# Patient Record
Sex: Female | Born: 1970
Health system: Southern US, Community
[De-identification: ages and names within clinical notes are randomized; demographics above are authoritative.]

## PROBLEM LIST (undated history)

## (undated) DIAGNOSIS — K219 Gastro-esophageal reflux disease without esophagitis: Secondary | ICD-10-CM

## (undated) DIAGNOSIS — H269 Unspecified cataract: Secondary | ICD-10-CM

## (undated) DIAGNOSIS — I10 Essential (primary) hypertension: Secondary | ICD-10-CM

## (undated) DIAGNOSIS — N186 End stage renal disease: Secondary | ICD-10-CM

## (undated) DIAGNOSIS — I639 Cerebral infarction, unspecified: Secondary | ICD-10-CM

## (undated) DIAGNOSIS — M1990 Unspecified osteoarthritis, unspecified site: Secondary | ICD-10-CM

## (undated) DIAGNOSIS — Z992 Dependence on renal dialysis: Secondary | ICD-10-CM

## (undated) DIAGNOSIS — S93402A Sprain of unspecified ligament of left ankle, initial encounter: Secondary | ICD-10-CM

## (undated) DIAGNOSIS — N3000 Acute cystitis without hematuria: Secondary | ICD-10-CM

## (undated) DIAGNOSIS — I2510 Atherosclerotic heart disease of native coronary artery without angina pectoris: Secondary | ICD-10-CM

## (undated) DIAGNOSIS — I255 Ischemic cardiomyopathy: Secondary | ICD-10-CM

## (undated) DIAGNOSIS — E785 Hyperlipidemia, unspecified: Secondary | ICD-10-CM

## (undated) DIAGNOSIS — R29898 Other symptoms and signs involving the musculoskeletal system: Secondary | ICD-10-CM

## (undated) DIAGNOSIS — D649 Anemia, unspecified: Secondary | ICD-10-CM

## (undated) HISTORY — PX: KNEE SURGERY: SHX244

## (undated) HISTORY — PX: EYE SURGERY: SHX253

## (undated) HISTORY — DX: Hyperlipidemia, unspecified: E78.5

## (undated) HISTORY — DX: Sprain of unspecified ligament of left ankle, initial encounter: S93.402A

## (undated) HISTORY — DX: Atherosclerotic heart disease of native coronary artery without angina pectoris: I25.10

## (undated) HISTORY — DX: Other symptoms and signs involving the musculoskeletal system: R29.898

## (undated) HISTORY — DX: Ischemic cardiomyopathy: I25.5

## (undated) HISTORY — PX: TUBAL LIGATION: SHX77

## (undated) HISTORY — DX: Acute cystitis without hematuria: N30.00

## (undated) NOTE — *Deleted (*Deleted)
Called.

---
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
## 1998-02-01 ENCOUNTER — Emergency Department (HOSPITAL_COMMUNITY): Admission: EM | Admit: 1998-02-01 | Discharge: 1998-02-01 | Payer: Self-pay | Admitting: Emergency Medicine

## 2002-11-18 ENCOUNTER — Emergency Department (HOSPITAL_COMMUNITY): Admission: EM | Admit: 2002-11-18 | Discharge: 2002-11-18 | Payer: Self-pay | Admitting: Internal Medicine

## 2002-11-18 ENCOUNTER — Encounter: Payer: Self-pay | Admitting: Internal Medicine

## 2004-11-30 ENCOUNTER — Emergency Department (HOSPITAL_COMMUNITY): Admission: EM | Admit: 2004-11-30 | Discharge: 2004-11-30 | Payer: Self-pay | Admitting: *Deleted

## 2005-01-02 ENCOUNTER — Emergency Department (HOSPITAL_COMMUNITY): Admission: EM | Admit: 2005-01-02 | Discharge: 2005-01-02 | Payer: Self-pay | Admitting: *Deleted

## 2005-01-02 IMAGING — CR DG CHEST 2V
2 series · 2 of 2 positions shown · non-contrast
Comparison: None.

CLINICAL DATA: Chest pain and dyspnea.
 CHEST - 2 VIEW:

[view not recorded (1 of 2)]
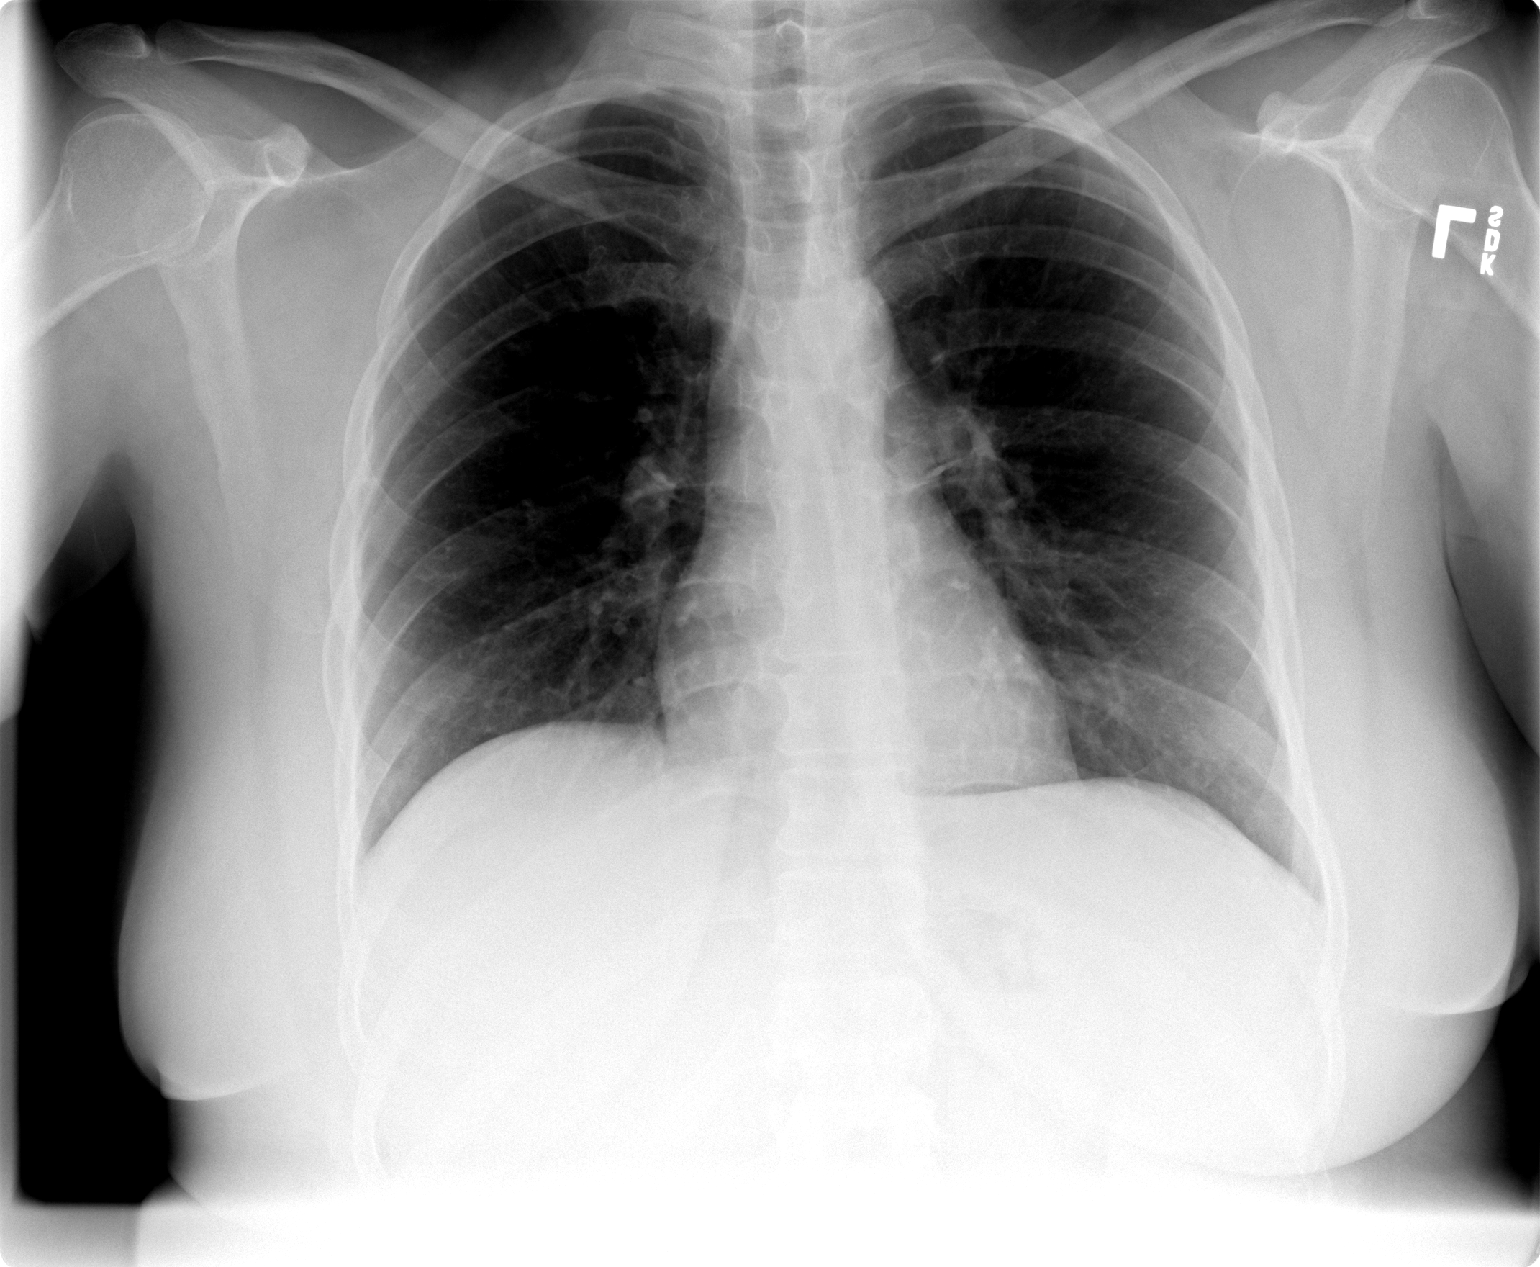

[view not recorded (2 of 2)]
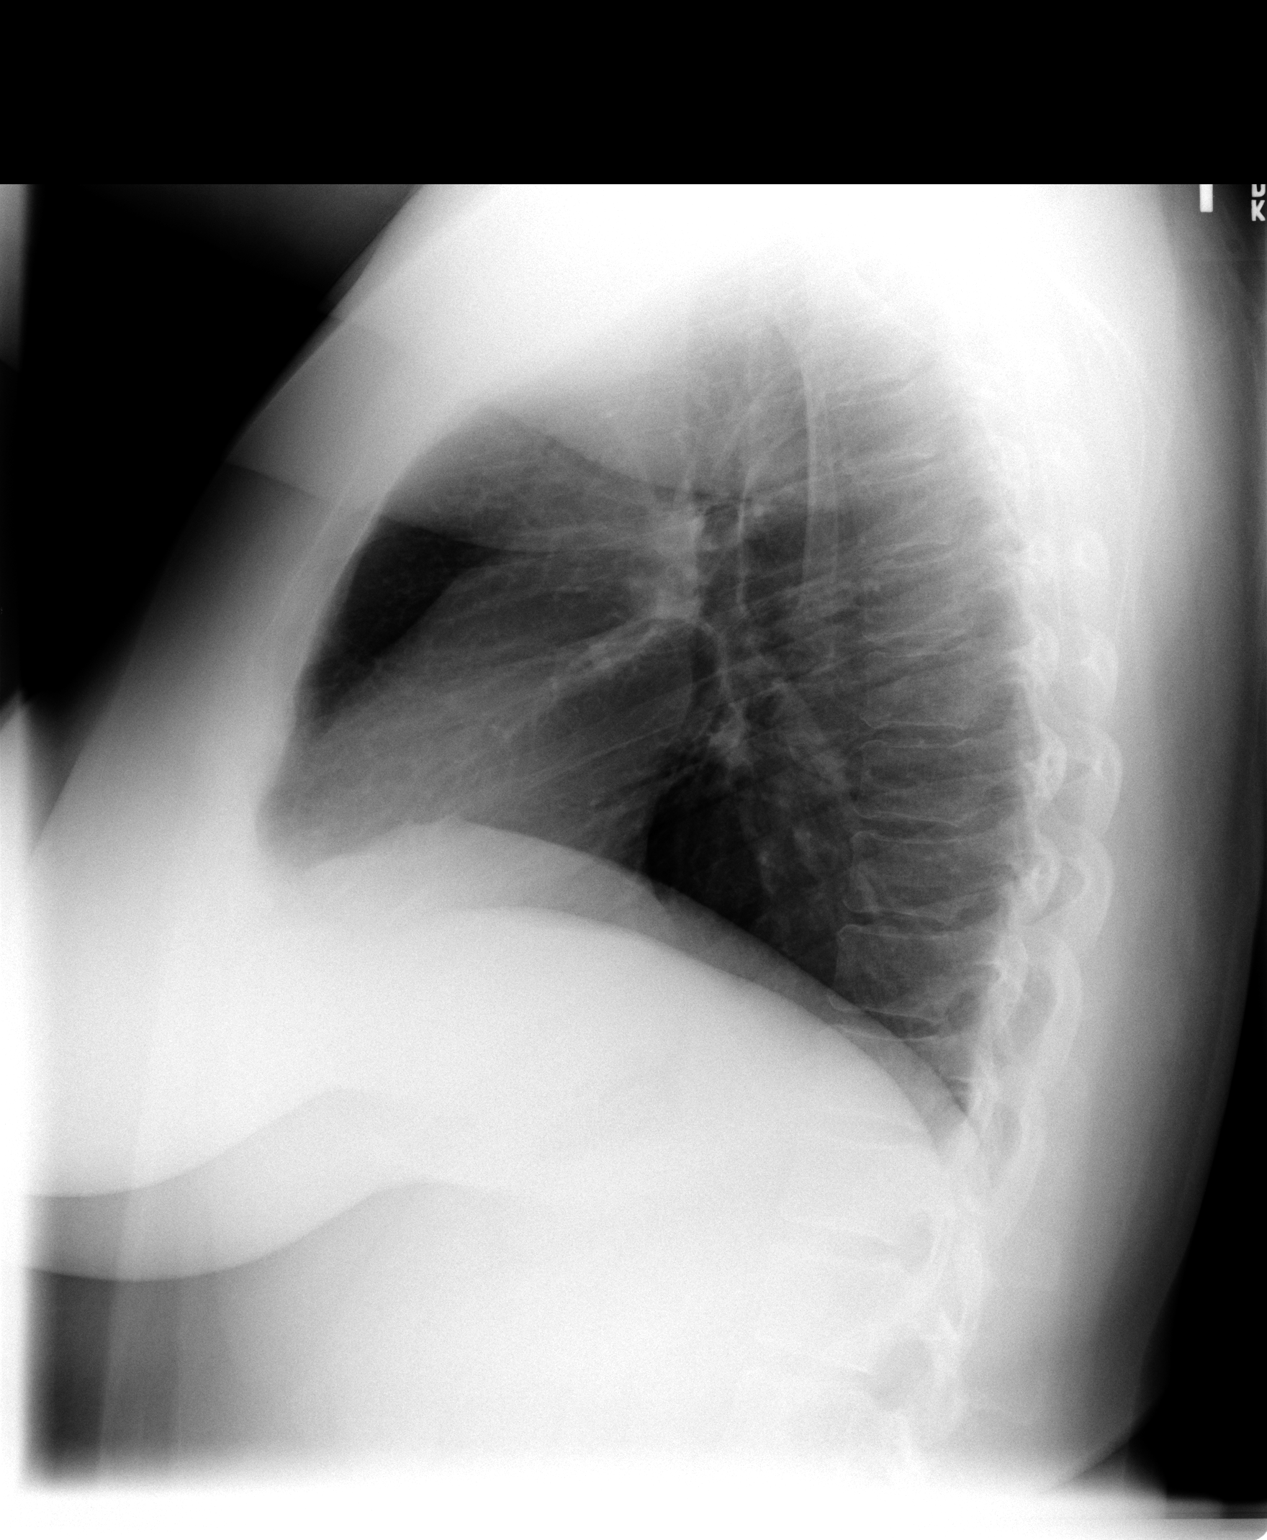

[2 of 2 positions shown; findings below may reference images not displayed]

The cardiomediastinal contours are normal.  The lungs are clear. There is no pleural effusion or pneumothorax.  Osseous structures appear unremarkable.
IMPRESSION: No active cardiopulmonary process demonstrated.

## 2005-03-07 ENCOUNTER — Observation Stay (HOSPITAL_COMMUNITY): Admission: EM | Admit: 2005-03-07 | Discharge: 2005-03-07 | Payer: Self-pay | Admitting: Emergency Medicine

## 2005-04-02 ENCOUNTER — Ambulatory Visit (HOSPITAL_COMMUNITY): Admission: RE | Admit: 2005-04-02 | Discharge: 2005-04-02 | Payer: Self-pay | Admitting: Family Medicine

## 2005-04-02 IMAGING — US US ABDOMEN COMPLETE
1 series · 14 of 25 positions shown · non-contrast
Comparison: None.

CLINICAL DATA: Abnormal liver enzymes.
 ABDOMEN ULTRASOUND:
TECHNIQUE: Complete abdominal ultrasound examination was performed including evaluation of the liver, gallbladder, bile ducts, pancreas, kidneys, spleen, IVC, and abdominal aorta.

[Series 1: unknown · 0.33mm/px · 14 of 61 slices shown]
[im 1/61]
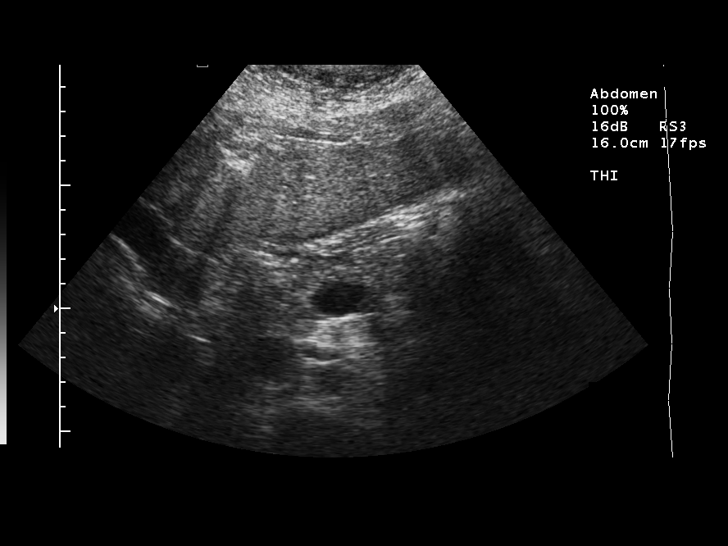
[im 6/61]
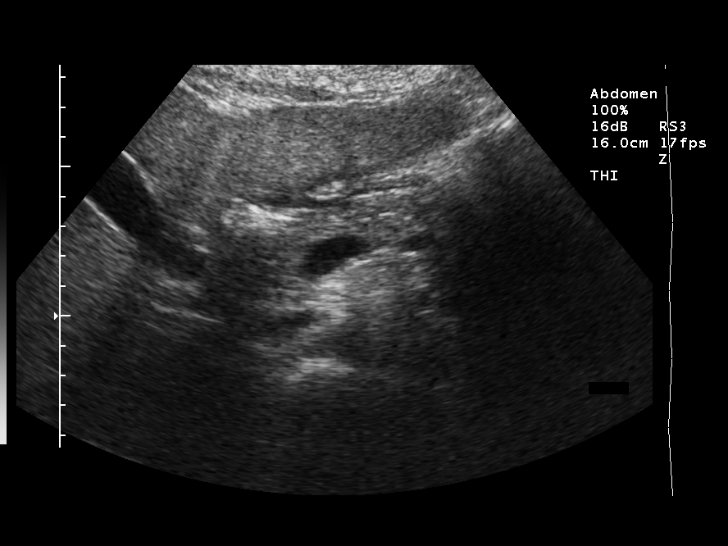
[im 11/61]
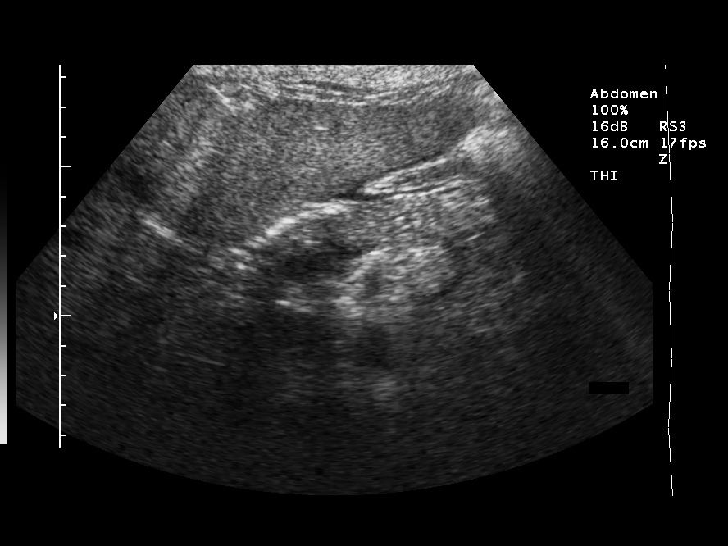
[im 16/61]
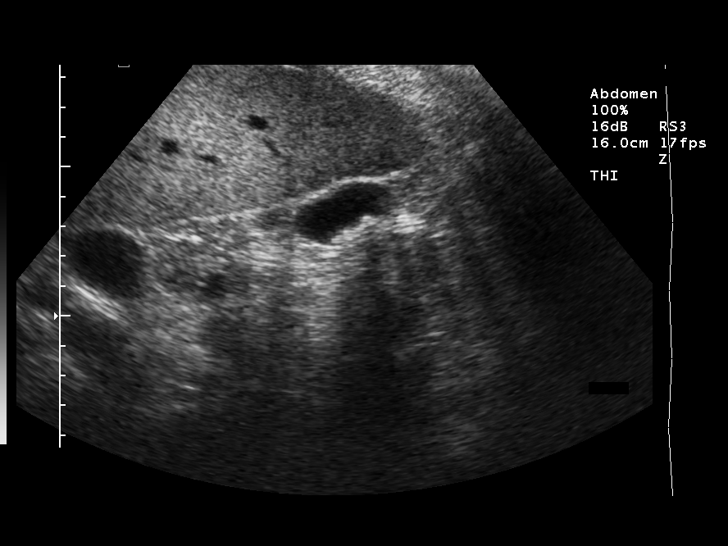
[im 21/61]
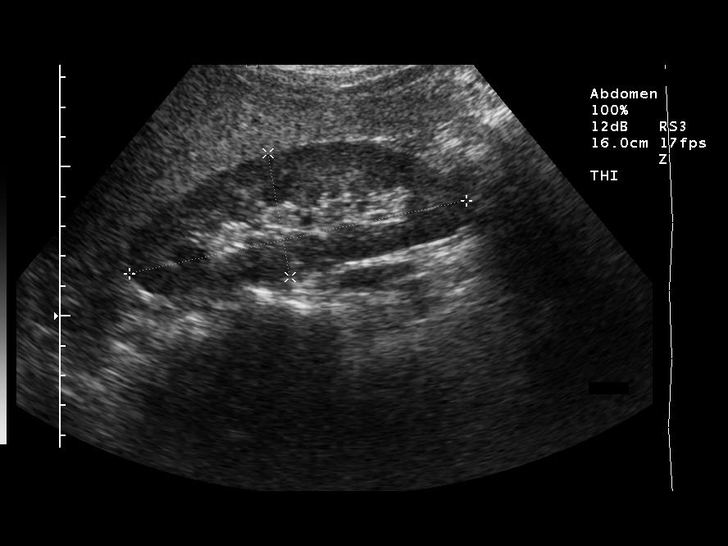
[im 23/61]
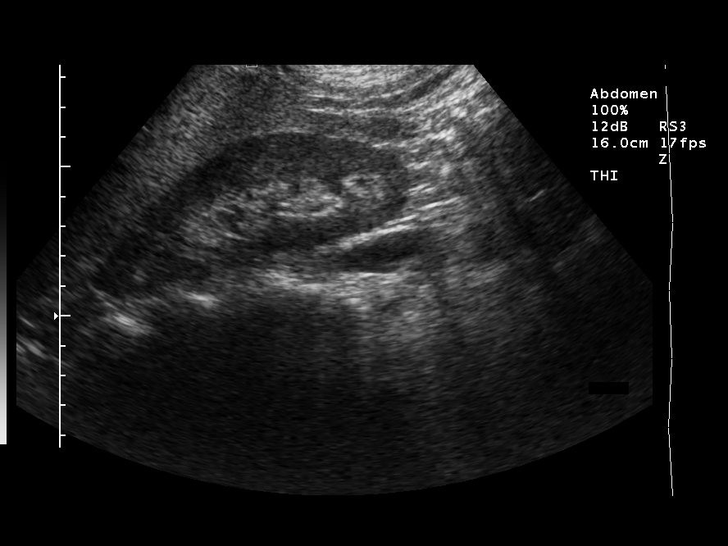
[im 28/61]
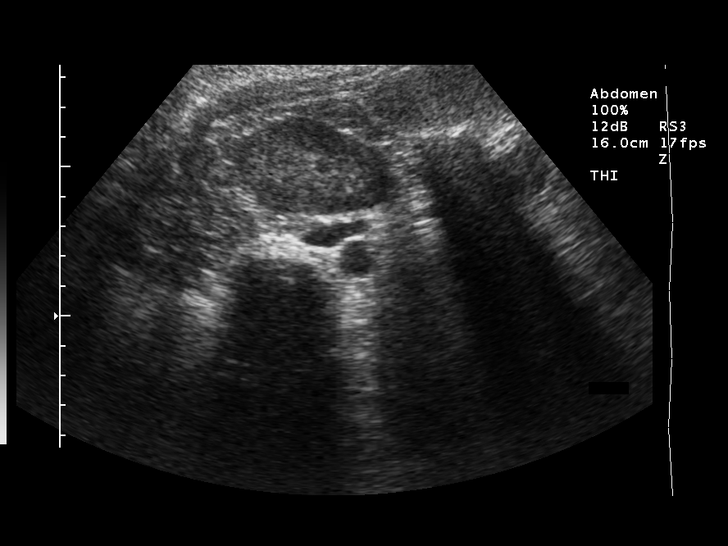
[im 33/61]
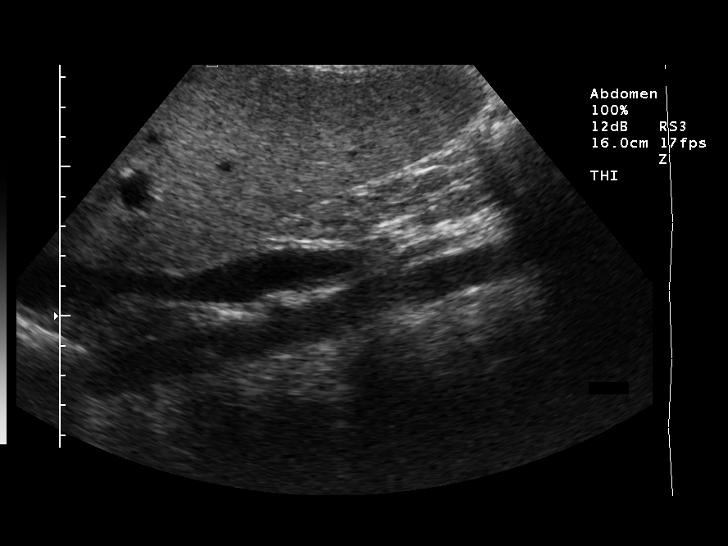
[im 38/61]
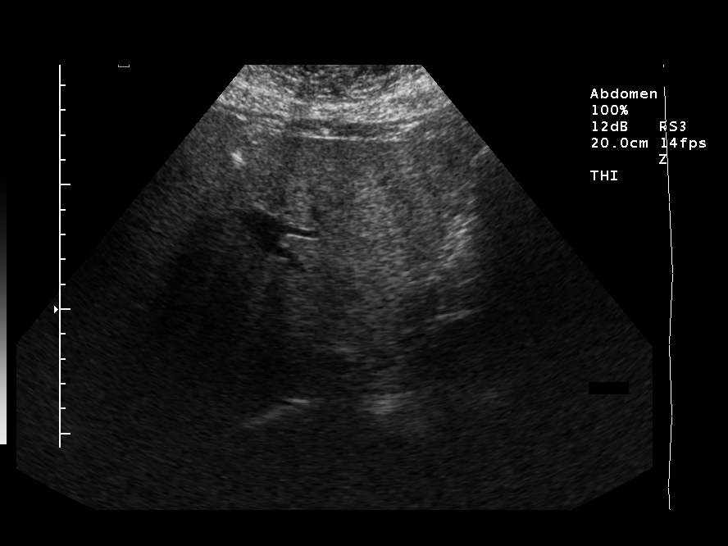
[im 41/61]
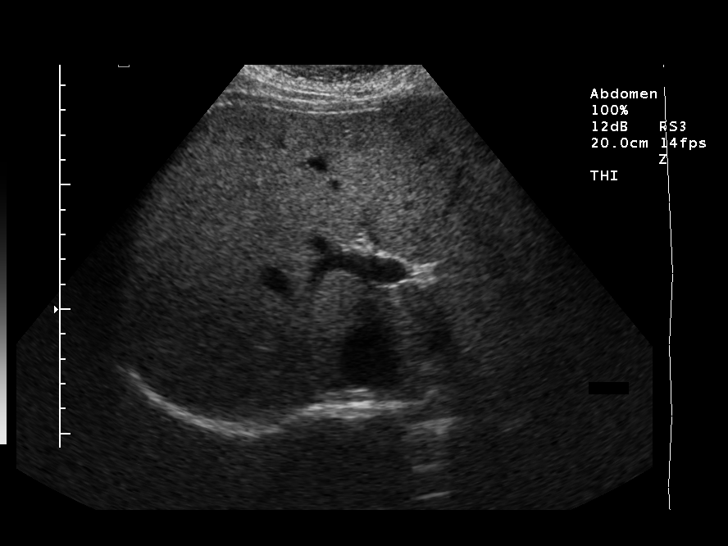
[im 46/61]
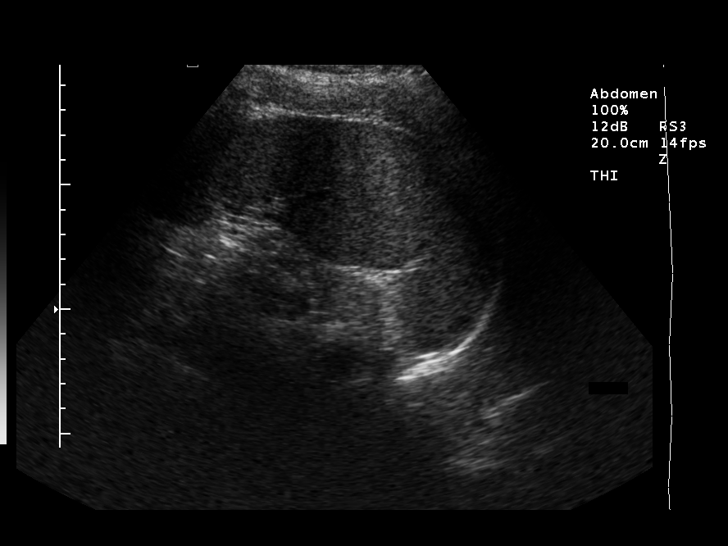
[im 51/61]
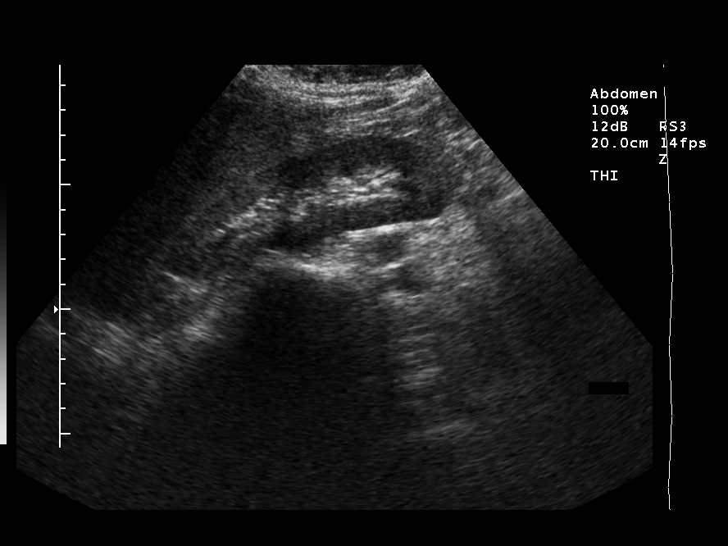
[im 56/61]
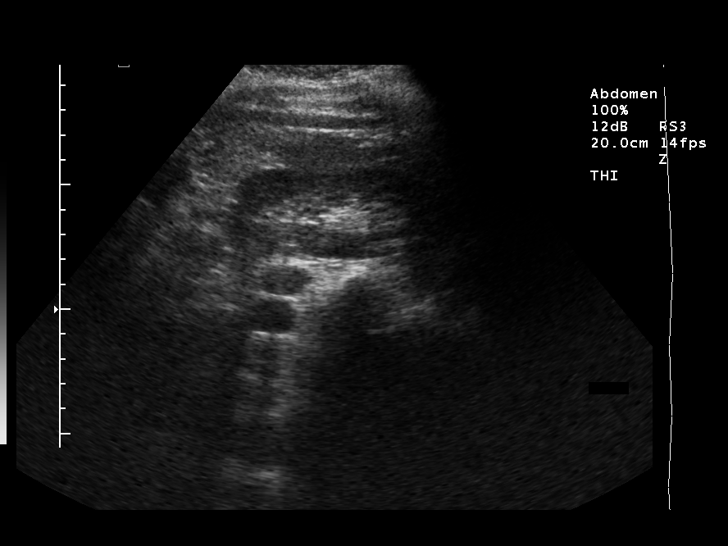
[im 61/61]
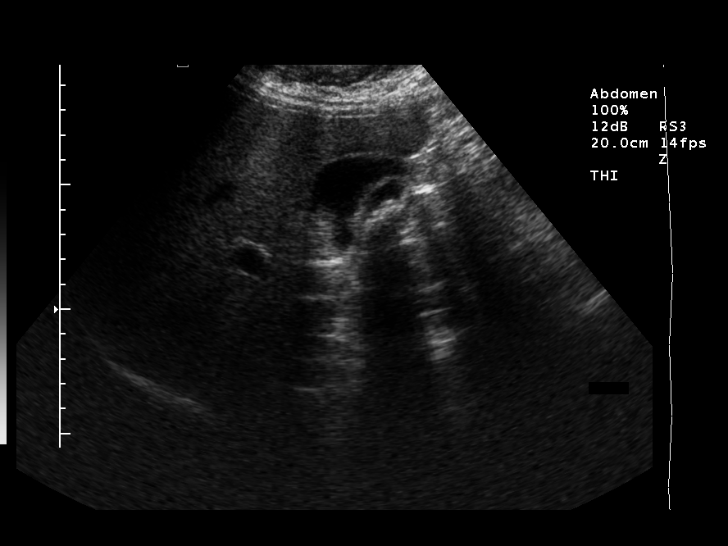

[14 of 25 positions shown; findings below may reference images not displayed]

FINDINGS: Pancreas is uniform in echogenicity.  Mobile shadowing and echogenic foci are seen within the gallbladder without evidence of gallbladder wall thickening, pericholecystic fluid or sonographic Murphy?s sign.  Extrahepatic bile duct measures 4 mm.
 The liver is slightly increased in echogenicity diffusely without intra or extrahepatic biliary duct dilatation.  No focal lesions.  Right kidney measures 11.5 cm and left kidney measures 11.9 cm. Parenchymal echogenicity is uniform and there is no evidence of mass, stone or hydronephrosis.
 Spleen is slightly enlarged at 13.7 cm.  Aorta tapers normally.  IVC is unremarkable.
IMPRESSION: 1.  Fatty liver.
 2.  Cholelithiasis without evidence of acute cholecystitis.
 3.  Splenomegaly.

## 2005-08-22 ENCOUNTER — Emergency Department (HOSPITAL_COMMUNITY): Admission: EM | Admit: 2005-08-22 | Discharge: 2005-08-22 | Payer: Self-pay | Admitting: Emergency Medicine
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
## 2007-07-02 ENCOUNTER — Ambulatory Visit: Payer: Self-pay | Admitting: Nurse Practitioner

## 2007-07-02 DIAGNOSIS — E1121 Type 2 diabetes mellitus with diabetic nephropathy: Secondary | ICD-10-CM | POA: Insufficient documentation

## 2007-07-02 DIAGNOSIS — H269 Unspecified cataract: Secondary | ICD-10-CM

## 2007-07-02 DIAGNOSIS — E1165 Type 2 diabetes mellitus with hyperglycemia: Secondary | ICD-10-CM

## 2007-07-02 HISTORY — DX: Unspecified cataract: H26.9

## 2007-07-02 LAB — CONVERTED CEMR LAB
Albumin: 4.3 g/dL (ref 3.5–5.2)
Bilirubin Urine: NEGATIVE
Blood in Urine, dipstick: NEGATIVE
CO2: 23 meq/L (ref 19–32)
Calcium: 9.4 mg/dL (ref 8.4–10.5)
Eosinophils Relative: 3 % (ref 0–5)
Glucose, Bld: 322 mg/dL — ABNORMAL HIGH (ref 70–99)
Glucose, Urine, Semiquant: >=1000
HCT: 38.3 % (ref 36.0–46.0)
Ketones, urine, test strip: NEGATIVE
Lymphocytes Relative: 34 % (ref 12–46)
Lymphs Abs: 2.8 10*3/uL (ref 0.7–4.0)
Platelets: 242 10*3/uL (ref 150–400)
Potassium: 4 meq/L (ref 3.5–5.3)
Protein, U semiquant: NEGATIVE
Sodium: 136 meq/L (ref 135–145)
TSH: 4.142 microintl units/mL (ref 0.350–5.50)
Total Protein: 7.2 g/dL (ref 6.0–8.3)
WBC: 8.2 10*3/uL (ref 4.0–10.5)
pH: 5.5

## 2007-07-07 ENCOUNTER — Encounter (INDEPENDENT_AMBULATORY_CARE_PROVIDER_SITE_OTHER): Payer: Self-pay | Admitting: Nurse Practitioner

## 2007-07-08 ENCOUNTER — Encounter (INDEPENDENT_AMBULATORY_CARE_PROVIDER_SITE_OTHER): Payer: Self-pay | Admitting: Nurse Practitioner

## 2007-08-04 ENCOUNTER — Ambulatory Visit: Payer: Self-pay | Admitting: Nurse Practitioner

## 2007-08-04 LAB — CONVERTED CEMR LAB: Blood Glucose, Fingerstick: 239

## 2007-08-19 ENCOUNTER — Ambulatory Visit: Payer: Self-pay | Admitting: Nurse Practitioner

## 2007-08-20 ENCOUNTER — Ambulatory Visit: Payer: Self-pay | Admitting: *Deleted

## 2007-08-27 ENCOUNTER — Encounter (INDEPENDENT_AMBULATORY_CARE_PROVIDER_SITE_OTHER): Payer: Self-pay | Admitting: Nurse Practitioner

## 2008-03-23 ENCOUNTER — Encounter (INDEPENDENT_AMBULATORY_CARE_PROVIDER_SITE_OTHER): Payer: Self-pay | Admitting: *Deleted

## 2008-03-30 ENCOUNTER — Emergency Department (HOSPITAL_COMMUNITY): Admission: EM | Admit: 2008-03-30 | Discharge: 2008-03-30 | Payer: Self-pay | Admitting: Emergency Medicine

## 2008-03-30 ENCOUNTER — Telehealth (INDEPENDENT_AMBULATORY_CARE_PROVIDER_SITE_OTHER): Payer: Self-pay | Admitting: Nurse Practitioner

## 2008-03-31 ENCOUNTER — Ambulatory Visit: Payer: Self-pay | Admitting: Nurse Practitioner

## 2008-03-31 DIAGNOSIS — K047 Periapical abscess without sinus: Secondary | ICD-10-CM

## 2008-04-02 ENCOUNTER — Telehealth (INDEPENDENT_AMBULATORY_CARE_PROVIDER_SITE_OTHER): Payer: Self-pay | Admitting: Nurse Practitioner

## 2008-04-05 ENCOUNTER — Ambulatory Visit: Payer: Self-pay | Admitting: Nurse Practitioner

## 2008-04-15 ENCOUNTER — Telehealth (INDEPENDENT_AMBULATORY_CARE_PROVIDER_SITE_OTHER): Payer: Self-pay | Admitting: Nurse Practitioner

## 2008-06-20 ENCOUNTER — Inpatient Hospital Stay (HOSPITAL_COMMUNITY): Admission: EM | Admit: 2008-06-20 | Discharge: 2008-07-02 | Payer: Self-pay | Admitting: Internal Medicine

## 2008-06-20 IMAGING — CT CT ANGIO CHEST
2 of 5 series · 19 of 36 positions shown · IV contrast (APPLIED)
Comparison: Chest radiographs done today.

CLINICAL DATA: Midsternal chest pain and vomiting.  Question
pulmonary embolism.

CT ANGIOGRAPHY CHEST
TECHNIQUE: Multidetector CT imaging of the chest using the
standard protocol during bolus administration of intravenous
contrast. Multiplanar reconstructed images including MIPs were
obtained and reviewed to evaluate the vascular anatomy.
Contrast: 100 ml [5X] intravenously.

[Series 8: pulm embolism 1.0 b25f thins · axial · 0.57mm/px · z∈[+3,+223]mm · 16 of 246 slices shown]
[im 13/246  lung]
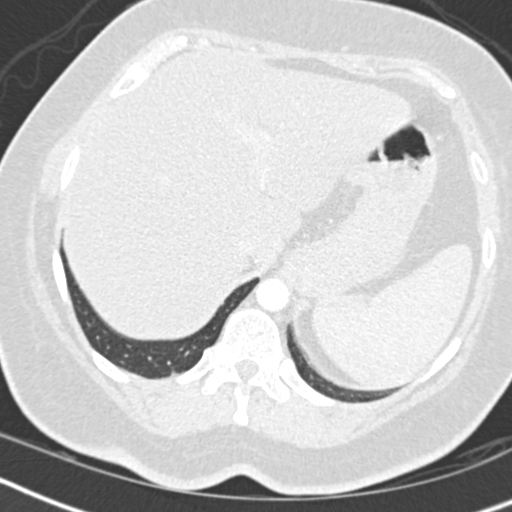
[im 25/246  mediastinal]
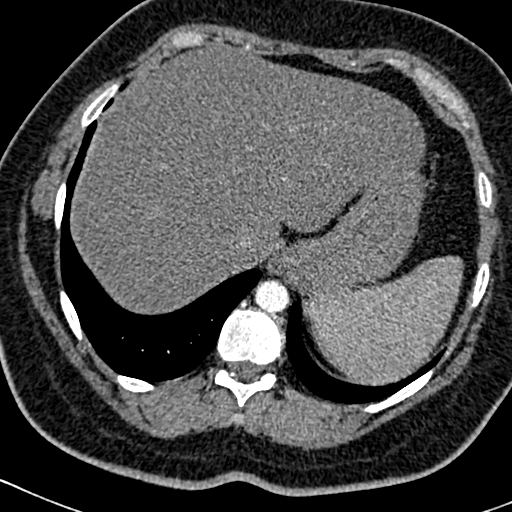
[im 37/246  lung]
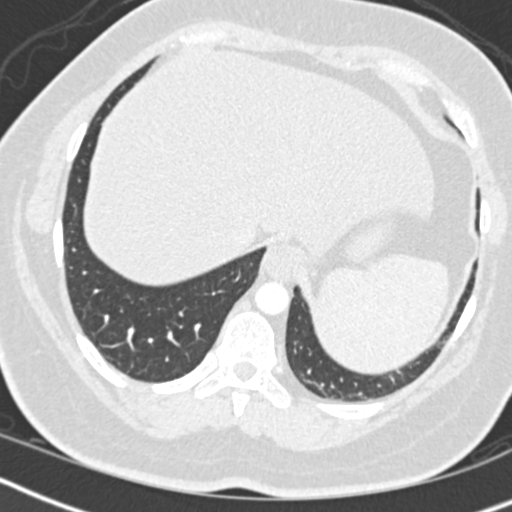
[im 62/246  mediastinal]
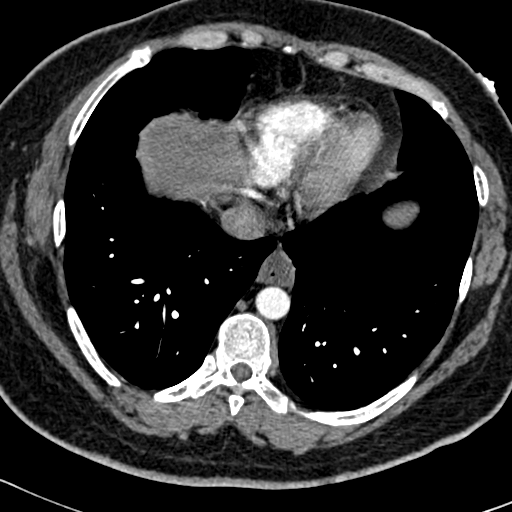
[im 74/246  lung]
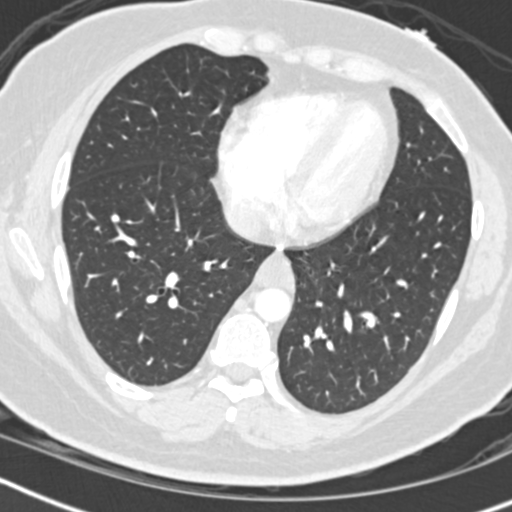
[im 86/246  mediastinal]
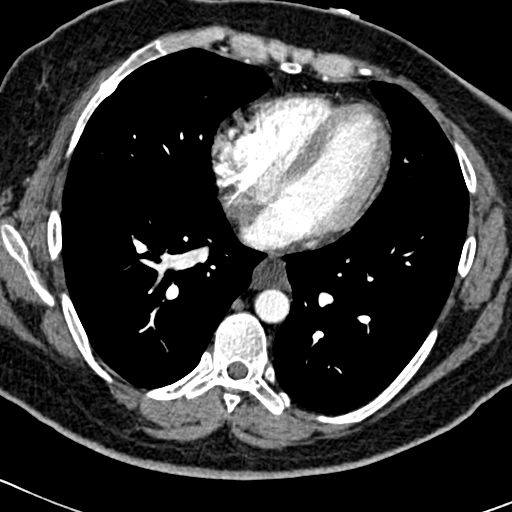
[im 99/246  lung]
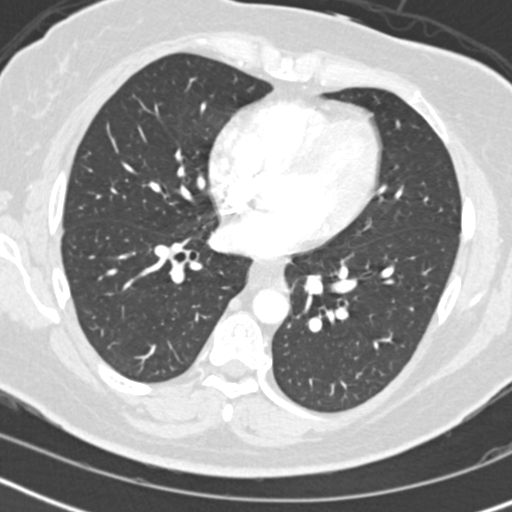
[im 111/246  mediastinal]
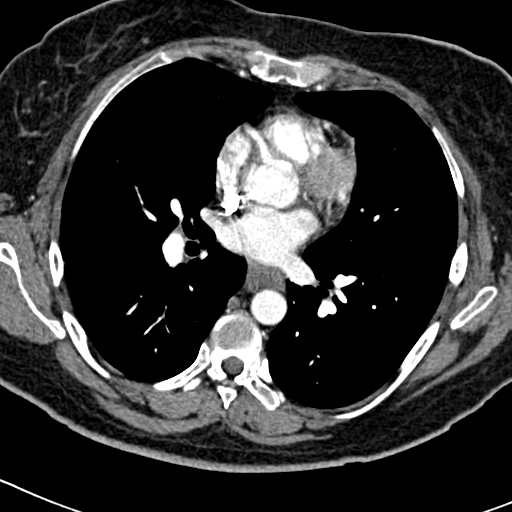
[im 135/246  lung]
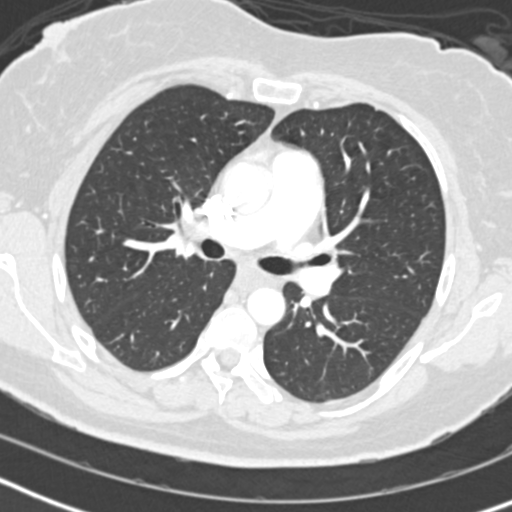
[im 148/246  mediastinal]
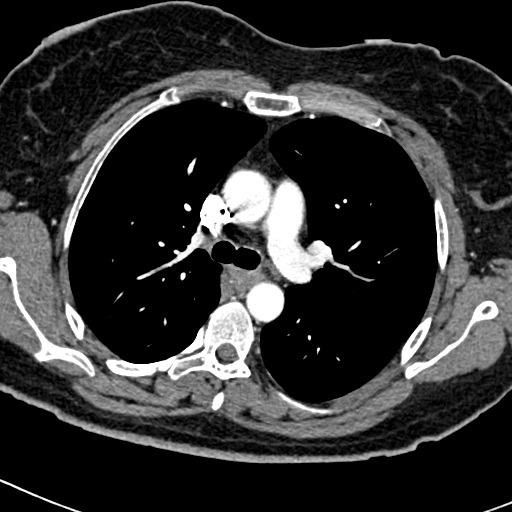
[im 160/246  lung]
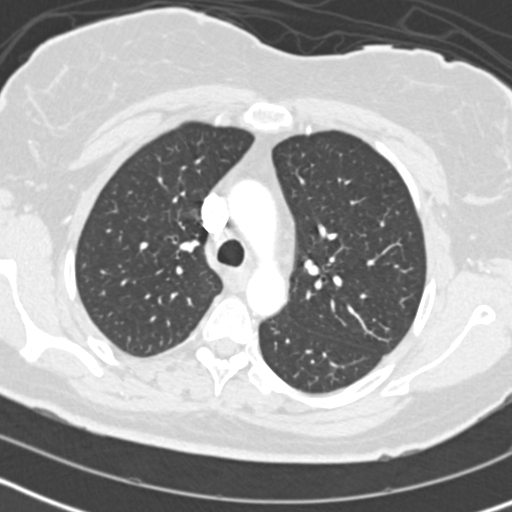
[im 172/246  mediastinal]
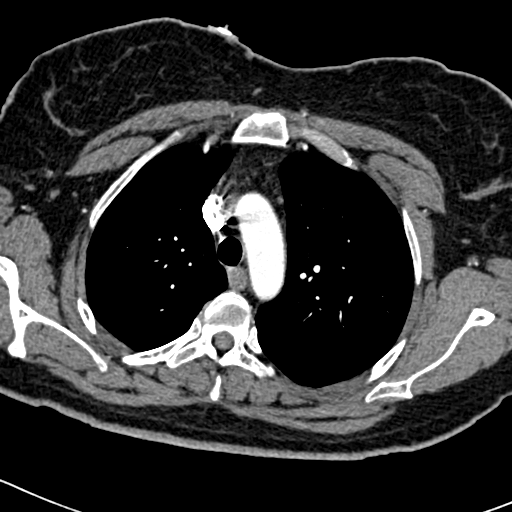
[im 184/246  lung]
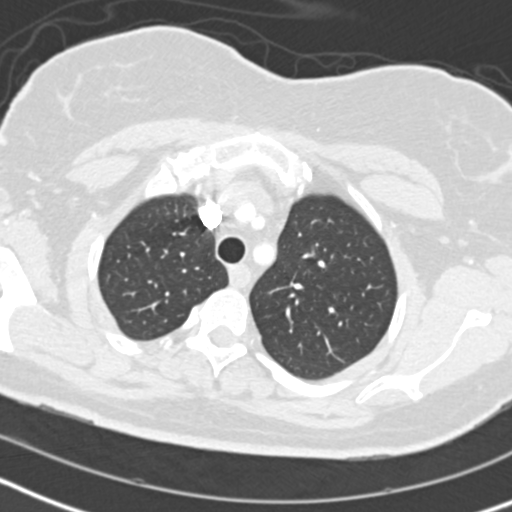
[im 209/246  mediastinal]
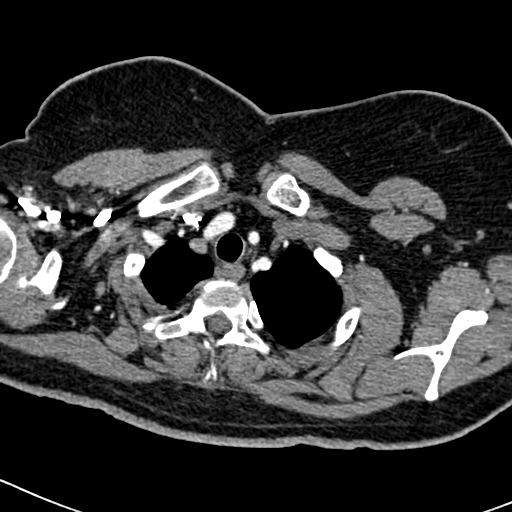
[im 221/246  lung]
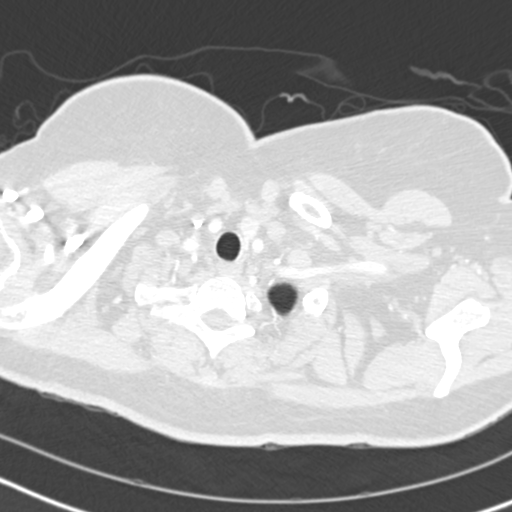
[im 233/246  mediastinal]
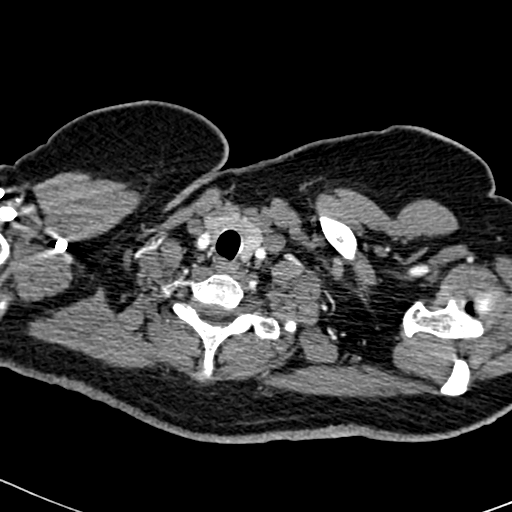

[Series 602: cor · coronal · 0.57mm/px · 3 of 109 slices shown]
[im 22/109  mediastinal]
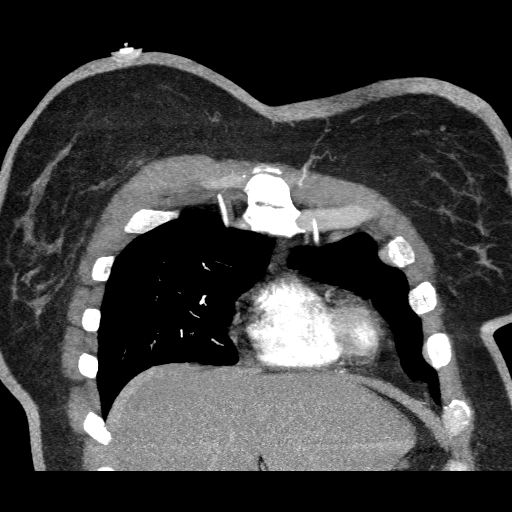
[im 44/109  mediastinal]
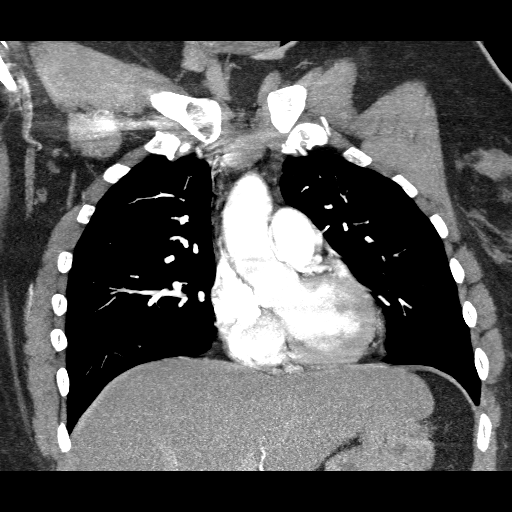
[im 65/109  mediastinal]
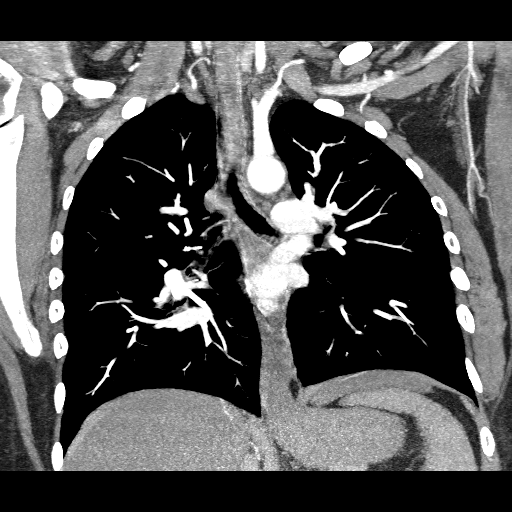

[19 of 36 positions shown; findings below may reference images not displayed]

FINDINGS: The pulmonary arteries are well opacified with contrast.
There is no evidence of acute pulmonary embolism.  The thoracic
aorta appears normal.  There are no enlarged mediastinal or hilar
lymph nodes.  There is a small hiatal hernia.  There appears to be
thickening of the walls of the esophagus suggesting possible
esophagitis - correlate clinically.

There is no pleural or pericardial effusion.  The lungs are clear.
Old fractures of the right fifth and sixth ribs are noted
anteriorly.

Images through the upper abdomen demonstrate fatty infiltration of
the liver.
IMPRESSION: 1.  No evidence of acute pulmonary embolism.
2.  Question esophagitis.  Correlate clinically.
3.  Hepatic steatosis.
4.  Old rib fractures on the right.

## 2008-06-20 IMAGING — CR DG CHEST 1V PORT
1 series · 1 of 1 positions shown · non-contrast
Comparison: [DATE].

CLINICAL DATA: Chest pain and vomiting.

PORTABLE CHEST - 1 VIEW

[view not recorded]
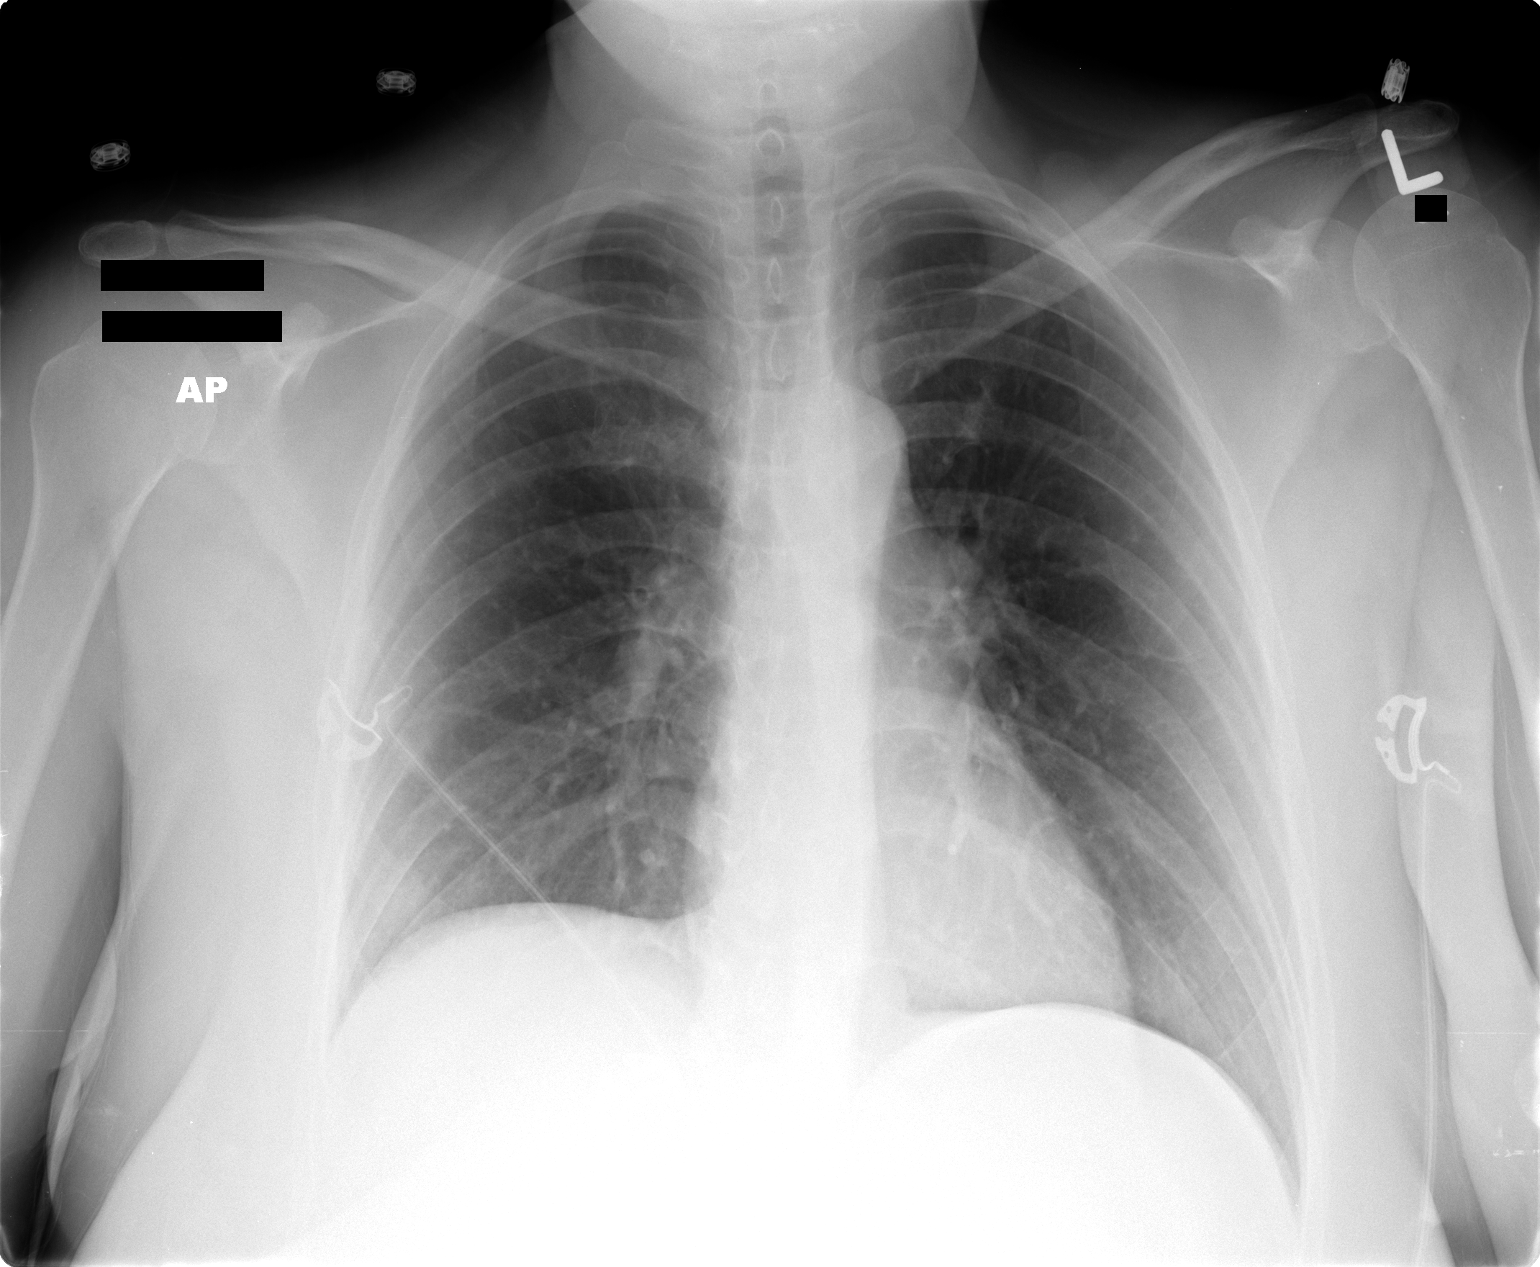

[1 of 1 positions shown; findings below may reference images not displayed]

FINDINGS: [YG] hours.  The heart size and mediastinal contours are
stable.  The lungs are clear.  There is no pleural effusion or
pneumothorax.  The osseous structures appear normal.
IMPRESSION: No active cardiopulmonary process.

## 2008-06-26 IMAGING — US US RENAL
1 series · 14 of 25 positions shown · non-contrast
Comparison: None

CLINICAL DATA: Diabetic ketoacidosis.  Renal insufficiency.
Nephritis.

RENAL/URINARY TRACT ULTRASOUND
TECHNIQUE: Complete ultrasound examination of the urinary tract
was performed including evaluation of the kidneys, renal collecting
systems, and urinary bladder.

[Series 1: unknown · 0.33mm/px · 14 of 29 slices shown]
[im 1/29]
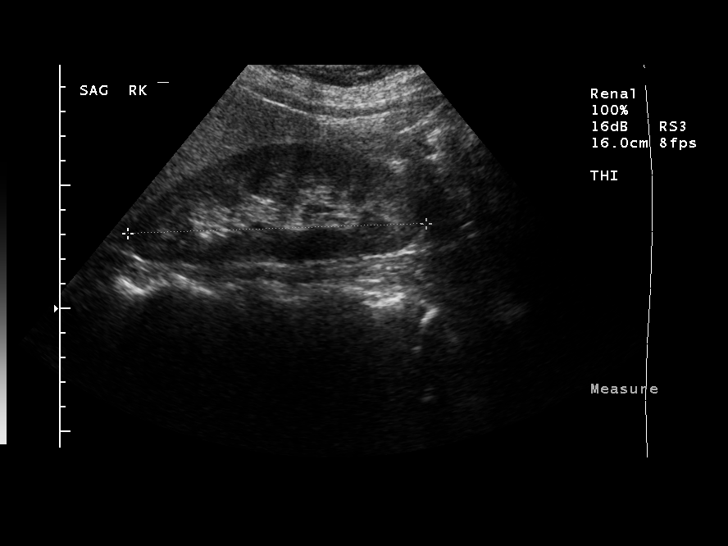
[im 3/29]
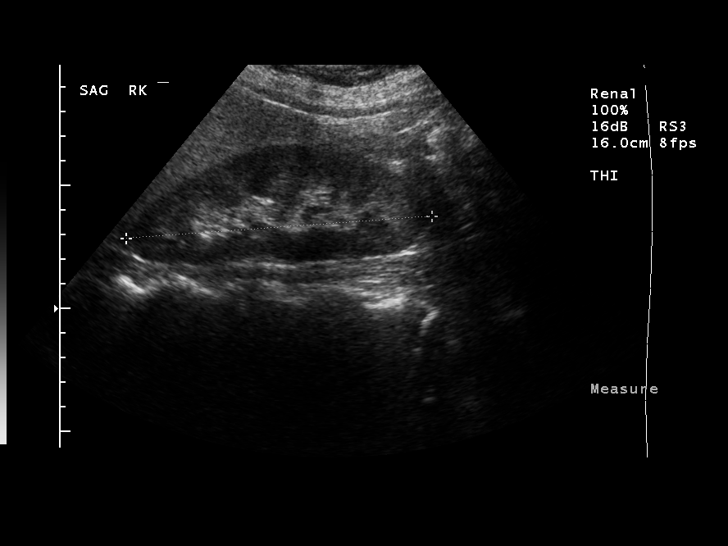
[im 5/29]
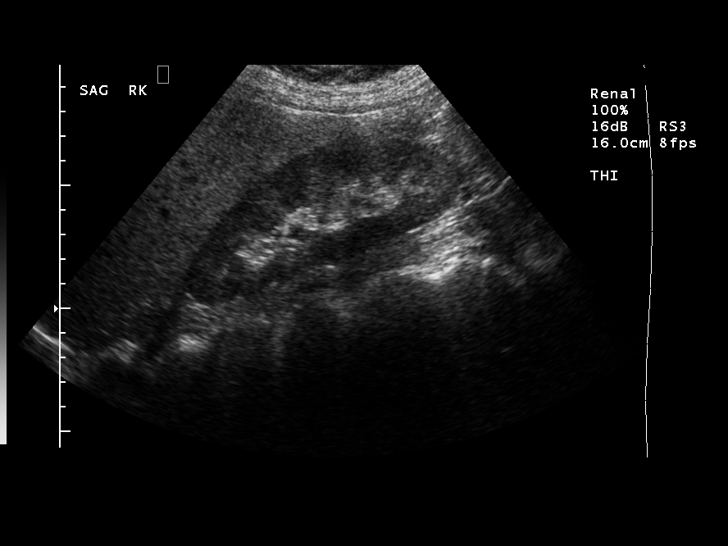
[im 8/29]
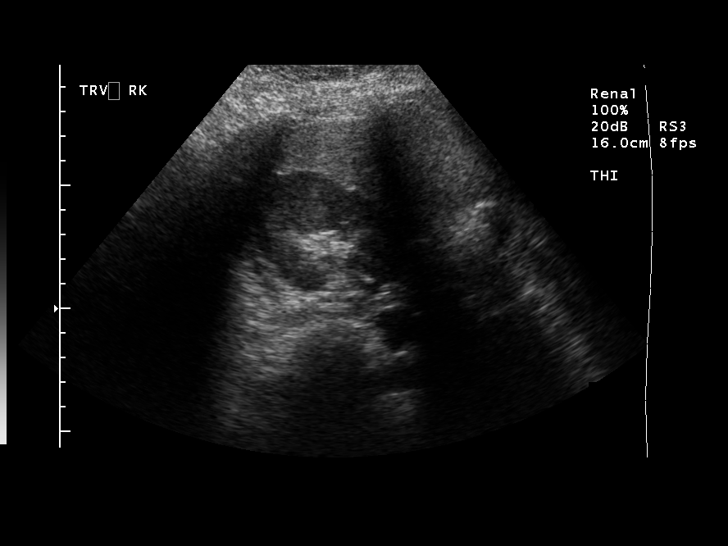
[im 10/29]
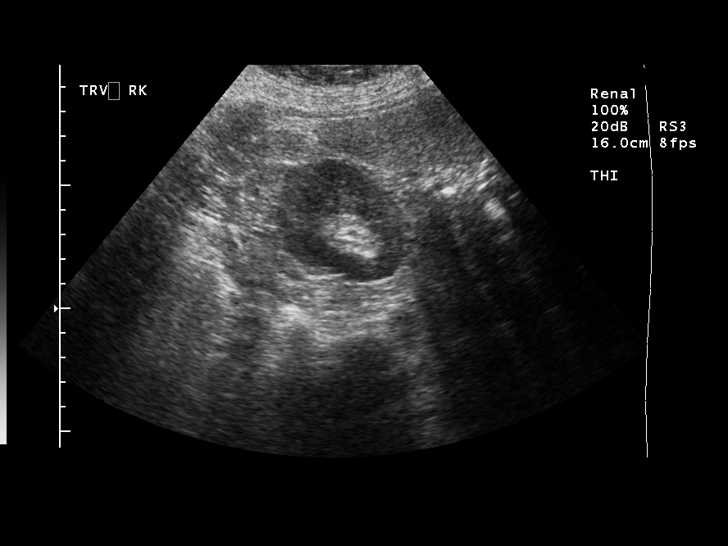
[im 11/29]
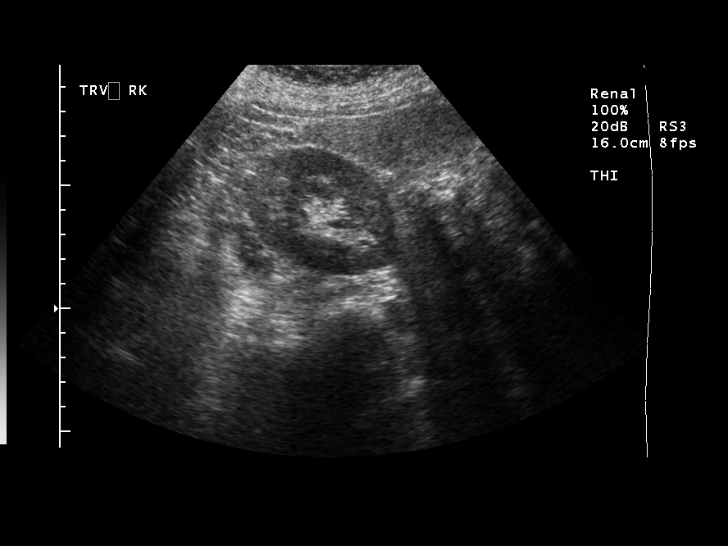
[im 13/29]
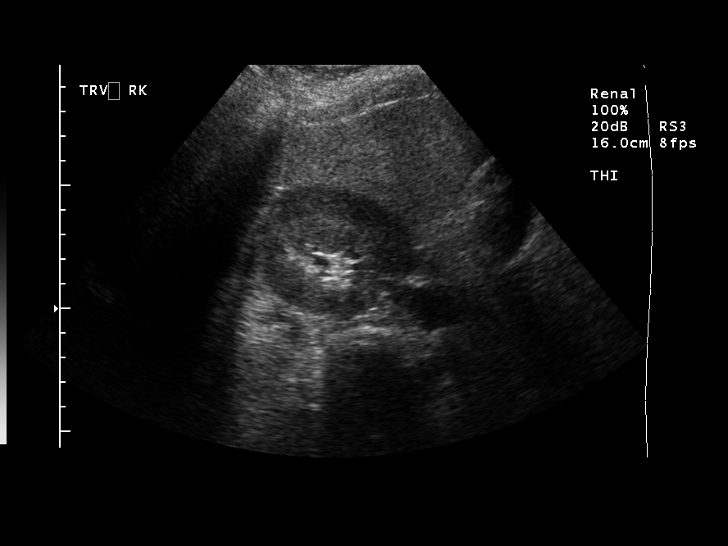
[im 16/29]
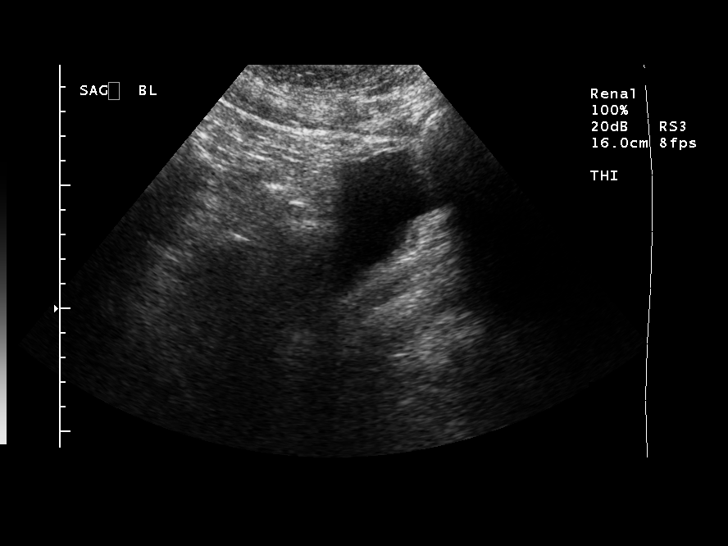
[im 18/29]
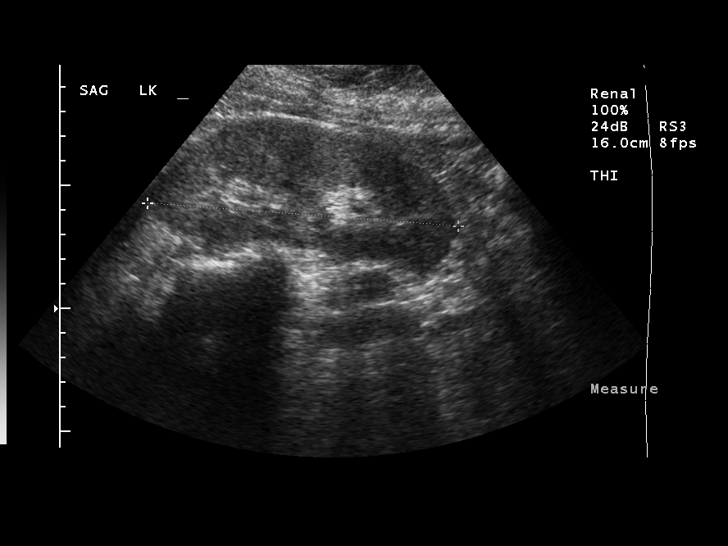
[im 19/29]
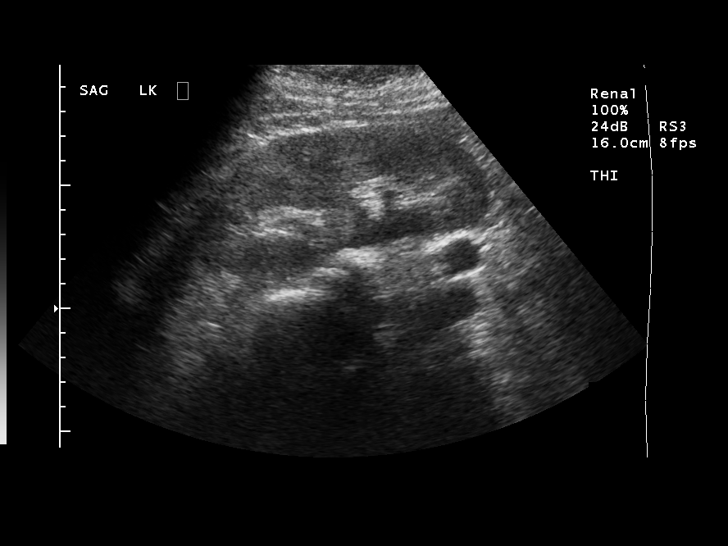
[im 22/29]
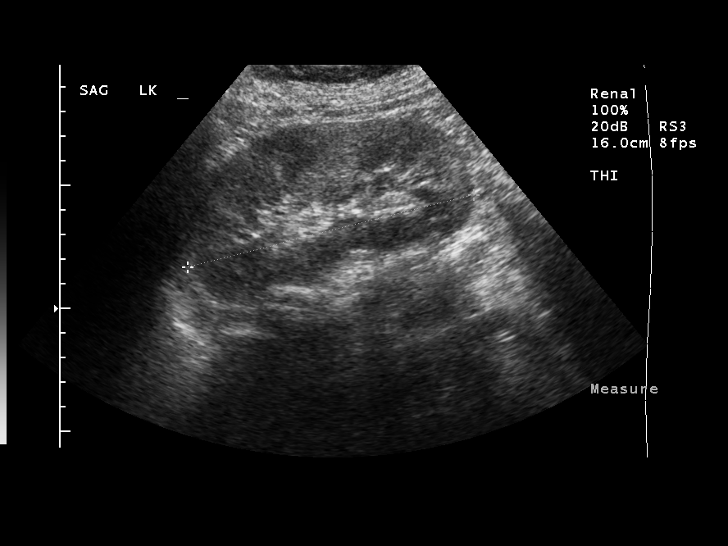
[im 24/29]
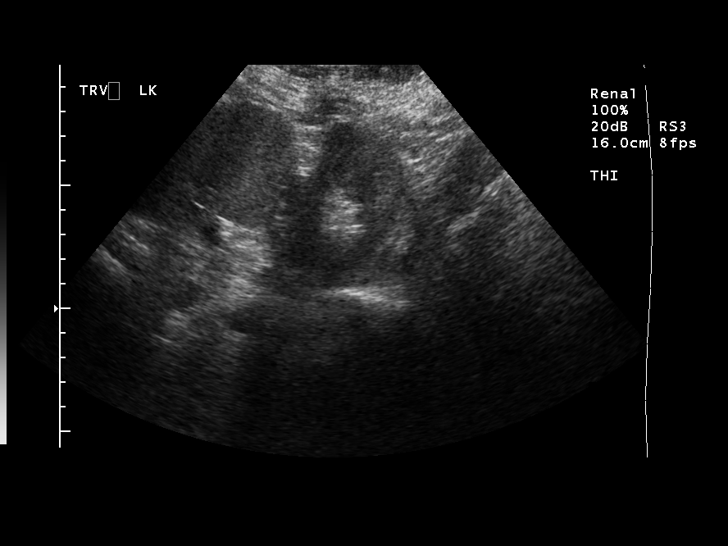
[im 26/29]
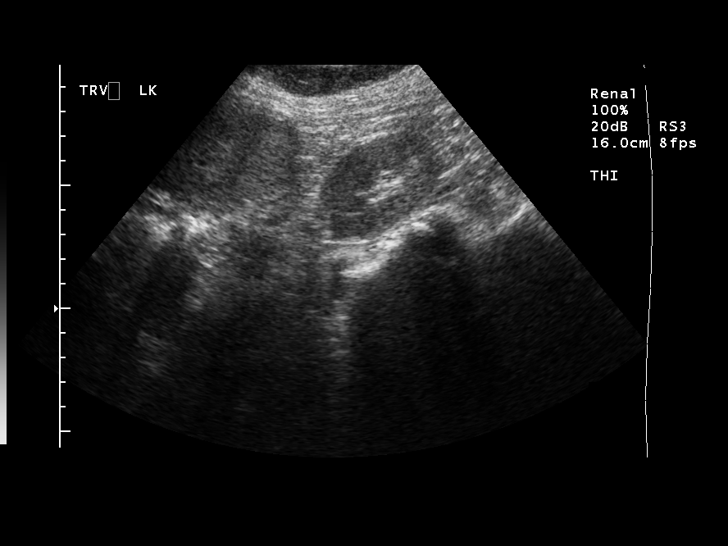
[im 29/29]
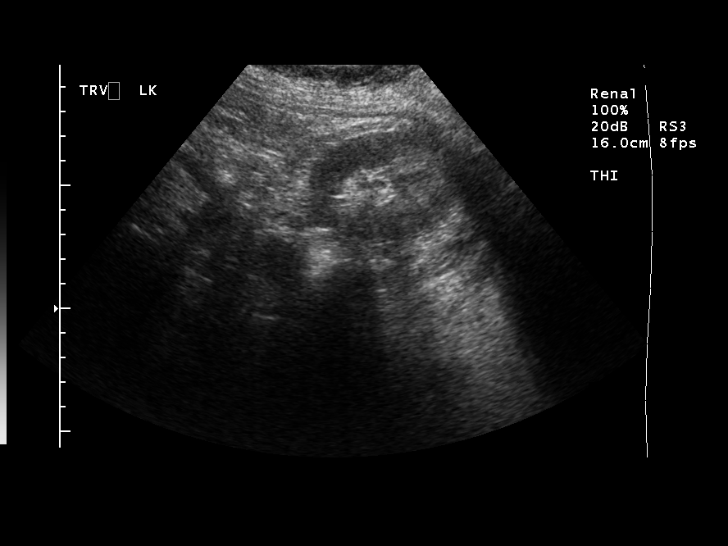

[14 of 25 positions shown; findings below may reference images not displayed]

FINDINGS: Both kidneys are within normal limits in size, with right
kidney measuring 12.4 cm and the left kidney measuring 12.6 cm.
Both kidneys show normal parenchymal echogenicity.  No renal masses
are identified.

The left kidney shows heterogeneous increased echotexture in the
mid and lower poles, which is nonspecific but suspicious for
pyelonephritis, or less likely infarct.  There is no evidence of
hydronephrosis involving either kidney.

The images of the urinary bladder are unremarkable in appearance
for degree of bladder filling.
IMPRESSION: 1.  Focal areas of heterogeneous increased echotexture in the mid
and lower poles of the left kidney.  This finding is nonspecific,
but may be seen with pyelonephritis, or less likely renal infarct.
Clinical correlation is recommended.
2.  No evidence of renal mass or hydronephrosis.

## 2008-06-27 IMAGING — CT CT NECK W/ CM
3 of 5 series · 16 of 33 positions shown, 19 images · IV contrast (100 ML OMNI 300)
Comparison: [DATE]

CLINICAL DATA: Left dental extraction as on [DATE].  Left neck
and mandibular pain with swelling.  Leukocytosis.

CT NECK WITH CONTRAST
TECHNIQUE: Multidetector CT imaging of the neck was performed with
intravenous contrast.
Contrast: 100 ml [P4]

[Series 3: recon 2: 2cc/(id)/1cc/(id) · axial · 0.47mm/px · z∈[+47,+212]mm · 8 of 325 slices shown, 10 images]
[im 30/325  soft-tissue]
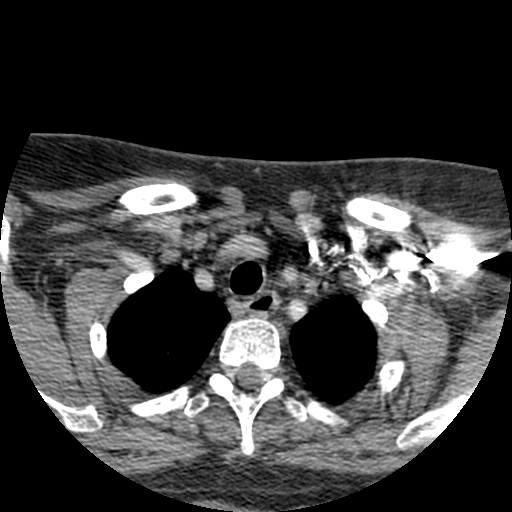
[im 30/325  bone]
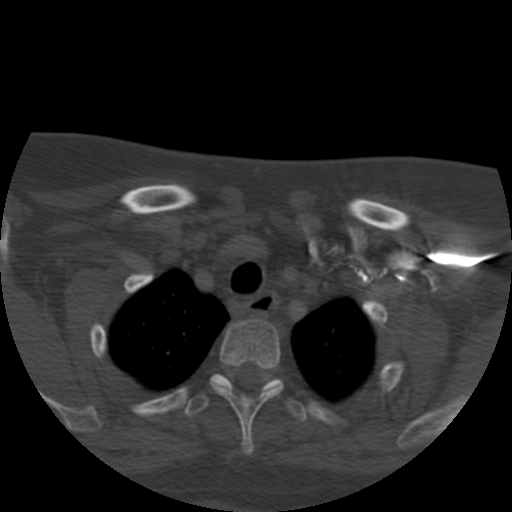
[im 59/325  bone]
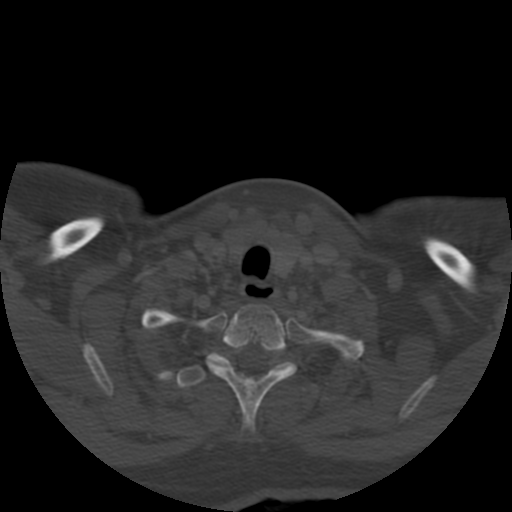
[im 118/325  bone]
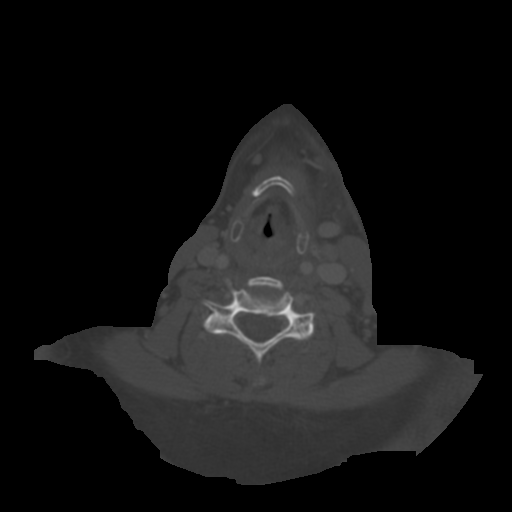
[im 148/325  bone]
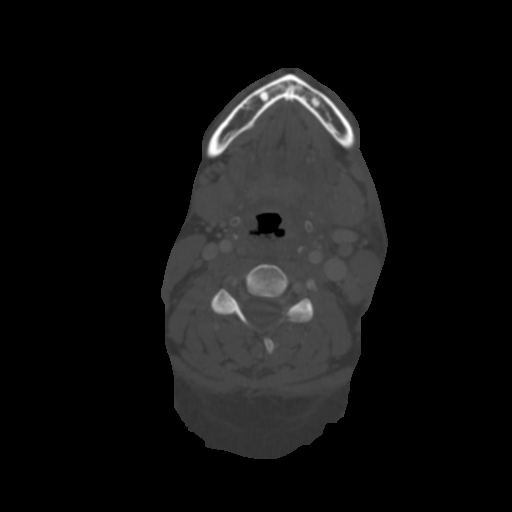
[im 177/325  soft-tissue]
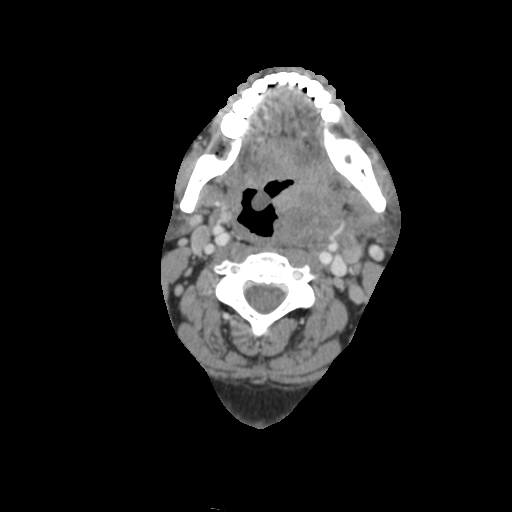
[im 177/325  bone]
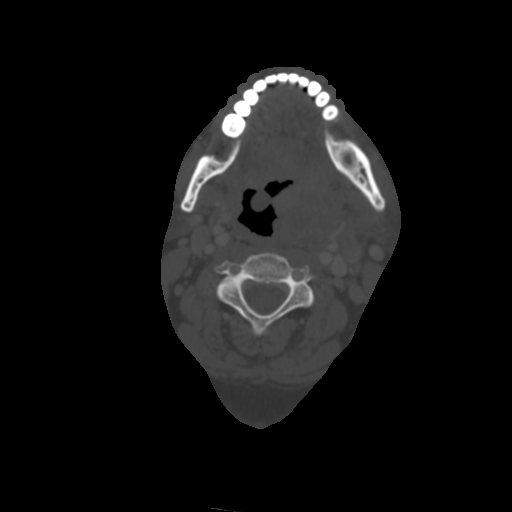
[im 207/325  bone]
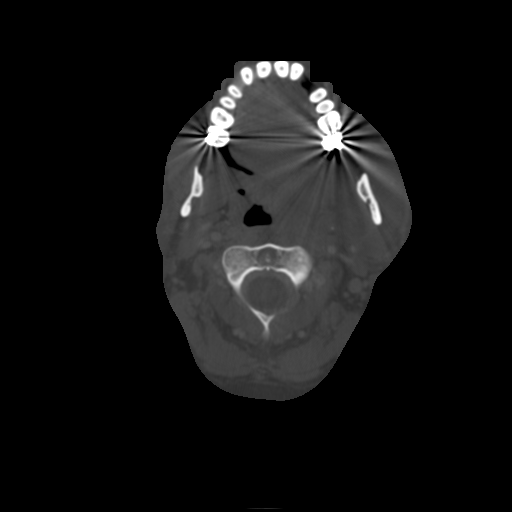
[im 266/325  bone]
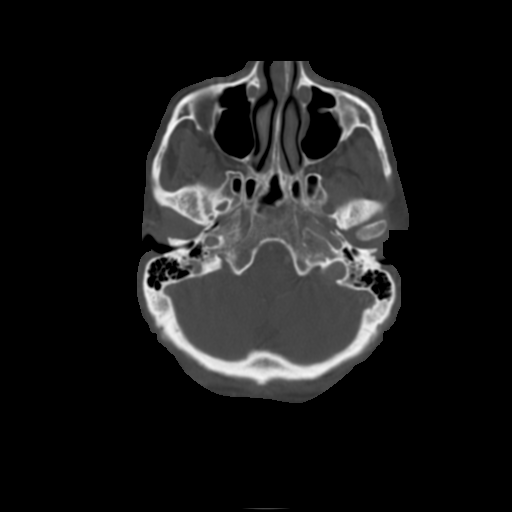
[im 295/325  bone]
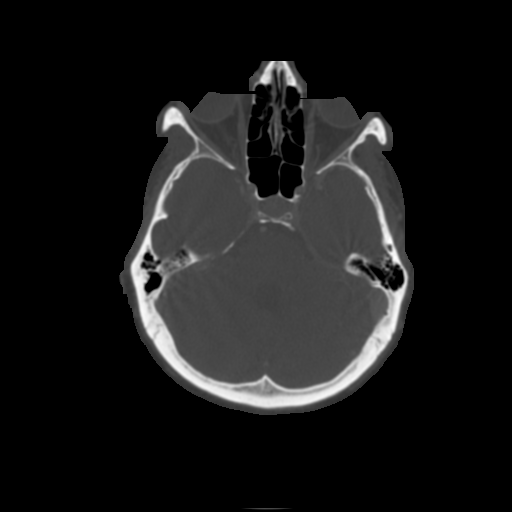

[Series 300: reformatted · coronal · 0.47mm/px · 3 of 64 slices shown (1 of 2)]
[im 13/64  bone]
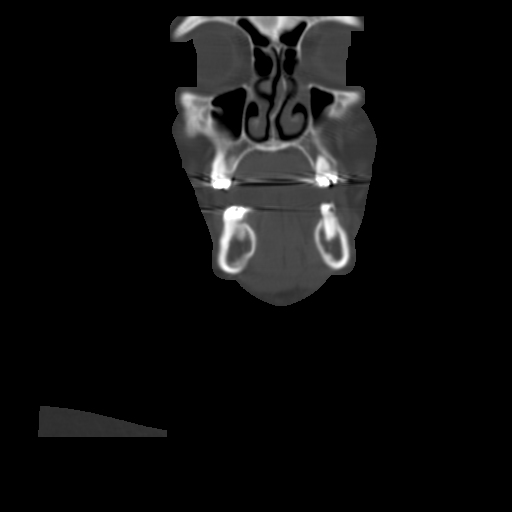
[im 26/64  bone]
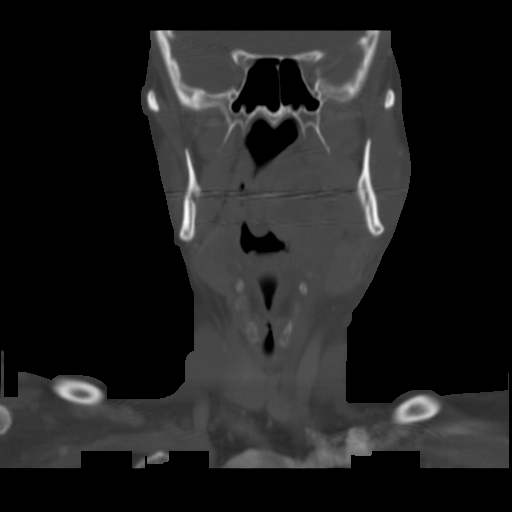
[im 38/64  bone]
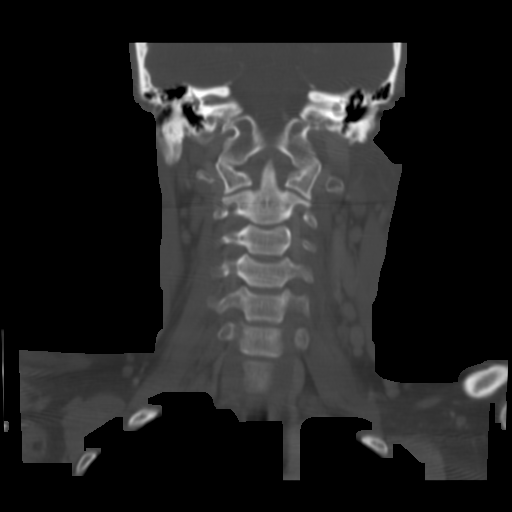

[Series 301: reformatted · sagittal · 0.47mm/px · 5 of 54 slices shown, 6 images (2 of 2)]
[im 18/54  bone]
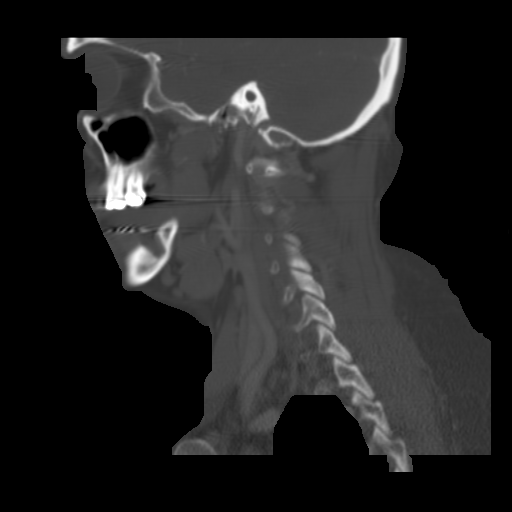
[im 23/54  bone]
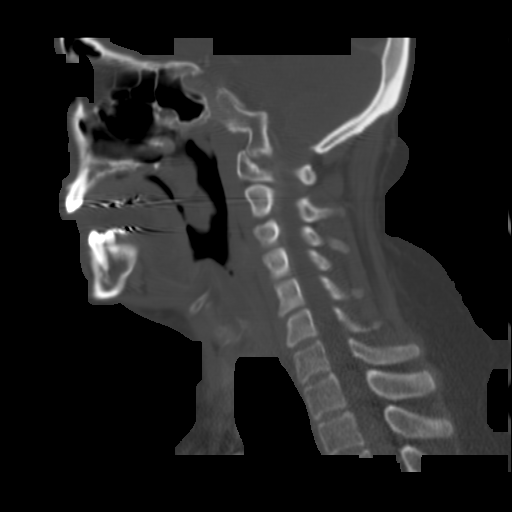
[im 27/54  soft-tissue]
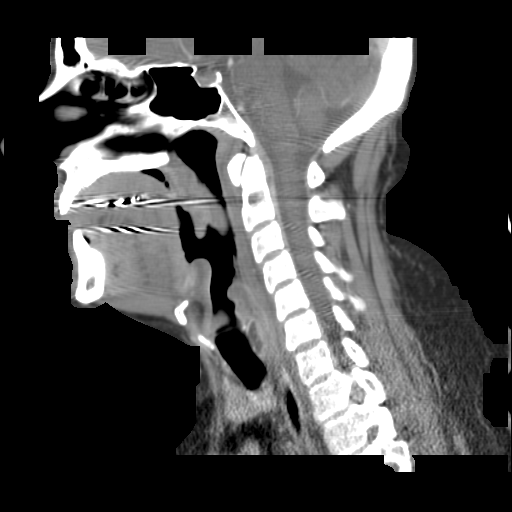
[im 27/54  bone]
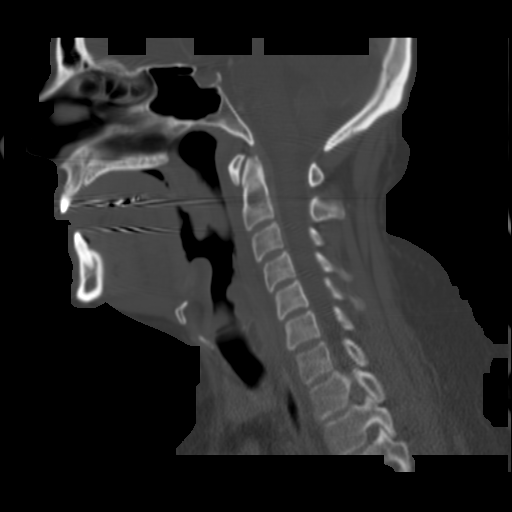
[im 31/54  bone]
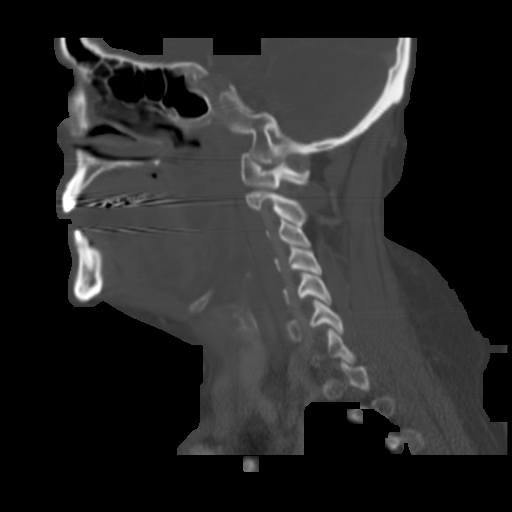
[im 36/54  bone]
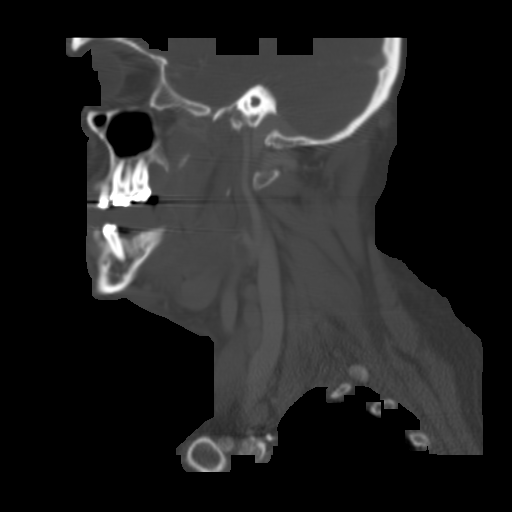

[16 of 33 positions shown; findings below may reference images not displayed]

FINDINGS: Lucencies compatible with recent tooth extraction of
bilateral mandibular molars and left maxillary molar noted.

There is considerable abnormal inflammatory phlegmon tracking along
the left pterygoid musculature and obliterating the left
parapharyngeal space.  Low density portions with enhancing margins
suggest incipient abscess formation.

There is deviation of the pharyngeal structures to the right side
due to mass effect from the left-sided phlegmon/early abscess.
This is shown on consecutive images 19 through 26 of series 2 and
also on image 30 of series 300.

As expected, there are some reactive lymph nodes in the vicinity,
including a 1.4 x 0.9 cm station II lymph node on image 27 of
series 2 and a 1.2 x 0.8 cm station III lymph node on image 43 of
series 2.

The phlegmonous process effaces the left vallecula and piriform
sinus, but does not overtly threaten the airway at this time.  The
palatine tonsils appear enlarged.  The epiglottis also appears
somewhat thickened.  The glottic structures appear symmetric.
IMPRESSION: 1.  Phlegmonous process likely with incipient abscess formation
tracking along the left pterygoid muscles and in the left
parapharyngeal supraglottic region.  An obvious drainable abscess
is not well seen.  This process displaces the pharyngeal structures
to the right side.
2.  Recently extracted molars noted.
3.  There is reactive adenopathy in the neck particularly on the
left side.

## 2008-07-30 ENCOUNTER — Ambulatory Visit: Payer: Self-pay | Admitting: Nurse Practitioner

## 2008-07-30 DIAGNOSIS — L03039 Cellulitis of unspecified toe: Secondary | ICD-10-CM

## 2008-07-30 HISTORY — DX: Cellulitis of unspecified toe: L03.039

## 2008-07-30 LAB — CONVERTED CEMR LAB
AST: 12 units/L (ref 0–37)
Alkaline Phosphatase: 69 units/L (ref 39–117)
BUN: 10 mg/dL (ref 6–23)
Basophils Relative: 1 % (ref 0–1)
Calcium: 9.5 mg/dL (ref 8.4–10.5)
Chloride: 103 meq/L (ref 96–112)
Creatinine, Ser: 0.61 mg/dL (ref 0.40–1.20)
Eosinophils Absolute: 0.2 10*3/uL (ref 0.0–0.7)
Glucose, Bld: 161 mg/dL — ABNORMAL HIGH (ref 70–99)
Hemoglobin: 13.4 g/dL (ref 12.0–15.0)
MCHC: 33.8 g/dL (ref 30.0–36.0)
MCV: 92.3 fL (ref 78.0–100.0)
Monocytes Absolute: 0.6 10*3/uL (ref 0.1–1.0)
Monocytes Relative: 9 % (ref 3–12)
RBC: 4.29 M/uL (ref 3.87–5.11)

## 2008-08-02 ENCOUNTER — Encounter (INDEPENDENT_AMBULATORY_CARE_PROVIDER_SITE_OTHER): Payer: Self-pay | Admitting: Nurse Practitioner

## 2008-09-17 ENCOUNTER — Ambulatory Visit: Payer: Self-pay | Admitting: Nurse Practitioner

## 2008-09-17 LAB — CONVERTED CEMR LAB
Blood Glucose, Fingerstick: 224
Cholesterol: 180 mg/dL (ref 0–200)
Hgb A1c MFr Bld: 9.9 %
Total CHOL/HDL Ratio: 4

## 2008-09-22 ENCOUNTER — Telehealth (INDEPENDENT_AMBULATORY_CARE_PROVIDER_SITE_OTHER): Payer: Self-pay | Admitting: Nurse Practitioner

## 2008-11-19 ENCOUNTER — Ambulatory Visit: Payer: Self-pay | Admitting: Nurse Practitioner

## 2008-11-19 DIAGNOSIS — E7800 Pure hypercholesterolemia, unspecified: Secondary | ICD-10-CM | POA: Insufficient documentation

## 2008-11-19 LAB — CONVERTED CEMR LAB
Bilirubin Urine: NEGATIVE
Blood Glucose, Fingerstick: 152
Cholesterol, target level: 200 mg/dL
Glucose, Urine, Semiquant: >=1000
LDL Goal: 100 mg/dL
Specific Gravity, Urine: 1.01
Urobilinogen, UA: 0.2
pH: 5.5

## 2008-11-22 ENCOUNTER — Telehealth (INDEPENDENT_AMBULATORY_CARE_PROVIDER_SITE_OTHER): Payer: Self-pay | Admitting: *Deleted

## 2008-11-22 ENCOUNTER — Encounter (INDEPENDENT_AMBULATORY_CARE_PROVIDER_SITE_OTHER): Payer: Self-pay | Admitting: Nurse Practitioner

## 2008-11-22 LAB — CONVERTED CEMR LAB
Alkaline Phosphatase: 81 units/L (ref 39–117)
Bilirubin, Direct: 0.2 mg/dL (ref 0.0–0.3)
Indirect Bilirubin: 0.7 mg/dL (ref 0.0–0.9)
LDL Cholesterol: 51 mg/dL (ref 0–99)
Total Bilirubin: 0.9 mg/dL (ref 0.3–1.2)
Triglycerides: 184 mg/dL — ABNORMAL HIGH (ref <150)
VLDL: 37 mg/dL (ref 0–40)

## 2008-12-21 ENCOUNTER — Telehealth (INDEPENDENT_AMBULATORY_CARE_PROVIDER_SITE_OTHER): Payer: Self-pay | Admitting: Nurse Practitioner

## 2009-02-03 ENCOUNTER — Telehealth (INDEPENDENT_AMBULATORY_CARE_PROVIDER_SITE_OTHER): Payer: Self-pay | Admitting: Nurse Practitioner

## 2009-02-15 ENCOUNTER — Telehealth (INDEPENDENT_AMBULATORY_CARE_PROVIDER_SITE_OTHER): Payer: Self-pay | Admitting: *Deleted

## 2009-02-15 ENCOUNTER — Ambulatory Visit: Payer: Self-pay | Admitting: Nurse Practitioner

## 2009-02-15 LAB — CONVERTED CEMR LAB
Bilirubin Urine: NEGATIVE
Blood Glucose, Fingerstick: 216
Blood in Urine, dipstick: NEGATIVE
Glucose, Urine, Semiquant: >=1000
Hgb A1c MFr Bld: 9.4 %
Protein, U semiquant: NEGATIVE
WBC Urine, dipstick: NEGATIVE
pH: 5

## 2009-02-22 ENCOUNTER — Telehealth (INDEPENDENT_AMBULATORY_CARE_PROVIDER_SITE_OTHER): Payer: Self-pay | Admitting: Nurse Practitioner

## 2009-05-05 ENCOUNTER — Ambulatory Visit: Payer: Self-pay | Admitting: Nurse Practitioner

## 2009-05-05 LAB — CONVERTED CEMR LAB: Blood Glucose, Fingerstick: 73

## 2009-09-14 ENCOUNTER — Ambulatory Visit: Payer: Self-pay | Admitting: Nurse Practitioner

## 2009-09-14 DIAGNOSIS — N926 Irregular menstruation, unspecified: Secondary | ICD-10-CM | POA: Insufficient documentation

## 2009-09-14 DIAGNOSIS — T2210XA Burn of first degree of shoulder and upper limb, except wrist and hand, unspecified site, initial encounter: Secondary | ICD-10-CM | POA: Insufficient documentation

## 2009-09-14 HISTORY — DX: Irregular menstruation, unspecified: N92.6

## 2009-09-14 LAB — CONVERTED CEMR LAB
Beta hcg, urine, semiquantitative: NEGATIVE
Bilirubin Urine: NEGATIVE
Glucose, Urine, Semiquant: NEGATIVE
Hgb A1c MFr Bld: 12 %
Protein, U semiquant: NEGATIVE
Specific Gravity, Urine: <1.005
Urobilinogen, UA: 0.2
WBC Urine, dipstick: NEGATIVE

## 2010-02-25 ENCOUNTER — Emergency Department (HOSPITAL_COMMUNITY): Admission: EM | Admit: 2010-02-25 | Discharge: 2010-02-25 | Payer: Self-pay | Admitting: Emergency Medicine

## 2010-02-25 IMAGING — CR DG KNEE COMPLETE 4+V*R*
4 series · 4 of 4 positions shown · non-contrast
Comparison: [DATE]

CLINICAL DATA: Fall.  Knee injury and pain.

RIGHT KNEE - COMPLETE 4+ VIEW

[t knee ap right]
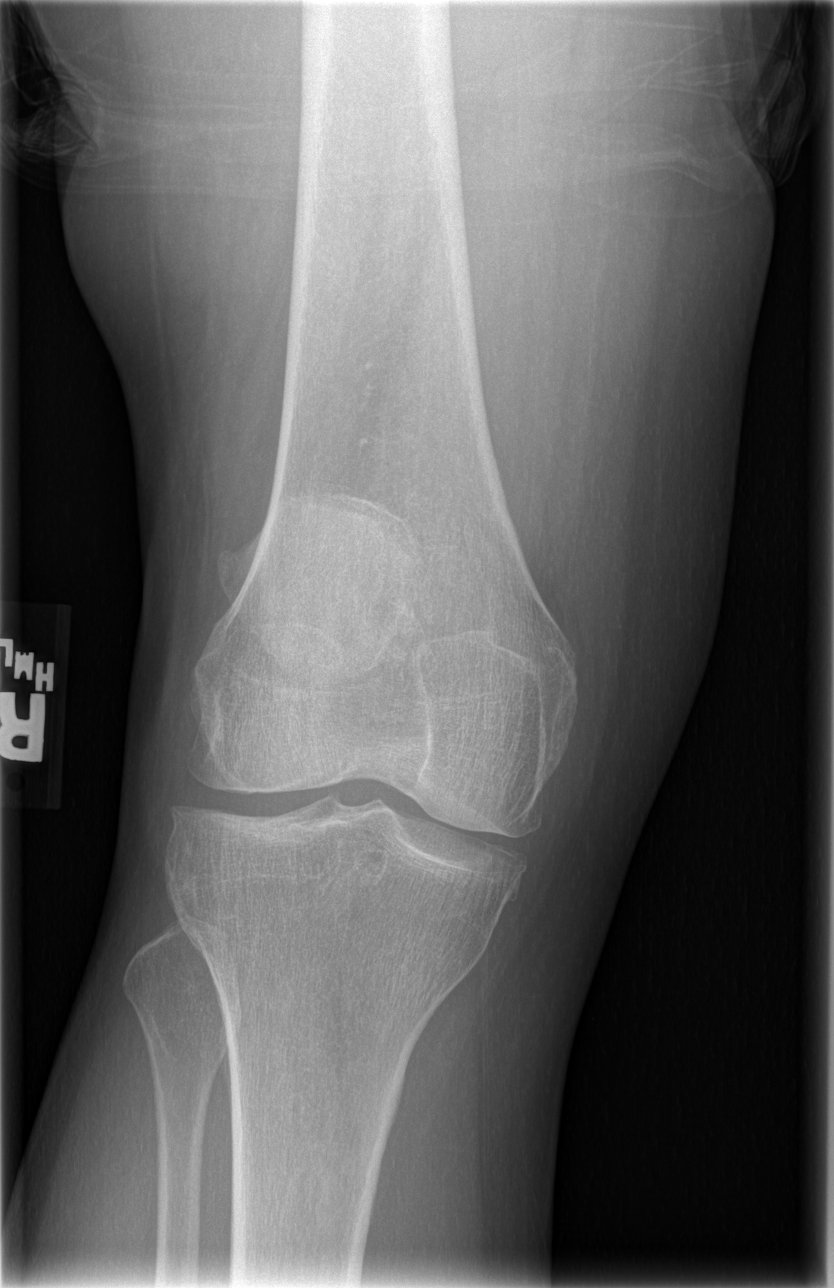

[t knee oblique right (1 of 2)]
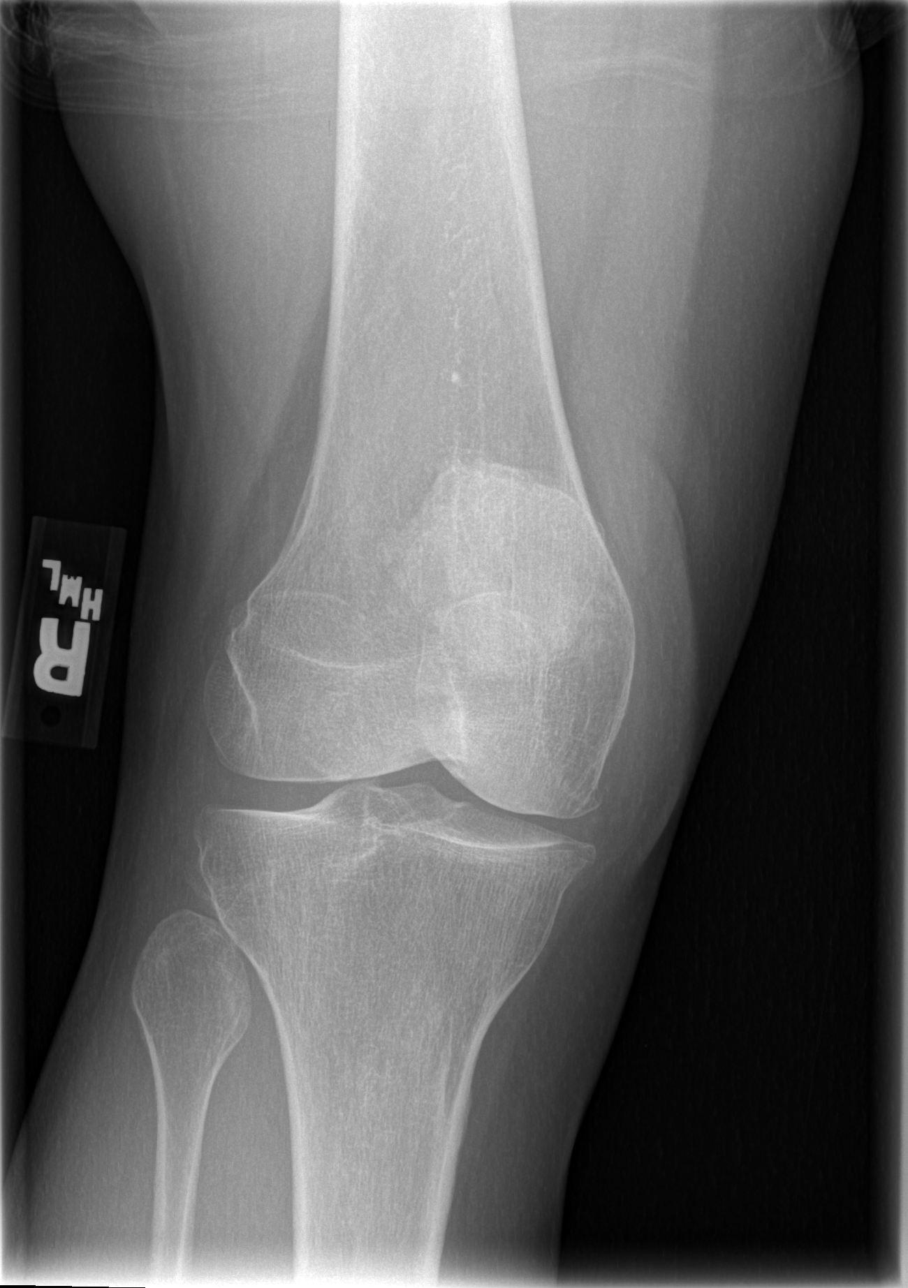

[t knee oblique right (2 of 2)]
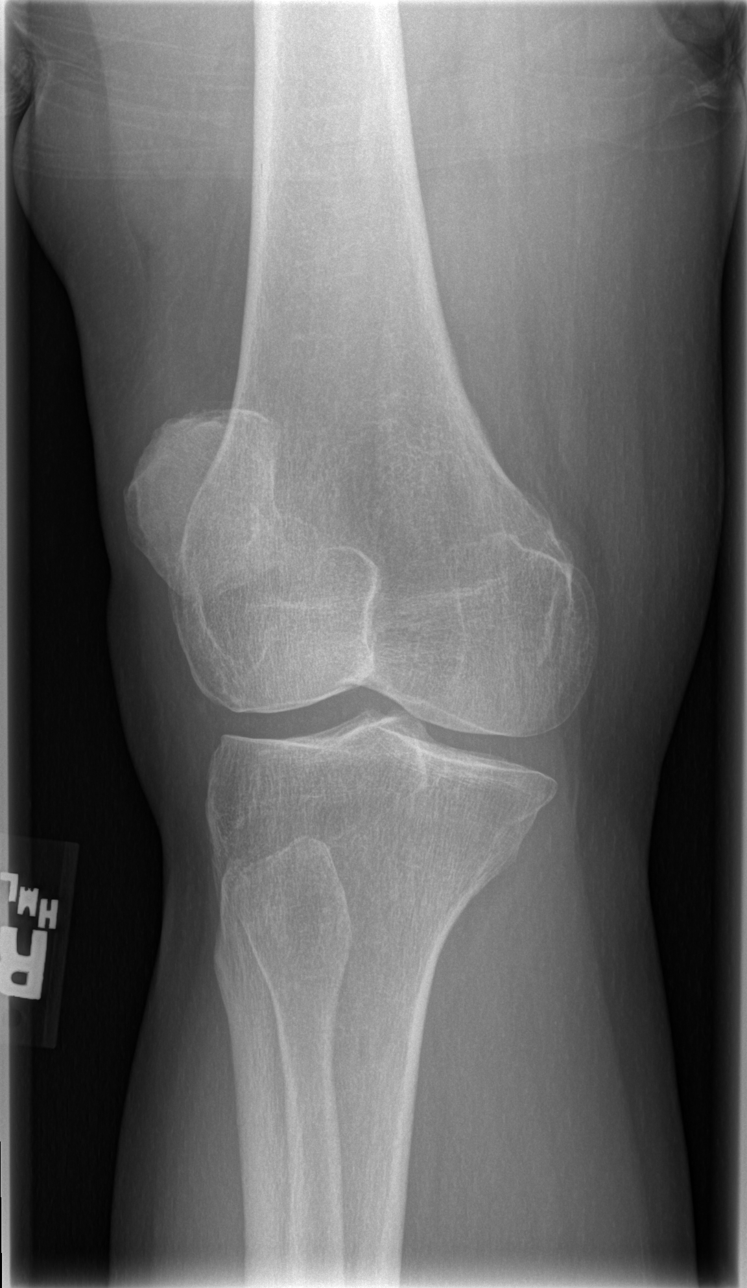

[t knee lat right]
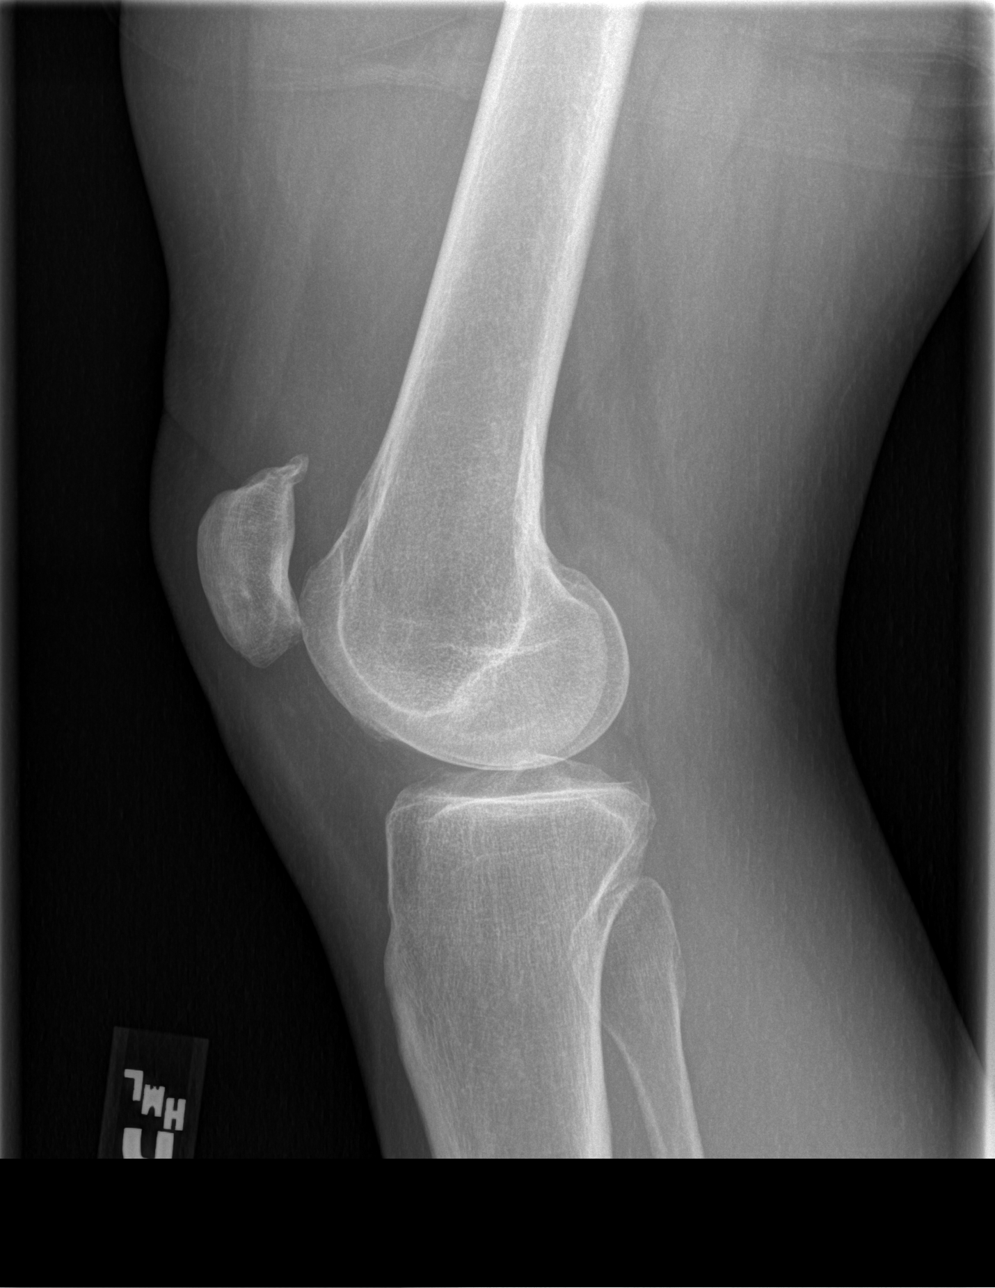

[4 of 4 positions shown; findings below may reference images not displayed]

FINDINGS: No evidence of fracture or dislocation.  No evidence of
knee joint effusion.  Mild medial compartment and the patellar
degenerative spurring seen without significant joint space
narrowing.  No significant change compared to prior study.
IMPRESSION: 1.  No acute findings.
2.  Mild medial and patellar compartment degenerative spurring.

## 2010-02-25 IMAGING — CR DG PELVIS 1-2V
1 series · 1 of 1 positions shown · non-contrast
Comparison: None available.

CLINICAL DATA: Fall.  Pelvic pain.

PELVIS - 1-2 VIEW

[t pelvis a.p.]
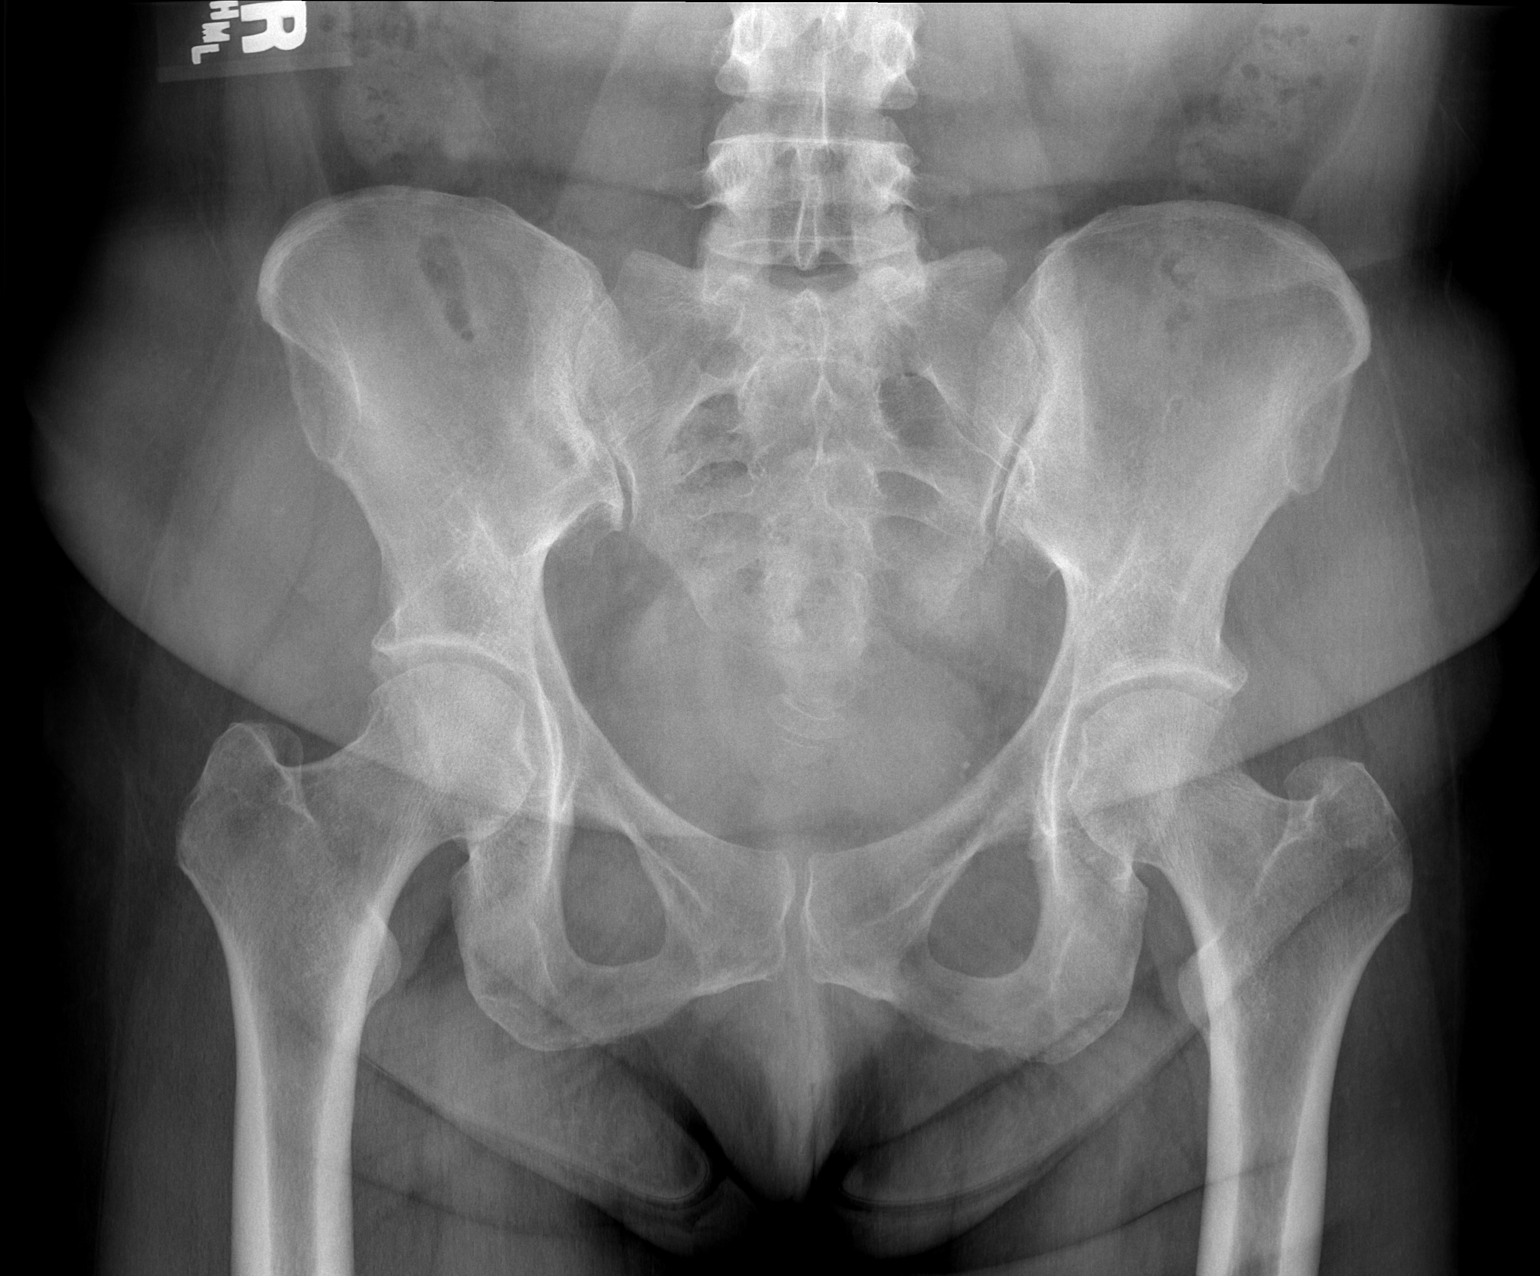

[1 of 1 positions shown; findings below may reference images not displayed]

FINDINGS: There is no evidence of pelvic fracture or diastasis.
No other pelvic bone lesions are seen.
IMPRESSION: Negative.

## 2010-02-28 ENCOUNTER — Emergency Department (HOSPITAL_COMMUNITY): Admission: EM | Admit: 2010-02-28 | Discharge: 2010-03-01 | Payer: Self-pay | Admitting: Emergency Medicine

## 2010-05-31 ENCOUNTER — Encounter (INDEPENDENT_AMBULATORY_CARE_PROVIDER_SITE_OTHER): Payer: Self-pay | Admitting: Nurse Practitioner
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
## 2010-08-10 NOTE — Miscellaneous (Signed)
Summary: [facility]DRESS OF [facility]  ADDRESS OF [facility]   Imported By: Roland Earl 06/26/2010 12:28:25  _____________________________________________________________________  External Attachment:    Type:   Image     Comment:   External Document

## 2010-08-10 NOTE — Miscellaneous (Signed)
Summary: CONSENT TO RELEASE INFO  TO AGENCY  CONSENT TO RELEASE INFO  TO AGENCY   Imported By: Roland Earl 06/26/2010 12:21:56  _____________________________________________________________________  External Attachment:    Type:   Image     Comment:   External Document

## 2010-11-21 NOTE — Consult Note (Signed)
NAMESAHNNON, DEMSKE                 ACCOUNT NO.:  [id number]   MEDICAL RECORD NO.:  [id number]          PATIENT TYPE:  INP   LOCATION:  3019                         FACILITY:  [facility]   PHYSICIAN:  Silvestre Moment, MD         DATE OF BIRTH:  1970-09-21   DATE OF CONSULTATION:  06/29/2008  DATE OF DISCHARGE:                                 CONSULTATION   REASON FOR CONSULTATION:  Left jaw swelling.   HISTORY:  This patient is a 40 year old female who is in the hospital  now for 9 days for treatment of left mandibular cellulitis following  dental extraction on June 15, 2008.  The patient also reports that  she underwent dental extraction of the left upper maxilla and mandibular  region in early October.  After the patient was admitted to the  hospital, she was treated with Unasyn IV until it was changed to Zosyn  from broader coverage on June 27, 2008.  At that time, she was also  further covered with vancomycin due to this development of increasing  swelling, tenderness, and trismus over the left mandible.  The concern  was the resistant organisms at that time.  Further, today clindamycin  was started.  The patient is having difficulty taking in any oral intake  opening her mouth more than about 1 cm.   PAST MEDICAL HISTORY:  Diabetes mellitus type 2 diagnosed in 2006,  severe and uncontrolled prior to this admission with blood sugars high  as 600.   PAST SURGICAL HISTORY:  Unknown.   ALLERGIES:  No known drug allergies.   MEDICATIONS:  1. Lovenox.  2. Aspirin 81 mg daily.  3. Vicodin.  4. NovoLog.  5. Protonix.  6. Guaifenesin.  7. Ibuprofen.  8. Carafate.  9. Amaryl.  10.Vasotec.  11.Dilaudid.  12.Vancomycin started June 27, 2008.  13.Lantus insulin.  14.Zosyn started June 27, 2008.  15.Zofran.  16.Roxicodone plus other p.r.n.   SOCIAL HISTORY:  Has 2 children.   FAMILY HISTORY:  Not known to me.   PHYSICAL EXAMINATION:  VITAL SIGNS:  Temperature 98,  pulse 84-97,  respirations 18, blood pressure 132/87 with oxygen saturation of 99% on  room air.  HEENT:  There is mild swelling over the left parotid, but increased  tenderness to palpation over the pterygoid region, which is palpated  intraorally.  NECK:  Without any definite adenopathy.   CT scan was performed on June 27, 2008, at 5:20 a.m.  This revealed  a very large forming abscess in the left medial pterygoid region.  Although, there is some phlegmon, there is definite evidence of circular  areas of organization of pus.  Reactive lymph nodes were also noted.   ASSESSMENT/PLAN:  This is a 40 year old lady now with severe odontogenic  left-sided mandibular abscess secondary to dental extraction.  Further,  this lady's course has been complicated by diabetic ketoacidosis with  glucose levels in the 400-600s range.  She is now fortunately much  improved.  White blood cell count definitely trending downward from an  amid level of 28,000, to her  last current check on June 24, 2008, of  10,100.  Vital signs are now stable, and the patient is afebrile at 98.  Blood glucose is under much better control on an insulin drip.  Despite  all of the numbers on the chart that indicate that the patient is  getting better, she is still growing a very serious potentially airway  comprising mandibular related abscess in the pterygoid space on the left  side that needs extensive drainage.  She would be at risk for a  necrotizing fasciitis, should this not be drained.  Given her medical  history, she is also at risk for osteonecrosis of the mandible following  these extractions.  For this reason, she should be attended to very  quickly to get this infection treated.  I will empirically go ahead and  make the patient NPO and hold her Lovenox.  I will try to post her for  her incision and drainage in the early afternoon after getting approval  from the administration.      Silvestre Moment, MD   Electronically Signed     SJ/MEDQ  D:  06/29/2008  T:  06/29/2008  Job:  [id number]   cc:   [facility]  Dr. Sunnie Nielsen

## 2010-11-21 NOTE — Discharge Summary (Signed)
Melanie Hall, Melanie Hall                 ACCOUNT NO.:  [id number]   MEDICAL RECORD NO.:  [id number]          PATIENT TYPE:  INP   LOCATION:  3019                         FACILITY:  [facility]   PHYSICIAN:  Domingo Mend, M.D. DATE OF BIRTH:  February 23, 1971   DATE OF ADMISSION:  06/20/2008  DATE OF DISCHARGE:  07/02/2008                               DISCHARGE SUMMARY   ADDENDUM   For full details of hospital course prior to June 30, 2008, please  refer to previously dictated discharge summary by Dr. Valeda Malm June 28, 2008.  Since then, the following events have transpired.   DISCHARGE DIAGNOSES:  1. Left pterygoid abscess.  2. Hypomagnesemia.  Rest of discharge diagnoses remained the same as per prior discharge  summary.   Her discharge medications include:  1. Clindamycin 300 mg p.o. 3 times a day.  2. Lantus 14 units injected subcutaneously at that time.  3. Magnesium oxide 400 mg 3 times a day x1 week.  4. KCl 40 mEq daily x3 days.  5. Vicodin 5/500 mg one tablet every 4 hours as needed for pain.  6. She has also been given a prescription for a Glucometer, test      strips, lancets, and syringes.  7. She will also be discharged on an NovoLog sliding scale insulin to      do 3 times a day before meals following the following scale:  If      her CBG is 101-150 to inject 3 units, between 151 and 200 to inject      4 units, between 201 and 250 to inject 7 units, between 251 and 300      to inject 9 units, between 301 and 352 to inject 12 units, between      351 to 400 to inject 15 units.   HOSPITAL COURSE:  Since June 30, 2008, have included a CT scan of  her neck done on July 05, 2008, that showed a phlegmonous process  likely with incipient abscess formation tracking along the left  pterygoid muscles and in the left parapharyngeal supraglottic region  with an obvious drainable abscess is not well seen.  This process  displaces the pharyngeal structures to the right  side.  A recently  extracted molar is noted.  Reactive adenopathy in the neck particularly  on the left side.  With the results of this x-ray, ENT, Dr. Charna Busman,  and Dr. Redmond Baseman have decided that her airway is most certainly  potentially compromised, so they have decided to go ahead and they did  an I and D of that abscess on June 30, 2008.  She was kept on IV  clindamycin and Zosyn.  We are still waiting for final ENT evaluation as  per their note on July 01, 2008, which would be the day prior to  discharge.  They said that she could potentially be discharged today on  clindamycin and they will probably remove the drain today on July 02, 2008.  If that is the case, we will discharge her home today.  She  has been  instructed to follow up with [facility] and call on Monday for  an appointment.  Of note, she is a newly insulin-dependent diabetic.  We  have started her on Lantus and a sliding scale insulin.  We have  provided her with diabetic instruction while in the hospital, and she  will need close followup as an outpatient for this.  Of note, her blood  pressure has been low normal throughout her entire hospitalization, so I  have decided to discontinue her enalapril at this time.  She also has  hypokalemia and hypomagnesemia.  For this reason, we will discharge her  on magnesium oxide and potassium chloride that she will continue for 1  more week.  I believe the potassium and magnesium level should also be  checked when she visits [facility].   VITAL SIGNS:  On the day of discharge; blood pressure 89/65 rising to  101/74, heart rate 59, respirations 16, O2 sats 99% on room air with a  temperature of 97.9.   LABORATORY DATA:  On the day of discharge; sodium 140, potassium 3.2,  chloride 105, bicarb 28, BUN 4, creatinine 0.74 with a glucose of 77.  WBCs of 10.1, hemoglobin 10.1, platelets of 260, and a magnesium level  of 1.6.      Domingo Mend, M.D.   Electronically Signed     EH/MEDQ  D:  07/02/2008  T:  07/02/2008  Job:  [id number]

## 2010-11-21 NOTE — Discharge Summary (Signed)
Melanie Hall, Melanie Hall                 ACCOUNT NO.:  [id number]   MEDICAL RECORD NO.:  [id number]          PATIENT TYPE:  INP   LOCATION:  3019                         FACILITY:  [facility]   PHYSICIAN:  Durwin Nora, MDDATE OF BIRTH:  08-16-1970   DATE OF ADMISSION:  06/20/2008  DATE OF DISCHARGE:                               DISCHARGE SUMMARY   DISCHARGE DIAGNOSES:  1. Left jaw phlegmon with incipient abscess.  2. Dental care with status post molar extraction for medical      compliance.  3. Hypertension, stable.  4. Diabetes, stable.  5. Hypokalemia, repleted.  6. Mild dyslipidemia.  7. Hepatic steatosis.  8. Hypoalbuminemia.  9. Malnutrition.  10.History of esophagitis.  11.Anemia of chronic disease.  12.Diabetic ketoacidosis, resolved.   CONSULTATION:  ENT.   RADIOLOGY:  The CT neck of June 27, 2008, shows a phlegmonous  process likely with incipient abscess tracked along left pterygoid  muscles and left parapharyngeal supraglottic area.  No obvious drainable  abscess, clearly thin recent extracted molars, reactive adenopathy in  the neck particularly on the left side.  Renal ultrasound of June 26, 2008, shows a focal area of heterogeneous increased echotexture in  the mid and lower poles of the left kidney, nonspecific finding but may  be seen with renal infract or pyonephritis.  No evidence of renal mass  or hydronephrosis.  The oto Pantogram of June 21, 2008, shows no  significant abnormalities, status post removal of molars from the  mandible.  A CT angiogram of June 20, 2008, shows no evidence of  acute PE.  There is question of esophagitis.  There is hepatic steatosis  and old rib fractures on the right.  Chest x-rays on admission shows no  active cardiopulmonary process.   HOSPITAL COURSE:  This is a 40 year old female who presented to the  emergency room on June 20, 2008, complaining of chest discomfort,  inability to eat, left jaw pain  and swelling, also upper abdominal pain.  She was noticed to be having diabetic ketoacidosis, leucocytosis with  hyponatremia.  A blood glucose was noticed to be elevated at 763.  Her  leucocyte was 28,000.  The patient was started on the IV insulin  infusion and IV normal saline for hydration.  Recurrent hypokalemia was  repleted IV  and p.o.  She was started on proton pump inhibitor for the  esophagitis.  Initially started an IV Unasyn; however, by June 26, 2008, though the left jaw swelling and pain was a little bit improved,  the patient was still having very poor oral intake.  A CT of the neck  shows what looked like phlegmonous reaction going on of the left jaw  with incipient abscess.  The patient's antibiotic was changed to IV  vancomycin and Zosyn.  Attempts were being made to contact ENT, still  awaiting the response for evaluation.  The patient did develop a reddish  rash in the posterior trunk by June 28, 2008, vancomycin is being  held for possible adverse drug reaction and this has been switched to IV  clindamycin.  She is to be on Benadryl 25 q.8 h. p.r.n. for itching and  possibly on IV steroids for the worsening of the acute adverse drug  reaction.  A urinalysis on June 28, 2008, showed many yeast.  She  will be started on p.o. Diflucan and Calamine lotion b.i.d. topically to  soothe the skin.   PLAN:  Follow ENT recommendations, clinical social worker input for  possible RN follow-up.  She may need IV antibiotics at home.  Further  clinical course to be dictated as the hospital stay progresses.      Durwin Nora, MD  Electronically Signed     MIO/MEDQ  D:  06/28/2008  T:  06/29/2008  Job:  [id number]

## 2010-11-21 NOTE — H&P (Signed)
[name], [name]                 ACCOUNT NO.:  [id number]   MEDICAL RECORD NO.:  [id number]          PATIENT TYPE:  EMS   LOCATION:  MAJO                         FACILITY:  [facility]   PHYSICIAN:  Cherene Altes, M.D.DATE OF BIRTH:  02-10-1971   DATE OF ADMISSION:  06/20/2008  DATE OF DISCHARGE:                              HISTORY & PHYSICAL   PRIMARY CARE PHYSICIAN:  Unassigned.   CHIEF COMPLAINT:  Chest/epigastric pain which is sharp and stabbing.   HISTORY OF PRESENT ILLNESS:  Melanie Hall is a 40 year old female who  lives in the [place] area.  She has no regular physician.  Approximately 4 days ago she underwent extraction of 3 carious teeth of  the left mandible.  This left her with a severely sore and swollen  mouth.  She states that she, therefore, has not been able to eat solid  food.  As a result, she has been subsisting primarily on sweet tea and  other regular sodas, this despite her diabetes.  She has not been able  to take her Glucophage because of the size of the pill and difficulty  swallowing it.  Today she began to experience severe, sharp, stabbing  epigastric and sometimes substernal chest pain.  This is described as  constant and unrelenting.  It is definitely worse with a deep breath or  movement per her report.  She has had no nausea or vomiting.  There has  been no abdominal pain or cramps.  There has been no diarrhea.  There  has been no shortness of breath, fevers or chills.  The patient  presented to the emergency room for evaluation of her chest pain.  At  that time she was found to have a bicarb of 8 and a serum glucose of  673.   REVIEW OF SYSTEMS:  As noted above, the patient underwent extraction of  multiple carious teeth approximately 4 days ago by a Dr. 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  She states that she has undergone  previous dental extractions and suffered with severe pain and edema for  weeks after her previous  extractions.  She states that her current  extraction postop course is as per her usual.  She has no other  complaints whatsoever such that a comprehensive review of systems is  unremarkable with the exception of the noted positive elements above.   PAST MEDICAL HISTORY:  1. Type 2 diabetes diagnosed in 2006.  2. Recent dental extraction as noted above.   OUTPATIENT MEDICATIONS:  Glucophage 1000 mg b.i.d.   ALLERGIES:  No known drug allergies.   FAMILY HISTORY:  The patient's father died at age 18 secondary to  suicide.  The remainder of the patient's family history is  noncontributory this admission.   SOCIAL HISTORY:  The patient lives in [place].  She has 4 total  children but only 2 who live with her.  She denies smoking or alcohol  abuse.   DATA REVIEW:  D-dimer is elevated at 0.49. A CT angio of the chest  revealed no evidence of pulmonary  embolism but does suggest hepatic  steatosis and raises the question of possible esophagitis.  BMET  revealed sodium 128, potassium normal at 4.6, chloride normal, bicarb 8,  creatinine and BUN normal. Serum glucose was 763. Her pH was 7.17, PCO2  of 21, PO2 of 101.  White count was elevated at 28,000, hemoglobin 17.1,  with platelet count of 355 and a normal MCV.  Urine pregnancy test is  negative.  Urinalysis is not consistent with a urinary tract infection.  A CT angio of the chest is as reported above.   PHYSICAL EXAMINATION:  VITAL SIGNS:  Temperature 97.1, blood pressure  141/93, heart rate 123, respiratory rate 18, O2 sat 98% on room air.  GENERAL: Obese female in no acute respiratory distress.  HEENT:  Normocephalic and atraumatic.  Pupils are equal, round, and  reactive to light and accommodation. Extraocular muscles are intact  bilaterally.  OC/OP clear.  NECK: No JVD. No lymphadenopathy. No thyromegaly.  LUNGS:  Clear to auscultation bilaterally without wheezes or rhonchi.  CARDIOVASCULAR:  Tachycardiac but regular without  gallop or rub with  normal S1 and S2 and no murmur.  ABDOMEN: Obese, soft.  Bowel sounds are present.  No organomegaly. No  rebound. No ascites.  EXTREMITIES: No significant clubbing, cyanosis, or edema of bilateral  lower extremities.  NEUROLOGICAL:  Cranial nerves II through XII are intact bilaterally.  With 5/5 in the upper and lower extremities.  Intact sensation to touch  throughout.   IMPRESSION AND PLAN:  1. Diabetic ketoacidosis.  The patient is suffering with a clear      diabetic ketoacidosis.  I have advised the patient of the very      serious nature of this condition and the fact that it can in fact      be life-threatening.  I have advised her of the inadvisability of      high dose of nondiabetic liquids as she has been consuming of late.      The patient will be admitted to the step-down bed.  Insulin drip      will be initiated and manipulated via nurses per the glucose      stabilizer protocol.  The patient will be hydrated aggressively.      We will follow frequent BMETs to reassess the patient's bicarb as      well as her potassium.  We will empirically give her 2 runs of      potassium now, given her potassium level of 4.6, realizing that it      will definitely drop and quite precipitously as we correct her      severe hyperglycemia.  We will continue to educate the patient on      proper care of diabetes while she is here and resume sliding scale      insulin once the patient's gap has closed and her diabetic      ketoacidosis has resolved.  2. Multiple recent dental extractions.  The patient does in fact have      some edema of the left lower jaw.  She refuses to open her mouth      wide enough for me to properly evaluate the tooth      sockets/extraction sites.  For now we will simply monitor the      patient.  In the morning if her DKA has resolved, it will be      appropriate to obtain an orthopantogram to assure that there is no  evidence of an abscess  in this region.  3. Leukocytosis. The patient has a marked leukocytosis, but this is      commonly seen with diabetic ketoacidosis alone.  We will treat the      patient's DKA and hydrate her aggressively.  We will follow up her      white blood cell count in the morning.  If it has not significantly      decreased, this could be interpreted as a possible sign of a      mandibular abscess, and as described above an orthopantogram would      be indicated.  4. Hyponatremia.  This is clearly a pseudohyponatremia due to the      patient's severe hyperglycemia. We will correct her hyperglycemia      and DKA as noted above, and a sodium level will be repeated in the      morning.      Cherene Altes, M.D.  Electronically Signed     JTM/MEDQ  D:  06/20/2008  T:  06/20/2008  Job:  [id number]

## 2010-11-21 NOTE — Op Note (Signed)
Melanie Hall, Melanie Hall                 ACCOUNT NO.:  [id number]   MEDICAL RECORD NO.:  [id number]          PATIENT TYPE:  INP   LOCATION:  3019                         FACILITY:  [facility]   PHYSICIAN:  Onnie Graham, MD     DATE OF BIRTH:  28-Nov-1970   DATE OF PROCEDURE:  06/30/2008  DATE OF DISCHARGE:                               OPERATIVE REPORT   PREOPERATIVE DIAGNOSIS:  Left odontogenic parapharyngeal abscess.   POSTOPERATIVE DIAGNOSIS:  Left odontogenic parapharyngeal abscess.   PROCEDURE:  Intraoral incision and drainage of left parapharyngeal  abscess.   SURGEON:  Onnie Graham, MD   ANESTHESIA:  General endotracheal anesthesia.   COMPLICATIONS:  None.   INDICATIONS:  The patient is a 40 year old white female who developed a  severe left odontogenic cellulitis/abscess related to dental extraction.  She is diabetic and her course has been complicated by DKA.  She has  been treated with intravenous antibiotics with some improvement in white  blood count as well as being afebrile, but a CT scan demonstrates a  large fluid collection in the left parapharyngeal space and pterygoid  region.  She presents to the operating room for surgical management.   FINDINGS:  Upon placement of the retractor, there was yellow pus seen  draining spontaneously posterior to the left tonsil.  Cultures were  taken from this pus.  The cavity was entered through an incision  anterior to the tonsil and was copiously irrigated.  The cavity was  palpated to run in a lateral direction around the back of the pterygoid  bones.   DESCRIPTION OF PROCEDURE:  The patient was identified in the holding  room and informed consent having been obtained including discussion of  risks, benefits, and alternatives, the patient was brought to the  operative suite and put on the operative table in supine position.  Anesthesia was induced and the patient was intubated by the anesthesia  team with mild difficulty due to  trismus.  The eyes were taped closed  and there was turned 90 degrees from anesthesia.  A head wrap was placed  around the patient's head.  The patient was given intravenous  antibiotics during the case.  The eyes were taped closed prior to  placing the head wrap.  A Crowe-Davis retractor was inserted in the  mouth and opened reveal the oropharynx, which was placed in suspension  on the Mayo stand.  The oropharynx was examined and the tonsil palpated.  Pus was expressed from posterior to the tonsil and culture swabs were  used to culture from this region.  A curvilinear incision was then made  anterior to the tonsil on the left side using Bovie electrocautery and  then deepened with blunt clamp.  Big abscess cavity was entered easily  in this fashion.  The cavity was then copiously irrigated with saline  using a red rubber catheter and some of the yellow tissue was removed  with forceps.  A tonsil clamp was then used to palpate within the cavity  in a lateral direction in order to connect the abscess cavity into  one  space.  After irrigating second time, a 0.25-inch Penrose drain was  placed into the cavity and secured at this mucosal level using 2-0 silk  suture.  The throat was copiously irrigated  with saline and a flexible catheter was passed down the esophagus to  suck out the stomach and esophagus.  After suctioning the esophagus, the  retractor was taken out of suspension and removed through the patient's  mouth.  She was returned to anesthesia for wake up, was extubated, and  moved to the recovery room in stable condition.      Onnie Graham, MD  Electronically Signed     DDB/MEDQ  D:  06/30/2008  T:  06/30/2008  Job:  [id number]

## 2010-11-24 NOTE — H&P (Signed)
NAMEISISS, HORLICK                 ACCOUNT NO.:  [id number]   MEDICAL RECORD NO.:  [id number]          PATIENT TYPE:  OBV   LOCATION:  A222                          FACILITY:  [facility]   PHYSICIAN:  Bonnielee Haff, MD     DATE OF BIRTH:  [birth year]   DATE OF ADMISSION:  03/07/2005  DATE OF DISCHARGE:  08/30/2006LH                                HISTORY & PHYSICAL   ADMISSION DIAGNOSES:  1.  Involuntary commitment for suicidal ideation.  2.  Hyperglycemia secondary to poorly controlled diabetes, type 2.   CHIEF COMPLAINT:  Involuntary commitment.   HISTORY OF PRESENT ILLNESS:  The patient is a 40 year old Caucasian female  with past medical history of diabetes diagnosed about two months ago.  As  per patient, she does not carry any other diagnosis, such as depression in  the past.  The patient was brought into the emergency room after she left  her children home alone yesterday.  As per the commitment papers and as per  ER notes, the patient was suicidal yesterday.  Currently, she is not  mentioning any suicidal ideation.   The patient denies any somatic symptoms at this time.  There is no chest  pain, shortness of breath, abdominal pain, nausea, vomiting, dysuria or any  problems with her moving her bowels.   The patient does give me this history of thinking that she may be pregnant.  She mentions that during her previous pregnancies she has had periods  continuously throughout her pregnancies and even though she has had tubal  ligation five years ago, she wonders if she is pregnant.  She is demanding  to get a blood pregnancy test.  Otherwise, she is denying any abdominal pain  at this time.  According to her, her last menstrual period was 1-1/2 weeks  ago.   MEDICATIONS:  The patient is supposed to be on glyburide 2.5 mg daily.  However, because of financial problems, she has been taking it only once  every other day and sometimes not even that.  She denies taking any other  medications.  No over-the-counter pills.   ALLERGIES:  SHE IS NOT ALLERGIC TO ANY MEDICATIONS.   PAST MEDICAL HISTORY:  Significant for diabetes, type 2, diagnosed a couple  of months ago.  No history of lung disease, heart disease, hypertension in  the past.  She has history of tubal ligation five years ago.   SOCIAL HISTORY:  The patient lives in [place] with her boyfriend, and she  states she has four children.  As per some of the ER notes, it mentions that  she has two children.  She does not work, does not smoke, quit six years  ago.  Denies any illicit drug use or alcohol use.   FAMILY HISTORY:  Father committed suicide at the age of 80.  Mother has  hypertension.  Grandparents have diabetes, hypertension and maybe coronary  artery disease.  One of her sisters has asthma.   REVIEW OF SYSTEMS:  A 10-point review of systems was done which was  unremarkable except as mentioned in the  HPI.   PHYSICAL EXAMINATION:  VITAL SIGNS:  The patient is afebrile, blood pressure  105/57, heart rate 82, respiratory rate 18, pulse oximetry 98% on room air.  GENERAL:  This is an obese white female in no apparent distress.  HEENT:  There is no pallor nor icterus.  Oral mucous membranes are moist.  No oral lesions seen.  NECK:  Soft and supple.  LUNGS:  Clear to auscultation bilaterally.  CARDIOVASCULAR:  S1 and S2 normal, regular, no murmurs appreciated.  ABDOMEN:  Soft, obese, slight tenderness appreciated in the suprapubic  region but no rebound or rigidity.  Bowel sounds are present.  No mass or  organomegaly appreciated.  EXTREMITIES:  Without any edema.  Peripheral pulses are palpable.  No  lesions are seen on the feet bilaterally.   LABORATORY DATA:  Urine pregnancy test was negative.  CBC showed white count  11.4, hemoglobin 15.6, platelet count 315.   Initially, sodium was 130, latest lab value shows sodium 138.  The patient  on presentation had a serum glucose of 296.  Last  measured value was 101.  BUN 4, creatinine 0.6.  LFTs are within normal limits except for a bilirubin of 1.7.  Calcium is  8.7.   Urine tox screen was negative for cocaine, opiates or THC, alcohol level  less than 5.   Initial UA showed specific gravity more than 1.03, negative blood, trace  protein, negative nitrite, negative leukocyte, 15 ketones, 500 uro.  This is  from urine sample at 4:20 a.m.   ABG was done which showed pH 7.46, pCO2 31.  No imaging studies have been  done.   IMPRESSION:  This is a 40 year old Caucasian female with past medical  history of type 2 diabetes who is involuntarily committed because of  suicidal ideation and leaving her children alone at home.  Her blood sugars  were initially uncontrolled in the emergency room for which she has received  IV fluids and insulin.  [facility] physicians have denied admission based  on a UA which continues to show ketones and glucose.  However, the patient  does not have an anion gap.  Her blood sugars are much better controlled  now.  She should be stable to be transferred to [facility].  The patient  gives this elaborate history of her unusual pregnancies and she is asking  for a serum beta hCG.  However, her urine pregnancy test is negative.  Her  last menstrual period was 1.5 weeks ago, and she has had history of tubal  ligation in the past.   PLAN:  Continue glyburide at 5 mg per day.  The patient might benefit from  metformin at a later date considering her overweight/obesity status.   I will have the ACT team come and re-evaluate this patient and evaluate for  transfer to either [facility] or another facility.  The patient is  medically cleared.      Bonnielee Haff, MD  Electronically Signed    GK/MEDQ  D:  03/07/2005  T:  03/07/2005  Job:  [id number]

## 2011-04-13 LAB — BASIC METABOLIC PANEL
BUN: 12 mg/dL (ref 6–23)
BUN: 4 mg/dL — ABNORMAL LOW (ref 6–23)
BUN: 5 mg/dL — ABNORMAL LOW (ref 6–23)
BUN: 6 mg/dL (ref 6–23)
BUN: 6 mg/dL (ref 6–23)
BUN: 7 mg/dL (ref 6–23)
BUN: 7 mg/dL (ref 6–23)
BUN: <1 mg/dL — ABNORMAL LOW (ref 6–23)
BUN: <1 mg/dL — ABNORMAL LOW (ref 6–23)
CO2: 11 mEq/L — ABNORMAL LOW (ref 19–32)
CO2: 15 mEq/L — ABNORMAL LOW (ref 19–32)
CO2: 15 mEq/L — ABNORMAL LOW (ref 19–32)
CO2: 16 mEq/L — ABNORMAL LOW (ref 19–32)
CO2: 16 mEq/L — ABNORMAL LOW (ref 19–32)
CO2: 18 mEq/L — ABNORMAL LOW (ref 19–32)
CO2: 18 mEq/L — ABNORMAL LOW (ref 19–32)
CO2: 18 mEq/L — ABNORMAL LOW (ref 19–32)
CO2: 19 mEq/L (ref 19–32)
CO2: 20 mEq/L (ref 19–32)
CO2: 22 mEq/L (ref 19–32)
CO2: 24 mEq/L (ref 19–32)
CO2: 28 mEq/L (ref 19–32)
Calcium: 7.9 mg/dL — ABNORMAL LOW (ref 8.4–10.5)
Calcium: 7.9 mg/dL — ABNORMAL LOW (ref 8.4–10.5)
Calcium: 8 mg/dL — ABNORMAL LOW (ref 8.4–10.5)
Calcium: 8.2 mg/dL — ABNORMAL LOW (ref 8.4–10.5)
Calcium: 8.4 mg/dL (ref 8.4–10.5)
Calcium: 8.4 mg/dL (ref 8.4–10.5)
Calcium: 8.4 mg/dL (ref 8.4–10.5)
Calcium: 8.4 mg/dL (ref 8.4–10.5)
Calcium: 8.5 mg/dL (ref 8.4–10.5)
Calcium: 8.5 mg/dL (ref 8.4–10.5)
Calcium: 8.6 mg/dL (ref 8.4–10.5)
Calcium: 8.7 mg/dL (ref 8.4–10.5)
Calcium: 8.9 mg/dL (ref 8.4–10.5)
Calcium: 8.9 mg/dL (ref 8.4–10.5)
Calcium: 9.5 mg/dL (ref 8.4–10.5)
Chloride: 102 mEq/L (ref 96–112)
Chloride: 103 mEq/L (ref 96–112)
Chloride: 105 mEq/L (ref 96–112)
Chloride: 107 mEq/L (ref 96–112)
Chloride: 108 mEq/L (ref 96–112)
Chloride: 109 mEq/L (ref 96–112)
Chloride: 109 mEq/L (ref 96–112)
Chloride: 111 mEq/L (ref 96–112)
Chloride: 98 mEq/L (ref 96–112)
Creatinine, Ser: 0.49 mg/dL (ref 0.4–1.2)
Creatinine, Ser: 0.52 mg/dL (ref 0.4–1.2)
Creatinine, Ser: 0.57 mg/dL (ref 0.4–1.2)
Creatinine, Ser: 0.61 mg/dL (ref 0.4–1.2)
Creatinine, Ser: 0.62 mg/dL (ref 0.4–1.2)
Creatinine, Ser: 0.65 mg/dL (ref 0.4–1.2)
Creatinine, Ser: 0.69 mg/dL (ref 0.4–1.2)
Creatinine, Ser: 0.74 mg/dL (ref 0.4–1.2)
Creatinine, Ser: 0.75 mg/dL (ref 0.4–1.2)
Creatinine, Ser: 1.04 mg/dL (ref 0.4–1.2)
Creatinine, Ser: 1.26 mg/dL — ABNORMAL HIGH (ref 0.4–1.2)
GFR calc Af Amer: >60 mL/min (ref >60)
GFR calc Af Amer: >60 mL/min (ref >60)
GFR calc Af Amer: >60 mL/min (ref >60)
GFR calc Af Amer: >60 mL/min (ref >60)
GFR calc Af Amer: >60 mL/min (ref >60)
GFR calc Af Amer: >60 mL/min (ref >60)
GFR calc Af Amer: >60 mL/min (ref >60)
GFR calc Af Amer: >60 mL/min (ref >60)
GFR calc Af Amer: >60 mL/min (ref >60)
GFR calc Af Amer: >60 mL/min (ref >60)
GFR calc Af Amer: >60 mL/min (ref >60)
GFR calc Af Amer: >60 mL/min (ref >60)
GFR calc Af Amer: >60 mL/min (ref >60)
GFR calc Af Amer: >60 mL/min (ref >60)
GFR calc non Af Amer: >60 mL/min (ref >60)
GFR calc non Af Amer: >60 mL/min (ref >60)
GFR calc non Af Amer: >60 mL/min (ref >60)
GFR calc non Af Amer: >60 mL/min (ref >60)
GFR calc non Af Amer: >60 mL/min (ref >60)
GFR calc non Af Amer: >60 mL/min (ref >60)
GFR calc non Af Amer: >60 mL/min (ref >60)
GFR calc non Af Amer: >60 mL/min (ref >60)
GFR calc non Af Amer: >60 mL/min (ref >60)
GFR calc non Af Amer: >60 mL/min (ref >60)
GFR calc non Af Amer: >60 mL/min (ref >60)
GFR calc non Af Amer: >60 mL/min (ref >60)
GFR calc non Af Amer: >60 mL/min (ref >60)
GFR calc non Af Amer: >60 mL/min (ref >60)
GFR calc non Af Amer: >60 mL/min (ref >60)
GFR calc non Af Amer: >60 mL/min (ref >60)
Glucose, Bld: 144 mg/dL — ABNORMAL HIGH (ref 70–99)
Glucose, Bld: 149 mg/dL — ABNORMAL HIGH (ref 70–99)
Glucose, Bld: 154 mg/dL — ABNORMAL HIGH (ref 70–99)
Glucose, Bld: 168 mg/dL — ABNORMAL HIGH (ref 70–99)
Glucose, Bld: 209 mg/dL — ABNORMAL HIGH (ref 70–99)
Glucose, Bld: 211 mg/dL — ABNORMAL HIGH (ref 70–99)
Glucose, Bld: 227 mg/dL — ABNORMAL HIGH (ref 70–99)
Glucose, Bld: 260 mg/dL — ABNORMAL HIGH (ref 70–99)
Glucose, Bld: 269 mg/dL — ABNORMAL HIGH (ref 70–99)
Glucose, Bld: 298 mg/dL — ABNORMAL HIGH (ref 70–99)
Glucose, Bld: 310 mg/dL — ABNORMAL HIGH (ref 70–99)
Glucose, Bld: 67 mg/dL — ABNORMAL LOW (ref 70–99)
Glucose, Bld: 672 mg/dL (ref 70–99)
Glucose, Bld: 77 mg/dL (ref 70–99)
Potassium: 2.9 mEq/L — ABNORMAL LOW (ref 3.5–5.1)
Potassium: 3 mEq/L — ABNORMAL LOW (ref 3.5–5.1)
Potassium: 3.1 mEq/L — ABNORMAL LOW (ref 3.5–5.1)
Potassium: 3.2 mEq/L — ABNORMAL LOW (ref 3.5–5.1)
Potassium: 3.2 mEq/L — ABNORMAL LOW (ref 3.5–5.1)
Potassium: 3.4 mEq/L — ABNORMAL LOW (ref 3.5–5.1)
Potassium: 3.4 mEq/L — ABNORMAL LOW (ref 3.5–5.1)
Potassium: 3.4 mEq/L — ABNORMAL LOW (ref 3.5–5.1)
Potassium: 3.5 mEq/L (ref 3.5–5.1)
Potassium: 3.6 mEq/L (ref 3.5–5.1)
Potassium: 3.7 mEq/L (ref 3.5–5.1)
Potassium: 3.7 mEq/L (ref 3.5–5.1)
Potassium: 3.7 mEq/L (ref 3.5–5.1)
Potassium: 3.7 mEq/L (ref 3.5–5.1)
Potassium: 3.9 mEq/L (ref 3.5–5.1)
Potassium: 4 mEq/L (ref 3.5–5.1)
Sodium: 131 mEq/L — ABNORMAL LOW (ref 135–145)
Sodium: 133 mEq/L — ABNORMAL LOW (ref 135–145)
Sodium: 133 mEq/L — ABNORMAL LOW (ref 135–145)
Sodium: 133 mEq/L — ABNORMAL LOW (ref 135–145)
Sodium: 134 mEq/L — ABNORMAL LOW (ref 135–145)
Sodium: 134 mEq/L — ABNORMAL LOW (ref 135–145)
Sodium: 134 mEq/L — ABNORMAL LOW (ref 135–145)
Sodium: 134 mEq/L — ABNORMAL LOW (ref 135–145)
Sodium: 135 mEq/L (ref 135–145)
Sodium: 135 mEq/L (ref 135–145)
Sodium: 135 mEq/L (ref 135–145)
Sodium: 135 mEq/L (ref 135–145)
Sodium: 135 mEq/L (ref 135–145)
Sodium: 136 mEq/L (ref 135–145)
Sodium: 136 mEq/L (ref 135–145)
Sodium: 136 mEq/L (ref 135–145)
Sodium: 137 mEq/L (ref 135–145)
Sodium: 139 mEq/L (ref 135–145)

## 2011-04-13 LAB — UIFE/LIGHT CHAINS/TP QN, 24-HR UR
Albumin, U: DETECTED
Alpha 1, Urine: DETECTED — AB
Free Kappa Lt Chains,Ur: 30.21 mg/dL — ABNORMAL HIGH (ref 0.04–1.51)
Free Lambda Lt Chains,Ur: 6.21 mg/dL — ABNORMAL HIGH (ref 0.08–1.01)

## 2011-04-13 LAB — POTASSIUM
Potassium: 2.9 mEq/L — ABNORMAL LOW (ref 3.5–5.1)
Potassium: 3.3 mEq/L — ABNORMAL LOW (ref 3.5–5.1)
Potassium: 3.4 mEq/L — ABNORMAL LOW (ref 3.5–5.1)

## 2011-04-13 LAB — POCT I-STAT, CHEM 8
BUN: 12 mg/dL (ref 6–23)
Calcium, Ion: 1.14 mmol/L (ref 1.12–1.32)
Chloride: 104 mEq/L (ref 96–112)
Creatinine, Ser: 0.9 mg/dL (ref 0.4–1.2)
Glucose, Bld: 673 mg/dL (ref 70–99)
HCT: 53 % — ABNORMAL HIGH (ref 36.0–46.0)
Hemoglobin: 18 g/dL — ABNORMAL HIGH (ref 12.0–15.0)
Sodium: 128 mEq/L — ABNORMAL LOW (ref 135–145)
TCO2: 8 mmol/L (ref 0–100)
TCO2: 8 mmol/L (ref 0–100)

## 2011-04-13 LAB — GLUCOSE, CAPILLARY
Glucose-Capillary: 135 mg/dL — ABNORMAL HIGH (ref 70–99)
Glucose-Capillary: 136 mg/dL — ABNORMAL HIGH (ref 70–99)
Glucose-Capillary: 139 mg/dL — ABNORMAL HIGH (ref 70–99)
Glucose-Capillary: 143 mg/dL — ABNORMAL HIGH (ref 70–99)
Glucose-Capillary: 144 mg/dL — ABNORMAL HIGH (ref 70–99)
Glucose-Capillary: 144 mg/dL — ABNORMAL HIGH (ref 70–99)
Glucose-Capillary: 144 mg/dL — ABNORMAL HIGH (ref 70–99)
Glucose-Capillary: 145 mg/dL — ABNORMAL HIGH (ref 70–99)
Glucose-Capillary: 147 mg/dL — ABNORMAL HIGH (ref 70–99)
Glucose-Capillary: 149 mg/dL — ABNORMAL HIGH (ref 70–99)
Glucose-Capillary: 152 mg/dL — ABNORMAL HIGH (ref 70–99)
Glucose-Capillary: 154 mg/dL — ABNORMAL HIGH (ref 70–99)
Glucose-Capillary: 157 mg/dL — ABNORMAL HIGH (ref 70–99)
Glucose-Capillary: 162 mg/dL — ABNORMAL HIGH (ref 70–99)
Glucose-Capillary: 164 mg/dL — ABNORMAL HIGH (ref 70–99)
Glucose-Capillary: 165 mg/dL — ABNORMAL HIGH (ref 70–99)
Glucose-Capillary: 168 mg/dL — ABNORMAL HIGH (ref 70–99)
Glucose-Capillary: 169 mg/dL — ABNORMAL HIGH (ref 70–99)
Glucose-Capillary: 170 mg/dL — ABNORMAL HIGH (ref 70–99)
Glucose-Capillary: 173 mg/dL — ABNORMAL HIGH (ref 70–99)
Glucose-Capillary: 175 mg/dL — ABNORMAL HIGH (ref 70–99)
Glucose-Capillary: 182 mg/dL — ABNORMAL HIGH (ref 70–99)
Glucose-Capillary: 187 mg/dL — ABNORMAL HIGH (ref 70–99)
Glucose-Capillary: 192 mg/dL — ABNORMAL HIGH (ref 70–99)
Glucose-Capillary: 195 mg/dL — ABNORMAL HIGH (ref 70–99)
Glucose-Capillary: 200 mg/dL — ABNORMAL HIGH (ref 70–99)
Glucose-Capillary: 207 mg/dL — ABNORMAL HIGH (ref 70–99)
Glucose-Capillary: 209 mg/dL — ABNORMAL HIGH (ref 70–99)
Glucose-Capillary: 212 mg/dL — ABNORMAL HIGH (ref 70–99)
Glucose-Capillary: 220 mg/dL — ABNORMAL HIGH (ref 70–99)
Glucose-Capillary: 226 mg/dL — ABNORMAL HIGH (ref 70–99)
Glucose-Capillary: 238 mg/dL — ABNORMAL HIGH (ref 70–99)
Glucose-Capillary: 244 mg/dL — ABNORMAL HIGH (ref 70–99)
Glucose-Capillary: 257 mg/dL — ABNORMAL HIGH (ref 70–99)
Glucose-Capillary: 267 mg/dL — ABNORMAL HIGH (ref 70–99)
Glucose-Capillary: 281 mg/dL — ABNORMAL HIGH (ref 70–99)
Glucose-Capillary: 303 mg/dL — ABNORMAL HIGH (ref 70–99)
Glucose-Capillary: 304 mg/dL — ABNORMAL HIGH (ref 70–99)
Glucose-Capillary: 325 mg/dL — ABNORMAL HIGH (ref 70–99)
Glucose-Capillary: 373 mg/dL — ABNORMAL HIGH (ref 70–99)
Glucose-Capillary: 427 mg/dL — ABNORMAL HIGH (ref 70–99)
Glucose-Capillary: 436 mg/dL — ABNORMAL HIGH (ref 70–99)
Glucose-Capillary: 67 mg/dL — ABNORMAL LOW (ref 70–99)
Glucose-Capillary: 80 mg/dL (ref 70–99)
Glucose-Capillary: 84 mg/dL (ref 70–99)
Glucose-Capillary: >600 mg/dL (ref 70–99)
Glucose-Capillary: >600 mg/dL (ref 70–99)

## 2011-04-13 LAB — CBC
HCT: 31.7 % — ABNORMAL LOW (ref 36.0–46.0)
HCT: 34.2 % — ABNORMAL LOW (ref 36.0–46.0)
HCT: 41 % (ref 36.0–46.0)
Hemoglobin: 10.9 g/dL — ABNORMAL LOW (ref 12.0–15.0)
Hemoglobin: 11.7 g/dL — ABNORMAL LOW (ref 12.0–15.0)
Hemoglobin: 14.1 g/dL (ref 12.0–15.0)
Hemoglobin: 17.1 g/dL — ABNORMAL HIGH (ref 12.0–15.0)
MCHC: 33.5 g/dL (ref 30.0–36.0)
MCHC: 34.4 g/dL (ref 30.0–36.0)
MCHC: 34.4 g/dL (ref 30.0–36.0)
MCHC: 34.4 g/dL (ref 30.0–36.0)
MCV: 90.2 fL (ref 78.0–100.0)
MCV: 91.4 fL (ref 78.0–100.0)
MCV: 91.6 fL (ref 78.0–100.0)
Platelets: 284 10*3/uL (ref 150–400)
RBC: 3.49 MIL/uL — ABNORMAL LOW (ref 3.87–5.11)
RBC: 3.97 MIL/uL (ref 3.87–5.11)
RBC: 5.44 MIL/uL — ABNORMAL HIGH (ref 3.87–5.11)
RDW: 12.9 % (ref 11.5–15.5)
RDW: 13 % (ref 11.5–15.5)
RDW: 13.3 % (ref 11.5–15.5)
RDW: 13.4 % (ref 11.5–15.5)
RDW: 13.4 % (ref 11.5–15.5)
WBC: 10.1 10*3/uL (ref 4.0–10.5)
WBC: 15.9 10*3/uL — ABNORMAL HIGH (ref 4.0–10.5)

## 2011-04-13 LAB — DIFFERENTIAL
Basophils Absolute: 0 10*3/uL (ref 0.0–0.1)
Basophils Absolute: 0 10*3/uL (ref 0.0–0.1)
Basophils Relative: 0 % (ref 0–1)
Basophils Relative: 0 % (ref 0–1)
Eosinophils Absolute: 0 10*3/uL (ref 0.0–0.7)
Eosinophils Absolute: 0 10*3/uL (ref 0.0–0.7)
Eosinophils Relative: 0 % (ref 0–5)
Monocytes Absolute: 1.7 10*3/uL — ABNORMAL HIGH (ref 0.1–1.0)
Monocytes Absolute: 2 10*3/uL — ABNORMAL HIGH (ref 0.1–1.0)
Neutro Abs: 24.7 10*3/uL — ABNORMAL HIGH (ref 1.7–7.7)

## 2011-04-13 LAB — COMPREHENSIVE METABOLIC PANEL
Alkaline Phosphatase: 101 U/L (ref 39–117)
BUN: 3 mg/dL — ABNORMAL LOW (ref 6–23)
CO2: 17 mEq/L — ABNORMAL LOW (ref 19–32)
Chloride: 110 mEq/L (ref 96–112)
Glucose, Bld: 175 mg/dL — ABNORMAL HIGH (ref 70–99)
Potassium: 3.2 mEq/L — ABNORMAL LOW (ref 3.5–5.1)
Total Bilirubin: 1.3 mg/dL — ABNORMAL HIGH (ref 0.3–1.2)

## 2011-04-13 LAB — HEPATIC FUNCTION PANEL
ALT: 20 U/L (ref 0–35)
AST: 13 U/L (ref 0–37)
Albumin: 2.6 g/dL — ABNORMAL LOW (ref 3.5–5.2)
Alkaline Phosphatase: 114 U/L (ref 39–117)
Bilirubin, Direct: 0.2 mg/dL (ref 0.0–0.3)
Total Bilirubin: 1.2 mg/dL (ref 0.3–1.2)

## 2011-04-13 LAB — B-NATRIURETIC PEPTIDE (CONVERTED LAB): Pro B Natriuretic peptide (BNP): 88 pg/mL (ref 0.0–100.0)

## 2011-04-13 LAB — NA AND K (SODIUM & POTASSIUM), RAND UR
Potassium Urine: 17 mEq/L
Sodium, Ur: 143 mEq/L

## 2011-04-13 LAB — MAGNESIUM
Magnesium: 1.6 mg/dL (ref 1.5–2.5)
Magnesium: 1.6 mg/dL (ref 1.5–2.5)

## 2011-04-13 LAB — URINE MICROSCOPIC-ADD ON

## 2011-04-13 LAB — POCT I-STAT 3, ART BLOOD GAS (G3+)
Acid-base deficit: 19 mmol/L — ABNORMAL HIGH (ref 0.0–2.0)
Bicarbonate: 7.8 mEq/L — ABNORMAL LOW (ref 20.0–24.0)
Patient temperature: 97.6
TCO2: 8 mmol/L (ref 0–100)

## 2011-04-13 LAB — ANAEROBIC CULTURE

## 2011-04-13 LAB — POCT CARDIAC MARKERS
CKMB, poc: 3.5 ng/mL (ref 1.0–8.0)
Myoglobin, poc: 100 ng/mL (ref 12–200)

## 2011-04-13 LAB — URINALYSIS, ROUTINE W REFLEX MICROSCOPIC
Ketones, ur: >80 mg/dL — AB
Ketones, ur: >80 mg/dL — AB
Leukocytes, UA: NEGATIVE
Leukocytes, UA: NEGATIVE
Nitrite: NEGATIVE
Nitrite: NEGATIVE
Protein, ur: 100 mg/dL — AB
Protein, ur: 30 mg/dL — AB
Urobilinogen, UA: 1 mg/dL (ref 0.0–1.0)
pH: 5.5 (ref 5.0–8.0)
pH: 6 (ref 5.0–8.0)

## 2011-04-13 LAB — URINE CULTURE

## 2011-04-13 LAB — KETONES, QUALITATIVE

## 2011-04-13 LAB — CULTURE, ROUTINE-ABSCESS

## 2011-04-13 LAB — LIPID PANEL
Cholesterol: 130 mg/dL (ref 0–200)
LDL Cholesterol: 90 mg/dL (ref 0–99)

## 2012-03-08 ENCOUNTER — Encounter (HOSPITAL_COMMUNITY): Payer: Self-pay | Admitting: *Deleted

## 2012-03-08 DIAGNOSIS — M545 Low back pain, unspecified: Secondary | ICD-10-CM | POA: Insufficient documentation

## 2012-03-08 LAB — URINALYSIS, ROUTINE W REFLEX MICROSCOPIC
Glucose, UA: >1000 mg/dL — AB
Hgb urine dipstick: NEGATIVE
Specific Gravity, Urine: 1.041 — ABNORMAL HIGH (ref 1.005–1.030)

## 2012-03-08 LAB — URINE MICROSCOPIC-ADD ON

## 2012-03-08 NOTE — ED Notes (Addendum)
Pt states she woke up this morning at 02:00 with lower back pain. Pt ambulatory. Pt denies bending, twisiting or lifting anything heavy. Pt states pain right in the middle of her lower back.

## 2012-03-09 ENCOUNTER — Emergency Department (HOSPITAL_COMMUNITY)
Admission: EM | Admit: 2012-03-09 | Discharge: 2012-03-09 | Disposition: A | Discharge status: Home / Self Care | Payer: Self-pay | Attending: Emergency Medicine | Admitting: Emergency Medicine

## 2012-03-09 DIAGNOSIS — E119 Type 2 diabetes mellitus without complications: Secondary | ICD-10-CM

## 2012-03-09 DIAGNOSIS — R81 Glycosuria: Secondary | ICD-10-CM

## 2012-03-09 DIAGNOSIS — M545 Low back pain: Secondary | ICD-10-CM

## 2012-03-09 DIAGNOSIS — E669 Obesity, unspecified: Secondary | ICD-10-CM

## 2012-03-09 DIAGNOSIS — R739 Hyperglycemia, unspecified: Secondary | ICD-10-CM

## 2012-03-09 LAB — GLUCOSE, CAPILLARY: Glucose-Capillary: 301 mg/dL — ABNORMAL HIGH (ref 70–99)

## 2012-03-09 MED ORDER — NAPROXEN 375 MG PO TABS: 375.0000 mg | ORAL_TABLET | Freq: Two times a day (BID) | ORAL | Status: DC

## 2012-03-09 MED ORDER — KETOROLAC TROMETHAMINE 30 MG/ML IJ SOLN
30.0000 mg | Freq: Once | INTRAMUSCULAR | Status: AC
  Administered 2012-03-09: 30 mg via INTRAVENOUS
  Filled 2012-03-09: qty 1

## 2012-03-09 MED ORDER — TRAMADOL HCL 50 MG PO TABS: 50.0000 mg | ORAL_TABLET | Freq: Four times a day (QID) | ORAL | Status: AC | PRN

## 2012-03-09 NOTE — ED Notes (Addendum)
Pt reports pain above her coccyx radiating up into her mid back starting last night. Pt denies any injury. Pt denies burning w/urination, abnormal vaginal bleeding, odor, or d/c, or hx of kidney stones. Pt denies any numbness or tingling, mae

## 2012-03-09 NOTE — ED Notes (Signed)
Family at bedside. 

## 2012-04-26 NOTE — ED Provider Notes (Signed)
History     CSN: [id number]  Arrival date & time 03/08/12  2037   First MD Initiated Contact with Patient 03/09/12 [phone]      Chief Complaint  Patient presents with  . Back Pain    (Consider location/radiation/quality/duration/timing/severity/associated sxs/prior treatment) HPI  Please note that this is a late entry. This patient was seen by me on 03/09/12. Patient is a 41 year old insulin-dependent diabetic woman who presents with complaints of low back pain. Her pain began yesterday after a sensation of straining while turning/rotating the torso. The patient has bilateral low back pain right greater than left. She denies radiation of pain. She denies midline pain. She denies any focal neurologic deficits and genitourinary symptoms.  The patient describes her pain as moderate to severe, aching. Worse with certain movements of the torso.No h/o blunt trauma.   Patient reports compliance with insulin regimen.   Past Medical History  Diagnosis Date  . Diabetes mellitus     History reviewed. No pertinent past surgical history.  History reviewed. No pertinent family history.  History  Substance Use Topics  . Smoking status: Never Smoker   . Smokeless tobacco: Not on file  . Alcohol Use: No    OB History    Grav Para Term Preterm Abortions TAB SAB Ect Mult Living                  Review of Systems  Gen: no weight loss, fevers, chills, night sweats Eyes: no discharge or drainage, no occular pain or visual changes Nose: no epistaxis or rhinorrhea Mouth: no dental pain, no sore throat Neck: no neck pain Lungs: no SOB, cough, wheezing CV: no chest pain, palpitations, dependent edema or orthopnea Abd: no abdominal pain, nausea, vomiting GU: no dysuria or gross hematuria MSK: As per history of present illness, otherwise negative Neuro: no headache, no focal neurologic deficits Skin: no rash Endo: no recent wt changes, no polyuria or polydypsia, h/o IDDM  Allergies    Review of patient's allergies indicates no known allergies.  Home Medications   Current Outpatient Rx  Name Route Sig Dispense Refill  . INSULIN ASPART 100 UNIT/ML [place] SOLN Subcutaneous Inject 30 Units into the skin 4 (four) times daily. Per sliding scale    . INSULIN GLARGINE 100 UNIT/ML [place] SOLN Subcutaneous Inject 20 Units into the skin at bedtime as needed. For high glucose    . NAPROXEN 375 MG PO TABS Oral Take 1 tablet (375 mg total) by mouth 2 (two) times daily. 20 tablet 0    BP 115/81  Pulse 63  Temp 97 F (36.1 C) (Oral)  Resp 18  SpO2 96%  Physical Exam Gen: well developed and well nourished appearing Head: NCAT Eyes: PERL, EOMI Nose: no epistaixis or rhinorrhea Mouth/throat: mucosa is moist and pink Neck: supple, no stridor Lungs: CTA B, no wheezing, rhonchi or rales Abd: soft, obese, notender, nondistended Back: no midline ttp, no cva ttp, ttp bilaterally over the paraspinal musculature in the lumbar region. Skin: no rashese, wnl Neuro: CN ii-xii grossly intact, no focal deficits Psyche; normal affect,  calm and cooperative.   ED Course  Procedures (including critical care time)  Labs:  U/A notable for glucosuria. Accucheck of 300. No signs of UTI  Patient with LBP which appears to be myofascial in nature. We will manage symptomatically. Patient notified of incidental finding of hyperglycemia with BG of 300 and glucosuria. She is counseled re: importance of medical compliance and f/u in the next 48  hrs.   1. Back pain, lumbosacral   2. Diabetes mellitus   3. Glucosuria   4. Obesity   5. Hyperglycemia       MDM  Please see above.         Elyn Peers, MD 04/26/12 [phone]

## 2012-12-22 ENCOUNTER — Emergency Department (HOSPITAL_COMMUNITY)
Admission: EM | Admit: 2012-12-22 | Discharge: 2012-12-23 | Disposition: A | Discharge status: Home / Self Care | Payer: Self-pay | Attending: Emergency Medicine | Admitting: Emergency Medicine

## 2012-12-22 ENCOUNTER — Encounter (HOSPITAL_COMMUNITY): Payer: Self-pay | Admitting: Emergency Medicine

## 2012-12-22 DIAGNOSIS — H6120 Impacted cerumen, unspecified ear: Secondary | ICD-10-CM | POA: Insufficient documentation

## 2012-12-22 DIAGNOSIS — H6121 Impacted cerumen, right ear: Secondary | ICD-10-CM

## 2012-12-22 DIAGNOSIS — E119 Type 2 diabetes mellitus without complications: Secondary | ICD-10-CM | POA: Insufficient documentation

## 2012-12-22 MED ORDER — SODIUM CHLORIDE 0.9 % IJ SOLN: 10.0000 mL | Freq: Once | INTRAMUSCULAR | Status: DC

## 2012-12-22 MED ORDER — DOCUSATE SODIUM 50 MG/5ML PO LIQD
100.0000 mg | Freq: Once | ORAL | Status: AC
  Administered 2012-12-22: 100 mg via ORAL
  Filled 2012-12-22: qty 10

## 2012-12-22 NOTE — ED Notes (Signed)
Ear rinsed with Sodium Chloride injection. Able to remove small amount of ear wax. Able to visualize ear drum afterwards.

## 2012-12-22 NOTE — ED Notes (Signed)
PT. REPORTS RIGHT EAR DISCOMFORT " WAX BUILD UP"  ONSET TODAY DENIES DRAINAGE OR INJURY.

## 2012-12-22 NOTE — ED Provider Notes (Signed)
History    This chart was scribed for Melanie Hall, a non-physician practitioner working with Julianne Rice, MD by Denice Bors, ED Scribe. This patient was seen in room TR08C/TR08C and the patient's care was started at 2244.      CSN: [id number]  Arrival date & time 12/22/12  2110   First MD Initiated Contact with Patient 12/22/12 2204      Chief Complaint  Patient presents with  . Otalgia    (Consider location/radiation/quality/duration/timing/severity/associated sxs/prior treatment) The history is provided by the patient.   Marland KitchenHPI Comments: [name] is a 42 y.o. female who presents to the Emergency Department complaining of constant moderate right ear pain onset acute PTA after using Q-tip to clean ear. Describes pain 5/10 in severity, sharp, and non-radiating. Reports associated decreased hearing of right ear. Denies associated ear drainage, fever, chills, and congestion. Denies any alleviating or aggravating factors. Denies taking any pain medication PTA to relieve pain.   Past Medical History  Diagnosis Date  . Diabetes mellitus     Past Surgical History  Procedure Laterality Date  . Tubal ligation    . Knee surgery      No family history on file.  History  Substance Use Topics  . Smoking status: Never Smoker   . Smokeless tobacco: Not on file  . Alcohol Use: No    OB History   Grav Para Term Preterm Abortions TAB SAB Ect Mult Living                  Review of Systems  Constitutional: Negative for fever.  HENT: Positive for hearing loss and ear pain. Negative for ear discharge.   Psychiatric/Behavioral: Negative for confusion.    Allergies  Review of patient's allergies indicates no known allergies.  Home Medications   Current Outpatient Rx  Name  Route  Sig  Dispense  Refill  . aspirin-acetaminophen-caffeine (EXCEDRIN MIGRAINE) 250-250-65 MG per tablet   Oral   Take 2 tablets by mouth 2 (two) times daily as needed for pain.  For headache           BP 134/78  Pulse 99  Temp(Src) 98.2 F (36.8 C) (Oral)  Resp 14  SpO2 97%  LMP 12/04/2012  Physical Exam  Nursing note and vitals reviewed. Constitutional: She is oriented to person, place, and time. She appears well-developed and well-nourished. No distress.  HENT:  Head: Normocephalic and atraumatic.  Right Ear: External ear normal. Decreased hearing (midly) is noted.  Left Ear: Hearing, tympanic membrane and external ear normal.  Cerumen impaction of right ear   Eyes: Conjunctivae and EOM are normal.  Neck: Neck supple. No tracheal deviation present.  Cardiovascular: Normal rate.   Pulmonary/Chest: Effort normal. No respiratory distress.  Musculoskeletal: Normal range of motion.  Neurological: She is alert and oriented to person, place, and time.  Skin: Skin is warm and dry.  Psychiatric: She has a normal mood and affect. Her behavior is normal.    ED Course  Procedures (including critical care time)  Medications  sodium chloride 0.9 % injection 10 mL (not administered)  docusate (COLACE) 50 MG/5ML liquid 100 mg (100 mg Oral Given 12/22/12 2310)    Labs Reviewed - No data to display No results found.   1. Cerumen impaction, right       MDM  Patient with cerumen impaction on examination and mildly decreased hearing. Serum impaction was removed using medications with the above with resolution in pain and symptoms.  I may insemination no evidence of TM injury or perforation noted. Patient advised to obtain a primary care doctor to followup with. Return precautions discussed. Patient agreeable to plan. Patient stable at time of discharge.     I personally performed the services described in this documentation, which was scribed in my presence. The recorded information has been reviewed and is accurate.      Harlow Mares, PA-C 12/23/12 0008

## 2012-12-22 NOTE — ED Notes (Signed)
Pt states she was cleaning her ears with a Q-tip and now she is having difficulty hearing from her right ear. Pt states she has some pain 5/10 but is mostly concerned with not being able to hear. Visible wax buildup in rt ear.

## 2012-12-25 NOTE — ED Provider Notes (Signed)
Medical screening examination/treatment/procedure(s) were performed by non-physician practitioner and as supervising physician I was immediately available for consultation/collaboration.   Julianne Rice, MD 12/25/12 [phone]

## 2013-10-27 ENCOUNTER — Emergency Department (HOSPITAL_COMMUNITY): Payer: Self-pay

## 2013-10-27 ENCOUNTER — Emergency Department (HOSPITAL_COMMUNITY)
Admission: EM | Admit: 2013-10-27 | Discharge: 2013-10-28 | Disposition: A | Discharge status: Home / Self Care | Payer: Self-pay | Attending: Emergency Medicine | Admitting: Emergency Medicine

## 2013-10-27 DIAGNOSIS — K8050 Calculus of bile duct without cholangitis or cholecystitis without obstruction: Secondary | ICD-10-CM

## 2013-10-27 DIAGNOSIS — R739 Hyperglycemia, unspecified: Secondary | ICD-10-CM

## 2013-10-27 DIAGNOSIS — Z3202 Encounter for pregnancy test, result negative: Secondary | ICD-10-CM | POA: Insufficient documentation

## 2013-10-27 DIAGNOSIS — K8020 Calculus of gallbladder without cholecystitis without obstruction: Secondary | ICD-10-CM | POA: Insufficient documentation

## 2013-10-27 DIAGNOSIS — E119 Type 2 diabetes mellitus without complications: Secondary | ICD-10-CM | POA: Insufficient documentation

## 2013-10-27 LAB — COMPREHENSIVE METABOLIC PANEL
ALT: 13 U/L (ref 0–35)
Albumin: 4 g/dL (ref 3.5–5.2)
Alkaline Phosphatase: 103 U/L (ref 39–117)
BUN: 18 mg/dL (ref 6–23)
Chloride: 94 mEq/L — ABNORMAL LOW (ref 96–112)
Potassium: 4.4 mEq/L (ref 3.7–5.3)
Sodium: 131 mEq/L — ABNORMAL LOW (ref 137–147)
Total Bilirubin: 0.5 mg/dL (ref 0.3–1.2)

## 2013-10-27 LAB — I-STAT ARTERIAL BLOOD GAS, ED
Acid-Base Excess: 1 mmol/L (ref 0.0–2.0)
Bicarbonate: 25.7 meq/L — ABNORMAL HIGH (ref 20.0–24.0)
O2 Saturation: 92 %
Patient temperature: 98.9
TCO2: 27 mmol/L (ref 0–100)
pCO2 arterial: 41.7 mmHg (ref 35.0–45.0)
pH, Arterial: 7.398 (ref 7.350–7.450)
pO2, Arterial: 63 mmHg — ABNORMAL LOW (ref 80.0–100.0)

## 2013-10-27 LAB — CBG MONITORING, ED
Glucose-Capillary: 445 mg/dL — ABNORMAL HIGH (ref 70–99)
Glucose-Capillary: 390 mg/dL — ABNORMAL HIGH (ref 70–99)
Glucose-Capillary: 364 mg/dL — ABNORMAL HIGH (ref 70–99)

## 2013-10-27 LAB — CBC WITH DIFFERENTIAL/PLATELET
Basophils Absolute: 0 K/uL (ref 0.0–0.1)
Basophils Relative: 1 % (ref 0–1)
Eosinophils Absolute: 0.4 K/uL (ref 0.0–0.7)
Eosinophils Relative: 5 % (ref 0–5)
HCT: 40.7 % (ref 36.0–46.0)
Hemoglobin: 14.9 g/dL (ref 12.0–15.0)
Lymphocytes Relative: 40 % (ref 12–46)
Lymphs Abs: 3.4 10*3/uL (ref 0.7–4.0)
MCH: 32.3 pg (ref 26.0–34.0)
MCHC: 36.6 g/dL — ABNORMAL HIGH (ref 30.0–36.0)
MCV: 88.3 fL (ref 78.0–100.0)
Monocytes Absolute: 0.6 K/uL (ref 0.1–1.0)
Monocytes Relative: 7 % (ref 3–12)
Neutro Abs: 4 10*3/uL (ref 1.7–7.7)
Neutrophils Relative %: 47 % (ref 43–77)
Platelets: 242 10*3/uL (ref 150–400)
RBC: 4.61 MIL/uL (ref 3.87–5.11)
RDW: 12.2 % (ref 11.5–15.5)
WBC: 8.5 K/uL (ref 4.0–10.5)

## 2013-10-27 LAB — COMPREHENSIVE METABOLIC PANEL WITH GFR
AST: 20 U/L (ref 0–37)
CO2: 23 meq/L (ref 19–32)
Calcium: 9.7 mg/dL (ref 8.4–10.5)
Creatinine, Ser: 0.52 mg/dL (ref 0.50–1.10)
GFR calc Af Amer: >90 mL/min (ref >90)
GFR calc non Af Amer: >90 mL/min (ref >90)
Glucose, Bld: 526 mg/dL — ABNORMAL HIGH (ref 70–99)
Total Protein: 8 g/dL (ref 6.0–8.3)

## 2013-10-27 LAB — URINALYSIS, ROUTINE W REFLEX MICROSCOPIC
Bilirubin Urine: NEGATIVE
Glucose, UA: >1000 mg/dL — AB
Hgb urine dipstick: NEGATIVE
Ketones, ur: NEGATIVE mg/dL
Leukocytes, UA: NEGATIVE
Nitrite: POSITIVE — AB
Protein, ur: NEGATIVE mg/dL
Specific Gravity, Urine: 1.036 — ABNORMAL HIGH (ref 1.005–1.030)
Urobilinogen, UA: 1 mg/dL (ref 0.0–1.0)
pH: 5.5 (ref 5.0–8.0)

## 2013-10-27 LAB — URINE MICROSCOPIC-ADD ON

## 2013-10-27 LAB — LIPASE, BLOOD: Lipase: 62 U/L — ABNORMAL HIGH (ref 11–59)

## 2013-10-27 LAB — POC URINE PREG, ED: Preg Test, Ur: NEGATIVE

## 2013-10-27 IMAGING — US US ABDOMEN COMPLETE
1 series · 14 of 25 positions shown · non-contrast
Comparison: DG PELVIS 1-2 VIEWS dated [DATE]; US RENAL dated
[DATE]; US ABDOMEN COMPLETE dated [DATE]

CLINICAL DATA: Right upper quadrant pain

EXAM:
ULTRASOUND ABDOMEN COMPLETE

[Series 1: us abdomen complete · 0.26mm/px · 14 of 44 slices shown]
[im 1/44]
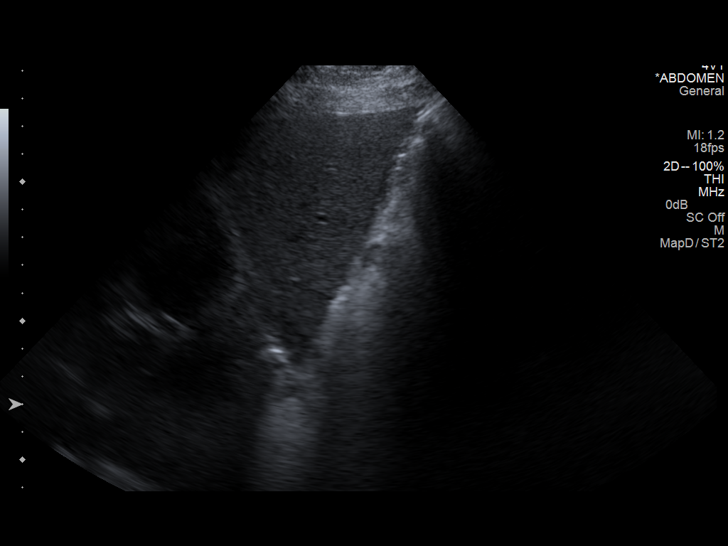
[im 4/44]
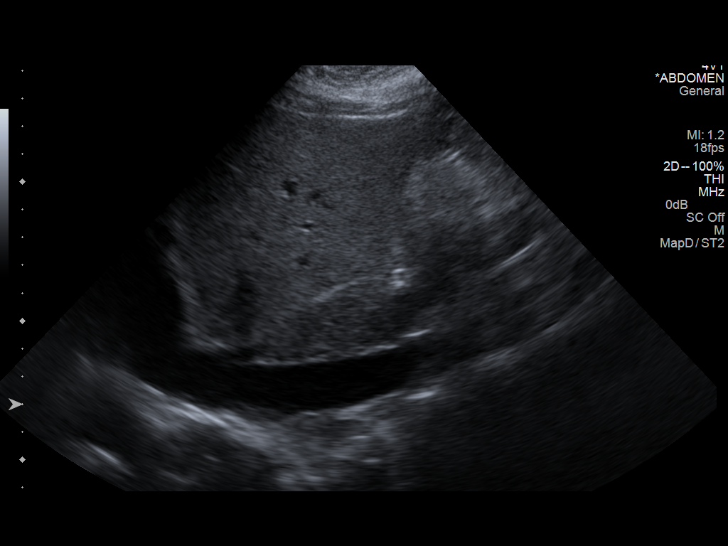
[im 8/44]
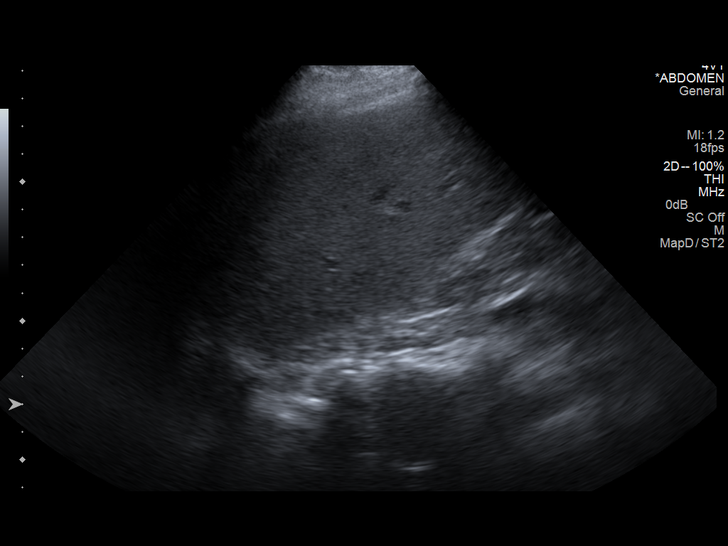
[im 11/44]
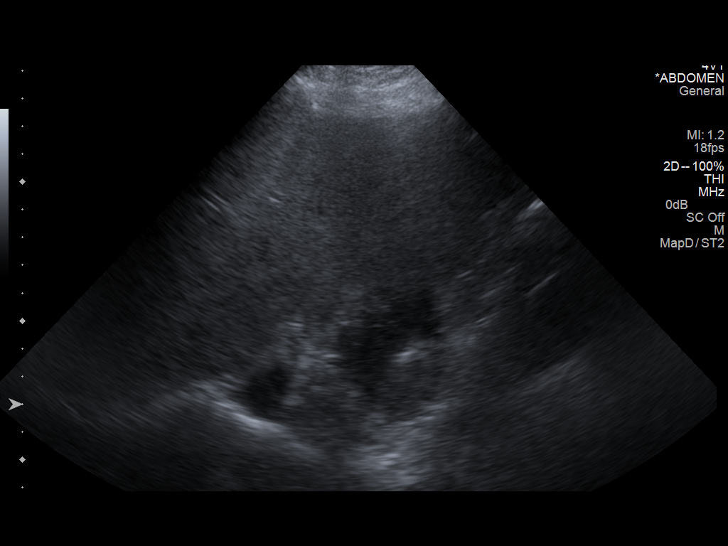
[im 15/44]
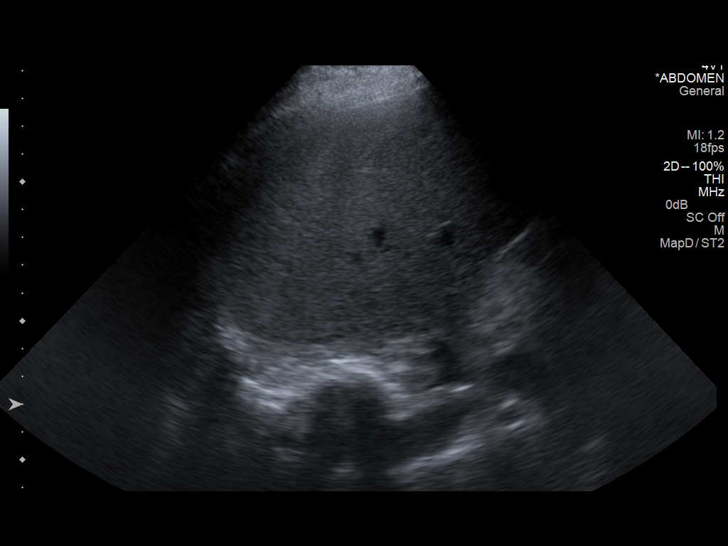
[im 17/44]
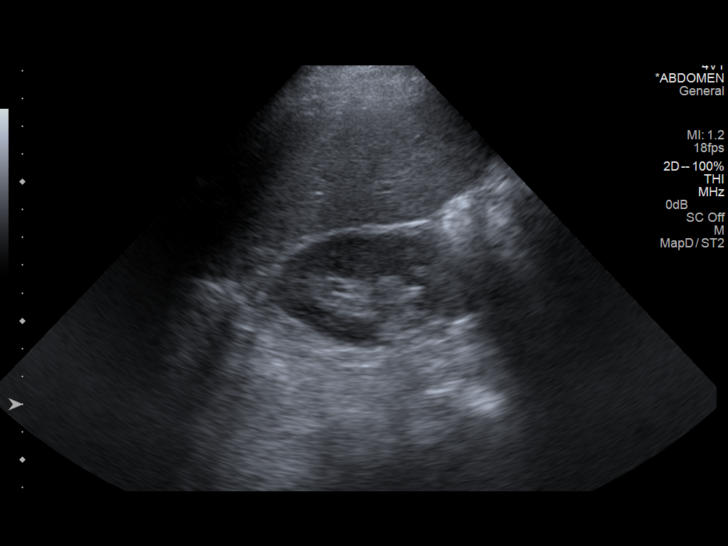
[im 20/44]
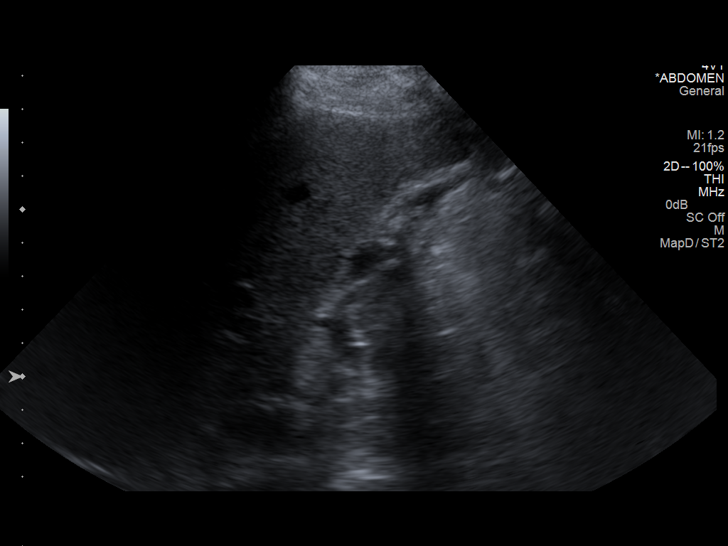
[im 24/44]
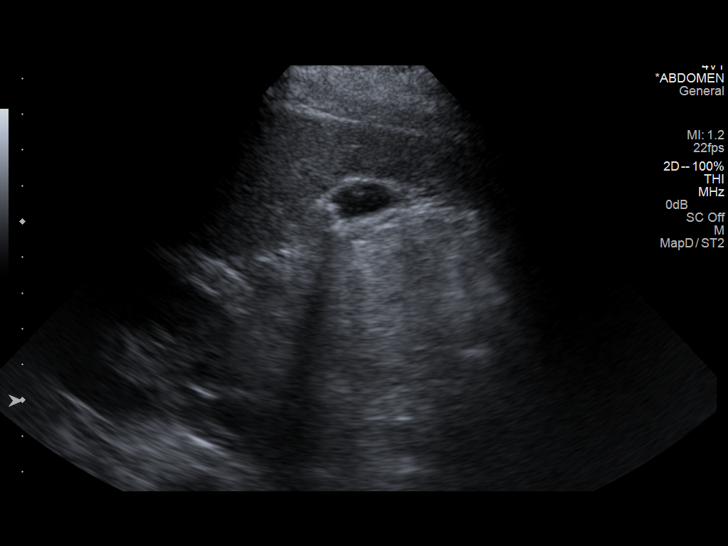
[im 27/44]
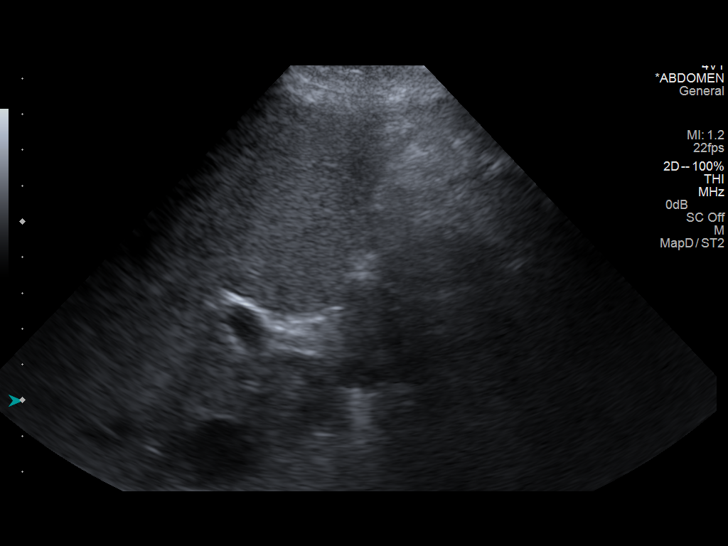
[im 29/44]
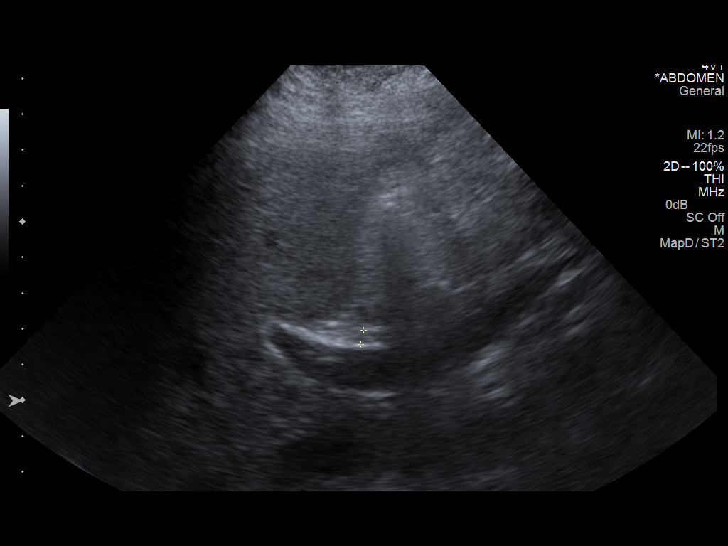
[im 33/44]
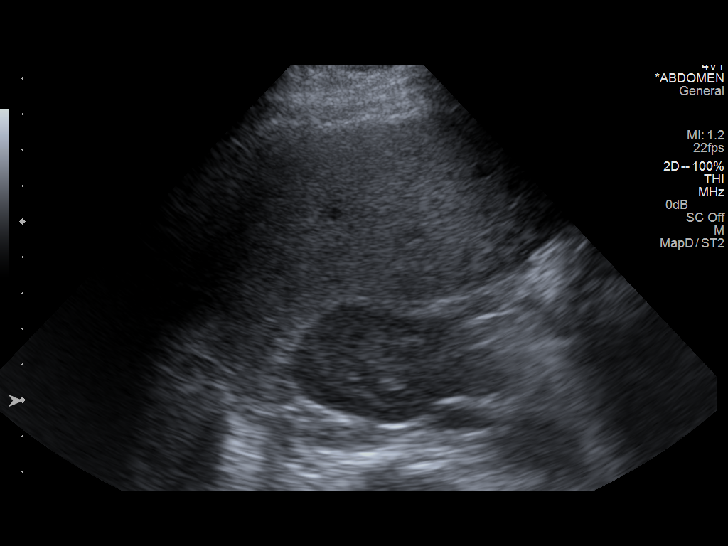
[im 36/44]
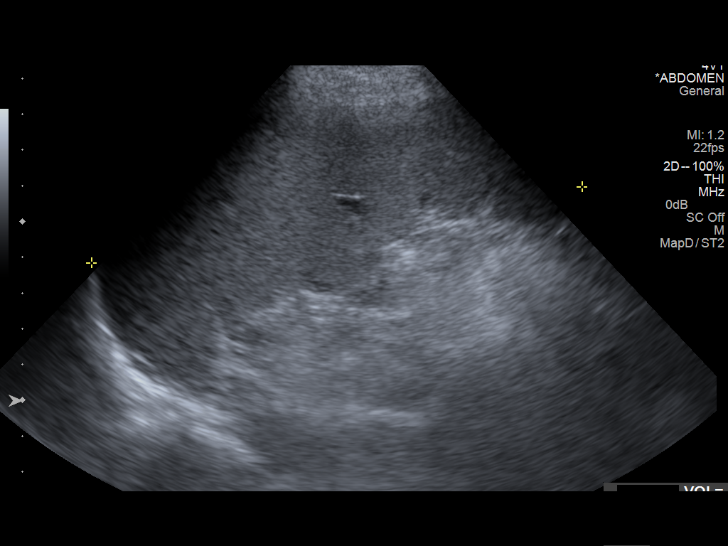
[im 40/44]
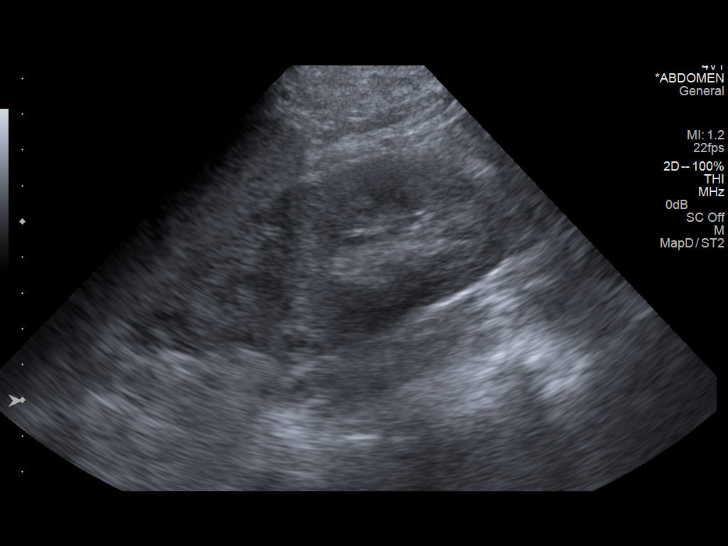
[im 44/44]
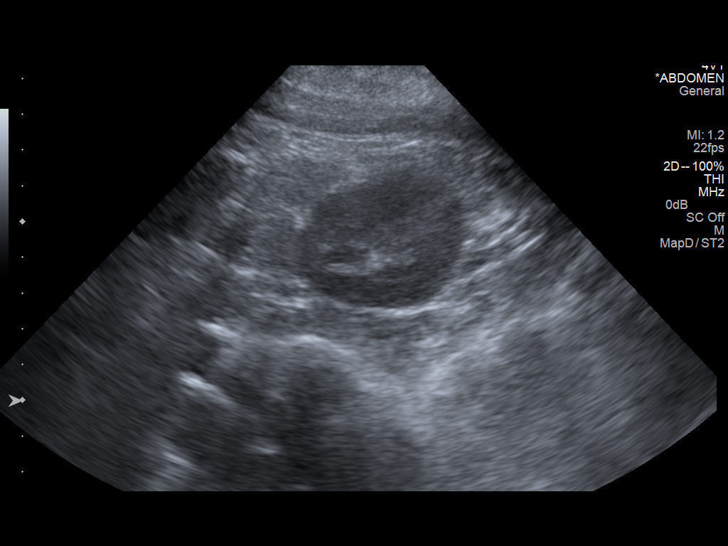

[14 of 25 positions shown; findings below may reference images not displayed]

FINDINGS: Gallbladder:

Multiple stones. The largest is 5 mm. Gallbladder is contracted. No
wall thickening or Murphy's sign.

Common bile duct:

Diameter: 4 mm in caliber.

Liver:

No focal lesion identified. Within normal limits in parenchymal
echogenicity.

IVC:

No abnormality visualized.

Pancreas:

Obscured by overlying bowel gas.

Spleen:

Size and appearance within normal limits.

Right Kidney:

Length: 11.3 cm in length. Echogenicity within normal limits. No
mass or hydronephrosis visualized.

Left Kidney:

Length: 13.0 cm in length. Echogenicity within normal limits. No
mass or hydronephrosis visualized.

Abdominal aorta:

No aneurysm visualized.

Other findings:

None.
IMPRESSION: Cholelithiasis.

## 2013-10-27 MED ORDER — ONDANSETRON HCL 4 MG/2ML IJ SOLN
4.0000 mg | Freq: Once | INTRAMUSCULAR | Status: AC
  Administered 2013-10-27: 4 mg via INTRAVENOUS
  Filled 2013-10-27: qty 2

## 2013-10-27 MED ORDER — ONDANSETRON 4 MG PO TBDP
8.0000 mg | ORAL_TABLET | Freq: Once | ORAL | Status: AC
  Administered 2013-10-27: 8 mg via ORAL
  Filled 2013-10-27: qty 2

## 2013-10-27 MED ORDER — MORPHINE SULFATE 4 MG/ML IJ SOLN
4.0000 mg | Freq: Once | INTRAMUSCULAR | Status: AC
  Administered 2013-10-27: 4 mg via INTRAVENOUS
  Filled 2013-10-27: qty 1

## 2013-10-27 MED ORDER — SODIUM CHLORIDE 0.9 % IV BOLUS (SEPSIS)
1000.0000 mL | Freq: Once | INTRAVENOUS | Status: AC
  Administered 2013-10-27: 1000 mL via INTRAVENOUS

## 2013-10-27 MED ORDER — SODIUM CHLORIDE 0.9 % IV SOLN
INTRAVENOUS | Status: DC
  Administered 2013-10-27: 3.3 [IU]/h via INTRAVENOUS
  Filled 2013-10-27: qty 1

## 2013-10-27 NOTE — ED Provider Notes (Signed)
CSN: [id number]     Arrival date & time 10/27/13  1957 History   First MD Initiated Contact with Patient 10/27/13 2025     Chief Complaint  Patient presents with  . Abdominal Pain  . Back Pain     (Consider location/radiation/quality/duration/timing/severity/associated sxs/prior Treatment) HPI  Patient to the ER with a PMH of diabetes presents to the ER with complaints of abdominal pain and back pain for 1 week that has been progressively worsening over the past week. She denies having a hx of pancreatitis. Denies drinking alcohol. She has been having some nausea but has not vomited. Her LMP was on 3/15, she has not had any diarrhea, constipation, abnormal bowel movements, fevers, chills, weakness, confusion, increased thirst. Her CBG in triage is noted to be > 500. The patient tells me she is unimpressed by this as it usually runs high and than she has seen it higher than 700. Pt awake and alert, resting comfortably in stretcher.   Past Medical History  Diagnosis Date  . Diabetes mellitus    Past Surgical History  Procedure Laterality Date  . Tubal ligation    . Knee surgery     No family history on file. History  Substance Use Topics  . Smoking status: Never Smoker   . Smokeless tobacco: Not on file  . Alcohol Use: No   OB History   Grav Para Term Preterm Abortions TAB SAB Ect Mult Living                 Review of Systems   Review of Systems  Gen: no weight loss, fevers, chills, night sweats  Eyes: no discharge or drainage, no occular pain or visual changes  Nose: no epistaxis or rhinorrhea  Mouth: no dental pain, no sore throat  Neck: no neck pain  Lungs:No wheezing, coughing or hemoptysis CV: no chest pain, palpitations, dependent edema or orthopnea  Abd: + abdominal pain, nausea, No vomiting, diarrhea GU: no dysuria or gross hematuria  MSK:  No muscle weakness or pain Neuro: no headache, no focal neurologic deficits  Skin: no rash or wounds Psyche: no  complaints    Allergies  Review of patient's allergies indicates no known allergies.  Home Medications   Prior to Admission medications   Not on File   BP 104/72  Pulse 93  Temp(Src) 98.9 F (37.2 C) (Oral)  Resp 20  Ht 5\' 3"  (1.6 m)  Wt 141 lb (63.957 kg)  BMI 24.98 kg/m2  SpO2 99%  LMP 09/20/2013 Physical Exam  Nursing note and vitals reviewed. Constitutional: She appears well-developed and well-nourished. No distress.  HENT:  Head: Normocephalic and atraumatic.  Eyes: Pupils are equal, round, and reactive to light.  Neck: Normal range of motion. Neck supple.  Cardiovascular: Normal rate and regular rhythm.   Pulmonary/Chest: Effort normal.  Abdominal: Soft. Bowel sounds are normal. She exhibits no distension. There is tenderness in the right upper quadrant and epigastric area. There is no guarding.    Neurological: She is alert.  Skin: Skin is warm and dry.      ED Course  Procedures (including critical care time) Labs Review Labs Reviewed  CBC WITH DIFFERENTIAL - Abnormal; Notable for the following:    MCHC 36.6 (*)    All other components within normal limits  COMPREHENSIVE METABOLIC PANEL - Abnormal; Notable for the following:    Sodium 131 (*)    Chloride 94 (*)    Glucose, Bld 526 (*)  All other components within normal limits  LIPASE, BLOOD - Abnormal; Notable for the following:    Lipase 62 (*)    All other components within normal limits  URINALYSIS, ROUTINE W REFLEX MICROSCOPIC - Abnormal; Notable for the following:    Specific Gravity, Urine 1.036 (*)    Glucose, UA >1000 (*)    Nitrite POSITIVE (*)    All other components within normal limits  URINE MICROSCOPIC-ADD ON - Abnormal; Notable for the following:    Squamous Epithelial / LPF FEW (*)    Bacteria, UA FEW (*)    All other components within normal limits  CBG MONITORING, ED - Abnormal; Notable for the following:    Glucose-Capillary 445 (*)    All other components within normal  limits  I-STAT ARTERIAL BLOOD GAS, ED - Abnormal; Notable for the following:    pO2, Arterial 63.0 (*)    Bicarbonate 25.7 (*)    All other components within normal limits  CBG MONITORING, ED - Abnormal; Notable for the following:    Glucose-Capillary 390 (*)    All other components within normal limits  CBG MONITORING, ED - Abnormal; Notable for the following:    Glucose-Capillary 364 (*)    All other components within normal limits  CBG MONITORING, ED - Abnormal; Notable for the following:    Glucose-Capillary 274 (*)    All other components within normal limits  URINE CULTURE  BLOOD GAS, ARTERIAL  POC URINE PREG, ED    Imaging Review US Abdomen Complete  10/27/2013   CLINICAL DATA:  Right upper quadrant pain  EXAM: ULTRASOUND ABDOMEN COMPLETE  COMPARISON:  DG PELVIS 1-2 VIEWS dated 02/25/2010; US RENAL dated 06/26/2008; US ABDOMEN COMPLETE dated 04/02/2005  FINDINGS: Gallbladder:  Multiple stones. The largest is 5 mm. Gallbladder is contracted. No wall thickening or Murphy's sign.  Common bile duct:  Diameter: 4 mm in caliber.  Liver:  No focal lesion identified. Within normal limits in parenchymal echogenicity.  IVC:  No abnormality visualized.  Pancreas:  Obscured by overlying bowel gas.  Spleen:  Size and appearance within normal limits.  Right Kidney:  Length: 11.3 cm in length. Echogenicity within normal limits. No mass or hydronephrosis visualized.  Left Kidney:  Length: 13.0 cm in length. Echogenicity within normal limits. No mass or hydronephrosis visualized.  Abdominal aorta:  No aneurysm visualized.  Other findings:  None.  IMPRESSION: Cholelithiasis.   Electronically Signed   By: Maryclare Bean M.D.   On: 10/27/2013 22:08     EKG Interpretation None      MDM   Final diagnoses:  Biliary colic  Hyperglycemia    Patients blood work shows signs of biliary colic. She has cholelithiasis without signs out acute cholecystitis. She has no white count and a low level lipase. Her [place]  returned unremarkable. She was started on the gluco stabilizer and her sugar improved to 274. The patient at this time requests if she can go home. She denies needing refill or any of her diabetes medications. She is no longer having severe pain. Discussed elective surgery and referral to [facility] given.  43 y.o.Melanie Hall's evaluation in the Emergency Department is complete. It has been determined that no acute conditions requiring further emergency intervention are present at this time. The patient/guardian have been advised of the diagnosis and plan. We have discussed signs and symptoms that warrant return to the ED, such as changes or worsening in symptoms.  Vital signs are stable at discharge. Filed  Vitals:   10/28/13 0000  BP: 104/72  Pulse: 93  Temp:   Resp:     Patient/guardian has voiced understanding and agreed to follow-up with the PCP or specialist.     Linus Mako, PA-C 10/28/13 [phone]

## 2013-10-27 NOTE — ED Notes (Signed)
Pt c/o abdominal pain and back pain for a week. Pt denies abnormal BM or dysuria. LMP 3/15. Pt reports n/v. Pt A&Ox4, respirations equal and unlabored, skin warm and dry

## 2013-10-27 NOTE — ED Notes (Signed)
PA at the bedside.

## 2013-10-27 NOTE — ED Notes (Signed)
Patient transported to Ultrasound 

## 2013-10-28 LAB — CBG MONITORING, ED: Glucose-Capillary: 274 mg/dL — ABNORMAL HIGH (ref 70–99)

## 2013-10-28 MED ORDER — PROMETHAZINE HCL 25 MG PO TABS: 25.0000 mg | ORAL_TABLET | Freq: Four times a day (QID) | ORAL | Status: DC | PRN

## 2013-10-28 MED ORDER — MORPHINE SULFATE 4 MG/ML IJ SOLN
4.0000 mg | Freq: Once | INTRAMUSCULAR | Status: AC
  Administered 2013-10-28: 4 mg via INTRAVENOUS
  Filled 2013-10-28: qty 1

## 2013-10-28 MED ORDER — OXYCODONE-ACETAMINOPHEN 5-325 MG PO TABS: 1.0000 | ORAL_TABLET | Freq: Four times a day (QID) | ORAL | Status: DC | PRN

## 2013-10-28 NOTE — Discharge Instructions (Signed)
Biliary Colic  °Biliary colic is a steady or irregular pain in the upper abdomen. It is usually under the right side of the rib cage. It happens when gallstones interfere with the normal flow of bile from the gallbladder. Bile is a liquid that helps to digest fats. Bile is made in the liver and stored in the gallbladder. When you eat a meal, bile passes from the gallbladder through the cystic duct and the common bile duct into the small intestine. There, it mixes with partially digested food. If a gallstone blocks either of these ducts, the normal flow of bile is blocked. The muscle cells in the bile duct contract forcefully to try to move the stone. This causes the pain of biliary colic.  °SYMPTOMS  °· A person with biliary colic usually complains of pain in the upper abdomen. This pain can be: °· In the center of the upper abdomen just below the breastbone. °· In the upper-right part of the abdomen, near the gallbladder and liver. °· Spread back toward the right shoulder blade. °· Nausea and vomiting. °· The pain usually occurs after eating. °· Biliary colic is usually triggered by the digestive system's demand for bile. The demand for bile is high after fatty meals. Symptoms can also occur when a person who has been fasting suddenly eats a very large meal. Most episodes of biliary colic pass after 1 to 5 hours. After the most intense pain passes, your abdomen may continue to ache mildly for about 24 hours. °DIAGNOSIS  °After you describe your symptoms, your caregiver will perform a physical exam. He or she will pay attention to the upper right portion of your belly (abdomen). This is the area of your liver and gallbladder. An ultrasound will help your caregiver look for gallstones. Specialized scans of the gallbladder may also be done. Blood tests may be done, especially if you have fever or if your pain persists. °PREVENTION  °Biliary colic can be prevented by controlling the risk factors for gallstones. Some of  these risk factors, such as heredity, increasing age, and pregnancy are a normal part of life. Obesity and a high-fat diet are risk factors you can change through a healthy lifestyle. Women going through menopause who take hormone replacement therapy (estrogen) are also more likely to develop biliary colic. °TREATMENT  °· Pain medication may be prescribed. °· You may be encouraged to eat a fat-free diet. °· If the first episode of biliary colic is severe, or episodes of colic keep retuning, surgery to remove the gallbladder (cholecystectomy) is usually recommended. This procedure can be done through small incisions using an instrument called a laparoscope. The procedure often requires a brief stay in the hospital. Some people can leave the hospital the same day. It is the most widely used treatment in people troubled by painful gallstones. It is effective and safe, with no complications in more than 90% of cases. °· If surgery cannot be done, medication that dissolves gallstones may be used. This medication is expensive and can take months or years to work. Only small stones will dissolve. °· Rarely, medication to dissolve gallstones is combined with a procedure called shock-wave lithotripsy. This procedure uses carefully aimed shock waves to break up gallstones. In many people treated with this procedure, gallstones form again within a few years. °PROGNOSIS  °If gallstones block your cystic duct or common bile duct, you are at risk for repeated episodes of biliary colic. There is also a 25% chance that you will develop   a gallbladder infection(acute cholecystitis), or some other complication of gallstones within 10 to 20 years. If you have surgery, schedule it at a time that is convenient for you and at a time when you are not sick. HOME CARE INSTRUCTIONS   Drink plenty of clear fluids.  Avoid fatty, greasy or fried foods, or any foods that make your pain worse.  Take medications as directed. SEEK MEDICAL  CARE IF:   You develop a fever over 100.5 F (38.1 C).  Your pain gets worse over time.  You develop nausea that prevents you from eating and drinking.  You develop vomiting. SEEK IMMEDIATE MEDICAL CARE IF:   You have continuous or severe belly (abdominal) pain which is not relieved with medications.  You develop nausea and vomiting which is not relieved with medications.  You have symptoms of biliary colic and you suddenly develop a fever and shaking chills. This may signal cholecystitis. Call your caregiver immediately.  You develop a yellow color to your skin or the white part of your eyes (jaundice). Document Released: 11/26/2005 Document Revised: 09/17/2011 Document Reviewed: 02/05/2008 [facility] Patient Information 2014 [place].   Cholelithiasis Cholelithiasis (also called gallstones) is a form of gallbladder disease in which gallstones form in your gallbladder. The gallbladder is an organ that stores bile made in the liver, which helps digest fats. Gallstones begin as small crystals and slowly grow into stones. Gallstone pain occurs when the gallbladder spasms and a gallstone is blocking the duct. Pain can also occur when a stone passes out of the duct.  RISK FACTORS  Being female.   Having multiple pregnancies. Health care providers sometimes advise removing diseased gallbladders before future pregnancies.   Being obese.  Eating a diet heavy in fried foods and fat.   Being older than 32 years and increasing age.   Prolonged use of medicines containing female hormones.   Having diabetes mellitus.   Rapidly losing weight.   Having a family history of gallstones (heredity).  SYMPTOMS  Nausea.   Vomiting.  Abdominal pain.   Yellowing of the skin (jaundice).   Sudden pain. It may persist from several minutes to several hours.  Fever.   Tenderness to the touch. In some cases, when gallstones do not move into the bile duct, people have  no pain or symptoms. These are called "silent" gallstones.  TREATMENT Silent gallstones do not need treatment. In severe cases, emergency surgery may be required. Options for treatment include:  Surgery to remove the gallbladder. This is the most common treatment.  Medicines. These do not always work and may take 6 12 months or more to work.  Shock wave treatment (extracorporeal biliary lithotripsy). In this treatment an ultrasound machine sends shock waves to the gallbladder to break gallstones into smaller pieces that can pass into the intestines or be dissolved by medicine. HOME CARE INSTRUCTIONS   Only take over-the-counter or prescription medicines for pain, discomfort, or fever as directed by your health care provider.   Follow a low-fat diet until seen again by your health care provider. Fat causes the gallbladder to contract, which can result in pain.   Follow up with your health care provider as directed. Attacks are almost always recurrent and surgery is usually required for permanent treatment.  SEEK IMMEDIATE MEDICAL CARE IF:   Your pain increases and is not controlled by medicines.   You have a fever or persistent symptoms for more than 2 3 days.   You have a fever and your  symptoms suddenly get worse.   You have persistent nausea and vomiting.  MAKE SURE YOU:   Understand these instructions.  Will watch your condition.  Will get help right away if you are not doing well or get worse. Document Released: 06/21/2005 Document Revised: 02/25/2013 Document Reviewed: 12/17/2012 [facility] Patient Information 2014 [place].  Hyperglycemia Hyperglycemia occurs when the glucose (sugar) in your blood is too high. Hyperglycemia can happen for many reasons, but it most often happens to people who do not know they have diabetes or are not managing their diabetes properly.  CAUSES  Whether you have diabetes or not, there are other causes of hyperglycemia.  Hyperglycemia can occur when you have diabetes, but it can also occur in other situations that you might not be as aware of, such as: Diabetes  If you have diabetes and are having problems controlling your blood glucose, hyperglycemia could occur because of some of the following reasons:  Not following your meal plan.  Not taking your diabetes medications or not taking it properly.  Exercising less or doing less activity than you normally do.  Being sick. Pre-diabetes  This cannot be ignored. Before people develop Type 2 diabetes, they almost always have "pre-diabetes." This is when your blood glucose levels are higher than normal, but not yet high enough to be diagnosed as diabetes. Research has shown that some long-term damage to the body, especially the heart and circulatory system, may already be occurring during pre-diabetes. If you take action to manage your blood glucose when you have pre-diabetes, you may delay or prevent Type 2 diabetes from developing. Stress  If you have diabetes, you may be "diet" controlled or on oral medications or insulin to control your diabetes. However, you may find that your blood glucose is higher than usual in the hospital whether you have diabetes or not. This is often referred to as "stress hyperglycemia." Stress can elevate your blood glucose. This happens because of hormones put out by the body during times of stress. If stress has been the cause of your high blood glucose, it can be followed regularly by your caregiver. That way he/she can make sure your hyperglycemia does not continue to get worse or progress to diabetes. Steroids  Steroids are medications that act on the infection fighting system (immune system) to block inflammation or infection. One side effect can be a rise in blood glucose. Most people can produce enough extra insulin to allow for this rise, but for those who cannot, steroids make blood glucose levels go even higher. It is not  unusual for steroid treatments to "uncover" diabetes that is developing. It is not always possible to determine if the hyperglycemia will go away after the steroids are stopped. A special blood test called an A1c is sometimes done to determine if your blood glucose was elevated before the steroids were started. SYMPTOMS  Thirsty.  Frequent urination.  Dry mouth.  Blurred vision.  Tired or fatigue.  Weakness.  Sleepy.  Tingling in feet or leg. DIAGNOSIS  Diagnosis is made by monitoring blood glucose in one or all of the following ways:  A1c test. This is a chemical found in your blood.  Fingerstick blood glucose monitoring.  Laboratory results. TREATMENT  First, knowing the cause of the hyperglycemia is important before the hyperglycemia can be treated. Treatment may include, but is not be limited to:  Education.  Change or adjustment in medications.  Change or adjustment in meal plan.  Treatment for an illness,  infection, etc.  More frequent blood glucose monitoring.  Change in exercise plan.  Decreasing or stopping steroids.  Lifestyle changes. HOME CARE INSTRUCTIONS   Test your blood glucose as directed.  Exercise regularly. Your caregiver will give you instructions about exercise. Pre-diabetes or diabetes which comes on with stress is helped by exercising.  Eat wholesome, balanced meals. Eat often and at regular, fixed times. Your caregiver or nutritionist will give you a meal plan to guide your sugar intake.  Being at an ideal weight is important. If needed, losing as little as 10 to 15 pounds may help improve blood glucose levels. SEEK MEDICAL CARE IF:   You have questions about medicine, activity, or diet.  You continue to have symptoms (problems such as increased thirst, urination, or weight gain). SEEK IMMEDIATE MEDICAL CARE IF:   You are vomiting or have diarrhea.  Your breath smells fruity.  You are breathing faster or slower.  You are very  sleepy or incoherent.  You have numbness, tingling, or pain in your feet or hands.  You have chest pain.  Your symptoms get worse even though you have been following your caregiver's orders.  If you have any other questions or concerns. Document Released: 12/19/2000 Document Revised: 09/17/2011 Document Reviewed: 10/22/2011 [facility] Patient Information 2014 [place], [place].

## 2013-10-28 NOTE — ED Notes (Signed)
Pt seems happy content and stable at this time.

## 2013-10-29 LAB — URINE CULTURE

## 2013-10-29 NOTE — ED Provider Notes (Signed)
Medical screening examination/treatment/procedure(s) were performed by non-physician practitioner and as supervising physician I was immediately available for consultation/collaboration.   EKG Interpretation None        Saddie Benders. Dorna Mai, MD 10/29/13 [phone]

## 2013-10-30 ENCOUNTER — Telehealth (HOSPITAL_BASED_OUTPATIENT_CLINIC_OR_DEPARTMENT_OTHER): Payer: Self-pay | Admitting: Emergency Medicine

## 2013-10-30 NOTE — Telephone Encounter (Signed)
Post ED Visit - Positive Culture Follow-up: Successful Patient Follow-Up  Culture assessed and recommendations reviewed by: []  Wes Anthony, Pharm.D., BCPS []  Heide Guile, Pharm.D., BCPS []  Alycia Rossetti, Pharm.D., BCPS []  [place], Pharm.D., BCPS, AAHIVP []  Legrand Como, Pharm.D., BCPS, AAHIVP [x]  Juliene Pina, Pharm.D.  Positive urine culture  [x]  Patient discharged without antimicrobial prescription and treatment is now indicated []  Organism is resistant to prescribed ED discharge antimicrobial []  Patient with positive blood cultures  Changes discussed with ED provider: Margarita Mail PA-C New antibiotic prescription: Bactrim DS 1 BID x 3 days    [facility] 10/30/2013, 10:39 AM

## 2013-10-30 NOTE — Progress Notes (Signed)
ED Antimicrobial Stewardship Positive Culture Follow Up   Melanie Hall is an 43 y.o. female who presented to [facility] on 10/27/2013 with a chief complaint of  Chief Complaint  Patient presents with  . Abdominal Pain  . Back Pain    Recent Results (from the past 720 hour(s))  URINE CULTURE     Status: None   Collection Time    10/27/13  8:22 PM      Result Value Ref Range Status   Specimen Description URINE, RANDOM   Final   Special Requests ADDED [id number]   Final   Culture  Setup Time     Final   Value: 10/28/2013 05:07     Performed at [place]     Final   Value: >=100,000 COLONIES/ML     Performed at [facility]   Culture     Final   Value: ESCHERICHIA COLI     Performed at [facility]   Report Status 10/29/2013 FINAL   Final   Organism ID, Bacteria ESCHERICHIA COLI   Final    [x]  Patient discharged originally without antimicrobial agent and treatment is now indicated  New antibiotic prescription: Bactrim DS, take 1 tablet twice daily x3d  ED Provider: Margarita Mail, PA-C  Renelda Mom Jacqlyn Larsen, PharmD Clinical Pharmacist - Resident Pager: [phone] Pharmacy: [phone] 10/30/2013 10:41 AM

## 2013-11-04 ENCOUNTER — Telehealth (HOSPITAL_BASED_OUTPATIENT_CLINIC_OR_DEPARTMENT_OTHER): Payer: Self-pay

## 2013-11-04 NOTE — Telephone Encounter (Signed)
Left message for pt to return call.

## 2013-11-09 NOTE — Telephone Encounter (Signed)
Unable to contact patient via phone. Sent letter. °

## 2013-12-31 ENCOUNTER — Telehealth (HOSPITAL_BASED_OUTPATIENT_CLINIC_OR_DEPARTMENT_OTHER): Payer: Self-pay | Admitting: Emergency Medicine

## 2014-09-02 ENCOUNTER — Emergency Department (HOSPITAL_COMMUNITY)
Admission: EM | Admit: 2014-09-02 | Discharge: 2014-09-02 | Disposition: A | Discharge status: Home / Self Care | Payer: Self-pay | Attending: Emergency Medicine | Admitting: Emergency Medicine

## 2014-09-02 ENCOUNTER — Encounter (HOSPITAL_COMMUNITY): Payer: Self-pay | Admitting: *Deleted

## 2014-09-02 DIAGNOSIS — L03311 Cellulitis of abdominal wall: Secondary | ICD-10-CM | POA: Insufficient documentation

## 2014-09-02 DIAGNOSIS — L0390 Cellulitis, unspecified: Secondary | ICD-10-CM

## 2014-09-02 DIAGNOSIS — L0291 Cutaneous abscess, unspecified: Secondary | ICD-10-CM

## 2014-09-02 DIAGNOSIS — L02211 Cutaneous abscess of abdominal wall: Secondary | ICD-10-CM | POA: Insufficient documentation

## 2014-09-02 DIAGNOSIS — E119 Type 2 diabetes mellitus without complications: Secondary | ICD-10-CM | POA: Insufficient documentation

## 2014-09-02 MED ORDER — HYDROCODONE-ACETAMINOPHEN 5-325 MG PO TABS: 1.0000 | ORAL_TABLET | ORAL | Status: DC | PRN

## 2014-09-02 MED ORDER — CLINDAMYCIN HCL 150 MG PO CAPS: 300.0000 mg | ORAL_CAPSULE | Freq: Three times a day (TID) | ORAL | Status: DC

## 2014-09-02 NOTE — ED Notes (Signed)
Pt reports boil started 3 days ago. Large red colored skin noted on RLQ of ABD.

## 2014-09-02 NOTE — Discharge Instructions (Signed)
Take the prescribed medication as directed.  Area will continue to drain which is normal.  May also wish to do warm compresses at home. Follow-up with urgent care in 2 days for wound check. Return to the ED for new or worsening symptoms.

## 2014-09-02 NOTE — ED Provider Notes (Signed)
CSN: [id number]     Arrival date & time 09/02/14  0932 History  This chart was scribed for [name], PA-C working with Nat Christen, MD by Mercy Moore, ED Scribe. This patient was seen in room TR11C/TR11C and the patient's care was started at 9:55 AM.   Chief Complaint  Patient presents with  . Recurrent Skin Infections   The history is provided by the patient. No language interpreter was used.   HPI Comments: Melanie Hall is a 44 y.o. female who presents to the Emergency Department complaining of abscess to RLQ abdominal wall that has worsened over the past three days. Patient reports that the abscess initiated as a "small bump" that has increasingly become more swollen, painful, and discolored. Patient reports treatment with a potato last night, recommended by her roommate to "get it to come to a head." Patient states that the potato treatment was effective and reports pus like drainage this morning.  Patient denies history of MRSA. Patient reports history of Diabetes Mellitus that she manages with her diet. No fever, chills, abdominal pain, nausea, vomiting, diarrhea.  VSS on arrival.  Past Medical History  Diagnosis Date  . Diabetes mellitus    Past Surgical History  Procedure Laterality Date  . Tubal ligation    . Knee surgery     History reviewed. No pertinent family history. History  Substance Use Topics  . Smoking status: Never Smoker   . Smokeless tobacco: Not on file  . Alcohol Use: No   OB History    No data available     Review of Systems  Constitutional: Negative for fever and chills.  Skin: Positive for color change and wound.       Abscess   All other systems reviewed and are negative.   Allergies  Review of patient's allergies indicates no known allergies.  Home Medications   Prior to Admission medications   Medication Sig Start Date End Date Taking? Authorizing Provider  oxyCODONE-acetaminophen (PERCOCET/ROXICET) 5-325 MG per tablet Take 1-2 tablets  by mouth every 6 (six) hours as needed for severe pain. 10/28/13   Tiffany Marilu Favre, PA-C  promethazine (PHENERGAN) 25 MG tablet Take 1 tablet (25 mg total) by mouth every 6 (six) hours as needed for nausea or vomiting. 10/28/13   Linus Mako, PA-C   Triage Vitals: BP 115/78 mmHg  Pulse 78  Temp(Src) 97.8 F (36.6 C) (Oral)  Resp 20  SpO2 98%  LMP 08/26/2014 Physical Exam  Constitutional: She is oriented to person, place, and time. She appears well-developed and well-nourished. No distress.  HENT:  Head: Normocephalic and atraumatic.  Eyes: EOM are normal.  Neck: Neck supple. No tracheal deviation present.  Cardiovascular: Normal rate.   Pulmonary/Chest: Effort normal. No respiratory distress.  Abdominal:  Small abscess of RLQ abdominal wall with approx 2cm surrounding cellulitis; abscess is open and freely draining purulent material; locally TTP without deformity of abdominal wall noted  Musculoskeletal: Normal range of motion.  Neurological: She is alert and oriented to person, place, and time.  Skin: Skin is warm and dry.  Psychiatric: She has a normal mood and affect. Her behavior is normal.  Nursing note and vitals reviewed.   ED Course  Procedures (including critical care time)  COORDINATION OF CARE: 9:59 AM- Will discharge with antibiotics. Discussed treatment plan with patient at bedside and patient agreed to plan.   Labs Review Labs Reviewed - No data to display  Imaging Review No results found.  EKG Interpretation None      MDM   Final diagnoses:  Abscess and cellulitis   44 year old female with small abscess of right lower quadrant abdominal wall. On exam, patient afebrile and nontoxic in appearance. Abscess is freely draining area looked material with a proximally 2 cm surrounding cellulitis. There is locally tender to palpation without noted deformity of abdominal wall. Given the abscess is openly draining, do not feel that further I&D needed at this  time. Patient will be started on clindamycin and Norco. She is to follow-up with urgent care for wound check in 2 days.  Discussed plan with patient, he/she acknowledged understanding and agreed with plan of care.  Return precautions given for new or worsening symptoms.  I personally performed the services described in this documentation, which was scribed in my presence. The recorded information has been reviewed and is accurate.  Larene Pickett, PA-C 09/02/14 [place], MD 09/02/14 1328

## 2016-02-10 ENCOUNTER — Encounter (HOSPITAL_COMMUNITY): Payer: Self-pay

## 2016-02-10 DIAGNOSIS — M79644 Pain in right finger(s): Secondary | ICD-10-CM | POA: Insufficient documentation

## 2016-02-10 DIAGNOSIS — E119 Type 2 diabetes mellitus without complications: Secondary | ICD-10-CM | POA: Insufficient documentation

## 2016-02-10 DIAGNOSIS — Z7984 Long term (current) use of oral hypoglycemic drugs: Secondary | ICD-10-CM | POA: Insufficient documentation

## 2016-02-10 NOTE — ED Triage Notes (Signed)
Right middle finger pain and swelling X2 days. No injury per patient

## 2016-02-11 ENCOUNTER — Emergency Department (HOSPITAL_COMMUNITY)
Admission: EM | Admit: 2016-02-11 | Discharge: 2016-02-11 | Disposition: A | Discharge status: Home / Self Care | Payer: Self-pay | Attending: Emergency Medicine | Admitting: Emergency Medicine

## 2016-02-11 ENCOUNTER — Emergency Department (HOSPITAL_COMMUNITY): Payer: Self-pay

## 2016-02-11 DIAGNOSIS — M79644 Pain in right finger(s): Secondary | ICD-10-CM

## 2016-02-11 IMAGING — DX DG FINGER MIDDLE 2+V*R*
3 series · 3 of 3 positions shown · non-contrast
Comparison: None.

CLINICAL DATA: Middle finger PIP swelling for 2 days, no injury.

EXAM:
RIGHT MIDDLE FINGER 2+V

[finger ap]
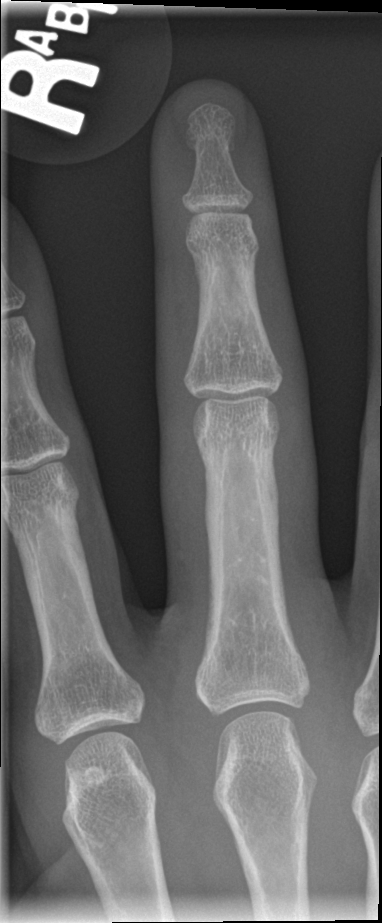

[finger obl]
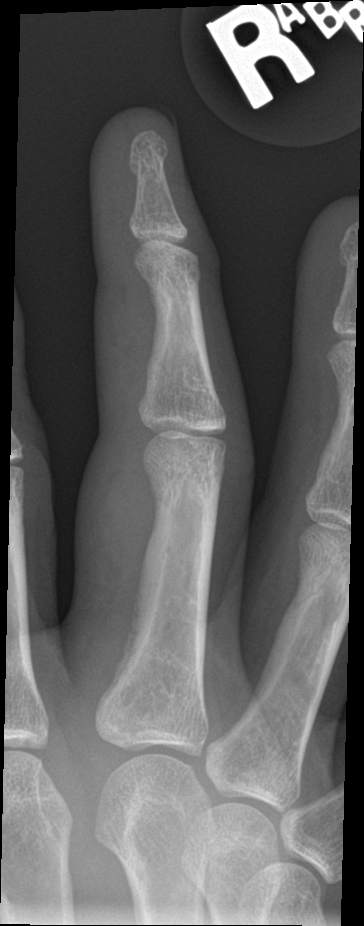

[finger lat]
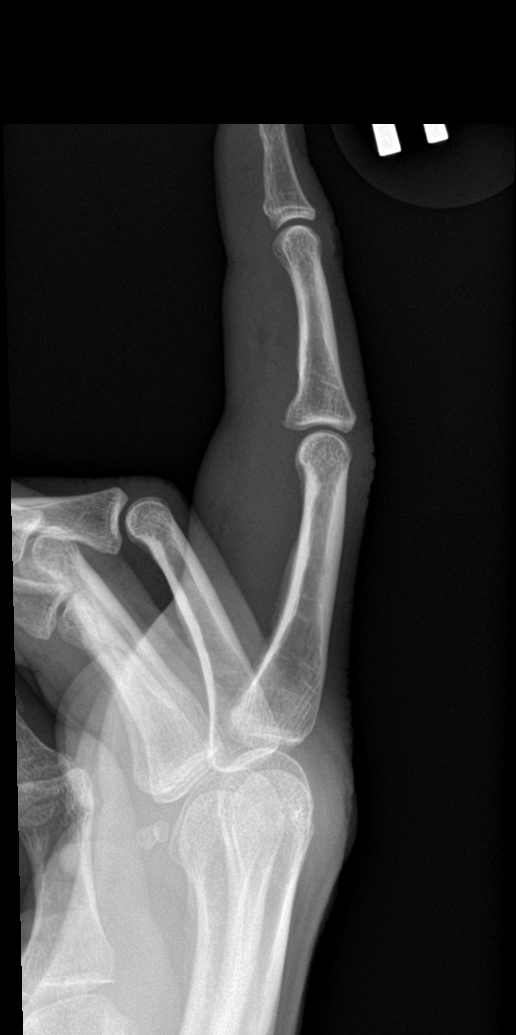

[3 of 3 positions shown; findings below may reference images not displayed]

FINDINGS: There is no evidence of fracture or dislocation. There is no
evidence of arthropathy or other focal bone abnormality. Plantar
third digit soft tissue swelling without subcutaneous gas or
radiopaque foreign bodies.
IMPRESSION: Soft tissue swelling without acute osseous process.

## 2016-02-11 MED ORDER — DICLOFENAC SODIUM 50 MG PO TBEC: 50.0000 mg | DELAYED_RELEASE_TABLET | Freq: Two times a day (BID) | ORAL | 0 refills | Status: DC

## 2016-02-11 NOTE — ED Provider Notes (Signed)
[facility] DEPT Provider Note   CSN: [id number] Arrival date & time: 02/10/16  2220  First Provider Contact:  First MD Initiated Contact with Patient 02/11/16 0004        History   Chief Complaint Chief Complaint  Patient presents with  . Finger Injury    HPI Melanie Hall is a 45 y.o. female who presents to the ED with right middle finger swelling and pain that started 3 days ago. She does not remember any injury to the finger. Patient states that she often has pain in the left ring finger where it was broken in the past and and she did not follow up.   The history is provided by the patient. No language interpreter was used.    Past Medical History:  Diagnosis Date  . Diabetes mellitus     Patient Active Problem List   Diagnosis Date Noted  . IRREGULAR MENSES 09/14/2009  . BURN, FIRST DEGREE, ARM 09/14/2009  . HYPERCHOLESTEROLEMIA 11/19/2008  . PARONYCHIA, RIGHT GREAT TOE 07/30/2008  . ABSCESS, TOOTH 03/31/2008  . DIABETES MELLITUS, TYPE II 07/02/2007  . CATARACTS, BILATERAL 07/02/2007    Past Surgical History:  Procedure Laterality Date  . KNEE SURGERY    . TUBAL LIGATION      OB History    No data available       Home Medications    Prior to Admission medications   Medication Sig Start Date End Date Taking? Authorizing Provider  clindamycin (CLEOCIN) 150 MG capsule Take 2 capsules (300 mg total) by mouth 3 (three) times daily. May dispense as 150mg  capsules 09/02/14   Larene Pickett, PA-C  diclofenac (VOLTAREN) 50 MG EC tablet Take 1 tablet (50 mg total) by mouth 2 (two) times daily. 02/11/16   Hope Bunnie Pion, NP  HYDROcodone-acetaminophen (NORCO/VICODIN) 5-325 MG per tablet Take 1 tablet by mouth every 4 (four) hours as needed. 09/02/14   Larene Pickett, PA-C  oxyCODONE-acetaminophen (PERCOCET/ROXICET) 5-325 MG per tablet Take 1-2 tablets by mouth every 6 (six) hours as needed for severe pain. 10/28/13   Tiffany Carlota Raspberry, PA-C  promethazine (PHENERGAN) 25 MG  tablet Take 1 tablet (25 mg total) by mouth every 6 (six) hours as needed for nausea or vomiting. 10/28/13   Delos Haring, PA-C    Family History No family history on file.  Social History Social History  Substance Use Topics  . Smoking status: Never Smoker  . Smokeless tobacco: Never Used  . Alcohol use No     Allergies   Review of patient's allergies indicates no known allergies.   Review of Systems Review of Systems  Musculoskeletal: Positive for arthralgias.       Right middle finger pain  all other systems negative   Physical Exam Updated Vital Signs BP 137/97 (BP Location: Left Arm)   Pulse 86   Temp 98.2 F (36.8 C) (Oral)   Resp 18   Ht 5' 2.5" (1.588 m)   Wt 68 kg   LMP 01/27/2016   SpO2 100%   BMI 27.00 kg/m   Physical Exam  Constitutional: She is oriented to person, place, and time. She appears well-developed and well-nourished. No distress.  Eyes: EOM are normal.  Neck: Neck supple.  Cardiovascular: Normal rate.   Pulmonary/Chest: Effort normal.  Musculoskeletal:       Hands: Right middle finger has swelling and tenderness at the PIP. Radial pulse 2+, adequate circulation. No red streaking, no increased warmth.   Neurological: She is alert  and oriented to person, place, and time. No cranial nerve deficit.  Skin: Skin is warm and dry.  Psychiatric: She has a normal mood and affect. Her behavior is normal.  Nursing note and vitals reviewed.    ED Treatments / Results  Labs (all labs ordered are listed, but only abnormal results are displayed) Labs Reviewed - No data to display  EKG  Radiology Dg Finger Middle Right  Result Date: 02/11/2016 CLINICAL DATA:  Middle finger PIP swelling for 2 days, no injury. EXAM: RIGHT MIDDLE FINGER 2+V COMPARISON:  None. FINDINGS: There is no evidence of fracture or dislocation. There is no evidence of arthropathy or other focal bone abnormality. Plantar third digit soft tissue swelling without subcutaneous  gas or radiopaque foreign bodies. IMPRESSION: Soft tissue swelling without acute osseous process. Electronically Signed   By: Elon Alas M.D.   On: 02/11/2016 00:32    Procedures Procedures (including critical care time) Splint  Medications Ordered in ED Medications - No data to display   Initial Impression / Assessment and Plan / ED Course  I have reviewed the triage vital signs and the nursing notes.  Pertinent imaging results that were available during my care of the patient were reviewed by me and considered in my medical decision making (see chart for details).  Clinical Course  Discussed with the patient clinical and x-ray findings and plan of care. All questioned fully answered. She f/u with ortho or return here if any problems arise.    Final Clinical Impressions(s) / ED Diagnoses  45 y.o. female with swelling and tenderness of the PIP of the right middle finger. Stable for d/c without focal neuro deficits, no fever, red streaking or signs of infection. Splint applied, NSAIDS and f/u with ortho.  Diff dx include gout, arthritis, tendonitis.  Final diagnoses:  Finger pain, right    New Prescriptions New Prescriptions   DICLOFENAC (VOLTAREN) 50 MG EC TABLET    Take 1 tablet (50 mg total) by mouth 2 (two) times daily.     [facility] Bunnie Pion, NP 02/11/16 [place], DO 02/11/16 [phone]

## 2016-02-11 NOTE — ED Provider Notes (Deleted)
Called for patient from the waiting room and no answer.     [facility] Bunnie Pion, [place] 02/11/16 [phone]

## 2016-11-20 ENCOUNTER — Emergency Department (HOSPITAL_COMMUNITY)
Admission: EM | Admit: 2016-11-20 | Discharge: 2016-11-20 | Disposition: A | Discharge status: Home / Self Care | Payer: Self-pay | Attending: Emergency Medicine | Admitting: Emergency Medicine

## 2016-11-20 ENCOUNTER — Encounter (HOSPITAL_COMMUNITY): Payer: Self-pay | Admitting: *Deleted

## 2016-11-20 ENCOUNTER — Emergency Department (HOSPITAL_COMMUNITY): Payer: Self-pay

## 2016-11-20 DIAGNOSIS — E119 Type 2 diabetes mellitus without complications: Secondary | ICD-10-CM | POA: Insufficient documentation

## 2016-11-20 DIAGNOSIS — Z79899 Other long term (current) drug therapy: Secondary | ICD-10-CM | POA: Insufficient documentation

## 2016-11-20 DIAGNOSIS — Y999 Unspecified external cause status: Secondary | ICD-10-CM | POA: Insufficient documentation

## 2016-11-20 DIAGNOSIS — W010XXA Fall on same level from slipping, tripping and stumbling without subsequent striking against object, initial encounter: Secondary | ICD-10-CM | POA: Insufficient documentation

## 2016-11-20 DIAGNOSIS — S93412A Sprain of calcaneofibular ligament of left ankle, initial encounter: Secondary | ICD-10-CM | POA: Insufficient documentation

## 2016-11-20 DIAGNOSIS — S8392XA Sprain of unspecified site of left knee, initial encounter: Secondary | ICD-10-CM | POA: Insufficient documentation

## 2016-11-20 DIAGNOSIS — Y939 Activity, unspecified: Secondary | ICD-10-CM | POA: Insufficient documentation

## 2016-11-20 DIAGNOSIS — Y929 Unspecified place or not applicable: Secondary | ICD-10-CM | POA: Insufficient documentation

## 2016-11-20 IMAGING — DX DG KNEE COMPLETE 4+V*L*
4 series · 4 of 4 positions shown · non-contrast
Comparison: None.

CLINICAL DATA: Pain following fall 1 week prior

EXAM:
LEFT KNEE - COMPLETE 4+ VIEW

[knee ap (1 of 3)]
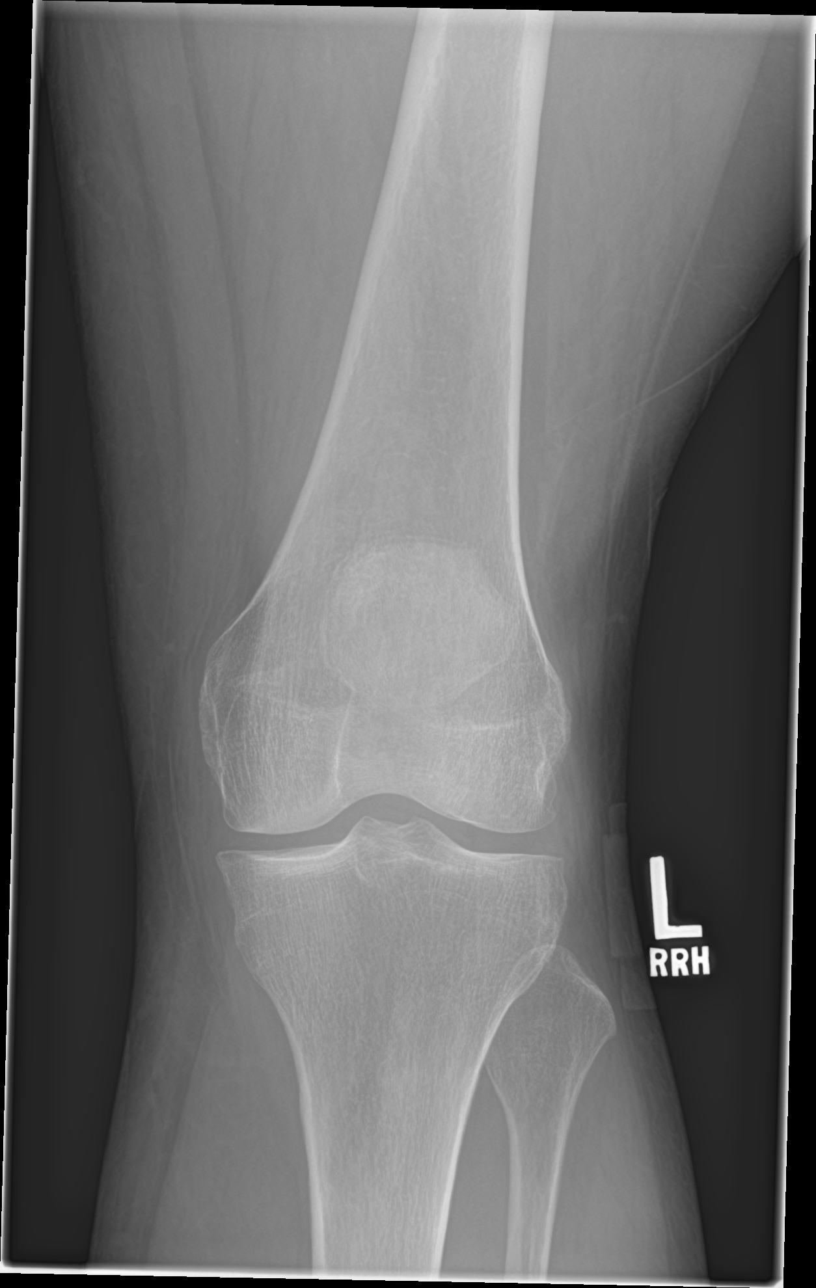

[knee ap (2 of 3)]
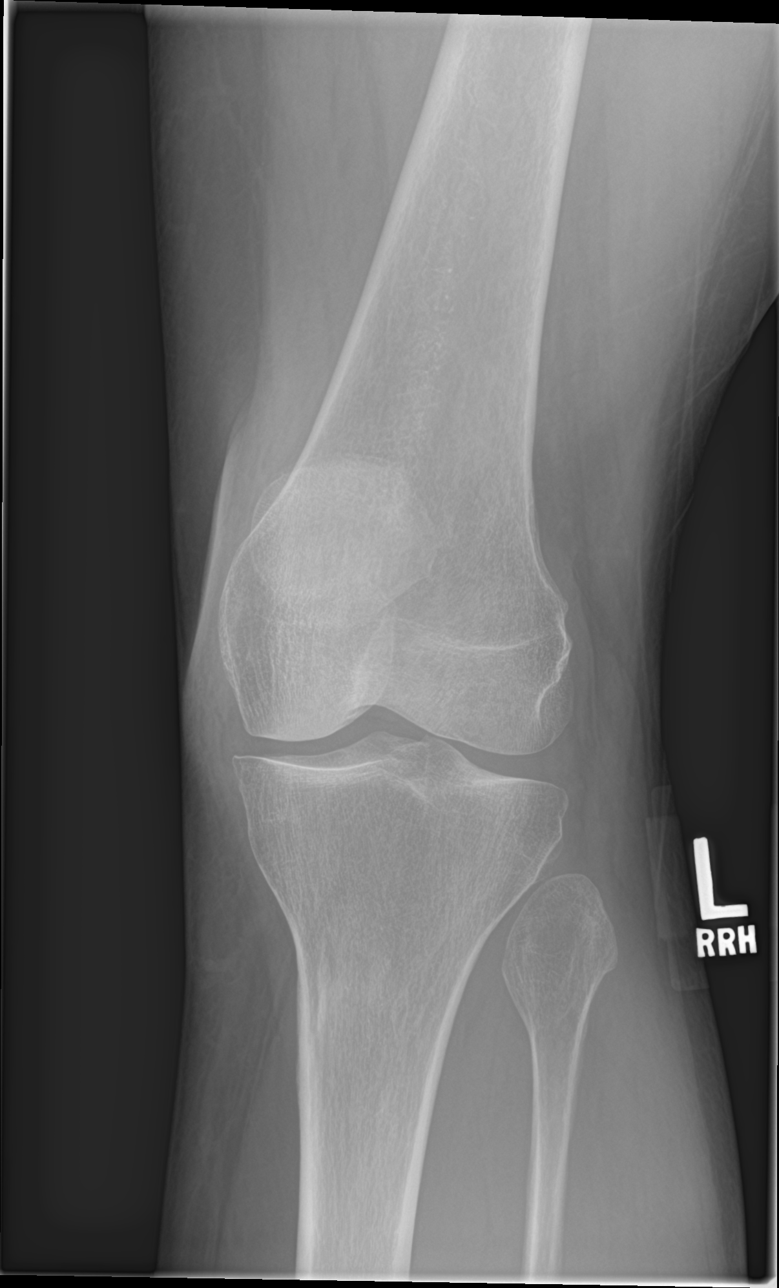

[knee ap (3 of 3)]
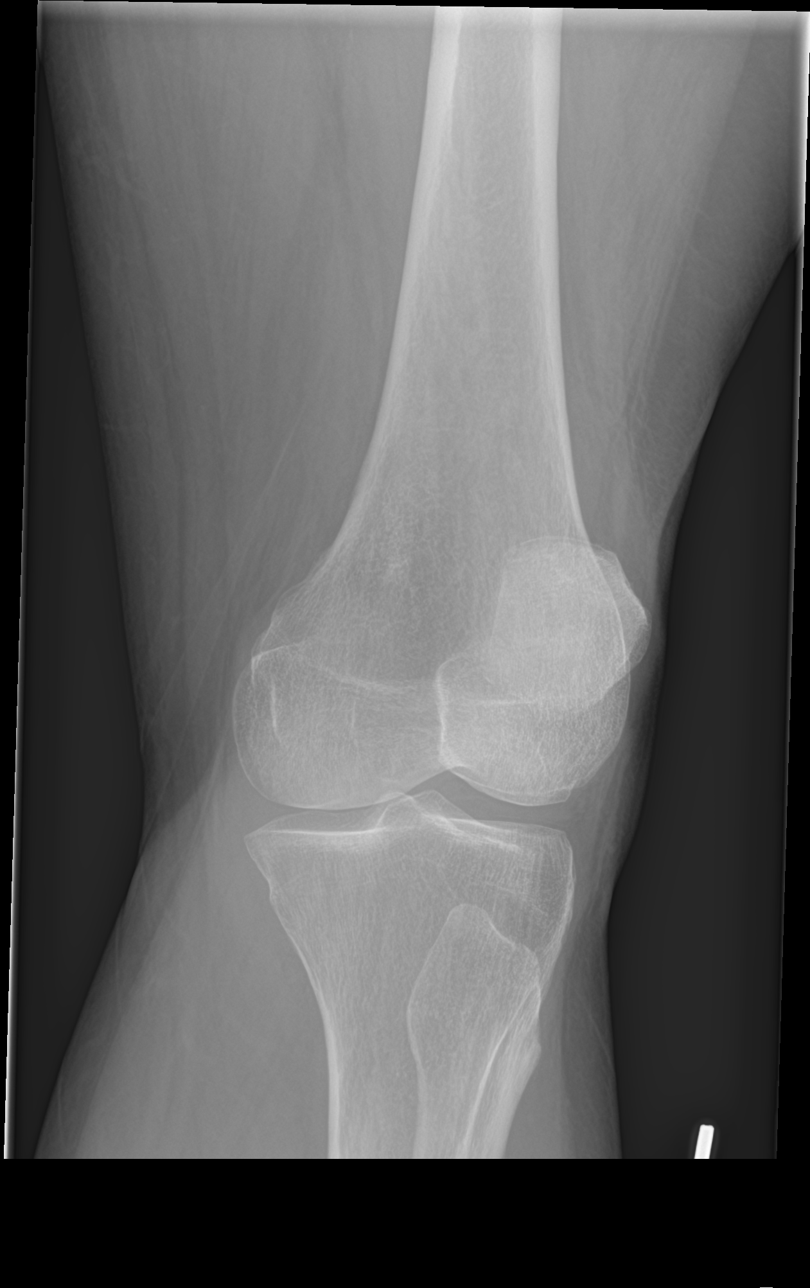

[knee lat]
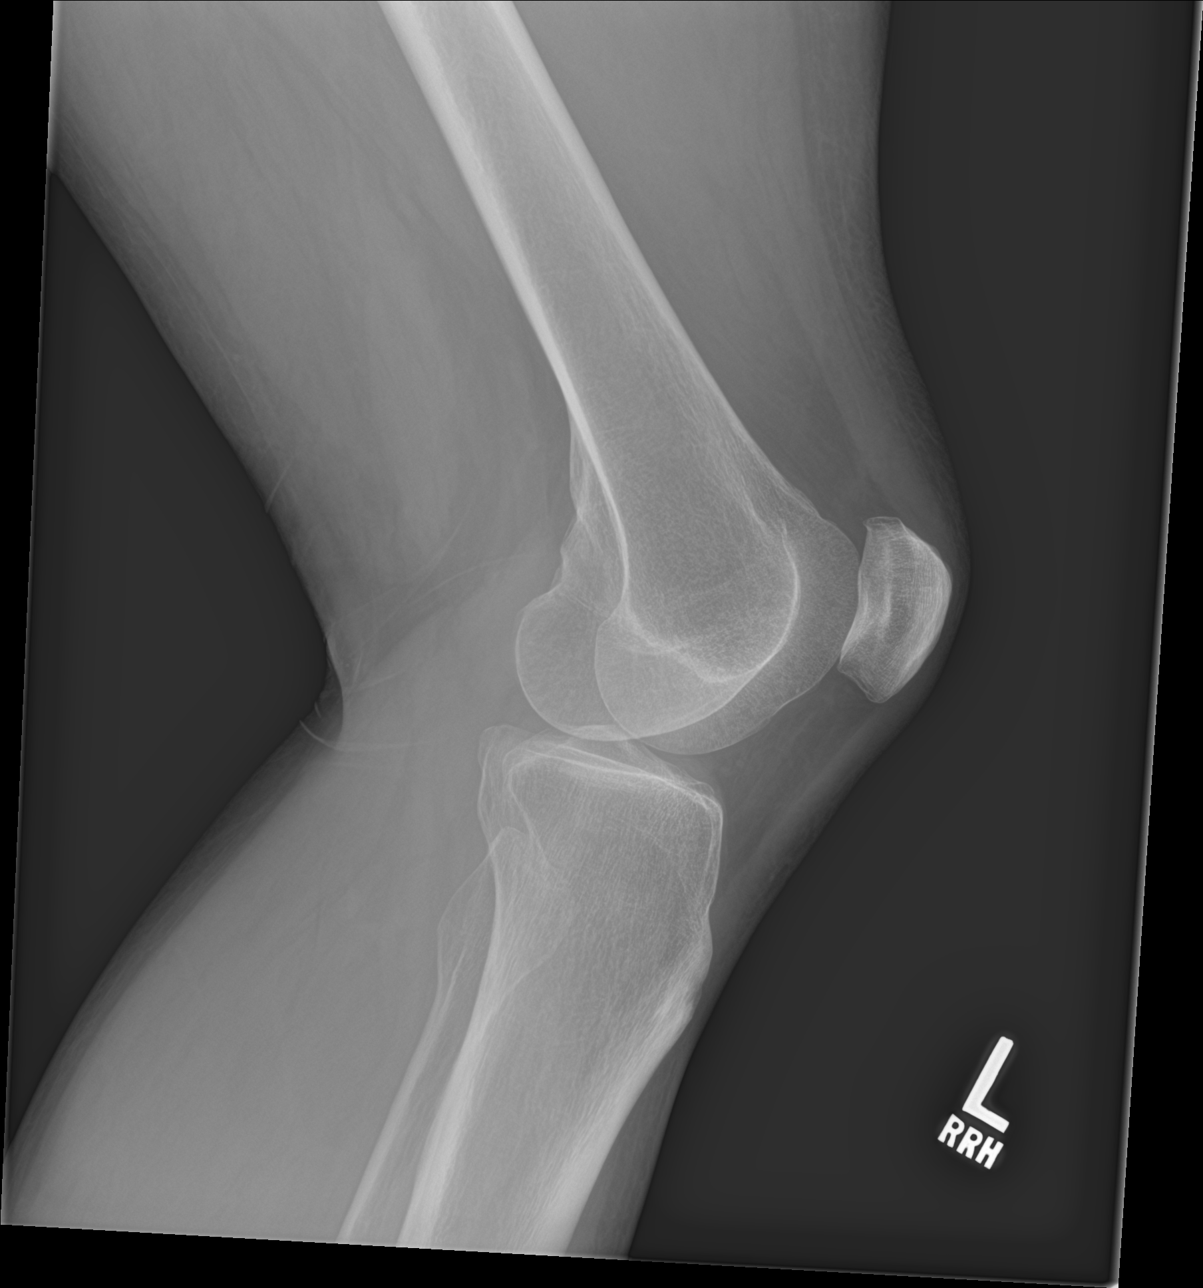

[4 of 4 positions shown; findings below may reference images not displayed]

FINDINGS: Frontal, lateral, and bilateral oblique views were obtained. There
is mild soft tissue swelling. No fracture or dislocation. No joint
effusion. Joint spaces appear unremarkable. No erosive change.
IMPRESSION: Soft tissue swelling. No fracture or joint effusion. No appreciable
arthropathic change.

## 2016-11-20 IMAGING — DX DG ANKLE COMPLETE 3+V*L*
3 series · 3 of 3 positions shown · non-contrast
Comparison: None.

CLINICAL DATA: Fall 1 week ago with left ankle pain. Initial
encounter.

EXAM:
LEFT ANKLE COMPLETE - 3+ VIEW

[ankle ap]
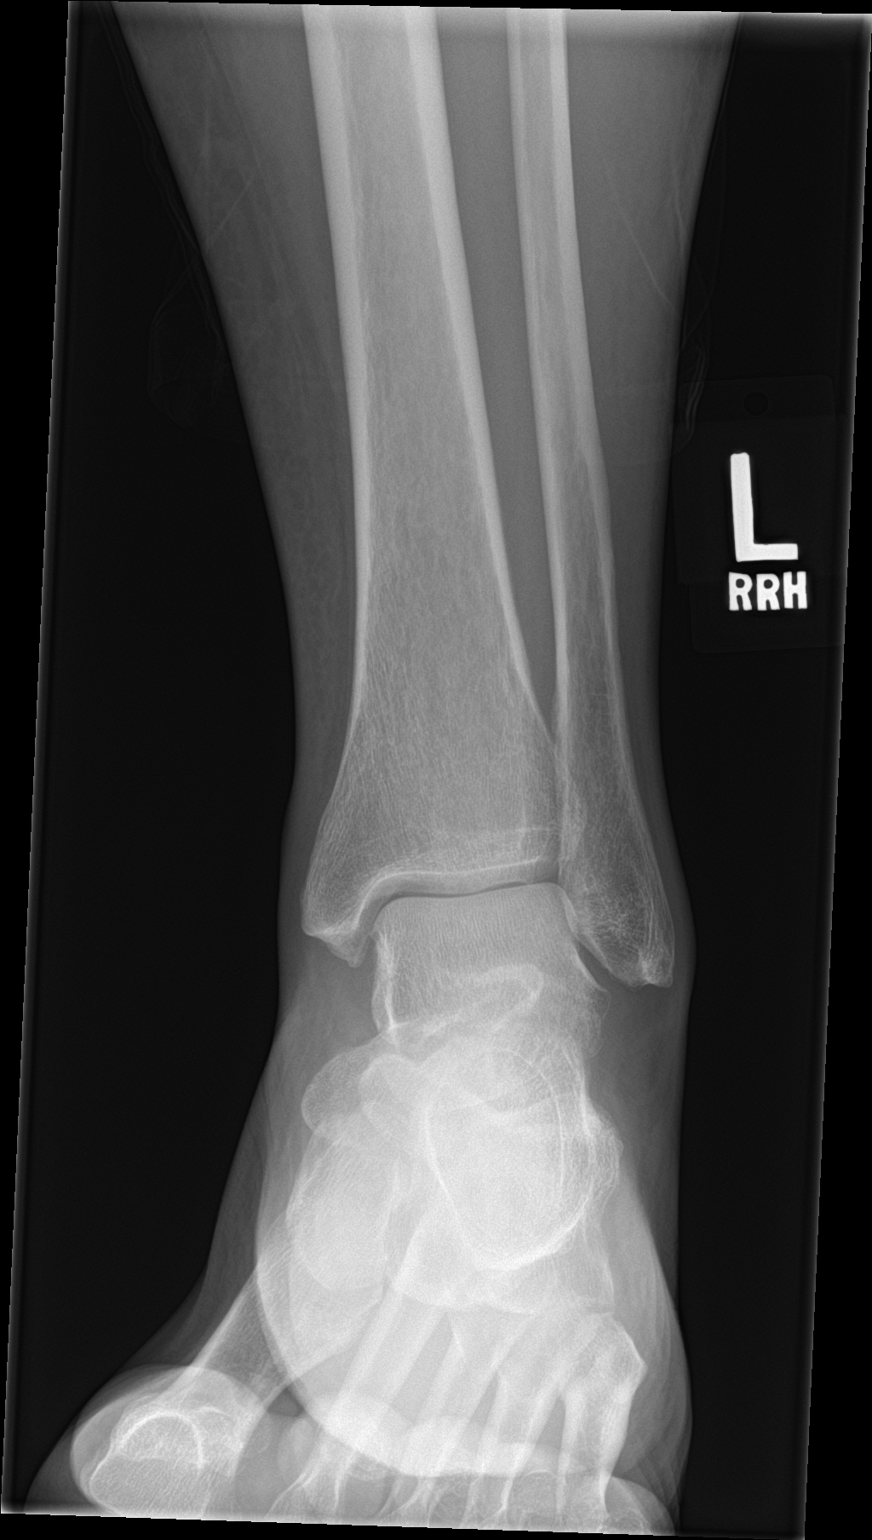

[ankle obl]
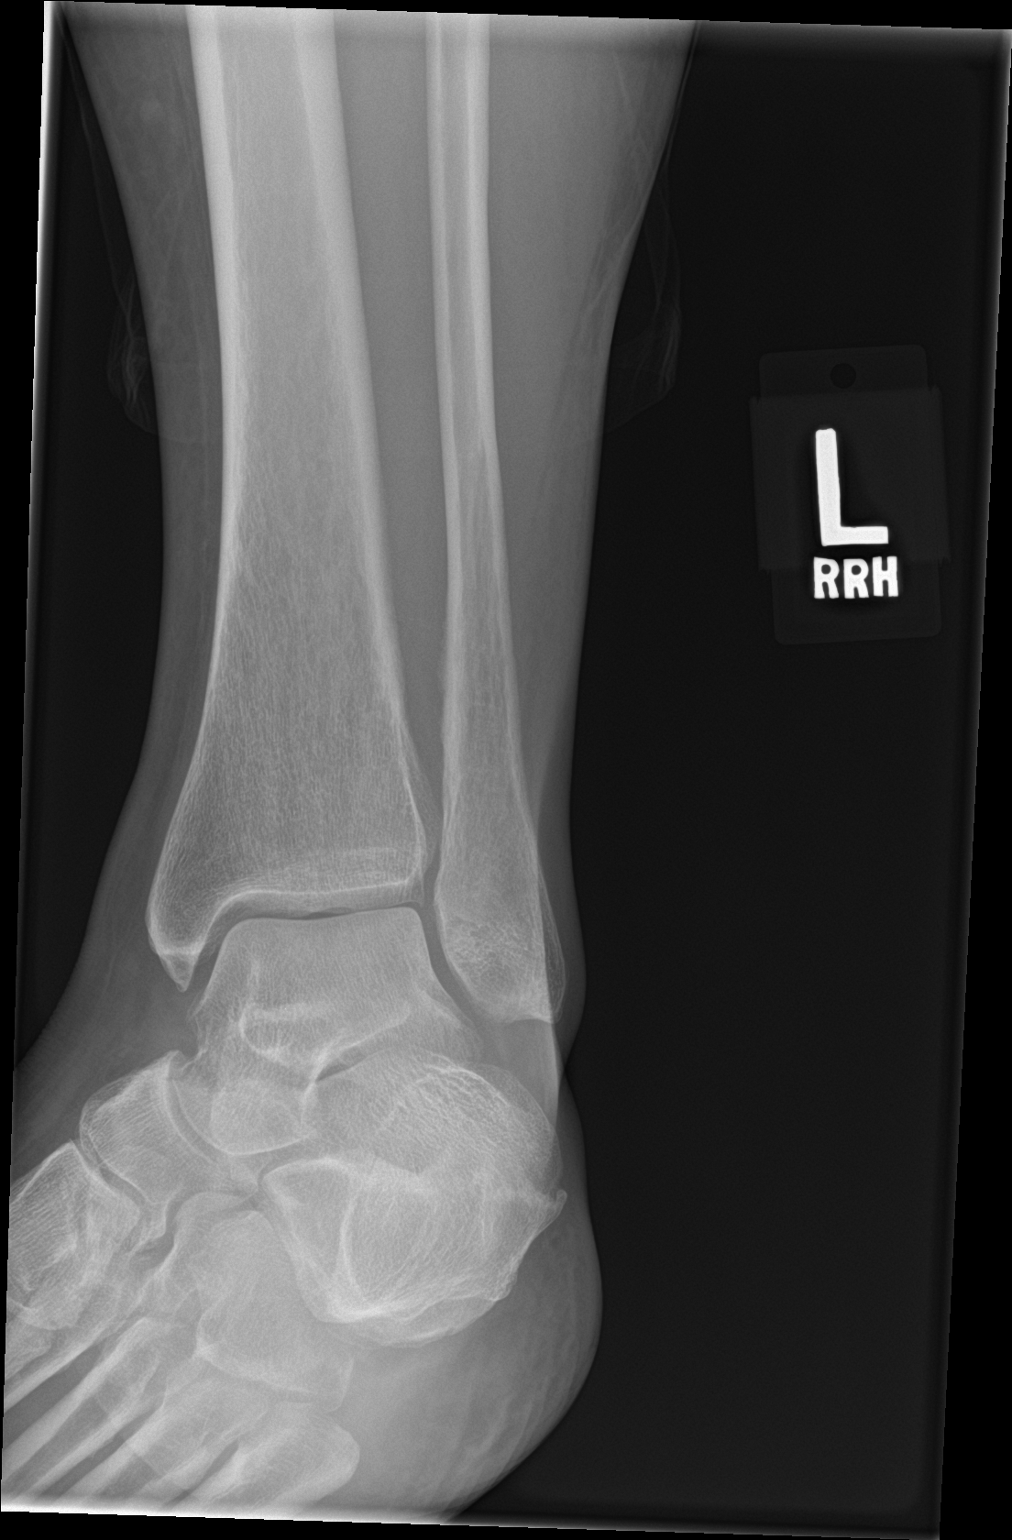

[ankle lat]
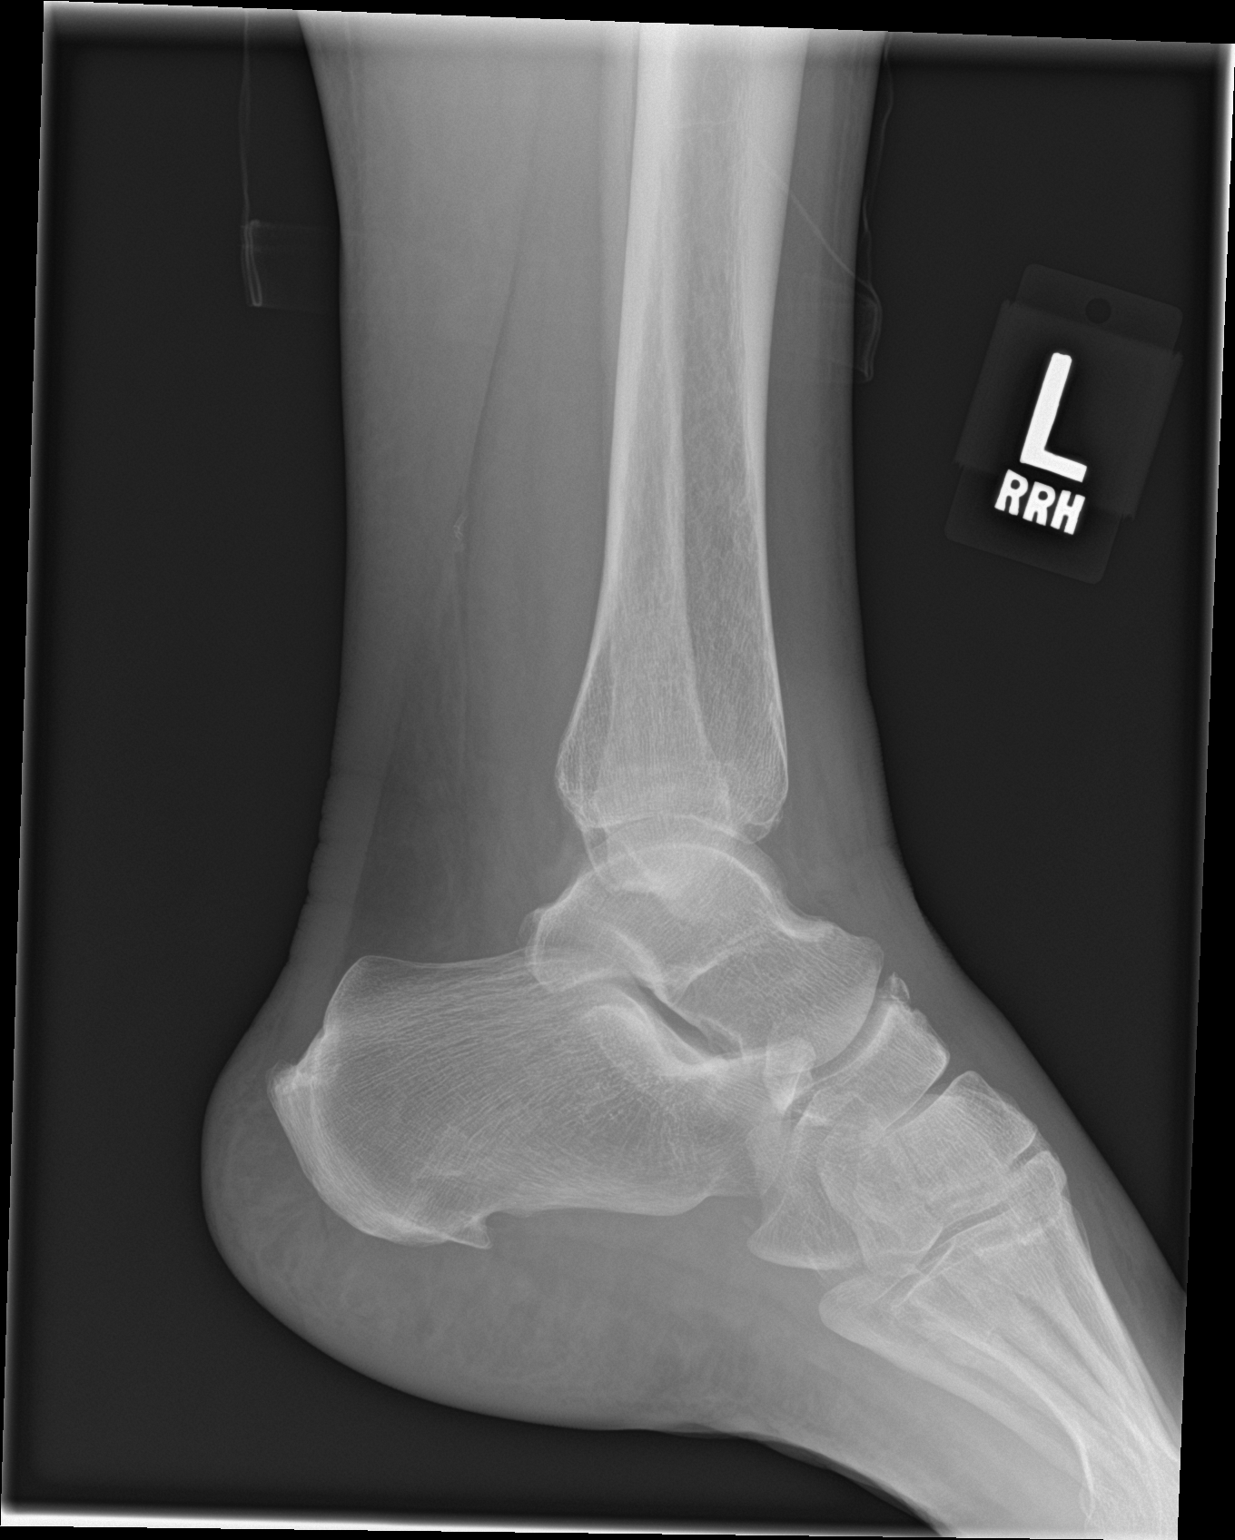

[3 of 3 positions shown; findings below may reference images not displayed]

FINDINGS: There is no evidence of fracture, dislocation, or joint effusion.
There is no evidence of arthropathy or other focal bone abnormality.
Soft tissues are unremarkable.
IMPRESSION: Negative.

## 2016-11-20 MED ORDER — NAPROXEN 500 MG PO TABS: 500.0000 mg | ORAL_TABLET | Freq: Two times a day (BID) | ORAL | 0 refills | Status: DC

## 2016-11-20 NOTE — Discharge Instructions (Signed)
Wear the ASO and knee sleeve.  Use ice and elevation as you are doing for the next several days to help reduce the swelling.  Also add heat therapy for 20 minutes several times daily.    Use the naproxen also for inflammation.  Call the orthopedic doctor listed for a recheck of your injury in 1 week if not improving.  You may benefit from physical therapy of your ankle and knee if it is not getting better over the next week.

## 2016-11-20 NOTE — ED Notes (Signed)
Patient transported to X-ray 

## 2016-11-20 NOTE — ED Notes (Signed)
Pt returned from xray

## 2016-11-22 NOTE — ED Provider Notes (Signed)
[facility] DEPT Provider Note   CSN: [id number] Arrival date & time: 11/20/16  1020     History   Chief Complaint Chief Complaint  Patient presents with  . Fall    knee and ankle    HPI Melanie Hall is a 46 y.o. female presenting for evaluation of persistent pain in her left knee and ankle after tripping and falling one week ago, describing inversion left ankle injury but also with left lateral knee pain since the event.  She was been using rest and ice with the hope that her injuries would improve, but have not so presents for evaluation.  She denies weakness or numbness in her foot or toes.  Her pain is aching and constant, better at rest, worsened with weight bearing.  There is no radiation of pain.  The history is provided by the patient.    Past Medical History:  Diagnosis Date  . Diabetes mellitus     Patient Active Problem List   Diagnosis Date Noted  . IRREGULAR MENSES 09/14/2009  . BURN, FIRST DEGREE, ARM 09/14/2009  . HYPERCHOLESTEROLEMIA 11/19/2008  . PARONYCHIA, RIGHT GREAT TOE 07/30/2008  . ABSCESS, TOOTH 03/31/2008  . DIABETES MELLITUS, TYPE II 07/02/2007  . CATARACTS, BILATERAL 07/02/2007    Past Surgical History:  Procedure Laterality Date  . KNEE SURGERY    . TUBAL LIGATION      OB History    No data available       Home Medications    Prior to Admission medications   Medication Sig Start Date End Date Taking? Authorizing Provider  clindamycin (CLEOCIN) 150 MG capsule Take 2 capsules (300 mg total) by mouth 3 (three) times daily. May dispense as 150mg  capsules 09/02/14   Larene Pickett, PA-C  diclofenac (VOLTAREN) 50 MG EC tablet Take 1 tablet (50 mg total) by mouth 2 (two) times daily. 02/11/16   Ashley Murrain, NP  HYDROcodone-acetaminophen (NORCO/VICODIN) 5-325 MG per tablet Take 1 tablet by mouth every 4 (four) hours as needed. 09/02/14   Larene Pickett, PA-C  naproxen (NAPROSYN) 500 MG tablet Take 1 tablet (500 mg total) by mouth 2  (two) times daily. 11/20/16   Evalee Jefferson, PA-C  oxyCODONE-acetaminophen (PERCOCET/ROXICET) 5-325 MG per tablet Take 1-2 tablets by mouth every 6 (six) hours as needed for severe pain. 10/28/13   Delos Haring, PA-C  promethazine (PHENERGAN) 25 MG tablet Take 1 tablet (25 mg total) by mouth every 6 (six) hours as needed for nausea or vomiting. 10/28/13   Delos Haring, PA-C    Family History No family history on file.  Social History Social History  Substance Use Topics  . Smoking status: Never Smoker  . Smokeless tobacco: Never Used  . Alcohol use No     Allergies   Patient has no known allergies.   Review of Systems Review of Systems  Musculoskeletal: Positive for arthralgias and joint swelling.  Skin: Negative for wound.  Neurological: Negative for weakness and numbness.     Physical Exam Updated Vital Signs BP 135/83   Pulse 94   Temp 98.2 F (36.8 C) (Oral)   Resp 16   Wt 66.7 kg   LMP 11/06/2016   SpO2 97%   BMI 26.46 kg/m   Physical Exam  Constitutional: She appears well-developed and well-nourished.  HENT:  Head: Normocephalic.  Cardiovascular: Normal rate and intact distal pulses.  Exam reveals no decreased pulses.   Pulses:      Dorsalis pedis pulses are 2+  on the right side, and 2+ on the left side.       Posterior tibial pulses are 2+ on the right side, and 2+ on the left side.  Musculoskeletal: She exhibits edema and tenderness.       Left knee: She exhibits no swelling, no effusion, no ecchymosis, no deformity, no LCL laxity, normal patellar mobility and no MCL laxity. Tenderness found. Lateral joint line tenderness noted.       Right ankle: Normal. She exhibits normal pulse.       Left ankle: She exhibits decreased range of motion and swelling. She exhibits no ecchymosis, no deformity, no laceration and normal pulse. Tenderness. Lateral malleolus tenderness found. No head of 5th metatarsal and no proximal fibula tenderness found. Achilles tendon  normal.  Calf is soft and nontender.  Neurological: She is alert. No sensory deficit.  Skin: Skin is warm, dry and intact.  Nursing note and vitals reviewed.    ED Treatments / Results  Labs (all labs ordered are listed, but only abnormal results are displayed) Labs Reviewed - No data to display  EKG  EKG Interpretation None       Radiology No results found.    Dg Ankle Complete Left  Result Date: 11/20/2016 CLINICAL DATA:  Fall 1 week ago with left ankle pain. Initial encounter. EXAM: LEFT ANKLE COMPLETE - 3+ VIEW COMPARISON:  None. FINDINGS: There is no evidence of fracture, dislocation, or joint effusion. There is no evidence of arthropathy or other focal bone abnormality. Soft tissues are unremarkable. IMPRESSION: Negative. Electronically Signed   By: Aletta Edouard M.D.   On: 11/20/2016 11:08   Dg Knee Complete 4 Views Left  Result Date: 11/20/2016 CLINICAL DATA:  Pain following fall 1 week prior EXAM: LEFT KNEE - COMPLETE 4+ VIEW COMPARISON:  None. FINDINGS: Frontal, lateral, and bilateral oblique views were obtained. There is mild soft tissue swelling. No fracture or dislocation. No joint effusion. Joint spaces appear unremarkable. No erosive change. IMPRESSION: Soft tissue swelling. No fracture or joint effusion. No appreciable arthropathic change. Electronically Signed   By: Lowella Grip III M.D.   On: 11/20/2016 11:08     Procedures Procedures (including critical care time)  Medications Ordered in ED Medications - No data to display   Initial Impression / Assessment and Plan / ED Course  I have reviewed the triage vital signs and the nursing notes.  Pertinent labs & imaging results that were available during my care of the patient were reviewed by me and considered in my medical decision making (see chart for details).     RICE, aso, crutches, knee sleeve. Referral to ortho prn if sx persist or worsen.  Final Clinical Impressions(s) / ED Diagnoses    Final diagnoses:  Sprain of left knee, unspecified ligament, initial encounter  Sprain of calcaneofibular ligament of left ankle, initial encounter    New Prescriptions Discharge Medication List as of 11/20/2016 12:51 PM    START taking these medications   Details  naproxen (NAPROSYN) 500 MG tablet Take 1 tablet (500 mg total) by mouth 2 (two) times daily., Starting Tue 11/20/2016, Print         Evalee Jefferson, PA-C 11/22/16 1506    Fredia Sorrow, MD 11/23/16 [phone]

## 2017-01-11 ENCOUNTER — Emergency Department (HOSPITAL_COMMUNITY)
Admission: EM | Admit: 2017-01-11 | Discharge: 2017-01-11 | Disposition: A | Discharge status: Home / Self Care | Payer: Self-pay | Attending: Emergency Medicine | Admitting: Emergency Medicine

## 2017-01-11 ENCOUNTER — Encounter (HOSPITAL_COMMUNITY): Payer: Self-pay | Admitting: Emergency Medicine

## 2017-01-11 DIAGNOSIS — Z791 Long term (current) use of non-steroidal anti-inflammatories (NSAID): Secondary | ICD-10-CM | POA: Insufficient documentation

## 2017-01-11 DIAGNOSIS — H60501 Unspecified acute noninfective otitis externa, right ear: Secondary | ICD-10-CM

## 2017-01-11 DIAGNOSIS — E119 Type 2 diabetes mellitus without complications: Secondary | ICD-10-CM | POA: Insufficient documentation

## 2017-01-11 DIAGNOSIS — H6091 Unspecified otitis externa, right ear: Secondary | ICD-10-CM | POA: Insufficient documentation

## 2017-01-11 MED ORDER — HYDROCORTISONE-ACETIC ACID 1-2 % OT SOLN: 3.0000 [drp] | Freq: Three times a day (TID) | OTIC | 0 refills | Status: AC

## 2017-01-11 NOTE — ED Provider Notes (Signed)
[facility] DEPT Provider Note   CSN: [id number] Arrival date & time: 01/11/17  2044     History   Chief Complaint Chief Complaint  Patient presents with  . Otalgia    HPI Melanie Hall is a 46 y.o. female who presents today with chief complaint acute onset, worsening right ear fullness, decreased hearing, and mild pain for 2 days. She notes pain is throbbing in nature, is "inside the ear" and does not radiate. She denies fevers, chills, vision changes, headaches, lightheadedness, dizziness, or syncope. She denies sore throat, nasal congestion, sinus pain or pressure. She has not tried anything for her symptoms. No aggravating or alleviating factors noted.  The history is provided by the patient.    Past Medical History:  Diagnosis Date  . Diabetes mellitus     Patient Active Problem List   Diagnosis Date Noted  . IRREGULAR MENSES 09/14/2009  . BURN, FIRST DEGREE, ARM 09/14/2009  . HYPERCHOLESTEROLEMIA 11/19/2008  . PARONYCHIA, RIGHT GREAT TOE 07/30/2008  . ABSCESS, TOOTH 03/31/2008  . DIABETES MELLITUS, TYPE II 07/02/2007  . CATARACTS, BILATERAL 07/02/2007    Past Surgical History:  Procedure Laterality Date  . KNEE SURGERY    . TUBAL LIGATION      OB History    No data available       Home Medications    Prior to Admission medications   Medication Sig Start Date End Date Taking? Authorizing Provider  acetic acid-hydrocortisone (VOSOL-HC) OTIC solution Place 3 drops into the right ear 3 (three) times daily. 01/11/17 01/18/17  Rodell Perna A, PA-C  clindamycin (CLEOCIN) 150 MG capsule Take 2 capsules (300 mg total) by mouth 3 (three) times daily. May dispense as 150mg  capsules 09/02/14   Larene Pickett, PA-C  diclofenac (VOLTAREN) 50 MG EC tablet Take 1 tablet (50 mg total) by mouth 2 (two) times daily. 02/11/16   Ashley Murrain, NP  HYDROcodone-acetaminophen (NORCO/VICODIN) 5-325 MG per tablet Take 1 tablet by mouth every 4 (four) hours as needed. 09/02/14   Larene Pickett, PA-C  naproxen (NAPROSYN) 500 MG tablet Take 1 tablet (500 mg total) by mouth 2 (two) times daily. 11/20/16   Evalee Jefferson, PA-C  oxyCODONE-acetaminophen (PERCOCET/ROXICET) 5-325 MG per tablet Take 1-2 tablets by mouth every 6 (six) hours as needed for severe pain. 10/28/13   Delos Haring, PA-C  promethazine (PHENERGAN) 25 MG tablet Take 1 tablet (25 mg total) by mouth every 6 (six) hours as needed for nausea or vomiting. 10/28/13   Delos Haring, PA-C    Family History History reviewed. No pertinent family history.  Social History Social History  Substance Use Topics  . Smoking status: Never Smoker  . Smokeless tobacco: Never Used  . Alcohol use No     Allergies   Patient has no known allergies.   Review of Systems Review of Systems  Constitutional: Negative for chills and fever.  HENT: Positive for ear pain. Negative for sinus pain, sinus pressure, sore throat and tinnitus.        Decreased hearing  Eyes: Negative for visual disturbance.  Respiratory: Negative for cough and shortness of breath.      Physical Exam Updated Vital Signs BP 133/84 (BP Location: Left Arm)   Pulse (!) 109   Temp 98.2 F (36.8 C) (Oral)   Resp 18   Ht 5\' 2"  (1.575 m)   Wt 66.7 kg (147 lb)   LMP 12/17/2016   SpO2 99%   BMI 26.89 kg/m  Physical Exam  Constitutional: She appears well-developed and well-nourished. No distress.  HENT:  Head: Normocephalic and atraumatic.  Right Ear: External ear normal.  Left Ear: External ear normal.  Nose: Nose normal.  Mouth/Throat: Oropharynx is clear and moist.  No TTP of maxillary or frontal sinuses . Left ear unremarkable, no pain on palpation of the external ear, TM without erythema or bulging. Right ear without TTP of the pinna, no erythema, swelling, or tenderness to palpation of the mastoid. There is erythema of and moderate amount brown-red discharge in the right ear canal, pain on insertion of the speculum. TM visualized with normal  bony contours, no erythema or bulging or perforation  Eyes: Conjunctivae are normal. Pupils are equal, round, and reactive to light. Right eye exhibits no discharge. Left eye exhibits no discharge.  Neck: No JVD present. No tracheal deviation present.  Cardiovascular: Normal rate.   Pulmonary/Chest: Effort normal.  Abdominal: She exhibits no distension.  Musculoskeletal: She exhibits no edema.  Neurological: She is alert.  Skin: No erythema.  Psychiatric: She has a normal mood and affect. Her behavior is normal.  Nursing note and vitals reviewed.    ED Treatments / Results  Labs (all labs ordered are listed, but only abnormal results are displayed) Labs Reviewed - No data to display  EKG  EKG Interpretation None       Radiology No results found.  Procedures Procedures (including critical care time)  Medications Ordered in ED Medications - No data to display   Initial Impression / Assessment and Plan / ED Course  I have reviewed the triage vital signs and the nursing notes.  Pertinent labs & imaging results that were available during my care of the patient were reviewed by me and considered in my medical decision making (see chart for details).     Patient with decreased hearing, feeling of fullness, and mild pain to the right ear for 2 days. No canal occlusion, Pt afebrile in NAD. Exam non concerning for mastoiditis, cellulitis or malignant OE. History and physical consistent with mild otitis externa. Discharge with acetic acid and hydrocortisone otic solution. Antibiotics not indicated at this time per Up to Date recommendations. Advised PCP follow up in 2-3 days if no improvement with treatment or no complete resolution by 7 days. Discussed indications for return to the ED sooner. Pt verbalized understanding of and agreement with plan and is safe for discharge home at this time.   Final Clinical Impressions(s) / ED Diagnoses   Final diagnoses:  Acute otitis externa  of right ear, unspecified type    New Prescriptions Discharge Medication List as of 01/11/2017  9:09 PM    START taking these medications   Details  acetic acid-hydrocortisone (VOSOL-HC) OTIC solution Place 3 drops into the right ear 3 (three) times daily., Starting Fri 01/11/2017, Until Fri 01/18/2017, Print         Nils Flack, Keachi A, PA-C 01/11/17 2125    Noemi Chapel, MD 01/12/17 1850

## 2017-01-11 NOTE — Discharge Instructions (Signed)
You have a mild infection of the external ear canal. Apply eardrops 3 times daily for the next 7 days. Follow up with a primary care physician for reevaluation in the next 3-4 days if symptoms do not improve. Return to the ED immediately. Any concerning signs or symptoms such as fever, chills, worsening severe pain, or redness and tenderness to the surrounding area.

## 2017-01-11 NOTE — ED Triage Notes (Signed)
Pt c/o bilateral ear pain x 2 days °

## 2017-09-28 ENCOUNTER — Other Ambulatory Visit: Payer: Self-pay

## 2017-09-28 ENCOUNTER — Encounter (HOSPITAL_COMMUNITY): Payer: Self-pay | Admitting: Emergency Medicine

## 2017-09-28 ENCOUNTER — Emergency Department (HOSPITAL_COMMUNITY)
Admission: EM | Admit: 2017-09-28 | Discharge: 2017-09-28 | Disposition: A | Discharge status: Home / Self Care | Payer: Self-pay | Attending: Emergency Medicine | Admitting: Emergency Medicine

## 2017-09-28 ENCOUNTER — Emergency Department (HOSPITAL_COMMUNITY): Payer: Self-pay

## 2017-09-28 DIAGNOSIS — W010XXA Fall on same level from slipping, tripping and stumbling without subsequent striking against object, initial encounter: Secondary | ICD-10-CM | POA: Insufficient documentation

## 2017-09-28 DIAGNOSIS — Y999 Unspecified external cause status: Secondary | ICD-10-CM | POA: Insufficient documentation

## 2017-09-28 DIAGNOSIS — Y92090 Kitchen in other non-institutional residence as the place of occurrence of the external cause: Secondary | ICD-10-CM | POA: Insufficient documentation

## 2017-09-28 DIAGNOSIS — Z79899 Other long term (current) drug therapy: Secondary | ICD-10-CM | POA: Insufficient documentation

## 2017-09-28 DIAGNOSIS — S82831A Other fracture of upper and lower end of right fibula, initial encounter for closed fracture: Secondary | ICD-10-CM | POA: Insufficient documentation

## 2017-09-28 DIAGNOSIS — Y939 Activity, unspecified: Secondary | ICD-10-CM | POA: Insufficient documentation

## 2017-09-28 DIAGNOSIS — E119 Type 2 diabetes mellitus without complications: Secondary | ICD-10-CM | POA: Insufficient documentation

## 2017-09-28 IMAGING — DX DG ANKLE COMPLETE 3+V*R*
3 series · 3 of 3 positions shown · non-contrast
Comparison: None.

CLINICAL DATA: Slip and fall with right ankle and knee pain.

EXAM:
RIGHT ANKLE - COMPLETE 3+ VIEW

[ankle ap]
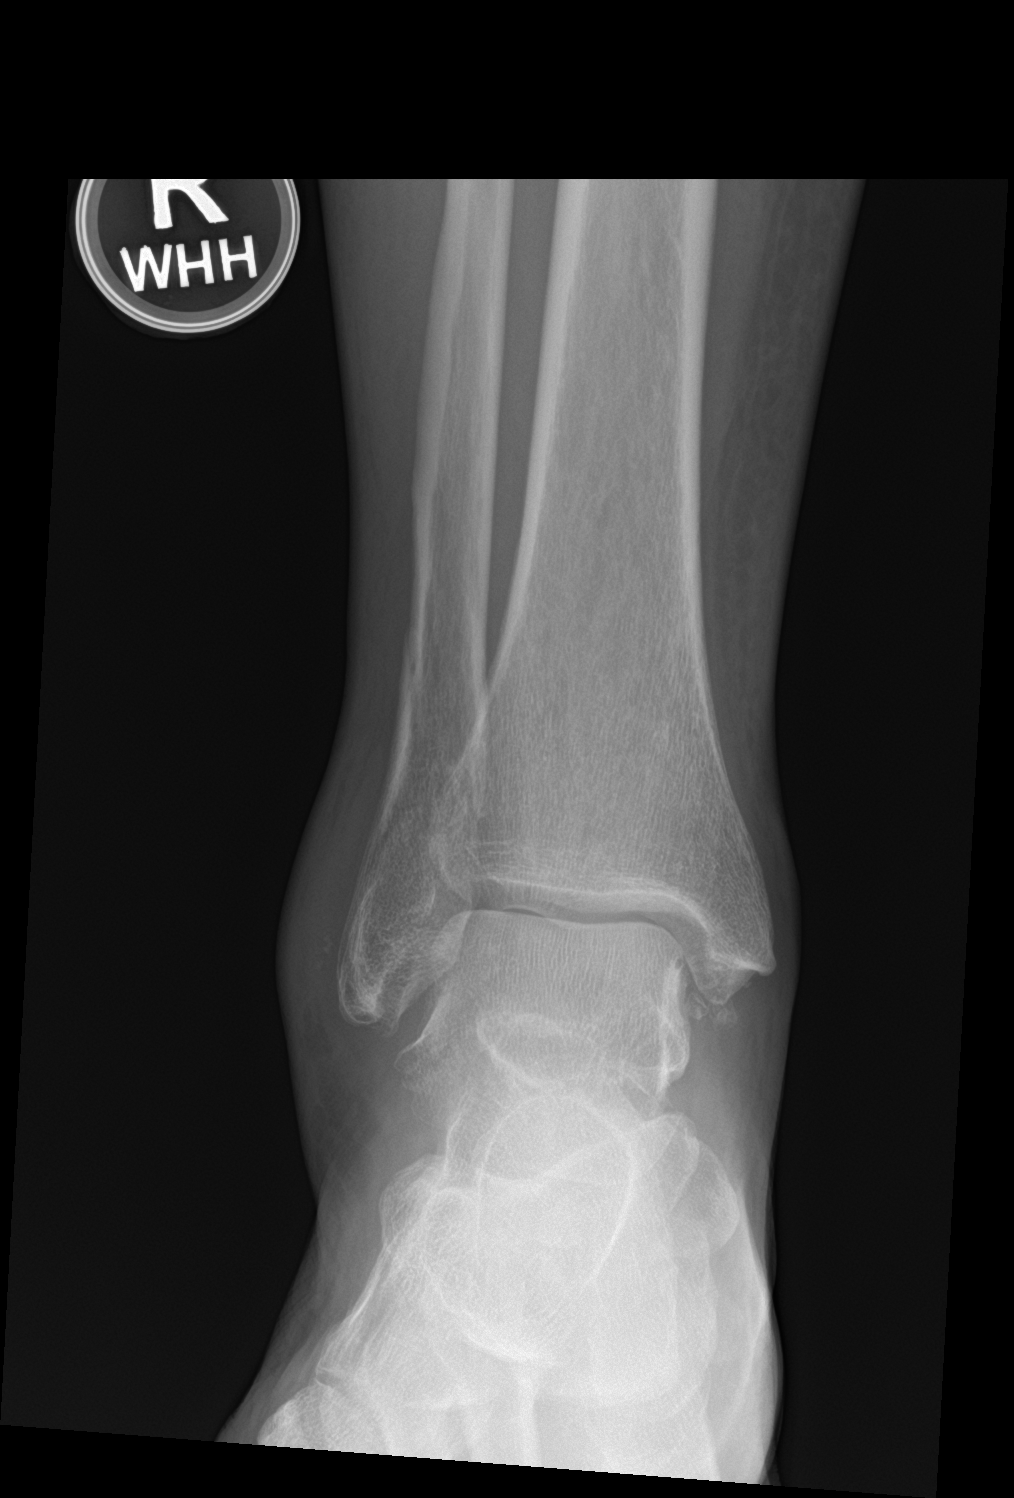

[ankle obl]
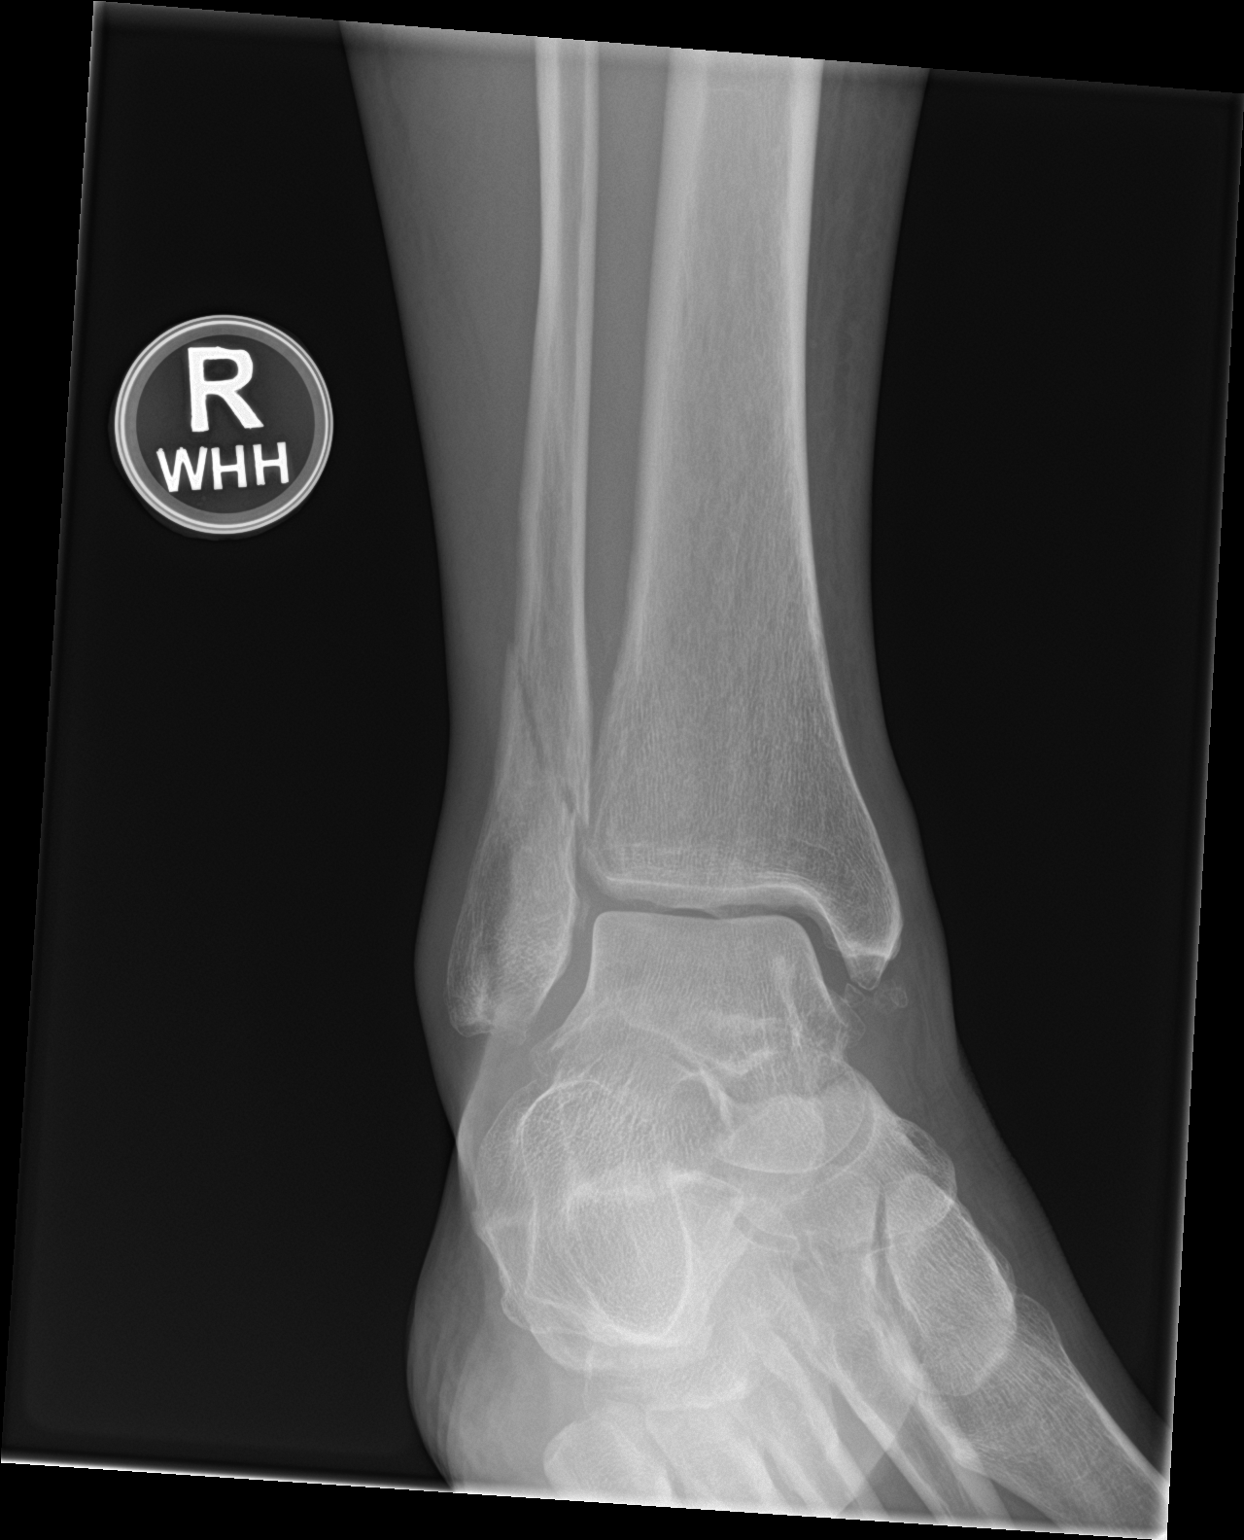

[ankle lat]
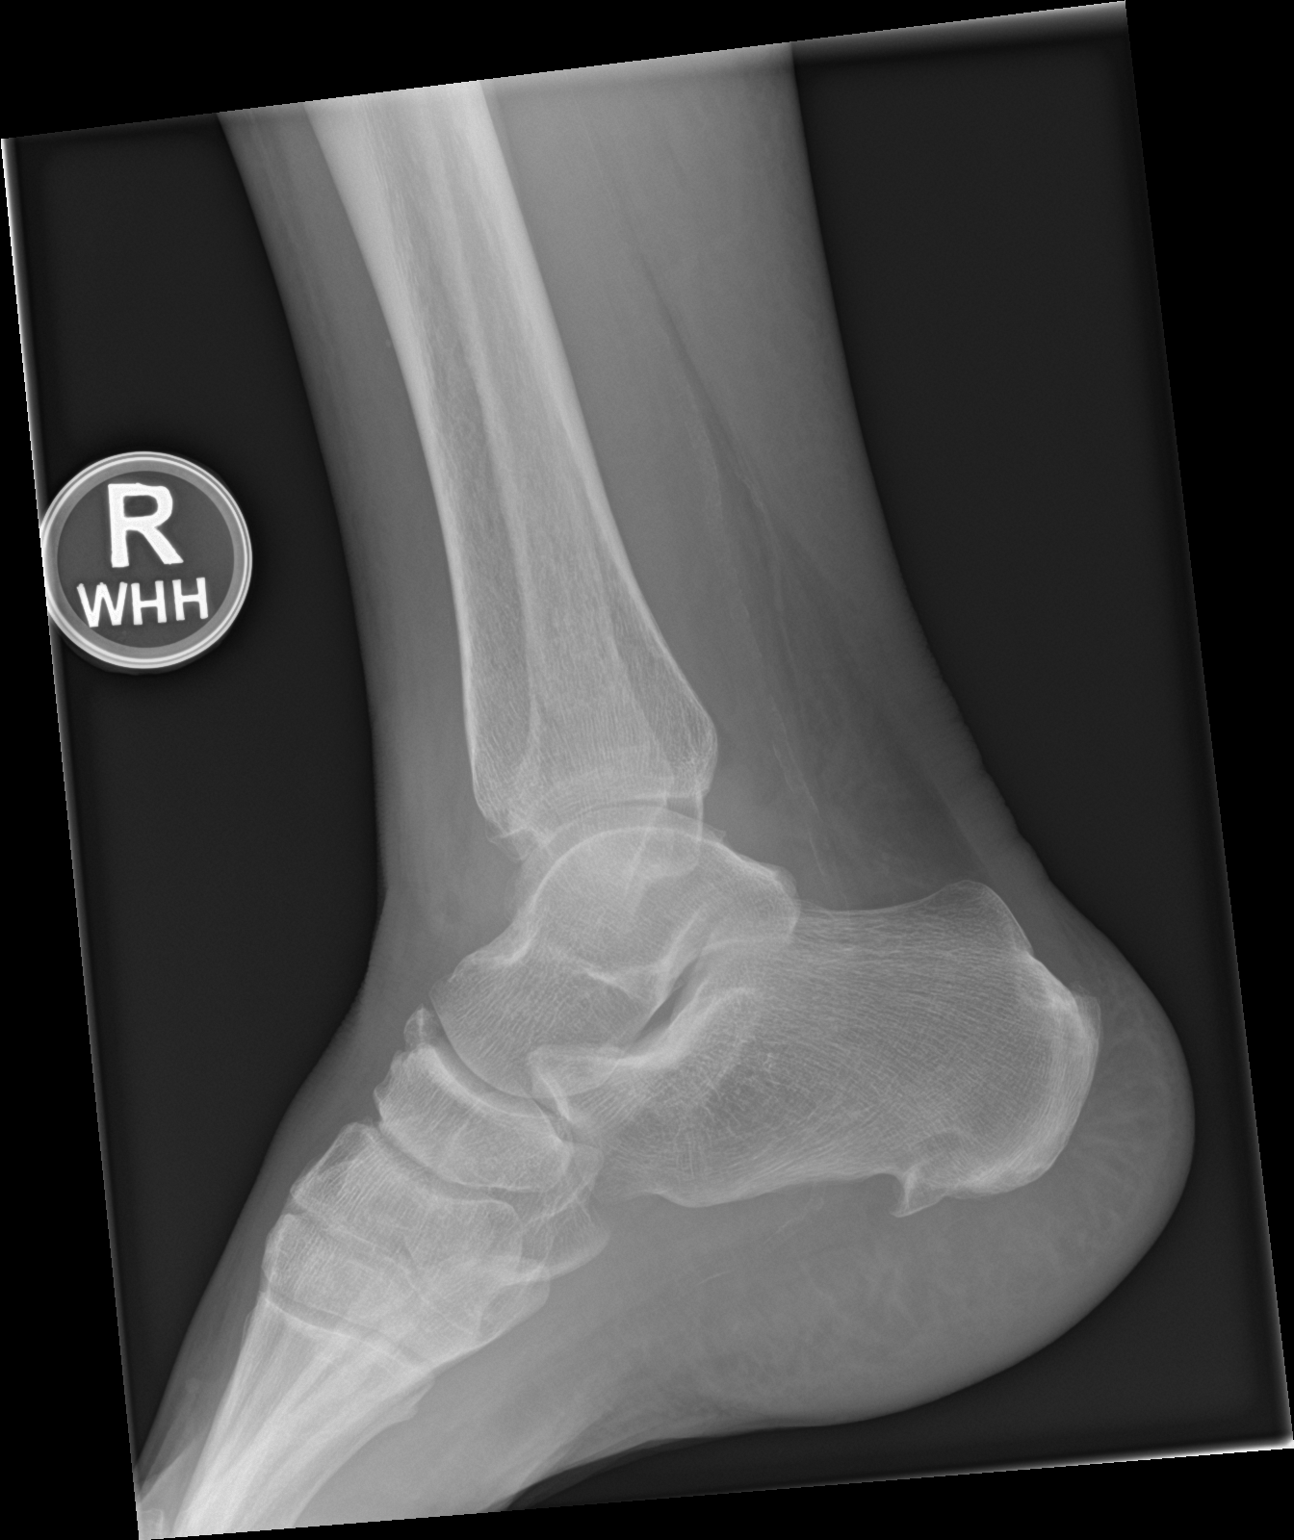

[3 of 3 positions shown; findings below may reference images not displayed]

FINDINGS: Minimally displaced fracture of the distal fibula just proximal to
the ankle mortise. No additional fracture. Small tibiotalar joint
effusion. The ankle mortise is preserved. Small corticated densities
distal to the medial malleolus suggest remote prior injury.
Degenerative spurring versus remote prior injury of the dorsal
navicular at the talonavicular joint. Plantar calcaneal spur. There
are vascular calcifications, advanced for age.
IMPRESSION: Acute minimally displaced distal fibular fractures proximal to the
ankle mortise.

## 2017-09-28 IMAGING — DX DG KNEE COMPLETE 4+V*R*
4 series · 4 of 4 positions shown · non-contrast
Comparison: Radiographs [DATE]

CLINICAL DATA: Slip and fall with right ankle and knee pain.

EXAM:
RIGHT KNEE - COMPLETE 4+ VIEW

[knee ap (1 of 3)]
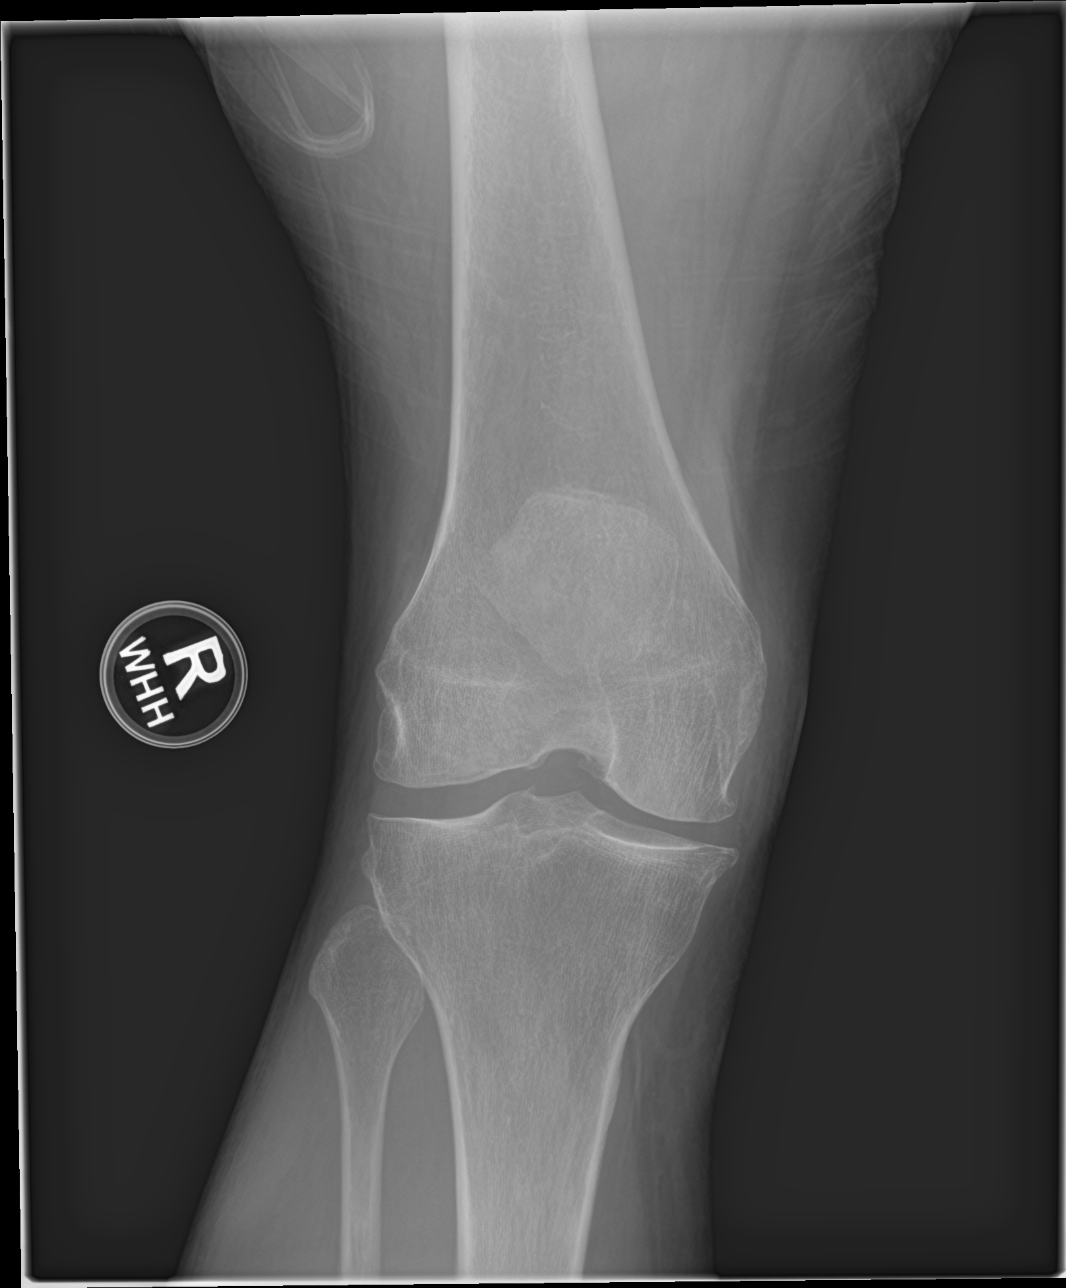

[knee ap (2 of 3)]
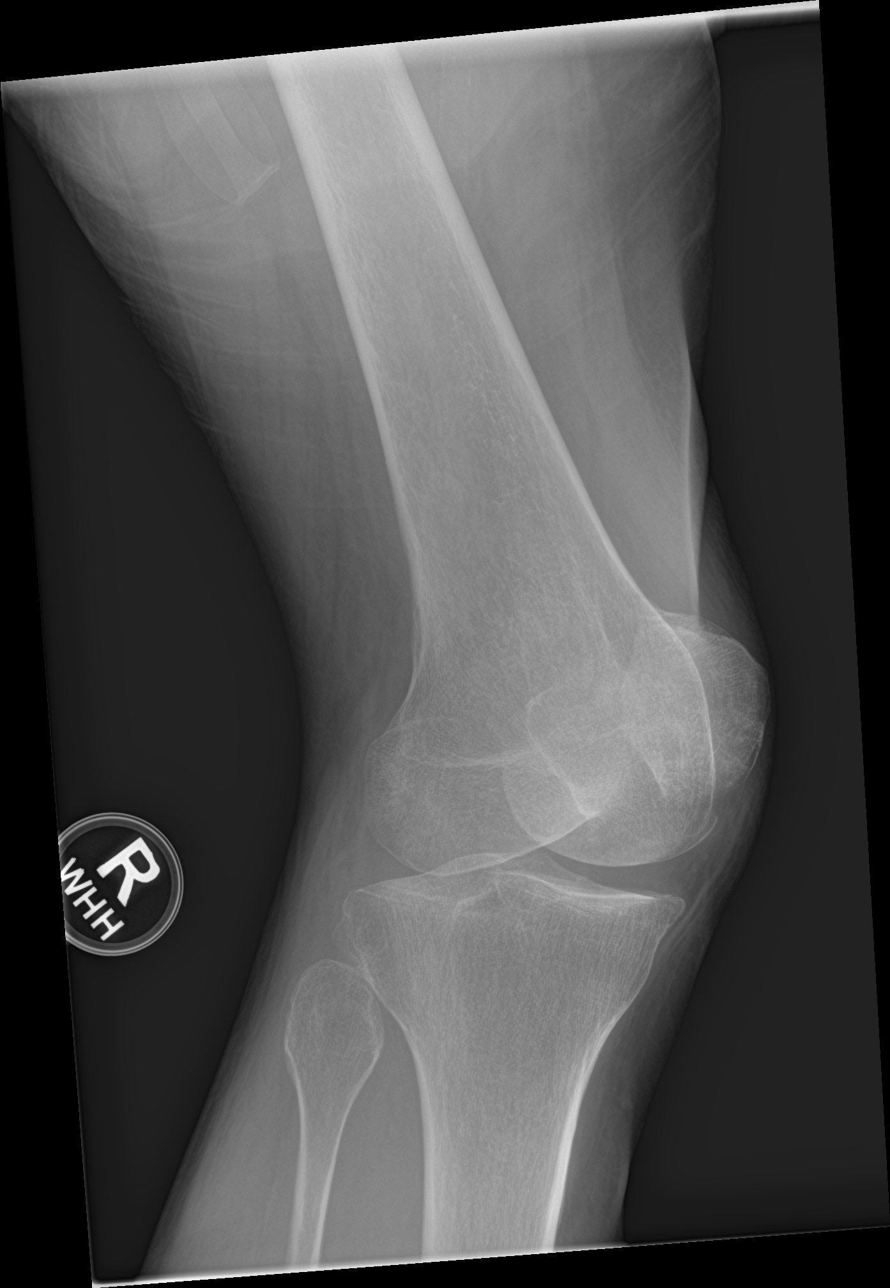

[knee ap (3 of 3)]
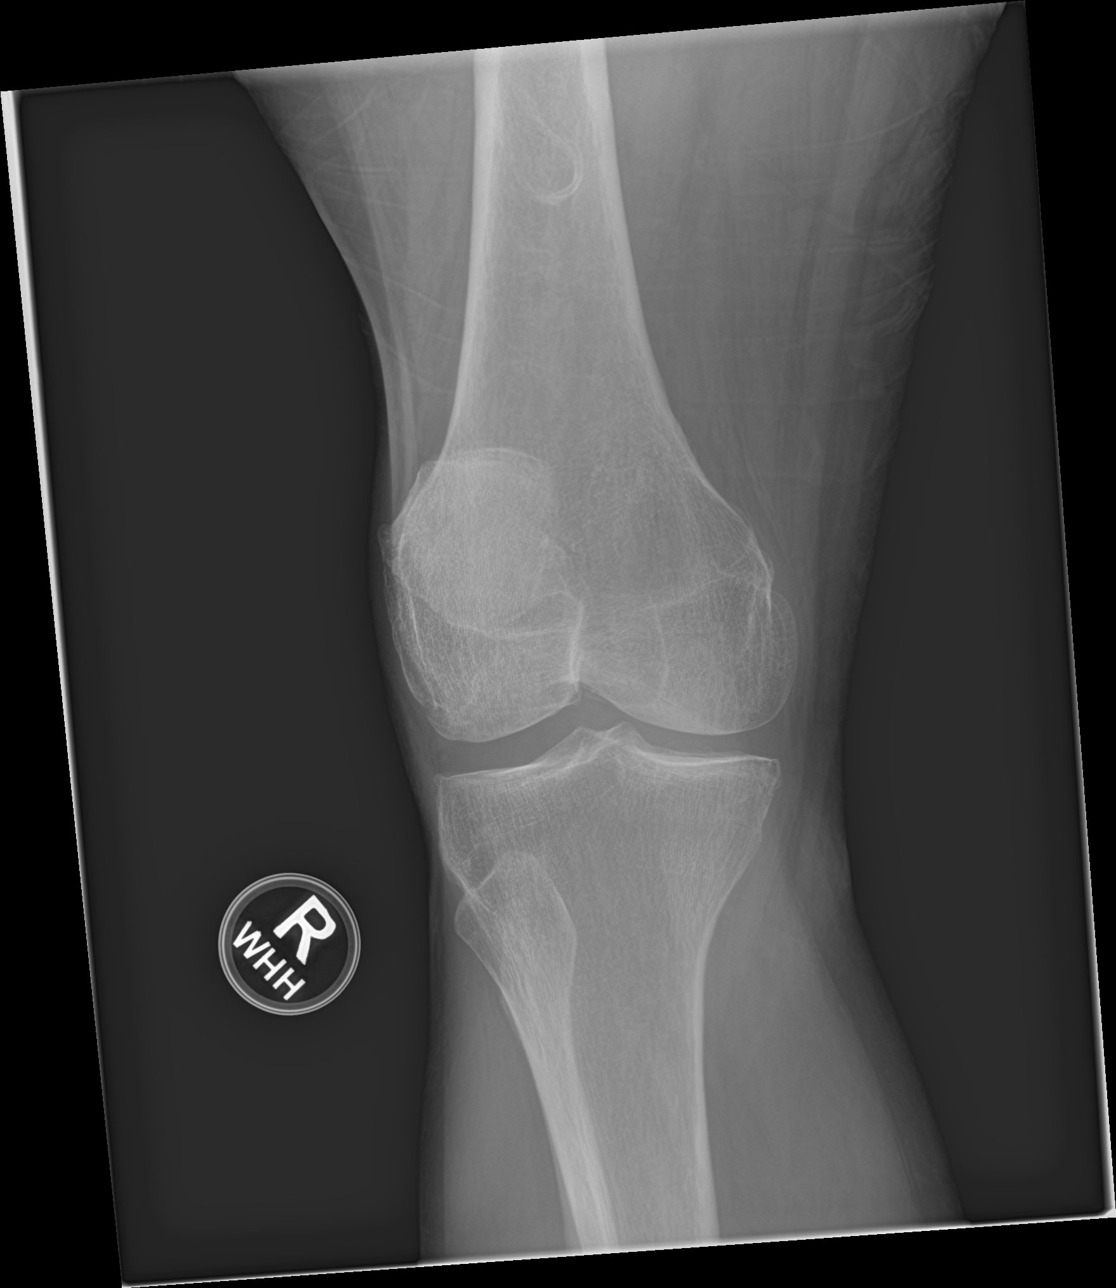

[knee lat]
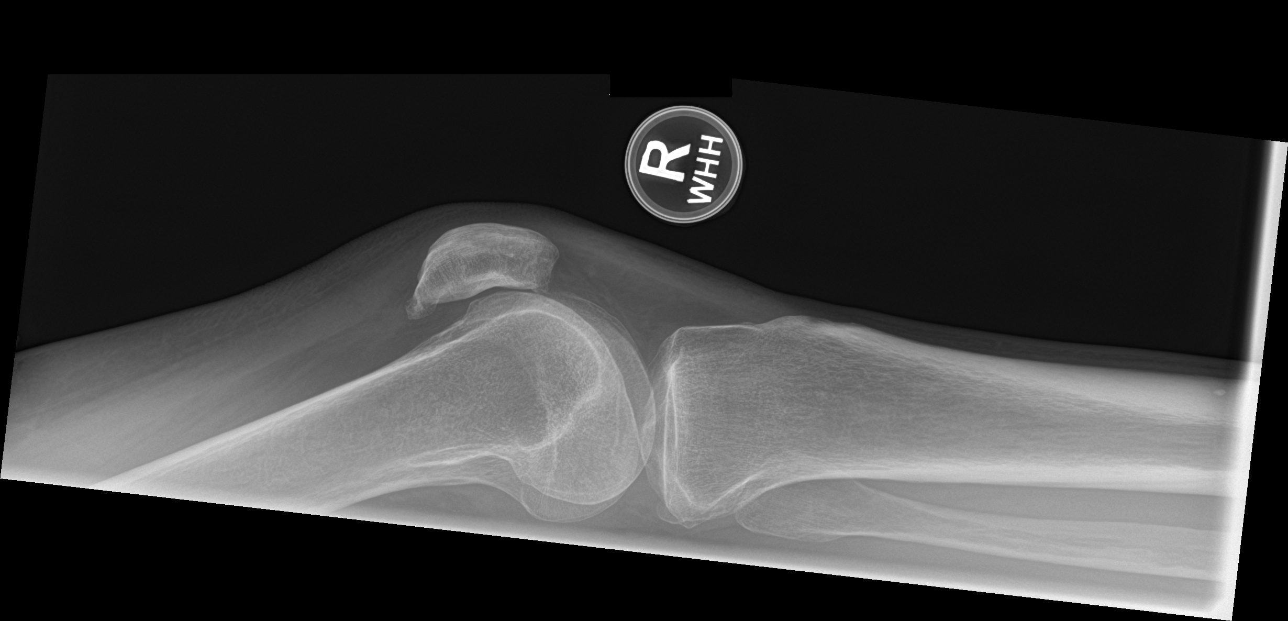

[4 of 4 positions shown; findings below may reference images not displayed]

FINDINGS: No evidence of fracture, dislocation, or joint effusion. Mild
tricompartmental osteoarthritis with peripheral spurring. Medial
soft tissue edema.
IMPRESSION: Mild osteoarthritis without acute osseous abnormality.

## 2017-09-28 MED ORDER — HYDROCODONE-ACETAMINOPHEN 5-325 MG PO TABS: 1.0000 | ORAL_TABLET | ORAL | 0 refills | Status: DC | PRN

## 2017-09-28 MED ORDER — HYDROCODONE-ACETAMINOPHEN 5-325 MG PO TABS
1.0000 | ORAL_TABLET | Freq: Once | ORAL | Status: AC
  Administered 2017-09-28: 1 via ORAL
  Filled 2017-09-28: qty 1

## 2017-09-28 NOTE — ED Provider Notes (Signed)
[facility] EMERGENCY DEPARTMENT Provider Note   CSN: [id number] Arrival date & time: 09/28/17  9735     History   Chief Complaint Chief Complaint  Patient presents with  . Fall    HPI Melanie Hall is a 47 y.o. female.   Patient presents with right ankle and knee pain after fall.  States she slipped in her kitchen about 1 hour ago landing on her right knee and her right ankle turned inward.  Did not hit head or lose consciousness.  No neck or back pain.  No blood thinner use.  No numbness or tingling.  She was able to bear weight and drove herself to the hospital.  The history is provided by the patient.  Fall  Pertinent negatives include no chest pain, no abdominal pain, no headaches and no shortness of breath.    Past Medical History:  Diagnosis Date  . Diabetes mellitus     Patient Active Problem List   Diagnosis Date Noted  . IRREGULAR MENSES 09/14/2009  . BURN, FIRST DEGREE, ARM 09/14/2009  . HYPERCHOLESTEROLEMIA 11/19/2008  . PARONYCHIA, RIGHT GREAT TOE 07/30/2008  . ABSCESS, TOOTH 03/31/2008  . DIABETES MELLITUS, TYPE II 07/02/2007  . CATARACTS, BILATERAL 07/02/2007    Past Surgical History:  Procedure Laterality Date  . KNEE SURGERY    . TUBAL LIGATION       OB History   None      Home Medications    Prior to Admission medications   Medication Sig Start Date End Date Taking? Authorizing Provider  clindamycin (CLEOCIN) 150 MG capsule Take 2 capsules (300 mg total) by mouth 3 (three) times daily. May dispense as 150mg  capsules 09/02/14   Larene Pickett, PA-C  diclofenac (VOLTAREN) 50 MG EC tablet Take 1 tablet (50 mg total) by mouth 2 (two) times daily. 02/11/16   Ashley Murrain, NP  HYDROcodone-acetaminophen (NORCO/VICODIN) 5-325 MG per tablet Take 1 tablet by mouth every 4 (four) hours as needed. 09/02/14   Larene Pickett, PA-C  naproxen (NAPROSYN) 500 MG tablet Take 1 tablet (500 mg total) by mouth 2 (two) times daily. 11/20/16   Evalee Jefferson, PA-C    oxyCODONE-acetaminophen (PERCOCET/ROXICET) 5-325 MG per tablet Take 1-2 tablets by mouth every 6 (six) hours as needed for severe pain. 10/28/13   Delos Haring, PA-C  promethazine (PHENERGAN) 25 MG tablet Take 1 tablet (25 mg total) by mouth every 6 (six) hours as needed for nausea or vomiting. 10/28/13   Delos Haring, PA-C    Family History History reviewed. No pertinent family history.  Social History Social History   Tobacco Use  . Smoking status: Never Smoker  . Smokeless tobacco: Never Used  Substance Use Topics  . Alcohol use: No  . Drug use: No     Allergies   Patient has no known allergies.   Review of Systems Review of Systems  Constitutional: Negative for activity change, appetite change and fever.  HENT: Negative for congestion and rhinorrhea.   Eyes: Negative for visual disturbance.  Respiratory: Negative for cough, chest tightness and shortness of breath.   Cardiovascular: Negative for chest pain.  Gastrointestinal: Negative for abdominal pain, nausea and vomiting.  Genitourinary: Negative for dysuria and hematuria.  Musculoskeletal: Positive for arthralgias and myalgias. Negative for back pain.  Skin: Negative for rash.  Neurological: Negative for dizziness, weakness and headaches.   all other systems are negative except as noted in the HPI and PMH.     Physical Exam  Updated Vital Signs BP (!) 178/99   Pulse 95   Temp 97.8 F (36.6 C) (Oral)   Resp 17   Ht 5\' 2"  (1.575 m)   Wt 63.2 kg (139 lb 6.4 oz)   SpO2 99%   BMI 25.50 kg/m   Physical Exam  Constitutional: She is oriented to person, place, and time. She appears well-developed and well-nourished. No distress.  HENT:  Head: Normocephalic and atraumatic.  Mouth/Throat: Oropharynx is clear and moist. No oropharyngeal exudate.  Eyes: Pupils are equal, round, and reactive to light. Conjunctivae and EOM are normal.  Neck: Normal range of motion. Neck supple.  No C spine tenderness   Cardiovascular: Normal rate, regular rhythm, normal heart sounds and intact distal pulses.  No murmur heard. Pulmonary/Chest: Effort normal and breath sounds normal. No respiratory distress.  Abdominal: Soft. There is no tenderness. There is no rebound and no guarding.  Musculoskeletal: She exhibits edema and tenderness.  Swelling to right lateral ankle with tenderness to palpation.  Decreased range of motion of right ankle.  Intact DP and PT pulses.  Intact Achilles.  Full range of motion of right knee.  No ligament laxity.  Able to lift leg and keep knee extended.   Neurological: She is alert and oriented to person, place, and time. No cranial nerve deficit. She exhibits normal muscle tone. Coordination normal.  No ataxia on finger to nose bilaterally. No pronator drift. 5/5 strength throughout. CN 2-12 intact.Equal grip strength. Sensation intact.   Skin: Skin is warm. Capillary refill takes less than 2 seconds.  Psychiatric: She has a normal mood and affect. Her behavior is normal.  Nursing note and vitals reviewed.    ED Treatments / Results  Labs (all labs ordered are listed, but only abnormal results are displayed) Labs Reviewed - No data to display  EKG None  Radiology Dg Ankle Complete Right  Result Date: 09/28/2017 CLINICAL DATA:  Slip and fall with right ankle and knee pain. EXAM: RIGHT ANKLE - COMPLETE 3+ VIEW COMPARISON:  None. FINDINGS: Minimally displaced fracture of the distal fibula just proximal to the ankle mortise. No additional fracture. Small tibiotalar joint effusion. The ankle mortise is preserved. Small corticated densities distal to the medial malleolus suggest remote prior injury. Degenerative spurring versus remote prior injury of the dorsal navicular at the talonavicular joint. Plantar calcaneal spur. There are vascular calcifications, advanced for age. IMPRESSION: Acute minimally displaced distal fibular fractures proximal to the ankle mortise.  Electronically Signed   By: Jeb Levering M.D.   On: 09/28/2017 05:49   Dg Knee Complete 4 Views Right  Result Date: 09/28/2017 CLINICAL DATA:  Slip and fall with right ankle and knee pain. EXAM: RIGHT KNEE - COMPLETE 4+ VIEW COMPARISON:  Radiographs 02/25/2010 FINDINGS: No evidence of fracture, dislocation, or joint effusion. Mild tricompartmental osteoarthritis with peripheral spurring. Medial soft tissue edema. IMPRESSION: Mild osteoarthritis without acute osseous abnormality. Electronically Signed   By: Jeb Levering M.D.   On: 09/28/2017 05:50    Procedures Procedures (including critical care time)  Medications Ordered in ED Medications  HYDROcodone-acetaminophen (NORCO/VICODIN) 5-325 MG per tablet 1 tablet (1 tablet Oral Given 09/28/17 9532)     Initial Impression / Assessment and Plan / ED Course  I have reviewed the triage vital signs and the nursing notes.  Pertinent labs & imaging results that were available during my care of the patient were reviewed by me and considered in my medical decision making (see chart for details).  Slip and fall with right knee and ankle pain.  Neurovascularly intact.  X-ray shows distal fibula fracture. Patient treated with pain control, elevation, ice, immobilization, crutches and orthopedic follow-up. Return precautions discussed. Final Clinical Impressions(s) / ED Diagnoses   Final diagnoses:  Closed fracture of distal end of right fibula, unspecified fracture morphology, initial encounter    ED Discharge Orders    None       Rebeccah Ivins, Annie Main, MD 09/28/17 [phone]

## 2017-09-28 NOTE — ED Notes (Signed)
Ice pack applied to ankle

## 2017-09-28 NOTE — ED Triage Notes (Signed)
Pt states she slipped this am and c/o right ankle and knee pain. Pt is ambulatory.

## 2017-09-28 NOTE — Discharge Instructions (Signed)
Follow-up with the orthopedic doctor.  Take pain medication as prescribed.  Do not bear weight on right ankle until told to do so by orthopedic doctor.  Return to the ED with worsening pain, swelling, numbness, tingling or any other concerns.

## 2017-09-30 ENCOUNTER — Telehealth: Payer: Self-pay | Admitting: Orthopedic Surgery

## 2017-09-30 NOTE — Telephone Encounter (Signed)
Patient called earlier this morning, following [facility] visit for fracture of right fibula.  Called back to patient to offer appointment and reached voice mail; left message requesting call back.  Patient ph# [phone].

## 2017-09-30 NOTE — Telephone Encounter (Signed)
Patient called back; appointment scheduled; aware.

## 2017-10-02 ENCOUNTER — Encounter: Payer: Self-pay | Admitting: Orthopedic Surgery

## 2017-10-02 ENCOUNTER — Ambulatory Visit: Payer: Self-pay | Admitting: Orthopedic Surgery

## 2017-10-02 VITALS — BP 179/110 | HR 103 | Ht 61.5 in | Wt 141.0 lb

## 2017-10-02 DIAGNOSIS — S8264XA Nondisplaced fracture of lateral malleolus of right fibula, initial encounter for closed fracture: Secondary | ICD-10-CM

## 2017-10-02 MED ORDER — HYDROCODONE-ACETAMINOPHEN 5-325 MG PO TABS: 1.0000 | ORAL_TABLET | Freq: Four times a day (QID) | ORAL | 0 refills | Status: DC | PRN

## 2017-10-02 NOTE — Progress Notes (Signed)
  NEW PATIENT OFFICE VISIT   Chief Complaint  Patient presents with  . Ankle Injury    09/28/17 fracture right ankle, fell in kitchen at home    46 presents for evaluation of right ankle injury  Date of injury March 23 pain is located over the lateral malleolus of the right ankle associated with swelling and dull aching sensation pain with weightbearing is also noted   Review of Systems  Respiratory: Negative.   Cardiovascular: Negative.   Skin: Negative.   Neurological: Negative.      Past Medical History:  Diagnosis Date  . Diabetes mellitus     Past Surgical History:  Procedure Laterality Date  . KNEE SURGERY    . TUBAL LIGATION      Family History  Problem Relation Age of Onset  . Diabetes Mother   . Hypertension Mother   . Bipolar disorder Mother   . Brain cancer Maternal Aunt   . Heart disease Maternal Grandmother   . Diabetes Maternal Grandmother   . Breast cancer Maternal Aunt    Social History   Tobacco Use  . Smoking status: Never Smoker  . Smokeless tobacco: Never Used  Substance Use Topics  . Alcohol use: No  . Drug use: No    No Known Allergies   Current Meds  Medication Sig  . HYDROcodone-acetaminophen (NORCO/VICODIN) 5-325 MG tablet Take 1 tablet by mouth every 4 (four) hours as needed.    BP (!) 179/110   Pulse (!) 103   Ht 5' 1.5" (1.562 m)   Wt 141 lb (64 kg)   BMI 26.21 kg/m   Physical Exam  Constitutional: She is oriented to person, place, and time. She appears well-developed and well-nourished.  Neurological: She is alert and oriented to person, place, and time.  Psychiatric: She has a normal mood and affect. Judgment normal.  Vitals reviewed.   Right Ankle Exam   Tenderness  The patient is experiencing tenderness in the lateral malleolus.  Range of Motion  Dorsiflexion: abnormal  Plantar flexion: abnormal   Muscle Strength  The patient has normal right ankle strength.  Tests  Anterior drawer: negative  Other   Erythema: absent Scars: absent Sensation: normal Pulse: present   Comments:  Lateral swelling   Left Ankle Exam  Left ankle exam is normal.  Tenderness  The patient is experiencing no tenderness.  Swelling: none  Range of Motion  The patient has normal left ankle ROM.   Muscle Strength  The patient has normal left ankle strength.  Tests  Anterior drawer: negative  Other  Erythema: absent Scars: absent Sensation: normal Pulse: present      MEDICAL DECISION SECTION  xrays ordered? NO  My independent reading of xrays: Lateral malleolus fracture Weber B nondisplaced she does have a lot of calcifications on the medial side ankle mortise is intact no other fractures are identified  3 views right knee AP lateral 2 obliques no fracture dislocation is seen may be some mild degenerative changes medially  Encounter Diagnosis  Name Primary?  . Closed nondisplaced fracture of lateral malleolus of right fibula, initial encounter Yes     PLAN:   No orders of the defined types were placed in this encounter.  WBAT IN CAM WALKER   7 DAYS OF PAIN MEDS AND THEN TYLENOL IBUPROFEN

## 2017-11-06 ENCOUNTER — Encounter: Payer: Self-pay | Admitting: Orthopedic Surgery

## 2017-11-06 ENCOUNTER — Ambulatory Visit (INDEPENDENT_AMBULATORY_CARE_PROVIDER_SITE_OTHER): Payer: Self-pay

## 2017-11-06 ENCOUNTER — Ambulatory Visit (INDEPENDENT_AMBULATORY_CARE_PROVIDER_SITE_OTHER): Payer: Self-pay | Admitting: Orthopedic Surgery

## 2017-11-06 VITALS — BP 108/76 | HR 108 | Ht 61.5 in | Wt 137.0 lb

## 2017-11-06 DIAGNOSIS — S82891A Other fracture of right lower leg, initial encounter for closed fracture: Secondary | ICD-10-CM

## 2017-11-06 DIAGNOSIS — S82891D Other fracture of right lower leg, subsequent encounter for closed fracture with routine healing: Secondary | ICD-10-CM

## 2017-11-06 DIAGNOSIS — S8264XA Nondisplaced fracture of lateral malleolus of right fibula, initial encounter for closed fracture: Secondary | ICD-10-CM

## 2017-11-06 HISTORY — DX: Other fracture of right lower leg, initial encounter for closed fracture: S82.891A

## 2017-11-06 MED ORDER — HYDROCODONE-ACETAMINOPHEN 5-325 MG PO TABS: 1.0000 | ORAL_TABLET | Freq: Four times a day (QID) | ORAL | 0 refills | Status: DC | PRN

## 2017-11-06 NOTE — Progress Notes (Signed)
Fracture care follow-up  Chief Complaint  Patient presents with  . Fracture Care    Right Ankle DOI 09/28/17    Encounter Diagnosis  Name Primary?  . Closed fracture of right ankle with routine healing, subsequent encounter 09/28/17 Yes     Current Outpatient Medications:  .  HYDROcodone-acetaminophen (NORCO) 5-325 MG tablet, Take 1 tablet by mouth every 6 (six) hours as needed for moderate pain., Disp: 30 tablet, Rfl: 0   Melanie Hall complains of pain along the fibula she is able to walk in the walker  BP 108/76   Pulse (!) 108   Ht 5' 1.5" (1.562 m)   Wt 137 lb (62.1 kg)   BMI 25.47 kg/m   Physical Exam  Fracture site tenderness no ankle deformity painful range of motion in flexion-extension Xrays: Fracture is healing appropriately nondisplaced with intact ankle mortise  Plan   Continue Cam walker x-ray in 6 weeks

## 2017-12-12 ENCOUNTER — Ambulatory Visit: Payer: Self-pay | Admitting: Physician Assistant

## 2017-12-17 ENCOUNTER — Ambulatory Visit: Payer: Self-pay | Admitting: Physician Assistant

## 2017-12-18 ENCOUNTER — Ambulatory Visit: Payer: Self-pay | Admitting: Orthopedic Surgery

## 2017-12-19 ENCOUNTER — Ambulatory Visit: Payer: Self-pay | Admitting: Physician Assistant

## 2017-12-23 ENCOUNTER — Encounter: Payer: Self-pay | Admitting: Physician Assistant

## 2018-05-08 ENCOUNTER — Ambulatory Visit: Payer: Self-pay | Admitting: Physician Assistant

## 2018-05-22 ENCOUNTER — Encounter: Payer: Self-pay | Admitting: Physician Assistant

## 2018-07-19 ENCOUNTER — Emergency Department (HOSPITAL_COMMUNITY)
Admission: EM | Admit: 2018-07-19 | Discharge: 2018-07-19 | Disposition: A | Discharge status: Home / Self Care | Payer: Self-pay | Attending: Emergency Medicine | Admitting: Emergency Medicine

## 2018-07-19 ENCOUNTER — Encounter (HOSPITAL_COMMUNITY): Payer: Self-pay | Admitting: *Deleted

## 2018-07-19 ENCOUNTER — Emergency Department (HOSPITAL_COMMUNITY): Payer: Self-pay

## 2018-07-19 ENCOUNTER — Other Ambulatory Visit: Payer: Self-pay

## 2018-07-19 DIAGNOSIS — E119 Type 2 diabetes mellitus without complications: Secondary | ICD-10-CM | POA: Insufficient documentation

## 2018-07-19 DIAGNOSIS — S298XXA Other specified injuries of thorax, initial encounter: Secondary | ICD-10-CM | POA: Insufficient documentation

## 2018-07-19 DIAGNOSIS — Y999 Unspecified external cause status: Secondary | ICD-10-CM | POA: Insufficient documentation

## 2018-07-19 DIAGNOSIS — Y939 Activity, unspecified: Secondary | ICD-10-CM | POA: Insufficient documentation

## 2018-07-19 DIAGNOSIS — Y929 Unspecified place or not applicable: Secondary | ICD-10-CM | POA: Insufficient documentation

## 2018-07-19 DIAGNOSIS — W19XXXA Unspecified fall, initial encounter: Secondary | ICD-10-CM | POA: Insufficient documentation

## 2018-07-19 IMAGING — DX DG RIBS W/ CHEST 3+V*L*
5 series · 5 of 5 positions shown · non-contrast
Comparison: CT chest [DATE]

CLINICAL DATA: Left anterior and axillary rib pain after a fall a
few days ago. History of diabetes.

EXAM:
LEFT RIBS AND CHEST - 3+ VIEW

[chest pa]
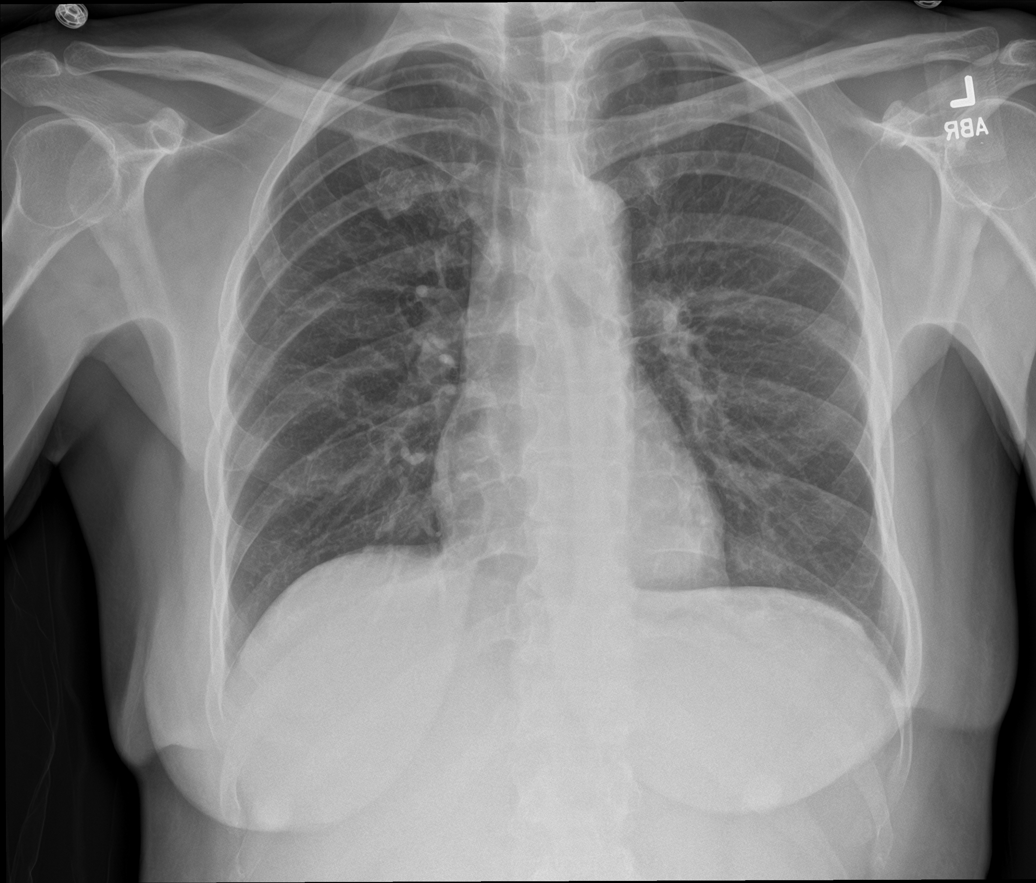

[rib pa obl (1 of 2)]
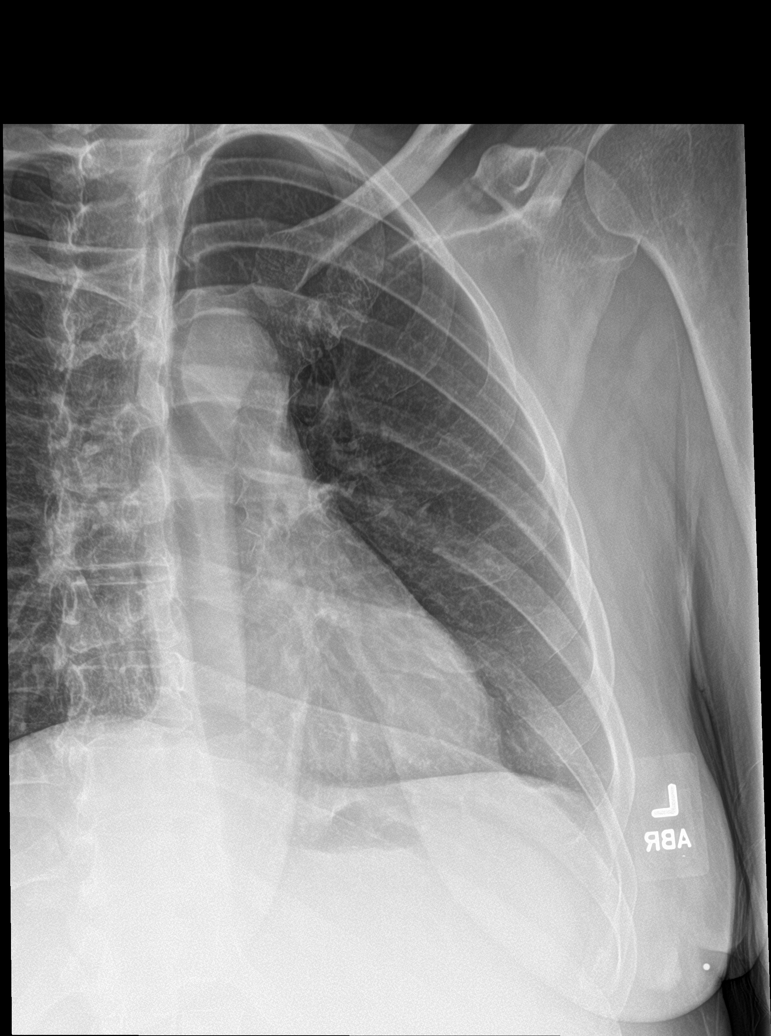

[rib pa obl (2 of 2)]
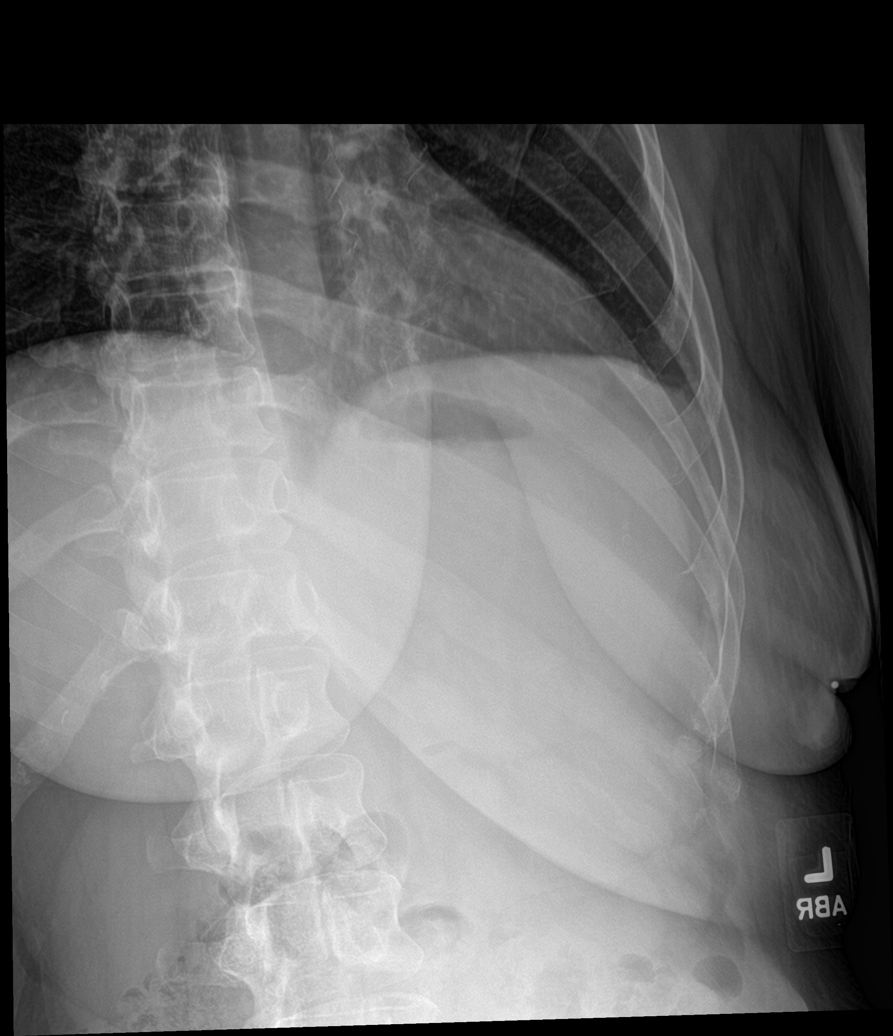

[rib pa (1 of 2)]
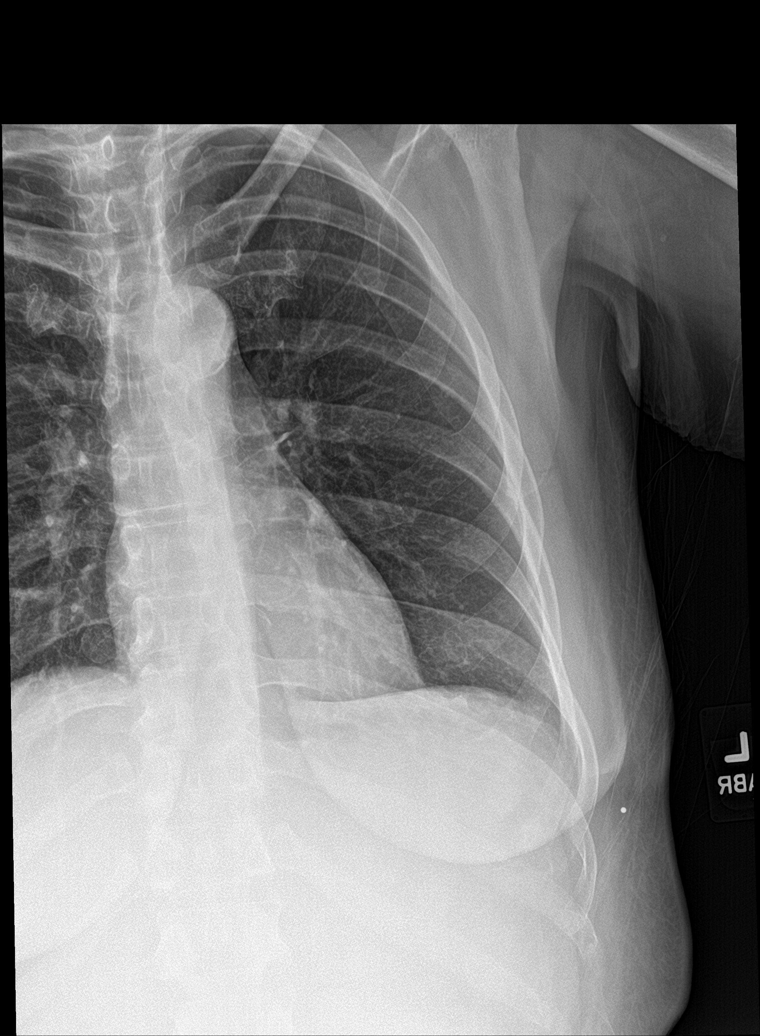

[rib pa (2 of 2)]
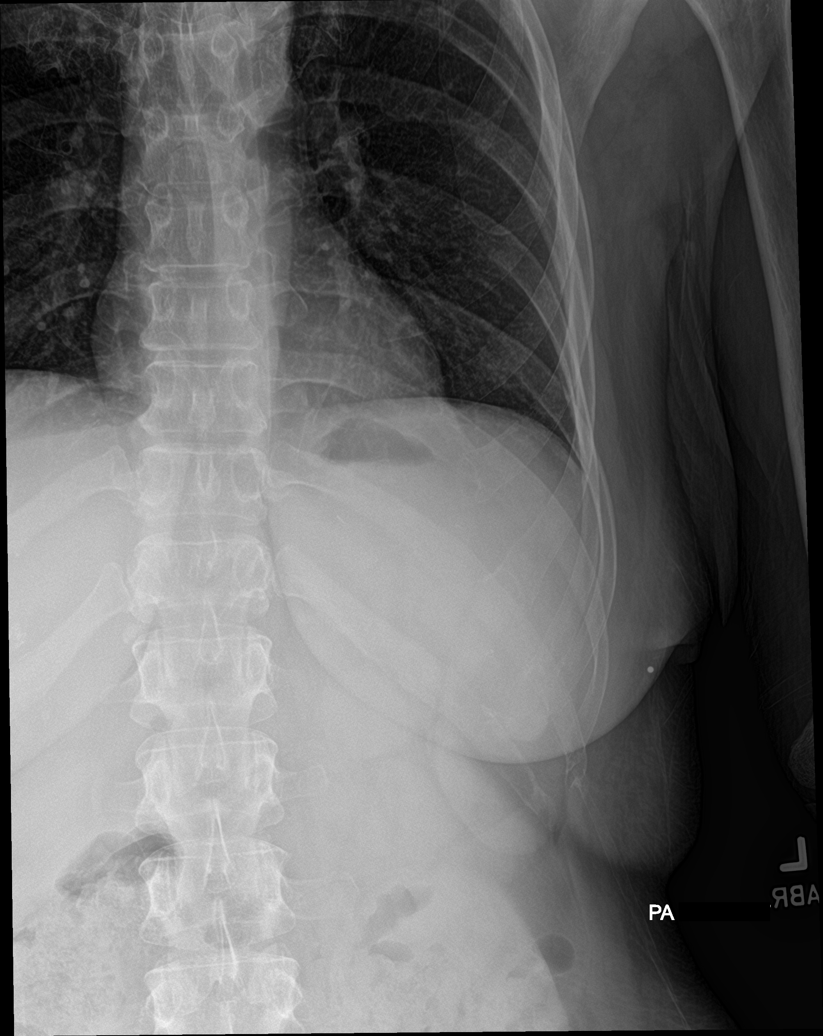

[5 of 5 positions shown; findings below may reference images not displayed]

FINDINGS: Normal heart size and pulmonary vascularity. No focal airspace
disease or consolidation in the lungs. No blunting of costophrenic
angles. No pneumothorax. Mediastinal contours appear intact.

The left ribs appear intact. No acute displaced fractures
identified. No focal bone lesion or bone destruction. Soft tissues
are unremarkable.
IMPRESSION: 1. No evidence of active pulmonary disease. Negative left ribs.
2. No acute rib fracture or bone destruction.

## 2018-07-19 MED ORDER — HYDROCODONE-ACETAMINOPHEN 5-325 MG PO TABS
1.0000 | ORAL_TABLET | Freq: Once | ORAL | Status: AC
  Administered 2018-07-19: 1 via ORAL
  Filled 2018-07-19: qty 1

## 2018-07-19 NOTE — ED Provider Notes (Signed)
[facility] EMERGENCY DEPARTMENT Provider Note   CSN: [id number] Arrival date & time: 07/19/18  0127     History   Chief Complaint Chief Complaint  Patient presents with  . Rib Injury    HPI Melanie Hall is a 48 y.o. female.  The history is provided by the patient.  Chest Pain  Pain location:  L chest Pain quality: aching   Pain radiates to:  Does not radiate Pain severity:  Moderate Onset quality:  Sudden Timing:  Constant Progression:  Worsening Chronicity:  New Relieved by:  Nothing Worsened by:  Certain positions Associated symptoms: shortness of breath   Associated symptoms: no vomiting   Patient presents for left sided rib injury.  She reports 2 days ago her hand slipped while in bed and she heard a crunch in her left chest/ribs.  She did not fall off the bed.  She is also reporting pain in her left shoulder.  She reports mild shortness of breath, no hemoptysis. Patient request the daughter is present for entire history and physical Past Medical History:  Diagnosis Date  . Diabetes mellitus     Patient Active Problem List   Diagnosis Date Noted  . Closed fracture of right ankle 11/06/2017  . IRREGULAR MENSES 09/14/2009  . BURN, FIRST DEGREE, ARM 09/14/2009  . HYPERCHOLESTEROLEMIA 11/19/2008  . PARONYCHIA, RIGHT GREAT TOE 07/30/2008  . ABSCESS, TOOTH 03/31/2008  . DIABETES MELLITUS, TYPE II 07/02/2007  . CATARACTS, BILATERAL 07/02/2007    Past Surgical History:  Procedure Laterality Date  . KNEE SURGERY    . TUBAL LIGATION       OB History   No obstetric history on file.      Home Medications    Prior to Admission medications   Medication Sig Start Date End Date Taking? Authorizing Provider  HYDROcodone-acetaminophen (NORCO) 5-325 MG tablet Take 1 tablet by mouth every 6 (six) hours as needed for moderate pain. 11/06/17   Carole Civil, MD    Family History Family History  Problem Relation Age of Onset  . Diabetes Mother   .  Hypertension Mother   . Bipolar disorder Mother   . Brain cancer Maternal Aunt   . Heart disease Maternal Grandmother   . Diabetes Maternal Grandmother   . Breast cancer Maternal Aunt     Social History Social History   Tobacco Use  . Smoking status: Never Smoker  . Smokeless tobacco: Never Used  Substance Use Topics  . Alcohol use: No  . Drug use: No     Allergies   Patient has no known allergies.   Review of Systems Review of Systems  Respiratory: Positive for shortness of breath.   Cardiovascular: Positive for chest pain.  Gastrointestinal: Negative for vomiting.  All other systems reviewed and are negative.    Physical Exam Updated Vital Signs BP (!) 182/102   Pulse 92   Temp 98.1 F (36.7 C) (Oral)   Resp 20   Ht 1.6 m (5\' 3" )   Wt 64 kg   SpO2 98%   BMI 24.98 kg/m   Physical Exam  CONSTITUTIONAL: Well developed/well nourished HEAD: Normocephalic/atraumatic EYES: EOMI/PERRL ENMT: Mucous membranes moist NECK: supple no meningeal signs SPINE/BACK:entire spine nontender CV: S1/S2 noted, no murmurs/rubs/gallops noted LUNGS: Lungs are clear to auscultation bilaterally, no apparent distress Chest-no bruising, diffuse tenderness to left chest.  No crepitus nurse Rip Harbour present for exam ABDOMEN: soft, nontender, no rebound or guarding, bowel sounds noted throughout abdomen, no bruising, no  LUQ tenderness GU:no cva tenderness NEURO: Pt is awake/alert/appropriate, moves all extremitiesx4.  No facial droop.   EXTREMITIES: pulses normal/equal, full ROM, full range of motion left shoulder, no deformity SKIN: warm, color normal PSYCH: no abnormalities of mood noted, alert and oriented to situation  ED Treatments / Results  Labs (all labs ordered are listed, but only abnormal results are displayed) Labs Reviewed - No data to display  EKG None  Radiology Dg Ribs Unilateral W/chest Left  Result Date: 07/19/2018 CLINICAL DATA:  Left anterior and axillary  rib pain after a fall a few days ago. History of diabetes. EXAM: LEFT RIBS AND CHEST - 3+ VIEW COMPARISON:  CT chest 06/20/2008 FINDINGS: Normal heart size and pulmonary vascularity. No focal airspace disease or consolidation in the lungs. No blunting of costophrenic angles. No pneumothorax. Mediastinal contours appear intact. The left ribs appear intact. No acute displaced fractures identified. No focal bone lesion or bone destruction. Soft tissues are unremarkable. IMPRESSION: 1. No evidence of active pulmonary disease. Negative left ribs. 2. No acute rib fracture or bone destruction. Electronically Signed   By: Lucienne Capers M.D.   On: 07/19/2018 02:56    Procedures Procedures (including critical care time)  Medications Ordered in ED Medications  HYDROcodone-acetaminophen (NORCO/VICODIN) 5-325 MG per tablet 1 tablet (1 tablet Oral Given 07/19/18 0228)     Initial Impression / Assessment and Plan / ED Course  I have reviewed the triage vital signs and the nursing notes.  Pertinent  imaging results that were available during my care of the patient were reviewed by me and considered in my medical decision making (see chart for details).     Imaging negative for acute fracture.  No signs of lung injury.  Patient is in no distress.  Will discharge home  Final Clinical Impressions(s) / ED Diagnoses   Final diagnoses:  Blunt trauma to chest, initial encounter    ED Discharge Orders    None       Ripley Fraise, MD 07/19/18 [phone]

## 2018-07-19 NOTE — ED Triage Notes (Signed)
Pt states that she fell a few days ago hitting her left rib cage area, c/o pain to left rib and left shoulder area. Pain is worse with movement,

## 2018-08-26 ENCOUNTER — Emergency Department (HOSPITAL_COMMUNITY): Payer: Self-pay

## 2018-08-26 ENCOUNTER — Other Ambulatory Visit: Payer: Self-pay

## 2018-08-26 ENCOUNTER — Inpatient Hospital Stay (HOSPITAL_COMMUNITY)
Admission: EM | Admit: 2018-08-26 | Discharge: 2018-08-28 | DRG: 690 | Disposition: A | Discharge status: Home / Self Care | Payer: Self-pay | Attending: Internal Medicine | Admitting: Internal Medicine

## 2018-08-26 ENCOUNTER — Encounter (HOSPITAL_COMMUNITY): Payer: Self-pay | Admitting: Emergency Medicine

## 2018-08-26 DIAGNOSIS — Z9181 History of falling: Secondary | ICD-10-CM

## 2018-08-26 DIAGNOSIS — W010XXA Fall on same level from slipping, tripping and stumbling without subsequent striking against object, initial encounter: Secondary | ICD-10-CM | POA: Diagnosis present

## 2018-08-26 DIAGNOSIS — N760 Acute vaginitis: Secondary | ICD-10-CM

## 2018-08-26 DIAGNOSIS — R739 Hyperglycemia, unspecified: Secondary | ICD-10-CM

## 2018-08-26 DIAGNOSIS — E1165 Type 2 diabetes mellitus with hyperglycemia: Secondary | ICD-10-CM

## 2018-08-26 DIAGNOSIS — E46 Unspecified protein-calorie malnutrition: Secondary | ICD-10-CM | POA: Diagnosis present

## 2018-08-26 DIAGNOSIS — E1121 Type 2 diabetes mellitus with diabetic nephropathy: Secondary | ICD-10-CM | POA: Diagnosis present

## 2018-08-26 DIAGNOSIS — M25462 Effusion, left knee: Secondary | ICD-10-CM | POA: Diagnosis present

## 2018-08-26 DIAGNOSIS — S8002XA Contusion of left knee, initial encounter: Secondary | ICD-10-CM

## 2018-08-26 DIAGNOSIS — Z833 Family history of diabetes mellitus: Secondary | ICD-10-CM

## 2018-08-26 DIAGNOSIS — D649 Anemia, unspecified: Secondary | ICD-10-CM

## 2018-08-26 DIAGNOSIS — S80212A Abrasion, left knee, initial encounter: Secondary | ICD-10-CM | POA: Diagnosis present

## 2018-08-26 DIAGNOSIS — Z6824 Body mass index (BMI) 24.0-24.9, adult: Secondary | ICD-10-CM

## 2018-08-26 DIAGNOSIS — E876 Hypokalemia: Secondary | ICD-10-CM | POA: Diagnosis present

## 2018-08-26 DIAGNOSIS — S93402A Sprain of unspecified ligament of left ankle, initial encounter: Secondary | ICD-10-CM | POA: Diagnosis present

## 2018-08-26 DIAGNOSIS — E1136 Type 2 diabetes mellitus with diabetic cataract: Secondary | ICD-10-CM | POA: Diagnosis present

## 2018-08-26 DIAGNOSIS — D509 Iron deficiency anemia, unspecified: Secondary | ICD-10-CM | POA: Diagnosis present

## 2018-08-26 DIAGNOSIS — N3080 Other cystitis without hematuria: Secondary | ICD-10-CM

## 2018-08-26 DIAGNOSIS — K219 Gastro-esophageal reflux disease without esophagitis: Secondary | ICD-10-CM | POA: Diagnosis present

## 2018-08-26 DIAGNOSIS — R030 Elevated blood-pressure reading, without diagnosis of hypertension: Secondary | ICD-10-CM | POA: Diagnosis present

## 2018-08-26 DIAGNOSIS — Z8249 Family history of ischemic heart disease and other diseases of the circulatory system: Secondary | ICD-10-CM

## 2018-08-26 DIAGNOSIS — G8911 Acute pain due to trauma: Secondary | ICD-10-CM | POA: Diagnosis present

## 2018-08-26 HISTORY — DX: Other cystitis without hematuria: N30.80

## 2018-08-26 HISTORY — DX: Anemia, unspecified: D64.9

## 2018-08-26 HISTORY — DX: Hypokalemia: E87.6

## 2018-08-26 HISTORY — DX: Acute vaginitis: N76.0

## 2018-08-26 LAB — CBG MONITORING, ED: Glucose-Capillary: 367 mg/dL — ABNORMAL HIGH (ref 70–99)

## 2018-08-26 LAB — CBC WITH DIFFERENTIAL/PLATELET
Abs Immature Granulocytes: 0.08 10*3/uL — ABNORMAL HIGH (ref 0.00–0.07)
BASOS ABS: 0.1 10*3/uL (ref 0.0–0.1)
Basophils Relative: 1 %
Eosinophils Absolute: 0.4 10*3/uL (ref 0.0–0.5)
Eosinophils Relative: 4 %
HCT: 30.3 % — ABNORMAL LOW (ref 36.0–46.0)
Hemoglobin: 10.6 g/dL — ABNORMAL LOW (ref 12.0–15.0)
Immature Granulocytes: 1 %
LYMPHS PCT: 22 %
Lymphs Abs: 2.2 10*3/uL (ref 0.7–4.0)
MCH: 30.6 pg (ref 26.0–34.0)
MCHC: 35 g/dL (ref 30.0–36.0)
MCV: 87.6 fL (ref 80.0–100.0)
Monocytes Absolute: 1 10*3/uL (ref 0.1–1.0)
Monocytes Relative: 9 %
NRBC: 0 % (ref 0.0–0.2)
Neutro Abs: 6.7 10*3/uL (ref 1.7–7.7)
Neutrophils Relative %: 63 %
Platelets: 235 10*3/uL (ref 150–400)
RBC: 3.46 MIL/uL — ABNORMAL LOW (ref 3.87–5.11)
RDW: 11.9 % (ref 11.5–15.5)
WBC: 10.3 10*3/uL (ref 4.0–10.5)

## 2018-08-26 LAB — URINALYSIS, ROUTINE W REFLEX MICROSCOPIC
Bilirubin Urine: NEGATIVE
Glucose, UA: >=500 mg/dL — AB
Ketones, ur: NEGATIVE mg/dL
NITRITE: NEGATIVE
PH: 5 (ref 5.0–8.0)
Protein, ur: >=300 mg/dL — AB
Specific Gravity, Urine: 1.028 (ref 1.005–1.030)
WBC, UA: >50 WBC/hpf — ABNORMAL HIGH (ref 0–5)

## 2018-08-26 LAB — COMPREHENSIVE METABOLIC PANEL
ALT: 11 U/L (ref 0–44)
AST: 16 U/L (ref 15–41)
Albumin: 2.1 g/dL — ABNORMAL LOW (ref 3.5–5.0)
Alkaline Phosphatase: 87 U/L (ref 38–126)
Anion gap: 8 (ref 5–15)
BUN: 13 mg/dL (ref 6–20)
CHLORIDE: 102 mmol/L (ref 98–111)
CO2: 22 mmol/L (ref 22–32)
Calcium: 7.8 mg/dL — ABNORMAL LOW (ref 8.9–10.3)
Creatinine, Ser: 0.98 mg/dL (ref 0.44–1.00)
GFR calc Af Amer: >60 mL/min (ref >60)
GFR calc non Af Amer: >60 mL/min (ref >60)
Glucose, Bld: 420 mg/dL — ABNORMAL HIGH (ref 70–99)
Potassium: 2.9 mmol/L — ABNORMAL LOW (ref 3.5–5.1)
Sodium: 132 mmol/L — ABNORMAL LOW (ref 135–145)
Total Bilirubin: 0.9 mg/dL (ref 0.3–1.2)
Total Protein: 5.6 g/dL — ABNORMAL LOW (ref 6.5–8.1)

## 2018-08-26 LAB — LIPASE, BLOOD: Lipase: 24 U/L (ref 11–51)

## 2018-08-26 IMAGING — CT CT ABD-PELV W/ CM
2 of 6 series · 15 of 46 positions shown, 17 images · IV contrast (Isovue)
Comparison: Abdominal ultrasound on [DATE]

CLINICAL DATA: Fall yesterday. Right lower quadrant and groin pain.

EXAM:
CT ABDOMEN AND PELVIS WITH CONTRAST
TECHNIQUE: Multidetector CT imaging of the abdomen and pelvis was performed
using the standard protocol following bolus administration of
intravenous contrast.
CONTRAST:  100mL OMNIPAQUE IOHEXOL 300 MG/ML  SOLN

[Series 2: axial st · axial · 0.69mm/px · z∈[-484,-79]mm · 12 of 93 slices shown, 14 images]
[im 6/93  soft-tissue]
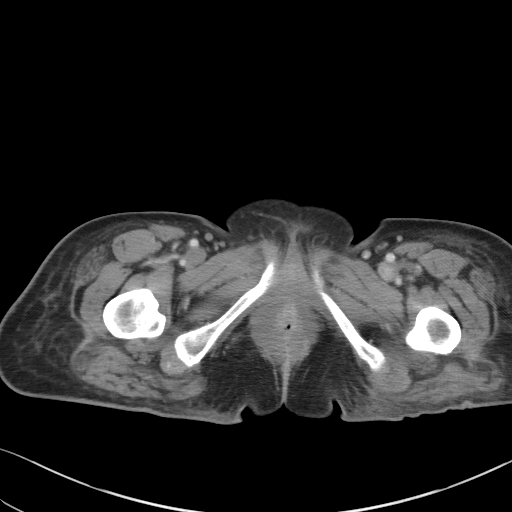
[im 6/93  bone]
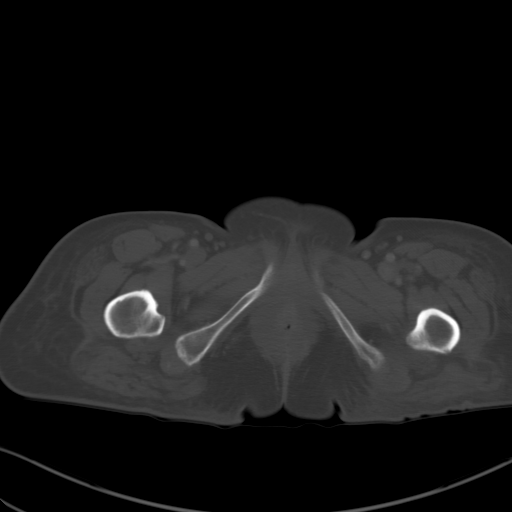
[im 12/93  soft-tissue]
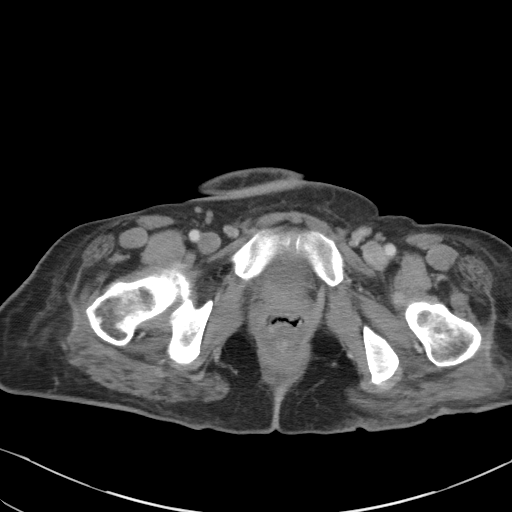
[im 24/93  soft-tissue]
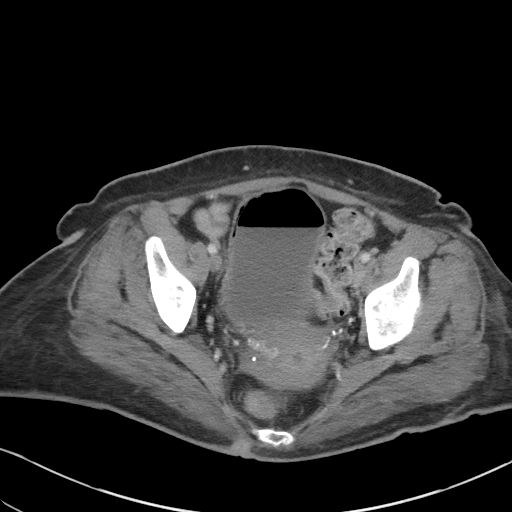
[im 29/93  soft-tissue]
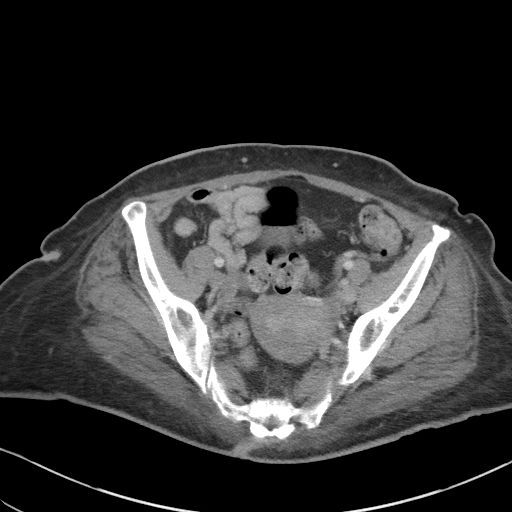
[im 35/93  soft-tissue]
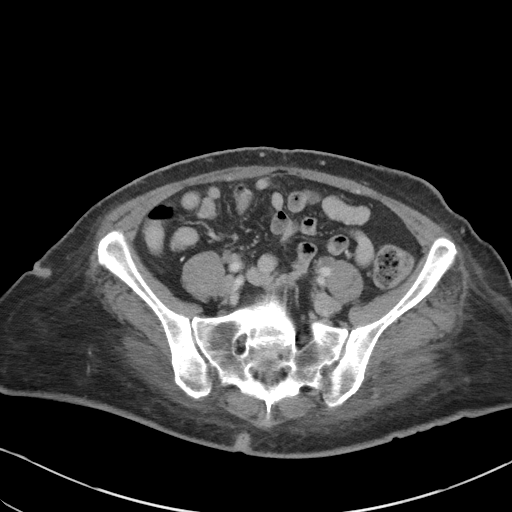
[im 41/93  soft-tissue]
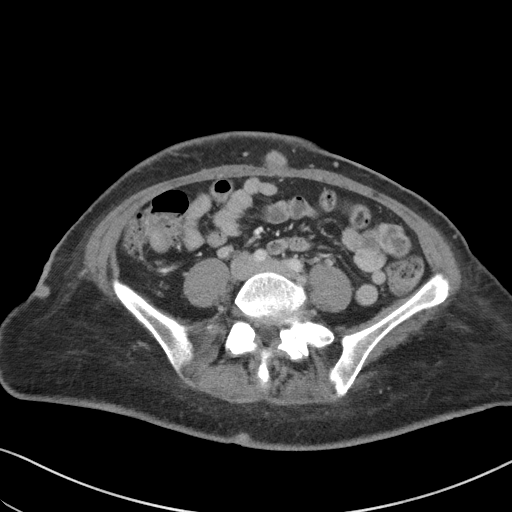
[im 52/93  soft-tissue]
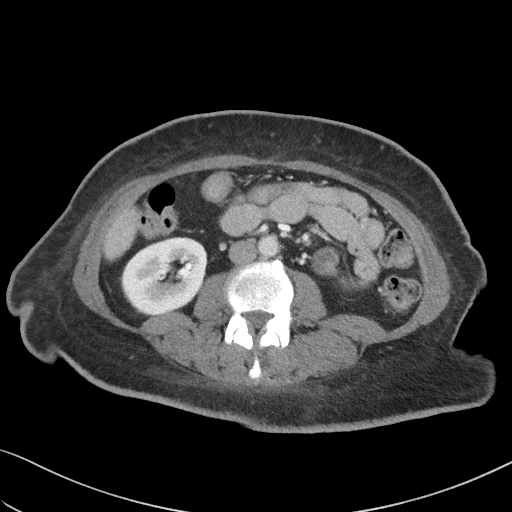
[im 58/93  soft-tissue]
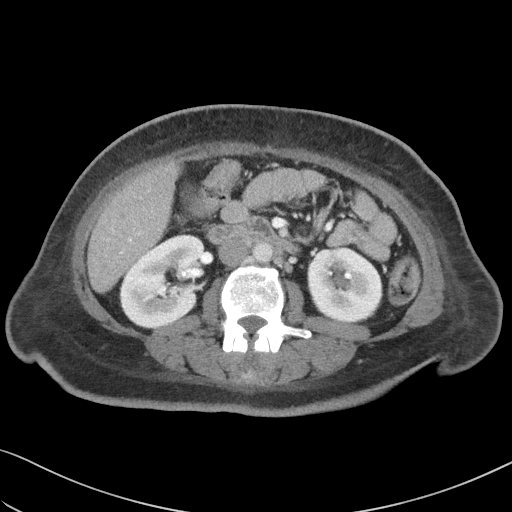
[im 64/93  soft-tissue]
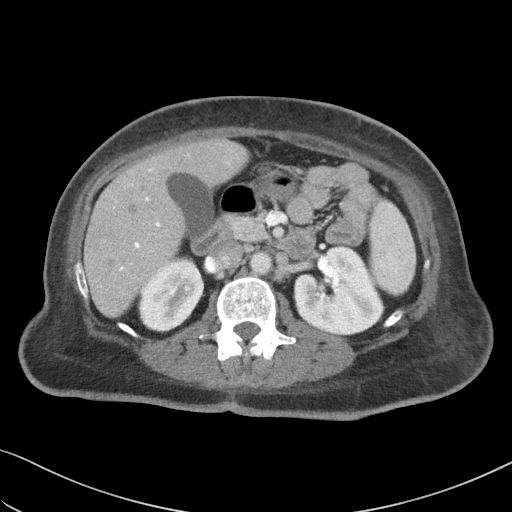
[im 64/93  bone]
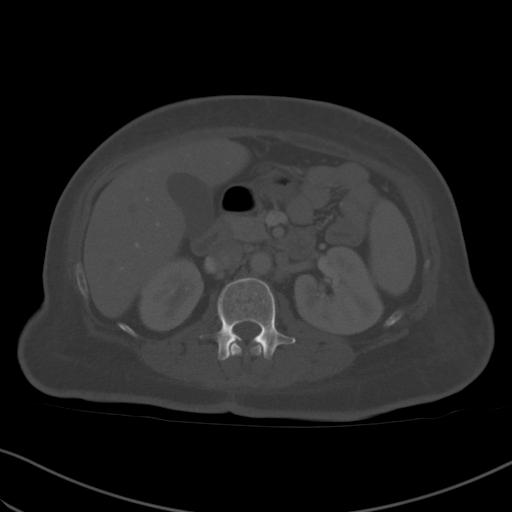
[im 70/93  soft-tissue]
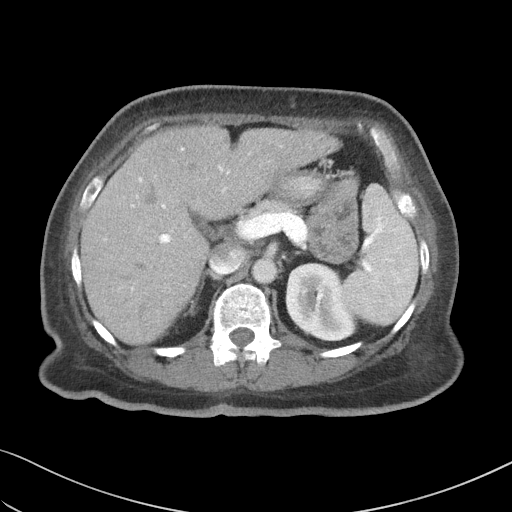
[im 81/93  soft-tissue]
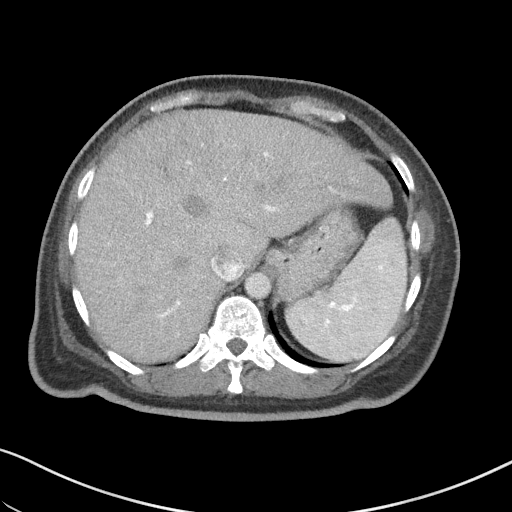
[im 87/93  soft-tissue]
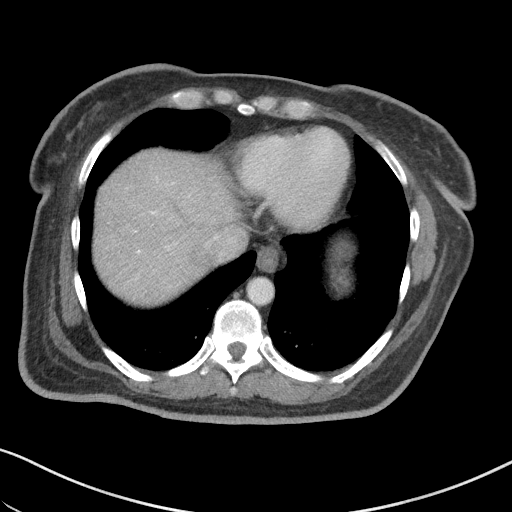

[Series 7: coronal st · coronal · 0.63mm/px · 3 of 79 slices shown]
[im 27/79  soft-tissue]
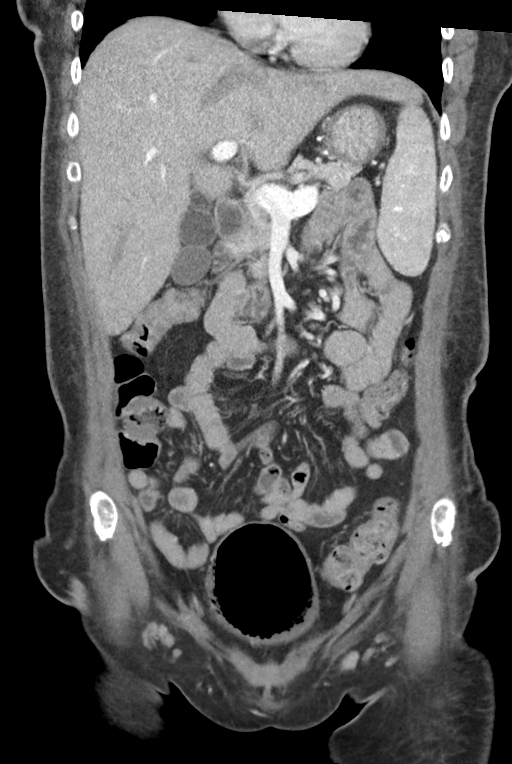
[im 35/79  soft-tissue]
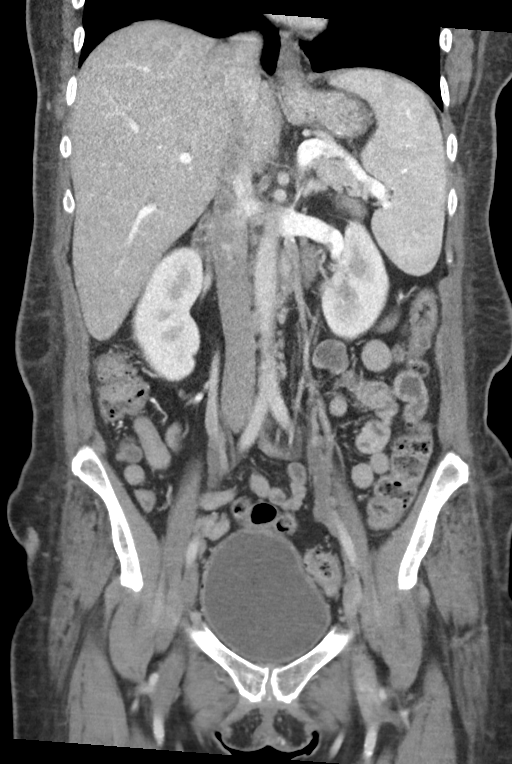
[im 44/79  soft-tissue]
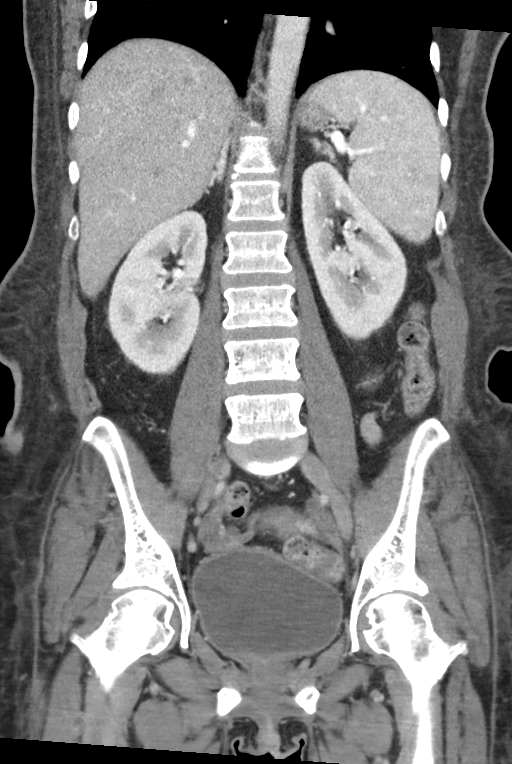

[15 of 46 positions shown; findings below may reference images not displayed]

FINDINGS: Lower chest: No acute abnormality.

Hepatobiliary: The liver is unremarkable. The gallbladder contains
some small dependent calcified calculi. No evidence of gallbladder
distention, wall thickening or biliary ductal dilatation.

Pancreas: Unremarkable. No pancreatic ductal dilatation or
surrounding inflammatory changes.

Spleen: Normal in size without focal abnormality.

Adrenals/Urinary Tract: Adrenal glands are unremarkable. Kidneys are
normal, without renal calculi, focal lesion, or hydronephrosis. The
bladder contains an anterior air-fluid level as well as evidence of
air within the anterior and right anterior wall. Findings are
consistent with emphysematous cystitis. No evidence to suggest focal
abscess or bladder rupture..

Stomach/Bowel: Bowel shows no evidence of obstruction, ileus or
inflammation. No lesions identified. The appendix is normal and
measures 6 mm.

Vascular/Lymphatic: No significant vascular findings are present. No
enlarged abdominal or pelvic lymph nodes.

Reproductive: There is irregular air throughout the vaginal cavity.
This air is in a configuration that is not typical of a tampon and
correlation is suggested clinically and by exam with the possibility
of vaginosis.

Other: No abdominal wall hernia or abnormality. No focal abscess or
ascites.

Musculoskeletal: No acute or significant osseous findings.
IMPRESSION: 1. Air-fluid level in the bladder as well as air in the bladder wall
itself along the anterior and right anterior bladder wall. Findings
are consistent with emphysematous cystitis.
2. Irregular air throughout much of the vaginal cavity suspicious
for vaginosis. Correlation suggested clinically.

## 2018-08-26 IMAGING — CR DG CHEST 1V PORT
1 series · 1 of 1 positions shown · non-contrast
Comparison: [DATE]

CLINICAL DATA: Epigastric pain

EXAM:
PORTABLE CHEST 1 VIEW

[portable]
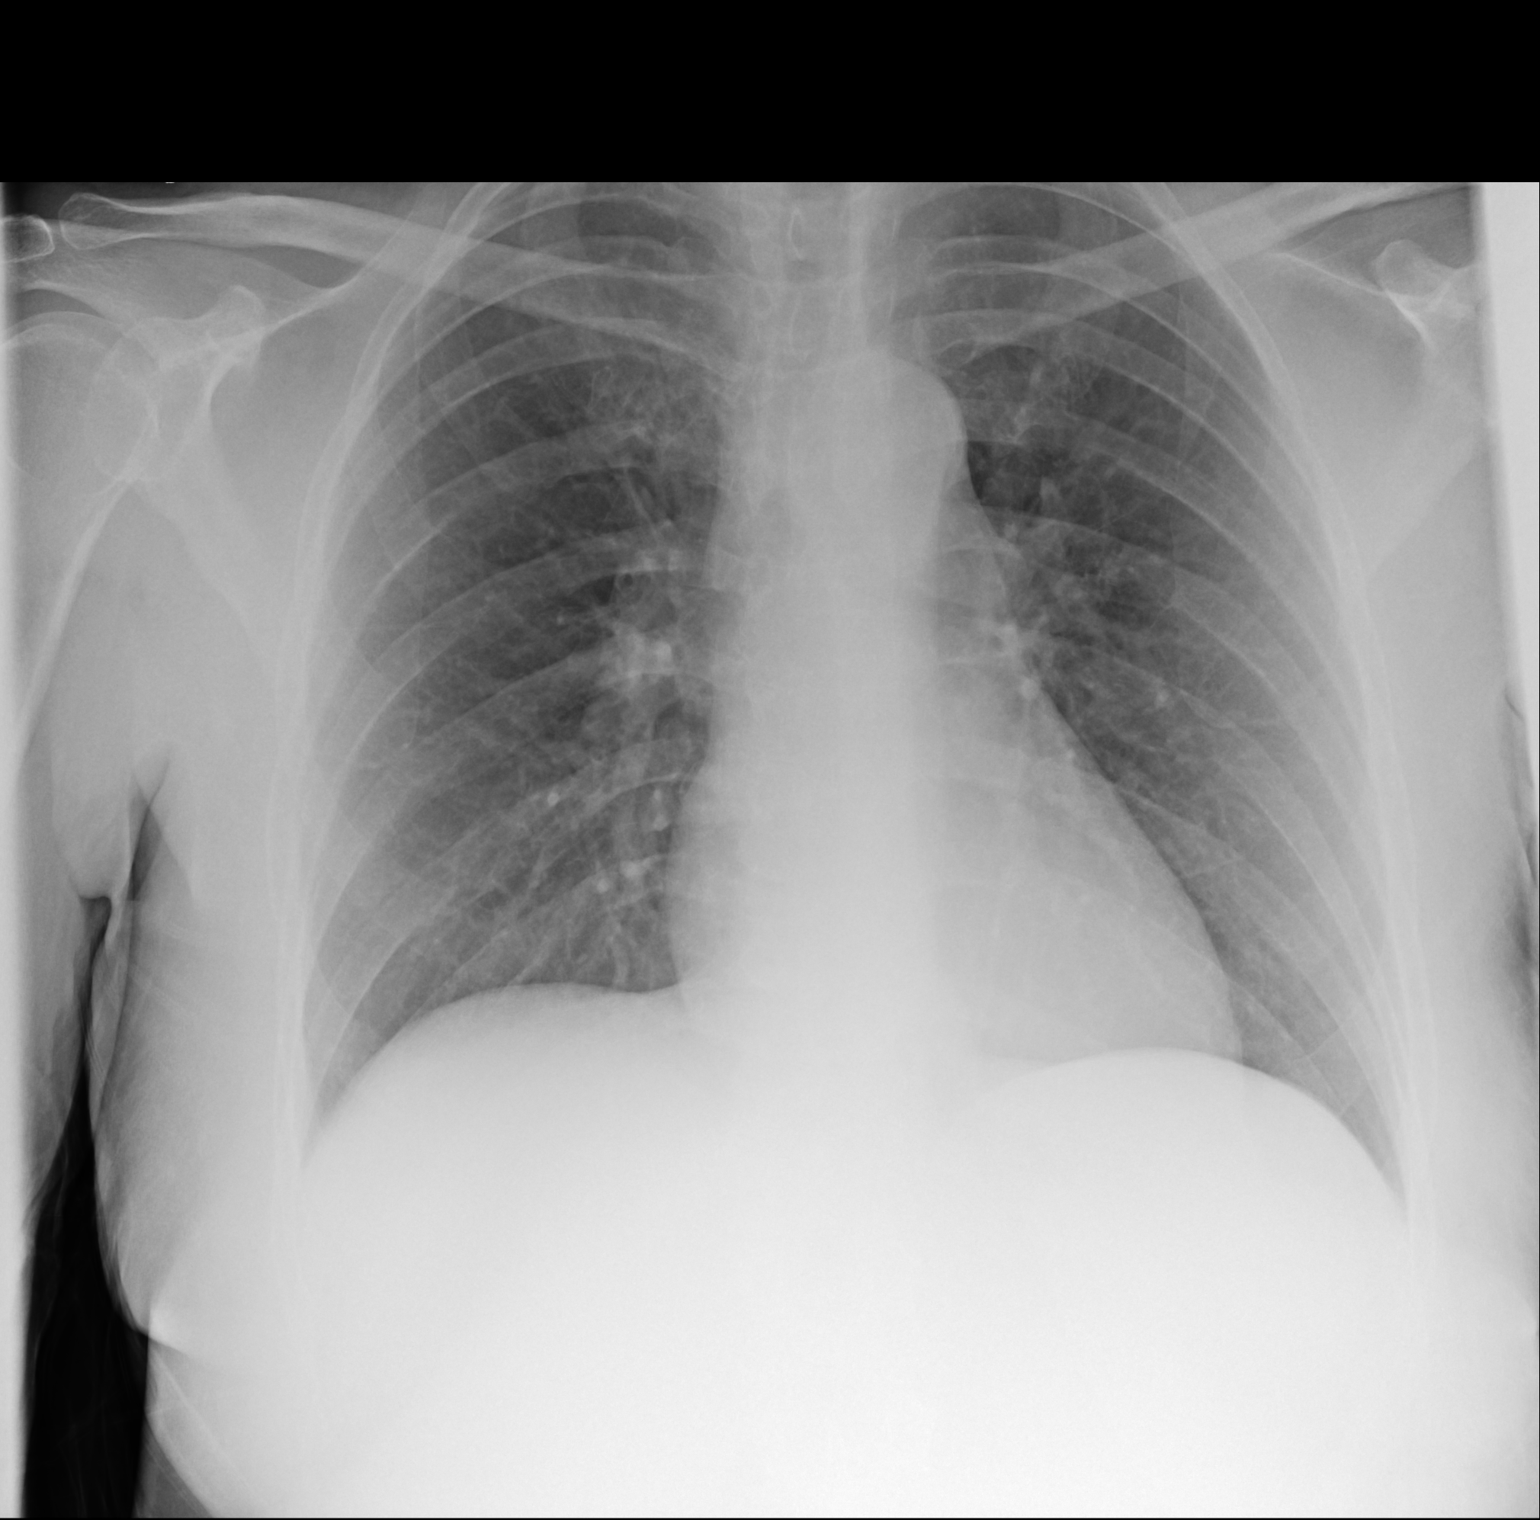

[1 of 1 positions shown; findings below may reference images not displayed]

FINDINGS: The heart size and mediastinal contours are within normal limits.
Both lungs are clear. The visualized skeletal structures are
unremarkable.
IMPRESSION: No active disease.

## 2018-08-26 IMAGING — DX DG KNEE COMPLETE 4+V*L*
4 series · 4 of 4 positions shown · non-contrast
Comparison: [DATE]

CLINICAL DATA: Fell from standing position

EXAM:
LEFT KNEE - COMPLETE 4+ VIEW

[knee ap (1 of 3)]
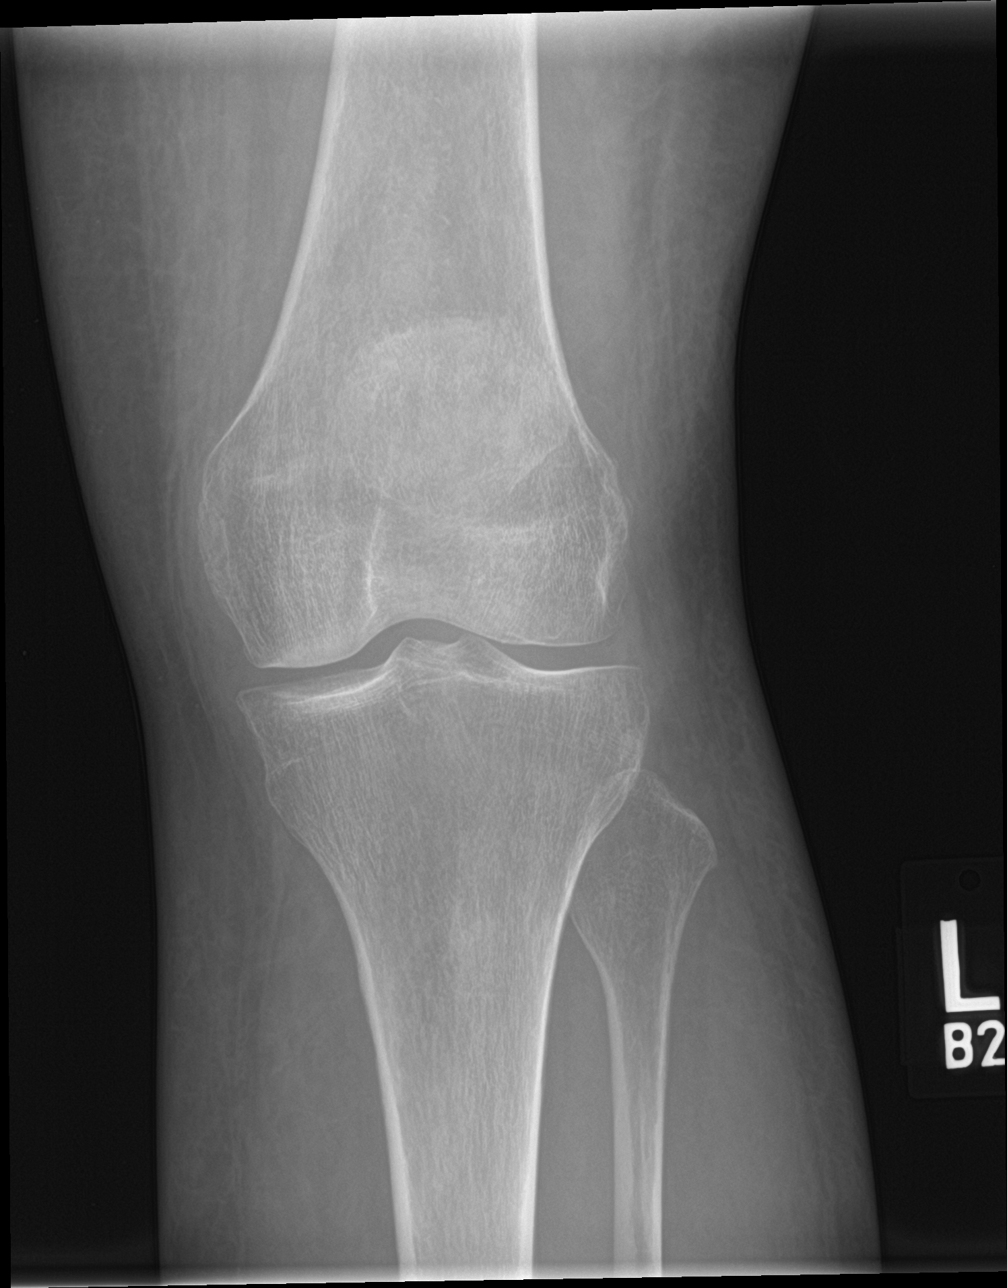

[knee ap (2 of 3)]
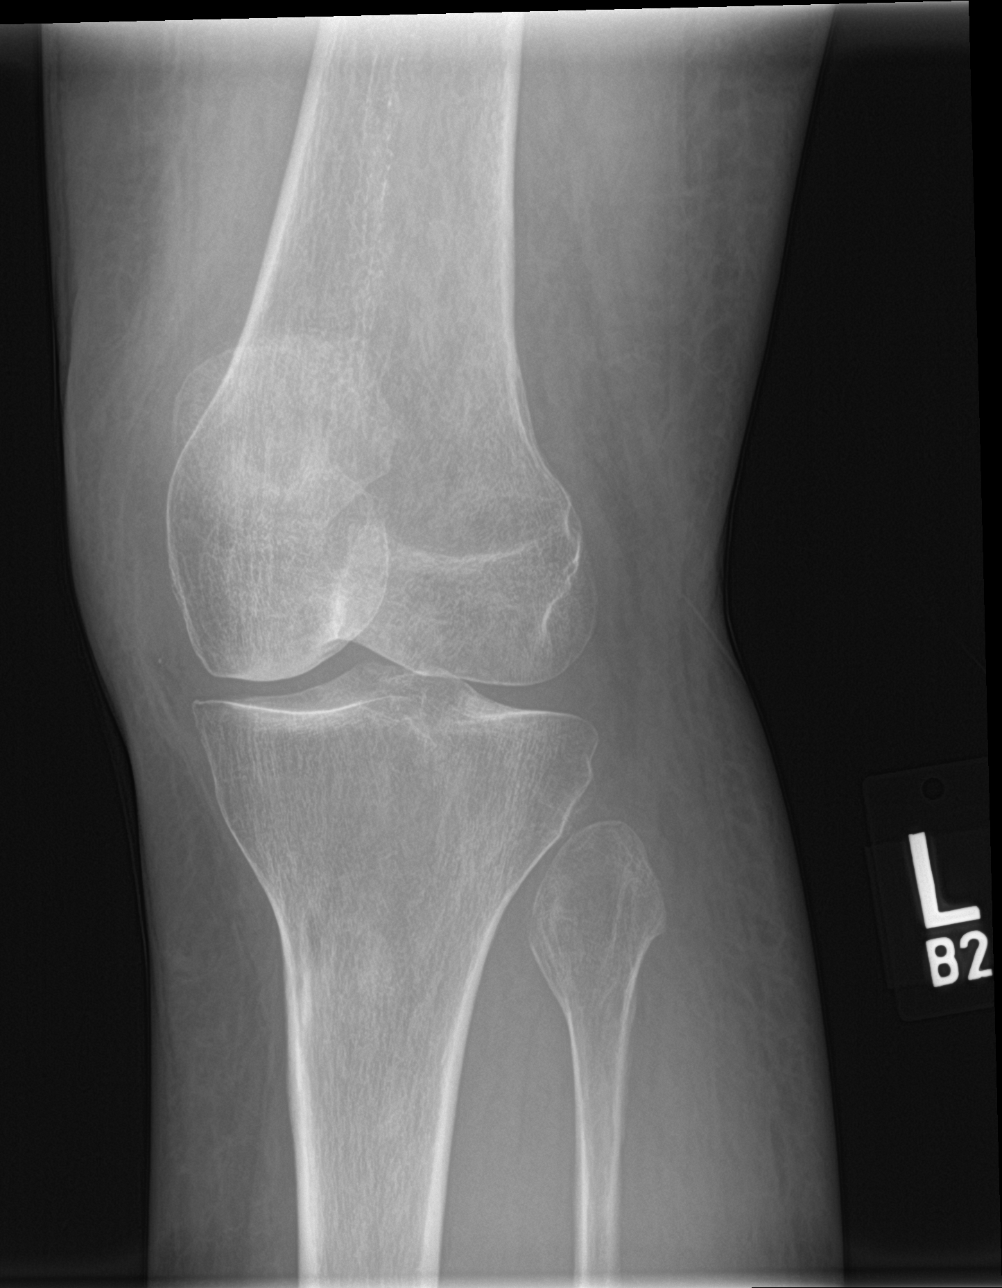

[knee ap (3 of 3)]
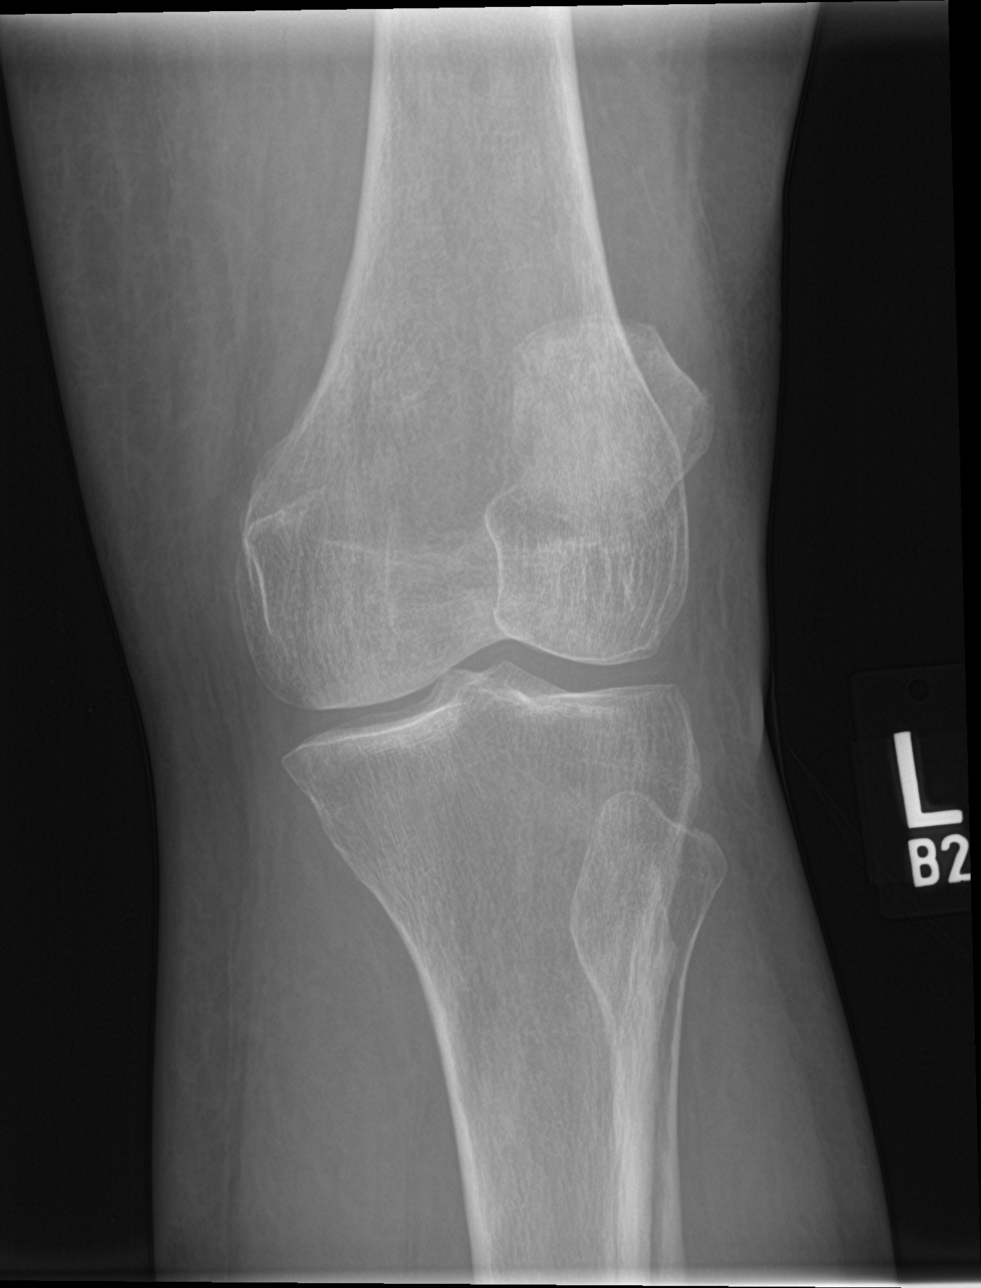

[knee lat]
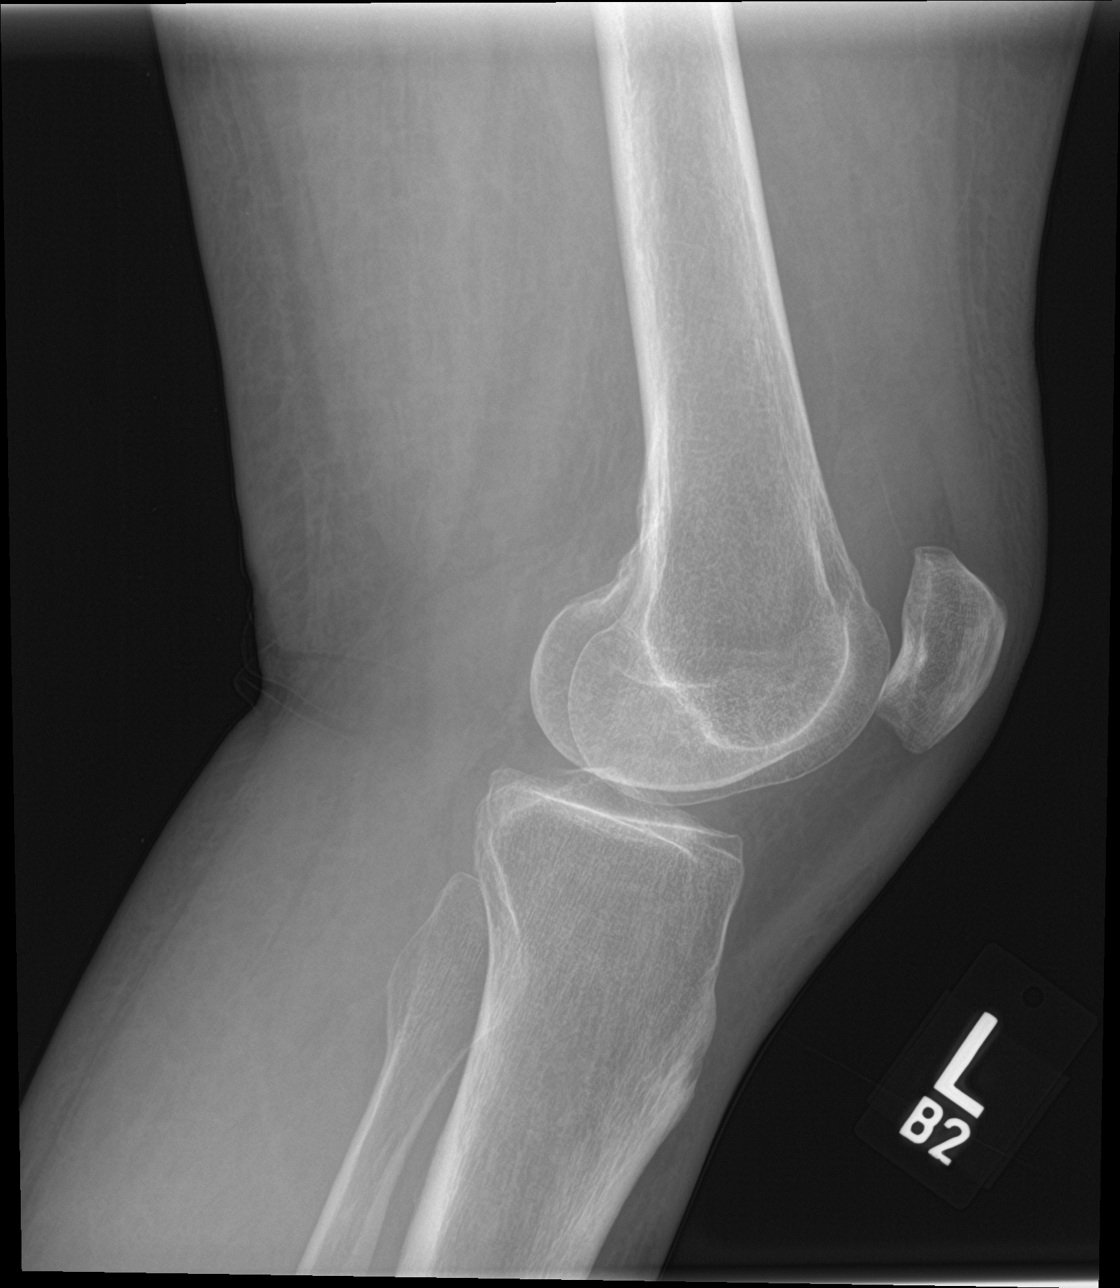

[4 of 4 positions shown; findings below may reference images not displayed]

FINDINGS: No fracture or malalignment. Mild patellofemoral degenerative
change. Small moderate knee effusion. Mild medial joint space
degenerative changes.
IMPRESSION: 1. No acute osseous abnormality.
2. Small moderate knee effusion.

## 2018-08-26 IMAGING — DX DG ANKLE COMPLETE 3+V*L*
3 series · 3 of 3 positions shown · non-contrast
Comparison: Left ankle x-rays dated [DATE].

CLINICAL DATA: Lateral ankle pain after fall yesterday.

EXAM:
LEFT ANKLE COMPLETE - 3+ VIEW

[ankle ap]
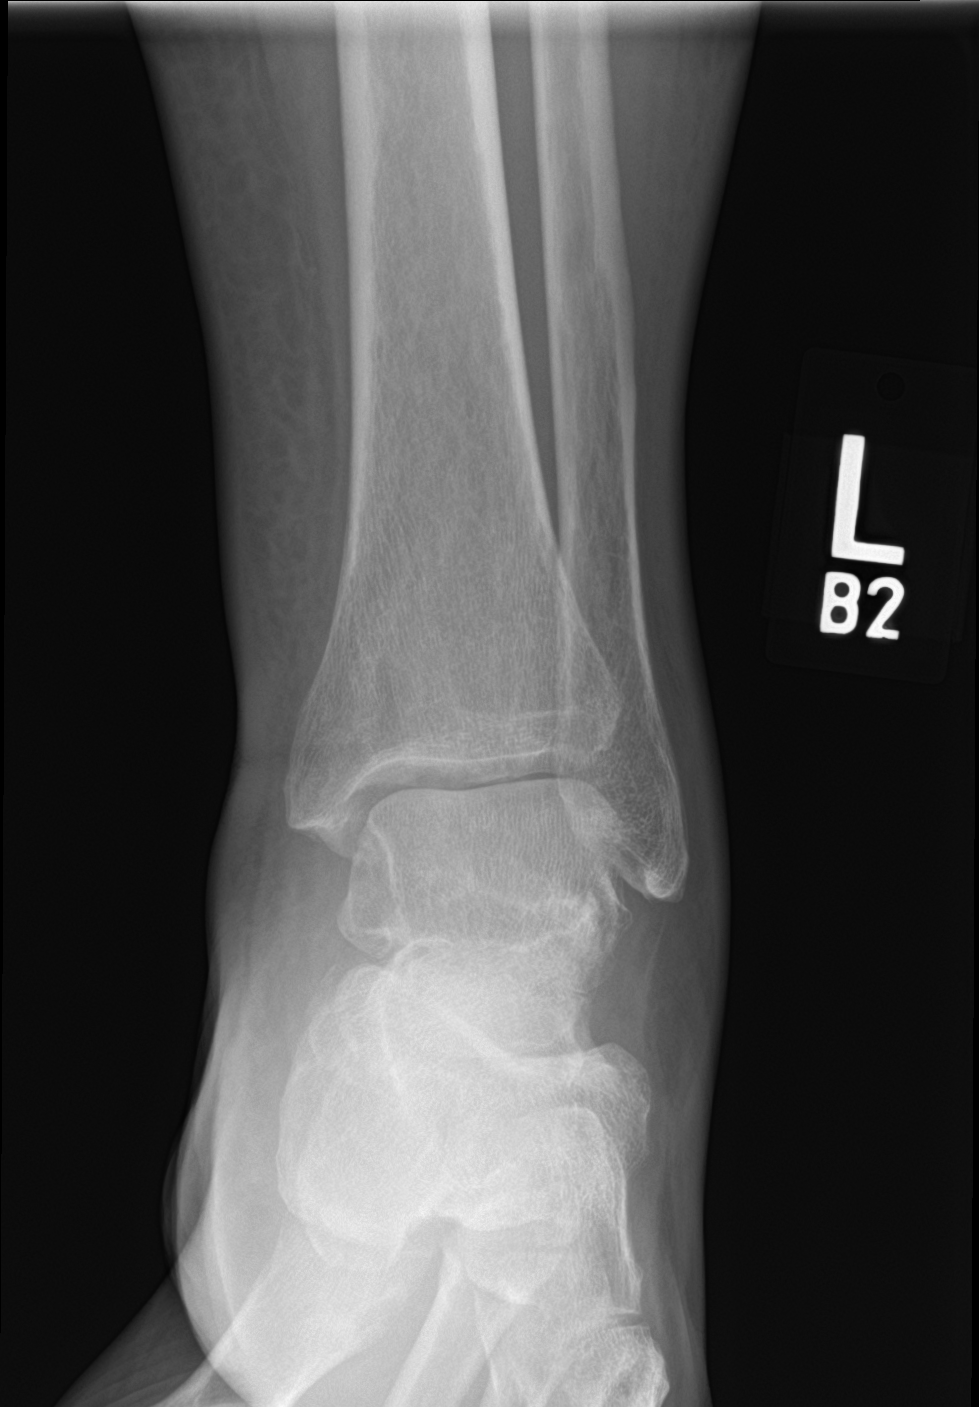

[ankle obl]
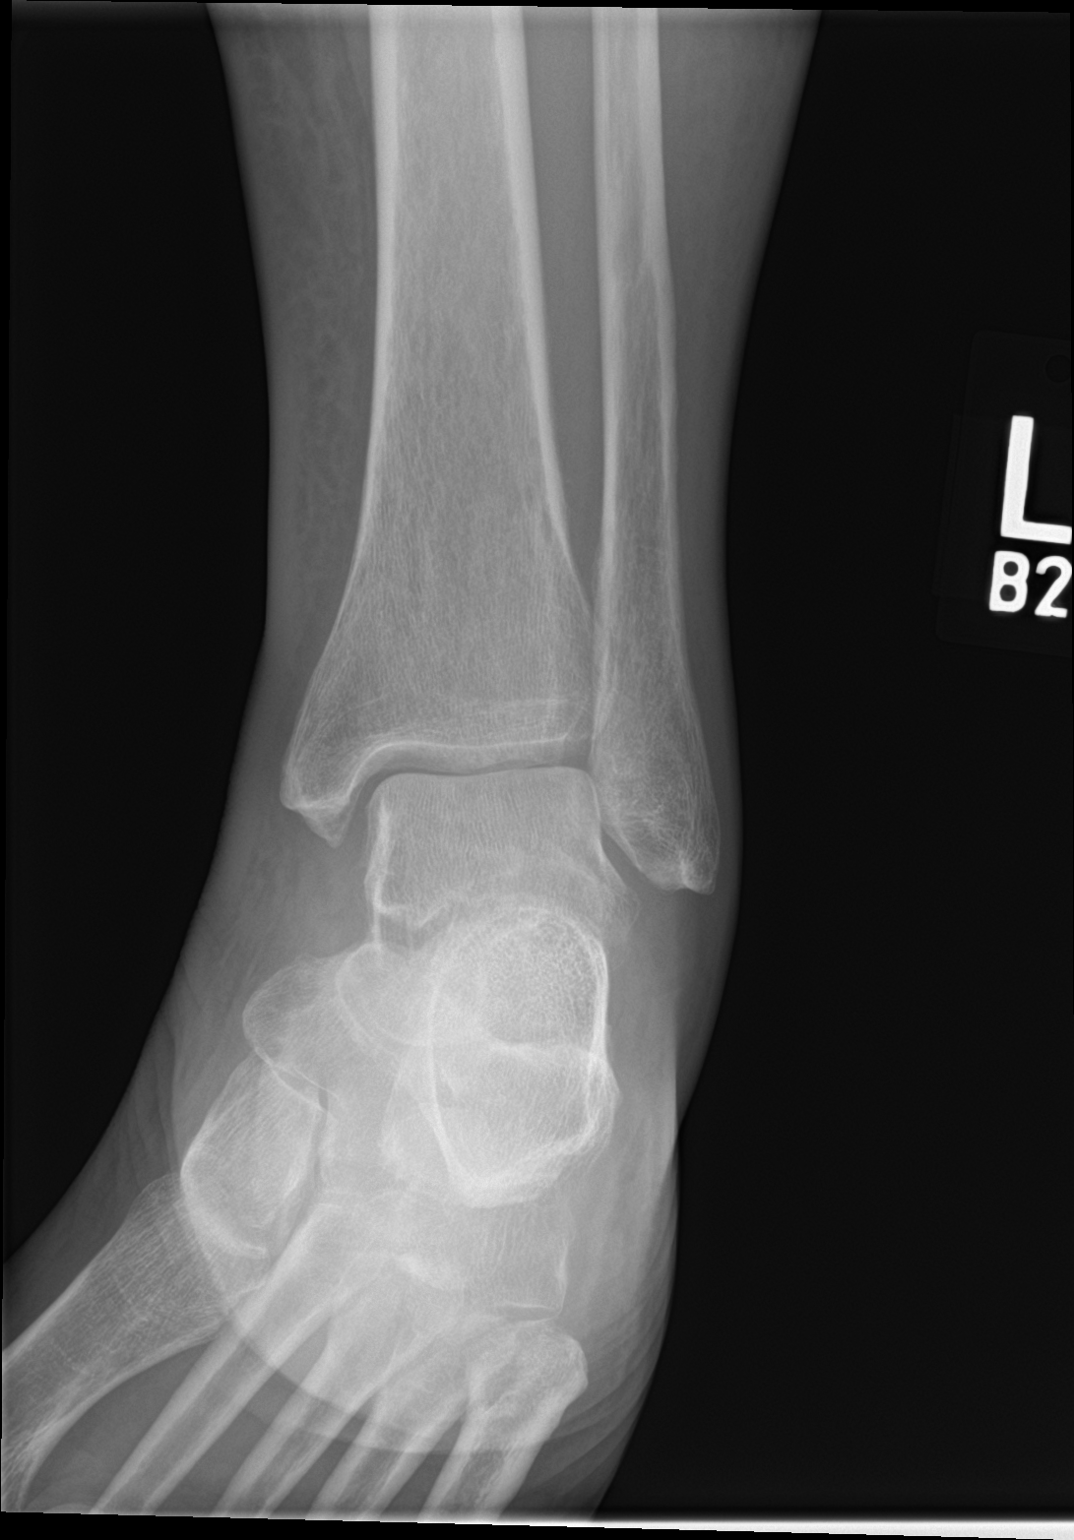

[ankle lat]
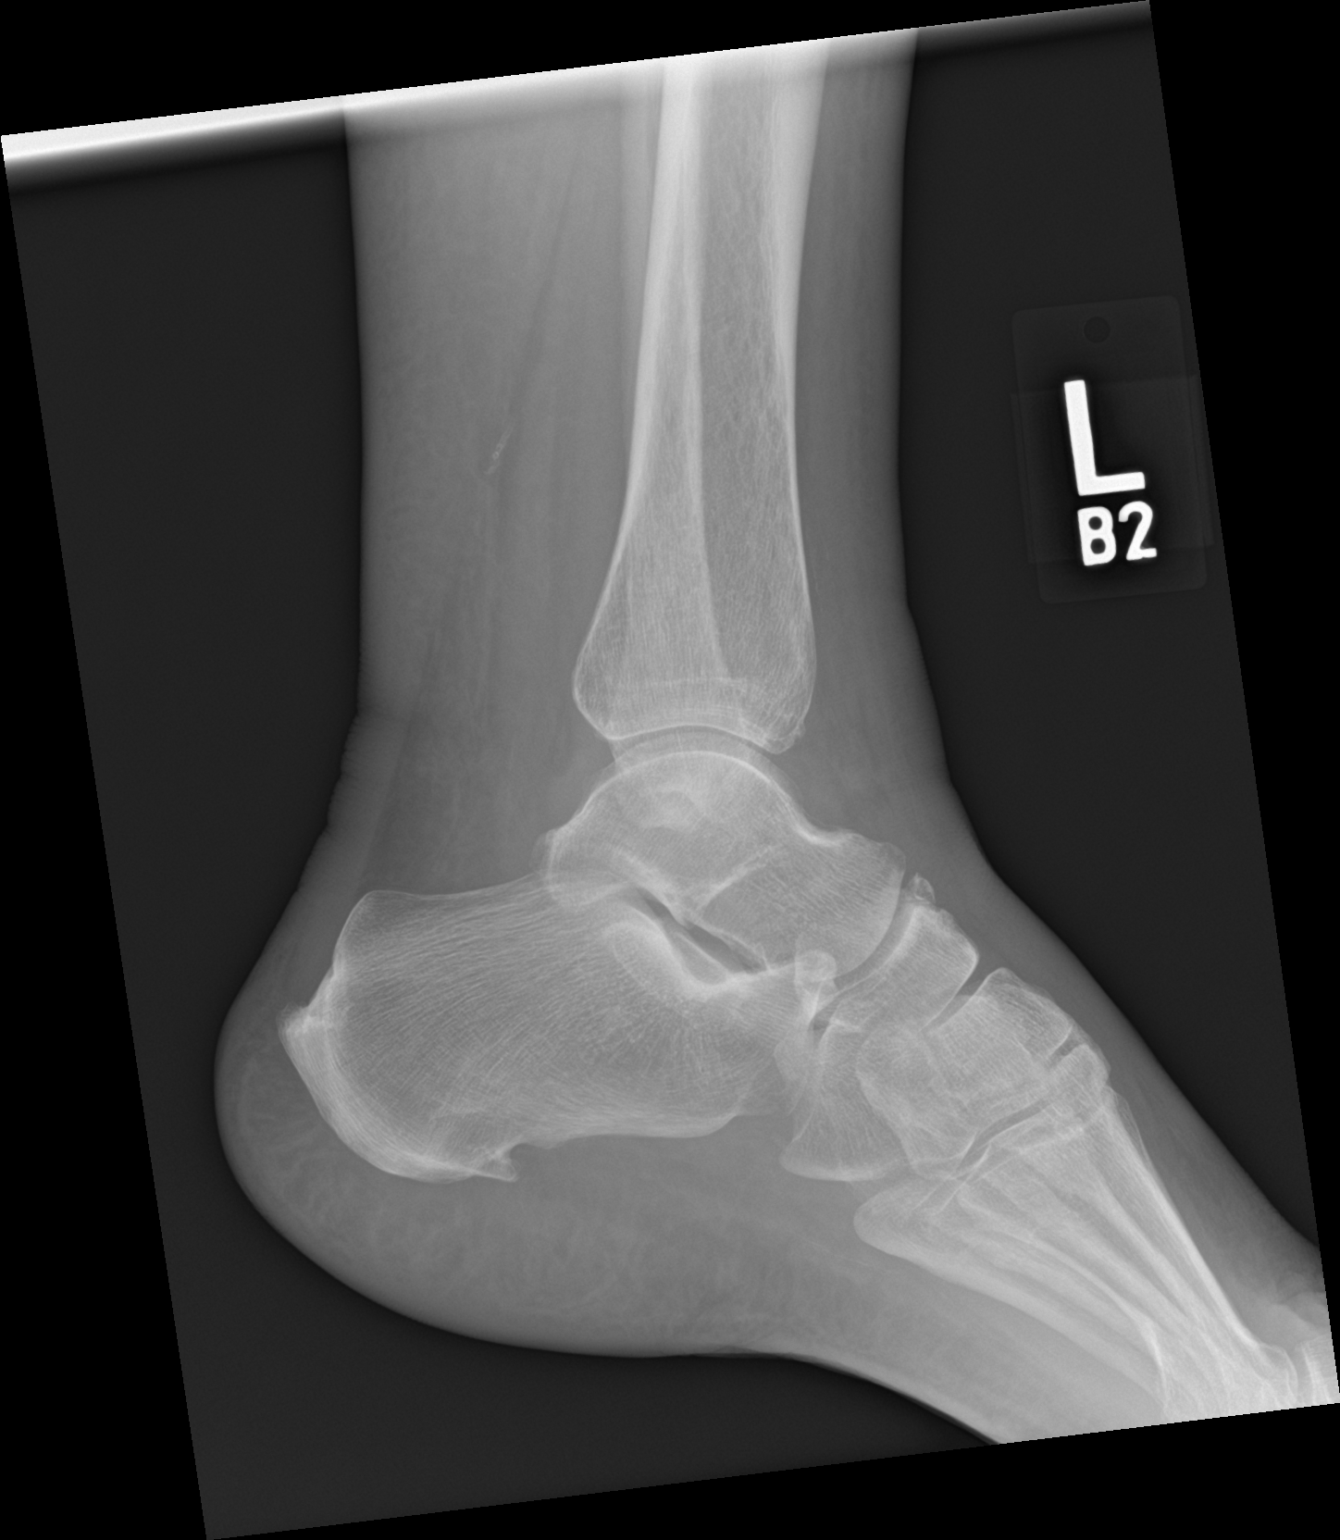

[3 of 3 positions shown; findings below may reference images not displayed]

FINDINGS: No acute fracture or dislocation. The ankle mortise is symmetric.
The talar dome is intact. No tibiotalar joint effusion. Joint spaces
are preserved. Unchanged small plantar and Achilles enthesophytes.
Osteopenia. Mild diffuse soft tissue swelling.
IMPRESSION: 1.  No acute osseous abnormality.

## 2018-08-26 MED ORDER — SODIUM CHLORIDE 0.9% FLUSH
3.0000 mL | Freq: Two times a day (BID) | INTRAVENOUS | Status: DC
  Administered 2018-08-28: 3 mL via INTRAVENOUS

## 2018-08-26 MED ORDER — SODIUM CHLORIDE 0.9 % IV SOLN
1000.0000 mL | INTRAVENOUS | Status: DC
  Administered 2018-08-26: 1000 mL via INTRAVENOUS

## 2018-08-26 MED ORDER — HYDROMORPHONE HCL 1 MG/ML IJ SOLN
0.5000 mg | Freq: Once | INTRAMUSCULAR | Status: AC
  Administered 2018-08-26: 0.5 mg via INTRAVENOUS
  Filled 2018-08-26: qty 1

## 2018-08-26 MED ORDER — SODIUM CHLORIDE 0.9 % IV BOLUS (SEPSIS)
1000.0000 mL | Freq: Once | INTRAVENOUS | Status: AC
  Administered 2018-08-26: 1000 mL via INTRAVENOUS

## 2018-08-26 MED ORDER — ONDANSETRON HCL 4 MG/2ML IJ SOLN
4.0000 mg | Freq: Four times a day (QID) | INTRAMUSCULAR | Status: DC | PRN
  Administered 2018-08-27 – 2018-08-28 (×5): 4 mg via INTRAVENOUS
  Filled 2018-08-26 (×5): qty 2

## 2018-08-26 MED ORDER — ACETAMINOPHEN 650 MG RE SUPP: 650.0000 mg | Freq: Four times a day (QID) | RECTAL | Status: DC | PRN

## 2018-08-26 MED ORDER — ONDANSETRON HCL 4 MG/2ML IJ SOLN
4.0000 mg | Freq: Once | INTRAMUSCULAR | Status: AC
  Administered 2018-08-26: 4 mg via INTRAVENOUS
  Filled 2018-08-26: qty 2

## 2018-08-26 MED ORDER — POTASSIUM CHLORIDE IN NACL 20-0.9 MEQ/L-% IV SOLN
INTRAVENOUS | Status: AC
  Administered 2018-08-27: via INTRAVENOUS
  Filled 2018-08-26 (×2): qty 1000

## 2018-08-26 MED ORDER — SODIUM CHLORIDE 0.9 % IV SOLN
1.0000 g | Freq: Once | INTRAVENOUS | Status: AC
  Administered 2018-08-26: 1 g via INTRAVENOUS
  Filled 2018-08-26: qty 10

## 2018-08-26 MED ORDER — HYDROMORPHONE HCL 2 MG PO TABS
3.0000 mg | ORAL_TABLET | ORAL | Status: DC | PRN
  Administered 2018-08-27: 3 mg via ORAL
  Filled 2018-08-26: qty 2

## 2018-08-26 MED ORDER — SODIUM CHLORIDE 0.9 % IV SOLN
1.0000 g | INTRAVENOUS | Status: DC
  Administered 2018-08-27: 1 g via INTRAVENOUS
  Filled 2018-08-26: qty 1
  Filled 2018-08-26: qty 10

## 2018-08-26 MED ORDER — TETANUS-DIPHTH-ACELL PERTUSSIS 5-2.5-18.5 LF-MCG/0.5 IM SUSP
0.5000 mL | Freq: Once | INTRAMUSCULAR | Status: AC
  Administered 2018-08-26: 0.5 mL via INTRAMUSCULAR
  Filled 2018-08-26: qty 0.5

## 2018-08-26 MED ORDER — METRONIDAZOLE 500 MG PO TABS
500.0000 mg | ORAL_TABLET | Freq: Two times a day (BID) | ORAL | Status: DC
  Administered 2018-08-26 – 2018-08-28 (×4): 500 mg via ORAL
  Filled 2018-08-26 (×4): qty 1

## 2018-08-26 MED ORDER — SENNOSIDES-DOCUSATE SODIUM 8.6-50 MG PO TABS
1.0000 | ORAL_TABLET | Freq: Every evening | ORAL | Status: DC | PRN
  Filled 2018-08-26: qty 1

## 2018-08-26 MED ORDER — HYDROCODONE-ACETAMINOPHEN 5-325 MG PO TABS
1.0000 | ORAL_TABLET | ORAL | Status: DC | PRN
  Administered 2018-08-27 – 2018-08-28 (×4): 2 via ORAL
  Filled 2018-08-26 (×4): qty 2

## 2018-08-26 MED ORDER — MAGNESIUM SULFATE IN D5W 1-5 GM/100ML-% IV SOLN
1.0000 g | Freq: Once | INTRAVENOUS | Status: AC
  Administered 2018-08-27: 1 g via INTRAVENOUS
  Filled 2018-08-26: qty 100

## 2018-08-26 MED ORDER — INSULIN ASPART 100 UNIT/ML ~~LOC~~ SOLN
0.0000 [IU] | Freq: Every day | SUBCUTANEOUS | Status: DC
  Administered 2018-08-27: 4 [IU] via SUBCUTANEOUS
  Filled 2018-08-26: qty 1

## 2018-08-26 MED ORDER — ACETAMINOPHEN 325 MG PO TABS: 650.0000 mg | ORAL_TABLET | Freq: Four times a day (QID) | ORAL | Status: DC | PRN

## 2018-08-26 MED ORDER — ONDANSETRON HCL 4 MG PO TABS: 4.0000 mg | ORAL_TABLET | Freq: Four times a day (QID) | ORAL | Status: DC | PRN

## 2018-08-26 MED ORDER — TRAMADOL HCL 50 MG PO TABS
100.0000 mg | ORAL_TABLET | Freq: Once | ORAL | Status: AC
  Administered 2018-08-26: 100 mg via ORAL
  Filled 2018-08-26: qty 2

## 2018-08-26 MED ORDER — ONDANSETRON HCL 4 MG PO TABS
4.0000 mg | ORAL_TABLET | Freq: Once | ORAL | Status: AC
  Administered 2018-08-26: 4 mg via ORAL
  Filled 2018-08-26: qty 1

## 2018-08-26 MED ORDER — ENOXAPARIN SODIUM 40 MG/0.4ML ~~LOC~~ SOLN
40.0000 mg | SUBCUTANEOUS | Status: DC
  Administered 2018-08-27 (×2): 40 mg via SUBCUTANEOUS
  Filled 2018-08-26 (×2): qty 0.4

## 2018-08-26 MED ORDER — IOHEXOL 300 MG/ML  SOLN
100.0000 mL | Freq: Once | INTRAMUSCULAR | Status: AC | PRN
  Administered 2018-08-26: 100 mL via INTRAVENOUS

## 2018-08-26 MED ORDER — INSULIN ASPART 100 UNIT/ML ~~LOC~~ SOLN
0.0000 [IU] | Freq: Three times a day (TID) | SUBCUTANEOUS | Status: DC
  Administered 2018-08-27: 3 [IU] via SUBCUTANEOUS
  Administered 2018-08-27: 2 [IU] via SUBCUTANEOUS
  Administered 2018-08-27: 5 [IU] via SUBCUTANEOUS
  Filled 2018-08-26 (×2): qty 1

## 2018-08-26 MED ORDER — POTASSIUM CHLORIDE CRYS ER 20 MEQ PO TBCR
40.0000 meq | EXTENDED_RELEASE_TABLET | Freq: Once | ORAL | Status: AC
  Administered 2018-08-26: 40 meq via ORAL
  Filled 2018-08-26: qty 2

## 2018-08-26 NOTE — ED Notes (Signed)
Attempted to assist in pelvic exam, pt crying and states she is going to vomit while laying flat. Pt assisted back up in bed with HOB up.  Dr. Gilford Raid in room to assess pt as well.

## 2018-08-26 NOTE — ED Triage Notes (Signed)
Patient states she fell yesterday hurting her left ankle and knee. Denies LoC or hitting her head. Swelling to left ankle in triage.

## 2018-08-26 NOTE — ED Notes (Signed)
Patient transported to X-ray 

## 2018-08-26 NOTE — ED Provider Notes (Addendum)
[facility] EMERGENCY DEPARTMENT Provider Note   CSN: [id number] Arrival date & time: 08/26/18  1522    History   Chief Complaint Chief Complaint  Patient presents with  . Fall    HPI Melanie Hall is a 48 y.o. female.     Patient is a 48 year old female who presents to the emergency department with a complaint of left ankle and knee pain.  The patient states that she was coming out of a store when she fell and injured the left ankle and left knee on February 17.  The patient denies hitting or injuring any other areas.  She specifically denies hitting her head.  The patient denies being on any anticoagulation medications.  She says she came to the emergency department because she is having swelling of the ankle and increasing pain of the knee.  She has had a problem with fracture of the right ankle in the past and was concerned that she may have another fracture.  The history is provided by the patient.    Past Medical History:  Diagnosis Date  . Diabetes mellitus     Patient Active Problem List   Diagnosis Date Noted  . Closed fracture of right ankle 11/06/2017  . IRREGULAR MENSES 09/14/2009  . BURN, FIRST DEGREE, ARM 09/14/2009  . HYPERCHOLESTEROLEMIA 11/19/2008  . PARONYCHIA, RIGHT GREAT TOE 07/30/2008  . ABSCESS, TOOTH 03/31/2008  . DIABETES MELLITUS, TYPE II 07/02/2007  . CATARACTS, BILATERAL 07/02/2007    Past Surgical History:  Procedure Laterality Date  . KNEE SURGERY    . TUBAL LIGATION       OB History   No obstetric history on file.      Home Medications    Prior to Admission medications   Medication Sig Start Date End Date Taking? Authorizing Provider  HYDROcodone-acetaminophen (NORCO) 5-325 MG tablet Take 1 tablet by mouth every 6 (six) hours as needed for moderate pain. 11/06/17   Carole Civil, MD    Family History Family History  Problem Relation Age of Onset  . Diabetes Mother   . Hypertension Mother   . Bipolar disorder  Mother   . Brain cancer Maternal Aunt   . Heart disease Maternal Grandmother   . Diabetes Maternal Grandmother   . Breast cancer Maternal Aunt     Social History Social History   Tobacco Use  . Smoking status: Never Smoker  . Smokeless tobacco: Never Used  Substance Use Topics  . Alcohol use: No  . Drug use: No     Allergies   Patient has no known allergies.   Review of Systems Review of Systems  Constitutional: Negative for activity change.       All ROS Neg except as noted in HPI  HENT: Negative for nosebleeds.   Eyes: Negative for photophobia and discharge.  Respiratory: Negative for cough, shortness of breath and wheezing.   Cardiovascular: Negative for chest pain and palpitations.  Gastrointestinal: Negative for abdominal pain and blood in stool.  Genitourinary: Negative for dysuria, frequency and hematuria.  Musculoskeletal: Positive for arthralgias. Negative for back pain and neck pain.       Knee and ankle pain  Skin: Negative.   Neurological: Negative for dizziness, seizures and speech difficulty.  Psychiatric/Behavioral: Negative for confusion and hallucinations.     Physical Exam Updated Vital Signs BP 135/75 (BP Location: Right Arm)   Pulse (!) 106   Temp 99 F (37.2 C) (Oral)   Resp 16   Ht 5'  2" (1.575 m)   Wt 61.2 kg   LMP 12/17/2016   SpO2 100%   BMI 24.69 kg/m   Physical Exam Vitals signs and nursing note reviewed.  Constitutional:      Appearance: She is well-developed. She is not toxic-appearing.  HENT:     Head: Normocephalic.     Right Ear: Tympanic membrane and external ear normal.     Left Ear: Tympanic membrane and external ear normal.  Eyes:     General: Lids are normal.     Pupils: Pupils are equal, round, and reactive to light.  Neck:     Musculoskeletal: Normal range of motion and neck supple.     Vascular: No carotid bruit.  Cardiovascular:     Rate and Rhythm: Normal rate and regular rhythm.     Pulses: Normal  pulses.     Heart sounds: Normal heart sounds.  Pulmonary:     Effort: No respiratory distress.     Breath sounds: Normal breath sounds.  Abdominal:     General: Bowel sounds are normal.     Palpations: Abdomen is soft.     Tenderness: There is no abdominal tenderness. There is no guarding.  Musculoskeletal: Normal range of motion.     Comments: There is no pain with movement of the pelvis.  There is full range of motion of the left hip.  There is no deformity of the left knee.  There is an abrasion of the anterior left knee.  There is no posterior mass of the left knee, the patella is in the midline.  There is no deformity of the left lower leg.  There is some swelling of the lateral malleolus.  The Achilles tendon is intact.  There is full range of motion of the toes.  The dorsalis pedis pulse in the posterior tibial pulses are 2+.  Capillary refill is less than 2 seconds.  Lymphadenopathy:     Head:     Right side of head: No submandibular adenopathy.     Left side of head: No submandibular adenopathy.     Cervical: No cervical adenopathy.  Skin:    General: Skin is warm and dry.  Neurological:     Mental Status: She is alert and oriented to person, place, and time.     Cranial Nerves: No cranial nerve deficit.     Sensory: No sensory deficit.  Psychiatric:        Speech: Speech normal.      ED Treatments / Results  Labs (all labs ordered are listed, but only abnormal results are displayed) Labs Reviewed - No data to display  EKG None  Radiology No results found.  Procedures Procedures (including critical care time) CRITICAL CARE Performed by: Lily Kocher Total critical care time: **35* minutes Critical care time was exclusive of separately billable procedures and treating other patients. Critical care was necessary to treat or prevent imminent or life-threatening deterioration. Critical care was time spent personally by me on the following activities:  development of treatment plan with patient and/or surrogate as well as nursing, discussions with consultants, evaluation of patient's response to treatment, examination of patient, obtaining history from patient or surrogate, ordering and performing treatments and interventions, ordering and review of laboratory studies, ordering and review of radiographic studies, pulse oximetry and re-evaluation of patient's condition. Medications Ordered in ED Medications - No data to display   Initial Impression / Assessment and Plan / ED Course  I have reviewed the triage vital  signs and the nursing notes.  Pertinent labs & imaging results that were available during my care of the patient were reviewed by me and considered in my medical decision making (see chart for details).         Final Clinical Impressions(s) / ED Diagnoses MDM  Heart rate is slightly elevated, vital signs reviewed.  Pulse oximetry is 100% on room air.  Within normal limits by my interpretation.  An ice pack is been provided for the patient.  Medication for pain is also provided, and patient will receive x-rays.   X-ray of the left ankle is negative for fracture or dislocation.  X-ray of the left knee is negative for fracture or dislocation.  There is a question of a very small effusion present.  The patient is fitted with an ankle stirrup splint, as well as a knee sleeve.  She will use her ice packs today and tomorrow.  She is asked to use 600 mg of ibuprofen 4 times daily with each meal and at bedtime.  She is asked to use Tylenol extra strength in between the ibuprofen doses if needed for pain and discomfort.  She is referred to Dr. Aline Brochure for orthopedic evaluation if not improving.   During discharge the patient informed the nurse that she is now having epigastric area pain and right lower quadrant abdomen pain.  She states this started after she got here to the emergency department.  She is not been vomiting since being in  the department.  There is been no temperature change.  The patient did not complain of abdominal pain during the examination, neither did she give this complaint to the triage staff.  Color is good.  Skin is warm and dry.  Patient has pain in the epigastric area.  There is also pain to palpation in the right lower quadrant.  The abdomen is soft with good bowel sounds.  There is no mass, or pulsating mass appreciated.  No CVA tenderness appreciated.  Will obtain blood work, and CT abdomen scan.  Lipase is normal at 24.  The sodium is slightly low at 132.  The potassium is low at 2.9.  Oral potassium has been ordered for the patient.  Glucose is elevated at 420.  The patient will receive IV bolus.  We will recheck the glucose shortly.  The complete blood count is nonacute. Urine analysis shows a cloudy yellow specimen with a specific gravity of 1.028.  There is greater than 500 units of glucose.  There is a small leukocyte esterase, and greater than 50 white blood cells per high-powered field.  There are many bacteria present.  CT scan shows an air-fluid level in the bladder as well as air in the bladder wall itself along the anterior and right anterior bladder wall.  It was felt that these findings were consistent with emphysematous cystitis.  There is irregular area throughout much of the vaginal cavity suspicious for vaginosis.  There was no evidence to suggest focal abscess or bladder rupture.  Attempted to obtain vaginal cultures and wet prep.  The patient was too painful to have this done.  Pt now c/o epigastric/chest pain and back pain. Port chest xray ordered. Patient is given IV pain medication.  IV Rocephin has been started.  Patient seen with me by Dr. Gilford Raid.  I have discussed the findings with the patient in terms of which he understands.  Questions were answered.  Patient states that she has not taken any medication for her diabetes  over several years because she lost a great deal of  weight.  The patient states she does not have a primary physician and has not been evaluated in quite some time.  Will consult with the triad hospitalist for admission. Pt to be admitted.   Final diagnoses:  Emphysematous cystitis  Hyperglycemia  Hypokalemia  Sprain of left ankle, unspecified ligament, initial encounter  Contusion of left knee, initial encounter    ED Discharge Orders    None       Lily Kocher, PA-C 08/26/18 1746    Isla Pence, MD 08/26/18 1846    Lily Kocher, PA-C 08/26/18 2231    Isla Pence, MD 08/26/18 2340    Lily Kocher, PA-C 09/04/18 1313    Isla Pence, MD 09/04/18 1323

## 2018-08-26 NOTE — Discharge Instructions (Addendum)
The x-ray of your left knee is negative for fracture or dislocation.  The x-ray of your left ankle is negative for fracture or dislocation.  Please use your knee sleeve and your ankle splint until your pain has improved.  Use 600mg  of ibuprofen  with each meal and at bedtime.  Use 1000 mg of Tylenol in between the ibuprofen doses if needed for pain and soreness.  Use your ice pack today and tomorrow.  Please see Dr. Aline Brochure for orthopedic evaluation if your knee and ankle are not improving.

## 2018-08-26 NOTE — ED Notes (Signed)
Patient transported to CT 

## 2018-08-26 NOTE — H&P (Signed)
History and Physical    Melanie Hall WNU:[id number] DOB: March 13, 1971 DOA: 08/26/2018  PCP: Patient, No Pcp Per   Patient coming from: Home   Chief Complaint: left knee and ankle pain after a fall   HPI: Melanie Hall is a 48 y.o. female with medical history significant for diabetes mellitus not currently on any medications, now presenting to the emergency department with left knee and ankle pain after a fall, and developed severe lower abdominal pain while in the ED.  Patient reports that she had been in her usual state until she tripped and fell earlier today, did not hit her head or lose consciousness, but has been experiencing severe pain in the left ankle and knee since the fall.  She has been able to bear weight, but with significant pain.  She went on to develop severe pain in the lower abdomen, constant, localized, and without any alleviating or exacerbating factors identified.  She has not noted any fevers or chills and denies any significant dysuria, gross hematuria, or vaginal discharge.  ED Course: Upon arrival to the ED, patient is found to be afebrile, saturating well on room air, slightly tachycardic, and with stable blood pressure.  Chemistry panel is notable for glucose of 420, potassium 2.9, and albumin 2.1.  CBC features a normocytic anemia with hemoglobin 10.6.  Urinalysis is compatible with infection.  Radiographs of the left knee and ankle are notable for a small left knee effusion, but otherwise negative.  CT the abdomen and pelvis is concerning for emphysematous cystitis as well as air throughout much of the vaginal cavity suggestive of vaginosis.  Patient was given IV fluids, Rocephin, Dilaudid, oral potassium, and ankle was immobilized in the ED.  She remains hemodynamically stable and will be admitted for further evaluation and management.  Review of Systems:  All other systems reviewed and apart from HPI, are negative.  Past Medical History:  Diagnosis Date  . Diabetes  mellitus     Past Surgical History:  Procedure Laterality Date  . KNEE SURGERY    . TUBAL LIGATION       reports that she has never smoked. She has never used smokeless tobacco. She reports that she does not drink alcohol or use drugs.  No Known Allergies  Family History  Problem Relation Age of Onset  . Diabetes Mother   . Hypertension Mother   . Bipolar disorder Mother   . Brain cancer Maternal Aunt   . Heart disease Maternal Grandmother   . Diabetes Maternal Grandmother   . Breast cancer Maternal Aunt      Prior to Admission medications   Not on File    Physical Exam: Vitals:   08/26/18 1606 08/26/18 2115 08/26/18 2230 08/26/18 2301  BP: 135/75 133/87 (!) 151/83 (!) 153/89  Pulse: (!) 106 84 81 86  Resp: 16 18    Temp: 99 F (37.2 C) 97.6 F (36.4 C)    TempSrc: Oral Oral    SpO2: 100% 100% 96% 100%  Weight:      Height:        Constitutional: NAD, appears uncomfortable  Eyes: PERTLA, lids and conjunctivae normal ENMT: Mucous membranes are moist. Posterior pharynx clear of any exudate or lesions.   Neck: normal, supple, no masses, no thyromegaly Respiratory: clear to auscultation bilaterally, no wheezing, no crackles. Normal respiratory effort.    Cardiovascular: S1 & S2 heard, regular rate and rhythm. No extremity edema.   Abdomen: No distension, soft, tender in lower  abdomen, no rebound pain or guarding. Bowel sounds normal.  Musculoskeletal: no clubbing / cyanosis. No joint deformity upper and lower extremities.   Skin: no significant rashes, lesions, ulcers. Warm, dry, well-perfused. Neurologic: CN 2-12 grossly intact. Sensation intact. Strength 5/5 in all 4 limbs.  Psychiatric:  Alert and oriented x 3. Calm, cooperative.    Labs on Admission: I have personally reviewed following labs and imaging studies  CBC: Recent Labs  Lab 08/26/18 1825  WBC 10.3  NEUTROABS 6.7  HGB 10.6*  HCT 30.3*  MCV 87.6  PLT 076   Basic Metabolic Panel: Recent  Labs  Lab 08/26/18 1825  NA 132*  K 2.9*  CL 102  CO2 22  GLUCOSE 420*  BUN 13  CREATININE 0.98  CALCIUM 7.8*   GFR: Estimated Creatinine Clearance: 60.4 mL/min (by C-G formula based on SCr of 0.98 mg/dL). Liver Function Tests: Recent Labs  Lab 08/26/18 1825  AST 16  ALT 11  ALKPHOS 87  BILITOT 0.9  PROT 5.6*  ALBUMIN 2.1*   Recent Labs  Lab 08/26/18 1825  LIPASE 24   No results for input(s): AMMONIA in the last 168 hours. Coagulation Profile: No results for input(s): INR, PROTIME in the last 168 hours. Cardiac Enzymes: No results for input(s): CKTOTAL, CKMB, CKMBINDEX, TROPONINI in the last 168 hours. BNP (last 3 results) No results for input(s): PROBNP in the last 8760 hours. HbA1C: No results for input(s): HGBA1C in the last 72 hours. CBG: Recent Labs  Lab 08/26/18 2216  GLUCAP 367*   Lipid Profile: No results for input(s): CHOL, HDL, LDLCALC, TRIG, CHOLHDL, LDLDIRECT in the last 72 hours. Thyroid Function Tests: No results for input(s): TSH, T4TOTAL, FREET4, T3FREE, THYROIDAB in the last 72 hours. Anemia Panel: No results for input(s): VITAMINB12, FOLATE, FERRITIN, TIBC, IRON, RETICCTPCT in the last 72 hours. Urine analysis:    Component Value Date/Time   COLORURINE YELLOW 08/26/2018 2025   APPEARANCEUR CLOUDY (A) 08/26/2018 2025   LABSPEC 1.028 08/26/2018 2025   PHURINE 5.0 08/26/2018 2025   GLUCOSEU >=500 (A) 08/26/2018 2025   HGBUR SMALL (A) 08/26/2018 2025   HGBUR negative 09/14/2009 1428   BILIRUBINUR NEGATIVE 08/26/2018 2025   KETONESUR NEGATIVE 08/26/2018 2025   PROTEINUR >=300 (A) 08/26/2018 2025   UROBILINOGEN 1.0 10/27/2013 2022   NITRITE NEGATIVE 08/26/2018 2025   LEUKOCYTESUR SMALL (A) 08/26/2018 2025   Sepsis Labs: @LABRCNTIP (procalcitonin:4,lacticidven:4) )No results found for this or any previous visit (from the past 240 hour(s)).   Radiological Exams on Admission: Dg Ankle Complete Left  Result Date: 08/26/2018 CLINICAL  DATA:  Lateral ankle pain after fall yesterday. EXAM: LEFT ANKLE COMPLETE - 3+ VIEW COMPARISON:  Left ankle x-rays dated Nov 20, 2016. FINDINGS: No acute fracture or dislocation. The ankle mortise is symmetric. The talar dome is intact. No tibiotalar joint effusion. Joint spaces are preserved. Unchanged small plantar and Achilles enthesophytes. Osteopenia. Mild diffuse soft tissue swelling. IMPRESSION: 1.  No acute osseous abnormality. Electronically Signed   By: Titus Dubin M.D.   On: 08/26/2018 17:22   Ct Abdomen Pelvis W Contrast  Result Date: 08/26/2018 CLINICAL DATA:  Fall yesterday. Right lower quadrant and groin pain. EXAM: CT ABDOMEN AND PELVIS WITH CONTRAST TECHNIQUE: Multidetector CT imaging of the abdomen and pelvis was performed using the standard protocol following bolus administration of intravenous contrast. CONTRAST:  129mL OMNIPAQUE IOHEXOL 300 MG/ML  SOLN COMPARISON:  Abdominal ultrasound on 10/27/2013 FINDINGS: Lower chest: No acute abnormality. Hepatobiliary: The liver is unremarkable. The  gallbladder contains some small dependent calcified calculi. No evidence of gallbladder distention, wall thickening or biliary ductal dilatation. Pancreas: Unremarkable. No pancreatic ductal dilatation or surrounding inflammatory changes. Spleen: Normal in size without focal abnormality. Adrenals/Urinary Tract: Adrenal glands are unremarkable. Kidneys are normal, without renal calculi, focal lesion, or hydronephrosis. The bladder contains an anterior air-fluid level as well as evidence of air within the anterior and right anterior wall. Findings are consistent with emphysematous cystitis. No evidence to suggest focal abscess or bladder rupture. Stomach/Bowel: Bowel shows no evidence of obstruction, ileus or inflammation. No lesions identified. The appendix is normal and measures 6 mm. Vascular/Lymphatic: No significant vascular findings are present. No enlarged abdominal or pelvic lymph nodes.  Reproductive: There is irregular air throughout the vaginal cavity. This air is in a configuration that is not typical of a tampon and correlation is suggested clinically and by exam with the possibility of vaginosis. Other: No abdominal wall hernia or abnormality. No focal abscess or ascites. Musculoskeletal: No acute or significant osseous findings. IMPRESSION: 1. Air-fluid level in the bladder as well as air in the bladder wall itself along the anterior and right anterior bladder wall. Findings are consistent with emphysematous cystitis. 2. Irregular air throughout much of the vaginal cavity suspicious for vaginosis. Correlation suggested clinically. Electronically Signed   By: Aletta Edouard M.D.   On: 08/26/2018 20:22   Dg Chest Portable 1 View  Result Date: 08/26/2018 CLINICAL DATA:  Epigastric pain EXAM: PORTABLE CHEST 1 VIEW COMPARISON:  07/19/2018 FINDINGS: The heart size and mediastinal contours are within normal limits. Both lungs are clear. The visualized skeletal structures are unremarkable. IMPRESSION: No active disease. Electronically Signed   By: Ulyses Jarred M.D.   On: 08/26/2018 22:57   Dg Knee Complete 4 Views Left  Result Date: 08/26/2018 CLINICAL DATA:  [place] [address] from standing position EXAM: LEFT KNEE - COMPLETE 4+ VIEW COMPARISON:  11/20/2016 FINDINGS: No fracture or malalignment. Mild patellofemoral degenerative change. Small moderate knee effusion. Mild medial joint space degenerative changes. IMPRESSION: 1. No acute osseous abnormality. 2. Small moderate knee effusion. Electronically Signed   By: Donavan Foil M.D.   On: 08/26/2018 17:22    EKG: Not performed.   Assessment/Plan   1. Emphysematous cystitis   - Severe lower abdominal pain prompted CT that is concerning for emphysematous cystitis  - She is afebrile and hemodynamically stable  - Started on Rocephin in ED  - Send urine for culture, continue empiric Rocephin   2. Bacterial vaginosis  - CT findings concerning for  bacterial vaginosis  - She refused exam in ED  - Flagyl 500 BID    3. Uncontrolled type II DM  - A1c was 12.0% remotely  - She does not have PCP or take any medications  - Serum glucose is 420 in ED without DKA  - Check CBG's, use Novolog sliding-scale for now, consult with case management for help obtaining medications    4. Hypokalemia  - Serum potassium is 2.9 in ED  - Treated with 40 mEq oral potassium in ED  - Add KCl to IVF, give empiric mag, repeat chem panel in am    5. Normocytic anemia  - Hgb is 10.6 with normal MCV, previously normal  - She denies bleeding  - Check anemia panel   6. Protein calorie malnutrition  - Dietary consultation requested    DVT prophylaxis: Lovenox  Code Status: Full  Family Communication: Daughter updated at bedside  Consults called: None Admission status: Inpatient;  she has emphysematous cystitis and will require more than 2 midnights of inpatient treatment for IV antibiotics and monitoring to prevent life-threatening complications.     Vianne Bulls, MD [facility] Pager [phone]  If 7PM-7AM, please contact night-coverage [email or web address] Password [facility]  08/26/2018, 11:14 PM

## 2018-08-27 ENCOUNTER — Encounter (HOSPITAL_COMMUNITY): Payer: Self-pay

## 2018-08-27 DIAGNOSIS — S8002XA Contusion of left knee, initial encounter: Secondary | ICD-10-CM

## 2018-08-27 LAB — CBC WITH DIFFERENTIAL/PLATELET
Abs Immature Granulocytes: 0.06 10*3/uL (ref 0.00–0.07)
Basophils Absolute: 0.1 10*3/uL (ref 0.0–0.1)
Basophils Relative: 1 %
Eosinophils Absolute: 0.3 10*3/uL (ref 0.0–0.5)
Eosinophils Relative: 3 %
HCT: 27.7 % — ABNORMAL LOW (ref 36.0–46.0)
Hemoglobin: 9.4 g/dL — ABNORMAL LOW (ref 12.0–15.0)
Immature Granulocytes: 1 %
Lymphocytes Relative: 24 %
Lymphs Abs: 2.1 10*3/uL (ref 0.7–4.0)
MCH: 31.2 pg (ref 26.0–34.0)
MCHC: 33.9 g/dL (ref 30.0–36.0)
MCV: 92 fL (ref 80.0–100.0)
Monocytes Absolute: 0.8 10*3/uL (ref 0.1–1.0)
Monocytes Relative: 9 %
Neutro Abs: 5.4 10*3/uL (ref 1.7–7.7)
Neutrophils Relative %: 62 %
Platelets: 196 10*3/uL (ref 150–400)
RBC: 3.01 MIL/uL — AB (ref 3.87–5.11)
RDW: 11.9 % (ref 11.5–15.5)
WBC: 8.7 10*3/uL (ref 4.0–10.5)
nRBC: 0 % (ref 0.0–0.2)

## 2018-08-27 LAB — RETICULOCYTES
Immature Retic Fract: 10.3 % (ref 2.3–15.9)
RBC.: 3.01 MIL/uL — ABNORMAL LOW (ref 3.87–5.11)
RETIC CT PCT: 1.8 % (ref 0.4–3.1)
Retic Count, Absolute: 53 10*3/uL (ref 19.0–186.0)

## 2018-08-27 LAB — BASIC METABOLIC PANEL
ANION GAP: 7 (ref 5–15)
BUN: 13 mg/dL (ref 6–20)
CO2: 21 mmol/L — ABNORMAL LOW (ref 22–32)
Calcium: 7.5 mg/dL — ABNORMAL LOW (ref 8.9–10.3)
Chloride: 107 mmol/L (ref 98–111)
Creatinine, Ser: 1.06 mg/dL — ABNORMAL HIGH (ref 0.44–1.00)
GFR calc Af Amer: >60 mL/min (ref >60)
GFR calc non Af Amer: >60 mL/min (ref >60)
Glucose, Bld: 304 mg/dL — ABNORMAL HIGH (ref 70–99)
Potassium: 3.2 mmol/L — ABNORMAL LOW (ref 3.5–5.1)
Sodium: 135 mmol/L (ref 135–145)

## 2018-08-27 LAB — GLUCOSE, CAPILLARY
Glucose-Capillary: 187 mg/dL — ABNORMAL HIGH (ref 70–99)
Glucose-Capillary: 162 mg/dL — ABNORMAL HIGH (ref 70–99)

## 2018-08-27 LAB — FOLATE: Folate: 10.2 ng/mL (ref >5.9)

## 2018-08-27 LAB — CBG MONITORING, ED
Glucose-Capillary: 228 mg/dL — ABNORMAL HIGH (ref 70–99)
Glucose-Capillary: 258 mg/dL — ABNORMAL HIGH (ref 70–99)
Glucose-Capillary: 316 mg/dL — ABNORMAL HIGH (ref 70–99)

## 2018-08-27 LAB — IRON AND TIBC
Iron: 22 ug/dL — ABNORMAL LOW (ref 28–170)
Saturation Ratios: 12 % (ref 10.4–31.8)
TIBC: 182 ug/dL — ABNORMAL LOW (ref 250–450)
UIBC: 160 ug/dL

## 2018-08-27 LAB — FERRITIN: Ferritin: 144 ng/mL (ref 11–307)

## 2018-08-27 LAB — VITAMIN B12: Vitamin B-12: 410 pg/mL (ref 180–914)

## 2018-08-27 LAB — MAGNESIUM: Magnesium: 1.8 mg/dL (ref 1.7–2.4)

## 2018-08-27 MED ORDER — INSULIN DETEMIR 100 UNIT/ML ~~LOC~~ SOLN
10.0000 [IU] | Freq: Every day | SUBCUTANEOUS | Status: DC
  Administered 2018-08-27: 10 [IU] via SUBCUTANEOUS
  Filled 2018-08-27 (×2): qty 0.1

## 2018-08-27 MED ORDER — INSULIN ASPART 100 UNIT/ML ~~LOC~~ SOLN
6.0000 [IU] | Freq: Three times a day (TID) | SUBCUTANEOUS | Status: DC
  Administered 2018-08-28 (×2): 6 [IU] via SUBCUTANEOUS

## 2018-08-27 MED ORDER — POTASSIUM CHLORIDE CRYS ER 20 MEQ PO TBCR
40.0000 meq | EXTENDED_RELEASE_TABLET | Freq: Once | ORAL | Status: AC
  Administered 2018-08-27: 40 meq via ORAL
  Filled 2018-08-27: qty 2

## 2018-08-27 NOTE — Progress Notes (Signed)
Inpatient Diabetes Program Recommendations  AACE/ADA: New Consensus Statement on Inpatient Glycemic Control (2015)  Target Ranges:  Prepandial:   less than 140 mg/dL      Peak postprandial:   less than 180 mg/dL (1-2 hours)      Critically ill patients:  140 - 180 mg/dL   Lab Results  Component Value Date   GLUCAP 228 (H) 08/27/2018   HGBA1C 12.0 09/14/2009    Review of Glycemic Control Results for Melanie Hall, Melanie Hall (MRN [id number]) as of 08/27/2018 13:31  Ref. Range 08/26/2018 22:16 08/27/2018 00:04 08/27/2018 07:22 08/27/2018 11:53  Glucose-Capillary Latest Ref Range: 70 - 99 mg/dL 367 (H) 316 (H) 258 (H) 228 (H)   Diabetes history: DM2 Outpatient Diabetes medications: None Current orders for Inpatient glycemic control: Novolog sensitive correction tid + hs  Inpatient Diabetes Program Recommendations:   Noted A1c is pending. Please consider: -Levemir 9 units qd (0.15 units/kg x 61.2 kg) -Novolog 3 units tid meal coverage if eats 50%  Noted patient does not have insurance listed so if need insulin on discharge Novolin 70/30 insulin from [facility] would be less expensive.  Thank you, Nani Gasser. Ovila Lepage, RN, MSN, CDE  Diabetes Coordinator Inpatient Glycemic Control Team Team Pager [phone] (8am-5pm) 08/27/2018 1:36 PM

## 2018-08-27 NOTE — ED Notes (Signed)
Pt wants to hold off on pain medication until she can get her next dose of zofran.

## 2018-08-27 NOTE — ED Notes (Signed)
Pt requesting something for pain and nausea, orders released and carried out

## 2018-08-27 NOTE — Progress Notes (Signed)
PROGRESS NOTE    Melanie Hall  WER:[id number] DOB: Jun 17, 1971 DOA: 08/26/2018 PCP: Patient, No Pcp Per     Brief Narrative:  48 y.o. female with medical history significant for diabetes mellitus not currently on any medications, now presenting to the emergency department with left knee and ankle pain after a fall, and developed severe lower abdominal pain while in the ED.  Patient reports that she had been in her usual state until she tripped and fell earlier today, did not hit her head or lose consciousness, but has been experiencing severe pain in the left ankle and knee since the fall.  She has been able to bear weight, but with significant pain.  She went on to develop severe pain in the lower abdomen, constant, localized, and without any alleviating or exacerbating factors identified.  She has not noted any fevers or chills and denies any significant dysuria, gross hematuria, or vaginal discharge.  Patient complaining of nausea and radiograph evaluation demonstrating emphysematous cystitis as well as air throughout much of the vaginal cavity suggestive of vaginosis.  Assessment & Plan: 1-Emphysematous cystitis and vaginosis -Continue treatment with Rocephin and Flagyl -Maintain adequate hydration -Continue as needed antiemetics -Follow culture results -Control blood sugar.  2-Uncontrolled diabetes mellitus with hyperglycemia ([name]) -Patient without PCP and was no using any type of hypoglycemic regimen prior to admission. -Follow A1c -Continue sliding scale insulin -start Levemir nightly and NovoLog for meal coverage -Follow CBGs and further adjust regimen as needed.  3-Hypokalemia -Potassium 3.2 -Continue supplementation and follow-up be met in a.m. -Magnesium within normal limits.  4-Normocytic anemia -No signs of acute bleeding appreciated -Most likely associated with iron deficiency anemia (nutritional and from ongoing menstrual.).  5-elevated blood pressure -In the  setting of acute pain from injury to her leg after falling -Continue monitoring but hold on antihypertensive regimen at this time -If blood pressure remains elevated by time of discharge will initiate low-dose of ACE inhibitor.  6-left knee and left ankle injury -No fractures appreciated -Continue brace and as needed pain medication.  DVT prophylaxis: Lovenox Code Status: Full code Family Communication: Daughter at bedside Disposition Plan: Remains inpatient.  Continue IV antibiotics and assess for ability to maintain adequate nutrition and hydration.  Follow A1c and continue adjusting hypoglycemic regimen.  Consultants:   None  Procedures:   See below for x-ray reports  Antimicrobials:  Anti-infectives (From admission, onward)   Start     Dose/Rate Route Frequency Ordered Stop   08/27/18 2200  cefTRIAXone (ROCEPHIN) 1 g in sodium chloride 0.9 % 100 mL IVPB     1 g 200 mL/hr over 30 Minutes Intravenous Every 24 hours 08/26/18 2230     08/26/18 2245  metroNIDAZOLE (FLAGYL) tablet 500 mg     500 mg Oral Every 12 hours 08/26/18 2230 09/02/18 2159   08/26/18 2200  cefTRIAXone (ROCEPHIN) 1 g in sodium chloride 0.9 % 100 mL IVPB     1 g 200 mL/hr over 30 Minutes Intravenous  Once 08/26/18 2150 08/26/18 2234       Subjective: Afebrile, no chest pain, no shortness of breath.  Still complaining of abdominal pain and is present intermittent nausea.  No vomiting.  Objective: Vitals:   08/27/18 1400 08/27/18 1415 08/27/18 1430 08/27/18 1500  BP: (!) 142/84  (!) 148/79 (!) 167/92  Pulse: 81 76 79 89  Resp:   18 18  Temp:   98 F (36.7 C) 98 F (36.7 C)  TempSrc:   Oral Oral  SpO2: 98% 98% 99% 100%  Weight:      Height:        Intake/Output Summary (Last 24 hours) at 08/27/2018 1911 Last data filed at 08/27/2018 1700 Gross per 24 hour  Intake 3240 ml  Output -  Net 3240 ml   Filed Weights   08/26/18 1605  Weight: 61.2 kg    Examination: General exam: Alert, awake,  oriented x 3.  Still complaining of abdominal pain.  No chest pain, no shortness of breath.  Patient is afebrile currently.  No vomiting.  Expressed mild nausea. Respiratory system: Clear to auscultation. Respiratory effort normal. Cardiovascular system:RRR. No murmurs, rubs, gallops. Gastrointestinal system: Abdomen is nondistended, soft and tender to palpation in her suprapubic area, bilateral lower quadrants and left flank. No organomegaly or masses felt. Normal bowel sounds heard. Central nervous system: Alert and oriented. No focal neurological deficits. Extremities: No C/C/E, +pedal pulses Skin: No rashes, lesions or ulcers Psychiatry: Judgement and insight appear normal. Mood & affect appropriate.     Data Reviewed: I have personally reviewed following labs and imaging studies  CBC: Recent Labs  Lab 08/26/18 1825 08/27/18 0547  WBC 10.3 8.7  NEUTROABS 6.7 5.4  HGB 10.6* 9.4*  HCT 30.3* 27.7*  MCV 87.6 92.0  PLT 235 203   Basic Metabolic Panel: Recent Labs  Lab 08/26/18 1825 08/27/18 0547  NA 132* 135  K 2.9* 3.2*  CL 102 107  CO2 22 21*  GLUCOSE 420* 304*  BUN 13 13  CREATININE 0.98 1.06*  CALCIUM 7.8* 7.5*  MG  --  1.8   GFR: Estimated Creatinine Clearance: 55.8 mL/min (A) (by C-G formula based on SCr of 1.06 mg/dL (H)).   Liver Function Tests: Recent Labs  Lab 08/26/18 1825  AST 16  ALT 11  ALKPHOS 87  BILITOT 0.9  PROT 5.6*  ALBUMIN 2.1*   Recent Labs  Lab 08/26/18 1825  LIPASE 24   CBG: Recent Labs  Lab 08/26/18 2216 08/27/18 0004 08/27/18 0722 08/27/18 1153 08/27/18 1607  GLUCAP 367* 316* 258* 228* 187*   Anemia Panel: Recent Labs    08/27/18 0547  VITAMINB12 410  FOLATE 10.2  FERRITIN 144  TIBC 182*  IRON 22*  RETICCTPCT 1.8   Urine analysis:    Component Value Date/Time   COLORURINE YELLOW 08/26/2018 2025   APPEARANCEUR CLOUDY (A) 08/26/2018 2025   LABSPEC 1.028 08/26/2018 2025   PHURINE 5.0 08/26/2018 2025    GLUCOSEU >=500 (A) 08/26/2018 2025   HGBUR SMALL (A) 08/26/2018 2025   HGBUR negative 09/14/2009 1428   BILIRUBINUR NEGATIVE 08/26/2018 2025   KETONESUR NEGATIVE 08/26/2018 2025   PROTEINUR >=300 (A) 08/26/2018 2025   UROBILINOGEN 1.0 10/27/2013 2022   NITRITE NEGATIVE 08/26/2018 2025   LEUKOCYTESUR SMALL (A) 08/26/2018 2025    Radiology Studies: Dg Ankle Complete Left  Result Date: 08/26/2018 CLINICAL DATA:  Lateral ankle pain after fall yesterday. EXAM: LEFT ANKLE COMPLETE - 3+ VIEW COMPARISON:  Left ankle x-rays dated Nov 20, 2016. FINDINGS: No acute fracture or dislocation. The ankle mortise is symmetric. The talar dome is intact. No tibiotalar joint effusion. Joint spaces are preserved. Unchanged small plantar and Achilles enthesophytes. Osteopenia. Mild diffuse soft tissue swelling. IMPRESSION: 1.  No acute osseous abnormality. Electronically Signed   By: Titus Dubin M.D.   On: 08/26/2018 17:22   Ct Abdomen Pelvis W Contrast  Result Date: 08/26/2018 CLINICAL DATA:  Fall yesterday. Right lower quadrant and groin pain. EXAM: CT ABDOMEN AND  PELVIS WITH CONTRAST TECHNIQUE: Multidetector CT imaging of the abdomen and pelvis was performed using the standard protocol following bolus administration of intravenous contrast. CONTRAST:  156m OMNIPAQUE IOHEXOL 300 MG/ML  SOLN COMPARISON:  Abdominal ultrasound on 10/27/2013 FINDINGS: Lower chest: No acute abnormality. Hepatobiliary: The liver is unremarkable. The gallbladder contains some small dependent calcified calculi. No evidence of gallbladder distention, wall thickening or biliary ductal dilatation. Pancreas: Unremarkable. No pancreatic ductal dilatation or surrounding inflammatory changes. Spleen: Normal in size without focal abnormality. Adrenals/Urinary Tract: Adrenal glands are unremarkable. Kidneys are normal, without renal calculi, focal lesion, or hydronephrosis. The bladder contains an anterior air-fluid level as well as evidence of  air within the anterior and right anterior wall. Findings are consistent with emphysematous cystitis. No evidence to suggest focal abscess or bladder rupture. Stomach/Bowel: Bowel shows no evidence of obstruction, ileus or inflammation. No lesions identified. The appendix is normal and measures 6 mm. Vascular/Lymphatic: No significant vascular findings are present. No enlarged abdominal or pelvic lymph nodes. Reproductive: There is irregular air throughout the vaginal cavity. This air is in a configuration that is not typical of a tampon and correlation is suggested clinically and by exam with the possibility of vaginosis. Other: No abdominal wall hernia or abnormality. No focal abscess or ascites. Musculoskeletal: No acute or significant osseous findings. IMPRESSION: 1. Air-fluid level in the bladder as well as air in the bladder wall itself along the anterior and right anterior bladder wall. Findings are consistent with emphysematous cystitis. 2. Irregular air throughout much of the vaginal cavity suspicious for vaginosis. Correlation suggested clinically. Electronically Signed   By: GAletta EdouardM.D.   On: 08/26/2018 20:22   Dg Chest Portable 1 View  Result Date: 08/26/2018 CLINICAL DATA:  Epigastric pain EXAM: PORTABLE CHEST 1 VIEW COMPARISON:  07/19/2018 FINDINGS: The heart size and mediastinal contours are within normal limits. Both lungs are clear. The visualized skeletal structures are unremarkable. IMPRESSION: No active disease. Electronically Signed   By: KUlyses JarredM.D.   On: 08/26/2018 22:57   Dg Knee Complete 4 Views Left  Result Date: 08/26/2018 CLINICAL DATA:  FGolden Circlefrom standing position EXAM: LEFT KNEE - COMPLETE 4+ VIEW COMPARISON:  11/20/2016 FINDINGS: No fracture or malalignment. Mild patellofemoral degenerative change. Small moderate knee effusion. Mild medial joint space degenerative changes. IMPRESSION: 1. No acute osseous abnormality. 2. Small moderate knee effusion.  Electronically Signed   By: KDonavan FoilM.D.   On: 08/26/2018 17:22    Scheduled Meds: . enoxaparin (LOVENOX) injection  40 mg Subcutaneous Q24H  . insulin aspart  0-5 Units Subcutaneous QHS  . insulin aspart  0-9 Units Subcutaneous TID WC  . [START ON 08/28/2018] insulin aspart  6 Units Subcutaneous TID WC  . insulin detemir  10 Units Subcutaneous QHS  . metroNIDAZOLE  500 mg Oral Q12H  . sodium chloride flush  3 mL Intravenous Q12H   Continuous Infusions: . cefTRIAXone (ROCEPHIN)  IV       LOS: 1 day    Time spent: 30 minutes    CBarton Dubois MD [facility] Pager [phone]  08/27/2018, 7:11 PM

## 2018-08-28 DIAGNOSIS — S93402A Sprain of unspecified ligament of left ankle, initial encounter: Secondary | ICD-10-CM

## 2018-08-28 LAB — HEMOGLOBIN A1C
Hgb A1c MFr Bld: 13.3 % — ABNORMAL HIGH (ref 4.8–5.6)
Mean Plasma Glucose: 335 mg/dL

## 2018-08-28 LAB — BASIC METABOLIC PANEL
Anion gap: 7 (ref 5–15)
BUN: 15 mg/dL (ref 6–20)
CHLORIDE: 108 mmol/L (ref 98–111)
CO2: 22 mmol/L (ref 22–32)
Calcium: 8.1 mg/dL — ABNORMAL LOW (ref 8.9–10.3)
Creatinine, Ser: 1.61 mg/dL — ABNORMAL HIGH (ref 0.44–1.00)
GFR calc Af Amer: 43 mL/min — ABNORMAL LOW (ref >60)
GFR calc non Af Amer: 37 mL/min — ABNORMAL LOW (ref >60)
Glucose, Bld: 134 mg/dL — ABNORMAL HIGH (ref 70–99)
Potassium: 3.2 mmol/L — ABNORMAL LOW (ref 3.5–5.1)
Sodium: 137 mmol/L (ref 135–145)

## 2018-08-28 LAB — RPR: RPR Ser Ql: NONREACTIVE

## 2018-08-28 LAB — GLUCOSE, CAPILLARY
Glucose-Capillary: 116 mg/dL — ABNORMAL HIGH (ref 70–99)
Glucose-Capillary: 83 mg/dL (ref 70–99)

## 2018-08-28 LAB — HIV ANTIBODY (ROUTINE TESTING W REFLEX): HIV Screen 4th Generation wRfx: NONREACTIVE

## 2018-08-28 MED ORDER — LISINOPRIL 5 MG PO TABS: 5.0000 mg | ORAL_TABLET | Freq: Every day | ORAL | 3 refills | Status: DC

## 2018-08-28 MED ORDER — ONDANSETRON 8 MG PO TBDP: 8.0000 mg | ORAL_TABLET | Freq: Three times a day (TID) | ORAL | 0 refills | Status: DC | PRN

## 2018-08-28 MED ORDER — GLUCERNA SHAKE PO LIQD: 237.0000 mL | Freq: Two times a day (BID) | ORAL | Status: DC

## 2018-08-28 MED ORDER — CEFUROXIME AXETIL 500 MG PO TABS: 500.0000 mg | ORAL_TABLET | Freq: Two times a day (BID) | ORAL | 0 refills | Status: AC

## 2018-08-28 MED ORDER — BLOOD GLUCOSE MONITOR KIT: PACK | 0 refills | Status: DC

## 2018-08-28 MED ORDER — METRONIDAZOLE 500 MG PO TABS: 500.0000 mg | ORAL_TABLET | Freq: Two times a day (BID) | ORAL | 0 refills | Status: AC

## 2018-08-28 MED ORDER — LIVING WELL WITH DIABETES BOOK
Freq: Once | Status: AC
  Administered 2018-08-28: 14:00:00
  Filled 2018-08-28: qty 1

## 2018-08-28 MED ORDER — METFORMIN HCL 500 MG PO TABS: 500.0000 mg | ORAL_TABLET | Freq: Two times a day (BID) | ORAL | 3 refills | Status: DC

## 2018-08-28 MED ORDER — ADULT MULTIVITAMIN W/MINERALS CH
1.0000 | ORAL_TABLET | Freq: Every day | ORAL | Status: DC
  Administered 2018-08-28: 1 via ORAL
  Filled 2018-08-28: qty 1

## 2018-08-28 MED ORDER — PANTOPRAZOLE SODIUM 40 MG PO TBEC: 40.0000 mg | DELAYED_RELEASE_TABLET | Freq: Every day | ORAL | 1 refills | Status: DC

## 2018-08-28 MED ORDER — INSULIN DETEMIR 100 UNIT/ML FLEXPEN: 15.0000 [IU] | Freq: Every day | SUBCUTANEOUS | 3 refills | Status: DC

## 2018-08-28 MED ORDER — ACETAMINOPHEN 325 MG PO TABS: 650.0000 mg | ORAL_TABLET | Freq: Four times a day (QID) | ORAL | 0 refills | Status: DC | PRN

## 2018-08-28 MED ORDER — PEN NEEDLES 31G X 8 MM MISC: 3 refills | Status: DC

## 2018-08-28 NOTE — Care Management (Signed)
Pt provided with MATCH and appointment with [facility].

## 2018-08-28 NOTE — Progress Notes (Signed)
Living Well With Diabetes book reviewed with patient and insulin administration repeat demonstrated with orange.

## 2018-08-28 NOTE — Progress Notes (Signed)
Initial Nutrition Assessment  DOCUMENTATION CODES:  Not applicable  INTERVENTION:  Diabetes Education w/ handouts  Conference with CM regarding appointments at [facility]  Glucerna Shake po BID, each supplement provides 220 kcal and 10 grams of protein  MVI with minerals  NUTRITION DIAGNOSIS:  Altered nutrition lab value related to decreased appetite, lack of knowledge/education, poor diet quality as evidenced by A1C >9.4 x 10 years.  GOAL:  Patient will meet greater than or equal to 90% of their needs  MONITOR:   PO intake, Supplement acceptance, Diet advancement, Weight trends, I & O's, Labs  REASON FOR ASSESSMENT:  Consult Assessment of nutrition requirement/status  ASSESSMENT:  48 y/o female PMHx DM2. Presented to ED w/ L knee/ankle and abdominal pain after falling at home. In ED, BG was >400, K: 2.9. Radiographs largely negative, but CT of abdomen concerning for cystitis. UA also compatible w/ infection. Pt admitted for management.   Pts A1c was found to be 13.3. RD also noted pt has a long history of elevated A1c - per Epic records, her a1c has not been below 9.4 in over 10 years. As such, RD geared conversation towards DM education.   When RD broached topic of Diabetic control, pt started crying. She is upset because she feels she does all she can to control her sugars, but the recurrent infections are what cause them to stay high. She says she watches what she eats and does not eat much quantity wise. She says she used to be over 250 lbs, but she drastically reduced her intake and now her stomach has shrunk to the point she can only eats little amounts. Of note, chart shows her wt has been 135-141 lbs for the past year and <150 for the past 5 years.   Took diet recall. She skips breakfast each day and says this is because she is not hungry at all. She also has 2 teenage daughters she has to see off to their school/jobs. For lunch, she usually eats fast food; the example  she gives is a "soft taco". Dinner items varies - examples are pizza, salads, steak, chicken, pork. She drinks diet soda, water and green tea (not always sugar free. She doesn't snack.  RD provided diabetic education. Stressed the importance of the not skipping meals. Explained physiological/glycemic response to skipped meals. Reviewed importance of not skipping meals in setting of insulin therapy d/t danger of hypoglycemia. Noted her body has adapted to not eating in morning-she will need to initally force herself to eat until her body readapts to a normal eating schedule. RD reviewed the plate method. She says her daughter has 2 physical  "My Plates" at home that she can use at meal to ensure portion ratios are correct. RD reviewed label reading, highlighting importance of reading portion size. Recommend choosing items high in pro/fiber. Gave her goal of 60 g carb/meals. When eating out, recommended using smart phone to look up nutrition info.   RD recommended OP follow up for DM education. She says she missed too many appts to be seen at [facility] until next year. She is working with CM to get appt with [facility]. RD stressed importance of checking BG.   At this time, pt eating relatively well. She has complaints of constant pain in back and side, that she does not believe is her bladder. Will add glucerna BID in between meals d/t report of only being able to tolerate small amounts at a time.   Labs: A1c:  13.3, K: 2.9->3.2, BUN/creat:15/1.61, Albumin: 2.1 (likely r/t infection),  Meds: Insulin, IV abx,   Recent Labs  Lab 08/26/18 1825 08/27/18 0547 08/28/18 0559  NA 132* 135 137  K 2.9* 3.2* 3.2*  CL 102 107 108  CO2 22 21* 22  BUN 13 13 15   CREATININE 0.98 1.06* 1.61*  CALCIUM 7.8* 7.5* 8.1*  MG  --  1.8  --   GLUCOSE 420* 304* 134*   NUTRITION - FOCUSED PHYSICAL EXAM: Deferred  Diet Order:   Diet Order            Diet Carb Modified Fluid consistency: Thin; Room service  appropriate? Yes  Diet effective now             EDUCATION NEEDS:  Education needs have been addressed  Skin:  Reviewed RN Assessment  Last BM:  2/19  Height:  Ht Readings from Last 1 Encounters:  08/26/18 5\' 2"  (1.575 m)   Weight:  Wt Readings from Last 1 Encounters:  08/26/18 61.2 kg   Wt Readings from Last 10 Encounters:  08/26/18 61.2 kg  07/19/18 64 kg  11/06/17 62.1 kg  10/02/17 64 kg  09/28/17 63.2 kg  01/11/17 66.7 kg  11/20/16 66.7 kg  02/10/16 68 kg  10/27/13 64 kg   Ideal Body Weight:  50 kg  BMI:  Body mass index is 24.69 kg/m.  Estimated Nutritional Needs:  Kcal:  1700-1900 (28-31 kcal/kg bw) Protein:  73-86g Pro (1.2-1.4 g/kg bw) Fluid:  1.7-1.9 Liters (1 ml/kcal)  Burtis Junes RD, LDN, CNSC Clinical Nutrition Available Tues-Sat via Pager: [phone] 08/28/2018 10:33 AM

## 2018-08-28 NOTE — Progress Notes (Signed)
IV removed and discharge instructions reviewed.  Scripts given with discharge instructions as well as paperwork from Case Management with appt scheduled with [facility] on Monday

## 2018-08-28 NOTE — Discharge Summary (Signed)
Physician Discharge Summary  Melanie Hall AOZ:[id number] DOB: 1970-09-20 DOA: 08/26/2018  PCP: Patient, No Pcp Per  Admit date: 08/26/2018 Discharge date: 08/28/2018  Time spent: 35 minutes  Recommendations for Outpatient Follow-up:  1. Repeat CBC to follow hemoglobin trend 2. Repeat basic metabolic panel to follow electrolytes and renal function 3. Reassess blood pressure and further adjust antihypertensive regimen as needed 4. Close follow-up to patient's CBGs with further adjustment to hypoglycemic regimen as needed. 5. Reassess resolution of patient's urinary tract infection symptoms and if needed make appropriate referral to be seen by urology service.   Discharge Diagnoses:  Principal Problem:   Emphysematous cystitis Active Problems:   Uncontrolled diabetes mellitus with hyperglycemia (HCC)   Vaginosis   Hypokalemia   Normocytic anemia   Sprain of left ankle GERD Elevated blood pressure (no prior history of hypertension).  Discharge Condition: Stable and improved.  Patient discharged home with instruction to follow-up on February 24 with Melanie Hall needs to establish primary care services and to have hospital follow-up assessment.  Diet recommendation: Modified carbohydrates and low-sodium diet.  Filed Weights   08/26/18 1605  Weight: 61.2 kg    History of present illness:  As per H&P written by Dr. Myna Hall on 08/26/2018 48 y.o. female with medical history significant for diabetes mellitus not currently on any medications, now presenting to the emergency department with left knee and ankle pain after a fall, and developed severe lower abdominal pain while in the ED.  Patient reports that she had been in her usual state until she tripped and fell earlier today, did not hit her head or lose consciousness, but has been experiencing severe pain in the left ankle and knee since the fall.  She has been able to bear weight, but with significant pain.  She went on to develop severe pain in  the lower abdomen, constant, localized, and without any alleviating or exacerbating factors identified.  She has not noted any fevers or chills and denies any significant dysuria, gross hematuria, or vaginal discharge.  ED Course: Upon arrival to the ED, patient is found to be afebrile, saturating well on room air, slightly tachycardic, and with stable blood pressure.  Chemistry panel is notable for glucose of 420, potassium 2.9, and albumin 2.1.  CBC features a normocytic anemia with hemoglobin 10.6.  Urinalysis is compatible with infection.  Radiographs of the left knee and ankle are notable for a small left knee effusion, but otherwise negative.  CT the abdomen and pelvis is concerning for emphysematous cystitis as well as air throughout much of the vaginal cavity suggestive of vaginosis.  Patient was given IV fluids, Rocephin, Dilaudid, oral potassium, and ankle was immobilized in the ED.  She remains hemodynamically stable and will be admitted for further evaluation and management.   Hospital Course:  1-Emphysematous cystitis and vaginosis -Continue treatment with Ceftin and Flagyl -Advised to maintain adequate hydration -Continue as needed antiemetics (discharged on as needed Zofran ODT). -Control blood sugar.  2-Uncontrolled diabetes mellitus with hyperglycemia ([place]) -Patient without PCP and was no using any type of hypoglycemic regimen prior to admission. -A1c 13.3 -Patient discharged on Levemir and metformin -Advised to check her blood sugar 3 times a day and to follow modified carbohydrate diet. -She will require further adjustment to hypoglycemic management based on CBGs fluctuation. -After discussing with patient treatment options she was against 5 shots a day in order to provide best coverage using insulin therapy.  3-Hypokalemia -Potassium 3.2 -Patient has been discharged on lisinopril  and instructed to take a multivitamins daily. -Magnesium within normal limits. -Repeat  basic metabolic panel follow-up visit to reassess electrolytes.  4-Normocytic anemia -No signs of acute bleeding appreciated -Most likely associated with iron deficiency anemia (nutritional and from ongoing menstrual.). -Repeat CBC to follow hemoglobin trend at follow-up visit.  5-elevated blood pressure -In the setting of acute pain from injury to her leg after falling -Patient will be discharged on low-dose lisinopril to assist achieving blood pressure goal and to provide renal protection in the setting of underlying diabetes. -Reassess blood pressure and further adjust regimen as needed.  6-left knee and left ankle injury -No fractures appreciated -Continue brace and as needed pain medication. -As needed follow-up with orthopedic service as an outpatient.  7-GERD -Patient discharged on PPI.  Procedures:  See below for x-ray reports.  Consultations:  None  Discharge Exam: Vitals:   08/28/18 0547 08/28/18 1337  BP: (!) 149/92 136/75  Pulse: 92 87  Resp: 18 17  Temp: 99.6 F (37.6 C) 98.2 F (36.8 C)  SpO2: 100% 99%   General exam: Alert, awake, oriented x 3.  Still complaining of abdominal pain.  No chest pain, no shortness of breath.  Patient is afebrile and expressing just mild intermittent episode of nausea.   Respiratory system: Clear to auscultation. Respiratory effort normal. Cardiovascular system:RRR. No murmurs, rubs, gallops. Gastrointestinal system: Abdomen is nondistended, soft and tender to palpation in her left flank area. No organomegaly or masses felt. Normal bowel sounds heard. Central nervous system: Alert and oriented. No focal neurological deficits. Extremities: No C/C/E, +pedal pulses Skin: No rashes, lesions or ulcers Psychiatry: Judgement and insight appear normal. Mood & affect appropriate.    Discharge Instructions   Discharge Instructions    Diet - low sodium heart healthy   Complete by:  As directed    Diet Carb Modified   Complete  by:  As directed    Discharge instructions   Complete by:  As directed    Take medications as prescribed Keep yourself well-hydrated Follow modified carbohydrate diet (less than 56 g of carbohydrate daily) Watch sodium intake Follow-up as instructed to establish care with primary care physician and have further adjustment in the treatment of your uncontrolled diabetes.     Allergies as of 08/28/2018   No Known Allergies     Medication List    TAKE these medications   acetaminophen 325 MG tablet Commonly known as:  TYLENOL Take 2 tablets (650 mg total) by mouth every 6 (six) hours as needed for mild pain or headache (or Fever >/= 101).   blood glucose meter kit and supplies Kit Dispense based on patient and insurance preference. Use to check blood sugar three times daily as directed. (FOR ICD-9 250.00, 250.01).   cefUROXime 500 MG tablet Commonly known as:  CEFTIN Take 1 tablet (500 mg total) by mouth 2 (two) times daily for 7 days.   insulin detemir 100 unit/ml Soln Commonly known as:  LEVEMIR Inject 0.15 mLs (15 Units total) into the skin daily.   lisinopril 5 MG tablet Commonly known as:  PRINIVIL,ZESTRIL Take 1 tablet (5 mg total) by mouth daily.   metFORMIN 500 MG tablet Commonly known as:  GLUCOPHAGE Take 1 tablet (500 mg total) by mouth 2 (two) times daily with a meal.   metroNIDAZOLE 500 MG tablet Commonly known as:  FLAGYL Take 1 tablet (500 mg total) by mouth every 12 (twelve) hours for 6 days.   ondansetron 8 MG disintegrating tablet  Commonly known as:  ZOFRAN ODT Take 1 tablet (8 mg total) by mouth every 8 (eight) hours as needed for nausea or vomiting.   pantoprazole 40 MG tablet Commonly known as:  PROTONIX Take 1 tablet (40 mg total) by mouth daily.   Pen Needles 31G X 8 MM Misc Use daily to inject insulin as instructed      No Known Allergies Follow-up Information    Call  Carole Civil, MD.   Specialties:  Orthopedic Surgery,  Radiology Why:  If symptoms worsen Contact information: [address] [place] [address] [place] Follow up on 09/01/2018.   Why:  9:30 AM Contact information: [address] [place], [place] [address] [phone]           The results of significant diagnostics from this hospitalization (including imaging, microbiology, ancillary and laboratory) are listed below for reference.    Significant Diagnostic Studies: Dg Ankle Complete Left  Result Date: 08/26/2018 CLINICAL DATA:  Lateral ankle pain after fall yesterday. EXAM: LEFT ANKLE COMPLETE - 3+ VIEW COMPARISON:  Left ankle x-rays dated Nov 20, 2016. FINDINGS: No acute fracture or dislocation. The ankle mortise is symmetric. The talar dome is intact. No tibiotalar joint effusion. Joint spaces are preserved. Unchanged small plantar and Achilles enthesophytes. Osteopenia. Mild diffuse soft tissue swelling. IMPRESSION: 1.  No acute osseous abnormality. Electronically Signed   By: Titus Dubin M.D.   On: 08/26/2018 17:22   Ct Abdomen Pelvis W Contrast  Result Date: 08/26/2018 CLINICAL DATA:  Fall yesterday. Right lower quadrant and groin pain. EXAM: CT ABDOMEN AND PELVIS WITH CONTRAST TECHNIQUE: Multidetector CT imaging of the abdomen and pelvis was performed using the standard protocol following bolus administration of intravenous contrast. CONTRAST:  114m OMNIPAQUE IOHEXOL 300 MG/ML  SOLN COMPARISON:  Abdominal ultrasound on 10/27/2013 FINDINGS: Lower chest: No acute abnormality. Hepatobiliary: The liver is unremarkable. The gallbladder contains some small dependent calcified calculi. No evidence of gallbladder distention, wall thickening or biliary ductal dilatation. Pancreas: Unremarkable. No pancreatic ductal dilatation or surrounding inflammatory changes. Spleen: Normal in size without focal abnormality. Adrenals/Urinary Tract: Adrenal glands are unremarkable. Kidneys are normal, without renal  calculi, focal lesion, or hydronephrosis. The bladder contains an anterior air-fluid level as well as evidence of air within the anterior and right anterior wall. Findings are consistent with emphysematous cystitis. No evidence to suggest focal abscess or bladder rupture. Stomach/Bowel: Bowel shows no evidence of obstruction, ileus or inflammation. No lesions identified. The appendix is normal and measures 6 mm. Vascular/Lymphatic: No significant vascular findings are present. No enlarged abdominal or pelvic lymph nodes. Reproductive: There is irregular air throughout the vaginal cavity. This air is in a configuration that is not typical of a tampon and correlation is suggested clinically and by exam with the possibility of vaginosis. Other: No abdominal wall hernia or abnormality. No focal abscess or ascites. Musculoskeletal: No acute or significant osseous findings. IMPRESSION: 1. Air-fluid level in the bladder as well as air in the bladder wall itself along the anterior and right anterior bladder wall. Findings are consistent with emphysematous cystitis. 2. Irregular air throughout much of the vaginal cavity suspicious for vaginosis. Correlation suggested clinically. Electronically Signed   By: GAletta EdouardM.D.   On: 08/26/2018 20:22   Dg Chest Portable 1 View  Result Date: 08/26/2018 CLINICAL DATA:  Epigastric pain EXAM: PORTABLE CHEST 1 VIEW COMPARISON:  07/19/2018 FINDINGS: The heart size and mediastinal contours are within  normal limits. Both lungs are clear. The visualized skeletal structures are unremarkable. IMPRESSION: No active disease. Electronically Signed   By: Ulyses Jarred M.D.   On: 08/26/2018 22:57   Dg Knee Complete 4 Views Left  Result Date: 08/26/2018 CLINICAL DATA:  [place] [address] from standing position EXAM: LEFT KNEE - COMPLETE 4+ VIEW COMPARISON:  11/20/2016 FINDINGS: No fracture or malalignment. Mild patellofemoral degenerative change. Small moderate knee effusion. Mild medial joint  space degenerative changes. IMPRESSION: 1. No acute osseous abnormality. 2. Small moderate knee effusion. Electronically Signed   By: Donavan Foil M.D.   On: 08/26/2018 17:22    Microbiology: Recent Results (from the past 240 hour(s))  Urine Culture     Status: Abnormal (Preliminary result)   Collection Time: 08/26/18  8:25 PM  Result Value Ref Range Status   Specimen Description   Final    URINE, CLEAN CATCH Performed at [facility], [address] [place], [place] [address]    Special Requests   Final    Normal Performed at [facility], [address] [place], [place] [address]    Culture >=100,000 COLONIES/mL ESCHERICHIA COLI (A)  Final   Report Status PENDING  Incomplete     Labs: Basic Metabolic Panel: Recent Labs  Lab 08/26/18 1825 08/27/18 0547 08/28/18 0559  NA 132* 135 137  K 2.9* 3.2* 3.2*  CL 102 107 108  CO2 22 21* 22  GLUCOSE 420* 304* 134*  BUN '13 13 15  ' CREATININE 0.98 1.06* 1.61*  CALCIUM 7.8* 7.5* 8.1*  MG  --  1.8  --    Liver Function Tests: Recent Labs  Lab 08/26/18 1825  AST 16  ALT 11  ALKPHOS 87  BILITOT 0.9  PROT 5.6*  ALBUMIN 2.1*   Recent Labs  Lab 08/26/18 1825  LIPASE 24   CBC: Recent Labs  Lab 08/26/18 1825 08/27/18 0547  WBC 10.3 8.7  NEUTROABS 6.7 5.4  HGB 10.6* 9.4*  HCT 30.3* 27.7*  MCV 87.6 92.0  PLT 235 196   CBG: Recent Labs  Lab 08/27/18 1153 08/27/18 1607 08/27/18 2117 08/28/18 0824 08/28/18 1115  GLUCAP 228* 187* 162* 116* 83    Signed:  Barton Dubois MD.  [facility] 08/28/2018, 2:22 PM

## 2018-08-28 NOTE — Progress Notes (Addendum)
Inpatient Diabetes Program Recommendations  AACE/ADA: New Consensus Statement on Inpatient Glycemic Control (2015)  Target Ranges:  Prepandial:   less than 140 mg/dL      Peak postprandial:   less than 180 mg/dL (1-2 hours)      Critically ill patients:  140 - 180 mg/dL   Lab Results  Component Value Date   GLUCAP 83 08/28/2018   HGBA1C 13.3 (H) 08/27/2018    Review of Glycemic Control Results for Melanie Hall, Melanie Hall (MRN [id number]) as of 08/28/2018 13:11  Ref. Range 08/27/2018 11:53 08/27/2018 16:07 08/27/2018 21:17 08/28/2018 08:24 08/28/2018 11:15  Glucose-Capillary Latest Ref Range: 70 - 99 mg/dL 228 (H) 187 (H) 162 (H) 116 (H) 83   Inpatient Diabetes Program Recommendations:   Spoke with pt by phone (DM coordinator not on [facility]) about A1C 13.3 (average blood glucose 335 over the past 2-3 months) results with them and explained what an A1C is, basic pathophysiology of DM Type 2, basic home care, basic diabetes diet nutrition principles, importance of checking CBGs and maintaining good CBG control to prevent long-term and short-term complications. Reviewed signs and symptoms of hyperglycemia and hypoglycemia and how to treat hypoglycemia at home. Also reviewed blood sugar goals at home.  RNs to provide ongoing basic DM education at bedside with this patient. Have ordered educational booklet, insulin starter kit, and DM videos.  Discussed insulin administration with patient and patient willing to start insulin @ home. Reviewed insulin @ [facility] less expensive than prescription unless patient qualifies for a program. Reviewed basic timing of 70/30 insulin if ordered on discharge. If Novolin 70/30 8 units bid is ordered would = approx. 11.2 units basal + 4.8 units meal coverage. Spoke with Lowry Ram RN and nurses plan to begin insulin teaching and allow patient to give own injections.  Thank you, Nani Gasser. Ameka Krigbaum, RN, MSN, CDE  Diabetes Coordinator Inpatient Glycemic Control Team Team Pager  [phone] (8am-5pm) 08/28/2018 1:36 PM

## 2018-08-29 LAB — URINE CULTURE
Culture: >=100000 — AB
Special Requests: NORMAL

## 2018-09-01 DIAGNOSIS — Z139 Encounter for screening, unspecified: Secondary | ICD-10-CM

## 2018-09-01 LAB — GLUCOSE, POCT (MANUAL RESULT ENTRY): POC Glucose: 329 mg/dl — AB (ref 70–99)

## 2018-09-01 NOTE — Congregational Nurse Program (Signed)
Dept: [phone]   Congregational Nurse Program Note  Date of Encounter: 09/01/2018  Past Medical History: Past Medical History:  Diagnosis Date  . Diabetes mellitus     Encounter Details: CNP Questionnaire - 09/01/18 1350      Questionnaire   Patient Status  Not Applicable    Race  White or Caucasian    Location Patient Served At  [facility]  Not Applicable    Uninsured  Uninsured (NEW 1x/quarter)    Food  No food insecurities    Housing/Utilities  Yes, have permanent housing    Transportation  No transportation needs    Interpersonal Safety  Yes, feel physically and emotionally safe where you currently live    Medication  Yes, have medication insecurities    Medical Provider  Yes    Referrals  Medication Assistance;Primary Care Provider/Clinic;[facility]    ED Visit Averted  Not Applicable    Life-Saving Intervention Made  Not Applicable      Pt presents to clinic for screening referral to obtain access to a PCP post-hospitalizatio at [facility].  She states she was recently released on 2.20.20.    She states he has a hx of diabetes and has been a diabetic since 2006, but has not taken any of her meds prior to her hospitalization due to lack of money and being uninsured.   States she has not seen a  PCP to manage her diabetes outside of going to the ER.     States she was given her diabetes and medication prescriptions and have picked them all up with the exception of the insulin (levemi)r due to their it's cost  Vital signs checked; BP(ABN); Random Blood glucos (ABN)- last ate 9:15am    No meds were taken on today, but was planning on taken medicines once getting at home.  Pt was advised to take BP and Diabetes meds as prescribed for today.  No A1C test was conducted due the hospital conducting one during her hospital visit.   Pt was referred to RN Nurse Case Managerment, Elfredia Nevins for follow up and to assist with receipt of obtaining  her insulin  [facility] enrollment was began by D.Boyd during today's visit, however not due to pt needing to submit additional documentation.    Upon pt dropping off documentation she will be setup with an appt by D.Luciana Axe at [facility], bc the [facility] until October 0981, due to pt  missing so many appts previously.                                                                                                                                                                                                                                                                                                                                                                                                                                                                                                                                                                                                                                                                                                                                                                                                                                                                                                                                                                                                                                                                                                                                                                                                                                                                                                                                                                                                                                                                                                                                                                                                                                                                                                                                                                                                                                                                                                                         )(

## 2018-10-28 ENCOUNTER — Encounter (HOSPITAL_COMMUNITY)
Admission: EM | Disposition: A | Discharge status: Home Health | Payer: Self-pay | Source: Home / Self Care | Attending: Orthopedic Surgery

## 2018-10-28 ENCOUNTER — Other Ambulatory Visit: Payer: Self-pay

## 2018-10-28 ENCOUNTER — Emergency Department (HOSPITAL_COMMUNITY): Payer: Self-pay | Admitting: Certified Registered"

## 2018-10-28 ENCOUNTER — Inpatient Hospital Stay (HOSPITAL_COMMUNITY): Payer: Self-pay

## 2018-10-28 ENCOUNTER — Inpatient Hospital Stay (HOSPITAL_COMMUNITY)
Admission: EM | Admit: 2018-10-28 | Discharge: 2018-11-04 | DRG: 481 | Disposition: A | Discharge status: Home Health | Payer: Self-pay | Attending: Orthopedic Surgery | Admitting: Orthopedic Surgery

## 2018-10-28 ENCOUNTER — Encounter (HOSPITAL_COMMUNITY): Payer: Self-pay | Admitting: Emergency Medicine

## 2018-10-28 ENCOUNTER — Emergency Department (HOSPITAL_COMMUNITY): Payer: Self-pay

## 2018-10-28 DIAGNOSIS — E1159 Type 2 diabetes mellitus with other circulatory complications: Secondary | ICD-10-CM

## 2018-10-28 DIAGNOSIS — E1169 Type 2 diabetes mellitus with other specified complication: Secondary | ICD-10-CM | POA: Diagnosis present

## 2018-10-28 DIAGNOSIS — K219 Gastro-esophageal reflux disease without esophagitis: Secondary | ICD-10-CM | POA: Diagnosis present

## 2018-10-28 DIAGNOSIS — I152 Hypertension secondary to endocrine disorders: Secondary | ICD-10-CM | POA: Diagnosis present

## 2018-10-28 DIAGNOSIS — E1165 Type 2 diabetes mellitus with hyperglycemia: Secondary | ICD-10-CM | POA: Diagnosis present

## 2018-10-28 DIAGNOSIS — Z419 Encounter for procedure for purposes other than remedying health state, unspecified: Secondary | ICD-10-CM

## 2018-10-28 DIAGNOSIS — R339 Retention of urine, unspecified: Secondary | ICD-10-CM | POA: Diagnosis not present

## 2018-10-28 DIAGNOSIS — W19XXXA Unspecified fall, initial encounter: Secondary | ICD-10-CM

## 2018-10-28 DIAGNOSIS — Z794 Long term (current) use of insulin: Secondary | ICD-10-CM

## 2018-10-28 DIAGNOSIS — D649 Anemia, unspecified: Secondary | ICD-10-CM | POA: Diagnosis present

## 2018-10-28 DIAGNOSIS — R208 Other disturbances of skin sensation: Secondary | ICD-10-CM | POA: Diagnosis present

## 2018-10-28 DIAGNOSIS — W010XXA Fall on same level from slipping, tripping and stumbling without subsequent striking against object, initial encounter: Secondary | ICD-10-CM | POA: Diagnosis present

## 2018-10-28 DIAGNOSIS — S72402A Unspecified fracture of lower end of left femur, initial encounter for closed fracture: Secondary | ICD-10-CM

## 2018-10-28 DIAGNOSIS — Z87891 Personal history of nicotine dependence: Secondary | ICD-10-CM

## 2018-10-28 DIAGNOSIS — Z8249 Family history of ischemic heart disease and other diseases of the circulatory system: Secondary | ICD-10-CM

## 2018-10-28 DIAGNOSIS — E1121 Type 2 diabetes mellitus with diabetic nephropathy: Secondary | ICD-10-CM | POA: Diagnosis present

## 2018-10-28 DIAGNOSIS — Z79899 Other long term (current) drug therapy: Secondary | ICD-10-CM

## 2018-10-28 DIAGNOSIS — S7292XA Unspecified fracture of left femur, initial encounter for closed fracture: Secondary | ICD-10-CM

## 2018-10-28 DIAGNOSIS — I10 Essential (primary) hypertension: Secondary | ICD-10-CM

## 2018-10-28 DIAGNOSIS — Z833 Family history of diabetes mellitus: Secondary | ICD-10-CM

## 2018-10-28 DIAGNOSIS — S72452A Displaced supracondylar fracture without intracondylar extension of lower end of left femur, initial encounter for closed fracture: Secondary | ICD-10-CM

## 2018-10-28 DIAGNOSIS — Z803 Family history of malignant neoplasm of breast: Secondary | ICD-10-CM

## 2018-10-28 DIAGNOSIS — S7290XA Unspecified fracture of unspecified femur, initial encounter for closed fracture: Secondary | ICD-10-CM

## 2018-10-28 DIAGNOSIS — Z808 Family history of malignant neoplasm of other organs or systems: Secondary | ICD-10-CM

## 2018-10-28 DIAGNOSIS — Z818 Family history of other mental and behavioral disorders: Secondary | ICD-10-CM

## 2018-10-28 DIAGNOSIS — R7989 Other specified abnormal findings of blood chemistry: Secondary | ICD-10-CM | POA: Diagnosis present

## 2018-10-28 DIAGNOSIS — D62 Acute posthemorrhagic anemia: Secondary | ICD-10-CM | POA: Diagnosis not present

## 2018-10-28 DIAGNOSIS — Z791 Long term (current) use of non-steroidal anti-inflammatories (NSAID): Secondary | ICD-10-CM

## 2018-10-28 DIAGNOSIS — S72462A Displaced supracondylar fracture with intracondylar extension of lower end of left femur, initial encounter for closed fracture: Principal | ICD-10-CM | POA: Diagnosis present

## 2018-10-28 DIAGNOSIS — E860 Dehydration: Secondary | ICD-10-CM | POA: Diagnosis present

## 2018-10-28 HISTORY — DX: Essential (primary) hypertension: I10

## 2018-10-28 HISTORY — PX: IM NAILING FEMORAL SHAFT RETROGRADE: SUR732

## 2018-10-28 HISTORY — DX: Unspecified cataract: H26.9

## 2018-10-28 HISTORY — PX: FEMUR IM NAIL: SHX1597

## 2018-10-28 HISTORY — DX: Unspecified fracture of left femur, initial encounter for closed fracture: S72.92XA

## 2018-10-28 HISTORY — DX: Gastro-esophageal reflux disease without esophagitis: K21.9

## 2018-10-28 LAB — BASIC METABOLIC PANEL
Anion gap: 7 (ref 5–15)
BUN: 17 mg/dL (ref 6–20)
CO2: 23 mmol/L (ref 22–32)
Calcium: 8.1 mg/dL — ABNORMAL LOW (ref 8.9–10.3)
Chloride: 104 mmol/L (ref 98–111)
Creatinine, Ser: 1.15 mg/dL — ABNORMAL HIGH (ref 0.44–1.00)
GFR calc Af Amer: >60 mL/min (ref >60)
GFR calc non Af Amer: 56 mL/min — ABNORMAL LOW (ref >60)
Glucose, Bld: 345 mg/dL — ABNORMAL HIGH (ref 70–99)
Potassium: 3.7 mmol/L (ref 3.5–5.1)
Sodium: 134 mmol/L — ABNORMAL LOW (ref 135–145)

## 2018-10-28 LAB — CBC WITH DIFFERENTIAL/PLATELET
Abs Immature Granulocytes: 0.07 10*3/uL (ref 0.00–0.07)
Basophils Absolute: 0.1 10*3/uL (ref 0.0–0.1)
Basophils Relative: 1 %
Eosinophils Absolute: 0.3 10*3/uL (ref 0.0–0.5)
Eosinophils Relative: 3 %
HCT: 30 % — ABNORMAL LOW (ref 36.0–46.0)
Hemoglobin: 10.8 g/dL — ABNORMAL LOW (ref 12.0–15.0)
Immature Granulocytes: 1 %
Lymphocytes Relative: 15 %
Lymphs Abs: 1.5 10*3/uL (ref 0.7–4.0)
MCH: 31.7 pg (ref 26.0–34.0)
MCHC: 36 g/dL (ref 30.0–36.0)
MCV: 88 fL (ref 80.0–100.0)
Monocytes Absolute: 0.7 10*3/uL (ref 0.1–1.0)
Monocytes Relative: 7 %
Neutro Abs: 7.5 10*3/uL (ref 1.7–7.7)
Neutrophils Relative %: 73 %
Platelets: 235 10*3/uL (ref 150–400)
RBC: 3.41 MIL/uL — ABNORMAL LOW (ref 3.87–5.11)
RDW: 11.8 % (ref 11.5–15.5)
WBC: 10.1 10*3/uL (ref 4.0–10.5)
nRBC: 0 % (ref 0.0–0.2)

## 2018-10-28 LAB — CBG MONITORING, ED
Glucose-Capillary: 315 mg/dL — ABNORMAL HIGH (ref 70–99)
Glucose-Capillary: 328 mg/dL — ABNORMAL HIGH (ref 70–99)

## 2018-10-28 LAB — POCT I-STAT 4, (NA,K, GLUC, HGB,HCT)
Glucose, Bld: 319 mg/dL — ABNORMAL HIGH (ref 70–99)
HCT: 23 % — ABNORMAL LOW (ref 36.0–46.0)
Hemoglobin: 7.8 g/dL — ABNORMAL LOW (ref 12.0–15.0)
Potassium: 3.8 mmol/L (ref 3.5–5.1)
Sodium: 137 mmol/L (ref 135–145)

## 2018-10-28 LAB — GLUCOSE, CAPILLARY
Glucose-Capillary: 328 mg/dL — ABNORMAL HIGH (ref 70–99)
Glucose-Capillary: 243 mg/dL — ABNORMAL HIGH (ref 70–99)
Glucose-Capillary: 221 mg/dL — ABNORMAL HIGH (ref 70–99)

## 2018-10-28 LAB — HEMOGLOBIN AND HEMATOCRIT, BLOOD
HCT: 27.4 % — ABNORMAL LOW (ref 36.0–46.0)
Hemoglobin: 9.8 g/dL — ABNORMAL LOW (ref 12.0–15.0)

## 2018-10-28 LAB — PREPARE RBC (CROSSMATCH)

## 2018-10-28 LAB — ABO/RH: ABO/RH(D): O NEG

## 2018-10-28 IMAGING — DX LEFT FEMUR PORTABLE 2 VIEWS
1 series · 4 of 4 positions shown · non-contrast
Comparison: Plain films earlier today.

CLINICAL DATA: Fall. Pain.

EXAM:
DG C-ARM 61-120 MIN; LEFT FEMUR PORTABLE 2 VIEWS; LEFT FEMUR 2 VIEWS

[Series 1: femur · 0.14mm/px · 4 of 4 slices shown]
[im 1/4]
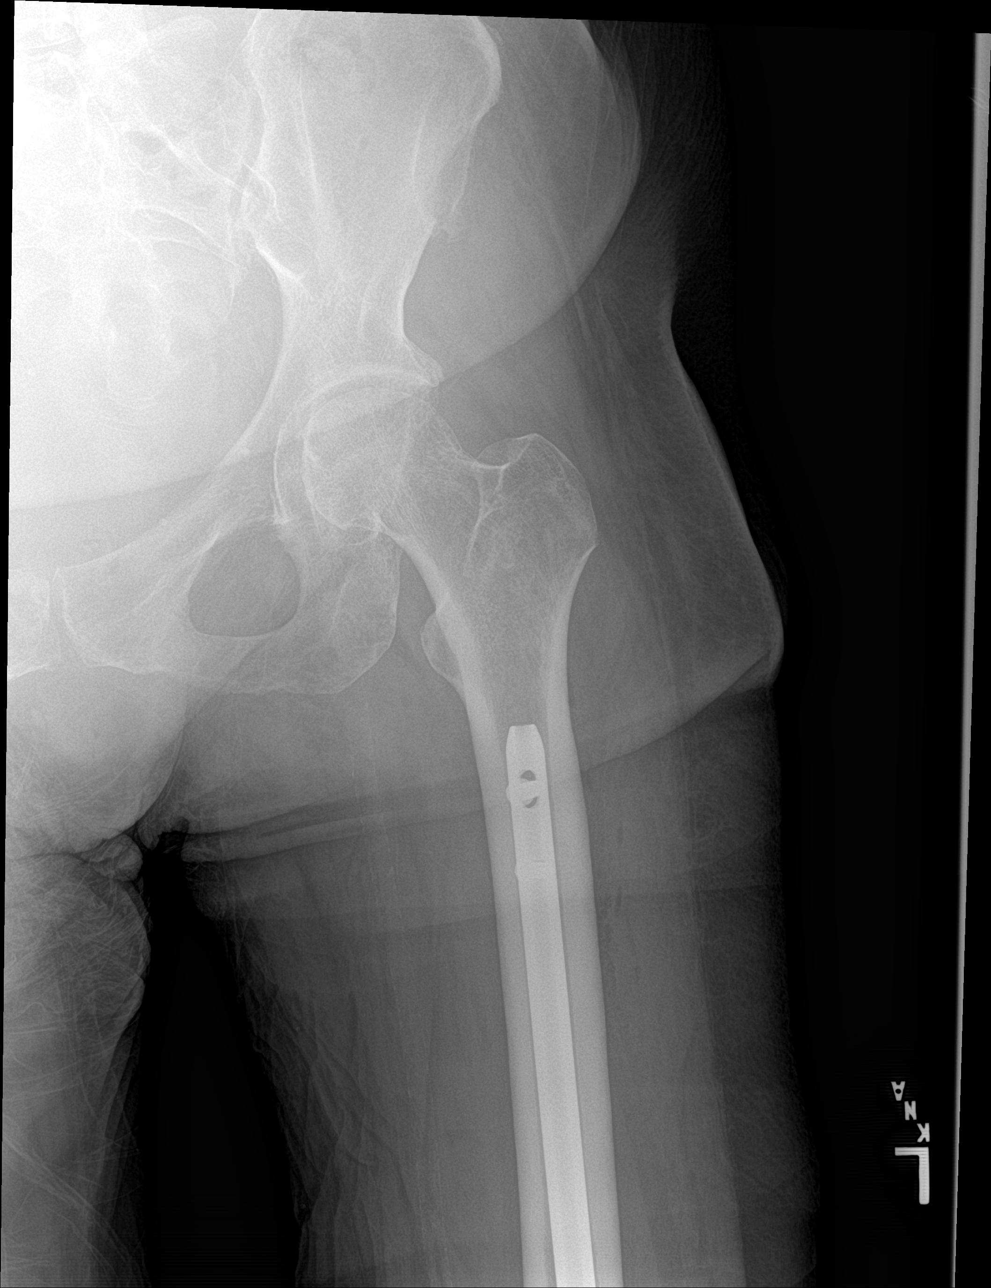
[im 2/4]
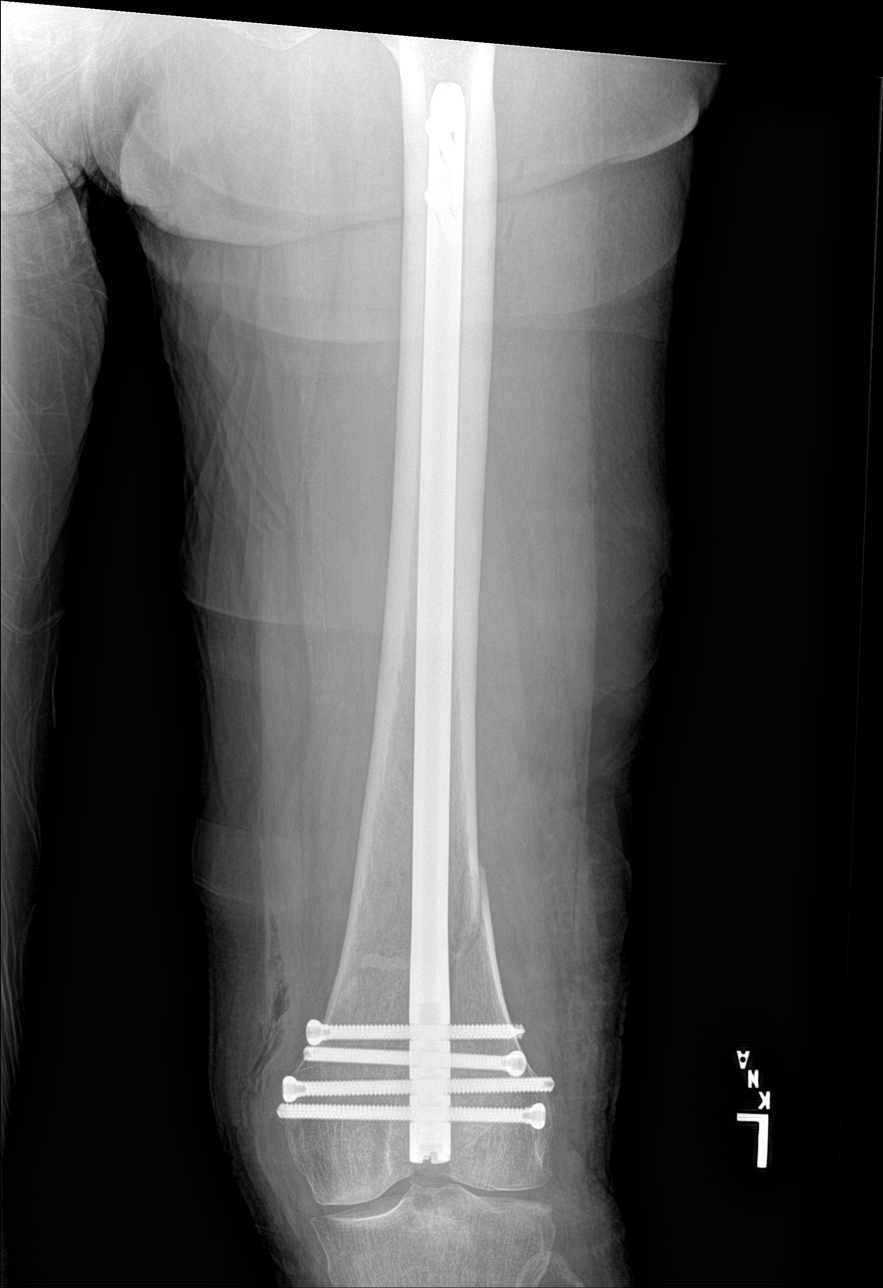
[im 3/4]
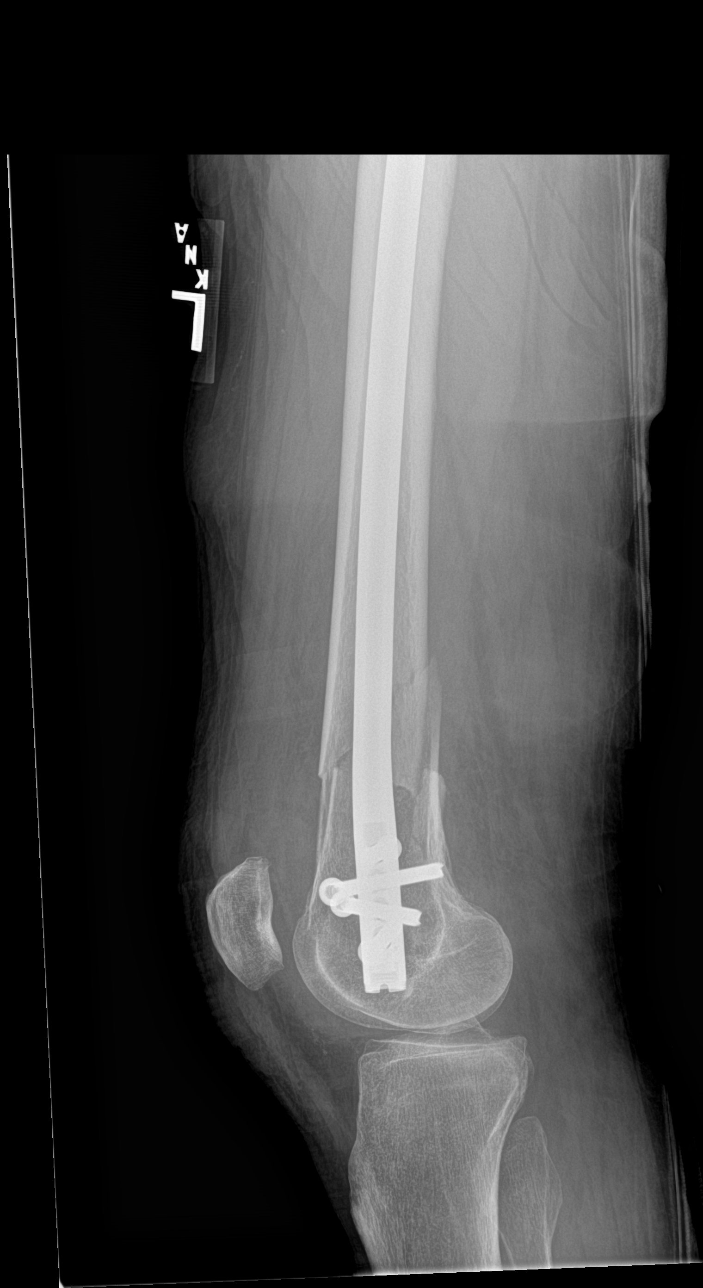
[im 4/4]
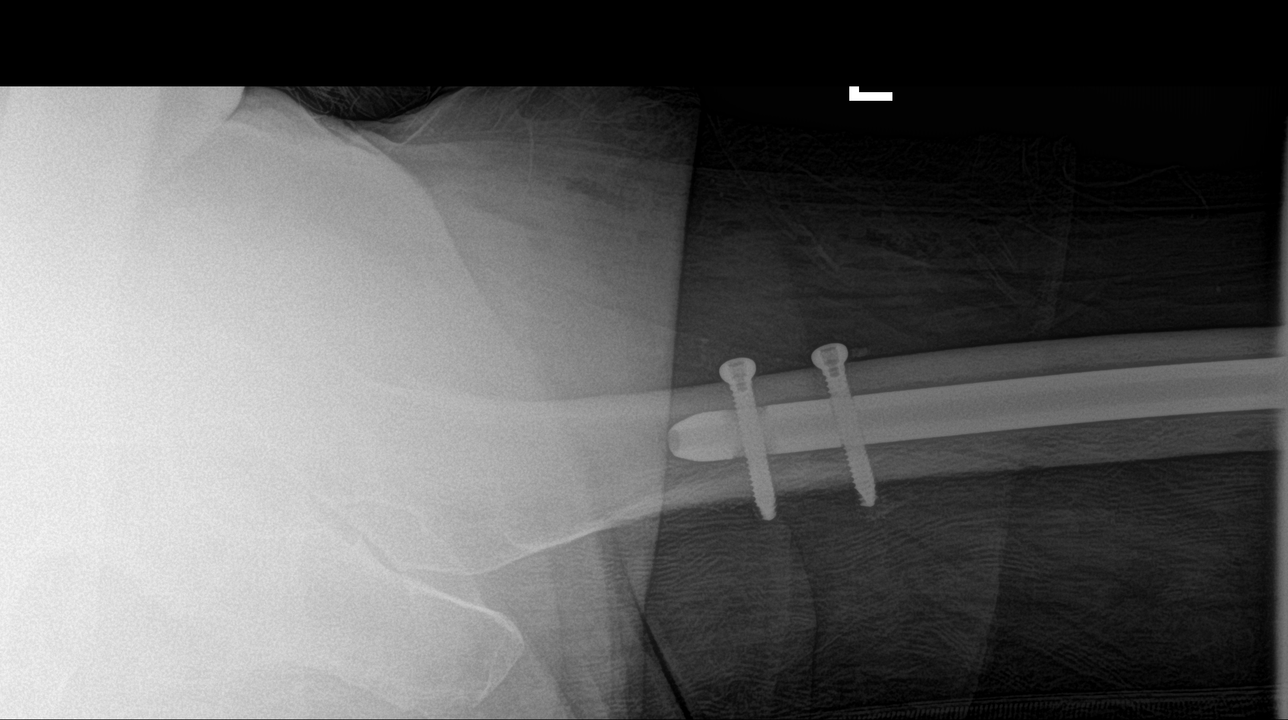

[4 of 4 positions shown; findings below may reference images not displayed]

FINDINGS: Intraoperative spot films demonstrate open reduction internal
fixation of a distal femur fracture. Intramedullary rod is been
anchored with by cortical screws.
IMPRESSION: As above.

Improved position and alignment.

## 2018-10-28 IMAGING — RF LEFT FEMUR 2 VIEWS
1 series · 6 of 6 positions shown · non-contrast
Comparison: Plain films earlier today.

CLINICAL DATA: Fall. Pain.

EXAM:
DG C-ARM 61-120 MIN; LEFT FEMUR PORTABLE 2 VIEWS; LEFT FEMUR 2 VIEWS

[Series 1: run · 6 of 6 slices shown]
[im 1/6]
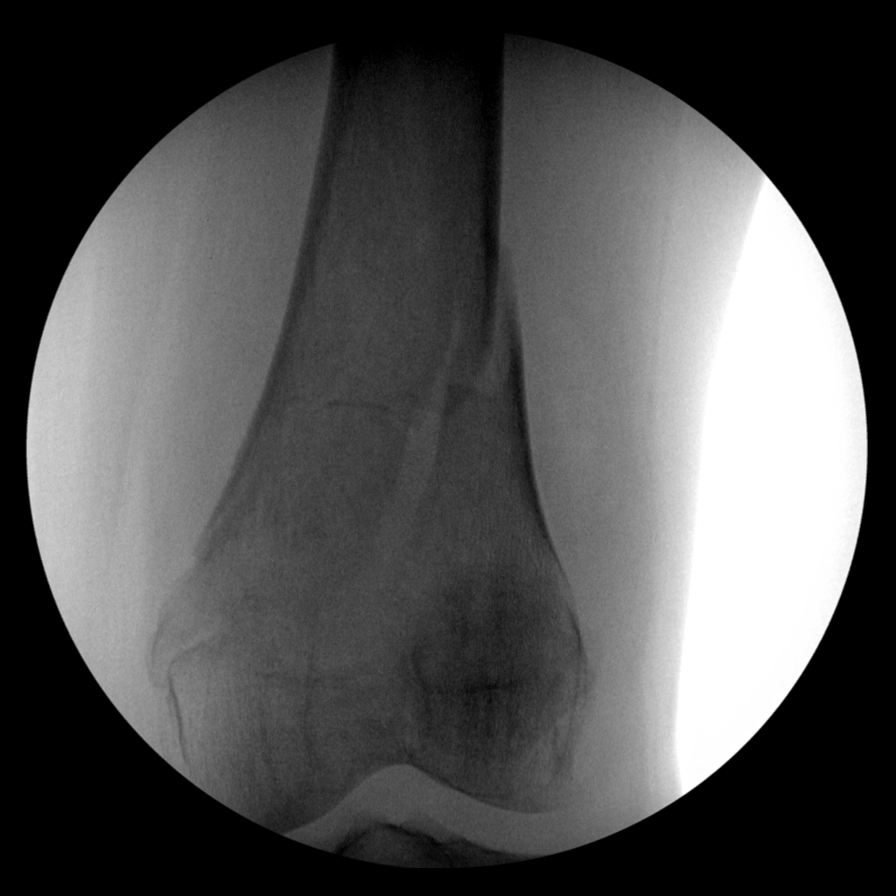
[im 2/6]
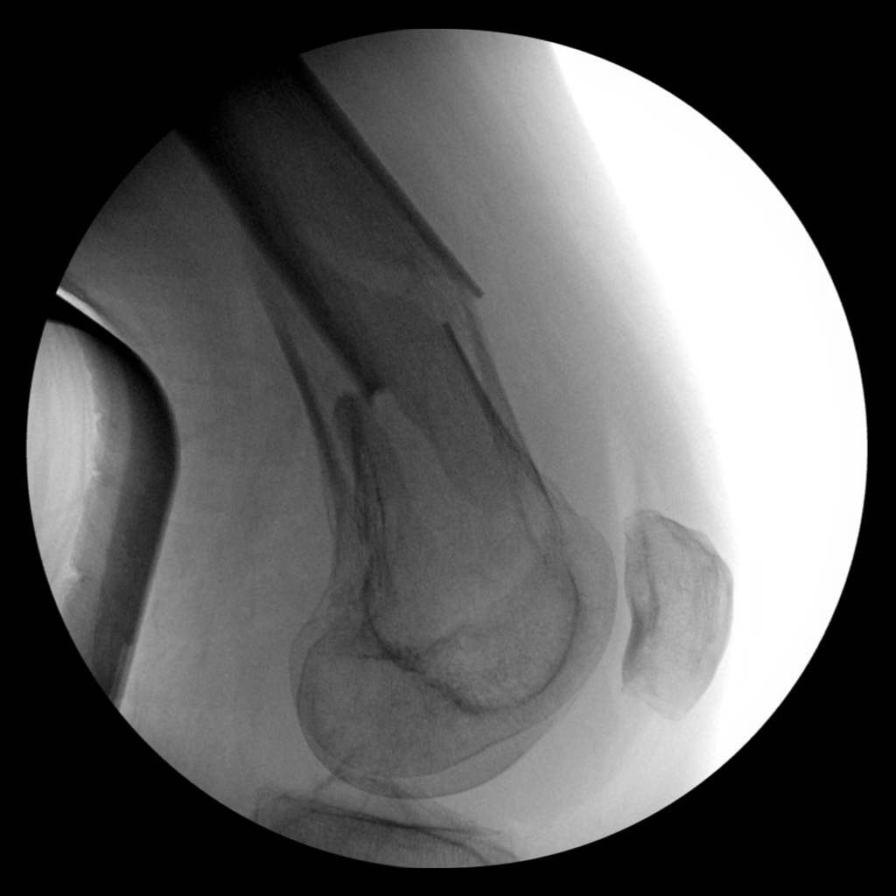
[im 3/6]
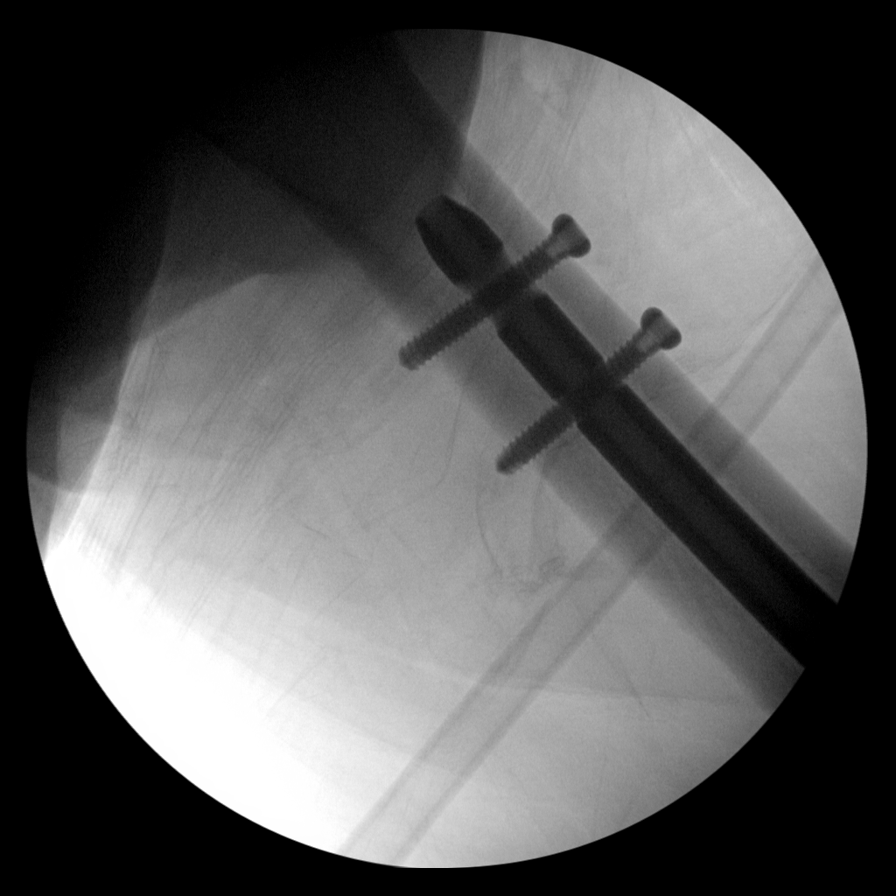
[im 4/6]
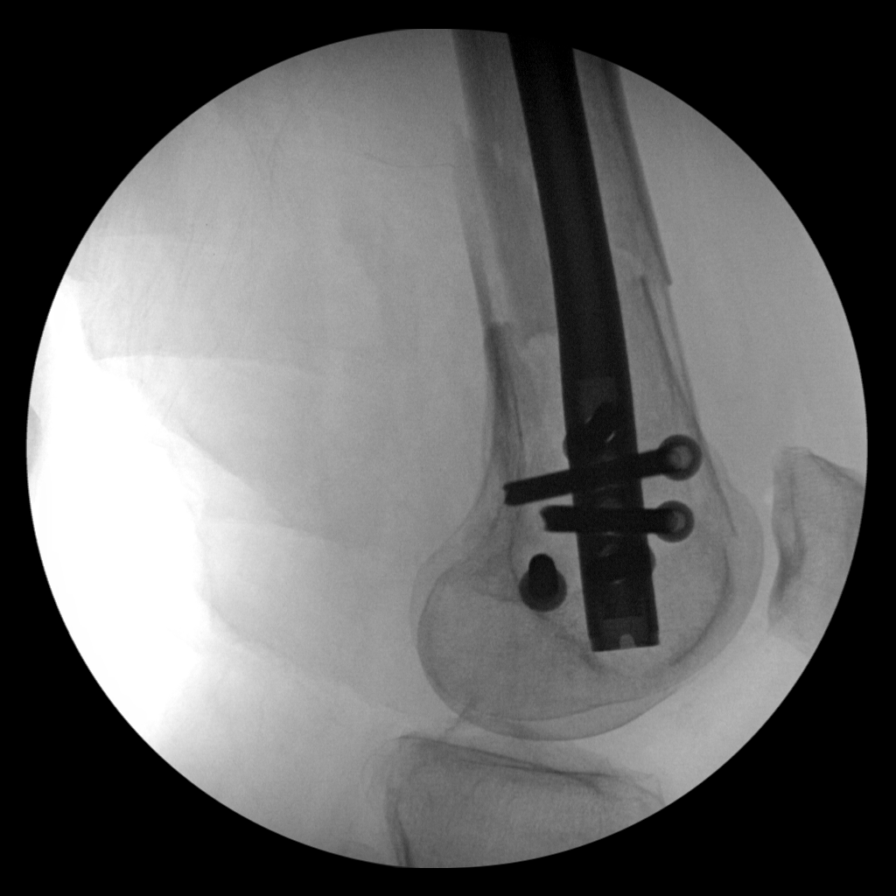
[im 5/6]
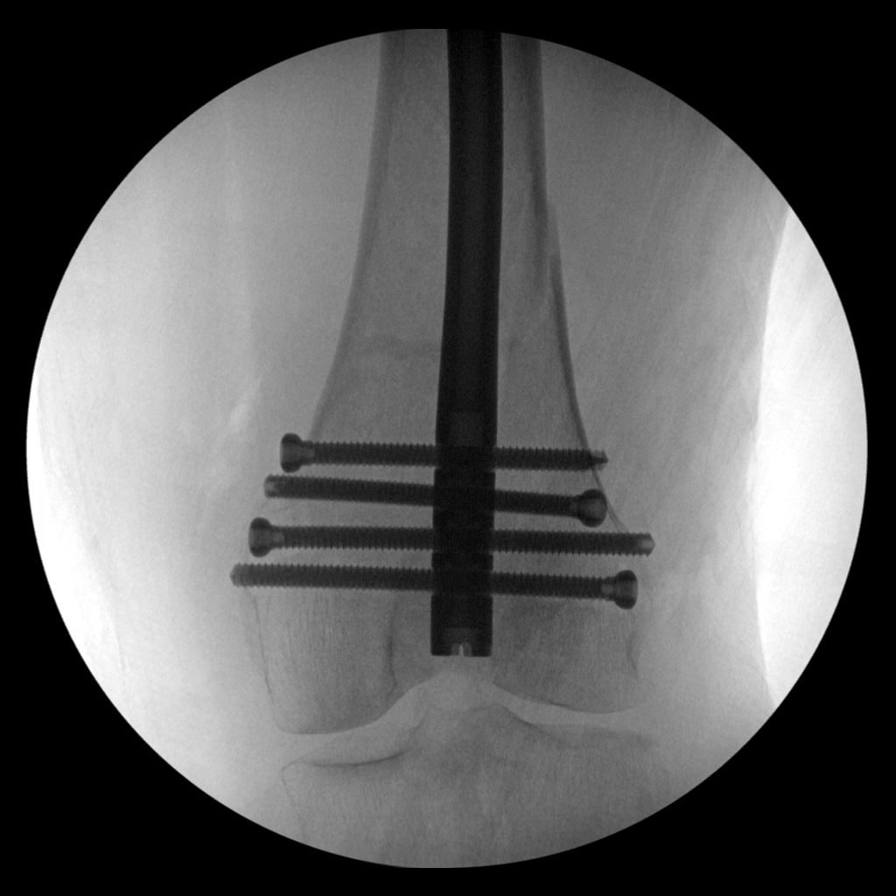
[im 6/6]
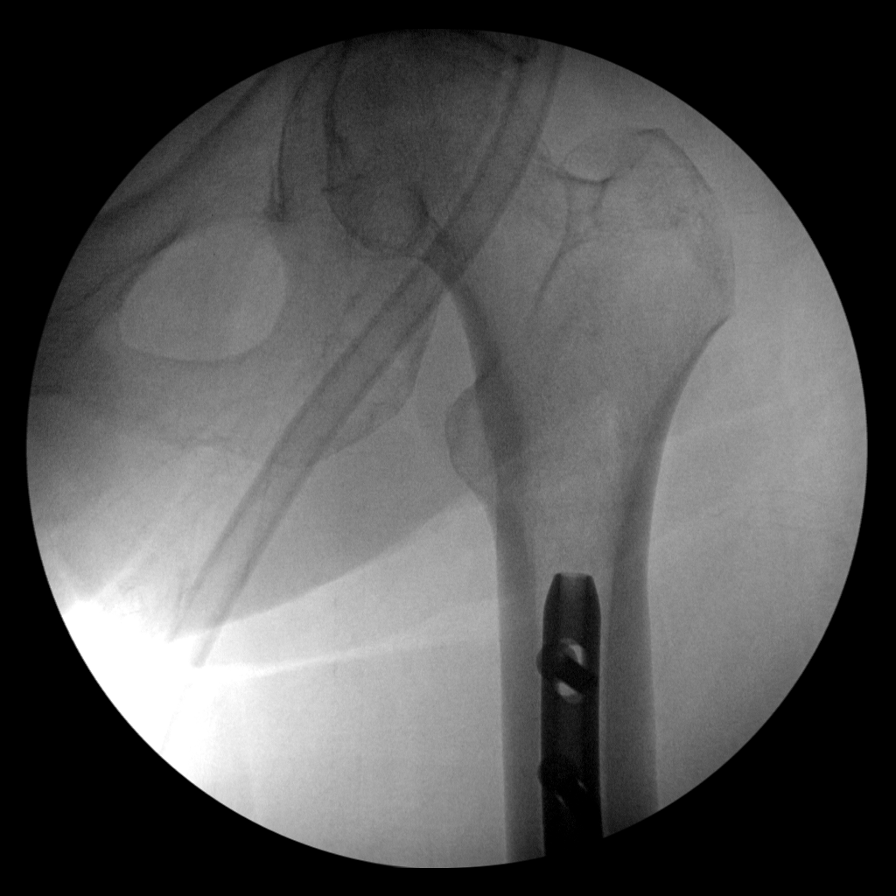

[6 of 6 positions shown; findings below may reference images not displayed]

FINDINGS: Intraoperative spot films demonstrate open reduction internal
fixation of a distal femur fracture. Intramedullary rod is been
anchored with by cortical screws.
IMPRESSION: As above.

Improved position and alignment.

## 2018-10-28 IMAGING — DX LEFT KNEE - 1-2 VIEW
2 series · 2 of 2 positions shown · non-contrast
Comparison: [DATE]

CLINICAL DATA: Fall. Pain.

EXAM:
LEFT KNEE - 1-2 VIEW

[knee ap]
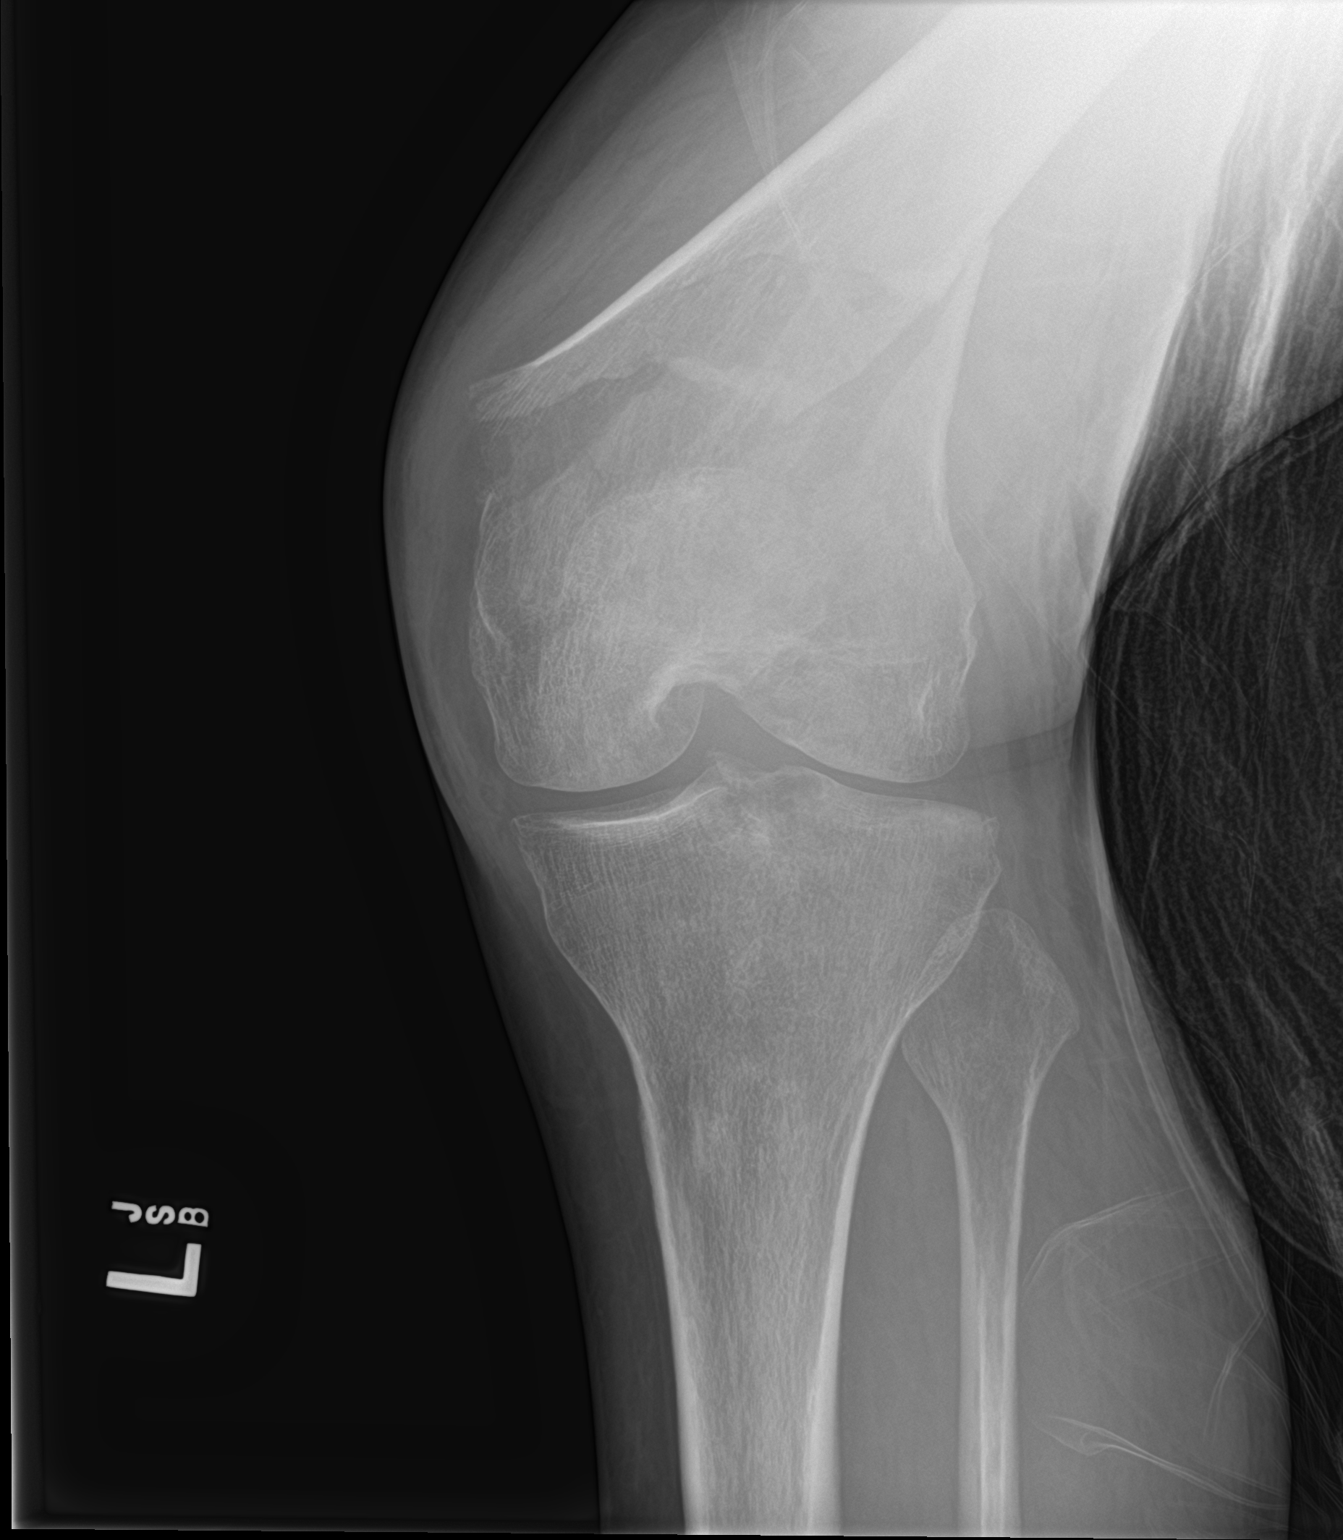

[knee lat]
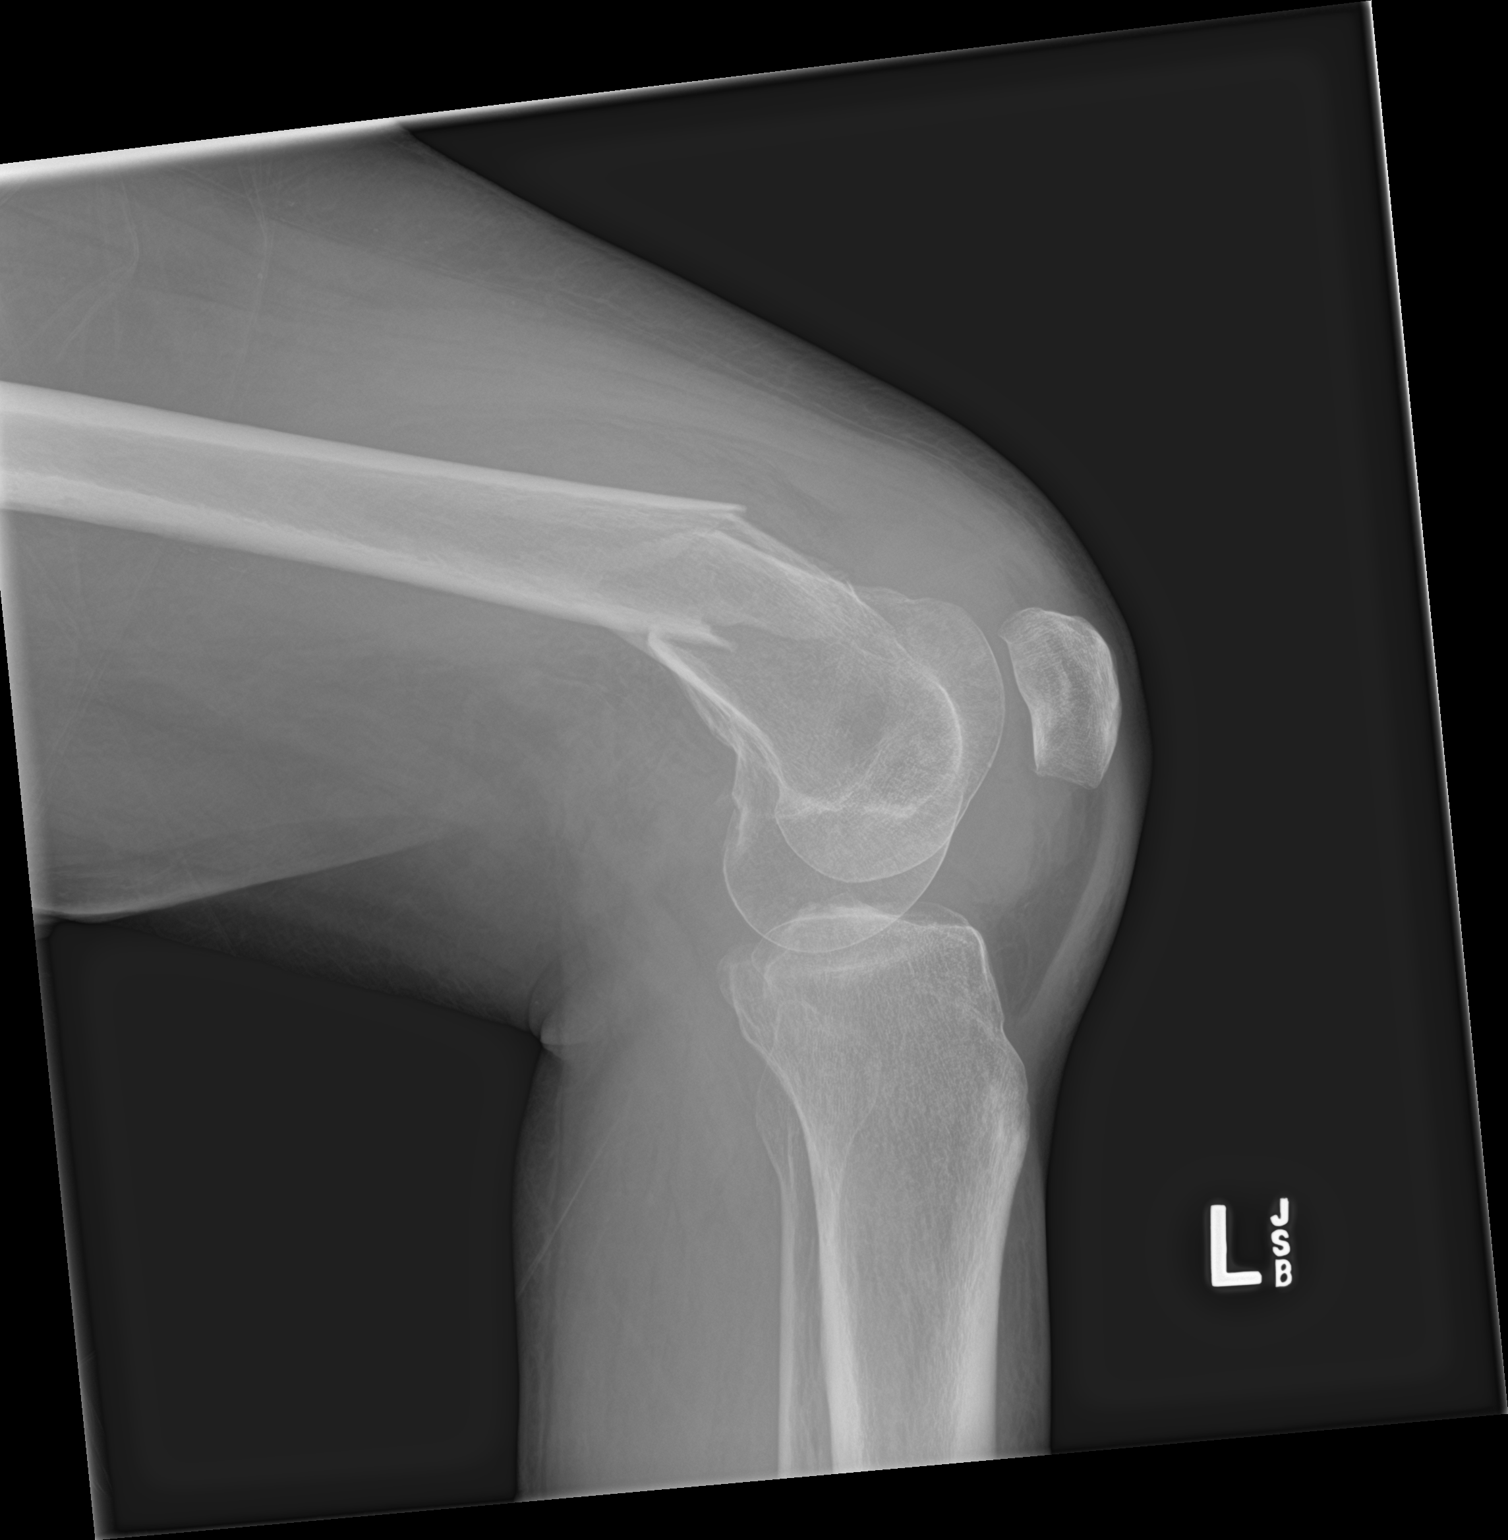

[2 of 2 positions shown; findings below may reference images not displayed]

FINDINGS: There is an oblique angulated fracture of the distal femur,
supracondylar location. Soft tissue swelling. Positive for
suprapatellar bursa effusion. No knee dislocation.
IMPRESSION: Oblique angulated supracondylar fracture of the distal femur.

## 2018-10-28 SURGERY — INSERTION, INTRAMEDULLARY ROD, FEMUR, RETROGRADE
Anesthesia: General | Site: Leg Upper | Laterality: Left

## 2018-10-28 MED ORDER — METHOCARBAMOL 1000 MG/10ML IJ SOLN
500.0000 mg | Freq: Four times a day (QID) | INTRAVENOUS | Status: DC | PRN
  Filled 2018-10-28: qty 5

## 2018-10-28 MED ORDER — METHOCARBAMOL 500 MG PO TABS
500.0000 mg | ORAL_TABLET | Freq: Four times a day (QID) | ORAL | Status: DC | PRN
  Administered 2018-10-28 – 2018-11-03 (×7): 500 mg via ORAL
  Filled 2018-10-28 (×7): qty 1

## 2018-10-28 MED ORDER — INSULIN ASPART 100 UNIT/ML ~~LOC~~ SOLN
0.0000 [IU] | Freq: Three times a day (TID) | SUBCUTANEOUS | Status: DC
  Administered 2018-10-29: 14:00:00 1 [IU] via SUBCUTANEOUS
  Administered 2018-10-29: 2 [IU] via SUBCUTANEOUS
  Administered 2018-10-30: 18:00:00 3 [IU] via SUBCUTANEOUS
  Administered 2018-10-30: 12:00:00 2 [IU] via SUBCUTANEOUS

## 2018-10-28 MED ORDER — METOCLOPRAMIDE HCL 5 MG PO TABS: 5.0000 mg | ORAL_TABLET | Freq: Three times a day (TID) | ORAL | Status: DC | PRN

## 2018-10-28 MED ORDER — SODIUM CHLORIDE 0.9% IV SOLUTION: Freq: Once | INTRAVENOUS | Status: DC

## 2018-10-28 MED ORDER — SODIUM CHLORIDE 0.9 % IV SOLN
INTRAVENOUS | Status: DC | PRN
  Administered 2018-10-28: 17:00:00 2 [IU]/h via INTRAVENOUS

## 2018-10-28 MED ORDER — POVIDONE-IODINE 10 % EX SWAB: 2.0000 "application " | Freq: Once | CUTANEOUS | Status: DC

## 2018-10-28 MED ORDER — ONDANSETRON HCL 4 MG PO TABS: 4.0000 mg | ORAL_TABLET | Freq: Four times a day (QID) | ORAL | Status: DC | PRN

## 2018-10-28 MED ORDER — LACTATED RINGERS IV SOLN
INTRAVENOUS | Status: DC
  Administered 2018-10-28 (×2): via INTRAVENOUS

## 2018-10-28 MED ORDER — INSULIN REGULAR(HUMAN) IN NACL 100-0.9 UT/100ML-% IV SOLN
INTRAVENOUS | Status: DC
  Filled 2018-10-28 (×2): qty 100

## 2018-10-28 MED ORDER — 0.9 % SODIUM CHLORIDE (POUR BTL) OPTIME
TOPICAL | Status: DC | PRN
  Administered 2018-10-28: 1000 mL

## 2018-10-28 MED ORDER — MORPHINE SULFATE (PF) 2 MG/ML IV SOLN
2.0000 mg | INTRAVENOUS | Status: DC | PRN
  Administered 2018-10-29 – 2018-11-02 (×4): 2 mg via INTRAVENOUS
  Filled 2018-10-28 (×4): qty 1

## 2018-10-28 MED ORDER — BUPIVACAINE HCL (PF) 0.25 % IJ SOLN
INTRAMUSCULAR | Status: AC
  Filled 2018-10-28: qty 30

## 2018-10-28 MED ORDER — HYDROMORPHONE HCL 1 MG/ML IJ SOLN
1.0000 mg | Freq: Once | INTRAMUSCULAR | Status: AC
  Administered 2018-10-28: 13:00:00 1 mg via INTRAMUSCULAR
  Filled 2018-10-28: qty 1

## 2018-10-28 MED ORDER — SODIUM CHLORIDE 0.9 % IV BOLUS
500.0000 mL | Freq: Once | INTRAVENOUS | Status: AC
  Administered 2018-10-28: 500 mL via INTRAVENOUS

## 2018-10-28 MED ORDER — ONDANSETRON HCL 4 MG/2ML IJ SOLN
4.0000 mg | Freq: Four times a day (QID) | INTRAMUSCULAR | Status: DC | PRN
  Administered 2018-10-29 – 2018-11-03 (×3): 4 mg via INTRAVENOUS
  Filled 2018-10-28 (×3): qty 2

## 2018-10-28 MED ORDER — METOCLOPRAMIDE HCL 5 MG/ML IJ SOLN
5.0000 mg | Freq: Three times a day (TID) | INTRAMUSCULAR | Status: DC | PRN
  Administered 2018-10-29: 06:00:00 10 mg via INTRAVENOUS
  Filled 2018-10-28 (×2): qty 2

## 2018-10-28 MED ORDER — METFORMIN HCL 500 MG PO TABS: 500.0000 mg | ORAL_TABLET | Freq: Two times a day (BID) | ORAL | Status: DC

## 2018-10-28 MED ORDER — INSULIN DETEMIR 100 UNIT/ML ~~LOC~~ SOLN
15.0000 [IU] | Freq: Every day | SUBCUTANEOUS | Status: DC
  Filled 2018-10-28: qty 0.15

## 2018-10-28 MED ORDER — ONDANSETRON HCL 4 MG/2ML IJ SOLN
INTRAMUSCULAR | Status: DC | PRN
  Administered 2018-10-28: 4 mg via INTRAVENOUS

## 2018-10-28 MED ORDER — OXYCODONE HCL 5 MG PO TABS: 5.0000 mg | ORAL_TABLET | ORAL | Status: DC | PRN

## 2018-10-28 MED ORDER — LACTATED RINGERS IV SOLN
INTRAVENOUS | Status: AC
  Administered 2018-10-28 – 2018-10-30 (×3): via INTRAVENOUS

## 2018-10-28 MED ORDER — OXYCODONE HCL 5 MG PO TABS
5.0000 mg | ORAL_TABLET | ORAL | Status: DC | PRN
  Administered 2018-10-28: 5 mg via ORAL
  Administered 2018-10-29: 03:00:00 10 mg via ORAL
  Filled 2018-10-28 (×3): qty 1

## 2018-10-28 MED ORDER — CLONIDINE HCL (ANALGESIA) 100 MCG/ML EP SOLN
EPIDURAL | Status: DC | PRN
  Administered 2018-10-28: 1 mL

## 2018-10-28 MED ORDER — PROPOFOL 10 MG/ML IV BOLUS
INTRAVENOUS | Status: DC | PRN
  Administered 2018-10-28: 90 mg via INTRAVENOUS

## 2018-10-28 MED ORDER — LISINOPRIL 5 MG PO TABS
5.0000 mg | ORAL_TABLET | Freq: Every day | ORAL | Status: DC
  Administered 2018-10-29 – 2018-10-30 (×2): 5 mg via ORAL
  Filled 2018-10-28 (×2): qty 1

## 2018-10-28 MED ORDER — ACETAMINOPHEN 10 MG/ML IV SOLN
INTRAVENOUS | Status: AC
  Filled 2018-10-28: qty 100

## 2018-10-28 MED ORDER — CLONIDINE HCL (ANALGESIA) 100 MCG/ML EP SOLN
150.0000 ug | EPIDURAL | Status: DC
  Filled 2018-10-28: qty 10

## 2018-10-28 MED ORDER — HYDRALAZINE HCL 20 MG/ML IJ SOLN: 5.0000 mg | INTRAMUSCULAR | Status: DC | PRN

## 2018-10-28 MED ORDER — ASPIRIN EC 81 MG PO TBEC
81.0000 mg | DELAYED_RELEASE_TABLET | Freq: Two times a day (BID) | ORAL | Status: DC
  Administered 2018-10-28 – 2018-11-04 (×13): 81 mg via ORAL
  Filled 2018-10-28 (×14): qty 1

## 2018-10-28 MED ORDER — CHLORHEXIDINE GLUCONATE 4 % EX LIQD: 60.0000 mL | Freq: Once | CUTANEOUS | Status: DC

## 2018-10-28 MED ORDER — FENTANYL CITRATE (PF) 100 MCG/2ML IJ SOLN
25.0000 ug | INTRAMUSCULAR | Status: DC | PRN
  Administered 2018-10-28 (×2): 25 ug via INTRAVENOUS

## 2018-10-28 MED ORDER — BUPIVACAINE HCL (PF) 0.25 % IJ SOLN
INTRAMUSCULAR | Status: DC | PRN
  Administered 2018-10-28: 20 mL

## 2018-10-28 MED ORDER — ACETAMINOPHEN 500 MG PO TABS: 1000.0000 mg | ORAL_TABLET | Freq: Once | ORAL | Status: DC | PRN

## 2018-10-28 MED ORDER — INSULIN ASPART 100 UNIT/ML ~~LOC~~ SOLN
0.0000 [IU] | Freq: Every day | SUBCUTANEOUS | Status: DC
  Administered 2018-10-28: 21:00:00 2 [IU] via SUBCUTANEOUS
  Administered 2018-10-29: 1 [IU] via SUBCUTANEOUS

## 2018-10-28 MED ORDER — HYDROMORPHONE HCL 1 MG/ML IJ SOLN
0.5000 mg | INTRAMUSCULAR | Status: DC | PRN
  Administered 2018-10-29 – 2018-11-01 (×2): 0.5 mg via INTRAVENOUS
  Filled 2018-10-28 (×2): qty 1

## 2018-10-28 MED ORDER — ONDANSETRON HCL 4 MG/2ML IJ SOLN: 4.0000 mg | Freq: Four times a day (QID) | INTRAMUSCULAR | Status: DC | PRN

## 2018-10-28 MED ORDER — INSULIN DETEMIR 100 UNIT/ML ~~LOC~~ SOLN
15.0000 [IU] | Freq: Every day | SUBCUTANEOUS | Status: DC
  Administered 2018-10-28 – 2018-10-31 (×4): 15 [IU] via SUBCUTANEOUS
  Filled 2018-10-28 (×7): qty 0.15

## 2018-10-28 MED ORDER — SUCCINYLCHOLINE CHLORIDE 200 MG/10ML IV SOSY
PREFILLED_SYRINGE | INTRAVENOUS | Status: DC | PRN
  Administered 2018-10-28: 80 mg via INTRAVENOUS

## 2018-10-28 MED ORDER — ACETAMINOPHEN 160 MG/5ML PO SOLN: 1000.0000 mg | Freq: Once | ORAL | Status: DC | PRN

## 2018-10-28 MED ORDER — DOCUSATE SODIUM 100 MG PO CAPS
100.0000 mg | ORAL_CAPSULE | Freq: Two times a day (BID) | ORAL | Status: DC
  Administered 2018-10-28 – 2018-11-02 (×9): 100 mg via ORAL
  Filled 2018-10-28 (×10): qty 1

## 2018-10-28 MED ORDER — ALBUMIN HUMAN 5 % IV SOLN
INTRAVENOUS | Status: DC | PRN
  Administered 2018-10-28: 18:00:00 via INTRAVENOUS

## 2018-10-28 MED ORDER — LIDOCAINE HCL (CARDIAC) PF 100 MG/5ML IV SOSY
PREFILLED_SYRINGE | INTRAVENOUS | Status: DC | PRN
  Administered 2018-10-28: 60 mg via INTRAVENOUS

## 2018-10-28 MED ORDER — MORPHINE SULFATE (PF) 4 MG/ML IV SOLN
INTRAVENOUS | Status: AC
  Filled 2018-10-28: qty 2

## 2018-10-28 MED ORDER — CEFAZOLIN SODIUM-DEXTROSE 2-4 GM/100ML-% IV SOLN
2.0000 g | Freq: Four times a day (QID) | INTRAVENOUS | Status: AC
  Administered 2018-10-28 – 2018-10-29 (×2): 2 g via INTRAVENOUS
  Filled 2018-10-28 (×2): qty 100

## 2018-10-28 MED ORDER — CEFAZOLIN SODIUM-DEXTROSE 2-4 GM/100ML-% IV SOLN
INTRAVENOUS | Status: AC
  Filled 2018-10-28: qty 100

## 2018-10-28 MED ORDER — ACETAMINOPHEN 10 MG/ML IV SOLN
1000.0000 mg | Freq: Once | INTRAVENOUS | Status: DC | PRN
  Administered 2018-10-28: 1000 mg via INTRAVENOUS

## 2018-10-28 MED ORDER — ONDANSETRON HCL 4 MG PO TABS
4.0000 mg | ORAL_TABLET | Freq: Four times a day (QID) | ORAL | Status: DC | PRN
  Administered 2018-11-01: 09:00:00 4 mg via ORAL
  Filled 2018-10-28: qty 1

## 2018-10-28 MED ORDER — HYDROMORPHONE HCL 1 MG/ML IJ SOLN
1.0000 mg | INTRAMUSCULAR | Status: DC | PRN
  Administered 2018-10-28: 13:00:00 1 mg via INTRAVENOUS
  Filled 2018-10-28 (×2): qty 1

## 2018-10-28 MED ORDER — SUGAMMADEX SODIUM 200 MG/2ML IV SOLN
INTRAVENOUS | Status: DC | PRN
  Administered 2018-10-28: 150 mg via INTRAVENOUS

## 2018-10-28 MED ORDER — FENTANYL CITRATE (PF) 100 MCG/2ML IJ SOLN
INTRAMUSCULAR | Status: AC
  Filled 2018-10-28: qty 2

## 2018-10-28 MED ORDER — OXYCODONE HCL 5 MG/5ML PO SOLN: 5.0000 mg | Freq: Once | ORAL | Status: DC | PRN

## 2018-10-28 MED ORDER — FENTANYL CITRATE (PF) 100 MCG/2ML IJ SOLN
INTRAMUSCULAR | Status: DC | PRN
  Administered 2018-10-28: 150 ug via INTRAVENOUS
  Administered 2018-10-28 (×2): 50 ug via INTRAVENOUS

## 2018-10-28 MED ORDER — HYDROMORPHONE HCL 1 MG/ML IJ SOLN
1.0000 mg | Freq: Once | INTRAMUSCULAR | Status: AC
  Administered 2018-10-28: 14:00:00 1 mg via INTRAVENOUS

## 2018-10-28 MED ORDER — ENOXAPARIN SODIUM 40 MG/0.4ML ~~LOC~~ SOLN: 40.0000 mg | SUBCUTANEOUS | Status: DC

## 2018-10-28 MED ORDER — ACETAMINOPHEN 325 MG PO TABS
650.0000 mg | ORAL_TABLET | Freq: Four times a day (QID) | ORAL | Status: DC | PRN
  Administered 2018-10-30 – 2018-11-02 (×2): 650 mg via ORAL
  Filled 2018-10-28 (×2): qty 2

## 2018-10-28 MED ORDER — CEFAZOLIN SODIUM-DEXTROSE 2-4 GM/100ML-% IV SOLN
2.0000 g | INTRAVENOUS | Status: AC
  Administered 2018-10-28: 2 g via INTRAVENOUS

## 2018-10-28 MED ORDER — MORPHINE SULFATE (PF) 4 MG/ML IV SOLN
INTRAVENOUS | Status: DC | PRN
  Administered 2018-10-28: 8 mg

## 2018-10-28 MED ORDER — KCL IN DEXTROSE-NACL 20-5-0.45 MEQ/L-%-% IV SOLN
INTRAVENOUS | Status: DC
  Filled 2018-10-28: qty 1000

## 2018-10-28 MED ORDER — OXYCODONE HCL 5 MG PO TABS: 5.0000 mg | ORAL_TABLET | Freq: Once | ORAL | Status: DC | PRN

## 2018-10-28 MED ORDER — SODIUM CHLORIDE 0.9 % IV SOLN: 10.0000 mL/h | Freq: Once | INTRAVENOUS | Status: DC

## 2018-10-28 SURGICAL SUPPLY — 71 items
BANDAGE ACE 4X5 VEL STRL LF (GAUZE/BANDAGES/DRESSINGS) ×3 IMPLANT
BANDAGE ACE 6X5 VEL STRL LF (GAUZE/BANDAGES/DRESSINGS) ×3 IMPLANT
BANDAGE ESMARK 6X9 LF (GAUZE/BANDAGES/DRESSINGS) IMPLANT
BIT DRILL CALIBRATED 4.3MMX365 (DRILL) ×1 IMPLANT
BIT DRILL CROWE PNT TWST 4.5MM (DRILL) ×2 IMPLANT
BLADE CLIPPER SURG (BLADE) IMPLANT
BLADE SURG 15 STRL LF DISP TIS (BLADE) ×1 IMPLANT
BLADE SURG 15 STRL SS (BLADE) ×3
BNDG CMPR 9X6 STRL LF SNTH (GAUZE/BANDAGES/DRESSINGS)
BNDG CMPR MED 10X6 ELC LF (GAUZE/BANDAGES/DRESSINGS) ×1
BNDG COHESIVE 6X5 TAN STRL LF (GAUZE/BANDAGES/DRESSINGS) ×3 IMPLANT
BNDG ELASTIC 6X10 VLCR STRL LF (GAUZE/BANDAGES/DRESSINGS) ×3 IMPLANT
BNDG ESMARK 6X9 LF (GAUZE/BANDAGES/DRESSINGS)
BNDG GAUZE ELAST 4 BULKY (GAUZE/BANDAGES/DRESSINGS) ×3 IMPLANT
COVER WAND RF STERILE (DRAPES) ×3 IMPLANT
CUFF TOURNIQUET SINGLE 34IN LL (TOURNIQUET CUFF) IMPLANT
CUFF TOURNIQUET SINGLE 44IN (TOURNIQUET CUFF) IMPLANT
DECANTER SPIKE VIAL GLASS SM (MISCELLANEOUS) ×3 IMPLANT
DRAPE C-ARM 42X72 X-RAY (DRAPES) ×3 IMPLANT
DRAPE HALF SHEET 40X57 (DRAPES) ×6 IMPLANT
DRAPE IMP U-DRAPE 54X76 (DRAPES) ×3 IMPLANT
DRAPE INCISE IOBAN 66X45 STRL (DRAPES) ×6 IMPLANT
DRAPE ORTHO SPLIT 77X108 STRL (DRAPES) ×6
DRAPE SURG ORHT 6 SPLT 77X108 (DRAPES) ×2 IMPLANT
DRAPE U-SHAPE 47X51 STRL (DRAPES) ×3 IMPLANT
DRILL CALIBRATED 4.3MMX365 (DRILL) ×3
DRILL CROWE POINT TWIST 4.5MM (DRILL) ×6
DRSG AQUACEL AG ADV 3.5X 6 (GAUZE/BANDAGES/DRESSINGS) ×12 IMPLANT
DURAPREP 26ML APPLICATOR (WOUND CARE) ×3 IMPLANT
ELECT REM PT RETURN 9FT ADLT (ELECTROSURGICAL) ×3
ELECTRODE REM PT RTRN 9FT ADLT (ELECTROSURGICAL) ×1 IMPLANT
GAUZE SPONGE 4X4 12PLY STRL (GAUZE/BANDAGES/DRESSINGS) ×6 IMPLANT
GAUZE XEROFORM 1X8 LF (GAUZE/BANDAGES/DRESSINGS) ×3 IMPLANT
GLOVE BIO SURGEON ST LM GN SZ9 (GLOVE) ×3 IMPLANT
GLOVE BIOGEL PI IND STRL 8 (GLOVE) ×1 IMPLANT
GLOVE BIOGEL PI INDICATOR 8 (GLOVE) ×2
GLOVE SURG ORTHO 8.0 STRL STRW (GLOVE) ×3 IMPLANT
GOWN STRL REUS W/ TWL LRG LVL3 (GOWN DISPOSABLE) ×2 IMPLANT
GOWN STRL REUS W/ TWL XL LVL3 (GOWN DISPOSABLE) ×1 IMPLANT
GOWN STRL REUS W/TWL LRG LVL3 (GOWN DISPOSABLE) ×6
GOWN STRL REUS W/TWL XL LVL3 (GOWN DISPOSABLE) ×3
GUIDEPIN 3.2X17.5 THRD DISP (PIN) ×3 IMPLANT
GUIDEWIRE BEAD TIP (WIRE) ×3 IMPLANT
IMMOBILIZER KNEE 22 UNIV (SOFTGOODS) ×3 IMPLANT
KIT BASIN OR (CUSTOM PROCEDURE TRAY) ×3 IMPLANT
KIT TURNOVER KIT B (KITS) ×3 IMPLANT
MANIFOLD NEPTUNE II (INSTRUMENTS) ×3 IMPLANT
NAIL FEM RETRO 10.5X320 (Nail) ×3 IMPLANT
NEEDLE 22X1 1/2 (OR ONLY) (NEEDLE) ×3 IMPLANT
NS IRRIG 1000ML POUR BTL (IV SOLUTION) ×3 IMPLANT
PACK GENERAL/GYN (CUSTOM PROCEDURE TRAY) ×3 IMPLANT
PACK UNIVERSAL I (CUSTOM PROCEDURE TRAY) ×3 IMPLANT
PAD ARMBOARD 7.5X6 YLW CONV (MISCELLANEOUS) ×6 IMPLANT
PAD CAST 4YDX4 CTTN HI CHSV (CAST SUPPLIES) ×1 IMPLANT
PADDING CAST COTTON 4X4 STRL (CAST SUPPLIES) ×3
SCREW CORT TI DBL LEAD 5X32 (Screw) ×6 IMPLANT
SCREW CORT TI DBL LEAD 5X60 (Screw) ×3 IMPLANT
SCREW CORT TI DBL LEAD 5X70 (Screw) ×3 IMPLANT
SCREW CORT TI DBL LEAD 5X75 (Screw) ×3 IMPLANT
SCREW CORT TI DBL LEAD 5X80 (Screw) ×3 IMPLANT
SCREW CORT TI DBL LEAD 5X85 (Screw) ×3 IMPLANT
STAPLER VISISTAT 35W (STAPLE) ×3 IMPLANT
STOCKINETTE IMPERVIOUS LG (DRAPES) ×3 IMPLANT
SUT ETHILON 2 0 FS 18 (SUTURE) ×6 IMPLANT
SUT ETHILON 3 0 PS 1 (SUTURE) ×18 IMPLANT
SUT VIC AB 0 CTB1 27 (SUTURE) ×6 IMPLANT
SUT VIC AB 2-0 CTB1 (SUTURE) ×6 IMPLANT
SYR CONTROL 10ML LL (SYRINGE) ×3 IMPLANT
TOWEL OR 17X24 6PK STRL BLUE (TOWEL DISPOSABLE) ×3 IMPLANT
TOWEL OR 17X26 10 PK STRL BLUE (TOWEL DISPOSABLE) ×3 IMPLANT
WATER STERILE IRR 1000ML POUR (IV SOLUTION) ×3 IMPLANT

## 2018-10-28 NOTE — Brief Op Note (Signed)
10/28/2018 10/28/2018  6:51 PM  PATIENT:  Melanie Hall  48 y.o. female  PRE-OPERATIVE DIAGNOSIS:  left distal femur fracture  POST-OPERATIVE DIAGNOSIS:  left distal femur fracture  PROCEDURE:  Procedure(s): RETROGRADE FEMORAL NAILING  SURGEON:  Surgeon(s): Marlou Sa, Tonna Corner, MD  ASSISTANT: Angela Nevin RNFA  ANESTHESIA:   general  EBL: 75 ml    Total I/O In: 2065 [I.V.:1000; Blood:315; IV Piggyback:750] Out: -   BLOOD ADMINISTERED: 1 u prbc  DRAINS: none   LOCAL MEDICATIONS USED: Marcaine morphine clonidine  SPECIMEN:  No Specimen  COUNTS:  YES  TOURNIQUET:  * No tourniquets in log *  DICTATION: .Other Dictation: Dictation Number [id number]  PLAN OF CARE: Admit to inpatient   PATIENT DISPOSITION:  PACU - hemodynamically stable

## 2018-10-28 NOTE — Op Note (Signed)
NAME: Melanie Hall, Melanie Hall MEDICAL RECORD BL:[id number] ACCOUNT [id number] DATE OF BIRTH:[birth year] FACILITY: [facility] LOCATION: [facility] PHYSICIAN:Duanna Runk Randel Pigg, MD  OPERATIVE REPORT  DATE OF PROCEDURE:  10/28/2018  PREOPERATIVE DIAGNOSIS:  Left distal femur fracture with intercondylar extension, supracondylar variety.  POSTOPERATIVE DIAGNOSIS:  Left distal femur fracture with intercondylar extension, supracondylar variety.  PROCEDURE:  Intramedullary nailing as well as reduction of the condyles of supracondylar femur fracture.  SURGEON:  Meredith Pel, MD  ASSISTANT:  Laure Kidney, RNFA  INDICATIONS:  The patient is a 48 year old patient with left distal femur fracture who presents for operative management after explanation of risks and benefits.  She sustained a low energy mechanical fall today.  She presents now for operative  management after explanation of risks and benefits.  PROCEDURE IN DETAIL:  The patient was brought to the operating room where general anesthetic was induced.  Preoperative antibiotics were administered.  Timeout was called.  Left leg was prescrubbed with alcohol and Betadine and allowed to air dry.   Prepped with DuraPrep solution and draped in a sterile manner.  Charlie Pitter was used to cover the entire operative field.  The patient's preoperative hemoglobin is around 10, but with hydration she dropped to 6.8.  She did receive 1 unit of packed red blood  cells as a result of that.  Our blood loss was fairly minimal, around 75 mL.  Under fluoroscopic guidance, the course of the femur was visualized.  Reduction was achieved with the use of a triangle.  Incision was made at the inferior pole of the patella.   Guide pin placed across the fracture, which was well reduced.  After palpation of the fracture, it was noted that there was intercondylar extension.  One 5.0 screw was placed to maintain the reduction on the condyles prior to nail placement.  Once this  was done  under fluoroscopic guidance and had the condyle secured, attention was directed towards the correct placement of the guide pin.  The guide pin was placed across the fracture, and then proximal reaming was performed.  This was done under  appropriate distraction and rotation to allow for optimal fracture alignment.  Reaming was performed up to 12 mm, and then a 32 x 10.5 mm nail was placed with locking screws x4 placed distally and 2 locking screws placed proximally.  All these screws  were placed under fluoroscopic guidance using the jig distally and using freehand technique proximally.  Following placement of the interlocking screws, thorough irrigation of the knee joint was performed along with all interlocking screw incisions.   Bone quality was extremely poor.  Distal screws were locked with the device.  The nail utilized was a Biomet retrograde nail.  Following thorough irrigation of the knee joint and fluoroscopic confirmation of excellent fracture reduction and placement of  the screws, the knee joint was thoroughly irrigated.  The patellar tendon was closed using #1 Vicryl suture followed by interrupted inverted 2-0 Vicryl suture and 3-0 nylon.  The interlocking incisions proximally and distally were irrigated and closed  using 2-0 Vicryl and 3-0 nylon.  Aquacel dressings along with Ace wrap and knee immobilizer were placed.  The patient tolerated the procedure well without immediate complication.  She will need to be nonweightbearing for at least 3 weeks to allow for  early fracture healing to occur.  Again, bone quality is poor.  LN/NUANCE  D:10/28/2018 T:10/28/2018 JOB:[id number]

## 2018-10-28 NOTE — Transfer of Care (Signed)
Immediate Anesthesia Transfer of Care Note  Patient: Melanie Hall  Procedure(s) Performed: RETROGRADE FEMORAL NAILING (Left Leg Upper)  Patient Location: PACU  Anesthesia Type:General  Level of Consciousness: awake, alert  and oriented  Airway & Oxygen Therapy: Patient Spontanous Breathing  Post-op Assessment: Report given to RN and Post -op Vital signs reviewed and stable  Post vital signs: Reviewed and stable  Last Vitals:  Vitals Value Taken Time  BP 131/86 10/28/2018  7:06 PM  Temp    Pulse 99 10/28/2018  7:09 PM  Resp 14 10/28/2018  7:09 PM  SpO2 99 % 10/28/2018  7:09 PM  Vitals shown include unvalidated device data.  Last Pain:  Vitals:   10/28/18 1524  TempSrc: Oral  PainSc: 10-Worst pain ever      Patients Stated Pain Goal: 2 (70/01/74 9449)  Complications: No apparent anesthesia complications

## 2018-10-28 NOTE — ED Triage Notes (Signed)
Pt slipped and fell in her living room.  C/o of left knee pain. Denies hitting her head

## 2018-10-28 NOTE — Clinical Social Work Note (Signed)
CSW met with patient about PCP and insurance.  Pt confirms she has neither.  States she was connected with [facility] the last time she was here earlier this year, but has had to reschedule with Dr three different times due to unforseen conflicts having to do with family matters and transportation. Stated she plans to call them again after this current crises is over to set up an appointment.  Confirmed that she has their information in her phone.

## 2018-10-28 NOTE — ED Notes (Signed)
Patient transported to X-ray 

## 2018-10-28 NOTE — Progress Notes (Signed)
Placed receipt from security in labeled personal belongings bag.

## 2018-10-28 NOTE — ED Provider Notes (Signed)
[facility] EMERGENCY DEPARTMENT Provider Note   CSN: [id number] Arrival date & time: 10/28/18  1152    History   Chief Complaint Chief Complaint  Patient presents with  . Knee Pain    HPI Melanie Hall is a 48 y.o. female.     HPI Patient presents after mechanical fall. Patient was in her usual state of health when she slipped, just prior to ED arrival. She notes that she fell with her left leg bending beneath her in an awkward position. Since that time she has been nonambulatory with severe pain, swelling in the left knee. No medication taken for pain relief. She notes that she has had baseline diminished sensation everywhere, acknowledges history of diabetes, states that she takes her medicine regularly though she did not do so today. No other trauma, no other complaints. Past Medical History:  Diagnosis Date  . Diabetes mellitus   . Hypertension     Patient Active Problem List   Diagnosis Date Noted  . Sprain of left ankle   . Emphysematous cystitis 08/26/2018  . Vaginosis 08/26/2018  . Hypokalemia 08/26/2018  . Normocytic anemia 08/26/2018  . Closed fracture of right ankle 11/06/2017  . IRREGULAR MENSES 09/14/2009  . BURN, FIRST DEGREE, ARM 09/14/2009  . HYPERCHOLESTEROLEMIA 11/19/2008  . PARONYCHIA, RIGHT GREAT TOE 07/30/2008  . ABSCESS, TOOTH 03/31/2008  . Uncontrolled diabetes mellitus with hyperglycemia ([place]) 07/02/2007  . CATARACTS, BILATERAL 07/02/2007    Past Surgical History:  Procedure Laterality Date  . KNEE SURGERY    . TUBAL LIGATION       OB History   No obstetric history on file.      Home Medications    Prior to Admission medications   Medication Sig Start Date End Date Taking? Authorizing Provider  acetaminophen (TYLENOL) 325 MG tablet Take 2 tablets (650 mg total) by mouth every 6 (six) hours as needed for mild pain or headache (or Fever >/= 101). 08/28/18   Barton Dubois, MD  blood glucose meter kit and supplies KIT Dispense  based on patient and insurance preference. Use to check blood sugar three times daily as directed. (FOR ICD-9 250.00, 250.01). 08/28/18   Barton Dubois, MD  insulin detemir (LEVEMIR) 100 unit/ml SOLN Inject 0.15 mLs (15 Units total) into the skin daily. Patient not taking: Reported on 09/01/2018 08/28/18 12/26/18  Barton Dubois, MD  Insulin Pen Needle (PEN NEEDLES) 31G X 8 MM MISC Use daily to inject insulin as instructed Patient not taking: Reported on 09/01/2018 08/28/18   Barton Dubois, MD  lisinopril (PRINIVIL,ZESTRIL) 5 MG tablet Take 1 tablet (5 mg total) by mouth daily. 08/28/18 12/26/18  Barton Dubois, MD  metFORMIN (GLUCOPHAGE) 500 MG tablet Take 1 tablet (500 mg total) by mouth 2 (two) times daily with a meal. 08/28/18 12/26/18  Barton Dubois, MD  ondansetron (ZOFRAN ODT) 8 MG disintegrating tablet Take 1 tablet (8 mg total) by mouth every 8 (eight) hours as needed for nausea or vomiting. 08/28/18   Barton Dubois, MD  pantoprazole (PROTONIX) 40 MG tablet Take 1 tablet (40 mg total) by mouth daily. 08/28/18 08/28/19  Barton Dubois, MD    Family History Family History  Problem Relation Age of Onset  . Diabetes Mother   . Hypertension Mother   . Bipolar disorder Mother   . Brain cancer Maternal Aunt   . Heart disease Maternal Grandmother   . Diabetes Maternal Grandmother   . Breast cancer Maternal Aunt     Social History Social History  Tobacco Use  . Smoking status: Never Smoker  . Smokeless tobacco: Never Used  Substance Use Topics  . Alcohol use: No  . Drug use: No     Allergies   Patient has no known allergies.   Review of Systems Review of Systems  Constitutional:       Per HPI, otherwise negative  HENT:       Per HPI, otherwise negative  Respiratory:       Per HPI, otherwise negative  Cardiovascular:       Per HPI, otherwise negative  Gastrointestinal: Negative for vomiting.  Endocrine:       Negative aside from HPI  Genitourinary:       Neg aside from HPI    Musculoskeletal:       Per HPI, otherwise negative  Skin: Negative.   Neurological: Positive for numbness. Negative for syncope.     Physical Exam Updated Vital Signs BP (!) 185/92   Pulse 92   Temp 97.7 F (36.5 C)   Resp 16   Ht '5\' 2"'  (1.575 m)   Wt 61.2 kg   LMP 12/17/2016   SpO2 95%   BMI 24.69 kg/m   Physical Exam Vitals signs and nursing note reviewed.  Constitutional:      General: She is not in acute distress.    Appearance: She is well-developed.     Comments: Uncomfortable appearing adult female awake and alert  HENT:     Head: Normocephalic and atraumatic.  Eyes:     Conjunctiva/sclera: Conjunctivae normal.  Cardiovascular:     Rate and Rhythm: Normal rate and regular rhythm.     Pulses: Normal pulses.  Pulmonary:     Effort: Pulmonary effort is normal. No respiratory distress.     Breath sounds: Normal breath sounds. No stridor.  Abdominal:     General: There is no distension.  Musculoskeletal:       Legs:       Feet:  Skin:    General: Skin is warm and dry.  Neurological:     Mental Status: She is alert and oriented to person, place, and time.     Cranial Nerves: No cranial nerve deficit.     Comments: Patient has diminished sensation in the feet, she notes this is unchanged per She can plantarflex, dorsiflex, without limitation of the ankle.       ED Treatments / Results  Labs (all labs ordered are listed, but only abnormal results are displayed) Labs Reviewed  CBC WITH DIFFERENTIAL/PLATELET - Abnormal; Notable for the following components:      Result Value   RBC 3.41 (*)    Hemoglobin 10.8 (*)    HCT 30.0 (*)    All other components within normal limits  CBG MONITORING, ED - Abnormal; Notable for the following components:   Glucose-Capillary 315 (*)    All other components within normal limits  BASIC METABOLIC PANEL    Radiology Dg Knee 1-2 Views Left  Result Date: 10/28/2018 CLINICAL DATA:  Fall. Pain. EXAM: LEFT KNEE -  1-2 VIEW COMPARISON:  08/26/2018 FINDINGS: There is an oblique angulated fracture of the distal femur, supracondylar location. Soft tissue swelling. Positive for suprapatellar bursa effusion. No knee dislocation. IMPRESSION: Oblique angulated supracondylar fracture of the distal femur. Electronically Signed   By: Staci Righter M.D.   On: 10/28/2018 12:58    Procedures .Ortho Injury Treatment Date/Time: 10/28/2018 2:44 PM Performed by: Carmin Muskrat, MD Authorized by: Carmin Muskrat, MD  Consent:    Consent obtained:  Verbal   Consent given by:  Patient   Risks discussed:  Fracture   Alternatives discussed:  No treatment and alternative treatment Universal protocol:    Procedure explained and questions answered to patient or proxy's satisfaction: yes     Relevant documents present and verified: yes     Test results available and properly labeled: yes     Imaging studies available: yes     Required blood products, implants, devices, and special equipment available: yes     Site/side marked: yes     Immediately prior to procedure a time out was called: yes     Patient identity confirmed:  Verbally with patientInjury location: upper leg Injury type: fracture Fracture type: supracondylar Pre-procedure neurovascular assessment: neurovascularly intact Pre-procedure distal perfusion: normal Pre-procedure neurological function: normal Pre-procedure range of motion: reduced  Anesthesia: Local anesthesia used: no  Patient sedated: NoManipulation performed: no MAST used: no Immobilization: splint Splint type: long leg Supplies used: Ortho-Glass Post-procedure neurovascular assessment: post-procedure neurovascularly intact Post-procedure distal perfusion: normal Post-procedure neurological function: normal Post-procedure range of motion: unchanged Patient tolerance: Patient tolerated the procedure well with no immediate complications    (including critical care time)   Medications Ordered in ED Medications  HYDROmorphone (DILAUDID) injection 1 mg (1 mg Intravenous Given 10/28/18 1310)  HYDROmorphone (DILAUDID) injection 1 mg (1 mg Intramuscular Given 10/28/18 1230)  sodium chloride 0.9 % bolus 500 mL (500 mLs Intravenous New Bag/Given 10/28/18 1302)     Initial Impression / Assessment and Plan / ED Course  I have reviewed the triage vital signs and the nursing notes.  Pertinent labs & imaging results that were available during my care of the patient were reviewed by me and considered in my medical decision making (see chart for details).    I reviewed the x-ray, agree with the interpretation. Subsequently discussed this with our orthopedic surgeon on call.    1:21 PM On repeat exam the patient continues to complain of pain. I have discussed her case with our orthopedic surgeon on-call, Dr. Marlou Sa. With concern for supracondylar fracture, the patient will require operative repair.   Final Clinical Impressions(s) / ED Diagnoses   Final diagnoses:  Fall, initial encounter  Closed displaced supracondylar fracture of distal end of left femur without intracondylar extension, initial encounter ([place])      Carmin Muskrat, MD 10/28/18 1445

## 2018-10-28 NOTE — Progress Notes (Signed)
Dr. Ermalene Postin notified of CBG and BP.

## 2018-10-28 NOTE — Anesthesia Procedure Notes (Signed)
Procedure Name: Intubation Date/Time: 10/28/2018 4:28 PM Performed by: Eligha Bridegroom, CRNA Pre-anesthesia Checklist: Patient identified, Emergency Drugs available, Suction available, Patient being monitored and Timeout performed Patient Re-evaluated:Patient Re-evaluated prior to induction Oxygen Delivery Method: Circle system utilized Preoxygenation: Pre-oxygenation with 100% oxygen Induction Type: IV induction Laryngoscope Size: Mac and 3 Grade View: Grade I Tube size: 7.0 mm Airway Equipment and Method: Stylet Placement Confirmation: ETT inserted through vocal cords under direct vision,  positive ETCO2 and breath sounds checked- equal and bilateral Secured at: 21 cm Tube secured with: Tape Dental Injury: Teeth and Oropharynx as per pre-operative assessment

## 2018-10-28 NOTE — Consult Note (Signed)
Reason for Consult:Left distal femur fx Referring Physician: Elam Dutch Melanie Hall is an 48 y.o. female.  HPI: Melanie Hall was at home this morning putting on her jacket when she lost her balance and fell. Her right foot went out in front and her left leg crumpled beneath her. She had immediate pain and could not get up. She was taken to the ED at [facility] where x-rays showed a distal femur fx. She was transferred to [facility] for further care and orthopedic surgery was consulted.  Past Medical History:  Diagnosis Date  . Cataracts, bilateral    surgery to remove  . Diabetes mellitus    type 2  . GERD (gastroesophageal reflux disease)   . Hypertension   . SVD (spontaneous vaginal delivery)    x 4    Past Surgical History:  Procedure Laterality Date  . EYE SURGERY Bilateral    cataracts removed  . KNEE SURGERY    . TUBAL LIGATION      Family History  Problem Relation Age of Onset  . Diabetes Mother   . Hypertension Mother   . Bipolar disorder Mother   . Brain cancer Maternal Aunt   . Heart disease Maternal Grandmother   . Diabetes Maternal Grandmother   . Breast cancer Maternal Aunt     Social History:  reports that she quit smoking about 23 years ago. Her smoking use included cigarettes. She has never used smokeless tobacco. She reports that she does not drink alcohol or use drugs.  Allergies: No Known Allergies  Medications: I have reviewed the patient's current medications.  Results for orders placed or performed during the hospital encounter of 10/28/18 (from the past 48 hour(s))  CBG monitoring, ED     Status: Abnormal   Collection Time: 10/28/18 11:57 AM  Result Value Ref Range   Glucose-Capillary 315 (H) 70 - 99 mg/dL  Basic metabolic panel     Status: Abnormal   Collection Time: 10/28/18  1:01 PM  Result Value Ref Range   Sodium 134 (L) 135 - 145 mmol/L   Potassium 3.7 3.5 - 5.1 mmol/L   Chloride 104 98 - 111 mmol/L   CO2 23 22 - 32 mmol/L   Glucose, Bld 345 (H) 70 - 99  mg/dL   BUN 17 6 - 20 mg/dL   Creatinine, Ser 1.15 (H) 0.44 - 1.00 mg/dL   Calcium 8.1 (L) 8.9 - 10.3 mg/dL   GFR calc non Af Amer 56 (L) >60 mL/min   GFR calc Af Amer >60 >60 mL/min   Anion gap 7 5 - 15    Comment: Performed at [facility], [address] [place], [place] [address]  CBC with Differential     Status: Abnormal   Collection Time: 10/28/18  1:01 PM  Result Value Ref Range   WBC 10.1 4.0 - 10.5 K/uL   RBC 3.41 (L) 3.87 - 5.11 MIL/uL   Hemoglobin 10.8 (L) 12.0 - 15.0 g/dL   HCT 30.0 (L) 36.0 - 46.0 %   MCV 88.0 80.0 - 100.0 fL   MCH 31.7 26.0 - 34.0 pg   MCHC 36.0 30.0 - 36.0 g/dL   RDW 11.8 11.5 - 15.5 %   Platelets 235 150 - 400 K/uL   nRBC 0.0 0.0 - 0.2 %   Neutrophils Relative % 73 %   Neutro Abs 7.5 1.7 - 7.7 K/uL   Lymphocytes Relative 15 %   Lymphs Abs 1.5 0.7 - 4.0 K/uL   Monocytes  Relative 7 %   Monocytes Absolute 0.7 0.1 - 1.0 K/uL   Eosinophils Relative 3 %   Eosinophils Absolute 0.3 0.0 - 0.5 K/uL   Basophils Relative 1 %   Basophils Absolute 0.1 0.0 - 0.1 K/uL   Immature Granulocytes 1 %   Abs Immature Granulocytes 0.07 0.00 - 0.07 K/uL    Comment: Performed at [facility], [address], [place], [place] [address]  CBG monitoring, ED     Status: Abnormal   Collection Time: 10/28/18  2:04 PM  Result Value Ref Range   Glucose-Capillary 328 (H) 70 - 99 mg/dL  Glucose, capillary     Status: Abnormal   Collection Time: 10/28/18  3:17 PM  Result Value Ref Range   Glucose-Capillary 328 (H) 70 - 99 mg/dL   Comment 1 Notify RN    Comment 2 Document in Chart     Dg Knee 1-2 Views Left  Result Date: 10/28/2018 CLINICAL DATA:  Fall. Pain. EXAM: LEFT KNEE - 1-2 VIEW COMPARISON:  08/26/2018 FINDINGS: There is an oblique angulated fracture of the distal femur, supracondylar location. Soft tissue swelling. Positive for suprapatellar bursa effusion. No knee dislocation. IMPRESSION: Oblique angulated supracondylar fracture of the distal femur. Electronically  Signed   By: Staci Righter M.D.   On: 10/28/2018 12:58    Review of Systems  Constitutional: Negative for weight loss.  HENT: Negative for ear discharge, ear pain, hearing loss and tinnitus.   Eyes: Negative for blurred vision, double vision, photophobia and pain.  Respiratory: Negative for cough, sputum production and shortness of breath.   Cardiovascular: Negative for chest pain.  Gastrointestinal: Negative for abdominal pain, nausea and vomiting.  Genitourinary: Negative for dysuria, flank pain, frequency and urgency.  Musculoskeletal: Positive for falls and joint pain (Left knee). Negative for back pain, myalgias and neck pain.  Neurological: Negative for dizziness, tingling, sensory change, focal weakness, loss of consciousness and headaches.  Endo/Heme/Allergies: Does not bruise/bleed easily.  Psychiatric/Behavioral: Negative for depression, memory loss and substance abuse. The patient is not nervous/anxious.    Blood pressure (!) 205/103, pulse 100, temperature (!) 97.5 F (36.4 C), temperature source Oral, resp. rate 16, height 5\' 2"  (1.575 m), weight 61.2 kg, last menstrual period 12/17/2016, SpO2 100 %. Physical Exam  Constitutional: She appears well-developed and well-nourished. No distress.  HENT:  Head: Normocephalic and atraumatic.  Eyes: Conjunctivae are normal. Right eye exhibits no discharge. Left eye exhibits no discharge. No scleral icterus.  Neck: Normal range of motion.  Cardiovascular: Normal rate and regular rhythm.  Respiratory: Effort normal. No respiratory distress.  Musculoskeletal:     Comments: LLE No traumatic wounds, ecchymosis, or rash  Severe TTP knee  No ankle effusion  Sens DPN, SPN, TN paresthetic  Motor EHL, ext, flex, evers 5/5  DP 2+, PT 1+, No significant edema  Neurological: She is alert.  Skin: Skin is warm and dry. She is not diaphoretic.  Psychiatric: She has a normal mood and affect. Her behavior is normal.    Assessment/Plan: Left  femur fx -- For retrograde IM nail today by Dr. Marlou Sa. Continue NPO. HTN DM    Lisette Abu, PA-C Orthopedic Surgery [phone] 10/28/2018, 3:45 PM

## 2018-10-28 NOTE — H&P (Addendum)
[facility] Medical Consultation  Melanie Hall UPJ:[id number] DOB: 07-17-1970 DOA: 10/28/2018 PCP: Patient, No Pcp Per   Requesting physician: Orthopedics, Dr. Marlou Sa Date of consultation: 10/28/2018 Reason for consultation: Medical management  Impression/Recommendations Principal Problem:   Closed fracture of left femur ([place]) Active Problems:   Uncontrolled diabetes mellitus with hyperglycemia ([place])   Normocytic anemia   Hypertension associated with diabetes ([place])  Melanie Hall is a 48 year old female with medical history significant for type 2 diabetes, hypertension, and normocytic anemia who is admitted with a left distal femur fracture after a mechanical fall. Status post IM nailing 10/28/2018.  Closed fracture of left femur s/p IM nailing 10/28/18: Patient seen in PACU postoperatively.  LLE in ACE wrapping with knee immobilizer. -Postop management and pain control per orthopedics  Acute on chronic normocytic anemia: Hgb 10.8 on admission.  Per op note there was estimated 75 mL blood loss in the OR.  Hemoglobin decreased to 7.8, likely combination of acute blood loss and dilution.  She was transfused 1 unit PRBC with improvement in hemoglobin to 9.8. -Hemoglobin improved appropriately after transfusion of 1 unit PRBC -Will hold off on second unit and recheck in the morning; transfuse if hemoglobin <7.0 or having active bleeding -Continue monitor for further signs/symptoms of bleeding -Will hold Lovenox tonight, may be able to restart tomorrow if hemoglobin stable -Place SCDs  Type 2 diabetes with hyperglycemia: Last A1c 13.3 on 08/27/2018.  She has been taking Levemir 15 units daily and metformin 500 mg twice daily.  She was placed on continuous insulin infusion in the OR for hyperglycemia.  Most recent CBG is 243. -Will discontinue insulin infusion -Resume home Levemir 15 units and start sensitive SSI with Accu-Cheks and hypoglycemia protocol -Hold home metformin for  now  Hypertension: She was hypertensive on arrival to the hospital in the setting of pain.  Blood pressures are improved postop. -Continue home lisinopril 5 mg tomorrow -IV hydralazine 5 mg q4h as needed for SBP >180 and/or DBP >110  Mildly elevated creatinine: Cr 1.15 on admission.  Renal function is normal at baseline.  She received 500 mL normal saline and has been started on maintenance IV fluids overnight. -Continue gentle hydration overnight and recheck labs in a.m.  Thank you for this consultation.  Our [facility] hospitalist team will follow the patient with you.  Chief Complaint: Left lower extremity pain aftera  fall  HPI:  Melanie Hall is a 48 year old female with medical history significant for type 2 diabetes, hypertension, and normocytic anemia who presented to the ED after suffering a mechanical fall when she slipped at home.  Patient seen in PACU postop.    She says that her left leg bent beneath her in an awkward position.  She developed immediate severe pain and was unable to ambulate on her own.  He denies any associated lightheadedness/dizziness, injury to the head, loss of consciousness, chest pain, palpitations, dyspnea, abdominal pain, dysuria, or seizure-like activity at the time of the fall.  She denies any obvious bleeding prior to admission including hemoptysis, hematemesis, hematuria, hematochezia, or melena.  She currently reports having nausea without emesis and continued left lower extremity pain.  In the ED her initial vitals showed BP 173/97, pulse 102, RR 16, temp 97.7 Fahrenheit, SPO2 99% on room air.  Labs are notable for WBC 10.1, hemoglobin 10.8, MCV 88, platelets 235, sodium 134, potassium 3.7, serum glucose 345, BUN 17, creatinine 1.15.  Left knee x-ray showed an oblique angulated supracondylar  fracture of the distal femur.  Orthopedics, Dr. Marlou Sa, was consulted and patient was taken to the OR for IM nailing and reduction of the condyles of supracondylar femur  fracture.  Postop, repeat hemoglobin was reported to decrease to 7.8.  Estimated operative blood loss was approximately 75 mL's per op note.  She was given 1 unit transfusion of PRBC.  She was also noted to be hyperglycemic and started on a continuous insulin infusion.  The hospitalist service was consulted to assist with medical management of her anemia, diabetes, and hypertension.  Review of Systems:  As per HPI otherwise 10 point review of systems negative.   Past Medical History:  Diagnosis Date  . Cataracts, bilateral    surgery to remove  . Diabetes mellitus    type 2  . GERD (gastroesophageal reflux disease)   . Hypertension   . SVD (spontaneous vaginal delivery)    x 4   Past Surgical History:  Procedure Laterality Date  . EYE SURGERY Bilateral    cataracts removed  . KNEE SURGERY    . TUBAL LIGATION     Social History:  reports that she quit smoking about 23 years ago. Her smoking use included cigarettes. She has never used smokeless tobacco. She reports that she does not drink alcohol or use drugs.  No Known Allergies Family History  Problem Relation Age of Onset  . Diabetes Mother   . Hypertension Mother   . Bipolar disorder Mother   . Brain cancer Maternal Aunt   . Heart disease Maternal Grandmother   . Diabetes Maternal Grandmother   . Breast cancer Maternal Aunt     Prior to Admission medications   Medication Sig Start Date End Date Taking? Authorizing Provider  acetaminophen (TYLENOL) 325 MG tablet Take 2 tablets (650 mg total) by mouth every 6 (six) hours as needed for mild pain or headache (or Fever >/= 101). 08/28/18  Yes Barton Dubois, MD  blood glucose meter kit and supplies KIT Dispense based on patient and insurance preference. Use to check blood sugar three times daily as directed. (FOR ICD-9 250.00, 250.01). 08/28/18  Yes Barton Dubois, MD  ibuprofen (ADVIL) 800 MG tablet Take 800 mg by mouth every 8 (eight) hours as needed.   Yes [provider]  insulin detemir (LEVEMIR) 100 unit/ml SOLN Inject 0.15 mLs (15 Units total) into the skin daily. 08/28/18 12/26/18 Yes Barton Dubois, MD  lisinopril (PRINIVIL,ZESTRIL) 5 MG tablet Take 1 tablet (5 mg total) by mouth daily. 08/28/18 12/26/18 Yes Barton Dubois, MD  metFORMIN (GLUCOPHAGE) 500 MG tablet Take 1 tablet (500 mg total) by mouth 2 (two) times daily with a meal. 08/28/18 12/26/18 Yes Barton Dubois, MD  pantoprazole (PROTONIX) 40 MG tablet Take 1 tablet (40 mg total) by mouth daily. 08/28/18 08/28/19 Yes Barton Dubois, MD  Insulin Pen Needle (PEN NEEDLES) 31G X 8 MM MISC Use daily to inject insulin as instructed Patient not taking: Reported on 09/01/2018 08/28/18   Barton Dubois, MD  ondansetron (ZOFRAN ODT) 8 MG disintegrating tablet Take 1 tablet (8 mg total) by mouth every 8 (eight) hours as needed for nausea or vomiting. Patient not taking: Reported on 10/28/2018 08/28/18   Barton Dubois, MD   Physical Exam: Blood pressure 137/87, pulse 96, temperature 97.6 F (36.4 C), temperature source Oral, resp. rate 18, height 5' 2" (1.575 m), weight 61.2 kg, last menstrual period 12/17/2016, SpO2 99 %. Vitals:   10/28/18 2005 10/28/18 2028  BP: 138/73 137/87  Pulse:  97 96  Resp: 11 18  Temp:  97.6 F (36.4 C)  SpO2: 99% 99%  Patient seen in PACU postop. Constitutional: Resting supine in bed, calm, somewhat uncomfortable due to LLE pain Eyes: PERRL, lids and conjunctivae normal ENMT: Mucous membranes are moist. Posterior pharynx clear of any exudate or lesions.Normal dentition.  Neck: normal, supple, no masses. Respiratory: Clear to auscultation anteriorly.  Normal respiratory effort. No accessory muscle use.  Cardiovascular: Regular rate and rhythm, no murmurs / rubs / gallops. Abdomen: no tenderness, no masses palpated. No hepatosplenomegaly. Bowel sounds positive.  Musculoskeletal: Ace wrap and medial mobilizer in place LLE.  ROM not assessed postop. Skin: no rashes,  lesions, ulcers. No induration Neurologic: CN 2-12 grossly intact. Sensation intact, strength of upper extremities intact.  Strength of lower extremities not assessed immediately postop. Psychiatric: Normal judgment and insight. Alert and oriented x 3. Normal mood.   Labs on Admission:  Basic Metabolic Panel: Recent Labs  Lab 10/28/18 1301 10/28/18 1733  NA 134* 137  K 3.7 3.8  CL 104  --   CO2 23  --   GLUCOSE 345* 319*  BUN 17  --   CREATININE 1.15*  --   CALCIUM 8.1*  --    Liver Function Tests: No results for input(s): AST, ALT, ALKPHOS, BILITOT, PROT, ALBUMIN in the last 168 hours. No results for input(s): LIPASE, AMYLASE in the last 168 hours. No results for input(s): AMMONIA in the last 168 hours. CBC: Recent Labs  Lab 10/28/18 1301 10/28/18 1733  WBC 10.1  --   NEUTROABS 7.5  --   HGB 10.8* 7.8*  HCT 30.0* 23.0*  MCV 88.0  --   PLT 235  --    Cardiac Enzymes: No results for input(s): CKTOTAL, CKMB, CKMBINDEX, TROPONINI in the last 168 hours. BNP: Invalid input(s): POCBNP CBG: Recent Labs  Lab 10/28/18 1157 10/28/18 1404 10/28/18 1517 10/28/18 1907  GLUCAP 315* 328* 328* 243*    Radiological Exams on Admission: Dg Knee 1-2 Views Left  Result Date: 10/28/2018 CLINICAL DATA:  Fall. Pain. EXAM: LEFT KNEE - 1-2 VIEW COMPARISON:  08/26/2018 FINDINGS: There is an oblique angulated fracture of the distal femur, supracondylar location. Soft tissue swelling. Positive for suprapatellar bursa effusion. No knee dislocation. IMPRESSION: Oblique angulated supracondylar fracture of the distal femur. Electronically Signed   By: Staci Righter M.D.   On: 10/28/2018 12:58   Dg C-arm 1-60 Min  Result Date: 10/28/2018 CLINICAL DATA:  Fall. Pain. EXAM: DG C-ARM 61-120 MIN; LEFT FEMUR PORTABLE 2 VIEWS; LEFT FEMUR 2 VIEWS COMPARISON:  Plain films earlier today. FINDINGS: Intraoperative spot films demonstrate open reduction internal fixation of a distal femur fracture.  Intramedullary rod is been anchored with by cortical screws. IMPRESSION: As above. Improved position and alignment. Electronically Signed   By: Staci Righter M.D.   On: 10/28/2018 20:08   Dg Femur Min 2 Views Left  Result Date: 10/28/2018 CLINICAL DATA:  Fall. Pain. EXAM: DG C-ARM 61-120 MIN; LEFT FEMUR PORTABLE 2 VIEWS; LEFT FEMUR 2 VIEWS COMPARISON:  Plain films earlier today. FINDINGS: Intraoperative spot films demonstrate open reduction internal fixation of a distal femur fracture. Intramedullary rod is been anchored with by cortical screws. IMPRESSION: As above. Improved position and alignment. Electronically Signed   By: Staci Righter M.D.   On: 10/28/2018 20:08   Dg Femur Port Min 2 Views Left  Result Date: 10/28/2018 CLINICAL DATA:  Fall. Pain. EXAM: DG C-ARM 61-120 MIN; LEFT FEMUR PORTABLE 2 VIEWS;  LEFT FEMUR 2 VIEWS COMPARISON:  Plain films earlier today. FINDINGS: Intraoperative spot films demonstrate open reduction internal fixation of a distal femur fracture. Intramedullary rod is been anchored with by cortical screws. IMPRESSION: As above. Improved position and alignment. Electronically Signed   By: Staci Righter M.D.   On: 10/28/2018 20:08    EKG: Independently reviewed.  Sinus rhythm, rate 92, no acute ischemic changes, motion artifact present.  No prior for comparison.    Zada Finders, MD [facility] Pager [phone]  If 7PM-7AM, please contact night-coverage [email or web address] 10/28/2018, 8:44 PM

## 2018-10-28 NOTE — Anesthesia Postprocedure Evaluation (Signed)
Anesthesia Post Note  Patient: Melanie Hall  Procedure(s) Performed: RETROGRADE FEMORAL NAILING (Left Leg Upper)     Patient location during evaluation: PACU Anesthesia Type: General Level of consciousness: awake and alert Pain management: pain level controlled Vital Signs Assessment: post-procedure vital signs reviewed and stable Respiratory status: spontaneous breathing, nonlabored ventilation, respiratory function stable and patient connected to nasal cannula oxygen Cardiovascular status: blood pressure returned to baseline and stable Postop Assessment: no apparent nausea or vomiting Anesthetic complications: no    Last Vitals:  Vitals:   10/28/18 1524 10/28/18 1535  BP: (!) 205/103 (!) 191/105  Pulse: 100   Resp:    Temp: (!) 36.4 C   SpO2: 100%     Last Pain:  Vitals:   10/28/18 1524  TempSrc: Oral  PainSc: 10-Worst pain ever                 Shaylynne Lunt S

## 2018-10-28 NOTE — Anesthesia Preprocedure Evaluation (Addendum)
Anesthesia Evaluation  Patient identified by MRN, date of birth, ID band Patient awake    Reviewed: Allergy & Precautions, NPO status , Patient's Chart, lab work & pertinent test results  History of Anesthesia Complications Negative for: history of anesthetic complications  Airway Mallampati: II  TM Distance: >3 FB Neck ROM: Full    Dental  (+) Dental Advisory Given   Pulmonary neg shortness of breath, neg sleep apnea, neg recent URI, former smoker,    breath sounds clear to auscultation       Cardiovascular hypertension, Pt. on medications  Rhythm:Regular     Neuro/Psych negative neurological ROS  negative psych ROS   GI/Hepatic GERD  Medicated and Controlled,  Endo/Other  diabetes, Type 2, Insulin Dependent  Renal/GU      Musculoskeletal left distal femur fracture   Abdominal   Peds  Hematology  (+) anemia ,   Anesthesia Other Findings   Reproductive/Obstetrics                            Anesthesia Physical Anesthesia Plan  ASA: III  Anesthesia Plan: General   Post-op Pain Management:    Induction: Intravenous and Rapid sequence  PONV Risk Score and Plan: 3 and Ondansetron and Dexamethasone  Airway Management Planned: Oral ETT  Additional Equipment: None  Intra-op Plan:   Post-operative Plan: Extubation in OR  Informed Consent: I have reviewed the patients History and Physical, chart, labs and discussed the procedure including the risks, benefits and alternatives for the proposed anesthesia with the patient or authorized representative who has indicated his/her understanding and acceptance.     Dental advisory given  Plan Discussed with: CRNA and Surgeon  Anesthesia Plan Comments:         Anesthesia Quick Evaluation

## 2018-10-29 ENCOUNTER — Encounter (HOSPITAL_COMMUNITY): Payer: Self-pay | Admitting: General Practice

## 2018-10-29 DIAGNOSIS — E1159 Type 2 diabetes mellitus with other circulatory complications: Secondary | ICD-10-CM

## 2018-10-29 DIAGNOSIS — D649 Anemia, unspecified: Secondary | ICD-10-CM

## 2018-10-29 DIAGNOSIS — I10 Essential (primary) hypertension: Secondary | ICD-10-CM

## 2018-10-29 DIAGNOSIS — E1165 Type 2 diabetes mellitus with hyperglycemia: Secondary | ICD-10-CM

## 2018-10-29 LAB — CBC
HCT: 24.9 % — ABNORMAL LOW (ref 36.0–46.0)
Hemoglobin: 9 g/dL — ABNORMAL LOW (ref 12.0–15.0)
MCH: 32.1 pg (ref 26.0–34.0)
MCHC: 36.1 g/dL — ABNORMAL HIGH (ref 30.0–36.0)
MCV: 88.9 fL (ref 80.0–100.0)
Platelets: 171 10*3/uL (ref 150–400)
RBC: 2.8 MIL/uL — ABNORMAL LOW (ref 3.87–5.11)
RDW: 12.2 % (ref 11.5–15.5)
WBC: 9.9 10*3/uL (ref 4.0–10.5)
nRBC: 0 % (ref 0.0–0.2)

## 2018-10-29 LAB — BASIC METABOLIC PANEL
Anion gap: 9 (ref 5–15)
BUN: 19 mg/dL (ref 6–20)
CO2: 22 mmol/L (ref 22–32)
Calcium: 8 mg/dL — ABNORMAL LOW (ref 8.9–10.3)
Chloride: 105 mmol/L (ref 98–111)
Creatinine, Ser: 1.29 mg/dL — ABNORMAL HIGH (ref 0.44–1.00)
GFR calc Af Amer: 57 mL/min — ABNORMAL LOW (ref >60)
GFR calc non Af Amer: 49 mL/min — ABNORMAL LOW (ref >60)
Glucose, Bld: 188 mg/dL — ABNORMAL HIGH (ref 70–99)
Potassium: 3.7 mmol/L (ref 3.5–5.1)
Sodium: 136 mmol/L (ref 135–145)

## 2018-10-29 LAB — GLUCOSE, CAPILLARY
Glucose-Capillary: 167 mg/dL — ABNORMAL HIGH (ref 70–99)
Glucose-Capillary: 131 mg/dL — ABNORMAL HIGH (ref 70–99)
Glucose-Capillary: 83 mg/dL (ref 70–99)
Glucose-Capillary: 85 mg/dL (ref 70–99)

## 2018-10-29 MED ORDER — OXYCODONE HCL 5 MG PO TABS
5.0000 mg | ORAL_TABLET | ORAL | Status: DC | PRN
  Administered 2018-10-29: 5 mg via ORAL
  Administered 2018-10-30 (×2): 15 mg via ORAL
  Administered 2018-10-30: 09:00:00 10 mg via ORAL
  Administered 2018-10-31 – 2018-11-03 (×9): 15 mg via ORAL
  Filled 2018-10-29 (×7): qty 3
  Filled 2018-10-29: qty 2
  Filled 2018-10-29 (×3): qty 3
  Filled 2018-10-29: qty 1
  Filled 2018-10-29: qty 3

## 2018-10-29 MED ORDER — ENOXAPARIN SODIUM 40 MG/0.4ML ~~LOC~~ SOLN
40.0000 mg | SUBCUTANEOUS | Status: DC
  Administered 2018-10-29 – 2018-11-03 (×6): 40 mg via SUBCUTANEOUS
  Filled 2018-10-29 (×6): qty 0.4

## 2018-10-29 NOTE — Progress Notes (Signed)
OT Cancellation Note  Patient Details Name: Melanie [name] MRN: [id number] DOB: 05-25-1971   Cancelled Treatment:    Reason Eval/Treat Not Completed: Other (comment). Pt in chair with eyes closed. Pt does not respond to therapist despite therapist's multiple attempts. Therapist informed nursing staff who reports pt has been demonstrating this behavior with multiple providers today. Will re-attempt evaluation tomorrow.    Darrol Jump OTR/L [place] [phone] 10/29/2018, 4:23 PM

## 2018-10-29 NOTE — Plan of Care (Signed)
  Problem: Nutrition: Goal: Adequate nutrition will be maintained Outcome: Progressing   Problem: Skin Integrity: Goal: Risk for impaired skin integrity will decrease Outcome: Progressing   

## 2018-10-29 NOTE — Progress Notes (Signed)
Subjective: Patient stable.  She states that she is having some numbness all over.  She was sleeping extremely soundly when I came in.  She reports having pain in the leg.  All dressings removed with Aquasol dressings remaining in place.   Objective: Vital signs in last 24 hours: Temp:  [97.2 F (36.2 C)-98.1 F (36.7 C)] 98.1 F (36.7 C) (04/22 0841) Pulse Rate:  [80-103] 89 (04/22 0841) Resp:  [11-18] 16 (04/22 0841) BP: (107-205)/(70-105) 107/89 (04/22 0841) SpO2:  [95 %-100 %] 100 % (04/22 0841) Weight:  [61.2 kg] 61.2 kg (04/21 1524)  Intake/Output from previous day: 04/21 0701 - 04/22 0700 In: 3596.8 [P.O.:120; I.V.:2211.8; Blood:315; IV Piggyback:950] Out: 250 [Blood:250] Intake/Output this shift: No intake/output data recorded.  Exam:  Intact pulses distally No cellulitis present Compartment soft Patient has weak ankle dorsiflexion but that may be a matter of effort. Labs: Recent Labs    10/28/18 1301 10/28/18 1733 10/28/18 2137 10/29/18 0830  HGB 10.8* 7.8* 9.8* 9.0*   Recent Labs    10/28/18 1301  10/28/18 2137 10/29/18 0830  WBC 10.1  --   --  9.9  RBC 3.41*  --   --  2.80*  HCT 30.0*   < > 27.4* 24.9*  PLT 235  --   --  171   < > = values in this interval not displayed.   Recent Labs    10/28/18 1301 10/28/18 1733 10/29/18 0830  NA 134* 137 136  K 3.7 3.8 3.7  CL 104  --  105  CO2 23  --  22  BUN 17  --  19  CREATININE 1.15*  --  1.29*  GLUCOSE 345* 319* 188*  CALCIUM 8.1*  --  8.0*   No results for input(s): LABPT, INR in the last 72 hours.  Assessment/Plan: Plan at this time is nonweightbearing left lower extremity.  Her bone quality was very poor.  I will hold off even on CPM for now.  I am not sure she is going to be able to be discharged to home based on her weightbearing restrictions.   Landry Dyke Dean 10/29/2018, 12:57 PM

## 2018-10-29 NOTE — Progress Notes (Signed)
ANTICOAGULATION CONSULT NOTE - Initial Consult  Pharmacy Consult for Lovenox Indication: VTE Prophylaxis  No Known Allergies Patient Measurements: Height: 5\' 2"  (157.5 cm) Weight: 135 lb (61.2 kg) IBW/kg (Calculated) : 50.1 Vital Signs: Temp: 98 F (36.7 C) (04/22 1423) Temp Source: Oral (04/22 1423) BP: 116/73 (04/22 1423) Pulse Rate: 95 (04/22 1423) Labs: Recent Labs    10/28/18 1301 10/28/18 1733 10/28/18 2137 10/29/18 0830  HGB 10.8* 7.8* 9.8* 9.0*  HCT 30.0* 23.0* 27.4* 24.9*  PLT 235  --   --  171  CREATININE 1.15*  --   --  1.29*   Estimated Creatinine Clearance: 45.9 mL/min (A) (by C-G formula based on SCr of 1.29 mg/dL (H)).  Assessment: 48 year old female admitted 4/21 with L-femur fracture s/p repair in OR.  Pharmacy has been consulted to provide Lovenox VTE prophylaxis dosing.   AET 10/28/18 at 19:10PM.  SCr up at 1.29 with estimated CrCl~ 45-50 mL/min.  Hgb low but stable at 9. Platelets down at 171 post-operatively.    Goal of Therapy:  Monitor platelets by anticoagulation protocol: Yes   Plan:  Lovenox 40mg  SQ daily - start at 1800PM.  Monitor renal function and CBC.   Brain Hilts 10/29/2018,3:00 PM

## 2018-10-29 NOTE — Progress Notes (Signed)
PROGRESS NOTE    Melanie [name]  DJT:[id number] DOB: [birth year] DOA: 10/28/2018 PCP: Patient, No Pcp Per    Brief Narrative:   Melanie [name] is a 48 year old female with medical history significant for type 2 diabetes, hypertension, and normocytic anemia who presented to the ED after suffering a mechanical fall when she slipped at home.   In the ED her initial vitals showed BP 173/97, pulse 102, RR 16, temp 97.7 Fahrenheit, SPO2 99% on room air. WBC 10.1, hemoglobin 10.8, MCV 88, platelets 235, sodium 134, potassium 3.7, serum glucose 345, BUN 17, creatinine 1.15. Left knee x-ray showed an oblique angulated supracondylar fracture of the distal femur.  Orthopedics, Dr. Marlou Sa, was consulted and patient was taken to the OR for IM nailing and reduction of the condyles of supracondylar femur fracture.    Patient was seen in the PACU postoperatively, Postop, repeat hemoglobin was reported to decrease to 7.8.  Estimated operative blood loss was approximately 75 mL's per op note. She was given 1 unit transfusion of PRBC.  She was also noted to be hyperglycemic and started on a continuous insulin infusion.  The hospitalist service was consulted to assist with medical management of her anemia, diabetes, and hypertension   Assessment & Plan:   Principal Problem:   Closed fracture of left femur ([place]) Active Problems:   Uncontrolled diabetes mellitus with hyperglycemia (HCC)   Normocytic anemia   Hypertension associated with diabetes ([place])  Closed fracture left femur Patient presenting following mechanical fall with x-ray findings showing angulated supracondylar fracture of the distal femur.  Patient underwent IM nail on 10/28/2018 by orthopedics, Dr. Marlou Sa. --Continues nonweightbearing status to left lower extremity due to quality per orthopedics --Continue PT --Robaxin, oxycodone prn for pain control --Restart Lovenox today for DVT prophylaxis --Will likely need SNF versus home health, SW for  coordination --Further per primary service, orthopedics  Acute on chronic normocytic anemia Hemoglobin 10.8 on admission, postoperatively hemoglobin decreased to 7.8.  Operative note notes estimated 75 mL blood loss.  Etiology likely combination of acute blood loss anemia and dilution.  Patient was transfused 1 unit PRBCs on 10/28/2018 with expected rise in hemoglobin to 9.8. --Hemoglobin 9.0 at this morning; stable --Restart Lovenox for DVT prophylaxis today --Continue daily CBC  Postoperative urinary retention Overnight, bladder scan notable for 741mL retained urine, status post in and out cath. --Continue to monitor urinary output --Bladder scan as needed --Encourage mobilization as much as possible  Type 2 diabetes mellitus Hemoglobin A1c admission 13.3, poorly controlled.  But at 335 on admission.  Concerned about compliance with home regimen of Levemir 15 units daily.  Patient uninsured, may need alternative regimen such as NPH 70/30. --Diabetic educator following; appreciate assistance --Continue Levemir 15 units [place] qhs --ISS for coverage --On ACE inhibitor  Essential hypertension Pressure elevated on arrival to the hospital, this in the setting of acute pain. --BP now well controlled 115/73 this morning --Continue lisinopril 5 mg p.o. daily --IV hydralazine as needed  Elevated creatinine Creatinine on admission 1.15, up to 1.29 this morning.  Etiology likely dehydration with prolonged n.p.o. status and surgical intervention yesterday. --continue IVF hydration with LR until oral intake improves --Repeat BMP in the a.m.  DVT prophylaxis: Restart Lovenox for DVT prophylaxis today Code Status: Full code Family Communication: None Disposition Plan: Likely will need SNF vs HH, social work for coordination   Consultants:   Orthopedics - primary service  Procedures:   IM nail left distal femur fracture 10/28/2018;  Dr. Marlou Sa  Antimicrobials:  Perioperative cefazolin    Subjective: Patient seen and examined at bedside, resting comfortably in bedside chair.  Complains of some mild leg pain.  Early this morning, patient unable to void and bladder scan notable for 700 mL's of urine, status post in and out cath.  No other complaints at this time.  Denies chest pain, no palpitations, no abdominal pain, no shortness of breath.  No other acute events overnight per nursing staff.  Objective: Vitals:   10/28/18 2353 10/29/18 0453 10/29/18 0841 10/29/18 1423  BP: 121/76 115/73 107/89 116/73  Pulse: 80 82 89 95  Resp: 16 16 16 20   Temp: 97.6 F (36.4 C) 97.9 F (36.6 C) 98.1 F (36.7 C) 98 F (36.7 C)  TempSrc: Oral Oral Oral Oral  SpO2: 100% 98% 100% 98%  Weight:      Height:        Intake/Output Summary (Last 24 hours) at 10/29/2018 1440 Last data filed at 10/29/2018 1000 Gross per 24 hour  Intake 3096.78 ml  Output 250 ml  Net 2846.78 ml   Filed Weights   10/28/18 1153 10/28/18 1524  Weight: 61.2 kg 61.2 kg    Examination:  General exam: Appears calm and comfortable  Respiratory system: Clear to auscultation. Respiratory effort normal. Cardiovascular system: S1 & S2 heard, RRR. No JVD, murmurs, rubs, gallops or clicks. No pedal edema. Gastrointestinal system: Abdomen is nondistended, soft and nontender. No organomegaly or masses felt. Normal bowel sounds heard. Central nervous system: Alert and oriented. No focal neurological deficits. Extremities: Symmetric 5 x 5 power. Skin: Dressing in place left lower extremity, mild pain Psychiatry: Judgement and insight appear normal. Mood & affect appropriate.     Data Reviewed: I have personally reviewed following labs and imaging studies  CBC: Recent Labs  Lab 10/28/18 1301 10/28/18 1733 10/28/18 2137 10/29/18 0830  WBC 10.1  --   --  9.9  NEUTROABS 7.5  --   --   --   HGB 10.8* 7.8* 9.8* 9.0*  HCT 30.0* 23.0* 27.4* 24.9*  MCV 88.0  --   --  88.9  PLT 235  --   --  893   Basic Metabolic  Panel: Recent Labs  Lab 10/28/18 1301 10/28/18 1733 10/29/18 0830  NA 134* 137 136  K 3.7 3.8 3.7  CL 104  --  105  CO2 23  --  22  GLUCOSE 345* 319* 188*  BUN 17  --  19  CREATININE 1.15*  --  1.29*  CALCIUM 8.1*  --  8.0*   GFR: Estimated Creatinine Clearance: 45.9 mL/min (A) (by C-G formula based on SCr of 1.29 mg/dL (H)). Liver Function Tests: No results for input(s): AST, ALT, ALKPHOS, BILITOT, PROT, ALBUMIN in the last 168 hours. No results for input(s): LIPASE, AMYLASE in the last 168 hours. No results for input(s): AMMONIA in the last 168 hours. Coagulation Profile: No results for input(s): INR, PROTIME in the last 168 hours. Cardiac Enzymes: No results for input(s): CKTOTAL, CKMB, CKMBINDEX, TROPONINI in the last 168 hours. BNP (last 3 results) No results for input(s): PROBNP in the last 8760 hours. HbA1C: No results for input(s): HGBA1C in the last 72 hours. CBG: Recent Labs  Lab 10/28/18 1517 10/28/18 1907 10/28/18 2101 10/29/18 0839 10/29/18 1235  GLUCAP 328* 243* 221* 167* 131*   Lipid Profile: No results for input(s): CHOL, HDL, LDLCALC, TRIG, CHOLHDL, LDLDIRECT in the last 72 hours. Thyroid Function Tests: No results for input(s):  TSH, T4TOTAL, FREET4, T3FREE, THYROIDAB in the last 72 hours. Anemia Panel: No results for input(s): VITAMINB12, FOLATE, FERRITIN, TIBC, IRON, RETICCTPCT in the last 72 hours. Sepsis Labs: No results for input(s): PROCALCITON, LATICACIDVEN in the last 168 hours.  No results found for this or any previous visit (from the past 240 hour(s)).       Radiology Studies: Dg Knee 1-2 Views Left  Result Date: 10/28/2018 CLINICAL DATA:  Fall. Pain. EXAM: LEFT KNEE - 1-2 VIEW COMPARISON:  08/26/2018 FINDINGS: There is an oblique angulated fracture of the distal femur, supracondylar location. Soft tissue swelling. Positive for suprapatellar bursa effusion. No knee dislocation. IMPRESSION: Oblique angulated supracondylar fracture of  the distal femur. Electronically Signed   By: Staci Righter M.D.   On: 10/28/2018 12:58   Dg C-arm 1-60 Min  Result Date: 10/28/2018 CLINICAL DATA:  Fall. Pain. EXAM: DG C-ARM 61-120 MIN; LEFT FEMUR PORTABLE 2 VIEWS; LEFT FEMUR 2 VIEWS COMPARISON:  Plain films earlier today. FINDINGS: Intraoperative spot films demonstrate open reduction internal fixation of a distal femur fracture. Intramedullary rod is been anchored with by cortical screws. IMPRESSION: As above. Improved position and alignment. Electronically Signed   By: Staci Righter M.D.   On: 10/28/2018 20:08   Dg Femur Min 2 Views Left  Result Date: 10/28/2018 CLINICAL DATA:  Fall. Pain. EXAM: DG C-ARM 61-120 MIN; LEFT FEMUR PORTABLE 2 VIEWS; LEFT FEMUR 2 VIEWS COMPARISON:  Plain films earlier today. FINDINGS: Intraoperative spot films demonstrate open reduction internal fixation of a distal femur fracture. Intramedullary rod is been anchored with by cortical screws. IMPRESSION: As above. Improved position and alignment. Electronically Signed   By: Staci Righter M.D.   On: 10/28/2018 20:08   Dg Femur Port Min 2 Views Left  Result Date: 10/28/2018 CLINICAL DATA:  Fall. Pain. EXAM: DG C-ARM 61-120 MIN; LEFT FEMUR PORTABLE 2 VIEWS; LEFT FEMUR 2 VIEWS COMPARISON:  Plain films earlier today. FINDINGS: Intraoperative spot films demonstrate open reduction internal fixation of a distal femur fracture. Intramedullary rod is been anchored with by cortical screws. IMPRESSION: As above. Improved position and alignment. Electronically Signed   By: Staci Righter M.D.   On: 10/28/2018 20:08        Scheduled Meds: . aspirin EC  81 mg Oral BID  . docusate sodium  100 mg Oral BID  . insulin aspart  0-5 Units Subcutaneous QHS  . insulin aspart  0-9 Units Subcutaneous TID WC  . insulin detemir  15 Units Subcutaneous QHS  . lisinopril  5 mg Oral Daily   Continuous Infusions: . lactated ringers 75 mL/hr at 10/29/18 0646  . methocarbamol (ROBAXIN) IV        LOS: 1 day    Time spent: 28 minutes    Eric J [place] DO [facility] Pager [phone]  If 7PM-7AM, please contact night-coverage [email or web address] Password [email or web address] 10/29/2018, 2:40 PM

## 2018-10-29 NOTE — Progress Notes (Signed)
Pt unable to void after surgery. Bladder scan noted with 765ml urine. In and out cath done with 763ml urine output.

## 2018-10-29 NOTE — Evaluation (Signed)
Physical Therapy Evaluation Patient Details Name: Melanie [name] MRN: [id number] DOB: Dec 09, 1970 Today's Date: 10/29/2018   History of Present Illness  Pt is a 48 y.o. female admitted 10/28/18 after fall sustaining L distal femur fx; now s/p L femoral IM nail 4/21. PMH includes HTN, DM, anemia, cataracts.     Clinical Impression  Pt presents with an overall decrease in functional mobility secondary to above. PTA, pt indep and lives with daughters. Educ on precautions, positioning, therex, importance of mobility. Today, pt able to initiate transfer training with RW and minA; difficulty progressing gait as pt unable to maintain LLE NWB precautions due to pain. Pt would benefit from continued acute PT services to maximize functional mobility and independence prior to d/c with HHPT services.     Follow Up Recommendations Home health PT;Supervision for mobility/OOB    Equipment Recommendations  Rolling walker with 5" wheels;3in1 (PT)(TBD on crutches)    Recommendations for Other Services       Precautions / Restrictions Precautions Precautions: Fall Required Braces or Orthoses: Knee Immobilizer - Left Knee Immobilizer - Left: Other (comment)(No order for KI, but left on LLE during session 4/22) Restrictions Weight Bearing Restrictions: Yes LLE Weight Bearing: Non weight bearing      Mobility  Bed Mobility Overal bed mobility: Needs Assistance Bed Mobility: Supine to Sit     Supine to sit: Supervision     General bed mobility comments: Pt able to come to long sitting independently; able to assist LLE out of bed with BUE support, no physical assist required; increased time and effort due to pain  Transfers Overall transfer level: Needs assistance Equipment used: Rolling walker (2 wheeled) Transfers: Sit to/from Stand Sit to Stand: Min assist         General transfer comment: MinA to assist trunk elevation, heavy reliance on UE support to push into standing. Difficulty  maintaining LLE NWB precautions upon standing despite max cues  Ambulation/Gait Ambulation/Gait assistance: Min assist Gait Distance (Feet): 2 Feet Assistive device: Rolling walker (2 wheeled) Gait Pattern/deviations: Step-to pattern;Decreased weight shift to left;Antalgic Gait velocity: Decreased   General Gait Details: MinA to maintain balance taking pivotal hops from bed to recliner with RW; despite max cues, pt not maintaining L NWB and taking some steps on LLE. Will require increased assist to maintain NWB  Stairs            Wheelchair Mobility    Modified Rankin (Stroke Patients Only)       Balance Overall balance assessment: Needs assistance   Sitting balance-Leahy Scale: Good       Standing balance-Leahy Scale: Poor Standing balance comment: Reliant on UE support                             Pertinent Vitals/Pain Pain Assessment: Faces Faces Pain Scale: Hurts even more Pain Location: LLE Pain Descriptors / Indicators: Grimacing;Guarding Pain Intervention(s): Limited activity within patient's tolerance;Repositioned    Home Living Family/patient expects to be discharged to:: Private residence Living Arrangements: Children Available Help at Discharge: Family;Available PRN/intermittently Type of Home: House Home Access: Stairs to enter Entrance Stairs-Rails: None Entrance Stairs-Number of Steps: 1 threshold Home Layout: One level Home Equipment: Hand held shower head Additional Comments: Lives with 2 daughters (1 y.o. and 73 y.o. I believe) who can assist as needed    Prior Function Level of Independence: Independent  Hand Dominance        Extremity/Trunk Assessment   Upper Extremity Assessment Upper Extremity Assessment: Overall WFL for tasks assessed    Lower Extremity Assessment Lower Extremity Assessment: LLE deficits/detail LLE Deficits / Details: s/p L femur IM nail; hip flex <3/5, limited by pain LLE:  Unable to fully assess due to pain;Unable to fully assess due to immobilization LLE Coordination: decreased gross motor       Communication   Communication: No difficulties  Cognition Arousal/Alertness: Awake/alert Behavior During Therapy: WFL for tasks assessed/performed Overall Cognitive Status: Within Functional Limits for tasks assessed                                 General Comments: WFL for simple tasks      General Comments      Exercises     Assessment/Plan    PT Assessment Patient needs continued PT services  PT Problem List Decreased strength;Decreased range of motion;Decreased activity tolerance;Decreased balance;Decreased mobility;Decreased knowledge of use of DME;Decreased knowledge of precautions;Pain       PT Treatment Interventions DME instruction;Gait training;Stair training;Functional mobility training;Therapeutic activities;Therapeutic exercise;Balance training;Patient/family education    PT Goals (Current goals can be found in the Care Plan section)  Acute Rehab PT Goals Patient Stated Goal: Return home with help from daughters PT Goal Formulation: With patient Time For Goal Achievement: 11/12/18 Potential to Achieve Goals: Good    Frequency Min 5X/week   Barriers to discharge        Co-evaluation               AM-PAC PT "6 Clicks" Mobility  Outcome Measure Help needed turning from your back to your side while in a flat bed without using bedrails?: None Help needed moving from lying on your back to sitting on the side of a flat bed without using bedrails?: None Help needed moving to and from a bed to a chair (including a wheelchair)?: A Little Help needed standing up from a chair using your arms (e.g., wheelchair or bedside chair)?: A Little Help needed to walk in hospital room?: A Lot Help needed climbing 3-5 steps with a railing? : A Lot 6 Click Score: 18    End of Session Equipment Utilized During Treatment: Gait  belt Activity Tolerance: Patient limited by pain Patient left: in chair;with call bell/phone within reach;with chair alarm set Nurse Communication: Mobility status PT Visit Diagnosis: Other abnormalities of gait and mobility (R26.89);Pain Pain - Right/Left: Left Pain - part of body: Leg    Time: 3536-1443 PT Time Calculation (min) (ACUTE ONLY): 28 min   Charges:   PT Evaluation $PT Eval Moderate Complexity: 1 Mod PT Treatments $Therapeutic Activity: 8-22 mins      Mabeline Caras, PT, DPT Acute Rehabilitation Services  Pager [phone] Office [phone]  Derry Lory 10/29/2018, 11:17 AM

## 2018-10-29 NOTE — Progress Notes (Addendum)
Inpatient Diabetes Program Recommendations  AACE/ADA: New Consensus Statement on Inpatient Glycemic Control (2015)  Target Ranges:  Prepandial:   less than 140 mg/dL      Peak postprandial:   less than 180 mg/dL (1-2 hours)      Critically ill patients:  140 - 180 mg/dL   Lab Results  Component Value Date   GLUCAP 131 (H) 10/29/2018   HGBA1C 13.3 (H) 08/27/2018    Results for [name], Melanie (MRN [id number]) as of 10/29/2018 13:23  Ref. Range 10/28/2018 11:57 10/28/2018 14:04 10/28/2018 15:17 10/28/2018 19:07 10/28/2018 21:01 10/29/2018 08:39 10/29/2018 12:35  Glucose-Capillary Latest Ref Range: 70 - 99 mg/dL 315 (H) 328 (H) 328 (H) 243 (H) 221 (H) 167 (H) 131 (H)    Review of Glycemic Control  Diabetes history: DM2  Outpatient Diabetes medications: Levemir 15 units QHS                                                        Glucophage 500mg  BID  Current orders for Inpatient glycemic control: Levemir 15 units QHS                                                                          Novolog sensitive (0-9 units) tid/(0-5 units)hs    Per chart review, diabetes coordinator spoke to patient on 08/28/2018 regarding Hgb A1c of 13.3% (average of 335 over the past 2-3 months). At that time patient was not taking any diabetic medications. (Per Epic, Hgb A1c was 11-12% range as far back as 2008-2009.) Patient was instructed on insulin injections during Feb 2020 admission and d/c home on Levemir 15 units daily and Metformin 500mg  BID.  Patient does not have insurance nor PCP.  Per case manager's notes today she will give patient [place].   Have placed call into patient and bedside RN to verify if she has been able to obtain and take insulin ordered at d/c in February. (Diabetic Coordinator working remotely). When she was admitted her CBG was 315mg /dl. Without insurance, the ordered Levemir patient was d/c home with would most likely would be expensive for patient to purchase. There  would be more affordable options for her such as Novolin 70/30 insulin from [facility].   Awaiting return call to gather more information regarding what patient is taking at home for her diabetes.  -- Will follow during hospitalization.--  Jonna Clark RN, MSN Diabetes Coordinator Inpatient Glycemic Control Team Team Pager: [phone] (8am-5pm)     Inpatient Diabetes Program Recommendations:

## 2018-10-29 NOTE — Progress Notes (Signed)
CSW attempted to call into patient room, patient not answering. RNCM noted in person attempt as well in which patient did not respond to [facility], patient not engaged with [facility] team at this time for discharge planning.   [place], [place]

## 2018-10-29 NOTE — TOC Initial Note (Signed)
Transition of Care [facility]) - Initial/Assessment Note    Patient Details  Name: Melanie [name] MRN: [id number] Date of Birth: 05/09/1971  Transition of Care [facility]) CM/SW Contact:    Marilu Favre, RN Phone Number: 10/29/2018, 1:02 PM  Clinical Narrative:                  Attempted to speak with patient twice this morning. Patient "asleep".   Per PT note patient from home with daughters. PT recommendations HHPT and walker and 3 in 1 .   Per chart patient uninsured and no PCP. Patient lives in [place]. Will provide [facility] information.    Will attempt to speak with patient again later today.   Expected Discharge Plan: [place] Barriers to Discharge: Continued Medical Work up   Patient Goals and CMS Choice Patient states their goals for this hospitalization and ongoing recovery are:: [place] asleep       Expected Discharge Plan and Services Expected Discharge Plan: [place]   Discharge Planning Services: CM Consult   Living arrangements for the past 2 months: Single Family Home                          Prior Living Arrangements/Services Living arrangements for the past 2 months: Single Family Home Lives with:: Adult Children                   Activities of Daily Living Home Assistive Devices/Equipment: None ADL Screening (condition at time of admission) Patient's cognitive ability adequate to safely complete daily activities?: No Is the patient deaf or have difficulty hearing?: No Does the patient have difficulty seeing, even when wearing glasses/contacts?: No Does the patient have difficulty concentrating, remembering, or making decisions?: No Patient able to express need for assistance with ADLs?: Yes Does the patient have difficulty dressing or bathing?: No Independently performs ADLs?: Yes (appropriate for developmental age) Does the patient have difficulty walking or climbing stairs?:  Yes Weakness of Legs: Left Weakness of Arms/Hands: None  Permission Sought/Granted                  Emotional Assessment              Admission diagnosis:  Fall [W19.XXXA] Fall, initial encounter [id number]] Closed displaced supracondylar fracture of distal end of left femur without intracondylar extension, initial encounter ([place]) [S72.452A] Femur fracture ([place]) [S72.90XA] Patient Active Problem List   Diagnosis Date Noted  . Closed fracture of left femur ([place]) 10/28/2018  . Hypertension associated with diabetes ([place]) 10/28/2018  . Sprain of left ankle   . Emphysematous cystitis 08/26/2018  . Vaginosis 08/26/2018  . Hypokalemia 08/26/2018  . Normocytic anemia 08/26/2018  . Closed fracture of right ankle 11/06/2017  . IRREGULAR MENSES 09/14/2009  . BURN, FIRST DEGREE, ARM 09/14/2009  . HYPERCHOLESTEROLEMIA 11/19/2008  . PARONYCHIA, RIGHT GREAT TOE 07/30/2008  . ABSCESS, TOOTH 03/31/2008  . Uncontrolled diabetes mellitus with hyperglycemia ([place]) 07/02/2007  . CATARACTS, BILATERAL 07/02/2007   PCP:  Patient, No Pcp Per Pharmacy:   [facility] [place], [place] - [address] AT [address] [place] [address] Phone: [phone] Fax: [phone]     Social Determinants of Health (SDOH) Interventions    Readmission Risk Interventions No flowsheet data found.

## 2018-10-30 DIAGNOSIS — S72402D Unspecified fracture of lower end of left femur, subsequent encounter for closed fracture with routine healing: Secondary | ICD-10-CM

## 2018-10-30 LAB — CBC
HCT: 24.8 % — ABNORMAL LOW (ref 36.0–46.0)
Hemoglobin: 8.8 g/dL — ABNORMAL LOW (ref 12.0–15.0)
MCH: 32 pg (ref 26.0–34.0)
MCHC: 35.5 g/dL (ref 30.0–36.0)
MCV: 90.2 fL (ref 80.0–100.0)
Platelets: 160 10*3/uL (ref 150–400)
RBC: 2.75 MIL/uL — ABNORMAL LOW (ref 3.87–5.11)
RDW: 12.4 % (ref 11.5–15.5)
WBC: 8.5 10*3/uL (ref 4.0–10.5)
nRBC: 0 % (ref 0.0–0.2)

## 2018-10-30 LAB — BASIC METABOLIC PANEL
Anion gap: 9 (ref 5–15)
BUN: 18 mg/dL (ref 6–20)
CO2: 20 mmol/L — ABNORMAL LOW (ref 22–32)
Calcium: 8.2 mg/dL — ABNORMAL LOW (ref 8.9–10.3)
Chloride: 108 mmol/L (ref 98–111)
Creatinine, Ser: 1.35 mg/dL — ABNORMAL HIGH (ref 0.44–1.00)
GFR calc Af Amer: 54 mL/min — ABNORMAL LOW (ref >60)
GFR calc non Af Amer: 46 mL/min — ABNORMAL LOW (ref >60)
Glucose, Bld: 110 mg/dL — ABNORMAL HIGH (ref 70–99)
Potassium: 3.4 mmol/L — ABNORMAL LOW (ref 3.5–5.1)
Sodium: 137 mmol/L (ref 135–145)

## 2018-10-30 LAB — GLUCOSE, CAPILLARY
Glucose-Capillary: 112 mg/dL — ABNORMAL HIGH (ref 70–99)
Glucose-Capillary: 189 mg/dL — ABNORMAL HIGH (ref 70–99)
Glucose-Capillary: 214 mg/dL — ABNORMAL HIGH (ref 70–99)
Glucose-Capillary: 160 mg/dL — ABNORMAL HIGH (ref 70–99)

## 2018-10-30 MED ORDER — POTASSIUM CHLORIDE CRYS ER 20 MEQ PO TBCR
40.0000 meq | EXTENDED_RELEASE_TABLET | Freq: Once | ORAL | Status: AC
  Administered 2018-10-30: 40 meq via ORAL
  Filled 2018-10-30: qty 2

## 2018-10-30 NOTE — Plan of Care (Signed)

## 2018-10-30 NOTE — Progress Notes (Signed)
CSW consulted with patient's daughter Melanie Hall to gain more information regarding patient's unusual avoidance behaviors. Melanie Hall reports she has suspected patient has mental health issues for a long time, reports patient had a traumatic childhood and grew up in an orphanage. Melanie Hall reports patient has continued to refuse any medical help she has needed in the past, therefore she is not surprised by patient's current behaviors towards staff in avoiding or acting like she's asleep. Melanie Hall is in agreement for a pscyh consult to occur for patient to address her mental health issues, and reports patient's mother was bipolar.   Regarding discharge planning, Melanie Hall reports SNF would be best option for patient. CSW informed Melanie Hall that in order for CSW to gain an LOG for patient to go to SNF (as patient has no insurance) patient would need to be fully participating in services offered. Melanie Hall reports she will attempt to call patient today to discuss, and states that yesterday patient had ignored all of her calls. Today she has spoken to patient for a short minute this morning in which patient reports she was refusing to eat.   CSW will continue to follow for dc planning and update family on psych consult findings.   [place], [place]

## 2018-10-30 NOTE — Progress Notes (Signed)
Occupational Therapy Evaluation Patient Details Name: Melanie Hall MRN: [id number] DOB: 1971-01-20 Today's Date: 10/30/2018    History of Present Illness Pt is a 48 y.o. female admitted 10/28/18 after fall sustaining L distal femur fx; now s/p L femoral IM nail 4/21. PMH includes HTN, DM, anemia, cataracts.    Clinical Impression   Pt admitted with above diagnosis. PTA, pt is independent and lives at home with daughters.  Pt presents with functional limitations due left LE pain.  Session was limited due to patient reporting 10/10 pain and has been pre-medicated. She requires max assist for LB ADLs. Pt was able to sit<>stand with min guard but declined further functional mobility.  Feel that pt would benefit from short stay at SNF to further progress rehab but pt is declining this option and states that her daughters will assist her.  Since pt is declining SNF, recommending HHOT with 24/7 support/assist for discharge planning. Will continue to follow acutely in order to maximize safety and independence with ADLs.    Follow Up Recommendations  Home health OT;Supervision/Assistance - 24 hour    Equipment Recommendations  3 in 1 bedside commode    Recommendations for Other Services       Precautions / Restrictions Precautions Precautions: Fall Restrictions Weight Bearing Restrictions: Yes LLE Weight Bearing: Non weight bearing      Mobility Bed Mobility Overal bed mobility: (pt up in chair)                Transfers Overall transfer level: Needs assistance Equipment used: Rolling walker (2 wheeled) Transfers: Sit to/from Stand Sit to Stand: Min guard         General transfer comment: Pt telling therapist not to touch her.  Pt able to power up from recliner with verbal cues for hand placement and maintaining NWB status. Pt stood ~30 seconds but then requested to sit down.     Balance Overall balance assessment: Needs assistance Sitting-balance support: No upper  extremity supported;Feet supported Sitting balance-Leahy Scale: Good       Standing balance-Leahy Scale: Poor Standing balance comment: Reliant on UE support                           ADL either performed or assessed with clinical judgement   ADL Overall ADL's : Needs assistance/impaired Eating/Feeding: Independent;Sitting   Grooming: Wash/dry face;Wash/dry hands;Set up;Sitting   Upper Body Bathing: Set up;Sitting   Lower Body Bathing: Maximal assistance;Sit to/from stand   Upper Body Dressing : Set up;Sitting   Lower Body Dressing: Maximal assistance;Sit to/from stand                 General ADL Comments: Pt limited by pain. Therapist educated pt on use of 3n1 as shower seat and to elevate toilet height.     Vision Patient Visual Report: No change from baseline       Perception     Praxis      Pertinent Vitals/Pain Pain Assessment: 0-10 Pain Score: 10-Worst pain ever Pain Location: LLE Pain Descriptors / Indicators: Grimacing;Guarding;Operative site guarding Pain Intervention(s): Limited activity within patient's tolerance;Premedicated before session;Monitored during session;Repositioned     Hand Dominance     Extremity/Trunk Assessment Upper Extremity Assessment Upper Extremity Assessment: Overall WFL for tasks assessed   Lower Extremity Assessment Lower Extremity Assessment: Defer to PT evaluation       Communication Communication Communication: No difficulties   Cognition Arousal/Alertness: Awake/alert Behavior During Therapy:  Flat affect Overall Cognitive Status: Within Functional Limits for tasks assessed                                 General Comments: WFL for simple tasks   General Comments       Exercises     Shoulder Instructions      Home Living Family/patient expects to be discharged to:: Private residence Living Arrangements: Children Available Help at Discharge: Family;Available  PRN/intermittently Type of Home: House Home Access: Stairs to enter [employer] of Steps: 1 threshold Entrance Stairs-Rails: None Home Layout: One level     Bathroom Shower/Tub: Teacher, early years/pre: Standard     Home Equipment: Hand held shower head   Additional Comments: Lives with 2 daughters (82 y.o. and 37 y.o. I believe) who can assist as needed      Prior Functioning/Environment Level of Independence: Independent                 OT Problem List:        OT Treatment/Interventions:      OT Goals(Current goals can be found in the care plan section) Acute Rehab OT Goals Patient Stated Goal: Return home with help from daughters OT Goal Formulation: With patient Time For Goal Achievement: 11/13/18 Potential to Achieve Goals: Fair  OT Frequency: Min 3X/week   Barriers to D/C:            Co-evaluation              AM-PAC OT "6 Clicks" Daily Activity     Outcome Measure Help from another person eating meals?: None Help from another person taking care of personal grooming?: A Little Help from another person toileting, which includes using toliet, bedpan, or urinal?: A Lot Help from another person bathing (including washing, rinsing, drying)?: A Lot Help from another person to put on and taking off regular upper body clothing?: A Little Help from another person to put on and taking off regular lower body clothing?: A Lot 6 Click Score: 16   End of Session Equipment Utilized During Treatment: Rolling walker Nurse Communication: Mobility status  Activity Tolerance: Patient limited by pain Patient left: in chair;with call bell/phone within reach  OT Visit Diagnosis: Unsteadiness on feet (R26.81);History of falling (Z91.81);Pain Pain - Right/Left: Left Pain - part of body: Leg;Hip                Time: 1425-1447 OT Time Calculation (min): 22 min Charges:  OT General Charges $OT Visit: 1 Visit OT Evaluation $OT Eval Moderate  Complexity: 1 Mod    Darrol Jump OTR/L [place] [phone] 10/30/2018, 3:25 PM

## 2018-10-30 NOTE — TOC Initial Note (Addendum)
Transition of Care [facility]) - Initial/Assessment Note    Patient Details  Name: Melanie Hall MRN: [id number] Date of Birth: 1971-06-24  Transition of Care [facility]) CM/SW Contact:    Marilu Favre, RN Phone Number: 10/30/2018, 2:12 PM  Clinical Narrative:                 Spoke to patient at bedside. Alert and interactive today.   Confirmed face sheet information . Provided [facility] information for PCP.   Discussed SNF and safety at home. Patient does NOT want SNF .  Patient states she has two daughters at home a 65 year old and a 23 year old and they are able to assist her. She will need walker and 3 in 1 . Her Aunt will transport her home.   Called daughter Melanie Hall and left voicemail awaiting call back. Melanie Hall returned call and is aware her mother is refusing SNF. Patient has a boyfriend who visits her on weekends , Melanie Hall has called he to see if he can assist her mother at home.   Will need orders and face to face for home health PT and walker and 3 in 1.   Called Dr Marlou Sa , he is currently in surgery.   Expected Discharge Plan: [place] Barriers to Discharge: Continued Medical Work up   Patient Goals and CMS Choice Patient states their goals for this hospitalization and ongoing recovery are:: to go home  CMS Medicare.gov Compare Post Acute Care list provided to:: Patient Choice offered to / list presented to : Patient  Expected Discharge Plan and Services Expected Discharge Plan: [place]   Discharge Planning Services: CM Consult   Living arrangements for the past 2 months: Single Family Home                                      Prior Living Arrangements/Services Living arrangements for the past 2 months: Single Family Home Lives with:: Adult Children Patient language and need for interpreter reviewed:: Yes Do you feel safe going back to the place where you live?: Yes            Criminal  Activity/Legal Involvement Pertinent to Current Situation/Hospitalization: No - Comment as needed  Activities of Daily Living Home Assistive Devices/Equipment: None ADL Screening (condition at time of admission) Patient's cognitive ability adequate to safely complete daily activities?: No Is the patient deaf or have difficulty hearing?: No Does the patient have difficulty seeing, even when wearing glasses/contacts?: No Does the patient have difficulty concentrating, remembering, or making decisions?: No Patient able to express need for assistance with ADLs?: Yes Does the patient have difficulty dressing or bathing?: No Independently performs ADLs?: Yes (appropriate for developmental age) Does the patient have difficulty walking or climbing stairs?: Yes Weakness of Legs: Left Weakness of Arms/Hands: None  Permission Sought/Granted                  Emotional Assessment              Admission diagnosis:  Fall [W19.XXXA] Fall, initial encounter [id number]] Closed displaced supracondylar fracture of distal end of left femur without intracondylar extension, initial encounter ([place]) [S72.452A] Femur fracture ([place]) [S72.90XA] Patient Active Problem List   Diagnosis Date Noted  . Closed fracture of left femur ([place]) 10/28/2018  . Hypertension associated with diabetes ([place]) 10/28/2018  .  Sprain of left ankle   . Emphysematous cystitis 08/26/2018  . Vaginosis 08/26/2018  . Hypokalemia 08/26/2018  . Normocytic anemia 08/26/2018  . Closed fracture of right ankle 11/06/2017  . IRREGULAR MENSES 09/14/2009  . BURN, FIRST DEGREE, ARM 09/14/2009  . HYPERCHOLESTEROLEMIA 11/19/2008  . PARONYCHIA, RIGHT GREAT TOE 07/30/2008  . ABSCESS, TOOTH 03/31/2008  . Uncontrolled diabetes mellitus with hyperglycemia ([place]) 07/02/2007  . CATARACTS, BILATERAL 07/02/2007   PCP:  Patient, No Pcp Per Pharmacy:   [facility] [place], [place] [address] [place] [address] Phone: [phone] Fax: [phone]     Social Determinants of Health (SDOH) Interventions    Readmission Risk Interventions No flowsheet data found.

## 2018-10-30 NOTE — Progress Notes (Signed)
PROGRESS NOTE    CHARNA NEEB  ZOX:[id number] DOB: 1970-11-19 DOA: 10/28/2018 PCP: Patient, No Pcp Per     Brief Narrative: Melanie Hall is a 48 year old female withmedical history significant for type 2 diabetes, hypertension, and normocytic anemia who presented to the ED after suffering a mechanical fall when she slipped at home.   In the ED her initial vitals showed BP 173/97, pulse 102, RR 16, temp 97.7 Fahrenheit, SPO2 99% on room air. WBC 10.1, hemoglobin 10.8, MCV 88, platelets 235, sodium 134, potassium 3.7, serum glucose 345, BUN 17, creatinine 1.15. Left knee x-ray showed an oblique angulated supracondylar fracture of the distal femur. Orthopedics, Dr. Marlou Sa, was consulted and patient was taken to the OR for IM nailing and reduction of the condyles of supracondylar femur fracture.    Patient was seen in the PACU postoperatively, Postop, repeat hemoglobin was reported to decrease to7.8. Estimated operative blood loss was approximately 75 mL's per op note. Shewas given 1 unit transfusion of PRBC. She was also noted to be hyperglycemic and started on a continuous insulin infusion. The hospitalist service was consulted to assist with medical management of her anemia, diabetes, and hypertension   Assessment & Plan:   Principal Problem:   Closed fracture of left femur ([place]) Active Problems:   Uncontrolled diabetes mellitus with hyperglycemia (H[facility])   Normocytic anemia   Hypertension associated with diabetes ([place])    Left distal femur fracture secondary to mechanical fall Orthopedics is the primary and patient underwent intramedullary nail placement. Nonweightbearing status to the left lower extremity as per orthopedist recommendations.   Type 2 diabetes mellitus   CBG (last 3)  Recent Labs    10/29/18 2100 10/30/18 0751 10/30/18 1205  GLUCAP 85 112* 189*   A1c is 13 on admission poorly controlled probably secondary to noncompliance to medications currently on  Levemir 15 units daily. Case manager consult for medication assistance will be requested.   Essential  hypertension Better controlled , lisinopril on hold for worsening renal para meters.  PRN IV hydralazine on board   Acute anemia of blood loss from the surgery. S/p 1 unit of PRBC transfusion Hemoglobin hovering between 8-9. Continue to monitor.     AKI probably from dehydration and prolonged n.p.o. Gently hydrate and repeat BMP tomorrow.   DVT prophylaxis: Lovenox Code Status: (Full code Family Communication: None at bedside  disposition Plan: Pending clinical improvement   Procedures: Surgical repair of the distal femur fracture Antimicrobials: None  Subjective: No new complaints  Objective: Vitals:   10/30/18 0438 10/30/18 0827 10/30/18 1014 10/30/18 1308  BP: (!) 162/92 (!) 157/89 (!) 145/80 (!) 141/80  Pulse: (!) 115 (!) 113 (!) 109 (!) 108  Resp: 20 16    Temp: 99.3 F (37.4 C) 98.5 F (36.9 C) 98.1 F (36.7 C) 98 F (36.7 C)  TempSrc: Oral Oral Oral Oral  SpO2: 94% 100% 97% 99%  Weight:      Height:        Intake/Output Summary (Last 24 hours) at 10/30/2018 1559 Last data filed at 10/30/2018 1519 Gross per 24 hour  Intake 2358.35 ml  Output 980 ml  Net 1378.35 ml   Filed Weights   10/28/18 1153 10/28/18 1524  Weight: 61.2 kg 61.2 kg    Examination:  General exam: Appears calm and comfortable  Respiratory system: Clear to auscultation. Respiratory effort normal. Cardiovascular system: S1 & S2 heard, RRR No pedal edema. Gastrointestinal system: Abdomen is nondistended, soft and nontender.  No organomegaly or masses felt. Normal bowel sounds heard. Central nervous system: Alert and oriented. No focal neurological deficits. Extremities: Symmetric 5 x 5 power. Skin: No rashes, lesions or ulcers Psychiatry:  Mood & affect appropriate.     Data Reviewed: I have personally reviewed following labs and imaging studies  CBC: Recent Labs  Lab  10/28/18 1301 10/28/18 1733 10/28/18 2137 10/29/18 0830 10/30/18 0419  WBC 10.1  --   --  9.9 8.5  NEUTROABS 7.5  --   --   --   --   HGB 10.8* 7.8* 9.8* 9.0* 8.8*  HCT 30.0* 23.0* 27.4* 24.9* 24.8*  MCV 88.0  --   --  88.9 90.2  PLT 235  --   --  171 759   Basic Metabolic Panel: Recent Labs  Lab 10/28/18 1301 10/28/18 1733 10/29/18 0830 10/30/18 0419  NA 134* 137 136 137  K 3.7 3.8 3.7 3.4*  CL 104  --  105 108  CO2 23  --  22 20*  GLUCOSE 345* 319* 188* 110*  BUN 17  --  19 18  CREATININE 1.15*  --  1.29* 1.35*  CALCIUM 8.1*  --  8.0* 8.2*   GFR: Estimated Creatinine Clearance: 43.8 mL/min (A) (by C-G formula based on SCr of 1.35 mg/dL (H)). Liver Function Tests: No results for input(s): AST, ALT, ALKPHOS, BILITOT, PROT, ALBUMIN in the last 168 hours. No results for input(s): LIPASE, AMYLASE in the last 168 hours. No results for input(s): AMMONIA in the last 168 hours. Coagulation Profile: No results for input(s): INR, PROTIME in the last 168 hours. Cardiac Enzymes: No results for input(s): CKTOTAL, CKMB, CKMBINDEX, TROPONINI in the last 168 hours. BNP (last 3 results) No results for input(s): PROBNP in the last 8760 hours. HbA1C: No results for input(s): HGBA1C in the last 72 hours. CBG: Recent Labs  Lab 10/29/18 1235 10/29/18 1605 10/29/18 2100 10/30/18 0751 10/30/18 1205  GLUCAP 131* 83 85 112* 189*   Lipid Profile: No results for input(s): CHOL, HDL, LDLCALC, TRIG, CHOLHDL, LDLDIRECT in the last 72 hours. Thyroid Function Tests: No results for input(s): TSH, T4TOTAL, FREET4, T3FREE, THYROIDAB in the last 72 hours. Anemia Panel: No results for input(s): VITAMINB12, FOLATE, FERRITIN, TIBC, IRON, RETICCTPCT in the last 72 hours. Sepsis Labs: No results for input(s): PROCALCITON, LATICACIDVEN in the last 168 hours.  No results found for this or any previous visit (from the past 240 hour(s)).       Radiology Studies: Dg C-arm 1-60 Min  Result  Date: 10/28/2018 CLINICAL DATA:  Fall. Pain. EXAM: DG C-ARM 61-120 MIN; LEFT FEMUR PORTABLE 2 VIEWS; LEFT FEMUR 2 VIEWS COMPARISON:  Plain films earlier today. FINDINGS: Intraoperative spot films demonstrate open reduction internal fixation of a distal femur fracture. Intramedullary rod is been anchored with by cortical screws. IMPRESSION: As above. Improved position and alignment. Electronically Signed   By: Staci Righter M.D.   On: 10/28/2018 20:08   Dg Femur Min 2 Views Left  Result Date: 10/28/2018 CLINICAL DATA:  Fall. Pain. EXAM: DG C-ARM 61-120 MIN; LEFT FEMUR PORTABLE 2 VIEWS; LEFT FEMUR 2 VIEWS COMPARISON:  Plain films earlier today. FINDINGS: Intraoperative spot films demonstrate open reduction internal fixation of a distal femur fracture. Intramedullary rod is been anchored with by cortical screws. IMPRESSION: As above. Improved position and alignment. Electronically Signed   By: Staci Righter M.D.   On: 10/28/2018 20:08   Dg Femur Port Min 2 Views Left  Result Date: 10/28/2018  CLINICAL DATA:  Fall. Pain. EXAM: DG C-ARM 61-120 MIN; LEFT FEMUR PORTABLE 2 VIEWS; LEFT FEMUR 2 VIEWS COMPARISON:  Plain films earlier today. FINDINGS: Intraoperative spot films demonstrate open reduction internal fixation of a distal femur fracture. Intramedullary rod is been anchored with by cortical screws. IMPRESSION: As above. Improved position and alignment. Electronically Signed   By: Staci Righter M.D.   On: 10/28/2018 20:08        Scheduled Meds: . aspirin EC  81 mg Oral BID  . docusate sodium  100 mg Oral BID  . enoxaparin (LOVENOX) injection  40 mg Subcutaneous Q24H  . insulin aspart  0-5 Units Subcutaneous QHS  . insulin aspart  0-9 Units Subcutaneous TID WC  . insulin detemir  15 Units Subcutaneous QHS   Continuous Infusions: . lactated ringers Stopped (10/30/18 1420)  . methocarbamol (ROBAXIN) IV       LOS: 2 days    Time spent: 32 minutes    Hosie Poisson, MD [facility]  Pager [phone]  If 7PM-7AM, please contact night-coverage [email or web address] Password [facility] 10/30/2018, 3:59 PM

## 2018-10-30 NOTE — Progress Notes (Signed)
   10/30/18 1700  Clinical Encounter Type  Visited With Health care provider;Patient not available  Visit Type Initial  Referral From Nurse   Attempted visit, pt appeared to be napping, did not disturb pt.  Spoke w/ care RN.  Myra Gianotti resident, [phone]

## 2018-10-30 NOTE — Progress Notes (Signed)
CSW consulted with patient regarding recommendation of SNF per MD. Patient reports she plans on going home and her children taking care of her. CSW informed patient that CSW had spoken to patient's oldest daughter [name] who had reported that patient's daughters that live with her are in their early 20's and would not be home or willing to take care of patient. Patient reported that this information was not true. CSW reiterated to patient that she was refusing SNF recommendation at this time , which care team believes is safest discharge plan. Patient reports "yes" and that "it makes no sense" for her to go to a facility when she has kids taking care of her at home.   RNCM notified of patient's refusal of SNF, patient in need of Home Health PT services.   [place], [place]

## 2018-10-30 NOTE — Progress Notes (Signed)
Subjective: Patient stable.  She seems more interactive today.  Having difficulty nonweightbearing left leg.  She is moving her foot better today.   Objective: Vital signs in last 24 hours: Temp:  [98 F (36.7 C)-99.3 F (37.4 C)] 98.1 F (36.7 C) (04/23 1014) Pulse Rate:  [95-115] 109 (04/23 1014) Resp:  [16-20] 16 (04/23 0827) BP: (116-162)/(73-92) 145/80 (04/23 1014) SpO2:  [94 %-100 %] 97 % (04/23 1014)  Intake/Output from previous day: 04/22 0701 - 04/23 0700 In: 970 [P.O.:120; I.V.:850] Out: 380 [Urine:380] Intake/Output this shift: Total I/O In: -  Out: 600 [Urine:600]  Exam:  Intact pulses distally Dorsiflexion/Plantar flexion intact  Labs: Recent Labs    10/28/18 1301 10/28/18 1733 10/28/18 2137 10/29/18 0830 10/30/18 0419  HGB 10.8* 7.8* 9.8* 9.0* 8.8*   Recent Labs    10/29/18 0830 10/30/18 0419  WBC 9.9 8.5  RBC 2.80* 2.75*  HCT 24.9* 24.8*  PLT 171 160   Recent Labs    10/29/18 0830 10/30/18 0419  NA 136 137  K 3.7 3.4*  CL 105 108  CO2 22 20*  BUN 19 18  CREATININE 1.29* 1.35*  GLUCOSE 188* 110*  CALCIUM 8.0* 8.2*   No results for input(s): LABPT, INR in the last 72 hours.  Assessment/Plan: Plan at this time is psych consult although she does seem better to me today.  We also need to get skilled nursing home placement in order.  She is medically ready.  Need to discontinue the [place] tomorrow morning.   Landry Dyke Dean 10/30/2018, 1:07 PM

## 2018-10-30 NOTE — Progress Notes (Signed)
Physical Therapy Treatment Patient Details Name: Melanie Hall MRN: [id number] DOB: [birth year] Today's Date: 10/30/2018    History of Present Illness Pt is a 48 y.o. female admitted 10/28/18 after fall sustaining L distal femur fx; now s/p L femoral IM nail 4/21. PMH includes HTN, DM, anemia, cataracts.     PT Comments    Pt performed gait training, functional mobility and LE strengthening on L side.  Pt remains guarded due to pain and eager to return home.  Pt very self limiting.  She continues to benefit from HHPT to improve strength and function while recovering at home.   Follow Up Recommendations  Home health PT;Supervision for mobility/OOB(reports children can assist her at home)     Equipment Recommendations  Rolling walker with 5" wheels;3in1 (PT)    Recommendations for Other Services       Precautions / Restrictions Precautions Precautions: Fall Required Braces or Orthoses: Knee Immobilizer - Left Restrictions Weight Bearing Restrictions: Yes LLE Weight Bearing: Non weight bearing    Mobility  Bed Mobility Overal bed mobility: (Pt up in chair on arrival assisted patient with return to bed.  ) Bed Mobility: Sit to Supine       Sit to supine: Min assist   General bed mobility comments: Pt required increased time and effort.  Assistance to lift LLE against gravity back to bed.    Transfers Overall transfer level: Needs assistance Equipment used: Rolling walker (2 wheeled) Transfers: Sit to/from Stand Sit to Stand: Min guard         General transfer comment: Pt continues to tell therapist, "Let me do it on my own so I can go home."  Pt unsuccessful with first attempt and returned to seated position.  min guard for second trial with cues for technique and hand placement. Poor eccentric loading returning to bed.    Ambulation/Gait Ambulation/Gait assistance: Min assist Gait Distance (Feet): 10 Feet Assistive device: Rolling walker (2 wheeled) Gait  Pattern/deviations: Step-to pattern;Decreased weight shift to left;Antalgic Gait velocity: Decreased   General Gait Details: min assistance for RW advancement and safety.  Pt not following commands and moves more in a rushed pattern dragging LLE TDWB despite cues for NWB.  Appears to not be weight bearing nor recepetive to cues.     Stairs             Wheelchair Mobility    Modified Rankin (Stroke Patients Only)       Balance Overall balance assessment: Needs assistance Sitting-balance support: No upper extremity supported;Feet supported Sitting balance-Leahy Scale: Good       Standing balance-Leahy Scale: Poor Standing balance comment: Reliant on UE support                            Cognition Arousal/Alertness: Awake/alert Behavior During Therapy: Flat affect Overall Cognitive Status: Within Functional Limits for tasks assessed                                 General Comments: WFL for simple tasks      Exercises General Exercises - Lower Extremity Ankle Circles/Pumps: AROM;Both;10 reps;Supine Quad Sets: AROM;10 reps;Supine;Left Short Arc Quad: Left;10 reps;Supine;AAROM Heel Slides: AAROM;10 reps;Supine;Left Hip ABduction/ADduction: AAROM;Left;10 reps;Supine    General Comments        Pertinent Vitals/Pain Pain Assessment: 0-10 Pain Score: (15/10) Pain Location: L knee Pain Descriptors / Indicators: Grimacing;Guarding;Operative  site guarding Pain Intervention(s): Monitored during session;Repositioned    Home Living Family/patient expects to be discharged to:: Private residence Living Arrangements: Children Available Help at Discharge: Family;Available PRN/intermittently Type of Home: House Home Access: Stairs to enter Entrance Stairs-Rails: None Home Layout: One level Home Equipment: Hand held shower head Additional Comments: Lives with 2 daughters (36 y.o. and 64 y.o. I believe) who can assist as needed    Prior  Function Level of Independence: Independent          PT Goals (current goals can now be found in the care plan section) Acute Rehab PT Goals Patient Stated Goal: Return home with help from daughters Potential to Achieve Goals: Good Progress towards PT goals: Progressing toward goals    Frequency    Min 5X/week      PT Plan Current plan remains appropriate    Co-evaluation              AM-PAC PT "6 Clicks" Mobility   Outcome Measure  Help needed turning from your back to your side while in a flat bed without using bedrails?: None Help needed moving from lying on your back to sitting on the side of a flat bed without using bedrails?: None Help needed moving to and from a bed to a chair (including a wheelchair)?: A Little Help needed standing up from a chair using your arms (e.g., wheelchair or bedside chair)?: A Little Help needed to walk in hospital room?: A Little Help needed climbing 3-5 steps with a railing? : A Lot 6 Click Score: 19    End of Session Equipment Utilized During Treatment: Gait belt Activity Tolerance: Patient limited by pain Patient left: with call bell/phone within reach;in bed;with bed alarm set Nurse Communication: Mobility status PT Visit Diagnosis: Other abnormalities of gait and mobility (R26.89);Pain Pain - Right/Left: Left Pain - part of body: Leg     Time: 6384-6659 PT Time Calculation (min) (ACUTE ONLY): 14 min  Charges:  $Therapeutic Activity: 8-22 mins                     Governor Rooks, PTA Acute Rehabilitation Services Pager [phone] Office [phone]     Melanie Hall Eli Hose 10/30/2018, 4:51 PM

## 2018-10-30 NOTE — Progress Notes (Signed)
Regarding the psychiatry issue.  I called and talked to the psychiatrist.  The patient is currently not suicidal.  The patient has to want to talk to the mental health professional.  I do not really think is the case right now.  She can follow-up as an outpatient.  If the situation changes and the patient does desire to talk to mental health professional such as a psychiatrist while she is an inpatient then let me know and I will reconsult.

## 2018-10-31 LAB — BASIC METABOLIC PANEL
Anion gap: 8 (ref 5–15)
BUN: 21 mg/dL — ABNORMAL HIGH (ref 6–20)
CO2: 21 mmol/L — ABNORMAL LOW (ref 22–32)
Calcium: 8 mg/dL — ABNORMAL LOW (ref 8.9–10.3)
Chloride: 106 mmol/L (ref 98–111)
Creatinine, Ser: 1.39 mg/dL — ABNORMAL HIGH (ref 0.44–1.00)
GFR calc Af Amer: 52 mL/min — ABNORMAL LOW (ref >60)
GFR calc non Af Amer: 45 mL/min — ABNORMAL LOW (ref >60)
Glucose, Bld: 188 mg/dL — ABNORMAL HIGH (ref 70–99)
Potassium: 3.8 mmol/L (ref 3.5–5.1)
Sodium: 135 mmol/L (ref 135–145)

## 2018-10-31 LAB — CBC
HCT: 22.4 % — ABNORMAL LOW (ref 36.0–46.0)
Hemoglobin: 7.9 g/dL — ABNORMAL LOW (ref 12.0–15.0)
MCH: 32.2 pg (ref 26.0–34.0)
MCHC: 35.3 g/dL (ref 30.0–36.0)
MCV: 91.4 fL (ref 80.0–100.0)
Platelets: 139 10*3/uL — ABNORMAL LOW (ref 150–400)
RBC: 2.45 MIL/uL — ABNORMAL LOW (ref 3.87–5.11)
RDW: 12.4 % (ref 11.5–15.5)
WBC: 8.9 10*3/uL (ref 4.0–10.5)
nRBC: 0 % (ref 0.0–0.2)

## 2018-10-31 LAB — GLUCOSE, CAPILLARY
Glucose-Capillary: 94 mg/dL (ref 70–99)
Glucose-Capillary: 75 mg/dL (ref 70–99)
Glucose-Capillary: 92 mg/dL (ref 70–99)
Glucose-Capillary: 132 mg/dL — ABNORMAL HIGH (ref 70–99)

## 2018-10-31 NOTE — Progress Notes (Signed)
Occupational Therapy Treatment Patient Details Name: Melanie Hall MRN: [id number] DOB: 09-Aug-1970 Today's Date: 10/31/2018    History of present illness Pt is a 48 y.o. female admitted 10/28/18 after fall sustaining L distal femur fx; now s/p L femoral IM nail 4/21. PMH includes HTN, DM, anemia, cataracts.    OT comments  Pt progressing with therapy however continues to be self limiting (refusing to attempt toileting hygiene and requesting therapist to perform it for her).  Therapist educated pt on use of reacher and long handled sponge for use during LB ADLs at home. Pt demonstrated limited participation in use of reacher. Would benefit from continued AE education next session. Continue to recommend [place] for discharge home. Will continue to follow acutely.   Follow Up Recommendations  Home health OT;Supervision/Assistance - 24 hour    Equipment Recommendations  3 in 1 bedside commode    Recommendations for Other Services      Precautions / Restrictions Precautions Precautions: Fall Restrictions Weight Bearing Restrictions: Yes LLE Weight Bearing: Non weight bearing       Mobility Bed Mobility Overal bed mobility: (pt up with PT upon OT arrival)  Transfers Overall transfer level: Needs assistance Equipment used: Rolling walker (2 wheeled) Transfers: Sit to/from [facility] Sit to Stand: Min assist Stand pivot transfers: Min guard       General transfer comment: Min assist to power up from [facility]. Pt able to maintain NWB status throughout.    Balance                             ADL either performed or assessed with clinical judgement   ADL Overall ADL's : Needs assistance/impaired     Grooming: Wash/dry hands;Sitting;Independent;Brushing hair                   Toilet Transfer: Minimal assistance;BSC;RW   Toileting- Clothing Manipulation and Hygiene: Total assistance;Sit to/from stand Toileting - Clothing Manipulation Details  (indicate cue type and reason): pt refusing to perform toileting hygiene and told therapist to help her, stating "I can do it when I'm home. I just need you to do it right now."       General ADL Comments: Pt sitting on BSC at end of PT session.  Stands with min assist with RW to transfer to recliner.  Completes grooming tasks sitting in recliner.  Therapist educated on use of reacher and long handled sponge and provided both for patient.  Pt declining sock aid stating "I don't wear socks at home."     Vision       Perception     Praxis      Cognition Arousal/Alertness: Awake/alert Behavior During Therapy: Flat affect Overall Cognitive Status: Within Functional Limits for tasks assessed                                 General Comments: WFL for simple tasks        Exercises   Shoulder Instructions       General Comments      Pertinent Vitals/ Pain       Pain Assessment: 0-10 Pain Score: (15/10 pain level) Faces Pain Scale: Hurts even more Pain Location: L knee Pain Descriptors / Indicators: Grimacing;Guarding;Operative site guarding Pain Intervention(s): Monitored during session;Repositioned  Home Living  Prior Functioning/Environment              Frequency  Min 3X/week        Progress Toward Goals  OT Goals(current goals can now be found in the care plan section)  Progress towards OT goals: Progressing toward goals  Acute Rehab OT Goals Patient Stated Goal: Return home with help from daughters OT Goal Formulation: With patient Time For Goal Achievement: 11/13/18 Potential to Achieve Goals: Fair ADL Goals Pt Will Perform Lower Body Bathing: with min guard assist;with adaptive equipment;sit to/from stand Pt Will Perform Lower Body Dressing: with min guard assist;sit to/from stand;with adaptive equipment Pt Will Transfer to Toilet: with min guard assist;ambulating;bedside  commode Pt Will Perform Toileting - Clothing Manipulation and hygiene: with min guard assist;sit to/from stand Pt Will Perform Tub/Shower Transfer: Tub transfer;with min assist;ambulating;3 in 1;rolling walker  Plan Discharge plan remains appropriate    Co-evaluation                 AM-PAC OT "6 Clicks" Daily Activity     Outcome Measure   Help from another person eating meals?: None Help from another person taking care of personal grooming?: None Help from another person toileting, which includes using toliet, bedpan, or urinal?: A Lot Help from another person bathing (including washing, rinsing, drying)?: A Lot Help from another person to put on and taking off regular upper body clothing?: None Help from another person to put on and taking off regular lower body clothing?: A Lot 6 Click Score: 18    End of Session Equipment Utilized During Treatment: Rolling walker  OT Visit Diagnosis: Unsteadiness on feet (R26.81);History of falling (Z91.81);Pain Pain - Right/Left: Left Pain - part of body: Leg;Hip   Activity Tolerance Patient tolerated treatment well   Patient Left in chair;with call bell/phone within reach;with chair alarm set   Nurse Communication Mobility status        Time: 6160-7371 OT Time Calculation (min): 16 min  Charges: OT General Charges $OT Visit: 1 Visit OT Treatments $Self Care/Home Management : 8-22 mins     Darrol Jump OTR/L [place] [phone] 10/31/2018, 3:57 PM

## 2018-10-31 NOTE — Progress Notes (Signed)
Subjective: Patient stable.  Having nausea.   Objective: Vital signs in last 24 hours: Temp:  [98 F (36.7 C)-98.3 F (36.8 C)] 98.2 F (36.8 C) (04/24 0456) Pulse Rate:  [93-108] 93 (04/24 0456) Resp:  [14] 14 (04/23 2153) BP: (131-141)/(80-89) 131/81 (04/24 0456) SpO2:  [98 %-99 %] 99 % (04/24 0456)  Intake/Output from previous day: 04/23 0701 - 04/24 0700 In: 1508.4 [I.V.:1508.4] Out: 1150 [Urine:1150] Intake/Output this shift: No intake/output data recorded.  Exam:  Sensation intact distally Intact pulses distally Dorsiflexion/Plantar flexion intact  Labs: Recent Labs    10/28/18 1733 10/28/18 2137 10/29/18 0830 10/30/18 0419 10/31/18 0149  HGB 7.8* 9.8* 9.0* 8.8* 7.9*   Recent Labs    10/30/18 0419 10/31/18 0149  WBC 8.5 8.9  RBC 2.75* 2.45*  HCT 24.8* 22.4*  PLT 160 139*   Recent Labs    10/30/18 0419 10/31/18 0149  NA 137 135  K 3.4* 3.8  CL 108 106  CO2 20* 21*  BUN 18 21*  CREATININE 1.35* 1.39*  GLUCOSE 110* 188*  CALCIUM 8.2* 8.0*   No results for input(s): LABPT, INR in the last 72 hours.  Assessment/Plan: Plan at this time is discharge to home possibly on Sunday if she is okay with physical therapy.  Still could probably use continued medical tune up until then   [employer] 10/31/2018, 12:17 PM

## 2018-10-31 NOTE — Progress Notes (Signed)
PROGRESS NOTE    Melanie Hall  OZD:[id number] DOB: [birth year] DOA: 10/28/2018 PCP: Patient, No Pcp Per     Brief Narrative: Melanie Hall is a 48 year old female withmedical history significant for type 2 diabetes, hypertension, and normocytic anemia who presented to the ED after suffering a mechanical fall when she slipped at home.   In the ED her initial vitals showed BP 173/97, pulse 102, RR 16, temp 97.7 Fahrenheit, SPO2 99% on room air. WBC 10.1, hemoglobin 10.8, MCV 88, platelets 235, sodium 134, potassium 3.7, serum glucose 345, BUN 17, creatinine 1.15. Left knee x-ray showed an oblique angulated supracondylar fracture of the distal femur. Orthopedics, Dr. Marlou Sa, was consulted and patient was taken to the OR for IM nailing and reduction of the condyles of supracondylar femur fracture.    Patient was seen in the PACU postoperatively, Postop, repeat hemoglobin was reported to decrease to7.8. Estimated operative blood loss was approximately 75 mL's per op note. Shewas given 1 unit transfusion of PRBC. She was also noted to be hyperglycemic and started on a continuous insulin infusion. The hospitalist service was consulted to assist with medical management of her anemia, diabetes, and hypertension   Assessment & Plan:   Principal Problem:   Closed fracture of left femur ([place]) Active Problems:   Uncontrolled diabetes mellitus with hyperglycemia (HCC)   Normocytic anemia   Hypertension associated with diabetes ([place])    Left distal femur fracture secondary to mechanical fall Orthopedics is the primary and patient underwent intramedullary nail placement. Nonweightbearing status to the left lower extremity as per orthopedist recommendations.   Type 2 diabetes mellitus   CBG (last 3)  Recent Labs    10/30/18 2151 10/31/18 0751 10/31/18 1127  GLUCAP 160* 94 75   A1c is 13 on admission poorly controlled probably secondary to noncompliance to medications currently on  Levemir 15 units daily. Case manager consult for medication assistance will be requested. Encourage oral intake .    Essential  hypertension Better controlled , lisinopril on hold for worsening renal para meters.  PRN IV hydralazine on board   Acute anemia of blood loss from the surgery. S/p 1 unit of PRBC transfusion Hemoglobin hovering between 8-9. Continue to monitor. Transfuse to keep hemoglobin greater than 7.    AKI probably from dehydration and prolonged n.p.o.Marland Kitchen poor oral intake.  Hydrated, recommend to encourage to oral intake.    DVT prophylaxis: Lovenox Code Status: Full code Family Communication: None at bedside  disposition Plan: Pending clinical improvement   Procedures: Surgical repair of the distal femur fracture Antimicrobials: None  Subjective: HEADACHE today. Answering simple questions.    Objective: Vitals:   10/30/18 1014 10/30/18 1308 10/30/18 2153 10/31/18 0456  BP: (!) 145/80 (!) 141/80 132/89 131/81  Pulse: (!) 109 (!) 108 (!) 101 93  Resp:   14   Temp: 98.1 F (36.7 C) 98 F (36.7 C) 98.3 F (36.8 C) 98.2 F (36.8 C)  TempSrc: Oral Oral Oral Oral  SpO2: 97% 99% 98% 99%  Weight:      Height:        Intake/Output Summary (Last 24 hours) at 10/31/2018 1209 Last data filed at 10/31/2018 0500 Gross per 24 hour  Intake 1508.35 ml  Output 1150 ml  Net 358.35 ml   Filed Weights   10/28/18 1153 10/28/18 1524  Weight: 61.2 kg 61.2 kg    Examination:  General exam: Appears calm and comfortable  Respiratory system: Clear to auscultation. Respiratory effort normal.  Cardiovascular system: S1 & S2 heard, RRR No pedal edema. Gastrointestinal system: Abdomen is nondistended, soft and nontender. Normal bowel sounds heard. Central nervous system: Alert and answering simple questions.  Extremities: no pedal edema.  Skin: No rashes, lesions or ulcers Psychiatry:  Mood & affect appropriate.     Data Reviewed: I have personally reviewed  following labs and imaging studies  CBC: Recent Labs  Lab 10/28/18 1301 10/28/18 1733 10/28/18 2137 10/29/18 0830 10/30/18 0419 10/31/18 0149  WBC 10.1  --   --  9.9 8.5 8.9  NEUTROABS 7.5  --   --   --   --   --   HGB 10.8* 7.8* 9.8* 9.0* 8.8* 7.9*  HCT 30.0* 23.0* 27.4* 24.9* 24.8* 22.4*  MCV 88.0  --   --  88.9 90.2 91.4  PLT 235  --   --  171 160 622*   Basic Metabolic Panel: Recent Labs  Lab 10/28/18 1301 10/28/18 1733 10/29/18 0830 10/30/18 0419 10/31/18 0149  NA 134* 137 136 137 135  K 3.7 3.8 3.7 3.4* 3.8  CL 104  --  105 108 106  CO2 23  --  22 20* 21*  GLUCOSE 345* 319* 188* 110* 188*  BUN 17  --  19 18 21*  CREATININE 1.15*  --  1.29* 1.35* 1.39*  CALCIUM 8.1*  --  8.0* 8.2* 8.0*   GFR: Estimated Creatinine Clearance: 42.6 mL/min (A) (by C-G formula based on SCr of 1.39 mg/dL (H)). Liver Function Tests: No results for input(s): AST, ALT, ALKPHOS, BILITOT, PROT, ALBUMIN in the last 168 hours. No results for input(s): LIPASE, AMYLASE in the last 168 hours. No results for input(s): AMMONIA in the last 168 hours. Coagulation Profile: No results for input(s): INR, PROTIME in the last 168 hours. Cardiac Enzymes: No results for input(s): CKTOTAL, CKMB, CKMBINDEX, TROPONINI in the last 168 hours. BNP (last 3 results) No results for input(s): PROBNP in the last 8760 hours. HbA1C: No results for input(s): HGBA1C in the last 72 hours. CBG: Recent Labs  Lab 10/30/18 1205 10/30/18 1730 10/30/18 2151 10/31/18 0751 10/31/18 1127  GLUCAP 189* 214* 160* 94 75   Lipid Profile: No results for input(s): CHOL, HDL, LDLCALC, TRIG, CHOLHDL, LDLDIRECT in the last 72 hours. Thyroid Function Tests: No results for input(s): TSH, T4TOTAL, FREET4, T3FREE, THYROIDAB in the last 72 hours. Anemia Panel: No results for input(s): VITAMINB12, FOLATE, FERRITIN, TIBC, IRON, RETICCTPCT in the last 72 hours. Sepsis Labs: No results for input(s): PROCALCITON, LATICACIDVEN in the  last 168 hours.  No results found for this or any previous visit (from the past 240 hour(s)).       Radiology Studies: No results found.      Scheduled Meds: . aspirin EC  81 mg Oral BID  . docusate sodium  100 mg Oral BID  . enoxaparin (LOVENOX) injection  40 mg Subcutaneous Q24H  . insulin aspart  0-5 Units Subcutaneous QHS  . insulin aspart  0-9 Units Subcutaneous TID WC  . insulin detemir  15 Units Subcutaneous QHS   Continuous Infusions: . methocarbamol (ROBAXIN) IV       LOS: 3 days    Time spent: 28 minutes    Hosie Poisson, MD [facility] Pager [phone]  If 7PM-7AM, please contact night-coverage [email or web address] Password [facility] 10/31/2018, 12:09 PM

## 2018-10-31 NOTE — Progress Notes (Signed)
Physical Therapy Treatment Patient Details Name: Melanie Hall MRN: [id number] DOB: 1971-03-20 Today's Date: 10/31/2018    History of Present Illness Pt is a 48 y.o. female admitted 10/28/18 after fall sustaining L distal femur fx; now s/p L femoral IM nail 4/21. PMH includes HTN, DM, anemia, cataracts.     PT Comments    Pt performed gait training and functional mobility during session.  She required max VCs and encouragement for progressing gait distance.  Pt with improved ability to maintain weight bearing.  Plan next session for progression to stair training.      Follow Up Recommendations  Home health PT;Supervision for mobility/OOB(reports children can assist her at home.  )     Equipment Recommendations  Rolling walker with 5" wheels;3in1 (PT)    Recommendations for Other Services       Precautions / Restrictions Precautions Precautions: Fall Restrictions Weight Bearing Restrictions: Yes    Mobility  Bed Mobility Overal bed mobility: Needs Assistance Bed Mobility: Supine to Sit     Supine to sit: Supervision     General bed mobility comments: Pt self assisted LLE to edge of bed.    Transfers Overall transfer level: Needs assistance Equipment used: Rolling walker (2 wheeled) Transfers: Sit to/from Stand Sit to Stand: Min guard         General transfer comment: Pt remains to be independent. min guard for safety.  Pt with good maintenance of L NWB    Ambulation/Gait Ambulation/Gait assistance: Min assist Gait Distance (Feet): 18 Feet Assistive device: Rolling walker (2 wheeled) Gait Pattern/deviations: Step-to pattern;Decreased weight shift to left;Antalgic Gait velocity: Decreased   General Gait Details: Pt continues to require min assistance to advance RW.  Pt with improved ability to maintain NWB.     Stairs             Wheelchair Mobility    Modified Rankin (Stroke Patients Only)       Balance Overall balance assessment: Needs  assistance   Sitting balance-Leahy Scale: Good       Standing balance-Leahy Scale: Poor                              Cognition Arousal/Alertness: Awake/alert Behavior During Therapy: Flat affect Overall Cognitive Status: Within Functional Limits for tasks assessed                                 General Comments: WFL for simple tasks      Exercises General Exercises - Lower Extremity Ankle Circles/Pumps: AROM;Both;10 reps;Supine Quad Sets: AROM;10 reps;Supine;Left Heel Slides: AAROM;10 reps;Supine;Left Hip ABduction/ADduction: AAROM;Left;10 reps;Supine    General Comments        Pertinent Vitals/Pain Pain Assessment: Faces Faces Pain Scale: Hurts even more Pain Location: L knee Pain Descriptors / Indicators: Grimacing;Guarding;Operative site guarding Pain Intervention(s): Monitored during session;Repositioned    Home Living                      Prior Function            PT Goals (current goals can now be found in the care plan section) Acute Rehab PT Goals Patient Stated Goal: Return home with help from daughters Potential to Achieve Goals: Good Progress towards PT goals: Progressing toward goals    Frequency    Min 5X/week      PT  Plan Current plan remains appropriate    Co-evaluation              AM-PAC PT "6 Clicks" Mobility   Outcome Measure  Help needed turning from your back to your side while in a flat bed without using bedrails?: None Help needed moving from lying on your back to sitting on the side of a flat bed without using bedrails?: None Help needed moving to and from a bed to a chair (including a wheelchair)?: A Little Help needed standing up from a chair using your arms (e.g., wheelchair or bedside chair)?: A Little Help needed to walk in hospital room?: A Little Help needed climbing 3-5 steps with a railing? : A Lot 6 Click Score: 19    End of Session Equipment Utilized During Treatment:  Gait belt Activity Tolerance: Patient limited by pain Patient left: with call bell/phone within reach;in bed;with bed alarm set Nurse Communication: Mobility status PT Visit Diagnosis: Other abnormalities of gait and mobility (R26.89);Pain Pain - Right/Left: Left Pain - part of body: Leg     Time: 1446-1500 PT Time Calculation (min) (ACUTE ONLY): 14 min  Charges:  $Gait Training: 8-22 mins                     Governor Rooks, PTA Acute Rehabilitation Services Pager [phone] Office [phone]     Cierrah Dace Eli Hose 10/31/2018, 3:09 PM

## 2018-10-31 NOTE — TOC Transition Note (Signed)
Transition of Care [facility]) - CM/SW Discharge Note   Patient Details  Name: Melanie Hall MRN: [id number] Date of Birth: 10/16/1970  Transition of Care [facility]) CM/SW Contact:  Marilu Favre, RN Phone Number: 10/31/2018, 10:36 AM   Clinical Narrative:      Lyanne Co with [place] for walker and [place] with [facility] has HHPT orders. Final next level of care: [place] Barriers to Discharge: Continued Medical Work up   Patient Goals and CMS Choice Patient states their goals for this hospitalization and ongoing recovery are:: to go home  CMS Medicare.gov Compare Post Acute Care list provided to:: Patient Choice offered to / list presented to : Patient  Discharge Placement                       Discharge Plan and Services   Discharge Planning Services: CM Consult            DME Arranged: 3-N-1, Walker rolling DME Agency: [employer] Date DME Agency Contacted: 10/31/18 Time DME Agency Contacted: 1034 Representative spoke with at DME Agency: [place]: PT [place]: [facility] (formerly [employer]) Date [place]: 10/31/18 Time [place]: [place] ([place]) Interventions     Readmission Risk Interventions No flowsheet data found.

## 2018-11-01 LAB — BPAM RBC
Blood Product Expiration Date: 202005082359
Blood Product Expiration Date: 202005092359
ISSUE DATE / TIME: 202004211804
ISSUE DATE / TIME: 202004211804
Unit Type and Rh: 9500
Unit Type and Rh: 9500

## 2018-11-01 LAB — TYPE AND SCREEN
ABO/RH(D): O NEG
Antibody Screen: NEGATIVE
Unit division: 0
Unit division: 0

## 2018-11-01 LAB — CBC
HCT: 24 % — ABNORMAL LOW (ref 36.0–46.0)
Hemoglobin: 8.3 g/dL — ABNORMAL LOW (ref 12.0–15.0)
MCH: 31.9 pg (ref 26.0–34.0)
MCHC: 34.6 g/dL (ref 30.0–36.0)
MCV: 92.3 fL (ref 80.0–100.0)
Platelets: 173 10*3/uL (ref 150–400)
RBC: 2.6 MIL/uL — ABNORMAL LOW (ref 3.87–5.11)
RDW: 12.1 % (ref 11.5–15.5)
WBC: 7.3 10*3/uL (ref 4.0–10.5)
nRBC: 0 % (ref 0.0–0.2)

## 2018-11-01 LAB — BASIC METABOLIC PANEL
Anion gap: 10 (ref 5–15)
BUN: 18 mg/dL (ref 6–20)
CO2: 21 mmol/L — ABNORMAL LOW (ref 22–32)
Calcium: 8 mg/dL — ABNORMAL LOW (ref 8.9–10.3)
Chloride: 105 mmol/L (ref 98–111)
Creatinine, Ser: 1.29 mg/dL — ABNORMAL HIGH (ref 0.44–1.00)
GFR calc Af Amer: 57 mL/min — ABNORMAL LOW (ref >60)
GFR calc non Af Amer: 49 mL/min — ABNORMAL LOW (ref >60)
Glucose, Bld: 96 mg/dL (ref 70–99)
Potassium: 3.9 mmol/L (ref 3.5–5.1)
Sodium: 136 mmol/L (ref 135–145)

## 2018-11-01 LAB — GLUCOSE, CAPILLARY
Glucose-Capillary: 69 mg/dL — ABNORMAL LOW (ref 70–99)
Glucose-Capillary: 91 mg/dL (ref 70–99)
Glucose-Capillary: 79 mg/dL (ref 70–99)
Glucose-Capillary: 85 mg/dL (ref 70–99)

## 2018-11-01 MED ORDER — INSULIN DETEMIR 100 UNIT/ML ~~LOC~~ SOLN
10.0000 [IU] | Freq: Every day | SUBCUTANEOUS | Status: DC
  Administered 2018-11-01: 23:00:00 10 [IU] via SUBCUTANEOUS
  Filled 2018-11-01 (×2): qty 0.1

## 2018-11-01 NOTE — Progress Notes (Signed)
Physical Therapy Treatment Patient Details Name: Melanie Hall MRN: [id number] DOB: [birth year] Today's Date: 11/01/2018    History of Present Illness Pt is a 48 y.o. female admitted 10/28/18 after fall sustaining L distal femur fx; now s/p L femoral IM nail 4/21. PMH includes HTN, DM, anemia, cataracts.     PT Comments    Pt reporting that she fell earlier this morning when attempting to get back to bed from [facility]; however, with more investigation pt reporting that she almost fell and losing control with descent back to bed. Pt declining any OOB activity or further therex this session secondary to increased pain. Pt's RN was notified. Pt would continue to benefit from skilled physical therapy services at this time while admitted and after d/c to address the below listed limitations in order to improve overall safety and independence with functional mobility.    Follow Up Recommendations  Home health PT;Supervision for mobility/OOB     Equipment Recommendations  Rolling walker with 5" wheels;3in1 (PT)    Recommendations for Other Services       Precautions / Restrictions Precautions Precautions: Fall Restrictions Weight Bearing Restrictions: Yes LLE Weight Bearing: Non weight bearing    Mobility  Bed Mobility Overal bed mobility: Needs Assistance Bed Mobility: Supine to Sit;Sit to Supine     Supine to sit: Supervision Sit to supine: Supervision   General bed mobility comments: pt able to achieve long sitting in bed with supervision (pt only agreeable to sit upright in bed)  Transfers                 General transfer comment: pt refusing  Ambulation/Gait                 Stairs             Wheelchair Mobility    Modified Rankin (Stroke Patients Only)       Balance Overall balance assessment: Needs assistance Sitting-balance support: No upper extremity supported;Feet supported Sitting balance-Leahy Scale: Fair                                      Cognition Arousal/Alertness: Awake/alert Behavior During Therapy: Flat affect Overall Cognitive Status: Within Functional Limits for tasks assessed                                 General Comments: WFL for simple tasks      Exercises General Exercises - Lower Extremity Ankle Circles/Pumps: AROM;Both;10 reps;Supine    General Comments        Pertinent Vitals/Pain Pain Assessment: 0-10 Pain Score: 10-Worst pain ever(pt reporting "20/10" as current pain) Pain Location: L knee Pain Descriptors / Indicators: Grimacing;Guarding;Operative site guarding Pain Intervention(s): Monitored during session    Home Living                      Prior Function            PT Goals (current goals can now be found in the care plan section) Acute Rehab PT Goals PT Goal Formulation: With patient Time For Goal Achievement: 11/12/18 Potential to Achieve Goals: Good Progress towards PT goals: Progressing toward goals    Frequency    Min 5X/week      PT Plan Current plan remains appropriate    Co-evaluation  AM-PAC PT "6 Clicks" Mobility   Outcome Measure  Help needed turning from your back to your side while in a flat bed without using bedrails?: A Little Help needed moving from lying on your back to sitting on the side of a flat bed without using bedrails?: None Help needed moving to and from a bed to a chair (including a wheelchair)?: A Little Help needed standing up from a chair using your arms (e.g., wheelchair or bedside chair)?: A Little Help needed to walk in hospital room?: A Little Help needed climbing 3-5 steps with a railing? : A Lot 6 Click Score: 18    End of Session   Activity Tolerance: Patient limited by pain Patient left: in bed;with call bell/phone within reach;with bed alarm set Nurse Communication: Mobility status PT Visit Diagnosis: Other abnormalities of gait and mobility (R26.89);Pain Pain -  Right/Left: Left Pain - part of body: Leg     Time: 1051-1106 PT Time Calculation (min) (ACUTE ONLY): 15 min  Charges:  $Therapeutic Activity: 8-22 mins                     Sherie Don, PT, DPT  Acute Rehabilitation Services Pager [phone] Office [place] 11/01/2018, 1:46 PM

## 2018-11-01 NOTE — Progress Notes (Signed)
PROGRESS NOTE    Melanie Hall  QIO:[id number] DOB: 1971/04/12 DOA: 10/28/2018 PCP: Patient, No Pcp Per     Brief Narrative: Melanie Hall is a 48 year old female withmedical history significant for type 2 diabetes, hypertension, and normocytic anemia who presented to the ED after suffering a mechanical fall when she slipped at home.   In the ED her initial vitals showed BP 173/97, pulse 102, RR 16, temp 97.7 Fahrenheit, SPO2 99% on room air. WBC 10.1, hemoglobin 10.8, MCV 88, platelets 235, sodium 134, potassium 3.7, serum glucose 345, BUN 17, creatinine 1.15. Left knee x-ray showed an oblique angulated supracondylar fracture of the distal femur. Orthopedics, Dr. Marlou Sa, was consulted and patient was taken to the OR for IM nailing and reduction of the condyles of supracondylar femur fracture.    Patient was seen in the PACU postoperatively, Postop, repeat hemoglobin was reported to decrease to7.8. Estimated operative blood loss was approximately 75 mL's per op note. Shewas given 1 unit transfusion of PRBC. She was also noted to be hyperglycemic and started on a continuous insulin infusion. The hospitalist service was consulted to assist with medical management of her anemia, diabetes, and hypertension   Assessment & Plan:   Principal Problem:   Closed fracture of left femur ([place]) Active Problems:   Uncontrolled diabetes mellitus with hyperglycemia (HCC)   Normocytic anemia   Hypertension associated with diabetes ([place])    Left distal femur fracture secondary to mechanical fall Orthopedics is the primary and patient underwent intramedullary nail placement. Nonweightbearing status to the left lower extremity as per orthopedist recommendations.   Type 2 diabetes mellitus   CBG (last 3)  Recent Labs    10/31/18 2111 11/01/18 0823 11/01/18 1224  GLUCAP 132* 69* 91   A1c is 13 on admission poorly controlled probably secondary to noncompliance to medications currently on  Levemir 15 units daily, due to hypoglycemic event earlier this am, will decrease the dose of levemir to 10 units.  Case manager consult for medication assistance will be requested. Encourage oral intake .    Essential  hypertension Better controlled , lisinopril on hold for worsening renal para meters.  PRN IV hydralazine on board   Acute anemia of blood loss from the surgery. S/p 1 unit of PRBC transfusion Hemoglobin hovering between 8-9. Continue to monitor. Transfuse to keep hemoglobin greater than 7.    AKI probably from dehydration and prolonged n.p.o.Marland Kitchen poor oral intake.  Hydrated, recommend to encourage to oral intake.  Repeat bmp shows stable and improved creatinine.    DVT prophylaxis: Lovenox Code Status: Full code Family Communication: None at bedside  disposition Plan: Pending clinical improvement   Procedures: Surgical repair of the distal femur fracture Antimicrobials: None  Subjective: Headache improved, no chest pain or sob. Slightly nauseated. No vomiting or abdominal pain. No BM today.    Objective: Vitals:   10/31/18 0456 10/31/18 1545 10/31/18 1952 11/01/18 0509  BP: 131/81 (!) 142/74 (!) 160/91 (!) 148/89  Pulse: 93 89 (!) 103 95  Resp:  18 18   Temp: 98.2 F (36.8 C) 98.3 F (36.8 C) 98.9 F (37.2 C) 98.4 F (36.9 C)  TempSrc: Oral Oral Oral Oral  SpO2: 99% 98% 99% 98%  Weight:      Height:        Intake/Output Summary (Last 24 hours) at 11/01/2018 1336 Last data filed at 11/01/2018 0929 Gross per 24 hour  Intake -  Output 1500 ml  Net -1500 ml  Filed Weights   10/28/18 1153 10/28/18 1524  Weight: 61.2 kg 61.2 kg    Examination:  General exam: Appears calm and comfortable  Respiratory system: Clear to auscultation. Respiratory effort normal. Cardiovascular system: S1 & S2 heard, RRR No pedal edema. Gastrointestinal system: Abdomen is nondistended, soft and nontender. Normal bowel sounds heard. Central nervous system: Alert and  answering simple questions.  Extremities: no pedal edema.  Skin: No rashes, lesions or ulcers Psychiatry:  Mood & affect appropriate.     Data Reviewed: I have personally reviewed following labs and imaging studies  CBC: Recent Labs  Lab 10/28/18 1301  10/28/18 2137 10/29/18 0830 10/30/18 0419 10/31/18 0149 11/01/18 1254  WBC 10.1  --   --  9.9 8.5 8.9 7.3  NEUTROABS 7.5  --   --   --   --   --   --   HGB 10.8*   < > 9.8* 9.0* 8.8* 7.9* 8.3*  HCT 30.0*   < > 27.4* 24.9* 24.8* 22.4* 24.0*  MCV 88.0  --   --  88.9 90.2 91.4 92.3  PLT 235  --   --  171 160 139* 173   < > = values in this interval not displayed.   Basic Metabolic Panel: Recent Labs  Lab 10/28/18 1301 10/28/18 1733 10/29/18 0830 10/30/18 0419 10/31/18 0149  NA 134* 137 136 137 135  K 3.7 3.8 3.7 3.4* 3.8  CL 104  --  105 108 106  CO2 23  --  22 20* 21*  GLUCOSE 345* 319* 188* 110* 188*  BUN 17  --  19 18 21*  CREATININE 1.15*  --  1.29* 1.35* 1.39*  CALCIUM 8.1*  --  8.0* 8.2* 8.0*   GFR: Estimated Creatinine Clearance: 42.6 mL/min (A) (by C-G formula based on SCr of 1.39 mg/dL (H)). Liver Function Tests: No results for input(s): AST, ALT, ALKPHOS, BILITOT, PROT, ALBUMIN in the last 168 hours. No results for input(s): LIPASE, AMYLASE in the last 168 hours. No results for input(s): AMMONIA in the last 168 hours. Coagulation Profile: No results for input(s): INR, PROTIME in the last 168 hours. Cardiac Enzymes: No results for input(s): CKTOTAL, CKMB, CKMBINDEX, TROPONINI in the last 168 hours. BNP (last 3 results) No results for input(s): PROBNP in the last 8760 hours. HbA1C: No results for input(s): HGBA1C in the last 72 hours. CBG: Recent Labs  Lab 10/31/18 1127 10/31/18 1646 10/31/18 2111 11/01/18 0823 11/01/18 1224  GLUCAP 75 92 132* 69* 91   Lipid Profile: No results for input(s): CHOL, HDL, LDLCALC, TRIG, CHOLHDL, LDLDIRECT in the last 72 hours. Thyroid Function Tests: No results  for input(s): TSH, T4TOTAL, FREET4, T3FREE, THYROIDAB in the last 72 hours. Anemia Panel: No results for input(s): VITAMINB12, FOLATE, FERRITIN, TIBC, IRON, RETICCTPCT in the last 72 hours. Sepsis Labs: No results for input(s): PROCALCITON, LATICACIDVEN in the last 168 hours.  No results found for this or any previous visit (from the past 240 hour(s)).       Radiology Studies: No results found.      Scheduled Meds: . aspirin EC  81 mg Oral BID  . docusate sodium  100 mg Oral BID  . enoxaparin (LOVENOX) injection  40 mg Subcutaneous Q24H  . insulin aspart  0-5 Units Subcutaneous QHS  . insulin aspart  0-9 Units Subcutaneous TID WC  . insulin detemir  10 Units Subcutaneous QHS   Continuous Infusions: . methocarbamol (ROBAXIN) IV       LOS:  4 days    Time spent:26 minutes    Hosie Poisson, MD [facility] Pager [phone]  If 7PM-7AM, please contact night-coverage [email or web address] Password [facility] 11/01/2018, 1:36 PM

## 2018-11-01 NOTE — Plan of Care (Signed)

## 2018-11-01 NOTE — Progress Notes (Signed)
Stable this morning.  No events overnight.  Complaining of hearing the ocean in her right ear.  Denies any drainage or any balance issues. Surgical dressings c/d/i. NWB LLE. Mobility is improving and should be stable for dc home this weekend.

## 2018-11-02 ENCOUNTER — Inpatient Hospital Stay (HOSPITAL_COMMUNITY): Payer: Self-pay

## 2018-11-02 LAB — COMPREHENSIVE METABOLIC PANEL
ALT: 8 U/L (ref 0–44)
AST: 12 U/L — ABNORMAL LOW (ref 15–41)
Albumin: 1.5 g/dL — ABNORMAL LOW (ref 3.5–5.0)
Alkaline Phosphatase: 68 U/L (ref 38–126)
Anion gap: 7 (ref 5–15)
BUN: 19 mg/dL (ref 6–20)
CO2: 23 mmol/L (ref 22–32)
Calcium: 8.1 mg/dL — ABNORMAL LOW (ref 8.9–10.3)
Chloride: 106 mmol/L (ref 98–111)
Creatinine, Ser: 1.26 mg/dL — ABNORMAL HIGH (ref 0.44–1.00)
GFR calc Af Amer: 58 mL/min — ABNORMAL LOW (ref >60)
GFR calc non Af Amer: 50 mL/min — ABNORMAL LOW (ref >60)
Glucose, Bld: 75 mg/dL (ref 70–99)
Potassium: 3.7 mmol/L (ref 3.5–5.1)
Sodium: 136 mmol/L (ref 135–145)
Total Bilirubin: 0.9 mg/dL (ref 0.3–1.2)
Total Protein: 4.6 g/dL — ABNORMAL LOW (ref 6.5–8.1)

## 2018-11-02 LAB — CBC
HCT: 21.6 % — ABNORMAL LOW (ref 36.0–46.0)
Hemoglobin: 7.4 g/dL — ABNORMAL LOW (ref 12.0–15.0)
MCH: 31.8 pg (ref 26.0–34.0)
MCHC: 34.3 g/dL (ref 30.0–36.0)
MCV: 92.7 fL (ref 80.0–100.0)
Platelets: 182 10*3/uL (ref 150–400)
RBC: 2.33 MIL/uL — ABNORMAL LOW (ref 3.87–5.11)
RDW: 12 % (ref 11.5–15.5)
WBC: 6.5 10*3/uL (ref 4.0–10.5)
nRBC: 0 % (ref 0.0–0.2)

## 2018-11-02 LAB — GLUCOSE, CAPILLARY
Glucose-Capillary: 67 mg/dL — ABNORMAL LOW (ref 70–99)
Glucose-Capillary: 72 mg/dL (ref 70–99)
Glucose-Capillary: 96 mg/dL (ref 70–99)
Glucose-Capillary: 72 mg/dL (ref 70–99)
Glucose-Capillary: 82 mg/dL (ref 70–99)

## 2018-11-02 IMAGING — CR LEFT FEMUR 2 VIEWS
4 series · 4 of 4 positions shown · non-contrast
Comparison: [DATE]

CLINICAL DATA: Pain.  Fall.

EXAM:
LEFT FEMUR 2 VIEWS

[femur ap (1 of 2)]
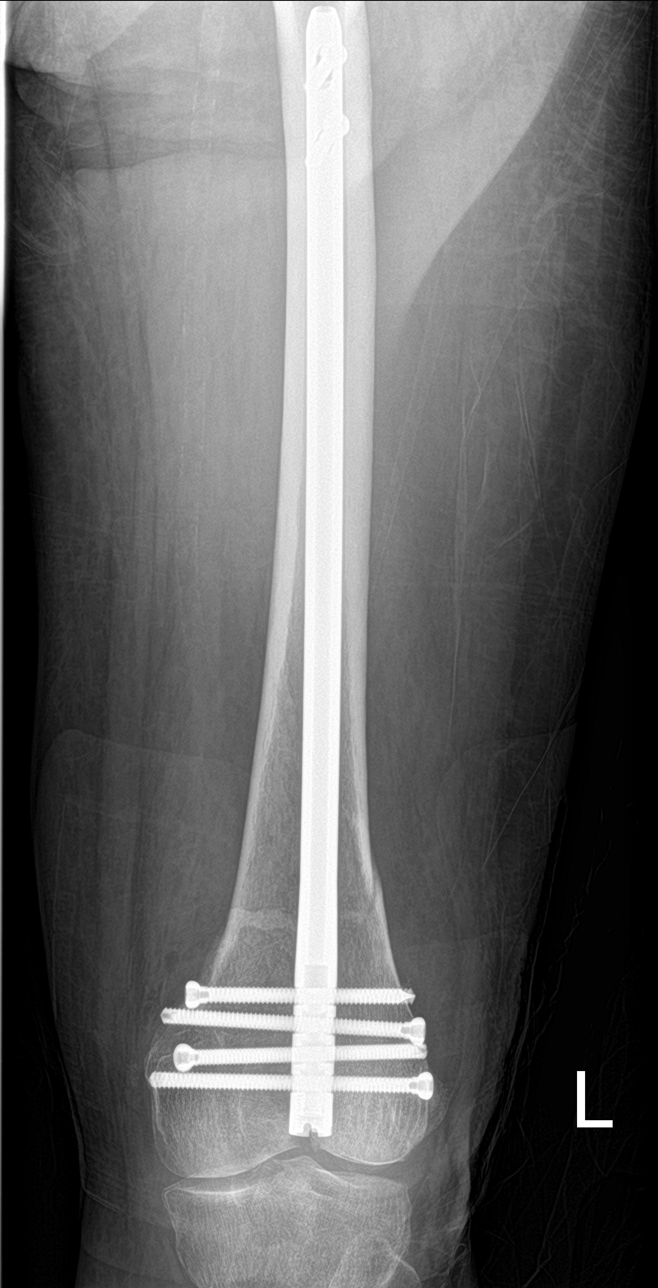

[femur ap (2 of 2)]
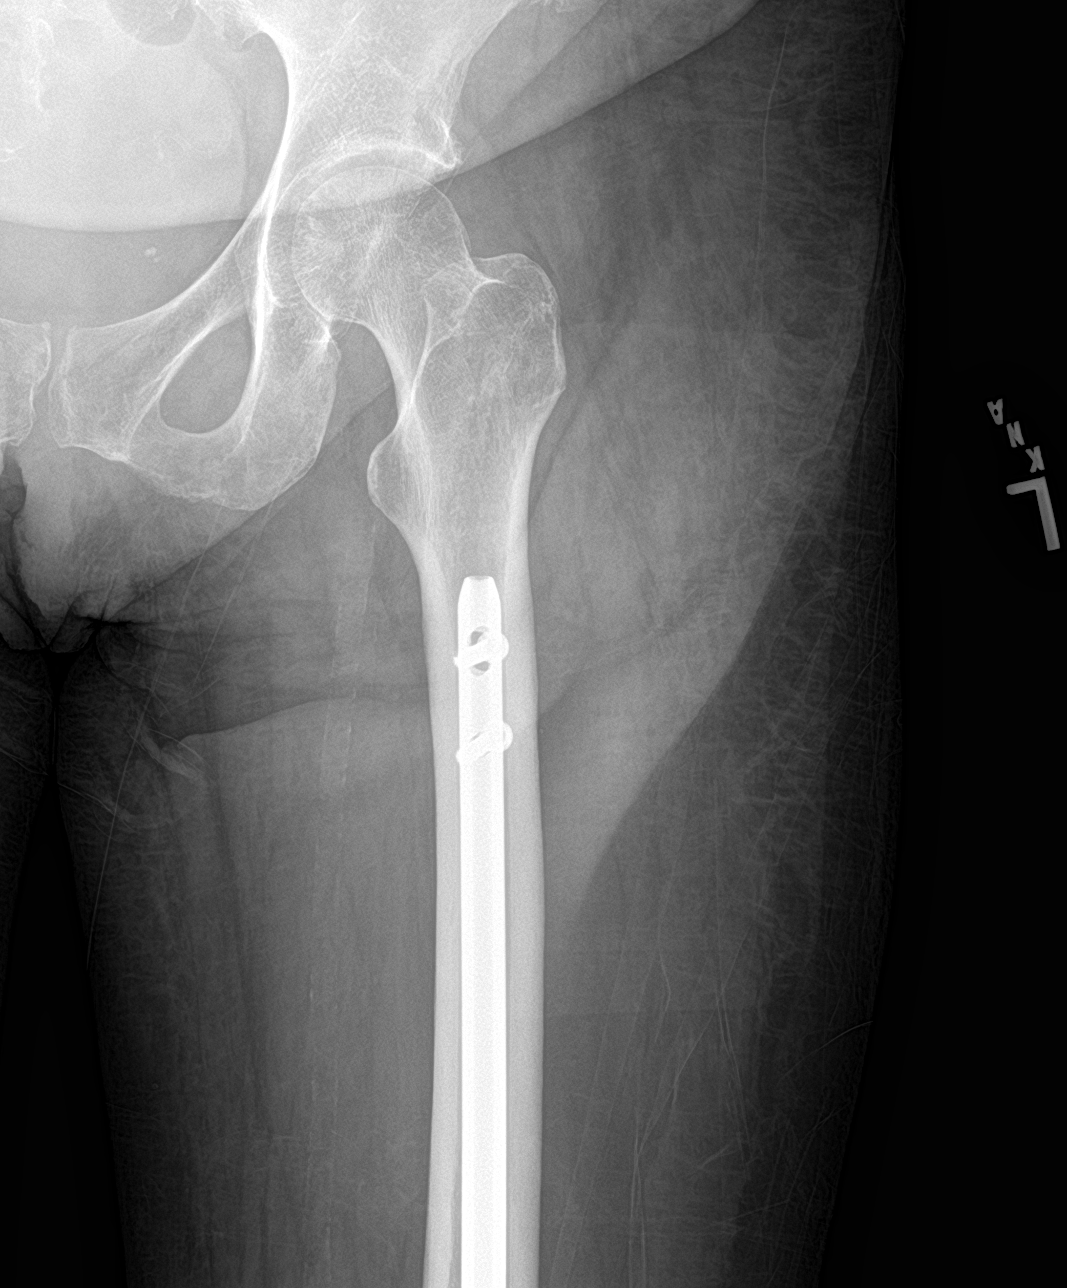

[femur lat (1 of 2)]
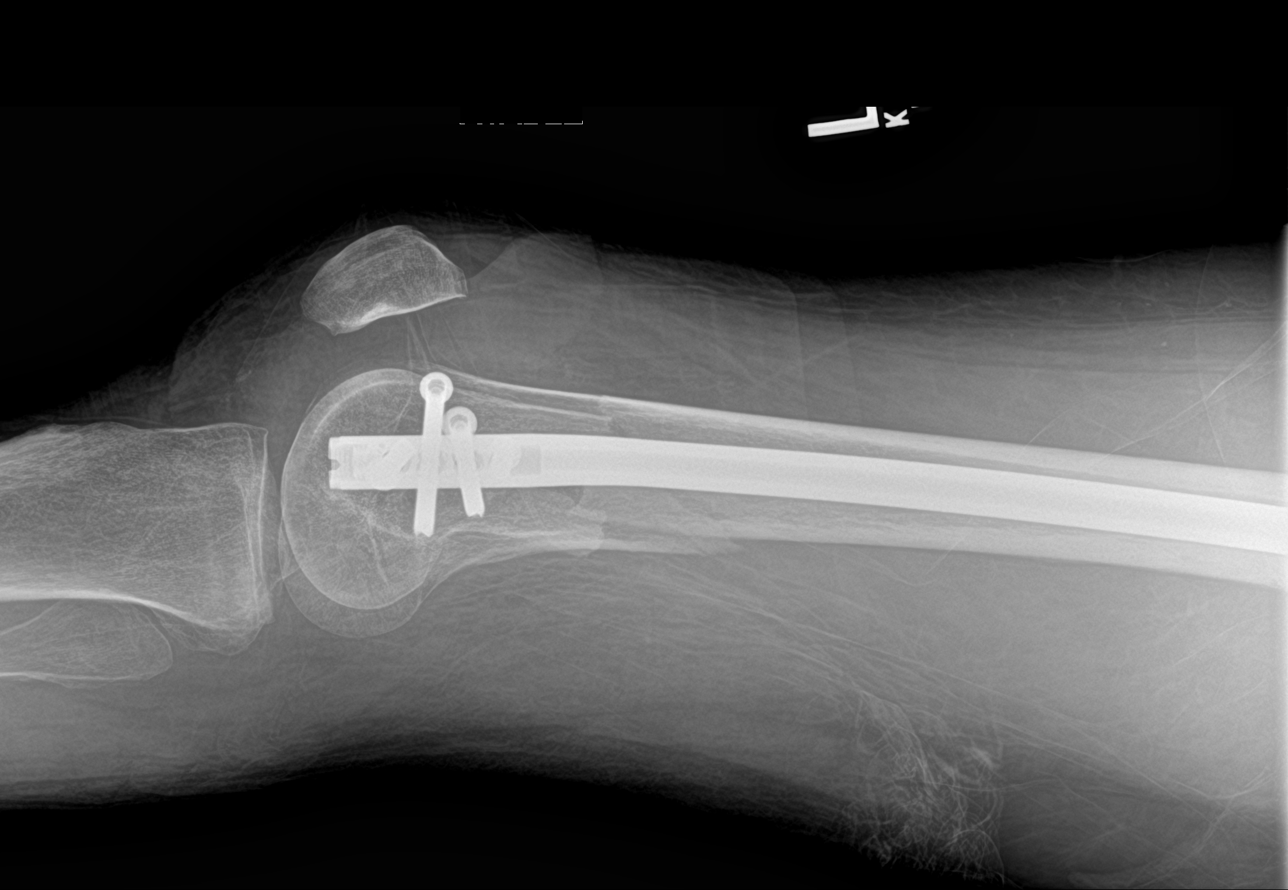

[femur lat (2 of 2)]
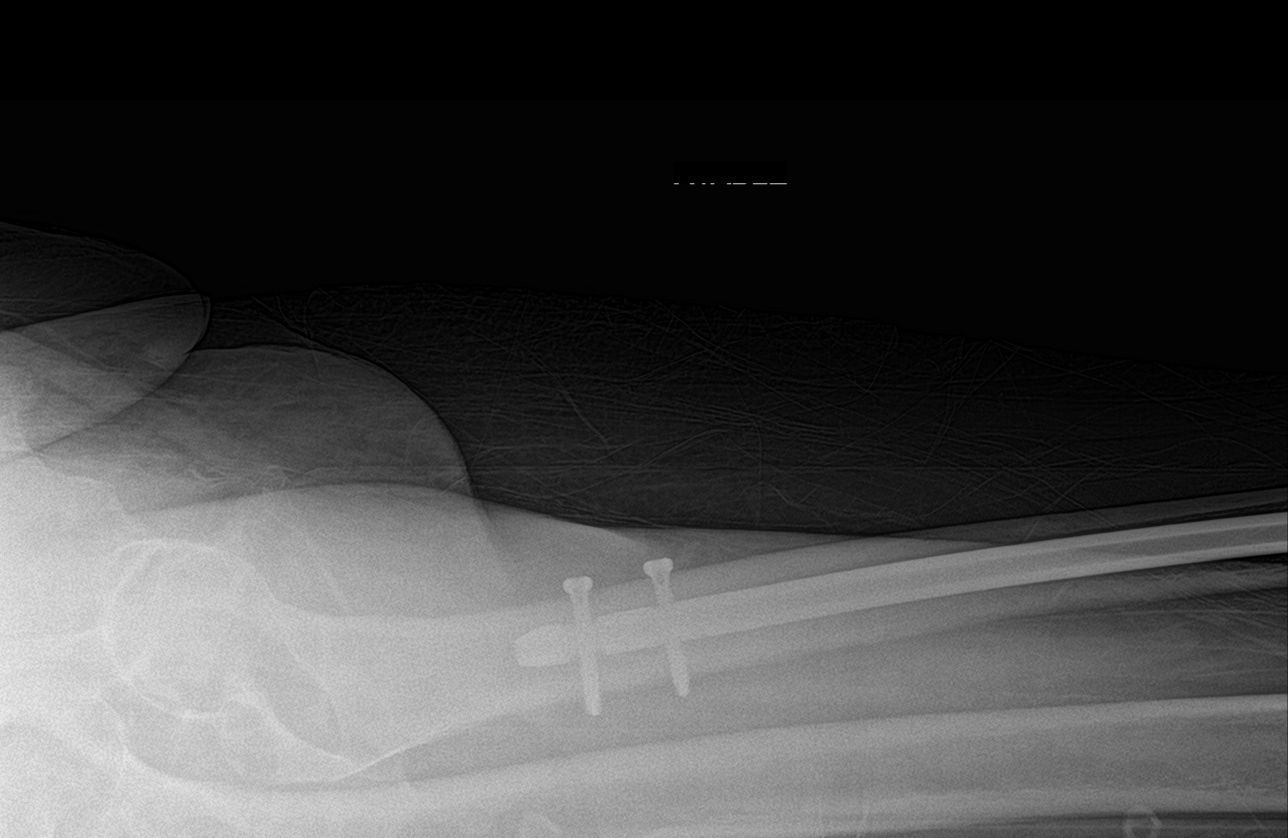

[4 of 4 positions shown; findings below may reference images not displayed]

FINDINGS: An intramedullary rod is seen in the left femur with interlocking
distal and proximal screws. The distal femoral fracture is again
seen. No acute fractures noted.
IMPRESSION: No acute interval fracture. The left intramedullary rod is in good
position. The distal femoral fracture is stable.

## 2018-11-02 MED ORDER — FERROUS SULFATE 325 (65 FE) MG PO TABS
325.0000 mg | ORAL_TABLET | Freq: Two times a day (BID) | ORAL | Status: DC
  Administered 2018-11-02 – 2018-11-04 (×5): 325 mg via ORAL
  Filled 2018-11-02 (×5): qty 1

## 2018-11-02 MED ORDER — SENNOSIDES-DOCUSATE SODIUM 8.6-50 MG PO TABS
2.0000 | ORAL_TABLET | Freq: Two times a day (BID) | ORAL | Status: DC
  Administered 2018-11-02 – 2018-11-04 (×5): 2 via ORAL
  Filled 2018-11-02 (×5): qty 2

## 2018-11-02 MED ORDER — INSULIN DETEMIR 100 UNIT/ML ~~LOC~~ SOLN
8.0000 [IU] | Freq: Every day | SUBCUTANEOUS | Status: DC
  Administered 2018-11-03: 21:00:00 8 [IU] via SUBCUTANEOUS
  Filled 2018-11-02 (×3): qty 0.08

## 2018-11-02 MED ORDER — POLYETHYLENE GLYCOL 3350 17 G PO PACK
17.0000 g | PACK | Freq: Two times a day (BID) | ORAL | Status: AC
  Administered 2018-11-02: 14:00:00 17 g via ORAL
  Filled 2018-11-02 (×2): qty 1

## 2018-11-02 MED ORDER — SODIUM CHLORIDE 0.9 % IV BOLUS
1000.0000 mL | Freq: Once | INTRAVENOUS | Status: AC
  Administered 2018-11-02: 18:00:00 1000 mL via INTRAVENOUS

## 2018-11-02 MED ORDER — SODIUM CHLORIDE 0.9 % IV SOLN
INTRAVENOUS | Status: DC
  Administered 2018-11-02 – 2018-11-03 (×2): via INTRAVENOUS

## 2018-11-02 NOTE — Progress Notes (Signed)
PROGRESS NOTE    Melanie Hall  JQZ:[id number] DOB: 09-13-1970 DOA: 10/28/2018 PCP: Patient, No Pcp Per     Brief Narrative: Melanie Hall is a 48 year old female withmedical history significant for type 2 diabetes, hypertension, and normocytic anemia who presented to the ED after suffering a mechanical fall when she slipped at home.   In the ED her initial vitals showed BP 173/97, pulse 102, RR 16, temp 97.7 Fahrenheit, SPO2 99% on room air. WBC 10.1, hemoglobin 10.8, MCV 88, platelets 235, sodium 134, potassium 3.7, serum glucose 345, BUN 17, creatinine 1.15. Left knee x-ray showed an oblique angulated supracondylar fracture of the distal femur. Orthopedics, Dr. Marlou Sa, was consulted and patient was taken to the OR for IM nailing and reduction of the condyles of supracondylar femur fracture.    Patient was seen in the PACU postoperatively, Postop, repeat hemoglobin was reported to decrease to7.8. Estimated operative blood loss was approximately 75 mL's per op note. Shewas given 1 unit transfusion of PRBC. She was also noted to be hyperglycemic and started on a continuous insulin infusion. The hospitalist service was consulted to assist with medical management of her anemia, diabetes, and hypertension   Assessment & Plan:   Principal Problem:   Closed fracture of left femur ([place]) Active Problems:   Uncontrolled diabetes mellitus with hyperglycemia (HCC)   Normocytic anemia   Hypertension associated with diabetes ([place])    Left distal femur fracture secondary to mechanical fall Orthopedics is the primary and patient underwent intramedullary nail placement. Nonweightbearing status to the left lower extremity as per orthopedist recommendations.   Type 2 diabetes mellitus   CBG (last 3)  Recent Labs    11/02/18 0838 11/02/18 0907 11/02/18 1207  GLUCAP 67* 72 96   A1c is 13 on admission poorly controlled probably secondary to noncompliance to medications currently on  Levemir 10 units daily, due to hypoglycemic event earlier this am, will decrease the dose of levemir to 8 units.  Case manager consult for medication assistance will be requested. Encourage oral intake .    Essential  hypertension Better controlled , lisinopril on hold for worsening renal para meters.  PRN IV hydralazine on board   Acute anemia of blood loss from the surgery. S/p 1 unit of PRBC transfusion Hemoglobin hovering between 8-9. Continue to monitor. Transfuse to keep hemoglobin greater than 7.    AKI probably from dehydration and prolonged n.p.o.Marland Kitchen poor oral intake.  Hydrated, recommend to encourage to oral intake.  Repeat bmp shows stable and improved creatinine.  Repeat creatinine pending today.    Lightheadedness and dizziness with some nausea on working with PT; Checked orthostatics, which were positive.  1 lit of NS bolus ordered and maintenance fluids for another 24 hours. Repeat orthostatics in am.  Get liver enzymes today to check for nausea,  She denies any abdominal pain.    DVT prophylaxis: Lovenox Code Status: Full code Family Communication: None at bedside  disposition Plan: Pending clinical improvement   Procedures: Surgical repair of the distal femur fracture Antimicrobials: None  Subjective: Reports some headache, lightheaded on working with PT, some dizziness  No chest pain, sob, .  Reports nausea with pain meds.  No vomiting or abdominal pain.  No tingling or numbness of the extremities.    Objective: Vitals:   11/01/18 0509 11/01/18 1943 11/02/18 0423 11/02/18 1313  BP: (!) 148/89 (!) 161/89 136/87 133/76  Pulse: 95 94 96 90  Resp:  18 18 15   Temp:  98.4 F (36.9 C) 98.1 F (36.7 C) 99 F (37.2 C) 99.1 F (37.3 C)  TempSrc: Oral Oral Oral Oral  SpO2: 98% 96% 96% 98%  Weight:      Height:        Intake/Output Summary (Last 24 hours) at 11/02/2018 1327 Last data filed at 11/01/2018 2208 Gross per 24 hour  Intake -  Output 400  ml  Net -400 ml   Filed Weights   10/28/18 1153 10/28/18 1524  Weight: 61.2 kg 61.2 kg    Examination:  General exam: Appears calm and comfortable  Respiratory system: Clear to auscultation. Respiratory effort normal. Cardiovascular system: S1 & S2 heard, RRR No pedal edema. Gastrointestinal system: Abdomen is nondistended, soft and nontender. Normal bowel sounds heard. Central nervous system: Alert and answering simple questions.  Extremities: no pedal edema.  Skin: No rashes, lesions or ulcers Psychiatry:  Mood & affect appropriate.     Data Reviewed: I have personally reviewed following labs and imaging studies  CBC: Recent Labs  Lab 10/28/18 1301  10/29/18 0830 10/30/18 0419 10/31/18 0149 11/01/18 1254 11/02/18 0308  WBC 10.1  --  9.9 8.5 8.9 7.3 6.5  NEUTROABS 7.5  --   --   --   --   --   --   HGB 10.8*   < > 9.0* 8.8* 7.9* 8.3* 7.4*  HCT 30.0*   < > 24.9* 24.8* 22.4* 24.0* 21.6*  MCV 88.0  --  88.9 90.2 91.4 92.3 92.7  PLT 235  --  171 160 139* 173 182   < > = values in this interval not displayed.   Basic Metabolic Panel: Recent Labs  Lab 10/28/18 1301 10/28/18 1733 10/29/18 0830 10/30/18 0419 10/31/18 0149 11/01/18 1254  NA 134* 137 136 137 135 136  K 3.7 3.8 3.7 3.4* 3.8 3.9  CL 104  --  105 108 106 105  CO2 23  --  22 20* 21* 21*  GLUCOSE 345* 319* 188* 110* 188* 96  BUN 17  --  19 18 21* 18  CREATININE 1.15*  --  1.29* 1.35* 1.39* 1.29*  CALCIUM 8.1*  --  8.0* 8.2* 8.0* 8.0*   GFR: Estimated Creatinine Clearance: 45.9 mL/min (A) (by C-G formula based on SCr of 1.29 mg/dL (H)). Liver Function Tests: No results for input(s): AST, ALT, ALKPHOS, BILITOT, PROT, ALBUMIN in the last 168 hours. No results for input(s): LIPASE, AMYLASE in the last 168 hours. No results for input(s): AMMONIA in the last 168 hours. Coagulation Profile: No results for input(s): INR, PROTIME in the last 168 hours. Cardiac Enzymes: No results for input(s): CKTOTAL,  CKMB, CKMBINDEX, TROPONINI in the last 168 hours. BNP (last 3 results) No results for input(s): PROBNP in the last 8760 hours. HbA1C: No results for input(s): HGBA1C in the last 72 hours. CBG: Recent Labs  Lab 11/01/18 1715 11/01/18 2033 11/02/18 0838 11/02/18 0907 11/02/18 1207  GLUCAP 79 85 67* 72 96   Lipid Profile: No results for input(s): CHOL, HDL, LDLCALC, TRIG, CHOLHDL, LDLDIRECT in the last 72 hours. Thyroid Function Tests: No results for input(s): TSH, T4TOTAL, FREET4, T3FREE, THYROIDAB in the last 72 hours. Anemia Panel: No results for input(s): VITAMINB12, FOLATE, FERRITIN, TIBC, IRON, RETICCTPCT in the last 72 hours. Sepsis Labs: No results for input(s): PROCALCITON, LATICACIDVEN in the last 168 hours.  No results found for this or any previous visit (from the past 240 hour(s)).       Radiology Studies: Dg Femur  Min 2 Views Left  Result Date: 11/02/2018 CLINICAL DATA:  Pain.  Fall. EXAM: LEFT FEMUR 2 VIEWS COMPARISON:  October 28, 2018 FINDINGS: An intramedullary rod is seen in the left femur with interlocking distal and proximal screws. The distal femoral fracture is again seen. No acute fractures noted. IMPRESSION: No acute interval fracture. The left intramedullary rod is in good position. The distal femoral fracture is stable. Electronically Signed   By: Dorise Bullion III M.D   On: 11/02/2018 08:31        Scheduled Meds: . aspirin EC  81 mg Oral BID  . enoxaparin (LOVENOX) injection  40 mg Subcutaneous Q24H  . ferrous sulfate  325 mg Oral BID WC  . insulin aspart  0-5 Units Subcutaneous QHS  . insulin aspart  0-9 Units Subcutaneous TID WC  . insulin detemir  10 Units Subcutaneous QHS  . polyethylene glycol  17 g Oral BID  . senna-docusate  2 tablet Oral BID   Continuous Infusions: . methocarbamol (ROBAXIN) IV       LOS: 5 days    Time spent: 25 minutes    Hosie Poisson, MD [facility] Pager [phone]  If 7PM-7AM, please contact  night-coverage [email or web address] Password [email or web address] 11/02/2018, 1:27 PM

## 2018-11-02 NOTE — Plan of Care (Signed)

## 2018-11-02 NOTE — Progress Notes (Signed)
Subjective: 5 Days Post-Op Procedure(s) (LRB): RETROGRADE FEMORAL NAILING (Left) Patient reports pain as moderate.  Patient notes that she has had increased pain since losing her balance and falling onto the left leg yesterday am.  She is also complaining of lightheadedness and nausea and does not feel like she can go home yet.   Objective: Vital signs in last 24 hours: Temp:  [98.1 F (36.7 C)-99 F (37.2 C)] 99 F (37.2 C) (04/26 0423) Pulse Rate:  [94-96] 96 (04/26 0423) Resp:  [18] 18 (04/26 0423) BP: (136-161)/(87-89) 136/87 (04/26 0423) SpO2:  [96 %] 96 % (04/26 0423)  Intake/Output from previous day: 04/25 0701 - 04/26 0700 In: -  Out: 1100 [Urine:1100] Intake/Output this shift: Total I/O In: -  Out: 400 [Urine:400]  Recent Labs    10/31/18 0149 11/01/18 1254 11/02/18 0308  HGB 7.9* 8.3* 7.4*   Recent Labs    11/01/18 1254 11/02/18 0308  WBC 7.3 6.5  RBC 2.60* 2.33*  HCT 24.0* 21.6*  PLT 173 182   Recent Labs    10/31/18 0149 11/01/18 1254  NA 135 136  K 3.8 3.9  CL 106 105  CO2 21* 21*  BUN 21* 18  CREATININE 1.39* 1.29*  GLUCOSE 188* 96  CALCIUM 8.0* 8.0*   No results for input(s): LABPT, INR in the last 72 hours.  Neurologically intact Neurovascular intact Sensation intact distally Intact pulses distally Dorsiflexion/Plantar flexion intact Incision: dressing C/D/I No cellulitis present Compartment soft   Assessment/Plan: 5 Days Post-Op Procedure(s) (LRB): RETROGRADE FEMORAL NAILING (Left) Advance diet Up with therapy Plan for discharge tomorrow  NWB LLE Will order xrays LLE Hemoglobin appears to be trending back down (7.4 this am).  Was previously transfused with 1 unit prbc.  Will continue to watch for now.  May need another transfusion if worsens or continues to be symptomatic.   Will continue medicine plan for medical management       Aundra Dubin 11/02/2018, 6:50 AM

## 2018-11-02 NOTE — Progress Notes (Signed)
Patient stated she is too dizzy to stand for orthostatic vitals. Will obtain lying and sitting VS.

## 2018-11-03 ENCOUNTER — Inpatient Hospital Stay (HOSPITAL_COMMUNITY): Payer: Self-pay

## 2018-11-03 LAB — CBC
HCT: 21.2 % — ABNORMAL LOW (ref 36.0–46.0)
Hemoglobin: 7.2 g/dL — ABNORMAL LOW (ref 12.0–15.0)
MCH: 31.6 pg (ref 26.0–34.0)
MCHC: 34 g/dL (ref 30.0–36.0)
MCV: 93 fL (ref 80.0–100.0)
Platelets: 188 10*3/uL (ref 150–400)
RBC: 2.28 MIL/uL — ABNORMAL LOW (ref 3.87–5.11)
RDW: 11.9 % (ref 11.5–15.5)
WBC: 6.1 10*3/uL (ref 4.0–10.5)
nRBC: 0 % (ref 0.0–0.2)

## 2018-11-03 LAB — IRON AND TIBC
Iron: 12 ug/dL — ABNORMAL LOW (ref 28–170)
Saturation Ratios: 7 % — ABNORMAL LOW (ref 10.4–31.8)
TIBC: 165 ug/dL — ABNORMAL LOW (ref 250–450)
UIBC: 153 ug/dL

## 2018-11-03 LAB — BASIC METABOLIC PANEL
Anion gap: 8 (ref 5–15)
BUN: 18 mg/dL (ref 6–20)
CO2: 22 mmol/L (ref 22–32)
Calcium: 8 mg/dL — ABNORMAL LOW (ref 8.9–10.3)
Chloride: 109 mmol/L (ref 98–111)
Creatinine, Ser: 1.56 mg/dL — ABNORMAL HIGH (ref 0.44–1.00)
GFR calc Af Amer: 45 mL/min — ABNORMAL LOW (ref >60)
GFR calc non Af Amer: 39 mL/min — ABNORMAL LOW (ref >60)
Glucose, Bld: 94 mg/dL (ref 70–99)
Potassium: 3.8 mmol/L (ref 3.5–5.1)
Sodium: 139 mmol/L (ref 135–145)

## 2018-11-03 LAB — LIPASE, BLOOD: Lipase: 19 U/L (ref 11–51)

## 2018-11-03 LAB — GLUCOSE, CAPILLARY
Glucose-Capillary: 93 mg/dL (ref 70–99)
Glucose-Capillary: 115 mg/dL — ABNORMAL HIGH (ref 70–99)
Glucose-Capillary: 96 mg/dL (ref 70–99)
Glucose-Capillary: 118 mg/dL — ABNORMAL HIGH (ref 70–99)

## 2018-11-03 LAB — PREPARE RBC (CROSSMATCH)

## 2018-11-03 IMAGING — CT CT ABDOMEN AND PELVIS WITHOUT CONTRAST
2 of 4 series · 16 of 46 positions shown, 18 images · non-contrast
Comparison: CT abdomen pelvis, [DATE]

CLINICAL DATA: Nausea, vomiting, recent nail fixation of the femur

EXAM:
CT ABDOMEN AND PELVIS WITHOUT CONTRAST
TECHNIQUE: Multidetector CT imaging of the abdomen and pelvis was performed
following the standard protocol without IV contrast.

[Series 3: a/p w/o 5mm · axial · non-contrast · 0.63mm/px · z∈[-552,-132]mm · 13 of 96 slices shown, 15 images]
[im 8/96  soft-tissue]
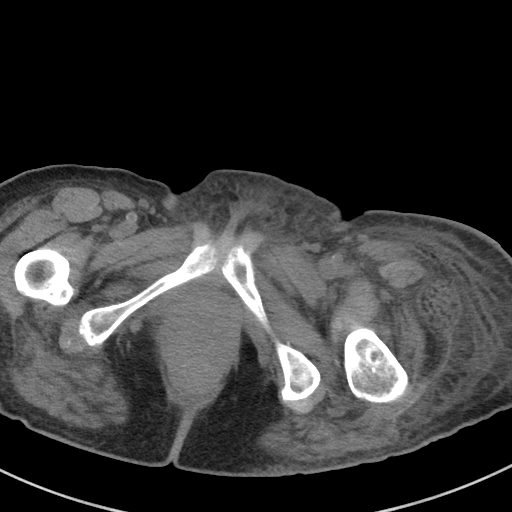
[im 8/96  bone]
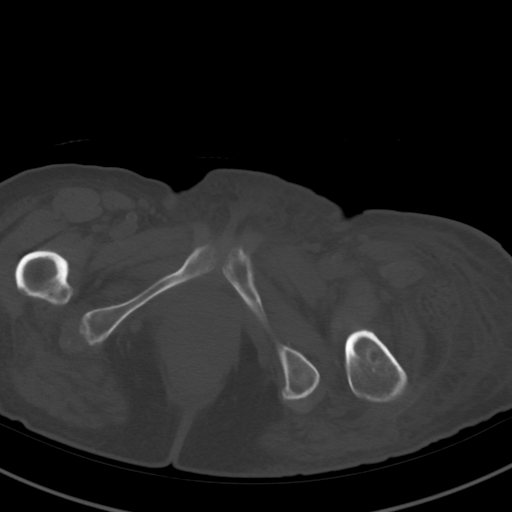
[im 15/96  soft-tissue]
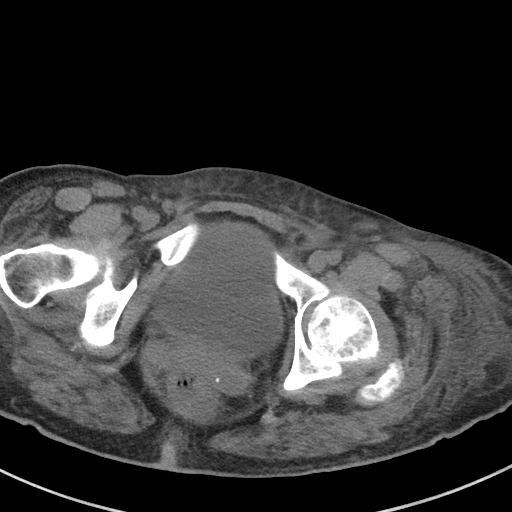
[im 22/96  soft-tissue]
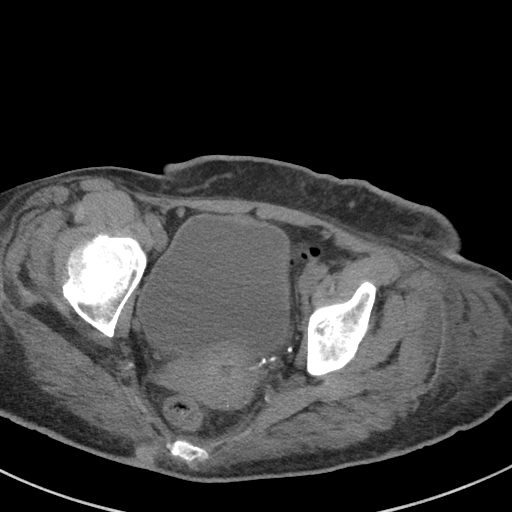
[im 29/96  soft-tissue]
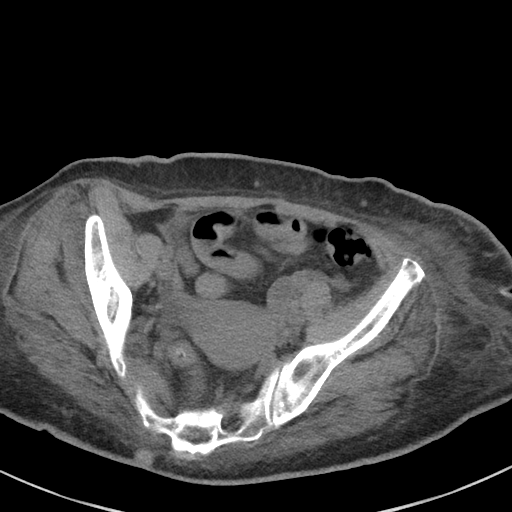
[im 36/96  soft-tissue]
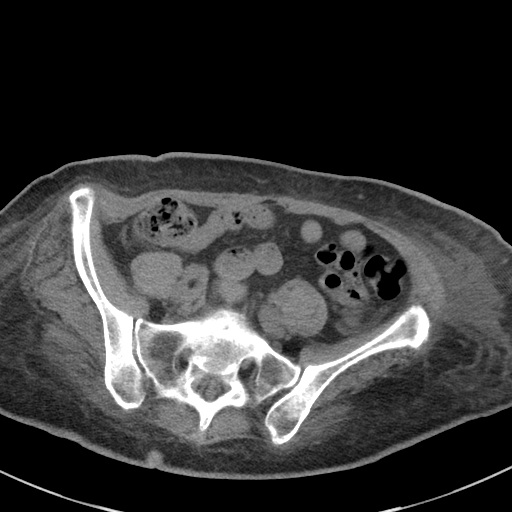
[im 43/96  soft-tissue]
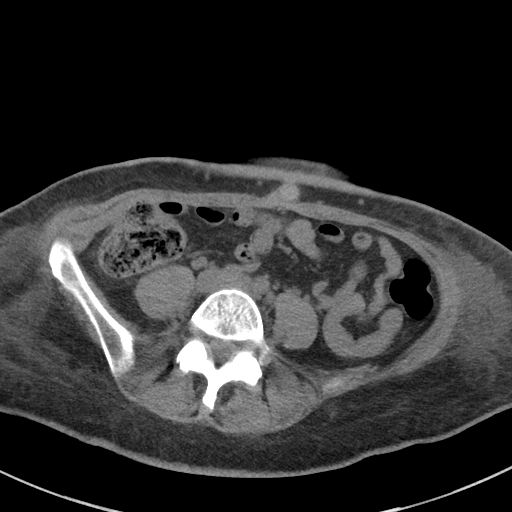
[im 50/96  soft-tissue]
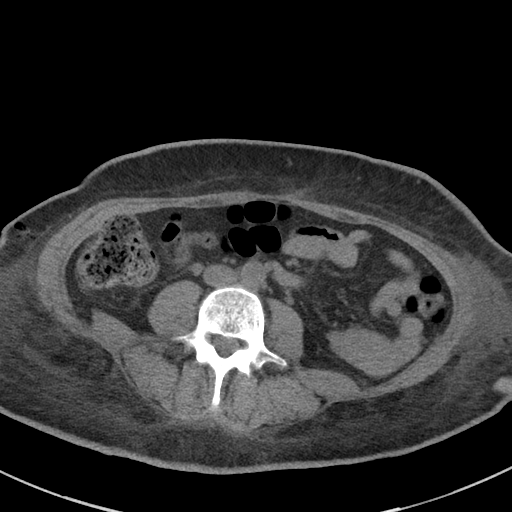
[im 57/96  soft-tissue]
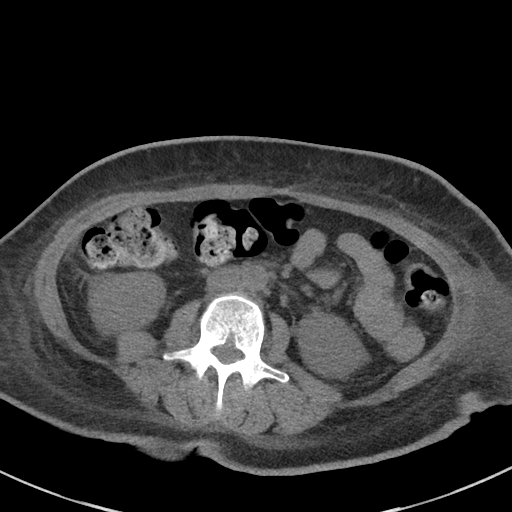
[im 64/96  soft-tissue]
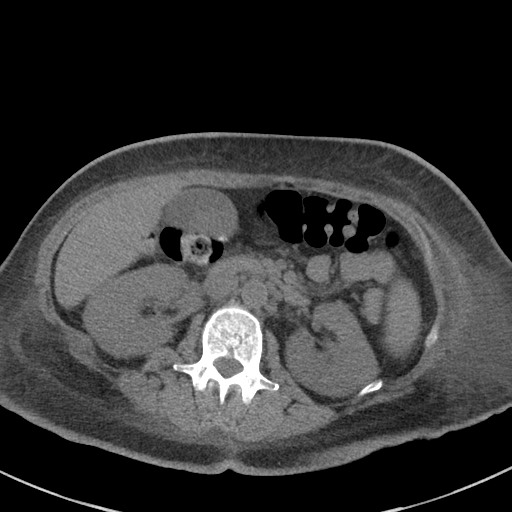
[im 64/96  bone]
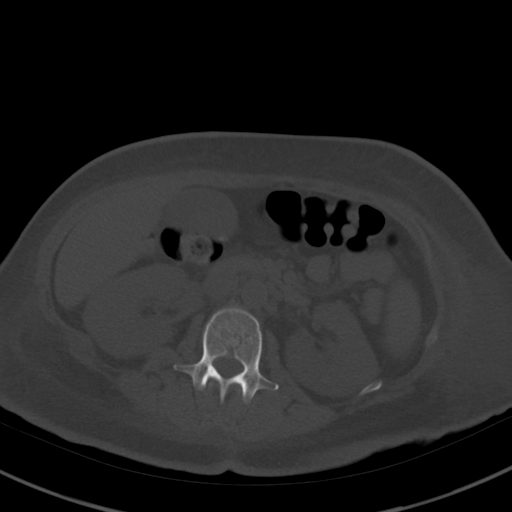
[im 71/96  soft-tissue]
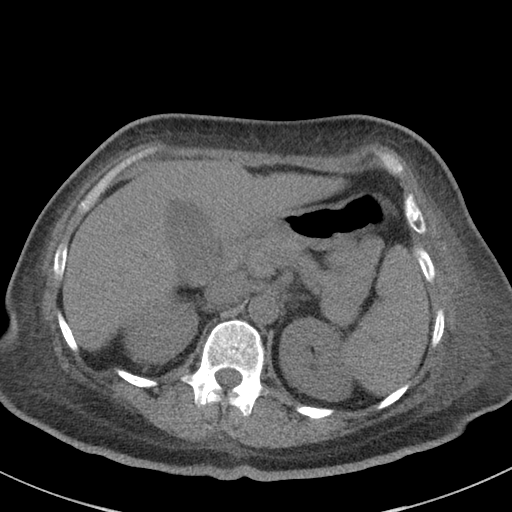
[im 78/96  soft-tissue]
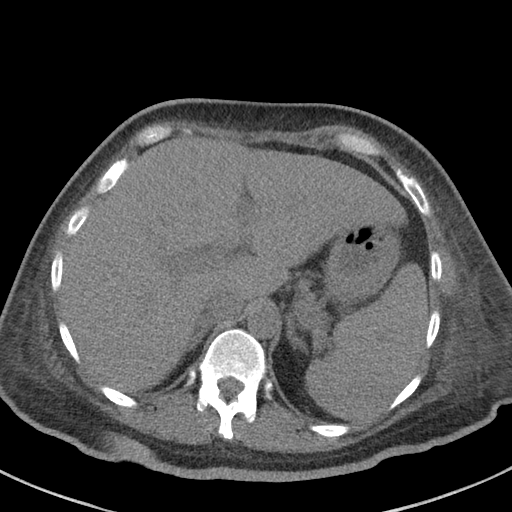
[im 85/96  soft-tissue]
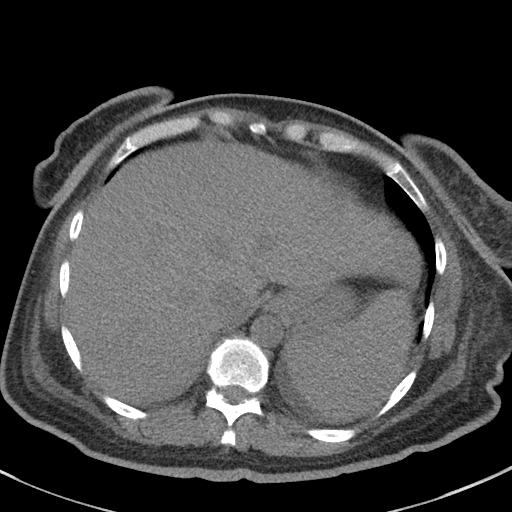
[im 92/96  soft-tissue]
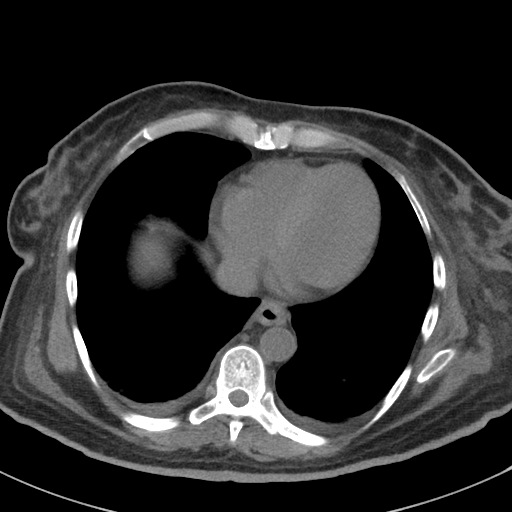

[Series 6: a/p w/o cor · coronal · non-contrast · 0.66mm/px · 3 of 150 slices shown]
[im 50/150  soft-tissue]
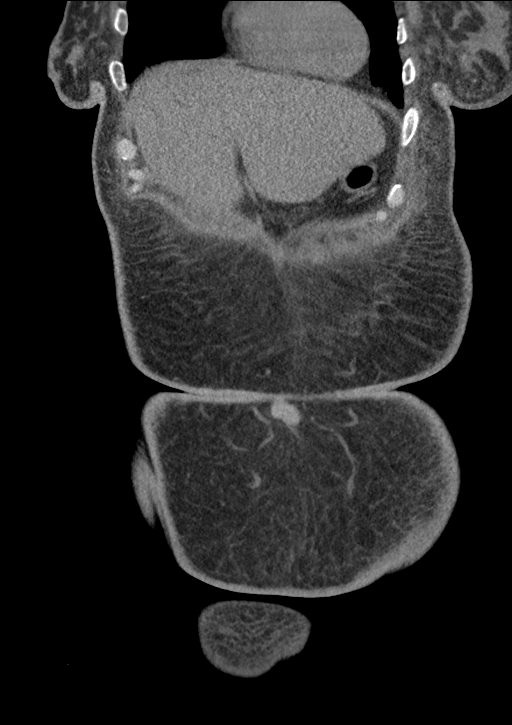
[im 67/150  soft-tissue]
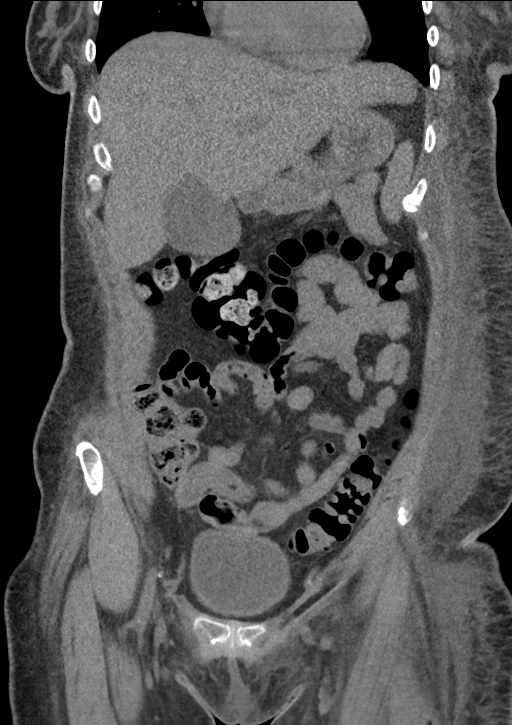
[im 83/150  soft-tissue]
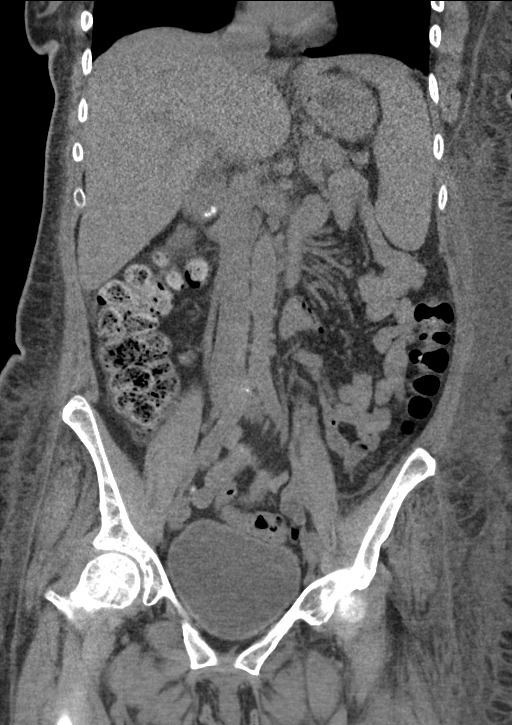

[16 of 46 positions shown; findings below may reference images not displayed]

FINDINGS: Lower chest: Small bilateral pleural effusions and associated
atelectasis or consolidation.

Hepatobiliary: No focal liver abnormality is seen. Small calcified
gallstones and/or sludge. No gallbladder wall thickening, or biliary
dilatation.

Pancreas: Unremarkable. No pancreatic ductal dilatation or
surrounding inflammatory changes.

Spleen: Normal in size without focal abnormality.

Adrenals/Urinary Tract: Adrenal glands are unremarkable. Kidneys are
normal, without renal calculi, focal lesion, or hydronephrosis.
Bladder is unremarkable.

Stomach/Bowel: Stomach is within normal limits. Appendix appears
normal. No evidence of bowel wall thickening, distention, or
inflammatory changes.

Vascular/Lymphatic: No significant vascular findings are present. No
enlarged abdominal or pelvic lymph nodes.

Reproductive: No mass or other abnormality.

Other: No abdominal wall hernia or abnormality. Anasarca. Trace
abdominal and pelvic ascites.

Musculoskeletal: No acute or significant osseous findings.
IMPRESSION: 1. No acute noncontrast CT findings of the abdomen or pelvis to
explain nausea or vomiting. No evidence of bowel obstruction.

2.  Gallstones.  No CT evidence of acute cholecystitis.

3.  Pleural effusions, trace ascites, and anasarca.

## 2018-11-03 MED ORDER — GLUCERNA SHAKE PO LIQD: 237.0000 mL | Freq: Three times a day (TID) | ORAL | Status: DC

## 2018-11-03 MED ORDER — SODIUM CHLORIDE 0.9 % IV BOLUS
1000.0000 mL | Freq: Once | INTRAVENOUS | Status: AC
  Administered 2018-11-03: 13:00:00 1000 mL via INTRAVENOUS

## 2018-11-03 MED ORDER — BISACODYL 10 MG RE SUPP
10.0000 mg | Freq: Every day | RECTAL | Status: DC | PRN
  Administered 2018-11-03: 10:00:00 10 mg via RECTAL
  Filled 2018-11-03: qty 1

## 2018-11-03 MED ORDER — INSULIN DETEMIR 100 UNIT/ML ~~LOC~~ SOLN: 8.0000 [IU] | Freq: Every day | SUBCUTANEOUS | 11 refills | Status: DC

## 2018-11-03 MED ORDER — METHOCARBAMOL 500 MG PO TABS: 500.0000 mg | ORAL_TABLET | Freq: Four times a day (QID) | ORAL | 0 refills | Status: DC | PRN

## 2018-11-03 MED ORDER — ADULT MULTIVITAMIN W/MINERALS CH
1.0000 | ORAL_TABLET | Freq: Every day | ORAL | Status: DC
  Administered 2018-11-03 – 2018-11-04 (×2): 1 via ORAL
  Filled 2018-11-03 (×2): qty 1

## 2018-11-03 MED ORDER — OXYCODONE HCL 5 MG PO TABS: 5.0000 mg | ORAL_TABLET | ORAL | 0 refills | Status: DC | PRN

## 2018-11-03 MED ORDER — SODIUM CHLORIDE 0.9% IV SOLUTION: Freq: Once | INTRAVENOUS | Status: DC

## 2018-11-03 MED ORDER — OXYCODONE HCL 5 MG PO TABS
5.0000 mg | ORAL_TABLET | ORAL | Status: DC | PRN
  Administered 2018-11-03: 5 mg via ORAL
  Filled 2018-11-03: qty 1

## 2018-11-03 MED ORDER — ASPIRIN 81 MG PO TBEC: 81.0000 mg | DELAYED_RELEASE_TABLET | Freq: Two times a day (BID) | ORAL | 0 refills | Status: DC

## 2018-11-03 NOTE — Progress Notes (Signed)
Initial Nutrition Assessment  RD working remotely.  DOCUMENTATION CODES:   Not applicable  INTERVENTION:   -Glucerna Shake po TID, each supplement provides 220 kcal and 10 grams of protein -MVI with minerals daily -RD provided information regarding free community diabetes classes in [place] due to uncontroledl DM; pt has been connected with [facility] on the past  NUTRITION DIAGNOSIS:   Inadequate oral intake related to nausea, decreased appetite as evidenced by meal completion < 50%.  GOAL:   Patient will meet greater than or equal to 90% of their needs  MONITOR:   PO intake, Supplement acceptance, Weight trends, Skin, I & O's  REASON FOR ASSESSMENT:   Consult Assessment of nutrition requirement/status  ASSESSMENT:   Melanie Hall was at home this morning putting on her jacket when she lost her balance and fell. Her right foot went out in front and her left leg crumpled beneath her. She had immediate pain and could not get up. She was taken to the ED at [facility] where x-rays showed a distal femur fx. She was transferred to [facility] for further care and orthopedic surgery was consulted  Pt admitted with lt femur fx.   4/21- s/p retrograde femoral nailing  Attempted to speak with pt, however, pt did not answer phone when RD called. Unable to obtain further nutrition-related history at this time.   Per MD notes, pt has been experiencing nausea and having poor oral intake. Noted meal completion 15-50%.  Per wt hx, wt has been stable over the past year.   Plan to discharge tomorrow (11/04/18) with home health services and family assistance.   Lab Results  Component Value Date   HGBA1C 13.3 (H) 08/27/2018   PTA DM medications are 15 units insulin detemir q HS and 550 mg metformin BID. Pt has been connected to [facility] in the past. Noted pt with history of missed appointments secondary to family commitments  and transportation challenges.   Labs reviewed: CBGS: 115 (inpatient orders for glycemic control are 0-5 units insulin aspart q HS, 0-9 units insulin aspart TID with meals, and 8 units insulin detemir q HS).   NUTRITION - FOCUSED PHYSICAL EXAM:    Most Recent Value  Orbital Region  Unable to assess  Upper Arm Region  Unable to assess  Thoracic and Lumbar Region  Unable to assess  Buccal Region  Unable to assess  Temple Region  Unable to assess  Clavicle Bone Region  Unable to assess  Clavicle and Acromion Bone Region  Unable to assess  Scapular Bone Region  Unable to assess  Dorsal Hand  Unable to assess  Patellar Region  Unable to assess  Anterior Thigh Region  Unable to assess  Posterior Calf Region  Unable to assess  Edema (RD Assessment)  Unable to assess  Hair  Unable to assess  Eyes  Unable to assess  Mouth  Unable to assess  Skin  Unable to assess  Nails  Unable to assess       Diet Order:   Diet Order            Diet - low sodium heart healthy        Diet Carb Modified Fluid consistency: Thin; Room service appropriate? Yes  Diet effective now              EDUCATION NEEDS:   No education needs have been identified at this time  Skin:  Skin Assessment: Skin Integrity Issues: Skin Integrity Issues:: Incisions Incisions: closed lt leg  Last BM:  10/29/18  Height:   Ht Readings from Last 1 Encounters:  10/28/18 5\' 2"  (1.575 m)    Weight:   Wt Readings from Last 1 Encounters:  10/28/18 61.2 kg    Ideal Body Weight:  50 kg  BMI:  Body mass index is 24.69 kg/m.  Estimated Nutritional Needs:   Kcal:  1650-1850  Protein:  80-95 grams  Fluid:  1.6-1.8 L    Lovel Suazo A. Jimmye Norman, RD, LDN, [place] Registered Dietitian II Certified Diabetes Care and Education Specialist Pager: [phone] After hours Pager: [phone]

## 2018-11-03 NOTE — Progress Notes (Signed)
Blood bank called and stated that at the present time they are not releasing blood transfusions unless Hgb is <7.  Pt's Hgb this am was 7.2.  MD notified.  Melanie Hall

## 2018-11-03 NOTE — Progress Notes (Signed)
Physical Therapy Treatment Patient Details Name: Melanie Hall MRN: [id number] DOB: 03/01/1971 Today's Date: 11/03/2018    History of Present Illness Pt is a 48 y.o. female admitted 10/28/18 after fall sustaining L distal femur fx; now s/p L femoral IM nail 4/21. PMH includes HTN, DM, anemia, cataracts.     PT Comments    Pt tolerated ambulation in room well, but is still struggling to be completely NWB on LLE. When PT provides verbal cuing about this, pt does not appear to apply feedback. Pt limited by LLE pain and potentially irritability with PT. Pt refused LE exercises this session, requesting to rest after ambulation in room. PT to continue to follow acutely.    Follow Up Recommendations  Home health PT;Supervision for mobility/OOB     Equipment Recommendations  Rolling walker with 5" wheels;3in1 (PT)    Recommendations for Other Services       Precautions / Restrictions Precautions Precautions: Fall Restrictions Weight Bearing Restrictions: Yes LLE Weight Bearing: Non weight bearing    Mobility  Bed Mobility Overal bed mobility: Needs Assistance Bed Mobility: Supine to Sit;Sit to Supine     Supine to sit: Supervision;HOB elevated Sit to supine: Min assist   General bed mobility comments: Supervision for supine to sit, increased time and use of bed rails. min assist for sit to supine for LLE lifting and placement in bed, increased time and effort.  Transfers Overall transfer level: Needs assistance Equipment used: Rolling walker (2 wheeled) Transfers: Sit to/from Stand Sit to Stand: Min assist         General transfer comment: Min assist for power up, steadying. PT reinforcing NWB status of LLE, pt obviously resting toes on the ground when standing. Sit to stand x2, once from bed and once from [facility].  Ambulation/Gait Ambulation/Gait assistance: Min guard Gait Distance (Feet): 15 Feet Assistive device: Rolling walker (2 wheeled) Gait Pattern/deviations:  Step-to pattern;Decreased weight shift to left;Antalgic Gait velocity: decr    General Gait Details: Min guard for safety, frequent verbal cuing for maintaining NWB LLE as pt with tendency to perform TDWB, placement in RW.    Stairs             Wheelchair Mobility    Modified Rankin (Stroke Patients Only)       Balance Overall balance assessment: Needs assistance Sitting-balance support: No upper extremity supported;Feet supported Sitting balance-Leahy Scale: Fair Sitting balance - Comments: able to sit EOB without PT assist     Standing balance-Leahy Scale: Poor Standing balance comment: Reliant on UE support                            Cognition Arousal/Alertness: Awake/alert Behavior During Therapy: Flat affect Overall Cognitive Status: Within Functional Limits for tasks assessed                                 General Comments: WFL for simple tasks. Pt seemed irritated with PT, raising her voice with PT intermittently for no apparent reason and pt became tearful at end of session stating "Would you get me an ice water? They said they would but they didn't"      Exercises      General Comments        Pertinent Vitals/Pain Pain Assessment: 0-10 Pain Score: 10-Worst pain ever Pain Location: LLE Pain Descriptors / Indicators: Grimacing;Guarding;Operative site guarding Pain Intervention(s):  Limited activity within patient's tolerance;Monitored during session;Repositioned    Home Living                      Prior Function            PT Goals (current goals can now be found in the care plan section) Acute Rehab PT Goals Patient Stated Goal: Return home with help from daughters PT Goal Formulation: With patient Time For Goal Achievement: 11/12/18 Potential to Achieve Goals: Good    Frequency    Min 5X/week      PT Plan Current plan remains appropriate    Co-evaluation              AM-PAC PT "6 Clicks"  Mobility   Outcome Measure  Help needed turning from your back to your side while in a flat bed without using bedrails?: A Little Help needed moving from lying on your back to sitting on the side of a flat bed without using bedrails?: None Help needed moving to and from a bed to a chair (including a wheelchair)?: A Little Help needed standing up from a chair using your arms (e.g., wheelchair or bedside chair)?: A Little Help needed to walk in hospital room?: A Little Help needed climbing 3-5 steps with a railing? : A Lot 6 Click Score: 18    End of Session Equipment Utilized During Treatment: Gait belt Activity Tolerance: Patient limited by pain Patient left: in bed;with call bell/phone within reach;with bed alarm set Nurse Communication: Mobility status PT Visit Diagnosis: Other abnormalities of gait and mobility (R26.89);Pain Pain - Right/Left: Left Pain - part of body: Leg     Time: 3112-1624 PT Time Calculation (min) (ACUTE ONLY): 20 min  Charges:  $Gait Training: 8-22 mins                     Julien Girt, PT Acute Rehabilitation Services Pager [phone]  Office [phone]    Reeve Turnley D Ossie Beltran 11/03/2018, 1:07 PM

## 2018-11-03 NOTE — Progress Notes (Signed)
OT Cancellation Note  Patient Details Name: ALANEE TING MRN: [id number] DOB: 21-Jan-1971   Cancelled Treatment:    Reason Eval/Treat Not Completed: Other (comment)(pt reporting nausea). Pt declining even sitting EOB to perform ADLs, reporting she feels too nauseous.  Per chart review, hemoglobin has been trending down.  Will re-attempt later this afternoon as time allows.    Darrol Jump OTR/L [place] [phone] 11/03/2018, 1:07 PM

## 2018-11-03 NOTE — Progress Notes (Signed)
Subjective: Pt stable - xray ok from yesterday   Objective: Vital signs in last 24 hours: Temp:  [98.1 F (36.7 C)-99.1 F (37.3 C)] 98.2 F (36.8 C) (04/27 0907) Pulse Rate:  [87-107] 89 (04/27 0911) Resp:  [15-16] 16 (04/27 0907) BP: (113-152)/(74-99) 113/99 (04/27 0911) SpO2:  [95 %-98 %] 95 % (04/27 0907)  Intake/Output from previous day: 04/26 0701 - 04/27 0700 In: 927.2 [P.O.:120; I.V.:807.2] Out: 1050 [Urine:1050] Intake/Output this shift: Total I/O In: -  Out: 300 [Urine:300]  Exam:  Dorsiflexion/Plantar flexion intact  Labs: Recent Labs    11/01/18 1254 11/02/18 0308 11/03/18 0246  HGB 8.3* 7.4* 7.2*   Recent Labs    11/02/18 0308 11/03/18 0246  WBC 6.5 6.1  RBC 2.33* 2.28*  HCT 21.6* 21.2*  PLT 182 188   Recent Labs    11/02/18 0308 11/03/18 0246  NA 136 139  K 3.7 3.8  CL 106 109  CO2 23 22  BUN 19 18  CREATININE 1.26* 1.56*  GLUCOSE 75 94  CALCIUM 8.1* 8.0*   No results for input(s): LABPT, INR in the last 72 hours.  Assessment/Plan: Plan dc to home tomorrow with hhpt   Anderson Malta 11/03/2018, 11:32 AM

## 2018-11-03 NOTE — Plan of Care (Signed)
  Problem: Education: Goal: Knowledge of General Education information will improve Description Including pain rating scale, medication(s)/side effects and non-pharmacologic comfort measures Outcome: Progressing   Problem: Health Behavior/Discharge Planning: Goal: Ability to manage health-related needs will improve Outcome: Progressing   Problem: Clinical Measurements: Goal: Ability to maintain clinical measurements within normal limits will improve Outcome: Progressing Goal: Will remain free from infection Outcome: Progressing   Problem: Activity: Goal: Risk for activity intolerance will decrease Outcome: Progressing   Problem: Nutrition: Goal: Adequate nutrition will be maintained Outcome: Progressing   Problem: Coping: Goal: Level of anxiety will decrease Outcome: Progressing   Problem: Elimination: Goal: Will not experience complications related to urinary retention Outcome: Progressing   Problem: Pain Managment: Goal: General experience of comfort will improve Outcome: Progressing   Problem: Safety: Goal: Ability to remain free from injury will improve Outcome: Progressing   Problem: Skin Integrity: Goal: Risk for impaired skin integrity will decrease Outcome: Progressing   

## 2018-11-03 NOTE — Plan of Care (Signed)
  Problem: Education: Goal: Knowledge of General Education information will improve Description Including pain rating scale, medication(s)/side effects and non-pharmacologic comfort measures Outcome: Progressing   Problem: Health Behavior/Discharge Planning: Goal: Ability to manage health-related needs will improve Outcome: Progressing   Problem: Clinical Measurements: Goal: Ability to maintain clinical measurements within normal limits will improve Outcome: Progressing Goal: Will remain free from infection Outcome: Progressing Goal: Diagnostic test results will improve Outcome: Progressing   Problem: Activity: Goal: Risk for activity intolerance will decrease Outcome: Progressing   Problem: Coping: Goal: Level of anxiety will decrease Outcome: Progressing   Problem: Elimination: Goal: Will not experience complications related to urinary retention Outcome: Progressing   Problem: Pain Managment: Goal: General experience of comfort will improve Outcome: Progressing   Problem: Safety: Goal: Ability to remain free from injury will improve Outcome: Progressing   Problem: Skin Integrity: Goal: Risk for impaired skin integrity will decrease Outcome: Progressing

## 2018-11-03 NOTE — Progress Notes (Signed)
PROGRESS NOTE    Melanie Hall  XKP:[id number] DOB: December 31, 1970 DOA: 10/28/2018 PCP: Patient, No Pcp Per     Brief Narrative: Melanie Hall is a 48 year old female withmedical history significant for type 2 diabetes, hypertension, and normocytic anemia who presented to the ED after suffering a mechanical fall when she slipped at home.   In the ED her initial vitals showed BP 173/97, pulse 102, RR 16, temp 97.7 Fahrenheit, SPO2 99% on room air. WBC 10.1, hemoglobin 10.8, MCV 88, platelets 235, sodium 134, potassium 3.7, serum glucose 345, BUN 17, creatinine 1.15. Left knee x-ray showed an oblique angulated supracondylar fracture of the distal femur. Orthopedics, Dr. Marlou Sa, was consulted and patient was taken to the OR for IM nailing and reduction of the condyles of supracondylar femur fracture.    Patient was seen in the PACU postoperatively, Postop, repeat hemoglobin was reported to decrease to7.8. Estimated operative blood loss was approximately 75 mL's per op note. Shewas given 1 unit transfusion of PRBC. She was also noted to be hyperglycemic and started on a continuous insulin infusion. The hospitalist service was consulted to assist with medical management of her anemia, diabetes, and hypertension   Assessment & Plan:   Principal Problem:   Closed fracture of left femur ([place]) Active Problems:   Uncontrolled diabetes mellitus with hyperglycemia ([facility])   Normocytic anemia   Hypertension associated with diabetes ([place])    Left distal femur fracture secondary to mechanical fall Orthopedics is the primary and patient underwent intramedullary nail placement. Nonweightbearing status to the left lower extremity as per orthopedist recommendations.   Type 2 diabetes mellitus   CBG (last 3)  Recent Labs    11/02/18 2123 11/03/18 0729 11/03/18 1156  GLUCAP 82 93 115*   A1c is 13 on admission poorly controlled probably secondary to noncompliance to medications . currently on  Levemir 10 units daily, due to hypoglycemic events on 4/26,  decreased the dose of levemir to 8 units.  Case manager consult for medication assistance will be requested. Encourage oral intake .    Essential  hypertension Better controlled , lisinopril on hold for worsening renal para meters.  PRN IV hydralazine on board   Acute anemia of blood loss from the surgery. S/p 1 unit of PRBC transfusion. Hemoglobin around 7.2. Low iron levels, iron supplementation added.  Stool for occult blood ordered.    AKI probably from dehydration and prolonged n.p.o.Marland Kitchen poor oral intake.  Hydrated, recommend to encourage to oral intake.  UA, and urine electrolytes ordered.  Urine output adequate  Lightheadedness and dizziness with some nausea on working with PT; Secondary to orthostatic hypotension and tachycardia.  Repeat orthostatics in am.    Anasarca due to protein cal malnutrition:  Dietary consult   Nausea, one episode of vomiting last night, no abdominal pain No BM since admission. Stool softeners, miralax and suppository on board.  CT abd and pelvis without contrast negative for obstruction or acute pathology.     DVT prophylaxis: Lovenox Code Status: Full code Family Communication: None at bedside  disposition Plan: Pending clinical improvement   Procedures: Surgical repair of the distal femur fracture Antimicrobials: None  Subjective: No headache, nauseated, and one episode of vomiting yesterday.  No chest pain or sob. Tired.    Objective: Vitals:   11/03/18 0533 11/03/18 0907 11/03/18 0910 11/03/18 0911  BP: (!) 149/80 (!) 150/83 (!) 142/78 (!) 113/99  Pulse: 92 98 (!) 102 89  Resp:  16  Temp: 98.6 F (37 C) 98.2 F (36.8 C)    TempSrc: Oral Oral    SpO2: 96% 95%    Weight:      Height:        Intake/Output Summary (Last 24 hours) at 11/03/2018 1247 Last data filed at 11/03/2018 1100 Gross per 24 hour  Intake 807.19 ml  Output 1650 ml  Net -842.81 ml    Filed Weights   10/28/18 1153 10/28/18 1524  Weight: 61.2 kg 61.2 kg    Examination:  General exam: Appears calm and comfortable , no distress.  Respiratory system: Clear to auscultation. Respiratory effort normal. Cardiovascular system: S1 & S2 heard, RRR No pedal edema. Gastrointestinal system: Abdomen is nondistended, soft and nontender. Normal bowel sounds heard. Central nervous system: Alert and answering simple questions.  Extremities: no pedal edema.  Skin: No rashes, lesions or ulcers Psychiatry:  Mood & affect flat.     Data Reviewed: I have personally reviewed following labs and imaging studies  CBC: Recent Labs  Lab 10/28/18 1301  10/30/18 0419 10/31/18 0149 11/01/18 1254 11/02/18 0308 11/03/18 0246  WBC 10.1   < > 8.5 8.9 7.3 6.5 6.1  NEUTROABS 7.5  --   --   --   --   --   --   HGB 10.8*   < > 8.8* 7.9* 8.3* 7.4* 7.2*  HCT 30.0*   < > 24.8* 22.4* 24.0* 21.6* 21.2*  MCV 88.0   < > 90.2 91.4 92.3 92.7 93.0  PLT 235   < > 160 139* 173 182 188   < > = values in this interval not displayed.   Basic Metabolic Panel: Recent Labs  Lab 10/30/18 0419 10/31/18 0149 11/01/18 1254 11/02/18 0308 11/03/18 0246  NA 137 135 136 136 139  K 3.4* 3.8 3.9 3.7 3.8  CL 108 106 105 106 109  CO2 20* 21* 21* 23 22  GLUCOSE 110* 188* 96 75 94  BUN 18 21* 18 19 18   CREATININE 1.35* 1.39* 1.29* 1.26* 1.56*  CALCIUM 8.2* 8.0* 8.0* 8.1* 8.0*   GFR: Estimated Creatinine Clearance: 37.9 mL/min (A) (by C-G formula based on SCr of 1.56 mg/dL (H)). Liver Function Tests: Recent Labs  Lab 11/02/18 0308  AST 12*  ALT 8  ALKPHOS 68  BILITOT 0.9  PROT 4.6*  ALBUMIN 1.5*   No results for input(s): LIPASE, AMYLASE in the last 168 hours. No results for input(s): AMMONIA in the last 168 hours. Coagulation Profile: No results for input(s): INR, PROTIME in the last 168 hours. Cardiac Enzymes: No results for input(s): CKTOTAL, CKMB, CKMBINDEX, TROPONINI in the last 168  hours. BNP (last 3 results) No results for input(s): PROBNP in the last 8760 hours. HbA1C: No results for input(s): HGBA1C in the last 72 hours. CBG: Recent Labs  Lab 11/02/18 1207 11/02/18 1709 11/02/18 2123 11/03/18 0729 11/03/18 1156  GLUCAP 96 72 82 93 115*   Lipid Profile: No results for input(s): CHOL, HDL, LDLCALC, TRIG, CHOLHDL, LDLDIRECT in the last 72 hours. Thyroid Function Tests: No results for input(s): TSH, T4TOTAL, FREET4, T3FREE, THYROIDAB in the last 72 hours. Anemia Panel: Recent Labs    11/03/18 0246  TIBC 165*  IRON 12*   Sepsis Labs: No results for input(s): PROCALCITON, LATICACIDVEN in the last 168 hours.  No results found for this or any previous visit (from the past 240 hour(s)).       Radiology Studies: Dg Femur Min 2 Views Left  Result Date:  11/02/2018 CLINICAL DATA:  Pain.  Fall. EXAM: LEFT FEMUR 2 VIEWS COMPARISON:  October 28, 2018 FINDINGS: An intramedullary rod is seen in the left femur with interlocking distal and proximal screws. The distal femoral fracture is again seen. No acute fractures noted. IMPRESSION: No acute interval fracture. The left intramedullary rod is in good position. The distal femoral fracture is stable. Electronically Signed   By: Dorise Bullion III M.D   On: 11/02/2018 08:31        Scheduled Meds:  sodium chloride   Intravenous Once   aspirin EC  81 mg Oral BID   enoxaparin (LOVENOX) injection  40 mg Subcutaneous Q24H   ferrous sulfate  325 mg Oral BID WC   insulin aspart  0-5 Units Subcutaneous QHS   insulin aspart  0-9 Units Subcutaneous TID WC   insulin detemir  8 Units Subcutaneous QHS   senna-docusate  2 tablet Oral BID   Continuous Infusions:  methocarbamol (ROBAXIN) IV     sodium chloride       LOS: 6 days    Time spent: 25 minutes    Hosie Poisson, MD [facility] Pager [phone]  If 7PM-7AM, please contact night-coverage [email or web address] Password [facility] 11/03/2018, 12:47  PM

## 2018-11-03 NOTE — Discharge Instructions (Signed)
Free Group Diabetes Education Class  IMPORTANT NOTICE: In keeping with CDC recommendations, and out of an abundance of caution, wherever possible  will be postponing or cancelling group gatherings until Nov 17, 2018. In light of this policy, the Group Diabetes Classes on April 6, April 20 and May 4 are cancelled. [facility] offers free diabetes classes to the public. These classes are targeted to community members who are at risk for diabetes, newly diagnosed with diabetes, or want to learn more about diabetes. Each class will cover diabetes management basics (survival skills) and meal planning.  Classes meet twice each month: First Monday of each month, 9-11 a.m.  Third Monday of each month, 5:30-7:30 p.m.  Location Details [facility] [address] [place], [place] [address]  The same material is presented at each class. You only need to attend one session. No referral is necessary; however, registration is required. Call [phone] to register.

## 2018-11-03 NOTE — TOC Transition Note (Signed)
Transition of Care [facility]) - CM/SW Discharge Note   Patient Details  Name: Melanie Hall MRN: [id number] Date of Birth: [birth year]  Transition of Care [facility]) CM/SW Contact:  Marilu Favre, RN Phone Number: 11/03/2018, 4:20 PM   Clinical Narrative:     Patient not eligible for MATCH ( medication assistance)  Just used Feb of this year. Patient has [facility]. Last admission document patient was set up with [facility] [place] information.   Home health arranged with [facility] . Walker and 3 in 1 was delivered to room. Final next level of care: [place] Barriers to Discharge: Continued Medical Work up   Patient Goals and CMS Choice Patient states their goals for this hospitalization and ongoing recovery are:: to go home  CMS Medicare.gov Compare Post Acute Care list provided to:: Patient Choice offered to / list presented to : Patient  Discharge Placement                       Discharge Plan and Services   Discharge Planning Services: CM Consult            DME Arranged: 3-N-1, Walker rolling DME Agency: [employer] Date DME Agency Contacted: 10/31/18 Time DME Agency Contacted: 1034 Representative spoke with at DME Agency: [place]: PT [place]: [facility] (formerly [employer]) Date [place]: 10/31/18 Time [place]: [place] ([place]) Interventions     Readmission Risk Interventions No flowsheet data found.

## 2018-11-04 ENCOUNTER — Telehealth (INDEPENDENT_AMBULATORY_CARE_PROVIDER_SITE_OTHER): Payer: Self-pay

## 2018-11-04 LAB — CBC
HCT: 21.3 % — ABNORMAL LOW (ref 36.0–46.0)
Hemoglobin: 7.4 g/dL — ABNORMAL LOW (ref 12.0–15.0)
MCH: 31.8 pg (ref 26.0–34.0)
MCHC: 34.7 g/dL (ref 30.0–36.0)
MCV: 91.4 fL (ref 80.0–100.0)
Platelets: 242 10*3/uL (ref 150–400)
RBC: 2.33 MIL/uL — ABNORMAL LOW (ref 3.87–5.11)
RDW: 11.9 % (ref 11.5–15.5)
WBC: 6.1 10*3/uL (ref 4.0–10.5)
nRBC: 0 % (ref 0.0–0.2)

## 2018-11-04 LAB — TYPE AND SCREEN
ABO/RH(D): O NEG
Antibody Screen: NEGATIVE

## 2018-11-04 LAB — BASIC METABOLIC PANEL
Anion gap: 8 (ref 5–15)
BUN: 15 mg/dL (ref 6–20)
CO2: 21 mmol/L — ABNORMAL LOW (ref 22–32)
Calcium: 8.3 mg/dL — ABNORMAL LOW (ref 8.9–10.3)
Chloride: 109 mmol/L (ref 98–111)
Creatinine, Ser: 1.26 mg/dL — ABNORMAL HIGH (ref 0.44–1.00)
GFR calc Af Amer: 58 mL/min — ABNORMAL LOW (ref >60)
GFR calc non Af Amer: 50 mL/min — ABNORMAL LOW (ref >60)
Glucose, Bld: 85 mg/dL (ref 70–99)
Potassium: 3.8 mmol/L (ref 3.5–5.1)
Sodium: 138 mmol/L (ref 135–145)

## 2018-11-04 LAB — GLUCOSE, CAPILLARY: Glucose-Capillary: 85 mg/dL (ref 70–99)

## 2018-11-04 NOTE — Telephone Encounter (Signed)
Caren Griffins Nurse from [facility] called and states she has called Dr. Marlou Sa- no answer and could not leave VM due to it being full. Patient is ready to be D/C and needs for Dr. Marlou Sa to [place] in order for her to print Discharge Summary.

## 2018-11-04 NOTE — Telephone Encounter (Signed)
Done pls call thx

## 2018-11-04 NOTE — Progress Notes (Signed)
The patient received her monetary belongings that were locked in security box #7.  The patient counted the money in front of the nursing staff and the amount was correct.  The patient also had a debit card that was returned.  The patient was given a discharge packet and verbal instructions.  The patient once again was told non weight bearing on the surgical leg, emphasis was placed on keeping her blood glucose controlled for improved healing.  The patient was provided with [place] office number and was instructed to arrange follow up for next Wednesday.  The patient is wearing the knee support on discharge.  The patient will be discharged to home with her husband via wheel chair.

## 2018-11-04 NOTE — Progress Notes (Signed)
Physical Therapy Treatment Patient Details Name: Melanie Hall MRN: [id number] DOB: 19-Aug-1970 Today's Date: 11/04/2018    History of Present Illness Pt is a 48 y.o. female admitted 10/28/18 after fall sustaining L distal femur fx; now s/p L femoral IM nail 4/21. PMH includes HTN, DM, anemia, cataracts.     PT Comments    Pt self-limiting ambulation distance to room only again this session. Pt continues to use TDWB on LLE, even with frequent verbal cues from PT to maintain NWB. Pt does not acknowledge PT feedback to remain NWB and does not apply feedback either. Pt deferred any LE exercises this session, stating "I want to go home". Pt to d/c today with HHPT, and with the assist of her daughters.    Follow Up Recommendations  Home health PT;Supervision for mobility/OOB     Equipment Recommendations  Rolling walker with 5" wheels;3in1 (PT)    Recommendations for Other Services       Precautions / Restrictions Precautions Precautions: Fall Required Braces or Orthoses: Knee Immobilizer - Left Knee Immobilizer - Left: (KI in room at this time) Restrictions Weight Bearing Restrictions: Yes LLE Weight Bearing: Non weight bearing    Mobility  Bed Mobility Overal bed mobility: Needs Assistance Bed Mobility: Supine to Sit;Sit to Supine     Supine to sit: Supervision Sit to supine: Supervision   General bed mobility comments: Supervision for supine<>sit for safety, increased time and pt with use of UEs to move LLE into and out of bed.   Transfers Overall transfer level: Needs assistance Equipment used: Rolling walker (2 wheeled) Transfers: Sit to/from Stand Sit to Stand: Min guard         General transfer comment: Min guard for safety, verbal cuing for NWB on LLE, pt continued with TDWB and did not apply PT suggestion.   Ambulation/Gait Ambulation/Gait assistance: Min guard Gait Distance (Feet): 20 Feet(to and from bathroom) Assistive device: Rolling walker (2  wheeled) Gait Pattern/deviations: Step-to pattern;Decreased weight shift to left;Antalgic Gait velocity: decr    General Gait Details: Min guard for safety, again frequent VC to maintain NWB on LLE as pt continues to use TDWB intermittently. Pt does not apply PT feedback for NWB and does not acknowledge PT statement.   Stairs             Wheelchair Mobility    Modified Rankin (Stroke Patients Only)       Balance Overall balance assessment: Needs assistance Sitting-balance support: No upper extremity supported;Feet supported Sitting balance-Leahy Scale: Fair Sitting balance - Comments: able to sit EOB without PT assist     Standing balance-Leahy Scale: Poor Standing balance comment: Reliant on UE support                            Cognition Arousal/Alertness: Awake/alert Behavior During Therapy: Flat affect;WFL for tasks assessed/performed Overall Cognitive Status: No family/caregiver present to determine baseline cognitive functioning                                 General Comments: Pt states she is ready to go home, begrudgingly works with PT this session.       Exercises      General Comments General comments (skin integrity, edema, etc.): wounds dry       Pertinent Vitals/Pain Pain Assessment: Faces Faces Pain Scale: Hurts even more Pain Location: LLE Pain Descriptors /  Indicators: Grimacing;Guarding Pain Intervention(s): Limited activity within patient's tolerance;Monitored during session;Repositioned    Home Living                      Prior Function            PT Goals (current goals can now be found in the care plan section) Acute Rehab PT Goals Patient Stated Goal: Return home with help from daughters PT Goal Formulation: With patient Time For Goal Achievement: 11/12/18 Potential to Achieve Goals: Good Progress towards PT goals: Progressing toward goals    Frequency    Min 5X/week      PT Plan  Current plan remains appropriate    Co-evaluation              AM-PAC PT "6 Clicks" Mobility   Outcome Measure  Help needed turning from your back to your side while in a flat bed without using bedrails?: None Help needed moving from lying on your back to sitting on the side of a flat bed without using bedrails?: A Little Help needed moving to and from a bed to a chair (including a wheelchair)?: A Little Help needed standing up from a chair using your arms (e.g., wheelchair or bedside chair)?: A Little Help needed to walk in hospital room?: A Little Help needed climbing 3-5 steps with a railing? : A Lot 6 Click Score: 18    End of Session Equipment Utilized During Treatment: Gait belt Activity Tolerance: Patient limited by pain Patient left: in bed;with call bell/phone within reach;with bed alarm set Nurse Communication: Mobility status PT Visit Diagnosis: Other abnormalities of gait and mobility (R26.89);Pain Pain - Right/Left: Left Pain - part of body: Leg     Time: 9924-2683 PT Time Calculation (min) (ACUTE ONLY): 12 min  Charges:  $Gait Training: 8-22 mins                     Melanie Hall, PT Acute Rehabilitation Services Pager [phone]  Office [phone]    Toan Mort D Siearra Amberg 11/04/2018, 11:29 AM

## 2018-11-04 NOTE — Progress Notes (Signed)
Occupational Therapy Treatment Patient Details Name: Melanie Hall MRN: [id number] DOB: October 25, 1970 Today's Date: 11/04/2018    History of present illness Pt is a 48 y.o. female admitted 10/28/18 after fall sustaining L distal femur fx; now s/p L femoral IM nail 4/21. PMH includes HTN, DM, anemia, cataracts.    OT comments  Pt self limiting this session to bed level only education. Pt educated on sponge bath only at this time due to limited education on tub transfer with therapist. Pt provided handout for education for daughters at this time. Concern for skin break down due to prolonged supine positioning upon d/c.    Follow Up Recommendations  Home health OT;Supervision/Assistance - 24 hour    Equipment Recommendations  (in room on arrival)    Recommendations for Other Services      Precautions / Restrictions Precautions Precautions: Fall Required Braces or Orthoses: Knee Immobilizer - Left Knee Immobilizer - Left: (KI in room at this time) Restrictions Weight Bearing Restrictions: Yes LLE Weight Bearing: Non weight bearing       Mobility Bed Mobility                  Transfers                 General transfer comment: declined    Balance                                           ADL either performed or assessed with clinical judgement   ADL Overall ADL's : Needs assistance/impaired Eating/Feeding: Independent;Bed level                     Lower Body Dressing Details (indicate cue type and reason): educated on LB bathing and dressing. AE sponge and reacher in room at this time. pt simulates reacher from supine only. pt educated on washing around the wounds with clean wash cloth               General ADL Comments: Pt self limiting at this time and decline oob this session. pt educated 3n1 setup for tub and handout provided so daughters can (A). pt declined multiple request for OOB     Vision       Perception      Praxis      Cognition Arousal/Alertness: Awake/alert Behavior During Therapy: Flat affect Overall Cognitive Status: No family/caregiver present to determine baseline cognitive functioning                                          Exercises     Shoulder Instructions       General Comments wounds dry     Pertinent Vitals/ Pain       Pain Assessment: Faces Faces Pain Scale: Hurts little more Pain Location: abdomen Pain Descriptors / Indicators: Grimacing Pain Intervention(s): Monitored during session;Limited activity within patient's tolerance  Home Living                                          Prior Functioning/Environment              Frequency  Min 3X/week  Progress Toward Goals  OT Goals(current goals can now be found in the care plan section)  Progress towards OT goals: Not progressing toward goals - comment(refusal for OOB)  Acute Rehab OT Goals Patient Stated Goal: Return home with help from daughters OT Goal Formulation: With patient Time For Goal Achievement: 11/13/18 Potential to Achieve Goals: Fair ADL Goals Pt Will Perform Lower Body Bathing: with min guard assist;with adaptive equipment;sit to/from stand Pt Will Perform Lower Body Dressing: with min guard assist;sit to/from stand;with adaptive equipment Pt Will Transfer to Toilet: with min guard assist;ambulating;bedside commode Pt Will Perform Toileting - Clothing Manipulation and hygiene: with min guard assist;sit to/from stand Pt Will Perform Tub/Shower Transfer: Tub transfer;with min assist;ambulating;3 in 1;rolling walker  Plan Discharge plan remains appropriate    Co-evaluation                 AM-PAC OT "6 Clicks" Daily Activity     Outcome Measure   Help from another person eating meals?: None Help from another person taking care of personal grooming?: None Help from another person toileting, which includes using toliet, bedpan, or  urinal?: A Lot Help from another person bathing (including washing, rinsing, drying)?: A Lot Help from another person to put on and taking off regular upper body clothing?: None Help from another person to put on and taking off regular lower body clothing?: A Lot 6 Click Score: 18    End of Session    OT Visit Diagnosis: Unsteadiness on feet (R26.81);History of falling (Z91.81);Pain Pain - Right/Left: Left Pain - part of body: Leg;Hip   Activity Tolerance Patient limited by pain   Patient Left in bed;with call bell/phone within reach   Nurse Communication Mobility status;Precautions        Time: 4097-3532 OT Time Calculation (min): 18 min  Charges: OT General Charges $OT Visit: 1 Visit OT Treatments $Self Care/Home Management : 8-22 mins   Jeri Modena, OTR/L  Acute Rehabilitation Services Pager: [phone] Office: [phone] .    Jeri Modena 11/04/2018, 9:07 AM

## 2018-11-04 NOTE — Telephone Encounter (Signed)
Please advise 

## 2018-11-04 NOTE — Progress Notes (Signed)
[place] office was called and made aware for Dr Marlou Sa that the discharge reconciliation has not been completed .

## 2018-11-05 NOTE — Discharge Summary (Signed)
Physician Discharge Summary  Patient ID: Melanie Hall MRN: [id number] DOB/AGE: 1971/06/19 48 y.o.  Admit date: 10/28/2018 Discharge date: 11/04/2018  Admission Diagnoses:  Principal Problem:   Closed fracture of left femur ([place]) Active Problems:   Uncontrolled diabetes mellitus with hyperglycemia (HCC)   Normocytic anemia   Hypertension associated with diabetes [facility])   Discharge Diagnoses:  Same  Surgeries: Procedure(s): RETROGRADE FEMORAL NAILING on 10/28/2018   Consultants: Treatment Team:  Meredith Pel, MD  Discharged Condition: Stable  Hospital Course: Melanie Hall is an 48 y.o. female who was admitted 10/28/2018 with a chief complaint of  Chief Complaint  Patient presents with  . Knee Pain  , and found to have a diagnosis of Closed fracture of left femur ([place]).  They were brought to the operating room on 10/28/2018 and underwent the above named procedures.  Patient did well with surgery.  Pain was reasonably well controlled by the time of discharge.  She was seen by physical therapy and occupational therapy for assistance with mobilization range of motion.  She was started on CPM machine also for range of motion.  Her bone quality was poor.  She will need to be maintained on nonweightbearing left lower extremity.  Discharged home in good condition with improved mobility.  She does have people at home to help her.  Discharged home on 1 baby aspirin twice a day along with pain medicine and muscle relaxers.  I will see her back in a week.  Antibiotics given:  Anti-infectives (From admission, onward)   Start     Dose/Rate Route Frequency Ordered Stop   10/28/18 2200  ceFAZolin (ANCEF) IVPB 2g/100 mL premix     2 g 200 mL/hr over 30 Minutes Intravenous Every 6 hours 10/28/18 2033 10/29/18 0336   10/28/18 1600  ceFAZolin (ANCEF) IVPB 2g/100 mL premix     2 g 200 mL/hr over 30 Minutes Intravenous On call to O.R. 10/28/18 1535 10/28/18 1635   10/28/18 1545  ceFAZolin  (ANCEF) 2-4 GM/100ML-% IVPB    Note to Pharmacy:  Henrine Screws   : cabinet override      10/28/18 1545 10/28/18 1635    .  Recent vital signs:  Vitals:   11/03/18 2116 11/04/18 0402  BP: (!) 167/81 (!) 170/89  Pulse: (!) 104 97  Resp:  20  Temp:  99.3 F (37.4 C)  SpO2:  97%    Recent laboratory studies:  Results for orders placed or performed during the hospital encounter of 22/63/33  Basic metabolic panel  Result Value Ref Range   Sodium 134 (L) 135 - 145 mmol/L   Potassium 3.7 3.5 - 5.1 mmol/L   Chloride 104 98 - 111 mmol/L   CO2 23 22 - 32 mmol/L   Glucose, Bld 345 (H) 70 - 99 mg/dL   BUN 17 6 - 20 mg/dL   Creatinine, Ser 1.15 (H) 0.44 - 1.00 mg/dL   Calcium 8.1 (L) 8.9 - 10.3 mg/dL   GFR calc non Af Amer 56 (L) >60 mL/min   GFR calc Af Amer >60 >60 mL/min   Anion gap 7 5 - 15  CBC with Differential  Result Value Ref Range   WBC 10.1 4.0 - 10.5 K/uL   RBC 3.41 (L) 3.87 - 5.11 MIL/uL   Hemoglobin 10.8 (L) 12.0 - 15.0 g/dL   HCT 30.0 (L) 36.0 - 46.0 %   MCV 88.0 80.0 - 100.0 fL   MCH 31.7 26.0 - 34.0 pg  MCHC 36.0 30.0 - 36.0 g/dL   RDW 11.8 11.5 - 15.5 %   Platelets 235 150 - 400 K/uL   nRBC 0.0 0.0 - 0.2 %   Neutrophils Relative % 73 %   Neutro Abs 7.5 1.7 - 7.7 K/uL   Lymphocytes Relative 15 %   Lymphs Abs 1.5 0.7 - 4.0 K/uL   Monocytes Relative 7 %   Monocytes Absolute 0.7 0.1 - 1.0 K/uL   Eosinophils Relative 3 %   Eosinophils Absolute 0.3 0.0 - 0.5 K/uL   Basophils Relative 1 %   Basophils Absolute 0.1 0.0 - 0.1 K/uL   Immature Granulocytes 1 %   Abs Immature Granulocytes 0.07 0.00 - 0.07 K/uL  Glucose, capillary  Result Value Ref Range   Glucose-Capillary 328 (H) 70 - 99 mg/dL   Comment 1 Notify RN    Comment 2 Document in Chart   Glucose, capillary  Result Value Ref Range   Glucose-Capillary 243 (H) 70 - 99 mg/dL  Glucose, capillary  Result Value Ref Range   Glucose-Capillary 221 (H) 70 - 99 mg/dL  Hemoglobin and hematocrit, blood  Result  Value Ref Range   Hemoglobin 9.8 (L) 12.0 - 15.0 g/dL   HCT 27.4 (L) 36.0 - 46.0 %  CBC  Result Value Ref Range   WBC 9.9 4.0 - 10.5 K/uL   RBC 2.80 (L) 3.87 - 5.11 MIL/uL   Hemoglobin 9.0 (L) 12.0 - 15.0 g/dL   HCT 24.9 (L) 36.0 - 46.0 %   MCV 88.9 80.0 - 100.0 fL   MCH 32.1 26.0 - 34.0 pg   MCHC 36.1 (H) 30.0 - 36.0 g/dL   RDW 12.2 11.5 - 15.5 %   Platelets 171 150 - 400 K/uL   nRBC 0.0 0.0 - 0.2 %  Basic metabolic panel  Result Value Ref Range   Sodium 136 135 - 145 mmol/L   Potassium 3.7 3.5 - 5.1 mmol/L   Chloride 105 98 - 111 mmol/L   CO2 22 22 - 32 mmol/L   Glucose, Bld 188 (H) 70 - 99 mg/dL   BUN 19 6 - 20 mg/dL   Creatinine, Ser 1.29 (H) 0.44 - 1.00 mg/dL   Calcium 8.0 (L) 8.9 - 10.3 mg/dL   GFR calc non Af Amer 49 (L) >60 mL/min   GFR calc Af Amer 57 (L) >60 mL/min   Anion gap 9 5 - 15  Glucose, capillary  Result Value Ref Range   Glucose-Capillary 167 (H) 70 - 99 mg/dL  Glucose, capillary  Result Value Ref Range   Glucose-Capillary 131 (H) 70 - 99 mg/dL  Glucose, capillary  Result Value Ref Range   Glucose-Capillary 83 70 - 99 mg/dL  CBC  Result Value Ref Range   WBC 8.5 4.0 - 10.5 K/uL   RBC 2.75 (L) 3.87 - 5.11 MIL/uL   Hemoglobin 8.8 (L) 12.0 - 15.0 g/dL   HCT 24.8 (L) 36.0 - 46.0 %   MCV 90.2 80.0 - 100.0 fL   MCH 32.0 26.0 - 34.0 pg   MCHC 35.5 30.0 - 36.0 g/dL   RDW 12.4 11.5 - 15.5 %   Platelets 160 150 - 400 K/uL   nRBC 0.0 0.0 - 0.2 %  Basic metabolic panel  Result Value Ref Range   Sodium 137 135 - 145 mmol/L   Potassium 3.4 (L) 3.5 - 5.1 mmol/L   Chloride 108 98 - 111 mmol/L   CO2 20 (L) 22 - 32 mmol/L  Glucose, Bld 110 (H) 70 - 99 mg/dL   BUN 18 6 - 20 mg/dL   Creatinine, Ser 1.35 (H) 0.44 - 1.00 mg/dL   Calcium 8.2 (L) 8.9 - 10.3 mg/dL   GFR calc non Af Amer 46 (L) >60 mL/min   GFR calc Af Amer 54 (L) >60 mL/min   Anion gap 9 5 - 15  Glucose, capillary  Result Value Ref Range   Glucose-Capillary 85 70 - 99 mg/dL  Glucose,  capillary  Result Value Ref Range   Glucose-Capillary 112 (H) 70 - 99 mg/dL  Glucose, capillary  Result Value Ref Range   Glucose-Capillary 189 (H) 70 - 99 mg/dL  CBC  Result Value Ref Range   WBC 8.9 4.0 - 10.5 K/uL   RBC 2.45 (L) 3.87 - 5.11 MIL/uL   Hemoglobin 7.9 (L) 12.0 - 15.0 g/dL   HCT 22.4 (L) 36.0 - 46.0 %   MCV 91.4 80.0 - 100.0 fL   MCH 32.2 26.0 - 34.0 pg   MCHC 35.3 30.0 - 36.0 g/dL   RDW 12.4 11.5 - 15.5 %   Platelets 139 (L) 150 - 400 K/uL   nRBC 0.0 0.0 - 0.2 %  Basic metabolic panel  Result Value Ref Range   Sodium 135 135 - 145 mmol/L   Potassium 3.8 3.5 - 5.1 mmol/L   Chloride 106 98 - 111 mmol/L   CO2 21 (L) 22 - 32 mmol/L   Glucose, Bld 188 (H) 70 - 99 mg/dL   BUN 21 (H) 6 - 20 mg/dL   Creatinine, Ser 1.39 (H) 0.44 - 1.00 mg/dL   Calcium 8.0 (L) 8.9 - 10.3 mg/dL   GFR calc non Af Amer 45 (L) >60 mL/min   GFR calc Af Amer 52 (L) >60 mL/min   Anion gap 8 5 - 15  Glucose, capillary  Result Value Ref Range   Glucose-Capillary 214 (H) 70 - 99 mg/dL  Glucose, capillary  Result Value Ref Range   Glucose-Capillary 160 (H) 70 - 99 mg/dL  Glucose, capillary  Result Value Ref Range   Glucose-Capillary 94 70 - 99 mg/dL  Glucose, capillary  Result Value Ref Range   Glucose-Capillary 75 70 - 99 mg/dL  Glucose, capillary  Result Value Ref Range   Glucose-Capillary 92 70 - 99 mg/dL  Glucose, capillary  Result Value Ref Range   Glucose-Capillary 132 (H) 70 - 99 mg/dL   Comment 1 Document in Chart   Glucose, capillary  Result Value Ref Range   Glucose-Capillary 69 (L) 70 - 99 mg/dL  CBC  Result Value Ref Range   WBC 7.3 4.0 - 10.5 K/uL   RBC 2.60 (L) 3.87 - 5.11 MIL/uL   Hemoglobin 8.3 (L) 12.0 - 15.0 g/dL   HCT 24.0 (L) 36.0 - 46.0 %   MCV 92.3 80.0 - 100.0 fL   MCH 31.9 26.0 - 34.0 pg   MCHC 34.6 30.0 - 36.0 g/dL   RDW 12.1 11.5 - 15.5 %   Platelets 173 150 - 400 K/uL   nRBC 0.0 0.0 - 0.2 %  Basic metabolic panel  Result Value Ref Range    Sodium 136 135 - 145 mmol/L   Potassium 3.9 3.5 - 5.1 mmol/L   Chloride 105 98 - 111 mmol/L   CO2 21 (L) 22 - 32 mmol/L   Glucose, Bld 96 70 - 99 mg/dL   BUN 18 6 - 20 mg/dL   Creatinine, Ser 1.29 (H) 0.44 - 1.00 mg/dL  Calcium 8.0 (L) 8.9 - 10.3 mg/dL   GFR calc non Af Amer 49 (L) >60 mL/min   GFR calc Af Amer 57 (L) >60 mL/min   Anion gap 10 5 - 15  Glucose, capillary  Result Value Ref Range   Glucose-Capillary 91 70 - 99 mg/dL  CBC  Result Value Ref Range   WBC 6.5 4.0 - 10.5 K/uL   RBC 2.33 (L) 3.87 - 5.11 MIL/uL   Hemoglobin 7.4 (L) 12.0 - 15.0 g/dL   HCT 21.6 (L) 36.0 - 46.0 %   MCV 92.7 80.0 - 100.0 fL   MCH 31.8 26.0 - 34.0 pg   MCHC 34.3 30.0 - 36.0 g/dL   RDW 12.0 11.5 - 15.5 %   Platelets 182 150 - 400 K/uL   nRBC 0.0 0.0 - 0.2 %  Glucose, capillary  Result Value Ref Range   Glucose-Capillary 79 70 - 99 mg/dL  Glucose, capillary  Result Value Ref Range   Glucose-Capillary 85 70 - 99 mg/dL  Glucose, capillary  Result Value Ref Range   Glucose-Capillary 67 (L) 70 - 99 mg/dL  Glucose, capillary  Result Value Ref Range   Glucose-Capillary 72 70 - 99 mg/dL  Glucose, capillary  Result Value Ref Range   Glucose-Capillary 96 70 - 99 mg/dL  Comprehensive metabolic panel  Result Value Ref Range   Sodium 136 135 - 145 mmol/L   Potassium 3.7 3.5 - 5.1 mmol/L   Chloride 106 98 - 111 mmol/L   CO2 23 22 - 32 mmol/L   Glucose, Bld 75 70 - 99 mg/dL   BUN 19 6 - 20 mg/dL   Creatinine, Ser 1.26 (H) 0.44 - 1.00 mg/dL   Calcium 8.1 (L) 8.9 - 10.3 mg/dL   Total Protein 4.6 (L) 6.5 - 8.1 g/dL   Albumin 1.5 (L) 3.5 - 5.0 g/dL   AST 12 (L) 15 - 41 U/L   ALT 8 0 - 44 U/L   Alkaline Phosphatase 68 38 - 126 U/L   Total Bilirubin 0.9 0.3 - 1.2 mg/dL   GFR calc non Af Amer 50 (L) >60 mL/min   GFR calc Af Amer 58 (L) >60 mL/min   Anion gap 7 5 - 15  Iron and TIBC  Result Value Ref Range   Iron 12 (L) 28 - 170 ug/dL   TIBC 165 (L) 250 - 450 ug/dL   Saturation Ratios 7 (L)  10.4 - 31.8 %   UIBC 153 ug/dL  CBC  Result Value Ref Range   WBC 6.1 4.0 - 10.5 K/uL   RBC 2.28 (L) 3.87 - 5.11 MIL/uL   Hemoglobin 7.2 (L) 12.0 - 15.0 g/dL   HCT 21.2 (L) 36.0 - 46.0 %   MCV 93.0 80.0 - 100.0 fL   MCH 31.6 26.0 - 34.0 pg   MCHC 34.0 30.0 - 36.0 g/dL   RDW 11.9 11.5 - 15.5 %   Platelets 188 150 - 400 K/uL   nRBC 0.0 0.0 - 0.2 %  Basic metabolic panel  Result Value Ref Range   Sodium 139 135 - 145 mmol/L   Potassium 3.8 3.5 - 5.1 mmol/L   Chloride 109 98 - 111 mmol/L   CO2 22 22 - 32 mmol/L   Glucose, Bld 94 70 - 99 mg/dL   BUN 18 6 - 20 mg/dL   Creatinine, Ser 1.56 (H) 0.44 - 1.00 mg/dL   Calcium 8.0 (L) 8.9 - 10.3 mg/dL   GFR calc non Af Wyvonnia Lora  39 (L) >60 mL/min   GFR calc Af Amer 45 (L) >60 mL/min   Anion gap 8 5 - 15  Glucose, capillary  Result Value Ref Range   Glucose-Capillary 72 70 - 99 mg/dL  Glucose, capillary  Result Value Ref Range   Glucose-Capillary 82 70 - 99 mg/dL   Comment 1 Document in Chart   Glucose, capillary  Result Value Ref Range   Glucose-Capillary 93 70 - 99 mg/dL  Glucose, capillary  Result Value Ref Range   Glucose-Capillary 115 (H) 70 - 99 mg/dL  Lipase, blood  Result Value Ref Range   Lipase 19 11 - 51 U/L  Glucose, capillary  Result Value Ref Range   Glucose-Capillary 96 70 - 99 mg/dL  Glucose, capillary  Result Value Ref Range   Glucose-Capillary 118 (H) 70 - 99 mg/dL  CBC  Result Value Ref Range   WBC 6.1 4.0 - 10.5 K/uL   RBC 2.33 (L) 3.87 - 5.11 MIL/uL   Hemoglobin 7.4 (L) 12.0 - 15.0 g/dL   HCT 21.3 (L) 36.0 - 46.0 %   MCV 91.4 80.0 - 100.0 fL   MCH 31.8 26.0 - 34.0 pg   MCHC 34.7 30.0 - 36.0 g/dL   RDW 11.9 11.5 - 15.5 %   Platelets 242 150 - 400 K/uL   nRBC 0.0 0.0 - 0.2 %  Basic metabolic panel  Result Value Ref Range   Sodium 138 135 - 145 mmol/L   Potassium 3.8 3.5 - 5.1 mmol/L   Chloride 109 98 - 111 mmol/L   CO2 21 (L) 22 - 32 mmol/L   Glucose, Bld 85 70 - 99 mg/dL   BUN 15 6 - 20 mg/dL    Creatinine, Ser 1.26 (H) 0.44 - 1.00 mg/dL   Calcium 8.3 (L) 8.9 - 10.3 mg/dL   GFR calc non Af Amer 50 (L) >60 mL/min   GFR calc Af Amer 58 (L) >60 mL/min   Anion gap 8 5 - 15  Glucose, capillary  Result Value Ref Range   Glucose-Capillary 85 70 - 99 mg/dL  CBG monitoring, ED  Result Value Ref Range   Glucose-Capillary 315 (H) 70 - 99 mg/dL  CBG monitoring, ED  Result Value Ref Range   Glucose-Capillary 328 (H) 70 - 99 mg/dL  I-STAT 4, (NA,K, GLUC, HGB,HCT)  Result Value Ref Range   Sodium 137 135 - 145 mmol/L   Potassium 3.8 3.5 - 5.1 mmol/L   Glucose, Bld 319 (H) 70 - 99 mg/dL   HCT 23.0 (L) 36.0 - 46.0 %   Hemoglobin 7.8 (L) 12.0 - 15.0 g/dL   Comment NOTIFIED PHYSICIAN   Type and screen  Result Value Ref Range   ABO/RH(D) O NEG    Antibody Screen NEG    Sample Expiration 10/31/2018    Unit Number [id number]    Blood Component Type RED CELLS,LR    Unit division 00    Status of Unit ISSUED,FINAL    Transfusion Status OK TO TRANSFUSE    Crossmatch Result Compatible    Unit Number [id number]    Blood Component Type RED CELLS,LR    Unit division 00    Status of Unit REL FROM [facility]    Transfusion Status OK TO TRANSFUSE    Crossmatch Result      Compatible Performed at [facility], [address] [place], [place] [address]   Prepare RBC  Result Value Ref Range   Order Confirmation  ORDER PROCESSED BY BLOOD BANK Performed at [facility], [place] [address], [place], [place] [address]   ABO/Rh  Result Value Ref Range   ABO/RH(D)      O NEG Performed at [place] [address], [place], [place] [address]   Prepare RBC  Result Value Ref Range   Order Confirmation      ORDER PROCESSED BY BLOOD BANK Performed at [facility], [place] [address] [place], [place] [address]   Type and screen [place]  Result Value Ref Range   ABO/RH(D) O NEG    Antibody Screen NEG    Sample Expiration       11/06/2018 Performed at [facility], [place] [address] [place], [place] [address]   BPAM [facility]  Result Value Ref Range   ISSUE DATE / TIME [id number]    Blood Product Unit Number [id number]    PRODUCT CODE [id number]    Unit Type and Rh 9500    Blood Product Expiration Date 585277824235    ISSUE DATE / TIME 361443154008    Blood Product Unit Number [id number]    PRODUCT CODE [id number]    Unit Type and Rh 9500    Blood Product Expiration Date [id number]     Discharge Medications:   Allergies as of 11/04/2018   No Known Allergies     Medication List    STOP taking these medications   ibuprofen 800 MG tablet Commonly known as:  ADVIL   ondansetron 8 MG disintegrating tablet Commonly known as:  Zofran ODT   Pen Needles 31G X 8 MM Misc     TAKE these medications   acetaminophen 325 MG tablet Commonly known as:  TYLENOL Take 2 tablets (650 mg total) by mouth every 6 (six) hours as needed for mild pain or headache (or Fever >/= 101).   aspirin 81 MG EC tablet Take 1 tablet (81 mg total) by mouth 2 (two) times daily.   blood glucose meter kit and supplies Kit Dispense based on patient and insurance preference. Use to check blood sugar three times daily as directed. (FOR ICD-9 250.00, 250.01).   insulin detemir 100 UNIT/ML injection Commonly known as:  LEVEMIR Inject 0.08 mLs (8 Units total) into the skin at bedtime. What changed:    medication strength  how much to take  when to take this   lisinopril 5 MG tablet Commonly known as:  ZESTRIL Take 1 tablet (5 mg total) by mouth daily.   metFORMIN 500 MG tablet Commonly known as:  Glucophage Take 1 tablet (500 mg total) by mouth 2 (two) times daily with a meal.   methocarbamol 500 MG tablet Commonly known as:  ROBAXIN Take 1 tablet (500 mg total) by mouth every 6 (six) hours as needed for muscle spasms.   oxyCODONE 5 MG immediate release tablet Commonly known as:  Oxy IR/ROXICODONE Take 1 tablet (5  mg total) by mouth every 4 (four) hours as needed (40m for mild pain, 11mfor moderate pain, 1574mor severe pain).   pantoprazole 40 MG tablet Commonly known as:  Protonix Take 1 tablet (40 mg total) by mouth daily.       Diagnostic Studies: Ct Abdomen Pelvis Wo Contrast  Result Date: 11/03/2018 CLINICAL DATA:  Nausea, vomiting, recent nail fixation of the femur EXAM: CT ABDOMEN AND PELVIS WITHOUT CONTRAST TECHNIQUE: Multidetector CT imaging of the abdomen and pelvis was performed following the standard protocol without IV contrast.  COMPARISON:  CT abdomen pelvis, 08/26/2018 FINDINGS: Lower chest: Small bilateral pleural effusions and associated atelectasis or consolidation. Hepatobiliary: No focal liver abnormality is seen. Small calcified gallstones and/or sludge. No gallbladder wall thickening, or biliary dilatation. Pancreas: Unremarkable. No pancreatic ductal dilatation or surrounding inflammatory changes. Spleen: Normal in size without focal abnormality. Adrenals/Urinary Tract: Adrenal glands are unremarkable. Kidneys are normal, without renal calculi, focal lesion, or hydronephrosis. Bladder is unremarkable. Stomach/Bowel: Stomach is within normal limits. Appendix appears normal. No evidence of bowel wall thickening, distention, or inflammatory changes. Vascular/Lymphatic: No significant vascular findings are present. No enlarged abdominal or pelvic lymph nodes. Reproductive: No mass or other abnormality. Other: No abdominal wall hernia or abnormality. Anasarca. Trace abdominal and pelvic ascites. Musculoskeletal: No acute or significant osseous findings. IMPRESSION: 1. No acute noncontrast CT findings of the abdomen or pelvis to explain nausea or vomiting. No evidence of bowel obstruction. 2.  Gallstones.  No CT evidence of acute cholecystitis. 3.  Pleural effusions, trace ascites, and anasarca. Electronically Signed   By: Eddie Candle M.D.   On: 11/03/2018 15:56   Dg Knee 1-2 Views  Left  Result Date: 10/28/2018 CLINICAL DATA:  Fall. Pain. EXAM: LEFT KNEE - 1-2 VIEW COMPARISON:  08/26/2018 FINDINGS: There is an oblique angulated fracture of the distal femur, supracondylar location. Soft tissue swelling. Positive for suprapatellar bursa effusion. No knee dislocation. IMPRESSION: Oblique angulated supracondylar fracture of the distal femur. Electronically Signed   By: Staci Righter M.D.   On: 10/28/2018 12:58   Dg C-arm 1-60 Min  Result Date: 10/28/2018 CLINICAL DATA:  Fall. Pain. EXAM: DG C-ARM 61-120 MIN; LEFT FEMUR PORTABLE 2 VIEWS; LEFT FEMUR 2 VIEWS COMPARISON:  Plain films earlier today. FINDINGS: Intraoperative spot films demonstrate open reduction internal fixation of a distal femur fracture. Intramedullary rod is been anchored with by cortical screws. IMPRESSION: As above. Improved position and alignment. Electronically Signed   By: Staci Righter M.D.   On: 10/28/2018 20:08   Dg Femur Min 2 Views Left  Result Date: 11/02/2018 CLINICAL DATA:  Pain.  Fall. EXAM: LEFT FEMUR 2 VIEWS COMPARISON:  October 28, 2018 FINDINGS: An intramedullary rod is seen in the left femur with interlocking distal and proximal screws. The distal femoral fracture is again seen. No acute fractures noted. IMPRESSION: No acute interval fracture. The left intramedullary rod is in good position. The distal femoral fracture is stable. Electronically Signed   By: Dorise Bullion III M.D   On: 11/02/2018 08:31   Dg Femur Min 2 Views Left  Result Date: 10/28/2018 CLINICAL DATA:  Fall. Pain. EXAM: DG C-ARM 61-120 MIN; LEFT FEMUR PORTABLE 2 VIEWS; LEFT FEMUR 2 VIEWS COMPARISON:  Plain films earlier today. FINDINGS: Intraoperative spot films demonstrate open reduction internal fixation of a distal femur fracture. Intramedullary rod is been anchored with by cortical screws. IMPRESSION: As above. Improved position and alignment. Electronically Signed   By: Staci Righter M.D.   On: 10/28/2018 20:08   Dg Femur Port  Min 2 Views Left  Result Date: 10/28/2018 CLINICAL DATA:  Fall. Pain. EXAM: DG C-ARM 61-120 MIN; LEFT FEMUR PORTABLE 2 VIEWS; LEFT FEMUR 2 VIEWS COMPARISON:  Plain films earlier today. FINDINGS: Intraoperative spot films demonstrate open reduction internal fixation of a distal femur fracture. Intramedullary rod is been anchored with by cortical screws. IMPRESSION: As above. Improved position and alignment. Electronically Signed   By: Staci Righter M.D.   On: 10/28/2018 20:08    Disposition:   Discharge Instructions  Call MD / Call 911   Complete by:  As directed    If you experience chest pain or shortness of breath, CALL 911 and be transported to the hospital emergency room.  If you develope a fever above 101 F, pus (white drainage) or increased drainage or redness at the wound, or calf pain, call your surgeon's office.   Call MD / Call 911   Complete by:  As directed    If you experience chest pain or shortness of breath, CALL 911 and be transported to the hospital emergency room.  If you develope a fever above 101 F, pus (white drainage) or increased drainage or redness at the wound, or calf pain, call your surgeon's office.   Constipation Prevention   Complete by:  As directed    Drink plenty of fluids.  Prune juice may be helpful.  You may use a stool softener, such as Colace (over the counter) 100 mg twice a day.  Use MiraLax (over the counter) for constipation as needed.   Constipation Prevention   Complete by:  As directed    Drink plenty of fluids.  Prune juice may be helpful.  You may use a stool softener, such as Colace (over the counter) 100 mg twice a day.  Use MiraLax (over the counter) for constipation as needed.   Diet - low sodium heart healthy   Complete by:  As directed    Diet - low sodium heart healthy   Complete by:  As directed    Discharge instructions   Complete by:  As directed    Non weight bearing left leg   Increase activity slowly as tolerated   Complete by:   As directed    Increase activity slowly as tolerated   Complete by:  As directed       Follow-up Information    [facility]. Schedule an appointment as soon as possible for a visit.   Contact information: [place] up.   Specialty:  [facility] Contact information: [place] [address] [phone]            Signed: Anderson Malta 11/05/2018, 10:23 AM

## 2018-11-10 ENCOUNTER — Ambulatory Visit (INDEPENDENT_AMBULATORY_CARE_PROVIDER_SITE_OTHER): Payer: Self-pay

## 2018-11-10 ENCOUNTER — Encounter: Payer: Self-pay | Admitting: Orthopedic Surgery

## 2018-11-10 ENCOUNTER — Telehealth: Payer: Self-pay

## 2018-11-10 ENCOUNTER — Other Ambulatory Visit: Payer: Self-pay

## 2018-11-10 ENCOUNTER — Ambulatory Visit (INDEPENDENT_AMBULATORY_CARE_PROVIDER_SITE_OTHER): Payer: Self-pay | Admitting: Orthopedic Surgery

## 2018-11-10 DIAGNOSIS — S72402D Unspecified fracture of lower end of left femur, subsequent encounter for closed fracture with routine healing: Secondary | ICD-10-CM

## 2018-11-10 MED ORDER — OXYCODONE HCL 5 MG PO TABS: 5.0000 mg | ORAL_TABLET | Freq: Four times a day (QID) | ORAL | 0 refills | Status: DC | PRN

## 2018-11-10 MED ORDER — METHOCARBAMOL 500 MG PO TABS: 500.0000 mg | ORAL_TABLET | Freq: Three times a day (TID) | ORAL | 0 refills | Status: DC | PRN

## 2018-11-10 NOTE — Progress Notes (Signed)
Post-Op Visit Note   Patient: Melanie Hall           Date of Birth: [birth year]           MRN: [id number] Visit Date: 11/10/2018 PCP: Patient, No Pcp Per   Assessment & Plan:  Chief Complaint:  Chief Complaint  Patient presents with  . Left Leg - Routine Post Op   Visit Diagnoses:  1. Closed fracture of distal end of left femur with routine healing, unspecified fracture morphology, subsequent encounter     Plan: Seen is patient is now about 2 weeks out left femur fracture retrograde IM nail.  She has been doing well.  She is been nonweightbearing.  No calf tenderness on exam.  Incisions look intact and sutures are removed.  One lateral incision looks slightly moist but otherwise is intact.  We did Steri-Strip with benzoin today and then I will see her back in about 3 weeks for clinical recheck and likely initiation of weightbearing if we see callus formation on plain radiographs.  Follow-Up Instructions: Return in about 3 weeks (around 12/01/2018).   Orders:  Orders Placed This Encounter  Procedures  . XR FEMUR MIN 2 VIEWS LEFT   No orders of the defined types were placed in this encounter.   Imaging: Xr Femur Min 2 Views Left  Result Date: 11/10/2018 AP lateral left femur reviewed.  Intramedullary nail stabilizes distal femur fracture.  No evidence of hardware complication or loosening.  No evidence of callus formation.   PMFS History: Patient Active Problem List   Diagnosis Date Noted  . Closed fracture of left femur ([place]) 10/28/2018  . Hypertension associated with diabetes ([place]) 10/28/2018  . Sprain of left ankle   . Emphysematous cystitis 08/26/2018  . Vaginosis 08/26/2018  . Hypokalemia 08/26/2018  . Normocytic anemia 08/26/2018  . Closed fracture of right ankle 11/06/2017  . IRREGULAR MENSES 09/14/2009  . BURN, FIRST DEGREE, ARM 09/14/2009  . HYPERCHOLESTEROLEMIA 11/19/2008  . PARONYCHIA, RIGHT GREAT TOE 07/30/2008  . ABSCESS, TOOTH 03/31/2008  .  Uncontrolled diabetes mellitus with hyperglycemia ([place]) 07/02/2007  . CATARACTS, BILATERAL 07/02/2007   Past Medical History:  Diagnosis Date  . Cataracts, bilateral    surgery to remove  . Diabetes mellitus    type 2  . GERD (gastroesophageal reflux disease)   . Hypertension   . SVD (spontaneous vaginal delivery)    x 4    Family History  Problem Relation Age of Onset  . Diabetes Mother   . Hypertension Mother   . Bipolar disorder Mother   . Brain cancer Maternal Aunt   . Heart disease Maternal Grandmother   . Diabetes Maternal Grandmother   . Breast cancer Maternal Aunt     Past Surgical History:  Procedure Laterality Date  . EYE SURGERY Bilateral    cataracts removed  . FEMUR IM NAIL Left 10/28/2018   Procedure: RETROGRADE FEMORAL NAILING;  Surgeon: Meredith Pel, MD;  Location: [facility];  Service: Orthopedics;  Laterality: Left;  . IM NAILING FEMORAL SHAFT RETROGRADE Left 10/28/2018  . KNEE SURGERY    . TUBAL LIGATION     Social History   Occupational History  . Not on file  Tobacco Use  . Smoking status: Former Smoker    Types: Cigarettes    Last attempt to quit: 1997    Years since quitting: 23.3  . Smokeless tobacco: Never Used  Substance and Sexual Activity  . Alcohol use: No  . Drug use:  No  . Sexual activity: Yes    Birth control/protection: Surgical

## 2018-11-10 NOTE — Telephone Encounter (Signed)
Attempted call to patient of [facility], message from C. Broadnax of [facility] that client had called and wanted someone to call her back, She had been discharged from [facility] on 11/04/18 and had not heard from [facility] yet. Home health had been arranged by hospital staff prior to discharge.  Called client, no answer, left message requesting that client  return call as soon as possible.    Debria Garret RN

## 2018-12-03 ENCOUNTER — Ambulatory Visit (INDEPENDENT_AMBULATORY_CARE_PROVIDER_SITE_OTHER): Payer: Self-pay | Admitting: Orthopedic Surgery

## 2018-12-03 ENCOUNTER — Ambulatory Visit (INDEPENDENT_AMBULATORY_CARE_PROVIDER_SITE_OTHER): Payer: Self-pay

## 2018-12-03 ENCOUNTER — Encounter: Payer: Self-pay | Admitting: Orthopedic Surgery

## 2018-12-03 ENCOUNTER — Other Ambulatory Visit: Payer: Self-pay

## 2018-12-03 VITALS — Ht 62.0 in | Wt 135.0 lb

## 2018-12-03 DIAGNOSIS — S72402D Unspecified fracture of lower end of left femur, subsequent encounter for closed fracture with routine healing: Secondary | ICD-10-CM

## 2018-12-03 MED ORDER — OXYCODONE HCL 5 MG PO TABS: 5.0000 mg | ORAL_TABLET | Freq: Two times a day (BID) | ORAL | 0 refills | Status: DC | PRN

## 2018-12-03 NOTE — Progress Notes (Signed)
Post-Op Visit Note   Patient: Melanie Hall           Date of Birth: [birth year]           MRN: [id number] Visit Date: 12/03/2018 PCP: Patient, No Pcp Per   Assessment & Plan:  Chief Complaint:  Chief Complaint  Patient presents with  . Left Leg - Follow-up    10/28/2018 Left femoral nailing   Visit Diagnoses:  1. Closed fracture of distal end of left femur with routine healing, unspecified fracture morphology, subsequent encounter     Plan: Melanie Hall is a patient who is now about 6 weeks out from left distal femur fracture.  She has been doing well walking a short distance with a walker.  On exam she has full extension full flexion with no effusion in the knee.  Some callus formation is noted around the fracture site.  Continue weightbearing as tolerated one-time prescription for oxycodone written which will not be refilled.  Follow-up in 6 weeks for final x-rays and final check.  Follow-Up Instructions: No follow-ups on file.   Orders:  Orders Placed This Encounter  Procedures  . XR FEMUR MIN 2 VIEWS LEFT   No orders of the defined types were placed in this encounter.   Imaging: Xr Femur Min 2 Views Left  Result Date: 12/03/2018 AP lateral left femur reviewed.  Nail transfixes distal femur fracture.  Some callus formation is noted anteriorly at the fracture site.  No hardware complication.   PMFS History: Patient Active Problem List   Diagnosis Date Noted  . Closed fracture of left femur ([place]) 10/28/2018  . Hypertension associated with diabetes ([place]) 10/28/2018  . Sprain of left ankle   . Emphysematous cystitis 08/26/2018  . Vaginosis 08/26/2018  . Hypokalemia 08/26/2018  . Normocytic anemia 08/26/2018  . Closed fracture of right ankle 11/06/2017  . IRREGULAR MENSES 09/14/2009  . BURN, FIRST DEGREE, ARM 09/14/2009  . HYPERCHOLESTEROLEMIA 11/19/2008  . PARONYCHIA, RIGHT GREAT TOE 07/30/2008  . ABSCESS, TOOTH 03/31/2008  . Uncontrolled diabetes mellitus with  hyperglycemia ([place]) 07/02/2007  . CATARACTS, BILATERAL 07/02/2007   Past Medical History:  Diagnosis Date  . Cataracts, bilateral    surgery to remove  . Diabetes mellitus    type 2  . GERD (gastroesophageal reflux disease)   . Hypertension   . SVD (spontaneous vaginal delivery)    x 4    Family History  Problem Relation Age of Onset  . Diabetes Mother   . Hypertension Mother   . Bipolar disorder Mother   . Brain cancer Maternal Aunt   . Heart disease Maternal Grandmother   . Diabetes Maternal Grandmother   . Breast cancer Maternal Aunt     Past Surgical History:  Procedure Laterality Date  . EYE SURGERY Bilateral    cataracts removed  . FEMUR IM NAIL Left 10/28/2018   Procedure: RETROGRADE FEMORAL NAILING;  Surgeon: Meredith Pel, MD;  [facility];  Service: Orthopedics;  Laterality: Left;  . IM NAILING FEMORAL SHAFT RETROGRADE Left 10/28/2018  . KNEE SURGERY    . TUBAL LIGATION     Social History   Occupational History  . Not on file  Tobacco Use  . Smoking status: Former Smoker    Types: Cigarettes    Last attempt to quit: 1997    Years since quitting: 23.4  . Smokeless tobacco: Never Used  Substance and Sexual Activity  . Alcohol use: No  . Drug use: No  .  Sexual activity: Yes    Birth control/protection: Surgical

## 2018-12-07 ENCOUNTER — Telehealth: Payer: Self-pay

## 2018-12-07 NOTE — Telephone Encounter (Signed)
Pt notified re: potential covid exposure during office visit at [facility]. Pt reports being asymptomatic. Pt declines covid testing and declined call back number if s/sx develop.

## 2018-12-30 ENCOUNTER — Telehealth: Payer: Self-pay | Admitting: Physician Assistant

## 2019-01-19 ENCOUNTER — Ambulatory Visit (INDEPENDENT_AMBULATORY_CARE_PROVIDER_SITE_OTHER): Payer: Self-pay | Admitting: Orthopedic Surgery

## 2019-01-19 ENCOUNTER — Other Ambulatory Visit: Payer: Self-pay

## 2019-01-19 ENCOUNTER — Ambulatory Visit (INDEPENDENT_AMBULATORY_CARE_PROVIDER_SITE_OTHER): Payer: Self-pay

## 2019-01-19 ENCOUNTER — Encounter: Payer: Self-pay | Admitting: Orthopedic Surgery

## 2019-01-19 DIAGNOSIS — S72402D Unspecified fracture of lower end of left femur, subsequent encounter for closed fracture with routine healing: Secondary | ICD-10-CM

## 2019-01-19 NOTE — Progress Notes (Signed)
Office Visit Note   Patient: Melanie Hall           Date of Birth: 20-Feb-1971           MRN: [id number] Visit Date: 01/19/2019 Requested by: No referring provider defined for this encounter. PCP: Patient, No Pcp Per  Subjective: Chief Complaint  Patient presents with  . Post-op Follow-up    HPI: Pt is a 48 y.o. Female who presents to the clinic s/p L femoral nailing on 10/28/18.  Pt reports that her pain is improved since last visit.  She is ambulating with a walker and feels that she is able to ambulate without a walker when she has another person close-by.  She has full extension of her L knee and flexes her L knee to about 90 degrees.  She has not had a home health appointment for weeks.               ROS: All systems reviewed are negative as they relate to the chief complaint within the history of present illness.  Patient denies  fevers or chills.   Assessment & Plan: Visit Diagnoses:  1. Closed fracture of distal end of left femur with routine healing, unspecified fracture morphology, subsequent encounter     Plan: Pt is ~12 weeks s/p L femoral nailing.  Incisions have healed well and she continues to make improvements regarding her pain with ambulation and strength.  She needs to work on getting some more flexion ROM.  She is out of home health therapy visits that are covered by her insurance but I am confident that she will be able to continue to gain motion on her own.  Recommended patient work on straight leg raises to gain quad strength, which will improve her ambulation.  Pt agrees with plan and will followup prn.    Follow-Up Instructions: Return if symptoms worsen or fail to improve.   Orders:  Orders Placed This Encounter  Procedures  . XR FEMUR MIN 2 VIEWS LEFT   No orders of the defined types were placed in this encounter.     Procedures: No procedures performed   Clinical Data: No additional findings.  Objective: Vital Signs: LMP 12/17/2016    Physical Exam:   Constitutional: Patient appears well-developed HEENT:  Head: Normocephalic Eyes:EOM are normal Neck: Normal range of motion Cardiovascular: Normal rate Pulmonary/chest: Effort normal Neurologic: Patient is alert Skin: Skin is warm Psychiatric: Patient has normal mood and affect    Ortho Exam:  L knee exam No effusion Extensor mechanism intact No TTP over the bursas overlying the screws,the patellar tendon, pes anserinus, patella, tibial tubercle, LCL/MCL insertions Mild TTP over medial/lateral jointlines and quad tendon Stable to varus/valgus stresses Extension to 0 degrees Flexion to 95-100 degrees    Specialty Comments:  No specialty comments available.  Imaging: No results found.   PMFS History: Patient Active Problem List   Diagnosis Date Noted  . Closed fracture of left femur ([place]) 10/28/2018  . Hypertension associated with diabetes ([place]) 10/28/2018  . Sprain of left ankle   . Emphysematous cystitis 08/26/2018  . Vaginosis 08/26/2018  . Hypokalemia 08/26/2018  . Normocytic anemia 08/26/2018  . Closed fracture of right ankle 11/06/2017  . IRREGULAR MENSES 09/14/2009  . BURN, FIRST DEGREE, ARM 09/14/2009  . HYPERCHOLESTEROLEMIA 11/19/2008  . PARONYCHIA, RIGHT GREAT TOE 07/30/2008  . ABSCESS, TOOTH 03/31/2008  . Uncontrolled diabetes mellitus with hyperglycemia ([place]) 07/02/2007  . CATARACTS, BILATERAL 07/02/2007   Past Medical History:  Diagnosis Date  . Cataracts, bilateral    surgery to remove  . Diabetes mellitus    type 2  . GERD (gastroesophageal reflux disease)   . Hypertension   . SVD (spontaneous vaginal delivery)    x 4    Family History  Problem Relation Age of Onset  . Diabetes Mother   . Hypertension Mother   . Bipolar disorder Mother   . Brain cancer Maternal Aunt   . Heart disease Maternal Grandmother   . Diabetes Maternal Grandmother   . Breast cancer Maternal Aunt     Past Surgical History:  Procedure  Laterality Date  . EYE SURGERY Bilateral    cataracts removed  . FEMUR IM NAIL Left 10/28/2018   Procedure: RETROGRADE FEMORAL NAILING;  Surgeon: Meredith Pel, MD;  Location: [facility];  Service: Orthopedics;  Laterality: Left;  . IM NAILING FEMORAL SHAFT RETROGRADE Left 10/28/2018  . KNEE SURGERY    . TUBAL LIGATION     Social History   Occupational History  . Not on file  Tobacco Use  . Smoking status: Former Smoker    Types: Cigarettes    Quit date: 1997    Years since quitting: 23.5  . Smokeless tobacco: Never Used  Substance and Sexual Activity  . Alcohol use: No  . Drug use: No  . Sexual activity: Yes    Birth control/protection: Surgical

## 2019-01-29 ENCOUNTER — Encounter: Payer: Self-pay | Admitting: Orthopedic Surgery

## 2019-02-27 ENCOUNTER — Observation Stay (HOSPITAL_COMMUNITY)
Admission: EM | Admit: 2019-02-27 | Discharge: 2019-03-01 | Disposition: A | Discharge status: Home / Self Care | Payer: Self-pay | Attending: Emergency Medicine | Admitting: Emergency Medicine

## 2019-02-27 ENCOUNTER — Other Ambulatory Visit: Payer: Self-pay

## 2019-02-27 ENCOUNTER — Encounter (HOSPITAL_COMMUNITY): Payer: Self-pay | Admitting: Emergency Medicine

## 2019-02-27 DIAGNOSIS — Z20828 Contact with and (suspected) exposure to other viral communicable diseases: Secondary | ICD-10-CM | POA: Insufficient documentation

## 2019-02-27 DIAGNOSIS — E872 Acidosis, unspecified: Secondary | ICD-10-CM

## 2019-02-27 DIAGNOSIS — I10 Essential (primary) hypertension: Secondary | ICD-10-CM | POA: Insufficient documentation

## 2019-02-27 DIAGNOSIS — Z7982 Long term (current) use of aspirin: Secondary | ICD-10-CM | POA: Insufficient documentation

## 2019-02-27 DIAGNOSIS — E1140 Type 2 diabetes mellitus with diabetic neuropathy, unspecified: Secondary | ICD-10-CM | POA: Insufficient documentation

## 2019-02-27 DIAGNOSIS — Z794 Long term (current) use of insulin: Secondary | ICD-10-CM | POA: Insufficient documentation

## 2019-02-27 DIAGNOSIS — N179 Acute kidney failure, unspecified: Principal | ICD-10-CM | POA: Insufficient documentation

## 2019-02-27 DIAGNOSIS — E1121 Type 2 diabetes mellitus with diabetic nephropathy: Secondary | ICD-10-CM | POA: Diagnosis present

## 2019-02-27 DIAGNOSIS — E1159 Type 2 diabetes mellitus with other circulatory complications: Secondary | ICD-10-CM | POA: Diagnosis present

## 2019-02-27 DIAGNOSIS — D649 Anemia, unspecified: Secondary | ICD-10-CM

## 2019-02-27 DIAGNOSIS — K219 Gastro-esophageal reflux disease without esophagitis: Secondary | ICD-10-CM | POA: Insufficient documentation

## 2019-02-27 DIAGNOSIS — E7800 Pure hypercholesterolemia, unspecified: Secondary | ICD-10-CM | POA: Insufficient documentation

## 2019-02-27 DIAGNOSIS — R197 Diarrhea, unspecified: Secondary | ICD-10-CM | POA: Insufficient documentation

## 2019-02-27 LAB — CBC WITH DIFFERENTIAL/PLATELET
Abs Immature Granulocytes: 0.04 10*3/uL (ref 0.00–0.07)
Basophils Absolute: 0 10*3/uL (ref 0.0–0.1)
Basophils Relative: 1 %
Eosinophils Absolute: 0.2 10*3/uL (ref 0.0–0.5)
Eosinophils Relative: 3 %
HCT: 27.4 % — ABNORMAL LOW (ref 36.0–46.0)
Hemoglobin: 8.9 g/dL — ABNORMAL LOW (ref 12.0–15.0)
Immature Granulocytes: 1 %
Lymphocytes Relative: 20 %
Lymphs Abs: 1.2 10*3/uL (ref 0.7–4.0)
MCH: 29.8 pg (ref 26.0–34.0)
MCHC: 32.5 g/dL (ref 30.0–36.0)
MCV: 91.6 fL (ref 80.0–100.0)
Monocytes Absolute: 0.5 10*3/uL (ref 0.1–1.0)
Monocytes Relative: 9 %
Neutro Abs: 4 10*3/uL (ref 1.7–7.7)
Neutrophils Relative %: 66 %
Platelets: 204 10*3/uL (ref 150–400)
RBC: 2.99 MIL/uL — ABNORMAL LOW (ref 3.87–5.11)
RDW: 13.1 % (ref 11.5–15.5)
WBC: 6 10*3/uL (ref 4.0–10.5)
nRBC: 0 % (ref 0.0–0.2)

## 2019-02-27 LAB — COMPREHENSIVE METABOLIC PANEL
ALT: 16 U/L (ref 0–44)
AST: 15 U/L (ref 15–41)
Albumin: 2.2 g/dL — ABNORMAL LOW (ref 3.5–5.0)
Alkaline Phosphatase: 123 U/L (ref 38–126)
Anion gap: 7 (ref 5–15)
BUN: 35 mg/dL — ABNORMAL HIGH (ref 6–20)
CO2: 18 mmol/L — ABNORMAL LOW (ref 22–32)
Calcium: 8.1 mg/dL — ABNORMAL LOW (ref 8.9–10.3)
Chloride: 109 mmol/L (ref 98–111)
Creatinine, Ser: 2.13 mg/dL — ABNORMAL HIGH (ref 0.44–1.00)
GFR calc Af Amer: 31 mL/min — ABNORMAL LOW (ref >60)
GFR calc non Af Amer: 26 mL/min — ABNORMAL LOW (ref >60)
Glucose, Bld: 123 mg/dL — ABNORMAL HIGH (ref 70–99)
Potassium: 3.5 mmol/L (ref 3.5–5.1)
Sodium: 134 mmol/L — ABNORMAL LOW (ref 135–145)
Total Bilirubin: 0.6 mg/dL (ref 0.3–1.2)
Total Protein: 5.7 g/dL — ABNORMAL LOW (ref 6.5–8.1)

## 2019-02-27 LAB — CBG MONITORING, ED
Glucose-Capillary: 122 mg/dL — ABNORMAL HIGH (ref 70–99)
Glucose-Capillary: 130 mg/dL — ABNORMAL HIGH (ref 70–99)

## 2019-02-27 MED ORDER — SODIUM CHLORIDE 0.9 % IV BOLUS
1000.0000 mL | Freq: Once | INTRAVENOUS | Status: AC
  Administered 2019-02-28: 1000 mL via INTRAVENOUS

## 2019-02-27 NOTE — ED Notes (Signed)
Rechecked patient's CBG 130 at this time. Patient given sandwich and chips to eat. Patient refusing to eat. Patient very sleepy and lethargic at this time. Patient is arousalable with sternal rub, but drifts off back to sleep. Reminded patient a urine specimen is needed as well.

## 2019-02-27 NOTE — ED Triage Notes (Signed)
Pt arrives via [facility] for low blood sugar. Family told EMS that family hasn't been eating good but still taking her insulin.

## 2019-02-27 NOTE — ED Provider Notes (Addendum)
[facility] EMERGENCY DEPARTMENT Provider Note   CSN: [id number] Arrival date & time: 02/27/19  2138     History   Chief Complaint Chief Complaint  Patient presents with  . Hypoglycemia    HPI Melanie Hall is a 48 y.o. female.     The history is provided by the patient. No language interpreter was used.  Hypoglycemia Initial blood sugar:  42 Blood sugar after intervention:  122 Severity:  Moderate Onset quality:  Gradual Timing:  Constant Progression:  Worsening Context: decreased oral intake   Relieved by:  Nothing Associated symptoms: weakness   Associated symptoms: no altered mental status   Risk factors: no alcohol abuse   Pt reports she recenlty has had diarrhea.  Pt reports she has only ate an apple today.  Pt is on insulin.  EMS reported pt's glucose dropped.  EMS gave D10 150 mls. Pt has high blood pressure.  Pt reports she did take her medication today.  Past Medical History:  Diagnosis Date  . Cataracts, bilateral    surgery to remove  . Diabetes mellitus    type 2  . GERD (gastroesophageal reflux disease)   . Hypertension   . SVD (spontaneous vaginal delivery)    x 4    Patient Active Problem List   Diagnosis Date Noted  . Closed fracture of left femur ([place]) 10/28/2018  . Hypertension associated with diabetes ([place]) 10/28/2018  . Sprain of left ankle   . Emphysematous cystitis 08/26/2018  . Vaginosis 08/26/2018  . Hypokalemia 08/26/2018  . Normocytic anemia 08/26/2018  . Closed fracture of right ankle 11/06/2017  . IRREGULAR MENSES 09/14/2009  . BURN, FIRST DEGREE, ARM 09/14/2009  . HYPERCHOLESTEROLEMIA 11/19/2008  . PARONYCHIA, RIGHT GREAT TOE 07/30/2008  . ABSCESS, TOOTH 03/31/2008  . Uncontrolled diabetes mellitus with hyperglycemia ([place]) 07/02/2007  . CATARACTS, BILATERAL 07/02/2007    Past Surgical History:  Procedure Laterality Date  . EYE SURGERY Bilateral    cataracts removed  . FEMUR IM NAIL Left 10/28/2018   Procedure:  RETROGRADE FEMORAL NAILING;  Surgeon: Meredith Pel, MD;  Location: [facility];  Service: Orthopedics;  Laterality: Left;  . IM NAILING FEMORAL SHAFT RETROGRADE Left 10/28/2018  . KNEE SURGERY    . TUBAL LIGATION       OB History   No obstetric history on file.      Home Medications    Prior to Admission medications   Medication Sig Start Date End Date Taking? Authorizing Provider  acetaminophen (TYLENOL) 325 MG tablet Take 2 tablets (650 mg total) by mouth every 6 (six) hours as needed for mild pain or headache (or Fever >/= 101). 08/28/18   Barton Dubois, MD  aspirin EC 81 MG EC tablet Take 1 tablet (81 mg total) by mouth 2 (two) times daily. 11/03/18   Meredith Pel, MD  blood glucose meter kit and supplies KIT Dispense based on patient and insurance preference. Use to check blood sugar three times daily as directed. (FOR ICD-9 250.00, 250.01). 08/28/18   Barton Dubois, MD  insulin detemir (LEVEMIR) 100 UNIT/ML injection Inject 0.08 mLs (8 Units total) into the skin at bedtime. 11/03/18   Meredith Pel, MD  lisinopril (PRINIVIL,ZESTRIL) 5 MG tablet Take 1 tablet (5 mg total) by mouth daily. 08/28/18 12/26/18  Barton Dubois, MD  metFORMIN (GLUCOPHAGE) 500 MG tablet Take 1 tablet (500 mg total) by mouth 2 (two) times daily with a meal. 08/28/18 12/26/18  Barton Dubois, MD  methocarbamol (ROBAXIN)  500 MG tablet Take 1 tablet (500 mg total) by mouth every 6 (six) hours as needed for muscle spasms. 11/03/18   Meredith Pel, MD  methocarbamol (ROBAXIN) 500 MG tablet Take 1 tablet (500 mg total) by mouth every 8 (eight) hours as needed for muscle spasms. 11/10/18   Meredith Pel, MD  oxyCODONE (OXY IR/ROXICODONE) 5 MG immediate release tablet Take 1 tablet (5 mg total) by mouth every 4 (four) hours as needed (17m for mild pain, 165mfor moderate pain, 1525mor severe pain). Patient not taking: Reported on 12/03/2018 11/03/18   DeaMeredith PelD  oxyCODONE (ROXICODONE) 5 MG  immediate release tablet Take 1 tablet (5 mg total) by mouth every 12 (twelve) hours as needed for severe pain. 12/03/18   DeaMeredith PelD  pantoprazole (PROTONIX) 40 MG tablet Take 1 tablet (40 mg total) by mouth daily. 08/28/18 08/28/19  MadBarton DuboisD    Family History Family History  Problem Relation Age of Onset  . Diabetes Mother   . Hypertension Mother   . Bipolar disorder Mother   . Brain cancer Maternal Aunt   . Heart disease Maternal Grandmother   . Diabetes Maternal Grandmother   . Breast cancer Maternal Aunt     Social History Social History   Tobacco Use  . Smoking status: Former Smoker    Types: Cigarettes    Quit date: 1997    Years since quitting: 23.6  . Smokeless tobacco: Never Used  Substance Use Topics  . Alcohol use: No  . Drug use: No     Allergies   Patient has no known allergies.   Review of Systems Review of Systems  Neurological: Positive for weakness.  All other systems reviewed and are negative.    Physical Exam Updated Vital Signs BP (!) 167/97 (BP Location: Right Arm)   Pulse 85   Temp 98.9 F (37.2 C) (Oral)   Resp 17   Ht '5\' 2"'  (1.575 m)   Wt 68.9 kg   LMP 12/17/2016   SpO2 100%   BMI 27.80 kg/m   Physical Exam Vitals signs and nursing note reviewed.  Constitutional:      Appearance: She is well-developed.  HENT:     Head: Normocephalic.     Nose: Nose normal.     Mouth/Throat:     Mouth: Mucous membranes are moist.  Neck:     Musculoskeletal: Normal range of motion.  Cardiovascular:     Rate and Rhythm: Normal rate.  Pulmonary:     Effort: Pulmonary effort is normal.  Abdominal:     General: Abdomen is flat. There is no distension.  Musculoskeletal: Normal range of motion.  Skin:    General: Skin is warm.  Neurological:     General: No focal deficit present.     Mental Status: She is alert and oriented to person, place, and time.  Psychiatric:        Mood and Affect: Mood normal.      ED  Treatments / Results  Labs (all labs ordered are listed, but only abnormal results are displayed) Labs Reviewed  CBG MONITORING, ED - Abnormal; Notable for the following components:      Result Value   Glucose-Capillary 122 (*)    All other components within normal limits  CBC WITH DIFFERENTIAL/PLATELET  COMPREHENSIVE METABOLIC PANEL  URINALYSIS, ROUTINE W REFLEX MICROSCOPIC    EKG None  Radiology No results found.  Procedures Procedures (including critical care time)  Medications Ordered in ED Medications - No data to display   Initial Impression / Assessment and Plan / ED Course  I have reviewed the triage vital signs and the nursing notes.  Pertinent labs & imaging results that were available during my care of the patient were reviewed by me and considered in my medical decision making (see chart for details).        Pt given Iv fluids,  Pt encouraged to drink and eat.  Pt's care turned over to Dr. Roxanne Mins,  Ua and chest xray pending  Final Clinical Impressions(s) / ED Diagnoses   Final diagnoses:  Acute kidney injury (nontraumatic) (HCC)  Normochromic normocytic anemia  Metabolic acidosis  Diarrhea, unspecified type    ED Discharge Orders    None       Sidney Ace 21/11/73 [id number]    Delora Fuel, MD 14/10/30 Pecola Lawless    Fransico Meadow, PA-C 03/08/19 2102    Francine Graven, DO 03/09/19 [phone]

## 2019-02-28 ENCOUNTER — Other Ambulatory Visit: Payer: Self-pay

## 2019-02-28 ENCOUNTER — Emergency Department (HOSPITAL_COMMUNITY): Payer: Self-pay

## 2019-02-28 ENCOUNTER — Encounter (HOSPITAL_COMMUNITY): Payer: Self-pay | Admitting: Internal Medicine

## 2019-02-28 DIAGNOSIS — R110 Nausea: Secondary | ICD-10-CM

## 2019-02-28 DIAGNOSIS — N179 Acute kidney failure, unspecified: Secondary | ICD-10-CM

## 2019-02-28 DIAGNOSIS — E7800 Pure hypercholesterolemia, unspecified: Secondary | ICD-10-CM

## 2019-02-28 DIAGNOSIS — K219 Gastro-esophageal reflux disease without esophagitis: Secondary | ICD-10-CM | POA: Diagnosis present

## 2019-02-28 DIAGNOSIS — E1121 Type 2 diabetes mellitus with diabetic nephropathy: Secondary | ICD-10-CM

## 2019-02-28 DIAGNOSIS — N183 Chronic kidney disease, stage 3 (moderate): Secondary | ICD-10-CM

## 2019-02-28 DIAGNOSIS — G629 Polyneuropathy, unspecified: Secondary | ICD-10-CM

## 2019-02-28 HISTORY — DX: Acute kidney failure, unspecified: N17.9

## 2019-02-28 LAB — VITAMIN B12: Vitamin B-12: 437 pg/mL (ref 180–914)

## 2019-02-28 LAB — CBC
HCT: 28.2 % — ABNORMAL LOW (ref 36.0–46.0)
Hemoglobin: 9.2 g/dL — ABNORMAL LOW (ref 12.0–15.0)
MCH: 30.4 pg (ref 26.0–34.0)
MCHC: 32.6 g/dL (ref 30.0–36.0)
MCV: 93.1 fL (ref 80.0–100.0)
Platelets: 214 10*3/uL (ref 150–400)
RBC: 3.03 MIL/uL — ABNORMAL LOW (ref 3.87–5.11)
RDW: 13 % (ref 11.5–15.5)
WBC: 6.8 10*3/uL (ref 4.0–10.5)
nRBC: 0 % (ref 0.0–0.2)

## 2019-02-28 LAB — SARS CORONAVIRUS 2 BY RT PCR (HOSPITAL ORDER, PERFORMED IN ~~LOC~~ HOSPITAL LAB): SARS Coronavirus 2: NEGATIVE

## 2019-02-28 LAB — URINALYSIS, ROUTINE W REFLEX MICROSCOPIC
Bilirubin Urine: NEGATIVE
Glucose, UA: NEGATIVE mg/dL
Hgb urine dipstick: NEGATIVE
Ketones, ur: NEGATIVE mg/dL
Leukocytes,Ua: NEGATIVE
Nitrite: NEGATIVE
Protein, ur: >=300 mg/dL — AB
Specific Gravity, Urine: 1.009 (ref 1.005–1.030)
pH: 8 (ref 5.0–8.0)

## 2019-02-28 LAB — PHOSPHORUS: Phosphorus: 4.5 mg/dL (ref 2.5–4.6)

## 2019-02-28 LAB — GLUCOSE, CAPILLARY
Glucose-Capillary: 118 mg/dL — ABNORMAL HIGH (ref 70–99)
Glucose-Capillary: 156 mg/dL — ABNORMAL HIGH (ref 70–99)
Glucose-Capillary: 165 mg/dL — ABNORMAL HIGH (ref 70–99)

## 2019-02-28 LAB — MAGNESIUM: Magnesium: 1.8 mg/dL (ref 1.7–2.4)

## 2019-02-28 LAB — TSH: TSH: 4.891 u[IU]/mL — ABNORMAL HIGH (ref 0.350–4.500)

## 2019-02-28 LAB — CBG MONITORING, ED: Glucose-Capillary: 137 mg/dL — ABNORMAL HIGH (ref 70–99)

## 2019-02-28 LAB — CREATININE, SERUM
Creatinine, Ser: 2.1 mg/dL — ABNORMAL HIGH (ref 0.44–1.00)
GFR calc Af Amer: 31 mL/min — ABNORMAL LOW (ref >60)
GFR calc non Af Amer: 27 mL/min — ABNORMAL LOW (ref >60)

## 2019-02-28 LAB — HEMOGLOBIN A1C
Hgb A1c MFr Bld: 8.5 % — ABNORMAL HIGH (ref 4.8–5.6)
Mean Plasma Glucose: 197.25 mg/dL

## 2019-02-28 IMAGING — CR PORTABLE CHEST - 1 VIEW
1 series · 1 of 1 positions shown · non-contrast
Comparison: [DATE]

CLINICAL DATA: Weakness

EXAM:
PORTABLE CHEST 1 VIEW

[portable]
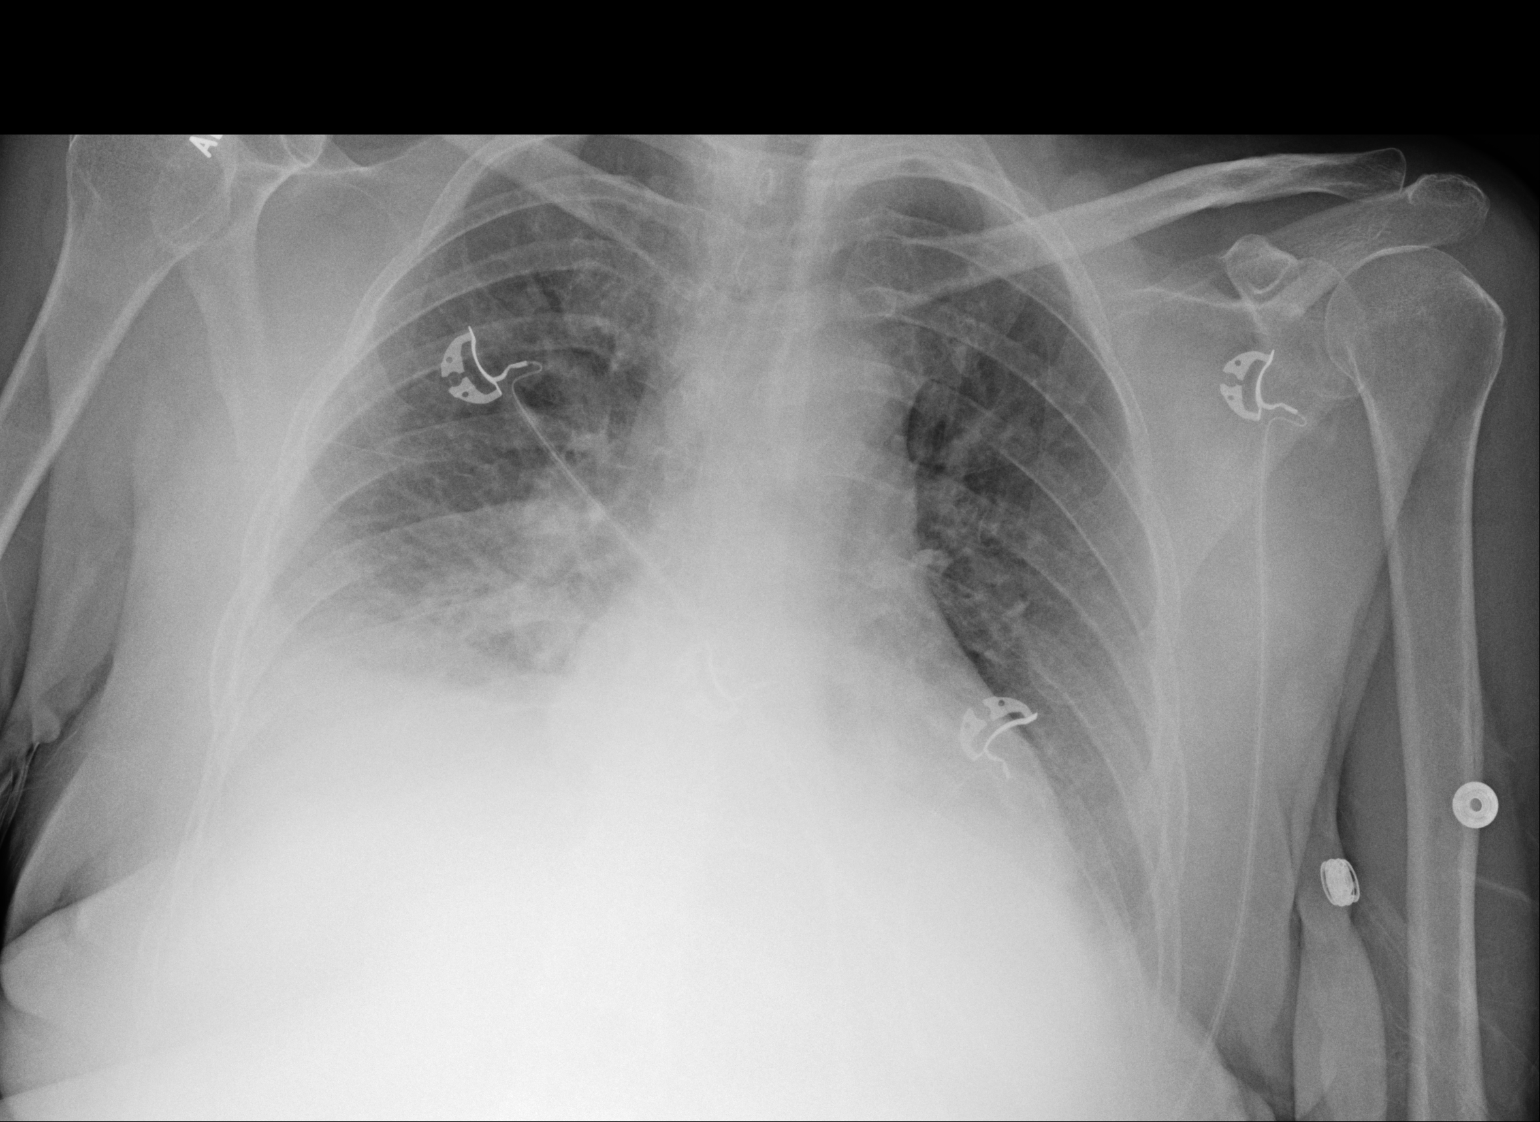

[1 of 1 positions shown; findings below may reference images not displayed]

FINDINGS: The heart size is enlarged. There are bilateral pleural effusions,
right greater than left. There is a right lower lobe airspace
opacity. No pneumothorax. No acute osseous abnormality. There is
generalized volume overload.
IMPRESSION: 1. Small to moderate-sized bilateral pleural effusions, right
greater than left.
2. Right lower lobe airspace opacity of unknown clinical
significance. This could represent atelectasis or infiltrate.

## 2019-02-28 MED ORDER — HYDRALAZINE HCL 20 MG/ML IJ SOLN: 10.0000 mg | Freq: Three times a day (TID) | INTRAMUSCULAR | Status: DC | PRN

## 2019-02-28 MED ORDER — HEPARIN SODIUM (PORCINE) 5000 UNIT/ML IJ SOLN
5000.0000 [IU] | Freq: Three times a day (TID) | INTRAMUSCULAR | Status: DC
  Administered 2019-02-28 – 2019-03-01 (×3): 5000 [IU] via SUBCUTANEOUS
  Filled 2019-02-28 (×3): qty 1

## 2019-02-28 MED ORDER — PANTOPRAZOLE SODIUM 40 MG PO TBEC
40.0000 mg | DELAYED_RELEASE_TABLET | Freq: Every day | ORAL | Status: DC
  Administered 2019-02-28 – 2019-03-01 (×2): 40 mg via ORAL
  Filled 2019-02-28 (×2): qty 1

## 2019-02-28 MED ORDER — ENSURE ENLIVE PO LIQD: 237.0000 mL | Freq: Two times a day (BID) | ORAL | Status: DC

## 2019-02-28 MED ORDER — ONDANSETRON HCL 4 MG PO TABS: 4.0000 mg | ORAL_TABLET | Freq: Four times a day (QID) | ORAL | Status: DC | PRN

## 2019-02-28 MED ORDER — ASPIRIN EC 81 MG PO TBEC
81.0000 mg | DELAYED_RELEASE_TABLET | Freq: Two times a day (BID) | ORAL | Status: DC
  Administered 2019-02-28 – 2019-03-01 (×3): 81 mg via ORAL
  Filled 2019-02-28 (×3): qty 1

## 2019-02-28 MED ORDER — PRAVASTATIN SODIUM 10 MG PO TABS: 20.0000 mg | ORAL_TABLET | Freq: Every day | ORAL | Status: DC

## 2019-02-28 MED ORDER — ACETAMINOPHEN 650 MG RE SUPP: 650.0000 mg | Freq: Four times a day (QID) | RECTAL | Status: DC | PRN

## 2019-02-28 MED ORDER — GABAPENTIN 100 MG PO CAPS
100.0000 mg | ORAL_CAPSULE | Freq: Two times a day (BID) | ORAL | Status: DC
  Administered 2019-02-28 – 2019-03-01 (×2): 100 mg via ORAL
  Filled 2019-02-28 (×2): qty 1

## 2019-02-28 MED ORDER — METHOCARBAMOL 500 MG PO TABS: 500.0000 mg | ORAL_TABLET | Freq: Three times a day (TID) | ORAL | Status: DC | PRN

## 2019-02-28 MED ORDER — INSULIN DETEMIR 100 UNIT/ML ~~LOC~~ SOLN
5.0000 [IU] | Freq: Every day | SUBCUTANEOUS | Status: DC
  Administered 2019-02-28: 5 [IU] via SUBCUTANEOUS
  Filled 2019-02-28 (×2): qty 0.05

## 2019-02-28 MED ORDER — INSULIN ASPART 100 UNIT/ML ~~LOC~~ SOLN
0.0000 [IU] | Freq: Three times a day (TID) | SUBCUTANEOUS | Status: DC
  Administered 2019-02-28: 2 [IU] via SUBCUTANEOUS
  Administered 2019-03-01: 3 [IU] via SUBCUTANEOUS
  Administered 2019-03-01: 1 [IU] via SUBCUTANEOUS

## 2019-02-28 MED ORDER — ACETAMINOPHEN 325 MG PO TABS: 650.0000 mg | ORAL_TABLET | Freq: Four times a day (QID) | ORAL | Status: DC | PRN

## 2019-02-28 MED ORDER — SODIUM CHLORIDE 0.9 % IV SOLN
INTRAVENOUS | Status: DC
  Administered 2019-02-28: 11:00:00 via INTRAVENOUS

## 2019-02-28 MED ORDER — ONDANSETRON HCL 4 MG/2ML IJ SOLN: 4.0000 mg | Freq: Four times a day (QID) | INTRAMUSCULAR | Status: DC | PRN

## 2019-02-28 NOTE — ED Notes (Signed)
ED TO INPATIENT HANDOFF REPORT  ED Nurse Name and Phone #: [phone]  S Name/Age/Gender Melanie Hall 48 y.o. female Room/Bed: APA10/APA10  Code Status   Code Status: Full Code  Home/SNF/Other Home Patient oriented to: self Is this baseline? Yes   Triage Complete: Triage complete  Chief Complaint Hypotension  Triage Note Pt arrives via [facility] for low blood sugar. Family told EMS that family hasn't been eating good but still taking her insulin.    Allergies No Known Allergies  Level of Care/Admitting Diagnosis ED Disposition    ED Disposition Condition [facility] [[id number]]  Level of Care: Med-Surg [16]  Covid Evaluation: Asymptomatic Screening Protocol (No Symptoms)  Diagnosis: AKI (acute kidney injury) [facility]) [865784]  Admitting Physician: Oswald Hillock [[id number]]  Attending Physician: Oswald Hillock [[id number]]  PT Class (Do Not Modify): Observation [104]  PT Acc Code (Do Not Modify): Observation [10022]       B Medical/Surgery History Past Medical History:  Diagnosis Date  . Cataracts, bilateral    surgery to remove  . Diabetes mellitus    type 2  . GERD (gastroesophageal reflux disease)   . Hypertension   . SVD (spontaneous vaginal delivery)    x 4   Past Surgical History:  Procedure Laterality Date  . EYE SURGERY Bilateral    cataracts removed  . FEMUR IM NAIL Left 10/28/2018   Procedure: RETROGRADE FEMORAL NAILING;  Surgeon: Meredith Pel, MD;  Location: [place];  Service: Orthopedics;  Laterality: Left;  . IM NAILING FEMORAL SHAFT RETROGRADE Left 10/28/2018  . KNEE SURGERY    . TUBAL LIGATION       A IV Location/Drains/Wounds Patient Lines/Drains/Airways Status   Active Line/Drains/Airways    Name:   Placement date:   Placement time:   Site:   Days:   Peripheral IV 02/27/19 Left Antecubital   02/27/19    2139    Antecubital   1   Incision (Closed) 10/28/18 Leg Left   10/28/18    1822     123           Intake/Output Last 24 hours No intake or output data in the 24 hours ending 02/28/19 1111  Labs/Imaging Results for orders placed or performed during the hospital encounter of 02/27/19 (from the past 48 hour(s))  Urinalysis, Routine w reflex microscopic     Status: Abnormal   Collection Time: 02/27/19  4:25 AM  Result Value Ref Range   Color, Urine YELLOW YELLOW   APPearance CLOUDY (A) CLEAR   Specific Gravity, Urine 1.009 1.005 - 1.030   pH 8.0 5.0 - 8.0   Glucose, UA NEGATIVE NEGATIVE mg/dL   Hgb urine dipstick NEGATIVE NEGATIVE   Bilirubin Urine NEGATIVE NEGATIVE   Ketones, ur NEGATIVE NEGATIVE mg/dL   Protein, ur >=300 (A) NEGATIVE mg/dL   Nitrite NEGATIVE NEGATIVE   Leukocytes,Ua NEGATIVE NEGATIVE   RBC / HPF 0-5 0 - 5 RBC/hpf   WBC, UA 0-5 0 - 5 WBC/hpf   Bacteria, UA FEW (A) NONE SEEN    Comment: Performed at [facility], [address] [place], [place] [address]  CBG monitoring, ED     Status: Abnormal   Collection Time: 02/27/19  9:59 PM  Result Value Ref Range   Glucose-Capillary 122 (H) 70 - 99 mg/dL   Comment 1 Notify RN    Comment 2 Document in Chart   CBC with Differential/Platelet  Status: Abnormal   Collection Time: 02/27/19 10:21 PM  Result Value Ref Range   WBC 6.0 4.0 - 10.5 K/uL   RBC 2.99 (L) 3.87 - 5.11 MIL/uL   Hemoglobin 8.9 (L) 12.0 - 15.0 g/dL   HCT 27.4 (L) 36.0 - 46.0 %   MCV 91.6 80.0 - 100.0 fL   MCH 29.8 26.0 - 34.0 pg   MCHC 32.5 30.0 - 36.0 g/dL   RDW 13.1 11.5 - 15.5 %   Platelets 204 150 - 400 K/uL   nRBC 0.0 0.0 - 0.2 %   Neutrophils Relative % 66 %   Neutro Abs 4.0 1.7 - 7.7 K/uL   Lymphocytes Relative 20 %   Lymphs Abs 1.2 0.7 - 4.0 K/uL   Monocytes Relative 9 %   Monocytes Absolute 0.5 0.1 - 1.0 K/uL   Eosinophils Relative 3 %   Eosinophils Absolute 0.2 0.0 - 0.5 K/uL   Basophils Relative 1 %   Basophils Absolute 0.0 0.0 - 0.1 K/uL   Immature Granulocytes 1 %   Abs Immature Granulocytes 0.04 0.00 - 0.07 K/uL     Comment: Performed at [facility], [address], [place], [place] [address]  Comprehensive metabolic panel     Status: Abnormal   Collection Time: 02/27/19 10:21 PM  Result Value Ref Range   Sodium 134 (L) 135 - 145 mmol/L   Potassium 3.5 3.5 - 5.1 mmol/L   Chloride 109 98 - 111 mmol/L   CO2 18 (L) 22 - 32 mmol/L   Glucose, Bld 123 (H) 70 - 99 mg/dL   BUN 35 (H) 6 - 20 mg/dL   Creatinine, Ser 2.13 (H) 0.44 - 1.00 mg/dL   Calcium 8.1 (L) 8.9 - 10.3 mg/dL   Total Protein 5.7 (L) 6.5 - 8.1 g/dL   Albumin 2.2 (L) 3.5 - 5.0 g/dL   AST 15 15 - 41 U/L   ALT 16 0 - 44 U/L   Alkaline Phosphatase 123 38 - 126 U/L   Total Bilirubin 0.6 0.3 - 1.2 mg/dL   GFR calc non Af Amer 26 (L) >60 mL/min   GFR calc Af Amer 31 (L) >60 mL/min   Anion gap 7 5 - 15    Comment: Performed at [facility], [address] [place], [place] [address]  CBG monitoring, ED     Status: Abnormal   Collection Time: 02/27/19 11:24 PM  Result Value Ref Range   Glucose-Capillary 130 (H) 70 - 99 mg/dL  POC CBG, ED     Status: Abnormal   Collection Time: 02/28/19  5:21 AM  Result Value Ref Range   Glucose-Capillary 137 (H) 70 - 99 mg/dL  SARS Coronavirus 2 [facility] order, Performed in [facility] hospital lab) Nasopharyngeal Nasopharyngeal Swab     Status: None   Collection Time: 02/28/19  6:40 AM   Specimen: Nasopharyngeal Swab  Result Value Ref Range   SARS Coronavirus 2 NEGATIVE NEGATIVE    Comment: (NOTE) If result is NEGATIVE SARS-CoV-2 target nucleic acids are NOT DETECTED. The SARS-CoV-2 RNA is generally detectable in upper and lower  respiratory specimens during the acute phase of infection. The lowest  concentration of SARS-CoV-2 viral copies this assay can detect is 250  copies / mL. A negative result does not preclude SARS-CoV-2 infection  and should not be used as the sole basis for treatment or other  patient management decisions.  A negative result may occur with  improper specimen collection /  handling, submission of  specimen other  than nasopharyngeal swab, presence of viral mutation(s) within the  areas targeted by this assay, and inadequate number of viral copies  (<250 copies / mL). A negative result must be combined with clinical  observations, patient history, and epidemiological information. If result is POSITIVE SARS-CoV-2 target nucleic acids are DETECTED. The SARS-CoV-2 RNA is generally detectable in upper and lower  respiratory specimens dur ing the acute phase of infection.  Positive  results are indicative of active infection with SARS-CoV-2.  Clinical  correlation with patient history and other diagnostic information is  necessary to determine patient infection status.  Positive results do  not rule out bacterial infection or co-infection with other viruses. If result is PRESUMPTIVE POSTIVE SARS-CoV-2 nucleic acids MAY BE PRESENT.   A presumptive positive result was obtained on the submitted specimen  and confirmed on repeat testing.  While 2019 novel coronavirus  (SARS-CoV-2) nucleic acids may be present in the submitted sample  additional confirmatory testing may be necessary for epidemiological  and / or clinical management purposes  to differentiate between  SARS-CoV-2 and other Sarbecovirus currently known to infect humans.  If clinically indicated additional testing with an alternate test  methodology [phone]) is advised. The SARS-CoV-2 RNA is generally  detectable in upper and lower respiratory sp ecimens during the acute  phase of infection. The expected result is Negative. Fact Sheet for Patients:  StrictlyIdeas.no Fact Sheet for Healthcare Providers: BankingDealers.co.za This test is not yet approved or cleared by the [place] FDA and has been authorized for detection and/or diagnosis of SARS-CoV-2 by FDA under an Emergency Use Authorization (EUA).  This EUA will remain in effect (meaning this  test can be used) for the duration of the COVID-19 declaration under Section 564(b)(1) of the Act, 21 U.S.C. section 360bbb-3(b)(1), unless the authorization is terminated or revoked sooner. Performed at [facility] [address] [place], [place] [address]   CBC     Status: Abnormal   Collection Time: 02/28/19  8:35 AM  Result Value Ref Range   WBC 6.8 4.0 - 10.5 K/uL   RBC 3.03 (L) 3.87 - 5.11 MIL/uL   Hemoglobin 9.2 (L) 12.0 - 15.0 g/dL   HCT 28.2 (L) 36.0 - 46.0 %   MCV 93.1 80.0 - 100.0 fL   MCH 30.4 26.0 - 34.0 pg   MCHC 32.6 30.0 - 36.0 g/dL   RDW 13.0 11.5 - 15.5 %   Platelets 214 150 - 400 K/uL   nRBC 0.0 0.0 - 0.2 %    Comment: Performed at [facility], [address], [place], [place] [address]  Creatinine, serum     Status: Abnormal   Collection Time: 02/28/19  8:35 AM  Result Value Ref Range   Creatinine, Ser 2.10 (H) 0.44 - 1.00 mg/dL   GFR calc non Af Amer 27 (L) >60 mL/min   GFR calc Af Amer 31 (L) >60 mL/min    Comment: Performed at [facility], [address], [place], [place] [address]  Magnesium     Status: None   Collection Time: 02/28/19  8:35 AM  Result Value Ref Range   Magnesium 1.8 1.7 - 2.4 mg/dL    Comment: Performed at [facility], [address], [place], [place] [address]  Phosphorus     Status: None   Collection Time: 02/28/19  8:35 AM  Result Value Ref Range   Phosphorus 4.5 2.5 - 4.6 mg/dL    Comment: Performed at [facility], [place]  [address] [place], [place] [address]  TSH     Status: Abnormal   Collection Time: 02/28/19  8:35 AM  Result Value Ref Range   TSH 4.891 (H) 0.350 - 4.500 uIU/mL    Comment: Performed by a 3rd Generation assay with a functional sensitivity of <=0.01 uIU/mL. Performed at [facility], [address], [place], [place] [address]    Dg Chest Port 1 View  Result Date: 02/28/2019 CLINICAL DATA:  Weakness EXAM: PORTABLE CHEST 1 VIEW COMPARISON:  August 26, 2018 FINDINGS: The heart size is enlarged. There are  bilateral pleural effusions, right greater than left. There is a right lower lobe airspace opacity. No pneumothorax. No acute osseous abnormality. There is generalized volume overload. IMPRESSION: 1. Small to moderate-sized bilateral pleural effusions, right greater than left. 2. Right lower lobe airspace opacity of unknown clinical significance. This could represent atelectasis or infiltrate. Electronically Signed   By: Constance Holster M.D.   On: 02/28/2019 01:17    Pending Labs Unresulted Labs (From admission, onward)    Start     Ordered   03/01/19 0500  Lipid panel  Tomorrow morning,   R     02/28/19 0822   03/01/19 7824  Basic metabolic panel  Tomorrow morning,   R     02/28/19 0822   02/28/19 0927  Vitamin B12  Once,   STAT     02/28/19 0926   02/28/19 0818  Hemoglobin A1c  Once,   STAT     02/28/19 0822          Vitals/Pain Today's Vitals   02/28/19 0530 02/28/19 0600 02/28/19 0630 02/28/19 0700  BP: (!) 193/106 (!) 186/106 (!) 191/100 (!) 186/98  Pulse:      Resp:      Temp:      TempSrc:      SpO2:      Weight:      Height:      PainSc:        Isolation Precautions No active isolations  Medications Medications  aspirin EC tablet 81 mg (81 mg Oral Given 02/28/19 1104)  pantoprazole (PROTONIX) EC tablet 40 mg (40 mg Oral Given 02/28/19 1104)  methocarbamol (ROBAXIN) tablet 500 mg (has no administration in time range)  heparin injection 5,000 Units (has no administration in time range)  0.9 %  sodium chloride infusion ( Intravenous New Bag/Given 02/28/19 1103)  acetaminophen (TYLENOL) tablet 650 mg (has no administration in time range)    Or  acetaminophen (TYLENOL) suppository 650 mg (has no administration in time range)  ondansetron (ZOFRAN) tablet 4 mg (has no administration in time range)    Or  ondansetron (ZOFRAN) injection 4 mg (has no administration in time range)  insulin aspart (novoLOG) injection 0-9 Units (has no administration in time range)   insulin detemir (LEVEMIR) injection 5 Units (has no administration in time range)  sodium chloride 0.9 % bolus 1,000 mL (0 mLs Intravenous Stopped 02/28/19 0156)    Mobility walks Low fall risk   Focused Assessments   R Recommendations: See Admitting Provider Note  Report given to:   Additional Notes:

## 2019-02-28 NOTE — H&P (Signed)
History and Physical    SUI KASPAREK FWY:[id number] DOB: 10-31-1970 DOA: 02/27/2019  Referring MD/NP/PA: Dr. Roxanne Mins PCP: Patient, No Pcp Per  Patient coming from: Home  Chief Complaint: Nausea, hypoglycemia and dehydration  HPI: Melanie Hall is a 48 y.o. female with past medical history significant for hypertension, gastroesophageal reflux disease, type 2 diabetes mellitus with nephropathy and chronic kidney disease stage III; who presented to the emergency department after experiencing severe episode of hypoglycemia.  Patient reports having decreased oral intake during the day secondary to nausea, also diarrhea.  She has present no vomiting, no fever, no chills, no hematemesis, no melena, no hematochezia, no dysuria, no headaches, no focal weakness.  COVID test negative.   In the ED work-up demonstrated dehydration with acute on chronic renal failure, hypoglycemia that responded well to D50, IV fluids and advance of oral intake.  TRH was contacted to place patient in observation for further evaluate CBGs and improvement in her renal function.  Past Medical/Surgical History: Past Medical History:  Diagnosis Date  . Cataracts, bilateral    surgery to remove  . Diabetes mellitus    type 2  . GERD (gastroesophageal reflux disease)   . Hypertension   . SVD (spontaneous vaginal delivery)    x 4    Past Surgical History:  Procedure Laterality Date  . EYE SURGERY Bilateral    cataracts removed  . FEMUR IM NAIL Left 10/28/2018   Procedure: RETROGRADE FEMORAL NAILING;  Surgeon: Meredith Pel, MD;  Location: [facility];  Service: Orthopedics;  Laterality: Left;  . IM NAILING FEMORAL SHAFT RETROGRADE Left 10/28/2018  . KNEE SURGERY    . TUBAL LIGATION      Social History:  reports that she quit smoking about 23 years ago. Her smoking use included cigarettes. She has never used smokeless tobacco. She reports that she does not drink alcohol or use drugs.  Allergies: No Known Allergies  Family History:  Family History  Problem Relation Age of Onset  . Diabetes Mother   . Hypertension Mother   . Bipolar disorder Mother   . Brain cancer Maternal Aunt   . Heart disease Maternal Grandmother   . Diabetes Maternal Grandmother   . Breast cancer Maternal Aunt     Prior to Admission medications   Medication Sig Start Date End Date Taking? Authorizing Provider  acetaminophen (TYLENOL) 325 MG tablet Take 2 tablets (650 mg total) by mouth every 6 (six) hours as needed for mild pain or headache (or Fever >/= 101). 08/28/18   Barton Dubois, MD  aspirin EC 81 MG EC tablet Take 1 tablet (81 mg total) by mouth 2 (two) times daily. 11/03/18   Meredith Pel, MD  blood glucose meter kit and supplies KIT Dispense based on patient and insurance preference. Use to check blood sugar three times daily as directed. (FOR ICD-9 250.00, 250.01). 08/28/18   Barton Dubois, MD  insulin detemir (LEVEMIR) 100 UNIT/ML injection Inject 0.08 mLs (8 Units total) into the skin at bedtime. 11/03/18   Meredith Pel, MD  lisinopril (PRINIVIL,ZESTRIL) 5 MG tablet Take 1 tablet (5 mg total) by mouth daily. 08/28/18 12/26/18  Barton Dubois, MD  metFORMIN (GLUCOPHAGE) 500 MG tablet Take 1 tablet (500 mg total) by mouth 2 (two) times daily with a meal. 08/28/18 12/26/18  Barton Dubois, MD  methocarbamol (ROBAXIN) 500 MG tablet Take 1 tablet (500 mg total) by mouth every 6 (six) hours as needed for muscle spasms. 11/03/18   Marlou Sa,  Tonna Corner, MD  methocarbamol (ROBAXIN) 500 MG tablet Take 1 tablet (500 mg total) by mouth every 8 (eight) hours as needed for muscle spasms. 11/10/18   Meredith Pel, MD  oxyCODONE (OXY IR/ROXICODONE) 5 MG immediate release tablet Take 1 tablet (5 mg total) by mouth every 4 (four) hours as needed (21m for mild pain, 134mfor moderate pain, 1577mor severe pain). Patient not taking: Reported on 12/03/2018 11/03/18   DeaMeredith PelD  oxyCODONE (ROXICODONE) 5 MG immediate  release tablet Take 1 tablet (5 mg total) by mouth every 12 (twelve) hours as needed for severe pain. 12/03/18   DeaMeredith PelD  pantoprazole (PROTONIX) 40 MG tablet Take 1 tablet (40 mg total) by mouth daily. 08/28/18 08/28/19  MadBarton DuboisD    Review of Systems:  Negative except as otherwise mentioned in HPI.    Physical Exam: Vitals:   02/28/19 0530 02/28/19 0600 02/28/19 0630 02/28/19 0700  BP: (!) 193/106 (!) 186/106 (!) 191/100 (!) 186/98  Pulse:      Resp:      Temp:      TempSrc:      SpO2:      Weight:      Height:        Constitutional: NAD, calm, comfortable; reports no chest pain, no shortness of breath, currently afebrile; denies no further diarrhea and is not nauseated.  Eyes: PERRL, lids and conjunctivae normal, no icterus, no nystagmus. ENMT: Mucous membranes are dry on exam. Posterior pharynx clear of any exudate or lesions. Neck: normal, supple, no masses, no thyromegaly, no JVD Respiratory: Clear to auscultation bilaterally, no wheezing, no crackles. Normal respiratory effort. No accessory muscle use.  Cardiovascular: Regular rate and rhythm, no murmurs / rubs / gallops. No extremity edema. 2+ pedal pulses. No carotid bruits.  Abdomen: no tenderness, no masses palpated. No hepatosplenomegaly. Bowel sounds positive.  Musculoskeletal: no clubbing / cyanosis. No joint deformity upper and lower extremities. Good ROM, no contractures. Normal muscle tone.  Skin: no rashes, lesions, ulcers. No induration Neurologic: CN 2-12 grossly intact. Sensation intact, DTR normal. Strength 5/5 in all 4.  Psychiatric: Normal judgment and insight. Alert and oriented x 3. Normal mood.    Labs on Admission: I have personally reviewed the following labs and imaging studies  CBC: Recent Labs  Lab 02/27/19 2221  WBC 6.0  NEUTROABS 4.0  HGB 8.9*  HCT 27.4*  MCV 91.6  PLT [id number] Metabolic Panel: Recent Labs  Lab 02/27/19 2221  NA 134*  K 3.5  CL 109  CO2  18*  GLUCOSE 123*  BUN 35*  CREATININE 2.13*  CALCIUM 8.1*   GFR: Estimated Creatinine Clearance: 29.1 mL/min (A) (by C-G formula based on SCr of 2.13 mg/dL (H)).   Liver Function Tests: Recent Labs  Lab 02/27/19 2221  AST 15  ALT 16  ALKPHOS 123  BILITOT 0.6  PROT 5.7*  ALBUMIN 2.2*   CBG: Recent Labs  Lab 02/27/19 2159 02/27/19 2324 02/28/19 0521  GLUCAP 122* 130* 137*   Urine analysis:    Component Value Date/Time   COLORURINE YELLOW 02/27/2019 0425   APPEARANCEUR CLOUDY (A) 02/27/2019 0425   LABSPEC 1.009 02/27/2019 0425   PHURINE 8.0 02/27/2019 0425   GLUCOSEU NEGATIVE 02/27/2019 0425   HGBUR NEGATIVE 02/27/2019 0425   HGBUR negative 09/14/2009 1428   BILIRUBINUR NEGATIVE 02/27/2019 0425   KETONESUR NEGATIVE 02/27/2019 0425   PROTEINUR >=300 (A) 02/27/2019 0425   UROBILINOGEN  1.0 10/27/2013 2022   NITRITE NEGATIVE 02/27/2019 0425   LEUKOCYTESUR NEGATIVE 02/27/2019 0425    Recent Results (from the past 240 hour(s))  SARS Coronavirus 2 [facility] order, Performed in [facility] hospital lab) Nasopharyngeal Nasopharyngeal Swab     Status: None   Collection Time: 02/28/19  6:40 AM   Specimen: Nasopharyngeal Swab  Result Value Ref Range Status   SARS Coronavirus 2 NEGATIVE NEGATIVE Final    Comment: (NOTE) If result is NEGATIVE SARS-CoV-2 target nucleic acids are NOT DETECTED. The SARS-CoV-2 RNA is generally detectable in upper and lower  respiratory specimens during the acute phase of infection. The lowest  concentration of SARS-CoV-2 viral copies this assay can detect is 250  copies / mL. A negative result does not preclude SARS-CoV-2 infection  and should not be used as the sole basis for treatment or other  patient management decisions.  A negative result may occur with  improper specimen collection / handling, submission of specimen other  than nasopharyngeal swab, presence of viral mutation(s) within the  areas targeted by this assay, and inadequate  number of viral copies  (<250 copies / mL). A negative result must be combined with clinical  observations, patient history, and epidemiological information. If result is POSITIVE SARS-CoV-2 target nucleic acids are DETECTED. The SARS-CoV-2 RNA is generally detectable in upper and lower  respiratory specimens dur ing the acute phase of infection.  Positive  results are indicative of active infection with SARS-CoV-2.  Clinical  correlation with patient history and other diagnostic information is  necessary to determine patient infection status.  Positive results do  not rule out bacterial infection or co-infection with other viruses. If result is PRESUMPTIVE POSTIVE SARS-CoV-2 nucleic acids MAY BE PRESENT.   A presumptive positive result was obtained on the submitted specimen  and confirmed on repeat testing.  While 2019 novel coronavirus  (SARS-CoV-2) nucleic acids may be present in the submitted sample  additional confirmatory testing may be necessary for epidemiological  and / or clinical management purposes  to differentiate between  SARS-CoV-2 and other Sarbecovirus currently known to infect humans.  If clinically indicated additional testing with an alternate test  methodology [phone]) is advised. The SARS-CoV-2 RNA is generally  detectable in upper and lower respiratory sp ecimens during the acute  phase of infection. The expected result is Negative. Fact Sheet for Patients:  StrictlyIdeas.no Fact Sheet for Healthcare Providers: BankingDealers.co.za This test is not yet approved or cleared by the [place] FDA and has been authorized for detection and/or diagnosis of SARS-CoV-2 by FDA under an Emergency Use Authorization (EUA).  This EUA will remain in effect (meaning this test can be used) for the duration of the COVID-19 declaration under Section 564(b)(1) of the Act, 21 U.S.C. section 360bbb-3(b)(1), unless the  authorization is terminated or revoked sooner. Performed at [facility], [address] [place], [place] [address]      Radiological Exams on Admission: Dg Chest Port 1 View  Result Date: 02/28/2019 CLINICAL DATA:  Weakness EXAM: PORTABLE CHEST 1 VIEW COMPARISON:  August 26, 2018 FINDINGS: The heart size is enlarged. There are bilateral pleural effusions, right greater than left. There is a right lower lobe airspace opacity. No pneumothorax. No acute osseous abnormality. There is generalized volume overload. IMPRESSION: 1. Small to moderate-sized bilateral pleural effusions, right greater than left. 2. Right lower lobe airspace opacity of unknown clinical significance. This could represent atelectasis or infiltrate. Electronically Signed   By: Jamie Kato.D.  On: 02/28/2019 01:17    EKG: Independently reviewed.  Normal axis, sinus rhythm, nonspecific T wave abnormalities appreciated.  Assessment/Plan 1-acute on chronic renal failure (HCC) -In the setting of dehydration and continue use of nephrotoxic agents -Avoid nephrotoxic agents -Provide IV fluid resuscitation -Follow renal function trend in a.m. -Urinalysis without signs of acute infection. -Patient denies dysuria or symptoms of urinary retention. -Patient renal function is 1.3 at baseline, with a GFR categorizing her for stage III.  2-Type 2 diabetes with nephropathy ([place]) -Patient presented with hypoglycemia -Educated about skipping meals and maintaining adequate nutrition. -Will use a sliding scale insulin, low-dose Levemir nightly. -Hold oral hypoglycemic agents -Follow CBGs and further adjust insulin therapy as needed.  3-HYPERCHOLESTEROLEMIA -Continue following heart healthy diet -Continue the use of statins -Recommend repeat lipid panel as an outpatient.  4-Hypertension associated with diabetes ([place]) -Will hold lisinopril and HCTZ in the setting of acute on chronic renal failure -Will use PRN hydralazine  -Continue heart healthy diet.  5-GERD (gastroesophageal reflux disease)  -Continue PPI  6-neuropathy -Patient is present with tingling and numbness sensation affecting hands and feet -Check B12 and TSH -Low-dose Neurontin started   DVT prophylaxis: heparin  Code Status: Full Code Family Communication: No family at bedside. Disposition Plan: Anticipate discharge back home once renal function and volume status is stabilized. Consults called: None Admission status: Observation, length of stay less than 2 midnights, med-surg bed.   Time Spent: 60 minutes.  Barton Dubois MD [facility] Pager [phone]   02/28/2019, 8:23 AM

## 2019-02-28 NOTE — ED Notes (Signed)
Patient called to go to restroom, patient was unable to get a urine specimen. Patient had diarrhea after eating sandwich. Reminded patient when she needs restroom a urine specimen is to be obtained.

## 2019-02-28 NOTE — ED Notes (Signed)
Pt refused in and out cath. MD notified.

## 2019-03-01 DIAGNOSIS — E1159 Type 2 diabetes mellitus with other circulatory complications: Secondary | ICD-10-CM

## 2019-03-01 DIAGNOSIS — R197 Diarrhea, unspecified: Secondary | ICD-10-CM

## 2019-03-01 DIAGNOSIS — I10 Essential (primary) hypertension: Secondary | ICD-10-CM

## 2019-03-01 LAB — GLUCOSE, CAPILLARY
Glucose-Capillary: 130 mg/dL — ABNORMAL HIGH (ref 70–99)
Glucose-Capillary: 201 mg/dL — ABNORMAL HIGH (ref 70–99)

## 2019-03-01 LAB — BASIC METABOLIC PANEL
Anion gap: 6 (ref 5–15)
BUN: 30 mg/dL — ABNORMAL HIGH (ref 6–20)
CO2: 19 mmol/L — ABNORMAL LOW (ref 22–32)
Calcium: 7.7 mg/dL — ABNORMAL LOW (ref 8.9–10.3)
Chloride: 112 mmol/L — ABNORMAL HIGH (ref 98–111)
Creatinine, Ser: 2.21 mg/dL — ABNORMAL HIGH (ref 0.44–1.00)
GFR calc Af Amer: 29 mL/min — ABNORMAL LOW (ref >60)
GFR calc non Af Amer: 25 mL/min — ABNORMAL LOW (ref >60)
Glucose, Bld: 135 mg/dL — ABNORMAL HIGH (ref 70–99)
Potassium: 3.5 mmol/L (ref 3.5–5.1)
Sodium: 137 mmol/L (ref 135–145)

## 2019-03-01 LAB — LIPID PANEL
Cholesterol: 164 mg/dL (ref 0–200)
HDL: 32 mg/dL — ABNORMAL LOW (ref >40)
LDL Cholesterol: 90 mg/dL (ref 0–99)
Total CHOL/HDL Ratio: 5.1 RATIO
Triglycerides: 210 mg/dL — ABNORMAL HIGH (ref <150)
VLDL: 42 mg/dL — ABNORMAL HIGH (ref 0–40)

## 2019-03-01 MED ORDER — GABAPENTIN 100 MG PO CAPS: 100.0000 mg | ORAL_CAPSULE | Freq: Two times a day (BID) | ORAL | 1 refills | Status: DC

## 2019-03-01 MED ORDER — SODIUM CHLORIDE 0.9 % IV SOLN: INTRAVENOUS | Status: DC

## 2019-03-01 MED ORDER — AMLODIPINE BESYLATE 5 MG PO TABS: 5.0000 mg | ORAL_TABLET | Freq: Every day | ORAL | 2 refills | Status: DC

## 2019-03-01 NOTE — Discharge Summary (Signed)
Physician Discharge Summary  Melanie Hall PYP:[id number] DOB: [birth year] DOA: 02/27/2019  PCP: Patient, No Pcp Per  Admit date: 02/27/2019 Discharge date: 03/01/2019  Time spent: 30 minutes  Recommendations for Outpatient Follow-up:  1. Reassess blood pressure and adjust antihypertensive regimen as needed 2. Close follow-up the patient CBGs with further adjustment to hypoglycemic regimen as required 3. Repeat basic metabolic panel to follow electrolytes and renal function.   Discharge Diagnoses:  Principal Problem:   Acute on chronic renal failure (HCC) Active Problems:   Type 2 diabetes with nephropathy ([place])   HYPERCHOLESTEROLEMIA   Hypertension associated with diabetes ([place])   AKI (acute kidney injury) ([place])   GERD (gastroesophageal reflux disease)   Diarrhea   Discharge Condition: Stable and in no distress.  Patient has demanded to be discharged home saying that she will follow-up with PCP in 1 week and will follow recommendations to maintain adequate hydration and avoid nephrotoxic agents.  Patient overall asymptomatic at time of discharge tolerating by mouth and with a stable blood sugar levels.  Renal function creatinine 2.2; stable GFR.  Diet recommendation: Heart healthy and modified carbohydrate diet.  Filed Weights   02/27/19 2144 02/28/19 1224  Weight: 68.9 kg 73 kg    History of present illness:  48 y.o. female with past medical history significant for hypertension, gastroesophageal reflux disease, type 2 diabetes mellitus with nephropathy and chronic kidney disease stage III; who presented to the emergency department after experiencing severe episode of hypoglycemia.  Patient reports having decreased oral intake during the day secondary to nausea, also diarrhea.  She has present no vomiting, no fever, no chills, no hematemesis, no melena, no hematochezia, no dysuria, no headaches, no focal weakness.  COVID test negative.   In the ED work-up demonstrated dehydration  with acute on chronic renal failure, hypoglycemia that responded well to D50, IV fluids and advance of oral intake.  TRH was contacted to place patient in observation for further evaluate CBGs and improvement in her renal function.  Hospital Course:  1-acute on chronic renal failure -Appears to be in the setting of mild dehydration and continue use of nephrotoxic agents. -Patient has a baseline creatinine of 1.3-1.5 -GFR categorizing her as a stage III. -After fluid resuscitation was given creatinine remains around 2.2 with a stable GFR. -Patient has demanded to be discharged home, is not having any nausea or vomiting tolerating diet and without any further diarrhea. -Nephrotoxic agents has been discontinued and she has been advised to maintain adequate oral hydration with emphasize importance to be followed by PCP in 1 week. -Repeat basic meta panel at that time to reassess stability of renal function.  2-type 2 diabetes with nephropathy -A1c 8.5 -Given renal failure Metformin has been discontinued -Continue the use of insulin -Advised to maintain adequate hydration and to follow modified carbohydrate diet. -Please reassess CBGs at follow-up visit and further adjust hypoglycemic regimen as needed. -Be aware the patient has an episode of hypoglycemia prior to presented to the emergency department during this hospitalization. -Educated about importance of not skipping meals and maintaining adequate nutrition.  3-gastroesophageal reflux disease -Continue PPI  4-hypertension associated with diabetes -Lisinopril and HCTZ has been discontinued in the setting of renal failure -Patient discharge home on Norvasc -Advised to follow heart healthy diet. -Reassess blood pressure and further adjust antihypertensive regimen as required.  5-hypercholesterolemia -Continue statins.  6-neuropathy -Most likely associated with diabetes -Started on Neurontin 800 mg twice a day with good  response -Normal TSH and normal  B12 level.  Procedures: None  Consultations:  None  Discharge Exam: Vitals:   02/28/19 2109 03/01/19 0535  BP: (!) 173/96 (!) 170/94  Pulse: 94 98  Resp: 18 17  Temp: 98 F (36.7 C) 98.4 F (36.9 C)  SpO2: 96% 96%    General: Afebrile, no chest pain, no shortness of breath, no nausea, no vomiting, no abdominal pain, no dysuria.  Patient tolerating diet without problem and not demonstrating capability to maintain adequate hydration. Cardiovascular: S1 and S2, no rubs, no gallops, no murmurs. Respiratory: Clear to auscultation bilaterally, normal respiratory effort. Abdomen: Soft, nontender, nondistended, positive bowel sounds Extremities: No edema, no cyanosis, no clubbing.  Discharge Instructions   Discharge Instructions    Discharge instructions   Complete by: As directed    Take medications as prescribed Maintain adequate hydration Follow up with PCP in 1 week Follow heart healthy and modified carb diet     Allergies as of 03/01/2019   No Known Allergies     Medication List    STOP taking these medications   hydrochlorothiazide 25 MG tablet Commonly known as: HYDRODIURIL   insulin detemir 100 UNIT/ML injection Commonly known as: LEVEMIR   lisinopril 5 MG tablet Commonly known as: ZESTRIL   metFORMIN 500 MG tablet Commonly known as: Glucophage   methocarbamol 500 MG tablet Commonly known as: ROBAXIN   oxyCODONE 5 MG immediate release tablet Commonly known as: Roxicodone     TAKE these medications   acetaminophen 325 MG tablet Commonly known as: TYLENOL Take 2 tablets (650 mg total) by mouth every 6 (six) hours as needed for mild pain or headache (or Fever >/= 101).   amLODipine 5 MG tablet Commonly known as: NORVASC Take 1 tablet (5 mg total) by mouth daily.   aspirin 81 MG EC tablet Take 1 tablet (81 mg total) by mouth 2 (two) times daily.   blood glucose meter kit and supplies Kit Dispense based on  patient and insurance preference. Use to check blood sugar three times daily as directed. (FOR ICD-9 250.00, 250.01).   gabapentin 100 MG capsule Commonly known as: NEURONTIN Take 1 capsule (100 mg total) by mouth 2 (two) times daily.   insulin glargine 100 UNIT/ML injection Commonly known as: LANTUS Inject 15 Units into the skin at bedtime.   lovastatin 20 MG tablet Commonly known as: MEVACOR Take 40 mg by mouth at bedtime.   pantoprazole 40 MG tablet Commonly known as: Protonix Take 1 tablet (40 mg total) by mouth daily.      No Known Allergies   The results of significant diagnostics from this hospitalization (including imaging, microbiology, ancillary and laboratory) are listed below for reference.    Significant Diagnostic Studies: Dg Chest Port 1 View  Result Date: 02/28/2019 CLINICAL DATA:  Weakness EXAM: PORTABLE CHEST 1 VIEW COMPARISON:  August 26, 2018 FINDINGS: The heart size is enlarged. There are bilateral pleural effusions, right greater than left. There is a right lower lobe airspace opacity. No pneumothorax. No acute osseous abnormality. There is generalized volume overload. IMPRESSION: 1. Small to moderate-sized bilateral pleural effusions, right greater than left. 2. Right lower lobe airspace opacity of unknown clinical significance. This could represent atelectasis or infiltrate. Electronically Signed   By: Constance Holster M.D.   On: 02/28/2019 01:17    Microbiology: Recent Results (from the past 240 hour(s))  SARS Coronavirus 2 [facility] order, Performed in [facility] hospital lab) Nasopharyngeal Nasopharyngeal Swab     Status: None   Collection  Time: 02/28/19  6:40 AM   Specimen: Nasopharyngeal Swab  Result Value Ref Range Status   SARS Coronavirus 2 NEGATIVE NEGATIVE Final    Comment: (NOTE) If result is NEGATIVE SARS-CoV-2 target nucleic acids are NOT DETECTED. The SARS-CoV-2 RNA is generally detectable in upper and lower  respiratory specimens  during the acute phase of infection. The lowest  concentration of SARS-CoV-2 viral copies this assay can detect is 250  copies / mL. A negative result does not preclude SARS-CoV-2 infection  and should not be used as the sole basis for treatment or other  patient management decisions.  A negative result may occur with  improper specimen collection / handling, submission of specimen other  than nasopharyngeal swab, presence of viral mutation(s) within the  areas targeted by this assay, and inadequate number of viral copies  (<250 copies / mL). A negative result must be combined with clinical  observations, patient history, and epidemiological information. If result is POSITIVE SARS-CoV-2 target nucleic acids are DETECTED. The SARS-CoV-2 RNA is generally detectable in upper and lower  respiratory specimens dur ing the acute phase of infection.  Positive  results are indicative of active infection with SARS-CoV-2.  Clinical  correlation with patient history and other diagnostic information is  necessary to determine patient infection status.  Positive results do  not rule out bacterial infection or co-infection with other viruses. If result is PRESUMPTIVE POSTIVE SARS-CoV-2 nucleic acids MAY BE PRESENT.   A presumptive positive result was obtained on the submitted specimen  and confirmed on repeat testing.  While 2019 novel coronavirus  (SARS-CoV-2) nucleic acids may be present in the submitted sample  additional confirmatory testing may be necessary for epidemiological  and / or clinical management purposes  to differentiate between  SARS-CoV-2 and other Sarbecovirus currently known to infect humans.  If clinically indicated additional testing with an alternate test  methodology [phone]) is advised. The SARS-CoV-2 RNA is generally  detectable in upper and lower respiratory sp ecimens during the acute  phase of infection. The expected result is Negative. Fact Sheet for Patients:   StrictlyIdeas.no Fact Sheet for Healthcare Providers: BankingDealers.co.za This test is not yet approved or cleared by the [place] FDA and has been authorized for detection and/or diagnosis of SARS-CoV-2 by FDA under an Emergency Use Authorization (EUA).  This EUA will remain in effect (meaning this test can be used) for the duration of the COVID-19 declaration under Section 564(b)(1) of the Act, 21 U.S.C. section 360bbb-3(b)(1), unless the authorization is terminated or revoked sooner. Performed at [facility], [address], [place], [place] [address]      Labs: Basic Metabolic Panel: Recent Labs  Lab 02/27/19 2221 02/28/19 0835 03/01/19 0710  NA 134*  --  137  K 3.5  --  3.5  CL 109  --  112*  CO2 18*  --  19*  GLUCOSE 123*  --  135*  BUN 35*  --  30*  CREATININE 2.13* 2.10* 2.21*  CALCIUM 8.1*  --  7.7*  MG  --  1.8  --   PHOS  --  4.5  --    Liver Function Tests: Recent Labs  Lab 02/27/19 2221  AST 15  ALT 16  ALKPHOS 123  BILITOT 0.6  PROT 5.7*  ALBUMIN 2.2*   CBC: Recent Labs  Lab 02/27/19 2221 02/28/19 0835  WBC 6.0 6.8  NEUTROABS 4.0  --   HGB 8.9* 9.2*  HCT 27.4* 28.2*  MCV 91.6 93.1  PLT 204 214   CBG: Recent Labs  Lab 02/28/19 1325 02/28/19 1621 02/28/19 2050 03/01/19 0716 03/01/19 1126  GLUCAP 118* 156* 165* 130* 201*    Signed:  Barton Dubois MD.  [facility] 03/01/2019, 12:52 PM

## 2019-03-01 NOTE — Progress Notes (Signed)
PT DISCHARGED HOME, IV REMOVED BY PT., STATES SHE KNEW SHE WAS GOING HOME SO SHE DID ME A FAVOR, ANGIO INTACT, VS STABLE, DENIES C/O PAIN. PT VERBALIZED UNDERSTANDING OF AL INSTRUCTIONS PROVIDED, LEFT FLOOR VIA WHEELCHAIR WITH BELONGINGS ACCOMPANIED BY NURSING STAFF. TRANSPORTATION HOME PROVIDED BY DAUGHTER.

## 2019-05-27 ENCOUNTER — Other Ambulatory Visit (HOSPITAL_COMMUNITY)
Admission: RE | Admit: 2019-05-27 | Discharge: 2019-05-27 | Disposition: A | Discharge status: Home / Self Care | Payer: Self-pay | Source: Ambulatory Visit | Attending: Nephrology | Admitting: Nephrology

## 2019-05-27 DIAGNOSIS — E1122 Type 2 diabetes mellitus with diabetic chronic kidney disease: Secondary | ICD-10-CM | POA: Insufficient documentation

## 2019-05-27 DIAGNOSIS — N189 Chronic kidney disease, unspecified: Secondary | ICD-10-CM | POA: Insufficient documentation

## 2019-05-27 LAB — PROTEIN / CREATININE RATIO, URINE
Creatinine, Urine: 64 mg/dL
Protein Creatinine Ratio: 11.88 mg/mg{Cre} — ABNORMAL HIGH (ref 0.00–0.15)
Total Protein, Urine: 760 mg/dL

## 2019-05-27 LAB — URINALYSIS, ROUTINE W REFLEX MICROSCOPIC
Bilirubin Urine: NEGATIVE
Glucose, UA: 150 mg/dL — AB
Ketones, ur: NEGATIVE mg/dL
Nitrite: NEGATIVE
Protein, ur: >=300 mg/dL — AB
Specific Gravity, Urine: 1.015 (ref 1.005–1.030)
WBC, UA: >50 WBC/hpf — ABNORMAL HIGH (ref 0–5)
pH: 5 (ref 5.0–8.0)

## 2019-05-27 LAB — CREATININE, URINE, 24 HOUR
Collection Interval-UCRE24: 24 hours
Creatinine, 24H Ur: 643 mg/d (ref 600–1800)
Creatinine, Urine: 42.84 mg/dL
Urine Total Volume-UCRE24: 1500 mL

## 2019-05-27 LAB — CBC WITH DIFFERENTIAL/PLATELET
Abs Immature Granulocytes: 0.07 10*3/uL (ref 0.00–0.07)
Basophils Absolute: 0 10*3/uL (ref 0.0–0.1)
Basophils Relative: 0 %
Eosinophils Absolute: 0.4 10*3/uL (ref 0.0–0.5)
Eosinophils Relative: 4 %
HCT: 26.4 % — ABNORMAL LOW (ref 36.0–46.0)
Hemoglobin: 8.7 g/dL — ABNORMAL LOW (ref 12.0–15.0)
Immature Granulocytes: 1 %
Lymphocytes Relative: 18 %
Lymphs Abs: 1.6 10*3/uL (ref 0.7–4.0)
MCH: 30.5 pg (ref 26.0–34.0)
MCHC: 33 g/dL (ref 30.0–36.0)
MCV: 92.6 fL (ref 80.0–100.0)
Monocytes Absolute: 0.6 10*3/uL (ref 0.1–1.0)
Monocytes Relative: 7 %
Neutro Abs: 6.1 10*3/uL (ref 1.7–7.7)
Neutrophils Relative %: 70 %
Platelets: 244 10*3/uL (ref 150–400)
RBC: 2.85 MIL/uL — ABNORMAL LOW (ref 3.87–5.11)
RDW: 13.6 % (ref 11.5–15.5)
WBC: 8.9 10*3/uL (ref 4.0–10.5)
nRBC: 0 % (ref 0.0–0.2)

## 2019-05-27 LAB — COMPREHENSIVE METABOLIC PANEL
ALT: 21 U/L (ref 0–44)
AST: 17 U/L (ref 15–41)
Albumin: 2.8 g/dL — ABNORMAL LOW (ref 3.5–5.0)
Alkaline Phosphatase: 123 U/L (ref 38–126)
Anion gap: 11 (ref 5–15)
BUN: 50 mg/dL — ABNORMAL HIGH (ref 6–20)
CO2: 18 mmol/L — ABNORMAL LOW (ref 22–32)
Calcium: 8.2 mg/dL — ABNORMAL LOW (ref 8.9–10.3)
Chloride: 109 mmol/L (ref 98–111)
Creatinine, Ser: 3.53 mg/dL — ABNORMAL HIGH (ref 0.44–1.00)
GFR calc Af Amer: 17 mL/min — ABNORMAL LOW (ref >60)
GFR calc non Af Amer: 14 mL/min — ABNORMAL LOW (ref >60)
Glucose, Bld: 170 mg/dL — ABNORMAL HIGH (ref 70–99)
Potassium: 5 mmol/L (ref 3.5–5.1)
Sodium: 138 mmol/L (ref 135–145)
Total Bilirubin: 0.7 mg/dL (ref 0.3–1.2)
Total Protein: 6.6 g/dL (ref 6.5–8.1)

## 2019-05-27 LAB — HEMOGLOBIN A1C
Hgb A1c MFr Bld: 8.1 % — ABNORMAL HIGH (ref 4.8–5.6)
Mean Plasma Glucose: 185.77 mg/dL

## 2019-05-27 LAB — FERRITIN: Ferritin: 112 ng/mL (ref 11–307)

## 2019-05-27 LAB — IRON AND TIBC
Iron: 56 ug/dL (ref 28–170)
Saturation Ratios: 18 % (ref 10.4–31.8)
TIBC: 307 ug/dL (ref 250–450)
UIBC: 251 ug/dL

## 2019-05-27 LAB — PHOSPHORUS: Phosphorus: 6.3 mg/dL — ABNORMAL HIGH (ref 2.5–4.6)

## 2019-05-27 LAB — VITAMIN D 25 HYDROXY (VIT D DEFICIENCY, FRACTURES): Vit D, 25-Hydroxy: 8.82 ng/mL — ABNORMAL LOW (ref 30–100)

## 2019-05-27 LAB — URIC ACID: Uric Acid, Serum: 5.7 mg/dL (ref 2.5–7.1)

## 2019-05-27 LAB — PROTEIN, URINE, 24 HOUR
Collection Interval-UPROT: 24 hours
Protein, 24H Urine: 11400 mg/d — ABNORMAL HIGH (ref 50–100)
Urine Total Volume-UPROT: 1500 mL

## 2019-05-27 LAB — VITAMIN B12: Vitamin B-12: 385 pg/mL (ref 180–914)

## 2019-05-27 LAB — HEPATITIS C ANTIBODY: HCV Ab: NONREACTIVE

## 2019-05-27 LAB — MAGNESIUM: Magnesium: 1.8 mg/dL (ref 1.7–2.4)

## 2019-05-27 LAB — HEPATITIS B SURFACE ANTIGEN: Hepatitis B Surface Ag: NONREACTIVE

## 2019-05-28 LAB — PROTEIN ELECTROPHORESIS, SERUM
A/G Ratio: 0.9 (ref 0.7–1.7)
Albumin ELP: 2.8 g/dL — ABNORMAL LOW (ref 2.9–4.4)
Alpha-1-Globulin: 0.3 g/dL (ref 0.0–0.4)
Alpha-2-Globulin: 0.8 g/dL (ref 0.4–1.0)
Beta Globulin: 1 g/dL (ref 0.7–1.3)
Gamma Globulin: 1.1 g/dL (ref 0.4–1.8)
Globulin, Total: 3.1 g/dL (ref 2.2–3.9)
M-Spike, %: 0.2 g/dL — ABNORMAL HIGH
Total Protein ELP: 5.9 g/dL — ABNORMAL LOW (ref 6.0–8.5)

## 2019-05-28 LAB — MISC LABCORP TEST (SEND OUT): Labcorp test code: 141330

## 2019-05-28 LAB — KAPPA/LAMBDA LIGHT CHAINS
Kappa free light chain: 130.4 mg/L — ABNORMAL HIGH (ref 3.3–19.4)
Kappa, lambda light chain ratio: 2.36 — ABNORMAL HIGH (ref 0.26–1.65)
Lambda free light chains: 55.2 mg/L — ABNORMAL HIGH (ref 5.7–26.3)

## 2019-05-28 LAB — ANA: Anti Nuclear Antibody (ANA): POSITIVE — AB

## 2019-05-28 LAB — ANCA TITERS
Atypical P-ANCA titer: <1:20 {titer}
C-ANCA: <1:20 {titer}
P-ANCA: <1:20 {titer}

## 2019-05-28 LAB — PARATHYROID HORMONE, INTACT (NO CA): PTH: 80 pg/mL — ABNORMAL HIGH (ref 15–65)

## 2019-05-28 LAB — GLOMERULAR BASEMENT MEMBRANE ANTIBODIES: GBM Ab: 4 units (ref 0–20)

## 2019-05-28 LAB — C3 COMPLEMENT: C3 Complement: 142 mg/dL (ref 82–167)

## 2019-05-28 LAB — C4 COMPLEMENT: Complement C4, Body Fluid: 25 mg/dL (ref 12–38)

## 2019-05-28 LAB — HEPATITIS B SURFACE ANTIBODY, QUANTITATIVE: Hep B S AB Quant (Post): <3.1 m[IU]/mL — ABNORMAL LOW (ref >9.9)

## 2019-05-29 LAB — HIV ANTIBODY (ROUTINE TESTING W REFLEX): HIV Screen 4th Generation wRfx: NONREACTIVE — AB

## 2019-05-29 LAB — IMMUNOFIXATION, URINE

## 2019-06-03 LAB — HEPATITIS C GENOTYPE

## 2019-06-12 ENCOUNTER — Other Ambulatory Visit: Payer: Self-pay

## 2019-06-12 ENCOUNTER — Encounter (HOSPITAL_COMMUNITY): Payer: Self-pay | Admitting: Emergency Medicine

## 2019-06-12 ENCOUNTER — Emergency Department (HOSPITAL_COMMUNITY)
Admission: EM | Admit: 2019-06-12 | Discharge: 2019-06-12 | Disposition: A | Discharge status: Home / Self Care | Payer: Self-pay | Attending: Emergency Medicine | Admitting: Emergency Medicine

## 2019-06-12 ENCOUNTER — Emergency Department (HOSPITAL_COMMUNITY): Payer: Self-pay

## 2019-06-12 DIAGNOSIS — N185 Chronic kidney disease, stage 5: Secondary | ICD-10-CM | POA: Diagnosis not present

## 2019-06-12 DIAGNOSIS — Z87891 Personal history of nicotine dependence: Secondary | ICD-10-CM | POA: Insufficient documentation

## 2019-06-12 DIAGNOSIS — D631 Anemia in chronic kidney disease: Secondary | ICD-10-CM | POA: Diagnosis not present

## 2019-06-12 DIAGNOSIS — Y9289 Other specified places as the place of occurrence of the external cause: Secondary | ICD-10-CM | POA: Insufficient documentation

## 2019-06-12 DIAGNOSIS — N178 Other acute kidney failure: Secondary | ICD-10-CM | POA: Diagnosis not present

## 2019-06-12 DIAGNOSIS — Z794 Long term (current) use of insulin: Secondary | ICD-10-CM | POA: Insufficient documentation

## 2019-06-12 DIAGNOSIS — N189 Chronic kidney disease, unspecified: Secondary | ICD-10-CM | POA: Diagnosis not present

## 2019-06-12 DIAGNOSIS — W010XXA Fall on same level from slipping, tripping and stumbling without subsequent striking against object, initial encounter: Secondary | ICD-10-CM | POA: Insufficient documentation

## 2019-06-12 DIAGNOSIS — Y998 Other external cause status: Secondary | ICD-10-CM | POA: Insufficient documentation

## 2019-06-12 DIAGNOSIS — M25551 Pain in right hip: Secondary | ICD-10-CM | POA: Insufficient documentation

## 2019-06-12 DIAGNOSIS — Y9301 Activity, walking, marching and hiking: Secondary | ICD-10-CM | POA: Insufficient documentation

## 2019-06-12 DIAGNOSIS — E119 Type 2 diabetes mellitus without complications: Secondary | ICD-10-CM | POA: Insufficient documentation

## 2019-06-12 DIAGNOSIS — I10 Essential (primary) hypertension: Secondary | ICD-10-CM | POA: Insufficient documentation

## 2019-06-12 DIAGNOSIS — I120 Hypertensive chronic kidney disease with stage 5 chronic kidney disease or end stage renal disease: Secondary | ICD-10-CM | POA: Diagnosis not present

## 2019-06-12 DIAGNOSIS — E211 Secondary hyperparathyroidism, not elsewhere classified: Secondary | ICD-10-CM | POA: Diagnosis not present

## 2019-06-12 DIAGNOSIS — E559 Vitamin D deficiency, unspecified: Secondary | ICD-10-CM | POA: Diagnosis not present

## 2019-06-12 DIAGNOSIS — Z79899 Other long term (current) drug therapy: Secondary | ICD-10-CM | POA: Insufficient documentation

## 2019-06-12 DIAGNOSIS — E872 Acidosis: Secondary | ICD-10-CM | POA: Diagnosis not present

## 2019-06-12 LAB — CBG MONITORING, ED: Glucose-Capillary: 127 mg/dL — ABNORMAL HIGH (ref 70–99)

## 2019-06-12 IMAGING — DX DG HIP (WITH OR WITHOUT PELVIS) 2-3V*R*
3 series · 3 of 3 positions shown · non-contrast
Comparison: [DATE].

CLINICAL DATA: Fall, right hip pain, initial encounter.

EXAM:
DG HIP (WITH OR WITHOUT PELVIS) 2-3V RIGHT

[pelvis ap]
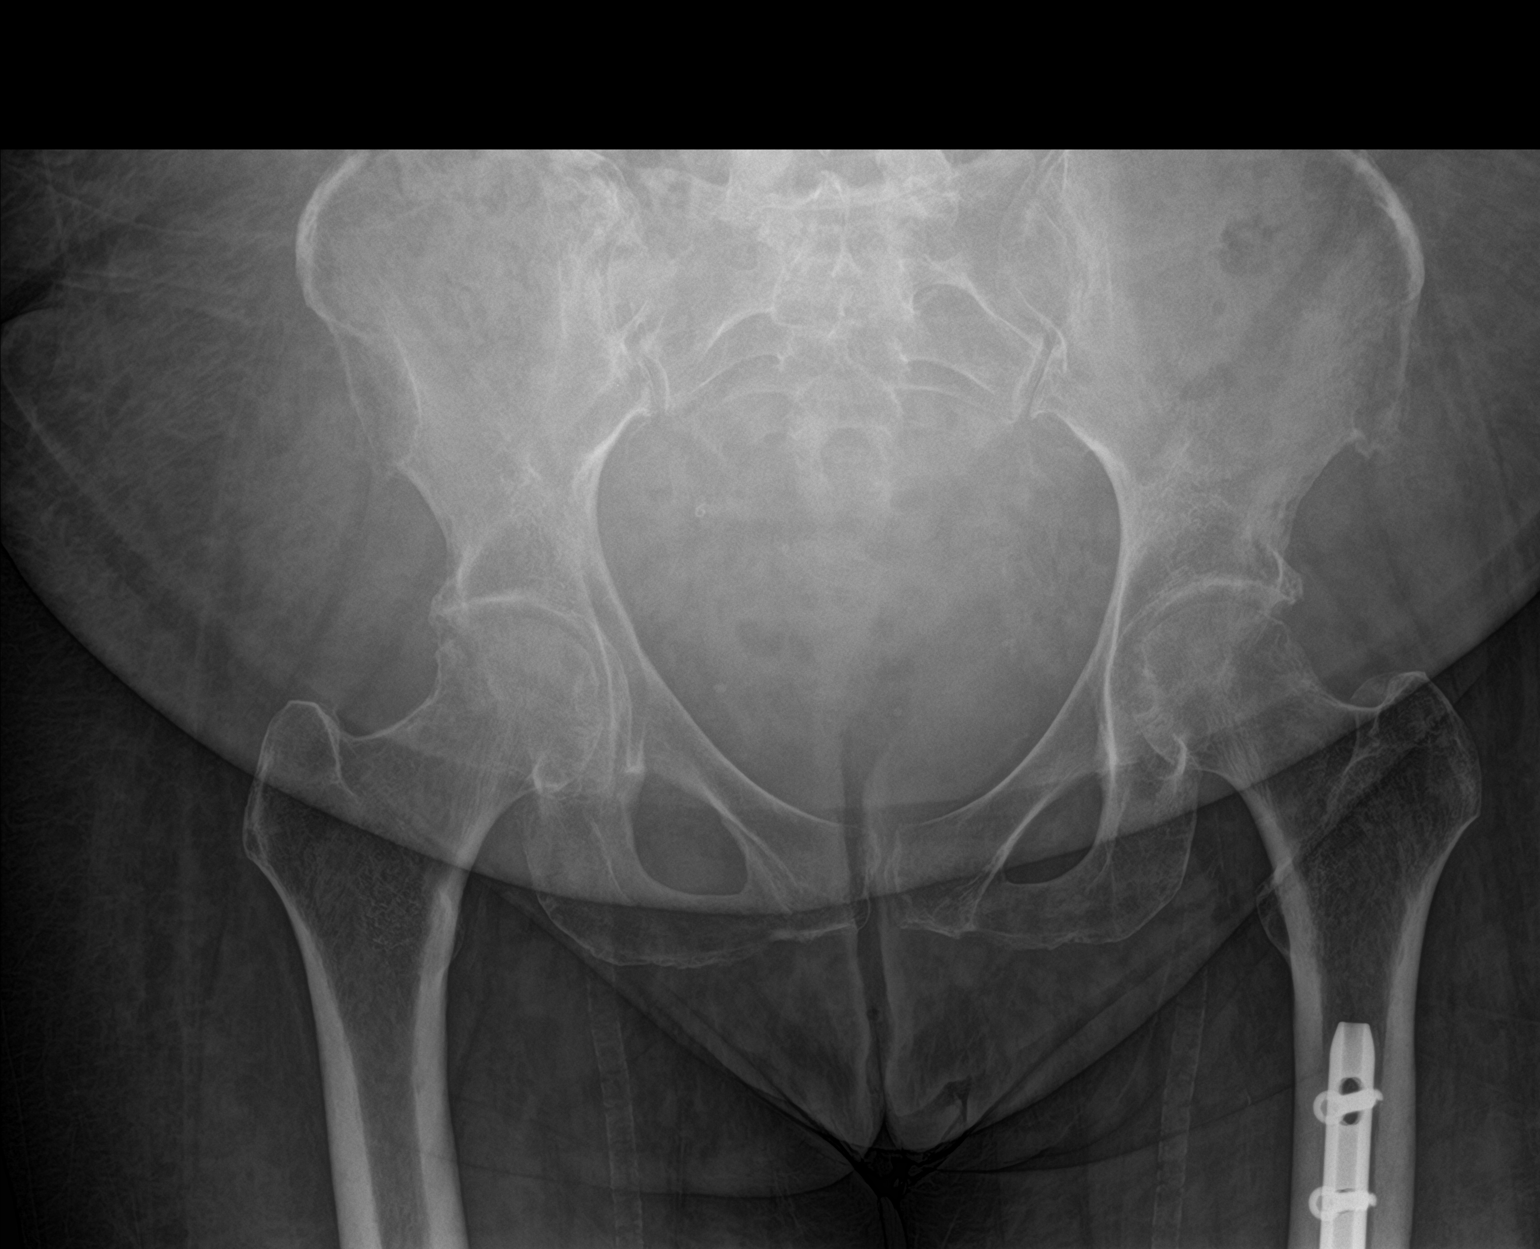

[hip ap]
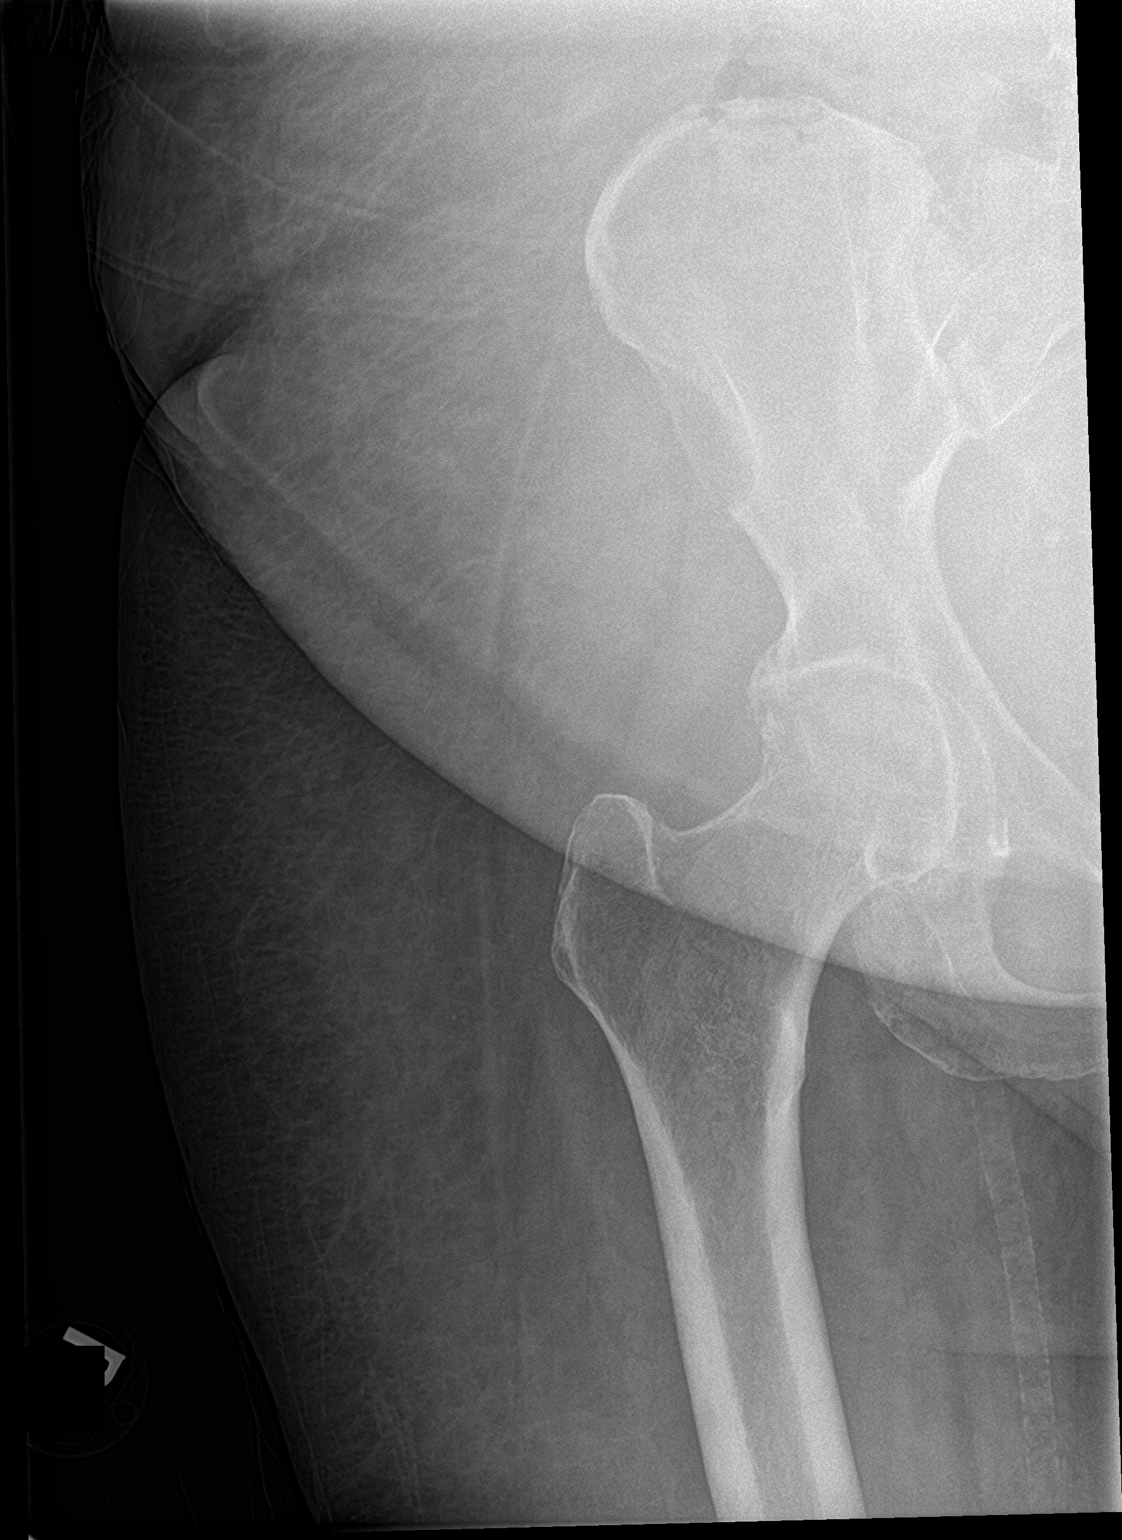

[hip frog leg]
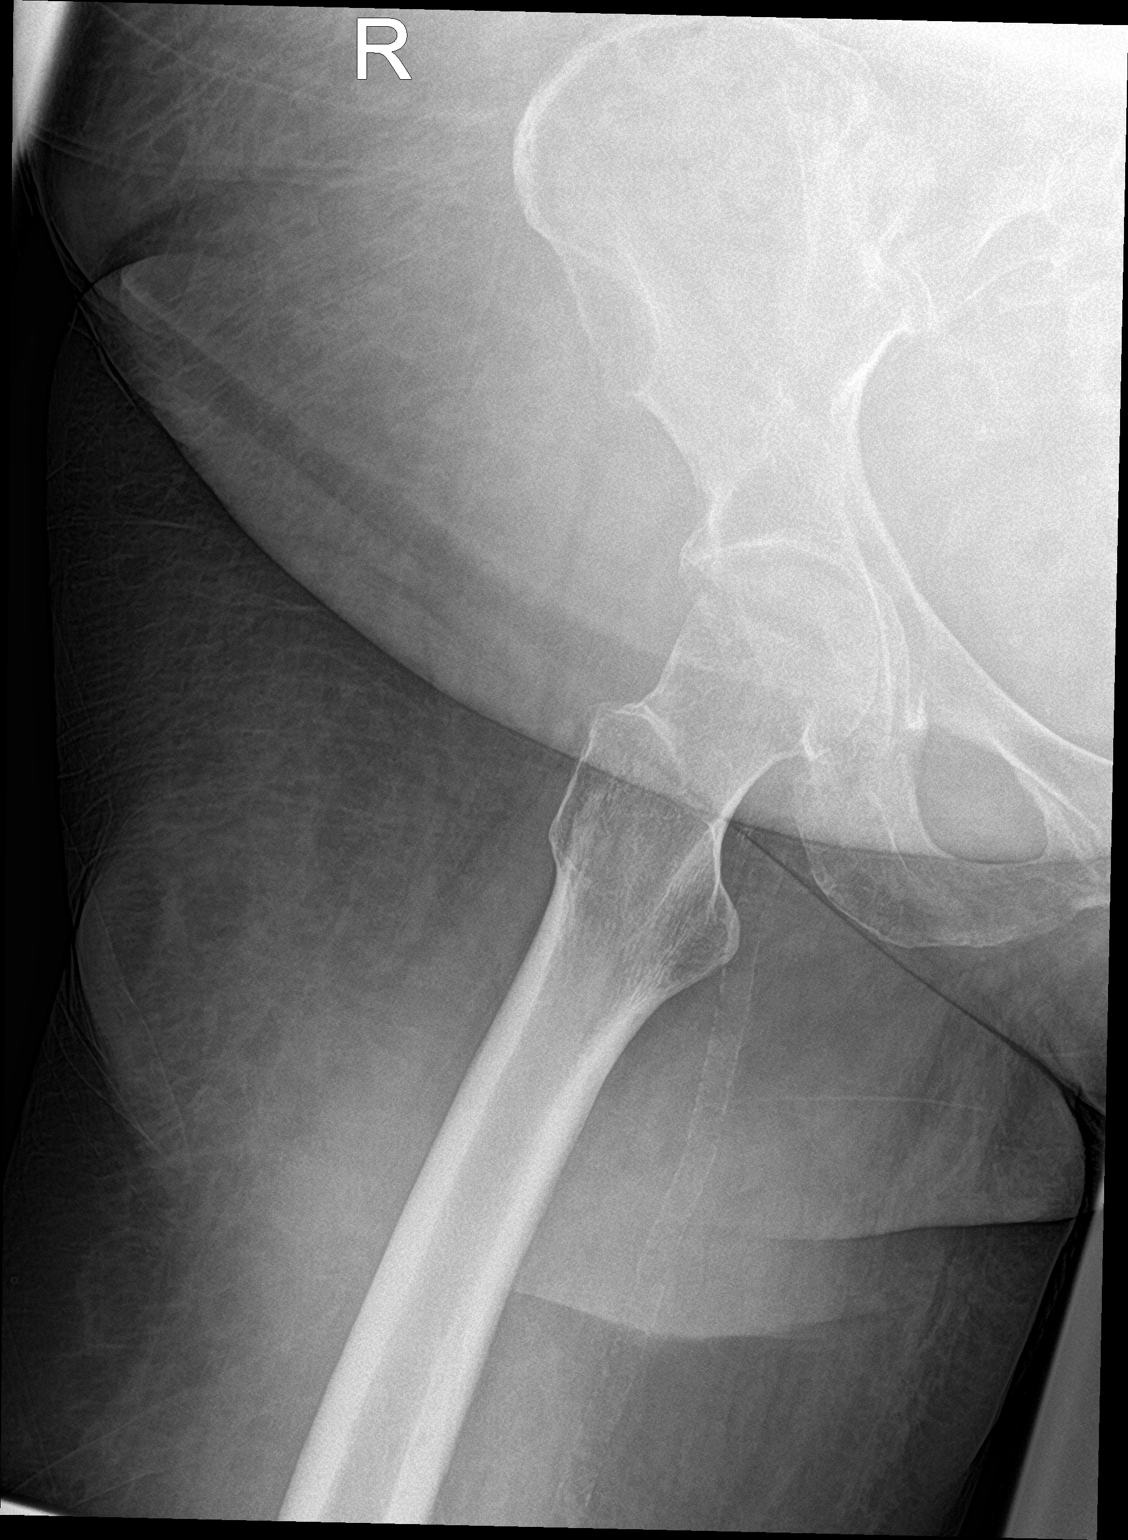

[3 of 3 positions shown; findings below may reference images not displayed]

FINDINGS: No fracture or dislocation. Mild subchondral sclerosis in the hips.
Hip joint space appears maintained bilaterally. An intramedullary
rod is partially imaged in the proximal left femur.
IMPRESSION: 1. No acute findings.
2. Mild acetabular subchondral sclerosis bilaterally.

## 2019-06-12 MED ORDER — METHOCARBAMOL 500 MG PO TABS: 500.0000 mg | ORAL_TABLET | Freq: Three times a day (TID) | ORAL | 0 refills | Status: DC

## 2019-06-12 MED ORDER — FENTANYL CITRATE (PF) 100 MCG/2ML IJ SOLN
50.0000 ug | Freq: Once | INTRAMUSCULAR | Status: AC
  Administered 2019-06-12: 50 ug via INTRAMUSCULAR
  Filled 2019-06-12: qty 2

## 2019-06-12 MED ORDER — OXYCODONE-ACETAMINOPHEN 5-325 MG PO TABS: 1.0000 | ORAL_TABLET | ORAL | 0 refills | Status: DC | PRN

## 2019-06-12 NOTE — ED Triage Notes (Signed)
Patient reports she tripped and fell on Tuesday. C/O right hip pain.

## 2019-06-12 NOTE — ED Provider Notes (Signed)
[facility] EMERGENCY DEPARTMENT Provider Note   CSN: [id number] Arrival date & time: 06/12/19  1537     History   Chief Complaint Chief Complaint  Patient presents with  . Fall    HPI Melanie Hall is a 48 y.o. female.     HPI  Melanie Hall is a 48 y.o. female who presents to the Emergency Department complaining of right hip pain for three days.  Pain is secondary to a fall.  She states that she slipped and fell backwards landing on concrete.  She reports having persistent pain to her right upper buttock and lateral right hip and upper right thigh.  Pain is constant, but worsens with weight bearing.  She has been taking a friend's gabapentin with no relief.  She denies pain radiating into her spine or down her right leg.  She denies head injury, LOC, neck pain, abdominal pain, numbness or weakness of the lower extremities.     Past Medical History:  Diagnosis Date  . Cataracts, bilateral    surgery to remove  . Diabetes mellitus    type 2  . GERD (gastroesophageal reflux disease)   . Hypertension   . SVD (spontaneous vaginal delivery)    x 4    Patient Active Problem List   Diagnosis Date Noted  . Diarrhea   . AKI (acute kidney injury) ([place]) 02/28/2019  . Acute on chronic renal failure ([place]) 02/28/2019  . GERD (gastroesophageal reflux disease) 02/28/2019  . Closed fracture of left femur ([place]) 10/28/2018  . Hypertension associated with diabetes ([place]) 10/28/2018  . Sprain of left ankle   . Emphysematous cystitis 08/26/2018  . Vaginosis 08/26/2018  . Hypokalemia 08/26/2018  . Normocytic anemia 08/26/2018  . Closed fracture of right ankle 11/06/2017  . IRREGULAR MENSES 09/14/2009  . BURN, FIRST DEGREE, ARM 09/14/2009  . HYPERCHOLESTEROLEMIA 11/19/2008  . PARONYCHIA, RIGHT GREAT TOE 07/30/2008  . ABSCESS, TOOTH 03/31/2008  . Type 2 diabetes with nephropathy ([place]) 07/02/2007  . CATARACTS, BILATERAL 07/02/2007    Past Surgical History:  Procedure Laterality  Date  . EYE SURGERY Bilateral    cataracts removed  . FEMUR IM NAIL Left 10/28/2018   Procedure: RETROGRADE FEMORAL NAILING;  Surgeon: Meredith Pel, MD;  Location: [place];  Service: Orthopedics;  Laterality: Left;  . IM NAILING FEMORAL SHAFT RETROGRADE Left 10/28/2018  . KNEE SURGERY    . TUBAL LIGATION       OB History    Gravida  4   Para  4   Term  4   Preterm      AB      Living        SAB      TAB      Ectopic      Multiple      Live Births               Home Medications    Prior to Admission medications   Medication Sig Start Date End Date Taking? Authorizing Provider  acetaminophen (TYLENOL) 325 MG tablet Take 2 tablets (650 mg total) by mouth every 6 (six) hours as needed for mild pain or headache (or Fever >/= 101). 08/28/18   Barton Dubois, MD  amLODipine (NORVASC) 5 MG tablet Take 1 tablet (5 mg total) by mouth daily. 03/01/19 02/29/20  Barton Dubois, MD  aspirin EC 81 MG EC tablet Take 1 tablet (81 mg total) by mouth 2 (two) times daily. Patient not taking: Reported  on 02/28/2019 11/03/18   Meredith Pel, MD  blood glucose meter kit and supplies KIT Dispense based on patient and insurance preference. Use to check blood sugar three times daily as directed. (FOR ICD-9 250.00, 250.01). Patient not taking: Reported on 02/28/2019 08/28/18   Barton Dubois, MD  gabapentin (NEURONTIN) 100 MG capsule Take 1 capsule (100 mg total) by mouth 2 (two) times daily. 03/01/19   Barton Dubois, MD  insulin glargine (LANTUS) 100 UNIT/ML injection Inject 15 Units into the skin at bedtime.    [provider]  lovastatin (MEVACOR) 20 MG tablet Take 40 mg by mouth at bedtime.    [provider]  pantoprazole (PROTONIX) 40 MG tablet Take 1 tablet (40 mg total) by mouth daily. 08/28/18 08/28/19  Barton Dubois, MD    Family History Family History  Problem Relation Age of Onset  . Diabetes Mother   . Hypertension Mother   . Bipolar disorder Mother    . Brain cancer Maternal Aunt   . Heart disease Maternal Grandmother   . Diabetes Maternal Grandmother   . Breast cancer Maternal Aunt     Social History Social History   Tobacco Use  . Smoking status: Former Smoker    Types: Cigarettes    Quit date: 1997    Years since quitting: 23.9  . Smokeless tobacco: Never Used  Substance Use Topics  . Alcohol use: No  . Drug use: No     Allergies   Patient has no known allergies.   Review of Systems Review of Systems  Constitutional: Negative for fever.  Respiratory: Negative for choking and shortness of breath.   Cardiovascular: Negative for chest pain.  Gastrointestinal: Negative for abdominal pain, constipation and vomiting.  Genitourinary: Negative for decreased urine volume, difficulty urinating, dysuria, flank pain and hematuria.  Musculoskeletal: Positive for arthralgias (right hip pain). Negative for back pain, joint swelling and neck pain.  Skin: Negative for rash.  Neurological: Negative for weakness and numbness.     Physical Exam Updated Vital Signs BP (!) 181/108 (BP Location: Right Arm)   Pulse 80   Temp 97.8 F (36.6 C) (Oral)   Resp 20   Ht 5' 2.5" (1.588 m)   Wt 78 kg   LMP 12/17/2016   SpO2 93%   BMI 30.96 kg/m   Physical Exam Vitals signs and nursing note reviewed.  Constitutional:      General: She is not in acute distress.    Appearance: Normal appearance. She is well-developed. She is not ill-appearing.  HENT:     Head: Atraumatic.  Neck:     Musculoskeletal: Normal range of motion and neck supple. No muscular tenderness.  Cardiovascular:     Rate and Rhythm: Normal rate and regular rhythm.     Pulses: Normal pulses.     Comments: DP pulses are strong and palpable bilaterally Pulmonary:     Effort: Pulmonary effort is normal. No respiratory distress.     Breath sounds: Normal breath sounds.  Chest:     Chest wall: No tenderness.  Abdominal:     General: There is no distension.      Palpations: Abdomen is soft.     Tenderness: There is no abdominal tenderness.  Musculoskeletal:        General: Tenderness present.     Lumbar back: She exhibits tenderness and pain. She exhibits normal range of motion, no swelling, no deformity, no laceration and normal pulse.     Comments: Lower lumbar spinal  tenderness with ttp of the right SI joint space.  No abrasions, ecchymosis or bony step offs. Pt has 5/5 strength against resistance of bilateral lower extremities.     Skin:    General: Skin is warm.     Capillary Refill: Capillary refill takes less than 2 seconds.     Findings: No rash.  Neurological:     General: No focal deficit present.     Mental Status: She is alert and oriented to person, place, and time.     Sensory: No sensory deficit.     Motor: Motor function is intact. No weakness or abnormal muscle tone.     Coordination: Coordination is intact.     Gait: Gait normal.     Deep Tendon Reflexes:     Reflex Scores:      Patellar reflexes are 2+ on the right side and 2+ on the left side.      Achilles reflexes are 2+ on the right side and 2+ on the left side.    Comments: CN II-XII grossly intact.        ED Treatments / Results  Labs (all labs ordered are listed, but only abnormal results are displayed) Labs Reviewed  CBG MONITORING, ED - Abnormal; Notable for the following components:      Result Value   Glucose-Capillary 127 (*)    All other components within normal limits    EKG None  Radiology Dg Hip Unilat W Or Wo Pelvis 2-3 Views Right  Result Date: 06/12/2019 CLINICAL DATA:  Fall, right hip pain, initial encounter. EXAM: DG HIP (WITH OR WITHOUT PELVIS) 2-3V RIGHT COMPARISON:  02/25/2010. FINDINGS: No fracture or dislocation. Mild subchondral sclerosis in the hips. Hip joint space appears maintained bilaterally. An intramedullary rod is partially imaged in the proximal left femur. IMPRESSION: 1. No acute findings. 2. Mild acetabular subchondral  sclerosis bilaterally. Electronically Signed   By: Lorin Picket M.D.   On: 06/12/2019 16:50    Procedures Procedures (including critical care time)  Medications Ordered in ED Medications  fentaNYL (SUBLIMAZE) injection 50 mcg (50 mcg Intramuscular Given 06/12/19 1651)     Initial Impression / Assessment and Plan / ED Course  I have reviewed the triage vital signs and the nursing notes.  Pertinent labs & imaging results that were available during my care of the patient were reviewed by me and considered in my medical decision making (see chart for details).        BP improved.  NV intact.  XR neg for acute bony injury.  Discussed XR findings with pt and need for close orthopedic f/u if sx's not improving as occult fx cannot be excluded.  I feel that her sx's are most likely musculoskeletal.  She verbalized understanding.  No concerning sx's for cauda equina, no foot drop.  Has seen Dr. Aline Brochure in past and prefers f/u with him.  Uses a walker at baseline.    Final Clinical Impressions(s) / ED Diagnoses   Final diagnoses:  Right hip pain    ED Discharge Orders    None       Bufford Lope 06/13/19 2127    Maudie Flakes, MD 06/14/19 2014

## 2019-06-12 NOTE — Discharge Instructions (Addendum)
Apply ice packs on/off to your hip.  Call Dr. Ruthe Mannan office to arrange a follow-up appt for next week

## 2019-07-23 ENCOUNTER — Ambulatory Visit (HOSPITAL_COMMUNITY)
Admission: RE | Admit: 2019-07-23 | Discharge: 2019-07-23 | Disposition: A | Discharge status: Home / Self Care | Payer: Medicaid Other | Source: Ambulatory Visit | Attending: Hematology | Admitting: Hematology

## 2019-07-23 ENCOUNTER — Inpatient Hospital Stay (HOSPITAL_COMMUNITY): Payer: Medicaid Other | Attending: Hematology | Admitting: Hematology

## 2019-07-23 ENCOUNTER — Encounter (HOSPITAL_COMMUNITY)
Admission: RE | Admit: 2019-07-23 | Discharge: 2019-07-23 | Disposition: A | Discharge status: Home / Self Care | Payer: Self-pay | Source: Ambulatory Visit | Attending: Nephrology | Admitting: Nephrology

## 2019-07-23 ENCOUNTER — Encounter (HOSPITAL_COMMUNITY): Payer: Self-pay

## 2019-07-23 ENCOUNTER — Inpatient Hospital Stay (HOSPITAL_COMMUNITY): Payer: Medicaid Other

## 2019-07-23 ENCOUNTER — Other Ambulatory Visit: Payer: Self-pay

## 2019-07-23 ENCOUNTER — Encounter (HOSPITAL_COMMUNITY): Payer: Self-pay | Admitting: Hematology

## 2019-07-23 VITALS — BP 183/101 | HR 89 | Temp 97.8°F | Resp 18 | Ht 62.0 in | Wt 148.3 lb

## 2019-07-23 DIAGNOSIS — N179 Acute kidney failure, unspecified: Secondary | ICD-10-CM | POA: Insufficient documentation

## 2019-07-23 DIAGNOSIS — I129 Hypertensive chronic kidney disease with stage 1 through stage 4 chronic kidney disease, or unspecified chronic kidney disease: Secondary | ICD-10-CM | POA: Diagnosis not present

## 2019-07-23 DIAGNOSIS — Z7982 Long term (current) use of aspirin: Secondary | ICD-10-CM | POA: Diagnosis not present

## 2019-07-23 DIAGNOSIS — Z87891 Personal history of nicotine dependence: Secondary | ICD-10-CM | POA: Diagnosis not present

## 2019-07-23 DIAGNOSIS — D472 Monoclonal gammopathy: Secondary | ICD-10-CM

## 2019-07-23 DIAGNOSIS — E1122 Type 2 diabetes mellitus with diabetic chronic kidney disease: Secondary | ICD-10-CM | POA: Insufficient documentation

## 2019-07-23 DIAGNOSIS — C9000 Multiple myeloma not having achieved remission: Secondary | ICD-10-CM | POA: Diagnosis not present

## 2019-07-23 DIAGNOSIS — Z79899 Other long term (current) drug therapy: Secondary | ICD-10-CM | POA: Diagnosis not present

## 2019-07-23 DIAGNOSIS — D649 Anemia, unspecified: Secondary | ICD-10-CM | POA: Diagnosis not present

## 2019-07-23 DIAGNOSIS — K219 Gastro-esophageal reflux disease without esophagitis: Secondary | ICD-10-CM | POA: Insufficient documentation

## 2019-07-23 HISTORY — DX: Anemia, unspecified: D64.9

## 2019-07-23 LAB — CBC WITH DIFFERENTIAL/PLATELET
Abs Immature Granulocytes: 0.04 10*3/uL (ref 0.00–0.07)
Basophils Absolute: 0.1 10*3/uL (ref 0.0–0.1)
Basophils Relative: 1 %
Eosinophils Absolute: 0.3 10*3/uL (ref 0.0–0.5)
Eosinophils Relative: 4 %
HCT: 29.6 % — ABNORMAL LOW (ref 36.0–46.0)
Hemoglobin: 9.9 g/dL — ABNORMAL LOW (ref 12.0–15.0)
Immature Granulocytes: 1 %
Lymphocytes Relative: 23 %
Lymphs Abs: 1.9 10*3/uL (ref 0.7–4.0)
MCH: 30.7 pg (ref 26.0–34.0)
MCHC: 33.4 g/dL (ref 30.0–36.0)
MCV: 91.6 fL (ref 80.0–100.0)
Monocytes Absolute: 0.7 10*3/uL (ref 0.1–1.0)
Monocytes Relative: 9 %
Neutro Abs: 5.2 10*3/uL (ref 1.7–7.7)
Neutrophils Relative %: 62 %
Platelets: 208 10*3/uL (ref 150–400)
RBC: 3.23 MIL/uL — ABNORMAL LOW (ref 3.87–5.11)
RDW: 13 % (ref 11.5–15.5)
WBC: 8.3 10*3/uL (ref 4.0–10.5)
nRBC: 0 % (ref 0.0–0.2)

## 2019-07-23 LAB — COMPREHENSIVE METABOLIC PANEL
ALT: 11 U/L (ref 0–44)
AST: 12 U/L — ABNORMAL LOW (ref 15–41)
Albumin: 3 g/dL — ABNORMAL LOW (ref 3.5–5.0)
Alkaline Phosphatase: 100 U/L (ref 38–126)
Anion gap: 9 (ref 5–15)
BUN: 40 mg/dL — ABNORMAL HIGH (ref 6–20)
CO2: 19 mmol/L — ABNORMAL LOW (ref 22–32)
Calcium: 8.4 mg/dL — ABNORMAL LOW (ref 8.9–10.3)
Chloride: 109 mmol/L (ref 98–111)
Creatinine, Ser: 5.19 mg/dL — ABNORMAL HIGH (ref 0.44–1.00)
GFR calc Af Amer: 10 mL/min — ABNORMAL LOW (ref >60)
GFR calc non Af Amer: 9 mL/min — ABNORMAL LOW (ref >60)
Glucose, Bld: 146 mg/dL — ABNORMAL HIGH (ref 70–99)
Potassium: 4.1 mmol/L (ref 3.5–5.1)
Sodium: 137 mmol/L (ref 135–145)
Total Bilirubin: 0.9 mg/dL (ref 0.3–1.2)
Total Protein: 7.3 g/dL (ref 6.5–8.1)

## 2019-07-23 LAB — LACTATE DEHYDROGENASE: LDH: 226 U/L — ABNORMAL HIGH (ref 98–192)

## 2019-07-23 IMAGING — DX DG BONE SURVEY MET
9 of 10 series · 9 of 10 positions shown · non-contrast
Comparison: Relevant previous radiographs and CT examinations.

CLINICAL DATA: Monoclonal gammopathy of unknown significance.

EXAM:
METASTATIC BONE SURVEY

[skull lat]
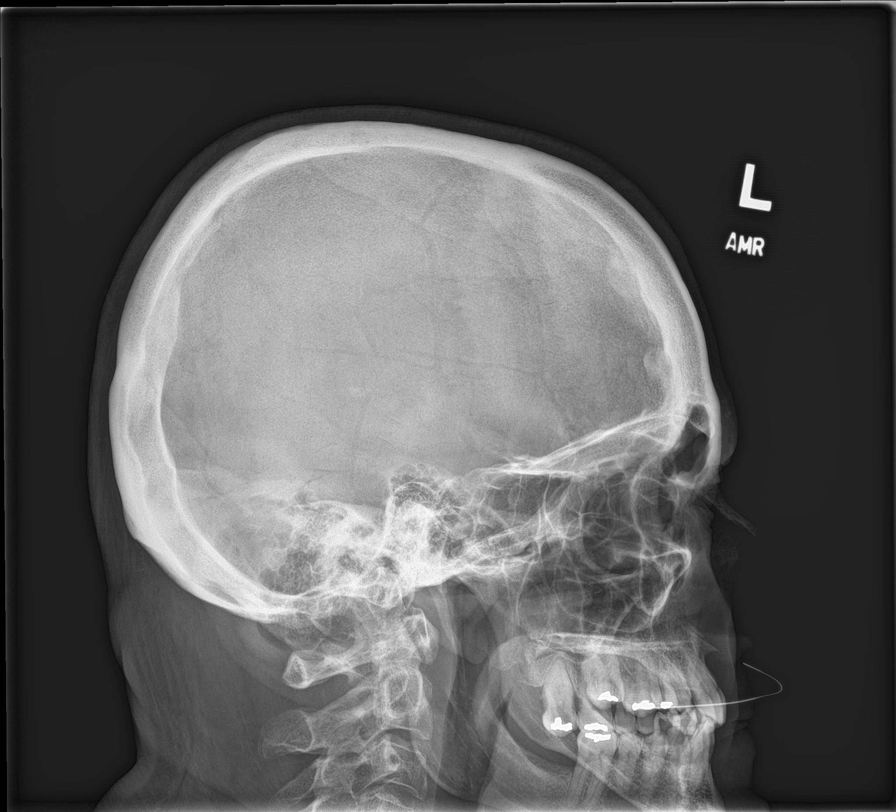

[shoulder ap (1 of 2)]
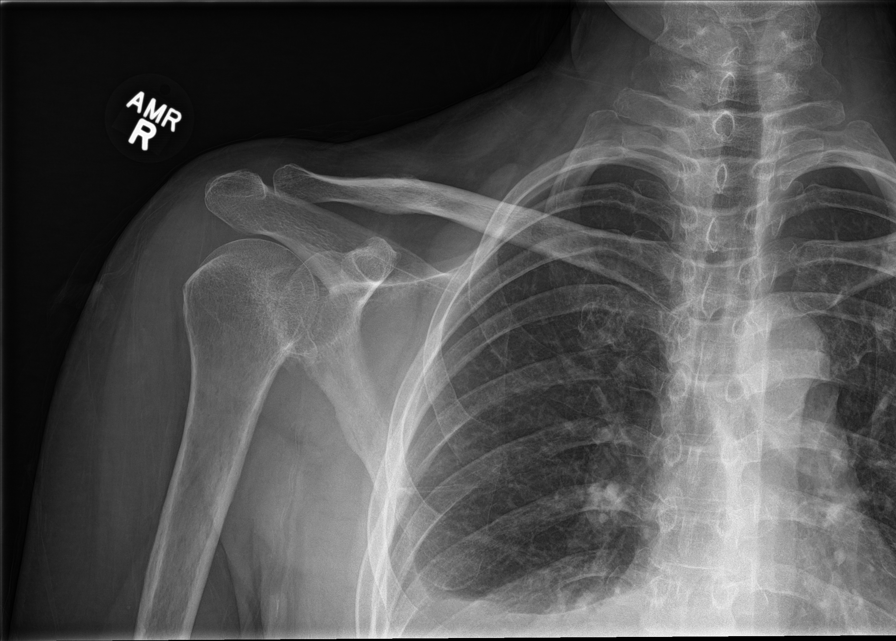

[shoulder ap (2 of 2)]
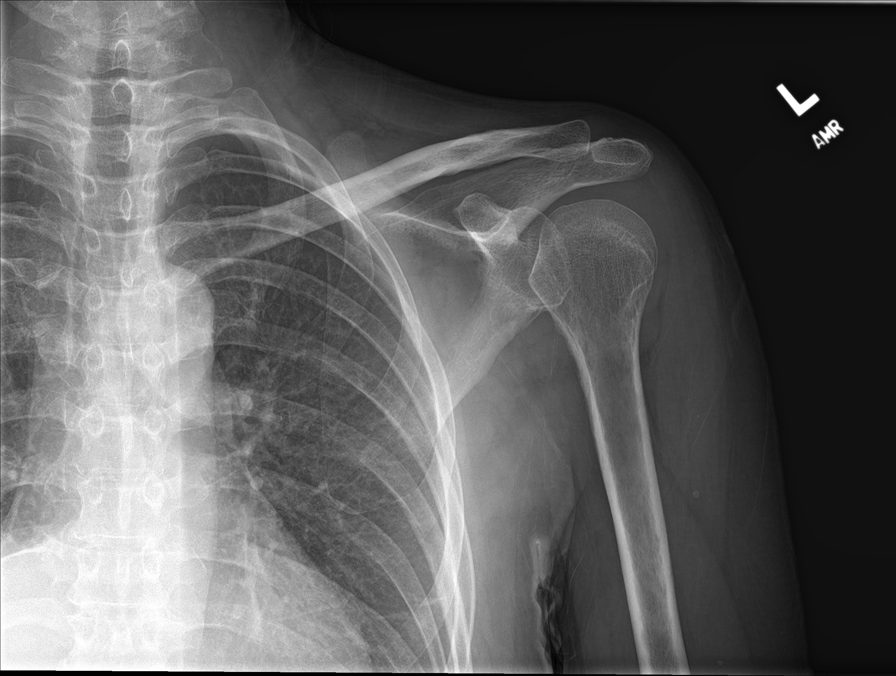

[humerus ap]
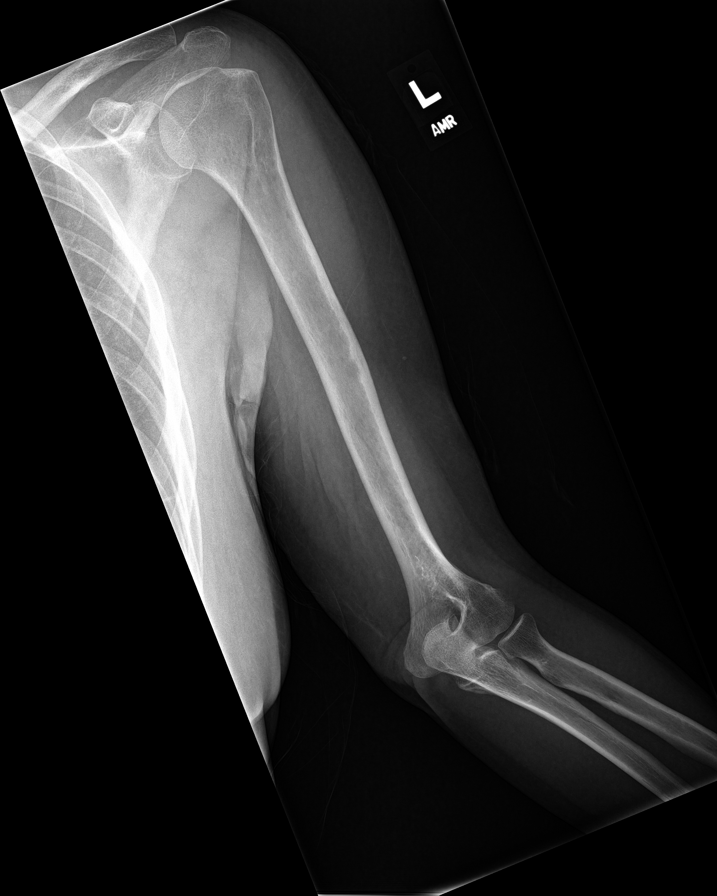

[forearm ap (1 of 2)]
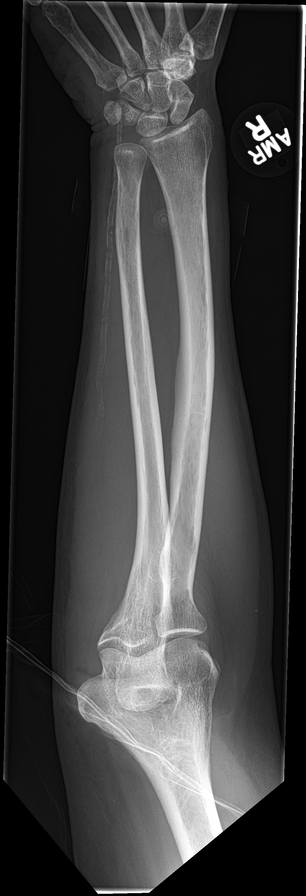

[forearm ap (2 of 2)]
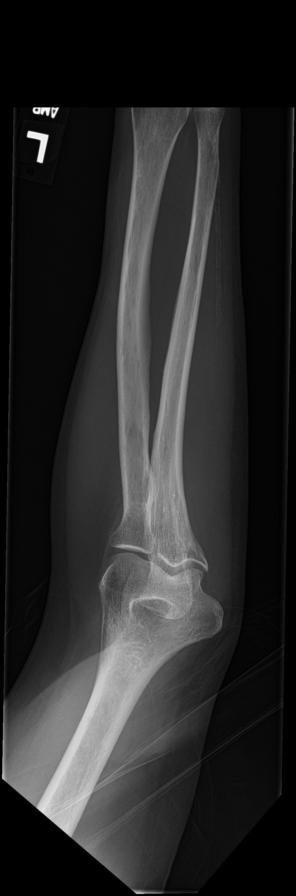

[c-spine ap]
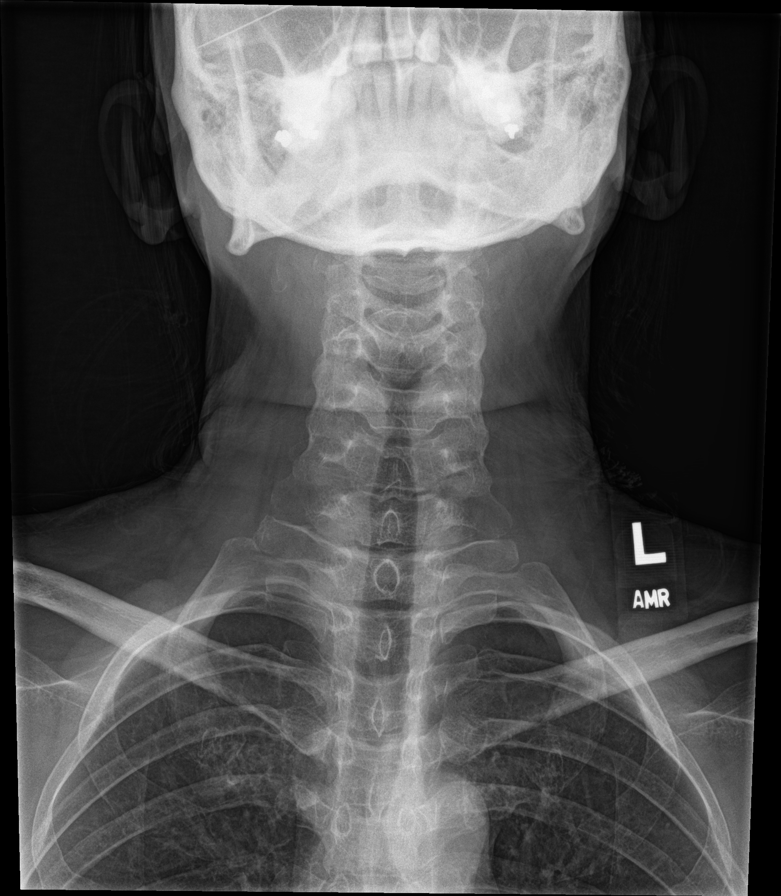

[c-spine lat]
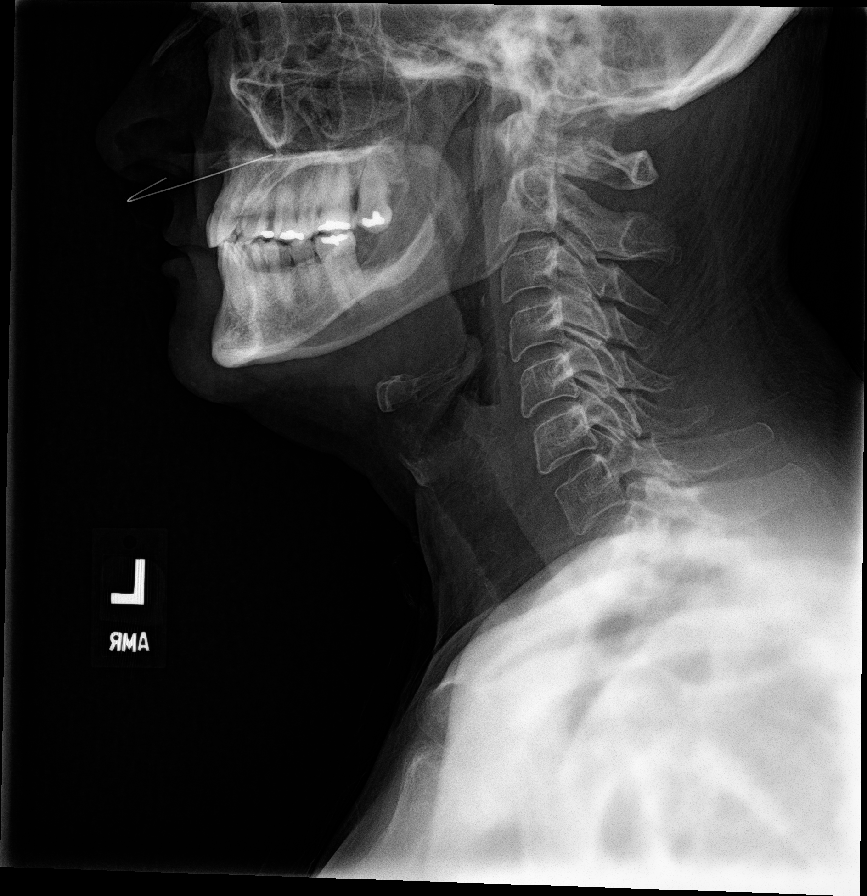

[t-spine ap]
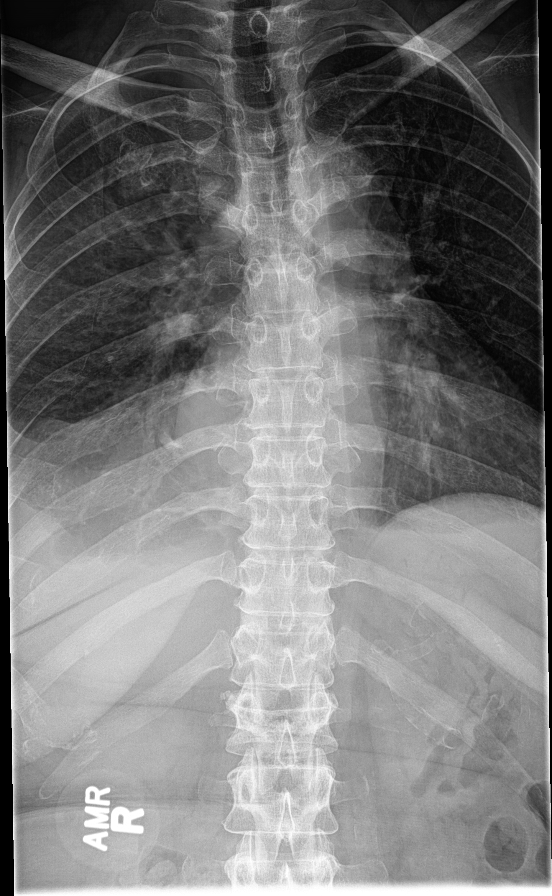

[9 of 10 positions shown; findings below may reference images not displayed]

FINDINGS: Skull:  Normal. No destructive lesions.

Cervical spine:  Normal. No destructive lesions.

Thoracic spine: Minimal levoconvex scoliosis. Mild anterior spur
formation in the lower thoracic spine. No destructive lesions,
fractures or subluxations.

Lumbar spine: Approximately 40% overall and 60% anterior compression
deformity of the L1 vertebral body with patchy lucent areas within
the anterior [DATE] of the vertebral body. None of these findings were
present on the abdomen and pelvis CT dated [DATE]. No additional
lytic areas. Mild anterior spur formation at multiple levels without
significant change.

Chest: Moderate-sized right pleural effusion and small left pleural
effusion. Both are better defined than seen on [DATE] and mildly
increased. Small amount of linear scarring at the left lung base.
Normal sized heart. Tortuous aorta. No lytic lesions.

Bilateral shoulders:  Unremarkable. No lytic lesions.

Bilateral humeri:  Unremarkable. No lytic lesions.

Bilateral forearms: Atheromatous arterial calcifications
bilaterally. No lytic lesions.

Pelvis:  Bilateral arterial calcifications. No lytic lesions.

Bilateral hips:  No lytic lesions.

Bilateral femurs: Previously demonstrated screw and rod fixation of
the left femur with a healed distal femoral fracture. No discrete
lytic lesions.

Bilateral lower legs:  No lytic lesions.
IMPRESSION: 1. Interval 40% overall and 60% anterior compression deformity of
the L1 vertebral body with interval vertebral body lucencies. This
is suspicious for a pathological fracture, possibly due to myeloma
or a bone metastasis.
2. No additional lytic lesions.
3. Moderate-sized right pleural effusion and small left pleural
effusion, both mildly increased.

## 2019-07-23 MED ORDER — EPOETIN ALFA 3000 UNIT/ML IJ SOLN: 3000.0000 [IU] | Freq: Once | INTRAMUSCULAR | Status: DC

## 2019-07-23 NOTE — Progress Notes (Signed)
Patient was seen in cancer center this morning and had BP of 183/101.  When rechecked in clinic it was 185/104.  Dr Theador Hawthorne notified, injection was held and will see her in two weeks.  Educated on taking BP medications 1-1 1/2 hours prior to next visit, as Epogen cannot be given with pressure that high.  Verbalized understanding and daughter educated as well.  Hgb 9.9 today

## 2019-07-23 NOTE — Assessment & Plan Note (Addendum)
1.  Monoclonal gammopathy of undetermined significance - Mechanical fall in April 2020 that resulted in a fractured left femur.  Status post IM nailing.  She required 1 unit of packed red blood cells postop for hemoglobin of 7.8.   -Worsening anemia and kidney function noted over the past year.  Of note, patient serum creatinine in February 2020 was 0.98 with normal GFR, most recently serum creatinine noted to be 3.53 with a GFR of 14.   -November 2020 : protein electrophoresis revealed an M spike of 0.2 g/dL, kappa free light chains elevated at 130.4 mg/L, lambda light chains elevated at 55.2, and with a ratio of 2.36.  Ferritin and iron studies were within normal.  ANA panel was positive.  Viral work-up was negative.  -Discussed findings with patient in details.  There is concern for plasma cell disorder, such as Multiple Myeloma as she was found to have serum monoclonal protein, elevated free light chains, and recent fracture of left femur.  Patient also has anemia and elevated serum creatinine.  Nonetheless, we need to complete additional work-up to determine diagnosis.  We will repeat myeloma labs.  Recommend skeletal survey.  Furthermore, recommend bone density scan.  Agree with nephrology kidney biopsy is warranted due to acute kidney injury.  We will see the patient back in 2 weeks to discuss diagnosis and possible treatment plans.  Addendum: -I have independently elicited history and examined patient.  I agree with the assessment and plan written by my nurse practitioner Carver Fila, FNP.  She had a fall at home resulted in a fracture requiring ORIF.  Recently noted to have nephrotic range proteinuria.  Also found to have M spike of 0.2 g.  Light chain ratio is mildly elevated at 2.36.  Would recommend a skeletal survey and a repeat of myeloma labs including immunofixation.  24-hour urine immunofixation was negative.  Would recommend kidney biopsy with amyloid stains.  I would also recommend a bone  density test.

## 2019-07-23 NOTE — Progress Notes (Signed)
[place] Initial Visit:  Patient Care Team: Health, [facility] as PCP - General  CHIEF COMPLAINTS/PURPOSE OF CONSULTATION: -Monoclonal gammopathy of undetermined significance    HISTORY OF PRESENTING ILLNESS: [place] 49 y.o. female presents for consult regarding recent finding of monoclonal gammopathy of undetermined significance.  She has a past medical history significant for diabetes, hypertension, and normocytic anemia.  Patient had a recent fall in April 2020 that resulted in a fractured left femur.  She is status post IM nailing.  She required 1 unit of packed red blood cells postop for hemoglobin of 7.8.  In review of the patient's medical record it can be noted that patient has had worsening anemia over the past year.  Also of note patient serum creatinine in February 2020 was 0.98 with normal GFR, most recently serum creatinine noted to be 3.53 with a GFR of 14.  She has been seen by nephrology.  Nephrology work-up also included a protein electrophoresis which revealed an M spike of 0.2 g/dL, kappa free light chains elevated at 130.4 mg/L, lambda light chains elevated at 55.2, and with a ratio of 2.36.  Ferritin and iron studies were within normal.  ANA panel was positive.  Viral work-up was negative.  There is concern for acute kidney injury, therefore nephrology has recommended renal biopsy.    Overall, Melanie Hall reports she is in poor health. She has not followed with [facility] for several years due to financial restraints. She has since applied for Medicaid. She is unable to afford groceries. Her meals are provided by [employer]. She has four daughters that are helpful. She is currently ambulating with a walker and has not had a lot of activity since femur fracture. She has had a 20lb weight loss over the past mouth. She states she does not have an appetite. She is unsure of her family history.     Review of Systems  Constitutional:  Positive for appetite change and fatigue.  HENT:  Negative.   Eyes: Negative.   Respiratory: Negative.   Cardiovascular: Negative.   Gastrointestinal: Positive for diarrhea.  Endocrine: Negative.   Genitourinary: Negative.    Musculoskeletal: Positive for arthralgias, back pain, gait problem and myalgias.  Skin: Negative.   Neurological: Positive for extremity weakness and gait problem.  Hematological: Negative.   Psychiatric/Behavioral: Positive for depression.    MEDICAL HISTORY: Past Medical History:  Diagnosis Date  . Anemia   . Cataracts, bilateral    surgery to remove  . CKD (chronic kidney disease)   . Diabetes mellitus    type 2  . GERD (gastroesophageal reflux disease)   . Hypertension   . SVD (spontaneous vaginal delivery)    x 4    SURGICAL HISTORY: Past Surgical History:  Procedure Laterality Date  . EYE SURGERY Bilateral    cataracts removed  . FEMUR IM NAIL Left 10/28/2018   Procedure: RETROGRADE FEMORAL NAILING;  Surgeon: Meredith Pel, MD;  Location: [facility];  Service: Orthopedics;  Laterality: Left;  . IM NAILING FEMORAL SHAFT RETROGRADE Left 10/28/2018  . KNEE SURGERY    . TUBAL LIGATION      SOCIAL HISTORY: Social History   Socioeconomic History  . Marital status: Widowed    Spouse name: Not on file  . Number of children: 4  . Years of education: Not on file  . Highest education level: Not on file  Occupational History  . Not on file  Tobacco Use  .  Smoking status: Former Smoker    Types: Cigarettes    Quit date: 1997    Years since quitting: 24.0  . Smokeless tobacco: Never Used  Substance and Sexual Activity  . Alcohol use: No  . Drug use: No  . Sexual activity: Yes    Birth control/protection: Surgical  Other Topics Concern  . Not on file  Social History Narrative  . Not on file   Social Determinants of Health   Financial Resource Strain:   . Difficulty of Paying Living Expenses: Not on file  Food Insecurity:   .  Worried About Charity fundraiser in the Last Year: Not on file  . Ran Out of Food in the Last Year: Not on file  Transportation Needs:   . Lack of Transportation (Medical): Not on file  . Lack of Transportation (Non-Medical): Not on file  Physical Activity:   . Days of Exercise per Week: Not on file  . Minutes of Exercise per Session: Not on file  Stress:   . Feeling of Stress : Not on file  Social Connections:   . Frequency of Communication with Friends and Family: Not on file  . Frequency of Social Gatherings with Friends and Family: Not on file  . Attends Religious Services: Not on file  . Active Member of Clubs or Organizations: Not on file  . Attends Archivist Meetings: Not on file  . Marital Status: Not on file  Intimate Partner Violence:   . Fear of Current or Ex-Partner: Not on file  . Emotionally Abused: Not on file  . Physically Abused: Not on file  . Sexually Abused: Not on file    FAMILY HISTORY Family History  Problem Relation Age of Onset  . Diabetes Mother   . Hypertension Mother   . Bipolar disorder Mother   . Brain cancer Maternal Aunt   . Heart disease Maternal Grandmother   . Diabetes Maternal Grandmother   . Breast cancer Maternal Aunt   . Diabetes Sister     ALLERGIES:  has No Known Allergies.  MEDICATIONS:  Current Outpatient Medications  Medication Sig Dispense Refill  . amLODipine (NORVASC) 5 MG tablet Take 1 tablet (5 mg total) by mouth daily. 30 tablet 2  . carvedilol (COREG) 3.125 MG tablet Take by mouth.    Marland Kitchen epoetin alfa (EPOGEN) 3000 UNIT/ML injection Inject into the skin.    Marland Kitchen gabapentin (NEURONTIN) 100 MG capsule Take 1 capsule (100 mg total) by mouth 2 (two) times daily. 60 capsule 1  . insulin aspart protamine- aspart (NOVOLOG MIX 70/30) (70-30) 100 UNIT/ML injection Inject 15 Units into the skin 3 (three) times daily before meals.    . lovastatin (MEVACOR) 20 MG tablet Take 40 mg by mouth at bedtime.    . pantoprazole  (PROTONIX) 40 MG tablet Take 1 tablet (40 mg total) by mouth daily. 30 tablet 1  . sodium bicarbonate 650 MG tablet Take 650 mg by mouth 3 (three) times daily.    Marland Kitchen acetaminophen (TYLENOL) 325 MG tablet Take 2 tablets (650 mg total) by mouth every 6 (six) hours as needed for mild pain or headache (or Fever >/= 101). (Patient not taking: Reported on 07/23/2019) 40 tablet 0  . amLODipine (NORVASC) 5 MG tablet Take by mouth.    Marland Kitchen aspirin EC 81 MG EC tablet Take 1 tablet (81 mg total) by mouth 2 (two) times daily. (Patient not taking: Reported on 02/28/2019) 60 tablet 0  . blood glucose meter  kit and supplies KIT Dispense based on patient and insurance preference. Use to check blood sugar three times daily as directed. (FOR ICD-9 250.00, 250.01). (Patient not taking: Reported on 02/28/2019) 1 each 0  . levothyroxine (SYNTHROID) 25 MCG tablet Take 25 mcg by mouth daily.     No current facility-administered medications for this visit.   Facility-Administered Medications Ordered in Other Visits  Medication Dose Route Frequency Provider Last Rate Last Admin  . epoetin alfa (EPOGEN) injection 3,000 Units  3,000 Units Subcutaneous Once Bhutani, Manpreet S, MD        PHYSICAL EXAMINATION:  ECOG PERFORMANCE STATUS: 2 - Symptomatic, <50% confined to bed   Vitals:   07/23/19 0922  BP: (!) 183/101  Pulse: 89  Resp: 18  Temp: 97.8 F (36.6 C)  SpO2: 100%    Filed Weights   07/23/19 0922  Weight: 148 lb 4.8 oz (67.3 kg)     Physical Exam Constitutional:      Appearance: Normal appearance.  HENT:     Head: Normocephalic.     Right Ear: External ear normal.     Left Ear: External ear normal.     Nose: Nose normal.     Mouth/Throat:     Pharynx: Oropharynx is clear.  Eyes:     Conjunctiva/sclera: Conjunctivae normal.  Cardiovascular:     Rate and Rhythm: Normal rate and regular rhythm.     Pulses: Normal pulses.     Heart sounds: Normal heart sounds.  Pulmonary:     Effort: Pulmonary  effort is normal.     Breath sounds: Normal breath sounds.  Abdominal:     General: Bowel sounds are normal.  Musculoskeletal:        General: Normal range of motion.     Cervical back: Normal range of motion.     Comments: Decreased ROM, recent left femur fracture  Skin:    General: Skin is warm.  Neurological:     General: No focal deficit present.     Mental Status: She is alert and oriented to person, place, and time.  Psychiatric:        Mood and Affect: Mood normal.        Behavior: Behavior normal.      LABORATORY DATA: I have personally reviewed the data as listed:  Appointment on 07/23/2019  Component Date Value Ref Range Status  . WBC 07/23/2019 8.3  4.0 - 10.5 K/uL Final  . RBC 07/23/2019 3.23* 3.87 - 5.11 MIL/uL Final  . Hemoglobin 07/23/2019 9.9* 12.0 - 15.0 g/dL Final  . HCT 07/23/2019 29.6* 36.0 - 46.0 % Final  . MCV 07/23/2019 91.6  80.0 - 100.0 fL Final  . MCH 07/23/2019 30.7  26.0 - 34.0 pg Final  . MCHC 07/23/2019 33.4  30.0 - 36.0 g/dL Final  . RDW 07/23/2019 13.0  11.5 - 15.5 % Final  . Platelets 07/23/2019 208  150 - 400 K/uL Final  . nRBC 07/23/2019 0.0  0.0 - 0.2 % Final  . Neutrophils Relative % 07/23/2019 62  % Final  . Neutro Abs 07/23/2019 5.2  1.7 - 7.7 K/uL Final  . Lymphocytes Relative 07/23/2019 23  % Final  . Lymphs Abs 07/23/2019 1.9  0.7 - 4.0 K/uL Final  . Monocytes Relative 07/23/2019 9  % Final  . Monocytes Absolute 07/23/2019 0.7  0.1 - 1.0 K/uL Final  . Eosinophils Relative 07/23/2019 4  % Final  . Eosinophils Absolute 07/23/2019 0.3  0.0 - 0.5 K/uL  Final  . Basophils Relative 07/23/2019 1  % Final  . Basophils Absolute 07/23/2019 0.1  0.0 - 0.1 K/uL Final  . Immature Granulocytes 07/23/2019 1  % Final  . Abs Immature Granulocytes 07/23/2019 0.04  0.00 - 0.07 K/uL Final   Performed at [facility], [address] [place], [place] [address]  . Sodium 07/23/2019 137  135 - 145 mmol/L Final  . Potassium 07/23/2019 4.1  3.5 - 5.1  mmol/L Final  . Chloride 07/23/2019 109  98 - 111 mmol/L Final  . CO2 07/23/2019 19* 22 - 32 mmol/L Final  . Glucose, Bld 07/23/2019 146* 70 - 99 mg/dL Final  . BUN 07/23/2019 40* 6 - 20 mg/dL Final  . Creatinine, Ser 07/23/2019 5.19* 0.44 - 1.00 mg/dL Final  . Calcium 07/23/2019 8.4* 8.9 - 10.3 mg/dL Final  . Total Protein 07/23/2019 7.3  6.5 - 8.1 g/dL Final  . Albumin 07/23/2019 3.0* 3.5 - 5.0 g/dL Final  . AST 07/23/2019 12* 15 - 41 U/L Final  . ALT 07/23/2019 11  0 - 44 U/L Final  . Alkaline Phosphatase 07/23/2019 100  38 - 126 U/L Final  . Total Bilirubin 07/23/2019 0.9  0.3 - 1.2 mg/dL Final  . GFR calc non Af Amer 07/23/2019 9* >60 mL/min Final  . GFR calc Af Amer 07/23/2019 10* >60 mL/min Final  . Anion gap 07/23/2019 9  5 - 15 Final   Performed at [facility], [address], [place], [place] [address]  . LDH 07/23/2019 226* 98 - 192 U/L Final   Performed at [facility], [address], [place], [place] [address]     ASSESSMENT/PLAN   MGUS (monoclonal gammopathy of unknown significance) 1.  Monoclonal gammopathy of undetermined significance - Mechanical fall in April 2020 that resulted in a fractured left femur.  Status post IM nailing.  She required 1 unit of packed red blood cells postop for hemoglobin of 7.8.   -Worsening anemia and kidney function noted over the past year.  Of note, patient serum creatinine in February 2020 was 0.98 with normal GFR, most recently serum creatinine noted to be 3.53 with a GFR of 14.   -November 2020 : protein electrophoresis revealed an M spike of 0.2 g/dL, kappa free light chains elevated at 130.4 mg/L, lambda light chains elevated at 55.2, and with a ratio of 2.36.  Ferritin and iron studies were within normal.  ANA panel was positive.  Viral work-up was negative.  -Discussed findings with patient in details.  There is concern for plasma cell disorder, such as Multiple Myeloma as she was found to have serum monoclonal protein, elevated  free light chains, and recent fracture of left femur.  Patient also has anemia and elevated serum creatinine.  Nonetheless, we need to complete additional work-up to determine diagnosis.  We will repeat myeloma labs.  Recommend skeletal survey.  Furthermore, recommend bone density scan.  Agree with nephrology kidney biopsy is warranted due to acute kidney injury.  We will see the patient back in 2 weeks to discuss diagnosis and possible treatment plans.  Addendum: -I have independently elicited history and examined patient.  I agree with the assessment and plan written by my nurse practitioner Carver Fila, FNP.  She had a fall at home resulted in a fracture requiring ORIF.  Recently noted to have nephrotic range proteinuria.  Also found to have M spike of 0.2 g.  Light chain ratio is mildly elevated at 2.36.  Would recommend a skeletal  survey and a repeat of myeloma labs including immunofixation.  24-hour urine immunofixation was negative.  Would recommend kidney biopsy with amyloid stains.  I would also recommend a bone density test.    Orders Placed This Encounter  Procedures  . DG Bone Survey Met    Standing Status:   Future    Number of Occurrences:   1    Standing Expiration Date:   09/19/2020    Order Specific Question:   Reason for Exam (SYMPTOM  OR DIAGNOSIS REQUIRED)    Answer:   MGUS    Order Specific Question:   Is patient pregnant?    Answer:   No    Order Specific Question:   Preferred imaging location?    Answer:   [facility]    Order Specific Question:   Radiology Contrast Protocol - do NOT remove file path    Answer:   [email or web address]  . DG Bone Density    Standing Status:   Future    Standing Expiration Date:   07/22/2020    Order Specific Question:   Reason for Exam (SYMPTOM  OR DIAGNOSIS REQUIRED)    Answer:   MGUS    Order Specific Question:   Is the patient pregnant?    Answer:   No    Order Specific Question:    Preferred imaging location?    Answer:   [facility]  . CBC with Differential    Standing Status:   Future    Number of Occurrences:   1    Standing Expiration Date:   07/22/2020  . Comprehensive metabolic panel    Standing Status:   Future    Number of Occurrences:   1    Standing Expiration Date:   07/22/2020  . Lactate dehydrogenase    Standing Status:   Future    Number of Occurrences:   1    Standing Expiration Date:   07/22/2020  . Multiple Myeloma Panel (SPEP&IFE w/QIG)    Standing Status:   Future    Number of Occurrences:   1    Standing Expiration Date:   07/22/2020  . Kappa/lambda light chains    Standing Status:   Future    Number of Occurrences:   1    Standing Expiration Date:   07/22/2020  . Beta 2 microglobuline, serum    Standing Status:   Future    Number of Occurrences:   1    Standing Expiration Date:   07/22/2020  . IgG, IgA, IgM    Standing Status:   Future    Number of Occurrences:   1    Standing Expiration Date:   07/22/2020  . Immunofixation electrophoresis    Standing Status:   Future    Number of Occurrences:   1    Standing Expiration Date:   07/22/2020    All questions were answered. The patient knows to call the clinic with any problems, questions or concerns.  This note was electronically signed.    Melanie Jack, MD  07/23/2019 7:05 PM

## 2019-07-24 ENCOUNTER — Other Ambulatory Visit (HOSPITAL_COMMUNITY): Payer: Self-pay | Admitting: Nephrology

## 2019-07-24 DIAGNOSIS — I10 Essential (primary) hypertension: Secondary | ICD-10-CM

## 2019-07-24 DIAGNOSIS — N178 Other acute kidney failure: Secondary | ICD-10-CM

## 2019-07-24 DIAGNOSIS — N182 Chronic kidney disease, stage 2 (mild): Secondary | ICD-10-CM

## 2019-07-24 DIAGNOSIS — E872 Acidosis, unspecified: Secondary | ICD-10-CM

## 2019-07-24 DIAGNOSIS — E213 Hyperparathyroidism, unspecified: Secondary | ICD-10-CM

## 2019-07-24 DIAGNOSIS — E559 Vitamin D deficiency, unspecified: Secondary | ICD-10-CM

## 2019-07-24 DIAGNOSIS — N185 Chronic kidney disease, stage 5: Secondary | ICD-10-CM

## 2019-07-24 LAB — KAPPA/LAMBDA LIGHT CHAINS
Kappa free light chain: 180.3 mg/L — ABNORMAL HIGH (ref 3.3–19.4)
Kappa, lambda light chain ratio: 2.47 — ABNORMAL HIGH (ref 0.26–1.65)
Lambda free light chains: 73 mg/L — ABNORMAL HIGH (ref 5.7–26.3)

## 2019-07-24 LAB — IMMUNOFIXATION ELECTROPHORESIS
IgA: 311 mg/dL (ref 87–352)
IgG (Immunoglobin G), Serum: 1147 mg/dL (ref 586–1602)
IgM (Immunoglobulin M), Srm: 160 mg/dL (ref 26–217)
Total Protein ELP: 6.4 g/dL (ref 6.0–8.5)

## 2019-07-24 LAB — IGG, IGA, IGM
IgA: 305 mg/dL (ref 87–352)
IgG (Immunoglobin G), Serum: 1156 mg/dL (ref 586–1602)
IgM (Immunoglobulin M), Srm: 162 mg/dL (ref 26–217)

## 2019-07-24 LAB — BETA 2 MICROGLOBULIN, SERUM: Beta-2 Microglobulin: 11.2 mg/L — ABNORMAL HIGH (ref 0.6–2.4)

## 2019-07-28 LAB — MULTIPLE MYELOMA PANEL, SERUM
Albumin SerPl Elph-Mcnc: 2.9 g/dL (ref 2.9–4.4)
Albumin/Glob SerPl: 0.9 (ref 0.7–1.7)
Alpha 1: 0.3 g/dL (ref 0.0–0.4)
Alpha2 Glob SerPl Elph-Mcnc: 0.9 g/dL (ref 0.4–1.0)
B-Globulin SerPl Elph-Mcnc: 1.1 g/dL (ref 0.7–1.3)
Gamma Glob SerPl Elph-Mcnc: 1.2 g/dL (ref 0.4–1.8)
Globulin, Total: 3.4 g/dL (ref 2.2–3.9)
IgA: 312 mg/dL (ref 87–352)
IgG (Immunoglobin G), Serum: 1085 mg/dL (ref 586–1602)
IgM (Immunoglobulin M), Srm: 162 mg/dL (ref 26–217)
Total Protein ELP: 6.3 g/dL (ref 6.0–8.5)

## 2019-07-30 ENCOUNTER — Other Ambulatory Visit: Payer: Self-pay

## 2019-07-30 ENCOUNTER — Ambulatory Visit (HOSPITAL_COMMUNITY)
Admission: RE | Admit: 2019-07-30 | Discharge: 2019-07-30 | Disposition: A | Discharge status: Home / Self Care | Payer: Medicaid Other | Source: Ambulatory Visit | Attending: Hematology | Admitting: Hematology

## 2019-07-30 ENCOUNTER — Other Ambulatory Visit (HOSPITAL_COMMUNITY): Payer: Self-pay

## 2019-07-30 DIAGNOSIS — D472 Monoclonal gammopathy: Secondary | ICD-10-CM | POA: Insufficient documentation

## 2019-08-06 ENCOUNTER — Encounter (HOSPITAL_COMMUNITY)
Admission: RE | Admit: 2019-08-06 | Discharge: 2019-08-06 | Disposition: A | Discharge status: Home / Self Care | Payer: Medicaid Other | Source: Ambulatory Visit | Attending: Nephrology | Admitting: Nephrology

## 2019-08-06 ENCOUNTER — Other Ambulatory Visit: Payer: Self-pay

## 2019-08-06 ENCOUNTER — Encounter (HOSPITAL_COMMUNITY): Payer: Self-pay

## 2019-08-06 DIAGNOSIS — N185 Chronic kidney disease, stage 5: Secondary | ICD-10-CM | POA: Insufficient documentation

## 2019-08-06 DIAGNOSIS — D631 Anemia in chronic kidney disease: Secondary | ICD-10-CM | POA: Insufficient documentation

## 2019-08-06 MED ORDER — EPOETIN ALFA 3000 UNIT/ML IJ SOLN: 3000.0000 [IU] | Freq: Once | INTRAMUSCULAR | Status: DC

## 2019-08-06 NOTE — Progress Notes (Signed)
Patient presents today stating 10/10 mid sternal chest pain and upper mid back pain.  BP taken and repeated for verification and is 205/114. Epogen not given due to complaint and associated blood pressure.  Stated that she took her BP medication this am, however after speaking with her daughter, she has been out of all of her BP medications and they were to pick them up at the health department today.  Given her symptoms and vital signs, she and her daughter were advised that she needed immediate medical attention to get BP down and assess other symptoms by ER physician, which patient refused to do and said "I am fine and am going to the health department, not the ER".  I emphasized the importance of the above and the fact that her injection two weeks ago was not given for elevated BP, as well and must be addressed ASAP.  Melanie Hall notified at Dr. Toya Smothers office and agreed with advice given.

## 2019-08-07 ENCOUNTER — Encounter (HOSPITAL_COMMUNITY): Payer: Self-pay

## 2019-08-07 ENCOUNTER — Encounter (HOSPITAL_COMMUNITY): Admission: RE | Admit: 2019-08-07 | Payer: Self-pay | Source: Ambulatory Visit

## 2019-08-07 LAB — POCT HEMOGLOBIN-HEMACUE: Hemoglobin: 9.5 g/dL — ABNORMAL LOW (ref 12.0–15.0)

## 2019-08-08 ENCOUNTER — Other Ambulatory Visit: Payer: Self-pay

## 2019-08-08 ENCOUNTER — Inpatient Hospital Stay (HOSPITAL_COMMUNITY)
Admission: EM | Admit: 2019-08-08 | Discharge: 2019-08-25 | DRG: 674 | Disposition: A | Discharge status: Home / Self Care | Payer: Medicaid Other | Attending: Internal Medicine | Admitting: Internal Medicine

## 2019-08-08 ENCOUNTER — Encounter (HOSPITAL_COMMUNITY): Payer: Self-pay | Admitting: Emergency Medicine

## 2019-08-08 ENCOUNTER — Emergency Department (HOSPITAL_COMMUNITY): Payer: Medicaid Other

## 2019-08-08 DIAGNOSIS — Y848 Other medical procedures as the cause of abnormal reaction of the patient, or of later complication, without mention of misadventure at the time of the procedure: Secondary | ICD-10-CM | POA: Diagnosis not present

## 2019-08-08 DIAGNOSIS — N179 Acute kidney failure, unspecified: Secondary | ICD-10-CM | POA: Diagnosis present

## 2019-08-08 DIAGNOSIS — Z9842 Cataract extraction status, left eye: Secondary | ICD-10-CM

## 2019-08-08 DIAGNOSIS — N189 Chronic kidney disease, unspecified: Secondary | ICD-10-CM | POA: Clinically undetermined

## 2019-08-08 DIAGNOSIS — B1910 Unspecified viral hepatitis B without hepatic coma: Secondary | ICD-10-CM | POA: Diagnosis not present

## 2019-08-08 DIAGNOSIS — N19 Unspecified kidney failure: Secondary | ICD-10-CM

## 2019-08-08 DIAGNOSIS — Z9841 Cataract extraction status, right eye: Secondary | ICD-10-CM | POA: Diagnosis not present

## 2019-08-08 DIAGNOSIS — R159 Full incontinence of feces: Secondary | ICD-10-CM | POA: Diagnosis not present

## 2019-08-08 DIAGNOSIS — J90 Pleural effusion, not elsewhere classified: Secondary | ICD-10-CM | POA: Diagnosis present

## 2019-08-08 DIAGNOSIS — M25551 Pain in right hip: Secondary | ICD-10-CM | POA: Diagnosis present

## 2019-08-08 DIAGNOSIS — E7800 Pure hypercholesterolemia, unspecified: Secondary | ICD-10-CM | POA: Diagnosis not present

## 2019-08-08 DIAGNOSIS — R109 Unspecified abdominal pain: Secondary | ICD-10-CM

## 2019-08-08 DIAGNOSIS — Z808 Family history of malignant neoplasm of other organs or systems: Secondary | ICD-10-CM

## 2019-08-08 DIAGNOSIS — G459 Transient cerebral ischemic attack, unspecified: Secondary | ICD-10-CM | POA: Diagnosis not present

## 2019-08-08 DIAGNOSIS — M879 Osteonecrosis, unspecified: Secondary | ICD-10-CM | POA: Diagnosis not present

## 2019-08-08 DIAGNOSIS — D509 Iron deficiency anemia, unspecified: Secondary | ICD-10-CM | POA: Diagnosis present

## 2019-08-08 DIAGNOSIS — R110 Nausea: Secondary | ICD-10-CM | POA: Diagnosis not present

## 2019-08-08 DIAGNOSIS — Z794 Long term (current) use of insulin: Secondary | ICD-10-CM

## 2019-08-08 DIAGNOSIS — D62 Acute posthemorrhagic anemia: Secondary | ICD-10-CM | POA: Diagnosis not present

## 2019-08-08 DIAGNOSIS — R339 Retention of urine, unspecified: Secondary | ICD-10-CM | POA: Diagnosis not present

## 2019-08-08 DIAGNOSIS — Z818 Family history of other mental and behavioral disorders: Secondary | ICD-10-CM

## 2019-08-08 DIAGNOSIS — M4856XA Collapsed vertebra, not elsewhere classified, lumbar region, initial encounter for fracture: Secondary | ICD-10-CM | POA: Diagnosis present

## 2019-08-08 DIAGNOSIS — N99840 Postprocedural hematoma of a genitourinary system organ or structure following a genitourinary system procedure: Secondary | ICD-10-CM | POA: Diagnosis not present

## 2019-08-08 DIAGNOSIS — E8770 Fluid overload, unspecified: Secondary | ICD-10-CM | POA: Diagnosis present

## 2019-08-08 DIAGNOSIS — I120 Hypertensive chronic kidney disease with stage 5 chronic kidney disease or end stage renal disease: Secondary | ICD-10-CM | POA: Diagnosis not present

## 2019-08-08 DIAGNOSIS — E1140 Type 2 diabetes mellitus with diabetic neuropathy, unspecified: Secondary | ICD-10-CM | POA: Diagnosis present

## 2019-08-08 DIAGNOSIS — R531 Weakness: Secondary | ICD-10-CM | POA: Diagnosis not present

## 2019-08-08 DIAGNOSIS — G43909 Migraine, unspecified, not intractable, without status migrainosus: Secondary | ICD-10-CM | POA: Diagnosis not present

## 2019-08-08 DIAGNOSIS — Z8249 Family history of ischemic heart disease and other diseases of the circulatory system: Secondary | ICD-10-CM

## 2019-08-08 DIAGNOSIS — I313 Pericardial effusion (noninflammatory): Secondary | ICD-10-CM | POA: Diagnosis not present

## 2019-08-08 DIAGNOSIS — E1122 Type 2 diabetes mellitus with diabetic chronic kidney disease: Secondary | ICD-10-CM | POA: Diagnosis not present

## 2019-08-08 DIAGNOSIS — K219 Gastro-esophageal reflux disease without esophagitis: Secondary | ICD-10-CM | POA: Diagnosis present

## 2019-08-08 DIAGNOSIS — E039 Hypothyroidism, unspecified: Secondary | ICD-10-CM | POA: Diagnosis not present

## 2019-08-08 DIAGNOSIS — Z20822 Contact with and (suspected) exposure to covid-19: Secondary | ICD-10-CM | POA: Diagnosis not present

## 2019-08-08 DIAGNOSIS — T508X5A Adverse effect of diagnostic agents, initial encounter: Secondary | ICD-10-CM | POA: Diagnosis not present

## 2019-08-08 DIAGNOSIS — E872 Acidosis: Secondary | ICD-10-CM | POA: Diagnosis not present

## 2019-08-08 DIAGNOSIS — N170 Acute kidney failure with tubular necrosis: Principal | ICD-10-CM | POA: Diagnosis present

## 2019-08-08 DIAGNOSIS — D472 Monoclonal gammopathy: Secondary | ICD-10-CM | POA: Diagnosis not present

## 2019-08-08 DIAGNOSIS — N049 Nephrotic syndrome with unspecified morphologic changes: Secondary | ICD-10-CM | POA: Diagnosis not present

## 2019-08-08 DIAGNOSIS — Z87891 Personal history of nicotine dependence: Secondary | ICD-10-CM

## 2019-08-08 DIAGNOSIS — G4700 Insomnia, unspecified: Secondary | ICD-10-CM | POA: Diagnosis not present

## 2019-08-08 DIAGNOSIS — D631 Anemia in chronic kidney disease: Secondary | ICD-10-CM | POA: Diagnosis not present

## 2019-08-08 DIAGNOSIS — Z803 Family history of malignant neoplasm of breast: Secondary | ICD-10-CM

## 2019-08-08 DIAGNOSIS — R5381 Other malaise: Secondary | ICD-10-CM | POA: Diagnosis not present

## 2019-08-08 DIAGNOSIS — I071 Rheumatic tricuspid insufficiency: Secondary | ICD-10-CM | POA: Diagnosis present

## 2019-08-08 DIAGNOSIS — M25552 Pain in left hip: Secondary | ICD-10-CM | POA: Diagnosis present

## 2019-08-08 DIAGNOSIS — Z833 Family history of diabetes mellitus: Secondary | ICD-10-CM

## 2019-08-08 DIAGNOSIS — E785 Hyperlipidemia, unspecified: Secondary | ICD-10-CM | POA: Diagnosis present

## 2019-08-08 DIAGNOSIS — E1143 Type 2 diabetes mellitus with diabetic autonomic (poly)neuropathy: Secondary | ICD-10-CM | POA: Diagnosis present

## 2019-08-08 DIAGNOSIS — R0789 Other chest pain: Secondary | ICD-10-CM

## 2019-08-08 DIAGNOSIS — R748 Abnormal levels of other serum enzymes: Secondary | ICD-10-CM | POA: Diagnosis not present

## 2019-08-08 DIAGNOSIS — N186 End stage renal disease: Secondary | ICD-10-CM | POA: Diagnosis not present

## 2019-08-08 DIAGNOSIS — N141 Nephropathy induced by other drugs, medicaments and biological substances: Secondary | ICD-10-CM | POA: Diagnosis not present

## 2019-08-08 DIAGNOSIS — Z79899 Other long term (current) drug therapy: Secondary | ICD-10-CM

## 2019-08-08 DIAGNOSIS — Z7401 Bed confinement status: Secondary | ICD-10-CM | POA: Diagnosis not present

## 2019-08-08 DIAGNOSIS — M2550 Pain in unspecified joint: Secondary | ICD-10-CM | POA: Diagnosis not present

## 2019-08-08 DIAGNOSIS — G8929 Other chronic pain: Secondary | ICD-10-CM | POA: Diagnosis present

## 2019-08-08 DIAGNOSIS — K5900 Constipation, unspecified: Secondary | ICD-10-CM | POA: Diagnosis present

## 2019-08-08 DIAGNOSIS — I10 Essential (primary) hypertension: Secondary | ICD-10-CM | POA: Diagnosis present

## 2019-08-08 DIAGNOSIS — Z992 Dependence on renal dialysis: Secondary | ICD-10-CM | POA: Diagnosis not present

## 2019-08-08 DIAGNOSIS — R079 Chest pain, unspecified: Secondary | ICD-10-CM

## 2019-08-08 DIAGNOSIS — E1121 Type 2 diabetes mellitus with diabetic nephropathy: Secondary | ICD-10-CM | POA: Diagnosis present

## 2019-08-08 DIAGNOSIS — R29898 Other symptoms and signs involving the musculoskeletal system: Secondary | ICD-10-CM

## 2019-08-08 DIAGNOSIS — Z79890 Hormone replacement therapy (postmenopausal): Secondary | ICD-10-CM

## 2019-08-08 DIAGNOSIS — E11649 Type 2 diabetes mellitus with hypoglycemia without coma: Secondary | ICD-10-CM | POA: Diagnosis not present

## 2019-08-08 DIAGNOSIS — E875 Hyperkalemia: Secondary | ICD-10-CM | POA: Diagnosis not present

## 2019-08-08 HISTORY — DX: Other symptoms and signs involving the musculoskeletal system: R29.898

## 2019-08-08 LAB — CBC WITH DIFFERENTIAL/PLATELET
Abs Immature Granulocytes: 0.13 10*3/uL — ABNORMAL HIGH (ref 0.00–0.07)
Basophils Absolute: 0.1 10*3/uL (ref 0.0–0.1)
Basophils Relative: 1 %
Eosinophils Absolute: 0.1 10*3/uL (ref 0.0–0.5)
Eosinophils Relative: 1 %
HCT: 22.9 % — ABNORMAL LOW (ref 36.0–46.0)
Hemoglobin: 7.5 g/dL — ABNORMAL LOW (ref 12.0–15.0)
Immature Granulocytes: 1 %
Lymphocytes Relative: 11 %
Lymphs Abs: 1.3 10*3/uL (ref 0.7–4.0)
MCH: 31 pg (ref 26.0–34.0)
MCHC: 32.8 g/dL (ref 30.0–36.0)
MCV: 94.6 fL (ref 80.0–100.0)
Monocytes Absolute: 1.3 10*3/uL — ABNORMAL HIGH (ref 0.1–1.0)
Monocytes Relative: 11 %
Neutro Abs: 8.8 10*3/uL — ABNORMAL HIGH (ref 1.7–7.7)
Neutrophils Relative %: 75 %
Platelets: 193 10*3/uL (ref 150–400)
RBC: 2.42 MIL/uL — ABNORMAL LOW (ref 3.87–5.11)
RDW: 14 % (ref 11.5–15.5)
WBC: 11.7 10*3/uL — ABNORMAL HIGH (ref 4.0–10.5)
nRBC: 0 % (ref 0.0–0.2)

## 2019-08-08 LAB — COMPREHENSIVE METABOLIC PANEL
ALT: 47 U/L — ABNORMAL HIGH (ref 0–44)
AST: 52 U/L — ABNORMAL HIGH (ref 15–41)
Albumin: 2.5 g/dL — ABNORMAL LOW (ref 3.5–5.0)
Alkaline Phosphatase: 82 U/L (ref 38–126)
Anion gap: 11 (ref 5–15)
BUN: 73 mg/dL — ABNORMAL HIGH (ref 6–20)
CO2: 16 mmol/L — ABNORMAL LOW (ref 22–32)
Calcium: 8.1 mg/dL — ABNORMAL LOW (ref 8.9–10.3)
Chloride: 106 mmol/L (ref 98–111)
Creatinine, Ser: 6.4 mg/dL — ABNORMAL HIGH (ref 0.44–1.00)
GFR calc Af Amer: 8 mL/min — ABNORMAL LOW (ref >60)
GFR calc non Af Amer: 7 mL/min — ABNORMAL LOW (ref >60)
Glucose, Bld: 263 mg/dL — ABNORMAL HIGH (ref 70–99)
Potassium: 4.2 mmol/L (ref 3.5–5.1)
Sodium: 133 mmol/L — ABNORMAL LOW (ref 135–145)
Total Bilirubin: 1 mg/dL (ref 0.3–1.2)
Total Protein: 6.4 g/dL — ABNORMAL LOW (ref 6.5–8.1)

## 2019-08-08 LAB — TROPONIN I (HIGH SENSITIVITY)
Troponin I (High Sensitivity): 9 ng/L (ref <18)
Troponin I (High Sensitivity): 9 ng/L (ref <18)
Troponin I (High Sensitivity): 9 ng/L (ref <18)

## 2019-08-08 LAB — LIPASE, BLOOD: Lipase: 17 U/L (ref 11–51)

## 2019-08-08 LAB — RESPIRATORY PANEL BY RT PCR (FLU A&B, COVID)
Influenza A by PCR: NEGATIVE
Influenza B by PCR: NEGATIVE
SARS Coronavirus 2 by RT PCR: NEGATIVE

## 2019-08-08 LAB — BRAIN NATRIURETIC PEPTIDE: B Natriuretic Peptide: 279 pg/mL — ABNORMAL HIGH (ref 0.0–100.0)

## 2019-08-08 IMAGING — CT CT HEAD W/O CM
4 series · 17 of 47 positions shown, 19 images · non-contrast
Comparison: None.

CLINICAL DATA: Headache 2 days.  Hypertension.  Possible stroke.

EXAM:
CT HEAD WITHOUT CONTRAST
TECHNIQUE: Contiguous axial images were obtained from the base of the skull
through the vertex without intravenous contrast.

[Series 2: head w o · axial · 0.41mm/px · z∈[-0,+105]mm · 7 of 29 slices shown, 9 images]
[im 4/29  brain]
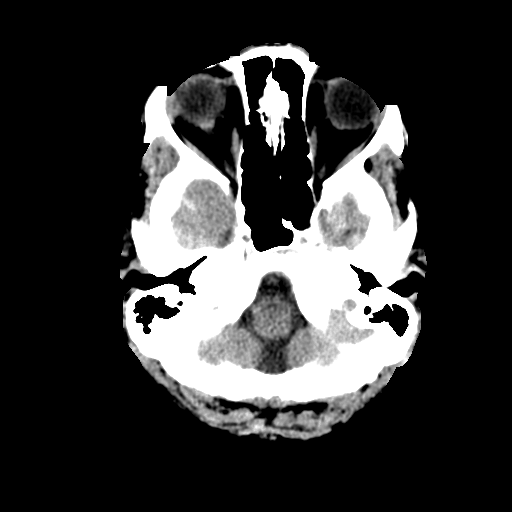
[im 4/29  bone]
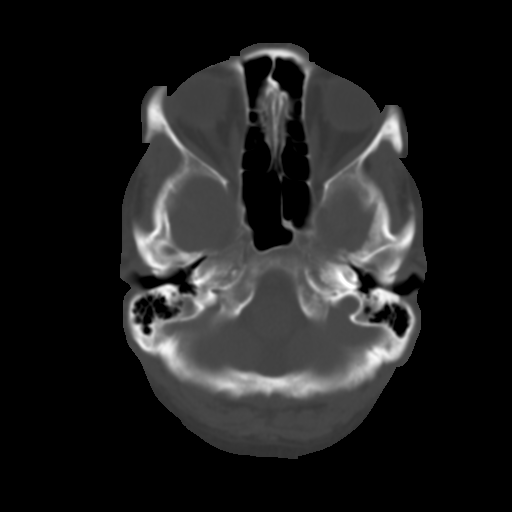
[im 8/29  brain]
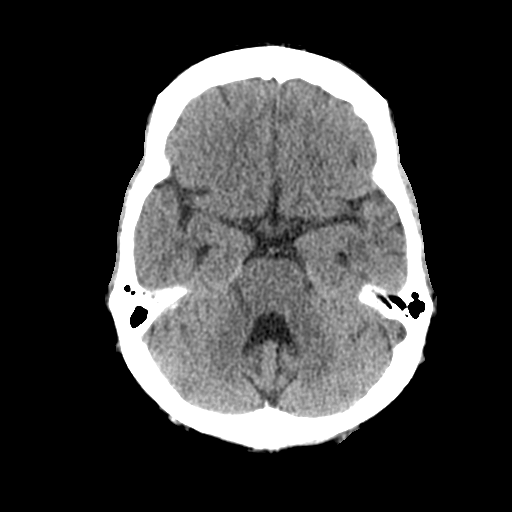
[im 11/29  brain]
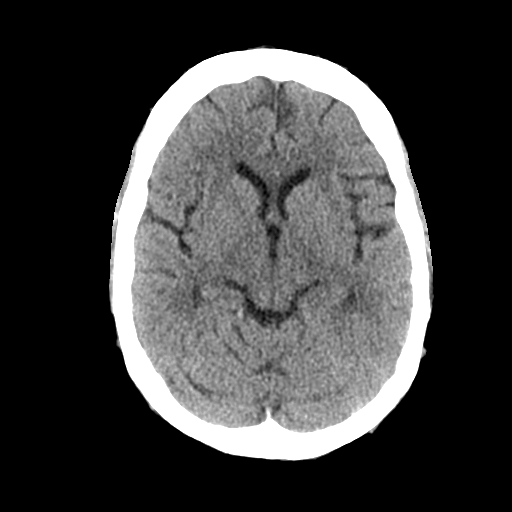
[im 15/29  brain]
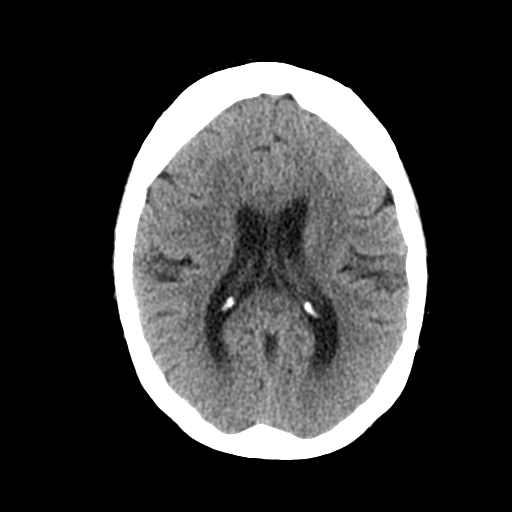
[im 18/29  brain]
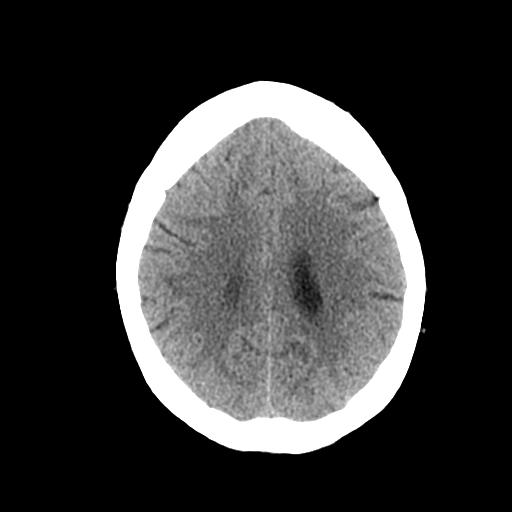
[im 18/29  bone]
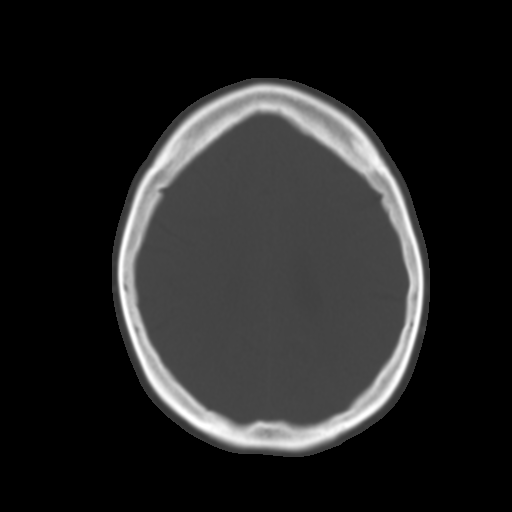
[im 22/29  brain]
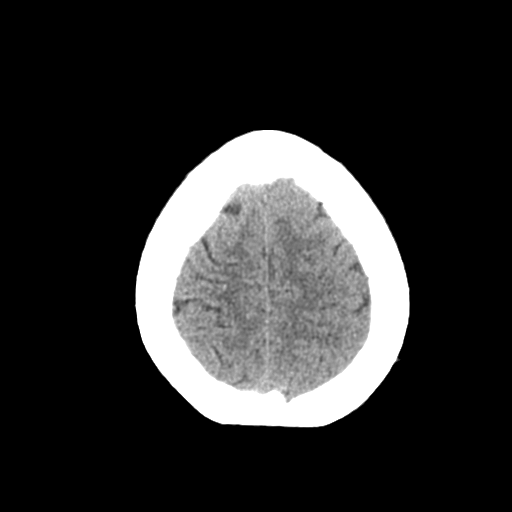
[im 25/29  brain]
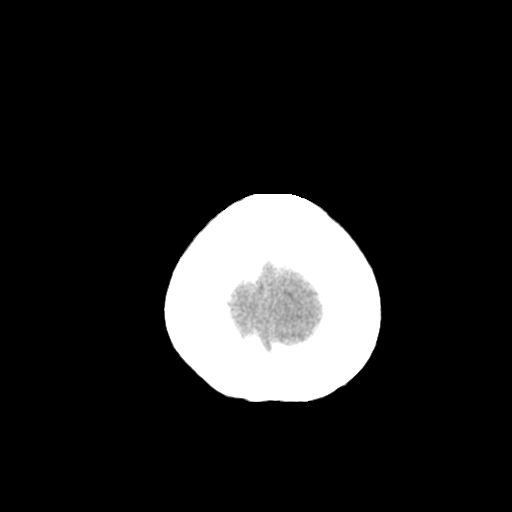

[Series 3: head bone · axial · 0.41mm/px · z∈[-1,+47]mm · 4 of 72 slices shown]
[im 8/72  bone]
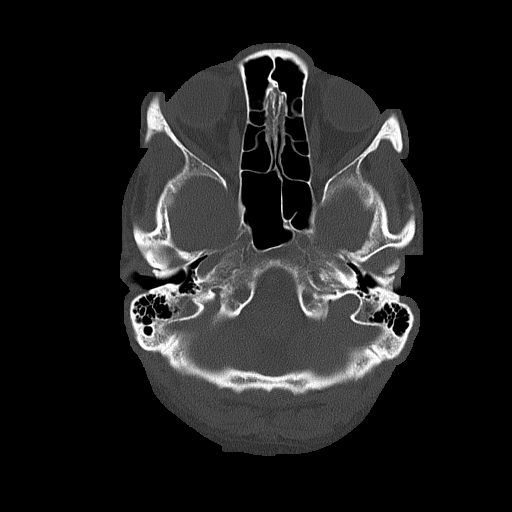
[im 15/72  bone]
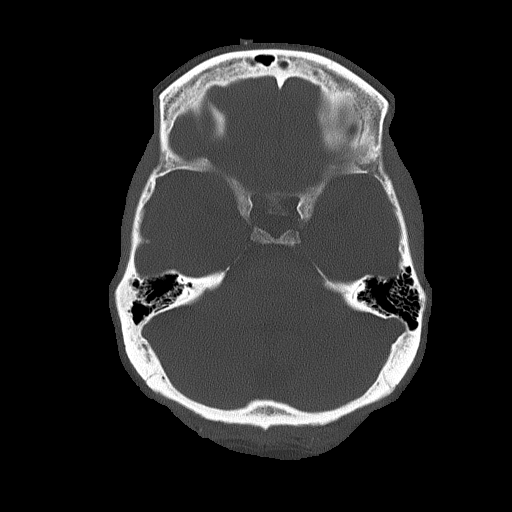
[im 22/72  bone]
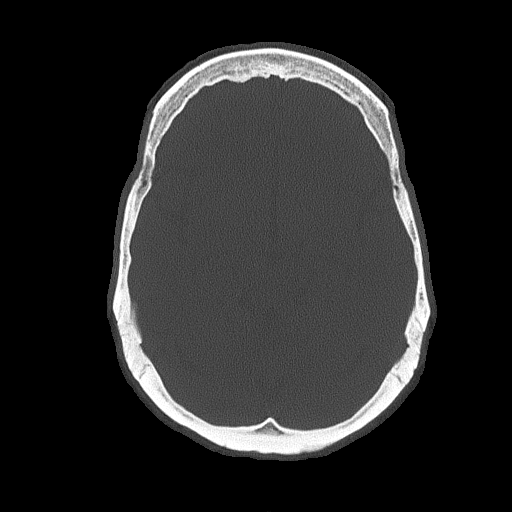
[im 32/72  bone]
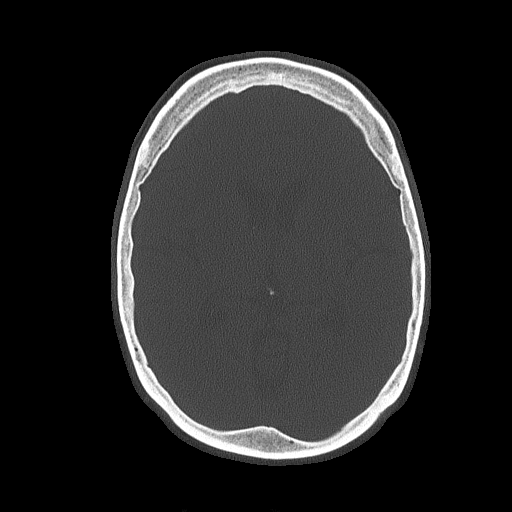

[Series 4: coronal soft · coronal · 0.30mm/px · 3 of 61 slices shown]
[im 21/61  brain]
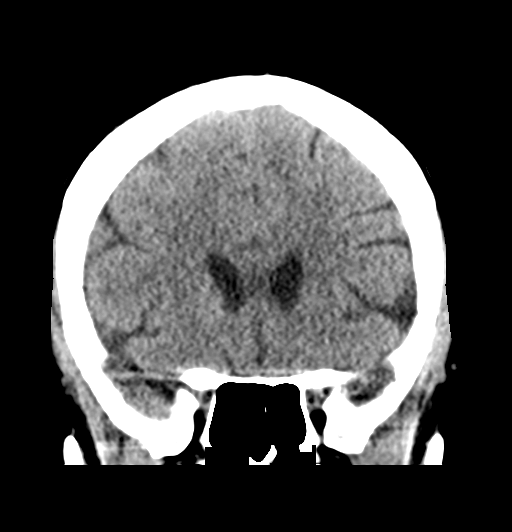
[im 27/61  brain]
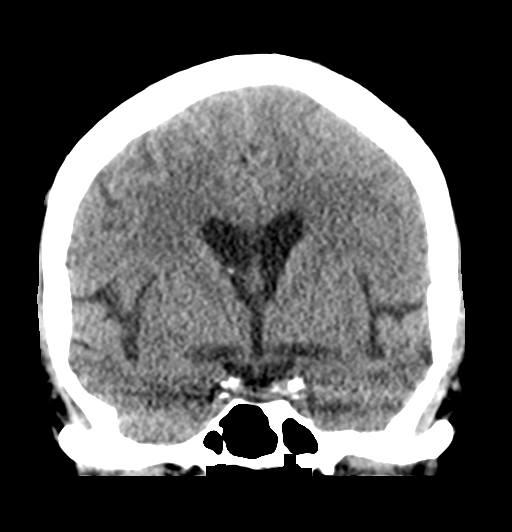
[im 34/61  brain]
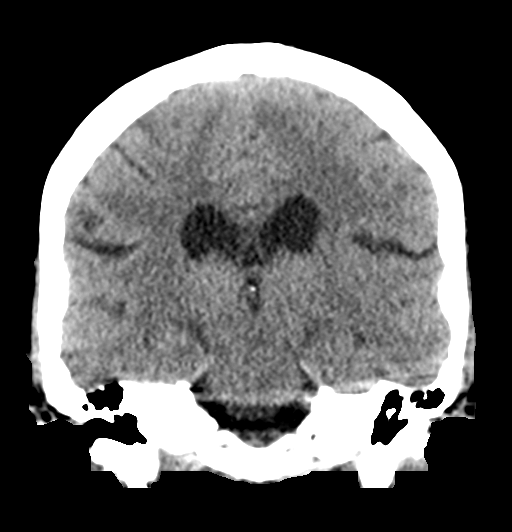

[Series 5: sagittal soft · sagittal · 0.31mm/px · 3 of 54 slices shown]
[im 18/54  brain]
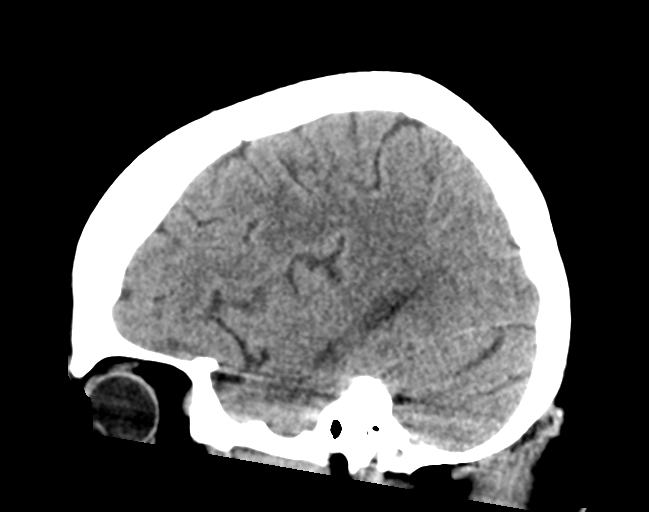
[im 27/54  brain]
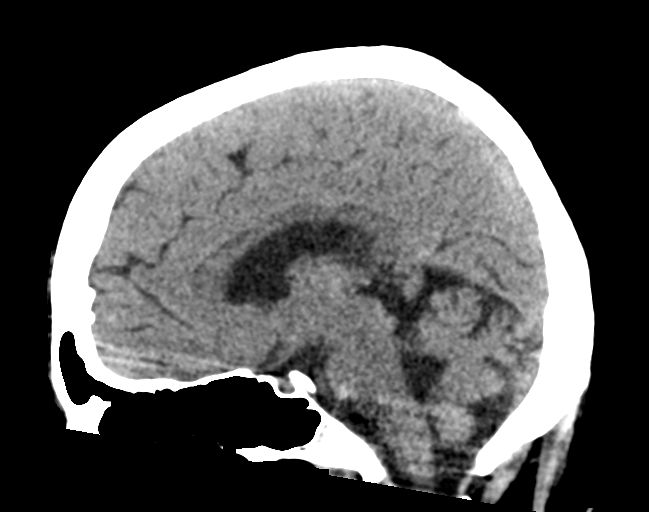
[im 36/54  brain]
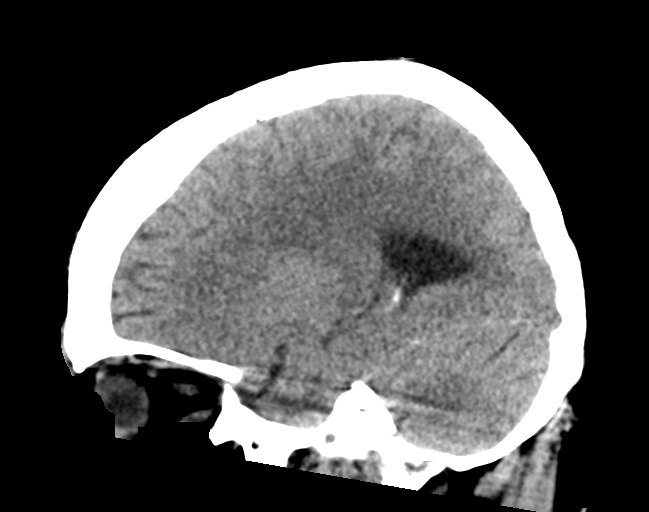

[17 of 47 positions shown; findings below may reference images not displayed]

FINDINGS: Brain: Ventricles, cisterns and other CSF spaces are normal. There
is no mass, mass effect, shift of midline structures or acute
hemorrhage. No evidence of acute infarction.

Vascular: No hyperdense vessel or unexpected calcification.

Skull: Normal. Negative for fracture or focal lesion.

Sinuses/Orbits: No acute finding.

Other: None.
IMPRESSION: No acute findings.

## 2019-08-08 IMAGING — DX DG CHEST 1V PORT
1 series · 1 of 1 positions shown · non-contrast
Comparison: [DATE]

CLINICAL DATA: Weakness

EXAM:
PORTABLE CHEST 1 VIEW

[chest ap]
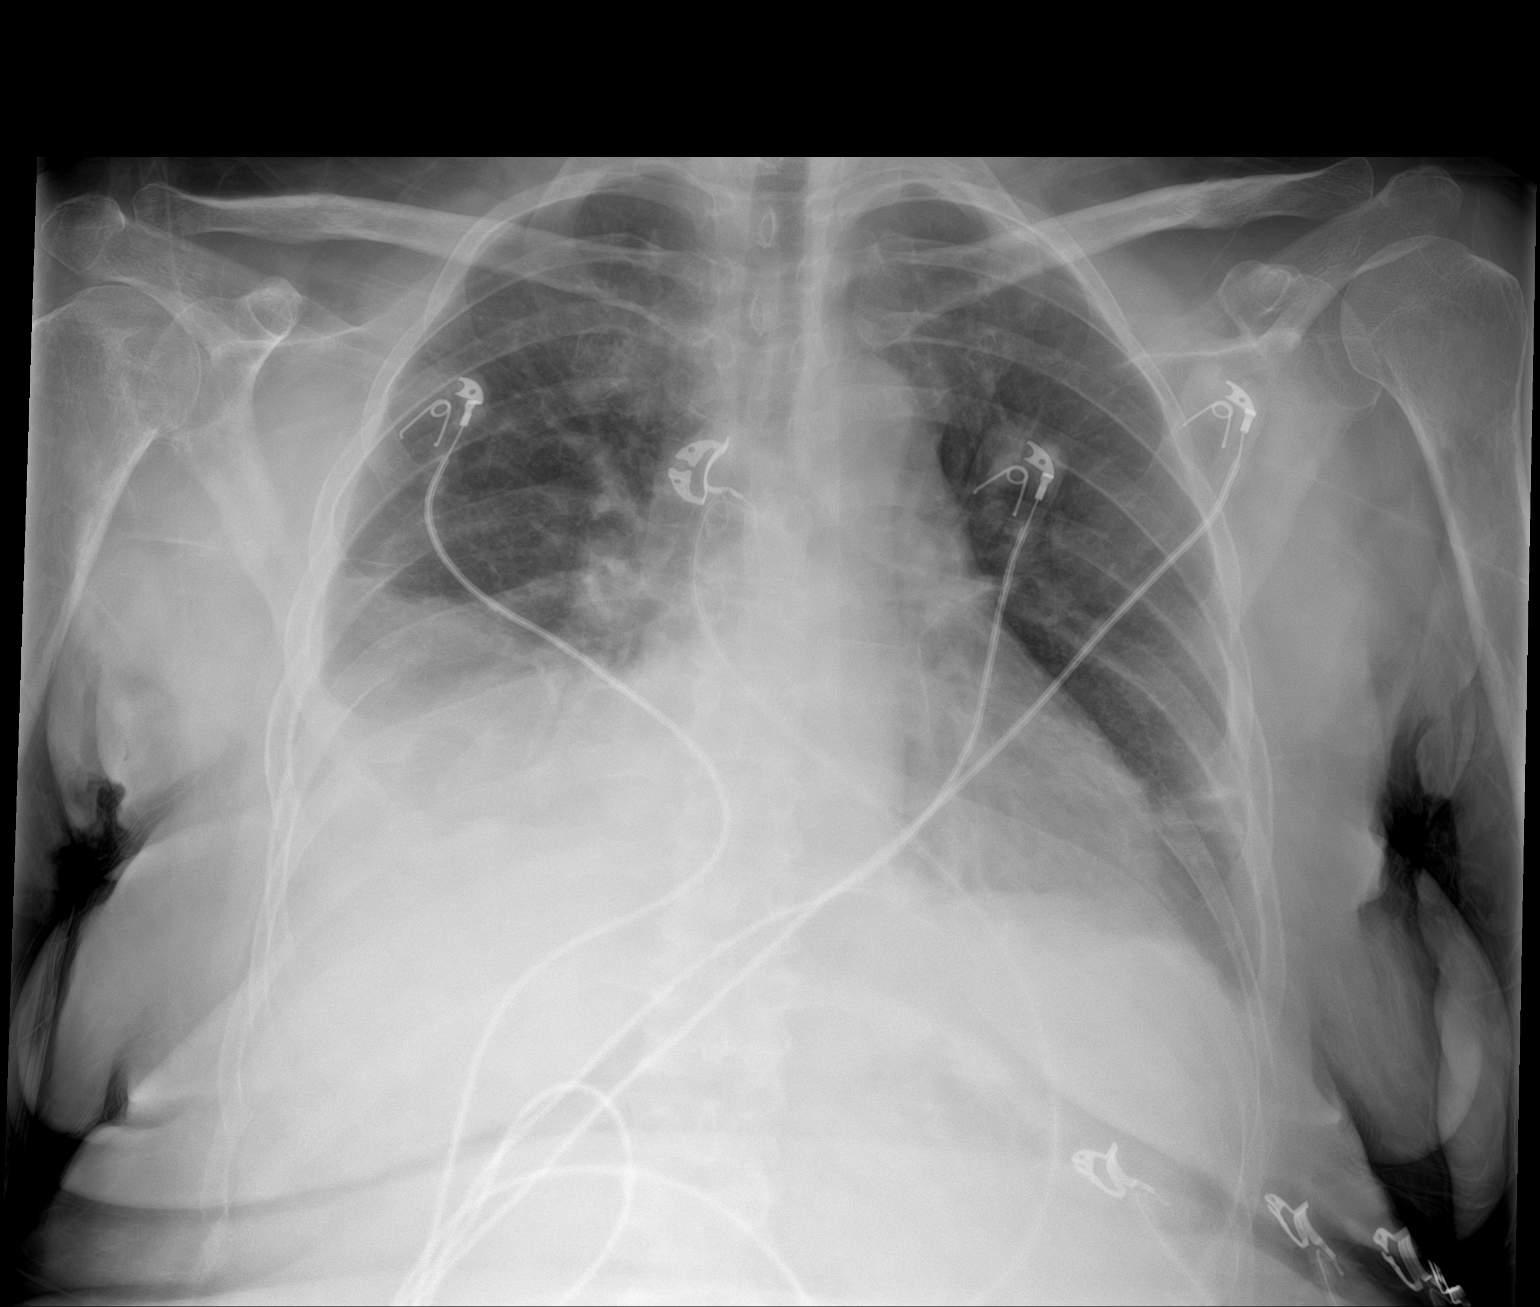

[1 of 1 positions shown; findings below may reference images not displayed]

FINDINGS: There is a moderate right and small left pleural effusion. Bibasilar
airspace disease is noted and favored to represent atelectasis.
There is no pneumothorax. The heart size is stable from prior study.
There are few linear opacities in the left lower lung zone favored
to represent atelectasis or scarring. There is vascular congestion.
IMPRESSION: 1. Persistent bilateral pleural effusions, right greater than left.
2. Bibasilar airspace disease favored to represent atelectasis with
an infiltrate not entirely excluded.

## 2019-08-08 MED ORDER — INSULIN ASPART PROT & ASPART (70-30 MIX) 100 UNIT/ML ~~LOC~~ SUSP
15.0000 [IU] | Freq: Three times a day (TID) | SUBCUTANEOUS | Status: DC
  Administered 2019-08-09 – 2019-08-20 (×15): 15 [IU] via SUBCUTANEOUS
  Filled 2019-08-08 (×4): qty 10

## 2019-08-08 MED ORDER — LABETALOL HCL 5 MG/ML IV SOLN
20.0000 mg | Freq: Once | INTRAVENOUS | Status: AC
  Administered 2019-08-08: 18:00:00 20 mg via INTRAVENOUS
  Filled 2019-08-08: qty 4

## 2019-08-08 MED ORDER — CARVEDILOL 6.25 MG PO TABS
6.2500 mg | ORAL_TABLET | Freq: Two times a day (BID) | ORAL | Status: DC
  Administered 2019-08-08 – 2019-08-21 (×16): 6.25 mg via ORAL
  Filled 2019-08-08 (×25): qty 1

## 2019-08-08 MED ORDER — AMLODIPINE BESYLATE 5 MG PO TABS
5.0000 mg | ORAL_TABLET | Freq: Every day | ORAL | Status: DC
  Administered 2019-08-08 – 2019-08-14 (×5): 5 mg via ORAL
  Filled 2019-08-08 (×9): qty 1

## 2019-08-08 MED ORDER — GABAPENTIN 100 MG PO CAPS
100.0000 mg | ORAL_CAPSULE | Freq: Two times a day (BID) | ORAL | Status: DC
  Administered 2019-08-08 – 2019-08-25 (×32): 100 mg via ORAL
  Filled 2019-08-08 (×34): qty 1

## 2019-08-08 MED ORDER — LABETALOL HCL 5 MG/ML IV SOLN: 10.0000 mg | Freq: Four times a day (QID) | INTRAVENOUS | Status: DC | PRN

## 2019-08-08 MED ORDER — ASPIRIN 325 MG PO TABS
325.0000 mg | ORAL_TABLET | Freq: Every day | ORAL | Status: DC
  Administered 2019-08-08 – 2019-08-10 (×3): 325 mg via ORAL
  Filled 2019-08-08 (×3): qty 1

## 2019-08-08 MED ORDER — INSULIN ASPART 100 UNIT/ML ~~LOC~~ SOLN
0.0000 [IU] | Freq: Three times a day (TID) | SUBCUTANEOUS | Status: DC
  Administered 2019-08-09 (×2): 2 [IU] via SUBCUTANEOUS
  Administered 2019-08-09: 18:00:00 1 [IU] via SUBCUTANEOUS
  Administered 2019-08-10 – 2019-08-15 (×4): 2 [IU] via SUBCUTANEOUS
  Administered 2019-08-15: 17:00:00 3 [IU] via SUBCUTANEOUS
  Administered 2019-08-16: 07:00:00 2 [IU] via SUBCUTANEOUS
  Administered 2019-08-17: 11:00:00 3 [IU] via SUBCUTANEOUS
  Administered 2019-08-17: 1 [IU] via SUBCUTANEOUS
  Administered 2019-08-18: 2 [IU] via SUBCUTANEOUS
  Administered 2019-08-19 – 2019-08-20 (×2): 1 [IU] via SUBCUTANEOUS
  Administered 2019-08-22 – 2019-08-23 (×3): 2 [IU] via SUBCUTANEOUS
  Administered 2019-08-24 (×2): 1 [IU] via SUBCUTANEOUS
  Administered 2019-08-24: 2 [IU] via SUBCUTANEOUS

## 2019-08-08 MED ORDER — PANTOPRAZOLE SODIUM 40 MG IV SOLR
40.0000 mg | INTRAVENOUS | Status: DC
  Administered 2019-08-08: 40 mg via INTRAVENOUS
  Filled 2019-08-08: qty 40

## 2019-08-08 MED ORDER — HEPARIN SODIUM (PORCINE) 5000 UNIT/ML IJ SOLN
5000.0000 [IU] | Freq: Three times a day (TID) | INTRAMUSCULAR | Status: DC
  Administered 2019-08-08 – 2019-08-10 (×5): 5000 [IU] via SUBCUTANEOUS
  Filled 2019-08-08 (×5): qty 1

## 2019-08-08 MED ORDER — SODIUM CHLORIDE 0.9 % IV BOLUS
500.0000 mL | Freq: Once | INTRAVENOUS | Status: AC
  Administered 2019-08-08: 18:00:00 500 mL via INTRAVENOUS

## 2019-08-08 MED ORDER — SENNOSIDES-DOCUSATE SODIUM 8.6-50 MG PO TABS
1.0000 | ORAL_TABLET | Freq: Every evening | ORAL | Status: DC | PRN
  Administered 2019-08-11: 22:00:00 1 via ORAL
  Filled 2019-08-08 (×2): qty 1

## 2019-08-08 MED ORDER — ACETAMINOPHEN 325 MG PO TABS
650.0000 mg | ORAL_TABLET | ORAL | Status: DC | PRN
  Administered 2019-08-09 – 2019-08-18 (×10): 650 mg via ORAL
  Filled 2019-08-08 (×11): qty 2

## 2019-08-08 MED ORDER — LEVOTHYROXINE SODIUM 25 MCG PO TABS
25.0000 ug | ORAL_TABLET | Freq: Every day | ORAL | Status: DC
  Administered 2019-08-09 – 2019-08-25 (×15): 25 ug via ORAL
  Filled 2019-08-08 (×15): qty 1

## 2019-08-08 MED ORDER — SODIUM BICARBONATE 650 MG PO TABS
650.0000 mg | ORAL_TABLET | Freq: Two times a day (BID) | ORAL | Status: DC
  Administered 2019-08-09 – 2019-08-10 (×5): 650 mg via ORAL
  Filled 2019-08-08 (×8): qty 1

## 2019-08-08 MED ORDER — STROKE: EARLY STAGES OF RECOVERY BOOK
Freq: Once | Status: AC
  Filled 2019-08-08 (×2): qty 1

## 2019-08-08 MED ORDER — PRAVASTATIN SODIUM 40 MG PO TABS
40.0000 mg | ORAL_TABLET | Freq: Every day | ORAL | Status: DC
  Administered 2019-08-09 – 2019-08-22 (×13): 40 mg via ORAL
  Filled 2019-08-08 (×15): qty 1

## 2019-08-08 MED ORDER — ACETAMINOPHEN 160 MG/5ML PO SOLN: 650.0000 mg | ORAL | Status: DC | PRN

## 2019-08-08 MED ORDER — ACETAMINOPHEN 650 MG RE SUPP: 650.0000 mg | RECTAL | Status: DC | PRN

## 2019-08-08 MED ORDER — ASPIRIN 300 MG RE SUPP: 300.0000 mg | Freq: Every day | RECTAL | Status: DC

## 2019-08-08 MED ORDER — ONDANSETRON HCL 4 MG/2ML IJ SOLN
4.0000 mg | Freq: Four times a day (QID) | INTRAMUSCULAR | Status: DC | PRN
  Administered 2019-08-10 – 2019-08-24 (×12): 4 mg via INTRAVENOUS
  Filled 2019-08-08 (×11): qty 2

## 2019-08-08 NOTE — H&P (Addendum)
History and Physical    Patient Demographics:    Melanie Hall QMG:[id number] DOB: [birth year] DOA: 08/08/2019  PCP: [facility] [place]  Patient coming from: Home  I have personally briefly reviewed patient's old medical records in [place]  Chief Complaint: Elevated blood pressure, chest pain   Assessment & Plan:     Assessment/Plan Principal Problem:   Acute on chronic renal failure (HCC) Active Problems:   Type 2 diabetes with nephropathy ([place])   HYPERCHOLESTEROLEMIA   GERD (gastroesophageal reflux disease)   MGUS (monoclonal gammopathy of unknown significance)   Accelerated hypertension   Chest pain   Right arm weakness     Principal Problem: Acute on chronic renal failure with baseline CKD stage V Patient presented with a creatinine of 6.4.  Noted to have significantly declining renal function over the last year.  Most recent creatinine was 5.1 on 07/23/2019. Was found to have nephrotic range proteinuria in April 2020. Etiology is not clearly apparent but may be combination of uncontrolled hypertension, NSAID use with concerns of multiple myeloma.  Follows with Dr. Theador Hawthorne from nephrology service as outpatient.  She is planned to have renal biopsy in February 2021. -We will monitor input output, renal function closely -Nephrology consult after transfer to [name] -She will probably need IR consult for dialysis access catheter placement   Other Active Problems: Chest pain, likely atypical Patient has been having right-sided chest pain intermittently over the last 2 weeks worse with deep breathing, reproducible to palpation.  Appears to be atypical and likely musculoskeletal. -Telemetry monitoring -Serial troponins -Pain meds as needed  Right arm weakness Patient presented with subjective feeling of weakness in the right arm, more in the proximal and distal muscles.  No lower extremity weakness.  No sensory abnormalities.  Has had associated  headache but no other neurological deficits.  No visual abnormalities.  Neurological exam at the time of my evaluation is unremarkable.  CT of the head showed no acute abnormalities.  Concern for possible TIA versus hypertensive encephalopathy.  Patient is planned for transfer to [facility] for further evaluation including MRI given nonavailability of MRI as well as neurology on weekends. -Telemetry monitoring -Neurochecks every 4 hours -MRI brain, echocardiogram in a.m. -We will need neurology consult after transfer to [facility].  Hypertension, uncontrolled: Unable to take oral meds over the last week due to nausea and vomiting. Blood pressure is noted to be uncontrolled. -We will continue home dose of amlodipine, carvedilol  Diabetes mellitus type 2: Last A1c was 8.1 05/27/2019. -Continue home dose of NovoLog -Sliding scale insulin coverage with fingerstick monitoring  Hyperlipidemia: -Continue lovastatin  Bilateral pleural effusions Appear to be chronic, possibly due to hypoalbuminemia. No evidence of hypoxemia  Normal anion gap metabolic acidosis Likely secondary to chronic kidney disease -Continue p.o. sodium bicarbonate  Monoclonal gammopathy of undetermined significance: Patient is being followed by Dr. Delton Coombes from oncology service as outpatient. Recently noted to have nephrotic range proteinuria.  Also found to have M spike of 0.2 g.  Light chain ratio is mildly elevated at 2.36.  Plan was to have a skeletal survey and a repeat of myeloma labs including immunofixation. -May need hematology consult during inpatient stay or follow-up as outpatient.  Acute on chronic anemia Hemoglobin is 7.5 on presentation.  Has been noted to have chronic anemia which has been worsening.  She had 1 unit of packed cells red blood transfusion in April 2020 for hemoglobin of 7.8.  Ferritin  and iron studies are within normal limits. -We will plan for transfusion of 1 packed  RBCs -Monitor CBC closely  Hypothyroidism: We will monitor TSH -Continue levothyroxine  Gastroesophageal reflux disease: -Continue pantoprazole  DVT prophylaxis: Heparin subcu Code Status:  Full code Family Communication: N/A  Disposition Plan: Patient is being transferred to [name] for MRI to rule out CVA, nephrology evaluation Consults called: N/A Admission status: Inpatient status    HPI:     HPI: Melanie Hall is a 49 y.o. female with medical history significant of chronic kidney disease stage V, hypertension, hyperlipidemia, diabetes mellitus type 2 who presented to the ER with intermittent chest pain, uncontrolled hypertension. Patient follows with nephrology as outpatient and was supposed to get an Epogen shot but was told she cannot get it as her blood pressure is over 885 systolic. She complains of a gradual onset diffuse headache as well as right arm weakness which is been there for the past 2 weeks.  She also has intermittent chest pain on the right side of her chest that comes and goes lasting for several minutes to hours at a time.  The pain is mostly on the right side is worse with palpation and movement.  There are some associated shortness of breath reported as dyspnea on exertion, no orthopnea reported.  No PND.  No fever, chills, cough, palpitations, diaphoresis, syncope, seizures.  No blurring of vision no visual changes reported. ED Course:  Vital Signs reviewed on presentation, significant for temperature 97.8, heart rate 86, blood pressure 153/83, saturation 99% on room air. Labs reviewed, significant for sodium 133, potassium 4.1, bicarb 16, BUN 73, creatinine 6.4, AST 52, ALT 47, troponin negative, WBC count 11.7, hemoglobin 7.5, hematocrit 22, platelets 193. Imaging personally Reviewed, CT of the head shows no acute intracranial abnormalities.  Chest x-ray shows bilateral pleural effusions, moderate on the right and small on the left.  Bibasilar airspace  disease, possibly atelectasis. EKG personally reviewed, shows sinus tachycardia, no acute ST-T changes.    Review of systems:    Review of Systems: As per HPI otherwise 10 point review of systems negative.  All other review of systems is negative except the ones noted above in the HPI.    Past Medical and Surgical History:  Reviewed by me  Past Medical History:  Diagnosis Date  . Anemia   . Cataracts, bilateral    surgery to remove  . CKD (chronic kidney disease)   . Diabetes mellitus    type 2  . GERD (gastroesophageal reflux disease)   . Hypertension   . SVD (spontaneous vaginal delivery)    x 4    Past Surgical History:  Procedure Laterality Date  . EYE SURGERY Bilateral    cataracts removed  . FEMUR IM NAIL Left 10/28/2018   Procedure: RETROGRADE FEMORAL NAILING;  Surgeon: Meredith Pel, MD;  Location: [facility];  Service: Orthopedics;  Laterality: Left;  . IM NAILING FEMORAL SHAFT RETROGRADE Left 10/28/2018  . KNEE SURGERY    . TUBAL LIGATION       Social History:  Reviewed by me   reports that she quit smoking about 24 years ago. Her smoking use included cigarettes. She has never used smokeless tobacco. She reports that she does not drink alcohol or use drugs.  Allergies:    No Known Allergies  Family History :   Family History  Problem Relation Age of Onset  . Diabetes Mother   . Hypertension Mother   .  Bipolar disorder Mother   . Brain cancer Maternal Aunt   . Heart disease Maternal Grandmother   . Diabetes Maternal Grandmother   . Breast cancer Maternal Aunt   . Diabetes Sister    Family history reviewed, noted as above, not pertinent to current presentation.   Home Medications:    Prior to Admission medications   Medication Sig Start Date End Date Taking? Authorizing Provider  amLODipine (NORVASC) 5 MG tablet Take 1 tablet (5 mg total) by mouth daily. 03/01/19 02/29/20 Yes Barton Dubois, MD  carvedilol (COREG) 3.125 MG tablet Take 3.125 mg  by mouth.  05/13/19 05/12/20 Yes [provider]  gabapentin (NEURONTIN) 100 MG capsule Take 1 capsule (100 mg total) by mouth 2 (two) times daily. 03/01/19  Yes Barton Dubois, MD  hydrochlorothiazide (HYDRODIURIL) 25 MG tablet Take 25 mg by mouth daily. 06/08/19  Yes [provider]  insulin aspart protamine- aspart (NOVOLOG MIX 70/30) (70-30) 100 UNIT/ML injection Inject 15 Units into the skin 3 (three) times daily before meals.   Yes [provider]  lovastatin (MEVACOR) 40 MG tablet Take 40 mg by mouth at bedtime.    Yes [provider]  pantoprazole (PROTONIX) 40 MG tablet Take 1 tablet (40 mg total) by mouth daily. 08/28/18 08/28/19 Yes Barton Dubois, MD  sodium bicarbonate 650 MG tablet Take 650 mg by mouth 2 (two) times daily.  05/15/19  Yes [provider]  acetaminophen (TYLENOL) 325 MG tablet Take 2 tablets (650 mg total) by mouth every 6 (six) hours as needed for mild pain or headache (or Fever >/= 101). Patient not taking: Reported on 07/23/2019 08/28/18   Barton Dubois, MD  epoetin alfa (EPOGEN) 3000 UNIT/ML injection Inject 3,000 Units into the skin every 14 (fourteen) days.  06/12/19   [provider]  levothyroxine (SYNTHROID) 25 MCG tablet Take 25 mcg by mouth daily. 03/10/19   [provider]    Physical Exam:    Physical Exam: Vitals:   08/08/19 1815 08/08/19 1830 08/08/19 1845 08/08/19 1900  BP: 137/63 (!) 151/78 131/73 (!) 153/83  Pulse:   82 85  Resp: (!) '29 20 18 20  ' Temp:      TempSrc:      SpO2:   98% 99%  Weight:      Height:        Constitutional: NAD, calm, comfortable Vitals:   08/08/19 1815 08/08/19 1830 08/08/19 1845 08/08/19 1900  BP: 137/63 (!) 151/78 131/73 (!) 153/83  Pulse:   82 85  Resp: (!) '29 20 18 20  ' Temp:      TempSrc:      SpO2:   98% 99%  Weight:      Height:       Eyes: PERRL, lids and conjunctivae normal ENMT: Mucous membranes are moist. Posterior pharynx clear of any exudate  or lesions.Normal dentition.  Neck: normal, supple, no masses, no thyromegaly Respiratory: Decreased air entry in both bases, right greater than left, scattered crepitations. Normal respiratory effort. No accessory muscle use.  Cardiovascular: Regular rate and rhythm, no murmurs / rubs / gallops. No extremity edema. 2+ pedal pulses. No carotid bruits.  Abdomen: no tenderness, no masses palpated. No hepatosplenomegaly. Bowel sounds positive.  Musculoskeletal: no clubbing / cyanosis. No joint deformity upper and lower extremities. Good ROM, no contractures. Normal muscle tone.  Skin: no rashes, lesions, ulcers. No induration Neurologic: CN 2-12 grossly intact. Sensation intact, DTR normal. Strength 5/5 in all 4.  Psychiatric: Normal judgment  and insight. Alert and oriented x 3. Normal mood.    Decubitus Ulcers: Not present on admission Catheters and tubes: None  Data Review:    Labs on Admission: I have personally reviewed following labs and imaging studies  CBC: Recent Labs  Lab 08/06/19 1146 08/08/19 1607  WBC  --  11.7*  NEUTROABS  --  8.8*  HGB 9.5* 7.5*  HCT  --  22.9*  MCV  --  94.6  PLT  --  559   Basic Metabolic Panel: Recent Labs  Lab 08/08/19 1607  NA 133*  K 4.2  CL 106  CO2 16*  GLUCOSE 263*  BUN 73*  CREATININE 6.40*  CALCIUM 8.1*   GFR: Estimated Creatinine Clearance: 9.7 mL/min (A) (by C-G formula based on SCr of 6.4 mg/dL (H)). Liver Function Tests: Recent Labs  Lab 08/08/19 1607  AST 52*  ALT 47*  ALKPHOS 82  BILITOT 1.0  PROT 6.4*  ALBUMIN 2.5*   Recent Labs  Lab 08/08/19 1607  LIPASE 17   No results for input(s): AMMONIA in the last 168 hours. Coagulation Profile: No results for input(s): INR, PROTIME in the last 168 hours. Cardiac Enzymes: No results for input(s): CKTOTAL, CKMB, CKMBINDEX, TROPONINI in the last 168 hours. BNP (last 3 results) No results for input(s): PROBNP in the last 8760 hours. HbA1C: No results for input(s):  HGBA1C in the last 72 hours. CBG: No results for input(s): GLUCAP in the last 168 hours. Lipid Profile: No results for input(s): CHOL, HDL, LDLCALC, TRIG, CHOLHDL, LDLDIRECT in the last 72 hours. Thyroid Function Tests: No results for input(s): TSH, T4TOTAL, FREET4, T3FREE, THYROIDAB in the last 72 hours. Anemia Panel: No results for input(s): VITAMINB12, FOLATE, FERRITIN, TIBC, IRON, RETICCTPCT in the last 72 hours. Urine analysis:    Component Value Date/Time   COLORURINE YELLOW 05/27/2019 1314   APPEARANCEUR CLOUDY (A) 05/27/2019 1314   LABSPEC 1.015 05/27/2019 1314   PHURINE 5.0 05/27/2019 1314   GLUCOSEU 150 (A) 05/27/2019 1314   HGBUR SMALL (A) 05/27/2019 1314   HGBUR negative 09/14/2009 1428   BILIRUBINUR NEGATIVE 05/27/2019 1314   KETONESUR NEGATIVE 05/27/2019 1314   PROTEINUR >=300 (A) 05/27/2019 1314   UROBILINOGEN 1.0 10/27/2013 2022   NITRITE NEGATIVE 05/27/2019 1314   LEUKOCYTESUR MODERATE (A) 05/27/2019 1314     Imaging Results:      Radiological Exams on Admission: CT Head Wo Contrast  Result Date: 08/08/2019 CLINICAL DATA:  Headache 2 days.  Hypertension.  Possible stroke. EXAM: CT HEAD WITHOUT CONTRAST TECHNIQUE: Contiguous axial images were obtained from the base of the skull through the vertex without intravenous contrast. COMPARISON:  None. FINDINGS: Brain: Ventricles, cisterns and other CSF spaces are normal. There is no mass, mass effect, shift of midline structures or acute hemorrhage. No evidence of acute infarction. Vascular: No hyperdense vessel or unexpected calcification. Skull: Normal. Negative for fracture or focal lesion. Sinuses/Orbits: No acute finding. Other: None. IMPRESSION: No acute findings. Electronically Signed   By: Marin Olp M.D.   On: 08/08/2019 17:31   DG Chest Portable 1 View  Result Date: 08/08/2019 CLINICAL DATA:  Weakness EXAM: PORTABLE CHEST 1 VIEW COMPARISON:  02/28/2019 FINDINGS: There is a moderate right and small left  pleural effusion. Bibasilar airspace disease is noted and favored to represent atelectasis. There is no pneumothorax. The heart size is stable from prior study. There are few linear opacities in the left lower lung zone favored to represent atelectasis or scarring. There is vascular congestion.  IMPRESSION: 1. Persistent bilateral pleural effusions, right greater than left. 2. Bibasilar airspace disease favored to represent atelectasis with an infiltrate not entirely excluded. Electronically Signed   By: Constance Holster M.D.   On: 08/08/2019 16:42      Josuha Fontanez Ginette Otto MD [facility]  If 7PM-7AM, please contact night-coverage   08/08/2019, 7:08 PM

## 2019-08-08 NOTE — ED Notes (Signed)
Pt had large BM in bed. Pt cleaned and linens changed.

## 2019-08-08 NOTE — ED Triage Notes (Signed)
Pt reports being out of her bp meds for several weeks. Got them back on Thursday and resumed taking them. Is concerned bp is still too high.

## 2019-08-08 NOTE — ED Provider Notes (Signed)
[facility] Provider Note   CSN: [id number] Arrival date & time: 08/08/19  1516     History Chief Complaint  Patient presents with  . Hypertension    Melanie Hall is a 49 y.o. female.  Patient with history of CKD, hypertension, diabetes presenting with multiple complaints.  States she went to receive an Epogen shot at the kidney doctor on January 28 and was told that she could not get it because her blood pressure was high.  She was referred to the ED at that time but did not go.  States her blood pressure was over 750 systolic.  She has been out of her blood pressure medication which include amlodipine and Coreg for a week but has them now.  States she took these medications on the 28th but immediately had vomiting and has not been able to keep anything down since.  States she vomited 5 times yesterday and none today. Has not had any medication since January 28. She complains of a gradual onset diffuse headache as well as right arm weakness which is been there for the past 2 weeks.  She also has intermittent chest pain on the right side of her chest that comes and goes lasting for several minutes to hours at a time.  The pain is mostly on the right side is worse with palpation and movement.  There are some associated shortness of breath.  No cough or fever.  No abdominal pain or diarrhea.  No pain with urination or blood in the stool. She does not notice any difficulty speaking or difficulty swallowing. She denies any history of CAD.  Does not take any blood thinners.  She has not been on dialysis  The history is provided by the patient.  Hypertension Associated symptoms include chest pain and headaches. Pertinent negatives include no abdominal pain and no shortness of breath.       Past Medical History:  Diagnosis Date  . Anemia   . Cataracts, bilateral    surgery to remove  . CKD (chronic kidney disease)   . Diabetes mellitus    type 2  . GERD  (gastroesophageal reflux disease)   . Hypertension   . SVD (spontaneous vaginal delivery)    x 4    Patient Active Problem List   Diagnosis Date Noted  . MGUS (monoclonal gammopathy of unknown significance) 07/23/2019  . Diarrhea   . AKI (acute kidney injury) ([place]) 02/28/2019  . Acute on chronic renal failure ([place]) 02/28/2019  . GERD (gastroesophageal reflux disease) 02/28/2019  . Closed fracture of left femur ([place]) 10/28/2018  . Hypertension associated with diabetes ([place]) 10/28/2018  . Sprain of left ankle   . Emphysematous cystitis 08/26/2018  . Vaginosis 08/26/2018  . Hypokalemia 08/26/2018  . Normocytic anemia 08/26/2018  . Closed fracture of right ankle 11/06/2017  . IRREGULAR MENSES 09/14/2009  . BURN, FIRST DEGREE, ARM 09/14/2009  . HYPERCHOLESTEROLEMIA 11/19/2008  . PARONYCHIA, RIGHT GREAT TOE 07/30/2008  . ABSCESS, TOOTH 03/31/2008  . Type 2 diabetes with nephropathy ([place]) 07/02/2007  . CATARACTS, BILATERAL 07/02/2007    Past Surgical History:  Procedure Laterality Date  . EYE SURGERY Bilateral    cataracts removed  . FEMUR IM NAIL Left 10/28/2018   Procedure: RETROGRADE FEMORAL NAILING;  Surgeon: Meredith Pel, MD;  Location: [facility];  Service: Orthopedics;  Laterality: Left;  . IM NAILING FEMORAL SHAFT RETROGRADE Left 10/28/2018  . KNEE SURGERY    . TUBAL LIGATION  OB History    Gravida  4   Para  4   Term  4   Preterm      AB      Living        SAB      TAB      Ectopic      Multiple      Live Births              Family History  Problem Relation Age of Onset  . Diabetes Mother   . Hypertension Mother   . Bipolar disorder Mother   . Brain cancer Maternal Aunt   . Heart disease Maternal Grandmother   . Diabetes Maternal Grandmother   . Breast cancer Maternal Aunt   . Diabetes Sister     Social History   Tobacco Use  . Smoking status: Former Smoker    Types: Cigarettes    Quit date: 1997    Years since quitting:  24.0  . Smokeless tobacco: Never Used  Substance Use Topics  . Alcohol use: No  . Drug use: No    Home Medications Prior to Admission medications   Medication Sig Start Date End Date Taking? Authorizing Provider  acetaminophen (TYLENOL) 325 MG tablet Take 2 tablets (650 mg total) by mouth every 6 (six) hours as needed for mild pain or headache (or Fever >/= 101). Patient not taking: Reported on 07/23/2019 08/28/18   Barton Dubois, MD  amLODipine (NORVASC) 5 MG tablet Take 1 tablet (5 mg total) by mouth daily. 03/01/19 02/29/20  Barton Dubois, MD  amLODipine (NORVASC) 5 MG tablet Take by mouth.    [provider]  aspirin EC 81 MG EC tablet Take 1 tablet (81 mg total) by mouth 2 (two) times daily. Patient not taking: Reported on 02/28/2019 11/03/18   Meredith Pel, MD  blood glucose meter kit and supplies KIT Dispense based on patient and insurance preference. Use to check blood sugar three times daily as directed. (FOR ICD-9 250.00, 250.01). Patient not taking: Reported on 02/28/2019 08/28/18   Barton Dubois, MD  carvedilol (COREG) 3.125 MG tablet Take by mouth. 05/13/19 05/12/20  [provider]  epoetin alfa (EPOGEN) 3000 UNIT/ML injection Inject into the skin. 06/12/19   [provider]  gabapentin (NEURONTIN) 100 MG capsule Take 1 capsule (100 mg total) by mouth 2 (two) times daily. 03/01/19   Barton Dubois, MD  insulin aspart protamine- aspart (NOVOLOG MIX 70/30) (70-30) 100 UNIT/ML injection Inject 15 Units into the skin 3 (three) times daily before meals.    [provider]  levothyroxine (SYNTHROID) 25 MCG tablet Take 25 mcg by mouth daily. 03/10/19   [provider]  lovastatin (MEVACOR) 20 MG tablet Take 40 mg by mouth at bedtime.    [provider]  pantoprazole (PROTONIX) 40 MG tablet Take 1 tablet (40 mg total) by mouth daily. 08/28/18 08/28/19  Barton Dubois, MD  sodium bicarbonate 650 MG tablet Take 650 mg by mouth 3 (three)  times daily. 05/15/19   [provider]    Allergies    Patient has no known allergies.  Review of Systems   Review of Systems  Constitutional: Positive for activity change and fatigue. Negative for appetite change and fever.  HENT: Negative for congestion and rhinorrhea.   Eyes: Negative for visual disturbance.  Respiratory: Positive for chest tightness. Negative for cough and shortness of breath.   Cardiovascular: Positive for chest pain. Negative for leg swelling.  Gastrointestinal:  Positive for nausea and vomiting. Negative for abdominal pain.  Genitourinary: Negative for dysuria and hematuria.  Musculoskeletal: Positive for arthralgias and myalgias.  Skin: Negative for wound.  Neurological: Positive for weakness and headaches. Negative for dizziness and numbness.    all other systems are negative except as noted in the HPI and PMH.   Physical Exam Updated Vital Signs BP (!) 157/86 (BP Location: Right Arm)   Pulse 100   Temp 97.8 F (36.6 C) (Oral)   Resp 16   Ht _0  (1.6 m)   Wt 65.8 kg   LMP 12/17/2016   SpO2 99%   BMI 25.69 kg/m   Physical Exam Vitals and nursing note reviewed.  Constitutional:      General: She is not in acute distress.    Appearance: She is well-developed.     Comments: Pale appearing,\ chronically ill-appearing  HENT:     Head: Normocephalic and atraumatic.     Mouth/Throat:     Pharynx: No oropharyngeal exudate.  Eyes:     Conjunctiva/sclera: Conjunctivae normal.     Pupils: Pupils are equal, round, and reactive to light.  Neck:     Comments: No meningismus. Cardiovascular:     Rate and Rhythm: Normal rate and regular rhythm.     Heart sounds: Normal heart sounds. No murmur.  Pulmonary:     Effort: Pulmonary effort is normal. No respiratory distress.     Breath sounds: Normal breath sounds.  Chest:     Chest wall: Tenderness present.  Abdominal:     Palpations: Abdomen is soft.     Tenderness: There is no abdominal  tenderness. There is no guarding or rebound.  Musculoskeletal:        General: No tenderness. Normal range of motion.     Cervical back: Normal range of motion and neck supple.  Skin:    General: Skin is warm.  Neurological:     Mental Status: She is alert and oriented to person, place, and time.     Cranial Nerves: No cranial nerve deficit.     Motor: No abnormal muscle tone.     Coordination: Coordination normal.     Comments: Cranial nerves II to XII intact, no facial asymmetry.  Tongue is midline.  There is some weakness in the right arm compared to the left with pronator drift. 4/5 strength of right arm compared to 5/5 strength on the left. Equal strength in lower extremities.  Psychiatric:        Behavior: Behavior normal.     ED Results / Procedures / Treatments   Labs (all labs ordered are listed, but only abnormal results are displayed) Labs Reviewed  CBC WITH DIFFERENTIAL/PLATELET - Abnormal; Notable for the following components:      Result Value   WBC 11.7 (*)    RBC 2.42 (*)    Hemoglobin 7.5 (*)    HCT 22.9 (*)    Neutro Abs 8.8 (*)    Monocytes Absolute 1.3 (*)    Abs Immature Granulocytes 0.13 (*)    All other components within normal limits  COMPREHENSIVE METABOLIC PANEL - Abnormal; Notable for the following components:   Sodium 133 (*)    CO2 16 (*)    Glucose, Bld 263 (*)    BUN 73 (*)    Creatinine, Ser 6.40 (*)    Calcium 8.1 (*)    Total Protein 6.4 (*)    Albumin 2.5 (*)    AST 52 (*)  ALT 47 (*)    GFR calc non Af Amer 7 (*)    GFR calc Af Amer 8 (*)    All other components within normal limits  RESPIRATORY PANEL BY RT PCR (FLU A&B, COVID)  LIPASE, BLOOD  URINALYSIS, ROUTINE W REFLEX MICROSCOPIC  TROPONIN I (HIGH SENSITIVITY)  TROPONIN I (HIGH SENSITIVITY)    EKG EKG Interpretation  Date/Time:  Saturday August 08 2019 15:57:16 EST Ventricular Rate:  100 PR Interval:    QRS Duration: 74 QT Interval:  332 QTC Calculation: 429 R  Axis:   46 Text Interpretation: Sinus tachycardia Anteroseptal infarct, old Rate faster Nonspecific T wave abnormality Confirmed by Ezequiel Essex [phone]) on 08/08/2019 4:01:19 PM   Radiology CT Head Wo Contrast  Result Date: 08/08/2019 CLINICAL DATA:  Headache 2 days.  Hypertension.  Possible stroke. EXAM: CT HEAD WITHOUT CONTRAST TECHNIQUE: Contiguous axial images were obtained from the base of the skull through the vertex without intravenous contrast. COMPARISON:  None. FINDINGS: Brain: Ventricles, cisterns and other CSF spaces are normal. There is no mass, mass effect, shift of midline structures or acute hemorrhage. No evidence of acute infarction. Vascular: No hyperdense vessel or unexpected calcification. Skull: Normal. Negative for fracture or focal lesion. Sinuses/Orbits: No acute finding. Other: None. IMPRESSION: No acute findings. Electronically Signed   By: Marin Olp M.D.   On: 08/08/2019 17:31   DG Chest Portable 1 View  Result Date: 08/08/2019 CLINICAL DATA:  Weakness EXAM: PORTABLE CHEST 1 VIEW COMPARISON:  02/28/2019 FINDINGS: There is a moderate right and small left pleural effusion. Bibasilar airspace disease is noted and favored to represent atelectasis. There is no pneumothorax. The heart size is stable from prior study. There are few linear opacities in the left lower lung zone favored to represent atelectasis or scarring. There is vascular congestion. IMPRESSION: 1. Persistent bilateral pleural effusions, right greater than left. 2. Bibasilar airspace disease favored to represent atelectasis with an infiltrate not entirely excluded. Electronically Signed   By: Constance Holster M.D.   On: 08/08/2019 16:42    Procedures Procedures (including critical care time)  Medications Ordered in ED Medications - No data to display  ED Course  I have reviewed the triage vital signs and the nursing notes.  Pertinent labs & imaging results that were available during my care of the  patient were reviewed by me and considered in my medical decision making (see chart for details).    MDM Rules/Calculators/A&P                      Patient with multiple complaints including nausea and vomiting, chest pain, right arm weakness, elevated blood pressure and headache.  She reports weakness in her right arm for the past 2 weeks.  She has intermittent chest pain that lasts for several minutes to hours at a time on the right side that is constant.  EKG shows sinus rhythm without acute ST changes. Chest x-ray shows persistent effusions bilaterally.  Creatinine is worse to 6.4.  It appears her baseline is in the 2-3 range.  She is reportedly going to have a kidney biopsy later this week.  Blood pressure has worsened.  Patient states she is not had her home meds in several days due to nausea and vomiting.  Upper extremity blood pressures are equal.  Her chest pain is reproducible.  Her pain has been constant for several days.  Troponin is negative.  Blood pressure improving with IV labetalol.  Is equal  in her upper extremities.  Low suspicion for ACS, PE, aortic dissection.  Patient has ongoing right arm weakness that has been there for the past 2 weeks.  She will need MRI to rule out CVA as well as further evaluation for her worsening kidney function.  Observation admission discussed with Dr. Scherrie November.  Final Clinical Impression(s) / ED Diagnoses Final diagnoses:  Acute renal failure superimposed on chronic kidney disease, unspecified CKD stage, unspecified acute renal failure type ([place])  Right arm weakness  Atypical chest pain    Rx / DC Orders ED Discharge Orders    None       Yassen Kinnett, Annie Main, MD 08/08/19 [phone]

## 2019-08-09 ENCOUNTER — Inpatient Hospital Stay (HOSPITAL_COMMUNITY): Payer: Medicaid Other

## 2019-08-09 ENCOUNTER — Encounter (HOSPITAL_COMMUNITY): Payer: Self-pay | Admitting: Internal Medicine

## 2019-08-09 DIAGNOSIS — G459 Transient cerebral ischemic attack, unspecified: Secondary | ICD-10-CM

## 2019-08-09 DIAGNOSIS — N185 Chronic kidney disease, stage 5: Secondary | ICD-10-CM

## 2019-08-09 DIAGNOSIS — N179 Acute kidney failure, unspecified: Secondary | ICD-10-CM

## 2019-08-09 LAB — BASIC METABOLIC PANEL
Anion gap: 14 (ref 5–15)
BUN: 73 mg/dL — ABNORMAL HIGH (ref 6–20)
CO2: 15 mmol/L — ABNORMAL LOW (ref 22–32)
Calcium: 7.7 mg/dL — ABNORMAL LOW (ref 8.9–10.3)
Chloride: 107 mmol/L (ref 98–111)
Creatinine, Ser: 6.76 mg/dL — ABNORMAL HIGH (ref 0.44–1.00)
GFR calc Af Amer: 8 mL/min — ABNORMAL LOW (ref >60)
GFR calc non Af Amer: 7 mL/min — ABNORMAL LOW (ref >60)
Glucose, Bld: 170 mg/dL — ABNORMAL HIGH (ref 70–99)
Potassium: 4 mmol/L (ref 3.5–5.1)
Sodium: 136 mmol/L (ref 135–145)

## 2019-08-09 LAB — ECHOCARDIOGRAM COMPLETE
Height: 63 in
Weight: 2320 oz

## 2019-08-09 LAB — CBC
HCT: 18.4 % — ABNORMAL LOW (ref 36.0–46.0)
Hemoglobin: 6.2 g/dL — CL (ref 12.0–15.0)
MCH: 31 pg (ref 26.0–34.0)
MCHC: 33.7 g/dL (ref 30.0–36.0)
MCV: 92 fL (ref 80.0–100.0)
Platelets: 138 10*3/uL — ABNORMAL LOW (ref 150–400)
RBC: 2 MIL/uL — ABNORMAL LOW (ref 3.87–5.11)
RDW: 13.8 % (ref 11.5–15.5)
WBC: 8 10*3/uL (ref 4.0–10.5)
nRBC: 0 % (ref 0.0–0.2)

## 2019-08-09 LAB — HEMOGLOBIN AND HEMATOCRIT, BLOOD
HCT: 23.3 % — ABNORMAL LOW (ref 36.0–46.0)
Hemoglobin: 8 g/dL — ABNORMAL LOW (ref 12.0–15.0)

## 2019-08-09 LAB — VITAMIN B12: Vitamin B-12: 830 pg/mL (ref 180–914)

## 2019-08-09 LAB — TRANSFERRIN: Transferrin: 145 mg/dL — ABNORMAL LOW (ref 192–382)

## 2019-08-09 LAB — IRON AND TIBC
Iron: 22 ug/dL — ABNORMAL LOW (ref 28–170)
Saturation Ratios: 11 % (ref 10.4–31.8)
TIBC: 203 ug/dL — ABNORMAL LOW (ref 250–450)
UIBC: 181 ug/dL

## 2019-08-09 LAB — FERRITIN: Ferritin: 303 ng/mL (ref 11–307)

## 2019-08-09 LAB — MAGNESIUM: Magnesium: 1.7 mg/dL (ref 1.7–2.4)

## 2019-08-09 LAB — PREPARE RBC (CROSSMATCH)

## 2019-08-09 IMAGING — CT CT L SPINE W/O CM
3 series · 12 of 33 positions shown, 14 images · non-contrast
Comparison: Radiographic skeletal survey [DATE]. CT Abdomen and
Pelvis [DATE].

CLINICAL DATA: 48-year-old female with increasing low back pain for
several days. Monoclonal gammopathy of unknown significance.
Osteoporosis.

EXAM:
CT LUMBAR SPINE WITHOUT CONTRAST
TECHNIQUE: Multidetector CT imaging of the lumbar spine was performed without
intravenous contrast administration. Multiplanar CT image
reconstructions were also generated.

[Series 4: l-spine 2.0 st · axial · 0.38mm/px · z∈[+1348,+1546]mm · 4 of 145 slices shown, 5 images]
[im 23/145  soft-tissue]
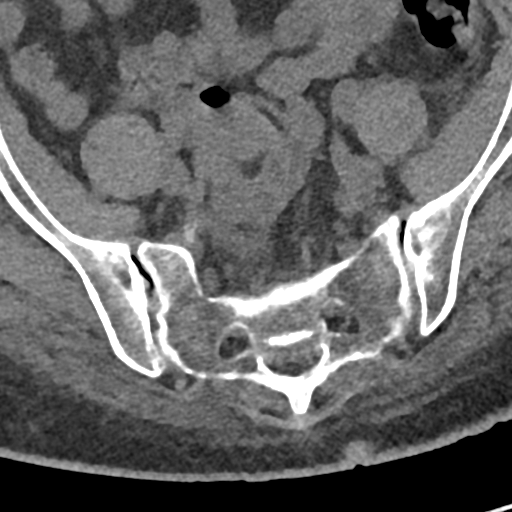
[im 23/145  bone]
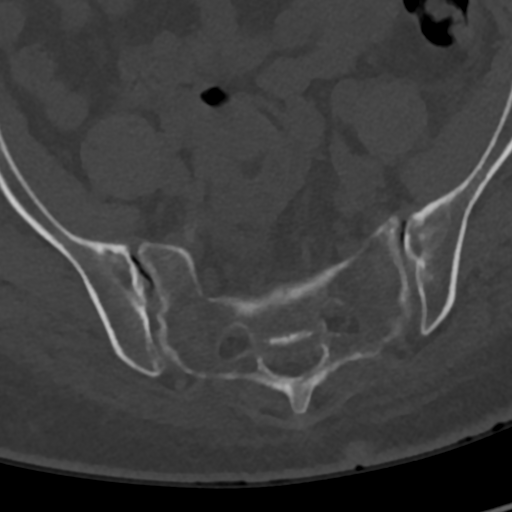
[im 56/145  bone]
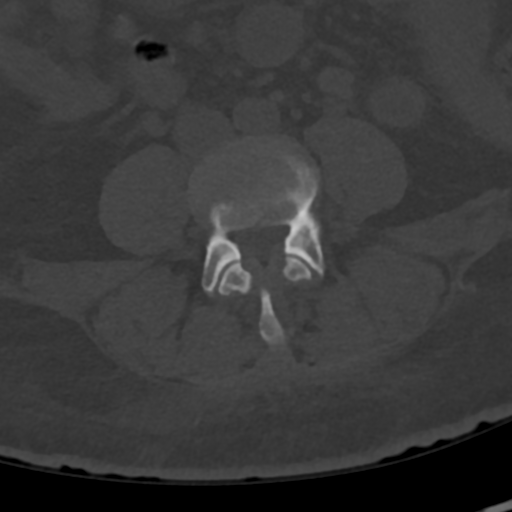
[im 89/145  bone]
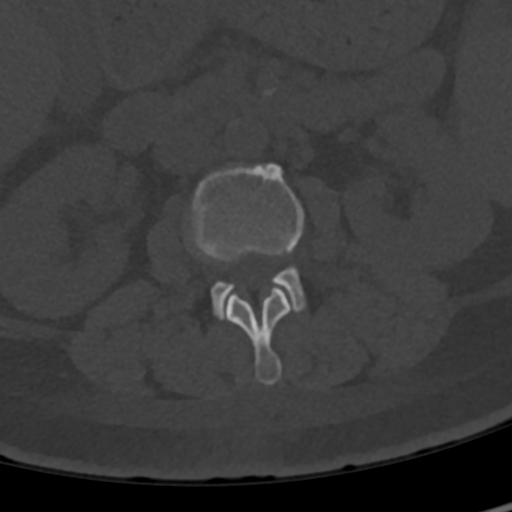
[im 122/145  bone]
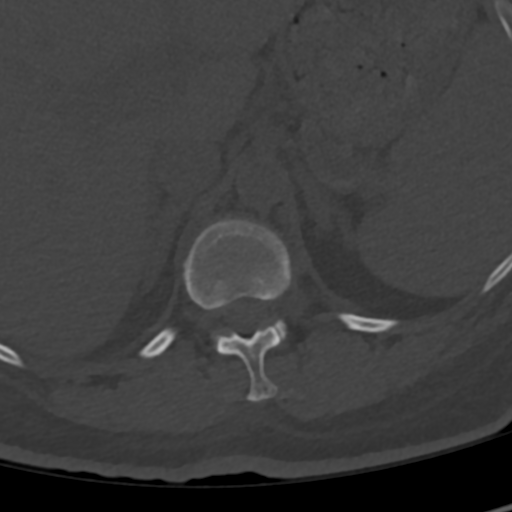

[Series 10: l-spine 2.0 cor · coronal · 0.49mm/px · 3 of 62 slices shown]
[im 13/62  bone]
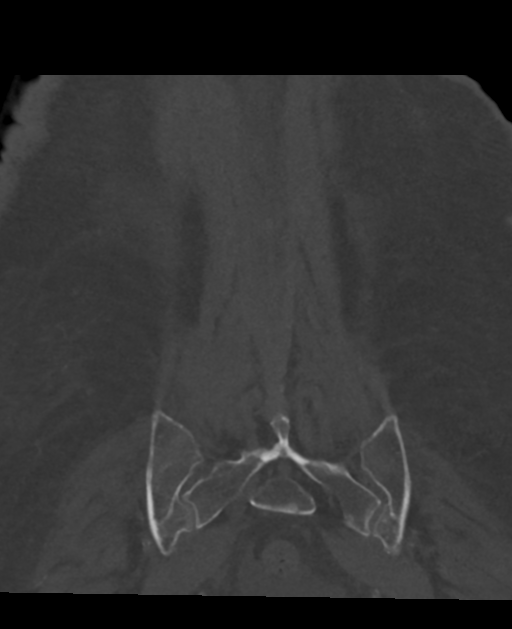
[im 25/62  bone]
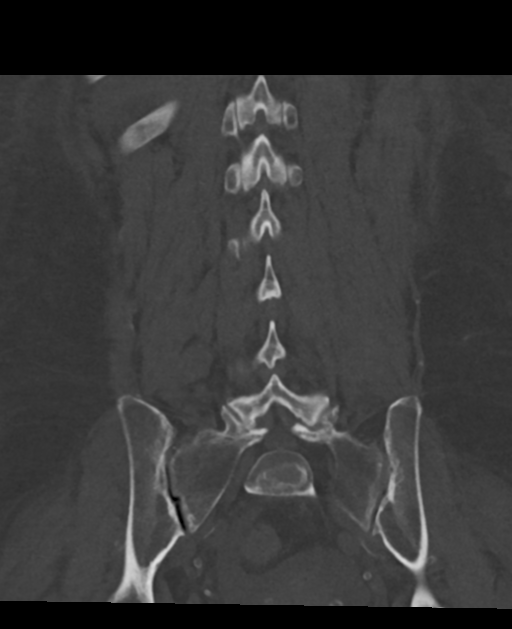
[im 37/62  bone]
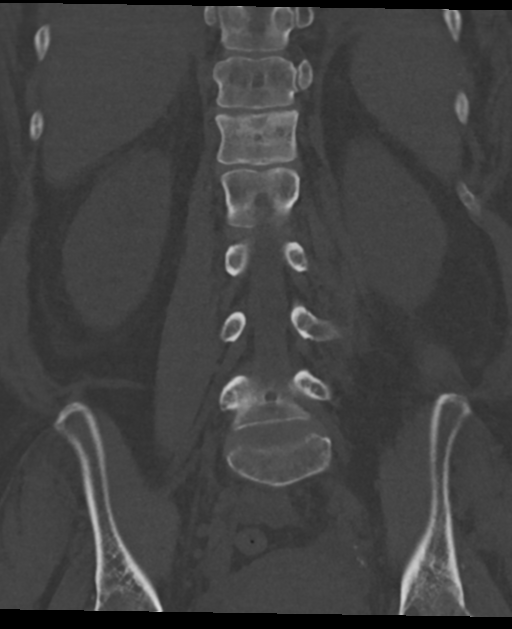

[Series 11: l-spine 2.0 sag · sagittal · 0.36mm/px · 5 of 71 slices shown, 6 images]
[im 24/71  bone]
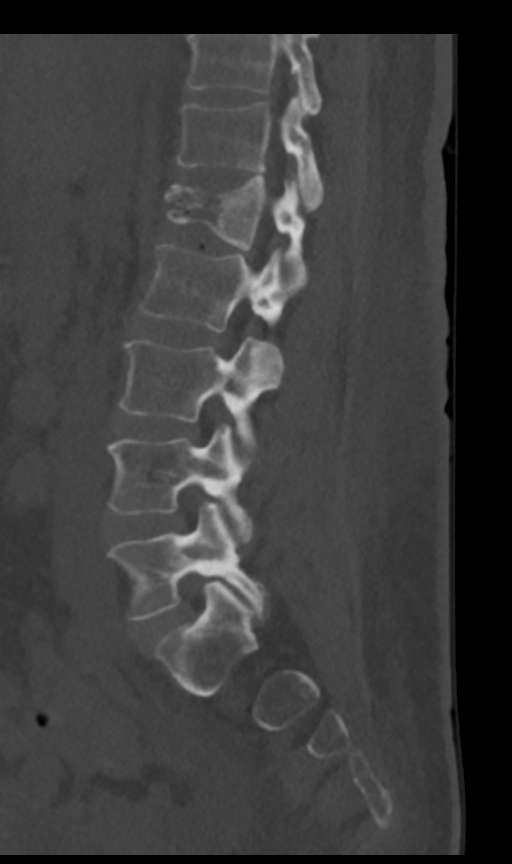
[im 30/71  bone]
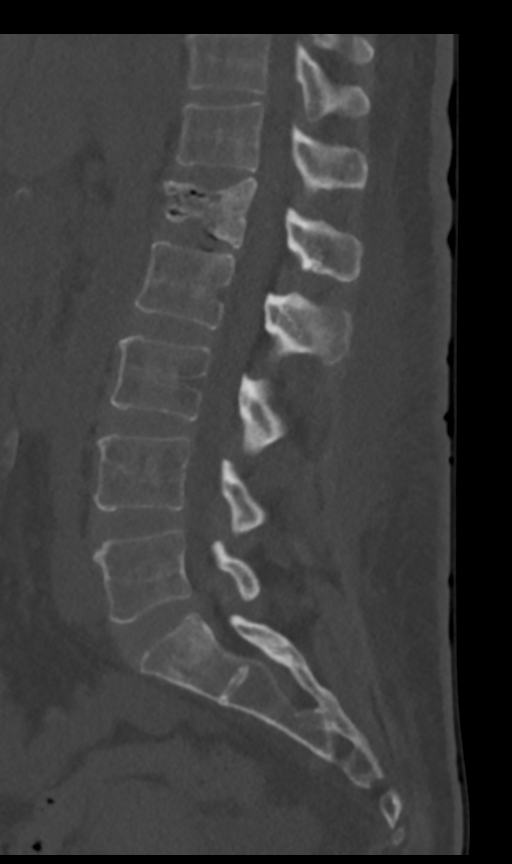
[im 36/71  soft-tissue]
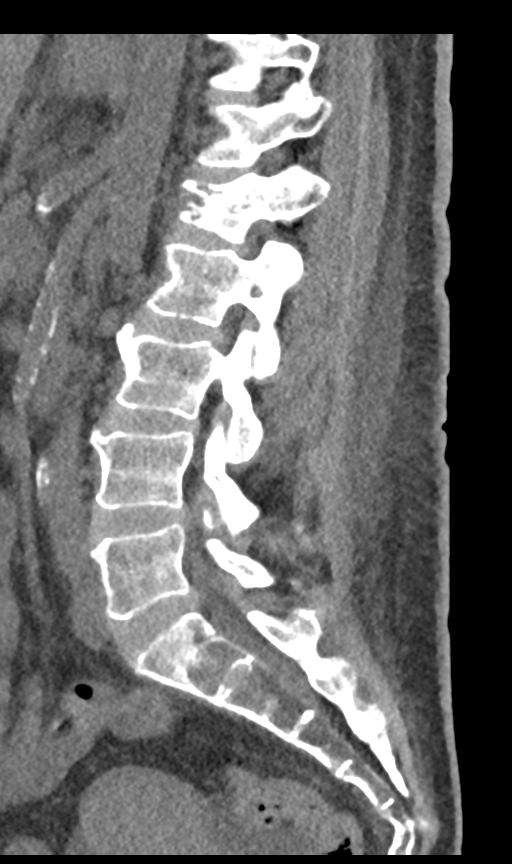
[im 36/71  bone]
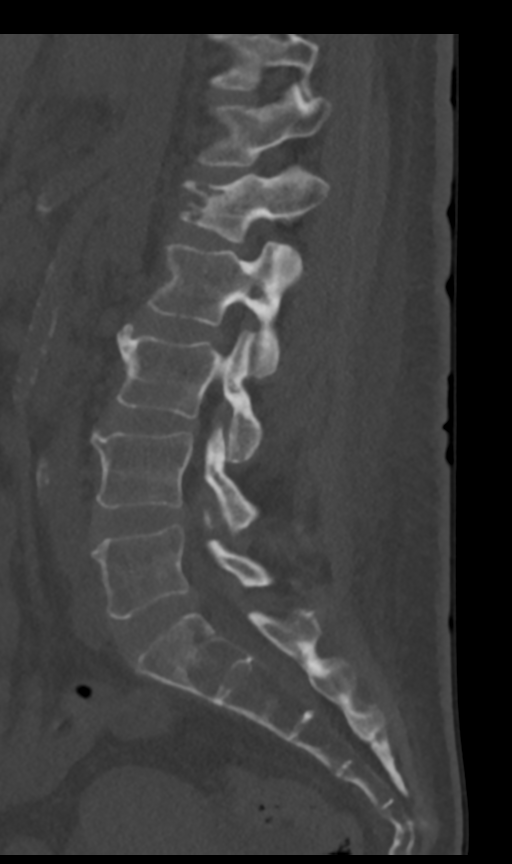
[im 41/71  bone]
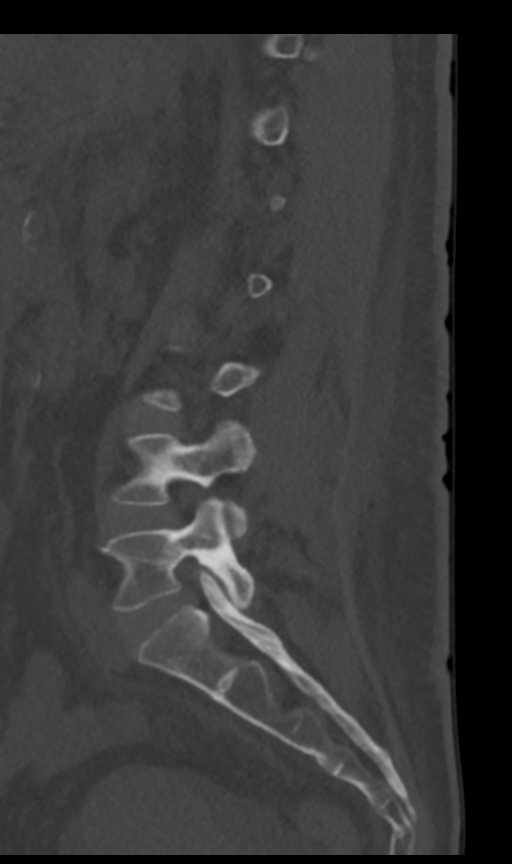
[im 47/71  bone]
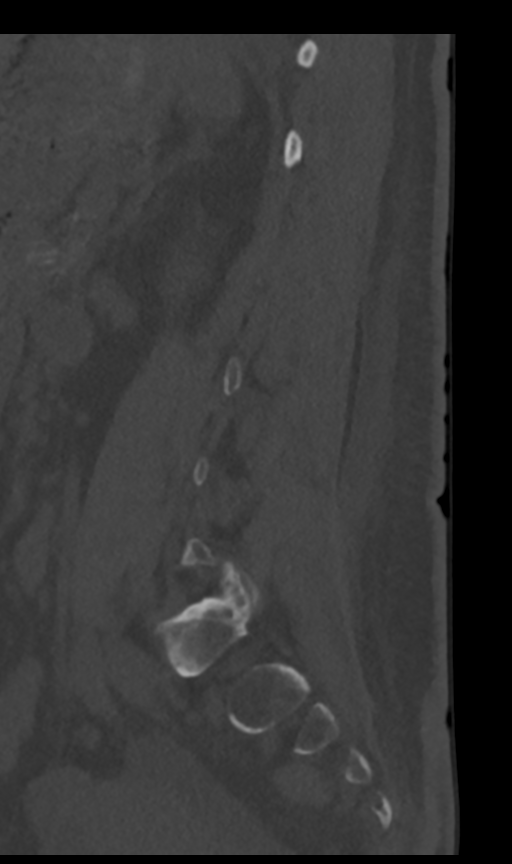

[12 of 33 positions shown; findings below may reference images not displayed]

FINDINGS: Segmentation: Normal.

Alignment: Stable from the skeletal survey earlier this month.
Relatively maintained lumbar lordosis. No spondylolisthesis.

Vertebrae: Background diffuse osteopenia. Visible lower thoracic
levels appear intact.

Moderate to severe L1 compression fracture with anterior wedging
stable from the radiographs earlier this month. Prominent gas within
the fractured vertebral body and the adjacent L1-L2 disc. No
significant retropulsion of bone. L1 posterior elements appear
intact.

Remaining lumbar levels appear intact. Visible sacrum and SI joints
appear intact.

Paraspinal and other soft tissues: Chronic cholelithiasis with a
hazy appearance of gallbladder appears similar to the [63] CT
Abdomen and Pelvis. Pleural fluid at the costophrenic angles is also
redemonstrated.

There is a small to moderate volume of free fluid in the visible
pelvis, cul-de-sac with simple fluid density (series 4, image 144).

Superimposed extensive abdominal and pelvic visceral artery
calcified atherosclerosis.

Disc levels: Mild for age lumbar spine degeneration.
IMPRESSION: 1. L1 compression fracture is stable from the skeletal survey
earlier this month. Gas within the fractured vertebral body
indicates osteonecrosis (Kmmell disease). And there is posttraumatic
or degenerative gas in the adjacent L1-L2 disc. No significant
retropulsion of bone or complicating features.

2. Osteopenia. No other acute osseous abnormality in the lumbar
spine.

3. Nonspecific small volume of ascites in the visible pelvis.
Pleural fluid visible in the costophrenic angles.

4. Chronic cholelithiasis.

5. Extensive calcified atherosclerosis.

## 2019-08-09 IMAGING — MR MR HEAD W/O CM
10 of 11 series · 42 of 48 positions shown · non-contrast
Comparison: Prior CT from [DATE].

CLINICAL DATA: Initial evaluation for acute encephalopathy.  TIA.

EXAM:
MRI HEAD WITHOUT CONTRAST
TECHNIQUE: Multiplanar, multiecho pulse sequences of the brain and surrounding
structures were obtained without intravenous contrast.

[Series 5: DWI · axial · 3.0mm · 0.88mm/px · z∈[-76,+54]mm · 9 of 90 slices shown (1 of 4)]
[im 1/90]
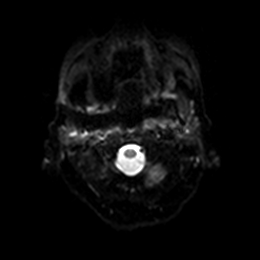
[im 12/90]
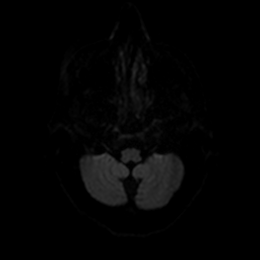
[im 23/90]
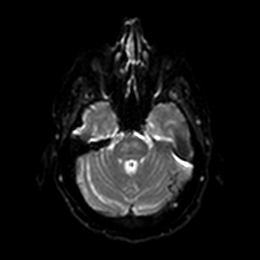
[im 34/90]
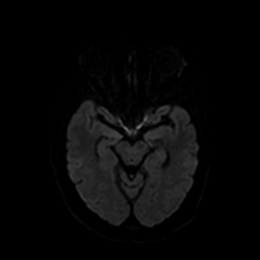
[im 45/90]
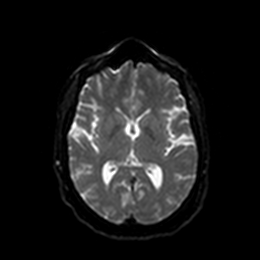
[im 56/90]
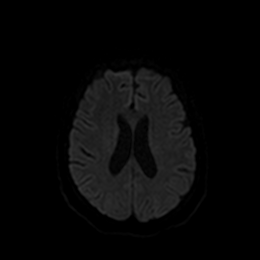
[im 67/90]
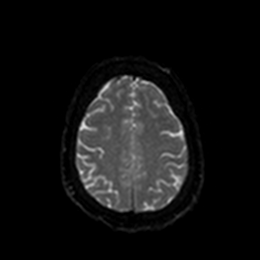
[im 78/90]
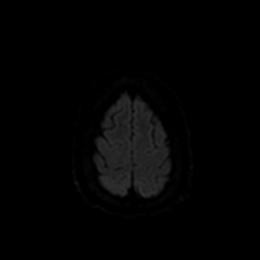
[im 90/90]
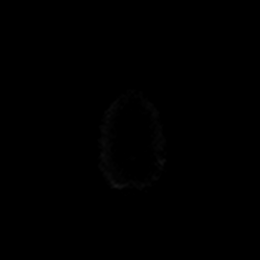

[Series 6: DWI · axial · 3.0mm · 0.88mm/px · z∈[-76,+54]mm · 4 of 45 slices shown (2 of 4)]
[im 1/45]
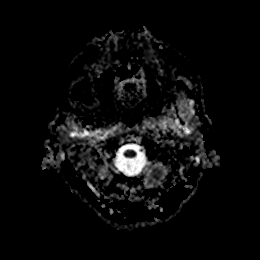
[im 15/45]
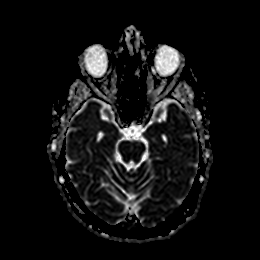
[im 30/45]
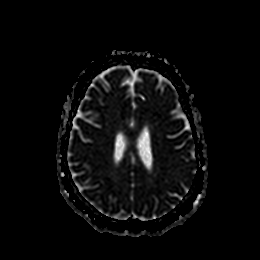
[im 45/45]
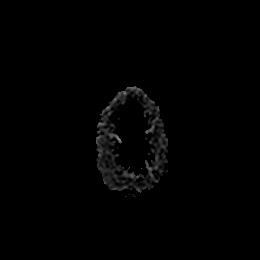

[Series 7: DWI · coronal · 4.0mm · 0.88mm/px · 6 of 64 slices shown (3 of 4)]
[im 1/64]
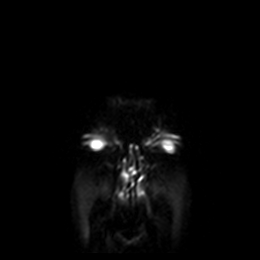
[im 13/64]
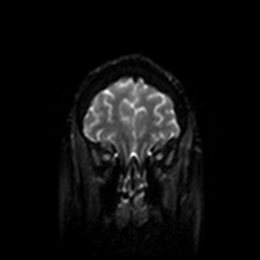
[im 26/64]
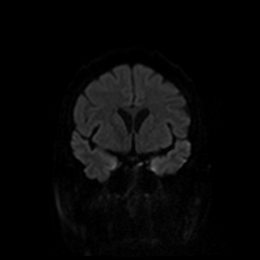
[im 38/64]
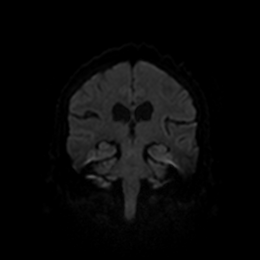
[im 51/64]
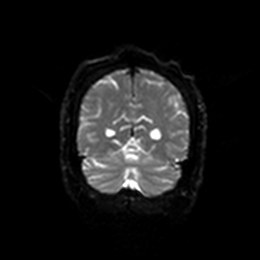
[im 64/64]
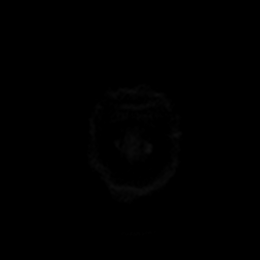

[Series 8: DWI · coronal · 4.0mm · 0.88mm/px · 3 of 32 slices shown (4 of 4)]
[im 1/32]
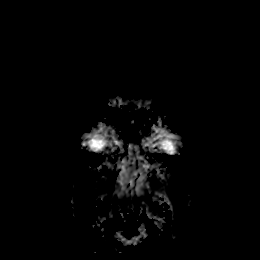
[im 16/32]
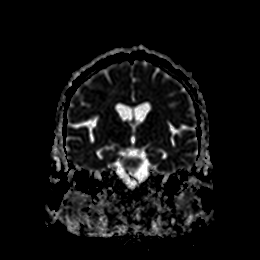
[im 32/32]
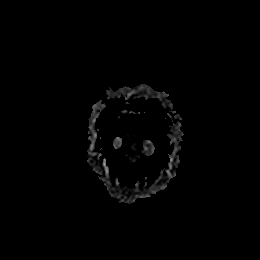

[Series 9: T1 · sagittal · 5.0mm · 0.75mm/px · 2 of 23 slices shown]
[im 1/23]
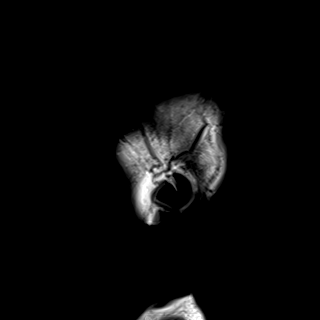
[im 23/23]
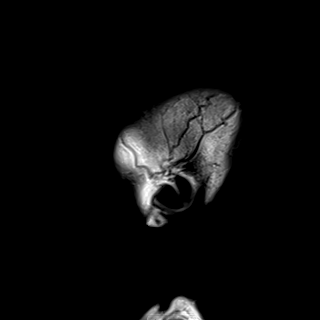

[Series 10: T2 · axial · 5.0mm · 0.72mm/px · z∈[-83,+60]mm · 2 of 25 slices shown (1 of 2)]
[im 1/25]
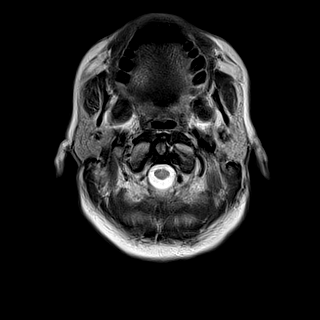
[im 25/25]
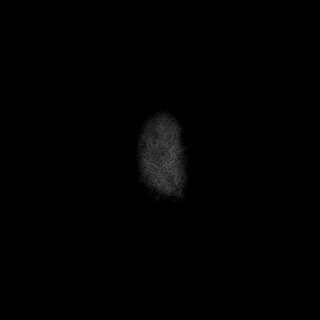

[Series 11: FLAIR · axial · 5.0mm · 0.45mm/px · z∈[-82,+60]mm · 2 of 25 slices shown]
[im 1/25]
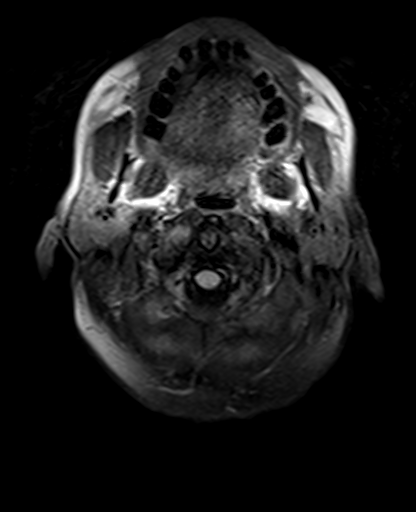
[im 25/25]
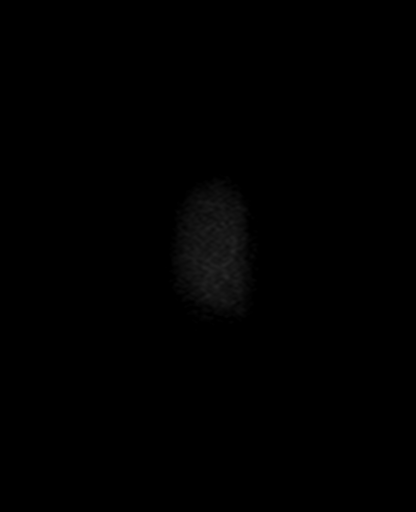

[Series 13: pha_images · axial · 3.0mm · 0.90mm/px · z∈[-95,+74]mm · 5 of 50 slices shown]
[im 1/50]
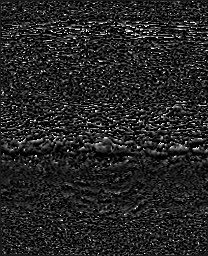
[im 13/50]
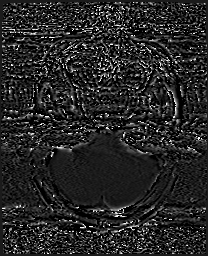
[im 25/50]
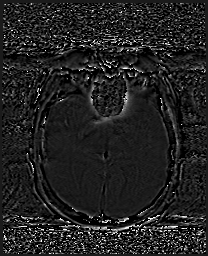
[im 37/50]
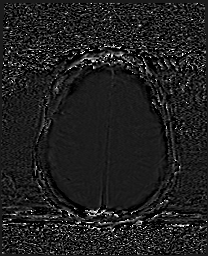
[im 50/50]
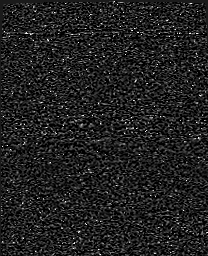

[Series 14: swi_images · axial · 3.0mm · 0.90mm/px · z∈[-101,+74]mm · 6 of 60 slices shown]
[im 1/60]
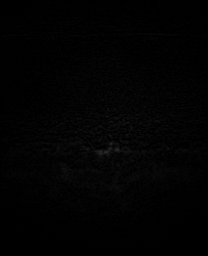
[im 12/60]
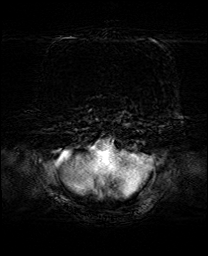
[im 24/60]
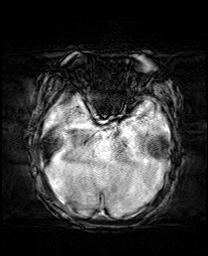
[im 36/60]
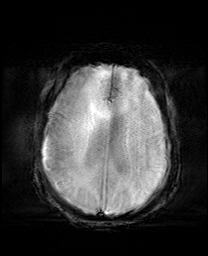
[im 48/60]
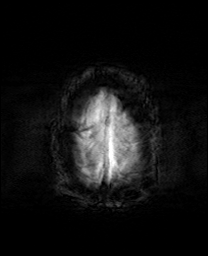
[im 60/60]
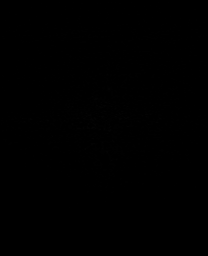

[Series 17: T2 · coronal · 5.0mm · 0.34mm/px · 3 of 29 slices shown (2 of 2)]
[im 1/29]
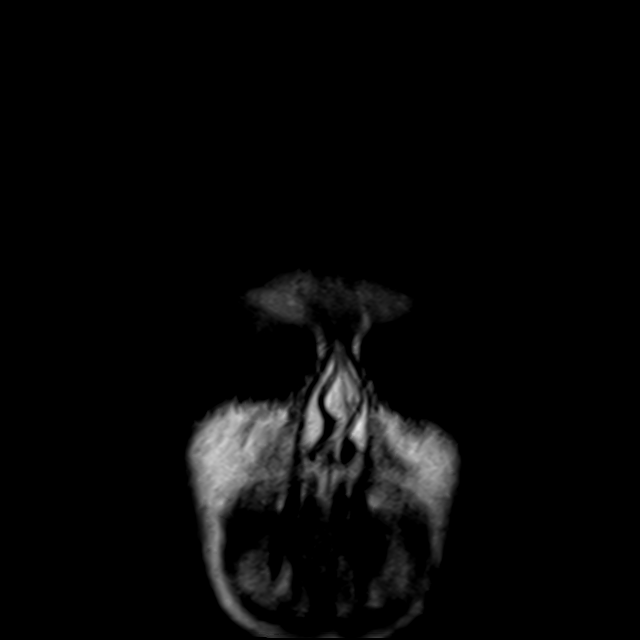
[im 15/29]
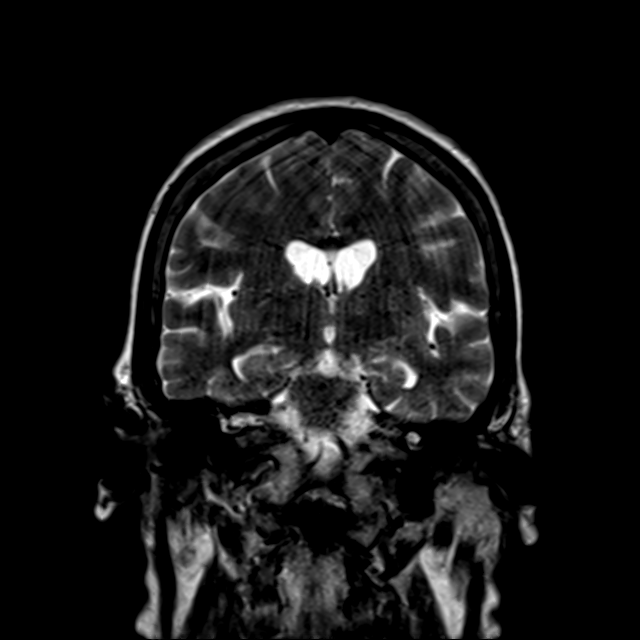
[im 29/29]
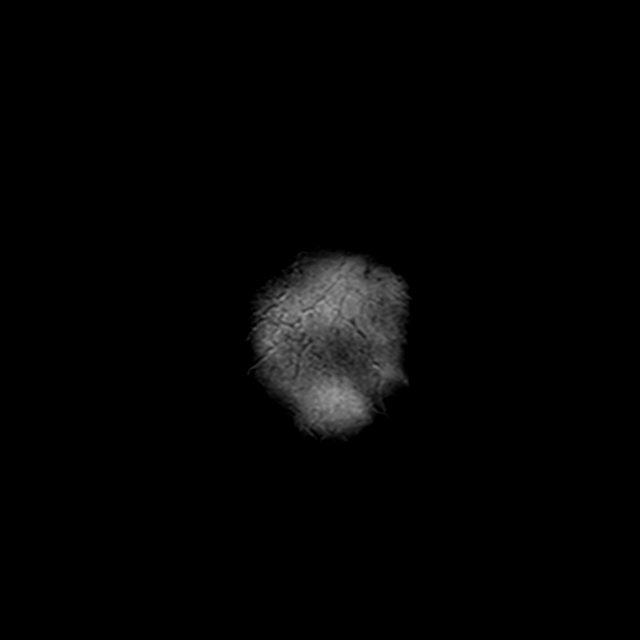

[42 of 48 positions shown; findings below may reference images not displayed]

FINDINGS: Brain: Examination moderately degraded by motion artifact.

Cerebral volume within normal limits for age. Patchy T2/FLAIR
hyperintensity within the pons, most like related chronic
microvascular ischemic disease, mild in nature.

No abnormal foci of restricted diffusion to suggest acute or
subacute ischemia. Gray-white matter differentiation maintained. No
encephalomalacia to suggest chronic cortical infarction. No foci of
susceptibility artifact to suggest acute or chronic intracranial
hemorrhage.

No mass lesion, midline shift or mass effect. No hydrocephalus. No
extra-axial fluid collection. Pituitary gland suprasellar region
normal. Midline structures intact.

Vascular: Major intracranial vascular flow voids are maintained.

Skull and upper cervical spine: Craniocervical junction within
normal limits. Upper cervical spine unremarkable. Bone marrow signal
intensity within normal limits. No scalp soft tissue abnormality.

Sinuses/Orbits: Patient status post bilateral ocular lens
replacement. Globes and orbital soft tissues demonstrate no acute
finding. Paranasal sinuses are largely clear. No mastoid effusion.
Inner ear structures grossly normal.

Other: None.
IMPRESSION: 1. No acute intracranial abnormality.
2. Patchy T2/FLAIR hyperintensity involving the pons, most likely
related to chronic small vessel ischemic disease, mild for age.

## 2019-08-09 IMAGING — US US RENAL
1 series · 14 of 25 positions shown · non-contrast
Comparison: [DATE]

CLINICAL DATA: Acute kidney injury

EXAM:
RENAL / URINARY TRACT ULTRASOUND COMPLETE

[Series 1: us renal · 14 of 26 slices shown]
[im 1/26]
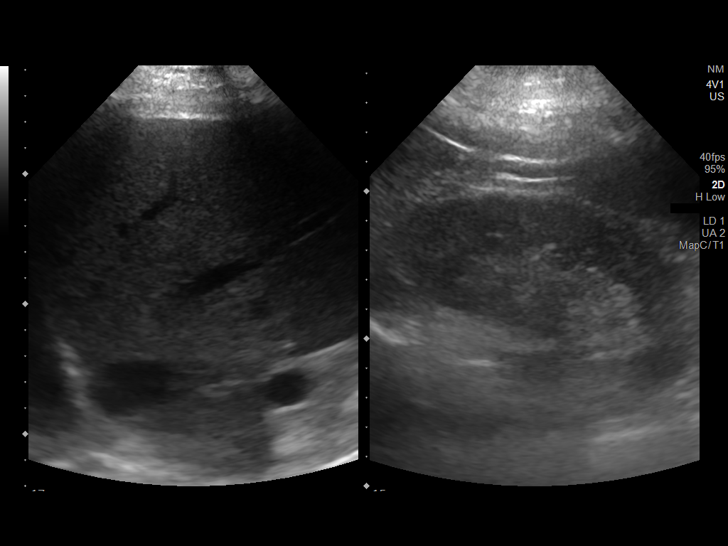
[im 3/26]
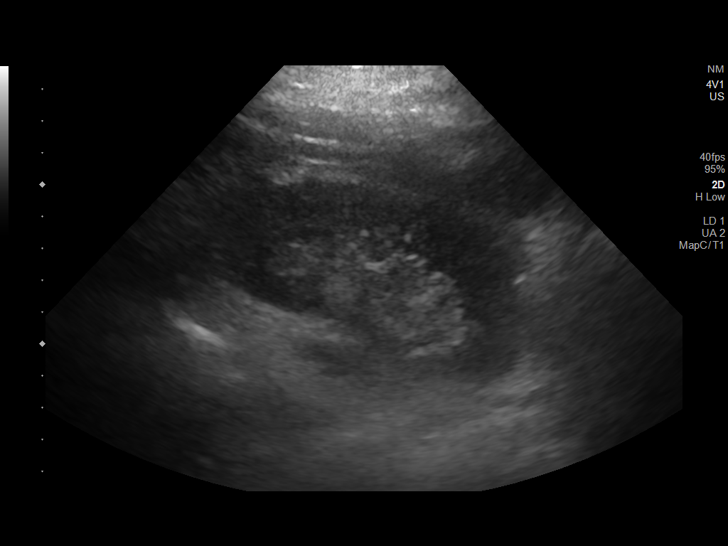
[im 5/26]
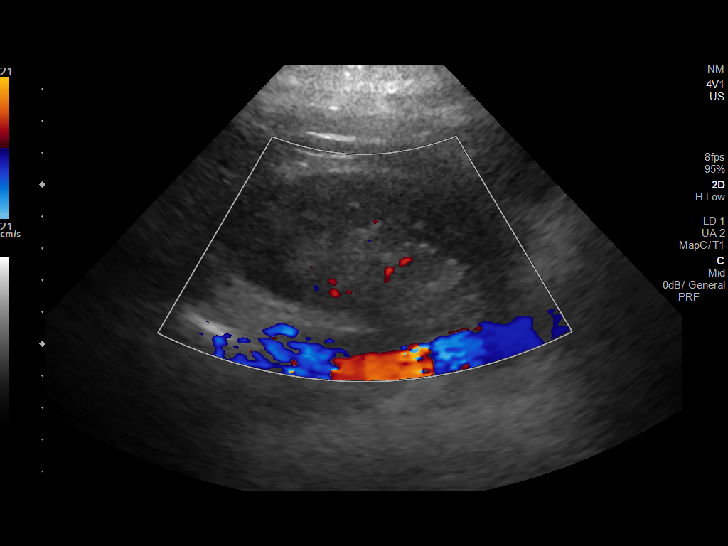
[im 7/26]
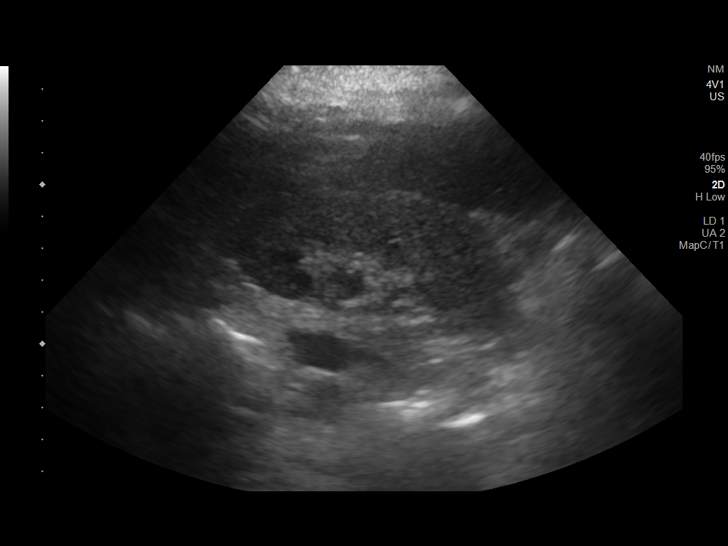
[im 9/26]
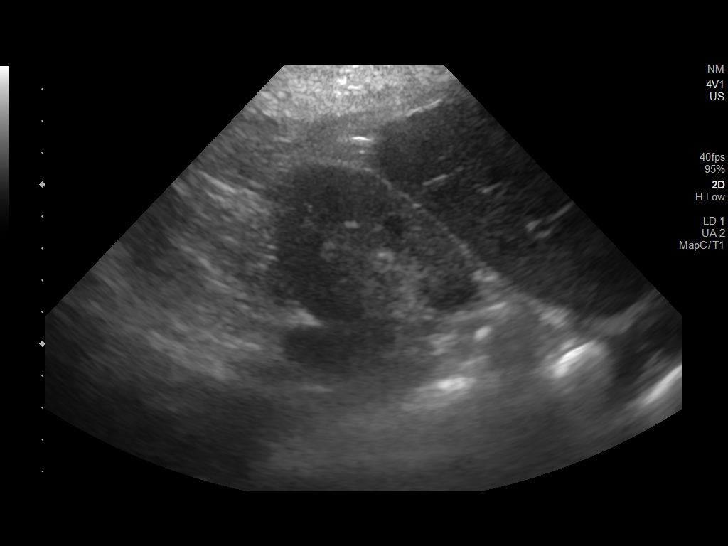
[im 10/26]
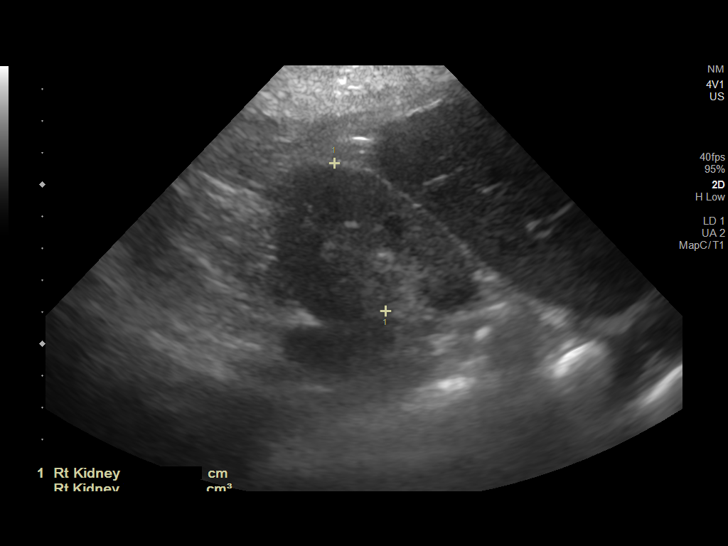
[im 12/26]
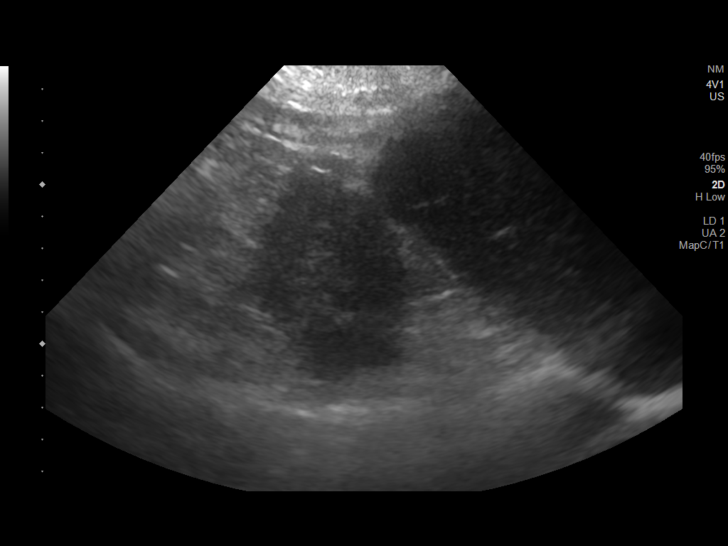
[im 14/26]
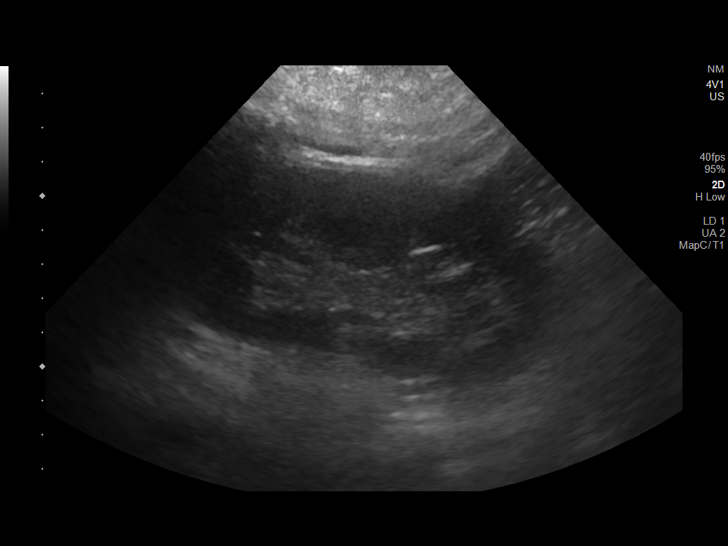
[im 16/26]
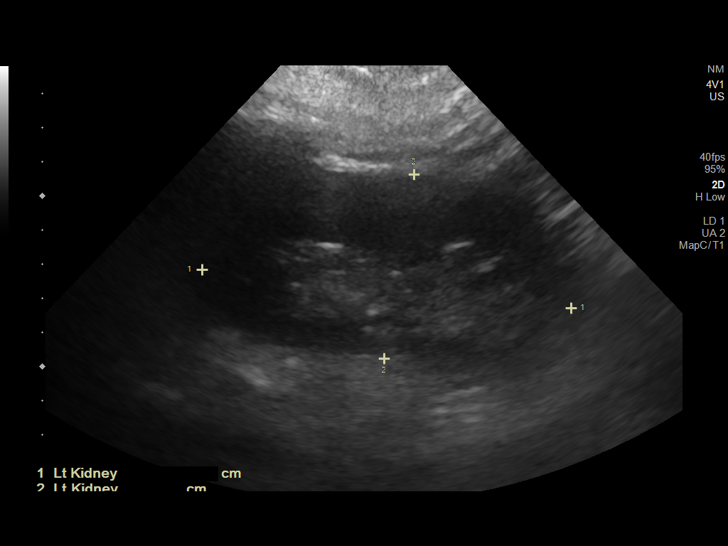
[im 17/26]
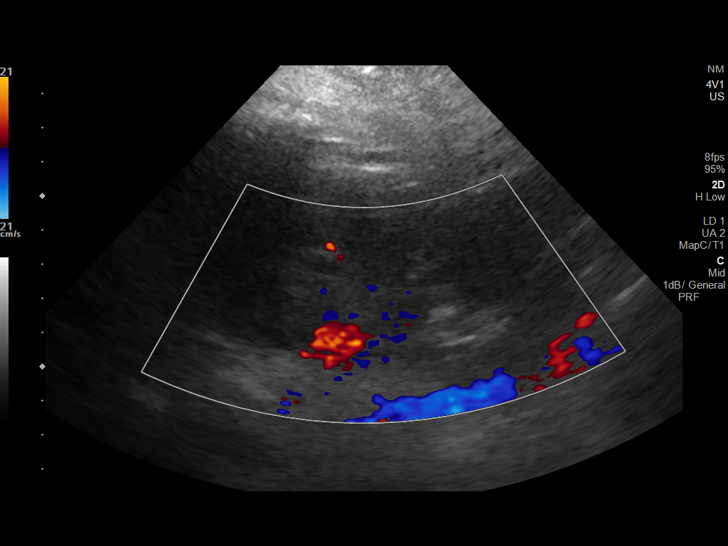
[im 19/26]
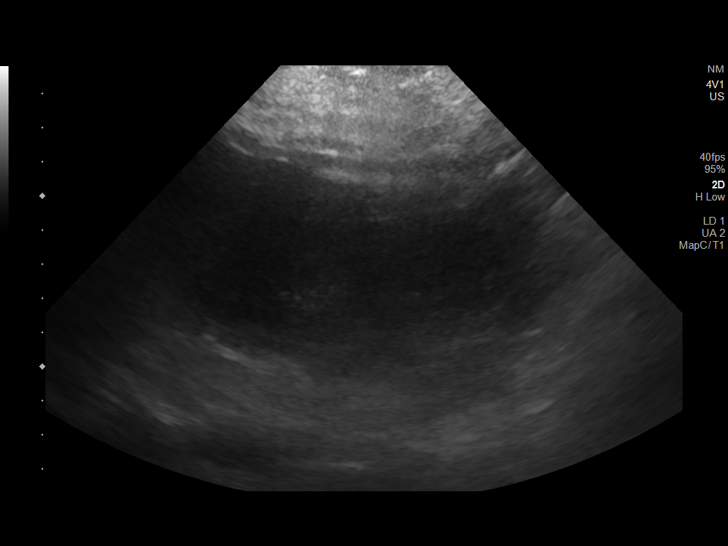
[im 21/26]
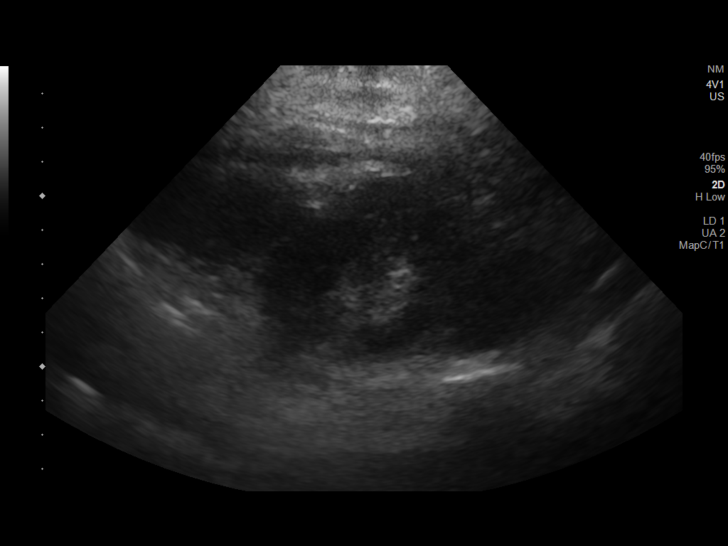
[im 23/26]
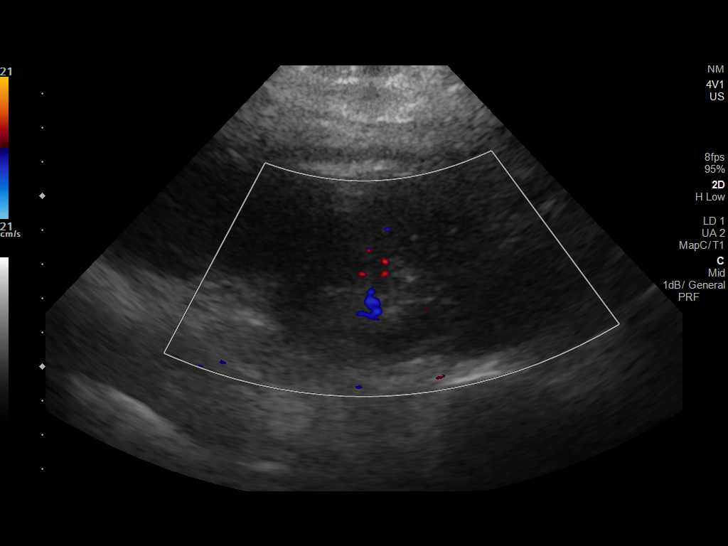
[im 26/26]
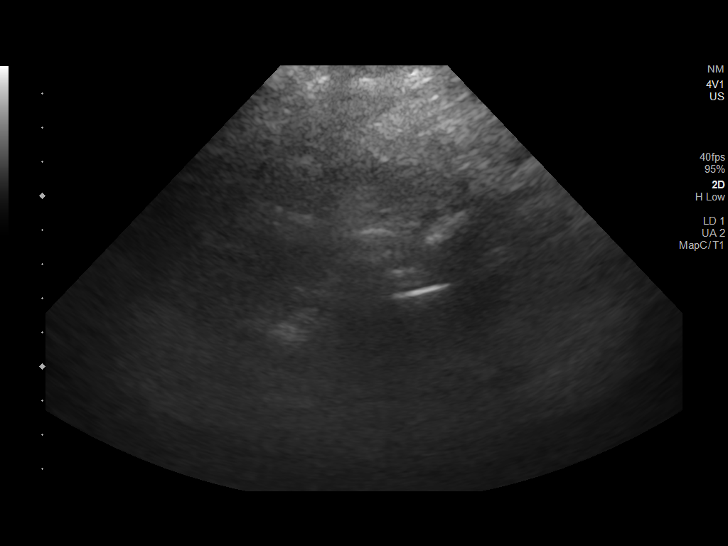

[14 of 25 positions shown; findings below may reference images not displayed]

FINDINGS: Right Kidney:

Renal measurements: 10.6 x 5.1 x 5 cm = volume: 136 mL .
Echogenicity within normal limits. No mass or hydronephrosis
visualized.

Left Kidney:

Renal measurements: 10.9 x 5.5 x 5.6 = volume: 175 mL. Echogenicity
within normal limits. No mass or hydronephrosis visualized.

Bladder:

Not visualized

Other:

None.
IMPRESSION: 1. No acute abnormality.  No hydronephrosis.
2. The bladder was not visualized on this exam.

## 2019-08-09 MED ORDER — SODIUM CHLORIDE 0.9% IV SOLUTION: Freq: Once | INTRAVENOUS | Status: AC

## 2019-08-09 MED ORDER — PANTOPRAZOLE SODIUM 40 MG PO TBEC
40.0000 mg | DELAYED_RELEASE_TABLET | Freq: Every day | ORAL | Status: DC
  Administered 2019-08-09 – 2019-08-24 (×15): 40 mg via ORAL
  Filled 2019-08-09 (×16): qty 1

## 2019-08-09 MED ORDER — SODIUM CHLORIDE 0.9 % IV SOLN: INTRAVENOUS | Status: DC

## 2019-08-09 NOTE — Evaluation (Signed)
Physical Therapy Evaluation Patient Details Name: Melanie Hall MRN: [id number] DOB: 22-Aug-1970 Today's Date: 08/09/2019   History of Present Illness  49 y.o. female with medical history significant of chronic kidney disease stage V, hypertension, hyperlipidemia, diabetes mellitus type 2 who presented to the ER with intermittent chest pain, uncontrolled hypertension. She complains of a gradual onset diffuse headache and R arm weakness. Pt also with hx of monoclonal gammopathy.  Clinical Impression  Pt presents to PT with deficits in gait, balance, strength, power, endurance, balance. Pt limited participant in evaluation due to fatigue and reports of no sleep. Pt ambulates to and from bathroom without the need of physical assistance, however is slightly unsteady in doing so. Pt will benefit from continued acute PT POC to improve balance, activity tolerance, and gait quality.    Follow Up Recommendations Home health PT;Supervision - Intermittent    Equipment Recommendations  None recommended by PT(pt owns necessary DME)    Recommendations for Other Services       Precautions / Restrictions Precautions Precautions: Fall Restrictions Weight Bearing Restrictions: No      Mobility  Bed Mobility Overal bed mobility: Modified Independent             General bed mobility comments: increased time, use of bed rail  Transfers Overall transfer level: Needs assistance Equipment used: Rolling walker (2 wheeled) Transfers: Sit to/from Stand Sit to Stand: Supervision            Ambulation/Gait Ambulation/Gait assistance: Min guard Gait Distance (Feet): 10 Feet(10' x 2 to anf from bathroom, refuses further ambulation) Assistive device: Rolling walker (2 wheeled) Gait Pattern/deviations: Step-to pattern;Trunk flexed Gait velocity: reduced Gait velocity interpretation: 1.31 - 2.62 ft/sec, indicative of limited community ambulator General Gait Details: pt with short step to gait  with increased trunk flexion over RW  Stairs            Wheelchair Mobility    Modified Rankin (Stroke Patients Only)       Balance Overall balance assessment: Needs assistance Sitting-balance support: No upper extremity supported;Feet supported Sitting balance-Leahy Scale: Normal     Standing balance support: Single extremity supported;During functional activity Standing balance-Leahy Scale: Good Standing balance comment: supervision                             Pertinent Vitals/Pain Pain Assessment: Faces Faces Pain Scale: Hurts even more Pain Location: low back and hips Pain Descriptors / Indicators: Grimacing Pain Intervention(s): Monitored during session;Limited activity within patient's tolerance    Home Living Family/patient expects to be discharged to:: Private residence Living Arrangements: Non-relatives/Friends;Children(roommate) Available Help at Discharge: Family;Available PRN/intermittently Type of Home: House Home Access: Stairs to enter Entrance Stairs-Rails: None Entrance Stairs-Number of Steps: 1 threshold Home Layout: One level Home Equipment: Hand held shower head;Walker - 2 wheels      Prior Function Level of Independence: Independent with assistive device(s)         Comments: pt reports returning to community ambulation with use of RW     Hand Dominance        Extremity/Trunk Assessment   Upper Extremity Assessment Upper Extremity Assessment: Overall WFL for tasks assessed    Lower Extremity Assessment Lower Extremity Assessment: Generalized weakness(R grossly 4/5, L grossly 4-/5. Peripheral neuropathy bilater)    Cervical / Trunk Assessment Cervical / Trunk Assessment: Normal  Communication   Communication: No difficulties  Cognition Arousal/Alertness: Awake/alert Behavior During Therapy: WFL for  tasks assessed/performed Overall Cognitive Status: Within Functional Limits for tasks assessed                                         General Comments General comments (skin integrity, edema, etc.): VSS on RA    Exercises     Assessment/Plan    PT Assessment Patient needs continued PT services  PT Problem List Decreased strength;Decreased activity tolerance;Decreased balance;Decreased mobility;Decreased knowledge of use of DME;Decreased safety awareness;Pain       PT Treatment Interventions DME instruction;Gait training;Stair training;Functional mobility training;Therapeutic activities;Therapeutic exercise;Balance training;Neuromuscular re-education;Patient/family education    PT Goals (Current goals can be found in the Care Plan section)  Acute Rehab PT Goals Patient Stated Goal: To go home and get some rest PT Goal Formulation: With patient Time For Goal Achievement: 08/23/19 Potential to Achieve Goals: Good Additional Goals Additional Goal #1: Pt will maintain dynamic standing balance within 10 inches of her base of support with unilateral UE support of the least restrictive device, modI.    Frequency Min 3X/week   Barriers to discharge        Co-evaluation               AM-PAC PT "6 Clicks" Mobility  Outcome Measure Help needed turning from your back to your side while in a flat bed without using bedrails?: None Help needed moving from lying on your back to sitting on the side of a flat bed without using bedrails?: None Help needed moving to and from a bed to a chair (including a wheelchair)?: A Little Help needed standing up from a chair using your arms (e.g., wheelchair or bedside chair)?: A Little Help needed to walk in hospital room?: A Little Help needed climbing 3-5 steps with a railing? : A Lot 6 Click Score: 19    End of Session Equipment Utilized During Treatment: (none) Activity Tolerance: Patient limited by fatigue Patient left: in bed;with call bell/phone within reach;with bed alarm set Nurse Communication: Mobility status PT Visit  Diagnosis: Unsteadiness on feet (R26.81);Muscle weakness (generalized) (M62.81)    Time: 2863-8177 PT Time Calculation (min) (ACUTE ONLY): 27 min   Charges:   PT Evaluation $PT Eval Moderate Complexity: 1 Mod          Zenaida Niece, PT, DPT Acute Rehabilitation Pager: [phone]   Zenaida Niece 08/09/2019, 11:36 AM

## 2019-08-09 NOTE — Progress Notes (Signed)
RN spoke to blood bank r/t readiness of product again t. Lab type and screen was drawn at 9am, result still processing, blood bank verbalized that she will call lab/phebotomy to see what is going on.

## 2019-08-09 NOTE — Plan of Care (Signed)
  Problem: Education: Goal: Knowledge of disease or condition will improve Outcome: Progressing   Problem: Nutrition: Goal: Dietary intake will improve Outcome: Progressing

## 2019-08-09 NOTE — Progress Notes (Signed)
PROGRESS NOTE    Melanie Hall  QQI:[id number] DOB: 11/17/1970 DOA: 08/08/2019 PCP: [facility] [place]   Brief Narrative:  Melanie Hall is a 49 y.o. female with medical history significant of chronic kidney disease stage V, hypertension, hyperlipidemia, diabetes mellitus type 2 who presented to the [facility] with intermittent chest pain, uncontrolled hypertension. Patient follows with nephrology as outpatient and was supposed to get an Epogen shot but was told she cannot get it as her blood pressure is over 941 systolic.  She complained of a gradual onset diffuse headache as well as right arm weakness which is been there for the past 2 weeks. She also has intermittent chest pain on the right side of her chest that comes and goes lasting for several minutes to hours at a time.here are some associated shortness of breath.  No PND.  No fever, chills, cough.  Upon arrival to ED, she was hemodynamically stable.  All the rest of the labs were fairly stable except her creatinine was 6.4, slightly worse from 5.19 on 07/23/2019.  Due to strokelike symptoms, she ended up having CT of the head which was unremarkable and subsequently she was transferred to [facility] in order to get MRI.  Assessment & Plan:   Principal Problem:   Acute on chronic renal failure (HCC) Active Problems:   Type 2 diabetes with nephropathy (HCC)   HYPERCHOLESTEROLEMIA   GERD (gastroesophageal reflux disease)   MGUS (monoclonal gammopathy of unknown significance)   Accelerated hypertension   Chest pain   Right arm weakness   Right arm weakness/headache: She does not complain of right arm weakness anymore and she has no focal neurological deficit.  CT head as well as MRI brain unremarkable for any stroke.  Acute kidney injury on chronic kidney disease stage IV/progressive chronic kidney disease: Her creatinine is worse than yesterday.  She came in with 6.40 and currently it is 6.76.  She has signs of rapidly  progressive chronic kidney disease.  Part of this could be due to dehydration.  Will start on IV fluids normal saline at 100 cc/h.  I have consulted nephrology as she also seems to have low urine output.  Diarrhea: Patient complains of having intermittent diarrhea for the past 3 months.  No mention of diarrhea in H&P.  Not sure if she ever mention to anybody else.  Will check C. difficile and GI pathogen as well as ova and parasite.  Acute on chronic anemia of chronic disease: Hemoglobin dropped from 7.5-6.2.  Will transfuse 1 unit PRBC, CMV negative and irradiated.  Will recheck H&H after that.  She tells me that she has seen black stools now and then.  Will check fecal occult blood test, iron studies, B12 and folate.   Acute on chronic low back pain: Patient complains of low back pain which is actually chronic but has been worse now.  Also complains of bilateral hip pain.  She also tells me that she sometimes cannot feel when she has to pass stool and she has incontinence.  Denies any saddle anesthesia or any lower extremity weakness.  This is concerning for possible spinal stenosis.  We will proceed with CT of the lumbar spine first.  Uncontrolled essential hypertension: Reportedly, she was unable to take her oral medications due to nausea and vomiting.  Blood pressure was uncontrolled upon presentation.  Now but better and within normal range.  Continue home dose of amlodipine and carvedilol.  Type 2 diabetes mellitus: Takes NovoLog 70/30  15 units 3 times daily at home.  Blood sugar fairly controlled with some elevation here and there.  Continue current regimen along with SSI.  Hyperlipidemia: Continue lovastatin.  Chronic bilateral pleural effusion: No hypoxia.  Monitor.  Chronic nonanion gap metabolic acidosis: Worsening, likely secondary to worsening renal function.  Consulting nephrology to see if they would start her on bicarb drip.  Monoclonal gammopathy of undetermined  significance: Patient is being followed by Dr. Delton Coombes from oncology service as outpatient. Recently noted to have nephrotic range proteinuria. Also found to have M spike of 0.2 g. Light chain ratio is mildly elevated at 2.36.  Plan was to have a skeletal survey and a repeat of myeloma labs including immunofixation.  Hypothyroidism: Continue Synthroid.  GERD: Continue PPI  DVT prophylaxis: Heparin Code Status: Full code Family Communication:  None present at bedside.  Plan of care discussed with patient in length and he verbalized understanding and agreed with it. Disposition Plan: To be determined based on her overall progression but likely will be discharged home with or without home health once improved and cleared by nephrology.  Estimated body mass index is 25.69 kg/m as calculated from the following:   Height as of this encounter: 5\' 3"  (1.6 m).   Weight as of this encounter: 65.8 kg.      Nutritional status:               Consultants:   Nephrology  Procedures:   None  Antimicrobials:   None   Subjective: Seen and examined.  She complains of worsening acute on chronic low back pain as well as bilateral hip pain.  Also tells me that she sometimes does not feel when she loses control on her bowels and that she also has intermittent diarrhea for past 3 months for which she has not sought any medical attention.  No other complaint.  Objective: Vitals:   08/09/19 0332 08/09/19 0530 08/09/19 0814 08/09/19 1100  BP: 104/63 137/83 123/73 115/70  Pulse: 74 84 95 70  Resp: 17 18 19 17   Temp: 98 F (36.7 C) 98 F (36.7 C) 97.8 F (36.6 C) 98.1 F (36.7 C)  TempSrc: Oral Oral Oral Oral  SpO2: 99% 97% 98% 96%  Weight:      Height:        Intake/Output Summary (Last 24 hours) at 08/09/2019 1300 Last data filed at 08/09/2019 1226 Gross per 24 hour  Intake 740 ml  Output --  Net 740 ml   Filed Weights   08/08/19 1532  Weight: 65.8 kg     Examination:  General exam: Appears calm and comfortable  Respiratory system: Clear to auscultation. Respiratory effort normal. Cardiovascular system: S1 & S2 heard, RRR. No JVD, murmurs, rubs, gallops or clicks. No pedal edema. Gastrointestinal system: Abdomen is nondistended, soft and nontender. No organomegaly or masses felt. Normal bowel sounds heard. Central nervous system: Alert and oriented. No focal neurological deficits. Extremities: Symmetric 5 x 5 power. Skin: No rashes, lesions or ulcers Psychiatry: Judgement and insight appear poor. Mood & affect flat and depressed   Data Reviewed: I have personally reviewed following labs and imaging studies  CBC: Recent Labs  Lab 08/06/19 1146 08/08/19 1607 08/09/19 0829  WBC  --  11.7* 8.0  NEUTROABS  --  8.8*  --   HGB 9.5* 7.5* 6.2*  HCT  --  22.9* 18.4*  MCV  --  94.6 92.0  PLT  --  193 403*   Basic Metabolic Panel:  Recent Labs  Lab 08/08/19 1607 08/09/19 0829  NA 133* 136  K 4.2 4.0  CL 106 107  CO2 16* 15*  GLUCOSE 263* 170*  BUN 73* 73*  CREATININE 6.40* 6.76*  CALCIUM 8.1* 7.7*  MG  --  1.7   GFR: Estimated Creatinine Clearance: 9.2 mL/min (A) (by C-G formula based on SCr of 6.76 mg/dL (H)). Liver Function Tests: Recent Labs  Lab 08/08/19 1607  AST 52*  ALT 47*  ALKPHOS 82  BILITOT 1.0  PROT 6.4*  ALBUMIN 2.5*   Recent Labs  Lab 08/08/19 1607  LIPASE 17   No results for input(s): AMMONIA in the last 168 hours. Coagulation Profile: No results for input(s): INR, PROTIME in the last 168 hours. Cardiac Enzymes: No results for input(s): CKTOTAL, CKMB, CKMBINDEX, TROPONINI in the last 168 hours. BNP (last 3 results) No results for input(s): PROBNP in the last 8760 hours. HbA1C: No results for input(s): HGBA1C in the last 72 hours. CBG: No results for input(s): GLUCAP in the last 168 hours. Lipid Profile: No results for input(s): CHOL, HDL, LDLCALC, TRIG, CHOLHDL, LDLDIRECT in the last 72  hours. Thyroid Function Tests: No results for input(s): TSH, T4TOTAL, FREET4, T3FREE, THYROIDAB in the last 72 hours. Anemia Panel: No results for input(s): VITAMINB12, FOLATE, FERRITIN, TIBC, IRON, RETICCTPCT in the last 72 hours. Sepsis Labs: No results for input(s): PROCALCITON, LATICACIDVEN in the last 168 hours.  Recent Results (from the past 240 hour(s))  Respiratory Panel by RT PCR (Flu A&B, Covid) - Nasopharyngeal Swab     Status: None   Collection Time: 08/08/19  5:50 PM   Specimen: Nasopharyngeal Swab  Result Value Ref Range Status   SARS Coronavirus 2 by RT PCR NEGATIVE NEGATIVE Final    Comment: (NOTE) SARS-CoV-2 target nucleic acids are NOT DETECTED. The SARS-CoV-2 RNA is generally detectable in upper respiratoy specimens during the acute phase of infection. The lowest concentration of SARS-CoV-2 viral copies this assay can detect is 131 copies/mL. A negative result does not preclude SARS-Cov-2 infection and should not be used as the sole basis for treatment or other patient management decisions. A negative result may occur with  improper specimen collection/handling, submission of specimen other than nasopharyngeal swab, presence of viral mutation(s) within the areas targeted by this assay, and inadequate number of viral copies (<131 copies/mL). A negative result must be combined with clinical observations, patient history, and epidemiological information. The expected result is Negative. Fact Sheet for Patients:  PinkCheek.be Fact Sheet for Healthcare Providers:  GravelBags.it This test is not yet ap proved or cleared by the [place] FDA and  has been authorized for detection and/or diagnosis of SARS-CoV-2 by FDA under an Emergency Use Authorization (EUA). This EUA will remain  in effect (meaning this test can be used) for the duration of the COVID-19 declaration under Section 564(b)(1) of the Act, 21  U.S.C. section 360bbb-3(b)(1), unless the authorization is terminated or revoked sooner.    Influenza A by PCR NEGATIVE NEGATIVE Final   Influenza B by PCR NEGATIVE NEGATIVE Final    Comment: (NOTE) The Xpert Xpress SARS-CoV-2/FLU/RSV assay is intended as an aid in  the diagnosis of influenza from Nasopharyngeal swab specimens and  should not be used as a sole basis for treatment. Nasal washings and  aspirates are unacceptable for Xpert Xpress SARS-CoV-2/FLU/RSV  testing. Fact Sheet for Patients: PinkCheek.be Fact Sheet for Healthcare Providers: GravelBags.it This test is not yet approved or cleared by the [place] and  has been authorized for detection and/or diagnosis of SARS-CoV-2 by  FDA under an Emergency Use Authorization (EUA). This EUA will remain  in effect (meaning this test can be used) for the duration of the  Covid-19 declaration under Section 564(b)(1) of the Act, 21  U.S.C. section 360bbb-3(b)(1), unless the authorization is  terminated or revoked. Performed at [facility], [address] [place], [place] [address]       Radiology Studies: CT Head Wo Contrast  Result Date: 08/08/2019 CLINICAL DATA:  Headache 2 days.  Hypertension.  Possible stroke. EXAM: CT HEAD WITHOUT CONTRAST TECHNIQUE: Contiguous axial images were obtained from the base of the skull through the vertex without intravenous contrast. COMPARISON:  None. FINDINGS: Brain: Ventricles, cisterns and other CSF spaces are normal. There is no mass, mass effect, shift of midline structures or acute hemorrhage. No evidence of acute infarction. Vascular: No hyperdense vessel or unexpected calcification. Skull: Normal. Negative for fracture or focal lesion. Sinuses/Orbits: No acute finding. Other: None. IMPRESSION: No acute findings. Electronically Signed   By: Marin Olp M.D.   On: 08/08/2019 17:31   MR BRAIN WO CONTRAST  Result Date:  08/09/2019 CLINICAL DATA:  Initial evaluation for acute encephalopathy.  TIA. EXAM: MRI HEAD WITHOUT CONTRAST TECHNIQUE: Multiplanar, multiecho pulse sequences of the brain and surrounding structures were obtained without intravenous contrast. COMPARISON:  Prior CT from 08/08/2019. FINDINGS: Brain: Examination moderately degraded by motion artifact. Cerebral volume within normal limits for age. Patchy T2/FLAIR hyperintensity within the pons, most like related chronic microvascular ischemic disease, mild in nature. No abnormal foci of restricted diffusion to suggest acute or subacute ischemia. Gray-white matter differentiation maintained. No encephalomalacia to suggest chronic cortical infarction. No foci of susceptibility artifact to suggest acute or chronic intracranial hemorrhage. No mass lesion, midline shift or mass effect. No hydrocephalus. No extra-axial fluid collection. Pituitary gland suprasellar region normal. Midline structures intact. Vascular: Major intracranial vascular flow voids are maintained. Skull and upper cervical spine: Craniocervical junction within normal limits. Upper cervical spine unremarkable. Bone marrow signal intensity within normal limits. No scalp soft tissue abnormality. Sinuses/Orbits: Patient status post bilateral ocular lens replacement. Globes and orbital soft tissues demonstrate no acute finding. Paranasal sinuses are largely clear. No mastoid effusion. Inner ear structures grossly normal. Other: None. IMPRESSION: 1. No acute intracranial abnormality. 2. Patchy T2/FLAIR hyperintensity involving the pons, most likely related to chronic small vessel ischemic disease, mild for age. Electronically Signed   By: Jeannine Boga M.D.   On: 08/09/2019 06:35   DG Chest Portable 1 View  Result Date: 08/08/2019 CLINICAL DATA:  Weakness EXAM: PORTABLE CHEST 1 VIEW COMPARISON:  02/28/2019 FINDINGS: There is a moderate right and small left pleural effusion. Bibasilar airspace  disease is noted and favored to represent atelectasis. There is no pneumothorax. The heart size is stable from prior study. There are few linear opacities in the left lower lung zone favored to represent atelectasis or scarring. There is vascular congestion. IMPRESSION: 1. Persistent bilateral pleural effusions, right greater than left. 2. Bibasilar airspace disease favored to represent atelectasis with an infiltrate not entirely excluded. Electronically Signed   By: Constance Holster M.D.   On: 08/08/2019 16:42    Scheduled Meds: . sodium chloride   Intravenous Once  . amLODipine  5 mg Oral Daily  . aspirin  300 mg Rectal Daily   Or  . aspirin  325 mg Oral Daily  . carvedilol  6.25 mg Oral BID WC  . gabapentin  100 mg  Oral BID  . heparin  5,000 Units Subcutaneous Q8H  . insulin aspart  0-9 Units Subcutaneous TID WC  . insulin aspart protamine- aspart  15 Units Subcutaneous TID AC  . levothyroxine  25 mcg Oral Daily  . pantoprazole  40 mg Oral QHS  . pravastatin  40 mg Oral q1800  . sodium bicarbonate  650 mg Oral BID   Continuous Infusions: . sodium chloride       LOS: 1 day   Time spent: 35 minutes   Darliss Cheney, MD [facility]  08/09/2019, 1:00 PM   To contact the attending provider between 7A-7P or the covering provider during after hours 7P-7A, please log into the web site [email or web address].

## 2019-08-09 NOTE — Progress Notes (Addendum)
RN called blood bank to confirmed readiness of blood product, it is not ready. Blood bank will notify RN when it is ready.    No bowel movement noted since shift, therefore, no stool sample to collect at this time.

## 2019-08-09 NOTE — Progress Notes (Signed)
  Echocardiogram 2D Echocardiogram has been performed.  Melanie Hall 08/09/2019, 1:25 PM

## 2019-08-09 NOTE — Progress Notes (Signed)
CRITICAL VALUE ALERT  Critical Value:  Hemoglobin 6.2  Per Chanetta Marshall  Date & Time Notied:  08/09/19 8307  Provider Notified: Pahwani  Orders Received/Actions taken: Waiting for orders.

## 2019-08-09 NOTE — Progress Notes (Addendum)
Blood consent signed. Pt denied previous blood transfusion. Transfusion reactions reviewed with patient. She verbalizes understanding. Consent in chart. Awaiting for blood bank to call for blood product.

## 2019-08-09 NOTE — Consult Note (Addendum)
Renal Service Consult Note [place] 08/09/2019 Sol Blazing Requesting Physician:  Dr Doristine Bosworth  Reason for Consult:  Renal failrue HPI: The patient is a 49 y.o. year-old w/ hx of DM2, HTN presented to ED on 1/30 reporting she ran out of BP meds for several weeks. Creat in ED was 6.4.   Followed by [facility] (Dr Theador Hawthorne). Renal bx scheduled for Feb 2021.  Also anemia w/ Hb 7.5. Had prbc's in April 2020 for Hb 7.8.  Had M-spike of 0.2gm in prior w/u.  Asked to see for renal failure.   Pt states she see Dr Theador Hawthorne in [place] for kidney failure and is sched for biopsy this Wed here at [facility].  She denies any sig SOB. No fatigue or jerking or confusion or sleepiness.  +nausea and vomiting for a few days.      Feb 2020 - admit for emphysematous cystitis, uncont DM, anemia, new HTN   April 2020 - admit for Rx of closed R femur fracture, underwent femoral nailing on 10/28/18    Aug 2020 - admit for acute on CRF, HL, HT, diarrhea. CKD III w/ nausea and diarrhea, creat 2.2 (BL 1.4), ipmroved w/ IVF"s.  DM2. ACEi dc'd w/ AKI.     ROS  denies CP  no joint pain   no HA  no blurry vision  no rash  no diarrhea  no dysuria  no difficulty voiding  no change in urine color    Past Medical History  Past Medical History:  Diagnosis Date  . Anemia   . Cataracts, bilateral    surgery to remove  . CKD (chronic kidney disease)   . Diabetes mellitus    type 2  . GERD (gastroesophageal reflux disease)   . Hypertension   . SVD (spontaneous vaginal delivery)    x 4   Past Surgical History  Past Surgical History:  Procedure Laterality Date  . EYE SURGERY Bilateral    cataracts removed  . FEMUR IM NAIL Left 10/28/2018   Procedure: RETROGRADE FEMORAL NAILING;  Surgeon: Meredith Pel, MD;  Location: [place];  Service: Orthopedics;  Laterality: Left;  . IM NAILING FEMORAL SHAFT RETROGRADE Left 10/28/2018  . KNEE SURGERY    . TUBAL LIGATION      Family History  Family History  Problem Relation Age of Onset  . Diabetes Mother   . Hypertension Mother   . Bipolar disorder Mother   . Brain cancer Maternal Aunt   . Heart disease Maternal Grandmother   . Diabetes Maternal Grandmother   . Breast cancer Maternal Aunt   . Diabetes Sister    Social History  reports that she quit smoking about 24 years ago. Her smoking use included cigarettes. She has never used smokeless tobacco. She reports that she does not drink alcohol or use drugs. Allergies No Known Allergies Home medications Prior to Admission medications   Medication Sig Start Date End Date Taking? Authorizing Provider  amLODipine (NORVASC) 5 MG tablet Take 1 tablet (5 mg total) by mouth daily. 03/01/19 02/29/20 Yes Barton Dubois, MD  carvedilol (COREG) 3.125 MG tablet Take 3.125 mg by mouth.  05/13/19 05/12/20 Yes [provider]  gabapentin (NEURONTIN) 100 MG capsule Take 1 capsule (100 mg total) by mouth 2 (two) times daily. 03/01/19  Yes Barton Dubois, MD  hydrochlorothiazide (HYDRODIURIL) 25 MG tablet Take 25 mg by mouth daily. 06/08/19  Yes [provider]  insulin aspart protamine- aspart (NOVOLOG  MIX 70/30) (70-30) 100 UNIT/ML injection Inject 15 Units into the skin 3 (three) times daily before meals.   Yes [provider]  lovastatin (MEVACOR) 40 MG tablet Take 40 mg by mouth at bedtime.    Yes [provider]  pantoprazole (PROTONIX) 40 MG tablet Take 1 tablet (40 mg total) by mouth daily. 08/28/18 08/28/19 Yes Barton Dubois, MD  sodium bicarbonate 650 MG tablet Take 650 mg by mouth 2 (two) times daily.  05/15/19  Yes [provider]  acetaminophen (TYLENOL) 325 MG tablet Take 2 tablets (650 mg total) by mouth every 6 (six) hours as needed for mild pain or headache (or Fever >/= 101). Patient not taking: Reported on 07/23/2019 08/28/18   Barton Dubois, MD  epoetin alfa (EPOGEN) 3000 UNIT/ML injection Inject 3,000 Units into the  skin every 14 (fourteen) days.  06/12/19   [provider]  levothyroxine (SYNTHROID) 25 MCG tablet Take 25 mcg by mouth daily. 03/10/19   [provider]   Recent Labs  Lab 08/06/19 1146 08/08/19 1607 08/09/19 0829  WBC  --  11.7* 8.0  NEUTROABS  --  8.8*  --   HGB 9.5* 7.5* 6.2*  HCT  --  22.9* 18.4*  MCV  --  94.6 92.0  PLT  --  193 138*   Recent Labs  Lab 08/08/19 1607 08/09/19 0829  NA 133* 136  K 4.2 4.0  CL 106 107  CO2 16* 15*  GLUCOSE 263* 170*  BUN 73* 73*  CREATININE 6.40* 6.76*  CALCIUM 8.1* 7.7*   Iron/TIBC/Ferritin/ %Sat    Component Value Date/Time   IRON 22 (L) 08/09/2019 1317   TIBC 203 (L) 08/09/2019 1317   FERRITIN 303 08/09/2019 1317   IRONPCTSAT 11 08/09/2019 1317    Vitals:   08/09/19 0530 08/09/19 0814 08/09/19 1100 08/09/19 1709  BP: 137/83 123/73 115/70 123/75  Pulse: 84 95 70 64  Resp: '18 19 17 18  ' Temp: 98 F (36.7 C) 97.8 F (36.6 C) 98.1 F (36.7 C) 97.8 F (36.6 C)  TempSrc: Oral Oral Oral Oral  SpO2: 97% 98% 96% 98%  Weight:      Height:        Exam Gen alert, WF no distress, tearful No rash, cyanosis or gangrene Sclera anicteric, throat clear  No jvd or bruits Chest dec'd in bases, occ rales RRR no MRG Abd soft ntnd no mass or ascites +bs GU defer MS no joint effusions or deformity Ext 1+ pretib edema, no wounds or ulcers Neuro is alert, Ox 3 , nf    Home meds:  - amlodipine 5 / carvedilol 6.25 bid/ hydrochlorothiazide 25  - insulin 70/30 15u tid sq  - gabapentin 100 bid  - sod bicarb 650 bid/ pantoprazole 40/ lovastatin 40 hs/ synthroid 25 ug  - prn's/ vitamins/ supplements     UA's from feb/ aug/ nov 2020 all showed prot > 300, few rbc's    Urine pcr from 11 /2020 =  11.8     05/27/2019 > 24 hr urine protein = 11.4 gm/ 24hr   CXR 08/08/19 > R greater than L effusions, prob bibasilar atx , no edema        Date   Creat  eGFR   2008- 2015  0.53- 0.98   Feb-Apr 2020 1.06- 1.39 39- >60   Aug  2020  2.10- 2.21 25-27   May 27, 2019  3.53  14   Jul 23, 2019  5.19  9  Jan 30  6.40   Aug 09, 2019  6.76  7      May 27, 2019 labs     C3/C4 normal    GBM - neg    ANA - positive 05/27/19    Hep B/ C neg     LC ratio 2.36 (K > L)     Spep = + small M-spike 0.2 gm     HIV neg     ANCA's - neg P/ C anca's       Jan 14,2021 labs      repeat SPEP w/ IFE neg for M-spike   Assessment/ Plan: 1. Renal failure - w/ nephrotic range proteinuria (11gm nov 2020), hx of IDDM, but progressing rapidly over last 12 mos.  Had +ANA , other serologies negative. Reorder ANA w/ titers. SPEP slightly + in Nov, then repeat 2 wks ago was negative. Admitted for rising creat 6.7 w/ N/V. Alb 2.5.  Would not recommend HD just yet as she is alert and w/o sig uremic symptoms, however she could progress. Needs renal bx, will consult IR, NPO after MN.  Has been vomiting x 2 days, okay for IVF"s for a day or so, lowered to 75 cc/hr.  2. Anemia - Hb 6.2, sig issue, per hem-onc. Getting prbc x 1 3. HTN/ vol - mild LE edema and small pleural effusions, no need for lasix at this time. No resp issues.       Kelly Splinter  MD 08/09/2019, 5:53 PM

## 2019-08-09 NOTE — Progress Notes (Signed)
New pt admitted from [facility] to 5C09 for TIA  Workup. On arrival, pt A/OX4 but with slightly  mental retarded. Vss taken wnl, see flowsheet. Oriented to unit and room. Cardiac monitor placed and CCMD notified.

## 2019-08-09 NOTE — Progress Notes (Signed)
OT Cancellation Note  Patient Details Name: Melanie Hall MRN: [id number] DOB: 09-25-1970   Cancelled Treatment:    Reason Eval/Treat Not Completed: Patient declined, no reason specified(Pt with low Hgb 6.2 and headache. OT to follow for eval)    Jefferey Pica OTR/L Acute Rehabilitation Services Pager: [phone] Office: [phone]   [name] C 08/09/2019, 9:55 AM

## 2019-08-09 NOTE — Progress Notes (Signed)
Blood transfusion started. Transfusion rx reviewed again with patient. She verbalizes understanding. Pre transfusion VSS. Will cont to monitor.

## 2019-08-10 ENCOUNTER — Inpatient Hospital Stay (HOSPITAL_COMMUNITY): Payer: Self-pay | Admitting: Hematology

## 2019-08-10 ENCOUNTER — Encounter (HOSPITAL_COMMUNITY): Payer: Self-pay | Admitting: Internal Medicine

## 2019-08-10 LAB — URINALYSIS, ROUTINE W REFLEX MICROSCOPIC
Bilirubin Urine: NEGATIVE
Glucose, UA: NEGATIVE mg/dL
Hgb urine dipstick: NEGATIVE
Ketones, ur: NEGATIVE mg/dL
Nitrite: NEGATIVE
Protein, ur: >=300 mg/dL — AB
Specific Gravity, Urine: 1.013 (ref 1.005–1.030)
pH: 7 (ref 5.0–8.0)

## 2019-08-10 LAB — TYPE AND SCREEN
ABO/RH(D): O NEG
Antibody Screen: NEGATIVE
Unit division: 0

## 2019-08-10 LAB — BPAM RBC
Blood Product Expiration Date: 202102222359
ISSUE DATE / TIME: 202101311825
Unit Type and Rh: 9500

## 2019-08-10 LAB — BASIC METABOLIC PANEL
Anion gap: 16 — ABNORMAL HIGH (ref 5–15)
BUN: 83 mg/dL — ABNORMAL HIGH (ref 6–20)
CO2: 14 mmol/L — ABNORMAL LOW (ref 22–32)
Calcium: 7.7 mg/dL — ABNORMAL LOW (ref 8.9–10.3)
Chloride: 106 mmol/L (ref 98–111)
Creatinine, Ser: 7 mg/dL — ABNORMAL HIGH (ref 0.44–1.00)
GFR calc Af Amer: 7 mL/min — ABNORMAL LOW (ref >60)
GFR calc non Af Amer: 6 mL/min — ABNORMAL LOW (ref >60)
Glucose, Bld: 103 mg/dL — ABNORMAL HIGH (ref 70–99)
Potassium: 4.5 mmol/L (ref 3.5–5.1)
Sodium: 136 mmol/L (ref 135–145)

## 2019-08-10 LAB — CBC
HCT: 22.3 % — ABNORMAL LOW (ref 36.0–46.0)
Hemoglobin: 7.3 g/dL — ABNORMAL LOW (ref 12.0–15.0)
MCH: 30 pg (ref 26.0–34.0)
MCHC: 32.7 g/dL (ref 30.0–36.0)
MCV: 91.8 fL (ref 80.0–100.0)
Platelets: 182 10*3/uL (ref 150–400)
RBC: 2.43 MIL/uL — ABNORMAL LOW (ref 3.87–5.11)
RDW: 15.4 % (ref 11.5–15.5)
WBC: 8.1 10*3/uL (ref 4.0–10.5)
nRBC: 0 % (ref 0.0–0.2)

## 2019-08-10 LAB — GLUCOSE, CAPILLARY
Glucose-Capillary: 170 mg/dL — ABNORMAL HIGH (ref 70–99)
Glucose-Capillary: 152 mg/dL — ABNORMAL HIGH (ref 70–99)
Glucose-Capillary: 126 mg/dL — ABNORMAL HIGH (ref 70–99)
Glucose-Capillary: 147 mg/dL — ABNORMAL HIGH (ref 70–99)
Glucose-Capillary: 105 mg/dL — ABNORMAL HIGH (ref 70–99)
Glucose-Capillary: 79 mg/dL (ref 70–99)
Glucose-Capillary: 154 mg/dL — ABNORMAL HIGH (ref 70–99)
Glucose-Capillary: 145 mg/dL — ABNORMAL HIGH (ref 70–99)

## 2019-08-10 MED ORDER — ASPIRIN-ACETAMINOPHEN-CAFFEINE 250-250-65 MG PO TABS
1.0000 | ORAL_TABLET | Freq: Once | ORAL | Status: AC
  Administered 2019-08-10: 11:00:00 1 via ORAL
  Filled 2019-08-10: qty 1

## 2019-08-10 MED ORDER — HEPARIN SODIUM (PORCINE) 5000 UNIT/ML IJ SOLN: 5000.0000 [IU] | Freq: Three times a day (TID) | INTRAMUSCULAR | Status: DC

## 2019-08-10 NOTE — Progress Notes (Signed)
PROGRESS NOTE    Melanie Hall  OVZ:[id number] DOB: [birth year] DOA: 08/08/2019 PCP: [facility] [place]   Brief Narrative:  Melanie Hall is a 49 y.o. female with medical history significant of chronic kidney disease stage V, hypertension, hyperlipidemia, diabetes mellitus type 2 who presented to the [facility] with intermittent chest pain, uncontrolled hypertension. Patient follows with nephrology as outpatient and was supposed to get an Epogen shot but was told she cannot get it as her blood pressure is over 412 systolic.  She complained of a gradual onset diffuse headache as well as right arm weakness which is been there for the past 2 weeks. She also has intermittent chest pain on the right side of her chest that comes and goes lasting for several minutes to hours at a time.here are some associated shortness of breath.  No PND.  No fever, chills, cough.  Upon arrival to ED, she was hemodynamically stable.  All the rest of the labs were fairly stable except her creatinine was 6.4, slightly worse from 5.19 on 07/23/2019.  Due to strokelike symptoms, she ended up having CT of the head which was unremarkable and subsequently she was transferred to [facility] in order to get MRI.  Assessment & Plan:   Principal Problem:   Acute on chronic renal failure (HCC) Active Problems:   Type 2 diabetes with nephropathy (HCC)   HYPERCHOLESTEROLEMIA   GERD (gastroesophageal reflux disease)   MGUS (monoclonal gammopathy of unknown significance)   Accelerated hypertension   Chest pain   Right arm weakness   Right arm weakness/headache: She does not complain of right arm weakness anymore and she has no focal neurological deficit.  CT head as well as MRI brain unremarkable for any stroke.  Acute kidney injury on chronic kidney disease stage IV/progressive chronic kidney disease: Her creatinine is worse than yesterday.  She came in with 6.40 >6.76>7.0.  She has signs of rapidly progressive chronic  kidney disease.  Part of this could be due to dehydration. Continue IV fluids. Seen by nephrology. Plan for renal biopsy by IR tomorrow.   Diarrhea: Patient complains of having intermittent diarrhea for the past 3 months.  No mention of diarrhea in H&P.  Not sure if she ever mention to anybody else. She has not had any bowel movement in last 48 hours. C. difficile, GI pathogen and ova and parasite were ordered.  Acute on chronic anemia of chronic disease: Hemoglobin dropped from 7.5-6.2. She received 1 unit of PRBC transfusion on 2019-08-09. Posttransfusion hemoglobin 8.0 and then dropped back to 7.3 today. Iron studies indicate anemia of chronic disease. FOBT pending.  Acute on chronic low back pain: Patient complains of low back pain which is actually chronic but has been worse now.  Also complains of bilateral hip pain.  She also told me that she sometimes cannot feel when she has to pass stool and she has incontinence.  Denied any saddle anesthesia or any lower extremity weakness. Due to concern of spinal stenosis, CT lumbar spine was obtained which showed L1 compression fracture is stable from the skeletal survey earlier this month. 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 And there is posttraumatic or degenerative gas in the adjacent L1-L2 disc. No significant retropulsion of bone or complicating features.  Uncontrolled essential hypertension: Blood pressure mostly controlled but fluctuates. Continue home dose of amlodipine and carvedilol.  Type 2 diabetes mellitus: Takes NovoLog 70/30 15 units 3 times daily at home.  Blood sugar  fairly controlled with some elevation here and there.  Continue current regimen along with SSI.  Hyperlipidemia: Continue lovastatin.  Chronic bilateral pleural effusion: No hypoxia.  Monitor.  Chronic nonanion gap metabolic acidosis: Worsening, likely secondary to worsening renal function.  Consulting nephrology to see if they  would start her on bicarb drip.  Monoclonal gammopathy of undetermined significance: Patient is being followed by Dr. Delton Coombes from oncology service as outpatient. Recently noted to have nephrotic range proteinuria. Also found to have M spike of 0.2 g. Light chain ratio is mildly elevated at 2.36.  Plan was to have a skeletal survey and a repeat of myeloma labs including immunofixation.  Hypothyroidism: Continue Synthroid.  GERD: Continue PPI  Migraine headache: No relief with Tylenol. Tells me that she takes Excedrin at home. Requesting a dose here. Will prescribe one dose.  DVT prophylaxis: Heparin Code Status: Full code Family Communication:  None present at bedside.  Plan of care discussed with patient in length and he verbalized understanding and agreed with it. Disposition Plan: Will be discharged home with home health once improved and cleared by nephrology.  Estimated body mass index is 25.69 kg/m as calculated from the following:   Height as of this encounter: 5\' 3"  (1.6 m).   Weight as of this encounter: 65.8 kg.      Nutritional status:               Consultants:   Nephrology  Procedures:   None  Antimicrobials:   None   Subjective: Seen and examined. Continues to complain of headache and low back pain. No other complaint. No bowel movement in last 2 days.  Objective: Vitals:   08/10/19 0025 08/10/19 0340 08/10/19 0735 08/10/19 0921  BP: (!) 152/87 104/67    Pulse: 71 67 68   Resp: 20 18  16   Temp: 97.7 F (36.5 C) 97.8 F (36.6 C)    TempSrc: Oral Oral    SpO2: 92% 100%  97%  Weight:      Height:        Intake/Output Summary (Last 24 hours) at 08/10/2019 1043 Last data filed at 08/10/2019 0341 Gross per 24 hour  Intake 960.9 ml  Output 400 ml  Net 560.9 ml   Filed Weights   08/08/19 1532  Weight: 65.8 kg    Examination:  General exam: Appears calm and comfortable  Respiratory system: Clear to auscultation. Respiratory  effort normal. Cardiovascular system: S1 & S2 heard, RRR. No JVD, murmurs, rubs, gallops or clicks. No pedal edema. Gastrointestinal system: Abdomen is nondistended, soft and nontender. No organomegaly or masses felt. Normal bowel sounds heard. Central nervous system: Alert and oriented. No focal neurological deficits. Extremities: Symmetric 5 x 5 power. Skin: No rashes, lesions or ulcers.  Psychiatry: Judgement and insight appear poor. Mood & affect flat.   Data Reviewed: I have personally reviewed following labs and imaging studies  CBC: Recent Labs  Lab 08/06/19 1146 08/08/19 1607 08/09/19 0829 08/09/19 2321 08/10/19 0635  WBC  --  11.7* 8.0  --  8.1  NEUTROABS  --  8.8*  --   --   --   HGB 9.5* 7.5* 6.2* 8.0* 7.3*  HCT  --  22.9* 18.4* 23.3* 22.3*  MCV  --  94.6 92.0  --  91.8  PLT  --  193 138*  --  950   Basic Metabolic Panel: Recent Labs  Lab 08/08/19 1607 08/09/19 0829 08/10/19 0635  NA 133* 136 136  K  4.2 4.0 4.5  CL 106 107 106  CO2 16* 15* 14*  GLUCOSE 263* 170* 103*  BUN 73* 73* 83*  CREATININE 6.40* 6.76* 7.00*  CALCIUM 8.1* 7.7* 7.7*  MG  --  1.7  --    GFR: Estimated Creatinine Clearance: 8.9 mL/min (A) (by C-G formula based on SCr of 7 mg/dL (H)). Liver Function Tests: Recent Labs  Lab 08/08/19 1607  AST 52*  ALT 47*  ALKPHOS 82  BILITOT 1.0  PROT 6.4*  ALBUMIN 2.5*   Recent Labs  Lab 08/08/19 1607  LIPASE 17   No results for input(s): AMMONIA in the last 168 hours. Coagulation Profile: No results for input(s): INR, PROTIME in the last 168 hours. Cardiac Enzymes: No results for input(s): CKTOTAL, CKMB, CKMBINDEX, TROPONINI in the last 168 hours. BNP (last 3 results) No results for input(s): PROBNP in the last 8760 hours. HbA1C: No results for input(s): HGBA1C in the last 72 hours. CBG: Recent Labs  Lab 08/09/19 0543 08/09/19 1134 08/09/19 1612 08/09/19 2156 08/10/19 0617  GLUCAP 170* 152* 126* 147* 105*   Lipid  Profile: No results for input(s): CHOL, HDL, LDLCALC, TRIG, CHOLHDL, LDLDIRECT in the last 72 hours. Thyroid Function Tests: No results for input(s): TSH, T4TOTAL, FREET4, T3FREE, THYROIDAB in the last 72 hours. Anemia Panel: Recent Labs    08/09/19 1317  VITAMINB12 830  FERRITIN 303  TIBC 203*  IRON 22*   Sepsis Labs: No results for input(s): PROCALCITON, LATICACIDVEN in the last 168 hours.  Recent Results (from the past 240 hour(s))  Respiratory Panel by RT PCR (Flu A&B, Covid) - Nasopharyngeal Swab     Status: None   Collection Time: 08/08/19  5:50 PM   Specimen: Nasopharyngeal Swab  Result Value Ref Range Status   SARS Coronavirus 2 by RT PCR NEGATIVE NEGATIVE Final    Comment: (NOTE) SARS-CoV-2 target nucleic acids are NOT DETECTED. The SARS-CoV-2 RNA is generally detectable in upper respiratoy specimens during the acute phase of infection. The lowest concentration of SARS-CoV-2 viral copies this assay can detect is 131 copies/mL. A negative result does not preclude SARS-Cov-2 infection and should not be used as the sole basis for treatment or other patient management decisions. A negative result may occur with  improper specimen collection/handling, submission of specimen other than nasopharyngeal swab, presence of viral mutation(s) within the areas targeted by this assay, and inadequate number of viral copies (<131 copies/mL). A negative result must be combined with clinical observations, patient history, and epidemiological information. The expected result is Negative. Fact Sheet for Patients:  PinkCheek.be Fact Sheet for Healthcare Providers:  GravelBags.it This test is not yet ap proved or cleared by the [place] FDA and  has been authorized for detection and/or diagnosis of SARS-CoV-2 by FDA under an Emergency Use Authorization (EUA). This EUA will remain  in effect (meaning this test can be used) for  the duration of the COVID-19 declaration under Section 564(b)(1) of the Act, 21 U.S.C. section 360bbb-3(b)(1), unless the authorization is terminated or revoked sooner.    Influenza A by PCR NEGATIVE NEGATIVE Final   Influenza B by PCR NEGATIVE NEGATIVE Final    Comment: (NOTE) The Xpert Xpress SARS-CoV-2/FLU/RSV assay is intended as an aid in  the diagnosis of influenza from Nasopharyngeal swab specimens and  should not be used as a sole basis for treatment. Nasal washings and  aspirates are unacceptable for Xpert Xpress SARS-CoV-2/FLU/RSV  testing. Fact Sheet for Patients: PinkCheek.be Fact Sheet for Healthcare Providers:  GravelBags.it This test is not yet approved or cleared by the [place] and  has been authorized for detection and/or diagnosis of SARS-CoV-2 by  FDA under an Emergency Use Authorization (EUA). This EUA will remain  in effect (meaning this test can be used) for the duration of the  Covid-19 declaration under Section 564(b)(1) of the Act, 21  U.S.C. section 360bbb-3(b)(1), unless the authorization is  terminated or revoked. Performed at [facility], [address], [place], [place] [address]       Radiology Studies: CT Head Wo Contrast  Result Date: 08/08/2019 CLINICAL DATA:  Headache 2 days.  Hypertension.  Possible stroke. EXAM: CT HEAD WITHOUT CONTRAST TECHNIQUE: Contiguous axial images were obtained from the base of the skull through the vertex without intravenous contrast. COMPARISON:  None. FINDINGS: Brain: Ventricles, cisterns and other CSF spaces are normal. There is no mass, mass effect, shift of midline structures or acute hemorrhage. No evidence of acute infarction. Vascular: No hyperdense vessel or unexpected calcification. Skull: Normal. Negative for fracture or focal lesion. Sinuses/Orbits: No acute finding. Other: None. IMPRESSION: No acute findings. Electronically Signed   By: Marin Olp M.D.   On: 08/08/2019 17:31   CT LUMBAR SPINE WO CONTRAST  Result Date: 08/09/2019 CLINICAL DATA:  49 year old female with increasing low back pain for several days. Monoclonal gammopathy of unknown significance. Osteoporosis. EXAM: CT LUMBAR SPINE WITHOUT CONTRAST TECHNIQUE: Multidetector CT imaging of the lumbar spine was performed without intravenous contrast administration. Multiplanar CT image reconstructions were also generated. COMPARISON:  Radiographic skeletal survey 07/23/2019. CT Abdomen and Pelvis 11/03/2018. FINDINGS: Segmentation: Normal. Alignment: Stable from the skeletal survey earlier this month. Relatively maintained lumbar lordosis. No spondylolisthesis. Vertebrae: Background diffuse osteopenia. Visible lower thoracic levels appear intact. Moderate to severe L1 compression fracture with anterior wedging stable from the radiographs earlier this month. Prominent gas within the fractured vertebral body and the adjacent L1-L2 disc. No significant retropulsion of bone. L1 posterior elements appear intact. Remaining lumbar levels appear intact. Visible sacrum and SI joints appear intact. Paraspinal and other soft tissues: Chronic cholelithiasis with a hazy appearance of gallbladder appears similar to the 2020 CT Abdomen and Pelvis. Pleural fluid at the costophrenic angles is also redemonstrated. There is a small to moderate volume of free fluid in the visible pelvis, cul-de-sac with simple fluid density (series 4, image 144). Superimposed extensive abdominal and pelvic visceral artery calcified atherosclerosis. Disc levels: Mild for age lumbar spine degeneration. IMPRESSION: 1. L1 compression fracture is stable from the skeletal survey earlier this month. 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 And there is posttraumatic or degenerative gas in the adjacent L1-L2 disc. No significant retropulsion of bone or complicating features. 2. Osteopenia. No  other acute osseous abnormality in the lumbar spine. 3. Nonspecific small volume of ascites in the visible pelvis. Pleural fluid visible in the costophrenic angles. 4. Chronic cholelithiasis. 5. Extensive calcified atherosclerosis. Electronically Signed   By: Genevie Ann M.D.   On: 08/09/2019 18:31   MR BRAIN WO CONTRAST  Result Date: 08/09/2019 CLINICAL DATA:  Initial evaluation for acute encephalopathy.  TIA. EXAM: MRI HEAD WITHOUT CONTRAST TECHNIQUE: Multiplanar, multiecho pulse sequences of the brain and surrounding structures were obtained without intravenous contrast. COMPARISON:  Prior CT from 08/08/2019. FINDINGS: Brain: Examination moderately degraded by motion artifact. Cerebral volume within normal limits for age. Patchy T2/FLAIR hyperintensity within the pons, most like related chronic microvascular ischemic disease, mild in nature. No abnormal foci of restricted  diffusion to suggest acute or subacute ischemia. Gray-white matter differentiation maintained. No encephalomalacia to suggest chronic cortical infarction. No foci of susceptibility artifact to suggest acute or chronic intracranial hemorrhage. No mass lesion, midline shift or mass effect. No hydrocephalus. No extra-axial fluid collection. Pituitary gland suprasellar region normal. Midline structures intact. Vascular: Major intracranial vascular flow voids are maintained. Skull and upper cervical spine: Craniocervical junction within normal limits. Upper cervical spine unremarkable. Bone marrow signal intensity within normal limits. No scalp soft tissue abnormality. Sinuses/Orbits: Patient status post bilateral ocular lens replacement. Globes and orbital soft tissues demonstrate no acute finding. Paranasal sinuses are largely clear. No mastoid effusion. Inner ear structures grossly normal. Other: None. IMPRESSION: 1. No acute intracranial abnormality. 2. Patchy T2/FLAIR hyperintensity involving the pons, most likely related to chronic small vessel  ischemic disease, mild for age. Electronically Signed   By: Jeannine Boga M.D.   On: 08/09/2019 06:35   US RENAL  Result Date: 08/09/2019 CLINICAL DATA:  Acute kidney injury EXAM: RENAL / URINARY TRACT ULTRASOUND COMPLETE COMPARISON:  03/05/2019 FINDINGS: Right Kidney: Renal measurements: 10.6 x 5.1 x 5 cm = volume: 136 mL . Echogenicity within normal limits. No mass or hydronephrosis visualized. Left Kidney: Renal measurements: 10.9 x 5.5 x 5.6 = volume: 175 mL. Echogenicity within normal limits. No mass or hydronephrosis visualized. Bladder: Not visualized Other: None. IMPRESSION: 1. No acute abnormality.  No hydronephrosis. 2. The bladder was not visualized on this exam. Electronically Signed   By: Constance Holster M.D.   On: 08/09/2019 22:49   DG Chest Portable 1 View  Result Date: 08/08/2019 CLINICAL DATA:  Weakness EXAM: PORTABLE CHEST 1 VIEW COMPARISON:  02/28/2019 FINDINGS: There is a moderate right and small left pleural effusion. Bibasilar airspace disease is noted and favored to represent atelectasis. There is no pneumothorax. The heart size is stable from prior study. There are few linear opacities in the left lower lung zone favored to represent atelectasis or scarring. There is vascular congestion. IMPRESSION: 1. Persistent bilateral pleural effusions, right greater than left. 2. Bibasilar airspace disease favored to represent atelectasis with an infiltrate not entirely excluded. Electronically Signed   By: Constance Holster M.D.   On: 08/08/2019 16:42   ECHOCARDIOGRAM COMPLETE  Result Date: 08/09/2019   ECHOCARDIOGRAM REPORT   Patient Name:   SURIYA KOVARIK Date of Exam: 08/09/2019 Medical Rec #:  [id number]     Height:       63.0 in Accession #:    [id number]    Weight:       145.0 lb Date of Birth:  Aug 27, 1970     BSA:          1.69 m Patient Age:    56 years      BP:           115/70 mmHg Patient Gender: F             HR:           70 bpm. Exam Location:  Inpatient Procedure: 2D  Echo Indications:    TIA 435.9  History:        Patient has no prior history of Echocardiogram examinations.                 Risk Factors:Hypertension and Diabetes.  Sonographer:    Jannett Celestine RDCS (AE) Referring Phys: [place]  1. Left ventricular ejection fraction, by visual estimation, is 65 to 70%. The left ventricle has hyperdynamic  function. There is no left ventricular hypertrophy.  2. Left ventricular diastolic parameters are indeterminate.  3. The left ventricle has no regional wall motion abnormalities.  4. Global right ventricle has normal systolic function.The right ventricular size is normal. No increase in right ventricular wall thickness.  5. Left atrial size was mildly dilated.  6. Right atrial size was normal.  7. The mitral valve is normal in structure. Trivial mitral valve regurgitation. No evidence of mitral stenosis.  8. The tricuspid valve is normal in structure.  9. The tricuspid valve is normal in structure. Tricuspid valve regurgitation is not demonstrated. 10. The aortic valve has an indeterminant number of cusps. Aortic valve regurgitation is not visualized. No evidence of aortic valve sclerosis or stenosis. 11. The pulmonic valve was not well visualized. Pulmonic valve regurgitation is not visualized. 12. The inferior vena cava is dilated in size with <50% respiratory variability, suggesting right atrial pressure of 15 mmHg. FINDINGS  Left Ventricle: Left ventricular ejection fraction, by visual estimation, is 65 to 70%. The left ventricle has hyperdynamic function. The left ventricle has no regional wall motion abnormalities. There is no left ventricular hypertrophy. Left ventricular diastolic parameters are indeterminate. Right Ventricle: The right ventricular size is normal. No increase in right ventricular wall thickness. Global RV systolic function is has normal systolic function. Left Atrium: Left atrial size was mildly dilated. Right Atrium: Right  atrial size was normal in size Pericardium: There is no evidence of pericardial effusion. Mitral Valve: The mitral valve is normal in structure. Trivial mitral valve regurgitation. No evidence of mitral valve stenosis by observation. Tricuspid Valve: The tricuspid valve is normal in structure. Tricuspid valve regurgitation is not demonstrated. Aortic Valve: The aortic valve has an indeterminant number of cusps. Aortic valve regurgitation is not visualized. The aortic valve is structurally normal, with no evidence of sclerosis or stenosis. Aortic valve mean gradient measures 3.2 mmHg. Aortic valve peak gradient measures 7.1 mmHg. Aortic valve area, by VTI measures 3.15 cm. Pulmonic Valve: The pulmonic valve was not well visualized. Pulmonic valve regurgitation is not visualized. Pulmonic regurgitation is not visualized. No evidence of pulmonic stenosis. Aorta: The aortic root is normal in size and structure. Venous: The inferior vena cava is dilated in size with less than 50% respiratory variability, suggesting right atrial pressure of 15 mmHg. IAS/Shunts: No atrial level shunt detected by color flow Doppler.  LEFT VENTRICLE PLAX 2D LVIDd:         4.40 cm  Diastology LVIDs:         2.60 cm  LV e' lateral:   6.42 cm/s LV PW:         1.00 cm  LV E/e' lateral: 13.1 LV IVS:        0.70 cm  LV e' medial:    6.74 cm/s LVOT diam:     2.10 cm  LV E/e' medial:  12.5 LV SV:         63 ml LV SV Index:   36.60 LVOT Area:     3.46 cm  RIGHT VENTRICLE RV S prime:     10.90 cm/s TAPSE (M-mode): 2.1 cm LEFT ATRIUM             Index       RIGHT ATRIUM           Index LA diam:        3.50 cm 2.08 cm/m  RA Area:     15.40 cm LA Vol (A2C):  52.3 ml 31.01 ml/m RA Volume:   38.50 ml  22.83 ml/m LA Vol (A4C):   48.6 ml 28.81 ml/m LA Biplane Vol: 50.4 ml 29.88 ml/m  AORTIC VALVE AV Area (Vmax):    2.73 cm AV Area (Vmean):   3.16 cm AV Area (VTI):     3.15 cm AV Vmax:           133.02 cm/s AV Vmean:          82.486 cm/s AV VTI:             0.227 m AV Peak Grad:      7.1 mmHg AV Mean Grad:      3.2 mmHg LVOT Vmax:         105.00 cm/s LVOT Vmean:        75.300 cm/s LVOT VTI:          0.206 m LVOT/AV VTI ratio: 0.91  AORTA Ao Root diam: 2.70 cm MITRAL VALVE MV Area (PHT): 2.80 cm             SHUNTS MV PHT:        78.59 msec           Systemic VTI:  0.21 m MV Decel Time: 271 msec             Systemic Diam: 2.10 cm MV E velocity: 84.00 cm/s 103 cm/s MV A velocity: 58.70 cm/s 70.3 cm/s MV E/A ratio:  1.43       1.5  Carlyle Dolly MD Electronically signed by Carlyle Dolly MD Signature Date/Time: 08/09/2019/1:42:30 PM    Final     Scheduled Meds: . amLODipine  5 mg Oral Daily  . aspirin-acetaminophen-caffeine  1 tablet Oral Once  . carvedilol  6.25 mg Oral BID WC  . gabapentin  100 mg Oral BID  . [START ON 08/11/2019] heparin  5,000 Units Subcutaneous Q8H  . insulin aspart  0-9 Units Subcutaneous TID WC  . insulin aspart protamine- aspart  15 Units Subcutaneous TID AC  . levothyroxine  25 mcg Oral Daily  . pantoprazole  40 mg Oral QHS  . pravastatin  40 mg Oral q1800  . sodium bicarbonate  650 mg Oral BID   Continuous Infusions: . sodium chloride 75 mL/hr at 08/10/19 0739     LOS: 2 days   Time spent: 30 minutes   Darliss Cheney, MD [facility]  08/10/2019, 10:43 AM   To contact the attending provider between 7A-7P or the covering provider during after hours 7P-7A, please log into the web site [email or web address].

## 2019-08-10 NOTE — Consult Note (Signed)
Chief Complaint: Patient was seen in consultation today for image guided random renal biopsy Chief Complaint  Patient presents with   Hypertension    Referring Physician(s): Schertz,R  Supervising Physician: Markus Daft  Patient Status: [facility] - In-pt  History of Present Illness: Melanie Hall is a 49 y.o. female with PMH anemia, HTN, CKD,DM,GERD who was admitted to [facility] 1/30 with worsening creatinine, N/V, dyspnea, diarrhea. She was on IR OP schedule for renal bx on 2/3. Request now received for random renal bx as IP.   Past Medical History:  Diagnosis Date   Anemia    Cataracts, bilateral    surgery to remove   CKD (chronic kidney disease)    Diabetes mellitus    type 2   GERD (gastroesophageal reflux disease)    Hypertension    SVD (spontaneous vaginal delivery)    x 4    Past Surgical History:  Procedure Laterality Date   EYE SURGERY Bilateral    cataracts removed   FEMUR IM NAIL Left 10/28/2018   Procedure: RETROGRADE FEMORAL NAILING;  Surgeon: Meredith Pel, MD;  Location: [place];  Service: Orthopedics;  Laterality: Left;   IM NAILING FEMORAL SHAFT RETROGRADE Left 10/28/2018   KNEE SURGERY     TUBAL LIGATION      Allergies: Patient has no known allergies.  Medications: Prior to Admission medications   Medication Sig Start Date End Date Taking? Authorizing Provider  amLODipine (NORVASC) 5 MG tablet Take 1 tablet (5 mg total) by mouth daily. 03/01/19 02/29/20 Yes Barton Dubois, MD  carvedilol (COREG) 3.125 MG tablet Take 3.125 mg by mouth.  05/13/19 05/12/20 Yes [provider]  gabapentin (NEURONTIN) 100 MG capsule Take 1 capsule (100 mg total) by mouth 2 (two) times daily. 03/01/19  Yes Barton Dubois, MD  hydrochlorothiazide (HYDRODIURIL) 25 MG tablet Take 25 mg by mouth daily. 06/08/19  Yes [provider]  insulin aspart protamine- aspart (NOVOLOG MIX 70/30) (70-30) 100 UNIT/ML injection Inject 15 Units into the skin 3  (three) times daily before meals.   Yes [provider]  lovastatin (MEVACOR) 40 MG tablet Take 40 mg by mouth at bedtime.    Yes [provider]  pantoprazole (PROTONIX) 40 MG tablet Take 1 tablet (40 mg total) by mouth daily. 08/28/18 08/28/19 Yes Barton Dubois, MD  sodium bicarbonate 650 MG tablet Take 650 mg by mouth 2 (two) times daily.  05/15/19  Yes [provider]  acetaminophen (TYLENOL) 325 MG tablet Take 2 tablets (650 mg total) by mouth every 6 (six) hours as needed for mild pain or headache (or Fever >/= 101). Patient not taking: Reported on 07/23/2019 08/28/18   Barton Dubois, MD  epoetin alfa (EPOGEN) 3000 UNIT/ML injection Inject 3,000 Units into the skin every 14 (fourteen) days.  06/12/19   [provider]  levothyroxine (SYNTHROID) 25 MCG tablet Take 25 mcg by mouth daily. 03/10/19   [provider]     Family History  Problem Relation Age of Onset   Diabetes Mother    Hypertension Mother    Bipolar disorder Mother    Brain cancer Maternal Aunt    Heart disease Maternal Grandmother    Diabetes Maternal Grandmother    Breast cancer Maternal Aunt    Diabetes Sister     Social History   Socioeconomic History   Marital status: Widowed    Spouse name: Not on file   Number of children: 4   Years of education: Not on  file   Highest education level: Not on file  Occupational History   Not on file  Tobacco Use   Smoking status: Former Smoker    Types: Cigarettes    Quit date: 1997    Years since quitting: 24.1   Smokeless tobacco: Never Used  Substance and Sexual Activity   Alcohol use: No   Drug use: No   Sexual activity: Yes    Birth control/protection: Surgical  Other Topics Concern   Not on file  Social History Narrative   Not on file   Social Determinants of Health   Financial Resource Strain:    Difficulty of Paying Living Expenses: Not on file  Food Insecurity:    Worried About Paediatric nurse in the Last Year: Not on file   [employer] of Food in the Last Year: Not on file  Transportation Needs:    Lack of Transportation (Medical): Not on file   Lack of Transportation (Non-Medical): Not on file  Physical Activity:    Days of Exercise per Week: Not on file   Minutes of Exercise per Session: Not on file  Stress:    Feeling of Stress : Not on file  Social Connections:    Frequency of Communication with Friends and Family: Not on file   Frequency of Social Gatherings with Friends and Family: Not on file   Attends Religious Services: Not on file   Active Member of Clubs or Organizations: Not on file   Attends Archivist Meetings: Not on file   Marital Status: Not on file      Review of Systems:  See above; denies fever, abd pain, bleeding  Vital Signs: BP 104/67 (BP Location: Right Arm)    Pulse 68    Temp 97.8 F (36.6 C) (Oral)    Resp 16    Ht '5\' 3"'  (1.6 m)    Wt 145 lb (65.8 kg)    LMP 12/17/2016    SpO2 97%    BMI 25.69 kg/m   Physical Exam awake /alert; appears pale; chest- dim BS bases; heart- RRR; abd- soft,+BS,NT; some rt pretibial edema, none on left  Imaging: CT Head Wo Contrast  Result Date: 08/08/2019 CLINICAL DATA:  Headache 2 days.  Hypertension.  Possible stroke. EXAM: CT HEAD WITHOUT CONTRAST TECHNIQUE: Contiguous axial images were obtained from the base of the skull through the vertex without intravenous contrast. COMPARISON:  None. FINDINGS: Brain: Ventricles, cisterns and other CSF spaces are normal. There is no mass, mass effect, shift of midline structures or acute hemorrhage. No evidence of acute infarction. Vascular: No hyperdense vessel or unexpected calcification. Skull: Normal. Negative for fracture or focal lesion. Sinuses/Orbits: No acute finding. Other: None. IMPRESSION: No acute findings. Electronically Signed   By: Marin Olp M.D.   On: 08/08/2019 17:31   CT LUMBAR SPINE WO CONTRAST  Result Date:  08/09/2019 CLINICAL DATA:  49 year old female with increasing low back pain for several days. Monoclonal gammopathy of unknown significance. Osteoporosis. EXAM: CT LUMBAR SPINE WITHOUT CONTRAST TECHNIQUE: Multidetector CT imaging of the lumbar spine was performed without intravenous contrast administration. Multiplanar CT image reconstructions were also generated. COMPARISON:  Radiographic skeletal survey 07/23/2019. CT Abdomen and Pelvis 11/03/2018. FINDINGS: Segmentation: Normal. Alignment: Stable from the skeletal survey earlier this month. Relatively maintained lumbar lordosis. No spondylolisthesis. Vertebrae: Background diffuse osteopenia. Visible lower thoracic levels appear intact. Moderate to severe L1 compression fracture with anterior wedging stable from the radiographs earlier this month. Prominent gas  within the fractured vertebral body and the adjacent L1-L2 disc. No significant retropulsion of bone. L1 posterior elements appear intact. Remaining lumbar levels appear intact. Visible sacrum and SI joints appear intact. Paraspinal and other soft tissues: Chronic cholelithiasis with a hazy appearance of gallbladder appears similar to the 2020 CT Abdomen and Pelvis. Pleural fluid at the costophrenic angles is also redemonstrated. There is a small to moderate volume of free fluid in the visible pelvis, cul-de-sac with simple fluid density (series 4, image 144). Superimposed extensive abdominal and pelvic visceral artery calcified atherosclerosis. Disc levels: Mild for age lumbar spine degeneration. IMPRESSION: 1. L1 compression fracture is stable from the skeletal survey earlier this month. 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 And there is posttraumatic or degenerative gas in the adjacent L1-L2 disc. No significant retropulsion of bone or complicating features. 2. Osteopenia. No other acute osseous abnormality in the lumbar spine. 3. Nonspecific small volume of  ascites in the visible pelvis. Pleural fluid visible in the costophrenic angles. 4. Chronic cholelithiasis. 5. Extensive calcified atherosclerosis. Electronically Signed   By: Genevie Ann M.D.   On: 08/09/2019 18:31   MR BRAIN WO CONTRAST  Result Date: 08/09/2019 CLINICAL DATA:  Initial evaluation for acute encephalopathy.  TIA. EXAM: MRI HEAD WITHOUT CONTRAST TECHNIQUE: Multiplanar, multiecho pulse sequences of the brain and surrounding structures were obtained without intravenous contrast. COMPARISON:  Prior CT from 08/08/2019. FINDINGS: Brain: Examination moderately degraded by motion artifact. Cerebral volume within normal limits for age. Patchy T2/FLAIR hyperintensity within the pons, most like related chronic microvascular ischemic disease, mild in nature. No abnormal foci of restricted diffusion to suggest acute or subacute ischemia. Gray-white matter differentiation maintained. No encephalomalacia to suggest chronic cortical infarction. No foci of susceptibility artifact to suggest acute or chronic intracranial hemorrhage. No mass lesion, midline shift or mass effect. No hydrocephalus. No extra-axial fluid collection. Pituitary gland suprasellar region normal. Midline structures intact. Vascular: Major intracranial vascular flow voids are maintained. Skull and upper cervical spine: Craniocervical junction within normal limits. Upper cervical spine unremarkable. Bone marrow signal intensity within normal limits. No scalp soft tissue abnormality. Sinuses/Orbits: Patient status post bilateral ocular lens replacement. Globes and orbital soft tissues demonstrate no acute finding. Paranasal sinuses are largely clear. No mastoid effusion. Inner ear structures grossly normal. Other: None. IMPRESSION: 1. No acute intracranial abnormality. 2. Patchy T2/FLAIR hyperintensity involving the pons, most likely related to chronic small vessel ischemic disease, mild for age. Electronically Signed   By: Jeannine Boga  M.D.   On: 08/09/2019 06:35   US RENAL  Result Date: 08/09/2019 CLINICAL DATA:  Acute kidney injury EXAM: RENAL / URINARY TRACT ULTRASOUND COMPLETE COMPARISON:  03/05/2019 FINDINGS: Right Kidney: Renal measurements: 10.6 x 5.1 x 5 cm = volume: 136 mL . Echogenicity within normal limits. No mass or hydronephrosis visualized. Left Kidney: Renal measurements: 10.9 x 5.5 x 5.6 = volume: 175 mL. Echogenicity within normal limits. No mass or hydronephrosis visualized. Bladder: Not visualized Other: None. IMPRESSION: 1. No acute abnormality.  No hydronephrosis. 2. The bladder was not visualized on this exam. Electronically Signed   By: Constance Holster M.D.   On: 08/09/2019 22:49   DG Bone Density  Result Date: 07/30/2019 EXAM: DUAL X-RAY ABSORPTIOMETRY (DXA) FOR BONE MINERAL DENSITY IMPRESSION: Your patient Melanie Hall completed a BMD test on 07/30/2019 using the [employer] (software version: 14.10) manufactured by [employer]. The following summarizes the results of our evaluation. Technologist: [facility] PATIENT  BIOGRAPHICAL: Name: Emonnie, Cannady Patient ID: [id number] Birth Date: Nov 16, 1970 Height: 62.0 in. Gender: Female Exam Date: 07/30/2019 Weight: 148.0 lbs. Indications: Caucasian, Diabetic-insulin dependent, Early Menopause, History of Fracture (Adult), Low Calcium Intake, Post Menopausal, Renal Fractures: Ankle, Knee Treatments: DENSITOMETRY RESULTS: Site      Region    Measured Date Measured Age WHO Classification Young Adult T-score BMD         %Change vs. Previous Significant Change (*) AP Spine L2-L3 07/30/2019 49.5 Osteopenia -1.4 1.034 g/cm2 - - DualFemur Neck Left 07/30/2019 49.5 Osteoporosis -3.4 0.564 g/cm2 - - ASSESSMENT: The BMD measured at Femur Neck Left is 0.564 g/cm2 with a T-score of -3.4. This patient is considered OSTEOPOROTIC according to [facility]) criteria. The scan quality is good. L1 and L4 were excluded due to advanced degenerative  changes. World Pharmacologist [facility]) criteria for post-menopausal, Caucasian Women: Normal:       T-score at or above -1 SD Osteopenia:   T-score between -1 and -2.5 SD Osteoporosis: T-score at or below -2.5 SD RECOMMENDATIONS: 1. All patients should optimize calcium and vitamin D intake. 2. Consider FDA-approved medical therapies in postmenopausal women and med aged 35 years and older, based on the following: a. A hip or vertebral (clinical or morphometric) fracture b. T-score < -2.5 at the femoral neck or spine after appropriate evaluation to exclude secondary causes c. Low bone mass (T-score between -1.0 and -2.5 at the femoral neck or spine) and a 10-year probability of a hip fracture > 3% or a 10-year probability of a major osteoporosis-related fracture > 20% based on the [place]-adapted WHO algorithm d. Clinician judgment and/or patient preferences may indicate treatment for people with 10-year fracture probabilities above or below these levels FOLLOW-UP: People with diagnosed cases of osteoporosis or at high risk for fracture should have regular bone mineral density tests. For patients eligible for Medicare, routine testing is allowed once every 2 years. The testing frequency can be increased to one year for patients who have rapidly progressing disease, those who are receiving or discontinuing medical therapy to restore bone mass, or have additional risk factors. I have reviewed this report, and agree with the above findings. Mark A. Thornton Papas, M.D. [facility] Electronically Signed   By: Lavonia Dana M.D.   On: 07/30/2019 13:28   DG Chest Portable 1 View  Result Date: 08/08/2019 CLINICAL DATA:  Weakness EXAM: PORTABLE CHEST 1 VIEW COMPARISON:  02/28/2019 FINDINGS: There is a moderate right and small left pleural effusion. Bibasilar airspace disease is noted and favored to represent atelectasis. There is no pneumothorax. The heart size is stable from prior study. There are few linear opacities  in the left lower lung zone favored to represent atelectasis or scarring. There is vascular congestion. IMPRESSION: 1. Persistent bilateral pleural effusions, right greater than left. 2. Bibasilar airspace disease favored to represent atelectasis with an infiltrate not entirely excluded. Electronically Signed   By: Constance Holster M.D.   On: 08/08/2019 16:42   DG Bone Survey Met  Result Date: 07/23/2019 CLINICAL DATA:  Monoclonal gammopathy of unknown significance. EXAM: METASTATIC BONE SURVEY COMPARISON:  Relevant previous radiographs and CT examinations. FINDINGS: Skull:  Normal. No destructive lesions. Cervical spine:  Normal. No destructive lesions. Thoracic spine: Minimal levoconvex scoliosis. Mild anterior spur formation in the lower thoracic spine. No destructive lesions, fractures or subluxations. Lumbar spine: Approximately 40% overall and 60% anterior compression deformity of the L1 vertebral body with patchy lucent areas within the anterior  2/3 of the vertebral body. None of these findings were present on the abdomen and pelvis CT dated 11/03/2018. No additional lytic areas. Mild anterior spur formation at multiple levels without significant change. Chest: Moderate-sized right pleural effusion and small left pleural effusion. Both are better defined than seen on 02/28/2019 and mildly increased. Small amount of linear scarring at the left lung base. Normal sized heart. Tortuous aorta. No lytic lesions. Bilateral shoulders:  Unremarkable. No lytic lesions. Bilateral humeri:  Unremarkable. No lytic lesions. Bilateral forearms: Atheromatous arterial calcifications bilaterally. No lytic lesions. Pelvis:  Bilateral arterial calcifications. No lytic lesions. Bilateral hips:  No lytic lesions. Bilateral femurs: Previously demonstrated screw and rod fixation of the left femur with a healed distal femoral fracture. No discrete lytic lesions. Bilateral lower legs:  No lytic lesions. IMPRESSION: 1. Interval  40% overall and 60% anterior compression deformity of the L1 vertebral body with interval vertebral body lucencies. This is suspicious for a pathological fracture, possibly due to myeloma or a bone metastasis. 2. No additional lytic lesions. 3. Moderate-sized right pleural effusion and small left pleural effusion, both mildly increased. Electronically Signed   By: Claudie Revering M.D.   On: 07/23/2019 14:20   ECHOCARDIOGRAM COMPLETE  Result Date: 08/09/2019   ECHOCARDIOGRAM REPORT   Patient Name:   Melanie Hall Date of Exam: 08/09/2019 Medical Rec #:  [id number]     Height:       63.0 in Accession #:    [id number]    Weight:       145.0 lb Date of Birth:  1971/01/08     BSA:          1.69 m Patient Age:    49 years      BP:           115/70 mmHg Patient Gender: F             HR:           70 bpm. Exam Location:  Inpatient Procedure: 2D Echo Indications:    TIA 435.9  History:        Patient has no prior history of Echocardiogram examinations.                 Risk Factors:Hypertension and Diabetes.  Sonographer:    Jannett Celestine RDCS (AE) Referring Phys: [place]  1. Left ventricular ejection fraction, by visual estimation, is 65 to 70%. The left ventricle has hyperdynamic function. There is no left ventricular hypertrophy.  2. Left ventricular diastolic parameters are indeterminate.  3. The left ventricle has no regional wall motion abnormalities.  4. Global right ventricle has normal systolic function.The right ventricular size is normal. No increase in right ventricular wall thickness.  5. Left atrial size was mildly dilated.  6. Right atrial size was normal.  7. The mitral valve is normal in structure. Trivial mitral valve regurgitation. No evidence of mitral stenosis.  8. The tricuspid valve is normal in structure.  9. The tricuspid valve is normal in structure. Tricuspid valve regurgitation is not demonstrated. 10. The aortic valve has an indeterminant number of cusps. Aortic valve  regurgitation is not visualized. No evidence of aortic valve sclerosis or stenosis. 11. The pulmonic valve was not well visualized. Pulmonic valve regurgitation is not visualized. 12. The inferior vena cava is dilated in size with <50% respiratory variability, suggesting right atrial pressure of 15 mmHg. FINDINGS  Left Ventricle: Left ventricular ejection fraction, by visual estimation, is  65 to 70%. The left ventricle has hyperdynamic function. The left ventricle has no regional wall motion abnormalities. There is no left ventricular hypertrophy. Left ventricular diastolic parameters are indeterminate. Right Ventricle: The right ventricular size is normal. No increase in right ventricular wall thickness. Global RV systolic function is has normal systolic function. Left Atrium: Left atrial size was mildly dilated. Right Atrium: Right atrial size was normal in size Pericardium: There is no evidence of pericardial effusion. Mitral Valve: The mitral valve is normal in structure. Trivial mitral valve regurgitation. No evidence of mitral valve stenosis by observation. Tricuspid Valve: The tricuspid valve is normal in structure. Tricuspid valve regurgitation is not demonstrated. Aortic Valve: The aortic valve has an indeterminant number of cusps. Aortic valve regurgitation is not visualized. The aortic valve is structurally normal, with no evidence of sclerosis or stenosis. Aortic valve mean gradient measures 3.2 mmHg. Aortic valve peak gradient measures 7.1 mmHg. Aortic valve area, by VTI measures 3.15 cm. Pulmonic Valve: The pulmonic valve was not well visualized. Pulmonic valve regurgitation is not visualized. Pulmonic regurgitation is not visualized. No evidence of pulmonic stenosis. Aorta: The aortic root is normal in size and structure. Venous: The inferior vena cava is dilated in size with less than 50% respiratory variability, suggesting right atrial pressure of 15 mmHg. IAS/Shunts: No atrial level shunt detected  by color flow Doppler.  LEFT VENTRICLE PLAX 2D LVIDd:         4.40 cm  Diastology LVIDs:         2.60 cm  LV e' lateral:   6.42 cm/s LV PW:         1.00 cm  LV E/e' lateral: 13.1 LV IVS:        0.70 cm  LV e' medial:    6.74 cm/s LVOT diam:     2.10 cm  LV E/e' medial:  12.5 LV SV:         63 ml LV SV Index:   36.60 LVOT Area:     3.46 cm  RIGHT VENTRICLE RV S prime:     10.90 cm/s TAPSE (M-mode): 2.1 cm LEFT ATRIUM             Index       RIGHT ATRIUM           Index LA diam:        3.50 cm 2.08 cm/m  RA Area:     15.40 cm LA Vol (A2C):   52.3 ml 31.01 ml/m RA Volume:   38.50 ml  22.83 ml/m LA Vol (A4C):   48.6 ml 28.81 ml/m LA Biplane Vol: 50.4 ml 29.88 ml/m  AORTIC VALVE AV Area (Vmax):    2.73 cm AV Area (Vmean):   3.16 cm AV Area (VTI):     3.15 cm AV Vmax:           133.02 cm/s AV Vmean:          82.486 cm/s AV VTI:            0.227 m AV Peak Grad:      7.1 mmHg AV Mean Grad:      3.2 mmHg LVOT Vmax:         105.00 cm/s LVOT Vmean:        75.300 cm/s LVOT VTI:          0.206 m LVOT/AV VTI ratio: 0.91  AORTA Ao Root diam: 2.70 cm MITRAL VALVE MV Area (PHT): 2.80 cm  SHUNTS MV PHT:        78.59 msec           Systemic VTI:  0.21 m MV Decel Time: 271 msec             Systemic Diam: 2.10 cm MV E velocity: 84.00 cm/s 103 cm/s MV A velocity: 58.70 cm/s 70.3 cm/s MV E/A ratio:  1.43       1.5  Carlyle Dolly MD Electronically signed by Carlyle Dolly MD Signature Date/Time: 08/09/2019/1:42:30 PM    Final     Labs:  CBC: Recent Labs    07/23/19 1130 08/06/19 1146 08/08/19 1607 08/09/19 0829 08/09/19 2321 08/10/19 0635  WBC 8.3  --  11.7* 8.0  --  8.1  HGB 9.9*   < > 7.5* 6.2* 8.0* 7.3*  HCT 29.6*  --  22.9* 18.4* 23.3* 22.3*  PLT 208  --  193 138*  --  182   < > = values in this interval not displayed.    COAGS: No results for input(s): INR, APTT in the last 8760 hours.  BMP: Recent Labs    07/23/19 1130 08/08/19 1607 08/09/19 0829 08/10/19 0635  NA 137 133* 136  136  K 4.1 4.2 4.0 4.5  CL 109 106 107 106  CO2 19* 16* 15* 14*  GLUCOSE 146* 263* 170* 103*  BUN 40* 73* 73* 83*  CALCIUM 8.4* 8.1* 7.7* 7.7*  CREATININE 5.19* 6.40* 6.76* 7.00*  GFRNONAA 9* 7* 7* 6*  GFRAA 10* 8* 8* 7*    LIVER FUNCTION TESTS: Recent Labs    02/27/19 2221 05/27/19 1314 07/23/19 1130 08/08/19 1607  BILITOT 0.6 0.7 0.9 1.0  AST 15 17 12* 52*  ALT '16 21 11 ' 47*  ALKPHOS 123 123 100 82  PROT 5.7* 6.6 7.3 6.4*  ALBUMIN 2.2* 2.8* 3.0* 2.5*    TUMOR MARKERS: No results for input(s): AFPTM, CEA, CA199, CHROMGRNA in the last 8760 hours.  Assessment and Plan: 49 y.o. female with PMH anemia, HTN, CKD,DM,GERD who was admitted to [facility] 1/30 with worsening creatinine, N/V, dyspnea, diarrhea. She was on IR OP schedule for renal bx on 2/3. Request now received for random renal bx as IP.Risks and benefits of procedure was discussed with the patient including, but not limited to bleeding, infection, damage to adjacent structures or low yield requiring additional tests.  All of the questions were answered and there is agreement to proceed.  Consent signed and in chart.  Will need to hold heparin injections until after renal bx; procedure tent planned for 2/2   Thank you for this interesting consult.  I greatly enjoyed meeting Melanie Hall and look forward to participating in their care.  A copy of this report was sent to the requesting provider on this date.  Electronically Signed: D. Rowe Robert, PA-C 08/10/2019, 9:32 AM   I spent a total of 25 minutes    in face to face in clinical consultation, greater than 50% of which was counseling/coordinating care for image guided random renal biopsy

## 2019-08-10 NOTE — Progress Notes (Cosign Needed)
PA STUDENT NOTE FOR EDUCATIONAL PURPOSES       Melanie Hall Progress Note    Assessment/ Plan:   1. Acute Kidney Injury  -trending Cr: 6.4 > 6.76 > 7 today  -GFR: 7 > 6 today  -trending Ca: 8.1 > 7.7 > 7.7 today   -patient has unexplained rapidly progressing renal failure, baseline Cr has increased over past year from 1.4 to 2.2  -renal biopsy tomorrow by IR  -Nov 2020 labs:    24 hr Urine protein 11,400mg    M spike of 0.2gm (MGUS) 2. Anemia  -PRBCs on 1/31 for Hb 6.2, without adverse reaction  -trending Hb: 6.2 > 8 > 7.3  -Iron panel: Iron 22, TIBC 203, Ferritin 303, Transferrin 145   Subjective:   Patient is a 49 yo female with PMH of HTN, DM2 with neuropathy, anemia, acute on chronic renal failure with Stage V CKD at baseline, hypercholesteremia, GERD, and MGUS who presented with diarrhea, chest pain and increased Cr to 6.4 after reporting running out of her anti-hypertensive medications. Patient is followed by nephrology in [place], with Dr. Theador Hawthorne.   Patient endorses current migraine with nausea, chest pain, back pain, constipation. Patient endorses tylenol is not alleviating her migraine. Patient reports decreased urine output from baseline and urinates 5-6x at home. Patient currently denies changes in vision or skin, pruritis, dyspnea, abdominal pain, or dysuria.    Objective:   BP 104/67 (BP Location: Right Arm)   Pulse 68   Temp 97.8 F (36.6 C) (Oral)   Resp 16   Ht 5\' 3"  (1.6 m)   Wt 65.8 kg   LMP 12/17/2016   SpO2 97%   BMI 25.69 kg/m   Intake/Output Summary (Last 24 hours) at 08/10/2019 1012 Last data filed at 08/10/2019 0341 Gross per 24 hour  Intake 960.9 ml  Output 400 ml  Net 560.9 ml    Filed Weights   08/08/19 1532  Weight: 65.8 kg    Physical Exam: Gen: AOx4, resting in bed in no acute distress CVS: RRR, no rubs or murmurs appreciated Resp: CTAB, no rales, rhonchi, or crackles noted, normal work of breathing  Abd: No  abdominal tenderness Ext: No peripheral edema, skin dry, no rashes noted  Imaging: CT Head Wo Contrast  Result Date: 08/08/2019 CLINICAL DATA:  Headache 2 days.  Hypertension.  Possible stroke. EXAM: CT HEAD WITHOUT CONTRAST TECHNIQUE: Contiguous axial images were obtained from the base of the skull through the vertex without intravenous contrast. COMPARISON:  None. FINDINGS: Brain: Ventricles, cisterns and other CSF spaces are normal. There is no mass, mass effect, shift of midline structures or acute hemorrhage. No evidence of acute infarction. Vascular: No hyperdense vessel or unexpected calcification. Skull: Normal. Negative for fracture or focal lesion. Sinuses/Orbits: No acute finding. Other: None. IMPRESSION: No acute findings. Electronically Signed   By: Marin Olp M.D.   On: 08/08/2019 17:31   CT LUMBAR SPINE WO CONTRAST  Result Date: 08/09/2019 CLINICAL DATA:  49 year old female with increasing low back pain for several days. Monoclonal gammopathy of unknown significance. Osteoporosis. EXAM: CT LUMBAR SPINE WITHOUT CONTRAST TECHNIQUE: Multidetector CT imaging of the lumbar spine was performed without intravenous contrast administration. Multiplanar CT image reconstructions were also generated. COMPARISON:  Radiographic skeletal survey 07/23/2019. CT Abdomen and Pelvis 11/03/2018. FINDINGS: Segmentation: Normal. Alignment: Stable from the skeletal survey earlier this month. Relatively maintained lumbar lordosis. No spondylolisthesis. Vertebrae: Background diffuse osteopenia. Visible lower thoracic levels appear intact. Moderate to severe L1 compression  fracture with anterior wedging stable from the radiographs earlier this month. Prominent gas within the fractured vertebral body and the adjacent L1-L2 disc. No significant retropulsion of bone. L1 posterior elements appear intact. Remaining lumbar levels appear intact. Visible sacrum and SI joints appear intact. Paraspinal and other soft  tissues: Chronic cholelithiasis with a hazy appearance of gallbladder appears similar to the 2020 CT Abdomen and Pelvis. Pleural fluid at the costophrenic angles is also redemonstrated. There is a small to moderate volume of free fluid in the visible pelvis, cul-de-sac with simple fluid density (series 4, image 144). Superimposed extensive abdominal and pelvic visceral artery calcified atherosclerosis. Disc levels: Mild for age lumbar spine degeneration. IMPRESSION: 1. L1 compression fracture is stable from the skeletal survey earlier this month. 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 And there is posttraumatic or degenerative gas in the adjacent L1-L2 disc. No significant retropulsion of bone or complicating features. 2. Osteopenia. No other acute osseous abnormality in the lumbar spine. 3. Nonspecific small volume of ascites in the visible pelvis. Pleural fluid visible in the costophrenic angles. 4. Chronic cholelithiasis. 5. Extensive calcified atherosclerosis. Electronically Signed   By: Genevie Ann M.D.   On: 08/09/2019 18:31   MR BRAIN WO CONTRAST  Result Date: 08/09/2019 CLINICAL DATA:  Initial evaluation for acute encephalopathy.  TIA. EXAM: MRI HEAD WITHOUT CONTRAST TECHNIQUE: Multiplanar, multiecho pulse sequences of the brain and surrounding structures were obtained without intravenous contrast. COMPARISON:  Prior CT from 08/08/2019. FINDINGS: Brain: Examination moderately degraded by motion artifact. Cerebral volume within normal limits for age. Patchy T2/FLAIR hyperintensity within the pons, most like related chronic microvascular ischemic disease, mild in nature. No abnormal foci of restricted diffusion to suggest acute or subacute ischemia. Gray-white matter differentiation maintained. No encephalomalacia to suggest chronic cortical infarction. No foci of susceptibility artifact to suggest acute or chronic intracranial hemorrhage. No mass lesion, midline  shift or mass effect. No hydrocephalus. No extra-axial fluid collection. Pituitary gland suprasellar region normal. Midline structures intact. Vascular: Major intracranial vascular flow voids are maintained. Skull and upper cervical spine: Craniocervical junction within normal limits. Upper cervical spine unremarkable. Bone marrow signal intensity within normal limits. No scalp soft tissue abnormality. Sinuses/Orbits: Patient status post bilateral ocular lens replacement. Globes and orbital soft tissues demonstrate no acute finding. Paranasal sinuses are largely clear. No mastoid effusion. Inner ear structures grossly normal. Other: None. IMPRESSION: 1. No acute intracranial abnormality. 2. Patchy T2/FLAIR hyperintensity involving the pons, most likely related to chronic small vessel ischemic disease, mild for age. Electronically Signed   By: Jeannine Boga M.D.   On: 08/09/2019 06:35   US RENAL  Result Date: 08/09/2019 CLINICAL DATA:  Acute kidney injury EXAM: RENAL / URINARY TRACT ULTRASOUND COMPLETE COMPARISON:  03/05/2019 FINDINGS: Right Kidney: Renal measurements: 10.6 x 5.1 x 5 cm = volume: 136 mL . Echogenicity within normal limits. No mass or hydronephrosis visualized. Left Kidney: Renal measurements: 10.9 x 5.5 x 5.6 = volume: 175 mL. Echogenicity within normal limits. No mass or hydronephrosis visualized. Bladder: Not visualized Other: None. IMPRESSION: 1. No acute abnormality.  No hydronephrosis. 2. The bladder was not visualized on this exam. Electronically Signed   By: Constance Holster M.D.   On: 08/09/2019 22:49   DG Chest Portable 1 View  Result Date: 08/08/2019 CLINICAL DATA:  Weakness EXAM: PORTABLE CHEST 1 VIEW COMPARISON:  02/28/2019 FINDINGS: There is a moderate right and small left pleural effusion. Bibasilar airspace disease is noted and favored to  represent atelectasis. There is no pneumothorax. The heart size is stable from prior study. There are few linear opacities in the  left lower lung zone favored to represent atelectasis or scarring. There is vascular congestion. IMPRESSION: 1. Persistent bilateral pleural effusions, right greater than left. 2. Bibasilar airspace disease favored to represent atelectasis with an infiltrate not entirely excluded. Electronically Signed   By: Constance Holster M.D.   On: 08/08/2019 16:42   ECHOCARDIOGRAM COMPLETE  Result Date: 08/09/2019   ECHOCARDIOGRAM REPORT   Patient Name:   Melanie Hall Date of Exam: 08/09/2019 Medical Rec #:  [id number]     Height:       63.0 in Accession #:    [id number]    Weight:       145.0 lb Date of Birth:  [birth year]     BSA:          1.69 m Patient Age:    49 years      BP:           115/70 mmHg Patient Gender: F             HR:           70 bpm. Exam Location:  Inpatient Procedure: 2D Echo Indications:    TIA 435.9  History:        Patient has no prior history of Echocardiogram examinations.                 Risk Factors:Hypertension and Diabetes.  Sonographer:    Jannett Celestine RDCS (AE) Referring Phys: [place]  1. Left ventricular ejection fraction, by visual estimation, is 65 to 70%. The left ventricle has hyperdynamic function. There is no left ventricular hypertrophy.  2. Left ventricular diastolic parameters are indeterminate.  3. The left ventricle has no regional wall motion abnormalities.  4. Global right ventricle has normal systolic function.The right ventricular size is normal. No increase in right ventricular wall thickness.  5. Left atrial size was mildly dilated.  6. Right atrial size was normal.  7. The mitral valve is normal in structure. Trivial mitral valve regurgitation. No evidence of mitral stenosis.  8. The tricuspid valve is normal in structure.  9. The tricuspid valve is normal in structure. Tricuspid valve regurgitation is not demonstrated. 10. The aortic valve has an indeterminant number of cusps. Aortic valve regurgitation is not visualized. No evidence of  aortic valve sclerosis or stenosis. 11. The pulmonic valve was not well visualized. Pulmonic valve regurgitation is not visualized. 12. The inferior vena cava is dilated in size with <50% respiratory variability, suggesting right atrial pressure of 15 mmHg. FINDINGS  Left Ventricle: Left ventricular ejection fraction, by visual estimation, is 65 to 70%. The left ventricle has hyperdynamic function. The left ventricle has no regional wall motion abnormalities. There is no left ventricular hypertrophy. Left ventricular diastolic parameters are indeterminate. Right Ventricle: The right ventricular size is normal. No increase in right ventricular wall thickness. Global RV systolic function is has normal systolic function. Left Atrium: Left atrial size was mildly dilated. Right Atrium: Right atrial size was normal in size Pericardium: There is no evidence of pericardial effusion. Mitral Valve: The mitral valve is normal in structure. Trivial mitral valve regurgitation. No evidence of mitral valve stenosis by observation. Tricuspid Valve: The tricuspid valve is normal in structure. Tricuspid valve regurgitation is not demonstrated. Aortic Valve: The aortic valve has an indeterminant number of cusps. Aortic valve regurgitation is not visualized.  The aortic valve is structurally normal, with no evidence of sclerosis or stenosis. Aortic valve mean gradient measures 3.2 mmHg. Aortic valve peak gradient measures 7.1 mmHg. Aortic valve area, by VTI measures 3.15 cm. Pulmonic Valve: The pulmonic valve was not well visualized. Pulmonic valve regurgitation is not visualized. Pulmonic regurgitation is not visualized. No evidence of pulmonic stenosis. Aorta: The aortic root is normal in size and structure. Venous: The inferior vena cava is dilated in size with less than 50% respiratory variability, suggesting right atrial pressure of 15 mmHg. IAS/Shunts: No atrial level shunt detected by color flow Doppler.  LEFT VENTRICLE PLAX 2D  LVIDd:         4.40 cm  Diastology LVIDs:         2.60 cm  LV e' lateral:   6.42 cm/s LV PW:         1.00 cm  LV E/e' lateral: 13.1 LV IVS:        0.70 cm  LV e' medial:    6.74 cm/s LVOT diam:     2.10 cm  LV E/e' medial:  12.5 LV SV:         63 ml LV SV Index:   36.60 LVOT Area:     3.46 cm  RIGHT VENTRICLE RV S prime:     10.90 cm/s TAPSE (M-mode): 2.1 cm LEFT ATRIUM             Index       RIGHT ATRIUM           Index LA diam:        3.50 cm 2.08 cm/m  RA Area:     15.40 cm LA Vol (A2C):   52.3 ml 31.01 ml/m RA Volume:   38.50 ml  22.83 ml/m LA Vol (A4C):   48.6 ml 28.81 ml/m LA Biplane Vol: 50.4 ml 29.88 ml/m  AORTIC VALVE AV Area (Vmax):    2.73 cm AV Area (Vmean):   3.16 cm AV Area (VTI):     3.15 cm AV Vmax:           133.02 cm/s AV Vmean:          82.486 cm/s AV VTI:            0.227 m AV Peak Grad:      7.1 mmHg AV Mean Grad:      3.2 mmHg LVOT Vmax:         105.00 cm/s LVOT Vmean:        75.300 cm/s LVOT VTI:          0.206 m LVOT/AV VTI ratio: 0.91  AORTA Ao Root diam: 2.70 cm MITRAL VALVE MV Area (PHT): 2.80 cm             SHUNTS MV PHT:        78.59 msec           Systemic VTI:  0.21 m MV Decel Time: 271 msec             Systemic Diam: 2.10 cm MV E velocity: 84.00 cm/s 103 cm/s MV A velocity: 58.70 cm/s 70.3 cm/s MV E/A ratio:  1.43       1.5  Carlyle Dolly MD Electronically signed by Carlyle Dolly MD Signature Date/Time: 08/09/2019/1:42:30 PM    Final     Labs: BMET Recent Labs  Lab 08/08/19 1607 08/09/19 0829 08/10/19 0635  NA 133* 136 136  K 4.2 4.0 4.5  CL 106 107  106  CO2 16* 15* 14*  GLUCOSE 263* 170* 103*  BUN 73* 73* 83*  CREATININE 6.40* 6.76* 7.00*  CALCIUM 8.1* 7.7* 7.7*   CBC Recent Labs  Lab 08/08/19 1607 08/09/19 0829 08/09/19 2321 08/10/19 0635  WBC 11.7* 8.0  --  8.1  NEUTROABS 8.8*  --   --   --   HGB 7.5* 6.2* 8.0* 7.3*  HCT 22.9* 18.4* 23.3* 22.3*  MCV 94.6 92.0  --  91.8  PLT 193 138*  --  182    Medications:     amLODipine  5 mg  Oral Daily   aspirin  300 mg Rectal Daily   Or   aspirin  325 mg Oral Daily   carvedilol  6.25 mg Oral BID WC   gabapentin  100 mg Oral BID   [START ON 08/11/2019] heparin  5,000 Units Subcutaneous Q8H   insulin aspart  0-9 Units Subcutaneous TID WC   insulin aspart protamine- aspart  15 Units Subcutaneous TID AC   levothyroxine  25 mcg Oral Daily   pantoprazole  40 mg Oral QHS   pravastatin  40 mg Oral q1800   sodium bicarbonate  650 mg Oral BID      Harrell Gave, PA-Student   08/10/2019, 10:12 AM

## 2019-08-10 NOTE — Progress Notes (Signed)
PT Cancellation Note  Patient Details Name: Melanie Hall MRN: [id number] DOB: 06-Sep-1970   Cancelled Treatment:    Reason Eval/Treat Not Completed: Other (comment). Pt was up earlier with OT and has since had a headache and deferred working with PT. RN reports pt to have had a kidney function decline and isn't medically doing all that great. Acute PT to return as able to progress mobility.   Kittie Plater, PT, DPT Acute Rehabilitation Services Pager #: [phone] Office #: [phone]   Berline Lopes 08/10/2019, 12:41 PM

## 2019-08-10 NOTE — Progress Notes (Signed)
[facility] Progress Note    Assessment/ Plan:   1. Renal failure - w/ nephrotic range proteinuria (11gm nov 2020), hx of IDDM, but progressing rapidly over last 12 mos.  Had +ANA , other serologies negative. Reorder ANA w/ titers. SPEP slightly + in Nov, then repeat 2 wks ago was negative. Admitted for rising creat 6.7 w/ N/V. Alb 2.5.  No indication for HD just yet, will closely follow. Will be getting renal biopsy tomorrow per IR, appreciate assistance.     2. Anemia - Hb 6.2, sig issue, per hem-onc. Getting prbc x 1  3. HTN/ vol - mild LE edema and small pleural effusions, no need for lasix at this time. No resp issues.   Subjective:    For kidney biopsy tomorrow.  Sleeping, regards me, no complaints.     Objective:   BP 119/71 (BP Location: Right Arm)   Pulse 63   Temp (!) 97.5 F (36.4 C) (Oral)   Resp 16   Ht 5\' 3"  (1.6 m)   Wt 65.8 kg   LMP 12/17/2016   SpO2 98%   BMI 25.69 kg/m   Intake/Output Summary (Last 24 hours) at 08/10/2019 1538 Last data filed at 08/10/2019 0341 Gross per 24 hour  Intake 720.9 ml  Output 400 ml  Net 320.9 ml   Weight change:   Physical Exam: Gen:NAD, lying in bed CVS: RRR Resp: clear bilaterally Abd: nontender Ext: no LE edema  Imaging: CT Head Wo Contrast  Result Date: 08/08/2019 CLINICAL DATA:  Headache 2 days.  Hypertension.  Possible stroke. EXAM: CT HEAD WITHOUT CONTRAST TECHNIQUE: Contiguous axial images were obtained from the base of the skull through the vertex without intravenous contrast. COMPARISON:  None. FINDINGS: Brain: Ventricles, cisterns and other CSF spaces are normal. There is no mass, mass effect, shift of midline structures or acute hemorrhage. No evidence of acute infarction. Vascular: No hyperdense vessel or unexpected calcification. Skull: Normal. Negative for fracture or focal lesion. Sinuses/Orbits: No acute finding. Other: None. IMPRESSION: No acute findings. Electronically Signed   By: Marin Olp M.D.   On: 08/08/2019 17:31   CT LUMBAR SPINE WO CONTRAST  Result Date: 08/09/2019 CLINICAL DATA:  49 year old female with increasing low back pain for several days. Monoclonal gammopathy of unknown significance. Osteoporosis. EXAM: CT LUMBAR SPINE WITHOUT CONTRAST TECHNIQUE: Multidetector CT imaging of the lumbar spine was performed without intravenous contrast administration. Multiplanar CT image reconstructions were also generated. COMPARISON:  Radiographic skeletal survey 07/23/2019. CT Abdomen and Pelvis 11/03/2018. FINDINGS: Segmentation: Normal. Alignment: Stable from the skeletal survey earlier this month. Relatively maintained lumbar lordosis. No spondylolisthesis. Vertebrae: Background diffuse osteopenia. Visible lower thoracic levels appear intact. Moderate to severe L1 compression fracture with anterior wedging stable from the radiographs earlier this month. Prominent gas within the fractured vertebral body and the adjacent L1-L2 disc. No significant retropulsion of bone. L1 posterior elements appear intact. Remaining lumbar levels appear intact. Visible sacrum and SI joints appear intact. Paraspinal and other soft tissues: Chronic cholelithiasis with a hazy appearance of gallbladder appears similar to the 2020 CT Abdomen and Pelvis. Pleural fluid at the costophrenic angles is also redemonstrated. There is a small to moderate volume of free fluid in the visible pelvis, cul-de-sac with simple fluid density (series 4, image 144). Superimposed extensive abdominal and pelvic visceral artery calcified atherosclerosis. Disc levels: Mild for age lumbar spine degeneration. IMPRESSION: 1. L1 compression fracture is stable from the skeletal survey earlier this month. Gas within the fractured  vertebral body indicates osteonecrosis (Kmmell disease). And there is posttraumatic or degenerative gas in the adjacent L1-L2 disc. No significant retropulsion of bone or complicating features. 2. Osteopenia. No  other acute osseous abnormality in the lumbar spine. 3. Nonspecific small volume of ascites in the visible pelvis. Pleural fluid visible in the costophrenic angles. 4. Chronic cholelithiasis. 5. Extensive calcified atherosclerosis. Electronically Signed   By: Genevie Ann M.D.   On: 08/09/2019 18:31   MR BRAIN WO CONTRAST  Result Date: 08/09/2019 CLINICAL DATA:  Initial evaluation for acute encephalopathy.  TIA. EXAM: MRI HEAD WITHOUT CONTRAST TECHNIQUE: Multiplanar, multiecho pulse sequences of the brain and surrounding structures were obtained without intravenous contrast. COMPARISON:  Prior CT from 08/08/2019. FINDINGS: Brain: Examination moderately degraded by motion artifact. Cerebral volume within normal limits for age. Patchy T2/FLAIR hyperintensity within the pons, most like related chronic microvascular ischemic disease, mild in nature. No abnormal foci of restricted diffusion to suggest acute or subacute ischemia. Gray-white matter differentiation maintained. No encephalomalacia to suggest chronic cortical infarction. No foci of susceptibility artifact to suggest acute or chronic intracranial hemorrhage. No mass lesion, midline shift or mass effect. No hydrocephalus. No extra-axial fluid collection. Pituitary gland suprasellar region normal. Midline structures intact. Vascular: Major intracranial vascular flow voids are maintained. Skull and upper cervical spine: Craniocervical junction within normal limits. Upper cervical spine unremarkable. Bone marrow signal intensity within normal limits. No scalp soft tissue abnormality. Sinuses/Orbits: Patient status post bilateral ocular lens replacement. Globes and orbital soft tissues demonstrate no acute finding. Paranasal sinuses are largely clear. No mastoid effusion. Inner ear structures grossly normal. Other: None. IMPRESSION: 1. No acute intracranial abnormality. 2. Patchy T2/FLAIR hyperintensity involving the pons, most likely related to chronic small vessel  ischemic disease, mild for age. Electronically Signed   By: Jeannine Boga M.D.   On: 08/09/2019 06:35   US RENAL  Result Date: 08/09/2019 CLINICAL DATA:  Acute kidney injury EXAM: RENAL / URINARY TRACT ULTRASOUND COMPLETE COMPARISON:  03/05/2019 FINDINGS: Right Kidney: Renal measurements: 10.6 x 5.1 x 5 cm = volume: 136 mL . Echogenicity within normal limits. No mass or hydronephrosis visualized. Left Kidney: Renal measurements: 10.9 x 5.5 x 5.6 = volume: 175 mL. Echogenicity within normal limits. No mass or hydronephrosis visualized. Bladder: Not visualized Other: None. IMPRESSION: 1. No acute abnormality.  No hydronephrosis. 2. The bladder was not visualized on this exam. Electronically Signed   By: Constance Holster M.D.   On: 08/09/2019 22:49   DG Chest Portable 1 View  Result Date: 08/08/2019 CLINICAL DATA:  Weakness EXAM: PORTABLE CHEST 1 VIEW COMPARISON:  02/28/2019 FINDINGS: There is a moderate right and small left pleural effusion. Bibasilar airspace disease is noted and favored to represent atelectasis. There is no pneumothorax. The heart size is stable from prior study. There are few linear opacities in the left lower lung zone favored to represent atelectasis or scarring. There is vascular congestion. IMPRESSION: 1. Persistent bilateral pleural effusions, right greater than left. 2. Bibasilar airspace disease favored to represent atelectasis with an infiltrate not entirely excluded. Electronically Signed   By: Constance Holster M.D.   On: 08/08/2019 16:42   ECHOCARDIOGRAM COMPLETE  Result Date: 08/09/2019   ECHOCARDIOGRAM REPORT   Patient Name:   LACHAE HOHLER Date of Exam: 08/09/2019 Medical Rec #:  [id number]     Height:       63.0 in Accession #:    [id number]    Weight:       145.0 lb  Date of Birth:  1970-12-05     BSA:          1.69 m Patient Age:    49 years      BP:           115/70 mmHg Patient Gender: F             HR:           70 bpm. Exam Location:  Inpatient Procedure: 2D  Echo Indications:    TIA 435.9  History:        Patient has no prior history of Echocardiogram examinations.                 Risk Factors:Hypertension and Diabetes.  Sonographer:    Jannett Celestine RDCS (AE) Referring Phys: [place]  1. Left ventricular ejection fraction, by visual estimation, is 65 to 70%. The left ventricle has hyperdynamic function. There is no left ventricular hypertrophy.  2. Left ventricular diastolic parameters are indeterminate.  3. The left ventricle has no regional wall motion abnormalities.  4. Global right ventricle has normal systolic function.The right ventricular size is normal. No increase in right ventricular wall thickness.  5. Left atrial size was mildly dilated.  6. Right atrial size was normal.  7. The mitral valve is normal in structure. Trivial mitral valve regurgitation. No evidence of mitral stenosis.  8. The tricuspid valve is normal in structure.  9. The tricuspid valve is normal in structure. Tricuspid valve regurgitation is not demonstrated. 10. The aortic valve has an indeterminant number of cusps. Aortic valve regurgitation is not visualized. No evidence of aortic valve sclerosis or stenosis. 11. The pulmonic valve was not well visualized. Pulmonic valve regurgitation is not visualized. 12. The inferior vena cava is dilated in size with <50% respiratory variability, suggesting right atrial pressure of 15 mmHg. FINDINGS  Left Ventricle: Left ventricular ejection fraction, by visual estimation, is 65 to 70%. The left ventricle has hyperdynamic function. The left ventricle has no regional wall motion abnormalities. There is no left ventricular hypertrophy. Left ventricular diastolic parameters are indeterminate. Right Ventricle: The right ventricular size is normal. No increase in right ventricular wall thickness. Global RV systolic function is has normal systolic function. Left Atrium: Left atrial size was mildly dilated. Right Atrium: Right  atrial size was normal in size Pericardium: There is no evidence of pericardial effusion. Mitral Valve: The mitral valve is normal in structure. Trivial mitral valve regurgitation. No evidence of mitral valve stenosis by observation. Tricuspid Valve: The tricuspid valve is normal in structure. Tricuspid valve regurgitation is not demonstrated. Aortic Valve: The aortic valve has an indeterminant number of cusps. Aortic valve regurgitation is not visualized. The aortic valve is structurally normal, with no evidence of sclerosis or stenosis. Aortic valve mean gradient measures 3.2 mmHg. Aortic valve peak gradient measures 7.1 mmHg. Aortic valve area, by VTI measures 3.15 cm. Pulmonic Valve: The pulmonic valve was not well visualized. Pulmonic valve regurgitation is not visualized. Pulmonic regurgitation is not visualized. No evidence of pulmonic stenosis. Aorta: The aortic root is normal in size and structure. Venous: The inferior vena cava is dilated in size with less than 50% respiratory variability, suggesting right atrial pressure of 15 mmHg. IAS/Shunts: No atrial level shunt detected by color flow Doppler.  LEFT VENTRICLE PLAX 2D LVIDd:         4.40 cm  Diastology LVIDs:         2.60 cm  LV e'  lateral:   6.42 cm/s LV PW:         1.00 cm  LV E/e' lateral: 13.1 LV IVS:        0.70 cm  LV e' medial:    6.74 cm/s LVOT diam:     2.10 cm  LV E/e' medial:  12.5 LV SV:         63 ml LV SV Index:   36.60 LVOT Area:     3.46 cm  RIGHT VENTRICLE RV S prime:     10.90 cm/s TAPSE (M-mode): 2.1 cm LEFT ATRIUM             Index       RIGHT ATRIUM           Index LA diam:        3.50 cm 2.08 cm/m  RA Area:     15.40 cm LA Vol (A2C):   52.3 ml 31.01 ml/m RA Volume:   38.50 ml  22.83 ml/m LA Vol (A4C):   48.6 ml 28.81 ml/m LA Biplane Vol: 50.4 ml 29.88 ml/m  AORTIC VALVE AV Area (Vmax):    2.73 cm AV Area (Vmean):   3.16 cm AV Area (VTI):     3.15 cm AV Vmax:           133.02 cm/s AV Vmean:          82.486 cm/s AV VTI:             0.227 m AV Peak Grad:      7.1 mmHg AV Mean Grad:      3.2 mmHg LVOT Vmax:         105.00 cm/s LVOT Vmean:        75.300 cm/s LVOT VTI:          0.206 m LVOT/AV VTI ratio: 0.91  AORTA Ao Root diam: 2.70 cm MITRAL VALVE MV Area (PHT): 2.80 cm             SHUNTS MV PHT:        78.59 msec           Systemic VTI:  0.21 m MV Decel Time: 271 msec             Systemic Diam: 2.10 cm MV E velocity: 84.00 cm/s 103 cm/s MV A velocity: 58.70 cm/s 70.3 cm/s MV E/A ratio:  1.43       1.5  Carlyle Dolly MD Electronically signed by Carlyle Dolly MD Signature Date/Time: 08/09/2019/1:42:30 PM    Final     Labs: BMET Recent Labs  Lab 08/08/19 1607 08/09/19 0829 08/10/19 0635  NA 133* 136 136  K 4.2 4.0 4.5  CL 106 107 106  CO2 16* 15* 14*  GLUCOSE 263* 170* 103*  BUN 73* 73* 83*  CREATININE 6.40* 6.76* 7.00*  CALCIUM 8.1* 7.7* 7.7*   CBC Recent Labs  Lab 08/08/19 1607 08/09/19 0829 08/09/19 2321 08/10/19 0635  WBC 11.7* 8.0  --  8.1  NEUTROABS 8.8*  --   --   --   HGB 7.5* 6.2* 8.0* 7.3*  HCT 22.9* 18.4* 23.3* 22.3*  MCV 94.6 92.0  --  91.8  PLT 193 138*  --  182    Medications:    . amLODipine  5 mg Oral Daily  . carvedilol  6.25 mg Oral BID WC  . gabapentin  100 mg Oral BID  . [START ON 08/11/2019] heparin  5,000 Units Subcutaneous Q8H  . insulin  aspart  0-9 Units Subcutaneous TID WC  . insulin aspart protamine- aspart  15 Units Subcutaneous TID AC  . levothyroxine  25 mcg Oral Daily  . pantoprazole  40 mg Oral QHS  . pravastatin  40 mg Oral q1800  . sodium bicarbonate  650 mg Oral BID      Madelon Lips MD 08/10/2019, 3:38 PM

## 2019-08-10 NOTE — Evaluation (Signed)
Occupational Therapy Evaluation Patient Details Name: Melanie Hall MRN: [id number] DOB: 11/08/1970 Today's Date: 08/10/2019    History of Present Illness 49 y.o. female with medical history significant of chronic kidney disease stage V, hypertension, hyperlipidemia, diabetes mellitus type 2 who presented to the ER with intermittent chest pain, uncontrolled hypertension. She complains of a gradual onset diffuse headache and R arm weakness. Pt also with hx of monoclonal gammopathy.   Clinical Impression   This 49 yo female admitted with above presents to acute OT with c/o increased SOB with movement (not audible at EOB from supine, sats 95-97 on RA and HR 65-68), headache making her feel nauseaous, and generalized weakness all affecting her safety and independence with basic ADLs as she reports pta it depended on how she felt if she needed A for any of her basic ADLs. She will benefit from acute OT with follow up [place] and A prn.    Follow Up Recommendations  Home health OT;Supervision/Assistance - 24 hour    Equipment Recommendations  None recommended by OT       Precautions / Restrictions Precautions Precautions: Fall Restrictions Weight Bearing Restrictions: No      Mobility Bed Mobility Overal bed mobility: Modified Independent             General bed mobility comments: increased time, use of bed rail     Balance Overall balance assessment: Needs assistance Sitting-balance support: No upper extremity supported;Feet supported Sitting balance-Leahy Scale: Good Sitting balance - Comments: sitting EOB to don sock                                   ADL either performed or assessed with clinical judgement   ADL Overall ADL's : Needs assistance/impaired Eating/Feeding: Independent;Sitting Eating/Feeding Details (indicate cue type and reason): EOB Grooming: Set up;Sitting Grooming Details (indicate cue type and reason): EOB Upper Body Bathing: Set  up;Sitting Upper Body Bathing Details (indicate cue type and reason): EOB Lower Body Bathing: Minimal assistance;Sit to/from stand   Upper Body Dressing : Set up;Sitting Upper Body Dressing Details (indicate cue type and reason): EOB Lower Body Dressing: Minimal assistance;Sit to/from stand   Toilet Transfer: Minimal assistance;RW   Toileting- Clothing Manipulation and Hygiene: Minimal assistance;Sit to/from stand               Vision Baseline Vision/History: No visual deficits Patient Visual Report: No change from baseline              Pertinent Vitals/Pain Pain Assessment: 0-10 Faces Pain Scale: Hurts whole lot Pain Location: headache (Migraine) Pain Descriptors / Indicators: Headache Pain Intervention(s): Limited activity within patient's tolerance;Monitored during session;Repositioned     Hand Dominance Right   Extremity/Trunk Assessment Upper Extremity Assessment Upper Extremity Assessment: Overall WFL for tasks assessed(pt reports her RUE still feels weak, but with testing compared to LUE the same other than she did state pain in shouder and elbow with manual muscle testing)           Communication Communication Communication: No difficulties   Cognition Arousal/Alertness: Awake/alert Behavior During Therapy: WFL for tasks assessed/performed Overall Cognitive Status: Within Functional Limits for tasks assessed  Home Living Family/patient expects to be discharged to:: Private residence Living Arrangements: Non-relatives/Friends;Children(roommate) Available Help at Discharge: Family;Available PRN/intermittently(she reports most of the time someone is with her) Type of Home: House Home Access: Stairs to enter [employer] of Steps: 1 threshold Entrance Stairs-Rails: None Home Layout: One level     Bathroom Shower/Tub: Teacher, early years/pre: Standard     Home  Equipment: Museum/gallery conservator - 2 wheels;Tub bench          Prior Functioning/Environment Level of Independence: Independent with assistive device(s)        Comments: pt reports returning to community ambulation with use of RW        OT Problem List: Decreased strength;Impaired balance (sitting and/or standing);Decreased activity tolerance;Cardiopulmonary status limiting activity      OT Treatment/Interventions: Self-care/ADL training;Energy conservation;DME and/or AE instruction;Patient/family education;Balance training    OT Goals(Current goals can be found in the care plan section) Acute Rehab OT Goals Patient Stated Goal: to go home OT Goal Formulation: With patient Time For Goal Achievement: 08/24/19 Potential to Achieve Goals: Good  OT Frequency: Min 2X/week              AM-PAC OT "6 Clicks" Daily Activity     Outcome Measure Help from another person eating meals?: None Help from another person taking care of personal grooming?: A Little Help from another person toileting, which includes using toliet, bedpan, or urinal?: A Little Help from another person bathing (including washing, rinsing, drying)?: A Little Help from another person to put on and taking off regular upper body clothing?: A Little Help from another person to put on and taking off regular lower body clothing?: A Little 6 Click Score: 19   End of Session Nurse Communication: (pt c/o headache, nausea, SOB with movement)  Activity Tolerance: Other (comment)(limited by headache and feeling SOB with movement) Patient left: in bed;with call bell/phone within reach;with bed alarm set;with nursing/sitter in room  OT Visit Diagnosis: Unsteadiness on feet (R26.81);Other abnormalities of gait and mobility (R26.89);Muscle weakness (generalized) (M62.81)                Time: 7619-5093 OT Time Calculation (min): 19 min Charges:  OT General Charges $OT Visit: 1 Visit OT Evaluation $OT Eval Moderate  Complexity: 1 Mod  Cathy , OTR/L Acute [employer] Pager [phone] Office [phone]     08/10/2019, 10:08 AM

## 2019-08-11 ENCOUNTER — Inpatient Hospital Stay (HOSPITAL_COMMUNITY): Payer: Medicaid Other

## 2019-08-11 ENCOUNTER — Encounter: Payer: Self-pay | Admitting: Vascular Surgery

## 2019-08-11 ENCOUNTER — Inpatient Hospital Stay (HOSPITAL_COMMUNITY): Admission: RE | Admit: 2019-08-11 | Payer: Self-pay | Source: Ambulatory Visit

## 2019-08-11 ENCOUNTER — Other Ambulatory Visit: Payer: Self-pay | Admitting: Radiology

## 2019-08-11 ENCOUNTER — Other Ambulatory Visit (HOSPITAL_COMMUNITY): Payer: Self-pay

## 2019-08-11 HISTORY — PX: IR US GUIDE VASC ACCESS RIGHT: IMG2390

## 2019-08-11 HISTORY — PX: IR FLUORO GUIDE CV LINE RIGHT: IMG2283

## 2019-08-11 LAB — ANTINUCLEAR ANTIBODIES, IFA: ANA Ab, IFA: NEGATIVE

## 2019-08-11 LAB — BASIC METABOLIC PANEL
Anion gap: 15 (ref 5–15)
BUN: 96 mg/dL — ABNORMAL HIGH (ref 6–20)
CO2: 12 mmol/L — ABNORMAL LOW (ref 22–32)
Calcium: 7.3 mg/dL — ABNORMAL LOW (ref 8.9–10.3)
Chloride: 106 mmol/L (ref 98–111)
Creatinine, Ser: 7.54 mg/dL — ABNORMAL HIGH (ref 0.44–1.00)
GFR calc Af Amer: 7 mL/min — ABNORMAL LOW (ref >60)
GFR calc non Af Amer: 6 mL/min — ABNORMAL LOW (ref >60)
Glucose, Bld: 158 mg/dL — ABNORMAL HIGH (ref 70–99)
Potassium: 4.2 mmol/L (ref 3.5–5.1)
Sodium: 133 mmol/L — ABNORMAL LOW (ref 135–145)

## 2019-08-11 LAB — CBC
HCT: 23.3 % — ABNORMAL LOW (ref 36.0–46.0)
Hemoglobin: 7.7 g/dL — ABNORMAL LOW (ref 12.0–15.0)
MCH: 30.1 pg (ref 26.0–34.0)
MCHC: 33 g/dL (ref 30.0–36.0)
MCV: 91 fL (ref 80.0–100.0)
Platelets: 174 10*3/uL (ref 150–400)
RBC: 2.56 MIL/uL — ABNORMAL LOW (ref 3.87–5.11)
RDW: 15.7 % — ABNORMAL HIGH (ref 11.5–15.5)
WBC: 7.1 10*3/uL (ref 4.0–10.5)
nRBC: 0 % (ref 0.0–0.2)

## 2019-08-11 LAB — GLUCOSE, CAPILLARY
Glucose-Capillary: 134 mg/dL — ABNORMAL HIGH (ref 70–99)
Glucose-Capillary: 132 mg/dL — ABNORMAL HIGH (ref 70–99)
Glucose-Capillary: 171 mg/dL — ABNORMAL HIGH (ref 70–99)
Glucose-Capillary: 177 mg/dL — ABNORMAL HIGH (ref 70–99)

## 2019-08-11 LAB — PROTIME-INR
INR: 1.2 (ref 0.8–1.2)
Prothrombin Time: 15.2 seconds (ref 11.4–15.2)

## 2019-08-11 LAB — FOLATE RBC
Folate, Hemolysate: 265 ng/mL
Folate, RBC: 1440 ng/mL (ref >498)
Hematocrit: 18.4 % — ABNORMAL LOW (ref 34.0–46.6)

## 2019-08-11 IMAGING — XA IR FLUORO GUIDE CV LINE*R*
2 series · 4 of 4 positions shown · non-contrast
Comparison: none

INDICATION: 48-year-old with acute on chronic renal failure. Patient needs
hemodialysis.

[Series 1: ir (id) (id)/(id) · 3 of 3 slices shown]
[im 1/3]
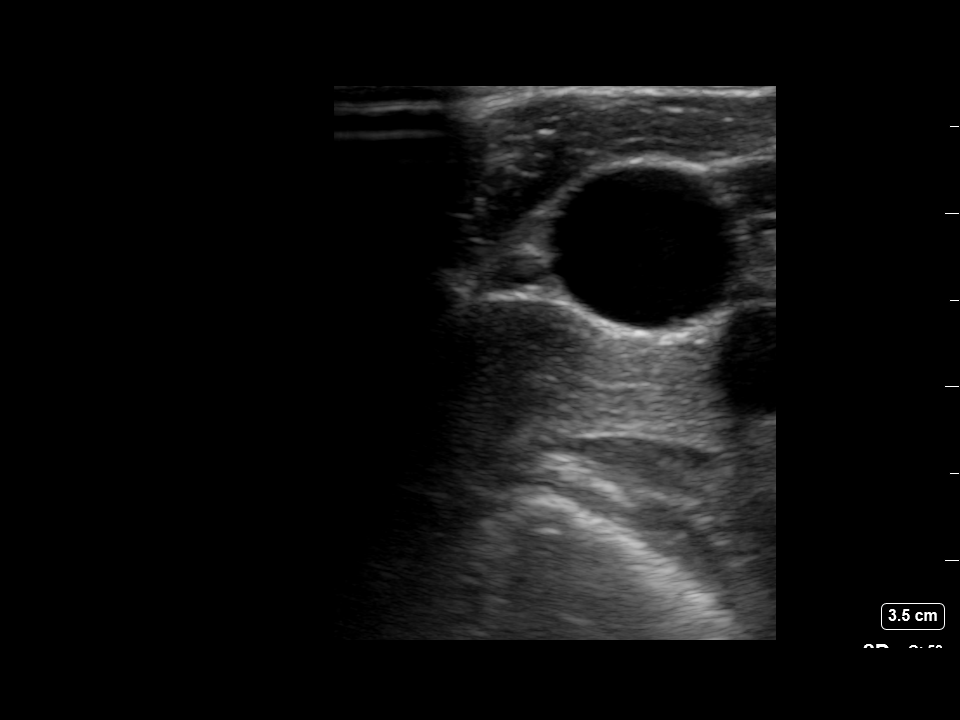
[im 2/3]
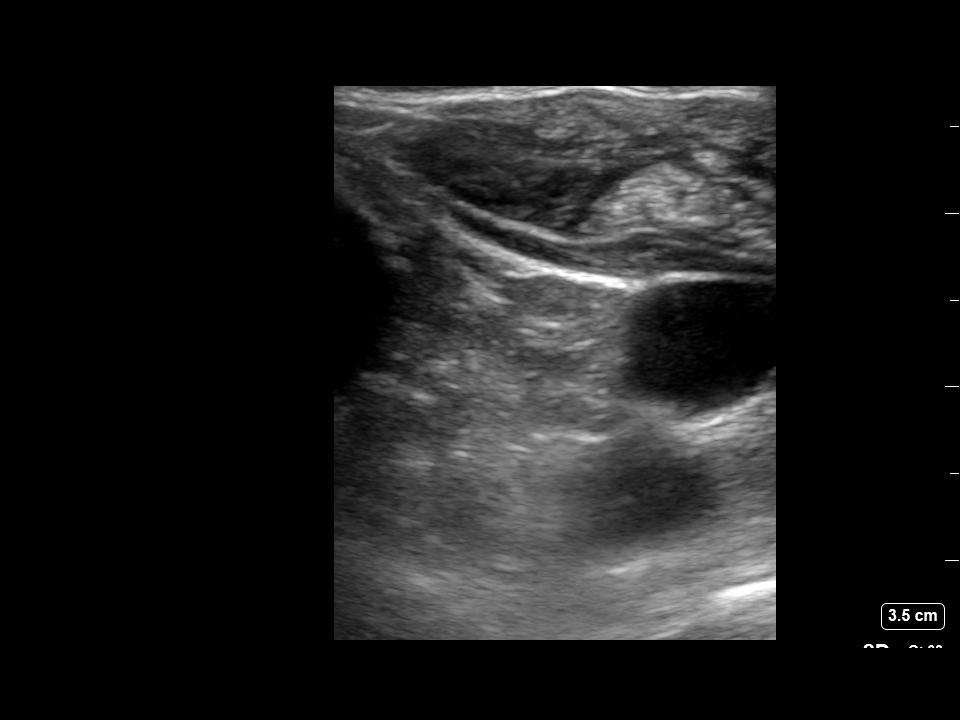
[im 3/3]
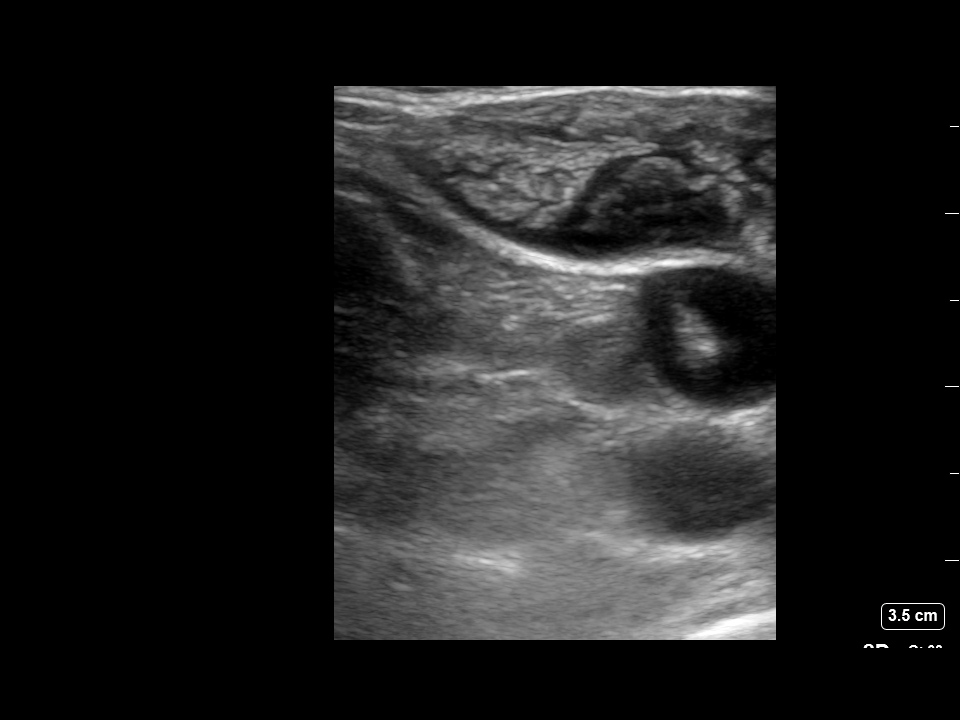

[Series 300: dsa body · 1 of 1 slices shown]
[im 1/1]
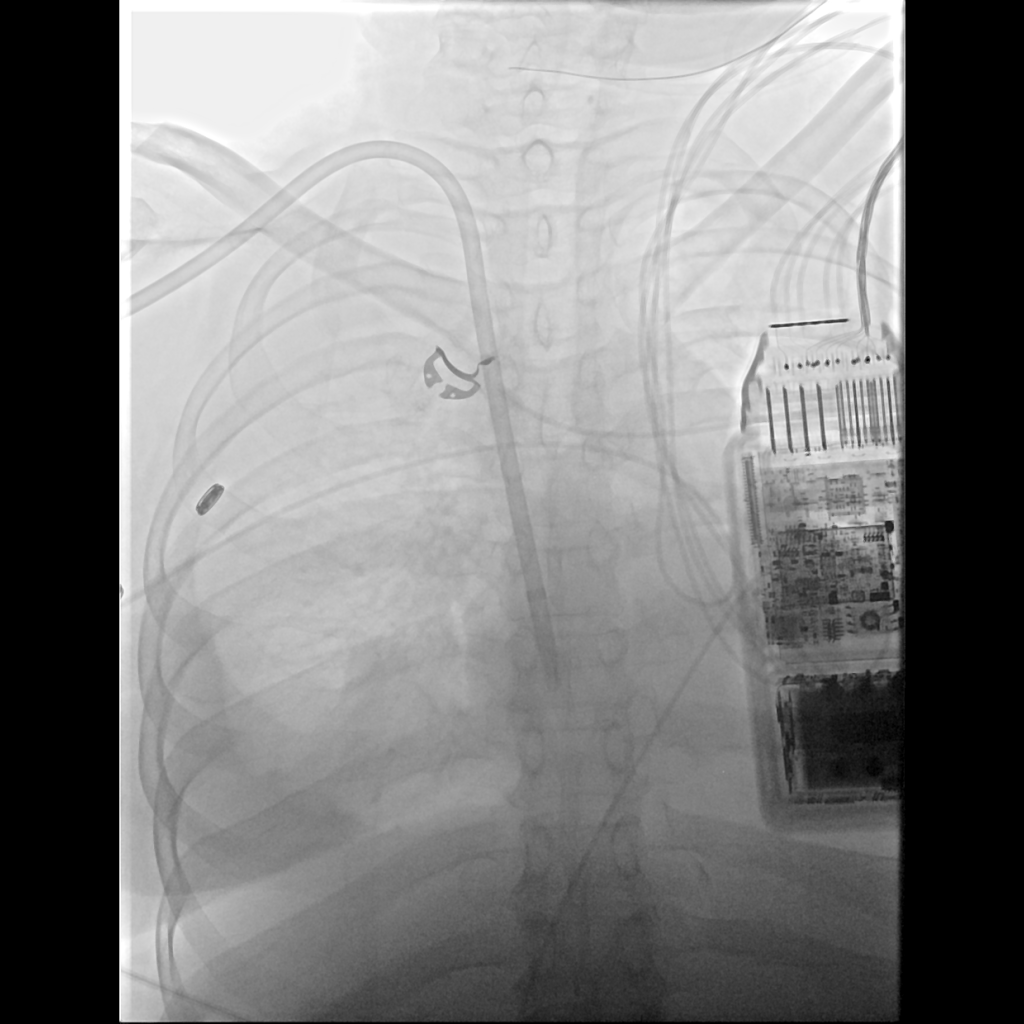

[4 of 4 positions shown; findings below may reference images not displayed]

EXAM:
FLUOROSCOPIC AND ULTRASOUND GUIDED PLACEMENT OF A TUNNELED DIALYSIS
CATHETER

MEDICATIONS:
Ancef 2 g; The antibiotic was administered within an appropriate
time interval prior to skin puncture.

ANESTHESIA/SEDATION:
Versed 1.0 mg IV; Fentanyl 50 mcg IV;

Moderate Sedation Time:  27 minutes

The patient was continuously monitored during the procedure by the
interventional radiology nurse under my direct supervision.

FLUOROSCOPY TIME:  Fluoroscopy Time: 24 seconds, 1 mGy

COMPLICATIONS:
None immediate.

PROCEDURE:
The procedure was explained to the patient. The risks and benefits
of the procedure were discussed and the patient's questions were
addressed. Informed consent was obtained from the patient. The
patient was placed supine on the interventional table. Ultrasound
confirmed a patent right internal jugular vein. Ultrasound images
were obtained for documentation. The right neck and chest was
prepped and draped in a sterile fashion. The right neck was
anesthetized with 1% lidocaine. Maximal barrier sterile technique
was utilized including caps, mask, sterile gowns, sterile gloves,
sterile drape, hand hygiene and skin antiseptic. A small incision
was made with #11 blade scalpel. A 21 gauge needle directed into the
right internal jugular vein with ultrasound guidance. A
micropuncture dilator set was placed. A 19 cm tip to cuff Palindrome
catheter was selected. The skin below the right clavicle was
anesthetized and a small incision was made with an #11 blade
scalpel. A subcutaneous tunnel was formed to the vein dermatotomy
site. The catheter was brought through the tunnel. The vein
dermatotomy site was dilated to accommodate a peel-away sheath. The
catheter was placed through the peel-away sheath and directed into
the central venous structures. The tip of the catheter was placed at
the SVC and right atrium junction with fluoroscopy. Fluoroscopic
images were obtained for documentation. Both lumens were found to
aspirate and flush well. The proper amount of heparin was flushed in
both lumens. The vein dermatotomy site was closed using a single
layer of absorbable suture and Dermabond. Gel-Foam placed in the
subcutaneous tract. The catheter was secured to the skin using
Prolene suture.
IMPRESSION: Successful placement of a right jugular tunneled dialysis catheter
using ultrasound and fluoroscopic guidance.

## 2019-08-11 IMAGING — DX DG ABDOMEN 1V
1 series · 1 of 1 positions shown · non-contrast
Comparison: Abdominal CT [DATE]

CLINICAL DATA: Acute abdominal pain

EXAM:
ABDOMEN - 1 VIEW

[abdomen kub]
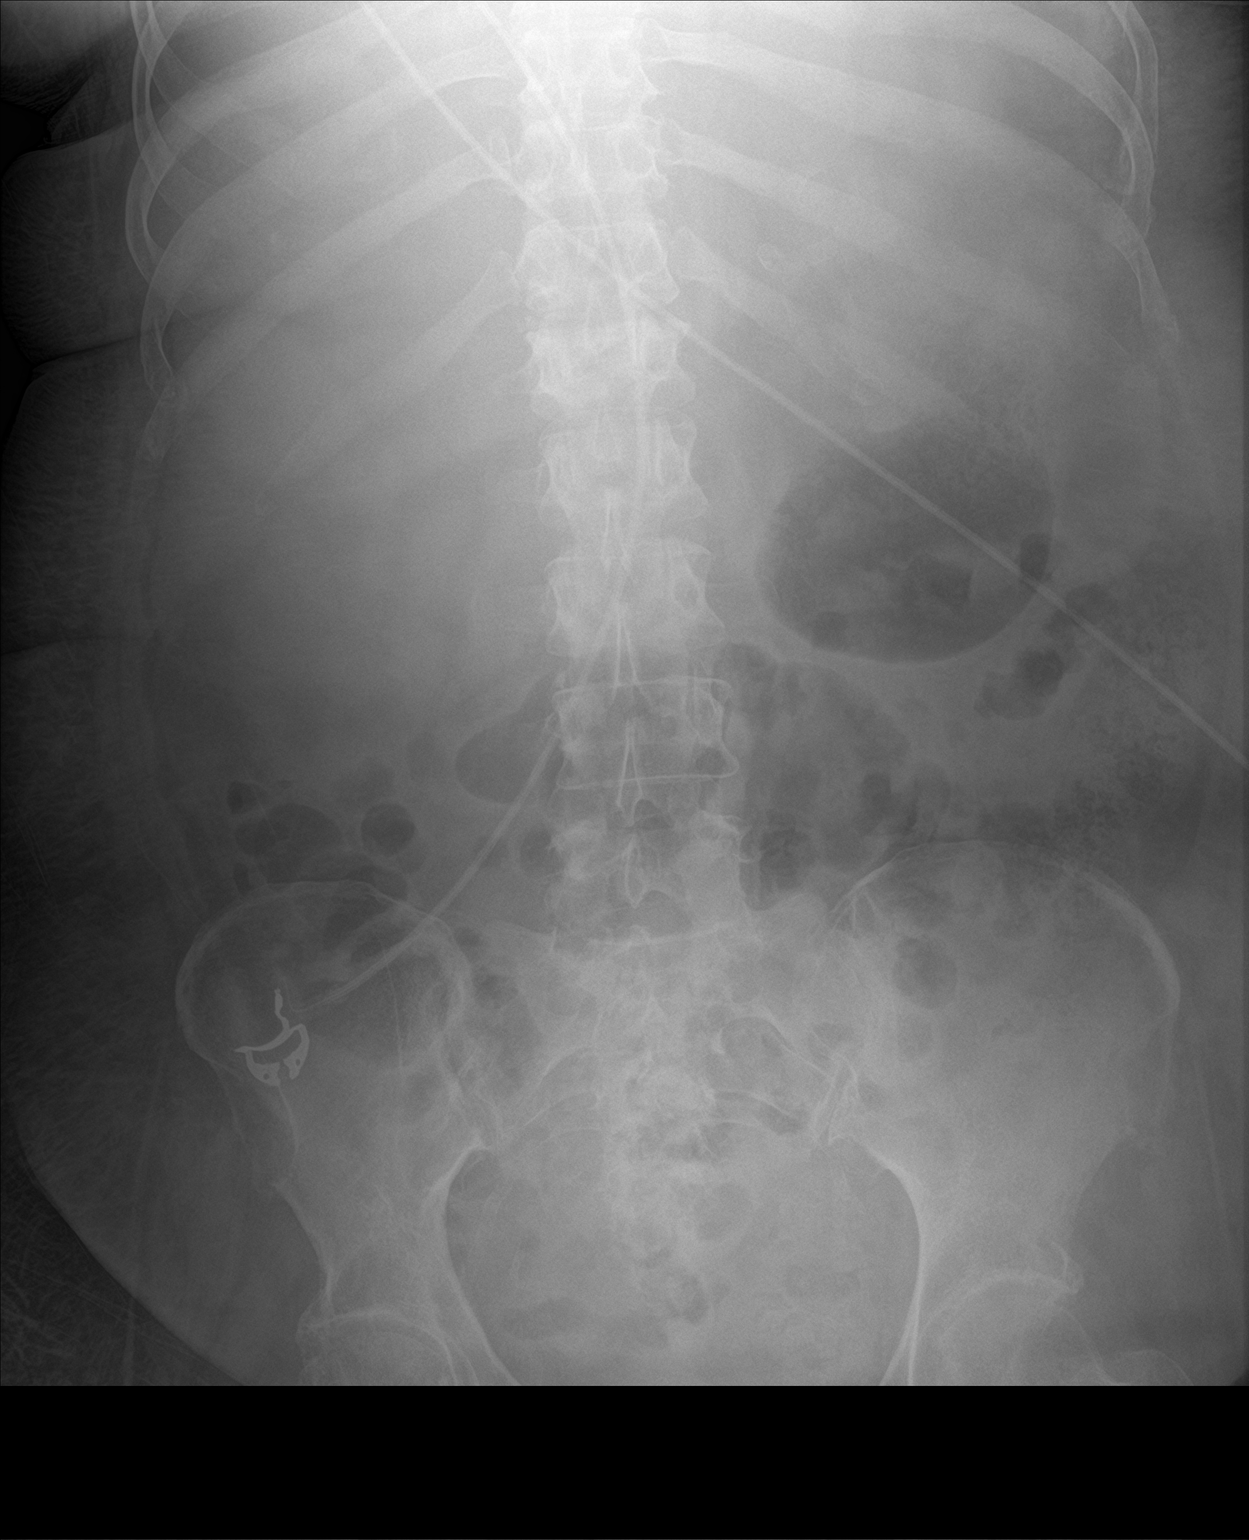

[1 of 1 positions shown; findings below may reference images not displayed]

FINDINGS: Normal bowel gas pattern. No concerning mass effect or gas
collection. Splenic atherosclerotic calcification.
IMPRESSION: Normal bowel gas pattern.

## 2019-08-11 MED ORDER — SODIUM BICARBONATE 650 MG PO TABS
1300.0000 mg | ORAL_TABLET | Freq: Three times a day (TID) | ORAL | Status: DC
  Administered 2019-08-11 – 2019-08-14 (×10): 1300 mg via ORAL
  Filled 2019-08-11 (×10): qty 2

## 2019-08-11 MED ORDER — CEFAZOLIN (ANCEF) 1 G IV SOLR
INTRAVENOUS | Status: AC | PRN
  Administered 2019-08-11: 2 g

## 2019-08-11 MED ORDER — LIDOCAINE HCL (PF) 1 % IJ SOLN: 5.0000 mL | INTRAMUSCULAR | Status: DC | PRN

## 2019-08-11 MED ORDER — HYDROCODONE-ACETAMINOPHEN 5-325 MG PO TABS
1.0000 | ORAL_TABLET | Freq: Four times a day (QID) | ORAL | Status: AC | PRN
  Administered 2019-08-11: 1 via ORAL
  Filled 2019-08-11: qty 1

## 2019-08-11 MED ORDER — PENTAFLUOROPROP-TETRAFLUOROETH EX AERO: 1.0000 "application " | INHALATION_SPRAY | CUTANEOUS | Status: DC | PRN

## 2019-08-11 MED ORDER — SODIUM CHLORIDE 0.9 % IV SOLN: 100.0000 mL | INTRAVENOUS | Status: DC | PRN

## 2019-08-11 MED ORDER — HEPARIN SODIUM (PORCINE) 1000 UNIT/ML IJ SOLN
INTRAMUSCULAR | Status: AC
  Administered 2019-08-11: 12:00:00 3.2 mL
  Filled 2019-08-11: qty 1

## 2019-08-11 MED ORDER — FENTANYL CITRATE (PF) 100 MCG/2ML IJ SOLN
INTRAMUSCULAR | Status: AC
  Filled 2019-08-11: qty 2

## 2019-08-11 MED ORDER — LIDOCAINE HCL 1 % IJ SOLN
INTRAMUSCULAR | Status: AC | PRN
  Administered 2019-08-11: 10 mL

## 2019-08-11 MED ORDER — HEPARIN SODIUM (PORCINE) 5000 UNIT/ML IJ SOLN: 5000.0000 [IU] | Freq: Three times a day (TID) | INTRAMUSCULAR | Status: DC

## 2019-08-11 MED ORDER — GELATIN ABSORBABLE 12-7 MM EX MISC
CUTANEOUS | Status: AC
  Filled 2019-08-11: qty 1

## 2019-08-11 MED ORDER — MIDAZOLAM HCL 2 MG/2ML IJ SOLN
INTRAMUSCULAR | Status: AC | PRN
  Administered 2019-08-11: 1 mg via INTRAVENOUS

## 2019-08-11 MED ORDER — LIDOCAINE-PRILOCAINE 2.5-2.5 % EX CREA: 1.0000 "application " | TOPICAL_CREAM | CUTANEOUS | Status: DC | PRN

## 2019-08-11 MED ORDER — CEFAZOLIN SODIUM-DEXTROSE 2-4 GM/100ML-% IV SOLN
INTRAVENOUS | Status: AC
  Filled 2019-08-11: qty 100

## 2019-08-11 MED ORDER — LIDOCAINE HCL 1 % IJ SOLN
INTRAMUSCULAR | Status: AC
  Filled 2019-08-11: qty 20

## 2019-08-11 MED ORDER — MIDAZOLAM HCL 2 MG/2ML IJ SOLN
INTRAMUSCULAR | Status: AC
  Filled 2019-08-11: qty 2

## 2019-08-11 MED ORDER — FENTANYL CITRATE (PF) 100 MCG/2ML IJ SOLN
INTRAMUSCULAR | Status: AC | PRN
  Administered 2019-08-11: 50 ug via INTRAVENOUS

## 2019-08-11 MED ORDER — CHLORHEXIDINE GLUCONATE CLOTH 2 % EX PADS
6.0000 | MEDICATED_PAD | Freq: Every day | CUTANEOUS | Status: DC
  Administered 2019-08-11 – 2019-08-14 (×4): 6 via TOPICAL

## 2019-08-11 NOTE — Procedures (Signed)
   Interventional Radiology Procedure:   Indications: Acute on chronic renal failure, needs HD catheter for dialysis  Procedure: Tunneled dialysis catheter placement  Findings: Right jugular Palindrome (19 cm), tip at SVC/RA junction  Complications: None     EBL: less than 10 ml  Plan: Catheter is ready to use   Matsuko Kretz R. Anselm Pancoast, MD  Pager: [phone]

## 2019-08-11 NOTE — Progress Notes (Cosign Needed)
APP Student Note for Education Purposes    [facility] Progress Note    Assessment/ Plan:   1. Rapidly progressive renal failure - w/ nephrotic range proteinuria (11gm nov 2020), hx of IDDM, but progressing rapidly over last 12 mos.  Had +ANA , other serologies negative. Reordered ANA w/ titers--> pending. SPEP slightly + in Nov, then repeat Jan 2021 was negative. Admitted for rising creat 6.7 w/ N/V. Alb 2.5.  She is increasingly uremic and will need dialysis as well as the kidney biopsy.  Discussed with pt this AM.  Willing to accept dialysis catheter and dialysis while we assess reversible causes of renal failure with biopsy.  Have discussed with IR, greatly appreciate all their assistance.  Biopsy requisition form filled out and is in the front of pt's paper chart.  Biopsy tomorrow 2/3, hopeful for IR catheter today, will order dialysis as well     2. Anemia - Hb 6.2 repleted with prbc x 1 on 1/31. Today Hb 7.7  Iron Panel on 1/31 with Fe of 22. Will start ESA and iron with dialysis today  3. HTN/ vol: stop IVFs  4. Metabolic acidosis: increase bicarb to 1300 TID  5. DM: on insulin, per primary  6. Dispo: pending  Subjective:    BUN/Cr continue to rise and now in need of dialysis.  Pt is very ill-feeling this AM and endorses headache, nausea, poor appetite, dry heaving, bad taste in mouth, passing gas, "I can't do anything I feel so bad". Patient has no produced any urine today. Denies pruritis. Renal biopsy pushed to tomorrow for [facility] and dialysis today instead.   Objective:   BP (!) 146/75 (BP Location: Right Arm)   Pulse 65   Temp 97.7 F (36.5 C) (Oral)   Resp 17   Ht 5\' 3"  (1.6 m)   Wt 78 kg   LMP 12/17/2016   SpO2 100%   BMI 30.46 kg/m  No intake or output data in the 24 hours ending 08/11/19 0917 Weight change:   Physical Exam: Gen: asleep in bed upon entering room. Patient answered questions without opening eyes. CVS: RRR, no rubs or murmurs  Resp:  normal WOB, some muffled bibasilar breath sounds Abd: nontender to palpation Ext: trace LE edema, no asterixis appreciated   Imaging: DG Abd 1 View  Result Date: 08/11/2019 CLINICAL DATA:  Acute abdominal pain EXAM: ABDOMEN - 1 VIEW COMPARISON:  Abdominal CT 11/03/2018 FINDINGS: Normal bowel gas pattern. No concerning mass effect or gas collection. Splenic atherosclerotic calcification. IMPRESSION: Normal bowel gas pattern. Electronically Signed   By: Monte Fantasia M.D.   On: 08/11/2019 04:35   CT LUMBAR SPINE WO CONTRAST  Result Date: 08/09/2019 CLINICAL DATA:  49 year old female with increasing low back pain for several days. Monoclonal gammopathy of unknown significance. Osteoporosis. EXAM: CT LUMBAR SPINE WITHOUT CONTRAST TECHNIQUE: Multidetector CT imaging of the lumbar spine was performed without intravenous contrast administration. Multiplanar CT image reconstructions were also generated. COMPARISON:  Radiographic skeletal survey 07/23/2019. CT Abdomen and Pelvis 11/03/2018. FINDINGS: Segmentation: Normal. Alignment: Stable from the skeletal survey earlier this month. Relatively maintained lumbar lordosis. No spondylolisthesis. Vertebrae: Background diffuse osteopenia. Visible lower thoracic levels appear intact. Moderate to severe L1 compression fracture with anterior wedging stable from the radiographs earlier this month. Prominent gas within the fractured vertebral body and the adjacent L1-L2 disc. No significant retropulsion of bone. L1 posterior elements appear intact. Remaining lumbar levels appear intact. Visible sacrum and SI joints appear intact. Paraspinal  and other soft tissues: Chronic cholelithiasis with a hazy appearance of gallbladder appears similar to the 2020 CT Abdomen and Pelvis. Pleural fluid at the costophrenic angles is also redemonstrated. There is a small to moderate volume of free fluid in the visible pelvis, cul-de-sac with simple fluid density (series 4, image 144).  Superimposed extensive abdominal and pelvic visceral artery calcified atherosclerosis. Disc levels: Mild for age lumbar spine degeneration. IMPRESSION: 1. L1 compression fracture is stable from the skeletal survey earlier this month. 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 And there is posttraumatic or degenerative gas in the adjacent L1-L2 disc. No significant retropulsion of bone or complicating features. 2. Osteopenia. No other acute osseous abnormality in the lumbar spine. 3. Nonspecific small volume of ascites in the visible pelvis. Pleural fluid visible in the costophrenic angles. 4. Chronic cholelithiasis. 5. Extensive calcified atherosclerosis. Electronically Signed   By: Genevie Ann M.D.   On: 08/09/2019 18:31   US RENAL  Result Date: 08/09/2019 CLINICAL DATA:  Acute kidney injury EXAM: RENAL / URINARY TRACT ULTRASOUND COMPLETE COMPARISON:  03/05/2019 FINDINGS: Right Kidney: Renal measurements: 10.6 x 5.1 x 5 cm = volume: 136 mL . Echogenicity within normal limits. No mass or hydronephrosis visualized. Left Kidney: Renal measurements: 10.9 x 5.5 x 5.6 = volume: 175 mL. Echogenicity within normal limits. No mass or hydronephrosis visualized. Bladder: Not visualized Other: None. IMPRESSION: 1. No acute abnormality.  No hydronephrosis. 2. The bladder was not visualized on this exam. Electronically Signed   By: Constance Holster M.D.   On: 08/09/2019 22:49   ECHOCARDIOGRAM COMPLETE  Result Date: 08/09/2019   ECHOCARDIOGRAM REPORT   Patient Name:   Melanie Hall Date of Exam: 08/09/2019 Medical Rec #:  [id number]     Height:       63.0 in Accession #:    [id number]    Weight:       145.0 lb Date of Birth:  12-25-1970     BSA:          1.69 m Patient Age:    20 years      BP:           115/70 mmHg Patient Gender: F             HR:           70 bpm. Exam Location:  Inpatient Procedure: 2D Echo Indications:    TIA 435.9  History:        Patient has no prior history of  Echocardiogram examinations.                 Risk Factors:Hypertension and Diabetes.  Sonographer:    Jannett Celestine RDCS (AE) Referring Phys: [place]  1. Left ventricular ejection fraction, by visual estimation, is 65 to 70%. The left ventricle has hyperdynamic function. There is no left ventricular hypertrophy.  2. Left ventricular diastolic parameters are indeterminate.  3. The left ventricle has no regional wall motion abnormalities.  4. Global right ventricle has normal systolic function.The right ventricular size is normal. No increase in right ventricular wall thickness.  5. Left atrial size was mildly dilated.  6. Right atrial size was normal.  7. The mitral valve is normal in structure. Trivial mitral valve regurgitation. No evidence of mitral stenosis.  8. The tricuspid valve is normal in structure.  9. The tricuspid valve is normal in structure. Tricuspid valve regurgitation is not demonstrated. 10. The aortic valve has  an indeterminant number of cusps. Aortic valve regurgitation is not visualized. No evidence of aortic valve sclerosis or stenosis. 11. The pulmonic valve was not well visualized. Pulmonic valve regurgitation is not visualized. 12. The inferior vena cava is dilated in size with <50% respiratory variability, suggesting right atrial pressure of 15 mmHg. FINDINGS  Left Ventricle: Left ventricular ejection fraction, by visual estimation, is 65 to 70%. The left ventricle has hyperdynamic function. The left ventricle has no regional wall motion abnormalities. There is no left ventricular hypertrophy. Left ventricular diastolic parameters are indeterminate. Right Ventricle: The right ventricular size is normal. No increase in right ventricular wall thickness. Global RV systolic function is has normal systolic function. Left Atrium: Left atrial size was mildly dilated. Right Atrium: Right atrial size was normal in size Pericardium: There is no evidence of pericardial  effusion. Mitral Valve: The mitral valve is normal in structure. Trivial mitral valve regurgitation. No evidence of mitral valve stenosis by observation. Tricuspid Valve: The tricuspid valve is normal in structure. Tricuspid valve regurgitation is not demonstrated. Aortic Valve: The aortic valve has an indeterminant number of cusps. Aortic valve regurgitation is not visualized. The aortic valve is structurally normal, with no evidence of sclerosis or stenosis. Aortic valve mean gradient measures 3.2 mmHg. Aortic valve peak gradient measures 7.1 mmHg. Aortic valve area, by VTI measures 3.15 cm. Pulmonic Valve: The pulmonic valve was not well visualized. Pulmonic valve regurgitation is not visualized. Pulmonic regurgitation is not visualized. No evidence of pulmonic stenosis. Aorta: The aortic root is normal in size and structure. Venous: The inferior vena cava is dilated in size with less than 50% respiratory variability, suggesting right atrial pressure of 15 mmHg. IAS/Shunts: No atrial level shunt detected by color flow Doppler.  LEFT VENTRICLE PLAX 2D LVIDd:         4.40 cm  Diastology LVIDs:         2.60 cm  LV e' lateral:   6.42 cm/s LV PW:         1.00 cm  LV E/e' lateral: 13.1 LV IVS:        0.70 cm  LV e' medial:    6.74 cm/s LVOT diam:     2.10 cm  LV E/e' medial:  12.5 LV SV:         63 ml LV SV Index:   36.60 LVOT Area:     3.46 cm  RIGHT VENTRICLE RV S prime:     10.90 cm/s TAPSE (M-mode): 2.1 cm LEFT ATRIUM             Index       RIGHT ATRIUM           Index LA diam:        3.50 cm 2.08 cm/m  RA Area:     15.40 cm LA Vol (A2C):   52.3 ml 31.01 ml/m RA Volume:   38.50 ml  22.83 ml/m LA Vol (A4C):   48.6 ml 28.81 ml/m LA Biplane Vol: 50.4 ml 29.88 ml/m  AORTIC VALVE AV Area (Vmax):    2.73 cm AV Area (Vmean):   3.16 cm AV Area (VTI):     3.15 cm AV Vmax:           133.02 cm/s AV Vmean:          82.486 cm/s AV VTI:            0.227 m AV Peak Grad:      7.1 mmHg AV  Mean Grad:      3.2 mmHg LVOT  Vmax:         105.00 cm/s LVOT Vmean:        75.300 cm/s LVOT VTI:          0.206 m LVOT/AV VTI ratio: 0.91  AORTA Ao Root diam: 2.70 cm MITRAL VALVE MV Area (PHT): 2.80 cm             SHUNTS MV PHT:        78.59 msec           Systemic VTI:  0.21 m MV Decel Time: 271 msec             Systemic Diam: 2.10 cm MV E velocity: 84.00 cm/s 103 cm/s MV A velocity: 58.70 cm/s 70.3 cm/s MV E/A ratio:  1.43       1.5  Carlyle Dolly MD Electronically signed by Carlyle Dolly MD Signature Date/Time: 08/09/2019/1:42:30 PM    Final     Labs: BMET Recent Labs  Lab 08/08/19 1607 08/09/19 0829 08/10/19 0635 08/11/19 0522  NA 133* 136 136 133*  K 4.2 4.0 4.5 4.2  CL 106 107 106 106  CO2 16* 15* 14* 12*  GLUCOSE 263* 170* 103* 158*  BUN 73* 73* 83* 96*  CREATININE 6.40* 6.76* 7.00* 7.54*  CALCIUM 8.1* 7.7* 7.7* 7.3*   CBC Recent Labs  Lab 08/08/19 1607 08/08/19 1607 08/09/19 0829 08/09/19 2321 08/10/19 0635 08/11/19 0522  WBC 11.7*  --  8.0  --  8.1 7.1  NEUTROABS 8.8*  --   --   --   --   --   HGB 7.5*   < > 6.2* 8.0* 7.3* 7.7*  HCT 22.9*   < > 18.4* 23.3* 22.3* 23.3*  MCV 94.6  --  92.0  --  91.8 91.0  PLT 193  --  138*  --  182 174   < > = values in this interval not displayed.    Medications:    . amLODipine  5 mg Oral Daily  . carvedilol  6.25 mg Oral BID WC  . Chlorhexidine Gluconate Cloth  6 each Topical Q0600  . gabapentin  100 mg Oral BID  . heparin  5,000 Units Subcutaneous Q8H  . insulin aspart  0-9 Units Subcutaneous TID WC  . insulin aspart protamine- aspart  15 Units Subcutaneous TID AC  . levothyroxine  25 mcg Oral Daily  . pantoprazole  40 mg Oral QHS  . pravastatin  40 mg Oral q1800  . sodium bicarbonate  1,300 mg Oral TID      Harrell Gave, PA Student 08/11/2019, 9:17 AM

## 2019-08-11 NOTE — Progress Notes (Signed)
RN paged NP for new onset severe abdominal pain.  Pt had been having abdominal cramping and diarrhea prior to admission and was to be tested for C-Diff but no stool for past 24 hrs per RN.  KUB ordered, one time Norco.

## 2019-08-11 NOTE — Plan of Care (Signed)
  Problem: Pain Managment: Goal: General experience of comfort will improve Outcome: Progressing   

## 2019-08-11 NOTE — Progress Notes (Signed)
[facility] Progress Note    Assessment/ Plan:   1. Rapidly progressive renal failure - w/ nephrotic range proteinuria (11gm nov 2020), hx of IDDM, but progressing rapidly over last 12 mos.  Had +ANA , other serologies negative. Reordered ANA w/ titers--> pending. SPEP slightly + in Nov, then repeat Jan 2021 was negative. Admitted for rising creat 6.7 w/ N/V. Alb 2.5.  She is increasingly uremic and will need dialysis as well as the kidney biopsy.  Discussed with pt this AM.  Willing to accept dialysis catheter and dialysis while we assess reversible causes of renal failure with biopsy.  Have discussed with IR, greatly appreciate all their assistance.  Biopsy requisition form filled out and is in the front of pt's paper chart.  Biopsy today 2/2, hopeful for IR catheter today, will order dialysis as well     2. Anemia - Hb 6.2, sig issue. Getting prbc x 1.  Will start ESA and iron with dialysis  3. HTN/ vol: stop IVFs  4. Metabolic acidosis: increase bicarb to 1300 TID  5. DM: on insulin, per primary  6. Dispo: pending  Subjective:    BUN/Cr continue to rise.  Pt is tearful this AM--> reports nausea, poor appetite, bad taste in mouth, "I can't do anything I feel so bad".  For renal biopsy today.     Objective:   BP (!) 146/75 (BP Location: Right Arm)   Pulse 65   Temp 97.7 F (36.5 C) (Oral)   Resp 17   Ht 5\' 3"  (1.6 m)   Wt 78 kg   LMP 12/17/2016   SpO2 100%   BMI 30.46 kg/m  No intake or output data in the 24 hours ending 08/11/19 0800 Weight change:   Physical Exam: Gen: sitting up in bed, tearful CVS: RRR Resp: normal WOB, some muffled bibasilar breath sounds Abd: nontender Ext: trace LE edema  Imaging: DG Abd 1 View  Result Date: 08/11/2019 CLINICAL DATA:  Acute abdominal pain EXAM: ABDOMEN - 1 VIEW COMPARISON:  Abdominal CT 11/03/2018 FINDINGS: Normal bowel gas pattern. No concerning mass effect or gas collection. Splenic atherosclerotic  calcification. IMPRESSION: Normal bowel gas pattern. Electronically Signed   By: Monte Fantasia M.D.   On: 08/11/2019 04:35   CT LUMBAR SPINE WO CONTRAST  Result Date: 08/09/2019 CLINICAL DATA:  49 year old female with increasing low back pain for several days. Monoclonal gammopathy of unknown significance. Osteoporosis. EXAM: CT LUMBAR SPINE WITHOUT CONTRAST TECHNIQUE: Multidetector CT imaging of the lumbar spine was performed without intravenous contrast administration. Multiplanar CT image reconstructions were also generated. COMPARISON:  Radiographic skeletal survey 07/23/2019. CT Abdomen and Pelvis 11/03/2018. FINDINGS: Segmentation: Normal. Alignment: Stable from the skeletal survey earlier this month. Relatively maintained lumbar lordosis. No spondylolisthesis. Vertebrae: Background diffuse osteopenia. Visible lower thoracic levels appear intact. Moderate to severe L1 compression fracture with anterior wedging stable from the radiographs earlier this month. Prominent gas within the fractured vertebral body and the adjacent L1-L2 disc. No significant retropulsion of bone. L1 posterior elements appear intact. Remaining lumbar levels appear intact. Visible sacrum and SI joints appear intact. Paraspinal and other soft tissues: Chronic cholelithiasis with a hazy appearance of gallbladder appears similar to the 2020 CT Abdomen and Pelvis. Pleural fluid at the costophrenic angles is also redemonstrated. There is a small to moderate volume of free fluid in the visible pelvis, cul-de-sac with simple fluid density (series 4, image 144). Superimposed extensive abdominal and pelvic visceral artery calcified atherosclerosis. Disc levels: Mild for  age lumbar spine degeneration. IMPRESSION: 1. L1 compression fracture is stable from the skeletal survey earlier this month. 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 And there is posttraumatic or degenerative gas in the adjacent L1-L2  disc. No significant retropulsion of bone or complicating features. 2. Osteopenia. No other acute osseous abnormality in the lumbar spine. 3. Nonspecific small volume of ascites in the visible pelvis. Pleural fluid visible in the costophrenic angles. 4. Chronic cholelithiasis. 5. Extensive calcified atherosclerosis. Electronically Signed   By: Genevie Ann M.D.   On: 08/09/2019 18:31   US RENAL  Result Date: 08/09/2019 CLINICAL DATA:  Acute kidney injury EXAM: RENAL / URINARY TRACT ULTRASOUND COMPLETE COMPARISON:  03/05/2019 FINDINGS: Right Kidney: Renal measurements: 10.6 x 5.1 x 5 cm = volume: 136 mL . Echogenicity within normal limits. No mass or hydronephrosis visualized. Left Kidney: Renal measurements: 10.9 x 5.5 x 5.6 = volume: 175 mL. Echogenicity within normal limits. No mass or hydronephrosis visualized. Bladder: Not visualized Other: None. IMPRESSION: 1. No acute abnormality.  No hydronephrosis. 2. The bladder was not visualized on this exam. Electronically Signed   By: Constance Holster M.D.   On: 08/09/2019 22:49   ECHOCARDIOGRAM COMPLETE  Result Date: 08/09/2019   ECHOCARDIOGRAM REPORT   Patient Name:   Melanie Hall Date of Exam: 08/09/2019 Medical Rec #:  [id number]     Height:       63.0 in Accession #:    [id number]    Weight:       145.0 lb Date of Birth:  20-Jun-1971     BSA:          1.69 m Patient Age:    72 years      BP:           115/70 mmHg Patient Gender: F             HR:           70 bpm. Exam Location:  Inpatient Procedure: 2D Echo Indications:    TIA 435.9  History:        Patient has no prior history of Echocardiogram examinations.                 Risk Factors:Hypertension and Diabetes.  Sonographer:    Jannett Celestine RDCS (AE) Referring Phys: [place]  1. Left ventricular ejection fraction, by visual estimation, is 65 to 70%. The left ventricle has hyperdynamic function. There is no left ventricular hypertrophy.  2. Left ventricular diastolic parameters  are indeterminate.  3. The left ventricle has no regional wall motion abnormalities.  4. Global right ventricle has normal systolic function.The right ventricular size is normal. No increase in right ventricular wall thickness.  5. Left atrial size was mildly dilated.  6. Right atrial size was normal.  7. The mitral valve is normal in structure. Trivial mitral valve regurgitation. No evidence of mitral stenosis.  8. The tricuspid valve is normal in structure.  9. The tricuspid valve is normal in structure. Tricuspid valve regurgitation is not demonstrated. 10. The aortic valve has an indeterminant number of cusps. Aortic valve regurgitation is not visualized. No evidence of aortic valve sclerosis or stenosis. 11. The pulmonic valve was not well visualized. Pulmonic valve regurgitation is not visualized. 12. The inferior vena cava is dilated in size with <50% respiratory variability, suggesting right atrial pressure of 15 mmHg. FINDINGS  Left Ventricle: Left ventricular ejection fraction, by visual estimation, is 65  to 70%. The left ventricle has hyperdynamic function. The left ventricle has no regional wall motion abnormalities. There is no left ventricular hypertrophy. Left ventricular diastolic parameters are indeterminate. Right Ventricle: The right ventricular size is normal. No increase in right ventricular wall thickness. Global RV systolic function is has normal systolic function. Left Atrium: Left atrial size was mildly dilated. Right Atrium: Right atrial size was normal in size Pericardium: There is no evidence of pericardial effusion. Mitral Valve: The mitral valve is normal in structure. Trivial mitral valve regurgitation. No evidence of mitral valve stenosis by observation. Tricuspid Valve: The tricuspid valve is normal in structure. Tricuspid valve regurgitation is not demonstrated. Aortic Valve: The aortic valve has an indeterminant number of cusps. Aortic valve regurgitation is not visualized. The  aortic valve is structurally normal, with no evidence of sclerosis or stenosis. Aortic valve mean gradient measures 3.2 mmHg. Aortic valve peak gradient measures 7.1 mmHg. Aortic valve area, by VTI measures 3.15 cm. Pulmonic Valve: The pulmonic valve was not well visualized. Pulmonic valve regurgitation is not visualized. Pulmonic regurgitation is not visualized. No evidence of pulmonic stenosis. Aorta: The aortic root is normal in size and structure. Venous: The inferior vena cava is dilated in size with less than 50% respiratory variability, suggesting right atrial pressure of 15 mmHg. IAS/Shunts: No atrial level shunt detected by color flow Doppler.  LEFT VENTRICLE PLAX 2D LVIDd:         4.40 cm  Diastology LVIDs:         2.60 cm  LV e' lateral:   6.42 cm/s LV PW:         1.00 cm  LV E/e' lateral: 13.1 LV IVS:        0.70 cm  LV e' medial:    6.74 cm/s LVOT diam:     2.10 cm  LV E/e' medial:  12.5 LV SV:         63 ml LV SV Index:   36.60 LVOT Area:     3.46 cm  RIGHT VENTRICLE RV S prime:     10.90 cm/s TAPSE (M-mode): 2.1 cm LEFT ATRIUM             Index       RIGHT ATRIUM           Index LA diam:        3.50 cm 2.08 cm/m  RA Area:     15.40 cm LA Vol (A2C):   52.3 ml 31.01 ml/m RA Volume:   38.50 ml  22.83 ml/m LA Vol (A4C):   48.6 ml 28.81 ml/m LA Biplane Vol: 50.4 ml 29.88 ml/m  AORTIC VALVE AV Area (Vmax):    2.73 cm AV Area (Vmean):   3.16 cm AV Area (VTI):     3.15 cm AV Vmax:           133.02 cm/s AV Vmean:          82.486 cm/s AV VTI:            0.227 m AV Peak Grad:      7.1 mmHg AV Mean Grad:      3.2 mmHg LVOT Vmax:         105.00 cm/s LVOT Vmean:        75.300 cm/s LVOT VTI:          0.206 m LVOT/AV VTI ratio: 0.91  AORTA Ao Root diam: 2.70 cm MITRAL VALVE MV Area (PHT): 2.80 cm  SHUNTS MV PHT:        78.59 msec           Systemic VTI:  0.21 m MV Decel Time: 271 msec             Systemic Diam: 2.10 cm MV E velocity: 84.00 cm/s 103 cm/s MV A velocity: 58.70 cm/s 70.3 cm/s MV  E/A ratio:  1.43       1.5  Carlyle Dolly MD Electronically signed by Carlyle Dolly MD Signature Date/Time: 08/09/2019/1:42:30 PM    Final     Labs: BMET Recent Labs  Lab 08/08/19 1607 08/09/19 0829 08/10/19 0635 08/11/19 0522  NA 133* 136 136 133*  K 4.2 4.0 4.5 4.2  CL 106 107 106 106  CO2 16* 15* 14* 12*  GLUCOSE 263* 170* 103* 158*  BUN 73* 73* 83* 96*  CREATININE 6.40* 6.76* 7.00* 7.54*  CALCIUM 8.1* 7.7* 7.7* 7.3*   CBC Recent Labs  Lab 08/08/19 1607 08/08/19 1607 08/09/19 0829 08/09/19 2321 08/10/19 0635 08/11/19 0522  WBC 11.7*  --  8.0  --  8.1 7.1  NEUTROABS 8.8*  --   --   --   --   --   HGB 7.5*   < > 6.2* 8.0* 7.3* 7.7*  HCT 22.9*   < > 18.4* 23.3* 22.3* 23.3*  MCV 94.6  --  92.0  --  91.8 91.0  PLT 193  --  138*  --  182 174   < > = values in this interval not displayed.    Medications:    . amLODipine  5 mg Oral Daily  . carvedilol  6.25 mg Oral BID WC  . gabapentin  100 mg Oral BID  . heparin  5,000 Units Subcutaneous Q8H  . insulin aspart  0-9 Units Subcutaneous TID WC  . insulin aspart protamine- aspart  15 Units Subcutaneous TID AC  . levothyroxine  25 mcg Oral Daily  . pantoprazole  40 mg Oral QHS  . pravastatin  40 mg Oral q1800  . sodium bicarbonate  1,300 mg Oral TID      Madelon Lips MD 08/11/2019, 8:00 AM

## 2019-08-11 NOTE — Progress Notes (Signed)
PROGRESS NOTE    Melanie Hall  ZOX:[id number] DOB: 03-20-1971 DOA: 08/08/2019 PCP: [facility]   Brief Narrative:  Melanie Hall is a 49 y.o. female with medical history significant of chronic kidney disease stage V, hypertension, hyperlipidemia, diabetes mellitus type 2 who presented to the [facility] with intermittent chest pain, uncontrolled hypertension. Patient follows with nephrology as outpatient and was supposed to get an Epogen shot but was told she cannot get it as her blood pressure is over 811 systolic.  She complained of a gradual onset diffuse headache as well as right arm weakness which is been there for the past 2 weeks. She also has intermittent chest pain on the right side of her chest that comes and goes lasting for several minutes to hours at a time.here are some associated shortness of breath.  No PND.  No fever, chills, cough.  Upon arrival to ED, she was hemodynamically stable.  All the rest of the labs were fairly stable except her creatinine was 6.4, slightly worse from 5.19 on 07/23/2019.  Due to strokelike symptoms, she ended up having CT of the head which was unremarkable and subsequently she was transferred to [facility] in order to get MRI.  MRI was unremarkable.  Patient then started having worsening renal function.  Nephrology was consulted.  She started having uremic symptoms.  Nephrology initially called IR for renal biopsy but now there is urgent need of dialysis so she is going to have tunneled catheter today and renal biopsy tomorrow.  Assessment & Plan:   Principal Problem:   Acute on chronic renal failure (HCC) Active Problems:   Type 2 diabetes with nephropathy (HCC)   HYPERCHOLESTEROLEMIA   GERD (gastroesophageal reflux disease)   MGUS (monoclonal gammopathy of unknown significance)   Accelerated hypertension   Chest pain   Right arm weakness   Right arm weakness/headache: She does not complain of right arm weakness anymore and she  has no focal neurological deficit.  CT head as well as MRI brain unremarkable for any stroke.  Acute kidney injury on chronic kidney disease stage IV/progressive chronic kidney disease/uremia: Her creatinine is worse than yesterday.  She came in with 6.40 >6.76>7.0> 7.54.  She has signs of rapidly progressive chronic kidney disease.  She has some nausea and abdominal pain, signs of uremia as well.  Nephrology saw patient.  Renal biopsy deferred for tomorrow.  She is going to get tunnel catheter and then dialysis today.  Appreciate nephrology help.  Diarrhea: Patient complains of having intermittent diarrhea for the past 3 months.  No mention of diarrhea in H&P.  Not sure if she ever mention to anybody else. She has not had any bowel movement in last 2 to 3 days. C. difficile, GI pathogen and ova and parasite were ordered but were never collected due to her not having any bowel movement.  Precautions will be discontinued today.  Acute on chronic anemia of chronic disease: Hemoglobin dropped from 7.5-6.2. She received 1 unit of PRBC transfusion on 2019-08-09. Posttransfusion hemoglobin 8.0 and then dropped back to 7.3> 7.7 . Iron studies indicate anemia of chronic disease. FOBT pending.  Acute on chronic low back pain: Patient complains of low back pain which is actually chronic but has been worse now.  Also complains of bilateral hip pain.  She also told me that she sometimes cannot feel when she has to pass stool and she has incontinence.  Denied any saddle anesthesia or any lower extremity weakness. Due  to concern of spinal stenosis, CT lumbar spine was obtained which showed L1 compression fracture is stable from the skeletal survey earlier this month. 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 And there is posttraumatic or degenerative gas in the adjacent L1-L2 disc. No significant retropulsion of bone or complicating features.  Uncontrolled essential hypertension:  Blood pressure mostly controlled but fluctuates. Continue home dose of amlodipine and carvedilol.  Type 2 diabetes mellitus: Takes NovoLog 70/30 15 units 3 times daily at home.  Blood sugar fairly controlled with some elevation here and there.  Continue current regimen along with SSI.  Hyperlipidemia: Continue lovastatin.  Chronic bilateral pleural effusion: No hypoxia.  Monitor.  Chronic nonanion gap metabolic acidosis: Worsening, likely secondary to worsening renal function.  Consulting nephrology to see if they would start her on bicarb drip.  Monoclonal gammopathy of undetermined significance: Patient is being followed by Dr. Delton Coombes from oncology service as outpatient. Recently noted to have nephrotic range proteinuria. Also found to have M spike of 0.2 g. Light chain ratio is mildly elevated at 2.36.  Plan was to have a skeletal survey and a repeat of myeloma labs including immunofixation.  Hypothyroidism: Continue Synthroid.  GERD: Continue PPI  Migraine headache: No relief with Tylenol. Tells me that she takes Excedrin at home. Requesting a dose here. Will prescribe one dose.  DVT prophylaxis: Heparin Code Status: Full code Family Communication:  None present at bedside.  Plan of care discussed with patient in length and he verbalized understanding and agreed with it. Patient came from: Home Disposition Plan: Will be discharged home with home health once improved and cleared by nephrology. Barriers to discharge: Medical improvement in need of procedures.  Estimated body mass index is 30.46 kg/m as calculated from the following:   Height as of this encounter: 5\' 3"  (1.6 m).   Weight as of this encounter: 78 kg.      Nutritional status:               Consultants:   Nephrology/IR  Procedures:   None  Antimicrobials:   None   Subjective: Seen and examined.  She complains of nausea.  Also had some abdominal pain this morning.  Chest x-ray  unremarkable.  No other complaint.  Objective: Vitals:   08/11/19 0015 08/11/19 0343 08/11/19 0626 08/11/19 1108  BP: 133/76 (!) 146/75  117/73  Pulse: 64 65  62  Resp: 18 17  (!) 31  Temp: (!) 97.5 F (36.4 C) 97.7 F (36.5 C)    TempSrc: Oral Oral    SpO2: 100% 100%  98%  Weight:   78 kg   Height:       No intake or output data in the 24 hours ending 08/11/19 1116 Filed Weights   08/08/19 1532 08/11/19 0626  Weight: 65.8 kg 78 kg    Examination:  General exam: Appears calm and comfortable  Respiratory system: Clear to auscultation. Respiratory effort normal. Cardiovascular system: S1 & S2 heard, RRR. No JVD, murmurs, rubs, gallops or clicks. No pedal edema. Gastrointestinal system: Abdomen is nondistended, soft and nontender. No organomegaly or masses felt. Normal bowel sounds heard. Central nervous system: Alert and oriented. No focal neurological deficits. Extremities: Symmetric 5 x 5 power. Skin: No rashes, lesions or ulcers.  Psychiatry: Judgement and insight appear poor. Mood & affect flat.    Data Reviewed: I have personally reviewed following labs and imaging studies  CBC: Recent Labs  Lab 08/08/19 1607 08/09/19 0829 08/09/19  2321 08/10/19 0635 08/11/19 0522  WBC 11.7* 8.0  --  8.1 7.1  NEUTROABS 8.8*  --   --   --   --   HGB 7.5* 6.2* 8.0* 7.3* 7.7*  HCT 22.9* 18.4* 23.3* 22.3* 23.3*  MCV 94.6 92.0  --  91.8 91.0  PLT 193 138*  --  182 846   Basic Metabolic Panel: Recent Labs  Lab 08/08/19 1607 08/09/19 0829 08/10/19 0635 08/11/19 0522  NA 133* 136 136 133*  K 4.2 4.0 4.5 4.2  CL 106 107 106 106  CO2 16* 15* 14* 12*  GLUCOSE 263* 170* 103* 158*  BUN 73* 73* 83* 96*  CREATININE 6.40* 6.76* 7.00* 7.54*  CALCIUM 8.1* 7.7* 7.7* 7.3*  MG  --  1.7  --   --    GFR: Estimated Creatinine Clearance: 8.9 mL/min (A) (by C-G formula based on SCr of 7.54 mg/dL (H)). Liver Function Tests: Recent Labs  Lab 08/08/19 1607  AST 52*  ALT 47*  ALKPHOS  82  BILITOT 1.0  PROT 6.4*  ALBUMIN 2.5*   Recent Labs  Lab 08/08/19 1607  LIPASE 17   No results for input(s): AMMONIA in the last 168 hours. Coagulation Profile: Recent Labs  Lab 08/11/19 0522  INR 1.2   Cardiac Enzymes: No results for input(s): CKTOTAL, CKMB, CKMBINDEX, TROPONINI in the last 168 hours. BNP (last 3 results) No results for input(s): PROBNP in the last 8760 hours. HbA1C: No results for input(s): HGBA1C in the last 72 hours. CBG: Recent Labs  Lab 08/10/19 0617 08/10/19 1105 08/10/19 1627 08/10/19 2219 08/11/19 0622  GLUCAP 105* 79 154* 145* 134*   Lipid Profile: No results for input(s): CHOL, HDL, LDLCALC, TRIG, CHOLHDL, LDLDIRECT in the last 72 hours. Thyroid Function Tests: No results for input(s): TSH, T4TOTAL, FREET4, T3FREE, THYROIDAB in the last 72 hours. Anemia Panel: Recent Labs    08/09/19 1317  VITAMINB12 830  FERRITIN 303  TIBC 203*  IRON 22*   Sepsis Labs: No results for input(s): PROCALCITON, LATICACIDVEN in the last 168 hours.  Recent Results (from the past 240 hour(s))  Respiratory Panel by RT PCR (Flu A&B, Covid) - Nasopharyngeal Swab     Status: None   Collection Time: 08/08/19  5:50 PM   Specimen: Nasopharyngeal Swab  Result Value Ref Range Status   SARS Coronavirus 2 by RT PCR NEGATIVE NEGATIVE Final    Comment: (NOTE) SARS-CoV-2 target nucleic acids are NOT DETECTED. The SARS-CoV-2 RNA is generally detectable in upper respiratoy specimens during the acute phase of infection. The lowest concentration of SARS-CoV-2 viral copies this assay can detect is 131 copies/mL. A negative result does not preclude SARS-Cov-2 infection and should not be used as the sole basis for treatment or other patient management decisions. A negative result may occur with  improper specimen collection/handling, submission of specimen other than nasopharyngeal swab, presence of viral mutation(s) within the areas targeted by this assay, and  inadequate number of viral copies (<131 copies/mL). A negative result must be combined with clinical observations, patient history, and epidemiological information. The expected result is Negative. Fact Sheet for Patients:  PinkCheek.be Fact Sheet for Healthcare Providers:  GravelBags.it This test is not yet ap proved or cleared by the [place] FDA and  has been authorized for detection and/or diagnosis of SARS-CoV-2 by FDA under an Emergency Use Authorization (EUA). This EUA will remain  in effect (meaning this test can be used) for the duration of the COVID-19 declaration under  Section 564(b)(1) of the Act, 21 U.S.C. section 360bbb-3(b)(1), unless the authorization is terminated or revoked sooner.    Influenza A by PCR NEGATIVE NEGATIVE Final   Influenza B by PCR NEGATIVE NEGATIVE Final    Comment: (NOTE) The Xpert Xpress SARS-CoV-2/FLU/RSV assay is intended as an aid in  the diagnosis of influenza from Nasopharyngeal swab specimens and  should not be used as a sole basis for treatment. Nasal washings and  aspirates are unacceptable for Xpert Xpress SARS-CoV-2/FLU/RSV  testing. Fact Sheet for Patients: PinkCheek.be Fact Sheet for Healthcare Providers: GravelBags.it This test is not yet approved or cleared by the [place] FDA and  has been authorized for detection and/or diagnosis of SARS-CoV-2 by  FDA under an Emergency Use Authorization (EUA). This EUA will remain  in effect (meaning this test can be used) for the duration of the  Covid-19 declaration under Section 564(b)(1) of the Act, 21  U.S.C. section 360bbb-3(b)(1), unless the authorization is  terminated or revoked. Performed at [facility], [address], [place], [place] [address]       Radiology Studies: DG Abd 1 View  Result Date: 08/11/2019 CLINICAL DATA:  Acute abdominal pain EXAM:  ABDOMEN - 1 VIEW COMPARISON:  Abdominal CT 11/03/2018 FINDINGS: Normal bowel gas pattern. No concerning mass effect or gas collection. Splenic atherosclerotic calcification. IMPRESSION: Normal bowel gas pattern. Electronically Signed   By: Monte Fantasia M.D.   On: 08/11/2019 04:35   CT LUMBAR SPINE WO CONTRAST  Result Date: 08/09/2019 CLINICAL DATA:  49 year old female with increasing low back pain for several days. Monoclonal gammopathy of unknown significance. Osteoporosis. EXAM: CT LUMBAR SPINE WITHOUT CONTRAST TECHNIQUE: Multidetector CT imaging of the lumbar spine was performed without intravenous contrast administration. Multiplanar CT image reconstructions were also generated. COMPARISON:  Radiographic skeletal survey 07/23/2019. CT Abdomen and Pelvis 11/03/2018. FINDINGS: Segmentation: Normal. Alignment: Stable from the skeletal survey earlier this month. Relatively maintained lumbar lordosis. No spondylolisthesis. Vertebrae: Background diffuse osteopenia. Visible lower thoracic levels appear intact. Moderate to severe L1 compression fracture with anterior wedging stable from the radiographs earlier this month. Prominent gas within the fractured vertebral body and the adjacent L1-L2 disc. No significant retropulsion of bone. L1 posterior elements appear intact. Remaining lumbar levels appear intact. Visible sacrum and SI joints appear intact. Paraspinal and other soft tissues: Chronic cholelithiasis with a hazy appearance of gallbladder appears similar to the 2020 CT Abdomen and Pelvis. Pleural fluid at the costophrenic angles is also redemonstrated. There is a small to moderate volume of free fluid in the visible pelvis, cul-de-sac with simple fluid density (series 4, image 144). Superimposed extensive abdominal and pelvic visceral artery calcified atherosclerosis. Disc levels: Mild for age lumbar spine degeneration. IMPRESSION: 1. L1 compression fracture is stable from the skeletal survey earlier  this month. 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 And there is posttraumatic or degenerative gas in the adjacent L1-L2 disc. No significant retropulsion of bone or complicating features. 2. Osteopenia. No other acute osseous abnormality in the lumbar spine. 3. Nonspecific small volume of ascites in the visible pelvis. Pleural fluid visible in the costophrenic angles. 4. Chronic cholelithiasis. 5. Extensive calcified atherosclerosis. Electronically Signed   By: Genevie Ann M.D.   On: 08/09/2019 18:31   US RENAL  Result Date: 08/09/2019 CLINICAL DATA:  Acute kidney injury EXAM: RENAL / URINARY TRACT ULTRASOUND COMPLETE COMPARISON:  03/05/2019 FINDINGS: Right Kidney: Renal measurements: 10.6 x 5.1 x 5 cm = volume: 136 mL . Echogenicity  within normal limits. No mass or hydronephrosis visualized. Left Kidney: Renal measurements: 10.9 x 5.5 x 5.6 = volume: 175 mL. Echogenicity within normal limits. No mass or hydronephrosis visualized. Bladder: Not visualized Other: None. IMPRESSION: 1. No acute abnormality.  No hydronephrosis. 2. The bladder was not visualized on this exam. Electronically Signed   By: Constance Holster M.D.   On: 08/09/2019 22:49   ECHOCARDIOGRAM COMPLETE  Result Date: 08/09/2019   ECHOCARDIOGRAM REPORT   Patient Name:   Melanie Hall Date of Exam: 08/09/2019 Medical Rec #:  [id number]     Height:       63.0 in Accession #:    [id number]    Weight:       145.0 lb Date of Birth:  1970-10-19     BSA:          1.69 m Patient Age:    42 years      BP:           115/70 mmHg Patient Gender: F             HR:           70 bpm. Exam Location:  Inpatient Procedure: 2D Echo Indications:    TIA 435.9  History:        Patient has no prior history of Echocardiogram examinations.                 Risk Factors:Hypertension and Diabetes.  Sonographer:    Jannett Celestine RDCS (AE) Referring Phys: [place]  1. Left ventricular ejection fraction, by  visual estimation, is 65 to 70%. The left ventricle has hyperdynamic function. There is no left ventricular hypertrophy.  2. Left ventricular diastolic parameters are indeterminate.  3. The left ventricle has no regional wall motion abnormalities.  4. Global right ventricle has normal systolic function.The right ventricular size is normal. No increase in right ventricular wall thickness.  5. Left atrial size was mildly dilated.  6. Right atrial size was normal.  7. The mitral valve is normal in structure. Trivial mitral valve regurgitation. No evidence of mitral stenosis.  8. The tricuspid valve is normal in structure.  9. The tricuspid valve is normal in structure. Tricuspid valve regurgitation is not demonstrated. 10. The aortic valve has an indeterminant number of cusps. Aortic valve regurgitation is not visualized. No evidence of aortic valve sclerosis or stenosis. 11. The pulmonic valve was not well visualized. Pulmonic valve regurgitation is not visualized. 12. The inferior vena cava is dilated in size with <50% respiratory variability, suggesting right atrial pressure of 15 mmHg. FINDINGS  Left Ventricle: Left ventricular ejection fraction, by visual estimation, is 65 to 70%. The left ventricle has hyperdynamic function. The left ventricle has no regional wall motion abnormalities. There is no left ventricular hypertrophy. Left ventricular diastolic parameters are indeterminate. Right Ventricle: The right ventricular size is normal. No increase in right ventricular wall thickness. Global RV systolic function is has normal systolic function. Left Atrium: Left atrial size was mildly dilated. Right Atrium: Right atrial size was normal in size Pericardium: There is no evidence of pericardial effusion. Mitral Valve: The mitral valve is normal in structure. Trivial mitral valve regurgitation. No evidence of mitral valve stenosis by observation. Tricuspid Valve: The tricuspid valve is normal in structure. Tricuspid  valve regurgitation is not demonstrated. Aortic Valve: The aortic valve has an indeterminant number of cusps. Aortic valve regurgitation is not visualized. The aortic valve is structurally normal, with  no evidence of sclerosis or stenosis. Aortic valve mean gradient measures 3.2 mmHg. Aortic valve peak gradient measures 7.1 mmHg. Aortic valve area, by VTI measures 3.15 cm. Pulmonic Valve: The pulmonic valve was not well visualized. Pulmonic valve regurgitation is not visualized. Pulmonic regurgitation is not visualized. No evidence of pulmonic stenosis. Aorta: The aortic root is normal in size and structure. Venous: The inferior vena cava is dilated in size with less than 50% respiratory variability, suggesting right atrial pressure of 15 mmHg. IAS/Shunts: No atrial level shunt detected by color flow Doppler.  LEFT VENTRICLE PLAX 2D LVIDd:         4.40 cm  Diastology LVIDs:         2.60 cm  LV e' lateral:   6.42 cm/s LV PW:         1.00 cm  LV E/e' lateral: 13.1 LV IVS:        0.70 cm  LV e' medial:    6.74 cm/s LVOT diam:     2.10 cm  LV E/e' medial:  12.5 LV SV:         63 ml LV SV Index:   36.60 LVOT Area:     3.46 cm  RIGHT VENTRICLE RV S prime:     10.90 cm/s TAPSE (M-mode): 2.1 cm LEFT ATRIUM             Index       RIGHT ATRIUM           Index LA diam:        3.50 cm 2.08 cm/m  RA Area:     15.40 cm LA Vol (A2C):   52.3 ml 31.01 ml/m RA Volume:   38.50 ml  22.83 ml/m LA Vol (A4C):   48.6 ml 28.81 ml/m LA Biplane Vol: 50.4 ml 29.88 ml/m  AORTIC VALVE AV Area (Vmax):    2.73 cm AV Area (Vmean):   3.16 cm AV Area (VTI):     3.15 cm AV Vmax:           133.02 cm/s AV Vmean:          82.486 cm/s AV VTI:            0.227 m AV Peak Grad:      7.1 mmHg AV Mean Grad:      3.2 mmHg LVOT Vmax:         105.00 cm/s LVOT Vmean:        75.300 cm/s LVOT VTI:          0.206 m LVOT/AV VTI ratio: 0.91  AORTA Ao Root diam: 2.70 cm MITRAL VALVE MV Area (PHT): 2.80 cm             SHUNTS MV PHT:        78.59 msec            Systemic VTI:  0.21 m MV Decel Time: 271 msec             Systemic Diam: 2.10 cm MV E velocity: 84.00 cm/s 103 cm/s MV A velocity: 58.70 cm/s 70.3 cm/s MV E/A ratio:  1.43       1.5  Carlyle Dolly MD Electronically signed by Carlyle Dolly MD Signature Date/Time: 08/09/2019/1:42:30 PM    Final     Scheduled Meds: . fentaNYL      . gelatin adsorbable      . heparin      . lidocaine      . midazolam      .  amLODipine  5 mg Oral Daily  . carvedilol  6.25 mg Oral BID WC  . Chlorhexidine Gluconate Cloth  6 each Topical Q0600  . gabapentin  100 mg Oral BID  . heparin  5,000 Units Subcutaneous Q8H  . insulin aspart  0-9 Units Subcutaneous TID WC  . insulin aspart protamine- aspart  15 Units Subcutaneous TID AC  . levothyroxine  25 mcg Oral Daily  . pantoprazole  40 mg Oral QHS  . pravastatin  40 mg Oral q1800  . sodium bicarbonate  1,300 mg Oral TID   Continuous Infusions: . ceFAZolin    . sodium chloride    . sodium chloride       LOS: 3 days   Time spent: 29 minutes   Darliss Cheney, MD [facility]  08/11/2019, 11:16 AM   To contact the attending provider between 7A-7P or the covering provider during after hours 7P-7A, please log into the web site [email or web address].

## 2019-08-11 NOTE — Care Management (Signed)
Patient just returned from procedure and exhausted. Will see in morning.   Magdalen Spatz RN

## 2019-08-11 NOTE — Progress Notes (Signed)
IR initially asked to perform kidney biopsy, but is now is need of HD. IR is asked to place tunneled HD cath today and then biopsy tomorrow. Pt already NPO. BP (!) 146/75 (BP Location: Right Arm)   Pulse 65   Temp 97.7 F (36.5 C) (Oral)   Resp 17   Ht 5\' 3"  (1.6 m)   Wt 78 kg   LMP 12/17/2016   SpO2 100%   BMI 30.46 kg/m  Risks and benefits of image guided HD catheter placement was discussed with the patient including, but not limited to bleeding, infection, pneumothorax, or fibrin sheath development and need for additional procedures.  All of the patient's questions were answered, patient is agreeable to proceed. Consent signed and in chart.  Ascencion Dike PA-C Interventional Radiology 08/11/2019 10:39 AM

## 2019-08-11 NOTE — Progress Notes (Signed)
PT Cancellation Note  Patient Details Name: Melanie Hall MRN: [id number] DOB: 1970-08-07   Cancelled Treatment:    Reason Eval/Treat Not Completed: Patient at procedure or test/unavailable. Pt off the floor at IR to place tunneled HD cath for HD. Pt also needing kidney biopsy, per chart, should be done tomorrow. Acute PT to return as able to progress mobility.  Kittie Plater, PT, DPT Acute Rehabilitation Services Pager #: [phone] Office #: [phone]    Berline Lopes 08/11/2019, 10:45 AM

## 2019-08-12 ENCOUNTER — Encounter (HOSPITAL_COMMUNITY): Payer: Self-pay

## 2019-08-12 ENCOUNTER — Ambulatory Visit (HOSPITAL_COMMUNITY): Admission: RE | Admit: 2019-08-12 | Payer: Self-pay | Source: Ambulatory Visit

## 2019-08-12 ENCOUNTER — Inpatient Hospital Stay (HOSPITAL_COMMUNITY): Payer: Medicaid Other

## 2019-08-12 LAB — BASIC METABOLIC PANEL
Anion gap: 21 — ABNORMAL HIGH (ref 5–15)
BUN: 101 mg/dL — ABNORMAL HIGH (ref 6–20)
CO2: 13 mmol/L — ABNORMAL LOW (ref 22–32)
Calcium: 7.8 mg/dL — ABNORMAL LOW (ref 8.9–10.3)
Chloride: 102 mmol/L (ref 98–111)
Creatinine, Ser: 8.08 mg/dL — ABNORMAL HIGH (ref 0.44–1.00)
GFR calc Af Amer: 6 mL/min — ABNORMAL LOW (ref >60)
GFR calc non Af Amer: 5 mL/min — ABNORMAL LOW (ref >60)
Glucose, Bld: 149 mg/dL — ABNORMAL HIGH (ref 70–99)
Potassium: 4.7 mmol/L (ref 3.5–5.1)
Sodium: 136 mmol/L (ref 135–145)

## 2019-08-12 LAB — CBC
HCT: 24.2 % — ABNORMAL LOW (ref 36.0–46.0)
Hemoglobin: 7.8 g/dL — ABNORMAL LOW (ref 12.0–15.0)
MCH: 30.4 pg (ref 26.0–34.0)
MCHC: 32.2 g/dL (ref 30.0–36.0)
MCV: 94.2 fL (ref 80.0–100.0)
Platelets: 173 10*3/uL (ref 150–400)
RBC: 2.57 MIL/uL — ABNORMAL LOW (ref 3.87–5.11)
RDW: 15.8 % — ABNORMAL HIGH (ref 11.5–15.5)
WBC: 7 10*3/uL (ref 4.0–10.5)
nRBC: 0 % (ref 0.0–0.2)

## 2019-08-12 LAB — GLUCOSE, CAPILLARY
Glucose-Capillary: 144 mg/dL — ABNORMAL HIGH (ref 70–99)
Glucose-Capillary: 70 mg/dL (ref 70–99)
Glucose-Capillary: 131 mg/dL — ABNORMAL HIGH (ref 70–99)
Glucose-Capillary: 194 mg/dL — ABNORMAL HIGH (ref 70–99)

## 2019-08-12 IMAGING — US US BIOPSY
1 series · 13 of 13 positions shown · non-contrast
Comparison: None.

INDICATION: Acute on chronic renal insufficiency. Please perform renal biopsy
for tissue diagnostic purposes.

EXAM:
ULTRASOUND GUIDED RENAL BIOPSY

[Series 1: us biopsy · 13 of 13 slices shown]
[im 1/13]
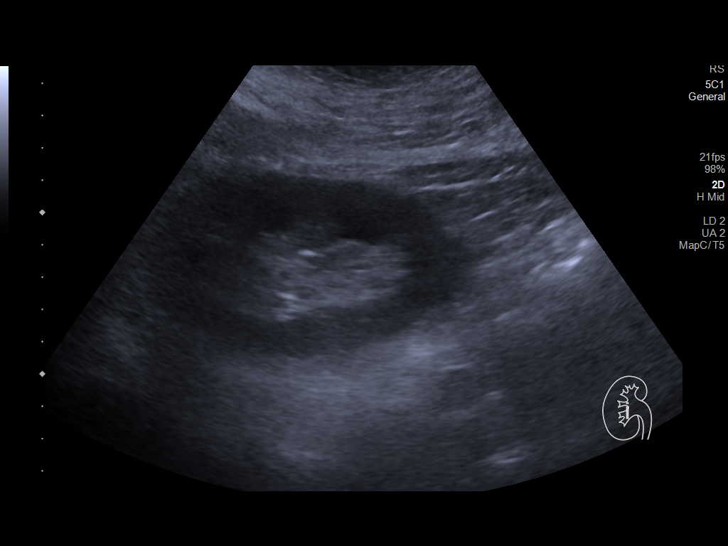
[im 2/13]
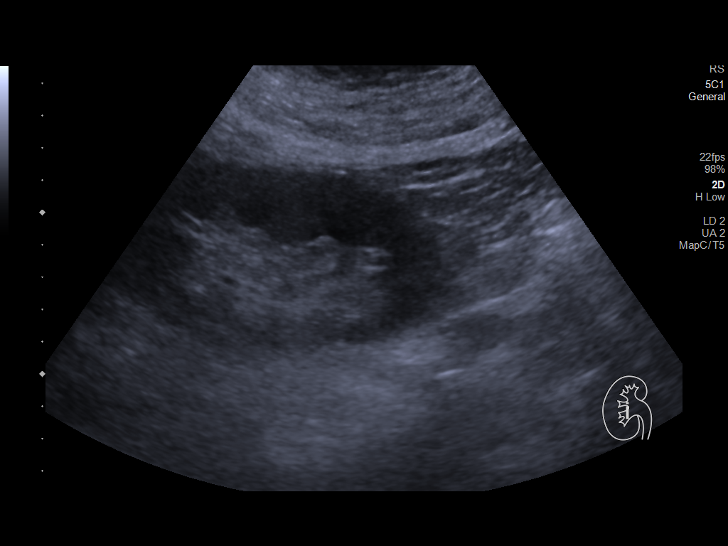
[im 3/13]
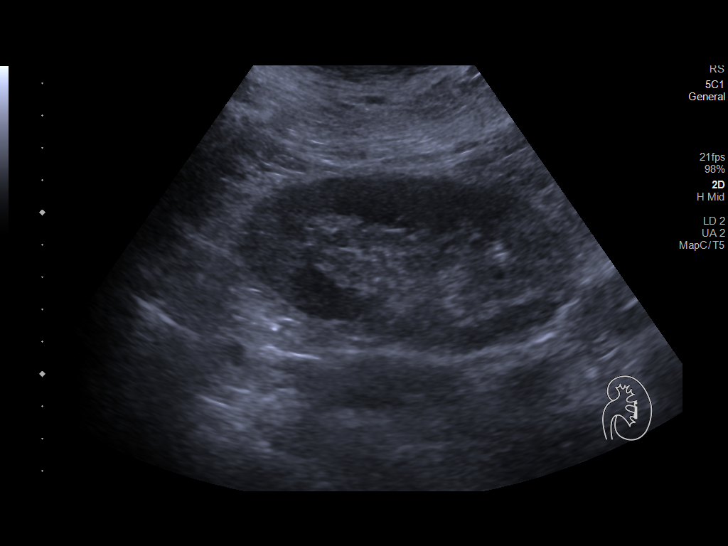
[im 4/13]
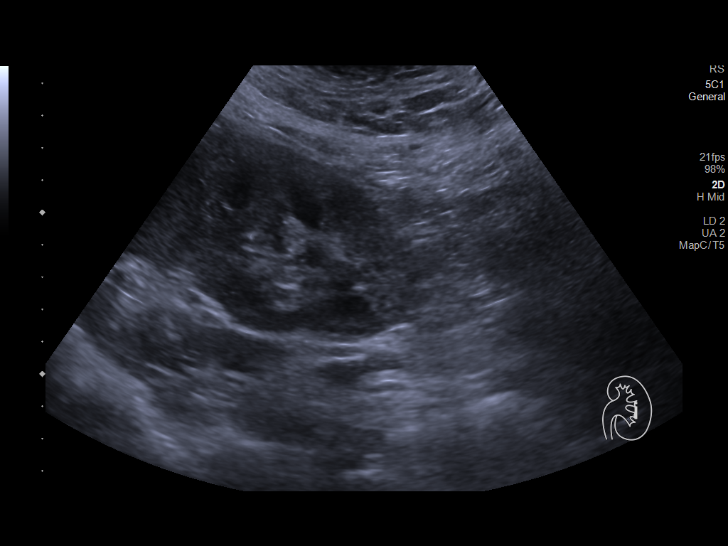
[im 5/13]
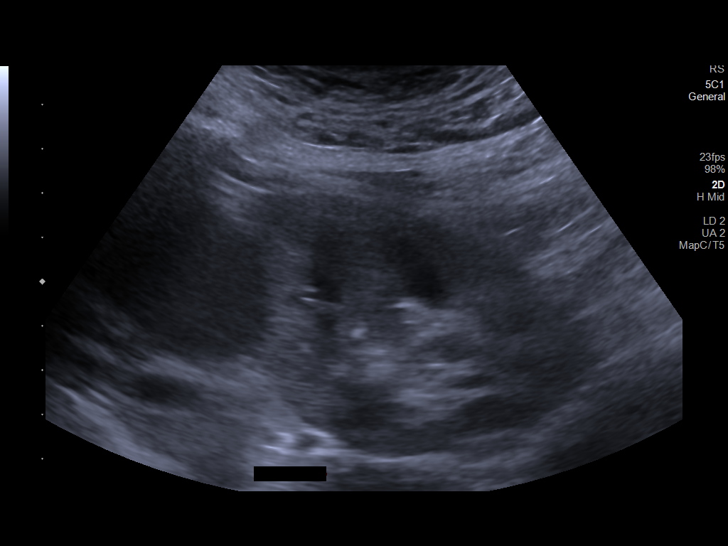
[im 6/13]
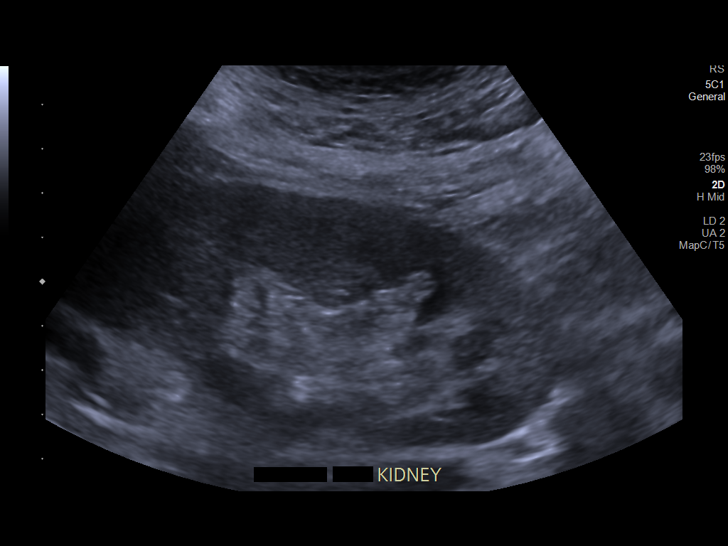
[im 7/13]
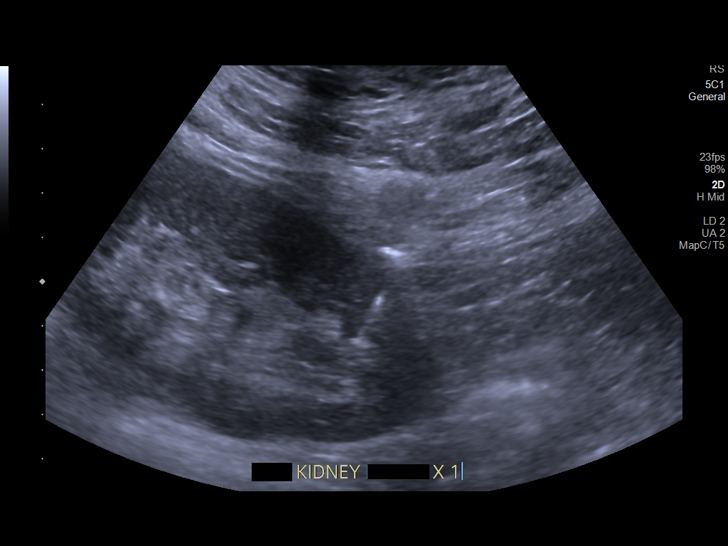
[im 8/13]
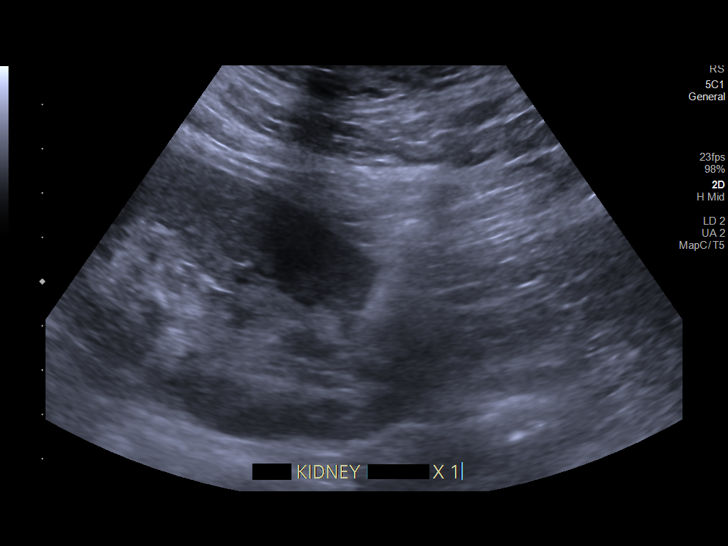
[im 9/13]
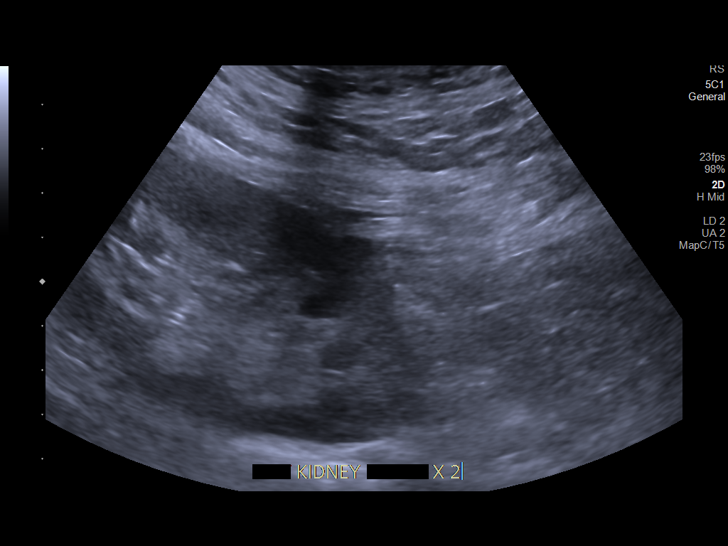
[im 10/13]
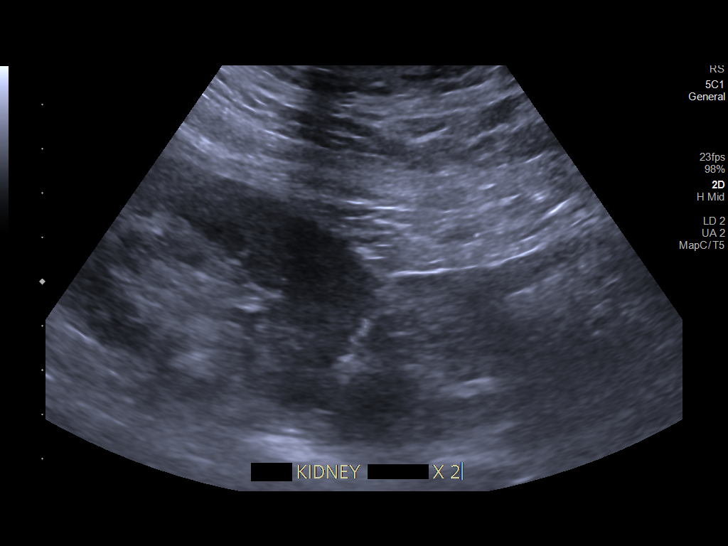
[im 11/13]
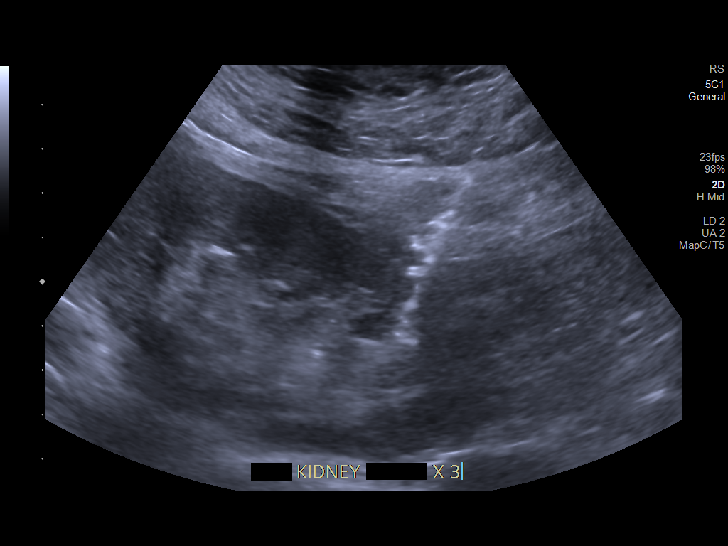
[im 12/13]
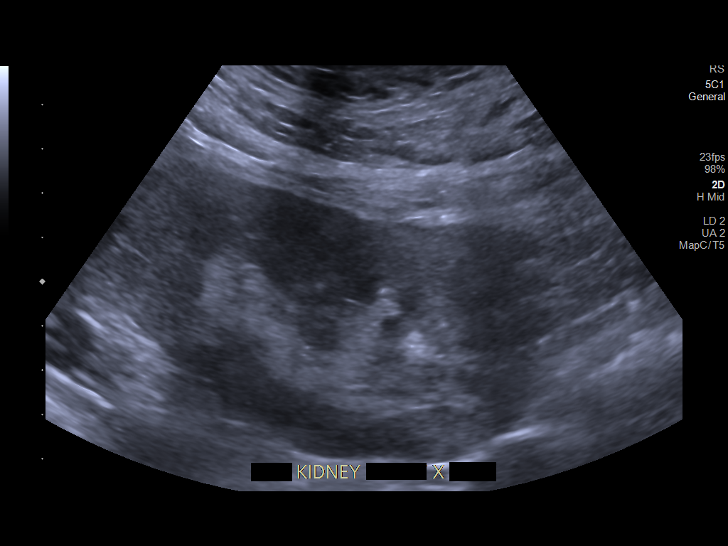
[im 13/13]
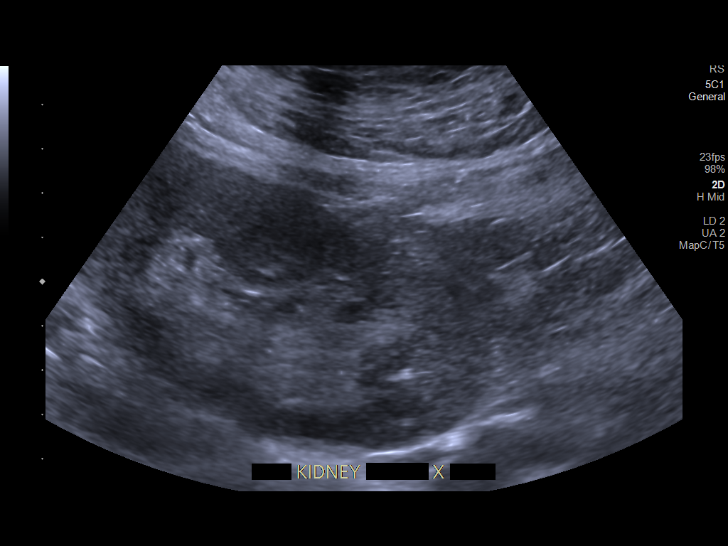

[13 of 13 positions shown; findings below may reference images not displayed]

MEDICATIONS:
None.

ANESTHESIA/SEDATION:
Fentanyl 12.5 mcg IV; Versed 0.5 mg IV

Total Moderate Sedation time: 10 minutes; The patient was
continuously monitored during the procedure by the interventional
radiology nurse under my direct supervision.

COMPLICATIONS:
SIR Level A - No therapy, no consequence.

Procedure complicated by development of a small, non enlarging,
asymptomatic left-sided perinephric hematoma.

Patient complained of left upper extremity numbness prior to the
initiation of the procedure however this was felt to be attributable
to positioning with her arms held above her head. The symptoms were
noted to persist following the procedure and as such, this was
relayed to providing hospitalist, Dr. YOEL, for further
evaluation.

PROCEDURE:
Informed written consent was obtained from the patient after a
discussion of the risks, benefits and alternatives to treatment. The
patient understands and consents the procedure. A timeout was
performed prior to the initiation of the procedure.

Ultrasound scanning was performed of the bilateral flanks. The
inferior pole of the left kidney was selected for biopsy due to
location and sonographic window. The procedure was planned. The
operative site was prepped and draped in the usual sterile fashion.
The overlying soft tissues were anesthetized with 1% lidocaine with
epinephrine. A 17 gauge core needle biopsy device was advanced into
the inferior cortex of the left kidney and 3 core biopsies were
obtained under direct ultrasound guidance. Images were saved for
documentation purposes. The biopsy device was removed and hemostasis
was obtained with manual compression. Post procedural scanning
demonstrated a small non enlarging asymptomatic left-sided
perinephric hematoma. A dressing was placed. The patient tolerated
the procedure well without immediate post procedural complication.
IMPRESSION: Technically successful ultrasound guided left renal biopsy.

## 2019-08-12 MED ORDER — DARBEPOETIN ALFA 100 MCG/0.5ML IJ SOSY: 100.0000 ug | PREFILLED_SYRINGE | INTRAMUSCULAR | Status: DC

## 2019-08-12 MED ORDER — HEPARIN SODIUM (PORCINE) 1000 UNIT/ML IJ SOLN
INTRAMUSCULAR | Status: AC
  Filled 2019-08-12: qty 4

## 2019-08-12 MED ORDER — LIDOCAINE HCL (PF) 1 % IJ SOLN
INTRAMUSCULAR | Status: AC
  Filled 2019-08-12: qty 30

## 2019-08-12 MED ORDER — SODIUM CHLORIDE 0.9 % IV SOLN
125.0000 mg | INTRAVENOUS | Status: DC
  Administered 2019-08-14 – 2019-08-18 (×2): 125 mg via INTRAVENOUS
  Filled 2019-08-12 (×6): qty 10

## 2019-08-12 MED ORDER — FENTANYL CITRATE (PF) 100 MCG/2ML IJ SOLN
INTRAMUSCULAR | Status: AC
  Filled 2019-08-12: qty 2

## 2019-08-12 MED ORDER — FENTANYL CITRATE (PF) 100 MCG/2ML IJ SOLN
INTRAMUSCULAR | Status: AC | PRN
  Administered 2019-08-12: 12.5 ug via INTRAVENOUS

## 2019-08-12 MED ORDER — LIDOCAINE-EPINEPHRINE 1 %-1:100000 IJ SOLN
INTRAMUSCULAR | Status: AC
  Filled 2019-08-12: qty 1

## 2019-08-12 MED ORDER — HEPARIN SODIUM (PORCINE) 5000 UNIT/ML IJ SOLN: 5000.0000 [IU] | Freq: Three times a day (TID) | INTRAMUSCULAR | Status: DC

## 2019-08-12 MED ORDER — SODIUM CHLORIDE 0.9 % IV SOLN
INTRAVENOUS | Status: AC | PRN
  Administered 2019-08-12: 10 mL/h via INTRAVENOUS

## 2019-08-12 MED ORDER — MIDAZOLAM HCL 2 MG/2ML IJ SOLN
INTRAMUSCULAR | Status: AC
  Filled 2019-08-12: qty 2

## 2019-08-12 MED ORDER — MIDAZOLAM HCL 2 MG/2ML IJ SOLN
INTRAMUSCULAR | Status: AC | PRN
  Administered 2019-08-12: 0.5 mg via INTRAVENOUS

## 2019-08-12 MED ORDER — ONDANSETRON HCL 4 MG/2ML IJ SOLN
INTRAMUSCULAR | Status: AC
  Filled 2019-08-12: qty 2

## 2019-08-12 NOTE — Progress Notes (Cosign Needed)
APP Student Note for Education Purposes    [facility] Progress Note    Assessment/ Plan:   1. Rapidly progressive renal failure - w/ nephrotic range proteinuria (11gm nov 2020), hx of IDDM, but progressing rapidly over last 12 mos.  Had +ANA , other serologies negative. Reordered ANA w/ titers--> pending. SPEP slightly + in Nov, then repeat Jan 2021 was negative. Admitted for rising creat 6.7 w/ N/V. Alb 2.5.  She is increasingly uremic and will need dialysis as well as the kidney biopsy.  Discussed with pt this AM.  Willing to accept dialysis catheter and dialysis while we assess reversible causes of renal failure with biopsy.  Have discussed with IR, greatly appreciate all their assistance.  Biopsy requisition form filled out and is in the front of pt's paper chart.  L renal US guided biopsy performed today 2/3, IR TD catheter placed 2/2 for HD, dialysis today after biopsy.    -Cr worse today before HD treatment started, 7.8 today   2. Anemia - Hb 6.2 repleted with prbc x 1 on 1/31. Today Hb 7.7  Iron Panel on 1/31 with Fe of 22. Will start ESA and iron with dialysis today  3. HTN/ vol: stop IVFs  4. Metabolic acidosis, HAGMA -increase Na bicarbonate to 1300 TID -bicarb: 14 --> 12 --> 13 today  5. DM: on insulin, per primary  6. Dispo: pending  Subjective:    BUN/Cr continue to rise and now in need of dialysis.  Pt continues feeling very ill-feeling today and endorses headache, nausea, poor appetite, dry heaving, bad taste in mouth. Denies itching. Patient just returned from renal biopsy procedure upon evaluation and finally had a BM. Renal biopsy today with HD to follow.    Objective:   BP 120/73 (BP Location: Right Arm)   Pulse 65   Temp (!) 97.5 F (36.4 C) (Oral)   Resp 18   Ht 5\' 3"  (1.6 m)   Wt 79.6 kg   LMP 12/17/2016   SpO2 99%   BMI 31.09 kg/m   Intake/Output Summary (Last 24 hours) at 08/12/2019 0800 Last data filed at 08/11/2019 2000 Gross per 24  hour  Intake 75 ml  Output --  Net 75 ml   Weight change: 1.6 kg  Physical Exam: Gen: asleep in bed upon entering room. Patient answered questions without opening eyes. CVS: RRR, no rubs or murmurs  Resp: normal WOB, some muffled bibasilar breath sounds Abd: nontender to palpation Ext: trace LE edema, no asterixis appreciated   Imaging: DG Abd 1 View  Result Date: 08/11/2019 CLINICAL DATA:  Acute abdominal pain EXAM: ABDOMEN - 1 VIEW COMPARISON:  Abdominal CT 11/03/2018 FINDINGS: Normal bowel gas pattern. No concerning mass effect or gas collection. Splenic atherosclerotic calcification. IMPRESSION: Normal bowel gas pattern. Electronically Signed   By: Monte Fantasia M.D.   On: 08/11/2019 04:35   IR Fluoro Guide CV Line Right  Result Date: 08/11/2019 INDICATION: 49 year old with acute on chronic renal failure. Patient needs hemodialysis. EXAM: FLUOROSCOPIC AND ULTRASOUND GUIDED PLACEMENT OF A TUNNELED DIALYSIS CATHETER Physician: Stephan Minister. Anselm Pancoast, MD MEDICATIONS: Ancef 2 g; The antibiotic was administered within an appropriate time interval prior to skin puncture. ANESTHESIA/SEDATION: Versed 1.0 mg IV; Fentanyl 50 mcg IV; Moderate Sedation Time:  27 minutes The patient was continuously monitored during the procedure by the interventional radiology nurse under my direct supervision. FLUOROSCOPY TIME:  Fluoroscopy Time: 24 seconds, 1 mGy COMPLICATIONS: None immediate. PROCEDURE: The procedure was explained to the  patient. The risks and benefits of the procedure were discussed and the patient's questions were addressed. Informed consent was obtained from the patient. The patient was placed supine on the interventional table. Ultrasound confirmed a patent right internal jugular vein. Ultrasound images were obtained for documentation. The right neck and chest was prepped and draped in a sterile fashion. The right neck was anesthetized with 1% lidocaine. Maximal barrier sterile technique was utilized  including caps, mask, sterile gowns, sterile gloves, sterile drape, hand hygiene and skin antiseptic. A small incision was made with #11 blade scalpel. A 21 gauge needle directed into the right internal jugular vein with ultrasound guidance. A micropuncture dilator set was placed. A 19 cm tip to cuff Palindrome catheter was selected. The skin below the right clavicle was anesthetized and a small incision was made with an #11 blade scalpel. A subcutaneous tunnel was formed to the vein dermatotomy site. The catheter was brought through the tunnel. The vein dermatotomy site was dilated to accommodate a peel-away sheath. The catheter was placed through the peel-away sheath and directed into the central venous structures. The tip of the catheter was placed at the SVC and right atrium junction with fluoroscopy. Fluoroscopic images were obtained for documentation. Both lumens were found to aspirate and flush well. The proper amount of heparin was flushed in both lumens. The vein dermatotomy site was closed using a single layer of absorbable suture and Dermabond. Gel-Foam placed in the subcutaneous tract. The catheter was secured to the skin using Prolene suture. IMPRESSION: Successful placement of a right jugular tunneled dialysis catheter using ultrasound and fluoroscopic guidance. Electronically Signed   By: Markus Daft M.D.   On: 08/11/2019 13:10   IR US Guide Vasc Access Right  Result Date: 08/11/2019 INDICATION: 49 year old with acute on chronic renal failure. Patient needs hemodialysis. EXAM: FLUOROSCOPIC AND ULTRASOUND GUIDED PLACEMENT OF A TUNNELED DIALYSIS CATHETER Physician: Stephan Minister. Anselm Pancoast, MD MEDICATIONS: Ancef 2 g; The antibiotic was administered within an appropriate time interval prior to skin puncture. ANESTHESIA/SEDATION: Versed 1.0 mg IV; Fentanyl 50 mcg IV; Moderate Sedation Time:  27 minutes The patient was continuously monitored during the procedure by the interventional radiology nurse under my direct  supervision. FLUOROSCOPY TIME:  Fluoroscopy Time: 24 seconds, 1 mGy COMPLICATIONS: None immediate. PROCEDURE: The procedure was explained to the patient. The risks and benefits of the procedure were discussed and the patient's questions were addressed. Informed consent was obtained from the patient. The patient was placed supine on the interventional table. Ultrasound confirmed a patent right internal jugular vein. Ultrasound images were obtained for documentation. The right neck and chest was prepped and draped in a sterile fashion. The right neck was anesthetized with 1% lidocaine. Maximal barrier sterile technique was utilized including caps, mask, sterile gowns, sterile gloves, sterile drape, hand hygiene and skin antiseptic. A small incision was made with #11 blade scalpel. A 21 gauge needle directed into the right internal jugular vein with ultrasound guidance. A micropuncture dilator set was placed. A 19 cm tip to cuff Palindrome catheter was selected. The skin below the right clavicle was anesthetized and a small incision was made with an #11 blade scalpel. A subcutaneous tunnel was formed to the vein dermatotomy site. The catheter was brought through the tunnel. The vein dermatotomy site was dilated to accommodate a peel-away sheath. The catheter was placed through the peel-away sheath and directed into the central venous structures. The tip of the catheter was placed at the SVC and right atrium junction  with fluoroscopy. Fluoroscopic images were obtained for documentation. Both lumens were found to aspirate and flush well. The proper amount of heparin was flushed in both lumens. The vein dermatotomy site was closed using a single layer of absorbable suture and Dermabond. Gel-Foam placed in the subcutaneous tract. The catheter was secured to the skin using Prolene suture. IMPRESSION: Successful placement of a right jugular tunneled dialysis catheter using ultrasound and fluoroscopic guidance. Electronically  Signed   By: Markus Daft M.D.   On: 08/11/2019 13:10    Labs: BMET Recent Labs  Lab 08/08/19 1607 08/09/19 0829 08/10/19 0635 08/11/19 0522 08/12/19 0705  NA 133* 136 136 133* 136  K 4.2 4.0 4.5 4.2 4.7  CL 106 107 106 106 102  CO2 16* 15* 14* 12* 13*  GLUCOSE 263* 170* 103* 158* 149*  BUN 73* 73* 83* 96* 101*  CREATININE 6.40* 6.76* 7.00* 7.54* 8.08*  CALCIUM 8.1* 7.7* 7.7* 7.3* 7.8*   CBC Recent Labs  Lab 08/08/19 1607 08/08/19 1607 08/09/19 0829 08/09/19 1317 08/09/19 2321 08/10/19 0635 08/11/19 0522 08/12/19 0705  WBC 11.7*   < > 8.0  --   --  8.1 7.1 7.0  NEUTROABS 8.8*  --   --   --   --   --   --   --   HGB 7.5*   < > 6.2*   < > 8.0* 7.3* 7.7* 7.8*  HCT 22.9*   < > 18.4*   < > 23.3* 22.3* 23.3* 24.2*  MCV 94.6   < > 92.0  --   --  91.8 91.0 94.2  PLT 193   < > 138*  --   --  182 174 173   < > = values in this interval not displayed.    Medications:    . amLODipine  5 mg Oral Daily  . carvedilol  6.25 mg Oral BID WC  . Chlorhexidine Gluconate Cloth  6 each Topical Q0600  . fentaNYL      . gabapentin  100 mg Oral BID  . insulin aspart  0-9 Units Subcutaneous TID WC  . insulin aspart protamine- aspart  15 Units Subcutaneous TID AC  . levothyroxine  25 mcg Oral Daily  . lidocaine-EPINEPHrine      . midazolam      . pantoprazole  40 mg Oral QHS  . pravastatin  40 mg Oral q1800  . sodium bicarbonate  1,300 mg Oral TID      Harrell Gave, PA Student 08/12/2019, 8:00 AM

## 2019-08-12 NOTE — Progress Notes (Addendum)
OT Cancellation Note  Patient Details Name: Melanie Hall MRN: [id number] DOB: Nov 20, 1970   Cancelled Treatment:    Reason Eval/Treat Not Completed: Fatigue/lethargy limiting ability to participate;Other (comment). Pt getting ready to try to eat lunch; RN reports pt is fatigued and had n/v earlier, will be going to HD sometime today and requests OT to return later. Will check back later as time allows  Britt Bottom 08/12/2019, 11:40 AM

## 2019-08-12 NOTE — Progress Notes (Signed)
[facility] Progress Note    Assessment/ Plan:   1. Rapidly progressive renal failure - w/ nephrotic range proteinuria (11gm nov 2020), hx of IDDM, but progressing rapidly over last 12 mos.  Had +ANA , other serologies negative. Reordered ANA w/ titers--> pending. SPEP slightly + in Nov, then repeat Jan 2021 was negative. Admitted for rising creat 6.7 w/ N/V. Alb 2.5.  She is increasingly uremic and needs dialysis as well as the kidney biopsy.  S/p TDC 2/2, s/p renal biopsy 2/3 and HD#1 2/3.       2. Anemia - Hb 6.2, sig issue. Getting prbc x 1.  Will start ESA and iron with dialysis--> darbepoetin 100 mcg q Wed and Ferrlicet to start Friday  3. HTN/ vol: stop IVFs  4. Metabolic acidosis: increase bicarb to 1300 TID, expect to improve on HD  5. DM: on insulin, per primary  6. Dispo: pending  Subjective:    Got TDC yesterday, s/p renal biopsy today.  Having n/v, getting HD now.   Objective:   BP 110/60 (BP Location: Right Arm)   Pulse 61   Temp (!) 97.3 F (36.3 C) (Oral)   Resp 18   Ht 5\' 3"  (1.6 m)   Wt 80.6 kg   LMP 12/17/2016   SpO2 95%   BMI 31.48 kg/m   Intake/Output Summary (Last 24 hours) at 08/12/2019 1350 Last data filed at 08/11/2019 2000 Gross per 24 hour  Intake 75 ml  Output --  Net 75 ml   Weight change: 1.6 kg  Physical Exam: Gen: lying in bed, sleeping, NAD CVS: RRR Resp: normal WOB, some muffled bibasilar breath sounds Abd: nontender Ext: trace LE edema ACCESS: R IJ TDC  Imaging: DG Abd 1 View  Result Date: 08/11/2019 CLINICAL DATA:  Acute abdominal pain EXAM: ABDOMEN - 1 VIEW COMPARISON:  Abdominal CT 11/03/2018 FINDINGS: Normal bowel gas pattern. No concerning mass effect or gas collection. Splenic atherosclerotic calcification. IMPRESSION: Normal bowel gas pattern. Electronically Signed   By: Monte Fantasia M.D.   On: 08/11/2019 04:35   IR Fluoro Guide CV Line Right  Result Date: 08/11/2019 INDICATION: 49 year old with acute on  chronic renal failure. Patient needs hemodialysis. EXAM: FLUOROSCOPIC AND ULTRASOUND GUIDED PLACEMENT OF A TUNNELED DIALYSIS CATHETER Physician: Stephan Minister. Anselm Pancoast, MD MEDICATIONS: Ancef 2 g; The antibiotic was administered within an appropriate time interval prior to skin puncture. ANESTHESIA/SEDATION: Versed 1.0 mg IV; Fentanyl 50 mcg IV; Moderate Sedation Time:  27 minutes The patient was continuously monitored during the procedure by the interventional radiology nurse under my direct supervision. FLUOROSCOPY TIME:  Fluoroscopy Time: 24 seconds, 1 mGy COMPLICATIONS: None immediate. PROCEDURE: The procedure was explained to the patient. The risks and benefits of the procedure were discussed and the patient's questions were addressed. Informed consent was obtained from the patient. The patient was placed supine on the interventional table. Ultrasound confirmed a patent right internal jugular vein. Ultrasound images were obtained for documentation. The right neck and chest was prepped and draped in a sterile fashion. The right neck was anesthetized with 1% lidocaine. Maximal barrier sterile technique was utilized including caps, mask, sterile gowns, sterile gloves, sterile drape, hand hygiene and skin antiseptic. A small incision was made with #11 blade scalpel. A 21 gauge needle directed into the right internal jugular vein with ultrasound guidance. A micropuncture dilator set was placed. A 19 cm tip to cuff Palindrome catheter was selected. The skin below the right clavicle was anesthetized and a small  incision was made with an #11 blade scalpel. A subcutaneous tunnel was formed to the vein dermatotomy site. The catheter was brought through the tunnel. The vein dermatotomy site was dilated to accommodate a peel-away sheath. The catheter was placed through the peel-away sheath and directed into the central venous structures. The tip of the catheter was placed at the SVC and right atrium junction with fluoroscopy.  Fluoroscopic images were obtained for documentation. Both lumens were found to aspirate and flush well. The proper amount of heparin was flushed in both lumens. The vein dermatotomy site was closed using a single layer of absorbable suture and Dermabond. Gel-Foam placed in the subcutaneous tract. The catheter was secured to the skin using Prolene suture. IMPRESSION: Successful placement of a right jugular tunneled dialysis catheter using ultrasound and fluoroscopic guidance. Electronically Signed   By: Markus Daft M.D.   On: 08/11/2019 13:10   IR US Guide Vasc Access Right  Result Date: 08/11/2019 INDICATION: 49 year old with acute on chronic renal failure. Patient needs hemodialysis. EXAM: FLUOROSCOPIC AND ULTRASOUND GUIDED PLACEMENT OF A TUNNELED DIALYSIS CATHETER Physician: Stephan Minister. Anselm Pancoast, MD MEDICATIONS: Ancef 2 g; The antibiotic was administered within an appropriate time interval prior to skin puncture. ANESTHESIA/SEDATION: Versed 1.0 mg IV; Fentanyl 50 mcg IV; Moderate Sedation Time:  27 minutes The patient was continuously monitored during the procedure by the interventional radiology nurse under my direct supervision. FLUOROSCOPY TIME:  Fluoroscopy Time: 24 seconds, 1 mGy COMPLICATIONS: None immediate. PROCEDURE: The procedure was explained to the patient. The risks and benefits of the procedure were discussed and the patient's questions were addressed. Informed consent was obtained from the patient. The patient was placed supine on the interventional table. Ultrasound confirmed a patent right internal jugular vein. Ultrasound images were obtained for documentation. The right neck and chest was prepped and draped in a sterile fashion. The right neck was anesthetized with 1% lidocaine. Maximal barrier sterile technique was utilized including caps, mask, sterile gowns, sterile gloves, sterile drape, hand hygiene and skin antiseptic. A small incision was made with #11 blade scalpel. A 21 gauge needle directed  into the right internal jugular vein with ultrasound guidance. A micropuncture dilator set was placed. A 19 cm tip to cuff Palindrome catheter was selected. The skin below the right clavicle was anesthetized and a small incision was made with an #11 blade scalpel. A subcutaneous tunnel was formed to the vein dermatotomy site. The catheter was brought through the tunnel. The vein dermatotomy site was dilated to accommodate a peel-away sheath. The catheter was placed through the peel-away sheath and directed into the central venous structures. The tip of the catheter was placed at the SVC and right atrium junction with fluoroscopy. Fluoroscopic images were obtained for documentation. Both lumens were found to aspirate and flush well. The proper amount of heparin was flushed in both lumens. The vein dermatotomy site was closed using a single layer of absorbable suture and Dermabond. Gel-Foam placed in the subcutaneous tract. The catheter was secured to the skin using Prolene suture. IMPRESSION: Successful placement of a right jugular tunneled dialysis catheter using ultrasound and fluoroscopic guidance. Electronically Signed   By: Markus Daft M.D.   On: 08/11/2019 13:10   US BIOPSY (KIDNEY)  Result Date: 08/12/2019 INDICATION: Acute on chronic renal insufficiency. Please perform renal biopsy for tissue diagnostic purposes. EXAM: ULTRASOUND GUIDED RENAL BIOPSY COMPARISON:  None. MEDICATIONS: None. ANESTHESIA/SEDATION: Fentanyl 12.5 mcg IV; Versed 0.5 mg IV Total Moderate Sedation time: 10 minutes; The patient  was continuously monitored during the procedure by the interventional radiology nurse under my direct supervision. COMPLICATIONS: SIR Level A - No therapy, no consequence. Procedure complicated by development of a small, non enlarging, asymptomatic left-sided perinephric hematoma. Patient complained of left upper extremity numbness prior to the initiation of the procedure however this was felt to be attributable  to positioning with her arms held above her head. The symptoms were noted to persist following the procedure and as such, this was relayed to providing hospitalist, Dr. Doristine Bosworth, for further evaluation. PROCEDURE: Informed written consent was obtained from the patient after a discussion of the risks, benefits and alternatives to treatment. The patient understands and consents the procedure. A timeout was performed prior to the initiation of the procedure. Ultrasound scanning was performed of the bilateral flanks. The inferior pole of the left kidney was selected for biopsy due to location and sonographic window. The procedure was planned. The operative site was prepped and draped in the usual sterile fashion. The overlying soft tissues were anesthetized with 1% lidocaine with epinephrine. A 17 gauge core needle biopsy device was advanced into the inferior cortex of the left kidney and 3 core biopsies were obtained under direct ultrasound guidance. Images were saved for documentation purposes. The biopsy device was removed and hemostasis was obtained with manual compression. Post procedural scanning demonstrated a small non enlarging asymptomatic left-sided perinephric hematoma. A dressing was placed. The patient tolerated the procedure well without immediate post procedural complication. IMPRESSION: Technically successful ultrasound guided left renal biopsy. Electronically Signed   By: Sandi Mariscal M.D.   On: 08/12/2019 09:13    Labs: BMET Recent Labs  Lab 08/08/19 1607 08/09/19 0829 08/10/19 0635 08/11/19 0522 08/12/19 0705  NA 133* 136 136 133* 136  K 4.2 4.0 4.5 4.2 4.7  CL 106 107 106 106 102  CO2 16* 15* 14* 12* 13*  GLUCOSE 263* 170* 103* 158* 149*  BUN 73* 73* 83* 96* 101*  CREATININE 6.40* 6.76* 7.00* 7.54* 8.08*  CALCIUM 8.1* 7.7* 7.7* 7.3* 7.8*   CBC Recent Labs  Lab 08/08/19 1607 08/08/19 1607 08/09/19 0829 08/09/19 1317 08/09/19 2321 08/10/19 0635 08/11/19 0522 08/12/19 0705   WBC 11.7*   < > 8.0  --   --  8.1 7.1 7.0  NEUTROABS 8.8*  --   --   --   --   --   --   --   HGB 7.5*   < > 6.2*   < > 8.0* 7.3* 7.7* 7.8*  HCT 22.9*   < > 18.4*   < > 23.3* 22.3* 23.3* 24.2*  MCV 94.6   < > 92.0  --   --  91.8 91.0 94.2  PLT 193   < > 138*  --   --  182 174 173   < > = values in this interval not displayed.    Medications:    . amLODipine  5 mg Oral Daily  . carvedilol  6.25 mg Oral BID WC  . Chlorhexidine Gluconate Cloth  6 each Topical Q0600  . fentaNYL      . gabapentin  100 mg Oral BID  . insulin aspart  0-9 Units Subcutaneous TID WC  . insulin aspart protamine- aspart  15 Units Subcutaneous TID AC  . levothyroxine  25 mcg Oral Daily  . lidocaine-EPINEPHrine      . midazolam      . pantoprazole  40 mg Oral QHS  . pravastatin  40 mg Oral q1800  .  sodium bicarbonate  1,300 mg Oral TID      Madelon Lips MD 08/12/2019, 1:50 PM

## 2019-08-12 NOTE — Progress Notes (Signed)
Physical Therapy Treatment Patient Details Name: Melanie Hall MRN: [id number] DOB: 1971/03/18 Today's Date: 08/12/2019    History of Present Illness 49 y.o. female with medical history significant of chronic kidney disease stage V, hypertension, hyperlipidemia, diabetes mellitus type 2 who presented to the ER with intermittent chest pain, uncontrolled hypertension. She complains of a gradual onset diffuse headache and R arm weakness. Pt also with hx of monoclonal gammopathy.    PT Comments    Pt very limited this session by stool incontinence, nausea/vomiting, and fatigue. Pt agreeable to bed-level activity only, so PT assisted pt in pericare and rolling, repositioning in bed, and LE exercises. Pt limited in amount of LE exercises due to weakness and fatigue, but PT educated pt on the importance of performing them (SLRs, heel slides, hip abd/add) in bed to maintain strength and for circulation. Pt agrees. PT to continue to follow acutely, and will progress mobility as tolerated. Pt still awaiting dialysis.    Follow Up Recommendations  Home health PT;Supervision - Intermittent     Equipment Recommendations  None recommended by PT(pt owns necessary DME)    Recommendations for Other Services       Precautions / Restrictions Precautions Precautions: Fall Restrictions Weight Bearing Restrictions: No    Mobility  Bed Mobility Overal bed mobility: Needs Assistance Bed Mobility: Rolling Rolling: Min assist         General bed mobility comments: min assist for rolling bilaterally for completion of roll onto side for pericare (pt with BM in bed). Very taxing for pt this session, with complaints of nausea and recent emesis.  Transfers Overall transfer level: Needs assistance Equipment used: Rolling walker (2 wheeled)             General transfer comment: pt declines, due to fatigue and just being up to bathroom  Ambulation/Gait             General Gait Details: pt  unable   Stairs             Wheelchair Mobility    Modified Rankin (Stroke Patients Only)       Balance Overall balance assessment: Needs assistance     Sitting balance - Comments: unable to assess this session       Standing balance comment: unable to assess this session                            Cognition Arousal/Alertness: Awake/alert Behavior During Therapy: WFL for tasks assessed/performed Overall Cognitive Status: Within Functional Limits for tasks assessed                                        Exercises General Exercises - Lower Extremity Hip ABduction/ADduction: AROM;Both;5 reps;Supine Straight Leg Raises: AAROM;Both;5 reps;Supine    General Comments        Pertinent Vitals/Pain Pain Assessment: Faces Faces Pain Scale: Hurts even more Pain Location: headache, abdomen Pain Descriptors / Indicators: Discomfort;Grimacing Pain Intervention(s): Limited activity within patient's tolerance;Monitored during session;Repositioned    Home Living                      Prior Function            PT Goals (current goals can now be found in the care plan section) Acute Rehab PT Goals Patient Stated Goal: to go  home PT Goal Formulation: With patient Time For Goal Achievement: 08/23/19 Potential to Achieve Goals: Good Progress towards PT goals: Not progressing toward goals - comment(limited by N/V, fatigue)    Frequency    Min 3X/week      PT Plan Current plan remains appropriate    Co-evaluation              AM-PAC PT "6 Clicks" Mobility   Outcome Measure  Help needed turning from your back to your side while in a flat bed without using bedrails?: A Little Help needed moving from lying on your back to sitting on the side of a flat bed without using bedrails?: A Little Help needed moving to and from a bed to a chair (including a wheelchair)?: A Lot Help needed standing up from a chair using your arms  (Hall.g., wheelchair or bedside chair)?: A Lot Help needed to walk in hospital room?: A Lot Help needed climbing 3-5 steps with a railing? : Total 6 Click Score: 13    End of Session Equipment Utilized During Treatment: (none) Activity Tolerance: Patient limited by fatigue;Treatment limited secondary to medical complications (Comment)(N/V) Patient left: in bed;with call bell/phone within reach;with bed alarm set Nurse Communication: Mobility status PT Visit Diagnosis: Unsteadiness on feet (R26.81);Muscle weakness (generalized) (M62.81)     Time: 1010-1026 PT Time Calculation (min) (ACUTE ONLY): 16 min  Charges:  $Therapeutic Activity: 8-22 mins                     Melanie Hall, PT Acute Rehabilitation Services Pager [phone]  Office [phone]   Melanie Hall 08/12/2019, 11:44 AM

## 2019-08-12 NOTE — Procedures (Signed)
Pre Procedure Dx: Renal insufficiency Post Procedural Dx: Same  Technically successful US guided biopsy of inferior pole of the left kidney.  EBL: Minimal. Complications: Small post biopsy perinephric hematoma.  Ronny Bacon, MD Pager #: [phone]

## 2019-08-12 NOTE — Progress Notes (Signed)
Floor Rn Kathyrn Drown informed me that patient is still waiting on renal biopsy to be completed at this time before she is to start HD Tx.

## 2019-08-12 NOTE — Progress Notes (Signed)
OT Cancellation Note  Patient Details Name: Melanie Hall MRN: [id number] DOB: 04-Dec-1970   Cancelled Treatment:    Reason Eval/Treat Not Completed: Patient at procedure or test/ unavailable. Pt at HD, will try back next available date/time  Britt Bottom 08/12/2019, 1:59 PM

## 2019-08-12 NOTE — Sedation Documentation (Signed)
Went with transporter to take patient to [facility]. RN Tammy in room with patient prior to leaving.

## 2019-08-12 NOTE — Progress Notes (Signed)
PROGRESS NOTE    Melanie Hall  IDP:[id number] DOB: Nov 14, 1970 DOA: 08/08/2019 PCP: [facility] [place]   Brief Narrative:  Melanie Hall is a 49 y.o. female with medical history significant of chronic kidney disease stage V, hypertension, hyperlipidemia, diabetes mellitus type 2 who presented to the [facility] with intermittent chest pain, uncontrolled hypertension. Patient follows with nephrology as outpatient and was supposed to get an Epogen shot but was told she cannot get it as her blood pressure is over 443 systolic.  She complained of a gradual onset diffuse headache as well as right arm weakness which is been there for the past 2 weeks. She also has intermittent chest pain on the right side of her chest that comes and goes lasting for several minutes to hours at a time.here are some associated shortness of breath.  No PND.  No fever, chills, cough.  Upon arrival to ED, she was hemodynamically stable.  All the rest of the labs were fairly stable except her creatinine was 6.4, slightly worse from 5.19 on 07/23/2019.  Due to strokelike symptoms, she ended up having CT of the head which was unremarkable and subsequently she was transferred to [facility] in order to get MRI.  MRI was unremarkable.  Patient then started having worsening renal function.  Nephrology was consulted.  She started having uremic symptoms.  Nephrology consulted IR.  Patient underwent tunneled HD catheter on 08/11/2019 and then had renal biopsy today.  Assessment & Plan:   Principal Problem:   Acute on chronic renal failure (HCC) Active Problems:   Type 2 diabetes with nephropathy (HCC)   HYPERCHOLESTEROLEMIA   GERD (gastroesophageal reflux disease)   MGUS (monoclonal gammopathy of unknown significance)   Accelerated hypertension   Chest pain   Right arm weakness   Right arm weakness/headache: Patient had negative CT head as well as MRI of the brain.  Patient today started having some right forearm "  numbness" after renal biopsy.  I was informed about this by radiologist.  Radiologist also told me that patients are asked to keep their arms up while the biopsy is done and patients often have numbness because of this.  Patient in one side complaints of numbness but on the other side she tells me that she can feel the touch so there is no sensory loss.  She also complains of some tingling.  This raises suspicion for possible cervical stenosis/nerve impingement.  She will need more work-up and perhaps CT or MRI of the cervical spine at some point in time.  For now, we will watch for symptoms that she has no other focal symptoms or any positive sign on the neuro exam.  Acute kidney injury on chronic kidney disease stage IV/progressive chronic kidney disease/uremia: Creatinine continues to get worse.  She came in with 6.40 >6.76>7.0> 7.54> 8.08.  She has signs of rapidly progressive chronic kidney disease.  Status post HD tunneled catheter placement on 08/11/2019 and now renal biopsy today.  She is going to get dialysis today.  Appreciate nephrology help and defer further management to them.  Diarrhea: Patient complains of having intermittent diarrhea for the past 3 months.  No mention of diarrhea in H&P.  Not sure if she ever mention to anybody else. She has not had any bowel movement in last 2 to 3 days. C. difficile, GI pathogen and ova and parasite were ordered but were never collected due to her not having any bowel movement.  Precautions will be discontinued today.  Acute on chronic anemia of chronic disease: Hemoglobin dropped from 7.5-6.2. She received 1 unit of PRBC transfusion on 2019-08-09. Posttransfusion hemoglobin 8.0 and then dropped back to 7.3> 7.7 . Iron studies indicate anemia of chronic disease. FOBT pending.  Acute on chronic low back pain: Patient complains of low back pain which is actually chronic but has been worse now.  Also complains of bilateral hip pain.  She also told me that she  sometimes cannot feel when she has to pass stool and she has incontinence.  Denied any saddle anesthesia or any lower extremity weakness. Due to concern of spinal stenosis, CT lumbar spine was obtained which showed L1 compression fracture is stable from the skeletal survey earlier this month. 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 And there is posttraumatic or degenerative gas in the adjacent L1-L2 disc. No significant retropulsion of bone or complicating features.  Uncontrolled essential hypertension: Blood pressure mostly controlled but fluctuates. Continue home dose of amlodipine and carvedilol.  Type 2 diabetes mellitus: Takes NovoLog 70/30 15 units 3 times daily at home.  Blood sugar fairly controlled with some elevation here and there.  Continue current regimen along with SSI.  Hyperlipidemia: Continue lovastatin.  Chronic bilateral pleural effusion: No hypoxia.  Monitor.  Chronic nonanion gap metabolic acidosis: Worsening, likely secondary to worsening renal function.  Nephrology on board.  She is currently a dialysis.  Hopefully that will improve acidosis.  Monoclonal gammopathy of undetermined significance: Patient is being followed by Dr. Delton Coombes from oncology service as outpatient. Recently noted to have nephrotic range proteinuria. Also found to have M spike of 0.2 g. Light chain ratio is mildly elevated at 2.36.  Plan was to have a skeletal survey and a repeat of myeloma labs including immunofixation.  Hypothyroidism: Continue Synthroid.  GERD: Continue PPI  Migraine headache: Resolved.  DVT prophylaxis: We will hold heparin for 24 hours now that she has had renal biopsy.  Placed on SCD. Code Status: Full code Family Communication:  None present at bedside.  Plan of care discussed with patient in length and he verbalized understanding and agreed with it. Patient came from: Home Disposition Plan: Will be discharged home with home  health once improved and cleared by nephrology. Barriers to discharge: Medical improvement and need for dialysis.  Estimated body mass index is 31.09 kg/m as calculated from the following:   Height as of this encounter: 5\' 3"  (1.6 m).   Weight as of this encounter: 79.6 kg.      Nutritional status:               Consultants:   Nephrology/IR  Procedures:   Tunneled HD catheter placement on 08/11/2019  Renal biopsy done on 08/12/2019  Antimicrobials:   None   Subjective: Seen and examined after she had renal biopsy.  Complains of right forearm numbness but she also tells me that she is able to feel the touch.  No other complaint.  Objective: Vitals:   08/12/19 0835 08/12/19 0840 08/12/19 0850 08/12/19 0911  BP: 125/75 104/70 121/70 105/79  Pulse: 67 65 76 63  Resp: (!) 22 (!) 29 (!) 24 18  Temp:    (!) 97.3 F (36.3 C)  TempSrc:    Oral  SpO2: 100% 100% 99% 99%  Weight:      Height:        Intake/Output Summary (Last 24 hours) at 08/12/2019 2993 Last data filed at 08/11/2019 2000 Gross per 24 hour  Intake 75  ml  Output --  Net 75 ml   Filed Weights   08/08/19 1532 08/11/19 0626 08/12/19 0604  Weight: 65.8 kg 78 kg 79.6 kg    Examination:  General exam: Appears calm and comfortable  Respiratory system: Clear to auscultation. Respiratory effort normal. Cardiovascular system: S1 & S2 heard, RRR. No JVD, murmurs, rubs, gallops or clicks. No pedal edema. Gastrointestinal system: Abdomen is nondistended, soft and nontender. No organomegaly or masses felt. Normal bowel sounds heard. Central nervous system: Alert and oriented. No focal neurological deficits. Extremities: Symmetric 5 x 5 power. Skin: No rashes, lesions or ulcers.  Psychiatry: Judgement and insight appear poor, mood & affect flat.    Data Reviewed: I have personally reviewed following labs and imaging studies  CBC: Recent Labs  Lab 08/08/19 1607 08/08/19 1607 08/09/19 0829  08/09/19 1317 08/09/19 2321 08/10/19 0635 08/11/19 0522 08/12/19 0705  WBC 11.7*  --  8.0  --   --  8.1 7.1 7.0  NEUTROABS 8.8*  --   --   --   --   --   --   --   HGB 7.5*   < > 6.2*  --  8.0* 7.3* 7.7* 7.8*  HCT 22.9*   < > 18.4* 18.4* 23.3* 22.3* 23.3* 24.2*  MCV 94.6  --  92.0  --   --  91.8 91.0 94.2  PLT 193  --  138*  --   --  182 174 173   < > = values in this interval not displayed.   Basic Metabolic Panel: Recent Labs  Lab 08/08/19 1607 08/09/19 0829 08/10/19 0635 08/11/19 0522 08/12/19 0705  NA 133* 136 136 133* 136  K 4.2 4.0 4.5 4.2 4.7  CL 106 107 106 106 102  CO2 16* 15* 14* 12* 13*  GLUCOSE 263* 170* 103* 158* 149*  BUN 73* 73* 83* 96* 101*  CREATININE 6.40* 6.76* 7.00* 7.54* 8.08*  CALCIUM 8.1* 7.7* 7.7* 7.3* 7.8*  MG  --  1.7  --   --   --    GFR: Estimated Creatinine Clearance: 8.4 mL/min (A) (by C-G formula based on SCr of 8.08 mg/dL (H)). Liver Function Tests: Recent Labs  Lab 08/08/19 1607  AST 52*  ALT 47*  ALKPHOS 82  BILITOT 1.0  PROT 6.4*  ALBUMIN 2.5*   Recent Labs  Lab 08/08/19 1607  LIPASE 17   No results for input(s): AMMONIA in the last 168 hours. Coagulation Profile: Recent Labs  Lab 08/11/19 0522  INR 1.2   Cardiac Enzymes: No results for input(s): CKTOTAL, CKMB, CKMBINDEX, TROPONINI in the last 168 hours. BNP (last 3 results) No results for input(s): PROBNP in the last 8760 hours. HbA1C: No results for input(s): HGBA1C in the last 72 hours. CBG: Recent Labs  Lab 08/11/19 0622 08/11/19 1307 08/11/19 1615 08/11/19 2127 08/12/19 0612  GLUCAP 134* 132* 171* 177* 144*   Lipid Profile: No results for input(s): CHOL, HDL, LDLCALC, TRIG, CHOLHDL, LDLDIRECT in the last 72 hours. Thyroid Function Tests: No results for input(s): TSH, T4TOTAL, FREET4, T3FREE, THYROIDAB in the last 72 hours. Anemia Panel: Recent Labs    08/09/19 1317  VITAMINB12 830  FERRITIN 303  TIBC 203*  IRON 22*   Sepsis Labs: No results  for input(s): PROCALCITON, LATICACIDVEN in the last 168 hours.  Recent Results (from the past 240 hour(s))  Respiratory Panel by RT PCR (Flu A&B, Covid) - Nasopharyngeal Swab     Status: None   Collection Time:  08/08/19  5:50 PM   Specimen: Nasopharyngeal Swab  Result Value Ref Range Status   SARS Coronavirus 2 by RT PCR NEGATIVE NEGATIVE Final    Comment: (NOTE) SARS-CoV-2 target nucleic acids are NOT DETECTED. The SARS-CoV-2 RNA is generally detectable in upper respiratoy specimens during the acute phase of infection. The lowest concentration of SARS-CoV-2 viral copies this assay can detect is 131 copies/mL. A negative result does not preclude SARS-Cov-2 infection and should not be used as the sole basis for treatment or other patient management decisions. A negative result may occur with  improper specimen collection/handling, submission of specimen other than nasopharyngeal swab, presence of viral mutation(s) within the areas targeted by this assay, and inadequate number of viral copies (<131 copies/mL). A negative result must be combined with clinical observations, patient history, and epidemiological information. The expected result is Negative. Fact Sheet for Patients:  PinkCheek.be Fact Sheet for Healthcare Providers:  GravelBags.it This test is not yet ap proved or cleared by the [place] FDA and  has been authorized for detection and/or diagnosis of SARS-CoV-2 by FDA under an Emergency Use Authorization (EUA). This EUA will remain  in effect (meaning this test can be used) for the duration of the COVID-19 declaration under Section 564(b)(1) of the Act, 21 U.S.C. section 360bbb-3(b)(1), unless the authorization is terminated or revoked sooner.    Influenza A by PCR NEGATIVE NEGATIVE Final   Influenza B by PCR NEGATIVE NEGATIVE Final    Comment: (NOTE) The Xpert Xpress SARS-CoV-2/FLU/RSV assay is intended as an  aid in  the diagnosis of influenza from Nasopharyngeal swab specimens and  should not be used as a sole basis for treatment. Nasal washings and  aspirates are unacceptable for Xpert Xpress SARS-CoV-2/FLU/RSV  testing. Fact Sheet for Patients: PinkCheek.be Fact Sheet for Healthcare Providers: GravelBags.it This test is not yet approved or cleared by the [place] FDA and  has been authorized for detection and/or diagnosis of SARS-CoV-2 by  FDA under an Emergency Use Authorization (EUA). This EUA will remain  in effect (meaning this test can be used) for the duration of the  Covid-19 declaration under Section 564(b)(1) of the Act, 21  U.S.C. section 360bbb-3(b)(1), unless the authorization is  terminated or revoked. Performed at [facility], [address], [place], [place] [address]       Radiology Studies: DG Abd 1 View  Result Date: 08/11/2019 CLINICAL DATA:  Acute abdominal pain EXAM: ABDOMEN - 1 VIEW COMPARISON:  Abdominal CT 11/03/2018 FINDINGS: Normal bowel gas pattern. No concerning mass effect or gas collection. Splenic atherosclerotic calcification. IMPRESSION: Normal bowel gas pattern. Electronically Signed   By: Monte Fantasia M.D.   On: 08/11/2019 04:35   IR Fluoro Guide CV Line Right  Result Date: 08/11/2019 INDICATION: 49 year old with acute on chronic renal failure. Patient needs hemodialysis. EXAM: FLUOROSCOPIC AND ULTRASOUND GUIDED PLACEMENT OF A TUNNELED DIALYSIS CATHETER Physician: Stephan Minister. Anselm Pancoast, MD MEDICATIONS: Ancef 2 g; The antibiotic was administered within an appropriate time interval prior to skin puncture. ANESTHESIA/SEDATION: Versed 1.0 mg IV; Fentanyl 50 mcg IV; Moderate Sedation Time:  27 minutes The patient was continuously monitored during the procedure by the interventional radiology nurse under my direct supervision. FLUOROSCOPY TIME:  Fluoroscopy Time: 24 seconds, 1 mGy COMPLICATIONS: None  immediate. PROCEDURE: The procedure was explained to the patient. The risks and benefits of the procedure were discussed and the patient's questions were addressed. Informed consent was obtained from the patient. The patient was placed supine on the  interventional table. Ultrasound confirmed a patent right internal jugular vein. Ultrasound images were obtained for documentation. The right neck and chest was prepped and draped in a sterile fashion. The right neck was anesthetized with 1% lidocaine. Maximal barrier sterile technique was utilized including caps, mask, sterile gowns, sterile gloves, sterile drape, hand hygiene and skin antiseptic. A small incision was made with #11 blade scalpel. A 21 gauge needle directed into the right internal jugular vein with ultrasound guidance. A micropuncture dilator set was placed. A 19 cm tip to cuff Palindrome catheter was selected. The skin below the right clavicle was anesthetized and a small incision was made with an #11 blade scalpel. A subcutaneous tunnel was formed to the vein dermatotomy site. The catheter was brought through the tunnel. The vein dermatotomy site was dilated to accommodate a peel-away sheath. The catheter was placed through the peel-away sheath and directed into the central venous structures. The tip of the catheter was placed at the SVC and right atrium junction with fluoroscopy. Fluoroscopic images were obtained for documentation. Both lumens were found to aspirate and flush well. The proper amount of heparin was flushed in both lumens. The vein dermatotomy site was closed using a single layer of absorbable suture and Dermabond. Gel-Foam placed in the subcutaneous tract. The catheter was secured to the skin using Prolene suture. IMPRESSION: Successful placement of a right jugular tunneled dialysis catheter using ultrasound and fluoroscopic guidance. Electronically Signed   By: Markus Daft M.D.   On: 08/11/2019 13:10   IR US Guide Vasc Access  Right  Result Date: 08/11/2019 INDICATION: 49 year old with acute on chronic renal failure. Patient needs hemodialysis. EXAM: FLUOROSCOPIC AND ULTRASOUND GUIDED PLACEMENT OF A TUNNELED DIALYSIS CATHETER Physician: Stephan Minister. Anselm Pancoast, MD MEDICATIONS: Ancef 2 g; The antibiotic was administered within an appropriate time interval prior to skin puncture. ANESTHESIA/SEDATION: Versed 1.0 mg IV; Fentanyl 50 mcg IV; Moderate Sedation Time:  27 minutes The patient was continuously monitored during the procedure by the interventional radiology nurse under my direct supervision. FLUOROSCOPY TIME:  Fluoroscopy Time: 24 seconds, 1 mGy COMPLICATIONS: None immediate. PROCEDURE: The procedure was explained to the patient. The risks and benefits of the procedure were discussed and the patient's questions were addressed. Informed consent was obtained from the patient. The patient was placed supine on the interventional table. Ultrasound confirmed a patent right internal jugular vein. Ultrasound images were obtained for documentation. The right neck and chest was prepped and draped in a sterile fashion. The right neck was anesthetized with 1% lidocaine. Maximal barrier sterile technique was utilized including caps, mask, sterile gowns, sterile gloves, sterile drape, hand hygiene and skin antiseptic. A small incision was made with #11 blade scalpel. A 21 gauge needle directed into the right internal jugular vein with ultrasound guidance. A micropuncture dilator set was placed. A 19 cm tip to cuff Palindrome catheter was selected. The skin below the right clavicle was anesthetized and a small incision was made with an #11 blade scalpel. A subcutaneous tunnel was formed to the vein dermatotomy site. The catheter was brought through the tunnel. The vein dermatotomy site was dilated to accommodate a peel-away sheath. The catheter was placed through the peel-away sheath and directed into the central venous structures. The tip of the catheter was  placed at the SVC and right atrium junction with fluoroscopy. Fluoroscopic images were obtained for documentation. Both lumens were found to aspirate and flush well. The proper amount of heparin was flushed in both lumens. The vein dermatotomy  site was closed using a single layer of absorbable suture and Dermabond. Gel-Foam placed in the subcutaneous tract. The catheter was secured to the skin using Prolene suture. IMPRESSION: Successful placement of a right jugular tunneled dialysis catheter using ultrasound and fluoroscopic guidance. Electronically Signed   By: Markus Daft M.D.   On: 08/11/2019 13:10   US BIOPSY (KIDNEY)  Result Date: 08/12/2019 INDICATION: Acute on chronic renal insufficiency. Please perform renal biopsy for tissue diagnostic purposes. EXAM: ULTRASOUND GUIDED RENAL BIOPSY COMPARISON:  None. MEDICATIONS: None. ANESTHESIA/SEDATION: Fentanyl 12.5 mcg IV; Versed 0.5 mg IV Total Moderate Sedation time: 10 minutes; The patient was continuously monitored during the procedure by the interventional radiology nurse under my direct supervision. COMPLICATIONS: SIR Level A - No therapy, no consequence. Procedure complicated by development of a small, non enlarging, asymptomatic left-sided perinephric hematoma. Patient complained of left upper extremity numbness prior to the initiation of the procedure however this was felt to be attributable to positioning with her arms held above her head. The symptoms were noted to persist following the procedure and as such, this was relayed to providing hospitalist, Dr. Doristine Bosworth, for further evaluation. PROCEDURE: Informed written consent was obtained from the patient after a discussion of the risks, benefits and alternatives to treatment. The patient understands and consents the procedure. A timeout was performed prior to the initiation of the procedure. Ultrasound scanning was performed of the bilateral flanks. The inferior pole of the left kidney was selected for  biopsy due to location and sonographic window. The procedure was planned. The operative site was prepped and draped in the usual sterile fashion. The overlying soft tissues were anesthetized with 1% lidocaine with epinephrine. A 17 gauge core needle biopsy device was advanced into the inferior cortex of the left kidney and 3 core biopsies were obtained under direct ultrasound guidance. Images were saved for documentation purposes. The biopsy device was removed and hemostasis was obtained with manual compression. Post procedural scanning demonstrated a small non enlarging asymptomatic left-sided perinephric hematoma. A dressing was placed. The patient tolerated the procedure well without immediate post procedural complication. IMPRESSION: Technically successful ultrasound guided left renal biopsy. Electronically Signed   By: Sandi Mariscal M.D.   On: 08/12/2019 09:13    Scheduled Meds: . amLODipine  5 mg Oral Daily  . carvedilol  6.25 mg Oral BID WC  . Chlorhexidine Gluconate Cloth  6 each Topical Q0600  . fentaNYL      . gabapentin  100 mg Oral BID  . heparin  5,000 Units Subcutaneous Q8H  . insulin aspart  0-9 Units Subcutaneous TID WC  . insulin aspart protamine- aspart  15 Units Subcutaneous TID AC  . levothyroxine  25 mcg Oral Daily  . lidocaine-EPINEPHrine      . midazolam      . ondansetron      . pantoprazole  40 mg Oral QHS  . pravastatin  40 mg Oral q1800  . sodium bicarbonate  1,300 mg Oral TID   Continuous Infusions: . sodium chloride    . sodium chloride       LOS: 4 days   Time spent: 31 minutes   Darliss Cheney, MD [facility]  08/12/2019, 9:38 AM   To contact the attending provider between 7A-7P or the covering provider during after hours 7P-7A, please log into the web site [email or web address].

## 2019-08-12 NOTE — Sedation Documentation (Signed)
Patient started complaining of numb right arm at 0836, able to move arm. Felt due to having arm over head during procedure. Still numb, states cannot feel after being turned supine post procedure. Smile is same bilateral, able to move both arms well and hand grips equal. No arm drift when asked to hold arms out. Able to move legs well with equal strength bilateral. Pupils equal and reactive to light. States cannot feel arm at all but grimaced and did feel light pinch of arm. Stated has not had numbness before but was told a few days ago may have had mini stroke as right arm was painful. Dr Pascal Lux notified at [phone]. Her VS are stable. His recommendation was to let her nurse on [facility] know and have her have the hospitalist see her. He checked her head CT scan from 2 days ago and it was negative.

## 2019-08-12 NOTE — Sedation Documentation (Signed)
Gave report to Tammy RN on [facility]. Aware re right arm numbness and that Dr Pascal Lux was aware re this. Let her know to have the hospitalist come and see her.

## 2019-08-13 ENCOUNTER — Inpatient Hospital Stay (HOSPITAL_COMMUNITY): Payer: Medicaid Other

## 2019-08-13 ENCOUNTER — Encounter (HOSPITAL_COMMUNITY): Payer: Self-pay

## 2019-08-13 DIAGNOSIS — N186 End stage renal disease: Secondary | ICD-10-CM

## 2019-08-13 LAB — GLUCOSE, CAPILLARY
Glucose-Capillary: 108 mg/dL — ABNORMAL HIGH (ref 70–99)
Glucose-Capillary: 86 mg/dL (ref 70–99)
Glucose-Capillary: 184 mg/dL — ABNORMAL HIGH (ref 70–99)
Glucose-Capillary: 122 mg/dL — ABNORMAL HIGH (ref 70–99)

## 2019-08-13 LAB — RENAL FUNCTION PANEL
Albumin: 1.9 g/dL — ABNORMAL LOW (ref 3.5–5.0)
Anion gap: 14 (ref 5–15)
BUN: 77 mg/dL — ABNORMAL HIGH (ref 6–20)
CO2: 18 mmol/L — ABNORMAL LOW (ref 22–32)
Calcium: 7.6 mg/dL — ABNORMAL LOW (ref 8.9–10.3)
Chloride: 104 mmol/L (ref 98–111)
Creatinine, Ser: 6.9 mg/dL — ABNORMAL HIGH (ref 0.44–1.00)
GFR calc Af Amer: 7 mL/min — ABNORMAL LOW (ref >60)
GFR calc non Af Amer: 6 mL/min — ABNORMAL LOW (ref >60)
Glucose, Bld: 125 mg/dL — ABNORMAL HIGH (ref 70–99)
Phosphorus: 7 mg/dL — ABNORMAL HIGH (ref 2.5–4.6)
Potassium: 4 mmol/L (ref 3.5–5.1)
Sodium: 136 mmol/L (ref 135–145)

## 2019-08-13 LAB — CBC
HCT: 20.6 % — ABNORMAL LOW (ref 36.0–46.0)
Hemoglobin: 6.6 g/dL — CL (ref 12.0–15.0)
MCH: 30.4 pg (ref 26.0–34.0)
MCHC: 32 g/dL (ref 30.0–36.0)
MCV: 94.9 fL (ref 80.0–100.0)
Platelets: 171 10*3/uL (ref 150–400)
RBC: 2.17 MIL/uL — ABNORMAL LOW (ref 3.87–5.11)
RDW: 15.6 % — ABNORMAL HIGH (ref 11.5–15.5)
WBC: 7 10*3/uL (ref 4.0–10.5)
nRBC: 0.3 % — ABNORMAL HIGH (ref 0.0–0.2)

## 2019-08-13 LAB — HEPATITIS B CORE ANTIBODY, TOTAL: Hep B Core Total Ab: NONREACTIVE

## 2019-08-13 LAB — PREPARE RBC (CROSSMATCH)

## 2019-08-13 IMAGING — CT CT CERVICAL SPINE W/O CM
3 of 4 series · 13 of 35 positions shown, 16 images · non-contrast
Comparison: None.

CLINICAL DATA: Spinal stenosis.

EXAM:
CT CERVICAL SPINE WITHOUT CONTRAST
TECHNIQUE: Multidetector CT imaging of the cervical spine was performed without
intravenous contrast. Multiplanar CT image reconstructions were also
generated.

[Series 4: c_spine 2.0 bone · axial · 0.25mm/px · z∈[-256,-136]mm · 5 of 92 slices shown, 7 images]
[im 16/92  soft-tissue]
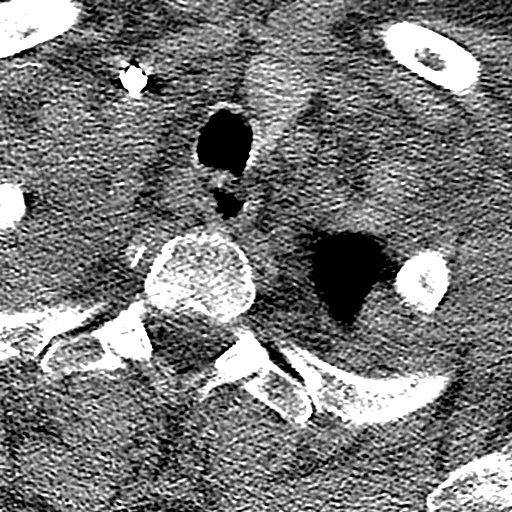
[im 16/92  bone]
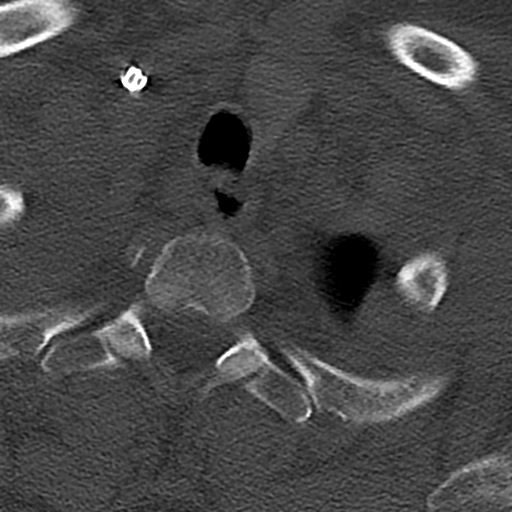
[im 31/92  bone]
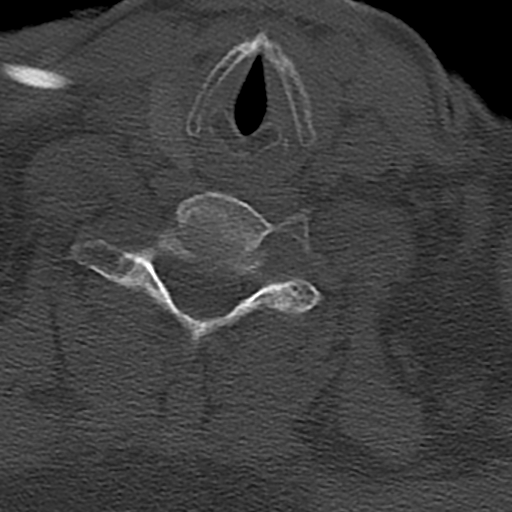
[im 46/92  bone]
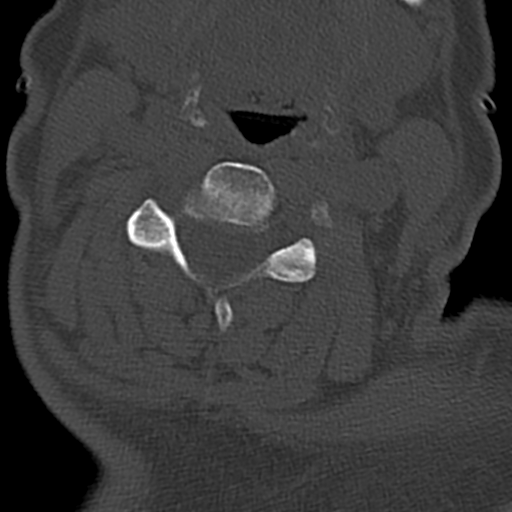
[im 61/92  bone]
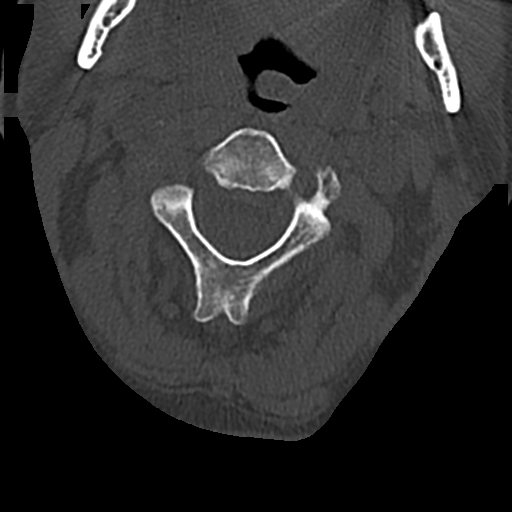
[im 76/92  soft-tissue]
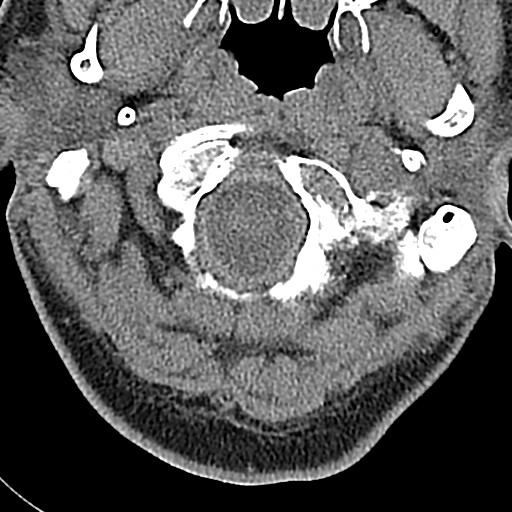
[im 76/92  bone]
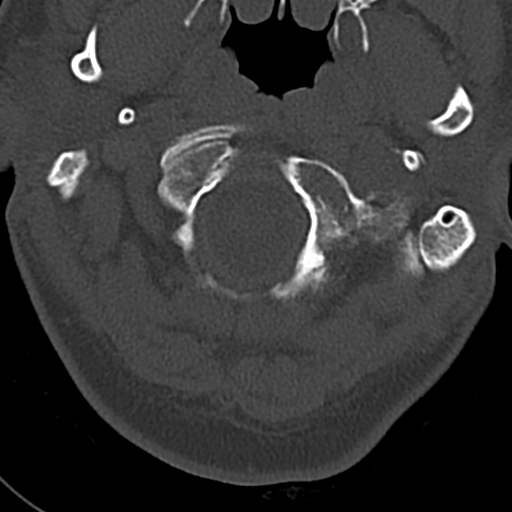

[Series 6: coronal bone · coronal · 0.27mm/px · 3 of 61 slices shown]
[im 13/61  bone]
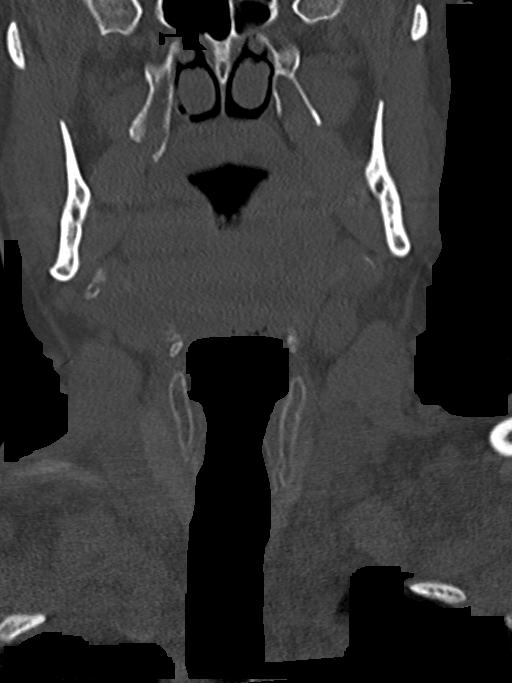
[im 25/61  bone]
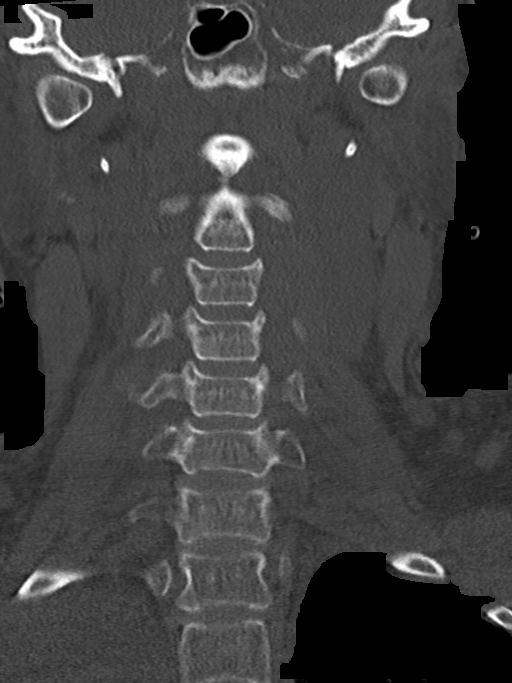
[im 37/61  bone]
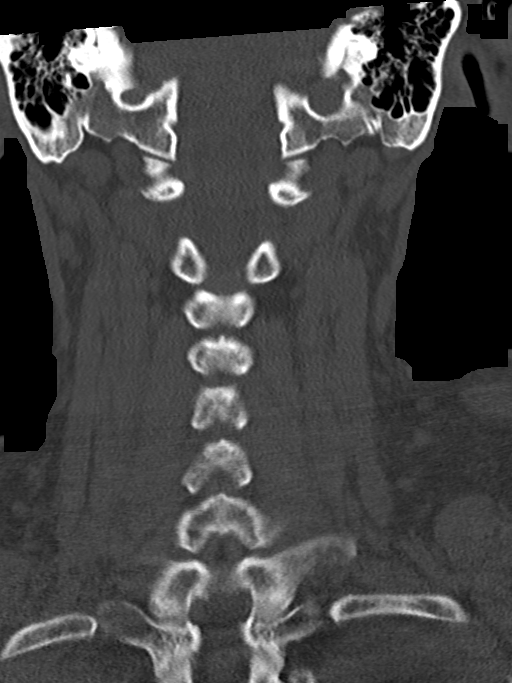

[Series 7: sagittal bone · sagittal · 0.27mm/px · 5 of 61 slices shown, 6 images]
[im 21/61  bone]
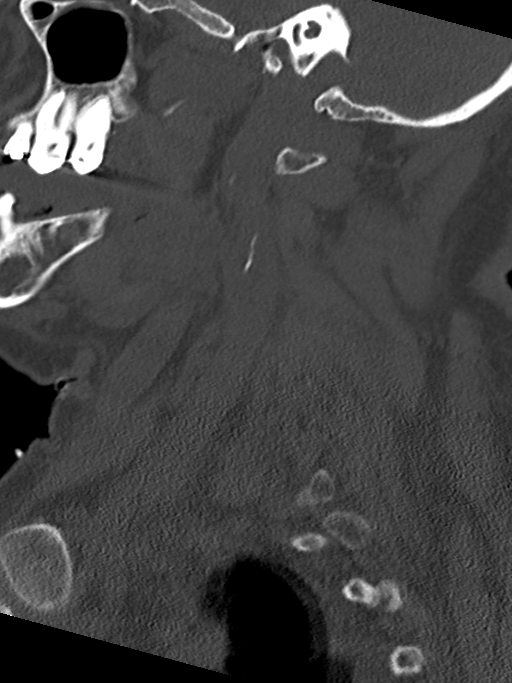
[im 26/61  bone]
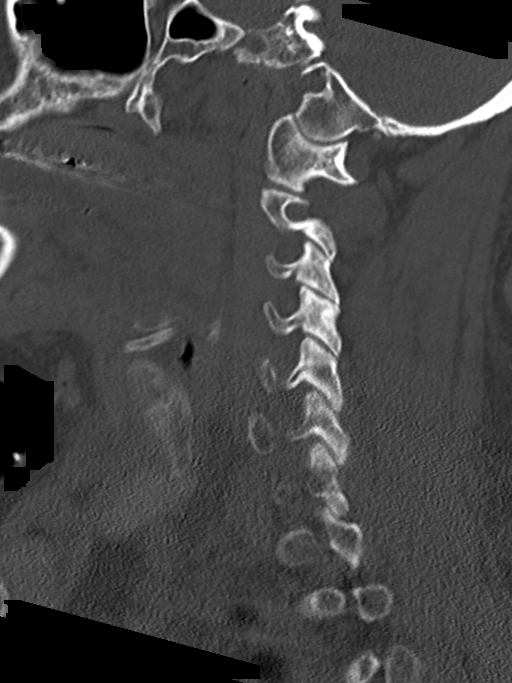
[im 31/61  soft-tissue]
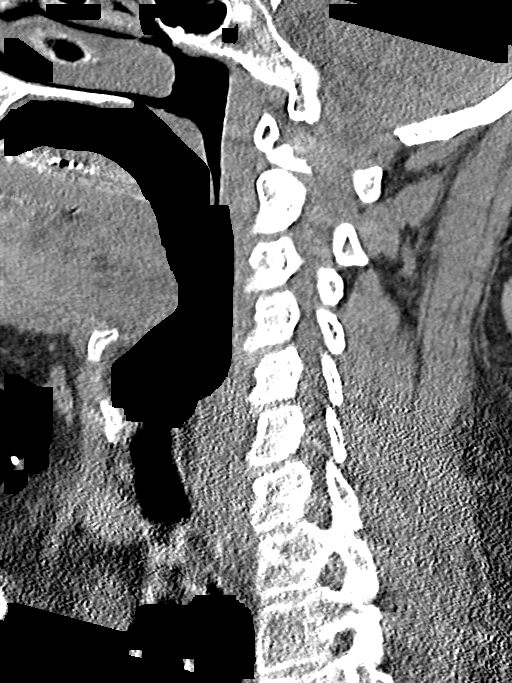
[im 31/61  bone]
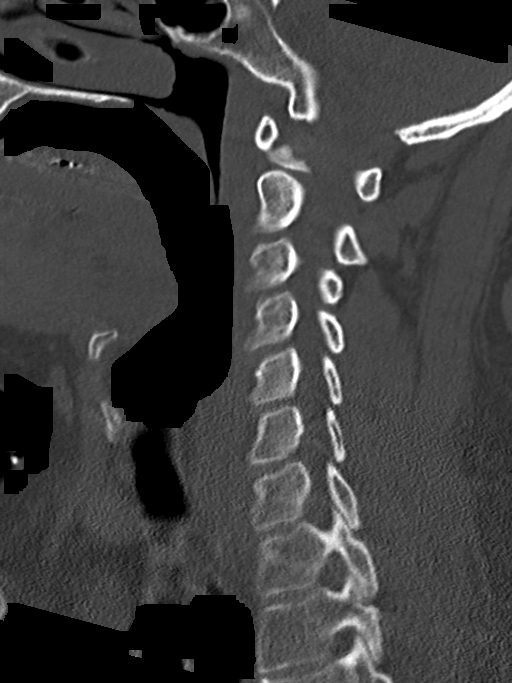
[im 36/61  bone]
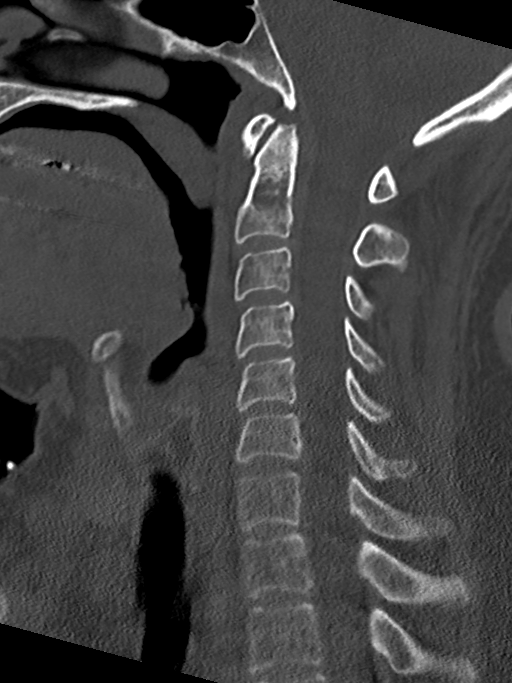
[im 41/61  bone]
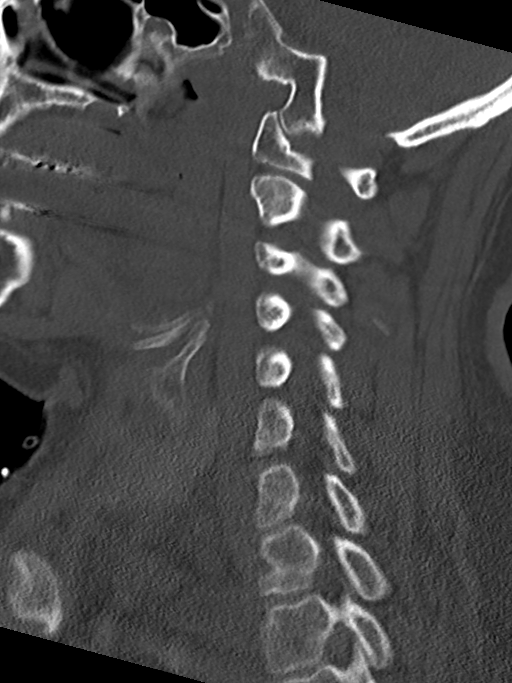

[13 of 35 positions shown; findings below may reference images not displayed]

FINDINGS: Alignment: Normal

Skull base and vertebrae: Negative for fracture or mass.

Soft tissues and spinal canal: Spinal canal normal in size. No
significant spinal stenosis.

Disc levels: Disc spaces well maintained without significant disc
space narrowing. Small central disc protrusions at C3-4 and C4-5
without significant stenosis of the canal. No significant foraminal
encroachment.

Upper chest: Bilateral pleural effusions right greater than left.
Visualized lung in the apices is clear bilaterally.

Other: None
IMPRESSION: Negative for spinal stenosis. No acute abnormality in the cervical
spine. Mild degenerative changes.

Bilateral pleural effusions, right greater than left.

## 2019-08-13 IMAGING — CT CT ABD-PELV W/O CM
2 of 3 series · 15 of 46 positions shown, 17 images · non-contrast
Comparison: Ultrasound-guided left-sided random renal
biopsy-[DATE]; CT abdomen and pelvis-[DATE]; [DATE];
lumbar spine CT -[DATE]

CLINICAL DATA: Evaluate for retroperitoneal hematoma. Recent
left-sided ultrasound-guided random renal biopsy.

EXAM:
CT ABDOMEN AND PELVIS WITHOUT CONTRAST
TECHNIQUE: Multidetector CT imaging of the abdomen and pelvis was performed
following the standard protocol without IV contrast.

[Series 12: abd/ pelvis 5.0 i30f 2 · axial · 0.75mm/px · z∈[-808,-403]mm · 12 of 95 slices shown, 14 images]
[im 7/95  soft-tissue]
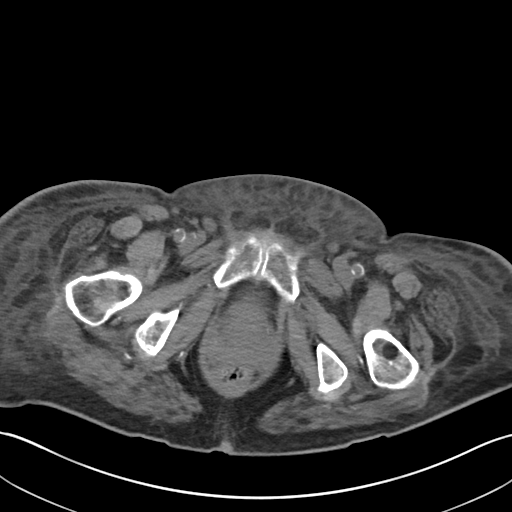
[im 7/95  bone]
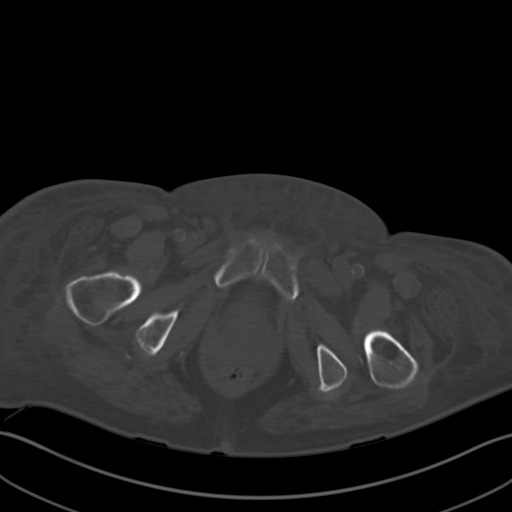
[im 13/95  soft-tissue]
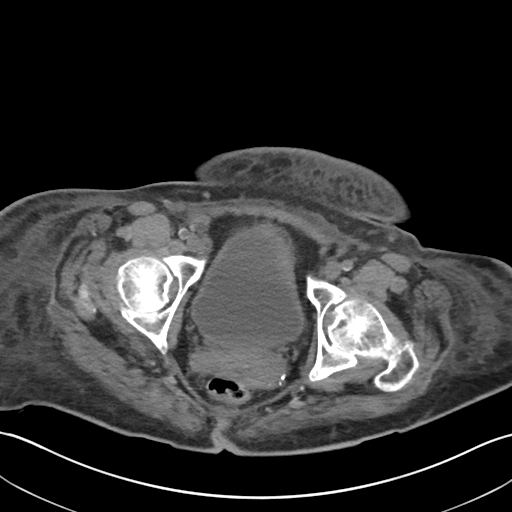
[im 22/95  soft-tissue]
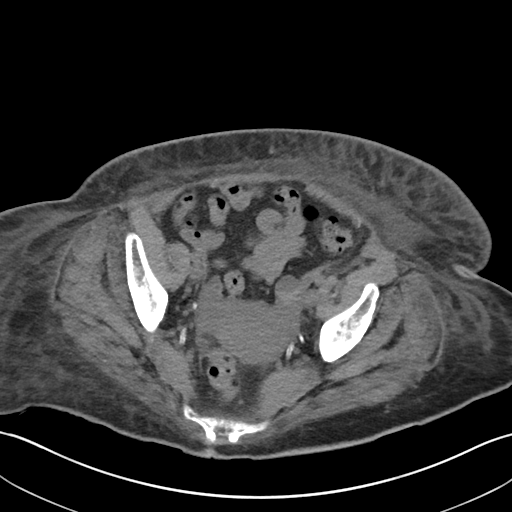
[im 28/95  soft-tissue]
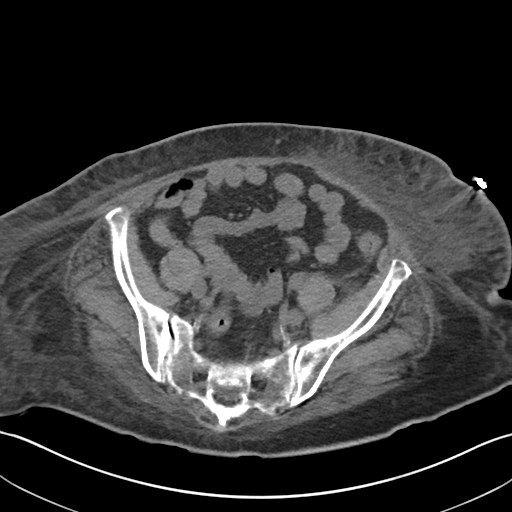
[im 37/95  soft-tissue]
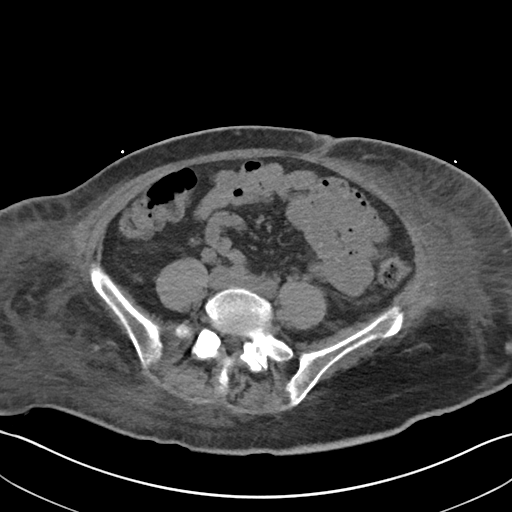
[im 43/95  soft-tissue]
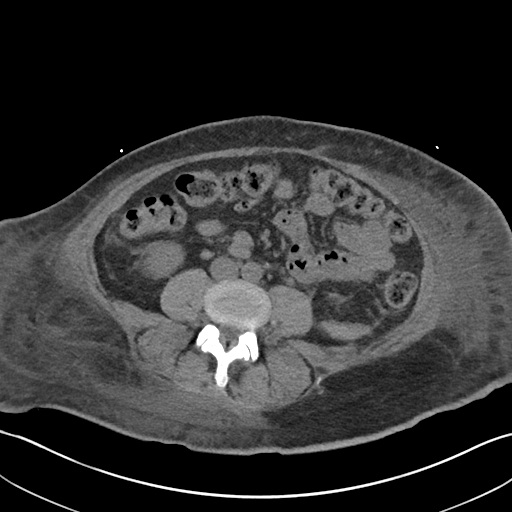
[im 52/95  soft-tissue]
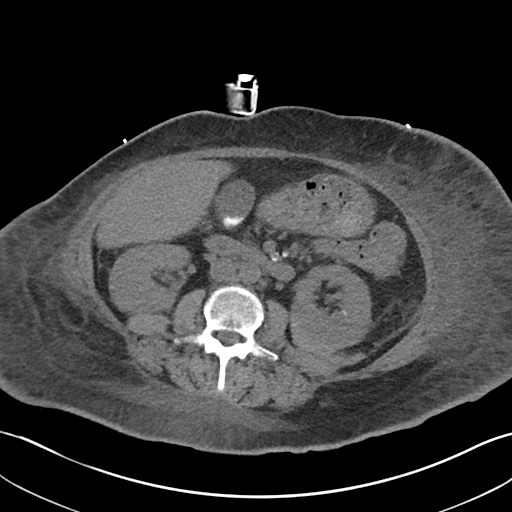
[im 58/95  soft-tissue]
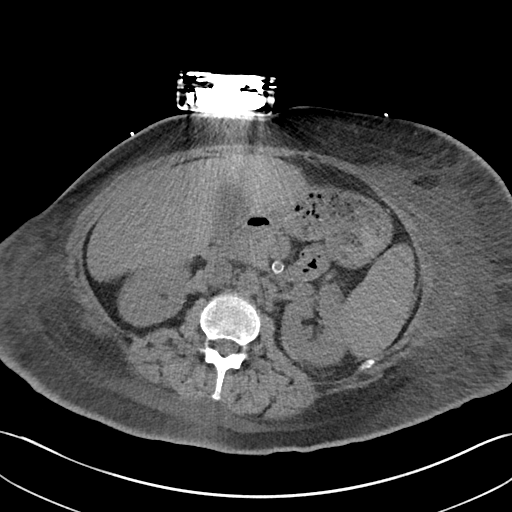
[im 67/95  soft-tissue]
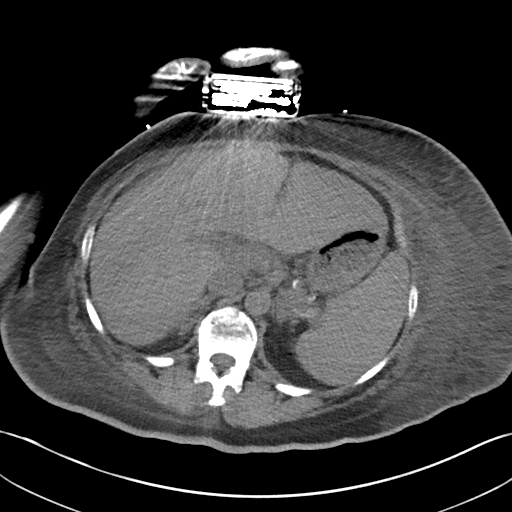
[im 67/95  bone]
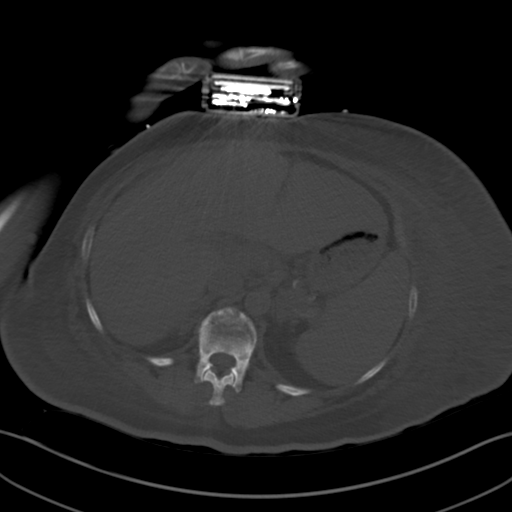
[im 73/95  soft-tissue]
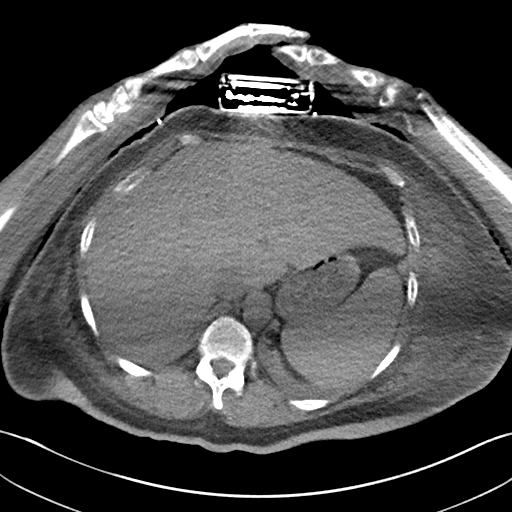
[im 82/95  soft-tissue]
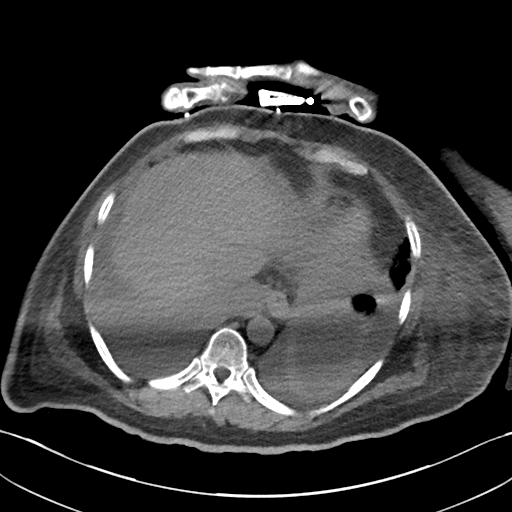
[im 88/95  soft-tissue]
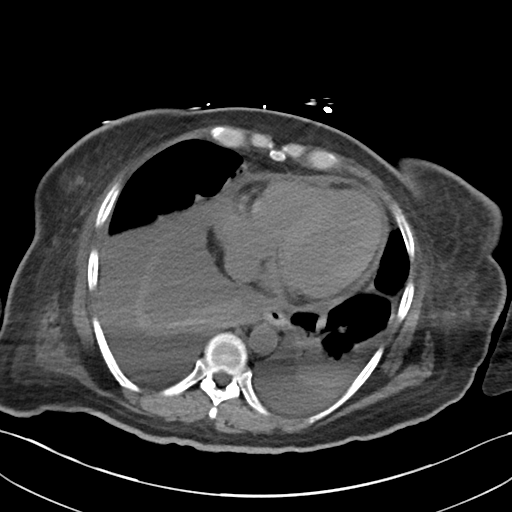

[Series 15: cor st · coronal · 0.95mm/px · 3 of 100 slices shown]
[im 34/100  soft-tissue]
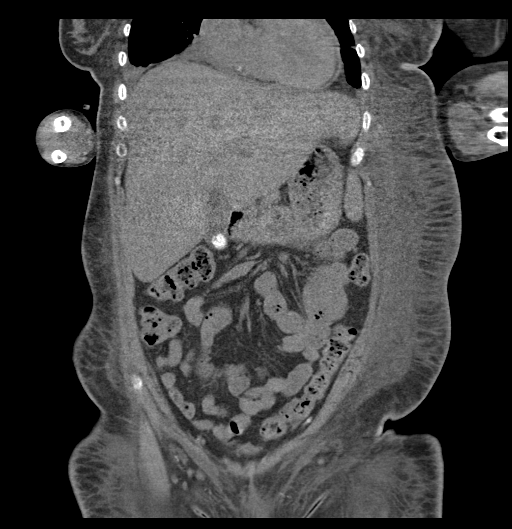
[im 45/100  soft-tissue]
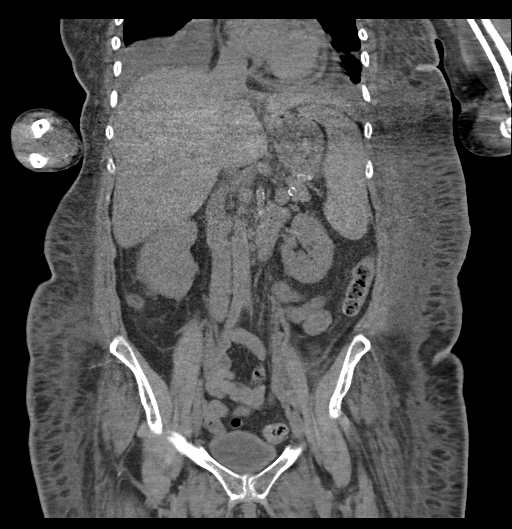
[im 56/100  soft-tissue]
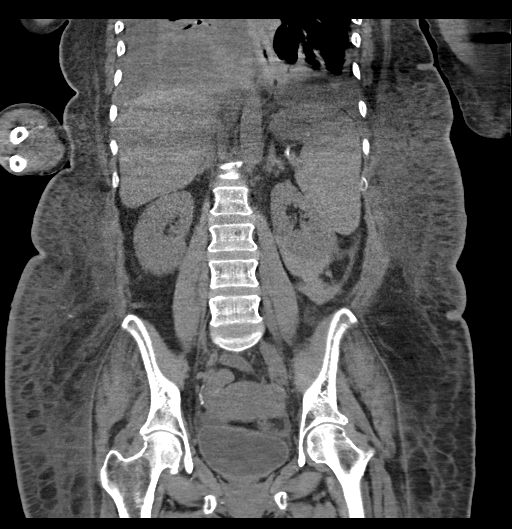

[15 of 46 positions shown; findings below may reference images not displayed]

FINDINGS: The lack of intravenous contrast limits the ability to evaluate
solid abdominal organs. Examination is further degraded secondary to
patient's overlying upper extremities and support apparatus

Lower chest: Limited visualization of the lower thorax demonstrates
small to moderate-sized bilateral pleural effusions with associated
bilateral lower lobe atelectasis/collapse with associated air
bronchograms, right greater than left.

Borderline cardiomegaly. There is diffuse decreased attenuation of
the intra cardiac blood pool suggestive of anemia. Interval
development of a small moderate-sized pericardial effusion.

Dialysis catheter tips terminate within the superior cavoatrial
junction.

Hepatobiliary: Hepatomegaly with mild nodularity hepatic contour,
unchanged. Layering hyperattenuating gallstones are seen within
otherwise normal-appearing gallbladder. No discrete gallbladder wall
thickening or pericholecystic stranding on this noncontrast
examination.

Pancreas: Normal noncontrast appearance of the pancreas.

Spleen: Normal noncontrast appearance of the spleen.

Adrenals/Urinary Tract: Note is made of a small left-sided
perinephric hematoma, the sequela of recent ultrasound-guided random
renal biopsy. While exact measurements are difficult secondary to
the crescentic nature of the hematoma, and is estimated to measure
approximately 3.6 x 3.6 x 3.0 cm (coronal image 59, series 15; axial
image 50, series 12).

Otherwise, normal noncontrast appearance of the bilateral kidneys.
No renal stones. No renal stones are seen along expected course of
either ureter or the urinary bladder. Normal appearance of the
urinary bladder given degree distention. No urinary obstruction or
perinephric stranding.

Normal noncontrast appearance the bilateral adrenal glands.

Stomach/Bowel: Moderate colonic stool burden without evidence of
enteric obstruction. Normal noncontrast appearance of the terminal
ileum and the retrocecal appendix. No hiatal hernia. No
pneumoperitoneum, pneumatosis or portal venous gas.

Vascular/Lymphatic: Crescentic atherosclerotic plaque most
conspicuous within the splenic artery, SMA and IMA as well as the
bilateral superficial femoral arteries. Normal caliber the abdominal
aorta

No bulky retroperitoneal, mesenteric, pelvic or inguinal
lymphadenopathy on this noncontrast examination.

Reproductive: Normal noncontrast appearance of the pelvic organs.
There is a small amount of free fluid in the pelvic cul-de-sac.

Other: Worsening diffuse body wall anasarca.

Musculoskeletal: Moderate (approximately 40%) compression deformity
involving the L1 vertebral body without associated retropulsion,
grossly unchanged compared to lumbar spine MRI performed [DATE].
Ex vacuo disc phenomenon involving the T12-L1 and L1-L2
intervertebral disc spaces.
IMPRESSION: 1. Small (approximately 3.6 cm) crescentic perinephric hematoma
following recent ultrasound-guided left renal biopsy.
2. Findings most suggestive of worsening congestive heart failure,
now with small to moderate-sized bilateral effusions, moderate-sized
pericardial effusion and diffuse body wall anasarca. Further
evaluation with cardiac echo could be performed as clinically
indicated.
3. Worsening bilateral lobe atelectasis/collapse, right greater than
left, likely atelectasis.
4. Moderate (approximately 40%) compression deformity involving the
L1 vertebral body, unchanged compared to lumbar spine CT performed
[DATE].
5. Cholelithiasis without evidence of cholecystitis.

## 2019-08-13 MED ORDER — HEPARIN SODIUM (PORCINE) 1000 UNIT/ML IJ SOLN
INTRAMUSCULAR | Status: AC
  Administered 2019-08-13: 3200 [IU]
  Filled 2019-08-13: qty 4

## 2019-08-13 MED ORDER — SODIUM CHLORIDE 0.9% IV SOLUTION: Freq: Once | INTRAVENOUS | Status: AC

## 2019-08-13 MED ORDER — OXYCODONE HCL 5 MG PO TABS
5.0000 mg | ORAL_TABLET | Freq: Two times a day (BID) | ORAL | Status: DC | PRN
  Administered 2019-08-13 – 2019-08-22 (×3): 5 mg via ORAL
  Filled 2019-08-13 (×3): qty 1

## 2019-08-13 NOTE — Progress Notes (Addendum)
[facility] Progress Note    Assessment/ Plan:   1. Rapidly progressive renal failure - w/ nephrotic range proteinuria (11gm nov 2020), hx of IDDM, but progressing rapidly over last 12 mos.  Had +ANA , other serologies negative. Reordered ANA w/ titers--> pending. SPEP slightly + in Nov, then repeat Jan 2021 was negative. Admitted for rising creat 6.7 w/ N/V. Alb 2.5.  She is increasingly uremic and needs dialysis as well as the kidney biopsy.  S/p TDC 2/2, s/p renal biopsy 2/3 and HD#1 2/3.  HD today 2/4.  Plan for next HD 2/6.         2. Anemia - Hb 6.2, sig issue. Getting prbc x 1 on admission.  Will start ESA and iron with dialysis--> darbepoetin 100 mcg q Wed and Ferrlicet to start Friday.  Hgb down to 6.6 this AM.  Will send for CT abd/ pelvis to make sure no RP bleed after biopsy (there was a small stable nonenlarging hematoma after biopsy yesterday) and get another 1 u pRBCS 2/4.  Also send FOBT.  Does not get heparin with HD.  3. HTN/ vol: stop IVFs  4. Metabolic acidosis: increase bicarb to 1300 TID, expect to improve on HD  5. DM: on insulin, per primary  6. Dispo: pending  Subjective:    S/p renal biopsy.  HD #2 today 2/4.  Hgb down to 6.6, I u pRBCs ordered.     Objective:   BP 130/78   Pulse 67   Temp 97.6 F (36.4 C) (Oral)   Resp 17   Ht 5\' 3"  (1.6 m)   Wt 79 kg   LMP 12/17/2016   SpO2 100%   BMI 30.85 kg/m   Intake/Output Summary (Last 24 hours) at 08/13/2019 0901 Last data filed at 08/13/2019 0330 Gross per 24 hour  Intake 240 ml  Output 503 ml  Net -263 ml   Weight change: 1 kg  Physical Exam: Gen: lying in bed, sleeping, NAD, on HD CVS: RRR Resp: normal WOB, some muffled bibasilar breath sounds Abd: nontender Ext: trace LE edema ACCESS: R IJ TDC  Imaging: IR Fluoro Guide CV Line Right  Result Date: 08/11/2019 INDICATION: 49 year old with acute on chronic renal failure. Patient needs hemodialysis. EXAM: FLUOROSCOPIC AND ULTRASOUND  GUIDED PLACEMENT OF A TUNNELED DIALYSIS CATHETER Physician: Stephan Minister. Anselm Pancoast, MD MEDICATIONS: Ancef 2 g; The antibiotic was administered within an appropriate time interval prior to skin puncture. ANESTHESIA/SEDATION: Versed 1.0 mg IV; Fentanyl 50 mcg IV; Moderate Sedation Time:  27 minutes The patient was continuously monitored during the procedure by the interventional radiology nurse under my direct supervision. FLUOROSCOPY TIME:  Fluoroscopy Time: 24 seconds, 1 mGy COMPLICATIONS: None immediate. PROCEDURE: The procedure was explained to the patient. The risks and benefits of the procedure were discussed and the patient's questions were addressed. Informed consent was obtained from the patient. The patient was placed supine on the interventional table. Ultrasound confirmed a patent right internal jugular vein. Ultrasound images were obtained for documentation. The right neck and chest was prepped and draped in a sterile fashion. The right neck was anesthetized with 1% lidocaine. Maximal barrier sterile technique was utilized including caps, mask, sterile gowns, sterile gloves, sterile drape, hand hygiene and skin antiseptic. A small incision was made with #11 blade scalpel. A 21 gauge needle directed into the right internal jugular vein with ultrasound guidance. A micropuncture dilator set was placed. A 19 cm tip to cuff Palindrome catheter was selected. The skin below  the right clavicle was anesthetized and a small incision was made with an #11 blade scalpel. A subcutaneous tunnel was formed to the vein dermatotomy site. The catheter was brought through the tunnel. The vein dermatotomy site was dilated to accommodate a peel-away sheath. The catheter was placed through the peel-away sheath and directed into the central venous structures. The tip of the catheter was placed at the SVC and right atrium junction with fluoroscopy. Fluoroscopic images were obtained for documentation. Both lumens were found to aspirate and  flush well. The proper amount of heparin was flushed in both lumens. The vein dermatotomy site was closed using a single layer of absorbable suture and Dermabond. Gel-Foam placed in the subcutaneous tract. The catheter was secured to the skin using Prolene suture. IMPRESSION: Successful placement of a right jugular tunneled dialysis catheter using ultrasound and fluoroscopic guidance. Electronically Signed   By: Markus Daft M.D.   On: 08/11/2019 13:10   IR US Guide Vasc Access Right  Result Date: 08/11/2019 INDICATION: 49 year old with acute on chronic renal failure. Patient needs hemodialysis. EXAM: FLUOROSCOPIC AND ULTRASOUND GUIDED PLACEMENT OF A TUNNELED DIALYSIS CATHETER Physician: Stephan Minister. Anselm Pancoast, MD MEDICATIONS: Ancef 2 g; The antibiotic was administered within an appropriate time interval prior to skin puncture. ANESTHESIA/SEDATION: Versed 1.0 mg IV; Fentanyl 50 mcg IV; Moderate Sedation Time:  27 minutes The patient was continuously monitored during the procedure by the interventional radiology nurse under my direct supervision. FLUOROSCOPY TIME:  Fluoroscopy Time: 24 seconds, 1 mGy COMPLICATIONS: None immediate. PROCEDURE: The procedure was explained to the patient. The risks and benefits of the procedure were discussed and the patient's questions were addressed. Informed consent was obtained from the patient. The patient was placed supine on the interventional table. Ultrasound confirmed a patent right internal jugular vein. Ultrasound images were obtained for documentation. The right neck and chest was prepped and draped in a sterile fashion. The right neck was anesthetized with 1% lidocaine. Maximal barrier sterile technique was utilized including caps, mask, sterile gowns, sterile gloves, sterile drape, hand hygiene and skin antiseptic. A small incision was made with #11 blade scalpel. A 21 gauge needle directed into the right internal jugular vein with ultrasound guidance. A micropuncture dilator set  was placed. A 19 cm tip to cuff Palindrome catheter was selected. The skin below the right clavicle was anesthetized and a small incision was made with an #11 blade scalpel. A subcutaneous tunnel was formed to the vein dermatotomy site. The catheter was brought through the tunnel. The vein dermatotomy site was dilated to accommodate a peel-away sheath. The catheter was placed through the peel-away sheath and directed into the central venous structures. The tip of the catheter was placed at the SVC and right atrium junction with fluoroscopy. Fluoroscopic images were obtained for documentation. Both lumens were found to aspirate and flush well. The proper amount of heparin was flushed in both lumens. The vein dermatotomy site was closed using a single layer of absorbable suture and Dermabond. Gel-Foam placed in the subcutaneous tract. The catheter was secured to the skin using Prolene suture. IMPRESSION: Successful placement of a right jugular tunneled dialysis catheter using ultrasound and fluoroscopic guidance. Electronically Signed   By: Markus Daft M.D.   On: 08/11/2019 13:10   US BIOPSY (KIDNEY)  Result Date: 08/12/2019 INDICATION: Acute on chronic renal insufficiency. Please perform renal biopsy for tissue diagnostic purposes. EXAM: ULTRASOUND GUIDED RENAL BIOPSY COMPARISON:  None. MEDICATIONS: None. ANESTHESIA/SEDATION: Fentanyl 12.5 mcg IV; Versed 0.5 mg IV  Total Moderate Sedation time: 10 minutes; The patient was continuously monitored during the procedure by the interventional radiology nurse under my direct supervision. COMPLICATIONS: SIR Level A - No therapy, no consequence. Procedure complicated by development of a small, non enlarging, asymptomatic left-sided perinephric hematoma. Patient complained of left upper extremity numbness prior to the initiation of the procedure however this was felt to be attributable to positioning with her arms held above her head. The symptoms were noted to persist  following the procedure and as such, this was relayed to providing hospitalist, Dr. Doristine Bosworth, for further evaluation. PROCEDURE: Informed written consent was obtained from the patient after a discussion of the risks, benefits and alternatives to treatment. The patient understands and consents the procedure. A timeout was performed prior to the initiation of the procedure. Ultrasound scanning was performed of the bilateral flanks. The inferior pole of the left kidney was selected for biopsy due to location and sonographic window. The procedure was planned. The operative site was prepped and draped in the usual sterile fashion. The overlying soft tissues were anesthetized with 1% lidocaine with epinephrine. A 17 gauge core needle biopsy device was advanced into the inferior cortex of the left kidney and 3 core biopsies were obtained under direct ultrasound guidance. Images were saved for documentation purposes. The biopsy device was removed and hemostasis was obtained with manual compression. Post procedural scanning demonstrated a small non enlarging asymptomatic left-sided perinephric hematoma. A dressing was placed. The patient tolerated the procedure well without immediate post procedural complication. IMPRESSION: Technically successful ultrasound guided left renal biopsy. Electronically Signed   By: Sandi Mariscal M.D.   On: 08/12/2019 09:13    Labs: BMET Recent Labs  Lab 08/08/19 1607 08/09/19 0829 08/10/19 8413 08/11/19 0522 08/12/19 0705 08/13/19 0721  NA 133* 136 136 133* 136 136  K 4.2 4.0 4.5 4.2 4.7 4.0  CL 106 107 106 106 102 104  CO2 16* 15* 14* 12* 13* 18*  GLUCOSE 263* 170* 103* 158* 149* 125*  BUN 73* 73* 83* 96* 101* 77*  CREATININE 6.40* 6.76* 7.00* 7.54* 8.08* 6.90*  CALCIUM 8.1* 7.7* 7.7* 7.3* 7.8* 7.6*  PHOS  --   --   --   --   --  7.0*   CBC Recent Labs  Lab 08/08/19 1607 08/09/19 0829 08/10/19 0635 08/11/19 0522 08/12/19 0705 08/13/19 0721  WBC 11.7*   < > 8.1 7.1  7.0 7.0  NEUTROABS 8.8*  --   --   --   --   --   HGB 7.5*   < > 7.3* 7.7* 7.8* 6.6*  HCT 22.9*   < > 22.3* 23.3* 24.2* 20.6*  MCV 94.6   < > 91.8 91.0 94.2 94.9  PLT 193   < > 182 174 173 171   < > = values in this interval not displayed.    Medications:    . sodium chloride   Intravenous Once  . amLODipine  5 mg Oral Daily  . carvedilol  6.25 mg Oral BID WC  . Chlorhexidine Gluconate Cloth  6 each Topical Q0600  . darbepoetin (ARANESP) injection - DIALYSIS  100 mcg Intravenous Q Wed-HD  . gabapentin  100 mg Oral BID  . insulin aspart  0-9 Units Subcutaneous TID WC  . insulin aspart protamine- aspart  15 Units Subcutaneous TID AC  . levothyroxine  25 mcg Oral Daily  . pantoprazole  40 mg Oral QHS  . pravastatin  40 mg Oral q1800  .  sodium bicarbonate  1,300 mg Oral TID      Madelon Lips MD 08/13/2019, 9:01 AM

## 2019-08-13 NOTE — Progress Notes (Signed)
Upper extremity vein mapping has been completed.   Preliminary results in CV Proc.   Abram Sander 08/13/2019 12:06 PM

## 2019-08-13 NOTE — Progress Notes (Signed)
OT Cancellation Note  Patient Details Name: Melanie Hall MRN: [id number] DOB: Nov 30, 1970   Cancelled Treatment:    Reason Eval/Treat Not Completed: Patient at procedure or test/ unavailable;Pain limiting ability to participate. Attempted to see to x 2 today. Pt at HD this morning and now reports severe HA this afternoon and requests OT return tomorrow.   Emmit Alexanders [facility] 08/13/2019, 2:46 PM

## 2019-08-13 NOTE — Progress Notes (Signed)
[name]  JOA:[id number] DOB: 1971/05/11 DOA: 08/08/2019 PCP: [facility] [place]   Brief Narrative:  Melanie Hall is a 49 y.o. female with medical history significant of chronic kidney disease stage V, hypertension, hyperlipidemia, diabetes mellitus type 2 who presented to the [facility] with intermittent chest pain, uncontrolled hypertension. Patient follows with nephrology as outpatient and was supposed to get an Epogen shot but was told she cannot get it as her blood pressure is over 601 systolic.  She complained of a gradual onset diffuse headache as well as right arm weakness which is been there for the past 2 weeks. She also has intermittent chest pain on the right side of her chest that comes and goes lasting for several minutes to hours at a time.here are some associated shortness of breath.  No PND.  No fever, chills, cough.  Upon arrival to ED, she was hemodynamically stable.  All the rest of the labs were fairly stable except her creatinine was 6.4, slightly worse from 5.19 on 07/23/2019.  Due to strokelike symptoms, she ended up having CT of the head which was unremarkable and subsequently she was transferred to [facility] in order to get MRI.  MRI was unremarkable.  Patient then started having worsening renal function.  Nephrology was consulted.  She started having uremic symptoms.  Nephrology consulted IR.  Patient underwent tunneled HD catheter on 08/11/2019 and then had renal biopsy today.  Assessment & Plan:   Principal Problem:   Acute on chronic renal failure (HCC) Active Problems:   Type 2 diabetes with nephropathy (HCC)   HYPERCHOLESTEROLEMIA   GERD (gastroesophageal reflux disease)   MGUS (monoclonal gammopathy of unknown significance)   Accelerated hypertension   Chest pain   Right arm weakness   Right arm weakness/headache: Patient had negative CT head as well as MRI of the brain.  Patient today started having some right forearm "  numbness" after renal biopsy.  I was informed about this by radiologist.  Radiologist also told me that patients are asked to keep their arms up while the biopsy is done and patients often have numbness because of this.  Patient in one side complaints of numbness but on the other side she tells me that she can feel the touch so there is no sensory loss.  She also complains of some tingling.  This raises suspicion for possible cervical stenosis/nerve impingement.  Will order cervical spine CT scan today.   Acute kidney injury on chronic kidney disease stage IV/progressive chronic kidney disease/uremia: Creatinine continues to get worse.  She came in with 6.40 >6.76>7.0> 7.54> 8.08.  She has signs of rapidly progressive chronic kidney disease.  Status post HD tunneled catheter placement on 08/11/2019 and renal biopsy on 08/12/2019.  Started dialysis on 08/12/2019.  Received another session today.  Diarrhea: Patient complains of having intermittent diarrhea for the past 3 months.  No mention of diarrhea in H&P.  Not sure if she ever mention to anybody else. She has not had any bowel movement in last 2 to 3 days. C. difficile, GI pathogen and ova and parasite were ordered but were never collected due to her not having any bowel movement.  Precautions will be discontinued today.  Acute on chronic anemia of chronic disease: Hemoglobin dropped from 7.5-6.2. She received 1 unit of PRBC transfusion on 2019-08-09. Posttransfusion hemoglobin 8.0 and then dropped back to 7.3> 7.7 . Iron studies indicate anemia of chronic disease. FOBT pending.  Dropped hemoglobin again to 6.6 today.  Received 1 unit of PRBC today with hemodialysis today.  Nephrology ordering CT abdomen pelvis to rule out hemorrhage at the site of renal biopsy.  Acute on chronic low back pain: Patient complains of low back pain which is actually chronic but has been worse now.  Also complains of bilateral hip pain.  She also told me that she sometimes cannot  feel when she has to pass stool and she has incontinence.  Denied any saddle anesthesia or any lower extremity weakness. Due to concern of spinal stenosis, CT lumbar spine was obtained which showed L1 compression fracture is stable from the skeletal survey earlier this month. 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 And there is posttraumatic or degenerative gas in the adjacent L1-L2 disc. No significant retropulsion of bone or complicating features.  Uncontrolled essential hypertension: Blood pressure mostly controlled but fluctuates. Continue home dose of amlodipine and carvedilol.  Type 2 diabetes mellitus: Takes NovoLog 70/30 15 units 3 times daily at home.  Blood sugar fairly controlled with some hypoglycemia intermittently.  Continue current regimen along with SSI.  Hyperlipidemia: Continue lovastatin.  Chronic bilateral pleural effusion: No hypoxia.  Monitor.  Chronic nonanion gap metabolic acidosis: Worsening, likely secondary to worsening renal function.  Nephrology on board.  Treat with dialysis.  Monoclonal gammopathy of undetermined significance: Patient is being followed by Dr. Delton Coombes from oncology service as outpatient. Recently noted to have nephrotic range proteinuria. Also found to have M spike of 0.2 g. Light chain ratio is mildly elevated at 2.36.  Plan was to have a skeletal survey and a repeat of myeloma labs including immunofixation.  Hypothyroidism: Continue Synthroid.  GERD: Continue PPI  Migraine headache: Resolved.  DVT prophylaxis: We will hold heparin for 24 hours now that she has had renal biopsy.  Placed on SCD. Code Status: Full code Family Communication:  None present at bedside.  Plan of care discussed with patient in length and he verbalized understanding and agreed with it. Patient came from: Home Disposition Plan: Will be discharged home with home health once improved and cleared by nephrology. Barriers to  discharge: Medical improvement and need for dialysis.  Estimated body mass index is 30.62 kg/m as calculated from the following:   Height as of this encounter: 5\' 3"  (1.6 m).   Weight as of this encounter: 78.4 kg.      Nutritional status:               Consultants:   Nephrology/IR  Procedures:   Tunneled HD catheter placement on 08/11/2019  Renal biopsy done on 08/12/2019  Antimicrobials:   None   Subjective: Seen and examined in the dialysis today.  Complains of right hand numbness but once again she states that she can feel the touch.  No other complaint.  Objective: Vitals:   08/13/19 1000 08/13/19 1015 08/13/19 1215 08/13/19 1235  BP: (!) 144/56 (!) 164/87 128/71 130/70  Pulse: 72 69 73 73  Resp:  19 16 14   Temp:  98 F (36.7 C) (!) 97.5 F (36.4 C) 98.3 F (36.8 C)  TempSrc:  Oral Oral Oral  SpO2:  100% 96% 97%  Weight:  78.4 kg    Height:        Intake/Output Summary (Last 24 hours) at 08/13/2019 1426 Last data filed at 08/13/2019 1100 Gross per 24 hour  Intake 490 ml  Output 1505 ml  Net -1015 ml   [employer]  08/13/19 0657 08/13/19 0950 08/13/19 1015  Weight: 79 kg 78.4 kg 78.4 kg    Examination:  General exam: Appears calm and comfortable  Respiratory system: Clear to auscultation. Respiratory effort normal. Cardiovascular system: S1 & S2 heard, RRR. No JVD, murmurs, rubs, gallops or clicks. No pedal edema. Gastrointestinal system: Abdomen is nondistended, soft and nontender. No organomegaly or masses felt. Normal bowel sounds heard. Central nervous system: Alert and oriented. No focal neurological deficits. Extremities: Symmetric 5 x 5 power. Skin: No rashes, lesions or ulcers.  Psychiatry: Judgement and insight appear poor. Mood & affect flat.  Data Reviewed: I have personally reviewed following labs and imaging studies  CBC: Recent Labs  Lab 08/08/19 1607 08/08/19 1607 08/09/19 0829 08/09/19 1317 08/09/19 2321  08/10/19 0635 08/11/19 0522 08/12/19 0705 08/13/19 0721  WBC 11.7*   < > 8.0  --   --  8.1 7.1 7.0 7.0  NEUTROABS 8.8*  --   --   --   --   --   --   --   --   HGB 7.5*   < > 6.2*   < > 8.0* 7.3* 7.7* 7.8* 6.6*  HCT 22.9*   < > 18.4*   < > 23.3* 22.3* 23.3* 24.2* 20.6*  MCV 94.6   < > 92.0  --   --  91.8 91.0 94.2 94.9  PLT 193   < > 138*  --   --  182 174 173 171   < > = values in this interval not displayed.   Basic Metabolic Panel: Recent Labs  Lab 08/09/19 0829 08/10/19 0635 08/11/19 0522 08/12/19 0705 08/13/19 0721  NA 136 136 133* 136 136  K 4.0 4.5 4.2 4.7 4.0  CL 107 106 106 102 104  CO2 15* 14* 12* 13* 18*  GLUCOSE 170* 103* 158* 149* 125*  BUN 73* 83* 96* 101* 77*  CREATININE 6.76* 7.00* 7.54* 8.08* 6.90*  CALCIUM 7.7* 7.7* 7.3* 7.8* 7.6*  MG 1.7  --   --   --   --   PHOS  --   --   --   --  7.0*   GFR: Estimated Creatinine Clearance: 9.8 mL/min (A) (by C-G formula based on SCr of 6.9 mg/dL (H)). Liver Function Tests: Recent Labs  Lab 08/08/19 1607 08/13/19 0721  AST 52*  --   ALT 47*  --   ALKPHOS 82  --   BILITOT 1.0  --   PROT 6.4*  --   ALBUMIN 2.5* 1.9*   Recent Labs  Lab 08/08/19 1607  LIPASE 17   No results for input(s): AMMONIA in the last 168 hours. Coagulation Profile: Recent Labs  Lab 08/11/19 0522  INR 1.2   Cardiac Enzymes: No results for input(s): CKTOTAL, CKMB, CKMBINDEX, TROPONINI in the last 168 hours. BNP (last 3 results) No results for input(s): PROBNP in the last 8760 hours. HbA1C: No results for input(s): HGBA1C in the last 72 hours. CBG: Recent Labs  Lab 08/12/19 1108 08/12/19 1614 08/12/19 2138 08/13/19 0618 08/13/19 1042  GLUCAP 70 131* 194* 108* 86   Lipid Profile: No results for input(s): CHOL, HDL, LDLCALC, TRIG, CHOLHDL, LDLDIRECT in the last 72 hours. Thyroid Function Tests: No results for input(s): TSH, T4TOTAL, FREET4, T3FREE, THYROIDAB in the last 72 hours. Anemia Panel: No results for input(s):  VITAMINB12, FOLATE, FERRITIN, TIBC, IRON, RETICCTPCT in the last 72 hours. Sepsis Labs: No results for input(s): PROCALCITON, LATICACIDVEN in the last 168 hours.  Recent  Results (from the past 240 hour(s))  Respiratory Panel by RT PCR (Flu A&B, Covid) - Nasopharyngeal Swab     Status: None   Collection Time: 08/08/19  5:50 PM   Specimen: Nasopharyngeal Swab  Result Value Ref Range Status   SARS Coronavirus 2 by RT PCR NEGATIVE NEGATIVE Final    Comment: (NOTE) SARS-CoV-2 target nucleic acids are NOT DETECTED. The SARS-CoV-2 RNA is generally detectable in upper respiratoy specimens during the acute phase of infection. The lowest concentration of SARS-CoV-2 viral copies this assay can detect is 131 copies/mL. A negative result does not preclude SARS-Cov-2 infection and should not be used as the sole basis for treatment or other patient management decisions. A negative result may occur with  improper specimen collection/handling, submission of specimen other than nasopharyngeal swab, presence of viral mutation(s) within the areas targeted by this assay, and inadequate number of viral copies (<131 copies/mL). A negative result must be combined with clinical observations, patient history, and epidemiological information. The expected result is Negative. Fact Sheet for Patients:  PinkCheek.be Fact Sheet for Healthcare Providers:  GravelBags.it This test is not yet ap proved or cleared by the [place] FDA and  has been authorized for detection and/or diagnosis of SARS-CoV-2 by FDA under an Emergency Use Authorization (EUA). This EUA will remain  in effect (meaning this test can be used) for the duration of the COVID-19 declaration under Section 564(b)(1) of the Act, 21 U.S.C. section 360bbb-3(b)(1), unless the authorization is terminated or revoked sooner.    Influenza A by PCR NEGATIVE NEGATIVE Final   Influenza B by PCR  NEGATIVE NEGATIVE Final    Comment: (NOTE) The Xpert Xpress SARS-CoV-2/FLU/RSV assay is intended as an aid in  the diagnosis of influenza from Nasopharyngeal swab specimens and  should not be used as a sole basis for treatment. Nasal washings and  aspirates are unacceptable for Xpert Xpress SARS-CoV-2/FLU/RSV  testing. Fact Sheet for Patients: PinkCheek.be Fact Sheet for Healthcare Providers: GravelBags.it This test is not yet approved or cleared by the [place] FDA and  has been authorized for detection and/or diagnosis of SARS-CoV-2 by  FDA under an Emergency Use Authorization (EUA). This EUA will remain  in effect (meaning this test can be used) for the duration of the  Covid-19 declaration under Section 564(b)(1) of the Act, 21  U.S.C. section 360bbb-3(b)(1), unless the authorization is  terminated or revoked. Performed at [facility], [address], [place], [place] [address]       Radiology Studies: US BIOPSY (KIDNEY)  Result Date: 08/12/2019 INDICATION: Acute on chronic renal insufficiency. Please perform renal biopsy for tissue diagnostic purposes. EXAM: ULTRASOUND GUIDED RENAL BIOPSY COMPARISON:  None. MEDICATIONS: None. ANESTHESIA/SEDATION: Fentanyl 12.5 mcg IV; Versed 0.5 mg IV Total Moderate Sedation time: 10 minutes; The patient was continuously monitored during the procedure by the interventional radiology nurse under my direct supervision. COMPLICATIONS: SIR Level A - No therapy, no consequence. Procedure complicated by development of a small, non enlarging, asymptomatic left-sided perinephric hematoma. Patient complained of left upper extremity numbness prior to the initiation of the procedure however this was felt to be attributable to positioning with her arms held above her head. The symptoms were noted to persist following the procedure and as such, this was relayed to providing hospitalist, Dr. Doristine Bosworth, for  further evaluation. PROCEDURE: Informed written consent was obtained from the patient after a discussion of the risks, benefits and alternatives to treatment. The patient understands and consents the procedure. A timeout  was performed prior to the initiation of the procedure. Ultrasound scanning was performed of the bilateral flanks. The inferior pole of the left kidney was selected for biopsy due to location and sonographic window. The procedure was planned. The operative site was prepped and draped in the usual sterile fashion. The overlying soft tissues were anesthetized with 1% lidocaine with epinephrine. A 17 gauge core needle biopsy device was advanced into the inferior cortex of the left kidney and 3 core biopsies were obtained under direct ultrasound guidance. Images were saved for documentation purposes. The biopsy device was removed and hemostasis was obtained with manual compression. Post procedural scanning demonstrated a small non enlarging asymptomatic left-sided perinephric hematoma. A dressing was placed. The patient tolerated the procedure well without immediate post procedural complication. IMPRESSION: Technically successful ultrasound guided left renal biopsy. Electronically Signed   By: Sandi Mariscal M.D.   On: 08/12/2019 09:13   VAS [place] UPPER EXT VEIN MAPPING (PRE-OP AVF)  Result Date: 08/13/2019 UPPER EXTREMITY VEIN MAPPING  Indications: History of PAD; patient is pre-operative for bypass. Comparison Study: no prior Performing Technologist: Abram Sander RVS  Examination Guidelines: A complete evaluation includes B-mode imaging, spectral Doppler, color Doppler, and power Doppler as needed of all accessible portions of each vessel. Bilateral testing is considered an integral part of a complete examination. Limited examinations for reoccurring indications may be performed as noted. +-----------------+-------------+----------+--------+  Right Cephalic    Diameter (cm) Depth (cm) Findings   +-----------------+-------------+----------+--------+  Shoulder              0.25         0.58              +-----------------+-------------+----------+--------+  Prox upper arm        0.26         0.78              +-----------------+-------------+----------+--------+  Mid upper arm         0.28         0.53              +-----------------+-------------+----------+--------+  Dist upper arm        0.29         0.32              +-----------------+-------------+----------+--------+  Antecubital fossa     0.50         0.35              +-----------------+-------------+----------+--------+  Prox forearm          0.23         0.44              +-----------------+-------------+----------+--------+  Mid forearm           0.23         0.28              +-----------------+-------------+----------+--------+  Dist forearm          0.22         0.29              +-----------------+-------------+----------+--------+ +-----------------+-------------+----------+--------+  Right Basilic     Diameter (cm) Depth (cm) Findings  +-----------------+-------------+----------+--------+  Prox upper arm        0.38         0.72              +-----------------+-------------+----------+--------+  Mid upper arm  0.33         0.68              +-----------------+-------------+----------+--------+  Dist upper arm        0.39         0.75              +-----------------+-------------+----------+--------+  Antecubital fossa     0.41         0.64              +-----------------+-------------+----------+--------+  Prox forearm          0.20         0.22              +-----------------+-------------+----------+--------+  Mid forearm           0.16         0.22              +-----------------+-------------+----------+--------+  Distal forearm        0.28         0.19              +-----------------+-------------+----------+--------+ +-----------------+-------------+----------+--------+  Left Cephalic     Diameter (cm) Depth (cm) Findings   +-----------------+-------------+----------+--------+  Shoulder              0.34         0.57              +-----------------+-------------+----------+--------+  Prox upper arm        0.34         0.55              +-----------------+-------------+----------+--------+  Mid upper arm         0.30         0.31              +-----------------+-------------+----------+--------+  Dist upper arm        0.32         0.34              +-----------------+-------------+----------+--------+  Antecubital fossa     0.53         0.23              +-----------------+-------------+----------+--------+  Prox forearm          0.29         0.45              +-----------------+-------------+----------+--------+  Mid forearm           0.22         0.23              +-----------------+-------------+----------+--------+  Dist forearm          0.20         0.28              +-----------------+-------------+----------+--------+ +-----------------+-------------+----------+--------+  Left Basilic      Diameter (cm) Depth (cm) Findings  +-----------------+-------------+----------+--------+  Prox upper arm        0.35         0.69              +-----------------+-------------+----------+--------+  Mid upper arm         0.34         0.66              +-----------------+-------------+----------+--------+  Dist upper arm        0.32  0.77              +-----------------+-------------+----------+--------+  Antecubital fossa     0.25         0.47              +-----------------+-------------+----------+--------+  Prox forearm          0.18         0.25              +-----------------+-------------+----------+--------+  Mid forearm           0.21         0.24              +-----------------+-------------+----------+--------+  Distal forearm        0.20         0.16              +-----------------+-------------+----------+--------+ *See table(s) above for measurements and observations.   Diagnosing physician:    Preliminary     Scheduled  Meds:  amLODipine  5 mg Oral Daily   carvedilol  6.25 mg Oral BID WC   Chlorhexidine Gluconate Cloth  6 each Topical Q0600   darbepoetin (ARANESP) injection - DIALYSIS  100 mcg Intravenous Q Wed-HD   gabapentin  100 mg Oral BID   insulin aspart  0-9 Units Subcutaneous TID WC   insulin aspart protamine- aspart  15 Units Subcutaneous TID AC   levothyroxine  25 mcg Oral Daily   pantoprazole  40 mg Oral QHS   pravastatin  40 mg Oral q1800   sodium bicarbonate  1,300 mg Oral TID   Continuous Infusions:  sodium chloride     sodium chloride     [START ON 08/14/2019] ferric gluconate (FERRLECIT/NULECIT) IV       LOS: 5 days   Time spent: 29 minutes   Darliss Cheney, MD [facility]  08/13/2019, 2:26 PM   To contact the attending provider between 7A-7P or the covering provider during after hours 7P-7A, please log into the web site [email or web address].

## 2019-08-13 NOTE — Progress Notes (Signed)
OT Cancellation Note  Patient Details Name: Melanie Hall MRN: [id number] DOB: 1971/04/05   Cancelled Treatment:    Reason Eval/Treat Not Completed: Fatigue/lethargy limiting ability to participate. Pt recently returned form HD, will try back laer as time allows  Britt Bottom 08/13/2019, 11:20 AM

## 2019-08-14 ENCOUNTER — Inpatient Hospital Stay (HOSPITAL_COMMUNITY): Payer: Medicaid Other

## 2019-08-14 DIAGNOSIS — I313 Pericardial effusion (noninflammatory): Secondary | ICD-10-CM

## 2019-08-14 DIAGNOSIS — N185 Chronic kidney disease, stage 5: Secondary | ICD-10-CM

## 2019-08-14 LAB — CBC
HCT: 24.6 % — ABNORMAL LOW (ref 36.0–46.0)
HCT: 23.6 % — ABNORMAL LOW (ref 36.0–46.0)
Hemoglobin: 8.3 g/dL — ABNORMAL LOW (ref 12.0–15.0)
Hemoglobin: 7.7 g/dL — ABNORMAL LOW (ref 12.0–15.0)
MCH: 29.9 pg (ref 26.0–34.0)
MCH: 30.1 pg (ref 26.0–34.0)
MCHC: 33.7 g/dL (ref 30.0–36.0)
MCHC: 32.6 g/dL (ref 30.0–36.0)
MCV: 88.5 fL (ref 80.0–100.0)
MCV: 92.2 fL (ref 80.0–100.0)
Platelets: 170 10*3/uL (ref 150–400)
Platelets: 165 10*3/uL (ref 150–400)
RBC: 2.78 MIL/uL — ABNORMAL LOW (ref 3.87–5.11)
RBC: 2.56 MIL/uL — ABNORMAL LOW (ref 3.87–5.11)
RDW: 16.6 % — ABNORMAL HIGH (ref 11.5–15.5)
RDW: 17.4 % — ABNORMAL HIGH (ref 11.5–15.5)
WBC: 7.5 10*3/uL (ref 4.0–10.5)
WBC: 7.7 10*3/uL (ref 4.0–10.5)
nRBC: 0 % (ref 0.0–0.2)
nRBC: 0 % (ref 0.0–0.2)

## 2019-08-14 LAB — BPAM RBC
Blood Product Expiration Date: 202102232359
ISSUE DATE / TIME: 202102041205
Unit Type and Rh: 9500

## 2019-08-14 LAB — BASIC METABOLIC PANEL
Anion gap: 10 (ref 5–15)
BUN: 47 mg/dL — ABNORMAL HIGH (ref 6–20)
CO2: 26 mmol/L (ref 22–32)
Calcium: 7.6 mg/dL — ABNORMAL LOW (ref 8.9–10.3)
Chloride: 100 mmol/L (ref 98–111)
Creatinine, Ser: 4.89 mg/dL — ABNORMAL HIGH (ref 0.44–1.00)
GFR calc Af Amer: 11 mL/min — ABNORMAL LOW (ref >60)
GFR calc non Af Amer: 10 mL/min — ABNORMAL LOW (ref >60)
Glucose, Bld: 134 mg/dL — ABNORMAL HIGH (ref 70–99)
Potassium: 3.5 mmol/L (ref 3.5–5.1)
Sodium: 136 mmol/L (ref 135–145)

## 2019-08-14 LAB — TYPE AND SCREEN
ABO/RH(D): O NEG
Antibody Screen: NEGATIVE
Unit division: 0

## 2019-08-14 LAB — GLUCOSE, CAPILLARY
Glucose-Capillary: 152 mg/dL — ABNORMAL HIGH (ref 70–99)
Glucose-Capillary: 110 mg/dL — ABNORMAL HIGH (ref 70–99)
Glucose-Capillary: 77 mg/dL (ref 70–99)
Glucose-Capillary: 58 mg/dL — ABNORMAL LOW (ref 70–99)
Glucose-Capillary: 65 mg/dL — ABNORMAL LOW (ref 70–99)
Glucose-Capillary: 189 mg/dL — ABNORMAL HIGH (ref 70–99)

## 2019-08-14 LAB — HEPATITIS B E ANTIBODY: Hep B E Ab: NEGATIVE

## 2019-08-14 LAB — HEPATITIS B E ANTIGEN: Hep B E Ag: POSITIVE — AB

## 2019-08-14 MED ORDER — ASPIRIN-ACETAMINOPHEN-CAFFEINE 250-250-65 MG PO TABS
1.0000 | ORAL_TABLET | Freq: Three times a day (TID) | ORAL | Status: DC | PRN
  Administered 2019-08-14 – 2019-08-25 (×11): 1 via ORAL
  Filled 2019-08-14 (×12): qty 1

## 2019-08-14 MED ORDER — GLUCOSE 40 % PO GEL
ORAL | Status: AC
  Administered 2019-08-14: 37.5 g
  Filled 2019-08-14: qty 1

## 2019-08-14 MED ORDER — GLUCOSE 40 % PO GEL
1.0000 | Freq: Once | ORAL | Status: AC
  Administered 2019-08-14: 37.5 g via ORAL

## 2019-08-14 MED ORDER — PROMETHAZINE HCL 25 MG/ML IJ SOLN
12.5000 mg | Freq: Four times a day (QID) | INTRAMUSCULAR | Status: DC | PRN
  Administered 2019-08-14 – 2019-08-17 (×2): 12.5 mg via INTRAVENOUS
  Filled 2019-08-14 (×2): qty 1

## 2019-08-14 MED ORDER — FERRIC CITRATE 1 GM 210 MG(FE) PO TABS
420.0000 mg | ORAL_TABLET | Freq: Three times a day (TID) | ORAL | Status: DC
  Administered 2019-08-14 – 2019-08-25 (×23): 420 mg via ORAL
  Filled 2019-08-14 (×35): qty 2

## 2019-08-14 MED ORDER — CHLORHEXIDINE GLUCONATE CLOTH 2 % EX PADS
6.0000 | MEDICATED_PAD | Freq: Every day | CUTANEOUS | Status: DC
  Administered 2019-08-15 – 2019-08-24 (×8): 6 via TOPICAL

## 2019-08-14 NOTE — Progress Notes (Signed)
Physical Therapy Treatment Patient Details Name: Melanie Hall MRN: [id number] DOB: 07/27/1970 Today's Date: 08/14/2019    History of Present Illness 49 y.o. female with medical history significant of chronic kidney disease stage V, hypertension, hyperlipidemia, diabetes mellitus type 2 who presented to the ER with intermittent chest pain, uncontrolled hypertension. She complains of a gradual onset diffuse headache and R arm weakness. Pt also with hx of monoclonal gammopathy.    PT Comments    Pt was seen for mobility on RW after initially taking a moment on side of bed due to nausea from HA.  Her plan is to progress with gait and time standing, as well as time OOB to increase her endurance for home.  HHPT is still appropriate to manage her ongoing strengthening needs, and to assess her for safety in that environment.  See acutely to work on LE strength as well.   Follow Up Recommendations  Home health PT;Supervision - Intermittent     Equipment Recommendations  None recommended by PT    Recommendations for Other Services       Precautions / Restrictions Precautions Precautions: Fall Precaution Comments: pt will not agree to let therapists assist her directly Restrictions Weight Bearing Restrictions: No    Mobility  Bed Mobility Overal bed mobility: Needs Assistance Bed Mobility: Sidelying to Sit   Sidelying to sit: Min assist          Transfers Overall transfer level: Needs assistance Equipment used: Rolling walker (2 wheeled);1 person hand held assist Transfers: Sit to/from Stand Sit to Stand: Min guard;Min assist            Ambulation/Gait Ambulation/Gait assistance: Min guard Gait Distance (Feet): 7 Feet Assistive device: Rolling walker (2 wheeled) Gait Pattern/deviations: Step-to pattern;Trunk flexed Gait velocity: reduced Gait velocity interpretation: <1.31 ft/sec, indicative of household ambulator General Gait Details: after zofran was up to  chair   Stairs             Wheelchair Mobility    Modified Rankin (Stroke Patients Only)       Balance Overall balance assessment: Needs assistance Sitting-balance support: Feet supported Sitting balance-Leahy Scale: Good     Standing balance support: Bilateral upper extremity supported;During functional activity Standing balance-Leahy Scale: Poor                              Cognition Arousal/Alertness: Awake/alert Behavior During Therapy: Flat affect Overall Cognitive Status: Within Functional Limits for tasks assessed                                 General Comments: pt was upset about having to get OOB and finally agreed to sit up in chair with legs reclined'      Exercises      General Comments        Pertinent Vitals/Pain Pain Assessment: Faces Faces Pain Scale: Hurts whole lot Pain Location: HA Pain Descriptors / Indicators: Guarding;Headache Pain Intervention(s): Limited activity within patient's tolerance;Monitored during session;Premedicated before session;Repositioned(Zofran given when pt sat up and vomited)    Home Living                      Prior Function            PT Goals (current goals can now be found in the care plan section) Acute Rehab PT Goals Patient Stated  Goal: to go home Progress towards PT goals: Progressing toward goals    Frequency    Min 3X/week      PT Plan Current plan remains appropriate    Co-evaluation PT/OT/SLP Co-Evaluation/Treatment: Yes Reason for Co-Treatment: For patient/therapist safety PT goals addressed during session: Mobility/safety with mobility;Other (comment)(pt reluctance to move)        AM-PAC PT "6 Clicks" Mobility   Outcome Measure  Help needed turning from your back to your side while in a flat bed without using bedrails?: None Help needed moving from lying on your back to sitting on the side of a flat bed without using bedrails?: A Little Help  needed moving to and from a bed to a chair (including a wheelchair)?: A Little Help needed standing up from a chair using your arms (e.g., wheelchair or bedside chair)?: A Little Help needed to walk in hospital room?: A Little Help needed climbing 3-5 steps with a railing? : Total 6 Click Score: 17    End of Session Equipment Utilized During Treatment: Gait belt Activity Tolerance: Patient limited by fatigue;Treatment limited secondary to medical complications (Comment);Patient limited by pain Patient left: in chair;with call bell/phone within reach;with chair alarm set Nurse Communication: Mobility status PT Visit Diagnosis: Unsteadiness on feet (R26.81);Muscle weakness (generalized) (M62.81)     Time: 3762-8315 PT Time Calculation (min) (ACUTE ONLY): 33 min  Charges:  $Therapeutic Activity: 8-22 mins                    Ramond Dial 08/14/2019, 1:14 PM   Mee Hives, PT MS Acute Rehab Dept. Number: [place] [facility] [place]

## 2019-08-14 NOTE — Consult Note (Addendum)
[name] NOTE   MRN : [id number]  Reason for Consult: ESRD Referring Physician: Dr. Doristine Bosworth  History of Present Illness: 49 y/o female with history of AKI on CKD.  Dixon placement yesterday 08/13/19 by IR now on HD.  We have been asked to place permanent access.  She has no history of chest implants or previous access.  She is right hand dominant. She is followed by Dr Theador Hawthorne in [place] for kidney failure   Past medical history includes: new HTN, DM II, and hypothyroidism.           Current Facility-Administered Medications  Medication Dose Route Frequency Provider Last Rate Last Admin  . 0.9 %  sodium chloride infusion  100 mL Intravenous PRN Madelon Lips, MD      . 0.9 %  sodium chloride infusion  100 mL Intravenous PRN Madelon Lips, MD      . acetaminophen (TYLENOL) tablet 650 mg  650 mg Oral Q4H PRN Lynetta Mare, MD   650 mg at 08/13/19 0115   Or  . acetaminophen (TYLENOL) 160 MG/5ML solution 650 mg  650 mg Per Tube Q4H PRN Lynetta Mare, MD       Or  . acetaminophen (TYLENOL) suppository 650 mg  650 mg Rectal Q4H PRN Lynetta Mare, MD      . amLODipine (NORVASC) tablet 5 mg  5 mg Oral Daily Lynetta Mare, MD   5 mg at 08/14/19 5027  . carvedilol (COREG) tablet 6.25 mg  6.25 mg Oral BID WC Lynetta Mare, MD   6.25 mg at 08/14/19 0842  . Chlorhexidine Gluconate Cloth 2 % PADS 6 each  6 each Topical Q0600 Madelon Lips, MD   6 each at 08/14/19 [phone]  . [START ON 08/15/2019] Chlorhexidine Gluconate Cloth 2 % PADS 6 each  6 each Topical Q0600 Rosita Fire, MD      . Darbepoetin Alfa (ARANESP) injection 100 mcg  100 mcg Intravenous Q Wed-HD Madelon Lips, MD      . ferric citrate (AURYXIA) tablet 420 mg  420 mg Oral TID WC Rosita Fire, MD      . ferric gluconate (NULECIT) 125 mg in sodium chloride 0.9 % 100 mL IVPB  125 mg Intravenous Q M,W,F-HD Madelon Lips, MD 110 mL/hr at 08/14/19 1317 125  mg at 08/14/19 1317  . gabapentin (NEURONTIN) capsule 100 mg  100 mg Oral BID Lynetta Mare, MD   100 mg at 08/14/19 8786  . insulin aspart (novoLOG) injection 0-9 Units  0-9 Units Subcutaneous TID WC Lynetta Mare, MD   2 Units at 08/14/19 0600  . insulin aspart protamine- aspart (NOVOLOG MIX 70/30) injection 15 Units  15 Units Subcutaneous TID AC Lynetta Mare, MD   15 Units at 08/14/19 1143  . labetalol (NORMODYNE) injection 10 mg  10 mg Intravenous Q6H PRN Lynetta Mare, MD      . levothyroxine (SYNTHROID) tablet 25 mcg  25 mcg Oral Daily Lynetta Mare, MD   25 mcg at 08/14/19 0555  . lidocaine (PF) (XYLOCAINE) 1 % injection 5 mL  5 mL Intradermal PRN Madelon Lips, MD      . lidocaine-prilocaine (EMLA) cream 1 application  1 application Topical PRN Madelon Lips, MD      . ondansetron [facility]) injection 4 mg  4 mg Intravenous Q6H PRN Lynetta Mare, MD   4 mg at 08/14/19 1215  . oxyCODONE (Oxy IR/ROXICODONE)  immediate release tablet 5 mg  5 mg Oral Q12H PRN Darliss Cheney, MD   5 mg at 08/14/19 0842  . pantoprazole (PROTONIX) EC tablet 40 mg  40 mg Oral QHS Karren Cobble, RPH   40 mg at 08/13/19 2133  . pentafluoroprop-tetrafluoroeth (GEBAUERS) aerosol 1 application  1 application Topical PRN Madelon Lips, MD      . pravastatin (PRAVACHOL) tablet 40 mg  40 mg Oral q1800 Lynetta Mare, MD   40 mg at 08/13/19 1722  . senna-docusate (Senokot-S) tablet 1 tablet  1 tablet Oral QHS PRN Lynetta Mare, MD   1 tablet at 08/11/19 2146    Pt meds include: Statin :Yes Betablocker: Yes ASA: No Other anticoagulants/antiplatelets: none  Past Medical History:  Diagnosis Date  . Anemia   . Cataracts, bilateral    surgery to remove  . CKD (chronic kidney disease)   . Diabetes mellitus    type 2  . GERD (gastroesophageal reflux disease)   . Hypertension   . SVD (spontaneous vaginal delivery)    x 4    Past Surgical History:  Procedure Laterality  Date  . EYE SURGERY Bilateral    cataracts removed  . FEMUR IM NAIL Left 10/28/2018   Procedure: RETROGRADE FEMORAL NAILING;  Surgeon: Meredith Pel, MD;  Location: [place];  Service: Orthopedics;  Laterality: Left;  . IM NAILING FEMORAL SHAFT RETROGRADE Left 10/28/2018  . IR FLUORO GUIDE CV LINE RIGHT  08/11/2019  . IR US GUIDE VASC ACCESS RIGHT  08/11/2019  . KNEE SURGERY    . TUBAL LIGATION      Social History Social History   Tobacco Use  . Smoking status: Former Smoker    Types: Cigarettes    Quit date: 1997    Years since quitting: 24.1  . Smokeless tobacco: Never Used  Substance Use Topics  . Alcohol use: No  . Drug use: No    Family History Family History  Problem Relation Age of Onset  . Diabetes Mother   . Hypertension Mother   . Bipolar disorder Mother   . Brain cancer Maternal Aunt   . Heart disease Maternal Grandmother   . Diabetes Maternal Grandmother   . Breast cancer Maternal Aunt   . Diabetes Sister     No Known Allergies   REVIEW OF SYSTEMS  General: [ ]  Weight loss, [ ]  Fever, [ ]  chills Neurologic: [ ]  Dizziness, [ ]  Blackouts, [ ]  Seizure [ ]  Stroke, [ ]  "Mini stroke", [ ]  Slurred speech, [ ]  Temporary blindness; [ ]  weakness in arms or legs, [ ]  Hoarseness [ ]  Dysphagia Cardiac: [ ]  Chest pain/pressure, [ ]  Shortness of breath at rest [x]  Shortness of breath with exertion, [ ]  Atrial fibrillation or irregular heartbeat  Vascular: [ ]  Pain in legs with walking, [ ]  Pain in legs at rest, [ ]  Pain in legs at night,  [ ]  Non-healing ulcer, [ ]  Blood clot in vein/DVT,   Pulmonary: [ ]  Home oxygen, [ ]  Productive cough, [ ]  Coughing up blood, [ ]  Asthma,  [ ]  Wheezing [ ]  COPD Musculoskeletal:  [x ] Arthritis, [ ]  Low back pain, [ ]  Joint pain Hematologic: [ ]  Easy Bruising, [ ]  Anemia; [ ]  Hepatitis Gastrointestinal: [ ]  Blood in stool, [ ]  Gastroesophageal Reflux/heartburn, Urinary: [ ]  chronic Kidney disease, [x ] on HD - [ ]  MWF or [ ]  TTHS, [  ] Burning with urination, [ ]  Difficulty  urinating Skin: [ ]  Rashes, [ ]  Wounds Psychological: [ ]  Anxiety, [ ]  Depression  Physical Examination Vitals:   08/13/19 1536 08/13/19 1800 08/14/19 0050 08/14/19 0546  BP: 133/78 134/70 (!) 146/79 (!) 156/82  Pulse: 68 69 68 68  Resp: 16 18 16 16   Temp: 97.8 F (36.6 C) 98.2 F (36.8 C) 97.8 F (36.6 C) 98 F (36.7 C)  TempSrc: Oral Oral Oral Oral  SpO2: 100% 96% 99% 99%  Weight:      Height:       Body mass index is 30.62 kg/m.  General:  WDWN in NAD HENT: WNL Eyes: Pupils equal Pulmonary: normal non-labored breathing , without Rales, rhonchi,  wheezing Cardiac: RRR, without  Murmurs, rubs or gallops; No carotid bruits Abdomen: soft, NT, no masses Skin: no rashes, ulcers noted;  no Gangrene , no cellulitis; no open wounds;   Vascular Exam/Pulses:palpable radial and brachial pulses B UE   Musculoskeletal: no muscle wasting or atrophy; no edema  Neurologic: A&O X 3; Appropriate Affect ;  SENSATION: normal; MOTOR FUNCTION: 5/5 Symmetric Speech is fluent/normal   Significant Diagnostic Studies: CBC Lab Results  Component Value Date   WBC 7.7 08/14/2019   HGB 7.7 (L) 08/14/2019   HCT 23.6 (L) 08/14/2019   MCV 92.2 08/14/2019   PLT 165 08/14/2019    BMET    Component Value Date/Time   NA 136 08/14/2019 0755   K 3.5 08/14/2019 0755   CL 100 08/14/2019 0755   CO2 26 08/14/2019 0755   GLUCOSE 134 (H) 08/14/2019 0755   BUN 47 (H) 08/14/2019 0755   CREATININE 4.89 (H) 08/14/2019 0755   CALCIUM 7.6 (L) 08/14/2019 0755   GFRNONAA 10 (L) 08/14/2019 0755   GFRAA 11 (L) 08/14/2019 0755   Estimated Creatinine Clearance: 13.8 mL/min (A) (by C-G formula based on SCr of 4.89 mg/dL (H)).  COAG Lab Results  Component Value Date   INR 1.2 08/11/2019      +-----------------+-------------+----------+--------+  Right Cephalic  Diameter (cm)Depth (cm)Findings  +-----------------+-------------+----------+--------+   Shoulder       0.25     0.58        +-----------------+-------------+----------+--------+  Prox upper arm    0.26     0.78        +-----------------+-------------+----------+--------+  Mid upper arm    0.28     0.53        +-----------------+-------------+----------+--------+  Dist upper arm    0.29     0.32        +-----------------+-------------+----------+--------+  Antecubital fossa  0.50     0.35        +-----------------+-------------+----------+--------+  Prox forearm     0.23     0.44        +-----------------+-------------+----------+--------+  Mid forearm     0.23     0.28        +-----------------+-------------+----------+--------+  Dist forearm     0.22     0.29        +-----------------+-------------+----------+--------+   +-----------------+-------------+----------+--------+  Right Basilic  Diameter (cm)Depth (cm)Findings  +-----------------+-------------+----------+--------+  Prox upper arm    0.38     0.72        +-----------------+-------------+----------+--------+  Mid upper arm    0.33     0.68        +-----------------+-------------+----------+--------+  Dist upper arm    0.39     0.75        +-----------------+-------------+----------+--------+  Antecubital fossa  0.41  0.64        +-----------------+-------------+----------+--------+  Prox forearm     0.20     0.22        +-----------------+-------------+----------+--------+  Mid forearm     0.16     0.22        +-----------------+-------------+----------+--------+  Distal forearm    0.28     0.19        +-----------------+-------------+----------+--------+   +-----------------+-------------+----------+--------+  Left Cephalic  Diameter (cm)Depth  (cm)Findings  +-----------------+-------------+----------+--------+  Shoulder       0.34     0.57        +-----------------+-------------+----------+--------+  Prox upper arm    0.34     0.55        +-----------------+-------------+----------+--------+  Mid upper arm    0.30     0.31        +-----------------+-------------+----------+--------+  Dist upper arm    0.32     0.34        +-----------------+-------------+----------+--------+  Antecubital fossa  0.53     0.23        +-----------------+-------------+----------+--------+  Prox forearm     0.29     0.45        +-----------------+-------------+----------+--------+  Mid forearm     0.22     0.23        +-----------------+-------------+----------+--------+  Dist forearm     0.20     0.28        +-----------------+-------------+----------+--------+   +-----------------+-------------+----------+--------+  Left Basilic   Diameter (cm)Depth (cm)Findings  +-----------------+-------------+----------+--------+  Prox upper arm    0.35     0.69        +-----------------+-------------+----------+--------+  Mid upper arm    0.34     0.66        +-----------------+-------------+----------+--------+  Dist upper arm    0.32     0.77        +-----------------+-------------+----------+--------+  Antecubital fossa  0.25     0.47        +-----------------+-------------+----------+--------+  Prox forearm     0.18     0.25        +-----------------+-------------+----------+--------+  Mid forearm     0.21     0.24        +-----------------+-------------+----------+--------+  Distal forearm    0.20     0.16        +-----------------+-------------+----------+--------+     ASSESSMENT/PLAN:   ESRD on HD via TDC placed 2 days ago by IR. She is right handed and has acceptable left cephalic and basilic veins on vein mapping.  We will schedule her in the near future for left UE AV fistula verses graft.     Melanie Hall 08/14/2019 4:16 PM  Agree with above. 2+ brachial 1+ radial pulse bilaterally. Pt with long history of diabetes now with renal failure.  No prior access.  She has adequate vein for left upper arm brachial cephalic AVF.  Risks benefits possible complications including but not limited to bleeding infection steal non maturation discussed with pt.  She has some baseline hand numbness from neuropathy.    Left AVF on Monday 2/7 by Dr Scot Dock  NPO p midnight Sunday Consent Cefuroxime on call  Melanie Hinds, MD [facility] Office: [phone]

## 2019-08-14 NOTE — Care Management (Signed)
Patient having test done. Spoke with [place] with HD. Called financial counselor Philis Kendall [phone] and left message.   Magdalen Spatz RN

## 2019-08-14 NOTE — Progress Notes (Signed)
Pt refused lunch time vital signs. NT when to get vital signs while PT and OT where working with patient. NT went back to get vital signs and patient refused.

## 2019-08-14 NOTE — Progress Notes (Signed)
Reviewed CT abdomen and pelvis which shows mild pericardial effusion.  Ordered stat transthoracic echo.  Patient hemodynamically stable.  Nephrology to do hemodialysis which will help as well.

## 2019-08-14 NOTE — Progress Notes (Signed)
[facility] Progress Note    Assessment/ Plan:   Melanie Gasparro Cruzis a 49 y.o.femalewith medical history significant ofCKD stage V, hypertension, hyperlipidemia, diabetes mellitus type 2, MGUS,who presented to the [facility] with intermittent chest pain, uncontrolled hypertension and worsening kidney function.   1. Rapidly progressive renal failure - w/ nephrotic range proteinuria (11gm nov 2020), hx of IDDM, but progressing rapidly over last 12 mos. Possible MGUS with KLR 2.47 on 1/14. Had +ANA , other serologies negative. Reordered ANA w/ titers--> ANA negative 2/1. SPEP slightly positive in Nov, repeat in Jan 2021 was negative. Admitted for rising creat 6.7 w/ nausea/vomiting. Alb 2.5.  She became increasingly uremic and required dialysis. HD tunneled catheter placement on 2/2. HD#1 2/3, HD#2 2/4, HD#3 2/6. Kidney biopsy 2/3 with prelim results of DM selerosis. Labs greatly improved today: Cr down to 4.89 and BUN 47   2. Anemia of Chronic Disease - Hb 6.2 repleted with prbc x 1 on 1/31. Hb back down to 6.6 on 1/4 and repleted to 8.3 with prbc during HD. Iron Panel on 1/31 with Fe of 22. Started ESA (darbepoetin 181mcg Q Wedn) and Ferric gluconate to start today (and every MWF) with HD treatment. Non-enlarging hematoma formation s/p renal biopsy. CT abd/pelvis without evidence of retroperitoneal bleed s/p biopsy. FOBT pending. Does not get heparin with HD.   3. HTN: holding IVFs. On carvedilol and amlodipine   4. Metabolic acidosis: Started Sodium bicarbonate 1300 TID on 2/2. Has improved with HD. Bicarb from 13 --> 18 --> 26 today  5. DM: insulin dependent  6. Hypothyroidism: on levothyroxine   7. HLD: on pravastatin   8. Hepatitis B: HB e ag positive, core ag neg  9. Dispo: pending   Subjective:    S/p renal biopsy and 2 rounds HD. No active retroperitoneal bleed. Patient continues to endorse nausea, dysgeusia, and headache. Today she denies chest pain, dyspnea, hiccups,  vomiting, abdominal pain, pruritis, or producing urine or BMs. Discussed preliminary renal biopsy results related to permanent DM kidney damage.    Objective:   BP (!) 156/82 (BP Location: Right Arm)   Pulse 68   Temp 98 F (36.7 C) (Oral)   Resp 16   Ht 5\' 3"  (1.6 m)   Wt 78.4 kg   LMP 12/17/2016   SpO2 99%   BMI 30.62 kg/m   Intake/Output Summary (Last 24 hours) at 08/14/2019 0858 Last data filed at 08/14/2019 0300 Gross per 24 hour  Intake 925 ml  Output 1002 ml  Net -77 ml   Weight change: -2.2 kg  Physical Exam: Gen: Resting in bed on RA. Patient cooperative with answering questions CVS: RRR, no rubs or murmurs, no JVD appreciated. Right sided TDC without signs of infection and dressing CDI.  Resp: normal WOB, muffled bibasilar breath sounds, clear to apices  Abd: nontender to palpation, BS present Ext: No rash or asterixis appreciated   Imaging: CT CERVICAL SPINE WO CONTRAST  Result Date: 08/13/2019 CLINICAL DATA:  Spinal stenosis. EXAM: CT CERVICAL SPINE WITHOUT CONTRAST TECHNIQUE: Multidetector CT imaging of the cervical spine was performed without intravenous contrast. Multiplanar CT image reconstructions were also generated. COMPARISON:  None. FINDINGS: Alignment: Normal Skull base and vertebrae: Negative for fracture or mass. Soft tissues and spinal canal: Spinal canal normal in size. No significant spinal stenosis. Disc levels: Disc spaces well maintained without significant disc space narrowing. Small central disc protrusions at C3-4 and C4-5 without significant stenosis of the canal. No significant foraminal encroachment.  Upper chest: Bilateral pleural effusions right greater than left. Visualized lung in the apices is clear bilaterally. Other: None IMPRESSION: Negative for spinal stenosis. No acute abnormality in the cervical spine. Mild degenerative changes. Bilateral pleural effusions, right greater than left. Electronically Signed   By: Franchot Gallo M.D.   On:  08/13/2019 17:20   US BIOPSY (KIDNEY)  Result Date: 08/12/2019 INDICATION: Acute on chronic renal insufficiency. Please perform renal biopsy for tissue diagnostic purposes. EXAM: ULTRASOUND GUIDED RENAL BIOPSY COMPARISON:  None. MEDICATIONS: None. ANESTHESIA/SEDATION: Fentanyl 12.5 mcg IV; Versed 0.5 mg IV Total Moderate Sedation time: 10 minutes; The patient was continuously monitored during the procedure by the interventional radiology nurse under my direct supervision. COMPLICATIONS: SIR Level A - No therapy, no consequence. Procedure complicated by development of a small, non enlarging, asymptomatic left-sided perinephric hematoma. Patient complained of left upper extremity numbness prior to the initiation of the procedure however this was felt to be attributable to positioning with her arms held above her head. The symptoms were noted to persist following the procedure and as such, this was relayed to providing hospitalist, Dr. Doristine Bosworth, for further evaluation. PROCEDURE: Informed written consent was obtained from the patient after a discussion of the risks, benefits and alternatives to treatment. The patient understands and consents the procedure. A timeout was performed prior to the initiation of the procedure. Ultrasound scanning was performed of the bilateral flanks. The inferior pole of the left kidney was selected for biopsy due to location and sonographic window. The procedure was planned. The operative site was prepped and draped in the usual sterile fashion. The overlying soft tissues were anesthetized with 1% lidocaine with epinephrine. A 17 gauge core needle biopsy device was advanced into the inferior cortex of the left kidney and 3 core biopsies were obtained under direct ultrasound guidance. Images were saved for documentation purposes. The biopsy device was removed and hemostasis was obtained with manual compression. Post procedural scanning demonstrated a small non enlarging asymptomatic  left-sided perinephric hematoma. A dressing was placed. The patient tolerated the procedure well without immediate post procedural complication. IMPRESSION: Technically successful ultrasound guided left renal biopsy. Electronically Signed   By: Sandi Mariscal M.D.   On: 08/12/2019 09:13   VAS [place] UPPER EXT VEIN MAPPING (PRE-OP AVF)  Result Date: 08/13/2019 UPPER EXTREMITY VEIN MAPPING  Indications: History of PAD; patient is pre-operative for bypass. Comparison Study: no prior Performing Technologist: Abram Sander RVS  Examination Guidelines: A complete evaluation includes B-mode imaging, spectral Doppler, color Doppler, and power Doppler as needed of all accessible portions of each vessel. Bilateral testing is considered an integral part of a complete examination. Limited examinations for reoccurring indications may be performed as noted. +-----------------+-------------+----------+--------+ Right Cephalic   Diameter (cm)Depth (cm)Findings +-----------------+-------------+----------+--------+ Shoulder             0.25        0.58            +-----------------+-------------+----------+--------+ Prox upper arm       0.26        0.78            +-----------------+-------------+----------+--------+ Mid upper arm        0.28        0.53            +-----------------+-------------+----------+--------+ Dist upper arm       0.29        0.32            +-----------------+-------------+----------+--------+ Antecubital fossa  0.50        0.35            +-----------------+-------------+----------+--------+ Prox forearm         0.23        0.44            +-----------------+-------------+----------+--------+ Mid forearm          0.23        0.28            +-----------------+-------------+----------+--------+ Dist forearm         0.22        0.29            +-----------------+-------------+----------+--------+ +-----------------+-------------+----------+--------+ Right Basilic     Diameter (cm)Depth (cm)Findings +-----------------+-------------+----------+--------+ Prox upper arm       0.38        0.72            +-----------------+-------------+----------+--------+ Mid upper arm        0.33        0.68            +-----------------+-------------+----------+--------+ Dist upper arm       0.39        0.75            +-----------------+-------------+----------+--------+ Antecubital fossa    0.41        0.64            +-----------------+-------------+----------+--------+ Prox forearm         0.20        0.22            +-----------------+-------------+----------+--------+ Mid forearm          0.16        0.22            +-----------------+-------------+----------+--------+ Distal forearm       0.28        0.19            +-----------------+-------------+----------+--------+ +-----------------+-------------+----------+--------+ Left Cephalic    Diameter (cm)Depth (cm)Findings +-----------------+-------------+----------+--------+ Shoulder             0.34        0.57            +-----------------+-------------+----------+--------+ Prox upper arm       0.34        0.55            +-----------------+-------------+----------+--------+ Mid upper arm        0.30        0.31            +-----------------+-------------+----------+--------+ Dist upper arm       0.32        0.34            +-----------------+-------------+----------+--------+ Antecubital fossa    0.53        0.23            +-----------------+-------------+----------+--------+ Prox forearm         0.29        0.45            +-----------------+-------------+----------+--------+ Mid forearm          0.22        0.23            +-----------------+-------------+----------+--------+ Dist forearm         0.20        0.28            +-----------------+-------------+----------+--------+ +-----------------+-------------+----------+--------+ Left Basilic      Diameter (cm)Depth (  cm)Findings +-----------------+-------------+----------+--------+ Prox upper arm       0.35        0.69            +-----------------+-------------+----------+--------+ Mid upper arm        0.34        0.66            +-----------------+-------------+----------+--------+ Dist upper arm       0.32        0.77            +-----------------+-------------+----------+--------+ Antecubital fossa    0.25        0.47            +-----------------+-------------+----------+--------+ Prox forearm         0.18        0.25            +-----------------+-------------+----------+--------+ Mid forearm          0.21        0.24            +-----------------+-------------+----------+--------+ Distal forearm       0.20        0.16            +-----------------+-------------+----------+--------+ *See table(s) above for measurements and observations.   Diagnosing physician: Servando Snare MD Electronically signed by Servando Snare MD on 08/13/2019 at 2:48:03 PM.    Final     Labs: BMET Recent Labs  Lab 08/08/19 1607 08/09/19 9983 08/10/19 3825 08/11/19 0522 08/12/19 0705 08/13/19 0721 08/14/19 0755  NA 133* 136 136 133* 136 136 136  K 4.2 4.0 4.5 4.2 4.7 4.0 3.5  CL 106 107 106 106 102 104 100  CO2 16* 15* 14* 12* 13* 18* 26  GLUCOSE 263* 170* 103* 158* 149* 125* 134*  BUN 73* 73* 83* 96* 101* 77* 47*  CREATININE 6.40* 6.76* 7.00* 7.54* 8.08* 6.90* 4.89*  CALCIUM 8.1* 7.7* 7.7* 7.3* 7.8* 7.6* 7.6*  PHOS  --   --   --   --   --  7.0*  --    CBC Recent Labs  Lab 08/08/19 1607 08/09/19 0829 08/12/19 0705 08/13/19 0721 08/13/19 1706 08/14/19 0755  WBC 11.7*   < > 7.0 7.0 7.5 7.7  NEUTROABS 8.8*  --   --   --   --   --   HGB 7.5*   < > 7.8* 6.6* 8.3* 7.7*  HCT 22.9*   < > 24.2* 20.6* 24.6* 23.6*  MCV 94.6   < > 94.2 94.9 88.5 92.2  PLT 193   < > 173 171 170 165   < > = values in this interval not displayed.    Medications:    . amLODipine  5 mg  Oral Daily  . carvedilol  6.25 mg Oral BID WC  . Chlorhexidine Gluconate Cloth  6 each Topical Q0600  . darbepoetin (ARANESP) injection - DIALYSIS  100 mcg Intravenous Q Wed-HD  . gabapentin  100 mg Oral BID  . insulin aspart  0-9 Units Subcutaneous TID WC  . insulin aspart protamine- aspart  15 Units Subcutaneous TID AC  . levothyroxine  25 mcg Oral Daily  . pantoprazole  40 mg Oral QHS  . pravastatin  40 mg Oral q1800  . sodium bicarbonate  1,300 mg Oral TID      Harrell Gave, PA Student 08/14/2019, 8:15 AM

## 2019-08-14 NOTE — Progress Notes (Signed)
Occupational Therapy Treatment Patient Details Name: Melanie Hall MRN: [id number] DOB: 1971/03/15 Today's Date: 08/14/2019    History of present illness 49 y.o. female with medical history significant of chronic kidney disease stage V, hypertension, hyperlipidemia, diabetes mellitus type 2 who presented to the ER with intermittent chest pain, uncontrolled hypertension. She complains of a gradual onset diffuse headache and R arm weakness. Pt also with hx of monoclonal gammopathy.   OT comments  Pt in bed upon arrival and required max encouragement to get OOB. Pt became agiated and upset about having to get out of bed, raising her voice and cursing at therapists. Pt sat EOB min A with increased time. Pt declined ADLs stating that she had already been washed up by nursing staff, however RN states that she had not. Pt also wearing dirty/soiled gown, donned clean gown with set up sitting EOB. Pt became nauseous with vomiting, RN notified and in to in to give Zofran. After several minutes,pt stood from EOB to RW min A - min guard A and transferred to recliner min guard A. OY to continue to follow acutely  Follow Up Recommendations  Home health OT;Supervision/Assistance - 24 hour    Equipment Recommendations  None recommended by OT    Recommendations for Other Services      Precautions / Restrictions Precautions Precautions: Fall Precaution Comments: pt will not agree to let therapists assist her directly Restrictions Weight Bearing Restrictions: No       Mobility Bed Mobility Overal bed mobility: Needs Assistance Bed Mobility: Sidelying to Sit   Sidelying to sit: Min assist          Transfers Overall transfer level: Needs assistance Equipment used: Rolling walker (2 wheeled);1 person hand held assist Transfers: Sit to/from Stand Sit to Stand: Min guard;Min assist              Balance Overall balance assessment: Needs assistance Sitting-balance support: Feet  supported Sitting balance-Leahy Scale: Good     Standing balance support: Bilateral upper extremity supported;During functional activity Standing balance-Leahy Scale: Poor                             ADL either performed or assessed with clinical judgement   ADL Overall ADL's : Needs assistance/impaired     Grooming: Wash/dry hands;Wash/dry face;Sitting           Upper Body Dressing : Set up;Sitting       Toilet Transfer: Minimal assistance;Min guard;Ambulation;RW Toilet Transfer Details (indicate cue type and reason): simulated to recliner, declined ambulation to bathroom and use of BSC           General ADL Comments: pt declined ADLs stating that she had already been washed up by nursing staff, however RN states that she had not. Pt also wearing dirty/soiled gown, donned clean gown with set up sitting EOB     Vision Patient Visual Report: No change from baseline     Perception     Praxis      Cognition Arousal/Alertness: Awake/alert Behavior During Therapy: Flat affect Overall Cognitive Status: Within Functional Limits for tasks assessed                                 General Comments: pt was agitated/upset about having to get OOB and finally agreed to sit up in chair with legs reclined  Exercises     Shoulder Instructions       General Comments      Pertinent Vitals/ Pain       Pain Assessment: Faces Faces Pain Scale: Hurts whole lot Pain Location: HA Pain Descriptors / Indicators: Guarding;Headache Pain Intervention(s): Limited activity within patient's tolerance;Monitored during session;Premedicated before session;Repositioned  Home Living                                          Prior Functioning/Environment              Frequency  Min 2X/week        Progress Toward Goals  OT Goals(current goals can now be found in the care plan section)  Progress towards OT goals: OT to  reassess next treatment  Acute Rehab OT Goals Patient Stated Goal: to go home  Plan Discharge plan remains appropriate    Co-evaluation    PT/OT/SLP Co-Evaluation/Treatment: Yes Reason for Co-Treatment: For patient/therapist safety PT goals addressed during session: Mobility/safety with mobility;Other (comment)(pt reluctance to move) OT goals addressed during session: ADL's and self-care;Proper use of Adaptive equipment and DME      AM-PAC OT "6 Clicks" Daily Activity     Outcome Measure   Help from another person eating meals?: None Help from another person taking care of personal grooming?: A Little Help from another person toileting, which includes using toliet, bedpan, or urinal?: A Little Help from another person bathing (including washing, rinsing, drying)?: A Little Help from another person to put on and taking off regular upper body clothing?: A Little Help from another person to put on and taking off regular lower body clothing?: A Little 6 Click Score: 19    End of Session Equipment Utilized During Treatment: Rolling walker  OT Visit Diagnosis: Unsteadiness on feet (R26.81);Other abnormalities of gait and mobility (R26.89);Muscle weakness (generalized) (M62.81)   Activity Tolerance Other (comment)(limited by HA, N/V)   Patient Left in chair;with call bell/phone within reach   Nurse Communication          Time: 1157-2620 OT Time Calculation (min): 27 min  Charges: OT General Charges $OT Visit: 1 Visit OT Treatments $Self Care/Home Management : 8-22 mins    Britt Bottom 08/14/2019, 1:42 PM

## 2019-08-14 NOTE — Progress Notes (Signed)
PROGRESS NOTE    Melanie Hall  ZCH:[id number] DOB: February 23, 1971 DOA: 08/08/2019 PCP: [facility] [place]   Brief Narrative:  Melanie Hall is a 49 y.o. female with medical history significant of chronic kidney disease stage V, hypertension, hyperlipidemia, diabetes mellitus type 2 who presented to the [facility] with intermittent chest pain, uncontrolled hypertension. Patient follows with nephrology as outpatient and was supposed to get an Epogen shot but was told she cannot get it as her blood pressure is over 287 systolic.  She complained of a gradual onset diffuse headache as well as right arm weakness which is been there for the past 2 weeks. She also has intermittent chest pain on the right side of her chest that comes and goes lasting for several minutes to hours at a time.here are some associated shortness of breath.  No PND.  No fever, chills, cough.  Upon arrival to ED, she was hemodynamically stable.  All the rest of the labs were fairly stable except her creatinine was 6.4, slightly worse from 5.19 on 07/23/2019.  Due to strokelike symptoms, she ended up having CT of the head which was unremarkable and subsequently she was transferred to [facility] in order to get MRI.  MRI was unremarkable.  Patient then started having worsening renal function.  Nephrology was consulted.  She started having uremic symptoms.  Nephrology consulted IR.  Patient underwent tunneled HD catheter on 08/11/2019 and then had renal biopsy today.  Assessment & Plan:   Principal Problem:   Acute on chronic renal failure (HCC) Active Problems:   Type 2 diabetes with nephropathy (HCC)   HYPERCHOLESTEROLEMIA   GERD (gastroesophageal reflux disease)   MGUS (monoclonal gammopathy of unknown significance)   Accelerated hypertension   Chest pain   Right arm weakness   Right arm weakness/headache: Patient had negative CT head as well as MRI of the brain.  Patient today started having some right forearm "  numbness" after renal biopsy.  I was informed about this by radiologist.  Radiologist also told me that patients are asked to keep their arms up while the biopsy is done and patients often have numbness because of this.  Patient continued to complain of right hand numbness for 2 days on February 3 and 4 however while she was also complaining of numbness, she also stated that she can feel the touch so there was no sensory loss.  Out of concern of possible cervical spinal stenosis, CT cervical spine was obtained which was unremarkable.  Today patient did not bring up any complaint of numbness in the right hand.  OT has been consulted but they have not been able to see her for last 3 days.   Acute kidney injury on chronic kidney disease stage IV/progressive chronic kidney disease/uremia: Creatinine continues to get worse.  She came in with 6.40 >6.76>7.0> 7.54> 8.08.  She has signs of rapidly progressive chronic kidney disease.  Status post HD tunneled catheter placement on 08/11/2019 and renal biopsy on 08/12/2019.  Started dialysis on 08/12/2019.  Received another session yesterday and next dialysis tomorrow.  Appreciate nephrology help.  Diarrhea: Patient complains of having intermittent diarrhea for the past 3 months.  No mention of diarrhea in H&P.  Not sure if she ever mention to anybody else. She has not had any bowel movement in last 2 to 3 days. C. difficile, GI pathogen and ova and parasite were ordered but were never collected due to her not having any bowel movement.  Precautions  will be discontinued today.  Acute on chronic anemia of chronic disease: Hemoglobin dropped from 7.5-6.2. She received 1 unit of PRBC transfusion on 2019-08-09. Posttransfusion hemoglobin 8.0 and then dropped back to 7.3> 7.7 . Iron studies indicate anemia of chronic disease. FOBT pending.  Dropped hemoglobin again to 6.6 on 08/13/2019 and received 1 unit of PRBC with hemodialysis nephrology ordering CT abdomen pelvis which is done  but results are still pending.  Lower extremity weakness bilateral: Today patient complains of bilateral lower extremity weakness however on exam she has 5 out of 5 power and some decreased sensation however she carries a history of peripheral neuropathy and she is on gabapentin for a long time.  This is one of her chronic subjective complaint as well.  Acute on chronic low back pain: Patient complains of low back pain which is actually chronic but has been worse now.  Also complains of bilateral hip pain.  She also told me that she sometimes cannot feel when she has to pass stool and she has incontinence.  Denied any saddle anesthesia or any lower extremity weakness. Due to concern of spinal stenosis, CT lumbar spine was obtained which showed L1 compression fracture is stable from the skeletal survey earlier this month. 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 And there is posttraumatic or degenerative gas in the adjacent L1-L2 disc. No significant retropulsion of bone or complicating features.  Uncontrolled essential hypertension: Blood pressure mostly controlled but fluctuates. Continue home dose of amlodipine and carvedilol.  Type 2 diabetes mellitus: Takes NovoLog 70/30 15 units 3 times daily at home.  Blood sugar fairly controlled with some hypoglycemia intermittently.  Continue current regimen along with SSI.  Hyperlipidemia: Continue lovastatin.  Chronic bilateral pleural effusion: No hypoxia.  Monitor.  Chronic nonanion gap metabolic acidosis: Worsening, likely secondary to worsening renal function.  Nephrology on board.  Treat with dialysis.  Monoclonal gammopathy of undetermined significance: Patient is being followed by Dr. Delton Coombes from oncology service as outpatient. Recently noted to have nephrotic range proteinuria. Also found to have M spike of 0.2 g. Light chain ratio is mildly elevated at 2.36.  Plan was to have a skeletal survey and a  repeat of myeloma labs including immunofixation.  Hypothyroidism: Continue Synthroid.  GERD: Continue PPI  Migraine headache: Resolved.  DVT prophylaxis: We will hold heparin for 24 hours now that she has had renal biopsy.  Placed on SCD. Code Status: Full code Family Communication:  None present at bedside.  Plan of care discussed with patient in length and he verbalized understanding and agreed with it. Patient came from: Home Disposition Plan: Will be discharged home with home health once improved and cleared by nephrology. Barriers to discharge: Medical improvement and need for dialysis.  Estimated body mass index is 30.62 kg/m as calculated from the following:   Height as of this encounter: 5\' 3"  (1.6 m).   Weight as of this encounter: 78.4 kg.      Nutritional status:               Consultants:   Nephrology/IR  Procedures:   Tunneled HD catheter placement on 08/11/2019  Renal biopsy done on 08/12/2019  Antimicrobials:   None   Subjective: Seen and examined.  Complains of bilateral lower extremity weakness and numbness however she has normal/full power in both lower extremities.  Objective: Vitals:   08/13/19 1536 08/13/19 1800 08/14/19 0050 08/14/19 0546  BP: 133/78 134/70 (!) 146/79 (!) 156/82  Pulse:  68 69 68 68  Resp: 16 18 16 16   Temp: 97.8 F (36.6 C) 98.2 F (36.8 C) 97.8 F (36.6 C) 98 F (36.7 C)  TempSrc: Oral Oral Oral Oral  SpO2: 100% 96% 99% 99%  Weight:      Height:        Intake/Output Summary (Last 24 hours) at 08/14/2019 0956 Last data filed at 08/14/2019 0300 Gross per 24 hour  Intake 925 ml  Output 501 ml  Net 424 ml   Filed Weights   08/13/19 0657 08/13/19 0950 08/13/19 1015  Weight: 79 kg 78.4 kg 78.4 kg    Examination:  General exam: Appears calm and comfortable  Respiratory system: Clear to auscultation. Respiratory effort normal. Cardiovascular system: S1 & S2 heard, RRR. No JVD, murmurs, rubs, gallops or  clicks. No pedal edema. Gastrointestinal system: Abdomen is nondistended, soft and nontender. No organomegaly or masses felt. Normal bowel sounds heard. Central nervous system: Alert and oriented. No focal neurological deficits except some degree of decreased sensation in lower extremities, this is chronic. Extremities: Symmetric 5 x 5 power. Skin: No rashes, lesions or ulcers.  Psychiatry: Judgement and insight appear poor, mood & affect flat,   Data Reviewed: I have personally reviewed following labs and imaging studies  CBC: Recent Labs  Lab 08/08/19 1607 08/09/19 0829 08/11/19 0522 08/12/19 0705 08/13/19 0721 08/13/19 1706 08/14/19 0755  WBC 11.7*   < > 7.1 7.0 7.0 7.5 7.7  NEUTROABS 8.8*  --   --   --   --   --   --   HGB 7.5*   < > 7.7* 7.8* 6.6* 8.3* 7.7*  HCT 22.9*   < > 23.3* 24.2* 20.6* 24.6* 23.6*  MCV 94.6   < > 91.0 94.2 94.9 88.5 92.2  PLT 193   < > 174 173 171 170 165   < > = values in this interval not displayed.   Basic Metabolic Panel: Recent Labs  Lab 08/09/19 0829 08/09/19 0829 08/10/19 3536 08/11/19 0522 08/12/19 0705 08/13/19 0721 08/14/19 0755  NA 136   < > 136 133* 136 136 136  K 4.0   < > 4.5 4.2 4.7 4.0 3.5  CL 107   < > 106 106 102 104 100  CO2 15*   < > 14* 12* 13* 18* 26  GLUCOSE 170*   < > 103* 158* 149* 125* 134*  BUN 73*   < > 83* 96* 101* 77* 47*  CREATININE 6.76*   < > 7.00* 7.54* 8.08* 6.90* 4.89*  CALCIUM 7.7*   < > 7.7* 7.3* 7.8* 7.6* 7.6*  MG 1.7  --   --   --   --   --   --   PHOS  --   --   --   --   --  7.0*  --    < > = values in this interval not displayed.   GFR: Estimated Creatinine Clearance: 13.8 mL/min (A) (by C-G formula based on SCr of 4.89 mg/dL (H)). Liver Function Tests: Recent Labs  Lab 08/08/19 1607 08/13/19 0721  AST 52*  --   ALT 47*  --   ALKPHOS 82  --   BILITOT 1.0  --   PROT 6.4*  --   ALBUMIN 2.5* 1.9*   Recent Labs  Lab 08/08/19 1607  LIPASE 17   No results for input(s): AMMONIA in the  last 168 hours. Coagulation Profile: Recent Labs  Lab 08/11/19 0522  INR  1.2   Cardiac Enzymes: No results for input(s): CKTOTAL, CKMB, CKMBINDEX, TROPONINI in the last 168 hours. BNP (last 3 results) No results for input(s): PROBNP in the last 8760 hours. HbA1C: No results for input(s): HGBA1C in the last 72 hours. CBG: Recent Labs  Lab 08/13/19 0618 08/13/19 1042 08/13/19 1642 08/13/19 2138 08/14/19 0548  GLUCAP 108* 86 184* 122* 152*   Lipid Profile: No results for input(s): CHOL, HDL, LDLCALC, TRIG, CHOLHDL, LDLDIRECT in the last 72 hours. Thyroid Function Tests: No results for input(s): TSH, T4TOTAL, FREET4, T3FREE, THYROIDAB in the last 72 hours. Anemia Panel: No results for input(s): VITAMINB12, FOLATE, FERRITIN, TIBC, IRON, RETICCTPCT in the last 72 hours. Sepsis Labs: No results for input(s): PROCALCITON, LATICACIDVEN in the last 168 hours.  Recent Results (from the past 240 hour(s))  Respiratory Panel by RT PCR (Flu A&B, Covid) - Nasopharyngeal Swab     Status: None   Collection Time: 08/08/19  5:50 PM   Specimen: Nasopharyngeal Swab  Result Value Ref Range Status   SARS Coronavirus 2 by RT PCR NEGATIVE NEGATIVE Final    Comment: (NOTE) SARS-CoV-2 target nucleic acids are NOT DETECTED. The SARS-CoV-2 RNA is generally detectable in upper respiratoy specimens during the acute phase of infection. The lowest concentration of SARS-CoV-2 viral copies this assay can detect is 131 copies/mL. A negative result does not preclude SARS-Cov-2 infection and should not be used as the sole basis for treatment or other patient management decisions. A negative result may occur with  improper specimen collection/handling, submission of specimen other than nasopharyngeal swab, presence of viral mutation(s) within the areas targeted by this assay, and inadequate number of viral copies (<131 copies/mL). A negative result must be combined with clinical observations, patient  history, and epidemiological information. The expected result is Negative. Fact Sheet for Patients:  PinkCheek.be Fact Sheet for Healthcare Providers:  GravelBags.it This test is not yet ap proved or cleared by the [place] FDA and  has been authorized for detection and/or diagnosis of SARS-CoV-2 by FDA under an Emergency Use Authorization (EUA). This EUA will remain  in effect (meaning this test can be used) for the duration of the COVID-19 declaration under Section 564(b)(1) of the Act, 21 U.S.C. section 360bbb-3(b)(1), unless the authorization is terminated or revoked sooner.    Influenza A by PCR NEGATIVE NEGATIVE Final   Influenza B by PCR NEGATIVE NEGATIVE Final    Comment: (NOTE) The Xpert Xpress SARS-CoV-2/FLU/RSV assay is intended as an aid in  the diagnosis of influenza from Nasopharyngeal swab specimens and  should not be used as a sole basis for treatment. Nasal washings and  aspirates are unacceptable for Xpert Xpress SARS-CoV-2/FLU/RSV  testing. Fact Sheet for Patients: PinkCheek.be Fact Sheet for Healthcare Providers: GravelBags.it This test is not yet approved or cleared by the [place] FDA and  has been authorized for detection and/or diagnosis of SARS-CoV-2 by  FDA under an Emergency Use Authorization (EUA). This EUA will remain  in effect (meaning this test can be used) for the duration of the  Covid-19 declaration under Section 564(b)(1) of the Act, 21  U.S.C. section 360bbb-3(b)(1), unless the authorization is  terminated or revoked. Performed at [facility], [address], [place], [place] [address]       Radiology Studies: CT ABDOMEN PELVIS WO CONTRAST  Result Date: 08/14/2019 CLINICAL DATA:  Evaluate for retroperitoneal hematoma. Recent left-sided ultrasound-guided random renal biopsy. EXAM: CT ABDOMEN AND PELVIS WITHOUT CONTRAST  TECHNIQUE: Multidetector CT imaging of  the abdomen and pelvis was performed following the standard protocol without IV contrast. COMPARISON:  Ultrasound-guided left-sided random renal biopsy-08/12/2019; CT abdomen and pelvis-11/03/2018; 08/26/2018; lumbar spine CT -08/09/2019 FINDINGS: The lack of intravenous contrast limits the ability to evaluate solid abdominal organs. Examination is further degraded secondary to patient's overlying upper extremities and support apparatus Lower chest: Limited visualization of the lower thorax demonstrates small to moderate-sized bilateral pleural effusions with associated bilateral lower lobe atelectasis/collapse with associated air bronchograms, right greater than left. Borderline cardiomegaly. There is diffuse decreased attenuation of the intra cardiac blood pool suggestive of anemia. Interval development of a small moderate-sized pericardial effusion. Dialysis catheter tips terminate within the superior cavoatrial junction. Hepatobiliary: Hepatomegaly with mild nodularity hepatic contour, unchanged. Layering hyperattenuating gallstones are seen within otherwise normal-appearing gallbladder. No discrete gallbladder wall thickening or pericholecystic stranding on this noncontrast examination. Pancreas: Normal noncontrast appearance of the pancreas. Spleen: Normal noncontrast appearance of the spleen. Adrenals/Urinary Tract: Note is made of a small left-sided perinephric hematoma, the sequela of recent ultrasound-guided random renal biopsy. While exact measurements are difficult secondary to the crescentic nature of the hematoma, and is estimated to measure approximately 3.6 x 3.6 x 3.0 cm (coronal image 59, series 15; axial image 50, series 12). Otherwise, normal noncontrast appearance of the bilateral kidneys. No renal stones. No renal stones are seen along expected course of either ureter or the urinary bladder. Normal appearance of the urinary bladder given degree distention.  No urinary obstruction or perinephric stranding. Normal noncontrast appearance the bilateral adrenal glands. Stomach/Bowel: Moderate colonic stool burden without evidence of enteric obstruction. Normal noncontrast appearance of the terminal ileum and the retrocecal appendix. No hiatal hernia. No pneumoperitoneum, pneumatosis or portal venous gas. Vascular/Lymphatic: Crescentic atherosclerotic plaque most conspicuous within the splenic artery, SMA and IMA as well as the bilateral superficial femoral arteries. Normal caliber the abdominal aorta No bulky retroperitoneal, mesenteric, pelvic or inguinal lymphadenopathy on this noncontrast examination. Reproductive: Normal noncontrast appearance of the pelvic organs. There is a small amount of free fluid in the pelvic cul-de-sac. Other: Worsening diffuse body wall anasarca. Musculoskeletal: Moderate (approximately 40%) compression deformity involving the L1 vertebral body without associated retropulsion, grossly unchanged compared to lumbar spine MRI performed 08/09/2019. Ex vacuo disc phenomenon involving the T12-L1 and L1-L2 intervertebral disc spaces. IMPRESSION: 1. Small (approximately 3.6 cm) crescentic perinephric hematoma following recent ultrasound-guided left renal biopsy. 2. Findings most suggestive of worsening congestive heart failure, now with small to moderate-sized bilateral effusions, moderate-sized pericardial effusion and diffuse body wall anasarca. Further evaluation with cardiac echo could be performed as clinically indicated. 3. Worsening bilateral lobe atelectasis/collapse, right greater than left, likely atelectasis. 4. Moderate (approximately 40%) compression deformity involving the L1 vertebral body, unchanged compared to lumbar spine CT performed 08/09/2019. 5. Cholelithiasis without evidence of cholecystitis. Electronically Signed   By: Sandi Mariscal M.D.   On: 08/14/2019 08:43   CT CERVICAL SPINE WO CONTRAST  Result Date: 08/13/2019 CLINICAL  DATA:  Spinal stenosis. EXAM: CT CERVICAL SPINE WITHOUT CONTRAST TECHNIQUE: Multidetector CT imaging of the cervical spine was performed without intravenous contrast. Multiplanar CT image reconstructions were also generated. COMPARISON:  None. FINDINGS: Alignment: Normal Skull base and vertebrae: Negative for fracture or mass. Soft tissues and spinal canal: Spinal canal normal in size. No significant spinal stenosis. Disc levels: Disc spaces well maintained without significant disc space narrowing. Small central disc protrusions at C3-4 and C4-5 without significant stenosis of the canal. No significant foraminal encroachment. Upper chest: Bilateral pleural effusions  right greater than left. Visualized lung in the apices is clear bilaterally. Other: None IMPRESSION: Negative for spinal stenosis. No acute abnormality in the cervical spine. Mild degenerative changes. Bilateral pleural effusions, right greater than left. Electronically Signed   By: Franchot Gallo M.D.   On: 08/13/2019 17:20   VAS [place] UPPER EXT VEIN MAPPING (PRE-OP AVF)  Result Date: 08/13/2019 UPPER EXTREMITY VEIN MAPPING  Indications: History of PAD; patient is pre-operative for bypass. Comparison Study: no prior Performing Technologist: Abram Sander RVS  Examination Guidelines: A complete evaluation includes B-mode imaging, spectral Doppler, color Doppler, and power Doppler as needed of all accessible portions of each vessel. Bilateral testing is considered an integral part of a complete examination. Limited examinations for reoccurring indications may be performed as noted. +-----------------+-------------+----------+--------+ Right Cephalic   Diameter (cm)Depth (cm)Findings +-----------------+-------------+----------+--------+ Shoulder             0.25        0.58            +-----------------+-------------+----------+--------+ Prox upper arm       0.26        0.78            +-----------------+-------------+----------+--------+  Mid upper arm        0.28        0.53            +-----------------+-------------+----------+--------+ Dist upper arm       0.29        0.32            +-----------------+-------------+----------+--------+ Antecubital fossa    0.50        0.35            +-----------------+-------------+----------+--------+ Prox forearm         0.23        0.44            +-----------------+-------------+----------+--------+ Mid forearm          0.23        0.28            +-----------------+-------------+----------+--------+ Dist forearm         0.22        0.29            +-----------------+-------------+----------+--------+ +-----------------+-------------+----------+--------+ Right Basilic    Diameter (cm)Depth (cm)Findings +-----------------+-------------+----------+--------+ Prox upper arm       0.38        0.72            +-----------------+-------------+----------+--------+ Mid upper arm        0.33        0.68            +-----------------+-------------+----------+--------+ Dist upper arm       0.39        0.75            +-----------------+-------------+----------+--------+ Antecubital fossa    0.41        0.64            +-----------------+-------------+----------+--------+ Prox forearm         0.20        0.22            +-----------------+-------------+----------+--------+ Mid forearm          0.16        0.22            +-----------------+-------------+----------+--------+ Distal forearm       0.28        0.19            +-----------------+-------------+----------+--------+ +-----------------+-------------+----------+--------+  Left Cephalic    Diameter (cm)Depth (cm)Findings +-----------------+-------------+----------+--------+ Shoulder             0.34        0.57            +-----------------+-------------+----------+--------+ Prox upper arm       0.34        0.55            +-----------------+-------------+----------+--------+ Mid  upper arm        0.30        0.31            +-----------------+-------------+----------+--------+ Dist upper arm       0.32        0.34            +-----------------+-------------+----------+--------+ Antecubital fossa    0.53        0.23            +-----------------+-------------+----------+--------+ Prox forearm         0.29        0.45            +-----------------+-------------+----------+--------+ Mid forearm          0.22        0.23            +-----------------+-------------+----------+--------+ Dist forearm         0.20        0.28            +-----------------+-------------+----------+--------+ +-----------------+-------------+----------+--------+ Left Basilic     Diameter (cm)Depth (cm)Findings +-----------------+-------------+----------+--------+ Prox upper arm       0.35        0.69            +-----------------+-------------+----------+--------+ Mid upper arm        0.34        0.66            +-----------------+-------------+----------+--------+ Dist upper arm       0.32        0.77            +-----------------+-------------+----------+--------+ Antecubital fossa    0.25        0.47            +-----------------+-------------+----------+--------+ Prox forearm         0.18        0.25            +-----------------+-------------+----------+--------+ Mid forearm          0.21        0.24            +-----------------+-------------+----------+--------+ Distal forearm       0.20        0.16            +-----------------+-------------+----------+--------+ *See table(s) above for measurements and observations.   Diagnosing physician: Servando Snare MD Electronically signed by Servando Snare MD on 08/13/2019 at 2:48:03 PM.    Final     Scheduled Meds: . amLODipine  5 mg Oral Daily  . carvedilol  6.25 mg Oral BID WC  . Chlorhexidine Gluconate Cloth  6 each Topical Q0600  . darbepoetin (ARANESP) injection - DIALYSIS  100 mcg Intravenous Q  Wed-HD  . gabapentin  100 mg Oral BID  . insulin aspart  0-9 Units Subcutaneous TID WC  . insulin aspart protamine- aspart  15 Units Subcutaneous TID AC  . levothyroxine  25 mcg Oral Daily  . pantoprazole  40 mg Oral QHS  . pravastatin  40 mg Oral q1800  . sodium bicarbonate  1,300 mg Oral TID   Continuous Infusions: . sodium chloride    . sodium chloride    . ferric gluconate (FERRLECIT/NULECIT) IV       LOS: 6 days   Time spent: 30 minutes   Darliss Cheney, MD [facility]  08/14/2019, 9:56 AM   To contact the attending provider between 7A-7P or the covering provider during after hours 7P-7A, please log into the web site [email or web address].

## 2019-08-15 LAB — RENAL FUNCTION PANEL
Albumin: 1.9 g/dL — ABNORMAL LOW (ref 3.5–5.0)
Anion gap: 13 (ref 5–15)
BUN: 67 mg/dL — ABNORMAL HIGH (ref 6–20)
CO2: 22 mmol/L (ref 22–32)
Calcium: 7.6 mg/dL — ABNORMAL LOW (ref 8.9–10.3)
Chloride: 95 mmol/L — ABNORMAL LOW (ref 98–111)
Creatinine, Ser: 5.97 mg/dL — ABNORMAL HIGH (ref 0.44–1.00)
GFR calc Af Amer: 9 mL/min — ABNORMAL LOW (ref >60)
GFR calc non Af Amer: 8 mL/min — ABNORMAL LOW (ref >60)
Glucose, Bld: 240 mg/dL — ABNORMAL HIGH (ref 70–99)
Phosphorus: 6.6 mg/dL — ABNORMAL HIGH (ref 2.5–4.6)
Potassium: 3.8 mmol/L (ref 3.5–5.1)
Sodium: 130 mmol/L — ABNORMAL LOW (ref 135–145)

## 2019-08-15 LAB — CBC
HCT: 22.9 % — ABNORMAL LOW (ref 36.0–46.0)
Hemoglobin: 7.4 g/dL — ABNORMAL LOW (ref 12.0–15.0)
MCH: 30.2 pg (ref 26.0–34.0)
MCHC: 32.3 g/dL (ref 30.0–36.0)
MCV: 93.5 fL (ref 80.0–100.0)
Platelets: 158 10*3/uL (ref 150–400)
RBC: 2.45 MIL/uL — ABNORMAL LOW (ref 3.87–5.11)
RDW: 16.9 % — ABNORMAL HIGH (ref 11.5–15.5)
WBC: 7.8 10*3/uL (ref 4.0–10.5)
nRBC: 0 % (ref 0.0–0.2)

## 2019-08-15 LAB — GLUCOSE, CAPILLARY
Glucose-Capillary: 127 mg/dL — ABNORMAL HIGH (ref 70–99)
Glucose-Capillary: 209 mg/dL — ABNORMAL HIGH (ref 70–99)
Glucose-Capillary: 217 mg/dL — ABNORMAL HIGH (ref 70–99)
Glucose-Capillary: 227 mg/dL — ABNORMAL HIGH (ref 70–99)

## 2019-08-15 LAB — HEPATITIS B SURFACE ANTIGEN: Hepatitis B Surface Ag: NONREACTIVE

## 2019-08-15 MED ORDER — HEPARIN SODIUM (PORCINE) 1000 UNIT/ML DIALYSIS: 1000.0000 [IU] | INTRAMUSCULAR | Status: DC | PRN

## 2019-08-15 MED ORDER — LIDOCAINE HCL (PF) 1 % IJ SOLN: 5.0000 mL | INTRAMUSCULAR | Status: DC | PRN

## 2019-08-15 MED ORDER — ALBUMIN HUMAN 25 % IV SOLN
INTRAVENOUS | Status: AC
  Administered 2019-08-15: 50 g via INTRAVENOUS
  Filled 2019-08-15: qty 200

## 2019-08-15 MED ORDER — SODIUM CHLORIDE 0.9 % IV SOLN: 100.0000 mL | INTRAVENOUS | Status: DC | PRN

## 2019-08-15 MED ORDER — PENTAFLUOROPROP-TETRAFLUOROETH EX AERO: 1.0000 "application " | INHALATION_SPRAY | CUTANEOUS | Status: DC | PRN

## 2019-08-15 MED ORDER — HEPARIN SODIUM (PORCINE) 1000 UNIT/ML IJ SOLN
INTRAMUSCULAR | Status: AC
  Administered 2019-08-15: 3200 [IU] via INTRAVENOUS_CENTRAL
  Filled 2019-08-15: qty 4

## 2019-08-15 MED ORDER — DARBEPOETIN ALFA 100 MCG/0.5ML IJ SOSY: 100.0000 ug | PREFILLED_SYRINGE | INTRAMUSCULAR | Status: DC

## 2019-08-15 MED ORDER — MIDODRINE HCL 5 MG PO TABS: 10.0000 mg | ORAL_TABLET | Freq: Once | ORAL | Status: DC

## 2019-08-15 MED ORDER — MIDODRINE HCL 5 MG PO TABS
ORAL_TABLET | ORAL | Status: AC
  Filled 2019-08-15: qty 2

## 2019-08-15 MED ORDER — ALBUMIN HUMAN 25 % IV SOLN: 50.0000 g | Freq: Once | INTRAVENOUS | Status: AC

## 2019-08-15 MED ORDER — ALTEPLASE 2 MG IJ SOLR: 2.0000 mg | Freq: Once | INTRAMUSCULAR | Status: DC | PRN

## 2019-08-15 MED ORDER — ONDANSETRON HCL 4 MG/2ML IJ SOLN
INTRAMUSCULAR | Status: AC
  Filled 2019-08-15: qty 2

## 2019-08-15 MED ORDER — LIDOCAINE-PRILOCAINE 2.5-2.5 % EX CREA: 1.0000 "application " | TOPICAL_CREAM | CUTANEOUS | Status: DC | PRN

## 2019-08-15 NOTE — Progress Notes (Signed)
PROGRESS NOTE    Melanie Hall  XHB:[id number] DOB: [birth year] DOA: 08/08/2019 PCP: [facility] [place]   Brief Narrative:  Melanie Hall is a 49 y.o. female with medical history significant of chronic kidney disease stage V, hypertension, hyperlipidemia, diabetes mellitus type 2 who presented to the [facility] with intermittent chest pain, uncontrolled hypertension. Patient follows with nephrology as outpatient and was supposed to get an Epogen shot but was told she cannot get it as her blood pressure is over 810 systolic.  She complained of a gradual onset diffuse headache as well as right arm weakness which is been there for the past 2 weeks. She also has intermittent chest pain on the right side of her chest that comes and goes lasting for several minutes to hours at a time.here are some associated shortness of breath.  No PND.  No fever, chills, cough.  Upon arrival to ED, she was hemodynamically stable.  All the rest of the labs were fairly stable except her creatinine was 6.4, slightly worse from 5.19 on 07/23/2019.  Due to strokelike symptoms, she ended up having CT of the head which was unremarkable and subsequently she was transferred to [facility] in order to get MRI.  MRI was unremarkable.  Patient then started having worsening renal function.  Nephrology was consulted.  She started having uremic symptoms.  Nephrology consulted IR.  Patient underwent tunneled HD catheter on 08/11/2019 and then had renal biopsy today.  Patient had CT abdomen and pelvis on 08/14/2019 which showed possibility of pericardial effusion.  Stat transthoracic echo was obtained which showed mild pericardial effusion and no tamponade.  Vascular surgery was consulted for AV fistula.  Assessment & Plan:   Principal Problem:   Acute on chronic renal failure (HCC) Active Problems:   Type 2 diabetes with nephropathy (HCC)   HYPERCHOLESTEROLEMIA   GERD (gastroesophageal reflux disease)   MGUS (monoclonal gammopathy  of unknown significance)   Accelerated hypertension   Chest pain   Right arm weakness   Right arm weakness/headache: Patient had negative CT head as well as MRI of the brain.  Patient today started having some right forearm " numbness" after renal biopsy.  I was informed about this by radiologist.  Radiologist also told me that patients are asked to keep their arms up while the biopsy is done and patients often have numbness because of this.  Patient continued to complain of right hand numbness for 2 days on February 3 and 4 however while she was also complaining of numbness, she also stated that she can feel the touch so there was no sensory loss.  Out of concern of possible cervical spinal stenosis, CT cervical spine was obtained which was unremarkable.  She did not complain of any numbness or weakness today.   Acute kidney injury on chronic kidney disease stage IV/progressive chronic kidney disease/uremia: Creatinine continues to get worse.  She came in with 6.40 >6.76>7.0> 7.54> 8.08.  She has signs of rapidly progressive chronic kidney disease.  Status post HD tunneled catheter placement on 08/11/2019 and renal biopsy on 08/12/2019.  Started dialysis on 08/12/2019.  Vascular surgery consulted by nephrology.  Plan for AV fistula on Monday.  Diarrhea: Patient complains of having intermittent diarrhea for the past 3 months.  No mention of diarrhea in H&P.  Not sure if she ever mention to anybody else. She has not had any bowel movement in last 2 to 3 days. C. difficile, GI pathogen and ova and parasite were  ordered but were never collected due to her not having any bowel movement.  Precautions will be discontinued today.  Acute on chronic anemia of chronic disease: Hemoglobin dropped from 7.5-6.2. She received 1 unit of PRBC transfusion on 2019-08-09. Posttransfusion hemoglobin 8.0 and then dropped back to 7.3> 7.7 . Iron studies indicate anemia of chronic disease.  Dropped hemoglobin again to 6.6 on  08/13/2019 and received 1 unit of PRBC with hemodialysis.  Hemoglobin posttransfusion 7.7> 7.4 today.  Nephrology ordered CT abdomen pelvis which showed small 3.6 cm crescentic perinephric hematoma following renal biopsy.  Small pericardial effusion: Incidental finding.  Ruled out tamponade.  Monitor.  Should improve with continuous hemodialysis.  Lower extremity weakness bilateral/peripheral neuropathy: patient complains of bilateral lower extremity weakness however on exam she has 5 out of 5 power and some decreased sensation however she carries a history of peripheral neuropathy and she is on gabapentin for a long time.  This is one of her chronic subjective complaint as well.  Acute on chronic low back pain: Patient complains of low back pain which is actually chronic but has been worse now.  Also complains of bilateral hip pain.  She also told me that she sometimes cannot feel when she has to pass stool and she has incontinence.  Denied any saddle anesthesia or any lower extremity weakness. Due to concern of spinal stenosis, CT lumbar spine was obtained which showed L1 compression fracture is stable from the skeletal survey earlier this month. 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 And there is posttraumatic or degenerative gas in the adjacent L1-L2 disc. No significant retropulsion of bone or complicating features.  Uncontrolled essential hypertension: Blood pressure mostly controlled but fluctuates. Continue home dose of amlodipine and carvedilol.  Type 2 diabetes mellitus: Takes NovoLog 70/30 15 units 3 times daily at home.  Her sugar fluctuates with few readings of mild hypoglycemia.  Continue current management and monitor as she also has been having some high blood sugar with this regimen.  Hyperlipidemia: Continue lovastatin.  Chronic bilateral pleural effusion: No hypoxia.  Monitor.  Chronic nonanion gap metabolic acidosis: Worsening, likely  secondary to worsening renal function.  Nephrology on board.  Treat with dialysis.  Monoclonal gammopathy of undetermined significance: Patient is being followed by Dr. Delton Coombes from oncology service as outpatient. Recently noted to have nephrotic range proteinuria. Also found to have M spike of 0.2 g. Light chain ratio is mildly elevated at 2.36.  Plan was to have a skeletal survey and a repeat of myeloma labs including immunofixation.  Hypothyroidism: Continue Synthroid.  GERD: Continue PPI  Migraine headache: Resolved.  DVT prophylaxis: SCD.  No heparin due to acute blood loss anemia. Code Status: Full code Family Communication:  None present at bedside.  Plan of care discussed with patient in length and he verbalized understanding and agreed with it. Patient came from: Home Disposition Plan: Will be discharged home with home health once improved and cleared by nephrology. Barriers to discharge: Medical improvement and need for dialysis and AV fistula planned on 08/17/2019.  Estimated body mass index is 32.06 kg/m as calculated from the following:   Height as of this encounter: 5\' 3"  (1.6 m).   Weight as of this encounter: 82.1 kg.      Nutritional status:               Consultants:   Nephrology/IR/vascular surgery  Procedures:   Tunneled HD catheter placement on 08/11/2019  Renal biopsy done on  08/12/2019  Antimicrobials:   None   Subjective: Seen and examined in dialysis unit today.  Complains of nausea and vomiting this morning.  No other complaint.  Objective: Vitals:   08/15/19 1000 08/15/19 1030 08/15/19 1049 08/15/19 1145  BP: 98/73 100/60 120/77 140/77  Pulse: 66 68 71 71  Resp:   19 16  Temp:   (!) 97.5 F (36.4 C) 97.7 F (36.5 C)  TempSrc:   Oral Oral  SpO2:   98% 98%  Weight:      Height:        Intake/Output Summary (Last 24 hours) at 08/15/2019 1207 Last data filed at 08/15/2019 1049 Gross per 24 hour  Intake 110 ml  Output 1906  ml  Net -1796 ml   Filed Weights   08/13/19 1015 08/15/19 0500 08/15/19 0715  Weight: 78.4 kg 78.6 kg 82.1 kg    Examination:  General exam: Appears calm and comfortable  Respiratory system: Clear to auscultation. Respiratory effort normal. Cardiovascular system: S1 & S2 heard, RRR. No JVD, murmurs, rubs, gallops or clicks. No pedal edema. Gastrointestinal system: Abdomen is nondistended, soft and nontender. No organomegaly or masses felt. Normal bowel sounds heard. Central nervous system: Alert and oriented. No focal neurological deficits. Extremities: Symmetric 5 x 5 power. Skin: No rashes, lesions or ulcers.  Psychiatry: Judgement and insight appear poor. Mood & affect flat   Data Reviewed: I have personally reviewed following labs and imaging studies  CBC: Recent Labs  Lab 08/08/19 1607 08/09/19 0829 08/12/19 0705 08/13/19 0721 08/13/19 1706 08/14/19 0755 08/15/19 0737  WBC 11.7*   < > 7.0 7.0 7.5 7.7 7.8  NEUTROABS 8.8*  --   --   --   --   --   --   HGB 7.5*   < > 7.8* 6.6* 8.3* 7.7* 7.4*  HCT 22.9*   < > 24.2* 20.6* 24.6* 23.6* 22.9*  MCV 94.6   < > 94.2 94.9 88.5 92.2 93.5  PLT 193   < > 173 171 170 165 158   < > = values in this interval not displayed.   Basic Metabolic Panel: Recent Labs  Lab 08/09/19 0829 08/10/19 8144 08/11/19 0522 08/12/19 0705 08/13/19 0721 08/14/19 0755 08/15/19 0736  NA 136   < > 133* 136 136 136 130*  K 4.0   < > 4.2 4.7 4.0 3.5 3.8  CL 107   < > 106 102 104 100 95*  CO2 15*   < > 12* 13* 18* 26 22  GLUCOSE 170*   < > 158* 149* 125* 134* 240*  BUN 73*   < > 96* 101* 77* 47* 67*  CREATININE 6.76*   < > 7.54* 8.08* 6.90* 4.89* 5.97*  CALCIUM 7.7*   < > 7.3* 7.8* 7.6* 7.6* 7.6*  MG 1.7  --   --   --   --   --   --   PHOS  --   --   --   --  7.0*  --  6.6*   < > = values in this interval not displayed.   GFR: Estimated Creatinine Clearance: 11.6 mL/min (A) (by C-G formula based on SCr of 5.97 mg/dL (H)). Liver Function  Tests: Recent Labs  Lab 08/08/19 1607 08/13/19 0721 08/15/19 0736  AST 52*  --   --   ALT 47*  --   --   ALKPHOS 82  --   --   BILITOT 1.0  --   --  PROT 6.4*  --   --   ALBUMIN 2.5* 1.9* 1.9*   Recent Labs  Lab 08/08/19 1607  LIPASE 17   No results for input(s): AMMONIA in the last 168 hours. Coagulation Profile: Recent Labs  Lab 08/11/19 0522  INR 1.2   Cardiac Enzymes: No results for input(s): CKTOTAL, CKMB, CKMBINDEX, TROPONINI in the last 168 hours. BNP (last 3 results) No results for input(s): PROBNP in the last 8760 hours. HbA1C: No results for input(s): HGBA1C in the last 72 hours. CBG: Recent Labs  Lab 08/14/19 1856 08/14/19 1916 08/14/19 2019 08/15/19 0036 08/15/19 0609  GLUCAP 58* 65* 189* 127* 217*   Lipid Profile: No results for input(s): CHOL, HDL, LDLCALC, TRIG, CHOLHDL, LDLDIRECT in the last 72 hours. Thyroid Function Tests: No results for input(s): TSH, T4TOTAL, FREET4, T3FREE, THYROIDAB in the last 72 hours. Anemia Panel: No results for input(s): VITAMINB12, FOLATE, FERRITIN, TIBC, IRON, RETICCTPCT in the last 72 hours. Sepsis Labs: No results for input(s): PROCALCITON, LATICACIDVEN in the last 168 hours.  Recent Results (from the past 240 hour(s))  Respiratory Panel by RT PCR (Flu A&B, Covid) - Nasopharyngeal Swab     Status: None   Collection Time: 08/08/19  5:50 PM   Specimen: Nasopharyngeal Swab  Result Value Ref Range Status   SARS Coronavirus 2 by RT PCR NEGATIVE NEGATIVE Final    Comment: (NOTE) SARS-CoV-2 target nucleic acids are NOT DETECTED. The SARS-CoV-2 RNA is generally detectable in upper respiratoy specimens during the acute phase of infection. The lowest concentration of SARS-CoV-2 viral copies this assay can detect is 131 copies/mL. A negative result does not preclude SARS-Cov-2 infection and should not be used as the sole basis for treatment or other patient management decisions. A negative result may occur with    improper specimen collection/handling, submission of specimen other than nasopharyngeal swab, presence of viral mutation(s) within the areas targeted by this assay, and inadequate number of viral copies (<131 copies/mL). A negative result must be combined with clinical observations, patient history, and epidemiological information. The expected result is Negative. Fact Sheet for Patients:  PinkCheek.be Fact Sheet for Healthcare Providers:  GravelBags.it This test is not yet ap proved or cleared by the [place] FDA and  has been authorized for detection and/or diagnosis of SARS-CoV-2 by FDA under an Emergency Use Authorization (EUA). This EUA will remain  in effect (meaning this test can be used) for the duration of the COVID-19 declaration under Section 564(b)(1) of the Act, 21 U.S.C. section 360bbb-3(b)(1), unless the authorization is terminated or revoked sooner.    Influenza A by PCR NEGATIVE NEGATIVE Final   Influenza B by PCR NEGATIVE NEGATIVE Final    Comment: (NOTE) The Xpert Xpress SARS-CoV-2/FLU/RSV assay is intended as an aid in  the diagnosis of influenza from Nasopharyngeal swab specimens and  should not be used as a sole basis for treatment. Nasal washings and  aspirates are unacceptable for Xpert Xpress SARS-CoV-2/FLU/RSV  testing. Fact Sheet for Patients: PinkCheek.be Fact Sheet for Healthcare Providers: GravelBags.it This test is not yet approved or cleared by the [place] FDA and  has been authorized for detection and/or diagnosis of SARS-CoV-2 by  FDA under an Emergency Use Authorization (EUA). This EUA will remain  in effect (meaning this test can be used) for the duration of the  Covid-19 declaration under Section 564(b)(1) of the Act, 21  U.S.C. section 360bbb-3(b)(1), unless the authorization is  terminated or revoked. Performed at  [facility], [phone]  [address] [place], [place] [address]       Radiology Studies: CT ABDOMEN PELVIS WO CONTRAST  Result Date: 08/14/2019 CLINICAL DATA:  Evaluate for retroperitoneal hematoma. Recent left-sided ultrasound-guided random renal biopsy. EXAM: CT ABDOMEN AND PELVIS WITHOUT CONTRAST TECHNIQUE: Multidetector CT imaging of the abdomen and pelvis was performed following the standard protocol without IV contrast. COMPARISON:  Ultrasound-guided left-sided random renal biopsy-08/12/2019; CT abdomen and pelvis-11/03/2018; 08/26/2018; lumbar spine CT -08/09/2019 FINDINGS: The lack of intravenous contrast limits the ability to evaluate solid abdominal organs. Examination is further degraded secondary to patient's overlying upper extremities and support apparatus Lower chest: Limited visualization of the lower thorax demonstrates small to moderate-sized bilateral pleural effusions with associated bilateral lower lobe atelectasis/collapse with associated air bronchograms, right greater than left. Borderline cardiomegaly. There is diffuse decreased attenuation of the intra cardiac blood pool suggestive of anemia. Interval development of a small moderate-sized pericardial effusion. Dialysis catheter tips terminate within the superior cavoatrial junction. Hepatobiliary: Hepatomegaly with mild nodularity hepatic contour, unchanged. Layering hyperattenuating gallstones are seen within otherwise normal-appearing gallbladder. No discrete gallbladder wall thickening or pericholecystic stranding on this noncontrast examination. Pancreas: Normal noncontrast appearance of the pancreas. Spleen: Normal noncontrast appearance of the spleen. Adrenals/Urinary Tract: Note is made of a small left-sided perinephric hematoma, the sequela of recent ultrasound-guided random renal biopsy. While exact measurements are difficult secondary to the crescentic nature of the hematoma, and is estimated to measure approximately 3.6 x 3.6 x  3.0 cm (coronal image 59, series 15; axial image 50, series 12). Otherwise, normal noncontrast appearance of the bilateral kidneys. No renal stones. No renal stones are seen along expected course of either ureter or the urinary bladder. Normal appearance of the urinary bladder given degree distention. No urinary obstruction or perinephric stranding. Normal noncontrast appearance the bilateral adrenal glands. Stomach/Bowel: Moderate colonic stool burden without evidence of enteric obstruction. Normal noncontrast appearance of the terminal ileum and the retrocecal appendix. No hiatal hernia. No pneumoperitoneum, pneumatosis or portal venous gas. Vascular/Lymphatic: Crescentic atherosclerotic plaque most conspicuous within the splenic artery, SMA and IMA as well as the bilateral superficial femoral arteries. Normal caliber the abdominal aorta No bulky retroperitoneal, mesenteric, pelvic or inguinal lymphadenopathy on this noncontrast examination. Reproductive: Normal noncontrast appearance of the pelvic organs. There is a small amount of free fluid in the pelvic cul-de-sac. Other: Worsening diffuse body wall anasarca. Musculoskeletal: Moderate (approximately 40%) compression deformity involving the L1 vertebral body without associated retropulsion, grossly unchanged compared to lumbar spine MRI performed 08/09/2019. Ex vacuo disc phenomenon involving the T12-L1 and L1-L2 intervertebral disc spaces. IMPRESSION: 1. Small (approximately 3.6 cm) crescentic perinephric hematoma following recent ultrasound-guided left renal biopsy. 2. Findings most suggestive of worsening congestive heart failure, now with small to moderate-sized bilateral effusions, moderate-sized pericardial effusion and diffuse body wall anasarca. Further evaluation with cardiac echo could be performed as clinically indicated. 3. Worsening bilateral lobe atelectasis/collapse, right greater than left, likely atelectasis. 4. Moderate (approximately 40%)  compression deformity involving the L1 vertebral body, unchanged compared to lumbar spine CT performed 08/09/2019. 5. Cholelithiasis without evidence of cholecystitis. Electronically Signed   By: Sandi Mariscal M.D.   On: 08/14/2019 08:43   CT CERVICAL SPINE WO CONTRAST  Result Date: 08/13/2019 CLINICAL DATA:  Spinal stenosis. EXAM: CT CERVICAL SPINE WITHOUT CONTRAST TECHNIQUE: Multidetector CT imaging of the cervical spine was performed without intravenous contrast. Multiplanar CT image reconstructions were also generated. COMPARISON:  None. FINDINGS: Alignment: Normal Skull base and vertebrae: Negative for fracture or mass. Soft  tissues and spinal canal: Spinal canal normal in size. No significant spinal stenosis. Disc levels: Disc spaces well maintained without significant disc space narrowing. Small central disc protrusions at C3-4 and C4-5 without significant stenosis of the canal. No significant foraminal encroachment. Upper chest: Bilateral pleural effusions right greater than left. Visualized lung in the apices is clear bilaterally. Other: None IMPRESSION: Negative for spinal stenosis. No acute abnormality in the cervical spine. Mild degenerative changes. Bilateral pleural effusions, right greater than left. Electronically Signed   By: Franchot Gallo M.D.   On: 08/13/2019 17:20   ECHOCARDIOGRAM COMPLETE  Result Date: 08/14/2019   ECHOCARDIOGRAM REPORT   Patient Name:   Melanie Hall Date of Exam: 08/14/2019 Medical Rec #:  [id number]     Height:       63.0 in Accession #:    [id number]    Weight:       172.8 lb Date of Birth:  July 20, 1970     BSA:          1.82 m Patient Age:    32 years      BP:           156/82 mmHg Patient Gender: F             HR:           58 bpm. Exam Location:  Inpatient Procedure: 2D Echo STAT ECHO Indications:    Pericardial effusion 423.9 / I31.3  History:        Patient has prior history of Echocardiogram examinations, most                 recent 08/09/2019. Risk  Factors:Hypertension and Diabetes.                 Chronic kidney disease.  Sonographer:    Darlina Sicilian RDCS Referring Phys: [id number] [place]  1. Small pericardial effusion, measures up to 30mm adjacent to RV. IVC is fixed/dilated, and there is significant mitral respiratory inflow variation, however no RA/RV collapse is seen to suggest tamponade.  2. Left ventricular ejection fraction, by visual estimation, is 55 to 60%. The left ventricle has normal function. There is moderately increased left ventricular hypertrophy.  3. Global right ventricle has normal systolic function.The right ventricular size is normal. .  4. Left atrial size was normal.  5. Right atrial size was normal.  6. The mitral valve is normal in structure. No evidence of mitral valve regurgitation.  7. The tricuspid valve is normal in structure. Tricuspid valve regurgitation is mild.  8. The aortic valve is tricuspid. Aortic valve regurgitation is not visualized. No evidence of aortic valve stenosis.  9. The pulmonic valve was not well visualized. Pulmonic valve regurgitation is trivial. 10. The inferior vena cava is dilated in size with <50% respiratory variability, suggesting right atrial pressure of 15 mmHg. 11. The tricuspid regurgitant velocity is 2.12 m/s, and with an assumed right atrial pressure of 15 mmHg, the estimated right ventricular systolic pressure is mildly elevated at 32.9 mmHg. FINDINGS  Left Ventricle: Left ventricular ejection fraction, by visual estimation, is 55 to 60%. The left ventricle has normal function. The left ventricle has no regional wall motion abnormalities. There is moderately increased left ventricular hypertrophy. Right Ventricle: The right ventricular size is normal. No increase in right ventricular wall thickness. Global RV systolic function is has normal systolic function. The tricuspid regurgitant velocity is 2.12 m/s, and with an assumed right atrial pressure  of 15  mmHg, the estimated  right ventricular systolic pressure is mildly elevated at 32.9 mmHg. Left Atrium: Left atrial size was normal in size. Right Atrium: Right atrial size was normal in size Pericardium: A small pericardial effusion is present. Small pericardial effusion, measures up to 15mm adjacent to RV. IVC is fixed/dilated, and there is significant mitral respiratory inflow variation, however no RA/RV collapse is seen to suggest tamponade. Mitral Valve: The mitral valve is normal in structure. No evidence of mitral valve regurgitation. Tricuspid Valve: The tricuspid valve is normal in structure. Tricuspid valve regurgitation is mild. Aortic Valve: The aortic valve is tricuspid. Aortic valve regurgitation is not visualized. The aortic valve is structurally normal, with no evidence of sclerosis or stenosis. Pulmonic Valve: The pulmonic valve was not well visualized. Pulmonic valve regurgitation is trivial. Pulmonic regurgitation is trivial. Aorta: The aortic root and ascending aorta are structurally normal, with no evidence of dilitation. Venous: The inferior vena cava is dilated in size with less than 50% respiratory variability, suggesting right atrial pressure of 15 mmHg. IAS/Shunts: The interatrial septum was not well visualized.  LEFT VENTRICLE PLAX 2D LVIDd:         3.70 cm  Diastology LVIDs:         2.70 cm  LV e' lateral:   5.63 cm/s LV PW:         1.00 cm  LV E/e' lateral: 14.1 LV IVS:        1.30 cm  LV e' medial:    5.03 cm/s LVOT diam:     1.70 cm  LV E/e' medial:  15.8 LV SV:         31 ml LV SV Index:   16.42 LVOT Area:     2.27 cm  RIGHT VENTRICLE RV S prime:     9.98 cm/s TAPSE (M-mode): 1.6 cm LEFT ATRIUM             Index       RIGHT ATRIUM           Index LA diam:        3.00 cm 1.65 cm/m  RA Area:     14.30 cm LA Vol (A2C):   39.9 ml 21.96 ml/m RA Volume:   37.40 ml  20.58 ml/m LA Vol (A4C):   39.5 ml 21.73 ml/m LA Biplane Vol: 41.0 ml 22.56 ml/m  AORTIC VALVE LVOT Vmax:   101.00 cm/s LVOT Vmean:  66.900  cm/s LVOT VTI:    0.150 m  AORTA Ao Root diam: 2.20 cm Ao Asc diam:  2.70 cm MITRAL VALVE                        TRICUSPID VALVE MV Area (PHT): 3.65 cm             TR Peak grad:   17.9 mmHg MV PHT:        60.32 msec           TR Vmax:        212.00 cm/s MV Decel Time: 208 msec MV E velocity: 79.40 cm/s 103 cm/s  SHUNTS MV A velocity: 63.70 cm/s 70.3 cm/s Systemic VTI:  0.15 m MV E/A ratio:  1.25       1.5       Systemic Diam: 1.70 cm  Oswaldo Milian MD Electronically signed by Oswaldo Milian MD Signature Date/Time: 08/14/2019/2:58:41 PM    Final     Scheduled Meds: . amLODipine  5 mg Oral Daily  . carvedilol  6.25 mg Oral BID WC  . Chlorhexidine Gluconate Cloth  6 each Topical Q0600  . Chlorhexidine Gluconate Cloth  6 each Topical Q0600  . darbepoetin (ARANESP) injection - DIALYSIS  100 mcg Intravenous Q Sat-HD  . ferric citrate  420 mg Oral TID WC  . gabapentin  100 mg Oral BID  . insulin aspart  0-9 Units Subcutaneous TID WC  . insulin aspart protamine- aspart  15 Units Subcutaneous TID AC  . levothyroxine  25 mcg Oral Daily  . midodrine      . midodrine  10 mg Oral Once  . pantoprazole  40 mg Oral QHS  . pravastatin  40 mg Oral q1800   Continuous Infusions: . sodium chloride    . sodium chloride    . ferric gluconate (FERRLECIT/NULECIT) IV 125 mg (08/14/19 1317)     LOS: 7 days   Time spent: 28 minutes   Darliss Cheney, MD [facility]  08/15/2019, 12:07 PM   To contact the attending provider between 7A-7P or the covering provider during after hours 7P-7A, please log into the web site [email or web address].

## 2019-08-15 NOTE — Progress Notes (Signed)
Called HD RN regarding 1200 aranesp injection  HD RN stated she will reach out to MD  Will continue to monitor

## 2019-08-15 NOTE — Procedures (Signed)
Patient was seen on dialysis and the procedure was supervised.  BFR 400  Via TDC BP is  90/68.  Patient is nauseated.  Order albumin, midodrine and lower the temperature of dialysate for hypotension.  Surgery was consulted for permanent access and plan for left AV fistula on Monday by Dr. Scot Dock.   Patient appears to be tolerating treatment well  Melanie Hall 08/15/2019

## 2019-08-16 LAB — BASIC METABOLIC PANEL
Anion gap: 13 (ref 5–15)
BUN: 35 mg/dL — ABNORMAL HIGH (ref 6–20)
CO2: 24 mmol/L (ref 22–32)
Calcium: 8.2 mg/dL — ABNORMAL LOW (ref 8.9–10.3)
Chloride: 97 mmol/L — ABNORMAL LOW (ref 98–111)
Creatinine, Ser: 4.17 mg/dL — ABNORMAL HIGH (ref 0.44–1.00)
GFR calc Af Amer: 14 mL/min — ABNORMAL LOW (ref >60)
GFR calc non Af Amer: 12 mL/min — ABNORMAL LOW (ref >60)
Glucose, Bld: 179 mg/dL — ABNORMAL HIGH (ref 70–99)
Potassium: 4.5 mmol/L (ref 3.5–5.1)
Sodium: 134 mmol/L — ABNORMAL LOW (ref 135–145)

## 2019-08-16 LAB — CBC
HCT: 25 % — ABNORMAL LOW (ref 36.0–46.0)
Hemoglobin: 7.9 g/dL — ABNORMAL LOW (ref 12.0–15.0)
MCH: 29.5 pg (ref 26.0–34.0)
MCHC: 31.6 g/dL (ref 30.0–36.0)
MCV: 93.3 fL (ref 80.0–100.0)
Platelets: 168 10*3/uL (ref 150–400)
RBC: 2.68 MIL/uL — ABNORMAL LOW (ref 3.87–5.11)
RDW: 16.6 % — ABNORMAL HIGH (ref 11.5–15.5)
WBC: 8 10*3/uL (ref 4.0–10.5)
nRBC: 0 % (ref 0.0–0.2)

## 2019-08-16 LAB — GLUCOSE, CAPILLARY
Glucose-Capillary: 160 mg/dL — ABNORMAL HIGH (ref 70–99)
Glucose-Capillary: 99 mg/dL (ref 70–99)
Glucose-Capillary: 98 mg/dL (ref 70–99)
Glucose-Capillary: 160 mg/dL — ABNORMAL HIGH (ref 70–99)

## 2019-08-16 LAB — SURGICAL PCR SCREEN
MRSA, PCR: NEGATIVE
Staphylococcus aureus: NEGATIVE

## 2019-08-16 MED ORDER — SODIUM CHLORIDE 0.9 % IV SOLN
1.5000 g | INTRAVENOUS | Status: AC
  Administered 2019-08-17: 06:00:00 1.5 g via INTRAVENOUS
  Filled 2019-08-16: qty 1.5

## 2019-08-16 NOTE — Progress Notes (Signed)
Informed MD of pt CBG and decreased appetite  MD aware no oral medications given   MD stated to hold 70/30 insulin  Will continue to monitor

## 2019-08-16 NOTE — Anesthesia Preprocedure Evaluation (Addendum)
Anesthesia Evaluation  Patient identified by MRN, date of birth, ID band Patient awake    Reviewed: Allergy & Precautions, NPO status , Patient's Chart, lab work & pertinent test results  History of Anesthesia Complications Negative for: history of anesthetic complications  Airway Mallampati: II  TM Distance: >3 FB Neck ROM: Full    Dental  (+) Dental Advisory Given   Pulmonary neg shortness of breath, neg sleep apnea, neg recent URI, former smoker,    breath sounds clear to auscultation       Cardiovascular hypertension, Pt. on medications  Rhythm:Regular     Neuro/Psych negative neurological ROS  negative psych ROS   GI/Hepatic GERD  Medicated and Controlled,  Endo/Other  diabetes, Type 2, Insulin Dependent  Renal/GU Renal disease     Musculoskeletal left distal femur fracture   Abdominal   Peds  Hematology  (+) anemia ,   Anesthesia Other Findings   Reproductive/Obstetrics                             Anesthesia Physical  Anesthesia Plan  ASA: III  Anesthesia Plan: MAC   Post-op Pain Management:    Induction: Intravenous  PONV Risk Score and Plan: 3 and Ondansetron and Dexamethasone  Airway Management Planned:   Additional Equipment: None  Intra-op Plan:   Post-operative Plan:   Informed Consent: I have reviewed the patients History and Physical, chart, labs and discussed the procedure including the risks, benefits and alternatives for the proposed anesthesia with the patient or authorized representative who has indicated his/her understanding and acceptance.     Dental advisory given  Plan Discussed with: CRNA  Anesthesia Plan Comments:        Anesthesia Quick Evaluation

## 2019-08-16 NOTE — H&P (View-Only) (Signed)
[facility]  Subjective  - nausea   Objective 102/79 72 98 F (36.7 C) (Oral) 17 100%  Intake/Output Summary (Last 24 hours) at 08/16/2019 1111 Last data filed at 08/16/2019 0749 Gross per 24 hour  Intake 960 ml  Output --  Net 960 ml    Assessment/Planning: Pt for left arm Fistula tomorrow by Dr Scot Dock Questions answered she is ready to proceed NPO p midnight, consent  Ruta Hinds 08/16/2019 11:11 AM --  Laboratory Lab Results: Recent Labs    08/15/19 0737 08/16/19 0224  WBC 7.8 8.0  HGB 7.4* 7.9*  HCT 22.9* 25.0*  PLT 158 168   BMET Recent Labs    08/15/19 0736 08/16/19 0224  NA 130* 134*  K 3.8 4.5  CL 95* 97*  CO2 22 24  GLUCOSE 240* 179*  BUN 67* 35*  CREATININE 5.97* 4.17*  CALCIUM 7.6* 8.2*    COAG Lab Results  Component Value Date   INR 1.2 08/11/2019   No results found for: PTT

## 2019-08-16 NOTE — Progress Notes (Signed)
Pt states she feels nauseous No emesis at this time  PRN medications given  Pt states "I don't want to refuse my medications but I cannot take them" Alternative therapies offered to pt, pt denied Call light and all belongings placed in reach, pt has emesis collection bag. Educated pt on importance of calling staff if she does produce emesis  Will continue to monitor

## 2019-08-16 NOTE — Progress Notes (Signed)
[facility] [place]  Subjective  - nausea   Objective 102/79 72 98 F (36.7 C) (Oral) 17 100%  Intake/Output Summary (Last 24 hours) at 08/16/2019 1111 Last data filed at 08/16/2019 0749 Gross per 24 hour  Intake 960 ml  Output -  Net 960 ml    Assessment/Planning: Pt for left arm Fistula tomorrow by Dr Scot Dock Questions answered she is ready to proceed NPO p midnight, consent  Ruta Hinds 08/16/2019 11:11 AM --  Laboratory Lab Results: Recent Labs    08/15/19 0737 08/16/19 0224  WBC 7.8 8.0  HGB 7.4* 7.9*  HCT 22.9* 25.0*  PLT 158 168   BMET Recent Labs    08/15/19 0736 08/16/19 0224  NA 130* 134*  K 3.8 4.5  CL 95* 97*  CO2 22 24  GLUCOSE 240* 179*  BUN 67* 35*  CREATININE 5.97* 4.17*  CALCIUM 7.6* 8.2*    COAG Lab Results  Component Value Date   INR 1.2 08/11/2019   No results found for: PTT

## 2019-08-16 NOTE — Progress Notes (Signed)
[facility] NEPHROLOGY PROGRESS NOTE  Assessment/ Plan: Pt is a 49 y.o. yo female  with history of hypertension, HLD, type II DM, MGUS, CKD with rapidly progressive renal failure.  TDC was placed on 2/2 and had first dialysis on 2/3.  The last dialysis was yesterday and  tolerated well.  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  Admitted with uremia, fluid overload and significant renal failure. With nephrotic syndrome, positive ANA patient underwent kidney biopsy on 2/3.  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 Diffuse severe IFTA superimposed on ATN.  No evidence of immune complex or active GN noted.  No evidence of monoclonal immunoglobin deposition.  EM study pending. -TDC was placed on 2/2 and had first dialysis on 2/3.  Vascular surgery was consulted and plan for AV fistula on Monday by Dr. Scot Dock. -Last HD yesterday with around 2000 cc UF.  Tolerated well.  Plan for next HD on 2/9.  #Anemia due to CKD and blood loss: Received blood transfusion.  CT scan of abdomen pelvis with mild perinephric myoma.  Avoid heparin.  Iron saturation 11%.  Started IV iron and ESA.  #Pericardial effusion/anasarca: The echocardiogram showed small effusion with no features of tamponade. We will try to UF during HD.  #CKD-MBD: Nona Dell.    #Hypertension: Continue current medication.  Monitor blood pressure.  Subjective: Seen and examined at bedside.  Complaining of nausea.  No chest pain, shortness of breath. Objective Vital signs in last 24 hours: Vitals:   08/16/19 0008 08/16/19 0500 08/16/19 0557 08/16/19 0843  BP: 118/74  113/73 102/79  Pulse: 74  77 72  Resp: 16  17   Temp: 99 F (37.2 C)  98 F (36.7 C)   TempSrc: Oral  Oral   SpO2: 99%  100%   Weight:  82 kg    Height:       Weight change: 3.5 kg  Intake/Output Summary  (Last 24 hours) at 08/16/2019 0944 Last data filed at 08/16/2019 0749 Gross per 24 hour  Intake 960 ml  Output 1906 ml  Net -946 ml       Labs: Basic Metabolic Panel: Recent Labs  Lab 08/13/19 0721 08/13/19 0721 08/14/19 0755 08/15/19 0736 08/16/19 0224  NA 136   < > 136 130* 134*  K 4.0   < > 3.5 3.8 4.5  CL 104   < > 100 95* 97*  CO2 18*   < > 26 22 24   GLUCOSE 125*   < > 134* 240* 179*  BUN 77*   < > 47* 67* 35*  CREATININE 6.90*   < > 4.89* 5.97* 4.17*  CALCIUM 7.6*   < > 7.6* 7.6* 8.2*  PHOS 7.0*  --   --  6.6*  --    < > = values in this interval not displayed.   Liver Function Tests: Recent Labs  Lab 08/13/19 0721 08/15/19 0736  ALBUMIN 1.9* 1.9*   No results for input(s): LIPASE, AMYLASE in the last 168 hours. No results for input(s): AMMONIA in the last 168 hours. CBC: Recent Labs  Lab 08/13/19 0721 08/13/19 0721 08/13/19 1706 08/13/19 1706 08/14/19 0755 08/15/19 0737 08/16/19 0224  WBC 7.0   < > 7.5   < > 7.7 7.8 8.0  HGB 6.6*   < > 8.3*   < > 7.7* 7.4* 7.9*  HCT 20.6*   < >  24.6*   < > 23.6* 22.9* 25.0*  MCV 94.9  --  88.5  --  92.2 93.5 93.3  PLT 171   < > 170   < > 165 158 168   < > = values in this interval not displayed.   Cardiac Enzymes: No results for input(s): CKTOTAL, CKMB, CKMBINDEX, TROPONINI in the last 168 hours. CBG: Recent Labs  Lab 08/15/19 0036 08/15/19 0609 08/15/19 1607 08/15/19 2135 08/16/19 0557  GLUCAP 127* 217* 227* 209* 160*    Iron Studies: No results for input(s): IRON, TIBC, TRANSFERRIN, FERRITIN in the last 72 hours. Studies/Results: ECHOCARDIOGRAM COMPLETE  Result Date: 08/14/2019   ECHOCARDIOGRAM REPORT   Patient Name:   Melanie Hall Date of Exam: 08/14/2019 Medical Rec #:  [id number]     Height:       63.0 in Accession #:    [id number]    Weight:       172.8 lb Date of Birth:  06/11/1971     BSA:          1.82 m Patient Age:    56 years      BP:           156/82 mmHg Patient Gender: F             HR:            58 bpm. Exam Location:  Inpatient Procedure: 2D Echo STAT ECHO Indications:    Pericardial effusion 423.9 / I31.3  History:        Patient has prior history of Echocardiogram examinations, most                 recent 08/09/2019. Risk Factors:Hypertension and Diabetes.                 Chronic kidney disease.  Sonographer:    Darlina Sicilian RDCS Referring Phys: [id number] [place]  1. Small pericardial effusion, measures up to 31mm adjacent to RV. IVC is fixed/dilated, and there is significant mitral respiratory inflow variation, however no RA/RV collapse is seen to suggest tamponade.  2. Left ventricular ejection fraction, by visual estimation, is 55 to 60%. The left ventricle has normal function. There is moderately increased left ventricular hypertrophy.  3. Global right ventricle has normal systolic function.The right ventricular size is normal. .  4. Left atrial size was normal.  5. Right atrial size was normal.  6. The mitral valve is normal in structure. No evidence of mitral valve regurgitation.  7. The tricuspid valve is normal in structure. Tricuspid valve regurgitation is mild.  8. The aortic valve is tricuspid. Aortic valve regurgitation is not visualized. No evidence of aortic valve stenosis.  9. The pulmonic valve was not well visualized. Pulmonic valve regurgitation is trivial. 10. The inferior vena cava is dilated in size with <50% respiratory variability, suggesting right atrial pressure of 15 mmHg. 11. The tricuspid regurgitant velocity is 2.12 m/s, and with an assumed right atrial pressure of 15 mmHg, the estimated right ventricular systolic pressure is mildly elevated at 32.9 mmHg. FINDINGS  Left Ventricle: Left ventricular ejection fraction, by visual estimation, is 55 to 60%. The left ventricle has normal function. The left ventricle has no regional wall motion abnormalities. There is moderately increased left ventricular hypertrophy. Right Ventricle: The right ventricular size is  normal. No increase in right ventricular wall thickness. Global RV systolic function is has normal systolic function. The tricuspid regurgitant velocity is 2.12 m/s, and with an  assumed right atrial pressure  of 15 mmHg, the estimated right ventricular systolic pressure is mildly elevated at 32.9 mmHg. Left Atrium: Left atrial size was normal in size. Right Atrium: Right atrial size was normal in size Pericardium: A small pericardial effusion is present. Small pericardial effusion, measures up to 32mm adjacent to RV. IVC is fixed/dilated, and there is significant mitral respiratory inflow variation, however no RA/RV collapse is seen to suggest tamponade. Mitral Valve: The mitral valve is normal in structure. No evidence of mitral valve regurgitation. Tricuspid Valve: The tricuspid valve is normal in structure. Tricuspid valve regurgitation is mild. Aortic Valve: The aortic valve is tricuspid. Aortic valve regurgitation is not visualized. The aortic valve is structurally normal, with no evidence of sclerosis or stenosis. Pulmonic Valve: The pulmonic valve was not well visualized. Pulmonic valve regurgitation is trivial. Pulmonic regurgitation is trivial. Aorta: The aortic root and ascending aorta are structurally normal, with no evidence of dilitation. Venous: The inferior vena cava is dilated in size with less than 50% respiratory variability, suggesting right atrial pressure of 15 mmHg. IAS/Shunts: The interatrial septum was not well visualized.  LEFT VENTRICLE PLAX 2D LVIDd:         3.70 cm  Diastology LVIDs:         2.70 cm  LV e' lateral:   5.63 cm/s LV PW:         1.00 cm  LV E/e' lateral: 14.1 LV IVS:        1.30 cm  LV e' medial:    5.03 cm/s LVOT diam:     1.70 cm  LV E/e' medial:  15.8 LV SV:         31 ml LV SV Index:   16.42 LVOT Area:     2.27 cm  RIGHT VENTRICLE RV S prime:     9.98 cm/s TAPSE (M-mode): 1.6 cm LEFT ATRIUM             Index       RIGHT ATRIUM           Index LA diam:        3.00 cm 1.65  cm/m  RA Area:     14.30 cm LA Vol (A2C):   39.9 ml 21.96 ml/m RA Volume:   37.40 ml  20.58 ml/m LA Vol (A4C):   39.5 ml 21.73 ml/m LA Biplane Vol: 41.0 ml 22.56 ml/m  AORTIC VALVE LVOT Vmax:   101.00 cm/s LVOT Vmean:  66.900 cm/s LVOT VTI:    0.150 m  AORTA Ao Root diam: 2.20 cm Ao Asc diam:  2.70 cm MITRAL VALVE                        TRICUSPID VALVE MV Area (PHT): 3.65 cm             TR Peak grad:   17.9 mmHg MV PHT:        60.32 msec           TR Vmax:        212.00 cm/s MV Decel Time: 208 msec MV E velocity: 79.40 cm/s 103 cm/s  SHUNTS MV A velocity: 63.70 cm/s 70.3 cm/s Systemic VTI:  0.15 m MV E/A ratio:  1.25       1.5       Systemic Diam: 1.70 cm  Oswaldo Milian MD Electronically signed by Oswaldo Milian MD Signature Date/Time: 08/14/2019/2:58:41 PM    Final  Medications: Infusions: . sodium chloride    . sodium chloride    . ferric gluconate (FERRLECIT/NULECIT) IV 125 mg (08/14/19 1317)    Scheduled Medications: . amLODipine  5 mg Oral Daily  . carvedilol  6.25 mg Oral BID WC  . Chlorhexidine Gluconate Cloth  6 each Topical Q0600  . Chlorhexidine Gluconate Cloth  6 each Topical Q0600  . [START ON 08/18/2019] darbepoetin (ARANESP) injection - DIALYSIS  100 mcg Intravenous Q Tue-HD  . ferric citrate  420 mg Oral TID WC  . gabapentin  100 mg Oral BID  . insulin aspart  0-9 Units Subcutaneous TID WC  . insulin aspart protamine- aspart  15 Units Subcutaneous TID AC  . levothyroxine  25 mcg Oral Daily  . midodrine  10 mg Oral Once  . pantoprazole  40 mg Oral QHS  . pravastatin  40 mg Oral q1800    have reviewed scheduled and prn medications.  Physical Exam: General:NAD, comfortable Heart:RRR, s1s2 nl Lungs:clear b/l, no crackle Abdomen:soft, Non-tender, non-distended Extremities: Lower extremity edema present Dialysis Access: Right IJ TDC.  Rubel Heckard Prasad [place]oll 08/16/2019,9:44 AM  LOS: 8 days  Pager: [phone]

## 2019-08-16 NOTE — Progress Notes (Signed)
PROGRESS NOTE    Melanie Hall  XMI:[id number] DOB: 08/18/1970 DOA: 08/08/2019 PCP: [facility], [place]   Brief Narrative:  Melanie Hall is a 49 y.o. female with medical history significant of chronic kidney disease stage V, hypertension, hyperlipidemia, diabetes mellitus type 2 who presented to the [facility] with intermittent chest pain, uncontrolled hypertension. Patient follows with nephrology as outpatient and was supposed to get an Epogen shot but was told she cannot get it as her blood pressure is over 825 systolic.  She complained of a gradual onset diffuse headache as well as right arm weakness which is been there for the past 2 weeks. She also has intermittent chest pain on the right side of her chest that comes and goes lasting for several minutes to hours at a time.here are some associated shortness of breath.  No PND.  No fever, chills, cough.  Upon arrival to ED, she was hemodynamically stable.  All the rest of the labs were fairly stable except her creatinine was 6.4, slightly worse from 5.19 on 07/23/2019.  Due to strokelike symptoms, she ended up having CT of the head which was unremarkable and subsequently she was transferred to [facility] in order to get MRI.  MRI was unremarkable.  Patient then started having worsening renal function.  Nephrology was consulted.  She started having uremic symptoms.  Nephrology consulted IR.  Patient underwent tunneled HD catheter on 08/11/2019 and then had renal biopsy today.  Patient had CT abdomen and pelvis on 08/14/2019 which showed possibility of pericardial effusion.  Stat transthoracic echo was obtained which showed mild pericardial effusion and no tamponade.  Vascular surgery was consulted for AV fistula.  Assessment & Plan:   Principal Problem:   Acute on chronic renal failure (HCC) Active Problems:   Type 2 diabetes with nephropathy (HCC)   HYPERCHOLESTEROLEMIA   GERD (gastroesophageal reflux disease)   MGUS (monoclonal gammopathy  of unknown significance)   Accelerated hypertension   Chest pain   Right arm weakness   Right arm weakness/headache: Patient had negative CT head as well as MRI of the brain.  Patient today started having some right forearm " numbness" after renal biopsy.  I was informed about this by radiologist.  Radiologist also told me that patients are asked to keep their arms up while the biopsy is done and patients often have numbness because of this.  Patient continued to complain of right hand numbness for 2 days on February 3 and 4 however while she was also complaining of numbness, she also stated that she can feel the touch so there was no sensory loss.  Out of concern of possible cervical spinal stenosis, CT cervical spine was obtained which was unremarkable.  She did not complain of any numbness or weakness today.   Acute kidney injury on chronic kidney disease stage IV/progressive chronic kidney disease/uremia: Creatinine continues to get worse.  She came in with 6.40 >6.76>7.0> 7.54> 8.08.  She has signs of rapidly progressive chronic kidney disease.  Status post HD tunneled catheter placement on 08/11/2019 and renal biopsy on 08/12/2019.  Started dialysis on 08/12/2019.  Vascular surgery consulted by nephrology.  Plan for AV fistula on Monday.  Diarrhea: Patient complains of having intermittent diarrhea for the past 3 months.  No mention of diarrhea in H&P.  Not sure if she ever mention to anybody else. She has not had any bowel movement in last 2 to 3 days. C. difficile, GI pathogen and ova and parasite were  ordered but were never collected due to her not having any bowel movement.   Acute on chronic anemia of chronic disease: Hemoglobin dropped from 7.5-6.2. She received 1 unit of PRBC transfusion on 2019-08-09. Posttransfusion hemoglobin 8.0 and then dropped back to 7.3> 7.7 . Iron studies indicate anemia of chronic disease.  Dropped hemoglobin again to 6.6 on 08/13/2019 and received 1 unit of PRBC with  hemodialysis.  Hemoglobin posttransfusion 7.7> 7.4 today.  Nephrology ordered CT abdomen pelvis which showed small 3.6 cm crescentic perinephric hematoma following renal biopsy.  Small pericardial effusion: Incidental finding on CT abdomen and pelvis.  Stat transthoracic echo obtained.  Ruled out tamponade.  Monitor.  Should improve with continuous hemodialysis.  Lower extremity weakness bilateral/peripheral neuropathy: patient complains of bilateral lower extremity weakness however on exam she has 5 out of 5 power and some decreased sensation however she carries a history of peripheral neuropathy and she is on gabapentin for a long time.  This is one of her chronic subjective complaint as well.  Acute on chronic low back pain: Patient complains of low back pain which is actually chronic but has been worse now.  Also complains of bilateral hip pain.  She also told me that she sometimes cannot feel when she has to pass stool and she has incontinence.  Denied any saddle anesthesia or any lower extremity weakness. Due to concern of spinal stenosis, CT lumbar spine was obtained which showed L1 compression fracture is stable from the skeletal survey earlier this month. 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 And there is posttraumatic or degenerative gas in the adjacent L1-L2 disc. No significant retropulsion of bone or complicating features.  Uncontrolled essential hypertension: Blood pressure mostly controlled but fluctuates. Continue home dose of amlodipine and carvedilol.  Type 2 diabetes mellitus: Takes NovoLog 70/30 15 units 3 times daily at home.  Her sugar fluctuates with few readings of mild hypoglycemia likely due to her refusal of food.  Does not want to eat lunch today.  Holding current dose of 7030.  Continue rest of the current management.  Hyperlipidemia: Continue lovastatin.  Chronic bilateral pleural effusion: No hypoxia.  Monitor.  Chronic  nonanion gap metabolic acidosis: Worsening, likely secondary to worsening renal function.  Nephrology on board.  Treat with dialysis.  Monoclonal gammopathy of undetermined significance: Patient is being followed by Dr. Delton Coombes from oncology service as outpatient. Recently noted to have nephrotic range proteinuria. Also found to have M spike of 0.2 g. Light chain ratio is mildly elevated at 2.36.  Plan was to have a skeletal survey and a repeat of myeloma labs including immunofixation.  Hypothyroidism: Continue Synthroid.  GERD: Continue PPI  Migraine headache: Continue Excedrin as needed  Nausea: May be due to uremia.  Continue as needed antiemetics.  DVT prophylaxis: SCD.  No heparin due to acute blood loss anemia. Code Status: Full code Family Communication:  None present at bedside.  Plan of care discussed with patient in length and he verbalized understanding and agreed with it. Patient came from: Home Disposition Plan: Will be discharged home with home health once improved and cleared by nephrology. Barriers to discharge: Medical improvement and need for dialysis and AV fistula planned on 08/17/2019.  Estimated body mass index is 32.02 kg/m as calculated from the following:   Height as of this encounter: 5\' 3"  (1.6 m).   Weight as of this encounter: 82 kg.      Nutritional status:  Consultants:   Nephrology/IR/vascular surgery  Procedures:   Tunneled HD catheter placement on 08/11/2019  Renal biopsy done on 08/12/2019  Antimicrobials:   None   Subjective: Seen and examined.  Complains of nausea but no vomiting.  Also complained of headache.  Has no other complaint today.  Objective: Vitals:   08/16/19 0008 08/16/19 0500 08/16/19 0557 08/16/19 0843  BP: 118/74  113/73 102/79  Pulse: 74  77 72  Resp: 16  17   Temp: 99 F (37.2 C)  98 F (36.7 C)   TempSrc: Oral  Oral   SpO2: 99%  100%   Weight:  82 kg    Height:         Intake/Output Summary (Last 24 hours) at 08/16/2019 1114 Last data filed at 08/16/2019 0749 Gross per 24 hour  Intake 960 ml  Output --  Net 960 ml   Filed Weights   08/15/19 0500 08/15/19 0715 08/16/19 0500  Weight: 78.6 kg 82.1 kg 82 kg    Examination:  General exam: Appears tired. Respiratory system: Clear to auscultation. Respiratory effort normal. Cardiovascular system: S1 & S2 heard, RRR. No JVD, murmurs, rubs, gallops or clicks. No pedal edema. Gastrointestinal system: Abdomen is nondistended, soft and nontender. No organomegaly or masses felt. Normal bowel sounds heard. Central nervous system: Alert and oriented. No focal neurological deficits. Extremities: Symmetric 5 x 5 power. Skin: No rashes, lesions or ulcers.  Psychiatry: Judgement and insight appear poor. Mood & affect flat.  Data Reviewed: I have personally reviewed following labs and imaging studies  CBC: Recent Labs  Lab 08/13/19 0721 08/13/19 1706 08/14/19 0755 08/15/19 0737 08/16/19 0224  WBC 7.0 7.5 7.7 7.8 8.0  HGB 6.6* 8.3* 7.7* 7.4* 7.9*  HCT 20.6* 24.6* 23.6* 22.9* 25.0*  MCV 94.9 88.5 92.2 93.5 93.3  PLT 171 170 165 158 128   Basic Metabolic Panel: Recent Labs  Lab 08/12/19 0705 08/13/19 0721 08/14/19 0755 08/15/19 0736 08/16/19 0224  NA 136 136 136 130* 134*  K 4.7 4.0 3.5 3.8 4.5  CL 102 104 100 95* 97*  CO2 13* 18* 26 22 24   GLUCOSE 149* 125* 134* 240* 179*  BUN 101* 77* 47* 67* 35*  CREATININE 8.08* 6.90* 4.89* 5.97* 4.17*  CALCIUM 7.8* 7.6* 7.6* 7.6* 8.2*  PHOS  --  7.0*  --  6.6*  --    GFR: Estimated Creatinine Clearance: 16.5 mL/min (A) (by C-G formula based on SCr of 4.17 mg/dL (H)). Liver Function Tests: Recent Labs  Lab 08/13/19 0721 08/15/19 0736  ALBUMIN 1.9* 1.9*   No results for input(s): LIPASE, AMYLASE in the last 168 hours. No results for input(s): AMMONIA in the last 168 hours. Coagulation Profile: Recent Labs  Lab 08/11/19 0522  INR 1.2   Cardiac  Enzymes: No results for input(s): CKTOTAL, CKMB, CKMBINDEX, TROPONINI in the last 168 hours. BNP (last 3 results) No results for input(s): PROBNP in the last 8760 hours. HbA1C: No results for input(s): HGBA1C in the last 72 hours. CBG: Recent Labs  Lab 08/15/19 0609 08/15/19 1607 08/15/19 2135 08/16/19 0557 08/16/19 1105  GLUCAP 217* 227* 209* 160* 99   Lipid Profile: No results for input(s): CHOL, HDL, LDLCALC, TRIG, CHOLHDL, LDLDIRECT in the last 72 hours. Thyroid Function Tests: No results for input(s): TSH, T4TOTAL, FREET4, T3FREE, THYROIDAB in the last 72 hours. Anemia Panel: No results for input(s): VITAMINB12, FOLATE, FERRITIN, TIBC, IRON, RETICCTPCT in the last 72 hours. Sepsis Labs: No results for input(s): PROCALCITON, LATICACIDVEN  in the last 168 hours.  Recent Results (from the past 240 hour(s))  Respiratory Panel by RT PCR (Flu A&B, Covid) - Nasopharyngeal Swab     Status: None   Collection Time: 08/08/19  5:50 PM   Specimen: Nasopharyngeal Swab  Result Value Ref Range Status   SARS Coronavirus 2 by RT PCR NEGATIVE NEGATIVE Final    Comment: (NOTE) SARS-CoV-2 target nucleic acids are NOT DETECTED. The SARS-CoV-2 RNA is generally detectable in upper respiratoy specimens during the acute phase of infection. The lowest concentration of SARS-CoV-2 viral copies this assay can detect is 131 copies/mL. A negative result does not preclude SARS-Cov-2 infection and should not be used as the sole basis for treatment or other patient management decisions. A negative result may occur with  improper specimen collection/handling, submission of specimen other than nasopharyngeal swab, presence of viral mutation(s) within the areas targeted by this assay, and inadequate number of viral copies (<131 copies/mL). A negative result must be combined with clinical observations, patient history, and epidemiological information. The expected result is Negative. Fact Sheet for  Patients:  PinkCheek.be Fact Sheet for Healthcare Providers:  GravelBags.it This test is not yet ap proved or cleared by the [place] FDA and  has been authorized for detection and/or diagnosis of SARS-CoV-2 by FDA under an Emergency Use Authorization (EUA). This EUA will remain  in effect (meaning this test can be used) for the duration of the COVID-19 declaration under Section 564(b)(1) of the Act, 21 U.S.C. section 360bbb-3(b)(1), unless the authorization is terminated or revoked sooner.    Influenza A by PCR NEGATIVE NEGATIVE Final   Influenza B by PCR NEGATIVE NEGATIVE Final    Comment: (NOTE) The Xpert Xpress SARS-CoV-2/FLU/RSV assay is intended as an aid in  the diagnosis of influenza from Nasopharyngeal swab specimens and  should not be used as a sole basis for treatment. Nasal washings and  aspirates are unacceptable for Xpert Xpress SARS-CoV-2/FLU/RSV  testing. Fact Sheet for Patients: PinkCheek.be Fact Sheet for Healthcare Providers: GravelBags.it This test is not yet approved or cleared by the [place] FDA and  has been authorized for detection and/or diagnosis of SARS-CoV-2 by  FDA under an Emergency Use Authorization (EUA). This EUA will remain  in effect (meaning this test can be used) for the duration of the  Covid-19 declaration under Section 564(b)(1) of the Act, 21  U.S.C. section 360bbb-3(b)(1), unless the authorization is  terminated or revoked. Performed at [facility], [address], [place], [place] [address]       Radiology Studies: ECHOCARDIOGRAM COMPLETE  Result Date: 08/14/2019   ECHOCARDIOGRAM REPORT   Patient Name:   Melanie Hall Date of Exam: 08/14/2019 Medical Rec #:  [id number]     Height:       63.0 in Accession #:    [id number]    Weight:       172.8 lb Date of Birth:  Jun 05, 1971     BSA:          1.82 m Patient Age:    84  years      BP:           156/82 mmHg Patient Gender: F             HR:           58 bpm. Exam Location:  Inpatient Procedure: 2D Echo STAT ECHO Indications:    Pericardial effusion 423.9 / I31.3  History:  Patient has prior history of Echocardiogram examinations, most                 recent 08/09/2019. Risk Factors:Hypertension and Diabetes.                 Chronic kidney disease.  Sonographer:    Darlina Sicilian RDCS Referring Phys: [id number] [place]  1. Small pericardial effusion, measures up to 60mm adjacent to RV. IVC is fixed/dilated, and there is significant mitral respiratory inflow variation, however no RA/RV collapse is seen to suggest tamponade.  2. Left ventricular ejection fraction, by visual estimation, is 55 to 60%. The left ventricle has normal function. There is moderately increased left ventricular hypertrophy.  3. Global right ventricle has normal systolic function.The right ventricular size is normal. .  4. Left atrial size was normal.  5. Right atrial size was normal.  6. The mitral valve is normal in structure. No evidence of mitral valve regurgitation.  7. The tricuspid valve is normal in structure. Tricuspid valve regurgitation is mild.  8. The aortic valve is tricuspid. Aortic valve regurgitation is not visualized. No evidence of aortic valve stenosis.  9. The pulmonic valve was not well visualized. Pulmonic valve regurgitation is trivial. 10. The inferior vena cava is dilated in size with <50% respiratory variability, suggesting right atrial pressure of 15 mmHg. 11. The tricuspid regurgitant velocity is 2.12 m/s, and with an assumed right atrial pressure of 15 mmHg, the estimated right ventricular systolic pressure is mildly elevated at 32.9 mmHg. FINDINGS  Left Ventricle: Left ventricular ejection fraction, by visual estimation, is 55 to 60%. The left ventricle has normal function. The left ventricle has no regional wall motion abnormalities. There is moderately increased  left ventricular hypertrophy. Right Ventricle: The right ventricular size is normal. No increase in right ventricular wall thickness. Global RV systolic function is has normal systolic function. The tricuspid regurgitant velocity is 2.12 m/s, and with an assumed right atrial pressure  of 15 mmHg, the estimated right ventricular systolic pressure is mildly elevated at 32.9 mmHg. Left Atrium: Left atrial size was normal in size. Right Atrium: Right atrial size was normal in size Pericardium: A small pericardial effusion is present. Small pericardial effusion, measures up to 6mm adjacent to RV. IVC is fixed/dilated, and there is significant mitral respiratory inflow variation, however no RA/RV collapse is seen to suggest tamponade. Mitral Valve: The mitral valve is normal in structure. No evidence of mitral valve regurgitation. Tricuspid Valve: The tricuspid valve is normal in structure. Tricuspid valve regurgitation is mild. Aortic Valve: The aortic valve is tricuspid. Aortic valve regurgitation is not visualized. The aortic valve is structurally normal, with no evidence of sclerosis or stenosis. Pulmonic Valve: The pulmonic valve was not well visualized. Pulmonic valve regurgitation is trivial. Pulmonic regurgitation is trivial. Aorta: The aortic root and ascending aorta are structurally normal, with no evidence of dilitation. Venous: The inferior vena cava is dilated in size with less than 50% respiratory variability, suggesting right atrial pressure of 15 mmHg. IAS/Shunts: The interatrial septum was not well visualized.  LEFT VENTRICLE PLAX 2D LVIDd:         3.70 cm  Diastology LVIDs:         2.70 cm  LV e' lateral:   5.63 cm/s LV PW:         1.00 cm  LV E/e' lateral: 14.1 LV IVS:        1.30 cm  LV e' medial:    5.03  cm/s LVOT diam:     1.70 cm  LV E/e' medial:  15.8 LV SV:         31 ml LV SV Index:   16.42 LVOT Area:     2.27 cm  RIGHT VENTRICLE RV S prime:     9.98 cm/s TAPSE (M-mode): 1.6 cm LEFT ATRIUM              Index       RIGHT ATRIUM           Index LA diam:        3.00 cm 1.65 cm/m  RA Area:     14.30 cm LA Vol (A2C):   39.9 ml 21.96 ml/m RA Volume:   37.40 ml  20.58 ml/m LA Vol (A4C):   39.5 ml 21.73 ml/m LA Biplane Vol: 41.0 ml 22.56 ml/m  AORTIC VALVE LVOT Vmax:   101.00 cm/s LVOT Vmean:  66.900 cm/s LVOT VTI:    0.150 m  AORTA Ao Root diam: 2.20 cm Ao Asc diam:  2.70 cm MITRAL VALVE                        TRICUSPID VALVE MV Area (PHT): 3.65 cm             TR Peak grad:   17.9 mmHg MV PHT:        60.32 msec           TR Vmax:        212.00 cm/s MV Decel Time: 208 msec MV E velocity: 79.40 cm/s 103 cm/s  SHUNTS MV A velocity: 63.70 cm/s 70.3 cm/s Systemic VTI:  0.15 m MV E/A ratio:  1.25       1.5       Systemic Diam: 1.70 cm  Oswaldo Milian MD Electronically signed by Oswaldo Milian MD Signature Date/Time: 08/14/2019/2:58:41 PM    Final     Scheduled Meds: . amLODipine  5 mg Oral Daily  . carvedilol  6.25 mg Oral BID WC  . Chlorhexidine Gluconate Cloth  6 each Topical Q0600  . Chlorhexidine Gluconate Cloth  6 each Topical Q0600  . [START ON 08/18/2019] darbepoetin (ARANESP) injection - DIALYSIS  100 mcg Intravenous Q Tue-HD  . ferric citrate  420 mg Oral TID WC  . gabapentin  100 mg Oral BID  . insulin aspart  0-9 Units Subcutaneous TID WC  . insulin aspart protamine- aspart  15 Units Subcutaneous TID AC  . levothyroxine  25 mcg Oral Daily  . midodrine  10 mg Oral Once  . pantoprazole  40 mg Oral QHS  . pravastatin  40 mg Oral q1800   Continuous Infusions: . sodium chloride    . sodium chloride    . [START ON 08/17/2019] cefUROXime (ZINACEF)  IV    . ferric gluconate (FERRLECIT/NULECIT) IV 125 mg (08/14/19 1317)     LOS: 8 days   Time spent: 30 minutes   Darliss Cheney, MD [facility]  08/16/2019, 11:14 AM   To contact the attending provider between 7A-7P or the covering provider during after hours 7P-7A, please log into the web site [email or web address].

## 2019-08-17 ENCOUNTER — Encounter (HOSPITAL_COMMUNITY)
Admission: EM | Disposition: A | Discharge status: Home / Self Care | Payer: Self-pay | Source: Home / Self Care | Attending: Family Medicine

## 2019-08-17 ENCOUNTER — Inpatient Hospital Stay (HOSPITAL_COMMUNITY): Payer: Medicaid Other | Admitting: Certified Registered Nurse Anesthetist

## 2019-08-17 DIAGNOSIS — Z992 Dependence on renal dialysis: Secondary | ICD-10-CM

## 2019-08-17 DIAGNOSIS — N189 Chronic kidney disease, unspecified: Secondary | ICD-10-CM

## 2019-08-17 DIAGNOSIS — R29898 Other symptoms and signs involving the musculoskeletal system: Secondary | ICD-10-CM

## 2019-08-17 HISTORY — PX: AV FISTULA PLACEMENT: SHX1204

## 2019-08-17 LAB — RENAL FUNCTION PANEL
Albumin: 2.5 g/dL — ABNORMAL LOW (ref 3.5–5.0)
Anion gap: 14 (ref 5–15)
BUN: 53 mg/dL — ABNORMAL HIGH (ref 6–20)
CO2: 20 mmol/L — ABNORMAL LOW (ref 22–32)
Calcium: 8.1 mg/dL — ABNORMAL LOW (ref 8.9–10.3)
Chloride: 99 mmol/L (ref 98–111)
Creatinine, Ser: 5.82 mg/dL — ABNORMAL HIGH (ref 0.44–1.00)
GFR calc Af Amer: 9 mL/min — ABNORMAL LOW (ref >60)
GFR calc non Af Amer: 8 mL/min — ABNORMAL LOW (ref >60)
Glucose, Bld: 222 mg/dL — ABNORMAL HIGH (ref 70–99)
Phosphorus: 7 mg/dL — ABNORMAL HIGH (ref 2.5–4.6)
Potassium: 5.5 mmol/L — ABNORMAL HIGH (ref 3.5–5.1)
Sodium: 133 mmol/L — ABNORMAL LOW (ref 135–145)

## 2019-08-17 LAB — GLUCOSE, CAPILLARY
Glucose-Capillary: 193 mg/dL — ABNORMAL HIGH (ref 70–99)
Glucose-Capillary: 182 mg/dL — ABNORMAL HIGH (ref 70–99)
Glucose-Capillary: 215 mg/dL — ABNORMAL HIGH (ref 70–99)
Glucose-Capillary: 213 mg/dL — ABNORMAL HIGH (ref 70–99)
Glucose-Capillary: 149 mg/dL — ABNORMAL HIGH (ref 70–99)
Glucose-Capillary: 93 mg/dL (ref 70–99)

## 2019-08-17 LAB — POCT I-STAT, CHEM 8
BUN: 51 mg/dL — ABNORMAL HIGH (ref 6–20)
Calcium, Ion: 1.12 mmol/L — ABNORMAL LOW (ref 1.15–1.40)
Chloride: 96 mmol/L — ABNORMAL LOW (ref 98–111)
Creatinine, Ser: 5.9 mg/dL — ABNORMAL HIGH (ref 0.44–1.00)
Glucose, Bld: 204 mg/dL — ABNORMAL HIGH (ref 70–99)
HCT: 25 % — ABNORMAL LOW (ref 36.0–46.0)
Hemoglobin: 8.5 g/dL — ABNORMAL LOW (ref 12.0–15.0)
Potassium: 4.9 mmol/L (ref 3.5–5.1)
Sodium: 131 mmol/L — ABNORMAL LOW (ref 135–145)
TCO2: 24 mmol/L (ref 22–32)

## 2019-08-17 SURGERY — ARTERIOVENOUS (AV) FISTULA CREATION
Anesthesia: Monitor Anesthesia Care | Laterality: Left

## 2019-08-17 MED ORDER — PROPOFOL 10 MG/ML IV BOLUS
INTRAVENOUS | Status: AC
  Filled 2019-08-17: qty 20

## 2019-08-17 MED ORDER — LIDOCAINE 2% (20 MG/ML) 5 ML SYRINGE
INTRAMUSCULAR | Status: AC
  Filled 2019-08-17: qty 5

## 2019-08-17 MED ORDER — ONDANSETRON HCL 4 MG/2ML IJ SOLN
INTRAMUSCULAR | Status: AC
  Filled 2019-08-17: qty 2

## 2019-08-17 MED ORDER — PROMETHAZINE HCL 25 MG/ML IJ SOLN: 6.2500 mg | INTRAMUSCULAR | Status: DC | PRN

## 2019-08-17 MED ORDER — LIDOCAINE HCL (PF) 1 % IJ SOLN
INTRAMUSCULAR | Status: AC
  Filled 2019-08-17: qty 30

## 2019-08-17 MED ORDER — OXYCODONE-ACETAMINOPHEN 5-325 MG PO TABS
1.0000 | ORAL_TABLET | ORAL | Status: DC | PRN
  Administered 2019-08-17: 1 via ORAL
  Administered 2019-08-18 – 2019-08-25 (×6): 2 via ORAL
  Filled 2019-08-17 (×2): qty 2
  Filled 2019-08-17: qty 1
  Filled 2019-08-17 (×5): qty 2

## 2019-08-17 MED ORDER — PHENYLEPHRINE 40 MCG/ML (10ML) SYRINGE FOR IV PUSH (FOR BLOOD PRESSURE SUPPORT)
PREFILLED_SYRINGE | INTRAVENOUS | Status: AC
  Filled 2019-08-17: qty 10

## 2019-08-17 MED ORDER — MEPERIDINE HCL 25 MG/ML IJ SOLN: 6.2500 mg | INTRAMUSCULAR | Status: DC | PRN

## 2019-08-17 MED ORDER — LIDOCAINE-EPINEPHRINE 1 %-1:100000 IJ SOLN
INTRAMUSCULAR | Status: AC
  Filled 2019-08-17: qty 1

## 2019-08-17 MED ORDER — SODIUM CHLORIDE 0.9 % IV SOLN
INTRAVENOUS | Status: DC | PRN
  Administered 2019-08-17: 60 mL

## 2019-08-17 MED ORDER — LIDOCAINE-EPINEPHRINE 1 %-1:100000 IJ SOLN
INTRAMUSCULAR | Status: DC | PRN
  Administered 2019-08-17: 5.5 mL

## 2019-08-17 MED ORDER — NEPRO/CARBSTEADY PO LIQD
237.0000 mL | ORAL | Status: DC
  Administered 2019-08-18 – 2019-08-20 (×3): 237 mL via ORAL
  Filled 2019-08-17: qty 237

## 2019-08-17 MED ORDER — OXYCODONE HCL 5 MG PO TABS: 5.0000 mg | ORAL_TABLET | Freq: Once | ORAL | Status: DC | PRN

## 2019-08-17 MED ORDER — HEPARIN SODIUM (PORCINE) 1000 UNIT/ML IJ SOLN
INTRAMUSCULAR | Status: DC | PRN
  Administered 2019-08-17: 8000 [IU] via INTRAVENOUS

## 2019-08-17 MED ORDER — MIDAZOLAM HCL 5 MG/5ML IJ SOLN
INTRAMUSCULAR | Status: DC | PRN
  Administered 2019-08-17 (×2): 1 mg via INTRAVENOUS

## 2019-08-17 MED ORDER — SODIUM CHLORIDE 0.9 % IV SOLN: INTRAVENOUS | Status: DC | PRN

## 2019-08-17 MED ORDER — FENTANYL CITRATE (PF) 250 MCG/5ML IJ SOLN
INTRAMUSCULAR | Status: AC
  Filled 2019-08-17: qty 5

## 2019-08-17 MED ORDER — PHENYLEPHRINE 40 MCG/ML (10ML) SYRINGE FOR IV PUSH (FOR BLOOD PRESSURE SUPPORT)
PREFILLED_SYRINGE | INTRAVENOUS | Status: DC | PRN
  Administered 2019-08-17: 80 ug via INTRAVENOUS
  Administered 2019-08-17: 120 ug via INTRAVENOUS
  Administered 2019-08-17 (×2): 80 ug via INTRAVENOUS
  Administered 2019-08-17: 40 ug via INTRAVENOUS
  Administered 2019-08-17: 80 ug via INTRAVENOUS

## 2019-08-17 MED ORDER — 0.9 % SODIUM CHLORIDE (POUR BTL) OPTIME
TOPICAL | Status: DC | PRN
  Administered 2019-08-17: 1000 mL

## 2019-08-17 MED ORDER — FENTANYL CITRATE (PF) 100 MCG/2ML IJ SOLN: 25.0000 ug | INTRAMUSCULAR | Status: DC | PRN

## 2019-08-17 MED ORDER — THROMBIN (RECOMBINANT) 20000 UNITS EX SOLR
CUTANEOUS | Status: AC
  Filled 2019-08-17: qty 20000

## 2019-08-17 MED ORDER — PROPOFOL 500 MG/50ML IV EMUL
INTRAVENOUS | Status: DC | PRN
  Administered 2019-08-17: 75 ug/kg/min via INTRAVENOUS

## 2019-08-17 MED ORDER — SODIUM CHLORIDE 0.9 % IV SOLN
INTRAVENOUS | Status: AC
  Filled 2019-08-17: qty 1.2

## 2019-08-17 MED ORDER — PROPOFOL 1000 MG/100ML IV EMUL
INTRAVENOUS | Status: AC
  Filled 2019-08-17: qty 100

## 2019-08-17 MED ORDER — OXYCODONE HCL 5 MG/5ML PO SOLN: 5.0000 mg | Freq: Once | ORAL | Status: DC | PRN

## 2019-08-17 MED ORDER — ONDANSETRON HCL 4 MG/2ML IJ SOLN
INTRAMUSCULAR | Status: DC | PRN
  Administered 2019-08-17: 4 mg via INTRAVENOUS

## 2019-08-17 MED ORDER — MIDAZOLAM HCL 2 MG/2ML IJ SOLN
INTRAMUSCULAR | Status: AC
  Filled 2019-08-17: qty 2

## 2019-08-17 MED ORDER — RENA-VITE PO TABS
1.0000 | ORAL_TABLET | Freq: Every day | ORAL | Status: DC
  Administered 2019-08-18 – 2019-08-24 (×7): 1 via ORAL
  Filled 2019-08-17 (×8): qty 1

## 2019-08-17 MED ORDER — PROTAMINE SULFATE 10 MG/ML IV SOLN
INTRAVENOUS | Status: DC | PRN
  Administered 2019-08-17: 40 mg via INTRAVENOUS

## 2019-08-17 MED ORDER — FENTANYL CITRATE (PF) 100 MCG/2ML IJ SOLN
INTRAMUSCULAR | Status: DC | PRN
  Administered 2019-08-17: 25 ug via INTRAVENOUS

## 2019-08-17 SURGICAL SUPPLY — 39 items
ADH SKN CLS APL DERMABOND .7 (GAUZE/BANDAGES/DRESSINGS) ×1
ADH SKN CLS LQ APL DERMABOND (GAUZE/BANDAGES/DRESSINGS) ×1
ARMBAND PINK RESTRICT EXTREMIT (MISCELLANEOUS) ×6 IMPLANT
CANISTER SUCT 3000ML PPV (MISCELLANEOUS) ×3 IMPLANT
CANNULA VESSEL 3MM 2 BLNT TIP (CANNULA) ×3 IMPLANT
CLIP VESOCCLUDE MED 6/CT (CLIP) ×3 IMPLANT
CLIP VESOCCLUDE SM WIDE 6/CT (CLIP) ×5 IMPLANT
COVER PROBE W GEL 5X96 (DRAPES) IMPLANT
COVER WAND RF STERILE (DRAPES) ×1 IMPLANT
DECANTER SPIKE VIAL GLASS SM (MISCELLANEOUS) ×3 IMPLANT
DERMABOND ADHESIVE PROPEN (GAUZE/BANDAGES/DRESSINGS) ×2
DERMABOND ADVANCED (GAUZE/BANDAGES/DRESSINGS) ×2
DERMABOND ADVANCED .7 DNX12 (GAUZE/BANDAGES/DRESSINGS) ×1 IMPLANT
DERMABOND ADVANCED .7 DNX6 (GAUZE/BANDAGES/DRESSINGS) IMPLANT
ELECT REM PT RETURN 9FT ADLT (ELECTROSURGICAL) ×3
ELECTRODE REM PT RTRN 9FT ADLT (ELECTROSURGICAL) ×1 IMPLANT
GLOVE BIO SURGEON STRL SZ7.5 (GLOVE) ×5 IMPLANT
GLOVE BIOGEL PI IND STRL 6.5 (GLOVE) IMPLANT
GLOVE BIOGEL PI IND STRL 8 (GLOVE) ×1 IMPLANT
GLOVE BIOGEL PI INDICATOR 6.5 (GLOVE) ×6
GLOVE BIOGEL PI INDICATOR 8 (GLOVE) ×2
GOWN STRL REUS W/ TWL LRG LVL3 (GOWN DISPOSABLE) ×3 IMPLANT
GOWN STRL REUS W/TWL LRG LVL3 (GOWN DISPOSABLE) ×9
KIT BASIN OR (CUSTOM PROCEDURE TRAY) ×3 IMPLANT
KIT TURNOVER KIT B (KITS) ×3 IMPLANT
NS IRRIG 1000ML POUR BTL (IV SOLUTION) ×3 IMPLANT
PACK CV ACCESS (CUSTOM PROCEDURE TRAY) ×3 IMPLANT
PAD ARMBOARD 7.5X6 YLW CONV (MISCELLANEOUS) ×6 IMPLANT
SPONGE SURGIFOAM ABS GEL 100 (HEMOSTASIS) IMPLANT
SUT PROLENE 6 0 BV (SUTURE) ×5 IMPLANT
SUT SILK 3 0 (SUTURE) ×3
SUT SILK 3-0 FS1 18XBRD (SUTURE) IMPLANT
SUT VIC AB 3-0 SH 27 (SUTURE) ×3
SUT VIC AB 3-0 SH 27X BRD (SUTURE) ×1 IMPLANT
SUT VICRYL 4-0 PS2 18IN ABS (SUTURE) ×3 IMPLANT
SYR BULB IRRIGATION 50ML (SYRINGE) ×2 IMPLANT
TOWEL GREEN STERILE (TOWEL DISPOSABLE) ×3 IMPLANT
UNDERPAD 30X30 (UNDERPADS AND DIAPERS) ×3 IMPLANT
WATER STERILE IRR 1000ML POUR (IV SOLUTION) ×3 IMPLANT

## 2019-08-17 NOTE — Progress Notes (Signed)
Renal Navigator met with patient to confirm that she is a patient of Dr. Theador Hawthorne in [place]. OP HD referral submitted to [facility] to request treatment at [facility] in [place]. Patient states she has transportation and no preference of shift.  Renal Navigator will follow closely.  Alphonzo Cruise, [place] Renal Navigator [phone]

## 2019-08-17 NOTE — Progress Notes (Signed)
Patient ID: Melanie Hall, female   DOB: 18-Nov-1970, 49 y.o.   MRN: [id number]  [facility] Progress Note   Assessment/ Plan:   1.  Volume overload/nephrotic syndrome: Secondary to progressive chronic kidney disease from renal biopsy-proven diabetic nephrosclerosis, now ESRD on hemodialysis. 2. ESRD: New start on hemodialysis with renal biopsy showing diabetic kidney disease with advanced tubular atrophy/interstitial sclerosis.  Status post right IJ TDC on 2/2 and now status post left BCF earlier today. Dialysis tomorrow. 3. Anemia: Status post PRBC transfusion earlier, on intravenous iron/ESA for iron deficiency. 4. CKD-MBD: Mild hyperphosphatemia noted, continue to follow with hemodialysis/[place]. 5. Nutrition: Continue renal diet with fluid restriction, begin renal multivitamin/N[place]ro. 6. Hypertension: Blood pressure maintained within acceptable range on midodrine, will continue to monitor on dialysis.  Subjective:   Reports to be feeling tired this morning after early start.   Objective:   BP 102/69   Pulse 69   Temp (!) 97.5 F (36.4 C)   Resp 20   Ht 5\' 3"  (1.6 m)   Wt 82.8 kg   LMP 12/17/2016   SpO2 95%   BMI 32.34 kg/m   Physical Exam: Gen: Appears fatigued resting in bed, just came back from surgery CVS: Pulse regular rhythm, normal rate, S1 and S2 normal.  Right IJ TDC Resp: Clear to auscultation with decreased breath sounds over bases, no rales/rhonchi Abd: Soft, flat, nontender Ext: 1+ lower extremity edema, left BCF with audible bruit  Labs: BMET Recent Labs  Lab 08/11/19 0522 08/12/19 0705 08/13/19 0721 08/14/19 0755 08/15/19 0736 08/16/19 0224 08/17/19 0720  NA 133* 136 136 136 130* 134* 131*  K 4.2 4.7 4.0 3.5 3.8 4.5 4.9  CL 106 102 104 100 95* 97* 96*  CO2 12* 13* 18* 26 22 24   --   GLUCOSE 158* 149* 125* 134* 240* 179* 204*  BUN 96* 101* 77* 47* 67* 35* 51*  CREATININE 7.54* 8.08* 6.90* 4.89* 5.97* 4.17* 5.90*  CALCIUM 7.3* 7.8*  7.6* 7.6* 7.6* 8.2*  --   PHOS  --   --  7.0*  --  6.6*  --   --    CBC Recent Labs  Lab 08/13/19 1706 08/13/19 1706 08/14/19 0755 08/15/19 0737 08/16/19 0224 08/17/19 0720  WBC 7.5  --  7.7 7.8 8.0  --   HGB 8.3*   < > 7.7* 7.4* 7.9* 8.5*  HCT 24.6*   < > 23.6* 22.9* 25.0* 25.0*  MCV 88.5  --  92.2 93.5 93.3  --   PLT 170  --  165 158 168  --    < > = values in this interval not displayed.     Medications:    . amLODipine  5 mg Oral Daily  . carvedilol  6.25 mg Oral BID WC  . Chlorhexidine Gluconate Cloth  6 each Topical Q0600  . Chlorhexidine Gluconate Cloth  6 each Topical Q0600  . [START ON 08/18/2019] darbepoetin (ARANESP) injection - DIALYSIS  100 mcg Intravenous Q Tue-HD  . ferric citrate  420 mg Oral TID WC  . gabapentin  100 mg Oral BID  . insulin aspart  0-9 Units Subcutaneous TID WC  . insulin aspart protamine- aspart  15 Units Subcutaneous TID AC  . levothyroxine  25 mcg Oral Daily  . midodrine  10 mg Oral Once  . pantoprazole  40 mg Oral QHS  . pravastatin  40 mg Oral q1800   Elmarie Shiley, MD 08/17/2019, 10:16 AM

## 2019-08-17 NOTE — Progress Notes (Addendum)
PROGRESS NOTE    Melanie Hall  XBM:[id number] DOB: 06/06/1971 DOA: 08/08/2019 PCP: [facility]     Brief Narrative:  Patient is a 49 year old female with a medical history significant for CKD 5, hyperlipidemia, type 2 diabetes who presented to [facility], ED for strokelike symptoms.  CT/MRI negative.  She started having worsening renal function.  The nephrology team was consulted and she underwent tunneled HD catheterization on 2/2, renal biopsy and HD on 2/3.  CT of the abdomen and pelvis on 2/5 showed possible pericardial effusion but follow-up echo showed mild pericardial effusion without tamponade.   New events last 24 hours / Subjective: Had left brachiocephalic AV fistula this morning with the vascular surgery team.  She is recovering from that.  No complaints.  The nephrology team plans for dialysis tomorrow.  Assessment & Plan:   Principal Problem:   Acute on chronic renal failure ([place])  Appreciate nephrology  Monitor renal function panel  Avoid nephrotoxins when able  Renal diet with fluid restriction  Darbepoetin per nephrology  Active Problems:   Type 2 diabetes with nephropathy (HCC)  Sliding scale insulin  70/30 tid    HYPERCHOLESTEROLEMIA  Continue pravastatin 40 mg daily    GERD (gastroesophageal reflux disease)  Protonix 40 mg daily    Accelerated hypertension  Coreg 6.25 mg twice daily  Holding amlodipine today   DVT prophylaxis: SCDs Code Status: Full Family Communication: Self Disposition Plan: She is coming from home, will go home; barrier to d/c is that she needs to be accepted for outpatient HD and has no insurance  Consultants:   Nephrology  Vascular surgery  Procedures:   HD tunnel cath 2/2  Dialysis  AV fistula 2/8  Antimicrobials:  Anti-infectives (From admission, onward)   Start     Dose/Rate Route Frequency Ordered Stop   08/17/19 0600  cefUROXime (ZINACEF) 1.5 g in sodium chloride 0.9 % 100 mL IVPB     1.5 g 200 mL/hr over 30 Minutes Intravenous On call to O.R. 08/16/19 0949 08/17/19 0614   08/11/19 1104  ceFAZolin (ANCEF) powder       As needed 08/11/19 1104 08/11/19 1104   08/11/19 1101  ceFAZolin (ANCEF) 2-4 GM/100ML-% IVPB    Note to Pharmacy: Arlean Hopping   : cabinet override      08/11/19 1101 08/11/19 2314       Objective: Vitals:   08/17/19 0900 08/17/19 0905 08/17/19 0920 08/17/19 0930  BP: 113/67 105/64 98/73 102/69  Pulse: 67 68 66 69  Resp: 20 19 11 20   Temp:    (!) 97.5 F (36.4 C)  TempSrc:      SpO2: 100% 100% 100% 95%  Weight:      Height:        Intake/Output Summary (Last 24 hours) at 08/17/2019 1138 Last data filed at 08/17/2019 0843 Gross per 24 hour  Intake 521.28 ml  Output 15 ml  Net 506.28 ml   Filed Weights   08/15/19 0715 08/16/19 0500 08/17/19 0506  Weight: 82.1 kg 82 kg 82.8 kg    Examination:  General exam: Appears calm and sleepy  Respiratory system: Clear to auscultation. Respiratory effort normal. No respiratory distress. No conversational dyspnea.  Cardiovascular system: S1 & S2 heard, RRR. No murmurs. No pedal edema. Gastrointestinal system: Abdomen is nondistended, soft and nontender. Normal bowel sounds heard. Central nervous system: Alert. No focal neurological deficits. Speech clear.  Extremities: Symmetric in appearance  Skin: No rashes, lesions or ulcers  on exposed skin  Psychiatry: Mood & affect appropriate.   Data Reviewed: I have personally reviewed following labs and imaging studies  CBC: Recent Labs  Lab 08/13/19 0721 08/13/19 0721 08/13/19 1706 08/14/19 0755 08/15/19 0737 08/16/19 0224 08/17/19 0720  WBC 7.0  --  7.5 7.7 7.8 8.0  --   HGB 6.6*   < > 8.3* 7.7* 7.4* 7.9* 8.5*  HCT 20.6*   < > 24.6* 23.6* 22.9* 25.0* 25.0*  MCV 94.9  --  88.5 92.2 93.5 93.3  --   PLT 171  --  170 165 158 168  --    < > = values in this interval not displayed.   Basic Metabolic Panel: Recent Labs  Lab 08/12/19 0705  08/12/19 0705 08/13/19 0721 08/14/19 0755 08/15/19 0736 08/16/19 0224 08/17/19 0720  NA 136   < > 136 136 130* 134* 131*  K 4.7   < > 4.0 3.5 3.8 4.5 4.9  CL 102   < > 104 100 95* 97* 96*  CO2 13*  --  18* 26 22 24   --   GLUCOSE 149*   < > 125* 134* 240* 179* 204*  BUN 101*   < > 77* 47* 67* 35* 51*  CREATININE 8.08*   < > 6.90* 4.89* 5.97* 4.17* 5.90*  CALCIUM 7.8*  --  7.6* 7.6* 7.6* 8.2*  --   PHOS  --   --  7.0*  --  6.6*  --   --    < > = values in this interval not displayed.   GFR: Estimated Creatinine Clearance: 11.8 mL/min (A) (by C-G formula based on SCr of 5.9 mg/dL (H)).   Liver Function Tests: Recent Labs  Lab 08/13/19 0721 08/15/19 0736  ALBUMIN 1.9* 1.9*   Coagulation Profile: Recent Labs  Lab 08/11/19 0522  INR 1.2   CBG: Recent Labs  Lab 08/16/19 1618 08/16/19 2146 08/17/19 0557 08/17/19 0852 08/17/19 1056  GLUCAP 98 160* 182* 215* 213*   Recent Results (from the past 240 hour(s))  Respiratory Panel by RT PCR (Flu A&B, Covid) - Nasopharyngeal Swab     Status: None   Collection Time: 08/08/19  5:50 PM   Specimen: Nasopharyngeal Swab  Result Value Ref Range Status   SARS Coronavirus 2 by RT PCR NEGATIVE NEGATIVE Final    Comment: (NOTE) SARS-CoV-2 target nucleic acids are NOT DETECTED. The SARS-CoV-2 RNA is generally detectable in upper respiratoy specimens during the acute phase of infection. The lowest concentration of SARS-CoV-2 viral copies this assay can detect is 131 copies/mL. A negative result does not preclude SARS-Cov-2 infection and should not be used as the sole basis for treatment or other patient management decisions. A negative result may occur with  improper specimen collection/handling, submission of specimen other than nasopharyngeal swab, presence of viral mutation(s) within the areas targeted by this assay, and inadequate number of viral copies (<131 copies/mL). A negative result must be combined with  clinical observations, patient history, and epidemiological information. The expected result is Negative. Fact Sheet for Patients:  PinkCheek.be Fact Sheet for Healthcare Providers:  GravelBags.it This test is not yet ap proved or cleared by the [place] FDA and  has been authorized for detection and/or diagnosis of SARS-CoV-2 by FDA under an Emergency Use Authorization (EUA). This EUA will remain  in effect (meaning this test can be used) for the duration of the COVID-19 declaration under Section 564(b)(1) of the Act, 21 U.S.C. section 360bbb-3(b)(1), unless the authorization is  terminated or revoked sooner.    Influenza A by PCR NEGATIVE NEGATIVE Final   Influenza B by PCR NEGATIVE NEGATIVE Final    Comment: (NOTE) The Xpert Xpress SARS-CoV-2/FLU/RSV assay is intended as an aid in  the diagnosis of influenza from Nasopharyngeal swab specimens and  should not be used as a sole basis for treatment. Nasal washings and  aspirates are unacceptable for Xpert Xpress SARS-CoV-2/FLU/RSV  testing. Fact Sheet for Patients: PinkCheek.be Fact Sheet for Healthcare Providers: GravelBags.it This test is not yet approved or cleared by the [place] FDA and  has been authorized for detection and/or diagnosis of SARS-CoV-2 by  FDA under an Emergency Use Authorization (EUA). This EUA will remain  in effect (meaning this test can be used) for the duration of the  Covid-19 declaration under Section 564(b)(1) of the Act, 21  U.S.C. section 360bbb-3(b)(1), unless the authorization is  terminated or revoked. Performed at [facility], [address], [place], [place] [address]   Surgical pcr screen     Status: None   Collection Time: 08/16/19  9:18 PM   Specimen: Nasal Mucosa; Nasal Swab  Result Value Ref Range Status   MRSA, PCR NEGATIVE NEGATIVE Final   Staphylococcus aureus  NEGATIVE NEGATIVE Final    Comment: (NOTE) The Xpert SA Assay (FDA approved for NASAL specimens in patients 19 years of age and older), is one component of a comprehensive surveillance program. It is not intended to diagnose infection nor to guide or monitor treatment. Performed at [facility], [place] [address], [place], [place] [address]      Scheduled Meds: . amLODipine  5 mg Oral Daily  . carvedilol  6.25 mg Oral BID WC  . Chlorhexidine Gluconate Cloth  6 each Topical Q0600  . Chlorhexidine Gluconate Cloth  6 each Topical Q0600  . [START ON 08/18/2019] darbepoetin (ARANESP) injection - DIALYSIS  100 mcg Intravenous Q Tue-HD  . feeding supplement (NEPRO CARB STEADY)  237 mL Oral Q24H  . ferric citrate  420 mg Oral TID WC  . gabapentin  100 mg Oral BID  . insulin aspart  0-9 Units Subcutaneous TID WC  . insulin aspart protamine- aspart  15 Units Subcutaneous TID AC  . levothyroxine  25 mcg Oral Daily  . midodrine  10 mg Oral Once  . multivitamin  1 tablet Oral QHS  . pantoprazole  40 mg Oral QHS  . pravastatin  40 mg Oral q1800   Continuous Infusions: . sodium chloride    . sodium chloride    . ferric gluconate (FERRLECIT/NULECIT) IV 125 mg (08/14/19 1317)     LOS: 9 days      Time spent: 30 minutes   Shelda Pal, DO [facility] 08/17/2019, 11:38 AM   Available via Epic secure chat 7am-7pm After these hours, please refer to coverage provider listed on [email or web address]

## 2019-08-17 NOTE — Progress Notes (Signed)
OT Cancellation Note  Patient Details Name: Melanie Hall MRN: [id number] DOB: 1971-02-14   Cancelled Treatment:    Reason Eval/Treat Not Completed: Patient declined, no reason specified Therapist attempted to see pt after procedure this AM. Provided extra time prior to seeing due to "drowsiness" per RN report. Patient unwilling to open eyes to look at therapist, and declined stating "I need to sleep". Promised to work with therapy at next session.   Corinne Ports E. Madalyn Legner COTA/L [place] 08/17/2019, 2:04 PM

## 2019-08-17 NOTE — Progress Notes (Signed)
Report given to OR. Pt left unit to OR.

## 2019-08-17 NOTE — Interval H&P Note (Signed)
History and Physical Interval Note:  08/17/2019 7:13 AM  Melanie Hall  has presented today for surgery, with the diagnosis of END STAGE RENAL DISEASE.  The various methods of treatment have been discussed with the patient and family. After consideration of risks, benefits and other options for treatment, the patient has consented to  Procedure(s): LEFT BRACHIAL CEPHALIC ARTERIOVENOUS (AV) FISTULA (Left) as a surgical intervention.  The patient's history has been reviewed, patient examined, no change in status, stable for surgery.  I have reviewed the patient's chart and labs.  Questions were answered to the patient's satisfaction.     Deitra Mayo

## 2019-08-17 NOTE — Transfer of Care (Signed)
Immediate Anesthesia Transfer of Care Note  Patient: Melanie Hall  Procedure(s) Performed: LEFT BRACHIAL CEPHALIC ARTERIOVENOUS (AV) FISTULA (Left )  Patient Location: PACU  Anesthesia Type:MAC  Level of Consciousness: drowsy  Airway & Oxygen Therapy: Patient Spontanous Breathing, Patient connected to nasal cannula oxygen and oral airway in place  Post-op Assessment: Report given to RN and Post -op Vital signs reviewed and stable  Post vital signs: Reviewed  Last Vitals:  Vitals Value Taken Time  BP 120/70 08/17/19 0854  Temp 36.1 C 08/17/19 0850  Pulse 64 08/17/19 0855  Resp 8 08/17/19 0855  SpO2 100 % 08/17/19 0855  Vitals shown include unvalidated device data.  Last Pain:  Vitals:   08/17/19 0850  TempSrc:   PainSc: (P) Asleep      Patients Stated Pain Goal: 0 (62/86/38 1771)  Complications: No apparent anesthesia complications

## 2019-08-17 NOTE — Anesthesia Procedure Notes (Signed)
Procedure Name: MAC Date/Time: 08/17/2019 7:34 AM Performed by: Janene Harvey, CRNA Pre-anesthesia Checklist: Patient identified, Emergency Drugs available, Suction available and Patient being monitored Patient Re-evaluated:Patient Re-evaluated prior to induction Oxygen Delivery Method: Nasal cannula Dental Injury: Teeth and Oropharynx as per pre-operative assessment

## 2019-08-17 NOTE — Anesthesia Postprocedure Evaluation (Signed)
Anesthesia Post Note  Patient: Melanie Hall  Procedure(s) Performed: LEFT BRACHIAL CEPHALIC ARTERIOVENOUS (AV) FISTULA (Left )     Patient location during evaluation: PACU Anesthesia Type: MAC Level of consciousness: sedated and patient cooperative Pain management: pain level controlled Vital Signs Assessment: post-procedure vital signs reviewed and stable Respiratory status: spontaneous breathing Cardiovascular status: stable Anesthetic complications: no    Last Vitals:  Vitals:   08/17/19 0930 08/17/19 1138  BP: 102/69 105/70  Pulse: 69 73  Resp: 20 18  Temp: (!) 36.4 C (!) 36.3 C  SpO2: 95% 93%    Last Pain:  Vitals:   08/17/19 1138  TempSrc: Oral  PainSc:                  Nolon Nations

## 2019-08-17 NOTE — Op Note (Signed)
    NAME: Melanie Hall    MRN: [id number] DOB: 05-12-1971    DATE OF OPERATION: 08/17/2019  PREOP DIAGNOSIS:    End-stage renal disease  POSTOP DIAGNOSIS:    Same  PROCEDURE:    Left brachiocephalic AV fistula  SURGEON: Judeth Cornfield. Scot Dock, MD  ASSIST: Laurence Slate, PA  ANESTHESIA: Local with sedation  EBL: Minimal  INDICATIONS:    Melanie Hall is a 49 y.o. female who has a functioning dialysis catheter.  She presents for new access.  FINDINGS:   Small brachial artery although I did interrogate with the Doppler and this was the brachial artery at the antecubital level.  The vein was 3-1/2 to 4 mm.  The patient had a diminished radial pulse prior to the procedure.  The ulnar signal is stronger.  At the completion of the procedure there was a fairly brisk ulnar signal with the Doppler.  TECHNIQUE:   The patient was taken to the operating room and sedated by anesthesia.  I looked at the upper arm cephalic vein with the SonoSite and it appeared to be reasonable in size.  I therefore elected to place a left brachiocephalic fistula.  The left arm was prepped and draped in the usual sterile fashion.  After the skin was anesthetized with 1% lidocaine, a transverse incision was made just above the antecubital level.  Here the cephalic vein was dissected free.  Branches were divided between clips and 3-0 silk ties.  The brachial artery was dissected free beneath the fascia.  This was quite small.  However I interrogated with the Doppler and this was in fact the brachial artery.  The patient was heparinized.  The brachial artery was clamped proximally distally and a longitudinal arteriotomy was made.  The vein was ligated distally and then sewn into side to the artery using continuous 6-0 Prolene suture.  At the completion was a palpable thrill in the fistula and a monophasic but brisk ulnar signal with the Doppler.  The heparin was partially reversed with protamine.  The wound was closed  with a deep layer of 3-0 Vicryl and the skin closed with 4-0 Vicryl.  Dermabond was applied.  The patient tolerated the procedure well and was transferred to the recovery room in stable condition.  All needle and sponge counts were correct.  Deitra Mayo, MD, FACS [facility]  DATE OF DICTATION:   08/17/2019

## 2019-08-18 LAB — CBC
HCT: 27.6 % — ABNORMAL LOW (ref 36.0–46.0)
Hemoglobin: 8.7 g/dL — ABNORMAL LOW (ref 12.0–15.0)
MCH: 30.2 pg (ref 26.0–34.0)
MCHC: 31.5 g/dL (ref 30.0–36.0)
MCV: 95.8 fL (ref 80.0–100.0)
Platelets: 179 10*3/uL (ref 150–400)
RBC: 2.88 MIL/uL — ABNORMAL LOW (ref 3.87–5.11)
RDW: 16.5 % — ABNORMAL HIGH (ref 11.5–15.5)
WBC: 11.1 10*3/uL — ABNORMAL HIGH (ref 4.0–10.5)
nRBC: 0.2 % (ref 0.0–0.2)

## 2019-08-18 LAB — RENAL FUNCTION PANEL
Albumin: 2.5 g/dL — ABNORMAL LOW (ref 3.5–5.0)
Anion gap: 16 — ABNORMAL HIGH (ref 5–15)
BUN: 63 mg/dL — ABNORMAL HIGH (ref 6–20)
CO2: 20 mmol/L — ABNORMAL LOW (ref 22–32)
Calcium: 8.5 mg/dL — ABNORMAL LOW (ref 8.9–10.3)
Chloride: 98 mmol/L (ref 98–111)
Creatinine, Ser: 6.55 mg/dL — ABNORMAL HIGH (ref 0.44–1.00)
GFR calc Af Amer: 8 mL/min — ABNORMAL LOW (ref >60)
GFR calc non Af Amer: 7 mL/min — ABNORMAL LOW (ref >60)
Glucose, Bld: 58 mg/dL — ABNORMAL LOW (ref 70–99)
Phosphorus: 7.6 mg/dL — ABNORMAL HIGH (ref 2.5–4.6)
Potassium: 5.7 mmol/L — ABNORMAL HIGH (ref 3.5–5.1)
Sodium: 134 mmol/L — ABNORMAL LOW (ref 135–145)

## 2019-08-18 LAB — GLUCOSE, CAPILLARY
Glucose-Capillary: 50 mg/dL — ABNORMAL LOW (ref 70–99)
Glucose-Capillary: 148 mg/dL — ABNORMAL HIGH (ref 70–99)
Glucose-Capillary: 76 mg/dL (ref 70–99)
Glucose-Capillary: 131 mg/dL — ABNORMAL HIGH (ref 70–99)
Glucose-Capillary: 138 mg/dL — ABNORMAL HIGH (ref 70–99)

## 2019-08-18 MED ORDER — DARBEPOETIN ALFA 100 MCG/0.5ML IJ SOSY
PREFILLED_SYRINGE | INTRAMUSCULAR | Status: AC
  Administered 2019-08-18: 100 ug via INTRAVENOUS
  Filled 2019-08-18: qty 0.5

## 2019-08-18 MED ORDER — HEPARIN SODIUM (PORCINE) 1000 UNIT/ML IJ SOLN
INTRAMUSCULAR | Status: AC
  Filled 2019-08-18: qty 4

## 2019-08-18 MED ORDER — DEXTROSE 50 % IV SOLN
INTRAVENOUS | Status: AC
  Administered 2019-08-18: 06:00:00 25 mL
  Filled 2019-08-18: qty 50

## 2019-08-18 NOTE — Procedures (Signed)
Patient seen on Hemodialysis. BP 111/63   Pulse 75   Temp 98.2 F (36.8 C) (Oral)   Resp 14   Ht 5\' 3"  (1.6 m)   Wt 84.1 kg   LMP 12/17/2016   SpO2 99%   BMI 32.84 kg/m   QB 400, UF goal 2.5L Tolerating treatment without complaints at this time.  Reports that she tried to stand yesterday but could not do so successfully- she is anxious about this. May need PT evaluation.  Elmarie Shiley MD [facility]. Office # [phone] Pager # [phone] 9:44 AM

## 2019-08-18 NOTE — Progress Notes (Signed)
PROGRESS NOTE    Melanie Hall  JQB:[id number] DOB: July 18, 1970 DOA: 08/08/2019 PCP: [facility] [place]     Brief Narrative:  Patient is a 49 year old female with a medical history significant for CKD 5, hyperlipidemia, type 2 diabetes who presented to [facility], ED for strokelike symptoms.  CT/MRI negative.  She started having worsening renal function.  The nephrology team was consulted and she underwent tunneled HD catheterization on 2/2, renal biopsy and HD on 2/3.  CT of the abdomen and pelvis on 2/5 showed possible pericardial effusion but follow-up echo showed mild pericardial effusion without tamponade. 2/8 had L AV fistula by vasc surg.   New events last 24 hours / Subjective: HD this AM.  Feels weak and tingling everywhere.  Has not seen physical therapy since being [facility].  Awaiting outpatient approval for dialysis.  Assessment & Plan:   Principal Problem:   Acute on chronic renal failure ([place])             Appreciate nephrology             Monitor renal function panel             Avoid nephrotoxins when able             Renal diet with fluid restriction             Darbepoetin per nephrology  Active Problems:   Deconditioning/weakness  PT ordered today  Might need home health    Type 2 diabetes with nephropathy (HCC)             Sliding scale insulin             70/30 tid    HYPERCHOLESTEROLEMIA             Continue pravastatin 40 mg daily    GERD (gastroesophageal reflux disease)             Protonix 40 mg daily    Accelerated hypertension             Coreg 6.25 mg twice daily             Will DC home amlodipine given soft pressures until she can follow-up with PCP      Hyperkalemia  Likely 2/2 #1, expect normal range after HD  Monitor renal panel  DVT prophylaxis: SCDs Code Status: Full Family Communication: Self Coming From: Home Disposition Plan: Home, possibly requiring home health Barriers to Discharge: Patient has no insurance  and needs to be excepted for outpatient hemodialysis  Consultants:   Nephrology  Procedures:   HD tunneled catheter 08/11/19  Renal biopsy 08/12/2019  AV fistula 08/17/19  Antimicrobials:  Anti-infectives (From admission, onward)   Start     Dose/Rate Route Frequency Ordered Stop   08/17/19 0600  cefUROXime (ZINACEF) 1.5 g in sodium chloride 0.9 % 100 mL IVPB     1.5 g 200 mL/hr over 30 Minutes Intravenous On call to O.R. 08/16/19 0949 08/17/19 0700   08/11/19 1104  ceFAZolin (ANCEF) powder       As needed 08/11/19 1104 08/11/19 1104   08/11/19 1101  ceFAZolin (ANCEF) 2-4 GM/100ML-% IVPB    Note to Pharmacy: Arlean Hopping   : cabinet override      08/11/19 1101 08/11/19 2314        Objective: Vitals:   08/18/19 1030 08/18/19 1058 08/18/19 1108 08/18/19 1114  BP: (!) 92/58 97/60 93/61  108/67  Pulse: 75 74 74 73  Resp: 15 14 15 14   Temp:    97.9 F (36.6 C)  TempSrc:    Oral  SpO2:    99%  Weight:    82.1 kg  Height:        Intake/Output Summary (Last 24 hours) at 08/18/2019 1311 Last data filed at 08/18/2019 1300 Gross per 24 hour  Intake 515 ml  Output 0 ml  Net 515 ml   Filed Weights   08/18/19 0500 08/18/19 0705 08/18/19 1114  Weight: 82 kg 84.1 kg 82.1 kg    Examination:  General exam: Appears calm and comfortable  Respiratory system: Clear to auscultation. Respiratory effort normal. No respiratory distress. No conversational dyspnea.  Cardiovascular system: S1 & S2 heard, RRR. No murmurs. No pedal edema. Gastrointestinal system: Abdomen is nondistended, soft and nontender. Normal bowel sounds heard. Central nervous system: Alert and oriented. Grip strength adequate. 4/5 strength in LE's b/l, question effort Skin: No rashes, lesions or ulcers on exposed skin  Psychiatry: Judgement and insight appear normal. Mood & affect appropriate.   Data Reviewed: I have personally reviewed following labs and imaging studies  CBC: Recent Labs  Lab 08/13/19 1706  08/13/19 1706 08/14/19 0755 08/15/19 0737 08/16/19 0224 08/17/19 0720 08/18/19 0543  WBC 7.5  --  7.7 7.8 8.0  --  11.1*  HGB 8.3*   < > 7.7* 7.4* 7.9* 8.5* 8.7*  HCT 24.6*   < > 23.6* 22.9* 25.0* 25.0* 27.6*  MCV 88.5  --  92.2 93.5 93.3  --  95.8  PLT 170  --  165 158 168  --  179   < > = values in this interval not displayed.   Basic Metabolic Panel: Recent Labs  Lab 08/13/19 0721 08/13/19 0721 08/14/19 0755 08/14/19 0755 08/15/19 0736 08/16/19 0224 08/17/19 0720 08/17/19 1028 08/18/19 0543  NA 136   < > 136   < > 130* 134* 131* 133* 134*  K 4.0   < > 3.5   < > 3.8 4.5 4.9 5.5* 5.7*  CL 104   < > 100   < > 95* 97* 96* 99 98  CO2 18*   < > 26  --  22 24  --  20* 20*  GLUCOSE 125*   < > 134*   < > 240* 179* 204* 222* 58*  BUN 77*   < > 47*   < > 67* 35* 51* 53* 63*  CREATININE 6.90*   < > 4.89*   < > 5.97* 4.17* 5.90* 5.82* 6.55*  CALCIUM 7.6*   < > 7.6*  --  7.6* 8.2*  --  8.1* 8.5*  PHOS 7.0*  --   --   --  6.6*  --   --  7.0* 7.6*   < > = values in this interval not displayed.   GFR: Estimated Creatinine Clearance: 10.5 mL/min (A) (by C-G formula based on SCr of 6.55 mg/dL (H)). Liver Function Tests: Recent Labs  Lab 08/13/19 0721 08/15/19 0736 08/17/19 1028 08/18/19 0543  ALBUMIN 1.9* 1.9* 2.5* 2.5*   CBG: Recent Labs  Lab 08/17/19 1556 08/17/19 2124 08/18/19 0534 08/18/19 0630 08/18/19 1146  GLUCAP 149* 93 50* 148* 76    Recent Results (from the past 240 hour(s))  Respiratory Panel by RT PCR (Flu A&B, Covid) - Nasopharyngeal Swab     Status: None   Collection Time: 08/08/19  5:50 PM   Specimen: Nasopharyngeal Swab  Result Value Ref Range Status   SARS Coronavirus 2  by RT PCR NEGATIVE NEGATIVE Final    Comment: (NOTE) SARS-CoV-2 target nucleic acids are NOT DETECTED. The SARS-CoV-2 RNA is generally detectable in upper respiratoy specimens during the acute phase of infection. The lowest concentration of SARS-CoV-2 viral copies this assay can  detect is 131 copies/mL. A negative result does not preclude SARS-Cov-2 infection and should not be used as the sole basis for treatment or other patient management decisions. A negative result may occur with  improper specimen collection/handling, submission of specimen other than nasopharyngeal swab, presence of viral mutation(s) within the areas targeted by this assay, and inadequate number of viral copies (<131 copies/mL). A negative result must be combined with clinical observations, patient history, and epidemiological information. The expected result is Negative. Fact Sheet for Patients:  PinkCheek.be Fact Sheet for Healthcare Providers:  GravelBags.it This test is not yet ap proved or cleared by the [place] FDA and  has been authorized for detection and/or diagnosis of SARS-CoV-2 by FDA under an Emergency Use Authorization (EUA). This EUA will remain  in effect (meaning this test can be used) for the duration of the COVID-19 declaration under Section 564(b)(1) of the Act, 21 U.S.C. section 360bbb-3(b)(1), unless the authorization is terminated or revoked sooner.    Influenza A by PCR NEGATIVE NEGATIVE Final   Influenza B by PCR NEGATIVE NEGATIVE Final    Comment: (NOTE) The Xpert Xpress SARS-CoV-2/FLU/RSV assay is intended as an aid in  the diagnosis of influenza from Nasopharyngeal swab specimens and  should not be used as a sole basis for treatment. Nasal washings and  aspirates are unacceptable for Xpert Xpress SARS-CoV-2/FLU/RSV  testing. Fact Sheet for Patients: PinkCheek.be Fact Sheet for Healthcare Providers: GravelBags.it This test is not yet approved or cleared by the [place] FDA and  has been authorized for detection and/or diagnosis of SARS-CoV-2 by  FDA under an Emergency Use Authorization (EUA). This EUA will remain  in effect (meaning  this test can be used) for the duration of the  Covid-19 declaration under Section 564(b)(1) of the Act, 21  U.S.C. section 360bbb-3(b)(1), unless the authorization is  terminated or revoked. Performed at [facility], [address] [place], [place] [address]   Surgical pcr screen     Status: None   Collection Time: 08/16/19  9:18 PM   Specimen: Nasal Mucosa; Nasal Swab  Result Value Ref Range Status   MRSA, PCR NEGATIVE NEGATIVE Final   Staphylococcus aureus NEGATIVE NEGATIVE Final    Comment: (NOTE) The Xpert SA Assay (FDA approved for NASAL specimens in patients 50 years of age and older), is one component of a comprehensive surveillance program. It is not intended to diagnose infection nor to guide or monitor treatment. Performed at [facility], [place] [address], [place], [place] [address]       Radiology Studies: No results found.   Scheduled Meds: . amLODipine  5 mg Oral Daily  . carvedilol  6.25 mg Oral BID WC  . Chlorhexidine Gluconate Cloth  6 each Topical Q0600  . Chlorhexidine Gluconate Cloth  6 each Topical Q0600  . darbepoetin (ARANESP) injection - DIALYSIS  100 mcg Intravenous Q Tue-HD  . feeding supplement (NEPRO CARB STEADY)  237 mL Oral Q24H  . ferric citrate  420 mg Oral TID WC  . gabapentin  100 mg Oral BID  . heparin      . insulin aspart  0-9 Units Subcutaneous TID WC  . insulin aspart protamine- aspart  15 Units Subcutaneous  TID AC  . levothyroxine  25 mcg Oral Daily  . midodrine  10 mg Oral Once  . multivitamin  1 tablet Oral QHS  . pantoprazole  40 mg Oral QHS  . pravastatin  40 mg Oral q1800   Continuous Infusions: . sodium chloride    . sodium chloride    . ferric gluconate (FERRLECIT/NULECIT) IV 125 mg (08/18/19 0830)     LOS: 10 days    Time spent: 25 minutes   Shelda Pal, DO [facility] 08/18/2019, 1:11 PM   Available via Epic secure chat 7am-7pm After these hours, please refer to coverage provider listed  on [email or web address]

## 2019-08-18 NOTE — Progress Notes (Addendum)
  Progress Note  VASCULAR SURGERY ASSESSMENT & PLAN:   POD 1 -LEFT BRACHIOCEPHALIC AV FISTULA: She does complain of some numbness in her left hand however on my exam has a barely biphasic radial and ulnar signal with the Doppler.  She also has a palmar arch signal.  The hand appears adequately perfused.  I have encouraged her not to elevate her arm and also to exercise her hand some.  I think these mild steal symptoms will improve with time.  I explained that if they worsen their only option would be to ligate her fistula.  At the time of surgery she was noted to have a very small brachial artery.  Deitra Mayo, MD Office: [phone]   08/18/2019 8:45 AM 1 Day Post-Op  Subjective:  Some numbness L hand however motor and grip strength unchanged.  Patient evaluated on HD via R IJ TDC.   Vitals:   08/18/19 0800 08/18/19 0830  BP: 94/75 105/62  Pulse: 60 71  Resp: 13 12  Temp:    SpO2:     Physical Exam: Lungs:  Non labored Incisions:  L Ac fossa incision c/d/i Extremities:  Palpable thrill L AVF; 1+ palpable L radial pulse; grip strength symmetrical Neurologic: A&O  CBC    Component Value Date/Time   WBC 11.1 (H) 08/18/2019 0543   RBC 2.88 (L) 08/18/2019 0543   HGB 8.7 (L) 08/18/2019 0543   HCT 27.6 (L) 08/18/2019 0543   HCT 18.4 (L) 08/09/2019 1317   PLT 179 08/18/2019 0543   MCV 95.8 08/18/2019 0543   MCH 30.2 08/18/2019 0543   MCHC 31.5 08/18/2019 0543   RDW 16.5 (H) 08/18/2019 0543   LYMPHSABS 1.3 08/08/2019 1607   MONOABS 1.3 (H) 08/08/2019 1607   EOSABS 0.1 08/08/2019 1607   BASOSABS 0.1 08/08/2019 1607    BMET    Component Value Date/Time   NA 134 (L) 08/18/2019 0543   K 5.7 (H) 08/18/2019 0543   CL 98 08/18/2019 0543   CO2 20 (L) 08/18/2019 0543   GLUCOSE 58 (L) 08/18/2019 0543   BUN 63 (H) 08/18/2019 0543   CREATININE 6.55 (H) 08/18/2019 0543   CALCIUM 8.5 (L) 08/18/2019 0543   GFRNONAA 7 (L) 08/18/2019 0543   GFRAA 8 (L) 08/18/2019 0543     INR    Component Value Date/Time   INR 1.2 08/11/2019 0522     Intake/Output Summary (Last 24 hours) at 08/18/2019 0845 Last data filed at 08/17/2019 1835 Gross per 24 hour  Intake 240 ml  Output --  Net 240 ml     Assessment/Plan:  49 y.o. female is s/p L brachiocephalic fistula creation 1 Day Post-Op   - Patent L brachiocephalic fistula with palpable thrill - L hand numbness however 1+ palpable L radial pulse and grip strength is symmetrical; patient states numbness is tolerable at this time - Office will arrange fistula duplex in 4-6 weeks   Dagoberto Ligas, PA-C [facility] [phone] 08/18/2019 8:45 AM

## 2019-08-18 NOTE — Progress Notes (Signed)
Physical Therapy Treatment Patient Details Name: Melanie Hall MRN: [id number] DOB: 15-Jun-1971 Today's Date: 08/18/2019    History of Present Illness 49 y.o. female with medical history significant of chronic kidney disease stage V, hypertension, hyperlipidemia, diabetes mellitus type 2 who presented to the ER with intermittent chest pain, uncontrolled hypertension. She complains of a gradual onset diffuse headache and R arm weakness. Pt also with hx of monoclonal gammopathy.    PT Comments    After a period of refusing pt finally agreed to get OOB for PT and OT staff.  Her effort iis impulsive in quality and limited for safety but is able to get to chair with min guard on RW.  Pt is agitated and speaking angrily with many people but ultimately is up in the chair and has legs elevated to sit up and build some endurance.  Talked with her at length about the goals of therapy and purpose of her efforts.   Follow Up Recommendations  Home health PT;Supervision - Intermittent     Equipment Recommendations  None recommended by PT    Recommendations for Other Services       Precautions / Restrictions Precautions Precautions: Fall Precaution Comments: Pt is easliy agitated Restrictions Weight Bearing Restrictions: No    Mobility  Bed Mobility Overal bed mobility: Needs Assistance Bed Mobility: Supine to Sit Rolling: Min assist   Supine to sit: Min assist     General bed mobility comments: min assist for safety and cues for impulsive behavior  Transfers Overall transfer level: Needs assistance Equipment used: Rolling walker (2 wheeled);1 person hand held assist Transfers: Sit to/from Stand Sit to Stand: Min guard         General transfer comment: unsafe in stransition to chair, tends to list backward with walker due to fast transition  Ambulation/Gait                 Stairs             Wheelchair Mobility    Modified Rankin (Stroke Patients Only)        Balance Overall balance assessment: Needs assistance Sitting-balance support: Feet supported Sitting balance-Leahy Scale: Good     Standing balance support: Bilateral upper extremity supported;During functional activity Standing balance-Leahy Scale: Fair Standing balance comment: (poor witout walker)                            Cognition Arousal/Alertness: Awake/alert Behavior During Therapy: Flat affect Overall Cognitive Status: Within Functional Limits for tasks assessed                                        Exercises      General Comments General comments (skin integrity, edema, etc.): good responset to being OOB an dlooking at her meal      Pertinent Vitals/Pain Pain Assessment: Faces Faces Pain Scale: Hurts even more Pain Location: HA Pain Descriptors / Indicators: Guarding;Headache    Home Living                      Prior Function            PT Goals (current goals can now be found in the care plan section) Acute Rehab PT Goals Patient Stated Goal: to go home Progress towards PT goals: Progressing toward goals    Frequency  Min 3X/week      PT Plan Current plan remains appropriate    Co-evaluation              AM-PAC PT "6 Clicks" Mobility   Outcome Measure  Help needed turning from your back to your side while in a flat bed without using bedrails?: None Help needed moving from lying on your back to sitting on the side of a flat bed without using bedrails?: A Little Help needed moving to and from a bed to a chair (including a wheelchair)?: A Little Help needed standing up from a chair using your arms (e.g., wheelchair or bedside chair)?: A Little Help needed to walk in hospital room?: A Little Help needed climbing 3-5 steps with a railing? : A Lot 6 Click Score: 18    End of Session   Activity Tolerance: Patient limited by fatigue;Treatment limited secondary to medical complications  (Comment);Patient limited by pain Patient left: in chair;with call bell/phone within reach;with chair alarm set Nurse Communication: Mobility status PT Visit Diagnosis: Unsteadiness on feet (R26.81);Muscle weakness (generalized) (M62.81)     Time: [phone] to 1231) PT Time Calculation (min) (ACUTE ONLY): 15 min  Charges:  $Therapeutic Activity: 8-22 mins                  Ramond Dial 08/18/2019, 3:59 PM   Mee Hives, PT MS Acute Rehab Dept. Number: [place] and [place]

## 2019-08-18 NOTE — Progress Notes (Signed)
PT Cancellation Note  Patient Details Name: Melanie Hall MRN: [id number] DOB: 09-11-1970   Cancelled Treatment:    Reason Eval/Treat Not Completed: Other (comment).  Pt refused therapy, became a bit agitated and stated she wanted to do therapy but just cannot today.  Daughter in and supportive of trying to get pt up.  Will reattempt tomorrow.   Ramond Dial 08/18/2019, 1:48 PM   Mee Hives, PT MS Acute Rehab Dept. Number: [place] and [place]

## 2019-08-18 NOTE — Progress Notes (Signed)
Occupational Therapy Treatment Patient Details Name: Melanie Hall MRN: [id number] DOB: 1971-05-30 Today's Date: 08/18/2019    History of present illness 49 y.o. female with medical history significant of chronic kidney disease stage V, hypertension, hyperlipidemia, diabetes mellitus type 2 who presented to the ER with intermittent chest pain, uncontrolled hypertension. She complains of a gradual onset diffuse headache and R arm weakness. Pt also with hx of monoclonal gammopathy.   OT comments  Pt making gradual progress towards OT goals this session. Pt required MAX encouragement to participate in therapy session and only agreeable to transfer from EOB>recliner with RW. Overall, pt requires MIN A- min guard for functional mobility as pt slightly impulsive with movement needing MIN A for balance and safety. Pt declined UB ADLs but requires MAX A for LB ADLs. Pt continues to c/o tingling in BLEs, education provided on importance of mobility to increase strength and activity tolerance to return to PLOF. DC plan remains appropriate, will follow acutely per POC.    Follow Up Recommendations  Home health OT;Supervision/Assistance - 24 hour    Equipment Recommendations  None recommended by OT    Recommendations for Other Services      Precautions / Restrictions Precautions Precautions: Fall Precaution Comments: Pt is easliy agitated Restrictions Weight Bearing Restrictions: No       Mobility Bed Mobility Overal bed mobility: Needs Assistance Bed Mobility: Supine to Sit Rolling: Min assist   Supine to sit: Min assist     General bed mobility comments: min assist for safety and cues for impulsive behavior  Transfers Overall transfer level: Needs assistance Equipment used: Rolling walker (2 wheeled) Transfers: Sit to/from Stand Sit to Stand: Min guard         General transfer comment: pt impulsively sit>stand from EOB needing MIN A to power into standing; increased effort and  time noted    Balance Overall balance assessment: Needs assistance Sitting-balance support: Feet supported Sitting balance-Leahy Scale: Good     Standing balance support: Bilateral upper extremity supported;During functional activity Standing balance-Leahy Scale: Poor Standing balance comment: reliant on BUE support                           ADL either performed or assessed with clinical judgement   ADL Overall ADL's : Needs assistance/impaired       Grooming Details (indicate cue type and reason): declined grooming tasks   Upper Body Bathing Details (indicate cue type and reason): declined         Lower Body Dressing: Maximal assistance;Bed level Lower Body Dressing Details (indicate cue type and reason): to don socks long sitting in recliner Toilet Transfer: Minimal assistance;Min guard;Ambulation;RW Toilet Transfer Details (indicate cue type and reason): simulated to recliner, declined ambulation to bathroom and use of BSC         Functional mobility during ADLs: Min guard;Minimal assistance;Rolling walker General ADL Comments: pt agreeable to functional mobility from EOB>recliner with RW and min guard - min a to power into standing. pt easily agitated and declined further ADLs     Vision       Perception     Praxis      Cognition Arousal/Alertness: Awake/alert Behavior During Therapy: Flat affect;Agitated Overall Cognitive Status: Within Functional Limits for tasks assessed  General Comments: pt required MAX encouragement to participate and minimally engaged with therapists        Exercises     Shoulder Instructions       General Comments pt dtr present for start of session encouraging pt to participate, however pt began to curse at dtr with dtr abruptly leaving    Pertinent Vitals/ Pain       Pain Assessment: Faces Faces Pain Scale: Hurts even more Pain Location: HA Pain Descriptors /  Indicators: Guarding;Headache Pain Intervention(s): Limited activity within patient's tolerance;Monitored during session;Repositioned;Premedicated before session  Home Living                                          Prior Functioning/Environment              Frequency  Min 2X/week        Progress Toward Goals  OT Goals(current goals can now be found in the care plan section)  Progress towards OT goals: Progressing toward goals  Acute Rehab OT Goals Patient Stated Goal: to go home OT Goal Formulation: With patient Time For Goal Achievement: 08/24/19 Potential to Achieve Goals: Good  Plan Discharge plan remains appropriate    Co-evaluation                 AM-PAC OT "6 Clicks" Daily Activity     Outcome Measure   Help from another person eating meals?: None Help from another person taking care of personal grooming?: A Little Help from another person toileting, which includes using toliet, bedpan, or urinal?: A Little Help from another person bathing (including washing, rinsing, drying)?: A Little Help from another person to put on and taking off regular upper body clothing?: A Little Help from another person to put on and taking off regular lower body clothing?: A Little 6 Click Score: 19    End of Session Equipment Utilized During Treatment: Rolling walker  OT Visit Diagnosis: Unsteadiness on feet (R26.81);Other abnormalities of gait and mobility (R26.89);Muscle weakness (generalized) (M62.81)   Activity Tolerance Other (comment)(limited by agitation and HA)   Patient Left in chair;with call bell/phone within reach   Nurse Communication Mobility status        Time: 8182-9937 OT Time Calculation (min): 25 min  Charges: OT General Charges $OT Visit: 1 Visit OT Treatments $Therapeutic Activity: 8-22 mins  Lanier Clam., COTA/L Acute Rehabilitation Services [phone]   Ihor Gully 08/18/2019, 4:18 PM

## 2019-08-18 NOTE — Progress Notes (Signed)
Hemodialysis completed without issue. UF goal reduced due to cramping. Currently resolved. Patient continues to be tearful. States legs are tingly and she cannot walk. MD aware. Reported off to primary RN.

## 2019-08-18 NOTE — Progress Notes (Signed)
HD initiated via R Chest HD catheter without issue. Labs predrawn and resulted in am. Patient currently without complaints. Some discomfort from avf placement, arm elevated. Continue to monitor.

## 2019-08-18 NOTE — Progress Notes (Signed)
Renal Navigator met with patient and daughter to complete Patient Insurability Assessment form for [employer] in order to complete OP HD referral, since patient is uninsured. Patient appeared frustrated and did not want to speak with Navigator. She gave permission to speak with her daughter, [name], though her daughter was not able to answer all of the questions regarding patient's work history. Patient made little eye contact, but would nod yes/no to questions at times. Her daughter did the best she could in answering form and Navigator faxed for to [employer]. Renal Navigator will continue to follow.  Alphonzo Cruise, [place] Renal Navigator [phone]

## 2019-08-18 NOTE — Progress Notes (Signed)
Hypoglycemic Event  CBG:50 Treatment: D50 25 mL (12.5 gm)  Symptoms: None  Follow-up CBG: Time:-CBG Result-  Possible Reasons for Event: Inadequate meal intake  Comments/MD notified:on call notified    [employer]

## 2019-08-18 NOTE — Progress Notes (Signed)
Patient seen sleeping a lot and refusing to eat or take her medicine. RN educated her on the importance of eating since she is a diabetic and encouraging her to eat as well. Declines any offer given. Will continue to monitor. CBG 93

## 2019-08-18 NOTE — Progress Notes (Signed)
Patient continue to refuse eating something on basis of feeling nauseas. Will continue to monitor.

## 2019-08-19 DIAGNOSIS — D631 Anemia in chronic kidney disease: Secondary | ICD-10-CM | POA: Clinically undetermined

## 2019-08-19 LAB — GLUCOSE, CAPILLARY
Glucose-Capillary: 103 mg/dL — ABNORMAL HIGH (ref 70–99)
Glucose-Capillary: 105 mg/dL — ABNORMAL HIGH (ref 70–99)
Glucose-Capillary: 133 mg/dL — ABNORMAL HIGH (ref 70–99)
Glucose-Capillary: 156 mg/dL — ABNORMAL HIGH (ref 70–99)

## 2019-08-19 LAB — CBC
HCT: 25.8 % — ABNORMAL LOW (ref 36.0–46.0)
Hemoglobin: 7.9 g/dL — ABNORMAL LOW (ref 12.0–15.0)
MCH: 30 pg (ref 26.0–34.0)
MCHC: 30.6 g/dL (ref 30.0–36.0)
MCV: 98.1 fL (ref 80.0–100.0)
Platelets: 158 10*3/uL (ref 150–400)
RBC: 2.63 MIL/uL — ABNORMAL LOW (ref 3.87–5.11)
RDW: 16.7 % — ABNORMAL HIGH (ref 11.5–15.5)
WBC: 11.6 10*3/uL — ABNORMAL HIGH (ref 4.0–10.5)
nRBC: 0 % (ref 0.0–0.2)

## 2019-08-19 LAB — RENAL FUNCTION PANEL
Albumin: 2.5 g/dL — ABNORMAL LOW (ref 3.5–5.0)
Anion gap: 14 (ref 5–15)
BUN: 32 mg/dL — ABNORMAL HIGH (ref 6–20)
CO2: 23 mmol/L (ref 22–32)
Calcium: 8.2 mg/dL — ABNORMAL LOW (ref 8.9–10.3)
Chloride: 99 mmol/L (ref 98–111)
Creatinine, Ser: 4.29 mg/dL — ABNORMAL HIGH (ref 0.44–1.00)
GFR calc Af Amer: 13 mL/min — ABNORMAL LOW (ref >60)
GFR calc non Af Amer: 11 mL/min — ABNORMAL LOW (ref >60)
Glucose, Bld: 105 mg/dL — ABNORMAL HIGH (ref 70–99)
Phosphorus: 5 mg/dL — ABNORMAL HIGH (ref 2.5–4.6)
Potassium: 4.4 mmol/L (ref 3.5–5.1)
Sodium: 136 mmol/L (ref 135–145)

## 2019-08-19 MED ORDER — MAGNESIUM CITRATE PO SOLN
1.0000 | Freq: Once | ORAL | Status: AC
  Administered 2019-08-19: 13:00:00 1 via ORAL
  Filled 2019-08-19: qty 296

## 2019-08-19 NOTE — Progress Notes (Signed)
Pt has been sitting in the recliner since yesterday, I have asked her 3x times already does she want to switch to the bed she stated no. PT came in to work with her and she also refused and stated she would work with them later, they also asked her did she want to get put back into the bed she refused.

## 2019-08-19 NOTE — Progress Notes (Signed)
PROGRESS NOTE    Melanie Hall  GNF:[id number] DOB: 1970-11-28 DOA: 08/08/2019 PCP: [facility], [place]     Brief Narrative:  Patient is a 49 year old female with a medical history significant for CKD 5, hyperlipidemia, type 2 diabetes who presented to [facility], ED for strokelike symptoms. CT/MRI negative. She started having worsening renal function. The nephrology team was consulted and she underwent tunneled HD catheterization on 2/2,renal biopsy and HD on 2/3.CT of the abdomen and pelvis on 2/5 showedpossible pericardial effusion but follow-up echo showed mild pericardial effusion without tamponade. 2/8 had L AV fistula by vasc surg.   New events last 24 hours / Subjective: Evaluated by PT yesterday.  Recommending home health.  Still awaiting outpatient approval for dialysis.  Still feels weak.  Poor appetite, nausea controlled by medication.  Assessment & Plan: Principal Problem: Acute on chronic renal failure ([place]) Appreciate nephrology Monitor renal function panel Avoid nephrotoxins when able Renal diet with fluid restriction Darbepoetin per nephrology  Active Problems:   Deconditioning/weakness             PT/OT             Home health  Type 2 diabetes with nephropathy (HCC) Sliding scale insulin 70/30 tid  HYPERCHOLESTEROLEMIA Continue pravastatin 40 mg daily  GERD (gastroesophageal reflux disease) Protonix 40 mg daily  Accelerated hypertension Coreg 6.25 mg twice daily Cont to hold Norvasc      Hyperkalemia             Resolved after HD             Monitor renal panel  DVT prophylaxis: SCDs Code Status: Full Family Communication: Self Coming From: Home Disposition Plan: Home, requiring home health Barriers to Discharge: Approval for outpatient dialysis  Consultants:    Nephrology  Procedures:   HD tunneled catheter 08/11/19  Renal biopsy 08/12/2019  AV fistula 08/17/19  Antimicrobials:  Anti-infectives (From admission, onward)   Start     Dose/Rate Route Frequency Ordered Stop   08/17/19 0600  cefUROXime (ZINACEF) 1.5 g in sodium chloride 0.9 % 100 mL IVPB     1.5 g 200 mL/hr over 30 Minutes Intravenous On call to O.R. 08/16/19 0949 08/17/19 0700   08/11/19 1104  ceFAZolin (ANCEF) powder       As needed 08/11/19 1104 08/11/19 1104   08/11/19 1101  ceFAZolin (ANCEF) 2-4 GM/100ML-% IVPB    Note to Pharmacy: Arlean Hopping   : cabinet override      08/11/19 1101 08/11/19 2314        Objective: Vitals:   08/18/19 1817 08/18/19 2335 08/19/19 0536 08/19/19 0832  BP: 105/62 105/66 124/69   Pulse: 70 63 67 73  Resp: 16 16 16    Temp: 97.7 F (36.5 C) 97.7 F (36.5 C) 98.3 F (36.8 C)   TempSrc: Oral Oral Oral   SpO2: 99% 98% 97%   Weight:      Height:        Intake/Output Summary (Last 24 hours) at 08/19/2019 1014 Last data filed at 08/19/2019 0300 Gross per 24 hour  Intake 515 ml  Output 0 ml  Net 515 ml   Filed Weights   08/18/19 0500 08/18/19 0705 08/18/19 1114  Weight: 82 kg 84.1 kg 82.1 kg    Examination:  General exam: Appears calm and comfortable  Respiratory system: Clear to auscultation. Respiratory effort normal. No respiratory distress. No conversational dyspnea.  Cardiovascular system: S1 & S2 heard, RRR. 2+ pitting  b/l LE edema. Gastrointestinal system: Abdomen is nondistended, soft and nontender. Normal bowel sounds heard. Central nervous system: Alert and oriented.  Extremities: Symmetric in appearance  Skin: No rashes, lesions or ulcers on exposed skin  Psychiatry: Mood normal & affect flat   Data Reviewed: I have personally reviewed following labs and imaging studies  CBC: Recent Labs  Lab 08/14/19 0755 08/14/19 0755 08/15/19 0737 08/16/19 0224 08/17/19 0720 08/18/19 0543 08/19/19 0514  WBC 7.7  --   7.8 8.0  --  11.1* 11.6*  HGB 7.7*   < > 7.4* 7.9* 8.5* 8.7* 7.9*  HCT 23.6*   < > 22.9* 25.0* 25.0* 27.6* 25.8*  MCV 92.2  --  93.5 93.3  --  95.8 98.1  PLT 165  --  158 168  --  179 158   < > = values in this interval not displayed.   Basic Metabolic Panel: Recent Labs  Lab 08/13/19 0721 08/14/19 0755 08/15/19 0736 08/15/19 0736 08/16/19 0224 08/17/19 0720 08/17/19 1028 08/18/19 0543 08/19/19 0514  NA 136   < > 130*   < > 134* 131* 133* 134* 136  K 4.0   < > 3.8   < > 4.5 4.9 5.5* 5.7* 4.4  CL 104   < > 95*   < > 97* 96* 99 98 99  CO2 18*   < > 22  --  24  --  20* 20* 23  GLUCOSE 125*   < > 240*   < > 179* 204* 222* 58* 105*  BUN 77*   < > 67*   < > 35* 51* 53* 63* 32*  CREATININE 6.90*   < > 5.97*   < > 4.17* 5.90* 5.82* 6.55* 4.29*  CALCIUM 7.6*   < > 7.6*  --  8.2*  --  8.1* 8.5* 8.2*  PHOS 7.0*  --  6.6*  --   --   --  7.0* 7.6* 5.0*   < > = values in this interval not displayed.   GFR: Estimated Creatinine Clearance: 16.1 mL/min (A) (by C-G formula based on SCr of 4.29 mg/dL (H)). Liver Function Tests: Recent Labs  Lab 08/13/19 0721 08/15/19 0736 08/17/19 1028 08/18/19 0543 08/19/19 0514  ALBUMIN 1.9* 1.9* 2.5* 2.5* 2.5*   CBG: Recent Labs  Lab 08/18/19 0630 08/18/19 1146 08/18/19 1648 08/18/19 2146 08/19/19 0537  GLUCAP 148* 76 131* 138* 103*   Recent Results (from the past 240 hour(s))  Surgical pcr screen     Status: None   Collection Time: 08/16/19  9:18 PM   Specimen: Nasal Mucosa; Nasal Swab  Result Value Ref Range Status   MRSA, PCR NEGATIVE NEGATIVE Final   Staphylococcus aureus NEGATIVE NEGATIVE Final    Comment: (NOTE) The Xpert SA Assay (FDA approved for NASAL specimens in patients 66 years of age and older), is one component of a comprehensive surveillance program. It is not intended to diagnose infection nor to guide or monitor treatment. Performed at [facility], [place] [address] [place], [place] [address]      Scheduled  Meds: . carvedilol  6.25 mg Oral BID WC  . Chlorhexidine Gluconate Cloth  6 each Topical Q0600  . Chlorhexidine Gluconate Cloth  6 each Topical Q0600  . darbepoetin (ARANESP) injection - DIALYSIS  100 mcg Intravenous Q Tue-HD  . feeding supplement (NEPRO CARB STEADY)  237 mL Oral Q24H  . ferric citrate  420 mg Oral TID WC  . gabapentin  100 mg  Oral BID  . insulin aspart  0-9 Units Subcutaneous TID WC  . insulin aspart protamine- aspart  15 Units Subcutaneous TID AC  . levothyroxine  25 mcg Oral Daily  . midodrine  10 mg Oral Once  . multivitamin  1 tablet Oral QHS  . pantoprazole  40 mg Oral QHS  . pravastatin  40 mg Oral q1800   Continuous Infusions: . sodium chloride    . sodium chloride    . ferric gluconate (FERRLECIT/NULECIT) IV 125 mg (08/18/19 0830)     LOS: 11 days    Time spent: 20 minutes   Shelda Pal, DO [facility] 08/19/2019, 10:14 AM   Available via Epic secure chat 7am-7pm After these hours, please refer to coverage provider listed on [email or web address]

## 2019-08-19 NOTE — Progress Notes (Signed)
PT Cancellation Note  Patient Details Name: Melanie Hall MRN: [id number] DOB: Dec 05, 1970   Cancelled Treatment:    Reason Eval/Treat Not Completed: Other (comment).  Pt reports being up in chair since yesterday, and would not let PT assist her to bed.  Would not answer PT about whether she is in pain with her HA, but states she may do therapy later today.  Retry as time and pt allow.   Ramond Dial 08/19/2019, 12:10 PM   Mee Hives, PT MS Acute Rehab Dept. Number: [place] and [place]

## 2019-08-19 NOTE — Progress Notes (Signed)
Physical Therapy Treatment Patient Details Name: Melanie Hall MRN: [id number] DOB: [birth year] Today's Date: 08/19/2019    History of Present Illness 49 y.o. female with medical history significant of chronic kidney disease stage V, hypertension, hyperlipidemia, diabetes mellitus type 2 who presented to the ER with intermittent chest pain, uncontrolled hypertension. She complains of a gradual onset diffuse headache and R arm weakness. Pt also with hx of monoclonal gammopathy.    PT Comments    Pt was seen for transfer to bed but would initially not agree to therapy.  This is still not a walk of any distance but is able to maneuver with no more than min guard for safety.  Pt gets into bed with guarding and noted her ability to position in sidelying with her legs flexed.  Follow up with pt at another time to work on gait since this session was fairly limited by her not having been in bed and ready to go.   Follow Up Recommendations  Home health PT;Supervision - Intermittent     Equipment Recommendations  None recommended by PT    Recommendations for Other Services       Precautions / Restrictions Precautions Precautions: Fall Precaution Comments: gets agitated Restrictions Weight Bearing Restrictions: No    Mobility  Bed Mobility Overal bed mobility: Needs Assistance Bed Mobility: Sit to Sidelying         Sit to sidelying: Min assist General bed mobility comments: min assist and then helped her to get cleaned from bowel movement  Transfers Overall transfer level: Needs assistance Equipment used: Rolling walker (2 wheeled) Transfers: Sit to/from Stand Sit to Stand: Min guard            Ambulation/Gait         Gait velocity: reduced   General Gait Details: steps were related to return to bed   Stairs             Wheelchair Mobility    Modified Rankin (Stroke Patients Only)       Balance     Sitting balance-Leahy Scale: Good       Standing  balance-Leahy Scale: Fair                              Cognition Arousal/Alertness: Lethargic Behavior During Therapy: Flat affect Overall Cognitive Status: Within Functional Limits for tasks assessed                                 General Comments: pt more interactive when PT returned today and could help her return to bed      Exercises      General Comments General comments (skin integrity, edema, etc.): pt has been up in the chair overnight but is ready to be returned to bed and required no extra help than usual.       Pertinent Vitals/Pain Pain Assessment: Faces Faces Pain Scale: Hurts little more Pain Location: HA Pain Descriptors / Indicators: Grimacing Pain Intervention(s): Monitored during session;Premedicated before session;Repositioned    Home Living                      Prior Function            PT Goals (current goals can now be found in the care plan section) Acute Rehab PT Goals Patient Stated Goal: to go home  Frequency    Min 3X/week      PT Plan Current plan remains appropriate    Co-evaluation              AM-PAC PT "6 Clicks" Mobility   Outcome Measure  Help needed turning from your back to your side while in a flat bed without using bedrails?: None Help needed moving from lying on your back to sitting on the side of a flat bed without using bedrails?: A Little Help needed moving to and from a bed to a chair (including a wheelchair)?: A Little Help needed standing up from a chair using your arms (e.g., wheelchair or bedside chair)?: A Little Help needed to walk in hospital room?: A Little Help needed climbing 3-5 steps with a railing? : A Lot 6 Click Score: 18    End of Session   Activity Tolerance: Patient limited by fatigue Patient left: in bed;with call bell/phone within reach;with bed alarm set Nurse Communication: Mobility status PT Visit Diagnosis: Unsteadiness on feet (R26.81);Muscle  weakness (generalized) (M62.81)     Time: 6116-4353 PT Time Calculation (min) (ACUTE ONLY): 14 min  Charges:  $Therapeutic Activity: 8-22 mins                     Ramond Dial 08/19/2019, 2:44 PM  Mee Hives, PT MS Acute Rehab Dept. Number: [place] and [place]

## 2019-08-19 NOTE — Progress Notes (Signed)
Renal Navigator has not received call back from [facility] regarding acceptance for OP HD treatment and called to follow up. Patient is uninsured, which is causing delay. Message left requesting a return call from financial team.  Alphonzo Cruise, [place] Renal Navigator [phone]

## 2019-08-19 NOTE — Progress Notes (Signed)
Patient ID: Melanie Hall, female   DOB: Jun 09, 1971, 49 y.o.   MRN: [id number]  [facility] Progress Note   Assessment/ Plan:   1.  Volume overload/nephrotic syndrome: Secondary to progressive chronic kidney disease from renal biopsy-proven diabetic nephrosclerosis, now ESRD on hemodialysis. 2. ESRD: New start on hemodialysis with renal biopsy showing diabetic kidney disease with advanced tubular atrophy/interstitial sclerosis.  Continue hemodialysis on a TTS schedule at this time as effort is underway for outpatient dialysis unit placement.  She is status post left brachiocephalic fistula on 0/0/1749 and with a functioning right IJ TDC catheter. 3. Anemia: Status post PRBC transfusion earlier, on intravenous iron/ESA for iron deficiency. 4. CKD-MBD: Mild hyperphosphatemia noted, continue to follow with hemodialysis/[place]. 5. Nutrition: Continue renal diet with fluid restriction, begin renal multivitamin/N[place]ro. 6. Hypertension: Blood pressure maintained within acceptable range on midodrine, will continue to monitor on dialysis.  Subjective:   Reports that she feels tired and would like to rest some more.  Challenges with placement to outpatient dialysis unit noted and PT/OT notes reviewed.   Objective:   BP 124/69 (BP Location: Right Arm)   Pulse 73   Temp 98.3 F (36.8 C) (Oral)   Resp 16   Ht 5\' 3"  (1.6 m)   Wt 82.1 kg   LMP 12/17/2016   SpO2 97%   BMI 32.06 kg/m   Physical Exam: Gen: Appears comfortable resting in bed, not in distress CVS: Pulse regular rhythm, normal rate, S1 and S2 normal.  Right IJ TDC Resp: Clear to auscultation with decreased breath sounds over bases, no rales/rhonchi Abd: Soft, flat, nontender Ext: 1+ lower extremity edema, left BCF with audible bruit  Labs: BMET Recent Labs  Lab 08/13/19 0721 08/13/19 0721 08/14/19 0755 08/15/19 0736 08/16/19 0224 08/17/19 0720 08/17/19 1028 08/18/19 0543 08/19/19 0514  NA 136   < > 136 130*  134* 131* 133* 134* 136  K 4.0   < > 3.5 3.8 4.5 4.9 5.5* 5.7* 4.4  CL 104   < > 100 95* 97* 96* 99 98 99  CO2 18*  --  26 22 24   --  20* 20* 23  GLUCOSE 125*   < > 134* 240* 179* 204* 222* 58* 105*  BUN 77*   < > 47* 67* 35* 51* 53* 63* 32*  CREATININE 6.90*   < > 4.89* 5.97* 4.17* 5.90* 5.82* 6.55* 4.29*  CALCIUM 7.6*  --  7.6* 7.6* 8.2*  --  8.1* 8.5* 8.2*  PHOS 7.0*  --   --  6.6*  --   --  7.0* 7.6* 5.0*   < > = values in this interval not displayed.   CBC Recent Labs  Lab 08/15/19 0737 08/15/19 0737 08/16/19 0224 08/17/19 0720 08/18/19 0543 08/19/19 0514  WBC 7.8  --  8.0  --  11.1* 11.6*  HGB 7.4*   < > 7.9* 8.5* 8.7* 7.9*  HCT 22.9*   < > 25.0* 25.0* 27.6* 25.8*  MCV 93.5  --  93.3  --  95.8 98.1  PLT 158  --  168  --  179 158   < > = values in this interval not displayed.     Medications:    . carvedilol  6.25 mg Oral BID WC  . Chlorhexidine Gluconate Cloth  6 each Topical Q0600  . Chlorhexidine Gluconate Cloth  6 each Topical Q0600  . darbepoetin (ARANESP) injection - DIALYSIS  100 mcg Intravenous Q Tue-HD  . feeding supplement (NEPRO CARB  STEADY)  237 mL Oral Q24H  . ferric citrate  420 mg Oral TID WC  . gabapentin  100 mg Oral BID  . insulin aspart  0-9 Units Subcutaneous TID WC  . insulin aspart protamine- aspart  15 Units Subcutaneous TID AC  . levothyroxine  25 mcg Oral Daily  . midodrine  10 mg Oral Once  . multivitamin  1 tablet Oral QHS  . pantoprazole  40 mg Oral QHS  . pravastatin  40 mg Oral q1800   Elmarie Shiley, MD 08/19/2019, 10:09 AM

## 2019-08-19 NOTE — Progress Notes (Signed)
  Progress Note    08/19/2019 9:14 AM 2 Days Post-Op  Subjective:  Numbness L hand   Vitals:   08/19/19 0536 08/19/19 0832  BP: 124/69   Pulse: 67 73  Resp: 16   Temp: 98.3 F (36.8 C)   SpO2: 97%    Physical Exam: Lungs:  Non labored Incisions:  L arm incision c/d/i Extremities:  Palpable L radial pulse; palpable thrill A brachiocephalic fistula Neurologic: A&O  CBC    Component Value Date/Time   WBC 11.6 (H) 08/19/2019 0514   RBC 2.63 (L) 08/19/2019 0514   HGB 7.9 (L) 08/19/2019 0514   HCT 25.8 (L) 08/19/2019 0514   HCT 18.4 (L) 08/09/2019 1317   PLT 158 08/19/2019 0514   MCV 98.1 08/19/2019 0514   MCH 30.0 08/19/2019 0514   MCHC 30.6 08/19/2019 0514   RDW 16.7 (H) 08/19/2019 0514   LYMPHSABS 1.3 08/08/2019 1607   MONOABS 1.3 (H) 08/08/2019 1607   EOSABS 0.1 08/08/2019 1607   BASOSABS 0.1 08/08/2019 1607    BMET    Component Value Date/Time   NA 136 08/19/2019 0514   K 4.4 08/19/2019 0514   CL 99 08/19/2019 0514   CO2 23 08/19/2019 0514   GLUCOSE 105 (H) 08/19/2019 0514   BUN 32 (H) 08/19/2019 0514   CREATININE 4.29 (H) 08/19/2019 0514   CALCIUM 8.2 (L) 08/19/2019 0514   GFRNONAA 11 (L) 08/19/2019 0514   GFRAA 13 (L) 08/19/2019 0514    INR    Component Value Date/Time   INR 1.2 08/11/2019 0522     Intake/Output Summary (Last 24 hours) at 08/19/2019 0914 Last data filed at 08/19/2019 0300 Gross per 24 hour  Intake 515 ml  Output 0 ml  Net 515 ml     Assessment/Plan:  49 y.o. female is s/p L brachiocephalic fistula creation 2 Days Post-Op   Despite numbness; she is perfusing L hand with palpable L radial pulse and palmer arch signal by doppler Continue to exercise L hand Fistula duplex in 4-6 weeks; office will arrange   Dagoberto Ligas, PA-C [facility] [phone] 08/19/2019 9:14 AM

## 2019-08-19 NOTE — TOC Initial Note (Signed)
Transition of Care [facility]) - Initial/Assessment Note    Patient Details  Name: Melanie Hall MRN: [id number] Date of Birth: 01/22/1971  Transition of [facility]) CM/SW Contact:    Marilu Favre, RN Phone Number: 08/19/2019, 1:28 PM  Clinical Narrative:                 Patient from home with friend. Has walker and 3 in1 already. Referral for home health PT given to [employer] with [place].   Patient active with [facility] for PCP.   Expected Discharge Plan: [place] Barriers to Discharge: Waiting for outpatient dialysis   Patient Goals and CMS Choice Patient states their goals for this hospitalization and ongoing recovery are:: to return home CMS Medicare.gov Compare Post Acute Care list provided to:: Patient Choice offered to / list presented to : Patient  Expected Discharge Plan and Services Expected Discharge Plan: [place] Choice: [place] arrangements for the past 2 months: Single Family Home                 DME Arranged: N/A         HH Arranged: NA          Prior Living Arrangements/Services Living arrangements for the past 2 months: Single Family Home Lives with:: Roommate(friend) Patient language and need for interpreter reviewed:: Yes        Need for Family Participation in Patient Care: Yes (Comment) Care giver support system in place?: Yes (comment)   Criminal Activity/Legal Involvement Pertinent to Current Situation/Hospitalization: No - Comment as needed  Activities of Daily Living Home Assistive Devices/Equipment: Walker (specify type) ADL Screening (condition at time of admission) Patient's cognitive ability adequate to safely complete daily activities?: Yes Is the patient deaf or have difficulty hearing?: No Does the patient have difficulty seeing, even when wearing glasses/contacts?: No Does the patient have difficulty concentrating, remembering, or  making decisions?: No Patient able to express need for assistance with ADLs?: Yes Does the patient have difficulty dressing or bathing?: No Independently performs ADLs?: Yes (appropriate for developmental age) Does the patient have difficulty walking or climbing stairs?: Yes Weakness of Legs: Both Weakness of Arms/Hands: None  Permission Sought/Granted   Permission granted to share information with : No              Emotional Assessment Appearance:: Appears stated age Attitude/Demeanor/Rapport: Engaged Affect (typically observed): Accepting Orientation: : Oriented to Self, Oriented to Place, Oriented to  Time, Oriented to Situation Alcohol / Substance Use: Not Applicable Psych Involvement: No (comment)  Admission diagnosis:  Atypical chest pain [R07.89] Right arm weakness [R29.898] Acute on chronic renal failure (HCC) [N17.9, N18.9] Acute renal failure superimposed on chronic kidney disease, unspecified CKD stage, unspecified acute renal failure type ([place]) [N17.9, N18.9] Patient Active Problem List   Diagnosis Date Noted  . Anemia in chronic kidney disease 08/19/2019  . Accelerated hypertension 08/08/2019  . Right arm weakness 08/08/2019  . MGUS (monoclonal gammopathy of unknown significance) 07/23/2019  . Diarrhea   . AKI (acute kidney injury) ([place]) 02/28/2019  . Acute on chronic renal failure ([place]) 02/28/2019  . GERD (gastroesophageal reflux disease) 02/28/2019  . Closed fracture of left femur ([place]) 10/28/2018  . Hypertension associated with diabetes ([place]) 10/28/2018  . Sprain of left ankle   . Emphysematous cystitis 08/26/2018  . Vaginosis 08/26/2018  . Hypokalemia 08/26/2018  . Normocytic anemia 08/26/2018  .  Closed fracture of right ankle 11/06/2017  . IRREGULAR MENSES 09/14/2009  . BURN, FIRST DEGREE, ARM 09/14/2009  . HYPERCHOLESTEROLEMIA 11/19/2008  . PARONYCHIA, RIGHT GREAT TOE 07/30/2008  . ABSCESS, TOOTH 03/31/2008  . Type 2 diabetes with nephropathy  ([place]) 07/02/2007  . CATARACTS, BILATERAL 07/02/2007   PCP:  Health, [place]:   [facility] Dept. Linna Hoff, [place] [address] [place] [address] Phone: [phone] Fax: [place], [place] [address] [place] [address] Phone: [phone] Fax: [phone]     Social Determinants of Health (SDOH) Interventions    Readmission Risk Interventions No flowsheet data found.

## 2019-08-20 ENCOUNTER — Encounter (HOSPITAL_COMMUNITY): Payer: Self-pay

## 2019-08-20 ENCOUNTER — Encounter (HOSPITAL_COMMUNITY)
Admission: RE | Admit: 2019-08-20 | Discharge: 2019-08-20 | Disposition: A | Discharge status: Home / Self Care | Payer: Self-pay | Source: Ambulatory Visit | Attending: Nephrology | Admitting: Nephrology

## 2019-08-20 LAB — RENAL FUNCTION PANEL
Albumin: 2.4 g/dL — ABNORMAL LOW (ref 3.5–5.0)
Anion gap: 15 (ref 5–15)
BUN: 40 mg/dL — ABNORMAL HIGH (ref 6–20)
CO2: 23 mmol/L (ref 22–32)
Calcium: 8.1 mg/dL — ABNORMAL LOW (ref 8.9–10.3)
Chloride: 95 mmol/L — ABNORMAL LOW (ref 98–111)
Creatinine, Ser: 5.48 mg/dL — ABNORMAL HIGH (ref 0.44–1.00)
GFR calc Af Amer: 10 mL/min — ABNORMAL LOW (ref >60)
GFR calc non Af Amer: 8 mL/min — ABNORMAL LOW (ref >60)
Glucose, Bld: 165 mg/dL — ABNORMAL HIGH (ref 70–99)
Phosphorus: 5.3 mg/dL — ABNORMAL HIGH (ref 2.5–4.6)
Potassium: 4.4 mmol/L (ref 3.5–5.1)
Sodium: 133 mmol/L — ABNORMAL LOW (ref 135–145)

## 2019-08-20 LAB — CBC
HCT: 24.9 % — ABNORMAL LOW (ref 36.0–46.0)
Hemoglobin: 7.5 g/dL — ABNORMAL LOW (ref 12.0–15.0)
MCH: 29.6 pg (ref 26.0–34.0)
MCHC: 30.1 g/dL (ref 30.0–36.0)
MCV: 98.4 fL (ref 80.0–100.0)
Platelets: 133 10*3/uL — ABNORMAL LOW (ref 150–400)
RBC: 2.53 MIL/uL — ABNORMAL LOW (ref 3.87–5.11)
RDW: 16.7 % — ABNORMAL HIGH (ref 11.5–15.5)
WBC: 12.8 10*3/uL — ABNORMAL HIGH (ref 4.0–10.5)
nRBC: 0.2 % (ref 0.0–0.2)

## 2019-08-20 LAB — GLUCOSE, CAPILLARY
Glucose-Capillary: 156 mg/dL — ABNORMAL HIGH (ref 70–99)
Glucose-Capillary: 101 mg/dL — ABNORMAL HIGH (ref 70–99)
Glucose-Capillary: 124 mg/dL — ABNORMAL HIGH (ref 70–99)
Glucose-Capillary: 148 mg/dL — ABNORMAL HIGH (ref 70–99)

## 2019-08-20 MED ORDER — HEPARIN SODIUM (PORCINE) 1000 UNIT/ML IJ SOLN
INTRAMUSCULAR | Status: AC
  Administered 2019-08-20: 09:00:00 3200 [IU]
  Filled 2019-08-20: qty 4

## 2019-08-20 MED ORDER — SENNOSIDES-DOCUSATE SODIUM 8.6-50 MG PO TABS: 1.0000 | ORAL_TABLET | Freq: Two times a day (BID) | ORAL | Status: DC | PRN

## 2019-08-20 MED ORDER — FLEET ENEMA 7-19 GM/118ML RE ENEM: 1.0000 | ENEMA | Freq: Every day | RECTAL | Status: DC | PRN

## 2019-08-20 NOTE — Procedures (Signed)
Patient seen on Hemodialysis. BP 108/68   Pulse 69   Temp 99.2 F (37.3 C) (Oral)   Resp 17   Ht 5\' 3"  (1.6 m)   Wt 83.4 kg   LMP 12/17/2016   SpO2 96%   BMI 32.57 kg/m   QB 400, UF goal 2.5L Tolerating treatment without complaints at this time.   Elmarie Shiley MD [facility] Office # [phone] Pager # [phone] 9:56 AM

## 2019-08-20 NOTE — Progress Notes (Signed)
PROGRESS NOTE    Melanie Hall  WYO:[id number] DOB: 10/31/1970 DOA: 08/08/2019 PCP: [facility] [place]     Brief Narrative:  Patient is a 49 year old female with a medical history significant for CKD 5, hyperlipidemia, type 2 diabetes who presented to [facility], ED for strokelike symptoms. CT/MRI negative. She started having worsening renal function. The nephrology team was consulted and she underwent tunneled HD catheterization on 2/2,renal biopsy and HD on 2/3.CT of the abdomen and pelvis on 2/5 showedpossible pericardial effusion but follow-up echo showed mild pericardial effusion without tamponade.2/8 had L AV fistula by vasc surg.  She is uninsured and waiting for discharge once outpatient hemodialysis has been set up.   New events last 24 hours / Subjective: Still waiting for outpatient approval.  Still weak, trying to work with physical therapy but not quite successful.  Diet is poor.  Medicines are helpful.  She has not had a bowel movement, cannot take magnesium citrate due to it being too sweet.  She also has not urinated as frequently as she would like.  Assessment & Plan:  Principal Problem: Acute on chronic renal failure ([place]) Appreciate nephrology Monitor renal function panel Avoid nephrotoxins when able Renal diet with fluid restriction Darbepoetin per nephrology  TTS HD  Active Problems: Deconditioning/weakness PT/OT Home health  Encouraged maximum effort  Type 2 diabetes with nephropathy (HCC) Sliding scale insulin 70/30 tid  HYPERCHOLESTEROLEMIA Continue pravastatin 40 mg daily  GERD (gastroesophageal reflux disease) Protonix 40 mg daily  Accelerated hypertension Coreg 6.25 mg twice daily Cont to hold Norvasc  Hyperkalemia Resolved after  HD Monitor renal panel    Constipation  Add Senokot twice daily as needed  Add fleets enema as needed     Urinary retention  Bladder scan  DVT prophylaxis:SCDs Code Status:Full Family Communication:Self Coming From:Home Disposition Plan:Home, requiring home health Barriers to Discharge:Approval for outpatient dialysis  Consultants:  Nephrology  Procedures:  HD tunneled catheter2/2/21  Renal biopsy 08/12/2019  AV fistula2/8/21  Antimicrobials:  Anti-infectives (From admission, onward)   Start     Dose/Rate Route Frequency Ordered Stop   08/17/19 0600  cefUROXime (ZINACEF) 1.5 g in sodium chloride 0.9 % 100 mL IVPB     1.5 g 200 mL/hr over 30 Minutes Intravenous On call to O.R. 08/16/19 0949 08/17/19 0700   08/11/19 1104  ceFAZolin (ANCEF) powder       As needed 08/11/19 1104 08/11/19 1104   08/11/19 1101  ceFAZolin (ANCEF) 2-4 GM/100ML-% IVPB    Note to Pharmacy: Arlean Hopping   : cabinet override      08/11/19 1101 08/11/19 2314        Objective: Vitals:   08/20/19 0800 08/20/19 0830 08/20/19 0900 08/20/19 0930  BP: 105/61 104/65 (!) 98/56 108/68  Pulse: 65 68 66 69  Resp:      Temp:      TempSrc:      SpO2:      Weight:      Height:        Intake/Output Summary (Last 24 hours) at 08/20/2019 1003 Last data filed at 08/20/2019 0300 Gross per 24 hour  Intake 240 ml  Output --  Net 240 ml   Filed Weights   08/18/19 1114 08/20/19 0554 08/20/19 0713  Weight: 82.1 kg 82.5 kg 83.4 kg    Examination:  General exam: Appears calm Respiratory system: Clear to auscultation. Respiratory effort normal. No respiratory distress. No conversational dyspnea.  Cardiovascular system: S1 & S2 heard, RRR.  No murmurs. + 2+ bilateral pitting edema tapering at the proximal third of the tibia bilaterally Gastrointestinal system: Abdomen is nondistended, soft and diffusely tender to moderate palpation. Central nervous system: Alert and oriented.   Extremities: Symmetric in appearance, no calf tenderness bilaterally Skin: No rashes, lesions or ulcers on exposed skin  Psychiatry: Judgement and insight appear normal.  Flat affect.  Data Reviewed: I have personally reviewed following labs and imaging studies  CBC: Recent Labs  Lab 08/15/19 0737 08/15/19 0737 08/16/19 0224 08/17/19 0720 08/18/19 0543 08/19/19 0514 08/20/19 0131  WBC 7.8  --  8.0  --  11.1* 11.6* 12.8*  HGB 7.4*   < > 7.9* 8.5* 8.7* 7.9* 7.5*  HCT 22.9*   < > 25.0* 25.0* 27.6* 25.8* 24.9*  MCV 93.5  --  93.3  --  95.8 98.1 98.4  PLT 158  --  168  --  179 158 133*   < > = values in this interval not displayed.   Basic Metabolic Panel: Recent Labs  Lab 08/15/19 0736 08/15/19 0736 08/16/19 0224 08/16/19 0224 08/17/19 0720 08/17/19 1028 08/18/19 0543 08/19/19 0514 08/20/19 0131  NA 130*   < > 134*   < > 131* 133* 134* 136 133*  K 3.8   < > 4.5   < > 4.9 5.5* 5.7* 4.4 4.4  CL 95*   < > 97*   < > 96* 99 98 99 95*  CO2 22   < > 24  --   --  20* 20* 23 23  GLUCOSE 240*   < > 179*   < > 204* 222* 58* 105* 165*  BUN 67*   < > 35*   < > 51* 53* 63* 32* 40*  CREATININE 5.97*   < > 4.17*   < > 5.90* 5.82* 6.55* 4.29* 5.48*  CALCIUM 7.6*   < > 8.2*  --   --  8.1* 8.5* 8.2* 8.1*  PHOS 6.6*  --   --   --   --  7.0* 7.6* 5.0* 5.3*   < > = values in this interval not displayed.   GFR: Estimated Creatinine Clearance: 12.7 mL/min (A) (by C-G formula based on SCr of 5.48 mg/dL (H)). Liver Function Tests: Recent Labs  Lab 08/15/19 0736 08/17/19 1028 08/18/19 0543 08/19/19 0514 08/20/19 0131  ALBUMIN 1.9* 2.5* 2.5* 2.5* 2.4*   Recent Labs  Lab 08/19/19 0537 08/19/19 1106 08/19/19 1608 08/19/19 2137 08/20/19 0556  GLUCAP 103* 105* 133* 156* 156*   Recent Results (from the past 240 hour(s))  Surgical pcr screen     Status: None   Collection Time: 08/16/19  9:18 PM   Specimen: Nasal Mucosa; Nasal Swab  Result Value Ref Range Status   MRSA, PCR  NEGATIVE NEGATIVE Final   Staphylococcus aureus NEGATIVE NEGATIVE Final    Comment: (NOTE) The Xpert SA Assay (FDA approved for NASAL specimens in patients 59 years of age and older), is one component of a comprehensive surveillance program. It is not intended to diagnose infection nor to guide or monitor treatment. Performed at [facility], [place] [address] [place], [place] [address]       Radiology Studies: No results found.   Scheduled Meds: . carvedilol  6.25 mg Oral BID WC  . Chlorhexidine Gluconate Cloth  6 each Topical Q0600  . Chlorhexidine Gluconate Cloth  6 each Topical Q0600  . darbepoetin (ARANESP) injection - DIALYSIS  100 mcg Intravenous Q Tue-HD  . feeding  supplement (NEPRO CARB STEADY)  237 mL Oral Q24H  . ferric citrate  420 mg Oral TID WC  . gabapentin  100 mg Oral BID  . insulin aspart  0-9 Units Subcutaneous TID WC  . insulin aspart protamine- aspart  15 Units Subcutaneous TID AC  . levothyroxine  25 mcg Oral Daily  . midodrine  10 mg Oral Once  . multivitamin  1 tablet Oral QHS  . pantoprazole  40 mg Oral QHS  . pravastatin  40 mg Oral q1800   Continuous Infusions: . sodium chloride    . sodium chloride    . ferric gluconate (FERRLECIT/NULECIT) IV 125 mg (08/18/19 0830)     LOS: 12 days    Time spent: 20 minutes   Shelda Pal, DO [facility] 08/20/2019, 10:03 AM   Available via Epic secure chat 7am-7pm After these hours, please refer to coverage provider listed on [email or web address]

## 2019-08-20 NOTE — Discharge Planning (Signed)
Spoke with patient's daughter to get additional information required by [facility] for financial clearance. Patient has been medically cleared by [facility] pending financial clearance.   Pam Mccollum [phone]

## 2019-08-20 NOTE — Progress Notes (Signed)
OT Cancellation Note  Patient Details Name: Melanie Hall MRN: [id number] DOB: 06-15-1971   Cancelled Treatment:    Reason Eval/Treat Not Completed: Patient at procedure or test/ unavailable;Other (comment) pt out of room at HD; will check back as time allows for OT session.   Lanier Clam., COTA/L Acute Rehabilitation Services [phone]   Ihor Gully 08/20/2019, 11:30 AM

## 2019-08-21 LAB — RENAL FUNCTION PANEL
Albumin: 2.2 g/dL — ABNORMAL LOW (ref 3.5–5.0)
Anion gap: 14 (ref 5–15)
BUN: 25 mg/dL — ABNORMAL HIGH (ref 6–20)
CO2: 26 mmol/L (ref 22–32)
Calcium: 8 mg/dL — ABNORMAL LOW (ref 8.9–10.3)
Chloride: 96 mmol/L — ABNORMAL LOW (ref 98–111)
Creatinine, Ser: 3.98 mg/dL — ABNORMAL HIGH (ref 0.44–1.00)
GFR calc Af Amer: 14 mL/min — ABNORMAL LOW (ref >60)
GFR calc non Af Amer: 12 mL/min — ABNORMAL LOW (ref >60)
Glucose, Bld: 120 mg/dL — ABNORMAL HIGH (ref 70–99)
Phosphorus: 3.7 mg/dL (ref 2.5–4.6)
Potassium: 3.9 mmol/L (ref 3.5–5.1)
Sodium: 136 mmol/L (ref 135–145)

## 2019-08-21 LAB — CBC
HCT: 25.2 % — ABNORMAL LOW (ref 36.0–46.0)
Hemoglobin: 7.8 g/dL — ABNORMAL LOW (ref 12.0–15.0)
MCH: 30 pg (ref 26.0–34.0)
MCHC: 31 g/dL (ref 30.0–36.0)
MCV: 96.9 fL (ref 80.0–100.0)
Platelets: 126 10*3/uL — ABNORMAL LOW (ref 150–400)
RBC: 2.6 MIL/uL — ABNORMAL LOW (ref 3.87–5.11)
RDW: 16.8 % — ABNORMAL HIGH (ref 11.5–15.5)
WBC: 11.8 10*3/uL — ABNORMAL HIGH (ref 4.0–10.5)
nRBC: 0.3 % — ABNORMAL HIGH (ref 0.0–0.2)

## 2019-08-21 LAB — GLUCOSE, CAPILLARY
Glucose-Capillary: 107 mg/dL — ABNORMAL HIGH (ref 70–99)
Glucose-Capillary: 117 mg/dL — ABNORMAL HIGH (ref 70–99)
Glucose-Capillary: 96 mg/dL (ref 70–99)
Glucose-Capillary: 114 mg/dL — ABNORMAL HIGH (ref 70–99)

## 2019-08-21 LAB — SURGICAL PATHOLOGY

## 2019-08-21 MED ORDER — METOCLOPRAMIDE HCL 5 MG/ML IJ SOLN
10.0000 mg | Freq: Once | INTRAMUSCULAR | Status: AC
  Administered 2019-08-21: 10 mg via INTRAVENOUS
  Filled 2019-08-21: qty 2

## 2019-08-21 MED ORDER — DOCUSATE SODIUM 100 MG PO CAPS
100.0000 mg | ORAL_CAPSULE | Freq: Two times a day (BID) | ORAL | Status: DC | PRN
  Administered 2019-08-24: 23:00:00 100 mg via ORAL
  Filled 2019-08-21: qty 1

## 2019-08-21 MED ORDER — PRO-STAT SUGAR FREE PO LIQD
30.0000 mL | Freq: Three times a day (TID) | ORAL | Status: DC
  Administered 2019-08-21 (×2): 30 mL via ORAL
  Filled 2019-08-21 (×7): qty 30

## 2019-08-21 NOTE — Discharge Planning (Signed)
Just spoke to [facility]. Patient still has not been financially cleared.

## 2019-08-21 NOTE — Progress Notes (Signed)
Occupational Therapy Treatment Patient Details Name: Melanie Hall MRN: [id number] DOB: [birth year] Today's Date: 08/21/2019    History of present illness 49 y.o. female with medical history significant of chronic kidney disease stage V, hypertension, hyperlipidemia, diabetes mellitus type 2 who presented to the ER with intermittent chest pain, uncontrolled hypertension. She complains of a gradual onset diffuse headache and R arm weakness. Pt also with hx of monoclonal gammopathy.   OT comments  Pt. Seen for skilled OT treatment session. C/o severe nausea but agreeable to bed level therapies.  Focus on bed mobility and utilization of B UE exercises to increase strength and functional use of extremities as we progress with pt. Participation in ADLs and functional mobility.  Pt. Able to return demo of B UE ROM.  Limited participation in bed mobility with max encouragement due to c/o nausea.    Follow Up Recommendations  Home health OT;Supervision/Assistance - 24 hour    Equipment Recommendations  None recommended by OT    Recommendations for Other Services      Precautions / Restrictions Precautions Precautions: Fall Precaution Comments: gets agitated       Mobility Bed Mobility Overal bed mobility: Needs Assistance             General bed mobility comments: bed mobility in prep for increasing functional mobility as we progress with ADL completion.  pt. declines eob/oob. agreeable to reposition in bed.  max encouragement to push with b les to scoot up in bed. pt. states "i cant"  max/ total assist with use of boost bed function and bed pads to pull pt. up in bed.  encouragement for use of b ues to assist and pt. did not engage  Transfers                 General transfer comment: pt. declined eob/oob today    Balance                                           ADL either performed or assessed with clinical judgement   ADL                                               Vision       Perception     Praxis      Cognition Arousal/Alertness: Lethargic Behavior During Therapy: Flat affect Overall Cognitive Status: Within Functional Limits for tasks assessed                                          Exercises General Exercises - Upper Extremity Shoulder Flexion: PROM;Right;5 reps;Supine Shoulder Extension: PROM;Right;5 reps;Supine Elbow Flexion: PROM;Right;5 reps;Supine Elbow Extension: PROM;Right;5 reps;Supine Wrist Flexion: PROM;Right;5 reps;Supine Wrist Extension: PROM;Right;5 reps;Supine Other Exercises Other Exercises: used RUE for with PROM for demo of HEP/bed level b ue exercises pt. can perform daily to maintain strength and funtion of b ues as we attempt with progression of participation and completion of ADLs.  pt. verbalized understanding and did actively engage so it was started as PROM for demo but pt. did have intemittent AROM during demo   Shoulder Instructions  General Comments      Pertinent Vitals/ Pain       Pain Assessment: Faces Faces Pain Scale: Hurts whole lot Pain Location: stomach/nausea Pain Descriptors / Indicators: Grimacing Pain Intervention(s): Limited activity within patient's tolerance;Monitored during session;Repositioned  Home Living                                          Prior Functioning/Environment              Frequency  Min 2X/week        Progress Toward Goals  OT Goals(current goals can now be found in the care plan section)  Progress towards OT goals: Progressing toward goals     Plan Discharge plan remains appropriate    Co-evaluation                 AM-PAC OT "6 Clicks" Daily Activity     Outcome Measure   Help from another person eating meals?: None Help from another person taking care of personal grooming?: A Little Help from another person toileting, which includes using toliet, bedpan, or  urinal?: A Little Help from another person bathing (including washing, rinsing, drying)?: A Little Help from another person to put on and taking off regular upper body clothing?: A Little Help from another person to put on and taking off regular lower body clothing?: A Little 6 Click Score: 19    End of Session    OT Visit Diagnosis: Unsteadiness on feet (R26.81);Other abnormalities of gait and mobility (R26.89);Muscle weakness (generalized) (M62.81)   Activity Tolerance     Patient Left in bed;with call bell/phone within reach   Nurse Communication          Time: 6579-0383 OT Time Calculation (min): 10 min  Charges: OT General Charges $OT Visit: 1 Visit OT Treatments $Therapeutic Exercise: 8-22 mins  Sonia Baller, COTA/L Acute Rehabilitation [phone]   Janice Coffin 08/21/2019, 8:24 AM

## 2019-08-21 NOTE — Progress Notes (Signed)
Physical Therapy Treatment Patient Details Name: Melanie Hall MRN: [id number] DOB: 04/24/1971 Today's Date: 08/21/2019    History of Present Illness 49 y.o. female with medical history significant of chronic kidney disease stage V, hypertension, hyperlipidemia, diabetes mellitus type 2 who presented to the ER with intermittent chest pain, uncontrolled hypertension. She complains of a gradual onset diffuse headache and R arm weakness. Pt also with hx of monoclonal gammopathy.    PT Comments    Pt with very slow progression towards goals. Only agreeable to bed level exercise despite max encouragement for OOB mobility. Current recommendations appropriate, however, if pt continues to not progress, may need to consider ST SNF at d/c. Will continue to follow acutely to maximize functional mobility independence and safety.     Follow Up Recommendations  Home health PT;Supervision - Intermittent     Equipment Recommendations  None recommended by PT    Recommendations for Other Services       Precautions / Restrictions Precautions Precautions: Fall Precaution Comments: gets agitated Restrictions Weight Bearing Restrictions: No    Mobility  Bed Mobility               General bed mobility comments: Refusing OOB mobility   Transfers                    Ambulation/Gait                 Stairs             Wheelchair Mobility    Modified Rankin (Stroke Patients Only)       Balance                                            Cognition Arousal/Alertness: Awake/alert Behavior During Therapy: Flat affect Overall Cognitive Status: No family/caregiver present to determine baseline cognitive functioning                                        Exercises General Exercises - Lower Extremity Ankle Circles/Pumps: AROM;Both;20 reps;Supine Quad Sets: AROM;Both;10 reps;Supine Heel Slides: AROM;Both;10 reps;Supine Hip  ABduction/ADduction: AAROM;Both;10 reps;Supine    General Comments        Pertinent Vitals/Pain Pain Assessment: Faces Faces Pain Scale: No hurt    Home Living                      Prior Function            PT Goals (current goals can now be found in the care plan section) Acute Rehab PT Goals Patient Stated Goal: to go home PT Goal Formulation: With patient Time For Goal Achievement: 08/23/19 Potential to Achieve Goals: Good Progress towards PT goals: Progressing toward goals(slowly)    Frequency    Min 3X/week      PT Plan Current plan remains appropriate    Co-evaluation              AM-PAC PT "6 Clicks" Mobility   Outcome Measure  Help needed turning from your back to your side while in a flat bed without using bedrails?: None Help needed moving from lying on your back to sitting on the side of a flat bed without using bedrails?: A Little Help needed moving to and  from a bed to a chair (including a wheelchair)?: A Little Help needed standing up from a chair using your arms (e.g., wheelchair or bedside chair)?: A Little Help needed to walk in hospital room?: A Little Help needed climbing 3-5 steps with a railing? : A Lot 6 Click Score: 18    End of Session   Activity Tolerance: Patient limited by fatigue Patient left: in bed;with call bell/phone within reach;with bed alarm set Nurse Communication: Mobility status PT Visit Diagnosis: Unsteadiness on feet (R26.81);Muscle weakness (generalized) (M62.81)     Time: 5051-8335 PT Time Calculation (min) (ACUTE ONLY): 10 min  Charges:  $Therapeutic Exercise: 8-22 mins                     Lou Miner, DPT  Acute Rehabilitation Services  Pager: [phone] Office: [phone]    Rudean Hitt 08/21/2019, 2:14 PM

## 2019-08-21 NOTE — Progress Notes (Signed)
PROGRESS NOTE    Melanie Hall  FIE:[id number] DOB: [birth year] DOA: 08/08/2019 PCP: [facility] [place]     Brief Narrative:  Patient is a 49 year old female with a medical history significant for CKD 5, hyperlipidemia, type 2 diabetes who presented to [facility], ED for strokelike symptoms. CT/MRI negative. She started having worsening renal function. The nephrology team was consulted and she underwent tunneled HD catheterization on 2/2,renal biopsy and HD on 2/3.CT of the abdomen and pelvis on 2/5 showedpossible pericardial effusion but follow-up echo showed mild pericardial effusion without tamponade.2/8 had L AV fistula by vasc surg.  She is uninsured and waiting for discharge once outpatient hemodialysis has been set up.   New events last 24 hours / Subjective: Patient feels very weak and nauseous.  Her appetite is very poor.  She has been having frequent migraines.  She used to take ibuprofen to help break them.  She does have a questionable history of CVA/TIA.  Did not like mag citrate due to sweetness, thought Senokot was the same.  Refuses suppositories/enemas.  Did have a small amount of hard stool pass yesterday.  Assessment & Plan:  Principal Problem: Acute on chronic renal failure [facility]) Appreciate nephrology Monitor renal function panel Avoid nephrotoxins when able Renal diet with fluid restriction Darbepoetin per nephrology             TTS HD  Active Problems:   Migraines  Trial Reglan  Want to avoid triptans given recent possible TIA  Avoid NSAIDs  Could see if Nurtec or Roselyn Meier is on formulary here  I think this is part of her persistent nausea  Tylenol  Deconditioning/weakness PT/OT Home health             Encouraged maximum effort  The longer she is here, the weaker she seems to get  Type 2 diabetes with nephropathy (HCC) Sliding scale  insulin Hold 70/30 given hypoglycemia  HYPERCHOLESTEROLEMIA Continue pravastatin 40 mg daily  GERD (gastroesophageal reflux disease) Protonix 40 mg daily  Accelerated hypertension Coreg 6.25 mg twice daily Cont to hold Norvasc  Hyperkalemia Resolved after HD Monitor renal panel    Constipation             Add Senokot twice daily as needed             Add fleets enema as needed     Urinary retention             Bladder scan  DVT prophylaxis:SCDs Code Status:Full Family Communication:Self Coming From:Home Disposition Plan:Home, requiring home health Barriers to Discharge:Approval for outpatient dialysis  Consultants:  Nephrology  Procedures:  HD tunneled catheter2/2/21  Renal biopsy 08/12/2019 AV fistula2/8/21   Antimicrobials:  Anti-infectives (From admission, onward)   Start     Dose/Rate Route Frequency Ordered Stop   08/17/19 0600  cefUROXime (ZINACEF) 1.5 g in sodium chloride 0.9 % 100 mL IVPB     1.5 g 200 mL/hr over 30 Minutes Intravenous On call to O.R. 08/16/19 0949 08/17/19 0700   08/11/19 1104  ceFAZolin (ANCEF) powder       As needed 08/11/19 1104 08/11/19 1104   08/11/19 1101  ceFAZolin (ANCEF) 2-4 GM/100ML-% IVPB    Note to Pharmacy: Arlean Hopping   : cabinet override      08/11/19 1101 08/11/19 2314        Objective: Vitals:   08/20/19 1810 08/21/19 0013 08/21/19 0602 08/21/19 1123  BP: 132/68 104/60 106/65 100/63  Pulse: 78 72 69  67  Resp: 18 18 18 16   Temp: 97.9 F (36.6 C) 98.3 F (36.8 C) 98.7 F (37.1 C) 99.6 F (37.6 C)  TempSrc: Oral Oral Oral Oral  SpO2: 97% 96% 98% 95%  Weight:   80.9 kg   Height:        Intake/Output Summary (Last 24 hours) at 08/21/2019 1229 Last data filed at 08/21/2019 0330 Gross per 24 hour  Intake 240 ml  Output --  Net 240 ml   Filed Weights   08/20/19 0713 08/20/19 1119 08/21/19  0602  Weight: 83.4 kg 80.2 kg 80.9 kg    Examination:  General exam: Appears calm and comfortable  Respiratory system: Clear to auscultation. Respiratory effort normal. No respiratory distress. No conversational dyspnea.  Cardiovascular system: S1 & S2 heard, RRR. No murmurs. No pedal edema. Gastrointestinal system: Abdomen is nondistended, soft and nontender. Normal bowel sounds heard. Central nervous system: Alert and oriented. No focal neurological deficits. Speech clear. More talkative today Extremities: Symmetric in appearance  Skin: No rashes, lesions or ulcers on exposed skin  Psychiatry: Judgement and insight appear normal. Flat affec  Data Reviewed: I have personally reviewed following labs and imaging studies  CBC: Recent Labs  Lab 08/16/19 0224 08/16/19 0224 08/17/19 0720 08/18/19 0543 08/19/19 0514 08/20/19 0131 08/21/19 0555  WBC 8.0  --   --  11.1* 11.6* 12.8* 11.8*  HGB 7.9*   < > 8.5* 8.7* 7.9* 7.5* 7.8*  HCT 25.0*   < > 25.0* 27.6* 25.8* 24.9* 25.2*  MCV 93.3  --   --  95.8 98.1 98.4 96.9  PLT 168  --   --  179 158 133* 126*   < > = values in this interval not displayed.   Basic Metabolic Panel: Recent Labs  Lab 08/17/19 1028 08/18/19 0543 08/19/19 0514 08/20/19 0131 08/21/19 0555  NA 133* 134* 136 133* 136  K 5.5* 5.7* 4.4 4.4 3.9  CL 99 98 99 95* 96*  CO2 20* 20* 23 23 26   GLUCOSE 222* 58* 105* 165* 120*  BUN 53* 63* 32* 40* 25*  CREATININE 5.82* 6.55* 4.29* 5.48* 3.98*  CALCIUM 8.1* 8.5* 8.2* 8.1* 8.0*  PHOS 7.0* 7.6* 5.0* 5.3* 3.7   GFR: Estimated Creatinine Clearance: 17.2 mL/min (A) (by C-G formula based on SCr of 3.98 mg/dL (H)). Liver Function Tests: Recent Labs  Lab 08/17/19 1028 08/18/19 0543 08/19/19 0514 08/20/19 0131 08/21/19 0555  ALBUMIN 2.5* 2.5* 2.5* 2.4* 2.2*   CBG: Recent Labs  Lab 08/20/19 1243 08/20/19 1620 08/20/19 2140 08/21/19 0613 08/21/19 1104  GLUCAP 101* 124* 148* 107* 117*   Recent Results (from the  past 240 hour(s))  Surgical pcr screen     Status: None   Collection Time: 08/16/19  9:18 PM   Specimen: Nasal Mucosa; Nasal Swab  Result Value Ref Range Status   MRSA, PCR NEGATIVE NEGATIVE Final   Staphylococcus aureus NEGATIVE NEGATIVE Final    Comment: (NOTE) The Xpert SA Assay (FDA approved for NASAL specimens in patients 57 years of age and older), is one component of a comprehensive surveillance program. It is not intended to diagnose infection nor to guide or monitor treatment. Performed at [facility], [place] [address], [place], [place] [address]      Scheduled Meds: . carvedilol  6.25 mg Oral BID WC  . Chlorhexidine Gluconate Cloth  6 each Topical Q0600  . Chlorhexidine Gluconate Cloth  6 each Topical Q0600  . darbepoetin (ARANESP) injection - DIALYSIS  100 mcg Intravenous Q Tue-HD  . feeding supplement (NEPRO CARB STEADY)  237 mL Oral Q24H  . ferric citrate  420 mg Oral TID WC  . gabapentin  100 mg Oral BID  . insulin aspart  0-9 Units Subcutaneous TID WC  . levothyroxine  25 mcg Oral Daily  . midodrine  10 mg Oral Once  . multivitamin  1 tablet Oral QHS  . pantoprazole  40 mg Oral QHS  . pravastatin  40 mg Oral q1800   Continuous Infusions: . sodium chloride    . sodium chloride    . ferric gluconate (FERRLECIT/NULECIT) IV Stopped (08/21/19 1105)     LOS: 13 days    Time spent: 20 minutes   Shelda Pal, DO [facility] 08/21/2019, 12:29 PM   Available via Epic secure chat 7am-7pm After these hours, please refer to coverage provider listed on [email or web address]

## 2019-08-21 NOTE — Progress Notes (Signed)
Initial Nutrition Assessment  DOCUMENTATION CODES:   Obesity unspecified  INTERVENTION:   -Continue Nepro Shake po daily, each supplement provides 425 kcal and 19 grams protein -30 ml Prostat TID, each supplement provides 100 kcals and 15 grams protein -Magic cup TID with meals, each supplement provides 290 kcal and 9 grams of protein -Continue renal MVI daily  NUTRITION DIAGNOSIS:   Inadequate oral intake related to nausea, poor appetite as evidenced by per patient/family report, meal completion < 50%.  GOAL:   Patient will meet greater than or equal to 90% of their needs  MONITOR:   PO intake, Supplement acceptance, Labs, Weight trends, Skin, I & O's  REASON FOR ASSESSMENT:   Consult Poor PO  ASSESSMENT:   Patient is a 49 year old female with a medical history significant for CKD 5, hyperlipidemia, type 2 diabetes who presented to [facility], ED for strokelike symptoms.  CT/MRI negative.  She started having worsening renal function.  The nephrology team was consulted and she underwent tunneled HD catheterization on 2/2, renal biopsy and HD on 2/3.  CT of the abdomen and pelvis on 2/5 showed possible pericardial effusion but follow-up echo showed mild pericardial effusion without tamponade. 2/8 had L AV fistula by vasc surg.  She is uninsured and waiting for discharge once outpatient hemodialysis has been set up.  Pt admitted with acute on chronic renal failure with baseline CKD stage V.   2/2- s/p tunneled HD cath placement 2/3- s/p US guided biopsy of inferior pole of the left kidney, HD initiated 2/5- CT of abdomen and pelvis reveals mild pericardial effusion 2/8- s/p Left brachiocephalic AV fistula  Reviewed I/O's: -1.8 L x 24 hours and -299 ml since admission  Spoke with pt at bedside, who was not very conversant with this RD. Pt would fall back asleep multiple times during interview. She would nod her head to answer close ended questions. Spoke with nurse tech, who  reports he has had similar interactions this shift.   Pt confirms poor appetite secondary to nausea. Noted meal completion 0-100% (averaging 0-25%). Pt refused this morning's dose of Nepro. Breakfast tray in front of pt, unattempted.   Reviewed wt hx; not wt gain trend over the past 6 months, likely related to fluid status.   Medications reviewed and include reglan.  Pt awaiting outpatient HD placement.   Lab Results  Component Value Date   HGBA1C 8.1 (H) 05/27/2019   PTA DM medications are 15 units insulin aspart protamine-aspart TID before meals.   Labs reviewed: CBGS: 101-148 (inpatient orders for glycemic control are 0-9 units insulin aspart TID with meals).   NUTRITION - FOCUSED PHYSICAL EXAM:    Most Recent Value  Orbital Region  No depletion  Upper Arm Region  No depletion  Thoracic and Lumbar Region  No depletion  Buccal Region  No depletion  Temple Region  Mild depletion  Clavicle Bone Region  No depletion  Clavicle and Acromion Bone Region  No depletion  Scapular Bone Region  No depletion  Dorsal Hand  No depletion  Patellar Region  No depletion  Anterior Thigh Region  No depletion  Posterior Calf Region  No depletion  Edema (RD Assessment)  Mild  Hair  Reviewed  Eyes  Reviewed  Mouth  Reviewed  Skin  Reviewed  Nails  Reviewed       Diet Order:   Diet Order            Diet renal/carb modified with fluid restriction Fluid restriction:  1200 mL Fluid; Room service appropriate? Yes; Fluid consistency: Thin  Diet effective now              EDUCATION NEEDS:   No education needs have been identified at this time  Skin:  Skin Assessment: Skin Integrity Issues: Skin Integrity Issues:: Incisions Incisions: lt flank, lt arm  Last BM:  08/15/19  Height:   Ht Readings from Last 1 Encounters:  08/08/19 5\' 3"  (1.6 m)    Weight:   Wt Readings from Last 1 Encounters:  08/21/19 80.9 kg    Ideal Body Weight:  52.3 kg  BMI:  Body mass index is 31.59  kg/m.  Estimated Nutritional Needs:   Kcal:  1650-1850  Protein:  80-95 grams  Fluid:  1000 ml + UOP    Loistine Chance, RD, LDN, CDCES Registered Dietitian II Certified Diabetes Care and Education Specialist Please refer to [facility] for RD and/or RD on-call/weekend/after hours pager

## 2019-08-21 NOTE — Progress Notes (Signed)
Patient ID: Melanie Hall, female   DOB: 07-09-1971, 49 y.o.   MRN: [id number]  [facility] Progress Note   Assessment/ Plan:   1.  Volume overload/nephrotic syndrome: Secondary to progressive chronic kidney disease from renal biopsy-proven diabetic nephrosclerosis, improving with ultrafiltration at hemodialysis. 2. ESRD: New start on hemodialysis with renal biopsy showing diabetic kidney disease with advanced tubular atrophy/interstitial sclerosis.  Continue hemodialysis on a TTS schedule at this time as effort is underway for outpatient dialysis unit placement pending financial clearance.  She is status post left brachiocephalic fistula on 09/14/7562 and with a functioning right IJ TDC catheter. 3. Anemia: Status post PRBC transfusion earlier, on intravenous iron/ESA for iron deficiency. 4. CKD-MBD: Mild hyperphosphatemia noted, continue to follow with hemodialysis/Auryxia. 5. Nutrition: Continue renal diet with fluid restriction, begin renal multivitamin/Nepro. 6. Hypertension: Blood pressure maintained within acceptable range on midodrine, will continue to monitor on dialysis.  Subjective:   Reports that she has had a rough morning with nausea and could not eat any of her breakfast.  She denies any abdominal pain.   Objective:   BP 106/65 (BP Location: Right Arm)   Pulse 69   Temp 98.7 F (37.1 C) (Oral)   Resp 18   Ht 5\' 3"  (1.6 m)   Wt 80.9 kg   LMP 12/17/2016   SpO2 98%   BMI 31.59 kg/m   Physical Exam: Gen: Sleeping comfortably in bed, untouched breakfast tray at bedside CVS: Pulse regular rhythm, normal rate, S1 and S2 normal.  Right IJ TDC Resp: Clear to auscultation with decreased breath sounds over bases, no rales/rhonchi Abd: Soft, flat, nontender Ext: Trace-1+ lower extremity edema, left BCF with audible bruit  Labs: BMET Recent Labs  Lab 08/15/19 0736 08/15/19 0736 08/16/19 0224 08/17/19 0720 08/17/19 1028 08/18/19 0543 08/19/19 0514  08/20/19 0131 08/21/19 0555  NA 130*   < > 134* 131* 133* 134* 136 133* 136  K 3.8   < > 4.5 4.9 5.5* 5.7* 4.4 4.4 3.9  CL 95*   < > 97* 96* 99 98 99 95* 96*  CO2 22  --  24  --  20* 20* 23 23 26   GLUCOSE 240*   < > 179* 204* 222* 58* 105* 165* 120*  BUN 67*   < > 35* 51* 53* 63* 32* 40* 25*  CREATININE 5.97*   < > 4.17* 5.90* 5.82* 6.55* 4.29* 5.48* 3.98*  CALCIUM 7.6*  --  8.2*  --  8.1* 8.5* 8.2* 8.1* 8.0*  PHOS 6.6*  --   --   --  7.0* 7.6* 5.0* 5.3* 3.7   < > = values in this interval not displayed.   CBC Recent Labs  Lab 08/18/19 0543 08/19/19 0514 08/20/19 0131 08/21/19 0555  WBC 11.1* 11.6* 12.8* 11.8*  HGB 8.7* 7.9* 7.5* 7.8*  HCT 27.6* 25.8* 24.9* 25.2*  MCV 95.8 98.1 98.4 96.9  PLT 179 158 133* 126*     Medications:    . carvedilol  6.25 mg Oral BID WC  . Chlorhexidine Gluconate Cloth  6 each Topical Q0600  . Chlorhexidine Gluconate Cloth  6 each Topical Q0600  . darbepoetin (ARANESP) injection - DIALYSIS  100 mcg Intravenous Q Tue-HD  . feeding supplement (NEPRO CARB STEADY)  237 mL Oral Q24H  . ferric citrate  420 mg Oral TID WC  . gabapentin  100 mg Oral BID  . insulin aspart  0-9 Units Subcutaneous TID WC  . levothyroxine  25 mcg Oral  Daily  . midodrine  10 mg Oral Once  . multivitamin  1 tablet Oral QHS  . pantoprazole  40 mg Oral QHS  . pravastatin  40 mg Oral q1800   Elmarie Shiley, MD 08/21/2019, 11:18 AM

## 2019-08-22 DIAGNOSIS — I10 Essential (primary) hypertension: Secondary | ICD-10-CM

## 2019-08-22 DIAGNOSIS — E1121 Type 2 diabetes mellitus with diabetic nephropathy: Secondary | ICD-10-CM

## 2019-08-22 LAB — RENAL FUNCTION PANEL
Albumin: 2.2 g/dL — ABNORMAL LOW (ref 3.5–5.0)
Anion gap: 12 (ref 5–15)
BUN: 40 mg/dL — ABNORMAL HIGH (ref 6–20)
CO2: 25 mmol/L (ref 22–32)
Calcium: 8.2 mg/dL — ABNORMAL LOW (ref 8.9–10.3)
Chloride: 96 mmol/L — ABNORMAL LOW (ref 98–111)
Creatinine, Ser: 5.23 mg/dL — ABNORMAL HIGH (ref 0.44–1.00)
GFR calc Af Amer: 10 mL/min — ABNORMAL LOW (ref >60)
GFR calc non Af Amer: 9 mL/min — ABNORMAL LOW (ref >60)
Glucose, Bld: 136 mg/dL — ABNORMAL HIGH (ref 70–99)
Phosphorus: 4.4 mg/dL (ref 2.5–4.6)
Potassium: 4.4 mmol/L (ref 3.5–5.1)
Sodium: 133 mmol/L — ABNORMAL LOW (ref 135–145)

## 2019-08-22 LAB — CBC
HCT: 27.4 % — ABNORMAL LOW (ref 36.0–46.0)
Hemoglobin: 8.3 g/dL — ABNORMAL LOW (ref 12.0–15.0)
MCH: 30.9 pg (ref 26.0–34.0)
MCHC: 30.3 g/dL (ref 30.0–36.0)
MCV: 101.9 fL — ABNORMAL HIGH (ref 80.0–100.0)
Platelets: 131 10*3/uL — ABNORMAL LOW (ref 150–400)
RBC: 2.69 MIL/uL — ABNORMAL LOW (ref 3.87–5.11)
RDW: 17.3 % — ABNORMAL HIGH (ref 11.5–15.5)
WBC: 10.9 10*3/uL — ABNORMAL HIGH (ref 4.0–10.5)
nRBC: 0 % (ref 0.0–0.2)

## 2019-08-22 LAB — GLUCOSE, CAPILLARY
Glucose-Capillary: 132 mg/dL — ABNORMAL HIGH (ref 70–99)
Glucose-Capillary: 174 mg/dL — ABNORMAL HIGH (ref 70–99)
Glucose-Capillary: 170 mg/dL — ABNORMAL HIGH (ref 70–99)
Glucose-Capillary: 189 mg/dL — ABNORMAL HIGH (ref 70–99)

## 2019-08-22 MED ORDER — HEPARIN SODIUM (PORCINE) 1000 UNIT/ML IJ SOLN
3200.0000 [IU] | INTRAMUSCULAR | Status: DC | PRN
  Administered 2019-08-22: 3200 [IU]
  Filled 2019-08-22: qty 3.2

## 2019-08-22 MED ORDER — POLYETHYLENE GLYCOL 3350 17 G PO PACK
17.0000 g | PACK | Freq: Every day | ORAL | Status: DC
  Filled 2019-08-22 (×2): qty 1

## 2019-08-22 NOTE — Procedures (Signed)
Patient seen on Hemodialysis- complains of feeling tired and with some nausea. BP (!) 91/55   Pulse 63   Temp 98.1 F (36.7 C) (Oral)   Resp 18   Ht 5\' 3"  (1.6 m)   Wt 81.1 kg   LMP 12/17/2016   SpO2 98%   BMI 31.67 kg/m   QB 400, UF goal 2.5L Tolerating treatment without problems at this time.  Do not give Fleet's enema (phosphorus load), would try miralax (ordered) or lactulose for constipation.   Elmarie Shiley MD [facility]. Office # [phone] Pager # [phone] 9:30 AM

## 2019-08-22 NOTE — Plan of Care (Signed)
  Problem: Education: Goal: Knowledge of disease or condition will improve Outcome: Progressing   Problem: Nutrition: Goal: Dietary intake will improve Outcome: Progressing

## 2019-08-22 NOTE — Progress Notes (Addendum)
PROGRESS NOTE    Melanie Hall  EXN:[id number] DOB: [birth year] DOA: 08/08/2019  PCP: [facility]    Brief Narrative:  49 year old female with a medical history significant for CKD 5 which has progressed to end-stage renal disease needing hemodialysis, hyperlipidemia, type 2 diabetes who presented to [facility], ED for strokelike symptoms. CT/MRI negative. She started having worsening renal function. The nephrology team was consulted and she underwent tunneled HD catheterization on 2/2,renal biopsy and HD on 2/3.CT of the abdomen and pelvis on 2/5 showedpossible pericardial effusion but follow-up echo showed mild pericardial effusion without tamponade.2/8 had L AV fistula by vasc surg.She is uninsured and waiting for discharge once outpatient hemodialysis is arranged.  Assessment & Plan:   Principal Problem:   Acute on chronic renal failure (HCC) Active Problems:   Type 2 diabetes with nephropathy (HCC)   HYPERCHOLESTEROLEMIA   GERD (gastroesophageal reflux disease)   MGUS (monoclonal gammopathy of unknown significance)   Accelerated hypertension   Right arm weakness   Anemia in chronic kidney disease  Clinical problems list 1.  Transient ischemic attack/strokelike symptoms 2.  End-stage renal disease on hemodialysis 3.  Diabetes mellitus type 2 4.  Hyperlipidemia 5.  Gastroesophageal reflux disease 6.  Constipation 7.  Debility 8.  History of migraine 9.  Essential hypertension  1.  Transient ischemic attack/strokelike symptoms.  Patient was transferred from [facility] with strokelike symptoms.  CT and MRI of the brain was negative for any acute intracranial process.  Patient subsequently developed worsening renal function likely from contrast-induced nephropathy.  She has no neurological deficit. Continue with aspirin and statin  2.  End-stage renal disease on hemodialysis (Tuesdays, Thursdays and Saturdays).  Patient is status post hemodialysis  today. Nephrology is following.  Awaiting outpatient hemodialysis chair prior to discharge.  3.  Diabetes mellitus type 2 Continue with sliding scale insulin Fingersticks before meals and at bedtime Hypoglycemic protocol  4.  Hyperlipidemia Continue with pravastatin 40 mg daily  5.  Gastroesophageal reflux disease Continue with Protonix daily  7.  Debility PT and OT on board.  Patient may need home physical therapy at discharge  8.  History of migraine.  Appears to be without symptoms though has been complaining of nausea. We will consider initiating Topamax for long-term management of migraine  9.  Essential hypertension Continue with Coreg       DVT prophylaxis: Heparin subcute Code Status: Full code  Family Communication: None at bedside  Disposition Plan: Patient is from home and plan is to discharge home Barriers to discharge include outpatient hemodialysis arrangement  Consultants:   Nephrology  Procedures:  08/11/2019: Tunneled catheter for hemodialysis 08/12/2019: Renal biopsy 08/17/2019: Left brachiocephalic AV fistula  Antimicrobials:  None   Subjective: Patient was seen and examined at bedside.  Complain of generalized weakness.  Status post hemodialysis.  Tolerated procedure well. Denied any chest pain, palpitation or diaphoresis. No acute overnight event was reported  Objective: Vitals:   08/22/19 1030 08/22/19 1103 08/22/19 1115 08/22/19 1146  BP: 102/65 100/61 104/66 113/70  Pulse: 64 62 63 64  Resp:   12 15  Temp:   (!) 97.5 F (36.4 C) 97.7 F (36.5 C)  TempSrc:   Oral Oral  SpO2:   100% 98%  Weight:   78.3 kg   Height:        Intake/Output Summary (Last 24 hours) at 08/22/2019 1451 Last data filed at 08/22/2019 1337 Gross per 24 hour  Intake 240 ml  Output 2000 ml  Net -1760 ml   Filed Weights   08/22/19 0609 08/22/19 0715 08/22/19 1115  Weight: 81.1 kg 81.5 kg 78.3 kg    Examination:  General exam: Appears calm and  comfortable.  Not in acute distress Respiratory system: Clear to auscultation. Respiratory effort normal. Cardiovascular system: S1 & S2 heard, RRR. No JVD, murmurs, rubs, gallops or clicks. No pedal edema. Gastrointestinal system: Abdomen is nondistended, soft and nontender. No organomegaly or masses felt. Normal bowel sounds heard. Central nervous system: Alert and oriented.  No focal neurological deficits. Extremities: B/l lower extremities. Skin: Left AV fistula.  No erythema  Psychiatry: Judgement and insight appear normal. Mood & affect appropriate.     Data Reviewed: I have personally reviewed following labs and imaging studies  CBC: Recent Labs  Lab 08/18/19 0543 08/19/19 0514 08/20/19 0131 08/21/19 0555 08/22/19 0639  WBC 11.1* 11.6* 12.8* 11.8* 10.9*  HGB 8.7* 7.9* 7.5* 7.8* 8.3*  HCT 27.6* 25.8* 24.9* 25.2* 27.4*  MCV 95.8 98.1 98.4 96.9 101.9*  PLT 179 158 133* 126* 235*   Basic Metabolic Panel: Recent Labs  Lab 08/18/19 0543 08/19/19 0514 08/20/19 0131 08/21/19 0555 08/22/19 0639  NA 134* 136 133* 136 133*  K 5.7* 4.4 4.4 3.9 4.4  CL 98 99 95* 96* 96*  CO2 20* 23 23 26 25   GLUCOSE 58* 105* 165* 120* 136*  BUN 63* 32* 40* 25* 40*  CREATININE 6.55* 4.29* 5.48* 3.98* 5.23*  CALCIUM 8.5* 8.2* 8.1* 8.0* 8.2*  PHOS 7.6* 5.0* 5.3* 3.7 4.4   GFR: Estimated Creatinine Clearance: 12.9 mL/min (A) (by C-G formula based on SCr of 5.23 mg/dL (H)). Liver Function Tests: Recent Labs  Lab 08/18/19 0543 08/19/19 0514 08/20/19 0131 08/21/19 0555 08/22/19 0639  ALBUMIN 2.5* 2.5* 2.4* 2.2* 2.2*   No results for input(s): LIPASE, AMYLASE in the last 168 hours. No results for input(s): AMMONIA in the last 168 hours. Coagulation Profile: No results for input(s): INR, PROTIME in the last 168 hours. Cardiac Enzymes: No results for input(s): CKTOTAL, CKMB, CKMBINDEX, TROPONINI in the last 168 hours. BNP (last 3 results) No results for input(s): PROBNP in the last  8760 hours. HbA1C: No results for input(s): HGBA1C in the last 72 hours. CBG: Recent Labs  Lab 08/21/19 0613 08/21/19 1104 08/21/19 1602 08/21/19 2109 08/22/19 0604  GLUCAP 107* 117* 96 114* 132*   Lipid Profile: No results for input(s): CHOL, HDL, LDLCALC, TRIG, CHOLHDL, LDLDIRECT in the last 72 hours. Thyroid Function Tests: No results for input(s): TSH, T4TOTAL, FREET4, T3FREE, THYROIDAB in the last 72 hours. Anemia Panel: No results for input(s): VITAMINB12, FOLATE, FERRITIN, TIBC, IRON, RETICCTPCT in the last 72 hours. Sepsis Labs: No results for input(s): PROCALCITON, LATICACIDVEN in the last 168 hours.  Recent Results (from the past 240 hour(s))  Surgical pcr screen     Status: None   Collection Time: 08/16/19  9:18 PM   Specimen: Nasal Mucosa; Nasal Swab  Result Value Ref Range Status   MRSA, PCR NEGATIVE NEGATIVE Final   Staphylococcus aureus NEGATIVE NEGATIVE Final    Comment: (NOTE) The Xpert SA Assay (FDA approved for NASAL specimens in patients 63 years of age and older), is one component of a comprehensive surveillance program. It is not intended to diagnose infection nor to guide or monitor treatment. Performed at [facility], [place] [address], [place],  [address]          Radiology Studies: No results found.  Scheduled Meds: . carvedilol  6.25 mg Oral BID WC  . Chlorhexidine Gluconate Cloth  6 each Topical Q0600  . Chlorhexidine Gluconate Cloth  6 each Topical Q0600  . darbepoetin (ARANESP) injection - DIALYSIS  100 mcg Intravenous Q Tue-HD  . feeding supplement (NEPRO CARB STEADY)  237 mL Oral Q24H  . feeding supplement (PRO-STAT SUGAR FREE 64)  30 mL Oral TID BM  . ferric citrate  420 mg Oral TID WC  . gabapentin  100 mg Oral BID  . insulin aspart  0-9 Units Subcutaneous TID WC  . levothyroxine  25 mcg Oral Daily  . midodrine  10 mg Oral Once  . multivitamin  1 tablet Oral QHS  . pantoprazole  40 mg Oral QHS  .  polyethylene glycol  17 g Oral Daily  . pravastatin  40 mg Oral q1800   Continuous Infusions: . sodium chloride    . sodium chloride    . ferric gluconate (FERRLECIT/NULECIT) IV Stopped (08/21/19 1105)     LOS: 14 days    Time spent: 35 minutes   Elie Confer, MD [facility] Pager [phone]   If 7PM-7AM, please contact night-coverage [email or web address] Password [facility] 08/22/2019, 2:51 PM

## 2019-08-23 ENCOUNTER — Inpatient Hospital Stay (HOSPITAL_COMMUNITY): Payer: Medicaid Other

## 2019-08-23 LAB — PHOSPHORUS: Phosphorus: 3.4 mg/dL (ref 2.5–4.6)

## 2019-08-23 LAB — COMPREHENSIVE METABOLIC PANEL
ALT: 262 U/L — ABNORMAL HIGH (ref 0–44)
AST: 158 U/L — ABNORMAL HIGH (ref 15–41)
Albumin: 2.1 g/dL — ABNORMAL LOW (ref 3.5–5.0)
Alkaline Phosphatase: 245 U/L — ABNORMAL HIGH (ref 38–126)
Anion gap: 12 (ref 5–15)
BUN: 25 mg/dL — ABNORMAL HIGH (ref 6–20)
CO2: 25 mmol/L (ref 22–32)
Calcium: 7.6 mg/dL — ABNORMAL LOW (ref 8.9–10.3)
Chloride: 94 mmol/L — ABNORMAL LOW (ref 98–111)
Creatinine, Ser: 3.6 mg/dL — ABNORMAL HIGH (ref 0.44–1.00)
GFR calc Af Amer: 16 mL/min — ABNORMAL LOW (ref >60)
GFR calc non Af Amer: 14 mL/min — ABNORMAL LOW (ref >60)
Glucose, Bld: 166 mg/dL — ABNORMAL HIGH (ref 70–99)
Potassium: 4 mmol/L (ref 3.5–5.1)
Sodium: 131 mmol/L — ABNORMAL LOW (ref 135–145)
Total Bilirubin: 1.3 mg/dL — ABNORMAL HIGH (ref 0.3–1.2)
Total Protein: 5.8 g/dL — ABNORMAL LOW (ref 6.5–8.1)

## 2019-08-23 LAB — CBC
HCT: 26.5 % — ABNORMAL LOW (ref 36.0–46.0)
Hemoglobin: 7.9 g/dL — ABNORMAL LOW (ref 12.0–15.0)
MCH: 29.9 pg (ref 26.0–34.0)
MCHC: 29.8 g/dL — ABNORMAL LOW (ref 30.0–36.0)
MCV: 100.4 fL — ABNORMAL HIGH (ref 80.0–100.0)
Platelets: 134 10*3/uL — ABNORMAL LOW (ref 150–400)
RBC: 2.64 MIL/uL — ABNORMAL LOW (ref 3.87–5.11)
RDW: 17.2 % — ABNORMAL HIGH (ref 11.5–15.5)
WBC: 10.1 10*3/uL (ref 4.0–10.5)
nRBC: 0 % (ref 0.0–0.2)

## 2019-08-23 LAB — MAGNESIUM: Magnesium: 1.7 mg/dL (ref 1.7–2.4)

## 2019-08-23 LAB — GLUCOSE, CAPILLARY
Glucose-Capillary: 155 mg/dL — ABNORMAL HIGH (ref 70–99)
Glucose-Capillary: 200 mg/dL — ABNORMAL HIGH (ref 70–99)
Glucose-Capillary: 119 mg/dL — ABNORMAL HIGH (ref 70–99)
Glucose-Capillary: 212 mg/dL — ABNORMAL HIGH (ref 70–99)

## 2019-08-23 LAB — HEPATITIS PANEL, ACUTE
HCV Ab: NONREACTIVE
Hep A IgM: NONREACTIVE
Hep B C IgM: NONREACTIVE
Hepatitis B Surface Ag: NONREACTIVE

## 2019-08-23 IMAGING — CT CT L SPINE W/O CM
3 of 5 series · 12 of 33 positions shown, 14 images · non-contrast
Comparison: [DATE] lumbar spine CT. [DATE] CT abdomen and
pelvis.

CLINICAL DATA: Fecal incontinence. Bilateral lower extremity
weakness.

EXAM:
CT LUMBAR SPINE WITHOUT CONTRAST
TECHNIQUE: Multidetector CT imaging of the lumbar spine was performed without
intravenous contrast administration. Multiplanar CT image
reconstructions were also generated.

[Series 5: l spine soft · axial · 0.32mm/px · z∈[-245,-65]mm · 6 of 118 slices shown, 8 images]
[im 19/118  soft-tissue]
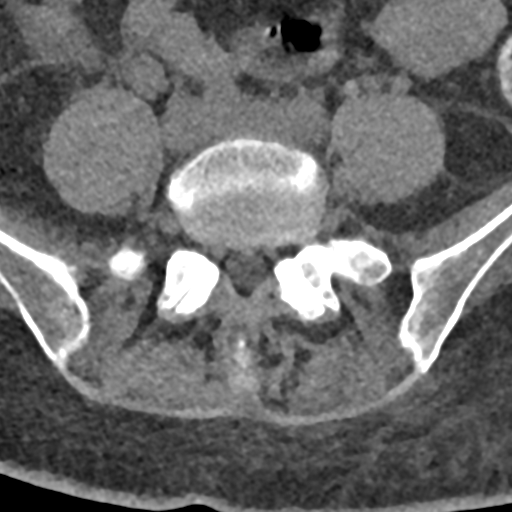
[im 19/118  bone]
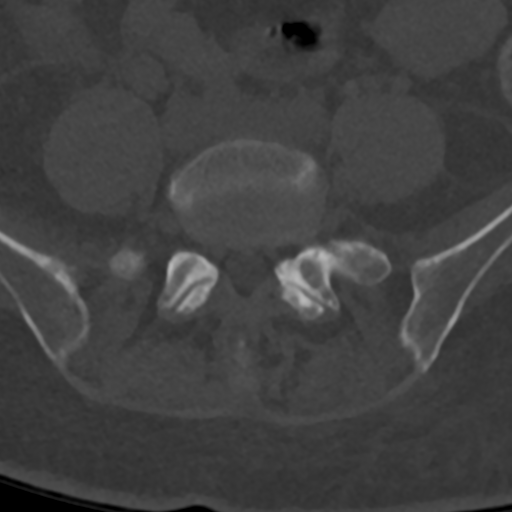
[im 37/118  bone]
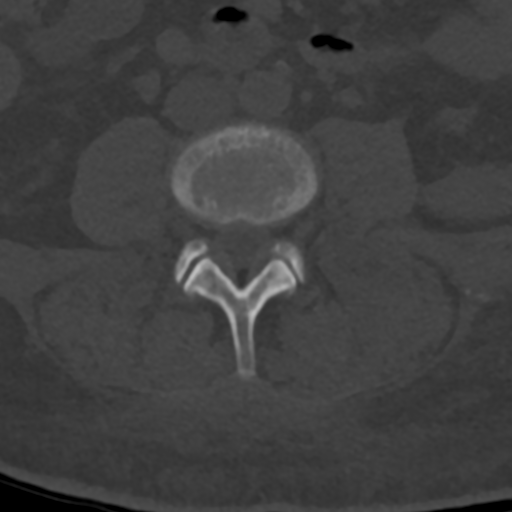
[im 55/118  bone]
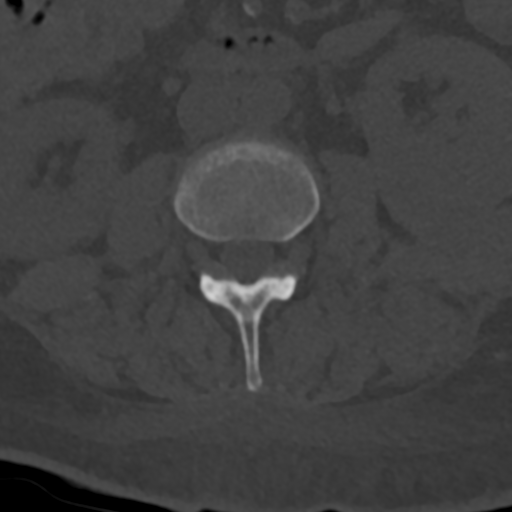
[im 73/118  bone]
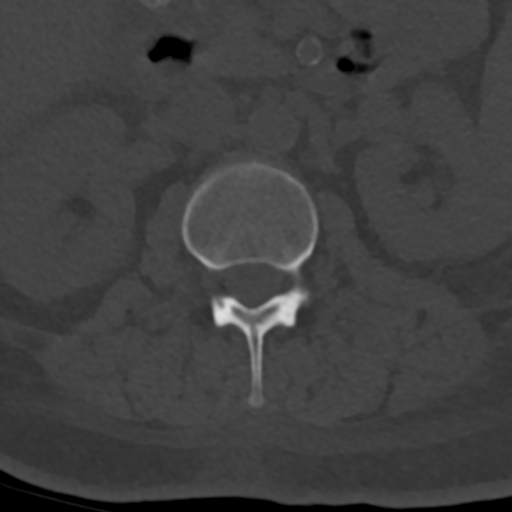
[im 91/118  soft-tissue]
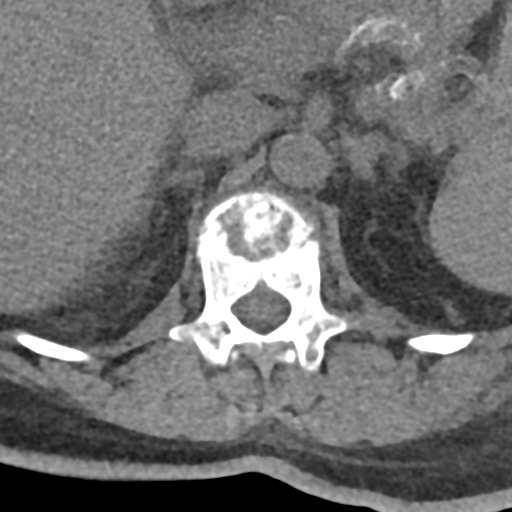
[im 91/118  bone]
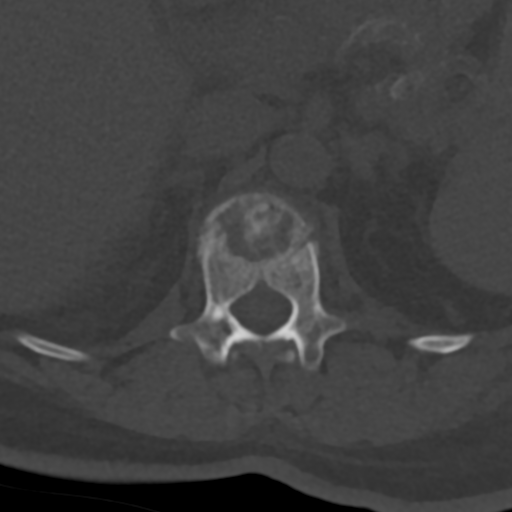
[im 109/118  bone]
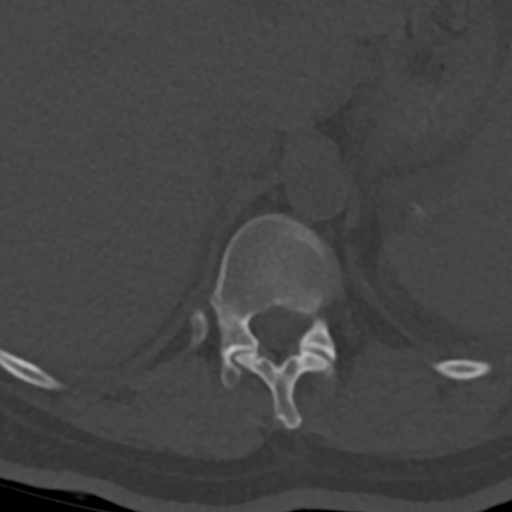

[Series 6: sag bone · sagittal · 0.26mm/px · 5 of 85 slices shown]
[im 15/85  bone]
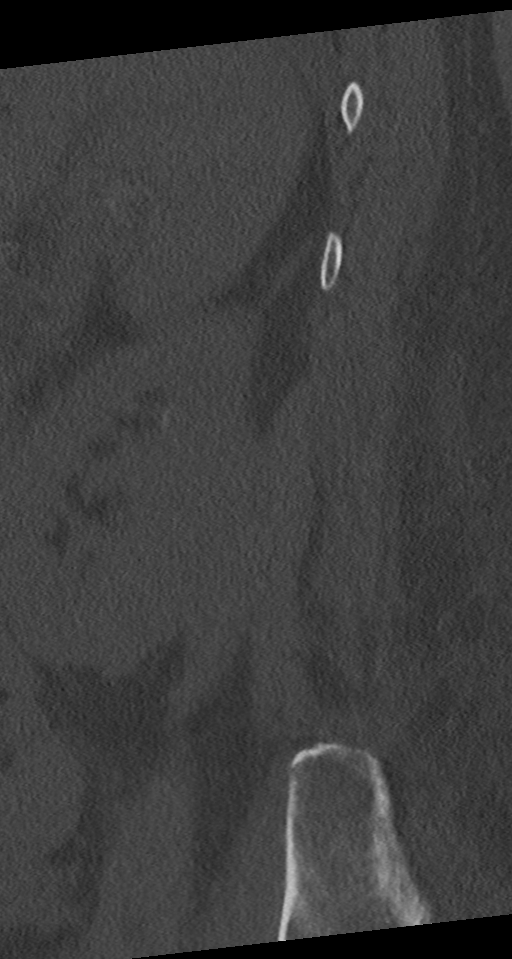
[im 29/85  bone]
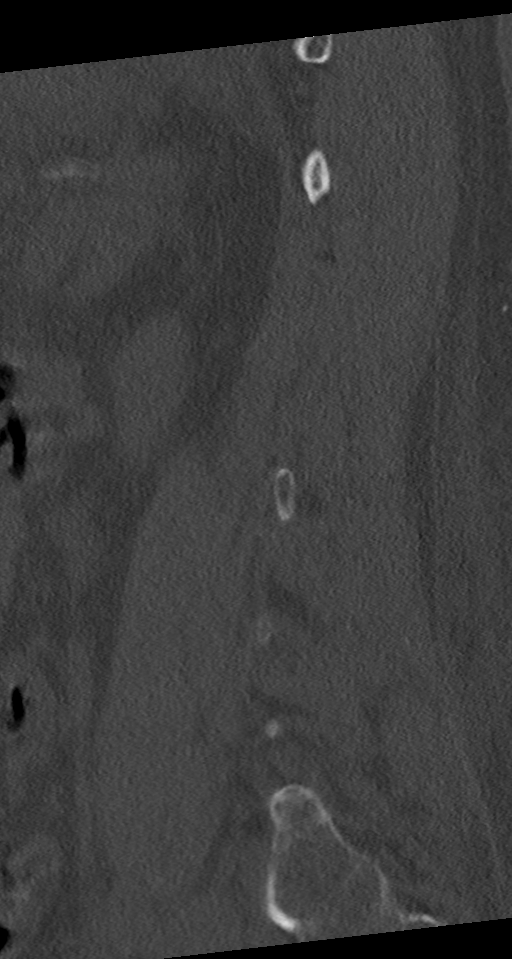
[im 43/85  bone]
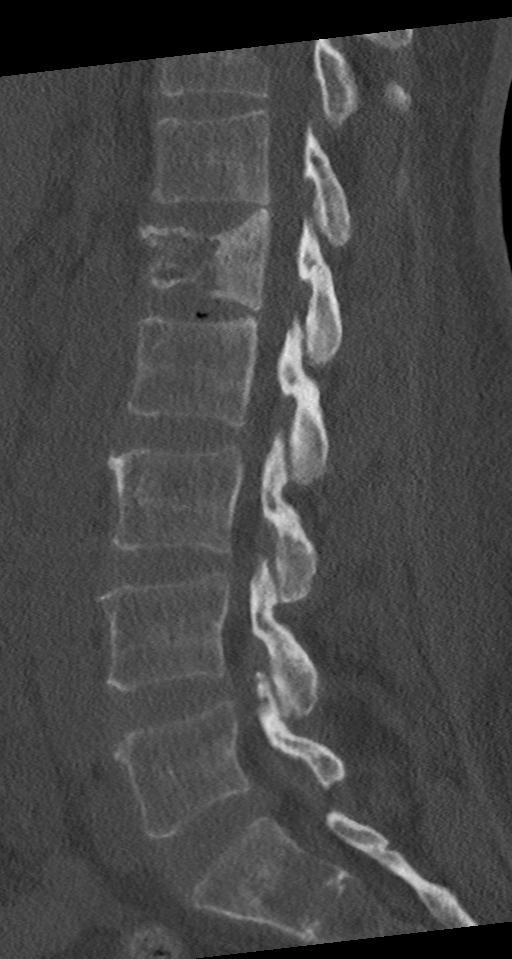
[im 57/85  bone]
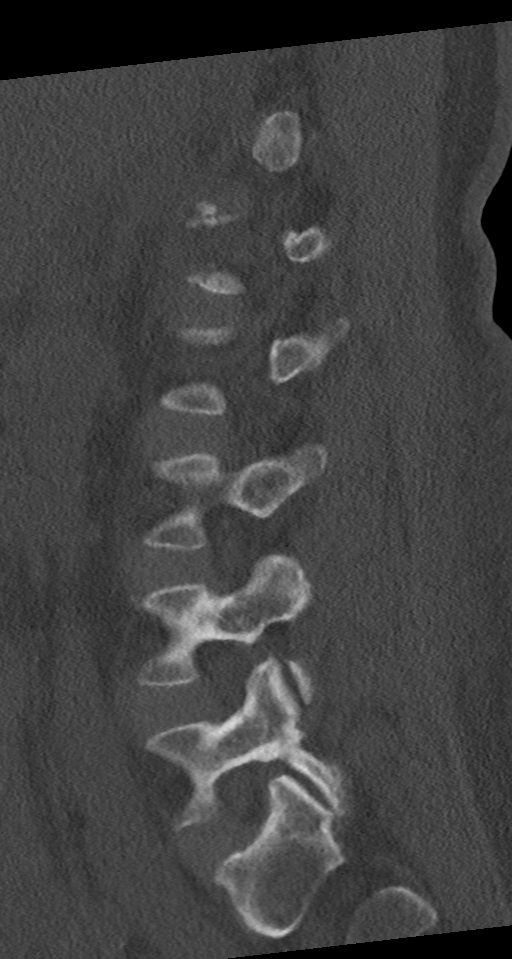
[im 71/85  bone]
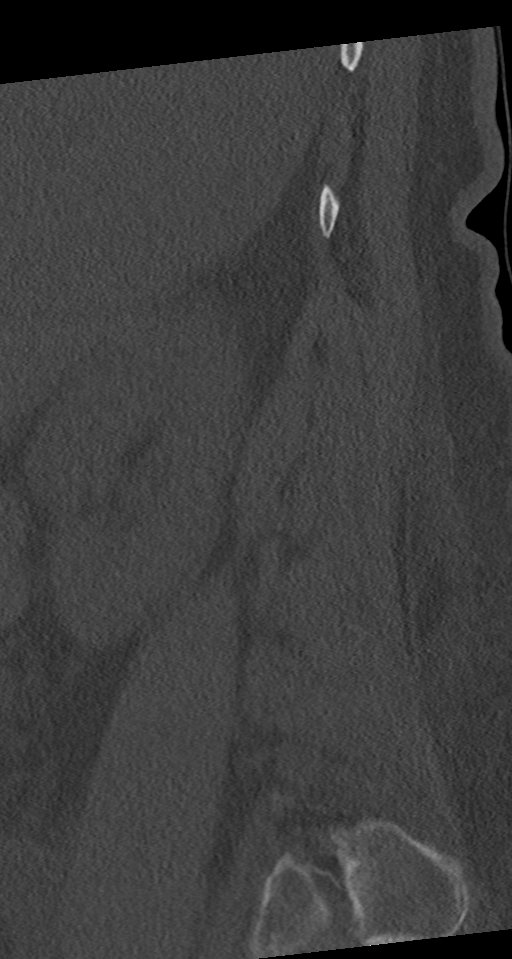

[Series 8: cor st · coronal · 0.31mm/px · 1 of 75 slices shown]
[im 38/75  bone]
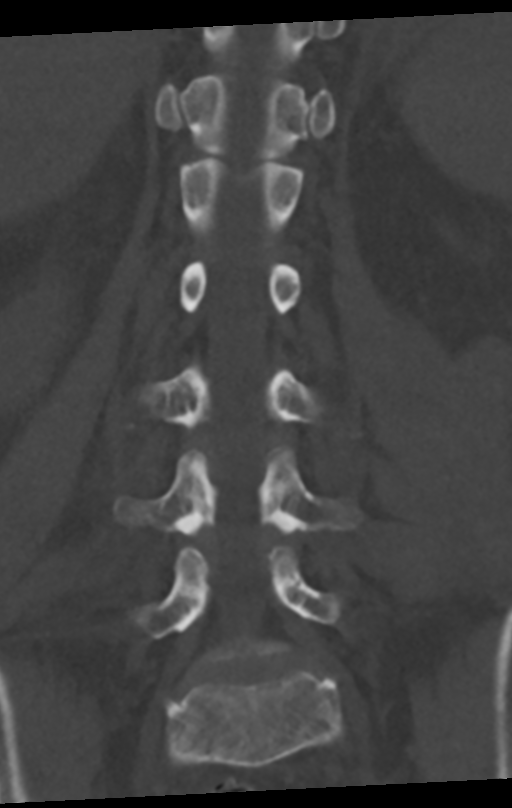

[12 of 33 positions shown; findings below may reference images not displayed]

FINDINGS: Segmentation: 5 lumbar type vertebrae.

Alignment: Normal.

Vertebrae: Unchanged L1 compression fracture with 60% vertebral body
height loss, heterogeneous lucency and gas anteriorly and centrally
in the vertebral body, and surrounding sclerosis most notable
posteriorly. No retropulsion or evidence of extraosseous tumor. No
new fracture. Background osteopenia.

Paraspinal and other soft tissues: Partially visualized small left
perinephric hematoma without enlargement from the previous abdominal
CT. Nonobstructing punctate renal calculi. Partially visualized
small volume ascites. Age advanced atherosclerosis. Partially
visualized gallstones.

Disc levels: Unchanged appearance of mild lumbar spondylosis and
mild lower lumbar facet hypertrophy compared to the prior lumbar CT
without evidence of compressive stenosis.
IMPRESSION: 1. Unchanged L1 compression fracture.
2. No acute osseous abnormality in the lumbar spine.
3. Persistent small left perinephric hematoma.

## 2019-08-23 IMAGING — US US ABDOMEN LIMITED
2 series · 14 of 25 positions shown · non-contrast
Comparison: CT abdomen pelvis [DATE]

CLINICAL DATA: Elevated liver enzymes

EXAM:
ULTRASOUND ABDOMEN LIMITED RIGHT UPPER QUADRANT

[Series 1: us abdomen limited · 13 of 57 slices shown (1 of 2)]
[im 1/57]
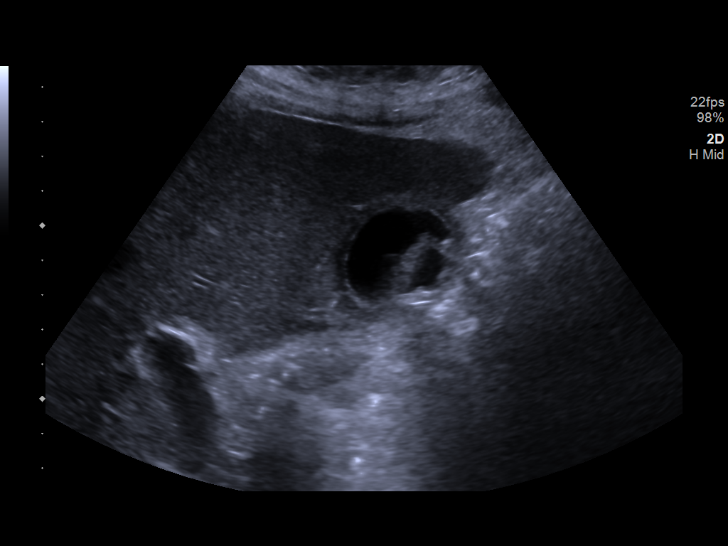
[im 5/57]
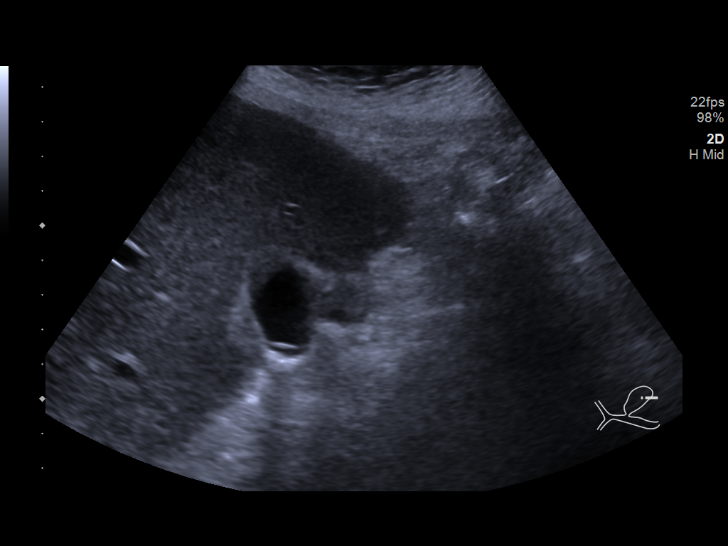
[im 10/57]
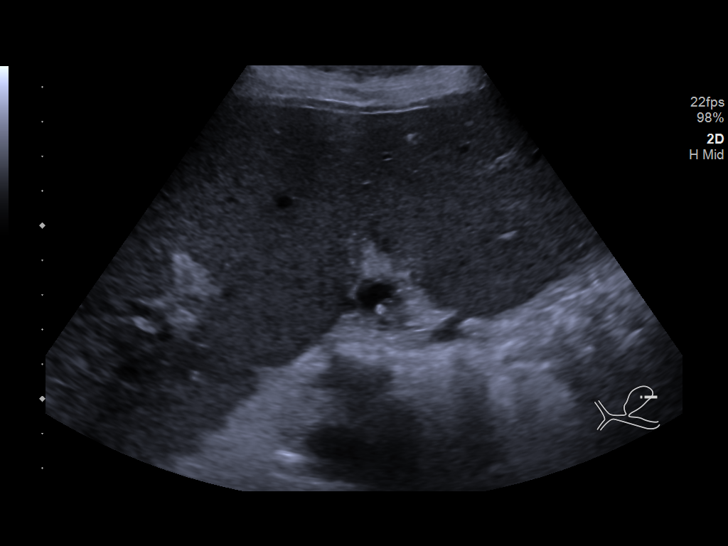
[im 15/57]
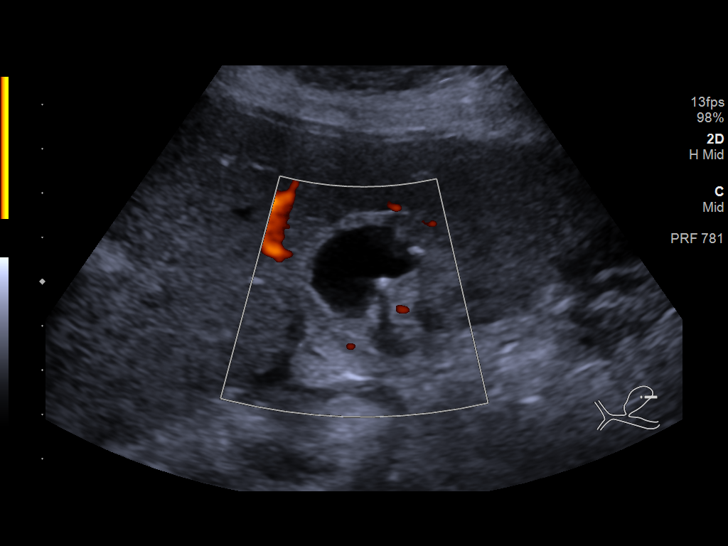
[im 20/57]
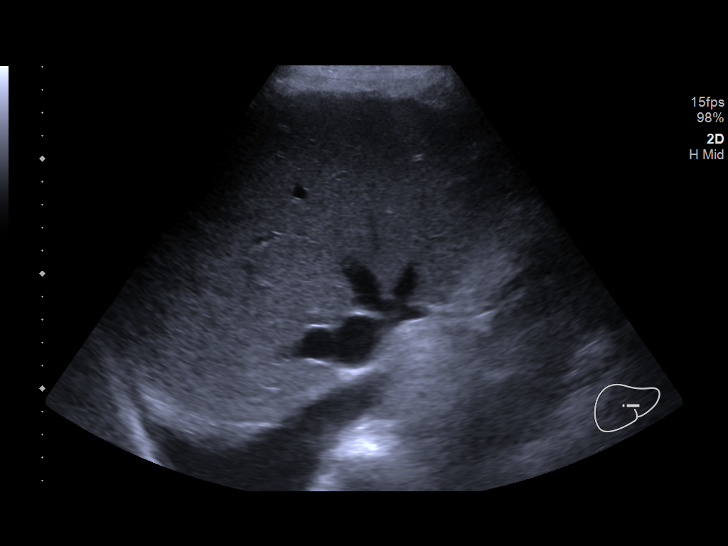
[im 22/57]
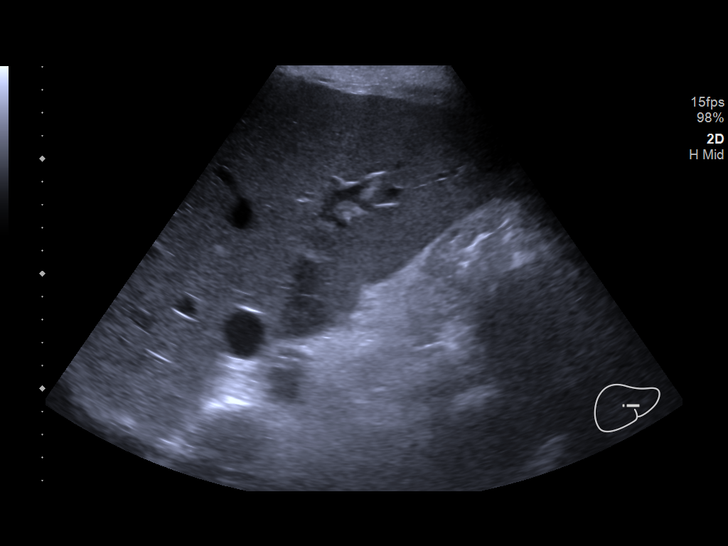
[im 27/57]
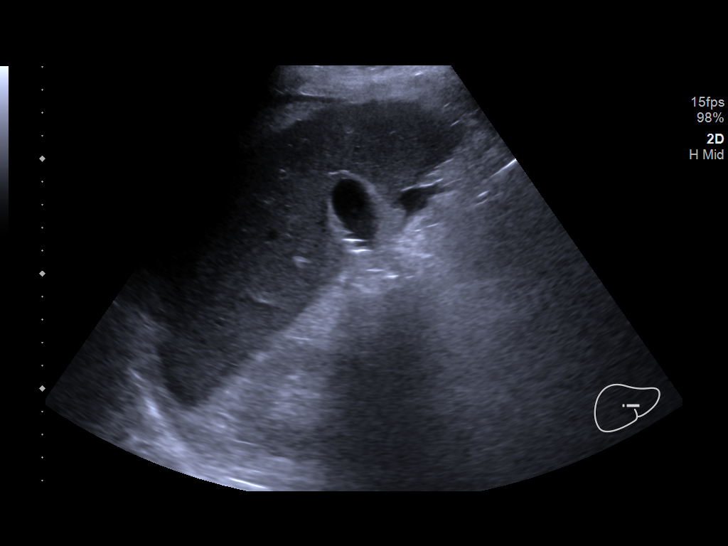
[im 32/57]
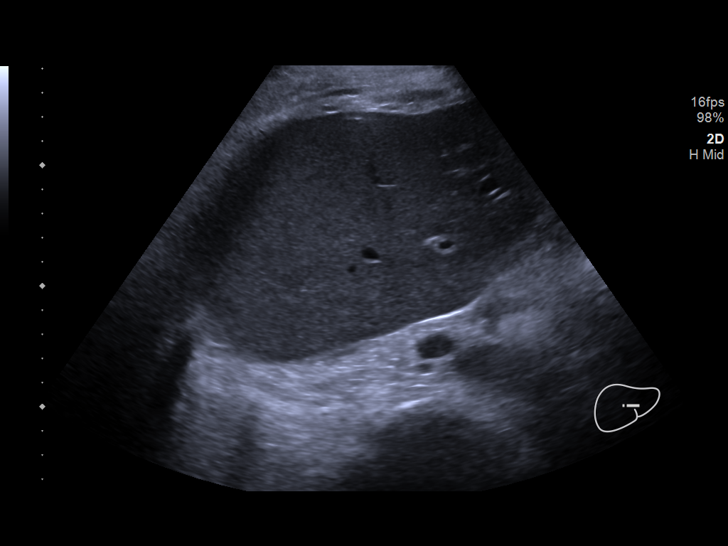
[im 37/57]
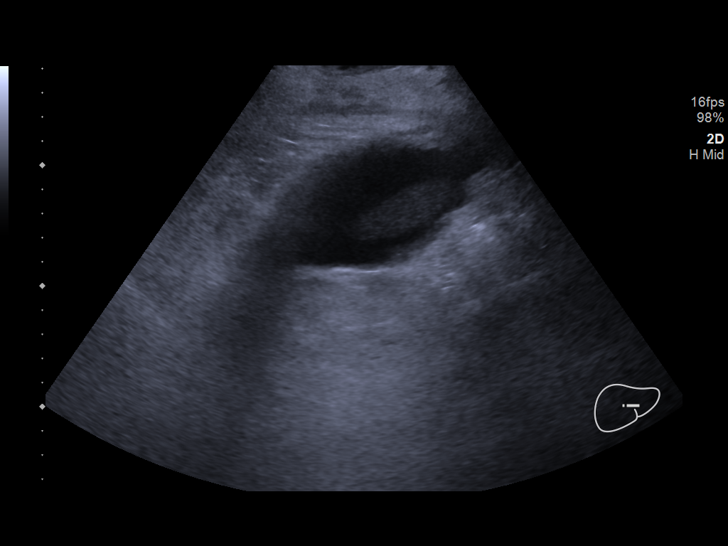
[im 39/57]
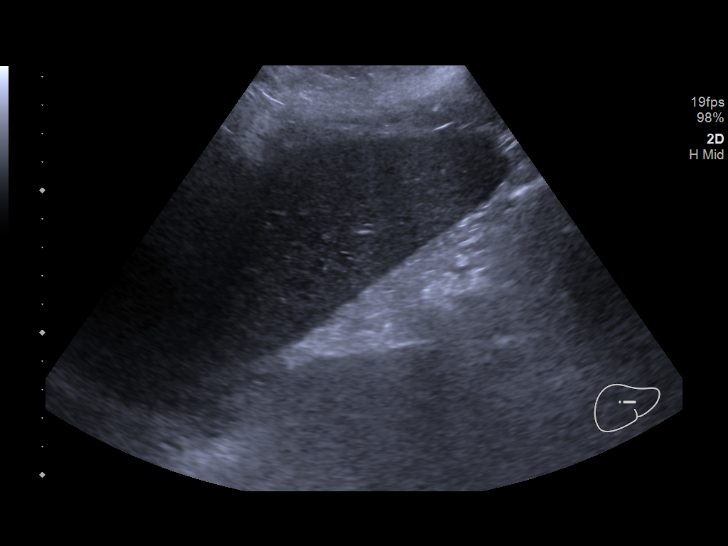
[im 44/57]
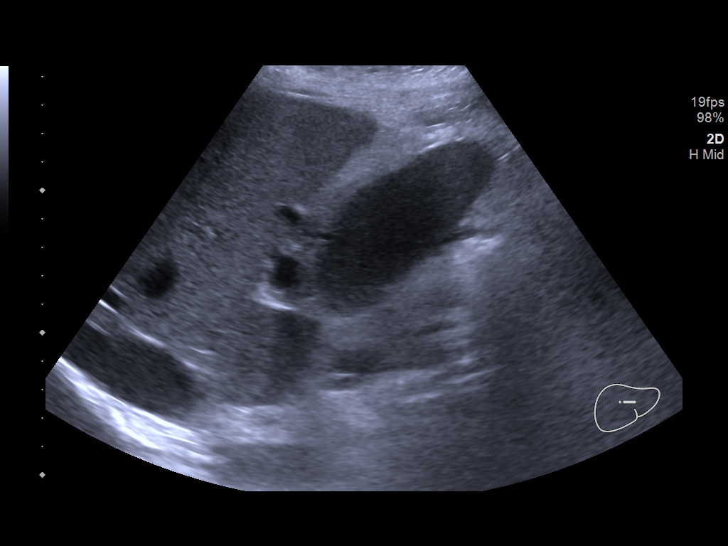
[im 49/57]
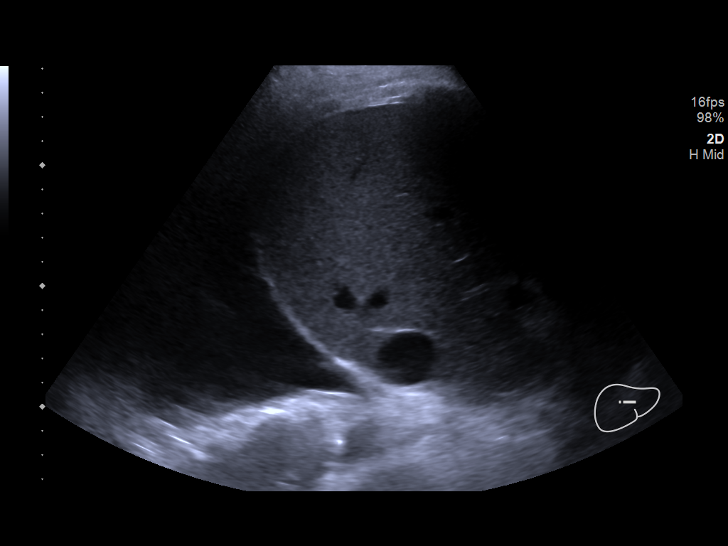
[im 54/57]
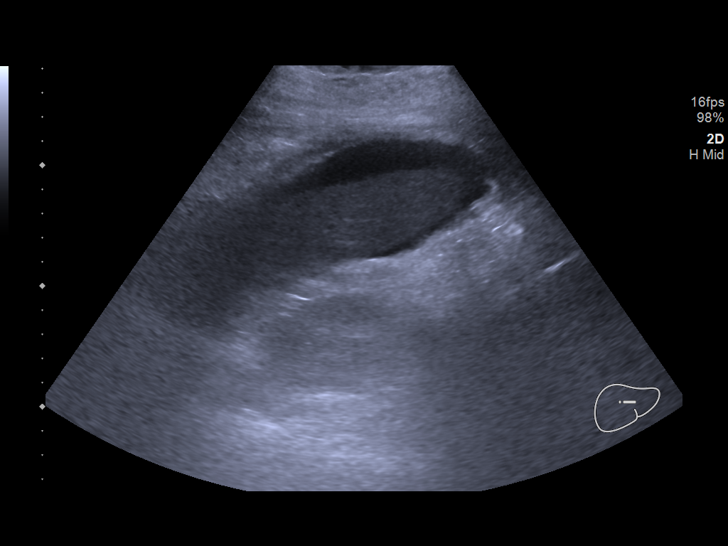

[Series 2: us abdomen limited · 1 of 1 slices shown (2 of 2)]
[im 1/1]
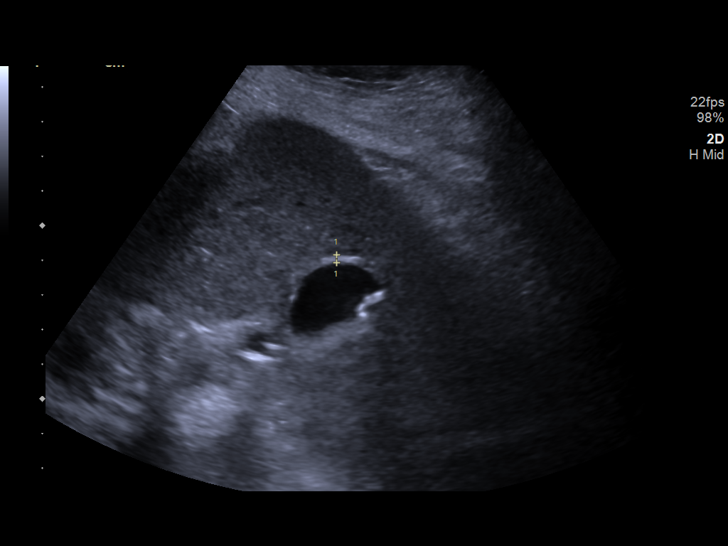

[14 of 25 positions shown; findings below may reference images not displayed]

FINDINGS: Gallbladder:

The gallbladder is decompressed which limits evaluation of wall
thickness. There are a few small gallstones measuring up to 0.4 cm.
No sonographic Murphy sign.

Common bile duct:

Diameter: 0.4 cm, within normal limits

Liver:

The liver appears enlarged. No focal lesion identified. Within
normal limits in parenchymal echogenicity. Portal vein is patent on
color Doppler imaging with normal direction of blood flow towards
the liver.

Other: Moderate size right pleural effusion. Possible increased
echogenicity of the right kidney incidentally noted.
IMPRESSION: 1. The liver appears enlarged. Normal parenchymal echogenicity. No
focal lesion identified.

2. The gallbladder is decompressed which limits evaluation.
Cholelithiasis without other evidence of cholecystitis.

3.  Moderate right pleural effusion.

4.  Possible increased echogenicity of the right kidney.

## 2019-08-23 MED ORDER — CARVEDILOL 3.125 MG PO TABS
3.1250 mg | ORAL_TABLET | Freq: Two times a day (BID) | ORAL | Status: DC
  Administered 2019-08-24 (×2): 3.125 mg via ORAL
  Filled 2019-08-23 (×3): qty 1

## 2019-08-23 MED ORDER — TRAZODONE HCL 50 MG PO TABS
50.0000 mg | ORAL_TABLET | Freq: Every day | ORAL | Status: DC
  Administered 2019-08-23 – 2019-08-24 (×2): 50 mg via ORAL
  Filled 2019-08-23 (×2): qty 1

## 2019-08-23 MED ORDER — ALBUMIN HUMAN 25 % IV SOLN
25.0000 g | INTRAVENOUS | Status: DC
  Administered 2019-08-23: 25 g via INTRAVENOUS
  Filled 2019-08-23 (×2): qty 100

## 2019-08-23 MED ORDER — ALBUMIN HUMAN 25 % IV SOLN
25.0000 g | INTRAVENOUS | Status: AC
  Administered 2019-08-23: 25 g via INTRAVENOUS
  Filled 2019-08-23: qty 100

## 2019-08-23 MED ORDER — MIDODRINE HCL 5 MG PO TABS
5.0000 mg | ORAL_TABLET | Freq: Three times a day (TID) | ORAL | Status: AC
  Administered 2019-08-23 – 2019-08-24 (×4): 5 mg via ORAL
  Filled 2019-08-23 (×4): qty 1

## 2019-08-23 MED ORDER — MIDODRINE HCL 5 MG PO TABS: 10.0000 mg | ORAL_TABLET | ORAL | Status: DC

## 2019-08-23 NOTE — Progress Notes (Signed)
Ultrasound called and stated pt needs to be NPO for six hours for imagining of liver. Technician stated sips with medication is [employer] pt, pt aware  Will continue to monitor

## 2019-08-23 NOTE — Progress Notes (Signed)
Called ultrasounds, spoke to [place] that pt has been NPO for six hours Technican stated she will call back when transport is available  Educated pt  Pt aware  Will continue to monitor

## 2019-08-23 NOTE — Progress Notes (Signed)
Patient ID: Melanie Hall, female   DOB: [birth year], 49 y.o.   MRN: [id number]  [facility] Progress Note   Assessment/ Plan:   1.  Volume overload/nephrotic syndrome: Secondary to progressive chronic kidney disease from renal biopsy-proven diabetic nephrosclerosis, improving on HD. 2. ESRD: New start on hemodialysis with renal biopsy showing diabetic kidney disease with advanced tubular atrophy/interstitial sclerosis.  Currently on TTS maintenance hemodialysis schedule as we await outpatient dialysis unit placement.  She is status post left brachiocephalic fistula on 12/11/3327 and with a functioning right IJ TDC catheter. 3. Anemia: Status post PRBC transfusion earlier, on intravenous iron/ESA for iron deficiency. 4. CKD-MBD: Calcium/phosphorus level currently at goal with hemodialysis/Auryxia. 5. Nutrition: Currently n.p.o. for right upper quadrant ultrasound.  Usually renal diet with fluid restriction, continue Nepro/renal MVI. 6. Hypertension: Blood pressures supported with midodrine, continue to monitor with HD.  Autonomic neuropathy from diabetes. 7.  Nausea/abdominal pain: Awaiting right upper quadrant ultrasound to visualize liver/gallbladder.  Subjective:   Complains of insomnia overnight, nausea with some abdominal discomfort and insists that she is not able to walk with her recliner (contrary to PT note).   Objective:   BP (!) 97/55 (BP Location: Right Arm)   Pulse 69   Temp 98.3 F (36.8 C) (Oral)   Resp 16   Ht 5\' 3"  (1.6 m)   Wt 80 kg   LMP 12/17/2016   SpO2 98%   BMI 31.25 kg/m   Physical Exam: Gen: Appears comfortable resting in bed, intermittently retching CVS: Pulse regular rhythm, normal rate, S1 and S2 normal.  Right IJ TDC Resp: Clear to auscultation with decreased breath sounds over bases, no rales/rhonchi Abd: Soft, flat, nontender Ext: Trace-1+ lower extremity edema, left BCF with audible bruit  Labs: BMET Recent Labs  Lab 08/17/19 1028  08/18/19 0543 08/19/19 0514 08/20/19 0131 08/21/19 0555 08/22/19 0639 08/23/19 0222  NA 133* 134* 136 133* 136 133* 131*  K 5.5* 5.7* 4.4 4.4 3.9 4.4 4.0  CL 99 98 99 95* 96* 96* 94*  CO2 20* 20* 23 23 26 25 25   GLUCOSE 222* 58* 105* 165* 120* 136* 166*  BUN 53* 63* 32* 40* 25* 40* 25*  CREATININE 5.82* 6.55* 4.29* 5.48* 3.98* 5.23* 3.60*  CALCIUM 8.1* 8.5* 8.2* 8.1* 8.0* 8.2* 7.6*  PHOS 7.0* 7.6* 5.0* 5.3* 3.7 4.4 3.4   CBC Recent Labs  Lab 08/20/19 0131 08/21/19 0555 08/22/19 0639 08/23/19 0222  WBC 12.8* 11.8* 10.9* 10.1  HGB 7.5* 7.8* 8.3* 7.9*  HCT 24.9* 25.2* 27.4* 26.5*  MCV 98.4 96.9 101.9* 100.4*  PLT 133* 126* 131* 134*     Medications:    . carvedilol  6.25 mg Oral BID WC  . Chlorhexidine Gluconate Cloth  6 each Topical Q0600  . Chlorhexidine Gluconate Cloth  6 each Topical Q0600  . darbepoetin (ARANESP) injection - DIALYSIS  100 mcg Intravenous Q Tue-HD  . feeding supplement (NEPRO CARB STEADY)  237 mL Oral Q24H  . feeding supplement (PRO-STAT SUGAR FREE 64)  30 mL Oral TID BM  . ferric citrate  420 mg Oral TID WC  . gabapentin  100 mg Oral BID  . insulin aspart  0-9 Units Subcutaneous TID WC  . levothyroxine  25 mcg Oral Daily  . midodrine  10 mg Oral Once  . multivitamin  1 tablet Oral QHS  . pantoprazole  40 mg Oral QHS  . polyethylene glycol  17 g Oral Daily  . pravastatin  40 mg Oral  [id number]   Elmarie Shiley, MD 08/23/2019, 10:01 AM

## 2019-08-23 NOTE — Progress Notes (Addendum)
PROGRESS NOTE    Melanie Hall  WUJ:[id number] DOB: 1970-08-13 DOA: 08/08/2019  PCP: [facility] [place]    Brief Narrative:  49 year old female with a medical history significant for CKD 5 which has progressed to end-stage renal disease needing hemodialysis, hyperlipidemia, type 2 diabetes who presented to [facility], ED for strokelike symptoms. CT/MRI negative. She started having worsening renal function. The nephrology team was consulted and she underwent tunneled HD catheterization on 2/2,renal biopsy and HD on 2/3.CT of the abdomen and pelvis on 2/5 showedpossible pericardial effusion but follow-up echo showed mild pericardial effusion without tamponade.2/8 had L AV fistula by vasc surg.She is uninsured and waiting for discharge once outpatient hemodialysis is arranged.  Assessment & Plan:   Principal Problem:   Acute on chronic renal failure (HCC) Active Problems:   Type 2 diabetes with nephropathy (HCC)   HYPERCHOLESTEROLEMIA   GERD (gastroesophageal reflux disease)   MGUS (monoclonal gammopathy of unknown significance)   Accelerated hypertension   Right arm weakness   Anemia in chronic kidney disease  Clinical problems list 1.  Transient ischemic attack/strokelike symptoms 2.  End-stage renal disease on hemodialysis 3.  Diabetes mellitus type 2 4.  Hyperlipidemia 5.  Gastroesophageal reflux disease 6.  Constipation 7.  Debility 8.  History of migraine 9.  Essential hypertension 10.  Bilateral lower extremity paresis/fecal incontinence 11.  Transaminitis  1.  Transient ischemic attack/strokelike symptoms.  Patient was transferred from [facility] with strokelike symptoms.  CT and MRI of the brain was negative for any acute intracranial process.  Patient subsequently developed worsening renal function likely from contrast-induced nephropathy.  She has no neurological deficit. Continue with aspirin and statin  2.  End-stage renal disease on  hemodialysis (Tuesdays, Thursdays and Saturdays).  Patient is status post hemodialysis today. Nephrology is following.  Awaiting outpatient hemodialysis chair prior to discharge.  3.  Diabetes mellitus type 2 Continue with sliding scale insulin Fingersticks before meals and at bedtime Hypoglycemic protocol  4.  Hyperlipidemia Continue with pravastatin 40 mg daily  5.  Gastroesophageal reflux disease Continue with Protonix daily  7.  Debility PT and OT on board.  Patient may need home physical therapy at discharge  8.  History of migraine.  Appears to be without symptoms though has been complaining of nausea. We will consider initiating Topamax for long-term management of migraine  9.  Essential hypertension.  Blood pressure currently running low Patient on midodrine 10 mg 3 times a week Continue with low-dose Coreg Hold all antihypertensive medications for now  10.  Bilateral lower extremity weakness/fecal incontinence Patient endorsed weakness and inability to ambulate.  Reported fecal incontinence.  She however denies any excruciating back pain. Physical examination was notable for left lower extremity muscle strength of 3/5 at 5/5 on right lower extremity.  SLR was negative for any significant pain.  Sensation appears intact.  Possible autonomic neuropathy/myelopathy.  Patient with a history of monoclonal gammopathy of undetermined significance with 1% risk of transition to multiple myeloma I will obtain CT lumbar spine without contrast  11.  Transaminitis.  Elevated liver enzymes Will obtain hepatitis panel and liver ultrasound Avoid hepatotoxic medication Discontinued pravastatin    DVT prophylaxis: Heparin subcute Code Status: Full code  Family Communication: None at bedside  Disposition Plan: Patient is from home and plan is to discharge home Barriers to discharge include outpatient hemodialysis arrangement  Consultants:   Nephrology  Procedures:  08/11/2019:  Tunneled catheter for hemodialysis 08/12/2019: Renal biopsy 08/17/2019: Left  brachiocephalic AV fistula  Antimicrobials:  None   Subjective: Patient was seen and examined at bedside.  Still complains of lower extremity weakness and inability to ambulate.  Endorsed recent fecal incontinence.  Denies any excruciating back pain.  Left lower extremity paresis with intact sensation.  Negative SLR Denied any chest pain, palpitation or diaphoresis. No acute overnight event was reported  Objective: Vitals:   08/22/19 2040 08/22/19 2342 08/23/19 0608 08/23/19 0817  BP: 121/69 111/67 105/64 (!) 97/55  Pulse: 72 71 68 69  Resp: '18 15 16 16  ' Temp: 98.2 F (36.8 C) 97.9 F (36.6 C) 98.3 F (36.8 C)   TempSrc: Oral Oral Oral   SpO2: 99% 97% 98%   Weight:   80 kg   Height:        Intake/Output Summary (Last 24 hours) at 08/23/2019 1113 Last data filed at 08/23/2019 1106 Gross per 24 hour  Intake 620 ml  Output 2000 ml  Net -1380 ml   Filed Weights   08/22/19 0715 08/22/19 1115 08/23/19 0608  Weight: 81.5 kg 78.3 kg 80 kg    Examination:  General exam: Appears calm and comfortable.  Not in acute distress Respiratory system: Clear to auscultation. Respiratory effort normal. Cardiovascular system: S1 & S2 heard, RRR. No JVD, murmurs, rubs, gallops or clicks. No pedal edema. Gastrointestinal system: Abdomen is nondistended, soft and nontender. No organomegaly or masses felt. Normal bowel sounds heard. Central nervous system: Alert and oriented.  No focal neurological deficits. Extremities: Left lower extremity 3/5 muscle strength 5/5 on right lower extremity.  Intact sensation and negative SLR.   Skin: Left AV fistula.  No erythema  Psychiatry: Judgement and insight appear normal. Mood & affect appropriate.     Data Reviewed: I have personally reviewed following labs and imaging studies  CBC: Recent Labs  Lab 08/19/19 0514 08/20/19 0131 08/21/19 0555 08/22/19 0639 08/23/19 0222   WBC 11.6* 12.8* 11.8* 10.9* 10.1  HGB 7.9* 7.5* 7.8* 8.3* 7.9*  HCT 25.8* 24.9* 25.2* 27.4* 26.5*  MCV 98.1 98.4 96.9 101.9* 100.4*  PLT 158 133* 126* 131* 292*   Basic Metabolic Panel: Recent Labs  Lab 08/19/19 0514 08/20/19 0131 08/21/19 0555 08/22/19 0639 08/23/19 0222  NA 136 133* 136 133* 131*  K 4.4 4.4 3.9 4.4 4.0  CL 99 95* 96* 96* 94*  CO2 '23 23 26 25 25  ' GLUCOSE 105* 165* 120* 136* 166*  BUN 32* 40* 25* 40* 25*  CREATININE 4.29* 5.48* 3.98* 5.23* 3.60*  CALCIUM 8.2* 8.1* 8.0* 8.2* 7.6*  MG  --   --   --   --  1.7  PHOS 5.0* 5.3* 3.7 4.4 3.4   GFR: Estimated Creatinine Clearance: 18.9 mL/min (A) (by C-G formula based on SCr of 3.6 mg/dL (H)). Liver Function Tests: Recent Labs  Lab 08/19/19 0514 08/20/19 0131 08/21/19 0555 08/22/19 0639 08/23/19 0222  AST  --   --   --   --  158*  ALT  --   --   --   --  262*  ALKPHOS  --   --   --   --  245*  BILITOT  --   --   --   --  1.3*  PROT  --   --   --   --  5.8*  ALBUMIN 2.5* 2.4* 2.2* 2.2* 2.1*   No results for input(s): LIPASE, AMYLASE in the last 168 hours. No results for input(s): AMMONIA in the last 168 hours.  Coagulation Profile: No results for input(s): INR, PROTIME in the last 168 hours. Cardiac Enzymes: No results for input(s): CKTOTAL, CKMB, CKMBINDEX, TROPONINI in the last 168 hours. BNP (last 3 results) No results for input(s): PROBNP in the last 8760 hours. HbA1C: No results for input(s): HGBA1C in the last 72 hours. CBG: Recent Labs  Lab 08/22/19 1547 08/22/19 1829 08/22/19 2129 08/23/19 0610 08/23/19 1107  GLUCAP 174* 170* 189* 155* 200*   Lipid Profile: No results for input(s): CHOL, HDL, LDLCALC, TRIG, CHOLHDL, LDLDIRECT in the last 72 hours. Thyroid Function Tests: No results for input(s): TSH, T4TOTAL, FREET4, T3FREE, THYROIDAB in the last 72 hours. Anemia Panel: No results for input(s): VITAMINB12, FOLATE, FERRITIN, TIBC, IRON, RETICCTPCT in the last 72 hours. Sepsis  Labs: No results for input(s): PROCALCITON, LATICACIDVEN in the last 168 hours.  Recent Results (from the past 240 hour(s))  Surgical pcr screen     Status: None   Collection Time: 08/16/19  9:18 PM   Specimen: Nasal Mucosa; Nasal Swab  Result Value Ref Range Status   MRSA, PCR NEGATIVE NEGATIVE Final   Staphylococcus aureus NEGATIVE NEGATIVE Final    Comment: (NOTE) The Xpert SA Assay (FDA approved for NASAL specimens in patients 32 years of age and older), is one component of a comprehensive surveillance program. It is not intended to diagnose infection nor to guide or monitor treatment. Performed at [facility], [place] [address], [place], [place] [address]          Radiology Studies: No results found.      Scheduled Meds: . carvedilol  3.125 mg Oral BID WC  . Chlorhexidine Gluconate Cloth  6 each Topical Q0600  . Chlorhexidine Gluconate Cloth  6 each Topical Q0600  . darbepoetin (ARANESP) injection - DIALYSIS  100 mcg Intravenous Q Tue-HD  . feeding supplement (NEPRO CARB STEADY)  237 mL Oral Q24H  . feeding supplement (PRO-STAT SUGAR FREE 64)  30 mL Oral TID BM  . ferric citrate  420 mg Oral TID WC  . gabapentin  100 mg Oral BID  . insulin aspart  0-9 Units Subcutaneous TID WC  . levothyroxine  25 mcg Oral Daily  . [START ON 08/25/2019] midodrine  10 mg Oral Q T,Th,Sa-HD  . multivitamin  1 tablet Oral QHS  . pantoprazole  40 mg Oral QHS  . polyethylene glycol  17 g Oral Daily  . pravastatin  40 mg Oral q1800  . traZODone  50 mg Oral QHS   Continuous Infusions: . sodium chloride    . sodium chloride    . ferric gluconate (FERRLECIT/NULECIT) IV Stopped (08/21/19 1105)     LOS: 15 days    Time spent: 35 minutes   Elie Confer, MD [facility] Pager [phone]   If 7PM-7AM, please contact night-coverage [email or web address] Password [facility] 08/23/2019, 11:13 AM

## 2019-08-24 DIAGNOSIS — E7800 Pure hypercholesterolemia, unspecified: Secondary | ICD-10-CM

## 2019-08-24 DIAGNOSIS — D631 Anemia in chronic kidney disease: Secondary | ICD-10-CM

## 2019-08-24 DIAGNOSIS — D472 Monoclonal gammopathy: Secondary | ICD-10-CM

## 2019-08-24 DIAGNOSIS — N186 End stage renal disease: Secondary | ICD-10-CM

## 2019-08-24 LAB — CBC
HCT: 25.1 % — ABNORMAL LOW (ref 36.0–46.0)
Hemoglobin: 7.7 g/dL — ABNORMAL LOW (ref 12.0–15.0)
MCH: 30.9 pg (ref 26.0–34.0)
MCHC: 30.7 g/dL (ref 30.0–36.0)
MCV: 100.8 fL — ABNORMAL HIGH (ref 80.0–100.0)
Platelets: 137 10*3/uL — ABNORMAL LOW (ref 150–400)
RBC: 2.49 MIL/uL — ABNORMAL LOW (ref 3.87–5.11)
RDW: 17.7 % — ABNORMAL HIGH (ref 11.5–15.5)
WBC: 8.7 10*3/uL (ref 4.0–10.5)
nRBC: 0 % (ref 0.0–0.2)

## 2019-08-24 LAB — GLUCOSE, CAPILLARY
Glucose-Capillary: 105 mg/dL — ABNORMAL HIGH (ref 70–99)
Glucose-Capillary: 162 mg/dL — ABNORMAL HIGH (ref 70–99)
Glucose-Capillary: 132 mg/dL — ABNORMAL HIGH (ref 70–99)
Glucose-Capillary: 122 mg/dL — ABNORMAL HIGH (ref 70–99)
Glucose-Capillary: 98 mg/dL (ref 70–99)

## 2019-08-24 LAB — COMPREHENSIVE METABOLIC PANEL
ALT: 158 U/L — ABNORMAL HIGH (ref 0–44)
AST: 55 U/L — ABNORMAL HIGH (ref 15–41)
Albumin: 2.8 g/dL — ABNORMAL LOW (ref 3.5–5.0)
Alkaline Phosphatase: 249 U/L — ABNORMAL HIGH (ref 38–126)
Anion gap: 16 — ABNORMAL HIGH (ref 5–15)
BUN: 41 mg/dL — ABNORMAL HIGH (ref 6–20)
CO2: 24 mmol/L (ref 22–32)
Calcium: 8.2 mg/dL — ABNORMAL LOW (ref 8.9–10.3)
Chloride: 92 mmol/L — ABNORMAL LOW (ref 98–111)
Creatinine, Ser: 5.2 mg/dL — ABNORMAL HIGH (ref 0.44–1.00)
GFR calc Af Amer: 10 mL/min — ABNORMAL LOW (ref >60)
GFR calc non Af Amer: 9 mL/min — ABNORMAL LOW (ref >60)
Glucose, Bld: 158 mg/dL — ABNORMAL HIGH (ref 70–99)
Potassium: 4.2 mmol/L (ref 3.5–5.1)
Sodium: 132 mmol/L — ABNORMAL LOW (ref 135–145)
Total Bilirubin: 1.1 mg/dL (ref 0.3–1.2)
Total Protein: 6.1 g/dL — ABNORMAL LOW (ref 6.5–8.1)

## 2019-08-24 LAB — PHOSPHORUS: Phosphorus: 4.5 mg/dL (ref 2.5–4.6)

## 2019-08-24 LAB — MAGNESIUM: Magnesium: 1.8 mg/dL (ref 1.7–2.4)

## 2019-08-24 MED ORDER — HEPARIN SODIUM (PORCINE) 5000 UNIT/ML IJ SOLN
5000.0000 [IU] | Freq: Three times a day (TID) | INTRAMUSCULAR | Status: DC
  Administered 2019-08-24 (×2): 5000 [IU] via SUBCUTANEOUS
  Filled 2019-08-24 (×3): qty 1

## 2019-08-24 MED ORDER — SODIUM CHLORIDE 0.9 % IV SOLN
125.0000 mg | INTRAVENOUS | Status: DC
  Administered 2019-08-25: 12:00:00 125 mg via INTRAVENOUS
  Filled 2019-08-24: qty 10

## 2019-08-24 NOTE — Progress Notes (Signed)
Patient's daughter returned Navigator's call and shared a little about her story with her mother. She states she and her 3 sisters did not have much of a relationship with their mother until she "started getting sick." She reports that she is the oldest and encourages her younger sisters (5, 71, 23) to be involved with their mom so they don't "look back with regret." She states she formed a relationship with her mother again after she received a call about a year ago when a doctor called her after her mother came out of surgery and was told "you are listed as your mother's emergency contact." She states her mother had fallen and she refused to go to a rehab to get stronger. She stated appreciation for Renal Navigator's patience with her mother last week as she knows her mother "can be difficult." She appreciated the update today.  Alphonzo Cruise, [place] Renal Navigator [phone]

## 2019-08-24 NOTE — Progress Notes (Addendum)
Patient ID: Melanie Hall, female   DOB: 06/29/1971, 49 y.o.   MRN: [id number]  [facility] Progress Note   Assessment/ Plan:   1.  Volume overload/nephrotic syndrome: Secondary to progressive chronic kidney disease from renal biopsy-proven diabetic nephrosclerosis, improving on HD.   2. ESRD: New start on hemodialysis (on 2/3) with renal biopsy showing diabetic kidney disease with advanced tubular atrophy/interstitial sclerosis.  Currently on TTS maintenance hemodialysis schedule as we await outpatient dialysis unit placement.  She is status post left brachiocephalic fistula on 5/0/0938 and with a functioning right IJ TDC catheter.  Cr peaked at ~8 during hospital course and is down to 3.6 on 2/14. Metabolic acidosis resolved with sodium bicarbonate and wad discontinued. 3. Anemia: Status post PRBC transfusions during hospital course, on intravenous iron/ESA for iron deficiency. 4. CKD-MBD: Calcium/phosphorus level currently at goal with hemodialysis/ferric citrate. 5. Nutrition: renal diet with fluid restriction, continue Nepro/renal MVI. 6. Hypertension: Blood pressures supported with midodrine, continue to monitor with HD.  Autonomic neuropathy from diabetes. 7.  Nausea/abdominal pain: right upper quadrant ultrasound with stones present and no cholecystitis,  enlarged liver with normal parenchyma. Hepatitis panel negative.   Subjective:   Patient endorses nausea and hiccups, abdominal discomfort, with fair appetite. Denies pruritis, dyspnea, flank pain. Patient endorses HD has been going well, with minimal/mild cramping at end of treatments. Next HD tomorrow.     Objective:   BP 113/72   Pulse 68   Temp 98.5 F (36.9 C) (Oral)   Resp 17   Ht 5\' 3"  (1.6 m)   Wt 80 kg   LMP 12/17/2016   SpO2 98%   BMI 31.25 kg/m   Physical Exam: Gen: Reclined in bed watching TV, emesis bag in lap, not actively retching  CVS: RRR, S1 / S2 distinct. Right IJ TDC without signs of  infection Resp: Clear to auscultation, decreased breath sounds at bases, no crackles Abd: BS present, soft, non-distended  Ext: 2+ lower extremity edema, left BCF with bruit and no signs of infection   Labs: BMET Recent Labs  Lab 08/18/19 0543 08/19/19 0514 08/20/19 0131 08/21/19 0555 08/22/19 0639 08/23/19 0222  NA 134* 136 133* 136 133* 131*  K 5.7* 4.4 4.4 3.9 4.4 4.0  CL 98 99 95* 96* 96* 94*  CO2 20* 23 23 26 25 25   GLUCOSE 58* 105* 165* 120* 136* 166*  BUN 63* 32* 40* 25* 40* 25*  CREATININE 6.55* 4.29* 5.48* 3.98* 5.23* 3.60*  CALCIUM 8.5* 8.2* 8.1* 8.0* 8.2* 7.6*  PHOS 7.6* 5.0* 5.3* 3.7 4.4 3.4   CBC Recent Labs  Lab 08/20/19 0131 08/21/19 0555 08/22/19 0639 08/23/19 0222  WBC 12.8* 11.8* 10.9* 10.1  HGB 7.5* 7.8* 8.3* 7.9*  HCT 24.9* 25.2* 27.4* 26.5*  MCV 98.4 96.9 101.9* 100.4*  PLT 133* 126* 131* 134*     Medications:    . carvedilol  3.125 mg Oral BID WC  . Chlorhexidine Gluconate Cloth  6 each Topical Q0600  . Chlorhexidine Gluconate Cloth  6 each Topical Q0600  . darbepoetin (ARANESP) injection - DIALYSIS  100 mcg Intravenous Q Tue-HD  . feeding supplement (NEPRO CARB STEADY)  237 mL Oral Q24H  . feeding supplement (PRO-STAT SUGAR FREE 64)  30 mL Oral TID BM  . ferric citrate  420 mg Oral TID WC  . gabapentin  100 mg Oral BID  . heparin  5,000 Units Subcutaneous Q8H  . insulin aspart  0-9 Units Subcutaneous TID WC  .  levothyroxine  25 mcg Oral Daily  . [START ON 08/25/2019] midodrine  10 mg Oral Q T,Th,Sa-HD  . midodrine  5 mg Oral TID WC  . multivitamin  1 tablet Oral QHS  . pantoprazole  40 mg Oral QHS  . polyethylene glycol  17 g Oral Daily  . traZODone  50 mg Oral QHS     Harrell Gave, [place] STUDENT 08/24/2019, 11:21 AM   I have seen and examined this patient and agree with plan and assessment in the above note with renal recommendations/intervention highlighted.  Will continue with HD on TTS schedule while she remains an inpatient.   Will need to arrange for [facility] unit as she was followed by [facility]. [place] John A Caliyah Sieh,MD 08/24/2019 3:20 PM

## 2019-08-24 NOTE — Progress Notes (Signed)
PROGRESS NOTE    Melanie Hall  SEG:[id number] DOB: 12/14/1970 DOA: 08/08/2019 PCP: [facility] [place]   Brief Narrative:  49 year old female with a medical history significant for CKD 5 which has progressed to end-stage renal disease needing hemodialysis, hyperlipidemia, type 2 diabetes who presented to [facility], ED for strokelike symptoms. CT/MRI negative. She started having worsening renal function. The nephrology team was consulted and she underwent tunneled HD catheterization on 2/2,renal biopsy and HD on 2/3.CT of the abdomen and pelvis on 2/5 showedpossible pericardial effusion but follow-up echo showed mild pericardial effusion without tamponade.2/8 had L AV fistula by vasc surg.She is uninsured and waiting for discharge once outpatient hemodialysis is arranged.  Assessment & Plan:   Principal Problem:   Acute on chronic renal failure (HCC) Active Problems:   Type 2 diabetes with nephropathy (HCC)   HYPERCHOLESTEROLEMIA   GERD (gastroesophageal reflux disease)   MGUS (monoclonal gammopathy of unknown significance)   Accelerated hypertension   Right arm weakness   Anemia in chronic kidney disease  1.  Transient ischemic attack/strokelike symptoms.  Patient was transferred from [facility] with strokelike symptoms.  CT and MRI of the brain was negative for any acute intracranial process.  Patient subsequently developed worsening renal function likely from contrast-induced nephropathy.  She has no neurological deficit. Continue with aspirin daily, statin held for LFT abnormality  2.  End-stage renal disease on hemodialysis (Tuesdays, Thursdays and Saturdays).  Patient is status post hemodialysis today. Nephrology is following.  Awaiting outpatient hemodialysis chair prior to discharge.  3.  Diabetes mellitus type 2 Continue with sliding scale insulin Fingersticks before meals and at bedtime Hypoglycemic protocol  4.  Hyperlipidemia Held pravastatin  40 mg daily due to LFTs  5.  Gastroesophageal reflux disease Continue with Protonix daily  7.  Debility PT and OT on board.  Patient may need home physical therapy at discharge  8.  History of migraine.  Appears to be without symptoms though has been complaining of nausea.  9.  Essential hypertension.  Blood pressure currently running low Patient on midodrine 10 mg 3 times a week Continue with low-dose Coreg Hold all antihypertensive medications for now  10.  Bilateral lower extremity weakness/fecal incontinence Patient endorsed weakness and inability to ambulate.  Reported fecal incontinence.  She however denies any excruciating back pain. Physical examination was notable for left lower extremity muscle strength of 3/5 at 5/5 on right lower extremity.  SLR was negative for any significant pain.  Sensation appears intact.  Possible autonomic neuropathy/myelopathy.  Patient with a history of monoclonal gammopathy of undetermined significance with 1% risk of transition to multiple myeloma CT lumbar spine without contrast with old compression fracture but no acute spinal compression to explain symptoms  11.  Transaminitis.  Elevated liver enzymes hepatitis panel negative and liver ultrasound normal Morning labs not yet done to trend Avoid hepatotoxic medication Discontinued pravastatin  DVT prophylaxis: heparin [place] ordered today Code Status: full Family Communication: patient only Disposition Plan:  . Patient came from:home            . Anticipated d/c place:home . Barriers to d/c OR conditions which need to be met to effect a safe d/c:needs outpatient HD set up prior to discharge. She is medically able to discharge when this is accomplished.    Consultants:   nephrology   Antimicrobials:   none   Subjective: Feeling well overall, denies new concerns or complaints. No chest pains or SOB or diarrhea. No constipation  or abdominal pain.   Objective: Vitals:   08/23/19  1825 08/23/19 2326 08/24/19 0600 08/24/19 0809  BP: (!) 101/59 126/69 128/66 113/72  Pulse: 68 70 66 68  Resp: '18 18 17   ' Temp:  98.8 F (37.1 C) 98.5 F (36.9 C)   TempSrc:  Oral Oral   SpO2: 96% 97% 98%   Weight:      Height:        Intake/Output Summary (Last 24 hours) at 08/24/2019 1036 Last data filed at 08/24/2019 0330 Gross per 24 hour  Intake 512.55 ml  Output --  Net 512.55 ml   Filed Weights   08/22/19 0715 08/22/19 1115 08/23/19 0608  Weight: 81.5 kg 78.3 kg 80 kg    Examination:  General exam: Appears calm and comfortable  Respiratory system: Clear to auscultation. Respiratory effort normal. Cardiovascular system: S1 & S2 heard, RRR. No JVD, murmurs, rubs, gallops or clicks. No pedal edema. Gastrointestinal system: Abdomen is nondistended, soft and nontender. No organomegaly or masses felt. Normal bowel sounds heard. Central nervous system: Alert and oriented. No focal neurological deficits. Extremities: Symmetric 5 x 5 power. Skin: No rashes, lesions or ulcers Psychiatry: Judgement and insight appear normal. Mood & affect appropriate.   Data Reviewed: I have personally reviewed following labs and imaging studies  CBC: Recent Labs  Lab 08/19/19 0514 08/20/19 0131 08/21/19 0555 08/22/19 0639 08/23/19 0222  WBC 11.6* 12.8* 11.8* 10.9* 10.1  HGB 7.9* 7.5* 7.8* 8.3* 7.9*  HCT 25.8* 24.9* 25.2* 27.4* 26.5*  MCV 98.1 98.4 96.9 101.9* 100.4*  PLT 158 133* 126* 131* 852*   Basic Metabolic Panel: Recent Labs  Lab 08/19/19 0514 08/20/19 0131 08/21/19 0555 08/22/19 0639 08/23/19 0222  NA 136 133* 136 133* 131*  K 4.4 4.4 3.9 4.4 4.0  CL 99 95* 96* 96* 94*  CO2 '23 23 26 25 25  ' GLUCOSE 105* 165* 120* 136* 166*  BUN 32* 40* 25* 40* 25*  CREATININE 4.29* 5.48* 3.98* 5.23* 3.60*  CALCIUM 8.2* 8.1* 8.0* 8.2* 7.6*  MG  --   --   --   --  1.7  PHOS 5.0* 5.3* 3.7 4.4 3.4   GFR: Estimated Creatinine Clearance: 18.9 mL/min (A) (by C-G formula based on SCr  of 3.6 mg/dL (H)). Liver Function Tests: Recent Labs  Lab 08/19/19 0514 08/20/19 0131 08/21/19 0555 08/22/19 0639 08/23/19 0222  AST  --   --   --   --  158*  ALT  --   --   --   --  262*  ALKPHOS  --   --   --   --  245*  BILITOT  --   --   --   --  1.3*  PROT  --   --   --   --  5.8*  ALBUMIN 2.5* 2.4* 2.2* 2.2* 2.1*   No results for input(s): LIPASE, AMYLASE in the last 168 hours. No results for input(s): AMMONIA in the last 168 hours. Coagulation Profile: No results for input(s): INR, PROTIME in the last 168 hours. Cardiac Enzymes: No results for input(s): CKTOTAL, CKMB, CKMBINDEX, TROPONINI in the last 168 hours. BNP (last 3 results) No results for input(s): PROBNP in the last 8760 hours. HbA1C: No results for input(s): HGBA1C in the last 72 hours. CBG: Recent Labs  Lab 08/23/19 0610 08/23/19 1107 08/23/19 1612 08/23/19 2130 08/24/19 0602  GLUCAP 155* 200* 119* 212* 162*   Lipid Profile: No results for input(s): CHOL, HDL, LDLCALC,  TRIG, CHOLHDL, LDLDIRECT in the last 72 hours. Thyroid Function Tests: No results for input(s): TSH, T4TOTAL, FREET4, T3FREE, THYROIDAB in the last 72 hours. Anemia Panel: No results for input(s): VITAMINB12, FOLATE, FERRITIN, TIBC, IRON, RETICCTPCT in the last 72 hours. Sepsis Labs: No results for input(s): PROCALCITON, LATICACIDVEN in the last 168 hours.  Recent Results (from the past 240 hour(s))  Surgical pcr screen     Status: None   Collection Time: 08/16/19  9:18 PM   Specimen: Nasal Mucosa; Nasal Swab  Result Value Ref Range Status   MRSA, PCR NEGATIVE NEGATIVE Final   Staphylococcus aureus NEGATIVE NEGATIVE Final    Comment: (NOTE) The Xpert SA Assay (FDA approved for NASAL specimens in patients 39 years of age and older), is one component of a comprehensive surveillance program. It is not intended to diagnose infection nor to guide or monitor treatment. Performed at [facility], [place] [address],  [place], [place] [address]     Radiology Studies: CT LUMBAR SPINE WO CONTRAST  Result Date: 08/23/2019 CLINICAL DATA:  Fecal incontinence. Bilateral lower extremity weakness. EXAM: CT LUMBAR SPINE WITHOUT CONTRAST TECHNIQUE: Multidetector CT imaging of the lumbar spine was performed without intravenous contrast administration. Multiplanar CT image reconstructions were also generated. COMPARISON:  08/09/2019 lumbar spine CT. 08/13/2019 CT abdomen and pelvis. FINDINGS: Segmentation: 5 lumbar type vertebrae. Alignment: Normal. Vertebrae: Unchanged L1 compression fracture with 60% vertebral body height loss, heterogeneous lucency and gas anteriorly and centrally in the vertebral body, and surrounding sclerosis most notable posteriorly. No retropulsion or evidence of extraosseous tumor. No new fracture. Background osteopenia. Paraspinal and other soft tissues: Partially visualized small left perinephric hematoma without enlargement from the previous abdominal CT. Nonobstructing punctate renal calculi. Partially visualized small volume ascites. Age advanced atherosclerosis. Partially visualized gallstones. Disc levels: Unchanged appearance of mild lumbar spondylosis and mild lower lumbar facet hypertrophy compared to the prior lumbar CT without evidence of compressive stenosis. IMPRESSION: 1. Unchanged L1 compression fracture. 2. No acute osseous abnormality in the lumbar spine. 3. Persistent small left perinephric hematoma. Electronically Signed   By: Logan Bores M.D.   On: 08/23/2019 13:07   US Abdomen Limited RUQ  Result Date: 08/23/2019 CLINICAL DATA:  Elevated liver enzymes EXAM: ULTRASOUND ABDOMEN LIMITED RIGHT UPPER QUADRANT COMPARISON:  CT abdomen pelvis 08/13/2019 FINDINGS: Gallbladder: The gallbladder is decompressed which limits evaluation of wall thickness. There are a few small gallstones measuring up to 0.4 cm. No sonographic Murphy sign. Common bile duct: Diameter: 0.4 cm, within normal limits Liver:  The liver appears enlarged. No focal lesion identified. Within normal limits in parenchymal echogenicity. Portal vein is patent on color Doppler imaging with normal direction of blood flow towards the liver. Other: Moderate size right pleural effusion. Possible increased echogenicity of the right kidney incidentally noted. IMPRESSION: 1. The liver appears enlarged. Normal parenchymal echogenicity. No focal lesion identified. 2. The gallbladder is decompressed which limits evaluation. Cholelithiasis without other evidence of cholecystitis. 3.  Moderate right pleural effusion. 4.  Possible increased echogenicity of the right kidney. Electronically Signed   By: Audie Pinto M.D.   On: 08/23/2019 15:40   Scheduled Meds: . carvedilol  3.125 mg Oral BID WC  . Chlorhexidine Gluconate Cloth  6 each Topical Q0600  . Chlorhexidine Gluconate Cloth  6 each Topical Q0600  . darbepoetin (ARANESP) injection - DIALYSIS  100 mcg Intravenous Q Tue-HD  . feeding supplement (NEPRO CARB STEADY)  237 mL Oral Q24H  . feeding supplement (PRO-STAT SUGAR  FREE 64)  30 mL Oral TID BM  . ferric citrate  420 mg Oral TID WC  . gabapentin  100 mg Oral BID  . insulin aspart  0-9 Units Subcutaneous TID WC  . levothyroxine  25 mcg Oral Daily  . [START ON 08/25/2019] midodrine  10 mg Oral Q T,Th,Sa-HD  . midodrine  5 mg Oral TID WC  . multivitamin  1 tablet Oral QHS  . pantoprazole  40 mg Oral QHS  . polyethylene glycol  17 g Oral Daily  . traZODone  50 mg Oral QHS   Continuous Infusions: . [START ON 08/25/2019] ferric gluconate (FERRLECIT/NULECIT) IV      LOS: 16 days   Time spent: [place], MD [facility]   To contact the attending provider between 7A-7P or the covering provider during after hours 7P-7A, please log into the web site [email or web address] and access using universal [place] password for that web site. If you do not have the password, please call the hospital operator.  08/24/2019,  10:36 AM

## 2019-08-24 NOTE — Progress Notes (Signed)
Renal Navigator spoke with another [employer] teammate to request urgent look into patient's financial clearance situation as she needs OP HD arranged. Navigator will continue to follow closely. Called patient's daughter to provide update, but had to leave voicemail-asked daughter to call back if she would like or I will call her back once I have more information.  Alphonzo Cruise, [place] Renal Navigator [phone]

## 2019-08-24 NOTE — Progress Notes (Signed)
Renal Navigator spoke with [employer], who states their team is still working on trying to financially clear patient, who is uninsured, for outpatient HD.  Renal Navigator will continue to follow, but until she is cleared for OP HD, she cannot safely discharge from the hospital.  Alphonzo Cruise, [place] Renal Navigator [phone]

## 2019-08-24 NOTE — Progress Notes (Signed)
PT Cancellation Note  Patient Details Name: Melanie Hall MRN: [id number] DOB: 1970/10/04   Cancelled Treatment:    Reason Eval/Treat Not Completed: Fatigue/lethargy limiting ability to participate. Pt sleeping upon PT entry. Easily wakes to voice and asks that PT return another time, stating she is "too tired" to participate. Will check back as schedule allows to continue with PT POC.    Thelma Comp 08/24/2019, 9:43 AM   Rolinda Roan, PT, DPT Acute Rehabilitation Services Pager: [phone] Office: [phone]

## 2019-08-24 NOTE — Progress Notes (Signed)
Renal Navigator received call from [facility]. Patient has been financially cleared and accepted on a MWF schedule with a seat time of 3:00pm. She needs to arrive at 2:00pm on her first day of treatment, which will be Wednesday, 08/26/19.  Renal Navigator notified Nephrologist and sent message to Attending. Telephone call to inform patient's daughter, who will provide transportation. Daughter was understanding of plan and appreciative. Navigator attempted to call patient in room, but she did not answer. Since patient has not had HD since Saturday and has been on a TTS schedule in the hospital, plan for short tx tomorrow morning and then discharge.   Alphonzo Cruise, [place] Renal Navigator [phone]

## 2019-08-25 LAB — RENAL FUNCTION PANEL
Albumin: 2.5 g/dL — ABNORMAL LOW (ref 3.5–5.0)
Anion gap: 15 (ref 5–15)
BUN: 48 mg/dL — ABNORMAL HIGH (ref 6–20)
CO2: 24 mmol/L (ref 22–32)
Calcium: 8.4 mg/dL — ABNORMAL LOW (ref 8.9–10.3)
Chloride: 92 mmol/L — ABNORMAL LOW (ref 98–111)
Creatinine, Ser: 5.74 mg/dL — ABNORMAL HIGH (ref 0.44–1.00)
GFR calc Af Amer: 9 mL/min — ABNORMAL LOW (ref >60)
GFR calc non Af Amer: 8 mL/min — ABNORMAL LOW (ref >60)
Glucose, Bld: 81 mg/dL (ref 70–99)
Phosphorus: 5.1 mg/dL — ABNORMAL HIGH (ref 2.5–4.6)
Potassium: 4.7 mmol/L (ref 3.5–5.1)
Sodium: 131 mmol/L — ABNORMAL LOW (ref 135–145)

## 2019-08-25 LAB — CBC
HCT: 25.7 % — ABNORMAL LOW (ref 36.0–46.0)
Hemoglobin: 7.7 g/dL — ABNORMAL LOW (ref 12.0–15.0)
MCH: 30.6 pg (ref 26.0–34.0)
MCHC: 30 g/dL (ref 30.0–36.0)
MCV: 102 fL — ABNORMAL HIGH (ref 80.0–100.0)
Platelets: 145 10*3/uL — ABNORMAL LOW (ref 150–400)
RBC: 2.52 MIL/uL — ABNORMAL LOW (ref 3.87–5.11)
RDW: 17.9 % — ABNORMAL HIGH (ref 11.5–15.5)
WBC: 8.5 10*3/uL (ref 4.0–10.5)
nRBC: 0 % (ref 0.0–0.2)

## 2019-08-25 LAB — COMPREHENSIVE METABOLIC PANEL
ALT: 131 U/L — ABNORMAL HIGH (ref 0–44)
AST: 43 U/L — ABNORMAL HIGH (ref 15–41)
Albumin: 2.6 g/dL — ABNORMAL LOW (ref 3.5–5.0)
Alkaline Phosphatase: 248 U/L — ABNORMAL HIGH (ref 38–126)
Anion gap: 14 (ref 5–15)
BUN: 48 mg/dL — ABNORMAL HIGH (ref 6–20)
CO2: 23 mmol/L (ref 22–32)
Calcium: 8.3 mg/dL — ABNORMAL LOW (ref 8.9–10.3)
Chloride: 95 mmol/L — ABNORMAL LOW (ref 98–111)
Creatinine, Ser: 5.8 mg/dL — ABNORMAL HIGH (ref 0.44–1.00)
GFR calc Af Amer: 9 mL/min — ABNORMAL LOW (ref >60)
GFR calc non Af Amer: 8 mL/min — ABNORMAL LOW (ref >60)
Glucose, Bld: 97 mg/dL (ref 70–99)
Potassium: 4.6 mmol/L (ref 3.5–5.1)
Sodium: 132 mmol/L — ABNORMAL LOW (ref 135–145)
Total Bilirubin: 1.6 mg/dL — ABNORMAL HIGH (ref 0.3–1.2)
Total Protein: 6 g/dL — ABNORMAL LOW (ref 6.5–8.1)

## 2019-08-25 LAB — GLUCOSE, CAPILLARY
Glucose-Capillary: 92 mg/dL (ref 70–99)
Glucose-Capillary: 79 mg/dL (ref 70–99)

## 2019-08-25 LAB — PHOSPHORUS: Phosphorus: 5 mg/dL — ABNORMAL HIGH (ref 2.5–4.6)

## 2019-08-25 MED ORDER — LEVOTHYROXINE SODIUM 25 MCG PO TABS: 25.0000 ug | ORAL_TABLET | Freq: Every day | ORAL | 0 refills | Status: DC

## 2019-08-25 MED ORDER — LOVASTATIN 40 MG PO TABS: 40.0000 mg | ORAL_TABLET | Freq: Every day | ORAL | 0 refills | Status: DC

## 2019-08-25 MED ORDER — HEPARIN SODIUM (PORCINE) 1000 UNIT/ML DIALYSIS: 20.0000 [IU]/kg | INTRAMUSCULAR | Status: DC | PRN

## 2019-08-25 MED ORDER — MIDODRINE HCL 5 MG PO TABS
ORAL_TABLET | ORAL | Status: AC
  Administered 2019-08-25: 5 mg via ORAL
  Filled 2019-08-25: qty 1

## 2019-08-25 MED ORDER — HEPARIN SODIUM (PORCINE) 1000 UNIT/ML IJ SOLN
INTRAMUSCULAR | Status: AC
  Filled 2019-08-25: qty 4

## 2019-08-25 MED ORDER — INSULIN ASPART PROT & ASPART (70-30 MIX) 100 UNIT/ML ~~LOC~~ SUSP: 10.0000 [IU] | Freq: Two times a day (BID) | SUBCUTANEOUS | 0 refills | Status: DC

## 2019-08-25 MED ORDER — MIDODRINE HCL 5 MG PO TABS: 5.0000 mg | ORAL_TABLET | Freq: Three times a day (TID) | ORAL | 0 refills | Status: DC

## 2019-08-25 MED ORDER — RENA-VITE PO TABS: 1.0000 | ORAL_TABLET | Freq: Every day | ORAL | 0 refills | Status: AC

## 2019-08-25 MED ORDER — GABAPENTIN 100 MG PO CAPS: 100.0000 mg | ORAL_CAPSULE | Freq: Two times a day (BID) | ORAL | 0 refills | Status: DC

## 2019-08-25 MED ORDER — MIDODRINE HCL 10 MG PO TABS: 10.0000 mg | ORAL_TABLET | ORAL | 0 refills | Status: DC

## 2019-08-25 MED ORDER — PANTOPRAZOLE SODIUM 40 MG PO TBEC: 40.0000 mg | DELAYED_RELEASE_TABLET | Freq: Every day | ORAL | 0 refills | Status: DC

## 2019-08-25 MED ORDER — CARVEDILOL 3.125 MG PO TABS: 3.1250 mg | ORAL_TABLET | Freq: Two times a day (BID) | ORAL | 0 refills | Status: DC

## 2019-08-25 MED ORDER — DARBEPOETIN ALFA 100 MCG/0.5ML IJ SOSY
PREFILLED_SYRINGE | INTRAMUSCULAR | Status: AC
  Administered 2019-08-25: 100 ug via INTRAVENOUS
  Filled 2019-08-25: qty 0.5

## 2019-08-25 MED FILL — MIDODRINE HCL 5 MG TABLET: 5 | 30 days supply | Qty: 102 | Fill #0

## 2019-08-25 MED FILL — PANTOPRAZOLE SOD DR 40 MG T: 40 | 30 days supply | Qty: 30 | Fill #0

## 2019-08-25 MED FILL — PENTIPS 31G X 8 MM MISC: 31G X 8 MM | 30 days supply | Qty: 100 | Fill #0

## 2019-08-25 MED FILL — CARVEDILOL 3.125 MG TABLET: 3.125 | 30 days supply | Qty: 60 | Fill #0

## 2019-08-25 MED FILL — LEVOTHYROXINE 25 MCG TABLET: 25 | 30 days supply | Qty: 30 | Fill #0

## 2019-08-25 MED FILL — NOVOLOG MIX 70-30 FLEXPEN S: (70-30) 100 | 30 days supply | Qty: 6 | Fill #0

## 2019-08-25 MED FILL — RENA-VITE TABLET: 30 days supply | Qty: 30 | Fill #0

## 2019-08-25 MED FILL — GABAPENTIN 100 MG CAPSULE: 100 | 30 days supply | Qty: 60 | Fill #0

## 2019-08-25 NOTE — Progress Notes (Signed)
PT Cancellation Note  Patient Details Name: MIDGE MOMON MRN: [id number] DOB: 04-20-1971   Cancelled Treatment:    Reason Eval/Treat Not Completed: Patient at procedure or test/unavailable. Pt in HD. PT to re-attempt as time allows.   Lorriane Shire 08/25/2019, 8:29 AM  Lorrin Goodell, PT  Office # [phone] Pager [phone]

## 2019-08-25 NOTE — Procedures (Signed)
I was present at this dialysis session. I have reviewed the session itself and made appropriate changes.   Vital signs in last 24 hours:  Temp:  [98.1 F (36.7 C)-98.4 F (36.9 C)] 98.1 F (36.7 C) (02/16 0557) Pulse Rate:  [65-75] 65 (02/16 0703) Resp:  [16-23] 23 (02/16 0703) BP: (129-149)/(70-86) 133/79 (02/16 0703) SpO2:  [95 %-98 %] 96 % (02/16 0652) Weight:  [80.1 kg] 80.1 kg (02/16 0652) Weight change:  Filed Weights   08/22/19 1115 08/23/19 0608 08/25/19 0652  Weight: 78.3 kg 80 kg 80.1 kg    Recent Labs  Lab 08/25/19 0358  NA 132*  K 4.6  CL 95*  CO2 23  GLUCOSE 97  BUN 48*  CREATININE 5.80*  CALCIUM 8.3*  PHOS 5.0*    Recent Labs  Lab 08/22/19 0639 08/23/19 0222 08/24/19 1227  WBC 10.9* 10.1 8.7  HGB 8.3* 7.9* 7.7*  HCT 27.4* 26.5* 25.1*  MCV 101.9* 100.4* 100.8*  PLT 131* 134* 137*    Scheduled Meds: . carvedilol  3.125 mg Oral BID WC  . Chlorhexidine Gluconate Cloth  6 each Topical Q0600  . darbepoetin (ARANESP) injection - DIALYSIS  100 mcg Intravenous Q Tue-HD  . ferric citrate  420 mg Oral TID WC  . gabapentin  100 mg Oral BID  . heparin  5,000 Units Subcutaneous Q8H  . insulin aspart  0-9 Units Subcutaneous TID WC  . levothyroxine  25 mcg Oral Daily  . midodrine  10 mg Oral Q T,Th,Sa-HD  . midodrine  5 mg Oral TID WC  . multivitamin  1 tablet Oral QHS  . pantoprazole  40 mg Oral QHS  . polyethylene glycol  17 g Oral Daily  . traZODone  50 mg Oral QHS   Continuous Infusions: . ferric gluconate (FERRLECIT/NULECIT) IV     PRN Meds:.acetaminophen **OR** acetaminophen (TYLENOL) oral liquid 160 mg/5 mL **OR** acetaminophen, aspirin-acetaminophen-caffeine, docusate sodium, heparin, heparin, labetalol, ondansetron (ZOFRAN) IV, oxyCODONE, oxyCODONE-acetaminophen, promethazine    Assessment and plan: 1. ESRD- tolerating HD well and is set up to begin outpatient HD at [facility] tomorrow. 2. Anemia- s/p transfusions and on IV  iron/ESA 3. HTN- stable 4. 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 Disposition- for discharge today after HD  Donetta Potts,  MD 08/25/2019, 8:21 AM

## 2019-08-25 NOTE — Progress Notes (Signed)
Records faxed to OP HD clinic/[facility] and clinic notified of patient's discharge and Wednesday start date.  Alphonzo Cruise, [place] Renal Navigator [phone]

## 2019-08-25 NOTE — Progress Notes (Signed)
Nutrition Follow-up  DOCUMENTATION CODES:   Obesity unspecified  INTERVENTION:   -D/c Nepro Shake po daily, each supplement provides 425 kcal and 19 grams protein -D/c 30 ml Prostat TID, each supplement provides 100 kcals and 15 grams protein -Continue Magic cup TID with meals, each supplement provides 290 kcal and 9 grams of protein -Continue renal MVI daily -Provided "Food Pyramid for Healthy Eating with Kidney Disease" handout; attached to AVS/ discharge summary  NUTRITION DIAGNOSIS:   Inadequate oral intake related to nausea, poor appetite as evidenced by per patient/family report, meal completion < 50%.  Ongoing  GOAL:   Patient will meet greater than or equal to 90% of their needs  Progressing   MONITOR:   PO intake, Supplement acceptance, Labs, Weight trends, Skin, I & O's  REASON FOR ASSESSMENT:   Consult Poor PO  ASSESSMENT:   Patient is a 49 year old female with a medical history significant for CKD 5, hyperlipidemia, type 2 diabetes who presented to [facility], ED for strokelike symptoms.  CT/MRI negative.  She started having worsening renal function.  The nephrology team was consulted and she underwent tunneled HD catheterization on 2/2, renal biopsy and HD on 2/3.  CT of the abdomen and pelvis on 2/5 showed possible pericardial effusion but follow-up echo showed mild pericardial effusion without tamponade. 2/8 had L AV fistula by vasc surg.  She is uninsured and waiting for discharge once outpatient hemodialysis has been set up.  2/2- s/p tunneled HD cath placement 2/3- s/p US guided biopsy ofinferior pole of the left kidney, HD initiated 2/5- CT of abdomen and pelvis reveals mild pericardial effusion 2/8- s/p Left brachiocephalic AV fistula  Reviewed I/O's: +100 ml x 24 hours and -2.2 L since 08/11/19  Pr in HD suite at time of visit.   Intake has improved since last visit. Noted meal completion 40-100%. Pt is refusing Nepro and Prostat supplements; RD  will discontinue due to poor acceptance and increased oral intake.  Per renal navigator notes, outpatient HD placement has been obtained. Pt will likely discharge home later today after HD (first outpatient treatment scheduled for 08/26/19).  Labs reviewed: Na: 132, Phos: 5.2, CBGS: 98-122 (inpatient orders for glycemic control are 0-9 units insulin aspart TID with meals).   Diet Order:   Diet Order            Diet renal/carb modified with fluid restriction Fluid restriction: 1200 mL Fluid; Room service appropriate? Yes; Fluid consistency: Thin  Diet effective now              EDUCATION NEEDS:   No education needs have been identified at this time  Skin:  Skin Assessment: Skin Integrity Issues: Skin Integrity Issues:: Incisions Incisions: lt flank, lt arm  Last BM:  08/24/19  Height:   Ht Readings from Last 1 Encounters:  08/08/19 5\' 3"  (1.6 m)    Weight:   Wt Readings from Last 1 Encounters:  08/25/19 80.1 kg    Ideal Body Weight:  52.3 kg  BMI:  Body mass index is 31.28 kg/m.  Estimated Nutritional Needs:   Kcal:  1650-1850  Protein:  80-95 grams  Fluid:  1000 ml + UOP    Loistine Chance, RD, LDN, CDCES Registered Dietitian II Certified Diabetes Care and Education Specialist Please refer to [facility] for RD and/or RD on-call/weekend/after hours pager

## 2019-08-25 NOTE — Progress Notes (Signed)
Patient educated on discharge papers, IV taken out. Patient being picked up by medical transport.

## 2019-08-25 NOTE — Care Management (Signed)
Nurse called concerned about discharge and patient's mobility. Discussed with patient at bedside. Patient refusing SNF. Patient agreeable to [facility] transport to address on face sheet. States her roommate  is there. Tried to call patient's daughter [name] [phone] no answer and mail box full. Patient aware and will call [name].   Patient alert and oriented x 4. Concerns discussed. Patient wants to discharge to home address with roommate via [place].   [facility] called , paperwork left at nurses station with nurse.   Magdalen Spatz RN

## 2019-08-25 NOTE — Discharge Summary (Signed)
Physician Discharge Summary  Melanie Hall EXB:[id number] DOB: 1971-03-12 DOA: 08/08/2019  PCP: Sandria Man[facility] [place] date: 07/11/7251 Discharge date: 08/25/2019  Admitted From: home Disposition: home  Recommendations for Outpatient Follow-up:  1. Follow up with PCP in 1-2 weeks 2. HD at [facility] M/W/F at 3 PM, be present at 2 PM 08/26/19 for first session 3. Please obtain BMP/CBC in one week 4. Please follow up on the following pending results:  Home Health:no Equipment/Devices:none  Discharge Condition:stable CODE STATUS:full Diet recommendation: renal   Brief/Interim Summary: 49 year old female with a medical history significant for CKD 5 which has progressed to end-stage renal disease needing hemodialysis, hyperlipidemia, type 2 diabetes who presented to [facility], ED for strokelike symptoms. CT/MRI negative. She started having worsening renal function. The nephrology team was consulted and she underwent tunneled HD catheterization on 2/2,renal biopsy and HD on 2/3.CT of the abdomen and pelvis on 2/5 showedpossible pericardial effusion but follow-up echo showed mild pericardial effusion without tamponade.2/8 had L AV fistula by vasc surg.She is uninsured and waiting for discharge once outpatient hemodialysis is arranged. She tolerated dialysis well inpatient and outpatient dialysis was able to be arranged at [facility] starting 08/26/19 with M/W/F schedule.   Discharge Diagnoses:  Principal Problem:   Acute on chronic renal failure (HCC) Active Problems:   Type 2 diabetes with nephropathy (HCC)   HYPERCHOLESTEROLEMIA   GERD (gastroesophageal reflux disease)   MGUS (monoclonal gammopathy of unknown significance)   Accelerated hypertension   Right arm weakness   Anemia in chronic kidney disease  Discharge Instructions  Discharge Instructions    Increase activity slowly   Complete by: As directed      Allergies as of 08/25/2019   No  Known Allergies     Medication List    STOP taking these medications   amLODipine 5 MG tablet Commonly known as: NORVASC   hydrochlorothiazide 25 MG tablet Commonly known as: HYDRODIURIL     TAKE these medications   acetaminophen 325 MG tablet Commonly known as: TYLENOL Take 2 tablets (650 mg total) by mouth every 6 (six) hours as needed for mild pain or headache (or Fever >/= 101).   carvedilol 3.125 MG tablet Commonly known as: COREG Take 3.125 mg by mouth.   epoetin alfa 3000 UNIT/ML injection Commonly known as: EPOGEN Inject 3,000 Units into the skin every 14 (fourteen) days.   gabapentin 100 MG capsule Commonly known as: NEURONTIN Take 1 capsule (100 mg total) by mouth 2 (two) times daily.   insulin aspart protamine- aspart (70-30) 100 UNIT/ML injection Commonly known as: NOVOLOG MIX 70/30 Inject 15 Units into the skin 3 (three) times daily before meals.   levothyroxine 25 MCG tablet Commonly known as: SYNTHROID Take 25 mcg by mouth daily.   lovastatin 40 MG tablet Commonly known as: MEVACOR Take 40 mg by mouth at bedtime.   midodrine 5 MG tablet Commonly known as: PROAMATINE Take 1 tablet (5 mg total) by mouth 3 (three) times daily with meals.   midodrine 10 MG tablet Commonly known as: PROAMATINE Take 1 tablet (10 mg total) by mouth Every Tuesday,Thursday,and Saturday with dialysis. Start taking on: August 27, 2019   multivitamin Tabs tablet Take 1 tablet by mouth at bedtime.   pantoprazole 40 MG tablet Commonly known as: Protonix Take 1 tablet (40 mg total) by mouth daily.   sodium bicarbonate 650 MG tablet Take 650 mg by mouth 2 (two) times daily.      Follow-up  Information    Dialysis Follow up.   Why: Monday, Wednesday, Friday at 3 PM, for first appointment on 08/26/19 you need to be there at 2 PM       Health, [facility]. Schedule an appointment as soon as possible for a visit in 1 week(s).   Contact information: [address] [place]  [address] [phone]          No Known Allergies  Consultations:  Nephrology  Procedures/Studies: CT ABDOMEN PELVIS WO CONTRAST  Result Date: 08/14/2019 CLINICAL DATA:  Evaluate for retroperitoneal hematoma. Recent left-sided ultrasound-guided random renal biopsy. EXAM: CT ABDOMEN AND PELVIS WITHOUT CONTRAST TECHNIQUE: Multidetector CT imaging of the abdomen and pelvis was performed following the standard protocol without IV contrast. COMPARISON:  Ultrasound-guided left-sided random renal biopsy-08/12/2019; CT abdomen and pelvis-11/03/2018; 08/26/2018; lumbar spine CT -08/09/2019 FINDINGS: The lack of intravenous contrast limits the ability to evaluate solid abdominal organs. Examination is further degraded secondary to patient's overlying upper extremities and support apparatus Lower chest: Limited visualization of the lower thorax demonstrates small to moderate-sized bilateral pleural effusions with associated bilateral lower lobe atelectasis/collapse with associated air bronchograms, right greater than left. Borderline cardiomegaly. There is diffuse decreased attenuation of the intra cardiac blood pool suggestive of anemia. Interval development of a small moderate-sized pericardial effusion. Dialysis catheter tips terminate within the superior cavoatrial junction. Hepatobiliary: Hepatomegaly with mild nodularity hepatic contour, unchanged. Layering hyperattenuating gallstones are seen within otherwise normal-appearing gallbladder. No discrete gallbladder wall thickening or pericholecystic stranding on this noncontrast examination. Pancreas: Normal noncontrast appearance of the pancreas. Spleen: Normal noncontrast appearance of the spleen. Adrenals/Urinary Tract: Note is made of a small left-sided perinephric hematoma, the sequela of recent ultrasound-guided random renal biopsy. While exact measurements are difficult secondary to the crescentic nature of the hematoma, and is  estimated to measure approximately 3.6 x 3.6 x 3.0 cm (coronal image 59, series 15; axial image 50, series 12). Otherwise, normal noncontrast appearance of the bilateral kidneys. No renal stones. No renal stones are seen along expected course of either ureter or the urinary bladder. Normal appearance of the urinary bladder given degree distention. No urinary obstruction or perinephric stranding. Normal noncontrast appearance the bilateral adrenal glands. Stomach/Bowel: Moderate colonic stool burden without evidence of enteric obstruction. Normal noncontrast appearance of the terminal ileum and the retrocecal appendix. No hiatal hernia. No pneumoperitoneum, pneumatosis or portal venous gas. Vascular/Lymphatic: Crescentic atherosclerotic plaque most conspicuous within the splenic artery, SMA and IMA as well as the bilateral superficial femoral arteries. Normal caliber the abdominal aorta No bulky retroperitoneal, mesenteric, pelvic or inguinal lymphadenopathy on this noncontrast examination. Reproductive: Normal noncontrast appearance of the pelvic organs. There is a small amount of free fluid in the pelvic cul-de-sac. Other: Worsening diffuse body wall anasarca. Musculoskeletal: Moderate (approximately 40%) compression deformity involving the L1 vertebral body without associated retropulsion, grossly unchanged compared to lumbar spine MRI performed 08/09/2019. Ex vacuo disc phenomenon involving the T12-L1 and L1-L2 intervertebral disc spaces. IMPRESSION: 1. Small (approximately 3.6 cm) crescentic perinephric hematoma following recent ultrasound-guided left renal biopsy. 2. Findings most suggestive of worsening congestive heart failure, now with small to moderate-sized bilateral effusions, moderate-sized pericardial effusion and diffuse body wall anasarca. Further evaluation with cardiac echo could be performed as clinically indicated. 3. Worsening bilateral lobe atelectasis/collapse, right greater than left, likely  atelectasis. 4. Moderate (approximately 40%) compression deformity involving the L1 vertebral body, unchanged compared to lumbar spine CT performed 08/09/2019. 5. Cholelithiasis without evidence of cholecystitis. Electronically Signed  By: Sandi Mariscal M.D.   On: 08/14/2019 08:43   DG Abd 1 View  Result Date: 08/11/2019 CLINICAL DATA:  Acute abdominal pain EXAM: ABDOMEN - 1 VIEW COMPARISON:  Abdominal CT 11/03/2018 FINDINGS: Normal bowel gas pattern. No concerning mass effect or gas collection. Splenic atherosclerotic calcification. IMPRESSION: Normal bowel gas pattern. Electronically Signed   By: Monte Fantasia M.D.   On: 08/11/2019 04:35   CT Head Wo Contrast  Result Date: 08/08/2019 CLINICAL DATA:  Headache 2 days.  Hypertension.  Possible stroke. EXAM: CT HEAD WITHOUT CONTRAST TECHNIQUE: Contiguous axial images were obtained from the base of the skull through the vertex without intravenous contrast. COMPARISON:  None. FINDINGS: Brain: Ventricles, cisterns and other CSF spaces are normal. There is no mass, mass effect, shift of midline structures or acute hemorrhage. No evidence of acute infarction. Vascular: No hyperdense vessel or unexpected calcification. Skull: Normal. Negative for fracture or focal lesion. Sinuses/Orbits: No acute finding. Other: None. IMPRESSION: No acute findings. Electronically Signed   By: Marin Olp M.D.   On: 08/08/2019 17:31   CT CERVICAL SPINE WO CONTRAST  Result Date: 08/13/2019 CLINICAL DATA:  Spinal stenosis. EXAM: CT CERVICAL SPINE WITHOUT CONTRAST TECHNIQUE: Multidetector CT imaging of the cervical spine was performed without intravenous contrast. Multiplanar CT image reconstructions were also generated. COMPARISON:  None. FINDINGS: Alignment: Normal Skull base and vertebrae: Negative for fracture or mass. Soft tissues and spinal canal: Spinal canal normal in size. No significant spinal stenosis. Disc levels: Disc spaces well maintained without significant disc  space narrowing. Small central disc protrusions at C3-4 and C4-5 without significant stenosis of the canal. No significant foraminal encroachment. Upper chest: Bilateral pleural effusions right greater than left. Visualized lung in the apices is clear bilaterally. Other: None IMPRESSION: Negative for spinal stenosis. No acute abnormality in the cervical spine. Mild degenerative changes. Bilateral pleural effusions, right greater than left. Electronically Signed   By: Franchot Gallo M.D.   On: 08/13/2019 17:20   CT LUMBAR SPINE WO CONTRAST  Result Date: 08/23/2019 CLINICAL DATA:  Fecal incontinence. Bilateral lower extremity weakness. EXAM: CT LUMBAR SPINE WITHOUT CONTRAST TECHNIQUE: Multidetector CT imaging of the lumbar spine was performed without intravenous contrast administration. Multiplanar CT image reconstructions were also generated. COMPARISON:  08/09/2019 lumbar spine CT. 08/13/2019 CT abdomen and pelvis. FINDINGS: Segmentation: 5 lumbar type vertebrae. Alignment: Normal. Vertebrae: Unchanged L1 compression fracture with 60% vertebral body height loss, heterogeneous lucency and gas anteriorly and centrally in the vertebral body, and surrounding sclerosis most notable posteriorly. No retropulsion or evidence of extraosseous tumor. No new fracture. Background osteopenia. Paraspinal and other soft tissues: Partially visualized small left perinephric hematoma without enlargement from the previous abdominal CT. Nonobstructing punctate renal calculi. Partially visualized small volume ascites. Age advanced atherosclerosis. Partially visualized gallstones. Disc levels: Unchanged appearance of mild lumbar spondylosis and mild lower lumbar facet hypertrophy compared to the prior lumbar CT without evidence of compressive stenosis. IMPRESSION: 1. Unchanged L1 compression fracture. 2. No acute osseous abnormality in the lumbar spine. 3. Persistent small left perinephric hematoma. Electronically Signed   By: Logan Bores M.D.   On: 08/23/2019 13:07   CT LUMBAR SPINE WO CONTRAST  Result Date: 08/09/2019 CLINICAL DATA:  49 year old female with increasing low back pain for several days. Monoclonal gammopathy of unknown significance. Osteoporosis. EXAM: CT LUMBAR SPINE WITHOUT CONTRAST TECHNIQUE: Multidetector CT imaging of the lumbar spine was performed without intravenous contrast administration. Multiplanar CT image reconstructions were also generated. COMPARISON:  Radiographic  skeletal survey 07/23/2019. CT Abdomen and Pelvis 11/03/2018. FINDINGS: Segmentation: Normal. Alignment: Stable from the skeletal survey earlier this month. Relatively maintained lumbar lordosis. No spondylolisthesis. Vertebrae: Background diffuse osteopenia. Visible lower thoracic levels appear intact. Moderate to severe L1 compression fracture with anterior wedging stable from the radiographs earlier this month. Prominent gas within the fractured vertebral body and the adjacent L1-L2 disc. No significant retropulsion of bone. L1 posterior elements appear intact. Remaining lumbar levels appear intact. Visible sacrum and SI joints appear intact. Paraspinal and other soft tissues: Chronic cholelithiasis with a hazy appearance of gallbladder appears similar to the 2020 CT Abdomen and Pelvis. Pleural fluid at the costophrenic angles is also redemonstrated. There is a small to moderate volume of free fluid in the visible pelvis, cul-de-sac with simple fluid density (series 4, image 144). Superimposed extensive abdominal and pelvic visceral artery calcified atherosclerosis. Disc levels: Mild for age lumbar spine degeneration. IMPRESSION: 1. L1 compression fracture is stable from the skeletal survey earlier this month. 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 And there is posttraumatic or degenerative gas in the adjacent L1-L2 disc. No significant retropulsion of bone or complicating features. 2. Osteopenia. No  other acute osseous abnormality in the lumbar spine. 3. Nonspecific small volume of ascites in the visible pelvis. Pleural fluid visible in the costophrenic angles. 4. Chronic cholelithiasis. 5. Extensive calcified atherosclerosis. Electronically Signed   By: Genevie Ann M.D.   On: 08/09/2019 18:31   MR BRAIN WO CONTRAST  Result Date: 08/09/2019 CLINICAL DATA:  Initial evaluation for acute encephalopathy.  TIA. EXAM: MRI HEAD WITHOUT CONTRAST TECHNIQUE: Multiplanar, multiecho pulse sequences of the brain and surrounding structures were obtained without intravenous contrast. COMPARISON:  Prior CT from 08/08/2019. FINDINGS: Brain: Examination moderately degraded by motion artifact. Cerebral volume within normal limits for age. Patchy T2/FLAIR hyperintensity within the pons, most like related chronic microvascular ischemic disease, mild in nature. No abnormal foci of restricted diffusion to suggest acute or subacute ischemia. Gray-white matter differentiation maintained. No encephalomalacia to suggest chronic cortical infarction. No foci of susceptibility artifact to suggest acute or chronic intracranial hemorrhage. No mass lesion, midline shift or mass effect. No hydrocephalus. No extra-axial fluid collection. Pituitary gland suprasellar region normal. Midline structures intact. Vascular: Major intracranial vascular flow voids are maintained. Skull and upper cervical spine: Craniocervical junction within normal limits. Upper cervical spine unremarkable. Bone marrow signal intensity within normal limits. No scalp soft tissue abnormality. Sinuses/Orbits: Patient status post bilateral ocular lens replacement. Globes and orbital soft tissues demonstrate no acute finding. Paranasal sinuses are largely clear. No mastoid effusion. Inner ear structures grossly normal. Other: None. IMPRESSION: 1. No acute intracranial abnormality. 2. Patchy T2/FLAIR hyperintensity involving the pons, most likely related to chronic small vessel  ischemic disease, mild for age. Electronically Signed   By: Jeannine Boga M.D.   On: 08/09/2019 06:35   US RENAL  Result Date: 08/09/2019 CLINICAL DATA:  Acute kidney injury EXAM: RENAL / URINARY TRACT ULTRASOUND COMPLETE COMPARISON:  03/05/2019 FINDINGS: Right Kidney: Renal measurements: 10.6 x 5.1 x 5 cm = volume: 136 mL . Echogenicity within normal limits. No mass or hydronephrosis visualized. Left Kidney: Renal measurements: 10.9 x 5.5 x 5.6 = volume: 175 mL. Echogenicity within normal limits. No mass or hydronephrosis visualized. Bladder: Not visualized Other: None. IMPRESSION: 1. No acute abnormality.  No hydronephrosis. 2. The bladder was not visualized on this exam. Electronically Signed   By: Constance Holster M.D.   On: 08/09/2019 22:49  IR Fluoro Guide CV Line Right  Result Date: 08/11/2019 INDICATION: 49 year old with acute on chronic renal failure. Patient needs hemodialysis. EXAM: FLUOROSCOPIC AND ULTRASOUND GUIDED PLACEMENT OF A TUNNELED DIALYSIS CATHETER Physician: Stephan Minister. Anselm Pancoast, MD MEDICATIONS: Ancef 2 g; The antibiotic was administered within an appropriate time interval prior to skin puncture. ANESTHESIA/SEDATION: Versed 1.0 mg IV; Fentanyl 50 mcg IV; Moderate Sedation Time:  27 minutes The patient was continuously monitored during the procedure by the interventional radiology nurse under my direct supervision. FLUOROSCOPY TIME:  Fluoroscopy Time: 24 seconds, 1 mGy COMPLICATIONS: None immediate. PROCEDURE: The procedure was explained to the patient. The risks and benefits of the procedure were discussed and the patient's questions were addressed. Informed consent was obtained from the patient. The patient was placed supine on the interventional table. Ultrasound confirmed a patent right internal jugular vein. Ultrasound images were obtained for documentation. The right neck and chest was prepped and draped in a sterile fashion. The right neck was anesthetized with 1% lidocaine.  Maximal barrier sterile technique was utilized including caps, mask, sterile gowns, sterile gloves, sterile drape, hand hygiene and skin antiseptic. A small incision was made with #11 blade scalpel. A 21 gauge needle directed into the right internal jugular vein with ultrasound guidance. A micropuncture dilator set was placed. A 19 cm tip to cuff Palindrome catheter was selected. The skin below the right clavicle was anesthetized and a small incision was made with an #11 blade scalpel. A subcutaneous tunnel was formed to the vein dermatotomy site. The catheter was brought through the tunnel. The vein dermatotomy site was dilated to accommodate a peel-away sheath. The catheter was placed through the peel-away sheath and directed into the central venous structures. The tip of the catheter was placed at the SVC and right atrium junction with fluoroscopy. Fluoroscopic images were obtained for documentation. Both lumens were found to aspirate and flush well. The proper amount of heparin was flushed in both lumens. The vein dermatotomy site was closed using a single layer of absorbable suture and Dermabond. Gel-Foam placed in the subcutaneous tract. The catheter was secured to the skin using Prolene suture. IMPRESSION: Successful placement of a right jugular tunneled dialysis catheter using ultrasound and fluoroscopic guidance. Electronically Signed   By: Markus Daft M.D.   On: 08/11/2019 13:10   IR US Guide Vasc Access Right  Result Date: 08/11/2019 INDICATION: 49 year old with acute on chronic renal failure. Patient needs hemodialysis. EXAM: FLUOROSCOPIC AND ULTRASOUND GUIDED PLACEMENT OF A TUNNELED DIALYSIS CATHETER Physician: Stephan Minister. Anselm Pancoast, MD MEDICATIONS: Ancef 2 g; The antibiotic was administered within an appropriate time interval prior to skin puncture. ANESTHESIA/SEDATION: Versed 1.0 mg IV; Fentanyl 50 mcg IV; Moderate Sedation Time:  27 minutes The patient was continuously monitored during the procedure by the  interventional radiology nurse under my direct supervision. FLUOROSCOPY TIME:  Fluoroscopy Time: 24 seconds, 1 mGy COMPLICATIONS: None immediate. PROCEDURE: The procedure was explained to the patient. The risks and benefits of the procedure were discussed and the patient's questions were addressed. Informed consent was obtained from the patient. The patient was placed supine on the interventional table. Ultrasound confirmed a patent right internal jugular vein. Ultrasound images were obtained for documentation. The right neck and chest was prepped and draped in a sterile fashion. The right neck was anesthetized with 1% lidocaine. Maximal barrier sterile technique was utilized including caps, mask, sterile gowns, sterile gloves, sterile drape, hand hygiene and skin antiseptic. A small incision was made with #11 blade scalpel. A  21 gauge needle directed into the right internal jugular vein with ultrasound guidance. A micropuncture dilator set was placed. A 19 cm tip to cuff Palindrome catheter was selected. The skin below the right clavicle was anesthetized and a small incision was made with an #11 blade scalpel. A subcutaneous tunnel was formed to the vein dermatotomy site. The catheter was brought through the tunnel. The vein dermatotomy site was dilated to accommodate a peel-away sheath. The catheter was placed through the peel-away sheath and directed into the central venous structures. The tip of the catheter was placed at the SVC and right atrium junction with fluoroscopy. Fluoroscopic images were obtained for documentation. Both lumens were found to aspirate and flush well. The proper amount of heparin was flushed in both lumens. The vein dermatotomy site was closed using a single layer of absorbable suture and Dermabond. Gel-Foam placed in the subcutaneous tract. The catheter was secured to the skin using Prolene suture. IMPRESSION: Successful placement of a right jugular tunneled dialysis catheter using  ultrasound and fluoroscopic guidance. Electronically Signed   By: Markus Daft M.D.   On: 08/11/2019 13:10   DG Bone Density  Result Date: 07/30/2019 EXAM: DUAL X-RAY ABSORPTIOMETRY (DXA) FOR BONE MINERAL DENSITY IMPRESSION: Your patient Melanie Hall completed a BMD test on 07/30/2019 using the [place] (software version: 14.10) manufactured by [employer]. The following summarizes the results of our evaluation. Technologist: [facility] PATIENT BIOGRAPHICAL: Name: Demecia, [place] Patient ID: [id number] Birth Date: [birth year] Height: 62.0 in. Gender: Female Exam Date: 07/30/2019 Weight: 148.0 lbs. Indications: Caucasian, Diabetic-insulin dependent, Early Menopause, History of Fracture (Adult), Low Calcium Intake, Post Menopausal, Renal Fractures: Ankle, Knee Treatments: DENSITOMETRY RESULTS: Site      Region    Measured Date Measured Age WHO Classification Young Adult T-score BMD         %Change vs. Previous Significant Change (*) AP Spine L2-L3 07/30/2019 49.5 Osteopenia -1.4 1.034 g/cm2 - - DualFemur Neck Left 07/30/2019 49.5 Osteoporosis -3.4 0.564 g/cm2 - - ASSESSMENT: The BMD measured at Femur Neck Left is 0.564 g/cm2 with a T-score of -3.4. This patient is considered OSTEOPOROTIC according to [facility]) criteria. The scan quality is good. L1 and L4 were excluded due to advanced degenerative changes. World Pharmacologist [facility]) criteria for post-menopausal, Caucasian Women: Normal:       T-score at or above -1 SD Osteopenia:   T-score between -1 and -2.5 SD Osteoporosis: T-score at or below -2.5 SD RECOMMENDATIONS: 1. All patients should optimize calcium and vitamin D intake. 2. Consider FDA-approved medical therapies in postmenopausal women and med aged 46 years and older, based on the following: a. A hip or vertebral (clinical or morphometric) fracture b. T-score < -2.5 at the femoral neck or spine after appropriate evaluation to exclude secondary causes c. Low  bone mass (T-score between -1.0 and -2.5 at the femoral neck or spine) and a 10-year probability of a hip fracture > 3% or a 10-year probability of a major osteoporosis-related fracture > 20% based on the [place]-adapted WHO algorithm d. Clinician judgment and/or patient preferences may indicate treatment for people with 10-year fracture probabilities above or below these levels FOLLOW-UP: People with diagnosed cases of osteoporosis or at high risk for fracture should have regular bone mineral density tests. For patients eligible for Medicare, routine testing is allowed once every 2 years. The testing frequency can be increased to one year for patients who have rapidly progressing disease, those who are  receiving or discontinuing medical therapy to restore bone mass, or have additional risk factors. I have reviewed this report, and agree with the above findings. Mark A. Thornton Papas, M.D. [facility] Electronically Signed   By: Lavonia Dana M.D.   On: 07/30/2019 13:28   DG Chest Portable 1 View  Result Date: 08/08/2019 CLINICAL DATA:  Weakness EXAM: PORTABLE CHEST 1 VIEW COMPARISON:  02/28/2019 FINDINGS: There is a moderate right and small left pleural effusion. Bibasilar airspace disease is noted and favored to represent atelectasis. There is no pneumothorax. The heart size is stable from prior study. There are few linear opacities in the left lower lung zone favored to represent atelectasis or scarring. There is vascular congestion. IMPRESSION: 1. Persistent bilateral pleural effusions, right greater than left. 2. Bibasilar airspace disease favored to represent atelectasis with an infiltrate not entirely excluded. Electronically Signed   By: Constance Holster M.D.   On: 08/08/2019 16:42   ECHOCARDIOGRAM COMPLETE  Result Date: 08/14/2019   ECHOCARDIOGRAM REPORT   Patient Name:   Melanie Hall Date of Exam: 08/14/2019 Medical Rec #:  [id number]     Height:       63.0 in Accession #:    [id number]    Weight:        172.8 lb Date of Birth:  January 08, 1971     BSA:          1.82 m Patient Age:    9 years      BP:           156/82 mmHg Patient Gender: F             HR:           58 bpm. Exam Location:  Inpatient Procedure: 2D Echo STAT ECHO Indications:    Pericardial effusion 423.9 / I31.3  History:        Patient has prior history of Echocardiogram examinations, most                 recent 08/09/2019. Risk Factors:Hypertension and Diabetes.                 Chronic kidney disease.  Sonographer:    Darlina Sicilian RDCS Referring Phys: [id number] [place]  1. Small pericardial effusion, measures up to 66mm adjacent to RV. IVC is fixed/dilated, and there is significant mitral respiratory inflow variation, however no RA/RV collapse is seen to suggest tamponade.  2. Left ventricular ejection fraction, by visual estimation, is 55 to 60%. The left ventricle has normal function. There is moderately increased left ventricular hypertrophy.  3. Global right ventricle has normal systolic function.The right ventricular size is normal. .  4. Left atrial size was normal.  5. Right atrial size was normal.  6. The mitral valve is normal in structure. No evidence of mitral valve regurgitation.  7. The tricuspid valve is normal in structure. Tricuspid valve regurgitation is mild.  8. The aortic valve is tricuspid. Aortic valve regurgitation is not visualized. No evidence of aortic valve stenosis.  9. The pulmonic valve was not well visualized. Pulmonic valve regurgitation is trivial. 10. The inferior vena cava is dilated in size with <50% respiratory variability, suggesting right atrial pressure of 15 mmHg. 11. The tricuspid regurgitant velocity is 2.12 m/s, and with an assumed right atrial pressure of 15 mmHg, the estimated right ventricular systolic pressure is mildly elevated at 32.9 mmHg. FINDINGS  Left Ventricle: Left ventricular ejection fraction, by visual estimation, is 55 to  60%. The left ventricle has normal function. The left  ventricle has no regional wall motion abnormalities. There is moderately increased left ventricular hypertrophy. Right Ventricle: The right ventricular size is normal. No increase in right ventricular wall thickness. Global RV systolic function is has normal systolic function. The tricuspid regurgitant velocity is 2.12 m/s, and with an assumed right atrial pressure  of 15 mmHg, the estimated right ventricular systolic pressure is mildly elevated at 32.9 mmHg. Left Atrium: Left atrial size was normal in size. Right Atrium: Right atrial size was normal in size Pericardium: A small pericardial effusion is present. Small pericardial effusion, measures up to 45mm adjacent to RV. IVC is fixed/dilated, and there is significant mitral respiratory inflow variation, however no RA/RV collapse is seen to suggest tamponade. Mitral Valve: The mitral valve is normal in structure. No evidence of mitral valve regurgitation. Tricuspid Valve: The tricuspid valve is normal in structure. Tricuspid valve regurgitation is mild. Aortic Valve: The aortic valve is tricuspid. Aortic valve regurgitation is not visualized. The aortic valve is structurally normal, with no evidence of sclerosis or stenosis. Pulmonic Valve: The pulmonic valve was not well visualized. Pulmonic valve regurgitation is trivial. Pulmonic regurgitation is trivial. Aorta: The aortic root and ascending aorta are structurally normal, with no evidence of dilitation. Venous: The inferior vena cava is dilated in size with less than 50% respiratory variability, suggesting right atrial pressure of 15 mmHg. IAS/Shunts: The interatrial septum was not well visualized.  LEFT VENTRICLE PLAX 2D LVIDd:         3.70 cm  Diastology LVIDs:         2.70 cm  LV e' lateral:   5.63 cm/s LV PW:         1.00 cm  LV E/e' lateral: 14.1 LV IVS:        1.30 cm  LV e' medial:    5.03 cm/s LVOT diam:     1.70 cm  LV E/e' medial:  15.8 LV SV:         31 ml LV SV Index:   16.42 LVOT Area:     2.27 cm   RIGHT VENTRICLE RV S prime:     9.98 cm/s TAPSE (M-mode): 1.6 cm LEFT ATRIUM             Index       RIGHT ATRIUM           Index LA diam:        3.00 cm 1.65 cm/m  RA Area:     14.30 cm LA Vol (A2C):   39.9 ml 21.96 ml/m RA Volume:   37.40 ml  20.58 ml/m LA Vol (A4C):   39.5 ml 21.73 ml/m LA Biplane Vol: 41.0 ml 22.56 ml/m  AORTIC VALVE LVOT Vmax:   101.00 cm/s LVOT Vmean:  66.900 cm/s LVOT VTI:    0.150 m  AORTA Ao Root diam: 2.20 cm Ao Asc diam:  2.70 cm MITRAL VALVE                        TRICUSPID VALVE MV Area (PHT): 3.65 cm             TR Peak grad:   17.9 mmHg MV PHT:        60.32 msec           TR Vmax:        212.00 cm/s MV Decel Time: 208 msec MV E velocity: 79.40 cm/s 103  cm/s  SHUNTS MV A velocity: 63.70 cm/s 70.3 cm/s Systemic VTI:  0.15 m MV E/A ratio:  1.25       1.5       Systemic Diam: 1.70 cm  Oswaldo Milian MD Electronically signed by Oswaldo Milian MD Signature Date/Time: 08/14/2019/2:58:41 PM    Final    ECHOCARDIOGRAM COMPLETE  Result Date: 08/09/2019   ECHOCARDIOGRAM REPORT   Patient Name:   Melanie Hall Date of Exam: 08/09/2019 Medical Rec #:  [id number]     Height:       63.0 in Accession #:    [id number]    Weight:       145.0 lb Date of Birth:  09-29-1970     BSA:          1.69 m Patient Age:    64 years      BP:           115/70 mmHg Patient Gender: F             HR:           70 bpm. Exam Location:  Inpatient Procedure: 2D Echo Indications:    TIA 435.9  History:        Patient has no prior history of Echocardiogram examinations.                 Risk Factors:Hypertension and Diabetes.  Sonographer:    Jannett Celestine RDCS (AE) Referring Phys: [place]  1. Left ventricular ejection fraction, by visual estimation, is 65 to 70%. The left ventricle has hyperdynamic function. There is no left ventricular hypertrophy.  2. Left ventricular diastolic parameters are indeterminate.  3. The left ventricle has no regional wall motion abnormalities.  4.  Global right ventricle has normal systolic function.The right ventricular size is normal. No increase in right ventricular wall thickness.  5. Left atrial size was mildly dilated.  6. Right atrial size was normal.  7. The mitral valve is normal in structure. Trivial mitral valve regurgitation. No evidence of mitral stenosis.  8. The tricuspid valve is normal in structure.  9. The tricuspid valve is normal in structure. Tricuspid valve regurgitation is not demonstrated. 10. The aortic valve has an indeterminant number of cusps. Aortic valve regurgitation is not visualized. No evidence of aortic valve sclerosis or stenosis. 11. The pulmonic valve was not well visualized. Pulmonic valve regurgitation is not visualized. 12. The inferior vena cava is dilated in size with <50% respiratory variability, suggesting right atrial pressure of 15 mmHg. FINDINGS  Left Ventricle: Left ventricular ejection fraction, by visual estimation, is 65 to 70%. The left ventricle has hyperdynamic function. The left ventricle has no regional wall motion abnormalities. There is no left ventricular hypertrophy. Left ventricular diastolic parameters are indeterminate. Right Ventricle: The right ventricular size is normal. No increase in right ventricular wall thickness. Global RV systolic function is has normal systolic function. Left Atrium: Left atrial size was mildly dilated. Right Atrium: Right atrial size was normal in size Pericardium: There is no evidence of pericardial effusion. Mitral Valve: The mitral valve is normal in structure. Trivial mitral valve regurgitation. No evidence of mitral valve stenosis by observation. Tricuspid Valve: The tricuspid valve is normal in structure. Tricuspid valve regurgitation is not demonstrated. Aortic Valve: The aortic valve has an indeterminant number of cusps. Aortic valve regurgitation is not visualized. The aortic valve is structurally normal, with no evidence of sclerosis or stenosis. Aortic valve  mean gradient  measures 3.2 mmHg. Aortic valve peak gradient measures 7.1 mmHg. Aortic valve area, by VTI measures 3.15 cm. Pulmonic Valve: The pulmonic valve was not well visualized. Pulmonic valve regurgitation is not visualized. Pulmonic regurgitation is not visualized. No evidence of pulmonic stenosis. Aorta: The aortic root is normal in size and structure. Venous: The inferior vena cava is dilated in size with less than 50% respiratory variability, suggesting right atrial pressure of 15 mmHg. IAS/Shunts: No atrial level shunt detected by color flow Doppler.  LEFT VENTRICLE PLAX 2D LVIDd:         4.40 cm  Diastology LVIDs:         2.60 cm  LV e' lateral:   6.42 cm/s LV PW:         1.00 cm  LV E/e' lateral: 13.1 LV IVS:        0.70 cm  LV e' medial:    6.74 cm/s LVOT diam:     2.10 cm  LV E/e' medial:  12.5 LV SV:         63 ml LV SV Index:   36.60 LVOT Area:     3.46 cm  RIGHT VENTRICLE RV S prime:     10.90 cm/s TAPSE (M-mode): 2.1 cm LEFT ATRIUM             Index       RIGHT ATRIUM           Index LA diam:        3.50 cm 2.08 cm/m  RA Area:     15.40 cm LA Vol (A2C):   52.3 ml 31.01 ml/m RA Volume:   38.50 ml  22.83 ml/m LA Vol (A4C):   48.6 ml 28.81 ml/m LA Biplane Vol: 50.4 ml 29.88 ml/m  AORTIC VALVE AV Area (Vmax):    2.73 cm AV Area (Vmean):   3.16 cm AV Area (VTI):     3.15 cm AV Vmax:           133.02 cm/s AV Vmean:          82.486 cm/s AV VTI:            0.227 m AV Peak Grad:      7.1 mmHg AV Mean Grad:      3.2 mmHg LVOT Vmax:         105.00 cm/s LVOT Vmean:        75.300 cm/s LVOT VTI:          0.206 m LVOT/AV VTI ratio: 0.91  AORTA Ao Root diam: 2.70 cm MITRAL VALVE MV Area (PHT): 2.80 cm             SHUNTS MV PHT:        78.59 msec           Systemic VTI:  0.21 m MV Decel Time: 271 msec             Systemic Diam: 2.10 cm MV E velocity: 84.00 cm/s 103 cm/s MV A velocity: 58.70 cm/s 70.3 cm/s MV E/A ratio:  1.43       1.5  Carlyle Dolly MD Electronically signed by Carlyle Dolly MD  Signature Date/Time: 08/09/2019/1:42:30 PM    Final    US BIOPSY (KIDNEY)  Result Date: 08/12/2019 INDICATION: Acute on chronic renal insufficiency. Please perform renal biopsy for tissue diagnostic purposes. EXAM: ULTRASOUND GUIDED RENAL BIOPSY COMPARISON:  None. MEDICATIONS: None. ANESTHESIA/SEDATION: Fentanyl 12.5 mcg IV; Versed 0.5 mg IV Total Moderate Sedation time:  10 minutes; The patient was continuously monitored during the procedure by the interventional radiology nurse under my direct supervision. COMPLICATIONS: SIR Level A - No therapy, no consequence. Procedure complicated by development of a small, non enlarging, asymptomatic left-sided perinephric hematoma. Patient complained of left upper extremity numbness prior to the initiation of the procedure however this was felt to be attributable to positioning with her arms held above her head. The symptoms were noted to persist following the procedure and as such, this was relayed to providing hospitalist, Dr. Doristine Bosworth, for further evaluation. PROCEDURE: Informed written consent was obtained from the patient after a discussion of the risks, benefits and alternatives to treatment. The patient understands and consents the procedure. A timeout was performed prior to the initiation of the procedure. Ultrasound scanning was performed of the bilateral flanks. The inferior pole of the left kidney was selected for biopsy due to location and sonographic window. The procedure was planned. The operative site was prepped and draped in the usual sterile fashion. The overlying soft tissues were anesthetized with 1% lidocaine with epinephrine. A 17 gauge core needle biopsy device was advanced into the inferior cortex of the left kidney and 3 core biopsies were obtained under direct ultrasound guidance. Images were saved for documentation purposes. The biopsy device was removed and hemostasis was obtained with manual compression. Post procedural scanning demonstrated a  small non enlarging asymptomatic left-sided perinephric hematoma. A dressing was placed. The patient tolerated the procedure well without immediate post procedural complication. IMPRESSION: Technically successful ultrasound guided left renal biopsy. Electronically Signed   By: Sandi Mariscal M.D.   On: 08/12/2019 09:13   VAS [place] UPPER EXT VEIN MAPPING (PRE-OP AVF)  Result Date: 08/13/2019 UPPER EXTREMITY VEIN MAPPING  Indications: History of PAD; patient is pre-operative for bypass. Comparison Study: no prior Performing Technologist: Abram Sander RVS  Examination Guidelines: A complete evaluation includes B-mode imaging, spectral Doppler, color Doppler, and power Doppler as needed of all accessible portions of each vessel. Bilateral testing is considered an integral part of a complete examination. Limited examinations for reoccurring indications may be performed as noted. +-----------------+-------------+----------+--------+ Right Cephalic   Diameter (cm)Depth (cm)Findings +-----------------+-------------+----------+--------+ Shoulder             0.25        0.58            +-----------------+-------------+----------+--------+ Prox upper arm       0.26        0.78            +-----------------+-------------+----------+--------+ Mid upper arm        0.28        0.53            +-----------------+-------------+----------+--------+ Dist upper arm       0.29        0.32            +-----------------+-------------+----------+--------+ Antecubital fossa    0.50        0.35            +-----------------+-------------+----------+--------+ Prox forearm         0.23        0.44            +-----------------+-------------+----------+--------+ Mid forearm          0.23        0.28            +-----------------+-------------+----------+--------+ Dist forearm         0.22  0.29            +-----------------+-------------+----------+--------+  +-----------------+-------------+----------+--------+ Right Basilic    Diameter (cm)Depth (cm)Findings +-----------------+-------------+----------+--------+ Prox upper arm       0.38        0.72            +-----------------+-------------+----------+--------+ Mid upper arm        0.33        0.68            +-----------------+-------------+----------+--------+ Dist upper arm       0.39        0.75            +-----------------+-------------+----------+--------+ Antecubital fossa    0.41        0.64            +-----------------+-------------+----------+--------+ Prox forearm         0.20        0.22            +-----------------+-------------+----------+--------+ Mid forearm          0.16        0.22            +-----------------+-------------+----------+--------+ Distal forearm       0.28        0.19            +-----------------+-------------+----------+--------+ +-----------------+-------------+----------+--------+ Left Cephalic    Diameter (cm)Depth (cm)Findings +-----------------+-------------+----------+--------+ Shoulder             0.34        0.57            +-----------------+-------------+----------+--------+ Prox upper arm       0.34        0.55            +-----------------+-------------+----------+--------+ Mid upper arm        0.30        0.31            +-----------------+-------------+----------+--------+ Dist upper arm       0.32        0.34            +-----------------+-------------+----------+--------+ Antecubital fossa    0.53        0.23            +-----------------+-------------+----------+--------+ Prox forearm         0.29        0.45            +-----------------+-------------+----------+--------+ Mid forearm          0.22        0.23            +-----------------+-------------+----------+--------+ Dist forearm         0.20        0.28            +-----------------+-------------+----------+--------+  +-----------------+-------------+----------+--------+ Left Basilic     Diameter (cm)Depth (cm)Findings +-----------------+-------------+----------+--------+ Prox upper arm       0.35        0.69            +-----------------+-------------+----------+--------+ Mid upper arm        0.34        0.66            +-----------------+-------------+----------+--------+ Dist upper arm       0.32        0.77            +-----------------+-------------+----------+--------+ Antecubital fossa    0.25  0.47            +-----------------+-------------+----------+--------+ Prox forearm         0.18        0.25            +-----------------+-------------+----------+--------+ Mid forearm          0.21        0.24            +-----------------+-------------+----------+--------+ Distal forearm       0.20        0.16            +-----------------+-------------+----------+--------+ *See table(s) above for measurements and observations.   Diagnosing physician: Servando Snare MD Electronically signed by Servando Snare MD on 08/13/2019 at 2:48:03 PM.    Final    US Abdomen Limited RUQ  Result Date: 08/23/2019 CLINICAL DATA:  Elevated liver enzymes EXAM: ULTRASOUND ABDOMEN LIMITED RIGHT UPPER QUADRANT COMPARISON:  CT abdomen pelvis 08/13/2019 FINDINGS: Gallbladder: The gallbladder is decompressed which limits evaluation of wall thickness. There are a few small gallstones measuring up to 0.4 cm. No sonographic Murphy sign. Common bile duct: Diameter: 0.4 cm, within normal limits Liver: The liver appears enlarged. No focal lesion identified. Within normal limits in parenchymal echogenicity. Portal vein is patent on color Doppler imaging with normal direction of blood flow towards the liver. Other: Moderate size right pleural effusion. Possible increased echogenicity of the right kidney incidentally noted. IMPRESSION: 1. The liver appears enlarged. Normal parenchymal echogenicity. No focal lesion  identified. 2. The gallbladder is decompressed which limits evaluation. Cholelithiasis without other evidence of cholecystitis. 3.  Moderate right pleural effusion. 4.  Possible increased echogenicity of the right kidney. Electronically Signed   By: Audie Pinto M.D.   On: 08/23/2019 15:40     Subjective: Feeling well on day of discharge. Seen at HD. Mild nausea and headache during dialysis. No new complaints.   Discharge Exam: Vitals:   08/25/19 1000 08/25/19 1010  BP: 136/65 (!) 152/82  Pulse:  84  Resp: 18 19  Temp:  98.3 F (36.8 C)  SpO2:  99%   Vitals:   08/25/19 0900 08/25/19 0930 08/25/19 1000 08/25/19 1010  BP: (!) 147/75 135/73 136/65 (!) 152/82  Pulse: 81 81  84  Resp: 18 20 18 19   Temp:    98.3 F (36.8 C)  TempSrc:    Oral  SpO2:    99%  Weight:    77.2 kg  Height:        General: Pt is alert, awake, not in acute distress, on HD Cardiovascular: RRR, S1/S2 +, no rubs, no gallops Respiratory: CTA bilaterally, no wheezing, no rhonchi Abdominal: Soft, NT, ND, bowel sounds + Extremities: no edema, no cyanosis  The results of significant diagnostics from this hospitalization (including imaging, microbiology, ancillary and laboratory) are listed below for reference.     Microbiology: Recent Results (from the past 240 hour(s))  Surgical pcr screen     Status: None   Collection Time: 08/16/19  9:18 PM   Specimen: Nasal Mucosa; Nasal Swab  Result Value Ref Range Status   MRSA, PCR NEGATIVE NEGATIVE Final   Staphylococcus aureus NEGATIVE NEGATIVE Final    Comment: (NOTE) The Xpert SA Assay (FDA approved for NASAL specimens in patients 52 years of age and older), is one component of a comprehensive surveillance program. It is not intended to diagnose infection nor to guide or monitor treatment. Performed at [facility], [place] [address]  [place], [place] [address]      Labs: BNP (last 3 results) Recent Labs    08/08/19 1607  BNP 279.0*    Basic Metabolic Panel: Recent Labs  Lab 08/22/19 0639 08/23/19 0222 08/24/19 1227 08/25/19 0358 08/25/19 0816  NA 133* 131* 132* 132* 131*  K 4.4 4.0 4.2 4.6 4.7  CL 96* 94* 92* 95* 92*  CO2 25 25 24 23 24   GLUCOSE 136* 166* 158* 97 81  BUN 40* 25* 41* 48* 48*  CREATININE 5.23* 3.60* 5.20* 5.80* 5.74*  CALCIUM 8.2* 7.6* 8.2* 8.3* 8.4*  MG  --  1.7 1.8  --   --   PHOS 4.4 3.4 4.5 5.0* 5.1*   Liver Function Tests: Recent Labs  Lab 08/22/19 0639 08/23/19 0222 08/24/19 1227 08/25/19 0358 08/25/19 0816  AST  --  158* 55* 43*  --   ALT  --  262* 158* 131*  --   ALKPHOS  --  245* 249* 248*  --   BILITOT  --  1.3* 1.1 1.6*  --   PROT  --  5.8* 6.1* 6.0*  --   ALBUMIN 2.2* 2.1* 2.8* 2.6* 2.5*   No results for input(s): LIPASE, AMYLASE in the last 168 hours. No results for input(s): AMMONIA in the last 168 hours. CBC: Recent Labs  Lab 08/21/19 0555 08/22/19 0639 08/23/19 0222 08/24/19 1227 08/25/19 0815  WBC 11.8* 10.9* 10.1 8.7 8.5  HGB 7.8* 8.3* 7.9* 7.7* 7.7*  HCT 25.2* 27.4* 26.5* 25.1* 25.7*  MCV 96.9 101.9* 100.4* 100.8* 102.0*  PLT 126* 131* 134* 137* 145*   Cardiac Enzymes: No results for input(s): CKTOTAL, CKMB, CKMBINDEX, TROPONINI in the last 168 hours. BNP: Invalid input(s): POCBNP CBG: Recent Labs  Lab 08/24/19 1107 08/24/19 1618 08/24/19 2107 08/25/19 0555 08/25/19 1057  GLUCAP 132* 122* 98 92 79   D-Dimer No results for input(s): DDIMER in the last 72 hours. Hgb A1c No results for input(s): HGBA1C in the last 72 hours. Lipid Profile No results for input(s): CHOL, HDL, LDLCALC, TRIG, CHOLHDL, LDLDIRECT in the last 72 hours. Thyroid function studies No results for input(s): TSH, T4TOTAL, T3FREE, THYROIDAB in the last 72 hours.  Invalid input(s): FREET3 Anemia work up No results for input(s): VITAMINB12, FOLATE, FERRITIN, TIBC, IRON, RETICCTPCT in the last 72 hours. Urinalysis    Component Value Date/Time   COLORURINE YELLOW (A)  08/10/2019 0325   APPEARANCEUR TURBID (A) 08/10/2019 0325   LABSPEC 1.013 08/10/2019 0325   PHURINE 7.0 08/10/2019 0325   GLUCOSEU NEGATIVE 08/10/2019 0325   HGBUR NEGATIVE 08/10/2019 0325   HGBUR negative 09/14/2009 1428   BILIRUBINUR NEGATIVE 08/10/2019 0325   KETONESUR NEGATIVE 08/10/2019 0325   PROTEINUR >=300 (A) 08/10/2019 0325   UROBILINOGEN 1.0 10/27/2013 2022   NITRITE NEGATIVE 08/10/2019 0325   LEUKOCYTESUR MODERATE (A) 08/10/2019 0325   Sepsis Labs Invalid input(s): PROCALCITONIN,  WBC,  LACTICIDVEN Microbiology Recent Results (from the past 240 hour(s))  Surgical pcr screen     Status: None   Collection Time: 08/16/19  9:18 PM   Specimen: Nasal Mucosa; Nasal Swab  Result Value Ref Range Status   MRSA, PCR NEGATIVE NEGATIVE Final   Staphylococcus aureus NEGATIVE NEGATIVE Final    Comment: (NOTE) The Xpert SA Assay (FDA approved for NASAL specimens in patients 30 years of age and older), is one component of a comprehensive surveillance program. It is not intended to diagnose infection nor to guide or monitor treatment. Performed at [facility], [address] [place], [place] [address]    Time coordinating discharge: Over 30 minutes  SIGNED:  Hoyt Koch, MD  [facility] 08/25/2019, 11:24 AM Pager   If 7PM-7AM, please contact night-coverage [email or web address] Password [email or web address]

## 2019-08-25 NOTE — Care Management (Signed)
Patient entered in [facility] with no co pays. [facility] will bring medications to patient's room prior to discharge. Mateo Flow with [facility] aware discharge is today.   Patient has a ride home.  Magdalen Spatz RN

## 2019-08-25 NOTE — Plan of Care (Signed)
  Problem: Education: Goal: Knowledge of disease or condition will improve Outcome: Adequate for Discharge Goal: Knowledge of secondary prevention will improve Outcome: Adequate for Discharge   Problem: Nutrition: Goal: Dietary intake will improve Outcome: Adequate for Discharge   Problem: Ischemic Stroke/TIA Tissue Perfusion: Goal: Complications of ischemic stroke/TIA will be minimized Outcome: Adequate for Discharge   Problem: Pain Managment: Goal: General experience of comfort will improve Outcome: Adequate for Discharge   Problem: Education: Goal: Knowledge of secondary prevention will improve Outcome: Adequate for Discharge

## 2019-08-25 NOTE — Progress Notes (Cosign Needed)
APP STUDENT NOTE FOR EDUCATION PURPOSES       Patient ID: Melanie Hall, female   DOB: 1970/10/05, 49 y.o.   MRN: [id number]  [facility] Progress Note   Assessment/ Plan:   1.  Volume overload/nephrotic syndrome: Secondary to progressive chronic kidney disease from renal biopsy-proven diabetic nephrosclerosis, improving on HD.   2. ESRD: New start on hemodialysis (on 2/3) with renal biopsy showing diabetic kidney disease with advanced tubular atrophy/interstitial sclerosis.  Currently on TTS maintenance hemodialysis schedule. [facility] accepted patient for MWF outpatient dialysis schedule with 3pm seat. Daughter to transport patient.  Patient is status post left brachiocephalic fistula on 12/16/6787 and with a functioning right IJ TDC catheter.  Cr peaked at ~8 during hospital course and is down to 3.6 on 2/14. Metabolic acidosis resolved with sodium bicarbonate and wad discontinued. 3. Anemia: Status post PRBC transfusions during hospital course, on intravenous iron/ESA for iron deficiency. Hb stable, 7.7 today 4. CKD-MBD: Calcium/phosphorus level currently at goal with hemodialysis/ferric citrate. 5. Nutrition: renal diet with fluid restriction, continue Nepro/renal MVI. 6. Hypertension: Blood pressures supported with midodrine, continue to monitor with HD.  Autonomic neuropathy from diabetes. 7.  Nausea/abdominal pain: Liver enzymes elevated and down trending.  right upper quadrant ultrasound with stones present and no cholecystitis,  enlarged liver with normal parenchyma. Hepatitis panel negative.   Subjective:   Patient ready for discharge after HD this morning. Patient tolerating HD well. She continues to endorse nausea and migraine. Denies fevers, pruritis, dyspnea, flank pain, loss of appetite.    Objective:   BP 133/79   Pulse 65   Temp 98.1 F (36.7 C) (Oral)   Resp (!) 23   Ht 5\' 3"  (1.6 m)   Wt 80.1 kg   LMP 12/17/2016   SpO2 96%   BMI 31.28 kg/m    Physical Exam: Gen: seen on HD unit, tolerating HD  CVS: RRR, S1 / S2 distinct. Right IJ TDC without signs of infection Resp: Clear to auscultation, decreased breath sounds at bases, no crackles Abd: BS normoactive, soft and non-distended  Ext: 2+ lower extremity edema, left BCF with bruit and no signs of infection   Labs: BMET Recent Labs  Lab 08/19/19 0514 08/20/19 0131 08/21/19 0555 08/22/19 0639 08/23/19 0222 08/24/19 1227 08/25/19 0358  NA 136 133* 136 133* 131* 132* 132*  K 4.4 4.4 3.9 4.4 4.0 4.2 4.6  CL 99 95* 96* 96* 94* 92* 95*  CO2 23 23 26 25 25 24 23   GLUCOSE 105* 165* 120* 136* 166* 158* 97  BUN 32* 40* 25* 40* 25* 41* 48*  CREATININE 4.29* 5.48* 3.98* 5.23* 3.60* 5.20* 5.80*  CALCIUM 8.2* 8.1* 8.0* 8.2* 7.6* 8.2* 8.3*  PHOS 5.0* 5.3* 3.7 4.4 3.4 4.5 5.0*   CBC Recent Labs  Lab 08/21/19 0555 08/22/19 0639 08/23/19 0222 08/24/19 1227  WBC 11.8* 10.9* 10.1 8.7  HGB 7.8* 8.3* 7.9* 7.7*  HCT 25.2* 27.4* 26.5* 25.1*  MCV 96.9 101.9* 100.4* 100.8*  PLT 126* 131* 134* 137*     Medications:     carvedilol  3.125 mg Oral BID WC   Chlorhexidine Gluconate Cloth  6 each Topical Q0600   darbepoetin (ARANESP) injection - DIALYSIS  100 mcg Intravenous Q Tue-HD   feeding supplement (NEPRO CARB STEADY)  237 mL Oral Q24H   feeding supplement (PRO-STAT SUGAR FREE 64)  30 mL Oral TID BM   ferric citrate  420 mg Oral TID WC   gabapentin  100 mg Oral BID   heparin  5,000 Units Subcutaneous Q8H   insulin aspart  0-9 Units Subcutaneous TID WC   levothyroxine  25 mcg Oral Daily   midodrine  10 mg Oral Q T,Th,Sa-HD   midodrine  5 mg Oral TID WC   multivitamin  1 tablet Oral QHS   pantoprazole  40 mg Oral QHS   polyethylene glycol  17 g Oral Daily   traZODone  50 mg Oral QHS     Harrell Gave, PA STUDENT 08/25/2019, 8:08 AM

## 2019-08-25 NOTE — Progress Notes (Signed)
Gave report to HD RN. Pt stable. No acute distress noted. Vitals WDL. Tranported to HD by transporter.

## 2019-08-25 NOTE — Progress Notes (Signed)
While preparing to get patient ready for discharge, she was unable to stand with walker. Patient is weak, and asks for a lot of assistance with ADLs.  Called doctor and case management, concerned about a safe discharge to home with daughter. Patient refuses to go to snf, refuses to go home with daughter. Patient alert and oriented x4 and reports wanting to go back to her home, address listed in Epic.  Case management set up for [facility] to transport her back home.

## 2019-08-26 DIAGNOSIS — D631 Anemia in chronic kidney disease: Secondary | ICD-10-CM | POA: Diagnosis not present

## 2019-08-26 DIAGNOSIS — Z794 Long term (current) use of insulin: Secondary | ICD-10-CM | POA: Diagnosis not present

## 2019-08-26 DIAGNOSIS — Z992 Dependence on renal dialysis: Secondary | ICD-10-CM | POA: Diagnosis not present

## 2019-08-26 DIAGNOSIS — D509 Iron deficiency anemia, unspecified: Secondary | ICD-10-CM | POA: Diagnosis not present

## 2019-08-26 DIAGNOSIS — E119 Type 2 diabetes mellitus without complications: Secondary | ICD-10-CM | POA: Diagnosis not present

## 2019-08-26 DIAGNOSIS — N186 End stage renal disease: Secondary | ICD-10-CM | POA: Diagnosis not present

## 2019-08-28 DIAGNOSIS — N186 End stage renal disease: Secondary | ICD-10-CM | POA: Diagnosis not present

## 2019-08-28 DIAGNOSIS — D509 Iron deficiency anemia, unspecified: Secondary | ICD-10-CM | POA: Diagnosis not present

## 2019-08-28 DIAGNOSIS — Z992 Dependence on renal dialysis: Secondary | ICD-10-CM | POA: Diagnosis not present

## 2019-08-28 DIAGNOSIS — D631 Anemia in chronic kidney disease: Secondary | ICD-10-CM | POA: Diagnosis not present

## 2019-08-31 DIAGNOSIS — D631 Anemia in chronic kidney disease: Secondary | ICD-10-CM | POA: Diagnosis not present

## 2019-08-31 DIAGNOSIS — D509 Iron deficiency anemia, unspecified: Secondary | ICD-10-CM | POA: Diagnosis not present

## 2019-08-31 DIAGNOSIS — Z992 Dependence on renal dialysis: Secondary | ICD-10-CM | POA: Diagnosis not present

## 2019-08-31 DIAGNOSIS — N186 End stage renal disease: Secondary | ICD-10-CM | POA: Diagnosis not present

## 2019-09-01 ENCOUNTER — Encounter: Payer: Self-pay | Admitting: Orthopedic Surgery

## 2019-09-02 DIAGNOSIS — D509 Iron deficiency anemia, unspecified: Secondary | ICD-10-CM | POA: Diagnosis not present

## 2019-09-02 DIAGNOSIS — D631 Anemia in chronic kidney disease: Secondary | ICD-10-CM | POA: Diagnosis not present

## 2019-09-02 DIAGNOSIS — Z992 Dependence on renal dialysis: Secondary | ICD-10-CM | POA: Diagnosis not present

## 2019-09-02 DIAGNOSIS — N186 End stage renal disease: Secondary | ICD-10-CM | POA: Diagnosis not present

## 2019-09-04 DIAGNOSIS — D509 Iron deficiency anemia, unspecified: Secondary | ICD-10-CM | POA: Diagnosis not present

## 2019-09-04 DIAGNOSIS — Z992 Dependence on renal dialysis: Secondary | ICD-10-CM | POA: Diagnosis not present

## 2019-09-04 DIAGNOSIS — N186 End stage renal disease: Secondary | ICD-10-CM | POA: Diagnosis not present

## 2019-09-04 DIAGNOSIS — D631 Anemia in chronic kidney disease: Secondary | ICD-10-CM | POA: Diagnosis not present

## 2019-09-04 NOTE — Telephone Encounter (Signed)
I last saw her in July 2020.  She had retrograde femoral nail.  I do think any of her bilateral leg numbness has anything to do with that procedure.  If she needs evaluation on this she should come in for evaluation.  I cannot really offer her any advice on this particular problem.

## 2019-09-06 DIAGNOSIS — Z992 Dependence on renal dialysis: Secondary | ICD-10-CM | POA: Diagnosis not present

## 2019-09-06 DIAGNOSIS — N186 End stage renal disease: Secondary | ICD-10-CM | POA: Diagnosis not present

## 2019-09-07 DIAGNOSIS — N186 End stage renal disease: Secondary | ICD-10-CM | POA: Diagnosis not present

## 2019-09-07 DIAGNOSIS — Z992 Dependence on renal dialysis: Secondary | ICD-10-CM | POA: Diagnosis not present

## 2019-09-07 DIAGNOSIS — D509 Iron deficiency anemia, unspecified: Secondary | ICD-10-CM | POA: Diagnosis not present

## 2019-09-07 DIAGNOSIS — D631 Anemia in chronic kidney disease: Secondary | ICD-10-CM | POA: Diagnosis not present

## 2019-09-09 ENCOUNTER — Inpatient Hospital Stay (HOSPITAL_COMMUNITY)
Admission: EM | Admit: 2019-09-09 | Discharge: 2019-09-19 | DRG: 682 | Disposition: A | Discharge status: Home / Self Care | Payer: Medicaid Other | Attending: Internal Medicine | Admitting: Internal Medicine

## 2019-09-09 ENCOUNTER — Other Ambulatory Visit: Payer: Self-pay

## 2019-09-09 ENCOUNTER — Emergency Department (HOSPITAL_COMMUNITY): Payer: Medicaid Other

## 2019-09-09 DIAGNOSIS — Z20822 Contact with and (suspected) exposure to covid-19: Secondary | ICD-10-CM | POA: Diagnosis present

## 2019-09-09 DIAGNOSIS — L8990 Pressure ulcer of unspecified site, unspecified stage: Secondary | ICD-10-CM | POA: Diagnosis present

## 2019-09-09 DIAGNOSIS — E1142 Type 2 diabetes mellitus with diabetic polyneuropathy: Secondary | ICD-10-CM | POA: Diagnosis present

## 2019-09-09 DIAGNOSIS — Z818 Family history of other mental and behavioral disorders: Secondary | ICD-10-CM

## 2019-09-09 DIAGNOSIS — R195 Other fecal abnormalities: Secondary | ICD-10-CM

## 2019-09-09 DIAGNOSIS — E1169 Type 2 diabetes mellitus with other specified complication: Secondary | ICD-10-CM | POA: Diagnosis present

## 2019-09-09 DIAGNOSIS — Z87891 Personal history of nicotine dependence: Secondary | ICD-10-CM

## 2019-09-09 DIAGNOSIS — E1136 Type 2 diabetes mellitus with diabetic cataract: Secondary | ICD-10-CM | POA: Diagnosis present

## 2019-09-09 DIAGNOSIS — I120 Hypertensive chronic kidney disease with stage 5 chronic kidney disease or end stage renal disease: Principal | ICD-10-CM | POA: Diagnosis present

## 2019-09-09 DIAGNOSIS — F329 Major depressive disorder, single episode, unspecified: Secondary | ICD-10-CM | POA: Diagnosis present

## 2019-09-09 DIAGNOSIS — E7800 Pure hypercholesterolemia, unspecified: Secondary | ICD-10-CM | POA: Diagnosis present

## 2019-09-09 DIAGNOSIS — Z794 Long term (current) use of insulin: Secondary | ICD-10-CM

## 2019-09-09 DIAGNOSIS — Z992 Dependence on renal dialysis: Secondary | ICD-10-CM | POA: Diagnosis not present

## 2019-09-09 DIAGNOSIS — R5381 Other malaise: Secondary | ICD-10-CM | POA: Diagnosis present

## 2019-09-09 DIAGNOSIS — D509 Iron deficiency anemia, unspecified: Secondary | ICD-10-CM | POA: Diagnosis not present

## 2019-09-09 DIAGNOSIS — Z8249 Family history of ischemic heart disease and other diseases of the circulatory system: Secondary | ICD-10-CM

## 2019-09-09 DIAGNOSIS — D631 Anemia in chronic kidney disease: Secondary | ICD-10-CM | POA: Diagnosis present

## 2019-09-09 DIAGNOSIS — Z993 Dependence on wheelchair: Secondary | ICD-10-CM

## 2019-09-09 DIAGNOSIS — N2581 Secondary hyperparathyroidism of renal origin: Secondary | ICD-10-CM | POA: Diagnosis present

## 2019-09-09 DIAGNOSIS — E785 Hyperlipidemia, unspecified: Secondary | ICD-10-CM | POA: Diagnosis present

## 2019-09-09 DIAGNOSIS — Z79899 Other long term (current) drug therapy: Secondary | ICD-10-CM

## 2019-09-09 DIAGNOSIS — N186 End stage renal disease: Secondary | ICD-10-CM

## 2019-09-09 DIAGNOSIS — R29898 Other symptoms and signs involving the musculoskeletal system: Secondary | ICD-10-CM

## 2019-09-09 DIAGNOSIS — Z833 Family history of diabetes mellitus: Secondary | ICD-10-CM

## 2019-09-09 DIAGNOSIS — Z808 Family history of malignant neoplasm of other organs or systems: Secondary | ICD-10-CM

## 2019-09-09 DIAGNOSIS — Z79890 Hormone replacement therapy (postmenopausal): Secondary | ICD-10-CM

## 2019-09-09 DIAGNOSIS — Z803 Family history of malignant neoplasm of breast: Secondary | ICD-10-CM

## 2019-09-09 DIAGNOSIS — B373 Candidiasis of vulva and vagina: Secondary | ICD-10-CM | POA: Diagnosis present

## 2019-09-09 DIAGNOSIS — F32A Depression, unspecified: Secondary | ICD-10-CM | POA: Diagnosis present

## 2019-09-09 DIAGNOSIS — L89151 Pressure ulcer of sacral region, stage 1: Secondary | ICD-10-CM | POA: Diagnosis present

## 2019-09-09 DIAGNOSIS — E1159 Type 2 diabetes mellitus with other circulatory complications: Secondary | ICD-10-CM | POA: Diagnosis present

## 2019-09-09 DIAGNOSIS — I152 Hypertension secondary to endocrine disorders: Secondary | ICD-10-CM | POA: Diagnosis present

## 2019-09-09 DIAGNOSIS — D472 Monoclonal gammopathy: Secondary | ICD-10-CM | POA: Diagnosis present

## 2019-09-09 DIAGNOSIS — I10 Essential (primary) hypertension: Secondary | ICD-10-CM | POA: Diagnosis present

## 2019-09-09 DIAGNOSIS — E1121 Type 2 diabetes mellitus with diabetic nephropathy: Secondary | ICD-10-CM | POA: Diagnosis present

## 2019-09-09 DIAGNOSIS — R159 Full incontinence of feces: Secondary | ICD-10-CM

## 2019-09-09 DIAGNOSIS — R531 Weakness: Secondary | ICD-10-CM

## 2019-09-09 DIAGNOSIS — E039 Hypothyroidism, unspecified: Secondary | ICD-10-CM | POA: Diagnosis present

## 2019-09-09 DIAGNOSIS — R32 Unspecified urinary incontinence: Secondary | ICD-10-CM | POA: Diagnosis present

## 2019-09-09 DIAGNOSIS — E1165 Type 2 diabetes mellitus with hyperglycemia: Secondary | ICD-10-CM | POA: Diagnosis present

## 2019-09-09 DIAGNOSIS — E1122 Type 2 diabetes mellitus with diabetic chronic kidney disease: Secondary | ICD-10-CM | POA: Diagnosis present

## 2019-09-09 DIAGNOSIS — N3000 Acute cystitis without hematuria: Secondary | ICD-10-CM

## 2019-09-09 DIAGNOSIS — G834 Cauda equina syndrome: Secondary | ICD-10-CM | POA: Diagnosis present

## 2019-09-09 HISTORY — DX: Dependence on renal dialysis: N18.6

## 2019-09-09 HISTORY — DX: Dependence on renal dialysis: Z99.2

## 2019-09-09 LAB — POC OCCULT BLOOD, ED: Fecal Occult Bld: NEGATIVE

## 2019-09-09 IMAGING — CT CT HEAD W/O CM
4 series · 15 of 47 positions shown, 17 images · non-contrast
Comparison: [DATE]

CLINICAL DATA: Weakness, locking

EXAM:
CT HEAD WITHOUT CONTRAST
TECHNIQUE: Contiguous axial images were obtained from the base of the skull
through the vertex without intravenous contrast.

[Series 2: head w o · axial · 0.39mm/px · z∈[+142,+242]mm · 7 of 28 slices shown, 9 images]
[im 4/28  brain]
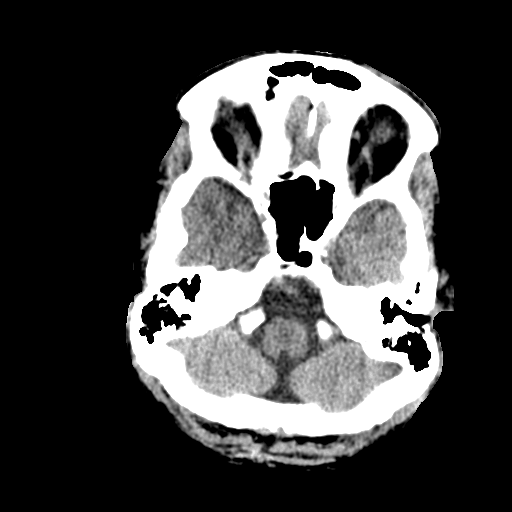
[im 4/28  bone]
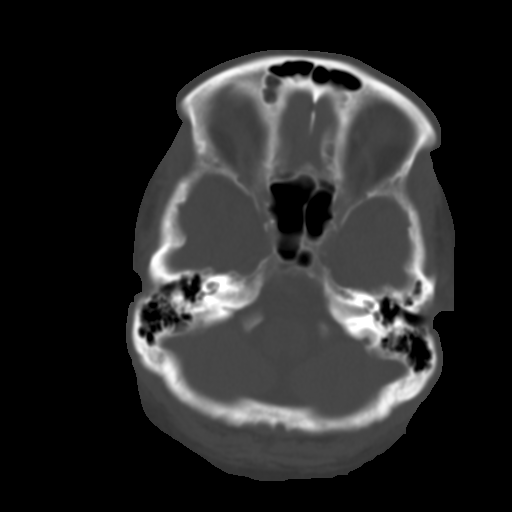
[im 7/28  brain]
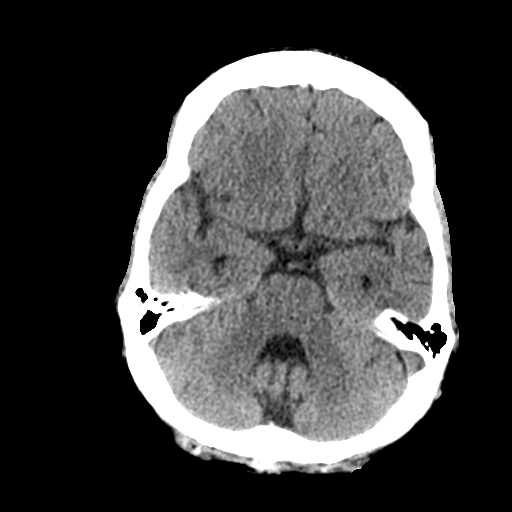
[im 11/28  brain]
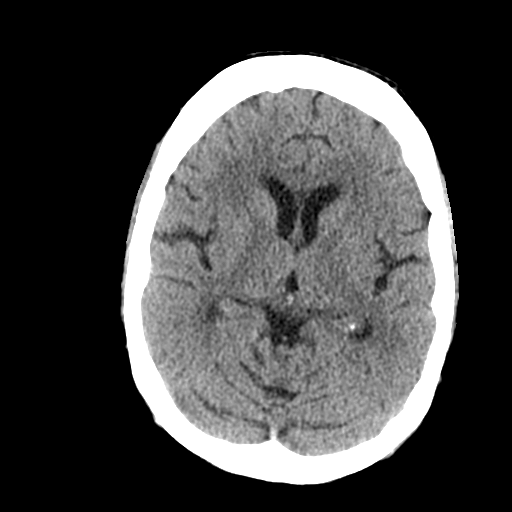
[im 14/28  brain]
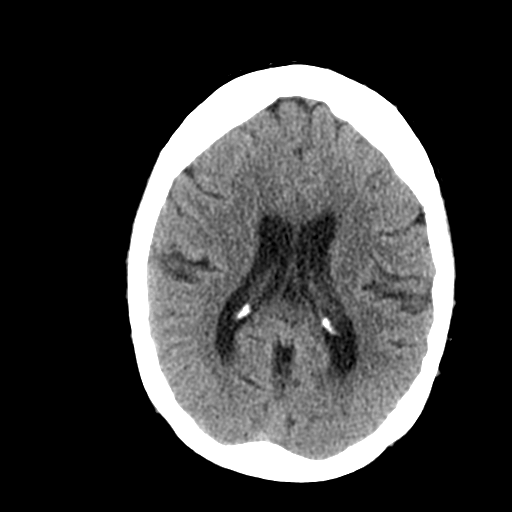
[im 17/28  brain]
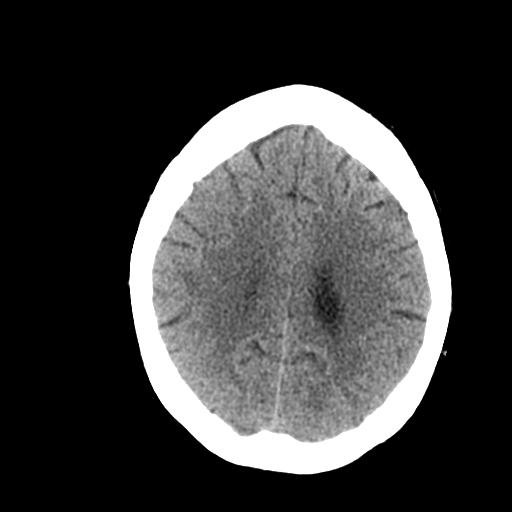
[im 17/28  bone]
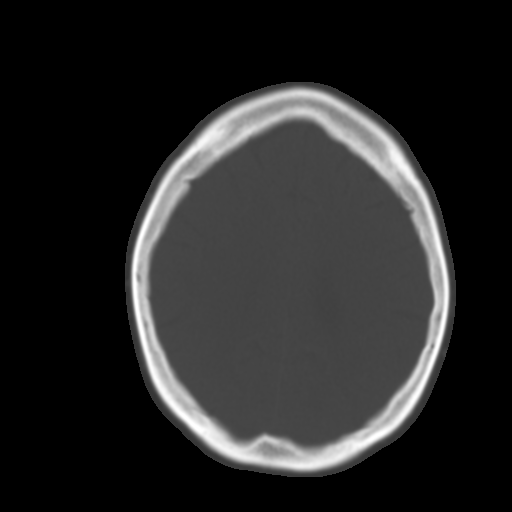
[im 21/28  brain]
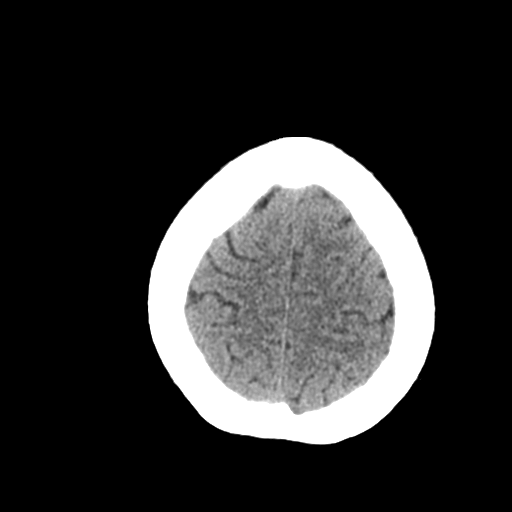
[im 24/28  brain]
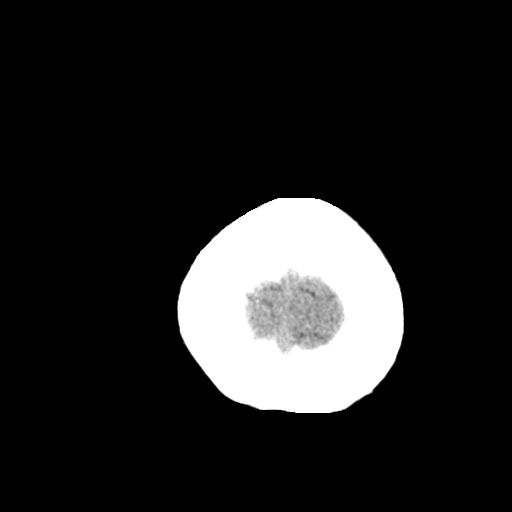

[Series 3: head bone · axial · 0.39mm/px · z∈[+139,+153]mm · 2 of 70 slices shown]
[im 7/70  bone]
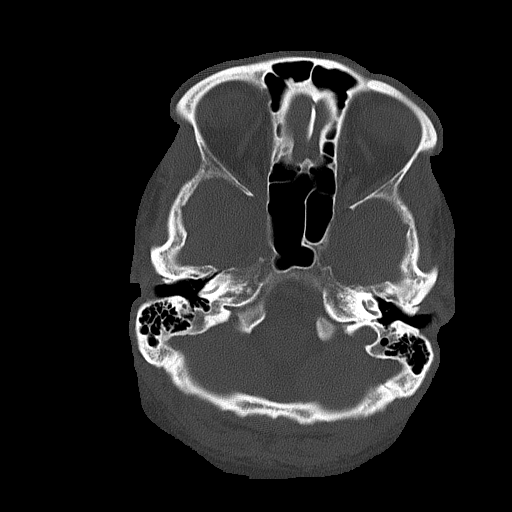
[im 14/70  bone]
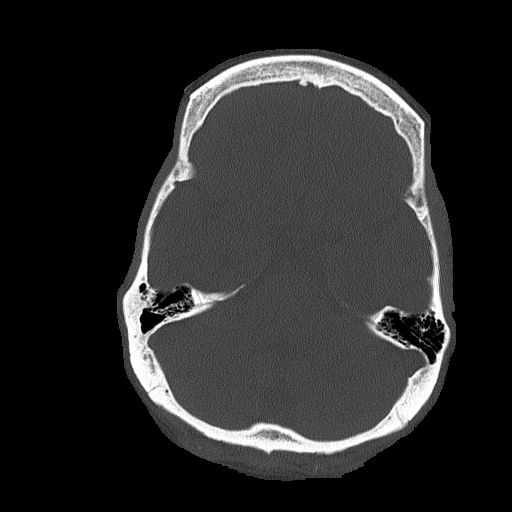

[Series 4: coronal soft · coronal · 0.27mm/px · 3 of 58 slices shown]
[im 20/58  brain]
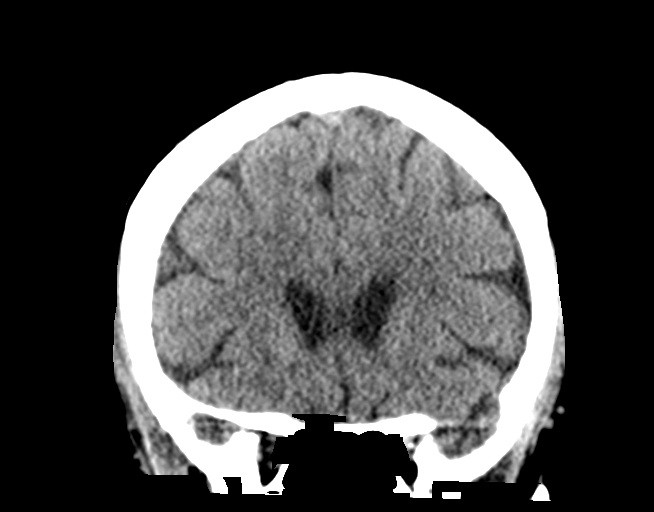
[im 26/58  brain]
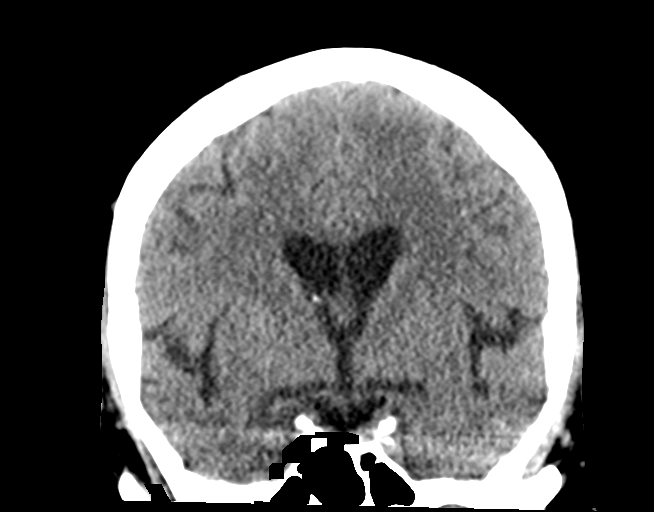
[im 32/58  brain]
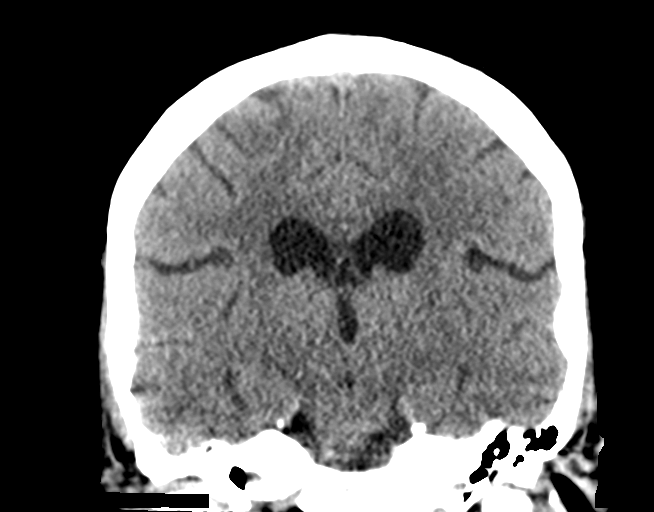

[Series 5: sagittal soft · sagittal · 0.29mm/px · 3 of 49 slices shown]
[im 17/49  brain]
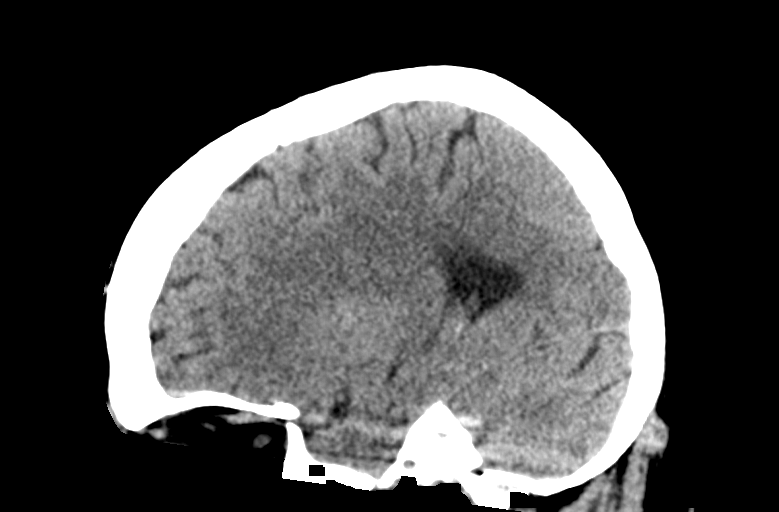
[im 25/49  brain]
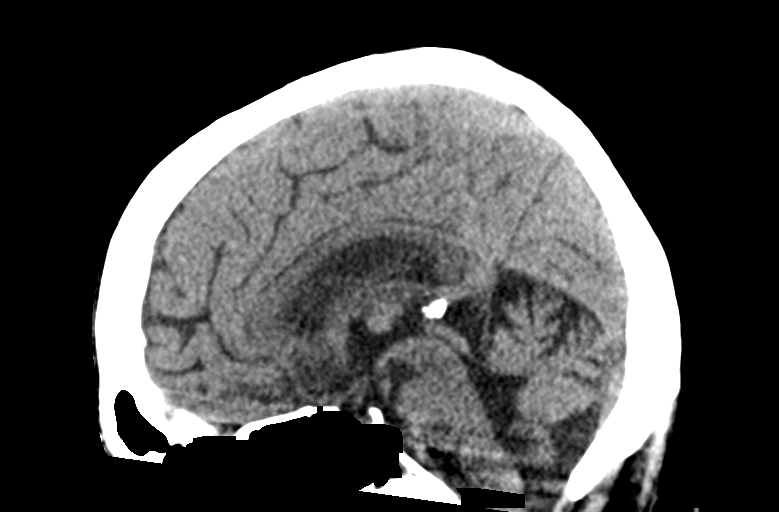
[im 33/49  brain]
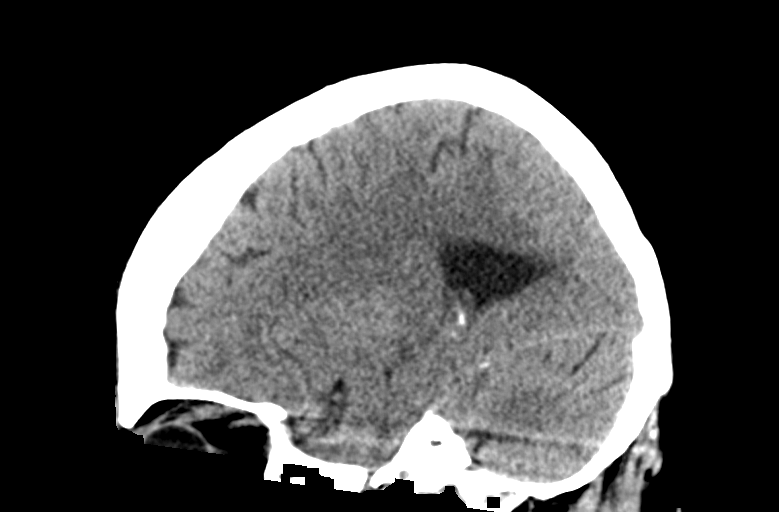

[15 of 47 positions shown; findings below may reference images not displayed]

FINDINGS: Brain: No evidence of acute territorial infarction, hemorrhage,
hydrocephalus,extra-axial collection or mass lesion/mass effect.
Normal gray-white differentiation. Ventricles are normal in size and
contour.

Vascular: No hyperdense vessel or unexpected calcification.

Skull: The skull is intact. No fracture or focal lesion identified.

Sinuses/Orbits: The visualized paranasal sinuses and mastoid air
cells are clear. The orbits and globes intact.

Other: None
IMPRESSION: No acute intracranial abnormality.

## 2019-09-09 IMAGING — DX DG CHEST 1V PORT
1 series · 1 of 1 positions shown · non-contrast
Comparison: [DATE]

CLINICAL DATA: Weakness and shortness of breath

EXAM:
PORTABLE CHEST 1 VIEW

[chest ap]
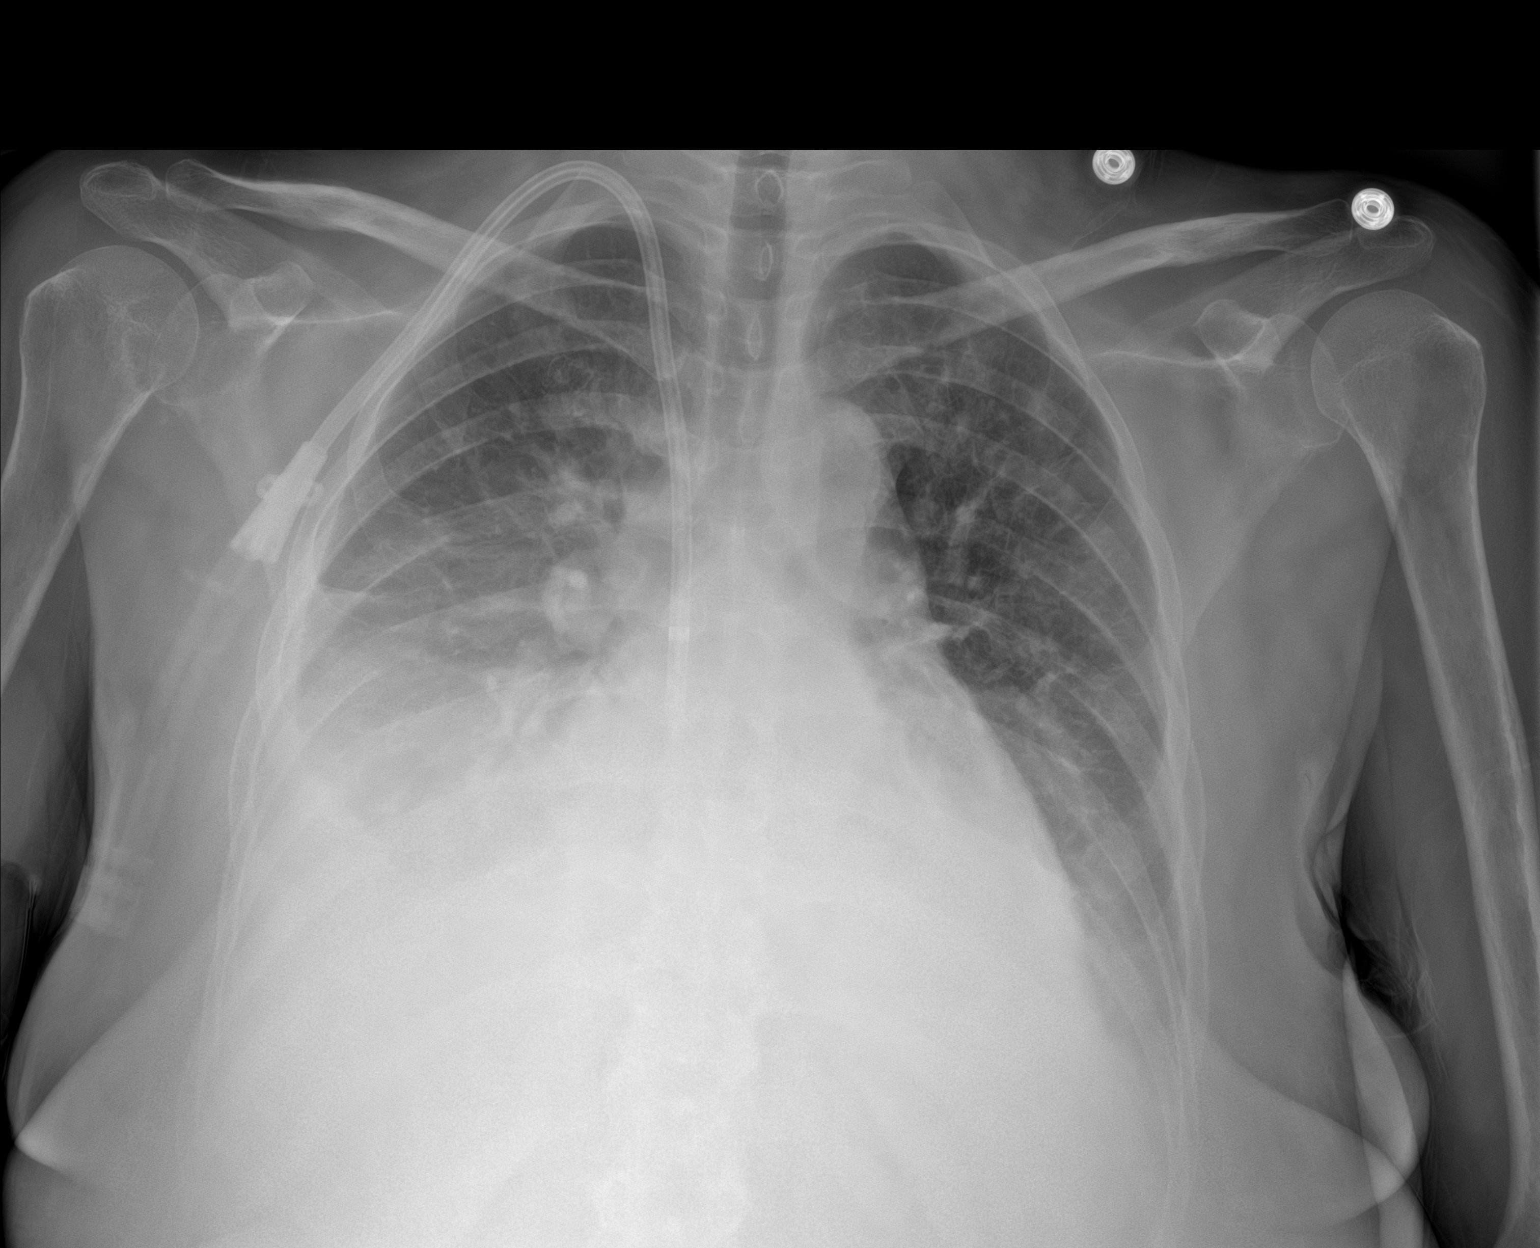

[1 of 1 positions shown; findings below may reference images not displayed]

FINDINGS: Cardiac shadow is stable. Dialysis catheter is now seen in
satisfactory position. Pleural effusions are noted right greater
than left with central vascular congestion. This may be related to a
degree of volume overload. Bibasilar atelectasis is seen. Patchy
opacities are noted which may be related to atypical pneumonia.
Clinical correlation is recommended.
IMPRESSION: Vascular congestion and effusions likely related to volume overload.
This is new from the prior exam.

Patchy opacities are noted in both lungs suspicious for atypical
pneumonia. Correlation with [BT] testing is recommended.

## 2019-09-09 NOTE — ED Provider Notes (Signed)
[facility] EMERGENCY DEPARTMENT Provider Note   CSN: [id number] Arrival date & time: 09/09/19  2025     History Chief Complaint  Patient presents with  . Weakness    Melanie Hall is a 49 y.o. female.  Patient with history of diabetes, CKD, recently started on dialysis beginning of February 2021 presenting from home with "my legs do not work".  States she feels generally weak and cannot stand and cannot walk and has not walked since she got home from the hospital.  She refused to go to rehab placement.  She states her legs are able to be moved but she has weakness in them and she cannot put weight on them or stand.  She also states that she feels like she needs to urinate but cannot.  She is had multiple episodes of bowel and bladder incontinence.  This has been happening on a daily basis since she was discharged from the hospital.  Her daughter has been driving her to and from dialysis with a wheelchair.  She last went to dialysis today has not missed any sessions.  No fevers, chills, nausea or vomiting.  No chest pain or shortness of breath.  She still makes some urine.  States that she feels like she has to urinate but cannot.  Discussed with her daughter Melanie Hall.  States he feels that patient has mental issues that are prohibiting her recovery.  Patient did refuse to go to rehab.  The history is provided by the patient and a relative.  Weakness Associated symptoms: myalgias   Associated symptoms: no abdominal pain, no cough, no dizziness, no dysuria, no fever, no headaches, no nausea, no shortness of breath and no vomiting        Past Medical History:  Diagnosis Date  . Anemia   . Cataracts, bilateral    surgery to remove  . CKD (chronic kidney disease)   . Diabetes mellitus    type 2  . GERD (gastroesophageal reflux disease)   . Hypertension   . SVD (spontaneous vaginal delivery)    x 4    Patient Active Problem List   Diagnosis Date Noted  . Anemia in chronic  kidney disease 08/19/2019  . Accelerated hypertension 08/08/2019  . Right arm weakness 08/08/2019  . MGUS (monoclonal gammopathy of unknown significance) 07/23/2019  . Diarrhea   . AKI (acute kidney injury) ([place]) 02/28/2019  . Acute on chronic renal failure ([place]) 02/28/2019  . GERD (gastroesophageal reflux disease) 02/28/2019  . Closed fracture of left femur ([place]) 10/28/2018  . Hypertension associated with diabetes ([place]) 10/28/2018  . Sprain of left ankle   . Emphysematous cystitis 08/26/2018  . Vaginosis 08/26/2018  . Hypokalemia 08/26/2018  . Normocytic anemia 08/26/2018  . Closed fracture of right ankle 11/06/2017  . IRREGULAR MENSES 09/14/2009  . BURN, FIRST DEGREE, ARM 09/14/2009  . HYPERCHOLESTEROLEMIA 11/19/2008  . PARONYCHIA, RIGHT GREAT TOE 07/30/2008  . ABSCESS, TOOTH 03/31/2008  . Type 2 diabetes with nephropathy ([place]) 07/02/2007  . CATARACTS, BILATERAL 07/02/2007    Past Surgical History:  Procedure Laterality Date  . AV FISTULA PLACEMENT Left 08/17/2019   Procedure: LEFT BRACHIAL CEPHALIC ARTERIOVENOUS (AV) FISTULA;  Surgeon: Angelia Mould, MD;  Location: [facility];  Service: Vascular;  Laterality: Left;  . EYE SURGERY Bilateral    cataracts removed  . FEMUR IM NAIL Left 10/28/2018   Procedure: RETROGRADE FEMORAL NAILING;  Surgeon: Meredith Pel, MD;  Location: [facility];  Service: Orthopedics;  Laterality: Left;  .  IM NAILING FEMORAL SHAFT RETROGRADE Left 10/28/2018  . IR FLUORO GUIDE CV LINE RIGHT  08/11/2019  . IR US GUIDE VASC ACCESS RIGHT  08/11/2019  . KNEE SURGERY    . TUBAL LIGATION       OB History    Gravida  4   Para  4   Term  4   Preterm      AB      Living        SAB      TAB      Ectopic      Multiple      Live Births              Family History  Problem Relation Age of Onset  . Diabetes Mother   . Hypertension Mother   . Bipolar disorder Mother   . Brain cancer Maternal Aunt   . Heart disease Maternal Grandmother    . Diabetes Maternal Grandmother   . Breast cancer Maternal Aunt   . Diabetes Sister     Social History   Tobacco Use  . Smoking status: Former Smoker    Types: Cigarettes    Quit date: 1997    Years since quitting: 24.1  . Smokeless tobacco: Never Used  Substance Use Topics  . Alcohol use: No  . Drug use: No    Home Medications Prior to Admission medications   Medication Sig Start Date End Date Taking? Authorizing Provider  amLODipine (NORVASC) 5 MG tablet Take 5 mg by mouth daily.   Yes [provider]  carvedilol (COREG) 3.125 MG tablet Take 1 tablet (3.125 mg total) by mouth 2 (two) times daily with a meal. 08/25/19 08/24/20 Yes Hoyt Koch, MD  epoetin alfa (EPOGEN) 3000 UNIT/ML injection Inject 3,000 Units into the skin every 14 (fourteen) days.  06/12/19  Yes [provider]  gabapentin (NEURONTIN) 100 MG capsule Take 1 capsule (100 mg total) by mouth 2 (two) times daily. 08/25/19  Yes Hoyt Koch, MD  levothyroxine (SYNTHROID) 25 MCG tablet Take 1 tablet (25 mcg total) by mouth daily. 08/25/19  Yes Hoyt Koch, MD  lovastatin (MEVACOR) 40 MG tablet Take 1 tablet (40 mg total) by mouth at bedtime. 08/25/19  Yes Hoyt Koch, MD  midodrine (PROAMATINE) 5 MG tablet Take 1 tablet (5 mg total) by mouth 3 (three) times daily with meals. Patient taking differently: Take 5 mg by mouth See admin instructions. Take 5mg  three times daily on dialysis days. Take 5mg  at breakfast and bedtime, then take two tablets before treatment on dialysis days 08/25/19  Yes Hoyt Koch, MD  multivitamin (RENA-VIT) TABS tablet Take 1 tablet by mouth at bedtime. 08/25/19  Yes Hoyt Koch, MD  pantoprazole (PROTONIX) 40 MG tablet Take 1 tablet (40 mg total) by mouth daily. 08/25/19 08/24/20 Yes Hoyt Koch, MD  acetaminophen (TYLENOL) 325 MG tablet Take 2 tablets (650 mg total) by mouth every 6 (six) hours as needed for mild pain  or headache (or Fever >/= 101). Patient not taking: Reported on 07/23/2019 08/28/18   Barton Dubois, MD  insulin aspart protamine- aspart (NOVOLOG MIX 70/30) (70-30) 100 UNIT/ML injection Inject 0.1 mLs (10 Units total) into the skin 2 (two) times daily with a meal. Patient not taking: Reported on 09/09/2019 08/25/19   Hoyt Koch, MD  midodrine (PROAMATINE) 10 MG tablet Take 1 tablet (10 mg total) by mouth Every Tuesday,Thursday,and Saturday with dialysis. Patient not taking: Reported  on 09/09/2019 08/27/19   Hoyt Koch, MD    Allergies    Patient has no known allergies.  Review of Systems   Review of Systems  Constitutional: Positive for activity change and appetite change. Negative for fever.  HENT: Negative for congestion and rhinorrhea.   Respiratory: Negative for cough, chest tightness and shortness of breath.   Gastrointestinal: Negative for abdominal pain, nausea and vomiting.  Genitourinary: Positive for decreased urine volume and difficulty urinating. Negative for dysuria and hematuria.  Musculoskeletal: Positive for myalgias.  Neurological: Positive for weakness. Negative for dizziness, syncope and headaches.   all other systems are negative except as noted in the HPI and PMH.    Physical Exam Updated Vital Signs BP (!) 186/87   Pulse 95   Temp 98.9 F (37.2 C) (Oral)   Resp 16   Ht 5\' 3"  (1.6 m)   Wt 81.6 kg   LMP 12/17/2016   SpO2 94%   BMI 31.89 kg/m   Physical Exam Vitals and nursing note reviewed.  Constitutional:      General: She is not in acute distress.    Appearance: She is well-developed.     Comments: Alert and oriented x3  HENT:     Head: Normocephalic and atraumatic.     Mouth/Throat:     Pharynx: No oropharyngeal exudate.  Eyes:     Conjunctiva/sclera: Conjunctivae normal.     Pupils: Pupils are equal, round, and reactive to light.  Neck:     Comments: No meningismus. Cardiovascular:     Rate and Rhythm: Normal rate and  regular rhythm.     Heart sounds: Normal heart sounds. No murmur.  Pulmonary:     Effort: Pulmonary effort is normal. No respiratory distress.     Breath sounds: Normal breath sounds.     Comments: Dialysis catheter right chest Abdominal:     Palpations: Abdomen is soft.     Tenderness: There is no abdominal tenderness. There is no guarding or rebound.  Genitourinary:    Comments: Chaperone present.  Stage I pressure ulcer over the sacrum which is mostly clean. Diminished rectal tone, brown stool Musculoskeletal:        General: No tenderness. Normal range of motion.     Cervical back: Normal range of motion and neck supple.  Skin:    General: Skin is warm.  Neurological:     Mental Status: She is alert and oriented to person, place, and time.     Cranial Nerves: No cranial nerve deficit.     Motor: No abnormal muscle tone.     Coordination: Coordination normal.     Comments: Cranial nerves II to XII intact, 5/5 strength of upper extremities.  No pronator drift.  Ankle flexion and extension intact bilaterally.  Able to raise leg off the bed on right is easier than left. Patient able to sit at the side of the bed and hang her legs over the side. Unable to elicit patellar reflexes bilaterally. Patient able to lift her right leg off the bed and lift her left leg off the bed but the left is somewhat more weak on the right. She has weakness in hip and knee flexion extension bilaterally.  Psychiatric:        Behavior: Behavior normal.     ED Results / Procedures / Treatments   Labs (all labs ordered are listed, but only abnormal results are displayed) Labs Reviewed  CBC WITH DIFFERENTIAL/PLATELET - Abnormal; Notable for the following  components:      Result Value   RBC 3.21 (*)    Hemoglobin 10.0 (*)    HCT 32.3 (*)    MCV 100.6 (*)    RDW 16.6 (*)    All other components within normal limits  COMPREHENSIVE METABOLIC PANEL - Abnormal; Notable for the following components:    Potassium 3.4 (*)    Glucose, Bld 281 (*)    Creatinine, Ser 2.45 (*)    Calcium 8.1 (*)    Albumin 2.9 (*)    Alkaline Phosphatase 154 (*)    GFR calc non Af Amer 22 (*)    GFR calc Af Amer 26 (*)    All other components within normal limits  CULTURE, BLOOD (ROUTINE X 2)  CULTURE, BLOOD (ROUTINE X 2)  URINE CULTURE  RESPIRATORY PANEL BY RT PCR (FLU A&B, COVID)  LACTIC ACID, PLASMA  URINALYSIS, ROUTINE W REFLEX MICROSCOPIC  LACTIC ACID, PLASMA  POC OCCULT BLOOD, ED    EKG None  Radiology CT Head Wo Contrast  Result Date: 09/10/2019 CLINICAL DATA:  Weakness, locking EXAM: CT HEAD WITHOUT CONTRAST TECHNIQUE: Contiguous axial images were obtained from the base of the skull through the vertex without intravenous contrast. COMPARISON:  August 08, 2019 FINDINGS: Brain: No evidence of acute territorial infarction, hemorrhage, hydrocephalus,extra-axial collection or mass lesion/mass effect. Normal gray-white differentiation. Ventricles are normal in size and contour. Vascular: No hyperdense vessel or unexpected calcification. Skull: The skull is intact. No fracture or focal lesion identified. Sinuses/Orbits: The visualized paranasal sinuses and mastoid air cells are clear. The orbits and globes intact. Other: None IMPRESSION: No acute intracranial abnormality. Electronically Signed   By: Prudencio Pair M.D.   On: 09/10/2019 00:18   DG Chest Portable 1 View  Result Date: 09/10/2019 CLINICAL DATA:  Weakness and shortness of breath EXAM: PORTABLE CHEST 1 VIEW COMPARISON:  08/08/2019 FINDINGS: Cardiac shadow is stable. Dialysis catheter is now seen in satisfactory position. Pleural effusions are noted right greater than left with central vascular congestion. This may be related to a degree of volume overload. Bibasilar atelectasis is seen. Patchy opacities are noted which may be related to atypical pneumonia. Clinical correlation is recommended. IMPRESSION: Vascular congestion and effusions likely  related to volume overload. This is new from the prior exam. Patchy opacities are noted in both lungs suspicious for atypical pneumonia. Correlation with COVID-19 testing is recommended. Electronically Signed   By: Inez Catalina M.D.   On: 09/10/2019 00:28    Procedures .Critical Care Performed by: Ezequiel Essex, MD Authorized by: Ezequiel Essex, MD   Critical care provider statement:    Critical care time (minutes):  35   Critical care was necessary to treat or prevent imminent or life-threatening deterioration of the following conditions:  CNS failure or compromise and endocrine crisis   Critical care was time spent personally by me on the following activities:  Discussions with consultants, evaluation of patient's response to treatment, examination of patient, ordering and performing treatments and interventions, ordering and review of laboratory studies, ordering and review of radiographic studies, pulse oximetry, re-evaluation of patient's condition, obtaining history from patient or surrogate and review of old charts   (including critical care time)  Medications Ordered in ED Medications - No data to display  ED Course  I have reviewed the triage vital signs and the nursing notes.  Pertinent labs & imaging results that were available during my care of the patient were reviewed by me and considered in my medical  decision making (see chart for details).    MDM Rules/Calculators/A&P                      Dialysis patient here with leg weakness as well as incontinence.  States her "legs do not work". Patient does have weakness on exam and difficulty standing. She has multiple episodes of incontinence that are concerning potentially for cord lesion. This has been ongoing for several weeks since she was started on dialysis last month.  CT head is negative.  She does have saddle anesthesia as well and decreased rectal tone.  MRI is not available.  Neurology consult complete  with Dr. Roanna Raider.  She agrees patient needs MRI of T and L-spine to evaluate for cauda equina syndrome.  Melanie Hall is considered less likely given the length of time since onset. She recommends neurology consult if MRI does not show any surgical pathology  Given patient has new onset dialysis, will not give gadolinium for MRI. UA is concerning for infection started on Rocephin.  Culture sent.  Chest x-ray shows mild edema.  Patient did have dialysis today denies any missed sessions.  She denies any shortness of breath or chest pain. Covid swab sent.  MRI not available. D/w hospitalist Dr. Darrick Hall who states MRI should be completed before patient can be accepted hospitalist service to rule out surgical pathology.  Discussed with Dr. Dayna Hall who accepts patient for transfer to [facility]  D/w Dr. Christy Hall who is [facility]. Patient will need admission after her MRI for further evaluation of her lower extremity weakness and incontinence.  Melanie Hall was evaluated in Emergency Department on 09/10/2019 for the symptoms described in the history of present illness. She was evaluated in the context of the global COVID-19 pandemic, which necessitated consideration that the patient might be at risk for infection with the SARS-CoV-2 virus that causes COVID-19. Institutional protocols and algorithms that pertain to the evaluation of patients at risk for COVID-19 are in a state of rapid change based on information released by regulatory bodies including the CDC and federal and state organizations. These policies and algorithms were followed during the patient's care in the ED.   Final Clinical Impression(s) / ED Diagnoses Final diagnoses:  Weakness of both lower extremities  Incontinence of feces, unspecified fecal incontinence type    Rx / DC Orders ED Discharge Orders    None       Ryelle Ruvalcaba, Annie Main, MD 09/10/19 [phone]

## 2019-09-09 NOTE — ED Triage Notes (Signed)
Pt recently discharged from the hospital with home health. Pt reports that she has not gotten any better since being home, Home health is concerned because pt is not walking, pt advises that she has not walked in 6 weeks, has two areas on buttocks that home health is concerned about. Pt reports that she was recommended to go to SNF at discharge but she refused.

## 2019-09-09 NOTE — ED Notes (Signed)
Pt's daughter Gaylan Gerold [phone]) spoke with triage nurse after pt was taken back to room.  Per daughter home health came out yesterday and talked with her and pt.  Pt has not been taking her medications correctly.  Has not taken any insulin since being discharged from the hospital. Will not get up even with offer of assistance, will be incontinent of urine and stool to keep from getting up.  Home health mentioned APS to daughter yesterday due to concerns of pt. Daughter is also concerned that pt may be having some mental health issues that are contributing to some of the "not walking, not helping to do anything for herself"

## 2019-09-10 ENCOUNTER — Emergency Department (HOSPITAL_COMMUNITY): Payer: Medicaid Other

## 2019-09-10 ENCOUNTER — Other Ambulatory Visit (HOSPITAL_COMMUNITY): Payer: Self-pay

## 2019-09-10 ENCOUNTER — Other Ambulatory Visit: Payer: Self-pay

## 2019-09-10 ENCOUNTER — Inpatient Hospital Stay (HOSPITAL_COMMUNITY): Payer: Medicaid Other

## 2019-09-10 ENCOUNTER — Encounter (HOSPITAL_COMMUNITY): Payer: Self-pay | Admitting: Emergency Medicine

## 2019-09-10 DIAGNOSIS — Z833 Family history of diabetes mellitus: Secondary | ICD-10-CM | POA: Diagnosis not present

## 2019-09-10 DIAGNOSIS — N2581 Secondary hyperparathyroidism of renal origin: Secondary | ICD-10-CM | POA: Diagnosis not present

## 2019-09-10 DIAGNOSIS — E1136 Type 2 diabetes mellitus with diabetic cataract: Secondary | ICD-10-CM | POA: Diagnosis not present

## 2019-09-10 DIAGNOSIS — G629 Polyneuropathy, unspecified: Secondary | ICD-10-CM

## 2019-09-10 DIAGNOSIS — R531 Weakness: Secondary | ICD-10-CM | POA: Diagnosis present

## 2019-09-10 DIAGNOSIS — Z992 Dependence on renal dialysis: Secondary | ICD-10-CM | POA: Diagnosis not present

## 2019-09-10 DIAGNOSIS — R5381 Other malaise: Secondary | ICD-10-CM | POA: Diagnosis present

## 2019-09-10 DIAGNOSIS — L8990 Pressure ulcer of unspecified site, unspecified stage: Secondary | ICD-10-CM

## 2019-09-10 DIAGNOSIS — N3000 Acute cystitis without hematuria: Secondary | ICD-10-CM | POA: Diagnosis not present

## 2019-09-10 DIAGNOSIS — E1122 Type 2 diabetes mellitus with diabetic chronic kidney disease: Secondary | ICD-10-CM | POA: Diagnosis not present

## 2019-09-10 DIAGNOSIS — Z20822 Contact with and (suspected) exposure to covid-19: Secondary | ICD-10-CM | POA: Diagnosis not present

## 2019-09-10 DIAGNOSIS — G834 Cauda equina syndrome: Secondary | ICD-10-CM | POA: Diagnosis not present

## 2019-09-10 DIAGNOSIS — E1142 Type 2 diabetes mellitus with diabetic polyneuropathy: Secondary | ICD-10-CM | POA: Diagnosis not present

## 2019-09-10 DIAGNOSIS — D509 Iron deficiency anemia, unspecified: Secondary | ICD-10-CM | POA: Diagnosis not present

## 2019-09-10 DIAGNOSIS — E1169 Type 2 diabetes mellitus with other specified complication: Secondary | ICD-10-CM | POA: Diagnosis not present

## 2019-09-10 DIAGNOSIS — I120 Hypertensive chronic kidney disease with stage 5 chronic kidney disease or end stage renal disease: Secondary | ICD-10-CM | POA: Diagnosis not present

## 2019-09-10 DIAGNOSIS — N186 End stage renal disease: Secondary | ICD-10-CM

## 2019-09-10 DIAGNOSIS — E1165 Type 2 diabetes mellitus with hyperglycemia: Secondary | ICD-10-CM | POA: Diagnosis not present

## 2019-09-10 DIAGNOSIS — B373 Candidiasis of vulva and vagina: Secondary | ICD-10-CM | POA: Diagnosis not present

## 2019-09-10 DIAGNOSIS — E785 Hyperlipidemia, unspecified: Secondary | ICD-10-CM | POA: Diagnosis not present

## 2019-09-10 DIAGNOSIS — E039 Hypothyroidism, unspecified: Secondary | ICD-10-CM | POA: Diagnosis not present

## 2019-09-10 DIAGNOSIS — M545 Low back pain: Secondary | ICD-10-CM | POA: Diagnosis not present

## 2019-09-10 DIAGNOSIS — D472 Monoclonal gammopathy: Secondary | ICD-10-CM | POA: Diagnosis not present

## 2019-09-10 DIAGNOSIS — D631 Anemia in chronic kidney disease: Secondary | ICD-10-CM | POA: Diagnosis not present

## 2019-09-10 DIAGNOSIS — R32 Unspecified urinary incontinence: Secondary | ICD-10-CM | POA: Diagnosis present

## 2019-09-10 DIAGNOSIS — F329 Major depressive disorder, single episode, unspecified: Secondary | ICD-10-CM | POA: Diagnosis present

## 2019-09-10 DIAGNOSIS — L89151 Pressure ulcer of sacral region, stage 1: Secondary | ICD-10-CM | POA: Diagnosis not present

## 2019-09-10 DIAGNOSIS — M546 Pain in thoracic spine: Secondary | ICD-10-CM | POA: Diagnosis not present

## 2019-09-10 DIAGNOSIS — E7800 Pure hypercholesterolemia, unspecified: Secondary | ICD-10-CM | POA: Diagnosis not present

## 2019-09-10 DIAGNOSIS — I152 Hypertension secondary to endocrine disorders: Secondary | ICD-10-CM | POA: Diagnosis not present

## 2019-09-10 DIAGNOSIS — F32A Depression, unspecified: Secondary | ICD-10-CM | POA: Diagnosis present

## 2019-09-10 DIAGNOSIS — Z87891 Personal history of nicotine dependence: Secondary | ICD-10-CM | POA: Diagnosis not present

## 2019-09-10 HISTORY — DX: Weakness: R53.1

## 2019-09-10 HISTORY — DX: Pressure ulcer of unspecified site, unspecified stage: L89.90

## 2019-09-10 LAB — COMPREHENSIVE METABOLIC PANEL
ALT: 17 U/L (ref 0–44)
AST: 15 U/L (ref 15–41)
Albumin: 2.9 g/dL — ABNORMAL LOW (ref 3.5–5.0)
Alkaline Phosphatase: 154 U/L — ABNORMAL HIGH (ref 38–126)
Anion gap: 10 (ref 5–15)
BUN: 14 mg/dL (ref 6–20)
CO2: 26 mmol/L (ref 22–32)
Calcium: 8.1 mg/dL — ABNORMAL LOW (ref 8.9–10.3)
Chloride: 99 mmol/L (ref 98–111)
Creatinine, Ser: 2.45 mg/dL — ABNORMAL HIGH (ref 0.44–1.00)
GFR calc Af Amer: 26 mL/min — ABNORMAL LOW (ref >60)
GFR calc non Af Amer: 22 mL/min — ABNORMAL LOW (ref >60)
Glucose, Bld: 281 mg/dL — ABNORMAL HIGH (ref 70–99)
Potassium: 3.4 mmol/L — ABNORMAL LOW (ref 3.5–5.1)
Sodium: 135 mmol/L (ref 135–145)
Total Bilirubin: 1.2 mg/dL (ref 0.3–1.2)
Total Protein: 6.6 g/dL (ref 6.5–8.1)

## 2019-09-10 LAB — GLUCOSE, CAPILLARY
Glucose-Capillary: 178 mg/dL — ABNORMAL HIGH (ref 70–99)
Glucose-Capillary: 148 mg/dL — ABNORMAL HIGH (ref 70–99)

## 2019-09-10 LAB — CBG MONITORING, ED: Glucose-Capillary: 201 mg/dL — ABNORMAL HIGH (ref 70–99)

## 2019-09-10 LAB — LACTIC ACID, PLASMA: Lactic Acid, Venous: 0.8 mmol/L (ref 0.5–1.9)

## 2019-09-10 LAB — C-REACTIVE PROTEIN: CRP: 5 mg/dL — ABNORMAL HIGH (ref <1.0)

## 2019-09-10 LAB — URINALYSIS, ROUTINE W REFLEX MICROSCOPIC
Bilirubin Urine: NEGATIVE
Glucose, UA: 250 mg/dL — AB
Ketones, ur: NEGATIVE mg/dL
Nitrite: NEGATIVE
Protein, ur: >300 mg/dL — AB
Specific Gravity, Urine: >1.03 — ABNORMAL HIGH (ref 1.005–1.030)
pH: 6 (ref 5.0–8.0)

## 2019-09-10 LAB — CBC WITH DIFFERENTIAL/PLATELET
Abs Immature Granulocytes: 0.04 10*3/uL (ref 0.00–0.07)
Basophils Absolute: 0.1 10*3/uL (ref 0.0–0.1)
Basophils Relative: 1 %
Eosinophils Absolute: 0.3 10*3/uL (ref 0.0–0.5)
Eosinophils Relative: 3 %
HCT: 32.3 % — ABNORMAL LOW (ref 36.0–46.0)
Hemoglobin: 10 g/dL — ABNORMAL LOW (ref 12.0–15.0)
Immature Granulocytes: 0 %
Lymphocytes Relative: 16 %
Lymphs Abs: 1.5 10*3/uL (ref 0.7–4.0)
MCH: 31.2 pg (ref 26.0–34.0)
MCHC: 31 g/dL (ref 30.0–36.0)
MCV: 100.6 fL — ABNORMAL HIGH (ref 80.0–100.0)
Monocytes Absolute: 0.8 10*3/uL (ref 0.1–1.0)
Monocytes Relative: 8 %
Neutro Abs: 6.7 10*3/uL (ref 1.7–7.7)
Neutrophils Relative %: 72 %
Platelets: 171 10*3/uL (ref 150–400)
RBC: 3.21 MIL/uL — ABNORMAL LOW (ref 3.87–5.11)
RDW: 16.6 % — ABNORMAL HIGH (ref 11.5–15.5)
WBC: 9.4 10*3/uL (ref 4.0–10.5)
nRBC: 0 % (ref 0.0–0.2)

## 2019-09-10 LAB — HEMOGLOBIN A1C
Hgb A1c MFr Bld: 7.2 % — ABNORMAL HIGH (ref 4.8–5.6)
Mean Plasma Glucose: 159.94 mg/dL

## 2019-09-10 LAB — RESPIRATORY PANEL BY RT PCR (FLU A&B, COVID)
Influenza A by PCR: NEGATIVE
Influenza B by PCR: NEGATIVE
SARS Coronavirus 2 by RT PCR: NEGATIVE

## 2019-09-10 LAB — TSH: TSH: 3.242 u[IU]/mL (ref 0.350–4.500)

## 2019-09-10 LAB — URINALYSIS, MICROSCOPIC (REFLEX): WBC, UA: >50 WBC/hpf (ref 0–5)

## 2019-09-10 LAB — VITAMIN B12: Vitamin B-12: 818 pg/mL (ref 180–914)

## 2019-09-10 LAB — SEDIMENTATION RATE: Sed Rate: 45 mm/hr — ABNORMAL HIGH (ref 0–22)

## 2019-09-10 IMAGING — MR MR CERVICAL SPINE W/O CM
4 of 6 series · 22 of 48 positions shown · non-contrast
Comparison: CT of the cervical spine [DATE]

CLINICAL DATA: Neuro deficit, acute, stroke suspected. Bilateral
lower extremity weakness. Bladder stool contents. Symptoms 4 weeks.
End-stage renal disease. Hospitalization approximately 1 month ago.

EXAM:
MRI CERVICAL SPINE WITHOUT CONTRAST
TECHNIQUE: Multiplanar, multisequence MR imaging of the cervical spine was
performed. No intravenous contrast was administered.

[Series 2: T2 · sagittal · 3.0mm · 0.43mm/px · 4 of 13 slices shown (1 of 2)]
[im 1/13]
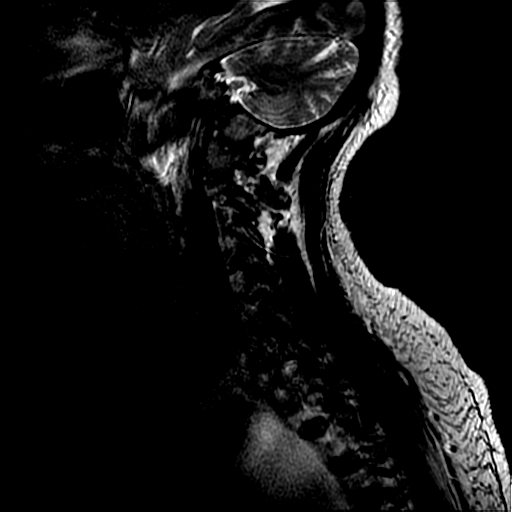
[im 5/13]
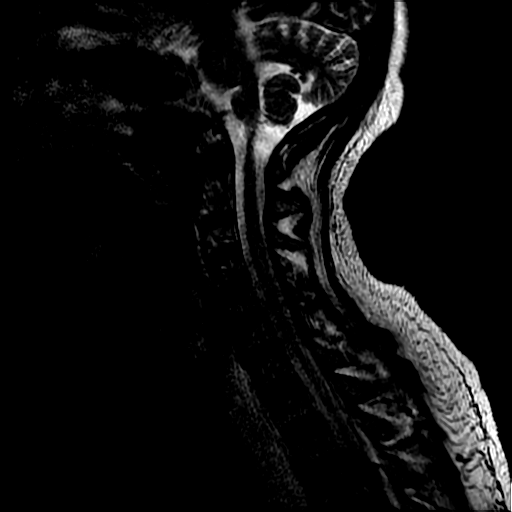
[im 9/13]
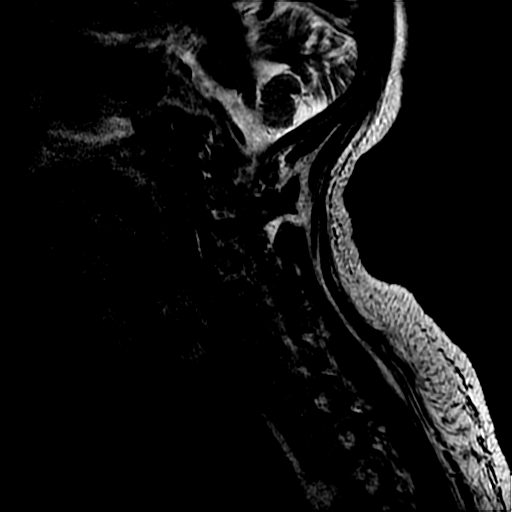
[im 13/13]
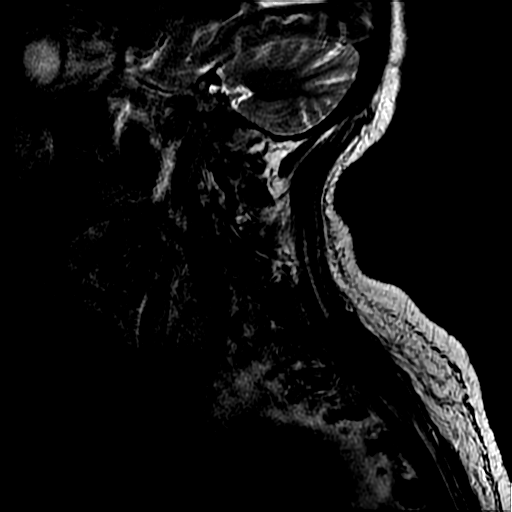

[Series 3: T1 · sagittal · 3.0mm · 0.86mm/px · 5 of 13 slices shown (1 of 2)]
[im 1/13]
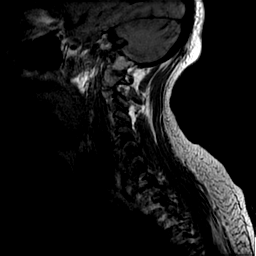
[im 4/13]
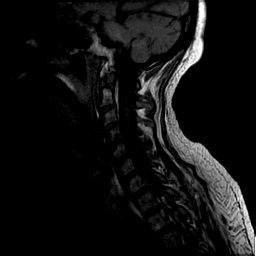
[im 7/13]
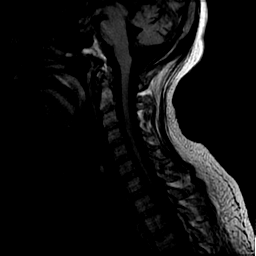
[im 10/13]
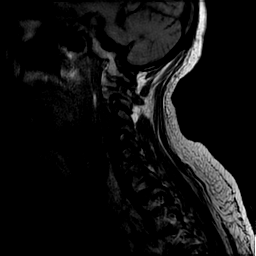
[im 13/13]
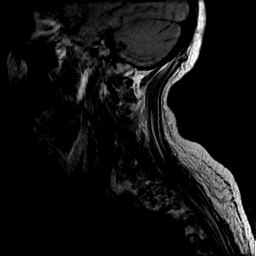

[Series 5: T2 · axial · 3.0mm · 0.39mm/px · z∈[-78,+22]mm · 9 of 30 slices shown (2 of 2)]
[im 1/30]
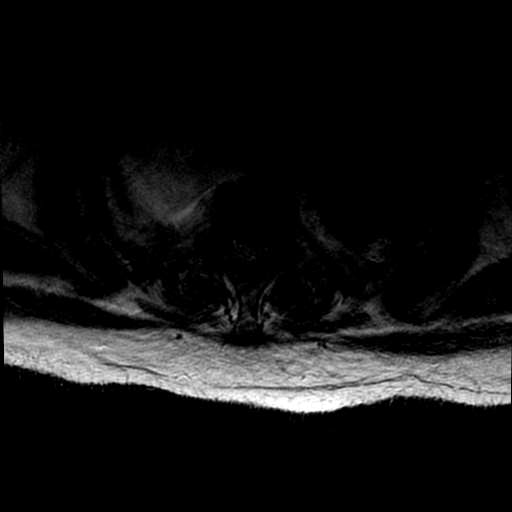
[im 6/30]
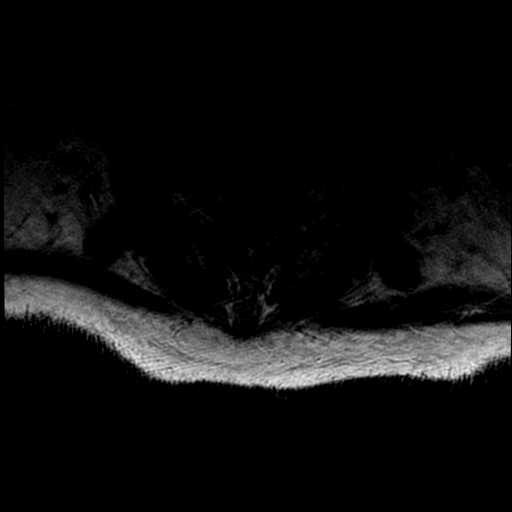
[im 8/30]
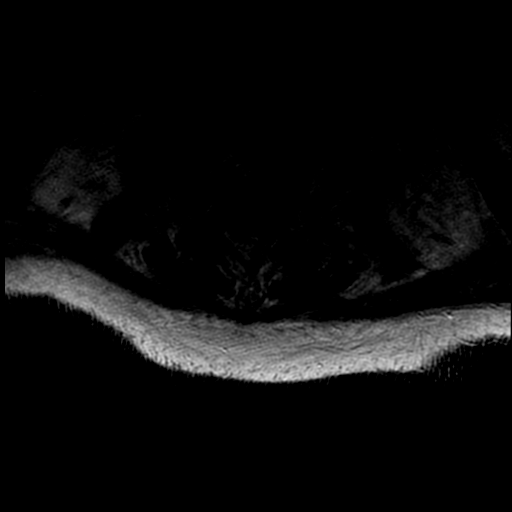
[im 14/30]
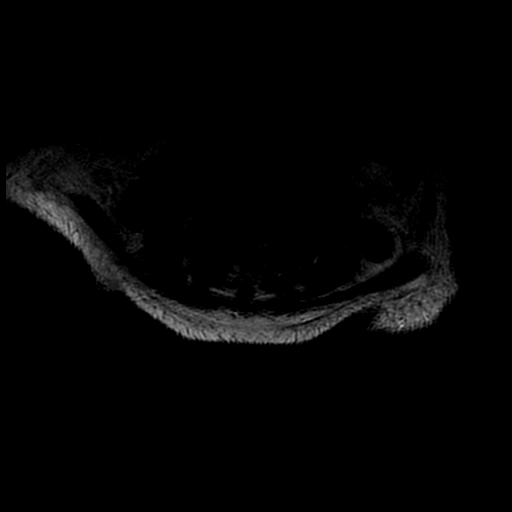
[im 16/30]
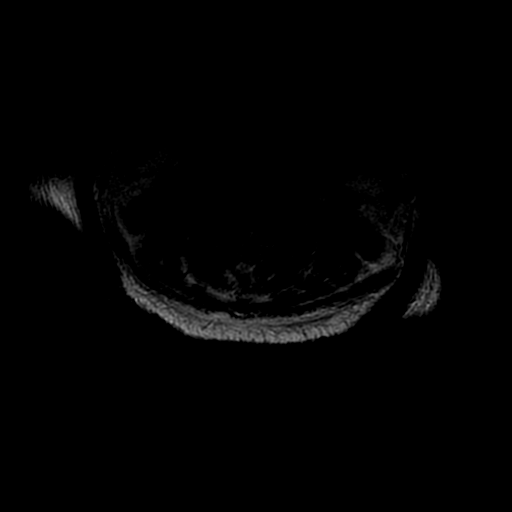
[im 22/30]
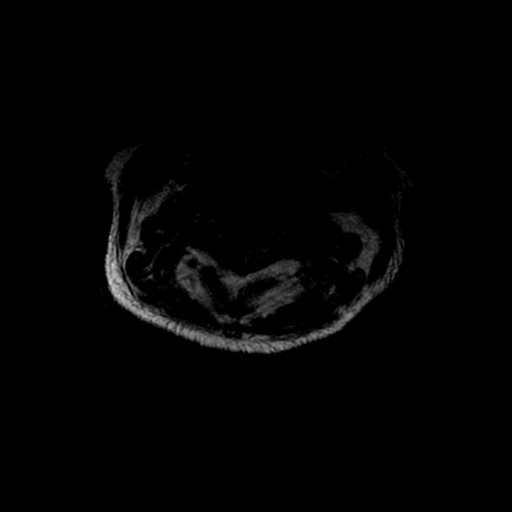
[im 24/30]
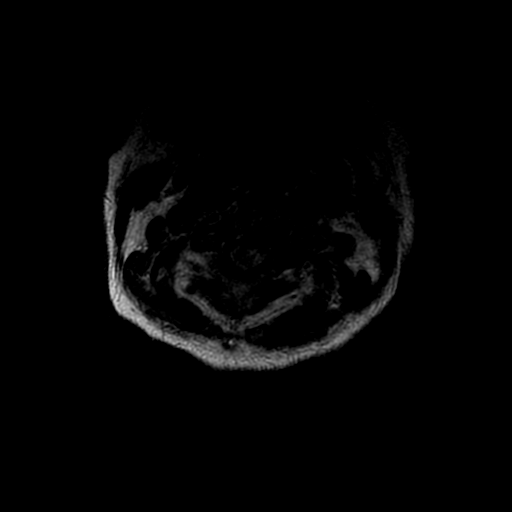
[im 27/30]
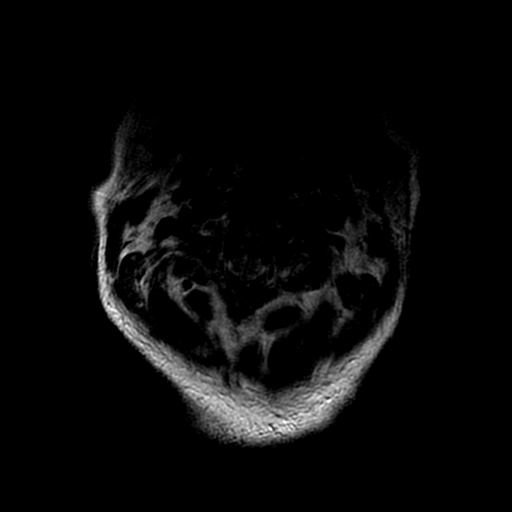
[im 30/30]
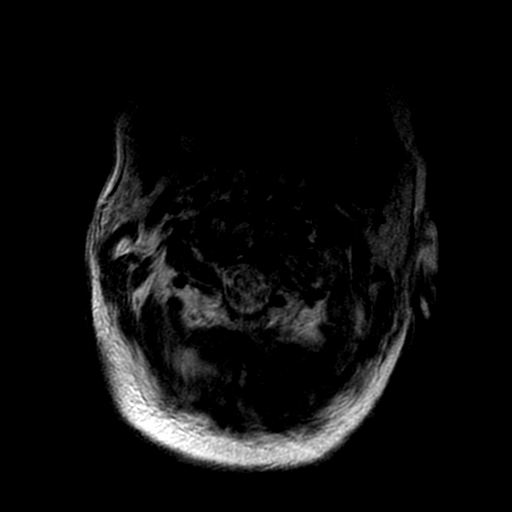

[Series 7: T1 · axial · non-contrast · 3.0mm · 0.39mm/px · z∈[-83,+12]mm · 4 of 30 slices shown (2 of 2)]
[im 1/30]
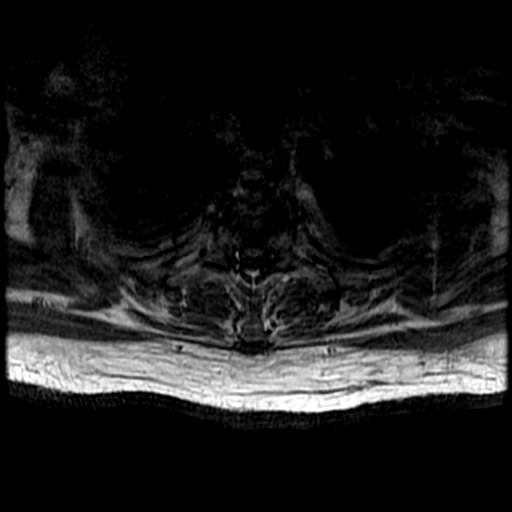
[im 6/30]
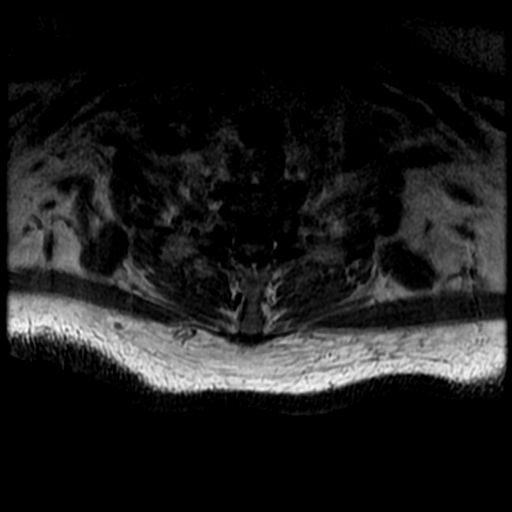
[im 16/30]
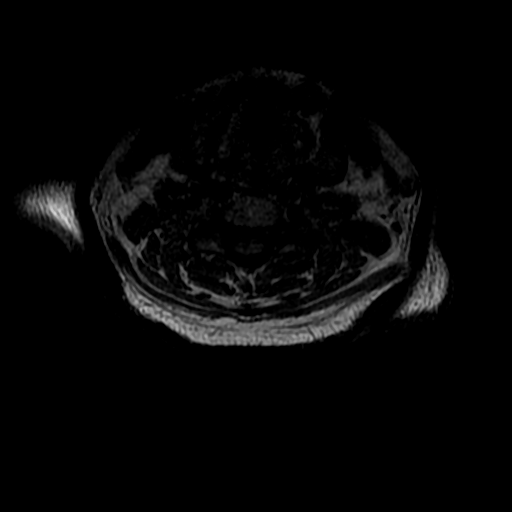
[im 27/30]
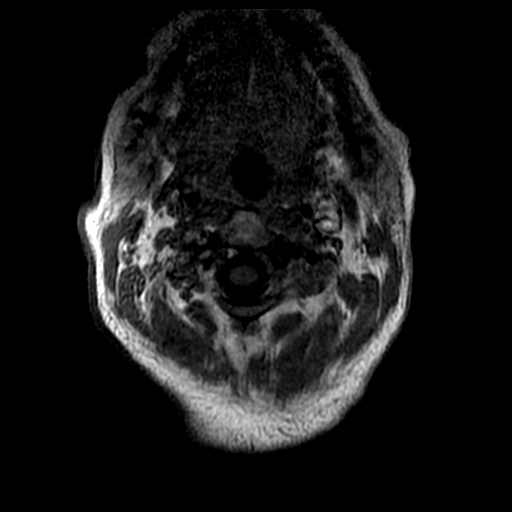

[22 of 48 positions shown; findings below may reference images not displayed]

FINDINGS: Alignment: Significant listhesis is present. There is some
straightening of the normal cervical lordosis.

Vertebrae: Marrow signal and vertebral body heights are normal.

Cord: The study is mildly degraded by patient motion. No abnormal
cord signal or morphology is present. Signal changes on the T2
sagittal sequence are not present on the axial or T2 STIR sequence.

Posterior Fossa, vertebral arteries, paraspinal tissues:
Craniocervical junction is normal. Flow is present in the vertebral
arteries bilaterally. Visualized intracranial contents are normal.

Disc levels:

No significant cervical disc protrusion or stenosis is present. The
central canal and foramina are patent.
IMPRESSION: 1. No acute or focal lesion to explain the patient's symptoms.
2. Straightening of the normal cervical lordosis is nonspecific, but
can be seen in the setting of muscle strain or ongoing pain.

## 2019-09-10 MED ORDER — CHLORHEXIDINE GLUCONATE CLOTH 2 % EX PADS
6.0000 | MEDICATED_PAD | Freq: Every day | CUTANEOUS | Status: DC
  Administered 2019-09-11 – 2019-09-17 (×7): 6 via TOPICAL

## 2019-09-10 MED ORDER — ZOLPIDEM TARTRATE 5 MG PO TABS
5.0000 mg | ORAL_TABLET | Freq: Every evening | ORAL | Status: DC | PRN
  Administered 2019-09-11 – 2019-09-16 (×6): 5 mg via ORAL
  Filled 2019-09-10 (×6): qty 1

## 2019-09-10 MED ORDER — HYDROXYZINE HCL 25 MG PO TABS
25.0000 mg | ORAL_TABLET | Freq: Three times a day (TID) | ORAL | Status: DC | PRN
  Administered 2019-09-16: 25 mg via ORAL
  Filled 2019-09-10: qty 1

## 2019-09-10 MED ORDER — AMLODIPINE BESYLATE 5 MG PO TABS
5.0000 mg | ORAL_TABLET | Freq: Every day | ORAL | Status: DC
  Administered 2019-09-10 – 2019-09-19 (×9): 5 mg via ORAL
  Filled 2019-09-10 (×8): qty 1

## 2019-09-10 MED ORDER — ACETAMINOPHEN 325 MG PO TABS
650.0000 mg | ORAL_TABLET | Freq: Four times a day (QID) | ORAL | Status: DC | PRN
  Administered 2019-09-13: 650 mg via ORAL
  Filled 2019-09-10: qty 2

## 2019-09-10 MED ORDER — RENA-VITE PO TABS
1.0000 | ORAL_TABLET | Freq: Every day | ORAL | Status: DC
  Administered 2019-09-10 – 2019-09-18 (×9): 1 via ORAL
  Filled 2019-09-10 (×10): qty 1

## 2019-09-10 MED ORDER — GABAPENTIN 100 MG PO CAPS
100.0000 mg | ORAL_CAPSULE | Freq: Two times a day (BID) | ORAL | Status: DC
  Administered 2019-09-10: 100 mg via ORAL
  Filled 2019-09-10: qty 1

## 2019-09-10 MED ORDER — ONDANSETRON HCL 4 MG PO TABS: 4.0000 mg | ORAL_TABLET | Freq: Four times a day (QID) | ORAL | Status: DC | PRN

## 2019-09-10 MED ORDER — DOCUSATE SODIUM 283 MG RE ENEM
1.0000 | ENEMA | RECTAL | Status: DC | PRN
  Filled 2019-09-10: qty 1

## 2019-09-10 MED ORDER — ACETAMINOPHEN 650 MG RE SUPP: 650.0000 mg | Freq: Four times a day (QID) | RECTAL | Status: DC | PRN

## 2019-09-10 MED ORDER — SODIUM CHLORIDE 0.9 % IV SOLN
1.0000 g | Freq: Once | INTRAVENOUS | Status: AC
  Administered 2019-09-11: 1 g via INTRAVENOUS
  Filled 2019-09-10: qty 1

## 2019-09-10 MED ORDER — LEVOTHYROXINE SODIUM 25 MCG PO TABS
25.0000 ug | ORAL_TABLET | Freq: Every day | ORAL | Status: DC
  Administered 2019-09-10 – 2019-09-19 (×9): 25 ug via ORAL
  Filled 2019-09-10 (×9): qty 1

## 2019-09-10 MED ORDER — LORAZEPAM 2 MG/ML IJ SOLN
2.0000 mg | Freq: Once | INTRAMUSCULAR | Status: AC
  Administered 2019-09-10: 2 mg via INTRAVENOUS
  Filled 2019-09-10: qty 1

## 2019-09-10 MED ORDER — CAMPHOR-MENTHOL 0.5-0.5 % EX LOTN
1.0000 "application " | TOPICAL_LOTION | Freq: Three times a day (TID) | CUTANEOUS | Status: DC | PRN
  Filled 2019-09-10: qty 222

## 2019-09-10 MED ORDER — PRAVASTATIN SODIUM 40 MG PO TABS
40.0000 mg | ORAL_TABLET | Freq: Every day | ORAL | Status: DC
  Administered 2019-09-10 – 2019-09-13 (×3): 40 mg via ORAL
  Filled 2019-09-10 (×5): qty 1

## 2019-09-10 MED ORDER — SORBITOL 70 % SOLN
30.0000 mL | Status: DC | PRN
  Filled 2019-09-10: qty 30

## 2019-09-10 MED ORDER — HEPARIN SODIUM (PORCINE) 5000 UNIT/ML IJ SOLN
5000.0000 [IU] | Freq: Three times a day (TID) | INTRAMUSCULAR | Status: DC
  Administered 2019-09-10 – 2019-09-19 (×22): 5000 [IU] via SUBCUTANEOUS
  Filled 2019-09-10 (×22): qty 1

## 2019-09-10 MED ORDER — ONDANSETRON HCL 4 MG/2ML IJ SOLN
INTRAMUSCULAR | Status: AC
  Administered 2019-09-10: 4 mg via INTRAVENOUS
  Filled 2019-09-10: qty 2

## 2019-09-10 MED ORDER — FENTANYL CITRATE (PF) 100 MCG/2ML IJ SOLN
100.0000 ug | Freq: Once | INTRAMUSCULAR | Status: AC
  Administered 2019-09-10: 100 ug via INTRAVENOUS
  Filled 2019-09-10: qty 2

## 2019-09-10 MED ORDER — LORAZEPAM 2 MG/ML IJ SOLN
1.0000 mg | Freq: Once | INTRAMUSCULAR | Status: AC
  Administered 2019-09-10: 1 mg via INTRAVENOUS
  Filled 2019-09-10: qty 1

## 2019-09-10 MED ORDER — INSULIN ASPART 100 UNIT/ML ~~LOC~~ SOLN
0.0000 [IU] | Freq: Every day | SUBCUTANEOUS | Status: DC
  Administered 2019-09-14: 1 [IU] via SUBCUTANEOUS
  Administered 2019-09-15: 5 [IU] via SUBCUTANEOUS
  Administered 2019-09-16: 2 [IU] via SUBCUTANEOUS

## 2019-09-10 MED ORDER — PANTOPRAZOLE SODIUM 40 MG PO TBEC
40.0000 mg | DELAYED_RELEASE_TABLET | Freq: Every day | ORAL | Status: DC
  Administered 2019-09-10 – 2019-09-19 (×10): 40 mg via ORAL
  Filled 2019-09-10 (×10): qty 1

## 2019-09-10 MED ORDER — CARVEDILOL 3.125 MG PO TABS
3.1250 mg | ORAL_TABLET | Freq: Two times a day (BID) | ORAL | Status: DC
  Administered 2019-09-10 – 2019-09-12 (×3): 3.125 mg via ORAL
  Filled 2019-09-10 (×4): qty 1

## 2019-09-10 MED ORDER — ONDANSETRON HCL 4 MG/2ML IJ SOLN
4.0000 mg | Freq: Four times a day (QID) | INTRAMUSCULAR | Status: DC | PRN
  Administered 2019-09-12 – 2019-09-18 (×2): 4 mg via INTRAVENOUS
  Filled 2019-09-10 (×2): qty 2

## 2019-09-10 MED ORDER — SERTRALINE HCL 50 MG PO TABS
25.0000 mg | ORAL_TABLET | Freq: Every day | ORAL | Status: DC
  Administered 2019-09-10 – 2019-09-19 (×10): 25 mg via ORAL
  Filled 2019-09-10 (×10): qty 1

## 2019-09-10 MED ORDER — GABAPENTIN 300 MG PO CAPS
300.0000 mg | ORAL_CAPSULE | Freq: Two times a day (BID) | ORAL | Status: DC
  Administered 2019-09-10 – 2019-09-14 (×8): 300 mg via ORAL
  Filled 2019-09-10 (×8): qty 1

## 2019-09-10 MED ORDER — INSULIN ASPART 100 UNIT/ML ~~LOC~~ SOLN
0.0000 [IU] | Freq: Three times a day (TID) | SUBCUTANEOUS | Status: DC
  Administered 2019-09-10: 2 [IU] via SUBCUTANEOUS
  Administered 2019-09-10 – 2019-09-11 (×2): 1 [IU] via SUBCUTANEOUS
  Administered 2019-09-12: 2 [IU] via SUBCUTANEOUS
  Administered 2019-09-13: 1 [IU] via SUBCUTANEOUS
  Administered 2019-09-15 – 2019-09-16 (×2): 2 [IU] via SUBCUTANEOUS
  Administered 2019-09-16: 1 [IU] via SUBCUTANEOUS
  Administered 2019-09-16: 4 [IU] via SUBCUTANEOUS
  Administered 2019-09-17 – 2019-09-18 (×2): 2 [IU] via SUBCUTANEOUS

## 2019-09-10 MED ORDER — CALCIUM CARBONATE ANTACID 1250 MG/5ML PO SUSP
500.0000 mg | Freq: Four times a day (QID) | ORAL | Status: DC | PRN
  Filled 2019-09-10: qty 5

## 2019-09-10 MED ORDER — SODIUM CHLORIDE 0.9 % IV SOLN
1.0000 g | Freq: Once | INTRAVENOUS | Status: AC
  Administered 2019-09-10: 1 g via INTRAVENOUS
  Filled 2019-09-10: qty 10

## 2019-09-10 MED ORDER — NEPRO/CARBSTEADY PO LIQD
237.0000 mL | Freq: Three times a day (TID) | ORAL | Status: DC | PRN
  Filled 2019-09-10: qty 237

## 2019-09-10 MED ORDER — ONDANSETRON HCL 4 MG/2ML IJ SOLN: 4.0000 mg | Freq: Once | INTRAMUSCULAR | Status: AC

## 2019-09-10 NOTE — Consult Note (Signed)
Telepsych Consultation   Reason for Consult:  "depression" Referring Physician:  Dr Lorin Mercy Location of Patient:  [name] [phone] Location of Provider: [facility]  Patient Identification: Melanie Hall MRN:  [id number] Principal Diagnosis: Debility Diagnosis:  Principal Problem:   Debility Active Problems:   Type 2 diabetes with nephropathy ([place])   HYPERCHOLESTEROLEMIA   Hypertension associated with diabetes ([place])   MGUS (monoclonal gammopathy of unknown significance)   ESRD (end stage renal disease) on dialysis ([place])   Depression   Incontinence in female   Pressure ulcer   Total Time spent with patient: 30 minutes  Subjective:   Melanie Hall is a 49 y.o. female patient.  Patient assessed by nurse practitioner.  Patient alert and oriented, answers appropriately.  Patient observed reclined on hospital stretcher wearing hospital gown. Patient states "I am here because my legs are not working and have not been for 4 weeks." Patient denies suicidal ideations.  Patient denies history of suicide attempts, denies history of self-harm.  Patient denies homicidal ideations.  Patient denies auditory visual hallucinations.  Patient denies symptoms of paranoia.  Patient denies history of mental illness.  Patient denies family history of mental illness. Patient reports average 10 hours of sleep per night.  Patient reports "sometimes I cannot sleep and I only sleep for 5 hours."  Patient reports decreased appetite states "I do not have an appetite for months now." Patient reports she lives in [place] with a roommate.  Patient denies alcohol and substance use.  Patient denies weapons in home.  Patient denies outpatient psychiatrist. Patient gives verbal consent to speak with her daughter Melanie Hall phone number [phone] Spoke with patient's daughter Melanie Hall: Patient's daughter denies history of mental illness for her mother.  Patient's daughter denies any knowledge of alcohol or  substance use.  Patient's daughter denies any knowledge of weapons at home.  Patient's daughter states "she has been sick for over a year and I have tried to get her to live with me but she does not want to."  Patient's daughter reports concerns about patient's diet, states "her roommate still brings her [facility] and Janine Limbo and she has been drinking sodas when she is not supposed to be." Patient's daughter reports "she stays on the couch and does not take her medications like she is supposed to and does not eat like she is supposed to." Patient's daughter reports "I believe that mom needs some type of placement but I know that she will go of her own well." Patient's daughter reports advanced home health care comes out to assist patient approximately 4 times per week.  HPI: Patient admitted with primary complaint of weakness.  Past Psychiatric History: Depression  Risk to Self:  Denies Risk to Others:  Denies Prior Inpatient Therapy:  Denies Prior Outpatient Therapy:  Denies  Past Medical History:  Past Medical History:  Diagnosis Date  . Anemia   . Cataracts, bilateral    surgery to remove  . Diabetes mellitus    type 2  . ESRD on hemodialysis ([place])   . GERD (gastroesophageal reflux disease)   . Hypertension   . SVD (spontaneous vaginal delivery)    x 4    Past Surgical History:  Procedure Laterality Date  . AV FISTULA PLACEMENT Left 08/17/2019   Procedure: LEFT BRACHIAL CEPHALIC ARTERIOVENOUS (AV) FISTULA;  Surgeon: Angelia Mould, MD;  Location: [place];  Service: Vascular;  Laterality: Left;  . EYE SURGERY Bilateral    cataracts removed  .  FEMUR IM NAIL Left 10/28/2018   Procedure: RETROGRADE FEMORAL NAILING;  Surgeon: Meredith Pel, MD;  Location: [place];  Service: Orthopedics;  Laterality: Left;  . IM NAILING FEMORAL SHAFT RETROGRADE Left 10/28/2018  . IR FLUORO GUIDE CV LINE RIGHT  08/11/2019  . IR US GUIDE VASC ACCESS RIGHT  08/11/2019  . KNEE SURGERY    . TUBAL  LIGATION     Family History:  Family History  Problem Relation Age of Onset  . Diabetes Mother   . Hypertension Mother   . Bipolar disorder Mother   . Brain cancer Maternal Aunt   . Heart disease Maternal Grandmother   . Diabetes Maternal Grandmother   . Breast cancer Maternal Aunt   . Depression Father        Committed suicide  . Diabetes Sister    Family Psychiatric  History: Denies Social History:  Social History   Substance and Sexual Activity  Alcohol Use No     Social History   Substance and Sexual Activity  Drug Use No    Social History   Socioeconomic History  . Marital status: Widowed    Spouse name: Not on file  . Number of children: 4  . Years of education: Not on file  . Highest education level: Not on file  Occupational History  . Not on file  Tobacco Use  . Smoking status: Former Smoker    Types: Cigarettes    Quit date: 1997    Years since quitting: 24.1  . Smokeless tobacco: Never Used  Substance and Sexual Activity  . Alcohol use: No  . Drug use: No  . Sexual activity: Yes    Birth control/protection: Surgical  Other Topics Concern  . Not on file  Social History Narrative  . Not on file   Social Determinants of Health   Financial Resource Strain:   . Difficulty of Paying Living Expenses: Not on file  Food Insecurity:   . Worried About Charity fundraiser in the Last Year: Not on file  . Ran Out of Food in the Last Year: Not on file  Transportation Needs:   . Lack of Transportation (Medical): Not on file  . Lack of Transportation (Non-Medical): Not on file  Physical Activity:   . Days of Exercise per Week: Not on file  . Minutes of Exercise per Session: Not on file  Stress:   . Feeling of Stress : Not on file  Social Connections:   . Frequency of Communication with Friends and Family: Not on file  . Frequency of Social Gatherings with Friends and Family: Not on file  . Attends Religious Services: Not on file  . Active Member of  Clubs or Organizations: Not on file  . Attends Archivist Meetings: Not on file  . Marital Status: Not on file   Additional Social History:    Allergies:  No Known Allergies  Labs:  Results for orders placed or performed during the hospital encounter of 09/09/19 (from the past 48 hour(s))  POC occult blood, ED     Status: None   Collection Time: 09/09/19 11:57 PM  Result Value Ref Range   Fecal Occult Bld NEGATIVE NEGATIVE  CBC with Differential/Platelet     Status: Abnormal   Collection Time: 09/10/19 12:21 AM  Result Value Ref Range   WBC 9.4 4.0 - 10.5 K/uL   RBC 3.21 (L) 3.87 - 5.11 MIL/uL   Hemoglobin 10.0 (L) 12.0 - 15.0 g/dL  HCT 32.3 (L) 36.0 - 46.0 %   MCV 100.6 (H) 80.0 - 100.0 fL   MCH 31.2 26.0 - 34.0 pg   MCHC 31.0 30.0 - 36.0 g/dL   RDW 16.6 (H) 11.5 - 15.5 %   Platelets 171 150 - 400 K/uL   nRBC 0.0 0.0 - 0.2 %   Neutrophils Relative % 72 %   Neutro Abs 6.7 1.7 - 7.7 K/uL   Lymphocytes Relative 16 %   Lymphs Abs 1.5 0.7 - 4.0 K/uL   Monocytes Relative 8 %   Monocytes Absolute 0.8 0.1 - 1.0 K/uL   Eosinophils Relative 3 %   Eosinophils Absolute 0.3 0.0 - 0.5 K/uL   Basophils Relative 1 %   Basophils Absolute 0.1 0.0 - 0.1 K/uL   Immature Granulocytes 0 %   Abs Immature Granulocytes 0.04 0.00 - 0.07 K/uL    Comment: Performed at [facility], [address], [place], [place] [address]  Comprehensive metabolic panel     Status: Abnormal   Collection Time: 09/10/19 12:21 AM  Result Value Ref Range   Sodium 135 135 - 145 mmol/L   Potassium 3.4 (L) 3.5 - 5.1 mmol/L   Chloride 99 98 - 111 mmol/L   CO2 26 22 - 32 mmol/L   Glucose, Bld 281 (H) 70 - 99 mg/dL    Comment: Glucose reference range applies only to samples taken after fasting for at least 8 hours.   BUN 14 6 - 20 mg/dL   Creatinine, Ser 2.45 (H) 0.44 - 1.00 mg/dL   Calcium 8.1 (L) 8.9 - 10.3 mg/dL   Total Protein 6.6 6.5 - 8.1 g/dL   Albumin 2.9 (L) 3.5 - 5.0 g/dL   AST 15 15 - 41 U/L    ALT 17 0 - 44 U/L   Alkaline Phosphatase 154 (H) 38 - 126 U/L   Total Bilirubin 1.2 0.3 - 1.2 mg/dL   GFR calc non Af Amer 22 (L) >60 mL/min   GFR calc Af Amer 26 (L) >60 mL/min   Anion gap 10 5 - 15    Comment: Performed at [facility], [address] [place], [place] [address]  Lactic acid, plasma     Status: None   Collection Time: 09/10/19 12:21 AM  Result Value Ref Range   Lactic Acid, Venous 0.8 0.5 - 1.9 mmol/L    Comment: Performed at [facility], [address] [place], [place] [address]  Blood culture (routine x 2)     Status: None (Preliminary result)   Collection Time: 09/10/19 12:22 AM   Specimen: BLOOD  Result Value Ref Range   Specimen Description BLOOD BLOOD RIGHT WRIST    Special Requests      BOTTLES DRAWN AEROBIC AND ANAEROBIC Blood Culture adequate volume   Culture      NO GROWTH < 12 HOURS Performed at [facility], [address] [place], [place] [address]    Report Status PENDING   Blood culture (routine x 2)     Status: None (Preliminary result)   Collection Time: 09/10/19 12:28 AM   Specimen: BLOOD  Result Value Ref Range   Specimen Description BLOOD RIGHT ANTECUBITAL    Special Requests      BOTTLES DRAWN AEROBIC AND ANAEROBIC Blood Culture adequate volume   Culture      NO GROWTH < 12 HOURS Performed at [facility], [address], [place],  [address]    Report Status PENDING   Urinalysis, Routine w reflex  microscopic     Status: Abnormal   Collection Time: 09/10/19  1:28 AM  Result Value Ref Range   Color, Urine YELLOW YELLOW   APPearance TURBID (A) CLEAR   Specific Gravity, Urine >1.030 (H) 1.005 - 1.030   pH 6.0 5.0 - 8.0   Glucose, UA 250 (A) NEGATIVE mg/dL   Hgb urine dipstick LARGE (A) NEGATIVE   Bilirubin Urine NEGATIVE NEGATIVE   Ketones, ur NEGATIVE NEGATIVE mg/dL   Protein, ur >300 (A) NEGATIVE mg/dL   Nitrite NEGATIVE NEGATIVE   Leukocytes,Ua LARGE (A) NEGATIVE    Comment: Performed at [facility], [address], [place], [place] [address]  Urinalysis, Microscopic (reflex)     Status: Abnormal   Collection Time: 09/10/19  1:28 AM  Result Value Ref Range   RBC / HPF 6-10 0 - 5 RBC/hpf   WBC, UA >50 0 - 5 WBC/hpf   Bacteria, UA MANY (A) NONE SEEN   Squamous Epithelial / LPF 6-10 0 - 5    Comment: Performed at [facility], [address] [place],  [address]  Respiratory Panel by RT PCR (Flu A&B, Covid) - Nasopharyngeal Swab     Status: None   Collection Time: 09/10/19  1:29 AM   Specimen: Nasopharyngeal Swab  Result Value Ref Range   SARS Coronavirus 2 by RT PCR NEGATIVE NEGATIVE    Comment: (NOTE) SARS-CoV-2 target nucleic acids are NOT DETECTED. The SARS-CoV-2 RNA is generally detectable in upper respiratoy specimens during the acute phase of infection. The lowest concentration of SARS-CoV-2 viral copies this assay can detect is 131 copies/mL. A negative result does not preclude SARS-Cov-2 infection and should not be used as the sole basis for treatment or other patient management decisions. A negative result may occur with  improper specimen collection/handling, submission of specimen other than nasopharyngeal swab, presence of viral mutation(s) within the areas targeted by this assay, and inadequate number of viral copies (<131 copies/mL). A negative result must be combined with clinical observations, patient history, and epidemiological information. The expected result is Negative. Fact Sheet for Patients:  PinkCheek.be Fact Sheet for Healthcare Providers:  GravelBags.it This test is not yet ap proved or cleared by the [place] FDA and  has been authorized for detection and/or diagnosis of SARS-CoV-2 by FDA under an Emergency Use Authorization (EUA). This EUA will remain  in effect (meaning this test can be used) for the duration of the COVID-19 declaration under Section 564(b)(1) of the Act, 21 U.S.C. section  360bbb-3(b)(1), unless the authorization is terminated or revoked sooner.    Influenza A by PCR NEGATIVE NEGATIVE   Influenza B by PCR NEGATIVE NEGATIVE    Comment: (NOTE) The Xpert Xpress SARS-CoV-2/FLU/RSV assay is intended as an aid in  the diagnosis of influenza from Nasopharyngeal swab specimens and  should not be used as a sole basis for treatment. Nasal washings and  aspirates are unacceptable for Xpert Xpress SARS-CoV-2/FLU/RSV  testing. Fact Sheet for Patients: PinkCheek.be Fact Sheet for Healthcare Providers: GravelBags.it This test is not yet approved or cleared by the [place] FDA and  has been authorized for detection and/or diagnosis of SARS-CoV-2 by  FDA under an Emergency Use Authorization (EUA). This EUA will remain  in effect (meaning this test can be used) for the duration of the  Covid-19 declaration under Section 564(b)(1) of the Act, 21  U.S.C. section 360bbb-3(b)(1), unless the authorization is  terminated or revoked. Performed at [facility], [address] [place],  [place] [address]     Medications:  Current Facility-Administered Medications  Medication Dose Route Frequency Provider Last Rate Last Admin  . acetaminophen (TYLENOL) tablet 650 mg  650 mg Oral Q6H PRN Karmen Bongo, MD       Or  . acetaminophen (TYLENOL) suppository 650 mg  650 mg Rectal Q6H PRN Karmen Bongo, MD      . amLODipine (NORVASC) tablet 5 mg  5 mg Oral Daily Karmen Bongo, MD      . calcium carbonate (dosed in mg elemental calcium) suspension 500 mg of elemental calcium  500 mg of elemental calcium Oral Q6H PRN Karmen Bongo, MD      . camphor[facility]) lotion 1 application  1 application Topical N8M PRN Karmen Bongo, MD       And  . hydrOXYzine (ATARAX/VISTARIL) tablet 25 mg  25 mg Oral Q8H PRN Karmen Bongo, MD      . carvedilol (COREG) tablet 3.125 mg  3.125 mg Oral BID WC Karmen Bongo, MD       . Derrill Memo ON 09/11/2019] cefTRIAXone (ROCEPHIN) 1 g in sodium chloride 0.9 % 100 mL IVPB  1 g Intravenous Once Karmen Bongo, MD      . Chlorhexidine Gluconate Cloth 2 % PADS 6 each  6 each Topical Q0600 Karmen Bongo, MD      . docusate sodium [facility]) enema 283 mg  1 enema Rectal PRN Karmen Bongo, MD      . feeding supplement (NEPRO CARB STEADY) liquid 237 mL  237 mL Oral TID PRN Karmen Bongo, MD      . gabapentin (NEURONTIN) capsule 100 mg  100 mg Oral BID Karmen Bongo, MD      . heparin injection 5,000 Units  5,000 Units Subcutaneous Camelia Phenes Karmen Bongo, MD      . insulin aspart (novoLOG) injection 0-5 Units  0-5 Units Subcutaneous QHS Karmen Bongo, MD      . insulin aspart (novoLOG) injection 0-6 Units  0-6 Units Subcutaneous TID WC Karmen Bongo, MD      . levothyroxine (SYNTHROID) tablet 25 mcg  25 mcg Oral QAC breakfast Karmen Bongo, MD      . multivitamin (RENA-VIT) tablet 1 tablet  1 tablet Oral QHS Karmen Bongo, MD      . ondansetron [facility]) tablet 4 mg  4 mg Oral Q6H PRN Karmen Bongo, MD       Or  . ondansetron [facility]) injection 4 mg  4 mg Intravenous Q6H PRN Karmen Bongo, MD      . pantoprazole (PROTONIX) EC tablet 40 mg  40 mg Oral Daily Karmen Bongo, MD      . pravastatin (PRAVACHOL) tablet 40 mg  40 mg Oral q1800 Karmen Bongo, MD      . sorbitol 70 % solution 30 mL  30 mL Oral PRN Karmen Bongo, MD      . zolpidem Lorrin Mais) tablet 5 mg  5 mg Oral QHS PRN Karmen Bongo, MD       Current Outpatient Medications  Medication Sig Dispense Refill  . amLODipine (NORVASC) 5 MG tablet Take 5 mg by mouth daily.    . carvedilol (COREG) 3.125 MG tablet Take 1 tablet (3.125 mg total) by mouth 2 (two) times daily with a meal. 60 tablet 0  . epoetin alfa (EPOGEN) 3000 UNIT/ML injection Inject 3,000 Units into the skin every 14 (fourteen) days.     Marland Kitchen gabapentin (NEURONTIN) 100 MG capsule Take 1 capsule (100 mg total) by mouth 2 (two) times  daily. 60  capsule 0  . levothyroxine (SYNTHROID) 25 MCG tablet Take 1 tablet (25 mcg total) by mouth daily. 30 tablet 0  . lovastatin (MEVACOR) 40 MG tablet Take 1 tablet (40 mg total) by mouth at bedtime. 30 tablet 0  . multivitamin (RENA-VIT) TABS tablet Take 1 tablet by mouth at bedtime. 30 tablet 0  . pantoprazole (PROTONIX) 40 MG tablet Take 1 tablet (40 mg total) by mouth daily. 30 tablet 0    Musculoskeletal: Strength & Muscle Tone: Unable to assess Gait & Station: Unable to assess Patient leans: N/A  Psychiatric Specialty Exam: Physical Exam  Constitutional: She is oriented to person, place, and time. She appears well-developed and well-nourished.  Cardiovascular: Normal rate and regular rhythm.  Respiratory: Effort normal.  Musculoskeletal:        General: Normal range of motion.  Neurological: She is alert and oriented to person, place, and time.  Psychiatric: She has a normal mood and affect. Her speech is normal and behavior is normal. Judgment and thought content normal. Cognition and memory are normal.    Review of Systems  Constitutional: Negative.   HENT: Negative.   Eyes: Negative.   Respiratory: Negative.   Cardiovascular: Negative.   Gastrointestinal: Negative.   Genitourinary: Negative.   Musculoskeletal: Negative.   Skin: Negative.   Neurological: Negative.   Psychiatric/Behavioral: Positive for sleep disturbance.    Blood pressure (!) 182/83, pulse 89, temperature 98.1 F (36.7 C), resp. rate 20, height 5\' 3"  (1.6 m), weight 81.6 kg, last menstrual period 12/17/2016, SpO2 98 %.Body mass index is 31.89 kg/m.  General Appearance: Casual  Eye Contact:  Good  Speech:  Clear and Coherent and Normal Rate  Volume:  Normal  Mood:  Euthymic  Affect:  Appropriate and Congruent  Thought Process:  Coherent, Goal Directed and Descriptions of Associations: Intact  Orientation:  Full (Time, Place, and Person)  Thought Content:  WDL and Logical  Suicidal Thoughts:  No   Homicidal Thoughts:  No  Memory:  Immediate;   Good Recent;   Good Remote;   Good  Judgement:  Good  Insight:  Fair  Psychomotor Activity:  Normal  Concentration:  Concentration: Good and Attention Span: Good  Recall:  Good  Fund of Knowledge:  Good  Language:  Good  Akathisia:  No  Handed:  Right  AIMS (if indicated):     Assets:  Communication Skills Desire for Improvement Financial Resources/Insurance Housing Intimacy Leisure Time Resilience Social Support Talents/Skills Transportation Vocational/Educational  ADL's:  Impaired  Cognition:  WNL  Sleep:        Treatment Plan Summary: Case discussed with Dr. Dwyane Dee. Medication management recommend consider Zoloft 25 mg daily to address depressive symptoms of insomnia and decreased appetite. Recommend consider social work consult for concerns voiced by daughter.  Disposition: No evidence of imminent risk to self or others at present.   Patient does not meet criteria for psychiatric inpatient admission.  This service was provided via telemedicine using a 2-way, interactive audio and video technology.  Names of all persons participating in this telemedicine service and their role in this encounter. Name: Melanie Hall Role: Patient  Name: Ruthann Cancer telephone Role: Patient's daughter  Name: Letitia Libra Role: [place], [place] 09/10/2019 11:52 AM

## 2019-09-10 NOTE — ED Notes (Signed)
Pt returned from MRI °

## 2019-09-10 NOTE — ED Notes (Signed)
Report given to 5M RN. All questions answered 

## 2019-09-10 NOTE — ED Provider Notes (Signed)
Patient presents in transfer from [facility] She was transferred here to obtain MRI imaging and to be admitted.  Patient has complicated medical history including recent initiation of dialysis.  Patient was also recently in the hospital had a stroke work-up that was negative She now presents with difficulty walking and leg weakness as well as incontinence. Per previous provider, patient had diminished rectal tone and appeared to have weakness in her legs left worse than right She had any emergent teleneurology consultation and recommended stat MRI.  She was transferred to [facility] for emergency MRI Patient is awake alert, reporting persistent headache she has had for weeks.  Denies any recent falls. No arm drift, but she does report numbness in her right arm. I will add on cervical MRI as well.  Since she just started dialysis, she would not receive IV contrast  plan to obtain MRI cervical/thoracic/lumbar spine.  Patient will require admission.  She was noted to have UTI      Ripley Fraise, MD 09/10/19 [phone]

## 2019-09-10 NOTE — ED Notes (Addendum)
Pt O2 saturation 87% on room air. RR 16. Pt placed on 2L [place] and O2 saturation improved to 97%.

## 2019-09-10 NOTE — ED Notes (Signed)
Contacted MRI to inquire about when pt would be taken to STAT MRI that was ordered for 0500 this am; MRI aware of order, states they have had many STAT orders this am, unsure when pt will be taken to MRI at this time

## 2019-09-10 NOTE — Consult Note (Signed)
WOC Nurse Consult Note: Reason for Consult: Self neglect  Incontinence associated dermatitis to gluteal fold near sacrum. Maceration at coccyx.   Wound type:MASD from incontinence Patient states she cannot always care for herself and wears a brief at home.  She sleeps on a couch, sleeping more than 10 hours daily.  Pressure Injury POA: Yes pressure and moisture Measurement:coccyx:  1 cm x 0.5 cm x 0.2 cm macerated opening Sacrum:  2 cmx 2 cm x 0.1 cm erythema with partially denuded skin Wound EWY:BRKV and moist Drainage (amount, consistency, odor) minimal minimal serosanguinous  No odor.  Periwound:moist Dressing procedure/placement/frequency: Cleanse sacrococcygeal wounds with NS and pat dry.  Apply calcium alginate to coccyx wound.  Cover with silicone foam.  Barrier cream to gluteal folds after incontinence.  No disposable briefs or underpads.  Will not follow at this time.  Please re-consult if needed.  Domenic Moras MSN, RN, FNP-BC CWON Wound, Ostomy, Continence Nurse Pager [phone]

## 2019-09-10 NOTE — Progress Notes (Signed)
CSW received consult for patient to assist with concerns mentioned by the patient's daughter during TTS assessment.   CSW spoke with patient's daughter [name] at [phone] to discuss her concerns. [name] confirms that the patient is uninsured but she has applied for Medicaid. [name] reports the patient receives home health services through the charity program at [facility]. [name] reports the patient does not take her medications. [name] confirms that the patient is active with [facility] in [place] for HD treatments. [name] reports the patient is non ambulatory and is incontinent of urine and bowel. CSW explained to [facility] that PT would be consulted to complete an evaluation of the patient to determine her needs.   RN CM placed PT orders so that recommendations can be made.   Madilyn Fireman, MSW, LCSW-A Transitions of Care  Clinical Social Worker  [facility] Emergency Departments  Medical ICU [phone]

## 2019-09-10 NOTE — ED Provider Notes (Signed)
Pt endorsed to dr Lorin Mercy with hospitalist for admission    Ripley Fraise, MD 09/10/19 [phone]

## 2019-09-10 NOTE — ED Notes (Signed)
Second set of blood cultures are listed as in process

## 2019-09-10 NOTE — ED Notes (Signed)
TTS in progress 

## 2019-09-10 NOTE — ED Provider Notes (Signed)
I have confirmed with MRI/radiology that patient can receive IV contrast for MRI if she is dialyzed today.  I discussed with Dr. Joelyn Oms with nephrology who will arrange dialysis later today after the MRI studies.  This was discussed with patient including the risk of IV contrast and she is agreeable with plan Per neurology recommendations, it was recommended that she have MRI imaging with contrast. Patient will have emergent MRIs done in the ER   Ripley Fraise, MD 09/10/19 [phone]

## 2019-09-10 NOTE — ED Notes (Signed)
Lunch Tray Ordered @ 1054. 

## 2019-09-10 NOTE — H&P (Signed)
History and Physical    Melanie Hall VZC:[id number] DOB: 1970-08-18 DOA: 09/09/2019  PCP: [facility] [place] Consultants:  Marlou Sa - orthopedics; Scot Dock - vascular; Theador Hawthorne - nephrology  Patient coming from:  Home - lives with daughter; Donald Prose: Daughter, [name], [phone]  Chief Complaint: weakness  HPI: Melanie Hall is a 49 y.o. female with medical history significant of HTN; DM; and ESRD recently started on HD presenting with weakness.  She is not able to walk.  This has been going on for 4 weeks.  She is not certain why this is happened.  She started HD 4 weeks ago and the inability to walk started at the same time.  Her R arm is also weak.  She is having numbness and tingling of her arms and legs.  +urinary and fecal incontinence for the same length of time.  Nothing changed yesterday to make her come to the ER.  She has a mild headache.  No visual disturbance.  No dysphagia.  The patient appeared frustrated and reluctant to provide history.  I spoke with her daughter, who brought her to the ER.  She has been been out of the hospital for 2-3 weeks.  During the time she was hospitalized, she only got up once to walk and has been unable to walk since.  [facility] has been helping at home, and they raised concerns about her inability to walk.  She is not taking medications, no insulin since discharge.  Her daughter is taking her to HD qMWF and otherwise only wants fast food, doesn't want to eat right.  She has a couple of bedsores.  Her daughter this is physical and psychological.  She had prior h/o leg surgery and refused therapy and mostly stopped moving around and her health declined accordingly.  She walked with a walker until a couple of months ago, but never really walked that much after her leg surgery.  Yesterday, while her daughter talked to the triage nurse, her mother stood with the triage nurse but she won't do that at home.  She voids/stools at home and will not attempt to get out of  bed to use the bedside commode.  Her daughter thinks she needs SNF rehab, but the patient has refused that.   They are thinking APS may need to be involved.  Her sister moved in to her house 2 days ago and her mother didn't like it that she was making her move too much.  There is someone bringing her fast food and her mother refuses to eat healthy foods. At this point, "we tried it, we tried to let her be at home..." but it isn't working.  Strong FH of mental health disorders; her 2nd husband was killed; daughter agrees with psychiatry evaluation.   ED Course: Has UTI and needs MRI of spine.  Showed up at [facility], concern for cauda equina, unable to get MRI effectively.  Likely needs anesthesia for MRI.  Acute vs. Chronic weakness, urinary incontinence.  Teleneuro recommended MRI while at [facility].  Review of Systems: As per HPI; otherwise review of systems reviewed and negative.   Ambulatory Status:  Ambulated with a walker prior to onset of HD  Past Medical History:  Diagnosis Date  . Anemia   . Cataracts, bilateral    surgery to remove  . Diabetes mellitus    type 2  . ESRD on hemodialysis ([place])   . GERD (gastroesophageal reflux disease)   . Hypertension   . SVD (spontaneous vaginal  delivery)    x 4    Past Surgical History:  Procedure Laterality Date  . AV FISTULA PLACEMENT Left 08/17/2019   Procedure: LEFT BRACHIAL CEPHALIC ARTERIOVENOUS (AV) FISTULA;  Surgeon: Angelia Mould, MD;  Location: [place];  Service: Vascular;  Laterality: Left;  . EYE SURGERY Bilateral    cataracts removed  . FEMUR IM NAIL Left 10/28/2018   Procedure: RETROGRADE FEMORAL NAILING;  Surgeon: Meredith Pel, MD;  Location: [place];  Service: Orthopedics;  Laterality: Left;  . IM NAILING FEMORAL SHAFT RETROGRADE Left 10/28/2018  . IR FLUORO GUIDE CV LINE RIGHT  08/11/2019  . IR US GUIDE VASC ACCESS RIGHT  08/11/2019  . KNEE SURGERY    . TUBAL LIGATION      Social History   Socioeconomic History  . Marital  status: Widowed    Spouse name: Not on file  . Number of children: 4  . Years of education: Not on file  . Highest education level: Not on file  Occupational History  . Not on file  Tobacco Use  . Smoking status: Former Smoker    Types: Cigarettes    Quit date: 1997    Years since quitting: 24.1  . Smokeless tobacco: Never Used  Substance and Sexual Activity  . Alcohol use: No  . Drug use: No  . Sexual activity: Yes    Birth control/protection: Surgical  Other Topics Concern  . Not on file  Social History Narrative  . Not on file   Social Determinants of Health   Financial Resource Strain:   . Difficulty of Paying Living Expenses: Not on file  Food Insecurity:   . Worried About Charity fundraiser in the Last Year: Not on file  . Ran Out of Food in the Last Year: Not on file  Transportation Needs:   . Lack of Transportation (Medical): Not on file  . Lack of Transportation (Non-Medical): Not on file  Physical Activity:   . Days of Exercise per Week: Not on file  . Minutes of Exercise per Session: Not on file  Stress:   . Feeling of Stress : Not on file  Social Connections:   . Frequency of Communication with Friends and Family: Not on file  . Frequency of Social Gatherings with Friends and Family: Not on file  . Attends Religious Services: Not on file  . Active Member of Clubs or Organizations: Not on file  . Attends Archivist Meetings: Not on file  . Marital Status: Not on file  Intimate Partner Violence:   . Fear of Current or Ex-Partner: Not on file  . Emotionally Abused: Not on file  . Physically Abused: Not on file  . Sexually Abused: Not on file    No Known Allergies  Family History  Problem Relation Age of Onset  . Diabetes Mother   . Hypertension Mother   . Bipolar disorder Mother   . Brain cancer Maternal Aunt   . Heart disease Maternal Grandmother   . Diabetes Maternal Grandmother   . Breast cancer Maternal Aunt   . Depression Father         Committed suicide  . Diabetes Sister     Prior to Admission medications   Medication Sig Start Date End Date Taking? Authorizing Provider  amLODipine (NORVASC) 5 MG tablet Take 5 mg by mouth daily.   Yes [provider]  carvedilol (COREG) 3.125 MG tablet Take 1 tablet (3.125 mg total) by mouth 2 (two)  times daily with a meal. 08/25/19 08/24/20 Yes Hoyt Koch, MD  epoetin alfa (EPOGEN) 3000 UNIT/ML injection Inject 3,000 Units into the skin every 14 (fourteen) days.  06/12/19  Yes [provider]  gabapentin (NEURONTIN) 100 MG capsule Take 1 capsule (100 mg total) by mouth 2 (two) times daily. 08/25/19  Yes Hoyt Koch, MD  levothyroxine (SYNTHROID) 25 MCG tablet Take 1 tablet (25 mcg total) by mouth daily. 08/25/19  Yes Hoyt Koch, MD  lovastatin (MEVACOR) 40 MG tablet Take 1 tablet (40 mg total) by mouth at bedtime. 08/25/19  Yes Hoyt Koch, MD  midodrine (PROAMATINE) 5 MG tablet Take 1 tablet (5 mg total) by mouth 3 (three) times daily with meals. Patient taking differently: Take 5 mg by mouth See admin instructions. Take 5mg  three times daily on dialysis days. Take 5mg  at breakfast and bedtime, then take two tablets before treatment on dialysis days 08/25/19  Yes Hoyt Koch, MD  multivitamin (RENA-VIT) TABS tablet Take 1 tablet by mouth at bedtime. 08/25/19  Yes Hoyt Koch, MD  pantoprazole (PROTONIX) 40 MG tablet Take 1 tablet (40 mg total) by mouth daily. 08/25/19 08/24/20 Yes Hoyt Koch, MD  acetaminophen (TYLENOL) 325 MG tablet Take 2 tablets (650 mg total) by mouth every 6 (six) hours as needed for mild pain or headache (or Fever >/= 101). Patient not taking: Reported on 07/23/2019 08/28/18   Barton Dubois, MD  insulin aspart protamine- aspart (NOVOLOG MIX 70/30) (70-30) 100 UNIT/ML injection Inject 0.1 mLs (10 Units total) into the skin 2 (two) times daily with a meal. Patient not taking: Reported  on 09/09/2019 08/25/19   Hoyt Koch, MD  midodrine (PROAMATINE) 10 MG tablet Take 1 tablet (10 mg total) by mouth Every Tuesday,Thursday,and Saturday with dialysis. Patient not taking: Reported on 09/09/2019 08/27/19   Hoyt Koch, MD    Physical Exam: Vitals:   09/10/19 0845 09/10/19 1000 09/10/19 1015 09/10/19 1218  BP: (!) 185/91 (!) 170/87 (!) 182/83 (!) 178/86  Pulse: 94 90 89 96  Resp:    18  Temp:      TempSrc:      SpO2:  95% 98% 98%  Weight:      Height:         . General:  Appears calm and comfortable and is NAD; mildly disheveled, somewhat hostile and unwilling to answer questions but participated in exam . Eyes:  PERRL, EOMI, normal lids, iris . ENT:  grossly normal hearing, lips & tongue, mmm; suboptimal dentition . Neck:  no LAD, masses or thyromegaly; R-sided TDC in place . Cardiovascular:  RRR, no m/r/g. No LE edema.  Marland Kitchen Respiratory:   CTA bilaterally with no wheezes/rales/rhonchi.  Normal respiratory effort. . Abdomen:  soft, NT, ND, NABS . Skin:  no rash or induration seen on limited exam; daughter reports sacral pressure ulcers . Musculoskeletal:  L > RLE weakness with scant ?RUE weakness, somewhat appears to be effort-dependent . Psychiatric:  Flat and somewhat hostile mood and affect, speech fluent and appropriate, AOx3, reluctant to speak to me or answer questions . Neurologic:  CN 2-12 grossly intact    Radiological Exams on Admission: CT Head Wo Contrast  Result Date: 09/10/2019 CLINICAL DATA:  Weakness, locking EXAM: CT HEAD WITHOUT CONTRAST TECHNIQUE: Contiguous axial images were obtained from the base of the skull through the vertex without intravenous contrast. COMPARISON:  August 08, 2019 FINDINGS: Brain: No evidence of acute territorial infarction, hemorrhage, hydrocephalus,extra-axial  collection or mass lesion/mass effect. Normal gray-white differentiation. Ventricles are normal in size and contour. Vascular: No hyperdense vessel or  unexpected calcification. Skull: The skull is intact. No fracture or focal lesion identified. Sinuses/Orbits: The visualized paranasal sinuses and mastoid air cells are clear. The orbits and globes intact. Other: None IMPRESSION: No acute intracranial abnormality. Electronically Signed   By: Prudencio Pair M.D.   On: 09/10/2019 00:18   MR CERVICAL SPINE WO CONTRAST  Result Date: 09/10/2019 CLINICAL DATA:  Neuro deficit, acute, stroke suspected. Bilateral lower extremity weakness. Bladder stool contents. Symptoms 4 weeks. End-stage renal disease. Hospitalization approximately 1 month ago. EXAM: MRI CERVICAL SPINE WITHOUT CONTRAST TECHNIQUE: Multiplanar, multisequence MR imaging of the cervical spine was performed. No intravenous contrast was administered. COMPARISON:  CT of the cervical spine 08/13/2019 FINDINGS: Alignment: Significant listhesis is present. There is some straightening of the normal cervical lordosis. Vertebrae: Marrow signal and vertebral body heights are normal. Cord: The study is mildly degraded by patient motion. No abnormal cord signal or morphology is present. Signal changes on the T2 sagittal sequence are not present on the axial or T2 STIR sequence. Posterior Fossa, vertebral arteries, paraspinal tissues: Craniocervical junction is normal. Flow is present in the vertebral arteries bilaterally. Visualized intracranial contents are normal. Disc levels: No significant cervical disc protrusion or stenosis is present. The central canal and foramina are patent. IMPRESSION: 1. No acute or focal lesion to explain the patient's symptoms. 2. Straightening of the normal cervical lordosis is nonspecific, but can be seen in the setting of muscle strain or ongoing pain. Electronically Signed   By: San Morelle M.D.   On: 09/10/2019 06:50   DG Chest Portable 1 View  Result Date: 09/10/2019 CLINICAL DATA:  Weakness and shortness of breath EXAM: PORTABLE CHEST 1 VIEW COMPARISON:  08/08/2019  FINDINGS: Cardiac shadow is stable. Dialysis catheter is now seen in satisfactory position. Pleural effusions are noted right greater than left with central vascular congestion. This may be related to a degree of volume overload. Bibasilar atelectasis is seen. Patchy opacities are noted which may be related to atypical pneumonia. Clinical correlation is recommended. IMPRESSION: Vascular congestion and effusions likely related to volume overload. This is new from the prior exam. Patchy opacities are noted in both lungs suspicious for atypical pneumonia. Correlation with COVID-19 testing is recommended. Electronically Signed   By: Inez Catalina M.D.   On: 09/10/2019 00:28    EKG: Independently reviewed.  NSR with rate 90; nonspecific ST changes with no evidence of acute ischemia   Labs on Admission: I have personally reviewed the available labs and imaging studies at the time of the admission.  Pertinent labs:   K+ 3.4 Glucose 281 BUN 14/Creatinine 2.45/GFR 22 AP 154 Albumin 2.9 Lactate 0.8 WBC 9.4 Hgb 10.0 UA: 250 glucose, large Hgb, large LE, negative nitrite, >300 protein; many bacteria, >50 WBC Blood and urine cultures pending Respiratory panel PCR negative   Assessment/Plan Principal Problem:   Debility Active Problems:   Type 2 diabetes with nephropathy (HCC)   HYPERCHOLESTEROLEMIA   Hypertension associated with diabetes ([place])   ESRD (end stage renal disease) on dialysis ([place])   Depression   Incontinence in female   Pressure ulcer   Debility with fecal/urinary incontinence -Patient with remote h/o leg surgery (L femur fracture in April 2020) and has not been very mobile since -She was recently hospitalized (1/30-2/16) and started on HD and since then has rarely gotten OOB -Absent reflexes, likely consistent with diabetic  polyneuropathy -With incontinence, this raises concern for cauda equina - although daughter reports that incontinence is more associated with patient being  unable/unwilling to get out of bed for toileting -Patient was unable to tolerate MRI even with anxiolytic so if further MRI is necessary she will have to have general anesthesia  -Dr. Lorraine Lax will discuss further with the daughter to determine if this is necessary -In the meantime, will admit for PT/OT evals and possible placement (TOC consulted)  ESRD on MWF HD -Patient was recently started on HD and her debility appears to have been exacerbated by this -[facility] renal biopsy results not available at this time -Nephrology is following the patient -She is due to HD again tomorrow and this has been scheduled -Nephrology prn order set was used  Depression -There certainly appears to be a psychogenic component in place, although it is hard to tell how much is physiologic and what proportion is psychologic -Psych consult requested; she does not meet inpatient criteria but is recommended to start Zoloft 25 mg daily and this has been ordered  DM -Will check A1c; last was 8.1 in 11/20 and indicated poor control and she has not been taking medication for this issue recently -Cover with very sensitive-scale SSI  HTN -Continue Norvasc, Coreg  Hypothyroidism -Check TSH -Continue Synthroid at current dose for now  HLD -Continue Mevacor (formulary substitution to Pravastatin)  Pressure ulcer -Wound care consult requested     Note: This patient has been tested and is negative for the novel coronavirus COVID-19.  DVT prophylaxis:  Heparin Code Status:  Full - confirmed with patient Family Communication: None present; I spoke with the patient's daughter by telephone at the time of admission. Disposition Plan: To be determined - she would benefit from SNF placement but is reluctant to agree to this at this time. Consults called: Neurology; Nephrologist, Psychiatry; PT/OT/TOC team/Wound care/APS Admission status: Admit - It is my clinical opinion that admission to INPATIENT is reasonable and  necessary because of the expectation that this patient will require hospital care that crosses at least 2 midnights to treat this condition based on the medical complexity of the problems presented.  Given the aforementioned information, the predictability of an adverse outcome is felt to be significant.    Karmen Bongo MD [facility]   How to contact the [facility] Attending or Consulting provider [place] or covering provider during after hours [place], for this patient?  1. Check the care team in [facility] and look for a) attending/consulting TRH provider listed and b) the [facility] team listed 2. Log into [email or web address] and use [place]'s universal password to access. If you do not have the password, please contact the hospital operator. 3. Locate the [facility] provider you are looking for under [facility] and page to a number that you can be directly reached. 4. If you still have difficulty reaching the provider, please page the [facility] (Director on Call) for the Hospitalists listed on amion for assistance.   09/10/2019, 2:43 PM

## 2019-09-10 NOTE — ED Notes (Signed)
Pt transported to MRI 

## 2019-09-10 NOTE — ED Notes (Signed)
Lunch tray at bedside. ?

## 2019-09-10 NOTE — ED Provider Notes (Signed)
Patient unable to remain still for mri.  They started with cervical MRI and she did not receive IV contrast.   At this point she will require admission and may need sedation to obtain MRI.  She reports symptoms worsening over weeks  Will consult hospitalist     Ripley Fraise, MD 09/10/19 [phone]

## 2019-09-10 NOTE — ED Notes (Signed)
Pt nauseous in MRI, MD aware. New order placed.

## 2019-09-10 NOTE — Consult Note (Addendum)
NEUROHOSPITALIST ADDENDUM Performed a face to face diagnostic evaluation.    ASSESSMENT AND PLAN  Reviewed the note written by Etta Quill, PA-C  49 year old female with longstanding history of diabetes mellitus, end-stage renal disease on hemodialysis, hypertension presented to [facility] emergency department with 4 weeks history of progressive bilateral lower extremity weakness, numbness, bowel and bladder incontinence.  Patient denies any sensory level, does complain of numbness and tingling of both legs from the level of thighs and downwards.Seen by tele-neurology who was concerned about cauda equina syndrome and recommended MRI CT and L-spine to rule out cord compression/ cauda equina syndrome.  Spoke with daughter over the phone, patient always had longstanding history of diarrhea.  However since the last 3 or 4 weeks every time she checks on her mother she is found that her clothes are dirtied.  Patient tells me that she does not know when she has to use the bathroom.  This is something new that happened since starting dialysis.  On examination, she has mild weakness in bilateral lower extremities.  She has at least 4+/5 strength in bilateral hip flexion, 5 x 5 hip extension, 4+/5 to hip adduction and abduction, 4+/5 to knee extension and flexion and 4/5 plantar extension flexion bilaterally.  Upper extremities are 5 out of 5 strength.  Sensory exam was abnormal for light touch/temperature up to both thighs, perianal area.  No sensory level at the abdomen.  Reduced sensation in both hands. Reduced vibration and position sense in both toes.  Absent patellar and ankle reflexes.  1+ brachial radialis, 1+ biceps.    Impression:  Suspect patient likely has polyradiculoneuropathy-likely from longstanding uncontrolled diabetes resulting in weakness/numbness as well uremia (now improved) -possibly more symptomatic now as patient tired after starting dialysis  Other differentials to be  considered: Cauda equina syndrome, neuropathy from vitamin deficiency.  History not consistent with AIDP.  Plan Increase Neurontin to 300 mg 2 times daily Improved control of diabetes Check B12, TSH, ESR, CRP, folic acid MRI T and L-spine, attempt with 2 mg of Ativan first-if not may need general anesthesia PT OT evaluation Outpatient EMG/nerve conduction study  Neurology will follow up results.  If MRI negative, patient will need PT OT and outpatient neurology follow-up with neurologist/as well as outpatient EMG nerve conduction study.   [name] MD [facility] [phone]   If 7pm to 7am, please call on call as listed on AMION.         Neurology Consultation  Reason for Consult: LE weakness and incontaenance Referring Physician: yates  CC: *le weakness and fecal/urinary incontinance  History is obtained from:patient and chart  HPI: Melanie Hall is a 49 y.o. female hypertension, hemodialysis, diabetes.  Patient came to the ED secondary to the fact that she had lower extremity weakness in addition to bowel and bladder incontinence.  Neurology was asked to see the patient to further evaluate for cauda equina versus other etiologies for lower extremity weakness.  Looking back in her chart.  On 08/11/2019 patient was in the hospital for an HD catheter placement.  Patient started hemodialysis on 08/17/2019.  PT had worked with her while she was in the hospital.  On February 10 it was noted that patient had decreased ambulation.  The next day on February 11 it was noted that the patient had paresis of bilateral lower extremities and was noted to be both fecal and urinary incontinent.  Patient was discharged from the hospital on February 16.  From that point  on she was getting home physical therapy.  While she was working with home physical therapy for 6 weeks the physical therapist noted that she is not making progression.  She called the daughter to inform her of  this.  In talking to the patient, she feels as though since she was discharged her symptoms have not improved possibly gotten worse.  Patient states that she is wheelchair-bound at this point.  She has decreased sensation in bilateral legs.  As far as her incontinence, she states that she does not know when she is defecating and/or urinating.  Patient also states that she is unable to feel in the perineal region.  I did call her daughter who mentioned that the patient stated "I do know when I have to go however at times I cannot hold it in but I feel it when I go "in addition after dialysis, she had told her daughter that she knew that she could go urinate.  Daughter is concerned, but also notices some inconsistency with what her mother states from time to time.  ED course  Relevant labs include -Large leukocytes and many bacteria, Cr. 2.45. BUN 14, A1c pending CT head shows -no acute abnormality  Chart review (none0  Past Medical History:  Diagnosis Date  . Anemia   . Cataracts, bilateral    surgery to remove  . Diabetes mellitus    type 2  . ESRD on hemodialysis ([place])   . GERD (gastroesophageal reflux disease)   . Hypertension   . SVD (spontaneous vaginal delivery)    x 4    Family History  Problem Relation Age of Onset  . Diabetes Mother   . Hypertension Mother   . Bipolar disorder Mother   . Brain cancer Maternal Aunt   . Heart disease Maternal Grandmother   . Diabetes Maternal Grandmother   . Breast cancer Maternal Aunt   . Depression Father        Committed suicide  . Diabetes Sister    Social History:   reports that she quit smoking about 24 years ago. Her smoking use included cigarettes. She has never used smokeless tobacco. She reports that she does not drink alcohol or use drugs.  Medications  Current Facility-Administered Medications:  .  acetaminophen (TYLENOL) tablet 650 mg, 650 mg, Oral, Q6H PRN **OR** acetaminophen (TYLENOL) suppository 650 mg, 650 mg,  Rectal, Q6H PRN, Karmen Bongo, MD .  amLODipine (NORVASC) tablet 5 mg, 5 mg, Oral, Daily, Karmen Bongo, MD .  calcium carbonate (dosed in mg elemental calcium) suspension 500 mg of elemental calcium, 500 mg of elemental calcium, Oral, Q6H PRN, Karmen Bongo, MD .  camphor-menthol [facility]) lotion 1 application, 1 application, Topical, C5E PRN **AND** hydrOXYzine (ATARAX/VISTARIL) tablet 25 mg, 25 mg, Oral, Q8H PRN, Karmen Bongo, MD .  carvedilol (COREG) tablet 3.125 mg, 3.125 mg, Oral, BID WC, Karmen Bongo, MD .  Derrill Memo ON 09/11/2019] cefTRIAXone (ROCEPHIN) 1 g in sodium chloride 0.9 % 100 mL IVPB, 1 g, Intravenous, Once, Karmen Bongo, MD .  Chlorhexidine Gluconate Cloth 2 % PADS 6 each, 6 each, Topical, Q0600, Karmen Bongo, MD .  docusate sodium [facility]) enema 283 mg, 1 enema, Rectal, PRN, Karmen Bongo, MD .  feeding supplement (NEPRO CARB STEADY) liquid 237 mL, 237 mL, Oral, TID PRN, Karmen Bongo, MD .  gabapentin (NEURONTIN) capsule 100 mg, 100 mg, Oral, BID, Karmen Bongo, MD .  heparin injection 5,000 Units, 5,000 Units, Subcutaneous, Q8H, Karmen Bongo, MD .  insulin aspart (novoLOG)  injection 0-5 Units, 0-5 Units, Subcutaneous, QHS, Karmen Bongo, MD .  insulin aspart (novoLOG) injection 0-6 Units, 0-6 Units, Subcutaneous, TID WC, Karmen Bongo, MD .  levothyroxine (SYNTHROID) tablet 25 mcg, 25 mcg, Oral, QAC breakfast, Karmen Bongo, MD .  multivitamin (RENA-VIT) tablet 1 tablet, 1 tablet, Oral, QHS, Karmen Bongo, MD .  ondansetron [facility]) tablet 4 mg, 4 mg, Oral, Q6H PRN **OR** ondansetron (ZOFRAN) injection 4 mg, 4 mg, Intravenous, Q6H PRN, Karmen Bongo, MD .  pantoprazole (PROTONIX) EC tablet 40 mg, 40 mg, Oral, Daily, Karmen Bongo, MD .  pravastatin (PRAVACHOL) tablet 40 mg, 40 mg, Oral, q1800, Karmen Bongo, MD .  sorbitol 70 % solution 30 mL, 30 mL, Oral, PRN, Karmen Bongo, MD .  zolpidem (AMBIEN) tablet 5 mg, 5 mg, Oral, QHS PRN,  Karmen Bongo, MD  Current Outpatient Medications:  .  amLODipine (NORVASC) 5 MG tablet, Take 5 mg by mouth daily., Disp: , Rfl:  .  carvedilol (COREG) 3.125 MG tablet, Take 1 tablet (3.125 mg total) by mouth 2 (two) times daily with a meal., Disp: 60 tablet, Rfl: 0 .  epoetin alfa (EPOGEN) 3000 UNIT/ML injection, Inject 3,000 Units into the skin every 14 (fourteen) days. , Disp: , Rfl:  .  gabapentin (NEURONTIN) 100 MG capsule, Take 1 capsule (100 mg total) by mouth 2 (two) times daily., Disp: 60 capsule, Rfl: 0 .  levothyroxine (SYNTHROID) 25 MCG tablet, Take 1 tablet (25 mcg total) by mouth daily., Disp: 30 tablet, Rfl: 0 .  lovastatin (MEVACOR) 40 MG tablet, Take 1 tablet (40 mg total) by mouth at bedtime., Disp: 30 tablet, Rfl: 0 .  multivitamin (RENA-VIT) TABS tablet, Take 1 tablet by mouth at bedtime., Disp: 30 tablet, Rfl: 0 .  pantoprazole (PROTONIX) 40 MG tablet, Take 1 tablet (40 mg total) by mouth daily., Disp: 30 tablet, Rfl: 0  ROS: .   General ROS: negative for - chills, fatigue, fever, night sweats, weight gain or weight loss Psychological ROS: negative for - behavioral disorder, hallucinations, memory difficulties, mood swings or suicidal ideation Ophthalmic ROS: negative for - blurry vision, double vision, eye pain or loss of vision Endocrine ROS: negative for - galactorrhea, hair pattern changes, polydipsia/polyuria or temperature intolerance Respiratory ROS: negative for - cough, hemoptysis, shortness of breath or wheezing Cardiovascular ROS: negative for - chest pain, dyspnea on exertion, edema or irregular heartbeat Gastrointestinal ROS: positivefor -  stool incontinence Genito-Urinary ROS: positivefor - incontinence or urinary frequency/urgency Musculoskeletal ROS: positivefor -muscular weakness Neurological ROS: as noted in HPI Dermatological ROS: negative for rash and skin lesion changes  Exam: Current vital signs: BP (!) 182/83   Pulse 89   Temp 98.1 F (36.7  C)   Resp 20   Ht '5\' 3"'  (1.6 m)   Wt 81.6 kg   LMP 12/17/2016   SpO2 98%   BMI 31.89 kg/m  Vital signs in last 24 hours: Temp:  [98.1 F (36.7 C)-98.9 F (37.2 C)] 98.1 F (36.7 C) (03/04 0321) Pulse Rate:  [74-101] 89 (03/04 1015) Resp:  [16-20] 20 (03/04 0745) BP: (164-188)/(81-96) 182/83 (03/04 1015) SpO2:  [90 %-99 %] 98 % (03/04 1015) Weight:  [77.2 kg-81.6 kg] 81.6 kg (03/03 2045)   Constitutional: Appears well-developed and well-nourished.  Psych: Affect appropriate to situation Eyes: No scleral injection HENT: No OP obstrucion Head: Normocephalic.  Cardiovascular: Normal rate and regular rhythm.  Respiratory: Effort normal, non-labored breathing GI: Soft.  No distension. There is no tenderness.  Skin: WDI Neuro: Mental  Status: Patient is awake, alert, oriented to person, place, month, year, and situation. Speech-intact for naming, repeating, comprehension Cranial Nerves: II: Visual Fields are full.  III,IV, VI: EOMI without ptosis or diploplia. Pupils equal, round and reactive to light V: Facial sensation is symmetric to temperature VII: Facial movement is symmetric.  VIII: hearing is intact to voice X: Palat elevates symmetrically XI: Shoulder shrug is symmetric. XII: tongue is midline without atrophy or fasciculations.  Motor: Tone is normal. Bulk is normal. 5/5 strength was present in all four extremities.  At times strength is inconsistent.  Anal tone was tested and did not show any strength. Sensory: Bilateral lower extremities has decreased sensation from feet up to mid thigh.  Bilateral lower extremities has decreased pinprick from feet up to just above the kneecap.  Patient states that she has no vibratory sensation at the ankle and/or knee.  Also has no proprioception at the toe and/or ankle. Deep Tendon Reflexes: No deep tendon reflexes at the ankle and/or knee.  1+ bilateral upper extremities Plantars: Mute bilaterally Cerebellar: FNF  intact  bilaterally  Labs I have reviewed labs in epic and the results pertinent to this consultation are:   CBC    Component Value Date/Time   WBC 9.4 09/10/2019 0021   RBC 3.21 (L) 09/10/2019 0021   HGB 10.0 (L) 09/10/2019 0021   HCT 32.3 (L) 09/10/2019 0021   HCT 18.4 (L) 08/09/2019 1317   PLT 171 09/10/2019 0021   MCV 100.6 (H) 09/10/2019 0021   MCH 31.2 09/10/2019 0021   MCHC 31.0 09/10/2019 0021   RDW 16.6 (H) 09/10/2019 0021   LYMPHSABS 1.5 09/10/2019 0021   MONOABS 0.8 09/10/2019 0021   EOSABS 0.3 09/10/2019 0021   BASOSABS 0.1 09/10/2019 0021    CMP     Component Value Date/Time   NA 135 09/10/2019 0021   K 3.4 (L) 09/10/2019 0021   CL 99 09/10/2019 0021   CO2 26 09/10/2019 0021   GLUCOSE 281 (H) 09/10/2019 0021   BUN 14 09/10/2019 0021   CREATININE 2.45 (H) 09/10/2019 0021   CALCIUM 8.1 (L) 09/10/2019 0021   PROT 6.6 09/10/2019 0021   ALBUMIN 2.9 (L) 09/10/2019 0021   AST 15 09/10/2019 0021   ALT 17 09/10/2019 0021   ALKPHOS 154 (H) 09/10/2019 0021   BILITOT 1.2 09/10/2019 0021   GFRNONAA 22 (L) 09/10/2019 0021   GFRAA 26 (L) 09/10/2019 0021    Lipid Panel     Component Value Date/Time   CHOL 164 03/01/2019 0707   TRIG 210 (H) 03/01/2019 0707   HDL 32 (L) 03/01/2019 0707   CHOLHDL 5.1 03/01/2019 0707   VLDL 42 (H) 03/01/2019 0707   LDLCALC 90 03/01/2019 0707     Imaging I have reviewed the images obtained:  CT-scan of the brain-no acute cranial abnormalities  MRI examination of the cervical spine-no acute or focal lesion to explain patient's symptoms.  Etta Quill PA-C [facility] [phone]  M-F  (9:00 am- 5:00 PM)  09/10/2019, 11:57 AM

## 2019-09-10 NOTE — Consult Note (Signed)
[facility] TeleNeurology Consult Services  Stat Consult  Date of Service:   09/09/2019 23:58:33  Comments/Sign-Out: 49 year old woman with past medical history of ESRD on HD, DM since 2006 who has been experiencing b/l leg weakness and bladder and stool incontinence for 4 weeks. Not changing or worsening nor ascending. Exam significant for decreased strength IP L>R and 4-/5 DF and PF. Diff includes thoracic cord compression as well as cauda equina/conus medullaris. Less likely Guillain Barre since acute onset 4 weeks ago but also in the differential. May need emg/ncs if mris neg.  CT HEAD: Showed No Acute Hemorrhage or Acute Core Infarct  Metrics: [facility] Notification Time: 09/09/2019 23:58:33 Stamp Time: 09/09/2019 23:58:33 Callback Response Time: 09/09/2019 23:59:09  Our recommendations are outlined below.  Recommendations:     .  check CK, zinc, copper, thiamine     .  MRI thoracic and lumbar spine w/w/o contrast    Therapies:     .  Physical Therapy, Occupational Therapy, Speech Therapy Assessment When Applicable  Other WorkUp:     .  Check B12 level     .  Check TSH  Disposition: Neurology Follow Up Recommended  Sign Out:     .  Discussed with Emergency Department Provider  ----------------------------------------------------------------------------------------------------  Chief Complaint: b/l le weakness  History of Present Illness: Patient is a 49 year old Female.  49 year old woman with past medical history of diabetic polyneuropathy, ESRD on HD, DM since 2006 who has been experiencing b/l leg weakness and bladder and stool incontinence for 4 weeks. Endorses saddle anesthesia. Blurry vision due to cataracts. She had left leg fracture in April 2020. She endorses walking with walker and no improvement or explanation as to why she is having LE weakness. She says she was admitted 4 weeks ago and was told she may have had a mini stroke  and stayed in hospital awaiting catheter placement / HD being set up. Denies any recent accidents. Endorses numbness of both legs circunferential.    Past Medical History:     . Diabetes Mellitus         Examination: BP(pending), Pulse(pending), Blood Glucose(281)  Neuro Exam:  General: Alert,Awake, Oriented to Time, Place, Person  Speech: Fluent:  Language: Intact:  Face: Symmetric:  Facial Sensation: Intact:  Visual Fields: Intact:  Extraocular Movements: Intact:  Motor Exam: No Drift:  Sensation: Reduced: RUE, RLE  Coordination: Intact:  LE IP left 3/5 RE IP 4-/5 Q 4/5 TA 4- PF 4- no apparent patellar reflexes decreased sensation b/l LE circunferentially    Due to the immediate potential for life-threatening deterioration due to underlying acute neurologic illness, I spent 27 minutes providing critical care. This time includes time for face to face visit via telemedicine, review of medical records, imaging studies and discussion of findings with providers, the patient and/or family.   Dr Barron Schmid   [facility] [phone]  Case [id number]

## 2019-09-10 NOTE — Progress Notes (Signed)
Reason for Consult:  ESRD Referring Physician:  Dr. Karmen Bongo  Chief Complaint: Leg weakness and incontinence  Assessment/Plan: 1. ESRD - recent start on dialysis at [facility] MWF last dialysis was a full treatment on Wednesday. No acute indication for HD today but if she goes for an MRI w/ contrast please let us know and we will dialyze afterwards to decrease risk of NSF. Otherwise planning on HD Fri. 2. Renal osteodystrophy - will check for binder management. 3. Anemia - transfuse as needed; will also check iron studies and give ESA.  4. Leg weakness and incontinence r/o cord compression and caudal equina syndrome - w/u per primary, couldn't tolerate MRI earlier and may need general anesthesia for the study. We will dialyze afterwards if contrast for MRI is used; already d/w dialysis staff.  HPI: Melanie Hall is an 49 y.o. female DM with polyneuropathy, ESRD MWF [facility] initiated HD late Feb 2021 here with leg weakness, bladder and bowel incontinence for 4 weeks. She also has saddle anesthesia and uses a walker but did sustain a left leg fracture 10/2018. She also has numbness in both legs. She denies f/c/n/v; also denies CP/ dyspnea/ cough/ myalgias. CT head negative for acute abnormalities; MRI cervical spine without contrast showed no acute or focal lesion to explain the patient's symptoms with nonspecific finding of straightening of the normal cervical lordosis.  ROS Pertinent items are noted in HPI.  Chemistry and CBC: Creatinine, Ser  Date/Time Value Ref Range Status  09/10/2019 12:21 AM 2.45 (H) 0.44 - 1.00 mg/dL Final  08/25/2019 08:16 AM 5.74 (H) 0.44 - 1.00 mg/dL Final  08/25/2019 03:58 AM 5.80 (H) 0.44 - 1.00 mg/dL Final  08/24/2019 12:27 PM 5.20 (H) 0.44 - 1.00 mg/dL Final  08/23/2019 02:22 AM 3.60 (H) 0.44 - 1.00 mg/dL Final  08/22/2019 06:39 AM 5.23 (H) 0.44 - 1.00 mg/dL Final  08/21/2019 05:55 AM 3.98 (H) 0.44 - 1.00 mg/dL Final  08/20/2019 01:31 AM  5.48 (H) 0.44 - 1.00 mg/dL Final  08/19/2019 05:14 AM 4.29 (H) 0.44 - 1.00 mg/dL Final    Comment:    DELTA CHECK NOTED  08/18/2019 05:43 AM 6.55 (H) 0.44 - 1.00 mg/dL Final  08/17/2019 10:28 AM 5.82 (H) 0.44 - 1.00 mg/dL Final  08/17/2019 07:20 AM 5.90 (H) 0.44 - 1.00 mg/dL Final  08/16/2019 02:24 AM 4.17 (H) 0.44 - 1.00 mg/dL Final  08/15/2019 07:36 AM 5.97 (H) 0.44 - 1.00 mg/dL Final  08/14/2019 07:55 AM 4.89 (H) 0.44 - 1.00 mg/dL Final  08/13/2019 07:21 AM 6.90 (H) 0.44 - 1.00 mg/dL Final  08/12/2019 07:05 AM 8.08 (H) 0.44 - 1.00 mg/dL Final  08/11/2019 05:22 AM 7.54 (H) 0.44 - 1.00 mg/dL Final  08/10/2019 06:35 AM 7.00 (H) 0.44 - 1.00 mg/dL Final  08/09/2019 08:29 AM 6.76 (H) 0.44 - 1.00 mg/dL Final  08/08/2019 04:07 PM 6.40 (H) 0.44 - 1.00 mg/dL Final  07/23/2019 11:30 AM 5.19 (H) 0.44 - 1.00 mg/dL Final  05/27/2019 01:14 PM 3.53 (H) 0.44 - 1.00 mg/dL Final  03/01/2019 07:10 AM 2.21 (H) 0.44 - 1.00 mg/dL Final  02/28/2019 08:35 AM 2.10 (H) 0.44 - 1.00 mg/dL Final  02/27/2019 10:21 PM 2.13 (H) 0.44 - 1.00 mg/dL Final  11/04/2018 08:36 AM 1.26 (H) 0.44 - 1.00 mg/dL Final  11/03/2018 02:46 AM 1.56 (H) 0.44 - 1.00 mg/dL Final  11/02/2018 03:08 AM 1.26 (H) 0.44 - 1.00 mg/dL Final  11/01/2018 12:54 PM 1.29 (H) 0.44 - 1.00 mg/dL Final  10/31/2018  01:49 AM 1.39 (H) 0.44 - 1.00 mg/dL Final  10/30/2018 04:19 AM 1.35 (H) 0.44 - 1.00 mg/dL Final  10/29/2018 08:30 AM 1.29 (H) 0.44 - 1.00 mg/dL Final  10/28/2018 01:01 PM 1.15 (H) 0.44 - 1.00 mg/dL Final  08/28/2018 05:59 AM 1.61 (H) 0.44 - 1.00 mg/dL Final  08/27/2018 05:47 AM 1.06 (H) 0.44 - 1.00 mg/dL Final  08/26/2018 06:25 PM 0.98 0.44 - 1.00 mg/dL Final  10/27/2013 08:17 PM 0.52 0.50 - 1.10 mg/dL Final  07/30/2008 10:16 PM 0.61 0.40 - 1.20 mg/dL Final  07/02/2008 04:50 AM 0.74 0.4 - 1.2 mg/dL Final  07/01/2008 05:04 AM 0.84 0.4 - 1.2 mg/dL Final  06/30/2008 05:00 AM 0.88 0.4 - 1.2 mg/dL Final  06/29/2008 05:32 AM 1.04 DELTA CHECK  NOTED 0.4 - 1.2 mg/dL Final  06/28/2008 06:10 AM 0.62 0.4 - 1.2 mg/dL Final  06/27/2008 06:15 AM 0.61 0.4 - 1.2 mg/dL Final  06/26/2008 08:21 PM 0.56 0.4 - 1.2 mg/dL Final  06/26/2008 04:52 AM 0.49 0.4 - 1.2 mg/dL Final  06/25/2008 02:50 PM 0.62 0.4 - 1.2 mg/dL Final  06/25/2008 06:06 AM 0.57 0.4 - 1.2 mg/dL Final  06/24/2008 06:00 AM 0.60 0.4 - 1.2 mg/dL Final  06/23/2008 05:40 AM 0.65 0.4 - 1.2 mg/dL Final  06/22/2008 07:58 PM 0.53 0.4 - 1.2 mg/dL Final   Recent Labs  Lab 09/10/19 0021  NA 135  K 3.4*  CL 99  CO2 26  GLUCOSE 281*  BUN 14  CREATININE 2.45*  CALCIUM 8.1*   Recent Labs  Lab 09/10/19 0021  WBC 9.4  NEUTROABS 6.7  HGB 10.0*  HCT 32.3*  MCV 100.6*  PLT 171   Liver Function Tests: Recent Labs  Lab 09/10/19 0021  AST 15  ALT 17  ALKPHOS 154*  BILITOT 1.2  PROT 6.6  ALBUMIN 2.9*   No results for input(s): LIPASE, AMYLASE in the last 168 hours. No results for input(s): AMMONIA in the last 168 hours. Cardiac Enzymes: No results for input(s): CKTOTAL, CKMB, CKMBINDEX, TROPONINI in the last 168 hours. Iron Studies: No results for input(s): IRON, TIBC, TRANSFERRIN, FERRITIN in the last 72 hours. PT/INR: @LABRCNTIP (inr:5)  Xrays/Other Studies: ) Results for orders placed or performed during the hospital encounter of 09/09/19 (from the past 48 hour(s))  POC occult blood, ED     Status: None   Collection Time: 09/09/19 11:57 PM  Result Value Ref Range   Fecal Occult Bld NEGATIVE NEGATIVE  CBC with Differential/Platelet     Status: Abnormal   Collection Time: 09/10/19 12:21 AM  Result Value Ref Range   WBC 9.4 4.0 - 10.5 K/uL   RBC 3.21 (L) 3.87 - 5.11 MIL/uL   Hemoglobin 10.0 (L) 12.0 - 15.0 g/dL   HCT 32.3 (L) 36.0 - 46.0 %   MCV 100.6 (H) 80.0 - 100.0 fL   MCH 31.2 26.0 - 34.0 pg   MCHC 31.0 30.0 - 36.0 g/dL   RDW 16.6 (H) 11.5 - 15.5 %   Platelets 171 150 - 400 K/uL   nRBC 0.0 0.0 - 0.2 %   Neutrophils Relative % 72 %   Neutro Abs 6.7 1.7  - 7.7 K/uL   Lymphocytes Relative 16 %   Lymphs Abs 1.5 0.7 - 4.0 K/uL   Monocytes Relative 8 %   Monocytes Absolute 0.8 0.1 - 1.0 K/uL   Eosinophils Relative 3 %   Eosinophils Absolute 0.3 0.0 - 0.5 K/uL   Basophils Relative 1 %   Basophils Absolute  0.1 0.0 - 0.1 K/uL   Immature Granulocytes 0 %   Abs Immature Granulocytes 0.04 0.00 - 0.07 K/uL    Comment: Performed at [facility], [address], [place], [place] [address]  Comprehensive metabolic panel     Status: Abnormal   Collection Time: 09/10/19 12:21 AM  Result Value Ref Range   Sodium 135 135 - 145 mmol/L   Potassium 3.4 (L) 3.5 - 5.1 mmol/L   Chloride 99 98 - 111 mmol/L   CO2 26 22 - 32 mmol/L   Glucose, Bld 281 (H) 70 - 99 mg/dL    Comment: Glucose reference range applies only to samples taken after fasting for at least 8 hours.   BUN 14 6 - 20 mg/dL   Creatinine, Ser 2.45 (H) 0.44 - 1.00 mg/dL   Calcium 8.1 (L) 8.9 - 10.3 mg/dL   Total Protein 6.6 6.5 - 8.1 g/dL   Albumin 2.9 (L) 3.5 - 5.0 g/dL   AST 15 15 - 41 U/L   ALT 17 0 - 44 U/L   Alkaline Phosphatase 154 (H) 38 - 126 U/L   Total Bilirubin 1.2 0.3 - 1.2 mg/dL   GFR calc non Af Amer 22 (L) >60 mL/min   GFR calc Af Amer 26 (L) >60 mL/min   Anion gap 10 5 - 15    Comment: Performed at [facility], [address] [place], [place] [address]  Lactic acid, plasma     Status: None   Collection Time: 09/10/19 12:21 AM  Result Value Ref Range   Lactic Acid, Venous 0.8 0.5 - 1.9 mmol/L    Comment: Performed at [facility], [address] [place], [place] [address]  Blood culture (routine x 2)     Status: None (Preliminary result)   Collection Time: 09/10/19 12:22 AM   Specimen: BLOOD  Result Value Ref Range   Specimen Description BLOOD BLOOD RIGHT WRIST    Special Requests      BOTTLES DRAWN AEROBIC AND ANAEROBIC Blood Culture adequate volume   Culture      NO GROWTH < 12 HOURS Performed at [facility], [address], [place], [place] [address]    Report  Status PENDING   Blood culture (routine x 2)     Status: None (Preliminary result)   Collection Time: 09/10/19 12:28 AM   Specimen: BLOOD  Result Value Ref Range   Specimen Description BLOOD RIGHT ANTECUBITAL    Special Requests      BOTTLES DRAWN AEROBIC AND ANAEROBIC Blood Culture adequate volume   Culture      NO GROWTH < 12 HOURS Performed at [facility], [address], [place], [place] [address]    Report Status PENDING   Urinalysis, Routine w reflex microscopic     Status: Abnormal   Collection Time: 09/10/19  1:28 AM  Result Value Ref Range   Color, Urine YELLOW YELLOW   APPearance TURBID (A) CLEAR   Specific Gravity, Urine >1.030 (H) 1.005 - 1.030   pH 6.0 5.0 - 8.0   Glucose, UA 250 (A) NEGATIVE mg/dL   Hgb urine dipstick LARGE (A) NEGATIVE   Bilirubin Urine NEGATIVE NEGATIVE   Ketones, ur NEGATIVE NEGATIVE mg/dL   Protein, ur >300 (A) NEGATIVE mg/dL   Nitrite NEGATIVE NEGATIVE   Leukocytes,Ua LARGE (A) NEGATIVE    Comment: Performed at [facility], [address], [place],  [address]  Urinalysis, Microscopic (reflex)     Status: Abnormal   Collection Time: 09/10/19  1:28 AM  Result Value Ref Range   RBC / HPF 6-10 0 - 5 RBC/hpf   WBC, UA >50 0 - 5 WBC/hpf   Bacteria, UA MANY (A) NONE SEEN   Squamous Epithelial / LPF 6-10 0 - 5    Comment: Performed at [facility], [address], [place], [place] [address]  Respiratory Panel by RT PCR (Flu A&B, Covid) - Nasopharyngeal Swab     Status: None   Collection Time: 09/10/19  1:29 AM   Specimen: Nasopharyngeal Swab  Result Value Ref Range   SARS Coronavirus 2 by RT PCR NEGATIVE NEGATIVE    Comment: (NOTE) SARS-CoV-2 target nucleic acids are NOT DETECTED. The SARS-CoV-2 RNA is generally detectable in upper respiratoy specimens during the acute phase of infection. The lowest concentration of SARS-CoV-2 viral copies this assay can detect is 131 copies/mL. A negative result does not preclude SARS-Cov-2 infection  and should not be used as the sole basis for treatment or other patient management decisions. A negative result may occur with  improper specimen collection/handling, submission of specimen other than nasopharyngeal swab, presence of viral mutation(s) within the areas targeted by this assay, and inadequate number of viral copies (<131 copies/mL). A negative result must be combined with clinical observations, patient history, and epidemiological information. The expected result is Negative. Fact Sheet for Patients:  PinkCheek.be Fact Sheet for Healthcare Providers:  GravelBags.it This test is not yet ap proved or cleared by the [place] FDA and  has been authorized for detection and/or diagnosis of SARS-CoV-2 by FDA under an Emergency Use Authorization (EUA). This EUA will remain  in effect (meaning this test can be used) for the duration of the COVID-19 declaration under Section 564(b)(1) of the Act, 21 U.S.C. section 360bbb-3(b)(1), unless the authorization is terminated or revoked sooner.    Influenza A by PCR NEGATIVE NEGATIVE   Influenza B by PCR NEGATIVE NEGATIVE    Comment: (NOTE) The Xpert Xpress SARS-CoV-2/FLU/RSV assay is intended as an aid in  the diagnosis of influenza from Nasopharyngeal swab specimens and  should not be used as a sole basis for treatment. Nasal washings and  aspirates are unacceptable for Xpert Xpress SARS-CoV-2/FLU/RSV  testing. Fact Sheet for Patients: PinkCheek.be Fact Sheet for Healthcare Providers: GravelBags.it This test is not yet approved or cleared by the [place] FDA and  has been authorized for detection and/or diagnosis of SARS-CoV-2 by  FDA under an Emergency Use Authorization (EUA). This EUA will remain  in effect (meaning this test can be used) for the duration of the  Covid-19 declaration under Section 564(b)(1)  of the Act, 21  U.S.C. section 360bbb-3(b)(1), unless the authorization is  terminated or revoked. Performed at [facility], [address] [place], [place] [address]    CT Head Wo Contrast  Result Date: 09/10/2019 CLINICAL DATA:  Weakness, locking EXAM: CT HEAD WITHOUT CONTRAST TECHNIQUE: Contiguous axial images were obtained from the base of the skull through the vertex without intravenous contrast. COMPARISON:  August 08, 2019 FINDINGS: Brain: No evidence of acute territorial infarction, hemorrhage, hydrocephalus,extra-axial collection or mass lesion/mass effect. Normal gray-white differentiation. Ventricles are normal in size and contour. Vascular: No hyperdense vessel or unexpected calcification. Skull: The skull is intact. No fracture or focal lesion identified. Sinuses/Orbits: The visualized paranasal sinuses and mastoid air cells are clear. The orbits and globes intact. Other: None IMPRESSION: No acute intracranial abnormality. Electronically Signed   By: Prudencio Pair M.D.   On: 09/10/2019 00:18  MR CERVICAL SPINE WO CONTRAST  Result Date: 09/10/2019 CLINICAL DATA:  Neuro deficit, acute, stroke suspected. Bilateral lower extremity weakness. Bladder stool contents. Symptoms 4 weeks. End-stage renal disease. Hospitalization approximately 1 month ago. EXAM: MRI CERVICAL SPINE WITHOUT CONTRAST TECHNIQUE: Multiplanar, multisequence MR imaging of the cervical spine was performed. No intravenous contrast was administered. COMPARISON:  CT of the cervical spine 08/13/2019 FINDINGS: Alignment: Significant listhesis is present. There is some straightening of the normal cervical lordosis. Vertebrae: Marrow signal and vertebral body heights are normal. Cord: The study is mildly degraded by patient motion. No abnormal cord signal or morphology is present. Signal changes on the T2 sagittal sequence are not present on the axial or T2 STIR sequence. Posterior Fossa, vertebral arteries, paraspinal tissues:  Craniocervical junction is normal. Flow is present in the vertebral arteries bilaterally. Visualized intracranial contents are normal. Disc levels: No significant cervical disc protrusion or stenosis is present. The central canal and foramina are patent. IMPRESSION: 1. No acute or focal lesion to explain the patient's symptoms. 2. Straightening of the normal cervical lordosis is nonspecific, but can be seen in the setting of muscle strain or ongoing pain. Electronically Signed   By: San Morelle M.D.   On: 09/10/2019 06:50   DG Chest Portable 1 View  Result Date: 09/10/2019 CLINICAL DATA:  Weakness and shortness of breath EXAM: PORTABLE CHEST 1 VIEW COMPARISON:  08/08/2019 FINDINGS: Cardiac shadow is stable. Dialysis catheter is now seen in satisfactory position. Pleural effusions are noted right greater than left with central vascular congestion. This may be related to a degree of volume overload. Bibasilar atelectasis is seen. Patchy opacities are noted which may be related to atypical pneumonia. Clinical correlation is recommended. IMPRESSION: Vascular congestion and effusions likely related to volume overload. This is new from the prior exam. Patchy opacities are noted in both lungs suspicious for atypical pneumonia. Correlation with COVID-19 testing is recommended. Electronically Signed   By: Inez Catalina M.D.   On: 09/10/2019 00:28    PMH:   Past Medical History:  Diagnosis Date  . Anemia   . Cataracts, bilateral    surgery to remove  . Diabetes mellitus    type 2  . ESRD on hemodialysis ([place])   . GERD (gastroesophageal reflux disease)   . Hypertension   . SVD (spontaneous vaginal delivery)    x 4    PSH:   Past Surgical History:  Procedure Laterality Date  . AV FISTULA PLACEMENT Left 08/17/2019   Procedure: LEFT BRACHIAL CEPHALIC ARTERIOVENOUS (AV) FISTULA;  Surgeon: Angelia Mould, MD;  Location: [place];  Service: Vascular;  Laterality: Left;  . EYE SURGERY Bilateral     cataracts removed  . FEMUR IM NAIL Left 10/28/2018   Procedure: RETROGRADE FEMORAL NAILING;  Surgeon: Meredith Pel, MD;  Location: [place];  Service: Orthopedics;  Laterality: Left;  . IM NAILING FEMORAL SHAFT RETROGRADE Left 10/28/2018  . IR FLUORO GUIDE CV LINE RIGHT  08/11/2019  . IR US GUIDE VASC ACCESS RIGHT  08/11/2019  . KNEE SURGERY    . TUBAL LIGATION      Allergies: No Known Allergies  Medications:   Prior to Admission medications   Medication Sig Start Date End Date Taking? Authorizing Provider  amLODipine (NORVASC) 5 MG tablet Take 5 mg by mouth daily.   Yes [provider]  carvedilol (COREG) 3.125 MG tablet Take 1 tablet (3.125 mg total) by mouth 2 (two) times daily with a meal. 08/25/19 08/24/20 Yes  Hoyt Koch, MD  epoetin alfa (EPOGEN) 3000 UNIT/ML injection Inject 3,000 Units into the skin every 14 (fourteen) days.  06/12/19  Yes [provider]  gabapentin (NEURONTIN) 100 MG capsule Take 1 capsule (100 mg total) by mouth 2 (two) times daily. 08/25/19  Yes Hoyt Koch, MD  levothyroxine (SYNTHROID) 25 MCG tablet Take 1 tablet (25 mcg total) by mouth daily. 08/25/19  Yes Hoyt Koch, MD  lovastatin (MEVACOR) 40 MG tablet Take 1 tablet (40 mg total) by mouth at bedtime. 08/25/19  Yes Hoyt Koch, MD  midodrine (PROAMATINE) 5 MG tablet Take 1 tablet (5 mg total) by mouth 3 (three) times daily with meals. Patient taking differently: Take 5 mg by mouth See admin instructions. Take 5mg  three times daily on dialysis days. Take 5mg  at breakfast and bedtime, then take two tablets before treatment on dialysis days 08/25/19  Yes Hoyt Koch, MD  multivitamin (RENA-VIT) TABS tablet Take 1 tablet by mouth at bedtime. 08/25/19  Yes Hoyt Koch, MD  pantoprazole (PROTONIX) 40 MG tablet Take 1 tablet (40 mg total) by mouth daily. 08/25/19 08/24/20 Yes Hoyt Koch, MD  acetaminophen (TYLENOL) 325 MG tablet Take  2 tablets (650 mg total) by mouth every 6 (six) hours as needed for mild pain or headache (or Fever >/= 101). Patient not taking: Reported on 07/23/2019 08/28/18   Barton Dubois, MD  insulin aspart protamine- aspart (NOVOLOG MIX 70/30) (70-30) 100 UNIT/ML injection Inject 0.1 mLs (10 Units total) into the skin 2 (two) times daily with a meal. Patient not taking: Reported on 09/09/2019 08/25/19   Hoyt Koch, MD  midodrine (PROAMATINE) 10 MG tablet Take 1 tablet (10 mg total) by mouth Every Tuesday,Thursday,and Saturday with dialysis. Patient not taking: Reported on 09/09/2019 08/27/19   Hoyt Koch, MD    Discontinued Meds:  There are no discontinued medications.  Social History:  reports that she quit smoking about 24 years ago. Her smoking use included cigarettes. She has never used smokeless tobacco. She reports that she does not drink alcohol or use drugs.  Family History:   Family History  Problem Relation Age of Onset  . Diabetes Mother   . Hypertension Mother   . Bipolar disorder Mother   . Brain cancer Maternal Aunt   . Heart disease Maternal Grandmother   . Diabetes Maternal Grandmother   . Breast cancer Maternal Aunt   . Diabetes Sister     Blood pressure (!) 185/91, pulse 94, temperature 98.1 F (36.7 C), resp. rate 20, height 5\' 3"  (1.6 m), weight 81.6 kg, last menstrual period 12/17/2016, SpO2 99 %. General appearance: alert, cooperative and appears stated age Head: Normocephalic, without obvious abnormality, atraumatic Eyes: negative Back: symmetric, no curvature. ROM normal. No CVA tenderness. Resp: clear to auscultation bilaterally Cardio: S1, S2 normal GI: soft, non-tender; bowel sounds normal; no masses,  no organomegaly Extremities: edema none Pulses: 2+ and symmetric       Daryan Cagley, [name], MD 09/10/2019, 9:43 AM

## 2019-09-11 DIAGNOSIS — R531 Weakness: Secondary | ICD-10-CM

## 2019-09-11 DIAGNOSIS — N3000 Acute cystitis without hematuria: Secondary | ICD-10-CM

## 2019-09-11 DIAGNOSIS — E1121 Type 2 diabetes mellitus with diabetic nephropathy: Secondary | ICD-10-CM

## 2019-09-11 DIAGNOSIS — N186 End stage renal disease: Secondary | ICD-10-CM

## 2019-09-11 DIAGNOSIS — R29898 Other symptoms and signs involving the musculoskeletal system: Secondary | ICD-10-CM

## 2019-09-11 DIAGNOSIS — F329 Major depressive disorder, single episode, unspecified: Secondary | ICD-10-CM

## 2019-09-11 DIAGNOSIS — I10 Essential (primary) hypertension: Secondary | ICD-10-CM

## 2019-09-11 DIAGNOSIS — E1159 Type 2 diabetes mellitus with other circulatory complications: Secondary | ICD-10-CM

## 2019-09-11 DIAGNOSIS — E7800 Pure hypercholesterolemia, unspecified: Secondary | ICD-10-CM

## 2019-09-11 DIAGNOSIS — Z992 Dependence on renal dialysis: Secondary | ICD-10-CM

## 2019-09-11 LAB — FOLATE RBC
Folate, Hemolysate: 421 ng/mL
Folate, RBC: 1312 ng/mL (ref >498)
Hematocrit: 32.1 % — ABNORMAL LOW (ref 34.0–46.6)

## 2019-09-11 LAB — BASIC METABOLIC PANEL
Anion gap: 15 (ref 5–15)
BUN: 19 mg/dL (ref 6–20)
CO2: 25 mmol/L (ref 22–32)
Calcium: 8.7 mg/dL — ABNORMAL LOW (ref 8.9–10.3)
Chloride: 101 mmol/L (ref 98–111)
Creatinine, Ser: 3.62 mg/dL — ABNORMAL HIGH (ref 0.44–1.00)
GFR calc Af Amer: 16 mL/min — ABNORMAL LOW (ref >60)
GFR calc non Af Amer: 14 mL/min — ABNORMAL LOW (ref >60)
Glucose, Bld: 150 mg/dL — ABNORMAL HIGH (ref 70–99)
Potassium: 4.1 mmol/L (ref 3.5–5.1)
Sodium: 141 mmol/L (ref 135–145)

## 2019-09-11 LAB — PHOSPHORUS: Phosphorus: 4 mg/dL (ref 2.5–4.6)

## 2019-09-11 LAB — CBC
HCT: 32.1 % — ABNORMAL LOW (ref 36.0–46.0)
Hemoglobin: 9.7 g/dL — ABNORMAL LOW (ref 12.0–15.0)
MCH: 30.8 pg (ref 26.0–34.0)
MCHC: 30.2 g/dL (ref 30.0–36.0)
MCV: 101.9 fL — ABNORMAL HIGH (ref 80.0–100.0)
Platelets: 148 10*3/uL — ABNORMAL LOW (ref 150–400)
RBC: 3.15 MIL/uL — ABNORMAL LOW (ref 3.87–5.11)
RDW: 16.7 % — ABNORMAL HIGH (ref 11.5–15.5)
WBC: 6.7 10*3/uL (ref 4.0–10.5)
nRBC: 0 % (ref 0.0–0.2)

## 2019-09-11 LAB — GLUCOSE, CAPILLARY
Glucose-Capillary: 135 mg/dL — ABNORMAL HIGH (ref 70–99)
Glucose-Capillary: 182 mg/dL — ABNORMAL HIGH (ref 70–99)
Glucose-Capillary: 96 mg/dL (ref 70–99)
Glucose-Capillary: 153 mg/dL — ABNORMAL HIGH (ref 70–99)

## 2019-09-11 LAB — URINE CULTURE

## 2019-09-11 MED ORDER — PENTAFLUOROPROP-TETRAFLUOROETH EX AERO: 1.0000 "application " | INHALATION_SPRAY | CUTANEOUS | Status: DC | PRN

## 2019-09-11 MED ORDER — HEPARIN SODIUM (PORCINE) 1000 UNIT/ML IJ SOLN
INTRAMUSCULAR | Status: AC
  Filled 2019-09-11: qty 1

## 2019-09-11 MED ORDER — HEPARIN SODIUM (PORCINE) 1000 UNIT/ML DIALYSIS: 1000.0000 [IU] | INTRAMUSCULAR | Status: DC | PRN

## 2019-09-11 MED ORDER — SODIUM CHLORIDE 0.9 % IV SOLN: 100.0000 mL | INTRAVENOUS | Status: DC | PRN

## 2019-09-11 MED ORDER — LIDOCAINE HCL (PF) 1 % IJ SOLN: 5.0000 mL | INTRAMUSCULAR | Status: DC | PRN

## 2019-09-11 MED ORDER — ALTEPLASE 2 MG IJ SOLR: 2.0000 mg | Freq: Once | INTRAMUSCULAR | Status: DC | PRN

## 2019-09-11 MED ORDER — LIDOCAINE-PRILOCAINE 2.5-2.5 % EX CREA: 1.0000 "application " | TOPICAL_CREAM | CUTANEOUS | Status: DC | PRN

## 2019-09-11 NOTE — Progress Notes (Signed)
[facility] Progress Note   49 y.o. female DM with polyneuropathy, ESRD MWF [facility] initiated HD late Feb 2021 here with leg weakness, bladder and bowel incontinence for 4 weeks. She also has saddle anesthesia and uses a walker but did sustain a left leg fracture 10/2018. She also has numbness in both legs. She denies f/c/n/v; also denies CP/ dyspnea/ cough/ myalgias. CT head negative for acute abnormalities; MRI cervical spine without contrast showed no acute or focal lesion to explain the patient's symptoms with nonspecific finding of straightening of the normal cervical lordosis.  Assessment/ Plan:   1. ESRD - recent start on dialysis at [facility] MWF last dialysis was a full treatment on Wednesday.  - If she goes for an MRI w/ contrast please let us know and we will dialyze afterwards to decrease risk of NSF. Otherwise planning on HD today. 2. Renal osteodystrophy - will check phos in am for binder management. 3. Anemia - transfuse as needed -> @ goal; will also check iron studies and give ESA.  4. Leg weakness and incontinence r/o cord compression and caudal equina syndrome - w/u per primary, couldn't tolerate MRI earlier and may need general anesthesia for the study.  Subjective:   No events overnight. Denies f/c/n/v.   Objective:   BP (!) 163/96 (BP Location: Right Arm)   Pulse 96   Temp 97.9 F (36.6 C) (Oral)   Resp (!) 21   Ht 5\' 3"  (1.6 m)   Wt 76.3 kg   LMP 12/17/2016   SpO2 97%   BMI 29.80 kg/m   Intake/Output Summary (Last 24 hours) at 09/11/2019 1148 Last data filed at 09/11/2019 5284 Gross per 24 hour  Intake 520 ml  Output 200 ml  Net 320 ml   Weight change: -0.9 kg  Physical Exam: General appearance: alert, cooperative and appears stated age Head: Normocephalic, without obvious abnormality, atraumatic Eyes: negative Back: symmetric, no curvature. ROM normal. No CVA tenderness. Resp: clear to auscultation bilaterally Cardio:  S1, S2 normal GI: soft, non-tender; bowel sounds normal; no masses,  no organomegaly Extremities: edema none Pulses: 2+ and symmetric Access: RIJ TC, LUA BCF (bruit but needs work)  Imaging: CT Head Wo Contrast  Result Date: 09/10/2019 CLINICAL DATA:  Weakness, locking EXAM: CT HEAD WITHOUT CONTRAST TECHNIQUE: Contiguous axial images were obtained from the base of the skull through the vertex without intravenous contrast. COMPARISON:  August 08, 2019 FINDINGS: Brain: No evidence of acute territorial infarction, hemorrhage, hydrocephalus,extra-axial collection or mass lesion/mass effect. Normal gray-white differentiation. Ventricles are normal in size and contour. Vascular: No hyperdense vessel or unexpected calcification. Skull: The skull is intact. No fracture or focal lesion identified. Sinuses/Orbits: The visualized paranasal sinuses and mastoid air cells are clear. The orbits and globes intact. Other: None IMPRESSION: No acute intracranial abnormality. Electronically Signed   By: Prudencio Pair M.D.   On: 09/10/2019 00:18   MR CERVICAL SPINE WO CONTRAST  Result Date: 09/10/2019 CLINICAL DATA:  Neuro deficit, acute, stroke suspected. Bilateral lower extremity weakness. Bladder stool contents. Symptoms 4 weeks. End-stage renal disease. Hospitalization approximately 1 month ago. EXAM: MRI CERVICAL SPINE WITHOUT CONTRAST TECHNIQUE: Multiplanar, multisequence MR imaging of the cervical spine was performed. No intravenous contrast was administered. COMPARISON:  CT of the cervical spine 08/13/2019 FINDINGS: Alignment: Significant listhesis is present. There is some straightening of the normal cervical lordosis. Vertebrae: Marrow signal and vertebral body heights are normal. Cord: The study is mildly degraded by patient motion. No abnormal  cord signal or morphology is present. Signal changes on the T2 sagittal sequence are not present on the axial or T2 STIR sequence. Posterior Fossa, vertebral arteries,  paraspinal tissues: Craniocervical junction is normal. Flow is present in the vertebral arteries bilaterally. Visualized intracranial contents are normal. Disc levels: No significant cervical disc protrusion or stenosis is present. The central canal and foramina are patent. IMPRESSION: 1. No acute or focal lesion to explain the patient's symptoms. 2. Straightening of the normal cervical lordosis is nonspecific, but can be seen in the setting of muscle strain or ongoing pain. Electronically Signed   By: San Morelle M.D.   On: 09/10/2019 06:50   DG Chest Portable 1 View  Result Date: 09/10/2019 CLINICAL DATA:  Weakness and shortness of breath EXAM: PORTABLE CHEST 1 VIEW COMPARISON:  08/08/2019 FINDINGS: Cardiac shadow is stable. Dialysis catheter is now seen in satisfactory position. Pleural effusions are noted right greater than left with central vascular congestion. This may be related to a degree of volume overload. Bibasilar atelectasis is seen. Patchy opacities are noted which may be related to atypical pneumonia. Clinical correlation is recommended. IMPRESSION: Vascular congestion and effusions likely related to volume overload. This is new from the prior exam. Patchy opacities are noted in both lungs suspicious for atypical pneumonia. Correlation with COVID-19 testing is recommended. Electronically Signed   By: Inez Catalina M.D.   On: 09/10/2019 00:28    Labs: BMET Recent Labs  Lab 09/10/19 0021 09/11/19 0626  NA 135 141  K 3.4* 4.1  CL 99 101  CO2 26 25  GLUCOSE 281* 150*  BUN 14 19  CREATININE 2.45* 3.62*  CALCIUM 8.1* 8.7*   CBC Recent Labs  Lab 09/10/19 0021 09/11/19 0626  WBC 9.4 6.7  NEUTROABS 6.7  --   HGB 10.0* 9.7*  HCT 32.3* 32.1*  MCV 100.6* 101.9*  PLT 171 148*    Medications:    . amLODipine  5 mg Oral Daily  . carvedilol  3.125 mg Oral BID WC  . Chlorhexidine Gluconate Cloth  6 each Topical Q0600  . gabapentin  300 mg Oral BID  . heparin  5,000  Units Subcutaneous Q8H  . insulin aspart  0-5 Units Subcutaneous QHS  . insulin aspart  0-6 Units Subcutaneous TID WC  . levothyroxine  25 mcg Oral QAC breakfast  . multivitamin  1 tablet Oral QHS  . pantoprazole  40 mg Oral Daily  . pravastatin  40 mg Oral q1800  . sertraline  25 mg Oral Daily      Otelia Santee, MD 09/11/2019, 11:48 AM

## 2019-09-11 NOTE — Progress Notes (Signed)
PROGRESS NOTE    Melanie Hall  OMA:[id number] DOB: 06/10/1971 DOA: 09/09/2019 PCP: [facility] [place]    Brief Narrative:  HPI per Dr. Timmothy Euler Melanie Hall is a 49 y.o. female with medical history significant of HTN; DM; and ESRD recently started on HD presenting with weakness.  She is not able to walk.  This has been going on for 4 weeks.  She is not certain why this is happened.  She started HD 4 weeks ago and the inability to walk started at the same time.  Her R arm is also weak.  She is having numbness and tingling of her arms and legs.  +urinary and fecal incontinence for the same length of time.  Nothing changed yesterday to make her come to the ER.  She has a mild headache.  No visual disturbance.  No dysphagia.  The patient appeared frustrated and reluctant to provide history.  I spoke with her daughter, who brought her to the ER.  She has been been out of the hospital for 2-3 weeks.  During the time she was hospitalized, she only got up once to walk and has been unable to walk since.  [facility] has been helping at home, and they raised concerns about her inability to walk.  She is not taking medications, no insulin since discharge.  Her daughter is taking her to HD qMWF and otherwise only wants fast food, doesn't want to eat right.  She has a couple of bedsores.  Her daughter this is physical and psychological.  She had prior h/o leg surgery and refused therapy and mostly stopped moving around and her health declined accordingly.  She walked with a walker until a couple of months ago, but never really walked that much after her leg surgery.  Yesterday, while her daughter talked to the triage nurse, her mother stood with the triage nurse but she won't do that at home.  She voids/stools at home and will not attempt to get out of bed to use the bedside commode.  Her daughter thinks she needs SNF rehab, but the patient has refused that.   They are thinking APS may need to be involved.  Her  sister moved in to her house 2 days ago and her mother didn't like it that she was making her move too much.  There is someone bringing her fast food and her mother refuses to eat healthy foods. At this point, "we tried it, we tried to let her be at home..." but it isn't working.  Strong FH of mental health disorders; her 2nd husband was killed; daughter agrees with psychiatry evaluation.   ED Course: Has UTI and needs MRI of spine.  Showed up at [facility], concern for cauda equina, unable to get MRI effectively.  Likely needs anesthesia for MRI.  Acute vs. Chronic weakness, urinary incontinence.  Teleneuro recommended MRI while at [facility].   Assessment & Plan:   Principal Problem:   Debility Active Problems:   Type 2 diabetes with nephropathy ([place])   HYPERCHOLESTEROLEMIA   Hypertension associated with diabetes ([place])   ESRD (end stage renal disease) on dialysis ([place])   Depression   Incontinence in female   Pressure ulcer  1 debility with fecal/urinary incontinence Questionable etiology.  Concern for cauda equina syndrome. Patient noted to have remote history of leg surgery(left femur fracture April 2020) and since then has not been very mobile at that time.  Patient noted to have been recently hospitalized from January 30-February  16 and started on hemodialysis and since then and really getting out of bed.  Patient states feels extremely weak after hemodialysis sessions.  Per admitting physician patient noted to have absent reflexes felt secondary to diabetic polyneuropathy.  Patient with bowel and bladder incontinence concerning for cauda equina syndrome however.  Admitting MD it was felt patient's incontinence could also be more associated with patient being unable/unwilling to get out of bed for toileting.  MRI C-spine with no acute abnormalities noted.  It was noted patient unable to tolerate MRI even with anxiolytics and as such further MRI necessary may need to be under general anesthesia.  RBC  folate at 1312, CRP at 5, vitamin B12 at 818, lactic acid level at 0.8, sed rate at 45, TSH at 3.242.  MRI T-spine and L-spine pending.  Neurontin increased to 300 mg twice daily per neurology recommendations.  PT/OT.  Per neurology if MRI negative will need outpatient EMG/nerve conduction study with neurology. Patient seen in consultation by neurology who are following.  2.  End-stage renal disease MWF Patient recently started on hemodialysis at the [facility] Monday Wednesday Friday.  Patient noted to have had a full treatment on Wednesday.  Patient seen in consultation by nephrology and patient scheduled for hemodialysis today.  Neurology.  3.  Anemia of chronic disease Anemia panel from 08/09/2019 with iron of 22, TIBC of 203, transferrin of 145, folate of 1440.  Hemoglobin currently 9.7 this morning.  Per nephrology.  4.  Depression Concerned that patient's symptoms may have a psychogenic component in place.  Psychiatry consultation obtained and was felt patient does not meet inpatient criteria and recommended social work follow-up with daughter due to voice concerns.  Patient started on Zoloft.  5.  Hypothyroidism Continue home dose Synthroid.  6.  Hypertension Continue current regimen of Norvasc and Coreg.  Patient on hemodialysis.  7.  Hyperlipidemia Continue statin.  8.  Diabetes mellitus type 2 Hemoglobin A1c of 7.2.  CBG of 135 this morning.  Continue sliding scale insulin.   DVT prophylaxis: Heparin Code Status: Full Family Communication: Updated patient.  No family at bedside. Disposition Plan:  . Patient came from: Home            . Anticipated d/c place: Likely skilled nursing facility . Barriers to d/c OR conditions which need to be met to effect a safe d/c: Likely skilled nursing facility once cleared by neurology, nephrology and evaluation by PT/OT with MRI resulted.   Consultants:   Telemetry neurology: Dr. Roanna Raider 09/10/2019  Neuro hospitalist: Dr.  Lorraine Lax 09/10/2019  Psychiatry: Dr. Dwyane Dee 09/11/2019  Nephrology: Dr. Augustin Coupe 09/10/2019  Procedures:   MRI T-spine pending  CT head 09/10/2019  Chest x-ray 09/10/2019  MRI C-spine 09/10/2019  Antimicrobials:   IV Rocephin 09/10/2019>>> 09/11/2019   Subjective: Patient laying in bed sleeping however easily arousable.  States extreme fatigue and weakness usually post hemodialysis.  Denies any chest pain or shortness of breath.  Does endorse bowel and bladder incontinence this morning.  Objective: Vitals:   09/10/19 1930 09/10/19 2034 09/11/19 0447 09/11/19 0852  BP:  (!) 160/98 (!) 169/88 (!) 163/96  Pulse:  91 90 96  Resp:  16 16 (!) 21  Temp:  98.4 F (36.9 C) 98.1 F (36.7 C) 97.9 F (36.6 C)  TempSrc:  Oral Oral Oral  SpO2:  99% 99% 97%  Weight: 76.3 kg     Height:        Intake/Output Summary (Last 24 hours)  at 09/11/2019 1149 Last data filed at 09/11/2019 0852 Gross per 24 hour  Intake 520 ml  Output 200 ml  Net 320 ml   Filed Weights   09/09/19 2044 09/09/19 2045 09/10/19 1930  Weight: 77.2 kg 81.6 kg 76.3 kg    Examination:  General exam: NAD Respiratory system: Clear to auscultation. Respiratory effort normal. Cardiovascular system: S1 & S2 heard, RRR. No JVD, murmurs, rubs, gallops or clicks. No pedal edema. Gastrointestinal system: Abdomen is nondistended, soft and nontender. No organomegaly or masses felt. Normal bowel sounds heard. Central nervous system: Alert and oriented. No focal neurological deficits.  Moving extremities spontaneously. Extremities: Symmetric 5 x 5 power. Skin: No rashes, lesions or ulcers Psychiatry: Judgement and insight appear normal. Mood & affect appropriate.     Data Reviewed: I have personally reviewed following labs and imaging studies  CBC: Recent Labs  Lab 09/10/19 0021 09/11/19 0626  WBC 9.4 6.7  NEUTROABS 6.7  --   HGB 10.0* 9.7*  HCT 32.3* 32.1*  MCV 100.6* 101.9*  PLT 171 474*   Basic Metabolic Panel: Recent Labs    Lab 09/10/19 0021 09/11/19 0626  NA 135 141  K 3.4* 4.1  CL 99 101  CO2 26 25  GLUCOSE 281* 150*  BUN 14 19  CREATININE 2.45* 3.62*  CALCIUM 8.1* 8.7*   GFR: Estimated Creatinine Clearance: 18.4 mL/min (A) (by C-G formula based on SCr of 3.62 mg/dL (H)). Liver Function Tests: Recent Labs  Lab 09/10/19 0021  AST 15  ALT 17  ALKPHOS 154*  BILITOT 1.2  PROT 6.6  ALBUMIN 2.9*   No results for input(s): LIPASE, AMYLASE in the last 168 hours. No results for input(s): AMMONIA in the last 168 hours. Coagulation Profile: No results for input(s): INR, PROTIME in the last 168 hours. Cardiac Enzymes: No results for input(s): CKTOTAL, CKMB, CKMBINDEX, TROPONINI in the last 168 hours. BNP (last 3 results) No results for input(s): PROBNP in the last 8760 hours. HbA1C: Recent Labs    09/10/19 1820  HGBA1C 7.2*   CBG: Recent Labs  Lab 09/10/19 1214 09/10/19 1707 09/10/19 2037 09/11/19 0717 09/11/19 1122  GLUCAP 201* 178* 148* 135* 182*   Lipid Profile: No results for input(s): CHOL, HDL, LDLCALC, TRIG, CHOLHDL, LDLDIRECT in the last 72 hours. Thyroid Function Tests: Recent Labs    09/10/19 1544  TSH 3.242   Anemia Panel: Recent Labs    09/10/19 1544  VITAMINB12 818   Sepsis Labs: Recent Labs  Lab 09/10/19 0021  LATICACIDVEN 0.8    Recent Results (from the past 240 hour(s))  Blood culture (routine x 2)     Status: None (Preliminary result)   Collection Time: 09/10/19 12:22 AM   Specimen: BLOOD  Result Value Ref Range Status   Specimen Description BLOOD BLOOD RIGHT WRIST  Final   Special Requests   Final    BOTTLES DRAWN AEROBIC AND ANAEROBIC Blood Culture adequate volume   Culture   Final    NO GROWTH 1 DAY Performed at [facility], [address], [place], [place] [address]    Report Status PENDING  Incomplete  Blood culture (routine x 2)     Status: None (Preliminary result)   Collection Time: 09/10/19 12:28 AM   Specimen: BLOOD  Result Value  Ref Range Status   Specimen Description BLOOD RIGHT ANTECUBITAL  Final   Special Requests   Final    BOTTLES DRAWN AEROBIC AND ANAEROBIC Blood Culture adequate volume   Culture  Final    NO GROWTH 1 DAY Performed at [facility], [address] [place], [place] [address]    Report Status PENDING  Incomplete  Urine Culture     Status: Abnormal   Collection Time: 09/10/19  1:29 AM   Specimen: Urine, Random  Result Value Ref Range Status   Specimen Description   Final    URINE, RANDOM Performed at [facility], [address] [place], [place] [address]    Special Requests   Final    NONE Performed at [facility], [address] [place],  [address]    Culture MULTIPLE SPECIES PRESENT, SUGGEST RECOLLECTION (A)  Final   Report Status 09/11/2019 FINAL  Final  Respiratory Panel by RT PCR (Flu A&B, Covid) - Nasopharyngeal Swab     Status: None   Collection Time: 09/10/19  1:29 AM   Specimen: Nasopharyngeal Swab  Result Value Ref Range Status   SARS Coronavirus 2 by RT PCR NEGATIVE NEGATIVE Final    Comment: (NOTE) SARS-CoV-2 target nucleic acids are NOT DETECTED. The SARS-CoV-2 RNA is generally detectable in upper respiratoy specimens during the acute phase of infection. The lowest concentration of SARS-CoV-2 viral copies this assay can detect is 131 copies/mL. A negative result does not preclude SARS-Cov-2 infection and should not be used as the sole basis for treatment or other patient management decisions. A negative result may occur with  improper specimen collection/handling, submission of specimen other than nasopharyngeal swab, presence of viral mutation(s) within the areas targeted by this assay, and inadequate number of viral copies (<131 copies/mL). A negative result must be combined with clinical observations, patient history, and epidemiological information. The expected result is Negative. Fact Sheet for Patients:   PinkCheek.be Fact Sheet for Healthcare Providers:  GravelBags.it This test is not yet ap proved or cleared by the [place] FDA and  has been authorized for detection and/or diagnosis of SARS-CoV-2 by FDA under an Emergency Use Authorization (EUA). This EUA will remain  in effect (meaning this test can be used) for the duration of the COVID-19 declaration under Section 564(b)(1) of the Act, 21 U.S.C. section 360bbb-3(b)(1), unless the authorization is terminated or revoked sooner.    Influenza A by PCR NEGATIVE NEGATIVE Final   Influenza B by PCR NEGATIVE NEGATIVE Final    Comment: (NOTE) The Xpert Xpress SARS-CoV-2/FLU/RSV assay is intended as an aid in  the diagnosis of influenza from Nasopharyngeal swab specimens and  should not be used as a sole basis for treatment. Nasal washings and  aspirates are unacceptable for Xpert Xpress SARS-CoV-2/FLU/RSV  testing. Fact Sheet for Patients: PinkCheek.be Fact Sheet for Healthcare Providers: GravelBags.it This test is not yet approved or cleared by the [place] FDA and  has been authorized for detection and/or diagnosis of SARS-CoV-2 by  FDA under an Emergency Use Authorization (EUA). This EUA will remain  in effect (meaning this test can be used) for the duration of the  Covid-19 declaration under Section 564(b)(1) of the Act, 21  U.S.C. section 360bbb-3(b)(1), unless the authorization is  terminated or revoked. Performed at [facility], [address] [place],  [address]          Radiology Studies: CT Head Wo Contrast  Result Date: 09/10/2019 CLINICAL DATA:  Weakness, locking EXAM: CT HEAD WITHOUT CONTRAST TECHNIQUE: Contiguous axial images were obtained from the base of the skull through the vertex without intravenous contrast. COMPARISON:  August 08, 2019 FINDINGS: Brain: No evidence of acute  territorial infarction,  hemorrhage, hydrocephalus,extra-axial collection or mass lesion/mass effect. Normal gray-white differentiation. Ventricles are normal in size and contour. Vascular: No hyperdense vessel or unexpected calcification. Skull: The skull is intact. No fracture or focal lesion identified. Sinuses/Orbits: The visualized paranasal sinuses and mastoid air cells are clear. The orbits and globes intact. Other: None IMPRESSION: No acute intracranial abnormality. Electronically Signed   By: Prudencio Pair M.D.   On: 09/10/2019 00:18   MR CERVICAL SPINE WO CONTRAST  Result Date: 09/10/2019 CLINICAL DATA:  Neuro deficit, acute, stroke suspected. Bilateral lower extremity weakness. Bladder stool contents. Symptoms 4 weeks. End-stage renal disease. Hospitalization approximately 1 month ago. EXAM: MRI CERVICAL SPINE WITHOUT CONTRAST TECHNIQUE: Multiplanar, multisequence MR imaging of the cervical spine was performed. No intravenous contrast was administered. COMPARISON:  CT of the cervical spine 08/13/2019 FINDINGS: Alignment: Significant listhesis is present. There is some straightening of the normal cervical lordosis. Vertebrae: Marrow signal and vertebral body heights are normal. Cord: The study is mildly degraded by patient motion. No abnormal cord signal or morphology is present. Signal changes on the T2 sagittal sequence are not present on the axial or T2 STIR sequence. Posterior Fossa, vertebral arteries, paraspinal tissues: Craniocervical junction is normal. Flow is present in the vertebral arteries bilaterally. Visualized intracranial contents are normal. Disc levels: No significant cervical disc protrusion or stenosis is present. The central canal and foramina are patent. IMPRESSION: 1. No acute or focal lesion to explain the patient's symptoms. 2. Straightening of the normal cervical lordosis is nonspecific, but can be seen in the setting of muscle strain or ongoing pain. Electronically Signed   By:  San Morelle M.D.   On: 09/10/2019 06:50   DG Chest Portable 1 View  Result Date: 09/10/2019 CLINICAL DATA:  Weakness and shortness of breath EXAM: PORTABLE CHEST 1 VIEW COMPARISON:  08/08/2019 FINDINGS: Cardiac shadow is stable. Dialysis catheter is now seen in satisfactory position. Pleural effusions are noted right greater than left with central vascular congestion. This may be related to a degree of volume overload. Bibasilar atelectasis is seen. Patchy opacities are noted which may be related to atypical pneumonia. Clinical correlation is recommended. IMPRESSION: Vascular congestion and effusions likely related to volume overload. This is new from the prior exam. Patchy opacities are noted in both lungs suspicious for atypical pneumonia. Correlation with COVID-19 testing is recommended. Electronically Signed   By: Inez Catalina M.D.   On: 09/10/2019 00:28        Scheduled Meds: . amLODipine  5 mg Oral Daily  . carvedilol  3.125 mg Oral BID WC  . Chlorhexidine Gluconate Cloth  6 each Topical Q0600  . gabapentin  300 mg Oral BID  . heparin  5,000 Units Subcutaneous Q8H  . insulin aspart  0-5 Units Subcutaneous QHS  . insulin aspart  0-6 Units Subcutaneous TID WC  . levothyroxine  25 mcg Oral QAC breakfast  . multivitamin  1 tablet Oral QHS  . pantoprazole  40 mg Oral Daily  . pravastatin  40 mg Oral q1800  . sertraline  25 mg Oral Daily   Continuous Infusions:   LOS: 1 day    Time spent: 40 minutes    Irine Seal, MD [facility]   To contact the attending provider between 7A-7P or the covering provider during after hours 7P-7A, please log into the web site [email or web address] and access using universal [place] password for that web site. If you do not have the password, please call the hospital operator.  09/11/2019,  11:49 AM

## 2019-09-11 NOTE — Evaluation (Signed)
Occupational Therapy Evaluation Patient Details Name: Melanie Hall MRN: [id number] DOB: 17-Aug-1970 Today's Date: 09/11/2019    History of Present Illness 49 year old female with longstanding history of diabetes mellitus, end-stage renal disease on hemodialysis, hypertension presented to [facility] emergency department with 4 weeks history of progressive bilateral lower extremity weakness, numbness, bowel and bladder incontinence.   Clinical Impression   Pt admitted with the above diagnoses and presents with below problem list. Pt will benefit from continued acute OT to address the below listed deficits and maximize independence with basic ADLs prior to d/c to venue below. PTA, pt has been operating essentially at bed level, daughter assists at home though it does not appear she currently has 24/7 care. Pt does present with continued self-limiting behaviors. Pt incontinent of bowel in bed upon arrival of OT, total A for pericare tasks at bed level with pt rolling to each side at mod I level. Pt agreeable, with mod encouragement, to stand briefly EOB this session. She needed mod A to stand from EOB, utilized rw. Pt reporting increased back pain once EOB. She is currently mod A +2 for LB ADLs and likely +2 at least for chair follow to attempt any further mobility. Suspect her functional level varies depending in large part on volition.       Follow Up Recommendations  SNF    Equipment Recommendations  Other (comment)(defer to next venue)    Recommendations for Other Services       Precautions / Restrictions Precautions Precautions: Fall Precaution Comments: h/o agitation during previous admissions Restrictions Weight Bearing Restrictions: No      Mobility Bed Mobility Overal bed mobility: Needs Assistance Bed Mobility: Sit to Sidelying;Rolling;Sidelying to Sit Rolling: Modified independent (Device/Increase time) Sidelying to sit: Min assist     Sit to sidelying: Min assist General  bed mobility comments: assist provided with onset of back pain during positional changes  Transfers Overall transfer level: Needs assistance Equipment used: Rolling walker (2 wheeled)   Sit to Stand: Mod assist         General transfer comment: to/from EOB. mod A to powerup. Pt reporting increased back pain once EOB. Mod A to steady back into sitting position    Balance Overall balance assessment: Needs assistance Sitting-balance support: Bilateral upper extremity supported;Feet supported Sitting balance-Leahy Scale: Fair Sitting balance - Comments: increased back pain sitting EOB   Standing balance support: Bilateral upper extremity supported Standing balance-Leahy Scale: Poor Standing balance comment: reliant on BUE support                           ADL either performed or assessed with clinical judgement   ADL Overall ADL's : Needs assistance/impaired Eating/Feeding: Set up;Sitting   Grooming: Wash/dry hands;Sitting;Set up Grooming Details (indicate cue type and reason): supported seating this session due to back pain sitting EOB Upper Body Bathing: Set up;Sitting;Minimal assistance Upper Body Bathing Details (indicate cue type and reason): BUE support in sitting due to back pain Lower Body Bathing: Minimal assistance;+2 for safety/equipment;Sit to/from stand   Upper Body Dressing : Set up;Minimal assistance;Sitting Upper Body Dressing Details (indicate cue type and reason): BUE support in sitting due to back pain Lower Body Dressing: Moderate assistance;+2 for safety/equipment;Sit to/from stand                 General ADL Comments: Bed mobility for pericare as pt incontinent of bowel upon arrival of OT. Pt then sat EOB briefly  before standing EOB with walker for < 30 seconds. Pt reporting increased back pain once sitting EOB     Vision         Perception     Praxis      Pertinent Vitals/Pain Pain Assessment: Faces Faces Pain Scale: Hurts whole  lot Pain Location: back with EOB, appeared to lessen to 6/10  Pain Descriptors / Indicators: Crying;Grimacing Pain Intervention(s): Monitored during session;Repositioned     Hand Dominance Right   Extremity/Trunk Assessment Upper Extremity Assessment Upper Extremity Assessment: Generalized weakness   Lower Extremity Assessment Lower Extremity Assessment: Defer to PT evaluation       Communication Communication Communication: No difficulties   Cognition Arousal/Alertness: Awake/alert Behavior During Therapy: Flat affect;Anxious Overall Cognitive Status: No family/caregiver present to determine baseline cognitive functioning                                 General Comments: internally distracted. self-limiting but agreeable to standing EOB with mod encouragement. Decreased insight into deficits   General Comments       Exercises     Shoulder Instructions      Home Living Family/patient expects to be discharged to:: Private residence Living Arrangements: Non-relatives/Friends Available Help at Discharge: Family;Available PRN/intermittently Type of Home: House Home Access: Stairs to enter [employer] of Steps: 1 threshold Entrance Stairs-Rails: None Home Layout: One level     Bathroom Shower/Tub: Teacher, early years/pre: Standard     Home Equipment: Museum/gallery conservator - 2 wheels;Tub bench   Additional Comments: Lives with 2 daughters (37 y.o. and 24 y.o. I believe) who can assist as needed. Also reports friends live at the residence.      Prior Functioning/Environment Level of Independence: Needs assistance  Gait / Transfers Assistance Needed: minimal OOB activity, rw when OOB ADL's / Homemaking Assistance Needed: daughter assist with ADLs, voids at bed level. self-limiting.            OT Problem List: Decreased strength;Impaired balance (sitting and/or standing);Decreased activity tolerance;Cardiopulmonary  status limiting activity      OT Treatment/Interventions: Self-care/ADL training;Energy conservation;DME and/or AE instruction;Patient/family education;Balance training    OT Goals(Current goals can be found in the care plan section) Acute Rehab OT Goals Patient Stated Goal: rest OT Goal Formulation: With patient Time For Goal Achievement: 09/25/19 Potential to Achieve Goals: Good ADL Goals Pt Will Perform Grooming: with supervision;with set-up;sitting Pt Will Perform Upper Body Bathing: with min guard assist;sitting Pt Will Perform Lower Body Bathing: with min assist;sit to/from stand Pt Will Transfer to Toilet: with min assist;stand pivot transfer;ambulating;bedside commode Pt Will Perform Toileting - Clothing Manipulation and hygiene: with min assist;sit to/from stand Pt/caregiver will Perform Home Exercise Program: Increased strength;Both right and left upper extremity;With Supervision  OT Frequency: Min 2X/week   Barriers to D/C: Decreased caregiver support  needs 24/7 assist       Co-evaluation PT/OT/SLP Co-Evaluation/Treatment: Yes Reason for Co-Treatment: Necessary to address cognition/behavior during functional activity;For patient/therapist safety   OT goals addressed during session: ADL's and self-care      AM-PAC OT "6 Clicks" Daily Activity     Outcome Measure Help from another person eating meals?: None Help from another person taking care of personal grooming?: A Little Help from another person toileting, which includes using toliet, bedpan, or urinal?: A Lot Help from another person bathing (including washing, rinsing, drying)?: A Lot Help from another  person to put on and taking off regular upper body clothing?: A Little Help from another person to put on and taking off regular lower body clothing?: A Lot 6 Click Score: 16   End of Session Equipment Utilized During Treatment: Rolling walker Nurse Communication: Mobility status  Activity Tolerance: Patient  limited by pain Patient left: in bed;with call bell/phone within reach;with bed alarm set  OT Visit Diagnosis: Unsteadiness on feet (R26.81);Other abnormalities of gait and mobility (R26.89);Muscle weakness (generalized) (M62.81)                Time: 4888-9169 OT Time Calculation (min): 24 min Charges:  OT General Charges $OT Visit: 1 Visit OT Evaluation $OT Eval Moderate Complexity: [place], OT Acute Rehabilitation Services Pager: [phone] Office: [phone]   Hortencia Pilar 09/11/2019, 11:06 AM

## 2019-09-11 NOTE — Plan of Care (Signed)
  Problem: Clinical Measurements: Goal: Ability to maintain clinical measurements within normal limits will improve Outcome: Progressing   

## 2019-09-11 NOTE — Evaluation (Signed)
Physical Therapy Evaluation Patient Details Name: Melanie Hall MRN: [id number] DOB: July 27, 1970 Today's Date: 09/11/2019   History of Present Illness  49 year old female with longstanding history of diabetes mellitus, end-stage renal disease on hemodialysis, hypertension presented to [facility] emergency department with 4 weeks history of progressive bilateral lower extremity weakness, numbness, bowel and bladder incontinence.  Clinical Impression  Pt was seen for mobility with transfers and bed mob during session.  Her pain level was significantly different as soon as she sat up on side of bed, with more assistance needed to recover sitting balance control.  Her plan is to get up and walk with PT as soon as possible, to recover with her control of sitting and standing balance and increase standing endurance.  Her family per chart are hoping for her to go to rehab and are asking for this from PT as well.  Pt is not committing to this plan yet, but would be a higher fall risk to go directly home for now.  See acutely 3x per week since pt is likely to refuse this option.      Follow Up Recommendations SNF    Equipment Recommendations  None recommended by PT    Recommendations for Other Services       Precautions / Restrictions Precautions Precautions: Fall Precaution Comments: agitation and compliance issues previously Restrictions Weight Bearing Restrictions: No      Mobility  Bed Mobility Overal bed mobility: Needs Assistance Bed Mobility: Sidelying to Sit;Sit to Sidelying;Rolling Rolling: Modified independent (Device/Increase time) Sidelying to sit: Min guard;Min assist     Sit to sidelying: Min assist General bed mobility comments: assist provided with onset of back pain during positional changes  Transfers Overall transfer level: Needs assistance Equipment used: Rolling walker (2 wheeled) Transfers: Sit to/from Stand Sit to Stand: Mod assist         General transfer  comment: mod assist to stand up and min mod to return to sitting  Ambulation/Gait             General Gait Details: declined but was standing min guard with RW  Stairs            Wheelchair Mobility    Modified Rankin (Stroke Patients Only)       Balance Overall balance assessment: Needs assistance Sitting-balance support: Bilateral upper extremity supported Sitting balance-Leahy Scale: Fair Sitting balance - Comments: increased back pain sitting EOB   Standing balance support: Bilateral upper extremity supported Standing balance-Leahy Scale: Poor Standing balance comment: requires RW but is not noted to have buckling at knees                             Pertinent Vitals/Pain Pain Assessment: Faces Pain Score: 8  Faces Pain Scale: Hurts whole lot Pain Location: EOB sitting pain complaints, states the pain was the same standing Pain Descriptors / Indicators: Grimacing;Guarding Pain Intervention(s): Limited activity within patient's tolerance;Monitored during session;Premedicated before session;Repositioned;Patient requesting pain meds-RN notified    Home Living Family/patient expects to be discharged to:: Private residence Living Arrangements: Non-relatives/Friends Available Help at Discharge: Family;Available PRN/intermittently Type of Home: House Home Access: Stairs to enter Entrance Stairs-Rails: None Entrance Stairs-Number of Steps: 1 threshold Home Layout: One level Home Equipment: Hand held shower head;Walker - 2 wheels;Tub bench Additional Comments: Lives with 2 daughters (75 y.o. and 15 y.o. I believe) who can assist as needed. Also reports friends live at the residence.  Prior Function Level of Independence: Needs assistance   Gait / Transfers Assistance Needed: has been staying in bed recently  ADL's / Homemaking Assistance Needed: daughters assist with her care, had declined to use [facility]  Comments: self limiting     Hand  Dominance   Dominant Hand: Right    Extremity/Trunk Assessment   Upper Extremity Assessment Upper Extremity Assessment: Defer to OT evaluation    Lower Extremity Assessment Lower Extremity Assessment: Generalized weakness    Cervical / Trunk Assessment Cervical / Trunk Assessment: Normal  Communication   Communication: No difficulties  Cognition Arousal/Alertness: Awake/alert Behavior During Therapy: Flat affect;Anxious Overall Cognitive Status: No family/caregiver present to determine baseline cognitive functioning                                 General Comments: pt declines to be in chair but agrees to stand and get back to bed      General Comments General comments (skin integrity, edema, etc.): Pt is demonstrating at least 3/5 mm strength of LE's but with standing also is controlling with RW.  Has pain that is creating issue with assistance but is not unable to move    Exercises     Assessment/Plan    PT Assessment Patient needs continued PT services  PT Problem List Decreased strength;Decreased activity tolerance;Decreased balance;Decreased mobility;Decreased knowledge of use of DME;Decreased safety awareness;Pain;Decreased range of motion       PT Treatment Interventions DME instruction;Gait training;Functional mobility training;Stair training;Therapeutic activities;Therapeutic exercise;Balance training;Neuromuscular re-education;Patient/family education    PT Goals (Current goals can be found in the Care Plan section)  Acute Rehab PT Goals Patient Stated Goal: to manage her pain PT Goal Formulation: With patient Time For Goal Achievement: 09/25/19 Potential to Achieve Goals: Good    Frequency Min 3X/week(in case she refuses SNF)   Barriers to discharge Inaccessible home environment;Decreased caregiver support has one person assist at times and a step to enter    Co-evaluation PT/OT/SLP Co-Evaluation/Treatment: Yes Reason for Co-Treatment:  For patient/therapist safety;To address functional/ADL transfers;Necessary to address cognition/behavior during functional activity PT goals addressed during session: Mobility/safety with mobility OT goals addressed during session: ADL's and self-care       AM-PAC PT "6 Clicks" Mobility  Outcome Measure Help needed turning from your back to your side while in a flat bed without using bedrails?: None Help needed moving from lying on your back to sitting on the side of a flat bed without using bedrails?: A Little Help needed moving to and from a bed to a chair (including a wheelchair)?: A Little Help needed standing up from a chair using your arms (e.g., wheelchair or bedside chair)?: A Lot Help needed to walk in hospital room?: Total Help needed climbing 3-5 steps with a railing? : Total 6 Click Score: 14    End of Session Equipment Utilized During Treatment: Gait belt Activity Tolerance: Patient limited by fatigue;Treatment limited secondary to medical complications (Comment);Patient limited by pain Patient left: in bed;with call bell/phone within reach;with bed alarm set Nurse Communication: Mobility status PT Visit Diagnosis: Unsteadiness on feet (R26.81);Muscle weakness (generalized) (M62.81)    Time: 0935-1000 PT Time Calculation (min) (ACUTE ONLY): 25 min   Charges:   PT Evaluation $PT Eval Moderate Complexity: 1 Mod         Ramond Dial 09/11/2019, 12:46 PM  Mee Hives, PT MS Acute Rehab Dept. Number: [place]

## 2019-09-11 NOTE — Progress Notes (Signed)
MRI C and T spine pending

## 2019-09-12 LAB — IRON AND TIBC
Iron: 31 ug/dL (ref 28–170)
Saturation Ratios: 13 % (ref 10.4–31.8)
TIBC: 235 ug/dL — ABNORMAL LOW (ref 250–450)
UIBC: 204 ug/dL

## 2019-09-12 LAB — CBC WITH DIFFERENTIAL/PLATELET
Abs Immature Granulocytes: 0.02 10*3/uL (ref 0.00–0.07)
Basophils Absolute: 0.1 10*3/uL (ref 0.0–0.1)
Basophils Relative: 1 %
Eosinophils Absolute: 0.2 10*3/uL (ref 0.0–0.5)
Eosinophils Relative: 4 %
HCT: 28.7 % — ABNORMAL LOW (ref 36.0–46.0)
Hemoglobin: 8.8 g/dL — ABNORMAL LOW (ref 12.0–15.0)
Immature Granulocytes: 0 %
Lymphocytes Relative: 24 %
Lymphs Abs: 1.5 10*3/uL (ref 0.7–4.0)
MCH: 30.2 pg (ref 26.0–34.0)
MCHC: 30.7 g/dL (ref 30.0–36.0)
MCV: 98.6 fL (ref 80.0–100.0)
Monocytes Absolute: 0.7 10*3/uL (ref 0.1–1.0)
Monocytes Relative: 11 %
Neutro Abs: 3.8 10*3/uL (ref 1.7–7.7)
Neutrophils Relative %: 60 %
Platelets: 153 10*3/uL (ref 150–400)
RBC: 2.91 MIL/uL — ABNORMAL LOW (ref 3.87–5.11)
RDW: 16.1 % — ABNORMAL HIGH (ref 11.5–15.5)
WBC: 6.4 10*3/uL (ref 4.0–10.5)
nRBC: 0 % (ref 0.0–0.2)

## 2019-09-12 LAB — RENAL FUNCTION PANEL
Albumin: 2.5 g/dL — ABNORMAL LOW (ref 3.5–5.0)
Anion gap: 11 (ref 5–15)
BUN: 10 mg/dL (ref 6–20)
CO2: 27 mmol/L (ref 22–32)
Calcium: 8.2 mg/dL — ABNORMAL LOW (ref 8.9–10.3)
Chloride: 100 mmol/L (ref 98–111)
Creatinine, Ser: 2.58 mg/dL — ABNORMAL HIGH (ref 0.44–1.00)
GFR calc Af Amer: 24 mL/min — ABNORMAL LOW (ref >60)
GFR calc non Af Amer: 21 mL/min — ABNORMAL LOW (ref >60)
Glucose, Bld: 123 mg/dL — ABNORMAL HIGH (ref 70–99)
Phosphorus: 2.9 mg/dL (ref 2.5–4.6)
Potassium: 3.7 mmol/L (ref 3.5–5.1)
Sodium: 138 mmol/L (ref 135–145)

## 2019-09-12 LAB — GLUCOSE, CAPILLARY
Glucose-Capillary: 94 mg/dL (ref 70–99)
Glucose-Capillary: 98 mg/dL (ref 70–99)
Glucose-Capillary: 118 mg/dL — ABNORMAL HIGH (ref 70–99)
Glucose-Capillary: 211 mg/dL — ABNORMAL HIGH (ref 70–99)
Glucose-Capillary: 151 mg/dL — ABNORMAL HIGH (ref 70–99)

## 2019-09-12 LAB — FERRITIN: Ferritin: 289 ng/mL (ref 11–307)

## 2019-09-12 MED ORDER — CARVEDILOL 6.25 MG PO TABS
6.2500 mg | ORAL_TABLET | Freq: Two times a day (BID) | ORAL | Status: DC
  Administered 2019-09-12 – 2019-09-19 (×12): 6.25 mg via ORAL
  Filled 2019-09-12 (×12): qty 1

## 2019-09-12 MED ORDER — CARVEDILOL 3.125 MG PO TABS
3.1250 mg | ORAL_TABLET | Freq: Once | ORAL | Status: AC
  Administered 2019-09-12: 3.125 mg via ORAL
  Filled 2019-09-12: qty 1

## 2019-09-12 NOTE — Progress Notes (Signed)
Late entry:  Patient c/o not being able to urinate. Bladder scan revealed 376 mLs of urine. MD made aware. Verbal orders to do in and out cath. In and out completed, 400 cc's of cloudy amber colored urine.

## 2019-09-12 NOTE — Progress Notes (Signed)
PROGRESS NOTE    Melanie Hall  CMK:[id number] DOB: 12/19/1970 DOA: 09/09/2019 PCP: [facility] [place]    Brief Narrative:  HPI per Dr. Timmothy Euler Melanie Hall is a 49 y.o. female with medical history significant of HTN; DM; and ESRD recently started on HD presenting with weakness.  She is not able to walk.  This has been going on for 4 weeks.  She is not certain why this is happened.  She started HD 4 weeks ago and the inability to walk started at the same time.  Her R arm is also weak.  She is having numbness and tingling of her arms and legs.  +urinary and fecal incontinence for the same length of time.  Nothing changed yesterday to make her come to the ER.  She has a mild headache.  No visual disturbance.  No dysphagia.  The patient appeared frustrated and reluctant to provide history.  I spoke with her daughter, who brought her to the ER.  She has been been out of the hospital for 2-3 weeks.  During the time she was hospitalized, she only got up once to walk and has been unable to walk since.  [facility] has been helping at home, and they raised concerns about her inability to walk.  She is not taking medications, no insulin since discharge.  Her daughter is taking her to HD qMWF and otherwise only wants fast food, doesn't want to eat right.  She has a couple of bedsores.  Her daughter this is physical and psychological.  She had prior h/o leg surgery and refused therapy and mostly stopped moving around and her health declined accordingly.  She walked with a walker until a couple of months ago, but never really walked that much after her leg surgery.  Yesterday, while her daughter talked to the triage nurse, her mother stood with the triage nurse but she won't do that at home.  She voids/stools at home and will not attempt to get out of bed to use the bedside commode.  Her daughter thinks she needs SNF rehab, but the patient has refused that.   They are thinking APS may need to be involved.  Her  sister moved in to her house 2 days ago and her mother didn't like it that she was making her move too much.  There is someone bringing her fast food and her mother refuses to eat healthy foods. At this point, "we tried it, we tried to let her be at home..." but it isn't working.  Strong FH of mental health disorders; her 2nd husband was killed; daughter agrees with psychiatry evaluation.   ED Course: Has UTI and needs MRI of spine.  Showed up at [facility], concern for cauda equina, unable to get MRI effectively.  Likely needs anesthesia for MRI.  Acute vs. Chronic weakness, urinary incontinence.  Teleneuro recommended MRI while at [facility].   Assessment & Plan:   Principal Problem:   Debility Active Problems:   Type 2 diabetes with nephropathy ([place])   HYPERCHOLESTEROLEMIA   Hypertension associated with diabetes ([place])   ESRD (end stage renal disease) on dialysis ([place])   Depression   Incontinence in female   Pressure ulcer   Acute cystitis without hematuria   Weakness   Weakness of both lower extremities  1 debility with fecal/urinary incontinence Questionable etiology.  Concern for cauda equina syndrome. Patient noted to have remote history of leg surgery(left femur fracture April 2020) and since then has not been  very mobile at that time.  Patient noted to have been recently hospitalized from January 30-February 16 and started on hemodialysis and since then and really getting out of bed.  Patient states feels extremely weak after hemodialysis sessions.  Per admitting physician patient noted to have absent reflexes felt secondary to diabetic polyneuropathy.  Patient with bowel and bladder incontinence concerning for cauda equina syndrome however.  Admitting MD it was felt patient's incontinence could also be more associated with patient being unable/unwilling to get out of bed for toileting.  MRI C-spine with no acute abnormalities noted.  It was noted patient unable to tolerate MRI even with  anxiolytics and as such further MRI necessary may need to be under general anesthesia.  RBC folate at 1312, CRP at 5, vitamin B12 at 818, lactic acid level at 0.8, sed rate at 45, TSH at 3.242.  MRI T-spine and L-spine pending.  Neurontin increased to 300 mg twice daily per neurology recommendations.  PT/OT.  Per neurology if MRI negative will need outpatient EMG/nerve conduction study with neurology. Patient seen in consultation by neurology who are following.  2.  End-stage renal disease MWF Patient recently started on hemodialysis at the [facility] Monday Wednesday Friday.  Patient noted to have had a full treatment on Wednesday.  Patient seen in consultation by nephrology and patient underwent hemodialysis on 09/11/2019.  Per nephrology.   3.  Anemia of chronic disease Anemia panel from 08/09/2019 with iron of 22, TIBC of 203, transferrin of 145, folate of 1440.  Hemoglobin currently 8.8 this morning.  Per nephrology.  4.  Depression Concerned that patient's symptoms may have a psychogenic component in place.  Psychiatry consultation obtained and was felt patient does not meet inpatient criteria and recommended social work follow-up with daughter due to voice concerns.  Patient started on Zoloft per psych recommendations.  5.  Hypothyroidism Continue home dose Synthroid.  6.  Hypertension Continue current regimen of Norvasc and Coreg.  Patient on hemodialysis.  7.  Hyperlipidemia Statin.   8.  Diabetes mellitus type 2 Hemoglobin A1c of 7.2.  CBG of 94 this morning.  Continue sliding scale insulin.   DVT prophylaxis: Heparin Code Status: Full Family Communication: Updated patient.  No family at bedside. Disposition Plan:  . Patient came from: Home            . Anticipated d/c place: Likely skilled nursing facility . Barriers to d/c OR conditions which need to be met to effect a safe d/c: Likely skilled nursing facility once cleared by neurology, nephrology and evaluation  by PT/OT with MRI resulted.   Consultants:   Telemetry neurology: Dr. Roanna Raider 09/10/2019  Neuro hospitalist: Dr. Lorraine Lax 09/10/2019  Psychiatry: Dr. Dwyane Dee 09/11/2019  Nephrology: Dr. Augustin Coupe 09/10/2019  Procedures:   MRI T-spine pending  CT head 09/10/2019  Chest x-ray 09/10/2019  MRI C-spine 09/10/2019  Antimicrobials:   IV Rocephin 09/10/2019>>> 09/11/2019   Subjective: Patient alert following commands.  Denies any chest pain or shortness of breath.  Concerned someone states he may have had a pneumonia on chest x-ray.  Not sure what is she has had any bowel or bladder incontinence however on exam the patient noted to have stool in the bed.   Objective: Vitals:   09/11/19 1857 09/11/19 2017 09/11/19 2205 09/12/19 0512  BP: (!) 193/92 (!) 194/87 (!) 192/86 (!) 183/83  Pulse: 91 91 91 87  Resp: (!) '22 18 16 16  '$ Temp: 98.7 F (37.1 C) 98.8 F (37.1  C) 99.2 F (37.3 C) 98.2 F (36.8 C)  TempSrc: Oral Oral Oral Oral  SpO2: 100% 97% 97% 99%  Weight: 74.2 kg  73.4 kg   Height:        Intake/Output Summary (Last 24 hours) at 09/12/2019 1123 Last data filed at 09/12/2019 0500 Gross per 24 hour  Intake 120 ml  Output 2000 ml  Net -1880 ml   Filed Weights   09/11/19 1515 09/11/19 1857 09/11/19 2205  Weight: 76.2 kg 74.2 kg 73.4 kg    Examination:  General exam: NAD Respiratory system: Crackles.  No wheezes.  No rhonchi.  Normal respiratory effort.  Cardiovascular system: Regular rate rhythm no murmurs rubs or gallops.  No JVD.  No lower extremity edema.  Gastrointestinal system: Abdomen is soft, nontender, nondistended, positive bowel sounds.  No rebound.  No guarding.  Central nervous system: Alert and oriented. No focal neurological deficits.  Moving extremities spontaneously. Extremities: Symmetric 5 x 5 power. Skin: No rashes, lesions or ulcers Psychiatry: Judgement and insight appear normal. Mood & affect appropriate.     Data Reviewed: I have personally reviewed following labs  and imaging studies  CBC: Recent Labs  Lab 09/10/19 0021 09/10/19 1820 09/11/19 0626 09/12/19 0349  WBC 9.4  --  6.7 6.4  NEUTROABS 6.7  --   --  3.8  HGB 10.0*  --  9.7* 8.8*  HCT 32.3* 32.1* 32.1* 28.7*  MCV 100.6*  --  101.9* 98.6  PLT 171  --  148* 324   Basic Metabolic Panel: Recent Labs  Lab 09/10/19 0021 09/11/19 0626 09/12/19 0349  NA 135 141 138  K 3.4* 4.1 3.7  CL 99 101 100  CO2 '26 25 27  '$ GLUCOSE 281* 150* 123*  BUN '14 19 10  '$ CREATININE 2.45* 3.62* 2.58*  CALCIUM 8.1* 8.7* 8.2*  PHOS  --  4.0 2.9   GFR: Estimated Creatinine Clearance: 25.3 mL/min (A) (by C-G formula based on SCr of 2.58 mg/dL (H)). Liver Function Tests: Recent Labs  Lab 09/10/19 0021 09/12/19 0349  AST 15  --   ALT 17  --   ALKPHOS 154*  --   BILITOT 1.2  --   PROT 6.6  --   ALBUMIN 2.9* 2.5*   No results for input(s): LIPASE, AMYLASE in the last 168 hours. No results for input(s): AMMONIA in the last 168 hours. Coagulation Profile: No results for input(s): INR, PROTIME in the last 168 hours. Cardiac Enzymes: No results for input(s): CKTOTAL, CKMB, CKMBINDEX, TROPONINI in the last 168 hours. BNP (last 3 results) No results for input(s): PROBNP in the last 8760 hours. HbA1C: Recent Labs    09/10/19 1820  HGBA1C 7.2*   CBG: Recent Labs  Lab 09/11/19 2023 09/11/19 2243 09/12/19 0709 09/12/19 0725 09/12/19 1111  GLUCAP 96 153* 98 94 118*   Lipid Profile: No results for input(s): CHOL, HDL, LDLCALC, TRIG, CHOLHDL, LDLDIRECT in the last 72 hours. Thyroid Function Tests: Recent Labs    09/10/19 1544  TSH 3.242   Anemia Panel: Recent Labs    09/10/19 1544 09/12/19 0349  VITAMINB12 818  --   FERRITIN  --  289  TIBC  --  235*  IRON  --  31   Sepsis Labs: Recent Labs  Lab 09/10/19 0021  LATICACIDVEN 0.8    Recent Results (from the past 240 hour(s))  Blood culture (routine x 2)     Status: None (Preliminary result)   Collection Time: 09/10/19 12:22 AM  Specimen: BLOOD  Result Value Ref Range Status   Specimen Description BLOOD BLOOD RIGHT WRIST  Final   Special Requests   Final    BOTTLES DRAWN AEROBIC AND ANAEROBIC Blood Culture adequate volume   Culture   Final    NO GROWTH 2 DAYS Performed at [facility], [address], [place], [place] [address]    Report Status PENDING  Incomplete  Blood culture (routine x 2)     Status: None (Preliminary result)   Collection Time: 09/10/19 12:28 AM   Specimen: BLOOD  Result Value Ref Range Status   Specimen Description BLOOD RIGHT ANTECUBITAL  Final   Special Requests   Final    BOTTLES DRAWN AEROBIC AND ANAEROBIC Blood Culture adequate volume   Culture   Final    NO GROWTH 2 DAYS Performed at [facility], [address], [place], [place] [address]    Report Status PENDING  Incomplete  Urine Culture     Status: Abnormal   Collection Time: 09/10/19  1:29 AM   Specimen: Urine, Random  Result Value Ref Range Status   Specimen Description   Final    URINE, RANDOM Performed at [facility], [address], [place], [place] [address]    Special Requests   Final    NONE Performed at [facility], [address] [place], [place] [address]    Culture MULTIPLE SPECIES PRESENT, SUGGEST RECOLLECTION (A)  Final   Report Status 09/11/2019 FINAL  Final  Respiratory Panel by RT PCR (Flu A&B, Covid) - Nasopharyngeal Swab     Status: None   Collection Time: 09/10/19  1:29 AM   Specimen: Nasopharyngeal Swab  Result Value Ref Range Status   SARS Coronavirus 2 by RT PCR NEGATIVE NEGATIVE Final    Comment: (NOTE) SARS-CoV-2 target nucleic acids are NOT DETECTED. The SARS-CoV-2 RNA is generally detectable in upper respiratoy specimens during the acute phase of infection. The lowest concentration of SARS-CoV-2 viral copies this assay can detect is 131 copies/mL. A negative result does not preclude SARS-Cov-2 infection and should not be used as the sole basis for treatment or other patient management  decisions. A negative result may occur with  improper specimen collection/handling, submission of specimen other than nasopharyngeal swab, presence of viral mutation(s) within the areas targeted by this assay, and inadequate number of viral copies (<131 copies/mL). A negative result must be combined with clinical observations, patient history, and epidemiological information. The expected result is Negative. Fact Sheet for Patients:  PinkCheek.be Fact Sheet for Healthcare Providers:  GravelBags.it This test is not yet ap proved or cleared by the [place] FDA and  has been authorized for detection and/or diagnosis of SARS-CoV-2 by FDA under an Emergency Use Authorization (EUA). This EUA will remain  in effect (meaning this test can be used) for the duration of the COVID-19 declaration under Section 564(b)(1) of the Act, 21 U.S.C. section 360bbb-3(b)(1), unless the authorization is terminated or revoked sooner.    Influenza A by PCR NEGATIVE NEGATIVE Final   Influenza B by PCR NEGATIVE NEGATIVE Final    Comment: (NOTE) The Xpert Xpress SARS-CoV-2/FLU/RSV assay is intended as an aid in  the diagnosis of influenza from Nasopharyngeal swab specimens and  should not be used as a sole basis for treatment. Nasal washings and  aspirates are unacceptable for Xpert Xpress SARS-CoV-2/FLU/RSV  testing. Fact Sheet for Patients: PinkCheek.be Fact Sheet for Healthcare Providers: GravelBags.it This test is not yet approved or cleared by the [place]  FDA and  has been authorized for detection and/or diagnosis of SARS-CoV-2 by  FDA under an Emergency Use Authorization (EUA). This EUA will remain  in effect (meaning this test can be used) for the duration of the  Covid-19 declaration under Section 564(b)(1) of the Act, 21  U.S.C. section 360bbb-3(b)(1), unless the authorization  is  terminated or revoked. Performed at [facility], [address] [place], [place] [address]          Radiology Studies: No results found.      Scheduled Meds: . amLODipine  5 mg Oral Daily  . carvedilol  3.125 mg Oral Once  . carvedilol  6.25 mg Oral BID WC  . Chlorhexidine Gluconate Cloth  6 each Topical Q0600  . gabapentin  300 mg Oral BID  . heparin  5,000 Units Subcutaneous Q8H  . insulin aspart  0-5 Units Subcutaneous QHS  . insulin aspart  0-6 Units Subcutaneous TID WC  . levothyroxine  25 mcg Oral QAC breakfast  . multivitamin  1 tablet Oral QHS  . pantoprazole  40 mg Oral Daily  . pravastatin  40 mg Oral q1800  . sertraline  25 mg Oral Daily   Continuous Infusions: . sodium chloride    . sodium chloride       LOS: 2 days    Time spent: 40 minutes    Irine Seal, MD [facility]   To contact the attending provider between 7A-7P or the covering provider during after hours 7P-7A, please log into the web site [email or web address] and access using universal [place] password for that web site. If you do not have the password, please call the hospital operator.  09/12/2019, 11:23 AM

## 2019-09-12 NOTE — Progress Notes (Signed)
[facility] Progress Note   49 y.o.femaleDM with polyneuropathy, ESRD MWF [facility]  initiated HD late Feb 2021 here with leg weakness, bladder and bowel incontinence for 4 weeks. She also has saddle anesthesia and uses a walker but did sustain a left leg fracture 10/2018. She also has numbness in both legs. She denies f/c/n/v; also denies CP/ dyspnea/ cough/ myalgias. CT head negative for acute abnormalities; MRI cervical spine without contrast showed no acute or focal lesion to explain the patient's symptomswith nonspecific finding of straightening of the normal cervical lordosis.  Assessment/ Plan:   1. ESRD - recent start on dialysis at [facility] MWF last dialysis was a full treatment on Wednesday.  - Last HD Fri -> resume HD Monday on MWF regimen. 2. Renal osteodystrophy - phos 2.9 -> no need for binder 3. Hypertension - increase carvedilol to 6.25 BID, will try to challenge EDW on Mon. Hydralazine PRN + incr norvasc to 10mg  daily if needed. 4. Anemia - transfuse as needed -> @ goal; will also check iron studies and give ESA.  5. Leg weakness and incontinence r/o cord compression and caudal equina syndrome - w/u per primary.   Subjective:   Weakness in legs but denies f/cn/v.   Objective:   BP (!) 183/83 (BP Location: Right Arm)   Pulse 87   Temp 98.2 F (36.8 C) (Oral)   Resp 16   Ht 5\' 3"  (1.6 m)   Wt 73.4 kg   LMP 12/17/2016   SpO2 99%   BMI 28.66 kg/m   Intake/Output Summary (Last 24 hours) at 09/12/2019 0941 Last data filed at 09/12/2019 0500 Gross per 24 hour  Intake 120 ml  Output 2000 ml  Net -1880 ml   Weight change: -0.1 kg  Physical Exam: General appearance:alert, cooperative and appears stated age Head:Normocephalic, without obvious abnormality, atraumatic Eyes:negative Back:symmetric, no curvature. ROM normal. No CVA tenderness. Resp:clear to auscultation bilaterally Cardio:S1, S2 normal ZO:XWRU, non-tender; bowel  sounds normal; no masses, no organomegaly Extremities:[place] Pulses:2+ and symmetric Access: RIJ TC, LUA BCF (bruit but needs work)  Imaging: No results found.  Labs: BMET Recent Labs  Lab 09/10/19 0021 09/11/19 0626 09/12/19 0349  NA 135 141 138  K 3.4* 4.1 3.7  CL 99 101 100  CO2 26 25 27   GLUCOSE 281* 150* 123*  BUN 14 19 10   CREATININE 2.45* 3.62* 2.58*  CALCIUM 8.1* 8.7* 8.2*  PHOS  --  4.0 2.9   CBC Recent Labs  Lab 09/10/19 0021 09/10/19 1820 09/11/19 0626 09/12/19 0349  WBC 9.4  --  6.7 6.4  NEUTROABS 6.7  --   --  3.8  HGB 10.0*  --  9.7* 8.8*  HCT 32.3* 32.1* 32.1* 28.7*  MCV 100.6*  --  101.9* 98.6  PLT 171  --  148* 153    Medications:    . amLODipine  5 mg Oral Daily  . carvedilol  3.125 mg Oral BID WC  . Chlorhexidine Gluconate Cloth  6 each Topical Q0600  . gabapentin  300 mg Oral BID  . heparin  5,000 Units Subcutaneous Q8H  . insulin aspart  0-5 Units Subcutaneous QHS  . insulin aspart  0-6 Units Subcutaneous TID WC  . levothyroxine  25 mcg Oral QAC breakfast  . multivitamin  1 tablet Oral QHS  . pantoprazole  40 mg Oral Daily  . pravastatin  40 mg Oral q1800  . sertraline  25 mg Oral Daily      Jeneen Rinks  Augustin Coupe, MD 09/12/2019, 9:41 AM

## 2019-09-13 LAB — RENAL FUNCTION PANEL
Albumin: 2.6 g/dL — ABNORMAL LOW (ref 3.5–5.0)
Anion gap: 11 (ref 5–15)
BUN: 19 mg/dL (ref 6–20)
CO2: 27 mmol/L (ref 22–32)
Calcium: 8.3 mg/dL — ABNORMAL LOW (ref 8.9–10.3)
Chloride: 101 mmol/L (ref 98–111)
Creatinine, Ser: 3.46 mg/dL — ABNORMAL HIGH (ref 0.44–1.00)
GFR calc Af Amer: 17 mL/min — ABNORMAL LOW (ref >60)
GFR calc non Af Amer: 15 mL/min — ABNORMAL LOW (ref >60)
Glucose, Bld: 129 mg/dL — ABNORMAL HIGH (ref 70–99)
Phosphorus: 3.8 mg/dL (ref 2.5–4.6)
Potassium: 4.5 mmol/L (ref 3.5–5.1)
Sodium: 139 mmol/L (ref 135–145)

## 2019-09-13 LAB — GLUCOSE, CAPILLARY
Glucose-Capillary: 117 mg/dL — ABNORMAL HIGH (ref 70–99)
Glucose-Capillary: 160 mg/dL — ABNORMAL HIGH (ref 70–99)
Glucose-Capillary: 151 mg/dL — ABNORMAL HIGH (ref 70–99)

## 2019-09-13 LAB — HEMOGLOBIN AND HEMATOCRIT, BLOOD
HCT: 29 % — ABNORMAL LOW (ref 36.0–46.0)
Hemoglobin: 8.9 g/dL — ABNORMAL LOW (ref 12.0–15.0)

## 2019-09-13 MED ORDER — FLUCONAZOLE 150 MG PO TABS
150.0000 mg | ORAL_TABLET | Freq: Once | ORAL | Status: AC
  Administered 2019-09-13: 150 mg via ORAL
  Filled 2019-09-13: qty 1

## 2019-09-13 MED ORDER — CLOTRIMAZOLE 2 % VA CREA
1.0000 | TOPICAL_CREAM | Freq: Every day | VAGINAL | Status: DC
  Administered 2019-09-13 – 2019-09-16 (×4): 1 via VAGINAL
  Filled 2019-09-13 (×2): qty 21

## 2019-09-13 NOTE — Progress Notes (Signed)
Spoke with Dr. Grandville Silos regarding notification of MRI for order change due to contrast and low GFR. Also spoke with Dr. Augustin Coupe of nephroloy to clarify. Per nephro it would be beneficial to coordinate this MRI so that patient can be scheduled for HD appropriately afterwards. Update given to [place] of  MRI. Phone number also given to MRI for correspondence with Dr. Augustin Coupe. Dorthey Sawyer, RN

## 2019-09-13 NOTE — Progress Notes (Addendum)
PROGRESS NOTE    Melanie Hall  CMK:[id number] DOB: 12/19/1970 DOA: 09/09/2019 PCP: [facility] [place]    Brief Narrative:  HPI per Dr. Timmothy Euler Melanie Hall is a 49 y.o. female with medical history significant of HTN; DM; and ESRD recently started on HD presenting with weakness.  She is not able to walk.  This has been going on for 4 weeks.  She is not certain why this is happened.  She started HD 4 weeks ago and the inability to walk started at the same time.  Her R arm is also weak.  She is having numbness and tingling of her arms and legs.  +urinary and fecal incontinence for the same length of time.  Nothing changed yesterday to make her come to the ER.  She has a mild headache.  No visual disturbance.  No dysphagia.  The patient appeared frustrated and reluctant to provide history.  I spoke with her daughter, who brought her to the ER.  She has been been out of the hospital for 2-3 weeks.  During the time she was hospitalized, she only got up once to walk and has been unable to walk since.  [facility] has been helping at home, and they raised concerns about her inability to walk.  She is not taking medications, no insulin since discharge.  Her daughter is taking her to HD qMWF and otherwise only wants fast food, doesn't want to eat right.  She has a couple of bedsores.  Her daughter this is physical and psychological.  She had prior h/o leg surgery and refused therapy and mostly stopped moving around and her health declined accordingly.  She walked with a walker until a couple of months ago, but never really walked that much after her leg surgery.  Yesterday, while her daughter talked to the triage nurse, her mother stood with the triage nurse but she won't do that at home.  She voids/stools at home and will not attempt to get out of bed to use the bedside commode.  Her daughter thinks she needs SNF rehab, but the patient has refused that.   They are thinking APS may need to be involved.  Her  sister moved in to her house 2 days ago and her mother didn't like it that she was making her move too much.  There is someone bringing her fast food and her mother refuses to eat healthy foods. At this point, "we tried it, we tried to let her be at home..." but it isn't working.  Strong FH of mental health disorders; her 2nd husband was killed; daughter agrees with psychiatry evaluation.   ED Course: Has UTI and needs MRI of spine.  Showed up at [facility], concern for cauda equina, unable to get MRI effectively.  Likely needs anesthesia for MRI.  Acute vs. Chronic weakness, urinary incontinence.  Teleneuro recommended MRI while at [facility].   Assessment & Plan:   Principal Problem:   Debility Active Problems:   Type 2 diabetes with nephropathy ([place])   HYPERCHOLESTEROLEMIA   Hypertension associated with diabetes ([place])   ESRD (end stage renal disease) on dialysis ([place])   Depression   Incontinence in female   Pressure ulcer   Acute cystitis without hematuria   Weakness   Weakness of both lower extremities  1 debility with fecal/urinary incontinence Questionable etiology.  Concern for cauda equina syndrome. Patient noted to have remote history of leg surgery(left femur fracture April 2020) and since then has not been  very mobile at that time.  Patient noted to have been recently hospitalized from January 30-February 16 and started on hemodialysis and since then and really getting out of bed.  Patient states feels extremely weak after hemodialysis sessions.  Per admitting physician patient noted to have absent reflexes felt secondary to diabetic polyneuropathy.  Patient with bowel and bladder incontinence concerning for cauda equina syndrome however.  Admitting MD it was felt patient's incontinence could also be more associated with patient being unable/unwilling to get out of bed for toileting.  MRI C-spine with no acute abnormalities noted.  It was noted patient unable to tolerate MRI even with  anxiolytics and as such further MRI necessary may need to be under general anesthesia.  RBC folate at 1312, CRP at 5, vitamin B12 at 818, lactic acid level at 0.8, sed rate at 45, TSH at 3.242.  MRI T-spine and L-spine pending.  Per RN MRI with anesthesia only done on Tuesdays and Thursdays.  Neurontin increased to 300 mg twice daily per neurology recommendations.  PT/OT.  Per neurology if MRI negative will need outpatient EMG/nerve conduction study with neurology. Patient seen in consultation by neurology who are following.  2.  End-stage renal disease MWF Patient recently started on hemodialysis at the [facility] Monday Wednesday Friday.  Patient noted to have had a full treatment on Wednesday.  Patient seen in consultation by nephrology and patient underwent hemodialysis on 09/11/2019.  Per nephrology.   3.  Anemia of chronic disease Anemia panel from 08/09/2019 with iron of 22, TIBC of 203, transferrin of 145, folate of 1440.  Hemoglobin currently 8.9 this morning.  Per nephrology.  4.  Depression Concerned that patient's symptoms may have a psychogenic component in place.  Psychiatry consultation obtained and was felt patient did not meet inpatient criteria and recommended social work follow-up with daughter due to voice concerns.  Patient started on Zoloft per psych recommendations.  5.  Hypothyroidism Synthroid.   6.  Hypertension Norvasc and Coreg.  Patient on hemodialysis.   7.  Hyperlipidemia Statin.   8.  Diabetes mellitus type 2 Hemoglobin A1c of 7.2.  CBG of 117 this morning.  Continue sliding scale insulin.  9. Vaginal candidiasis Diflucan 150 mg p.o. x1.  Place on clotrimazole cream nightly.   DVT prophylaxis: Heparin Code Status: Full Family Communication: Updated patient.  No family at bedside. Disposition Plan:  . Patient came from: Home            . Anticipated d/c place: Likely skilled nursing facility . Barriers to d/c OR conditions which need to be  met to effect a safe d/c: Likely skilled nursing facility once cleared by neurology, nephrology and evaluation by PT/OT when MRI resulted.   Consultants:   Telemetry neurology: Dr. Roanna Raider 09/10/2019  Neuro hospitalist: Dr. Lorraine Lax 09/10/2019  Psychiatry: Dr. Dwyane Dee 09/11/2019  Nephrology: Dr. Augustin Coupe 09/10/2019  Procedures:   MRI T-spine pending  CT head 09/10/2019  Chest x-ray 09/10/2019  MRI C-spine 09/10/2019  Antimicrobials:   IV Rocephin 09/10/2019>>> 09/11/2019   Subjective: Patient laying in bed.  Patient denies chest pain.  Does complain of some shortness of breath.  Complaining of numbness and tingling in feet bilaterally as well as occasional right lower extremity shooting pain.  Per RN MRI will need done with anesthesia on Tuesdays and Thursdays.  Patient with stool in bed.  Objective: Vitals:   09/12/19 1700 09/12/19 2134 09/13/19 0316 09/13/19 0900  BP: (!) 148/80 135/67 (!) 157/77 Marland Kitchen)  148/75  Pulse: 88 79 80 72  Resp: 18   17  Temp: 98.6 F (37 C) 98.2 F (36.8 C) 98.6 F (37 C) 98 F (36.7 C)  TempSrc: Oral Oral Oral Axillary  SpO2: 99% 97% 97% 98%  Weight:      Height:        Intake/Output Summary (Last 24 hours) at 09/13/2019 1152 Last data filed at 09/13/2019 0852 Gross per 24 hour  Intake 840 ml  Output 400 ml  Net 440 ml   Filed Weights   09/11/19 1515 09/11/19 1857 09/11/19 2205  Weight: 76.2 kg 74.2 kg 73.4 kg    Examination:  General exam: NAD Respiratory system: Bibasilar crackles.  No wheezes.  No rhonchi.  Normal respiratory effort.   Cardiovascular system: RRR no murmurs rubs or gallops.  No JVD.  No lower extremity edema.  Gastrointestinal system: Abdomen is nontender, nondistended, soft, positive bowel sounds.  No rebound.  No guarding.  Central nervous system: Alert and oriented. No focal neurological deficits.  Moving extremities spontaneously. Genitourinary: Vaginal candidiasis. Extremities: Symmetric 5 x 5 power. Skin: No rashes, lesions or  ulcers Psychiatry: Judgement and insight appear normal. Mood & affect appropriate.     Data Reviewed: I have personally reviewed following labs and imaging studies  CBC: Recent Labs  Lab 09/10/19 0021 09/10/19 1820 09/11/19 0626 09/12/19 0349 09/13/19 0434  WBC 9.4  --  6.7 6.4  --   NEUTROABS 6.7  --   --  3.8  --   HGB 10.0*  --  9.7* 8.8* 8.9*  HCT 32.3* 32.1* 32.1* 28.7* 29.0*  MCV 100.6*  --  101.9* 98.6  --   PLT 171  --  148* 153  --    Basic Metabolic Panel: Recent Labs  Lab 09/10/19 0021 09/11/19 0626 09/12/19 0349 09/13/19 0434  NA 135 141 138 139  K 3.4* 4.1 3.7 4.5  CL 99 101 100 101  CO2 _0 GLUCOSE 281* 150* 123* 129*  BUN _1 CREATININE 2.45* 3.62* 2.58* 3.46*  CALCIUM 8.1* 8.7* 8.2* 8.3*  PHOS  --  4.0 2.9 3.8   GFR: Estimated Creatinine Clearance: 18.9 mL/min (A) (by C-G formula based on SCr of 3.46 mg/dL (H)). Liver Function Tests: Recent Labs  Lab 09/10/19 0021 09/12/19 0349 09/13/19 0434  AST 15  --   --   ALT 17  --   --   ALKPHOS 154*  --   --   BILITOT 1.2  --   --   PROT 6.6  --   --   ALBUMIN 2.9* 2.5* 2.6*   No results for input(s): LIPASE, AMYLASE in the last 168 hours. No results for input(s): AMMONIA in the last 168 hours. Coagulation Profile: No results for input(s): INR, PROTIME in the last 168 hours. Cardiac Enzymes: No results for input(s): CKTOTAL, CKMB, CKMBINDEX, TROPONINI in the last 168 hours. BNP (last 3 results) No results for input(s): PROBNP in the last 8760 hours. HbA1C: Recent Labs    09/10/19 1820  HGBA1C 7.2*   CBG: Recent Labs  Lab 09/12/19 0725 09/12/19 1111 09/12/19 1606 09/12/19 2135 09/13/19 0717  GLUCAP 94 118* 211* 151* 117*   Lipid Profile: No results for input(s): CHOL, HDL, LDLCALC, TRIG, CHOLHDL, LDLDIRECT in the last 72 hours. Thyroid Function Tests: Recent Labs    09/10/19 1544  TSH 3.242   Anemia Panel: Recent Labs    09/10/19 1544 09/12/19 0349  VITAMINB12 818  --   FERRITIN  --  289  TIBC  --  235*  IRON  --  31   Sepsis Labs: Recent Labs  Lab 09/10/19 0021  LATICACIDVEN 0.8    Recent Results (from the past 240 hour(s))  Blood culture (routine x 2)     Status: None (Preliminary result)   Collection Time: 09/10/19 12:22 AM   Specimen: BLOOD  Result Value Ref Range Status   Specimen Description BLOOD BLOOD RIGHT WRIST  Final   Special Requests   Final    BOTTLES DRAWN AEROBIC AND ANAEROBIC Blood Culture adequate volume   Culture   Final    NO GROWTH 2 DAYS Performed at [facility], [address], [place], [place] [address]    Report Status PENDING  Incomplete  Blood culture (routine x 2)     Status: None (Preliminary result)   Collection Time: 09/10/19 12:28 AM   Specimen: BLOOD  Result Value Ref Range Status   Specimen Description BLOOD RIGHT ANTECUBITAL  Final   Special Requests   Final    BOTTLES DRAWN AEROBIC AND ANAEROBIC Blood Culture adequate volume   Culture   Final    NO GROWTH 2 DAYS Performed at [facility], [address], [place], [place] [address]    Report Status PENDING  Incomplete  Urine Culture     Status: Abnormal   Collection Time: 09/10/19  1:29 AM   Specimen: Urine, Random  Result Value Ref Range Status   Specimen Description   Final    URINE, RANDOM Performed at [facility], [address] [place], [place] [address]    Special Requests   Final    NONE Performed at [facility], [address], [place], [place] [address]    Culture MULTIPLE SPECIES PRESENT, SUGGEST RECOLLECTION (A)  Final   Report Status 09/11/2019 FINAL  Final  Respiratory Panel by RT PCR (Flu A&B, Covid) - Nasopharyngeal Swab     Status: None   Collection Time: 09/10/19  1:29 AM   Specimen: Nasopharyngeal Swab  Result Value Ref Range Status   SARS Coronavirus 2 by RT PCR NEGATIVE NEGATIVE Final    Comment: (NOTE) SARS-CoV-2 target nucleic acids are NOT DETECTED. The SARS-CoV-2 RNA is generally detectable in  upper respiratoy specimens during the acute phase of infection. The lowest concentration of SARS-CoV-2 viral copies this assay can detect is 131 copies/mL. A negative result does not preclude SARS-Cov-2 infection and should not be used as the sole basis for treatment or other patient management decisions. A negative result may occur with  improper specimen collection/handling, submission of specimen other than nasopharyngeal swab, presence of viral mutation(s) within the areas targeted by this assay, and inadequate number of viral copies (<131 copies/mL). A negative result must be combined with clinical observations, patient history, and epidemiological information. The expected result is Negative. Fact Sheet for Patients:  PinkCheek.be Fact Sheet for Healthcare Providers:  GravelBags.it This test is not yet ap proved or cleared by the [place] FDA and  has been authorized for detection and/or diagnosis of SARS-CoV-2 by FDA under an Emergency Use Authorization (EUA). This EUA will remain  in effect (meaning this test can be used) for the duration of the COVID-19 declaration under Section 564(b)(1) of the Act, 21 U.S.C. section 360bbb-3(b)(1), unless the authorization is terminated or revoked sooner.    Influenza A by PCR NEGATIVE NEGATIVE Final   Influenza B by PCR NEGATIVE NEGATIVE Final    Comment: (  NOTE) The Xpert Xpress SARS-CoV-2/FLU/RSV assay is intended as an aid in  the diagnosis of influenza from Nasopharyngeal swab specimens and  should not be used as a sole basis for treatment. Nasal washings and  aspirates are unacceptable for Xpert Xpress SARS-CoV-2/FLU/RSV  testing. Fact Sheet for Patients: PinkCheek.be Fact Sheet for Healthcare Providers: GravelBags.it This test is not yet approved or cleared by the [place] FDA and  has been authorized for  detection and/or diagnosis of SARS-CoV-2 by  FDA under an Emergency Use Authorization (EUA). This EUA will remain  in effect (meaning this test can be used) for the duration of the  Covid-19 declaration under Section 564(b)(1) of the Act, 21  U.S.C. section 360bbb-3(b)(1), unless the authorization is  terminated or revoked. Performed at [facility], [address] [place], [place] [address]          Radiology Studies: No results found.      Scheduled Meds: . amLODipine  5 mg Oral Daily  . carvedilol  6.25 mg Oral BID WC  . Chlorhexidine Gluconate Cloth  6 each Topical Q0600  . gabapentin  300 mg Oral BID  . heparin  5,000 Units Subcutaneous Q8H  . insulin aspart  0-5 Units Subcutaneous QHS  . insulin aspart  0-6 Units Subcutaneous TID WC  . levothyroxine  25 mcg Oral QAC breakfast  . multivitamin  1 tablet Oral QHS  . pantoprazole  40 mg Oral Daily  . pravastatin  40 mg Oral q1800  . sertraline  25 mg Oral Daily   Continuous Infusions: . sodium chloride    . sodium chloride       LOS: 3 days    Time spent: 40 minutes    Irine Seal, MD [facility]   To contact the attending provider between 7A-7P or the covering provider during after hours 7P-7A, please log into the web site [email or web address] and access using universal [place] password for that web site. If you do not have the password, please call the hospital operator.  09/13/2019, 11:52 AM

## 2019-09-13 NOTE — Progress Notes (Signed)
Spoke to Radiologist about pt being a dialysis pt and current GFR of 15. Per radiologist, no contrast to be given to patient. Nephrologist did state he did not agree with decision, but is aware that per our radiologist we do not give contrast to pt with low GFR even if pt is undergoing dialysis. Order should be changed to W/O contrast.

## 2019-09-13 NOTE — Progress Notes (Signed)
[facility] Progress Note   49 y.o.femaleDM with polyneuropathy, ESRD MWF [facility] initiated HD late Feb 2021 here with leg weakness, bladder and bowel incontinence for 4 weeks. She also has saddle anesthesia and uses a walker but did sustain a left leg fracture 10/2018. She also has numbness in both legs. She denies f/c/n/v; also denies CP/ dyspnea/ cough/ myalgias. CT head negative for acute abnormalities; MRI cervical spine without contrast showed no acute or focal lesion to explain the patient's symptomswith nonspecific finding of straightening of the normal cervical lordosis.  Assessment/ Plan:   1. ESRD - recent start on dialysis at [facility] MWF last dialysis was a full treatment on Wednesday. - Next HD on Monday and will challenge weight.   2. Renal osteodystrophy - phos 2.9 -> no need for binder 3. Hypertension - increased carvedilol to 6.25 BID on 3/6 with better control, will try to challenge EDW on Mon. Hydralazine PRN + incr norvasc to 10mg  daily if needed. 4. Anemia - transfuse as needed-> @ goal; will also check iron studies and give ESA.  5. Leg weakness and incontinence r/o cord compression and caudal equina syndrome - w/u per primary.  Subjective:   Weakness in legs but denies f/c. Had retention yest w/ large residual req in and out cath (400 cloudy return) Nausea also and didn't eat dinner   Objective:   BP (!) 157/77 (BP Location: Right Arm)   Pulse 80   Temp 98.6 F (37 C) (Oral)   Resp 18   Ht 5\' 3"  (1.6 m)   Wt 73.4 kg   LMP 12/17/2016   SpO2 97%   BMI 28.66 kg/m   Intake/Output Summary (Last 24 hours) at 09/13/2019 0815 Last data filed at 09/13/2019 0200 Gross per 24 hour  Intake 840 ml  Output 400 ml  Net 440 ml   Weight change:   Physical Exam: General appearance:alert, cooperative and appears stated age Head:Normocephalic, without obvious abnormality, atraumatic Eyes:negative Back:symmetric, no curvature.  ROM normal. No CVA tenderness. Resp:clear to auscultation bilaterally Cardio:S1, S2 normal UM:PNTI, non-tender; bowel sounds normal; no masses, no organomegaly Extremities:[place] Pulses:2+ and symmetric Access: RIJ TC, LUA BCF (bruit but needs work)  Imaging: No results found.  Labs: BMET Recent Labs  Lab 09/10/19 0021 09/11/19 0626 09/12/19 0349 09/13/19 0434  NA 135 141 138 139  K 3.4* 4.1 3.7 4.5  CL 99 101 100 101  CO2 26 25 27 27   GLUCOSE 281* 150* 123* 129*  BUN 14 19 10 19   CREATININE 2.45* 3.62* 2.58* 3.46*  CALCIUM 8.1* 8.7* 8.2* 8.3*  PHOS  --  4.0 2.9 3.8   CBC Recent Labs  Lab 09/10/19 0021 09/10/19 1820 09/11/19 0626 09/12/19 0349 09/13/19 0434  WBC 9.4  --  6.7 6.4  --   NEUTROABS 6.7  --   --  3.8  --   HGB 10.0*  --  9.7* 8.8* 8.9*  HCT 32.3* 32.1* 32.1* 28.7* 29.0*  MCV 100.6*  --  101.9* 98.6  --   PLT 171  --  148* 153  --     Medications:    . amLODipine  5 mg Oral Daily  . carvedilol  6.25 mg Oral BID WC  . Chlorhexidine Gluconate Cloth  6 each Topical Q0600  . gabapentin  300 mg Oral BID  . heparin  5,000 Units Subcutaneous Q8H  . insulin aspart  0-5 Units Subcutaneous QHS  . insulin aspart  0-6 Units Subcutaneous TID  WC  . levothyroxine  25 mcg Oral QAC breakfast  . multivitamin  1 tablet Oral QHS  . pantoprazole  40 mg Oral Daily  . pravastatin  40 mg Oral q1800  . sertraline  25 mg Oral Daily      Otelia Santee, MD 09/13/2019, 8:15 AM

## 2019-09-13 NOTE — Progress Notes (Signed)
Spoke to pt nurse at this time. Pt GFR too low to give contrast and we need the order to be changed. Tried to bring pt down, nurse informed me pt is highly claustro and will most likely need to be done under anesthesia. Nurse aware that anesthesia cases are typically done on Tuesdays and Thursdays. Info will be passed to doctor.

## 2019-09-14 DIAGNOSIS — R195 Other fecal abnormalities: Secondary | ICD-10-CM

## 2019-09-14 LAB — RENAL FUNCTION PANEL
Albumin: 2.6 g/dL — ABNORMAL LOW (ref 3.5–5.0)
Anion gap: 11 (ref 5–15)
BUN: 29 mg/dL — ABNORMAL HIGH (ref 6–20)
CO2: 26 mmol/L (ref 22–32)
Calcium: 8.5 mg/dL — ABNORMAL LOW (ref 8.9–10.3)
Chloride: 101 mmol/L (ref 98–111)
Creatinine, Ser: 4.68 mg/dL — ABNORMAL HIGH (ref 0.44–1.00)
GFR calc Af Amer: 12 mL/min — ABNORMAL LOW (ref >60)
GFR calc non Af Amer: 10 mL/min — ABNORMAL LOW (ref >60)
Glucose, Bld: 161 mg/dL — ABNORMAL HIGH (ref 70–99)
Phosphorus: 4.4 mg/dL (ref 2.5–4.6)
Potassium: 4.3 mmol/L (ref 3.5–5.1)
Sodium: 138 mmol/L (ref 135–145)

## 2019-09-14 LAB — CBC WITH DIFFERENTIAL/PLATELET
Abs Immature Granulocytes: 0.02 10*3/uL (ref 0.00–0.07)
Basophils Absolute: 0.1 10*3/uL (ref 0.0–0.1)
Basophils Relative: 1 %
Eosinophils Absolute: 0.3 10*3/uL (ref 0.0–0.5)
Eosinophils Relative: 5 %
HCT: 28.2 % — ABNORMAL LOW (ref 36.0–46.0)
Hemoglobin: 8.6 g/dL — ABNORMAL LOW (ref 12.0–15.0)
Immature Granulocytes: 0 %
Lymphocytes Relative: 23 %
Lymphs Abs: 1.3 10*3/uL (ref 0.7–4.0)
MCH: 30.2 pg (ref 26.0–34.0)
MCHC: 30.5 g/dL (ref 30.0–36.0)
MCV: 98.9 fL (ref 80.0–100.0)
Monocytes Absolute: 0.6 10*3/uL (ref 0.1–1.0)
Monocytes Relative: 11 %
Neutro Abs: 3.4 10*3/uL (ref 1.7–7.7)
Neutrophils Relative %: 60 %
Platelets: 154 10*3/uL (ref 150–400)
RBC: 2.85 MIL/uL — ABNORMAL LOW (ref 3.87–5.11)
RDW: 15.9 % — ABNORMAL HIGH (ref 11.5–15.5)
WBC: 5.6 10*3/uL (ref 4.0–10.5)
nRBC: 0 % (ref 0.0–0.2)

## 2019-09-14 LAB — GLUCOSE, CAPILLARY
Glucose-Capillary: 179 mg/dL — ABNORMAL HIGH (ref 70–99)
Glucose-Capillary: 143 mg/dL — ABNORMAL HIGH (ref 70–99)
Glucose-Capillary: 190 mg/dL — ABNORMAL HIGH (ref 70–99)
Glucose-Capillary: 109 mg/dL — ABNORMAL HIGH (ref 70–99)
Glucose-Capillary: 155 mg/dL — ABNORMAL HIGH (ref 70–99)

## 2019-09-14 MED ORDER — SODIUM CHLORIDE 0.9 % IV SOLN
125.0000 mg | INTRAVENOUS | Status: DC
  Administered 2019-09-16 – 2019-09-18 (×2): 125 mg via INTRAVENOUS
  Filled 2019-09-14 (×5): qty 10

## 2019-09-14 MED ORDER — HEPARIN SODIUM (PORCINE) 1000 UNIT/ML IJ SOLN
INTRAMUSCULAR | Status: AC
  Filled 2019-09-14: qty 4

## 2019-09-14 MED ORDER — DARBEPOETIN ALFA 40 MCG/0.4ML IJ SOSY
40.0000 ug | PREFILLED_SYRINGE | INTRAMUSCULAR | Status: DC
  Administered 2019-09-14: 40 ug via INTRAVENOUS

## 2019-09-14 MED ORDER — DARBEPOETIN ALFA 40 MCG/0.4ML IJ SOSY
PREFILLED_SYRINGE | INTRAMUSCULAR | Status: AC
  Filled 2019-09-14: qty 0.4

## 2019-09-14 MED ORDER — GABAPENTIN 300 MG PO CAPS
300.0000 mg | ORAL_CAPSULE | Freq: Every day | ORAL | Status: DC
  Administered 2019-09-15 – 2019-09-18 (×4): 300 mg via ORAL
  Filled 2019-09-14 (×4): qty 1

## 2019-09-14 NOTE — Progress Notes (Signed)
Pt was seen for attempted visit but her transport for HD arrived as PT had.  Retry at another time as time and pt allow.   09/14/19 1400  PT Visit Information  Last PT Received On 09/14/19  Reason Eval/Treat Not Completed Patient at procedure or test/unavailable    Mee Hives, PT MS Acute Rehab Dept. Number: [place] [facility] [place]

## 2019-09-14 NOTE — Plan of Care (Signed)
  Problem: Clinical Measurements: Goal: Ability to maintain clinical measurements within normal limits will improve Outcome: Progressing   

## 2019-09-14 NOTE — Progress Notes (Signed)
PROGRESS NOTE    Melanie Hall  CMK:[id number] DOB: 12/19/1970 DOA: 09/09/2019 PCP: [facility] [place]    Brief Narrative:  HPI per Dr. Timmothy Euler Melanie Hall is a 49 y.o. female with medical history significant of HTN; DM; and ESRD recently started on HD presenting with weakness.  She is not able to walk.  This has been going on for 4 weeks.  She is not certain why this is happened.  She started HD 4 weeks ago and the inability to walk started at the same time.  Her R arm is also weak.  She is having numbness and tingling of her arms and legs.  +urinary and fecal incontinence for the same length of time.  Nothing changed yesterday to make her come to the ER.  She has a mild headache.  No visual disturbance.  No dysphagia.  The patient appeared frustrated and reluctant to provide history.  I spoke with her daughter, who brought her to the ER.  She has been been out of the hospital for 2-3 weeks.  During the time she was hospitalized, she only got up once to walk and has been unable to walk since.  [facility] has been helping at home, and they raised concerns about her inability to walk.  She is not taking medications, no insulin since discharge.  Her daughter is taking her to HD qMWF and otherwise only wants fast food, doesn't want to eat right.  She has a couple of bedsores.  Her daughter this is physical and psychological.  She had prior h/o leg surgery and refused therapy and mostly stopped moving around and her health declined accordingly.  She walked with a walker until a couple of months ago, but never really walked that much after her leg surgery.  Yesterday, while her daughter talked to the triage nurse, her mother stood with the triage nurse but she won't do that at home.  She voids/stools at home and will not attempt to get out of bed to use the bedside commode.  Her daughter thinks she needs SNF rehab, but the patient has refused that.   They are thinking APS may need to be involved.  Her  sister moved in to her house 2 days ago and her mother didn't like it that she was making her move too much.  There is someone bringing her fast food and her mother refuses to eat healthy foods. At this point, "we tried it, we tried to let her be at home..." but it isn't working.  Strong FH of mental health disorders; her 2nd husband was killed; daughter agrees with psychiatry evaluation.   ED Course: Has UTI and needs MRI of spine.  Showed up at [facility], concern for cauda equina, unable to get MRI effectively.  Likely needs anesthesia for MRI.  Acute vs. Chronic weakness, urinary incontinence.  Teleneuro recommended MRI while at [facility].   Assessment & Plan:   Principal Problem:   Debility Active Problems:   Type 2 diabetes with nephropathy ([place])   HYPERCHOLESTEROLEMIA   Hypertension associated with diabetes ([place])   ESRD (end stage renal disease) on dialysis ([place])   Depression   Incontinence in female   Pressure ulcer   Acute cystitis without hematuria   Weakness   Weakness of both lower extremities  1 debility with fecal/urinary incontinence Questionable etiology.  Concern for cauda equina syndrome. Patient noted to have remote history of leg surgery(left femur fracture April 2020) and since then has not been  very mobile at that time.  Patient noted to have been recently hospitalized from January 30-February 16 and started on hemodialysis and since then and really getting out of bed.  Patient states feels extremely weak after hemodialysis sessions.  Per admitting physician patient noted to have absent reflexes felt secondary to diabetic polyneuropathy.  Patient with bowel and bladder incontinence concerning for cauda equina syndrome however.  Admitting MD it was felt patient's incontinence could also be more associated with patient being unable/unwilling to get out of bed for toileting.  MRI C-spine with no acute abnormalities noted.  It was noted patient unable to tolerate MRI even with  anxiolytics and as such further MRI necessary may need to be under general anesthesia.  RBC folate at 1312, CRP at 5, vitamin B12 at 818, lactic acid level at 0.8, sed rate at 45, TSH at 3.242.  MRI T-spine and L-spine pending.  Per RN MRI with anesthesia only done on Tuesdays and Thursdays.  Neurontin increased to 300 mg twice daily per neurology recommendations.  PT/OT.  Per neurology if MRI negative will need outpatient EMG/nerve conduction study with neurology.  It was noted per x-ray that due to patient's renal function MRI needed to be changed to without contrast.  Spoke with neurology on 09/13/2019 who felt it was okay to reorder MRI without contrast which was done.  Patient needed anesthesia and as such awaiting MRI of the T and L-spine to be done.  Neurology following.  2.  End-stage renal disease MWF Patient recently started on hemodialysis at the [facility] Monday Wednesday Friday.  Patient noted to have had a full treatment on Wednesday.  Patient seen in consultation by nephrology and patient underwent hemodialysis on 09/11/2019.  Patient for hemodialysis today.  Per nephrology.   3.  Anemia of chronic disease Anemia panel from 08/09/2019 with iron of 22, TIBC of 203, transferrin of 145, folate of 1440.  Hemoglobin currently 8.6 this morning.  Per nephrology.  4.  Depression Concerned that patient's symptoms may have a psychogenic component in place.  Psychiatry consultation obtained and was felt patient did not meet inpatient criteria and recommended social work follow-up with daughter due to voice concerns.  Patient started on Zoloft per psych recommendations.  5.  Hypothyroidism Continue Synthroid.   6.  Hypertension Norvasc and Coreg.  Patient on hemodialysis.   7.  Hyperlipidemia Statin.   8.  Diabetes mellitus type 2 Hemoglobin A1c of 7.2.  CBG of 143 this morning.  Continue sliding scale insulin.  9. Vaginal candidiasis Status post Diflucan 150 mg p.o. x1.   Continue clotrimazole cream nightly.  10.  Loose stools Patient had presented with bladder and bowel incontinence.  MRI under anesthesia of T and L-spine pending per RN patient stools are more loose or watery this morning.  Check a C. difficile PCR.  Check a GI pathogen panel.  Follow.   DVT prophylaxis: Heparin Code Status: Full Family Communication: Updated patient.  No family at bedside. Disposition Plan:  . Patient came from: Home            . Anticipated d/c place: Likely skilled nursing facility . Barriers to d/c OR conditions which need to be met to effect a safe d/c: Likely skilled nursing facility once cleared by neurology, nephrology and evaluation by PT/OT when MRI resulted.   Consultants:   Telemetry neurology: Dr. Roanna Raider 09/10/2019  Neuro hospitalist: Dr. Lorraine Lax 09/10/2019  Psychiatry: Dr. Dwyane Dee 09/11/2019  Nephrology: Dr. Augustin Coupe 09/10/2019  Procedures:   MRI T-spine and L-spine pending  CT head 09/10/2019  Chest x-ray 09/10/2019  MRI C-spine 09/10/2019  Antimicrobials:   IV Rocephin 09/10/2019>>> 09/11/2019   Subjective: Patient laying in bed.  Per RN patient stools are more loose and watery.  Patient noted to have had about six loose stools since after midnight.  Denies any chest pain.  States some shortness of breath.  Awaiting MRI.   Objective: Vitals:   09/13/19 2038 09/14/19 0507 09/14/19 0854 09/14/19 0856  BP: (!) 158/84 (!) 157/73 (!) 153/78   Pulse: 77 75 (!) 7 76  Resp: '18 20 18   '$ Temp: 98.4 F (36.9 C) 97.9 F (36.6 C) 97.8 F (36.6 C)   TempSrc: Oral Oral Oral   SpO2: 98% 96% 98%   Weight: 77.3 kg     Height:        Intake/Output Summary (Last 24 hours) at 09/14/2019 1136 Last data filed at 09/14/2019 0804 Gross per 24 hour  Intake 640 ml  Output 600 ml  Net 40 ml   Filed Weights   09/11/19 1857 09/11/19 2205 09/13/19 2038  Weight: 74.2 kg 73.4 kg 77.3 kg    Examination:  General exam: NAD Respiratory system: Bibasilar crackles.  No wheezing.   No rhonchi.  Speaking in full sentences.  Normal respiratory effort.   Cardiovascular system: Regular rate and rhythm no murmurs rubs or gallops.  No JVD.  No lower extremity edema.  Gastrointestinal system: Abdomen is soft, nontender, nondistended, positive bowel sounds.  No rebound.  No guarding.    Central nervous system: Alert and oriented. No focal neurological deficits.  Moving extremities spontaneously. Genitourinary: Vaginal candidiasis. Extremities: Symmetric 5 x 5 power. Skin: No rashes, lesions or ulcers Psychiatry: Judgement and insight appear normal. Mood & affect appropriate.     Data Reviewed: I have personally reviewed following labs and imaging studies  CBC: Recent Labs  Lab 09/10/19 0021 09/10/19 1820 09/11/19 0626 09/12/19 0349 09/13/19 0434 09/14/19 0434  WBC 9.4  --  6.7 6.4  --  5.6  NEUTROABS 6.7  --   --  3.8  --  3.4  HGB 10.0*  --  9.7* 8.8* 8.9* 8.6*  HCT 32.3* 32.1* 32.1* 28.7* 29.0* 28.2*  MCV 100.6*  --  101.9* 98.6  --  98.9  PLT 171  --  148* 153  --  017   Basic Metabolic Panel: Recent Labs  Lab 09/10/19 0021 09/11/19 0626 09/12/19 0349 09/13/19 0434 09/14/19 0434  NA 135 141 138 139 138  K 3.4* 4.1 3.7 4.5 4.3  CL 99 101 100 101 101  CO2 '26 25 27 27 26  '$ GLUCOSE 281* 150* 123* 129* 161*  BUN '14 19 10 19 '$ 29*  CREATININE 2.45* 3.62* 2.58* 3.46* 4.68*  CALCIUM 8.1* 8.7* 8.2* 8.3* 8.5*  PHOS  --  4.0 2.9 3.8 4.4   GFR: Estimated Creatinine Clearance: 14.3 mL/min (A) (by C-G formula based on SCr of 4.68 mg/dL (H)). Liver Function Tests: Recent Labs  Lab 09/10/19 0021 09/12/19 0349 09/13/19 0434 09/14/19 0434  AST 15  --   --   --   ALT 17  --   --   --   ALKPHOS 154*  --   --   --   BILITOT 1.2  --   --   --   PROT 6.6  --   --   --   ALBUMIN 2.9* 2.5* 2.6* 2.6*   No results for input(s):  LIPASE, AMYLASE in the last 168 hours. No results for input(s): AMMONIA in the last 168 hours. Coagulation Profile: No results for  input(s): INR, PROTIME in the last 168 hours. Cardiac Enzymes: No results for input(s): CKTOTAL, CKMB, CKMBINDEX, TROPONINI in the last 168 hours. BNP (last 3 results) No results for input(s): PROBNP in the last 8760 hours. HbA1C: No results for input(s): HGBA1C in the last 72 hours. CBG: Recent Labs  Lab 09/13/19 1143 09/13/19 1625 09/13/19 2036 09/14/19 0703 09/14/19 1116  GLUCAP 179* 160* 151* 143* 190*   Lipid Profile: No results for input(s): CHOL, HDL, LDLCALC, TRIG, CHOLHDL, LDLDIRECT in the last 72 hours. Thyroid Function Tests: No results for input(s): TSH, T4TOTAL, FREET4, T3FREE, THYROIDAB in the last 72 hours. Anemia Panel: Recent Labs    09/12/19 0349  FERRITIN 289  TIBC 235*  IRON 31   Sepsis Labs: Recent Labs  Lab 09/10/19 0021  LATICACIDVEN 0.8    Recent Results (from the past 240 hour(s))  Blood culture (routine x 2)     Status: None (Preliminary result)   Collection Time: 09/10/19 12:22 AM   Specimen: BLOOD  Result Value Ref Range Status   Specimen Description BLOOD BLOOD RIGHT WRIST  Final   Special Requests   Final    BOTTLES DRAWN AEROBIC AND ANAEROBIC Blood Culture adequate volume   Culture   Final    NO GROWTH 4 DAYS Performed at [facility], [address] [place], [place] [address]    Report Status PENDING  Incomplete  Blood culture (routine x 2)     Status: None (Preliminary result)   Collection Time: 09/10/19 12:28 AM   Specimen: BLOOD  Result Value Ref Range Status   Specimen Description BLOOD RIGHT ANTECUBITAL  Final   Special Requests   Final    BOTTLES DRAWN AEROBIC AND ANAEROBIC Blood Culture adequate volume   Culture   Final    NO GROWTH 4 DAYS Performed at [facility], [address] [place], [place] [address]    Report Status PENDING  Incomplete  Urine Culture     Status: Abnormal   Collection Time: 09/10/19  1:29 AM   Specimen: Urine, Random  Result Value Ref Range Status   Specimen Description   Final    URINE,  RANDOM Performed at [facility], [address] [place], [place] [address]    Special Requests   Final    NONE Performed at [facility], [address], [place], [place] [address]    Culture MULTIPLE SPECIES PRESENT, SUGGEST RECOLLECTION (A)  Final   Report Status 09/11/2019 FINAL  Final  Respiratory Panel by RT PCR (Flu A&B, Covid) - Nasopharyngeal Swab     Status: None   Collection Time: 09/10/19  1:29 AM   Specimen: Nasopharyngeal Swab  Result Value Ref Range Status   SARS Coronavirus 2 by RT PCR NEGATIVE NEGATIVE Final    Comment: (NOTE) SARS-CoV-2 target nucleic acids are NOT DETECTED. The SARS-CoV-2 RNA is generally detectable in upper respiratoy specimens during the acute phase of infection. The lowest concentration of SARS-CoV-2 viral copies this assay can detect is 131 copies/mL. A negative result does not preclude SARS-Cov-2 infection and should not be used as the sole basis for treatment or other patient management decisions. A negative result may occur with  improper specimen collection/handling, submission of specimen other than nasopharyngeal swab, presence of viral mutation(s) within the areas targeted by this assay, and inadequate number of viral copies (<131 copies/mL). A negative  result must be combined with clinical observations, patient history, and epidemiological information. The expected result is Negative. Fact Sheet for Patients:  PinkCheek.be Fact Sheet for Healthcare Providers:  GravelBags.it This test is not yet ap proved or cleared by the [place] FDA and  has been authorized for detection and/or diagnosis of SARS-CoV-2 by FDA under an Emergency Use Authorization (EUA). This EUA will remain  in effect (meaning this test can be used) for the duration of the COVID-19 declaration under Section 564(b)(1) of the Act, 21 U.S.C. section 360bbb-3(b)(1), unless the authorization is terminated  or revoked sooner.    Influenza A by PCR NEGATIVE NEGATIVE Final   Influenza B by PCR NEGATIVE NEGATIVE Final    Comment: (NOTE) The Xpert Xpress SARS-CoV-2/FLU/RSV assay is intended as an aid in  the diagnosis of influenza from Nasopharyngeal swab specimens and  should not be used as a sole basis for treatment. Nasal washings and  aspirates are unacceptable for Xpert Xpress SARS-CoV-2/FLU/RSV  testing. Fact Sheet for Patients: PinkCheek.be Fact Sheet for Healthcare Providers: GravelBags.it This test is not yet approved or cleared by the [place] FDA and  has been authorized for detection and/or diagnosis of SARS-CoV-2 by  FDA under an Emergency Use Authorization (EUA). This EUA will remain  in effect (meaning this test can be used) for the duration of the  Covid-19 declaration under Section 564(b)(1) of the Act, 21  U.S.C. section 360bbb-3(b)(1), unless the authorization is  terminated or revoked. Performed at [facility], [address], [place], [place] [address]          Radiology Studies: No results found.      Scheduled Meds: . amLODipine  5 mg Oral Daily  . carvedilol  6.25 mg Oral BID WC  . Chlorhexidine Gluconate Cloth  6 each Topical Q0600  . clotrimazole  1 Applicatorful Vaginal QHS  . gabapentin  300 mg Oral BID  . heparin  5,000 Units Subcutaneous Q8H  . insulin aspart  0-5 Units Subcutaneous QHS  . insulin aspart  0-6 Units Subcutaneous TID WC  . levothyroxine  25 mcg Oral QAC breakfast  . multivitamin  1 tablet Oral QHS  . pantoprazole  40 mg Oral Daily  . pravastatin  40 mg Oral q1800  . sertraline  25 mg Oral Daily   Continuous Infusions: . sodium chloride    . sodium chloride       LOS: 4 days    Time spent: 35 minutes    Irine Seal, MD [facility]   To contact the attending provider between 7A-7P or the covering provider during after hours 7P-7A, please log  into the web site [email or web address] and access using universal [place] password for that web site. If you do not have the password, please call the hospital operator.  09/14/2019, 11:36 AM

## 2019-09-14 NOTE — Progress Notes (Signed)
Pt c/o urge to void and cannot. NT bladder scanned pt and it shows 409cc. RN messaged NP on call to make aware. Awaiting response.   Eleanora Neighbor, RN

## 2019-09-14 NOTE — Procedures (Signed)
I have seen and examined this patient and agree with the plan of care.  Access to dialysis catheter.  Patient appears to be tolerating dialysis well  [name] 09/14/2019, 2:16 PM

## 2019-09-14 NOTE — Progress Notes (Signed)
[facility] ROUNDING NOTE   Subjective:   49 year old lady hypertension diabetes ESRD started on hemodialysis due to weakness.  She undergoes dialysis Monday Wednesday Friday through a right upper chest dialysis catheter.  The etiology of her debility with incontinence is being evaluated for cauda equina syndrome.  She is to undergo MRI of the lower spine.  She is currently undergoing her hemodialysis treatment center is [place] Monday Wednesday Friday.  Blood pressure 153/78 pulse 98 temperature 97.6.  O2 sats 98% 3 L  Amlodipine 5 mg daily carvedilol 6.25 mg twice daily Neurontin 300 mg twice daily, insulin sliding scale levothyroxine 25 mcg daily on Rena-Vite 1 daily Protonix 40 mg daily pravastatin 40 mg daily sertraline 25 mg daily.  Sodium 138 potassium 4.3 chloride 101 CO2 26 BUN 29 creatinine 4.68 glucose 161 phosphorus 4.4 albumin 2.6 hemoglobin 8.6.  MRI C-spine 09/10/2019 no acute or focal lesion to explain patient's symptoms.  CT scan no acute abnormality.  Hemoglobin A1c 7.2.  Iron saturation was 13%  Objective:  Vital signs in last 24 hours:  Temp:  [97.8 F (36.6 C)-98.4 F (36.9 C)] 97.8 F (36.6 C) (03/08 0854) Pulse Rate:  [7-80] 76 (03/08 0856) Resp:  [18-20] 18 (03/08 0854) BP: (153-159)/(73-98) 153/78 (03/08 0854) SpO2:  [92 %-98 %] 98 % (03/08 0854) Weight:  [77.3 kg] 77.3 kg (03/07 2038)  Weight change:  Filed Weights   09/11/19 1857 09/11/19 2205 09/13/19 2038  Weight: 74.2 kg 73.4 kg 77.3 kg    Intake/Output: I/O last 3 completed shifts: In: 840 [P.O.:840] Out: 600 [Urine:600]   Intake/Output this shift:  Total I/O In: 120 [P.O.:120] Out: -   General appearance:alert, cooperative and appears stated age Head:Normocephalic, without obvious abnormality, atraumatic Eyes:negative Back:symmetric, no curvature. ROM normal. No CVA tenderness. Resp:clear to auscultation bilaterally Cardio:S1, S2 normal JK:DTOI, non-tender;  bowel sounds normal; no masses, no organomegaly Extremities:[place] Pulses:2+ and symmetric Access: RIJ TC, LUA BCF (bruit but needs work)   Basic Metabolic Panel: Recent [employer] 09/10/19 0021 09/10/19 0021 09/11/19 7124 09/11/19 0626 09/12/19 0349 09/13/19 0434 09/14/19 0434  NA 135  --  141  --  138 139 138  K 3.4*  --  4.1  --  3.7 4.5 4.3  CL 99  --  101  --  100 101 101  CO2 26  --  25  --  27 27 26   GLUCOSE 281*  --  150*  --  123* 129* 161*  BUN 14  --  19  --  10 19 29*  CREATININE 2.45*  --  3.62*  --  2.58* 3.46* 4.68*  CALCIUM 8.1*   < > 8.7*   < > 8.2* 8.3* 8.5*  PHOS  --   --  4.0  --  2.9 3.8 4.4   < > = values in this interval not displayed.    Liver Function Tests: Recent Labs  Lab 09/10/19 0021 09/12/19 0349 09/13/19 0434 09/14/19 0434  AST 15  --   --   --   ALT 17  --   --   --   ALKPHOS 154*  --   --   --   BILITOT 1.2  --   --   --   PROT 6.6  --   --   --   ALBUMIN 2.9* 2.5* 2.6* 2.6*   No results for input(s): LIPASE, AMYLASE in the last 168 hours. No results for input(s): AMMONIA in the last 168  hours.  CBC: Recent Labs  Lab 09/10/19 0021 09/10/19 1820 09/11/19 0626 09/12/19 0349 09/13/19 0434 09/14/19 0434  WBC 9.4  --  6.7 6.4  --  5.6  NEUTROABS 6.7  --   --  3.8  --  3.4  HGB 10.0*  --  9.7* 8.8* 8.9* 8.6*  HCT 32.3* 32.1* 32.1* 28.7* 29.0* 28.2*  MCV 100.6*  --  101.9* 98.6  --  98.9  PLT 171  --  148* 153  --  154    Cardiac Enzymes: No results for input(s): CKTOTAL, CKMB, CKMBINDEX, TROPONINI in the last 168 hours.  BNP: Invalid input(s): POCBNP  CBG: Recent Labs  Lab 09/13/19 1143 09/13/19 1625 09/13/19 2036 09/14/19 0703 09/14/19 1116  GLUCAP 179* 160* 151* 143* 190*    Microbiology: Results for orders placed or performed during the hospital encounter of 09/09/19  Blood culture (routine x 2)     Status: None (Preliminary result)   Collection Time: 09/10/19 12:22 AM   Specimen: BLOOD  Result  Value Ref Range Status   Specimen Description BLOOD BLOOD RIGHT WRIST  Final   Special Requests   Final    BOTTLES DRAWN AEROBIC AND ANAEROBIC Blood Culture adequate volume   Culture   Final    NO GROWTH 4 DAYS Performed at [facility], [address] [place], [place] [address]    Report Status PENDING  Incomplete  Blood culture (routine x 2)     Status: None (Preliminary result)   Collection Time: 09/10/19 12:28 AM   Specimen: BLOOD  Result Value Ref Range Status   Specimen Description BLOOD RIGHT ANTECUBITAL  Final   Special Requests   Final    BOTTLES DRAWN AEROBIC AND ANAEROBIC Blood Culture adequate volume   Culture   Final    NO GROWTH 4 DAYS Performed at [facility], [address] [place], [place] [address]    Report Status PENDING  Incomplete  Urine Culture     Status: Abnormal   Collection Time: 09/10/19  1:29 AM   Specimen: Urine, Random  Result Value Ref Range Status   Specimen Description   Final    URINE, RANDOM Performed at [facility], [address], [place], [place] [address]    Special Requests   Final    NONE Performed at [facility], [address] [place], [place] [address]    Culture MULTIPLE SPECIES PRESENT, SUGGEST RECOLLECTION (A)  Final   Report Status 09/11/2019 FINAL  Final  Respiratory Panel by RT PCR (Flu A&B, Covid) - Nasopharyngeal Swab     Status: None   Collection Time: 09/10/19  1:29 AM   Specimen: Nasopharyngeal Swab  Result Value Ref Range Status   SARS Coronavirus 2 by RT PCR NEGATIVE NEGATIVE Final    Comment: (NOTE) SARS-CoV-2 target nucleic acids are NOT DETECTED. The SARS-CoV-2 RNA is generally detectable in upper respiratoy specimens during the acute phase of infection. The lowest concentration of SARS-CoV-2 viral copies this assay can detect is 131 copies/mL. A negative result does not preclude SARS-Cov-2 infection and should not be used as the sole basis for treatment or other patient management decisions. A negative  result may occur with  improper specimen collection/handling, submission of specimen other than nasopharyngeal swab, presence of viral mutation(s) within the areas targeted by this assay, and inadequate number of viral copies (<131 copies/mL). A negative result must be combined with clinical observations, patient history, and epidemiological information. The expected result is Negative. Fact Sheet for  Patients:  PinkCheek.be Fact Sheet for Healthcare Providers:  GravelBags.it This test is not yet ap proved or cleared by the [place] and  has been authorized for detection and/or diagnosis of SARS-CoV-2 by FDA under an Emergency Use Authorization (EUA). This EUA will remain  in effect (meaning this test can be used) for the duration of the COVID-19 declaration under Section 564(b)(1) of the Act, 21 U.S.C. section 360bbb-3(b)(1), unless the authorization is terminated or revoked sooner.    Influenza A by PCR NEGATIVE NEGATIVE Final   Influenza B by PCR NEGATIVE NEGATIVE Final    Comment: (NOTE) The Xpert Xpress SARS-CoV-2/FLU/RSV assay is intended as an aid in  the diagnosis of influenza from Nasopharyngeal swab specimens and  should not be used as a sole basis for treatment. Nasal washings and  aspirates are unacceptable for Xpert Xpress SARS-CoV-2/FLU/RSV  testing. Fact Sheet for Patients: PinkCheek.be Fact Sheet for Healthcare Providers: GravelBags.it This test is not yet approved or cleared by the [place] FDA and  has been authorized for detection and/or diagnosis of SARS-CoV-2 by  FDA under an Emergency Use Authorization (EUA). This EUA will remain  in effect (meaning this test can be used) for the duration of the  Covid-19 declaration under Section 564(b)(1) of the Act, 21  U.S.C. section 360bbb-3(b)(1), unless the authorization is  terminated or  revoked. Performed at [facility], [address] [place], [place] [address]     Coagulation Studies: No results for input(s): LABPROT, INR in the last 72 hours.  Urinalysis: No results for input(s): COLORURINE, LABSPEC, PHURINE, GLUCOSEU, HGBUR, BILIRUBINUR, KETONESUR, PROTEINUR, UROBILINOGEN, NITRITE, LEUKOCYTESUR in the last 72 hours.  Invalid input(s): APPERANCEUR    Imaging: No results found.   Medications:   . sodium chloride    . sodium chloride     . amLODipine  5 mg Oral Daily  . carvedilol  6.25 mg Oral BID WC  . Chlorhexidine Gluconate Cloth  6 each Topical Q0600  . clotrimazole  1 Applicatorful Vaginal QHS  . gabapentin  300 mg Oral BID  . heparin  5,000 Units Subcutaneous Q8H  . insulin aspart  0-5 Units Subcutaneous QHS  . insulin aspart  0-6 Units Subcutaneous TID WC  . levothyroxine  25 mcg Oral QAC breakfast  . multivitamin  1 tablet Oral QHS  . pantoprazole  40 mg Oral Daily  . pravastatin  40 mg Oral q1800  . sertraline  25 mg Oral Daily   sodium chloride, sodium chloride, acetaminophen **OR** acetaminophen, alteplase, calcium carbonate (dosed in mg elemental calcium), camphor-menthol **AND** hydrOXYzine, docusate sodium, feeding supplement (NEPRO CARB STEADY), heparin, lidocaine (PF), lidocaine-prilocaine, ondansetron **OR** ondansetron (ZOFRAN) IV, pentafluoroprop-tetrafluoroeth, sorbitol, zolpidem  Assessment/ Plan:   ESRD-recent start to [facility] Monday Wednesday Friday possible treatment Wednesday.  Currently receiving dialysis Monday, 09/14/2019 next dialysis treatment be 09/16/2019  ANEMIA-iron deficiency will prescribe IV iron with dialysis.  MBD-renal osteodystrophy phosphorus 2.9 no need for binders  HTN/VOL-continues to challenge dry weight blood pressure control appears to be adequate with carvedilol and Norvasc.  We will continue to try 128  ACCESS-continue to work with catheter.  Appreciate assistance from Dr. Augustin Coupe, he assessed  current vascular access  Lower extremity weakness.  Will check thyroid studies as patient is taking thyroxine.  I would also recommend discontinuation of statins at this point.  Currently to undergo evaluation with MRI for cauda equina syndrome.  We will also decrease dose of Neurontin to 300 mg daily patient most  likely has diabetic polyneuropathy and will need nerve conduction study performed as an outpatient.    LOS: [place] @TODAY @2 :05 PM

## 2019-09-14 NOTE — Progress Notes (Signed)
In and out cath performed and 600cc of cloudy whitish/yellow urine emptied. Pt tolerated procedure well.   Eleanora Neighbor, RN

## 2019-09-15 ENCOUNTER — Encounter (HOSPITAL_COMMUNITY): Payer: Self-pay | Admitting: Internal Medicine

## 2019-09-15 ENCOUNTER — Inpatient Hospital Stay (HOSPITAL_COMMUNITY): Payer: Medicaid Other

## 2019-09-15 ENCOUNTER — Encounter (HOSPITAL_COMMUNITY)
Admission: EM | Disposition: A | Discharge status: Home / Self Care | Payer: Self-pay | Source: Home / Self Care | Attending: Internal Medicine

## 2019-09-15 ENCOUNTER — Inpatient Hospital Stay (HOSPITAL_COMMUNITY): Payer: Medicaid Other | Admitting: Certified Registered"

## 2019-09-15 DIAGNOSIS — R195 Other fecal abnormalities: Secondary | ICD-10-CM

## 2019-09-15 HISTORY — PX: RADIOLOGY WITH ANESTHESIA: SHX6223

## 2019-09-15 LAB — CULTURE, BLOOD (ROUTINE X 2)
Culture: NO GROWTH
Culture: NO GROWTH
Special Requests: ADEQUATE
Special Requests: ADEQUATE

## 2019-09-15 LAB — CBC WITH DIFFERENTIAL/PLATELET
Abs Immature Granulocytes: 0.03 10*3/uL (ref 0.00–0.07)
Basophils Absolute: 0 10*3/uL (ref 0.0–0.1)
Basophils Relative: 1 %
Eosinophils Absolute: 0.3 10*3/uL (ref 0.0–0.5)
Eosinophils Relative: 6 %
HCT: 28.6 % — ABNORMAL LOW (ref 36.0–46.0)
Hemoglobin: 9 g/dL — ABNORMAL LOW (ref 12.0–15.0)
Immature Granulocytes: 1 %
Lymphocytes Relative: 28 %
Lymphs Abs: 1.4 10*3/uL (ref 0.7–4.0)
MCH: 30.4 pg (ref 26.0–34.0)
MCHC: 31.5 g/dL (ref 30.0–36.0)
MCV: 96.6 fL (ref 80.0–100.0)
Monocytes Absolute: 0.6 10*3/uL (ref 0.1–1.0)
Monocytes Relative: 11 %
Neutro Abs: 2.7 10*3/uL (ref 1.7–7.7)
Neutrophils Relative %: 53 %
Platelets: 147 10*3/uL — ABNORMAL LOW (ref 150–400)
RBC: 2.96 MIL/uL — ABNORMAL LOW (ref 3.87–5.11)
RDW: 15.6 % — ABNORMAL HIGH (ref 11.5–15.5)
WBC: 5.1 10*3/uL (ref 4.0–10.5)
nRBC: 0 % (ref 0.0–0.2)

## 2019-09-15 LAB — GLUCOSE, CAPILLARY
Glucose-Capillary: 125 mg/dL — ABNORMAL HIGH (ref 70–99)
Glucose-Capillary: 109 mg/dL — ABNORMAL HIGH (ref 70–99)
Glucose-Capillary: 99 mg/dL (ref 70–99)
Glucose-Capillary: 245 mg/dL — ABNORMAL HIGH (ref 70–99)
Glucose-Capillary: 419 mg/dL — ABNORMAL HIGH (ref 70–99)
Glucose-Capillary: 401 mg/dL — ABNORMAL HIGH (ref 70–99)

## 2019-09-15 LAB — BASIC METABOLIC PANEL
Anion gap: 12 (ref 5–15)
BUN: 12 mg/dL (ref 6–20)
CO2: 24 mmol/L (ref 22–32)
Calcium: 8.3 mg/dL — ABNORMAL LOW (ref 8.9–10.3)
Chloride: 99 mmol/L (ref 98–111)
Creatinine, Ser: 2.78 mg/dL — ABNORMAL HIGH (ref 0.44–1.00)
GFR calc Af Amer: 22 mL/min — ABNORMAL LOW (ref >60)
GFR calc non Af Amer: 19 mL/min — ABNORMAL LOW (ref >60)
Glucose, Bld: 140 mg/dL — ABNORMAL HIGH (ref 70–99)
Potassium: 4.3 mmol/L (ref 3.5–5.1)
Sodium: 135 mmol/L (ref 135–145)

## 2019-09-15 LAB — C DIFFICILE QUICK SCREEN W PCR REFLEX
C Diff antigen: NEGATIVE
C Diff interpretation: NOT DETECTED
C Diff toxin: NEGATIVE

## 2019-09-15 LAB — TSH: TSH: 4.565 u[IU]/mL — ABNORMAL HIGH (ref 0.350–4.500)

## 2019-09-15 LAB — GLUCOSE, RANDOM: Glucose, Bld: 434 mg/dL — ABNORMAL HIGH (ref 70–99)

## 2019-09-15 LAB — METHYLMALONIC ACID, SERUM: Methylmalonic Acid, Quantitative: 373 nmol/L (ref 0–378)

## 2019-09-15 IMAGING — MR MR THORACIC SPINE W/O CM
4 of 11 series · 18 of 48 positions shown · non-contrast
Comparison: CT lumbar spine [DATE]

CLINICAL DATA: Low back pain. Rule out cauda equina syndrome. Known
L1 fracture.

EXAM:
MRI THORACIC AND LUMBAR SPINE WITHOUT CONTRAST
TECHNIQUE: Multiplanar and multiecho pulse sequences of the thoracic and lumbar
spine were obtained without intravenous contrast.

[Series 4: T2 · sagittal · 3.0mm · 0.66mm/px · 3 of 15 slices shown (1 of 4)]
[im 1/15]
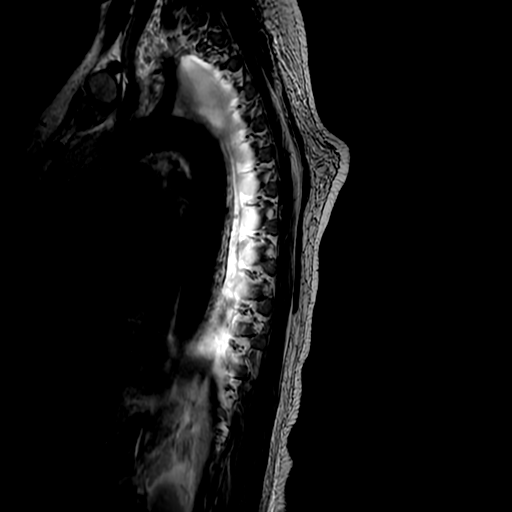
[im 8/15]
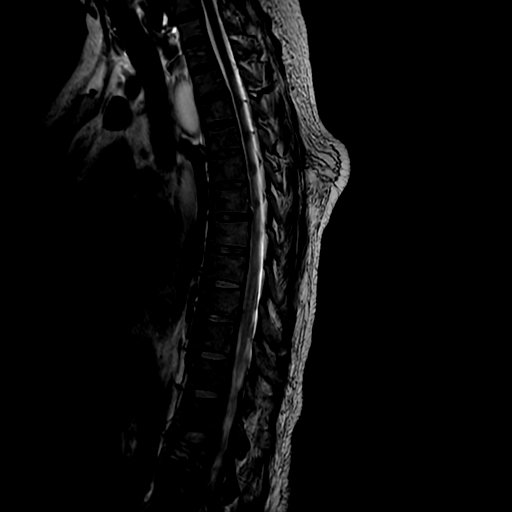
[im 15/15]
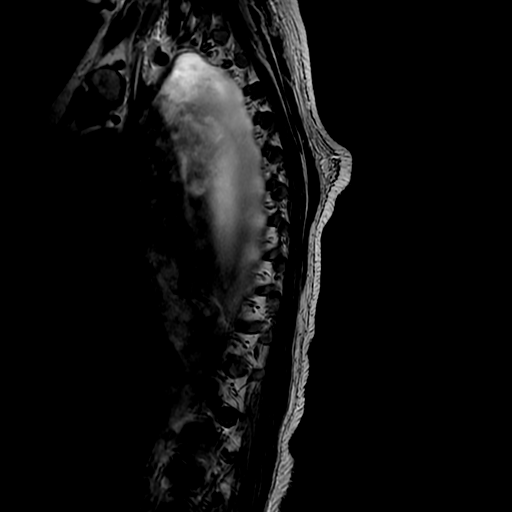

[Series 7: T2 · axial · 4.0mm · 0.39mm/px · z∈[-390,-66]mm · 8 of 60 slices shown (2 of 4)]
[im 1/60]
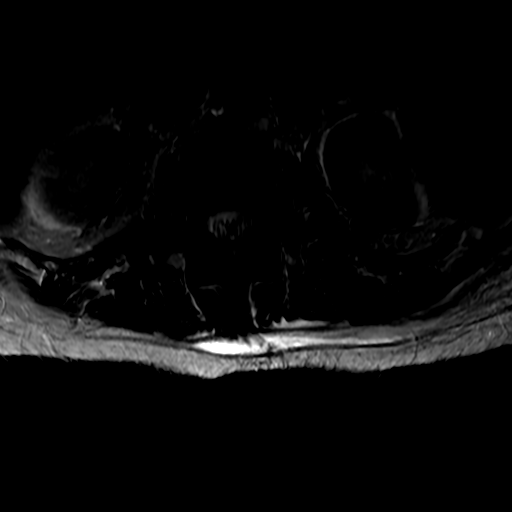
[im 7/60]
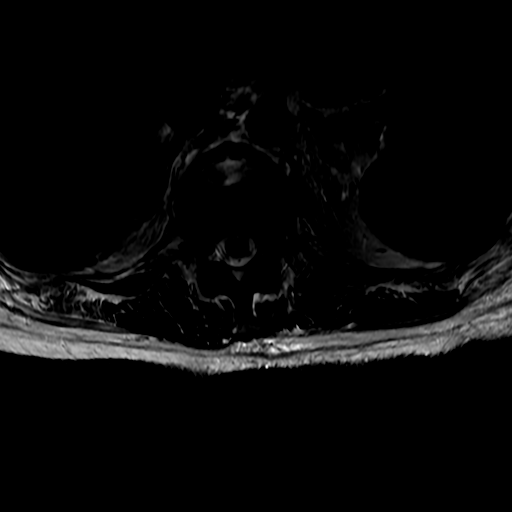
[im 20/60]
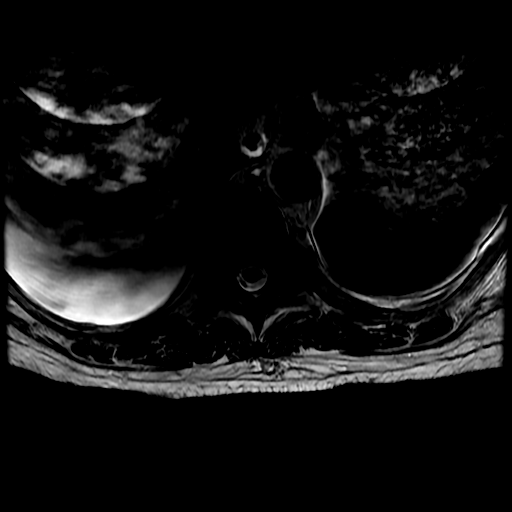
[im 27/60]
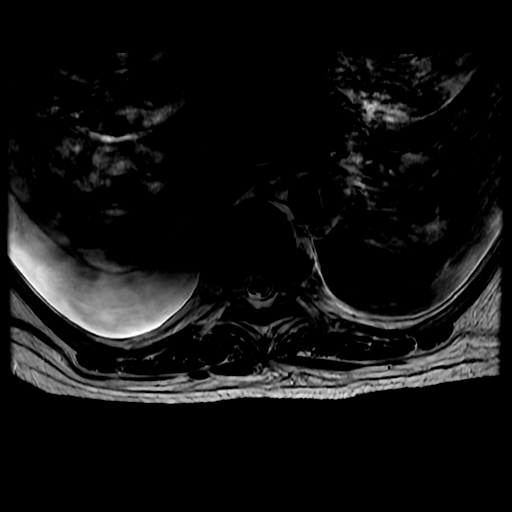
[im 33/60]
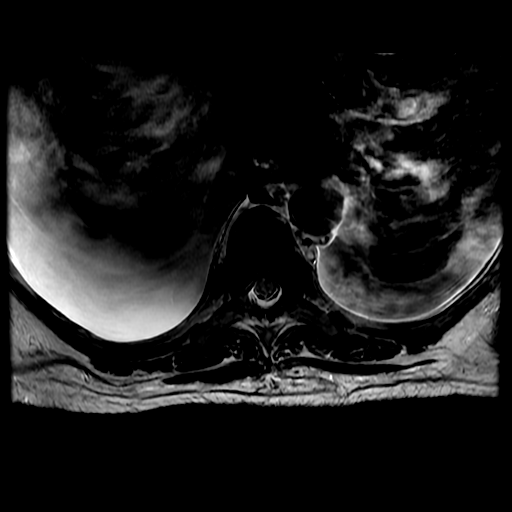
[im 40/60]
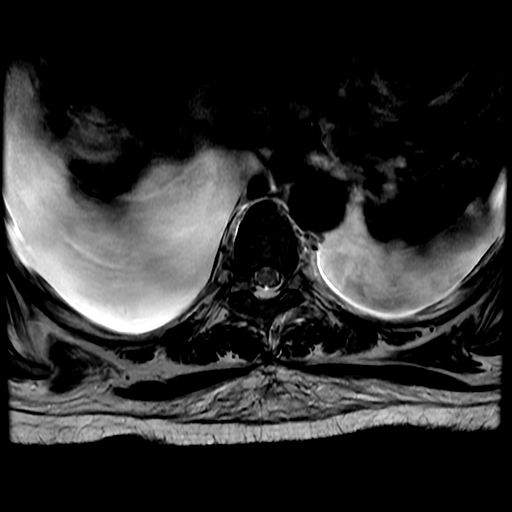
[im 53/60]
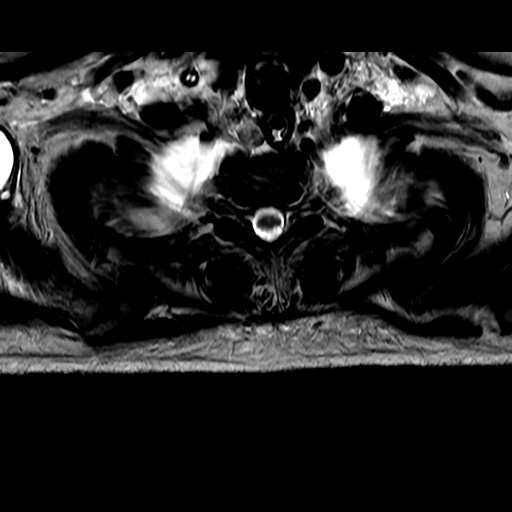
[im 60/60]
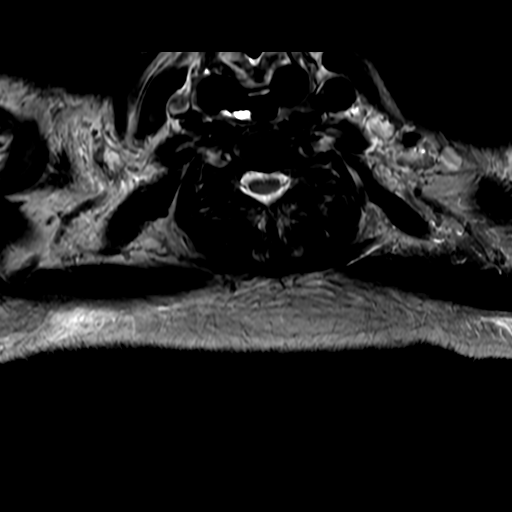

[Series 10: T2 · sagittal · 4.0mm · 0.55mm/px · 2 of 15 slices shown (3 of 4)]
[im 1/15]
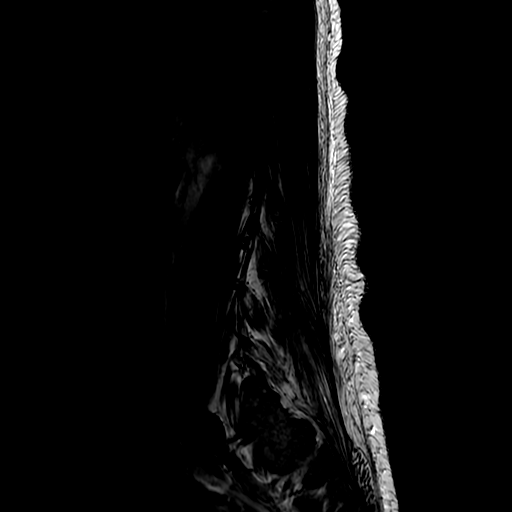
[im 15/15]
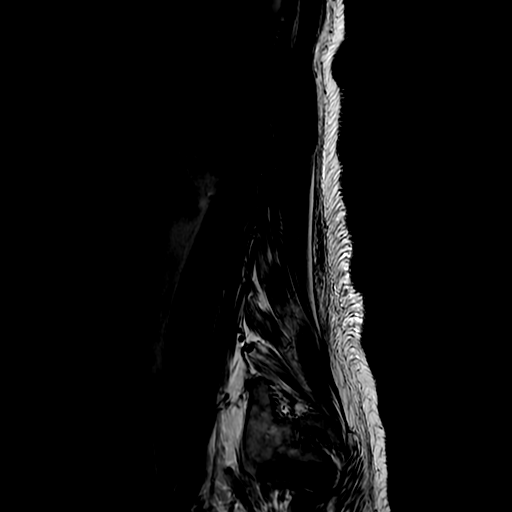

[Series 13: T2 · axial · 4.0mm · 0.39mm/px · z∈[-532,-337]mm · 5 of 40 slices shown (4 of 4)]
[im 1/40]
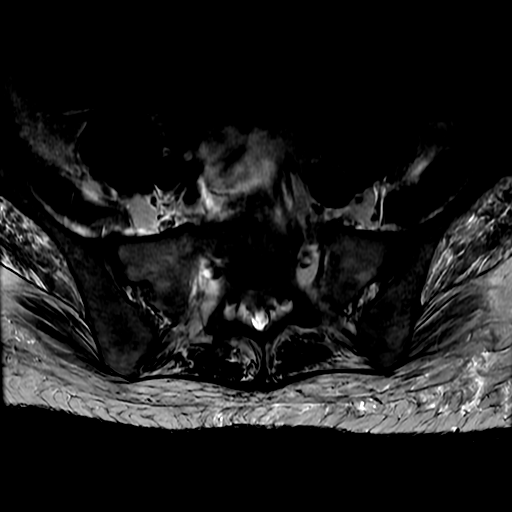
[im 8/40]
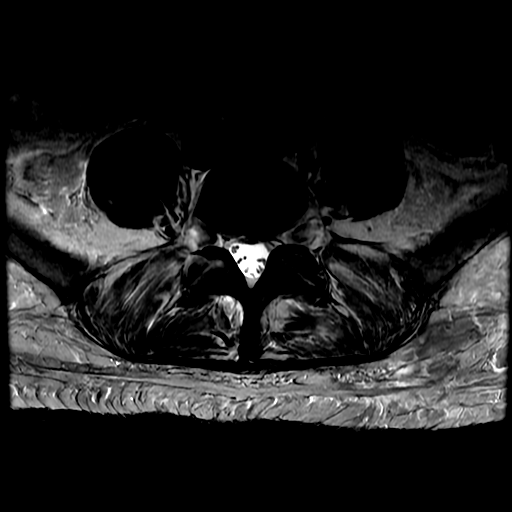
[im 16/40]
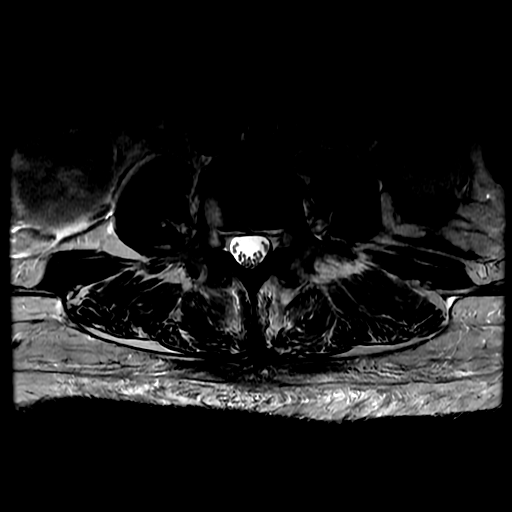
[im 24/40]
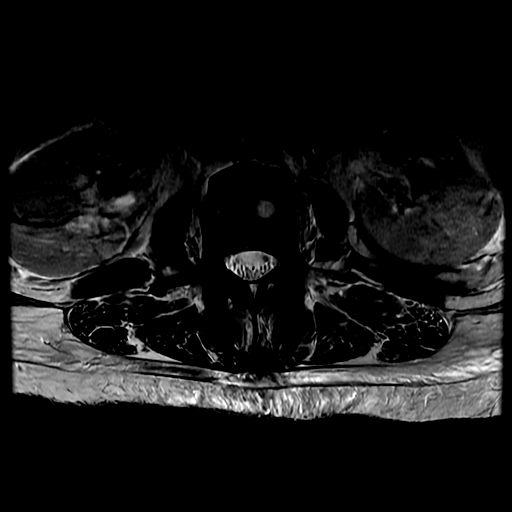
[im 40/40]
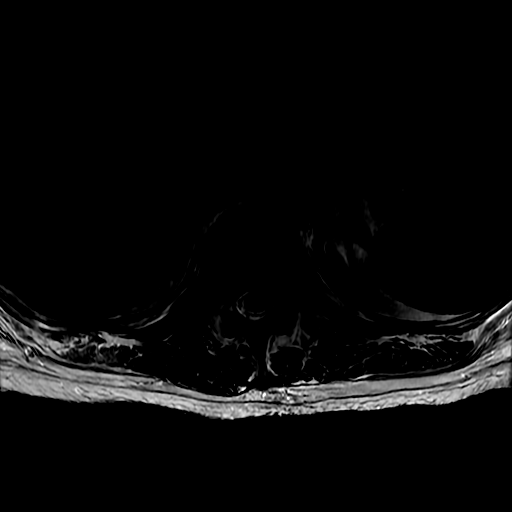

[18 of 48 positions shown; findings below may reference images not displayed]

FINDINGS: MRI THORACIC SPINE FINDINGS

Alignment:  Normal.

Vertebrae: Normal bone marrow. No fracture or mass in the thoracic
spine. L1 fracture described below.

Cord:  Normal cord signal.  No cord lesion or cord compression

Paraspinal and other soft tissues: Large right pleural effusion and
moderate left pleural effusion with compressive atelectasis in both
lungs right greater than left.

Disc levels:

No significant stenosis in the thoracic spine. Small right-sided
disc protrusion T6-7. Mild facet degeneration lower thoracic spine.
No significant foraminal stenosis.

MRI LUMBAR SPINE FINDINGS

Segmentation:  Normal

Alignment:  Normal

Vertebrae: Moderately severe compression fracture of L1 vertebral
body with diffuse bone marrow edema. Fracture is similar to the
prior CT. No retropulsion of bone into the canal and no spinal
stenosis.

Conus medullaris and cauda equina: Conus extends to the L2 level.
Conus and cauda equina appear normal.

Paraspinal and other soft tissues: Mild paraspinous edema around the
L1 compression fracture. No retroperitoneal mass. Small left
perinephric hematoma is improved from the CT of [DATE]. This is
related to an earlier renal biopsy. 8 x 10 mm hemorrhagic cyst or
hematoma in the left lower pole likely the biopsy site.

Disc levels:

T12-L1: Negative

L1-2: Negative

L2-3: Negative

L3-4: Negative

L4-5: Negative

L5-S1: Normal disc space. Mild facet degeneration. Negative for
stenosis.
IMPRESSION: MR THORACIC SPINE IMPRESSION

Negative for thoracic fracture or mass. No significant stenosis in
the thoracic spine.

MR LUMBAR SPINE IMPRESSION

Moderately severe compression fracture L1 is stable from the CT of
[DATE]. No retropulsion of bone into the canal and no spinal
stenosis at this level. No significant spinal stenosis in the
remainder of the lumbar spine

Improving left perinephric hematoma following  renal biopsy.

## 2019-09-15 SURGERY — MRI WITH ANESTHESIA
Anesthesia: General

## 2019-09-15 MED ORDER — LIDOCAINE 2% (20 MG/ML) 5 ML SYRINGE
INTRAMUSCULAR | Status: DC | PRN
  Administered 2019-09-15: 50 mg via INTRAVENOUS

## 2019-09-15 MED ORDER — INSULIN ASPART 100 UNIT/ML ~~LOC~~ SOLN
8.0000 [IU] | Freq: Once | SUBCUTANEOUS | Status: AC
  Administered 2019-09-15: 8 [IU] via SUBCUTANEOUS

## 2019-09-15 MED ORDER — SODIUM CHLORIDE 0.9 % IV SOLN: INTRAVENOUS | Status: DC

## 2019-09-15 MED ORDER — DEXAMETHASONE SODIUM PHOSPHATE 10 MG/ML IJ SOLN
INTRAMUSCULAR | Status: DC | PRN
  Administered 2019-09-15: 10 mg via INTRAVENOUS

## 2019-09-15 MED ORDER — PHENYLEPHRINE 40 MCG/ML (10ML) SYRINGE FOR IV PUSH (FOR BLOOD PRESSURE SUPPORT)
PREFILLED_SYRINGE | INTRAVENOUS | Status: DC | PRN
  Administered 2019-09-15: 120 ug via INTRAVENOUS

## 2019-09-15 MED ORDER — PROPOFOL 10 MG/ML IV BOLUS
INTRAVENOUS | Status: DC | PRN
  Administered 2019-09-15: 150 mg via INTRAVENOUS

## 2019-09-15 MED ORDER — ONDANSETRON HCL 4 MG/2ML IJ SOLN
INTRAMUSCULAR | Status: DC | PRN
  Administered 2019-09-15: 4 mg via INTRAVENOUS

## 2019-09-15 NOTE — Transfer of Care (Signed)
Immediate Anesthesia Transfer of Care Note  Patient: Melanie Hall  Procedure(s) Performed: Marcos Eke AND LUMBER LOWER BACK PAIN (N/A )  Patient Location: PACU  Anesthesia Type:General  Level of Consciousness: drowsy and patient cooperative  Airway & Oxygen Therapy: Patient Spontanous Breathing and Patient connected to nasal cannula oxygen  Post-op Assessment: Report given to RN  Post vital signs: Reviewed and stable  Last Vitals:  Vitals Value Taken Time  BP 144/73 09/15/19 1329  Temp    Pulse 71 09/15/19 1329  Resp 16 09/15/19 1329  SpO2 92 % 09/15/19 1329  Vitals shown include unvalidated device data.  Last Pain:  Vitals:   09/15/19 0950  TempSrc:   PainSc: Asleep      Patients Stated Pain Goal: 0 (43/83/81 8403)  Complications: No apparent anesthesia complications

## 2019-09-15 NOTE — Progress Notes (Signed)
   09/15/19 1100  PT Visit Information  Last PT Received On 09/15/19  Reason Eval/Treat Not Completed Patient at procedure or test/unavailable    Pt in MRI,  Will reattempt as time and pt allow.  Mee Hives, PT MS Acute Rehab Dept. Number: [place]

## 2019-09-15 NOTE — Progress Notes (Signed)
Admit: 09/09/2019 LOS: 5  67F Recent start ESRD on HD presenting with LE weakness  Subjective:  . Seen in PACU after MRI spine with anesthesia .  HD yesterday, 3L UF, tol well, used TDC . No c/o at current time  03/08 0701 - 03/09 0700 In: 360 [P.O.:360] Out: 3300 [Urine:300]  Filed Weights   09/13/19 2038 09/14/19 1335 09/14/19 1710  Weight: 77.3 kg 73.8 kg 70.3 kg    Scheduled Meds: . [MAR Hold] amLODipine  5 mg Oral Daily  . [MAR Hold] carvedilol  6.25 mg Oral BID WC  . [MAR Hold] Chlorhexidine Gluconate Cloth  6 each Topical Q0600  . [MAR Hold] clotrimazole  1 Applicatorful Vaginal QHS  . [MAR Hold] darbepoetin (ARANESP) injection - DIALYSIS  40 mcg Intravenous Q Mon-HD  . [MAR Hold] gabapentin  300 mg Oral QHS  . [MAR Hold] heparin  5,000 Units Subcutaneous Q8H  . [MAR Hold] insulin aspart  0-5 Units Subcutaneous QHS  . [MAR Hold] insulin aspart  0-6 Units Subcutaneous TID WC  . [MAR Hold] levothyroxine  25 mcg Oral QAC breakfast  . [MAR Hold] multivitamin  1 tablet Oral QHS  . [MAR Hold] pantoprazole  40 mg Oral Daily  . [MAR Hold] sertraline  25 mg Oral Daily   Continuous Infusions: . sodium chloride    . [MAR Hold] ferric gluconate (FERRLECIT/NULECIT) IV     PRN Meds:.[MAR Hold] acetaminophen **OR** [MAR Hold] acetaminophen, [MAR Hold] calcium carbonate (dosed in mg elemental calcium), [MAR Hold] camphor-menthol **AND** [MAR Hold] hydrOXYzine, [MAR Hold] docusate sodium, [MAR Hold] feeding supplement (NEPRO CARB STEADY), [MAR Hold] ondansetron **OR** [MAR Hold] ondansetron (ZOFRAN) IV, [MAR Hold] sorbitol, [MAR Hold] zolpidem  Current Labs: reviewed    Physical Exam:  Blood pressure (!) 161/77, pulse 75, temperature 97.8 F (36.6 C), temperature source Oral, resp. rate 18, height 5\' 3"  (1.6 m), weight 70.3 kg, last menstrual period 12/17/2016, SpO2 98 %. Regular, nl s1s2 TDC C/D/I LUE AVF +B/T No sig LEE No rashes/lesions CTAB b/l  A 1. ESRD [facility] [place] MWF 2. Leg Weakness / incontinence, s/p MRI to eval for cauda equina earlier today 3. CKD-BMD, P and Ca at target; no binders 4. ANemia, Hb stable at 9.0; on ESA; on Fe load 5. HTN/Vol: BPs mildl up, CTM for now  P . Cont HD MWF, TDC, next tomorrow: 4, 2-3L UF, 2K, tight heparin, 400/600 . Medication Issues; o Preferred narcotic agents for pain control are hydromorphone, fentanyl, and methadone. Morphine should not be used.  o Baclofen should be avoided o Avoid oral sodium phosphate and magnesium citrate based laxatives / bowel preps    Pearson Grippe MD 09/15/2019, 1:26 PM  Recent Labs  Lab 09/12/19 0349 09/12/19 0349 09/13/19 0434 09/14/19 0434 09/15/19 0344  NA 138   < > 139 138 135  K 3.7   < > 4.5 4.3 4.3  CL 100   < > 101 101 99  CO2 27   < > 27 26 24   GLUCOSE 123*   < > 129* 161* 140*  BUN 10   < > 19 29* 12  CREATININE 2.58*   < > 3.46* 4.68* 2.78*  CALCIUM 8.2*   < > 8.3* 8.5* 8.3*  PHOS 2.9  --  3.8 4.4  --    < > = values in this interval not displayed.   Recent Labs  Lab 09/12/19 0349 09/12/19 0349 09/13/19 0434 09/14/19 0434 09/15/19 0344  WBC 6.4  --   --  5.6 5.1  NEUTROABS 3.8  --   --  3.4 2.7  HGB 8.8*   < > 8.9* 8.6* 9.0*  HCT 28.7*   < > 29.0* 28.2* 28.6*  MCV 98.6  --   --  98.9 96.6  PLT 153  --   --  154 147*   < > = values in this interval not displayed.

## 2019-09-15 NOTE — Anesthesia Procedure Notes (Signed)
Procedure Name: LMA Insertion Date/Time: 09/15/2019 12:10 PM Performed by: Lance Coon, CRNA Pre-anesthesia Checklist: Patient identified, Emergency Drugs available, Suction available, Patient being monitored and Timeout performed Patient Re-evaluated:Patient Re-evaluated prior to induction Oxygen Delivery Method: Circle system utilized Preoxygenation: Pre-oxygenation with 100% oxygen Induction Type: IV induction LMA: LMA with gastric port inserted LMA Size: 4.0 Number of attempts: 1 Placement Confirmation: positive ETCO2 and breath sounds checked- equal and bilateral Tube secured with: Tape Dental Injury: Teeth and Oropharynx as per pre-operative assessment

## 2019-09-15 NOTE — Progress Notes (Signed)
OT Cancellation Note  Patient Details Name: Melanie Hall MRN: [id number] DOB: October 05, 1970   Cancelled Treatment:    Reason Eval/Treat Not Completed: Patient at procedure or test/ unavailable. Pt off unit for MRI, will check back next available/appropriate time  Britt Bottom 09/15/2019, 11:21 AM

## 2019-09-15 NOTE — Progress Notes (Signed)
Physical Therapy Treatment Patient Details Name: Melanie Hall MRN: [id number] DOB: [birth year] Today's Date: 09/15/2019    History of Present Illness 49 year old female with longstanding history of diabetes mellitus, end-stage renal disease on hemodialysis, hypertension presented to [facility] emergency department with 4 weeks history of progressive bilateral lower extremity weakness, numbness, bowel and bladder incontinence.    PT Comments    Pt was seen for mobility but agreed to do there ex after being tired from MRI and not having eaten lunch yet.  Will reattempt gait on next session as her MRI was negative for cauda equina, and has no GI infection.  Follow acutely for these needs.   Follow Up Recommendations  SNF     Equipment Recommendations  None recommended by PT    Recommendations for Other Services       Precautions / Restrictions Precautions Precautions: Fall Precaution Comments: monitor for need to use two therapists Restrictions Weight Bearing Restrictions: No    Mobility  Bed Mobility Overal bed mobility: Needs Assistance             General bed mobility comments: declines OOB  Transfers                    Ambulation/Gait                 Stairs             Wheelchair Mobility    Modified Rankin (Stroke Patients Only)       Balance                                            Cognition Arousal/Alertness: Awake/alert Behavior During Therapy: WFL for tasks assessed/performed Overall Cognitive Status: Within Functional Limits for tasks assessed                                 General Comments: talking about wanting to be active      Exercises General Exercises - Lower Extremity Ankle Circles/Pumps: AROM;5 reps Quad Sets: AROM;10 reps Heel Slides: AROM;10 reps Hip ABduction/ADduction: AROM;10 reps Straight Leg Raises: AAROM;10 reps Hip Flexion/Marching: AROM;10 reps    General  Comments        Pertinent Vitals/Pain Pain Assessment: No/denies pain    Home Living                      Prior Function            PT Goals (current goals can now be found in the care plan section) Acute Rehab PT Goals Patient Stated Goal: to get home and feel better    Frequency    Min 3X/week      PT Plan Current plan remains appropriate    Co-evaluation              AM-PAC PT "6 Clicks" Mobility   Outcome Measure  Help needed turning from your back to your side while in a flat bed without using bedrails?: None Help needed moving from lying on your back to sitting on the side of a flat bed without using bedrails?: A Little Help needed moving to and from a bed to a chair (including a wheelchair)?: A Little Help needed standing up from a chair using your arms (e.g., wheelchair  or bedside chair)?: A Lot Help needed to walk in hospital room?: A Lot Help needed climbing 3-5 steps with a railing? : Total 6 Click Score: 15    End of Session   Activity Tolerance: Patient limited by fatigue Patient left: in bed;with call bell/phone within reach;with bed alarm set Nurse Communication: Mobility status PT Visit Diagnosis: Unsteadiness on feet (R26.81);Muscle weakness (generalized) (M62.81)     Time: 5498-2641 PT Time Calculation (min) (ACUTE ONLY): 21 min  Charges:  $Therapeutic Exercise: 8-22 mins                Ramond Dial 09/15/2019, 3:46 PM  Mee Hives, PT MS Acute Rehab Dept. Number: [place] [facility] [place]

## 2019-09-15 NOTE — Plan of Care (Signed)
  Problem: Nutrition: Goal: Adequate nutrition will be maintained Outcome: Progressing   

## 2019-09-15 NOTE — Anesthesia Preprocedure Evaluation (Addendum)
Anesthesia Evaluation  Patient identified by MRN, date of birth, ID band Patient awake    Reviewed: Allergy & Precautions, NPO status , Patient's Chart, lab work & pertinent test results  Airway Mallampati: II  TM Distance: >3 FB Neck ROM: Full    Dental no notable dental hx. (+) Teeth Intact, Dental Advisory Given   Pulmonary neg pulmonary ROS, former smoker,    Pulmonary exam normal breath sounds clear to auscultation       Cardiovascular hypertension, Pt. on medications Normal cardiovascular exam Rhythm:Regular Rate:Normal  08/14/19 Echo Left ventricular ejection fraction, by visual estimation, is 55 to 60%. The left ventricle has normal function. There is moderately increased left ventricular hypertrophy.   Neuro/Psych negative neurological ROS  negative psych ROS   GI/Hepatic GERD  Medicated,  Endo/Other  diabetes, Type 2Morbid obesity  Renal/GU Dialysis and ESRFRenal disease     Musculoskeletal   Abdominal   Peds  Hematology  (+) anemia , Hgb 9.0   Anesthesia Other Findings   Reproductive/Obstetrics                            Anesthesia Physical Anesthesia Plan  ASA: III  Anesthesia Plan: General   Post-op Pain Management:    Induction: Intravenous  PONV Risk Score and Plan: 4 or greater and Treatment may vary due to age or medical condition, Ondansetron, Dexamethasone and Midazolam  Airway Management Planned: LMA  Additional Equipment:   Intra-op Plan:   Post-operative Plan:   Informed Consent: I have reviewed the patients History and Physical, chart, labs and discussed the procedure including the risks, benefits and alternatives for the proposed anesthesia with the patient or authorized representative who has indicated his/her understanding and acceptance.     Dental advisory given  Plan Discussed with: CRNA  Anesthesia Plan Comments:         Anesthesia  Quick Evaluation

## 2019-09-15 NOTE — Progress Notes (Signed)
PROGRESS NOTE    MIKALA Hall  CMK:[id number] DOB: 12/19/1970 DOA: 09/09/2019 PCP: [facility] [place]    Brief Narrative:  HPI per Dr. Timmothy Euler Melanie Hall is a 49 y.o. female with medical history significant of HTN; DM; and ESRD recently started on HD presenting with weakness.  She is not able to walk.  This has been going on for 4 weeks.  She is not certain why this is happened.  She started HD 4 weeks ago and the inability to walk started at the same time.  Her R arm is also weak.  She is having numbness and tingling of her arms and legs.  +urinary and fecal incontinence for the same length of time.  Nothing changed yesterday to make her come to the ER.  She has a mild headache.  No visual disturbance.  No dysphagia.  The patient appeared frustrated and reluctant to provide history.  I spoke with her daughter, who brought her to the ER.  She has been been out of the hospital for 2-3 weeks.  During the time she was hospitalized, she only got up once to walk and has been unable to walk since.  [facility] has been helping at home, and they raised concerns about her inability to walk.  She is not taking medications, no insulin since discharge.  Her daughter is taking her to HD qMWF and otherwise only wants fast food, doesn't want to eat right.  She has a couple of bedsores.  Her daughter this is physical and psychological.  She had prior h/o leg surgery and refused therapy and mostly stopped moving around and her health declined accordingly.  She walked with a walker until a couple of months ago, but never really walked that much after her leg surgery.  Yesterday, while her daughter talked to the triage nurse, her mother stood with the triage nurse but she won't do that at home.  She voids/stools at home and will not attempt to get out of bed to use the bedside commode.  Her daughter thinks she needs SNF rehab, but the patient has refused that.   They are thinking APS may need to be involved.  Her  sister moved in to her house 2 days ago and her mother didn't like it that she was making her move too much.  There is someone bringing her fast food and her mother refuses to eat healthy foods. At this point, "we tried it, we tried to let her be at home..." but it isn't working.  Strong FH of mental health disorders; her 2nd husband was killed; daughter agrees with psychiatry evaluation.   ED Course: Has UTI and needs MRI of spine.  Showed up at [facility], concern for cauda equina, unable to get MRI effectively.  Likely needs anesthesia for MRI.  Acute vs. Chronic weakness, urinary incontinence.  Teleneuro recommended MRI while at [facility].   Assessment & Plan:   Principal Problem:   Debility Active Problems:   Type 2 diabetes with nephropathy ([place])   HYPERCHOLESTEROLEMIA   Hypertension associated with diabetes ([place])   ESRD (end stage renal disease) on dialysis ([place])   Depression   Incontinence in female   Pressure ulcer   Acute cystitis without hematuria   Weakness   Weakness of both lower extremities  1 debility with fecal/urinary incontinence Questionable etiology.  Concern for cauda equina syndrome. Patient noted to have remote history of leg surgery(left femur fracture April 2020) and since then has not been  very mobile at that time.  Patient noted to have been recently hospitalized from January 30-February 16 and started on hemodialysis and since then and really getting out of bed.  Patient states feels extremely weak after hemodialysis sessions.  Per admitting physician patient noted to have absent reflexes felt secondary to diabetic polyneuropathy.  Patient with bowel and bladder incontinence concerning for cauda equina syndrome however.  Admitting MD it was felt patient's incontinence could also be more associated with patient being unable/unwilling to get out of bed for toileting.  MRI C-spine with no acute abnormalities noted.  It was noted patient unable to tolerate MRI even with  anxiolytics and as such further MRI necessary may need to be under general anesthesia. RBC folate at 1312, CRP at 5, vitamin B12 at 818, lactic acid level at 0.8, sed rate at 45, TSH at 3.242.  MRI T-spine and L-spine negative for thoracic fracture of mass, no significant stenosis in thoracic spine.  MRI L-spine with moderately severe compression fracture L1 is stable from the CT of 08/23/2019.  No retropulsion of bone into the canal and no spinal stenosis at this level.  No significant spinal stenosis in the remainder of the L-spine.  Improving left perinephric hematoma following renal biopsy.   Neurontin increased to 300 mg twice daily per neurology recommendations.  PT/OT.  Per neurology if MRI negative will need outpatient EMG/nerve conduction study with neurology.  It was noted per x-ray that due to patient's renal function MRI needed to be changed to without contrast.  Neurology following.  2.  End-stage renal disease MWF Patient recently started on hemodialysis at the [facility] Monday Wednesday Friday.  Patient noted to have had a full treatment on Wednesday.  Patient seen in consultation by nephrology and patient underwent hemodialysis on 09/11/2019.  Per nephrology.   3.  Anemia of chronic disease Anemia panel from 08/09/2019 with iron of 22, TIBC of 203, transferrin of 145, folate of 1440.  Hemoglobin currently 9 this morning.  Per nephrology.  4.  Depression Concerned that patient's symptoms may have a psychogenic component in place.  Psychiatry consultation obtained and was felt patient did not meet inpatient criteria and recommended social work follow-up with daughter due to voice concerns.  Patient started on Zoloft per psych recommendations.  5.  Hypothyroidism Continue Synthroid.   6.  Hypertension Norvasc and Coreg.  Patient on hemodialysis.  Continue Norvasc, Coreg.  7.  Hyperlipidemia Statin.   8.  Diabetes mellitus type 2 Hemoglobin A1c of 7.2.  CBG of 125 this  morning.  Continue sliding scale insulin.  9. Vaginal candidiasis Status post Diflucan 150 mg p.o. x1.  Continue clotrimazole cream nightly.  10.  Loose stools Patient had presented with bladder and bowel incontinence.  MRI under anesthesia of T and L-spine done negative full cord compression.  C. difficile PCR negative.  GI pathogen panel pending.  Improved.  Follow.    DVT prophylaxis: Heparin Code Status: Full Family Communication: Updated patient.  No family at bedside. Disposition Plan:  . Patient came from: Home            . Anticipated d/c place: Likely skilled nursing facility . Barriers to d/c OR conditions which need to be met to effect a safe d/c: Likely skilled nursing facility once cleared by neurology, nephrology and evaluation by PT/OT when MRI resulted.   Consultants:   Telemetry neurology: Dr. Roanna Raider 09/10/2019  Neuro hospitalist: Dr. Lorraine Lax 09/10/2019  Psychiatry: Dr. Dwyane Dee 09/11/2019  Nephrology: Dr. Augustin Coupe 09/10/2019  Procedures:   MRI T-spine and L-spine 09/15/2019  CT head 09/10/2019  Chest x-ray 09/10/2019  MRI C-spine 09/10/2019  Antimicrobials:   IV Rocephin 09/10/2019>>> 09/11/2019   Subjective: Patient just returning from MRI.  Denies chest pain or shortness of breath.  Denies any further diarrhea.   Objective: Vitals:   09/15/19 1330 09/15/19 1344 09/15/19 1345 09/15/19 1356  BP: (!) 144/73 (!) 158/74 (!) 158/74   Pulse: 71 71 72 70  Resp: '16 20 20 20  '$ Temp:      TempSrc:      SpO2: 92% 95% 95% 96%  Weight:      Height:        Intake/Output Summary (Last 24 hours) at 09/15/2019 1435 Last data filed at 09/15/2019 1312 Gross per 24 hour  Intake 590 ml  Output 3300 ml  Net -2710 ml   Filed Weights   09/13/19 2038 09/14/19 1335 09/14/19 1710  Weight: 77.3 kg 73.8 kg 70.3 kg    Examination:  General exam: NAD Respiratory system: Clear to auscultation bilaterally.  No wheezes, no crackles, no rhonchi.  Normal respiratory effort.  Cardiovascular  system: RRR no murmurs rubs or gallops.  No JVD.  No lower extremity edema.   Gastrointestinal system: Abdomen is nontender, nondistended, soft, positive bowel sounds.  No rebound.  No guarding.    Central nervous system: Alert and oriented. No focal neurological deficits.  Moving extremities spontaneously. Genitourinary: Vaginal candidiasis. Extremities: Symmetric 5 x 5 power. Skin: No rashes, lesions or ulcers Psychiatry: Judgement and insight appear normal. Mood & affect appropriate.     Data Reviewed: I have personally reviewed following labs and imaging studies  CBC: Recent Labs  Lab 09/10/19 0021 09/10/19 1820 09/11/19 0626 09/12/19 0349 09/13/19 0434 09/14/19 0434 09/15/19 0344  WBC 9.4  --  6.7 6.4  --  5.6 5.1  NEUTROABS 6.7  --   --  3.8  --  3.4 2.7  HGB 10.0*  --  9.7* 8.8* 8.9* 8.6* 9.0*  HCT 32.3*   < > 32.1* 28.7* 29.0* 28.2* 28.6*  MCV 100.6*  --  101.9* 98.6  --  98.9 96.6  PLT 171  --  148* 153  --  154 147*   < > = values in this interval not displayed.   Basic Metabolic Panel: Recent Labs  Lab 09/11/19 0626 09/12/19 0349 09/13/19 0434 09/14/19 0434 09/15/19 0344  NA 141 138 139 138 135  K 4.1 3.7 4.5 4.3 4.3  CL 101 100 101 101 99  CO2 '25 27 27 26 24  '$ GLUCOSE 150* 123* 129* 161* 140*  BUN '19 10 19 '$ 29* 12  CREATININE 3.62* 2.58* 3.46* 4.68* 2.78*  CALCIUM 8.7* 8.2* 8.3* 8.5* 8.3*  PHOS 4.0 2.9 3.8 4.4  --    GFR: Estimated Creatinine Clearance: 23 mL/min (A) (by C-G formula based on SCr of 2.78 mg/dL (H)). Liver Function Tests: Recent Labs  Lab 09/10/19 0021 09/12/19 0349 09/13/19 0434 09/14/19 0434  AST 15  --   --   --   ALT 17  --   --   --   ALKPHOS 154*  --   --   --   BILITOT 1.2  --   --   --   PROT 6.6  --   --   --   ALBUMIN 2.9* 2.5* 2.6* 2.6*   No results for input(s): LIPASE, AMYLASE in the last 168 hours. No results for input(s): AMMONIA  in the last 168 hours. Coagulation Profile: No results for input(s): INR, PROTIME  in the last 168 hours. Cardiac Enzymes: No results for input(s): CKTOTAL, CKMB, CKMBINDEX, TROPONINI in the last 168 hours. BNP (last 3 results) No results for input(s): PROBNP in the last 8760 hours. HbA1C: No results for input(s): HGBA1C in the last 72 hours. CBG: Recent Labs  Lab 09/14/19 1800 09/14/19 2132 09/15/19 0717 09/15/19 1135 09/15/19 1327  GLUCAP 109* 155* 125* 109* 99   Lipid Profile: No results for input(s): CHOL, HDL, LDLCALC, TRIG, CHOLHDL, LDLDIRECT in the last 72 hours. Thyroid Function Tests: Recent Labs    09/15/19 0344  TSH 4.565*   Anemia Panel: No results for input(s): VITAMINB12, FOLATE, FERRITIN, TIBC, IRON, RETICCTPCT in the last 72 hours. Sepsis Labs: Recent Labs  Lab 09/10/19 0021  LATICACIDVEN 0.8    Recent Results (from the past 240 hour(s))  Blood culture (routine x 2)     Status: None   Collection Time: 09/10/19 12:22 AM   Specimen: BLOOD  Result Value Ref Range Status   Specimen Description BLOOD BLOOD RIGHT WRIST  Final   Special Requests   Final    BOTTLES DRAWN AEROBIC AND ANAEROBIC Blood Culture adequate volume   Culture   Final    NO GROWTH 5 DAYS Performed at [facility], [address], [place], [place] [address]    Report Status 09/15/2019 FINAL  Final  Blood culture (routine x 2)     Status: None   Collection Time: 09/10/19 12:28 AM   Specimen: BLOOD  Result Value Ref Range Status   Specimen Description BLOOD RIGHT ANTECUBITAL  Final   Special Requests   Final    BOTTLES DRAWN AEROBIC AND ANAEROBIC Blood Culture adequate volume   Culture   Final    NO GROWTH 5 DAYS Performed at [facility], [address], [place], [place] [address]    Report Status 09/15/2019 FINAL  Final  Urine Culture     Status: Abnormal   Collection Time: 09/10/19  1:29 AM   Specimen: Urine, Random  Result Value Ref Range Status   Specimen Description   Final    URINE, RANDOM Performed at [facility], [address] [place], [place]  [address]    Special Requests   Final    NONE Performed at [facility], [address] [place], [place] [address]    Culture MULTIPLE SPECIES PRESENT, SUGGEST RECOLLECTION (A)  Final   Report Status 09/11/2019 FINAL  Final  Respiratory Panel by RT PCR (Flu A&B, Covid) - Nasopharyngeal Swab     Status: None   Collection Time: 09/10/19  1:29 AM   Specimen: Nasopharyngeal Swab  Result Value Ref Range Status   SARS Coronavirus 2 by RT PCR NEGATIVE NEGATIVE Final    Comment: (NOTE) SARS-CoV-2 target nucleic acids are NOT DETECTED. The SARS-CoV-2 RNA is generally detectable in upper respiratoy specimens during the acute phase of infection. The lowest concentration of SARS-CoV-2 viral copies this assay can detect is 131 copies/mL. A negative result does not preclude SARS-Cov-2 infection and should not be used as the sole basis for treatment or other patient management decisions. A negative result may occur with  improper specimen collection/handling, submission of specimen other than nasopharyngeal swab, presence of viral mutation(s) within the areas targeted by this assay, and inadequate number of viral copies (<131 copies/mL). A negative result must be combined with clinical observations, patient history, and epidemiological information. The expected result is Negative. Fact Sheet  for Patients:  PinkCheek.be Fact Sheet for Healthcare Providers:  GravelBags.it This test is not yet ap proved or cleared by the [place] FDA and  has been authorized for detection and/or diagnosis of SARS-CoV-2 by FDA under an Emergency Use Authorization (EUA). This EUA will remain  in effect (meaning this test can be used) for the duration of the COVID-19 declaration under Section 564(b)(1) of the Act, 21 U.S.C. section 360bbb-3(b)(1), unless the authorization is terminated or revoked sooner.    Influenza A by PCR NEGATIVE NEGATIVE Final    Influenza B by PCR NEGATIVE NEGATIVE Final    Comment: (NOTE) The Xpert Xpress SARS-CoV-2/FLU/RSV assay is intended as an aid in  the diagnosis of influenza from Nasopharyngeal swab specimens and  should not be used as a sole basis for treatment. Nasal washings and  aspirates are unacceptable for Xpert Xpress SARS-CoV-2/FLU/RSV  testing. Fact Sheet for Patients: PinkCheek.be Fact Sheet for Healthcare Providers: GravelBags.it This test is not yet approved or cleared by the [place] FDA and  has been authorized for detection and/or diagnosis of SARS-CoV-2 by  FDA under an Emergency Use Authorization (EUA). This EUA will remain  in effect (meaning this test can be used) for the duration of the  Covid-19 declaration under Section 564(b)(1) of the Act, 21  U.S.C. section 360bbb-3(b)(1), unless the authorization is  terminated or revoked. Performed at [facility], [address] [place], [place] [address]   C difficile quick scan w PCR reflex     Status: None   Collection Time: 09/15/19  1:56 AM   Specimen: STOOL  Result Value Ref Range Status   C Diff antigen NEGATIVE NEGATIVE Final   C Diff toxin NEGATIVE NEGATIVE Final   C Diff interpretation No C. difficile detected.  Final    Comment: Performed at [facility], [place] [address], [place], [place] [address]         Radiology Studies: MR THORACIC SPINE WO CONTRAST  Result Date: 09/15/2019 CLINICAL DATA:  Low back pain. Rule out cauda equina syndrome. Known L1 fracture. EXAM: MRI THORACIC AND LUMBAR SPINE WITHOUT CONTRAST TECHNIQUE: Multiplanar and multiecho pulse sequences of the thoracic and lumbar spine were obtained without intravenous contrast. COMPARISON:  CT lumbar spine 08/23/2019 FINDINGS: MRI THORACIC SPINE FINDINGS Alignment:  Normal. Vertebrae: Normal bone marrow. No fracture or mass in the thoracic spine. L1 fracture described below. Cord:  Normal cord  signal.  No cord lesion or cord compression Paraspinal and other soft tissues: Large right pleural effusion and moderate left pleural effusion with compressive atelectasis in both lungs right greater than left. Disc levels: No significant stenosis in the thoracic spine. Small right-sided disc protrusion T6-7. Mild facet degeneration lower thoracic spine. No significant foraminal stenosis. MRI LUMBAR SPINE FINDINGS Segmentation:  Normal Alignment:  Normal Vertebrae: Moderately severe compression fracture of L1 vertebral body with diffuse bone marrow edema. Fracture is similar to the prior CT. No retropulsion of bone into the canal and no spinal stenosis. Conus medullaris and cauda equina: Conus extends to the L2 level. Conus and cauda equina appear normal. Paraspinal and other soft tissues: Mild paraspinous edema around the L1 compression fracture. No retroperitoneal mass. Small left perinephric hematoma is improved from the CT of 08/13/2019. This is related to an earlier renal biopsy. 8 x 10 mm hemorrhagic cyst or hematoma in the left lower pole likely the biopsy site. Disc levels: T12-L1: Negative L1-2: Negative L2-3: Negative L3-4: Negative L4-5: Negative L5-S1: Normal disc space.  Mild facet degeneration. Negative for stenosis. IMPRESSION: MR THORACIC SPINE IMPRESSION Negative for thoracic fracture or mass. No significant stenosis in the thoracic spine. MR LUMBAR SPINE IMPRESSION Moderately severe compression fracture L1 is stable from the CT of 08/23/2019. No retropulsion of bone into the canal and no spinal stenosis at this level. No significant spinal stenosis in the remainder of the lumbar spine Improving left perinephric hematoma following  renal biopsy. Electronically Signed   By: Franchot Gallo M.D.   On: 09/15/2019 14:05   MR LUMBAR SPINE WO CONTRAST  Result Date: 09/15/2019 CLINICAL DATA:  Low back pain. Rule out cauda equina syndrome. Known L1 fracture. EXAM: MRI THORACIC AND LUMBAR SPINE WITHOUT  CONTRAST TECHNIQUE: Multiplanar and multiecho pulse sequences of the thoracic and lumbar spine were obtained without intravenous contrast. COMPARISON:  CT lumbar spine 08/23/2019 FINDINGS: MRI THORACIC SPINE FINDINGS Alignment:  Normal. Vertebrae: Normal bone marrow. No fracture or mass in the thoracic spine. L1 fracture described below. Cord:  Normal cord signal.  No cord lesion or cord compression Paraspinal and other soft tissues: Large right pleural effusion and moderate left pleural effusion with compressive atelectasis in both lungs right greater than left. Disc levels: No significant stenosis in the thoracic spine. Small right-sided disc protrusion T6-7. Mild facet degeneration lower thoracic spine. No significant foraminal stenosis. MRI LUMBAR SPINE FINDINGS Segmentation:  Normal Alignment:  Normal Vertebrae: Moderately severe compression fracture of L1 vertebral body with diffuse bone marrow edema. Fracture is similar to the prior CT. No retropulsion of bone into the canal and no spinal stenosis. Conus medullaris and cauda equina: Conus extends to the L2 level. Conus and cauda equina appear normal. Paraspinal and other soft tissues: Mild paraspinous edema around the L1 compression fracture. No retroperitoneal mass. Small left perinephric hematoma is improved from the CT of 08/13/2019. This is related to an earlier renal biopsy. 8 x 10 mm hemorrhagic cyst or hematoma in the left lower pole likely the biopsy site. Disc levels: T12-L1: Negative L1-2: Negative L2-3: Negative L3-4: Negative L4-5: Negative L5-S1: Normal disc space. Mild facet degeneration. Negative for stenosis. IMPRESSION: MR THORACIC SPINE IMPRESSION Negative for thoracic fracture or mass. No significant stenosis in the thoracic spine. MR LUMBAR SPINE IMPRESSION Moderately severe compression fracture L1 is stable from the CT of 08/23/2019. No retropulsion of bone into the canal and no spinal stenosis at this level. No significant spinal  stenosis in the remainder of the lumbar spine Improving left perinephric hematoma following  renal biopsy. Electronically Signed   By: Franchot Gallo M.D.   On: 09/15/2019 14:05        Scheduled Meds: . amLODipine  5 mg Oral Daily  . carvedilol  6.25 mg Oral BID WC  . Chlorhexidine Gluconate Cloth  6 each Topical Q0600  . clotrimazole  1 Applicatorful Vaginal QHS  . darbepoetin (ARANESP) injection - DIALYSIS  40 mcg Intravenous Q Mon-HD  . gabapentin  300 mg Oral QHS  . heparin  5,000 Units Subcutaneous Q8H  . insulin aspart  0-5 Units Subcutaneous QHS  . insulin aspart  0-6 Units Subcutaneous TID WC  . levothyroxine  25 mcg Oral QAC breakfast  . multivitamin  1 tablet Oral QHS  . pantoprazole  40 mg Oral Daily  . sertraline  25 mg Oral Daily   Continuous Infusions: . sodium chloride    . [START ON 09/16/2019] ferric gluconate (FERRLECIT/NULECIT) IV       LOS: 5 days    Time spent: 35  minutes    Irine Seal, MD [facility]   To contact the attending provider between 7A-7P or the covering provider during after hours 7P-7A, please log into the web site [email or web address] and access using universal [place] password for that web site. If you do not have the password, please call the hospital operator.  09/15/2019, 2:35 PM

## 2019-09-16 ENCOUNTER — Encounter: Payer: Self-pay | Admitting: *Deleted

## 2019-09-16 DIAGNOSIS — R5381 Other malaise: Secondary | ICD-10-CM

## 2019-09-16 LAB — CBC
HCT: 30.9 % — ABNORMAL LOW (ref 36.0–46.0)
Hemoglobin: 9.8 g/dL — ABNORMAL LOW (ref 12.0–15.0)
MCH: 30.8 pg (ref 26.0–34.0)
MCHC: 31.7 g/dL (ref 30.0–36.0)
MCV: 97.2 fL (ref 80.0–100.0)
Platelets: 163 10*3/uL (ref 150–400)
RBC: 3.18 MIL/uL — ABNORMAL LOW (ref 3.87–5.11)
RDW: 15.4 % (ref 11.5–15.5)
WBC: 5.5 10*3/uL (ref 4.0–10.5)
nRBC: 0 % (ref 0.0–0.2)

## 2019-09-16 LAB — GLUCOSE, CAPILLARY
Glucose-Capillary: 158 mg/dL — ABNORMAL HIGH (ref 70–99)
Glucose-Capillary: 220 mg/dL — ABNORMAL HIGH (ref 70–99)
Glucose-Capillary: 246 mg/dL — ABNORMAL HIGH (ref 70–99)
Glucose-Capillary: 327 mg/dL — ABNORMAL HIGH (ref 70–99)
Glucose-Capillary: 435 mg/dL — ABNORMAL HIGH (ref 70–99)

## 2019-09-16 LAB — BASIC METABOLIC PANEL
Anion gap: 14 (ref 5–15)
BUN: 28 mg/dL — ABNORMAL HIGH (ref 6–20)
CO2: 21 mmol/L — ABNORMAL LOW (ref 22–32)
Calcium: 8.6 mg/dL — ABNORMAL LOW (ref 8.9–10.3)
Chloride: 98 mmol/L (ref 98–111)
Creatinine, Ser: 3.93 mg/dL — ABNORMAL HIGH (ref 0.44–1.00)
GFR calc Af Amer: 15 mL/min — ABNORMAL LOW (ref >60)
GFR calc non Af Amer: 13 mL/min — ABNORMAL LOW (ref >60)
Glucose, Bld: 399 mg/dL — ABNORMAL HIGH (ref 70–99)
Potassium: 5.1 mmol/L (ref 3.5–5.1)
Sodium: 133 mmol/L — ABNORMAL LOW (ref 135–145)

## 2019-09-16 LAB — SARS CORONAVIRUS 2 (TAT 6-24 HRS): SARS Coronavirus 2: NEGATIVE

## 2019-09-16 MED ORDER — INSULIN GLARGINE 100 UNIT/ML ~~LOC~~ SOLN
8.0000 [IU] | Freq: Two times a day (BID) | SUBCUTANEOUS | Status: DC
  Administered 2019-09-16 – 2019-09-19 (×7): 8 [IU] via SUBCUTANEOUS
  Filled 2019-09-16 (×8): qty 0.08

## 2019-09-16 MED ORDER — HEPARIN SODIUM (PORCINE) 1000 UNIT/ML IJ SOLN
INTRAMUSCULAR | Status: AC
  Administered 2019-09-16: 3200 [IU]
  Filled 2019-09-16: qty 4

## 2019-09-16 NOTE — Progress Notes (Signed)
OT Cancellation Note  Patient Details Name: Melanie Hall MRN: [id number] DOB: [birth year]   Cancelled Treatment:    Reason Eval/Treat Not Completed: Fatigue/lethargy limiting ability to participate. Pt back from HD, however is eating lunch and declines any therapy today and requests OT return tomorrow stating that she is too tired today after HD  Britt Bottom 09/16/2019, 1:04 PM

## 2019-09-16 NOTE — Progress Notes (Signed)
[name] Progress Note   Subjective:   Patient seen on HD. Reports anxiety surrounding numbness/weakness of lower extremities. Denies SOB, CP, dizziness, N/V.   Objective Vitals:   09/16/19 0830 09/16/19 0900 09/16/19 0930 09/16/19 1000  BP: (!) 165/84 (!) 173/99 (!) 177/85 (!) 173/92  Pulse: 81 81 78 97  Resp:      Temp:      TempSrc:      SpO2:      Weight:      Height:       Physical Exam General: well developed female, alert and in NAD Heart: RRR, no murmurs, rubs or gallops Lungs: CTA bilaterally without wheezing, rhonchi or rales Abdomen: Soft, non-tender, non-distended. +BS Extremities: No edema b/l lower extremities, able to move all extremities on command Dialysis Access:  R IJ TDC without erythema/drainage, maturing LUE AVF + bruit  Additional Objective Labs: Basic Metabolic Panel: Recent Labs  Lab 09/12/19 0349 09/12/19 0349 09/13/19 0434 09/13/19 0434 09/14/19 0434 09/14/19 0434 09/15/19 0344 09/15/19 2124 09/16/19 0409  NA 138   < > 139   < > 138  --  135  --  133*  K 3.7   < > 4.5   < > 4.3  --  4.3  --  5.1  CL 100   < > 101   < > 101  --  99  --  98  CO2 27   < > 27   < > 26  --  24  --  21*  GLUCOSE 123*   < > 129*   < > 161*   < > 140* 434* 399*  BUN 10   < > 19   < > 29*  --  12  --  28*  CREATININE 2.58*   < > 3.46*   < > 4.68*  --  2.78*  --  3.93*  CALCIUM 8.2*   < > 8.3*   < > 8.5*  --  8.3*  --  8.6*  PHOS 2.9  --  3.8  --  4.4  --   --   --   --    < > = values in this interval not displayed.   Liver Function Tests: Recent Labs  Lab 09/10/19 0021 09/10/19 0021 09/12/19 0349 09/13/19 0434 09/14/19 0434  AST 15  --   --   --   --   ALT 17  --   --   --   --   ALKPHOS 154*  --   --   --   --   BILITOT 1.2  --   --   --   --   PROT 6.6  --   --   --   --   ALBUMIN 2.9*   < > 2.5* 2.6* 2.6*   < > = values in this interval not displayed.   No results for input(s): LIPASE, AMYLASE in the last 168  hours. CBC: Recent Labs  Lab 09/11/19 0626 09/11/19 0626 09/12/19 0349 09/13/19 0434 09/14/19 0434 09/15/19 0344 09/16/19 0409  WBC 6.7   < > 6.4  --  5.6 5.1 5.5  NEUTROABS  --   --  3.8  --  3.4 2.7  --   HGB 9.7*   < > 8.8*   < > 8.6* 9.0* 9.8*  HCT 32.1*   < > 28.7*   < > 28.2* 28.6* 30.9*  MCV 101.9*  --  98.6  --  98.9  96.6 97.2  PLT 148*   < > 153  --  154 147* 163   < > = values in this interval not displayed.   Blood Culture    Component Value Date/Time   SDES  09/10/2019 0129    URINE, RANDOM Performed at [facility], [address], [place], [place] [address]    [facility]  09/10/2019 0129    NONE Performed at [facility], [address], [place], [place] [address]    CULT MULTIPLE SPECIES PRESENT, SUGGEST RECOLLECTION (A) 09/10/2019 0129   REPTSTATUS 09/11/2019 FINAL 09/10/2019 0129    CBG: Recent Labs  Lab 09/15/19 1612 09/15/19 2025 09/15/19 2121 09/16/19 0014 09/16/19 0718  GLUCAP 245* 419* 401* 435* 327*    Studies/Results: MR THORACIC SPINE WO CONTRAST  Result Date: 09/15/2019 CLINICAL DATA:  Low back pain. Rule out cauda equina syndrome. Known L1 fracture. EXAM: MRI THORACIC AND LUMBAR SPINE WITHOUT CONTRAST TECHNIQUE: Multiplanar and multiecho pulse sequences of the thoracic and lumbar spine were obtained without intravenous contrast. COMPARISON:  CT lumbar spine 08/23/2019 FINDINGS: MRI THORACIC SPINE FINDINGS Alignment:  Normal. Vertebrae: Normal bone marrow. No fracture or mass in the thoracic spine. L1 fracture described below. Cord:  Normal cord signal.  No cord lesion or cord compression Paraspinal and other soft tissues: Large right pleural effusion and moderate left pleural effusion with compressive atelectasis in both lungs right greater than left. Disc levels: No significant stenosis in the thoracic spine. Small right-sided disc protrusion T6-7. Mild facet degeneration lower thoracic spine. No significant foraminal stenosis. MRI LUMBAR  SPINE FINDINGS Segmentation:  Normal Alignment:  Normal Vertebrae: Moderately severe compression fracture of L1 vertebral body with diffuse bone marrow edema. Fracture is similar to the prior CT. No retropulsion of bone into the canal and no spinal stenosis. Conus medullaris and cauda equina: Conus extends to the L2 level. Conus and cauda equina appear normal. Paraspinal and other soft tissues: Mild paraspinous edema around the L1 compression fracture. No retroperitoneal mass. Small left perinephric hematoma is improved from the CT of 08/13/2019. This is related to an earlier renal biopsy. 8 x 10 mm hemorrhagic cyst or hematoma in the left lower pole likely the biopsy site. Disc levels: T12-L1: Negative L1-2: Negative L2-3: Negative L3-4: Negative L4-5: Negative L5-S1: Normal disc space. Mild facet degeneration. Negative for stenosis. IMPRESSION: MR THORACIC SPINE IMPRESSION Negative for thoracic fracture or mass. No significant stenosis in the thoracic spine. MR LUMBAR SPINE IMPRESSION Moderately severe compression fracture L1 is stable from the CT of 08/23/2019. No retropulsion of bone into the canal and no spinal stenosis at this level. No significant spinal stenosis in the remainder of the lumbar spine Improving left perinephric hematoma following  renal biopsy. Electronically Signed   By: Franchot Gallo M.D.   On: 09/15/2019 14:05   MR LUMBAR SPINE WO CONTRAST  Result Date: 09/15/2019 CLINICAL DATA:  Low back pain. Rule out cauda equina syndrome. Known L1 fracture. EXAM: MRI THORACIC AND LUMBAR SPINE WITHOUT CONTRAST TECHNIQUE: Multiplanar and multiecho pulse sequences of the thoracic and lumbar spine were obtained without intravenous contrast. COMPARISON:  CT lumbar spine 08/23/2019 FINDINGS: MRI THORACIC SPINE FINDINGS Alignment:  Normal. Vertebrae: Normal bone marrow. No fracture or mass in the thoracic spine. L1 fracture described below. Cord:  Normal cord signal.  No cord lesion or cord compression  Paraspinal and other soft tissues: Large right pleural effusion and moderate left pleural effusion with compressive atelectasis in both lungs right greater than left. Disc  levels: No significant stenosis in the thoracic spine. Small right-sided disc protrusion T6-7. Mild facet degeneration lower thoracic spine. No significant foraminal stenosis. MRI LUMBAR SPINE FINDINGS Segmentation:  Normal Alignment:  Normal Vertebrae: Moderately severe compression fracture of L1 vertebral body with diffuse bone marrow edema. Fracture is similar to the prior CT. No retropulsion of bone into the canal and no spinal stenosis. Conus medullaris and cauda equina: Conus extends to the L2 level. Conus and cauda equina appear normal. Paraspinal and other soft tissues: Mild paraspinous edema around the L1 compression fracture. No retroperitoneal mass. Small left perinephric hematoma is improved from the CT of 08/13/2019. This is related to an earlier renal biopsy. 8 x 10 mm hemorrhagic cyst or hematoma in the left lower pole likely the biopsy site. Disc levels: T12-L1: Negative L1-2: Negative L2-3: Negative L3-4: Negative L4-5: Negative L5-S1: Normal disc space. Mild facet degeneration. Negative for stenosis. IMPRESSION: MR THORACIC SPINE IMPRESSION Negative for thoracic fracture or mass. No significant stenosis in the thoracic spine. MR LUMBAR SPINE IMPRESSION Moderately severe compression fracture L1 is stable from the CT of 08/23/2019. No retropulsion of bone into the canal and no spinal stenosis at this level. No significant spinal stenosis in the remainder of the lumbar spine Improving left perinephric hematoma following  renal biopsy. Electronically Signed   By: Franchot Gallo M.D.   On: 09/15/2019 14:05   Medications: . ferric gluconate (FERRLECIT/NULECIT) IV Stopped (09/16/19 1021)   . amLODipine  5 mg Oral Daily  . carvedilol  6.25 mg Oral BID WC  . Chlorhexidine Gluconate Cloth  6 each Topical Q0600  . clotrimazole  1  Applicatorful Vaginal QHS  . darbepoetin (ARANESP) injection - DIALYSIS  40 mcg Intravenous Q Mon-HD  . gabapentin  300 mg Oral QHS  . heparin      . heparin  5,000 Units Subcutaneous Q8H  . insulin aspart  0-5 Units Subcutaneous QHS  . insulin aspart  0-6 Units Subcutaneous TID WC  . insulin glargine  8 Units Subcutaneous BID  . levothyroxine  25 mcg Oral QAC breakfast  . multivitamin  1 tablet Oral QHS  . pantoprazole  40 mg Oral Daily  . sertraline  25 mg Oral Daily    Dialysis Orders: ESRD [facility] MWF  Assessment/Plan: 1. Bilateral lower extremity numbness/incontince: Neuro following, MRI thoracic spine showing no significant spial stenosis and improving L perinephric hematoma. Suspecting polyradiculopathy from longstanding uncontrolled DM, recommending PT and outpatient EMG.  2. ESRD:  [facility] patient on MWF patient. Tolerating HD well today, UFG 3L. K+ controlled. Continue MWF schedule.  3. HTN/volume:  BP elevated at beginning of HD. UFG 3L today. Continue amlodipine and carvedilol, volume management with HD.  4. Anemia: Hgb 9.8- trending up. Last Tsat low. Continue aranesp and IV Fe.  5. Secondary hyperparathyroidism:  Corrected calcium 9.7, phos at goal. Not currently on a binder.   6. Nutrition:  K+ and phos controlled. Recommend fluid restriction.   Anice Paganini, PA-C 09/16/2019, 10:21 AM  [facility] Pager: [phone]

## 2019-09-16 NOTE — Progress Notes (Signed)
OT Cancellation Note  Patient Details Name: Melanie Hall MRN: [id number] DOB: [birth year]   Cancelled Treatment:    Reason Eval/Treat Not Completed: Patient at procedure or test/ unavailable. Pt at HD, will try back next later as able/appropriate  Britt Bottom 09/16/2019, 9:34 AM

## 2019-09-16 NOTE — Progress Notes (Signed)
2031 : CBG result of 419 mg/dL. textpaged On call provider NP Baltazar Najjar and made aware. Also, ordered Glucose lab drawn stat for verification. Got a call back to recheck pt. CBG on another machine. CBG result 401 mg/dL. Order placed and followed. No other issues.

## 2019-09-16 NOTE — Plan of Care (Signed)
  Problem: Education: Goal: Knowledge of General Education information will improve Description Including pain rating scale, medication(s)/side effects and non-pharmacologic comfort measures Outcome: Progressing   

## 2019-09-16 NOTE — Progress Notes (Addendum)
NEUROLOGY PROGRESS NOTE   Subjective:  Patient currently in dialysis.  She states that she still has decreased sensation in bilateral lower legs and also in the right upper extremity.  She also states that her right shoulder hurts when palpated.  Exam: Vitals:   09/16/19 0830 09/16/19 0900  BP: (!) 165/84 (!) 173/99  Pulse: 81 81  Resp:    Temp:    SpO2:      ROS General ROS: negative for - chills, fatigue, fever, night sweats, weight gain or weight loss Psychological ROS: negative for - behavioral disorder, hallucinations, memory difficulties, mood swings or suicidal ideation Ophthalmic ROS: negative for - blurry vision, double vision, eye pain or loss of vision ENT ROS: negative for - epistaxis, nasal discharge, oral lesions, sore throat, tinnitus or vertigo Cardiovascular ROS: negative for - chest pain, dyspnea on exertion, edema or irregular heartbeat Gastrointestinal ROS: negative for - abdominal pain, diarrhea, hematemesis, nausea/vomiting or stool incontinence Genito-Urinary ROS: negative for - dysuria, hematuria, incontinence or urinary frequency/urgency Musculoskeletal ROS: negative for - joint swelling or muscular weakness Neurological ROS: as noted in HPI Dermatological ROS: negative for rash and skin lesion changes    Physical Exam  Constitutional: Appears well-developed and well-nourished.  Psych: Affect appropriate to situation Eyes: No scleral injection HENT: No OP obstrucion Head: Normocephalic.  Cardiovascular: Normal rate and regular rhythm.  Respiratory: Effort normal, non-labored breathing GI: Soft.  No distension. There is no tenderness.  Skin: WDI  Neuro:  Mental Status: Alert, oriented, thought content appropriate.  Speech fluent without evidence of aphasia.  Able to follow 3 step commands without difficulty. Cranial Nerves: II:  Visual fields grossly normal, PERRL III,IV, VI: ptosis not present, extra-ocular motions intact bilaterally  V,VII: smile  symmetric, facial light touch sensation normal bilaterally VIII: hearing normal bilaterally IX,X: Palate rises midline XI: bilateral shoulder shrug XII: midline tongue extension Motor: Right : Upper extremity   5/5    Left:     Upper extremity   5/5  Lower extremity   5/5     Lower extremity   5/5 Tone and bulk:normal tone throughout; no atrophy noted Sensory: Patient continues to have decreased sensation to cold touch, light touch and pinprick from feet up to upper thigh. Deep Tendon Reflexes: 2+ and symmetric throughout UE No KJ Plantars: Right: downgoing   Left: downgoing   Medications:  Scheduled: . amLODipine  5 mg Oral Daily  . carvedilol  6.25 mg Oral BID WC  . Chlorhexidine Gluconate Cloth  6 each Topical Q0600  . clotrimazole  1 Applicatorful Vaginal QHS  . darbepoetin (ARANESP) injection - DIALYSIS  40 mcg Intravenous Q Mon-HD  . gabapentin  300 mg Oral QHS  . heparin  5,000 Units Subcutaneous Q8H  . insulin aspart  0-5 Units Subcutaneous QHS  . insulin aspart  0-6 Units Subcutaneous TID WC  . insulin glargine  8 Units Subcutaneous BID  . levothyroxine  25 mcg Oral QAC breakfast  . multivitamin  1 tablet Oral QHS  . pantoprazole  40 mg Oral Daily  . sertraline  25 mg Oral Daily   Continuous: . ferric gluconate (FERRLECIT/NULECIT) IV     GYF:[id number] **OR** acetaminophen, calcium carbonate (dosed in mg elemental calcium), camphor-menthol **AND** hydrOXYzine, docusate sodium, feeding supplement (NEPRO CARB STEADY), ondansetron **OR** ondansetron (ZOFRAN) IV, sorbitol, zolpidem  Pertinent Labs/Diagnostics: TSH-4.565 Sed rate 45 A1c 7.2 B12 818   MRI of cervical spine --shows no acute or focal lesion to explain the patient's symptoms.  Straightening of the normal cervical lordosis is nonspecific, but can seen in the setting of muscle strain or ongoing pain.  MR THORACIC SPINE WO CONTRAST Result Date: 09/15/2019  IMPRESSION: MR THORACIC SPINE IMPRESSION Negative  for thoracic fracture or mass. No significant stenosis in the thoracic spine. MR LUMBAR SPINE IMPRESSION Moderately severe compression fracture L1 is stable from the CT of 08/23/2019. No retropulsion of bone into the canal and no spinal stenosis at this level. No significant spinal stenosis in the remainder of the lumbar spine Improving left perinephric hematoma following  renal biopsy. Electronically Signed   By: Franchot Gallo M.D.   On: 09/15/2019 14:05    Etta Quill PA-C [facility] [phone]  Assessment: 49 year old female with a 4 week history of progressive bilateral lower extremity weakness, numbness, bowel and bladder incontinence 1. Continue to suspect patient likely has polyradiculopathy from longstanding uncontrolled diabetes resulting in patient's numbness and weakness. 2. There may be a functional component as well, given fluctuations in apparent strength with different activities/practitioners as documented in other notes.    3. MRI of cervical, thoracic and lumbar spine showing no structural explanation for the patient's presentation.  Recommendations: 1.  Recommend patient continue physical therapy inpatient while in hospital and outpatient when not in hospital. 2.  As an outpatient patient will need to be seen by neurology for nerve conduction study and EMG. 3.  At this point in time neurology will sign off. Please call if there are additional questions.    Electronically signed: Dr. Kerney Elbe 09/16/2019, 9:30 AM

## 2019-09-16 NOTE — Progress Notes (Signed)
PROGRESS NOTE    Melanie Hall  NLG:[id number] DOB: Jun 20, 1971 DOA: 09/09/2019 PCP: [facility]   Brief Narrative: 49 year old female with history of hypertension, diabetes, ESRD recently started on dialysis presented with weakness.  Patient is started HD 4 weeks PTA and around the same time inability to walk with generalized weakness.  Numbness and tingling in her arms and legs and also urinary and fecal incontinence.  As per daughter patient has been in and out of hospital for 2 to 3 weeks, during the time she was hospitalized she went and got a wants to walk and has been unable to walk since.  [facility] has been helping at home and there is concerns about her inability to walk.  Patient is not taking medication, not on insulin and daughter is taking her to dialysis Monday Wednesday Friday and otherwise only eats fast food does not want to eat  right.  Strong history of  mental health disorder in the family and daughter agreed for psychiatry evaluation. In the ER work-up showed UTI, MRI was recommended for further evaluation along with neurology and patient was admitted.  Patient was seen by neurology, nephrology.  Patient underwent further work-up TSH 4.5, sed rate 45, A1c 10.2 B12 818.  Patient had further imaging . BACK IN feb she had  CT head, CT lumbar spine, CT cervical spine.  During this admission she underwent MRI of cervical spine lumbar spine thoracic spine as resulted below.  See needed Anastassia for  thoracic and lumbar MRI  Subjective: Seen in the dialysis.  She is able to move her lower extremities well, but complains of numbness.  Sensation intact.  Complains of mild headache denies chest pain nausea vomiting fever chills. No acute events overnight.  Assessment & Plan:  Inability to walk/numbness tingling/fecal and urinary incontinence/debility: Patient is able to move her lower extremities and upper extremities.  Extensive work-up per neurology including ESR B12, CRP  8 5, RBC folate at 1312, TSH 3.2.  MRI cervical lumbar and thoracic spine.  Suspecting polyradiculopathy likely from longstanding uncontrolled diabetes resulting in patient's numbness.  Continue PT OT and will need outpatient neurology evaluation for nerve conduction EMG.  Neurology signed off.  Continue Neurontin  per neurology.  ESRD on HD MWF: Recently started on HD 4 weeks ago, and IV tolerates well.  In HD today continue nephrology follow-up.  Type 2 diabetes with nephropathy/HLD/HTN: A1c 7.2, blood sugar is controlled on sliding solution.  Blood pressure on higher side, will continue amlodipine and Coreg and monitor and uptitrate medication.  Vaginal candidiasis status post Diflucan.  Continue clotrimazole cream nightly.  Depression: Mood is overall stable. Concerned that patient's symptoms may have a psychogenic component psychiatric consultation obtained and fail patient did not meet inpatient criteria and recommended social worker follow-up with daughter due to voiced concern.  Started on Zoloft per psych recommendation.  Hypothyroidism euthyroid, continue Synthroid  Acute cystitis without hematuria: received a dose of ceftriaxone in the ER.  Urine culture multiple species.  And asymptomatic no fever no leukocytosis.   Loose stool with incontinence.  C. difficile negative.  Symptoms improved.  Gen Weakness/debility: Continue PT OT.  Skilled nursing.  Placement.    Body mass index is 29.72 kg/m.   DVT prophylaxis:Heparin Code Status:FUL  Family Communication: plan of care discussed with patient at bedside. Disposition Plan: Patient is from:HOME Anticipated Disposition: to  SNF Barriers to discharge or conditions that needs to be met prior to discharge: Awaiting skilled nursing  facility placement.  Completed work-up.  She is medically stable. COVID 19 Neg from 3/9  Consultants:see note  Procedures:  MRI of cervical spine --shows no acute or focal lesion to explain the  patient's symptoms.  Straightening of the normal cervical lordosis is nonspecific, but can seen in the setting of muscle strain or ongoing pain.  MR Lumbar and THORACIC SPINE WO CONTRAST  Result Date: 09/15/2019  IMPRESSION: MR THORACIC SPINE IMPRESSION Negative for thoracic fracture or mass. No significant stenosis in the thoracic spine. MR LUMBAR SPINE IMPRESSION Moderately severe compression fracture L1 is stable from the CT of 08/23/2019. No retropulsion of bone into the canal and no spinal stenosis at this level. No significant spinal stenosis in the remainder of the lumbar spine Improving left perinephric hematoma following  renal biopsy. Electronically Signed   By: Franchot Gallo M.D.   On: 09/15/2019 14:05   Microbiology:see note  Medications: Scheduled Meds:  amLODipine  5 mg Oral Daily   carvedilol  6.25 mg Oral BID WC   Chlorhexidine Gluconate Cloth  6 each Topical Q0600   clotrimazole  1 Applicatorful Vaginal QHS   darbepoetin (ARANESP) injection - DIALYSIS  40 mcg Intravenous Q Mon-HD   gabapentin  300 mg Oral QHS   heparin       heparin  5,000 Units Subcutaneous Q8H   insulin aspart  0-5 Units Subcutaneous QHS   insulin aspart  0-6 Units Subcutaneous TID WC   insulin glargine  8 Units Subcutaneous BID   levothyroxine  25 mcg Oral QAC breakfast   multivitamin  1 tablet Oral QHS   pantoprazole  40 mg Oral Daily   sertraline  25 mg Oral Daily   Continuous Infusions:  ferric gluconate (FERRLECIT/NULECIT) IV      Antimicrobials: Anti-infectives (From admission, onward)   Start     Dose/Rate Route Frequency Ordered Stop   09/13/19 1445  fluconazole (DIFLUCAN) tablet 150 mg     150 mg Oral  Once 09/13/19 1435 09/13/19 1807   09/11/19 0230  cefTRIAXone (ROCEPHIN) 1 g in sodium chloride 0.9 % 100 mL IVPB     1 g 200 mL/hr over 30 Minutes Intravenous  Once 09/10/19 1021 09/11/19 0330   09/10/19 0245  cefTRIAXone (ROCEPHIN) 1 g in sodium chloride 0.9 % 100 mL  IVPB     1 g 200 mL/hr over 30 Minutes Intravenous  Once 09/10/19 0231 09/10/19 0321       Objective: Vitals: Today's Vitals   09/16/19 0830 09/16/19 0900 09/16/19 0930 09/16/19 1000  BP: (!) 165/84 (!) 173/99 (!) 177/85 (!) 173/92  Pulse: 81 81 78 97  Resp:      Temp:      TempSrc:      SpO2:      Weight:      Height:      PainSc:        Intake/Output Summary (Last 24 hours) at 09/16/2019 1012 Last data filed at 09/16/2019 0900 Gross per 24 hour  Intake 1250 ml  Output 600 ml  Net 650 ml   Filed Weights   09/14/19 1335 09/14/19 1710 09/16/19 0741  Weight: 73.8 kg 70.3 kg 76.1 kg   Weight change:    Intake/Output from previous day: 03/09 0701 - 03/10 0700 In: 1010 [P.O.:960; I.V.:50] Out: 600 [Urine:600] Intake/Output this shift: Total I/O In: 240 [P.O.:240] Out: 0   Examination:  General exam: AAOX3 ,NAD, weak appearing. HEENT:Oral mucosa moist, Ear/Nose WNL grossly,dentition normal. Respiratory system: bilaterally  clear,no wheezing or crackles,no use of accessory muscle, non tender. Cardiovascular system: S1 & S2 +, regular, No JVD. Gastrointestinal system: Abdomen soft, NT,ND, BS+. Nervous System:Alert, awake, moving extremities and grossly nonfocal, diminished sensation in lower extremities but intact. Extremities: No edema, distal peripheral pulses palpable.  Skin: No rashes,no icterus. MSK: Normal muscle bulk,tone, power Right chest with HD catheter.  Data Reviewed: I have personally reviewed following labs and imaging studies CBC: Recent Labs  Lab 09/10/19 0021 09/10/19 1820 09/11/19 0626 09/11/19 0626 09/12/19 0349 09/13/19 0434 09/14/19 0434 09/15/19 0344 09/16/19 0409  WBC 9.4  --  6.7  --  6.4  --  5.6 5.1 5.5  NEUTROABS 6.7  --   --   --  3.8  --  3.4 2.7  --   HGB 10.0*  --  9.7*   < > 8.8* 8.9* 8.6* 9.0* 9.8*  HCT 32.3*   < > 32.1*   < > 28.7* 29.0* 28.2* 28.6* 30.9*  MCV 100.6*  --  101.9*  --  98.6  --  98.9 96.6 97.2  PLT 171   --  148*  --  153  --  154 147* 163   < > = values in this interval not displayed.   Basic Metabolic Panel: Recent Labs  Lab 09/11/19 0626 09/11/19 0626 09/12/19 0349 09/12/19 0349 09/13/19 0434 09/14/19 0434 09/15/19 0344 09/15/19 2124 09/16/19 0409  NA 141   < > 138  --  139 138 135  --  133*  K 4.1   < > 3.7  --  4.5 4.3 4.3  --  5.1  CL 101   < > 100  --  101 101 99  --  98  CO2 25   < > 27  --  '27 26 24  ' --  21*  GLUCOSE 150*   < > 123*   < > 129* 161* 140* 434* 399*  BUN 19   < > 10  --  19 29* 12  --  28*  CREATININE 3.62*   < > 2.58*  --  3.46* 4.68* 2.78*  --  3.93*  CALCIUM 8.7*   < > 8.2*  --  8.3* 8.5* 8.3*  --  8.6*  PHOS 4.0  --  2.9  --  3.8 4.4  --   --   --    < > = values in this interval not displayed.   GFR: Estimated Creatinine Clearance: 16.9 mL/min (A) (by C-G formula based on SCr of 3.93 mg/dL (H)). Liver Function Tests: Recent Labs  Lab 09/10/19 0021 09/12/19 0349 09/13/19 0434 09/14/19 0434  AST 15  --   --   --   ALT 17  --   --   --   ALKPHOS 154*  --   --   --   BILITOT 1.2  --   --   --   PROT 6.6  --   --   --   ALBUMIN 2.9* 2.5* 2.6* 2.6*   No results for input(s): LIPASE, AMYLASE in the last 168 hours. No results for input(s): AMMONIA in the last 168 hours. Coagulation Profile: No results for input(s): INR, PROTIME in the last 168 hours. Cardiac Enzymes: No results for input(s): CKTOTAL, CKMB, CKMBINDEX, TROPONINI in the last 168 hours. BNP (last 3 results) No results for input(s): PROBNP in the last 8760 hours. HbA1C: No results for input(s): HGBA1C in the last 72 hours. CBG: Recent Labs  Lab 09/15/19 1612 09/15/19  2025 09/15/19 2121 09/16/19 0014 09/16/19 0718  GLUCAP 245* 419* 401* 435* 327*   Lipid Profile: No results for input(s): CHOL, HDL, LDLCALC, TRIG, CHOLHDL, LDLDIRECT in the last 72 hours. Thyroid Function Tests: Recent Labs    09/15/19 0344  TSH 4.565*   Anemia Panel: No results for input(s):  VITAMINB12, FOLATE, FERRITIN, TIBC, IRON, RETICCTPCT in the last 72 hours. Sepsis Labs: Recent Labs  Lab 09/10/19 0021  LATICACIDVEN 0.8    Recent Results (from the past 240 hour(s))  Blood culture (routine x 2)     Status: None   Collection Time: 09/10/19 12:22 AM   Specimen: BLOOD  Result Value Ref Range Status   Specimen Description BLOOD BLOOD RIGHT WRIST  Final   Special Requests   Final    BOTTLES DRAWN AEROBIC AND ANAEROBIC Blood Culture adequate volume   Culture   Final    NO GROWTH 5 DAYS Performed at [facility], [address] [place], [place] [address]    Report Status 09/15/2019 FINAL  Final  Blood culture (routine x 2)     Status: None   Collection Time: 09/10/19 12:28 AM   Specimen: BLOOD  Result Value Ref Range Status   Specimen Description BLOOD RIGHT ANTECUBITAL  Final   Special Requests   Final    BOTTLES DRAWN AEROBIC AND ANAEROBIC Blood Culture adequate volume   Culture   Final    NO GROWTH 5 DAYS Performed at [facility], [address] [place], [place] [address]    Report Status 09/15/2019 FINAL  Final  Urine Culture     Status: Abnormal   Collection Time: 09/10/19  1:29 AM   Specimen: Urine, Random  Result Value Ref Range Status   Specimen Description   Final    URINE, RANDOM Performed at [facility], [address] [place], [place] [address]    Special Requests   Final    NONE Performed at [facility], [address], [place],  [address]    Culture MULTIPLE SPECIES PRESENT, SUGGEST RECOLLECTION (A)  Final   Report Status 09/11/2019 FINAL  Final  Respiratory Panel by RT PCR (Flu A&B, Covid) - Nasopharyngeal Swab     Status: None   Collection Time: 09/10/19  1:29 AM   Specimen: Nasopharyngeal Swab  Result Value Ref Range Status   SARS Coronavirus 2 by RT PCR NEGATIVE NEGATIVE Final    Comment: (NOTE) SARS-CoV-2 target nucleic acids are NOT DETECTED. The SARS-CoV-2 RNA is generally detectable in upper respiratoy specimens during  the acute phase of infection. The lowest concentration of SARS-CoV-2 viral copies this assay can detect is 131 copies/mL. A negative result does not preclude SARS-Cov-2 infection and should not be used as the sole basis for treatment or other patient management decisions. A negative result may occur with  improper specimen collection/handling, submission of specimen other than nasopharyngeal swab, presence of viral mutation(s) within the areas targeted by this assay, and inadequate number of viral copies (<131 copies/mL). A negative result must be combined with clinical observations, patient history, and epidemiological information. The expected result is Negative. Fact Sheet for Patients:  PinkCheek.be Fact Sheet for Healthcare Providers:  GravelBags.it This test is not yet ap proved or cleared by the [place] FDA and  has been authorized for detection and/or diagnosis of SARS-CoV-2 by FDA under an Emergency Use Authorization (EUA). This EUA will remain  in effect (meaning this test can be used) for the duration of the COVID-19 declaration under Section  564(b)(1) of the Act, 21 U.S.C. section 360bbb-3(b)(1), unless the authorization is terminated or revoked sooner.    Influenza A by PCR NEGATIVE NEGATIVE Final   Influenza B by PCR NEGATIVE NEGATIVE Final    Comment: (NOTE) The Xpert Xpress SARS-CoV-2/FLU/RSV assay is intended as an aid in  the diagnosis of influenza from Nasopharyngeal swab specimens and  should not be used as a sole basis for treatment. Nasal washings and  aspirates are unacceptable for Xpert Xpress SARS-CoV-2/FLU/RSV  testing. Fact Sheet for Patients: PinkCheek.be Fact Sheet for Healthcare Providers: GravelBags.it This test is not yet approved or cleared by the [place] FDA and  has been authorized for detection and/or diagnosis of  SARS-CoV-2 by  FDA under an Emergency Use Authorization (EUA). This EUA will remain  in effect (meaning this test can be used) for the duration of the  Covid-19 declaration under Section 564(b)(1) of the Act, 21  U.S.C. section 360bbb-3(b)(1), unless the authorization is  terminated or revoked. Performed at [facility], [address], [place], [place] [address]   C difficile quick scan w PCR reflex     Status: None   Collection Time: 09/15/19  1:56 AM   Specimen: STOOL  Result Value Ref Range Status   C Diff antigen NEGATIVE NEGATIVE Final   C Diff toxin NEGATIVE NEGATIVE Final   C Diff interpretation No C. difficile detected.  Final    Comment: Performed at [facility], [place] [address], [place], [place] [address]  SARS CORONAVIRUS 2 (TAT 6-24 HRS) Nasopharyngeal Nasopharyngeal Swab     Status: None   Collection Time: 09/15/19  9:27 PM   Specimen: Nasopharyngeal Swab  Result Value Ref Range Status   SARS Coronavirus 2 NEGATIVE NEGATIVE Final    Comment: (NOTE) SARS-CoV-2 target nucleic acids are NOT DETECTED. The SARS-CoV-2 RNA is generally detectable in upper and lower respiratory specimens during the acute phase of infection. Negative results do not preclude SARS-CoV-2 infection, do not rule out co-infections with other pathogens, and should not be used as the sole basis for treatment or other patient management decisions. Negative results must be combined with clinical observations, patient history, and epidemiological information. The expected result is Negative. Fact Sheet for Patients: SugarRoll.be Fact Sheet for Healthcare Providers: https://www.woods-mathews.com/ This test is not yet approved or cleared by the [place] FDA and  has been authorized for detection and/or diagnosis of SARS-CoV-2 by FDA under an Emergency Use Authorization (EUA). This EUA will remain  in effect (meaning this test can be used) for the  duration of the COVID-19 declaration under Section 56 4(b)(1) of the Act, 21 U.S.C. section 360bbb-3(b)(1), unless the authorization is terminated or revoked sooner. Performed at [facility], [place] [address], [place], [place] [address]       Radiology Studies: MR THORACIC SPINE WO CONTRAST  Result Date: 09/15/2019 CLINICAL DATA:  Low back pain. Rule out cauda equina syndrome. Known L1 fracture. EXAM: MRI THORACIC AND LUMBAR SPINE WITHOUT CONTRAST TECHNIQUE: Multiplanar and multiecho pulse sequences of the thoracic and lumbar spine were obtained without intravenous contrast. COMPARISON:  CT lumbar spine 08/23/2019 FINDINGS: MRI THORACIC SPINE FINDINGS Alignment:  Normal. Vertebrae: Normal bone marrow. No fracture or mass in the thoracic spine. L1 fracture described below. Cord:  Normal cord signal.  No cord lesion or cord compression Paraspinal and other soft tissues: Large right pleural effusion and moderate left pleural effusion with compressive atelectasis in both lungs right greater than left. Disc levels:  No significant stenosis in the thoracic spine. Small right-sided disc protrusion T6-7. Mild facet degeneration lower thoracic spine. No significant foraminal stenosis. MRI LUMBAR SPINE FINDINGS Segmentation:  Normal Alignment:  Normal Vertebrae: Moderately severe compression fracture of L1 vertebral body with diffuse bone marrow edema. Fracture is similar to the prior CT. No retropulsion of bone into the canal and no spinal stenosis. Conus medullaris and cauda equina: Conus extends to the L2 level. Conus and cauda equina appear normal. Paraspinal and other soft tissues: Mild paraspinous edema around the L1 compression fracture. No retroperitoneal mass. Small left perinephric hematoma is improved from the CT of 08/13/2019. This is related to an earlier renal biopsy. 8 x 10 mm hemorrhagic cyst or hematoma in the left lower pole likely the biopsy site. Disc levels: T12-L1: Negative L1-2: Negative  L2-3: Negative L3-4: Negative L4-5: Negative L5-S1: Normal disc space. Mild facet degeneration. Negative for stenosis. IMPRESSION: MR THORACIC SPINE IMPRESSION Negative for thoracic fracture or mass. No significant stenosis in the thoracic spine. MR LUMBAR SPINE IMPRESSION Moderately severe compression fracture L1 is stable from the CT of 08/23/2019. No retropulsion of bone into the canal and no spinal stenosis at this level. No significant spinal stenosis in the remainder of the lumbar spine Improving left perinephric hematoma following  renal biopsy. Electronically Signed   By: Franchot Gallo M.D.   On: 09/15/2019 14:05   MR LUMBAR SPINE WO CONTRAST  Result Date: 09/15/2019 CLINICAL DATA:  Low back pain. Rule out cauda equina syndrome. Known L1 fracture. EXAM: MRI THORACIC AND LUMBAR SPINE WITHOUT CONTRAST TECHNIQUE: Multiplanar and multiecho pulse sequences of the thoracic and lumbar spine were obtained without intravenous contrast. COMPARISON:  CT lumbar spine 08/23/2019 FINDINGS: MRI THORACIC SPINE FINDINGS Alignment:  Normal. Vertebrae: Normal bone marrow. No fracture or mass in the thoracic spine. L1 fracture described below. Cord:  Normal cord signal.  No cord lesion or cord compression Paraspinal and other soft tissues: Large right pleural effusion and moderate left pleural effusion with compressive atelectasis in both lungs right greater than left. Disc levels: No significant stenosis in the thoracic spine. Small right-sided disc protrusion T6-7. Mild facet degeneration lower thoracic spine. No significant foraminal stenosis. MRI LUMBAR SPINE FINDINGS Segmentation:  Normal Alignment:  Normal Vertebrae: Moderately severe compression fracture of L1 vertebral body with diffuse bone marrow edema. Fracture is similar to the prior CT. No retropulsion of bone into the canal and no spinal stenosis. Conus medullaris and cauda equina: Conus extends to the L2 level. Conus and cauda equina appear normal. Paraspinal  and other soft tissues: Mild paraspinous edema around the L1 compression fracture. No retroperitoneal mass. Small left perinephric hematoma is improved from the CT of 08/13/2019. This is related to an earlier renal biopsy. 8 x 10 mm hemorrhagic cyst or hematoma in the left lower pole likely the biopsy site. Disc levels: T12-L1: Negative L1-2: Negative L2-3: Negative L3-4: Negative L4-5: Negative L5-S1: Normal disc space. Mild facet degeneration. Negative for stenosis. IMPRESSION: MR THORACIC SPINE IMPRESSION Negative for thoracic fracture or mass. No significant stenosis in the thoracic spine. MR LUMBAR SPINE IMPRESSION Moderately severe compression fracture L1 is stable from the CT of 08/23/2019. No retropulsion of bone into the canal and no spinal stenosis at this level. No significant spinal stenosis in the remainder of the lumbar spine Improving left perinephric hematoma following  renal biopsy. Electronically Signed   By: Franchot Gallo M.D.   On: 09/15/2019 14:05     LOS: 6 days  Time spent: More than 50% of that time was spent in counseling and/or coordination of care.  Antonieta Pert, MD [facility]  09/16/2019, 10:12 AM

## 2019-09-16 NOTE — Anesthesia Postprocedure Evaluation (Signed)
Anesthesia Post Note  Patient: Melanie Hall  Procedure(s) Performed: THORAIC AND LUMBER LOWER BACK PAIN (N/A )     Patient location during evaluation: PACU Anesthesia Type: General Level of consciousness: awake and alert Pain management: pain level controlled Vital Signs Assessment: post-procedure vital signs reviewed and stable Respiratory status: spontaneous breathing, nonlabored ventilation and respiratory function stable Cardiovascular status: blood pressure returned to baseline and stable Postop Assessment: no apparent nausea or vomiting Anesthetic complications: no    Last Vitals:  Vitals:   09/16/19 1100 09/16/19 1130  BP: (!) 179/89 (!) 180/91  Pulse: 76 77  Resp:    Temp:    SpO2:      Last Pain:  Vitals:   09/16/19 0741  TempSrc: Oral  PainSc: 0-No pain                 Lidia Collum

## 2019-09-16 NOTE — Plan of Care (Signed)
  Problem: Activity: Goal: Risk for activity intolerance will decrease Outcome: Not Progressing   

## 2019-09-17 LAB — GLUCOSE, CAPILLARY
Glucose-Capillary: 87 mg/dL (ref 70–99)
Glucose-Capillary: 143 mg/dL — ABNORMAL HIGH (ref 70–99)
Glucose-Capillary: 202 mg/dL — ABNORMAL HIGH (ref 70–99)
Glucose-Capillary: 186 mg/dL — ABNORMAL HIGH (ref 70–99)

## 2019-09-17 MED ORDER — CHLORHEXIDINE GLUCONATE CLOTH 2 % EX PADS
6.0000 | MEDICATED_PAD | Freq: Every day | CUTANEOUS | Status: DC
  Administered 2019-09-19: 6 via TOPICAL

## 2019-09-17 NOTE — Progress Notes (Addendum)
[facility] Progress Note   Subjective:   Pt seen in room. No new concerns, denies SOB and CP. No acute events overnight. Net UF 3L with HD yesterday.   Objective Vitals:   09/16/19 1333 09/16/19 1640 09/16/19 2049 09/17/19 0537  BP: (!) 169/77 (!) 154/72 (!) 141/77 (!) 157/76  Pulse:  76 75 70  Resp:  16 19 18   Temp:  97.9 F (36.6 C) 97.9 F (36.6 C) 98.2 F (36.8 C)  TempSrc:  Oral Oral Oral  SpO2:  97% 96% 97%  Weight:      Height:       Physical Exam General: well developed female, alert and in NAD Heart: RRR, no murmurs, rubs or gallops Lungs: CTA bilaterally without wheezing, rhonchi or rales Abdomen: Soft, non-distended. +BS Extremities: No edema b/l lower extremities Dialysis Access:  R IJ TDC without erythema/drainage, maturing LUE AVF   Additional Objective Labs: Basic Metabolic Panel: Recent Labs  Lab 09/12/19 0349 09/12/19 0349 09/13/19 0434 09/13/19 0434 09/14/19 0434 09/14/19 0434 09/15/19 0344 09/15/19 2124 09/16/19 0409  NA 138   < > 139   < > 138  --  135  --  133*  K 3.7   < > 4.5   < > 4.3  --  4.3  --  5.1  CL 100   < > 101   < > 101  --  99  --  98  CO2 27   < > 27   < > 26  --  24  --  21*  GLUCOSE 123*   < > 129*   < > 161*   < > 140* 434* 399*  BUN 10   < > 19   < > 29*  --  12  --  28*  CREATININE 2.58*   < > 3.46*   < > 4.68*  --  2.78*  --  3.93*  CALCIUM 8.2*   < > 8.3*   < > 8.5*  --  8.3*  --  8.6*  PHOS 2.9  --  3.8  --  4.4  --   --   --   --    < > = values in this interval not displayed.   Liver Function Tests: Recent Labs  Lab 09/12/19 0349 09/13/19 0434 09/14/19 0434  ALBUMIN 2.5* 2.6* 2.6*   CBC: Recent Labs  Lab 09/11/19 0626 09/11/19 0626 09/12/19 0349 09/13/19 0434 09/14/19 0434 09/15/19 0344 09/16/19 0409  WBC 6.7   < > 6.4  --  5.6 5.1 5.5  NEUTROABS  --   --  3.8  --  3.4 2.7  --   HGB 9.7*   < > 8.8*   < > 8.6* 9.0* 9.8*  HCT 32.1*   < > 28.7*   < > 28.2* 28.6* 30.9*  MCV 101.9*   --  98.6  --  98.9 96.6 97.2  PLT 148*   < > 153  --  154 147* 163   < > = values in this interval not displayed.   Blood Culture    Component Value Date/Time   SDES  09/10/2019 0129    URINE, RANDOM Performed at [facility], [address], [place], [place] [address]    [facility]  09/10/2019 0129    NONE Performed at [facility], [address] [place], [place] [address]    CULT MULTIPLE SPECIES PRESENT, SUGGEST RECOLLECTION (A) 09/10/2019 0129   REPTSTATUS 09/11/2019 FINAL 09/10/2019 0129  CBG: Recent Labs  Lab 09/16/19 1241 09/16/19 1639 09/16/19 2048 09/17/19 0656 09/17/19 1114  GLUCAP 158* 220* 246* 87 143*    Studies/Results: MR THORACIC SPINE WO CONTRAST  Result Date: 09/15/2019 CLINICAL DATA:  Low back pain. Rule out cauda equina syndrome. Known L1 fracture. EXAM: MRI THORACIC AND LUMBAR SPINE WITHOUT CONTRAST TECHNIQUE: Multiplanar and multiecho pulse sequences of the thoracic and lumbar spine were obtained without intravenous contrast. COMPARISON:  CT lumbar spine 08/23/2019 FINDINGS: MRI THORACIC SPINE FINDINGS Alignment:  Normal. Vertebrae: Normal bone marrow. No fracture or mass in the thoracic spine. L1 fracture described below. Cord:  Normal cord signal.  No cord lesion or cord compression Paraspinal and other soft tissues: Large right pleural effusion and moderate left pleural effusion with compressive atelectasis in both lungs right greater than left. Disc levels: No significant stenosis in the thoracic spine. Small right-sided disc protrusion T6-7. Mild facet degeneration lower thoracic spine. No significant foraminal stenosis. MRI LUMBAR SPINE FINDINGS Segmentation:  Normal Alignment:  Normal Vertebrae: Moderately severe compression fracture of L1 vertebral body with diffuse bone marrow edema. Fracture is similar to the prior CT. No retropulsion of bone into the canal and no spinal stenosis. Conus medullaris and cauda equina: Conus extends to the L2 level.  Conus and cauda equina appear normal. Paraspinal and other soft tissues: Mild paraspinous edema around the L1 compression fracture. No retroperitoneal mass. Small left perinephric hematoma is improved from the CT of 08/13/2019. This is related to an earlier renal biopsy. 8 x 10 mm hemorrhagic cyst or hematoma in the left lower pole likely the biopsy site. Disc levels: T12-L1: Negative L1-2: Negative L2-3: Negative L3-4: Negative L4-5: Negative L5-S1: Normal disc space. Mild facet degeneration. Negative for stenosis. IMPRESSION: MR THORACIC SPINE IMPRESSION Negative for thoracic fracture or mass. No significant stenosis in the thoracic spine. MR LUMBAR SPINE IMPRESSION Moderately severe compression fracture L1 is stable from the CT of 08/23/2019. No retropulsion of bone into the canal and no spinal stenosis at this level. No significant spinal stenosis in the remainder of the lumbar spine Improving left perinephric hematoma following  renal biopsy. Electronically Signed   By: Franchot Gallo M.D.   On: 09/15/2019 14:05   MR LUMBAR SPINE WO CONTRAST  Result Date: 09/15/2019 CLINICAL DATA:  Low back pain. Rule out cauda equina syndrome. Known L1 fracture. EXAM: MRI THORACIC AND LUMBAR SPINE WITHOUT CONTRAST TECHNIQUE: Multiplanar and multiecho pulse sequences of the thoracic and lumbar spine were obtained without intravenous contrast. COMPARISON:  CT lumbar spine 08/23/2019 FINDINGS: MRI THORACIC SPINE FINDINGS Alignment:  Normal. Vertebrae: Normal bone marrow. No fracture or mass in the thoracic spine. L1 fracture described below. Cord:  Normal cord signal.  No cord lesion or cord compression Paraspinal and other soft tissues: Large right pleural effusion and moderate left pleural effusion with compressive atelectasis in both lungs right greater than left. Disc levels: No significant stenosis in the thoracic spine. Small right-sided disc protrusion T6-7. Mild facet degeneration lower thoracic spine. No significant  foraminal stenosis. MRI LUMBAR SPINE FINDINGS Segmentation:  Normal Alignment:  Normal Vertebrae: Moderately severe compression fracture of L1 vertebral body with diffuse bone marrow edema. Fracture is similar to the prior CT. No retropulsion of bone into the canal and no spinal stenosis. Conus medullaris and cauda equina: Conus extends to the L2 level. Conus and cauda equina appear normal. Paraspinal and other soft tissues: Mild paraspinous edema around the L1 compression fracture. No retroperitoneal mass. Small left perinephric hematoma is  improved from the CT of 08/13/2019. This is related to an earlier renal biopsy. 8 x 10 mm hemorrhagic cyst or hematoma in the left lower pole likely the biopsy site. Disc levels: T12-L1: Negative L1-2: Negative L2-3: Negative L3-4: Negative L4-5: Negative L5-S1: Normal disc space. Mild facet degeneration. Negative for stenosis. IMPRESSION: MR THORACIC SPINE IMPRESSION Negative for thoracic fracture or mass. No significant stenosis in the thoracic spine. MR LUMBAR SPINE IMPRESSION Moderately severe compression fracture L1 is stable from the CT of 08/23/2019. No retropulsion of bone into the canal and no spinal stenosis at this level. No significant spinal stenosis in the remainder of the lumbar spine Improving left perinephric hematoma following  renal biopsy. Electronically Signed   By: Franchot Gallo M.D.   On: 09/15/2019 14:05   Medications: . ferric gluconate (FERRLECIT/NULECIT) IV Stopped (09/16/19 1021)   . amLODipine  5 mg Oral Daily  . carvedilol  6.25 mg Oral BID WC  . Chlorhexidine Gluconate Cloth  6 each Topical Q0600  . clotrimazole  1 Applicatorful Vaginal QHS  . darbepoetin (ARANESP) injection - DIALYSIS  40 mcg Intravenous Q Mon-HD  . gabapentin  300 mg Oral QHS  . heparin  5,000 Units Subcutaneous Q8H  . insulin aspart  0-5 Units Subcutaneous QHS  . insulin aspart  0-6 Units Subcutaneous TID WC  . insulin glargine  8 Units Subcutaneous BID  .  levothyroxine  25 mcg Oral QAC breakfast  . multivitamin  1 tablet Oral QHS  . pantoprazole  40 mg Oral Daily  . sertraline  25 mg Oral Daily    Dialysis OP: [facility] MWF    Assessment/Plan: 1. Bilateral lower extremity numbness/incontince: Neuro following, MRI thoracic/ lumbar spine neg for lesions/ stenosis. Cervical spine done 3/4 also negative. Neuro suspects polyradiculopathy from longstanding uncontrolled DM, recommending PT and outpatient EMG.  2. ESRD:  [place] patient on MWF patient. Tolerating HD well today, UFG 3L. K+ controlled. Continue MWF schedule.  3. HTN/volume:  BP remains elevated. Continue amlodipine and carvedilol, volume management with HD.  4. Anemia: Hgb 9.8- trending up. Last Ts[place] low. Continue aranesp and IV Fe.  5. Secondary hyperparathyroidism:  Corrected calcium 9.7, phos at goal. Not currently on a binder.   6. Nutrition:  K+ and phos controlled. Recommend fluid restriction.   Anice Paganini, PA-C 09/17/2019, 12:24 PM  [facility] Pager: [phone]  Pt seen, examined and agree w A/P as above.  Kelly Splinter  MD 09/17/2019, 12:47 PM

## 2019-09-17 NOTE — Plan of Care (Signed)
  Problem: Education: Goal: Knowledge of General Education information will improve Description Including pain rating scale, medication(s)/side effects and non-pharmacologic comfort measures Outcome: Progressing   

## 2019-09-17 NOTE — Addendum Note (Signed)
Addendum  created 09/17/19 3594 by Lidia Collum, MD   Attestation recorded in [place], [place] filed

## 2019-09-17 NOTE — TOC Initial Note (Signed)
Transition of Care [facility]) - Initial/Assessment Note    Patient Details  Name: Melanie Hall MRN: [id number] Date of Birth: September 10, 1970  [facility] CM/SW Contact:    Sable Feil, LCSW Phone Number: 09/17/2019, 3:38 PM  Clinical Narrative: Talked with patient at the bedside regarding her discharge disposition. Informed Ms. Wailes that although PT/OT recommended SNF, she would not be able to be discharged to a skilled facility as she has no insurance, and Ms. Carrillo expressed understanding. When asked, patient reported that she lives with her daughter and that there is always someone at home. Ms. Marchiano added that another one of her daughters has been wanting her to come stay with her and she will contact this daughter regarding staying with her for a couple of weeks at discharge. CSW will f/u with patient regarding the call to her daughter.               Expected Discharge Plan: Home/Self Care(Patient without insurance) Barriers to Discharge: Continued Medical Work up   Patient Goals and CMS Choice Patient states their goals for this hospitalization and ongoing recovery are:: To stay with one of her daughters for a couple of weeks, then return home CMS Medicare.gov Compare Post Acute Care list provided to:: Other (Comment Required)(Medicare SNF list not provided as patient has no insurance) Choice offered to / list presented to : NA  Expected Discharge Plan and Services Expected Discharge Plan: Home/Self Care(Patient without insurance) In-house Referral: Clinical Social Work                                            Prior Living Arrangements/Services   Lives with:: Adult Children Patient language and need for interpreter reviewed:: No Do you feel safe going back to the place where you live?: Yes(Patient comfortable with discharge home and reported that she will stay with one of her daughters for a couple of weeks as daughter has been wanting her to stay  with her. Ms. Lynam reported that she lives with another daughter.)      Need for Family Participation in Patient Care: No (Comment) Care giver support system in place?: Yes (comment)   Criminal Activity/Legal Involvement Pertinent to Current Situation/Hospitalization: No - Comment as needed  Activities of Daily Living Home Assistive Devices/Equipment: Gil[place] Rile (specify type) ADL Screening (condition at time of admission) Patient's cognitive ability adequate to safely complete daily activities?: Yes Is the patient deaf or have difficulty hearing?: No Does the patient have difficulty seeing, even when wearing glasses/contacts?: No Does the patient have difficulty concentrating, remembering, or making decisions?: No Patient able to express need for assistance with ADLs?: Yes Does the patient have difficulty dressing or bathing?: Yes Independently performs ADLs?: No Communication: Independent Dressing (OT): Needs assistance Feeding: Independent Bathing: Needs assistance Toileting: Needs assistance In/Out Bed: Needs assistance Walks in Home: Needs assistance Does the patient have difficulty walking or climbing stairs?: Yes Weakness of Legs: Both Weakness of Arms/Hands: None  Permission Sought/Granted                  Emotional Assessment Appearance:: Appears stated age Attitude/Demeanor/Rapport: Engaged Affect (typically observed): Appropriate Orientation: : Oriented to Self, Oriented to Place, Oriented to  Time, Oriented to Situation Alcohol / Substance Use: Tobacco Use, Alcohol Use, Illicit Drugs(Patient reported that she quit smoking and does not drink or use illicit drugs)  Psych Involvement: Yes (comment)  Admission diagnosis:  Weakness [R53.1] ESRD (end stage renal disease) ([place]) [N18.6] Acute cystitis without hematuria [N30.00] Weakness of both lower extremities [R29.898] Incontinence of feces, unspecified fecal incontinence type [R15.9] Patient Active Problem List    Diagnosis Date Noted  . Loose stools   . Acute cystitis without hematuria   . Weakness   . Weakness of both lower extremities   . Debility 09/10/2019  . ESRD (end stage renal disease) on dialysis ([place]) 09/10/2019  . Depression 09/10/2019  . Incontinence in female 09/10/2019  . Pressure ulcer 09/10/2019  . Anemia in chronic kidney disease 08/19/2019  . Accelerated hypertension 08/08/2019  . Right arm weakness 08/08/2019  . MGUS (monoclonal gammopathy of unknown significance) 07/23/2019  . Diarrhea   . AKI (acute kidney injury) ([place]) 02/28/2019  . Acute on chronic renal failure ([place]) 02/28/2019  . GERD (gastroesophageal reflux disease) 02/28/2019  . Closed fracture of left femur ([place]) 10/28/2018  . Hypertension associated with diabetes ([place]) 10/28/2018  . Sprain of left ankle   . Emphysematous cystitis 08/26/2018  . Vaginosis 08/26/2018  . Hypokalemia 08/26/2018  . Normocytic anemia 08/26/2018  . Closed fracture of right ankle 11/06/2017  . IRREGULAR MENSES 09/14/2009  . BURN, FIRST DEGREE, ARM 09/14/2009  . HYPERCHOLESTEROLEMIA 11/19/2008  . PARONYCHIA, RIGHT GREAT TOE 07/30/2008  . ABSCESS, TOOTH 03/31/2008  . Type 2 diabetes with nephropathy ([place]) 07/02/2007  . CATARACTS, BILATERAL 07/02/2007   PCP:  Health, [place]:   [facility] Dept. Linna Hoff, [place] [address] [place]  [address] Phone: [phone] Fax: [place], [place] [address] [place] [address] Phone: [phone] Fax: [phone]     Social Determinants of Health (SDOH) Interventions    Readmission Risk Interventions No flowsheet data found.

## 2019-09-17 NOTE — Progress Notes (Signed)
Physical Therapy Treatment Patient Details Name: Melanie Hall MRN: [id number] DOB: December 29, 1970 Today's Date: 09/17/2019    History of Present Illness 49 year old female with longstanding history of diabetes mellitus, end-stage renal disease on hemodialysis, hypertension presented to [facility] emergency department with 4 weeks history of progressive bilateral lower extremity weakness, numbness, bowel and bladder incontinence.    PT Comments    Pt tolerated short-distance gait training this session, pt presenting with steppage gait requiring PT cuing to increase bilateral foot clearance. Pt tolerated limited LE exercises and stretches in bed to promote strength and appropriate muscle length. PT to continue to follow acutely.    Follow Up Recommendations  SNF     Equipment Recommendations  None recommended by PT    Recommendations for Other Services       Precautions / Restrictions Precautions Precautions: Fall Restrictions Weight Bearing Restrictions: No    Mobility  Bed Mobility Overal bed mobility: Needs Assistance Bed Mobility: Sit to Supine Rolling: Modified independent (Device/Increase time)   Supine to sit: Modified independent (Device/Increase time)   Sit to sidelying: Supervision;HOB elevated General bed mobility comments: supervision for safety, use of bedrails and increased time. pt up in chair upon PT arrival.  Transfers Overall transfer level: Needs assistance Equipment used: Rolling walker (2 wheeled) Transfers: Sit to/from Stand Sit to Stand: Min assist Stand pivot transfers: Min assist       General transfer comment: min assist for completion of rise, steadying. Increased time to power up from recliner, pt with proper hand placement.  Ambulation/Gait Ambulation/Gait assistance: Min guard Gait Distance (Feet): 12 Feet Assistive device: Rolling walker (2 wheeled) Gait Pattern/deviations: Step-through pattern;Decreased stride length;Steppage;Decreased  dorsiflexion - left;Decreased dorsiflexion - right Gait velocity: decr   General Gait Details: Min guard for safety, verbal cuing for increased hip and knee flexion due to bilateral decreased DF. Pt ambulates with steppage-like gait, but pt states her sensation in BLEs "is fine".   Stairs             Wheelchair Mobility    Modified Rankin (Stroke Patients Only)       Balance Overall balance assessment: Needs assistance Sitting-balance support: Feet supported Sitting balance-Leahy Scale: Good     Standing balance support: Bilateral upper extremity supported Standing balance-Leahy Scale: Poor Standing balance comment: reliant on RW                            Cognition Arousal/Alertness: Awake/alert Behavior During Therapy: Flat affect Overall Cognitive Status: Within Functional Limits for tasks assessed                                 General Comments: Flat affect, verging on irritable at times but passes quickly. Follows one-step commands well      Exercises General Exercises - Lower Extremity Ankle Circles/Pumps: AAROM;Both;10 reps(with ~5 second sustained hold in DF for calf stretch) Short Arc Quad: AROM;Both;10 reps;Supine Other Exercises Other Exercises: ankle pumps Other Exercises: seated leg raises Other Exercises: seated marches    General Comments General comments (skin integrity, edema, etc.): HR post-ambulation 71 bpm      Pertinent Vitals/Pain Pain Assessment: 0-10 Faces Pain Scale: Hurts a little bit Pain Location: LEs Pain Descriptors / Indicators: Tightness Pain Intervention(s): Limited activity within patient's tolerance;Monitored during session;Repositioned    Home Living  Prior Function            PT Goals (current goals can now be found in the care plan section) Acute Rehab PT Goals Patient Stated Goal: to get home and feel better PT Goal Formulation: With patient Time For  Goal Achievement: 09/25/19 Potential to Achieve Goals: Good Progress towards PT goals: Progressing toward goals    Frequency    Min 3X/week(in case she refuses SNF)      PT Plan Current plan remains appropriate    Co-evaluation       OT goals addressed during session: ADL's and self-care      AM-PAC PT "6 Clicks" Mobility   Outcome Measure  Help needed turning from your back to your side while in a flat bed without using bedrails?: A Little Help needed moving from lying on your back to sitting on the side of a flat bed without using bedrails?: A Little Help needed moving to and from a bed to a chair (including a wheelchair)?: A Little Help needed standing up from a chair using your arms (e.g., wheelchair or bedside chair)?: A Little Help needed to walk in hospital room?: A Little Help needed climbing 3-5 steps with a railing? : A Lot 6 Click Score: 17    End of Session Equipment Utilized During Treatment: Gait belt Activity Tolerance: Patient limited by fatigue Patient left: in bed;with call bell/phone within reach;with bed alarm set Nurse Communication: Mobility status PT Visit Diagnosis: Unsteadiness on feet (R26.81);Muscle weakness (generalized) (M62.81)     Time: 9242-6834 PT Time Calculation (min) (ACUTE ONLY): 16 min  Charges:  $Gait Training: 8-22 mins                    Bishop Vanderwerf E, PT [place] Pager [phone]  Office [phone]   Roxine Caddy D Elonda Husky 09/17/2019, 2:23 PM

## 2019-09-17 NOTE — Progress Notes (Addendum)
Occupational Therapy Treatment Patient Details Name: Melanie Hall MRN: [id number] DOB: March 14, 1971 Today's Date: 09/17/2019    History of present illness 49 year old female with longstanding history of diabetes mellitus, end-stage renal disease on hemodialysis, hypertension presented to [facility] emergency department with 4 weeks history of progressive bilateral lower extremity weakness, numbness, bowel and bladder incontinence.   OT comments  Pt progressing with OT goals with improved mobility noted this session. Pt received in bed, reporting bowel incontinence. Pt Total A for cleanup after bowel incontinence, but Modified Independent for rolling side to side during task with use of bed rails. Pt Modified Independent to sit EOB with use of bed rails and increased time. Pt Max A to don socks sitting EOB - pt reports unable to complete this task, but did not attempt to don socks. Pt min guard for sit to stand with RW, experiencing diarrhea again, Total A for posterior hygiene in standing with RW. Pt Min A for stand pivot to recliner chair with RW, one LOB with Min A to correct. Pt completed activities on RA without signs of SOB or respiratory distress, HR WFL during session. Continue to recommend SNF for short term rehab prior to DC home. Will continue to follow acutely.    Follow Up Recommendations  SNF    Equipment Recommendations  Other (comment)    Recommendations for Other Services      Precautions / Restrictions Precautions Precautions: Fall Restrictions Weight Bearing Restrictions: No       Mobility Bed Mobility Overal bed mobility: Modified Independent Bed Mobility: Rolling;Sidelying to Sit Rolling: Modified independent (Device/Increase time)   Supine to sit: Modified independent (Device/Increase time)     General bed mobility comments: Pt very mobile in bed, no assistance needed   Transfers Overall transfer level: Needs assistance Equipment used: Rolling walker (2  wheeled) Transfers: Sit to/from [facility] Sit to Stand: Min guard Stand pivot transfers: Min assist       General transfer comment: min guard for sit to stand for 2 trials with RW. Pt Min A for stand pivot to recliner chair with Min A to maintain and correct balance     Balance Overall balance assessment: Needs assistance Sitting-balance support: Bilateral upper extremity supported Sitting balance-Leahy Scale: Good     Standing balance support: Bilateral upper extremity supported Standing balance-Leahy Scale: Poor Standing balance comment: Instance of LOB with RW during transfer, Min A to correct                            ADL either performed or assessed with clinical judgement   ADL Overall ADL's : Needs assistance/impaired                     Lower Body Dressing: Maximal assistance;Sitting/lateral leans Lower Body Dressing Details (indicate cue type and reason): Max A to don socks. Pt reports unable, but did not attempt task      Toileting- Clothing Manipulation and Hygiene: Total assistance;Bed level Toileting - Clothing Manipulation Details (indicate cue type and reason): Pt Total A for peri care due to multiple bouts of bowel incontinence              Vision       Perception     Praxis      Cognition Arousal/Alertness: Awake/alert Behavior During Therapy: WFL for tasks assessed/performed Overall Cognitive Status: Within Functional Limits for tasks assessed  Exercises Exercises: Other exercises(pt inquired about LE exercises to assist w/ tightness ) Other Exercises Other Exercises: ankle pumps Other Exercises: seated leg raises Other Exercises: seated marches   Shoulder Instructions       General Comments HR 70s during session, completed activity on RA with no signs of SOB/distress.     Pertinent Vitals/ Pain       Pain Assessment: No/denies pain  Home  Living                                          Prior Functioning/Environment              Frequency  Min 2X/week        Progress Toward Goals  OT Goals(current goals can now be found in the care plan section)  Progress towards OT goals: Progressing toward goals  Acute Rehab OT Goals Patient Stated Goal: to get home and feel better OT Goal Formulation: With patient Time For Goal Achievement: 09/25/19 Potential to Achieve Goals: Good ADL Goals Pt Will Perform Grooming: with supervision;with set-up;sitting Pt Will Perform Upper Body Bathing: with min guard assist;sitting Pt Will Perform Lower Body Bathing: with min assist;sit to/from stand Pt Will Perform Upper Body Dressing: with set-up;with supervision;sitting Pt Will Perform Lower Body Dressing: with set-up;with supervision;sit to/from stand Pt Will Transfer to Toilet: with min assist;stand pivot transfer;ambulating;bedside commode Pt Will Perform Toileting - Clothing Manipulation and hygiene: with min assist;sit to/from stand Pt/caregiver will Perform Home Exercise Program: Increased strength;Both right and left upper extremity;With Supervision  Plan Discharge plan remains appropriate    Co-evaluation          OT goals addressed during session: ADL's and self-care      AM-PAC OT "6 Clicks" Daily Activity     Outcome Measure   Help from another person eating meals?: None Help from another person taking care of personal grooming?: A Little Help from another person toileting, which includes using toliet, bedpan, or urinal?: Total Help from another person bathing (including washing, rinsing, drying)?: A Lot Help from another person to put on and taking off regular upper body clothing?: A Little Help from another person to put on and taking off regular lower body clothing?: A Lot 6 Click Score: 15    End of Session Equipment Utilized During Treatment: Gait belt;Rolling walker;Oxygen  OT  Visit Diagnosis: Unsteadiness on feet (R26.81);Other abnormalities of gait and mobility (R26.89);Muscle weakness (generalized) (M62.81)   Activity Tolerance Patient tolerated treatment well   Patient Left in chair;with call bell/phone within reach;with chair alarm set   Nurse Communication Mobility status        Time: 0922-0959 OT Time Calculation (min): 37 min  Charges: OT General Charges $OT Visit: 1 Visit OT Treatments $Self Care/Home Management : 8-22 mins $Therapeutic Activity: 8-22 mins  Layla Maw, OTR/L   Layla Maw 09/17/2019, 1:26 PM

## 2019-09-17 NOTE — Progress Notes (Signed)
PROGRESS NOTE    Melanie Hall  MOQ:[id number] DOB: 1970-08-02 DOA: 09/09/2019 PCP: [facility] [place]   Brief Narrative: 49 year old female with history of hypertension, diabetes, ESRD recently started on dialysis presented with weakness.  Patient is started HD 4 weeks PTA and around the same time inability to walk with generalized weakness.  Numbness and tingling in her arms and legs and also urinary and fecal incontinence.  As per daughter patient has been in and out of hospital for 2 to 3 weeks, during the time she was hospitalized she went and got a wants to walk and has been unable to walk since.  [facility] has been helping at home and there is concerns about her inability to walk.  Patient is not taking medication, not on insulin and daughter is taking her to dialysis Monday Wednesday Friday and otherwise only eats fast food does not want to eat  right.  Strong history of  mental health disorder in the family and daughter agreed for psychiatry evaluation. In the ER work-up showed UTI, MRI was recommended for further evaluation along with neurology and patient was admitted.  Patient was seen by neurology, nephrology.  Patient underwent further work-up TSH 4.5, sed rate 45, A1c 10.2 B12 818.  Patient had further imaging . BACK IN feb she had  CT head, CT lumbar spine, CT cervical spine.  During this admission she underwent MRI of cervical spine lumbar spine thoracic spine as resulted below.  See needed Anastassia for  thoracic and lumbar MRI-MRI scan did not show any structural abnormalities.  Neurology feels likely from polyradiculopathy 2/2 standing diabetes mellitus and have advised no further inpatient work-up and follow-up with neurology as outpatient for EMG and continue PT OT.  Subjective: Seen and examined. Working with physical therapy this morning. We will remove her upper extremities lower extremities complaints numbness in lower extremities No acute events overnight.  Blood  sugar at 87 this morning.  Assessment & Plan:  Inability to walk/numbness tingling/fecal and urinary incontinence/debility: Patient is able to move her lower extremities and upper extremities.Had extensive work-up per neurology including ESR B12, CRP 8 5, RBC folate at 1312, TSH 3.2.  MRI cervical lumbar and thoracic spine-did not show any structural abnormalities.Suspecting polyradiculopathy likely from longstanding uncontrolled diabetes resulting in patient's numbness.  Continue PT OT and will need outpatient neurology evaluation and  nerve conduction EMG.  Neurology signed off.  Continue Neurontin  per neurology.  ESRD on HD MWF: Recently started on HD 4 weeks ago. Last HD 3/10 . Appreciate nephrology follow-up.  Type 2 diabetes with nephropathy: HBA1c 7.2.blood sugar fluctuating uncontrolled ranging 87-240s. Previously in 400s.  Lantus was resumed at lower dose/bid improvement in her blood sugar, cont on ssi.  Continue insulin and monitor. Recent Labs  Lab 09/16/19 0718 09/16/19 1241 09/16/19 1639 09/16/19 2048 09/17/19 0656  GLUCAP 327* 158* 220* 246* 87   Anemia of renal disease.  Hemoglobin is stable at 9 g range.  Monitor  HLD/HTN: Acceptable today, was high 3/10 IN 180S around dialysis. Continue amlodipine and Coreg and monitor and uptitrate medication.  Vaginal candidiasis status post Diflucan.  Continue clotrimazole cream nightly.  Depression: Mood is overall stable. Concerned that patient's symptoms may have a psychogenic component psychiatric consultation obtained and fail patient did not meet inpatient criteria and recommended social worker follow-up with daughter due to voiced concern.  Started on Zoloft per psych recommendation.  Hypothyroidism euthyroid, continue Synthroid  Acute cystitis without hematuria: received a dose  of ceftriaxone in the ER.  Urine culture multiple species.  And asymptomatic no fever no leukocytosis.   Loose stool with incontinence.  C. difficile  negative.  Symptoms improved.  Gen Weakness/debility: Continue PT OT.  Skilled nursing.  Placement.   Body mass index is 28.55 kg/m.   DVT prophylaxis:Heparin Code Status:FUL  Family Communication: plan of care discussed with patient at bedside. Disposition Plan: Patient is from:HOME Anticipated Disposition: to  SNF Barriers to discharge or conditions that needs to be met prior to discharge: Admitted with inability to walk bilateral lower extremity numbness/bowel bladder incontinence for 4 wks, after starting HD around same time- completed extensive work-up seen by neurology, at this time suspecting polyradicular neuropathy.  She will need skilled nursing facility placement.  She is medically stable.COVID 19 Neg from 3/9  Consultants: NEPHROLOGY/NEUROLOGY Procedures:  MRI of cervical spine --shows no acute or focal lesion to explain the patient's symptoms.  Straightening of the normal cervical lordosis is nonspecific, but can seen in the setting of muscle strain or ongoing pain.  MR Lumbar and THORACIC SPINE WO CONTRAST  Result Date: 09/15/2019  IMPRESSION: MR THORACIC SPINE IMPRESSION Negative for thoracic fracture or mass. No significant stenosis in the thoracic spine. MR LUMBAR SPINE IMPRESSION Moderately severe compression fracture L1 is stable from the CT of 08/23/2019. No retropulsion of bone into the canal and no spinal stenosis at this level. No significant spinal stenosis in the remainder of the lumbar spine Improving left perinephric hematoma following  renal biopsy. Electronically Signed   By: Franchot Gallo M.D.   On: 09/15/2019 14:05   Microbiology:see note  Medications: Scheduled Meds: . amLODipine  5 mg Oral Daily  . carvedilol  6.25 mg Oral BID WC  . Chlorhexidine Gluconate Cloth  6 each Topical Q0600  . clotrimazole  1 Applicatorful Vaginal QHS  . darbepoetin (ARANESP) injection - DIALYSIS  40 mcg Intravenous Q Mon-HD  . gabapentin  300 mg Oral QHS  . heparin   5,000 Units Subcutaneous Q8H  . insulin aspart  0-5 Units Subcutaneous QHS  . insulin aspart  0-6 Units Subcutaneous TID WC  . insulin glargine  8 Units Subcutaneous BID  . levothyroxine  25 mcg Oral QAC breakfast  . multivitamin  1 tablet Oral QHS  . pantoprazole  40 mg Oral Daily  . sertraline  25 mg Oral Daily   Continuous Infusions: . ferric gluconate (FERRLECIT/NULECIT) IV Stopped (09/16/19 1021)    Antimicrobials: Anti-infectives (From admission, onward)   Start     Dose/Rate Route Frequency Ordered Stop   09/13/19 1445  fluconazole (DIFLUCAN) tablet 150 mg     150 mg Oral  Once 09/13/19 1435 09/13/19 1807   09/11/19 0230  cefTRIAXone (ROCEPHIN) 1 g in sodium chloride 0.9 % 100 mL IVPB     1 g 200 mL/hr over 30 Minutes Intravenous  Once 09/10/19 1021 09/11/19 0330   09/10/19 0245  cefTRIAXone (ROCEPHIN) 1 g in sodium chloride 0.9 % 100 mL IVPB     1 g 200 mL/hr over 30 Minutes Intravenous  Once 09/10/19 0231 09/10/19 0321       Objective: Vitals: Today's Vitals   09/16/19 1640 09/16/19 1737 09/16/19 2049 09/17/19 0537  BP: (!) 154/72  (!) 141/77 (!) 157/76  Pulse: 76  75 70  Resp: '16  19 18  ' Temp: 97.9 F (36.6 C)  97.9 F (36.6 C) 98.2 F (36.8 C)  TempSrc: Oral  Oral Oral  SpO2: 97%  96% 97%  Weight:      Height:      PainSc:  0-No pain 0-No pain     Intake/Output Summary (Last 24 hours) at 09/17/2019 0734 Last data filed at 09/17/2019 0537 Gross per 24 hour  Intake 840 ml  Output 3550 ml  Net -2710 ml   Filed Weights   09/14/19 1710 09/16/19 0741 09/16/19 1152  Weight: 70.3 kg 76.1 kg 73.1 kg   Weight change:    Intake/Output from previous day: 03/10 0701 - 03/11 0700 In: 840 [P.O.:840] Out: 3550 [Urine:550] Intake/Output this shift: No intake/output data recorded.  Examination:  General exam: AAOX3 , thin, pleasant, not in acute distress.   HEENT:Oral mucosa moist, Ear/Nose WNL grossly,dentition normal. Respiratory system: bilaterally  clear,no wheezing or crackles,no use of accessory muscle, non tender. Cardiovascular system: S1 & S2 +, regular, No JVD. Gastrointestinal system: Abdomen soft, NT,ND, BS+. Nervous System:Alert, awake, moving extremities b/l LE and upper- intact sensation Extremities: No edema, distal peripheral pulses palpable.  Skin: No rashes,no icterus. MSK: Normal muscle bulk,tone, power Right chest with HD catheter.  Data Reviewed: I have personally reviewed following labs and imaging studies CBC: Recent Labs  Lab 09/11/19 0626 09/11/19 0626 09/12/19 0349 09/13/19 0434 09/14/19 0434 09/15/19 0344 09/16/19 0409  WBC 6.7  --  6.4  --  5.6 5.1 5.5  NEUTROABS  --   --  3.8  --  3.4 2.7  --   HGB 9.7*   < > 8.8* 8.9* 8.6* 9.0* 9.8*  HCT 32.1*   < > 28.7* 29.0* 28.2* 28.6* 30.9*  MCV 101.9*  --  98.6  --  98.9 96.6 97.2  PLT 148*  --  153  --  154 147* 163   < > = values in this interval not displayed.   Basic Metabolic Panel: Recent Labs  Lab 09/11/19 0626 09/11/19 0626 09/12/19 0349 09/12/19 0349 09/13/19 0434 09/14/19 0434 09/15/19 0344 09/15/19 2124 09/16/19 0409  NA 141   < > 138  --  139 138 135  --  133*  K 4.1   < > 3.7  --  4.5 4.3 4.3  --  5.1  CL 101   < > 100  --  101 101 99  --  98  CO2 25   < > 27  --  '27 26 24  ' --  21*  GLUCOSE 150*   < > 123*   < > 129* 161* 140* 434* 399*  BUN 19   < > 10  --  19 29* 12  --  28*  CREATININE 3.62*   < > 2.58*  --  3.46* 4.68* 2.78*  --  3.93*  CALCIUM 8.7*   < > 8.2*  --  8.3* 8.5* 8.3*  --  8.6*  PHOS 4.0  --  2.9  --  3.8 4.4  --   --   --    < > = values in this interval not displayed.   GFR: Estimated Creatinine Clearance: 16.6 mL/min (A) (by C-G formula based on SCr of 3.93 mg/dL (H)). Liver Function Tests: Recent Labs  Lab 09/12/19 0349 09/13/19 0434 09/14/19 0434  ALBUMIN 2.5* 2.6* 2.6*   No results for input(s): LIPASE, AMYLASE in the last 168 hours. No results for input(s): AMMONIA in the last 168  hours. Coagulation Profile: No results for input(s): INR, PROTIME in the last 168 hours. Cardiac Enzymes: No results for input(s): CKTOTAL, CKMB, CKMBINDEX, TROPONINI in the last 168 hours. BNP (last  3 results) No results for input(s): PROBNP in the last 8760 hours. HbA1C: No results for input(s): HGBA1C in the last 72 hours. CBG: Recent Labs  Lab 09/16/19 0718 09/16/19 1241 09/16/19 1639 09/16/19 2048 09/17/19 0656  GLUCAP 327* 158* 220* 246* 87   Lipid Profile: No results for input(s): CHOL, HDL, LDLCALC, TRIG, CHOLHDL, LDLDIRECT in the last 72 hours. Thyroid Function Tests: Recent Labs    09/15/19 0344  TSH 4.565*   Anemia Panel: No results for input(s): VITAMINB12, FOLATE, FERRITIN, TIBC, IRON, RETICCTPCT in the last 72 hours. Sepsis Labs: No results for input(s): PROCALCITON, LATICACIDVEN in the last 168 hours.  Recent Results (from the past 240 hour(s))  Blood culture (routine x 2)     Status: None   Collection Time: 09/10/19 12:22 AM   Specimen: BLOOD  Result Value Ref Range Status   Specimen Description BLOOD BLOOD RIGHT WRIST  Final   Special Requests   Final    BOTTLES DRAWN AEROBIC AND ANAEROBIC Blood Culture adequate volume   Culture   Final    NO GROWTH 5 DAYS Performed at [facility], [address], [place], [place] [address]    Report Status 09/15/2019 FINAL  Final  Blood culture (routine x 2)     Status: None   Collection Time: 09/10/19 12:28 AM   Specimen: BLOOD  Result Value Ref Range Status   Specimen Description BLOOD RIGHT ANTECUBITAL  Final   Special Requests   Final    BOTTLES DRAWN AEROBIC AND ANAEROBIC Blood Culture adequate volume   Culture   Final    NO GROWTH 5 DAYS Performed at [facility], [address], [place], [place] [address]    Report Status 09/15/2019 FINAL  Final  Urine Culture     Status: Abnormal   Collection Time: 09/10/19  1:29 AM   Specimen: Urine, Random  Result Value Ref Range Status   Specimen Description    Final    URINE, RANDOM Performed at [facility], [address] [place], [place] [address]    Special Requests   Final    NONE Performed at [facility], [address], [place], [place] [address]    Culture MULTIPLE SPECIES PRESENT, SUGGEST RECOLLECTION (A)  Final   Report Status 09/11/2019 FINAL  Final  Respiratory Panel by RT PCR (Flu A&B, Covid) - Nasopharyngeal Swab     Status: None   Collection Time: 09/10/19  1:29 AM   Specimen: Nasopharyngeal Swab  Result Value Ref Range Status   SARS Coronavirus 2 by RT PCR NEGATIVE NEGATIVE Final    Comment: (NOTE) SARS-CoV-2 target nucleic acids are NOT DETECTED. The SARS-CoV-2 RNA is generally detectable in upper respiratoy specimens during the acute phase of infection. The lowest concentration of SARS-CoV-2 viral copies this assay can detect is 131 copies/mL. A negative result does not preclude SARS-Cov-2 infection and should not be used as the sole basis for treatment or other patient management decisions. A negative result may occur with  improper specimen collection/handling, submission of specimen other than nasopharyngeal swab, presence of viral mutation(s) within the areas targeted by this assay, and inadequate number of viral copies (<131 copies/mL). A negative result must be combined with clinical observations, patient history, and epidemiological information. The expected result is Negative. Fact Sheet for Patients:  PinkCheek.be Fact Sheet for Healthcare Providers:  GravelBags.it This test is not yet ap proved or cleared by the [place] FDA and  has been authorized for detection and/or diagnosis of SARS-CoV-2  by FDA under an Emergency Use Authorization (EUA). This EUA will remain  in effect (meaning this test can be used) for the duration of the COVID-19 declaration under Section 564(b)(1) of the Act, 21 U.S.C. section 360bbb-3(b)(1), unless the authorization is  terminated or revoked sooner.    Influenza A by PCR NEGATIVE NEGATIVE Final   Influenza B by PCR NEGATIVE NEGATIVE Final    Comment: (NOTE) The Xpert Xpress SARS-CoV-2/FLU/RSV assay is intended as an aid in  the diagnosis of influenza from Nasopharyngeal swab specimens and  should not be used as a sole basis for treatment. Nasal washings and  aspirates are unacceptable for Xpert Xpress SARS-CoV-2/FLU/RSV  testing. Fact Sheet for Patients: PinkCheek.be Fact Sheet for Healthcare Providers: GravelBags.it This test is not yet approved or cleared by the [place] FDA and  has been authorized for detection and/or diagnosis of SARS-CoV-2 by  FDA under an Emergency Use Authorization (EUA). This EUA will remain  in effect (meaning this test can be used) for the duration of the  Covid-19 declaration under Section 564(b)(1) of the Act, 21  U.S.C. section 360bbb-3(b)(1), unless the authorization is  terminated or revoked. Performed at [facility], [address], [place], [place] [address]   C difficile quick scan w PCR reflex     Status: None   Collection Time: 09/15/19  1:56 AM   Specimen: STOOL  Result Value Ref Range Status   C Diff antigen NEGATIVE NEGATIVE Final   C Diff toxin NEGATIVE NEGATIVE Final   C Diff interpretation No C. difficile detected.  Final    Comment: Performed at [facility], [place] [address], [place], [place] [address]  SARS CORONAVIRUS 2 (TAT 6-24 HRS) Nasopharyngeal Nasopharyngeal Swab     Status: None   Collection Time: 09/15/19  9:27 PM   Specimen: Nasopharyngeal Swab  Result Value Ref Range Status   SARS Coronavirus 2 NEGATIVE NEGATIVE Final    Comment: (NOTE) SARS-CoV-2 target nucleic acids are NOT DETECTED. The SARS-CoV-2 RNA is generally detectable in upper and lower respiratory specimens during the acute phase of infection. Negative results do not preclude SARS-CoV-2 infection, do not rule  out co-infections with other pathogens, and should not be used as the sole basis for treatment or other patient management decisions. Negative results must be combined with clinical observations, patient history, and epidemiological information. The expected result is Negative. Fact Sheet for Patients: SugarRoll.be Fact Sheet for Healthcare Providers: https://www.woods-mathews.com/ This test is not yet approved or cleared by the [place] FDA and  has been authorized for detection and/or diagnosis of SARS-CoV-2 by FDA under an Emergency Use Authorization (EUA). This EUA will remain  in effect (meaning this test can be used) for the duration of the COVID-19 declaration under Section 56 4(b)(1) of the Act, 21 U.S.C. section 360bbb-3(b)(1), unless the authorization is terminated or revoked sooner. Performed at [facility], [place] [address] [place], [place] [address]       Radiology Studies: MR THORACIC SPINE WO CONTRAST  Result Date: 09/15/2019 CLINICAL DATA:  Low back pain. Rule out cauda equina syndrome. Known L1 fracture. EXAM: MRI THORACIC AND LUMBAR SPINE WITHOUT CONTRAST TECHNIQUE: Multiplanar and multiecho pulse sequences of the thoracic and lumbar spine were obtained without intravenous contrast. COMPARISON:  CT lumbar spine 08/23/2019 FINDINGS: MRI THORACIC SPINE FINDINGS Alignment:  Normal. Vertebrae: Normal bone marrow. No fracture or mass in the thoracic spine. L1 fracture described below. Cord:  Normal cord signal.  No cord  lesion or cord compression Paraspinal and other soft tissues: Large right pleural effusion and moderate left pleural effusion with compressive atelectasis in both lungs right greater than left. Disc levels: No significant stenosis in the thoracic spine. Small right-sided disc protrusion T6-7. Mild facet degeneration lower thoracic spine. No significant foraminal stenosis. MRI LUMBAR SPINE FINDINGS Segmentation:   Normal Alignment:  Normal Vertebrae: Moderately severe compression fracture of L1 vertebral body with diffuse bone marrow edema. Fracture is similar to the prior CT. No retropulsion of bone into the canal and no spinal stenosis. Conus medullaris and cauda equina: Conus extends to the L2 level. Conus and cauda equina appear normal. Paraspinal and other soft tissues: Mild paraspinous edema around the L1 compression fracture. No retroperitoneal mass. Small left perinephric hematoma is improved from the CT of 08/13/2019. This is related to an earlier renal biopsy. 8 x 10 mm hemorrhagic cyst or hematoma in the left lower pole likely the biopsy site. Disc levels: T12-L1: Negative L1-2: Negative L2-3: Negative L3-4: Negative L4-5: Negative L5-S1: Normal disc space. Mild facet degeneration. Negative for stenosis. IMPRESSION: MR THORACIC SPINE IMPRESSION Negative for thoracic fracture or mass. No significant stenosis in the thoracic spine. MR LUMBAR SPINE IMPRESSION Moderately severe compression fracture L1 is stable from the CT of 08/23/2019. No retropulsion of bone into the canal and no spinal stenosis at this level. No significant spinal stenosis in the remainder of the lumbar spine Improving left perinephric hematoma following  renal biopsy. Electronically Signed   By: Franchot Gallo M.D.   On: 09/15/2019 14:05   MR LUMBAR SPINE WO CONTRAST  Result Date: 09/15/2019 CLINICAL DATA:  Low back pain. Rule out cauda equina syndrome. Known L1 fracture. EXAM: MRI THORACIC AND LUMBAR SPINE WITHOUT CONTRAST TECHNIQUE: Multiplanar and multiecho pulse sequences of the thoracic and lumbar spine were obtained without intravenous contrast. COMPARISON:  CT lumbar spine 08/23/2019 FINDINGS: MRI THORACIC SPINE FINDINGS Alignment:  Normal. Vertebrae: Normal bone marrow. No fracture or mass in the thoracic spine. L1 fracture described below. Cord:  Normal cord signal.  No cord lesion or cord compression Paraspinal and other soft tissues:  Large right pleural effusion and moderate left pleural effusion with compressive atelectasis in both lungs right greater than left. Disc levels: No significant stenosis in the thoracic spine. Small right-sided disc protrusion T6-7. Mild facet degeneration lower thoracic spine. No significant foraminal stenosis. MRI LUMBAR SPINE FINDINGS Segmentation:  Normal Alignment:  Normal Vertebrae: Moderately severe compression fracture of L1 vertebral body with diffuse bone marrow edema. Fracture is similar to the prior CT. No retropulsion of bone into the canal and no spinal stenosis. Conus medullaris and cauda equina: Conus extends to the L2 level. Conus and cauda equina appear normal. Paraspinal and other soft tissues: Mild paraspinous edema around the L1 compression fracture. No retroperitoneal mass. Small left perinephric hematoma is improved from the CT of 08/13/2019. This is related to an earlier renal biopsy. 8 x 10 mm hemorrhagic cyst or hematoma in the left lower pole likely the biopsy site. Disc levels: T12-L1: Negative L1-2: Negative L2-3: Negative L3-4: Negative L4-5: Negative L5-S1: Normal disc space. Mild facet degeneration. Negative for stenosis. IMPRESSION: MR THORACIC SPINE IMPRESSION Negative for thoracic fracture or mass. No significant stenosis in the thoracic spine. MR LUMBAR SPINE IMPRESSION Moderately severe compression fracture L1 is stable from the CT of 08/23/2019. No retropulsion of bone into the canal and no spinal stenosis at this level. No significant spinal stenosis in the remainder of the lumbar spine  Improving left perinephric hematoma following  renal biopsy. Electronically Signed   By: Franchot Gallo M.D.   On: 09/15/2019 14:05    LOS: 7 days   Time spent: More than 50% of that time was spent in counseling and/or coordination of care.  Antonieta Pert, MD [facility]  09/17/2019, 7:34 AM

## 2019-09-18 LAB — RENAL FUNCTION PANEL
Albumin: 2.6 g/dL — ABNORMAL LOW (ref 3.5–5.0)
Anion gap: 11 (ref 5–15)
BUN: 30 mg/dL — ABNORMAL HIGH (ref 6–20)
CO2: 25 mmol/L (ref 22–32)
Calcium: 8.5 mg/dL — ABNORMAL LOW (ref 8.9–10.3)
Chloride: 100 mmol/L (ref 98–111)
Creatinine, Ser: 3.76 mg/dL — ABNORMAL HIGH (ref 0.44–1.00)
GFR calc Af Amer: 15 mL/min — ABNORMAL LOW (ref >60)
GFR calc non Af Amer: 13 mL/min — ABNORMAL LOW (ref >60)
Glucose, Bld: 90 mg/dL (ref 70–99)
Phosphorus: 2.8 mg/dL (ref 2.5–4.6)
Potassium: 4.7 mmol/L (ref 3.5–5.1)
Sodium: 136 mmol/L (ref 135–145)

## 2019-09-18 LAB — GI PATHOGEN PANEL BY PCR, STOOL
Adenovirus F 40/41: NOT DETECTED
Astrovirus: NOT DETECTED
Campylobacter by PCR: NOT DETECTED
Cryptosporidium by PCR: NOT DETECTED
Cyclospora cayetanensis: NOT DETECTED
E coli (ETEC) LT/ST: NOT DETECTED
E coli (STEC): NOT DETECTED
E coli 0157 by PCR: NOT DETECTED
Entamoeba histolytica: NOT DETECTED
Enteroaggregative E coli: NOT DETECTED
Enteropathogenic E coli: NOT DETECTED
G lamblia by PCR: NOT DETECTED
Norovirus GI/GII: NOT DETECTED
Plesiomonas shigelloides: NOT DETECTED
Rotavirus A by PCR: NOT DETECTED
Salmonella by PCR: NOT DETECTED
Sapovirus: NOT DETECTED
Shigella by PCR: NOT DETECTED
Vibrio cholerae: NOT DETECTED
Vibrio: NOT DETECTED
Yersinia enterocolitica: NOT DETECTED

## 2019-09-18 LAB — CBC
HCT: 29 % — ABNORMAL LOW (ref 36.0–46.0)
Hemoglobin: 8.7 g/dL — ABNORMAL LOW (ref 12.0–15.0)
MCH: 30.1 pg (ref 26.0–34.0)
MCHC: 30 g/dL (ref 30.0–36.0)
MCV: 100.3 fL — ABNORMAL HIGH (ref 80.0–100.0)
Platelets: 162 10*3/uL (ref 150–400)
RBC: 2.89 MIL/uL — ABNORMAL LOW (ref 3.87–5.11)
RDW: 16 % — ABNORMAL HIGH (ref 11.5–15.5)
WBC: 6 10*3/uL (ref 4.0–10.5)
nRBC: 0 % (ref 0.0–0.2)

## 2019-09-18 LAB — GLUCOSE, CAPILLARY
Glucose-Capillary: 113 mg/dL — ABNORMAL HIGH (ref 70–99)
Glucose-Capillary: 135 mg/dL — ABNORMAL HIGH (ref 70–99)
Glucose-Capillary: 232 mg/dL — ABNORMAL HIGH (ref 70–99)

## 2019-09-18 MED ORDER — HEPARIN SODIUM (PORCINE) 1000 UNIT/ML IJ SOLN
INTRAMUSCULAR | Status: AC
  Administered 2019-09-18: 4000 [IU]
  Filled 2019-09-18: qty 4

## 2019-09-18 MED ORDER — TAMSULOSIN HCL 0.4 MG PO CAPS
0.4000 mg | ORAL_CAPSULE | Freq: Every day | ORAL | Status: DC
  Administered 2019-09-18: 0.4 mg via ORAL
  Filled 2019-09-18 (×2): qty 1

## 2019-09-18 NOTE — TOC Progression Note (Addendum)
Transition of Care [facility]) - Progression Note    Patient Details  Name: Melanie Hall MRN: [id number] Date of Birth: 1970/07/12  Transition [facility]) CM/SW Contact  Bartholomew Crews, RN Phone Number: [phone] 09/18/2019, 2:35 PM  Clinical Narrative:    Spoke with patient at the bedside. Discussed financial counselor working on her Insurance underwriter. Patient signed forms allowing financial counselors to act on her behalf for medicaid.   Discussed transition home. Patient stated no one told her that she would be going home today, and her daughter is not prepared stating that she needs a day or two.   Discussed her need for indwelling catheter, and her follow with a urology doctor at [facility]. NCM will refer for [facility] RN, PT charity assist - no charity HH available at this time.   Patient states that she lives at address in [place] with her daughter and a roommate. States that her daughter takes her to/from hemodialysis. Stated that she had been getting around with a walker, but had recently been more reliant on her wheelchair, however, this hospitalization she has been using her walker more.   TOC team following for transition needs.    Expected Discharge Plan: Home/Self Care(Patient without insurance) Barriers to Discharge: Continued Medical Work up  Expected Discharge Plan and Services Expected Discharge Plan: Home/Self Care(Patient without insurance) In-house Referral: Clinical Social Work                                             Social Determinants of Health (SDOH) Interventions    Readmission Risk Interventions No flowsheet data found.

## 2019-09-18 NOTE — Progress Notes (Signed)
Urology was called by Dr. Lupita Leash for outpatient urology follow up.  49 yo woman with hx of ESRD on diaylsis who still makes urine.  Her bladder hasb been managed by intermittent catheterization in the hospital with volumes 300-728mL.  Patient is being discharged and cannot self-catheterize.  Primary team is placing indwelling foley and would like outpatient urology follow up at [facility].  Patient will likely need discussion of bladder management as an outpatient and possible further testing with urodynamics.

## 2019-09-18 NOTE — Progress Notes (Signed)
Physical Therapy Treatment Patient Details Name: Melanie Hall MRN: [id number] DOB: 1971/05/14 Today's Date: 09/18/2019    History of Present Illness 49 year old female with longstanding history of diabetes mellitus, end-stage renal disease on hemodialysis, hypertension presented to [facility] emergency department with 4 weeks history of progressive bilateral lower extremity weakness, numbness, bowel and bladder incontinence.    PT Comments    Despite her challenges, pt is not in agreement with going to rehab before home.  Has three steps to enter her daughter's house and declined to attempt today.  Will expect her to need to attempt this but has a plan to dc over the weekend.  Will see if pt can be placed on WE schedule for this, and will probably refuse to attempt it as she feels she will be fine getting in the house.  While pt cannot verbalize what help she will have to ascend three steps, she is also declining the help to practice.  Follow up as pt will allow.   Follow Up Recommendations  SNF     Equipment Recommendations  None recommended by PT    Recommendations for Other Services       Precautions / Restrictions Precautions Precautions: Fall Precaution Comments: 1-2 assist Restrictions Weight Bearing Restrictions: No    Mobility  Bed Mobility Overal bed mobility: Needs Assistance Bed Mobility: (scooting up )         Sit to sidelying: Supervision General bed mobility comments: pt is able to use LE's to help  Transfers                 General transfer comment: declines  Ambulation/Gait             General Gait Details: declines   Stairs             Wheelchair Mobility    Modified Rankin (Stroke Patients Only)       Balance                                            Cognition Arousal/Alertness: Awake/alert Behavior During Therapy: Flat affect Overall Cognitive Status: Within Functional Limits for tasks assessed                                  General Comments: pt is very calm, determined about going home despite plan  rehab recommends      Exercises General Exercises - Lower Extremity Ankle Circles/Pumps: AROM;10 reps;AAROM Quad Sets: AROM;10 reps Heel Slides: AROM;10 reps Hip ABduction/ADduction: AAROM;10 reps Straight Leg Raises: AAROM;10 reps Hip Flexion/Marching: AROM;10 reps    General Comments        Pertinent Vitals/Pain Pain Assessment: Faces Faces Pain Scale: Hurts a little bit Pain Location: LEs Pain Descriptors / Indicators: Tightness    Home Living                      Prior Function            PT Goals (current goals can now be found in the care plan section) Acute Rehab PT Goals Patient Stated Goal: to get home and feel better    Frequency    Min 3X/week      PT Plan Current plan remains appropriate    Co-evaluation  AM-PAC PT "6 Clicks" Mobility   Outcome Measure  Help needed turning from your back to your side while in a flat bed without using bedrails?: A Little Help needed moving from lying on your back to sitting on the side of a flat bed without using bedrails?: A Little Help needed moving to and from a bed to a chair (including a wheelchair)?: A Little Help needed standing up from a chair using your arms (e.g., wheelchair or bedside chair)?: A Little Help needed to walk in hospital room?: A Little Help needed climbing 3-5 steps with a railing? : A Lot 6 Click Score: 17    End of Session   Activity Tolerance: Patient limited by fatigue Patient left: in bed;with call bell/phone within reach;with bed alarm set Nurse Communication: Mobility status PT Visit Diagnosis: Unsteadiness on feet (R26.81);Muscle weakness (generalized) (M62.81)     Time: 3524-8185 PT Time Calculation (min) (ACUTE ONLY): 23 min  Charges:  $Therapeutic Exercise: 8-22 mins $Therapeutic Activity: 8-22 mins                    Ramond Dial 09/18/2019, 4:40 PM  Mee Hives, PT MS Acute Rehab Dept. Number: [place] and [place]

## 2019-09-18 NOTE — Progress Notes (Signed)
PROGRESS NOTE    Melanie Hall  AUQ:[id number] DOB: 1971-07-09 DOA: 09/09/2019 PCP: [facility] course narratie: 49 year old female with history of hypertension, diabetes, ESRD recently started on dialysis presented with weakness.  Patient is started HD 4 weeks PTA and around the same time inability to walk with generalized weakness.  Numbness and tingling in her arms and legs and also urinary and fecal incontinence.  As per daughter patient has been in and out of hospital for 2 to 3 weeks, during the time she was hospitalized she went and got a wants to walk and has been unable to walk since.  [facility] has been helping at home and there is concerns about her inability to walk.  Patient is not taking medication, not on insulin and daughter is taking her to dialysis Monday Wednesday Friday and otherwise only eats fast food does not want to eat  right.  Strong history of  mental health disorder in the family and daughter agreed for psychiatry evaluation. In the ER work-up showed UTI, MRI was recommended for further evaluation along with neurology and patient was admitted.Patient was seen by neurology, nephrology.  Patient underwent further work-up TSH 4.5, sed rate 45, A1c 10.2 B12 818.  Patient had further imaging . BACK IN feb she had  CT head, CT lumbar spine, CT cervical spine.During this admission she underwent MRI of cervical spine lumbar spine thoracic spine as resulted below.See needed Melanie Hall for  thoracic and lumbar MRI-MRI scan did not show any structural abnormalities.Neurology feels likely from polyradiculopathy 2/2 standing diabetes mellitus and have advised no further inpatient work-up and follow-up with neurology as outpatient for EMG and continue PT OT.  Patient having some incontinence but however she has been retaining urine unable to void and has been needing in and out catheterization at least once a day.Discussed about doing self cath, appears to be reluctant.   I discussed with Dr. Claudia Hall from urology recommends Foley catheter for 2 weeks and outpatient follow-up and she will set up/arrange appointment at [facility] as patient is from [place].   Assessment & Plan:  Inability to walk/numbness tingling/fecal and urinary incontinence/debility: Patient is able to move her lower extremities and upper extremities.Had extensive work-up per neurology including ESR B12, CRP 8 5, RBC folate at 1312, TSH 3.2.  MRI cervical lumbar and thoracic spine-did not show any structural abnormalities.Suspecting polyradiculopathy likely from longstanding uncontrolled diabetes resulting in patient's numbness. Continue PT OT and will need outpatient neurology evaluation and  nerve conduction EMG.Neurology signed off.Continue Neurontin  per neurology.  Patient needing in and out catheterization we will keep her in Foley cath for 2 weeks and follow-up with urology as o/p. Discussed w Dr Melanie Hall who will set up appointment.  ESRD on HD MWF: Recently started on HD 4 weeks ago. Last HD 3/10 . Appreciate nephrology follow-up.  Type 2 diabetes with nephropathy:HBA1c 7.2.blood sugar fluctuating uncontrolled ranging 87-240s. Previously in 400s.Lantus was resumed at lower dose-bid with improvement in sugar ( cbg was in 400s initially) -patient can resume her home insulin upon discharge.   Recent Labs  Lab 09/17/19 1114 09/17/19 1622 09/17/19 2027 09/18/19 0615 09/18/19 1210  GLUCAP 143* 202* 186* 113* 135*   Anemia of renal disease.  Hemoglobin is stable.Monitor  HLD/HTN: Acceptable somewhat on higher side. Continue amlodipine and Coreg.  Large volume ultra filtration dialysis per nephrology today.  Continue with fluid adjustment dialysis.  Vaginal candidiasis status post Diflucan.  Continue clotrimazole cream nightly.  Depression: Mood is overall stable. Concerned that patient's symptoms may have a psychogenic component psychiatric consultation obtained and fail patient did not meet  inpatient criteria and recommended social worker follow-up with daughter due to voiced concern.  Started on Zoloft per psych recommendation.  Hypothyroidism euthyroid, continue Synthroid  Acute cystitis without hematuria: received a dose of ceftriaxone in the ER.  Urine culture multiple species.  And asymptomatic no fever no leukocytosis.   Loose stool with incontinence.  C. difficile negative.  Symptoms improved.  Gen Weakness/debility: Continue PT OT.  Skilled nursing advised currently unable to be placed due to no insurance.   Body mass index is 27.3 kg/m.   DVT prophylaxis:Heparin Code Status:FUL  Family Communication: plan of care discussed with patient at bedside. Disposition Plan: Patient is from:HOME Anticipated Disposition: to  SNF vs HHC Barriers to discharge or conditions that needs to be met prior to discharge: Admitted with inability to walk bilateral lower extremity numbness/bowel bladder incontinence for 4 wks, after starting HD around same time- completed extensive work-up seen by neurology, at this time suspecting polyradicular neuropathy.PT advised SNF- unable to have SNF due to no payer source. Disposition unsafe- daughter Melanie Hall needing more days to arrange for return to her home.  I called Melanie Hall to discuss no answer. Spoke w CM. anticiapted dispo home with daughter, pt says either Saturday or _0 0 mg Oral  Once 09/13/19 1435 09/13/19 1807   09/11/19 0230  cefTRIAXone (  ROCEPHIN) 1 g in sodium chloride 0.9 % 100 mL IVPB     1 g 200 mL/hr over 30 Minutes Intravenous  Once 09/10/19 1021 09/11/19 0330   09/10/19 0245  cefTRIAXone (ROCEPHIN) 1 g in sodium chloride 0.9 % 100 mL IVPB     1 g 200 mL/hr over 30 Minutes Intravenous  Once 09/10/19 0231 09/10/19 0321       Objective: Vitals: Today's Vitals   09/18/19 1100 09/18/19 1130 09/18/19 1135 09/18/19 1212  BP: (!) 156/79 (!) 175/72 (!)  165/79 (!) 179/87  Pulse: 86 86 85 85  Resp: _0 Temp:   98.7 F (37.1 C) 98.1 F (36.7 C)  TempSrc:   Oral Oral  SpO2:   98% 93%  Weight:   69.9 kg   Height:      PainSc:        Intake/Output Summary (Last 24 hours) at 09/18/2019 1327 Last data filed at 09/18/2019 1235 Gross per 24 hour  Intake 820 ml  Output 2715 ml  Net -1895 ml   Filed Weights   09/17/19 1900 09/18/19 0730 09/18/19 1135  Weight: 72.8 kg 72.2 kg 69.9 kg   Weight change: -3.3 kg   Intake/Output from previous day: 03/11 0701 - 03/12 0700 In: 1220 [P.O.:1220] Out: 0  Intake/Output this shift: Total I/O In: 120 [P.O.:120] Out: 2715 [Other:2715]  Examination:  General exam: AAOx3,NAD,Weak appearing. HEENT:Oral mucosa moist, Ear/Nose WNL grossly, dentition normal. Respiratory system: bilaterally clear,no use of accessory muscle Cardiovascular system: S1 & S2 +, No JVD,. Gastrointestinal system: Abdomen soft, NT,ND, BS+ Nervous System:Alert, awake, moving extremities and grossly nonfocal Extremities: No edema, distal peripheral pulses palpable.  Skin: No rashes,no icterus. MSK: Normal muscle bulk,tone, power Right chest with HD catheter.  Data Reviewed: I have personally reviewed following labs and imaging studies CBC: Recent Labs  Lab 09/12/19 0349 09/12/19 0349 09/13/19 0434 09/14/19 0434 09/15/19 0344 09/16/19 0409 09/18/19 0941  WBC 6.4  --   --  5.6 5.1 5.5 6.0  NEUTROABS 3.8  --   --  3.4 2.7  --   --   HGB 8.8*   < > 8.9* 8.6* 9.0* 9.8* 8.7*  HCT 28.7*   < > 29.0* 28.2* 28.6* 30.9* 29.0*  MCV 98.6  --   --  98.9 96.6 97.2 100.3*  PLT 153  --   --  154 147* 163 162   < > = values in this interval not displayed.   Basic Metabolic Panel: Recent Labs  Lab 09/12/19 0349 09/12/19 0349 09/13/19 0434 09/13/19 0434 09/14/19 0434 09/15/19 0344 09/15/19 2124 09/16/19 0409 09/18/19 0942  NA 138   < > 139  --  138 135  --  133* 136  K 3.7   < > 4.5  --  4.3 4.3  --  5.1 4.7   CL 100   < > 101  --  101 99  --  98 100  CO2 27   < > 27  --  26 24  --  21* 25  GLUCOSE 123*   < > 129*   < > 161* 140* 434* 399* 90  BUN 10   < > 19  --  29* 12  --  28* 30*  CREATININE 2.58*   < > 3.46*  --  4.68* 2.78*  --  3.93* 3.76*  CALCIUM 8.2*   < > 8.3*  --  8.5* 8.3*  --  8.6* 8.5*  PHOS 2.9  --  3.8  --  4.4  --   --   --  2.8   < > = values in this interval not displayed.   GFR: Estimated Creatinine Clearance: 17 mL/min (A) (by C-G formula based on SCr of 3.76 mg/dL (H)). Liver Function Tests: Recent Labs  Lab 09/12/19 0349 09/13/19 0434 09/14/19 0434 09/18/19 0942  ALBUMIN 2.5* 2.6* 2.6* 2.6*   No results for input(s): LIPASE, AMYLASE in the last 168 hours. No results for input(s): AMMONIA in the last 168 hours. Coagulation Profile: No results for input(s): INR, PROTIME in the last 168 hours. Cardiac Enzymes: No results for input(s): CKTOTAL, CKMB, CKMBINDEX, TROPONINI in the last 168 hours. BNP (last 3 results) No results for input(s): PROBNP in the last 8760 hours. HbA1C: No results for input(s): HGBA1C in the last 72 hours. CBG: Recent Labs  Lab 09/17/19 1114 09/17/19 1622 09/17/19 2027 09/18/19 0615 09/18/19 1210  GLUCAP 143* 202* 186* 113* 135*   Lipid Profile: No results for input(s): CHOL, HDL, LDLCALC, TRIG, CHOLHDL, LDLDIRECT in the last 72 hours. Thyroid Function Tests: No results for input(s): TSH, T4TOTAL, FREET4, T3FREE, THYROIDAB in the last 72 hours. Anemia Panel: No results for input(s): VITAMINB12, FOLATE, FERRITIN, TIBC, IRON, RETICCTPCT in the last 72 hours. Sepsis Labs: No results for input(s): PROCALCITON, LATICACIDVEN in the last 168 hours.  Recent Results (from the past 240 hour(s))  Blood culture (routine x 2)     Status: None   Collection Time: 09/10/19 12:22 AM   Specimen: BLOOD  Result Value Ref Range Status   Specimen Description BLOOD BLOOD RIGHT WRIST  Final   Special Requests   Final    BOTTLES DRAWN AEROBIC AND  ANAEROBIC Blood Culture adequate volume   Culture   Final    NO GROWTH 5 DAYS Performed at [facility], [address], [place], [place] [address]    Report Status 09/15/2019 FINAL  Final  Blood culture (routine x 2)     Status: None   Collection Time: 09/10/19 12:28 AM   Specimen: BLOOD  Result Value Ref Range Status   Specimen Description BLOOD RIGHT ANTECUBITAL  Final   Special Requests   Final    BOTTLES DRAWN AEROBIC AND ANAEROBIC Blood Culture adequate volume   Culture   Final    NO GROWTH 5 DAYS Performed at [facility], [address], [place], [place] [address]    Report Status 09/15/2019 FINAL  Final  Urine Culture     Status: Abnormal   Collection Time: 09/10/19  1:29 AM   Specimen: Urine, Random  Result Value Ref Range Status   Specimen Description   Final    URINE, RANDOM Performed at [facility], [address], [place],  [address]    Special Requests   Final    NONE Performed at [facility], [address] [place],  [address]    Culture MULTIPLE SPECIES PRESENT, SUGGEST RECOLLECTION (A)  Final   Report Status 09/11/2019 FINAL  Final  Respiratory Panel by RT PCR (Flu A&B, Covid) - Nasopharyngeal Swab     Status: None   Collection Time: 09/10/19  1:29 AM   Specimen: Nasopharyngeal Swab  Result Value Ref Range Status   SARS Coronavirus 2 by RT PCR NEGATIVE NEGATIVE Final    Comment: (NOTE) SARS-CoV-2 target nucleic acids are NOT DETECTED. The SARS-CoV-2 RNA is generally detectable in upper respiratoy specimens during the acute phase of infection. The lowest concentration of SARS-CoV-2 viral copies this assay can  detect is 131 copies/mL. A negative result does not preclude SARS-Cov-2 infection and should not be used as the sole basis for treatment or other patient management decisions. A negative result may occur with  improper specimen collection/handling, submission of specimen other than nasopharyngeal swab, presence of viral mutation(s)  within the areas targeted by this assay, and inadequate number of viral copies (<131 copies/mL). A negative result must be combined with clinical observations, patient history, and epidemiological information. The expected result is Negative. Fact Sheet for Patients:  PinkCheek.be Fact Sheet for Healthcare Providers:  GravelBags.it This test is not yet ap proved or cleared by the [place] FDA and  has been authorized for detection and/or diagnosis of SARS-CoV-2 by FDA under an Emergency Use Authorization (EUA). This EUA will remain  in effect (meaning this test can be used) for the duration of the COVID-19 declaration under Section 564(b)(1) of the Act, 21 U.S.C. section 360bbb-3(b)(1), unless the authorization is terminated or revoked sooner.    Influenza A by PCR NEGATIVE NEGATIVE Final   Influenza B by PCR NEGATIVE NEGATIVE Final    Comment: (NOTE) The Xpert Xpress SARS-CoV-2/FLU/RSV assay is intended as an aid in  the diagnosis of influenza from Nasopharyngeal swab specimens and  should not be used as a sole basis for treatment. Nasal washings and  aspirates are unacceptable for Xpert Xpress SARS-CoV-2/FLU/RSV  testing. Fact Sheet for Patients: PinkCheek.be Fact Sheet for Healthcare Providers: GravelBags.it This test is not yet approved or cleared by the [place] FDA and  has been authorized for detection and/or diagnosis of SARS-CoV-2 by  FDA under an Emergency Use Authorization (EUA). This EUA will remain  in effect (meaning this test can be used) for the duration of the  Covid-19 declaration under Section 564(b)(1) of the Act, 21  U.S.C. section 360bbb-3(b)(1), unless the authorization is  terminated or revoked. Performed at [facility], [address] [place], [place] [address]   GI pathogen panel by PCR, stool     Status: None   Collection Time:  09/15/19  1:56 AM   Specimen: STOOL  Result Value Ref Range Status   Plesiomonas shigelloides NOT DETECTED NOT DETECTED Final   Yersinia enterocolitica NOT DETECTED NOT DETECTED Final   Vibrio NOT DETECTED NOT DETECTED Final   Enteropathogenic E coli NOT DETECTED NOT DETECTED Final   E coli (ETEC) LT/ST NOT DETECTED NOT DETECTED Final   E coli 0157 by PCR NOT DETECTED NOT DETECTED Final   Cryptosporidium by PCR NOT DETECTED NOT DETECTED Final   Entamoeba histolytica NOT DETECTED NOT DETECTED Final   Adenovirus F 40/41 NOT DETECTED NOT DETECTED Final   Norovirus GI/GII NOT DETECTED NOT DETECTED Final   Sapovirus NOT DETECTED NOT DETECTED Final    Comment: (NOTE) Performed At: [facility] [place], [place] [id number] Rush Farmer MD RC:[id number]    Vibrio cholerae NOT DETECTED NOT DETECTED Final   Campylobacter by PCR NOT DETECTED NOT DETECTED Final   Salmonella by PCR NOT DETECTED NOT DETECTED Final   E coli (STEC) NOT DETECTED NOT DETECTED Final   Enteroaggregative E coli NOT DETECTED NOT DETECTED Final   Shigella by PCR NOT DETECTED NOT DETECTED Final   Cyclospora cayetanensis NOT DETECTED NOT DETECTED Final   Astrovirus NOT DETECTED NOT DETECTED Final   G lamblia by PCR NOT DETECTED NOT DETECTED Final   Rotavirus A by PCR NOT DETECTED NOT DETECTED Final  C difficile quick scan w PCR reflex  Status: None   Collection Time: 09/15/19  1:56 AM   Specimen: STOOL  Result Value Ref Range Status   C Diff antigen NEGATIVE NEGATIVE Final   C Diff toxin NEGATIVE NEGATIVE Final   C Diff interpretation No C. difficile detected.  Final    Comment: Performed at [facility], [place] [address] [place], [place] [address]  SARS CORONAVIRUS 2 (TAT 6-24 HRS) Nasopharyngeal Nasopharyngeal Swab     Status: None   Collection Time: 09/15/19  9:27 PM   Specimen: Nasopharyngeal Swab  Result Value Ref Range Status   SARS Coronavirus 2 NEGATIVE NEGATIVE Final     Comment: (NOTE) SARS-CoV-2 target nucleic acids are NOT DETECTED. The SARS-CoV-2 RNA is generally detectable in upper and lower respiratory specimens during the acute phase of infection. Negative results do not preclude SARS-CoV-2 infection, do not rule out co-infections with other pathogens, and should not be used as the sole basis for treatment or other patient management decisions. Negative results must be combined with clinical observations, patient history, and epidemiological information. The expected result is Negative. Fact Sheet for Patients: SugarRoll.be Fact Sheet for Healthcare Providers: https://www.woods-mathews.com/ This test is not yet approved or cleared by the [place] FDA and  has been authorized for detection and/or diagnosis of SARS-CoV-2 by FDA under an Emergency Use Authorization (EUA). This EUA will remain  in effect (meaning this test can be used) for the duration of the COVID-19 declaration under Section 56 4(b)(1) of the Act, 21 U.S.C. section 360bbb-3(b)(1), unless the authorization is terminated or revoked sooner. Performed at [facility], [place] [address] [place], [place] [address]       Radiology Studies: No results found.  LOS: 8 days   Time spent: More than 50% of that time was spent in counseling and/or coordination of care.  Antonieta Pert, MD [facility]  09/18/2019, 1:27 PM

## 2019-09-18 NOTE — Progress Notes (Signed)
[facility] Progress Note  Subjective: no c/o, on HD  Vitals:   09/18/19 1100 09/18/19 1130 09/18/19 1135 09/18/19 1212  BP: (!) 156/79 (!) 175/72 (!) 165/79 (!) 179/87  Pulse: 86 86 85 85  Resp: 19 18 17 18   Temp:   98.7 F (37.1 C) 98.1 F (36.7 C)  TempSrc:   Oral Oral  SpO2:   98% 93%  Weight:   69.9 kg   Height:        Exam: General: well developed female, alert and in NAD Heart: RRR, no murmurs, rubs or gallops Lungs: CTA bilaterally without wheezing, rhonchi or rales Abdomen: Soft, non-distended. +BS Extremities: No edema b/l lower extremities Dialysis Access:  R IJ TDC without erythema/drainage, maturing LUE AVF   Dialysis OP: [facility] MWF, started 08/26/19  4h  400/600  2/2.5 bath  Hep none  RIJ TDC/ maturing AVF L  - EPO 10K each hd  - venofer 50 mg/ wk  Assessment/Plan: 1. Bilateral lower extremity numbness/incontince: Neuro following, MRI thoracic/ lumbar spine neg for lesions/ stenosis. C-spine MRI done 3/4 also negative. Neuro suspects polyradiculopathy from longstanding uncontrolled DM, recommending PT and outpatient EMG. Per primary/ neuro 2. ESRD:  [place] patient on MWF patient. Tolerating HD well today, UFG 3L. K+ controlled. Continue MWF schedule.  3. HTN/volume:  BP remains elevated. Continue amlodipine and carvedilol, volume management with HD. Large UF today on HD.  4. Anemia: Hgb 9.8- trending up. Last Tsat low. Continue aranesp and IV Fe.  5. Secondary hyperparathyroidism:  Corrected calcium 9.7, phos at goal. Not currently on a binder.   6. Nutrition:  K+ and phos controlled. Recommend fluid restriction.      Rob Araya Roel  MD 09/18/2019, 12:44 PM       Rob Jarl Sellitto 09/18/2019, 12:46 PM   Recent Labs  Lab 09/14/19 0434 09/15/19 0344 09/16/19 0409 09/18/19 0941 09/18/19 0942  K 4.3   < > 5.1  --  4.7  BUN 29*   < > 28*  --  30*  CREATININE 4.68*   < > 3.93*  --  3.76*  CALCIUM 8.5*   < > 8.6*  --  8.5*  PHOS  4.4  --   --   --  2.8  HGB 8.6*   < > 9.8* 8.7*  --    < > = values in this interval not displayed.   Inpatient medications: . amLODipine  5 mg Oral Daily  . carvedilol  6.25 mg Oral BID WC  . Chlorhexidine Gluconate Cloth  6 each Topical Q0600  . clotrimazole  1 Applicatorful Vaginal QHS  . darbepoetin (ARANESP) injection - DIALYSIS  40 mcg Intravenous Q Mon-HD  . gabapentin  300 mg Oral QHS  . heparin  5,000 Units Subcutaneous Q8H  . insulin aspart  0-5 Units Subcutaneous QHS  . insulin aspart  0-6 Units Subcutaneous TID WC  . insulin glargine  8 Units Subcutaneous BID  . levothyroxine  25 mcg Oral QAC breakfast  . multivitamin  1 tablet Oral QHS  . pantoprazole  40 mg Oral Daily  . sertraline  25 mg Oral Daily  . tamsulosin  0.4 mg Oral QPC supper   . ferric gluconate (FERRLECIT/NULECIT) IV Stopped (09/18/19 1236)   acetaminophen **OR** acetaminophen, calcium carbonate (dosed in mg elemental calcium), camphor-menthol **AND** hydrOXYzine, docusate sodium, feeding supplement (NEPRO CARB STEADY), ondansetron **OR** ondansetron (ZOFRAN) IV, sorbitol, zolpidem

## 2019-09-19 LAB — GLUCOSE, CAPILLARY
Glucose-Capillary: 118 mg/dL — ABNORMAL HIGH (ref 70–99)
Glucose-Capillary: 121 mg/dL — ABNORMAL HIGH (ref 70–99)

## 2019-09-19 MED ORDER — CARVEDILOL 6.25 MG PO TABS: 6.2500 mg | ORAL_TABLET | Freq: Two times a day (BID) | ORAL | 1 refills | Status: DC

## 2019-09-19 MED ORDER — GABAPENTIN 300 MG PO CAPS: 300.0000 mg | ORAL_CAPSULE | Freq: Every day | ORAL | 0 refills | Status: DC

## 2019-09-19 MED ORDER — SERTRALINE HCL 25 MG PO TABS: 25.0000 mg | ORAL_TABLET | Freq: Every day | ORAL | 0 refills | Status: DC

## 2019-09-19 MED ORDER — TAMSULOSIN HCL 0.4 MG PO CAPS: 0.4000 mg | ORAL_CAPSULE | Freq: Every day | ORAL | 1 refills | Status: DC

## 2019-09-19 NOTE — Progress Notes (Signed)
Discharge gone over with daughter.Educated on foley care. IV taken out. Taken by wheelchair to patient pick up.

## 2019-09-19 NOTE — Plan of Care (Signed)
  Problem: Health Behavior/Discharge Planning: Goal: Ability to manage health-related needs will improve Outcome: Adequate for Discharge   Problem: Activity: Goal: Risk for activity intolerance will decrease Outcome: Adequate for Discharge   Problem: Nutrition: Goal: Adequate nutrition will be maintained Outcome: Adequate for Discharge   Problem: Coping: Goal: Level of anxiety will decrease Outcome: Adequate for Discharge   Problem: Elimination: Goal: Will not experience complications related to bowel motility Outcome: Adequate for Discharge Goal: Will not experience complications related to urinary retention Outcome: Adequate for Discharge   Problem: Skin Integrity: Goal: Risk for impaired skin integrity will decrease Outcome: Adequate for Discharge

## 2019-09-19 NOTE — Care Management (Signed)
[facility] forms left on chart. Patient calling daughter to verify she is home. Discussed w RN that she may have problems getting up stairs to get into house. [facility] is better option than home by car. RN to follow up w patient to make sure she reaches daughter and will call [facility]. [facility] number and forms left on chart.

## 2019-09-19 NOTE — Discharge Summary (Signed)
Physician Discharge Summary  CASSONDRA STACHOWSKI ERX:[id number] DOB: [birth year] DOA: 09/09/2019  PCP: Sandria Manly [place] date: 03/14/931 Discharge date: 09/19/2019  Admitted From: Home Disposition: Home with family  Recommendations for Outpatient Follow-up:  1. Follow up with PCP in 1-2 weeks 2. Follow-up with urology in 2 weeks Dr. Claudia Desanctis at any pain/[place], follow-up with neurology. 3. Please obtain BMP/CBC in one week 4. Please follow up on the following pending results:  Home Health:yes- if able to arrange/if charity avaialble  Equipment/Devices: foley  Discharge Condition: Stable Code Status: full Diet recommendation: Heart Healthy, diabetic   Brief/Interim Summary:  49 year old female with history of hypertension, diabetes, ESRD recently started on dialysis presented with weakness.  Patient is started HD 4 weeks PTA and around the same time inability to walk with generalized weakness.  Numbness and tingling in her arms and legs and also urinary and fecal incontinence.  As per daughter patient has been in and out of hospital for 2 to 3 weeks, during the time she was hospitalized she went and got a wants to walk and has been unable to walk since.  [facility] has been helping at home and there is concerns about her inability to walk.  Patient is not taking medication, not on insulin and daughter is taking her to dialysis Monday Wednesday Friday and otherwise only eats fast food does not want to eat  right.  Strong history of  mental health disorder in the family and daughter agreed for psychiatry evaluation. In the ER work-up showed UTI, MRI was recommended for further evaluation along with neurology and patient was admitted.Patient was seen by neurology, nephrology.  Patient underwent further work-up TSH 4.5, sed rate 45, A1c 10.2 B12 818.  Patient had further imaging . BACK IN feb she had  CT head, CT lumbar spine, CT cervical spine.During this admission she underwent MRI of  cervical spine lumbar spine thoracic spine as resulted below.See needed Anastassia for  thoracic and lumbar MRI-MRI scan did not show any structural abnormalities.Neurology feels likely from polyradiculopathy 2/2 standing diabetes mellitus and have advised no further inpatient work-up and follow-up with neurology as outpatient for EMG and continue PT OT.  Patient having some incontinence but however she has been retaining urine unable to void and has been needing in and out catheterization at least once a day.Discussed about doing self cath, appears to be reluctant.  I discussed with Dr. Claudia Desanctis from urology recommends Foley catheter for 2 weeks and outpatient follow-up and she will set up/arrange appointment at [facility] as patient is from [place]. No further inpatient hospitalization necessary and patient is medically stable for discharge  Discharge Diagnoses:  Inability to walk/numbness tingling/fecal and urinary incontinence/debility: Patient is able to move her lower extremities and upper extremities.Had extensive work-up per neurology including ESR B12, CRP 8 5, RBC folate at 1312, TSH 3.2.  MRI cervical lumbar and thoracic spine-did not show any structural abnormalities.Suspecting polyradiculopathy likely from longstanding uncontrolled diabetes resulting in patient's numbness. Continue PT OT and will need outpatient neurology evaluation and  nerve conduction EMG.Neurology signed off.Continue Neurontin  per neurology.  Patient needing in and out catheterization we will keep her in Foley cath for 2 weeks and follow-up with urology as o/p. Discussed w Dr Claudia Desanctis who will set up appointment.  ESRD on HD MWF: Recently started on HD 4 weeks ago. Last HD 3/12 Appreciate nephrology follow-up.  Daughter has been taking her to dialysis unit 3 times a week.  Type 2  diabetes with nephropathy:HBA1c 7.2.blood sugar fluctuating uncontrolled ranging 87-240s. Previously in 400s.Lantus was resumed at lower dose-bid with  improvement in sugar ( cbg was in 400s initially) -patient can resume her home insulin upon discharge.    Anemia of renal disease.  Hemoglobin is stable.Monitor  HLD/HTN: Acceptable somewhat on higher side. Continue amlodipine and Coreg.  Large volume ultra filtration dialysis per nephrology today.  Continue with fluid adjustment dialysis.  Vaginal candidiasis status post Diflucan.  Continue clotrimazole cream nightly.  Depression: Mood is overall stable. Concerned that patient's symptoms may have a psychogenic component psychiatric consultation obtained and fail patient did not meet inpatient criteria and recommended social worker follow-up with daughter due to voiced concern.  Started on Zoloft per psych recommendation.  Hypothyroidism euthyroid, continue Synthroid  Acute cystitis without hematuria: received a dose of ceftriaxone in the ER.  Urine culture multiple species. And asymptomatic no fever no leukocytosis.   Loose stool with incontinence.  C. difficile negative.  Symptoms improved.  Gen Weakness/debility: Continue PT OT.  Skilled nursing advised currently unable to be placed due to no insurance.Body mass index is 27.3 kg/m.  DVT prophylaxis:Heparin Code Status:FUL  Family Communication: plan of care discussed with patient at bedside. Disposition Plan: Patient is from:HOME Anticipated Disposition: to  SNF vs HHC Barriers to discharge or conditions that needs to be met prior to discharge: Medically stable.  Will discharge with her daughter.  Case manager completed the paperwork to help with her financial situation and also to arrange for charity home health services-RN PT bit per CM not available this week.  Consultants: NEPHROLOGY/NEUROLOGY/UROLOGY Dr Claudia Desanctis. Procedures: MRI of cervical spine--shows no acute or focal lesion to explain the patient's symptoms. Straightening of the normal cervical lordosis is nonspecific, but can seen in the setting of muscle strain or ongoing  pain. MR Lumbar and THORACIC SPINE WO CONTRAST Result Date: 3/9/2021IMPRESSION: MR THORACIC SPINE IMPRESSION Negative for thoracic fracture or mass. No significant stenosis in the thoracic spine. MR LUMBAR SPINE IMPRESSION Moderately severe compression fracture L1 is stable from the CT of 08/23/2019. No retropulsion of bone into the canal and no spinal stenosis at this level. No significant spinal stenosis in the remainder of the lumbar spine Improving left perinephric hematoma following renal biopsy. Electronically Signed By: Franchot Gallo M.D. On: 09/15/2019 14:05  Microbiology:see note  Subjective: No new complaints.  Reports her daughter is coming to pick her up this afternoon. Discharge Exam: Vitals:   09/19/19 0603 09/19/19 0847  BP: (!) 159/76 (!) 145/67  Pulse: 75 76  Resp: 16 18  Temp: 98.6 F (37 C) 98.6 F (37 C)  SpO2: 96% 100%   General: Pt is alert, awake, not in acute distress Cardiovascular: RRR, S1/S2 +, no rubs, no gallops Respiratory: CTA bilaterally, no wheezing, no rhonchi Abdominal: Soft, NT, ND, bowel sounds + Extremities: no edema, no cyanosis  Discharge Instructions  Discharge Instructions    Diet - low sodium heart healthy   Complete by: As directed    Discharge instructions   Complete by: As directed    Please call call MD or return to ER for similar or worsening recurring problem that brought you to hospital or if any fever,nausea/vomiting,abdominal pain, uncontrolled pain, chest pain,  shortness of breath or any other alarming symptoms.  You need to follow-up with neurology as outpatient and need EMG nerve conduction  study  Follow-up with urology in 2 weeks regarding Foley catheter management.  Continue your dialysis as scheduled.  Please follow-up  your doctor as instructed in a week time and call the office for appointment.  Please avoid alcohol, smoking, or any other illicit substance and maintain healthy habits including taking your  regular medications as prescribed.  You were cared for by a hospitalist during your hospital stay. If you have any questions about your discharge medications or the care you received while you were in the hospital after you are discharged, you can call the unit and ask to speak with the hospitalist on call if the hospitalist that took care of you is not available.  Once you are discharged, your primary care physician will handle any further medical issues. Please note that NO REFILLS for any discharge medications will be authorized once you are discharged, as it is imperative that you return to your primary care physician (or establish a relationship with a primary care physician if you do not have one) for your aftercare needs so that they can reassess your need for medications and monitor your lab values   Face-to-face encounter (required for Medicare/Medicaid patients)   Complete by: As directed    I Antonieta Pert certify that this patient is under my care and that I, or a nurse practitioner or physician's assistant working with me, had a face-to-face encounter that meets the physician face-to-face encounter requirements with this patient on 09/19/2019. The encounter with the patient was in whole, or in part for the following medical condition(s) which is the primary reason for home health care (List medical condition): Polyneuropathy incontinence, ESRD on dialysis,   The encounter with the patient was in whole, or in part, for the following medical condition, which is the primary reason for home health care: Deconditioning, neuropathy   I certify that, based on my findings, the following services are medically necessary home health services:  Nursing Physical therapy     Reason for Medically Necessary Home Health Services: Skilled Nursing- Teaching of Disease Process/Symptom Management   My clinical findings support the need for the above services: Unable to leave home safely without assistance and/or  assistive device   Further, I certify that my clinical findings support that this patient is homebound due to: Unable to leave home safely without assistance   Home Health   Complete by: As directed    To provide the following care/treatments:  PT RN     Increase activity slowly   Complete by: As directed      Allergies as of 09/19/2019   No Known Allergies     Medication List    TAKE these medications   amLODipine 5 MG tablet Commonly known as: NORVASC Take 5 mg by mouth daily.   carvedilol 6.25 MG tablet Commonly known as: COREG Take 1 tablet (6.25 mg total) by mouth 2 (two) times daily with a meal. What changed:   medication strength  how much to take   epoetin alfa 3000 UNIT/ML injection Commonly known as: EPOGEN Inject 3,000 Units into the skin every 14 (fourteen) days.   gabapentin 300 MG capsule Commonly known as: NEURONTIN Take 1 capsule (300 mg total) by mouth at bedtime. What changed:   medication strength  how much to take  when to take this   levothyroxine 25 MCG tablet Commonly known as: SYNTHROID Take 1 tablet (25 mcg total) by mouth daily.   lovastatin 40 MG tablet Commonly known as: MEVACOR Take 1 tablet (40 mg total) by mouth at bedtime.   multivitamin Tabs tablet Take 1 tablet by mouth at bedtime.  pantoprazole 40 MG tablet Commonly known as: Protonix Take 1 tablet (40 mg total) by mouth daily.   sertraline 25 MG tablet Commonly known as: ZOLOFT Take 1 tablet (25 mg total) by mouth daily.   tamsulosin 0.4 MG Caps capsule Commonly known as: FLOMAX Take 1 capsule (0.4 mg total) by mouth daily after supper.      Follow-up Information    [facility] Follow up in 1 week(s).   Contact information: [address] [place] [address] [phone]        [place]. Call in 3 day(s).   Specialty: Urology Why: call for appointment if you do not hear from them. Contact  information: [phone]       [facility]. Call.   Specialty: Neurology Why: for EMG/Neuro follow up Contact information: [place] [phone]         No Known Allergies  The results of significant diagnostics from this hospitalization (including imaging, microbiology, ancillary and laboratory) are listed below for reference.    Microbiology: Recent Results (from the past 240 hour(s))  Blood culture (routine x 2)     Status: None   Collection Time: 09/10/19 12:22 AM   Specimen: BLOOD  Result Value Ref Range Status   Specimen Description BLOOD BLOOD RIGHT WRIST  Final   Special Requests   Final    BOTTLES DRAWN AEROBIC AND ANAEROBIC Blood Culture adequate volume   Culture   Final    NO GROWTH 5 DAYS Performed at [facility], [address], [place], [place] [address]    Report Status 09/15/2019 FINAL  Final  Blood culture (routine x 2)     Status: None   Collection Time: 09/10/19 12:28 AM   Specimen: BLOOD  Result Value Ref Range Status   Specimen Description BLOOD RIGHT ANTECUBITAL  Final   Special Requests   Final    BOTTLES DRAWN AEROBIC AND ANAEROBIC Blood Culture adequate volume   Culture   Final    NO GROWTH 5 DAYS Performed at [facility], [address] [place], [place] [address]    Report Status 09/15/2019 FINAL  Final  Urine Culture     Status: Abnormal   Collection Time: 09/10/19  1:29 AM   Specimen: Urine, Random  Result Value Ref Range Status   Specimen Description   Final    URINE, RANDOM Performed at [facility], [address] [place], [place] [address]    Special Requests   Final    NONE Performed at [facility], [address], [place], [place] [address]    Culture MULTIPLE SPECIES PRESENT, SUGGEST RECOLLECTION (A)  Final   Report Status 09/11/2019 FINAL  Final  Respiratory Panel by RT PCR (Flu A&B, Covid) - Nasopharyngeal Swab     Status: None   Collection Time:  09/10/19  1:29 AM   Specimen: Nasopharyngeal Swab  Result Value Ref Range Status   SARS Coronavirus 2 by RT PCR NEGATIVE NEGATIVE Final    Comment: (NOTE) SARS-CoV-2 target nucleic acids are NOT DETECTED. The SARS-CoV-2 RNA is generally detectable in upper respiratoy specimens during the acute phase of infection. The lowest concentration of SARS-CoV-2 viral copies this assay can detect is 131 copies/mL. A negative result does not preclude SARS-Cov-2 infection and should not be used as the sole basis for treatment or other patient management decisions. A negative result may occur with  improper specimen collection/handling, submission of specimen other than  nasopharyngeal swab, presence of viral mutation(s) within the areas targeted by this assay, and inadequate number of viral copies (<131 copies/mL). A negative result must be combined with clinical observations, patient history, and epidemiological information. The expected result is Negative. Fact Sheet for Patients:  PinkCheek.be Fact Sheet for Healthcare Providers:  GravelBags.it This test is not yet ap proved or cleared by the [place] FDA and  has been authorized for detection and/or diagnosis of SARS-CoV-2 by FDA under an Emergency Use Authorization (EUA). This EUA will remain  in effect (meaning this test can be used) for the duration of the COVID-19 declaration under Section 564(b)(1) of the Act, 21 U.S.C. section 360bbb-3(b)(1), unless the authorization is terminated or revoked sooner.    Influenza A by PCR NEGATIVE NEGATIVE Final   Influenza B by PCR NEGATIVE NEGATIVE Final    Comment: (NOTE) The Xpert Xpress SARS-CoV-2/FLU/RSV assay is intended as an aid in  the diagnosis of influenza from Nasopharyngeal swab specimens and  should not be used as a sole basis for treatment. Nasal washings and  aspirates are unacceptable for Xpert Xpress SARS-CoV-2/FLU/RSV   testing. Fact Sheet for Patients: PinkCheek.be Fact Sheet for Healthcare Providers: GravelBags.it This test is not yet approved or cleared by the [place] FDA and  has been authorized for detection and/or diagnosis of SARS-CoV-2 by  FDA under an Emergency Use Authorization (EUA). This EUA will remain  in effect (meaning this test can be used) for the duration of the  Covid-19 declaration under Section 564(b)(1) of the Act, 21  U.S.C. section 360bbb-3(b)(1), unless the authorization is  terminated or revoked. Performed at [facility], [address], [place], [place] [address]   GI pathogen panel by PCR, stool     Status: None   Collection Time: 09/15/19  1:56 AM   Specimen: STOOL  Result Value Ref Range Status   Plesiomonas shigelloides NOT DETECTED NOT DETECTED Final   Yersinia enterocolitica NOT DETECTED NOT DETECTED Final   Vibrio NOT DETECTED NOT DETECTED Final   Enteropathogenic E coli NOT DETECTED NOT DETECTED Final   E coli (ETEC) LT/ST NOT DETECTED NOT DETECTED Final   E coli 0157 by PCR NOT DETECTED NOT DETECTED Final   Cryptosporidium by PCR NOT DETECTED NOT DETECTED Final   Entamoeba histolytica NOT DETECTED NOT DETECTED Final   Adenovirus F 40/41 NOT DETECTED NOT DETECTED Final   Norovirus GI/GII NOT DETECTED NOT DETECTED Final   Sapovirus NOT DETECTED NOT DETECTED Final    Comment: (NOTE) Performed At: [facility] [place], [place] [id number] Rush Farmer MD ZD:[id number]    Vibrio cholerae NOT DETECTED NOT DETECTED Final   Campylobacter by PCR NOT DETECTED NOT DETECTED Final   Salmonella by PCR NOT DETECTED NOT DETECTED Final   E coli (STEC) NOT DETECTED NOT DETECTED Final   Enteroaggregative E coli NOT DETECTED NOT DETECTED Final   Shigella by PCR NOT DETECTED NOT DETECTED Final   Cyclospora cayetanensis NOT DETECTED NOT DETECTED Final   Astrovirus NOT DETECTED NOT DETECTED  Final   G lamblia by PCR NOT DETECTED NOT DETECTED Final   Rotavirus A by PCR NOT DETECTED NOT DETECTED Final  C difficile quick scan w PCR reflex     Status: None   Collection Time: 09/15/19  1:56 AM   Specimen: STOOL  Result Value Ref Range Status   C Diff antigen NEGATIVE NEGATIVE Final   C Diff toxin NEGATIVE NEGATIVE Final   C Diff interpretation No  C. difficile detected.  Final    Comment: Performed at [facility], [place] [address], [place], [place] [address]  SARS CORONAVIRUS 2 (TAT 6-24 HRS) Nasopharyngeal Nasopharyngeal Swab     Status: None   Collection Time: 09/15/19  9:27 PM   Specimen: Nasopharyngeal Swab  Result Value Ref Range Status   SARS Coronavirus 2 NEGATIVE NEGATIVE Final    Comment: (NOTE) SARS-CoV-2 target nucleic acids are NOT DETECTED. The SARS-CoV-2 RNA is generally detectable in upper and lower respiratory specimens during the acute phase of infection. Negative results do not preclude SARS-CoV-2 infection, do not rule out co-infections with other pathogens, and should not be used as the sole basis for treatment or other patient management decisions. Negative results must be combined with clinical observations, patient history, and epidemiological information. The expected result is Negative. Fact Sheet for Patients: SugarRoll.be Fact Sheet for Healthcare Providers: https://www.woods-mathews.com/ This test is not yet approved or cleared by the [place] FDA and  has been authorized for detection and/or diagnosis of SARS-CoV-2 by FDA under an Emergency Use Authorization (EUA). This EUA will remain  in effect (meaning this test can be used) for the duration of the COVID-19 declaration under Section 56 4(b)(1) of the Act, 21 U.S.C. section 360bbb-3(b)(1), unless the authorization is terminated or revoked sooner. Performed at [facility], [place] [address] [place], [place] [address]      Procedures/Studies: CT Head Wo Contrast  Result Date: 09/10/2019 CLINICAL DATA:  Weakness, locking EXAM: CT HEAD WITHOUT CONTRAST TECHNIQUE: Contiguous axial images were obtained from the base of the skull through the vertex without intravenous contrast. COMPARISON:  August 08, 2019 FINDINGS: Brain: No evidence of acute territorial infarction, hemorrhage, hydrocephalus,extra-axial collection or mass lesion/mass effect. Normal gray-white differentiation. Ventricles are normal in size and contour. Vascular: No hyperdense vessel or unexpected calcification. Skull: The skull is intact. No fracture or focal lesion identified. Sinuses/Orbits: The visualized paranasal sinuses and mastoid air cells are clear. The orbits and globes intact. Other: None IMPRESSION: No acute intracranial abnormality. Electronically Signed   By: Prudencio Pair M.D.   On: 09/10/2019 00:18   CT LUMBAR SPINE WO CONTRAST  Result Date: 08/23/2019 CLINICAL DATA:  Fecal incontinence. Bilateral lower extremity weakness. EXAM: CT LUMBAR SPINE WITHOUT CONTRAST TECHNIQUE: Multidetector CT imaging of the lumbar spine was performed without intravenous contrast administration. Multiplanar CT image reconstructions were also generated. COMPARISON:  08/09/2019 lumbar spine CT. 08/13/2019 CT abdomen and pelvis. FINDINGS: Segmentation: 5 lumbar type vertebrae. Alignment: Normal. Vertebrae: Unchanged L1 compression fracture with 60% vertebral body height loss, heterogeneous lucency and gas anteriorly and centrally in the vertebral body, and surrounding sclerosis most notable posteriorly. No retropulsion or evidence of extraosseous tumor. No new fracture. Background osteopenia. Paraspinal and other soft tissues: Partially visualized small left perinephric hematoma without enlargement from the previous abdominal CT. Nonobstructing punctate renal calculi. Partially visualized small volume ascites. Age advanced atherosclerosis. Partially visualized gallstones.  Disc levels: Unchanged appearance of mild lumbar spondylosis and mild lower lumbar facet hypertrophy compared to the prior lumbar CT without evidence of compressive stenosis. IMPRESSION: 1. Unchanged L1 compression fracture. 2. No acute osseous abnormality in the lumbar spine. 3. Persistent small left perinephric hematoma. Electronically Signed   By: Logan Bores M.D.   On: 08/23/2019 13:07   MR CERVICAL SPINE WO CONTRAST  Result Date: 09/10/2019 CLINICAL DATA:  Neuro deficit, acute, stroke suspected. Bilateral lower extremity weakness. Bladder stool contents. Symptoms 4 weeks. End-stage renal disease. Hospitalization approximately  1 month ago. EXAM: MRI CERVICAL SPINE WITHOUT CONTRAST TECHNIQUE: Multiplanar, multisequence MR imaging of the cervical spine was performed. No intravenous contrast was administered. COMPARISON:  CT of the cervical spine 08/13/2019 FINDINGS: Alignment: Significant listhesis is present. There is some straightening of the normal cervical lordosis. Vertebrae: Marrow signal and vertebral body heights are normal. Cord: The study is mildly degraded by patient motion. No abnormal cord signal or morphology is present. Signal changes on the T2 sagittal sequence are not present on the axial or T2 STIR sequence. Posterior Fossa, vertebral arteries, paraspinal tissues: Craniocervical junction is normal. Flow is present in the vertebral arteries bilaterally. Visualized intracranial contents are normal. Disc levels: No significant cervical disc protrusion or stenosis is present. The central canal and foramina are patent. IMPRESSION: 1. No acute or focal lesion to explain the patient's symptoms. 2. Straightening of the normal cervical lordosis is nonspecific, but can be seen in the setting of muscle strain or ongoing pain. Electronically Signed   By: San Morelle M.D.   On: 09/10/2019 06:50   MR THORACIC SPINE WO CONTRAST  Result Date: 09/15/2019 CLINICAL DATA:  Low back pain. Rule out  cauda equina syndrome. Known L1 fracture. EXAM: MRI THORACIC AND LUMBAR SPINE WITHOUT CONTRAST TECHNIQUE: Multiplanar and multiecho pulse sequences of the thoracic and lumbar spine were obtained without intravenous contrast. COMPARISON:  CT lumbar spine 08/23/2019 FINDINGS: MRI THORACIC SPINE FINDINGS Alignment:  Normal. Vertebrae: Normal bone marrow. No fracture or mass in the thoracic spine. L1 fracture described below. Cord:  Normal cord signal.  No cord lesion or cord compression Paraspinal and other soft tissues: Large right pleural effusion and moderate left pleural effusion with compressive atelectasis in both lungs right greater than left. Disc levels: No significant stenosis in the thoracic spine. Small right-sided disc protrusion T6-7. Mild facet degeneration lower thoracic spine. No significant foraminal stenosis. MRI LUMBAR SPINE FINDINGS Segmentation:  Normal Alignment:  Normal Vertebrae: Moderately severe compression fracture of L1 vertebral body with diffuse bone marrow edema. Fracture is similar to the prior CT. No retropulsion of bone into the canal and no spinal stenosis. Conus medullaris and cauda equina: Conus extends to the L2 level. Conus and cauda equina appear normal. Paraspinal and other soft tissues: Mild paraspinous edema around the L1 compression fracture. No retroperitoneal mass. Small left perinephric hematoma is improved from the CT of 08/13/2019. This is related to an earlier renal biopsy. 8 x 10 mm hemorrhagic cyst or hematoma in the left lower pole likely the biopsy site. Disc levels: T12-L1: Negative L1-2: Negative L2-3: Negative L3-4: Negative L4-5: Negative L5-S1: Normal disc space. Mild facet degeneration. Negative for stenosis. IMPRESSION: MR THORACIC SPINE IMPRESSION Negative for thoracic fracture or mass. No significant stenosis in the thoracic spine. MR LUMBAR SPINE IMPRESSION Moderately severe compression fracture L1 is stable from the CT of 08/23/2019. No retropulsion of  bone into the canal and no spinal stenosis at this level. No significant spinal stenosis in the remainder of the lumbar spine Improving left perinephric hematoma following  renal biopsy. Electronically Signed   By: Franchot Gallo M.D.   On: 09/15/2019 14:05   MR LUMBAR SPINE WO CONTRAST  Result Date: 09/15/2019 CLINICAL DATA:  Low back pain. Rule out cauda equina syndrome. Known L1 fracture. EXAM: MRI THORACIC AND LUMBAR SPINE WITHOUT CONTRAST TECHNIQUE: Multiplanar and multiecho pulse sequences of the thoracic and lumbar spine were obtained without intravenous contrast. COMPARISON:  CT lumbar spine 08/23/2019 FINDINGS: MRI THORACIC SPINE FINDINGS Alignment:  Normal.  Vertebrae: Normal bone marrow. No fracture or mass in the thoracic spine. L1 fracture described below. Cord:  Normal cord signal.  No cord lesion or cord compression Paraspinal and other soft tissues: Large right pleural effusion and moderate left pleural effusion with compressive atelectasis in both lungs right greater than left. Disc levels: No significant stenosis in the thoracic spine. Small right-sided disc protrusion T6-7. Mild facet degeneration lower thoracic spine. No significant foraminal stenosis. MRI LUMBAR SPINE FINDINGS Segmentation:  Normal Alignment:  Normal Vertebrae: Moderately severe compression fracture of L1 vertebral body with diffuse bone marrow edema. Fracture is similar to the prior CT. No retropulsion of bone into the canal and no spinal stenosis. Conus medullaris and cauda equina: Conus extends to the L2 level. Conus and cauda equina appear normal. Paraspinal and other soft tissues: Mild paraspinous edema around the L1 compression fracture. No retroperitoneal mass. Small left perinephric hematoma is improved from the CT of 08/13/2019. This is related to an earlier renal biopsy. 8 x 10 mm hemorrhagic cyst or hematoma in the left lower pole likely the biopsy site. Disc levels: T12-L1: Negative L1-2: Negative L2-3: Negative  L3-4: Negative L4-5: Negative L5-S1: Normal disc space. Mild facet degeneration. Negative for stenosis. IMPRESSION: MR THORACIC SPINE IMPRESSION Negative for thoracic fracture or mass. No significant stenosis in the thoracic spine. MR LUMBAR SPINE IMPRESSION Moderately severe compression fracture L1 is stable from the CT of 08/23/2019. No retropulsion of bone into the canal and no spinal stenosis at this level. No significant spinal stenosis in the remainder of the lumbar spine Improving left perinephric hematoma following  renal biopsy. Electronically Signed   By: Franchot Gallo M.D.   On: 09/15/2019 14:05   DG Chest Portable 1 View  Result Date: 09/10/2019 CLINICAL DATA:  Weakness and shortness of breath EXAM: PORTABLE CHEST 1 VIEW COMPARISON:  08/08/2019 FINDINGS: Cardiac shadow is stable. Dialysis catheter is now seen in satisfactory position. Pleural effusions are noted right greater than left with central vascular congestion. This may be related to a degree of volume overload. Bibasilar atelectasis is seen. Patchy opacities are noted which may be related to atypical pneumonia. Clinical correlation is recommended. IMPRESSION: Vascular congestion and effusions likely related to volume overload. This is new from the prior exam. Patchy opacities are noted in both lungs suspicious for atypical pneumonia. Correlation with COVID-19 testing is recommended. Electronically Signed   By: Inez Catalina M.D.   On: 09/10/2019 00:28   US Abdomen Limited RUQ  Result Date: 08/23/2019 CLINICAL DATA:  Elevated liver enzymes EXAM: ULTRASOUND ABDOMEN LIMITED RIGHT UPPER QUADRANT COMPARISON:  CT abdomen pelvis 08/13/2019 FINDINGS: Gallbladder: The gallbladder is decompressed which limits evaluation of wall thickness. There are a few small gallstones measuring up to 0.4 cm. No sonographic Murphy sign. Common bile duct: Diameter: 0.4 cm, within normal limits Liver: The liver appears enlarged. No focal lesion identified. Within  normal limits in parenchymal echogenicity. Portal vein is patent on color Doppler imaging with normal direction of blood flow towards the liver. Other: Moderate size right pleural effusion. Possible increased echogenicity of the right kidney incidentally noted. IMPRESSION: 1. The liver appears enlarged. Normal parenchymal echogenicity. No focal lesion identified. 2. The gallbladder is decompressed which limits evaluation. Cholelithiasis without other evidence of cholecystitis. 3.  Moderate right pleural effusion. 4.  Possible increased echogenicity of the right kidney. Electronically Signed   By: Audie Pinto M.D.   On: 08/23/2019 15:40    Labs: BNP (last 3 results) Recent Labs  08/08/19 1607  BNP 097.3*   Basic Metabolic Panel: Recent Labs  Lab 09/13/19 0434 09/13/19 0434 09/14/19 0434 09/15/19 0344 09/15/19 2124 09/16/19 0409 09/18/19 0942  NA 139  --  138 135  --  133* 136  K 4.5  --  4.3 4.3  --  5.1 4.7  CL 101  --  101 99  --  98 100  CO2 27  --  26 24  --  21* 25  GLUCOSE 129*   < > 161* 140* 434* 399* 90  BUN 19  --  29* 12  --  28* 30*  CREATININE 3.46*  --  4.68* 2.78*  --  3.93* 3.76*  CALCIUM 8.3*  --  8.5* 8.3*  --  8.6* 8.5*  PHOS 3.8  --  4.4  --   --   --  2.8   < > = values in this interval not displayed.   Liver Function Tests: Recent Labs  Lab 09/13/19 0434 09/14/19 0434 09/18/19 0942  ALBUMIN 2.6* 2.6* 2.6*   No results for input(s): LIPASE, AMYLASE in the last 168 hours. No results for input(s): AMMONIA in the last 168 hours. CBC: Recent Labs  Lab 09/13/19 0434 09/14/19 0434 09/15/19 0344 09/16/19 0409 09/18/19 0941  WBC  --  5.6 5.1 5.5 6.0  NEUTROABS  --  3.4 2.7  --   --   HGB 8.9* 8.6* 9.0* 9.8* 8.7*  HCT 29.0* 28.2* 28.6* 30.9* 29.0*  MCV  --  98.9 96.6 97.2 100.3*  PLT  --  154 147* 163 162   Cardiac Enzymes: No results for input(s): CKTOTAL, CKMB, CKMBINDEX, TROPONINI in the last 168 hours. BNP: Invalid input(s):  POCBNP CBG: Recent Labs  Lab 09/18/19 0615 09/18/19 1210 09/18/19 1701 09/19/19 0658 09/19/19 1117  GLUCAP 113* 135* 232* 118* 121*   D-Dimer No results for input(s): DDIMER in the last 72 hours. Hgb A1c No results for input(s): HGBA1C in the last 72 hours. Lipid Profile No results for input(s): CHOL, HDL, LDLCALC, TRIG, CHOLHDL, LDLDIRECT in the last 72 hours. Thyroid function studies No results for input(s): TSH, T4TOTAL, T3FREE, THYROIDAB in the last 72 hours.  Invalid input(s): FREET3 Anemia work up No results for input(s): VITAMINB12, FOLATE, FERRITIN, TIBC, IRON, RETICCTPCT in the last 72 hours. Urinalysis    Component Value Date/Time   COLORURINE YELLOW 09/10/2019 0128   APPEARANCEUR TURBID (A) 09/10/2019 0128   LABSPEC >1.030 (H) 09/10/2019 0128   PHURINE 6.0 09/10/2019 0128   GLUCOSEU 250 (A) 09/10/2019 0128   HGBUR LARGE (A) 09/10/2019 0128   HGBUR negative 09/14/2009 1428   BILIRUBINUR NEGATIVE 09/10/2019 0128   KETONESUR NEGATIVE 09/10/2019 0128   PROTEINUR >300 (A) 09/10/2019 0128   UROBILINOGEN 1.0 10/27/2013 2022   NITRITE NEGATIVE 09/10/2019 0128   LEUKOCYTESUR LARGE (A) 09/10/2019 0128   Sepsis Labs Invalid input(s): PROCALCITONIN,  WBC,  LACTICIDVEN Microbiology Recent Results (from the past 240 hour(s))  Blood culture (routine x 2)     Status: None   Collection Time: 09/10/19 12:22 AM   Specimen: BLOOD  Result Value Ref Range Status   Specimen Description BLOOD BLOOD RIGHT WRIST  Final   Special Requests   Final    BOTTLES DRAWN AEROBIC AND ANAEROBIC Blood Culture adequate volume   Culture   Final    NO GROWTH 5 DAYS Performed at [facility], [address], [place], [place] [address]    Report Status 09/15/2019 FINAL  Final  Blood culture (routine  x 2)     Status: None   Collection Time: 09/10/19 12:28 AM   Specimen: BLOOD  Result Value Ref Range Status   Specimen Description BLOOD RIGHT ANTECUBITAL  Final   Special Requests   Final     BOTTLES DRAWN AEROBIC AND ANAEROBIC Blood Culture adequate volume   Culture   Final    NO GROWTH 5 DAYS Performed at [facility], [address], [place], [place] [address]    Report Status 09/15/2019 FINAL  Final  Urine Culture     Status: Abnormal   Collection Time: 09/10/19  1:29 AM   Specimen: Urine, Random  Result Value Ref Range Status   Specimen Description   Final    URINE, RANDOM Performed at [facility], [address] [place], [place] [address]    Special Requests   Final    NONE Performed at [facility], [address] [place], [place] [address]    Culture MULTIPLE SPECIES PRESENT, SUGGEST RECOLLECTION (A)  Final   Report Status 09/11/2019 FINAL  Final  Respiratory Panel by RT PCR (Flu A&B, Covid) - Nasopharyngeal Swab     Status: None   Collection Time: 09/10/19  1:29 AM   Specimen: Nasopharyngeal Swab  Result Value Ref Range Status   SARS Coronavirus 2 by RT PCR NEGATIVE NEGATIVE Final    Comment: (NOTE) SARS-CoV-2 target nucleic acids are NOT DETECTED. The SARS-CoV-2 RNA is generally detectable in upper respiratoy specimens during the acute phase of infection. The lowest concentration of SARS-CoV-2 viral copies this assay can detect is 131 copies/mL. A negative result does not preclude SARS-Cov-2 infection and should not be used as the sole basis for treatment or other patient management decisions. A negative result may occur with  improper specimen collection/handling, submission of specimen other than nasopharyngeal swab, presence of viral mutation(s) within the areas targeted by this assay, and inadequate number of viral copies (<131 copies/mL). A negative result must be combined with clinical observations, patient history, and epidemiological information. The expected result is Negative. Fact Sheet for Patients:  PinkCheek.be Fact Sheet for Healthcare Providers:  GravelBags.it This test is not yet  ap proved or cleared by the [place] FDA and  has been authorized for detection and/or diagnosis of SARS-CoV-2 by FDA under an Emergency Use Authorization (EUA). This EUA will remain  in effect (meaning this test can be used) for the duration of the COVID-19 declaration under Section 564(b)(1) of the Act, 21 U.S.C. section 360bbb-3(b)(1), unless the authorization is terminated or revoked sooner.    Influenza A by PCR NEGATIVE NEGATIVE Final   Influenza B by PCR NEGATIVE NEGATIVE Final    Comment: (NOTE) The Xpert Xpress SARS-CoV-2/FLU/RSV assay is intended as an aid in  the diagnosis of influenza from Nasopharyngeal swab specimens and  should not be used as a sole basis for treatment. Nasal washings and  aspirates are unacceptable for Xpert Xpress SARS-CoV-2/FLU/RSV  testing. Fact Sheet for Patients: PinkCheek.be Fact Sheet for Healthcare Providers: GravelBags.it This test is not yet approved or cleared by the [place] FDA and  has been authorized for detection and/or diagnosis of SARS-CoV-2 by  FDA under an Emergency Use Authorization (EUA). This EUA will remain  in effect (meaning this test can be used) for the duration of the  Covid-19 declaration under Section 564(b)(1) of the Act, 21  U.S.C. section 360bbb-3(b)(1), unless the authorization is  terminated or revoked. Performed at [facility], [address], [place], [place] [address]  GI pathogen panel by PCR, stool     Status: None   Collection Time: 09/15/19  1:56 AM   Specimen: STOOL  Result Value Ref Range Status   Plesiomonas shigelloides NOT DETECTED NOT DETECTED Final   Yersinia enterocolitica NOT DETECTED NOT DETECTED Final   Vibrio NOT DETECTED NOT DETECTED Final   Enteropathogenic E coli NOT DETECTED NOT DETECTED Final   E coli (ETEC) LT/ST NOT DETECTED NOT DETECTED Final   E coli 0157 by PCR NOT DETECTED NOT DETECTED Final   Cryptosporidium by  PCR NOT DETECTED NOT DETECTED Final   Entamoeba histolytica NOT DETECTED NOT DETECTED Final   Adenovirus F 40/41 NOT DETECTED NOT DETECTED Final   Norovirus GI/GII NOT DETECTED NOT DETECTED Final   Sapovirus NOT DETECTED NOT DETECTED Final    Comment: (NOTE) Performed At: [facility] [place], [place] [id number] Rush Farmer MD LS:[id number]    Vibrio cholerae NOT DETECTED NOT DETECTED Final   Campylobacter by PCR NOT DETECTED NOT DETECTED Final   Salmonella by PCR NOT DETECTED NOT DETECTED Final   E coli (STEC) NOT DETECTED NOT DETECTED Final   Enteroaggregative E coli NOT DETECTED NOT DETECTED Final   Shigella by PCR NOT DETECTED NOT DETECTED Final   Cyclospora cayetanensis NOT DETECTED NOT DETECTED Final   Astrovirus NOT DETECTED NOT DETECTED Final   G lamblia by PCR NOT DETECTED NOT DETECTED Final   Rotavirus A by PCR NOT DETECTED NOT DETECTED Final  C difficile quick scan w PCR reflex     Status: None   Collection Time: 09/15/19  1:56 AM   Specimen: STOOL  Result Value Ref Range Status   C Diff antigen NEGATIVE NEGATIVE Final   C Diff toxin NEGATIVE NEGATIVE Final   C Diff interpretation No C. difficile detected.  Final    Comment: Performed at [facility], [place] [address], [place], [place] [address]  SARS CORONAVIRUS 2 (TAT 6-24 HRS) Nasopharyngeal Nasopharyngeal Swab     Status: None   Collection Time: 09/15/19  9:27 PM   Specimen: Nasopharyngeal Swab  Result Value Ref Range Status   SARS Coronavirus 2 NEGATIVE NEGATIVE Final    Comment: (NOTE) SARS-CoV-2 target nucleic acids are NOT DETECTED. The SARS-CoV-2 RNA is generally detectable in upper and lower respiratory specimens during the acute phase of infection. Negative results do not preclude SARS-CoV-2 infection, do not rule out co-infections with other pathogens, and should not be used as the sole basis for treatment or other patient management decisions. Negative results must be  combined with clinical observations, patient history, and epidemiological information. The expected result is Negative. Fact Sheet for Patients: SugarRoll.be Fact Sheet for Healthcare Providers: https://www.woods-mathews.com/ This test is not yet approved or cleared by the [place] FDA and  has been authorized for detection and/or diagnosis of SARS-CoV-2 by FDA under an Emergency Use Authorization (EUA). This EUA will remain  in effect (meaning this test can be used) for the duration of the COVID-19 declaration under Section 56 4(b)(1) of the Act, 21 U.S.C. section 360bbb-3(b)(1), unless the authorization is terminated or revoked sooner. Performed at [facility], [place] [address], [place], [place] [address]      Time coordinating discharge: 25  minutes  SIGNED: Antonieta Pert, MD  [facility] 09/19/2019, 11:39 AM  If 7PM-7AM, please contact night-coverage [email or web address]

## 2019-09-19 NOTE — Progress Notes (Signed)
Educated patient on discharge instructions and follow up appointments. Questions answered. Told her I would go over discharge papers with daughter as well. Called daughter Laurie Panda x2 with no answer, left HIPPA appropriate message and call back number. I cannot call [facility] until I am able to talk with daughter. Patient cannot be transported unless someone is at the home. Continue to monitor and try to contact daughter.

## 2019-09-21 DIAGNOSIS — Z992 Dependence on renal dialysis: Secondary | ICD-10-CM | POA: Diagnosis not present

## 2019-09-21 DIAGNOSIS — D631 Anemia in chronic kidney disease: Secondary | ICD-10-CM | POA: Diagnosis not present

## 2019-09-21 DIAGNOSIS — N186 End stage renal disease: Secondary | ICD-10-CM | POA: Diagnosis not present

## 2019-09-21 DIAGNOSIS — D509 Iron deficiency anemia, unspecified: Secondary | ICD-10-CM | POA: Diagnosis not present

## 2019-09-21 LAB — VITAMIN B1: Vitamin B1 (Thiamine): 99 nmol/L (ref 66.5–200.0)

## 2019-09-22 ENCOUNTER — Ambulatory Visit: Payer: Self-pay | Admitting: Physician Assistant

## 2019-09-23 DIAGNOSIS — D509 Iron deficiency anemia, unspecified: Secondary | ICD-10-CM | POA: Diagnosis not present

## 2019-09-23 DIAGNOSIS — D631 Anemia in chronic kidney disease: Secondary | ICD-10-CM | POA: Diagnosis not present

## 2019-09-23 DIAGNOSIS — Z992 Dependence on renal dialysis: Secondary | ICD-10-CM | POA: Diagnosis not present

## 2019-09-23 DIAGNOSIS — N186 End stage renal disease: Secondary | ICD-10-CM | POA: Diagnosis not present

## 2019-09-25 DIAGNOSIS — D631 Anemia in chronic kidney disease: Secondary | ICD-10-CM | POA: Diagnosis not present

## 2019-09-25 DIAGNOSIS — D509 Iron deficiency anemia, unspecified: Secondary | ICD-10-CM | POA: Diagnosis not present

## 2019-09-25 DIAGNOSIS — Z992 Dependence on renal dialysis: Secondary | ICD-10-CM | POA: Diagnosis not present

## 2019-09-25 DIAGNOSIS — N186 End stage renal disease: Secondary | ICD-10-CM | POA: Diagnosis not present

## 2019-09-28 DIAGNOSIS — Z992 Dependence on renal dialysis: Secondary | ICD-10-CM | POA: Diagnosis not present

## 2019-09-28 DIAGNOSIS — D631 Anemia in chronic kidney disease: Secondary | ICD-10-CM | POA: Diagnosis not present

## 2019-09-28 DIAGNOSIS — D509 Iron deficiency anemia, unspecified: Secondary | ICD-10-CM | POA: Diagnosis not present

## 2019-09-28 DIAGNOSIS — N186 End stage renal disease: Secondary | ICD-10-CM | POA: Diagnosis not present

## 2019-09-29 ENCOUNTER — Other Ambulatory Visit: Payer: Self-pay | Admitting: *Deleted

## 2019-09-29 DIAGNOSIS — N186 End stage renal disease: Secondary | ICD-10-CM

## 2019-09-30 ENCOUNTER — Encounter: Payer: Self-pay | Admitting: Physician Assistant

## 2019-09-30 ENCOUNTER — Ambulatory Visit: Payer: Self-pay | Admitting: Physician Assistant

## 2019-09-30 ENCOUNTER — Other Ambulatory Visit: Payer: Self-pay

## 2019-09-30 ENCOUNTER — Ambulatory Visit (HOSPITAL_COMMUNITY): Payer: Medicaid Other

## 2019-09-30 VITALS — BP 130/84 | HR 89 | Temp 98.2°F

## 2019-09-30 DIAGNOSIS — Z7689 Persons encountering health services in other specified circumstances: Secondary | ICD-10-CM

## 2019-09-30 DIAGNOSIS — N186 End stage renal disease: Secondary | ICD-10-CM | POA: Diagnosis not present

## 2019-09-30 DIAGNOSIS — I3139 Other pericardial effusion (noninflammatory): Secondary | ICD-10-CM

## 2019-09-30 DIAGNOSIS — I313 Pericardial effusion (noninflammatory): Secondary | ICD-10-CM

## 2019-09-30 DIAGNOSIS — E1121 Type 2 diabetes mellitus with diabetic nephropathy: Secondary | ICD-10-CM

## 2019-09-30 DIAGNOSIS — Z992 Dependence on renal dialysis: Secondary | ICD-10-CM | POA: Diagnosis not present

## 2019-09-30 DIAGNOSIS — D631 Anemia in chronic kidney disease: Secondary | ICD-10-CM | POA: Diagnosis not present

## 2019-09-30 DIAGNOSIS — G4700 Insomnia, unspecified: Secondary | ICD-10-CM

## 2019-09-30 DIAGNOSIS — R531 Weakness: Secondary | ICD-10-CM

## 2019-09-30 DIAGNOSIS — D509 Iron deficiency anemia, unspecified: Secondary | ICD-10-CM | POA: Diagnosis not present

## 2019-09-30 MED ORDER — ZOLPIDEM TARTRATE 5 MG PO TABS: 5.0000 mg | ORAL_TABLET | Freq: Every evening | ORAL | 0 refills | Status: DC | PRN

## 2019-09-30 NOTE — Progress Notes (Signed)
I got her scheduled for 11/04/19.

## 2019-09-30 NOTE — Progress Notes (Signed)
Am I scheduling for next available?

## 2019-09-30 NOTE — Progress Notes (Signed)
BP 130/84   Pulse 89   Temp 98.2 F (36.8 C)   LMP 12/17/2016   SpO2 92%    Subjective:    Patient ID: Melanie Hall, female    DOB: [birth year], 49 y.o.   MRN: [id number]  HPI: Melanie Hall is a 49 y.o. female presenting on 09/30/2019 for New Patient (Initial Visit) (pt is here today with daughter Melanie Hall. previous pt at [facility] pt was last seen there 02/2019. pt states she does not plan to go back as she is not able to afford to go there)   HPI  Pt had negative covid 19 screening questionnaire.    Pt is 85yoF with uncontrolled DM, ESRD,  Chronic back pain,  HTN,  Urinary and fecal incontinence,  Weakness with impaired ability to ambulate who presents to office today to establish care.   Pt's daughter is with her today.    Pt had appointment to establish care here in 2019 but she no-showed to 3 scheduled appointments and has never been seen in this office prior to today.    She says she was pt at [facility] but hasn't been seen there since august.     Pt getting around with rolling walker.  She says she Is tired.  She isn't sleeping.  She just is awake.  She doesn't know why.   She was tried on trazodone but says it didn't work.  She was on [place] in hospital and says it worked well.    She is just started on zoloft while in hospital to help with mood.  It is doing good to stabilize mood.     Pt states sits up to sleep for 3 years due to chest pains.  The pain has not changed in that time frame    Pt had echo in feburary.  She had mild pericardial effusion.  Pt doesn't think she has ever seen a cardiologist.  Pt is using Some unknown kind of insulin 10 units bid - daughter doesn't know what kind but says it is cloudy.    They don't check her blood sugar ever.    a1c was 7.2 on 09/10/19.  Pt gets dialysis 3 times/week.  She is living with her daughter in [place] and her 2 grandchildren.    Pt has applied for medicaid but doesn't know if she got approved yet because they haven't  been to her home to check her mail for some time.  They are planning to go do that today after her appointment here today.    Relevant past medical, surgical, family and social history reviewed and updated as indicated. Interim medical history since our last visit reviewed. Allergies and medications reviewed and updated.   Current Outpatient Medications:  .  amLODipine (NORVASC) 5 MG tablet, Take 5 mg by mouth daily., Disp: , Rfl:  .  carvedilol (COREG) 6.25 MG tablet, Take 1 tablet (6.25 mg total) by mouth 2 (two) times daily with a meal., Disp: 60 tablet, Rfl: 1 .  epoetin alfa (EPOGEN) 3000 UNIT/ML injection, Inject 3,000 Units into the skin every 14 (fourteen) days. , Disp: , Rfl:  .  gabapentin (NEURONTIN) 300 MG capsule, Take 1 capsule (300 mg total) by mouth at bedtime., Disp: 30 capsule, Rfl: 0 .  levothyroxine (SYNTHROID) 25 MCG tablet, Take 1 tablet (25 mcg total) by mouth daily., Disp: 30 tablet, Rfl: 0 .  lovastatin (MEVACOR) 20 MG tablet, Take 40 mg by mouth at bedtime., Disp: , Rfl:  .  multivitamin (RENA-VIT) TABS tablet, Take 1 tablet by mouth at bedtime., Disp: 30 tablet, Rfl: 0 .  pantoprazole (PROTONIX) 40 MG tablet, Take 1 tablet (40 mg total) by mouth daily., Disp: 30 tablet, Rfl: 0 .  sertraline (ZOLOFT) 25 MG tablet, Take 1 tablet (25 mg total) by mouth daily., Disp: 30 tablet, Rfl: 0 .  tamsulosin (FLOMAX) 0.4 MG CAPS capsule, Take 1 capsule (0.4 mg total) by mouth daily after supper., Disp: 30 capsule, Rfl: 1 .  lovastatin (MEVACOR) 40 MG tablet, Take 1 tablet (40 mg total) by mouth at bedtime. (Patient not taking: Reported on 09/30/2019), Disp: 30 tablet, Rfl: 0    Review of Systems  Per HPI unless specifically indicated above     Objective:    BP 130/84   Pulse 89   Temp 98.2 F (36.8 C)   LMP 12/17/2016   SpO2 92%   Wt Readings from Last 3 Encounters:  09/18/19 154 lb 1.6 oz (69.9 kg)  08/25/19 170 lb 3.1 oz (77.2 kg)  07/23/19 148 lb (67.1 kg)     Physical Exam Vitals reviewed.  Constitutional:      General: She is not in acute distress.    Appearance: She is ill-appearing.     Comments: Pt appears older than stated age and chronically ill.   HENT:     Head: Normocephalic and atraumatic.  Eyes:     Pupils: Pupils are equal, round, and reactive to light.  Neck:     Thyroid: No thyromegaly.  Cardiovascular:     Rate and Rhythm: Normal rate and regular rhythm.  Pulmonary:     Effort: Pulmonary effort is normal.     Breath sounds: Normal breath sounds.  Abdominal:     General: Bowel sounds are normal.     Palpations: Abdomen is soft. There is no mass.     Tenderness: There is no abdominal tenderness.  Genitourinary:    Comments: Pt with catheter in place and bag for urine collection. Musculoskeletal:     Cervical back: Neck supple.     Right lower leg: No edema.     Left lower leg: No edema.  Lymphadenopathy:     Cervical: No cervical adenopathy.  Skin:    General: Skin is warm and dry.  Neurological:     Mental Status: She is alert and oriented to person, place, and time.     Gait: Gait abnormal.     Comments: Pt with slow, shuffling gait using rolling walker.  Psychiatric:        Behavior: Behavior normal. Behavior is cooperative.            Assessment & Plan:   Encounter Diagnoses  Name Primary?  . Encounter to establish care Yes  . ESRD (end stage renal disease) on dialysis ([place])   . Type 2 diabetes with nephropathy ([place])   . Weakness   . Insomnia, unspecified type   . Pericardial effusion      -will try to get pt on sleep cycle with sleeping at night and awake during the day.  Will Try Lorrin Mais that she did well with while in hospital.  Pt says she didn't have any effect from trazodone but did well with ambien 5mg  which she had while she was in the hospital.  Counseled daughter to watch for oversedation or other problems.   -pt counseled to get shoes/slippers that are less likely to make her trip  and fall  -Echo repeat-  effusion  -Pt  given handicapped [place]qu[place] application for DMV  -pt to continue with dialysis and nephrologist -she has appointment to follow up with urologist -pt says dialysis center is getting her a covid vaccination  -pt to check on her medicaid  -will schedule virtual follow up for 1 month.  She is to contact office sooner prn

## 2019-09-30 NOTE — Progress Notes (Signed)
See his note about scheduling

## 2019-10-02 ENCOUNTER — Encounter (HOSPITAL_COMMUNITY): Payer: Self-pay

## 2019-10-02 DIAGNOSIS — Z992 Dependence on renal dialysis: Secondary | ICD-10-CM | POA: Diagnosis not present

## 2019-10-02 DIAGNOSIS — N186 End stage renal disease: Secondary | ICD-10-CM | POA: Diagnosis not present

## 2019-10-02 DIAGNOSIS — D509 Iron deficiency anemia, unspecified: Secondary | ICD-10-CM | POA: Diagnosis not present

## 2019-10-02 DIAGNOSIS — D631 Anemia in chronic kidney disease: Secondary | ICD-10-CM | POA: Diagnosis not present

## 2019-10-05 DIAGNOSIS — D509 Iron deficiency anemia, unspecified: Secondary | ICD-10-CM | POA: Diagnosis not present

## 2019-10-05 DIAGNOSIS — Z992 Dependence on renal dialysis: Secondary | ICD-10-CM | POA: Diagnosis not present

## 2019-10-05 DIAGNOSIS — D631 Anemia in chronic kidney disease: Secondary | ICD-10-CM | POA: Diagnosis not present

## 2019-10-05 DIAGNOSIS — N186 End stage renal disease: Secondary | ICD-10-CM | POA: Diagnosis not present

## 2019-10-07 DIAGNOSIS — N186 End stage renal disease: Secondary | ICD-10-CM | POA: Diagnosis not present

## 2019-10-07 DIAGNOSIS — D631 Anemia in chronic kidney disease: Secondary | ICD-10-CM | POA: Diagnosis not present

## 2019-10-07 DIAGNOSIS — D509 Iron deficiency anemia, unspecified: Secondary | ICD-10-CM | POA: Diagnosis not present

## 2019-10-07 DIAGNOSIS — Z992 Dependence on renal dialysis: Secondary | ICD-10-CM | POA: Diagnosis not present

## 2019-10-08 DIAGNOSIS — I639 Cerebral infarction, unspecified: Secondary | ICD-10-CM

## 2019-10-08 HISTORY — DX: Cerebral infarction, unspecified: I63.9

## 2019-10-09 ENCOUNTER — Other Ambulatory Visit (HOSPITAL_COMMUNITY): Payer: Self-pay

## 2019-10-09 DIAGNOSIS — T82898A Other specified complication of vascular prosthetic devices, implants and grafts, initial encounter: Secondary | ICD-10-CM | POA: Diagnosis not present

## 2019-10-09 DIAGNOSIS — N186 End stage renal disease: Secondary | ICD-10-CM | POA: Diagnosis not present

## 2019-10-09 DIAGNOSIS — D509 Iron deficiency anemia, unspecified: Secondary | ICD-10-CM | POA: Diagnosis not present

## 2019-10-09 DIAGNOSIS — D631 Anemia in chronic kidney disease: Secondary | ICD-10-CM | POA: Diagnosis not present

## 2019-10-09 DIAGNOSIS — Z992 Dependence on renal dialysis: Secondary | ICD-10-CM | POA: Diagnosis not present

## 2019-10-09 DIAGNOSIS — T82848A Pain from vascular prosthetic devices, implants and grafts, initial encounter: Secondary | ICD-10-CM | POA: Diagnosis not present

## 2019-10-12 DIAGNOSIS — Z794 Long term (current) use of insulin: Secondary | ICD-10-CM | POA: Diagnosis not present

## 2019-10-12 DIAGNOSIS — Z992 Dependence on renal dialysis: Secondary | ICD-10-CM | POA: Diagnosis not present

## 2019-10-12 DIAGNOSIS — T82848A Pain from vascular prosthetic devices, implants and grafts, initial encounter: Secondary | ICD-10-CM | POA: Diagnosis not present

## 2019-10-12 DIAGNOSIS — D509 Iron deficiency anemia, unspecified: Secondary | ICD-10-CM | POA: Diagnosis not present

## 2019-10-12 DIAGNOSIS — E119 Type 2 diabetes mellitus without complications: Secondary | ICD-10-CM | POA: Diagnosis not present

## 2019-10-12 DIAGNOSIS — D631 Anemia in chronic kidney disease: Secondary | ICD-10-CM | POA: Diagnosis not present

## 2019-10-12 DIAGNOSIS — N186 End stage renal disease: Secondary | ICD-10-CM | POA: Diagnosis not present

## 2019-10-12 DIAGNOSIS — T82898A Other specified complication of vascular prosthetic devices, implants and grafts, initial encounter: Secondary | ICD-10-CM | POA: Diagnosis not present

## 2019-10-13 ENCOUNTER — Other Ambulatory Visit: Payer: Self-pay

## 2019-10-13 ENCOUNTER — Ambulatory Visit (HOSPITAL_COMMUNITY)
Admission: RE | Admit: 2019-10-13 | Discharge: 2019-10-13 | Disposition: A | Discharge status: Home / Self Care | Payer: Medicaid Other | Source: Ambulatory Visit | Attending: Physician Assistant | Admitting: Physician Assistant

## 2019-10-13 DIAGNOSIS — I3139 Other pericardial effusion (noninflammatory): Secondary | ICD-10-CM

## 2019-10-13 DIAGNOSIS — I313 Pericardial effusion (noninflammatory): Secondary | ICD-10-CM | POA: Diagnosis not present

## 2019-10-13 NOTE — Progress Notes (Signed)
*  PRELIMINARY RESULTS* Echocardiogram 2D Echocardiogram has been performed.  Melanie Hall 10/13/2019, 12:46 PM

## 2019-10-14 DIAGNOSIS — D631 Anemia in chronic kidney disease: Secondary | ICD-10-CM | POA: Diagnosis not present

## 2019-10-14 DIAGNOSIS — T82898A Other specified complication of vascular prosthetic devices, implants and grafts, initial encounter: Secondary | ICD-10-CM | POA: Diagnosis not present

## 2019-10-14 DIAGNOSIS — N186 End stage renal disease: Secondary | ICD-10-CM | POA: Diagnosis not present

## 2019-10-14 DIAGNOSIS — T82848A Pain from vascular prosthetic devices, implants and grafts, initial encounter: Secondary | ICD-10-CM | POA: Diagnosis not present

## 2019-10-14 DIAGNOSIS — D509 Iron deficiency anemia, unspecified: Secondary | ICD-10-CM | POA: Diagnosis not present

## 2019-10-14 DIAGNOSIS — Z992 Dependence on renal dialysis: Secondary | ICD-10-CM | POA: Diagnosis not present

## 2019-10-16 DIAGNOSIS — D631 Anemia in chronic kidney disease: Secondary | ICD-10-CM | POA: Diagnosis not present

## 2019-10-16 DIAGNOSIS — T82848A Pain from vascular prosthetic devices, implants and grafts, initial encounter: Secondary | ICD-10-CM | POA: Diagnosis not present

## 2019-10-16 DIAGNOSIS — T82898A Other specified complication of vascular prosthetic devices, implants and grafts, initial encounter: Secondary | ICD-10-CM | POA: Diagnosis not present

## 2019-10-16 DIAGNOSIS — Z992 Dependence on renal dialysis: Secondary | ICD-10-CM | POA: Diagnosis not present

## 2019-10-16 DIAGNOSIS — D509 Iron deficiency anemia, unspecified: Secondary | ICD-10-CM | POA: Diagnosis not present

## 2019-10-16 DIAGNOSIS — N186 End stage renal disease: Secondary | ICD-10-CM | POA: Diagnosis not present

## 2019-10-18 ENCOUNTER — Encounter (HOSPITAL_COMMUNITY)
Admission: EM | Disposition: A | Discharge status: Home / Self Care | Payer: Self-pay | Source: Home / Self Care | Attending: Cardiovascular Disease

## 2019-10-18 ENCOUNTER — Encounter (HOSPITAL_COMMUNITY): Payer: Self-pay | Admitting: Emergency Medicine

## 2019-10-18 ENCOUNTER — Inpatient Hospital Stay (HOSPITAL_COMMUNITY)
Admission: EM | Admit: 2019-10-18 | Discharge: 2019-10-22 | DRG: 246 | Disposition: A | Discharge status: Home / Self Care | Payer: Medicaid Other | Attending: Cardiovascular Disease | Admitting: Cardiovascular Disease

## 2019-10-18 ENCOUNTER — Emergency Department (HOSPITAL_COMMUNITY): Payer: Medicaid Other

## 2019-10-18 ENCOUNTER — Other Ambulatory Visit: Payer: Self-pay

## 2019-10-18 DIAGNOSIS — D631 Anemia in chronic kidney disease: Secondary | ICD-10-CM | POA: Diagnosis present

## 2019-10-18 DIAGNOSIS — I340 Nonrheumatic mitral (valve) insufficiency: Secondary | ICD-10-CM | POA: Diagnosis present

## 2019-10-18 DIAGNOSIS — E7800 Pure hypercholesterolemia, unspecified: Secondary | ICD-10-CM | POA: Diagnosis present

## 2019-10-18 DIAGNOSIS — I2510 Atherosclerotic heart disease of native coronary artery without angina pectoris: Secondary | ICD-10-CM | POA: Diagnosis present

## 2019-10-18 DIAGNOSIS — Z79890 Hormone replacement therapy (postmenopausal): Secondary | ICD-10-CM | POA: Diagnosis not present

## 2019-10-18 DIAGNOSIS — Z794 Long term (current) use of insulin: Secondary | ICD-10-CM

## 2019-10-18 DIAGNOSIS — I152 Hypertension secondary to endocrine disorders: Secondary | ICD-10-CM | POA: Diagnosis not present

## 2019-10-18 DIAGNOSIS — E1122 Type 2 diabetes mellitus with diabetic chronic kidney disease: Secondary | ICD-10-CM | POA: Diagnosis present

## 2019-10-18 DIAGNOSIS — E1121 Type 2 diabetes mellitus with diabetic nephropathy: Secondary | ICD-10-CM | POA: Diagnosis present

## 2019-10-18 DIAGNOSIS — E782 Mixed hyperlipidemia: Secondary | ICD-10-CM | POA: Diagnosis not present

## 2019-10-18 DIAGNOSIS — Z803 Family history of malignant neoplasm of breast: Secondary | ICD-10-CM

## 2019-10-18 DIAGNOSIS — Z20822 Contact with and (suspected) exposure to covid-19: Secondary | ICD-10-CM | POA: Diagnosis not present

## 2019-10-18 DIAGNOSIS — Z833 Family history of diabetes mellitus: Secondary | ICD-10-CM

## 2019-10-18 DIAGNOSIS — Z8249 Family history of ischemic heart disease and other diseases of the circulatory system: Secondary | ICD-10-CM | POA: Diagnosis not present

## 2019-10-18 DIAGNOSIS — E1140 Type 2 diabetes mellitus with diabetic neuropathy, unspecified: Secondary | ICD-10-CM | POA: Diagnosis present

## 2019-10-18 DIAGNOSIS — E875 Hyperkalemia: Secondary | ICD-10-CM | POA: Diagnosis present

## 2019-10-18 DIAGNOSIS — I2111 ST elevation (STEMI) myocardial infarction involving right coronary artery: Principal | ICD-10-CM | POA: Diagnosis present

## 2019-10-18 DIAGNOSIS — E1169 Type 2 diabetes mellitus with other specified complication: Secondary | ICD-10-CM | POA: Diagnosis present

## 2019-10-18 DIAGNOSIS — I213 ST elevation (STEMI) myocardial infarction of unspecified site: Secondary | ICD-10-CM | POA: Diagnosis present

## 2019-10-18 DIAGNOSIS — Z79899 Other long term (current) drug therapy: Secondary | ICD-10-CM

## 2019-10-18 DIAGNOSIS — N186 End stage renal disease: Secondary | ICD-10-CM

## 2019-10-18 DIAGNOSIS — E1159 Type 2 diabetes mellitus with other circulatory complications: Secondary | ICD-10-CM | POA: Diagnosis present

## 2019-10-18 DIAGNOSIS — I5021 Acute systolic (congestive) heart failure: Secondary | ICD-10-CM

## 2019-10-18 DIAGNOSIS — I2119 ST elevation (STEMI) myocardial infarction involving other coronary artery of inferior wall: Secondary | ICD-10-CM

## 2019-10-18 DIAGNOSIS — Z955 Presence of coronary angioplasty implant and graft: Secondary | ICD-10-CM

## 2019-10-18 DIAGNOSIS — R079 Chest pain, unspecified: Secondary | ICD-10-CM | POA: Diagnosis not present

## 2019-10-18 DIAGNOSIS — Z808 Family history of malignant neoplasm of other organs or systems: Secondary | ICD-10-CM

## 2019-10-18 DIAGNOSIS — I255 Ischemic cardiomyopathy: Secondary | ICD-10-CM | POA: Diagnosis present

## 2019-10-18 DIAGNOSIS — Z87891 Personal history of nicotine dependence: Secondary | ICD-10-CM

## 2019-10-18 DIAGNOSIS — Z8673 Personal history of transient ischemic attack (TIA), and cerebral infarction without residual deficits: Secondary | ICD-10-CM

## 2019-10-18 DIAGNOSIS — E039 Hypothyroidism, unspecified: Secondary | ICD-10-CM | POA: Diagnosis present

## 2019-10-18 DIAGNOSIS — Z992 Dependence on renal dialysis: Secondary | ICD-10-CM

## 2019-10-18 DIAGNOSIS — I10 Essential (primary) hypertension: Secondary | ICD-10-CM | POA: Diagnosis present

## 2019-10-18 DIAGNOSIS — K219 Gastro-esophageal reflux disease without esophagitis: Secondary | ICD-10-CM | POA: Diagnosis present

## 2019-10-18 HISTORY — DX: ST elevation (STEMI) myocardial infarction involving right coronary artery: I21.11

## 2019-10-18 HISTORY — PX: LEFT HEART CATH AND CORONARY ANGIOGRAPHY: CATH118249

## 2019-10-18 HISTORY — DX: ST elevation (STEMI) myocardial infarction of unspecified site: I21.3

## 2019-10-18 HISTORY — PX: CORONARY STENT INTERVENTION: CATH118234

## 2019-10-18 HISTORY — PX: CORONARY/GRAFT ACUTE MI REVASCULARIZATION: CATH118305

## 2019-10-18 HISTORY — PX: INTRAVASCULAR ULTRASOUND/IVUS: CATH118244

## 2019-10-18 LAB — CBC
HCT: 38.4 % (ref 36.0–46.0)
HCT: 33.6 % — ABNORMAL LOW (ref 36.0–46.0)
Hemoglobin: 11.8 g/dL — ABNORMAL LOW (ref 12.0–15.0)
Hemoglobin: 10.3 g/dL — ABNORMAL LOW (ref 12.0–15.0)
MCH: 30.7 pg (ref 26.0–34.0)
MCH: 30.7 pg (ref 26.0–34.0)
MCHC: 30.7 g/dL (ref 30.0–36.0)
MCHC: 30.7 g/dL (ref 30.0–36.0)
MCV: 100 fL (ref 80.0–100.0)
MCV: 100.3 fL — ABNORMAL HIGH (ref 80.0–100.0)
Platelets: 185 10*3/uL (ref 150–400)
Platelets: 140 10*3/uL — ABNORMAL LOW (ref 150–400)
RBC: 3.84 MIL/uL — ABNORMAL LOW (ref 3.87–5.11)
RBC: 3.35 MIL/uL — ABNORMAL LOW (ref 3.87–5.11)
RDW: 14.7 % (ref 11.5–15.5)
RDW: 14.8 % (ref 11.5–15.5)
WBC: 6.5 10*3/uL (ref 4.0–10.5)
WBC: 5.7 10*3/uL (ref 4.0–10.5)
nRBC: 0 % (ref 0.0–0.2)
nRBC: 0 % (ref 0.0–0.2)

## 2019-10-18 LAB — MRSA PCR SCREENING: MRSA by PCR: NEGATIVE

## 2019-10-18 LAB — BASIC METABOLIC PANEL
Anion gap: 14 (ref 5–15)
BUN: 28 mg/dL — ABNORMAL HIGH (ref 6–20)
CO2: 23 mmol/L (ref 22–32)
Calcium: 9.3 mg/dL (ref 8.9–10.3)
Chloride: 98 mmol/L (ref 98–111)
Creatinine, Ser: 3.95 mg/dL — ABNORMAL HIGH (ref 0.44–1.00)
GFR calc Af Amer: 15 mL/min — ABNORMAL LOW (ref >60)
GFR calc non Af Amer: 13 mL/min — ABNORMAL LOW (ref >60)
Glucose, Bld: 257 mg/dL — ABNORMAL HIGH (ref 70–99)
Potassium: 5.3 mmol/L — ABNORMAL HIGH (ref 3.5–5.1)
Sodium: 135 mmol/L (ref 135–145)

## 2019-10-18 LAB — RENAL FUNCTION PANEL
Albumin: 2.7 g/dL — ABNORMAL LOW (ref 3.5–5.0)
Anion gap: 14 (ref 5–15)
BUN: 30 mg/dL — ABNORMAL HIGH (ref 6–20)
CO2: 20 mmol/L — ABNORMAL LOW (ref 22–32)
Calcium: 8.7 mg/dL — ABNORMAL LOW (ref 8.9–10.3)
Chloride: 98 mmol/L (ref 98–111)
Creatinine, Ser: 4.03 mg/dL — ABNORMAL HIGH (ref 0.44–1.00)
GFR calc Af Amer: 14 mL/min — ABNORMAL LOW (ref >60)
GFR calc non Af Amer: 12 mL/min — ABNORMAL LOW (ref >60)
Glucose, Bld: 197 mg/dL — ABNORMAL HIGH (ref 70–99)
Phosphorus: 5 mg/dL — ABNORMAL HIGH (ref 2.5–4.6)
Potassium: 5.2 mmol/L — ABNORMAL HIGH (ref 3.5–5.1)
Sodium: 132 mmol/L — ABNORMAL LOW (ref 135–145)

## 2019-10-18 LAB — LIPID PANEL
Cholesterol: 116 mg/dL (ref 0–200)
HDL: 38 mg/dL — ABNORMAL LOW (ref >40)
LDL Cholesterol: 63 mg/dL (ref 0–99)
Total CHOL/HDL Ratio: 3.1 RATIO
Triglycerides: 76 mg/dL (ref <150)
VLDL: 15 mg/dL (ref 0–40)

## 2019-10-18 LAB — GLUCOSE, CAPILLARY
Glucose-Capillary: 169 mg/dL — ABNORMAL HIGH (ref 70–99)
Glucose-Capillary: 188 mg/dL — ABNORMAL HIGH (ref 70–99)
Glucose-Capillary: 194 mg/dL — ABNORMAL HIGH (ref 70–99)
Glucose-Capillary: 213 mg/dL — ABNORMAL HIGH (ref 70–99)

## 2019-10-18 LAB — CBG MONITORING, ED: Glucose-Capillary: 213 mg/dL — ABNORMAL HIGH (ref 70–99)

## 2019-10-18 LAB — TROPONIN I (HIGH SENSITIVITY)
Troponin I (High Sensitivity): 948 ng/L (ref <18)
Troponin I (High Sensitivity): 7031 ng/L (ref <18)

## 2019-10-18 LAB — RESPIRATORY PANEL BY RT PCR (FLU A&B, COVID)
Influenza A by PCR: NEGATIVE
Influenza B by PCR: NEGATIVE
SARS Coronavirus 2 by RT PCR: NEGATIVE

## 2019-10-18 LAB — I-STAT BETA HCG BLOOD, ED (MC, WL, AP ONLY): I-stat hCG, quantitative: 9 m[IU]/mL — ABNORMAL HIGH (ref <5)

## 2019-10-18 LAB — PROTIME-INR
INR: 1.1 (ref 0.8–1.2)
Prothrombin Time: 14.4 seconds (ref 11.4–15.2)

## 2019-10-18 LAB — CREATININE, SERUM
Creatinine, Ser: 3.92 mg/dL — ABNORMAL HIGH (ref 0.44–1.00)
GFR calc Af Amer: 15 mL/min — ABNORMAL LOW (ref >60)
GFR calc non Af Amer: 13 mL/min — ABNORMAL LOW (ref >60)

## 2019-10-18 LAB — APTT: aPTT: 33 seconds (ref 24–36)

## 2019-10-18 IMAGING — DX DG CHEST 1V PORT
1 series · 1 of 1 positions shown · non-contrast
Comparison: Chest radiograph dated [DATE].

CLINICAL DATA: 48-year-old female with left-sided chest pain.

EXAM:
PORTABLE CHEST 1 VIEW

[chest]
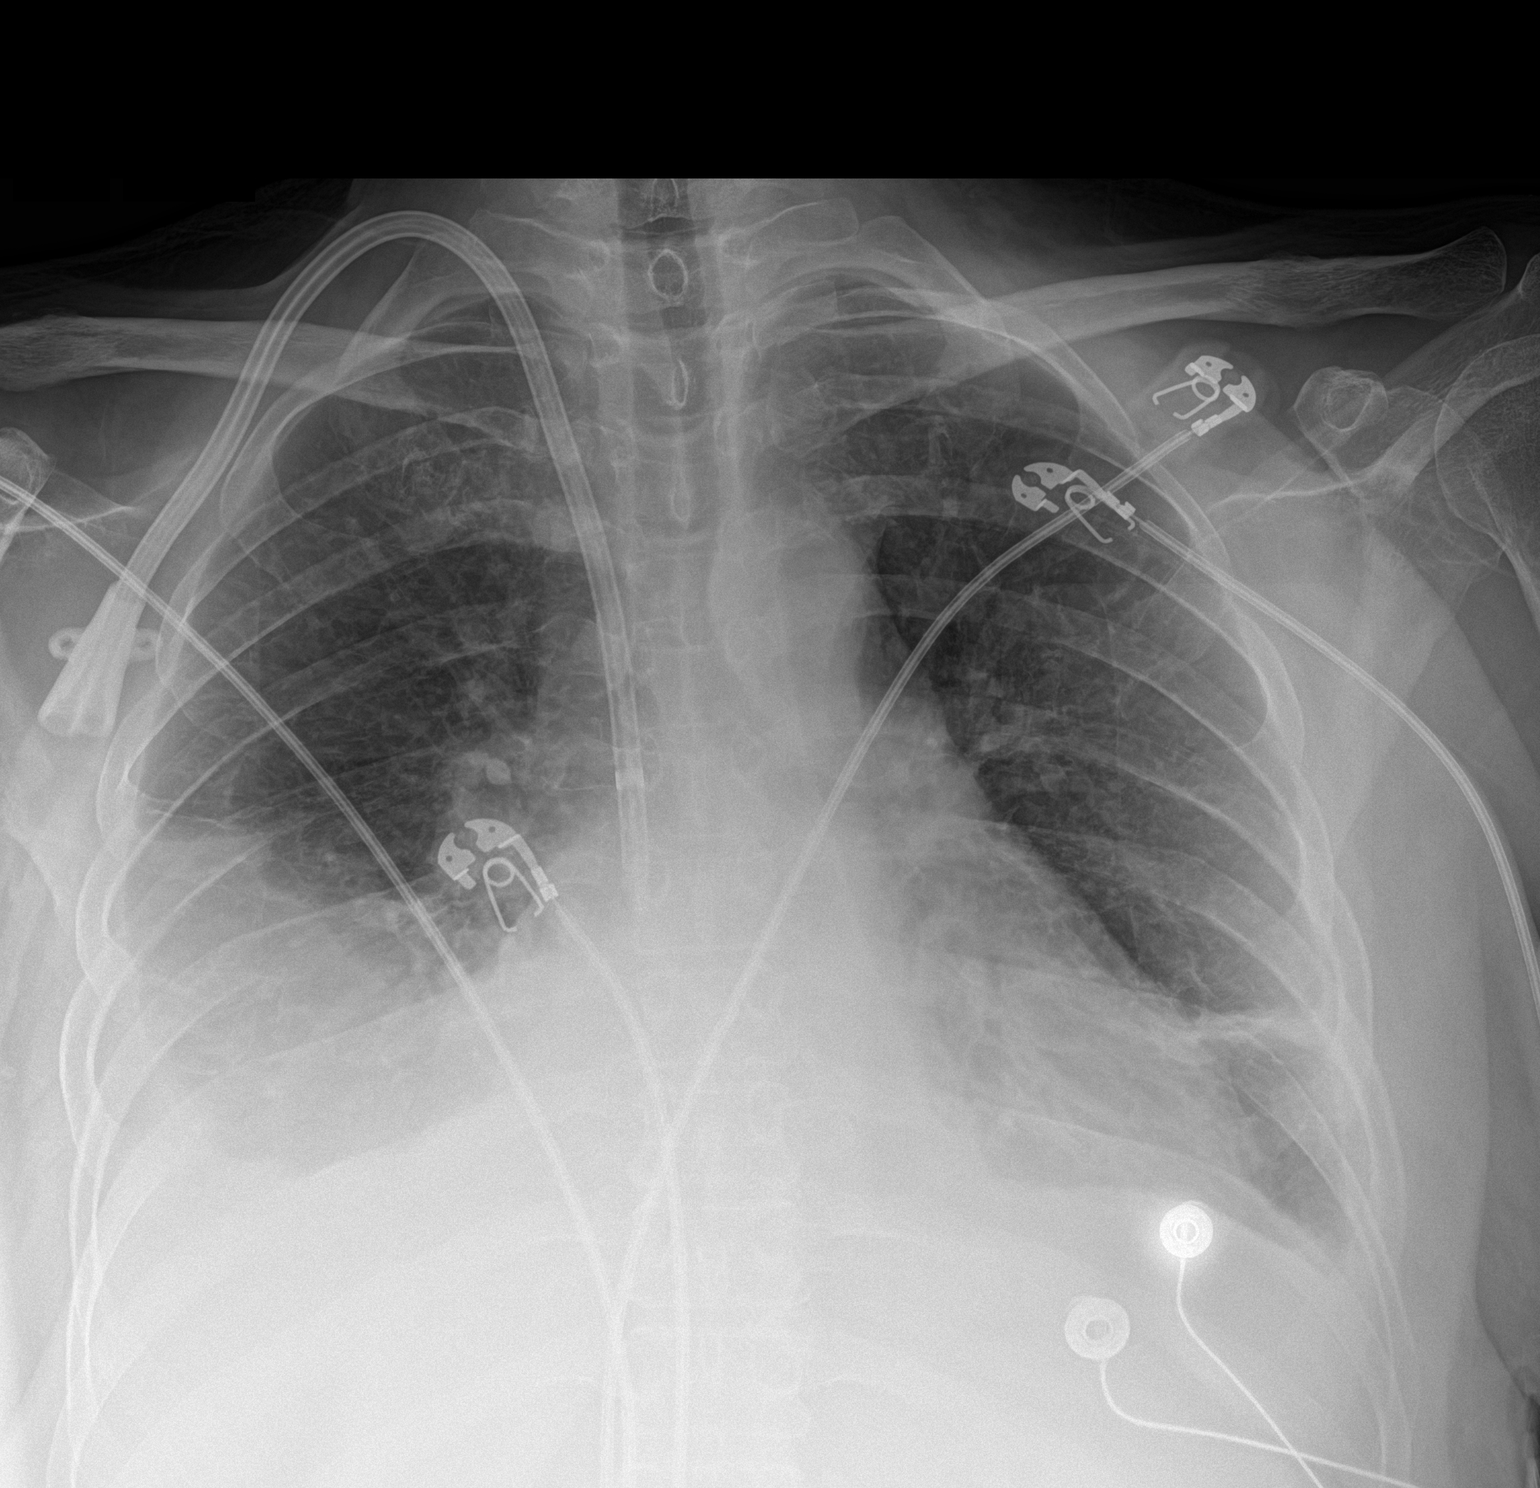

[1 of 1 positions shown; findings below may reference images not displayed]

FINDINGS: Dialysis catheter in similar position. Moderate right and small left
pleural effusion with associated compressive atelectasis of the lung
bases. Superimposed pneumonia is not excluded. Overall significant
interval improvement in the aeration of the lungs since the prior
radiograph with resolution of the previously seen vascular
congestion and edema. No pneumothorax. Stable cardiomegaly. No acute
osseous pathology.
IMPRESSION: 1. Resolution of the previously seen vascular congestion and edema.
2. Bilateral, right greater than left, pleural effusions with
associated compressive atelectasis of the lung bases.

## 2019-10-18 SURGERY — CORONARY/GRAFT ACUTE MI REVASCULARIZATION
Anesthesia: LOCAL

## 2019-10-18 MED ORDER — SODIUM CHLORIDE 0.9% FLUSH: 3.0000 mL | INTRAVENOUS | Status: DC | PRN

## 2019-10-18 MED ORDER — INSULIN ASPART 100 UNIT/ML ~~LOC~~ SOLN: 0.0000 [IU] | Freq: Every day | SUBCUTANEOUS | Status: DC

## 2019-10-18 MED ORDER — VERAPAMIL HCL 2.5 MG/ML IV SOLN
INTRAVENOUS | Status: DC | PRN
  Administered 2019-10-18 (×2): 200 ug via INTRACORONARY

## 2019-10-18 MED ORDER — ASPIRIN 81 MG PO CHEW
324.0000 mg | CHEWABLE_TABLET | Freq: Once | ORAL | Status: AC
  Administered 2019-10-18: 324 mg via ORAL

## 2019-10-18 MED ORDER — ATORVASTATIN CALCIUM 80 MG PO TABS
80.0000 mg | ORAL_TABLET | Freq: Every day | ORAL | Status: DC
  Administered 2019-10-18 – 2019-10-21 (×4): 80 mg via ORAL
  Filled 2019-10-18 (×4): qty 1

## 2019-10-18 MED ORDER — CARVEDILOL 6.25 MG PO TABS
6.2500 mg | ORAL_TABLET | Freq: Two times a day (BID) | ORAL | Status: DC
  Administered 2019-10-19 – 2019-10-22 (×7): 6.25 mg via ORAL
  Filled 2019-10-18 (×7): qty 1

## 2019-10-18 MED ORDER — CHLORHEXIDINE GLUCONATE CLOTH 2 % EX PADS
6.0000 | MEDICATED_PAD | Freq: Every day | CUTANEOUS | Status: DC
  Administered 2019-10-19 – 2019-10-22 (×3): 6 via TOPICAL

## 2019-10-18 MED ORDER — HEPARIN SODIUM (PORCINE) 5000 UNIT/ML IJ SOLN
60.0000 [IU]/kg | Freq: Once | INTRAMUSCULAR | Status: AC
  Administered 2019-10-18: 15:00:00 3600 [IU] via INTRAVENOUS

## 2019-10-18 MED ORDER — ALTEPLASE 2 MG IJ SOLR
2.0000 mg | Freq: Once | INTRAMUSCULAR | Status: DC | PRN
Start: 2019-10-18 — End: 2019-10-21

## 2019-10-18 MED ORDER — NITROGLYCERIN 0.4 MG SL SUBL
0.4000 mg | SUBLINGUAL_TABLET | SUBLINGUAL | Status: DC | PRN
  Administered 2019-10-20: 01:00:00 0.4 mg via SUBLINGUAL
  Filled 2019-10-18: qty 1

## 2019-10-18 MED ORDER — HEPARIN SODIUM (PORCINE) 1000 UNIT/ML DIALYSIS
1000.0000 [IU] | INTRAMUSCULAR | Status: DC | PRN
  Filled 2019-10-18: qty 1

## 2019-10-18 MED ORDER — SODIUM CHLORIDE 0.9% FLUSH
3.0000 mL | Freq: Two times a day (BID) | INTRAVENOUS | Status: DC
  Administered 2019-10-20 – 2019-10-22 (×4): 3 mL via INTRAVENOUS

## 2019-10-18 MED ORDER — INSULIN ASPART 100 UNIT/ML ~~LOC~~ SOLN
0.0000 [IU] | Freq: Three times a day (TID) | SUBCUTANEOUS | Status: DC
  Administered 2019-10-19: 2 [IU] via SUBCUTANEOUS
  Administered 2019-10-19 (×2): 1 [IU] via SUBCUTANEOUS
  Administered 2019-10-20: 2 [IU] via SUBCUTANEOUS
  Administered 2019-10-21 – 2019-10-22 (×2): 1 [IU] via SUBCUTANEOUS

## 2019-10-18 MED ORDER — PANTOPRAZOLE SODIUM 40 MG PO TBEC
40.0000 mg | DELAYED_RELEASE_TABLET | Freq: Every day | ORAL | Status: DC
  Administered 2019-10-19 – 2019-10-22 (×4): 40 mg via ORAL
  Filled 2019-10-18 (×4): qty 1

## 2019-10-18 MED ORDER — SODIUM CHLORIDE 0.9 % IV SOLN
INTRAVENOUS | Status: AC | PRN
  Administered 2019-10-18: 10 mL/h via INTRAVENOUS

## 2019-10-18 MED ORDER — EPOETIN ALFA 3000 UNIT/ML IJ SOLN
3000.0000 [IU] | Freq: Once | INTRAMUSCULAR | Status: AC
  Administered 2019-10-19: 17:00:00 3000 [IU] via SUBCUTANEOUS
  Filled 2019-10-18 (×2): qty 1

## 2019-10-18 MED ORDER — TICAGRELOR 90 MG PO TABS
ORAL_TABLET | ORAL | Status: DC | PRN
  Administered 2019-10-18: 180 mg via ORAL

## 2019-10-18 MED ORDER — SODIUM CHLORIDE 0.9% FLUSH: 3.0000 mL | Freq: Once | INTRAVENOUS | Status: DC

## 2019-10-18 MED ORDER — RENA-VITE PO TABS
1.0000 | ORAL_TABLET | Freq: Every day | ORAL | Status: DC
  Administered 2019-10-18 – 2019-10-21 (×4): 1 via ORAL
  Filled 2019-10-18 (×4): qty 1

## 2019-10-18 MED ORDER — ZOLPIDEM TARTRATE 5 MG PO TABS: 5.0000 mg | ORAL_TABLET | Freq: Every evening | ORAL | Status: DC | PRN

## 2019-10-18 MED ORDER — NITROGLYCERIN 1 MG/10 ML FOR IR/CATH LAB
INTRA_ARTERIAL | Status: AC
  Filled 2019-10-18: qty 10

## 2019-10-18 MED ORDER — FENTANYL CITRATE (PF) 100 MCG/2ML IJ SOLN
INTRAMUSCULAR | Status: DC | PRN
  Administered 2019-10-18: 25 ug via INTRAVENOUS

## 2019-10-18 MED ORDER — ACETAMINOPHEN 325 MG PO TABS
650.0000 mg | ORAL_TABLET | ORAL | Status: DC | PRN
  Administered 2019-10-18 – 2019-10-21 (×4): 650 mg via ORAL
  Filled 2019-10-18 (×4): qty 2

## 2019-10-18 MED ORDER — LIDOCAINE HCL (PF) 1 % IJ SOLN
INTRAMUSCULAR | Status: AC
  Filled 2019-10-18: qty 30

## 2019-10-18 MED ORDER — IOHEXOL 350 MG/ML SOLN
INTRAVENOUS | Status: DC | PRN
  Administered 2019-10-18: 125 mL

## 2019-10-18 MED ORDER — HYDRALAZINE HCL 20 MG/ML IJ SOLN: 10.0000 mg | INTRAMUSCULAR | Status: AC | PRN

## 2019-10-18 MED ORDER — MIDAZOLAM HCL 2 MG/2ML IJ SOLN
INTRAMUSCULAR | Status: AC
  Filled 2019-10-18: qty 2

## 2019-10-18 MED ORDER — SERTRALINE HCL 50 MG PO TABS
25.0000 mg | ORAL_TABLET | Freq: Every day | ORAL | Status: DC
  Administered 2019-10-19 – 2019-10-22 (×4): 25 mg via ORAL
  Filled 2019-10-18 (×4): qty 1

## 2019-10-18 MED ORDER — HEPARIN SODIUM (PORCINE) 1000 UNIT/ML DIALYSIS: 40.0000 [IU]/kg | INTRAMUSCULAR | Status: DC | PRN

## 2019-10-18 MED ORDER — LIDOCAINE HCL (PF) 1 % IJ SOLN
INTRAMUSCULAR | Status: DC | PRN
  Administered 2019-10-18: 10 mL

## 2019-10-18 MED ORDER — HEPARIN SODIUM (PORCINE) 1000 UNIT/ML DIALYSIS
20.0000 [IU]/kg | INTRAMUSCULAR | Status: DC | PRN
  Filled 2019-10-18: qty 2

## 2019-10-18 MED ORDER — ASPIRIN EC 81 MG PO TBEC
81.0000 mg | DELAYED_RELEASE_TABLET | Freq: Every day | ORAL | Status: DC
  Administered 2019-10-19 – 2019-10-22 (×4): 81 mg via ORAL
  Filled 2019-10-18 (×4): qty 1

## 2019-10-18 MED ORDER — HEPARIN (PORCINE) IN NACL 1000-0.9 UT/500ML-% IV SOLN
INTRAVENOUS | Status: DC | PRN
  Administered 2019-10-18 (×3): 500 mL

## 2019-10-18 MED ORDER — FENTANYL CITRATE (PF) 100 MCG/2ML IJ SOLN
INTRAMUSCULAR | Status: AC
  Filled 2019-10-18: qty 2

## 2019-10-18 MED ORDER — INSULIN ASPART 100 UNIT/ML ~~LOC~~ SOLN: 0.0000 [IU] | Freq: Three times a day (TID) | SUBCUTANEOUS | Status: DC

## 2019-10-18 MED ORDER — TICAGRELOR 90 MG PO TABS
90.0000 mg | ORAL_TABLET | Freq: Once | ORAL | Status: AC
  Administered 2019-10-19: 90 mg via ORAL
  Filled 2019-10-18: qty 1

## 2019-10-18 MED ORDER — HEPARIN (PORCINE) IN NACL 1000-0.9 UT/500ML-% IV SOLN
INTRAVENOUS | Status: AC
  Filled 2019-10-18: qty 1500

## 2019-10-18 MED ORDER — ONDANSETRON HCL 4 MG/2ML IJ SOLN: 4.0000 mg | Freq: Four times a day (QID) | INTRAMUSCULAR | Status: DC | PRN

## 2019-10-18 MED ORDER — SODIUM CHLORIDE 0.9 % IV SOLN
250.0000 mL | INTRAVENOUS | Status: DC | PRN
  Administered 2019-10-18: 19:00:00 250 mL via INTRAVENOUS

## 2019-10-18 MED ORDER — HEPARIN SODIUM (PORCINE) 1000 UNIT/ML IJ SOLN
INTRAMUSCULAR | Status: DC | PRN
  Administered 2019-10-18: 3000 [IU] via INTRAVENOUS
  Administered 2019-10-18: 2000 [IU] via INTRAVENOUS

## 2019-10-18 MED ORDER — TICAGRELOR 90 MG PO TABS
90.0000 mg | ORAL_TABLET | Freq: Two times a day (BID) | ORAL | Status: DC
  Administered 2019-10-19 – 2019-10-22 (×6): 90 mg via ORAL
  Filled 2019-10-18 (×7): qty 1

## 2019-10-18 MED ORDER — NITROGLYCERIN 1 MG/10 ML FOR IR/CATH LAB
INTRA_ARTERIAL | Status: DC | PRN
  Administered 2019-10-18 (×2): 100 ug via INTRACORONARY

## 2019-10-18 MED ORDER — LABETALOL HCL 5 MG/ML IV SOLN: 10.0000 mg | INTRAVENOUS | Status: AC | PRN

## 2019-10-18 MED ORDER — MIDAZOLAM HCL 2 MG/2ML IJ SOLN
INTRAMUSCULAR | Status: DC | PRN
  Administered 2019-10-18: 1 mg via INTRAVENOUS

## 2019-10-18 MED ORDER — VERAPAMIL HCL 2.5 MG/ML IV SOLN
INTRAVENOUS | Status: AC
  Filled 2019-10-18: qty 2

## 2019-10-18 MED ORDER — TICAGRELOR 90 MG PO TABS
ORAL_TABLET | ORAL | Status: AC
  Filled 2019-10-18: qty 2

## 2019-10-18 MED ORDER — HEPARIN SODIUM (PORCINE) 1000 UNIT/ML IJ SOLN
INTRAMUSCULAR | Status: AC
  Filled 2019-10-18: qty 1

## 2019-10-18 MED ORDER — IOHEXOL 350 MG/ML SOLN
INTRAVENOUS | Status: AC
  Filled 2019-10-18: qty 1

## 2019-10-18 MED ORDER — LEVOTHYROXINE SODIUM 25 MCG PO TABS
25.0000 ug | ORAL_TABLET | Freq: Every day | ORAL | Status: DC
  Administered 2019-10-19 – 2019-10-22 (×4): 25 ug via ORAL
  Filled 2019-10-18 (×4): qty 1

## 2019-10-18 MED ORDER — GABAPENTIN 300 MG PO CAPS
300.0000 mg | ORAL_CAPSULE | Freq: Every day | ORAL | Status: DC
  Administered 2019-10-18 – 2019-10-21 (×4): 300 mg via ORAL
  Filled 2019-10-18 (×4): qty 1

## 2019-10-18 MED ORDER — HEPARIN SODIUM (PORCINE) 5000 UNIT/ML IJ SOLN
5000.0000 [IU] | Freq: Three times a day (TID) | INTRAMUSCULAR | Status: DC
  Administered 2019-10-19 – 2019-10-21 (×8): 5000 [IU] via SUBCUTANEOUS
  Filled 2019-10-18 (×9): qty 1

## 2019-10-18 SURGICAL SUPPLY — 23 items
BALLN SAPPHIRE 2.0X12 (BALLOONS) ×2
BALLN SAPPHIRE ~~LOC~~ 3.75X15 (BALLOONS) ×2 IMPLANT
BALLOON SAPPHIRE 2.0X12 (BALLOONS) ×1 IMPLANT
CATH INFINITI 5FR MULTPACK ANG (CATHETERS) ×2 IMPLANT
CATH OPTICROSS HD (CATHETERS) ×2 IMPLANT
CATH VISTA GUIDE 6FR JR4 (CATHETERS) ×2 IMPLANT
DEVICE CLOSURE PERCLS PRGLD 6F (VASCULAR PRODUCTS) ×1 IMPLANT
GLIDESHEATH SLEND SS 6F .021 (SHEATH) IMPLANT
GUIDEWIRE INQWIRE 1.5J.035X260 (WIRE) IMPLANT
INQWIRE 1.5J .035X260CM (WIRE)
KIT ENCORE 26 ADVANTAGE (KITS) ×2 IMPLANT
KIT HEART LEFT (KITS) ×2 IMPLANT
PACK CARDIAC CATHETERIZATION (CUSTOM PROCEDURE TRAY) ×2 IMPLANT
PERCLOSE PROGLIDE 6F (VASCULAR PRODUCTS) ×2
SHEATH PINNACLE 6F 10CM (SHEATH) ×2 IMPLANT
SHEATH PROBE COVER 6X72 (BAG) ×2 IMPLANT
SLED PULL BACK IVUS (MISCELLANEOUS) ×2 IMPLANT
STENT RESOLUTE ONYX 3.5X18 (Permanent Stent) ×4 IMPLANT
SYR MEDRAD MARK 7 150ML (SYRINGE) ×2 IMPLANT
TRANSDUCER W/STOPCOCK (MISCELLANEOUS) ×2 IMPLANT
TUBING CIL FLEX 10 FLL-RA (TUBING) ×2 IMPLANT
WIRE COUGAR XT STRL 190CM (WIRE) ×2 IMPLANT
WIRE EMERALD 3MM-J .035X150CM (WIRE) ×2 IMPLANT

## 2019-10-18 NOTE — Consult Note (Signed)
Renal Service Consult Note [facility]  Melanie Hall 10/18/2019 Sol Blazing Requesting Physician:  Dr Burt Knack  Reason for Consult:  ESRD pt w/ AMI sp PCI HPI: The patient is a 49 y.o. year-old w/ hx of DM2, HTN and ESRD d/t DM2 (+biopsy Feb 2021) started HD in Feb 2021. Also was dx'd in Feb 2021 w/ polyradiculopathy from longstanding poorly controlled DM2 causing bilat LE weakness and numbness.  She has been getting HD now in [place] on MWF schedule. Pt presented w/ an acute inf MI.  Pt was taken to cath lab and 2 stents placed in the RCA. No sig other CAD noted. LVEDP was 19. Asked to see for HD.    Pt is groggy post procedure, no hx obtianed.    Past Medical History  Past Medical History:  Diagnosis Date  . Anemia   . Cataracts, bilateral    surgery to remove  . Diabetes mellitus    type 2  . ESRD on hemodialysis ([place])   . GERD (gastroesophageal reflux disease)   . Hyperlipidemia   . Hypertension   . Stroke ([place])   . SVD (spontaneous vaginal delivery)    x 4   Past Surgical History  Past Surgical History:  Procedure Laterality Date  . AV FISTULA PLACEMENT Left 08/17/2019   Procedure: LEFT BRACHIAL CEPHALIC ARTERIOVENOUS (AV) FISTULA;  Surgeon: Angelia Mould, MD;  Location: [place];  Service: Vascular;  Laterality: Left;  . EYE SURGERY Bilateral    cataracts removed  . FEMUR IM NAIL Left 10/28/2018   Procedure: RETROGRADE FEMORAL NAILING;  Surgeon: Meredith Pel, MD;  Location: [place];  Service: Orthopedics;  Laterality: Left;  . IM NAILING FEMORAL SHAFT RETROGRADE Left 10/28/2018  . IR FLUORO GUIDE CV LINE RIGHT  08/11/2019  . IR US GUIDE VASC ACCESS RIGHT  08/11/2019  . KNEE SURGERY Left   . RADIOLOGY WITH ANESTHESIA N/A 09/15/2019   Procedure: [facility] AND LUMBER LOWER BACK PAIN;  Surgeon: Radiologist, Medication, MD;  Location: [place];  Service: Radiology;  Laterality: N/A;  . TUBAL LIGATION     Family History  Family History   Problem Relation Age of Onset  . Diabetes Mother   . Hypertension Mother   . Bipolar disorder Mother   . Brain cancer Maternal Aunt   . Heart disease Maternal Grandmother   . Diabetes Maternal Grandmother   . Breast cancer Maternal Aunt   . Depression Father        Committed suicide  . Diabetes Sister    Social History  reports that she quit smoking about 24 years ago. Her smoking use included cigarettes. She has a 0.50 pack-year smoking history. She has never used smokeless tobacco. She reports that she does not drink alcohol or use drugs. Allergies No Known Allergies Home medications Prior to Admission medications   Medication Sig Start Date End Date Taking? Authorizing Provider  amLODipine (NORVASC) 5 MG tablet Take 5 mg by mouth daily.   Yes [provider]  carvedilol (COREG) 6.25 MG tablet Take 1 tablet (6.25 mg total) by mouth 2 (two) times daily with a meal. 09/19/19 11/18/19 Yes Kc, Maren Beach, MD  epoetin alfa (EPOGEN) 3000 UNIT/ML injection Inject 3,000 Units into the skin every 14 (fourteen) days.  06/12/19  Yes [provider]  gabapentin (NEURONTIN) 300 MG capsule Take 1 capsule (300 mg total) by mouth at bedtime. 09/19/19 10/19/19 Yes Antonieta Pert, MD  insulin aspart protamine- aspart (NOVOLOG MIX  70/30) (70-30) 100 UNIT/ML injection Inject 10 Units into the skin 2 (two) times daily with a meal.    Yes [provider]  lovastatin (MEVACOR) 20 MG tablet Take 40 mg by mouth at bedtime.   Yes [provider]  Multiple Vitamin (MULTI VITAMIN DAILY PO) Take 1 tablet by mouth daily.   Yes [provider]  multivitamin (RENA-VIT) TABS tablet Take 1 tablet by mouth at bedtime. 08/25/19  Yes Hoyt Koch, MD  sertraline (ZOLOFT) 25 MG tablet Take 1 tablet (25 mg total) by mouth daily. 09/19/19 10/19/19 Yes Antonieta Pert, MD  tamsulosin (FLOMAX) 0.4 MG CAPS capsule Take 1 capsule (0.4 mg total) by mouth daily after supper. 09/19/19 11/18/19 Yes Antonieta Pert, MD  zolpidem (AMBIEN) 5 MG tablet Take 1 tablet (5 mg total) by mouth at bedtime as needed for sleep. 09/30/19  Yes Soyla Dryer, PA-C  levothyroxine (SYNTHROID) 25 MCG tablet Take 1 tablet (25 mcg total) by mouth daily. Patient not taking: Reported on 10/18/2019 08/25/19   Hoyt Koch, MD  lovastatin (MEVACOR) 40 MG tablet Take 1 tablet (40 mg total) by mouth at bedtime. Patient not taking: Reported on 09/30/2019 08/25/19   Hoyt Koch, MD  pantoprazole (PROTONIX) 40 MG tablet Take 1 tablet (40 mg total) by mouth daily. Patient not taking: Reported on 10/18/2019 08/25/19 08/24/20  Hoyt Koch, MD     Vitals:   10/18/19 1800 10/18/19 1900 10/18/19 2000 10/18/19 2100  BP: 106/71 112/71 114/78 105/81  Pulse: 70 71 70 75  Resp: 17 20 17  (!) 22  Temp:   97.8 F (36.6 C)   TempSrc:   Oral   SpO2: 95% 95% 93% 93%  Weight:      Height:       Exam Gen groggy, sedated No rash, cyanosis or gangrene Sclera anicteric, throat not seen  No jvd or bruits Chest clear bilat to bases no rales RRR no MRG Abd soft ntnd no mass or ascites +bs GU defer MS no joint effusions or deformity Ext no LE or UE edema, no wounds or ulcers Neuro is lethargic , sedated no distress R IJ TDC, LUA AVF+bruit   Home meds:  - norvasc 5 / coreg 6.25 bid  - 70-30 insulin 10u bid  - flomax 0.4  - zoloft 25 qd/ gabapentin 300 hs   - synthroid 25 ug/ protonix 40 qd   - mevacor 40 hs  - prn's/ vitamins/ supplements   Outpt HD: MWF [place] [facility]   - records pending, has R TDC and LUA aVF    Na 135  K 5.3  BN 28  Cr 3.95   Assessment/ Plan: 1. Acute MI - sp PCI to RCA w/ stents x 2 2. ESRD - on HD MWF [facility]. HD tomorrow. No heparin.  3. Hyperkalemia - mild, be sure on renal diet. HD tomorrow, lokelma x 1 4. HTN/ vol - no vol excess on exam. LVEDP is a little high at 19. BP's soft. Try 1-2 L as tol w/ HD Monday.  5. Hypothyroidism 6. DM2 w/ polyradiculopathy - dx'd in  Feb , had bilat leg weakness, incontinence 7. HL      Rob Deniyah Dillavou  MD 10/18/2019, 9:39 PM  Recent Labs  Lab 10/18/19 1500  WBC 6.5  HGB 11.8*   Recent Labs  Lab 10/18/19 1500 10/18/19 1742  K 5.3*  --   BUN 28*  --   CREATININE 3.95* 3.92*  CALCIUM 9.3  --

## 2019-10-18 NOTE — ED Notes (Signed)
Chaplain and daughter at bedside.

## 2019-10-18 NOTE — ED Notes (Signed)
Cardiac zoll pads applied to back and chest. Pt remains a/ox4, resp e/u, nad.

## 2019-10-18 NOTE — ED Provider Notes (Signed)
[facility] EMERGENCY DEPARTMENT Provider Note   CSN: [id number] Arrival date & time: 10/18/19  1434     History Chief Complaint  Patient presents with   CP/STEMI   Emesis    Melanie Hall is a 49 y.o. female.  Pt presents to the ED today with CP.  Pt said it started last night.  She has had some nausea.  She is a dialysis patient (MWF) and has not skipped any doses.  No hx CAD.        Past Medical History:  Diagnosis Date   Anemia    Cataracts, bilateral    surgery to remove   Diabetes mellitus    type 2   ESRD on hemodialysis (H[facility])    GERD (gastroesophageal reflux disease)    Hyperlipidemia    Hypertension    Stroke ([place])    SVD (spontaneous vaginal delivery)    x 4    Patient Active Problem List   Diagnosis Date Noted   Loose stools    Acute cystitis without hematuria    Weakness    Weakness of both lower extremities    Debility 09/10/2019   ESRD (end stage renal disease) on dialysis ([place]) 09/10/2019   Depression 09/10/2019   Incontinence in female 09/10/2019   Pressure ulcer 09/10/2019   Anemia in chronic kidney disease 08/19/2019   Accelerated hypertension 08/08/2019   Right arm weakness 08/08/2019   MGUS (monoclonal gammopathy of unknown significance) 07/23/2019   Diarrhea    AKI (acute kidney injury) ([place]) 02/28/2019   Acute on chronic renal failure ([place]) 02/28/2019   GERD (gastroesophageal reflux disease) 02/28/2019   Closed fracture of left femur ([place]) 10/28/2018   Hypertension associated with diabetes ([place]) 10/28/2018   Sprain of left ankle    Emphysematous cystitis 08/26/2018   Vaginosis 08/26/2018   Hypokalemia 08/26/2018   Normocytic anemia 08/26/2018   Closed fracture of right ankle 11/06/2017   IRREGULAR MENSES 09/14/2009   BURN, FIRST DEGREE, ARM 09/14/2009   HYPERCHOLESTEROLEMIA 11/19/2008   PARONYCHIA, RIGHT GREAT TOE 07/30/2008   ABSCESS, TOOTH 03/31/2008   Type 2  diabetes with nephropathy ([place]) 07/02/2007   CATARACTS, BILATERAL 07/02/2007    Past Surgical History:  Procedure Laterality Date   AV FISTULA PLACEMENT Left 08/17/2019   Procedure: LEFT BRACHIAL CEPHALIC ARTERIOVENOUS (AV) FISTULA;  Surgeon: Angelia Mould, MD;  Location: [facility];  Service: Vascular;  Laterality: Left;   EYE SURGERY Bilateral    cataracts removed   FEMUR IM NAIL Left 10/28/2018   Procedure: RETROGRADE FEMORAL NAILING;  Surgeon: Meredith Pel, MD;  Location: [place];  Service: Orthopedics;  Laterality: Left;   IM NAILING FEMORAL SHAFT RETROGRADE Left 10/28/2018   IR FLUORO GUIDE CV LINE RIGHT  08/11/2019   IR US GUIDE VASC ACCESS RIGHT  08/11/2019   KNEE SURGERY Left    RADIOLOGY WITH ANESTHESIA N/A 09/15/2019   Procedure: [facility] AND LUMBER LOWER BACK PAIN;  Surgeon: Radiologist, Medication, MD;  Location: [place];  Service: Radiology;  Laterality: N/A;   TUBAL LIGATION       OB History    Gravida  4   Para  4   Term  4   Preterm      AB      Living        SAB      TAB      Ectopic      Multiple      Live Births  Family History  Problem Relation Age of Onset   Diabetes Mother    Hypertension Mother    Bipolar disorder Mother    Brain cancer Maternal Aunt    Heart disease Maternal Grandmother    Diabetes Maternal Grandmother    Breast cancer Maternal Aunt    Depression Father        Committed suicide   Diabetes Sister     Social History   Tobacco Use   Smoking status: Former Smoker    Packs/day: 0.25    Years: 2.00    Pack years: 0.50    Types: Cigarettes    Quit date: 1997    Years since quitting: 24.2   Smokeless tobacco: Never Used  Substance Use Topics   Alcohol use: No   Drug use: No    Home Medications Prior to Admission medications   Medication Sig Start Date End Date Taking? Authorizing Provider  amLODipine (NORVASC) 5 MG tablet Take 5 mg by mouth daily.    [provider]  carvedilol (COREG) 6.25 MG tablet Take 1 tablet (6.25 mg total) by mouth 2 (two) times daily with a meal. 09/19/19 11/18/19  Antonieta Pert, MD  epoetin alfa (EPOGEN) 3000 UNIT/ML injection Inject 3,000 Units into the skin every 14 (fourteen) days.  06/12/19   [provider]  gabapentin (NEURONTIN) 300 MG capsule Take 1 capsule (300 mg total) by mouth at bedtime. 09/19/19 10/19/19  Antonieta Pert, MD  levothyroxine (SYNTHROID) 25 MCG tablet Take 1 tablet (25 mcg total) by mouth daily. 08/25/19   Hoyt Koch, MD  lovastatin (MEVACOR) 20 MG tablet Take 40 mg by mouth at bedtime.    [provider]  lovastatin (MEVACOR) 40 MG tablet Take 1 tablet (40 mg total) by mouth at bedtime. Patient not taking: Reported on 09/30/2019 08/25/19   Hoyt Koch, MD  multivitamin (RENA-VIT) TABS tablet Take 1 tablet by mouth at bedtime. 08/25/19   Hoyt Koch, MD  pantoprazole (PROTONIX) 40 MG tablet Take 1 tablet (40 mg total) by mouth daily. 08/25/19 08/24/20  Hoyt Koch, MD  sertraline (ZOLOFT) 25 MG tablet Take 1 tablet (25 mg total) by mouth daily. 09/19/19 10/19/19  Antonieta Pert, MD  tamsulosin (FLOMAX) 0.4 MG CAPS capsule Take 1 capsule (0.4 mg total) by mouth daily after supper. 09/19/19 11/18/19  Antonieta Pert, MD  zolpidem (AMBIEN) 5 MG tablet Take 1 tablet (5 mg total) by mouth at bedtime as needed for sleep. 09/30/19   Soyla Dryer, PA-C    Allergies    Patient has no known allergies.  Review of Systems   Review of Systems  Cardiovascular: Positive for chest pain.  All other systems reviewed and are negative.   Physical Exam Updated Vital Signs BP 116/83    Pulse 78    Temp 97.8 F (36.6 C) (Oral)    Resp 15    Ht 5' 2.5" (1.588 m)    Wt 59.9 kg    LMP 12/17/2016    SpO2 100%    BMI 23.76 kg/m   Physical Exam Vitals and nursing note reviewed.  Constitutional:      Appearance: Normal appearance.  HENT:     Head: Normocephalic and  atraumatic.     Right Ear: External ear normal.     Left Ear: External ear normal.     Nose: Nose normal.     Mouth/Throat:     Mouth: Mucous membranes are moist.     Pharynx: Oropharynx  is clear.  Eyes:     Extraocular Movements: Extraocular movements intact.     Conjunctiva/sclera: Conjunctivae normal.     Pupils: Pupils are equal, round, and reactive to light.  Cardiovascular:     Rate and Rhythm: Normal rate and regular rhythm.     Pulses: Normal pulses.     Heart sounds: Normal heart sounds.  Pulmonary:     Effort: Pulmonary effort is normal.     Breath sounds: Normal breath sounds.  Abdominal:     General: Abdomen is flat. Bowel sounds are normal.     Palpations: Abdomen is soft.  Musculoskeletal:        General: Normal range of motion.     Cervical back: Normal range of motion and neck supple.  Skin:    General: Skin is warm.     Capillary Refill: Capillary refill takes less than 2 seconds.  Neurological:     General: No focal deficit present.     Mental Status: She is alert and oriented to person, place, and time.  Psychiatric:        Mood and Affect: Mood normal.        Behavior: Behavior normal.     ED Results / Procedures / Treatments   Labs (all labs ordered are listed, but only abnormal results are displayed) Labs Reviewed  RESPIRATORY PANEL BY RT PCR (FLU A&B, COVID)  BASIC METABOLIC PANEL  CBC  HEMOGLOBIN A1C  PROTIME-INR  APTT  LIPID PANEL  I-STAT BETA HCG BLOOD, ED ([facility], [facility], [facility] ONLY)  TROPONIN I (HIGH SENSITIVITY)    EKG EKG Interpretation  Date/Time:  Sunday October 18 2019 14:41:43 EDT Ventricular Rate:  76 PR Interval:  182 QRS Duration: 140 QT Interval:  392 QTC Calculation: 441 R Axis:   128 Text Interpretation: Normal sinus rhythm Non-specific intra-ventricular conduction block Anteroseptal infarct , possibly acute Inferolateral injury pattern Abnormal ECG inferiorlateral STEMI Confirmed by Isla Pence [phone]) on 10/18/2019 3:06:40  PM   Radiology No results found.  Procedures Procedures (including critical care time)  Medications Ordered in ED Medications  sodium chloride flush (NS) 0.9 % injection 3 mL (3 mLs Intravenous Not Given 10/18/19 1506)  aspirin chewable tablet 324 mg (324 mg Oral Given 10/18/19 1502)  heparin injection 3,600 Units (3,600 Units Intravenous Given 10/18/19 1502)    ED Course  I have reviewed the triage vital signs and the nursing notes.  Pertinent labs & imaging results that were available during my care of the patient were reviewed by me and considered in my medical decision making (see chart for details).    MDM Rules/Calculators/A&P                      Code STEMI called.  Pt given asa, heparin.  Pt went to the cath lab as soon as it was ready.  CRITICAL CARE Performed by: Isla Pence   Total critical care time: 30 minutes  Critical care time was exclusive of separately billable procedures and treating other patients.  Critical care was necessary to treat or prevent imminent or life-threatening deterioration.  Critical care was time spent personally by me on the following activities: development of treatment plan with patient and/or surrogate as well as nursing, discussions with consultants, evaluation of patient's response to treatment, examination of patient, obtaining history from patient or surrogate, ordering and performing treatments and interventions, ordering and review of laboratory studies, ordering and review of radiographic studies, pulse oximetry and re-evaluation  of patient's condition. Final Clinical Impression(s) / ED Diagnoses Final diagnoses:  ST elevation myocardial infarction (STEMI), unspecified artery ([place])  ESRD on hemodialysis [facility])    Rx / DC Orders ED Discharge Orders    None       Isla Pence, MD 10/18/19 1537

## 2019-10-18 NOTE — Progress Notes (Signed)
Femoral cath site was a level reported to oncoming nurse as a level 0; however, when the oncoming night shift nurse came in the room with day shift nurse it was noted that the site had transitioned to a level 1. The sight was actively oozing so the oncoming night shift nurse held pressure for 20 minutes; nurse reassessed after and still noticed slight oozing so held again for another 20 minutes. After second round of manual pressure beeding stopped. Estimated blood loss was small-moderate. Pressure was held from 7:40 to 8:20. Will continue to assess/monitor.

## 2019-10-18 NOTE — ED Notes (Signed)
Patient undressed and placed in gown.

## 2019-10-18 NOTE — ED Triage Notes (Signed)
Onset one day ago developed chest pain continued today 10/10 pressure achy with vomiting.

## 2019-10-18 NOTE — ED Notes (Signed)
s-ts less elevated  Daughter at  The bedside    Port chest new ekg  With s-t change

## 2019-10-18 NOTE — Progress Notes (Signed)
   10/18/19 1527  Clinical Encounter Type  Visited With Patient and family together;Health care provider  Visit Type Initial;ED   Chaplain responded to a code STEMI in the ED. Chaplain met with patient and patient's daughter, and they indicated no needs at this time. Chaplain extended hospitality. Chaplain introduced spiritual care services. Spiritual care services available as needed.   Jeri Lager, Chaplain

## 2019-10-18 NOTE — H&P (Signed)
Cardiology Admission History and Physical:   Patient ID: Melanie Hall MRN: [id number]; DOB: 25-Feb-1971   Admission date: 10/18/2019  Primary Care Provider: Soyla Dryer, PA-C Primary Cardiologist: No primary care provider on file.  Primary Electrophysiologist:  None   Chief Complaint:  Chest pain  Patient Profile:   Melanie Hall is a 49 y.o. female with history of end-stage renal disease, type 2 diabetes, hypertension, and mixed hyperlipidemia, presenting with an acute inferior STEMI  History of Present Illness:   Melanie Hall presents with chest pain and nausea since last night.  EKG in the emergency room demonstrates acute inferior posterior injury.  A code STEMI is called.  The patient is interviewed on arrival to the cardiac catheterization lab.  She still complains of 10/10 chest pain that is a pressure-like sensation in the center of the chest into the left upper arm.  There is associated nausea.  No shortness of breath, orthopnea, PND, or syncope.  She has not missed any recent dialysis sessions.  She is only been on hemodialysis for about 2 months.  She is currently dialyzing through temporary catheter and she has a left arm fistula that is not yet mature.  She has had diabetes for several years.  The patient ambulates with a walker.  She lives with her daughter who helps care for her.  The patient is a non-smoker.  There is a family history of coronary artery disease in her mother and grandmother.  No premature CAD in the family.   Past Medical History:  Diagnosis Date  . Anemia   . Cataracts, bilateral    surgery to remove  . Diabetes mellitus    type 2  . ESRD on hemodialysis ([place])   . GERD (gastroesophageal reflux disease)   . Hyperlipidemia   . Hypertension   . Stroke ([place])   . SVD (spontaneous vaginal delivery)    x 4    Past Surgical History:  Procedure Laterality Date  . AV FISTULA PLACEMENT Left 08/17/2019   Procedure: LEFT BRACHIAL CEPHALIC ARTERIOVENOUS  (AV) FISTULA;  Surgeon: Angelia Mould, MD;  Location: [facility];  Service: Vascular;  Laterality: Left;  . EYE SURGERY Bilateral    cataracts removed  . FEMUR IM NAIL Left 10/28/2018   Procedure: RETROGRADE FEMORAL NAILING;  Surgeon: Meredith Pel, MD;  Location: [facility];  Service: Orthopedics;  Laterality: Left;  . IM NAILING FEMORAL SHAFT RETROGRADE Left 10/28/2018  . IR FLUORO GUIDE CV LINE RIGHT  08/11/2019  . IR US GUIDE VASC ACCESS RIGHT  08/11/2019  . KNEE SURGERY Left   . RADIOLOGY WITH ANESTHESIA N/A 09/15/2019   Procedure: [facility] AND LUMBER LOWER BACK PAIN;  Surgeon: Radiologist, Medication, MD;  Location: [facility];  Service: Radiology;  Laterality: N/A;  . TUBAL LIGATION       Medications Prior to Admission: Prior to Admission medications   Medication Sig Start Date End Date Taking? Authorizing Provider  amLODipine (NORVASC) 5 MG tablet Take 5 mg by mouth daily.    [provider]  carvedilol (COREG) 6.25 MG tablet Take 1 tablet (6.25 mg total) by mouth 2 (two) times daily with a meal. 09/19/19 11/18/19  Antonieta Pert, MD  epoetin alfa (EPOGEN) 3000 UNIT/ML injection Inject 3,000 Units into the skin every 14 (fourteen) days.  06/12/19   [provider]  gabapentin (NEURONTIN) 300 MG capsule Take 1 capsule (300 mg total) by mouth at bedtime. 09/19/19 10/19/19  Antonieta Pert, MD  levothyroxine (SYNTHROID) 25  MCG tablet Take 1 tablet (25 mcg total) by mouth daily. 08/25/19   Hoyt Koch, MD  lovastatin (MEVACOR) 20 MG tablet Take 40 mg by mouth at bedtime.    [provider]  lovastatin (MEVACOR) 40 MG tablet Take 1 tablet (40 mg total) by mouth at bedtime. Patient not taking: Reported on 09/30/2019 08/25/19   Hoyt Koch, MD  multivitamin (RENA-VIT) TABS tablet Take 1 tablet by mouth at bedtime. 08/25/19   Hoyt Koch, MD  pantoprazole (PROTONIX) 40 MG tablet Take 1 tablet (40 mg total) by mouth daily. 08/25/19 08/24/20  Hoyt Koch, MD  sertraline (ZOLOFT) 25 MG tablet Take 1 tablet (25 mg total) by mouth daily. 09/19/19 10/19/19  Antonieta Pert, MD  tamsulosin (FLOMAX) 0.4 MG CAPS capsule Take 1 capsule (0.4 mg total) by mouth daily after supper. 09/19/19 11/18/19  Antonieta Pert, MD  zolpidem (AMBIEN) 5 MG tablet Take 1 tablet (5 mg total) by mouth at bedtime as needed for sleep. 09/30/19   Soyla Dryer, PA-C     Allergies:   No Known Allergies  Social History:   Social History   Socioeconomic History  . Marital status: Widowed    Spouse name: Not on file  . Number of children: 4  . Years of education: Not on file  . Highest education level: Not on file  Occupational History  . Not on file  Tobacco Use  . Smoking status: Former Smoker    Packs/day: 0.25    Years: 2.00    Pack years: 0.50    Types: Cigarettes    Quit date: 1997    Years since quitting: 24.2  . Smokeless tobacco: Never Used  Substance and Sexual Activity  . Alcohol use: No  . Drug use: No  . Sexual activity: Yes    Birth control/protection: Surgical  Other Topics Concern  . Not on file  Social History Narrative  . Not on file   Social Determinants of Health   Financial Resource Strain:   . Difficulty of Paying Living Expenses:   Food Insecurity:   . Worried About Charity fundraiser in the Last Year:   . Arboriculturist in the Last Year:   Transportation Needs:   . Film/video editor (Medical):   Marland Kitchen Lack of Transportation (Non-Medical):   Physical Activity:   . Days of Exercise per Week:   . Minutes of Exercise per Session:   Stress:   . Feeling of Stress :   Social Connections:   . Frequency of Communication with Friends and Family:   . Frequency of Social Gatherings with Friends and Family:   . Attends Religious Services:   . Active Member of Clubs or Organizations:   . Attends Archivist Meetings:   Marland Kitchen Marital Status:   Intimate Partner Violence:   . Fear of Current or Ex-Partner:   . Emotionally Abused:     Marland Kitchen Physically Abused:   . Sexually Abused:     Family History:   The patient's family history includes Bipolar disorder in her mother; Brain cancer in her maternal aunt; Breast cancer in her maternal aunt; Depression in her father; Diabetes in her maternal grandmother, mother, and sister; Heart disease in her maternal grandmother; Hypertension in her mother.    ROS:  Please see the history of present illness.  All other ROS reviewed and negative.     Physical Exam/Data:   Vitals:   10/18/19 1440 10/18/19 1445  10/18/19 1500 10/18/19 1515  BP: 97/83  116/83 114/78  Pulse: 74  78 76  Resp: 14  15 (!) 8  Temp: 97.8 F (36.6 C)     TempSrc: Oral     SpO2: 98%  100% 96%  Weight: 59.9 kg 59.9 kg    Height: 5' 2.5" (1.588 m) 5' 2.5" (1.588 m)     No intake or output data in the 24 hours ending 10/18/19 1536 Last 3 Weights 10/18/2019 10/18/2019 09/18/2019  Weight (lbs) 132 lb 132 lb 154 lb 1.6 oz  Weight (kg) 59.875 kg 59.875 kg 69.9 kg     Body mass index is 23.76 kg/m.  General: Chronically ill-appearing, in no acute distress HEENT: normal Lymph: no adenopathy Neck: no JVD Endocrine:  No thryomegaly Vascular: No carotid bruits; FA pulses 2+ bilaterally  Cardiac:  normal S1, S2; RRR; no murmur  Lungs:  clear to auscultation bilaterally, no wheezing, rhonchi or rales  Abd: soft, nontender, no hepatomegaly  Ext: no edema Musculoskeletal:  No deformities, BUE and BLE strength normal and equal Skin: warm and dry  Neuro:  CNs 2-12 intact, no focal abnormalities noted Psych:  Normal affect    EKG:  The ECG that was done today was personally reviewed and demonstrates normal sinus rhythm, acute inferoposterior injury  Relevant CV Studies: Pending  Laboratory Data:  High Sensitivity Troponin:  No results for input(s): TROPONINIHS in the last 720 hours.    ChemistryNo results for input(s): NA, K, CL, CO2, GLUCOSE, BUN, CREATININE, CALCIUM, GFRNONAA, GFRAA, ANIONGAP in the last 168  hours.  No results for input(s): PROT, ALBUMIN, AST, ALT, ALKPHOS, BILITOT in the last 168 hours. Hematology Recent Labs  Lab 10/18/19 1500  WBC 6.5  RBC 3.84*  HGB 11.8*  HCT 38.4  MCV 100.0  MCH 30.7  MCHC 30.7  RDW 14.7  PLT 185   BNPNo results for input(s): BNP, PROBNP in the last 168 hours.  DDimer No results for input(s): DDIMER in the last 168 hours.   Radiology/Studies:  No results found.  TIMI Risk Score for ST  Elevation MI:   The patient's TIMI risk score is 3, which indicates a 4.4% risk of all cause mortality at 30 days.    Assessment and Plan:   1. Acute inferoposterior STEMI 2. End-stage renal disease 3. Type 2 diabetes, insulin requiring 4. Hypertension  Heparin 60 units/kg was administered in the emergency department.  Aspirin 324 mg is administered.  Plan to proceed with emergency cardiac catheterization and primary PCI via femoral artery access with ultrasound guidance.  Further plans/disposition pending her cardiac catheterization findings and treatment.  Guideline directed medical therapy will be initiated. We will consult nephrology for management of her dialysis.  Will cover diabetes with sliding scale insulin.  Severity of Illness: The appropriate patient status for this patient is INPATIENT. Inpatient status is judged to be reasonable and necessary in order to provide the required intensity of service to ensure the patient's safety. The patient's presenting symptoms, physical exam findings, and initial radiographic and laboratory data in the context of their chronic comorbidities is felt to place them at high risk for further clinical deterioration. Furthermore, it is not anticipated that the patient will be medically stable for discharge from the hospital within 2 midnights of admission.   * I certify that at the point of admission it is my clinical judgment that the patient will require inpatient hospital care spanning beyond 2 midnights from the  point of admission  due to high intensity of service, high risk for further deterioration and high frequency of surveillance required.*   For questions or updates, please contact [place] Please consult [email or web address] for contact info under   Signed, Sherren Mocha, MD  10/18/2019 3:36 PM

## 2019-10-19 ENCOUNTER — Inpatient Hospital Stay (HOSPITAL_COMMUNITY): Payer: Medicaid Other

## 2019-10-19 DIAGNOSIS — I340 Nonrheumatic mitral (valve) insufficiency: Secondary | ICD-10-CM

## 2019-10-19 DIAGNOSIS — I361 Nonrheumatic tricuspid (valve) insufficiency: Secondary | ICD-10-CM

## 2019-10-19 LAB — CBC
HCT: 31.6 % — ABNORMAL LOW (ref 36.0–46.0)
Hemoglobin: 10.1 g/dL — ABNORMAL LOW (ref 12.0–15.0)
MCH: 31.3 pg (ref 26.0–34.0)
MCHC: 32 g/dL (ref 30.0–36.0)
MCV: 97.8 fL (ref 80.0–100.0)
Platelets: 153 10*3/uL (ref 150–400)
RBC: 3.23 MIL/uL — ABNORMAL LOW (ref 3.87–5.11)
RDW: 14.9 % (ref 11.5–15.5)
WBC: 6.7 10*3/uL (ref 4.0–10.5)
nRBC: 0 % (ref 0.0–0.2)

## 2019-10-19 LAB — LIPID PANEL
Cholesterol: 95 mg/dL (ref 0–200)
HDL: 28 mg/dL — ABNORMAL LOW (ref >40)
LDL Cholesterol: 40 mg/dL (ref 0–99)
Total CHOL/HDL Ratio: 3.4 RATIO
Triglycerides: 136 mg/dL (ref <150)
VLDL: 27 mg/dL (ref 0–40)

## 2019-10-19 LAB — RENAL FUNCTION PANEL
Albumin: 2.5 g/dL — ABNORMAL LOW (ref 3.5–5.0)
Anion gap: 13 (ref 5–15)
BUN: 31 mg/dL — ABNORMAL HIGH (ref 6–20)
CO2: 21 mmol/L — ABNORMAL LOW (ref 22–32)
Calcium: 8.6 mg/dL — ABNORMAL LOW (ref 8.9–10.3)
Chloride: 99 mmol/L (ref 98–111)
Creatinine, Ser: 4.75 mg/dL — ABNORMAL HIGH (ref 0.44–1.00)
GFR calc Af Amer: 12 mL/min — ABNORMAL LOW (ref >60)
GFR calc non Af Amer: 10 mL/min — ABNORMAL LOW (ref >60)
Glucose, Bld: 222 mg/dL — ABNORMAL HIGH (ref 70–99)
Phosphorus: 5.2 mg/dL — ABNORMAL HIGH (ref 2.5–4.6)
Potassium: 5.4 mmol/L — ABNORMAL HIGH (ref 3.5–5.1)
Sodium: 133 mmol/L — ABNORMAL LOW (ref 135–145)

## 2019-10-19 LAB — GLUCOSE, CAPILLARY
Glucose-Capillary: 142 mg/dL — ABNORMAL HIGH (ref 70–99)
Glucose-Capillary: 151 mg/dL — ABNORMAL HIGH (ref 70–99)
Glucose-Capillary: 160 mg/dL — ABNORMAL HIGH (ref 70–99)
Glucose-Capillary: 181 mg/dL — ABNORMAL HIGH (ref 70–99)
Glucose-Capillary: 184 mg/dL — ABNORMAL HIGH (ref 70–99)
Glucose-Capillary: 206 mg/dL — ABNORMAL HIGH (ref 70–99)
Glucose-Capillary: 235 mg/dL — ABNORMAL HIGH (ref 70–99)

## 2019-10-19 LAB — POCT ACTIVATED CLOTTING TIME: Activated Clotting Time: 263 seconds

## 2019-10-19 LAB — ECHOCARDIOGRAM LIMITED
Height: 62.5 in
Weight: 2370.39 oz

## 2019-10-19 LAB — MRSA PCR SCREENING: MRSA by PCR: NEGATIVE

## 2019-10-19 MED ORDER — SODIUM CHLORIDE 0.9% FLUSH
10.0000 mL | Freq: Two times a day (BID) | INTRAVENOUS | Status: DC
  Administered 2019-10-22: 10 mL

## 2019-10-19 MED ORDER — HEPARIN SODIUM (PORCINE) 1000 UNIT/ML IJ SOLN
INTRAMUSCULAR | Status: AC
  Filled 2019-10-19: qty 4

## 2019-10-19 MED ORDER — INSULIN ASPART PROT & ASPART (70-30 MIX) 100 UNIT/ML ~~LOC~~ SUSP
10.0000 [IU] | Freq: Two times a day (BID) | SUBCUTANEOUS | Status: DC
  Administered 2019-10-19 – 2019-10-22 (×5): 10 [IU] via SUBCUTANEOUS
  Filled 2019-10-19: qty 10

## 2019-10-19 MED ORDER — SODIUM CHLORIDE 0.9% FLUSH: 10.0000 mL | INTRAVENOUS | Status: DC | PRN

## 2019-10-19 NOTE — Hospital Course (Signed)
Progress Note  Patient Name: MODEST DRAEGER Date of Encounter: 10/19/2019  Primary Cardiologist: Dr. Sherren Mocha   Subjective   Postop day #1 inferior STEMI treated with PCI drug-eluting stenting x2 to the distal RCA with moderately severe LV dysfunction.  She is on optimal medical therapy.  She currently is receiving hemodialysis.  Inpatient Medications    Scheduled Meds:  aspirin EC  81 mg Oral Daily   atorvastatin  80 mg Oral q1800   carvedilol  6.25 mg Oral BID WC   Chlorhexidine Gluconate Cloth  6 each Topical Q0600   epoetin alfa  3,000 Units Subcutaneous Once   gabapentin  300 mg Oral QHS   heparin       heparin  5,000 Units Subcutaneous Q8H   insulin aspart  0-5 Units Subcutaneous QHS   insulin aspart  0-6 Units Subcutaneous TID WC   levothyroxine  25 mcg Oral Daily   multivitamin  1 tablet Oral QHS   pantoprazole  40 mg Oral Daily   sertraline  25 mg Oral Daily   [START ON 10/20/2019] sodium chloride flush  10-40 mL Intracatheter Q12H   sodium chloride flush  3 mL Intravenous Once   sodium chloride flush  3 mL Intravenous Q12H   ticagrelor  90 mg Oral BID   Continuous Infusions:  sodium chloride 10 mL/hr at 10/19/19 0700   PRN Meds: sodium chloride, acetaminophen, alteplase, heparin, heparin, heparin, heparin, nitroGLYCERIN, ondansetron (ZOFRAN) IV, [START ON 10/20/2019] sodium chloride flush, sodium chloride flush, zolpidem   Vital Signs    Vitals:   10/19/19 0815 10/19/19 0830 10/19/19 0845 10/19/19 0900  BP: 119/85 117/83 124/81 123/86  Pulse: 83 86 86 88  Resp:      Temp:      TempSrc:      SpO2:      Weight:      Height:        Intake/Output Summary (Last 24 hours) at 10/19/2019 0907 Last data filed at 10/19/2019 0700 Gross per 24 hour  Intake 119.4 ml  Output 100 ml  Net 19.4 ml   Last 3 Weights 10/19/2019 10/19/2019 10/18/2019  Weight (lbs) 148 lb 2.4 oz 148 lb 13 oz 132 lb  Weight (kg) 67.2 kg 67.5 kg 59.875 kg      Telemetry      Normal sinus rhythm with nonsustained ventricular tachycardia- Personally Reviewed  ECG    Normal sinus rhythm at 80 with small inferior Q waves and mild inferolateral T wave inversion- Personally Reviewed  Physical Exam   GEN: No acute distress.   Neck: No JVD Cardiac: RRR, no murmurs, rubs, or gallops.  Respiratory: Clear to auscultation bilaterally. GI: Soft, nontender, non-distended  MS: No edema; No deformity. Neuro:  Nonfocal  Psych: Normal affect  Extremities-groin okay  Labs    High Sensitivity Troponin:   Recent Labs  Lab 10/18/19 1500 10/18/19 1742  TROPONINIHS 948* 7,031*      Chemistry Recent Labs  Lab 10/18/19 1500 10/18/19 1500 10/18/19 1742 10/18/19 2032 10/19/19 0034  NA 135  --   --  132* 133*  K 5.3*  --   --  5.2* 5.4*  CL 98  --   --  98 99  CO2 23  --   --  20* 21*  GLUCOSE 257*  --   --  197* 222*  BUN 28*  --   --  30* 31*  CREATININE 3.95*   < > 3.92* 4.03* 4.75*  CALCIUM 9.3  --   --  8.7* 8.6*  ALBUMIN  --   --   --  2.7* 2.5*  GFRNONAA 13*   < > 13* 12* 10*  GFRAA 15*   < > 15* 14* 12*  ANIONGAP 14  --   --  14 13   < > = values in this interval not displayed.     Hematology Recent Labs  Lab 10/18/19 1500 10/18/19 2032 10/19/19 0034  WBC 6.5 5.7 6.7  RBC 3.84* 3.35* 3.23*  HGB 11.8* 10.3* 10.1*  HCT 38.4 33.6* 31.6*  MCV 100.0 100.3* 97.8  MCH 30.7 30.7 31.3  MCHC 30.7 30.7 32.0  RDW 14.7 14.8 14.9  PLT 185 140* 153    BNPNo results for input(s): BNP, PROBNP in the last 168 hours.   DDimer No results for input(s): DDIMER in the last 168 hours.   Radiology    CARDIAC CATHETERIZATION  Result Date: 10/18/2019  Dist RCA lesion is 95% stenosed.  Mid RCA lesion is 50% stenosed.  A drug-eluting stent was successfully placed using a STENT RESOLUTE ONYX 3.5X18.  Post intervention, there is a 0% residual stenosis.  A drug-eluting stent was successfully placed using a STENT RESOLUTE ONYX 3.5X18.  Post intervention,  there is a 0% residual stenosis.  There is moderate to severe left ventricular systolic dysfunction.  LV end diastolic pressure is mildly elevated.  There is moderate (3+) mitral regurgitation.  1.  Severe single-vessel coronary artery disease with critical stenosis of the distal RCA, treated successfully with overlapping drug-eluting stents using intravascular ultrasound guidance (3.5 x 18 mm resolute Onyx DES x2) 2.  Patent LAD, diagonal branches, left main, and small left circumflex without significant stenoses 3.  Dense inferior infarct involving the entire inferior wall and inferoapex, LVEF estimated at 35% 4.  Moderate mitral regurgitation by left ventriculography 5.  Post PCI no reflow, treated with verapamil with modest improvement in flow. Recommendations: Guideline directed medical therapy.  Aspirin and ticagrelor at least 12 months.  Echocardiogram to assess LV function and mitral regurgitation.  Will contact nephrology for dialysis management.   DG Chest Portable 1 View  Result Date: 10/18/2019 CLINICAL DATA:  49 year old female with left-sided chest pain. EXAM: PORTABLE CHEST 1 VIEW COMPARISON:  Chest radiograph dated 09/09/2019. FINDINGS: Dialysis catheter in similar position. Moderate right and small left pleural effusion with associated compressive atelectasis of the lung bases. Superimposed pneumonia is not excluded. Overall significant interval improvement in the aeration of the lungs since the prior radiograph with resolution of the previously seen vascular congestion and edema. No pneumothorax. Stable cardiomegaly. No acute osseous pathology. IMPRESSION: 1. Resolution of the previously seen vascular congestion and edema. 2. Bilateral, right greater than left, pleural effusions with associated compressive atelectasis of the lung bases. Electronically Signed   By: Anner Crete M.D.   On: 10/18/2019 16:00    Cardiac Studies   Cardiac catheterization/PCI and stent  (10/18/2019)  Conclusion    Dist RCA lesion is 95% stenosed. Mid RCA lesion is 50% stenosed. A drug-eluting stent was successfully placed using a STENT RESOLUTE ONYX 3.5X18. Post intervention, there is a 0% residual stenosis. A drug-eluting stent was successfully placed using a STENT RESOLUTE ONYX 3.5X18. Post intervention, there is a 0% residual stenosis. There is moderate to severe left ventricular systolic dysfunction. LV end diastolic pressure is mildly elevated. There is moderate (3+) mitral regurgitation.   1.  Severe single-vessel coronary artery disease with critical stenosis of the distal  RCA, treated successfully with overlapping drug-eluting stents using intravascular ultrasound guidance (3.5 x 18 mm resolute Onyx DES x2) 2.  Patent LAD, diagonal branches, left main, and small left circumflex without significant stenoses 3.  Dense inferior infarct involving the entire inferior wall and inferoapex, LVEF estimated at 35% 4.  Moderate mitral regurgitation by left ventriculography 5.  Post PCI no reflow, treated with verapamil with modest improvement in flow.   Recommendations: Guideline directed medical therapy.  Aspirin and ticagrelor at least 12 months.  Echocardiogram to assess LV function and mitral regurgitation.  Will contact nephrology for dialysis management.   Coronary Diagrams  Diagnostic Dominance: Right  Intervention   Implants     Patient Profile     49 y.o. female history of diabetes, hypertension, and hyperlipidemia on hemodialysis for the last 2 months.  She presented with inferior STEMI yesterday afternoon and underwent PCI and drug-eluting stenting x2 by Dr. Burt Knack to the distal RCA using ultrasound guidance.  She had a good angiographic result with some mild reflow requiring intra-coronary verapamil.  Her troponins rose to 7000.  She is currently pain-free, hemodynamically stable on optimal medical therapy including dual antiplatelet therapy with aspirin  and Brilinta.  Assessment & Plan    1: Coronary artery disease-postop day 1 inferior STEMI treated PCI drug-eluting stenting x2 of the distal RCA by Dr. Burt Knack.  She had otherwise no significant CAD.  She is on dual antiplatelet therapy including aspirin and Brilinta.  She has had no recurrent chest pain.  Her groin is stable.  2: Ischemic card myopathy-EF 35% with moderate mitral gravitation on left ventriculography.  Her EF week ago was normal.  Her troponins rose to 7000.  I suspect some of this is stunned and that she will get recovery of LV function with optimal medical therapy.  Repeat 2D echo is pending.  She is on carvedilol 6.25 mg p.o. twice daily which we can titrate depending on her blood pressure.  She may benefit from being on Entresto which would not be an issue with her chronic renal sufficiency and hemodialysis  3: Hyperlipidemia-on high-dose statin therapy  4: Insulin-dependent diabetes-her 70/30 insulin was restarted and she is on sliding scale  5: Chronic renal sufficiency-on hemodialysis  We will keep her in [facility] today for observation and plan on transferring her to telemetry bed tomorrow if she remains clinically stable we will get a cardiac rehab consult for ambulation with anticipation of discharged home on Wednesday after hemodialysis.  For questions or updates, please contact [place] Please consult [email or web address] for contact info under        Signed, Quay Burow, MD  10/19/2019, 9:07 AM

## 2019-10-19 NOTE — Progress Notes (Signed)
Progress Note  Patient Name: Melanie Hall Date of Encounter: 10/19/2019  Primary Cardiologist: Dr. Sherren Mocha  Subjective   Postop day #1 inferior STEMI treated with PCI and drug-eluting stenting of the distal RCA  Inpatient Medications    Scheduled Meds: . aspirin EC  81 mg Oral Daily  . atorvastatin  80 mg Oral q1800  . carvedilol  6.25 mg Oral BID WC  . Chlorhexidine Gluconate Cloth  6 each Topical Q0600  . epoetin alfa  3,000 Units Subcutaneous Once  . gabapentin  300 mg Oral QHS  . heparin      . heparin  5,000 Units Subcutaneous Q8H  . insulin aspart  0-5 Units Subcutaneous QHS  . insulin aspart  0-6 Units Subcutaneous TID WC  . insulin aspart protamine- aspart  10 Units Subcutaneous BID WC  . levothyroxine  25 mcg Oral Daily  . multivitamin  1 tablet Oral QHS  . pantoprazole  40 mg Oral Daily  . sertraline  25 mg Oral Daily  . [START ON 10/20/2019] sodium chloride flush  10-40 mL Intracatheter Q12H  . sodium chloride flush  3 mL Intravenous Once  . sodium chloride flush  3 mL Intravenous Q12H  . ticagrelor  90 mg Oral BID   Continuous Infusions: . sodium chloride 10 mL/hr at 10/19/19 0700   PRN Meds: sodium chloride, acetaminophen, alteplase, heparin, heparin, heparin, heparin, nitroGLYCERIN, ondansetron (ZOFRAN) IV, [START ON 10/20/2019] sodium chloride flush, sodium chloride flush, zolpidem   Vital Signs    Vitals:   10/19/19 0830 10/19/19 0845 10/19/19 0900 10/19/19 0915  BP: 117/83 124/81 123/86 120/78  Pulse: 86 86 88 88  Resp:      Temp:      TempSrc:      SpO2:      Weight:      Height:        Intake/Output Summary (Last 24 hours) at 10/19/2019 0918 Last data filed at 10/19/2019 0700 Gross per 24 hour  Intake 119.4 ml  Output 100 ml  Net 19.4 ml   Last 3 Weights 10/19/2019 10/19/2019 10/18/2019  Weight (lbs) 148 lb 2.4 oz 148 lb 13 oz 132 lb  Weight (kg) 67.2 kg 67.5 kg 59.875 kg      Telemetry    Sinus rhythm with nonsustained  ventricular tachycardia- Personally Reviewed  ECG    Normal sinus rhythm at 80 with small inferior Q waves and inferolateral T wave inversion- Personally Reviewed  Physical Exam   GEN: No acute distress.   Neck: No JVD Cardiac: RRR, no murmurs, rubs, or gallops.  Respiratory: Clear to auscultation bilaterally. GI: Soft, nontender, non-distended  MS: No edema; No deformity. Neuro:  Nonfocal  Psych: Normal affect  Extremities-groin okay  Labs    High Sensitivity Troponin:   Recent Labs  Lab 10/18/19 1500 10/18/19 1742  TROPONINIHS 948* 7,031*      Chemistry Recent Labs  Lab 10/18/19 1500 10/18/19 1500 10/18/19 1742 10/18/19 2032 10/19/19 0034  NA 135  --   --  132* 133*  K 5.3*  --   --  5.2* 5.4*  CL 98  --   --  98 99  CO2 23  --   --  20* 21*  GLUCOSE 257*  --   --  197* 222*  BUN 28*  --   --  30* 31*  CREATININE 3.95*   < > 3.92* 4.03* 4.75*  CALCIUM 9.3  --   --  8.7* 8.6*  ALBUMIN  --   --   --  2.7* 2.5*  GFRNONAA 13*   < > 13* 12* 10*  GFRAA 15*   < > 15* 14* 12*  ANIONGAP 14  --   --  14 13   < > = values in this interval not displayed.     Hematology Recent Labs  Lab 10/18/19 1500 10/18/19 2032 10/19/19 0034  WBC 6.5 5.7 6.7  RBC 3.84* 3.35* 3.23*  HGB 11.8* 10.3* 10.1*  HCT 38.4 33.6* 31.6*  MCV 100.0 100.3* 97.8  MCH 30.7 30.7 31.3  MCHC 30.7 30.7 32.0  RDW 14.7 14.8 14.9  PLT 185 140* 153    BNPNo results for input(s): BNP, PROBNP in the last 168 hours.   DDimer No results for input(s): DDIMER in the last 168 hours.   Radiology    CARDIAC CATHETERIZATION  Result Date: 10/18/2019  Dist RCA lesion is 95% stenosed.  Mid RCA lesion is 50% stenosed.  A drug-eluting stent was successfully placed using a STENT RESOLUTE ONYX 3.5X18.  Post intervention, there is a 0% residual stenosis.  A drug-eluting stent was successfully placed using a STENT RESOLUTE ONYX 3.5X18.  Post intervention, there is a 0% residual stenosis.  There is  moderate to severe left ventricular systolic dysfunction.  LV end diastolic pressure is mildly elevated.  There is moderate (3+) mitral regurgitation.  1.  Severe single-vessel coronary artery disease with critical stenosis of the distal RCA, treated successfully with overlapping drug-eluting stents using intravascular ultrasound guidance (3.5 x 18 mm resolute Onyx DES x2) 2.  Patent LAD, diagonal branches, left main, and small left circumflex without significant stenoses 3.  Dense inferior infarct involving the entire inferior wall and inferoapex, LVEF estimated at 35% 4.  Moderate mitral regurgitation by left ventriculography 5.  Post PCI no reflow, treated with verapamil with modest improvement in flow. Recommendations: Guideline directed medical therapy.  Aspirin and ticagrelor at least 12 months.  Echocardiogram to assess LV function and mitral regurgitation.  Will contact nephrology for dialysis management.   DG Chest Portable 1 View  Result Date: 10/18/2019 CLINICAL DATA:  49 year old female with left-sided chest pain. EXAM: PORTABLE CHEST 1 VIEW COMPARISON:  Chest radiograph dated 09/09/2019. FINDINGS: Dialysis catheter in similar position. Moderate right and small left pleural effusion with associated compressive atelectasis of the lung bases. Superimposed pneumonia is not excluded. Overall significant interval improvement in the aeration of the lungs since the prior radiograph with resolution of the previously seen vascular congestion and edema. No pneumothorax. Stable cardiomegaly. No acute osseous pathology. IMPRESSION: 1. Resolution of the previously seen vascular congestion and edema. 2. Bilateral, right greater than left, pleural effusions with associated compressive atelectasis of the lung bases. Electronically Signed   By: Anner Crete M.D.   On: 10/18/2019 16:00     Cardiac catheterization/PCI and stent (10/18/2019)  Conclusion     Dist RCA lesion is 95% stenosed.  Mid RCA  lesion is 50% stenosed.  A drug-eluting stent was successfully placed using a STENT RESOLUTE ONYX 3.5X18.  Post intervention, there is a 0% residual stenosis.  A drug-eluting stent was successfully placed using a STENT RESOLUTE ONYX 3.5X18.  Post intervention, there is a 0% residual stenosis.  There is moderate to severe left ventricular systolic dysfunction.  LV end diastolic pressure is mildly elevated.  There is moderate (3+) mitral regurgitation.   1.  Severe single-vessel coronary artery disease with critical stenosis of the distal RCA, treated successfully with overlapping  drug-eluting stents using intravascular ultrasound guidance (3.5 x 18 mm resolute Onyx DES x2) 2.  Patent LAD, diagonal branches, left main, and small left circumflex without significant stenoses 3.  Dense inferior infarct involving the entire inferior wall and inferoapex, LVEF estimated at 35% 4.  Moderate mitral regurgitation by left ventriculography 5.  Post PCI no reflow, treated with verapamil with modest improvement in flow.   Recommendations: Guideline directed medical therapy.  Aspirin and ticagrelor at least 12 months.  Echocardiogram to assess LV function and mitral regurgitation.  Will contact nephrology for dialysis management.   Coronary Diagrams  Diagnostic Dominance: Right  Intervention   Implants     Patient Profile     49 y.o. female history of diabetes, hypertension, and hyperlipidemia on hemodialysis for the last 2 months.  She presented with inferior STEMI yesterday afternoon and underwent PCI and drug-eluting stenting x2 by Dr. Burt Knack to the distal RCA using ultrasound guidance.  She had a good angiographic result with some mild reflow requiring intra-coronary verapamil.  Her troponins rose to 7000.  She is currently pain-free, hemodynamically stable on optimal medical therapy including dual antiplatelet therapy with aspirin and Brilinta.  Assessment & Plan    1: Coronary artery  disease-postop day 1 inferior STEMI treated PCI drug-eluting stenting x2 of the distal RCA by Dr. Burt Knack.  She had otherwise no significant CAD.  She is on dual antiplatelet therapy including aspirin and Brilinta.  She has had no recurrent chest pain.  Her groin is stable.  2: Ischemic card myopathy-EF 35% with moderate mitral gravitation on left ventriculography.  Her EF week ago was normal.  Her troponins rose to 7000.  I suspect some of this is stunned and that she will get recovery of LV function with optimal medical therapy.  Repeat 2D echo is pending.  She is on carvedilol 6.25 mg p.o. twice daily which we can titrate depending on her blood pressure.  She may benefit from being on Entresto which would not be an issue with her chronic renal sufficiency and hemodialysis  3: Hyperlipidemia-on high-dose statin therapy  4: Insulin-dependent diabetes-her 70/30 insulin was restarted and she is on sliding scale  5: Chronic renal sufficiency-on hemodialysis  We will keep her in [place] today for observation and plan on transferring her to telemetry bed tomorrow if she remains clinically stable we will get a cardiac rehab consult for ambulation with anticipation of discharged home on Wednesday after hemodialysis.  For questions or updates, please contact [place] Please consult [email or web address] for contact info under        Signed, Quay Burow, MD  10/19/2019, 9:07 AM

## 2019-10-19 NOTE — Progress Notes (Signed)
Patient ID: Melanie Hall, female   DOB: March 07, 1971, 49 y.o.   MRN: [id number] [facility] Progress Note   Assessment/ Plan:   1.  Acute ST elevation myocardial infarction: Status post emergent coronary catheterization with PCI (stents) to distal RCA.  She is on aspirin, Lipitor and Brilinta. 2. ESRD: Usually on a Monday/Wednesday/Friday dialysis schedule and undergoing hemodialysis at this time.  Hyperkalemia should be corrected with current dialysis prescription. 3. Anemia: Without significant/overt loss noted status post coronary angiography, will continue to monitor H/H trend. 4. CKD-MBD: Calcium and phosphorus level currently within acceptable range, continue to monitor on current renal diet/hemodialysis. 5. Nutrition: Continue surveillance of albumin levels on renal diet/binders. 6. Hypertension: Blood pressure currently under acceptable control, efforts at volume unloading based on LVEDP.  Subjective:   Reports to be feeling better this morning with no significant chest pain or shortness of breath overnight.   Objective:   BP 126/80   Pulse 79   Temp 98.3 F (36.8 C) (Oral)   Resp (!) 21   Ht 5' 2.5" (1.588 m)   Wt 67.5 kg   LMP 12/17/2016   SpO2 95%   BMI 26.78 kg/m   Physical Exam: Gen: Comfortably resting in bed CVS: Pulse regular rhythm, normal rate, S1 and S2 normal Resp: Anteriorly clear to auscultation bilaterally.  No rales or rhonchi.  Right IJ TDC. Abd: Soft, flat, nontender, bowel sounds normal Ext: No lower extremity edema.  Left brachiocephalic fistula with excellent thrill.  Labs: BMET Recent Labs  Lab 10/18/19 1500 10/18/19 1742 10/18/19 2032 10/19/19 0034  NA 135  --  132* 133*  K 5.3*  --  5.2* 5.4*  CL 98  --  98 99  CO2 23  --  20* 21*  GLUCOSE 257*  --  197* 222*  BUN 28*  --  30* 31*  CREATININE 3.95* 3.92* 4.03* 4.75*  CALCIUM 9.3  --  8.7* 8.6*  PHOS  --   --  5.0* 5.2*   CBC Recent Labs  Lab 10/18/19 1500  10/18/19 2032 10/19/19 0034  WBC 6.5 5.7 6.7  HGB 11.8* 10.3* 10.1*  HCT 38.4 33.6* 31.6*  MCV 100.0 100.3* 97.8  PLT 185 140* 153     Medications:    . aspirin EC  81 mg Oral Daily  . atorvastatin  80 mg Oral q1800  . carvedilol  6.25 mg Oral BID WC  . Chlorhexidine Gluconate Cloth  6 each Topical Q0600  . epoetin alfa  3,000 Units Subcutaneous Once  . gabapentin  300 mg Oral QHS  . heparin      . heparin  5,000 Units Subcutaneous Q8H  . insulin aspart  0-5 Units Subcutaneous QHS  . insulin aspart  0-6 Units Subcutaneous TID WC  . levothyroxine  25 mcg Oral Daily  . multivitamin  1 tablet Oral QHS  . pantoprazole  40 mg Oral Daily  . sertraline  25 mg Oral Daily  . [START ON 10/20/2019] sodium chloride flush  10-40 mL Intracatheter Q12H  . sodium chloride flush  3 mL Intravenous Once  . sodium chloride flush  3 mL Intravenous Q12H  . ticagrelor  90 mg Oral BID   Elmarie Shiley, MD 10/19/2019, 6:58 AM

## 2019-10-19 NOTE — Progress Notes (Signed)
  Echocardiogram 2D Echocardiogram has been performed.  Jennette Dubin 10/19/2019, 8:39 AM

## 2019-10-19 NOTE — Progress Notes (Signed)
Inpatient Diabetes Program Recommendations  AACE/ADA: New Consensus Statement on Inpatient Glycemic Control (2015)  Target Ranges:  Prepandial:   less than 140 mg/dL      Peak postprandial:   less than 180 mg/dL (1-2 hours)      Critically ill patients:  140 - 180 mg/dL   Results for Melanie Hall, Melanie Hall (MRN [id number]) as of 10/19/2019 07:46  Ref. Range 10/18/2019 15:19 10/18/2019 20:15 10/18/2019 21:40 10/18/2019 22:13 10/18/2019 23:56 10/19/2019 06:50  Glucose-Capillary Latest Ref Range: 70 - 99 mg/dL 213 (H) 194 (H) 188 (H) 169 (H) 213 (H) 235 (H)   Results for Melanie Hall, Melanie Hall (MRN [id number]) as of 10/19/2019 07:46  Ref. Range 09/10/2019 18:20  Hemoglobin A1C Latest Ref Range: 4.8 - 5.6 % 7.2 (H)    Admit with: STEMI  History: DM, ESRD  Home DM Meds: Novolog 70/30 Insulin 10 units BID with meals  Current Orders: Novolog 0-6 units TID AC + HS     MD- Please consider adding some basal insulin to pt's current inpatient insulin regimen.  Takes 70/30 Insulin BID at home (which can be resumed at time of discharge).  Recommend Levemir 10 units Daily (please start today)  This would be about 80% of the total amount of basal insulin she gets in her 2 doses of 70/30 insulin at home     --Will follow patient during hospitalization--  Wyn Quaker RN, MSN, CDE Diabetes Coordinator Inpatient Glycemic Control Team Team Pager: [phone] (8a-5p)

## 2019-10-20 DIAGNOSIS — I255 Ischemic cardiomyopathy: Secondary | ICD-10-CM

## 2019-10-20 LAB — GLUCOSE, CAPILLARY
Glucose-Capillary: 120 mg/dL — ABNORMAL HIGH (ref 70–99)
Glucose-Capillary: 117 mg/dL — ABNORMAL HIGH (ref 70–99)
Glucose-Capillary: 118 mg/dL — ABNORMAL HIGH (ref 70–99)
Glucose-Capillary: 237 mg/dL — ABNORMAL HIGH (ref 70–99)
Glucose-Capillary: 180 mg/dL — ABNORMAL HIGH (ref 70–99)

## 2019-10-20 LAB — TROPONIN I (HIGH SENSITIVITY): Troponin I (High Sensitivity): 20885 ng/L (ref <18)

## 2019-10-20 LAB — HEMOGLOBIN A1C
Hgb A1c MFr Bld: 7.6 % — ABNORMAL HIGH (ref 4.8–5.6)
Mean Plasma Glucose: 171 mg/dL

## 2019-10-20 LAB — TSH: TSH: 5.115 u[IU]/mL — ABNORMAL HIGH (ref 0.350–4.500)

## 2019-10-20 LAB — POCT ACTIVATED CLOTTING TIME: Activated Clotting Time: 213 seconds

## 2019-10-20 NOTE — Progress Notes (Signed)
Pt asked RN if she could have her daughter bring in a chicken biscuit for breakfast.  This RN advised and educated pt about Na, fried foods, and non-compliance. Daughter brought in biscuit and patient ate.   Kathleene Hazel RN

## 2019-10-20 NOTE — Progress Notes (Signed)
Progress Note  Patient Name: Melanie Hall Date of Encounter: 10/20/2019  Primary Cardiologist: Dr. Sherren Mocha  Subjective   Postop day #2 inferior STEMI treated with PCI and drug-eluting stenting of the distal RCA.  She had hemodialysis yesterday.  She is otherwise asymptomatic.  She has not yet been out of bed.  Inpatient Medications    Scheduled Meds: . aspirin EC  81 mg Oral Daily  . atorvastatin  80 mg Oral q1800  . carvedilol  6.25 mg Oral BID WC  . Chlorhexidine Gluconate Cloth  6 each Topical Q0600  . gabapentin  300 mg Oral QHS  . heparin  5,000 Units Subcutaneous Q8H  . insulin aspart  0-5 Units Subcutaneous QHS  . insulin aspart  0-6 Units Subcutaneous TID WC  . insulin aspart protamine- aspart  10 Units Subcutaneous BID WC  . levothyroxine  25 mcg Oral Daily  . multivitamin  1 tablet Oral QHS  . pantoprazole  40 mg Oral Daily  . sertraline  25 mg Oral Daily  . sodium chloride flush  10-40 mL Intracatheter Q12H  . sodium chloride flush  3 mL Intravenous Once  . sodium chloride flush  3 mL Intravenous Q12H  . ticagrelor  90 mg Oral BID   Continuous Infusions: . sodium chloride 10 mL/hr at 10/20/19 0800   PRN Meds: sodium chloride, acetaminophen, alteplase, heparin, heparin, nitroGLYCERIN, ondansetron (ZOFRAN) IV, sodium chloride flush, sodium chloride flush, zolpidem   Vital Signs    Vitals:   10/20/19 0500 10/20/19 0600 10/20/19 0732 10/20/19 0800  BP: 115/74 (!) 119/57  120/78  Pulse: 76 76  74  Resp: (!) 22 19  20   Temp:   (!) 96.1 F (35.6 C)   TempSrc:   Axillary   SpO2: 98% 98%  100%  Weight:      Height:        Intake/Output Summary (Last 24 hours) at 10/20/2019 0902 Last data filed at 10/20/2019 0800 Gross per 24 hour  Intake 479.56 ml  Output 2000 ml  Net -1520.44 ml   Last 3 Weights 10/19/2019 10/19/2019 10/19/2019  Weight (lbs) 143 lb 4.8 oz 148 lb 2.4 oz 148 lb 13 oz  Weight (kg) 65 kg 67.2 kg 67.5 kg      Telemetry    Sinus  rhythm with nonsustained ventricular tachycardia- Personally Reviewed  ECG    Normal sinus rhythm at 71 with small inferior Q waves and inferolateral T wave inversion- Personally Reviewed  Physical Exam   GEN: No acute distress.   Neck: No JVD Cardiac: RRR, no murmurs, rubs, or gallops.  Respiratory: Clear to auscultation bilaterally. GI: Soft, nontender, non-distended  MS: No edema; No deformity. Neuro:  Nonfocal  Psych: Normal affect  Extremities-groin okay  Labs    High Sensitivity Troponin:   Recent Labs  Lab 10/18/19 1500 10/18/19 1742 10/20/19 0204  TROPONINIHS 948* 7,031* 20,885*      Chemistry Recent Labs  Lab 10/18/19 1500 10/18/19 1500 10/18/19 1742 10/18/19 2032 10/19/19 0034  NA 135  --   --  132* 133*  K 5.3*  --   --  5.2* 5.4*  CL 98  --   --  98 99  CO2 23  --   --  20* 21*  GLUCOSE 257*  --   --  197* 222*  BUN 28*  --   --  30* 31*  CREATININE 3.95*   < > 3.92* 4.03* 4.75*  CALCIUM 9.3  --   --  8.7* 8.6*  ALBUMIN  --   --   --  2.7* 2.5*  GFRNONAA 13*   < > 13* 12* 10*  GFRAA 15*   < > 15* 14* 12*  ANIONGAP 14  --   --  14 13   < > = values in this interval not displayed.     Hematology Recent Labs  Lab 10/18/19 1500 10/18/19 2032 10/19/19 0034  WBC 6.5 5.7 6.7  RBC 3.84* 3.35* 3.23*  HGB 11.8* 10.3* 10.1*  HCT 38.4 33.6* 31.6*  MCV 100.0 100.3* 97.8  MCH 30.7 30.7 31.3  MCHC 30.7 30.7 32.0  RDW 14.7 14.8 14.9  PLT 185 140* 153    BNPNo results for input(s): BNP, PROBNP in the last 168 hours.   DDimer No results for input(s): DDIMER in the last 168 hours.   Radiology    CARDIAC CATHETERIZATION  Result Date: 10/18/2019  Dist RCA lesion is 95% stenosed.  Mid RCA lesion is 50% stenosed.  A drug-eluting stent was successfully placed using a STENT RESOLUTE ONYX 3.5X18.  Post intervention, there is a 0% residual stenosis.  A drug-eluting stent was successfully placed using a STENT RESOLUTE ONYX 3.5X18.  Post intervention,  there is a 0% residual stenosis.  There is moderate to severe left ventricular systolic dysfunction.  LV end diastolic pressure is mildly elevated.  There is moderate (3+) mitral regurgitation.  1.  Severe single-vessel coronary artery disease with critical stenosis of the distal RCA, treated successfully with overlapping drug-eluting stents using intravascular ultrasound guidance (3.5 x 18 mm resolute Onyx DES x2) 2.  Patent LAD, diagonal branches, left main, and small left circumflex without significant stenoses 3.  Dense inferior infarct involving the entire inferior wall and inferoapex, LVEF estimated at 35% 4.  Moderate mitral regurgitation by left ventriculography 5.  Post PCI no reflow, treated with verapamil with modest improvement in flow. Recommendations: Guideline directed medical therapy.  Aspirin and ticagrelor at least 12 months.  Echocardiogram to assess LV function and mitral regurgitation.  Will contact nephrology for dialysis management.   DG Chest Portable 1 View  Result Date: 10/18/2019 CLINICAL DATA:  49 year old female with left-sided chest pain. EXAM: PORTABLE CHEST 1 VIEW COMPARISON:  Chest radiograph dated 09/09/2019. FINDINGS: Dialysis catheter in similar position. Moderate right and small left pleural effusion with associated compressive atelectasis of the lung bases. Superimposed pneumonia is not excluded. Overall significant interval improvement in the aeration of the lungs since the prior radiograph with resolution of the previously seen vascular congestion and edema. No pneumothorax. Stable cardiomegaly. No acute osseous pathology. IMPRESSION: 1. Resolution of the previously seen vascular congestion and edema. 2. Bilateral, right greater than left, pleural effusions with associated compressive atelectasis of the lung bases. Electronically Signed   By: Anner Crete M.D.   On: 10/18/2019 16:00   ECHOCARDIOGRAM LIMITED  Result Date: 10/19/2019    ECHOCARDIOGRAM LIMITED  REPORT   Patient Name:   Melanie Hall Date of Exam: 10/19/2019 Medical Rec #:  [id number]     Height:       62.5 in Accession #:    [id number]    Weight:       148.1 lb Date of Birth:  June 30, 1971     BSA:          1.693 m Patient Age:    49 years      BP:           117/83 mmHg Patient Gender: F  HR:           86 bpm. Exam Location:  Inpatient Procedure: Limited Echo, Limited Color Doppler and Cardiac Doppler                                 MODIFIED REPORT: This report was modified by Skeet Latch MD on 10/19/2019 due to Wall motion                               abnormalities added.  Indications:     121-121.4 ST elevation (STEMI) myocardial infarction  History:         Patient has prior history of Echocardiogram examinations, most                  recent 10/13/2019. Stroke, Signs/Symptoms:Chest Pain; Risk                  Factors:Hypertension, Dyslipidemia and Diabetes.  Sonographer:     Mikki Santee RDCS (AE) Referring Phys:  [place] Diagnosing Phys: Skeet Latch MD IMPRESSIONS  1. Left ventricular ejection fraction, by estimation, is 40 to 45%. The left ventricle has mildly decreased function. The left ventricle demonstrates regional wall motion abnormalities (see scoring diagram/findings for description). There is mild concentric left ventricular hypertrophy. Left ventricular diastolic parameters are consistent with Grade I diastolic dysfunction (impaired relaxation). Elevated left ventricular end-diastolic pressure.  2. Large pleural effusion in the left lateral region.  3. Mild mitral valve regurgitation.  4. The aortic valve is tricuspid.  5. There is normal pulmonary artery systolic pressure.  6. The inferior vena cava is dilated in size with <50% respiratory variability, suggesting right atrial pressure of 15 mmHg. FINDINGS  Left Ventricle: Left ventricular ejection fraction, by estimation, is 40 to 45%. The left ventricle has mildly decreased function. The left ventricle  demonstrates regional wall motion abnormalities. There is mild concentric left ventricular hypertrophy.  Elevated left ventricular end-diastolic pressure.  LV Wall Scoring: The inferior septum, entire inferior wall, and posterior wall are hypokinetic. The entire anterior wall, antero-lateral wall, anterior septum, apical lateral segment, and apex are normal. Right Ventricle: There is normal pulmonary artery systolic pressure. The tricuspid regurgitant velocity is 1.80 m/s, and with an assumed right atrial pressure of 15 mmHg, the estimated right ventricular systolic pressure is 21.3 mmHg. Left Atrium: Left atrial size was normal in size. Right Atrium: Right atrial size was normal in size. Mitral Valve: Mild mitral valve regurgitation. Tricuspid Valve: Tricuspid valve regurgitation is mild. Aortic Valve: The aortic valve is tricuspid. Venous: The inferior vena cava is dilated in size with less than 50% respiratory variability, suggesting right atrial pressure of 15 mmHg. Additional Comments: Inferior hypokinesis consistent with prior infarct. There is a large pleural effusion in the left lateral region.  LEFT VENTRICLE PLAX 2D LVIDd:         4.20 cm     Diastology LVIDs:         3.30 cm     LV e' lateral:   5.48 cm/s LV PW:         1.10 cm     LV E/e' lateral: 14.9 LV IVS:        1.10 cm     LV e' medial:    5.03 cm/s LVOT diam:     2.00 cm     LV E/e' medial:  16.2 LV SV:         62 LV SV Index:   36 LVOT Area:     3.14 cm  LV Volumes (MOD) LV vol d, MOD A2C: 76.6 ml LV vol d, MOD A4C: 64.9 ml LV vol s, MOD A2C: 43.2 ml LV vol s, MOD A4C: 34.6 ml LV SV MOD A2C:     33.4 ml LV SV MOD A4C:     64.9 ml LV SV MOD BP:      31.3 ml LEFT ATRIUM             Index       RIGHT ATRIUM           Index LA diam:        3.90 cm 2.30 cm/m  RA Area:     12.80 cm LA Vol (A2C):   43.8 ml 25.87 ml/m RA Volume:   31.90 ml  18.84 ml/m LA Vol (A4C):   40.4 ml 23.87 ml/m LA Biplane Vol: 44.1 ml 26.05 ml/m  AORTIC VALVE LVOT Vmax:    83.30 cm/s LVOT Vmean:  59.600 cm/s LVOT VTI:    0.196 m  AORTA Ao Root diam: 2.20 cm MITRAL VALVE               TRICUSPID VALVE MV Area (PHT): 4.06 cm    TR Peak grad:   13.0 mmHg MV Decel Time: 187 msec    TR Vmax:        180.00 cm/s MV E velocity: 81.50 cm/s MV A velocity: 84.80 cm/s  SHUNTS MV E/A ratio:  0.96        Systemic VTI:  0.20 m                            Systemic Diam: 2.00 cm Skeet Latch MD Electronically signed by Skeet Latch MD Signature Date/Time: 10/19/2019/10:59:30 AM    Final (Updated)      Cardiac catheterization/PCI and stent (10/18/2019)  Conclusion     Dist RCA lesion is 95% stenosed.  Mid RCA lesion is 50% stenosed.  A drug-eluting stent was successfully placed using a STENT RESOLUTE ONYX 3.5X18.  Post intervention, there is a 0% residual stenosis.  A drug-eluting stent was successfully placed using a STENT RESOLUTE ONYX 3.5X18.  Post intervention, there is a 0% residual stenosis.  There is moderate to severe left ventricular systolic dysfunction.  LV end diastolic pressure is mildly elevated.  There is moderate (3+) mitral regurgitation.   1.  Severe single-vessel coronary artery disease with critical stenosis of the distal RCA, treated successfully with overlapping drug-eluting stents using intravascular ultrasound guidance (3.5 x 18 mm resolute Onyx DES x2) 2.  Patent LAD, diagonal branches, left main, and small left circumflex without significant stenoses 3.  Dense inferior infarct involving the entire inferior wall and inferoapex, LVEF estimated at 35% 4.  Moderate mitral regurgitation by left ventriculography 5.  Post PCI no reflow, treated with verapamil with modest improvement in flow.   Recommendations: Guideline directed medical therapy.  Aspirin and ticagrelor at least 12 months.  Echocardiogram to assess LV function and mitral regurgitation.  Will contact nephrology for dialysis management.   Coronary  Diagrams  Diagnostic Dominance: Right  Intervention   2D echocardiogram (10/19/2019)  IMPRESSIONS    1. Left ventricular ejection fraction, by estimation, is 40 to 45%. The  left ventricle has mildly decreased function. The left ventricle  demonstrates regional  wall motion abnormalities (see scoring  diagram/findings for description). There is mild  concentric left ventricular hypertrophy. Left ventricular diastolic  parameters are consistent with Grade I diastolic dysfunction (impaired  relaxation). Elevated left ventricular end-diastolic pressure.  2. Large pleural effusion in the left lateral region.  3. Mild mitral valve regurgitation.  4. The aortic valve is tricuspid.  5. There is normal pulmonary artery systolic pressure.  6. The inferior vena cava is dilated in size with <50% respiratory  variability, suggesting right atrial pressure of 15 mmHg.   Patient Profile     49 y.o. female history of diabetes, hypertension, and hyperlipidemia on hemodialysis for the last 2 months.  She presented with inferior STEMI yesterday afternoon and underwent PCI and drug-eluting stenting x2 by Dr. Burt Knack to the distal RCA using ultrasound guidance.  She had a good angiographic result with some mild reflow requiring intra-coronary verapamil.  Her troponins rose to 7000.  She is currently pain-free, hemodynamically stable on optimal medical therapy including dual antiplatelet therapy with aspirin and Brilinta.  Assessment & Plan    1: Coronary artery disease-postop day #2 inferior STEMI treated PCI drug-eluting stenting x2 of the distal RCA by Dr. Burt Knack.  She had otherwise no significant CAD.  She is on dual antiplatelet therapy including aspirin and Brilinta.  She has had no recurrent chest pain.  Her groin is stable.  2: Ischemic cardiomyopathy -EF 35% with moderate mitral regurgitation on left ventriculography.  Her EF week ago was normal.  2D echo performed yesterday revealed EF of  40 to 45% with mild MR.  Her troponins rose to 7000.  I suspect some of this is stunned and that she will get recovery of LV function with optimal medical therapy.  She is on carvedilol 6.25 mg p.o. twice daily which we can titrate depending on her blood pressure.  We will leave further pharmacotherapy to be determined as an outpatient depending on her blood pressure and EF.  3: Hyperlipidemia-on high-dose statin therapy  4: Insulin-dependent diabetes-her 70/30 insulin was restarted and she is on sliding scale  5: Chronic renal sufficiency-on hemodialysis   Ms. Reining is stable today postop day 2 anterior MI.  EF is in the 40 to 45% range.  She has not been out of bed.  Plans are to transfer to telemetry floor, ambulate and potential discharge tomorrow afternoon after she undergoes hemodialysis.  She will need TOC 7 and follow-up with Dr. Burt Knack.  For questions or updates, please contact [place] Please consult [email or web address] for contact info under        Signed, Quay Burow, MD  10/19/2019, 9:07 AM

## 2019-10-20 NOTE — Progress Notes (Signed)
Patient ID: Melanie Hall, female   DOB: Jan 05, 1971, 49 y.o.   MRN: [id number] [facility] Progress Note   Assessment/ Plan:   1.  Acute ST elevation myocardial infarction: Status post emergent coronary catheterization with PCI (stents) to distal RCA.  She is on aspirin, Lipitor and Brilinta and plans noted for monitoring/mobilization per cardiology. 2. ESRD: Usually on a Monday/Wednesday/Friday dialysis schedule and will order for hemodialysis tomorrow if she is still here.  Will order for labs tomorrow morning. 3. Anemia: Without significant/overt loss noted status post coronary angiography, will continue to monitor H/H trend. 4. CKD-MBD: Calcium and phosphorus level currently within acceptable range, continue to monitor on current renal diet/hemodialysis. 5. Nutrition: Continue surveillance of albumin levels on renal diet/binders. 6. Hypertension: Blood pressure currently under acceptable control, efforts at volume unloading based on LVEDP.  Subjective:   Underwent hemodialysis yesterday without problems.  Denies any overnight complaints.   Objective:   BP (!) 119/57   Pulse 76   Temp 98.5 F (36.9 C) (Axillary)   Resp 19   Ht 5' 2.5" (1.588 m)   Wt 65 kg   LMP 12/17/2016   SpO2 98%   BMI 25.79 kg/m   Physical Exam: Gen: Comfortably resting in bed CVS: Pulse regular rhythm, normal rate, S1 and S2 normal Resp: Anteriorly clear to auscultation bilaterally.  No rales or rhonchi.  Right IJ TDC. Abd: Soft, flat, nontender, bowel sounds normal Ext: No lower extremity edema.  Left brachiocephalic fistula with palpable thrill.  Labs: BMET Recent Labs  Lab 10/18/19 1500 10/18/19 1742 10/18/19 2032 10/19/19 0034  NA 135  --  132* 133*  K 5.3*  --  5.2* 5.4*  CL 98  --  98 99  CO2 23  --  20* 21*  GLUCOSE 257*  --  197* 222*  BUN 28*  --  30* 31*  CREATININE 3.95* 3.92* 4.03* 4.75*  CALCIUM 9.3  --  8.7* 8.6*  PHOS  --   --  5.0* 5.2*   CBC Recent Labs  Lab  10/18/19 1500 10/18/19 2032 10/19/19 0034  WBC 6.5 5.7 6.7  HGB 11.8* 10.3* 10.1*  HCT 38.4 33.6* 31.6*  MCV 100.0 100.3* 97.8  PLT 185 140* 153     Medications:    . aspirin EC  81 mg Oral Daily  . atorvastatin  80 mg Oral q1800  . carvedilol  6.25 mg Oral BID WC  . Chlorhexidine Gluconate Cloth  6 each Topical Q0600  . gabapentin  300 mg Oral QHS  . heparin  5,000 Units Subcutaneous Q8H  . insulin aspart  0-5 Units Subcutaneous QHS  . insulin aspart  0-6 Units Subcutaneous TID WC  . insulin aspart protamine- aspart  10 Units Subcutaneous BID WC  . levothyroxine  25 mcg Oral Daily  . multivitamin  1 tablet Oral QHS  . pantoprazole  40 mg Oral Daily  . sertraline  25 mg Oral Daily  . sodium chloride flush  10-40 mL Intracatheter Q12H  . sodium chloride flush  3 mL Intravenous Once  . sodium chloride flush  3 mL Intravenous Q12H  . ticagrelor  90 mg Oral BID   Elmarie Shiley, MD 10/20/2019, 6:29 AM

## 2019-10-20 NOTE — Progress Notes (Signed)
CARDIAC REHAB PHASE I   PRE:  Rate/Rhythm: 77 SR  BP:  Sitting: 111/75      SaO2: 100 RA  MODE:  Ambulation: 80 ft   POST:  Rate/Rhythm: 120 ST  BP:  Sitting: 89/65    SaO2: 95 RA  Pt ambulated 78ft in hallway assist of one with front wheel walker and gait belt. Pt denied CP and dizziness throughout the walk, did c/o SOB upon return to room. Pt able to maintain sats on RA. MI and stent education completed with pt and daughter. Pt educated on importance of ASA and Brilinta. Pt given MI book and stent card. Deferred diet to her dialysis center. Encouraged ambulation as able. Reviewed restrictions and site care. Will refer to CRP II [place]. Unsure if pt is strong enough to attend or if it will work with her dialysis schedule.   [id number] Melanie Falco, RN BSN 10/20/2019 11:20 AM

## 2019-10-20 NOTE — Care Management (Signed)
Cost of Brilinta is $450-500 retail for uninsured patients.  Patient can be sent home w 30 day free crad that would provide 30 days of medication at no cost to patient.  If patient has good, compliant follow up,  MD offices may be able to supply samples and assist with application for patient assistance.  ASA, Plavix may be a viable alternative that would be more affordable.

## 2019-10-21 DIAGNOSIS — I5021 Acute systolic (congestive) heart failure: Secondary | ICD-10-CM

## 2019-10-21 HISTORY — DX: Acute systolic (congestive) heart failure: I50.21

## 2019-10-21 LAB — BASIC METABOLIC PANEL
Anion gap: 14 (ref 5–15)
BUN: 38 mg/dL — ABNORMAL HIGH (ref 6–20)
CO2: 21 mmol/L — ABNORMAL LOW (ref 22–32)
Calcium: 8.4 mg/dL — ABNORMAL LOW (ref 8.9–10.3)
Chloride: 98 mmol/L (ref 98–111)
Creatinine, Ser: 4.72 mg/dL — ABNORMAL HIGH (ref 0.44–1.00)
GFR calc Af Amer: 12 mL/min — ABNORMAL LOW (ref >60)
GFR calc non Af Amer: 10 mL/min — ABNORMAL LOW (ref >60)
Glucose, Bld: 149 mg/dL — ABNORMAL HIGH (ref 70–99)
Potassium: 4.4 mmol/L (ref 3.5–5.1)
Sodium: 133 mmol/L — ABNORMAL LOW (ref 135–145)

## 2019-10-21 LAB — CBC
HCT: 31.2 % — ABNORMAL LOW (ref 36.0–46.0)
Hemoglobin: 9.7 g/dL — ABNORMAL LOW (ref 12.0–15.0)
MCH: 31.6 pg (ref 26.0–34.0)
MCHC: 31.1 g/dL (ref 30.0–36.0)
MCV: 101.6 fL — ABNORMAL HIGH (ref 80.0–100.0)
Platelets: 131 10*3/uL — ABNORMAL LOW (ref 150–400)
RBC: 3.07 MIL/uL — ABNORMAL LOW (ref 3.87–5.11)
RDW: 15.4 % (ref 11.5–15.5)
WBC: 5.6 10*3/uL (ref 4.0–10.5)
nRBC: 0 % (ref 0.0–0.2)

## 2019-10-21 LAB — GLUCOSE, CAPILLARY
Glucose-Capillary: 165 mg/dL — ABNORMAL HIGH (ref 70–99)
Glucose-Capillary: 101 mg/dL — ABNORMAL HIGH (ref 70–99)
Glucose-Capillary: 85 mg/dL (ref 70–99)
Glucose-Capillary: 116 mg/dL — ABNORMAL HIGH (ref 70–99)

## 2019-10-21 MED ORDER — ATORVASTATIN CALCIUM 80 MG PO TABS: 80.0000 mg | ORAL_TABLET | Freq: Every day | ORAL | 1 refills | Status: DC

## 2019-10-21 MED ORDER — ASPIRIN 81 MG PO TBEC: 81.0000 mg | DELAYED_RELEASE_TABLET | Freq: Every day | ORAL | 1 refills | Status: DC

## 2019-10-21 MED ORDER — TICAGRELOR 90 MG PO TABS: 90.0000 mg | ORAL_TABLET | Freq: Two times a day (BID) | ORAL | 2 refills | Status: DC

## 2019-10-21 MED ORDER — HEPARIN SODIUM (PORCINE) 1000 UNIT/ML DIALYSIS: 40.0000 [IU]/kg | INTRAMUSCULAR | Status: DC | PRN

## 2019-10-21 MED ORDER — HEPARIN SODIUM (PORCINE) 1000 UNIT/ML IJ SOLN
INTRAMUSCULAR | Status: AC
  Filled 2019-10-21: qty 4

## 2019-10-21 MED ORDER — NITROGLYCERIN 0.4 MG SL SUBL: 0.4000 mg | SUBLINGUAL_TABLET | SUBLINGUAL | 1 refills | Status: DC | PRN

## 2019-10-21 MED FILL — ASPIRIN LOW DOSE 81 MG TBEC: 81 | 90 days supply | Qty: 90 | Fill #0

## 2019-10-21 MED FILL — ATORVASTATIN CALCIUM 80 MG: 80 | 30 days supply | Qty: 30 | Fill #0

## 2019-10-21 MED FILL — NITROGLYCERIN 0.4 MG TAB SL: 0.4 | 7 days supply | Qty: 25 | Fill #0

## 2019-10-21 MED FILL — BRILINTA 90 MG TABLET: 90 | 30 days supply | Qty: 60 | Fill #0

## 2019-10-21 NOTE — Progress Notes (Addendum)
Progress Note  Patient Name: Melanie Hall Date of Encounter: 10/21/2019  Primary Cardiologist: No primary care provider on file.   Subjective   Reports intermittent right arm numbness from her elbow down. Mild chest discomfort in the center of chest.   Inpatient Medications    Scheduled Meds: . aspirin EC  81 mg Oral Daily  . atorvastatin  80 mg Oral q1800  . carvedilol  6.25 mg Oral BID WC  . Chlorhexidine Gluconate Cloth  6 each Topical Q0600  . gabapentin  300 mg Oral QHS  . heparin  5,000 Units Subcutaneous Q8H  . insulin aspart  0-5 Units Subcutaneous QHS  . insulin aspart  0-6 Units Subcutaneous TID WC  . insulin aspart protamine- aspart  10 Units Subcutaneous BID WC  . levothyroxine  25 mcg Oral Daily  . multivitamin  1 tablet Oral QHS  . pantoprazole  40 mg Oral Daily  . sertraline  25 mg Oral Daily  . sodium chloride flush  10-40 mL Intracatheter Q12H  . sodium chloride flush  3 mL Intravenous Once  . sodium chloride flush  3 mL Intravenous Q12H  . ticagrelor  90 mg Oral BID   Continuous Infusions: . sodium chloride 10 mL/hr at 10/20/19 1000   PRN Meds: sodium chloride, acetaminophen, alteplase, heparin, heparin, nitroGLYCERIN, ondansetron (ZOFRAN) IV, sodium chloride flush, sodium chloride flush, zolpidem   Vital Signs    Vitals:   10/20/19 1546 10/20/19 2138 10/21/19 0013 10/21/19 0509  BP: 124/82 103/76 103/68 108/70  Pulse: 81 82 80 76  Resp:  (!) 22 20 20   Temp: 98 F (36.7 C) 98.2 F (36.8 C) 98.2 F (36.8 C) 98.2 F (36.8 C)  TempSrc: Oral Oral Oral Oral  SpO2: 91% 98% 99% 99%  Weight:    68 kg  Height:        Intake/Output Summary (Last 24 hours) at 10/21/2019 0840 Last data filed at 10/20/2019 1626 Gross per 24 hour  Intake 499.98 ml  Output --  Net 499.98 ml   Last 3 Weights 10/21/2019 10/19/2019 10/19/2019  Weight (lbs) 149 lb 14.6 oz 143 lb 4.8 oz 148 lb 2.4 oz  Weight (kg) 68 kg 65 kg 67.2 kg      Telemetry    SR - Personally  Reviewed  ECG    No new tracing this morning.   Physical Exam  Pleasant female, sitting up in bed.  GEN: No acute distress.   Neck: No JVD Cardiac: RRR, no murmurs, rubs, or gallops.  Respiratory: Clear to auscultation bilaterally. GI: Soft, nontender, non-distended  MS: No edema; No deformity. Neuro:  Nonfocal  Psych: Normal affect   Labs    High Sensitivity Troponin:   Recent Labs  Lab 10/18/19 1500 10/18/19 1742 10/20/19 0204  TROPONINIHS 948* 7,031* 20,885*      Chemistry Recent Labs  Lab 10/18/19 1500 10/18/19 1500 10/18/19 1742 10/18/19 2032 10/19/19 0034  NA 135  --   --  132* 133*  K 5.3*  --   --  5.2* 5.4*  CL 98  --   --  98 99  CO2 23  --   --  20* 21*  GLUCOSE 257*  --   --  197* 222*  BUN 28*  --   --  30* 31*  CREATININE 3.95*   < > 3.92* 4.03* 4.75*  CALCIUM 9.3  --   --  8.7* 8.6*  ALBUMIN  --   --   --  2.7* 2.5*  GFRNONAA 13*   < > 13* 12* 10*  GFRAA 15*   < > 15* 14* 12*  ANIONGAP 14  --   --  14 13   < > = values in this interval not displayed.     Hematology Recent Labs  Lab 10/18/19 1500 10/18/19 2032 10/19/19 0034  WBC 6.5 5.7 6.7  RBC 3.84* 3.35* 3.23*  HGB 11.8* 10.3* 10.1*  HCT 38.4 33.6* 31.6*  MCV 100.0 100.3* 97.8  MCH 30.7 30.7 31.3  MCHC 30.7 30.7 32.0  RDW 14.7 14.8 14.9  PLT 185 140* 153    BNPNo results for input(s): BNP, PROBNP in the last 168 hours.   DDimer No results for input(s): DDIMER in the last 168 hours.   Radiology    No results found.  Cardiac Studies   Cardiac catheterization/PCI and stent (10/18/2019)  Conclusion    Dist RCA lesion is 95% stenosed.  Mid RCA lesion is 50% stenosed.  A drug-eluting stent was successfully placed using a STENT RESOLUTE ONYX 3.5X18.  Post intervention, there is a 0% residual stenosis.  A drug-eluting stent was successfully placed using a STENT RESOLUTE ONYX 3.5X18.  Post intervention, there is a 0% residual stenosis.  There is moderate to  severe left ventricular systolic dysfunction.  LV end diastolic pressure is mildly elevated.  There is moderate (3+) mitral regurgitation.  1. Severe single-vessel coronary artery disease with critical stenosis of the distal RCA, treated successfully with overlapping drug-eluting stents using intravascular ultrasound guidance (3.5 x 18 mm resolute Onyx DES x2) 2. Patent LAD, diagonal branches, left main, and small left circumflex without significant stenoses 3. Dense inferior infarct involving the entire inferior wall and inferoapex, LVEF estimated at 35% 4. Moderate mitral regurgitation by left ventriculography 5. Post PCI no reflow, treated with verapamil with modest improvement in flow.  Recommendations: Guideline directed medical therapy. Aspirin and ticagrelor at least 12 months. Echocardiogram to assess LV function and mitral regurgitation. Will contact nephrology for dialysis management.   Coronary Diagrams  Diagnostic Dominance: Right  Intervention   2D echocardiogram (10/19/2019)  IMPRESSIONS    1. Left ventricular ejection fraction, by estimation, is 40 to 45%. The  left ventricle has mildly decreased function. The left ventricle  demonstrates regional wall motion abnormalities (see scoring  diagram/findings for description). There is mild  concentric left ventricular hypertrophy. Left ventricular diastolic  parameters are consistent with Grade I diastolic dysfunction (impaired  relaxation). Elevated left ventricular end-diastolic pressure.  2. Large pleural effusion in the left lateral region.  3. Mild mitral valve regurgitation.  4. The aortic valve is tricuspid.  5. There is normal pulmonary artery systolic pressure.  6. The inferior vena cava is dilated in size with <50% respiratory  variability, suggesting right atrial pressure of 15 mmHg.    Patient Profile     49 y.o. female  history of diabetes, hypertension, and hyperlipidemia on  hemodialysis for the last 2 months.  She presented with inferior STEMI yesterday afternoon and underwent PCI and drug-eluting stenting x2 by Dr. Burt Knack to the distal RCA using ultrasound guidance.  She had a good angiographic result with some mild reflow requiring intra-coronary verapamil.  Her troponins rose to 7000.  She is currently pain-free, hemodynamically stable on optimal medical therapy including dual antiplatelet therapy with aspirin and Brilinta.  Assessment & Plan    1. Coronary artery disease: treated PCI drug-eluting stenting x2 of the distal RCA by Dr. Burt Knack.  She had otherwise  no significant CAD. hs[place] [address]. She is on dual antiplatelet therapy including aspirin and Brilinta.  Worked with cardiac rehab yesterday without chest pain. Planned to ambulate again today.   2. Ischemic cardiomyopathy: EF 35% with moderate mitral regurgitation on left ventriculography.  Her EF week ago was normal.  2D echo performed this admission revealed EF of 40 to 45% with mild MR.  -- on carvedilol 6.25 mg p.o. twice daily with blood pressures tolerating. Will hold on further titration as her BP is soft and she is planned for HD today.    3. Hyperlipidemia: on high-dose statin therapy  4. Insulin-dependent diabetes: on 70/30 insulin along with SSI  5. Chronic renal sufficiency: on hemodialysis, planned for HD today  6. Hyperkalemia: K+ 5.2 4/12, repeat BMET pending this morning.  For questions or updates, please contact [place] Please consult [email or web address] for contact info under        Signed, Reino Bellis, NP  10/21/2019, 8:40 AM    Agree with note by Reino Bellis NP-C  Postop day 3 inferior STEMI treated with PCI and drug-eluting stenting x2 by Dr. Burt Knack on Sunday.  She does have ischemic cardiomyopathy with an EF in the 40 to 45% range on appropriate medications.  Other problems as outlined and addressed on high-dose statin therapy.  She is scheduled for hemodialysis  today.  I believe she stable for discharge after that with close outpatient follow-up.  Her exam is benign.  She has occasional atypical chest pain.  Lorretta Harp, M.D., [place], [facility], [place] [address] [place], [place]  [address]  [phone] 10/21/2019 10:12 AM

## 2019-10-21 NOTE — Progress Notes (Signed)
Patient c/o numbness feeling of rt arm,Neuro check done, no deviation noted. Able to move all extremities with no weakness noted, bilateral  hand grip equal and strong, no facial drooping ,speech clear,  denies any headache or  pain ,denies SOB nor any vision changes. Vital signs stable , NSR per tele monitor. Assist repositioning in bed for comfort. Dr Blossom Hoops notified, stated will see & evaluate patient. Patient reassurance offered. Patient requested drink and snack , tolerated food  with no discomforts. Kept patient monitored.

## 2019-10-21 NOTE — Progress Notes (Signed)
CARDIAC REHAB PHASE I   PRE:  Rate/Rhythm: 82 SR  BP:  Sitting: 130/81      SaO2: 97 RA  MODE:  Ambulation: 90 ft   POST:  Rate/Rhythm: 88 SR  BP:  Sitting: 111/84    SaO2: 100 RA  Pt helped to BR, than ambulated 46ft in hallway assist of one with front wheel walker. Pt states she continues to feel stronger. C/o some SOB but denies CP. Encouraged continued ambulation. Waiting on HD.  [id number] Rufina Falco, RN BSN 10/21/2019 11:28 AM

## 2019-10-21 NOTE — Discharge Summary (Addendum)
Discharge Summary    Patient ID: ASA FATH,  MRN: [id number], DOB/AGE: August 29, 1970 49 y.o.  Admit date: 10/18/2019 Discharge date: 10/22/2019  Primary Care Provider: Soyla Dryer Primary Cardiologist: Sherren Mocha, MD (Possible long term follow up in [place])  Discharge Diagnoses    Principal Problem:   STEMI (ST elevation myocardial infarction) [facility]) Active Problems:   Type 2 diabetes with nephropathy ([place])   HYPERCHOLESTEROLEMIA   Hypertension associated with diabetes ([place])   ESRD (end stage renal disease) on dialysis [facility])   STEMI involving right coronary artery ([place])   Acute systolic heart failure ([place])   Allergies No Known Allergies  Diagnostic Studies/Procedures    Cath: 10/18/19  Dist RCA lesion is 95% stenosed. Mid RCA lesion is 50% stenosed. A drug-eluting stent was successfully placed using a STENT RESOLUTE ONYX 3.5X18. Post intervention, there is a 0% residual stenosis. A drug-eluting stent was successfully placed using a STENT RESOLUTE ONYX 3.5X18. Post intervention, there is a 0% residual stenosis. There is moderate to severe left ventricular systolic dysfunction. LV end diastolic pressure is mildly elevated. There is moderate (3+) mitral regurgitation.   1.  Severe single-vessel coronary artery disease with critical stenosis of the distal RCA, treated successfully with overlapping drug-eluting stents using intravascular ultrasound guidance (3.5 x 18 mm resolute Onyx DES x2) 2.  Patent LAD, diagonal branches, left main, and small left circumflex without significant stenoses 3.  Dense inferior infarct involving the entire inferior wall and inferoapex, LVEF estimated at 35% 4.  Moderate mitral regurgitation by left ventriculography 5.  Post PCI no reflow, treated with verapamil with modest improvement in flow.   Recommendations: Guideline directed medical therapy.  Aspirin and ticagrelor at least 12 months.  Echocardiogram to assess LV function  and mitral regurgitation.  Will contact nephrology for dialysis management.  Diagnostic Dominance: Right  Intervention   Echo: 10/19/19  IMPRESSIONS     1. Left ventricular ejection fraction, by estimation, is 40 to 45%. The  left ventricle has mildly decreased function. The left ventricle  demonstrates regional wall motion abnormalities (see scoring  diagram/findings for description). There is mild  concentric left ventricular hypertrophy. Left ventricular diastolic  parameters are consistent with Grade I diastolic dysfunction (impaired  relaxation). Elevated left ventricular end-diastolic pressure.   2. Large pleural effusion in the left lateral region.   3. Mild mitral valve regurgitation.   4. The aortic valve is tricuspid.   5. There is normal pulmonary artery systolic pressure.   6. The inferior vena cava is dilated in size with <50% respiratory  variability, suggesting right atrial pressure of 15 mmHg.  _____________   History of Present Illness     Ms. Melanie Hall is a 49 yo who presented with chest pain and nausea since the night prior to admission. EKG in the ED showed acute inferior/posterior injury. The patient was interviewed on arrival to the cardiac catheterization lab.  She still complained of 10/10 chest pain that was a pressure-like sensation in the center of the chest into the left upper arm. There was associated nausea.  No shortness of breath, orthopnea, PND, or syncope.  She had not missed any recent dialysis sessions.  She reported only having been on hemodialysis for about 2 months.  She was currently dialyzing through temporary catheter and she has a left arm fistula that was not mature.  She has had diabetes for several years. The patient is a non-smoker.  There is a family history of coronary artery  disease in her mother and grandmother.  No premature CAD in the family.  Hospital Course     Consultants: Nephrology   1. Coronary artery disease: treated PCI  drug-eluting stenting x2 of the distal RCA by Dr. Burt Knack.  She had otherwise no significant CAD. hs[place] [address]. Placed on dual antiplatelet therapy including aspirin and Brilinta.  Worked with cardiac rehab without chest pain.  2. Ischemic cardiomyopathy: Her EF one week prior to admission was normal. 2D echo performed this admission revealed EF of 40 to 45% with mild MR and hypokinesis in the inferior septum, and posterior wall.  -- on carvedilol 6.25 mg p.o. twice daily with blood pressures tolerating. Will hold on further titration as her BP is soft requires HD. No ARB/ACE with ESRD -- volume managed with HD.  3. Hyperlipidemia: on high-dose statin therapy -- will need FLP/LFTs in 8 weeks  4. Insulin-dependent diabetes: on 70/30 insulin. Seen by the diabetes coordinator for education during admission.    5. ESRD on HD: on hemodialysis, seen by nephrology for HD needs while inpatient.  -- Seen by the renal navigator during admission.    6. Hyperkalemia: K+ slightly elevated on admission. 4.4 on the day of discharge.    [name] was seen by Dr. Gwenlyn Found and determined stable for discharge home. Follow up in the office has been arranged. Medications are listed below.   _____________  Discharge Vitals Blood pressure 135/76, pulse 86, temperature 98.1 F (36.7 C), temperature source Oral, resp. rate 17, height 5' 2.5" (1.588 m), weight 65.8 kg, last menstrual period 12/17/2016, SpO2 94 %.  Filed Weights   10/21/19 1340 10/21/19 1722 10/22/19 0658  Weight: 67.7 kg 65 kg 65.8 kg    Labs & Radiologic Studies    CBC Recent Labs    10/21/19 0945  WBC 5.6  HGB 9.7*  HCT 31.2*  MCV 101.6*  PLT 681*   Basic Metabolic Panel Recent Labs    10/21/19 0945  NA 133*  K 4.4  CL 98  CO2 21*  GLUCOSE 149*  BUN 38*  CREATININE 4.72*  CALCIUM 8.4*   Liver Function Tests No results for input(s): AST, ALT, ALKPHOS, BILITOT, PROT, ALBUMIN in the last 72 hours. No results for  input(s): LIPASE, AMYLASE in the last 72 hours. Cardiac Enzymes No results for input(s): CKTOTAL, CKMB, CKMBINDEX, TROPONINI in the last 72 hours. BNP Invalid input(s): POCBNP D-Dimer No results for input(s): DDIMER in the last 72 hours. Hemoglobin A1C No results for input(s): HGBA1C in the last 72 hours. Fasting Lipid Panel No results for input(s): CHOL, HDL, LDLCALC, TRIG, CHOLHDL, LDLDIRECT in the last 72 hours. Thyroid Function Tests Recent Labs    10/20/19 0158  TSH 5.115*   _____________  CARDIAC CATHETERIZATION  Result Date: 10/18/2019  Dist RCA lesion is 95% stenosed.  Mid RCA lesion is 50% stenosed.  A drug-eluting stent was successfully placed using a STENT RESOLUTE ONYX 3.5X18.  Post intervention, there is a 0% residual stenosis.  A drug-eluting stent was successfully placed using a STENT RESOLUTE ONYX 3.5X18.  Post intervention, there is a 0% residual stenosis.  There is moderate to severe left ventricular systolic dysfunction.  LV end diastolic pressure is mildly elevated.  There is moderate (3+) mitral regurgitation.  1.  Severe single-vessel coronary artery disease with critical stenosis of the distal RCA, treated successfully with overlapping drug-eluting stents using intravascular ultrasound guidance (3.5 x 18 mm resolute Onyx DES x2) 2.  Patent LAD,  diagonal branches, left main, and small left circumflex without significant stenoses 3.  Dense inferior infarct involving the entire inferior wall and inferoapex, LVEF estimated at 35% 4.  Moderate mitral regurgitation by left ventriculography 5.  Post PCI no reflow, treated with verapamil with modest improvement in flow. Recommendations: Guideline directed medical therapy.  Aspirin and ticagrelor at least 12 months.  Echocardiogram to assess LV function and mitral regurgitation.  Will contact nephrology for dialysis management.   DG Chest Portable 1 View  Result Date: 10/18/2019 CLINICAL DATA:  49 year old female with  left-sided chest pain. EXAM: PORTABLE CHEST 1 VIEW COMPARISON:  Chest radiograph dated 09/09/2019. FINDINGS: Dialysis catheter in similar position. Moderate right and small left pleural effusion with associated compressive atelectasis of the lung bases. Superimposed pneumonia is not excluded. Overall significant interval improvement in the aeration of the lungs since the prior radiograph with resolution of the previously seen vascular congestion and edema. No pneumothorax. Stable cardiomegaly. No acute osseous pathology. IMPRESSION: 1. Resolution of the previously seen vascular congestion and edema. 2. Bilateral, right greater than left, pleural effusions with associated compressive atelectasis of the lung bases. Electronically Signed   By: Anner Crete M.D.   On: 10/18/2019 16:00   ECHOCARDIOGRAM COMPLETE  Result Date: 10/13/2019    ECHOCARDIOGRAM REPORT   Patient Name:   SHIRLYN SAVIN Date of Exam: 10/13/2019 Medical Rec #:  [id number]     Height:       63.0 in Accession #:    [id number]    Weight:       154.1 lb Date of Birth:  06/04/1971     BSA:          1.731 m Patient Age:    69 years      BP:           161/85 mmHg Patient Gender: F             HR:           79 bpm. Exam Location:  [facility] Procedure: 2D Echo, Cardiac Doppler and Color Doppler Indications:    I31.3 (ICD-10-CM) - Pericardial effusion  History:        Patient has prior history of Echocardiogram examinations, most                 recent 08/14/2019. Risk Factors:Hypertension, Dyslipidemia and                 Diabetes. Acute on chronic renal failure,Stroke (HCC) (From Hx),                 Pericardial Effusion.  Sonographer:    Alvino Chapel RCS Referring Phys: [id number] [place]  1. Left ventricular ejection fraction, by estimation, is 60 to 65%. The left ventricle has normal function. Left ventricular endocardial border not optimally defined to evaluate regional wall motion. Left ventricular diastolic parameters are  indeterminate.  2. Right ventricular systolic function is normal. The right ventricular size is normal. There is moderately elevated pulmonary artery systolic pressure.  3. The mitral valve is normal in structure. Trivial mitral valve regurgitation. No evidence of mitral stenosis.  4. The aortic valve is tricuspid. Aortic valve regurgitation is not visualized. No aortic stenosis is present.  5. The inferior vena cava is dilated in size with <50% respiratory variability, suggesting right atrial pressure of 15 mmHg. FINDINGS  Left Ventricle: Left ventricular ejection fraction, by estimation, is 60 to 65%. The left ventricle has normal function. Left  ventricular endocardial border not optimally defined to evaluate regional wall motion. The left ventricular internal cavity size was normal in size. There is no left ventricular hypertrophy. Left ventricular diastolic parameters are indeterminate. Right Ventricle: The right ventricular size is normal. No increase in right ventricular wall thickness. Right ventricular systolic function is normal. There is moderately elevated pulmonary artery systolic pressure. The tricuspid regurgitant velocity is 2.69 m/s, and with an assumed right atrial pressure of 15 mmHg, the estimated right ventricular systolic pressure is 78.5 mmHg. Left Atrium: Left atrial size was normal in size. Right Atrium: Right atrial size was normal in size. Pericardium: There is no evidence of pericardial effusion. Mitral Valve: The mitral valve is normal in structure. Trivial mitral valve regurgitation. No evidence of mitral valve stenosis. Tricuspid Valve: The tricuspid valve is normal in structure. Tricuspid valve regurgitation is mild . No evidence of tricuspid stenosis. Aortic Valve: The aortic valve is tricuspid. Aortic valve regurgitation is not visualized. No aortic stenosis is present. Aortic valve mean gradient measures 4.3 mmHg. Aortic valve peak gradient measures 9.2 mmHg. Aortic valve area, by  VTI measures 2.18 cm. Pulmonic Valve: The pulmonic valve was not well visualized. Pulmonic valve regurgitation is mild. No evidence of pulmonic stenosis. Aorta: The aortic root is normal in size and structure. Venous: The inferior vena cava is dilated in size with less than 50% respiratory variability, suggesting right atrial pressure of 15 mmHg. IAS/Shunts: No atrial level shunt detected by color flow Doppler.  LEFT VENTRICLE PLAX 2D LVIDd:         4.45 cm  Diastology LVIDs:         2.61 cm  LV e' lateral:   8.05 cm/s LV PW:         0.98 cm  LV E/e' lateral: 13.9 LV IVS:        1.03 cm  LV e' medial:    6.64 cm/s LVOT diam:     1.80 cm  LV E/e' medial:  16.9 LV SV:         64 LV SV Index:   37 LVOT Area:     2.54 cm  RIGHT VENTRICLE RV Mid diam:    3.17 cm RV S prime:     10.80 cm/s TAPSE (M-mode): 1.8 cm LEFT ATRIUM             Index LA diam:        3.80 cm 2.20 cm/m LA Vol (A2C):   38.5 ml 22.24 ml/m LA Vol (A4C):   46.8 ml 27.04 ml/m LA Biplane Vol: 43.4 ml 25.07 ml/m  AORTIC VALVE AV Area (Vmax):    1.93 cm AV Area (Vmean):   2.09 cm AV Area (VTI):     2.18 cm AV Vmax:           152.01 cm/s AV Vmean:          95.979 cm/s AV VTI:            0.294 m AV Peak Grad:      9.2 mmHg AV Mean Grad:      4.3 mmHg LVOT Vmax:         115.00 cm/s LVOT Vmean:        78.900 cm/s LVOT VTI:          0.252 m LVOT/AV VTI ratio: 0.86  AORTA Ao Root diam: 2.70 cm MITRAL VALVE                TRICUSPID VALVE  MV Area (PHT): 3.77 cm     TR Peak grad:   28.9 mmHg MV Decel Time: 201 msec     TR Vmax:        269.00 cm/s MV E velocity: 112.00 cm/s MV A velocity: 103.00 cm/s  SHUNTS MV E/A ratio:  1.09         Systemic VTI:  0.25 m                             Systemic Diam: 1.80 cm Carlyle Dolly MD Electronically signed by Carlyle Dolly MD Signature Date/Time: 10/13/2019/3:50:22 PM    Final    ECHOCARDIOGRAM LIMITED  Result Date: 10/19/2019    ECHOCARDIOGRAM LIMITED REPORT   Patient Name:   JACQULIN BRANDENBURGER Date of Exam:  10/19/2019 Medical Rec #:  [id number]     Height:       62.5 in Accession #:    [id number]    Weight:       148.1 lb Date of Birth:  05-Jul-1971     BSA:          1.693 m Patient Age:    59 years      BP:           117/83 mmHg Patient Gender: F             HR:           86 bpm. Exam Location:  Inpatient Procedure: Limited Echo, Limited Color Doppler and Cardiac Doppler                                 MODIFIED REPORT: This report was modified by Skeet Latch MD on 10/19/2019 due to Wall motion                               abnormalities added.  Indications:     121-121.4 ST elevation (STEMI) myocardial infarction  History:         Patient has prior history of Echocardiogram examinations, most                  recent 10/13/2019. Stroke, Signs/Symptoms:Chest Pain; Risk                  Factors:Hypertension, Dyslipidemia and Diabetes.  Sonographer:     Mikki Santee RDCS (AE) Referring Phys:  [place] Diagnosing Phys: Skeet Latch MD IMPRESSIONS  1. Left ventricular ejection fraction, by estimation, is 40 to 45%. The left ventricle has mildly decreased function. The left ventricle demonstrates regional wall motion abnormalities (see scoring diagram/findings for description). There is mild concentric left ventricular hypertrophy. Left ventricular diastolic parameters are consistent with Grade I diastolic dysfunction (impaired relaxation). Elevated left ventricular end-diastolic pressure.  2. Large pleural effusion in the left lateral region.  3. Mild mitral valve regurgitation.  4. The aortic valve is tricuspid.  5. There is normal pulmonary artery systolic pressure.  6. The inferior vena cava is dilated in size with <50% respiratory variability, suggesting right atrial pressure of 15 mmHg. FINDINGS  Left Ventricle: Left ventricular ejection fraction, by estimation, is 40 to 45%. The left ventricle has mildly decreased function. The left ventricle demonstrates regional wall motion abnormalities. There is  mild concentric left ventricular hypertrophy.  Elevated left ventricular end-diastolic pressure.  LV Wall Scoring:  The inferior septum, entire inferior wall, and posterior wall are hypokinetic. The entire anterior wall, antero-lateral wall, anterior septum, apical lateral segment, and apex are normal. Right Ventricle: There is normal pulmonary artery systolic pressure. The tricuspid regurgitant velocity is 1.80 m/s, and with an assumed right atrial pressure of 15 mmHg, the estimated right ventricular systolic pressure is 82.5 mmHg. Left Atrium: Left atrial size was normal in size. Right Atrium: Right atrial size was normal in size. Mitral Valve: Mild mitral valve regurgitation. Tricuspid Valve: Tricuspid valve regurgitation is mild. Aortic Valve: The aortic valve is tricuspid. Venous: The inferior vena cava is dilated in size with less than 50% respiratory variability, suggesting right atrial pressure of 15 mmHg. Additional Comments: Inferior hypokinesis consistent with prior infarct. There is a large pleural effusion in the left lateral region.  LEFT VENTRICLE PLAX 2D LVIDd:         4.20 cm     Diastology LVIDs:         3.30 cm     LV e' lateral:   5.48 cm/s LV PW:         1.10 cm     LV E/e' lateral: 14.9 LV IVS:        1.10 cm     LV e' medial:    5.03 cm/s LVOT diam:     2.00 cm     LV E/e' medial:  16.2 LV SV:         62 LV SV Index:   36 LVOT Area:     3.14 cm  LV Volumes (MOD) LV vol d, MOD A2C: 76.6 ml LV vol d, MOD A4C: 64.9 ml LV vol s, MOD A2C: 43.2 ml LV vol s, MOD A4C: 34.6 ml LV SV MOD A2C:     33.4 ml LV SV MOD A4C:     64.9 ml LV SV MOD BP:      31.3 ml LEFT ATRIUM             Index       RIGHT ATRIUM           Index LA diam:        3.90 cm 2.30 cm/m  RA Area:     12.80 cm LA Vol (A2C):   43.8 ml 25.87 ml/m RA Volume:   31.90 ml  18.84 ml/m LA Vol (A4C):   40.4 ml 23.87 ml/m LA Biplane Vol: 44.1 ml 26.05 ml/m  AORTIC VALVE LVOT Vmax:   83.30 cm/s LVOT Vmean:  59.600 cm/s LVOT VTI:    0.196 m   AORTA Ao Root diam: 2.20 cm MITRAL VALVE               TRICUSPID VALVE MV Area (PHT): 4.06 cm    TR Peak grad:   13.0 mmHg MV Decel Time: 187 msec    TR Vmax:        180.00 cm/s MV E velocity: 81.50 cm/s MV A velocity: 84.80 cm/s  SHUNTS MV E/A ratio:  0.96        Systemic VTI:  0.20 m                            Systemic Diam: 2.00 cm Skeet Latch MD Electronically signed by Skeet Latch MD Signature Date/Time: 10/19/2019/10:59:30 AM    Final (Updated)    Disposition   Pt is being discharged home today in good condition.  Follow-up Plans & Appointments  Follow-up Information     Richardson Dopp T, PA-C Follow up on 11/03/2019.   Specialties: Cardiology, Physician Assistant Why: at 2:15pm for your follow up appt. Contact information: [address] [place] [address] [phone]           Discharge Instructions     Amb Referral to Cardiac Rehabilitation   Complete by: As directed    Diagnosis:  Coronary Stents STEMI     After initial evaluation and assessments completed: Virtual Based Care may be provided alone or in conjunction with Phase 2 Cardiac Rehab based on patient barriers.: Yes      Discharge Medications     Medication List     STOP taking these medications    amLODipine 5 MG tablet Commonly known as: NORVASC   levothyroxine 25 MCG tablet Commonly known as: SYNTHROID   lovastatin 20 MG tablet Commonly known as: MEVACOR   lovastatin 40 MG tablet Commonly known as: MEVACOR   pantoprazole 40 MG tablet Commonly known as: Protonix       TAKE these medications    aspirin 81 MG EC tablet Take 1 tablet (81 mg total) by mouth daily.   atorvastatin 80 MG tablet Commonly known as: LIPITOR Take 1 tablet (80 mg total) by mouth daily at 6 PM.   carvedilol 6.25 MG tablet Commonly known as: COREG Take 1 tablet (6.25 mg total) by mouth 2 (two) times daily with a meal.   epoetin alfa 3000 UNIT/ML injection Commonly known as:  EPOGEN Inject 3,000 Units into the skin every 14 (fourteen) days.   gabapentin 300 MG capsule Commonly known as: NEURONTIN Take 1 capsule (300 mg total) by mouth at bedtime.   insulin aspart protamine- aspart (70-30) 100 UNIT/ML injection Commonly known as: NOVOLOG MIX 70/30 Inject 10 Units into the skin 2 (two) times daily with a meal.   MULTI VITAMIN DAILY PO Take 1 tablet by mouth daily.   multivitamin Tabs tablet Take 1 tablet by mouth at bedtime.   nitroGLYCERIN 0.4 MG SL tablet Commonly known as: NITROSTAT Place 1 tablet (0.4 mg total) under the tongue every 5 (five) minutes x 3 doses as needed for chest pain.   sertraline 25 MG tablet Commonly known as: ZOLOFT Take 1 tablet (25 mg total) by mouth daily.   tamsulosin 0.4 MG Caps capsule Commonly known as: FLOMAX Take 1 capsule (0.4 mg total) by mouth daily after supper.   ticagrelor 90 MG Tabs tablet Commonly known as: BRILINTA Take 1 tablet (90 mg total) by mouth 2 (two) times daily.   zolpidem 5 MG tablet Commonly known as: AMBIEN Take 1 tablet (5 mg total) by mouth at bedtime as needed for sleep.        Yes                               AHA/ACC Clinical Performance & Quality Measures: Aspirin prescribed? - Yes ADP Receptor Inhibitor (Plavix/Clopidogrel, Brilinta/Ticagrelor or Effient/Prasugrel) prescribed (includes medically managed patients)? - Yes Beta Blocker prescribed? - Yes High Intensity Statin (Lipitor 40-80mg  or Crestor 20-40mg ) prescribed? - Yes EF assessed during THIS hospitalization? - Yes For EF <40%, was ACEI/ARB prescribed? - No - Reason:  ESRD on HD For EF <40%, Aldosterone Antagonist (Spironolactone or Eplerenone) prescribed? - No - Reason:  ESRD on HD Cardiac Rehab Phase II ordered (Included Medically managed Patients)? - Yes   Outstanding Labs/Studies   FLP/LFTs in  8 weeks  Duration of Discharge Encounter   Greater than 30 minutes including physician time.  Signed, Reino Bellis NP-C 10/22/2019, 8:15 AM Agree with note by Reino Bellis NP-C  Ms. Constantin was admitted with an inferior STEMI treated with PCI and stenting by Dr. Burt Knack.  She has moderate LV dysfunction.  She does have chronic renal insufficiency on dialysis.  She was on appropriate medical therapy and has remained pain-free.  She had dialysis last night and is stable for discharge this morning.  Lorretta Harp, M.D., [place], [facility], [place] [address] [place], [place]  [address]  [phone] 10/22/2019 11:36 AM

## 2019-10-21 NOTE — Progress Notes (Signed)
Patient woke up this am , stated rt arm still with numbness feeling. Moves hands freely , remain no neuro deficit noted, V/s stable. Harlan Stains NP notified.

## 2019-10-21 NOTE — Care Management (Signed)
1455 10-21-19 Patient is without insurance at this time. Currently in HD- Patient will receive 30 day free Brilinta leaving the hospital and may need to be changed to Plavix afterwards. Case Manager will try to utilize [facility] for additional medications. Medications will be delivered via the [facility] once patient is medically stable to transition home. Please hold patient until after 9:00 am to make sure she gets the appropriate medications from [place]. Case Manager will follow up in am regarding if the patient has a primary care provider that is affordable. Bethena Roys, RN,BSN Case Manager

## 2019-10-21 NOTE — Progress Notes (Signed)
Patient ID: Melanie Hall, female   DOB: Nov 11, 1970, 49 y.o.   MRN: [id number] [facility] Progress Note   Assessment/ Plan:   1.  Acute ST elevation myocardial infarction: Status post emergent coronary catheterization with PCI (stents) to distal RCA.  She is on aspirin, Lipitor and Brilinta and plans noted for close outpatient follow-up with cardiology. Plans in place for possible discharge home after dialysis if stable. 2. ESRD: On schedule for hemodialysis today to continue her usual outpatient MWF schedule. 3. Anemia: Without significant/overt loss noted status post coronary angiography, will continue to monitor H/H trend. 4. CKD-MBD: Calcium and phosphorus level currently within acceptable range, continue to monitor on current renal diet/hemodialysis. 5. Nutrition: Continue surveillance of albumin levels on renal diet/binders. 6. Hypertension: Blood pressure currently under acceptable control, monitor with ultrafiltration at hemodialysis.  Subjective:   Complains of some right-sided shoulder pain and right hand numbness this morning. Able to ambulate hallway yesterday without problems.   Objective:   BP 108/70 (BP Location: Right Arm)   Pulse 76   Temp 98.2 F (36.8 C) (Oral)   Resp 20   Ht 5' 2.5" (1.588 m)   Wt 68 kg   LMP 12/17/2016   SpO2 99%   BMI 26.98 kg/m   Physical Exam: Gen: Comfortably resting in bed CVS: Pulse regular rhythm, normal rate, S1 and S2 normal Resp: Anteriorly clear to auscultation bilaterally.  No rales or rhonchi.  Right IJ TDC. Abd: Soft, flat, nontender, bowel sounds normal Ext: No lower extremity edema.  Left brachiocephalic fistula with palpable thrill.  Labs: BMET Recent Labs  Lab 10/18/19 1500 10/18/19 1742 10/18/19 2032 10/19/19 0034  NA 135  --  132* 133*  K 5.3*  --  5.2* 5.4*  CL 98  --  98 99  CO2 23  --  20* 21*  GLUCOSE 257*  --  197* 222*  BUN 28*  --  30* 31*  CREATININE 3.95* 3.92* 4.03* 4.75*  CALCIUM 9.3   --  8.7* 8.6*  PHOS  --   --  5.0* 5.2*   CBC Recent Labs  Lab 10/18/19 1500 10/18/19 2032 10/19/19 0034  WBC 6.5 5.7 6.7  HGB 11.8* 10.3* 10.1*  HCT 38.4 33.6* 31.6*  MCV 100.0 100.3* 97.8  PLT 185 140* 153     Medications:    . aspirin EC  81 mg Oral Daily  . atorvastatin  80 mg Oral q1800  . carvedilol  6.25 mg Oral BID WC  . Chlorhexidine Gluconate Cloth  6 each Topical Q0600  . gabapentin  300 mg Oral QHS  . heparin  5,000 Units Subcutaneous Q8H  . insulin aspart  0-5 Units Subcutaneous QHS  . insulin aspart  0-6 Units Subcutaneous TID WC  . insulin aspart protamine- aspart  10 Units Subcutaneous BID WC  . levothyroxine  25 mcg Oral Daily  . multivitamin  1 tablet Oral QHS  . pantoprazole  40 mg Oral Daily  . sertraline  25 mg Oral Daily  . sodium chloride flush  10-40 mL Intracatheter Q12H  . sodium chloride flush  3 mL Intravenous Once  . sodium chloride flush  3 mL Intravenous Q12H  . ticagrelor  90 mg Oral BID   Elmarie Shiley, MD 10/21/2019, 10:50 AM

## 2019-10-22 DIAGNOSIS — E1159 Type 2 diabetes mellitus with other circulatory complications: Secondary | ICD-10-CM

## 2019-10-22 DIAGNOSIS — E7800 Pure hypercholesterolemia, unspecified: Secondary | ICD-10-CM

## 2019-10-22 DIAGNOSIS — I5021 Acute systolic (congestive) heart failure: Secondary | ICD-10-CM

## 2019-10-22 DIAGNOSIS — I10 Essential (primary) hypertension: Secondary | ICD-10-CM

## 2019-10-22 LAB — GLUCOSE, CAPILLARY: Glucose-Capillary: 174 mg/dL — ABNORMAL HIGH (ref 70–99)

## 2019-10-22 NOTE — Discharge Instructions (Signed)
Heart Attack The heart is a muscle that needs oxygen to survive. A heart attack is a condition that occurs when your heart does not get enough oxygen. When this happens, the heart muscle begins to die. This can cause permanent damage if not treated right away. A heart attack is a medical emergency. This condition may be called a myocardial infarction, or MI. It is also known as acute coronary syndrome (ACS). ACS is a term used to describe a group of conditions that affect blood flow to the heart. What are the causes? This condition may be caused by:  Atherosclerosis. This occurs when a fatty substance called plaque builds up in the arteries and blocks or reduces blood supply to the heart.  A blood clot. A blood clot can develop suddenly when plaque breaks up within an artery and blocks blood flow to the heart.  Low blood pressure.  An abnormal heartbeat (arrhythmia).  Conditions that cause a decrease of oxygen to the heart, such as anemiaorrespiratory failure.  A spasm, or severe tightening, of a blood vessel that cuts off blood flow to the heart.  Tearing of a coronary artery (spontaneous coronary artery dissection).  High blood pressure. What increases the risk? The following factors may make you more likely to develop this condition:  Aging. The older you are, the higher your risk.  Having a personal or family history of chest pain, heart attack, stroke, or narrowing of the arteries in the legs, arms, head, or stomach (peripheral artery disease).  Being female.  Smoking.  Not getting regular exercise.  Being overweight or obese.  Having high blood pressure.  Having high cholesterol (hypercholesterolemia).  Having diabetes.  Drinking too much alcohol.  Using illegal drugs, such as cocaine or methamphetamine. What are the signs or symptoms? Symptoms of this condition may vary, depending on factors like gender and age. Symptoms may include:  Chest pain. It may feel  like: ? Crushing or squeezing. ? Tightness, pressure, fullness, or heaviness.  Pain in the arm, neck, jaw, back, or upper body.  Shortness of breath.  Heartburn or upset stomach.  Nausea.  Sudden cold sweats.  Feeling tired.  Sudden light-headedness. How is this diagnosed? This condition may be diagnosed through tests, such as:  Electrocardiogram (ECG) to measure the electrical activity of your heart.  Blood tests to check for cardiac markers. These chemicals are released by a damaged heart muscle.  A test to evaluate blood flow and heart function (coronary angiogram).  CT scan to see the heart more clearly.  A test to evaluate the pumping action of the heart (echocardiogram). How is this treated? A heart attack must be treated as soon as possible. Treatment may include:  Medicines to: ? Break up or dissolve blood clots (fibrinolytic therapy). ? Thin blood and help prevent blood clots. ? Treat blood pressure. ? Improve blood flow to the heart. ? Reduce pain. ? Reduce cholesterol.  Angioplasty and stent placement. These are procedures to widen a blocked artery and keep it open.  Coronary artery bypass graft, CABG, or open heart surgery. This enables blood to flow to the heart by going around the blocked part of the artery.  Oxygen therapy if needed.  Cardiac rehabilitation. This improves your health and well-being through exercise, education, and counseling. Follow these instructions at home: Medicines  Take over-the-counter and prescription medicines only as told by your health care provider.  Do not take the following medicines unless your health care provider says it is okay  to take them: ? NSAIDs, such as ibuprofen. ? Supplements that contain vitamin A, vitamin E, or both. ? Hormone replacement therapy that contains estrogen with or without progestin. Lifestyle   Do not use any products that contain nicotine or tobacco, such as cigarettes, e-cigarettes,  and chewing tobacco. If you need help quitting, ask your health care provider.  Avoid secondhand smoke.  Exercise regularly. Ask your health care provider about participating in a cardiac rehabilitation program that helps you start exercising safely after a heart attack.  Eat a heart-healthy diet. Your health care provider will tell you what foods to eat.  Maintain a healthy weight.  Learn ways to manage stress.  Do not use illegal drugs. Alcohol use  Do not drink alcohol if: ? Your health care provider tells you not to drink. ? You are pregnant, may be pregnant, or are planning to become pregnant.  If you drink alcohol: ? Limit how much you use to:  0-1 drink a day for women.  0-2 drinks a day for men. ? Be aware of how much alcohol is in your drink. In the [place] one drink equals one 12 oz bottle of beer (355 mL), one 5 oz glass of wine (148 mL), or one 1 oz glass of hard liquor (44 mL). General instructions  Work with your health care provider to manage any other conditions you have, such as high blood pressure or diabetes. These conditions affect your heart.  Get screened for depression, and seek treatment if needed.  Keep your vaccinations up to date. Get the flu vaccine every year.  Keep all follow-up visits as told by your health care provider. This is important. Contact a health care provider if:  You feel overwhelmed or sad.  You have trouble doing your daily activities. Get help right away if:  You have sudden, unexplained discomfort in your chest, arms, back, neck, jaw, or upper body.  You have shortness of breath.  You suddenly start to sweat or your skin gets clammy.  You feel nauseous or you vomit.  You have unexplained tiredness or weakness.  You suddenly feel light-headed or dizzy.  You notice your heart starts to beat fast or feels like it is skipping beats.  You have blood pressure that is higher than 180/120. These symptoms may represent a  serious problem that is an emergency. Do not wait to see if the symptoms will go away. Get medical help right away. Call your local emergency services (911 in the [place] Do not drive yourself to the hospital. Summary  A heart attack, also called myocardial infarction, is a condition that occurs when your heart does not get enough oxygen. This is caused by anything that blocks or reduces blood flow to the heart.  Treatment is a combination of medicines and surgeries, if needed, to open the blocked arteries and restore blood flow to the heart.  A heart attack is an emergency. Get help right away if you have sudden discomfort in your chest, arms, back, neck, jaw, or upper body. Seek help if you feel nauseous, you vomit, or you feel light-headed or dizzy. This information is not intended to replace advice given to you by your health care provider. Make sure you discuss any questions you have with your health care provider. Document Revised: 10/02/2018 Document Reviewed: 10/06/2018 Elsevier Patient Education  [place].

## 2019-10-22 NOTE — Progress Notes (Signed)
Patient ID: Melanie Hall, female   DOB: [birth year], 49 y.o.   MRN: [id number] Plans noted for patient DC today. She is aware to go to her dialysis unit tomorrow for routine MWF HD.  Call with questions/concerns.  Elmarie Shiley MD [facility] Office # [phone] Pager # [phone] 10:22 AM

## 2019-10-22 NOTE — Care Management (Signed)
1042 10-22-19 Patient has an appointment at the free clinic next Thursday. No further needs from Case Manager at this time. Bethena Roys, RN,BSN Case Manager [phone]

## 2019-10-22 NOTE — Plan of Care (Signed)
Pt discharged to home with self care, d/c education written and verbal completed, TLC medications delivered, pt verbalized understanding of instructions, left facility with daughter in private vehicle.

## 2019-10-23 DIAGNOSIS — D631 Anemia in chronic kidney disease: Secondary | ICD-10-CM | POA: Diagnosis not present

## 2019-10-23 DIAGNOSIS — T82898A Other specified complication of vascular prosthetic devices, implants and grafts, initial encounter: Secondary | ICD-10-CM | POA: Diagnosis not present

## 2019-10-23 DIAGNOSIS — D509 Iron deficiency anemia, unspecified: Secondary | ICD-10-CM | POA: Diagnosis not present

## 2019-10-23 DIAGNOSIS — T82848A Pain from vascular prosthetic devices, implants and grafts, initial encounter: Secondary | ICD-10-CM | POA: Diagnosis not present

## 2019-10-23 DIAGNOSIS — Z992 Dependence on renal dialysis: Secondary | ICD-10-CM | POA: Diagnosis not present

## 2019-10-23 DIAGNOSIS — N186 End stage renal disease: Secondary | ICD-10-CM | POA: Diagnosis not present

## 2019-10-26 DIAGNOSIS — N186 End stage renal disease: Secondary | ICD-10-CM | POA: Diagnosis not present

## 2019-10-26 DIAGNOSIS — D631 Anemia in chronic kidney disease: Secondary | ICD-10-CM | POA: Diagnosis not present

## 2019-10-26 DIAGNOSIS — Z992 Dependence on renal dialysis: Secondary | ICD-10-CM | POA: Diagnosis not present

## 2019-10-26 DIAGNOSIS — T82848A Pain from vascular prosthetic devices, implants and grafts, initial encounter: Secondary | ICD-10-CM | POA: Diagnosis not present

## 2019-10-26 DIAGNOSIS — T82898A Other specified complication of vascular prosthetic devices, implants and grafts, initial encounter: Secondary | ICD-10-CM | POA: Diagnosis not present

## 2019-10-26 DIAGNOSIS — D509 Iron deficiency anemia, unspecified: Secondary | ICD-10-CM | POA: Diagnosis not present

## 2019-10-28 DIAGNOSIS — T82898A Other specified complication of vascular prosthetic devices, implants and grafts, initial encounter: Secondary | ICD-10-CM | POA: Diagnosis not present

## 2019-10-28 DIAGNOSIS — D631 Anemia in chronic kidney disease: Secondary | ICD-10-CM | POA: Diagnosis not present

## 2019-10-28 DIAGNOSIS — N186 End stage renal disease: Secondary | ICD-10-CM | POA: Diagnosis not present

## 2019-10-28 DIAGNOSIS — Z992 Dependence on renal dialysis: Secondary | ICD-10-CM | POA: Diagnosis not present

## 2019-10-28 DIAGNOSIS — T82848A Pain from vascular prosthetic devices, implants and grafts, initial encounter: Secondary | ICD-10-CM | POA: Diagnosis not present

## 2019-10-28 DIAGNOSIS — D509 Iron deficiency anemia, unspecified: Secondary | ICD-10-CM | POA: Diagnosis not present

## 2019-10-29 ENCOUNTER — Ambulatory Visit: Payer: Self-pay | Admitting: Physician Assistant

## 2019-10-29 ENCOUNTER — Encounter: Payer: Self-pay | Admitting: Physician Assistant

## 2019-10-29 DIAGNOSIS — E1121 Type 2 diabetes mellitus with diabetic nephropathy: Secondary | ICD-10-CM

## 2019-10-29 DIAGNOSIS — I2510 Atherosclerotic heart disease of native coronary artery without angina pectoris: Secondary | ICD-10-CM

## 2019-10-29 DIAGNOSIS — G4700 Insomnia, unspecified: Secondary | ICD-10-CM

## 2019-10-29 DIAGNOSIS — E785 Hyperlipidemia, unspecified: Secondary | ICD-10-CM

## 2019-10-29 DIAGNOSIS — N186 End stage renal disease: Secondary | ICD-10-CM

## 2019-10-29 DIAGNOSIS — I10 Essential (primary) hypertension: Secondary | ICD-10-CM

## 2019-10-29 MED ORDER — TAMSULOSIN HCL 0.4 MG PO CAPS: 0.4000 mg | ORAL_CAPSULE | Freq: Every day | ORAL | 0 refills | Status: DC

## 2019-10-29 MED ORDER — LEVOTHYROXINE SODIUM 25 MCG PO TABS: 25.0000 ug | ORAL_TABLET | Freq: Every day | ORAL | 1 refills | Status: DC

## 2019-10-29 MED ORDER — ZOLPIDEM TARTRATE 5 MG PO TABS: 5.0000 mg | ORAL_TABLET | Freq: Every evening | ORAL | 0 refills | Status: DC | PRN

## 2019-10-29 MED ORDER — SERTRALINE HCL 25 MG PO TABS: 25.0000 mg | ORAL_TABLET | Freq: Every day | ORAL | 0 refills | Status: DC

## 2019-10-29 NOTE — Progress Notes (Signed)
LMP 12/17/2016    Subjective:    Patient ID: Melanie Hall, female    DOB: 1971-06-28, 49 y.o.   MRN: [id number]  HPI: Melanie Hall is a 49 y.o. female presenting on 10/29/2019 for No chief complaint on file.   HPI   This is a telemedicine appointment through [employer] due to coronavirus pandemic.    I connected with  Melanie Hall on 10/29/19 by a video enabled telemedicine application and verified that I am speaking with the correct person using two identifiers.   I discussed the limitations of evaluation and management by telemedicine. The patient expressed understanding and agreed to proceed.    Pt is 3yoF with ESRD who is on dialysis and has medicaid approval pending.    Pt just got out of hospital after MI.  She is still staying in [place] with her daughter.  She says she is still waiting on medicaid.  She says they've been trying to call.  She has follow up with cardiology next week.  reviewed meds.  She Asks about hctz - she is not taking it but has it in her bag.  Pt told to not take it as it appears to have been discontinued while in hospital.    Also Stopped: amlodipine pantoprazozle levothyroxine lovastatin  during recent hosptialization  Pt does not check her bs ever (her machine is at her home and she hasn't been there in a long time).   She confirms that she is taking 70/30 insulin.  a1c was 7.6 on 10/18/19.    She says she is doing okay today.  She requests refill on her [place].   Relevant past medical, surgical, family and social history reviewed and updated as indicated. Interim medical history since our last visit reviewed. Allergies and medications reviewed and updated.   Current Outpatient Medications:  .  aspirin EC 81 MG EC tablet, Take 1 tablet (81 mg total) by mouth daily., Disp: 90 tablet, Rfl: 1 .  atorvastatin (LIPITOR) 80 MG tablet, Take 1 tablet (80 mg total) by mouth daily at 6 PM., Disp: 90 tablet, Rfl: 1 .  carvedilol (COREG) 6.25 MG tablet,  Take 1 tablet (6.25 mg total) by mouth 2 (two) times daily with a meal., Disp: 60 tablet, Rfl: 1 .  epoetin alfa (EPOGEN) 3000 UNIT/ML injection, Inject 3,000 Units into the skin every 14 (fourteen) days. , Disp: , Rfl:  .  gabapentin (NEURONTIN) 300 MG capsule, Take 1 capsule (300 mg total) by mouth at bedtime., Disp: 30 capsule, Rfl: 0 .  insulin aspart protamine- aspart (NOVOLOG MIX 70/30) (70-30) 100 UNIT/ML injection, Inject 10 Units into the skin 2 (two) times daily with a meal. , Disp: , Rfl:  .  multivitamin (RENA-VIT) TABS tablet, Take 1 tablet by mouth at bedtime., Disp: 30 tablet, Rfl: 0 .  nitroGLYCERIN (NITROSTAT) 0.4 MG SL tablet, Place 1 tablet (0.4 mg total) under the tongue every 5 (five) minutes x 3 doses as needed for chest pain., Disp: 25 tablet, Rfl: 1 .  sertraline (ZOLOFT) 25 MG tablet, Take 1 tablet (25 mg total) by mouth daily., Disp: 30 tablet, Rfl: 0 .  tamsulosin (FLOMAX) 0.4 MG CAPS capsule, Take 1 capsule (0.4 mg total) by mouth daily after supper., Disp: 30 capsule, Rfl: 1 .  ticagrelor (BRILINTA) 90 MG TABS tablet, Take 1 tablet (90 mg total) by mouth 2 (two) times daily., Disp: 180 tablet, Rfl: 2 .  zolpidem (AMBIEN) 5 MG tablet, Take 1 tablet (  5 mg total) by mouth at bedtime as needed for sleep., Disp: 15 tablet, Rfl: 0    Review of Systems  Per HPI unless specifically indicated above     Objective:    LMP 12/17/2016   Wt Readings from Last 3 Encounters:  10/22/19 145 lb (65.8 kg)  09/18/19 154 lb 1.6 oz (69.9 kg)  08/25/19 170 lb 3.1 oz (77.2 kg)    Physical Exam Constitutional:      General: She is not in acute distress. HENT:     Head: Normocephalic and atraumatic.  Pulmonary:     Effort: No respiratory distress.  Neurological:     Mental Status: She is alert and oriented to person, place, and time.  Psychiatric:        Attention and Perception: Attention normal.        Speech: Speech normal.        Behavior: Behavior is cooperative.      Exam limited due to appointment is virtual       Assessment & Plan:    Encounter Diagnoses  Name Primary?  . Type 2 diabetes with nephropathy ([place]) Yes  . ESRD (end stage renal disease) on dialysis ([place])   . Coronary artery disease involving native heart without angina pectoris, unspecified vessel or lesion type   . Insomnia, unspecified type   . Essential hypertension   . Hyperlipidemia, unspecified hyperlipidemia type       -Pt to Check on medicaid.  This office will also check on that and [facility] financial assistance  -pt to Get back on thyroid med/levothyroxine  -Will change insulin to something that works better than the 70/30 but discussed with pt that she will need to come into the office to pick up supplies and she will need to get her blood sugar monitor and start checking her bs regularly  -pt to Continue with dialysis and cardiology.  -Pt to follow up in office with bs log in 1 month.  She is to contact office sooner prn

## 2019-11-02 DIAGNOSIS — N186 End stage renal disease: Secondary | ICD-10-CM | POA: Diagnosis not present

## 2019-11-02 DIAGNOSIS — D509 Iron deficiency anemia, unspecified: Secondary | ICD-10-CM | POA: Diagnosis not present

## 2019-11-02 DIAGNOSIS — T82898A Other specified complication of vascular prosthetic devices, implants and grafts, initial encounter: Secondary | ICD-10-CM | POA: Diagnosis not present

## 2019-11-02 DIAGNOSIS — Z992 Dependence on renal dialysis: Secondary | ICD-10-CM | POA: Diagnosis not present

## 2019-11-02 DIAGNOSIS — T82848A Pain from vascular prosthetic devices, implants and grafts, initial encounter: Secondary | ICD-10-CM | POA: Diagnosis not present

## 2019-11-02 DIAGNOSIS — D631 Anemia in chronic kidney disease: Secondary | ICD-10-CM | POA: Diagnosis not present

## 2019-11-03 ENCOUNTER — Other Ambulatory Visit: Payer: Self-pay

## 2019-11-03 ENCOUNTER — Ambulatory Visit (INDEPENDENT_AMBULATORY_CARE_PROVIDER_SITE_OTHER): Payer: Self-pay | Admitting: Physician Assistant

## 2019-11-03 ENCOUNTER — Encounter: Payer: Self-pay | Admitting: Physician Assistant

## 2019-11-03 ENCOUNTER — Ambulatory Visit
Admission: RE | Admit: 2019-11-03 | Discharge: 2019-11-03 | Disposition: A | Discharge status: Home / Self Care | Payer: Self-pay | Source: Ambulatory Visit | Attending: Physician Assistant | Admitting: Physician Assistant

## 2019-11-03 VITALS — BP 150/80 | Ht 63.0 in | Wt 149.1 lb

## 2019-11-03 DIAGNOSIS — N186 End stage renal disease: Secondary | ICD-10-CM

## 2019-11-03 DIAGNOSIS — E1121 Type 2 diabetes mellitus with diabetic nephropathy: Secondary | ICD-10-CM

## 2019-11-03 DIAGNOSIS — E782 Mixed hyperlipidemia: Secondary | ICD-10-CM

## 2019-11-03 DIAGNOSIS — I5022 Chronic systolic (congestive) heart failure: Secondary | ICD-10-CM

## 2019-11-03 DIAGNOSIS — I2111 ST elevation (STEMI) myocardial infarction involving right coronary artery: Secondary | ICD-10-CM

## 2019-11-03 DIAGNOSIS — R0902 Hypoxemia: Secondary | ICD-10-CM

## 2019-11-03 DIAGNOSIS — J90 Pleural effusion, not elsewhere classified: Secondary | ICD-10-CM | POA: Diagnosis not present

## 2019-11-03 DIAGNOSIS — Z992 Dependence on renal dialysis: Secondary | ICD-10-CM

## 2019-11-03 IMAGING — DX DG CHEST 2V
2 series · 2 of 2 positions shown · non-contrast
Comparison: [DATE], [DATE]

CLINICAL DATA: Hypoxia

EXAM:
CHEST - 2 VIEW

[dg chest 2 view (1 of 2)]
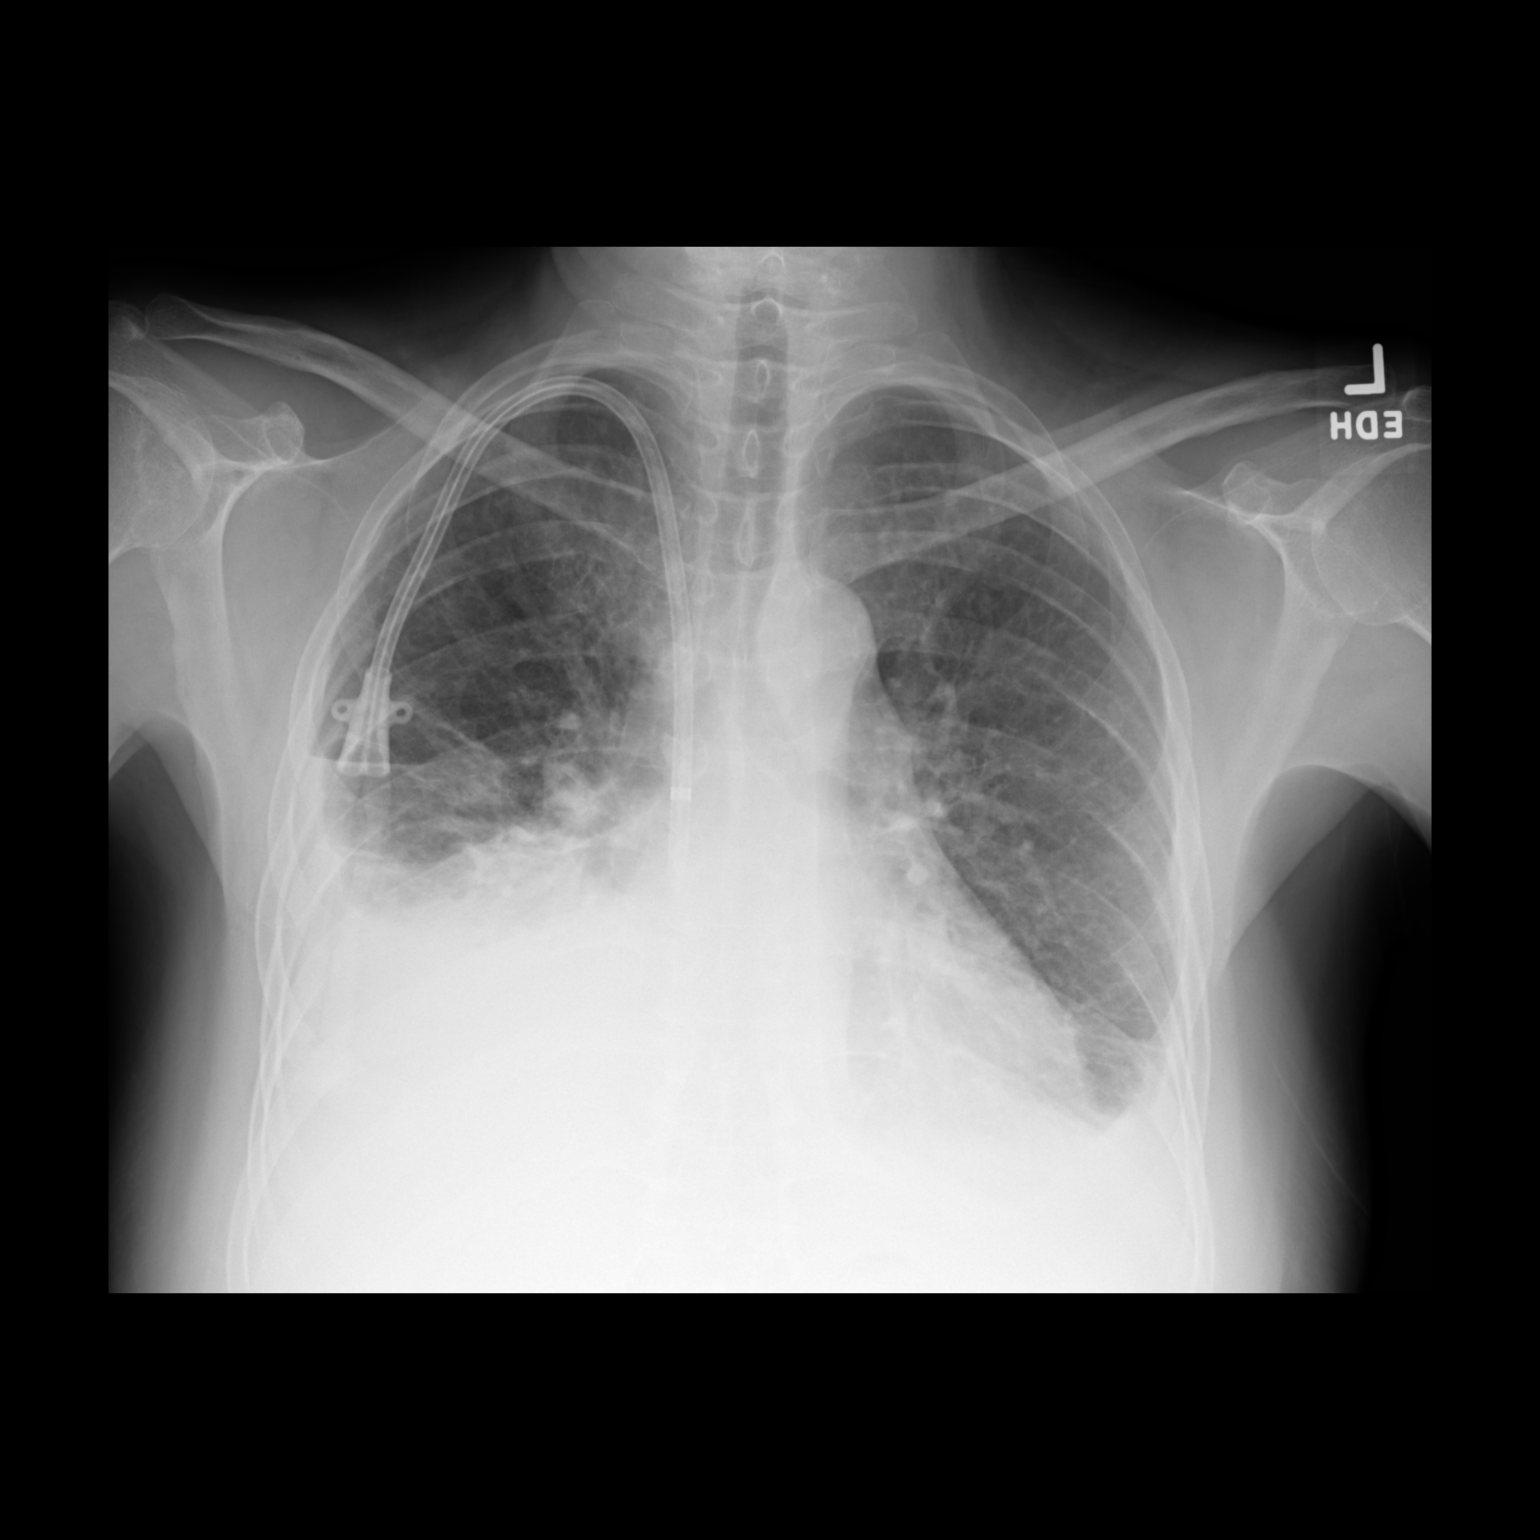

[dg chest 2 view (2 of 2)]
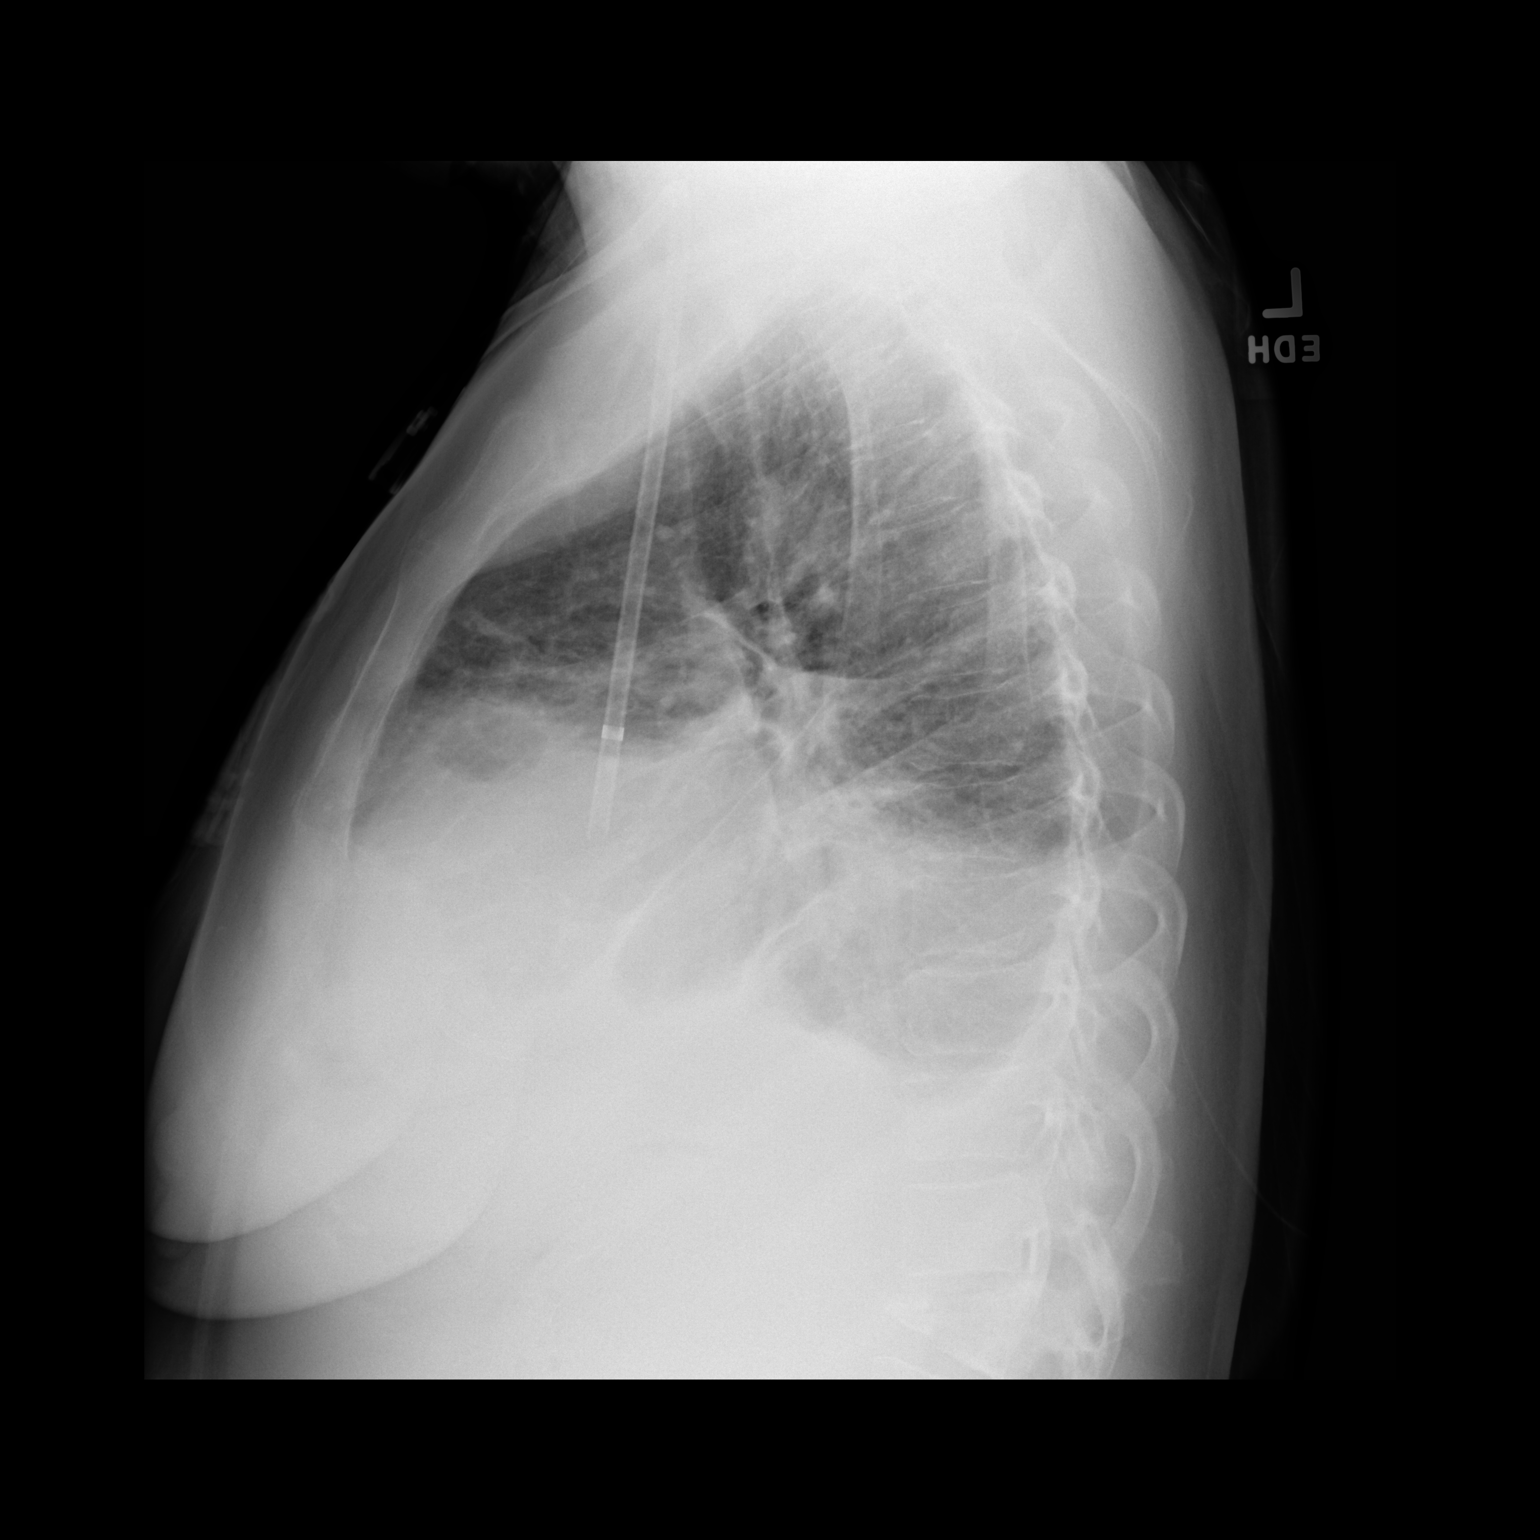

[2 of 2 positions shown; findings below may reference images not displayed]

FINDINGS: Right-sided central venous catheter tip projects over the cavoatrial
region. Moderate right pleural effusion, appears increased compared
to prior. Small left-sided pleural effusion, also likely increased.
Enlarged cardiomediastinal silhouette with vascular congestion.
Basilar consolidations.

Meniscus level over the right mid chest.
IMPRESSION: 1. Moderate right pleural effusion, increased compared to prior with
increased airspace disease at the right middle lobe and right base.
Meniscus level in the right lateral chest, could relate to a
cavitary process. Chest CT is suggested to further evaluate this
finding.
2. Small left pleural effusion, also increased compared to prior
3. Cardiomegaly with vascular congestion.

## 2019-11-03 MED ORDER — CARVEDILOL 6.25 MG PO TABS
9.3750 mg | ORAL_TABLET | Freq: Two times a day (BID) | ORAL | 3 refills | Status: DC
Start: 2019-11-03 — End: 2019-11-24

## 2019-11-03 NOTE — Progress Notes (Signed)
Cardiology Office Note:    Date:  11/03/2019   ID:  [name], DOB [birth year], MRN [id number]  PCP:  Soyla Dryer, PA-C  Cardiologist:  Sherren Mocha, MD   Electrophysiologist:  None   Referring MD: Soyla Dryer, PA-C   Chief Complaint:  Hospitalization Follow-up (s/p MI >> PCI to RCA)    Patient Profile:    Melanie Hall is a 49 y.o. female with:   Coronary artery disease   S/p late presenting Inf STEMI 10/2019 >> PCI: DES x 2 to RCA  Chronic systolic CHF  Ischemic cardiomyopathy   Echocardiogram 10/19/2019: EF 40-45, inf WMA, Gr 1 DD, Lg L pl Eff  ESRD  Diabetes mellitus  Hypertension   Hyperlipidemia   Prior CV studies: Limited Echocardiogram 10/19/2019 EF 40-45, inferior septum, inferior wall and posterior wall hypokinetic, mild conc LVH, Gr 1 DD, Large L pleural Eff, mild MR  Cardiac catheterization 10/18/2019 LAD irregularities LCx irregularities RCA mid 50, distal 95 (thrombotic) Inferior scar, EF 35, moderate MR PCI: 3.5 x 18 mm Resolute Onyx DES x2 to the RCA  Echocardiogram 10/13/2019 EF 60-65, normal RV SF, moderately elevated PASP (RVSP 43.9), trivial MR  Echocardiogram 08/14/2019 Small pericardial effusion without tamponade physiology, EF 55-60, moderate LVH, RVSP 32.9  Echocardiogram 08/09/2019 EF 65-70  History of Present Illness:    Melanie Hall was admitted 4/11-4/15 with a late presentation inferior STEMI.  Cardiac catheterization demonstrated severe single-vessel CAD with critical stenosis of the distal RCA which was treated successfully with overlapping DES x2.  LAD, diagonal branches, left main and small LCx were all patent without significant stenosis.  EF was 35%.  Echocardiogram confirmed her EF is 40-45%.  She was placed on Carvedilol.  ACE/ARB/Hydralazine/Nitrates were not started due to soft BP.    She returns for follow-up.  She is here with her daughter.  She notes symptoms of shortness of breath with minimal activity.  The  symptoms predated her myocardial infarction.  They have remained fairly stable.  She has not had orthopnea, lower extremity swelling or syncope.  She has not had recurrent angina.  She does have pain around her dialysis port.  She has follow-up with nephrology this week.  She does note that she is able to get to her dry weight at dialysis.  She goes to dialysis every Monday, Wednesday, Friday.  Past Medical History:  Diagnosis Date  . Anemia   . Cataracts, bilateral    surgery to remove  . Diabetes mellitus    type 2  . ESRD on hemodialysis ([place])   . GERD (gastroesophageal reflux disease)   . Hyperlipidemia   . Hypertension   . Stroke ([place])   . SVD (spontaneous vaginal delivery)    x 4    Current Medications: Current Meds  Medication Sig  . aspirin EC 81 MG EC tablet Take 1 tablet (81 mg total) by mouth daily.  Marland Kitchen atorvastatin (LIPITOR) 80 MG tablet Take 1 tablet (80 mg total) by mouth daily at 6 PM.  . epoetin alfa (EPOGEN) 3000 UNIT/ML injection Inject 3,000 Units into the skin every 14 (fourteen) days.   Marland Kitchen gabapentin (NEURONTIN) 300 MG capsule Take 1 capsule (300 mg total) by mouth at bedtime.  . insulin aspart protamine- aspart (NOVOLOG MIX 70/30) (70-30) 100 UNIT/ML injection Inject 10 Units into the skin 2 (two) times daily with a meal.   . levothyroxine (SYNTHROID) 25 MCG tablet Take 1 tablet (25 mcg total) by mouth daily before breakfast.  .  multivitamin (RENA-VIT) TABS tablet Take 1 tablet by mouth at bedtime.  . nitroGLYCERIN (NITROSTAT) 0.4 MG SL tablet Place 1 tablet (0.4 mg total) under the tongue every 5 (five) minutes x 3 doses as needed for chest pain.  Marland Kitchen sertraline (ZOLOFT) 25 MG tablet Take 1 tablet (25 mg total) by mouth daily.  . tamsulosin (FLOMAX) 0.4 MG CAPS capsule Take 1 capsule (0.4 mg total) by mouth daily after supper.  . ticagrelor (BRILINTA) 90 MG TABS tablet Take 1 tablet (90 mg total) by mouth 2 (two) times daily.  Marland Kitchen zolpidem (AMBIEN) 5 MG tablet Take 1  tablet (5 mg total) by mouth at bedtime as needed for sleep.  . [DISCONTINUED] carvedilol (COREG) 6.25 MG tablet Take 1 tablet (6.25 mg total) by mouth 2 (two) times daily with a meal.     Allergies:   Patient has no known allergies.   Social History   Tobacco Use  . Smoking status: Former Smoker    Packs/day: 0.25    Years: 2.00    Pack years: 0.50    Types: Cigarettes    Quit date: 1997    Years since quitting: 24.3  . Smokeless tobacco: Never Used  Substance Use Topics  . Alcohol use: No  . Drug use: No     Family Hx: The patient's family history includes Bipolar disorder in her mother; Brain cancer in her maternal aunt; Breast cancer in her maternal aunt; Depression in her father; Diabetes in her maternal grandmother, mother, and sister; Heart disease in her maternal grandmother; Hypertension in her mother.  Review of Systems  Constitution: Negative for chills and fever.     EKGs/Labs/Other Test Reviewed:    EKG:  EKG is  ordered today.  The ekg ordered today demonstrates normal sinus rhythm, heart rate 84, normal axis, T wave inversions 2, 3, aVF, V4-V6, QTC 456, septal Q waves  Recent Labs: 08/08/2019: B Natriuretic Peptide 279.0 08/24/2019: Magnesium 1.8 09/10/2019: ALT 17 10/20/2019: TSH 5.115 10/21/2019: BUN 38; Creatinine, Ser 4.72; Hemoglobin 9.7; Platelets 131; Potassium 4.4; Sodium 133   Recent Lipid Panel Lab Results  Component Value Date/Time   CHOL 95 10/19/2019 12:34 AM   TRIG 136 10/19/2019 12:34 AM   HDL 28 (L) 10/19/2019 12:34 AM   CHOLHDL 3.4 10/19/2019 12:34 AM   LDLCALC 40 10/19/2019 12:34 AM    Physical Exam:    VS:  BP (!) 150/80   Ht 5\' 3"  (1.6 m)   Wt 149 lb 1.9 oz (67.6 kg)   LMP 12/17/2016   SpO2 95%   BMI 26.42 kg/m     Wt Readings from Last 3 Encounters:  11/03/19 149 lb 1.9 oz (67.6 kg)  10/22/19 145 lb (65.8 kg)  09/18/19 154 lb 1.6 oz (69.9 kg)     Constitutional:      Appearance: Healthy appearance. Not in distress.    Neck:     Thyroid: No thyromegaly.     Vascular: JVD normal.  Pulmonary:     Breath sounds: No wheezing. Bibasilar Rales present.     Comments: Decreased breath sounds at the bases bilat (R>L) Cardiovascular:     Normal rate. Regular rhythm.     Murmurs: There is no murmur.  Pulses:    Comments: R FA site without hematoma or bruit Edema:    Peripheral edema absent.  Abdominal:     General: There is no distension.     Palpations: Abdomen is soft.  Skin:    General: Skin  is warm and dry.  Neurological:     General: No focal deficit present.     Mental Status: Alert and oriented to person, place and time.       ASSESSMENT & PLAN:    1. ST elevation myocardial infarction involving right coronary artery ([place]) She is status post recent inferior ST elevation myocardial infarction treated with DES x2 to the RCA.  She had no significant disease elsewhere.  She has not had recurrent angina.  We discussed the importance of uninterrupted dual antiplatelet therapy for 12 months post MI.  Continue aspirin, Ticagrelor, Atorvastatin, carvedilol.  2. Chronic systolic CHF (congestive heart failure) ([place]) 3. Pleural effusion, left 4. Hypoxia EF 40-45 by echocardiogram post MI.  NYHA III.  Echocardiogram in the hospital demonstrated evidence of a large left pleural effusion.  We ambulated the patient in the office today and her oxygen at rest on room air was 91%.  It dropped to 86% on room air.  On O2 at 2 L/min, her oxygen was 94% at rest increased to 97% while ambulating.  I suspect her hypoxia is related to her pleural effusion.  I will arrange a chest x-ray to reassess her effusion.  If this is large enough, I will refer her to interventional radiology at [facility] for thoracentesis.  I will also arrange home O2 via the home health agency.  Her heart rate could certainly be lower and I will increase her carvedilol to 9.375 mg twice daily.  Her blood pressure can likely tolerate the addition of  hydralazine +/- nitrates.  I will consider this at follow-up.  Return for follow-up in 3 weeks.  5. ESRD (end stage renal disease) on dialysis [facility]) She remains on Monday, Wednesday, Friday dialysis.  6. Type 2 diabetes with nephropathy ([place]) Continue follow-up with primary care/endocrinology for management.  7. Mixed hyperlipidemia Continue high intensity statin therapy.  Arrange fasting lipids and LFTs in 6 weeks.    Dispo:  Return in about 3 weeks (around 11/24/2019) for Close Follow Up, w/ Dr. Burt Knack, or Richardson Dopp, PA-C, in person.   Medication Adjustments/Labs and Tests Ordered: Current medicines are reviewed at length with the patient today.  Concerns regarding medicines are outlined above.  Tests Ordered: Orders Placed This Encounter  Procedures  . For home use only DME oxygen  . DG Chest 2 View  . Hepatic function panel  . Lipid panel  . EKG 12-Lead   Medication Changes: Meds ordered this encounter  Medications  . carvedilol (COREG) 6.25 MG tablet    Sig: Take 1.5 tablets (9.375 mg total) by mouth 2 (two) times daily.    Dispense:  180 tablet    Refill:  3    Signed, Richardson Dopp, PA-C  11/03/2019 5:33 PM    [facility] [address], [place], [place]  [address] Phone: [phone]; Fax: [phone]

## 2019-11-03 NOTE — Patient Instructions (Signed)
Medication Instructions:  Your physician has recommended you make the following change in your medication:  1. START COREG 9.375 MG TWICE DAILY.  *If you need a refill on your cardiac medications before your next appointment, please call your pharmacy*   Lab Work:  TO BE DONE IN 6 WEEK: LFTS, LIPIDS If you have labs (blood work) drawn today and your tests are completely normal, you will receive your results only by: Marland Kitchen MyChart Message (if you have MyChart) OR . A paper copy in the mail If you have any lab test that is abnormal or we need to change your treatment, we will call you to review the results.   Testing/Procedures: A chest x-ray takes a picture of the organs and structures inside the chest, including the heart, lungs, and blood vessels. This test can show several things, including, whether the heart is enlarges; whether fluid is building up in the lungs; and whether pacemaker / defibrillator leads are still in place.   Follow-Up: At [facility], you and your health needs are our priority.  As part of our continuing mission to provide you with exceptional heart care, we have created designated Provider Care Teams.  These Care Teams include your primary Cardiologist (physician) and Advanced Practice Providers (APPs -  Physician Assistants and Nurse Practitioners) who all work together to provide you with the care you need, when you need it.  We recommend signing up for the patient portal called "MyChart".  Sign up information is provided on this After Visit Summary.  MyChart is used to connect with patients for Virtual Visits (Telemedicine).  Patients are able to view lab/test results, encounter notes, upcoming appointments, etc.  Non-urgent messages can be sent to your provider as well.   To learn more about what you can do with MyChart, go to [email or web address].    Your next appointment:   3 week(s)  The format for your next appointment:   In Person  Provider:    You  may see Sherren Mocha, MD  or Richardson Dopp, PA-C     Other Instructions  YOU WILL BE ON HOME OXYGEN AND SOMEONE WITH CALL YOU TO GET IT SET UP

## 2019-11-04 ENCOUNTER — Encounter (HOSPITAL_COMMUNITY): Payer: Self-pay | Admitting: *Deleted

## 2019-11-04 ENCOUNTER — Emergency Department (HOSPITAL_COMMUNITY): Payer: Medicaid Other

## 2019-11-04 ENCOUNTER — Emergency Department (HOSPITAL_COMMUNITY)
Admission: EM | Admit: 2019-11-04 | Discharge: 2019-11-04 | Disposition: A | Discharge status: Home / Self Care | Payer: Medicaid Other | Attending: Emergency Medicine | Admitting: Emergency Medicine

## 2019-11-04 ENCOUNTER — Other Ambulatory Visit: Payer: Self-pay | Admitting: *Deleted

## 2019-11-04 ENCOUNTER — Telehealth: Payer: Self-pay | Admitting: *Deleted

## 2019-11-04 ENCOUNTER — Ambulatory Visit: Payer: Self-pay | Admitting: Urology

## 2019-11-04 DIAGNOSIS — Z7982 Long term (current) use of aspirin: Secondary | ICD-10-CM | POA: Diagnosis not present

## 2019-11-04 DIAGNOSIS — J189 Pneumonia, unspecified organism: Secondary | ICD-10-CM | POA: Insufficient documentation

## 2019-11-04 DIAGNOSIS — S0990XA Unspecified injury of head, initial encounter: Secondary | ICD-10-CM | POA: Diagnosis not present

## 2019-11-04 DIAGNOSIS — Y929 Unspecified place or not applicable: Secondary | ICD-10-CM | POA: Insufficient documentation

## 2019-11-04 DIAGNOSIS — Z992 Dependence on renal dialysis: Secondary | ICD-10-CM | POA: Insufficient documentation

## 2019-11-04 DIAGNOSIS — Z87891 Personal history of nicotine dependence: Secondary | ICD-10-CM | POA: Diagnosis not present

## 2019-11-04 DIAGNOSIS — W109XXA Fall (on) (from) unspecified stairs and steps, initial encounter: Secondary | ICD-10-CM | POA: Diagnosis not present

## 2019-11-04 DIAGNOSIS — I120 Hypertensive chronic kidney disease with stage 5 chronic kidney disease or end stage renal disease: Secondary | ICD-10-CM | POA: Diagnosis not present

## 2019-11-04 DIAGNOSIS — Z79899 Other long term (current) drug therapy: Secondary | ICD-10-CM | POA: Diagnosis not present

## 2019-11-04 DIAGNOSIS — N186 End stage renal disease: Secondary | ICD-10-CM | POA: Insufficient documentation

## 2019-11-04 DIAGNOSIS — J181 Lobar pneumonia, unspecified organism: Secondary | ICD-10-CM | POA: Diagnosis not present

## 2019-11-04 DIAGNOSIS — J90 Pleural effusion, not elsewhere classified: Secondary | ICD-10-CM

## 2019-11-04 DIAGNOSIS — S022XXA Fracture of nasal bones, initial encounter for closed fracture: Secondary | ICD-10-CM | POA: Diagnosis not present

## 2019-11-04 DIAGNOSIS — R040 Epistaxis: Secondary | ICD-10-CM | POA: Diagnosis not present

## 2019-11-04 DIAGNOSIS — I252 Old myocardial infarction: Secondary | ICD-10-CM | POA: Diagnosis not present

## 2019-11-04 DIAGNOSIS — Y999 Unspecified external cause status: Secondary | ICD-10-CM | POA: Diagnosis not present

## 2019-11-04 DIAGNOSIS — Z7901 Long term (current) use of anticoagulants: Secondary | ICD-10-CM | POA: Diagnosis not present

## 2019-11-04 DIAGNOSIS — Y939 Activity, unspecified: Secondary | ICD-10-CM | POA: Insufficient documentation

## 2019-11-04 DIAGNOSIS — W19XXXA Unspecified fall, initial encounter: Secondary | ICD-10-CM

## 2019-11-04 DIAGNOSIS — R519 Headache, unspecified: Secondary | ICD-10-CM | POA: Diagnosis not present

## 2019-11-04 LAB — BASIC METABOLIC PANEL
Anion gap: 13 (ref 5–15)
BUN: 39 mg/dL — ABNORMAL HIGH (ref 6–20)
CO2: 21 mmol/L — ABNORMAL LOW (ref 22–32)
Calcium: 8.7 mg/dL — ABNORMAL LOW (ref 8.9–10.3)
Chloride: 102 mmol/L (ref 98–111)
Creatinine, Ser: 4.93 mg/dL — ABNORMAL HIGH (ref 0.44–1.00)
GFR calc Af Amer: 11 mL/min — ABNORMAL LOW (ref >60)
GFR calc non Af Amer: 10 mL/min — ABNORMAL LOW (ref >60)
Glucose, Bld: 81 mg/dL (ref 70–99)
Potassium: 4.9 mmol/L (ref 3.5–5.1)
Sodium: 136 mmol/L (ref 135–145)

## 2019-11-04 IMAGING — CT CT CERVICAL SPINE W/O CM
3 of 4 series · 12 of 35 positions shown, 14 images · non-contrast
Comparison: None.

CLINICAL DATA: Head trauma

EXAM:
CT CERVICAL SPINE WITHOUT CONTRAST
TECHNIQUE: Multidetector CT imaging of the cervical spine was performed without
intravenous contrast. Multiplanar CT image reconstructions were also
generated.

[Series 8: sag bone · sagittal · 0.23mm/px · 5 of 73 slices shown, 6 images]
[im 25/73  bone]
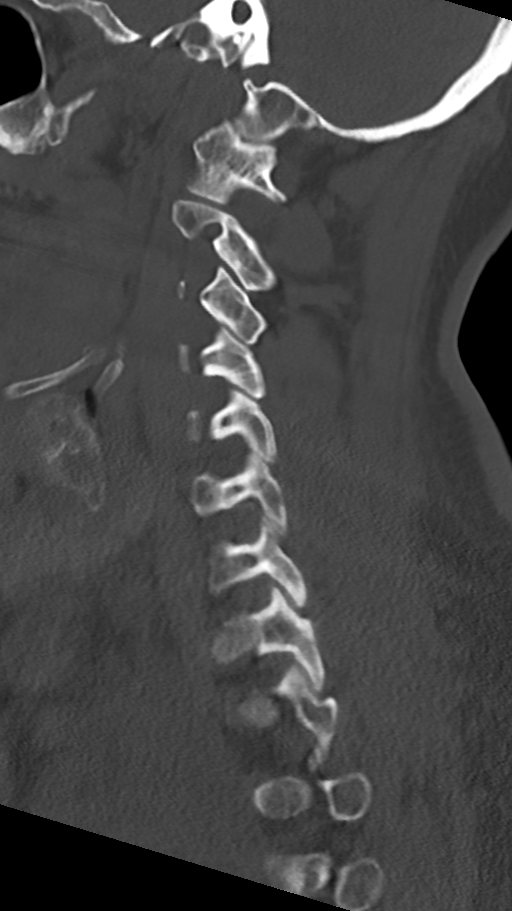
[im 31/73  bone]
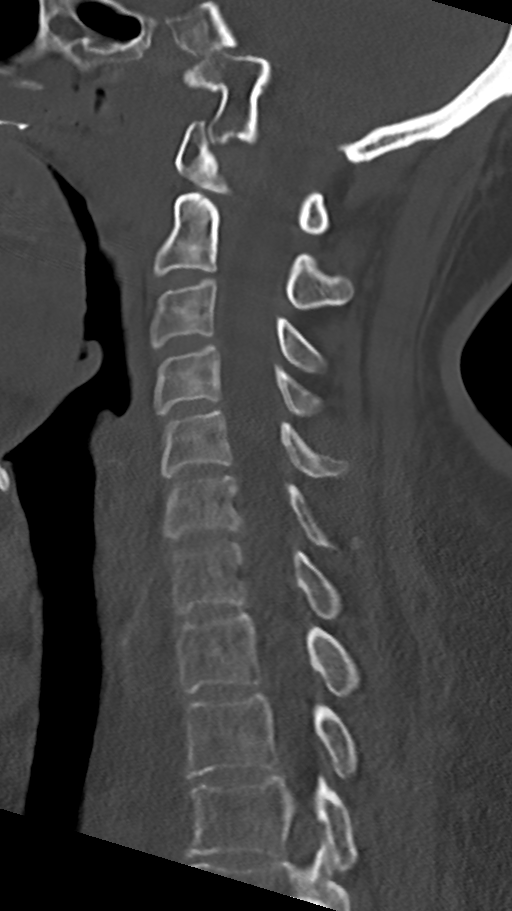
[im 37/73  soft-tissue]
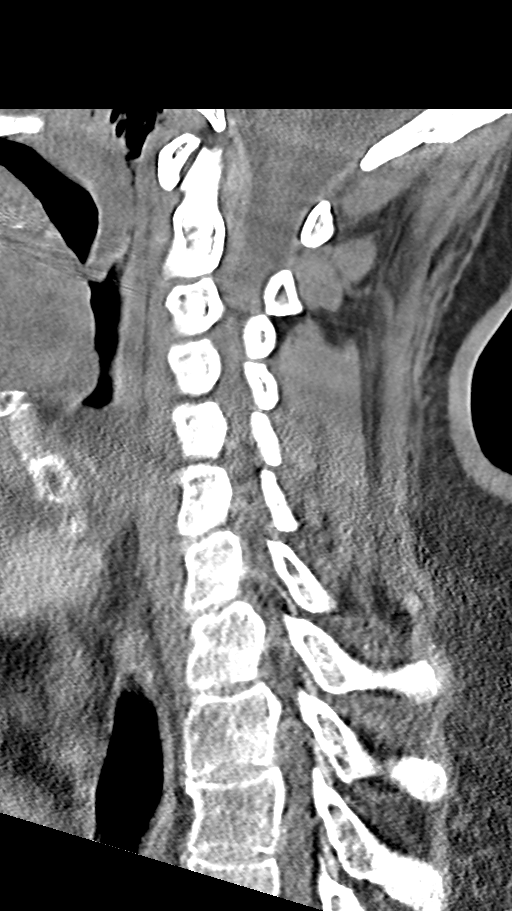
[im 37/73  bone]
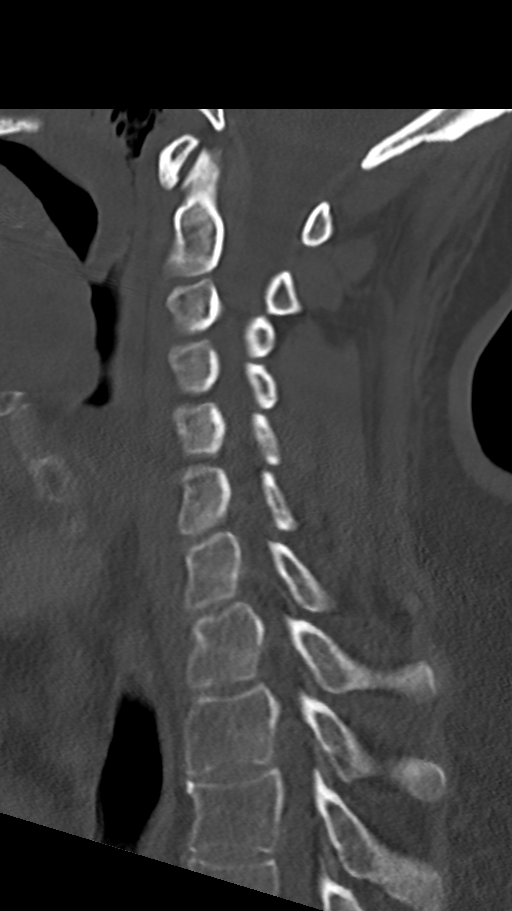
[im 43/73  bone]
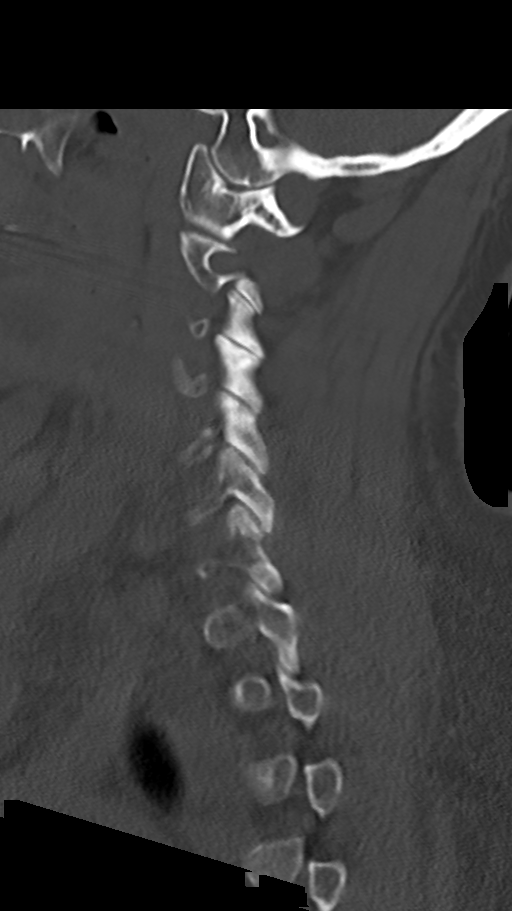
[im 49/73  bone]
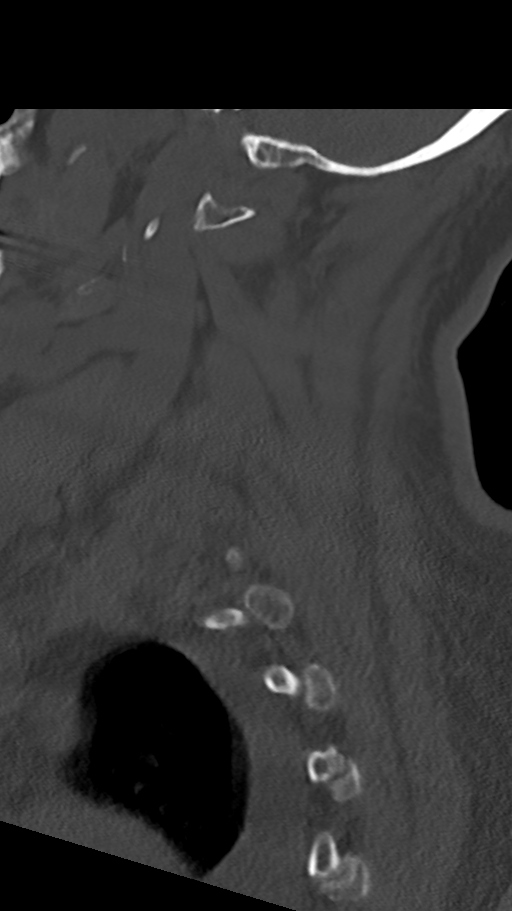

[Series 9: cor bone · coronal · 0.30mm/px · 3 of 59 slices shown]
[im 12/59  bone]
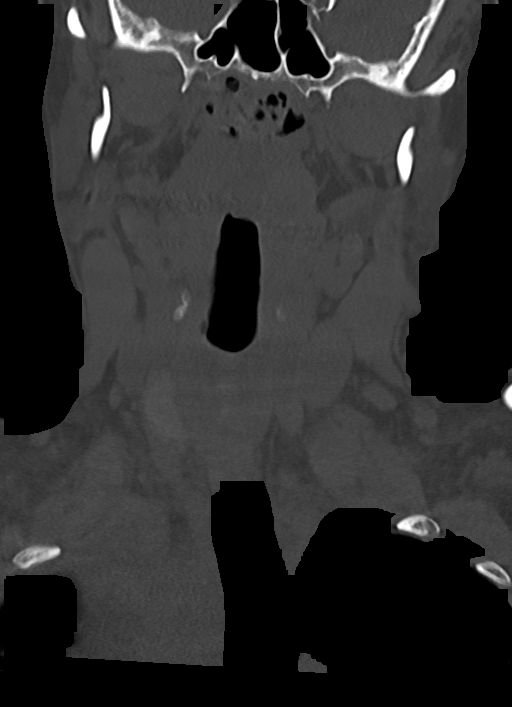
[im 24/59  bone]
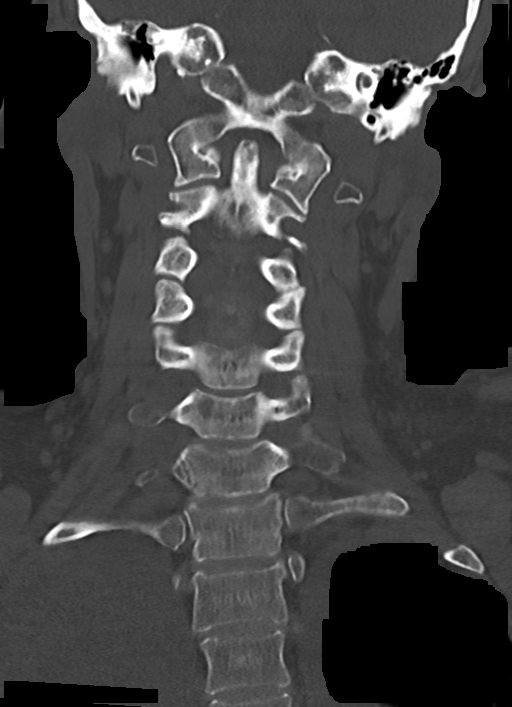
[im 35/59  bone]
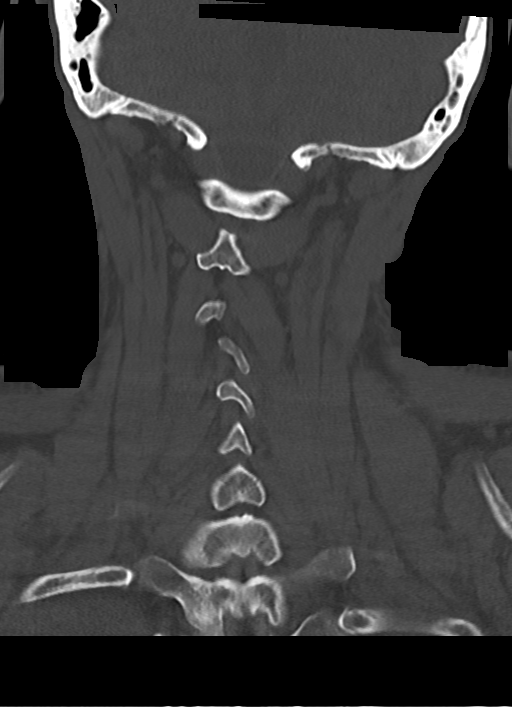

[Series 10: orthogonal axials · axial · 0.21mm/px · z∈[+1240,+1364]mm · 4 of 96 slices shown, 5 images]
[im 16/96  soft-tissue]
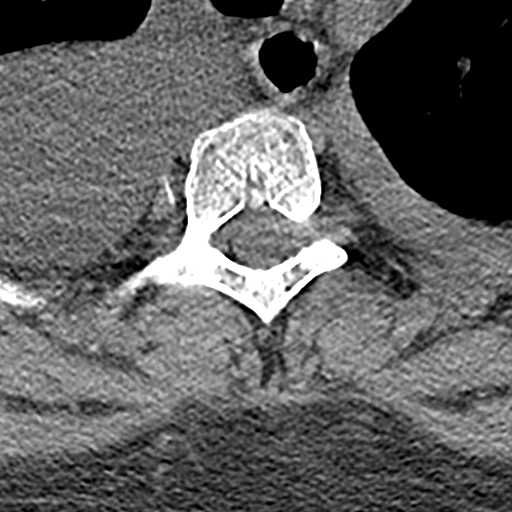
[im 16/96  bone]
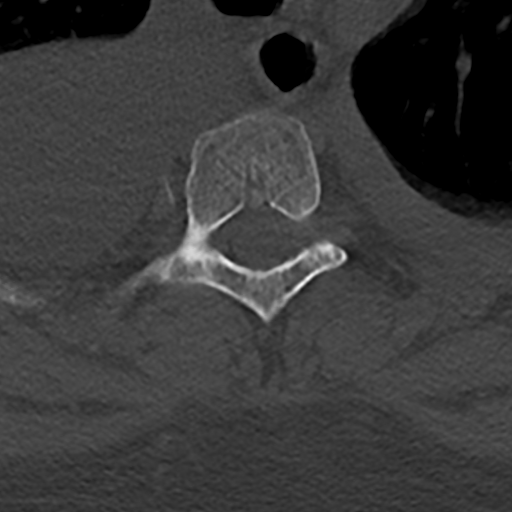
[im 32/96  bone]
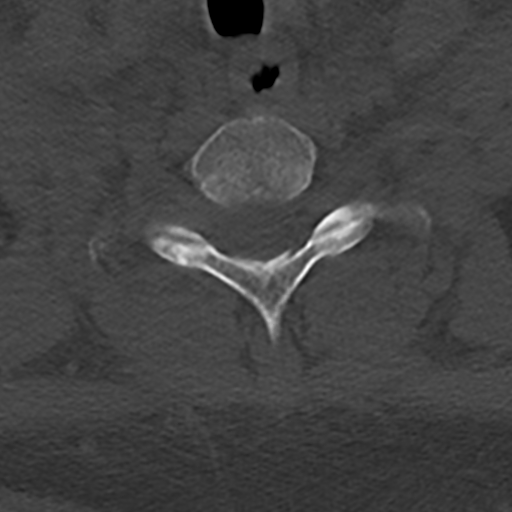
[im 64/96  bone]
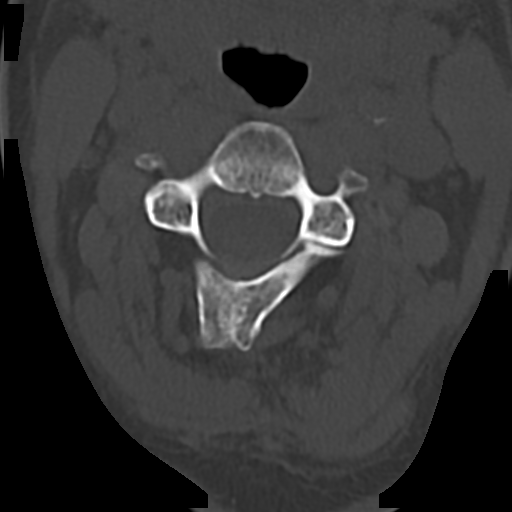
[im 80/96  bone]
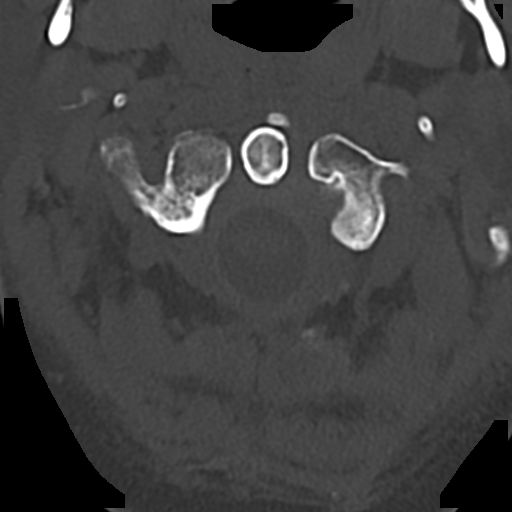

[12 of 35 positions shown; findings below may reference images not displayed]

FINDINGS: Alignment: Anteroposterior alignment is maintained

Skull base and vertebrae: No acute cervical spine fracture.
Vertebral body heights are stable.

Soft tissues and spinal canal: No prevertebral fluid or swelling. No
visible canal hematoma.

Disc levels:  No significant stenosis.

Upper chest: Right greater than left pleural effusions. Refer to
dedicated chest imaging.

Other: None
IMPRESSION: No acute cervical spine fracture.

## 2019-11-04 IMAGING — CT CT HEAD W/O CM
4 series · 17 of 47 positions shown, 19 images · non-contrast
Comparison: [DATE]

CLINICAL DATA: Head trauma, headache

EXAM:
CT HEAD WITHOUT CONTRAST
TECHNIQUE: Contiguous axial images were obtained from the base of the skull
through the vertex without intravenous contrast.

[Series 3: head wo · axial · 0.44mm/px · z∈[+1390,+1510]mm · 7 of 34 slices shown, 9 images]
[im 5/34  brain]
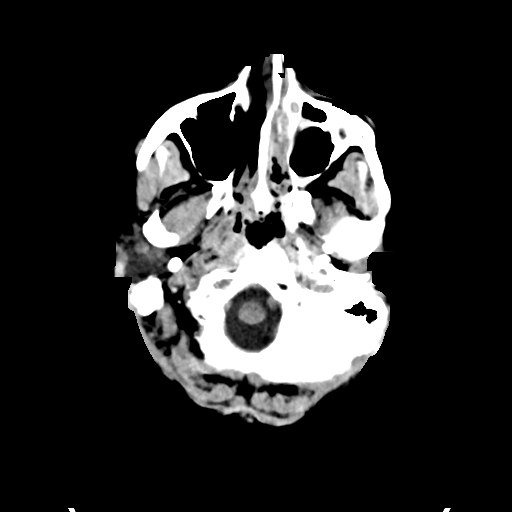
[im 5/34  bone]
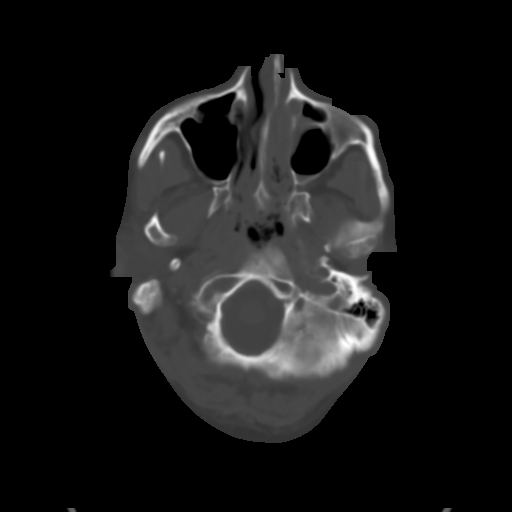
[im 9/34  brain]
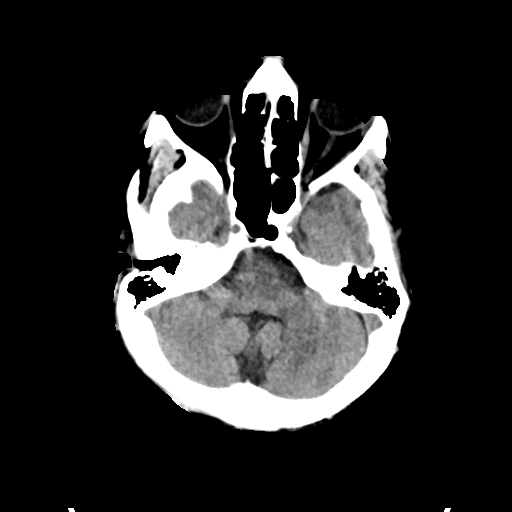
[im 13/34  brain]
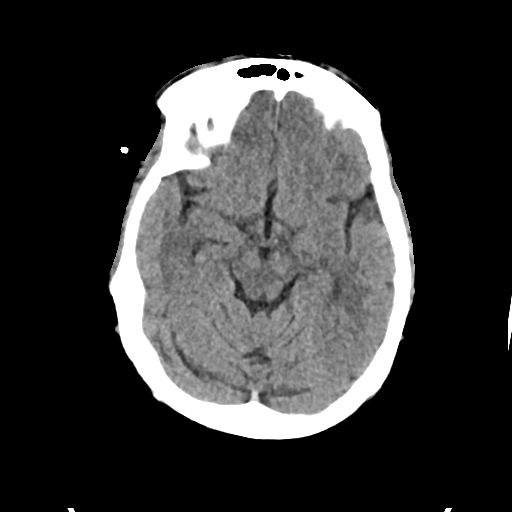
[im 17/34  brain]
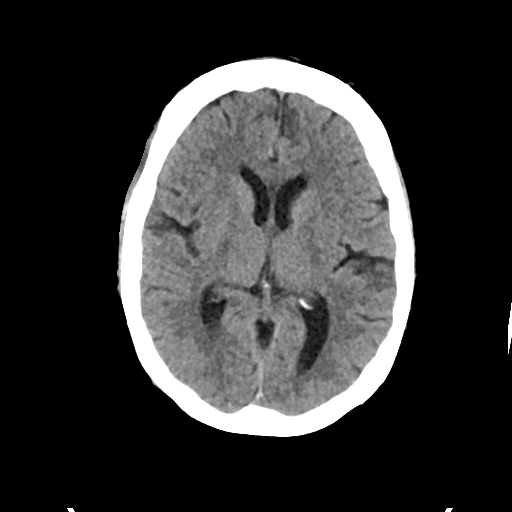
[im 21/34  brain]
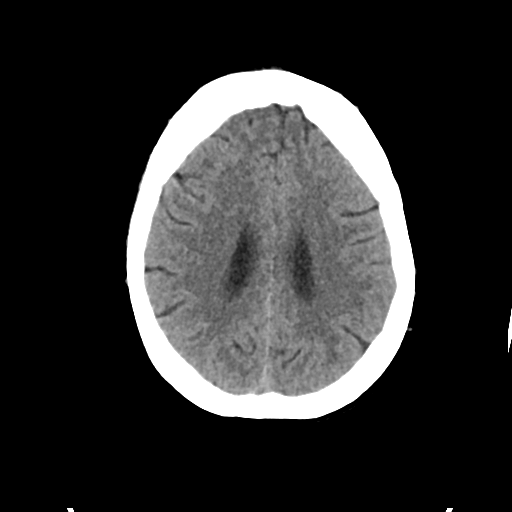
[im 21/34  bone]
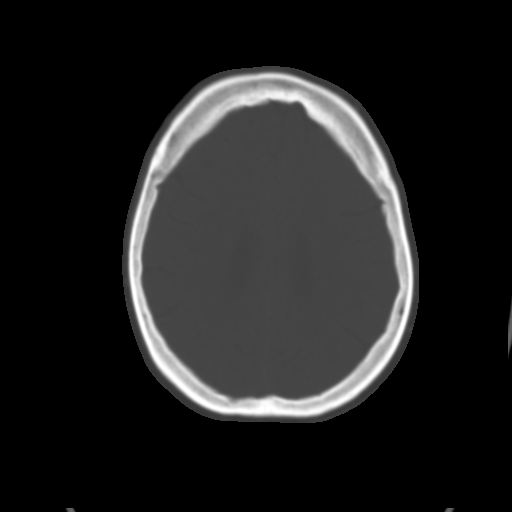
[im 25/34  brain]
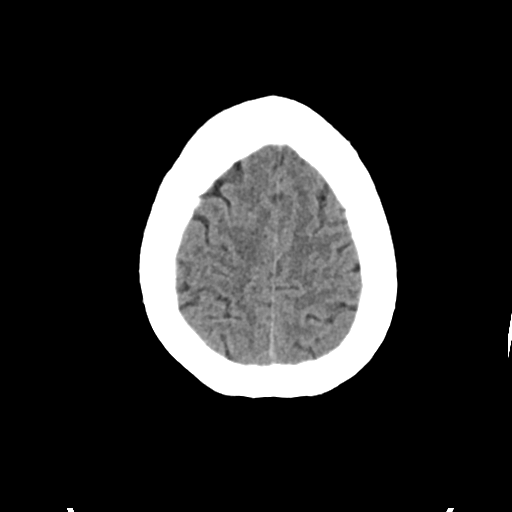
[im 29/34  brain]
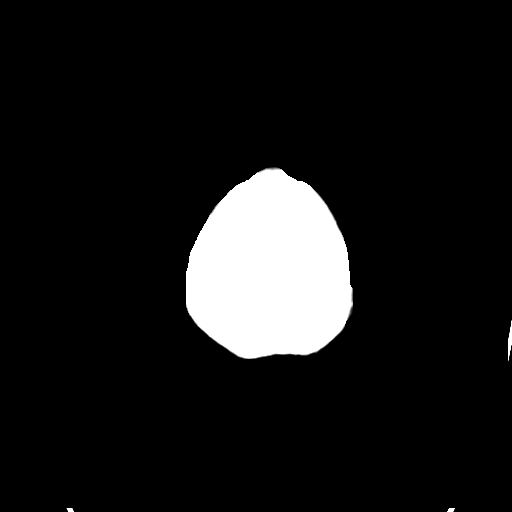

[Series 4: head bone · axial · 0.44mm/px · z∈[+1386,+1444]mm · 4 of 84 slices shown]
[im 9/84  bone]
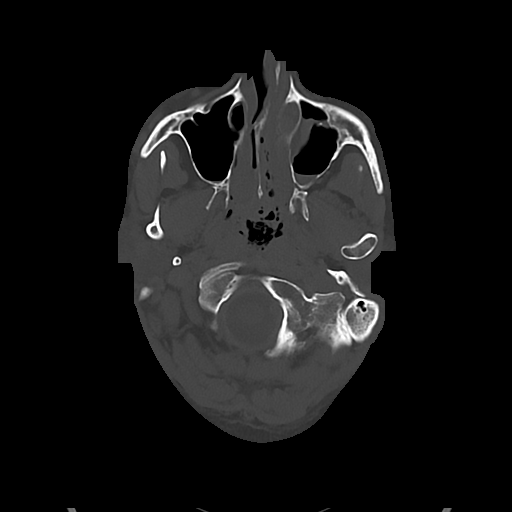
[im 17/84  bone]
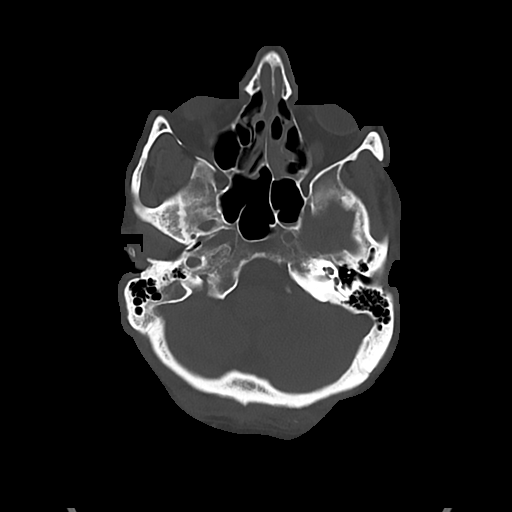
[im 25/84  bone]
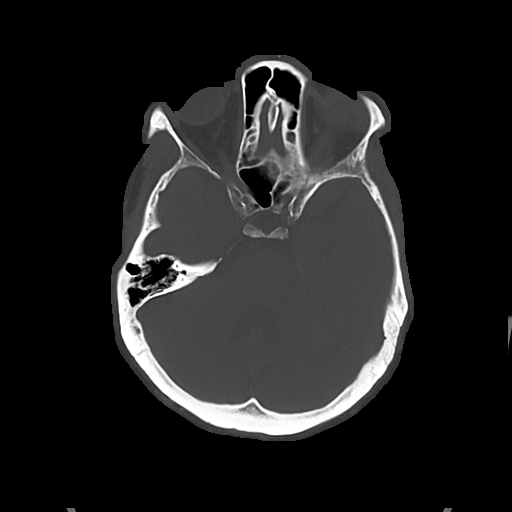
[im 38/84  bone]
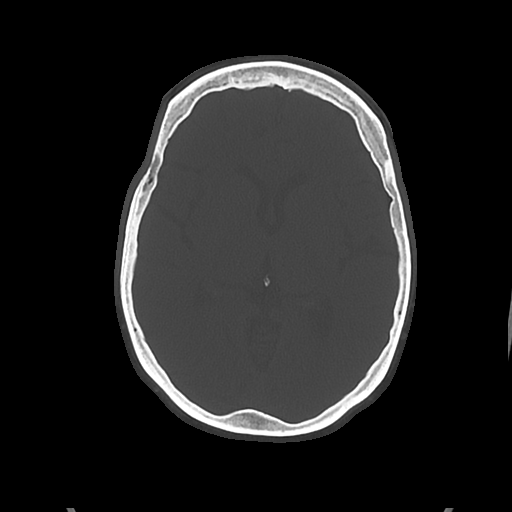

[Series 5: cor soft · coronal · 0.31mm/px · 3 of 66 slices shown]
[im 22/66  brain]
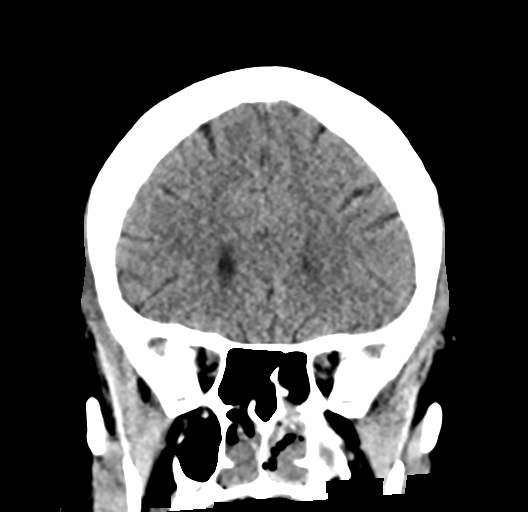
[im 29/66  brain]
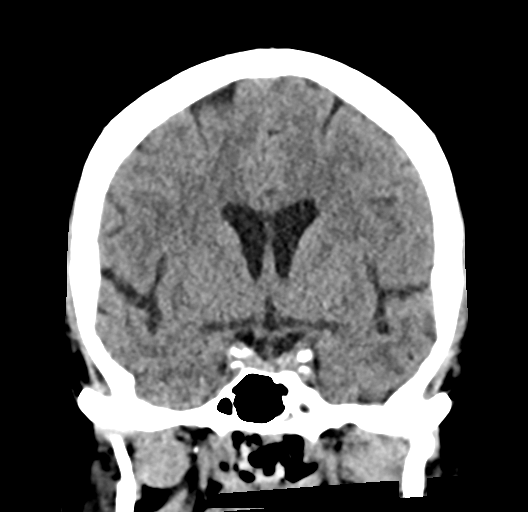
[im 37/66  brain]
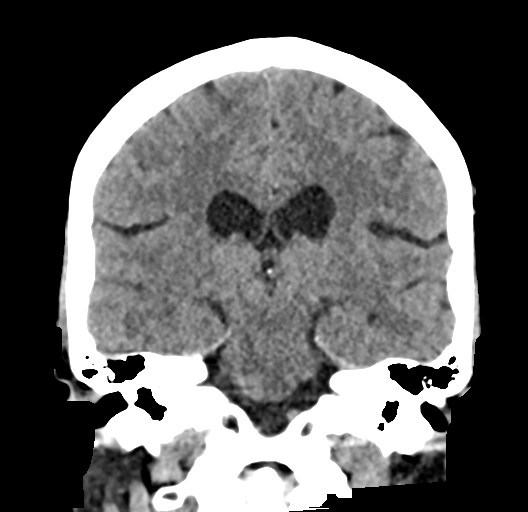

[Series 6: sag soft · sagittal · 0.33mm/px · 3 of 53 slices shown]
[im 18/53  brain]
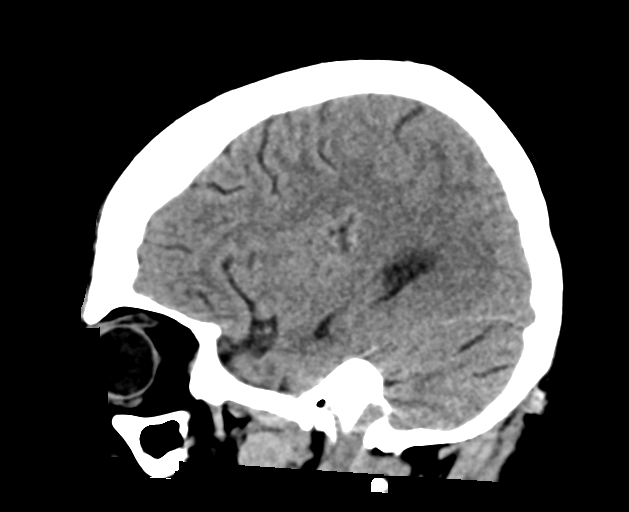
[im 27/53  brain]
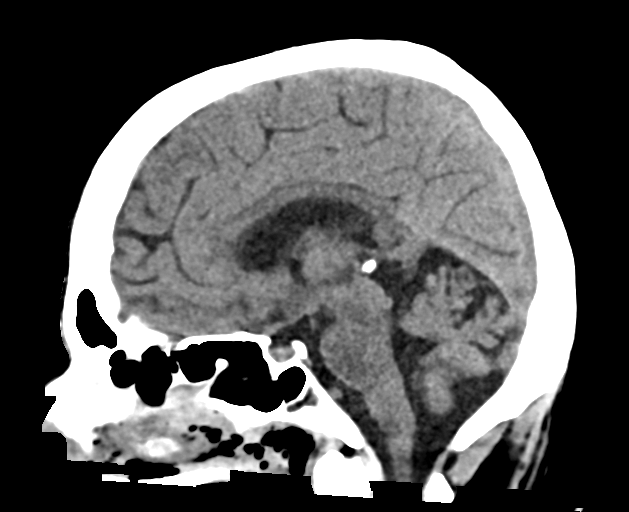
[im 35/53  brain]
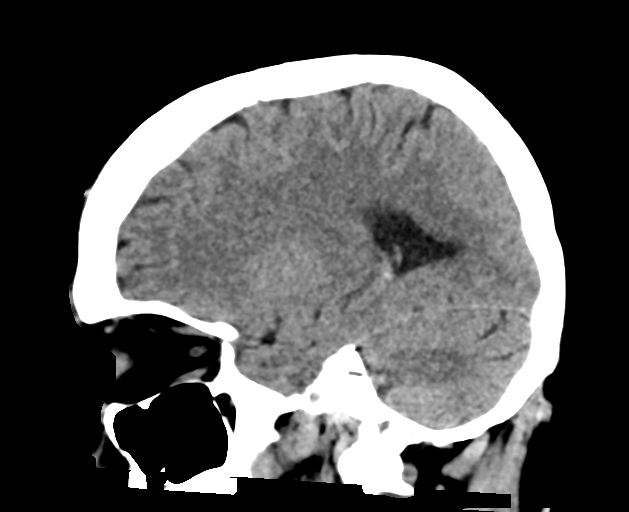

[17 of 47 positions shown; findings below may reference images not displayed]

FINDINGS: Brain: There is no acute intracranial hemorrhage, mass effect, or
edema. Gray-white differentiation is preserved. There is no
extra-axial fluid collection. Ventricles and sulci are within normal
limits in size and configuration. Minimal patchy hypoattenuation the
supratentorial white matter is nonspecific.

Vascular: There is atherosclerotic calcification at the skull base.

Skull: Calvarium is unremarkable.

Sinuses/Orbits: Refer to maxillofacial CT.

Other: Mastoid air cells are clear.
IMPRESSION: No evidence of acute intracranial injury.

## 2019-11-04 IMAGING — CT CT MAXILLOFACIAL W/O CM
3 of 6 series · 16 of 47 positions shown, 19 images · non-contrast
Comparison: None.

CLINICAL DATA: Head trauma

EXAM:
CT MAXILLOFACIAL WITHOUT CONTRAST
TECHNIQUE: Multidetector CT imaging of the maxillofacial structures was
performed. Multiplanar CT image reconstructions were also generated.

[Series 3: maxilllofacial 2.0 hr40 3 · axial · 0.36mm/px · z∈[+1295,+1437]mm · 11 of 83 slices shown, 14 images]
[im 6/83  brain]
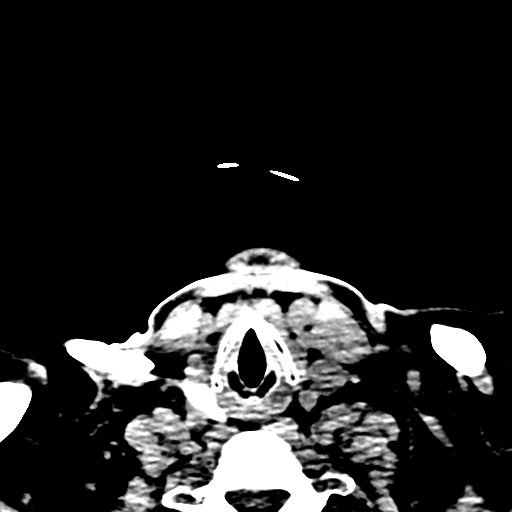
[im 6/83  bone]
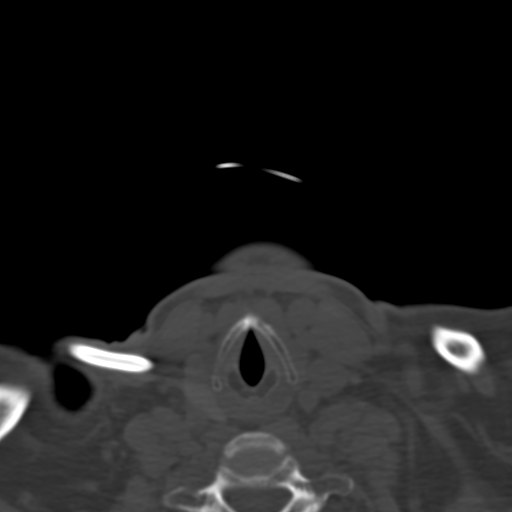
[im 12/83  bone]
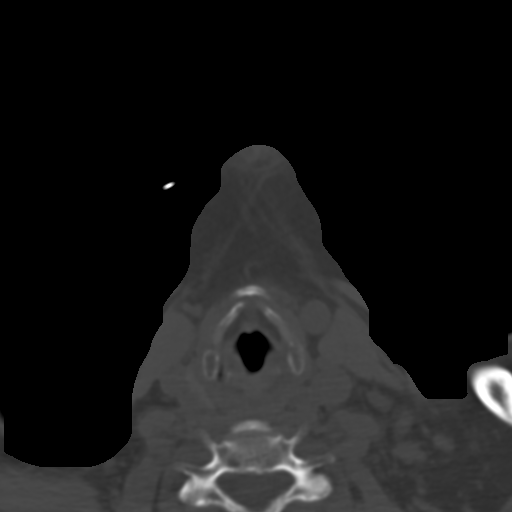
[im 18/83  bone]
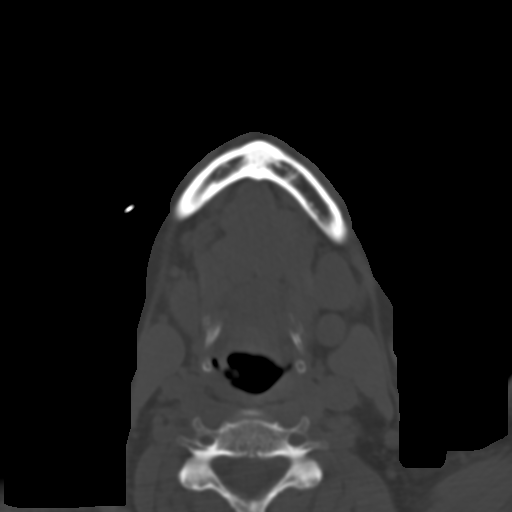
[im 30/83  bone]
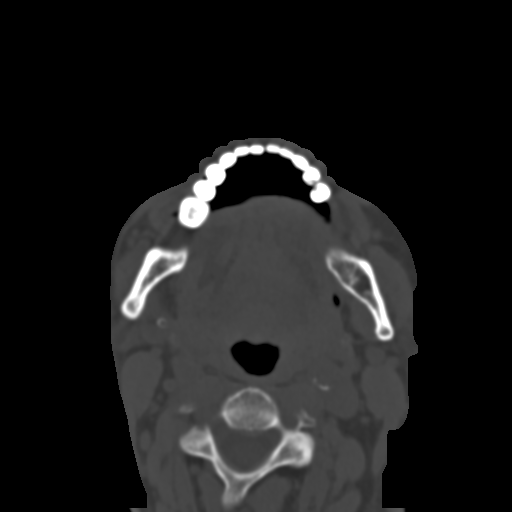
[im 36/83  brain]
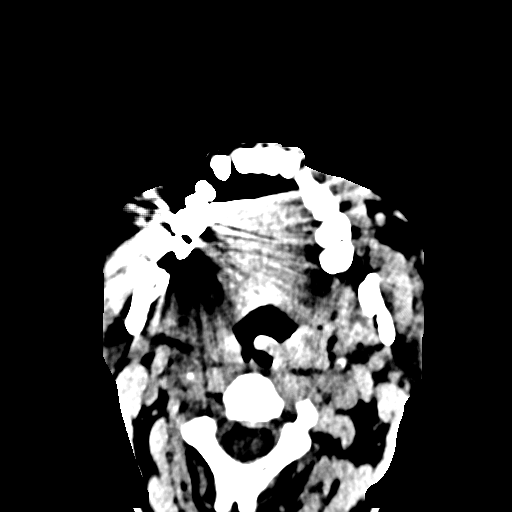
[im 36/83  bone]
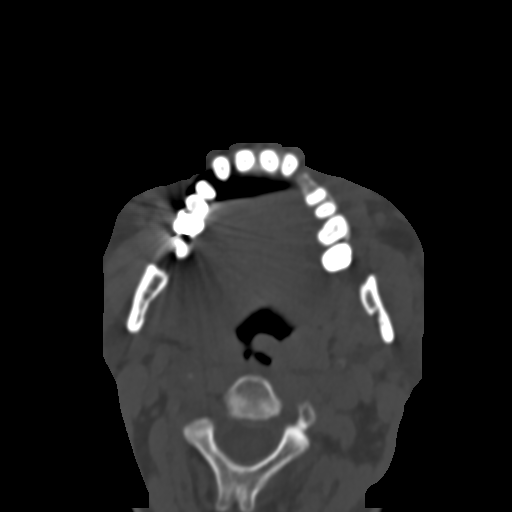
[im 42/83  bone]
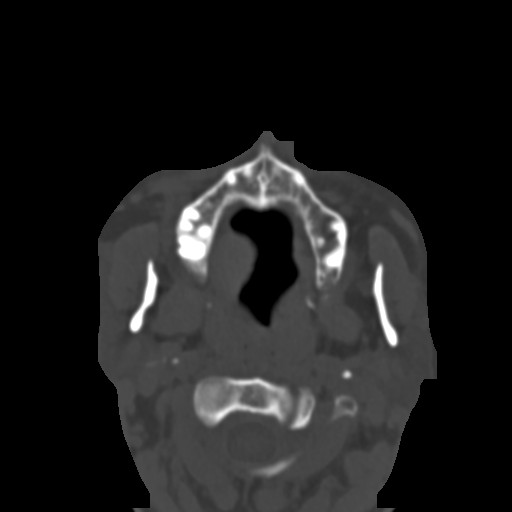
[im 47/83  bone]
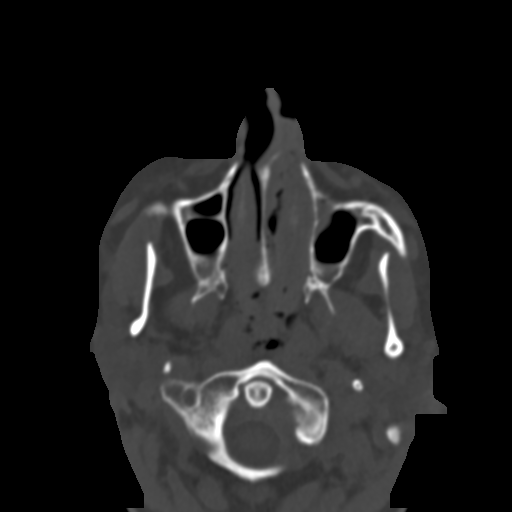
[im 53/83  bone]
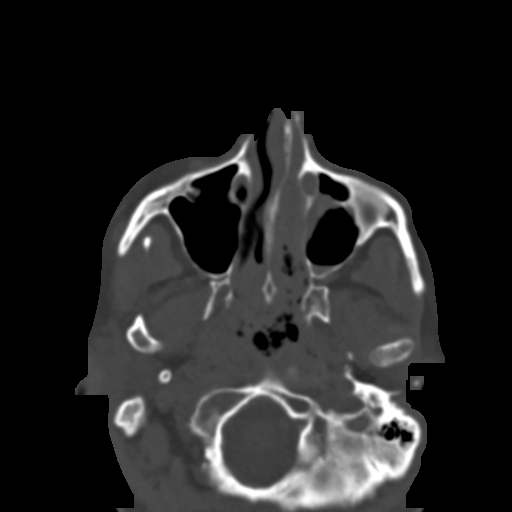
[im 65/83  brain]
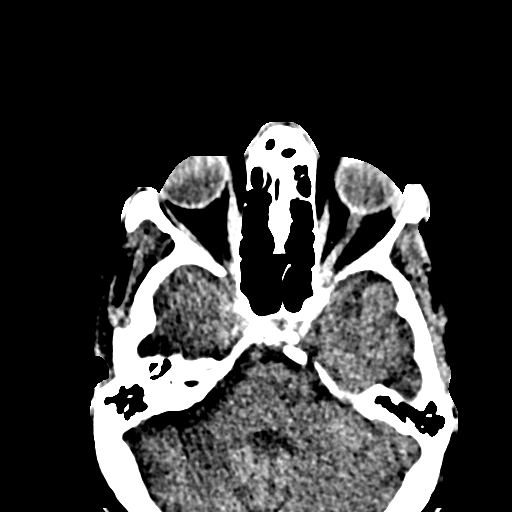
[im 65/83  bone]
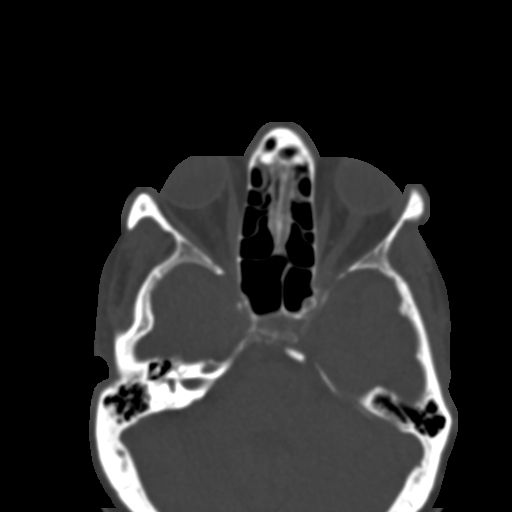
[im 71/83  bone]
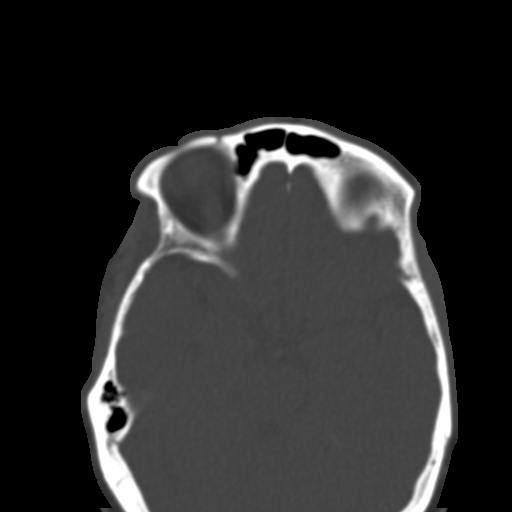
[im 77/83  bone]
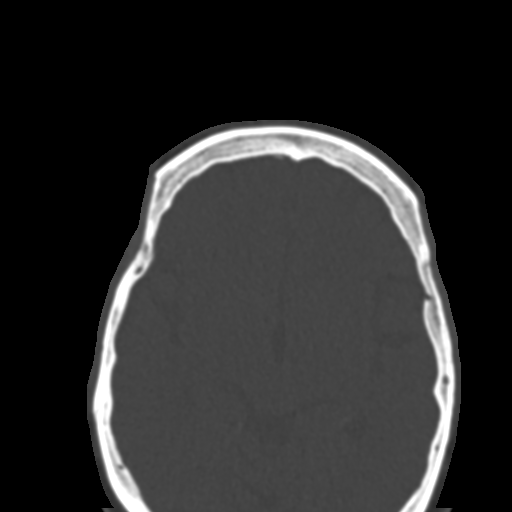

[Series 9: bone cor · coronal · 0.31mm/px · 3 of 79 slices shown]
[im 20/79  bone]
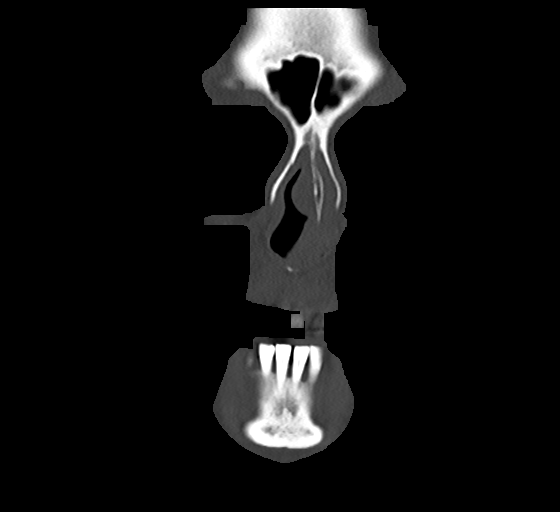
[im 40/79  bone]
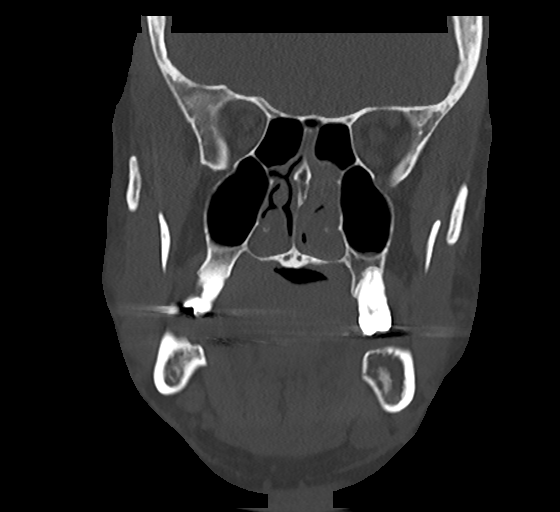
[im 59/79  bone]
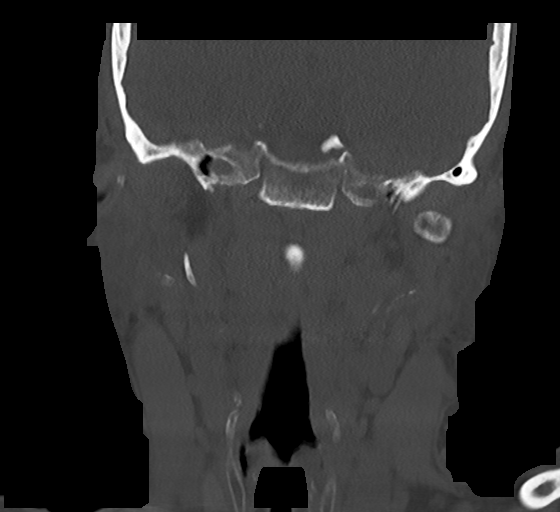

[Series 10: bone sag · sagittal · 0.28mm/px · 2 of 93 slices shown]
[im 31/93  bone]
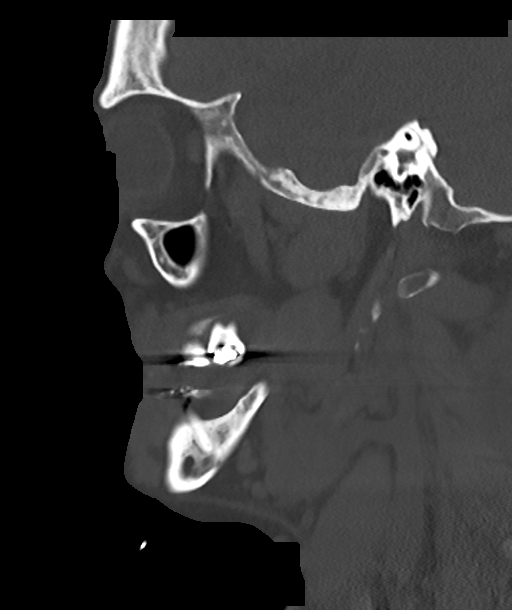
[im 62/93  bone]
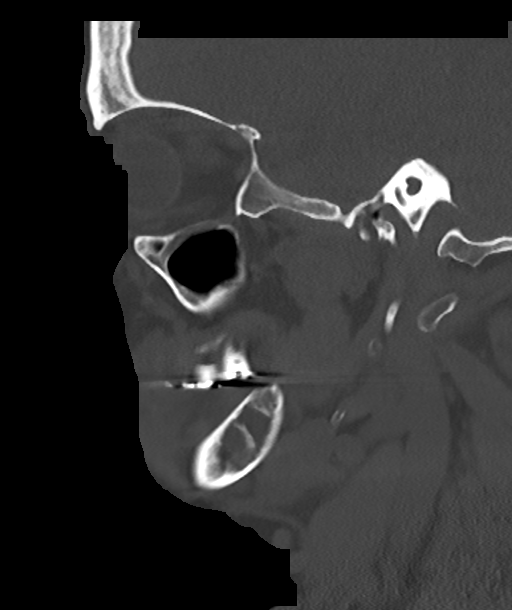

[16 of 47 positions shown; findings below may reference images not displayed]

FINDINGS: Osseous: No definite acute fracture. Mild deformity of the nasal
bones is age-indeterminate. There is chronic nasal septal deviation
with possible acute nondisplaced fracture.

Orbits: No intraorbital hematoma.

Sinuses: Mucosal thickening. Layering fluid in the left maxillary
sinus. Partial opacification of the nasal cavity left greater than
right. Areas of increased density likely reflect blood products.

Soft tissues: No large hematoma.

Limited intracranial: Dictated separately.
IMPRESSION: Age-indeterminate mild deformity of the nasal bones. Chronic nasal
septal deviation with potential superimposed acute nondisplaced
fracture.

Retained secretions and probable blood products within the nasal
cavity.

## 2019-11-04 IMAGING — CT CT CHEST W/O CM
2 of 4 series · 14 of 36 positions shown, 17 images · non-contrast
Comparison: Chest x-ray from the previous day.

CLINICAL DATA: Right-sided effusion with possible air-fluid level
to suggest cavitary lesion

EXAM:
CT CHEST WITHOUT CONTRAST
TECHNIQUE: Multidetector CT imaging of the chest was performed following the
standard protocol without IV contrast.

[Series 3: chest wo · axial · 0.68mm/px · z∈[+1062,+1274]mm · 11 of 126 slices shown, 14 images]
[im 10/126  mediastinal]
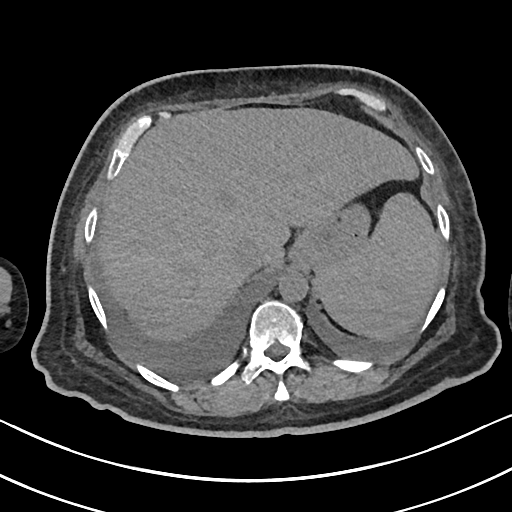
[im 10/126  lung]
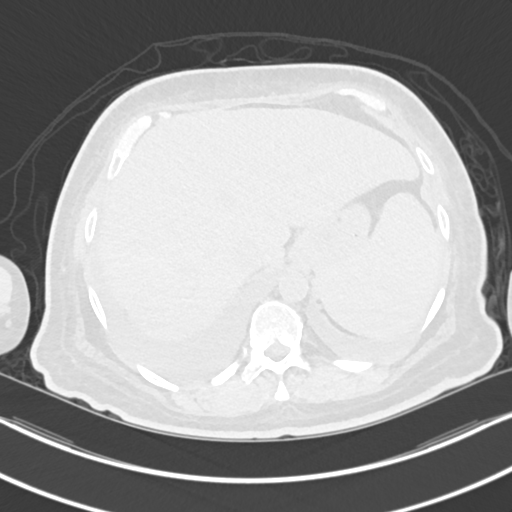
[im 20/126  lung]
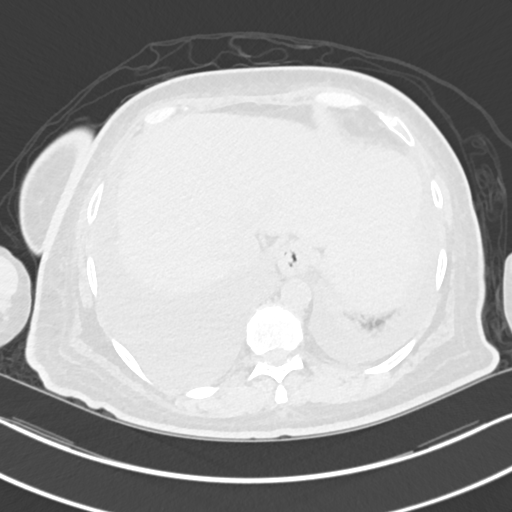
[im 29/126  lung]
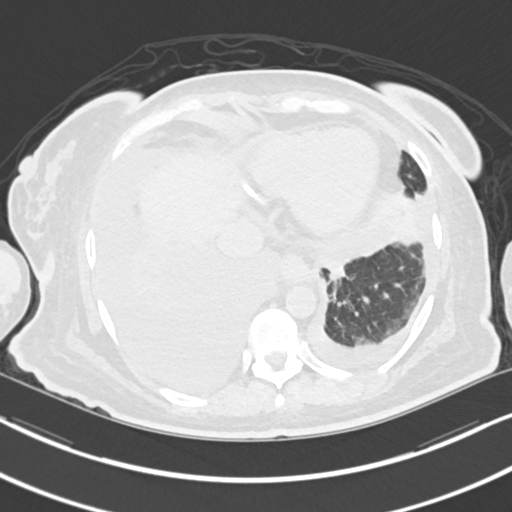
[im 39/126  lung]
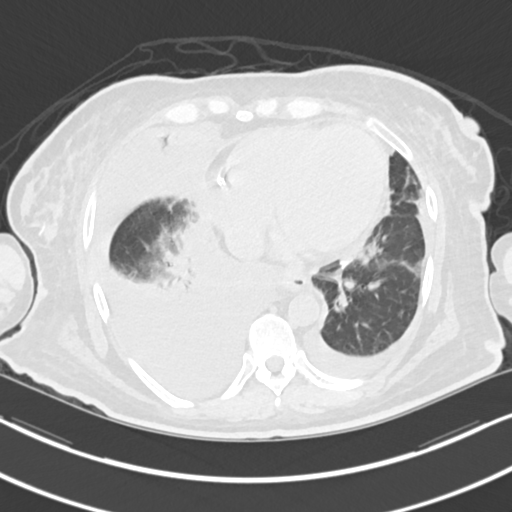
[im 49/126  mediastinal]
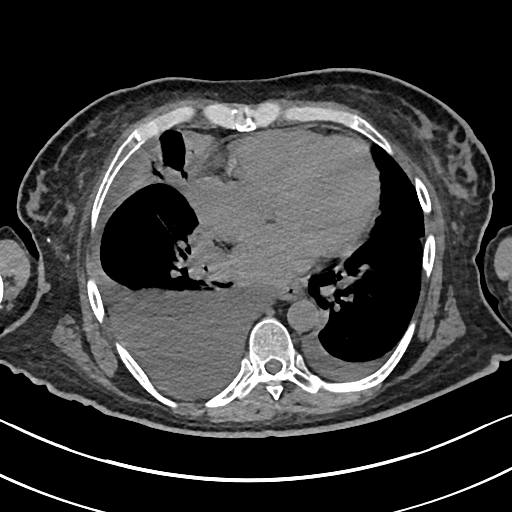
[im 49/126  lung]
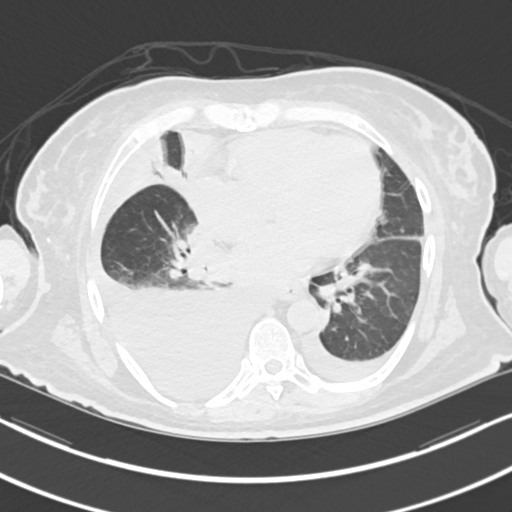
[im 68/126  lung]
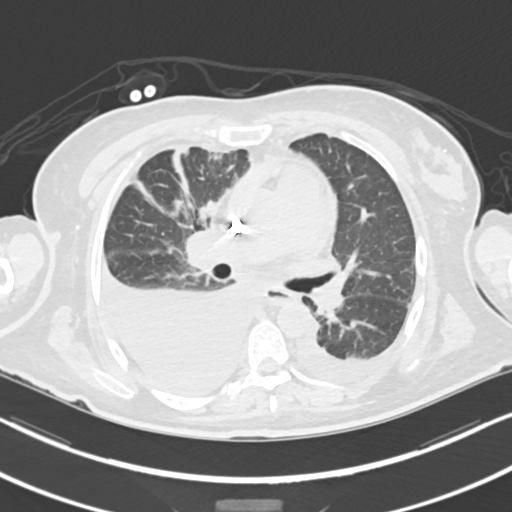
[im 77/126  lung]
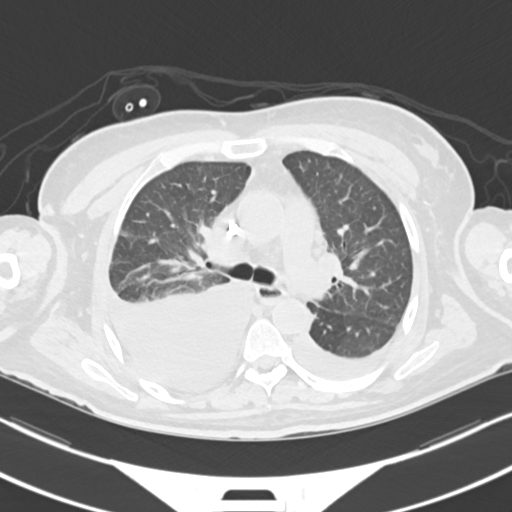
[im 87/126  lung]
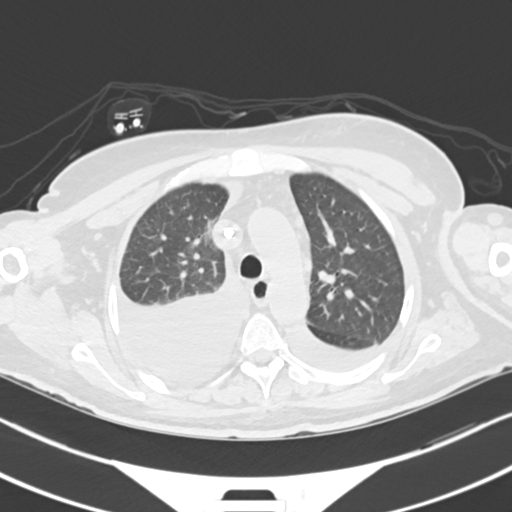
[im 97/126  mediastinal]
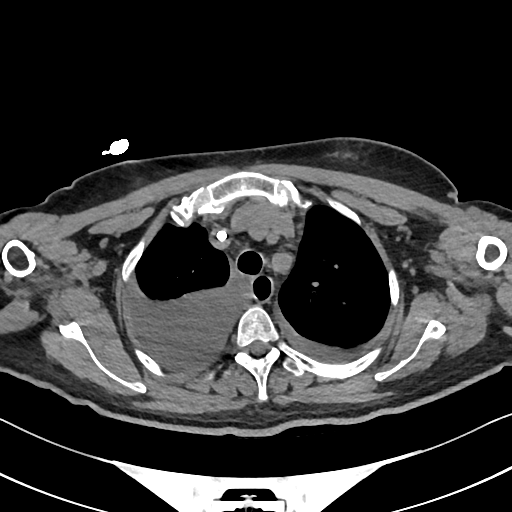
[im 97/126  lung]
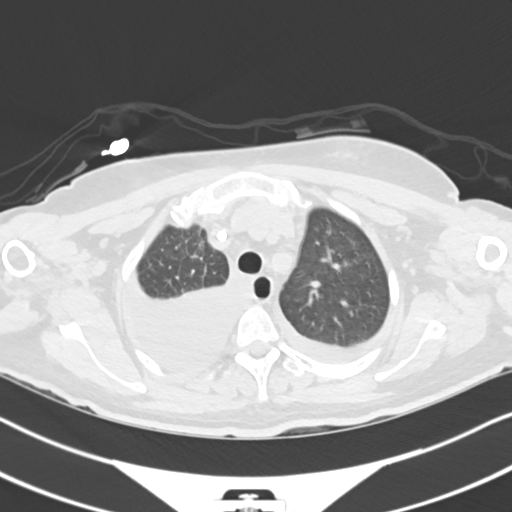
[im 106/126  lung]
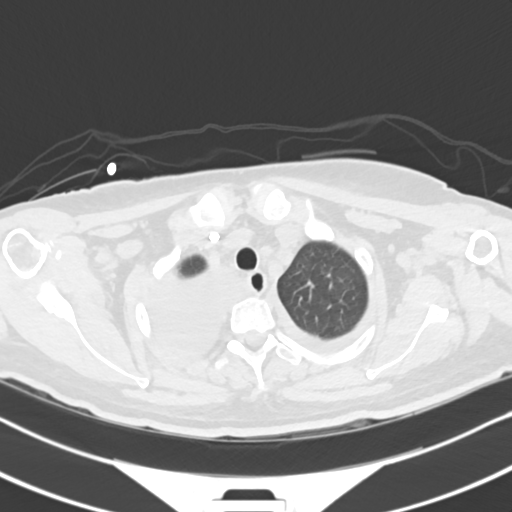
[im 116/126  lung]
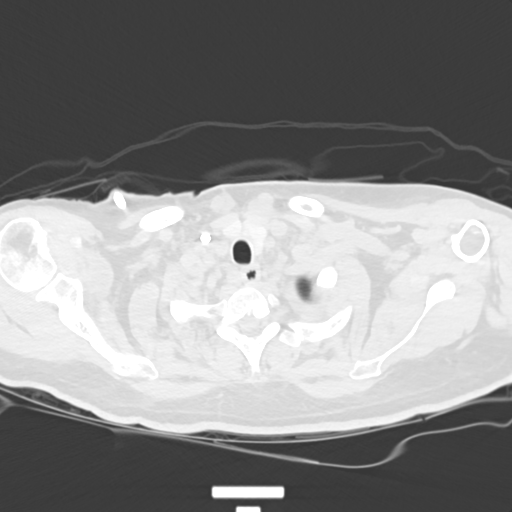

[Series 6: cor · coronal · 0.49mm/px · 3 of 128 slices shown]
[im 26/128  lung]
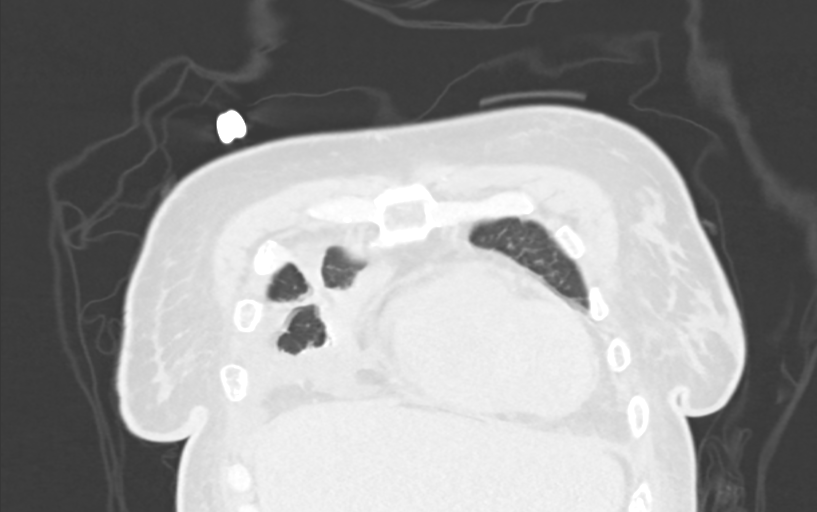
[im 51/128  lung]
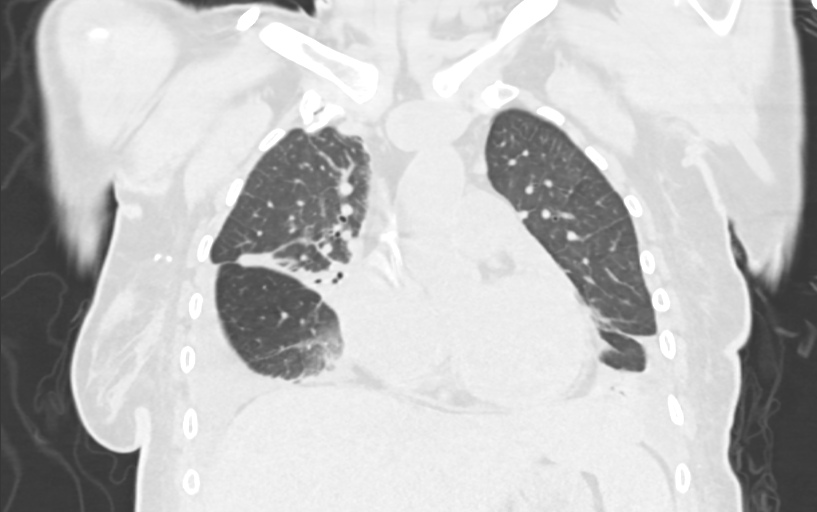
[im 77/128  lung]
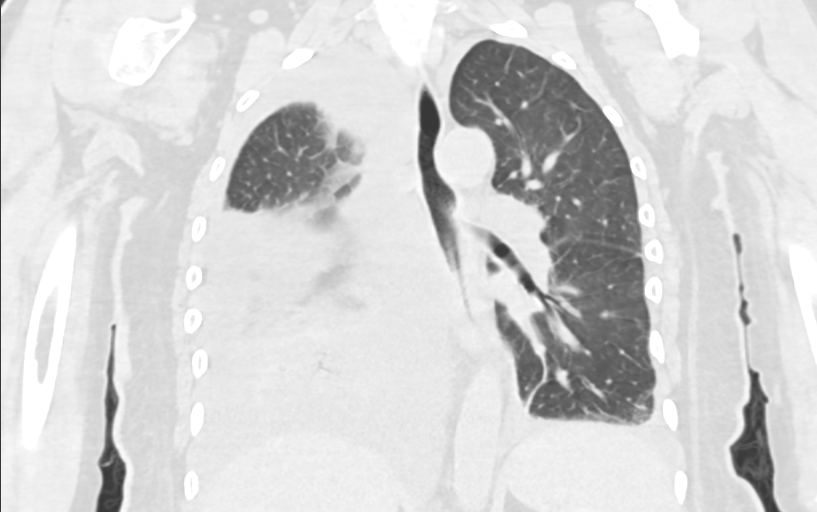

[14 of 36 positions shown; findings below may reference images not displayed]

FINDINGS: Cardiovascular: Heart is not significantly enlarged. A right jugular
temporary dialysis catheter is noted in place. No pericardial
effusion is seen. Minimal coronary calcifications are noted.

Mediastinum/Nodes: Thoracic inlet is within normal limits. Scattered
small mediastinal lymph nodes are noted which are not significant by
size criteria. Lack of contrast limits evaluation for hilar
adenopathy.

Lungs/Pleura: The left lung is well aerated with a moderate pleural
effusion identified. No sizable infiltrate or parenchymal nodule is
noted on the left. Minimal basilar atelectasis is seen. On the
right, a large pleural effusion is noted which corresponds with that
seen on the prior exam. The air-fluid level seen on the prior study
represents leveling of contrast along the major fissure posteriorly.
No cavitary lesion or air within the pleural fluid is noted.
Associated right lower lobe and right middle lobe consolidation is
noted. These changes likely represent pneumonia with parapneumonic
effusion. Again no nodular densities are seen.

Upper Abdomen: Visualized upper abdomen is within normal limits.

Musculoskeletal: Bony structures show no acute abnormality.
IMPRESSION: Bilateral pleural effusions right significantly greater than left.
No true air-fluid level is noted. The abnormality seen on previous
chest x-ray represents a confluence of shadows.

Right middle and right lower lobe consolidation consistent with
pneumonia.

## 2019-11-04 MED ORDER — DOXYCYCLINE HYCLATE 100 MG PO CAPS: 100.0000 mg | ORAL_CAPSULE | Freq: Two times a day (BID) | ORAL | 0 refills | Status: AC

## 2019-11-04 MED ORDER — ACETAMINOPHEN 325 MG PO TABS
650.0000 mg | ORAL_TABLET | Freq: Once | ORAL | Status: DC
  Filled 2019-11-04: qty 2

## 2019-11-04 MED ORDER — SILVER NITRATE-POT NITRATE 75-25 % EX MISC
1.0000 | Freq: Once | CUTANEOUS | Status: DC
  Filled 2019-11-04: qty 1

## 2019-11-04 MED ORDER — OXYMETAZOLINE HCL 0.05 % NA SOLN
1.0000 | Freq: Once | NASAL | Status: AC
  Administered 2019-11-04: 1 via NASAL
  Filled 2019-11-04: qty 30

## 2019-11-04 NOTE — Discharge Instructions (Addendum)
You were seen in the ER after a fall.  Your work-up today was overall reassuring. Please take the prescribed antibiotic twice a day for pneumonia. It is important to finish the course of antibiotics to prevent resistance. Your CT scan of your face did show a possible fracture of your nasal bone.  There is nothing for [place] to do here in the ER for it, normally is her cell feeling but please call the ear nose and throat doctors provided on your discharge paperwork to schedule an appointment to follow-up with them. Please call your PA with [facility] to schedule an appointment to review your CT scan findings. You are potassium was normal today, please make sure to follow-up with dialysis on Friday. Return to the ER if your symptoms worsen.

## 2019-11-04 NOTE — Telephone Encounter (Signed)
It looks like she is there for a nosebleed.   They will not address her effusion if she is only seen for epistaxis. Can you follow up with her later to make sure the CT and thoracentesis get scheduled? Thanks Richardson Dopp, PA-C    11/04/2019 11:16 AM

## 2019-11-04 NOTE — ED Triage Notes (Signed)
Pt reports tripping and falling this am and hitting her face, has nosebleed and redness to left eye. Dialysis pt, was due for treatment this morning.

## 2019-11-04 NOTE — ED Provider Notes (Signed)
  Physical Exam  BP (!) 141/100   Pulse 73   Temp 98 F (36.7 C) (Oral)   Resp 16   LMP 12/17/2016   SpO2 98%   Physical Exam  ED Course/Procedures   Clinical Course as of Nov 03 2236  Wed Nov 04, 2019  2026 CT Chest:  IMPRESSION: Bilateral pleural effusions right significantly greater than left. No true air-fluid level is noted. The abnormality seen on previous chest x-ray represents a confluence of shadows.  Right middle and right lower lobe consolidation consistent with pneumonia.      [MB]  2035 CT Maxillofacial IMPRESSION: Age-indeterminate mild deformity of the nasal bones. Chronic nasal septal deviation with potential superimposed acute nondisplaced fracture.  Retained secretions and probable blood products within the nasal cavity.     [MB]    Clinical Course User Index [MB] Garald Balding, PA-C    .Epistaxis Management  Date/Time: 11/04/2019 10:39 PM Performed by: Isla Pence, MD Authorized by: Isla Pence, MD   Consent:    Consent obtained:  Verbal   Consent given by:  Patient   Risks discussed:  Bleeding and pain   Alternatives discussed:  No treatment Procedure details:    Treatment site:  L anterior and L posterior   Treatment method:  Nasal balloon   Treatment complexity:  Limited   Treatment episode: recurring   Post-procedure details:    Assessment:  Bleeding decreased   Patient tolerance of procedure:  Tolerated well, no immediate complications Comments:     I put a total of 5 cc of air in her rapid rhino balloon.      MDM  Pt continued to have some oozing from her nostril.  She is on Brilinta and ASA.  I spoke with Dr. Wilburn Cornelia on for ENT and he recommended packing the right side.  The pt stopped me while trying this and said she did not want the packing on the right.  She wants to go home.  Gauze placed below nose.    Pt just had a STEMI with a stent placed, so pt needs to continue her blood thinners.  Pt told to  return if the bleeding worsened.      Isla Pence, MD 11/04/19 2242

## 2019-11-04 NOTE — Telephone Encounter (Signed)
-----   Message from Liliane Shi, [place] sent at 11/03/2019  8:20 PM EDT ----- Please call patient. Her CXR shows larger R sided effusion.  There is a question of the makeup of this effusion ("cavitary process").  A CT will help characterize this better.  She will also need her R sided effusion drained. PLAN:  1. Please schedule a Chest CT without contrast on 11/04/2019. 2. Please scheduled ultrasound guided R sided thoracentesis with interventional radiology at [facility].  Please make sure they know she is on Brilinta and ASA and cannot stop either.  Since she needs the CT, schedule it for Thursday 11/05/2019.  Richardson Dopp, PA-C    11/03/2019 8:16 PM

## 2019-11-04 NOTE — ED Provider Notes (Signed)
[facility] EMERGENCY DEPARTMENT Provider Note   CSN: [id number] Arrival date & time: 11/04/19  1027     History Chief Complaint  Patient presents with  . Fall  . Epistaxis    Melanie Hall is a 49 y.o. female.  HPI 49 year old female with a history of ESRD on dialysis MWF, HTN, DM type II, 2 STEMI's with the most recent one occurring on 10/18/2019, hyperlipidemia, GERD, stroke, acute systolic heart failure with an EF of 40-45 as of 10/19/2019 presents to the ER after falling down one step and hitting her face with subsequent nosebleed and facial pain.  Patient states that she tripped over her walker while she was walking down the steps, fell down one step and hit her head.  Denies LOC.  Maintains that this was a mechanical fall, denies any dizziness, chest pain, lightheadedness prior to the fall.  Patient refers that she started to have a nosebleed, and on the way to the ER had some bleeding out of her eye.  She is on a blood thinner.  Patient denies any vision changes, eye pain, headache, chest pain, shortness of breath, back pain, hip pain, lightheadedness, dizziness, syncope.  She states that she is having some pain under her ribs but this is chronic due to a  known floor pleural effusion.  Patient is still slightly bleeding out of her nose in the ER.      Past Medical History:  Diagnosis Date  . Anemia   . Cataracts, bilateral    surgery to remove  . Diabetes mellitus    type 2  . ESRD on hemodialysis ([place])   . GERD (gastroesophageal reflux disease)   . Hyperlipidemia   . Hypertension   . Stroke ([place])   . SVD (spontaneous vaginal delivery)    x 4    Patient Active Problem List   Diagnosis Date Noted  . Acute systolic heart failure ([place]) 10/21/2019  . STEMI (ST elevation myocardial infarction) ([place]) 10/18/2019  . STEMI involving right coronary artery ([place]) 10/18/2019  . Loose stools   . Acute cystitis without hematuria   . Weakness   . Weakness of both  lower extremities   . Debility 09/10/2019  . ESRD (end stage renal disease) on dialysis ([place]) 09/10/2019  . Depression 09/10/2019  . Incontinence in female 09/10/2019  . Pressure ulcer 09/10/2019  . Anemia in chronic kidney disease 08/19/2019  . Accelerated hypertension 08/08/2019  . Right arm weakness 08/08/2019  . MGUS (monoclonal gammopathy of unknown significance) 07/23/2019  . Acute on chronic renal failure ([place]) 02/28/2019  . GERD (gastroesophageal reflux disease) 02/28/2019  . Closed fracture of left femur ([place]) 10/28/2018  . Hypertension associated with diabetes ([place]) 10/28/2018  . Sprain of left ankle   . Emphysematous cystitis 08/26/2018  . Vaginosis 08/26/2018  . Normocytic anemia 08/26/2018  . Closed fracture of right ankle 11/06/2017  . IRREGULAR MENSES 09/14/2009  . HYPERCHOLESTEROLEMIA 11/19/2008  . PARONYCHIA, RIGHT GREAT TOE 07/30/2008  . Type 2 diabetes with nephropathy ([place]) 07/02/2007  . CATARACTS, BILATERAL 07/02/2007    Past Surgical History:  Procedure Laterality Date  . AV FISTULA PLACEMENT Left 08/17/2019   Procedure: LEFT BRACHIAL CEPHALIC ARTERIOVENOUS (AV) FISTULA;  Surgeon: Angelia Mould, MD;  Location: [facility];  Service: Vascular;  Laterality: Left;  . CORONARY STENT INTERVENTION N/A 10/18/2019   Procedure: CORONARY STENT INTERVENTION;  Surgeon: Sherren Mocha, MD;  Location: [facility];  Service: Cardiovascular;  Laterality: N/A;  .  CORONARY/GRAFT ACUTE MI REVASCULARIZATION N/A 10/18/2019   Procedure: Coronary/Graft Acute MI Revascularization;  Surgeon: Sherren Mocha, MD;  Location: [facility];  Service: Cardiovascular;  Laterality: N/A;  . EYE SURGERY Bilateral    cataracts removed  . FEMUR IM NAIL Left 10/28/2018   Procedure: RETROGRADE FEMORAL NAILING;  Surgeon: Meredith Pel, MD;  Location: [facility];  Service: Orthopedics;  Laterality: Left;  . IM NAILING FEMORAL SHAFT RETROGRADE Left 10/28/2018  . INTRAVASCULAR  ULTRASOUND/IVUS N/A 10/18/2019   Procedure: Intravascular Ultrasound/IVUS;  Surgeon: Sherren Mocha, MD;  Location: [facility];  Service: Cardiovascular;  Laterality: N/A;  . IR FLUORO GUIDE CV LINE RIGHT  08/11/2019  . IR US GUIDE VASC ACCESS RIGHT  08/11/2019  . KNEE SURGERY Left   . LEFT HEART CATH AND CORONARY ANGIOGRAPHY N/A 10/18/2019   Procedure: LEFT HEART CATH AND CORONARY ANGIOGRAPHY;  Surgeon: Sherren Mocha, MD;  Location: [facility];  Service: Cardiovascular;  Laterality: N/A;  . RADIOLOGY WITH ANESTHESIA N/A 09/15/2019   Procedure: [facility] AND LUMBER LOWER BACK PAIN;  Surgeon: Radiologist, Medication, MD;  Location: [facility];  Service: Radiology;  Laterality: N/A;  . TUBAL LIGATION       OB History    Gravida  4   Para  4   Term  4   Preterm      AB      Living        SAB      TAB      Ectopic      Multiple      Live Births              Family History  Problem Relation Age of Onset  . Diabetes Mother   . Hypertension Mother   . Bipolar disorder Mother   . Brain cancer Maternal Aunt   . Heart disease Maternal Grandmother   . Diabetes Maternal Grandmother   . Breast cancer Maternal Aunt   . Depression Father        Committed suicide  . Diabetes Sister     Social History   Tobacco Use  . Smoking status: Former Smoker    Packs/day: 0.25    Years: 2.00    Pack years: 0.50    Types: Cigarettes    Quit date: 1997    Years since quitting: 24.3  . Smokeless tobacco: Never Used  Substance Use Topics  . Alcohol use: No  . Drug use: No    Home Medications Prior to Admission medications   Medication Sig Start Date End Date Taking? Authorizing Provider  aspirin EC 81 MG EC tablet Take 1 tablet (81 mg total) by mouth daily. 10/22/19  Yes Cheryln Manly, NP  atorvastatin (LIPITOR) 80 MG tablet Take 1 tablet (80 mg total) by mouth daily at 6 PM. 10/21/19  Yes Reino Bellis B, NP  carvedilol (COREG) 6.25 MG tablet Take 1.5 tablets  (9.375 mg total) by mouth 2 (two) times daily. 11/03/19  Yes Weaver, Scott T, PA-C  epoetin alfa (EPOGEN) 3000 UNIT/ML injection Inject 3,000 Units into the skin every 14 (fourteen) days.  06/12/19  Yes [provider]  gabapentin (NEURONTIN) 300 MG capsule Take 1 capsule (300 mg total) by mouth at bedtime. 09/19/19 11/04/19 Yes Antonieta Pert, MD  insulin aspart protamine- aspart (NOVOLOG MIX 70/30) (70-30) 100 UNIT/ML injection Inject 10 Units into the skin 2 (two) times daily with a meal.    Yes [provider]  levothyroxine (SYNTHROID) 25 MCG tablet Take 1 tablet (25 mcg total) by mouth daily before breakfast. 10/29/19  Yes Soyla Dryer, PA-C  multivitamin (RENA-VIT) TABS tablet Take 1 tablet by mouth at bedtime. 08/25/19  Yes Hoyt Koch, MD  nitroGLYCERIN (NITROSTAT) 0.4 MG SL tablet Place 1 tablet (0.4 mg total) under the tongue every 5 (five) minutes x 3 doses as needed for chest pain. 10/21/19  Yes Reino Bellis B, NP  sertraline (ZOLOFT) 25 MG tablet Take 1 tablet (25 mg total) by mouth daily. 10/29/19 11/28/19 Yes Soyla Dryer, PA-C  tamsulosin (FLOMAX) 0.4 MG CAPS capsule Take 1 capsule (0.4 mg total) by mouth daily after supper. 10/29/19  Yes Soyla Dryer, PA-C  ticagrelor (BRILINTA) 90 MG TABS tablet Take 1 tablet (90 mg total) by mouth 2 (two) times daily. 10/21/19  Yes Cheryln Manly, NP  zolpidem (AMBIEN) 5 MG tablet Take 1 tablet (5 mg total) by mouth at bedtime as needed for sleep. 10/29/19  Yes Soyla Dryer, PA-C  doxycycline (VIBRAMYCIN) 100 MG capsule Take 1 capsule (100 mg total) by mouth 2 (two) times daily for 5 days. 11/04/19 11/09/19  Garald Balding, PA-C  sulfamethoxazole-trimethoprim (BACTRIM DS) 800-160 MG tablet Take 1 tablet by mouth every other day. 11/01/19   [provider]    Allergies    Patient has no known allergies.  Review of Systems   Review of Systems  Constitutional: Negative for chills and fever.  HENT:  Positive for nosebleeds and sinus pressure. Negative for sneezing and sore throat.   Eyes: Positive for discharge. Negative for photophobia, pain, redness, itching and visual disturbance.  Respiratory: Negative for cough, chest tightness and shortness of breath.   Cardiovascular: Negative for chest pain, palpitations and leg swelling.  Gastrointestinal: Negative for abdominal pain.  Musculoskeletal: Negative for back pain.  Skin: Negative for rash.  Neurological: Negative for dizziness, syncope, facial asymmetry, speech difficulty, weakness, light-headedness, numbness and headaches.  Psychiatric/Behavioral: Negative for confusion.  All other systems reviewed and are negative.   Physical Exam Updated Vital Signs BP 117/76 (BP Location: Left Arm)   Pulse (!) 103   Temp 98 F (36.7 C) (Oral)   Resp 12   LMP 12/17/2016   SpO2 94%   Physical Exam Constitutional:      General: She is not in acute distress.    Appearance: Normal appearance. She is not ill-appearing, toxic-appearing or diaphoretic.  HENT:     Head: Normocephalic and atraumatic.     Nose: Nose normal.     Comments: Visible mild bruising, superficial scratch on nose bridge.  No evidence of step-offs, crepitus, fractures.  Patient able to breathe through her nose without issues.  Scant blood in left nostril.  No evidence of bleeding from right nostril, normal turbinates.    Mouth/Throat:     Mouth: Mucous membranes are moist.     Pharynx: Oropharynx is clear.  Eyes:     General: Lids are normal. Lids are everted, no foreign bodies appreciated. Gaze aligned appropriately. No visual field deficit.       Right eye: No foreign body or discharge.        Left eye: No foreign body or discharge.     Extraocular Movements: Extraocular movements intact.     Right eye: Nystagmus present. Normal extraocular motion.     Left eye: Nystagmus present. Normal extraocular motion.     Conjunctiva/sclera: Conjunctivae normal.     Right  eye: Right conjunctiva is not injected.  No hemorrhage.    Left eye: Left conjunctiva is not injected. No hemorrhage.    Pupils: Pupils are equal, round, and reactive to light.     Comments: No evidence of hyphema, globe rupture, normal conjunctiva, cornea clear, pupils equal and reactive, normal EOMs, visual acuity normal.  Horizontal nystagmus noted.  No maxillary step-offs, crepitus, evidence of injury.  Slight trace of dried up blood and skin below left eye.  Neck:     Comments: Midline C-spine tenderness Cardiovascular:     Rate and Rhythm: Normal rate and regular rhythm.     Pulses: Normal pulses.     Heart sounds: Normal heart sounds.  Pulmonary:     Effort: Pulmonary effort is normal.     Breath sounds: Normal breath sounds.  Abdominal:     General: Abdomen is flat.     Palpations: Abdomen is soft.     Tenderness: There is no abdominal tenderness.  Musculoskeletal:        General: Tenderness present. No swelling or signs of injury. Normal range of motion.     Cervical back: Normal range of motion.     Comments: Midline C-spine tenderness.  Normal range of motion of neck, strength, range of motion, and sensations intact in upper and lower extremities.  Skin:    General: Skin is warm.     Findings: Bruising and lesion present.  Neurological:     General: No focal deficit present.     Mental Status: She is alert and oriented to person, place, and time.     Cranial Nerves: No cranial nerve deficit.     Sensory: No sensory deficit.     Motor: No weakness.     Coordination: Coordination normal.     Comments: Mental Status:  Alert, thought content appropriate, able to give a coherent history. Speech fluent without evidence of aphasia. Able to follow 2 step commands without difficulty.  Cranial Nerves:  II: Peripheral visual fields grossly normal, pupils equal, round, reactive to light III,IV, VI: ptosis not present, extra-ocular motions intact bilaterally  V,VII: smile symmetric,  facial light touch sensation equal VIII: hearing grossly normal to voice  X: uvula elevates symmetrically  XI: bilateral shoulder shrug symmetric and strong XII: midline tongue extension without fassiculations Motor:  Normal tone. 5/5 strength of BUE and BLE major muscle groups including strong and equal grip strength and dorsiflexion/plantar flexion Sensory: light touch normal in all extremities. Cerebellar: normal finger-to-nose with bilateral upper extremities, Romberg sign absent Gait: normal gait and balance. Able to walk on toes and heels with ease.    Psychiatric:        Mood and Affect: Mood normal.        Behavior: Behavior normal.     ED Results / Procedures / Treatments   Labs (all labs ordered are listed, but only abnormal results are displayed) Labs Reviewed  BASIC METABOLIC PANEL    EKG None  Radiology DG Chest 2 View  Result Date: 11/03/2019 CLINICAL DATA:  Hypoxia EXAM: CHEST - 2 VIEW COMPARISON:  10/18/2019, 09/10/2019 FINDINGS: Right-sided central venous catheter tip projects over the cavoatrial region. Moderate right pleural effusion, appears increased compared to prior. Small left-sided pleural effusion, also likely increased. Enlarged cardiomediastinal silhouette with vascular congestion. Basilar consolidations. Meniscus level over the right mid chest. IMPRESSION: 1. Moderate right pleural effusion, increased compared to prior with increased airspace disease at the right middle lobe and right base. Meniscus level in the right lateral chest, could relate to  a cavitary process. Chest CT is suggested to further evaluate this finding. 2. Small left pleural effusion, also increased compared to prior 3. Cardiomegaly with vascular congestion. Electronically Signed   By: Donavan Foil M.D.   On: 11/03/2019 16:29   CT Head Wo Contrast  Result Date: 11/04/2019 CLINICAL DATA:  Head trauma, headache EXAM: CT HEAD WITHOUT CONTRAST TECHNIQUE: Contiguous axial images were  obtained from the base of the skull through the vertex without intravenous contrast. COMPARISON:  09/10/2019 FINDINGS: Brain: There is no acute intracranial hemorrhage, mass effect, or edema. Gray-white differentiation is preserved. There is no extra-axial fluid collection. Ventricles and sulci are within normal limits in size and configuration. Minimal patchy hypoattenuation the supratentorial white matter is nonspecific. Vascular: There is atherosclerotic calcification at the skull base. Skull: Calvarium is unremarkable. Sinuses/Orbits: Refer to maxillofacial CT. Other: Mastoid air cells are clear. IMPRESSION: No evidence of acute intracranial injury. Electronically Signed   By: Macy Mis M.D.   On: 11/04/2019 20:14   CT Chest Wo Contrast  Result Date: 11/04/2019 CLINICAL DATA:  Right-sided effusion with possible air-fluid level to suggest cavitary lesion EXAM: CT CHEST WITHOUT CONTRAST TECHNIQUE: Multidetector CT imaging of the chest was performed following the standard protocol without IV contrast. COMPARISON:  Chest x-ray from the previous day. FINDINGS: Cardiovascular: Heart is not significantly enlarged. A right jugular temporary dialysis catheter is noted in place. No pericardial effusion is seen. Minimal coronary calcifications are noted. Mediastinum/Nodes: Thoracic inlet is within normal limits. Scattered small mediastinal lymph nodes are noted which are not significant by size criteria. Lack of contrast limits evaluation for hilar adenopathy. Lungs/Pleura: The left lung is well aerated with a moderate pleural effusion identified. No sizable infiltrate or parenchymal nodule is noted on the left. Minimal basilar atelectasis is seen. On the right, a large pleural effusion is noted which corresponds with that seen on the prior exam. The air-fluid level seen on the prior study represents leveling of contrast along the major fissure posteriorly. No cavitary lesion or air within the pleural fluid is  noted. Associated right lower lobe and right middle lobe consolidation is noted. These changes likely represent pneumonia with parapneumonic effusion. Again no nodular densities are seen. Upper Abdomen: Visualized upper abdomen is within normal limits. Musculoskeletal: Bony structures show no acute abnormality. IMPRESSION: Bilateral pleural effusions right significantly greater than left. No true air-fluid level is noted. The abnormality seen on previous chest x-ray represents a confluence of shadows. Right middle and right lower lobe consolidation consistent with pneumonia. Electronically Signed   By: Inez Catalina M.D.   On: 11/04/2019 20:18   CT Cervical Spine Wo Contrast  Result Date: 11/04/2019 CLINICAL DATA:  Head trauma EXAM: CT CERVICAL SPINE WITHOUT CONTRAST TECHNIQUE: Multidetector CT imaging of the cervical spine was performed without intravenous contrast. Multiplanar CT image reconstructions were also generated. COMPARISON:  None. FINDINGS: Alignment: Anteroposterior alignment is maintained Skull base and vertebrae: No acute cervical spine fracture. Vertebral body heights are stable. Soft tissues and spinal canal: No prevertebral fluid or swelling. No visible canal hematoma. Disc levels:  No significant stenosis. Upper chest: Right greater than left pleural effusions. Refer to dedicated chest imaging. Other: None IMPRESSION: No acute cervical spine fracture. Electronically Signed   By: Macy Mis M.D.   On: 11/04/2019 20:28   CT Maxillofacial Wo Contrast  Result Date: 11/04/2019 CLINICAL DATA:  Head trauma EXAM: CT MAXILLOFACIAL WITHOUT CONTRAST TECHNIQUE: Multidetector CT imaging of the maxillofacial structures was performed. Multiplanar CT image  reconstructions were also generated. COMPARISON:  None. FINDINGS: Osseous: No definite acute fracture. Mild deformity of the nasal bones is age-indeterminate. There is chronic nasal septal deviation with possible acute nondisplaced fracture. Orbits:  No intraorbital hematoma. Sinuses: Mucosal thickening. Layering fluid in the left maxillary sinus. Partial opacification of the nasal cavity left greater than right. Areas of increased density likely reflect blood products. Soft tissues: No large hematoma. Limited intracranial: Dictated separately. IMPRESSION: Age-indeterminate mild deformity of the nasal bones. Chronic nasal septal deviation with potential superimposed acute nondisplaced fracture. Retained secretions and probable blood products within the nasal cavity. Electronically Signed   By: Macy Mis M.D.   On: 11/04/2019 20:25    Procedures Procedures (including critical care time)  Medications Ordered in ED Medications  acetaminophen (TYLENOL) tablet 650 mg (has no administration in time range)  silver nitrate applicators applicator 1 Stick (has no administration in time range)  oxymetazoline (AFRIN) 0.05 % nasal spray 1 spray (1 spray Each Nare Given 11/04/19 1845)    ED Course  I have reviewed the triage vital signs and the nursing notes.  Pertinent labs & imaging results that were available during my care of the patient were reviewed by me and considered in my medical decision making (see chart for details).  Clinical Course as of Nov 03 2056  Wed Nov 04, 2019  2026 CT Chest:  IMPRESSION: Bilateral pleural effusions right significantly greater than left. No true air-fluid level is noted. The abnormality seen on previous chest x-ray represents a confluence of shadows.  Right middle and right lower lobe consolidation consistent with pneumonia.      [MB]  2035 CT Maxillofacial IMPRESSION: Age-indeterminate mild deformity of the nasal bones. Chronic nasal septal deviation with potential superimposed acute nondisplaced fracture.  Retained secretions and probable blood products within the nasal cavity.     [MB]    Clinical Course User Index [MB] Lyndel Safe   MDM Rules/Calculators/A&P                      49 year old female s/p mechanical fall down 1 flight of stairs with facial injury. On presentation to the ER, patient is alert and oriented, no acute distress, speaking full sentences, able to clearly recollect the events leading up and after the fall.  She denies LOC.  Vitals overall reassuring.  Physical exam positive for some bruising and superficial scratches on nose bridge.  No evidence of history of old deficits, hyphema, foreign bodies, maxillofacial injury.  Suspect mild bleeding from left lacrimal duct.  No neuro deficits.  Full range of motion of neck, though she does have some midline C-spine tenderness. Mild bleeding out of left nostril.  Will CT head, C-spine, maxillofacial as a precaution.  Patient is not on a blood thinner.  Of note, CT chest order was pulled after I ordered CTs of head, maxillofacial, neck.  On further chart review, it appears the patient's  Richardson Dopp, PA-C w/ [facility] ordered it after  chest x-ray yesterday noted to have bilateral pleural effusions.  CT of chest appears to be ordered by him.  Unclear how this order got pulled over but we will perform this as well given the patient is already in CT.   CT head without acute intracranial abnormality.  CT Chest with bilateral pleural effusions, right significant in the left and there is evidence of right middle and right lower lobe pneumonia.  Upon further questioning, patient endorses cough over the last  few days, but no fevers, chills or any other symptoms.  We will treat her for CAP with Doxy x5 days.  Patient is not hypoxic or tachypneic in the ER at this time.  CT maxillofacial with possible evidence of acute nondisplaced fracture of the nose, will refer to HEENT. No other maxillofacial abnormalities seen other than residual blood and secretions in nasal cavity. CT C-spine negative for fractures.  Applied silver nitrate to left nostril which stopped bleeding successfully, patient has not had any rebleeds in  the ER.  Patient reports missing dialysis.  Will order BMP to assess potassium, CT with evidence of pleural effusion, but the patient is overall well-appearing, without shortness of breath or evidence of fluid overload on PE.  Bilateral pleural effusion also appears chronic.  Signed out patient to Dr. Gilford Raid, pending normal BMP pt will be stable for discharge. Return precautions given. Pt voices understanding and is agreeable to the plan.   Pt was seen and evaluated by Dr. Gilford Raid and she is agreable to the above plan.   Final Clinical Impression(s) / ED Diagnoses Final diagnoses:  Closed fracture of nasal bone, initial encounter  Fall, initial encounter  Community acquired pneumonia of right lung, unspecified part of lung    Rx / DC Orders ED Discharge Orders         Ordered    doxycycline (VIBRAMYCIN) 100 MG capsule  2 times daily     11/04/19 2033           Lyndel Safe 11/04/19 2101    Isla Pence, MD 11/04/19 2303

## 2019-11-04 NOTE — ED Notes (Addendum)
Pt given dc instructions. Advised to call ENT in morning to schedule f/u for packing removal. Also advised to pick up abx rx. Pt verbalized understanding. Brought to lobby via wc  in stable condition to wait for ride.

## 2019-11-04 NOTE — Telephone Encounter (Signed)
Was attempting to get Chest CT scheduled at our office since pt is self pay and she presented at the ED while I was on the phone. Will route to Richardson Dopp, PA-C to make him aware.

## 2019-11-05 ENCOUNTER — Other Ambulatory Visit (HOSPITAL_COMMUNITY): Payer: Self-pay | Admitting: Physician Assistant

## 2019-11-05 ENCOUNTER — Telehealth: Payer: Self-pay | Admitting: *Deleted

## 2019-11-05 DIAGNOSIS — J90 Pleural effusion, not elsewhere classified: Secondary | ICD-10-CM

## 2019-11-05 NOTE — Telephone Encounter (Signed)
See results on CT and phone note from 4/29

## 2019-11-05 NOTE — Telephone Encounter (Signed)
I spoke with Interventional Radiology and procedure scheduled for 5/6 at 2:00.  Arrival time is 1:45.  I spoke with [facility] covid testing site and appointment can be placed for any available time on 5/3.  Pt can arrive as early as 8 AM for testing.  I placed call to patient and left message to call back

## 2019-11-05 NOTE — Telephone Encounter (Signed)
I spoke with patient and gave her covid testing information/location. She is aware to go after 8 AM on Monday-5/3.  She will let us know if she is still having bleeding issues. Patient aware she will need to quarantine except for medical appointments after covid testing. Patient made aware procedure in radiology is 5/6 and she should arrive in radiology at 1:45.

## 2019-11-05 NOTE — Telephone Encounter (Signed)
I placed another call to patient to follow up.  Left message to call office

## 2019-11-05 NOTE — Telephone Encounter (Signed)
-----   Message from Liliane Shi, [place] sent at 11/05/2019  7:48 AM EDT ----- Chest CT demonstrates significant right-sided pleural effusion as well as evidence of right middle and right lower lobe pneumonia. Reviewed ED notes.  Provider in the ED placed the patient on antibiotics to cover for pneumonia. PLAN:   - Please arrange right-sided ultrasound-guided thoracentesis with interventional radiology at [facility].  - Please arrange urgent referral to pulmonology for hypoxia related to pneumonia with parapneumonic effusion.  - Send copy of chest x-ray and CT to nephrologist and primary care provider.  - Patient should return to the ED if she has worsening shortness of breath. Richardson Dopp, PA-C    11/05/2019 7:44 AM

## 2019-11-05 NOTE — Telephone Encounter (Signed)
Patient aware to go to ED if she has increasing shortness of breath

## 2019-11-05 NOTE — Telephone Encounter (Signed)
Patient's daughter reports patient will have dialysis as planned on Friday.  Patient is available after 1:15 today to have thoracentesis if this can be arranged. PCP is Soyla Dryer, [place]. Nephrologist is Dr Theador Hawthorne in [place].  Results routed to these providers.  I spoke with radiology and patient will need covid testing prior to thoracentesis. Interventional radiology has placed a hold on 10 AM spot for thoracentesis on May 4,2021. Patient should arrive in radiology at 9:45.  Does not need to be npo. IR needs to know if fluid should be sent for labs and if so which labs.  Patient should have covid testing tomorrow morning.  I spoke with patient's daughter to arrange covid testing tomorrow morning. She reports testing will need to be done around 8 AM as patient will need to be back home for uber to pick her up and take her to dialysis in [place] at 11 AM.  Daughter is asking if covid nasal swab test can be done with patient's nasal bleeding. She reports patient has nasal bone fracture and has balloon and drainage tube in left nostril. Continuing to have bleeding from right nostril.

## 2019-11-05 NOTE — Telephone Encounter (Signed)
I spoke with patient and her daughter and gave them information from [place], [place].  Patient lives in [place] but is currently staying with her daughter in [place].  Will make referral to [facility] in [place]. Patient missed dialysis yesterday due to ED visit. Daughter is calling dialysis center today to see if patient will have dialysis today.  Usual dialysis days are Monday, Wednesday and Friday -11-3.  I will call daughter back to follow up

## 2019-11-05 NOTE — Telephone Encounter (Signed)
Since she still has epistaxis and it cannot be done until 5/4, is it possible to get her COVID test on Monday and change the thoracentesis to 5/5 or 5/6?   This would give her a couple days to get the R nostril to stop bleeding/follow up with ENT before the swab is done.    The pleura fluid should be sent for testing.   As noted before, if she has more difficulty breathing, she should go to the ED.  It seems the effusion is somewhat chronic, so we should be ok to wait until next week.  But, if she gets more short of breath, she may need it done more urgently.    Richardson Dopp, PA-C    11/05/2019 10:12 AM

## 2019-11-06 ENCOUNTER — Encounter (HOSPITAL_COMMUNITY): Payer: Self-pay

## 2019-11-06 DIAGNOSIS — D631 Anemia in chronic kidney disease: Secondary | ICD-10-CM | POA: Diagnosis not present

## 2019-11-06 DIAGNOSIS — T82898A Other specified complication of vascular prosthetic devices, implants and grafts, initial encounter: Secondary | ICD-10-CM | POA: Diagnosis not present

## 2019-11-06 DIAGNOSIS — Z992 Dependence on renal dialysis: Secondary | ICD-10-CM | POA: Diagnosis not present

## 2019-11-06 DIAGNOSIS — T82848A Pain from vascular prosthetic devices, implants and grafts, initial encounter: Secondary | ICD-10-CM | POA: Diagnosis not present

## 2019-11-06 DIAGNOSIS — N186 End stage renal disease: Secondary | ICD-10-CM | POA: Diagnosis not present

## 2019-11-06 DIAGNOSIS — D509 Iron deficiency anemia, unspecified: Secondary | ICD-10-CM | POA: Diagnosis not present

## 2019-11-09 ENCOUNTER — Other Ambulatory Visit (HOSPITAL_COMMUNITY)
Admission: RE | Admit: 2019-11-09 | Discharge: 2019-11-09 | Disposition: A | Discharge status: Home / Self Care | Payer: Medicaid Other | Source: Ambulatory Visit | Attending: Physician Assistant | Admitting: Physician Assistant

## 2019-11-09 ENCOUNTER — Telehealth (HOSPITAL_COMMUNITY): Payer: Self-pay | Admitting: *Deleted

## 2019-11-09 DIAGNOSIS — Z992 Dependence on renal dialysis: Secondary | ICD-10-CM | POA: Diagnosis not present

## 2019-11-09 DIAGNOSIS — Z01812 Encounter for preprocedural laboratory examination: Secondary | ICD-10-CM | POA: Insufficient documentation

## 2019-11-09 DIAGNOSIS — Z20822 Contact with and (suspected) exposure to covid-19: Secondary | ICD-10-CM | POA: Insufficient documentation

## 2019-11-09 DIAGNOSIS — D509 Iron deficiency anemia, unspecified: Secondary | ICD-10-CM | POA: Diagnosis not present

## 2019-11-09 DIAGNOSIS — D631 Anemia in chronic kidney disease: Secondary | ICD-10-CM | POA: Diagnosis not present

## 2019-11-09 DIAGNOSIS — N186 End stage renal disease: Secondary | ICD-10-CM | POA: Diagnosis not present

## 2019-11-09 LAB — SARS CORONAVIRUS 2 (TAT 6-24 HRS): SARS Coronavirus 2: NEGATIVE

## 2019-11-09 NOTE — Telephone Encounter (Signed)
Called tp discuss referral. Left message for patient to call us back.  Diem Pag[employer]

## 2019-11-11 DIAGNOSIS — N186 End stage renal disease: Secondary | ICD-10-CM | POA: Diagnosis not present

## 2019-11-11 DIAGNOSIS — D509 Iron deficiency anemia, unspecified: Secondary | ICD-10-CM | POA: Diagnosis not present

## 2019-11-11 DIAGNOSIS — D631 Anemia in chronic kidney disease: Secondary | ICD-10-CM | POA: Diagnosis not present

## 2019-11-11 DIAGNOSIS — Z992 Dependence on renal dialysis: Secondary | ICD-10-CM | POA: Diagnosis not present

## 2019-11-12 ENCOUNTER — Ambulatory Visit (HOSPITAL_COMMUNITY)
Admission: RE | Admit: 2019-11-12 | Discharge: 2019-11-12 | Disposition: A | Discharge status: Home / Self Care | Payer: Medicaid Other | Source: Ambulatory Visit | Attending: Physician Assistant | Admitting: Physician Assistant

## 2019-11-12 ENCOUNTER — Telehealth: Payer: Self-pay

## 2019-11-12 ENCOUNTER — Ambulatory Visit: Payer: Self-pay

## 2019-11-12 ENCOUNTER — Other Ambulatory Visit: Payer: Self-pay

## 2019-11-12 ENCOUNTER — Emergency Department (HOSPITAL_COMMUNITY)
Admission: EM | Admit: 2019-11-12 | Discharge: 2019-11-13 | Disposition: A | Discharge status: Home / Self Care | Payer: Medicaid Other | Attending: Emergency Medicine | Admitting: Emergency Medicine

## 2019-11-12 ENCOUNTER — Telehealth: Payer: Self-pay | Admitting: Physician Assistant

## 2019-11-12 ENCOUNTER — Other Ambulatory Visit: Payer: Self-pay | Admitting: Physician Assistant

## 2019-11-12 DIAGNOSIS — S93402A Sprain of unspecified ligament of left ankle, initial encounter: Secondary | ICD-10-CM

## 2019-11-12 DIAGNOSIS — N186 End stage renal disease: Secondary | ICD-10-CM | POA: Insufficient documentation

## 2019-11-12 DIAGNOSIS — Z79899 Other long term (current) drug therapy: Secondary | ICD-10-CM | POA: Insufficient documentation

## 2019-11-12 DIAGNOSIS — D631 Anemia in chronic kidney disease: Secondary | ICD-10-CM | POA: Diagnosis not present

## 2019-11-12 DIAGNOSIS — J95830 Postprocedural hemorrhage and hematoma of a respiratory system organ or structure following a respiratory system procedure: Secondary | ICD-10-CM | POA: Insufficient documentation

## 2019-11-12 DIAGNOSIS — E1122 Type 2 diabetes mellitus with diabetic chronic kidney disease: Secondary | ICD-10-CM | POA: Insufficient documentation

## 2019-11-12 DIAGNOSIS — J9811 Atelectasis: Secondary | ICD-10-CM | POA: Diagnosis not present

## 2019-11-12 DIAGNOSIS — Z794 Long term (current) use of insulin: Secondary | ICD-10-CM | POA: Insufficient documentation

## 2019-11-12 DIAGNOSIS — Z992 Dependence on renal dialysis: Secondary | ICD-10-CM | POA: Insufficient documentation

## 2019-11-12 DIAGNOSIS — J90 Pleural effusion, not elsewhere classified: Secondary | ICD-10-CM | POA: Diagnosis not present

## 2019-11-12 DIAGNOSIS — T819XXA Unspecified complication of procedure, initial encounter: Secondary | ICD-10-CM

## 2019-11-12 DIAGNOSIS — S022XXD Fracture of nasal bones, subsequent encounter for fracture with routine healing: Secondary | ICD-10-CM | POA: Diagnosis not present

## 2019-11-12 DIAGNOSIS — R195 Other fecal abnormalities: Secondary | ICD-10-CM

## 2019-11-12 DIAGNOSIS — R29898 Other symptoms and signs involving the musculoskeletal system: Secondary | ICD-10-CM

## 2019-11-12 DIAGNOSIS — I120 Hypertensive chronic kidney disease with stage 5 chronic kidney disease or end stage renal disease: Secondary | ICD-10-CM | POA: Insufficient documentation

## 2019-11-12 DIAGNOSIS — Z7982 Long term (current) use of aspirin: Secondary | ICD-10-CM | POA: Insufficient documentation

## 2019-11-12 DIAGNOSIS — R846 Abnormal cytological findings in specimens from respiratory organs and thorax: Secondary | ICD-10-CM | POA: Diagnosis not present

## 2019-11-12 DIAGNOSIS — R58 Hemorrhage, not elsewhere classified: Secondary | ICD-10-CM

## 2019-11-12 DIAGNOSIS — J449 Chronic obstructive pulmonary disease, unspecified: Secondary | ICD-10-CM | POA: Diagnosis not present

## 2019-11-12 DIAGNOSIS — N189 Chronic kidney disease, unspecified: Secondary | ICD-10-CM

## 2019-11-12 DIAGNOSIS — Z87891 Personal history of nicotine dependence: Secondary | ICD-10-CM | POA: Insufficient documentation

## 2019-11-12 DIAGNOSIS — N3000 Acute cystitis without hematuria: Secondary | ICD-10-CM

## 2019-11-12 HISTORY — PX: IR THORACENTESIS ASP PLEURAL SPACE W/IMG GUIDE: IMG5380

## 2019-11-12 HISTORY — DX: Other fecal abnormalities: R19.5

## 2019-11-12 LAB — PROTEIN, PLEURAL OR PERITONEAL FLUID: Total protein, fluid: 3.2 g/dL

## 2019-11-12 LAB — CBC WITH DIFFERENTIAL/PLATELET
Abs Immature Granulocytes: 0.05 10*3/uL (ref 0.00–0.07)
Basophils Absolute: 0.1 10*3/uL (ref 0.0–0.1)
Basophils Relative: 1 %
Eosinophils Absolute: 0.4 10*3/uL (ref 0.0–0.5)
Eosinophils Relative: 5 %
HCT: 35.4 % — ABNORMAL LOW (ref 36.0–46.0)
Hemoglobin: 11 g/dL — ABNORMAL LOW (ref 12.0–15.0)
Immature Granulocytes: 1 %
Lymphocytes Relative: 17 %
Lymphs Abs: 1.4 10*3/uL (ref 0.7–4.0)
MCH: 32.1 pg (ref 26.0–34.0)
MCHC: 31.1 g/dL (ref 30.0–36.0)
MCV: 103.2 fL — ABNORMAL HIGH (ref 80.0–100.0)
Monocytes Absolute: 0.7 10*3/uL (ref 0.1–1.0)
Monocytes Relative: 9 %
Neutro Abs: 5.5 10*3/uL (ref 1.7–7.7)
Neutrophils Relative %: 67 %
Platelets: 186 10*3/uL (ref 150–400)
RBC: 3.43 MIL/uL — ABNORMAL LOW (ref 3.87–5.11)
RDW: 15.9 % — ABNORMAL HIGH (ref 11.5–15.5)
WBC: 8.1 10*3/uL (ref 4.0–10.5)
nRBC: 0 % (ref 0.0–0.2)

## 2019-11-12 LAB — BASIC METABOLIC PANEL
Anion gap: 13 (ref 5–15)
BUN: 35 mg/dL — ABNORMAL HIGH (ref 6–20)
CO2: 22 mmol/L (ref 22–32)
Calcium: 8.9 mg/dL (ref 8.9–10.3)
Chloride: 101 mmol/L (ref 98–111)
Creatinine, Ser: 4.27 mg/dL — ABNORMAL HIGH (ref 0.44–1.00)
GFR calc Af Amer: 13 mL/min — ABNORMAL LOW (ref >60)
GFR calc non Af Amer: 11 mL/min — ABNORMAL LOW (ref >60)
Glucose, Bld: 165 mg/dL — ABNORMAL HIGH (ref 70–99)
Potassium: 5.1 mmol/L (ref 3.5–5.1)
Sodium: 136 mmol/L (ref 135–145)

## 2019-11-12 LAB — LACTATE DEHYDROGENASE, PLEURAL OR PERITONEAL FLUID: LD, Fluid: 121 U/L — ABNORMAL HIGH (ref 3–23)

## 2019-11-12 LAB — AMYLASE, PLEURAL OR PERITONEAL FLUID: Amylase, Fluid: 55 U/L

## 2019-11-12 LAB — APTT: aPTT: 35 seconds (ref 24–36)

## 2019-11-12 LAB — GLUCOSE, PLEURAL OR PERITONEAL FLUID: Glucose, Fluid: 179 mg/dL

## 2019-11-12 LAB — ALBUMIN, PLEURAL OR PERITONEAL FLUID: Albumin, Fluid: 1.7 g/dL

## 2019-11-12 LAB — PROTIME-INR
INR: 1.2 (ref 0.8–1.2)
Prothrombin Time: 14.2 seconds (ref 11.4–15.2)

## 2019-11-12 LAB — BODY FLUID CELL COUNT WITH DIFFERENTIAL
Eos, Fluid: 30 %
Lymphs, Fluid: 28 %
Monocyte-Macrophage-Serous Fluid: 31 % — ABNORMAL LOW (ref 50–90)
Neutrophil Count, Fluid: 9 % (ref 0–25)
Other Cells, Fluid: 2 %
Total Nucleated Cell Count, Fluid: 253 cu mm (ref 0–1000)

## 2019-11-12 IMAGING — US IR THORACENTESIS ASP PLEURAL SPACE W/IMG GUIDE
1 series · 4 of 4 positions shown · non-contrast
Comparison: none

INDICATION: Patient with history of end-stage renal disease on hemodialysis,
congestive heart failure, recent imaging shows right-sided pleural
effusion with possible cavitary lesion. Request to IR for diagnostic
and therapeutic thoracentesis.

[Series 1: ir (id) (id)/(id)/(id) ir · 4 of 4 slices shown]
[im 1/4]
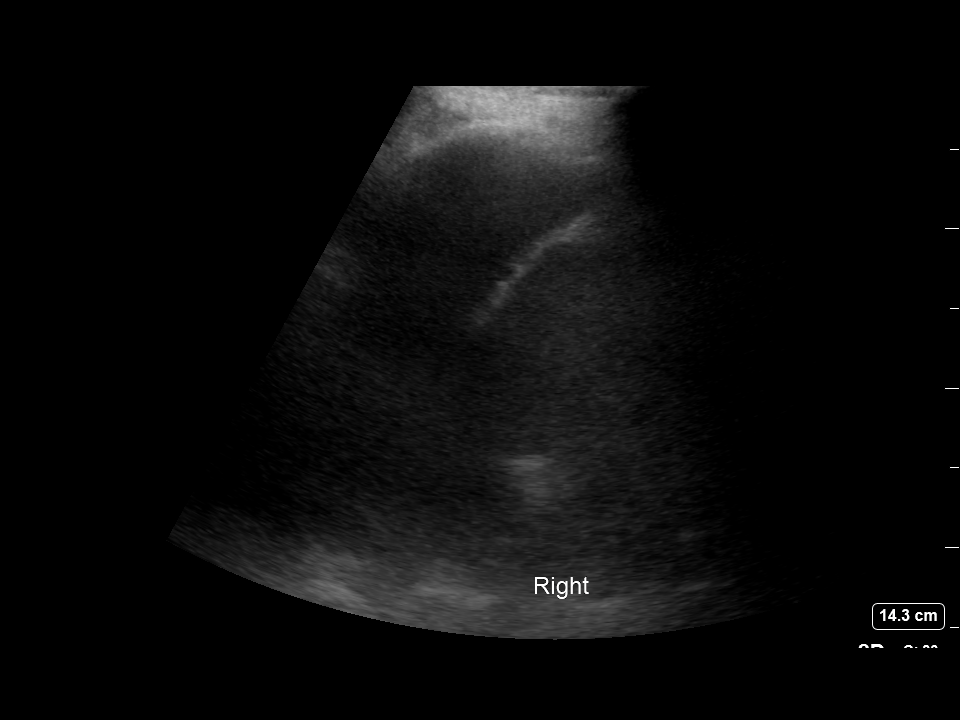
[im 2/4]
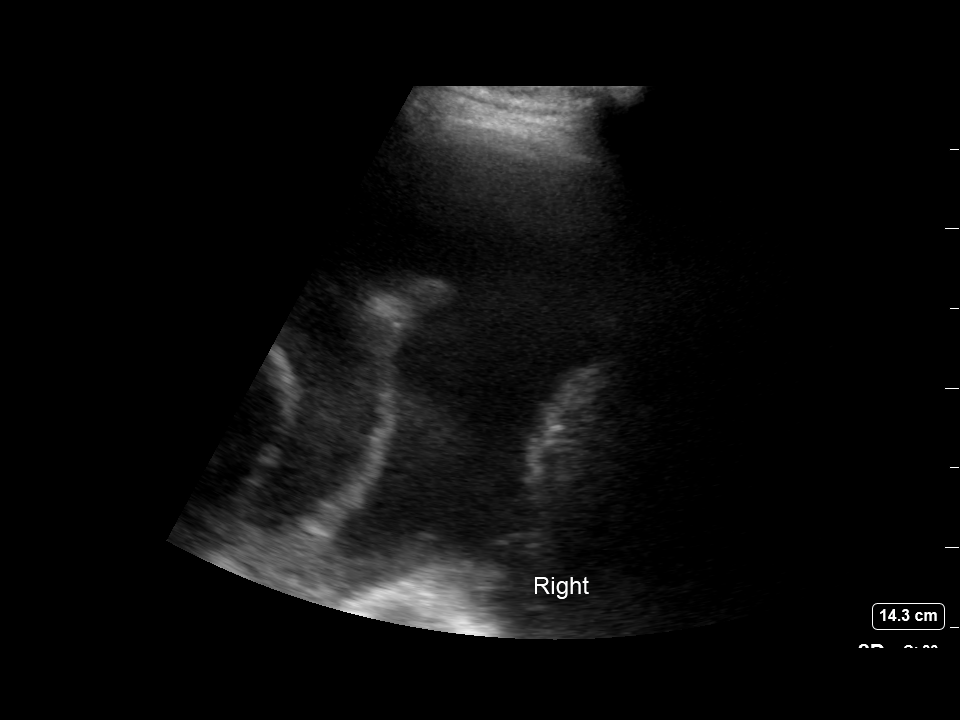
[im 3/4]
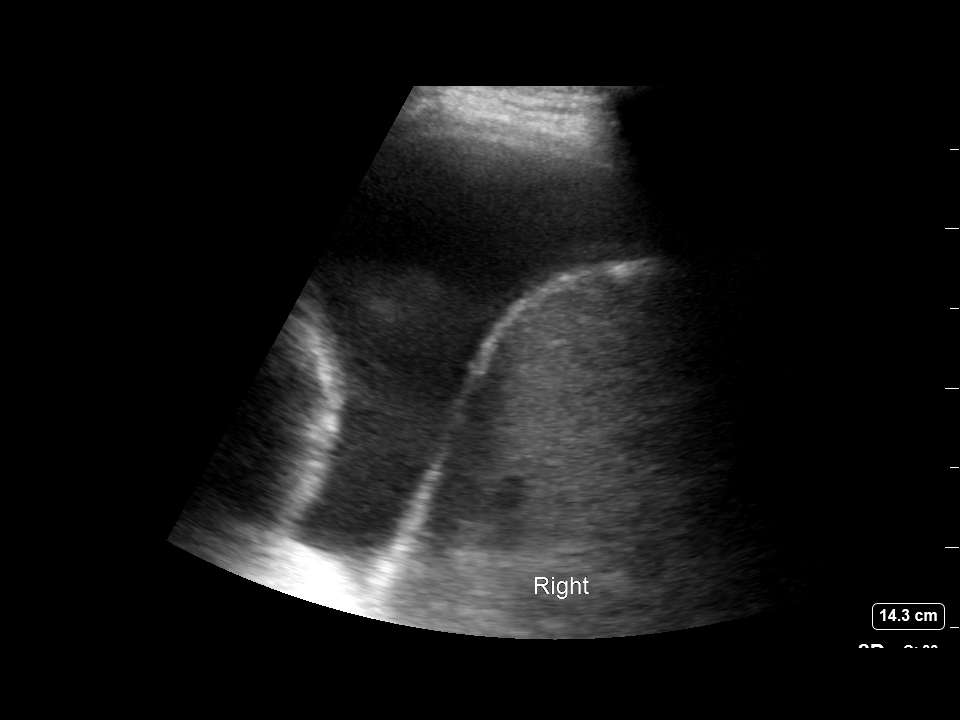
[im 4/4]
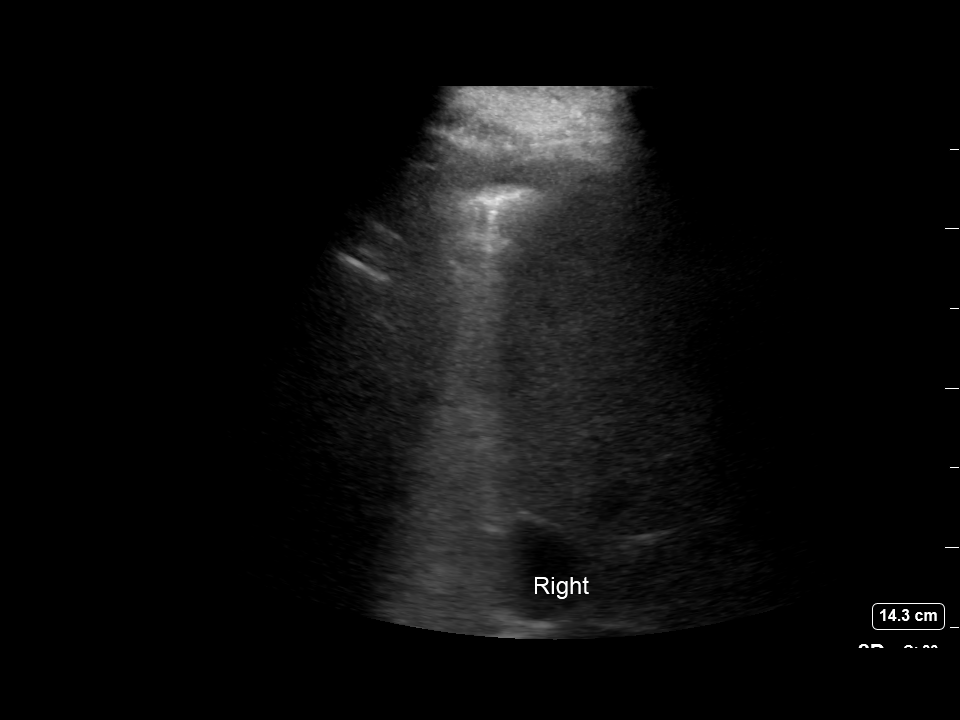

[4 of 4 positions shown; findings below may reference images not displayed]

EXAM:
ULTRASOUND GUIDED RIGHT THORACENTESIS

MEDICATIONS:
5 mL 1% lidocaine

COMPLICATIONS:
None immediate.

PROCEDURE:
An ultrasound guided thoracentesis was thoroughly discussed with the
patient and questions answered. The benefits, risks, alternatives
and complications were also discussed. The patient understands and
wishes to proceed with the procedure. Written consent was obtained.

Ultrasound was performed to localize and mark an adequate pocket of
fluid in the right chest. The area was then prepped and draped in
the normal sterile fashion. 1% Lidocaine was used for local
anesthesia. Under ultrasound guidance a 6 Fr Safe-T-Centesis
catheter was introduced. Thoracentesis was performed. The catheter
was removed and a dressing applied.
FINDINGS: A total of approximately 1.3 L of clear yellow fluid was removed.
Samples were sent to the laboratory as requested by the clinical
team.
IMPRESSION: Successful ultrasound guided right thoracentesis yielding 1.3 L of
pleural fluid.

Read by SHI MA

## 2019-11-12 IMAGING — DX DG CHEST 1V
1 series · 1 of 1 positions shown · non-contrast
Comparison: [DATE]

CLINICAL DATA: Right pleural effusion status post thoracentesis

EXAM:
CHEST  1 VIEW

[chest ap]
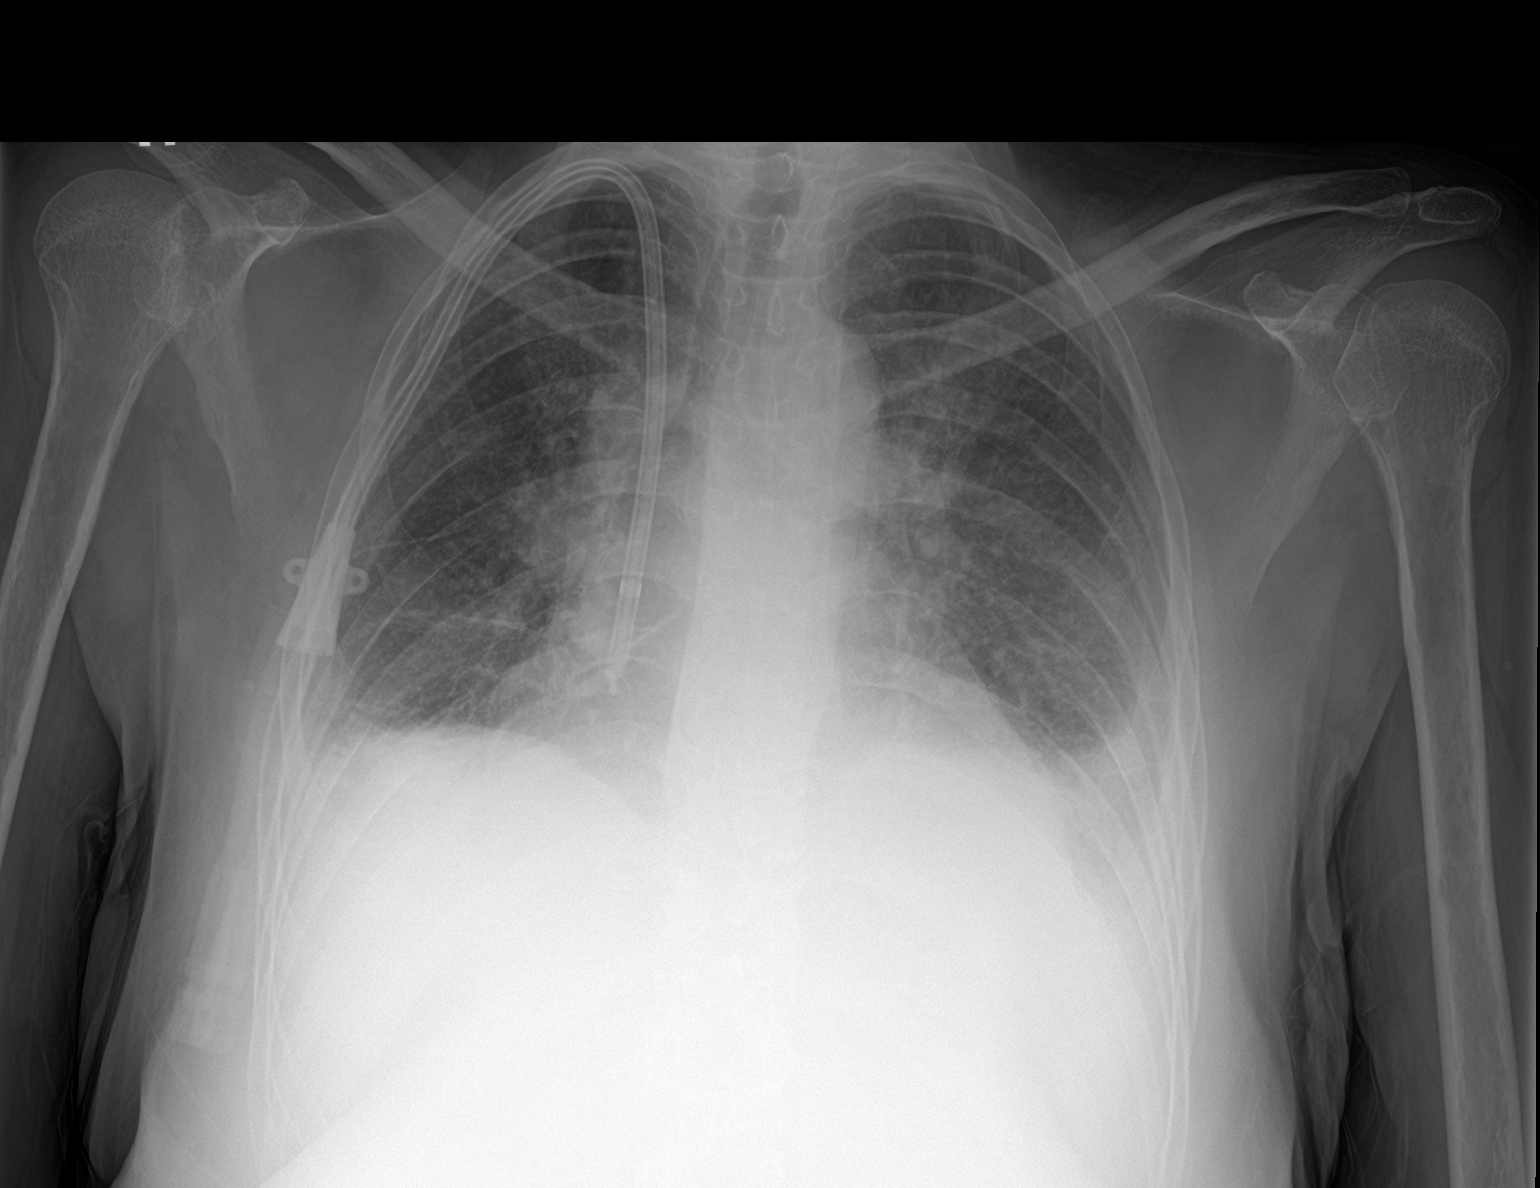

[1 of 1 positions shown; findings below may reference images not displayed]

FINDINGS: Single frontal view of the chest demonstrates stable right internal
jugular dialysis catheter. Cardiac silhouette is enlarged but
stable. Decreased right pleural effusion after thoracentesis with no
evidence of postprocedure pneumothorax. Stable left pleural
effusion. Bibasilar consolidation consistent with atelectasis.
Chronic central vascular congestion unchanged.
IMPRESSION: 1. No complication after right thoracentesis. Minimal residual right
pleural fluid.
2. Persistent bibasilar atelectasis and left pleural effusion.

## 2019-11-12 MED ORDER — LIDOCAINE HCL (PF) 1 % IJ SOLN
INTRAMUSCULAR | Status: AC | PRN
  Administered 2019-11-12: 10 mL

## 2019-11-12 MED ORDER — "THROMBI-PAD 3""X3"" EX PADS"
1.0000 | MEDICATED_PAD | Freq: Once | CUTANEOUS | Status: AC
  Administered 2019-11-13: 1 via TOPICAL
  Filled 2019-11-12: qty 1

## 2019-11-12 MED ORDER — LIDOCAINE HCL 1 % IJ SOLN
INTRAMUSCULAR | Status: AC
  Filled 2019-11-12: qty 20

## 2019-11-12 NOTE — ED Triage Notes (Signed)
Pt c/o bleeding to surgical site, states she had fluid pulled off her lung today. Blood noted to pt's shirt and sweatshirt, bleeding controlled at this time. New gauze placed to wound. Pt denies pain.

## 2019-11-12 NOTE — Telephone Encounter (Signed)
error 

## 2019-11-12 NOTE — Procedures (Signed)
PROCEDURE SUMMARY:  Successful image-guided right thoracentesis. Yielded 1.3 liters of clear yellow fluid. Patient tolerated procedure well. EBL: Zero No immediate complications.  Specimen was sent for labs. Post procedure CXR shows no pneumothorax.  Please see imaging section of Epic for full dictation.  Joaquim Nam PA-C 11/12/2019 2:22 PM

## 2019-11-12 NOTE — Telephone Encounter (Signed)
Patient daughter called and her mother today had fluid drained from her lung area. She states that after getting home the dressing saturated with red blood.  It contiunes as we are speaking. She has back pain. Daughter states that they received no discharge instructions. Per protocol daughter will take her mother back to ER for evaluation. She was instructed to reinforce dressing but do not remove.  She verbalized understanding. Reason for Disposition . Sounds like a serious complication to the triager  Answer Assessment - Initial Assessment Questions 1. SYMPTOM: "What's the main symptom you're concerned about?" (e.g., redness, pain, drainage)     drainage 2. ONSET: "When did drainge start?"    Today post op 3. SURGERY: "What surgery was performed?"    Tapped lung 4. DATE of SURGERY: "When was surgery performed?"      today 5. INCISION SITE: "Where is the incision located?"      *No Answer* 6. REDNESS: "Is there any redness at the incision site?" If yes, ask: "How wide across is the redness?" (Inches, centimeters)      *No Answer* 7. PAIN: "Is there any pain?" If so, ask: "How bad is it?"  (Scale 1-10; or mild, moderate, severe)     *No Answer* 8. BLEEDING: "Is there any bleeding?" If so, ask: "How much?" and "Where?"    Soaked  9. DRAINAGE: "Is there any drainage from the incision site?" If yes, ask: "What color and how much?" (e.g., red, cloudy, pus; drops, teaspoon)    Saturating dressing with blood and fluid and colthing 10. FEVER: "Do you have a fever?" If so, ask: "What is your temperature, how was it measured, and when did it start?"       *No Answer* 11. OTHER SYMPTOMS: "Do you have any other symptoms?" (e.g., shaking chills, weakness, rash elsewhere on body)       Back pain  Protocols used: POST-OP INCISION SYMPTOMS AND QUESTIONS-A-AH

## 2019-11-12 NOTE — ED Provider Notes (Signed)
[facility] EMERGENCY DEPARTMENT Provider Note   CSN: [id number] Arrival date & time: 11/12/19  1748   History Chief Complaint  Patient presents with  . Post-op Problem    Melanie Hall is a 49 y.o. female.  The history is provided by the patient.  She has history of hypertension, diabetes, hyperlipidemia, end-stage renal disease on hemodialysis, stroke, coronary artery disease and comes in with bleeding from the site of thoracentesis done earlier today.  She had 1.3 L of fluid drawn from the right pleural space.  Dressing has been saturated with blood.  She states that it is not painful.  She is on aspirin and ticagrelor but no anticoagulants.  Past Medical History:  Diagnosis Date  . Anemia   . Cataracts, bilateral    surgery to remove  . Diabetes mellitus    type 2  . ESRD on hemodialysis ([place])   . GERD (gastroesophageal reflux disease)   . Hyperlipidemia   . Hypertension   . Stroke ([place])   . SVD (spontaneous vaginal delivery)    x 4    Patient Active Problem List   Diagnosis Date Noted  . Acute systolic heart failure ([place]) 10/21/2019  . STEMI (ST elevation myocardial infarction) ([place]) 10/18/2019  . STEMI involving right coronary artery ([place]) 10/18/2019  . Loose stools   . Acute cystitis without hematuria   . Weakness   . Weakness of both lower extremities   . Debility 09/10/2019  . ESRD (end stage renal disease) on dialysis ([place]) 09/10/2019  . Depression 09/10/2019  . Incontinence in female 09/10/2019  . Pressure ulcer 09/10/2019  . Anemia in chronic kidney disease 08/19/2019  . Accelerated hypertension 08/08/2019  . Right arm weakness 08/08/2019  . MGUS (monoclonal gammopathy of unknown significance) 07/23/2019  . Acute on chronic renal failure ([place]) 02/28/2019  . GERD (gastroesophageal reflux disease) 02/28/2019  . Closed fracture of left femur ([place]) 10/28/2018  . Hypertension associated with diabetes ([place]) 10/28/2018  . Sprain of left  ankle   . Emphysematous cystitis 08/26/2018  . Vaginosis 08/26/2018  . Normocytic anemia 08/26/2018  . Closed fracture of right ankle 11/06/2017  . IRREGULAR MENSES 09/14/2009  . HYPERCHOLESTEROLEMIA 11/19/2008  . PARONYCHIA, RIGHT GREAT TOE 07/30/2008  . Type 2 diabetes with nephropathy ([place]) 07/02/2007  . CATARACTS, BILATERAL 07/02/2007    Past Surgical History:  Procedure Laterality Date  . AV FISTULA PLACEMENT Left 08/17/2019   Procedure: LEFT BRACHIAL CEPHALIC ARTERIOVENOUS (AV) FISTULA;  Surgeon: Angelia Mould, MD;  Location: [facility];  Service: Vascular;  Laterality: Left;  . CORONARY STENT INTERVENTION N/A 10/18/2019   Procedure: CORONARY STENT INTERVENTION;  Surgeon: Sherren Mocha, MD;  Location: [facility];  Service: Cardiovascular;  Laterality: N/A;  . CORONARY/GRAFT ACUTE MI REVASCULARIZATION N/A 10/18/2019   Procedure: Coronary/Graft Acute MI Revascularization;  Surgeon: Sherren Mocha, MD;  Location: [facility];  Service: Cardiovascular;  Laterality: N/A;  . EYE SURGERY Bilateral    cataracts removed  . FEMUR IM NAIL Left 10/28/2018   Procedure: RETROGRADE FEMORAL NAILING;  Surgeon: Meredith Pel, MD;  Location: [facility];  Service: Orthopedics;  Laterality: Left;  . IM NAILING FEMORAL SHAFT RETROGRADE Left 10/28/2018  . INTRAVASCULAR ULTRASOUND/IVUS N/A 10/18/2019   Procedure: Intravascular Ultrasound/IVUS;  Surgeon: Sherren Mocha, MD;  Location: [facility];  Service: Cardiovascular;  Laterality: N/A;  . IR FLUORO GUIDE CV LINE RIGHT  08/11/2019  . IR THORACENTESIS ASP PLEURAL SPACE W/IMG GUIDE  11/12/2019  . IR US GUIDE VASC ACCESS RIGHT  08/11/2019  . KNEE SURGERY Left   . LEFT HEART CATH AND CORONARY ANGIOGRAPHY N/A 10/18/2019   Procedure: LEFT HEART CATH AND CORONARY ANGIOGRAPHY;  Surgeon: Sherren Mocha, MD;  Location: [facility];  Service: Cardiovascular;  Laterality: N/A;  . RADIOLOGY WITH ANESTHESIA N/A 09/15/2019   Procedure:  [facility] AND LUMBER LOWER BACK PAIN;  Surgeon: Radiologist, Medication, MD;  Location: [facility];  Service: Radiology;  Laterality: N/A;  . TUBAL LIGATION       OB History    Gravida  4   Para  4   Term  4   Preterm      AB      Living        SAB      TAB      Ectopic      Multiple      Live Births              Family History  Problem Relation Age of Onset  . Diabetes Mother   . Hypertension Mother   . Bipolar disorder Mother   . Brain cancer Maternal Aunt   . Heart disease Maternal Grandmother   . Diabetes Maternal Grandmother   . Breast cancer Maternal Aunt   . Depression Father        Committed suicide  . Diabetes Sister     Social History   Tobacco Use  . Smoking status: Former Smoker    Packs/day: 0.25    Years: 2.00    Pack years: 0.50    Types: Cigarettes    Quit date: 1997    Years since quitting: 24.3  . Smokeless tobacco: Never Used  Substance Use Topics  . Alcohol use: No  . Drug use: No    Home Medications Prior to Admission medications   Medication Sig Start Date End Date Taking? Authorizing Provider  aspirin EC 81 MG EC tablet Take 1 tablet (81 mg total) by mouth daily. 10/22/19   Cheryln Manly, NP  atorvastatin (LIPITOR) 80 MG tablet Take 1 tablet (80 mg total) by mouth daily at 6 PM. 10/21/19   Cheryln Manly, NP  carvedilol (COREG) 6.25 MG tablet Take 1.5 tablets (9.375 mg total) by mouth 2 (two) times daily. 11/03/19   Richardson Dopp T, PA-C  epoetin alfa (EPOGEN) 3000 UNIT/ML injection Inject 3,000 Units into the skin every 14 (fourteen) days.  06/12/19   [provider]  gabapentin (NEURONTIN) 300 MG capsule Take 1 capsule (300 mg total) by mouth at bedtime. 09/19/19 11/04/19  Antonieta Pert, MD  insulin aspart protamine- aspart (NOVOLOG MIX 70/30) (70-30) 100 UNIT/ML injection Inject 10 Units into the skin 2 (two) times daily with a meal.     [provider]  levothyroxine (SYNTHROID) 25 MCG tablet Take 1 tablet  (25 mcg total) by mouth daily before breakfast. 10/29/19   Soyla Dryer, PA-C  multivitamin (RENA-VIT) TABS tablet Take 1 tablet by mouth at bedtime. 08/25/19   Hoyt Koch, MD  nitroGLYCERIN (NITROSTAT) 0.4 MG SL tablet Place 1 tablet (0.4 mg total) under the tongue every 5 (five) minutes x 3 doses as needed for chest pain. 10/21/19   Cheryln Manly, NP  sertraline (ZOLOFT) 25 MG tablet Take 1 tablet (25 mg total) by mouth daily. 10/29/19 11/28/19  Soyla Dryer, PA-C  sulfamethoxazole-trimethoprim (BACTRIM DS) 800-160 MG tablet Take 1 tablet by mouth every other day. 11/01/19  [provider]  tamsulosin (FLOMAX) 0.4 MG CAPS capsule Take 1 capsule (0.4 mg total) by mouth daily after supper. 10/29/19   Soyla Dryer, PA-C  ticagrelor (BRILINTA) 90 MG TABS tablet Take 1 tablet (90 mg total) by mouth 2 (two) times daily. 10/21/19   Cheryln Manly, NP  zolpidem (AMBIEN) 5 MG tablet Take 1 tablet (5 mg total) by mouth at bedtime as needed for sleep. 10/29/19   Soyla Dryer, PA-C    Allergies    Patient has no known allergies.  Review of Systems   Review of Systems  All other systems reviewed and are negative.   Physical Exam Updated Vital Signs BP (!) 144/101   Pulse 81   Temp 98.3 F (36.8 C)   Resp 18   LMP 12/17/2016   SpO2 93%   Physical Exam Vitals and nursing note reviewed.   49 year old female, resting comfortably and in no acute distress. Vital signs are significant for elevated blood pressure. Oxygen saturation is 93%, which is normal. Head is normocephalic and atraumatic. PERRLA, EOMI. Oropharynx is clear. Neck is nontender and supple without adenopathy or JVD. Back is nontender and there is no CVA tenderness. Lungs are clear without rales, wheezes, or rhonchi. Chest is nontender.  Puncture site for thoracentesis noted in the posterior aspect of the right chest with very slow oozing of blood from puncture site.  Dialysis access catheter is  present right subclavian area. Heart has regular rate and rhythm without murmur. Abdomen is soft, flat, nontender without masses or hepatosplenomegaly and peristalsis is normoactive. Extremities have no cyanosis or edema, full range of motion is present. Skin is warm and dry without rash. Neurologic: Mental status is normal, cranial nerves are intact, there are no motor or sensory deficits.  ED Results / Procedures / Treatments   Labs (all labs ordered are listed, but only abnormal results are displayed) Labs Reviewed - No data to display  Radiology DG Chest 1 View  Result Date: 11/12/2019 CLINICAL DATA:  Right pleural effusion status post thoracentesis EXAM: CHEST  1 VIEW COMPARISON:  11/03/2019 FINDINGS: Single frontal view of the chest demonstrates stable right internal jugular dialysis catheter. Cardiac silhouette is enlarged but stable. Decreased right pleural effusion after thoracentesis with no evidence of postprocedure pneumothorax. Stable left pleural effusion. Bibasilar consolidation consistent with atelectasis. Chronic central vascular congestion unchanged. IMPRESSION: 1. No complication after right thoracentesis. Minimal residual right pleural fluid. 2. Persistent bibasilar atelectasis and left pleural effusion. Electronically Signed   By: Randa Ngo M.D.   On: 11/12/2019 16:47   IR THORACENTESIS ASP PLEURAL SPACE W/IMG GUIDE  Result Date: 11/12/2019 INDICATION: Patient with history of end-stage renal disease on hemodialysis, congestive heart failure, recent imaging shows right-sided pleural effusion with possible cavitary lesion. Request to IR for diagnostic and therapeutic thoracentesis. EXAM: ULTRASOUND GUIDED RIGHT THORACENTESIS MEDICATIONS: 5 mL 1% lidocaine COMPLICATIONS: None immediate. PROCEDURE: An ultrasound guided thoracentesis was thoroughly discussed with the patient and questions answered. The benefits, risks, alternatives and complications were also discussed. The  patient understands and wishes to proceed with the procedure. Written consent was obtained. Ultrasound was performed to localize and mark an adequate pocket of fluid in the right chest. The area was then prepped and draped in the normal sterile fashion. 1% Lidocaine was used for local anesthesia. Under ultrasound guidance a 6 Fr Safe-T-Centesis catheter was introduced. Thoracentesis was performed. The catheter was removed and a dressing applied. FINDINGS: A total of approximately 1.3 L of  clear yellow fluid was removed. Samples were sent to the laboratory as requested by the clinical team. IMPRESSION: Successful ultrasound guided right thoracentesis yielding 1.3 L of pleural fluid. Read by Candiss Norse, PA-C Electronically Signed   By: Jacqulynn Cadet M.D.   On: 11/12/2019 14:24    Procedures Procedures  Bleeding site was anesthetized with 2% lidocaine with epinephrine and a single figure-of-eight stitch was placed with 4-0 nylon.  Following this, bleeding from the puncture site stopped.  Patient tolerated procedure well.  Medications Ordered in ED Medications  Thrombi-Pad 3"X3" pad 1 each (has no administration in time range)  lidocaine-EPINEPHrine (XYLOCAINE W/EPI) 2 %-1:200000 (PF) injection 10 mL (has no administration in time range)    ED Course  I have reviewed the triage vital signs and the nursing notes.  Pertinent labs & imaging results that were available during my care of the patient were reviewed by me and considered in my medical decision making (see chart for details).  MDM Rules/Calculators/A&P Bleeding from site of right thoracentesis.  Will check screening labs including coagulation studies, try topical thrombin.  Old records are reviewed confirming got thoracentesis done earlier today with removal of 1.3 L of pleural fluid.  Labs showed anemia of renal failure, normal platelet count, normal coagulation studies.  Thrombin pad was applied, but oozing of blood persisted.  A  figure-of-eight stitch was placed with successful cessation of bleeding.  As patient was being prepared for discharge, she started telling me of pain she has had at the site of her tunneled catheter for dialysis and also pain in the left lateral rib cage.  The rib cage pain has been present for over a month and is constant.  She states that they have noted some redness at the site of her dialysis catheter.  I have looked at the site and there is no erythema or swelling or drainage that I can see.  She does demonstrate some mild tenderness in the left lateral chest wall but no rash.  She did have chest x-ray post procedure and there were no changes on the left side of the lung.  She is advised to follow-up with her medical team regarding these complaints.  Told that the suture should be removed in 2-5 days and recommended that she have it done at her dialysis session in 3 days.  Final Clinical Impression(s) / ED Diagnoses Final diagnoses:  Complication of procedure, initial encounter  Bleeding  End-stage renal disease on hemodialysis ([place])  Anemia associated with chronic renal failure    Rx / DC Orders ED Discharge Orders    None       Delora Fuel, MD 22/63/33 [phone]

## 2019-11-12 NOTE — Progress Notes (Signed)
Chart reviewed with Dr. Ola Spurr and per Anesthesia guidelines, patient is not a candidate for outpatient surgery at [facility]. Dr. Redmond Baseman office notified.

## 2019-11-12 NOTE — Telephone Encounter (Signed)
   [facility] Pre-operative Risk Assessment    Request for surgical clearance:  1. What type of surgery is being performed? CLOSED NASAL REDUCTION  2. When is this surgery scheduled? 11/16/19   3. What type of clearance is required (medical clearance vs. Pharmacy clearance to hold med vs. Both)? BOTH  4. Are there any medications that need to be held prior to surgery and how long? ASA 81 MG AND BRILINTA 90 MG   5. Practice name and name of physician performing surgery? [facility] [place]; DR. DWIGHT BATES   6. What is your office phone number [phone]    7.   What is your office fax number [phone]  8.   Anesthesia type (None, local, MAC, general) ? NONE LISTED   Jacinta Shoe 11/12/2019, 3:05 PM  _________________________________________________________________   (provider comments below)

## 2019-11-13 ENCOUNTER — Encounter (HOSPITAL_COMMUNITY): Payer: Self-pay | Admitting: Certified Registered"

## 2019-11-13 DIAGNOSIS — N186 End stage renal disease: Secondary | ICD-10-CM | POA: Diagnosis not present

## 2019-11-13 DIAGNOSIS — Z992 Dependence on renal dialysis: Secondary | ICD-10-CM | POA: Diagnosis not present

## 2019-11-13 DIAGNOSIS — D509 Iron deficiency anemia, unspecified: Secondary | ICD-10-CM | POA: Diagnosis not present

## 2019-11-13 DIAGNOSIS — D631 Anemia in chronic kidney disease: Secondary | ICD-10-CM | POA: Diagnosis not present

## 2019-11-13 LAB — GRAM STAIN

## 2019-11-13 LAB — ACID FAST SMEAR (AFB, MYCOBACTERIA): Acid Fast Smear: NEGATIVE

## 2019-11-13 LAB — PH, BODY FLUID: pH, Body Fluid: 7.6

## 2019-11-13 MED ORDER — LIDOCAINE-EPINEPHRINE (PF) 2 %-1:200000 IJ SOLN
10.0000 mL | Freq: Once | INTRAMUSCULAR | Status: AC
  Administered 2019-11-13: 10 mL
  Filled 2019-11-13: qty 20

## 2019-11-13 NOTE — Discharge Instructions (Addendum)
Have the sutures removed in 2-5 days. You can have the dialysis team do that.  Return if you are having any problems.

## 2019-11-13 NOTE — Telephone Encounter (Signed)
   Primary Cardiologist: Sherren Mocha, MD  Chart reviewed as part of pre-operative protocol coverage. Pt recently underwent angiography with placement of DES x 2 to RCA on 10/18/19, which required DAPT with ASA and brilinta for 12 months.   I have reached out to the requesting office and left a VM to gather more information regarding the urgency of the surgery and need to ask permission to interrupt DAPT prior to 12 months.  Awaiting callback.   Ledora Bottcher, PA-C 11/13/2019, 10:39 AM [place], [place] [address]               [place], [place] 11/13/2019, 10:32 AM

## 2019-11-16 ENCOUNTER — Ambulatory Visit (HOSPITAL_BASED_OUTPATIENT_CLINIC_OR_DEPARTMENT_OTHER): Admission: RE | Admit: 2019-11-16 | Payer: MEDICAID | Source: Home / Self Care | Admitting: Otolaryngology

## 2019-11-16 ENCOUNTER — Encounter (HOSPITAL_BASED_OUTPATIENT_CLINIC_OR_DEPARTMENT_OTHER): Admission: RE | Payer: Self-pay | Source: Home / Self Care

## 2019-11-16 DIAGNOSIS — D631 Anemia in chronic kidney disease: Secondary | ICD-10-CM | POA: Diagnosis not present

## 2019-11-16 DIAGNOSIS — Z7689 Persons encountering health services in other specified circumstances: Secondary | ICD-10-CM | POA: Diagnosis not present

## 2019-11-16 DIAGNOSIS — D509 Iron deficiency anemia, unspecified: Secondary | ICD-10-CM | POA: Diagnosis not present

## 2019-11-16 DIAGNOSIS — N186 End stage renal disease: Secondary | ICD-10-CM | POA: Diagnosis not present

## 2019-11-16 DIAGNOSIS — Z992 Dependence on renal dialysis: Secondary | ICD-10-CM | POA: Diagnosis not present

## 2019-11-16 SURGERY — CLOSED REDUCTION, FRACTURE, NASAL BONE
Anesthesia: General

## 2019-11-16 NOTE — Telephone Encounter (Signed)
   Primary Cardiologist: Sherren Mocha, MD  Chart reviewed as part of pre-operative protocol coverage I am covering today.   I see procedure is now listed as cancelled (was originally scheduled for today).  Callback, can you please call surgeon's office to find out surgical plans as well as nature of urgency?  Will also route to Dr. Burt Knack for input on holding if deemed necessary. Dr. Burt Knack - Please route response to P CV DIV PREOP (the pre-op pool). Thank you.  Charlie Pitter, PA-C 11/16/2019, 9:01 AM

## 2019-11-16 NOTE — Telephone Encounter (Signed)
Attempted to contact requesting office in regards to surgery. No answer. Lvm

## 2019-11-17 LAB — CULTURE, BODY FLUID W GRAM STAIN -BOTTLE: Culture: NO GROWTH

## 2019-11-17 LAB — CYTOLOGY - NON PAP

## 2019-11-17 NOTE — Telephone Encounter (Signed)
Can we see if they have any update? See notes below.

## 2019-11-17 NOTE — Telephone Encounter (Signed)
Left message for ENT office to please call our office to confirm if pt's surgery is planned and if so the urgency of her surgery. See previous notes.

## 2019-11-18 DIAGNOSIS — N2581 Secondary hyperparathyroidism of renal origin: Secondary | ICD-10-CM | POA: Diagnosis not present

## 2019-11-18 DIAGNOSIS — N186 End stage renal disease: Secondary | ICD-10-CM | POA: Diagnosis not present

## 2019-11-18 DIAGNOSIS — D509 Iron deficiency anemia, unspecified: Secondary | ICD-10-CM | POA: Diagnosis not present

## 2019-11-18 DIAGNOSIS — D631 Anemia in chronic kidney disease: Secondary | ICD-10-CM | POA: Diagnosis not present

## 2019-11-18 DIAGNOSIS — Z7689 Persons encountering health services in other specified circumstances: Secondary | ICD-10-CM | POA: Diagnosis not present

## 2019-11-18 DIAGNOSIS — Z992 Dependence on renal dialysis: Secondary | ICD-10-CM | POA: Diagnosis not present

## 2019-11-18 NOTE — Telephone Encounter (Signed)
I returned call to [facility] with the ENT office. Left message to please call back in regards to surgery. See previous notes.

## 2019-11-18 NOTE — Telephone Encounter (Signed)
Lattie Haw states surgery is cancelled.

## 2019-11-18 NOTE — Telephone Encounter (Signed)
Left message asked to confirm her surgery is cancelled.  Kerin Ransom PA-C 11/18/2019 10:48 AM

## 2019-11-18 NOTE — Telephone Encounter (Signed)
Lattie Haw from ENT is returning Carol's call. Please advise.

## 2019-11-19 ENCOUNTER — Ambulatory Visit (HOSPITAL_COMMUNITY)
Admission: RE | Admit: 2019-11-19 | Discharge: 2019-11-19 | Disposition: A | Discharge status: Home / Self Care | Payer: Medicaid Other | Source: Ambulatory Visit | Attending: Vascular Surgery | Admitting: Vascular Surgery

## 2019-11-19 ENCOUNTER — Ambulatory Visit (INDEPENDENT_AMBULATORY_CARE_PROVIDER_SITE_OTHER): Payer: Medicaid Other | Admitting: Physician Assistant

## 2019-11-19 ENCOUNTER — Encounter (HOSPITAL_COMMUNITY): Payer: Self-pay

## 2019-11-19 ENCOUNTER — Other Ambulatory Visit: Payer: Self-pay

## 2019-11-19 VITALS — BP 117/70 | HR 82 | Temp 96.7°F | Resp 14 | Ht 62.5 in | Wt 137.6 lb

## 2019-11-19 DIAGNOSIS — Z992 Dependence on renal dialysis: Secondary | ICD-10-CM

## 2019-11-19 DIAGNOSIS — N186 End stage renal disease: Secondary | ICD-10-CM | POA: Diagnosis not present

## 2019-11-19 NOTE — Progress Notes (Signed)
POST OPERATIVE OFFICE NOTE    CC:  F/u for surgery  HPI:  This is a 49 y.o. female who is s/p left BC AV fistula 08/17/19 by Dr. Scot Dock.  She is here for f/u visit.  Prior to hospital discharge she had a chief complaint of left hand numbness.  On exam she had Despite numbness; she is perfusing L hand with palpable L radial pulse and palmer arch signal by doppler without neurologic deficit.    The fistula was accessed for the first tim yesterday and it was infiltrated.  They were unable to use it for access. She has a functioning TDC.  On 10/18/19 she had cardiac cath for acute MI and drug-eluting stent placement x 2.  She is on Brilinta currently.  No Known Allergies  Current Outpatient Medications  Medication Sig Dispense Refill  . aspirin EC 81 MG EC tablet Take 1 tablet (81 mg total) by mouth daily. 90 tablet 1  . atorvastatin (LIPITOR) 80 MG tablet Take 1 tablet (80 mg total) by mouth daily at 6 PM. 90 tablet 1  . carvedilol (COREG) 6.25 MG tablet Take 1.5 tablets (9.375 mg total) by mouth 2 (two) times daily. 180 tablet 3  . epoetin alfa (EPOGEN) 3000 UNIT/ML injection Inject 3,000 Units into the skin every 14 (fourteen) days.     Marland Kitchen gabapentin (NEURONTIN) 300 MG capsule Take 1 capsule (300 mg total) by mouth at bedtime. 30 capsule 0  . insulin aspart protamine- aspart (NOVOLOG MIX 70/30) (70-30) 100 UNIT/ML injection Inject 10 Units into the skin 2 (two) times daily with a meal.     . levothyroxine (SYNTHROID) 25 MCG tablet Take 1 tablet (25 mcg total) by mouth daily before breakfast. 30 tablet 1  . multivitamin (RENA-VIT) TABS tablet Take 1 tablet by mouth at bedtime. 30 tablet 0  . nitroGLYCERIN (NITROSTAT) 0.4 MG SL tablet Place 1 tablet (0.4 mg total) under the tongue every 5 (five) minutes x 3 doses as needed for chest pain. 25 tablet 1  . sertraline (ZOLOFT) 25 MG tablet Take 1 tablet (25 mg total) by mouth daily. 30 tablet 0  . tamsulosin (FLOMAX) 0.4 MG CAPS capsule Take 1 capsule  (0.4 mg total) by mouth daily after supper. 30 capsule 0  . ticagrelor (BRILINTA) 90 MG TABS tablet Take 1 tablet (90 mg total) by mouth 2 (two) times daily. 180 tablet 2  . zolpidem (AMBIEN) 5 MG tablet Take 1 tablet (5 mg total) by mouth at bedtime as needed for sleep. 15 tablet 0   No current facility-administered medications for this visit.     ROS:  See HPI  Physical Exam:  Findings:  +--------------------+----------+-----------------+--------+  AVF         PSV (cm/s)Flow Vol (mL/min)Comments  +--------------------+----------+-----------------+--------+  Native artery inflow  326     1026          +--------------------+----------+-----------------+--------+  AVF Anastomosis     675                 +--------------------+----------+-----------------+--------+     +------------+----------+-------------+----------+----------------+  OUTFLOW VEINPSV (cm/s)Diameter (cm)Depth (cm)  Describe    +------------+----------+-------------+----------+----------------+  Prox UA     218    0.84     0.30  competing branch  +------------+----------+-------------+----------+----------------+  Mid UA     580    0.41     0.27  competing branch  +------------+----------+-------------+----------+----------------+  Dist UA     203    0.50     0.47  competing branch  +------------+----------+-------------+----------+----------------+  AC Fossa    169    0.79     0.27  competing branch  +------------+----------+-------------+----------+----------------+   Small hematoma noted at area of antecubital fossa bruising measuring 0.76  x 0.26 x 0.53 cm.    Summary:  Patent arteriovenous fistula.   Incision:  Well healed incision, with superior area of infiltration and ecchymosis. Extremities:  Palpable thrill in the fistula, not easily palpable fistula in the central area  of the fistula. Heart: RRR Lungs non labored breathing  Abdomen:  Soft + BS  Assessment/Plan:  This is a 49 y.o. female who is s/p:left BC AV fistula creation by Dr. Scot Dock 08/17/2019  The fistula has had enough time to mature, how ever on first use it was infiltrated.  The duplex revealed multiple competing branches and the central diameter of the fistula is not large enough .  She had recent cardiac cath with drug-eluting stent placement x 2.  She is now on Brilinta.  She has a working [facility] and the fistula is open with palpable distal and proximal thrill.  We will ask if she can be off Brilinta and placed on Plavix for fistula intervention of branch ligation with or without fistulogram.    I will discuss the case with Dr. Scot Dock for further intervention advise.   Roxy Horseman PA-C [facility] [phone]  Clinic MD:  Scot Dock

## 2019-11-19 NOTE — Telephone Encounter (Signed)
Agree thanks. Patient is not a candidate for holding antiplatelet Rx unless surgery is extremely urgent/emergent.

## 2019-11-20 ENCOUNTER — Encounter: Payer: Self-pay | Admitting: Physician Assistant

## 2019-11-20 DIAGNOSIS — Z7689 Persons encountering health services in other specified circumstances: Secondary | ICD-10-CM | POA: Diagnosis not present

## 2019-11-20 DIAGNOSIS — Z992 Dependence on renal dialysis: Secondary | ICD-10-CM | POA: Diagnosis not present

## 2019-11-20 DIAGNOSIS — D509 Iron deficiency anemia, unspecified: Secondary | ICD-10-CM | POA: Diagnosis not present

## 2019-11-20 DIAGNOSIS — D631 Anemia in chronic kidney disease: Secondary | ICD-10-CM | POA: Diagnosis not present

## 2019-11-20 DIAGNOSIS — N186 End stage renal disease: Secondary | ICD-10-CM | POA: Diagnosis not present

## 2019-11-23 DIAGNOSIS — Z992 Dependence on renal dialysis: Secondary | ICD-10-CM | POA: Diagnosis not present

## 2019-11-23 DIAGNOSIS — N186 End stage renal disease: Secondary | ICD-10-CM | POA: Diagnosis not present

## 2019-11-23 DIAGNOSIS — D631 Anemia in chronic kidney disease: Secondary | ICD-10-CM | POA: Diagnosis not present

## 2019-11-23 DIAGNOSIS — D509 Iron deficiency anemia, unspecified: Secondary | ICD-10-CM | POA: Diagnosis not present

## 2019-11-23 NOTE — Progress Notes (Signed)
Cardiology Office Note:    Date:  11/24/2019   ID:  Melanie Hall, DOB 25-Apr-1971, MRN [id number]  PCP:  Patient, No Pcp Per  Cardiologist:  Sherren Mocha, MD   Electrophysiologist:  None   Referring MD: Soyla Dryer, PA-C   Chief Complaint:  Follow-up (CAD, CHF)    Patient Profile:    Melanie Hall is a 49 y.o. female with:   Coronary artery disease  ? S/p late presenting Inf STEMI 10/2019 >> PCI: DES x 2 to RCA  Chronic systolic CHF ? Ischemic cardiomyopathy  ? Echocardiogram 10/19/2019: EF 40-45, inf WMA, Gr 1 DD, Lg L pl Eff ? R Pleural Eff >> s/p tap (-1.3 L) 11/2019  ESRD  Diabetes mellitus  Hypertension   Hyperlipidemia   Prior CV studies: Limited Echocardiogram 10/19/2019 EF 40-45, inferior septum, inferior wall and posterior wall hypokinetic, mild conc LVH, Gr 1 DD, Large L pleural Eff, mild MR  Cardiac catheterization 10/18/2019 LAD irregularities LCx irregularities RCA mid 50, distal 95 (thrombotic) Inferior scar, EF 35, moderate MR PCI: 3.5 x 18 mm Resolute Onyx DES x2 to the RCA  Echocardiogram 10/13/2019 EF 60-65, normal RV SF, moderately elevated PASP (RVSP 43.9), trivial MR  Echocardiogram 08/14/2019 Small pericardial effusion without tamponade physiology, EF 55-60, moderate LVH, RVSP 32.9  Echocardiogram 08/09/2019 EF 65-70  History of Present Illness:    Melanie Hall was last seen in clinic 11/03/2019 for post hospitalization follow up.  She had been admitted with a late presentation inferior STEMI that was tx with DES x 2 to the RCA.  EF was 40-45.  She was still having issues with shortness of breath and we noted hypoxia on exam.  I set her up for O2 and a CXR confirmed a large R pleural effusion.  I had her set up for a thoracentesis with IR.  This yielded 1.3 L of clear yellow fluid.  All fluid analyses thus far confirm a transudate (likely from CHF).  Cytology was notable for reactive mesothelial cells.  I referred her to Pulmonology, but she  has not seen them yet.  Of note, she did fall and fractured her nose.  She has seen ENT.  Surgical reduction had been recommended, but she is unable to hold antiplatelet Rx due to recent ACS and PCI.  The procedure has been canceled.    She returns for follow up.  She is here with her daughter.  Her breathing is improved.  She has not had syncope, leg swelling.  She is getting to her dry weight at dialysis.  She has some atypical chest pain from time to time but no recurrent angina.       Past Medical History:  Diagnosis Date  . Anemia   . Cataracts, bilateral    surgery to remove  . Diabetes mellitus    type 2  . ESRD on hemodialysis ([facility])   . GERD (gastroesophageal reflux disease)   . Hyperlipidemia   . Hypertension   . Stroke ([place])   . SVD (spontaneous vaginal delivery)    x 4    Current Medications: Current Meds  Medication Sig  . aspirin 81 MG EC tablet Take 1 tablet (81 mg total) by mouth daily.  Marland Kitchen atorvastatin (LIPITOR) 80 MG tablet Take 1 tablet (80 mg total) by mouth daily at 6 PM.  . carvedilol (COREG) 6.25 MG tablet Take 1.5 tablets (9.375 mg total) by mouth 2 (two) times daily.  Marland Kitchen epoetin alfa (EPOGEN) 3000 UNIT/ML injection  Inject 3,000 Units into the skin every 14 (fourteen) days.   Marland Kitchen gabapentin (NEURONTIN) 300 MG capsule Take 1 capsule (300 mg total) by mouth at bedtime.  . insulin aspart protamine- aspart (NOVOLOG MIX 70/30) (70-30) 100 UNIT/ML injection Inject 10 Units into the skin 2 (two) times daily with a meal.   . levothyroxine (SYNTHROID) 25 MCG tablet Take 1 tablet (25 mcg total) by mouth daily before breakfast.  . multivitamin (RENA-VIT) TABS tablet Take 1 tablet by mouth at bedtime.  . nitroGLYCERIN (NITROSTAT) 0.4 MG SL tablet Place 1 tablet (0.4 mg total) under the tongue every 5 (five) minutes x 3 doses as needed for chest pain.  Marland Kitchen sertraline (ZOLOFT) 25 MG tablet Take 1 tablet (25 mg total) by mouth daily.  . tamsulosin (FLOMAX) 0.4 MG CAPS capsule Take  1 capsule (0.4 mg total) by mouth daily after supper.  . ticagrelor (BRILINTA) 90 MG TABS tablet Take 1 tablet (90 mg total) by mouth 2 (two) times daily.  Marland Kitchen zolpidem (AMBIEN) 5 MG tablet Take 1 tablet (5 mg total) by mouth at bedtime as needed for sleep.  . [DISCONTINUED] aspirin EC 81 MG EC tablet Take 1 tablet (81 mg total) by mouth daily.  . [DISCONTINUED] atorvastatin (LIPITOR) 80 MG tablet Take 1 tablet (80 mg total) by mouth daily at 6 PM.  . [DISCONTINUED] carvedilol (COREG) 6.25 MG tablet Take 1.5 tablets (9.375 mg total) by mouth 2 (two) times daily.  . [DISCONTINUED] nitroGLYCERIN (NITROSTAT) 0.4 MG SL tablet Place 1 tablet (0.4 mg total) under the tongue every 5 (five) minutes x 3 doses as needed for chest pain.  . [DISCONTINUED] ticagrelor (BRILINTA) 90 MG TABS tablet Take 1 tablet (90 mg total) by mouth 2 (two) times daily.     Allergies:   Patient has no known allergies.   Social History   Tobacco Use  . Smoking status: Former Smoker    Packs/day: 0.25    Years: 2.00    Pack years: 0.50    Types: Cigarettes    Quit date: 1997    Years since quitting: 24.3  . Smokeless tobacco: Never Used  Substance Use Topics  . Alcohol use: No  . Drug use: No     Family Hx: The patient's family history includes Bipolar disorder in her mother; Brain cancer in her maternal aunt; Breast cancer in her maternal aunt; Depression in her father; Diabetes in her maternal grandmother, mother, and sister; Heart disease in her maternal grandmother; Hypertension in her mother.  ROS   EKGs/Labs/Other Test Reviewed:    EKG:  EKG is not ordered today.  The ekg ordered today demonstrates n/a  Recent Labs: 08/08/2019: B Natriuretic Peptide 279.0 08/24/2019: Magnesium 1.8 09/10/2019: ALT 17 10/20/2019: TSH 5.115 11/12/2019: BUN 35; Creatinine, Ser 4.27; Hemoglobin 11.0; Platelets 186; Potassium 5.1; Sodium 136   Recent Lipid Panel Lab Results  Component Value Date/Time   CHOL 95 10/19/2019 12:34  AM   TRIG 136 10/19/2019 12:34 AM   HDL 28 (L) 10/19/2019 12:34 AM   CHOLHDL 3.4 10/19/2019 12:34 AM   LDLCALC 40 10/19/2019 12:34 AM    Physical Exam:    VS:  BP (!) 150/64   Pulse 80   Ht 5\' 2"  (1.575 m)   Wt 138 lb (62.6 kg)   LMP 12/17/2016   SpO2 93%   BMI 25.24 kg/m     Wt Readings from Last 3 Encounters:  11/24/19 138 lb (62.6 kg)  11/19/19 137 lb 9.1 oz (  62.4 kg)  11/03/19 149 lb 1.9 oz (67.6 kg)     Constitutional:      Appearance: Not in distress. Chronically ill-appearing.  Neck:     Thyroid: No thyromegaly.  Pulmonary:     Breath sounds: No wheezing. No rales.     Comments: Decreased breath sounds at R base; o/w good air movement Cardiovascular:     Regular rhythm. Normal S1. Normal S2.     Murmurs: There is no murmur.  Edema:    Peripheral edema absent.  Abdominal:     Palpations: Abdomen is soft.  Musculoskeletal:     Cervical back: Neck supple. Skin:    General: Skin is warm and dry.  Neurological:     General: No focal deficit present.     Mental Status: Alert and oriented to person, place and time.     Cranial Nerves: Cranial nerves are intact.       ASSESSMENT & PLAN:    1. Coronary artery disease involving native coronary artery of native heart without angina pectoris She is status post recent inferior ST elevation myocardial infarction treated with DES x2 to the RCA.  She had no significant disease elsewhere.  she does have some atypical chest pains at times but no recurrent angina.  Continue ASA, Ticagrelor, Carvedilol, Atorvastatin.   FU with Dr. Burt Knack or me in 6-8 weeks.   2. Chronic systolic CHF (congestive heart failure) (HCC) EF 40-45 by echocardiogram post MI. Ischemic CM.  NYHA IIb.  Volume status seems stable.  Continue Carvedilol.  I think her BP can tolerate further adjustments in CHF Rx.  Start Hydralazine 10 mg twice daily.  Consider Isosorbide MN at follow up.    3. Pleural effusion on right S/p thoracentesis with removal of  1.3 L of fluid.  Analysis is c/w transudate and cytology is neg for malignant cells.  This was likely all related to CHF in the setting of ESRD.  Her referral to pulmonology was canceled.  If she has a recurrent effusion, we will need to refer to pulmonology.    4. ESRD (end stage renal disease) on dialysis [facility]) She continues on Mon, Wed, Fri dialysis.    5. Mixed hyperlipidemia Continue high intensity statin.  Follow Lipids and LFTs pending.      Dispo:  Return in about 8 weeks (around 01/19/2020) for Routine Follow Up, w/ Dr. Burt Knack, or Richardson Dopp, PA-C, in person.   Medication Adjustments/Labs and Tests Ordered: Current medicines are reviewed at length with the patient today.  Concerns regarding medicines are outlined above.  Tests Ordered: No orders of the defined types were placed in this encounter.  Medication Changes: Meds ordered this encounter  Medications  . aspirin 81 MG EC tablet    Sig: Take 1 tablet (81 mg total) by mouth daily.    Dispense:  90 tablet    Refill:  3  . atorvastatin (LIPITOR) 80 MG tablet    Sig: Take 1 tablet (80 mg total) by mouth daily at 6 PM.    Dispense:  90 tablet    Refill:  3  . carvedilol (COREG) 6.25 MG tablet    Sig: Take 1.5 tablets (9.375 mg total) by mouth 2 (two) times daily.    Dispense:  180 tablet    Refill:  3  . nitroGLYCERIN (NITROSTAT) 0.4 MG SL tablet    Sig: Place 1 tablet (0.4 mg total) under the tongue every 5 (five) minutes x 3 doses as needed for  chest pain.    Dispense:  25 tablet    Refill:  3  . ticagrelor (BRILINTA) 90 MG TABS tablet    Sig: Take 1 tablet (90 mg total) by mouth 2 (two) times daily.    Dispense:  180 tablet    Refill:  3  . hydrALAZINE (APRESOLINE) 10 MG tablet    Sig: Take 1 tablet (10 mg total) by mouth in the morning and at bedtime.    Dispense:  180 tablet    Refill:  3    Signed, Richardson Dopp, PA-C  11/24/2019 1:10 PM    [facility] [address],  [place], [place]  [address] Phone: [phone]; Fax: [phone]

## 2019-11-24 ENCOUNTER — Ambulatory Visit (INDEPENDENT_AMBULATORY_CARE_PROVIDER_SITE_OTHER): Payer: Medicaid Other | Admitting: Physician Assistant

## 2019-11-24 ENCOUNTER — Encounter: Payer: Self-pay | Admitting: Physician Assistant

## 2019-11-24 ENCOUNTER — Other Ambulatory Visit: Payer: Self-pay

## 2019-11-24 VITALS — BP 150/64 | HR 80 | Ht 62.0 in | Wt 138.0 lb

## 2019-11-24 DIAGNOSIS — J90 Pleural effusion, not elsewhere classified: Secondary | ICD-10-CM

## 2019-11-24 DIAGNOSIS — N186 End stage renal disease: Secondary | ICD-10-CM

## 2019-11-24 DIAGNOSIS — I2510 Atherosclerotic heart disease of native coronary artery without angina pectoris: Secondary | ICD-10-CM

## 2019-11-24 DIAGNOSIS — I5022 Chronic systolic (congestive) heart failure: Secondary | ICD-10-CM

## 2019-11-24 DIAGNOSIS — Z992 Dependence on renal dialysis: Secondary | ICD-10-CM | POA: Diagnosis not present

## 2019-11-24 DIAGNOSIS — E782 Mixed hyperlipidemia: Secondary | ICD-10-CM | POA: Diagnosis not present

## 2019-11-24 MED ORDER — HYDRALAZINE HCL 10 MG PO TABS: 10.0000 mg | ORAL_TABLET | Freq: Two times a day (BID) | ORAL | 3 refills | Status: AC

## 2019-11-24 MED ORDER — NITROGLYCERIN 0.4 MG SL SUBL: 0.4000 mg | SUBLINGUAL_TABLET | SUBLINGUAL | 3 refills | Status: AC | PRN

## 2019-11-24 MED ORDER — ASPIRIN 81 MG PO TBEC: 81.0000 mg | DELAYED_RELEASE_TABLET | Freq: Every day | ORAL | 3 refills | Status: DC

## 2019-11-24 MED ORDER — CARVEDILOL 6.25 MG PO TABS: 9.3750 mg | ORAL_TABLET | Freq: Two times a day (BID) | ORAL | 3 refills | Status: DC

## 2019-11-24 MED ORDER — ATORVASTATIN CALCIUM 80 MG PO TABS: 80.0000 mg | ORAL_TABLET | Freq: Every day | ORAL | 3 refills | Status: AC

## 2019-11-24 MED ORDER — TICAGRELOR 90 MG PO TABS: 90.0000 mg | ORAL_TABLET | Freq: Two times a day (BID) | ORAL | 3 refills | Status: DC

## 2019-11-24 NOTE — Patient Instructions (Signed)
Medication Instructions:   Your physician has recommended you make the following change in your medication:   1) Start Hydralazine 10 mg, 1 tablet by mouth twice a day  *If you need a refill on your cardiac medications before your next appointment, please call your pharmacy*   Lab Work:  None ordered today  If you have labs (blood work) drawn today and your tests are completely normal, you will receive your results only by: Marland Kitchen MyChart Message (if you have MyChart) OR . A paper copy in the mail If you have any lab test that is abnormal or we need to change your treatment, we will call you to review the results.  Testing/Procedures:  None ordered today  Follow-Up:  On 01/05/20 at 11:45AM with Richardson Dopp, PA-C

## 2019-11-25 DIAGNOSIS — Z992 Dependence on renal dialysis: Secondary | ICD-10-CM | POA: Diagnosis not present

## 2019-11-25 DIAGNOSIS — D509 Iron deficiency anemia, unspecified: Secondary | ICD-10-CM | POA: Diagnosis not present

## 2019-11-25 DIAGNOSIS — N186 End stage renal disease: Secondary | ICD-10-CM | POA: Diagnosis not present

## 2019-11-25 DIAGNOSIS — Z7689 Persons encountering health services in other specified circumstances: Secondary | ICD-10-CM | POA: Diagnosis not present

## 2019-11-25 DIAGNOSIS — D631 Anemia in chronic kidney disease: Secondary | ICD-10-CM | POA: Diagnosis not present

## 2019-11-26 ENCOUNTER — Ambulatory Visit: Payer: Self-pay | Admitting: Physician Assistant

## 2019-11-27 DIAGNOSIS — N186 End stage renal disease: Secondary | ICD-10-CM | POA: Diagnosis not present

## 2019-11-27 DIAGNOSIS — Z992 Dependence on renal dialysis: Secondary | ICD-10-CM | POA: Diagnosis not present

## 2019-11-27 DIAGNOSIS — D509 Iron deficiency anemia, unspecified: Secondary | ICD-10-CM | POA: Diagnosis not present

## 2019-11-27 DIAGNOSIS — Z7689 Persons encountering health services in other specified circumstances: Secondary | ICD-10-CM | POA: Diagnosis not present

## 2019-11-27 DIAGNOSIS — D631 Anemia in chronic kidney disease: Secondary | ICD-10-CM | POA: Diagnosis not present

## 2019-11-30 DIAGNOSIS — D509 Iron deficiency anemia, unspecified: Secondary | ICD-10-CM | POA: Diagnosis not present

## 2019-11-30 DIAGNOSIS — D631 Anemia in chronic kidney disease: Secondary | ICD-10-CM | POA: Diagnosis not present

## 2019-11-30 DIAGNOSIS — N186 End stage renal disease: Secondary | ICD-10-CM | POA: Diagnosis not present

## 2019-11-30 DIAGNOSIS — Z992 Dependence on renal dialysis: Secondary | ICD-10-CM | POA: Diagnosis not present

## 2019-11-30 DIAGNOSIS — Z7689 Persons encountering health services in other specified circumstances: Secondary | ICD-10-CM | POA: Diagnosis not present

## 2019-12-02 DIAGNOSIS — D509 Iron deficiency anemia, unspecified: Secondary | ICD-10-CM | POA: Diagnosis not present

## 2019-12-02 DIAGNOSIS — Z992 Dependence on renal dialysis: Secondary | ICD-10-CM | POA: Diagnosis not present

## 2019-12-02 DIAGNOSIS — D631 Anemia in chronic kidney disease: Secondary | ICD-10-CM | POA: Diagnosis not present

## 2019-12-02 DIAGNOSIS — N186 End stage renal disease: Secondary | ICD-10-CM | POA: Diagnosis not present

## 2019-12-02 DIAGNOSIS — Z7689 Persons encountering health services in other specified circumstances: Secondary | ICD-10-CM | POA: Diagnosis not present

## 2019-12-04 DIAGNOSIS — Z992 Dependence on renal dialysis: Secondary | ICD-10-CM | POA: Diagnosis not present

## 2019-12-04 DIAGNOSIS — D631 Anemia in chronic kidney disease: Secondary | ICD-10-CM | POA: Diagnosis not present

## 2019-12-04 DIAGNOSIS — Z7689 Persons encountering health services in other specified circumstances: Secondary | ICD-10-CM | POA: Diagnosis not present

## 2019-12-04 DIAGNOSIS — D509 Iron deficiency anemia, unspecified: Secondary | ICD-10-CM | POA: Diagnosis not present

## 2019-12-04 DIAGNOSIS — N186 End stage renal disease: Secondary | ICD-10-CM | POA: Diagnosis not present

## 2019-12-07 DIAGNOSIS — N186 End stage renal disease: Secondary | ICD-10-CM | POA: Diagnosis not present

## 2019-12-07 DIAGNOSIS — Z992 Dependence on renal dialysis: Secondary | ICD-10-CM | POA: Diagnosis not present

## 2019-12-07 DIAGNOSIS — D509 Iron deficiency anemia, unspecified: Secondary | ICD-10-CM | POA: Diagnosis not present

## 2019-12-07 DIAGNOSIS — D631 Anemia in chronic kidney disease: Secondary | ICD-10-CM | POA: Diagnosis not present

## 2019-12-07 DIAGNOSIS — Z7689 Persons encountering health services in other specified circumstances: Secondary | ICD-10-CM | POA: Diagnosis not present

## 2019-12-08 ENCOUNTER — Ambulatory Visit: Payer: Medicaid Other | Admitting: Internal Medicine

## 2019-12-09 DIAGNOSIS — N186 End stage renal disease: Secondary | ICD-10-CM | POA: Diagnosis not present

## 2019-12-09 DIAGNOSIS — D631 Anemia in chronic kidney disease: Secondary | ICD-10-CM | POA: Diagnosis not present

## 2019-12-09 DIAGNOSIS — D509 Iron deficiency anemia, unspecified: Secondary | ICD-10-CM | POA: Diagnosis not present

## 2019-12-09 DIAGNOSIS — Z992 Dependence on renal dialysis: Secondary | ICD-10-CM | POA: Diagnosis not present

## 2019-12-09 DIAGNOSIS — Z7689 Persons encountering health services in other specified circumstances: Secondary | ICD-10-CM | POA: Diagnosis not present

## 2019-12-11 DIAGNOSIS — Z992 Dependence on renal dialysis: Secondary | ICD-10-CM | POA: Diagnosis not present

## 2019-12-11 DIAGNOSIS — D509 Iron deficiency anemia, unspecified: Secondary | ICD-10-CM | POA: Diagnosis not present

## 2019-12-11 DIAGNOSIS — D631 Anemia in chronic kidney disease: Secondary | ICD-10-CM | POA: Diagnosis not present

## 2019-12-11 DIAGNOSIS — N186 End stage renal disease: Secondary | ICD-10-CM | POA: Diagnosis not present

## 2019-12-11 DIAGNOSIS — Z7689 Persons encountering health services in other specified circumstances: Secondary | ICD-10-CM | POA: Diagnosis not present

## 2019-12-14 DIAGNOSIS — D509 Iron deficiency anemia, unspecified: Secondary | ICD-10-CM | POA: Diagnosis not present

## 2019-12-14 DIAGNOSIS — D631 Anemia in chronic kidney disease: Secondary | ICD-10-CM | POA: Diagnosis not present

## 2019-12-14 DIAGNOSIS — N186 End stage renal disease: Secondary | ICD-10-CM | POA: Diagnosis not present

## 2019-12-14 DIAGNOSIS — Z992 Dependence on renal dialysis: Secondary | ICD-10-CM | POA: Diagnosis not present

## 2019-12-14 DIAGNOSIS — Z7689 Persons encountering health services in other specified circumstances: Secondary | ICD-10-CM | POA: Diagnosis not present

## 2019-12-14 LAB — FUNGUS CULTURE WITH STAIN

## 2019-12-14 LAB — FUNGAL ORGANISM REFLEX

## 2019-12-14 LAB — FUNGUS CULTURE RESULT

## 2019-12-15 ENCOUNTER — Other Ambulatory Visit: Payer: Self-pay

## 2019-12-15 ENCOUNTER — Other Ambulatory Visit: Payer: Medicaid Other

## 2019-12-15 DIAGNOSIS — I2111 ST elevation (STEMI) myocardial infarction involving right coronary artery: Secondary | ICD-10-CM

## 2019-12-15 DIAGNOSIS — Z992 Dependence on renal dialysis: Secondary | ICD-10-CM

## 2019-12-15 DIAGNOSIS — I5022 Chronic systolic (congestive) heart failure: Secondary | ICD-10-CM | POA: Diagnosis not present

## 2019-12-15 DIAGNOSIS — N186 End stage renal disease: Secondary | ICD-10-CM | POA: Diagnosis not present

## 2019-12-15 DIAGNOSIS — E782 Mixed hyperlipidemia: Secondary | ICD-10-CM | POA: Diagnosis not present

## 2019-12-15 LAB — HEPATIC FUNCTION PANEL
ALT: 26 IU/L (ref 0–32)
AST: 25 IU/L (ref 0–40)
Albumin: 3.9 g/dL (ref 3.8–4.8)
Alkaline Phosphatase: 150 IU/L — ABNORMAL HIGH (ref 48–121)
Bilirubin Total: 0.6 mg/dL (ref 0.0–1.2)
Bilirubin, Direct: 0.31 mg/dL (ref 0.00–0.40)
Total Protein: 6.7 g/dL (ref 6.0–8.5)

## 2019-12-15 LAB — LIPID PANEL
Chol/HDL Ratio: 2.7 ratio (ref 0.0–4.4)
Cholesterol, Total: 94 mg/dL — ABNORMAL LOW (ref 100–199)
HDL: 35 mg/dL — ABNORMAL LOW (ref >39)
LDL Chol Calc (NIH): 41 mg/dL (ref 0–99)
Triglycerides: 94 mg/dL (ref 0–149)
VLDL Cholesterol Cal: 18 mg/dL (ref 5–40)

## 2019-12-16 DIAGNOSIS — Z7689 Persons encountering health services in other specified circumstances: Secondary | ICD-10-CM | POA: Diagnosis not present

## 2019-12-16 DIAGNOSIS — D509 Iron deficiency anemia, unspecified: Secondary | ICD-10-CM | POA: Diagnosis not present

## 2019-12-16 DIAGNOSIS — N186 End stage renal disease: Secondary | ICD-10-CM | POA: Diagnosis not present

## 2019-12-16 DIAGNOSIS — D631 Anemia in chronic kidney disease: Secondary | ICD-10-CM | POA: Diagnosis not present

## 2019-12-16 DIAGNOSIS — Z992 Dependence on renal dialysis: Secondary | ICD-10-CM | POA: Diagnosis not present

## 2019-12-18 DIAGNOSIS — Z7689 Persons encountering health services in other specified circumstances: Secondary | ICD-10-CM | POA: Diagnosis not present

## 2019-12-18 DIAGNOSIS — N186 End stage renal disease: Secondary | ICD-10-CM | POA: Diagnosis not present

## 2019-12-18 DIAGNOSIS — D631 Anemia in chronic kidney disease: Secondary | ICD-10-CM | POA: Diagnosis not present

## 2019-12-18 DIAGNOSIS — Z992 Dependence on renal dialysis: Secondary | ICD-10-CM | POA: Diagnosis not present

## 2019-12-18 DIAGNOSIS — D509 Iron deficiency anemia, unspecified: Secondary | ICD-10-CM | POA: Diagnosis not present

## 2019-12-21 DIAGNOSIS — Z992 Dependence on renal dialysis: Secondary | ICD-10-CM | POA: Diagnosis not present

## 2019-12-21 DIAGNOSIS — D631 Anemia in chronic kidney disease: Secondary | ICD-10-CM | POA: Diagnosis not present

## 2019-12-21 DIAGNOSIS — N186 End stage renal disease: Secondary | ICD-10-CM | POA: Diagnosis not present

## 2019-12-21 DIAGNOSIS — Z7689 Persons encountering health services in other specified circumstances: Secondary | ICD-10-CM | POA: Diagnosis not present

## 2019-12-21 DIAGNOSIS — D509 Iron deficiency anemia, unspecified: Secondary | ICD-10-CM | POA: Diagnosis not present

## 2019-12-23 DIAGNOSIS — Z7689 Persons encountering health services in other specified circumstances: Secondary | ICD-10-CM | POA: Diagnosis not present

## 2019-12-25 DIAGNOSIS — N186 End stage renal disease: Secondary | ICD-10-CM | POA: Diagnosis not present

## 2019-12-25 DIAGNOSIS — Z7689 Persons encountering health services in other specified circumstances: Secondary | ICD-10-CM | POA: Diagnosis not present

## 2019-12-25 DIAGNOSIS — D509 Iron deficiency anemia, unspecified: Secondary | ICD-10-CM | POA: Diagnosis not present

## 2019-12-25 DIAGNOSIS — D631 Anemia in chronic kidney disease: Secondary | ICD-10-CM | POA: Diagnosis not present

## 2019-12-25 DIAGNOSIS — Z992 Dependence on renal dialysis: Secondary | ICD-10-CM | POA: Diagnosis not present

## 2019-12-28 DIAGNOSIS — N186 End stage renal disease: Secondary | ICD-10-CM | POA: Diagnosis not present

## 2019-12-28 DIAGNOSIS — Z992 Dependence on renal dialysis: Secondary | ICD-10-CM | POA: Diagnosis not present

## 2019-12-28 DIAGNOSIS — D509 Iron deficiency anemia, unspecified: Secondary | ICD-10-CM | POA: Diagnosis not present

## 2019-12-28 DIAGNOSIS — Z7689 Persons encountering health services in other specified circumstances: Secondary | ICD-10-CM | POA: Diagnosis not present

## 2019-12-28 DIAGNOSIS — D631 Anemia in chronic kidney disease: Secondary | ICD-10-CM | POA: Diagnosis not present

## 2019-12-28 LAB — ACID FAST CULTURE WITH REFLEXED SENSITIVITIES (MYCOBACTERIA): Acid Fast Culture: NEGATIVE

## 2019-12-29 ENCOUNTER — Encounter: Payer: Self-pay | Admitting: Family Medicine

## 2019-12-29 ENCOUNTER — Ambulatory Visit (INDEPENDENT_AMBULATORY_CARE_PROVIDER_SITE_OTHER): Payer: Medicaid Other | Admitting: Family Medicine

## 2019-12-29 VITALS — BP 101/64 | HR 84 | Temp 97.5°F | Ht 63.0 in | Wt 142.4 lb

## 2019-12-29 DIAGNOSIS — Z1231 Encounter for screening mammogram for malignant neoplasm of breast: Secondary | ICD-10-CM | POA: Diagnosis not present

## 2019-12-29 DIAGNOSIS — M546 Pain in thoracic spine: Secondary | ICD-10-CM | POA: Diagnosis not present

## 2019-12-29 DIAGNOSIS — K219 Gastro-esophageal reflux disease without esophagitis: Secondary | ICD-10-CM | POA: Diagnosis not present

## 2019-12-29 DIAGNOSIS — Z124 Encounter for screening for malignant neoplasm of cervix: Secondary | ICD-10-CM

## 2019-12-29 DIAGNOSIS — Z7689 Persons encountering health services in other specified circumstances: Secondary | ICD-10-CM | POA: Diagnosis not present

## 2019-12-29 DIAGNOSIS — F329 Major depressive disorder, single episode, unspecified: Secondary | ICD-10-CM | POA: Diagnosis not present

## 2019-12-29 DIAGNOSIS — E1121 Type 2 diabetes mellitus with diabetic nephropathy: Secondary | ICD-10-CM | POA: Diagnosis not present

## 2019-12-29 DIAGNOSIS — E1159 Type 2 diabetes mellitus with other circulatory complications: Secondary | ICD-10-CM

## 2019-12-29 DIAGNOSIS — F32A Depression, unspecified: Secondary | ICD-10-CM

## 2019-12-29 DIAGNOSIS — Z992 Dependence on renal dialysis: Secondary | ICD-10-CM | POA: Diagnosis not present

## 2019-12-29 DIAGNOSIS — I10 Essential (primary) hypertension: Secondary | ICD-10-CM

## 2019-12-29 DIAGNOSIS — E1122 Type 2 diabetes mellitus with diabetic chronic kidney disease: Secondary | ICD-10-CM

## 2019-12-29 DIAGNOSIS — E039 Hypothyroidism, unspecified: Secondary | ICD-10-CM | POA: Diagnosis not present

## 2019-12-29 DIAGNOSIS — N186 End stage renal disease: Secondary | ICD-10-CM

## 2019-12-29 DIAGNOSIS — Z794 Long term (current) use of insulin: Secondary | ICD-10-CM

## 2019-12-29 HISTORY — DX: Pain in thoracic spine: M54.6

## 2019-12-29 HISTORY — DX: Encounter for screening mammogram for malignant neoplasm of breast: Z12.31

## 2019-12-29 HISTORY — DX: Encounter for screening for malignant neoplasm of cervix: Z12.4

## 2019-12-29 MED ORDER — SERTRALINE HCL 25 MG PO TABS: 25.0000 mg | ORAL_TABLET | Freq: Every day | ORAL | 0 refills | Status: DC

## 2019-12-29 MED ORDER — GABAPENTIN 300 MG PO CAPS: 300.0000 mg | ORAL_CAPSULE | Freq: Every day | ORAL | 0 refills | Status: DC

## 2019-12-29 MED ORDER — LEVOTHYROXINE SODIUM 25 MCG PO TABS: 25.0000 ug | ORAL_TABLET | Freq: Every day | ORAL | 1 refills | Status: DC

## 2019-12-29 MED ORDER — INSULIN ASPART PROT & ASPART (70-30 MIX) 100 UNIT/ML ~~LOC~~ SUSP: 10.0000 [IU] | Freq: Two times a day (BID) | SUBCUTANEOUS | 3 refills | Status: DC

## 2019-12-29 NOTE — Assessment & Plan Note (Signed)
Melanie Hall is encouraged to check blood sugar daily as directed. Continue current medications. Is on statin as well. Educated on importance of maintain a well balanced diabetic friendly diet.  She is reminded the importance of maintaining  good blood sugars,  taking medications as directed, daily foot care, annual eye exams. Additionally educated about keeping good control over blood pressure and cholesterol as well.

## 2019-12-29 NOTE — Assessment & Plan Note (Signed)
Denies having any SI or HI.  Needs refill on Zoloft.

## 2019-12-29 NOTE — Assessment & Plan Note (Signed)
Melanie Hall is encouraged to maintain a well balanced diet that is low in salt. Controlled, continue current medication regimen.  Followed closely by cardiology Additionally, she is also reminded that exercise is beneficial for heart health and control of  Blood pressure. 30-60 minutes daily is recommended-walking was suggested.

## 2019-12-29 NOTE — Assessment & Plan Note (Signed)
Refill provided on Synthroid however she needs an updated TSH level.  This will be checked as well.

## 2019-12-29 NOTE — Assessment & Plan Note (Signed)
Is followed by nephrology, dialysis Monday Wednesdays and Fridays.  Does have a question as to whether her site is having any sort of infection advised for her to follow-up with her nephrologist.

## 2019-12-29 NOTE — Assessment & Plan Note (Signed)
Physical therapy ordered

## 2019-12-29 NOTE — Assessment & Plan Note (Signed)
Needs refill on her Neurontin.

## 2019-12-29 NOTE — Assessment & Plan Note (Signed)
Referral to gastroenterologist to make sure that GERD/heartburn is treated appropriately.

## 2019-12-29 NOTE — Progress Notes (Signed)
Subjective:  Patient ID: Melanie Hall, female    DOB: [birth year]  Age: 49 y.o. MRN: [id number]  CC:  Chief Complaint  Patient presents with  . New Patient (Initial Visit)    new pt was a pt at the free clinic is a dialysis pt wants to know why she is staying so sick      HPI  HPI  Melanie Hall presents today to establish care.  Previously seen at the free clinic here in town.  Has a very extensive history which includes but is not limited to end-stage renal disease on dialysis Monday Wednesdays and Fridays, type 2 diabetes on insulin, hyperlipidemia, hypertension, recent STEMI with stent placement among others. She was recently seen by cardiology in May for posthospitalization follow-up.  She had  DES  X 2 placed in her RCA secondary to the inferior STEMI.  EF is 40-45.  She reports still having shortness of breath and was noted to have hypoxia on exam.  She was set up for oxygen and reports that she has this at home.  Chest x-ray confirmed a large right pleural effusion and she was set up for thoracentesis with IR which she reports 1.3 L was removed.  She did have some bleeding and had to go back and get a stitch but otherwise is doing well.  Cytology was notable for reactive mesothelial cells.  And she was referred to pulmonology but she has not been followed up with them as yet.  She did have a fall and fractured her nose and has seen the ENT surgical reduction has been recommended but this has been held off secondary to antiplatelet prescription due to recent PCI and ACS.  Procedure was canceled and will be rescheduled once safely able.  She reports she is doing well on dialysis.  However she reports that the port area is inflamed and they are trying to see if it is what they are cleaning it with versus it is take.  She is able to obtain her dry weight urinalysis.  She reports that she has a fluid restriction of 32 ounces a day.  She had some atypical chest pain from time to time but no  recurrent angina.  They suggested that is not cardiac related when she saw the heart provider and told her to take Tums.  She reports she is unsure why that continues to happen and would like to see if it something else going on or if there is better treatment.  Is open to referral for GI. Additionally she reports that sometimes she will have diarrhea for 3 to 4 hours at a time and she will take Imodium to get it to stop and then she reports that she has some constipation and this goes back and forth since January February timeframe.  She reports that she has back pain in between her shoulder blades.  She denies having any injury or trauma to the site.  She reports is a 7 out of 10.  She is open to going to physical therapy for this.  Of note she also had to have a procedure last year secondary to femur fracture.  She presents today with a walker to help with stabilization.  Of note her most recent labs it is noted that her TSH level was elevated she reports she is taking her thyroid medication as directed but needs a refill.  We will get a repeat of this today all other labs have been near  her baseline and are checked frequently by her cardiologist and nephrologist.  She has not been seen by an eye doctor needs a referral for this.  She has not had a mammogram and is open to having this ordered.  She reports needing refill on several medications which is provided today.   Today patient denies signs and symptoms of COVID 19 infection including fever, chills, cough, shortness of breath, and headache. Past Medical, Surgical, Social History, Allergies, and Medications have been Reviewed.   Past Medical History:  Diagnosis Date  . Acute cystitis without hematuria   . Acute on chronic renal failure ([place]) 02/28/2019  . Acute systolic heart failure ([place]) 10/21/2019  . Anemia   . Cataracts, bilateral    surgery to remove  . CATARACTS, BILATERAL 07/02/2007   Qualifier: Diagnosis of  By: Isla Pence     . Closed fracture of left femur ([place]) 10/28/2018  . Closed fracture of right ankle 11/06/2017  . Diabetes mellitus    type 2  . Emphysematous cystitis 08/26/2018  . ESRD on hemodialysis ([place])   . GERD (gastroesophageal reflux disease)   . Hyperlipidemia   . Hypertension   . IRREGULAR MENSES 09/14/2009   Qualifier: Diagnosis of  By: Hassell Done FNP, Tori Milks    . Normocytic anemia 08/26/2018  . PARONYCHIA, RIGHT GREAT TOE 07/30/2008   Qualifier: Diagnosis of  By: Hassell Done FNP, Tori Milks    . Pressure ulcer 09/10/2019  . Right arm weakness 08/08/2019  . Sprain of left ankle   . STEMI (ST elevation myocardial infarction) ([place]) 10/18/2019  . STEMI involving right coronary artery ([place]) 10/18/2019  . Stroke ([place])   . SVD (spontaneous vaginal delivery)    x 4  . Vaginosis 08/26/2018  . Weakness of both lower extremities     Current Meds  Medication Sig  . aspirin 81 MG EC tablet Take 1 tablet (81 mg total) by mouth daily.  Marland Kitchen atorvastatin (LIPITOR) 80 MG tablet Take 1 tablet (80 mg total) by mouth daily at 6 PM.  . carvedilol (COREG) 6.25 MG tablet Take 1.5 tablets (9.375 mg total) by mouth 2 (two) times daily.  Marland Kitchen epoetin alfa (EPOGEN) 3000 UNIT/ML injection Inject 3,000 Units into the skin every 14 (fourteen) days.   . hydrALAZINE (APRESOLINE) 10 MG tablet Take 1 tablet (10 mg total) by mouth in the morning and at bedtime.  . insulin aspart protamine- aspart (NOVOLOG MIX 70/30) (70-30) 100 UNIT/ML injection Inject 0.1 mLs (10 Units total) into the skin 2 (two) times daily with a meal.  . levothyroxine (SYNTHROID) 25 MCG tablet Take 1 tablet (25 mcg total) by mouth daily before breakfast.  . multivitamin (RENA-VIT) TABS tablet Take 1 tablet by mouth at bedtime.  . nitroGLYCERIN (NITROSTAT) 0.4 MG SL tablet Place 1 tablet (0.4 mg total) under the tongue every 5 (five) minutes x 3 doses as needed for chest pain.  . tamsulosin (FLOMAX) 0.4 MG CAPS capsule Take 1 capsule (0.4 mg total) by mouth daily after  supper.  . ticagrelor (BRILINTA) 90 MG TABS tablet Take 1 tablet (90 mg total) by mouth 2 (two) times daily.  Marland Kitchen zolpidem (AMBIEN) 5 MG tablet Take 1 tablet (5 mg total) by mouth at bedtime as needed for sleep.  . [DISCONTINUED] insulin aspart protamine- aspart (NOVOLOG MIX 70/30) (70-30) 100 UNIT/ML injection Inject 10 Units into the skin 2 (two) times daily with a meal.   . [DISCONTINUED] levothyroxine (SYNTHROID) 25 MCG tablet Take 1 tablet (25 mcg total) by  mouth daily before breakfast.    ROS:  Review of Systems  Constitutional: Negative.   HENT: Negative.   Eyes: Negative.   Respiratory: Negative.   Cardiovascular: Negative.   Gastrointestinal: Positive for heartburn.  Genitourinary: Negative.   Musculoskeletal: Positive for back pain.  Skin: Negative.   Neurological: Negative.   Endo/Heme/Allergies: Negative.   Psychiatric/Behavioral: Negative.   All other systems reviewed and are negative.    Objective:   Today's Vitals: BP 101/64 (BP Location: Right Arm, Patient Position: Sitting, Cuff Size: Normal)   Pulse 84   Temp (!) 97.5 F (36.4 C) (Temporal)   Ht 5\' 3"  (1.6 m)   Wt 142 lb 6.4 oz (64.6 kg)   LMP 12/17/2016   SpO2 93%   BMI 25.23 kg/m  Vitals with BMI 12/29/2019 11/24/2019 11/19/2019  Height 5\' 3"  5\' 2"  5' 2.5"  Weight 142 lbs 6 oz 138 lbs 137 lbs 9 oz  BMI 25.23 87.56 43.32  Systolic 951 884 166  Diastolic 64 64 70  Pulse 84 80 82     Physical Exam Vitals and nursing note reviewed.  Constitutional:      Appearance: Normal appearance. She is well-developed, well-groomed and overweight.  HENT:     Head: Normocephalic and atraumatic.     Right Ear: External ear normal.     Left Ear: External ear normal.     Nose: Nose normal.     Mouth/Throat:     Mouth: Mucous membranes are moist.     Pharynx: Oropharynx is clear.  Eyes:     General:        Right eye: No discharge.        Left eye: No discharge.     Conjunctiva/sclera: Conjunctivae normal.    Cardiovascular:     Rate and Rhythm: Normal rate and regular rhythm.     Pulses: Normal pulses.     Heart sounds: Normal heart sounds.  Pulmonary:     Effort: Pulmonary effort is normal.     Breath sounds: Decreased breath sounds present.  Chest:     Comments: Right upper chest-Right subclavian HD port. Redness around incision site and rash around the site itself. Musculoskeletal:        General: Normal range of motion.     Cervical back: Normal range of motion and neck supple.     Comments: Gilford Rile present   Skin:    General: Skin is warm.  Neurological:     General: No focal deficit present.     Mental Status: She is alert and oriented to person, place, and time.  Psychiatric:        Attention and Perception: Attention normal.        Mood and Affect: Mood normal.        Speech: Speech normal.        Behavior: Behavior normal. Behavior is cooperative.        Thought Content: Thought content normal.        Cognition and Memory: Cognition normal.        Judgment: Judgment normal.     Comments: Pleasant in conversation, good eye contact      Assessment   1. Type 2 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, with long-term current use of insulin ([place])   2. Type 2 diabetes with nephropathy ([place])   3. ESRD (end stage renal disease) on dialysis ([place])   4. Hypertension associated with diabetes ([place])   5. Gastroesophageal reflux  disease, unspecified whether esophagitis present   6. Depression, unspecified depression type   7. Hypothyroidism, unspecified type   8. Acute midline thoracic back pain   9. Encounter for screening for malignant neoplasm of cervix   10. Encounter for screening mammogram for malignant neoplasm of breast     Tests ordered Orders Placed This Encounter  Procedures  . MM 3D SCREEN BREAST BILATERAL  . TSH  . Ambulatory referral to Obstetrics / Gynecology  . Ambulatory referral to Ophthalmology  . Ambulatory referral to Physical  Therapy  . Ambulatory referral to Gastroenterology     Plan:   Please see assessment and plan per problem list above.   Meds ordered this encounter  Medications  . gabapentin (NEURONTIN) 300 MG capsule    Sig: Take 1 capsule (300 mg total) by mouth at bedtime.    Dispense:  30 capsule    Refill:  0    Order Specific Question:   Supervising Provider    Answer:   SIMPSON, MARGARET E [[id number]]  . sertraline (ZOLOFT) 25 MG tablet    Sig: Take 1 tablet (25 mg total) by mouth daily.    Dispense:  30 tablet    Refill:  0    Order Specific Question:   Supervising Provider    Answer:   SIMPSON, MARGARET E [[id number]]  . levothyroxine (SYNTHROID) 25 MCG tablet    Sig: Take 1 tablet (25 mcg total) by mouth daily before breakfast.    Dispense:  30 tablet    Refill:  1    Order Specific Question:   Supervising Provider    Answer:   SIMPSON, MARGARET E [[id number]]  . insulin aspart protamine- aspart (NOVOLOG MIX 70/30) (70-30) 100 UNIT/ML injection    Sig: Inject 0.1 mLs (10 Units total) into the skin 2 (two) times daily with a meal.    Dispense:  10 mL    Refill:  3    Order Specific Question:   Supervising Provider    Answer:   Fayrene Helper [[id number]]    Patient to follow-up in 3 months.  Perlie Mayo, NP

## 2019-12-29 NOTE — Patient Instructions (Addendum)
I appreciate the opportunity to provide you with care for your health and wellness. Today we discussed: establish care   Follow up: 3-4 months   Lab today Referrals today: for pap, PT, and mammogram, eye dr for DM eye exam   Nice to meet you today.  Please continue to practice social distancing to keep you, your family, and our community safe.  If you must go out, please wear a mask and practice good handwashing.  It was a pleasure to see you and I look forward to continuing to work together on your health and well-being. Please do not hesitate to call the office if you need care or have questions about your care.  Have a wonderful day and week. With Gratitude, Cherly Beach, DNP, AGNP-BC

## 2019-12-30 ENCOUNTER — Other Ambulatory Visit: Payer: Self-pay

## 2019-12-30 ENCOUNTER — Encounter: Payer: Self-pay | Admitting: Vascular Surgery

## 2019-12-30 ENCOUNTER — Ambulatory Visit (INDEPENDENT_AMBULATORY_CARE_PROVIDER_SITE_OTHER): Payer: Medicaid Other | Admitting: Vascular Surgery

## 2019-12-30 ENCOUNTER — Ambulatory Visit: Payer: Medicaid Other | Admitting: Vascular Surgery

## 2019-12-30 VITALS — BP 138/73 | HR 76 | Temp 97.7°F | Resp 20 | Ht 63.0 in | Wt 142.0 lb

## 2019-12-30 DIAGNOSIS — Z992 Dependence on renal dialysis: Secondary | ICD-10-CM | POA: Diagnosis not present

## 2019-12-30 DIAGNOSIS — N186 End stage renal disease: Secondary | ICD-10-CM | POA: Diagnosis not present

## 2019-12-30 LAB — TSH: TSH: 4.09 mIU/L

## 2019-12-30 NOTE — Progress Notes (Signed)
Patient name: Melanie Hall MRN: [id number] DOB: Aug 03, 1970 Sex: female  REASON FOR VISIT:   Follow-up of AV fistula.  HPI:   Melanie Hall is a pleasant 49 y.o. female who was seen by Gerri Lins, [place] on 11/19/2019.  She had infiltrated a left upper arm fistula.  She had a left brachiocephalic AV fistula placed on 08/17/2019.  When I 1st began using this she had her infiltrate.  Of note she dialyzes on Monday Wednesdays and Fridays and has a functioning dialysis catheter.  She underwent coronary angioplasty with a drug-eluting stent was placed on Brilinta on 10/18/2019.  She denies pain or paresthesias in her left arm.  Current Outpatient Medications  Medication Sig Dispense Refill  . aspirin 81 MG EC tablet Take 1 tablet (81 mg total) by mouth daily. 90 tablet 3  . atorvastatin (LIPITOR) 80 MG tablet Take 1 tablet (80 mg total) by mouth daily at 6 PM. 90 tablet 3  . carvedilol (COREG) 6.25 MG tablet Take 1.5 tablets (9.375 mg total) by mouth 2 (two) times daily. 180 tablet 3  . epoetin alfa (EPOGEN) 3000 UNIT/ML injection Inject 3,000 Units into the skin every 14 (fourteen) days.     Marland Kitchen gabapentin (NEURONTIN) 300 MG capsule Take 1 capsule (300 mg total) by mouth at bedtime. 30 capsule 0  . hydrALAZINE (APRESOLINE) 10 MG tablet Take 1 tablet (10 mg total) by mouth in the morning and at bedtime. 180 tablet 3  . insulin aspart protamine- aspart (NOVOLOG MIX 70/30) (70-30) 100 UNIT/ML injection Inject 0.1 mLs (10 Units total) into the skin 2 (two) times daily with a meal. 10 mL 3  . levothyroxine (SYNTHROID) 25 MCG tablet Take 1 tablet (25 mcg total) by mouth daily before breakfast. 30 tablet 1  . multivitamin (RENA-VIT) TABS tablet Take 1 tablet by mouth at bedtime. 30 tablet 0  . nitroGLYCERIN (NITROSTAT) 0.4 MG SL tablet Place 1 tablet (0.4 mg total) under the tongue every 5 (five) minutes x 3 doses as needed for chest pain. 25 tablet 3  . sertraline (ZOLOFT) 25 MG tablet Take 1 tablet (25  mg total) by mouth daily. 30 tablet 0  . tamsulosin (FLOMAX) 0.4 MG CAPS capsule Take 1 capsule (0.4 mg total) by mouth daily after supper. 30 capsule 0  . ticagrelor (BRILINTA) 90 MG TABS tablet Take 1 tablet (90 mg total) by mouth 2 (two) times daily. 180 tablet 3  . zolpidem (AMBIEN) 5 MG tablet Take 1 tablet (5 mg total) by mouth at bedtime as needed for sleep. 15 tablet 0   No current facility-administered medications for this visit.    REVIEW OF SYSTEMS:  [X]  denotes positive finding, [ ]  denotes negative finding Vascular    Leg swelling    Cardiac    Chest pain or chest pressure:    Shortness of breath upon exertion:    Short of breath when lying flat:    Irregular heart rhythm:    Constitutional    Fever or chills:     PHYSICAL EXAM:   Vitals:   12/30/19 0913  BP: 138/73  Pulse: 76  Resp: 20  Temp: 97.7 F (36.5 C)  SpO2: 96%  Weight: 142 lb (64.4 kg)  Height: 5\' 3"  (1.6 m)    GENERAL: The patient is a well-nourished female, in no acute distress. The vital signs are documented above. CARDIOVASCULAR: There is a regular rate and rhythm. PULMONARY: There is good air exchange bilaterally without wheezing or  rales. Her upper arm fistula has a good thrill proximally but in the midportion of the upper arm it feels like the vein is somewhat sclerotic and more narrow. She has a palpable radial pulse.  DATA:   DIALYSIS ACCESS DUPLEX: I reviewed her venous duplex scan that was done at the last visit.  The diameters were reasonable except for the midportion where it narrowed down to 0.41 cm.  There are also multiple competing branches.  MEDICAL ISSUES:   END-STAGE RENAL DISEASE: This patient has evidence of some narrowing in the central portion of her upper arm fistula and also some competing branches.  I have recommended that we proceed with a fistulogram to address the stenosis in the fistula.  She dialyzes on Monday Wednesdays and Fridays we will schedule this on a  Tuesday or Thursday.  Pending the results of her fistulogram she may subsequently need ligation of competing branches of her fistula.  We have discussed the indications for the procedure and the potential complications and she is agreeable to proceed.  Given her recent coronary stent we will will not stop her Brilinta prior to the fistulogram or branch ligation if this is required.  Deitra Mayo [facility] [phone]

## 2019-12-30 NOTE — H&P (View-Only) (Signed)
Patient name: Melanie Hall MRN: [id number] DOB: February 05, 1971 Sex: female  REASON FOR VISIT:   Follow-up of AV fistula.  HPI:   Melanie Hall is a pleasant 49 y.o. female who was seen by Gerri Lins, [place] on 11/19/2019.  She had infiltrated a left upper arm fistula.  She had a left brachiocephalic AV fistula placed on 08/17/2019.  When I 1st began using this she had her infiltrate.  Of note she dialyzes on Monday Wednesdays and Fridays and has a functioning dialysis catheter.  She underwent coronary angioplasty with a drug-eluting stent was placed on Brilinta on 10/18/2019.  She denies pain or paresthesias in her left arm.  Current Outpatient Medications  Medication Sig Dispense Refill  . aspirin 81 MG EC tablet Take 1 tablet (81 mg total) by mouth daily. 90 tablet 3  . atorvastatin (LIPITOR) 80 MG tablet Take 1 tablet (80 mg total) by mouth daily at 6 PM. 90 tablet 3  . carvedilol (COREG) 6.25 MG tablet Take 1.5 tablets (9.375 mg total) by mouth 2 (two) times daily. 180 tablet 3  . epoetin alfa (EPOGEN) 3000 UNIT/ML injection Inject 3,000 Units into the skin every 14 (fourteen) days.     Marland Kitchen gabapentin (NEURONTIN) 300 MG capsule Take 1 capsule (300 mg total) by mouth at bedtime. 30 capsule 0  . hydrALAZINE (APRESOLINE) 10 MG tablet Take 1 tablet (10 mg total) by mouth in the morning and at bedtime. 180 tablet 3  . insulin aspart protamine- aspart (NOVOLOG MIX 70/30) (70-30) 100 UNIT/ML injection Inject 0.1 mLs (10 Units total) into the skin 2 (two) times daily with a meal. 10 mL 3  . levothyroxine (SYNTHROID) 25 MCG tablet Take 1 tablet (25 mcg total) by mouth daily before breakfast. 30 tablet 1  . multivitamin (RENA-VIT) TABS tablet Take 1 tablet by mouth at bedtime. 30 tablet 0  . nitroGLYCERIN (NITROSTAT) 0.4 MG SL tablet Place 1 tablet (0.4 mg total) under the tongue every 5 (five) minutes x 3 doses as needed for chest pain. 25 tablet 3  . sertraline (ZOLOFT) 25 MG tablet Take 1 tablet (25  mg total) by mouth daily. 30 tablet 0  . tamsulosin (FLOMAX) 0.4 MG CAPS capsule Take 1 capsule (0.4 mg total) by mouth daily after supper. 30 capsule 0  . ticagrelor (BRILINTA) 90 MG TABS tablet Take 1 tablet (90 mg total) by mouth 2 (two) times daily. 180 tablet 3  . zolpidem (AMBIEN) 5 MG tablet Take 1 tablet (5 mg total) by mouth at bedtime as needed for sleep. 15 tablet 0   No current facility-administered medications for this visit.    REVIEW OF SYSTEMS:  [X]  denotes positive finding, [ ]  denotes negative finding Vascular    Leg swelling    Cardiac    Chest pain or chest pressure:    Shortness of breath upon exertion:    Short of breath when lying flat:    Irregular heart rhythm:    Constitutional    Fever or chills:     PHYSICAL EXAM:   Vitals:   12/30/19 0913  BP: 138/73  Pulse: 76  Resp: 20  Temp: 97.7 F (36.5 C)  SpO2: 96%  Weight: 142 lb (64.4 kg)  Height: 5\' 3"  (1.6 m)    GENERAL: The patient is a well-nourished female, in no acute distress. The vital signs are documented above. CARDIOVASCULAR: There is a regular rate and rhythm. PULMONARY: There is good air exchange bilaterally without wheezing or  rales. Her upper arm fistula has a good thrill proximally but in the midportion of the upper arm it feels like the vein is somewhat sclerotic and more narrow. She has a palpable radial pulse.  DATA:   DIALYSIS ACCESS DUPLEX: I reviewed her venous duplex scan that was done at the last visit.  The diameters were reasonable except for the midportion where it narrowed down to 0.41 cm.  There are also multiple competing branches.  MEDICAL ISSUES:   END-STAGE RENAL DISEASE: This patient has evidence of some narrowing in the central portion of her upper arm fistula and also some competing branches.  I have recommended that we proceed with a fistulogram to address the stenosis in the fistula.  She dialyzes on Monday Wednesdays and Fridays we will schedule this on a  Tuesday or Thursday.  Pending the results of her fistulogram she may subsequently need ligation of competing branches of her fistula.  We have discussed the indications for the procedure and the potential complications and she is agreeable to proceed.  Given her recent coronary stent we will will not stop her Brilinta prior to the fistulogram or branch ligation if this is required.  Deitra Mayo [facility] [place] [phone]

## 2020-01-04 DIAGNOSIS — D509 Iron deficiency anemia, unspecified: Secondary | ICD-10-CM | POA: Diagnosis not present

## 2020-01-04 DIAGNOSIS — Z992 Dependence on renal dialysis: Secondary | ICD-10-CM | POA: Diagnosis not present

## 2020-01-04 DIAGNOSIS — D631 Anemia in chronic kidney disease: Secondary | ICD-10-CM | POA: Diagnosis not present

## 2020-01-04 DIAGNOSIS — Z7689 Persons encountering health services in other specified circumstances: Secondary | ICD-10-CM | POA: Diagnosis not present

## 2020-01-04 DIAGNOSIS — N186 End stage renal disease: Secondary | ICD-10-CM | POA: Diagnosis not present

## 2020-01-04 NOTE — Progress Notes (Signed)
Cardiology Office Note:    Date:  01/05/2020   ID:  Melanie Hall, DOB [birth year], MRN [id number]  PCP:  Perlie Mayo, NP  Cardiologist:  Sherren Mocha, MD   Electrophysiologist:  None   Referring MD: No ref. provider found   Chief Complaint:  Follow-up (CAD, CHF)    Patient Profile:    Melanie Hall is a 49 y.o. female with:   Coronary artery disease  ? S/p late presenting Inf STEMI 10/2019 >> PCI: DES x 2 to RCA  Chronic systolic CHF ? Ischemic cardiomyopathy  ? Echocardiogram 10/19/2019: EF 40-45, inf WMA, Gr 1 DD, Lg L pl Eff ? R Pleural Eff >> s/p tap (-1.3 L) 11/2019  ESRD  Diabetes mellitus  Hypertension   Hyperlipidemia  Prior CV studies: Limited Echocardiogram 10/19/2019 EF 40-45,inferior septum, inferior wall and posterior wall hypokinetic,mild conc LVH, Gr 1 DD, Large L pleural Eff, mild MR  Cardiac catheterization 10/18/2019 LAD irregularities LCx irregularities RCA mid 50, distal 95 (thrombotic) Inferior scar, EF 35, moderate MR PCI: 3.5 x 18 mm Resolute Onyx DES x2 to the RCA  Echocardiogram 10/13/2019 EF 60-65, normal RV SF, moderately elevated PASP (RVSP 43.9), trivial MR  Echocardiogram 08/14/2019 Small pericardial effusion without tamponade physiology, EF 55-60, moderate LVH, RVSP 32.9  Echocardiogram 08/09/2019 EF 65-70  History of Present Illness:    Melanie Hall was admitted in 10/2019 with a late presentation inferior STEMI that was tx with DES x 2 to the RCA.  EF was 40-45.  Post DC, she underwent thoracentesis for a large R pleural effusion.   She was last seen in clinic in 11/2019.  Her HF meds were adjusted.  She returns for follow up.  She is here alone today.  Her breathing has remained stable.  She did miss 2 sessions of dialysis.  She went to dialysis yesterday.  She has not had chest discomfort.  She sleeps in a chair chronically.  She has not had syncope.  Past Medical History:  Diagnosis Date  . Acute cystitis without hematuria    . Acute on chronic renal failure ([place]) 02/28/2019  . Acute systolic heart failure ([place]) 10/21/2019  . Anemia   . Cataracts, bilateral    surgery to remove  . CATARACTS, BILATERAL 07/02/2007   Qualifier: Diagnosis of  By: Isla Pence    . Closed fracture of left femur ([place]) 10/28/2018  . Closed fracture of right ankle 11/06/2017  . Diabetes mellitus    type 2  . Emphysematous cystitis 08/26/2018  . ESRD on hemodialysis ([place])   . GERD (gastroesophageal reflux disease)   . Hyperlipidemia   . Hypertension   . IRREGULAR MENSES 09/14/2009   Qualifier: Diagnosis of  By: Hassell Done FNP, Tori Milks    . Normocytic anemia 08/26/2018  . PARONYCHIA, RIGHT GREAT TOE 07/30/2008   Qualifier: Diagnosis of  By: Hassell Done FNP, Tori Milks    . Pressure ulcer 09/10/2019  . Right arm weakness 08/08/2019  . Sprain of left ankle   . STEMI (ST elevation myocardial infarction) ([place]) 10/18/2019  . STEMI involving right coronary artery ([place]) 10/18/2019  . Stroke ([place])   . SVD (spontaneous vaginal delivery)    x 4  . Vaginosis 08/26/2018  . Weakness of both lower extremities     Current Medications: Current Meds  Medication Sig  . aspirin 81 MG EC tablet Take 1 tablet (81 mg total) by mouth daily.  Marland Kitchen atorvastatin (LIPITOR) 80 MG tablet Take 1 tablet (80 mg total)  by mouth daily at 6 PM.  . carvedilol (COREG) 6.25 MG tablet Take 1.5 tablets (9.375 mg total) by mouth 2 (two) times daily.  Marland Kitchen epoetin alfa (EPOGEN) 3000 UNIT/ML injection Inject 3,000 Units into the skin every 14 (fourteen) days.   Marland Kitchen gabapentin (NEURONTIN) 300 MG capsule Take 1 capsule (300 mg total) by mouth at bedtime.  . hydrALAZINE (APRESOLINE) 10 MG tablet Take 1 tablet (10 mg total) by mouth in the morning and at bedtime.  . insulin aspart protamine- aspart (NOVOLOG MIX 70/30) (70-30) 100 UNIT/ML injection Inject 0.1 mLs (10 Units total) into the skin 2 (two) times daily with a meal.  . levothyroxine (SYNTHROID) 25 MCG tablet Take 1 tablet (25 mcg total)  by mouth daily before breakfast.  . multivitamin (RENA-VIT) TABS tablet Take 1 tablet by mouth at bedtime.  . nitroGLYCERIN (NITROSTAT) 0.4 MG SL tablet Place 1 tablet (0.4 mg total) under the tongue every 5 (five) minutes x 3 doses as needed for chest pain.  Marland Kitchen sertraline (ZOLOFT) 25 MG tablet Take 1 tablet (25 mg total) by mouth daily.  . tamsulosin (FLOMAX) 0.4 MG CAPS capsule Take 1 capsule (0.4 mg total) by mouth daily after supper.  . ticagrelor (BRILINTA) 90 MG TABS tablet Take 1 tablet (90 mg total) by mouth 2 (two) times daily.  Marland Kitchen zolpidem (AMBIEN) 5 MG tablet Take 1 tablet (5 mg total) by mouth at bedtime as needed for sleep.     Allergies:   Penicillins   Social History   Tobacco Use  . Smoking status: Former Smoker    Packs/day: 0.25    Years: 2.00    Pack years: 0.50    Types: Cigarettes    Quit date: 1997    Years since quitting: 24.5  . Smokeless tobacco: Never Used  Vaping Use  . Vaping Use: Never used  Substance Use Topics  . Alcohol use: No  . Drug use: No     Family Hx: The patient's family history includes Bipolar disorder in her mother; Brain cancer in her maternal aunt; Breast cancer in her maternal aunt; Depression in her father; Diabetes in her maternal grandmother, mother, and sister; Heart disease in her maternal grandmother; Hypertension in her mother.  ROS   EKGs/Labs/Other Test Reviewed:    EKG:  EKG is not ordered today.  The ekg ordered today demonstrates n/a  Recent Labs: 08/08/2019: B Natriuretic Peptide 279.0 08/24/2019: Magnesium 1.8 11/12/2019: BUN 35; Creatinine, Ser 4.27; Hemoglobin 11.0; Platelets 186; Potassium 5.1; Sodium 136 12/15/2019: ALT 26 12/29/2019: TSH 4.09   Recent Lipid Panel Lab Results  Component Value Date/Time   CHOL 94 (L) 12/15/2019 09:41 AM   TRIG 94 12/15/2019 09:41 AM   HDL 35 (L) 12/15/2019 09:41 AM   CHOLHDL 2.7 12/15/2019 09:41 AM   CHOLHDL 3.4 10/19/2019 12:34 AM   LDLCALC 41 12/15/2019 09:41 AM     Physical Exam:    VS:  BP 140/80   Pulse 84   Ht 5' 2.5" (1.588 m)   Wt 150 lb (68 kg)   LMP 12/17/2016   BMI 27.00 kg/m     Wt Readings from Last 3 Encounters:  01/05/20 150 lb (68 kg)  12/30/19 142 lb (64.4 kg)  12/29/19 142 lb 6.4 oz (64.6 kg)     Constitutional:      Appearance: Not in distress.  Neck:     Vascular: JVD normal.  Pulmonary:     Effort: Pulmonary effort is normal.  Breath sounds: No wheezing. No rales.     Comments: Decreased breath sounds at R base Cardiovascular:     Normal rate. Regular rhythm. Normal S1. Normal S2.     Murmurs: There is no murmur.  Edema:    Peripheral edema absent.  Abdominal:     Palpations: Abdomen is soft. There is hepatomegaly.  Skin:    General: Skin is warm and dry.  Neurological:     Mental Status: Alert and oriented to person, place and time.     Cranial Nerves: Cranial nerves are intact.       ASSESSMENT & PLAN:    1. Coronary artery disease involving native coronary artery of native heart without angina pectoris Status post late presentation inferior STEMI in April 2021 treated with a DES x2 to the RCA.  She is not having anginal symptoms.  Continue aspirin, ticagrelor, atorvastatin, carvedilol.  2. Chronic systolic CHF (congestive heart failure) (HCC) EF 40-45 by echocardiogram post myocardial infarction.  Ischemic cardiomyopathy.  NYHA II.  She did miss dialysis twice and has had some recent volume excess.  She resumed dialysis yesterday.  Continue current dose of carvedilol, hydralazine.  I will add isosorbide 15 mg daily to maximize her CHF therapy.  3. ESRD (end stage renal disease) on dialysis ([place]) Volume management per dialysis.  She goes to dialysis every Monday, Wednesday, Friday.  She has an AV fistula revision scheduled for 01/12/2020.  4. Pleural effusion on right Secondary to CHF.  s/p post thoracentesis in May 2021.  She may have a small amount of residual right effusion based upon her exam.   She is currently not symptomatic.  Continue volume management with dialysis as noted.    Dispo:  Return in about 4 months (around 05/06/2020) for Routine Follow Up, w/ Dr. Burt Knack, in person.   Medication Adjustments/Labs and Tests Ordered: Current medicines are reviewed at length with the patient today.  Concerns regarding medicines are outlined above.  Tests Ordered: No orders of the defined types were placed in this encounter.  Medication Changes: Meds ordered this encounter  Medications  . isosorbide mononitrate (IMDUR) 30 MG 24 hr tablet    Sig: Take 0.5 tablets (15 mg total) by mouth daily.    Dispense:  15 tablet    Refill:  11    Order Specific Question:   Supervising Provider    Answer:   Lelon Perla [[id number]]    Signed, Richardson Dopp, PA-C  01/05/2020 5:23 PM    [facility] [place], [place], [place]  [address] Phone: [phone]; Fax: [phone]

## 2020-01-05 ENCOUNTER — Encounter: Payer: Self-pay | Admitting: Physician Assistant

## 2020-01-05 ENCOUNTER — Other Ambulatory Visit: Payer: Self-pay

## 2020-01-05 ENCOUNTER — Ambulatory Visit (INDEPENDENT_AMBULATORY_CARE_PROVIDER_SITE_OTHER): Payer: Medicaid Other | Admitting: Physician Assistant

## 2020-01-05 VITALS — BP 140/80 | HR 84 | Ht 62.5 in | Wt 150.0 lb

## 2020-01-05 DIAGNOSIS — I2510 Atherosclerotic heart disease of native coronary artery without angina pectoris: Secondary | ICD-10-CM

## 2020-01-05 DIAGNOSIS — I5022 Chronic systolic (congestive) heart failure: Secondary | ICD-10-CM

## 2020-01-05 DIAGNOSIS — J90 Pleural effusion, not elsewhere classified: Secondary | ICD-10-CM

## 2020-01-05 DIAGNOSIS — N186 End stage renal disease: Secondary | ICD-10-CM

## 2020-01-05 DIAGNOSIS — Z992 Dependence on renal dialysis: Secondary | ICD-10-CM | POA: Diagnosis not present

## 2020-01-05 MED ORDER — ISOSORBIDE MONONITRATE ER 30 MG PO TB24: 15.0000 mg | ORAL_TABLET | Freq: Every day | ORAL | 11 refills | Status: AC

## 2020-01-05 NOTE — Patient Instructions (Signed)
Medication Instructions:  Start Isosorbide Mononitrate 15 mg once daily   *If you need a refill on your cardiac medications before your next appointment, please call your pharmacy*   Follow-Up: At [facility], you and your health needs are our priority.  As part of our continuing mission to provide you with exceptional heart care, we have created designated Provider Care Teams.  These Care Teams include your primary Cardiologist (physician) and Advanced Practice Providers (APPs -  Physician Assistants and Nurse Practitioners) who all work together to provide you with the care you need, when you need it.  We recommend signing up for the patient portal called "MyChart".  Sign up information is provided on this After Visit Summary.  MyChart is used to connect with patients for Virtual Visits (Telemedicine).  Patients are able to view lab/test results, encounter notes, upcoming appointments, etc.  Non-urgent messages can be sent to your provider as well.   To learn more about what you can do with MyChart, go to [email or web address].    Your next appointment:   4 month(s)  The format for your next appointment:   In Person  Provider:   Sherren Mocha, MD

## 2020-01-06 DIAGNOSIS — D509 Iron deficiency anemia, unspecified: Secondary | ICD-10-CM | POA: Diagnosis not present

## 2020-01-06 DIAGNOSIS — Z7689 Persons encountering health services in other specified circumstances: Secondary | ICD-10-CM | POA: Diagnosis not present

## 2020-01-06 DIAGNOSIS — D631 Anemia in chronic kidney disease: Secondary | ICD-10-CM | POA: Diagnosis not present

## 2020-01-06 DIAGNOSIS — N186 End stage renal disease: Secondary | ICD-10-CM | POA: Diagnosis not present

## 2020-01-06 DIAGNOSIS — Z992 Dependence on renal dialysis: Secondary | ICD-10-CM | POA: Diagnosis not present

## 2020-01-12 ENCOUNTER — Encounter (HOSPITAL_COMMUNITY): Payer: Self-pay | Admitting: Surgery

## 2020-01-12 ENCOUNTER — Other Ambulatory Visit: Payer: Self-pay

## 2020-01-12 ENCOUNTER — Encounter (HOSPITAL_COMMUNITY)
Admission: RE | Disposition: A | Discharge status: Home / Self Care | Payer: Self-pay | Source: Home / Self Care | Attending: Surgery

## 2020-01-12 ENCOUNTER — Ambulatory Visit (HOSPITAL_COMMUNITY)
Admission: RE | Admit: 2020-01-12 | Discharge: 2020-01-12 | Disposition: A | Discharge status: Home / Self Care | Payer: Medicaid Other | Attending: Surgery | Admitting: Surgery

## 2020-01-12 DIAGNOSIS — Z7982 Long term (current) use of aspirin: Secondary | ICD-10-CM | POA: Insufficient documentation

## 2020-01-12 DIAGNOSIS — N186 End stage renal disease: Secondary | ICD-10-CM | POA: Diagnosis not present

## 2020-01-12 DIAGNOSIS — Z79890 Hormone replacement therapy (postmenopausal): Secondary | ICD-10-CM | POA: Insufficient documentation

## 2020-01-12 DIAGNOSIS — T82858A Stenosis of vascular prosthetic devices, implants and grafts, initial encounter: Secondary | ICD-10-CM | POA: Diagnosis not present

## 2020-01-12 DIAGNOSIS — Z992 Dependence on renal dialysis: Secondary | ICD-10-CM | POA: Diagnosis not present

## 2020-01-12 DIAGNOSIS — Y841 Kidney dialysis as the cause of abnormal reaction of the patient, or of later complication, without mention of misadventure at the time of the procedure: Secondary | ICD-10-CM | POA: Insufficient documentation

## 2020-01-12 DIAGNOSIS — T82898A Other specified complication of vascular prosthetic devices, implants and grafts, initial encounter: Secondary | ICD-10-CM | POA: Diagnosis not present

## 2020-01-12 DIAGNOSIS — Z79899 Other long term (current) drug therapy: Secondary | ICD-10-CM | POA: Insufficient documentation

## 2020-01-12 DIAGNOSIS — Z794 Long term (current) use of insulin: Secondary | ICD-10-CM | POA: Diagnosis not present

## 2020-01-12 HISTORY — PX: A/V FISTULAGRAM: CATH118298

## 2020-01-12 HISTORY — PX: PERIPHERAL VASCULAR BALLOON ANGIOPLASTY: CATH118281

## 2020-01-12 LAB — POCT I-STAT, CHEM 8
BUN: 14 mg/dL (ref 6–20)
Calcium, Ion: 0.97 mmol/L — ABNORMAL LOW (ref 1.15–1.40)
Chloride: 100 mmol/L (ref 98–111)
Creatinine, Ser: 3.5 mg/dL — ABNORMAL HIGH (ref 0.44–1.00)
Glucose, Bld: 180 mg/dL — ABNORMAL HIGH (ref 70–99)
HCT: 36 % (ref 36.0–46.0)
Hemoglobin: 12.2 g/dL (ref 12.0–15.0)
Potassium: 3.9 mmol/L (ref 3.5–5.1)
Sodium: 137 mmol/L (ref 135–145)
TCO2: 26 mmol/L (ref 22–32)

## 2020-01-12 SURGERY — A/V FISTULAGRAM
Anesthesia: LOCAL | Laterality: Left

## 2020-01-12 MED ORDER — IODIXANOL 320 MG/ML IV SOLN
INTRAVENOUS | Status: DC | PRN
  Administered 2020-01-12: 30 mL

## 2020-01-12 MED ORDER — SODIUM CHLORIDE 0.9% FLUSH: 3.0000 mL | Freq: Two times a day (BID) | INTRAVENOUS | Status: DC

## 2020-01-12 MED ORDER — SODIUM CHLORIDE 0.9% FLUSH: 3.0000 mL | INTRAVENOUS | Status: DC | PRN

## 2020-01-12 MED ORDER — LIDOCAINE HCL (PF) 1 % IJ SOLN
INTRAMUSCULAR | Status: DC | PRN
  Administered 2020-01-12: 2 mL via INTRADERMAL

## 2020-01-12 MED ORDER — MIDAZOLAM HCL 5 MG/5ML IJ SOLN
INTRAMUSCULAR | Status: AC
  Filled 2020-01-12: qty 5

## 2020-01-12 MED ORDER — FENTANYL CITRATE (PF) 100 MCG/2ML IJ SOLN
INTRAMUSCULAR | Status: AC
  Filled 2020-01-12: qty 2

## 2020-01-12 MED ORDER — ONDANSETRON HCL 4 MG/2ML IJ SOLN
INTRAMUSCULAR | Status: DC | PRN
  Administered 2020-01-12: 4 mg via INTRAVENOUS

## 2020-01-12 MED ORDER — LIDOCAINE HCL (PF) 1 % IJ SOLN
INTRAMUSCULAR | Status: AC
  Filled 2020-01-12: qty 30

## 2020-01-12 MED ORDER — MIDAZOLAM HCL 2 MG/2ML IJ SOLN
INTRAMUSCULAR | Status: DC | PRN
  Administered 2020-01-12: 1 mg via INTRAVENOUS

## 2020-01-12 MED ORDER — SODIUM CHLORIDE 0.9 % IV SOLN: 250.0000 mL | INTRAVENOUS | Status: DC | PRN

## 2020-01-12 MED ORDER — HEPARIN (PORCINE) IN NACL 1000-0.9 UT/500ML-% IV SOLN
INTRAVENOUS | Status: DC | PRN
  Administered 2020-01-12: 500 mL

## 2020-01-12 MED ORDER — FENTANYL CITRATE (PF) 100 MCG/2ML IJ SOLN
INTRAMUSCULAR | Status: DC | PRN
  Administered 2020-01-12: 25 ug via INTRAVENOUS

## 2020-01-12 MED ORDER — HEPARIN (PORCINE) IN NACL 1000-0.9 UT/500ML-% IV SOLN
INTRAVENOUS | Status: AC
  Filled 2020-01-12: qty 500

## 2020-01-12 MED ORDER — ONDANSETRON HCL 4 MG/2ML IJ SOLN
INTRAMUSCULAR | Status: AC
  Filled 2020-01-12: qty 2

## 2020-01-12 SURGICAL SUPPLY — 15 items
BAG SNAP BAND KOVER 36X36 (MISCELLANEOUS) ×2 IMPLANT
COVER DOME SNAP 22 D (MISCELLANEOUS) ×2 IMPLANT
DCB RANGER 7.0X60 135 (BALLOONS) ×1 IMPLANT
GUIDEWIRE ZILIENT 6G 014 (WIRE) ×2 IMPLANT
KIT ENCORE 26 ADVANTAGE (KITS) ×2 IMPLANT
KIT MICROPUNCTURE NIT STIFF (SHEATH) ×2 IMPLANT
PROTECTION STATION PRESSURIZED (MISCELLANEOUS) ×2
RANGER DCB 7.0X60 135 (BALLOONS) ×2
SHEATH PINNACLE R/O II 6F 4CM (SHEATH) ×2 IMPLANT
SHEATH PROBE COVER 6X72 (BAG) ×2 IMPLANT
STATION PROTECTION PRESSURIZED (MISCELLANEOUS) ×1 IMPLANT
STOPCOCK MORSE 400PSI 3WAY (MISCELLANEOUS) ×2 IMPLANT
TRAY PV CATH (CUSTOM PROCEDURE TRAY) ×2 IMPLANT
TUBING CIL FLEX 10 FLL-RA (TUBING) ×2 IMPLANT
WIRE STARTER BENTSON 035X150 (WIRE) ×2 IMPLANT

## 2020-01-12 NOTE — Interval H&P Note (Signed)
History and Physical Interval Note:  01/12/2020 6:35 AM  Melanie Hall  has presented today for surgery, with the diagnosis of end stage renal disease.  The various methods of treatment have been discussed with the patient and family. After consideration of risks, benefits and other options for treatment, the patient has consented to  Procedure(s): A/V FISTULAGRAM (Left) as a surgical intervention.  The patient's history has been reviewed, patient examined, no change in status, stable for surgery.  I have reviewed the patient's chart and labs.  Questions were answered to the patient's satisfaction.     Annamarie Major

## 2020-01-12 NOTE — Op Note (Signed)
° ° °  Patient name: Melanie Hall MRN: [id number] DOB: 03/05/1971 Sex: female  01/12/2020 Pre-operative Diagnosis: Nonmaturing left brachiocephalic fistula Post-operative diagnosis:  Same Surgeon:  Annamarie Major Procedure Performed:  1.  Ultrasound-guided access, left cephalic vein  2.  Fistulogram  3.  Drug-coated balloon angioplasty, left cephalic vein (peripheral vein)  4.  Conscious sedation, 31 minutes    Indications: The patient has had difficulty with access.  Ultrasound suggested a mid vein stenosis.  She is here for further evaluation.  Procedure:  The patient was identified in the holding area and taken to room 8.  The patient was then placed supine on the table and prepped and draped in the usual sterile fashion.  A time out was called.  Conscious sedation was administered with the use of IV fentanyl and Versed under continuous physician and nurse monitoring.  Heart rate, blood pressure, and oxygen saturations were continuously monitored.  Total sedation time was 31 minutes ultrasound was used to evaluate the fistula.  The vein was patent and compressible.  A digital ultrasound image was acquired.  The fistula was then accessed under ultrasound guidance using a micropuncture needle.  An 018 wire was then asvanced without resistance and a micropuncture sheath was placed.  Contrast injections were then performed through the sheath.  Findings: The central venous system is widely patent.  The arterial venous anastomosis is widely patent.  The fistula is patent however in the midportion there is a stenosis approximately 58 mm in length.  This is approximately a 60% stenosis.  There are multiple patent branches, the largest is in the midportion of the fistula.   Intervention: After the above images were acquired the decision made to proceed with intervention.  Over a 035 wire, a 6 French sheath was inserted.  I then advanced a 018 wire and a 7 x 60 Ranger drug-coated balloon and perform balloon  angioplasty of the vein at 12 atm for 3 minutes.  Completion imaging showed resolution of the stenosis.  The sheath was then removed and suture closure was performed.  Impression:  #1  Mid fistula 60% stenosis treated with a 7 x 60 drug-coated balloon with minimal residual stenosis.  #2  The patient has several branches.  If she continues to have difficulty with cannulation, branch ligation could be considered  #3  Widely patent central venous system as well as arterial venous anastomosis.     Theotis Burrow, M.D., [facility] [place] Office: [phone] Pager:  [phone]

## 2020-01-12 NOTE — Discharge Instructions (Signed)
Dialysis Fistulogram  A dialysis fistulogram is an X-ray test to look inside the site where blood is removed and returned to your body during dialysis. Fistulas and grafts provide easy and effective access for dialysis. However, sometimes problems can occur. The fistula or graft can become clogged or narrow. This can make dialysis less effective. You may need to have a fistulogram to check for these problems. If a problem is found, your health care provider can do treatments during the procedure to clear the blocked or narrowed areas. Treatments to open a blocked dialysis fistula or graft may include:  Removing the clot from the fistula or graft with a device (thrombectomy).  A procedure to open or widen the blocked area with a balloon (angioplasty).  Placing a small mesh tube (stent) to keep the fistula or graft open.  Injecting a medicine to dissolve the clot (thrombolysis). Tell a health care provider about:  Any allergies you have, including any reactions you have had to contrast dye.  All medicines you are taking, including vitamins, herbs, eye drops, creams, and over-the-counter medicines.  Any problems you or family members have had with anesthetic medicines.  Any blood disorders you have.  Any surgeries you have had.  Any medical conditions you have.  Whether you are pregnant or may be pregnant.  Any pain, redness, or swelling you have in your limb that has the dialysis fistula or graft.  Any bleeding from your dialysis fistula or graft.  Any of the following in the part of your body where the dialysis fistula or graft leads: ? Numbness. ? Tingling. ? A cold feeling. ? Areas that are bluish or pale white in color. What are the risks? Generally, this is a safe procedure. However, problems may occur, including:  Infection.  Bleeding.  Allergic reactions to medicines or dyes.  Damage to other structures, such as nearby nerves or blood vessels.  A blood clot in the  dialysis fistula or graft.  A blood clot that travels to your lungs (pulmonary embolism). What happens before the procedure? General instructions  Follow instructions from your health care provider about eating and drinking restrictions.  Ask your health care provider about: ? Changing or stopping your regular medicines. This is especially important if you are taking diabetes medicines or blood thinners (anticoagulants). ? Taking medicines such as aspirin and ibuprofen. These medicines can thin your blood. Do not take these medicines unless your health care provider tells you to take them. ? Taking over-the-counter medicines, vitamins, herbs, and supplements.  Plan to have someone take you home from the hospital or clinic.  Plan to have a responsible adult care for you for at least 24 hours after you leave the hospital or clinic. This is important.  You may have a blood or urine sample taken. These will help your health care provider learn how well your kidneys and liver are working and how well your blood clots. What happens during the procedure?  An IV will be inserted into one of your veins.  You will be given one or more of the following: ? A medicine to help you relax (sedative). ? A medicine to numb the area (local anesthetic) on the limb with your fistula or graft.  A needle will be inserted into your vein.  A small, thin tube (catheter) will be inserted into the needle and guided to the area of possible problems.  Dye (contrast material) will be injected into the catheter. As the contrast material passes through   your body, you may feel warm.  X-rays will be taken. The contrast material will show where any narrowing or blockages are.  If narrowing or a blockage is seen, the health care provider may do one of the following to open the blockage: ? Thrombectomy. The health care provider will use a mechanical device at the end of the catheter to break up the  clot. ? Angioplasty. The health care provider will advance a guide wire and balloon-tipped catheter to the blockage site. The balloon will be inflated for a short period of time. A stent may also be placed to keep the blocked area open. ? Thrombolysis. The health care provider will deliver a clot-dissolving medicine through the catheter.  When the procedure is complete, the catheter will be removed.  In some cases, the catheter site will be closed with stitches (sutures).  Pressure will be applied to stop any bleeding, and your skin will be covered with a bandage (dressing). The procedure may vary among health care providers and hospitals. What happens after the procedure?  Your blood pressure, heart rate, breathing rate, and blood oxygen level will be monitored until you leave the hospital or clinic. Your health care provider will also monitor your access site.  You will need to stay in bed for several hours.  Do not drive for 24 hours if you were given a sedative during your procedure. Summary  A dialysis fistulogram is an X-ray test to look inside the site where blood is removed and returned to your body during dialysis.  If a problem is found, your health care provider can also do treatments during the procedure to clear the blocked or narrowed areas. This information is not intended to replace advice given to you by your health care provider. Make sure you discuss any questions you have with your health care provider. Document Revised: 07/26/2017 Document Reviewed: 07/26/2017 Elsevier Patient Education  2020 [employer].

## 2020-01-12 NOTE — Progress Notes (Signed)
Discharge instructions reviewed with pt and message left for pt daughter

## 2020-01-13 ENCOUNTER — Other Ambulatory Visit: Payer: Self-pay | Admitting: *Deleted

## 2020-01-13 ENCOUNTER — Telehealth: Payer: Self-pay | Admitting: Family Medicine

## 2020-01-13 DIAGNOSIS — E039 Hypothyroidism, unspecified: Secondary | ICD-10-CM

## 2020-01-13 MED ORDER — LEVOTHYROXINE SODIUM 25 MCG PO TABS: 25.0000 ug | ORAL_TABLET | Freq: Every day | ORAL | 1 refills | Status: DC

## 2020-01-13 MED ORDER — CARVEDILOL 6.25 MG PO TABS: 9.3750 mg | ORAL_TABLET | Freq: Two times a day (BID) | ORAL | 3 refills | Status: DC

## 2020-01-13 MED ORDER — ASPIRIN 81 MG PO TBEC: 81.0000 mg | DELAYED_RELEASE_TABLET | Freq: Every day | ORAL | 3 refills | Status: AC

## 2020-01-13 NOTE — Telephone Encounter (Signed)
Pt requesting refills for  Carvedilol, aspirin, levothyroxine, and Amlodipine  Pt is requesting a call about Rx when sent tho Pharmacy.

## 2020-01-13 NOTE — Telephone Encounter (Signed)
Pt aware medications have been sent to pharmacy

## 2020-01-14 ENCOUNTER — Other Ambulatory Visit: Payer: Self-pay

## 2020-01-14 ENCOUNTER — Encounter: Payer: Self-pay | Admitting: Adult Health

## 2020-01-14 ENCOUNTER — Other Ambulatory Visit (HOSPITAL_COMMUNITY)
Admission: RE | Admit: 2020-01-14 | Discharge: 2020-01-14 | Disposition: A | Discharge status: Home / Self Care | Payer: Medicaid Other | Source: Ambulatory Visit | Attending: Adult Health | Admitting: Adult Health

## 2020-01-14 ENCOUNTER — Ambulatory Visit (INDEPENDENT_AMBULATORY_CARE_PROVIDER_SITE_OTHER): Payer: Medicaid Other | Admitting: Adult Health

## 2020-01-14 VITALS — BP 110/61 | HR 70 | Ht 62.2 in | Wt 140.8 lb

## 2020-01-14 DIAGNOSIS — Z1211 Encounter for screening for malignant neoplasm of colon: Secondary | ICD-10-CM | POA: Insufficient documentation

## 2020-01-14 DIAGNOSIS — Z01419 Encounter for gynecological examination (general) (routine) without abnormal findings: Secondary | ICD-10-CM | POA: Insufficient documentation

## 2020-01-14 DIAGNOSIS — K529 Noninfective gastroenteritis and colitis, unspecified: Secondary | ICD-10-CM | POA: Insufficient documentation

## 2020-01-14 HISTORY — DX: Encounter for screening for malignant neoplasm of colon: Z12.11

## 2020-01-14 HISTORY — DX: Encounter for gynecological examination (general) (routine) without abnormal findings: Z01.419

## 2020-01-14 LAB — HEMOCCULT GUIAC POC 1CARD (OFFICE): Fecal Occult Blood, POC: NEGATIVE

## 2020-01-14 NOTE — Progress Notes (Signed)
Patient ID: Melanie Hall, female   DOB: 1971/06/03, 49 y.o.   MRN: [id number] History of Present Illness: Melanie Hall is a 49 year old white female, widowed, PM in for well woman gyn exam and pap. She has history of a stroke, heart attack(has 2 stents she says) and now is going to dialysis for kidney failure.  PCP is Cherly Beach NP  Current Medications, Allergies, Past Medical History, Past Surgical History, Family History and Social History were reviewed in [employer] record.     Review of Systems: Patient denies any headaches, hearing loss, fatigue, blurred vision, shortness of breath, chest pain, abdominal pain, problems with urination, or intercourse. No joint pain or mood swings. Pt says she has had diarrhea for over a year, has some nausea and occasional vomiting   Physical Exam:BP 110/61 (BP Location: Right Arm, Patient Position: Sitting, Cuff Size: Normal)   Pulse 70   Ht 5' 2.2" (1.58 m)   Wt 140 lb 12.8 oz (63.9 kg)   LMP 12/17/2016   BMI 25.59 kg/m  General:  Well developed, thin female, no acute distress,she walks with a walker Skin:  Warm and dry Neck:  Midline trachea, normal thyroid, good ROM, no lymphadenopathy Lungs; Clear to auscultation bilaterally Breast:  No dominant palpable mass, retraction, or nipple discharge Cardiovascular: Regular rate and rhythm Abdomen:  Soft, non tender,+enlarged liver  Pelvic:  External genitalia is normal in appearance, no lesions.  The vagina is normal in appearance. Urethra has no lesions or masses. The cervix is bulbous. Pap with high risk HPV 16/18 genotyping performed. Uterus is felt to be normal size, shape, and contour.  No adnexal masses or tenderness noted.Bladder is non tender, no masses felt. Rectal: Poor sphincter tone, no polyps, or hemorrhoids felt.  Hemoccult negative.Has moisture changes on buttocks, is wearing a pull up Extremities/musculoskeletal:  No swelling or varicosities noted, no cyanosis Psych:   No mood changes, alert and cooperative,seems happy AA is 0 Fall risk is high, assisted on and off the table PHQ 9 score is 0 Examination chaperoned by Diona Fanti CMA  Impression and Plan: 1. Encounter for gynecological examination with Papanicolaou smear of cervix Pap sent Papin 3 years if normal Get physical with PCP next year Labs with PCP, Dr Theador Hawthorne and cardiologist Dr Burt Knack Get mammogram, has appt 7/21 she wants to change, number given to her [facility] at [facility]  2. Encounter for screening fecal occult blood testing Referred to Dr Laural Golden for colonoscopy she will be 49 in 2 weeks   3. Chronic diarrhea of unknown origin Referred to Dr Laural Golden

## 2020-01-19 LAB — CYTOLOGY - PAP
Comment: NEGATIVE
Diagnosis: NEGATIVE
High risk HPV: NEGATIVE

## 2020-01-21 ENCOUNTER — Other Ambulatory Visit: Payer: Self-pay | Admitting: Physician Assistant

## 2020-01-21 ENCOUNTER — Telehealth: Payer: Self-pay | Admitting: Family Medicine

## 2020-01-21 ENCOUNTER — Encounter (HOSPITAL_COMMUNITY): Payer: Self-pay | Admitting: Physical Therapy

## 2020-01-21 ENCOUNTER — Ambulatory Visit (HOSPITAL_COMMUNITY): Payer: Medicaid Other | Attending: Family Medicine | Admitting: Physical Therapy

## 2020-01-21 ENCOUNTER — Other Ambulatory Visit: Payer: Self-pay

## 2020-01-21 DIAGNOSIS — G8929 Other chronic pain: Secondary | ICD-10-CM | POA: Diagnosis not present

## 2020-01-21 DIAGNOSIS — M6281 Muscle weakness (generalized): Secondary | ICD-10-CM | POA: Diagnosis not present

## 2020-01-21 DIAGNOSIS — M545 Low back pain, unspecified: Secondary | ICD-10-CM

## 2020-01-21 DIAGNOSIS — R296 Repeated falls: Secondary | ICD-10-CM | POA: Diagnosis not present

## 2020-01-21 DIAGNOSIS — R262 Difficulty in walking, not elsewhere classified: Secondary | ICD-10-CM | POA: Insufficient documentation

## 2020-01-21 NOTE — Telephone Encounter (Signed)
Pt is wanting a new walker and is asking for a call to see if it goes through

## 2020-01-21 NOTE — Telephone Encounter (Signed)
Pt will need an in office appt to get this ordered please call and schedule

## 2020-01-22 NOTE — Therapy (Signed)
[place], [place], [address] Phone: [phone]   Fax:  [phone]  Physical Therapy Evaluation  Patient Details  Name: Melanie Hall MRN: [id number] Date of Birth: 13-Aug-1970 Referring Provider (PT): Cherly Beach    Encounter Date: 01/21/2020   PT End of Session - 01/21/20 1700    Visit Number 1    Number of Visits 16    Date for PT Re-Evaluation 03/17/20    Authorization Type [employer]- put in for 16 visits    Progress Note Due on Visit 10    PT Start Time 1130    PT Stop Time 1215    PT Time Calculation (min) 45 min    Activity Tolerance Patient tolerated treatment well    Behavior During Therapy [facility] for tasks assessed/performed           Past Medical History:  Diagnosis Date  . Acute cystitis without hematuria   . Acute on chronic renal failure ([place]) 02/28/2019  . Acute systolic heart failure ([place]) 10/21/2019  . Anemia   . Cataracts, bilateral    surgery to remove  . CATARACTS, BILATERAL 07/02/2007   Qualifier: Diagnosis of  By: Isla Pence    . Closed fracture of left femur ([place]) 10/28/2018  . Closed fracture of right ankle 11/06/2017  . Diabetes mellitus    type 2  . Emphysematous cystitis 08/26/2018  . ESRD on hemodialysis ([place])   . GERD (gastroesophageal reflux disease)   . Hyperlipidemia   . Hypertension   . IRREGULAR MENSES 09/14/2009   Qualifier: Diagnosis of  By: Hassell Done FNP, Tori Milks    . Normocytic anemia 08/26/2018  . PARONYCHIA, RIGHT GREAT TOE 07/30/2008   Qualifier: Diagnosis of  By: Hassell Done FNP, Tori Milks    . Pressure ulcer 09/10/2019  . Right arm weakness 08/08/2019  . Sprain of left ankle   . STEMI (ST elevation myocardial infarction) ([place]) 10/18/2019  . STEMI involving right coronary artery ([place]) 10/18/2019  . Stroke ([place])   . SVD (spontaneous vaginal delivery)    x 4  . Vaginosis 08/26/2018  . Weakness of both lower extremities     Past Surgical History:  Procedure  Laterality Date  . A/V FISTULAGRAM Left 01/12/2020   Procedure: A/V FISTULAGRAM;  Surgeon: Serafina Mitchell, MD;  Location: [place] [facility];  Service: Cardiovascular;  Laterality: Left;  . AV FISTULA PLACEMENT Left 08/17/2019   Procedure: LEFT BRACHIAL CEPHALIC ARTERIOVENOUS (AV) FISTULA;  Surgeon: Angelia Mould, MD;  Location: [place];  Service: Vascular;  Laterality: Left;  . CORONARY STENT INTERVENTION N/A 10/18/2019   Procedure: CORONARY STENT INTERVENTION;  Surgeon: Sherren Mocha, MD;  Location: [place] [facility];  Service: Cardiovascular;  Laterality: N/A;  . CORONARY/GRAFT ACUTE MI REVASCULARIZATION N/A 10/18/2019   Procedure: Coronary/Graft Acute MI Revascularization;  Surgeon: Sherren Mocha, MD;  Location: [place] [facility];  Service: Cardiovascular;  Laterality: N/A;  . EYE SURGERY Bilateral    cataracts removed  . FEMUR IM NAIL Left 10/28/2018   Procedure: RETROGRADE FEMORAL NAILING;  Surgeon: Meredith Pel, MD;  Location: [place];  Service: Orthopedics;  Laterality: Left;  . IM NAILING FEMORAL SHAFT RETROGRADE Left 10/28/2018  . INTRAVASCULAR ULTRASOUND/IVUS N/A 10/18/2019   Procedure: Intravascular Ultrasound/IVUS;  Surgeon: Sherren Mocha, MD;  Location: [place] CV LAB;  Service: Cardiovascular;  Laterality: N/A;  . IR FLUORO GUIDE CV LINE RIGHT  08/11/2019  . IR THORACENTESIS ASP PLEURAL SPACE W/IMG GUIDE  11/12/2019  . IR US GUIDE VASC ACCESS RIGHT  08/11/2019  . KNEE SURGERY Left   . LEFT HEART CATH AND CORONARY ANGIOGRAPHY N/A 10/18/2019   Procedure: LEFT HEART CATH AND CORONARY ANGIOGRAPHY;  Surgeon: Sherren Mocha, MD;  Location: [place] [facility];  Service: Cardiovascular;  Laterality: N/A;  . PERIPHERAL VASCULAR BALLOON ANGIOPLASTY Left 01/12/2020   Procedure: PERIPHERAL VASCULAR BALLOON ANGIOPLASTY;  Surgeon: Serafina Mitchell, MD;  Location: [place] [facility];  Service: Cardiovascular;  Laterality: Left;  AVF  . RADIOLOGY WITH ANESTHESIA N/A 09/15/2019    Procedure: [facility] AND LUMBER LOWER BACK PAIN;  Surgeon: Radiologist, Medication, MD;  Location: [place];  Service: Radiology;  Laterality: N/A;  . TUBAL LIGATION      There were no vitals filed for this visit.    Subjective Assessment - 01/21/20 1142    Subjective [name] states that she has had mid back pain for years.  The pain is equal on both  sides.  She has been using a rolling walker for about a year and a half, since she broke her LT leg.  She was given a walker in the hospital and she has never been able to get off of it.    Pertinent History HTN,DM, CVA, femoral nailing 10/2018, LE weakness on dialysis (MWF)    Limitations Lifting;Standing;Walking;House hold activities    How long can you sit comfortably? no problem    How long can you walk comfortably? 3 hours using a walker or cart.    Patient Stated Goals to be off her walker    Currently in Pain? Yes    Pain Score 7    goes as high as a 20   Pain Location Back    Pain Orientation Lower;Mid    Pain Descriptors / Indicators Aching    Pain Type Chronic pain    Pain Onset More than a month ago    Pain Frequency Constant    Aggravating Factors  not sure    Pain Relieving Factors nothing    Effect of Pain on Daily Activities limits                Assessment  Medical Diagnosis LE weakness, gait instability and back pain   Referring Provider (PT) Cherly Beach   Onset Date/Surgical Date 10/27/18  Next MD Visit not scheduled   Prior Therapy [facility] for 2 weeks   Precautions  Precautions Fall  Restrictions  Weight Bearing Restrictions No  Balance Screen  Has the patient fallen in the past 6 months Yes  How many times? 2  Has the patient had a decrease in activity level because of a fear of falling?  Yes  Is the patient reluctant to leave their home because of a fear of falling?  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  Prior Function  Level of Independence Independent  Vocation On disability    Cognition  Overall Cognitive Status Within Functional Limits for tasks assessed  Functional Tests  Functional tests Single leg stance;Sit to Stand  Single Leg Stance  Comments LT 0 ; Rt 1 "   Sit to Stand  Comments using UE 5 sit to stand in 30 seconds   ROM / Strength  AROM / PROM / Strength Strength  Strength  Strength Assessment Site Hip;Knee;Ankle  Right/Left Hip Right;Left  Right/Left Knee Right;Left  Right/Left Ankle Right;Left  Right Hip Flexion 3/5  Right Hip Extension 3/5  Right Hip ABduction 3/5  Left Hip Flexion 3+/5  Left Hip Extension 3-/5  Left Hip ABduction 4/5  Right Knee Flexion 4/5  Right Knee Extension 5/5  Left Knee Flexion 3+/5  Left Knee Extension 4/5  Ambulation/Gait  Assistive device Rolling walker  Gait Comments 2 MWT               Ambulation/Gait  Assistive device Rolling walker  Gait Comments 2 MWT   Exercises  Exercises Knee/Hip  Knee/Hip Exercises: Standing  Heel Raises Both;10 reps  Functional Squat 10 reps  Knee/Hip Exercises: Seated  Other Seated Knee/Hip Exercises sitting tall with scapular retraction   Sit to Sand 10 reps                   PT Education - 01/21/20 1659    Education Details HEp    Person(s) Educated Patient    Methods Explanation;Handout;Demonstration;Verbal cues    Comprehension Verbalized understanding            PT Short Term Goals - 01/21/20 1708      PT SHORT TERM GOAL #1   Title Pt to be I in her HEP to improve her functioning level.    Time 4    Period Weeks    Status New    Target Date 02/19/20      PT SHORT TERM GOAL #2   Title PT core and LE strength increased one grade to allow pt to be able to come sit to stand from a chair without the use of her UE>    Time 4    Period Weeks    Status New      PT SHORT TERM GOAL #3   Title PT to be able to single leg stance on both LE for at least 20 seconds to allow her to feel confident walking in her home with a cane.    Time  4    Period Weeks             PT Long Term Goals - 01/21/20 1709      PT LONG TERM GOAL #1   Title PT to be completing an advanced HEP to allow pt functional ability to increase.    Time 8    Period Weeks    Status New    Target Date 03/18/20      PT LONG TERM GOAL #2   Title PT core and LE strength to be at least a 4+ to allow pt to squat down and pick items off the floor for housework/yardwork and to go up and down 8 steps    Time 8    Period Weeks    Status New      PT LONG TERM GOAL #3   Title Pt to be able to single leg stance for at least 30 seconds to allow pt to feel confident walking in her home without an assistive device.    Time 8    Period Weeks    Status New    Target Date 03/18/20                  Plan - 01/21/20 1702    Clinical Impression Statement Ms. Heier ia a 49 yo female who fell in her home in March of 2020.  She sustained a fx femur and due to poor bone intergrity had to maintain non weight bearing status.  She had two weeks of home health and then the home health therapist requested that she  return to the hospital due to the pt living by herself and was in an unsafe position as she was having difficulty maintaining her nonwt bearing status.  She was discharged to her daughters home but therapy never resumed.  She has gotten to the point where she can walk with with a walker but would like to get back to her pre state of walking with no assistive device.  Evaluation demonstrates decreased strength, decreased balance and increased pain .  Ms. Tessler will benefit from skilled PT to address these issues and return her to her prior level of function.    Personal Factors and Comorbidities Past/Current Experience;Fitness;Time since onset of injury/illness/exacerbation    Examination-Activity Limitations Carry;Dressing;Lift;Locomotion Level;Stairs;Squat    Examination-Participation Restrictions Cleaning;Community Activity;Laundry;Shop    Stability/Clinical  Decision Making Stable/Uncomplicated    Clinical Decision Making Low    Rehab Potential Good    PT Frequency 2x / week    PT Duration 8 weeks    PT Treatment/Interventions Therapeutic activities;Therapeutic exercise;Balance training;Neuromuscular re-education;Stair training;Functional mobility training;Gait training;Patient/family education;Manual techniques    PT Next Visit Plan lunges, step ups, SLS, tandem stance, side steps    PT Home Exercise Plan heel raises, sit to stand, squats and bridges           Patient will benefit from skilled therapeutic intervention in order to improve the following deficits and impairments:  Abnormal gait, Decreased activity tolerance, Decreased balance, Difficulty walking, Decreased strength, Pain  Visit Diagnosis: Muscle weakness (generalized)  Repeated falls  Difficulty in walking, not elsewhere classified  Chronic midline low back pain without sciatica     Problem List Patient Active Problem List   Diagnosis Date Noted  . Encounter for gynecological examination with Papanicolaou smear of cervix 01/14/2020  . Encounter for screening fecal occult blood testing 01/14/2020  . Chronic diarrhea of unknown origin 01/14/2020  . Encounter for screening for malignant neoplasm of cervix 12/29/2019  . Type 2 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, with long-term current use of insulin ([place]) 12/29/2019  . Hypothyroidism 12/29/2019  . Encounter for screening mammogram for malignant neoplasm of breast 12/29/2019  . Acute midline thoracic back pain 12/29/2019  . Loose stools   . Weakness   . Debility 09/10/2019  . ESRD (end stage renal disease) on dialysis ([place]) 09/10/2019  . Depression 09/10/2019  . Incontinence in female 09/10/2019  . Anemia in chronic kidney disease 08/19/2019  . Accelerated hypertension 08/08/2019  . MGUS (monoclonal gammopathy of unknown significance) 07/23/2019  . GERD (gastroesophageal reflux  disease) 02/28/2019  . Hypertension associated with diabetes ([place]) 10/28/2018  . HYPERCHOLESTEROLEMIA 11/19/2008  . Type 2 diabetes with nephropathy [facility]) 07/02/2007    Rayetta Humphrey, PT CLT [phone] 01/22/2020, 8:18 AM  [place], [place], [address] Phone: [phone]   Fax:  [phone]  Name: Melanie Hall MRN: [id number] Date of Birth: 11-10-1970

## 2020-01-23 ENCOUNTER — Other Ambulatory Visit: Payer: Self-pay | Admitting: Physician Assistant

## 2020-01-23 DIAGNOSIS — F32A Depression, unspecified: Secondary | ICD-10-CM

## 2020-01-26 ENCOUNTER — Other Ambulatory Visit: Payer: Self-pay

## 2020-01-26 ENCOUNTER — Ambulatory Visit (HOSPITAL_COMMUNITY): Payer: Medicaid Other | Admitting: Physical Therapy

## 2020-01-26 ENCOUNTER — Encounter (HOSPITAL_COMMUNITY): Payer: Self-pay | Admitting: Physical Therapy

## 2020-01-26 DIAGNOSIS — M545 Low back pain, unspecified: Secondary | ICD-10-CM

## 2020-01-26 DIAGNOSIS — G8929 Other chronic pain: Secondary | ICD-10-CM | POA: Diagnosis not present

## 2020-01-26 DIAGNOSIS — R262 Difficulty in walking, not elsewhere classified: Secondary | ICD-10-CM | POA: Diagnosis not present

## 2020-01-26 DIAGNOSIS — M6281 Muscle weakness (generalized): Secondary | ICD-10-CM | POA: Diagnosis not present

## 2020-01-26 DIAGNOSIS — R296 Repeated falls: Secondary | ICD-10-CM

## 2020-01-26 NOTE — Therapy (Signed)
[place], [place], [address] Phone: [phone]   Fax:  [phone]  Physical Therapy Treatment  Patient Details  Name: Melanie Hall MRN: [id number] Date of Birth: 01-Jun-1971 Referring Provider (PT): Cherly Beach    Encounter Date: 01/26/2020   PT End of Session - 01/26/20 1029    Visit Number 2    Number of Visits 16    Date for PT Re-Evaluation 03/17/20    Authorization Type [employer]- put in for 16 visits    Progress Note Due on Visit 10    PT Start Time 1045    PT Stop Time 1125    PT Time Calculation (min) 40 min    Activity Tolerance Patient tolerated treatment well    Behavior During Therapy [facility] for tasks assessed/performed           Past Medical History:  Diagnosis Date  . Acute cystitis without hematuria   . Acute on chronic renal failure ([place]) 02/28/2019  . Acute systolic heart failure ([place]) 10/21/2019  . Anemia   . Cataracts, bilateral    surgery to remove  . CATARACTS, BILATERAL 07/02/2007   Qualifier: Diagnosis of  By: Isla Pence    . Closed fracture of left femur ([place]) 10/28/2018  . Closed fracture of right ankle 11/06/2017  . Diabetes mellitus    type 2  . Emphysematous cystitis 08/26/2018  . ESRD on hemodialysis ([place])   . GERD (gastroesophageal reflux disease)   . Hyperlipidemia   . Hypertension   . IRREGULAR MENSES 09/14/2009   Qualifier: Diagnosis of  By: Hassell Done FNP, Tori Milks    . Normocytic anemia 08/26/2018  . PARONYCHIA, RIGHT GREAT TOE 07/30/2008   Qualifier: Diagnosis of  By: Hassell Done FNP, Tori Milks    . Pressure ulcer 09/10/2019  . Right arm weakness 08/08/2019  . Sprain of left ankle   . STEMI (ST elevation myocardial infarction) ([place]) 10/18/2019  . STEMI involving right coronary artery ([place]) 10/18/2019  . Stroke ([place])   . SVD (spontaneous vaginal delivery)    x 4  . Vaginosis 08/26/2018  . Weakness of both lower extremities     Past Surgical History:  Procedure  Laterality Date  . A/V FISTULAGRAM Left 01/12/2020   Procedure: A/V FISTULAGRAM;  Surgeon: Serafina Mitchell, MD;  Location: [place] [facility];  Service: Cardiovascular;  Laterality: Left;  . AV FISTULA PLACEMENT Left 08/17/2019   Procedure: LEFT BRACHIAL CEPHALIC ARTERIOVENOUS (AV) FISTULA;  Surgeon: Angelia Mould, MD;  Location: [place];  Service: Vascular;  Laterality: Left;  . CORONARY STENT INTERVENTION N/A 10/18/2019   Procedure: CORONARY STENT INTERVENTION;  Surgeon: Sherren Mocha, MD;  Location: [place] [facility];  Service: Cardiovascular;  Laterality: N/A;  . CORONARY/GRAFT ACUTE MI REVASCULARIZATION N/A 10/18/2019   Procedure: Coronary/Graft Acute MI Revascularization;  Surgeon: Sherren Mocha, MD;  Location: [place] [facility];  Service: Cardiovascular;  Laterality: N/A;  . EYE SURGERY Bilateral    cataracts removed  . FEMUR IM NAIL Left 10/28/2018   Procedure: RETROGRADE FEMORAL NAILING;  Surgeon: Meredith Pel, MD;  Location: [place];  Service: Orthopedics;  Laterality: Left;  . IM NAILING FEMORAL SHAFT RETROGRADE Left 10/28/2018  . INTRAVASCULAR ULTRASOUND/IVUS N/A 10/18/2019   Procedure: Intravascular Ultrasound/IVUS;  Surgeon: Sherren Mocha, MD;  Location: [place] [facility];  Service: Cardiovascular;  Laterality: N/A;  . IR FLUORO GUIDE CV LINE RIGHT  08/11/2019  . IR THORACENTESIS ASP PLEURAL SPACE W/IMG GUIDE  11/12/2019  . IR US GUIDE VASC ACCESS RIGHT  08/11/2019  . KNEE SURGERY Left   . LEFT HEART CATH AND CORONARY ANGIOGRAPHY N/A 10/18/2019   Procedure: LEFT HEART CATH AND CORONARY ANGIOGRAPHY;  Surgeon: Sherren Mocha, MD;  Location: [place] [facility];  Service: Cardiovascular;  Laterality: N/A;  . PERIPHERAL VASCULAR BALLOON ANGIOPLASTY Left 01/12/2020   Procedure: PERIPHERAL VASCULAR BALLOON ANGIOPLASTY;  Surgeon: Serafina Mitchell, MD;  Location: [place] [facility];  Service: Cardiovascular;  Laterality: Left;  AVF  . RADIOLOGY WITH ANESTHESIA N/A 09/15/2019    Procedure: [facility] AND LUMBER LOWER BACK PAIN;  Surgeon: Radiologist, Medication, MD;  Location: [place];  Service: Radiology;  Laterality: N/A;  . TUBAL LIGATION      There were no vitals filed for this visit.   Subjective Assessment - 01/26/20 1050    Subjective Pain comes and goes and is  currently about 8/10. States sometimes her pain is like a 20/10 it just depends on things. States she is wearing her current shoes as they are easy to slip on.    Pertinent History HTN,DM, CVA, femoral nailing 10/2018, LE weakness on dialysis (MWF)    Limitations Lifting;Standing;Walking;House hold activities    How long can you sit comfortably? no problem    How long can you walk comfortably? 3 hours using a walker or cart.    Patient Stated Goals to be off her walker    Currently in Pain? Yes    Pain Score 8     Pain Location Back    Pain Orientation Lower;Mid    Pain Descriptors / Indicators Throbbing    Pain Type Chronic pain    Pain Onset More than a month ago              [facility] PT Assessment - 01/26/20 0001      Assessment   Medical Diagnosis LE weakness, gait instability and back pain     Referring Provider (PT) Cherly Beach     Onset Date/Surgical Date 10/27/18    Next MD Visit not scheduled                          [facility] Adult PT Treatment/Exercise - 01/26/20 0001      Ambulation/Gait   Ambulation/Gait Yes    Ambulation/Gait Assistance 5: Supervision    Ambulation Distance (Feet) 226 Feet    Assistive device Rolling walker      Knee/Hip Exercises: Stretches   Gastroc Stretch 3 reps;10 seconds;Both   strap - then with towel roll standign x3 10" R-not tolerated     Knee/Hip Exercises: Standing   Other Standing Knee Exercises side stepping at table - heavy use of UE for support - fatigued quickly 5x1 - pain  B       Knee/Hip Exercises: Seated   Long Arc Quad AROM;Strengthening;Both;3 sets;10 reps   2" holds   Sit to [employer] 10 reps;with UE support   3 sets                    PT Education - 01/26/20 1054    Education Details in proper shoe wear to decrease fall risk - current shoes - heels are hanging out of shoes and patient has to pick up feet with feet level to keep them on her feet.; in following up with MD about walker.    Person(s) Educated Patient    Methods Explanation    Comprehension  Verbalized understanding            PT Short Term Goals - 01/21/20 1708      PT SHORT TERM GOAL #1   Title Pt to be I in her HEP to improve her functioning level.    Time 4    Period Weeks    Status New    Target Date 02/19/20      PT SHORT TERM GOAL #2   Title PT core and LE strength increased one grade to allow pt to be able to come sit to stand from a chair without the use of her UE>    Time 4    Period Weeks    Status New      PT SHORT TERM GOAL #3   Title PT to be able to single leg stance on both LE for at least 20 seconds to allow her to feel confident walking in her home with a cane.    Time 4    Period Weeks             PT Long Term Goals - 01/21/20 1709      PT LONG TERM GOAL #1   Title PT to be completing an advanced HEP to allow pt functional ability to increase.    Time 8    Period Weeks    Status New    Target Date 03/18/20      PT LONG TERM GOAL #2   Title PT core and LE strength to be at least a 4+ to allow pt to squat down and pick items off the floor for housework/yardwork and to go up and down 8 steps    Time 8    Period Weeks    Status New      PT LONG TERM GOAL #3   Title Pt to be able to single leg stance for at least 30 seconds to allow pt to feel confident walking in her home without an assistive device.    Time 8    Period Weeks    Status New    Target Date 03/18/20                 Plan - 01/26/20 1055    Clinical Impression Statement Started with side stepping at table with upper extremity support. Patient unable to stand upright and walks on the lateral sides of her feet, had to rest  after each rep secondary to pain and weakness. Reports 25/10 pain after 5 reps. Transitioned to seated strengthening exercises and this was tolerated better. Patient has limitations in bilateral ankle mobility in DF, challenging to get a stretch secondary to limitations in feeling and balance and poor tolerance to standing.  Required long rest breaks secondary to pain, fatigue and challenge of exercises.    Personal Factors and Comorbidities Past/Current Experience;Fitness;Time since onset of injury/illness/exacerbation    Examination-Activity Limitations Carry;Dressing;Lift;Locomotion Level;Stairs;Squat    Examination-Participation Restrictions Cleaning;Community Activity;Laundry;Shop    Stability/Clinical Decision Making Stable/Uncomplicated    Rehab Potential Good    PT Frequency 2x / week    PT Duration 8 weeks    PT Treatment/Interventions Therapeutic activities;Therapeutic exercise;Balance training;Neuromuscular re-education;Stair training;Functional mobility training;Gait training;Patient/family education;Manual techniques    PT Next Visit Plan lunges, step ups, SLS, tandem stance, side steps    PT Home Exercise Plan heel raises, sit to stand, squats and bridges; 7/20 LAQs           Patient will benefit from skilled therapeutic intervention  in order to improve the following deficits and impairments:  Abnormal gait, Decreased activity tolerance, Decreased balance, Difficulty walking, Decreased strength, Pain  Visit Diagnosis: Muscle weakness (generalized)  Repeated falls  Difficulty in walking, not elsewhere classified  Chronic midline low back pain without sciatica     Problem List Patient Active Problem List   Diagnosis Date Noted  . Encounter for gynecological examination with Papanicolaou smear of cervix 01/14/2020  . Encounter for screening fecal occult blood testing 01/14/2020  . Chronic diarrhea of unknown origin 01/14/2020  . Encounter for screening for malignant  neoplasm of cervix 12/29/2019  . Type 2 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, with long-term current use of insulin ([place]) 12/29/2019  . Hypothyroidism 12/29/2019  . Encounter for screening mammogram for malignant neoplasm of breast 12/29/2019  . Acute midline thoracic back pain 12/29/2019  . Loose stools   . Weakness   . Debility 09/10/2019  . ESRD (end stage renal disease) on dialysis ([place]) 09/10/2019  . Depression 09/10/2019  . Incontinence in female 09/10/2019  . Anemia in chronic kidney disease 08/19/2019  . Accelerated hypertension 08/08/2019  . MGUS (monoclonal gammopathy of unknown significance) 07/23/2019  . GERD (gastroesophageal reflux disease) 02/28/2019  . Hypertension associated with diabetes ([place]) 10/28/2018  . HYPERCHOLESTEROLEMIA 11/19/2008  . Type 2 diabetes with nephropathy ([place]) 07/02/2007    11:32 AM, 01/26/20 Jerene Pitch, DPT Physical Therapy with [facility]  [phone] office  [place] [address] [place], [place], [address] Phone: [phone]   Fax:  [phone]  Name: Melanie Hall MRN: [id number] Date of Birth: [birth year]

## 2020-01-27 ENCOUNTER — Ambulatory Visit (HOSPITAL_COMMUNITY): Payer: Medicaid Other

## 2020-01-27 ENCOUNTER — Ambulatory Visit (INDEPENDENT_AMBULATORY_CARE_PROVIDER_SITE_OTHER): Payer: Medicaid Other | Admitting: Gastroenterology

## 2020-01-28 ENCOUNTER — Ambulatory Visit (HOSPITAL_COMMUNITY): Payer: Medicaid Other

## 2020-01-28 ENCOUNTER — Encounter (HOSPITAL_COMMUNITY): Payer: Medicaid Other

## 2020-01-28 ENCOUNTER — Ambulatory Visit: Payer: Medicaid Other | Admitting: Family Medicine

## 2020-01-28 ENCOUNTER — Telehealth (HOSPITAL_COMMUNITY): Payer: Self-pay

## 2020-01-28 NOTE — Telephone Encounter (Signed)
pt cancelled her appt due to transportion issues

## 2020-02-01 ENCOUNTER — Telehealth: Payer: Self-pay | Admitting: Physician Assistant

## 2020-02-01 NOTE — Telephone Encounter (Signed)
Called pt's pharmacy to inquired about the medications requested. Pharmacy stated that pt is requesting these medication to soon and will not be able to refill until August 16, because pt has gotten a 90 day supply on 11/24/19. I called the pt to inform her of what the pharmacy stated and pt verbalized understanding.

## 2020-02-01 NOTE — Telephone Encounter (Signed)
*  STAT* If patient is at the pharmacy, call can be transferred to refill team.   1. Which medications need to be refilled? (please list name of each medication and dose if known)? atorvastatin (LIPITOR) 80 MG tablet ticagrelor (BRILINTA) 90 MG TABS tablet isosorbide mononitrate (IMDUR) 30 MG 24 hr tablet nitroGLYCERIN (NITROSTAT) 0.4 MG SL tab hydrALAZINE (APRESOLINE) 10 MG tablet  2. Which pharmacy/location (including street and city if local pharmacy) is medication to be sent to?  [place], [place] [address]  3. Do they need a 30 day or 90 day supply? 90 day

## 2020-02-02 ENCOUNTER — Encounter (HOSPITAL_COMMUNITY): Payer: Self-pay | Admitting: Physical Therapy

## 2020-02-02 ENCOUNTER — Encounter (HOSPITAL_COMMUNITY): Payer: Self-pay

## 2020-02-02 ENCOUNTER — Ambulatory Visit (HOSPITAL_COMMUNITY): Payer: Medicaid Other | Admitting: Physical Therapy

## 2020-02-02 ENCOUNTER — Telehealth (HOSPITAL_COMMUNITY): Payer: Self-pay | Admitting: Physical Therapy

## 2020-02-02 ENCOUNTER — Other Ambulatory Visit: Payer: Self-pay

## 2020-02-02 DIAGNOSIS — M545 Low back pain, unspecified: Secondary | ICD-10-CM

## 2020-02-02 DIAGNOSIS — R262 Difficulty in walking, not elsewhere classified: Secondary | ICD-10-CM | POA: Diagnosis not present

## 2020-02-02 DIAGNOSIS — R296 Repeated falls: Secondary | ICD-10-CM | POA: Diagnosis not present

## 2020-02-02 DIAGNOSIS — M6281 Muscle weakness (generalized): Secondary | ICD-10-CM

## 2020-02-02 DIAGNOSIS — G8929 Other chronic pain: Secondary | ICD-10-CM | POA: Diagnosis not present

## 2020-02-02 NOTE — Therapy (Signed)
[place], [place], [address] Phone: [phone]   Fax:  [phone]  Physical Therapy Treatment  Patient Details  Name: Melanie Hall MRN: [id number] Date of Birth: July 13, 1970 Referring Provider (PT): Cherly Beach    Encounter Date: 02/02/2020   PT End of Session - 02/02/20 1046    Visit Number 3    Number of Visits 16    Date for PT Re-Evaluation 03/17/20    Authorization Type [employer]- approved 24 visits 01/22/20 to 03/17/20    Authorization - Visit Number 2    Authorization - Number of Visits 24    Progress Note Due on Visit 10    PT Start Time 1046    PT Stop Time 4970    PT Time Calculation (min) 40 min    Activity Tolerance Patient tolerated treatment well    Behavior During Therapy [facility] for tasks assessed/performed           Past Medical History:  Diagnosis Date   Acute cystitis without hematuria    Acute on chronic renal failure ([place]) 2/63/7858   Acute systolic heart failure ([place]) 10/21/2019   Anemia    Cataracts, bilateral    surgery to remove   CATARACTS, BILATERAL 07/02/2007   Qualifier: Diagnosis of  By: Isla Pence     Closed fracture of left femur ([place]) 10/28/2018   Closed fracture of right ankle 11/06/2017   Diabetes mellitus    type 2   Emphysematous cystitis 08/26/2018   ESRD on hemodialysis ([place])    GERD (gastroesophageal reflux disease)    Hyperlipidemia    Hypertension    IRREGULAR MENSES 09/14/2009   Qualifier: Diagnosis of  By: Hassell Done FNP, [place]     Normocytic anemia 08/26/2018   PARONYCHIA, RIGHT GREAT TOE 07/30/2008   Qualifier: Diagnosis of  By: Hassell Done FNP, Nykedtra     Pressure ulcer 09/10/2019   Right arm weakness 08/08/2019   Sprain of left ankle    STEMI (ST elevation myocardial infarction) ([place]) 10/18/2019   STEMI involving right coronary artery ([place]) 10/18/2019   Stroke ([place])    SVD (spontaneous vaginal delivery)    x 4   Vaginosis  08/26/2018   Weakness of both lower extremities     Past Surgical History:  Procedure Laterality Date   A/V FISTULAGRAM Left 01/12/2020   Procedure: A/V FISTULAGRAM;  Surgeon: Serafina Mitchell, MD;  Location: [place] [facility];  Service: Cardiovascular;  Laterality: Left;   AV FISTULA PLACEMENT Left 08/17/2019   Procedure: LEFT BRACHIAL CEPHALIC ARTERIOVENOUS (AV) FISTULA;  Surgeon: Angelia Mould, MD;  Location: [place];  Service: Vascular;  Laterality: Left;   CORONARY STENT INTERVENTION N/A 10/18/2019   Procedure: CORONARY STENT INTERVENTION;  Surgeon: Sherren Mocha, MD;  Location: [place] [facility];  Service: Cardiovascular;  Laterality: N/A;   CORONARY/GRAFT ACUTE MI REVASCULARIZATION N/A 10/18/2019   Procedure: Coronary/Graft Acute MI Revascularization;  Surgeon: Sherren Mocha, MD;  Location: [place] [facility];  Service: Cardiovascular;  Laterality: N/A;   EYE SURGERY Bilateral    cataracts removed   FEMUR IM NAIL Left 10/28/2018   Procedure: RETROGRADE FEMORAL NAILING;  Surgeon: Meredith Pel, MD;  Location: [place];  Service: Orthopedics;  Laterality: Left;   IM NAILING FEMORAL SHAFT RETROGRADE Left 10/28/2018   INTRAVASCULAR ULTRASOUND/IVUS N/A 10/18/2019   Procedure: Intravascular Ultrasound/IVUS;  Surgeon: Sherren Mocha, MD;  Location: [place] [facility];  Service: Cardiovascular;  Laterality: N/A;  IR FLUORO GUIDE CV LINE RIGHT  08/11/2019   IR THORACENTESIS ASP PLEURAL SPACE W/IMG GUIDE  11/12/2019   IR US GUIDE VASC ACCESS RIGHT  08/11/2019   KNEE SURGERY Left    LEFT HEART CATH AND CORONARY ANGIOGRAPHY N/A 10/18/2019   Procedure: LEFT HEART CATH AND CORONARY ANGIOGRAPHY;  Surgeon: Sherren Mocha, MD;  Location: [place] [facility];  Service: Cardiovascular;  Laterality: N/A;   PERIPHERAL VASCULAR BALLOON ANGIOPLASTY Left 01/12/2020   Procedure: PERIPHERAL VASCULAR BALLOON ANGIOPLASTY;  Surgeon: Serafina Mitchell, MD;  Location: [place] [facility];  Service:  Cardiovascular;  Laterality: Left;  AVF   RADIOLOGY WITH ANESTHESIA N/A 09/15/2019   Procedure: [facility] AND LUMBER LOWER BACK PAIN;  Surgeon: Radiologist, Medication, MD;  Location: [place];  Service: Radiology;  Laterality: N/A;   TUBAL LIGATION      There were no vitals filed for this visit.   Subjective Assessment - 02/02/20 1049    Subjective States that she feels about the same. Reports abotu 8/10 pain    Pertinent History HTN,DM, CVA, femoral nailing 10/2018, LE weakness on dialysis (MWF)    Limitations Lifting;Standing;Walking;House hold activities    How long can you sit comfortably? no problem    How long can you walk comfortably? 3 hours using a walker or cart.    Patient Stated Goals to be off her walker    Currently in Pain? Yes    Pain Score 8     Pain Location Back    Pain Orientation Lower    Pain Descriptors / Indicators Throbbing    Pain Onset More than a month ago              [facility] PT Assessment - 02/02/20 0001      Assessment   Medical Diagnosis LE weakness, gait instability and back pain     Referring Provider (PT) Cherly Beach     Onset Date/Surgical Date 10/27/18                         [facility] Adult PT Treatment/Exercise - 02/02/20 0001      Self-Care   Self-Care Other Self-Care Comments    Other Self-Care Comments  educated and practiced don/doffing tennis shoes with cues to pull laces through to increase space to don shoes. - practiced tightening laces and then tucking laces instead of tying shoes (unable due to arthritis in hands)      Knee/Hip Exercises: Standing   Hip Flexion 3 sets;Both;Stengthening;AROM   marching, in // bars with B UE support - 8 reps    Hip Abduction AROM;Stengthening;Both;3 sets;Knee straight   8 reps, in // bars with B UE support   Hip Extension Both;Stengthening;AROM;3 sets;Knee straight   8 reps, in // bars with B UE support   Other Standing Knee Exercises side stepping in // bars x5 B with B UE support     Other Standing Knee Exercises tandem  in // bars 2x30" B 1 UE support      Knee/Hip Exercises: Seated   Other Seated Knee/Hip Exercises attempted diaphragmatic breathing - too much pain to attempt more then 3 reps - unable to complete with proper form    Other Seated Knee/Hip Exercises self traction in seated position - not tolerated well                     PT Short Term Goals - 01/21/20 1708  PT SHORT TERM GOAL #1   Title Pt to be I in her HEP to improve her functioning level.    Time 4    Period Weeks    Status New    Target Date 02/19/20      PT SHORT TERM GOAL #2   Title PT core and LE strength increased one grade to allow pt to be able to come sit to stand from a chair without the use of her UE>    Time 4    Period Weeks    Status New      PT SHORT TERM GOAL #3   Title PT to be able to single leg stance on both LE for at least 20 seconds to allow her to feel confident walking in her home with a cane.    Time 4    Period Weeks             PT Long Term Goals - 01/21/20 1709      PT LONG TERM GOAL #1   Title PT to be completing an advanced HEP to allow pt functional ability to increase.    Time 8    Period Weeks    Status New    Target Date 03/18/20      PT LONG TERM GOAL #2   Title PT core and LE strength to be at least a 4+ to allow pt to squat down and pick items off the floor for housework/yardwork and to go up and down 8 steps    Time 8    Period Weeks    Status New      PT LONG TERM GOAL #3   Title Pt to be able to single leg stance for at least 30 seconds to allow pt to feel confident walking in her home without an assistive device.    Time 8    Period Weeks    Status New    Target Date 03/18/20                 Plan - 02/02/20 1126    Clinical Impression Statement Patient reported pulling in back with standing exercises like a stretch. Fatigue noted throughout session so resting breaks were take.  Overall tolerance to exercises  was much improved compared to previous session. Cues throughout session for form and posture. Towards end of session pain limited ability to participate in lateral side stepping. Attempted diaphragmatic breathing but unable to perform on this date. Patient denied positional relief, modalities (heat/ice) and stated pain usually just subsides on its own.    Personal Factors and Comorbidities Past/Current Experience;Fitness;Time since onset of injury/illness/exacerbation    Examination-Activity Limitations Carry;Dressing;Lift;Locomotion Level;Stairs;Squat    Examination-Participation Restrictions Cleaning;Community Activity;Laundry;Shop    Stability/Clinical Decision Making Stable/Uncomplicated    Rehab Potential Good    PT Frequency 2x / week    PT Duration 8 weeks    PT Treatment/Interventions Therapeutic activities;Therapeutic exercise;Balance training;Neuromuscular re-education;Stair training;Functional mobility training;Gait training;Patient/family education;Manual techniques    PT Next Visit Plan lunges, step ups, SLS, tandem stance, side steps    PT Home Exercise Plan heel raises, sit to stand, squats and bridges; 7/20 LAQs    Consulted and Agree with Plan of Care Patient           Patient will benefit from skilled therapeutic intervention in order to improve the following deficits and impairments:  Abnormal gait, Decreased activity tolerance, Decreased balance, Difficulty walking, Decreased strength, Pain  Visit Diagnosis: Muscle weakness (  generalized)  Repeated falls  Difficulty in walking, not elsewhere classified  Chronic midline low back pain without sciatica     Problem List Patient Active Problem List   Diagnosis Date Noted   Encounter for gynecological examination with Papanicolaou smear of cervix 01/14/2020   Encounter for screening fecal occult blood testing 01/14/2020   Chronic diarrhea of unknown origin 01/14/2020   Encounter for screening for malignant  neoplasm of cervix 12/29/2019   Type 2 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, with long-term current use of insulin ([place]) 12/29/2019   Hypothyroidism 12/29/2019   Encounter for screening mammogram for malignant neoplasm of breast 12/29/2019   Acute midline thoracic back pain 12/29/2019   Loose stools    Weakness    Debility 09/10/2019   ESRD (end stage renal disease) on dialysis ([place]) 09/10/2019   Depression 09/10/2019   Incontinence in female 09/10/2019   Anemia in chronic kidney disease 08/19/2019   Accelerated hypertension 08/08/2019   MGUS (monoclonal gammopathy of unknown significance) 07/23/2019   GERD (gastroesophageal reflux disease) 02/28/2019   Hypertension associated with diabetes ([place]) 10/28/2018   HYPERCHOLESTEROLEMIA 11/19/2008   Type 2 diabetes with nephropathy ([place]) 07/02/2007   11:27 AM, 02/02/20 Jerene Pitch, DPT Physical Therapy with [facility]  [phone] office  [place] [address] [place], [place], [address] Phone: [phone]   Fax:  [phone]  Name: Melanie Hall MRN: [id number] Date of Birth: 05/24/1971

## 2020-02-02 NOTE — Telephone Encounter (Signed)
Called regarding patient not showing for appointment this date. Reminded of next scheduled appointment and provided clinic phone number.  Clarene Critchley PT, DPT 9:44 AM, 02/02/20 [phone]

## 2020-02-04 ENCOUNTER — Ambulatory Visit (HOSPITAL_COMMUNITY)
Admission: RE | Admit: 2020-02-04 | Discharge: 2020-02-04 | Disposition: A | Discharge status: Home / Self Care | Payer: Medicaid Other | Source: Ambulatory Visit | Attending: Family Medicine | Admitting: Family Medicine

## 2020-02-04 ENCOUNTER — Other Ambulatory Visit: Payer: Self-pay

## 2020-02-04 ENCOUNTER — Telehealth: Payer: Self-pay | Admitting: Physician Assistant

## 2020-02-04 ENCOUNTER — Encounter (HOSPITAL_COMMUNITY): Payer: Self-pay | Admitting: Physical Therapy

## 2020-02-04 ENCOUNTER — Ambulatory Visit (HOSPITAL_COMMUNITY): Payer: Medicaid Other | Admitting: Physical Therapy

## 2020-02-04 DIAGNOSIS — M6281 Muscle weakness (generalized): Secondary | ICD-10-CM | POA: Diagnosis not present

## 2020-02-04 DIAGNOSIS — G8929 Other chronic pain: Secondary | ICD-10-CM | POA: Diagnosis not present

## 2020-02-04 DIAGNOSIS — M545 Low back pain, unspecified: Secondary | ICD-10-CM

## 2020-02-04 DIAGNOSIS — Z1231 Encounter for screening mammogram for malignant neoplasm of breast: Secondary | ICD-10-CM | POA: Diagnosis not present

## 2020-02-04 DIAGNOSIS — R296 Repeated falls: Secondary | ICD-10-CM

## 2020-02-04 DIAGNOSIS — R262 Difficulty in walking, not elsewhere classified: Secondary | ICD-10-CM

## 2020-02-04 IMAGING — MG DIGITAL SCREENING BILAT W/ TOMO W/ CAD
6 of 12 series · 6 of 36 positions shown · non-contrast
Comparison: None.

CLINICAL DATA: Screening.

EXAM:
DIGITAL SCREENING BILATERAL MAMMOGRAM WITH TOMO AND CAD

[L MLO synth-2D]
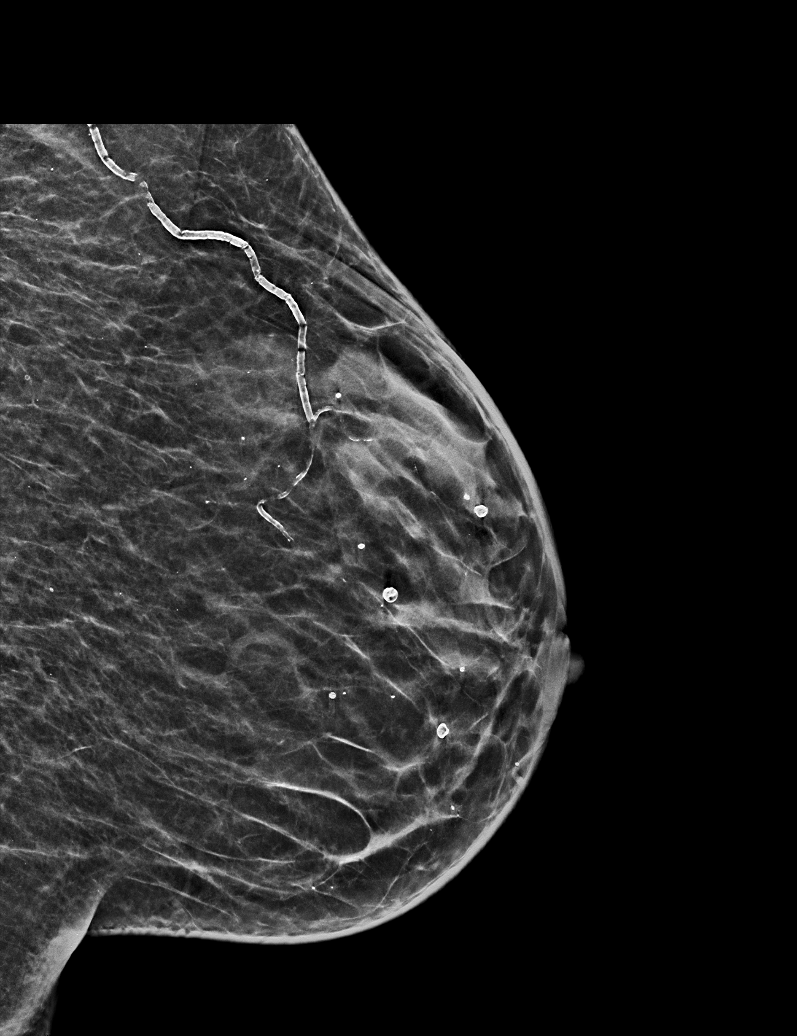

[R MLO synth-2D (1 of 3)]
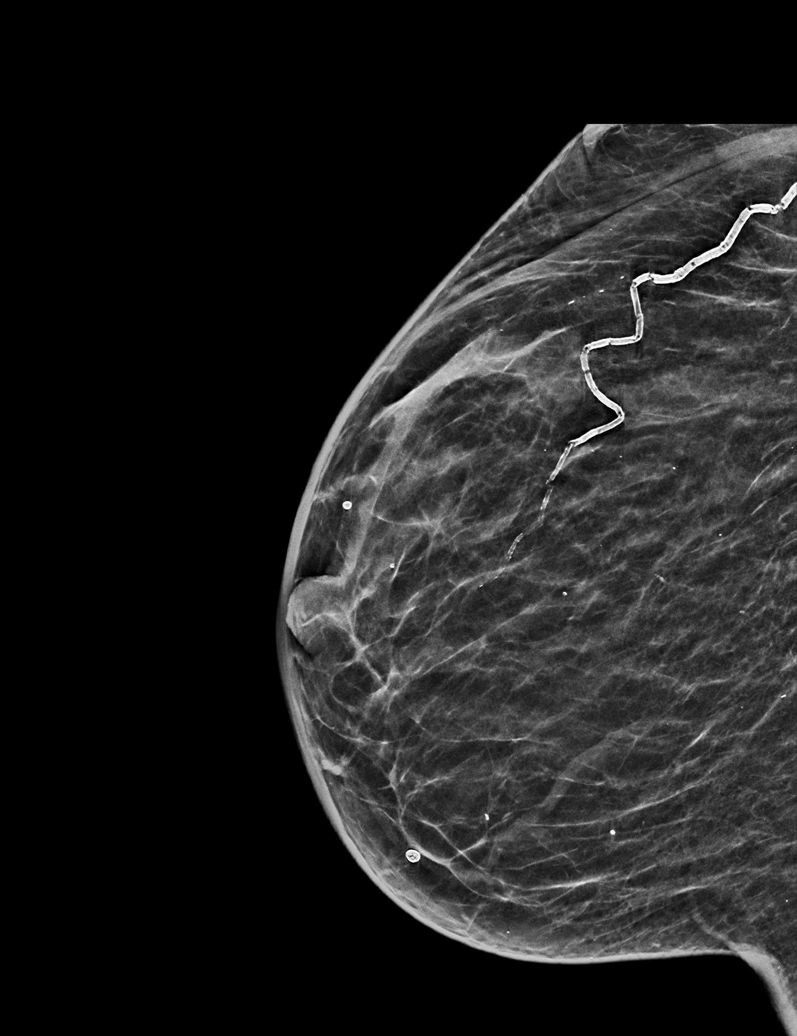

[R CC synth-2D]
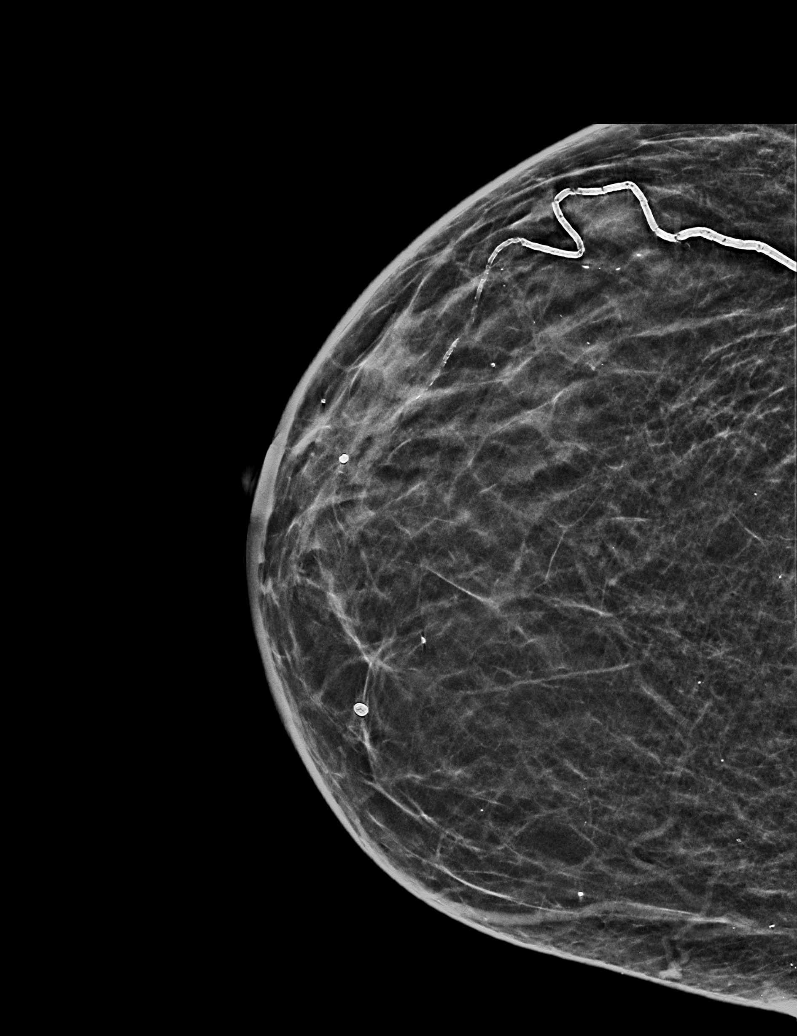

[R MLO synth-2D (2 of 3)]
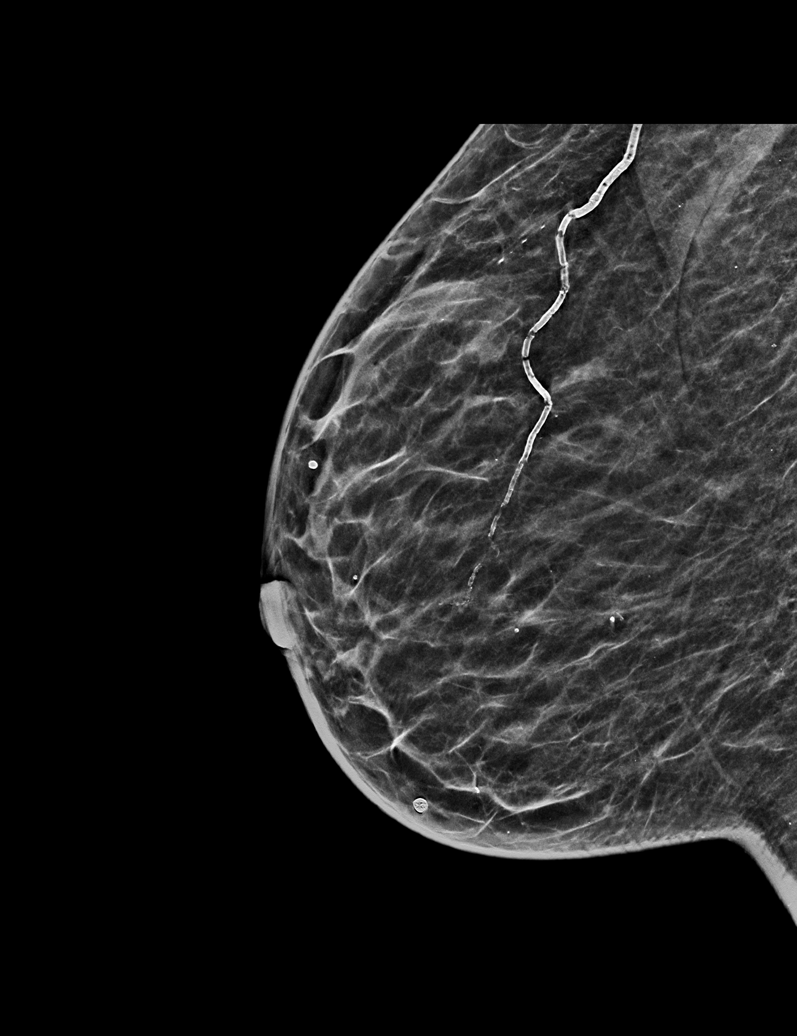

[L CC synth-2D]
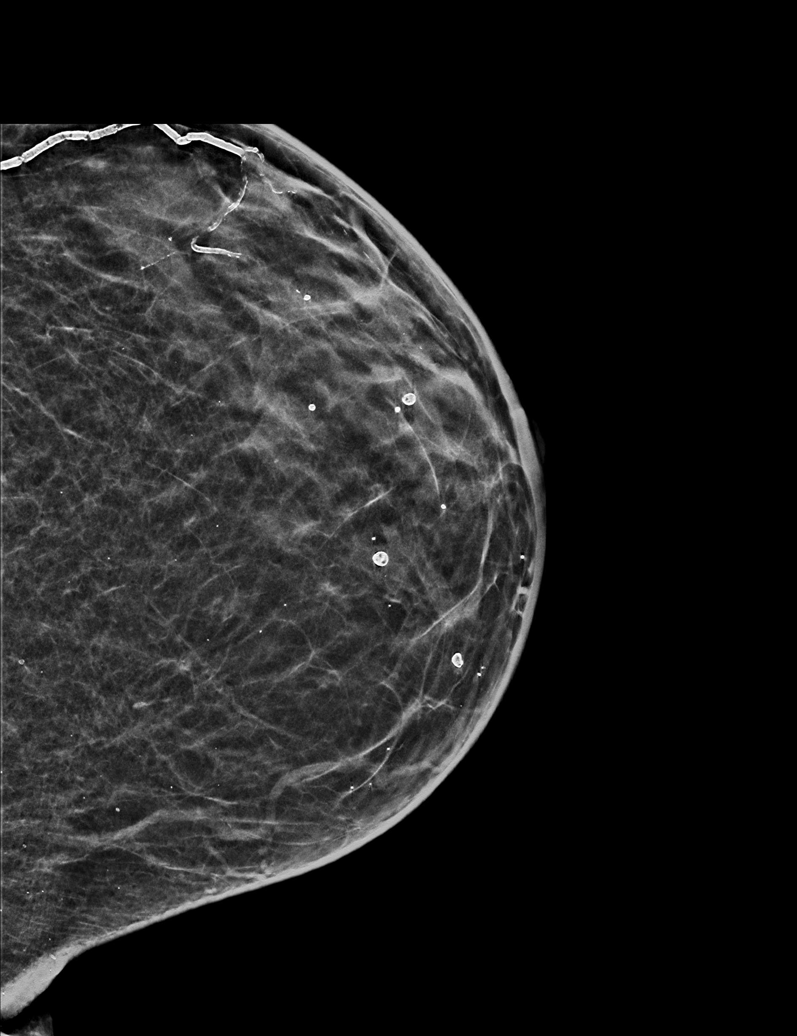

[R MLO synth-2D (3 of 3)]
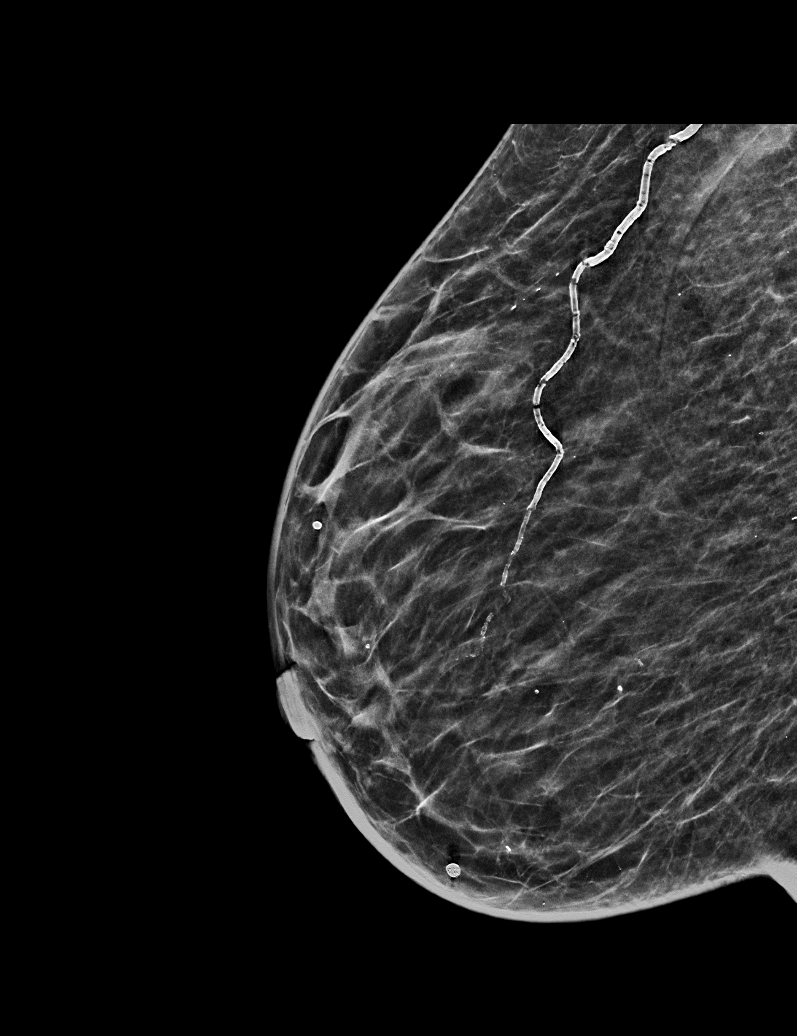

[6 of 36 positions shown; findings below may reference images not displayed]

ACR Breast Density Category c: The breast tissue is heterogeneously
dense, which may obscure small masses
FINDINGS: There are no findings suspicious for malignancy. Images were
processed with CAD.
IMPRESSION: No mammographic evidence of malignancy. A result letter of this
screening mammogram will be mailed directly to the patient.

RECOMMENDATION:
Screening mammogram in one year. (Code:[4W])

BI-RADS CATEGORY  1: Negative.

## 2020-02-04 NOTE — Telephone Encounter (Signed)
Patient is returning call.  °

## 2020-02-04 NOTE — Telephone Encounter (Signed)
lmtcb

## 2020-02-04 NOTE — Telephone Encounter (Signed)
lmtcb  (advised pt to have scheduler call me/triage nurse when pt calls back, so that hopefully we don't play anymore "phone tag")

## 2020-02-04 NOTE — Therapy (Signed)
[place], [place], [address] Phone: [phone]   Fax:  [phone]  Physical Therapy Treatment  Patient Details  Name: Melanie Hall MRN: [id number] Date of Birth: 08-01-1970 Referring Provider (PT): Cherly Beach    Encounter Date: 02/04/2020   PT End of Session - 02/04/20 1039    Visit Number 4    Number of Visits 16    Date for PT Re-Evaluation 03/17/20    Authorization Type [employer]- approved 24 visits 01/22/20 to 03/17/20    Authorization - Visit Number 3    Authorization - Number of Visits 24    Progress Note Due on Visit 10    PT Start Time 8786    PT Stop Time 1123    PT Time Calculation (min) 38 min    Activity Tolerance Patient tolerated treatment well    Behavior During Therapy [facility] for tasks assessed/performed           Past Medical History:  Diagnosis Date  . Acute cystitis without hematuria   . Acute on chronic renal failure ([place]) 02/28/2019  . Acute systolic heart failure ([place]) 10/21/2019  . Anemia   . Cataracts, bilateral    surgery to remove  . CATARACTS, BILATERAL 07/02/2007   Qualifier: Diagnosis of  By: Isla Pence    . Closed fracture of left femur ([place]) 10/28/2018  . Closed fracture of right ankle 11/06/2017  . Diabetes mellitus    type 2  . Emphysematous cystitis 08/26/2018  . ESRD on hemodialysis ([place])   . GERD (gastroesophageal reflux disease)   . Hyperlipidemia   . Hypertension   . IRREGULAR MENSES 09/14/2009   Qualifier: Diagnosis of  By: Hassell Done FNP, Tori Milks    . Normocytic anemia 08/26/2018  . PARONYCHIA, RIGHT GREAT TOE 07/30/2008   Qualifier: Diagnosis of  By: Hassell Done FNP, Tori Milks    . Pressure ulcer 09/10/2019  . Right arm weakness 08/08/2019  . Sprain of left ankle   . STEMI (ST elevation myocardial infarction) ([place]) 10/18/2019  . STEMI involving right coronary artery ([place]) 10/18/2019  . Stroke ([place])   . SVD (spontaneous vaginal delivery)    x 4  . Vaginosis  08/26/2018  . Weakness of both lower extremities     Past Surgical History:  Procedure Laterality Date  . A/V FISTULAGRAM Left 01/12/2020   Procedure: A/V FISTULAGRAM;  Surgeon: Serafina Mitchell, MD;  Location: [place] [facility];  Service: Cardiovascular;  Laterality: Left;  . AV FISTULA PLACEMENT Left 08/17/2019   Procedure: LEFT BRACHIAL CEPHALIC ARTERIOVENOUS (AV) FISTULA;  Surgeon: Angelia Mould, MD;  Location: [place];  Service: Vascular;  Laterality: Left;  . CORONARY STENT INTERVENTION N/A 10/18/2019   Procedure: CORONARY STENT INTERVENTION;  Surgeon: Sherren Mocha, MD;  Location: [place] [facility];  Service: Cardiovascular;  Laterality: N/A;  . CORONARY/GRAFT ACUTE MI REVASCULARIZATION N/A 10/18/2019   Procedure: Coronary/Graft Acute MI Revascularization;  Surgeon: Sherren Mocha, MD;  Location: [place] [facility];  Service: Cardiovascular;  Laterality: N/A;  . EYE SURGERY Bilateral    cataracts removed  . FEMUR IM NAIL Left 10/28/2018   Procedure: RETROGRADE FEMORAL NAILING;  Surgeon: Meredith Pel, MD;  Location: [place];  Service: Orthopedics;  Laterality: Left;  . IM NAILING FEMORAL SHAFT RETROGRADE Left 10/28/2018  . INTRAVASCULAR ULTRASOUND/IVUS N/A 10/18/2019   Procedure: Intravascular Ultrasound/IVUS;  Surgeon: Sherren Mocha, MD;  Location: [place] [facility];  Service: Cardiovascular;  Laterality: N/A;  .  IR FLUORO GUIDE CV LINE RIGHT  08/11/2019  . IR THORACENTESIS ASP PLEURAL SPACE W/IMG GUIDE  11/12/2019  . IR US GUIDE VASC ACCESS RIGHT  08/11/2019  . KNEE SURGERY Left   . LEFT HEART CATH AND CORONARY ANGIOGRAPHY N/A 10/18/2019   Procedure: LEFT HEART CATH AND CORONARY ANGIOGRAPHY;  Surgeon: Sherren Mocha, MD;  Location: [place] [facility];  Service: Cardiovascular;  Laterality: N/A;  . PERIPHERAL VASCULAR BALLOON ANGIOPLASTY Left 01/12/2020   Procedure: PERIPHERAL VASCULAR BALLOON ANGIOPLASTY;  Surgeon: Serafina Mitchell, MD;  Location: [place] [facility];  Service:  Cardiovascular;  Laterality: Left;  AVF  . RADIOLOGY WITH ANESTHESIA N/A 09/15/2019   Procedure: [facility] AND LUMBER LOWER BACK PAIN;  Surgeon: Radiologist, Medication, MD;  Location: [place];  Service: Radiology;  Laterality: N/A;  . TUBAL LIGATION      There were no vitals filed for this visit.   Subjective Assessment - 02/04/20 1040    Subjective STates she is currently about a 5/10 and at times when she walks a lot her pain increases. Reports no increase in pain noted. STates she got new shoes that are easier to get on and off and have a back to them.    Pertinent History HTN,DM, CVA, femoral nailing 10/2018, LE weakness on dialysis (MWF)    Limitations Lifting;Standing;Walking;House hold activities    How long can you sit comfortably? no problem    How long can you walk comfortably? 3 hours using a walker or cart.    Patient Stated Goals to be off her walker    Currently in Pain? Yes    Pain Score 5     Pain Onset More than a month ago              [facility] PT Assessment - 02/04/20 0001      Assessment   Medical Diagnosis LE weakness, gait instability and back pain     Referring Provider (PT) Cherly Beach     Onset Date/Surgical Date 10/27/18                         [facility] Adult PT Treatment/Exercise - 02/04/20 0001      Knee/Hip Exercises: Aerobic   Nustep 5 minutes - resistance level 4 - cues to push with legs      Knee/Hip Exercises: Standing   Forward Step Up 3 sets;5 reps;Hand Hold: 2;Step Height: 2";Both      Knee/Hip Exercises: Seated   Abduction/Adduction  AROM;Strengthening;Both;3 sets;10 reps   belt, abd only 5"holds    Sit to [employer] with UE support;20 reps   on thighs, blue cushion on chair- verbal cues                    PT Short Term Goals - 01/21/20 1708      PT SHORT TERM GOAL #1   Title Pt to be I in her HEP to improve her functioning level.    Time 4    Period Weeks    Status New    Target Date 02/19/20      PT SHORT TERM GOAL  #2   Title PT core and LE strength increased one grade to allow pt to be able to come sit to stand from a chair without the use of her UE>    Time 4    Period Weeks    Status New      PT SHORT TERM GOAL #3  Title PT to be able to single leg stance on both LE for at least 20 seconds to allow her to feel confident walking in her home with a cane.    Time 4    Period Weeks             PT Long Term Goals - 01/21/20 1709      PT LONG TERM GOAL #1   Title PT to be completing an advanced HEP to allow pt functional ability to increase.    Time 8    Period Weeks    Status New    Target Date 03/18/20      PT LONG TERM GOAL #2   Title PT core and LE strength to be at least a 4+ to allow pt to squat down and pick items off the floor for housework/yardwork and to go up and down 8 steps    Time 8    Period Weeks    Status New      PT LONG TERM GOAL #3   Title Pt to be able to single leg stance for at least 30 seconds to allow pt to feel confident walking in her home without an assistive device.    Time 8    Period Weeks    Status New    Target Date 03/18/20                 Plan - 02/04/20 1040    Clinical Impression Statement Pain continued to limit ability to participate in todays session. Allowed for longer rest breaks and modified exercises as able. No increase in pain with nutstep but fatigue noted. Will continue to strength as tolerated by patient.    Personal Factors and Comorbidities Past/Current Experience;Fitness;Time since onset of injury/illness/exacerbation    Examination-Activity Limitations Carry;Dressing;Lift;Locomotion Level;Stairs;Squat    Examination-Participation Restrictions Cleaning;Community Activity;Laundry;Shop    Stability/Clinical Decision Making Stable/Uncomplicated    Rehab Potential Good    PT Frequency 2x / week    PT Duration 8 weeks    PT Treatment/Interventions Therapeutic activities;Therapeutic exercise;Balance training;Neuromuscular  re-education;Stair training;Functional mobility training;Gait training;Patient/family education;Manual techniques    PT Next Visit Plan lunges, step ups, SLS, tandem stance, side steps, LE strengthengng    PT Home Exercise Plan heel raises, sit to stand, squats and bridges; 7/20 LAQs    Consulted and Agree with Plan of Care Patient           Patient will benefit from skilled therapeutic intervention in order to improve the following deficits and impairments:  Abnormal gait, Decreased activity tolerance, Decreased balance, Difficulty walking, Decreased strength, Pain  Visit Diagnosis: Muscle weakness (generalized)  Repeated falls  Difficulty in walking, not elsewhere classified  Chronic midline low back pain without sciatica     Problem List Patient Active Problem List   Diagnosis Date Noted  . Encounter for gynecological examination with Papanicolaou smear of cervix 01/14/2020  . Encounter for screening fecal occult blood testing 01/14/2020  . Chronic diarrhea of unknown origin 01/14/2020  . Encounter for screening for malignant neoplasm of cervix 12/29/2019  . 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 Hypothyroidism 12/29/2019  . Encounter for screening mammogram for malignant neoplasm of breast 12/29/2019  . Acute midline thoracic back pain 12/29/2019  . Loose stools   . Weakness   . Debility 09/10/2019  . ESRD (end stage renal disease) on dialysis ([place]) 09/10/2019  . Depression  09/10/2019  . Incontinence in female 09/10/2019  . Anemia in chronic kidney disease 08/19/2019  . Accelerated hypertension 08/08/2019  . MGUS (monoclonal gammopathy of unknown significance) 07/23/2019  . GERD (gastroesophageal reflux disease) 02/28/2019  . Hypertension associated with diabetes ([place]) 10/28/2018  . HYPERCHOLESTEROLEMIA 11/19/2008  . Type 2 diabetes with nephropathy ([place]) 07/02/2007   11:25  AM, 02/04/20 Jerene Pitch, DPT Physical Therapy with [facility]  [phone] office  [place] [address] [place], [place], [address] Phone: [phone]   Fax:  [phone]  Name: CEARA WRIGHTSON MRN: [id number] Date of Birth: 1970/08/02

## 2020-02-04 NOTE — Telephone Encounter (Signed)
Patient called and she would like to know if she should continue using her oxygen or discontinue it. Please call to discuss

## 2020-02-09 ENCOUNTER — Ambulatory Visit (HOSPITAL_COMMUNITY): Payer: Medicaid Other | Attending: Family Medicine | Admitting: Physical Therapy

## 2020-02-09 ENCOUNTER — Other Ambulatory Visit: Payer: Self-pay

## 2020-02-09 ENCOUNTER — Encounter (HOSPITAL_COMMUNITY): Payer: Self-pay | Admitting: Physical Therapy

## 2020-02-09 DIAGNOSIS — R262 Difficulty in walking, not elsewhere classified: Secondary | ICD-10-CM | POA: Diagnosis not present

## 2020-02-09 DIAGNOSIS — G8929 Other chronic pain: Secondary | ICD-10-CM | POA: Diagnosis not present

## 2020-02-09 DIAGNOSIS — M6281 Muscle weakness (generalized): Secondary | ICD-10-CM | POA: Insufficient documentation

## 2020-02-09 DIAGNOSIS — R296 Repeated falls: Secondary | ICD-10-CM

## 2020-02-09 DIAGNOSIS — M545 Low back pain, unspecified: Secondary | ICD-10-CM

## 2020-02-09 NOTE — Telephone Encounter (Signed)
I called and left patient a message to call back. 

## 2020-02-09 NOTE — Therapy (Signed)
[facility] [place], [place], [address] Phone: [phone]   Fax:  [phone]  Physical Therapy Treatment  Patient Details  Name: Melanie Hall MRN: [id number] Date of Birth: Jul 23, 1970 Referring Provider (PT): Cherly Beach    Encounter Date: 02/09/2020   PT End of Session - 02/09/20 1303    Visit Number 5    Number of Visits 16    Date for PT Re-Evaluation 03/17/20    Authorization Type [employer]- approved 24 visits 01/22/20 to 03/17/20    Authorization - Visit Number 5    Authorization - Number of Visits 24    Progress Note Due on Visit 10    PT Start Time 1257    PT Stop Time 1337    PT Time Calculation (min) 40 min    Activity Tolerance Patient tolerated treatment well    Behavior During Therapy [facility] for tasks assessed/performed           Past Medical History:  Diagnosis Date  . Acute cystitis without hematuria   . Acute on chronic renal failure ([place]) 02/28/2019  . Acute systolic heart failure ([place]) 10/21/2019  . Anemia   . Cataracts, bilateral    surgery to remove  . CATARACTS, BILATERAL 07/02/2007   Qualifier: Diagnosis of  By: Isla Pence    . Closed fracture of left femur ([place]) 10/28/2018  . Closed fracture of right ankle 11/06/2017  . Diabetes mellitus    type 2  . Emphysematous cystitis 08/26/2018  . ESRD on hemodialysis ([place])   . GERD (gastroesophageal reflux disease)   . Hyperlipidemia   . Hypertension   . IRREGULAR MENSES 09/14/2009   Qualifier: Diagnosis of  By: Hassell Done FNP, Tori Milks    . Normocytic anemia 08/26/2018  . PARONYCHIA, RIGHT GREAT TOE 07/30/2008   Qualifier: Diagnosis of  By: Hassell Done FNP, Tori Milks    . Pressure ulcer 09/10/2019  . Right arm weakness 08/08/2019  . Sprain of left ankle   . STEMI (ST elevation myocardial infarction) ([place]) 10/18/2019  . STEMI involving right coronary artery ([place]) 10/18/2019  . Stroke ([place])   . SVD (spontaneous vaginal delivery)    x 4  . Vaginosis  08/26/2018  . Weakness of both lower extremities     Past Surgical History:  Procedure Laterality Date  . A/V FISTULAGRAM Left 01/12/2020   Procedure: A/V FISTULAGRAM;  Surgeon: Serafina Mitchell, MD;  Location: [facility];  Service: Cardiovascular;  Laterality: Left;  . AV FISTULA PLACEMENT Left 08/17/2019   Procedure: LEFT BRACHIAL CEPHALIC ARTERIOVENOUS (AV) FISTULA;  Surgeon: Angelia Mould, MD;  Location: [facility];  Service: Vascular;  Laterality: Left;  . CORONARY STENT INTERVENTION N/A 10/18/2019   Procedure: CORONARY STENT INTERVENTION;  Surgeon: Sherren Mocha, MD;  Location: [facility];  Service: Cardiovascular;  Laterality: N/A;  . CORONARY/GRAFT ACUTE MI REVASCULARIZATION N/A 10/18/2019   Procedure: Coronary/Graft Acute MI Revascularization;  Surgeon: Sherren Mocha, MD;  Location: [facility];  Service: Cardiovascular;  Laterality: N/A;  . EYE SURGERY Bilateral    cataracts removed  . FEMUR IM NAIL Left 10/28/2018   Procedure: RETROGRADE FEMORAL NAILING;  Surgeon: Meredith Pel, MD;  Location: [facility];  Service: Orthopedics;  Laterality: Left;  . IM NAILING FEMORAL SHAFT RETROGRADE Left 10/28/2018  . INTRAVASCULAR ULTRASOUND/IVUS N/A 10/18/2019   Procedure: Intravascular Ultrasound/IVUS;  Surgeon: Sherren Mocha, MD;  Location: [facility];  Service: Cardiovascular;  Laterality: N/A;  .  IR FLUORO GUIDE CV LINE RIGHT  08/11/2019  . IR THORACENTESIS ASP PLEURAL SPACE W/IMG GUIDE  11/12/2019  . IR US GUIDE VASC ACCESS RIGHT  08/11/2019  . KNEE SURGERY Left   . LEFT HEART CATH AND CORONARY ANGIOGRAPHY N/A 10/18/2019   Procedure: LEFT HEART CATH AND CORONARY ANGIOGRAPHY;  Surgeon: Sherren Mocha, MD;  Location: [facility];  Service: Cardiovascular;  Laterality: N/A;  . PERIPHERAL VASCULAR BALLOON ANGIOPLASTY Left 01/12/2020   Procedure: PERIPHERAL VASCULAR BALLOON ANGIOPLASTY;  Surgeon: Serafina Mitchell, MD;  Location: [facility];  Service:  Cardiovascular;  Laterality: Left;  AVF  . RADIOLOGY WITH ANESTHESIA N/A 09/15/2019   Procedure: [facility] AND LUMBER LOWER BACK PAIN;  Surgeon: Radiologist, Medication, MD;  Location: [facility];  Service: Radiology;  Laterality: N/A;  . TUBAL LIGATION      There were no vitals filed for this visit.   Subjective Assessment - 02/09/20 1259    Subjective Patient reported that her back is very painful and rates it as a "20/10".    Pertinent History HTN,DM, CVA, femoral nailing 10/2018, LE weakness on dialysis (MWF)    Limitations Lifting;Standing;Walking;House hold activities    How long can you sit comfortably? no problem    How long can you walk comfortably? 3 hours using a walker or cart.    Patient Stated Goals to be off her walker    Currently in Pain? Yes    Pain Score 10-Worst pain ever    Pain Location Back    Pain Orientation Lower    Pain Descriptors / Indicators Aching    Pain Type Chronic pain    Pain Onset More than a month ago                             [facility] Adult PT Treatment/Exercise - 02/09/20 0001      Knee/Hip Exercises: Stretches   Other Knee/Hip Stretches Lumbar gentle stretch with physioball forward side and side 1x10'' each      Knee/Hip Exercises: Standing   Other Standing Knee Exercises Sidestepping in bars x 2 RT required rest break x then 3 RT      Knee/Hip Exercises: Seated   Long Arc Quad AROM;Strengthening;Right;Left;10 reps    Long Arc Quad Limitations Partial ROM LT due to pain    Other Seated Knee/Hip Exercises Ab set with march 3'' x 10 alternating LEs    Abduction/Adduction  2 sets;15 reps   Hip abduction isometric with belt   Sit to Sand with UE support;2 sets;10 reps   Blue cushion on chair                   PT Short Term Goals - 01/21/20 1708      PT SHORT TERM GOAL #1   Title Pt to be I in her HEP to improve her functioning level.    Time 4    Period Weeks    Status New    Target Date 02/19/20      PT SHORT  TERM GOAL #2   Title PT core and LE strength increased one grade to allow pt to be able to come sit to stand from a chair without the use of her UE>    Time 4    Period Weeks    Status New      PT SHORT TERM GOAL #3   Title PT to be able to single  leg stance on both LE for at least 20 seconds to allow her to feel confident walking in her home with a cane.    Time 4    Period Weeks             PT Long Term Goals - 01/21/20 1709      PT LONG TERM GOAL #1   Title PT to be completing an advanced HEP to allow pt functional ability to increase.    Time 8    Period Weeks    Status New    Target Date 03/18/20      PT LONG TERM GOAL #2   Title PT core and LE strength to be at least a 4+ to allow pt to squat down and pick items off the floor for housework/yardwork and to go up and down 8 steps    Time 8    Period Weeks    Status New      PT LONG TERM GOAL #3   Title Pt to be able to single leg stance for at least 30 seconds to allow pt to feel confident walking in her home without an assistive device.    Time 8    Period Weeks    Status New    Target Date 03/18/20                 Plan - 02/09/20 1346    Clinical Impression Statement Patient with significant back pain this session therefore modified due to patient's pain throughout session. Attempted lumbar stretch for pain modulation, but patient reported pain with this even with limited partial ROM. Added seated abdominal set with marching. Patient demonstrated trunk compensations with this despite cueing. Patient did require increased rest breaks this session due to pain.    Personal Factors and Comorbidities Past/Current Experience;Fitness;Time since onset of injury/illness/exacerbation    Examination-Activity Limitations Carry;Dressing;Lift;Locomotion Level;Stairs;Squat    Examination-Participation Restrictions Cleaning;Community Activity;Laundry;Shop    Stability/Clinical Decision Making Stable/Uncomplicated    Rehab  Potential Good    PT Frequency 2x / week    PT Duration 8 weeks    PT Treatment/Interventions Therapeutic activities;Therapeutic exercise;Balance training;Neuromuscular re-education;Stair training;Functional mobility training;Gait training;Patient/family education;Manual techniques    PT Next Visit Plan F/U on back pain, perform core stabilization exercises. lunges, step ups, SLS, tandem stance, side steps, LE strengthengng    PT Home Exercise Plan heel raises, sit to stand, squats and bridges; 7/20 LAQs    Consulted and Agree with Plan of Care Patient           Patient will benefit from skilled therapeutic intervention in order to improve the following deficits and impairments:  Abnormal gait, Decreased activity tolerance, Decreased balance, Difficulty walking, Decreased strength, Pain  Visit Diagnosis: Muscle weakness (generalized)  Repeated falls  Difficulty in walking, not elsewhere classified  Chronic midline low back pain without sciatica     Problem List Patient Active Problem List   Diagnosis Date Noted  . Encounter for gynecological examination with Papanicolaou smear of cervix 01/14/2020  . Encounter for screening fecal occult blood testing 01/14/2020  . Chronic diarrhea of unknown origin 01/14/2020  . Encounter for screening for malignant neoplasm of cervix 12/29/2019  . Type 2 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, with long-term current use of insulin ([place]) 12/29/2019  . Hypothyroidism 12/29/2019  . Encounter for screening mammogram for malignant neoplasm of breast 12/29/2019  . Acute midline thoracic back pain 12/29/2019  . Loose stools   .  Weakness   . Debility 09/10/2019  . ESRD (end stage renal disease) on dialysis ([place]) 09/10/2019  . Depression 09/10/2019  . Incontinence in female 09/10/2019  . Anemia in chronic kidney disease 08/19/2019  . Accelerated hypertension 08/08/2019  . MGUS (monoclonal gammopathy of unknown  significance) 07/23/2019  . GERD (gastroesophageal reflux disease) 02/28/2019  . Hypertension associated with diabetes ([place]) 10/28/2018  . HYPERCHOLESTEROLEMIA 11/19/2008  . Type 2 diabetes with nephropathy ([place]) 07/02/2007    Clarene Critchley PT, DPT 1:48 PM, 02/09/20 [facility] [address] [place], [place], [address] Phone: [phone]   Fax:  [phone]  Name: Melanie Hall MRN: [id number] Date of Birth: 1971/04/15

## 2020-02-11 ENCOUNTER — Ambulatory Visit (HOSPITAL_COMMUNITY): Payer: Medicaid Other | Admitting: Physical Therapy

## 2020-02-11 ENCOUNTER — Telehealth (HOSPITAL_COMMUNITY): Payer: Self-pay | Admitting: Physical Therapy

## 2020-02-11 ENCOUNTER — Ambulatory Visit (INDEPENDENT_AMBULATORY_CARE_PROVIDER_SITE_OTHER): Payer: Medicaid Other | Admitting: Gastroenterology

## 2020-02-11 NOTE — Telephone Encounter (Signed)
Patient notified

## 2020-02-11 NOTE — Telephone Encounter (Signed)
Follow up   Pt is returning call    

## 2020-02-11 NOTE — Telephone Encounter (Signed)
She can discontinue O2. Richardson Dopp, PA-C    02/11/2020 2:38 PM

## 2020-02-11 NOTE — Telephone Encounter (Signed)
I spoke with patient.  Oxygen was ordered at 11/03/19 office visit with Richardson Dopp, PA.  Patient had thoracentesis in May.  She reports currently she is not having any shortness of breath.  Does not check oxygen saturation at home.  Has not been using oxygen at home.  Did not see pulmonary as she was not able to arrange due to dialysis and other appointments.  She is willing to see pulmonary if needed. Will forward to [employer], [place] to see if patient should continue to have oxygen available as needed and if she should see pulmonary.

## 2020-02-11 NOTE — Telephone Encounter (Signed)
Pt reports that Transportation has a lot going on and the driver is not there to pick her up . She will see [place] next week

## 2020-02-16 ENCOUNTER — Other Ambulatory Visit: Payer: Self-pay

## 2020-02-16 ENCOUNTER — Telehealth (HOSPITAL_COMMUNITY): Payer: Self-pay | Admitting: Physical Therapy

## 2020-02-16 ENCOUNTER — Ambulatory Visit (HOSPITAL_COMMUNITY): Payer: Medicaid Other | Admitting: Physical Therapy

## 2020-02-16 DIAGNOSIS — M6281 Muscle weakness (generalized): Secondary | ICD-10-CM | POA: Diagnosis not present

## 2020-02-16 DIAGNOSIS — G8929 Other chronic pain: Secondary | ICD-10-CM | POA: Diagnosis not present

## 2020-02-16 DIAGNOSIS — M545 Low back pain: Secondary | ICD-10-CM | POA: Diagnosis not present

## 2020-02-16 DIAGNOSIS — R262 Difficulty in walking, not elsewhere classified: Secondary | ICD-10-CM

## 2020-02-16 DIAGNOSIS — R296 Repeated falls: Secondary | ICD-10-CM | POA: Diagnosis not present

## 2020-02-16 NOTE — Therapy (Signed)
[place], [place], [address] Phone: [phone]   Fax:  [phone]  Physical Therapy Treatment  Patient Details  Name: Melanie Hall MRN: [id number] Date of Birth: Sep 17, 1970 Referring Provider (PT): Cherly Beach    Encounter Date: 02/16/2020   PT End of Session - 02/16/20 1455    Visit Number 6    Number of Visits 16    Date for PT Re-Evaluation 03/17/20    Authorization Type [employer]- approved 24 visits 01/22/20 to 03/17/20    Authorization - Visit Number 6    Authorization - Number of Visits 24    Progress Note Due on Visit 10    PT Start Time 4235    PT Stop Time 1400    PT Time Calculation (min) 47 min    Activity Tolerance Patient tolerated treatment well    Behavior During Therapy [facility] for tasks assessed/performed           Past Medical History:  Diagnosis Date  . Acute cystitis without hematuria   . Acute on chronic renal failure ([place]) 02/28/2019  . Acute systolic heart failure ([place]) 10/21/2019  . Anemia   . Cataracts, bilateral    surgery to remove  . CATARACTS, BILATERAL 07/02/2007   Qualifier: Diagnosis of  By: Isla Pence    . Closed fracture of left femur ([place]) 10/28/2018  . Closed fracture of right ankle 11/06/2017  . Diabetes mellitus    type 2  . Emphysematous cystitis 08/26/2018  . ESRD on hemodialysis ([place])   . GERD (gastroesophageal reflux disease)   . Hyperlipidemia   . Hypertension   . IRREGULAR MENSES 09/14/2009   Qualifier: Diagnosis of  By: Hassell Done FNP, Tori Milks    . Normocytic anemia 08/26/2018  . PARONYCHIA, RIGHT GREAT TOE 07/30/2008   Qualifier: Diagnosis of  By: Hassell Done FNP, Tori Milks    . Pressure ulcer 09/10/2019  . Right arm weakness 08/08/2019  . Sprain of left ankle   . STEMI (ST elevation myocardial infarction) ([place]) 10/18/2019  . STEMI involving right coronary artery ([place]) 10/18/2019  . Stroke ([place])   . SVD (spontaneous vaginal delivery)    x 4  . Vaginosis  08/26/2018  . Weakness of both lower extremities     Past Surgical History:  Procedure Laterality Date  . A/V FISTULAGRAM Left 01/12/2020   Procedure: A/V FISTULAGRAM;  Surgeon: Serafina Mitchell, MD;  Location: [facility];  Service: Cardiovascular;  Laterality: Left;  . AV FISTULA PLACEMENT Left 08/17/2019   Procedure: LEFT BRACHIAL CEPHALIC ARTERIOVENOUS (AV) FISTULA;  Surgeon: Angelia Mould, MD;  Location: [facility];  Service: Vascular;  Laterality: Left;  . CORONARY STENT INTERVENTION N/A 10/18/2019   Procedure: CORONARY STENT INTERVENTION;  Surgeon: Sherren Mocha, MD;  Location: [facility];  Service: Cardiovascular;  Laterality: N/A;  . CORONARY/GRAFT ACUTE MI REVASCULARIZATION N/A 10/18/2019   Procedure: Coronary/Graft Acute MI Revascularization;  Surgeon: Sherren Mocha, MD;  Location: [facility];  Service: Cardiovascular;  Laterality: N/A;  . EYE SURGERY Bilateral    cataracts removed  . FEMUR IM NAIL Left 10/28/2018   Procedure: RETROGRADE FEMORAL NAILING;  Surgeon: Meredith Pel, MD;  Location: [facility];  Service: Orthopedics;  Laterality: Left;  . IM NAILING FEMORAL SHAFT RETROGRADE Left 10/28/2018  . INTRAVASCULAR ULTRASOUND/IVUS N/A 10/18/2019   Procedure: Intravascular Ultrasound/IVUS;  Surgeon: Sherren Mocha, MD;  Location: [facility];  Service: Cardiovascular;  Laterality: N/A;  .  IR FLUORO GUIDE CV LINE RIGHT  08/11/2019  . IR THORACENTESIS ASP PLEURAL SPACE W/IMG GUIDE  11/12/2019  . IR US GUIDE VASC ACCESS RIGHT  08/11/2019  . KNEE SURGERY Left   . LEFT HEART CATH AND CORONARY ANGIOGRAPHY N/A 10/18/2019   Procedure: LEFT HEART CATH AND CORONARY ANGIOGRAPHY;  Surgeon: Sherren Mocha, MD;  Location: [facility];  Service: Cardiovascular;  Laterality: N/A;  . PERIPHERAL VASCULAR BALLOON ANGIOPLASTY Left 01/12/2020   Procedure: PERIPHERAL VASCULAR BALLOON ANGIOPLASTY;  Surgeon: Serafina Mitchell, MD;  Location: [facility];  Service:  Cardiovascular;  Laterality: Left;  AVF  . RADIOLOGY WITH ANESTHESIA N/A 09/15/2019   Procedure: [place] AND LUMBER LOWER BACK PAIN;  Surgeon: Radiologist, Medication, MD;  Location: [facility];  Service: Radiology;  Laterality: N/A;  . TUBAL LIGATION      There were no vitals filed for this visit.   Subjective Assessment - 02/16/20 1313    Subjective pt states her back is better especially when she's trying to sleep at night however still rates pain high and denies need to go to ED 15/10.    Reports she's completing "some of em" as far as her HEP    Currently in Pain? Yes    Pain Score 10-Worst pain ever    Pain Location Back    Pain Orientation Lower    Pain Descriptors / Indicators Aching    Pain Type Chronic pain                             [facility] Adult PT Treatment/Exercise - 02/16/20 0001      Knee/Hip Exercises: Standing   Heel Raises 15 reps    Hip Flexion Both;2 sets;10 reps    Hip Abduction 2 sets;10 reps    Hip Extension 2 sets;Both;10 reps    Lateral Step Up Hand Hold: 1;Step Height: 4";10 reps    Forward Step Up 20 reps;Hand Hold: 2;Step Height: 4"      Knee/Hip Exercises: Seated   Long Arc Quad AROM;Strengthening;Right;Left;10 reps    Other Seated Knee/Hip Exercises seated dorsifllexion 15X                  PT Education - 02/16/20 1451    Education Details importance of completing HEP.  Added seated DF to HEP    Person(s) Educated Patient    Methods Explanation;Demonstration;Tactile cues;Verbal cues;Handout    Comprehension Verbalized understanding;Returned demonstration;Tactile cues required;Need further instruction;Verbal cues required            PT Short Term Goals - 01/21/20 1708      PT SHORT TERM GOAL #1   Title Pt to be I in her HEP to improve her functioning level.    Time 4    Period Weeks    Status New    Target Date 02/19/20      PT SHORT TERM GOAL #2   Title PT core and LE strength increased one grade to allow pt to be  able to come sit to stand from a chair without the use of her UE>    Time 4    Period Weeks    Status New      PT SHORT TERM GOAL #3   Title PT to be able to single leg stance on both LE for at least 20 seconds to allow her to feel confident walking in her home with a cane.  Time 4    Period Weeks             PT Long Term Goals - 01/21/20 1709      PT LONG TERM GOAL #1   Title PT to be completing an advanced HEP to allow pt functional ability to increase.    Time 8    Period Weeks    Status New    Target Date 03/18/20      PT LONG TERM GOAL #2   Title PT core and LE strength to be at least a 4+ to allow pt to squat down and pick items off the floor for housework/yardwork and to go up and down 8 steps    Time 8    Period Weeks    Status New      PT LONG TERM GOAL #3   Title Pt to be able to single leg stance for at least 30 seconds to allow pt to feel confident walking in her home without an assistive device.    Time 8    Period Weeks    Status New    Target Date 03/18/20                 Plan - 02/16/20 1452    Clinical Impression Statement Pt unstable when came into clinic using walker as she attempted to sanitize her UE's.  Able to self correct and regain her balance independently.  Progressed to standing exercises with ability to complete with cues for form and isolating correct musculature. Tends to hyperextend Lt knee with full WB (i.e when completing Rt leg lifts) with cues to control this as well.   Added seated DF as extremely weak and unable to complete in standing.  Pt able to complete lateral step ups using 1 UE today and close supervision of therapist.   Pt encouraged to complete therex more at home for maximum benefit.  Pt verbalized understanding.    Personal Factors and Comorbidities Past/Current Experience;Fitness;Time since onset of injury/illness/exacerbation    Examination-Activity Limitations Carry;Dressing;Lift;Locomotion Level;Stairs;Squat     Examination-Participation Restrictions Cleaning;Community Activity;Laundry;Shop    Stability/Clinical Decision Making Stable/Uncomplicated    Rehab Potential Good    PT Frequency 2x / week    PT Duration 8 weeks    PT Treatment/Interventions Therapeutic activities;Therapeutic exercise;Balance training;Neuromuscular re-education;Stair training;Functional mobility training;Gait training;Patient/family education;Manual techniques    PT Next Visit Plan Next session add lunges, SLS, tandem stance, side steps, LE strengthengng.  PRogress to reduce UE assistance with all standing exercises.    PT Home Exercise Plan heel raises, sit to stand, squats and bridges; 7/20 LAQs    Consulted and Agree with Plan of Care Patient           Patient will benefit from skilled therapeutic intervention in order to improve the following deficits and impairments:  Abnormal gait, Decreased activity tolerance, Decreased balance, Difficulty walking, Decreased strength, Pain  Visit Diagnosis: Difficulty in walking, not elsewhere classified  Chronic midline low back pain without sciatica  Repeated falls  Muscle weakness (generalized)     Problem List Patient Active Problem List   Diagnosis Date Noted  . Encounter for gynecological examination with Papanicolaou smear of cervix 01/14/2020  . Encounter for screening fecal occult blood testing 01/14/2020  . Chronic diarrhea of unknown origin 01/14/2020  . Encounter for screening for malignant neoplasm of cervix 12/29/2019  . Type 2 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, with long-term current use of insulin ([place])  12/29/2019  . Hypothyroidism 12/29/2019  . Encounter for screening mammogram for malignant neoplasm of breast 12/29/2019  . Acute midline thoracic back pain 12/29/2019  . Loose stools   . Weakness   . Debility 09/10/2019  . ESRD (end stage renal disease) on dialysis ([place]) 09/10/2019  . Depression 09/10/2019  .  Incontinence in female 09/10/2019  . Anemia in chronic kidney disease 08/19/2019  . Accelerated hypertension 08/08/2019  . MGUS (monoclonal gammopathy of unknown significance) 07/23/2019  . GERD (gastroesophageal reflux disease) 02/28/2019  . Hypertension associated with diabetes ([place]) 10/28/2018  . HYPERCHOLESTEROLEMIA 11/19/2008  . Type 2 diabetes with nephropathy ([place]) 07/02/2007   Teena Irani, PTA/CLT [phone]  Teena Irani 02/16/2020, 3:00 PM  [facility] [address] [place], [place], [address] Phone: [phone]   Fax:  [phone]  Name: Melanie Hall MRN: [id number] Date of Birth: September 23, 1970

## 2020-02-16 NOTE — Telephone Encounter (Signed)
Pt confirmed she will be here today and Thursday

## 2020-02-18 ENCOUNTER — Other Ambulatory Visit: Payer: Self-pay

## 2020-02-18 ENCOUNTER — Ambulatory Visit (HOSPITAL_COMMUNITY): Payer: Medicaid Other | Admitting: Physical Therapy

## 2020-02-18 ENCOUNTER — Other Ambulatory Visit: Payer: Self-pay | Admitting: *Deleted

## 2020-02-18 ENCOUNTER — Encounter: Payer: Self-pay | Admitting: Family Medicine

## 2020-02-18 ENCOUNTER — Telehealth (INDEPENDENT_AMBULATORY_CARE_PROVIDER_SITE_OTHER): Payer: Medicaid Other | Admitting: Family Medicine

## 2020-02-18 VITALS — BP 110/61 | Ht 62.2 in | Wt 140.0 lb

## 2020-02-18 DIAGNOSIS — R29898 Other symptoms and signs involving the musculoskeletal system: Secondary | ICD-10-CM

## 2020-02-18 DIAGNOSIS — R262 Difficulty in walking, not elsewhere classified: Secondary | ICD-10-CM

## 2020-02-18 DIAGNOSIS — G8929 Other chronic pain: Secondary | ICD-10-CM

## 2020-02-18 DIAGNOSIS — E039 Hypothyroidism, unspecified: Secondary | ICD-10-CM

## 2020-02-18 DIAGNOSIS — M6281 Muscle weakness (generalized): Secondary | ICD-10-CM | POA: Diagnosis not present

## 2020-02-18 DIAGNOSIS — R5381 Other malaise: Secondary | ICD-10-CM

## 2020-02-18 DIAGNOSIS — F32A Depression, unspecified: Secondary | ICD-10-CM

## 2020-02-18 DIAGNOSIS — R296 Repeated falls: Secondary | ICD-10-CM | POA: Diagnosis not present

## 2020-02-18 DIAGNOSIS — E1121 Type 2 diabetes mellitus with diabetic nephropathy: Secondary | ICD-10-CM | POA: Diagnosis not present

## 2020-02-18 DIAGNOSIS — M545 Low back pain: Secondary | ICD-10-CM | POA: Diagnosis not present

## 2020-02-18 DIAGNOSIS — E1122 Type 2 diabetes mellitus with diabetic chronic kidney disease: Secondary | ICD-10-CM

## 2020-02-18 DIAGNOSIS — F329 Major depressive disorder, single episode, unspecified: Secondary | ICD-10-CM

## 2020-02-18 MED ORDER — INSULIN ASPART PROT & ASPART (70-30 MIX) 100 UNIT/ML ~~LOC~~ SUSP: 10.0000 [IU] | Freq: Two times a day (BID) | SUBCUTANEOUS | 3 refills | Status: DC

## 2020-02-18 MED ORDER — LEVOTHYROXINE SODIUM 25 MCG PO TABS: 25.0000 ug | ORAL_TABLET | Freq: Every day | ORAL | 1 refills | Status: DC

## 2020-02-18 MED ORDER — TAMSULOSIN HCL 0.4 MG PO CAPS: 0.4000 mg | ORAL_CAPSULE | Freq: Every day | ORAL | 0 refills | Status: AC

## 2020-02-18 MED ORDER — SERTRALINE HCL 25 MG PO TABS: 25.0000 mg | ORAL_TABLET | Freq: Every day | ORAL | 0 refills | Status: DC

## 2020-02-18 MED ORDER — GABAPENTIN 300 MG PO CAPS: 300.0000 mg | ORAL_CAPSULE | Freq: Every day | ORAL | 0 refills | Status: DC

## 2020-02-18 NOTE — Therapy (Signed)
[place], [place], [address] Phone: [phone]   Fax:  [phone]  Physical Therapy Treatment  Patient Details  Name: Melanie Hall MRN: [id number] Date of Birth: [birth year] Referring Provider (PT): Cherly Beach    Encounter Date: 02/18/2020   PT End of Session - 02/18/20 1415    Visit Number 7    Number of Visits 16    Date for PT Re-Evaluation 03/17/20    Authorization Type [employer]- approved 24 visits 01/22/20 to 03/17/20    Authorization - Visit Number 7    Authorization - Number of Visits 24    Progress Note Due on Visit 10    PT Start Time 1318    PT Stop Time 1400    PT Time Calculation (min) 42 min    Activity Tolerance Patient tolerated treatment well    Behavior During Therapy [facility] for tasks assessed/performed           Past Medical History:  Diagnosis Date  . Acute cystitis without hematuria   . Acute kidney failure ([place]) 02/28/2019  . Acute midline thoracic back pain 12/29/2019  . Acute on chronic renal failure ([place]) 02/28/2019  . Acute systolic heart failure ([place]) 10/21/2019  . Anemia   . Cataracts, bilateral    surgery to remove  . CATARACTS, BILATERAL 07/02/2007   Qualifier: Diagnosis of  By: Isla Pence    . Closed fracture of left femur ([place]) 10/28/2018  . Closed fracture of right ankle 11/06/2017  . Diabetes mellitus    type 2  . Emphysematous cystitis 08/26/2018  . Encounter for gynecological examination with Papanicolaou smear of cervix 01/14/2020  . Encounter for screening fecal occult blood testing 01/14/2020  . Encounter for screening for malignant neoplasm of cervix 12/29/2019  . Encounter for screening mammogram for malignant neoplasm of breast 12/29/2019  . ESRD on hemodialysis ([place])   . GERD (gastroesophageal reflux disease)   . Hyperlipidemia   . Hypertension   . Hypokalemia 08/26/2018  . IRREGULAR MENSES 09/14/2009   Qualifier: Diagnosis of  By: Hassell Done FNP, Tori Milks    .  Loose stools 11/12/2019  . Normocytic anemia 08/26/2018  . PARONYCHIA, RIGHT GREAT TOE 07/30/2008   Qualifier: Diagnosis of  By: Hassell Done FNP, Tori Milks    . Pressure ulcer 09/10/2019  . Right arm weakness 08/08/2019  . Sprain of left ankle   . STEMI (ST elevation myocardial infarction) ([place]) 10/18/2019  . STEMI involving right coronary artery ([place]) 10/18/2019  . Stroke ([place])   . SVD (spontaneous vaginal delivery)    x 4  . Vaginosis 08/26/2018  . Weakness 09/10/2019  . Weakness of both lower extremities     Past Surgical History:  Procedure Laterality Date  . A/V FISTULAGRAM Left 01/12/2020   Procedure: A/V FISTULAGRAM;  Surgeon: Serafina Mitchell, MD;  Location: [facility];  Service: Cardiovascular;  Laterality: Left;  . AV FISTULA PLACEMENT Left 08/17/2019   Procedure: LEFT BRACHIAL CEPHALIC ARTERIOVENOUS (AV) FISTULA;  Surgeon: Angelia Mould, MD;  Location: [facility];  Service: Vascular;  Laterality: Left;  . CORONARY STENT INTERVENTION N/A 10/18/2019   Procedure: CORONARY STENT INTERVENTION;  Surgeon: Sherren Mocha, MD;  Location: [facility];  Service: Cardiovascular;  Laterality: N/A;  . CORONARY/GRAFT ACUTE MI REVASCULARIZATION N/A 10/18/2019   Procedure: Coronary/Graft Acute MI Revascularization;  Surgeon: Sherren Mocha, MD;  Location: [facility];  Service: Cardiovascular;  Laterality: N/A;  . EYE SURGERY  Bilateral    cataracts removed  . FEMUR IM NAIL Left 10/28/2018   Procedure: RETROGRADE FEMORAL NAILING;  Surgeon: Meredith Pel, MD;  Location: [facility];  Service: Orthopedics;  Laterality: Left;  . IM NAILING FEMORAL SHAFT RETROGRADE Left 10/28/2018  . INTRAVASCULAR ULTRASOUND/IVUS N/A 10/18/2019   Procedure: Intravascular Ultrasound/IVUS;  Surgeon: Sherren Mocha, MD;  Location: [facility];  Service: Cardiovascular;  Laterality: N/A;  . IR FLUORO GUIDE CV LINE RIGHT  08/11/2019  . IR THORACENTESIS ASP PLEURAL SPACE W/IMG GUIDE  11/12/2019  . IR US  GUIDE VASC ACCESS RIGHT  08/11/2019  . KNEE SURGERY Left   . LEFT HEART CATH AND CORONARY ANGIOGRAPHY N/A 10/18/2019   Procedure: LEFT HEART CATH AND CORONARY ANGIOGRAPHY;  Surgeon: Sherren Mocha, MD;  Location: [facility];  Service: Cardiovascular;  Laterality: N/A;  . PERIPHERAL VASCULAR BALLOON ANGIOPLASTY Left 01/12/2020   Procedure: PERIPHERAL VASCULAR BALLOON ANGIOPLASTY;  Surgeon: Serafina Mitchell, MD;  Location: [facility];  Service: Cardiovascular;  Laterality: Left;  AVF  . RADIOLOGY WITH ANESTHESIA N/A 09/15/2019   Procedure: [facility] AND LUMBER LOWER BACK PAIN;  Surgeon: Radiologist, Medication, MD;  Location: [place];  Service: Radiology;  Laterality: N/A;  . TUBAL LIGATION      There were no vitals filed for this visit.   Subjective Assessment - 02/18/20 1323    Subjective pt states her Lt LE is weak today from walking around in [place] [employer] this morning.   Reports her Lt LE is swollen today and has been for the past 3 weeks.  States she's been working on raising her toes up as well.    Currently in Pain? Yes                             [facility] Adult PT Treatment/Exercise - 02/18/20 0001      Knee/Hip Exercises: Standing   Heel Raises 15 reps    Hip Flexion Both;2 sets;10 reps    Hip Flexion Limitations did in seated today due to LE weakness    Hip Abduction 2 sets;10 reps    Hip Extension 2 sets;Both;10 reps      Knee/Hip Exercises: Seated   Long Arc Quad AROM;Strengthening;Right;Left;20 reps    Other Seated Knee/Hip Exercises seated dorsifllexion 15X                    PT Short Term Goals - 01/21/20 1708      PT SHORT TERM GOAL #1   Title Pt to be I in her HEP to improve her functioning level.    Time 4    Period Weeks    Status New    Target Date 02/19/20      PT SHORT TERM GOAL #2   Title PT core and LE strength increased one grade to allow pt to be able to come sit to stand from a chair without the use of her UE>    Time 4     Period Weeks    Status New      PT SHORT TERM GOAL #3   Title PT to be able to single leg stance on both LE for at least 20 seconds to allow her to feel confident walking in her home with a cane.    Time 4    Period Weeks             PT Long Term  Goals - 01/21/20 1709      PT LONG TERM GOAL #1   Title PT to be completing an advanced HEP to allow pt functional ability to increase.    Time 8    Period Weeks    Status New    Target Date 03/18/20      PT LONG TERM GOAL #2   Title PT core and LE strength to be at least a 4+ to allow pt to squat down and pick items off the floor for housework/yardwork and to go up and down 8 steps    Time 8    Period Weeks    Status New      PT LONG TERM GOAL #3   Title Pt to be able to single leg stance for at least 30 seconds to allow pt to feel confident walking in her home without an assistive device.    Time 8    Period Weeks    Status New    Target Date 03/18/20                 Plan - 02/18/20 1416    Clinical Impression Statement Pt with noted fatigue this session from earlier walking in [place] club.  Pt with increased difficulty completing standing exercises with short rest betwenn each and inability to keep upright posturing or Lt knee from buckling.  Recommended patient contact MD to discuss the increased edema in Lt LE.  Pt verbalized she would do this.  completed the last 15 mins of exercises seated as she was too fatigued to complete in good form in standing.    Personal Factors and Comorbidities Past/Current Experience;Fitness;Time since onset of injury/illness/exacerbation    Examination-Activity Limitations Carry;Dressing;Lift;Locomotion Level;Stairs;Squat    Examination-Participation Restrictions Cleaning;Community Activity;Laundry;Shop    Stability/Clinical Decision Making Stable/Uncomplicated    Rehab Potential Good    PT Frequency 2x / week    PT Duration 8 weeks    PT Treatment/Interventions Therapeutic  activities;Therapeutic exercise;Balance training;Neuromuscular re-education;Stair training;Functional mobility training;Gait training;Patient/family education;Manual techniques    PT Next Visit Plan Next session add lunges, SLS, tandem stance, side steps, LE strengthengng.  PRogress to reduce UE assistance with all standing exercises.  F/U  on Lt LE swelling and if she reached out to MD.    PT Home Exercise Plan heel raises, sit to stand, squats and bridges; 7/20 LAQs    Consulted and Agree with Plan of Care Patient           Patient will benefit from skilled therapeutic intervention in order to improve the following deficits and impairments:  Abnormal gait, Decreased activity tolerance, Decreased balance, Difficulty walking, Decreased strength, Pain  Visit Diagnosis: Chronic midline low back pain without sciatica  Difficulty in walking, not elsewhere classified  Repeated falls  Muscle weakness (generalized)     Problem List Patient Active Problem List   Diagnosis Date Noted  . Chronic diarrhea of unknown origin 01/14/2020  . Type 2 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, with long-term current use of insulin ([place]) 12/29/2019  . Hypothyroidism 12/29/2019  . Weakness of both lower extremities 11/12/2019  . Debility 09/10/2019  . ESRD (end stage renal disease) on dialysis ([place]) 09/10/2019  . Depression 09/10/2019  . Incontinence in female 09/10/2019  . Anemia in chronic kidney disease 08/19/2019  . Accelerated hypertension 08/08/2019  . MGUS (monoclonal gammopathy of unknown significance) 07/23/2019  . GERD (gastroesophageal reflux disease) 02/28/2019  . Essential hypertension 10/28/2018  . Irregular  menses 09/14/2009  . Hypercholesterolemia 11/19/2008  . Type 2 diabetes with nephropathy ([place]) 07/02/2007   Teena Irani, PTA/CLT [phone]  Teena Irani 02/18/2020, 2:18 PM  [place], [place], [address] Phone: [phone]   Fax:  [phone]  Name: Melanie Hall MRN: [id number] Date of Birth: 1971-05-25

## 2020-02-18 NOTE — Progress Notes (Signed)
Virtual Visit via Video Note   This visit type was conducted due to national recommendations for restrictions regarding the COVID-19 Pandemic (e.g. social distancing) in an effort to limit this patient's exposure and mitigate transmission in our community.  Due to her co-morbid illnesses, this patient is at least at moderate risk for complications without adequate follow up.  This format is felt to be most appropriate for this patient at this time.  All issues noted in this document were discussed and addressed.  A limited physical exam was performed with this format.      Evaluation Performed:  Follow-up visit  Date:  02/18/2020   ID:  Melanie Hall, DOB 1971-06-17, MRN [id number]  Patient Location: Home Provider Location: Office/Clinic  Location of Patient: Home Location of Provider: Telehealth Consent was obtain for visit to be over via telehealth. I verified that I am speaking with the correct person using two identifiers.  PCP:  Perlie Mayo, NP   Chief Complaint: [place] Rile needs  History of Present Illness:    Melanie Hall is a 49 y.o. female with with significant history that includes but is not limited to weakness in both legs, type 2 diabetes, end-stage renal disease, history of STEMI.  Reports that her walker is just not elevated and not her heart.  She is 5 foot 2 inches and her walker hits at her waist and causes her to hunch over more.  Physical therapy is working with her twice a week and she is trying to work around this however they recommended that she get a taller month so that she did not have to put so much strain on bending at her back.   The patient does not have symptoms concerning for COVID-19 infection (fever, chills, cough, or new shortness of breath).   Past Medical, Surgical, Social History, Allergies, and Medications have been Reviewed.  Past Medical History:  Diagnosis Date   Acute cystitis without hematuria    Acute on chronic renal failure ([place])  6/73/4193   Acute systolic heart failure ([place]) 10/21/2019   Anemia    Cataracts, bilateral    surgery to remove   CATARACTS, BILATERAL 07/02/2007   Qualifier: Diagnosis of  By: Isla Pence     Closed fracture of left femur ([place]) 10/28/2018   Closed fracture of right ankle 11/06/2017   Diabetes mellitus    type 2   Emphysematous cystitis 08/26/2018   ESRD on hemodialysis ([facility])    GERD (gastroesophageal reflux disease)    Hyperlipidemia    Hypertension    IRREGULAR MENSES 09/14/2009   Qualifier: Diagnosis of  By: Hassell Done FNP, [place]     Normocytic anemia 08/26/2018   PARONYCHIA, RIGHT GREAT TOE 07/30/2008   Qualifier: Diagnosis of  By: Hassell Done FNP, [place]     Pressure ulcer 09/10/2019   Right arm weakness 08/08/2019   Sprain of left ankle    STEMI (ST elevation myocardial infarction) ([place]) 10/18/2019   STEMI involving right coronary artery ([place]) 10/18/2019   Stroke ([place])    SVD (spontaneous vaginal delivery)    x 4   Vaginosis 08/26/2018   Weakness of both lower extremities    Past Surgical History:  Procedure Laterality Date   A/V FISTULAGRAM Left 01/12/2020   Procedure: A/V FISTULAGRAM;  Surgeon: Serafina Mitchell, MD;  Location: [facility];  Service: Cardiovascular;  Laterality: Left;   AV FISTULA PLACEMENT Left 08/17/2019   Procedure: LEFT BRACHIAL CEPHALIC ARTERIOVENOUS (AV) FISTULA;  Surgeon: Angelia Mould, MD;  Location: [facility];  Service: Vascular;  Laterality: Left;   CORONARY STENT INTERVENTION N/A 10/18/2019   Procedure: CORONARY STENT INTERVENTION;  Surgeon: Sherren Mocha, MD;  Location: [facility];  Service: Cardiovascular;  Laterality: N/A;   CORONARY/GRAFT ACUTE MI REVASCULARIZATION N/A 10/18/2019   Procedure: Coronary/Graft Acute MI Revascularization;  Surgeon: Sherren Mocha, MD;  Location: [facility];  Service: Cardiovascular;  Laterality: N/A;   EYE SURGERY Bilateral    cataracts removed   FEMUR IM NAIL Left  10/28/2018   Procedure: RETROGRADE FEMORAL NAILING;  Surgeon: Meredith Pel, MD;  Location: [facility];  Service: Orthopedics;  Laterality: Left;   IM NAILING FEMORAL SHAFT RETROGRADE Left 10/28/2018   INTRAVASCULAR ULTRASOUND/IVUS N/A 10/18/2019   Procedure: Intravascular Ultrasound/IVUS;  Surgeon: Sherren Mocha, MD;  Location: [facility];  Service: Cardiovascular;  Laterality: N/A;   IR FLUORO GUIDE CV LINE RIGHT  08/11/2019   IR THORACENTESIS ASP PLEURAL SPACE W/IMG GUIDE  11/12/2019   IR US GUIDE VASC ACCESS RIGHT  08/11/2019   KNEE SURGERY Left    LEFT HEART CATH AND CORONARY ANGIOGRAPHY N/A 10/18/2019   Procedure: LEFT HEART CATH AND CORONARY ANGIOGRAPHY;  Surgeon: Sherren Mocha, MD;  Location: [facility];  Service: Cardiovascular;  Laterality: N/A;   PERIPHERAL VASCULAR BALLOON ANGIOPLASTY Left 01/12/2020   Procedure: PERIPHERAL VASCULAR BALLOON ANGIOPLASTY;  Surgeon: Serafina Mitchell, MD;  Location: [facility];  Service: Cardiovascular;  Laterality: Left;  AVF   RADIOLOGY WITH ANESTHESIA N/A 09/15/2019   Procedure: [facility] AND LUMBER LOWER BACK PAIN;  Surgeon: Radiologist, Medication, MD;  Location: [facility];  Service: Radiology;  Laterality: N/A;   TUBAL LIGATION       Current Meds  Medication Sig   aspirin 81 MG EC tablet Take 1 tablet (81 mg total) by mouth daily.   atorvastatin (LIPITOR) 80 MG tablet Take 1 tablet (80 mg total) by mouth daily at 6 PM.   carvedilol (COREG) 6.25 MG tablet Take 1.5 tablets (9.375 mg total) by mouth 2 (two) times daily.   epoetin alfa (EPOGEN) 3000 UNIT/ML injection Inject 3,000 Units into the skin every 14 (fourteen) days.    hydrALAZINE (APRESOLINE) 10 MG tablet Take 1 tablet (10 mg total) by mouth in the morning and at bedtime.   isosorbide mononitrate (IMDUR) 30 MG 24 hr tablet Take 0.5 tablets (15 mg total) by mouth daily.   multivitamin (RENA-VIT) TABS tablet Take 1 tablet by mouth at bedtime.   nitroGLYCERIN  (NITROSTAT) 0.4 MG SL tablet Place 1 tablet (0.4 mg total) under the tongue every 5 (five) minutes x 3 doses as needed for chest pain.   tamsulosin (FLOMAX) 0.4 MG CAPS capsule Take 1 capsule (0.4 mg total) by mouth daily after supper.   ticagrelor (BRILINTA) 90 MG TABS tablet Take 1 tablet (90 mg total) by mouth 2 (two) times daily.   zolpidem (AMBIEN) 5 MG tablet Take 1 tablet (5 mg total) by mouth at bedtime as needed for sleep.   [DISCONTINUED] insulin aspart protamine- aspart (NOVOLOG MIX 70/30) (70-30) 100 UNIT/ML injection Inject 0.1 mLs (10 Units total) into the skin 2 (two) times daily with a meal.   [DISCONTINUED] levothyroxine (SYNTHROID) 25 MCG tablet Take 1 tablet (25 mcg total) by mouth daily before breakfast.     Allergies:   Penicillins   ROS:   Please see the history of present illness.    All other systems reviewed  and are negative.   Labs/Other Tests and Data Reviewed:    Recent Labs: 08/08/2019: B Natriuretic Peptide 279.0 08/24/2019: Magnesium 1.8 11/12/2019: Platelets 186 12/15/2019: ALT 26 12/29/2019: TSH 4.09 01/12/2020: BUN 14; Creatinine, Ser 3.50; Hemoglobin 12.2; Potassium 3.9; Sodium 137   Recent Lipid Panel Lab Results  Component Value Date/Time   CHOL 94 (L) 12/15/2019 09:41 AM   TRIG 94 12/15/2019 09:41 AM   HDL 35 (L) 12/15/2019 09:41 AM   CHOLHDL 2.7 12/15/2019 09:41 AM   CHOLHDL 3.4 10/19/2019 12:34 AM   LDLCALC 41 12/15/2019 09:41 AM    Wt Readings from Last 3 Encounters:  02/18/20 140 lb (63.5 kg)  01/14/20 140 lb 12.8 oz (63.9 kg)  01/12/20 136 lb 7.4 oz (61.9 kg)     Objective:    Vital Signs:  BP 110/61    Ht 5' 2.2" (1.58 m)    Wt 140 lb (63.5 kg)    LMP 12/17/2016    BMI 25.44 kg/m    VITAL SIGNS:  reviewed GEN:  no acute distress EYES:  sclerae anicteric, EOMI - Extraocular Movements Intact RESPIRATORY:  normal respiratory effort, symmetric expansion SKIN:  no rash, lesions or ulcers. MUSCULOSKELETAL:  no obvious  deformities. NEURO:  alert and oriented x 3, no obvious focal deficit PSYCH:  normal affect  ASSESSMENT & PLAN:    1. Type 2 diabetes with nephropathy (HCC) - gabapentin (NEURONTIN) 300 MG capsule; Take 1 capsule (300 mg total) by mouth at bedtime.  Dispense: 90 capsule; Refill: 0  2. Debility  3. Weakness of both lower extremities   Time:   Today, I have spent 10 minutes with the patient with telehealth technology discussing the above problems.     Medication Adjustments/Labs and Tests Ordered: Current medicines are reviewed at length with the patient today.  Concerns regarding medicines are outlined above.   Tests Ordered: No orders of the defined types were placed in this encounter.   Medication Changes: No orders of the defined types were placed in this encounter.   Disposition:  Follow up 3 months  Signed, Perlie Mayo, NP  02/18/2020 8:58 AM     [place]

## 2020-02-18 NOTE — Assessment & Plan Note (Signed)
She is working twice weekly with physical therapy.  Needs a taller walker as she is having to hunch over to use the one she has.  At highest level

## 2020-02-18 NOTE — Assessment & Plan Note (Signed)
Needs refill on her Neurontin.  Reports taking her medications as directed without issue.

## 2020-02-18 NOTE — Assessment & Plan Note (Signed)
Extreme difficulty with mobility.  Physical therapy is working with her as well as getting her a new walker that does not require her to be bent over as much.

## 2020-02-18 NOTE — Patient Instructions (Signed)
I appreciate the opportunity to provide you with care for your health and wellness. Today we discussed: Need for walker  Follow up: 3 months   No labs or referrals today Orders: Order for walker placed today  Refills on medications previously prescribed by your other primary care office provided today.  Please continue to practice social distancing to keep you, your family, and our community safe.  If you must go out, please wear a mask and practice good handwashing.  It was a pleasure to see you and I look forward to continuing to work together on your health and well-being. Please do not hesitate to call the office if you need care or have questions about your care.  Have a wonderful day and week. With Gratitude, Cherly Beach, DNP, AGNP-BC

## 2020-02-23 ENCOUNTER — Ambulatory Visit (HOSPITAL_COMMUNITY): Payer: Medicaid Other | Admitting: Physical Therapy

## 2020-02-23 ENCOUNTER — Other Ambulatory Visit: Payer: Self-pay

## 2020-02-23 DIAGNOSIS — R262 Difficulty in walking, not elsewhere classified: Secondary | ICD-10-CM

## 2020-02-23 DIAGNOSIS — G8929 Other chronic pain: Secondary | ICD-10-CM | POA: Diagnosis not present

## 2020-02-23 DIAGNOSIS — R296 Repeated falls: Secondary | ICD-10-CM

## 2020-02-23 DIAGNOSIS — M6281 Muscle weakness (generalized): Secondary | ICD-10-CM

## 2020-02-23 DIAGNOSIS — M545 Low back pain: Secondary | ICD-10-CM | POA: Diagnosis not present

## 2020-02-23 NOTE — Therapy (Signed)
[place], [place], [address] Phone: [phone]   Fax:  [phone]  Physical Therapy Treatment  Patient Details  Name: Melanie Hall MRN: [id number] Date of Birth: [birth year] Referring Provider (PT): Cherly Beach    Encounter Date: 02/23/2020   PT End of Session - 02/23/20 1341    Visit Number 8    Number of Visits 16    Date for PT Re-Evaluation 03/17/20    Authorization Type [employer]- approved 24 visits 01/22/20 to 03/17/20    Authorization - Visit Number 8    Authorization - Number of Visits 24    Progress Note Due on Visit 10    PT Start Time 1332    PT Stop Time 1400    PT Time Calculation (min) 28 min    Activity Tolerance Patient tolerated treatment well    Behavior During Therapy [facility] for tasks assessed/performed           Past Medical History:  Diagnosis Date  . Acute cystitis without hematuria   . Acute kidney failure ([place]) 02/28/2019  . Acute midline thoracic back pain 12/29/2019  . Acute on chronic renal failure ([place]) 02/28/2019  . Acute systolic heart failure ([place]) 10/21/2019  . Anemia   . Cataracts, bilateral    surgery to remove  . CATARACTS, BILATERAL 07/02/2007   Qualifier: Diagnosis of  By: Isla Pence    . Closed fracture of left femur ([place]) 10/28/2018  . Closed fracture of right ankle 11/06/2017  . Diabetes mellitus    type 2  . Emphysematous cystitis 08/26/2018  . Encounter for gynecological examination with Papanicolaou smear of cervix 01/14/2020  . Encounter for screening fecal occult blood testing 01/14/2020  . Encounter for screening for malignant neoplasm of cervix 12/29/2019  . Encounter for screening mammogram for malignant neoplasm of breast 12/29/2019  . ESRD on hemodialysis ([place])   . GERD (gastroesophageal reflux disease)   . Hyperlipidemia   . Hypertension   . Hypokalemia 08/26/2018  . IRREGULAR MENSES 09/14/2009   Qualifier: Diagnosis of  By: Hassell Done FNP, Tori Milks    .  Loose stools 11/12/2019  . Normocytic anemia 08/26/2018  . PARONYCHIA, RIGHT GREAT TOE 07/30/2008   Qualifier: Diagnosis of  By: Hassell Done FNP, Tori Milks    . Pressure ulcer 09/10/2019  . Right arm weakness 08/08/2019  . Sprain of left ankle   . STEMI (ST elevation myocardial infarction) ([place]) 10/18/2019  . STEMI involving right coronary artery ([place]) 10/18/2019  . Stroke ([place])   . SVD (spontaneous vaginal delivery)    x 4  . Vaginosis 08/26/2018  . Weakness 09/10/2019  . Weakness of both lower extremities     Past Surgical History:  Procedure Laterality Date  . A/V FISTULAGRAM Left 01/12/2020   Procedure: A/V FISTULAGRAM;  Surgeon: Serafina Mitchell, MD;  Location: [facility];  Service: Cardiovascular;  Laterality: Left;  . AV FISTULA PLACEMENT Left 08/17/2019   Procedure: LEFT BRACHIAL CEPHALIC ARTERIOVENOUS (AV) FISTULA;  Surgeon: Angelia Mould, MD;  Location: [facility];  Service: Vascular;  Laterality: Left;  . CORONARY STENT INTERVENTION N/A 10/18/2019   Procedure: CORONARY STENT INTERVENTION;  Surgeon: Sherren Mocha, MD;  Location: [facility];  Service: Cardiovascular;  Laterality: N/A;  . CORONARY/GRAFT ACUTE MI REVASCULARIZATION N/A 10/18/2019   Procedure: Coronary/Graft Acute MI Revascularization;  Surgeon: Sherren Mocha, MD;  Location: [facility];  Service: Cardiovascular;  Laterality: N/A;  . EYE SURGERY  Bilateral    cataracts removed  . FEMUR IM NAIL Left 10/28/2018   Procedure: RETROGRADE FEMORAL NAILING;  Surgeon: Meredith Pel, MD;  Location: [facility];  Service: Orthopedics;  Laterality: Left;  . IM NAILING FEMORAL SHAFT RETROGRADE Left 10/28/2018  . INTRAVASCULAR ULTRASOUND/IVUS N/A 10/18/2019   Procedure: Intravascular Ultrasound/IVUS;  Surgeon: Sherren Mocha, MD;  Location: [facility];  Service: Cardiovascular;  Laterality: N/A;  . IR FLUORO GUIDE CV LINE RIGHT  08/11/2019  . IR THORACENTESIS ASP PLEURAL SPACE W/IMG GUIDE  11/12/2019  . IR US  GUIDE VASC ACCESS RIGHT  08/11/2019  . KNEE SURGERY Left   . LEFT HEART CATH AND CORONARY ANGIOGRAPHY N/A 10/18/2019   Procedure: LEFT HEART CATH AND CORONARY ANGIOGRAPHY;  Surgeon: Sherren Mocha, MD;  Location: [facility];  Service: Cardiovascular;  Laterality: N/A;  . PERIPHERAL VASCULAR BALLOON ANGIOPLASTY Left 01/12/2020   Procedure: PERIPHERAL VASCULAR BALLOON ANGIOPLASTY;  Surgeon: Serafina Mitchell, MD;  Location: [facility];  Service: Cardiovascular;  Laterality: Left;  AVF  . RADIOLOGY WITH ANESTHESIA N/A 09/15/2019   Procedure: [facility] AND LUMBER LOWER BACK PAIN;  Surgeon: Radiologist, Medication, MD;  Location: [place] [facility];  Service: Radiology;  Laterality: N/A;  . TUBAL LIGATION      There were no vitals filed for this visit.   Subjective Assessment - 02/23/20 1334    Subjective pt in restroom from 1:18-1:32.  states she's been going through GI issues for over a year with diarrhea.   STates she has a GI appt on 9/16.    Currently in Pain? No/denies                             [facility] Adult PT Treatment/Exercise - 02/23/20 0001      Knee/Hip Exercises: Standing   Heel Raises 20 reps    Hip Flexion Both;2 sets;10 reps    Hip Flexion Limitations did in seated today due to LE weakness    Forward Lunges Both;2 sets;15 reps    Forward Lunges Limitations onto 4" step with bil UE asssit    Hip Abduction 2 sets;15 reps    Hip Extension 2 sets;Both;15 reps    Other Standing Knee Exercises tandem stance 30" each LE lead with intermittent HHA                    PT Short Term Goals - 01/21/20 1708      PT SHORT TERM GOAL #1   Title Pt to be I in her HEP to improve her functioning level.    Time 4    Period Weeks    Status New    Target Date 02/19/20      PT SHORT TERM GOAL #2   Title PT core and LE strength increased one grade to allow pt to be able to come sit to stand from a chair without the use of her UE>    Time 4    Period Weeks     Status New      PT SHORT TERM GOAL #3   Title PT to be able to single leg stance on both LE for at least 20 seconds to allow her to feel confident walking in her home with a cane.    Time 4    Period Weeks             PT Long Term Goals - 01/21/20 1709  PT LONG TERM GOAL #1   Title PT to be completing an advanced HEP to allow pt functional ability to increase.    Time 8    Period Weeks    Status New    Target Date 03/18/20      PT LONG TERM GOAL #2   Title PT core and LE strength to be at least a 4+ to allow pt to squat down and pick items off the floor for housework/yardwork and to go up and down 8 steps    Time 8    Period Weeks    Status New      PT LONG TERM GOAL #3   Title Pt to be able to single leg stance for at least 30 seconds to allow pt to feel confident walking in her home without an assistive device.    Time 8    Period Weeks    Status New    Target Date 03/18/20                 Plan - 02/23/20 1342    Clinical Impression Statement Unable to complete full session today due to patient being in restroom first 15 minutes of session.  Pt has been having ongoing intestinal issues but denies any viral or anything contagiuous.  Continued with LE strengthening exercises with less fatigue and rest breaks as needed last session.  ABle to increase most exercises to 2 sets of 15 this session.  Added forward lunges onto 4" step  and tandem stance.  max of 10" with each LE lead wihtout UE assist in 30" trial.  Overall improving with increasing LE strength.    Personal Factors and Comorbidities Past/Current Experience;Fitness;Time since onset of injury/illness/exacerbation    Examination-Activity Limitations Carry;Dressing;Lift;Locomotion Level;Stairs;Squat    Examination-Participation Restrictions Cleaning;Community Activity;Laundry;Shop    Stability/Clinical Decision Making Stable/Uncomplicated    Rehab Potential Good    PT Frequency 2x / week    PT Duration 8  weeks    PT Treatment/Interventions Therapeutic activities;Therapeutic exercise;Balance training;Neuromuscular re-education;Stair training;Functional mobility training;Gait training;Patient/family education;Manual techniques    PT Next Visit Plan Next session add SLS and continue LE strengthengng.  PRogress to reduce UE assistance with all standing exercises.  F/U  on Lt LE swelling and if she reached out to MD.    PT Home Exercise Plan heel raises, sit to stand, squats and bridges; 7/20 LAQs    Consulted and Agree with Plan of Care Patient           Patient will benefit from skilled therapeutic intervention in order to improve the following deficits and impairments:  Abnormal gait, Decreased activity tolerance, Decreased balance, Difficulty walking, Decreased strength, Pain  Visit Diagnosis: Difficulty in walking, not elsewhere classified  Repeated falls  Chronic midline low back pain without sciatica  Muscle weakness (generalized)     Problem List Patient Active Problem List   Diagnosis Date Noted  . Chronic diarrhea of unknown origin 01/14/2020  . Type 2 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, with long-term current use of insulin ([place]) 12/29/2019  . Hypothyroidism 12/29/2019  . Weakness of both lower extremities 11/12/2019  . Debility 09/10/2019  . ESRD (end stage renal disease) on dialysis ([place]) 09/10/2019  . Depression 09/10/2019  . Incontinence in female 09/10/2019  . Anemia in chronic kidney disease 08/19/2019  . Accelerated hypertension 08/08/2019  . MGUS (monoclonal gammopathy of unknown significance) 07/23/2019  . GERD (gastroesophageal reflux disease) 02/28/2019  . Essential hypertension  10/28/2018  . Irregular menses 09/14/2009  . Hypercholesterolemia 11/19/2008  . Type 2 diabetes with nephropathy ([place]) 07/02/2007   Teena Irani, PTA/CLT [phone]  Teena Irani 02/23/2020, 2:02 PM  [place], [place], [address] Phone: [phone]   Fax:  [phone]  Name: Melanie Hall MRN: [id number] Date of Birth: 02-09-1971

## 2020-02-25 ENCOUNTER — Other Ambulatory Visit: Payer: Self-pay

## 2020-02-25 ENCOUNTER — Ambulatory Visit (HOSPITAL_COMMUNITY): Payer: Medicaid Other | Admitting: Physical Therapy

## 2020-02-25 DIAGNOSIS — M545 Low back pain, unspecified: Secondary | ICD-10-CM

## 2020-02-25 DIAGNOSIS — R296 Repeated falls: Secondary | ICD-10-CM | POA: Diagnosis not present

## 2020-02-25 DIAGNOSIS — R262 Difficulty in walking, not elsewhere classified: Secondary | ICD-10-CM | POA: Diagnosis not present

## 2020-02-25 DIAGNOSIS — G8929 Other chronic pain: Secondary | ICD-10-CM | POA: Diagnosis not present

## 2020-02-25 DIAGNOSIS — M6281 Muscle weakness (generalized): Secondary | ICD-10-CM

## 2020-02-25 NOTE — Therapy (Signed)
[place], [place], [address] Phone: [phone]   Fax:  [phone]  Physical Therapy Treatment  Patient Details  Name: Melanie Hall MRN: [id number] Date of Birth: 1970-12-25 Referring Provider (PT): Cherly Beach    Encounter Date: 02/25/2020   PT End of Session - 02/25/20 1436    Visit Number 9    Number of Visits 16    Date for PT Re-Evaluation 03/17/20    Authorization Type [employer]- approved 24 visits 01/22/20 to 03/17/20    Authorization - Visit Number 9    Authorization - Number of Visits 24    Progress Note Due on Visit 10    PT Start Time 1320    PT Stop Time 1350    PT Time Calculation (min) 30 min    Activity Tolerance Patient tolerated treatment well    Behavior During Therapy [facility] for tasks assessed/performed           Past Medical History:  Diagnosis Date  . Acute cystitis without hematuria   . Acute kidney failure ([place]) 02/28/2019  . Acute midline thoracic back pain 12/29/2019  . Acute on chronic renal failure ([place]) 02/28/2019  . Acute systolic heart failure ([place]) 10/21/2019  . Anemia   . Cataracts, bilateral    surgery to remove  . CATARACTS, BILATERAL 07/02/2007   Qualifier: Diagnosis of  By: Isla Pence    . Closed fracture of left femur ([place]) 10/28/2018  . Closed fracture of right ankle 11/06/2017  . Diabetes mellitus    type 2  . Emphysematous cystitis 08/26/2018  . Encounter for gynecological examination with Papanicolaou smear of cervix 01/14/2020  . Encounter for screening fecal occult blood testing 01/14/2020  . Encounter for screening for malignant neoplasm of cervix 12/29/2019  . Encounter for screening mammogram for malignant neoplasm of breast 12/29/2019  . ESRD on hemodialysis ([place])   . GERD (gastroesophageal reflux disease)   . Hyperlipidemia   . Hypertension   . Hypokalemia 08/26/2018  . IRREGULAR MENSES 09/14/2009   Qualifier: Diagnosis of  By: Hassell Done FNP, Tori Milks    .  Loose stools 11/12/2019  . Normocytic anemia 08/26/2018  . PARONYCHIA, RIGHT GREAT TOE 07/30/2008   Qualifier: Diagnosis of  By: Hassell Done FNP, Tori Milks    . Pressure ulcer 09/10/2019  . Right arm weakness 08/08/2019  . Sprain of left ankle   . STEMI (ST elevation myocardial infarction) ([place]) 10/18/2019  . STEMI involving right coronary artery ([place]) 10/18/2019  . Stroke ([place])   . SVD (spontaneous vaginal delivery)    x 4  . Vaginosis 08/26/2018  . Weakness 09/10/2019  . Weakness of both lower extremities     Past Surgical History:  Procedure Laterality Date  . A/V FISTULAGRAM Left 01/12/2020   Procedure: A/V FISTULAGRAM;  Surgeon: Serafina Mitchell, MD;  Location: [facility];  Service: Cardiovascular;  Laterality: Left;  . AV FISTULA PLACEMENT Left 08/17/2019   Procedure: LEFT BRACHIAL CEPHALIC ARTERIOVENOUS (AV) FISTULA;  Surgeon: [place]ia Mould, MD;  Location: [facility];  Service: Vascular;  Laterality: Left;  . CORONARY STENT INTERVENTION N/A 10/18/2019   Procedure: CORONARY STENT INTERVENTION;  Surgeon: Sherren Mocha, MD;  Location: [facility];  Service: Cardiovascular;  Laterality: N/A;  . CORONARY/GRAFT ACUTE MI REVASCULARIZATION N/A 10/18/2019   Procedure: Coronary/Graft Acute MI Revascularization;  Surgeon: Sherren Mocha, MD;  Location: [facility];  Service: Cardiovascular;  Laterality: N/A;  . EYE SURGERY  Bilateral    cataracts removed  . FEMUR IM NAIL Left 10/28/2018   Procedure: RETROGRADE FEMORAL NAILING;  Surgeon: Meredith Pel, MD;  Location: [facility];  Service: Orthopedics;  Laterality: Left;  . IM NAILING FEMORAL SHAFT RETROGRADE Left 10/28/2018  . INTRAVASCULAR ULTRASOUND/IVUS N/A 10/18/2019   Procedure: Intravascular Ultrasound/IVUS;  Surgeon: Sherren Mocha, MD;  Location: [facility];  Service: Cardiovascular;  Laterality: N/A;  . IR FLUORO GUIDE CV LINE RIGHT  08/11/2019  . IR THORACENTESIS ASP PLEURAL SPACE W/IMG GUIDE  11/12/2019  . IR US  GUIDE VASC ACCESS RIGHT  08/11/2019  . KNEE SURGERY Left   . LEFT HEART CATH AND CORONARY ANGIOGRAPHY N/A 10/18/2019   Procedure: LEFT HEART CATH AND CORONARY ANGIOGRAPHY;  Surgeon: Sherren Mocha, MD;  Location: [facility];  Service: Cardiovascular;  Laterality: N/A;  . PERIPHERAL VASCULAR BALLOON ANGIOPLASTY Left 01/12/2020   Procedure: PERIPHERAL VASCULAR BALLOON ANGIOPLASTY;  Surgeon: Serafina Mitchell, MD;  Location: [facility];  Service: Cardiovascular;  Laterality: Left;  AVF  . RADIOLOGY WITH ANESTHESIA N/A 09/15/2019   Procedure: [facility] AND LUMBER LOWER BACK PAIN;  Surgeon: Radiologist, Medication, MD;  Location: [facility];  Service: Radiology;  Laterality: N/A;  . TUBAL LIGATION      There were no vitals filed for this visit.   Subjective Assessment - 02/25/20 1327    Subjective Pt states her pain is up today.  States it's a 12/10 LBP.  appears to be in distress as well.    Currently in Pain? Yes    Pain Score 10-Worst pain ever    Pain Location Back    Pain Orientation Mid;Lower    Pain Descriptors / Indicators Aching;Sore                             [facility] Adult PT Treatment/Exercise - 02/25/20 0001      Knee/Hip Exercises: Standing   Heel Raises 20 reps    Hip Abduction 10 reps    Hip Extension 10 reps      Knee/Hip Exercises: Supine   Other Supine Knee/Hip Exercises trial of stretches. LTR, KTC. both aggrevated LBP.                  PT Education - 02/25/20 1437    Education Details encouraged to contact MD due to worsening LBP.  Given printout of order for walker on epic but explained it may not work and will need to contact MD for order to be faxed to apothecary if not.    Person(s) Educated Patient    Methods Explanation;Handout    Comprehension Verbalized understanding            PT Short Term Goals - 01/21/20 1708      PT SHORT TERM GOAL #1   Title Pt to be I in her HEP to improve her functioning level.    Time 4     Period Weeks    Status New    Target Date 02/19/20      PT SHORT TERM GOAL #2   Title PT core and LE strength increased one grade to allow pt to be able to come sit to stand from a chair without the use of her UE>    Time 4    Period Weeks    Status New      PT SHORT TERM GOAL #3   Title PT to be  able to single leg stance on both LE for at least 20 seconds to allow her to feel confident walking in her home with a cane.    Time 4    Period Weeks             PT Long Term Goals - 01/21/20 1709      PT LONG TERM GOAL #1   Title PT to be completing an advanced HEP to allow pt functional ability to increase.    Time 8    Period Weeks    Status New    Target Date 03/18/20      PT LONG TERM GOAL #2   Title PT core and LE strength to be at least a 4+ to allow pt to squat down and pick items off the floor for housework/yardwork and to go up and down 8 steps    Time 8    Period Weeks    Status New      PT LONG TERM GOAL #3   Title Pt to be able to single leg stance for at least 30 seconds to allow pt to feel confident walking in her home without an assistive device.    Time 8    Period Weeks    Status New    Target Date 03/18/20                 Plan - 02/25/20 1650    Clinical Impression Statement Pt with increased distress due to LBP today.  Pt unable to complete standin gexercises so attempted mat actvities.  All movements attempted exaccerbated pain.  Requested to end session as pain too high to complete any activities.  Suggested she return to MD regarding this to assist with pain control.  Pt verbalized understanding.    Personal Factors and Comorbidities Past/Current Experience;Fitness;Time since onset of injury/illness/exacerbation    Examination-Activity Limitations Carry;Dressing;Lift;Locomotion Level;Stairs;Squat    Examination-Participation Restrictions Cleaning;Community Activity;Laundry;Shop    Stability/Clinical Decision Making Stable/Uncomplicated    Rehab  Potential Good    PT Frequency 2x / week    PT Duration 8 weeks    PT Treatment/Interventions Therapeutic activities;Therapeutic exercise;Balance training;Neuromuscular re-education;Stair training;Functional mobility training;Gait training;Patient/family education;Manual techniques    PT Next Visit Plan Complete 10th visit PN next session.  F/U on pain and if she was able to acquire a taller walker.    PT Home Exercise Plan heel raises, sit to stand, squats and bridges; 7/20 LAQs    Consulted and Agree with Plan of Care Patient           Patient will benefit from skilled therapeutic intervention in order to improve the following deficits and impairments:  Abnormal gait, Decreased activity tolerance, Decreased balance, Difficulty walking, Decreased strength, Pain  Visit Diagnosis: Difficulty in walking, not elsewhere classified  Repeated falls  Chronic midline low back pain without sciatica  Muscle weakness (generalized)     Problem List Patient Active Problem List   Diagnosis Date Noted  . Chronic diarrhea of unknown origin 01/14/2020  . Type 2 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, with long-term current use of insulin ([place]) 12/29/2019  . Hypothyroidism 12/29/2019  . Weakness of both lower extremities 11/12/2019  . Debility 09/10/2019  . ESRD (end stage renal disease) on dialysis ([place]) 09/10/2019  . Depression 09/10/2019  . Incontinence in female 09/10/2019  . Anemia in chronic kidney disease 08/19/2019  . Accelerated hypertension 08/08/2019  . MGUS (monoclonal gammopathy of unknown significance) 07/23/2019  .  GERD (gastroesophageal reflux disease) 02/28/2019  . Essential hypertension 10/28/2018  . Irregular menses 09/14/2009  . Hypercholesterolemia 11/19/2008  . Type 2 diabetes with nephropathy ([place]) 07/02/2007   Teena Irani, PTA/CLT [phone]  Teena Irani 02/25/2020, 4:54 PM  [place] [address] [place], [place], [address] Phone: [phone]   Fax:  [phone]  Name: Melanie Hall MRN: [id number] Date of Birth: 11/28/1970

## 2020-02-26 ENCOUNTER — Other Ambulatory Visit: Payer: Self-pay

## 2020-02-26 ENCOUNTER — Emergency Department (HOSPITAL_COMMUNITY)
Admission: EM | Admit: 2020-02-26 | Discharge: 2020-02-27 | Disposition: A | Discharge status: Home / Self Care | Payer: Medicaid Other | Attending: Emergency Medicine | Admitting: Emergency Medicine

## 2020-02-26 ENCOUNTER — Encounter (HOSPITAL_COMMUNITY): Payer: Self-pay

## 2020-02-26 DIAGNOSIS — G43009 Migraine without aura, not intractable, without status migrainosus: Secondary | ICD-10-CM

## 2020-02-26 DIAGNOSIS — Z79890 Hormone replacement therapy (postmenopausal): Secondary | ICD-10-CM | POA: Insufficient documentation

## 2020-02-26 DIAGNOSIS — E1121 Type 2 diabetes mellitus with diabetic nephropathy: Secondary | ICD-10-CM | POA: Diagnosis not present

## 2020-02-26 DIAGNOSIS — R110 Nausea: Secondary | ICD-10-CM | POA: Diagnosis not present

## 2020-02-26 DIAGNOSIS — Z7982 Long term (current) use of aspirin: Secondary | ICD-10-CM | POA: Insufficient documentation

## 2020-02-26 DIAGNOSIS — G9389 Other specified disorders of brain: Secondary | ICD-10-CM | POA: Diagnosis not present

## 2020-02-26 DIAGNOSIS — N186 End stage renal disease: Secondary | ICD-10-CM | POA: Insufficient documentation

## 2020-02-26 DIAGNOSIS — Z794 Long term (current) use of insulin: Secondary | ICD-10-CM | POA: Insufficient documentation

## 2020-02-26 DIAGNOSIS — Z992 Dependence on renal dialysis: Secondary | ICD-10-CM | POA: Insufficient documentation

## 2020-02-26 DIAGNOSIS — E039 Hypothyroidism, unspecified: Secondary | ICD-10-CM | POA: Diagnosis not present

## 2020-02-26 DIAGNOSIS — E1122 Type 2 diabetes mellitus with diabetic chronic kidney disease: Secondary | ICD-10-CM | POA: Diagnosis not present

## 2020-02-26 DIAGNOSIS — R4020 Unspecified coma: Secondary | ICD-10-CM | POA: Diagnosis not present

## 2020-02-26 DIAGNOSIS — I7090 Unspecified atherosclerosis: Secondary | ICD-10-CM | POA: Diagnosis not present

## 2020-02-26 DIAGNOSIS — Z79899 Other long term (current) drug therapy: Secondary | ICD-10-CM | POA: Insufficient documentation

## 2020-02-26 DIAGNOSIS — G43909 Migraine, unspecified, not intractable, without status migrainosus: Secondary | ICD-10-CM | POA: Insufficient documentation

## 2020-02-26 DIAGNOSIS — Z87891 Personal history of nicotine dependence: Secondary | ICD-10-CM | POA: Diagnosis not present

## 2020-02-26 DIAGNOSIS — G43001 Migraine without aura, not intractable, with status migrainosus: Secondary | ICD-10-CM

## 2020-02-26 DIAGNOSIS — I132 Hypertensive heart and chronic kidney disease with heart failure and with stage 5 chronic kidney disease, or end stage renal disease: Secondary | ICD-10-CM | POA: Diagnosis not present

## 2020-02-26 DIAGNOSIS — I5021 Acute systolic (congestive) heart failure: Secondary | ICD-10-CM | POA: Diagnosis not present

## 2020-02-26 DIAGNOSIS — I6529 Occlusion and stenosis of unspecified carotid artery: Secondary | ICD-10-CM | POA: Diagnosis not present

## 2020-02-26 DIAGNOSIS — R519 Headache, unspecified: Secondary | ICD-10-CM | POA: Diagnosis not present

## 2020-02-26 DIAGNOSIS — G4489 Other headache syndrome: Secondary | ICD-10-CM | POA: Diagnosis not present

## 2020-02-26 DIAGNOSIS — R112 Nausea with vomiting, unspecified: Secondary | ICD-10-CM | POA: Diagnosis not present

## 2020-02-26 MED ORDER — SODIUM CHLORIDE 0.9 % IV BOLUS
1000.0000 mL | Freq: Once | INTRAVENOUS | Status: DC
  Administered 2020-02-26: 1000 mL via INTRAVENOUS

## 2020-02-26 MED ORDER — ONDANSETRON 4 MG PO TBDP
4.0000 mg | ORAL_TABLET | Freq: Once | ORAL | Status: AC
  Administered 2020-02-26: 4 mg via ORAL
  Filled 2020-02-26: qty 1

## 2020-02-26 MED ORDER — METOCLOPRAMIDE HCL 5 MG/ML IJ SOLN
10.0000 mg | Freq: Once | INTRAMUSCULAR | Status: AC
  Administered 2020-02-26: 10 mg via INTRAVENOUS
  Filled 2020-02-26: qty 2

## 2020-02-26 MED ORDER — ONDANSETRON 4 MG PO TBDP: ORAL_TABLET | ORAL | 0 refills | Status: DC

## 2020-02-26 MED ORDER — OXYCODONE-ACETAMINOPHEN 5-325 MG PO TABS
1.0000 | ORAL_TABLET | Freq: Once | ORAL | Status: AC
  Administered 2020-02-27: 1 via ORAL
  Filled 2020-02-26: qty 1

## 2020-02-26 MED ORDER — DIPHENHYDRAMINE HCL 50 MG/ML IJ SOLN
25.0000 mg | Freq: Once | INTRAMUSCULAR | Status: AC
  Administered 2020-02-26: 25 mg via INTRAVENOUS
  Filled 2020-02-26: qty 1

## 2020-02-26 MED ORDER — OXYCODONE-ACETAMINOPHEN 5-325 MG PO TABS: 1.0000 | ORAL_TABLET | Freq: Four times a day (QID) | ORAL | 0 refills | Status: DC | PRN

## 2020-02-26 MED ORDER — KETOROLAC TROMETHAMINE 30 MG/ML IJ SOLN
30.0000 mg | Freq: Once | INTRAMUSCULAR | Status: AC
  Administered 2020-02-26: 30 mg via INTRAVENOUS
  Filled 2020-02-26: qty 1

## 2020-02-26 NOTE — Discharge Instructions (Addendum)
Follow-up with your doctor next week for recheck, consider seeing a neurologist to help manage your migraine headaches.  Dr. Merlene Laughter is a neurologist in [place].  Call his office for an appointment.  Look at the migraine information sheet to see if there are things you could avoid to help stop precipitating a headache to occur.

## 2020-02-26 NOTE — ED Notes (Signed)
Pt in bed with eyes closed, pt arouses easily to verbal stim, upon arousal pt states that the medications didn't help much, pt then appears to fall back asleep.  resps even and unlabored

## 2020-02-26 NOTE — ED Triage Notes (Signed)
Pt reports migraine headache since this am with nausea and vomiting

## 2020-02-26 NOTE — ED Provider Notes (Signed)
[facility] Provider Note   CSN: [id number] Arrival date & time: 02/26/20  1700     History Chief Complaint  Patient presents with  . Migraine    n/v    SHENIQUE Hall is a 49 y.o. female.  Patient complains of migraine headache with nausea and vomiting today.  Some nausea.  Patient has a history of migraine  The history is provided by the patient. No language interpreter was used.  Migraine This is a recurrent problem. The current episode started 12 to 24 hours ago. The problem occurs constantly. The problem has not changed since onset.Associated symptoms include headaches. Pertinent negatives include no chest pain and no abdominal pain. Nothing aggravates the symptoms. Nothing relieves the symptoms. She has tried nothing for the symptoms. The treatment provided no relief.       Past Medical History:  Diagnosis Date  . Acute cystitis without hematuria   . Acute kidney failure ([place]) 02/28/2019  . Acute midline thoracic back pain 12/29/2019  . Acute on chronic renal failure ([employer]) 02/28/2019  . Acute systolic heart failure ([place]) 10/21/2019  . Anemia   . Cataracts, bilateral    surgery to remove  . CATARACTS, BILATERAL 07/02/2007   Qualifier: Diagnosis of  By: Isla Pence    . Closed fracture of left femur ([place]) 10/28/2018  . Closed fracture of right ankle 11/06/2017  . Diabetes mellitus    type 2  . Emphysematous cystitis 08/26/2018  . Encounter for gynecological examination with Papanicolaou smear of cervix 01/14/2020  . Encounter for screening fecal occult blood testing 01/14/2020  . Encounter for screening for malignant neoplasm of cervix 12/29/2019  . Encounter for screening mammogram for malignant neoplasm of breast 12/29/2019  . ESRD on hemodialysis ([place])   . GERD (gastroesophageal reflux disease)   . Hyperlipidemia   . Hypertension   . Hypokalemia 08/26/2018  . IRREGULAR MENSES 09/14/2009   Qualifier: Diagnosis of  By: Hassell Done FNP, Tori Milks    . Loose  stools 11/12/2019  . Normocytic anemia 08/26/2018  . PARONYCHIA, RIGHT GREAT TOE 07/30/2008   Qualifier: Diagnosis of  By: Hassell Done FNP, Tori Milks    . Pressure ulcer 09/10/2019  . Right arm weakness 08/08/2019  . Sprain of left ankle   . STEMI (ST elevation myocardial infarction) ([place]) 10/18/2019  . STEMI involving right coronary artery ([place]) 10/18/2019  . Stroke ([place])   . SVD (spontaneous vaginal delivery)    x 4  . Vaginosis 08/26/2018  . Weakness 09/10/2019  . Weakness of both lower extremities     Patient Active Problem List   Diagnosis Date Noted  . Chronic diarrhea of unknown origin 01/14/2020  . Type 2 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, with long-term current use of insulin ([place]) 12/29/2019  . Hypothyroidism 12/29/2019  . Weakness of both lower extremities 11/12/2019  . Debility 09/10/2019  . ESRD (end stage renal disease) on dialysis ([place]) 09/10/2019  . Depression 09/10/2019  . Incontinence in female 09/10/2019  . Anemia in chronic kidney disease 08/19/2019  . Accelerated hypertension 08/08/2019  . MGUS (monoclonal gammopathy of unknown significance) 07/23/2019  . GERD (gastroesophageal reflux disease) 02/28/2019  . Essential hypertension 10/28/2018  . Irregular menses 09/14/2009  . Hypercholesterolemia 11/19/2008  . Type 2 diabetes with nephropathy ([place]) 07/02/2007    Past Surgical History:  Procedure Laterality Date  . A/V FISTULAGRAM Left 01/12/2020   Procedure: A/V FISTULAGRAM;  Surgeon: Serafina Mitchell, MD;  Location: [facility];  Service: Cardiovascular;  Laterality: Left;  . AV FISTULA PLACEMENT Left 08/17/2019   Procedure: LEFT BRACHIAL CEPHALIC ARTERIOVENOUS (AV) FISTULA;  Surgeon: Angelia Mould, MD;  Location: [facility];  Service: Vascular;  Laterality: Left;  . CORONARY STENT INTERVENTION N/A 10/18/2019   Procedure: CORONARY STENT INTERVENTION;  Surgeon: Sherren Mocha, MD;  Location: [facility];  Service:  Cardiovascular;  Laterality: N/A;  . CORONARY/GRAFT ACUTE MI REVASCULARIZATION N/A 10/18/2019   Procedure: Coronary/Graft Acute MI Revascularization;  Surgeon: Sherren Mocha, MD;  Location: [facility];  Service: Cardiovascular;  Laterality: N/A;  . EYE SURGERY Bilateral    cataracts removed  . FEMUR IM NAIL Left 10/28/2018   Procedure: RETROGRADE FEMORAL NAILING;  Surgeon: Meredith Pel, MD;  Location: [facility];  Service: Orthopedics;  Laterality: Left;  . IM NAILING FEMORAL SHAFT RETROGRADE Left 10/28/2018  . INTRAVASCULAR ULTRASOUND/IVUS N/A 10/18/2019   Procedure: Intravascular Ultrasound/IVUS;  Surgeon: Sherren Mocha, MD;  Location: [facility];  Service: Cardiovascular;  Laterality: N/A;  . IR FLUORO GUIDE CV LINE RIGHT  08/11/2019  . IR THORACENTESIS ASP PLEURAL SPACE W/IMG GUIDE  11/12/2019  . IR US GUIDE VASC ACCESS RIGHT  08/11/2019  . KNEE SURGERY Left   . LEFT HEART CATH AND CORONARY ANGIOGRAPHY N/A 10/18/2019   Procedure: LEFT HEART CATH AND CORONARY ANGIOGRAPHY;  Surgeon: Sherren Mocha, MD;  Location: [facility];  Service: Cardiovascular;  Laterality: N/A;  . PERIPHERAL VASCULAR BALLOON ANGIOPLASTY Left 01/12/2020   Procedure: PERIPHERAL VASCULAR BALLOON ANGIOPLASTY;  Surgeon: Serafina Mitchell, MD;  Location: [facility];  Service: Cardiovascular;  Laterality: Left;  AVF  . RADIOLOGY WITH ANESTHESIA N/A 09/15/2019   Procedure: [facility] AND LUMBER LOWER BACK PAIN;  Surgeon: Radiologist, Medication, MD;  Location: [facility];  Service: Radiology;  Laterality: N/A;  . TUBAL LIGATION       OB History    Gravida  4   Para  4   Term  4   Preterm      AB      Living  4     SAB      TAB      Ectopic      Multiple      Live Births  4           Family History  Problem Relation Age of Onset  . Diabetes Mother   . Hypertension Mother   . Bipolar disorder Mother   . Brain cancer Maternal Aunt   . Heart disease Maternal Grandmother   .  Diabetes Maternal Grandmother   . Breast cancer Maternal Aunt   . Depression Father        Committed suicide  . Diabetes Sister     Social History   Tobacco Use  . Smoking status: Former Smoker    Packs/day: 0.25    Years: 2.00    Pack years: 0.50    Types: Cigarettes    Quit date: 1997    Years since quitting: 24.6  . Smokeless tobacco: Never Used  Vaping Use  . Vaping Use: Never used  Substance Use Topics  . Alcohol use: No  . Drug use: No    Home Medications Prior to Admission medications   Medication Sig Start Date End Date Taking? Authorizing Provider  aspirin 81 MG EC tablet Take 1 tablet (81 mg total) by mouth daily. 01/13/20   Perlie Mayo, NP  atorvastatin (LIPITOR) 80 MG tablet  Take 1 tablet (80 mg total) by mouth daily at 6 PM. 11/24/19   Kathlen Mody, Nicki Reaper T, PA-C  carvedilol (COREG) 6.25 MG tablet Take 1.5 tablets (9.375 mg total) by mouth 2 (two) times daily. 01/13/20   Perlie Mayo, NP  epoetin alfa (EPOGEN) 3000 UNIT/ML injection Inject 3,000 Units into the skin every 14 (fourteen) days.  06/12/19   [provider]  gabapentin (NEURONTIN) 300 MG capsule Take 1 capsule (300 mg total) by mouth at bedtime. 02/18/20   Perlie Mayo, NP  hydrALAZINE (APRESOLINE) 10 MG tablet Take 1 tablet (10 mg total) by mouth in the morning and at bedtime. 11/24/19   Richardson Dopp T, PA-C  insulin aspart protamine- aspart (NOVOLOG MIX 70/30) (70-30) 100 UNIT/ML injection Inject 0.1 mLs (10 Units total) into the skin 2 (two) times daily with a meal. 02/18/20   Perlie Mayo, NP  isosorbide mononitrate (IMDUR) 30 MG 24 hr tablet Take 0.5 tablets (15 mg total) by mouth daily. 01/05/20 01/04/21  Richardson Dopp T, PA-C  levothyroxine (SYNTHROID) 25 MCG tablet Take 1 tablet (25 mcg total) by mouth daily before breakfast. 02/18/20   Perlie Mayo, NP  multivitamin (RENA-VIT) TABS tablet Take 1 tablet by mouth at bedtime. 08/25/19   Hoyt Koch, MD  nitroGLYCERIN (NITROSTAT) 0.4  MG SL tablet Place 1 tablet (0.4 mg total) under the tongue every 5 (five) minutes x 3 doses as needed for chest pain. 11/24/19   Richardson Dopp T, PA-C  sertraline (ZOLOFT) 25 MG tablet Take 1 tablet (25 mg total) by mouth daily. 02/18/20 03/19/20  Perlie Mayo, NP  tamsulosin (FLOMAX) 0.4 MG CAPS capsule Take 1 capsule (0.4 mg total) by mouth daily after supper. 02/18/20   Perlie Mayo, NP  ticagrelor (BRILINTA) 90 MG TABS tablet Take 1 tablet (90 mg total) by mouth 2 (two) times daily. 11/24/19   Richardson Dopp T, PA-C  zolpidem (AMBIEN) 5 MG tablet Take 1 tablet (5 mg total) by mouth at bedtime as needed for sleep. 10/29/19   Soyla Dryer, PA-C    Allergies    Penicillins  Review of Systems   Review of Systems  Constitutional: Negative for appetite change and fatigue.  HENT: Negative for congestion, ear discharge and sinus pressure.   Eyes: Negative for discharge.  Respiratory: Negative for cough.   Cardiovascular: Negative for chest pain.  Gastrointestinal: Negative for abdominal pain and diarrhea.  Genitourinary: Negative for frequency and hematuria.  Musculoskeletal: Negative for back pain.  Skin: Negative for rash.  Neurological: Positive for headaches. Negative for seizures.  Psychiatric/Behavioral: Negative for hallucinations.    Physical Exam Updated Vital Signs BP (!) 121/53 (BP Location: Right Arm)   Pulse 97   Temp 98 F (36.7 C) (Oral)   Resp 16   Ht 5' 2.5" (1.588 m)   Wt 61 kg   LMP 12/17/2016   SpO2 94%   BMI 24.20 kg/m   Physical Exam Vitals and nursing note reviewed.  Constitutional:      Appearance: She is well-developed.  HENT:     Head: Normocephalic.     Nose: Nose normal.  Eyes:     General: No scleral icterus.    Conjunctiva/sclera: Conjunctivae normal.  Neck:     Thyroid: No thyromegaly.  Cardiovascular:     Rate and Rhythm: Normal rate and regular rhythm.     Heart sounds: No murmur heard.  No friction rub. No gallop.   Pulmonary:  Breath sounds: No stridor. No wheezing or rales.  Chest:     Chest wall: No tenderness.  Abdominal:     General: There is no distension.     Tenderness: There is no abdominal tenderness. There is no rebound.  Musculoskeletal:        General: Normal range of motion.     Cervical back: Neck supple.  Lymphadenopathy:     Cervical: No cervical adenopathy.  Skin:    Findings: No erythema or rash.  Neurological:     Mental Status: She is alert and oriented to person, place, and time.     Motor: No abnormal muscle tone.     Coordination: Coordination normal.  Psychiatric:        Behavior: Behavior normal.     ED Results / Procedures / Treatments   Labs (all labs ordered are listed, but only abnormal results are displayed) Labs Reviewed - No data to display  EKG None  Radiology No results found.  Procedures Procedures (including critical care time)  Medications Ordered in ED Medications  oxyCODONE-acetaminophen (PERCOCET/ROXICET) 5-325 MG per tablet 1 tablet (has no administration in time range)  ondansetron (ZOFRAN-ODT) disintegrating tablet 4 mg (4 mg Oral Given 02/26/20 1916)  ketorolac (TORADOL) 30 MG/ML injection 30 mg (30 mg Intravenous Given 02/26/20 2106)  diphenhydrAMINE (BENADRYL) injection 25 mg (25 mg Intravenous Given 02/26/20 2106)  metoCLOPramide (REGLAN) injection 10 mg (10 mg Intravenous Given 02/26/20 2106)  sodium chloride 0.9 % bolus 1,000 mL (1,000 mLs Intravenous New Bag/Given 02/26/20 2109)    ED Course  I have reviewed the triage vital signs and the nursing notes.  Pertinent labs & imaging results that were available during my care of the patient were reviewed by me and considered in my medical decision making (see chart for details).    MDM Rules/Calculators/A&P                          Patient with a migraine headache. It did not respond to a migraine cocktail. Patient to get a CT scan of the head Final Clinical Impression(s) / ED  Diagnoses Final diagnoses:  None    Rx / DC Orders ED Discharge Orders    None       Milton Ferguson, MD 02/29/20 1142

## 2020-02-27 ENCOUNTER — Emergency Department (HOSPITAL_COMMUNITY): Payer: Medicaid Other

## 2020-02-27 DIAGNOSIS — I6529 Occlusion and stenosis of unspecified carotid artery: Secondary | ICD-10-CM | POA: Diagnosis not present

## 2020-02-27 DIAGNOSIS — G9389 Other specified disorders of brain: Secondary | ICD-10-CM | POA: Diagnosis not present

## 2020-02-27 DIAGNOSIS — I7090 Unspecified atherosclerosis: Secondary | ICD-10-CM | POA: Diagnosis not present

## 2020-02-27 DIAGNOSIS — R519 Headache, unspecified: Secondary | ICD-10-CM | POA: Diagnosis not present

## 2020-02-27 LAB — CBG MONITORING, ED: Glucose-Capillary: 117 mg/dL — ABNORMAL HIGH (ref 70–99)

## 2020-02-27 IMAGING — CT CT HEAD W/O CM
3 series · 15 of 46 positions shown, 18 images · non-contrast
Comparison: CT [DATE]

CLINICAL DATA: Migraine headache, concern for cerebral hemorrhage

EXAM:
CT HEAD WITHOUT CONTRAST
TECHNIQUE: Contiguous axial images were obtained from the base of the skull
through the vertex without intravenous contrast.

[Series 2: head w o · axial · 0.40mm/px · z∈[-52,+68]mm · 9 of 29 slices shown, 12 images]
[im 3/29  brain]
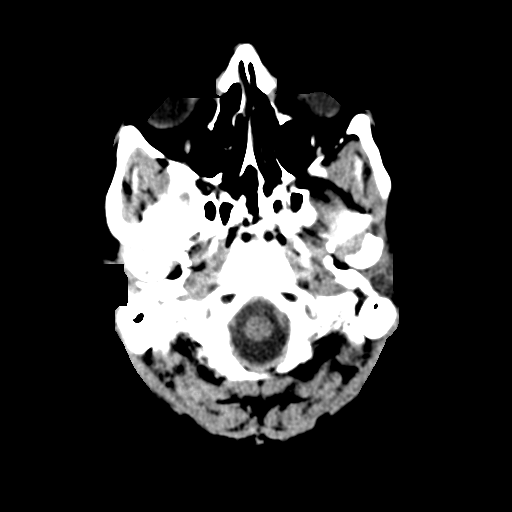
[im 3/29  bone]
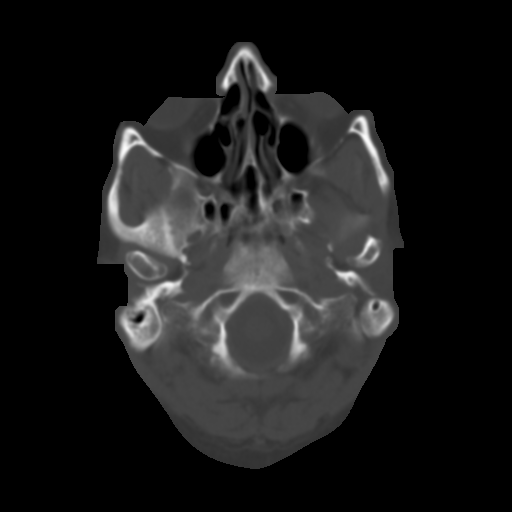
[im 6/29  brain]
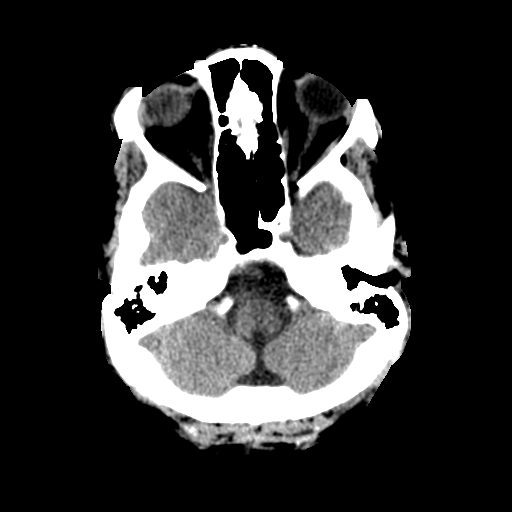
[im 9/29  brain]
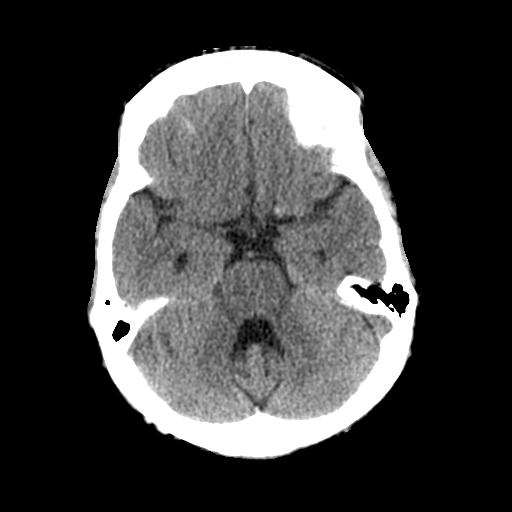
[im 12/29  brain]
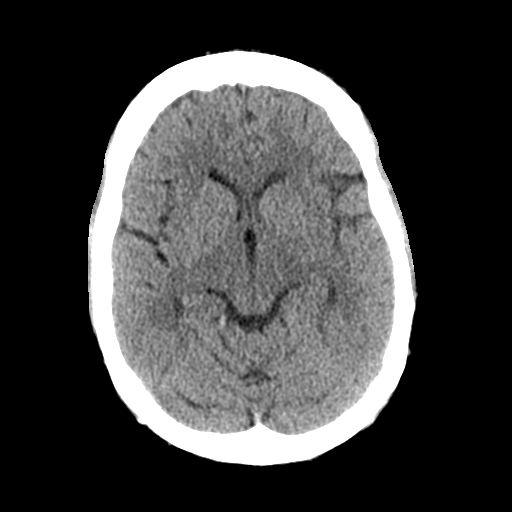
[im 15/29  brain]
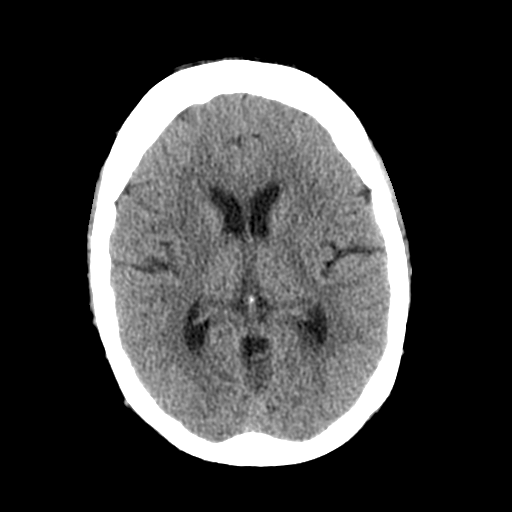
[im 15/29  bone]
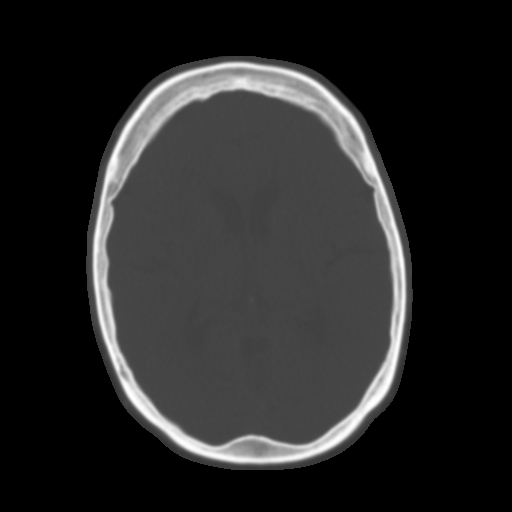
[im 18/29  brain]
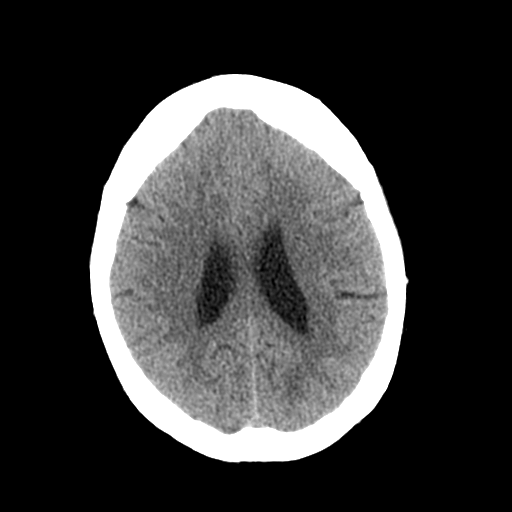
[im 21/29  brain]
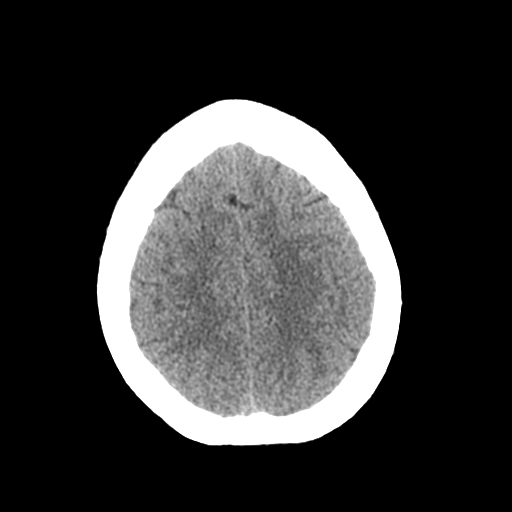
[im 24/29  brain]
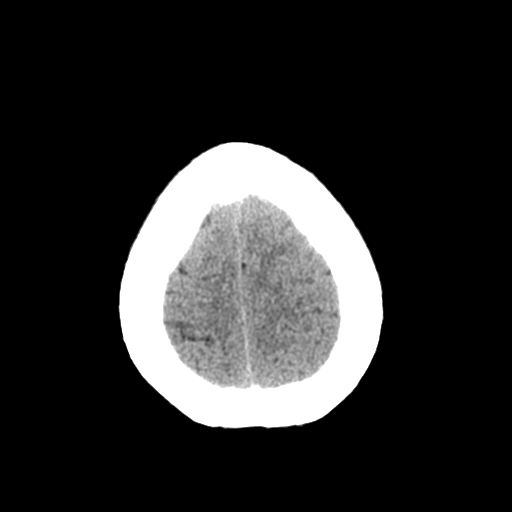
[im 27/29  brain]
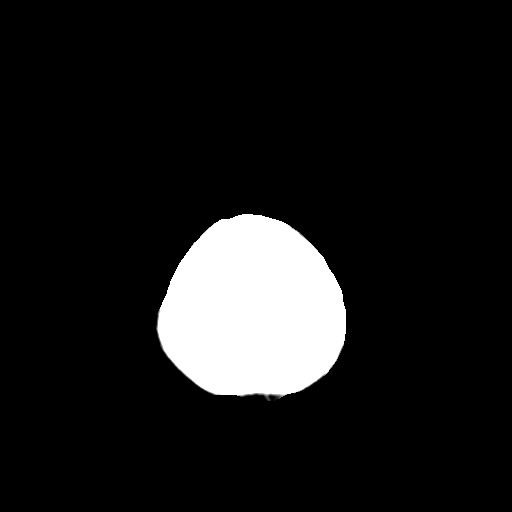
[im 27/29  bone]
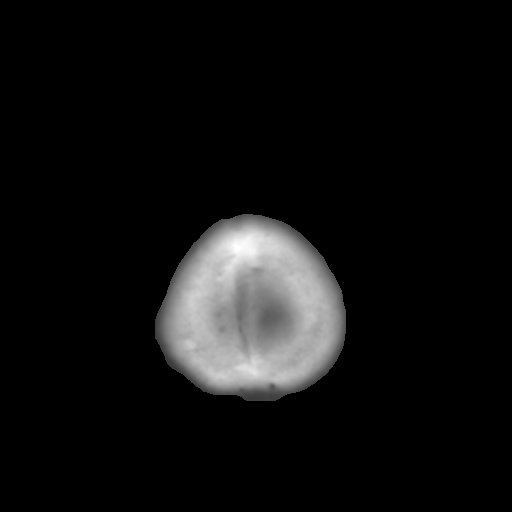

[Series 4: coronal soft · coronal · 0.35mm/px · 3 of 71 slices shown]
[im 24/71  brain]
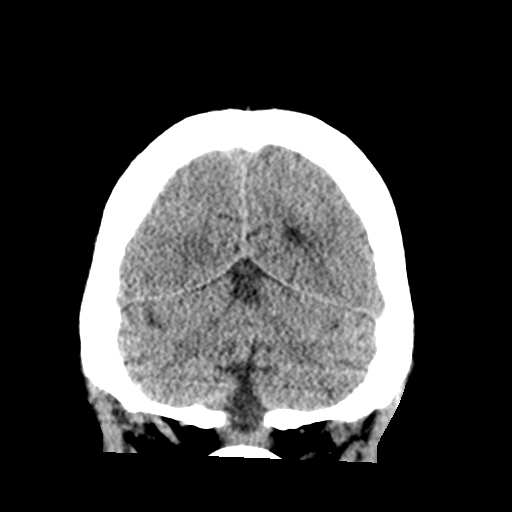
[im 32/71  brain]
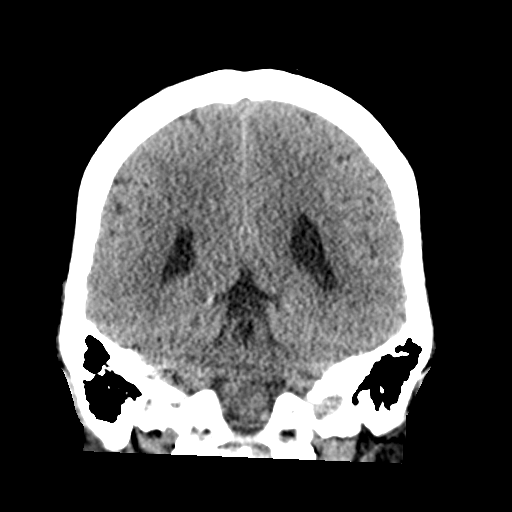
[im 39/71  brain]
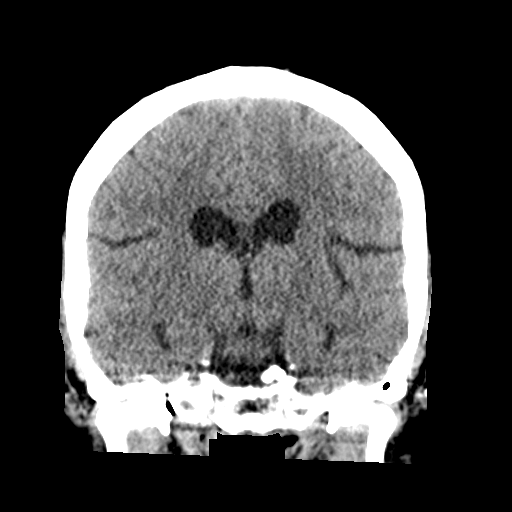

[Series 5: sagittal soft · sagittal · 0.33mm/px · 3 of 48 slices shown]
[im 16/48  brain]
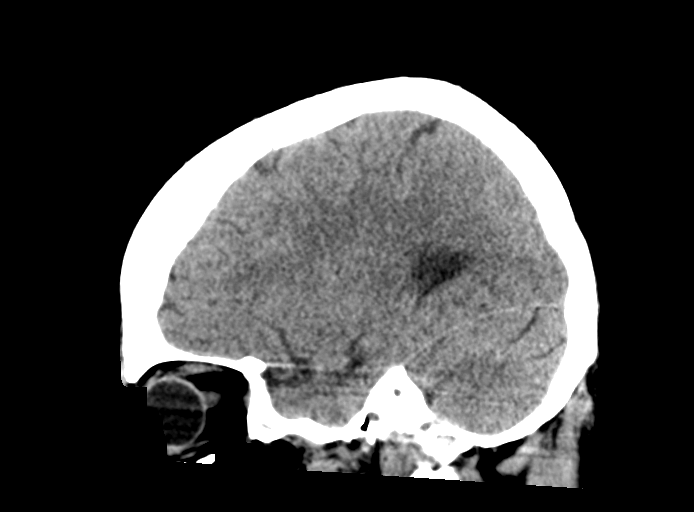
[im 24/48  brain]
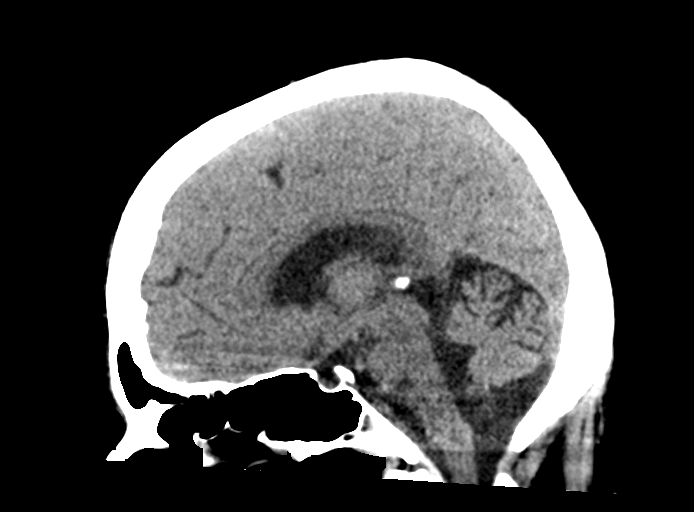
[im 32/48  brain]
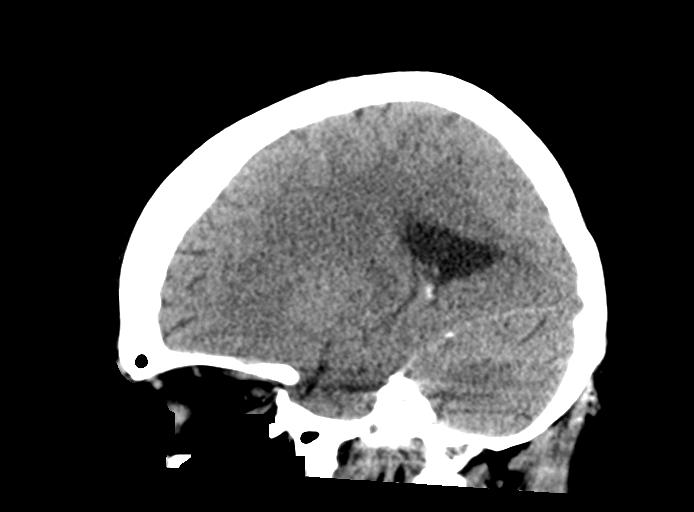

[15 of 46 positions shown; findings below may reference images not displayed]

FINDINGS: Brain: No evidence of acute infarction, hemorrhage, hydrocephalus,
extra-axial collection or mass lesion/mass effect. Stable mild
patchy areas of white matter hypoattenuation are nonspecific but can
be seen as sequela of chronic migraine or microvascular angiopathy.
No significant age advanced volume loss. Basal cisterns are patent.
Midline intracranial structures are unremarkable.

Vascular: Atherosclerotic calcification of the carotid siphons. No
hyperdense vessel.

Skull: No calvarial fracture or suspicious osseous lesion. No scalp
swelling or hematoma.

Sinuses/Orbits: Paranasal sinuses and mastoid air cells are
predominantly clear. Orbital structures are unremarkable aside from
prior lens extractions.

Other: None
IMPRESSION: 1. No acute intracranial abnormality. Specifically no evidence of
intracranial hemorrhage.

## 2020-02-27 MED ORDER — DEXAMETHASONE SODIUM PHOSPHATE 10 MG/ML IJ SOLN
10.0000 mg | Freq: Once | INTRAMUSCULAR | Status: AC
  Administered 2020-02-27: 10 mg via INTRAVENOUS
  Filled 2020-02-27: qty 1

## 2020-02-27 NOTE — ED Provider Notes (Signed)
Patient left at change of shift to check results of her head CT and a discharge.  Patient has end-stage renal disease and history of migraines.  She was given migraine cocktail without relief and then Percocet and then a head CT.  When I go in the room she is sleeping.  When she is awake and she states her headaches about the same.  She states she gets headaches daily.  She has trouble staying awake to talk to me.  I reviewed her medication list and she was given Decadron IV.  CBG was done because patient has history of diabetes.  Patient was rechecked at 415.  She is sleeping.  When she wakes up she states her headache is better.  She was discharged home.   Results for orders placed or performed during the hospital encounter of 02/26/20  CBG monitoring, ED  Result Value Ref Range   Glucose-Capillary 117 (H) 70 - 99 mg/dL   Laboratory interpretation all normal except mild nonfasting hyperglycemia    CT Head Wo Contrast  Result Date: 02/27/2020 CLINICAL DATA:  Migraine headache, concern for cerebral hemorrhage EXAM: CT HEAD WITHOUT CONTRAST TECHNIQUE: Contiguous axial images were obtained from the base of the skull through the vertex without intravenous contrast. COMPARISON:  CT 11/04/2019 FINDINGS: Brain: No evidence of acute infarction, hemorrhage, hydrocephalus, extra-axial collection or mass lesion/mass effect. Stable mild patchy areas of white matter hypoattenuation are nonspecific but can be seen as sequela of chronic migraine or microvascular angiopathy. No significant age advanced volume loss. Basal cisterns are patent. Midline intracranial structures are unremarkable. Vascular: Atherosclerotic calcification of the carotid siphons. No hyperdense vessel. Skull: No calvarial fracture or suspicious osseous lesion. No scalp swelling or hematoma. Sinuses/Orbits: Paranasal sinuses and mastoid air cells are predominantly clear. Orbital structures are unremarkable aside from prior lens extractions.  Other: None IMPRESSION: 1. No acute intracranial abnormality. Specifically no evidence of intracranial hemorrhage. Electronically Signed   By: Lovena Le M.D.   On: 02/27/2020 01:48   Diagnoses that have been ruled out:  None  Diagnoses that are still under consideration:  None  Final diagnoses:  Migraine without aura and with status migrainosus, not intractable  Migraine without aura and without status migrainosus, not intractable   Plan discharge  Rolland Porter, MD, Barbette Or, MD 02/27/20 [phone]

## 2020-02-29 ENCOUNTER — Telehealth: Payer: Self-pay | Admitting: *Deleted

## 2020-02-29 NOTE — Telephone Encounter (Signed)
Medicaid Managed Care team Transition of Care Assessment outreach attempt #1 made today. Unable to reach patient. HIPPA compliant voice message left requesting a return call. The patient has also been enrolled in an automated discharge follow up call series and will receive two outreach attempts for transition of care assessment. Contact information has been left for the patient and the [facility] Managed Care team is available to provide assistance to the patient at any time.    Lenor Coffin, RN, BSN, [facility] [phone]

## 2020-03-01 ENCOUNTER — Ambulatory Visit (HOSPITAL_COMMUNITY): Payer: Medicaid Other | Admitting: Physical Therapy

## 2020-03-01 ENCOUNTER — Telehealth: Payer: Self-pay | Admitting: *Deleted

## 2020-03-01 ENCOUNTER — Other Ambulatory Visit: Payer: Self-pay

## 2020-03-01 ENCOUNTER — Encounter (HOSPITAL_COMMUNITY): Payer: Self-pay | Admitting: Physical Therapy

## 2020-03-01 DIAGNOSIS — M6281 Muscle weakness (generalized): Secondary | ICD-10-CM

## 2020-03-01 DIAGNOSIS — G8929 Other chronic pain: Secondary | ICD-10-CM | POA: Diagnosis not present

## 2020-03-01 DIAGNOSIS — M545 Low back pain, unspecified: Secondary | ICD-10-CM

## 2020-03-01 DIAGNOSIS — R262 Difficulty in walking, not elsewhere classified: Secondary | ICD-10-CM | POA: Diagnosis not present

## 2020-03-01 DIAGNOSIS — R296 Repeated falls: Secondary | ICD-10-CM | POA: Diagnosis not present

## 2020-03-01 NOTE — Telephone Encounter (Signed)
Called patient to assist with follow up visit with pcp concerning recent emergency visit.                                                    Melanie Hall                                                      PEC                                                  [phone]

## 2020-03-01 NOTE — Therapy (Signed)
[facility] [address] [place], [place], [address] Phone: [phone]   Fax:  [phone]  Physical Therapy Treatment and Progress  Note  Patient Details  Name: Melanie Hall MRN: [id number] Date of Birth: 02/14/1971 Referring Provider (PT): Cherly Beach   Progress Note Reporting Period 01/21/20 to 03/01/20  See note below for Objective Data and Assessment of Progress/Goals.        Encounter Date: 03/01/2020   PT End of Session - 03/01/20 1004    Visit Number 10    Number of Visits 16    Date for PT Re-Evaluation 03/17/20    Authorization Type [employer]- approved 24 visits 01/22/20 to 03/17/20    Authorization - Visit Number 10    Authorization - Number of Visits 24    Progress Note Due on Visit 20    PT Start Time 1000    PT Stop Time 1040    PT Time Calculation (min) 40 min    Activity Tolerance Patient tolerated treatment well    Behavior During Therapy WFL for tasks assessed/performed           Past Medical History:  Diagnosis Date  . Acute cystitis without hematuria   . Acute kidney failure ([place]) 02/28/2019  . Acute midline thoracic back pain 12/29/2019  . Acute on chronic renal failure ([place]) 02/28/2019  . Acute systolic heart failure ([place]) 10/21/2019  . Anemia   . Cataracts, bilateral    surgery to remove  . CATARACTS, BILATERAL 07/02/2007   Qualifier: Diagnosis of  By: Isla Pence    . Closed fracture of left femur ([place]) 10/28/2018  . Closed fracture of right ankle 11/06/2017  . Diabetes mellitus    type 2  . Emphysematous cystitis 08/26/2018  . Encounter for gynecological examination with Papanicolaou smear of cervix 01/14/2020  . Encounter for screening fecal occult blood testing 01/14/2020  . Encounter for screening for malignant neoplasm of cervix 12/29/2019  . Encounter for screening mammogram for malignant neoplasm of breast 12/29/2019  . ESRD on hemodialysis ([place])   . GERD (gastroesophageal reflux disease)    . Hyperlipidemia   . Hypertension   . Hypokalemia 08/26/2018  . IRREGULAR MENSES 09/14/2009   Qualifier: Diagnosis of  By: Hassell Done FNP, Tori Milks    . Loose stools 11/12/2019  . Normocytic anemia 08/26/2018  . PARONYCHIA, RIGHT GREAT TOE 07/30/2008   Qualifier: Diagnosis of  By: Hassell Done FNP, Tori Milks    . Pressure ulcer 09/10/2019  . Right arm weakness 08/08/2019  . Sprain of left ankle   . STEMI (ST elevation myocardial infarction) ([place]) 10/18/2019  . STEMI involving right coronary artery ([place]) 10/18/2019  . Stroke ([place])   . SVD (spontaneous vaginal delivery)    x 4  . Vaginosis 08/26/2018  . Weakness 09/10/2019  . Weakness of both lower extremities     Past Surgical History:  Procedure Laterality Date  . A/V FISTULAGRAM Left 01/12/2020   Procedure: A/V FISTULAGRAM;  Surgeon: Serafina Mitchell, MD;  Location: [facility];  Service: Cardiovascular;  Laterality: Left;  . AV FISTULA PLACEMENT Left 08/17/2019   Procedure: LEFT BRACHIAL CEPHALIC ARTERIOVENOUS (AV) FISTULA;  Surgeon: Angelia Mould, MD;  Location: [facility];  Service: Vascular;  Laterality: Left;  . CORONARY STENT INTERVENTION N/A 10/18/2019   Procedure: CORONARY STENT INTERVENTION;  Surgeon: Sherren Mocha, MD;  Location: [facility];  Service: Cardiovascular;  Laterality: N/A;  . CORONARY/GRAFT ACUTE MI REVASCULARIZATION N/A 10/18/2019  Procedure: Coronary/Graft Acute MI Revascularization;  Surgeon: Sherren Mocha, MD;  Location: [facility];  Service: Cardiovascular;  Laterality: N/A;  . EYE SURGERY Bilateral    cataracts removed  . FEMUR IM NAIL Left 10/28/2018   Procedure: RETROGRADE FEMORAL NAILING;  Surgeon: Meredith Pel, MD;  Location: [facility];  Service: Orthopedics;  Laterality: Left;  . IM NAILING FEMORAL SHAFT RETROGRADE Left 10/28/2018  . INTRAVASCULAR ULTRASOUND/IVUS N/A 10/18/2019   Procedure: Intravascular Ultrasound/IVUS;  Surgeon: Sherren Mocha, MD;  Location: [facility];  Service:  Cardiovascular;  Laterality: N/A;  . IR FLUORO GUIDE CV LINE RIGHT  08/11/2019  . IR THORACENTESIS ASP PLEURAL SPACE W/IMG GUIDE  11/12/2019  . IR US GUIDE VASC ACCESS RIGHT  08/11/2019  . KNEE SURGERY Left   . LEFT HEART CATH AND CORONARY ANGIOGRAPHY N/A 10/18/2019   Procedure: LEFT HEART CATH AND CORONARY ANGIOGRAPHY;  Surgeon: Sherren Mocha, MD;  Location: [facility];  Service: Cardiovascular;  Laterality: N/A;  . PERIPHERAL VASCULAR BALLOON ANGIOPLASTY Left 01/12/2020   Procedure: PERIPHERAL VASCULAR BALLOON ANGIOPLASTY;  Surgeon: Serafina Mitchell, MD;  Location: [facility];  Service: Cardiovascular;  Laterality: Left;  AVF  . RADIOLOGY WITH ANESTHESIA N/A 09/15/2019   Procedure: [facility] AND LUMBER LOWER BACK PAIN;  Surgeon: Radiologist, Medication, MD;  Location: [facility];  Service: Radiology;  Laterality: N/A;  . TUBAL LIGATION      There were no vitals filed for this visit.   Subjective Assessment - 03/01/20 1035    Subjective Overall feels about 40% better since the start of therapy but would like to walk without the walker and have no pain. Current pain level is 15/10 which is worse than when she started coming in to therapy. Some days are ok and some days are not but overall symptoms continue to progressively get worse. Nothing makes her pain feel better. States she feels she can move a little faster and get up a little bit better. States overall her balance is not better.    Currently in Pain? Yes    Pain Score --   15   Pain Location Back    Pain Orientation Mid;Lower    Pain Descriptors / Indicators Aching;Sore    Pain Type Chronic pain    Pain Onset More than a month ago              [facility] PT Assessment - 03/01/20 0001      Assessment   Medical Diagnosis LE weakness, gait instability and back pain     Referring Provider (PT) Cherly Beach     Onset Date/Surgical Date 10/27/18      Single Leg Stance   Comments R 30 with 1 UE support, unable to perform  on left  side with UE suppor- unable to pert      Strength   Right Hip Flexion 4-/5    Right Hip Extension 3/5    Right Hip ABduction 3/5    Left Hip Flexion 4-/5    Left Hip Extension 3-/5    Left Hip ABduction 3/5    Right Knee Flexion 4/5    Right Knee Extension 4+/5    Left Knee Flexion 4-/5    Left Knee Extension 4/5   pain in knee   Right Ankle Dorsiflexion 2+/5    Left Ankle Dorsiflexion 2+/5      Transfers   Transfers Sit to Stand;Stand to Sit    Sit to Stand With  upper extremity assist;5: Supervision   unable to perform without use of arms due to safety   Five time sit to stand comments  19.4 sec with heavy use of hands and not coming all the way upright.     Stand to Sit 5: Supervision;With upper extremity assist      Ambulation/Gait   Ambulation/Gait Yes    Ambulation/Gait Assistance 5: Supervision    Ambulation Distance (Feet) 162 Feet    Assistive device Rolling walker    Gait Pattern Decreased step length - right;Decreased step length - left;Decreased hip/knee flexion - right;Decreased hip/knee flexion - left;Right foot flat;Left foot flat   hyperextension in knees bilaterally   Gait Comments 2MW- felt legs were given out/tight                         [facility] Adult PT Treatment/Exercise - 03/01/20 0001      Knee/Hip Exercises: Aerobic   Nustep 5 minutes - resistance level 4 - cues to push with legs                  PT Education - 03/01/20 1036    Education Details on current condition, on current functional status, plan moving forward and following up with MD.    Terence Lux) Educated Patient    Methods Explanation    Comprehension Verbalized understanding            PT Short Term Goals - 03/01/20 1016      PT SHORT TERM GOAL #1   Title Pt to be I in her HEP to improve her functioning level.    Baseline reports adherence to HEP    Time 4    Period Weeks    Status On-going    Target Date 02/19/20      PT SHORT TERM GOAL #2   Title PT  core and LE strength increased one grade to allow pt to be able to come sit to stand from a chair without the use of her UE>    Baseline unable    Time 4    Period Weeks    Status On-going      PT SHORT TERM GOAL #3   Title PT to be able to single leg stance on both LE for at least 20 seconds to allow her to feel confident walking in her home with a cane.    Baseline R 30 with 1 UE support, unable to perform  on left side with UE support    Time 4    Period Weeks    Status On-going             PT Long Term Goals - 03/01/20 1027      PT LONG TERM GOAL #1   Title PT to be completing an advanced HEP to allow pt functional ability to increase.    Time 8    Period Weeks    Status On-going      PT LONG TERM GOAL #2   Title PT core and LE strength to be at least a 4+ to allow pt to squat down and pick items off the floor for housework/yardwork and to go up and down 8 steps    Time 8    Period Weeks    Status On-going      PT LONG TERM GOAL #3   Title Pt to be able to single leg stance for at least 30 seconds to allow pt  to feel confident walking in her home without an assistive device.    Time 8    Period Weeks    Status On-going                 Plan - 03/01/20 1038    Clinical Impression Statement Progress note performed on this date. No short or long term goals met at this time. Improvements in gait speed and STS noted  but continued deficits noted. Discussed following up with MD secondary to continued severe pain and balance deficits, patient verbalized understanding. Will continue with current POC with focus on balance and functional strength as tolerated, anticipate patient to discharge at end of initial POC with MD follow-up pending patient presentation at that time.    Personal Factors and Comorbidities Past/Current Experience;Fitness;Time since onset of injury/illness/exacerbation    Examination-Activity Limitations Carry;Dressing;Lift;Locomotion Level;Stairs;Squat     Examination-Participation Restrictions Cleaning;Community Activity;Laundry;Shop    Stability/Clinical Decision Making Stable/Uncomplicated    Rehab Potential Good    PT Frequency 2x / week    PT Duration 8 weeks    PT Treatment/Interventions Therapeutic activities;Therapeutic exercise;Balance training;Neuromuscular re-education;Stair training;Functional mobility training;Gait training;Patient/family education;Manual techniques    PT Next Visit Plan balance, and functional strengthening as tolerated    PT Home Exercise Plan heel raises, sit to stand, squats and bridges; 7/20 LAQs    Recommended Other Services MD follow up for continued pain and lack of progress in balance.    Consulted and Agree with Plan of Care Patient           Patient will benefit from skilled therapeutic intervention in order to improve the following deficits and impairments:  Abnormal gait, Decreased activity tolerance, Decreased balance, Difficulty walking, Decreased strength, Pain  Visit Diagnosis: Difficulty in walking, not elsewhere classified  Repeated falls  Chronic midline low back pain without sciatica  Muscle weakness (generalized)     Problem List Patient Active Problem List   Diagnosis Date Noted  . Chronic diarrhea of unknown origin 01/14/2020  . Type 2 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, with long-term current use of insulin ([place]) 12/29/2019  . Hypothyroidism 12/29/2019  . Weakness of both lower extremities 11/12/2019  . Debility 09/10/2019  . ESRD (end stage renal disease) on dialysis ([place]) 09/10/2019  . Depression 09/10/2019  . Incontinence in female 09/10/2019  . Anemia in chronic kidney disease 08/19/2019  . Accelerated hypertension 08/08/2019  . MGUS (monoclonal gammopathy of unknown significance) 07/23/2019  . GERD (gastroesophageal reflux disease) 02/28/2019  . Essential hypertension 10/28/2018  . Irregular menses 09/14/2009  .  Hypercholesterolemia 11/19/2008  . Type 2 diabetes with nephropathy ([place]) 07/02/2007    10:41 AM, 03/01/20 Jerene Pitch, DPT Physical Therapy with [facility]  [phone] office   [facility] [address] [place], [place], [address] Phone: [phone]   Fax:  [phone]  Name: Melanie Hall MRN: [id number] Date of Birth: [birth year]

## 2020-03-03 ENCOUNTER — Telehealth: Payer: Self-pay

## 2020-03-03 ENCOUNTER — Other Ambulatory Visit: Payer: Self-pay

## 2020-03-03 ENCOUNTER — Ambulatory Visit (HOSPITAL_COMMUNITY): Payer: Medicaid Other | Admitting: Physical Therapy

## 2020-03-03 DIAGNOSIS — R296 Repeated falls: Secondary | ICD-10-CM | POA: Diagnosis not present

## 2020-03-03 DIAGNOSIS — G8929 Other chronic pain: Secondary | ICD-10-CM

## 2020-03-03 DIAGNOSIS — M6281 Muscle weakness (generalized): Secondary | ICD-10-CM | POA: Diagnosis not present

## 2020-03-03 DIAGNOSIS — R262 Difficulty in walking, not elsewhere classified: Secondary | ICD-10-CM

## 2020-03-03 DIAGNOSIS — M545 Low back pain: Secondary | ICD-10-CM | POA: Diagnosis not present

## 2020-03-03 NOTE — Telephone Encounter (Signed)
received referral from Dr. Milana Na @ [facility] requesting fistulagram d/t difficult cannulation/ clotting system -sent to be scheduled

## 2020-03-03 NOTE — Therapy (Signed)
Melanie Hall, [place], [address] Phone: [phone]   Fax:  [phone]  Physical Therapy Treatment  Patient Details  Name: Melanie Hall MRN: [id number] Date of Birth: February 08, 1971 Referring Provider (PT): Cherly Beach    Encounter Date: 03/03/2020   PT End of Session - 03/03/20 1148    Visit Number 11    Number of Visits 16    Date for PT Re-Evaluation 03/17/20    Authorization Type [employer]- approved 24 visits 01/22/20 to 03/17/20    Authorization - Visit Number 11    Authorization - Number of Visits 24    Progress Note Due on Visit 20    PT Start Time 1048    PT Stop Time 1128    PT Time Calculation (min) 40 min    Activity Tolerance Patient tolerated treatment well    Behavior During Therapy [facility] for tasks assessed/performed           Past Medical History:  Diagnosis Date  . Acute cystitis without hematuria   . Acute kidney failure ([place]) 02/28/2019  . Acute midline thoracic back pain 12/29/2019  . Acute on chronic renal failure ([place]) 02/28/2019  . Acute systolic heart failure ([place]) 10/21/2019  . Anemia   . Cataracts, bilateral    surgery to remove  . CATARACTS, BILATERAL 07/02/2007   Qualifier: Diagnosis of  By: Isla Pence    . Closed fracture of left femur ([place]) 10/28/2018  . Closed fracture of right ankle 11/06/2017  . Diabetes mellitus    type 2  . Emphysematous cystitis 08/26/2018  . Encounter for gynecological examination with Papanicolaou smear of cervix 01/14/2020  . Encounter for screening fecal occult blood testing 01/14/2020  . Encounter for screening for malignant neoplasm of cervix 12/29/2019  . Encounter for screening mammogram for malignant neoplasm of breast 12/29/2019  . ESRD on hemodialysis ([place])   . GERD (gastroesophageal reflux disease)   . Hyperlipidemia   . Hypertension   . Hypokalemia 08/26/2018  . IRREGULAR MENSES 09/14/2009   Qualifier: Diagnosis of  By: Hassell Done FNP, Tori Milks      . Loose stools 11/12/2019  . Normocytic anemia 08/26/2018  . PARONYCHIA, RIGHT GREAT TOE 07/30/2008   Qualifier: Diagnosis of  By: Hassell Done FNP, Tori Milks    . Pressure ulcer 09/10/2019  . Right arm weakness 08/08/2019  . Sprain of left ankle   . STEMI (ST elevation myocardial infarction) ([place]) 10/18/2019  . STEMI involving right coronary artery ([place]) 10/18/2019  . Stroke ([place])   . SVD (spontaneous vaginal delivery)    x 4  . Vaginosis 08/26/2018  . Weakness 09/10/2019  . Weakness of both lower extremities     Past Surgical History:  Procedure Laterality Date  . A/V FISTULAGRAM Left 01/12/2020   Procedure: A/V FISTULAGRAM;  Surgeon: Serafina Mitchell, MD;  Location: [facility];  Service: Cardiovascular;  Laterality: Left;  . AV FISTULA PLACEMENT Left 08/17/2019   Procedure: LEFT BRACHIAL CEPHALIC ARTERIOVENOUS (AV) FISTULA;  Surgeon: Angelia Mould, MD;  Location: [facility];  Service: Vascular;  Laterality: Left;  . CORONARY STENT INTERVENTION N/A 10/18/2019   Procedure: CORONARY STENT INTERVENTION;  Surgeon: Sherren Mocha, MD;  Location: [facility];  Service: Cardiovascular;  Laterality: N/A;  . CORONARY/GRAFT ACUTE MI REVASCULARIZATION N/A 10/18/2019   Procedure: Coronary/Graft Acute MI Revascularization;  Surgeon: Sherren Mocha, MD;  Location: [facility];  Service: Cardiovascular;  Laterality: N/A;  . EYE  SURGERY Bilateral    cataracts removed  . FEMUR IM NAIL Left 10/28/2018   Procedure: RETROGRADE FEMORAL NAILING;  Surgeon: Meredith Pel, MD;  Location: [facility];  Service: Orthopedics;  Laterality: Left;  . IM NAILING FEMORAL SHAFT RETROGRADE Left 10/28/2018  . INTRAVASCULAR ULTRASOUND/IVUS N/A 10/18/2019   Procedure: Intravascular Ultrasound/IVUS;  Surgeon: Sherren Mocha, MD;  Location: [facility];  Service: Cardiovascular;  Laterality: N/A;  . IR FLUORO GUIDE CV LINE RIGHT  08/11/2019  . IR THORACENTESIS ASP PLEURAL SPACE W/IMG GUIDE  11/12/2019  . IR US  GUIDE VASC ACCESS RIGHT  08/11/2019  . KNEE SURGERY Left   . LEFT HEART CATH AND CORONARY ANGIOGRAPHY N/A 10/18/2019   Procedure: LEFT HEART CATH AND CORONARY ANGIOGRAPHY;  Surgeon: Sherren Mocha, MD;  Location: [facility];  Service: Cardiovascular;  Laterality: N/A;  . PERIPHERAL VASCULAR BALLOON ANGIOPLASTY Left 01/12/2020   Procedure: PERIPHERAL VASCULAR BALLOON ANGIOPLASTY;  Surgeon: Serafina Mitchell, MD;  Location: [facility];  Service: Cardiovascular;  Laterality: Left;  AVF  . RADIOLOGY WITH ANESTHESIA N/A 09/15/2019   Procedure: [facility] AND LUMBER LOWER BACK PAIN;  Surgeon: Radiologist, Medication, MD;  Location: [facility];  Service: Radiology;  Laterality: N/A;  . TUBAL LIGATION      There were no vitals filed for this visit.   Subjective Assessment - 03/03/20 1121    Subjective minimal pain today 5/10. Pt states she is going to her her walker on payday next week as she will have to pay $100. STates she is still trying to get a return appt with MD about her back.                             [facility] Adult PT Treatment/Exercise - 03/03/20 0001      Knee/Hip Exercises: Standing   Heel Raises 20 reps    Hip Flexion 20 reps    Forward Lunges 20 reps    Forward Lunges Limitations onto 4" step with bil UE asssit    Hip Abduction 20 reps    Hip Extension 20 reps    SLS with Vectors 5 X 3" holds with bil UE assist    Other Standing Knee Exercises tandem stance 30" each LE lead with intermittent HHA      Knee/Hip Exercises: Seated   Sit to Sand 2 sets;5 reps;without UE support   with blue airex in standard chair                   PT Short Term Goals - 03/01/20 1016      PT SHORT TERM GOAL #1   Title Pt to be I in her HEP to improve her functioning level.    Baseline reports adherence to HEP    Time 4    Period Weeks    Status On-going    Target Date 02/19/20      PT SHORT TERM GOAL #2   Title PT core and LE strength increased one grade to  allow pt to be able to come sit to stand from a chair without the use of her UE>    Baseline unable    Time 4    Period Weeks    Status On-going      PT SHORT TERM GOAL #3   Title PT to be able to single leg stance on both LE for at least 20 seconds to allow her to  feel confident walking in her home with a cane.    Baseline R 30 with 1 UE support, unable to perform  on left side with UE support    Time 4    Period Weeks    Status On-going             PT Long Term Goals - 03/01/20 1027      PT LONG TERM GOAL #1   Title PT to be completing an advanced HEP to allow pt functional ability to increase.    Time 8    Period Weeks    Status On-going      PT LONG TERM GOAL #2   Title PT core and LE strength to be at least a 4+ to allow pt to squat down and pick items off the floor for housework/yardwork and to go up and down 8 steps    Time 8    Period Weeks    Status On-going      PT LONG TERM GOAL #3   Title Pt to be able to single leg stance for at least 30 seconds to allow pt to feel confident walking in her home without an assistive device.    Time 8    Period Weeks    Status On-going                 Plan - 03/03/20 1148    Clinical Impression Statement Pt with reduced LB symtpoms this session so able to resume all standing exercises and add static balance as well.  Tandem stance more challenging with Rt LE lead.  Able to add vectors as well but only able to hold 3" and required bil UE assist to complete task.  Resumed sit to stand, however unable to complete without UE unless airex riser in chair.  Pt with minimal fatique this session only requiring 2 short seated rest breaks. No change in pain at end of session.    Personal Factors and Comorbidities Past/Current Experience;Fitness;Time since onset of injury/illness/exacerbation    Examination-Activity Limitations Carry;Dressing;Lift;Locomotion Level;Stairs;Squat    Examination-Participation Restrictions  Cleaning;Community Activity;Laundry;Shop    Stability/Clinical Decision Making Stable/Uncomplicated    Rehab Potential Good    PT Frequency 2x / week    PT Duration 8 weeks    PT Treatment/Interventions Therapeutic activities;Therapeutic exercise;Balance training;Neuromuscular re-education;Stair training;Functional mobility training;Gait training;Patient/family education;Manual techniques    PT Next Visit Plan balance, and functional strengthening as tolerated    PT Home Exercise Plan heel raises, sit to stand, squats and bridges; 7/20 LAQs    Consulted and Agree with Plan of Care Patient           Patient will benefit from skilled therapeutic intervention in order to improve the following deficits and impairments:  Abnormal gait, Decreased activity tolerance, Decreased balance, Difficulty walking, Decreased strength, Pain  Visit Diagnosis: No diagnosis found.     Problem List Patient Active Problem List   Diagnosis Date Noted  . Chronic diarrhea of unknown origin 01/14/2020  . Type 2 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, with long-term current use of insulin ([place]) 12/29/2019  . Hypothyroidism 12/29/2019  . Weakness of both lower extremities 11/12/2019  . Debility 09/10/2019  . ESRD (end stage renal disease) on dialysis ([place]) 09/10/2019  . Depression 09/10/2019  . Incontinence in female 09/10/2019  . Anemia in chronic kidney disease 08/19/2019  . Accelerated hypertension 08/08/2019  . MGUS (monoclonal gammopathy of unknown significance) 07/23/2019  . GERD (gastroesophageal  reflux disease) 02/28/2019  . Essential hypertension 10/28/2018  . Irregular menses 09/14/2009  . Hypercholesterolemia 11/19/2008  . Type 2 diabetes with nephropathy ([place]) 07/02/2007   Teena Irani, PTA/CLT [phone]  Teena Irani 03/03/2020, 11:49 AM  [facility] [address] [place], [place], [address] Phone:  [phone]   Fax:  [phone]  Name: FIORA WEILL MRN: [id number] Date of Birth: 1971-02-16

## 2020-03-07 ENCOUNTER — Other Ambulatory Visit: Payer: Self-pay

## 2020-03-07 MED ORDER — SODIUM CHLORIDE 0.9 % IV SOLN
250.0000 mL | INTRAVENOUS | Status: AC | PRN
Start: 2020-03-07 — End: ?

## 2020-03-08 ENCOUNTER — Ambulatory Visit (HOSPITAL_COMMUNITY): Payer: Medicaid Other | Admitting: Physical Therapy

## 2020-03-08 ENCOUNTER — Other Ambulatory Visit: Payer: Self-pay

## 2020-03-08 DIAGNOSIS — G8929 Other chronic pain: Secondary | ICD-10-CM | POA: Diagnosis not present

## 2020-03-08 DIAGNOSIS — N186 End stage renal disease: Secondary | ICD-10-CM | POA: Diagnosis not present

## 2020-03-08 DIAGNOSIS — Z992 Dependence on renal dialysis: Secondary | ICD-10-CM | POA: Diagnosis not present

## 2020-03-08 DIAGNOSIS — R296 Repeated falls: Secondary | ICD-10-CM | POA: Diagnosis not present

## 2020-03-08 DIAGNOSIS — M545 Low back pain: Secondary | ICD-10-CM | POA: Diagnosis not present

## 2020-03-08 DIAGNOSIS — M6281 Muscle weakness (generalized): Secondary | ICD-10-CM | POA: Diagnosis not present

## 2020-03-08 DIAGNOSIS — R262 Difficulty in walking, not elsewhere classified: Secondary | ICD-10-CM

## 2020-03-08 NOTE — Therapy (Signed)
[place]anie [place], [place], [address] Phone: [phone]   Fax:  [phone]  Physical Therapy Treatment  Patient Details  Name: Melanie Hall MRN: [id number] Date of Birth: 1971-07-07 Referring Provider (PT): Cherly Beach    Encounter Date: 03/08/2020   PT End of Session - 03/08/20 1341    Visit Number 12    Number of Visits 16    Date for PT Re-Evaluation 03/17/20    Authorization Type [employer]- approved 24 visits 01/22/20 to 03/17/20    Authorization - Visit Number 12    Authorization - Number of Visits 24    Progress Note Due on Visit 20    PT Start Time 1318    PT Stop Time 1400    PT Time Calculation (min) 42 min    Activity Tolerance Patient tolerated treatment well    Behavior During Therapy [facility] for tasks assessed/performed           Past Medical History:  Diagnosis Date  . Acute cystitis without hematuria   . Acute kidney failure ([place]) 02/28/2019  . Acute midline thoracic back pain 12/29/2019  . Acute on chronic renal failure ([place]) 02/28/2019  . Acute systolic heart failure ([place]) 10/21/2019  . Anemia   . Cataracts, bilateral    surgery to remove  . CATARACTS, BILATERAL 07/02/2007   Qualifier: Diagnosis of  By: Isla Pence    . Closed fracture of left femur ([place]) 10/28/2018  . Closed fracture of right ankle 11/06/2017  . Diabetes mellitus    type 2  . Emphysematous cystitis 08/26/2018  . Encounter for gynecological examination with Papanicolaou smear of cervix 01/14/2020  . Encounter for screening fecal occult blood testing 01/14/2020  . Encounter for screening for malignant neoplasm of cervix 12/29/2019  . Encounter for screening mammogram for malignant neoplasm of breast 12/29/2019  . ESRD on hemodialysis ([place])   . GERD (gastroesophageal reflux disease)   . Hyperlipidemia   . Hypertension   . Hypokalemia 08/26/2018  . IRREGULAR MENSES 09/14/2009   Qualifier: Diagnosis of  By: Hassell Done FNP, Tori Milks      . Loose stools 11/12/2019  . Normocytic anemia 08/26/2018  . PARONYCHIA, RIGHT GREAT TOE 07/30/2008   Qualifier: Diagnosis of  By: Hassell Done FNP, Tori Milks    . Pressure ulcer 09/10/2019  . Right arm weakness 08/08/2019  . Sprain of left ankle   . STEMI (ST elevation myocardial infarction) ([place]) 10/18/2019  . STEMI involving right coronary artery ([place]) 10/18/2019  . Stroke ([place])   . SVD (spontaneous vaginal delivery)    x 4  . Vaginosis 08/26/2018  . Weakness 09/10/2019  . Weakness of both lower extremities     Past Surgical History:  Procedure Laterality Date  . A/V FISTULAGRAM Left 01/12/2020   Procedure: A/V FISTULAGRAM;  Surgeon: Serafina Mitchell, MD;  Location: [place] [facility];  Service: Cardiovascular;  Laterality: Left;  . AV FISTULA PLACEMENT Left 08/17/2019   Procedure: LEFT BRACHIAL CEPHALIC ARTERIOVENOUS (AV) FISTULA;  Surgeon: Angelia Mould, MD;  Location: [place];  Service: Vascular;  Laterality: Left;  . CORONARY STENT INTERVENTION N/A 10/18/2019   Procedure: CORONARY STENT INTERVENTION;  Surgeon: Sherren Mocha, MD;  Location: [place] [facility];  Service: Cardiovascular;  Laterality: N/A;  . CORONARY/GRAFT ACUTE MI REVASCULARIZATION N/A 10/18/2019   Procedure: Coronary/Graft Acute MI Revascularization;  Surgeon: Sherren Mocha, MD;  Location: [place] [facility];  Service: Cardiovascular;  Laterality: N/A;  . EYE  SURGERY Bilateral    cataracts removed  . FEMUR IM NAIL Left 10/28/2018   Procedure: RETROGRADE FEMORAL NAILING;  Surgeon: Meredith Pel, MD;  Location: [place];  Service: Orthopedics;  Laterality: Left;  . IM NAILING FEMORAL SHAFT RETROGRADE Left 10/28/2018  . INTRAVASCULAR ULTRASOUND/IVUS N/A 10/18/2019   Procedure: Intravascular Ultrasound/IVUS;  Surgeon: Sherren Mocha, MD;  Location: [place] [facility];  Service: Cardiovascular;  Laterality: N/A;  . IR FLUORO GUIDE CV LINE RIGHT  08/11/2019  . IR THORACENTESIS ASP PLEURAL SPACE W/IMG GUIDE  11/12/2019  . IR US  GUIDE VASC ACCESS RIGHT  08/11/2019  . KNEE SURGERY Left   . LEFT HEART CATH AND CORONARY ANGIOGRAPHY N/A 10/18/2019   Procedure: LEFT HEART CATH AND CORONARY ANGIOGRAPHY;  Surgeon: Sherren Mocha, MD;  Location: [place] [facility];  Service: Cardiovascular;  Laterality: N/A;  . PERIPHERAL VASCULAR BALLOON ANGIOPLASTY Left 01/12/2020   Procedure: PERIPHERAL VASCULAR BALLOON ANGIOPLASTY;  Surgeon: Serafina Mitchell, MD;  Location: [place] [facility];  Service: Cardiovascular;  Laterality: Left;  AVF  . RADIOLOGY WITH ANESTHESIA N/A 09/15/2019   Procedure: [facility] AND LUMBER LOWER BACK PAIN;  Surgeon: Radiologist, Medication, MD;  Location: [place];  Service: Radiology;  Laterality: N/A;  . TUBAL LIGATION      There were no vitals filed for this visit.   Subjective Assessment - 03/08/20 1322    Subjective Pt states states she is doing her HEP "as much as i can".  Reports 4/10 LBP and still has not heard back from MD regarding appointment for back.  Is to get her new RW this Thursday.                             [facility] Adult PT Treatment/Exercise - 03/08/20 0001      Knee/Hip Exercises: Standing   Heel Raises 20 reps    Hip Flexion 20 reps    Forward Lunges 20 reps    Forward Lunges Limitations onto 2" step with bil UE asssit    Hip Abduction 20 reps    Hip Extension 20 reps    Lateral Step Up Step Height: 4";10 reps;Hand Hold: 2    SLS with Vectors 2 sets 5 X 3" holds with bil UE assist      Knee/Hip Exercises: Seated   Sit to Sand 2 sets;10 reps;without UE support   with blue airex in chair                   PT Short Term Goals - 03/01/20 1016      PT SHORT TERM GOAL #1   Title Pt to be I in her HEP to improve her functioning level.    Baseline reports adherence to HEP    Time 4    Period Weeks    Status On-going    Target Date 02/19/20      PT SHORT TERM GOAL #2   Title PT core and LE strength increased one grade to allow pt to be able to come sit to  stand from a chair without the use of her UE>    Baseline unable    Time 4    Period Weeks    Status On-going      PT SHORT TERM GOAL #3   Title PT to be able to single leg stance on both LE for at least 20 seconds to allow her to feel confident walking in  her home with a cane.    Baseline R 30 with 1 UE support, unable to perform  on left side with UE support    Time 4    Period Weeks    Status On-going             PT Long Term Goals - 03/01/20 1027      PT LONG TERM GOAL #1   Title PT to be completing an advanced HEP to allow pt functional ability to increase.    Time 8    Period Weeks    Status On-going      PT LONG TERM GOAL #2   Title PT core and LE strength to be at least a 4+ to allow pt to squat down and pick items off the floor for housework/yardwork and to go up and down 8 steps    Time 8    Period Weeks    Status On-going      PT LONG TERM GOAL #3   Title Pt to be able to single leg stance for at least 30 seconds to allow pt to feel confident walking in her home without an assistive device.    Time 8    Period Weeks    Status On-going                 Plan - 03/08/20 1358    Clinical Impression Statement LBP remains low today, however with some issues regarding her diaylisis port on Lt UE and is scheduled to have surgery for this next week.  Able to increase sets/reps of sit to stands and vectors today  Pt began c/o mid to Rt chest pain towards end of session but reports it happens sometimes and is not unusual.  No other issues during or after session today.    Personal Factors and Comorbidities Past/Current Experience;Fitness;Time since onset of injury/illness/exacerbation    Examination-Activity Limitations Carry;Dressing;Lift;Locomotion Level;Stairs;Squat    Examination-Participation Restrictions Cleaning;Community Activity;Laundry;Shop    Stability/Clinical Decision Making Stable/Uncomplicated    Rehab Potential Good    PT Frequency 2x / week    PT  Duration 8 weeks    PT Treatment/Interventions Therapeutic activities;Therapeutic exercise;Balance training;Neuromuscular re-education;Stair training;Functional mobility training;Gait training;Patient/family education;Manual techniques    PT Next Visit Plan balance, and functional strengthening as tolerated.  Continue X 2-3 more sessions then re-evaluate.    PT Home Exercise Plan heel raises, sit to stand, squats and bridges; 7/20 LAQs    Consulted and Agree with Plan of Care Patient           Patient will benefit from skilled therapeutic intervention in order to improve the following deficits and impairments:  Abnormal gait, Decreased activity tolerance, Decreased balance, Difficulty walking, Decreased strength, Pain  Visit Diagnosis: Difficulty in walking, not elsewhere classified  Repeated falls  Chronic midline low back pain without sciatica  Muscle weakness (generalized)     Problem List Patient Active Problem List   Diagnosis Date Noted  . Chronic diarrhea of unknown origin 01/14/2020  . Type 2 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, with long-term current use of insulin ([place]) 12/29/2019  . Hypothyroidism 12/29/2019  . Weakness of both lower extremities 11/12/2019  . Debility 09/10/2019  . ESRD (end stage renal disease) on dialysis ([place]) 09/10/2019  . Depression 09/10/2019  . Incontinence in female 09/10/2019  . Anemia in chronic kidney disease 08/19/2019  . Accelerated hypertension 08/08/2019  . MGUS (monoclonal gammopathy of unknown significance) 07/23/2019  .  GERD (gastroesophageal reflux disease) 02/28/2019  . Essential hypertension 10/28/2018  . Irregular menses 09/14/2009  . Hypercholesterolemia 11/19/2008  . Type 2 diabetes with nephropathy ([place]) 07/02/2007   Teena Irani, PTA/CLT [phone]  Teena Irani 03/08/2020, 2:43 PM  [facility] [address] [place], [place],  [address] Phone: [phone]   Fax:  [phone]  Name: Melanie Hall MRN: [id number] Date of Birth: 10/08/1970

## 2020-03-10 ENCOUNTER — Ambulatory Visit (HOSPITAL_COMMUNITY): Payer: Medicaid Other | Attending: Family Medicine | Admitting: Physical Therapy

## 2020-03-10 ENCOUNTER — Other Ambulatory Visit: Payer: Self-pay

## 2020-03-10 DIAGNOSIS — M6281 Muscle weakness (generalized): Secondary | ICD-10-CM | POA: Diagnosis not present

## 2020-03-10 DIAGNOSIS — M545 Low back pain: Secondary | ICD-10-CM | POA: Insufficient documentation

## 2020-03-10 DIAGNOSIS — R296 Repeated falls: Secondary | ICD-10-CM | POA: Insufficient documentation

## 2020-03-10 DIAGNOSIS — G8929 Other chronic pain: Secondary | ICD-10-CM | POA: Insufficient documentation

## 2020-03-10 DIAGNOSIS — R262 Difficulty in walking, not elsewhere classified: Secondary | ICD-10-CM

## 2020-03-10 NOTE — Therapy (Signed)
[facility] [place], [place], [address] Phone: [phone]   Fax:  [phone]  Physical Therapy Treatment  Patient Details  Name: Melanie Hall MRN: [id number] Date of Birth: 26-May-1971 Referring Provider (PT): Cherly Beach    Encounter Date: 03/10/2020   PT End of Session - 03/10/20 1444    Visit Number 13    Number of Visits 16    Date for PT Re-Evaluation 03/17/20    Authorization Type [employer]- approved 24 visits 01/22/20 to 03/17/20    Authorization - Visit Number 13    Authorization - Number of Visits 24    Progress Note Due on Visit 20    PT Start Time 1320    PT Stop Time 1410    PT Time Calculation (min) 50 min    Activity Tolerance Patient tolerated treatment well    Behavior During Therapy [facility] for tasks assessed/performed           Past Medical History:  Diagnosis Date  . Acute cystitis without hematuria   . Acute kidney failure ([place]) 02/28/2019  . Acute midline thoracic back pain 12/29/2019  . Acute on chronic renal failure ([place]) 02/28/2019  . Acute systolic heart failure ([place]) 10/21/2019  . Anemia   . Cataracts, bilateral    surgery to remove  . CATARACTS, BILATERAL 07/02/2007   Qualifier: Diagnosis of  By: Isla Pence    . Closed fracture of left femur ([place]) 10/28/2018  . Closed fracture of right ankle 11/06/2017  . Diabetes mellitus    type 2  . Emphysematous cystitis 08/26/2018  . Encounter for gynecological examination with Papanicolaou smear of cervix 01/14/2020  . Encounter for screening fecal occult blood testing 01/14/2020  . Encounter for screening for malignant neoplasm of cervix 12/29/2019  . Encounter for screening mammogram for malignant neoplasm of breast 12/29/2019  . ESRD on hemodialysis ([place])   . GERD (gastroesophageal reflux disease)   . Hyperlipidemia   . Hypertension   . Hypokalemia 08/26/2018  . IRREGULAR MENSES 09/14/2009   Qualifier: Diagnosis of  By: Hassell Done FNP, Tori Milks    .  Loose stools 11/12/2019  . Normocytic anemia 08/26/2018  . PARONYCHIA, RIGHT GREAT TOE 07/30/2008   Qualifier: Diagnosis of  By: Hassell Done FNP, Tori Milks    . Pressure ulcer 09/10/2019  . Right arm weakness 08/08/2019  . Sprain of left ankle   . STEMI (ST elevation myocardial infarction) ([place]) 10/18/2019  . STEMI involving right coronary artery ([place]) 10/18/2019  . Stroke ([place])   . SVD (spontaneous vaginal delivery)    x 4  . Vaginosis 08/26/2018  . Weakness 09/10/2019  . Weakness of both lower extremities     Past Surgical History:  Procedure Laterality Date  . A/V FISTULAGRAM Left 01/12/2020   Procedure: A/V FISTULAGRAM;  Surgeon: Serafina Mitchell, MD;  Location: [place] [facility];  Service: Cardiovascular;  Laterality: Left;  . AV FISTULA PLACEMENT Left 08/17/2019   Procedure: LEFT BRACHIAL CEPHALIC ARTERIOVENOUS (AV) FISTULA;  Surgeon: Angelia Mould, MD;  Location: [facility];  Service: Vascular;  Laterality: Left;  . CORONARY STENT INTERVENTION N/A 10/18/2019   Procedure: CORONARY STENT INTERVENTION;  Surgeon: Sherren Mocha, MD;  Location: [place] [facility];  Service: Cardiovascular;  Laterality: N/A;  . CORONARY/GRAFT ACUTE MI REVASCULARIZATION N/A 10/18/2019   Procedure: Coronary/Graft Acute MI Revascularization;  Surgeon: Sherren Mocha, MD;  Location: [place] [facility];  Service: Cardiovascular;  Laterality: N/A;  . EYE SURGERY  Bilateral    cataracts removed  . FEMUR IM NAIL Left 10/28/2018   Procedure: RETROGRADE FEMORAL NAILING;  Surgeon: Meredith Pel, MD;  Location: [place];  Service: Orthopedics;  Laterality: Left;  . IM NAILING FEMORAL SHAFT RETROGRADE Left 10/28/2018  . INTRAVASCULAR ULTRASOUND/IVUS N/A 10/18/2019   Procedure: Intravascular Ultrasound/IVUS;  Surgeon: Sherren Mocha, MD;  Location: [place] [facility];  Service: Cardiovascular;  Laterality: N/A;  . IR FLUORO GUIDE CV LINE RIGHT  08/11/2019  . IR THORACENTESIS ASP PLEURAL SPACE W/IMG GUIDE  11/12/2019  . IR US  GUIDE VASC ACCESS RIGHT  08/11/2019  . KNEE SURGERY Left   . LEFT HEART CATH AND CORONARY ANGIOGRAPHY N/A 10/18/2019   Procedure: LEFT HEART CATH AND CORONARY ANGIOGRAPHY;  Surgeon: Sherren Mocha, MD;  Location: [place] [facility];  Service: Cardiovascular;  Laterality: N/A;  . PERIPHERAL VASCULAR BALLOON ANGIOPLASTY Left 01/12/2020   Procedure: PERIPHERAL VASCULAR BALLOON ANGIOPLASTY;  Surgeon: Serafina Mitchell, MD;  Location: [place] [facility];  Service: Cardiovascular;  Laterality: Left;  AVF  . RADIOLOGY WITH ANESTHESIA N/A 09/15/2019   Procedure: [facility] AND LUMBER LOWER BACK PAIN;  Surgeon: Radiologist, Medication, MD;  Location: [place];  Service: Radiology;  Laterality: N/A;  . TUBAL LIGATION      There were no vitals filed for this visit.   Subjective Assessment - 03/10/20 1324    Subjective pt reports soreness today but no pain.   STates she will get her walker over the weekend.  STates she will not be here all next week due to having a covid test and quarantining until her surgery on Thursday for her dialysis port.    Currently in Pain? No/denies                             [facility] Adult PT Treatment/Exercise - 03/10/20 0001      Knee/Hip Exercises: Standing   Hip Flexion 20 reps    Forward Lunges 20 reps    Forward Lunges Limitations onto 2" step with bil UE asssit    Hip Abduction 20 reps    Hip Extension 20 reps    Lateral Step Up Step Height: 4";10 reps;Hand Hold: 2    Lateral Step Up Limitations eccentric lowering    Forward Step Up Both;10 reps;Step Height: 4";Step Height: 2"    Forward Step Up Limitations with power up of opposite LE    SLS with Vectors 2 sets 5 X 3" holds with bil UE assist                    PT Short Term Goals - 03/01/20 1016      PT SHORT TERM GOAL #1   Title Pt to be I in her HEP to improve her functioning level.    Baseline reports adherence to HEP    Time 4    Period Weeks    Status On-going    Target Date  02/19/20      PT SHORT TERM GOAL #2   Title PT core and LE strength increased one grade to allow pt to be able to come sit to stand from a chair without the use of her UE>    Baseline unable    Time 4    Period Weeks    Status On-going      PT SHORT TERM GOAL #3   Title PT to be able to single leg stance  on both LE for at least 20 seconds to allow her to feel confident walking in her home with a cane.    Baseline R 30 with 1 UE support, unable to perform  on left side with UE support    Time 4    Period Weeks    Status On-going             PT Long Term Goals - 03/01/20 1027      PT LONG TERM GOAL #1   Title PT to be completing an advanced HEP to allow pt functional ability to increase.    Time 8    Period Weeks    Status On-going      PT LONG TERM GOAL #2   Title PT core and LE strength to be at least a 4+ to allow pt to squat down and pick items off the floor for housework/yardwork and to go up and down 8 steps    Time 8    Period Weeks    Status On-going      PT LONG TERM GOAL #3   Title Pt to be able to single leg stance for at least 30 seconds to allow pt to feel confident walking in her home without an assistive device.    Time 8    Period Weeks    Status On-going                 Plan - 03/10/20 1450    Clinical Impression Statement continued with LE strengthening and stabilization. Attempted to reduce UE assist, however pt relies heavily on UE's with WB actvitiies.  Pt did not complain of any LB pain today during or after session.  Pt informed therapist at [facility] that she would not be here next weekend and unable to get an appt the next week due to no availability.  Pt will need a reassessment upon return.    Personal Factors and Comorbidities Past/Current Experience;Fitness;Time since onset of injury/illness/exacerbation    Examination-Activity Limitations Carry;Dressing;Lift;Locomotion Level;Stairs;Squat    Examination-Participation Restrictions  Cleaning;Community Activity;Laundry;Shop    Stability/Clinical Decision Making Stable/Uncomplicated    Rehab Potential Good    PT Frequency 2x / week    PT Duration 8 weeks    PT Treatment/Interventions Therapeutic activities;Therapeutic exercise;Balance training;Neuromuscular re-education;Stair training;Functional mobility training;Gait training;Patient/family education;Manual techniques    PT Next Visit Plan balance, and functional strengthening as tolerated.  re-evaluate.    PT Home Exercise Plan heel raises, sit to stand, squats and bridges; 7/20 LAQs    Consulted and Agree with Plan of Care Patient           Patient will benefit from skilled therapeutic intervention in order to improve the following deficits and impairments:  Abnormal gait, Decreased activity tolerance, Decreased balance, Difficulty walking, Decreased strength, Pain  Visit Diagnosis: Difficulty in walking, not elsewhere classified  Repeated falls  Chronic midline low back pain without sciatica  Muscle weakness (generalized)     Problem List Patient Active Problem List   Diagnosis Date Noted  . Chronic diarrhea of unknown origin 01/14/2020  . 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 Hypothyroidism 12/29/2019  . Weakness of both lower extremities 11/12/2019  . Debility 09/10/2019  . ESRD (end stage renal disease) on dialysis ([place]) 09/10/2019  . Depression 09/10/2019  . Incontinence in female 09/10/2019  . Anemia in chronic kidney disease 08/19/2019  . Accelerated hypertension 08/08/2019  .  MGUS (monoclonal gammopathy of unknown significance) 07/23/2019  . GERD (gastroesophageal reflux disease) 02/28/2019  . Essential hypertension 10/28/2018  . Irregular menses 09/14/2009  . Hypercholesterolemia 11/19/2008  . Type 2 diabetes with nephropathy ([place]) 07/02/2007   Teena Irani, PTA/CLT [phone]  Teena Irani 03/10/2020, 2:53 PM  [facility] [address] [place], [place], [address] Phone: [phone]   Fax:  [phone]  Name: LINDSAY STRAKA MRN: [id number] Date of Birth: 1971/06/14

## 2020-03-15 ENCOUNTER — Encounter (HOSPITAL_COMMUNITY): Payer: Medicaid Other | Admitting: Physical Therapy

## 2020-03-15 ENCOUNTER — Other Ambulatory Visit (HOSPITAL_COMMUNITY)
Admission: RE | Admit: 2020-03-15 | Discharge: 2020-03-15 | Disposition: A | Discharge status: Home / Self Care | Payer: Medicaid Other | Source: Ambulatory Visit | Attending: Vascular Surgery | Admitting: Vascular Surgery

## 2020-03-15 DIAGNOSIS — Z20822 Contact with and (suspected) exposure to covid-19: Secondary | ICD-10-CM | POA: Insufficient documentation

## 2020-03-15 DIAGNOSIS — Z01812 Encounter for preprocedural laboratory examination: Secondary | ICD-10-CM | POA: Insufficient documentation

## 2020-03-15 LAB — SARS CORONAVIRUS 2 (TAT 6-24 HRS): SARS Coronavirus 2: NEGATIVE

## 2020-03-17 ENCOUNTER — Encounter (HOSPITAL_COMMUNITY): Payer: Medicaid Other | Admitting: Physical Therapy

## 2020-03-17 ENCOUNTER — Ambulatory Visit (HOSPITAL_COMMUNITY)
Admission: RE | Admit: 2020-03-17 | Discharge: 2020-03-17 | Disposition: A | Discharge status: Home / Self Care | Payer: Medicaid Other | Attending: Vascular Surgery | Admitting: Vascular Surgery

## 2020-03-17 ENCOUNTER — Encounter (HOSPITAL_COMMUNITY)
Admission: RE | Disposition: A | Discharge status: Home / Self Care | Payer: Self-pay | Source: Home / Self Care | Attending: Vascular Surgery

## 2020-03-17 DIAGNOSIS — Z794 Long term (current) use of insulin: Secondary | ICD-10-CM | POA: Insufficient documentation

## 2020-03-17 DIAGNOSIS — Z8249 Family history of ischemic heart disease and other diseases of the circulatory system: Secondary | ICD-10-CM | POA: Insufficient documentation

## 2020-03-17 DIAGNOSIS — Z95828 Presence of other vascular implants and grafts: Secondary | ICD-10-CM | POA: Diagnosis not present

## 2020-03-17 DIAGNOSIS — N179 Acute kidney failure, unspecified: Secondary | ICD-10-CM | POA: Insufficient documentation

## 2020-03-17 DIAGNOSIS — Z880 Allergy status to penicillin: Secondary | ICD-10-CM | POA: Insufficient documentation

## 2020-03-17 DIAGNOSIS — I252 Old myocardial infarction: Secondary | ICD-10-CM | POA: Insufficient documentation

## 2020-03-17 DIAGNOSIS — I5021 Acute systolic (congestive) heart failure: Secondary | ICD-10-CM | POA: Insufficient documentation

## 2020-03-17 DIAGNOSIS — E785 Hyperlipidemia, unspecified: Secondary | ICD-10-CM | POA: Diagnosis not present

## 2020-03-17 DIAGNOSIS — E1136 Type 2 diabetes mellitus with diabetic cataract: Secondary | ICD-10-CM | POA: Insufficient documentation

## 2020-03-17 DIAGNOSIS — Z955 Presence of coronary angioplasty implant and graft: Secondary | ICD-10-CM | POA: Insufficient documentation

## 2020-03-17 DIAGNOSIS — Z8673 Personal history of transient ischemic attack (TIA), and cerebral infarction without residual deficits: Secondary | ICD-10-CM | POA: Insufficient documentation

## 2020-03-17 DIAGNOSIS — Z87891 Personal history of nicotine dependence: Secondary | ICD-10-CM | POA: Insufficient documentation

## 2020-03-17 DIAGNOSIS — I132 Hypertensive heart and chronic kidney disease with heart failure and with stage 5 chronic kidney disease, or end stage renal disease: Secondary | ICD-10-CM | POA: Diagnosis not present

## 2020-03-17 DIAGNOSIS — T82510A Breakdown (mechanical) of surgically created arteriovenous fistula, initial encounter: Secondary | ICD-10-CM | POA: Diagnosis present

## 2020-03-17 DIAGNOSIS — Z833 Family history of diabetes mellitus: Secondary | ICD-10-CM | POA: Insufficient documentation

## 2020-03-17 DIAGNOSIS — N186 End stage renal disease: Secondary | ICD-10-CM | POA: Insufficient documentation

## 2020-03-17 DIAGNOSIS — Z7982 Long term (current) use of aspirin: Secondary | ICD-10-CM | POA: Diagnosis not present

## 2020-03-17 DIAGNOSIS — Y832 Surgical operation with anastomosis, bypass or graft as the cause of abnormal reaction of the patient, or of later complication, without mention of misadventure at the time of the procedure: Secondary | ICD-10-CM | POA: Diagnosis not present

## 2020-03-17 DIAGNOSIS — T82898A Other specified complication of vascular prosthetic devices, implants and grafts, initial encounter: Secondary | ICD-10-CM | POA: Diagnosis not present

## 2020-03-17 DIAGNOSIS — Z79899 Other long term (current) drug therapy: Secondary | ICD-10-CM | POA: Diagnosis not present

## 2020-03-17 DIAGNOSIS — Z79890 Hormone replacement therapy (postmenopausal): Secondary | ICD-10-CM | POA: Diagnosis not present

## 2020-03-17 DIAGNOSIS — E1122 Type 2 diabetes mellitus with diabetic chronic kidney disease: Secondary | ICD-10-CM | POA: Diagnosis not present

## 2020-03-17 DIAGNOSIS — Z992 Dependence on renal dialysis: Secondary | ICD-10-CM | POA: Insufficient documentation

## 2020-03-17 HISTORY — PX: A/V FISTULAGRAM: CATH118298

## 2020-03-17 HISTORY — PX: PERIPHERAL VASCULAR BALLOON ANGIOPLASTY: CATH118281

## 2020-03-17 LAB — POCT I-STAT, CHEM 8
BUN: 17 mg/dL (ref 6–20)
Calcium, Ion: 1.09 mmol/L — ABNORMAL LOW (ref 1.15–1.40)
Chloride: 96 mmol/L — ABNORMAL LOW (ref 98–111)
Creatinine, Ser: 3.1 mg/dL — ABNORMAL HIGH (ref 0.44–1.00)
Glucose, Bld: 240 mg/dL — ABNORMAL HIGH (ref 70–99)
HCT: 36 % (ref 36.0–46.0)
Hemoglobin: 12.2 g/dL (ref 12.0–15.0)
Potassium: 3.3 mmol/L — ABNORMAL LOW (ref 3.5–5.1)
Sodium: 136 mmol/L (ref 135–145)
TCO2: 26 mmol/L (ref 22–32)

## 2020-03-17 SURGERY — A/V FISTULAGRAM
Anesthesia: LOCAL

## 2020-03-17 MED ORDER — LIDOCAINE HCL (PF) 1 % IJ SOLN
INTRAMUSCULAR | Status: AC
  Filled 2020-03-17: qty 30

## 2020-03-17 MED ORDER — IODIXANOL 320 MG/ML IV SOLN
INTRAVENOUS | Status: DC | PRN
  Administered 2020-03-17: 40 mL

## 2020-03-17 MED ORDER — SODIUM CHLORIDE 0.9% FLUSH: 3.0000 mL | Freq: Two times a day (BID) | INTRAVENOUS | Status: DC

## 2020-03-17 MED ORDER — HEPARIN (PORCINE) IN NACL 1000-0.9 UT/500ML-% IV SOLN
INTRAVENOUS | Status: AC
  Filled 2020-03-17: qty 500

## 2020-03-17 MED ORDER — LIDOCAINE HCL (PF) 1 % IJ SOLN
INTRAMUSCULAR | Status: DC | PRN
  Administered 2020-03-17: 2 mL via INTRADERMAL

## 2020-03-17 MED ORDER — HEPARIN SODIUM (PORCINE) 1000 UNIT/ML IJ SOLN
INTRAMUSCULAR | Status: DC | PRN
  Administered 2020-03-17: 3000 [IU] via INTRAVENOUS

## 2020-03-17 MED ORDER — ONDANSETRON HCL 4 MG/2ML IJ SOLN
INTRAMUSCULAR | Status: DC | PRN
  Administered 2020-03-17: 4 mg via INTRAVENOUS

## 2020-03-17 MED ORDER — DIPHENHYDRAMINE HCL 50 MG/ML IJ SOLN
INTRAMUSCULAR | Status: AC
  Filled 2020-03-17: qty 1

## 2020-03-17 MED ORDER — SODIUM CHLORIDE 0.9% FLUSH: 3.0000 mL | INTRAVENOUS | Status: DC | PRN

## 2020-03-17 MED ORDER — DIPHENHYDRAMINE HCL 50 MG/ML IJ SOLN
INTRAMUSCULAR | Status: DC | PRN
  Administered 2020-03-17: 50 mg via INTRAVENOUS

## 2020-03-17 MED ORDER — ONDANSETRON HCL 4 MG/2ML IJ SOLN
INTRAMUSCULAR | Status: AC
  Filled 2020-03-17: qty 2

## 2020-03-17 MED ORDER — METHYLPREDNISOLONE SODIUM SUCC 125 MG IJ SOLR
INTRAMUSCULAR | Status: AC
  Filled 2020-03-17: qty 2

## 2020-03-17 MED ORDER — METHYLPREDNISOLONE SODIUM SUCC 125 MG IJ SOLR
INTRAMUSCULAR | Status: DC | PRN
  Administered 2020-03-17: 125 mg via INTRAVENOUS

## 2020-03-17 SURGICAL SUPPLY — 16 items
BAG SNAP BAND KOVER 36X36 (MISCELLANEOUS) ×3 IMPLANT
BALLN LUTONIX AV 8X60X75 (BALLOONS) ×3
BALLN MUSTANG 7X60X75 (BALLOONS) ×3
BALLOON LUTONIX AV 8X60X75 (BALLOONS) ×2 IMPLANT
BALLOON MUSTANG 7X60X75 (BALLOONS) ×2 IMPLANT
COVER DOME SNAP 22 D (MISCELLANEOUS) ×3 IMPLANT
KIT ENCORE 26 ADVANTAGE (KITS) ×3 IMPLANT
KIT MICROPUNCTURE NIT STIFF (SHEATH) ×3 IMPLANT
PROTECTION STATION PRESSURIZED (MISCELLANEOUS) ×3
SHEATH PINNACLE R/O II 6F 4CM (SHEATH) ×3 IMPLANT
SHEATH PROBE COVER 6X72 (BAG) ×3 IMPLANT
STATION PROTECTION PRESSURIZED (MISCELLANEOUS) ×2 IMPLANT
STOPCOCK MORSE 400PSI 3WAY (MISCELLANEOUS) ×3 IMPLANT
TRAY PV CATH (CUSTOM PROCEDURE TRAY) ×3 IMPLANT
TUBING CIL FLEX 10 FLL-RA (TUBING) ×3 IMPLANT
WIRE BENTSON .035X145CM (WIRE) ×3 IMPLANT

## 2020-03-17 NOTE — H&P (Signed)
H&P    Reason for Consult:  Malfunction left arm AVF MRN #:  [id number]  History of Present Illness: This is a 49 y.o. female with ESRD who presents with malfunction left arm AVF.  She had a left brachiocephalic AV fistula placed on 08/17/2019.  When she 1st began using this she had infiltration.  Of note she dialyzes on Monday Wednesdays and Fridays and has a functioning dialysis catheter.  Previously underwent fistulogram with Dr. Trula Slade on 01/12/20.  Left cephalic vein was angioplastied with 7 mm Ranger.  She states still not working.  Good thrill.   Past Medical History:  Diagnosis Date  . Acute cystitis without hematuria   . Acute kidney failure ([place]) 02/28/2019  . Acute midline thoracic back pain 12/29/2019  . Acute on chronic renal failure ([place]) 02/28/2019  . Acute systolic heart failure ([place]) 10/21/2019  . Anemia   . Cataracts, bilateral    surgery to remove  . CATARACTS, BILATERAL 07/02/2007   Qualifier: Diagnosis of  By: Isla Pence    . Closed fracture of left femur ([place]) 10/28/2018  . Closed fracture of right ankle 11/06/2017  . Diabetes mellitus    type 2  . Emphysematous cystitis 08/26/2018  . Encounter for gynecological examination with Papanicolaou smear of cervix 01/14/2020  . Encounter for screening fecal occult blood testing 01/14/2020  . Encounter for screening for malignant neoplasm of cervix 12/29/2019  . Encounter for screening mammogram for malignant neoplasm of breast 12/29/2019  . ESRD on hemodialysis ([place])   . GERD (gastroesophageal reflux disease)   . Hyperlipidemia   . Hypertension   . Hypokalemia 08/26/2018  . IRREGULAR MENSES 09/14/2009   Qualifier: Diagnosis of  By: Hassell Done FNP, Tori Milks    . Loose stools 11/12/2019  . Normocytic anemia 08/26/2018  . PARONYCHIA, RIGHT GREAT TOE 07/30/2008   Qualifier: Diagnosis of  By: Hassell Done FNP, Tori Milks    . Pressure ulcer 09/10/2019  . Right arm weakness 08/08/2019  . Sprain of left ankle   . STEMI (ST elevation myocardial  infarction) ([place]) 10/18/2019  . STEMI involving right coronary artery ([place]) 10/18/2019  . Stroke ([place])   . SVD (spontaneous vaginal delivery)    x 4  . Vaginosis 08/26/2018  . Weakness 09/10/2019  . Weakness of both lower extremities     Past Surgical History:  Procedure Laterality Date  . A/V FISTULAGRAM Left 01/12/2020   Procedure: A/V FISTULAGRAM;  Surgeon: Serafina Mitchell, MD;  Location: [facility];  Service: Cardiovascular;  Laterality: Left;  . AV FISTULA PLACEMENT Left 08/17/2019   Procedure: LEFT BRACHIAL CEPHALIC ARTERIOVENOUS (AV) FISTULA;  Surgeon: Angelia Mould, MD;  Location: [facility];  Service: Vascular;  Laterality: Left;  . CORONARY STENT INTERVENTION N/A 10/18/2019   Procedure: CORONARY STENT INTERVENTION;  Surgeon: Sherren Mocha, MD;  Location: [facility];  Service: Cardiovascular;  Laterality: N/A;  . CORONARY/GRAFT ACUTE MI REVASCULARIZATION N/A 10/18/2019   Procedure: Coronary/Graft Acute MI Revascularization;  Surgeon: Sherren Mocha, MD;  Location: [facility];  Service: Cardiovascular;  Laterality: N/A;  . EYE SURGERY Bilateral    cataracts removed  . FEMUR IM NAIL Left 10/28/2018   Procedure: RETROGRADE FEMORAL NAILING;  Surgeon: Meredith Pel, MD;  Location: [facility];  Service: Orthopedics;  Laterality: Left;  . IM NAILING FEMORAL SHAFT RETROGRADE Left 10/28/2018  . INTRAVASCULAR ULTRASOUND/IVUS N/A 10/18/2019   Procedure: Intravascular Ultrasound/IVUS;  Surgeon: Sherren Mocha, MD;  Location: [facility];  Service:  Cardiovascular;  Laterality: N/A;  . IR FLUORO GUIDE CV LINE RIGHT  08/11/2019  . IR THORACENTESIS ASP PLEURAL SPACE W/IMG GUIDE  11/12/2019  . IR US GUIDE VASC ACCESS RIGHT  08/11/2019  . KNEE SURGERY Left   . LEFT HEART CATH AND CORONARY ANGIOGRAPHY N/A 10/18/2019   Procedure: LEFT HEART CATH AND CORONARY ANGIOGRAPHY;  Surgeon: Sherren Mocha, MD;  Location: [facility];  Service: Cardiovascular;  Laterality: N/A;   . PERIPHERAL VASCULAR BALLOON ANGIOPLASTY Left 01/12/2020   Procedure: PERIPHERAL VASCULAR BALLOON ANGIOPLASTY;  Surgeon: Serafina Mitchell, MD;  Location: [facility];  Service: Cardiovascular;  Laterality: Left;  AVF  . RADIOLOGY WITH ANESTHESIA N/A 09/15/2019   Procedure: [facility] AND LUMBER LOWER BACK PAIN;  Surgeon: Radiologist, Medication, MD;  Location: [facility];  Service: Radiology;  Laterality: N/A;  . TUBAL LIGATION      Allergies  Allergen Reactions  . Penicillins     Prior to Admission medications   Medication Sig Start Date End Date Taking? Authorizing Provider  aspirin 81 MG EC tablet Take 1 tablet (81 mg total) by mouth daily. 01/13/20  Yes Perlie Mayo, NP  atorvastatin (LIPITOR) 80 MG tablet Take 1 tablet (80 mg total) by mouth daily at 6 PM. 11/24/19  Yes Weaver, Nicki Reaper T, PA-C  carvedilol (COREG) 6.25 MG tablet Take 1.5 tablets (9.375 mg total) by mouth 2 (two) times daily. 01/13/20  Yes Perlie Mayo, NP  epoetin alfa (EPOGEN) 3000 UNIT/ML injection Inject 3,000 Units into the skin every 14 (fourteen) days.  06/12/19  Yes [provider]  gabapentin (NEURONTIN) 300 MG capsule Take 1 capsule (300 mg total) by mouth at bedtime. 02/18/20  Yes Perlie Mayo, NP  hydrALAZINE (APRESOLINE) 10 MG tablet Take 1 tablet (10 mg total) by mouth in the morning and at bedtime. 11/24/19  Yes Weaver, Scott T, PA-C  insulin aspart protamine- aspart (NOVOLOG MIX 70/30) (70-30) 100 UNIT/ML injection Inject 0.1 mLs (10 Units total) into the skin 2 (two) times daily with a meal. 02/18/20  Yes Perlie Mayo, NP  isosorbide mononitrate (IMDUR) 30 MG 24 hr tablet Take 0.5 tablets (15 mg total) by mouth daily. 01/05/20 01/04/21 Yes Weaver, Scott T, PA-C  levothyroxine (SYNTHROID) 25 MCG tablet Take 1 tablet (25 mcg total) by mouth daily before breakfast. 02/18/20  Yes Perlie Mayo, NP  multivitamin (RENA-VIT) TABS tablet Take 1 tablet by mouth at bedtime. 08/25/19  Yes Hoyt Koch,  MD  oxyCODONE-acetaminophen (PERCOCET/ROXICET) 5-325 MG tablet Take 1 tablet by mouth every 6 (six) hours as needed. 02/26/20  Yes Milton Ferguson, MD  sertraline (ZOLOFT) 25 MG tablet Take 1 tablet (25 mg total) by mouth daily. 02/18/20 03/19/20 Yes Perlie Mayo, NP  tamsulosin (FLOMAX) 0.4 MG CAPS capsule Take 1 capsule (0.4 mg total) by mouth daily after supper. 02/18/20  Yes Perlie Mayo, NP  ticagrelor (BRILINTA) 90 MG TABS tablet Take 1 tablet (90 mg total) by mouth 2 (two) times daily. 11/24/19  Yes Weaver, Scott T, PA-C  nitroGLYCERIN (NITROSTAT) 0.4 MG SL tablet Place 1 tablet (0.4 mg total) under the tongue every 5 (five) minutes x 3 doses as needed for chest pain. 11/24/19   Richardson Dopp T, PA-C  ondansetron (ZOFRAN ODT) 4 MG disintegrating tablet 4mg  ODT q4 hours prn nausea/vomit 02/26/20   Milton Ferguson, MD  zolpidem (AMBIEN) 5 MG tablet Take 1 tablet (5 mg total) by mouth at bedtime as needed for sleep.  10/29/19   Soyla Dryer, PA-C    Social History   Socioeconomic History  . Marital status: Widowed    Spouse name: Not on file  . Number of children: 4  . Years of education: Not on file  . Highest education level: 10th grade  Occupational History  . Not on file  Tobacco Use  . Smoking status: Former Smoker    Packs/day: 0.25    Years: 2.00    Pack years: 0.50    Types: Cigarettes    Quit date: 1997    Years since quitting: 24.7  . Smokeless tobacco: Never Used  Vaping Use  . Vaping Use: Never used  Substance and Sexual Activity  . Alcohol use: No  . Drug use: No  . Sexual activity: Yes    Birth control/protection: Surgical    Comment: tubal   Other Topics Concern  . Not on file  Social History Narrative   Lives with friend with Joelene Millin   Cats: [place], [place], [place], another kitten      Enjoy: watching TV-[employer], [employer], and [employer]      Diet: eats all food groups: focuses on Dm and Kidney friendly foods    Caffeine: none   Water: Fluid Restriction 32  ounces a day          Wears seat belt: not currently driving    Oceanographer at home    No weapons    Social Determinants of Health   Financial Resource Strain: Low Risk   . Difficulty of Paying Living Expenses: Not hard at all  Food Insecurity: No Food Insecurity  . Worried About Charity fundraiser in the Last Year: Never true  . Ran Out of Food in the Last Year: Never true  Transportation Needs: No Transportation Needs  . Lack of Transportation (Medical): No  . Lack of Transportation (Non-Medical): No  Physical Activity: Inactive  . Days of Exercise per Week: 0 days  . Minutes of Exercise per Session: 0 min  Stress: No Stress Concern Present  . Feeling of Stress : Not at all  Social Connections: Socially Isolated  . Frequency of Communication with Friends and Family: Never  . Frequency of Social Gatherings with Friends and Family: Never  . Attends Religious Services: Never  . Active Member of Clubs or Organizations: No  . Attends Archivist Meetings: Never  . Marital Status: Widowed  Intimate Partner Violence: Not At Risk  . Fear of Current or Ex-Partner: No  . Emotionally Abused: No  . Physically Abused: No  . Sexually Abused: No     Family History  Problem Relation Age of Onset  . Diabetes Mother   . Hypertension Mother   . Bipolar disorder Mother   . Brain cancer Maternal Aunt   . Heart disease Maternal Grandmother   . Diabetes Maternal Grandmother   . Breast cancer Maternal Aunt   . Depression Father        Committed suicide  . Diabetes Sister     ROS: [x]  Positive   [ ]  Negative   [ ]  All sytems reviewed and are negative  Cardiovascular: []  chest pain/pressure []  palpitations []  SOB lying flat []  DOE []  pain in legs while walking []  pain in legs at rest []  pain in legs at night []  non-healing ulcers []  hx of DVT []  swelling in legs  Pulmonary: []  productive cough []  asthma/wheezing []  home O2  Neurologic: []  weakness in []   arms []  legs []   numbness in []  arms []  legs []  hx of CVA []  mini stroke [] difficulty speaking or slurred speech []  temporary loss of vision in one eye []  dizziness  Hematologic: []  hx of cancer []  bleeding problems []  problems with blood clotting easily  Endocrine:   []  diabetes []  thyroid disease  GI []  vomiting blood []  blood in stool  GU: []  CKD/renal failure []  HD--[]  M/W/F or []  T/T/S []  burning with urination []  blood in urine  Psychiatric: []  anxiety []  depression  Musculoskeletal: []  arthritis []  joint pain  Integumentary: []  rashes []  ulcers  Constitutional: []  fever []  chills   Physical Examination  Vitals:   03/17/20 0551  BP: (!) 153/75  Pulse: 87  Resp: 15  Temp: 98.5 F (36.9 C)  SpO2: 97%   Body mass index is 23.03 kg/m.  General:  WDWN in NAD Gait: Not observed HENT: WNL, normocephalic Pulmonary: normal non-labored breathing, without Rales, rhonchi,  wheezing Cardiac: regular, without  Murmurs, rubs or gallops Vascular Exam/Pulses: Left brachiocephalic AVF excellent thrill Left radial pulse palpable Extremities: without ischemic changes, without Gangrene , without cellulitis; without open wounds;  Musculoskeletal: no muscle wasting or atrophy  Neurologic: A&O X 3; Appropriate Affect ; SENSATION: normal; MOTOR FUNCTION:  moving all extremities equally. Speech is fluent/normal   CBC    Component Value Date/Time   WBC 8.1 11/12/2019 2309   RBC 3.43 (L) 11/12/2019 2309   HGB 12.2 03/17/2020 0602   HCT 36.0 03/17/2020 0602   HCT 32.1 (L) 09/10/2019 1820   PLT 186 11/12/2019 2309   MCV 103.2 (H) 11/12/2019 2309   MCH 32.1 11/12/2019 2309   MCHC 31.1 11/12/2019 2309   RDW 15.9 (H) 11/12/2019 2309   LYMPHSABS 1.4 11/12/2019 2309   MONOABS 0.7 11/12/2019 2309   EOSABS 0.4 11/12/2019 2309   BASOSABS 0.1 11/12/2019 2309    BMET    Component Value Date/Time   NA 136 03/17/2020 0602   K 3.3 (L) 03/17/2020 0602   CL 96 (L)  03/17/2020 0602   CO2 22 11/12/2019 2309   GLUCOSE 240 (H) 03/17/2020 0602   BUN 17 03/17/2020 0602   CREATININE 3.10 (H) 03/17/2020 0602   CALCIUM 8.9 11/12/2019 2309   GFRNONAA 11 (L) 11/12/2019 2309   GFRAA 13 (L) 11/12/2019 2309    COAGS: Lab Results  Component Value Date   INR 1.2 11/12/2019   INR 1.1 10/18/2019   INR 1.2 08/11/2019     Non-Invasive Vascular Imaging:    None  ASSESSMENT/PLAN: This is a 49 y.o. female with end-stage renal disease that presents with malfunctioning left brachiocephalic AV fistula that has also been slow to mature.  Previously had left cephalic vein angioplasty in July 2021 by Dr. Trula Slade.  Has an excellent thrill on exam.  She states this is still not working.  We will plan fistulogram with possible intervention.  Risk and benefits discussed.  Marty Heck, MD [facility] Office: [phone]

## 2020-03-17 NOTE — Discharge Instructions (Signed)

## 2020-03-17 NOTE — Op Note (Signed)
OPERATIVE NOTE   PROCEDURE: 1. left braciocepahlic arteriovenous fistula cannulation under ultrasound guidance 2. left arm fistulogram including central venogram 3. left upper arm cephalic vein angioplasty near cephalic arch (7 mm x 60 mm Mustang and 8 mm x 60 mm drug coated Lutonix)   PRE-OPERATIVE DIAGNOSIS: Malfunctioning left arteriovenous fistula  POST-OPERATIVE DIAGNOSIS: same as above   SURGEON: Marty Heck, MD  ANESTHESIA: local  ESTIMATED BLOOD LOSS: 5 cc  FINDING(S): 1. The previous segment in the mid upper arm cephalic vein that had been angioplastied earlier this year was patent with no recurrent stenosis.  No evidence of central venous stenosis.  She did have an area in the proximal upper arm cephalic vein near the arch where there was approximate 70% focal stenosis and approximate 60 mm segment that was smaller than the remainder of the fistula.  We angioplastied this with a 7 mm Mustang and then 8 mm drug-coated Lutonix.  There was some recoil of the lesion and I did not want to be more aggressive given significant pain with intervention.  She has an excellent thrill and I think she can attempt to use this.  I will have her follow-up in 1 month with a fistula duplex and if this is continuing to have issues may have to evaluate for new access options.  Dr. Trula Slade has discussed option of side branch ligation in the past if ongoing issues but now with cephalic arch stenosis not sure this will be successful.  She has a fair amount of infiltration in the upper arm from dialysis access and I think they should attempt to access this closer to antecubital region where it is more superficial.    SPECIMEN(S):  None  CONTRAST: 40 cc  INDICATIONS: Melanie Hall is a 49 y.o. female who  presents with malfunctioning left brachiocepahlic arteriovenous fistula.  The patient is scheduled for left arm fistulogram.  The patient is aware the risks include but are not limited to:  bleeding, infection, thrombosis of the cannulated access, and possible anaphylactic reaction to the contrast.  The patient is aware of the risks of the procedure and elects to proceed forward.  DESCRIPTION: After full informed written consent was obtained, the patient was brought back to the angiography suite and placed supine upon the angiography table.  The patient was connected to monitoring equipment.  The left arm was prepped and draped in the standard fashion for a left arm fistulogram.  Under ultrasound guidance, the left brachiocephalic arteriovenous fistula was evaluated, it was patent, an image was saved.  It was cannulated with a micropuncture needle.  The microwire was advanced into the fistula and the needle was exchanged for the a microsheath, which was lodged 2 cm into the access.  The wire was removed and the sheath was connected to the IV extension tubing.  Hand injections were completed to image the access from the antecubitum up to the level of axilla.  The central venous structures were also imaged by hand injections.  Ultimately elected to intervene on a proximal upper arm cephalic stenosis near the cephalic arch.  Used a Astronomer and exchanged for a short 6 [place] sheath.  Patient was given 3000 units of IV heparin.  Initially used a 7 mm x 60 mm Mustang inflated to nominal pressure across this lesion to nominal pressure for 2 minutes.  We then upsized to a 8 mm x 60 mm drug-coated Lutonix across the lesion for 3 minutes.  She had  significant pain with drug-coated balloon and I did not want to be more aggressive with an 8 mm balloon.  Overall the lesion looked much better and she had an excellent thrill.  I think she can attempt to use this.  That point time wires and catheters were removed.  A 4-0 Monocryl purse-string suture was sewn around the sheath.  The sheath was removed while tying down the suture.  A sterile bandage was applied to the puncture site.  COMPLICATIONS:  None  CONDITION: Stable  Marty Heck, MD [facility] Office: [phone]  03/17/2020 8:31 AM

## 2020-03-17 NOTE — Progress Notes (Signed)
Still waiting for transportation. Pt stable.

## 2020-03-17 NOTE — Progress Notes (Signed)
Called pt's transportation for 1000am pick up

## 2020-03-18 ENCOUNTER — Encounter (HOSPITAL_COMMUNITY): Payer: Self-pay | Admitting: Vascular Surgery

## 2020-03-18 MED FILL — Heparin Sod (Porcine)-NaCl IV Soln 1000 Unit/500ML-0.9%: INTRAVENOUS | Qty: 500 | Status: AC

## 2020-03-24 ENCOUNTER — Encounter (INDEPENDENT_AMBULATORY_CARE_PROVIDER_SITE_OTHER): Payer: Self-pay | Admitting: *Deleted

## 2020-03-24 ENCOUNTER — Other Ambulatory Visit: Payer: Self-pay

## 2020-03-24 ENCOUNTER — Other Ambulatory Visit (INDEPENDENT_AMBULATORY_CARE_PROVIDER_SITE_OTHER): Payer: Self-pay | Admitting: *Deleted

## 2020-03-24 ENCOUNTER — Encounter (INDEPENDENT_AMBULATORY_CARE_PROVIDER_SITE_OTHER): Payer: Self-pay | Admitting: Gastroenterology

## 2020-03-24 ENCOUNTER — Telehealth: Payer: Self-pay | Admitting: *Deleted

## 2020-03-24 ENCOUNTER — Ambulatory Visit (INDEPENDENT_AMBULATORY_CARE_PROVIDER_SITE_OTHER): Payer: Medicaid Other | Admitting: Gastroenterology

## 2020-03-24 VITALS — BP 88/56 | HR 78 | Temp 97.9°F | Ht 62.2 in | Wt 139.1 lb

## 2020-03-24 DIAGNOSIS — K529 Noninfective gastroenteritis and colitis, unspecified: Secondary | ICD-10-CM | POA: Diagnosis not present

## 2020-03-24 NOTE — Progress Notes (Signed)
Maylon Peppers, M.D. Gastroenterology & Hepatology [facility] [address] [place], [place] [address] Primary Care Physician: Perlie Mayo, NP [place] [address]  Referring MD: PCP  I will communicate my assessment and recommendations to the referring MD via EMR. Note: Occasional unusual wording and randomly placed punctuation marks may result from the use of speech recognition technology to transcribe this document"  Chief Complaint:  Chronic diarrhea  History of Present Illness: Melanie Hall is a 49 y.o. female with past medical history of diabetes complicated by end-stage renal disease on hemodialysis, coronary artery disease and MI status post multiple stents in March 2021 on dual antiplatelet therapy, heart failure, GERD, hypertension, stroke, who presents for evaluation of episodes of intermittent diarrhea.  States she has had chronic episodes of watery diarrhea for the last 2 years.  She reports that she usually is constipated and has a bowel movement every 2 to 3 days, some days her bowel movements have normal consistency.  However she has noticed that her bowel movement frequency has increased in the last 2 years, every day she is having at least 2-3 loose to watery bowel movements.  She denies any melena or hematochezia.  Due to this, she has had to take Imodium 2-3 times a week, she takes from 3 to 6 tablets of Imodium when the diarrhea is present.  She has been more concerned as she has had some episodes of fecal soiling.  Intermittently, she has presented episodes of lower abdominal pain without presence of abdominal distention.  She reports that she has never had similar symptoms in the past.  Has not presented any weight loss since her symptoms started.  Most recent abdominal imaging was a CT of the abdomen pelvis without IV contrast on 08/13/2019 which showed presence of bilateral effusions and anasarca, no  presence of acute abnormalities in her abdomen, there was presence of cholelithiasis without cholecystitis.  Most recent TSH on 12/29/2019 was normal 4.09  Denies taking any magnesium or other supplements.  Last Colonoscopy: Never  FHx: neg for any gastrointestinal/liver disease, no malignancies Social: Former smoking but quit in 1988, denies alcohol or illicit drug use Surgical: Tubal ligation  Past Medical History: Past Medical History:  Diagnosis Date  . Acute cystitis without hematuria   . Acute kidney failure ([place]) 02/28/2019  . Acute midline thoracic back pain 12/29/2019  . Acute on chronic renal failure ([place]) 02/28/2019  . Acute systolic heart failure ([place]) 10/21/2019  . Anemia   . Cataracts, bilateral    surgery to remove  . CATARACTS, BILATERAL 07/02/2007   Qualifier: Diagnosis of  By: Isla Pence    . Closed fracture of left femur ([place]) 10/28/2018  . Closed fracture of right ankle 11/06/2017  . Diabetes mellitus    type 2  . Emphysematous cystitis 08/26/2018  . Encounter for gynecological examination with Papanicolaou smear of cervix 01/14/2020  . Encounter for screening fecal occult blood testing 01/14/2020  . Encounter for screening for malignant neoplasm of cervix 12/29/2019  . Encounter for screening mammogram for malignant neoplasm of breast 12/29/2019  . ESRD on hemodialysis ([place])   . GERD (gastroesophageal reflux disease)   . Hyperlipidemia   . Hypertension   . Hypokalemia 08/26/2018  . IRREGULAR MENSES 09/14/2009   Qualifier: Diagnosis of  By: Hassell Done FNP, Tori Milks    . Loose stools 11/12/2019  . Normocytic anemia 08/26/2018  . PARONYCHIA, RIGHT GREAT TOE 07/30/2008  Qualifier: Diagnosis of  By: Hassell Done FNP, Tori Milks    . Pressure ulcer 09/10/2019  . Right arm weakness 08/08/2019  . Sprain of left ankle   . STEMI (ST elevation myocardial infarction) ([place]) 10/18/2019  . STEMI involving right coronary artery ([place]) 10/18/2019  . Stroke ([place])   . SVD (spontaneous vaginal  delivery)    x 4  . Vaginosis 08/26/2018  . Weakness 09/10/2019  . Weakness of both lower extremities     Past Surgical History: Past Surgical History:  Procedure Laterality Date  . A/V FISTULAGRAM Left 01/12/2020   Procedure: A/V FISTULAGRAM;  Surgeon: Serafina Mitchell, MD;  Location: [facility];  Service: Cardiovascular;  Laterality: Left;  . A/V FISTULAGRAM N/A 03/17/2020   Procedure: A/V FISTULAGRAM - Left Arm;  Surgeon: Marty Heck, MD;  Location: [facility];  Service: Cardiovascular;  Laterality: N/A;  . AV FISTULA PLACEMENT Left 08/17/2019   Procedure: LEFT BRACHIAL CEPHALIC ARTERIOVENOUS (AV) FISTULA;  Surgeon: Angelia Mould, MD;  Location: [facility];  Service: Vascular;  Laterality: Left;  . CORONARY STENT INTERVENTION N/A 10/18/2019   Procedure: CORONARY STENT INTERVENTION;  Surgeon: Sherren Mocha, MD;  Location: [facility];  Service: Cardiovascular;  Laterality: N/A;  . CORONARY/GRAFT ACUTE MI REVASCULARIZATION N/A 10/18/2019   Procedure: Coronary/Graft Acute MI Revascularization;  Surgeon: Sherren Mocha, MD;  Location: [facility];  Service: Cardiovascular;  Laterality: N/A;  . EYE SURGERY Bilateral    cataracts removed  . FEMUR IM NAIL Left 10/28/2018   Procedure: RETROGRADE FEMORAL NAILING;  Surgeon: Meredith Pel, MD;  Location: [facility];  Service: Orthopedics;  Laterality: Left;  . IM NAILING FEMORAL SHAFT RETROGRADE Left 10/28/2018  . INTRAVASCULAR ULTRASOUND/IVUS N/A 10/18/2019   Procedure: Intravascular Ultrasound/IVUS;  Surgeon: Sherren Mocha, MD;  Location: [facility];  Service: Cardiovascular;  Laterality: N/A;  . IR FLUORO GUIDE CV LINE RIGHT  08/11/2019  . IR THORACENTESIS ASP PLEURAL SPACE W/IMG GUIDE  11/12/2019  . IR US GUIDE VASC ACCESS RIGHT  08/11/2019  . KNEE SURGERY Left   . LEFT HEART CATH AND CORONARY ANGIOGRAPHY N/A 10/18/2019   Procedure: LEFT HEART CATH AND CORONARY ANGIOGRAPHY;  Surgeon: Sherren Mocha,  MD;  Location: [facility];  Service: Cardiovascular;  Laterality: N/A;  . PERIPHERAL VASCULAR BALLOON ANGIOPLASTY Left 01/12/2020   Procedure: PERIPHERAL VASCULAR BALLOON ANGIOPLASTY;  Surgeon: Serafina Mitchell, MD;  Location: [facility];  Service: Cardiovascular;  Laterality: Left;  AVF  . PERIPHERAL VASCULAR BALLOON ANGIOPLASTY Left 03/17/2020   Procedure: PERIPHERAL VASCULAR BALLOON ANGIOPLASTY;  Surgeon: Marty Heck, MD;  Location: [facility];  Service: Cardiovascular;  Laterality: Left;  AVF  . RADIOLOGY WITH ANESTHESIA N/A 09/15/2019   Procedure: [facility] AND LUMBER LOWER BACK PAIN;  Surgeon: Radiologist, Medication, MD;  Location: [facility];  Service: Radiology;  Laterality: N/A;  . TUBAL LIGATION      Family History: Family History  Problem Relation Age of Onset  . Diabetes Mother   . Hypertension Mother   . Bipolar disorder Mother   . Brain cancer Maternal Aunt   . Heart disease Maternal Grandmother   . Diabetes Maternal Grandmother   . Breast cancer Maternal Aunt   . Depression Father        Committed suicide  . Diabetes Sister     Social History: Social History   Tobacco Use  Smoking Status Former Smoker  . Packs/day: 0.25  .  Years: 2.00  . Pack years: 0.50  . Types: Cigarettes  . Quit date: 66  . Years since quitting: 24.7  Smokeless Tobacco Never Used   Social History   Substance and Sexual Activity  Alcohol Use No   Social History   Substance and Sexual Activity  Drug Use No    Allergies: Allergies  Allergen Reactions  . Contrast Media [Iodinated Diagnostic Agents] Nausea And Vomiting    Treated with Benadryl & Solumedrol  . Penicillins     Medications: Current Outpatient Medications  Medication Sig Dispense Refill  . aspirin 81 MG EC tablet Take 1 tablet (81 mg total) by mouth daily. 90 tablet 3  . atorvastatin (LIPITOR) 80 MG tablet Take 1 tablet (80 mg total) by mouth daily at 6 PM. 90 tablet 3  . carvedilol  (COREG) 6.25 MG tablet Take 1.5 tablets (9.375 mg total) by mouth 2 (two) times daily. 180 tablet 3  . epoetin alfa (EPOGEN) 3000 UNIT/ML injection Inject 3,000 Units into the skin every 14 (fourteen) days.     Marland Kitchen gabapentin (NEURONTIN) 300 MG capsule Take 1 capsule (300 mg total) by mouth at bedtime. 90 capsule 0  . hydrALAZINE (APRESOLINE) 10 MG tablet Take 1 tablet (10 mg total) by mouth in the morning and at bedtime. 180 tablet 3  . insulin aspart protamine- aspart (NOVOLOG MIX 70/30) (70-30) 100 UNIT/ML injection Inject 0.1 mLs (10 Units total) into the skin 2 (two) times daily with a meal. 10 mL 3  . isosorbide mononitrate (IMDUR) 30 MG 24 hr tablet Take 0.5 tablets (15 mg total) by mouth daily. 15 tablet 11  . levothyroxine (SYNTHROID) 25 MCG tablet Take 1 tablet (25 mcg total) by mouth daily before breakfast. 30 tablet 1  . multivitamin (RENA-VIT) TABS tablet Take 1 tablet by mouth at bedtime. 30 tablet 0  . nitroGLYCERIN (NITROSTAT) 0.4 MG SL tablet Place 1 tablet (0.4 mg total) under the tongue every 5 (five) minutes x 3 doses as needed for chest pain. 25 tablet 3  . ondansetron (ZOFRAN ODT) 4 MG disintegrating tablet 4mg  ODT q4 hours prn nausea/vomit 12 tablet 0  . tamsulosin (FLOMAX) 0.4 MG CAPS capsule Take 1 capsule (0.4 mg total) by mouth daily after supper. 90 capsule 0  . ticagrelor (BRILINTA) 90 MG TABS tablet Take 1 tablet (90 mg total) by mouth 2 (two) times daily. 180 tablet 3  . sertraline (ZOLOFT) 25 MG tablet Take 1 tablet (25 mg total) by mouth daily. 30 tablet 0   Current Facility-Administered Medications  Medication Dose Route Frequency Provider Last Rate Last Admin  . 0.9 %  sodium chloride infusion  250 mL Intravenous PRN Marty Heck, MD        Review of Systems: GENERAL: negative for malaise, night sweats HEENT: No changes in hearing or vision, no nose bleeds or other nasal problems. NECK: Negative for lumps, goiter, pain and significant neck  swelling RESPIRATORY: Negative for cough, wheezing CARDIOVASCULAR: Negative for chest pain, leg swelling, palpitations, orthopnea GI: SEE HPI MUSCULOSKELETAL: Negative for joint pain or swelling, back pain, and muscle pain. SKIN: Negative for lesions, rash PSYCH: Negative for sleep disturbance, mood disorder and recent psychosocial stressors. HEMATOLOGY Negative for prolonged bleeding, bruising easily, and swollen nodes. ENDOCRINE: Negative for cold or heat intolerance, polyuria, polydipsia and goiter. NEURO: negative for tremor, gait imbalance, syncope and seizures. The remainder of the review of systems is noncontributory.   Physical Exam: BP (!) 88/56 (BP Location: Right Arm, Patient  Position: Sitting, Cuff Size: Normal)   Pulse 78   Temp 97.9 F (36.6 C) (Oral)   Ht 5' 2.2" (1.58 m)   Wt 139 lb 1.6 oz (63.1 kg)   LMP 12/17/2016   BMI 25.28 kg/m  GENERAL: The patient is AO x3, in no acute distress. HEENT: Head is normocephalic and atraumatic. EOMI are intact. Mouth is well hydrated and without lesions. NECK: Supple. No masses LUNGS: Clear to auscultation. No presence of rhonchi/wheezing/rales. Adequate chest expansion HEART: RRR, normal s1 and s2. ABDOMEN: Soft, nontender, no guarding, no peritoneal signs, and nondistended. BS +. No masses. EXTREMITIES: Without any cyanosis, clubbing, rash, lesions or edema.  Presence of an AV fistula in her upper extremity. NEUROLOGIC: AOx3, no focal motor deficit. SKIN: no jaundice, no rashes   Imaging/Labs: as above  I personally reviewed and interpreted the available labs, imaging and endoscopic files.  Impression and Plan: Melanie Hall is a 49 y.o. female with past medical history of diabetes complicated by end-stage renal disease on hemodialysis, coronary artery disease and MI status post multiple stents in March 2021 on dual antiplatelet therapy, heart failure, GERD, hypertension, stroke, who presents for evaluation of episodes of  intermittent diarrhea.  Patient has presented intermittent episodes of diarrhea that have become more frequent recently.  She has not presented any red flag signs but her symptoms have persisted for the last 3 years.  She is not taking any medication or supplements that could potentially lead to these alterations.  She has had significant complication due to her diabetes, for which diabetic diarrhea remains in the differential.  Nevertheless, other etiologies such as infectious diarrhea and celiac disease will need to be evaluated with serology and stool testing.  Also, will characterize if her diarrhea is moderate versus secretory with stool electrolytes.  Finally, the patient will need to undergo a colonoscopy as she is due for it for screening purposes but also to obtain samples for microscopic colitis.  This is nonurgent, but the patient will need to be off her Plavix 5 days before the procedure as biopsies will need to be performed.  We will also obtain an abdominal x-ray to rule out if her previous constipation led to overflow diarrhea.  The patient understood and agreed.  She can continue taking her Imodium for now as she was previously taking.  - Check TTG IgA and IgA - Perform stool electrolytes, osmolality, GI path, O/P and c. diff - Schedule colonoscopy - will obtain clearance from cardiologist to stop Brillinta - this is a non urgent procedure - AXR - C/w Imodium as needed  All questions were answered.      Maylon Peppers, MD Gastroenterology and Hepatology [facility]

## 2020-03-24 NOTE — Telephone Encounter (Signed)
   Primary Cardiologist: Sherren Mocha, MD  Chart reviewed as part of pre-operative protocol coverage. She is s/p a late presenting inferior STEMI 10/2019 with PCI/DES x2 to RCA, recommended for uninterrupted DAPT x 1 year.   Attempted to contact the patient today to clarify the urgency of this procedure but no answer.   Callback: - Can you please clarify with the requesting office the urgency of this procedure. If for routine screening purposes, would prefer to wait until after 10/2020 in an effort to minimize interruptions in her DAPT. Thank you!   Abigail Butts, PA-C 03/24/2020, 3:47 PM

## 2020-03-24 NOTE — Patient Instructions (Addendum)
Perform blood workup Perform stool testing Schedule colonoscopy - will obtain clearance from cardiologist Richardson Dopp, PA) Can take Imodium up to 8 mg per day

## 2020-03-24 NOTE — Telephone Encounter (Signed)
Call placed to [facility] regarding surgical clearance and the need for more information. Left a message for the office to call back.

## 2020-03-24 NOTE — Telephone Encounter (Signed)
   [facility] Pre-operative Risk Assessment    HEARTCARE STAFF: - Please ensure there is not already an duplicate clearance open for this procedure. - Under Visit Info/Reason for Call, type in Other and utilize the format Clearance MM/DD/YY or Clearance TBD. Do not use dashes or single digits. - If request is for dental extraction, please clarify the # of teeth to be extracted.  Request for surgical clearance:  1. What type of surgery is being performed?  COLONSCOPY   2. When is this surgery scheduled?  04/15/20   3. What type of clearance is required (medical clearance vs. Pharmacy clearance to hold med vs. Both)?  BOTH  4. Are there any medications that need to be held prior to surgery and how long? BRILINTA X'S 2 DAYS   5. Practice name and name of physician performing surgery?  [facility] / DR. CASTANEDA   6. What is the office phone number?  [phone]   7.   What is the office fax number?  [phone]  8.   Anesthesia type (None, local, MAC, general) ?  PROPOFOL    Jeanann Lewandowsky 03/24/2020, 3:01 PM  _________________________________________________________________   (provider comments below)

## 2020-03-25 LAB — IGA: Immunoglobulin A: 286 mg/dL (ref 47–310)

## 2020-03-25 LAB — TISSUE TRANSGLUTAMINASE, IGA: (tTG) Ab, IgA: 1 U/mL

## 2020-03-25 NOTE — Telephone Encounter (Signed)
Dr. Burt Knack, pt needs colonoscopy due to chronic diarrhea-causing increased fatigue. She would need to come off Brilinta and her stents were placed 10/18/19.  Would like your input.  Thanks, please return to pre-op pool.

## 2020-03-25 NOTE — Telephone Encounter (Signed)
Dr. Burt Knack pt needs colonscopy due to chronic diarrhea, it is causing increased fatigue, she would need to hold Brilinta and her stents to RCA done 10/18/19  Would like your input.

## 2020-03-25 NOTE — Telephone Encounter (Signed)
Spoke with Lelon Frohlich at [facility] and she states that the procedure is non-urgent but the patient has had chronic diarrhea for 2 years. I spoke with the patient and she states that she has ongoing diarrhea and often times feels too weak and does not even feel up to doing her dialysis. She would really like to be able to proceed with the scheduled procedure but will hold off if cardiology feels that this is necessary. She would like a call back with an update.

## 2020-03-27 NOTE — Telephone Encounter (Signed)
OK to hold brilinta x 5 days before colonoscopy, but shouldn't hold until after Apr 18, 2020. Continue ASA without interruption.

## 2020-03-28 NOTE — Telephone Encounter (Signed)
See MC's message

## 2020-03-28 NOTE — Telephone Encounter (Addendum)
   Primary Cardiologist: Sherren Mocha, MD  Chart reviewed as part of pre-operative protocol coverage. Patient was contacted 03/28/2020 in reference to pre-operative risk assessment for pending surgery as outlined below.  Melanie Hall was last seen 12/2019 by Richardson Dopp PA-C with history of CAD with late presenting inferior STEMI 10/2019 s/p PCI to RCA, systolic CHF, ESRD, DM, HTN, HLD. Also had drug coated balloon angioplasty of left cephalic vein by vascular surgery in 01/2020. She is on ASA/Brilinta.  Given recent PCI, clearance was sent to Dr. Burt Knack for review who states, "OK to hold brilinta x 5 days before colonoscopy, but shouldn't hold until after Apr 18, 2020. Continue ASA without interruption." When finalizing clearance would also recommend GI team reach out to vascular for their input as well since patient has history of intervention recently by them as well.  Reached out to patient to discuss clinical stability but got VM x 2. LMOMTCB.  Charlie Pitter, PA-C 03/28/2020, 9:50 AM

## 2020-03-29 ENCOUNTER — Ambulatory Visit (HOSPITAL_COMMUNITY): Payer: Self-pay | Admitting: Physical Therapy

## 2020-03-30 ENCOUNTER — Telehealth: Payer: Self-pay | Admitting: Family Medicine

## 2020-03-30 ENCOUNTER — Other Ambulatory Visit: Payer: Self-pay | Admitting: *Deleted

## 2020-03-30 DIAGNOSIS — Z794 Long term (current) use of insulin: Secondary | ICD-10-CM

## 2020-03-30 MED ORDER — INSULIN ASPART PROT & ASPART (70-30 MIX) 100 UNIT/ML ~~LOC~~ SUSP: 10.0000 [IU] | Freq: Two times a day (BID) | SUBCUTANEOUS | 3 refills | Status: DC

## 2020-03-30 NOTE — Telephone Encounter (Signed)
error 

## 2020-03-30 NOTE — Telephone Encounter (Signed)
Patient is needing a prescription sent to [facility] for syringes for insulin phone is [phone]

## 2020-03-31 ENCOUNTER — Other Ambulatory Visit: Payer: Self-pay

## 2020-03-31 ENCOUNTER — Ambulatory Visit (HOSPITAL_COMMUNITY): Payer: Medicaid Other | Admitting: Physical Therapy

## 2020-03-31 ENCOUNTER — Encounter (HOSPITAL_COMMUNITY): Payer: Self-pay | Admitting: Physical Therapy

## 2020-03-31 DIAGNOSIS — M6281 Muscle weakness (generalized): Secondary | ICD-10-CM | POA: Diagnosis not present

## 2020-03-31 DIAGNOSIS — G8929 Other chronic pain: Secondary | ICD-10-CM | POA: Diagnosis not present

## 2020-03-31 DIAGNOSIS — R296 Repeated falls: Secondary | ICD-10-CM

## 2020-03-31 DIAGNOSIS — R262 Difficulty in walking, not elsewhere classified: Secondary | ICD-10-CM

## 2020-03-31 DIAGNOSIS — M545 Low back pain, unspecified: Secondary | ICD-10-CM

## 2020-03-31 MED ORDER — INSULIN SYRINGES (DISPOSABLE) U-100 1 ML MISC: 5 refills | Status: DC

## 2020-03-31 NOTE — Addendum Note (Signed)
Addended by: Josue Hector A on: 03/31/2020 04:29 PM   Modules accepted: Orders

## 2020-03-31 NOTE — Therapy (Signed)
[facility] [address] [place], [place], [address] Phone: [phone]   Fax:  [phone]  Physical Therapy Treatment  Patient Details  Name: MARSENA TAFF MRN: [id number] Date of Birth: 05/26/1971 Referring Provider (PT): Cherly Beach   Progress Note Reporting Period 01/21/20 to 03/31/20  See note below for Objective Data and Assessment of Progress/Goals.      Encounter Date: 03/31/2020   PT End of Session - 03/31/20 1553    Visit Number 14    Number of Visits 21    Date for PT Re-Evaluation 04/29/20    Authorization Type [employer] check auth submitted for 8 more visits on 03/31/20    Authorization - Visit Number 1    Authorization - Number of Visits 8    Progress Note Due on Visit 20    PT Start Time 1550    PT Stop Time 1625    PT Time Calculation (min) 35 min    Activity Tolerance Patient tolerated treatment well    Behavior During Therapy WFL for tasks assessed/performed           Past Medical History:  Diagnosis Date  . Acute cystitis without hematuria   . Acute kidney failure ([place]) 02/28/2019  . Acute midline thoracic back pain 12/29/2019  . Acute on chronic renal failure ([place]) 02/28/2019  . Acute systolic heart failure ([place]) 10/21/2019  . Anemia   . Cataracts, bilateral    surgery to remove  . CATARACTS, BILATERAL 07/02/2007   Qualifier: Diagnosis of  By: Isla Pence    . Closed fracture of left femur ([place]) 10/28/2018  . Closed fracture of right ankle 11/06/2017  . Diabetes mellitus    type 2  . Emphysematous cystitis 08/26/2018  . Encounter for gynecological examination with Papanicolaou smear of cervix 01/14/2020  . Encounter for screening fecal occult blood testing 01/14/2020  . Encounter for screening for malignant neoplasm of cervix 12/29/2019  . Encounter for screening mammogram for malignant neoplasm of breast 12/29/2019  . ESRD on hemodialysis ([place])   . GERD (gastroesophageal reflux disease)   .  Hyperlipidemia   . Hypertension   . Hypokalemia 08/26/2018  . IRREGULAR MENSES 09/14/2009   Qualifier: Diagnosis of  By: Hassell Done FNP, Tori Milks    . Loose stools 11/12/2019  . Normocytic anemia 08/26/2018  . PARONYCHIA, RIGHT GREAT TOE 07/30/2008   Qualifier: Diagnosis of  By: Hassell Done FNP, Tori Milks    . Pressure ulcer 09/10/2019  . Right arm weakness 08/08/2019  . Sprain of left ankle   . STEMI (ST elevation myocardial infarction) ([place]) 10/18/2019  . STEMI involving right coronary artery ([place]) 10/18/2019  . Stroke ([place])   . SVD (spontaneous vaginal delivery)    x 4  . Vaginosis 08/26/2018  . Weakness 09/10/2019  . Weakness of both lower extremities     Past Surgical History:  Procedure Laterality Date  . A/V FISTULAGRAM Left 01/12/2020   Procedure: A/V FISTULAGRAM;  Surgeon: Serafina Mitchell, MD;  Location: [facility];  Service: Cardiovascular;  Laterality: Left;  . A/V FISTULAGRAM N/A 03/17/2020   Procedure: A/V FISTULAGRAM - Left Arm;  Surgeon: Marty Heck, MD;  Location: [facility];  Service: Cardiovascular;  Laterality: N/A;  . AV FISTULA PLACEMENT Left 08/17/2019   Procedure: LEFT BRACHIAL CEPHALIC ARTERIOVENOUS (AV) FISTULA;  Surgeon: Angelia Mould, MD;  Location: [facility];  Service: Vascular;  Laterality: Left;  . CORONARY STENT INTERVENTION N/A 10/18/2019  Procedure: CORONARY STENT INTERVENTION;  Surgeon: Sherren Mocha, MD;  Location: [facility];  Service: Cardiovascular;  Laterality: N/A;  . CORONARY/GRAFT ACUTE MI REVASCULARIZATION N/A 10/18/2019   Procedure: Coronary/Graft Acute MI Revascularization;  Surgeon: Sherren Mocha, MD;  Location: [facility];  Service: Cardiovascular;  Laterality: N/A;  . EYE SURGERY Bilateral    cataracts removed  . FEMUR IM NAIL Left 10/28/2018   Procedure: RETROGRADE FEMORAL NAILING;  Surgeon: Meredith Pel, MD;  Location: [facility];  Service: Orthopedics;  Laterality: Left;  . IM NAILING FEMORAL SHAFT RETROGRADE  Left 10/28/2018  . INTRAVASCULAR ULTRASOUND/IVUS N/A 10/18/2019   Procedure: Intravascular Ultrasound/IVUS;  Surgeon: Sherren Mocha, MD;  Location: [facility];  Service: Cardiovascular;  Laterality: N/A;  . IR FLUORO GUIDE CV LINE RIGHT  08/11/2019  . IR THORACENTESIS ASP PLEURAL SPACE W/IMG GUIDE  11/12/2019  . IR US GUIDE VASC ACCESS RIGHT  08/11/2019  . KNEE SURGERY Left   . LEFT HEART CATH AND CORONARY ANGIOGRAPHY N/A 10/18/2019   Procedure: LEFT HEART CATH AND CORONARY ANGIOGRAPHY;  Surgeon: Sherren Mocha, MD;  Location: [facility];  Service: Cardiovascular;  Laterality: N/A;  . PERIPHERAL VASCULAR BALLOON ANGIOPLASTY Left 01/12/2020   Procedure: PERIPHERAL VASCULAR BALLOON ANGIOPLASTY;  Surgeon: Serafina Mitchell, MD;  Location: [facility];  Service: Cardiovascular;  Laterality: Left;  AVF  . PERIPHERAL VASCULAR BALLOON ANGIOPLASTY Left 03/17/2020   Procedure: PERIPHERAL VASCULAR BALLOON ANGIOPLASTY;  Surgeon: Marty Heck, MD;  Location: [facility];  Service: Cardiovascular;  Laterality: Left;  AVF  . RADIOLOGY WITH ANESTHESIA N/A 09/15/2019   Procedure: [facility] AND LUMBER LOWER BACK PAIN;  Surgeon: Radiologist, Medication, MD;  Location: [facility];  Service: Radiology;  Laterality: N/A;  . TUBAL LIGATION      There were no vitals filed for this visit.   Subjective Assessment - 03/31/20 1557    Subjective Patient says she can tell she is doing better and is able to walk a little more with less assistance. Notes she got a new walker recently and this has been helpful. Still feels she has trouble with ongoing weakness and balance difficulty. Reports about 50% improvement since starting therapy.    Pertinent History HTN,DM, CVA, femoral nailing 10/2018, LE weakness on dialysis (MWF)    Limitations Lifting;Standing;Walking;House hold activities    How long can you sit comfortably? no problem    How long can you walk comfortably? 3 hours using a walker or cart.     Patient Stated Goals to be off her walker    Currently in Pain? Yes    Pain Score 3     Pain Location Back    Pain Orientation Posterior;Mid    Pain Descriptors / Indicators Aching    Pain Onset More than a month ago    Effect of Pain on Daily Activities Limits              [facility] PT Assessment - 03/31/20 0001      Assessment   Medical Diagnosis LE weakness, gait instability and back pain     Referring Provider (PT) Cherly Beach     Onset Date/Surgical Date 10/27/18    Prior Therapy HH for 2 weeks       Precautions   Precautions Fall      Restrictions   Weight Bearing Restrictions No      Balance Screen   Has the patient fallen in the past 6 months  No      Home [employer] residence      Prior Function   Level of Independence Independent      Cognition   Overall Cognitive Status Within Functional Limits for tasks assessed      Strength   Right Hip Flexion 4/5   was 4-   Right Hip Extension 3/5    Right Hip ABduction 3+/5   was 3   Left Hip Flexion 4/5   was 4-   Left Hip Extension 3+/5   was 3-   Left Hip ABduction 3+/5   was 3   Right Knee Extension 4+/5    Left Knee Extension 4/5      Transfers   Five time sit to stand comments  Unable to complete due to weakness and instability, cannot safely perform even with use of hands      Balance   Balance Assessed Yes      Static Standing Balance   Static Standing Balance -  Activities  Single Leg Stance - Right Leg;Single Leg Stance - Left Leg    Static Standing - Comment/# of Minutes 5 sec, unable                                  PT Education - 03/31/20 1601    Education Details on reassessment findings    Person(s) Educated Patient    Methods Explanation    Comprehension Verbalized understanding            PT Short Term Goals - 03/31/20 1616      PT SHORT TERM GOAL #1   Title Pt to be I in her HEP to improve her functioning level.    Baseline  reports adherence to HEP    Time 4    Period Weeks    Status Achieved    Target Date 02/19/20      PT SHORT TERM GOAL #2   Title PT core and LE strength increased one grade to allow pt to be able to come sit to stand from a chair without the use of her UE>    Baseline Improved strength but still requires UEs for sit/stand    Time 4    Period Weeks    Status On-going      PT SHORT TERM GOAL #3   Title PT to be able to single leg stance on both LE for at least 20 seconds to allow her to feel confident walking in her home with a cane.    Baseline RT 5 sec, LT unable    Time 4    Period Weeks    Status On-going             PT Long Term Goals - 03/31/20 1618      PT LONG TERM GOAL #1   Title PT to be completing an advanced HEP to allow pt functional ability to increase.    Time 8    Period Weeks    Status On-going      PT LONG TERM GOAL #2   Title PT core and LE strength to be at least a 4+ to allow pt to squat down and pick items off the floor for housework/yardwork and to go up and down 8 steps    Baseline See MMT    Time 8    Period Weeks    Status On-going  PT LONG TERM GOAL #3   Title Pt to be able to single leg stance for at least 30 seconds to allow pt to feel confident walking in her home without an assistive device.    Baseline Unable    Time 8    Period Weeks    Status On-going                 Plan - 03/31/20 1619    Clinical Impression Statement Patient shows minimal progress toward therapy goals. Patient does show some improvement in isolated LE strength testing with MMTs, but continues to demo significant functional strength deficits. Patient became very unstable when attempting sit to stand using hand on knees. Patient unable to stabilize independently for attempted single limb stance as per recorded therapy goals. Patient continues to be limited by reduced balance and weakness. Patient will continue to benefit from skilled therapy services to  address these deficits to improve functional mobility and reduce risk for falls.    Personal Factors and Comorbidities Past/Current Experience;Fitness;Time since onset of injury/illness/exacerbation    Examination-Activity Limitations Carry;Dressing;Lift;Locomotion Level;Stairs;Squat    Examination-Participation Restrictions Cleaning;Community Activity;Laundry;Shop    Stability/Clinical Decision Making Stable/Uncomplicated    Rehab Potential Good    PT Frequency 2x / week    PT Duration 4 weeks    PT Treatment/Interventions Therapeutic activities;Therapeutic exercise;Balance training;Neuromuscular re-education;Stair training;Functional mobility training;Gait training;Patient/family education;Manual techniques    PT Next Visit Plan balance, and functional strengthening as tolerated.    PT Home Exercise Plan heel raises, sit to stand, squats and bridges; 7/20 LAQs    Consulted and Agree with Plan of Care Patient           Patient will benefit from skilled therapeutic intervention in order to improve the following deficits and impairments:  Abnormal gait, Decreased activity tolerance, Decreased balance, Difficulty walking, Decreased strength, Pain  Visit Diagnosis: Difficulty in walking, not elsewhere classified  Repeated falls  Chronic midline low back pain without sciatica  Muscle weakness (generalized)     Problem List Patient Active Problem List   Diagnosis Date Noted  . Chronic diarrhea of unknown origin 01/14/2020  . Type 2 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, with long-term current use of insulin ([place]) 12/29/2019  . Hypothyroidism 12/29/2019  . Weakness of both lower extremities 11/12/2019  . Debility 09/10/2019  . ESRD (end stage renal disease) on dialysis ([place]) 09/10/2019  . Depression 09/10/2019  . Incontinence in female 09/10/2019  . Anemia in chronic kidney disease 08/19/2019  . Accelerated hypertension 08/08/2019  . MGUS (monoclonal  gammopathy of unknown significance) 07/23/2019  . GERD (gastroesophageal reflux disease) 02/28/2019  . Essential hypertension 10/28/2018  . Irregular menses 09/14/2009  . Hypercholesterolemia 11/19/2008  . Type 2 diabetes with nephropathy ([place]) 07/02/2007   4:26 PM, 03/31/20 Josue Hector PT DPT  Physical Therapist with [facility]  [phone] [facility] [address] [place], [place], [address] Phone: [phone]   Fax:  [phone]  Name: AILENE ROYAL MRN: [id number] Date of Birth: [birth year]

## 2020-03-31 NOTE — Telephone Encounter (Signed)
Left message to call back 9:20 AM 03/31/2020

## 2020-03-31 NOTE — Telephone Encounter (Signed)
Needles sent

## 2020-04-05 ENCOUNTER — Encounter (HOSPITAL_COMMUNITY): Payer: Self-pay | Admitting: *Deleted

## 2020-04-05 ENCOUNTER — Other Ambulatory Visit: Payer: Self-pay

## 2020-04-05 ENCOUNTER — Emergency Department (HOSPITAL_COMMUNITY)
Admission: EM | Admit: 2020-04-05 | Discharge: 2020-04-05 | Disposition: A | Discharge status: Home / Self Care | Payer: Medicaid Other | Attending: Emergency Medicine | Admitting: Emergency Medicine

## 2020-04-05 DIAGNOSIS — Z7901 Long term (current) use of anticoagulants: Secondary | ICD-10-CM | POA: Insufficient documentation

## 2020-04-05 DIAGNOSIS — N186 End stage renal disease: Secondary | ICD-10-CM | POA: Insufficient documentation

## 2020-04-05 DIAGNOSIS — Z794 Long term (current) use of insulin: Secondary | ICD-10-CM | POA: Diagnosis not present

## 2020-04-05 DIAGNOSIS — Z992 Dependence on renal dialysis: Secondary | ICD-10-CM | POA: Insufficient documentation

## 2020-04-05 DIAGNOSIS — Z87891 Personal history of nicotine dependence: Secondary | ICD-10-CM | POA: Diagnosis not present

## 2020-04-05 DIAGNOSIS — I252 Old myocardial infarction: Secondary | ICD-10-CM | POA: Diagnosis not present

## 2020-04-05 DIAGNOSIS — M7989 Other specified soft tissue disorders: Secondary | ICD-10-CM | POA: Diagnosis not present

## 2020-04-05 DIAGNOSIS — Z79899 Other long term (current) drug therapy: Secondary | ICD-10-CM | POA: Diagnosis not present

## 2020-04-05 DIAGNOSIS — Z955 Presence of coronary angioplasty implant and graft: Secondary | ICD-10-CM | POA: Insufficient documentation

## 2020-04-05 DIAGNOSIS — E1122 Type 2 diabetes mellitus with diabetic chronic kidney disease: Secondary | ICD-10-CM | POA: Insufficient documentation

## 2020-04-05 DIAGNOSIS — T82848A Pain from vascular prosthetic devices, implants and grafts, initial encounter: Secondary | ICD-10-CM | POA: Diagnosis not present

## 2020-04-05 DIAGNOSIS — I132 Hypertensive heart and chronic kidney disease with heart failure and with stage 5 chronic kidney disease, or end stage renal disease: Secondary | ICD-10-CM | POA: Insufficient documentation

## 2020-04-05 DIAGNOSIS — I5021 Acute systolic (congestive) heart failure: Secondary | ICD-10-CM | POA: Insufficient documentation

## 2020-04-05 DIAGNOSIS — M79602 Pain in left arm: Secondary | ICD-10-CM

## 2020-04-05 DIAGNOSIS — Z7982 Long term (current) use of aspirin: Secondary | ICD-10-CM | POA: Diagnosis not present

## 2020-04-05 MED ORDER — OXYCODONE-ACETAMINOPHEN 5-325 MG PO TABS
1.0000 | ORAL_TABLET | Freq: Once | ORAL | Status: AC
  Administered 2020-04-05: 1 via ORAL
  Filled 2020-04-05: qty 1

## 2020-04-05 NOTE — ED Provider Notes (Signed)
[facility] EMERGENCY DEPARTMENT Provider Note   CSN: [id number] Arrival date & time: 04/05/20  1215     History Chief Complaint  Patient presents with  . Vascular Access Problem    Melanie Hall is a 49 y.o. female with pertinent pmh of ESRD on HD MWF, CAD s/p STEMI and stents on Brillinta presents to ER for evaluation of left arm pain that began yesterday 30-60 min after left arm fistula was accessed for HD.  She was able to complete her treatment via chest catheter.  Has associated swelling, firmness along her fistula. Denies distal hand tingling, numbness, coolness. Her pain is localized to area of fistula, non radiating. Worse with movement, palpation. Has iced without relief. Does not think the swelling has worsened but the pain persists. Reports left arm fistula placed February 2021 by Dr Scot Dock. Reports having to have two procedures to fix complications from the fistula, last one a couple of weeks ago. History of CKD progressed to ESRD and HD started January 2021. [facility].   HPI     Past Medical History:  Diagnosis Date  . Acute cystitis without hematuria   . Acute kidney failure ([place]) 02/28/2019  . Acute midline thoracic back pain 12/29/2019  . Acute on chronic renal failure ([place]) 02/28/2019  . Acute systolic heart failure ([place]) 10/21/2019  . Anemia   . Cataracts, bilateral    surgery to remove  . CATARACTS, BILATERAL 07/02/2007   Qualifier: Diagnosis of  By: Isla Pence    . Closed fracture of left femur ([place]) 10/28/2018  . Closed fracture of right ankle 11/06/2017  . Diabetes mellitus    type 2  . Emphysematous cystitis 08/26/2018  . Encounter for gynecological examination with Papanicolaou smear of cervix 01/14/2020  . Encounter for screening fecal occult blood testing 01/14/2020  . Encounter for screening for malignant neoplasm of cervix 12/29/2019  . Encounter for screening mammogram for malignant neoplasm of breast 12/29/2019  . ESRD on hemodialysis ([place])    . GERD (gastroesophageal reflux disease)   . Hyperlipidemia   . Hypertension   . Hypokalemia 08/26/2018  . IRREGULAR MENSES 09/14/2009   Qualifier: Diagnosis of  By: Hassell Done FNP, Tori Milks    . Loose stools 11/12/2019  . Normocytic anemia 08/26/2018  . PARONYCHIA, RIGHT GREAT TOE 07/30/2008   Qualifier: Diagnosis of  By: Hassell Done FNP, Tori Milks    . Pressure ulcer 09/10/2019  . Right arm weakness 08/08/2019  . Sprain of left ankle   . STEMI (ST elevation myocardial infarction) ([place]) 10/18/2019  . STEMI involving right coronary artery ([place]) 10/18/2019  . Stroke ([place])   . SVD (spontaneous vaginal delivery)    x 4  . Vaginosis 08/26/2018  . Weakness 09/10/2019  . Weakness of both lower extremities     Patient Active Problem List   Diagnosis Date Noted  . Chronic diarrhea of unknown origin 01/14/2020  . Type 2 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, with long-term current use of insulin ([place]) 12/29/2019  . Hypothyroidism 12/29/2019  . Weakness of both lower extremities 11/12/2019  . Debility 09/10/2019  . ESRD (end stage renal disease) on dialysis ([place]) 09/10/2019  . Depression 09/10/2019  . Incontinence in female 09/10/2019  . Anemia in chronic kidney disease 08/19/2019  . Accelerated hypertension 08/08/2019  . MGUS (monoclonal gammopathy of unknown significance) 07/23/2019  . GERD (gastroesophageal reflux disease) 02/28/2019  . Essential hypertension 10/28/2018  . Irregular menses 09/14/2009  . Hypercholesterolemia 11/19/2008  .  Type 2 diabetes with nephropathy ([place]) 07/02/2007    Past Surgical History:  Procedure Laterality Date  . A/V FISTULAGRAM Left 01/12/2020   Procedure: A/V FISTULAGRAM;  Surgeon: Serafina Mitchell, MD;  Location: [place] [facility];  Service: Cardiovascular;  Laterality: Left;  . A/V FISTULAGRAM N/A 03/17/2020   Procedure: A/V FISTULAGRAM - Left Arm;  Surgeon: Marty Heck, MD;  Location: [place] [facility];  Service: Cardiovascular;   Laterality: N/A;  . AV FISTULA PLACEMENT Left 08/17/2019   Procedure: LEFT BRACHIAL CEPHALIC ARTERIOVENOUS (AV) FISTULA;  Surgeon: Angelia Mould, MD;  Location: [place];  Service: Vascular;  Laterality: Left;  . CORONARY STENT INTERVENTION N/A 10/18/2019   Procedure: CORONARY STENT INTERVENTION;  Surgeon: Sherren Mocha, MD;  Location: [place] [facility];  Service: Cardiovascular;  Laterality: N/A;  . CORONARY/GRAFT ACUTE MI REVASCULARIZATION N/A 10/18/2019   Procedure: Coronary/Graft Acute MI Revascularization;  Surgeon: Sherren Mocha, MD;  Location: [place] [facility];  Service: Cardiovascular;  Laterality: N/A;  . EYE SURGERY Bilateral    cataracts removed  . FEMUR IM NAIL Left 10/28/2018   Procedure: RETROGRADE FEMORAL NAILING;  Surgeon: Meredith Pel, MD;  Location: [place];  Service: Orthopedics;  Laterality: Left;  . IM NAILING FEMORAL SHAFT RETROGRADE Left 10/28/2018  . INTRAVASCULAR ULTRASOUND/IVUS N/A 10/18/2019   Procedure: Intravascular Ultrasound/IVUS;  Surgeon: Sherren Mocha, MD;  Location: [place] [facility];  Service: Cardiovascular;  Laterality: N/A;  . IR FLUORO GUIDE CV LINE RIGHT  08/11/2019  . IR THORACENTESIS ASP PLEURAL SPACE W/IMG GUIDE  11/12/2019  . IR US GUIDE VASC ACCESS RIGHT  08/11/2019  . KNEE SURGERY Left   . LEFT HEART CATH AND CORONARY ANGIOGRAPHY N/A 10/18/2019   Procedure: LEFT HEART CATH AND CORONARY ANGIOGRAPHY;  Surgeon: Sherren Mocha, MD;  Location: [place] [facility];  Service: Cardiovascular;  Laterality: N/A;  . PERIPHERAL VASCULAR BALLOON ANGIOPLASTY Left 01/12/2020   Procedure: PERIPHERAL VASCULAR BALLOON ANGIOPLASTY;  Surgeon: Serafina Mitchell, MD;  Location: [place] [facility];  Service: Cardiovascular;  Laterality: Left;  AVF  . PERIPHERAL VASCULAR BALLOON ANGIOPLASTY Left 03/17/2020   Procedure: PERIPHERAL VASCULAR BALLOON ANGIOPLASTY;  Surgeon: Marty Heck, MD;  Location: [place] [facility];  Service: Cardiovascular;  Laterality:  Left;  AVF  . RADIOLOGY WITH ANESTHESIA N/A 09/15/2019   Procedure: [facility] AND LUMBER LOWER BACK PAIN;  Surgeon: Radiologist, Medication, MD;  Location: [place];  Service: Radiology;  Laterality: N/A;  . TUBAL LIGATION       OB History    Gravida  4   Para  4   Term  4   Preterm      AB      Living  4     SAB      TAB      Ectopic      Multiple      Live Births  4           Family History  Problem Relation Age of Onset  . Diabetes Mother   . Hypertension Mother   . Bipolar disorder Mother   . Brain cancer Maternal Aunt   . Heart disease Maternal Grandmother   . Diabetes Maternal Grandmother   . Breast cancer Maternal Aunt   . Depression Father        Committed suicide  . Diabetes Sister     Social History   Tobacco Use  . Smoking status: Former Smoker    Packs/day: 0.25  Years: 2.00    Pack years: 0.50    Types: Cigarettes    Quit date: 1997    Years since quitting: 24.7  . Smokeless tobacco: Never Used  Vaping Use  . Vaping Use: Never used  Substance Use Topics  . Alcohol use: No  . Drug use: No    Home Medications Prior to Admission medications   Medication Sig Start Date End Date Taking? Authorizing Provider  aspirin 81 MG EC tablet Take 1 tablet (81 mg total) by mouth daily. 01/13/20   Perlie Mayo, NP  atorvastatin (LIPITOR) 80 MG tablet Take 1 tablet (80 mg total) by mouth daily at 6 PM. 11/24/19   Kathlen Mody, Nicki Reaper T, PA-C  carvedilol (COREG) 6.25 MG tablet Take 1.5 tablets (9.375 mg total) by mouth 2 (two) times daily. 01/13/20   Perlie Mayo, NP  epoetin alfa (EPOGEN) 3000 UNIT/ML injection Inject 3,000 Units into the skin every 14 (fourteen) days.  06/12/19   [provider]  gabapentin (NEURONTIN) 300 MG capsule Take 1 capsule (300 mg total) by mouth at bedtime. 02/18/20   Perlie Mayo, NP  hydrALAZINE (APRESOLINE) 10 MG tablet Take 1 tablet (10 mg total) by mouth in the morning and at bedtime. 11/24/19   Richardson Dopp T, PA-C   insulin aspart protamine- aspart (NOVOLOG MIX 70/30) (70-30) 100 UNIT/ML injection Inject 0.1 mLs (10 Units total) into the skin 2 (two) times daily with a meal. 03/30/20   Perlie Mayo, NP  Insulin Syringes, Disposable, U-100 1 ML MISC Use to inject insulin twice daily dx e11.65 03/31/20   Perlie Mayo, NP  isosorbide mononitrate (IMDUR) 30 MG 24 hr tablet Take 0.5 tablets (15 mg total) by mouth daily. 01/05/20 01/04/21  Richardson Dopp T, PA-C  levothyroxine (SYNTHROID) 25 MCG tablet Take 1 tablet (25 mcg total) by mouth daily before breakfast. 02/18/20   Perlie Mayo, NP  multivitamin (RENA-VIT) TABS tablet Take 1 tablet by mouth at bedtime. 08/25/19   Hoyt Koch, MD  nitroGLYCERIN (NITROSTAT) 0.4 MG SL tablet Place 1 tablet (0.4 mg total) under the tongue every 5 (five) minutes x 3 doses as needed for chest pain. 11/24/19   Richardson Dopp T, PA-C  ondansetron (ZOFRAN ODT) 4 MG disintegrating tablet 4mg  ODT q4 hours prn nausea/vomit 02/26/20   Milton Ferguson, MD  sertraline (ZOLOFT) 25 MG tablet Take 1 tablet (25 mg total) by mouth daily. 02/18/20 03/19/20  Perlie Mayo, NP  tamsulosin (FLOMAX) 0.4 MG CAPS capsule Take 1 capsule (0.4 mg total) by mouth daily after supper. 02/18/20   Perlie Mayo, NP  ticagrelor (BRILINTA) 90 MG TABS tablet Take 1 tablet (90 mg total) by mouth 2 (two) times daily. 11/24/19   Richardson Dopp T, PA-C    Allergies    Contrast media [iodinated diagnostic agents] and Penicillins  Review of Systems   Review of Systems  Musculoskeletal: Positive for myalgias.  All other systems reviewed and are negative.   Physical Exam Updated Vital Signs BP 125/65   Pulse 80   Temp 97.8 F (36.6 C) (Oral)   Resp 20   Ht 5\' 3"  (1.6 m)   Wt 59.4 kg   LMP 12/17/2016   SpO2 98%   BMI 23.21 kg/m   Physical Exam Vitals and nursing note reviewed.  Constitutional:      General: She is not in acute distress.    Appearance: She is well-developed.     Comments:  NAD.  HENT:     Head: Normocephalic and atraumatic.     Right Ear: External ear normal.     Left Ear: External ear normal.     Nose: Nose normal.  Eyes:     General: No scleral icterus.    Conjunctiva/sclera: Conjunctivae normal.  Neck:     Comments: No midline cervical, paraspinal muscular tenderness. No supraclavicular tenderness or fullness.  Cardiovascular:     Rate and Rhythm: Normal rate and regular rhythm.     Heart sounds: Normal heart sounds. No murmur heard.      Comments: Left upper extremity fistula with good thrill, tender diffusely with firmness, discoloration most consistent with hematoma.  Posterior arm is soft, non tender without firmness. No warmth, discharge, bleeding. 1+ radial pulse.  Pulmonary:     Effort: Pulmonary effort is normal.     Breath sounds: Normal breath sounds. No wheezing.  Musculoskeletal:        General: No deformity. Normal range of motion.     Cervical back: Normal range of motion and neck supple.  Skin:    General: Skin is warm and dry.     Capillary Refill: Capillary refill takes less than 2 seconds.  Neurological:     Mental Status: She is alert and oriented to person, place, and time.     Comments: Sensation in radial, median, ulnar nerve distribution in LUE intact   Psychiatric:        Behavior: Behavior normal.        Thought Content: Thought content normal.        Judgment: Judgment normal.         ED Results / Procedures / Treatments   Labs (all labs ordered are listed, but only abnormal results are displayed) Labs Reviewed - No data to display  EKG None  Radiology No results found.  Procedures Procedures (including critical care time)  Medications Ordered in ED Medications  oxyCODONE-acetaminophen (PERCOCET/ROXICET) 5-325 MG per tablet 1 tablet (1 tablet Oral Given 04/05/20 2057)    ED Course  I have reviewed the triage vital signs and the nursing notes.  Pertinent labs & imaging results that were available  during my care of the patient were reviewed by me and considered in my medical decision making (see chart for details).    MDM Rules/Calculators/A&P                           Patient's EMR, triage and nursing notes reviewed to assist with MDM and obtain more history.  She had a hospitalization on 1/30-2/16 for CKD 5 progression to ESRD needing HD.  Had left brachiocephalic AV fistula placed by vascular surgery 2/8.  STEMI 10/2019.  Patient seen by vascular surgery earlier this month for malfunctioning left AV fistula.  Had a left arm fistulogram with angioplasty on 9/9 by Dr. Carlis Abbott.  At that time plan was to follow-up with her in 1 month for fistula duplex, vascular surgery considering new access options if complications continue.  Exam reveals ?hematoma. No distal neuro or pulse deficits. No signs of superimposed infection. No signs of steal syndrome or acute ischemia.    Discussed with EDP Zackowski. Will consult vascular surgery.   2100: Spoke to Dr Scot Dock. States sounds like patient had an infiltrate during access.  Recommends not using fistula for several weeks, use chest catheter. Recommends ice, elevation, pain control. See vascular surgery in office as needed.  Discussed plan with patient who  is in agreement. Has fu appointment with vascular surgery 10/12.  Return precautions given.  Final Clinical Impression(s) / ED Diagnoses Final diagnoses:  Left arm pain  Pain from arteriovenous fistula [facility])    Rx / DC Orders ED Discharge Orders    None       Arlean Hopping 04/05/20 2122    Fredia Sorrow, MD 04/15/20 [phone]

## 2020-04-05 NOTE — ED Triage Notes (Signed)
Pt c/o left arm upper swelling since dialysis yesterday. Pt reports she completed her dialysis yesterday and after about an hour her fistula started swelling up and they switched her dialysis to her chest catheter. Pt reports her fistula was placed 6 months ago. Pt reports dialysis nurse told her yesterday that her arm would be sore but to keep ice on it. Pt reports the swelling is not worse, but the pain is.

## 2020-04-05 NOTE — Telephone Encounter (Signed)
   Primary Cardiologist: Sherren Mocha, MD  Left voicemail for patient to call back for ongoing preoperative evaluation.  Abigail Butts, PA-C 04/05/2020, 12:51 PM

## 2020-04-05 NOTE — Discharge Instructions (Addendum)
I spoke to Dr Scot Dock, he thinks your fistula infiltrated during access today  Elevate your arm, ice, taken 1000 mg acetaminophen every 6 hours for pain  Go to your scheduled appointment on 10/12  Go to the [facility] ER for loss of sensation to your hand, coolness, tingling, worsening swelling warmth pus fevers  Do not use this left arm AV fistula for several weeks and until cleared by vascular surgery team, continue using chest catheter for hemodialysis

## 2020-04-06 ENCOUNTER — Telehealth: Payer: Self-pay | Admitting: *Deleted

## 2020-04-06 NOTE — Telephone Encounter (Addendum)
   Primary Cardiologist: Sherren Mocha, MD  Chart reviewed as part of pre-operative protocol coverage. Patient was contacted 04/06/2020 in reference to pre-operative risk assessment for pending surgery as outlined below.  Melanie Hall was last seen 12/2019 by Richardson Dopp PA-C with history of CAD with late presenting inferior STEMI 10/2019 s/p PCI to RCA, systolic CHF, ESRD, DM, HTN, HLD. Also had drug coated balloon angioplasty of left cephalic vein by vascular surgery in 01/2020. She is on ASA/Brilinta. I spoke with the patient who affirms she is doing well without any new cardiac symptoms. Therefore, based on ACC/AHA guidelines, the patient would be at acceptable risk for the planned procedure without further cardiovascular testing. However, it appears the timing of the procedure will need to be delayed from originally requested date given antiplatelet recommendations below.  Given recent PCI, clearance was sent to Dr. Burt Knack for review who states, "OK to hold brilinta x 5 days before colonoscopy, but shouldn't hold until after Apr 18, 2020. Continue ASA without interruption."   When finalizing clearance would also recommend GI team reach out to vascular for their input as well since patient has history of intervention recently by them as well.  The patient was advised that if she develops new symptoms prior to surgery to contact our office to arrange for a follow-up visit, and she verbalized understanding.  I will route this recommendation to the requesting party via Epic fax function and remove from pre-op pool. Please call with questions.  Charlie Pitter, PA-C 04/06/2020, 8:19 AM

## 2020-04-06 NOTE — Telephone Encounter (Signed)
Transition Care Management Unsuccessful Follow-up Telephone Call  Date of discharge and from where:  04/05/20 - [facility]  Attempts:  1st Attempt  Reason for unsuccessful TCM follow-up call:  Left voice message

## 2020-04-07 ENCOUNTER — Encounter (HOSPITAL_COMMUNITY): Payer: Self-pay | Admitting: Physical Therapy

## 2020-04-07 ENCOUNTER — Other Ambulatory Visit: Payer: Self-pay

## 2020-04-07 ENCOUNTER — Ambulatory Visit (HOSPITAL_COMMUNITY): Payer: Medicaid Other | Admitting: Physical Therapy

## 2020-04-07 DIAGNOSIS — N186 End stage renal disease: Secondary | ICD-10-CM | POA: Diagnosis not present

## 2020-04-07 DIAGNOSIS — M6281 Muscle weakness (generalized): Secondary | ICD-10-CM

## 2020-04-07 DIAGNOSIS — R296 Repeated falls: Secondary | ICD-10-CM | POA: Diagnosis not present

## 2020-04-07 DIAGNOSIS — M545 Low back pain: Secondary | ICD-10-CM | POA: Diagnosis not present

## 2020-04-07 DIAGNOSIS — Z992 Dependence on renal dialysis: Secondary | ICD-10-CM | POA: Diagnosis not present

## 2020-04-07 DIAGNOSIS — G8929 Other chronic pain: Secondary | ICD-10-CM

## 2020-04-07 DIAGNOSIS — R262 Difficulty in walking, not elsewhere classified: Secondary | ICD-10-CM | POA: Diagnosis not present

## 2020-04-07 DIAGNOSIS — S8252XA Displaced fracture of medial malleolus of left tibia, initial encounter for closed fracture: Secondary | ICD-10-CM | POA: Diagnosis not present

## 2020-04-07 NOTE — Telephone Encounter (Signed)
Transition Care Management Unsuccessful Follow-up Telephone Call  Date of discharge and from where:  04/05/20 - [facility]  Attempts:  2nd Attempt  Reason for unsuccessful TCM follow-up call:  Left voice message

## 2020-04-07 NOTE — Therapy (Signed)
[place], [place], [address] Phone: [phone]   Fax:  [phone]  Physical Therapy Treatment  Patient Details  Name: Melanie Hall MRN: [id number] Date of Birth: 1970-09-14 Referring Provider (PT): Cherly Beach    Encounter Date: 04/07/2020   PT End of Session - 04/07/20 1446    Visit Number 15    Number of Visits 21    Date for PT Re-Evaluation 04/29/20    Authorization Type [employer] check auth submitted for 8 more visits on 03/31/20    Authorization - Visit Number 2    Authorization - Number of Visits 8    Progress Note Due on Visit 20    PT Start Time 6962    PT Stop Time 1526    PT Time Calculation (min) 39 min    Activity Tolerance Patient tolerated treatment well    Behavior During Therapy [facility] for tasks assessed/performed           Past Medical History:  Diagnosis Date  . Acute cystitis without hematuria   . Acute kidney failure ([place]) 02/28/2019  . Acute midline thoracic back pain 12/29/2019  . Acute on chronic renal failure ([place]) 02/28/2019  . Acute systolic heart failure ([place]) 10/21/2019  . Anemia   . Cataracts, bilateral    surgery to remove  . CATARACTS, BILATERAL 07/02/2007   Qualifier: Diagnosis of  By: Isla Pence    . Closed fracture of left femur ([place]) 10/28/2018  . Closed fracture of right ankle 11/06/2017  . Diabetes mellitus    type 2  . Emphysematous cystitis 08/26/2018  . Encounter for gynecological examination with Papanicolaou smear of cervix 01/14/2020  . Encounter for screening fecal occult blood testing 01/14/2020  . Encounter for screening for malignant neoplasm of cervix 12/29/2019  . Encounter for screening mammogram for malignant neoplasm of breast 12/29/2019  . ESRD on hemodialysis ([place])   . GERD (gastroesophageal reflux disease)   . Hyperlipidemia   . Hypertension   . Hypokalemia 08/26/2018  . IRREGULAR MENSES 09/14/2009   Qualifier: Diagnosis of  By: Hassell Done FNP,  Tori Milks    . Loose stools 11/12/2019  . Normocytic anemia 08/26/2018  . PARONYCHIA, RIGHT GREAT TOE 07/30/2008   Qualifier: Diagnosis of  By: Hassell Done FNP, Tori Milks    . Pressure ulcer 09/10/2019  . Right arm weakness 08/08/2019  . Sprain of left ankle   . STEMI (ST elevation myocardial infarction) ([place]) 10/18/2019  . STEMI involving right coronary artery ([place]) 10/18/2019  . Stroke ([place])   . SVD (spontaneous vaginal delivery)    x 4  . Vaginosis 08/26/2018  . Weakness 09/10/2019  . Weakness of both lower extremities     Past Surgical History:  Procedure Laterality Date  . A/V FISTULAGRAM Left 01/12/2020   Procedure: A/V FISTULAGRAM;  Surgeon: Serafina Mitchell, MD;  Location: [place] [facility];  Service: Cardiovascular;  Laterality: Left;  . A/V FISTULAGRAM N/A 03/17/2020   Procedure: A/V FISTULAGRAM - Left Arm;  Surgeon: Marty Heck, MD;  Location: [place] [facility];  Service: Cardiovascular;  Laterality: N/A;  . AV FISTULA PLACEMENT Left 08/17/2019   Procedure: LEFT BRACHIAL CEPHALIC ARTERIOVENOUS (AV) FISTULA;  Surgeon: Angelia Mould, MD;  Location: [place];  Service: Vascular;  Laterality: Left;  . CORONARY STENT INTERVENTION N/A 10/18/2019   Procedure: CORONARY STENT INTERVENTION;  Surgeon: Sherren Mocha, MD;  Location: [place] [facility];  Service: Cardiovascular;  Laterality: N/A;  .  CORONARY/GRAFT ACUTE MI REVASCULARIZATION N/A 10/18/2019   Procedure: Coronary/Graft Acute MI Revascularization;  Surgeon: Sherren Mocha, MD;  Location: [place] [facility];  Service: Cardiovascular;  Laterality: N/A;  . EYE SURGERY Bilateral    cataracts removed  . FEMUR IM NAIL Left 10/28/2018   Procedure: RETROGRADE FEMORAL NAILING;  Surgeon: Meredith Pel, MD;  Location: [place];  Service: Orthopedics;  Laterality: Left;  . IM NAILING FEMORAL SHAFT RETROGRADE Left 10/28/2018  . INTRAVASCULAR ULTRASOUND/IVUS N/A 10/18/2019   Procedure: Intravascular Ultrasound/IVUS;  Surgeon: Sherren Mocha, MD;  Location: [place] [facility];  Service: Cardiovascular;  Laterality: N/A;  . IR FLUORO GUIDE CV LINE RIGHT  08/11/2019  . IR THORACENTESIS ASP PLEURAL SPACE W/IMG GUIDE  11/12/2019  . IR US GUIDE VASC ACCESS RIGHT  08/11/2019  . KNEE SURGERY Left   . LEFT HEART CATH AND CORONARY ANGIOGRAPHY N/A 10/18/2019   Procedure: LEFT HEART CATH AND CORONARY ANGIOGRAPHY;  Surgeon: Sherren Mocha, MD;  Location: [place] [facility];  Service: Cardiovascular;  Laterality: N/A;  . PERIPHERAL VASCULAR BALLOON ANGIOPLASTY Left 01/12/2020   Procedure: PERIPHERAL VASCULAR BALLOON ANGIOPLASTY;  Surgeon: Serafina Mitchell, MD;  Location: [place] [facility];  Service: Cardiovascular;  Laterality: Left;  AVF  . PERIPHERAL VASCULAR BALLOON ANGIOPLASTY Left 03/17/2020   Procedure: PERIPHERAL VASCULAR BALLOON ANGIOPLASTY;  Surgeon: Marty Heck, MD;  Location: [place] [facility];  Service: Cardiovascular;  Laterality: Left;  AVF  . RADIOLOGY WITH ANESTHESIA N/A 09/15/2019   Procedure: [facility] AND LUMBER LOWER BACK PAIN;  Surgeon: Radiologist, Medication, MD;  Location: [place];  Service: Radiology;  Laterality: N/A;  . TUBAL LIGATION      There were no vitals filed for this visit.   Subjective Assessment - 04/07/20 1447    Subjective Patient states her arm is still bothering her. Patient states her exercises have been alright.    Pertinent History HTN,DM, CVA, femoral nailing 10/2018, LE weakness on dialysis (MWF)    Limitations Lifting;Standing;Walking;House hold activities    How long can you sit comfortably? no problem    How long can you walk comfortably? 3 hours using a walker or cart.    Patient Stated Goals to be off her walker    Currently in Pain? Yes    Pain Score --   "15/10"   Pain Location Back    Pain Onset More than a month ago                             [facility] Adult PT Treatment/Exercise - 04/07/20 0001      Knee/Hip Exercises: Stretches   Gastroc Stretch 3 reps;10  seconds;Both      Knee/Hip Exercises: Standing   Heel Raises 20 reps    Knee Flexion 2 sets;10 reps    Hip Abduction 20 reps    Lateral Step Up Step Height: 4";10 reps;Hand Hold: 2    Lateral Step Up Limitations eccentric lowering    Forward Step Up Both;10 reps;Step Height: 4";2 sets;Hand Hold: 2                  PT Education - 04/07/20 1446    Education Details Patient educated on HEP and exercise mechanics    Person(s) Educated Patient    Methods Explanation;Demonstration    Comprehension Verbalized understanding;Returned demonstration            PT Short Term Goals - 03/31/20 1616  PT SHORT TERM GOAL #1   Title Pt to be I in her HEP to improve her functioning level.    Baseline reports adherence to HEP    Time 4    Period Weeks    Status Achieved    Target Date 02/19/20      PT SHORT TERM GOAL #2   Title PT core and LE strength increased one grade to allow pt to be able to come sit to stand from a chair without the use of her UE>    Baseline Improved strength but still requires UEs for sit/stand    Time 4    Period Weeks    Status On-going      PT SHORT TERM GOAL #3   Title PT to be able to single leg stance on both LE for at least 20 seconds to allow her to feel confident walking in her home with a cane.    Baseline RT 5 sec, LT unable    Time 4    Period Weeks    Status On-going             PT Long Term Goals - 03/31/20 1618      PT LONG TERM GOAL #1   Title PT to be completing an advanced HEP to allow pt functional ability to increase.    Time 8    Period Weeks    Status On-going      PT LONG TERM GOAL #2   Title PT core and LE strength to be at least a 4+ to allow pt to squat down and pick items off the floor for housework/yardwork and to go up and down 8 steps    Baseline See MMT    Time 8    Period Weeks    Status On-going      PT LONG TERM GOAL #3   Title Pt to be able to single leg stance for at least 30 seconds to allow pt to  feel confident walking in her home without an assistive device.    Baseline Unable    Time 8    Period Weeks    Status On-going                 Plan - 04/07/20 1447    Clinical Impression Statement Patient completes HR and knee flexion exercise with heavy UE support for balance and LE weakness. She requires several seated rest breaks throughout session. She c/o of calf tightness which improves following calf stretches. She is able to complete step exercise on 4 inch step today and requires bilateral UE support. Patient has difficulty with eccentric lowering on lateral step down secondary to weakness despite cueing for eccentric control. Patient will continue to benefit from skilled physical therapy in order to reduce impairment and improve function.    Personal Factors and Comorbidities Past/Current Experience;Fitness;Time since onset of injury/illness/exacerbation    Examination-Activity Limitations Carry;Dressing;Lift;Locomotion Level;Stairs;Squat    Examination-Participation Restrictions Cleaning;Community Activity;Laundry;Shop    Stability/Clinical Decision Making Stable/Uncomplicated    Rehab Potential Good    PT Frequency 2x / week    PT Duration 4 weeks    PT Treatment/Interventions Therapeutic activities;Therapeutic exercise;Balance training;Neuromuscular re-education;Stair training;Functional mobility training;Gait training;Patient/family education;Manual techniques    PT Next Visit Plan balance, and functional strengthening as tolerated.    PT Home Exercise Plan heel raises, sit to stand, squats and bridges; 7/20 LAQs    Consulted and Agree with Plan of Care Patient  Patient will benefit from skilled therapeutic intervention in order to improve the following deficits and impairments:  Abnormal gait, Decreased activity tolerance, Decreased balance, Difficulty walking, Decreased strength, Pain  Visit Diagnosis: Difficulty in walking, not elsewhere  classified  Repeated falls  Chronic midline low back pain without sciatica  Muscle weakness (generalized)     Problem List Patient Active Problem List   Diagnosis Date Noted  . Chronic diarrhea of unknown origin 01/14/2020  . Type 2 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, with long-term current use of insulin ([place]) 12/29/2019  . Hypothyroidism 12/29/2019  . Weakness of both lower extremities 11/12/2019  . Debility 09/10/2019  . ESRD (end stage renal disease) on dialysis ([place]) 09/10/2019  . Depression 09/10/2019  . Incontinence in female 09/10/2019  . Anemia in chronic kidney disease 08/19/2019  . Accelerated hypertension 08/08/2019  . MGUS (monoclonal gammopathy of unknown significance) 07/23/2019  . GERD (gastroesophageal reflux disease) 02/28/2019  . Essential hypertension 10/28/2018  . Irregular menses 09/14/2009  . Hypercholesterolemia 11/19/2008  . Type 2 diabetes with nephropathy ([place]) 07/02/2007    3:26 PM, 04/07/20 Mearl Latin PT, DPT Physical Therapist at [place], [place], [address] Phone: [phone]   Fax:  [phone]  Name: Melanie Hall MRN: [id number] Date of Birth: 1971-04-14

## 2020-04-08 NOTE — Telephone Encounter (Signed)
Transition Care Management Unsuccessful Follow-up Telephone Call  Date of discharge and from where:  04/05/20 - [facility]  Attempts:  3rd Attempt  Reason for unsuccessful TCM follow-up call:  Left voice message

## 2020-04-12 ENCOUNTER — Other Ambulatory Visit (HOSPITAL_COMMUNITY)
Admission: RE | Admit: 2020-04-12 | Discharge: 2020-04-12 | Disposition: A | Discharge status: Home / Self Care | Payer: Medicaid Other | Source: Ambulatory Visit | Attending: Gastroenterology | Admitting: Gastroenterology

## 2020-04-12 ENCOUNTER — Telehealth (HOSPITAL_COMMUNITY): Payer: Self-pay | Admitting: Physical Therapy

## 2020-04-12 ENCOUNTER — Ambulatory Visit (HOSPITAL_COMMUNITY): Payer: Medicaid Other | Attending: Family Medicine | Admitting: Physical Therapy

## 2020-04-12 NOTE — Patient Instructions (Signed)
Melanie Hall  04/12/2020     @PREFPERIOPPHARMACY @   Your procedure is scheduled on  04/15/2020.  Report to [facility] at  1000  A.M.  Call this number if you have problems the morning of surgery:  [phone]   Remember:  Follow the diet and prep instructions given to you by the office.                     Take these medicines the morning of surgery with A SIP OF WATER  Carvedilol, gabapentin, isosorbide, levothyroxine, zofran(if needed), zoloft, flomax. Take 1/2 of your night time insulin the night before your procedure. DO NOT take any medications for diabetes the morning of your procedure.    Do not wear jewelry, make-up or nail polish.  Do not wear lotions, powders, or perfumes. Please wear deodorant and brush your teeth.  Do not shave 48 hours prior to surgery.  Men may shave face and neck.  Do not bring valuables to the hospital.  [facility] is not responsible for any belongings or valuables.  Contacts, dentures or bridgework may not be worn into surgery.  Leave your suitcase in the car.  After surgery it may be brought to your room.  For patients admitted to the hospital, discharge time will be determined by your treatment team.  Patients discharged the day of surgery will not be allowed to drive home.   Name and phone number of your driver:   family Special instructions:   DO NOT smoke the morning of your procedure.  Please read over the following fact sheets that you were given. Anesthesia Post-op Instructions and Care and Recovery After Surgery       Colonoscopy, Adult, Care After This sheet gives you information about how to care for yourself after your procedure. Your health care provider may also give you more specific instructions. If you have problems or questions, contact your health care provider. What can I expect after the procedure? After the procedure, it is common to have:  A small amount of blood in your stool for 24 hours after the  procedure.  Some gas.  Mild cramping or bloating of your abdomen. Follow these instructions at home: Eating and drinking   Drink enough fluid to keep your urine pale yellow.  Follow instructions from your health care provider about eating or drinking restrictions.  Resume your normal diet as instructed by your health care provider. Avoid heavy or fried foods that are hard to digest. Activity  Rest as told by your health care provider.  Avoid sitting for a long time without moving. Get up to take short walks every 1-2 hours. This is important to improve blood flow and breathing. Ask for help if you feel weak or unsteady.  Return to your normal activities as told by your health care provider. Ask your health care provider what activities are safe for you. Managing cramping and bloating   Try walking around when you have cramps or feel bloated.  Apply heat to your abdomen as told by your health care provider. Use the heat source that your health care provider recommends, such as a moist heat pack or a heating pad. ? Place a towel between your skin and the heat source. ? Leave the heat on for 20-30 minutes. ? Remove the heat if your skin turns bright red. This is especially important if you are unable to feel pain, heat, or cold. You may  have a greater risk of getting burned. General instructions  For the first 24 hours after the procedure: ? Do not drive or use machinery. ? Do not sign important documents. ? Do not drink alcohol. ? Do your regular daily activities at a slower pace than normal. ? Eat soft foods that are easy to digest.  Take over-the-counter and prescription medicines only as told by your health care provider.  Keep all follow-up visits as told by your health care provider. This is important. Contact a health care provider if:  You have blood in your stool 2-3 days after the procedure. Get help right away if you have:  More than a small spotting of blood in  your stool.  Large blood clots in your stool.  Swelling of your abdomen.  Nausea or vomiting.  A fever.  Increasing pain in your abdomen that is not relieved with medicine. Summary  After the procedure, it is common to have a small amount of blood in your stool. You may also have mild cramping and bloating of your abdomen.  For the first 24 hours after the procedure, do not drive or use machinery, sign important documents, or drink alcohol.  Get help right away if you have a lot of blood in your stool, nausea or vomiting, a fever, or increased pain in your abdomen. This information is not intended to replace advice given to you by your health care provider. Make sure you discuss any questions you have with your health care provider. Document Revised: 01/19/2019 Document Reviewed: 01/19/2019 Elsevier Patient Education  [place] After These instructions provide you with information about caring for yourself after your procedure. Your health care provider may also give you more specific instructions. Your treatment has been planned according to current medical practices, but problems sometimes occur. Call your health care provider if you have any problems or questions after your procedure. What can I expect after the procedure? After your procedure, you may:  Feel sleepy for several hours.  Feel clumsy and have poor balance for several hours.  Feel forgetful about what happened after the procedure.  Have poor judgment for several hours.  Feel nauseous or vomit.  Have a sore throat if you had a breathing tube during the procedure. Follow these instructions at home: For at least 24 hours after the procedure:      Have a responsible adult stay with you. It is important to have someone help care for you until you are awake and alert.  Rest as needed.  Do not: ? Participate in activities in which you could fall or become  injured. ? Drive. ? Use heavy machinery. ? Drink alcohol. ? Take sleeping pills or medicines that cause drowsiness. ? Make important decisions or sign legal documents. ? Take care of children on your own. Eating and drinking  Follow the diet that is recommended by your health care provider.  If you vomit, drink water, juice, or soup when you can drink without vomiting.  Make sure you have little or no nausea before eating solid foods. General instructions  Take over-the-counter and prescription medicines only as told by your health care provider.  If you have sleep apnea, surgery and certain medicines can increase your risk for breathing problems. Follow instructions from your health care provider about wearing your sleep device: ? Anytime you are sleeping, including during daytime naps. ? While taking prescription pain medicines, sleeping medicines, or medicines that make you drowsy.  If you smoke, do not smoke without supervision.  Keep all follow-up visits as told by your health care provider. This is important. Contact a health care provider if:  You keep feeling nauseous or you keep vomiting.  You feel light-headed.  You develop a rash.  You have a fever. Get help right away if:  You have trouble breathing. Summary  For several hours after your procedure, you may feel sleepy and have poor judgment.  Have a responsible adult stay with you for at least 24 hours or until you are awake and alert. This information is not intended to replace advice given to you by your health care provider. Make sure you discuss any questions you have with your health care provider. Document Revised: 09/23/2017 Document Reviewed: 10/16/2015 Elsevier Patient Education  [place].

## 2020-04-12 NOTE — Telephone Encounter (Signed)
No Show. Called and left voicemail about missed apt and reminded patient of next apt and to call and cancel if they cannot make it.   3:54 PM, 04/12/20 Jerene Pitch, DPT Physical Therapy with [facility]  [phone] office

## 2020-04-13 ENCOUNTER — Other Ambulatory Visit: Payer: Self-pay

## 2020-04-13 ENCOUNTER — Encounter (HOSPITAL_COMMUNITY)
Admission: RE | Admit: 2020-04-13 | Discharge: 2020-04-13 | Disposition: A | Discharge status: Home / Self Care | Payer: Medicaid Other | Source: Ambulatory Visit | Attending: Gastroenterology | Admitting: Gastroenterology

## 2020-04-14 ENCOUNTER — Ambulatory Visit: Payer: Medicaid Other | Admitting: Family Medicine

## 2020-04-14 ENCOUNTER — Telehealth (INDEPENDENT_AMBULATORY_CARE_PROVIDER_SITE_OTHER): Payer: Self-pay | Admitting: *Deleted

## 2020-04-14 ENCOUNTER — Telehealth (HOSPITAL_COMMUNITY): Payer: Self-pay | Admitting: Physical Therapy

## 2020-04-14 ENCOUNTER — Ambulatory Visit (HOSPITAL_COMMUNITY): Payer: Medicaid Other | Admitting: Physical Therapy

## 2020-04-14 NOTE — Telephone Encounter (Signed)
Thanks, noted

## 2020-04-14 NOTE — Telephone Encounter (Signed)
FYI: patient did not show for COIVD test - procedure has been cancelled

## 2020-04-14 NOTE — Telephone Encounter (Signed)
No Show #2. Called and left voicemail about cancellation policy and reminded patient of upcoming appointment. Left detailed message about canceling all but next appointment and how patient will be able to schedule one appointment at a time if she returns for her follow up appointment. If patient does not show up to next appointment, patient will be discharged from physical therapy secondary to our no show/cancellation policy.   3:54 PM, 04/14/20 Jerene Pitch, DPT Physical Therapy with [facility]  [phone] office

## 2020-04-15 ENCOUNTER — Ambulatory Visit (HOSPITAL_COMMUNITY): Admission: RE | Admit: 2020-04-15 | Payer: Medicaid Other | Source: Home / Self Care | Admitting: Gastroenterology

## 2020-04-15 ENCOUNTER — Encounter (HOSPITAL_COMMUNITY): Admission: RE | Payer: Self-pay | Source: Home / Self Care

## 2020-04-15 SURGERY — COLONOSCOPY WITH PROPOFOL
Anesthesia: Monitor Anesthesia Care

## 2020-04-19 ENCOUNTER — Telehealth (HOSPITAL_COMMUNITY): Payer: Self-pay | Admitting: Physical Therapy

## 2020-04-19 ENCOUNTER — Ambulatory Visit (HOSPITAL_COMMUNITY)
Admission: RE | Admit: 2020-04-19 | Discharge: 2020-04-19 | Disposition: A | Discharge status: Home / Self Care | Payer: Medicaid Other | Source: Ambulatory Visit | Attending: Family Medicine | Admitting: Family Medicine

## 2020-04-19 ENCOUNTER — Ambulatory Visit (INDEPENDENT_AMBULATORY_CARE_PROVIDER_SITE_OTHER): Payer: Medicaid Other | Admitting: Physician Assistant

## 2020-04-19 ENCOUNTER — Other Ambulatory Visit: Payer: Self-pay

## 2020-04-19 ENCOUNTER — Encounter: Payer: Self-pay | Admitting: Physician Assistant

## 2020-04-19 ENCOUNTER — Ambulatory Visit (HOSPITAL_COMMUNITY): Payer: Medicaid Other | Admitting: Physical Therapy

## 2020-04-19 VITALS — BP 123/69 | HR 66 | Temp 98.1°F | Resp 20 | Ht 63.0 in | Wt 137.9 lb

## 2020-04-19 DIAGNOSIS — N186 End stage renal disease: Secondary | ICD-10-CM | POA: Insufficient documentation

## 2020-04-19 DIAGNOSIS — Z992 Dependence on renal dialysis: Secondary | ICD-10-CM

## 2020-04-19 MED ORDER — SODIUM CHLORIDE 0.9% FLUSH: 3.0000 mL | Freq: Two times a day (BID) | INTRAVENOUS | Status: AC

## 2020-04-19 MED ORDER — SODIUM CHLORIDE 0.9 % IV SOLN: 250.0000 mL | INTRAVENOUS | Status: AC | PRN

## 2020-04-19 MED ORDER — SODIUM CHLORIDE 0.9% FLUSH: 3.0000 mL | INTRAVENOUS | Status: AC | PRN

## 2020-04-19 NOTE — Progress Notes (Signed)
Established Dialysis Access   History of Present Illness   Melanie Hall is a 49 y.o. ([birth year]) female who presents for evaluation of left upper extremity pain. She most recently underwent a AV fistulogram with Dr. Carlis Abbott on 03/17/20 and was found to have cephalic vein stenosis which was treated with [facility] angioplasty. She says following this procedure she was instructed that her fistula could be used at dialysis. The first time it was used it worked fine but then the second time they had to stop using her arm because it infiltrated. Since it infiltrated she has been getting sharp pains in her left upper arm and occasionally in her left thumb. This is worse when she extends her arm. She continues to have a tender "knot" in the upper arm as well. She since has been using her catheter for dialysis. She otherwise denies any numbness, coldness, or ischemic changes to her left arm or hand.  She currently is dialyzing via a left IJ TDC on MWF at [facility] [address] in [place]  Current Outpatient Medications  Medication Sig Dispense Refill  . aspirin 81 MG EC tablet Take 1 tablet (81 mg total) by mouth daily. 90 tablet 3  . atorvastatin (LIPITOR) 80 MG tablet Take 1 tablet (80 mg total) by mouth daily at 6 PM. 90 tablet 3  . carvedilol (COREG) 6.25 MG tablet Take 1.5 tablets (9.375 mg total) by mouth 2 (two) times daily. 180 tablet 3  . epoetin alfa (EPOGEN) 3000 UNIT/ML injection Inject 3,000 Units into the skin every 14 (fourteen) days.     Marland Kitchen gabapentin (NEURONTIN) 300 MG capsule Take 1 capsule (300 mg total) by mouth at bedtime. 90 capsule 0  . hydrALAZINE (APRESOLINE) 10 MG tablet Take 1 tablet (10 mg total) by mouth in the morning and at bedtime. 180 tablet 3  . insulin aspart protamine- aspart (NOVOLOG MIX 70/30) (70-30) 100 UNIT/ML injection Inject 0.1 mLs (10 Units total) into the skin 2 (two) times daily with a meal. 10 mL 3  . Insulin Syringes, Disposable, U-100 1 ML MISC Use to inject insulin twice  daily dx e11.65 100 each 5  . isosorbide mononitrate (IMDUR) 30 MG 24 hr tablet Take 0.5 tablets (15 mg total) by mouth daily. 15 tablet 11  . levothyroxine (SYNTHROID) 25 MCG tablet Take 1 tablet (25 mcg total) by mouth daily before breakfast. 30 tablet 1  . multivitamin (RENA-VIT) TABS tablet Take 1 tablet by mouth at bedtime. 30 tablet 0  . nitroGLYCERIN (NITROSTAT) 0.4 MG SL tablet Place 1 tablet (0.4 mg total) under the tongue every 5 (five) minutes x 3 doses as needed for chest pain. 25 tablet 3  . ondansetron (ZOFRAN ODT) 4 MG disintegrating tablet 4mg  ODT q4 hours prn nausea/vomit 12 tablet 0  . tamsulosin (FLOMAX) 0.4 MG CAPS capsule Take 1 capsule (0.4 mg total) by mouth daily after supper. 90 capsule 0  . ticagrelor (BRILINTA) 90 MG TABS tablet Take 1 tablet (90 mg total) by mouth 2 (two) times daily. 180 tablet 3  . sertraline (ZOLOFT) 25 MG tablet Take 1 tablet (25 mg total) by mouth daily. 30 tablet 0   Current Facility-Administered Medications  Medication Dose Route Frequency Provider Last Rate Last Admin  . 0.9 %  sodium chloride infusion  250 mL Intravenous PRN Marty Heck, MD        On ROS today: negative unless stated in HPI   Physical Examination   Vitals:   04/19/20 1101  BP: 123/69  Pulse: 66  Resp: 20  Temp: 98.1 F (36.7 C)  TempSrc: Temporal  SpO2: 100%  Weight: 137 lb 14.4 oz (62.6 kg)  Height: 5\' 3"  (1.6 m)   Body mass index is 24.43 kg/m.  General Well appearing, well nourished, not in any distress  Pulmonary Non labored   Cardiac Regular rate and rhythm. Left chest wall TDC in place.  Vascular Vessel Right Left  Radial Palpable Palpable  Brachial Palpable Palpable  Ulnar Faintly palpable Faintly palpable    Musculo- skeletal Left upper arm fistula with good thrill, pulsatile. There is tender area in left upper arm likely hematoma vs edema secondary to infiltration M/S 5/5 throughout  , Extremities without ischemic changes      Neurologic A&O; CN grossly intact     Non-invasive Vascular Imaging  Findings:  +--------------------+----------+-----------------+--------+  AVF         PSV (cm/s)Flow Vol (mL/min)Comments  +--------------------+----------+-----------------+--------+  Native artery inflow  221     1859          +--------------------+----------+-----------------+--------+  AVF Anastomosis     680                 +--------------------+----------+-----------------+--------+   +---------------+----------+-------------+----------+--------+  OUTFLOW VEIN  PSV (cm/s)Diameter (cm)Depth (cm)Describe  +---------------+----------+-------------+----------+--------+  Subclavian vein  308                    +---------------+----------+-------------+----------+--------+  Confluence     598              stenotic  +---------------+----------+-------------+----------+--------+  Shoulder      338              stenotic  +---------------+----------+-------------+----------+--------+  Prox UA      110    0.92     0.34        +---------------+----------+-------------+----------+--------+  Mid UA       129    1.10     0.24        +---------------+----------+-------------+----------+--------+  Dist UA      174    0.75     0.11        +---------------+----------+-------------+----------+--------+  AC Fossa      375    0.89     0.18        +---------------+----------+-------------+----------+--------+  Patent brachio-cephalic AVF with highest elevated velocities at the  confluence of the cephalic and subclavian veins suggestive of >50%  stenosis.    Summary:  Arteriovenous fistula-Elevated velocities and a velocity ratio of greater than 3  Noted. Elevated velocities at the shoulder and confluence with  the subclavian  vein suggest stenosis.    Medical Decision Making   [place] is a 49 y.o. female who presents with left upper arm pain. Fistula duplex shows recurrence of central venous stenosis. Discussed with patient that would recommend addressing recurrence of her stenosis. She may need a stent placed since her stenosis has recurred very quickly. I would continue to let the left upper extremity rest and use TDC in the mean time to give arm a break following infiltration. She can use warm compresses and Tylenol PRN. Reviewed imaging and patient with Dr. Carlis Abbott and he is agreeable with this plan. I discussed with her and patient is understanding that if her pain worsens or she continues to have issues with this fistula it may need to be ligated and she will need to have new fistula or graft placed - I have scheduled her for  a left upper extremity fistulogram. This can be done at next earliest availability. She dialyzes MWF in [place] so will try to have this scheduled on non dialysis day but dialysis schedule can be changed if necessary. She also is on Brillinta which should not need held for fistulogram.  - For follow up patient would prefer to go to [place] to see Dr. Donnetta Hutching due to transportation in the future  Karoline Caldwell, PA-C [facility] Office: [phone]  Clinic MD: Dr. Carlis Abbott

## 2020-04-19 NOTE — Telephone Encounter (Signed)
Pt did not show for appt (3rd consecutive NS).  Informed of NS policy and that she would be discharged from therapy at this point. Explained she must obtain a new order from MD if she would like to return.  Teena Irani, PTA/CLT [phone]

## 2020-04-20 ENCOUNTER — Other Ambulatory Visit: Payer: Self-pay | Admitting: *Deleted

## 2020-04-20 ENCOUNTER — Encounter (HOSPITAL_COMMUNITY): Payer: Self-pay | Admitting: Physical Therapy

## 2020-04-20 DIAGNOSIS — N186 End stage renal disease: Secondary | ICD-10-CM

## 2020-04-20 NOTE — Therapy (Unsigned)
[facility] [place], [place], [address] Phone: [phone]   Fax:  [phone]  Patient Details  Name: MARSHAL SCHRECENGOST MRN: [id number] Date of Birth: [birth year] Referring Provider:  No ref. provider found  Encounter Date: 04/20/2020   PHYSICAL THERAPY DISCHARGE SUMMARY  Visits from Start of Care: 15  Current functional level related to goals / functional outcomes: unknown   Remaining deficits: Unknown    Education / Equipment: HEP Plan: Patient agrees to discharge.  Patient goals were partially met. Patient is being discharged due to not returning since the last visit.  ?????      Rayetta Humphrey, PT CLT [phone] 04/20/2020, 8:50 AM  [facility] [address] [place], [place], [address] Phone: [phone]   Fax:  [phone]

## 2020-04-21 ENCOUNTER — Encounter (HOSPITAL_COMMUNITY): Payer: Medicaid Other | Admitting: Physical Therapy

## 2020-04-26 ENCOUNTER — Encounter (HOSPITAL_COMMUNITY): Payer: Medicaid Other | Admitting: Physical Therapy

## 2020-04-26 DIAGNOSIS — K529 Noninfective gastroenteritis and colitis, unspecified: Secondary | ICD-10-CM | POA: Diagnosis not present

## 2020-04-28 ENCOUNTER — Encounter: Payer: Self-pay | Admitting: Family Medicine

## 2020-04-28 ENCOUNTER — Encounter (HOSPITAL_COMMUNITY): Payer: Medicaid Other | Admitting: Physical Therapy

## 2020-04-28 DIAGNOSIS — E113413 Type 2 diabetes mellitus with severe nonproliferative diabetic retinopathy with macular edema, bilateral: Secondary | ICD-10-CM | POA: Diagnosis not present

## 2020-04-28 LAB — HM DIABETES EYE EXAM

## 2020-04-29 ENCOUNTER — Ambulatory Visit: Payer: Medicaid Other | Admitting: Family Medicine

## 2020-04-29 LAB — OVA AND PARASITE EXAMINATION
CONCENTRATE RESULT:: NONE SEEN
MICRO NUMBER:: 11090776
SPECIMEN QUALITY:: ADEQUATE
TRICHROME RESULT:: NONE SEEN

## 2020-05-01 ENCOUNTER — Other Ambulatory Visit: Payer: Self-pay | Admitting: Physician Assistant

## 2020-05-03 ENCOUNTER — Other Ambulatory Visit (HOSPITAL_COMMUNITY)
Admission: RE | Admit: 2020-05-03 | Discharge: 2020-05-03 | Disposition: A | Discharge status: Home / Self Care | Payer: Medicaid Other | Source: Ambulatory Visit | Attending: Vascular Surgery | Admitting: Vascular Surgery

## 2020-05-03 DIAGNOSIS — H35033 Hypertensive retinopathy, bilateral: Secondary | ICD-10-CM | POA: Diagnosis not present

## 2020-05-03 DIAGNOSIS — Z20822 Contact with and (suspected) exposure to covid-19: Secondary | ICD-10-CM | POA: Insufficient documentation

## 2020-05-03 DIAGNOSIS — E113513 Type 2 diabetes mellitus with proliferative diabetic retinopathy with macular edema, bilateral: Secondary | ICD-10-CM | POA: Diagnosis not present

## 2020-05-03 DIAGNOSIS — H4312 Vitreous hemorrhage, left eye: Secondary | ICD-10-CM | POA: Diagnosis not present

## 2020-05-03 DIAGNOSIS — Z01812 Encounter for preprocedural laboratory examination: Secondary | ICD-10-CM | POA: Insufficient documentation

## 2020-05-03 DIAGNOSIS — H3563 Retinal hemorrhage, bilateral: Secondary | ICD-10-CM | POA: Diagnosis not present

## 2020-05-03 LAB — SARS CORONAVIRUS 2 (TAT 6-24 HRS): SARS Coronavirus 2: NEGATIVE

## 2020-05-04 DIAGNOSIS — E11319 Type 2 diabetes mellitus with unspecified diabetic retinopathy without macular edema: Secondary | ICD-10-CM | POA: Insufficient documentation

## 2020-05-05 ENCOUNTER — Other Ambulatory Visit: Payer: Self-pay

## 2020-05-05 ENCOUNTER — Ambulatory Visit (HOSPITAL_COMMUNITY)
Admission: RE | Admit: 2020-05-05 | Discharge: 2020-05-05 | Disposition: A | Discharge status: Home / Self Care | Payer: Medicaid Other | Attending: Vascular Surgery | Admitting: Vascular Surgery

## 2020-05-05 ENCOUNTER — Ambulatory Visit (HOSPITAL_COMMUNITY)
Admission: RE | Disposition: A | Discharge status: Home / Self Care | Payer: Self-pay | Source: Home / Self Care | Attending: Vascular Surgery

## 2020-05-05 DIAGNOSIS — Z880 Allergy status to penicillin: Secondary | ICD-10-CM | POA: Insufficient documentation

## 2020-05-05 DIAGNOSIS — N186 End stage renal disease: Secondary | ICD-10-CM | POA: Insufficient documentation

## 2020-05-05 DIAGNOSIS — Z8249 Family history of ischemic heart disease and other diseases of the circulatory system: Secondary | ICD-10-CM | POA: Diagnosis not present

## 2020-05-05 DIAGNOSIS — Y832 Surgical operation with anastomosis, bypass or graft as the cause of abnormal reaction of the patient, or of later complication, without mention of misadventure at the time of the procedure: Secondary | ICD-10-CM | POA: Diagnosis not present

## 2020-05-05 DIAGNOSIS — E1122 Type 2 diabetes mellitus with diabetic chronic kidney disease: Secondary | ICD-10-CM | POA: Diagnosis not present

## 2020-05-05 DIAGNOSIS — Z955 Presence of coronary angioplasty implant and graft: Secondary | ICD-10-CM | POA: Diagnosis not present

## 2020-05-05 DIAGNOSIS — I120 Hypertensive chronic kidney disease with stage 5 chronic kidney disease or end stage renal disease: Secondary | ICD-10-CM | POA: Diagnosis not present

## 2020-05-05 DIAGNOSIS — T82858A Stenosis of vascular prosthetic devices, implants and grafts, initial encounter: Secondary | ICD-10-CM | POA: Diagnosis present

## 2020-05-05 DIAGNOSIS — Z833 Family history of diabetes mellitus: Secondary | ICD-10-CM | POA: Diagnosis not present

## 2020-05-05 DIAGNOSIS — Z87891 Personal history of nicotine dependence: Secondary | ICD-10-CM | POA: Diagnosis not present

## 2020-05-05 DIAGNOSIS — Z992 Dependence on renal dialysis: Secondary | ICD-10-CM | POA: Diagnosis not present

## 2020-05-05 DIAGNOSIS — N185 Chronic kidney disease, stage 5: Secondary | ICD-10-CM | POA: Diagnosis not present

## 2020-05-05 HISTORY — PX: PERIPHERAL VASCULAR BALLOON ANGIOPLASTY: CATH118281

## 2020-05-05 LAB — POCT I-STAT, CHEM 8
BUN: 64 mg/dL — ABNORMAL HIGH (ref 6–20)
BUN: 65 mg/dL — ABNORMAL HIGH (ref 6–20)
Calcium, Ion: 1.08 mmol/L — ABNORMAL LOW (ref 1.15–1.40)
Calcium, Ion: 1.13 mmol/L — ABNORMAL LOW (ref 1.15–1.40)
Chloride: 102 mmol/L (ref 98–111)
Chloride: 103 mmol/L (ref 98–111)
Creatinine, Ser: 8.9 mg/dL — ABNORMAL HIGH (ref 0.44–1.00)
Creatinine, Ser: 9.3 mg/dL — ABNORMAL HIGH (ref 0.44–1.00)
Glucose, Bld: 176 mg/dL — ABNORMAL HIGH (ref 70–99)
Glucose, Bld: 192 mg/dL — ABNORMAL HIGH (ref 70–99)
HCT: 25 % — ABNORMAL LOW (ref 36.0–46.0)
HCT: 29 % — ABNORMAL LOW (ref 36.0–46.0)
Hemoglobin: 8.5 g/dL — ABNORMAL LOW (ref 12.0–15.0)
Hemoglobin: 9.9 g/dL — ABNORMAL LOW (ref 12.0–15.0)
Potassium: 6.7 mmol/L (ref 3.5–5.1)
Potassium: 6.7 mmol/L (ref 3.5–5.1)
Sodium: 135 mmol/L (ref 135–145)
Sodium: 135 mmol/L (ref 135–145)
TCO2: 20 mmol/L — ABNORMAL LOW (ref 22–32)
TCO2: 20 mmol/L — ABNORMAL LOW (ref 22–32)

## 2020-05-05 LAB — GLUCOSE, CAPILLARY
Glucose-Capillary: 164 mg/dL — ABNORMAL HIGH (ref 70–99)
Glucose-Capillary: 240 mg/dL — ABNORMAL HIGH (ref 70–99)

## 2020-05-05 SURGERY — PERIPHERAL VASCULAR BALLOON ANGIOPLASTY
Anesthesia: LOCAL

## 2020-05-05 MED ORDER — LIDOCAINE HCL (PF) 1 % IJ SOLN: 5.0000 mL | INTRAMUSCULAR | Status: DC | PRN

## 2020-05-05 MED ORDER — LIDOCAINE-PRILOCAINE 2.5-2.5 % EX CREA
1.0000 "application " | TOPICAL_CREAM | CUTANEOUS | Status: DC | PRN
  Filled 2020-05-05: qty 5

## 2020-05-05 MED ORDER — SODIUM CHLORIDE 0.9 % IV SOLN: 100.0000 mL | INTRAVENOUS | Status: DC | PRN

## 2020-05-05 MED ORDER — LIDOCAINE HCL (PF) 1 % IJ SOLN
INTRAMUSCULAR | Status: AC
  Filled 2020-05-05: qty 30

## 2020-05-05 MED ORDER — HEPARIN SODIUM (PORCINE) 1000 UNIT/ML DIALYSIS
1000.0000 [IU] | INTRAMUSCULAR | Status: DC | PRN
  Filled 2020-05-05: qty 1

## 2020-05-05 MED ORDER — DIPHENHYDRAMINE HCL 50 MG/ML IJ SOLN: 25.0000 mg | Freq: Once | INTRAMUSCULAR | Status: AC

## 2020-05-05 MED ORDER — HEPARIN SODIUM (PORCINE) 1000 UNIT/ML IJ SOLN
INTRAMUSCULAR | Status: AC
  Filled 2020-05-05: qty 1

## 2020-05-05 MED ORDER — IODIXANOL 320 MG/ML IV SOLN
INTRAVENOUS | Status: DC | PRN
  Administered 2020-05-05: 35 mL via INTRAVENOUS

## 2020-05-05 MED ORDER — PENTAFLUOROPROP-TETRAFLUOROETH EX AERO
1.0000 "application " | INHALATION_SPRAY | CUTANEOUS | Status: DC | PRN
  Filled 2020-05-05: qty 30

## 2020-05-05 MED ORDER — METHYLPREDNISOLONE SODIUM SUCC 125 MG IJ SOLR: 125.0000 mg | Freq: Once | INTRAMUSCULAR | Status: AC

## 2020-05-05 MED ORDER — DIPHENHYDRAMINE HCL 50 MG/ML IJ SOLN
INTRAMUSCULAR | Status: AC
  Administered 2020-05-05: 25 mg via INTRAVENOUS
  Filled 2020-05-05: qty 1

## 2020-05-05 MED ORDER — METHYLPREDNISOLONE SODIUM SUCC 125 MG IJ SOLR
INTRAMUSCULAR | Status: AC
  Administered 2020-05-05: 125 mg via INTRAVENOUS
  Filled 2020-05-05: qty 2

## 2020-05-05 MED ORDER — HEPARIN SODIUM (PORCINE) 1000 UNIT/ML IJ SOLN
INTRAMUSCULAR | Status: DC | PRN
  Administered 2020-05-05: 3000 [IU] via INTRAVENOUS

## 2020-05-05 MED ORDER — HEPARIN (PORCINE) IN NACL 1000-0.9 UT/500ML-% IV SOLN
INTRAVENOUS | Status: AC
  Filled 2020-05-05: qty 500

## 2020-05-05 MED ORDER — ALTEPLASE 2 MG IJ SOLR
2.0000 mg | Freq: Once | INTRAMUSCULAR | Status: DC | PRN
  Filled 2020-05-05: qty 2

## 2020-05-05 MED ORDER — CHLORHEXIDINE GLUCONATE CLOTH 2 % EX PADS: 6.0000 | MEDICATED_PAD | Freq: Every day | CUTANEOUS | Status: DC

## 2020-05-05 MED ORDER — LIDOCAINE HCL (PF) 1 % IJ SOLN
INTRAMUSCULAR | Status: DC | PRN
  Administered 2020-05-05: 2 mL via INTRADERMAL

## 2020-05-05 SURGICAL SUPPLY — 16 items
BAG SNAP BAND KOVER 36X36 (MISCELLANEOUS) ×3 IMPLANT
BALLN MUSTANG 8X20X75 (BALLOONS) ×3
BALLN MUSTANG 8X60X75 (BALLOONS) ×3
BALLOON MUSTANG 8X20X75 (BALLOONS) ×2 IMPLANT
BALLOON MUSTANG 8X60X75 (BALLOONS) ×2 IMPLANT
COVER DOME SNAP 22 D (MISCELLANEOUS) ×3 IMPLANT
KIT ENCORE 26 ADVANTAGE (KITS) ×3 IMPLANT
KIT MICROPUNCTURE NIT STIFF (SHEATH) ×3 IMPLANT
PROTECTION STATION PRESSURIZED (MISCELLANEOUS) ×3
SHEATH PINNACLE R/O II 6F 4CM (SHEATH) ×3 IMPLANT
SHEATH PROBE COVER 6X72 (BAG) ×3 IMPLANT
STATION PROTECTION PRESSURIZED (MISCELLANEOUS) ×2 IMPLANT
STOPCOCK MORSE 400PSI 3WAY (MISCELLANEOUS) ×3 IMPLANT
TRAY PV CATH (CUSTOM PROCEDURE TRAY) ×3 IMPLANT
TUBING CIL FLEX 10 FLL-RA (TUBING) ×3 IMPLANT
WIRE BENTSON .035X145CM (WIRE) ×3 IMPLANT

## 2020-05-05 NOTE — Progress Notes (Signed)
Client returned from hemodialysis; called transportation to take client to residence

## 2020-05-05 NOTE — Progress Notes (Signed)
Transferred to hemodialysis unit via stretcher with cardiac monitor

## 2020-05-05 NOTE — H&P (Addendum)
History and Physical Interval Note:  05/05/2020 7:21 AM  [name]  has presented today for surgery, with the diagnosis of instage renal.  The various methods of treatment have been discussed with the patient and family. After consideration of risks, benefits and other options for treatment, the patient has consented to  Procedure(s): A/V FISTULAGRAM - Left (N/A) as a surgical intervention.  The patient's history has been reviewed, patient examined, no change in status, stable for surgery.  I have reviewed the patient's chart and labs.  Questions were answered to the patient's satisfaction.    One more attempt at left arm fistulogram.    Melanie Hall  Reason for Consult:  Malfunction left arm AVF MRN #:  [id number]  History of Present Illness: This is a 49 y.o. female with ESRD who presents with malfunction left arm AVF.  She had a left brachiocephalic AV fistula placed on 08/17/2019. When she 1st began using this she had infiltration. Of note she dialyzes on Monday Wednesdays and Fridays and has a functioning dialysis catheter.  Previously underwent fistulogram with Dr. Trula Slade on 01/12/20.  Left cephalic vein was angioplastied with 7 mm Ranger.  She states still not working.  Good thrill.       Past Medical History:  Diagnosis Date  . Acute cystitis without hematuria   . Acute kidney failure ([place]) 02/28/2019  . Acute midline thoracic back pain 12/29/2019  . Acute on chronic renal failure ([place]) 02/28/2019  . Acute systolic heart failure ([place]) 10/21/2019  . Anemia   . Cataracts, bilateral    surgery to remove  . CATARACTS, BILATERAL 07/02/2007   Qualifier: Diagnosis of  By: Isla Pence    . Closed fracture of left femur ([place]) 10/28/2018  . Closed fracture of right ankle 11/06/2017  . Diabetes mellitus    type 2  . Emphysematous cystitis 08/26/2018  . Encounter for gynecological examination with Papanicolaou smear of cervix 01/14/2020  . Encounter for screening fecal  occult blood testing 01/14/2020  . Encounter for screening for malignant neoplasm of cervix 12/29/2019  . Encounter for screening mammogram for malignant neoplasm of breast 12/29/2019  . ESRD on hemodialysis ([place])   . GERD (gastroesophageal reflux disease)   . Hyperlipidemia   . Hypertension   . Hypokalemia 08/26/2018  . IRREGULAR MENSES 09/14/2009   Qualifier: Diagnosis of  By: Hassell Done FNP, Tori Milks    . Loose stools 11/12/2019  . Normocytic anemia 08/26/2018  . PARONYCHIA, RIGHT GREAT TOE 07/30/2008   Qualifier: Diagnosis of  By: Hassell Done FNP, Tori Milks    . Pressure ulcer 09/10/2019  . Right arm weakness 08/08/2019  . Sprain of left ankle   . STEMI (ST elevation myocardial infarction) ([place]) 10/18/2019  . STEMI involving right coronary artery ([place]) 10/18/2019  . Stroke ([place])   . SVD (spontaneous vaginal delivery)    x 4  . Vaginosis 08/26/2018  . Weakness 09/10/2019  . Weakness of both lower extremities          Past Surgical History:  Procedure Laterality Date  . A/V FISTULAGRAM Left 01/12/2020   Procedure: A/V FISTULAGRAM;  Surgeon: Serafina Mitchell, MD;  Location: [facility];  Service: Cardiovascular;  Laterality: Left;  . AV FISTULA PLACEMENT Left 08/17/2019   Procedure: LEFT BRACHIAL CEPHALIC ARTERIOVENOUS (AV) FISTULA;  Surgeon: Angelia Mould, MD;  Location: [facility];  Service: Vascular;  Laterality: Left;  . CORONARY STENT INTERVENTION N/A 10/18/2019   Procedure: CORONARY STENT INTERVENTION;  Surgeon: Burt Knack,  Legrand Como, MD;  Location: [facility];  Service: Cardiovascular;  Laterality: N/A;  . CORONARY/GRAFT ACUTE MI REVASCULARIZATION N/A 10/18/2019   Procedure: Coronary/Graft Acute MI Revascularization;  Surgeon: Sherren Mocha, MD;  Location: [facility];  Service: Cardiovascular;  Laterality: N/A;  . EYE SURGERY Bilateral    cataracts removed  . FEMUR IM NAIL Left 10/28/2018   Procedure: RETROGRADE FEMORAL NAILING;  Surgeon: Meredith Pel,  MD;  Location: [facility];  Service: Orthopedics;  Laterality: Left;  . IM NAILING FEMORAL SHAFT RETROGRADE Left 10/28/2018  . INTRAVASCULAR ULTRASOUND/IVUS N/A 10/18/2019   Procedure: Intravascular Ultrasound/IVUS;  Surgeon: Sherren Mocha, MD;  Location: [facility];  Service: Cardiovascular;  Laterality: N/A;  . IR FLUORO GUIDE CV LINE RIGHT  08/11/2019  . IR THORACENTESIS ASP PLEURAL SPACE W/IMG GUIDE  11/12/2019  . IR US GUIDE VASC ACCESS RIGHT  08/11/2019  . KNEE SURGERY Left   . LEFT HEART CATH AND CORONARY ANGIOGRAPHY N/A 10/18/2019   Procedure: LEFT HEART CATH AND CORONARY ANGIOGRAPHY;  Surgeon: Sherren Mocha, MD;  Location: [facility];  Service: Cardiovascular;  Laterality: N/A;  . PERIPHERAL VASCULAR BALLOON ANGIOPLASTY Left 01/12/2020   Procedure: PERIPHERAL VASCULAR BALLOON ANGIOPLASTY;  Surgeon: Serafina Mitchell, MD;  Location: [facility];  Service: Cardiovascular;  Laterality: Left;  AVF  . RADIOLOGY WITH ANESTHESIA N/A 09/15/2019   Procedure: [facility] AND LUMBER LOWER BACK PAIN;  Surgeon: Radiologist, Medication, MD;  Location: [facility];  Service: Radiology;  Laterality: N/A;  . TUBAL LIGATION          Allergies  Allergen Reactions  . Penicillins            Prior to Admission medications   Medication Sig Start Date End Date Taking? Authorizing Provider  aspirin 81 MG EC tablet Take 1 tablet (81 mg total) by mouth daily. 01/13/20  Yes Perlie Mayo, NP  atorvastatin (LIPITOR) 80 MG tablet Take 1 tablet (80 mg total) by mouth daily at 6 PM. 11/24/19  Yes Weaver, Nicki Reaper T, PA-C  carvedilol (COREG) 6.25 MG tablet Take 1.5 tablets (9.375 mg total) by mouth 2 (two) times daily. 01/13/20  Yes Perlie Mayo, NP  epoetin alfa (EPOGEN) 3000 UNIT/ML injection Inject 3,000 Units into the skin every 14 (fourteen) days.  06/12/19  Yes [provider]  gabapentin (NEURONTIN) 300 MG capsule Take 1 capsule (300 mg total) by mouth at bedtime. 02/18/20  Yes  Perlie Mayo, NP  hydrALAZINE (APRESOLINE) 10 MG tablet Take 1 tablet (10 mg total) by mouth in the morning and at bedtime. 11/24/19  Yes Weaver, Scott T, PA-C  insulin aspart protamine- aspart (NOVOLOG MIX 70/30) (70-30) 100 UNIT/ML injection Inject 0.1 mLs (10 Units total) into the skin 2 (two) times daily with a meal. 02/18/20  Yes Perlie Mayo, NP  isosorbide mononitrate (IMDUR) 30 MG 24 hr tablet Take 0.5 tablets (15 mg total) by mouth daily. 01/05/20 01/04/21 Yes Weaver, Scott T, PA-C  levothyroxine (SYNTHROID) 25 MCG tablet Take 1 tablet (25 mcg total) by mouth daily before breakfast. 02/18/20  Yes Perlie Mayo, NP  multivitamin (RENA-VIT) TABS tablet Take 1 tablet by mouth at bedtime. 08/25/19  Yes Hoyt Koch, MD  oxyCODONE-acetaminophen (PERCOCET/ROXICET) 5-325 MG tablet Take 1 tablet by mouth every 6 (six) hours as needed. 02/26/20  Yes Milton Ferguson, MD  sertraline (ZOLOFT) 25 MG tablet Take 1 tablet (25 mg total) by mouth daily. 02/18/20 03/19/20 Yes Jerelene Redden,  Barbee Cough, NP  tamsulosin (FLOMAX) 0.4 MG CAPS capsule Take 1 capsule (0.4 mg total) by mouth daily after supper. 02/18/20  Yes Perlie Mayo, NP  ticagrelor (BRILINTA) 90 MG TABS tablet Take 1 tablet (90 mg total) by mouth 2 (two) times daily. 11/24/19  Yes Weaver, Scott T, PA-C  nitroGLYCERIN (NITROSTAT) 0.4 MG SL tablet Place 1 tablet (0.4 mg total) under the tongue every 5 (five) minutes x 3 doses as needed for chest pain. 11/24/19   Richardson Dopp T, PA-C  ondansetron (ZOFRAN ODT) 4 MG disintegrating tablet 4mg  ODT q4 hours prn nausea/vomit 02/26/20   Milton Ferguson, MD  zolpidem (AMBIEN) 5 MG tablet Take 1 tablet (5 mg total) by mouth at bedtime as needed for sleep. 10/29/19   Soyla Dryer, PA-C    Social History        Socioeconomic History  . Marital status: Widowed    Spouse name: Not on file  . Number of children: 4  . Years of education: Not on file  . Highest education level: 10th grade   Occupational History  . Not on file  Tobacco Use  . Smoking status: Former Smoker    Packs/day: 0.25    Years: 2.00    Pack years: 0.50    Types: Cigarettes    Quit date: 1997    Years since quitting: 24.7  . Smokeless tobacco: Never Used  Vaping Use  . Vaping Use: Never used  Substance and Sexual Activity  . Alcohol use: No  . Drug use: No  . Sexual activity: Yes    Birth control/protection: Surgical    Comment: tubal   Other Topics Concern  . Not on file  Social History Narrative   Lives with friend with Joelene Millin   Cats: [place], [place], [place], another kitten      Enjoy: watching TV-[employer], [employer], and [employer]      Diet: eats all food groups: focuses on Dm and Kidney friendly foods    Caffeine: none   Water: Fluid Restriction 32 ounces a day          Wears seat belt: not currently driving    Oceanographer at home    No weapons    Social Determinants of Health      Financial Resource Strain: Low Risk   . Difficulty of Paying Living Expenses: Not hard at all  Food Insecurity: No Food Insecurity  . Worried About Charity fundraiser in the Last Year: Never true  . Ran Out of Food in the Last Year: Never true  Transportation Needs: No Transportation Needs  . Lack of Transportation (Medical): No  . Lack of Transportation (Non-Medical): No  Physical Activity: Inactive  . Days of Exercise per Week: 0 days  . Minutes of Exercise per Session: 0 min  Stress: No Stress Concern Present  . Feeling of Stress : Not at all  Social Connections: Socially Isolated  . Frequency of Communication with Friends and Family: Never  . Frequency of Social Gatherings with Friends and Family: Never  . Attends Religious Services: Never  . Active Member of Clubs or Organizations: No  . Attends Archivist Meetings: Never  . Marital Status: Widowed  Intimate Partner Violence: Not At Risk  . Fear of Current or Ex-Partner: No  . Emotionally  Abused: No  . Physically Abused: No  . Sexually Abused: No          Family History  Problem Relation Age of Onset  .  Diabetes Mother   . Hypertension Mother   . Bipolar disorder Mother   . Brain cancer Maternal Aunt   . Heart disease Maternal Grandmother   . Diabetes Maternal Grandmother   . Breast cancer Maternal Aunt   . Depression Father        Committed suicide  . Diabetes Sister     ROS: [x]  Positive   [ ]  Negative   [ ]  All sytems reviewed and are negative  Cardiovascular: []  chest pain/pressure []  palpitations []  SOB lying flat []  DOE []  pain in legs while walking []  pain in legs at rest []  pain in legs at night []  non-healing ulcers []  hx of DVT []  swelling in legs  Pulmonary: []  productive cough []  asthma/wheezing []  home O2  Neurologic: []  weakness in []  arms []  legs []  numbness in []  arms []  legs []  hx of CVA []  mini stroke [] difficulty speaking or slurred speech []  temporary loss of vision in one eye []  dizziness  Hematologic: []  hx of cancer []  bleeding problems []  problems with blood clotting easily  Endocrine:   []  diabetes []  thyroid disease  GI []  vomiting blood []  blood in stool  GU: []  CKD/renal failure []  HD--[]  M/W/F or []  T/T/S []  burning with urination []  blood in urine  Psychiatric: []  anxiety []  depression  Musculoskeletal: []  arthritis []  joint pain  Integumentary: []  rashes []  ulcers  Constitutional: []  fever []  chills   Physical Examination     Vitals:   03/17/20 0551  BP: (!) 153/75  Pulse: 87  Resp: 15  Temp: 98.5 F (36.9 C)  SpO2: 97%   Body mass index is 23.03 kg/m.  General:  WDWN in NAD Gait: Not observed HENT: WNL, normocephalic Pulmonary: normal non-labored breathing, without Rales, rhonchi,  wheezing Cardiac: regular, without  Murmurs, rubs or gallops Vascular Exam/Pulses: Left brachiocephalic AVF excellent thrill Left radial pulse  palpable Extremities: without ischemic changes, without Gangrene , without cellulitis; without open wounds;  Musculoskeletal: no muscle wasting or atrophy       Neurologic: A&O X 3; Appropriate Affect ; SENSATION: normal; MOTOR FUNCTION:  moving all extremities equally. Speech is fluent/normal   CBC Labs (Brief)          Component Value Date/Time   WBC 8.1 11/12/2019 2309   RBC 3.43 (L) 11/12/2019 2309   HGB 12.2 03/17/2020 0602   HCT 36.0 03/17/2020 0602   HCT 32.1 (L) 09/10/2019 1820   PLT 186 11/12/2019 2309   MCV 103.2 (H) 11/12/2019 2309   MCH 32.1 11/12/2019 2309   MCHC 31.1 11/12/2019 2309   RDW 15.9 (H) 11/12/2019 2309   LYMPHSABS 1.4 11/12/2019 2309   MONOABS 0.7 11/12/2019 2309   EOSABS 0.4 11/12/2019 2309   BASOSABS 0.1 11/12/2019 2309      BMET Labs (Brief)          Component Value Date/Time   NA 136 03/17/2020 0602   K 3.3 (L) 03/17/2020 0602   CL 96 (L) 03/17/2020 0602   CO2 22 11/12/2019 2309   GLUCOSE 240 (H) 03/17/2020 0602   BUN 17 03/17/2020 0602   CREATININE 3.10 (H) 03/17/2020 0602   CALCIUM 8.9 11/12/2019 2309   GFRNONAA 11 (L) 11/12/2019 2309   GFRAA 13 (L) 11/12/2019 2309      COAGS: Recent Labs       Lab Results  Component Value Date   INR 1.2 11/12/2019   INR 1.1 10/18/2019   INR 1.2 08/11/2019  Non-Invasive Vascular Imaging:    None  ASSESSMENT/PLAN: This is a 49 y.o. female with end-stage renal disease that presents with malfunctioning left brachiocephalic AV fistula that has also been slow to mature.  Previously had left cephalic vein angioplasty in July 2021 by Dr. Trula Slade.  Has an excellent thrill on exam.  She states this is still not working.  We will plan fistulogram with possible intervention.  Risk and benefits discussed.  Melanie Heck, MD [facility] Office: [phone]

## 2020-05-05 NOTE — Progress Notes (Signed)
Potassium 6.7 per I stat [name], Rn called and informed.

## 2020-05-05 NOTE — Op Note (Signed)
OPERATIVE NOTE   PROCEDURE: 1. left brachiocephalic arteriovenous fistula cannulation under ultrasound guidance 2. left arm fistulogram including central venogram 3. left peripheral venoplasty of left upper arm cephalic vein (8 x 60 mm Mustang and 8 mm x 20 mm Mustang)   PRE-OPERATIVE DIAGNOSIS: Malfunctioning left arteriovenous fistula  POST-OPERATIVE DIAGNOSIS: same as above   SURGEON: Marty Heck, MD  ANESTHESIA: local  ESTIMATED BLOOD LOSS: 5 cc  FINDING(S): 1. No evidence of central venous stenosis.  Upper arm cephalic vein near cephalic arch had recurrent stenosis with a focal band greater than 75% stenosis.  This was initially angioplastied with an 8 mm x 60 mm Mustang.  This did not completely treat the focal band stenosis.  I then used a shorter balloon with more radial force and a 8 mm x 20 mm Mustang was used with complete resolution of the stenosis.  She has an excellent thrill and brisk flow in the fistula.  SPECIMEN(S):  None  CONTRAST: 35 cc  INDICATIONS: [facility] is a 49 y.o. female who  presents with malfunctioning left arteriovenous fistula.  The patient is scheduled for left arm fistulogram.  The patient is aware the risks include but are not limited to: bleeding, infection, thrombosis of the cannulated access, and possible anaphylactic reaction to the contrast.  The patient is aware of the risks of the procedure and elects to proceed forward.  DESCRIPTION: After full informed written consent was obtained, the patient was brought back to the angiography suite and placed supine upon the angiography table.  The patient was connected to monitoring equipment.  The left arm was prepped and draped in the standard fashion for a left arm fistulogram.  Under ultrasound guidance, the left arteriovenous fistula was evaluated, it was patent, an image was saved.  It was cannulated with a micropuncture needle.  The microwire was advanced into the fistula and the  needle was exchanged for the a microsheath, which was lodged 2 cm into the access.  The wire was removed and the sheath was connected to the IV extension tubing.  Hand injections were completed to image the access from the antecubitum up to the level of axilla.  The central venous structures were also imaged by hand injections.  After evaluating images we elected to intervene on a recurrent upper arm cephalic vein stenosis.  Patient was given 3000 units of IV heparin.  I then used a Bentson wire exchanged for a short 6 [place] sheath.  We selected an 8 mm x 60 mm Mustang to treat the upper arm cephalic vein into the cephalic arch to nominal pressure for 2 minutes.  There was a clear focal band of stenosis that was clearly not treated even with over expansion of the balloon.  We then selected a shorter 8 mm x 20 mm Mustang for more radial force and treated the focal band in the upper arm cephalic vein with full expansion.  She had significant pain with this.  Another image showed no extravasation and no residual stenosis.  Excellent thrill in the fistula and flow through the upper arm cephalic vein and no central stenosis.  I elected not to use a drug balloon given she has had multiple drug balloons recently that have been not very durable after treatment.  A 4-0 Monocryl purse-string suture was sewn around the sheath.  The sheath was removed while tying down the suture.  A sterile bandage was applied to the puncture site.  COMPLICATIONS: None  CONDITION: Stable  Marty Heck, MD [facility] Central Office: [place]   05/05/2020 8:17 AM

## 2020-05-05 NOTE — Discharge Instructions (Signed)

## 2020-05-05 NOTE — Progress Notes (Signed)
49 year old female that underwent left arm fistulogram with intervention today.  Her potassium is 6.7.  I discussed with Dr. Augustin Coupe about getting her dialyzed here since she has no transportation to her dialysis center at [facility] in [place].  Appreciate nephrology input.  Hopefully can be discharged after dialysis.  Does have a working catheter as well.  Marty Heck, MD [facility] [place] Office: [phone]

## 2020-05-05 NOTE — Progress Notes (Addendum)
K+ 6.7 [facility] notified. Will repeat  Also notified that pt has had vomiting in the past with contrast and it is listed as an allergy. Awaiting pre treatment orders

## 2020-05-06 ENCOUNTER — Encounter (HOSPITAL_COMMUNITY): Payer: Self-pay | Admitting: Vascular Surgery

## 2020-05-06 MED FILL — Heparin Sod (Porcine)-NaCl IV Soln 1000 Unit/500ML-0.9%: INTRAVENOUS | Qty: 500 | Status: AC

## 2020-05-07 ENCOUNTER — Emergency Department (HOSPITAL_COMMUNITY): Payer: Medicaid Other

## 2020-05-07 ENCOUNTER — Emergency Department (HOSPITAL_COMMUNITY)
Admission: EM | Admit: 2020-05-07 | Discharge: 2020-05-07 | Disposition: A | Discharge status: Home / Self Care | Payer: Medicaid Other | Attending: Emergency Medicine | Admitting: Emergency Medicine

## 2020-05-07 ENCOUNTER — Encounter (HOSPITAL_COMMUNITY): Payer: Self-pay | Admitting: Emergency Medicine

## 2020-05-07 DIAGNOSIS — I132 Hypertensive heart and chronic kidney disease with heart failure and with stage 5 chronic kidney disease, or end stage renal disease: Secondary | ICD-10-CM | POA: Insufficient documentation

## 2020-05-07 DIAGNOSIS — R52 Pain, unspecified: Secondary | ICD-10-CM | POA: Diagnosis not present

## 2020-05-07 DIAGNOSIS — Z955 Presence of coronary angioplasty implant and graft: Secondary | ICD-10-CM | POA: Diagnosis not present

## 2020-05-07 DIAGNOSIS — I10 Essential (primary) hypertension: Secondary | ICD-10-CM | POA: Diagnosis not present

## 2020-05-07 DIAGNOSIS — N186 End stage renal disease: Secondary | ICD-10-CM | POA: Insufficient documentation

## 2020-05-07 DIAGNOSIS — R0902 Hypoxemia: Secondary | ICD-10-CM | POA: Diagnosis not present

## 2020-05-07 DIAGNOSIS — Z794 Long term (current) use of insulin: Secondary | ICD-10-CM | POA: Diagnosis not present

## 2020-05-07 DIAGNOSIS — T07XXXA Unspecified multiple injuries, initial encounter: Secondary | ICD-10-CM | POA: Diagnosis not present

## 2020-05-07 DIAGNOSIS — W19XXXA Unspecified fall, initial encounter: Secondary | ICD-10-CM | POA: Diagnosis not present

## 2020-05-07 DIAGNOSIS — M7989 Other specified soft tissue disorders: Secondary | ICD-10-CM | POA: Diagnosis not present

## 2020-05-07 DIAGNOSIS — I5021 Acute systolic (congestive) heart failure: Secondary | ICD-10-CM | POA: Insufficient documentation

## 2020-05-07 DIAGNOSIS — Z87891 Personal history of nicotine dependence: Secondary | ICD-10-CM | POA: Diagnosis not present

## 2020-05-07 DIAGNOSIS — Z992 Dependence on renal dialysis: Secondary | ICD-10-CM | POA: Insufficient documentation

## 2020-05-07 DIAGNOSIS — E1122 Type 2 diabetes mellitus with diabetic chronic kidney disease: Secondary | ICD-10-CM | POA: Diagnosis not present

## 2020-05-07 DIAGNOSIS — Z79899 Other long term (current) drug therapy: Secondary | ICD-10-CM | POA: Diagnosis not present

## 2020-05-07 DIAGNOSIS — Z7982 Long term (current) use of aspirin: Secondary | ICD-10-CM | POA: Diagnosis not present

## 2020-05-07 DIAGNOSIS — E039 Hypothyroidism, unspecified: Secondary | ICD-10-CM | POA: Diagnosis not present

## 2020-05-07 DIAGNOSIS — S82892A Other fracture of left lower leg, initial encounter for closed fracture: Secondary | ICD-10-CM

## 2020-05-07 DIAGNOSIS — R609 Edema, unspecified: Secondary | ICD-10-CM | POA: Diagnosis not present

## 2020-05-07 DIAGNOSIS — S99912A Unspecified injury of left ankle, initial encounter: Secondary | ICD-10-CM | POA: Diagnosis present

## 2020-05-07 DIAGNOSIS — S8292XA Unspecified fracture of left lower leg, initial encounter for closed fracture: Secondary | ICD-10-CM | POA: Insufficient documentation

## 2020-05-07 IMAGING — DX DG ANKLE COMPLETE 3+V*L*
3 series · 3 of 3 positions shown · non-contrast
Comparison: None.

CLINICAL DATA: Fall, twisting ankle.

EXAM:
LEFT ANKLE COMPLETE - 3+ VIEW

[ankle ap]
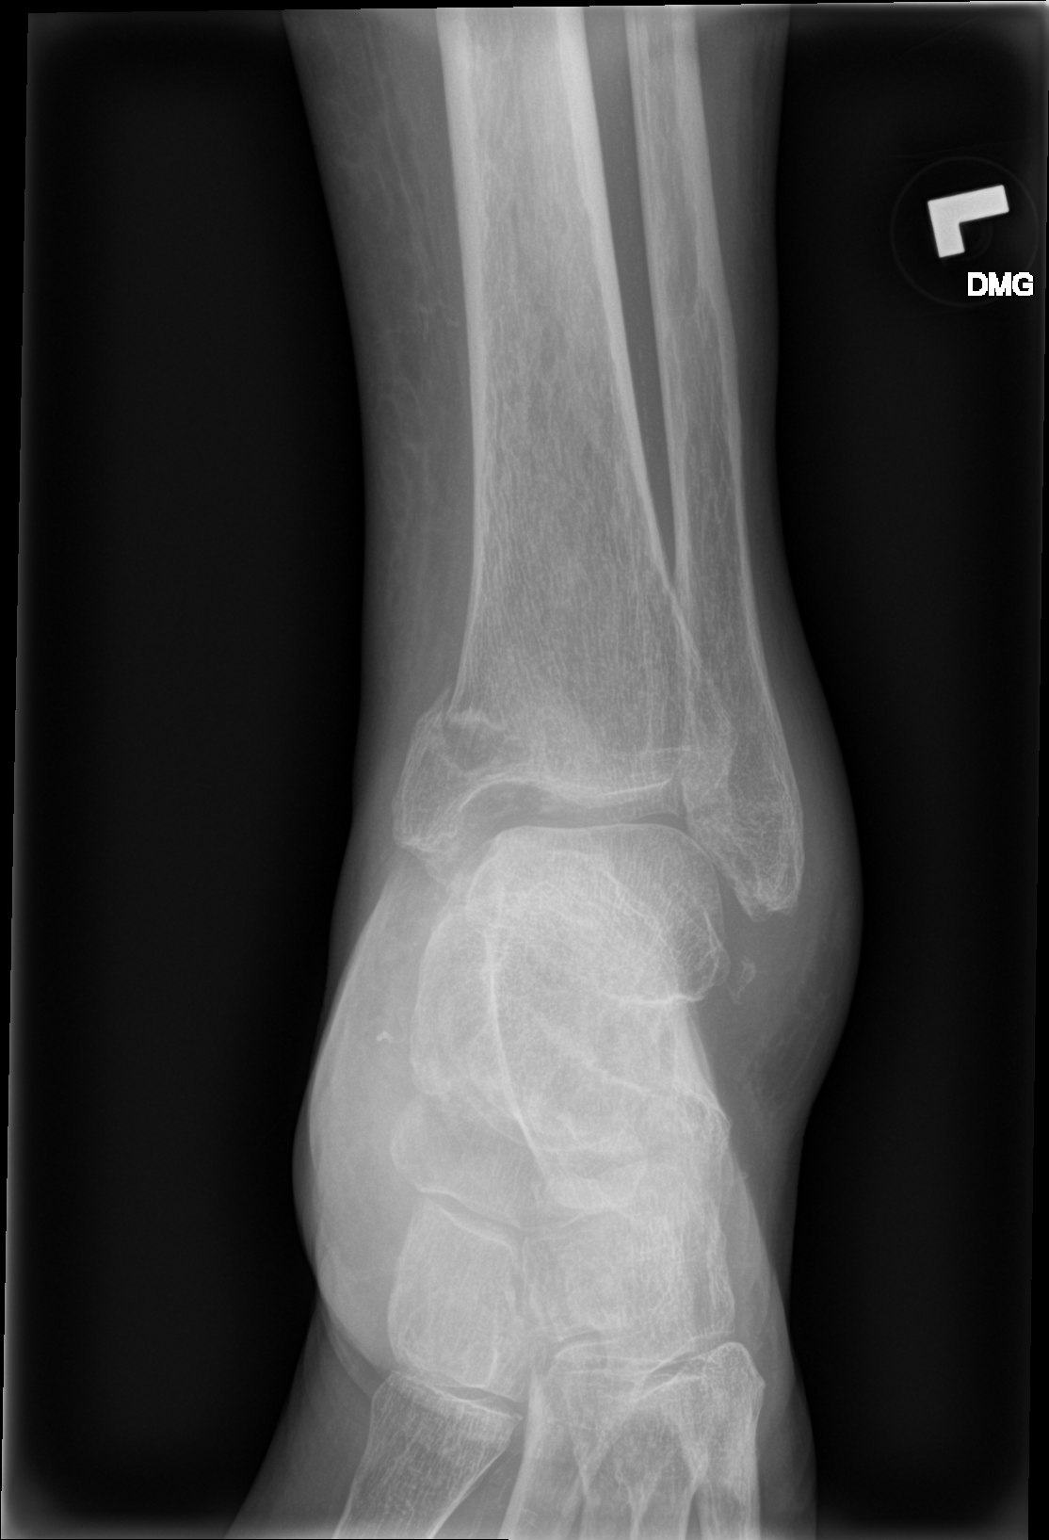

[ankle obl]
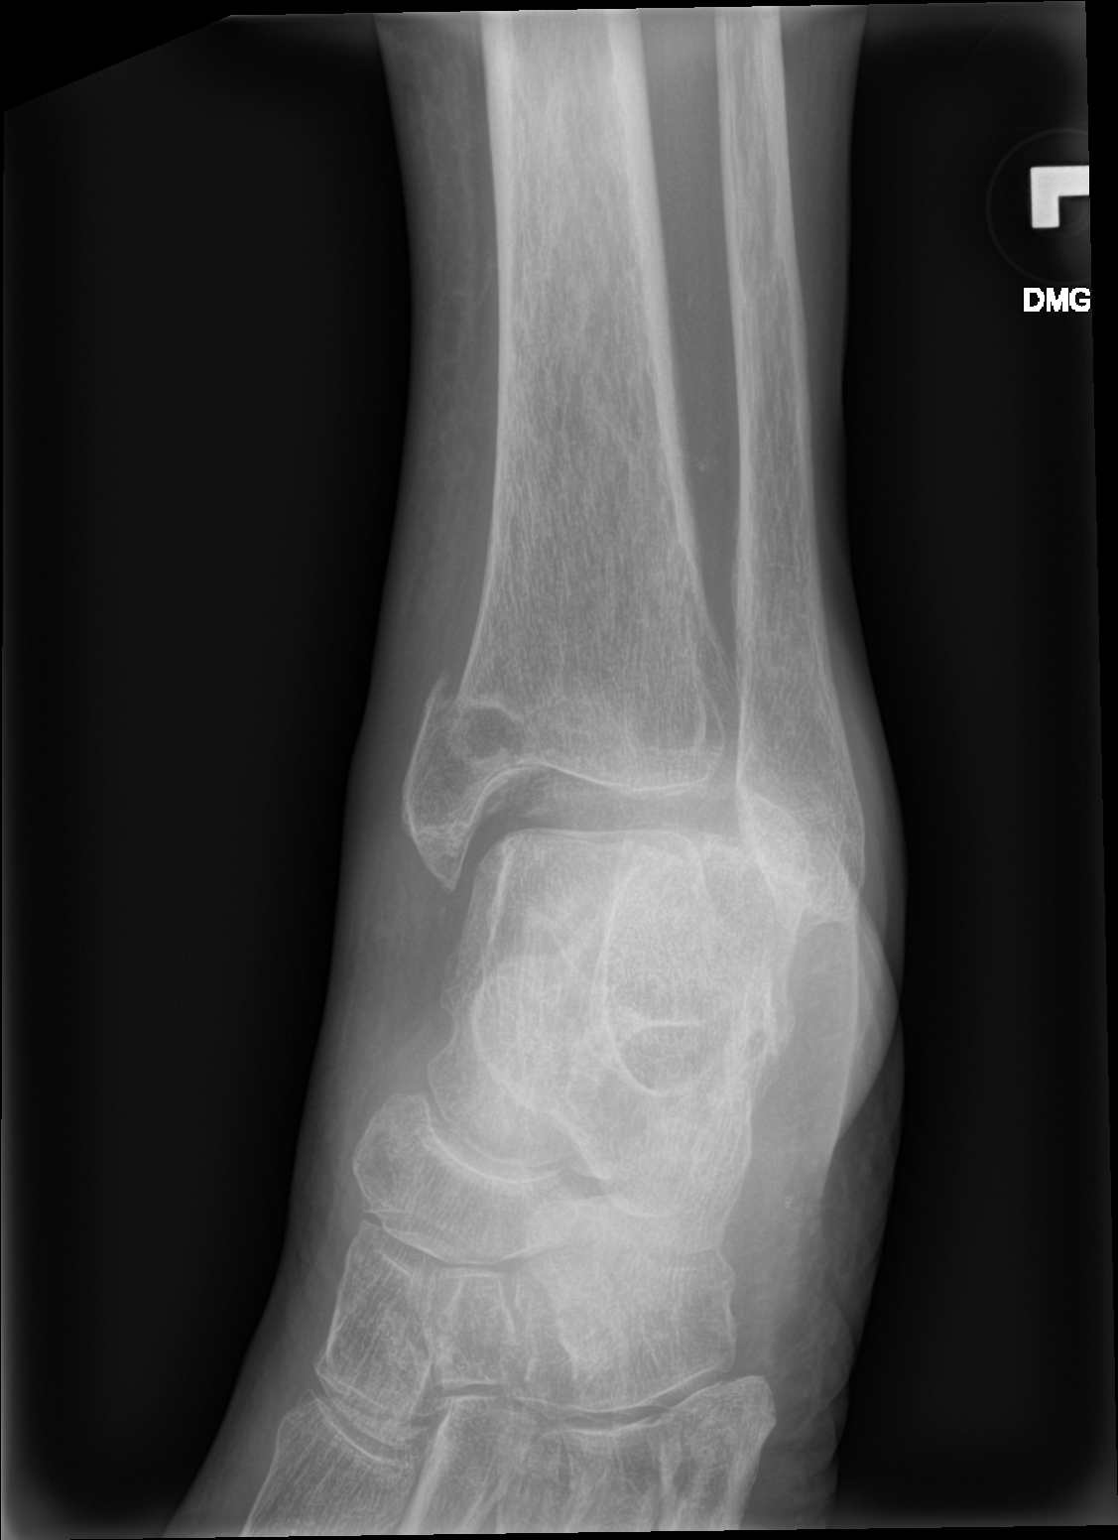

[ankle lat]
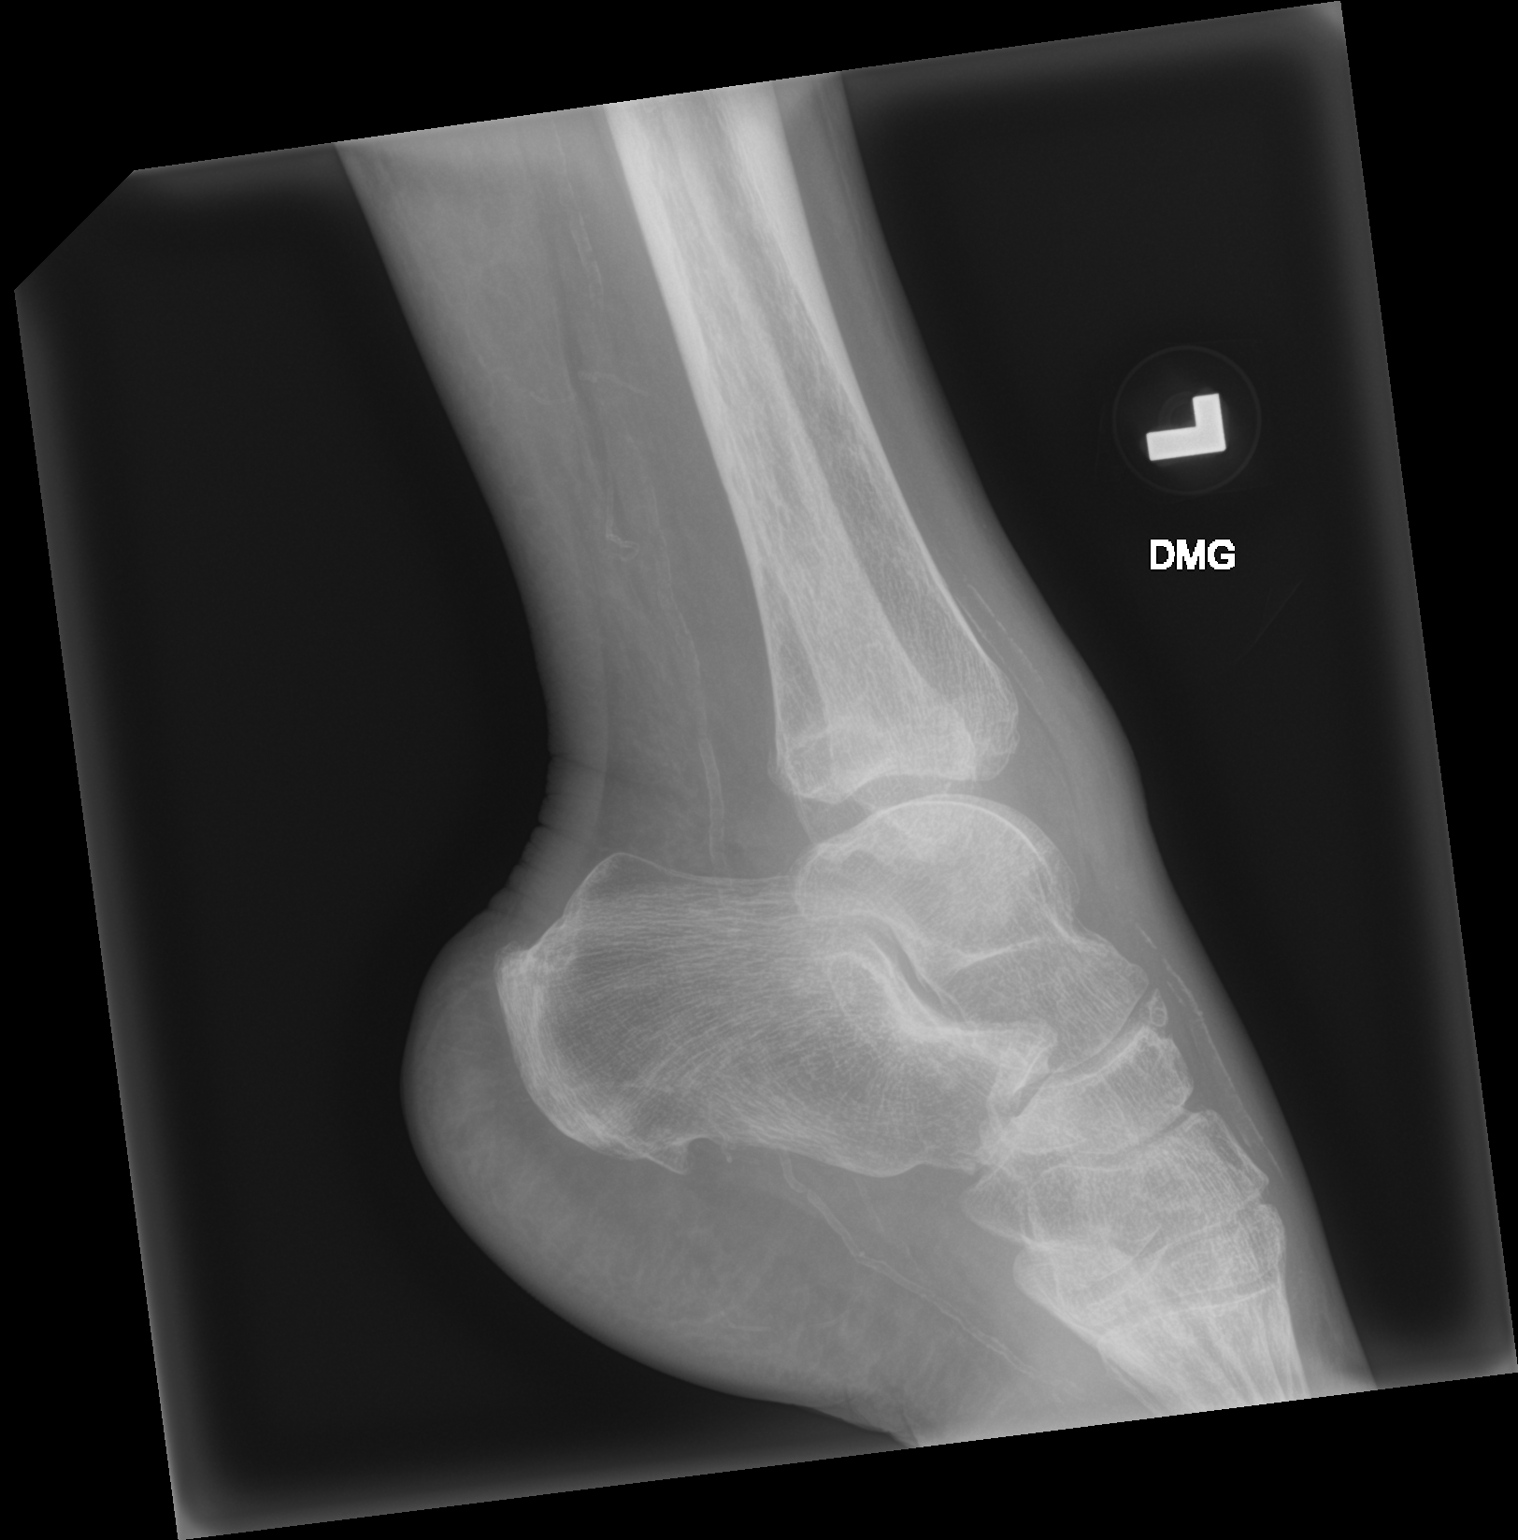

[3 of 3 positions shown; findings below may reference images not displayed]

FINDINGS: Fracture noted the base of the medial malleolus. Avulsion fracture
off the lateral talus. There appears to be irregularity of the
lateral talar dome. Widening of the ankle mortise. Diffuse soft
tissue swelling.
IMPRESSION: Fracture the base of the medial malleolus. Concern for fracture
through the lateral talar dome. Avulsion fracture off the lateral
talus.

## 2020-05-07 MED ORDER — HYDROCODONE-ACETAMINOPHEN 5-325 MG PO TABS
1.0000 | ORAL_TABLET | Freq: Once | ORAL | Status: AC
  Administered 2020-05-07: 1 via ORAL
  Filled 2020-05-07: qty 1

## 2020-05-07 MED ORDER — HYDROCODONE-ACETAMINOPHEN 5-325 MG PO TABS: ORAL_TABLET | ORAL | 0 refills | Status: DC

## 2020-05-07 NOTE — ED Notes (Signed)
Posterior /sturrup splint applied  Pt request public transportation to take her home   She ambulates with a walker   Has none here and cannot get into her home unassisted   Pt requires a great deal of assistance as she could not go to the toilet and pivot on her good leg without N help

## 2020-05-07 NOTE — Discharge Instructions (Addendum)
Your x-rays this evening shows you have a broken bone in your ankle.  Try to elevate your left leg when possible.  Minimal weightbearing and use your walker.  Call Dr. Ruthe Mannan office on Monday to arrange a follow-up appointment.

## 2020-05-07 NOTE — ED Triage Notes (Signed)
Pt states she fell yesterday twisting her left ankle; pt has swelling to outer ankle and states she is unable to put any weight on the foot

## 2020-05-07 NOTE — ED Provider Notes (Signed)
[facility] EMERGENCY DEPARTMENT Provider Note   CSN: [id number] Arrival date & time: 05/07/20  2020     History Chief Complaint  Patient presents with  . Ankle Pain    Melanie Hall is a 49 y.o. female.  HPI     Melanie Hall is a 49 y.o. female with past medical history of type 2 diabetes, systolic heart failure, hypertension, and prior stroke.  she presents to the Emergency Department complaining of pain and swelling of her left ankle.  She reports to mechanical falls this week.  She states the initial fall was minimal and caused small abrasions of the lateral ankle.  She was not having significant tenderness, but reports a fall yesterday in which she twisted her ankle.  She reports immediate pain and swelling of the lateral ankle with pain mostly to the medial aspect and has been unable to bear weight to his left foot since the second fall.  She uses a walker at baseline due to bilateral lower extremity weakness and prior stroke.  She denies other injuries including pain to her left knee or hip and numbness or weakness of the lower extremity.  No head injury, neck pain or LOC    Past Medical History:  Diagnosis Date  . Acute cystitis without hematuria   . Acute kidney failure ([place]) 02/28/2019  . Acute midline thoracic back pain 12/29/2019  . Acute on chronic renal failure ([place]) 02/28/2019  . Acute systolic heart failure ([place]) 10/21/2019  . Anemia   . Cataracts, bilateral    surgery to remove  . CATARACTS, BILATERAL 07/02/2007   Qualifier: Diagnosis of  By: Isla Pence    . Closed fracture of left femur ([place]) 10/28/2018  . Closed fracture of right ankle 11/06/2017  . Diabetes mellitus    type 2  . Emphysematous cystitis 08/26/2018  . Encounter for gynecological examination with Papanicolaou smear of cervix 01/14/2020  . Encounter for screening fecal occult blood testing 01/14/2020  . Encounter for screening for malignant neoplasm of cervix 12/29/2019  . Encounter for screening  mammogram for malignant neoplasm of breast 12/29/2019  . ESRD on hemodialysis ([place])   . GERD (gastroesophageal reflux disease)   . Hyperlipidemia   . Hypertension   . Hypokalemia 08/26/2018  . IRREGULAR MENSES 09/14/2009   Qualifier: Diagnosis of  By: Hassell Done FNP, Tori Milks    . Loose stools 11/12/2019  . Normocytic anemia 08/26/2018  . PARONYCHIA, RIGHT GREAT TOE 07/30/2008   Qualifier: Diagnosis of  By: Hassell Done FNP, Tori Milks    . Pressure ulcer 09/10/2019  . Right arm weakness 08/08/2019  . Sprain of left ankle   . STEMI (ST elevation myocardial infarction) ([place]) 10/18/2019  . STEMI involving right coronary artery ([place]) 10/18/2019  . Stroke ([place])   . SVD (spontaneous vaginal delivery)    x 4  . Vaginosis 08/26/2018  . Weakness 09/10/2019  . Weakness of both lower extremities     Patient Active Problem List   Diagnosis Date Noted  . Diabetic retinopathy ([place]) 05/04/2020  . Chronic diarrhea of unknown origin 01/14/2020  . Type 2 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, with long-term current use of insulin ([place]) 12/29/2019  . Hypothyroidism 12/29/2019  . Weakness of both lower extremities 11/12/2019  . Debility 09/10/2019  . ESRD (end stage renal disease) on dialysis ([place]) 09/10/2019  . Depression 09/10/2019  . Incontinence in female 09/10/2019  . Anemia in chronic kidney disease 08/19/2019  . Accelerated hypertension 08/08/2019  .  MGUS (monoclonal gammopathy of unknown significance) 07/23/2019  . GERD (gastroesophageal reflux disease) 02/28/2019  . Essential hypertension 10/28/2018  . Irregular menses 09/14/2009  . Hypercholesterolemia 11/19/2008  . Type 2 diabetes with nephropathy ([place]) 07/02/2007    Past Surgical History:  Procedure Laterality Date  . A/V FISTULAGRAM Left 01/12/2020   Procedure: A/V FISTULAGRAM;  Surgeon: Serafina Mitchell, MD;  Location: [place] [facility];  Service: Cardiovascular;  Laterality: Left;  . A/V FISTULAGRAM N/A 03/17/2020    Procedure: A/V FISTULAGRAM - Left Arm;  Surgeon: Marty Heck, MD;  Location: [place] [facility];  Service: Cardiovascular;  Laterality: N/A;  . AV FISTULA PLACEMENT Left 08/17/2019   Procedure: LEFT BRACHIAL CEPHALIC ARTERIOVENOUS (AV) FISTULA;  Surgeon: Angelia Mould, MD;  Location: [place];  Service: Vascular;  Laterality: Left;  . CORONARY STENT INTERVENTION N/A 10/18/2019   Procedure: CORONARY STENT INTERVENTION;  Surgeon: Sherren Mocha, MD;  Location: [place] [facility];  Service: Cardiovascular;  Laterality: N/A;  . CORONARY/GRAFT ACUTE MI REVASCULARIZATION N/A 10/18/2019   Procedure: Coronary/Graft Acute MI Revascularization;  Surgeon: Sherren Mocha, MD;  Location: [place] [facility];  Service: Cardiovascular;  Laterality: N/A;  . EYE SURGERY Bilateral    cataracts removed  . FEMUR IM NAIL Left 10/28/2018   Procedure: RETROGRADE FEMORAL NAILING;  Surgeon: Meredith Pel, MD;  Location: [place];  Service: Orthopedics;  Laterality: Left;  . IM NAILING FEMORAL SHAFT RETROGRADE Left 10/28/2018  . INTRAVASCULAR ULTRASOUND/IVUS N/A 10/18/2019   Procedure: Intravascular Ultrasound/IVUS;  Surgeon: Sherren Mocha, MD;  Location: [place] [facility];  Service: Cardiovascular;  Laterality: N/A;  . IR FLUORO GUIDE CV LINE RIGHT  08/11/2019  . IR THORACENTESIS ASP PLEURAL SPACE W/IMG GUIDE  11/12/2019  . IR US GUIDE VASC ACCESS RIGHT  08/11/2019  . KNEE SURGERY Left   . LEFT HEART CATH AND CORONARY ANGIOGRAPHY N/A 10/18/2019   Procedure: LEFT HEART CATH AND CORONARY ANGIOGRAPHY;  Surgeon: Sherren Mocha, MD;  Location: [place] [facility];  Service: Cardiovascular;  Laterality: N/A;  . PERIPHERAL VASCULAR BALLOON ANGIOPLASTY Left 01/12/2020   Procedure: PERIPHERAL VASCULAR BALLOON ANGIOPLASTY;  Surgeon: Serafina Mitchell, MD;  Location: [place] [facility];  Service: Cardiovascular;  Laterality: Left;  AVF  . PERIPHERAL VASCULAR BALLOON ANGIOPLASTY Left 03/17/2020   Procedure: PERIPHERAL  VASCULAR BALLOON ANGIOPLASTY;  Surgeon: Marty Heck, MD;  Location: [place] [facility];  Service: Cardiovascular;  Laterality: Left;  AVF  . PERIPHERAL VASCULAR BALLOON ANGIOPLASTY Left 05/05/2020   Procedure: PERIPHERAL VASCULAR BALLOON ANGIOPLASTY;  Surgeon: Marty Heck, MD;  Location: [place] [facility];  Service: Cardiovascular;  Laterality: Left;  arm fistula  . RADIOLOGY WITH ANESTHESIA N/A 09/15/2019   Procedure: [facility] AND LUMBER LOWER BACK PAIN;  Surgeon: Radiologist, Medication, MD;  Location: [place];  Service: Radiology;  Laterality: N/A;  . TUBAL LIGATION       OB History    Gravida  4   Para  4   Term  4   Preterm      AB      Living  4     SAB      TAB      Ectopic      Multiple      Live Births  4           Family History  Problem Relation Age of Onset  . Diabetes Mother   . Hypertension Mother   . Bipolar disorder Mother   .  Brain cancer Maternal Aunt   . Heart disease Maternal Grandmother   . Diabetes Maternal Grandmother   . Breast cancer Maternal Aunt   . Depression Father        Committed suicide  . Diabetes Sister     Social History   Tobacco Use  . Smoking status: Former Smoker    Packs/day: 0.25    Years: 2.00    Pack years: 0.50    Types: Cigarettes    Quit date: 1997    Years since quitting: 24.8  . Smokeless tobacco: Never Used  Vaping Use  . Vaping Use: Never used  Substance Use Topics  . Alcohol use: No  . Drug use: No    Home Medications Prior to Admission medications   Medication Sig Start Date End Date Taking? Authorizing Provider  aspirin 81 MG EC tablet Take 1 tablet (81 mg total) by mouth daily. 01/13/20   Perlie Mayo, NP  atorvastatin (LIPITOR) 80 MG tablet Take 1 tablet (80 mg total) by mouth daily at 6 PM. 11/24/19   Kathlen Mody, Nicki Reaper T, PA-C  carvedilol (COREG) 6.25 MG tablet Take 1.5 tablets (9.375 mg total) by mouth 2 (two) times daily. 01/13/20   Perlie Mayo, NP  epoetin alfa (EPOGEN)  3000 UNIT/ML injection Inject 3,000 Units into the skin every 14 (fourteen) days.  06/12/19   [provider]  gabapentin (NEURONTIN) 300 MG capsule Take 1 capsule (300 mg total) by mouth at bedtime. 02/18/20   Perlie Mayo, NP  hydrALAZINE (APRESOLINE) 10 MG tablet Take 1 tablet (10 mg total) by mouth in the morning and at bedtime. 11/24/19   Richardson Dopp T, PA-C  insulin aspart protamine- aspart (NOVOLOG MIX 70/30) (70-30) 100 UNIT/ML injection Inject 0.1 mLs (10 Units total) into the skin 2 (two) times daily with a meal. 03/30/20   Perlie Mayo, NP  Insulin Syringes, Disposable, U-100 1 ML MISC Use to inject insulin twice daily dx e11.65 03/31/20   Perlie Mayo, NP  isosorbide mononitrate (IMDUR) 30 MG 24 hr tablet Take 0.5 tablets (15 mg total) by mouth daily. 01/05/20 01/04/21  Richardson Dopp T, PA-C  levothyroxine (SYNTHROID) 25 MCG tablet Take 1 tablet (25 mcg total) by mouth daily before breakfast. 02/18/20   Perlie Mayo, NP  multivitamin (RENA-VIT) TABS tablet Take 1 tablet by mouth at bedtime. 08/25/19   Hoyt Koch, MD  nitroGLYCERIN (NITROSTAT) 0.4 MG SL tablet Place 1 tablet (0.4 mg total) under the tongue every 5 (five) minutes x 3 doses as needed for chest pain. Patient taking differently: Place 0.4 mg under the tongue every 5 (five) minutes as needed for chest pain (max 3 doses).  11/24/19   Richardson Dopp T, PA-C  ondansetron (ZOFRAN ODT) 4 MG disintegrating tablet 4mg  ODT q4 hours prn nausea/vomit Patient not taking: Reported on 05/05/2020 02/26/20   Milton Ferguson, MD  sertraline (ZOLOFT) 25 MG tablet Take 1 tablet (25 mg total) by mouth daily. 02/18/20 05/05/20  Perlie Mayo, NP  tamsulosin (FLOMAX) 0.4 MG CAPS capsule Take 1 capsule (0.4 mg total) by mouth daily after supper. 02/18/20   Perlie Mayo, NP  ticagrelor (BRILINTA) 90 MG TABS tablet Take 1 tablet (90 mg total) by mouth 2 (two) times daily. 11/24/19   Richardson Dopp T, PA-C    Allergies      Contrast media [iodinated diagnostic agents] and Penicillins  Review of Systems   Review of Systems  Constitutional: Negative for chills  and fever.  Gastrointestinal: Negative for abdominal pain, nausea and vomiting.  Genitourinary: Negative for difficulty urinating and dysuria.  Musculoskeletal: Positive for arthralgias (Left ankle pain and swelling) and joint swelling.  Skin: Negative for color change and wound.  Neurological: Negative for dizziness, syncope, weakness and headaches.    Physical Exam Updated Vital Signs BP (!) 155/68 (BP Location: Left Arm)   Pulse 88   Temp 99 F (37.2 C) (Oral)   Resp 18   Ht 5\' 3"  (1.6 m)   Wt 64 kg   LMP 12/17/2016   SpO2 96%   BMI 24.99 kg/m   Physical Exam Vitals and nursing note reviewed.  Constitutional:      Appearance: Normal appearance. She is not ill-appearing.  Cardiovascular:     Rate and Rhythm: Normal rate and regular rhythm.     Pulses: Normal pulses.  Pulmonary:     Effort: Pulmonary effort is normal.     Breath sounds: Normal breath sounds.  Chest:     Chest wall: No tenderness.  Musculoskeletal:        General: Swelling, tenderness and signs of injury present.     Cervical back: Normal range of motion. No tenderness.     Comments: ttp of the medial and lateral left ankle joint.  Moderate edema laterally.  No obvious bony deformity.  Compartments are soft.    Skin:    General: Skin is warm.     Capillary Refill: Capillary refill takes less than 2 seconds.     Comments: Several small abrasions to the anterolateral left ankle.  No surrounding erythema.  Abrasions appear to be healing well  Neurological:     General: No focal deficit present.     Mental Status: She is alert.     Sensory: No sensory deficit.     Motor: No weakness.     ED Results / Procedures / Treatments   Labs (all labs ordered are listed, but only abnormal results are displayed) Labs Reviewed - No data to  display  EKG None  Radiology DG Ankle Complete Left  Result Date: 05/07/2020 CLINICAL DATA:  Fall, twisting ankle. EXAM: LEFT ANKLE COMPLETE - 3+ VIEW COMPARISON:  None. FINDINGS: Fracture noted the base of the medial malleolus. Avulsion fracture off the lateral talus. There appears to be irregularity of the lateral talar dome. Widening of the ankle mortise. Diffuse soft tissue swelling. IMPRESSION: Fracture the base of the medial malleolus. Concern for fracture through the lateral talar dome. Avulsion fracture off the lateral talus. Electronically Signed   By: Rolm Baptise M.D.   On: 05/07/2020 21:28   Procedures Procedures (including critical care time)   Medications Ordered in ED Medications - No data to display  ED Course  I have reviewed the triage vital signs and the nursing notes.  Pertinent labs & imaging results that were available during my care of the patient were reviewed by me and considered in my medical decision making (see chart for details).    MDM Rules/Calculators/A&P                          Patient here with mechanical fall x2 this week.  Last fall yesterday and describes a inversion injury.  Neurovascularly intact.  Significant edema to the lateral ankle with tenderness medially.  No proximal tenderness.  X-ray shows widening of the ankle mortise with fracture at the base of the medial malleolus and possible fracture of the  lateral talar dome.  Patient is ambulatory with walker at baseline.  Posterior and stirrup splint applied.    On recheck, remains neurovascularly intact after splint application.  She states she has limited transportation and prefers orthopedic follow-up locally.  The patient appears reasonably screened and/or stabilized for discharge and I doubt any other medical condition or other [facility] requiring further screening, evaluation, or treatment in the ED at this time prior to discharge.    Final Clinical Impression(s) / ED Diagnoses Final  diagnoses:  Ankle fracture, left, closed, initial encounter    Rx / DC Orders ED Discharge Orders    None       Kem Parkinson, PA-C 05/07/20 2313    Veryl Speak, MD 05/08/20 2224

## 2020-05-08 DIAGNOSIS — Z992 Dependence on renal dialysis: Secondary | ICD-10-CM | POA: Diagnosis not present

## 2020-05-08 DIAGNOSIS — N186 End stage renal disease: Secondary | ICD-10-CM | POA: Diagnosis not present

## 2020-05-09 ENCOUNTER — Telehealth: Payer: Self-pay | Admitting: *Deleted

## 2020-05-09 NOTE — Telephone Encounter (Signed)
Transition Care Management Unsuccessful Follow-up Telephone Call  Date of discharge and from where:  05/08/20 - [facility]  Attempts:  1st Attempt  Reason for unsuccessful TCM follow-up call:  Voice mail full

## 2020-05-10 DIAGNOSIS — E113512 Type 2 diabetes mellitus with proliferative diabetic retinopathy with macular edema, left eye: Secondary | ICD-10-CM | POA: Diagnosis not present

## 2020-05-12 ENCOUNTER — Other Ambulatory Visit: Payer: Self-pay

## 2020-05-12 ENCOUNTER — Encounter: Payer: Self-pay | Admitting: Orthopaedic Surgery

## 2020-05-12 ENCOUNTER — Ambulatory Visit (INDEPENDENT_AMBULATORY_CARE_PROVIDER_SITE_OTHER): Payer: Medicaid Other | Admitting: Orthopaedic Surgery

## 2020-05-12 VITALS — BP 169/83 | HR 84 | Ht 63.0 in | Wt 141.0 lb

## 2020-05-12 DIAGNOSIS — N186 End stage renal disease: Secondary | ICD-10-CM

## 2020-05-12 DIAGNOSIS — S92152A Displaced avulsion fracture (chip fracture) of left talus, initial encounter for closed fracture: Secondary | ICD-10-CM

## 2020-05-12 DIAGNOSIS — S8255XA Nondisplaced fracture of medial malleolus of left tibia, initial encounter for closed fracture: Secondary | ICD-10-CM

## 2020-05-12 DIAGNOSIS — Z794 Long term (current) use of insulin: Secondary | ICD-10-CM | POA: Diagnosis not present

## 2020-05-12 DIAGNOSIS — E1122 Type 2 diabetes mellitus with diabetic chronic kidney disease: Secondary | ICD-10-CM | POA: Diagnosis not present

## 2020-05-12 DIAGNOSIS — Z992 Dependence on renal dialysis: Secondary | ICD-10-CM | POA: Diagnosis not present

## 2020-05-12 MED ORDER — HYDROCODONE-ACETAMINOPHEN 5-325 MG PO TABS: ORAL_TABLET | ORAL | 0 refills | Status: DC

## 2020-05-12 NOTE — Telephone Encounter (Signed)
Message will be closed as we are outside of the 72 hour window to contact patient.  °

## 2020-05-12 NOTE — Progress Notes (Signed)
Subjective:    Patient ID: Melanie Hall, female    DOB: [birth year], 49 y.o.   MRN: [id number]  HPI She tripped while going to mail box on 05-07-2020 and hurt her left ankle.  She was seen in the ER.  X-rays were done and showed: IMPRESSION: Fracture the base of the medial malleolus. Concern for fracture through the lateral talar dome. Avulsion fracture off the lateral Talus.  I have explained the X-rays to her.  I want to get a CT of the ankle to access the talus better.  She is to use CAM walker and walker.  I have independently reviewed and interpreted x-rays of this patient done at another site by another physician or qualified health professional.  I have reviewed the ER notes.  I will get CT scan and continue toe-touch only.  I will see her back Tuesday.  She is on dialysis [id number].  She has eye problems and is diabetic.   Review of Systems  Constitutional: Positive for activity change.  Musculoskeletal: Positive for arthralgias, gait problem and joint swelling.  All other systems reviewed and are negative.  For Review of Systems, all other systems reviewed and are negative.  The following is a summary of the past history medically, past history surgically, known current medicines, social history and family history.  This information is gathered electronically by the computer from prior information and documentation.  I review this each visit and have found including this information at this point in the chart is beneficial and informative.   Past Medical History:  Diagnosis Date  . Acute cystitis without hematuria   . Acute kidney failure ([place]) 02/28/2019  . Acute midline thoracic back pain 12/29/2019  . Acute on chronic renal failure ([place]) 02/28/2019  . Acute systolic heart failure ([place]) 10/21/2019  . Anemia   . Cataracts, bilateral    surgery to remove  . CATARACTS, BILATERAL 07/02/2007   Qualifier: Diagnosis of  By: Isla Pence    . Closed fracture of left femur  ([place]) 10/28/2018  . Closed fracture of right ankle 11/06/2017  . Diabetes mellitus    type 2  . Emphysematous cystitis 08/26/2018  . Encounter for gynecological examination with Papanicolaou smear of cervix 01/14/2020  . Encounter for screening fecal occult blood testing 01/14/2020  . Encounter for screening for malignant neoplasm of cervix 12/29/2019  . Encounter for screening mammogram for malignant neoplasm of breast 12/29/2019  . ESRD on hemodialysis ([place])   . GERD (gastroesophageal reflux disease)   . Hyperlipidemia   . Hypertension   . Hypokalemia 08/26/2018  . IRREGULAR MENSES 09/14/2009   Qualifier: Diagnosis of  By: Hassell Done FNP, Tori Milks    . Loose stools 11/12/2019  . Normocytic anemia 08/26/2018  . PARONYCHIA, RIGHT GREAT TOE 07/30/2008   Qualifier: Diagnosis of  By: Hassell Done FNP, Tori Milks    . Pressure ulcer 09/10/2019  . Right arm weakness 08/08/2019  . Sprain of left ankle   . STEMI (ST elevation myocardial infarction) ([place]) 10/18/2019  . STEMI involving right coronary artery ([place]) 10/18/2019  . Stroke ([place])   . SVD (spontaneous vaginal delivery)    x 4  . Vaginosis 08/26/2018  . Weakness 09/10/2019  . Weakness of both lower extremities     Past Surgical History:  Procedure Laterality Date  . A/V FISTULAGRAM Left 01/12/2020   Procedure: A/V FISTULAGRAM;  Surgeon: Serafina Mitchell, MD;  Location: [facility];  Service: Cardiovascular;  Laterality: Left;  . A/V  FISTULAGRAM N/A 03/17/2020   Procedure: A/V FISTULAGRAM - Left Arm;  Surgeon: Marty Heck, MD;  Location: [facility];  Service: Cardiovascular;  Laterality: N/A;  . AV FISTULA PLACEMENT Left 08/17/2019   Procedure: LEFT BRACHIAL CEPHALIC ARTERIOVENOUS (AV) FISTULA;  Surgeon: Angelia Mould, MD;  Location: [facility];  Service: Vascular;  Laterality: Left;  . CORONARY STENT INTERVENTION N/A 10/18/2019   Procedure: CORONARY STENT INTERVENTION;  Surgeon: Sherren Mocha, MD;  Location: [facility];  Service:  Cardiovascular;  Laterality: N/A;  . CORONARY/GRAFT ACUTE MI REVASCULARIZATION N/A 10/18/2019   Procedure: Coronary/Graft Acute MI Revascularization;  Surgeon: Sherren Mocha, MD;  Location: [facility];  Service: Cardiovascular;  Laterality: N/A;  . EYE SURGERY Bilateral    cataracts removed  . FEMUR IM NAIL Left 10/28/2018   Procedure: RETROGRADE FEMORAL NAILING;  Surgeon: Meredith Pel, MD;  Location: [facility];  Service: Orthopedics;  Laterality: Left;  . IM NAILING FEMORAL SHAFT RETROGRADE Left 10/28/2018  . INTRAVASCULAR ULTRASOUND/IVUS N/A 10/18/2019   Procedure: Intravascular Ultrasound/IVUS;  Surgeon: Sherren Mocha, MD;  Location: [facility];  Service: Cardiovascular;  Laterality: N/A;  . IR FLUORO GUIDE CV LINE RIGHT  08/11/2019  . IR THORACENTESIS ASP PLEURAL SPACE W/IMG GUIDE  11/12/2019  . IR US GUIDE VASC ACCESS RIGHT  08/11/2019  . KNEE SURGERY Left   . LEFT HEART CATH AND CORONARY ANGIOGRAPHY N/A 10/18/2019   Procedure: LEFT HEART CATH AND CORONARY ANGIOGRAPHY;  Surgeon: Sherren Mocha, MD;  Location: [facility];  Service: Cardiovascular;  Laterality: N/A;  . PERIPHERAL VASCULAR BALLOON ANGIOPLASTY Left 01/12/2020   Procedure: PERIPHERAL VASCULAR BALLOON ANGIOPLASTY;  Surgeon: Serafina Mitchell, MD;  Location: [facility];  Service: Cardiovascular;  Laterality: Left;  AVF  . PERIPHERAL VASCULAR BALLOON ANGIOPLASTY Left 03/17/2020   Procedure: PERIPHERAL VASCULAR BALLOON ANGIOPLASTY;  Surgeon: Marty Heck, MD;  Location: [facility];  Service: Cardiovascular;  Laterality: Left;  AVF  . PERIPHERAL VASCULAR BALLOON ANGIOPLASTY Left 05/05/2020   Procedure: PERIPHERAL VASCULAR BALLOON ANGIOPLASTY;  Surgeon: Marty Heck, MD;  Location: [place] [facility] [facility];  Service: Cardiovascular;  Laterality: Left;  arm fistula  . RADIOLOGY WITH ANESTHESIA N/A 09/15/2019   Procedure: [facility] AND LUMBER LOWER BACK PAIN;  Surgeon: Radiologist, Medication,  MD;  Location: [facility];  Service: Radiology;  Laterality: N/A;  . TUBAL LIGATION      Current Outpatient Medications on File Prior to Visit  Medication Sig Dispense Refill  . aspirin 81 MG EC tablet Take 1 tablet (81 mg total) by mouth daily. 90 tablet 3  . atorvastatin (LIPITOR) 80 MG tablet Take 1 tablet (80 mg total) by mouth daily at 6 PM. 90 tablet 3  . carvedilol (COREG) 6.25 MG tablet Take 1.5 tablets (9.375 mg total) by mouth 2 (two) times daily. 180 tablet 3  . epoetin alfa (EPOGEN) 3000 UNIT/ML injection Inject 3,000 Units into the skin every 14 (fourteen) days.     Marland Kitchen gabapentin (NEURONTIN) 300 MG capsule Take 1 capsule (300 mg total) by mouth at bedtime. 90 capsule 0  . hydrALAZINE (APRESOLINE) 10 MG tablet Take 1 tablet (10 mg total) by mouth in the morning and at bedtime. 180 tablet 3  . insulin aspart protamine- aspart (NOVOLOG MIX 70/30) (70-30) 100 UNIT/ML injection Inject 0.1 mLs (10 Units total) into the skin 2 (two) times daily with a meal. 10 mL 3  . Insulin Syringes, Disposable, U-100 1  ML MISC Use to inject insulin twice daily dx e11.65 100 each 5  . isosorbide mononitrate (IMDUR) 30 MG 24 hr tablet Take 0.5 tablets (15 mg total) by mouth daily. 15 tablet 11  . levothyroxine (SYNTHROID) 25 MCG tablet Take 1 tablet (25 mcg total) by mouth daily before breakfast. 30 tablet 1  . multivitamin (RENA-VIT) TABS tablet Take 1 tablet by mouth at bedtime. 30 tablet 0  . nitroGLYCERIN (NITROSTAT) 0.4 MG SL tablet Place 1 tablet (0.4 mg total) under the tongue every 5 (five) minutes x 3 doses as needed for chest pain. (Patient taking differently: Place 0.4 mg under the tongue every 5 (five) minutes as needed for chest pain (max 3 doses). ) 25 tablet 3  . ondansetron (ZOFRAN ODT) 4 MG disintegrating tablet 4mg  ODT q4 hours prn nausea/vomit (Patient not taking: Reported on 05/05/2020) 12 tablet 0  . sertraline (ZOLOFT) 25 MG tablet Take 1 tablet (25 mg total) by mouth daily. 30 tablet 0  .  tamsulosin (FLOMAX) 0.4 MG CAPS capsule Take 1 capsule (0.4 mg total) by mouth daily after supper. 90 capsule 0  . ticagrelor (BRILINTA) 90 MG TABS tablet Take 1 tablet (90 mg total) by mouth 2 (two) times daily. 180 tablet 3   Current Facility-Administered Medications on File Prior to Visit  Medication Dose Route Frequency Provider Last Rate Last Admin  . 0.9 %  sodium chloride infusion  250 mL Intravenous PRN Marty Heck, MD      . 0.9 %  sodium chloride infusion  250 mL Intravenous PRN Marty Heck, MD      . sodium chloride flush (NS) 0.9 % injection 3 mL  3 mL Intravenous Q12H Marty Heck, MD      . sodium chloride flush (NS) 0.9 % injection 3 mL  3 mL Intravenous PRN Marty Heck, MD        Social History   Socioeconomic History  . Marital status: Widowed    Spouse name: Not on file  . Number of children: 4  . Years of education: Not on file  . Highest education level: 10th grade  Occupational History  . Not on file  Tobacco Use  . Smoking status: Former Smoker    Packs/day: 0.25    Years: 2.00    Pack years: 0.50    Types: Cigarettes    Quit date: 1997    Years since quitting: 24.8  . Smokeless tobacco: Never Used  Vaping Use  . Vaping Use: Never used  Substance and Sexual Activity  . Alcohol use: No  . Drug use: No  . Sexual activity: Yes    Birth control/protection: Surgical    Comment: tubal   Other Topics Concern  . Not on file  Social History Narrative   Lives with friend with Joelene Millin   Cats: [place]era, [place], [place], another kitten      Enjoy: watching TV-[employer], [employer], and [employer]      Diet: eats all food groups: focuses on Dm and Kidney friendly foods    Caffeine: none   Water: Fluid Restriction 32 ounces a day          Wears seat belt: not currently driving    Oceanographer at home    No weapons    Social Determinants of Health   Financial Resource Strain: Low Risk   . Difficulty of Paying Living Expenses: Not  hard at all  Food Insecurity: No Food Insecurity  . Worried About  Running Out of Food in the Last Year: Never true  . Ran Out of Food in the Last Year: Never true  Transportation Needs: No Transportation Needs  . Lack of Transportation (Medical): No  . Lack of Transportation (Non-Medical): No  Physical Activity: Inactive  . Days of Exercise per Week: 0 days  . Minutes of Exercise per Session: 0 min  Stress: No Stress Concern Present  . Feeling of Stress : Not at all  Social Connections: Socially Isolated  . Frequency of Communication with Friends and Family: Never  . Frequency of Social Gatherings with Friends and Family: Never  . Attends Religious Services: Never  . Active Member of Clubs or Organizations: No  . Attends Archivist Meetings: Never  . Marital Status: Widowed  Intimate Partner Violence: Not At Risk  . Fear of Current or Ex-Partner: No  . Emotionally Abused: No  . Physically Abused: No  . Sexually Abused: No    Family History  Problem Relation Age of Onset  . Diabetes Mother   . Hypertension Mother   . Bipolar disorder Mother   . Brain cancer Maternal Aunt   . Heart disease Maternal Grandmother   . Diabetes Maternal Grandmother   . Breast cancer Maternal Aunt   . Depression Father        Committed suicide  . Diabetes Sister     BP (!) 169/83   Pulse 84   Ht 5\' 3"  (1.6 m)   Wt 141 lb (64 kg)   LMP 12/17/2016   BMI 24.98 kg/m   Body mass index is 24.98 kg/m.     Objective:   Physical Exam Vitals and nursing note reviewed. Exam conducted with a chaperone present.  Constitutional:      Appearance: She is well-developed.  HENT:     Head: Normocephalic and atraumatic.  Eyes:     Conjunctiva/sclera: Conjunctivae normal.     Pupils: Pupils are equal, round, and reactive to light.  Cardiovascular:     Rate and Rhythm: Normal rate and regular rhythm.  Pulmonary:     Effort: Pulmonary effort is normal.  Abdominal:     Palpations: Abdomen  is soft.  Musculoskeletal:     Cervical back: Normal range of motion and neck supple.       Feet:  Skin:    General: Skin is warm and dry.  Neurological:     Mental Status: She is alert and oriented to person, place, and time.     Cranial Nerves: No cranial nerve deficit.     Motor: No abnormal muscle tone.     Coordination: Coordination normal.     Deep Tendon Reflexes: Reflexes are normal and symmetric. Reflexes normal.  Psychiatric:        Behavior: Behavior normal.        Thought Content: Thought content normal.        Judgment: Judgment normal.           Assessment & Plan:   Encounter Diagnoses  Name Primary?  . Closed avulsion fracture of talus, left, initial encounter   . Closed nondisplaced fracture of medial malleolus of left tibia, initial encounter Yes  . ESRD (end stage renal disease) on dialysis ([place])   . Type 2 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, with long-term current use of insulin ([facility])    For CT scan of the ankle.  Return Tuesday.  I have reviewed the [place] Controlled Substance Reporting System web  site prior to prescribing narcotic medicine for this patient.   Pain medicine given.  CAM walker given.  Call if any problem.  Precautions discussed.  Electronically Signed Sanjuana Kava, MD 11/4/20219:36 AM

## 2020-05-17 ENCOUNTER — Other Ambulatory Visit: Payer: Self-pay

## 2020-05-17 ENCOUNTER — Ambulatory Visit (HOSPITAL_COMMUNITY)
Admission: RE | Admit: 2020-05-17 | Discharge: 2020-05-17 | Disposition: A | Discharge status: Home / Self Care | Payer: Medicaid Other | Source: Ambulatory Visit | Attending: Orthopaedic Surgery | Admitting: Orthopaedic Surgery

## 2020-05-17 DIAGNOSIS — S92212A Displaced fracture of cuboid bone of left foot, initial encounter for closed fracture: Secondary | ICD-10-CM | POA: Diagnosis not present

## 2020-05-17 DIAGNOSIS — S92152A Displaced avulsion fracture (chip fracture) of left talus, initial encounter for closed fracture: Secondary | ICD-10-CM | POA: Diagnosis not present

## 2020-05-17 DIAGNOSIS — E113511 Type 2 diabetes mellitus with proliferative diabetic retinopathy with macular edema, right eye: Secondary | ICD-10-CM | POA: Diagnosis not present

## 2020-05-17 DIAGNOSIS — S82855A Nondisplaced trimalleolar fracture of left lower leg, initial encounter for closed fracture: Secondary | ICD-10-CM | POA: Diagnosis not present

## 2020-05-17 DIAGNOSIS — S92102A Unspecified fracture of left talus, initial encounter for closed fracture: Secondary | ICD-10-CM | POA: Diagnosis not present

## 2020-05-17 IMAGING — CT CT ANKLE*L* W/O CM
4 of 6 series · 14 of 34 positions shown, 16 images · non-contrast
Comparison: Plain films left ankle [DATE].

CLINICAL DATA: The patient suffered a fall resulting in left ankle
fractures [DATE]. Subsequent encounter.

EXAM:
CT OF THE LEFT ANKLE WITHOUT CONTRAST
TECHNIQUE: Multidetector CT imaging of the left ankle was performed according
to the standard protocol. Multiplanar CT image reconstructions were
also generated.

[Series 4: axial bone · axial · 0.29mm/px · z∈[-24,+78]mm · 5 of 158 slices shown, 7 images]
[im 27/158  soft-tissue]
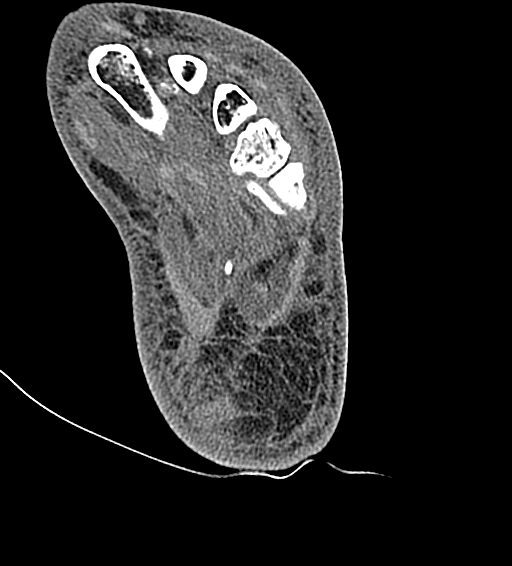
[im 27/158  bone]
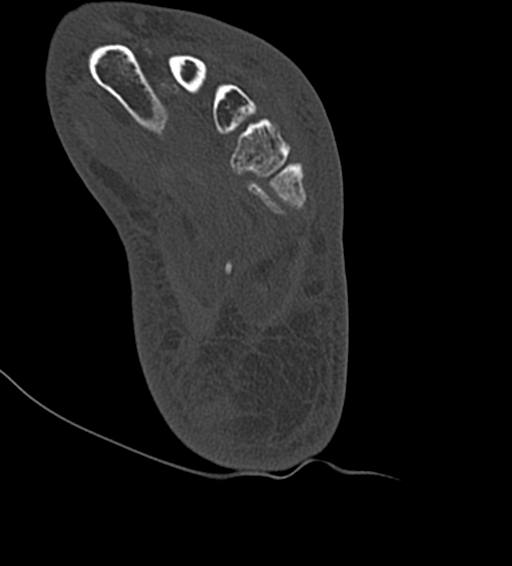
[im 53/158  bone]
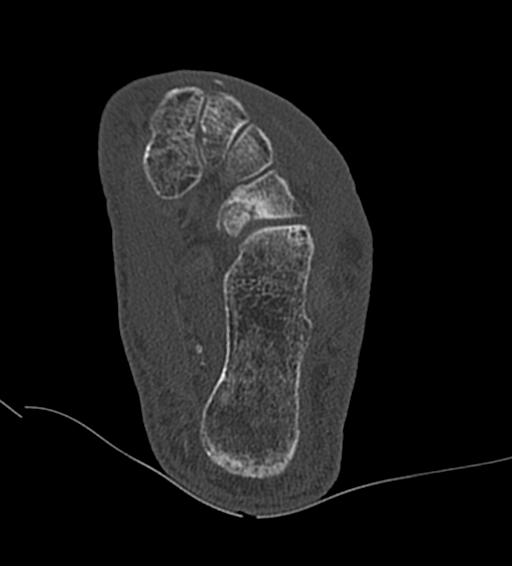
[im 79/158  bone]
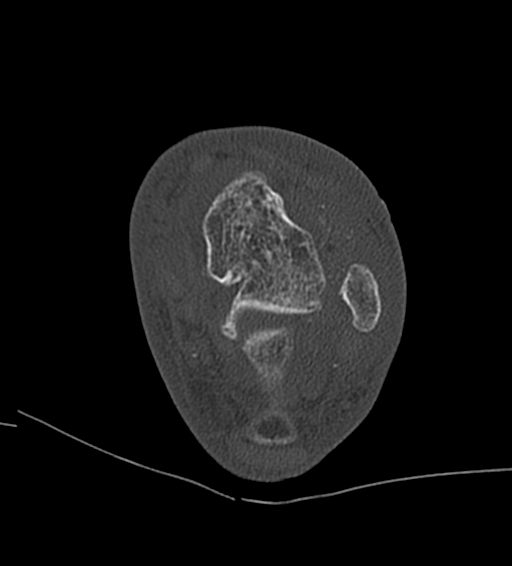
[im 105/158  bone]
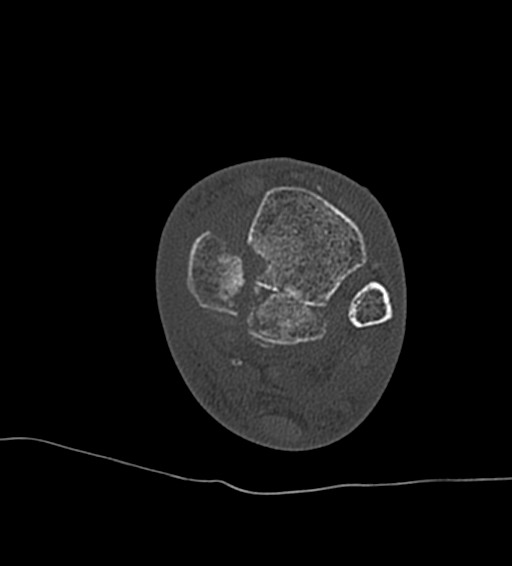
[im 131/158  soft-tissue]
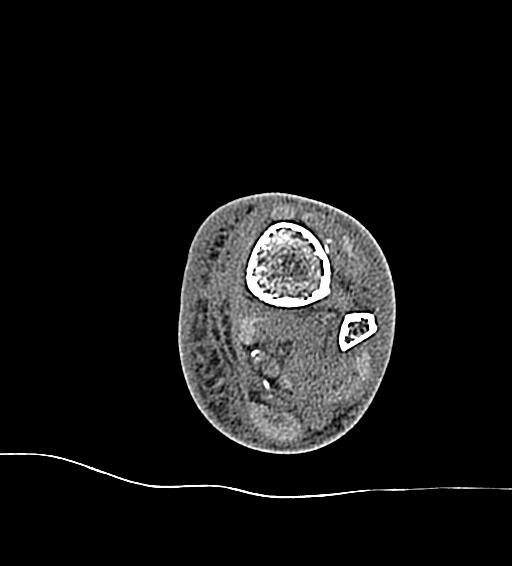
[im 131/158  bone]
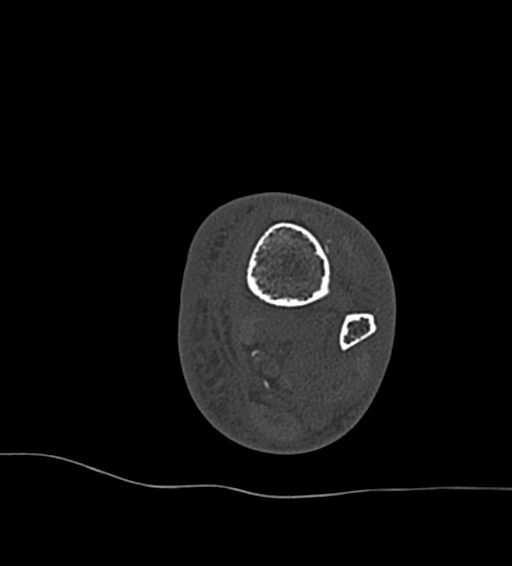

[Series 5: cor bone · coronal · 0.28mm/px · 1 of 149 slices shown]
[im 75/149  bone]
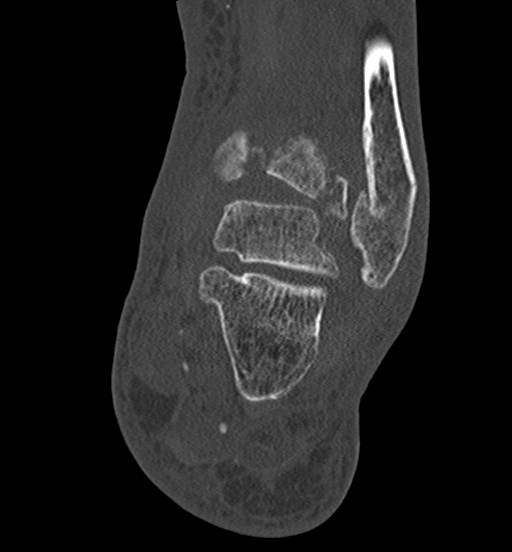

[Series 6: sag bone · sagittal · 0.30mm/px · 6 of 119 slices shown]
[im 20/119  bone]
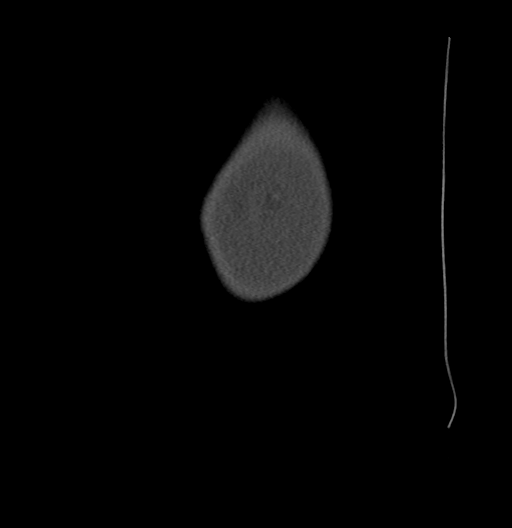
[im 29/119  soft-tissue]
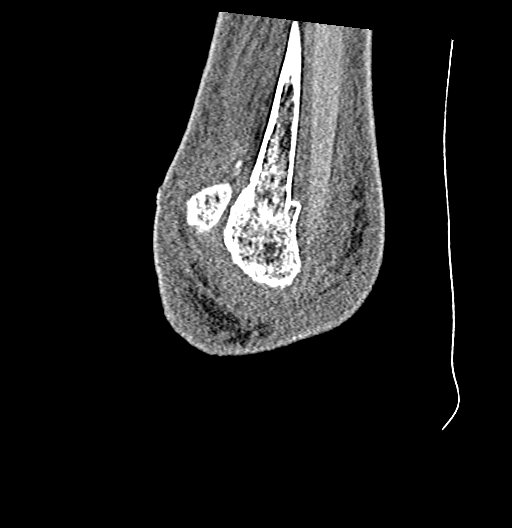
[im 40/119  bone]
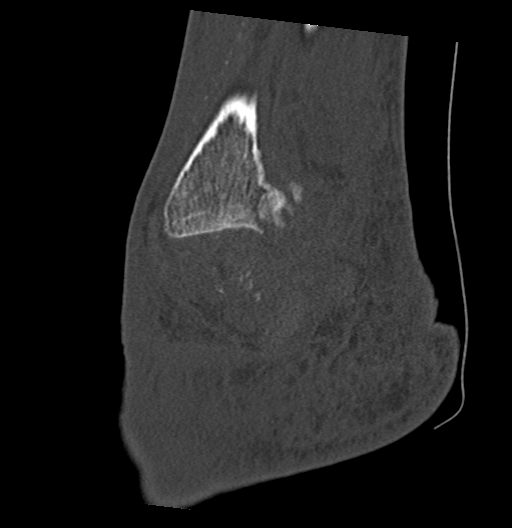
[im 60/119  bone]
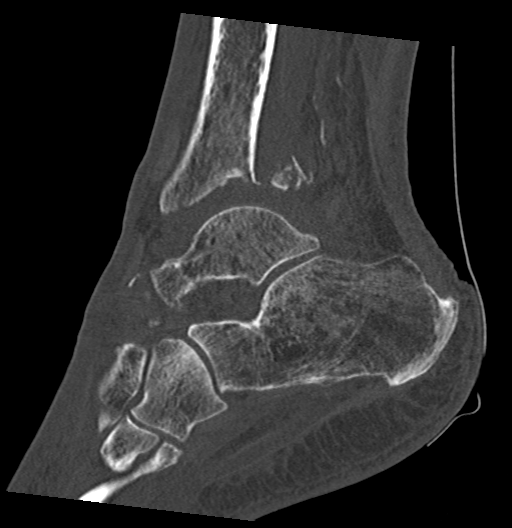
[im 79/119  bone]
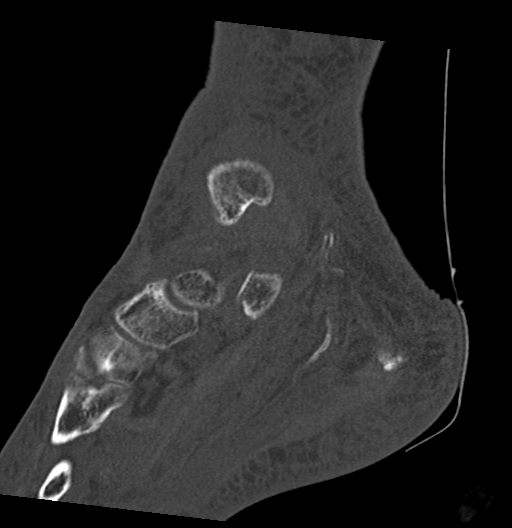
[im 99/119  bone]
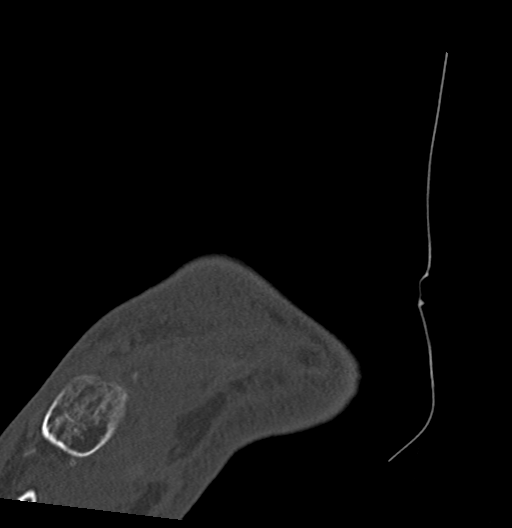

[Series 7: axial st · axial · 0.23mm/px · z∈[-21,+5]mm · 2 of 159 slices shown]
[im 27/159  bone]
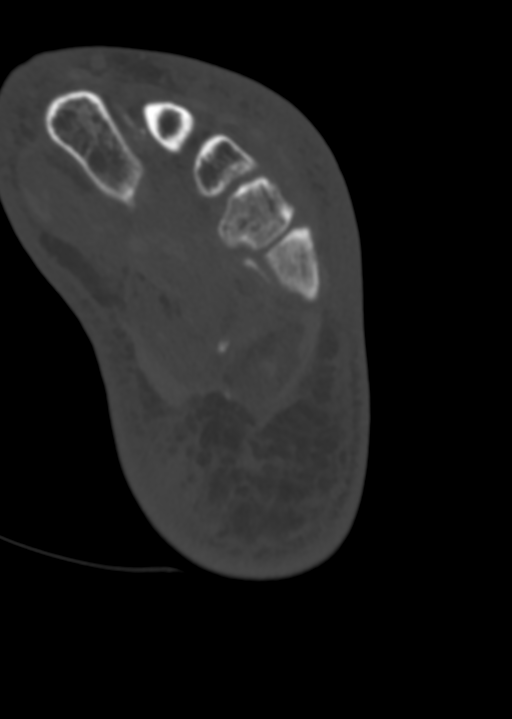
[im 53/159  bone]
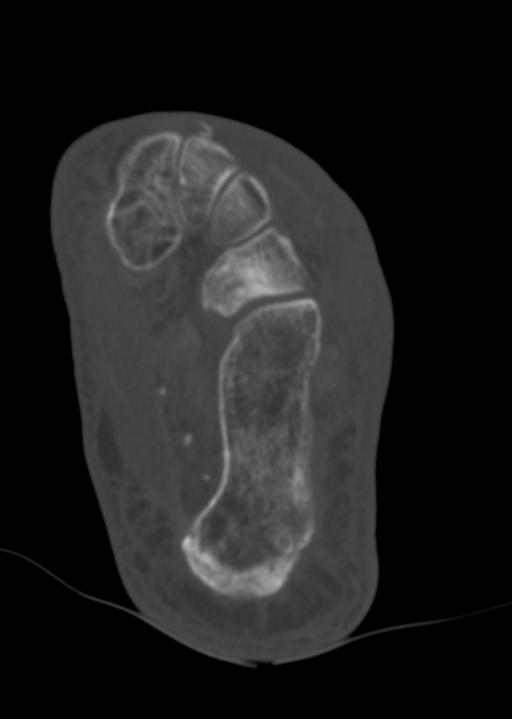

[14 of 34 positions shown; findings below may reference images not displayed]

FINDINGS: Bones/Joint/Cartilage

The patient has a mildly comminuted fracture of the posterior
malleolus of the tibia. The fracture fragment measures 2.5 cm
transverse by 1.5 cm AP at the articular surface by 1.8 cm
craniocaudal. The fragment is triangular in shape with its apex
superiorly. Fragment is posteriorly displaced approximately 1.4 cm
resulting in a large defect in the articular surface of the tibia.
There is early remodeling of the tibia at the site of this defect.
The talus is laterally displaced approximately 1.3 cm.

The patient also has a fracture of the medial malleolus. The
fragment is medially displaced 0.7 cm and superiorly displaced
approximately 1 cm.

Also seen is a mildly impacted fracture of the lateral malleolus.
There is approximately 30 degrees medial angulation of the distal
fracture fragment.

Also seen is a nondisplaced fracture of the cuboid. The fracture
extends from the central aspect of the articular surface of the
calcaneocuboid joint distally through the mid aspect of the cuboid
superiorly.

Nondisplaced fracture of the talus at the middle facet of the
subtalar joint is identified.

No other fracture is seen.

Ligaments

Suboptimally assessed by CT.

Muscles and Tendons

As visualized by CT scan, tendons appear intact. No evidence of
tendon entrapment.

Soft tissues

Marked soft tissue swelling is present about the ankle. There is
synovitis about the ankle joint.
IMPRESSION: Displaced trimalleolar fracture as described above. As noted, the
talus is laterally displaced up to 1.3 cm off the plafond and
posterior displacement large fragment of the posterior malleolus
results in a gap at the articular surface which demonstrates early
remodeling compatible with pseudoarthrosis formation.

Acute, nondisplaced fractures of the cuboid and talus at the middle
facet of the subtalar joint.

Soft tissue swelling and synovitis about the ankle.

## 2020-05-18 ENCOUNTER — Other Ambulatory Visit: Payer: Self-pay | Admitting: *Deleted

## 2020-05-18 NOTE — Patient Outreach (Signed)
Care Coordination  05/18/2020  Melanie Hall 07-01-1971 [id number]   An unsuccessful telephone outreach was attempted today. The patient was referred to the case management team for assistance with care management and care coordination.   Follow Up Plan: A HIPAA compliant phone message was left for the patient providing contact information and requesting a return call.  The Managed Medicaid care management team will reach out to the patient again over the next 7-14 days.   Lurena Joiner RN, BSN [place]  [employer] manager

## 2020-05-18 NOTE — Patient Instructions (Signed)
Visit Information  Ms. Melanie Hall  - as a part of your Medicaid benefit, you are eligible for care management and care coordination services at no cost or copay. I was unable to reach you by phone today but would be happy to help you with your health related needs. Please feel free to call me @ [phone].   A member of the Managed Medicaid care management team will reach out to you again over the next 7-14 days.   Lurena Joiner RN, BSN [place] RN Care Coordinator

## 2020-05-19 ENCOUNTER — Ambulatory Visit: Payer: Medicaid Other | Admitting: Orthopaedic Surgery

## 2020-05-19 ENCOUNTER — Telehealth: Payer: Self-pay | Admitting: Orthopaedic Surgery

## 2020-05-19 NOTE — Telephone Encounter (Signed)
Patient called earlier this morning to relay that her transportation did not pick her up for today's appointment (05/19/20). Patient has rescheduled for 05/24/20. Requests refill if possible on medication: HYDROcodone-acetaminophen (NORCO/VICODIN) 5-325 MG tablet 30 tablet  -[place]

## 2020-05-20 ENCOUNTER — Ambulatory Visit: Payer: Medicaid Other | Admitting: Family Medicine

## 2020-05-20 MED ORDER — HYDROCODONE-ACETAMINOPHEN 5-325 MG PO TABS: ORAL_TABLET | ORAL | 0 refills | Status: DC

## 2020-05-24 ENCOUNTER — Telehealth: Payer: Self-pay | Admitting: Orthopaedic Surgery

## 2020-05-24 ENCOUNTER — Other Ambulatory Visit: Payer: Self-pay

## 2020-05-24 ENCOUNTER — Ambulatory Visit (INDEPENDENT_AMBULATORY_CARE_PROVIDER_SITE_OTHER): Payer: Medicaid Other | Admitting: Orthopaedic Surgery

## 2020-05-24 ENCOUNTER — Encounter: Payer: Self-pay | Admitting: Orthopaedic Surgery

## 2020-05-24 VITALS — Ht 63.0 in | Wt 141.0 lb

## 2020-05-24 DIAGNOSIS — S82851A Displaced trimalleolar fracture of right lower leg, initial encounter for closed fracture: Secondary | ICD-10-CM

## 2020-05-24 DIAGNOSIS — N186 End stage renal disease: Secondary | ICD-10-CM

## 2020-05-24 DIAGNOSIS — Z794 Long term (current) use of insulin: Secondary | ICD-10-CM

## 2020-05-24 DIAGNOSIS — Z992 Dependence on renal dialysis: Secondary | ICD-10-CM

## 2020-05-24 DIAGNOSIS — E1122 Type 2 diabetes mellitus with diabetic chronic kidney disease: Secondary | ICD-10-CM

## 2020-05-24 MED ORDER — HYDROCODONE-ACETAMINOPHEN 5-325 MG PO TABS: ORAL_TABLET | ORAL | 0 refills | Status: DC

## 2020-05-24 NOTE — Progress Notes (Addendum)
Patient Melanie Hall, female DOB:Apr 01, 1971, 49 y.o. TZG:[id number]  Chief Complaint  Patient presents with  . Fracture    Lt ankle/foot    HPI  Melanie Hall is a 49 y.o. female who has fracture of the left ankle.  I had CT done which showed: IMPRESSION: Displaced trimalleolar fracture as described above. As noted, the talus is laterally displaced up to 1.3 cm off the plafond and posterior displacement large fragment of the posterior malleolus results in a gap at the articular surface which demonstrates early remodeling compatible with pseudoarthrosis formation.  Acute, nondisplaced fractures of the cuboid and talus at the middle facet of the subtalar joint.  Soft tissue swelling and synovitis about the ankle.  I have explained the findings to her.  Her injury was around 05-07-20.  She is on dialysis.  She had delay in being seen.  I got the CT as I was suspicious of more injury than reported on initial X-rays.  I will have her see Dr. Sharol Given in [place].  She is agreeable.  She is on dialysis and will have to work her appointment around this.   Body mass index is 24.98 kg/m.  ROS  Review of Systems  Constitutional: Positive for activity change.  Musculoskeletal: Positive for arthralgias, gait problem and joint swelling.  All other systems reviewed and are negative.   All other systems reviewed and are negative.  The following is a summary of the past history medically, past history surgically, known current medicines, social history and family history.  This information is gathered electronically by the computer from prior information and documentation.  I review this each visit and have found including this information at this point in the chart is beneficial and informative.    Past Medical History:  Diagnosis Date  . Acute cystitis without hematuria   . Acute kidney failure ([place]) 02/28/2019  . Acute midline thoracic back pain 12/29/2019  . Acute on chronic renal  failure ([place]) 02/28/2019  . Acute systolic heart failure ([place]) 10/21/2019  . Anemia   . Cataracts, bilateral    surgery to remove  . CATARACTS, BILATERAL 07/02/2007   Qualifier: Diagnosis of  By: Isla Pence    . Closed fracture of left femur ([place]) 10/28/2018  . Closed fracture of right ankle 11/06/2017  . Diabetes mellitus    type 2  . Emphysematous cystitis 08/26/2018  . Encounter for gynecological examination with Papanicolaou smear of cervix 01/14/2020  . Encounter for screening fecal occult blood testing 01/14/2020  . Encounter for screening for malignant neoplasm of cervix 12/29/2019  . Encounter for screening mammogram for malignant neoplasm of breast 12/29/2019  . ESRD on hemodialysis ([place])   . GERD (gastroesophageal reflux disease)   . Hyperlipidemia   . Hypertension   . Hypokalemia 08/26/2018  . IRREGULAR MENSES 09/14/2009   Qualifier: Diagnosis of  By: Hassell Done FNP, Tori Milks    . Loose stools 11/12/2019  . Normocytic anemia 08/26/2018  . PARONYCHIA, RIGHT GREAT TOE 07/30/2008   Qualifier: Diagnosis of  By: Hassell Done FNP, Tori Milks    . Pressure ulcer 09/10/2019  . Right arm weakness 08/08/2019  . Sprain of left ankle   . STEMI (ST elevation myocardial infarction) ([place]) 10/18/2019  . STEMI involving right coronary artery ([place]) 10/18/2019  . Stroke ([place])   . SVD (spontaneous vaginal delivery)    x 4  . Vaginosis 08/26/2018  . Weakness 09/10/2019  . Weakness of both lower extremities     Past Surgical History:  Procedure Laterality Date  . A/V FISTULAGRAM Left 01/12/2020   Procedure: A/V FISTULAGRAM;  Surgeon: Serafina Mitchell, MD;  Location: [facility];  Service: Cardiovascular;  Laterality: Left;  . A/V FISTULAGRAM N/A 03/17/2020   Procedure: A/V FISTULAGRAM - Left Arm;  Surgeon: Marty Heck, MD;  Location: [facility];  Service: Cardiovascular;  Laterality: N/A;  . AV FISTULA PLACEMENT Left 08/17/2019   Procedure: LEFT BRACHIAL CEPHALIC ARTERIOVENOUS (AV) FISTULA;   Surgeon: Angelia Mould, MD;  Location: [facility];  Service: Vascular;  Laterality: Left;  . CORONARY STENT INTERVENTION N/A 10/18/2019   Procedure: CORONARY STENT INTERVENTION;  Surgeon: Sherren Mocha, MD;  Location: [facility];  Service: Cardiovascular;  Laterality: N/A;  . CORONARY/GRAFT ACUTE MI REVASCULARIZATION N/A 10/18/2019   Procedure: Coronary/Graft Acute MI Revascularization;  Surgeon: Sherren Mocha, MD;  Location: [facility];  Service: Cardiovascular;  Laterality: N/A;  . EYE SURGERY Bilateral    cataracts removed  . FEMUR IM NAIL Left 10/28/2018   Procedure: RETROGRADE FEMORAL NAILING;  Surgeon: Meredith Pel, MD;  Location: [facility];  Service: Orthopedics;  Laterality: Left;  . IM NAILING FEMORAL SHAFT RETROGRADE Left 10/28/2018  . INTRAVASCULAR ULTRASOUND/IVUS N/A 10/18/2019   Procedure: Intravascular Ultrasound/IVUS;  Surgeon: Sherren Mocha, MD;  Location: [facility];  Service: Cardiovascular;  Laterality: N/A;  . IR FLUORO GUIDE CV LINE RIGHT  08/11/2019  . IR THORACENTESIS ASP PLEURAL SPACE W/IMG GUIDE  11/12/2019  . IR US GUIDE VASC ACCESS RIGHT  08/11/2019  . KNEE SURGERY Left   . LEFT HEART CATH AND CORONARY ANGIOGRAPHY N/A 10/18/2019   Procedure: LEFT HEART CATH AND CORONARY ANGIOGRAPHY;  Surgeon: Sherren Mocha, MD;  Location: [facility];  Service: Cardiovascular;  Laterality: N/A;  . PERIPHERAL VASCULAR BALLOON ANGIOPLASTY Left 01/12/2020   Procedure: PERIPHERAL VASCULAR BALLOON ANGIOPLASTY;  Surgeon: Serafina Mitchell, MD;  Location: [facility];  Service: Cardiovascular;  Laterality: Left;  AVF  . PERIPHERAL VASCULAR BALLOON ANGIOPLASTY Left 03/17/2020   Procedure: PERIPHERAL VASCULAR BALLOON ANGIOPLASTY;  Surgeon: Marty Heck, MD;  Location: [facility];  Service: Cardiovascular;  Laterality: Left;  AVF  . PERIPHERAL VASCULAR BALLOON ANGIOPLASTY Left 05/05/2020   Procedure: PERIPHERAL VASCULAR BALLOON ANGIOPLASTY;   Surgeon: Marty Heck, MD;  Location: [facility];  Service: Cardiovascular;  Laterality: Left;  arm fistula  . RADIOLOGY WITH ANESTHESIA N/A 09/15/2019   Procedure: [facility] AND LUMBER LOWER BACK PAIN;  Surgeon: Radiologist, Medication, MD;  Location: [facility];  Service: Radiology;  Laterality: N/A;  . TUBAL LIGATION      Family History  Problem Relation Age of Onset  . Diabetes Mother   . Hypertension Mother   . Bipolar disorder Mother   . Brain cancer Maternal Aunt   . Heart disease Maternal Grandmother   . Diabetes Maternal Grandmother   . Breast cancer Maternal Aunt   . Depression Father        Committed suicide  . Diabetes Sister     Social History Social History   Tobacco Use  . Smoking status: Former Smoker    Packs/day: 0.25    Years: 2.00    Pack years: 0.50    Types: Cigarettes    Quit date: 1997    Years since quitting: 24.8  . Smokeless tobacco: Never Used  Vaping Use  . Vaping Use: Never used  Substance Use Topics  . Alcohol use: No  .  Drug use: No    Allergies  Allergen Reactions  . Contrast Media [Iodinated Diagnostic Agents] Nausea And Vomiting    Treated with Benadryl & Solumedrol  . Penicillins Other (See Comments)    Don't want to take PCN due to family history     Current Outpatient Medications  Medication Sig Dispense Refill  . aspirin 81 MG EC tablet Take 1 tablet (81 mg total) by mouth daily. 90 tablet 3  . atorvastatin (LIPITOR) 80 MG tablet Take 1 tablet (80 mg total) by mouth daily at 6 PM. 90 tablet 3  . carvedilol (COREG) 6.25 MG tablet Take 1.5 tablets (9.375 mg total) by mouth 2 (two) times daily. 180 tablet 3  . epoetin alfa (EPOGEN) 3000 UNIT/ML injection Inject 3,000 Units into the skin every 14 (fourteen) days.     Marland Kitchen gabapentin (NEURONTIN) 300 MG capsule Take 1 capsule (300 mg total) by mouth at bedtime. 90 capsule 0  . hydrALAZINE (APRESOLINE) 10 MG tablet Take 1 tablet (10 mg total) by mouth in the morning and at  bedtime. 180 tablet 3  . HYDROcodone-acetaminophen (NORCO/VICODIN) 5-325 MG tablet One tablet every six hours for pain.  Limit 7 days. 28 tablet 0  . insulin aspart protamine- aspart (NOVOLOG MIX 70/30) (70-30) 100 UNIT/ML injection Inject 0.1 mLs (10 Units total) into the skin 2 (two) times daily with a meal. 10 mL 3  . Insulin Syringes, Disposable, U-100 1 ML MISC Use to inject insulin twice daily dx e11.65 100 each 5  . isosorbide mononitrate (IMDUR) 30 MG 24 hr tablet Take 0.5 tablets (15 mg total) by mouth daily. 15 tablet 11  . levothyroxine (SYNTHROID) 25 MCG tablet Take 1 tablet (25 mcg total) by mouth daily before breakfast. 30 tablet 1  . multivitamin (RENA-VIT) TABS tablet Take 1 tablet by mouth at bedtime. 30 tablet 0  . nitroGLYCERIN (NITROSTAT) 0.4 MG SL tablet Place 1 tablet (0.4 mg total) under the tongue every 5 (five) minutes x 3 doses as needed for chest pain. (Patient taking differently: Place 0.4 mg under the tongue every 5 (five) minutes as needed for chest pain (max 3 doses). ) 25 tablet 3  . ondansetron (ZOFRAN ODT) 4 MG disintegrating tablet 4mg  ODT q4 hours prn nausea/vomit (Patient not taking: Reported on 05/05/2020) 12 tablet 0  . sertraline (ZOLOFT) 25 MG tablet Take 1 tablet (25 mg total) by mouth daily. 30 tablet 0  . tamsulosin (FLOMAX) 0.4 MG CAPS capsule Take 1 capsule (0.4 mg total) by mouth daily after supper. 90 capsule 0  . ticagrelor (BRILINTA) 90 MG TABS tablet Take 1 tablet (90 mg total) by mouth 2 (two) times daily. 180 tablet 3   Current Facility-Administered Medications  Medication Dose Route Frequency Provider Last Rate Last Admin  . 0.9 %  sodium chloride infusion  250 mL Intravenous PRN Marty Heck, MD      . 0.9 %  sodium chloride infusion  250 mL Intravenous PRN Marty Heck, MD      . sodium chloride flush (NS) 0.9 % injection 3 mL  3 mL Intravenous Q12H Marty Heck, MD      . sodium chloride flush (NS) 0.9 % injection 3 mL   3 mL Intravenous PRN Marty Heck, MD         Physical Exam  Height 5\' 3"  (1.6 m), weight 141 lb (64 kg), last menstrual period 12/17/2016.  Constitutional: overall normal hygiene, normal nutrition, well developed, normal grooming, normal  body habitus. Assistive device:CAM walker and Walker.  Musculoskeletal: gait and station Limp left, muscle tone and strength are normal, no tremors or atrophy is present.  .  Neurological: coordination overall normal.  Deep tendon reflex/nerve stretch intact.  Sensation normal.  Cranial nerves II-XII intact.   Skin:   Normal overall no scars, lesions, ulcers or rashes. No psoriasis.  Psychiatric: Alert and oriented x 3.  Recent memory intact, remote memory unclear.  Normal mood and affect. Well groomed.  Good eye contact.  Cardiovascular: overall no swelling, no varicosities, no edema bilaterally, normal temperatures of the legs and arms, no clubbing, cyanosis and good capillary refill.  Lymphatic: palpation is normal.  Left ankle with swelling and pain and decreased ROM.  All other systems reviewed and are negative   The patient has been educated about the nature of the problem(s) and counseled on treatment options.  The patient appeared to understand what I have discussed and is in agreement with it.  Encounter Diagnoses  Name Primary?  . Closed trimalleolar fracture of right ankle, initial encounter Yes  . ESRD (end stage renal disease) on dialysis ([place])   . Type 2 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, with long-term current use of insulin ([place])     PLAN Call if any problems.  Precautions discussed.  Continue current medications.   Return to clinic to Dr. Sharol Given   I have reviewed the [facility] Controlled Substance Reporting System web site prior to prescribing narcotic medicine for this patient.      Electronically Signed Sanjuana Kava, MD 11/16/202110:49 AM

## 2020-05-24 NOTE — Patient Instructions (Signed)
[facility] [address] [place], [place] [address]  Phone: [phone] Fax: [phone]

## 2020-05-24 NOTE — Addendum Note (Signed)
Addended by: Willette Pa on: 05/24/2020 10:51 AM   Modules accepted: Orders

## 2020-05-25 ENCOUNTER — Telehealth: Payer: Self-pay

## 2020-05-25 NOTE — Telephone Encounter (Signed)
[facility] nurse called to ask about cannulate s/p balloon angioplasty of fistula on 05/05/20. Can cannulate per PA. Called back, no answer unable to leave VM.

## 2020-05-26 ENCOUNTER — Encounter (HOSPITAL_COMMUNITY): Payer: Medicaid Other

## 2020-05-26 ENCOUNTER — Ambulatory Visit: Payer: Medicaid Other | Admitting: Family Medicine

## 2020-05-26 ENCOUNTER — Encounter: Payer: Self-pay | Admitting: Family Medicine

## 2020-05-26 ENCOUNTER — Other Ambulatory Visit: Payer: Self-pay

## 2020-05-26 VITALS — BP 122/68 | HR 76 | Temp 98.0°F | Ht 63.0 in | Wt 145.0 lb

## 2020-05-26 DIAGNOSIS — K219 Gastro-esophageal reflux disease without esophagitis: Secondary | ICD-10-CM

## 2020-05-26 DIAGNOSIS — E1122 Type 2 diabetes mellitus with diabetic chronic kidney disease: Secondary | ICD-10-CM | POA: Diagnosis not present

## 2020-05-26 DIAGNOSIS — E039 Hypothyroidism, unspecified: Secondary | ICD-10-CM

## 2020-05-26 DIAGNOSIS — Z992 Dependence on renal dialysis: Secondary | ICD-10-CM

## 2020-05-26 DIAGNOSIS — G43809 Other migraine, not intractable, without status migrainosus: Secondary | ICD-10-CM | POA: Diagnosis not present

## 2020-05-26 DIAGNOSIS — G43909 Migraine, unspecified, not intractable, without status migrainosus: Secondary | ICD-10-CM | POA: Insufficient documentation

## 2020-05-26 DIAGNOSIS — E113593 Type 2 diabetes mellitus with proliferative diabetic retinopathy without macular edema, bilateral: Secondary | ICD-10-CM | POA: Diagnosis not present

## 2020-05-26 DIAGNOSIS — I10 Essential (primary) hypertension: Secondary | ICD-10-CM | POA: Diagnosis not present

## 2020-05-26 DIAGNOSIS — R269 Unspecified abnormalities of gait and mobility: Secondary | ICD-10-CM | POA: Insufficient documentation

## 2020-05-26 DIAGNOSIS — N186 End stage renal disease: Secondary | ICD-10-CM

## 2020-05-26 DIAGNOSIS — R5381 Other malaise: Secondary | ICD-10-CM

## 2020-05-26 DIAGNOSIS — Z794 Long term (current) use of insulin: Secondary | ICD-10-CM | POA: Diagnosis not present

## 2020-05-26 DIAGNOSIS — R29898 Other symptoms and signs involving the musculoskeletal system: Secondary | ICD-10-CM

## 2020-05-26 NOTE — Assessment & Plan Note (Signed)
Melanie Hall is encouraged to check blood sugar daily as directed. Continue current medications. Is on statin as well. Educated on importance of maintain a well balanced diabetic friendly diet.  She is reminded the importance of maintaining  good blood sugars,  taking medications as directed, daily foot care, annual eye exams. Additionally educated about keeping good control over blood pressure and cholesterol as well.

## 2020-05-26 NOTE — Assessment & Plan Note (Signed)
Physical therapy was very beneficial for her.  Additionally to having a fractured left heel unsure of how much they can do but will do physical therapy referral to see if she can get more mobility improvement.

## 2020-05-26 NOTE — Progress Notes (Signed)
Subjective:  Patient ID: Melanie Hall, female    DOB: 1971-01-01  Age: 49 y.o. MRN: [id number]  CC:  Chief Complaint  Patient presents with  . Follow-up      HPI  HPI Melanie Hall is a 49 year old female patient of mine who comes in today for follow-up. Melanie Hall has several chronic conditions and see several specialist for them.  Melanie Hall reports doing well with her hemodialysis.  Melanie Hall is every Monday Wednesday and Friday.  Reports that her most recent labs were good.  Her A1c was 87.8 when they checked it for. Melanie Hall does report some elevated fasting blood sugars but denies have any hypoglycemia.  Denies having any excessive thirst hunger or urination.  Melanie Hall is up-to-date on all of her vaccines.  Her only complaint today is that Melanie Hall would like to get back into physical therapy.  However Melanie Hall does have a fractured ankle unsure physical therapy will work with her until this is healed.  But Melanie Hall reports that is very limiting for her and Melanie Hall was doing very well when Melanie Hall had the therapy prior.  Melanie Hall also reports that Melanie Hall is having ongoing migraines that Melanie Hall pain down her back.  And Melanie Hall is open to referral for neurology.  Melanie Hall denies having any active chest pain, cough, shortness of breath, sinus-like issues, upset stomach, sleep trouble or changes in appetite.  Today patient denies signs and symptoms of COVID 19 infection including fever, chills, cough, shortness of breath, and headache. Past Medical, Surgical, Social History, Allergies, and Medications have been Reviewed.   Past Medical History:  Diagnosis Date  . Acute cystitis without hematuria   . Acute kidney failure ([place]) 02/28/2019  . Acute midline thoracic back pain 12/29/2019  . Acute on chronic renal failure ([place]) 02/28/2019  . Acute systolic heart failure ([place]) 10/21/2019  . Anemia   . Cataracts, bilateral    surgery to remove  . CATARACTS, BILATERAL 07/02/2007   Qualifier: Diagnosis of  By: Isla Pence    . Closed fracture of left femur  ([place]) 10/28/2018  . Closed fracture of right ankle 11/06/2017  . Diabetes mellitus    type 2  . Emphysematous cystitis 08/26/2018  . Encounter for gynecological examination with Papanicolaou smear of cervix 01/14/2020  . Encounter for screening fecal occult blood testing 01/14/2020  . Encounter for screening for malignant neoplasm of cervix 12/29/2019  . Encounter for screening mammogram for malignant neoplasm of breast 12/29/2019  . ESRD on hemodialysis ([place])   . GERD (gastroesophageal reflux disease)   . Hyperlipidemia   . Hypertension   . Hypokalemia 08/26/2018  . IRREGULAR MENSES 09/14/2009   Qualifier: Diagnosis of  By: Hassell Done FNP, Tori Milks    . Loose stools 11/12/2019  . Normocytic anemia 08/26/2018  . PARONYCHIA, RIGHT GREAT TOE 07/30/2008   Qualifier: Diagnosis of  By: Hassell Done FNP, Tori Milks    . Pressure ulcer 09/10/2019  . Right arm weakness 08/08/2019  . Sprain of left ankle   . STEMI (ST elevation myocardial infarction) ([place]) 10/18/2019  . STEMI involving right coronary artery ([place]) 10/18/2019  . Stroke ([place])   . SVD (spontaneous vaginal delivery)    x 4  . Vaginosis 08/26/2018  . Weakness 09/10/2019  . Weakness of both lower extremities     Current Meds  Medication Sig  . aspirin 81 MG EC tablet Take 1 tablet (81 mg total) by mouth daily.  Marland Kitchen atorvastatin (LIPITOR) 80 MG tablet Take 1 tablet (80 mg total)  by mouth daily at 6 PM.  . carvedilol (COREG) 6.25 MG tablet Take 1.5 tablets (9.375 mg total) by mouth 2 (two) times daily.  Marland Kitchen epoetin alfa (EPOGEN) 3000 UNIT/ML injection Inject 3,000 Units into the skin every 14 (fourteen) days.   Marland Kitchen gabapentin (NEURONTIN) 300 MG capsule Take 1 capsule (300 mg total) by mouth at bedtime.  . hydrALAZINE (APRESOLINE) 10 MG tablet Take 1 tablet (10 mg total) by mouth in the morning and at bedtime.  Marland Kitchen HYDROcodone-acetaminophen (NORCO/VICODIN) 5-325 MG tablet One tablet every six hours for pain.  Limit 7 days.  . insulin aspart protamine- aspart (NOVOLOG MIX  70/30) (70-30) 100 UNIT/ML injection Inject 0.1 mLs (10 Units total) into the skin 2 (two) times daily with a meal.  . Insulin Syringes, Disposable, U-100 1 ML MISC Use to inject insulin twice daily dx e11.65  . isosorbide mononitrate (IMDUR) 30 MG 24 hr tablet Take 0.5 tablets (15 mg total) by mouth daily.  Marland Kitchen levothyroxine (SYNTHROID) 25 MCG tablet Take 1 tablet (25 mcg total) by mouth daily before breakfast.  . multivitamin (RENA-VIT) TABS tablet Take 1 tablet by mouth at bedtime.  . nitroGLYCERIN (NITROSTAT) 0.4 MG SL tablet Place 1 tablet (0.4 mg total) under the tongue every 5 (five) minutes x 3 doses as needed for chest pain. (Patient taking differently: Place 0.4 mg under the tongue every 5 (five) minutes as needed for chest pain (max 3 doses). )  . ondansetron (ZOFRAN ODT) 4 MG disintegrating tablet 4mg  ODT q4 hours prn nausea/vomit  . tamsulosin (FLOMAX) 0.4 MG CAPS capsule Take 1 capsule (0.4 mg total) by mouth daily after supper.  . ticagrelor (BRILINTA) 90 MG TABS tablet Take 1 tablet (90 mg total) by mouth 2 (two) times daily.   Current Facility-Administered Medications for the 05/26/20 encounter (Office Visit) with Perlie Mayo, NP  Medication  . 0.9 %  sodium chloride infusion  . 0.9 %  sodium chloride infusion  . sodium chloride flush (NS) 0.9 % injection 3 mL  . sodium chloride flush (NS) 0.9 % injection 3 mL    ROS:  Review of Systems  HENT: Negative.   Eyes: Negative.   Respiratory: Negative.   Cardiovascular: Negative.   Gastrointestinal: Negative.   Genitourinary: Negative.   Musculoskeletal: Positive for joint pain.  Skin: Negative.   Neurological: Positive for headaches.  Endo/Heme/Allergies: Negative.   Psychiatric/Behavioral: Negative.      Objective:   Today's Vitals: BP 122/68 (BP Location: Right Arm, Patient Position: Sitting, Cuff Size: Normal)   Pulse 76   Temp 98 F (36.7 C) (Temporal)   Ht 5\' 3"  (1.6 m)   Wt 145 lb (65.8 kg)   LMP  12/17/2016   SpO2 100%   BMI 25.69 kg/m  Vitals with BMI 05/26/2020 05/24/2020 05/12/2020  Height 5\' 3"  5\' 3"  5\' 3"   Weight 145 lbs 141 lbs 141 lbs  BMI 25.69 24.58 09.98  Systolic 338 - 250  Diastolic 68 - 83  Pulse 76 - 84     Physical Exam Vitals and nursing note reviewed.  Constitutional:      Appearance: Normal appearance. Melanie Hall is well-developed, well-groomed and overweight.  HENT:     Head: Normocephalic and atraumatic.     Right Ear: External ear normal.     Left Ear: External ear normal.     Mouth/Throat:     Comments: Mask in place Eyes:     General:        Right eye:  No discharge.        Left eye: No discharge.     Conjunctiva/sclera: Conjunctivae normal.  Cardiovascular:     Rate and Rhythm: Normal rate and regular rhythm.     Pulses: Normal pulses.     Heart sounds: Normal heart sounds.  Pulmonary:     Effort: Pulmonary effort is normal.     Breath sounds: Normal breath sounds.  Musculoskeletal:        General: Normal range of motion.     Cervical back: Normal range of motion and neck supple.     Comments: In boot secondary to ankle fractures  Rolling seated walker as well  Skin:    General: Skin is warm.  Neurological:     General: No focal deficit present.     Mental Status: Melanie Hall is alert and oriented to person, place, and time.  Psychiatric:        Attention and Perception: Attention normal.        Mood and Affect: Mood normal.        Speech: Speech normal.        Behavior: Behavior normal. Behavior is cooperative.        Thought Content: Thought content normal.        Cognition and Memory: Cognition normal.        Judgment: Judgment normal.      Depression screen [facility] 2/9 05/26/2020 02/18/2020 01/14/2020 12/29/2019  Decreased Interest 0 0 0 0  Down, Depressed, Hopeless 0 0 0 0  PHQ - 2 Score 0 0 0 0  Altered sleeping - - 0 3  Tired, decreased energy - - 0 3  Change in appetite - - 0 3  Feeling bad or failure about yourself  - - 0 0  Trouble  concentrating - - 0 0  Moving slowly or fidgety/restless - - 0 0  Suicidal thoughts - - 0 0  PHQ-9 Score - - 0 9  Difficult doing work/chores - Not difficult at all - Somewhat difficult       Assessment   1. Type 2 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, with long-term current use of insulin ([place])   2. Proliferative diabetic retinopathy of both eyes associated with type 2 diabetes mellitus, unspecified proliferative retinopathy type ([place])   3. ESRD (end stage renal disease) on dialysis ([place])   4. Essential hypertension   5. Gastroesophageal reflux disease, unspecified whether esophagitis present   6. Hypothyroidism, unspecified type   7. Debility   8. Abnormality of gait and mobility   9. Weakness of both lower extremities   10. Other migraine without status migrainosus, not intractable     Tests ordered Orders Placed This Encounter  Procedures  . Ambulatory referral to Physical Therapy  . Ambulatory referral to Neurology     Plan: Please see assessment and plan per problem list above.   No orders of the defined types were placed in this encounter.   Patient to follow-up in Summer of 2022 for CPE   Note: This dictation was prepared with Dragon dictation along with smaller phrase technology. Similar sounding words can be transcribed inadequately or may not be corrected upon review. Any transcriptional errors that result from this process are unintentional.      Perlie Mayo, NP

## 2020-05-26 NOTE — Assessment & Plan Note (Signed)
In a walking boot and has a seated rolling walker as well. Doing well with being as mobile as possible. Referral to PT again to help continue improvement with ambulation.

## 2020-05-26 NOTE — Patient Instructions (Signed)
°  HAPPY FALL!  I appreciate the opportunity to provide you with care for your health and wellness. Today we discussed: overall health   Follow up: Summer of 2022 for CPE -fasting   No labs  Referrals today: Neurology Orders: PT   Have a wonderful Holiday Season!  Please continue to practice social distancing to keep you, your family, and our community safe.  If you must go out, please wear a mask and practice good handwashing.  It was a pleasure to see you and I look forward to continuing to work together on your health and well-being. Please do not hesitate to call the office if you need care or have questions about your care.  Have a wonderful day and week. With Gratitude, Cherly Beach, DNP, AGNP-BC

## 2020-05-26 NOTE — Assessment & Plan Note (Signed)
Referral to neurology for migraine work-up

## 2020-05-26 NOTE — Assessment & Plan Note (Signed)
Controlled, TSH In normal lab in June. Will get updated labs at next appt. No S&S today in office

## 2020-05-26 NOTE — Assessment & Plan Note (Signed)
Melanie Hall is encouraged to maintain a well balanced diet that is low in salt. Controlled, continue current medication regimen.  Additionally, she is also reminded that exercise is beneficial for heart health and control of  Blood pressure. 30-60 minutes daily is recommended-walking was suggested.

## 2020-05-26 NOTE — Assessment & Plan Note (Signed)
Getting treatment for this. Has upcoming appt Tuesday next week.

## 2020-05-26 NOTE — Assessment & Plan Note (Signed)
Is followed by nephrology, HD MWF Overall doing well with this.

## 2020-05-31 ENCOUNTER — Encounter (HOSPITAL_COMMUNITY): Payer: Self-pay | Admitting: Orthopedic Surgery

## 2020-05-31 ENCOUNTER — Encounter: Payer: Self-pay | Admitting: Orthopedic Surgery

## 2020-05-31 ENCOUNTER — Ambulatory Visit: Payer: Medicaid Other | Admitting: Orthopedic Surgery

## 2020-05-31 ENCOUNTER — Ambulatory Visit: Payer: Self-pay

## 2020-05-31 ENCOUNTER — Other Ambulatory Visit: Payer: Self-pay | Admitting: Physician Assistant

## 2020-05-31 ENCOUNTER — Other Ambulatory Visit: Payer: Self-pay

## 2020-05-31 DIAGNOSIS — E113512 Type 2 diabetes mellitus with proliferative diabetic retinopathy with macular edema, left eye: Secondary | ICD-10-CM | POA: Diagnosis not present

## 2020-05-31 DIAGNOSIS — S82872A Displaced pilon fracture of left tibia, initial encounter for closed fracture: Secondary | ICD-10-CM

## 2020-05-31 DIAGNOSIS — M25571 Pain in right ankle and joints of right foot: Secondary | ICD-10-CM

## 2020-05-31 DIAGNOSIS — S82852A Displaced trimalleolar fracture of left lower leg, initial encounter for closed fracture: Secondary | ICD-10-CM

## 2020-05-31 MED ORDER — OXYCODONE-ACETAMINOPHEN 5-325 MG PO TABS
1.0000 | ORAL_TABLET | ORAL | 0 refills | Status: DC | PRN
Start: 2020-05-31 — End: 2020-06-07

## 2020-05-31 NOTE — Progress Notes (Unsigned)
Office Visit Note   Patient: Melanie Hall           Date of Birth: [birth year]           MRN: [id number] Visit Date: 05/31/2020              Requested by: No referring provider defined for this encounter. PCP: Perlie Mayo, NP  No chief complaint on file.     HPI: ***  Assessment & Plan: Visit Diagnoses: No diagnosis found.  Plan: ***  Follow-Up Instructions: No follow-ups on file.   Ortho Exam  Patient is alert, oriented, no adenopathy, well-dressed, normal affect, normal respiratory effort. ***  Imaging: No results found. No images are attached to the encounter.  Labs: Lab Results  Component Value Date   HGBA1C 7.6 (H) 10/18/2019   HGBA1C 7.2 (H) 09/10/2019   HGBA1C 8.1 (H) 05/27/2019   ESRSEDRATE 45 (H) 09/10/2019   CRP 5.0 (H) 09/10/2019   LABURIC 5.7 05/27/2019   REPTSTATUS 11/13/2019 FINAL 11/12/2019   REPTSTATUS 11/17/2019 FINAL 11/12/2019   GRAMSTAIN  11/12/2019    WBC PRESENT,BOTH PMN AND MONONUCLEAR NO ORGANISMS SEEN CYTOSPIN SMEAR Performed at [facility], [place] [address], [place], [place] [address]    CULT  11/12/2019    NO GROWTH 5 DAYS Performed at [facility] [address], [place], [place] [address]    LABORGA ESCHERICHIA COLI (A) 08/26/2018     Lab Results  Component Value Date   ALBUMIN 3.9 12/15/2019   ALBUMIN 2.5 (L) 10/19/2019   ALBUMIN 2.7 (L) 10/18/2019   LABURIC 5.7 05/27/2019    Lab Results  Component Value Date   MG 1.8 08/24/2019   MG 1.7 08/23/2019   MG 1.7 08/09/2019   Lab Results  Component Value Date   VD25OH 8.82 (L) 05/27/2019    No results found for: PREALBUMIN CBC EXTENDED Latest Ref Rng & Units 05/05/2020 05/05/2020 03/17/2020  WBC 4.0 - 10.5 K/uL - - -  RBC 3.87 - 5.11 MIL/uL - - -  HGB 12.0 - 15.0 g/dL 8.5(L) 9.9(L) 12.2  HCT 36 - 46 % 25.0(L) 29.0(L) 36.0  PLT 150 - 400 K/uL - - -  NEUTROABS 1.7 - 7.7 K/uL - - -  LYMPHSABS 0.7 - 4.0 K/uL - - -     There is no height or  weight on file to calculate BMI.  Orders:  No orders of the defined types were placed in this encounter.  No orders of the defined types were placed in this encounter.    Procedures: No procedures performed  Clinical Data: No additional findings.  ROS:  All other systems negative, except as noted in the HPI. Review of Systems  Objective: Vital Signs: LMP 12/17/2016   Specialty Comments:  No specialty comments available.  PMFS History: Patient Active Problem List   Diagnosis Date Noted  . Abnormality of gait and mobility 05/26/2020  . Migraine 05/26/2020  . Diabetic retinopathy ([place]) 05/04/2020  . Chronic diarrhea of unknown origin 01/14/2020  . Type 2 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, with long-term current use of insulin ([place]) 12/29/2019  . Hypothyroidism 12/29/2019  . Weakness of both lower extremities 11/12/2019  . Debility 09/10/2019  . ESRD (end stage renal disease) on dialysis ([place]) 09/10/2019  . Depression 09/10/2019  . Incontinence in female 09/10/2019  . Anemia in chronic kidney disease 08/19/2019  . MGUS (monoclonal gammopathy of unknown significance) 07/23/2019  . GERD (  gastroesophageal reflux disease) 02/28/2019  . Essential hypertension 10/28/2018  . Irregular menses 09/14/2009  . Hypercholesterolemia 11/19/2008  . Type 2 diabetes with nephropathy ([place]) 07/02/2007   Past Medical History:  Diagnosis Date  . Acute cystitis without hematuria   . Acute kidney failure ([place]) 02/28/2019  . Acute midline thoracic back pain 12/29/2019  . Acute on chronic renal failure ([place]) 02/28/2019  . Acute systolic heart failure ([place]) 10/21/2019  . Anemia   . Cataracts, bilateral    surgery to remove  . CATARACTS, BILATERAL 07/02/2007   Qualifier: Diagnosis of  By: Isla Pence    . Closed fracture of left femur ([place]) 10/28/2018  . Closed fracture of right ankle 11/06/2017  . Diabetes mellitus    type 2  . Emphysematous cystitis  08/26/2018  . Encounter for gynecological examination with Papanicolaou smear of cervix 01/14/2020  . Encounter for screening fecal occult blood testing 01/14/2020  . Encounter for screening for malignant neoplasm of cervix 12/29/2019  . Encounter for screening mammogram for malignant neoplasm of breast 12/29/2019  . ESRD on hemodialysis ([place])   . GERD (gastroesophageal reflux disease)   . Hyperlipidemia   . Hypertension   . Hypokalemia 08/26/2018  . IRREGULAR MENSES 09/14/2009   Qualifier: Diagnosis of  By: Hassell Done FNP, Tori Milks    . Loose stools 11/12/2019  . Normocytic anemia 08/26/2018  . PARONYCHIA, RIGHT GREAT TOE 07/30/2008   Qualifier: Diagnosis of  By: Hassell Done FNP, Tori Milks    . Pressure ulcer 09/10/2019  . Right arm weakness 08/08/2019  . Sprain of left ankle   . STEMI (ST elevation myocardial infarction) ([place]) 10/18/2019  . STEMI involving right coronary artery ([place]) 10/18/2019  . Stroke ([place])   . SVD (spontaneous vaginal delivery)    x 4  . Vaginosis 08/26/2018  . Weakness 09/10/2019  . Weakness of both lower extremities     Family History  Problem Relation Age of Onset  . Diabetes Mother   . Hypertension Mother   . Bipolar disorder Mother   . Brain cancer Maternal Aunt   . Heart disease Maternal Grandmother   . Diabetes Maternal Grandmother   . Breast cancer Maternal Aunt   . Depression Father        Committed suicide  . Diabetes Sister     Past Surgical History:  Procedure Laterality Date  . A/V FISTULAGRAM Left 01/12/2020   Procedure: A/V FISTULAGRAM;  Surgeon: Serafina Mitchell, MD;  Location: [facility];  Service: Cardiovascular;  Laterality: Left;  . A/V FISTULAGRAM N/A 03/17/2020   Procedure: A/V FISTULAGRAM - Left Arm;  Surgeon: Marty Heck, MD;  Location: [facility];  Service: Cardiovascular;  Laterality: N/A;  . AV FISTULA PLACEMENT Left 08/17/2019   Procedure: LEFT BRACHIAL CEPHALIC ARTERIOVENOUS (AV) FISTULA;  Surgeon: Angelia Mould, MD;   Location: [facility];  Service: Vascular;  Laterality: Left;  . CORONARY STENT INTERVENTION N/A 10/18/2019   Procedure: CORONARY STENT INTERVENTION;  Surgeon: Sherren Mocha, MD;  Location: [facility];  Service: Cardiovascular;  Laterality: N/A;  . CORONARY/GRAFT ACUTE MI REVASCULARIZATION N/A 10/18/2019   Procedure: Coronary/Graft Acute MI Revascularization;  Surgeon: Sherren Mocha, MD;  Location: [facility];  Service: Cardiovascular;  Laterality: N/A;  . EYE SURGERY Bilateral    cataracts removed  . FEMUR IM NAIL Left 10/28/2018   Procedure: RETROGRADE FEMORAL NAILING;  Surgeon: Meredith Pel, MD;  Location: [facility];  Service: Orthopedics;  Laterality: Left;  .  IM NAILING FEMORAL SHAFT RETROGRADE Left 10/28/2018  . INTRAVASCULAR ULTRASOUND/IVUS N/A 10/18/2019   Procedure: Intravascular Ultrasound/IVUS;  Surgeon: Sherren Mocha, MD;  Location: [facility];  Service: Cardiovascular;  Laterality: N/A;  . IR FLUORO GUIDE CV LINE RIGHT  08/11/2019  . IR THORACENTESIS ASP PLEURAL SPACE W/IMG GUIDE  11/12/2019  . IR US GUIDE VASC ACCESS RIGHT  08/11/2019  . KNEE SURGERY Left   . LEFT HEART CATH AND CORONARY ANGIOGRAPHY N/A 10/18/2019   Procedure: LEFT HEART CATH AND CORONARY ANGIOGRAPHY;  Surgeon: Sherren Mocha, MD;  Location: [facility];  Service: Cardiovascular;  Laterality: N/A;  . PERIPHERAL VASCULAR BALLOON ANGIOPLASTY Left 01/12/2020   Procedure: PERIPHERAL VASCULAR BALLOON ANGIOPLASTY;  Surgeon: Serafina Mitchell, MD;  Location: [facility];  Service: Cardiovascular;  Laterality: Left;  AVF  . PERIPHERAL VASCULAR BALLOON ANGIOPLASTY Left 03/17/2020   Procedure: PERIPHERAL VASCULAR BALLOON ANGIOPLASTY;  Surgeon: [place] Heck, MD;  Location: [facility];  Service: Cardiovascular;  Laterality: Left;  AVF  . PERIPHERAL VASCULAR BALLOON ANGIOPLASTY Left 05/05/2020   Procedure: PERIPHERAL VASCULAR BALLOON ANGIOPLASTY;  Surgeon: Marty Heck, MD;   Location: [facility];  Service: Cardiovascular;  Laterality: Left;  arm fistula  . RADIOLOGY WITH ANESTHESIA N/A 09/15/2019   Procedure: [facility] AND LUMBER LOWER BACK PAIN;  Surgeon: Radiologist, Medication, MD;  Location: [facility];  Service: Radiology;  Laterality: N/A;  . TUBAL LIGATION     Social History   Occupational History  . Not on file  Tobacco Use  . Smoking status: Former Smoker    Packs/day: 0.25    Years: 2.00    Pack years: 0.50    Types: Cigarettes    Quit date: 1997    Years since quitting: 24.9  . Smokeless tobacco: Never Used  Vaping Use  . Vaping Use: Never used  Substance and Sexual Activity  . Alcohol use: No  . Drug use: No  . Sexual activity: Yes    Birth control/protection: Surgical    Comment: tubal

## 2020-05-31 NOTE — Progress Notes (Addendum)
Melanie Hall denies chest pain or shortness of breath. Patient denies any s/s of Covid and has not been exposed to anyone with symptoms.  Patient will be tested on arrival for Covid.  Melanie Hall has type II DM, patient reports that CBG's run 120's- 130's, last Hemoglobin A1C was 9.7 - drawn on dialysis. Patient has not eaten dinner , I instructed patient to take 7 units of 70/30 Insulin with dinner and no Insulin in am. I instructed patient to check CBG after awaking and every 2 hours until arrival  to the hospital.  I Instructed patient if CBG is less than 70 to drink 1/2 cup of a clear juice. Recheck CBG in 15 minutes then call pre- op desk at [phone] for further instructions.   Melanie Hall had cardiac cath with stents 10/2019; she is on Aspirin and Brilenta.  Melanie Hall said sh ewas not taking any medications in am. I encouraged her to take ASA, I read patient the note from Dr Burt Knack - he said patient to stay on ASA and could hold Brilentia- if procedure was after 04/18/20 -  for 5 days prior to an Endo procedure; procedure was put on hold.  Melanie Hall could not verify medications, because her daughters prepare medication boxes.  I instructed patient on medications she should take in am- I asked patient to take the medication out of prescription bottles, not medication box.

## 2020-05-31 NOTE — Progress Notes (Signed)
Office Visit Note   Patient: Melanie Hall           Date of Birth: 08-20-1970           MRN: [id number] Visit Date: 05/31/2020              Requested by: Sanjuana Kava, [place],  [place] [address] PCP: Perlie [facility], NP  Chief Complaint  Patient presents with  . Right Ankle - Pain    DOI 05/07/20 twist injury       HPI: Patient is a 49 year old woman with diabetic insensate neuropathy end-stage renal disease on dialysis Monday Wednesday Friday who fell sustaining a traumatic injury to her left ankle on 05/07/2020.  Patient was evaluated by Dr. Luna Glasgow and underwent a CT scan to further identify the morphology of the fracture on 05/17/2020 patient is seen today in consultation for initial evaluation.  Assessment & Plan: Visit Diagnoses:  1. Pain in right ankle and joints of right foot   2. Trimalleolar fracture of ankle, closed, left, initial encounter   3. Closed pilon fracture, left, initial encounter     Plan: Patient does not have reconstruction options for the tibiotalar joint.  We will need to proceed with a tibiotalar fusion.  Risks and benefits were discussed including risk of the skin not healing risk of the bone not healing neurovascular injury.  There is a potential for amputation.  Patient states she wishes to proceed with surgery as soon as possible.  We will plan for surgery tomorrow.  I am concerned with the displacement of the tibia with pressure over the medial skin envelope that patient may develop ischemic skin changes and breakdown.  Follow-Up Instructions: Return in about 1 week (around 06/07/2020).   Ortho Exam  Patient is alert, oriented, no adenopathy, well-dressed, normal affect, normal respiratory effort. Examination patient has brawny edema with venous insufficiency to the left leg she has deformity of the ankle there is no open skin ulcers no open fracture.  She has a palpable dorsalis pedis and posterior tibial pulse the  Doppler was used and she has a biphasic dorsalis pedis and posterior tibial pulse.  Review of the radiographs and CT scan shows a large posterior malleolar fracture that is displaced and a large impacted pilon fracture of the tibia involving almost the entire joint line a displaced medial malleolar fracture and a displaced fibular fracture.  The ankle is subluxed with the tibia laterally and anteriorly.  Imaging: No results found. No images are attached to the encounter.  Labs: Lab Results  Component Value Date   HGBA1C 7.6 (H) 10/18/2019   HGBA1C 7.2 (H) 09/10/2019   HGBA1C 8.1 (H) 05/27/2019   ESRSEDRATE 45 (H) 09/10/2019   CRP 5.0 (H) 09/10/2019   LABURIC 5.7 05/27/2019   REPTSTATUS 11/13/2019 FINAL 11/12/2019   REPTSTATUS 11/17/2019 FINAL 11/12/2019   GRAMSTAIN  11/12/2019    WBC PRESENT,BOTH PMN AND MONONUCLEAR NO ORGANISMS SEEN CYTOSPIN SMEAR Performed at [facility], [place] [address] [place], [place] [address]    CULT  11/12/2019    NO GROWTH 5 DAYS Performed at [facility] [address] [place], [place] [address]    LABORGA ESCHERICHIA COLI (A) 08/26/2018     Lab Results  Component Value Date   ALBUMIN 3.9 12/15/2019   ALBUMIN 2.5 (L) 10/19/2019   ALBUMIN 2.7 (L) 10/18/2019   LABURIC 5.7 05/27/2019    Lab Results  Component Value Date  MG 1.8 08/24/2019   MG 1.7 08/23/2019   MG 1.7 08/09/2019   Lab Results  Component Value Date   VD25OH 8.82 (L) 05/27/2019    No results found for: PREALBUMIN CBC EXTENDED Latest Ref Rng & Units 05/05/2020 05/05/2020 03/17/2020  WBC 4.0 - 10.5 K/uL - - -  RBC 3.87 - 5.11 MIL/uL - - -  HGB 12.0 - 15.0 g/dL 8.5(L) 9.9(L) 12.2  HCT 36 - 46 % 25.0(L) 29.0(L) 36.0  PLT 150 - 400 K/uL - - -  NEUTROABS 1.7 - 7.7 K/uL - - -  LYMPHSABS 0.7 - 4.0 K/uL - - -     There is no height or weight on file to calculate BMI.  Orders:  Orders Placed This Encounter  Procedures  . XR Ankle Complete Right   No  orders of the defined types were placed in this encounter.    Procedures: No procedures performed  Clinical Data: No additional findings.  ROS:  All other systems negative, except as noted in the HPI. Review of Systems  Objective: Vital Signs: LMP 12/17/2016   Specialty Comments:  No specialty comments available.  PMFS History: Patient Active Problem List   Diagnosis Date Noted  . Abnormality of gait and mobility 05/26/2020  . Migraine 05/26/2020  . Diabetic retinopathy ([place]) 05/04/2020  . Chronic diarrhea of unknown origin 01/14/2020  . Type 2 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, with long-term current use of insulin ([place]) 12/29/2019  . Hypothyroidism 12/29/2019  . Weakness of both lower extremities 11/12/2019  . Debility 09/10/2019  . ESRD (end stage renal disease) on dialysis ([place]) 09/10/2019  . Depression 09/10/2019  . Incontinence in female 09/10/2019  . Anemia in chronic kidney disease 08/19/2019  . MGUS (monoclonal gammopathy of unknown significance) 07/23/2019  . GERD (gastroesophageal reflux disease) 02/28/2019  . Essential hypertension 10/28/2018  . Irregular menses 09/14/2009  . Hypercholesterolemia 11/19/2008  . Type 2 diabetes with nephropathy ([place]) 07/02/2007   Past Medical History:  Diagnosis Date  . Acute cystitis without hematuria   . Acute kidney failure ([place]) 02/28/2019  . Acute midline thoracic back pain 12/29/2019  . Acute on chronic renal failure ([place]) 02/28/2019  . Acute systolic heart failure ([place]) 10/21/2019  . Anemia   . Cataracts, bilateral    surgery to remove  . CATARACTS, BILATERAL 07/02/2007   Qualifier: Diagnosis of  By: Isla Pence    . Closed fracture of left femur ([place]) 10/28/2018  . Closed fracture of right ankle 11/06/2017  . Diabetes mellitus    type 2  . Emphysematous cystitis 08/26/2018  . Encounter for gynecological examination with Papanicolaou smear of cervix 01/14/2020  . Encounter for  screening fecal occult blood testing 01/14/2020  . Encounter for screening for malignant neoplasm of cervix 12/29/2019  . Encounter for screening mammogram for malignant neoplasm of breast 12/29/2019  . ESRD on hemodialysis ([place])   . GERD (gastroesophageal reflux disease)   . Hyperlipidemia   . Hypertension   . Hypokalemia 08/26/2018  . IRREGULAR MENSES 09/14/2009   Qualifier: Diagnosis of  By: Hassell Done FNP, Tori Milks    . Loose stools 11/12/2019  . Normocytic anemia 08/26/2018  . PARONYCHIA, RIGHT GREAT TOE 07/30/2008   Qualifier: Diagnosis of  By: Hassell Done FNP, Tori Milks    . Pressure ulcer 09/10/2019  . Right arm weakness 08/08/2019  . Sprain of left ankle   . STEMI (ST elevation myocardial infarction) ([place]) 10/18/2019  . STEMI involving right coronary artery ([place])  10/18/2019  . Stroke ([place])   . SVD (spontaneous vaginal delivery)    x 4  . Vaginosis 08/26/2018  . Weakness 09/10/2019  . Weakness of both lower extremities     Family History  Problem Relation Age of Onset  . Diabetes Mother   . Hypertension Mother   . Bipolar disorder Mother   . Brain cancer Maternal Aunt   . Heart disease Maternal Grandmother   . Diabetes Maternal Grandmother   . Breast cancer Maternal Aunt   . Depression Father        Committed suicide  . Diabetes Sister     Past Surgical History:  Procedure Laterality Date  . A/V FISTULAGRAM Left 01/12/2020   Procedure: A/V FISTULAGRAM;  Surgeon: Serafina Mitchell, MD;  Location: [facility];  Service: Cardiovascular;  Laterality: Left;  . A/V FISTULAGRAM N/A 03/17/2020   Procedure: A/V FISTULAGRAM - Left Arm;  Surgeon: Marty Heck, MD;  Location: [facility];  Service: Cardiovascular;  Laterality: N/A;  . AV FISTULA PLACEMENT Left 08/17/2019   Procedure: LEFT BRACHIAL CEPHALIC ARTERIOVENOUS (AV) FISTULA;  Surgeon: Angelia Mould, MD;  Location: [facility];  Service: Vascular;  Laterality: Left;  . CORONARY STENT INTERVENTION N/A 10/18/2019   Procedure:  CORONARY STENT INTERVENTION;  Surgeon: Sherren Mocha, MD;  Location: [facility];  Service: Cardiovascular;  Laterality: N/A;  . CORONARY/GRAFT ACUTE MI REVASCULARIZATION N/A 10/18/2019   Procedure: Coronary/Graft Acute MI Revascularization;  Surgeon: Sherren Mocha, MD;  Location: [facility];  Service: Cardiovascular;  Laterality: N/A;  . EYE SURGERY Bilateral    cataracts removed  . FEMUR IM NAIL Left 10/28/2018   Procedure: RETROGRADE FEMORAL NAILING;  Surgeon: Meredith Pel, MD;  Location: [facility];  Service: Orthopedics;  Laterality: Left;  . IM NAILING FEMORAL SHAFT RETROGRADE Left 10/28/2018  . INTRAVASCULAR ULTRASOUND/IVUS N/A 10/18/2019   Procedure: Intravascular Ultrasound/IVUS;  Surgeon: Sherren Mocha, MD;  Location: [facility];  Service: Cardiovascular;  Laterality: N/A;  . IR FLUORO GUIDE CV LINE RIGHT  08/11/2019  . IR THORACENTESIS ASP PLEURAL SPACE W/IMG GUIDE  11/12/2019  . IR US GUIDE VASC ACCESS RIGHT  08/11/2019  . KNEE SURGERY Left   . LEFT HEART CATH AND CORONARY ANGIOGRAPHY N/A 10/18/2019   Procedure: LEFT HEART CATH AND CORONARY ANGIOGRAPHY;  Surgeon: Sherren Mocha, MD;  Location: [facility];  Service: Cardiovascular;  Laterality: N/A;  . PERIPHERAL VASCULAR BALLOON ANGIOPLASTY Left 01/12/2020   Procedure: PERIPHERAL VASCULAR BALLOON ANGIOPLASTY;  Surgeon: Serafina Mitchell, MD;  Location: [facility];  Service: Cardiovascular;  Laterality: Left;  AVF  . PERIPHERAL VASCULAR BALLOON ANGIOPLASTY Left 03/17/2020   Procedure: PERIPHERAL VASCULAR BALLOON ANGIOPLASTY;  Surgeon: [place] Heck, MD;  Location: [facility];  Service: Cardiovascular;  Laterality: Left;  AVF  . PERIPHERAL VASCULAR BALLOON ANGIOPLASTY Left 05/05/2020   Procedure: PERIPHERAL VASCULAR BALLOON ANGIOPLASTY;  Surgeon: [place] Heck, MD;  Location: [facility];  Service: Cardiovascular;  Laterality: Left;  arm fistula  . RADIOLOGY WITH ANESTHESIA  N/A 09/15/2019   Procedure: [facility] AND LUMBER LOWER BACK PAIN;  Surgeon: Radiologist, Medication, MD;  Location: [facility];  Service: Radiology;  Laterality: N/A;  . TUBAL LIGATION     Social History   Occupational History  . Not on file  Tobacco Use  . Smoking status: Former Smoker    Packs/day: 0.25    Years: 2.00    Pack  years: 0.50    Types: Cigarettes    Quit date: 70    Years since quitting: 24.9  . Smokeless tobacco: Never Used  Vaping Use  . Vaping Use: Never used  Substance and Sexual Activity  . Alcohol use: No  . Drug use: No  . Sexual activity: Yes    Birth control/protection: Surgical    Comment: tubal

## 2020-06-01 ENCOUNTER — Encounter (HOSPITAL_COMMUNITY)
Admission: RE | Disposition: A | Discharge status: Home / Self Care | Payer: Self-pay | Source: Home / Self Care | Attending: Orthopedic Surgery

## 2020-06-01 ENCOUNTER — Inpatient Hospital Stay (HOSPITAL_COMMUNITY): Payer: Medicaid Other | Admitting: Anesthesiology

## 2020-06-01 ENCOUNTER — Inpatient Hospital Stay (HOSPITAL_COMMUNITY)
Admission: RE | Admit: 2020-06-01 | Discharge: 2020-06-07 | DRG: 492 | Disposition: A | Discharge status: Home / Self Care | Payer: Medicaid Other | Attending: Orthopedic Surgery | Admitting: Orthopedic Surgery

## 2020-06-01 ENCOUNTER — Other Ambulatory Visit: Payer: Self-pay

## 2020-06-01 ENCOUNTER — Encounter (HOSPITAL_COMMUNITY): Payer: Self-pay | Admitting: Orthopedic Surgery

## 2020-06-01 DIAGNOSIS — Z955 Presence of coronary angioplasty implant and graft: Secondary | ICD-10-CM

## 2020-06-01 DIAGNOSIS — E1140 Type 2 diabetes mellitus with diabetic neuropathy, unspecified: Secondary | ICD-10-CM | POA: Diagnosis present

## 2020-06-01 DIAGNOSIS — N3080 Other cystitis without hematuria: Secondary | ICD-10-CM | POA: Diagnosis present

## 2020-06-01 DIAGNOSIS — M1990 Unspecified osteoarthritis, unspecified site: Secondary | ICD-10-CM | POA: Diagnosis not present

## 2020-06-01 DIAGNOSIS — I2510 Atherosclerotic heart disease of native coronary artery without angina pectoris: Secondary | ICD-10-CM | POA: Diagnosis present

## 2020-06-01 DIAGNOSIS — Z992 Dependence on renal dialysis: Secondary | ICD-10-CM

## 2020-06-01 DIAGNOSIS — Z981 Arthrodesis status: Secondary | ICD-10-CM

## 2020-06-01 DIAGNOSIS — S82852A Displaced trimalleolar fracture of left lower leg, initial encounter for closed fracture: Secondary | ICD-10-CM | POA: Diagnosis not present

## 2020-06-01 DIAGNOSIS — N186 End stage renal disease: Secondary | ICD-10-CM | POA: Diagnosis not present

## 2020-06-01 DIAGNOSIS — D631 Anemia in chronic kidney disease: Secondary | ICD-10-CM | POA: Diagnosis present

## 2020-06-01 DIAGNOSIS — W19XXXA Unspecified fall, initial encounter: Secondary | ICD-10-CM | POA: Diagnosis present

## 2020-06-01 DIAGNOSIS — M898X9 Other specified disorders of bone, unspecified site: Secondary | ICD-10-CM | POA: Diagnosis not present

## 2020-06-01 DIAGNOSIS — E1129 Type 2 diabetes mellitus with other diabetic kidney complication: Secondary | ICD-10-CM | POA: Diagnosis not present

## 2020-06-01 DIAGNOSIS — G43909 Migraine, unspecified, not intractable, without status migrainosus: Secondary | ICD-10-CM | POA: Diagnosis not present

## 2020-06-01 DIAGNOSIS — Z20822 Contact with and (suspected) exposure to covid-19: Secondary | ICD-10-CM | POA: Diagnosis not present

## 2020-06-01 DIAGNOSIS — I132 Hypertensive heart and chronic kidney disease with heart failure and with stage 5 chronic kidney disease, or end stage renal disease: Secondary | ICD-10-CM | POA: Diagnosis present

## 2020-06-01 DIAGNOSIS — Z8673 Personal history of transient ischemic attack (TIA), and cerebral infarction without residual deficits: Secondary | ICD-10-CM

## 2020-06-01 DIAGNOSIS — E785 Hyperlipidemia, unspecified: Secondary | ICD-10-CM | POA: Diagnosis present

## 2020-06-01 DIAGNOSIS — I5022 Chronic systolic (congestive) heart failure: Secondary | ICD-10-CM | POA: Diagnosis present

## 2020-06-01 DIAGNOSIS — E1122 Type 2 diabetes mellitus with diabetic chronic kidney disease: Secondary | ICD-10-CM | POA: Diagnosis not present

## 2020-06-01 DIAGNOSIS — N250 Renal osteodystrophy: Secondary | ICD-10-CM | POA: Diagnosis not present

## 2020-06-01 DIAGNOSIS — Z803 Family history of malignant neoplasm of breast: Secondary | ICD-10-CM

## 2020-06-01 DIAGNOSIS — Z8249 Family history of ischemic heart disease and other diseases of the circulatory system: Secondary | ICD-10-CM

## 2020-06-01 DIAGNOSIS — Z808 Family history of malignant neoplasm of other organs or systems: Secondary | ICD-10-CM

## 2020-06-01 DIAGNOSIS — F32A Depression, unspecified: Secondary | ICD-10-CM

## 2020-06-01 DIAGNOSIS — Z818 Family history of other mental and behavioral disorders: Secondary | ICD-10-CM

## 2020-06-01 DIAGNOSIS — E039 Hypothyroidism, unspecified: Secondary | ICD-10-CM | POA: Diagnosis not present

## 2020-06-01 DIAGNOSIS — E7800 Pure hypercholesterolemia, unspecified: Secondary | ICD-10-CM | POA: Diagnosis not present

## 2020-06-01 DIAGNOSIS — Z87891 Personal history of nicotine dependence: Secondary | ICD-10-CM

## 2020-06-01 DIAGNOSIS — Z833 Family history of diabetes mellitus: Secondary | ICD-10-CM

## 2020-06-01 DIAGNOSIS — S82872A Displaced pilon fracture of left tibia, initial encounter for closed fracture: Secondary | ICD-10-CM | POA: Diagnosis not present

## 2020-06-01 DIAGNOSIS — M25571 Pain in right ankle and joints of right foot: Secondary | ICD-10-CM

## 2020-06-01 DIAGNOSIS — I119 Hypertensive heart disease without heart failure: Secondary | ICD-10-CM | POA: Diagnosis not present

## 2020-06-01 DIAGNOSIS — Z880 Allergy status to penicillin: Secondary | ICD-10-CM

## 2020-06-01 DIAGNOSIS — K219 Gastro-esophageal reflux disease without esophagitis: Secondary | ICD-10-CM | POA: Diagnosis present

## 2020-06-01 DIAGNOSIS — Z91041 Radiographic dye allergy status: Secondary | ICD-10-CM

## 2020-06-01 DIAGNOSIS — I252 Old myocardial infarction: Secondary | ICD-10-CM

## 2020-06-01 DIAGNOSIS — E11649 Type 2 diabetes mellitus with hypoglycemia without coma: Secondary | ICD-10-CM | POA: Diagnosis not present

## 2020-06-01 HISTORY — DX: Unspecified osteoarthritis, unspecified site: M19.90

## 2020-06-01 HISTORY — PX: ANKLE FUSION: SHX5718

## 2020-06-01 LAB — POCT I-STAT, CHEM 8
BUN: 56 mg/dL — ABNORMAL HIGH (ref 6–20)
Calcium, Ion: 1.01 mmol/L — ABNORMAL LOW (ref 1.15–1.40)
Chloride: 99 mmol/L (ref 98–111)
Creatinine, Ser: 6 mg/dL — ABNORMAL HIGH (ref 0.44–1.00)
Glucose, Bld: 290 mg/dL — ABNORMAL HIGH (ref 70–99)
HCT: 29 % — ABNORMAL LOW (ref 36.0–46.0)
Hemoglobin: 9.9 g/dL — ABNORMAL LOW (ref 12.0–15.0)
Potassium: 6.1 mmol/L — ABNORMAL HIGH (ref 3.5–5.1)
Sodium: 132 mmol/L — ABNORMAL LOW (ref 135–145)
TCO2: 23 mmol/L (ref 22–32)

## 2020-06-01 LAB — GLUCOSE, CAPILLARY
Glucose-Capillary: 145 mg/dL — ABNORMAL HIGH (ref 70–99)
Glucose-Capillary: 243 mg/dL — ABNORMAL HIGH (ref 70–99)
Glucose-Capillary: 287 mg/dL — ABNORMAL HIGH (ref 70–99)

## 2020-06-01 LAB — SARS CORONAVIRUS 2 BY RT PCR (HOSPITAL ORDER, PERFORMED IN ~~LOC~~ HOSPITAL LAB): SARS Coronavirus 2: NEGATIVE

## 2020-06-01 SURGERY — ANKLE FUSION
Anesthesia: Monitor Anesthesia Care | Site: Ankle | Laterality: Left

## 2020-06-01 MED ORDER — TAMSULOSIN HCL 0.4 MG PO CAPS
0.4000 mg | ORAL_CAPSULE | Freq: Every day | ORAL | Status: DC
  Administered 2020-06-02 – 2020-06-06 (×5): 0.4 mg via ORAL
  Filled 2020-06-01 (×5): qty 1

## 2020-06-01 MED ORDER — POLYETHYLENE GLYCOL 3350 17 G PO PACK: 17.0000 g | PACK | Freq: Every day | ORAL | Status: DC | PRN

## 2020-06-01 MED ORDER — METHOCARBAMOL 500 MG PO TABS
500.0000 mg | ORAL_TABLET | Freq: Four times a day (QID) | ORAL | Status: DC | PRN
  Administered 2020-06-02 – 2020-06-06 (×4): 500 mg via ORAL
  Filled 2020-06-01 (×4): qty 1

## 2020-06-01 MED ORDER — PROMETHAZINE HCL 25 MG/ML IJ SOLN: 6.2500 mg | INTRAMUSCULAR | Status: DC | PRN

## 2020-06-01 MED ORDER — GABAPENTIN 300 MG PO CAPS
300.0000 mg | ORAL_CAPSULE | Freq: Every day | ORAL | Status: DC
  Administered 2020-06-02 – 2020-06-06 (×6): 300 mg via ORAL
  Filled 2020-06-01 (×6): qty 1

## 2020-06-01 MED ORDER — SODIUM CHLORIDE 0.9 % IV SOLN: INTRAVENOUS | Status: DC | PRN

## 2020-06-01 MED ORDER — LEVOTHYROXINE SODIUM 25 MCG PO TABS
25.0000 ug | ORAL_TABLET | Freq: Every day | ORAL | Status: DC
  Administered 2020-06-02 – 2020-06-07 (×5): 25 ug via ORAL
  Filled 2020-06-01 (×5): qty 1

## 2020-06-01 MED ORDER — CEFAZOLIN SODIUM 1 G IJ SOLR
INTRAMUSCULAR | Status: AC
  Filled 2020-06-01: qty 20

## 2020-06-01 MED ORDER — ROPIVACAINE HCL 5 MG/ML IJ SOLN
INTRAMUSCULAR | Status: DC | PRN
  Administered 2020-06-01: 15 mL via EPIDURAL

## 2020-06-01 MED ORDER — ISOSORBIDE MONONITRATE ER 30 MG PO TB24
15.0000 mg | ORAL_TABLET | Freq: Every day | ORAL | Status: DC
  Administered 2020-06-02 – 2020-06-05 (×4): 15 mg via ORAL
  Filled 2020-06-01 (×4): qty 1

## 2020-06-01 MED ORDER — DOCUSATE SODIUM 100 MG PO CAPS
100.0000 mg | ORAL_CAPSULE | Freq: Two times a day (BID) | ORAL | Status: DC
  Administered 2020-06-02 – 2020-06-06 (×9): 100 mg via ORAL
  Filled 2020-06-01 (×9): qty 1

## 2020-06-01 MED ORDER — PROPOFOL 10 MG/ML IV BOLUS
INTRAVENOUS | Status: DC | PRN
  Administered 2020-06-01 (×2): 10 mg via INTRAVENOUS
  Administered 2020-06-01: 125 mg via INTRAVENOUS
  Administered 2020-06-01: 10 mg via INTRAVENOUS
  Administered 2020-06-01: 20 mg via INTRAVENOUS

## 2020-06-01 MED ORDER — ONDANSETRON HCL 4 MG PO TABS
4.0000 mg | ORAL_TABLET | Freq: Four times a day (QID) | ORAL | Status: DC | PRN
  Administered 2020-06-07: 4 mg via ORAL
  Filled 2020-06-01: qty 1

## 2020-06-01 MED ORDER — ACETAMINOPHEN 325 MG PO TABS: 325.0000 mg | ORAL_TABLET | Freq: Four times a day (QID) | ORAL | Status: DC | PRN

## 2020-06-01 MED ORDER — OXYCODONE HCL 5 MG PO TABS
10.0000 mg | ORAL_TABLET | ORAL | Status: DC | PRN
  Administered 2020-06-02: 15 mg via ORAL
  Administered 2020-06-02: 10 mg via ORAL
  Administered 2020-06-02 – 2020-06-05 (×5): 15 mg via ORAL
  Filled 2020-06-01 (×6): qty 3

## 2020-06-01 MED ORDER — INSULIN ASPART 100 UNIT/ML ~~LOC~~ SOLN
4.0000 [IU] | Freq: Three times a day (TID) | SUBCUTANEOUS | Status: DC
  Administered 2020-06-02 – 2020-06-06 (×8): 4 [IU] via SUBCUTANEOUS

## 2020-06-01 MED ORDER — CELECOXIB 200 MG PO CAPS
200.0000 mg | ORAL_CAPSULE | Freq: Once | ORAL | Status: AC
  Administered 2020-06-01: 200 mg via ORAL
  Filled 2020-06-01: qty 1

## 2020-06-01 MED ORDER — METOCLOPRAMIDE HCL 5 MG/ML IJ SOLN
5.0000 mg | Freq: Three times a day (TID) | INTRAMUSCULAR | Status: DC | PRN
  Administered 2020-06-02: 10 mg via INTRAVENOUS
  Filled 2020-06-01: qty 2

## 2020-06-01 MED ORDER — SODIUM CHLORIDE 0.9 % IV SOLN: 100.0000 mL | INTRAVENOUS | Status: DC | PRN

## 2020-06-01 MED ORDER — TICAGRELOR 90 MG PO TABS
90.0000 mg | ORAL_TABLET | Freq: Two times a day (BID) | ORAL | Status: DC
  Administered 2020-06-02 – 2020-06-06 (×8): 90 mg via ORAL
  Filled 2020-06-01 (×11): qty 1

## 2020-06-01 MED ORDER — BUPIVACAINE-EPINEPHRINE (PF) 0.5% -1:200000 IJ SOLN
INTRAMUSCULAR | Status: DC | PRN
  Administered 2020-06-01: 20 mL

## 2020-06-01 MED ORDER — HEPARIN SODIUM (PORCINE) 1000 UNIT/ML DIALYSIS
1000.0000 [IU] | INTRAMUSCULAR | Status: DC | PRN
  Filled 2020-06-01: qty 1

## 2020-06-01 MED ORDER — HYDROMORPHONE HCL 1 MG/ML IJ SOLN
0.5000 mg | INTRAMUSCULAR | Status: DC | PRN
  Administered 2020-06-02 – 2020-06-03 (×3): 1 mg via INTRAVENOUS
  Filled 2020-06-01 (×3): qty 1

## 2020-06-01 MED ORDER — CEFAZOLIN SODIUM-DEXTROSE 2-3 GM-%(50ML) IV SOLR
INTRAVENOUS | Status: DC | PRN
  Administered 2020-06-01: 2 g via INTRAVENOUS

## 2020-06-01 MED ORDER — METHOCARBAMOL 1000 MG/10ML IJ SOLN: 500.0000 mg | Freq: Four times a day (QID) | INTRAVENOUS | Status: DC | PRN

## 2020-06-01 MED ORDER — SERTRALINE HCL 25 MG PO TABS
25.0000 mg | ORAL_TABLET | Freq: Every day | ORAL | Status: DC
  Administered 2020-06-02 – 2020-06-05 (×4): 25 mg via ORAL
  Filled 2020-06-01 (×4): qty 1

## 2020-06-01 MED ORDER — ATORVASTATIN CALCIUM 80 MG PO TABS
80.0000 mg | ORAL_TABLET | Freq: Every day | ORAL | Status: DC
  Administered 2020-06-02 – 2020-06-06 (×5): 80 mg via ORAL
  Filled 2020-06-01 (×5): qty 1

## 2020-06-01 MED ORDER — INSULIN GLARGINE 100 UNIT/ML ~~LOC~~ SOLN
10.0000 [IU] | Freq: Every day | SUBCUTANEOUS | Status: DC
  Administered 2020-06-02 – 2020-06-05 (×4): 10 [IU] via SUBCUTANEOUS
  Filled 2020-06-01 (×7): qty 0.1

## 2020-06-01 MED ORDER — MIDAZOLAM HCL 2 MG/2ML IJ SOLN
INTRAMUSCULAR | Status: AC
  Administered 2020-06-01: 2 mg via INTRAVENOUS
  Filled 2020-06-01: qty 2

## 2020-06-01 MED ORDER — FENTANYL CITRATE (PF) 100 MCG/2ML IJ SOLN
INTRAMUSCULAR | Status: AC
  Administered 2020-06-01: 50 ug via INTRAVENOUS
  Filled 2020-06-01: qty 2

## 2020-06-01 MED ORDER — LIDOCAINE-PRILOCAINE 2.5-2.5 % EX CREA
1.0000 "application " | TOPICAL_CREAM | CUTANEOUS | Status: DC | PRN
  Filled 2020-06-01: qty 5

## 2020-06-01 MED ORDER — ASPIRIN EC 81 MG PO TBEC
81.0000 mg | DELAYED_RELEASE_TABLET | Freq: Every day | ORAL | Status: DC
  Administered 2020-06-02 – 2020-06-05 (×4): 81 mg via ORAL
  Filled 2020-06-01 (×4): qty 1

## 2020-06-01 MED ORDER — ACETAMINOPHEN 500 MG PO TABS
1000.0000 mg | ORAL_TABLET | Freq: Once | ORAL | Status: AC
  Administered 2020-06-01: 1000 mg via ORAL
  Filled 2020-06-01: qty 2

## 2020-06-01 MED ORDER — FENTANYL CITRATE (PF) 100 MCG/2ML IJ SOLN
INTRAMUSCULAR | Status: AC
  Administered 2020-06-01: 25 ug via INTRAVENOUS
  Filled 2020-06-01: qty 2

## 2020-06-01 MED ORDER — FENTANYL CITRATE (PF) 100 MCG/2ML IJ SOLN: 50.0000 ug | Freq: Once | INTRAMUSCULAR | Status: AC

## 2020-06-01 MED ORDER — DEXAMETHASONE SODIUM PHOSPHATE 10 MG/ML IJ SOLN
INTRAMUSCULAR | Status: DC | PRN
  Administered 2020-06-01: 4 mg via INTRAVENOUS

## 2020-06-01 MED ORDER — SODIUM CHLORIDE 0.9 % IV SOLN: INTRAVENOUS | Status: DC

## 2020-06-01 MED ORDER — ONDANSETRON HCL 4 MG/2ML IJ SOLN
4.0000 mg | Freq: Four times a day (QID) | INTRAMUSCULAR | Status: DC | PRN
  Administered 2020-06-02 – 2020-06-06 (×4): 4 mg via INTRAVENOUS
  Filled 2020-06-01 (×4): qty 2

## 2020-06-01 MED ORDER — CEFAZOLIN SODIUM-DEXTROSE 1-4 GM/50ML-% IV SOLN
1.0000 g | Freq: Four times a day (QID) | INTRAVENOUS | Status: AC
  Administered 2020-06-02 (×3): 1 g via INTRAVENOUS
  Filled 2020-06-01 (×3): qty 50

## 2020-06-01 MED ORDER — FENTANYL CITRATE (PF) 100 MCG/2ML IJ SOLN: 25.0000 ug | INTRAMUSCULAR | Status: DC | PRN

## 2020-06-01 MED ORDER — OXYCODONE HCL 5 MG PO TABS
5.0000 mg | ORAL_TABLET | ORAL | Status: DC | PRN
  Administered 2020-06-02 (×2): 5 mg via ORAL
  Administered 2020-06-03 – 2020-06-06 (×6): 10 mg via ORAL
  Filled 2020-06-01 (×2): qty 2
  Filled 2020-06-01: qty 1
  Filled 2020-06-01 (×2): qty 2
  Filled 2020-06-01: qty 1
  Filled 2020-06-01 (×3): qty 2

## 2020-06-01 MED ORDER — MAGNESIUM CITRATE PO SOLN: 1.0000 | Freq: Once | ORAL | Status: DC | PRN

## 2020-06-01 MED ORDER — HEPARIN SODIUM (PORCINE) 1000 UNIT/ML IJ SOLN
INTRAMUSCULAR | Status: AC
  Administered 2020-06-01: 3200 [IU]
  Filled 2020-06-01: qty 4

## 2020-06-01 MED ORDER — MIDAZOLAM HCL 2 MG/2ML IJ SOLN: 2.0000 mg | Freq: Once | INTRAMUSCULAR | Status: AC

## 2020-06-01 MED ORDER — PROPOFOL 10 MG/ML IV BOLUS
INTRAVENOUS | Status: AC
  Filled 2020-06-01: qty 20

## 2020-06-01 MED ORDER — SODIUM CHLORIDE 0.9 % IR SOLN
Status: DC | PRN
  Administered 2020-06-01 (×2): 1000 mL

## 2020-06-01 MED ORDER — PENTAFLUOROPROP-TETRAFLUOROETH EX AERO
1.0000 "application " | INHALATION_SPRAY | CUTANEOUS | Status: DC | PRN
  Filled 2020-06-01: qty 116

## 2020-06-01 MED ORDER — LIDOCAINE HCL (PF) 1 % IJ SOLN
5.0000 mL | INTRAMUSCULAR | Status: DC | PRN
  Filled 2020-06-01: qty 5

## 2020-06-01 MED ORDER — INSULIN ASPART 100 UNIT/ML ~~LOC~~ SOLN
0.0000 [IU] | Freq: Three times a day (TID) | SUBCUTANEOUS | Status: DC
  Administered 2020-06-02: 8 [IU] via SUBCUTANEOUS
  Administered 2020-06-02: 3 [IU] via SUBCUTANEOUS
  Administered 2020-06-04: 2 [IU] via SUBCUTANEOUS
  Administered 2020-06-04: 3 [IU] via SUBCUTANEOUS
  Administered 2020-06-06: 2 [IU] via SUBCUTANEOUS

## 2020-06-01 MED ORDER — CARVEDILOL 6.25 MG PO TABS
9.3750 mg | ORAL_TABLET | Freq: Two times a day (BID) | ORAL | Status: DC
  Administered 2020-06-02 – 2020-06-05 (×7): 9.375 mg via ORAL
  Filled 2020-06-01 (×7): qty 1

## 2020-06-01 MED ORDER — METOCLOPRAMIDE HCL 5 MG PO TABS
5.0000 mg | ORAL_TABLET | Freq: Three times a day (TID) | ORAL | Status: DC | PRN
  Administered 2020-06-03: 5 mg via ORAL
  Filled 2020-06-01: qty 1

## 2020-06-01 MED ORDER — LIDOCAINE 2% (20 MG/ML) 5 ML SYRINGE
INTRAMUSCULAR | Status: DC | PRN
  Administered 2020-06-01: 20 mg via INTRAVENOUS
  Administered 2020-06-01: 80 mg via INTRAVENOUS

## 2020-06-01 MED ORDER — CHLORHEXIDINE GLUCONATE CLOTH 2 % EX PADS
6.0000 | MEDICATED_PAD | Freq: Every day | CUTANEOUS | Status: DC
  Administered 2020-06-03 – 2020-06-06 (×2): 6 via TOPICAL

## 2020-06-01 MED ORDER — LIDOCAINE HCL (PF) 2 % IJ SOLN
INTRAMUSCULAR | Status: AC
  Filled 2020-06-01: qty 5

## 2020-06-01 MED ORDER — BISACODYL 10 MG RE SUPP: 10.0000 mg | Freq: Every day | RECTAL | Status: DC | PRN

## 2020-06-01 SURGICAL SUPPLY — 59 items
BANDAGE ESMARK 6X9 LF (GAUZE/BANDAGES/DRESSINGS) ×1 IMPLANT
BIT DRILL CANNULATED 4.6 (BIT) ×3 IMPLANT
BIT DRILL LONG 3.1X160 (DRILL) ×1 IMPLANT
BIT DRILL SOLID LONG 2.8X160 (DRILL) ×1 IMPLANT
BLADE AVERAGE 25MMX9MM (BLADE) ×1
BLADE AVERAGE 25X9 (BLADE) ×2 IMPLANT
BLADE SAW SGTL MED 73X18.5 STR (BLADE) ×3 IMPLANT
BLADE SURG 10 STRL SS (BLADE) IMPLANT
BNDG CMPR 9X6 STRL LF SNTH (GAUZE/BANDAGES/DRESSINGS) ×1
BNDG COHESIVE 4X5 TAN STRL (GAUZE/BANDAGES/DRESSINGS) ×3 IMPLANT
BNDG ESMARK 6X9 LF (GAUZE/BANDAGES/DRESSINGS) ×3
BNDG GAUZE ELAST 4 BULKY (GAUZE/BANDAGES/DRESSINGS) ×3 IMPLANT
COVER MAYO STAND STRL (DRAPES) IMPLANT
COVER SURGICAL LIGHT HANDLE (MISCELLANEOUS) ×3 IMPLANT
COVER WAND RF STERILE (DRAPES) ×3 IMPLANT
DRAPE DERMATAC (DRAPES) ×3 IMPLANT
DRAPE OEC MINIVIEW 54X84 (DRAPES) ×3 IMPLANT
DRAPE U-SHAPE 47X51 STRL (DRAPES) ×3 IMPLANT
DRILL LONG 3.1X160 (DRILL) ×3
DRILL SOLID LONG 2.8X160 (DRILL) ×3
DRSG ADAPTIC 3X8 NADH LF (GAUZE/BANDAGES/DRESSINGS) ×3 IMPLANT
DURAPREP 26ML APPLICATOR (WOUND CARE) ×3 IMPLANT
ELECT REM PT RETURN 9FT ADLT (ELECTROSURGICAL) ×3
ELECTRODE REM PT RTRN 9FT ADLT (ELECTROSURGICAL) ×1 IMPLANT
GAUZE SPONGE 4X4 12PLY STRL (GAUZE/BANDAGES/DRESSINGS) ×3 IMPLANT
GLOVE BIOGEL PI IND STRL 9 (GLOVE) ×1 IMPLANT
GLOVE BIOGEL PI INDICATOR 9 (GLOVE) ×2
GLOVE SURG ORTHO 9.0 STRL STRW (GLOVE) ×3 IMPLANT
GOWN STRL REUS W/ TWL LRG LVL3 (GOWN DISPOSABLE) ×1 IMPLANT
GOWN STRL REUS W/ TWL XL LVL3 (GOWN DISPOSABLE) ×1 IMPLANT
GOWN STRL REUS W/TWL LRG LVL3 (GOWN DISPOSABLE) ×3
GOWN STRL REUS W/TWL XL LVL3 (GOWN DISPOSABLE) ×3
K-WIRE SINGLE TROCAR 2.3X230 (WIRE) ×3
KIT BASIN OR (CUSTOM PROCEDURE TRAY) ×3 IMPLANT
KIT TURNOVER KIT B (KITS) ×3 IMPLANT
KWIRE SINGLE TROCAR 2.3X230 (WIRE) ×1 IMPLANT
NS IRRIG 1000ML POUR BTL (IV SOLUTION) ×6 IMPLANT
PACK ORTHO EXTREMITY (CUSTOM PROCEDURE TRAY) ×3 IMPLANT
PAD ABD 8X10 STRL (GAUZE/BANDAGES/DRESSINGS) ×3 IMPLANT
PAD ARMBOARD 7.5X6 YLW CONV (MISCELLANEOUS) ×6 IMPLANT
PLATE ANT TT REV LT (Plate) ×3 IMPLANT
PREVENA RESTOR AXIOFORM 29X28 (GAUZE/BANDAGES/DRESSINGS) ×3 IMPLANT
PUTTY DBM STAGRAFT PLUS 5CC (Putty) ×3 IMPLANT
SCREW LOCK PLATE R3 4.2X24 (Screw) ×6 IMPLANT
SCREW LOCK PLATE R3 4.2X26 (Screw) ×3 IMPLANT
SCREW LOCK PLATE SB 4.5X30 (Screw) ×3 IMPLANT
SCREW LOCK PLT 26X4.2X GRLL (Screw) ×1 IMPLANT
SCREW NLOCK PLATE SB 4.5X30 (Screw) ×3 IMPLANT
SCREW NONLOCK PLATE R3 4.2X24 (Screw) ×3 IMPLANT
SCREW SHORT THRD 7.0X50X17 (Screw) ×3 IMPLANT
SPONGE LAP 18X18 RF (DISPOSABLE) ×3 IMPLANT
SUCTION FRAZIER HANDLE 10FR (MISCELLANEOUS) ×3
SUCTION TUBE FRAZIER 10FR DISP (MISCELLANEOUS) ×1 IMPLANT
SUT ETHILON 2 0 PSLX (SUTURE) ×9 IMPLANT
TOWEL GREEN STERILE (TOWEL DISPOSABLE) ×3 IMPLANT
TOWEL GREEN STERILE FF (TOWEL DISPOSABLE) ×3 IMPLANT
TUBE CONNECTING 12'X1/4 (SUCTIONS) ×1
TUBE CONNECTING 12X1/4 (SUCTIONS) ×2 IMPLANT
WATER STERILE IRR 1000ML POUR (IV SOLUTION) ×3 IMPLANT

## 2020-06-01 NOTE — Op Note (Signed)
06/01/2020  1:54 PM  PATIENT:  Melanie Hall    PRE-OPERATIVE DIAGNOSIS:  Trimalleolar Left Ankle Fracture  POST-OPERATIVE DIAGNOSIS:  Same  PROCEDURE:  LEFT ANKLE FUSION Application 5 cc allograft bone matrix. Application of axial form wound VAC dressing  SURGEON:  Newt Minion, MD  PHYSICIAN ASSISTANT:None ANESTHESIA:   General  PREOPERATIVE INDICATIONS:  Melanie Hall is a  49 y.o. female with a diagnosis of Trimalleolar Left Ankle Fracture who failed conservative measures and elected for surgical management.    The risks benefits and alternatives were discussed with the patient preoperatively including but not limited to the risks of infection, bleeding, nerve injury, cardiopulmonary complications, the need for revision surgery, among others, and the patient was willing to proceed.  OPERATIVE IMPLANTS: 5 cc demineralized bone matrix  @ENCIMAGES @  OPERATIVE FINDINGS: Extensive destruction of the distal tibia.  Post reduction radiograph showed good alignment with the internal fixation.  OPERATIVE PROCEDURE: Patient brought the operating room after undergoing a regional block.  After adequate levels anesthesia were obtained patient's left lower extremity was prepped using DuraPrep draped into a sterile field a timeout was called.  An anterior incision was made this was carried down through the fascia over the anterior tibial tendon.  The anterior tibial tendon was retracted medially and the incision was carried down through the sheath of the anterior tibial tendon to the tibial talar joint.  Subperiosteal dissection was used to cleanse the tibial talar joint retractors were placed.  Patient had extensive destruction of the medial malleolus this was excised there is extensive destruction of the posterior malleolus and this was reduced and excised.  2 cuts were made perpendicular to the long axis of the tibia across the talar dome and distal tibia.  The wound was irrigated with normal  saline the bony fragments were removed the tibiotalar joint was reduced a locking and compression screw were placed in the distal holes of the talus the plate was then secured with the compression screw proximally of the tibial talar plate.  A compression oblique screw was then placed using the external alignment jig.  This was compressed in the compression screw was also tightened.  An additional locking screw was placed proximally with a total of 4 screws distally.  C-arm fluoroscopy verified alignment.  There is a large void in the posterior aspect of the tibia and this was filled with 5 cc of demineralized bone matrix.  The wound was irrigated normal saline throughout the case incision was closed using 2-0 nylon the axial form wound VAC was applied this had a good suction fit patient was taken the PACU in stable condition   DISCHARGE PLANNING:  Antibiotic duration: 24 hours antibiotics  Weightbearing: Touchdown weightbearing on the left  Pain medication: Opioid pathway high-dose  Dressing care/ Wound VAC: Wound VAC to remain in place for 1 week  Ambulatory devices: Walker  Discharge to: Observation anticipate discharge to home in the next day or 2  Follow-up: In the office 1 week post operative.

## 2020-06-01 NOTE — H&P (Signed)
Melanie Hall is an 49 y.o. female.   Chief Complaint: Left Ankle fracture Dislocation [name] is a 49 year old woman with diabetic insensate neuropathy end-stage renal disease on dialysis Monday Wednesday Friday who fell sustaining a traumatic injury to her left ankle on 05/07/2020.  Patient was evaluated by Dr. Luna Glasgow and underwent a CT scan to further identify the morphology of the fracture on 05/17/2020 patient is seen today in consultation for initial evaluation.  Past Medical History:  Diagnosis Date   Acute cystitis without hematuria    Acute midline thoracic back pain 11/14/3265   Acute systolic heart failure ([place]) 10/21/2019   Anemia    Arthritis    Cataracts, bilateral    surgery to remove   CATARACTS, BILATERAL 07/02/2007   Qualifier: Diagnosis of  By: Isla Pence     Closed fracture of left femur ([place]) 10/28/2018   Closed fracture of right ankle 11/06/2017   Diabetes mellitus    type 2   Emphysematous cystitis 08/26/2018   Encounter for gynecological examination with Papanicolaou smear of cervix 01/14/2020   Encounter for screening fecal occult blood testing 01/14/2020   Encounter for screening for malignant neoplasm of cervix 12/29/2019   Encounter for screening mammogram for malignant neoplasm of breast 12/29/2019   ESRD on hemodialysis ([place])    MWF - in [place]   GERD (gastroesophageal reflux disease)    Hyperlipidemia    Hypertension    Hypokalemia 08/26/2018   IRREGULAR MENSES 09/14/2009   Qualifier: Diagnosis of  By: Hassell Done FNP, [place]kedtra     Loose stools 11/12/2019   Normocytic anemia 08/26/2018   PARONYCHIA, RIGHT GREAT TOE 07/30/2008   Qualifier: Diagnosis of  By: Hassell Done FNP, Nykedtra     Pressure ulcer 09/10/2019   Right arm weakness 08/08/2019   Sprain of left ankle    STEMI (ST elevation myocardial infarction) ([place]) 10/18/2019   STEMI involving right coronary artery ([place]) 10/18/2019   Stroke ([place]) 10/2019   SVD (spontaneous vaginal  delivery)    x 4   Vaginosis 08/26/2018   Weakness 09/10/2019   Weakness of both lower extremities     Past Surgical History:  Procedure Laterality Date   A/V FISTULAGRAM Left 01/12/2020   Procedure: A/V FISTULAGRAM;  Surgeon: Serafina Mitchell, MD;  Location: [facility];  Service: Cardiovascular;  Laterality: Left;   A/V FISTULAGRAM N/A 03/17/2020   Procedure: A/V FISTULAGRAM - Left Arm;  Surgeon: Marty Heck, MD;  Location: [facility];  Service: Cardiovascular;  Laterality: N/A;   AV FISTULA PLACEMENT Left 08/17/2019   Procedure: LEFT BRACHIAL CEPHALIC ARTERIOVENOUS (AV) FISTULA;  Surgeon: Angelia Mould, MD;  Location: [facility];  Service: Vascular;  Laterality: Left;   CORONARY STENT INTERVENTION N/A 10/18/2019   Procedure: CORONARY STENT INTERVENTION;  Surgeon: Sherren Mocha, MD;  Location: [facility];  Service: Cardiovascular;  Laterality: N/A;   CORONARY/GRAFT ACUTE MI REVASCULARIZATION N/A 10/18/2019   Procedure: Coronary/Graft Acute MI Revascularization;  Surgeon: Sherren Mocha, MD;  Location: [facility];  Service: Cardiovascular;  Laterality: N/A;   EYE SURGERY Bilateral    cataracts removed   FEMUR IM NAIL Left 10/28/2018   Procedure: RETROGRADE FEMORAL NAILING;  Surgeon: Meredith Pel, MD;  Location: [facility];  Service: Orthopedics;  Laterality: Left;   IM NAILING FEMORAL SHAFT RETROGRADE Left 10/28/2018   INTRAVASCULAR ULTRASOUND/IVUS N/A 10/18/2019   Procedure: Intravascular Ultrasound/IVUS;  Surgeon: Sherren Mocha, MD;  Location: [facility];  Service:  Cardiovascular;  Laterality: N/A;   IR FLUORO GUIDE CV LINE RIGHT  08/11/2019   IR THORACENTESIS ASP PLEURAL SPACE W/IMG GUIDE  11/12/2019   IR US GUIDE VASC ACCESS RIGHT  08/11/2019   KNEE SURGERY Left    LEFT HEART CATH AND CORONARY ANGIOGRAPHY N/A 10/18/2019   Procedure: LEFT HEART CATH AND CORONARY ANGIOGRAPHY;  Surgeon: Sherren Mocha, MD;  Location: [facility];  Service: Cardiovascular;  Laterality: N/A;   PERIPHERAL VASCULAR BALLOON ANGIOPLASTY Left 01/12/2020   Procedure: PERIPHERAL VASCULAR BALLOON ANGIOPLASTY;  Surgeon: Serafina Mitchell, MD;  Location: [facility];  Service: Cardiovascular;  Laterality: Left;  AVF   PERIPHERAL VASCULAR BALLOON ANGIOPLASTY Left 03/17/2020   Procedure: PERIPHERAL VASCULAR BALLOON ANGIOPLASTY;  Surgeon: Marty Heck, MD;  Location: [facility];  Service: Cardiovascular;  Laterality: Left;  AVF   PERIPHERAL VASCULAR BALLOON ANGIOPLASTY Left 05/05/2020   Procedure: PERIPHERAL VASCULAR BALLOON ANGIOPLASTY;  Surgeon: Marty Heck, MD;  Location: [facility];  Service: Cardiovascular;  Laterality: Left;  arm fistula   RADIOLOGY WITH ANESTHESIA N/A 09/15/2019   Procedure: [facility] AND LUMBER LOWER BACK PAIN;  Surgeon: Radiologist, Medication, MD;  Location: [facility];  Service: Radiology;  Laterality: N/A;   TUBAL LIGATION      Family History  Problem Relation Age of Onset   Diabetes Mother    Hypertension Mother    Bipolar disorder Mother    Brain cancer Maternal Aunt    Heart disease Maternal Grandmother    Diabetes Maternal Grandmother    Breast cancer Maternal Aunt    Depression Father        Committed suicide   Diabetes Sister    Social History:  reports that she quit smoking about 24 years ago. Her smoking use included cigarettes. She has a 0.50 pack-year smoking history. She has never used smokeless tobacco. She reports that she does not drink alcohol and does not use drugs.  Allergies:  Allergies  Allergen Reactions   Contrast Media [Iodinated Diagnostic Agents] Nausea And Vomiting    Treated with Benadryl & Solumedrol   Penicillins Other (See Comments)    Don't want to take PCN due to family history     No medications prior to admission.    No results found for this or any previous visit (from the past 48 hour(s)). No results  found.  Review of Systems  All other systems reviewed and are negative.   Last menstrual period 12/17/2016. Physical Exam  Patient is alert, oriented, no adenopathy, well-dressed, normal affect, normal respiratory effort. Examination patient has brawny edema with venous insufficiency to the left leg she has deformity of the ankle there is no open skin ulcers no open fracture.  She has a palpable dorsalis pedis and posterior tibial pulse the Doppler was used and she has a biphasic dorsalis pedis and posterior tibial pulse.  Review of the radiographs and CT scan shows a large posterior malleolar fracture that is displaced and a large impacted pilon fracture of the tibia involving almost the entire joint line a displaced medial malleolar fracture and a displaced fibular fracture.  The ankle is subluxed with the tibia laterally and anteriorly.Heart RRR Lungs Clear Assessment/Plan Trimalleolar fracture of ankle, closed, left, initial encounter    3. Closed pilon fracture, left, initial encounter     Plan: Patient does not have reconstruction options for the tibiotalar joint.  We will need to proceed with a tibiotalar fusion.  Risks and  benefits were discussed including risk of the skin not healing risk of the bone not healing neurovascular injury.  There is a potential for amputation.  Patient states she wishes to proceed with surgery as soon as possible.  We will plan for surgery tomorrow.  I am concerned with the displacement of the tibia with pressure over the medial skin envelope that patient may develop ischemic skin changes and breakdown.   Bevely Palmer Edgerrin Correia, PA 06/01/2020, 5:46 AM

## 2020-06-01 NOTE — Anesthesia Procedure Notes (Signed)
Anesthesia Regional Block: Popliteal block   Pre-Anesthetic Checklist: ,, timeout performed, Correct Patient, Correct Site, Correct Laterality, Correct Procedure, Correct Position, site marked, Risks and benefits discussed,  Surgical consent,  Pre-op evaluation,  At surgeon's request and post-op pain management  Laterality: Left  Prep: chloraprep       Needles:  Injection technique: Single-shot  Needle Type: Echogenic Stimulator Needle     Needle Length: 9cm  Needle Gauge: 21     Additional Needles:   Procedures:,,,, ultrasound used (permanent image in chart),,,,  Narrative:  Start time: 06/01/2020 12:00 PM End time: 06/01/2020 12:05 PM Injection made incrementally with aspirations every 5 mL.  Performed by: Personally  Anesthesiologist: Effie Berkshire, MD  Additional Notes: Patient tolerated the procedure well. Local anesthetic introduced in an incremental fashion under minimal resistance after negative aspirations. No paresthesias were elicited. After completion of the procedure, no acute issues were identified and patient continued to be monitored by RN.

## 2020-06-01 NOTE — Anesthesia Procedure Notes (Signed)
Anesthesia Regional Block: Adductor canal block   Pre-Anesthetic Checklist: ,, timeout performed, Correct Patient, Correct Site, Correct Laterality, Correct Procedure, Correct Position, site marked, Risks and benefits discussed,  Surgical consent,  Pre-op evaluation,  At surgeon's request and post-op pain management  Laterality: Left  Prep: chloraprep       Needles:  Injection technique: Single-shot  Needle Type: Echogenic Stimulator Needle     Needle Length: 9cm  Needle Gauge: 21     Additional Needles:   Procedures:,,,, ultrasound used (permanent image in chart),,,,  Narrative:  Start time: 06/01/2020 12:05 PM End time: 06/01/2020 12:10 PM Injection made incrementally with aspirations every 5 mL.  Performed by: Personally  Anesthesiologist: Effie Berkshire, MD  Additional Notes: Patient tolerated the procedure well. Local anesthetic introduced in an incremental fashion under minimal resistance after negative aspirations. No paresthesias were elicited. After completion of the procedure, no acute issues were identified and patient continued to be monitored by RN.

## 2020-06-01 NOTE — OR Nursing (Signed)
Pt in dialysis, NAD

## 2020-06-01 NOTE — Anesthesia Preprocedure Evaluation (Addendum)
Anesthesia Evaluation  Patient identified by MRN, date of birth, ID band Patient awake    Reviewed: Allergy & Precautions, NPO status , Patient's Chart, lab work & pertinent test results  Airway Mallampati: II  TM Distance: >3 FB Neck ROM: Full    Dental no notable dental hx. (+) Teeth Intact, Dental Advisory Given   Pulmonary neg pulmonary ROS, former smoker,    Pulmonary exam normal breath sounds clear to auscultation       Cardiovascular hypertension, Pt. on medications + Past MI and + Cardiac Stents  Normal cardiovascular exam Rhythm:Regular Rate:Normal  FINDINGS  Left Ventricle: Left ventricular ejection fraction, by estimation, is 40  to 45%. The left ventricle has mildly decreased function. The left  ventricle demonstrates regional wall motion abnormalities. There is mild  concentric left ventricular hypertrophy.  Elevated left ventricular end-diastolic pressure.   08/14/19 Echo Left ventricular ejection fraction, by visual estimation, is 55 to 60%. The left ventricle has normal function. There is moderately increased left ventricular hypertrophy.   Neuro/Psych PSYCHIATRIC DISORDERS Depression negative neurological ROS     GI/Hepatic GERD  Medicated,  Endo/Other  diabetes, Type 2Morbid obesity  Renal/GU Dialysis and ESRFRenal disease     Musculoskeletal   Abdominal   Peds  Hematology  (+) anemia , Hgb 9.0   Anesthesia Other Findings   Reproductive/Obstetrics                             Anesthesia Physical  Anesthesia Plan  ASA: III  Anesthesia Plan: MAC and Regional   Post-op Pain Management:  Regional for Post-op pain   Induction: Intravenous  PONV Risk Score and Plan: 4 or greater and Treatment may vary due to age or medical condition, Ondansetron, Dexamethasone and Midazolam  Airway Management Planned: LMA  Additional Equipment:   Intra-op Plan:    Post-operative Plan: Extubation in OR  Informed Consent: I have reviewed the patients History and Physical, chart, labs and discussed the procedure including the risks, benefits and alternatives for the proposed anesthesia with the patient or authorized representative who has indicated his/her understanding and acceptance.     Dental advisory given  Plan Discussed with: CRNA, Anesthesiologist and Surgeon  Anesthesia Plan Comments:        Anesthesia Quick Evaluation

## 2020-06-01 NOTE — OR Nursing (Addendum)
Pt is awake,alert and oriented.Pt and/or family verbalized understanding of poc and discharge instructions. Reviewed admission and on going care with receiving RN. Pt is in NAD at this time and is ready to be transferred to floor. Will con't to monitor until pt is transferred. Belongings on bed with patient Report given to linda RN Dialysis RN Report also given to [employer] RN- michelle  Pt will be in dialysis for 4-5 hours

## 2020-06-01 NOTE — Consult Note (Signed)
Reason for Consult: To manage dialysis and dialysis related needs  Referring Physician: Dr Elvera Lennox is an 49 y.o. female.   HPI: Pt is a 54F with a PMH sig for ESRD on HD, DM II, CAD s/p inferior STEMI 10/2019, and trimalleolar ankle fracture who is now seen in consultation at the request of Dr Sharol Given for evaluation and recommendations surrounding provision of HD and management of ESRD.  Pt sustained a left ankle fracture 10/30.  Initially elected for conservative management but unfortunately this failed so she was indicated for ankle fusion today.  She underwent this earlier.  In this setting we are asked to see.  Does dialysis normally MWF [facility] .  Unknown if they are operating on a holiday schedule or not.    Her preoperative K was 6.1.  Has TDC for access, her LUE AVF has been poorly functioning and underwent fistulogram last 10/28.    She's pretty sleepy still and doesn't complain of anything.  Able to converse and answer all my questions.     Past Medical History:  Diagnosis Date  . Acute cystitis without hematuria   . Acute midline thoracic back pain 12/29/2019  . Acute systolic heart failure ([place]) 10/21/2019  . Anemia   . Arthritis   . Cataracts, bilateral    surgery to remove  . CATARACTS, BILATERAL 07/02/2007   Qualifier: Diagnosis of  By: Isla Pence    . Closed fracture of left femur ([place]) 10/28/2018  . Closed fracture of right ankle 11/06/2017  . Diabetes mellitus    type 2  . Emphysematous cystitis 08/26/2018  . Encounter for gynecological examination with Papanicolaou smear of cervix 01/14/2020  . Encounter for screening fecal occult blood testing 01/14/2020  . Encounter for screening for malignant neoplasm of cervix 12/29/2019  . Encounter for screening mammogram for malignant neoplasm of breast 12/29/2019  . ESRD on hemodialysis ([place])    MWF - in [place]  . GERD (gastroesophageal reflux disease)   . Hyperlipidemia   . Hypertension   .  Hypokalemia 08/26/2018  . IRREGULAR MENSES 09/14/2009   Qualifier: Diagnosis of  By: Hassell Done FNP, Tori Milks    . Loose stools 11/12/2019  . Normocytic anemia 08/26/2018  . PARONYCHIA, RIGHT GREAT TOE 07/30/2008   Qualifier: Diagnosis of  By: Hassell Done FNP, Tori Milks    . Pressure ulcer 09/10/2019  . Right arm weakness 08/08/2019  . Sprain of left ankle   . STEMI (ST elevation myocardial infarction) ([place]) 10/18/2019  . STEMI involving right coronary artery ([place]) 10/18/2019  . Stroke ([place]) 10/2019  . SVD (spontaneous vaginal delivery)    x 4  . Vaginosis 08/26/2018  . Weakness 09/10/2019  . Weakness of both lower extremities     Past Surgical History:  Procedure Laterality Date  . A/V FISTULAGRAM Left 01/12/2020   Procedure: A/V FISTULAGRAM;  Surgeon: Serafina Mitchell, MD;  Location: [facility];  Service: Cardiovascular;  Laterality: Left;  . A/V FISTULAGRAM N/A 03/17/2020   Procedure: A/V FISTULAGRAM - Left Arm;  Surgeon: Marty Heck, MD;  Location: [facility];  Service: Cardiovascular;  Laterality: N/A;  . AV FISTULA PLACEMENT Left 08/17/2019   Procedure: LEFT BRACHIAL CEPHALIC ARTERIOVENOUS (AV) FISTULA;  Surgeon: Angelia Mould, MD;  Location: [place] [facility];  Service: Vascular;  Laterality: Left;  . CORONARY STENT INTERVENTION N/A 10/18/2019   Procedure: CORONARY STENT INTERVENTION;  Surgeon: Sherren Mocha, MD;  Location: [facility];  Service: Cardiovascular;  Laterality: N/A;  . CORONARY/GRAFT ACUTE MI REVASCULARIZATION N/A 10/18/2019   Procedure: Coronary/Graft Acute MI Revascularization;  Surgeon: Sherren Mocha, MD;  Location: [facility];  Service: Cardiovascular;  Laterality: N/A;  . EYE SURGERY Bilateral    cataracts removed  . FEMUR IM NAIL Left 10/28/2018   Procedure: RETROGRADE FEMORAL NAILING;  Surgeon: Meredith Pel, MD;  Location: [facility];  Service: Orthopedics;  Laterality: Left;  . IM NAILING FEMORAL SHAFT RETROGRADE Left 10/28/2018  .  INTRAVASCULAR ULTRASOUND/IVUS N/A 10/18/2019   Procedure: Intravascular Ultrasound/IVUS;  Surgeon: Sherren Mocha, MD;  Location: [facility];  Service: Cardiovascular;  Laterality: N/A;  . IR FLUORO GUIDE CV LINE RIGHT  08/11/2019  . IR THORACENTESIS ASP PLEURAL SPACE W/IMG GUIDE  11/12/2019  . IR US GUIDE VASC ACCESS RIGHT  08/11/2019  . KNEE SURGERY Left   . LEFT HEART CATH AND CORONARY ANGIOGRAPHY N/A 10/18/2019   Procedure: LEFT HEART CATH AND CORONARY ANGIOGRAPHY;  Surgeon: Sherren Mocha, MD;  Location: [facility];  Service: Cardiovascular;  Laterality: N/A;  . PERIPHERAL VASCULAR BALLOON ANGIOPLASTY Left 01/12/2020   Procedure: PERIPHERAL VASCULAR BALLOON ANGIOPLASTY;  Surgeon: Serafina Mitchell, MD;  Location: [facility];  Service: Cardiovascular;  Laterality: Left;  AVF  . PERIPHERAL VASCULAR BALLOON ANGIOPLASTY Left 03/17/2020   Procedure: PERIPHERAL VASCULAR BALLOON ANGIOPLASTY;  Surgeon: Marty Heck, MD;  Location: [facility];  Service: Cardiovascular;  Laterality: Left;  AVF  . PERIPHERAL VASCULAR BALLOON ANGIOPLASTY Left 05/05/2020   Procedure: PERIPHERAL VASCULAR BALLOON ANGIOPLASTY;  Surgeon: Marty Heck, MD;  Location: [facility] [place] [facility];  Service: Cardiovascular;  Laterality: Left;  arm fistula  . RADIOLOGY WITH ANESTHESIA N/A 09/15/2019   Procedure: [facility] AND LUMBER LOWER BACK PAIN;  Surgeon: Radiologist, Medication, MD;  Location: [place];  Service: Radiology;  Laterality: N/A;  . TUBAL LIGATION      Family History  Problem Relation Age of Onset  . Diabetes Mother   . Hypertension Mother   . Bipolar disorder Mother   . Brain cancer Maternal Aunt   . Heart disease Maternal Grandmother   . Diabetes Maternal Grandmother   . Breast cancer Maternal Aunt   . Depression Father        Committed suicide  . Diabetes Sister     Social History:  reports that she quit smoking about 24 years ago. Her smoking use included cigarettes. She has  a 0.50 pack-year smoking history. She has never used smokeless tobacco. She reports that she does not drink alcohol and does not use drugs.  Allergies:  Allergies  Allergen Reactions  . Contrast Media [Iodinated Diagnostic Agents] Nausea And Vomiting    Treated with Benadryl & Solumedrol  . Penicillins Other (See Comments)    Don't want to take PCN due to family history     Medications:  Scheduled: . [START ON 06/02/2020] Chlorhexidine Gluconate Cloth  6 each Topical Q0600  . fentaNYL         Results for orders placed or performed during the hospital encounter of 06/01/20 (from the past 48 hour(s))  SARS Coronavirus 2 by RT PCR (hospital order, performed in [facility] hospital lab) Nasopharyngeal Nasopharyngeal Swab     Status: None   Collection Time: 06/01/20 10:37 AM   Specimen: Nasopharyngeal Swab  Result Value Ref Range   SARS Coronavirus 2 NEGATIVE NEGATIVE    Comment: (NOTE) SARS-CoV-2 target nucleic acids are NOT DETECTED.  The SARS-CoV-2 RNA  is generally detectable in upper and lower respiratory specimens during the acute phase of infection. The lowest concentration of SARS-CoV-2 viral copies this assay can detect is 250 copies / mL. A negative result does not preclude SARS-CoV-2 infection and should not be used as the sole basis for treatment or other patient management decisions.  A negative result may occur with improper specimen collection / handling, submission of specimen other than nasopharyngeal swab, presence of viral mutation(s) within the areas targeted by this assay, and inadequate number of viral copies (<250 copies / mL). A negative result must be combined with clinical observations, patient history, and epidemiological information.  Fact Sheet for Patients:   StrictlyIdeas.no  Fact Sheet for Healthcare Providers: BankingDealers.co.za  This test is not yet approved or  cleared by the [place] FDA  and has been authorized for detection and/or diagnosis of SARS-CoV-2 by FDA under an Emergency Use Authorization (EUA).  This EUA will remain in effect (meaning this test can be used) for the duration of the COVID-19 declaration under Section 564(b)(1) of the Act, 21 U.S.C. section 360bbb-3(b)(1), unless the authorization is terminated or revoked sooner.  Performed at [facility], [place] [address] [place], [place] [address]   Glucose, capillary     Status: Abnormal   Collection Time: 06/01/20 10:39 AM  Result Value Ref Range   Glucose-Capillary 287 (H) 70 - 99 mg/dL    Comment: Glucose reference range applies only to samples taken after fasting for at least 8 hours.  I-STAT, chem 8     Status: Abnormal   Collection Time: 06/01/20 11:40 AM  Result Value Ref Range   Sodium 132 (L) 135 - 145 mmol/L   Potassium 6.1 (H) 3.5 - 5.1 mmol/L   Chloride 99 98 - 111 mmol/L   BUN 56 (H) 6 - 20 mg/dL   Creatinine, Ser 6.00 (H) 0.44 - 1.00 mg/dL   Glucose, Bld 290 (H) 70 - 99 mg/dL    Comment: Glucose reference range applies only to samples taken after fasting for at least 8 hours.   Calcium, Ion 1.01 (L) 1.15 - 1.40 mmol/L   TCO2 23 22 - 32 mmol/L   Hemoglobin 9.9 (L) 12.0 - 15.0 g/dL   HCT 29.0 (L) 36 - 46 %  Glucose, capillary     Status: Abnormal   Collection Time: 06/01/20  2:07 PM  Result Value Ref Range   Glucose-Capillary 243 (H) 70 - 99 mg/dL    Comment: Glucose reference range applies only to samples taken after fasting for at least 8 hours.   Comment 1 Notify RN    Comment 2 Document in Chart     No results found.  ROS: all other systems reviewed and are negative except as per HPI Blood pressure 135/68, pulse 75, temperature (!) 97.1 F (36.2 C), resp. rate 16, height 5\' 3"  (1.6 m), weight 65.8 kg, last menstrual period 12/17/2016, SpO2 100 %. . GEN nad, lying flat in bed HEENT EOMI PERRL  NECK no JVD PULM clear bilaterally no c/w/r CV RRR no m/r/g ABD soft EXT no  edema, L leg in sterile dressing NEURO sleepy, arousable ACCESS: R IJ TDC, LUE AVF  Assessment/Plan: 1 L trimalleolar ankle fracture: s/p ankle fusion with Dr Sharol Given 06/01/20 2 ESRD: MWF [facility] K 6.1, will do dialysis tonight 3 Hypertension: UF as BP allows, doesn't appear to be grossly volume overloaded 4. Anemia of ESRD: Hgb 9.9 pre-op, will follow and re-dose ESA as appropriate  5. Metabolic Bone Disease: binders/ VDRA when eating 6.  DM II: per primary 7.  CAD 8.  Dispo: will be admitted  Madelon Lips 06/01/2020, 3:51 PM

## 2020-06-01 NOTE — Transfer of Care (Signed)
Immediate Anesthesia Transfer of Care Note  Patient: Melanie Hall  Procedure(s) Performed: LEFT ANKLE FUSION (Left Ankle)  Patient Location: PACU  Anesthesia Type:MAC and Regional  Level of Consciousness: awake, patient cooperative and responds to stimulation  Airway & Oxygen Therapy: Patient Spontanous Breathing  Post-op Assessment: Report given to RN and Post -op Vital signs reviewed and stable  Post vital signs: Reviewed and stable  Last Vitals:  Vitals Value Taken Time  BP    Temp    Pulse 72 06/01/20 1404  Resp 12 06/01/20 1404  SpO2 100 % 06/01/20 1404  Vitals shown include unvalidated device data.  Last Pain:  Vitals:   06/01/20 1037  TempSrc: Oral         Complications: No complications documented.

## 2020-06-01 NOTE — Anesthesia Postprocedure Evaluation (Signed)
Anesthesia Post Note  Patient: Melanie Hall  Procedure(s) Performed: LEFT ANKLE FUSION (Left Ankle)     Patient location during evaluation: PACU Anesthesia Type: MAC Level of consciousness: awake and alert Pain management: pain level controlled Vital Signs Assessment: post-procedure vital signs reviewed and stable Respiratory status: spontaneous breathing and respiratory function stable Cardiovascular status: stable Postop Assessment: no apparent nausea or vomiting Anesthetic complications: no   No complications documented.  Last Vitals:  Vitals:   06/01/20 1500 06/01/20 1515  BP: 135/68 135/68  Pulse: 70 75  Resp: 12 16  Temp:    SpO2: 97% 100%    Last Pain:  Vitals:   06/01/20 1545  TempSrc:   PainSc: Asleep                 Jaz Laningham DANIEL

## 2020-06-01 NOTE — Interval H&P Note (Signed)
History and Physical Interval Note:  06/01/2020 12:36 PM  Melanie Hall  has presented today for surgery, with the diagnosis of Trimalleolar Left Ankle Fracture.  The various methods of treatment have been discussed with the patient and family. After consideration of risks, benefits and other options for treatment, the patient has consented to  Procedure(s): LEFT ANKLE FUSION (Left) as a surgical intervention.  The patient's history has been reviewed, patient examined, no change in status, stable for surgery.  I have reviewed the patient's chart and labs.  Questions were answered to the patient's satisfaction.     Newt Minion

## 2020-06-02 DIAGNOSIS — E1129 Type 2 diabetes mellitus with other diabetic kidney complication: Secondary | ICD-10-CM | POA: Diagnosis not present

## 2020-06-02 DIAGNOSIS — M898X9 Other specified disorders of bone, unspecified site: Secondary | ICD-10-CM | POA: Diagnosis present

## 2020-06-02 DIAGNOSIS — E11649 Type 2 diabetes mellitus with hypoglycemia without coma: Secondary | ICD-10-CM | POA: Diagnosis not present

## 2020-06-02 DIAGNOSIS — I252 Old myocardial infarction: Secondary | ICD-10-CM | POA: Diagnosis not present

## 2020-06-02 DIAGNOSIS — N186 End stage renal disease: Secondary | ICD-10-CM | POA: Diagnosis not present

## 2020-06-02 DIAGNOSIS — N250 Renal osteodystrophy: Secondary | ICD-10-CM | POA: Diagnosis not present

## 2020-06-02 DIAGNOSIS — E1122 Type 2 diabetes mellitus with diabetic chronic kidney disease: Secondary | ICD-10-CM | POA: Diagnosis present

## 2020-06-02 DIAGNOSIS — K219 Gastro-esophageal reflux disease without esophagitis: Secondary | ICD-10-CM | POA: Diagnosis present

## 2020-06-02 DIAGNOSIS — Z8673 Personal history of transient ischemic attack (TIA), and cerebral infarction without residual deficits: Secondary | ICD-10-CM | POA: Diagnosis not present

## 2020-06-02 DIAGNOSIS — E1140 Type 2 diabetes mellitus with diabetic neuropathy, unspecified: Secondary | ICD-10-CM | POA: Diagnosis present

## 2020-06-02 DIAGNOSIS — E113511 Type 2 diabetes mellitus with proliferative diabetic retinopathy with macular edema, right eye: Secondary | ICD-10-CM | POA: Diagnosis not present

## 2020-06-02 DIAGNOSIS — Z808 Family history of malignant neoplasm of other organs or systems: Secondary | ICD-10-CM | POA: Diagnosis not present

## 2020-06-02 DIAGNOSIS — S82852A Displaced trimalleolar fracture of left lower leg, initial encounter for closed fracture: Secondary | ICD-10-CM | POA: Diagnosis not present

## 2020-06-02 DIAGNOSIS — Z955 Presence of coronary angioplasty implant and graft: Secondary | ICD-10-CM | POA: Diagnosis not present

## 2020-06-02 DIAGNOSIS — I132 Hypertensive heart and chronic kidney disease with heart failure and with stage 5 chronic kidney disease, or end stage renal disease: Secondary | ICD-10-CM | POA: Diagnosis present

## 2020-06-02 DIAGNOSIS — Z981 Arthrodesis status: Secondary | ICD-10-CM | POA: Diagnosis not present

## 2020-06-02 DIAGNOSIS — E785 Hyperlipidemia, unspecified: Secondary | ICD-10-CM | POA: Diagnosis present

## 2020-06-02 DIAGNOSIS — M1990 Unspecified osteoarthritis, unspecified site: Secondary | ICD-10-CM | POA: Diagnosis present

## 2020-06-02 DIAGNOSIS — N3080 Other cystitis without hematuria: Secondary | ICD-10-CM | POA: Diagnosis present

## 2020-06-02 DIAGNOSIS — Z833 Family history of diabetes mellitus: Secondary | ICD-10-CM | POA: Diagnosis not present

## 2020-06-02 DIAGNOSIS — D631 Anemia in chronic kidney disease: Secondary | ICD-10-CM | POA: Diagnosis present

## 2020-06-02 DIAGNOSIS — I5022 Chronic systolic (congestive) heart failure: Secondary | ICD-10-CM | POA: Diagnosis present

## 2020-06-02 DIAGNOSIS — S82872A Displaced pilon fracture of left tibia, initial encounter for closed fracture: Secondary | ICD-10-CM | POA: Diagnosis present

## 2020-06-02 DIAGNOSIS — W19XXXA Unspecified fall, initial encounter: Secondary | ICD-10-CM | POA: Diagnosis present

## 2020-06-02 DIAGNOSIS — G43909 Migraine, unspecified, not intractable, without status migrainosus: Secondary | ICD-10-CM | POA: Diagnosis present

## 2020-06-02 DIAGNOSIS — Z992 Dependence on renal dialysis: Secondary | ICD-10-CM | POA: Diagnosis not present

## 2020-06-02 DIAGNOSIS — Z87891 Personal history of nicotine dependence: Secondary | ICD-10-CM | POA: Diagnosis not present

## 2020-06-02 DIAGNOSIS — I119 Hypertensive heart disease without heart failure: Secondary | ICD-10-CM | POA: Diagnosis not present

## 2020-06-02 DIAGNOSIS — Z20822 Contact with and (suspected) exposure to covid-19: Secondary | ICD-10-CM | POA: Diagnosis present

## 2020-06-02 DIAGNOSIS — I2510 Atherosclerotic heart disease of native coronary artery without angina pectoris: Secondary | ICD-10-CM | POA: Diagnosis not present

## 2020-06-02 LAB — HEMOGLOBIN A1C
Hgb A1c MFr Bld: 8.7 % — ABNORMAL HIGH (ref 4.8–5.6)
Mean Plasma Glucose: 202.99 mg/dL

## 2020-06-02 LAB — GLUCOSE, CAPILLARY
Glucose-Capillary: 292 mg/dL — ABNORMAL HIGH (ref 70–99)
Glucose-Capillary: 111 mg/dL — ABNORMAL HIGH (ref 70–99)
Glucose-Capillary: 155 mg/dL — ABNORMAL HIGH (ref 70–99)
Glucose-Capillary: 75 mg/dL (ref 70–99)

## 2020-06-02 MED ORDER — CHLORHEXIDINE GLUCONATE CLOTH 2 % EX PADS: 6.0000 | MEDICATED_PAD | Freq: Every day | CUTANEOUS | Status: DC

## 2020-06-02 NOTE — Progress Notes (Signed)
Orthopedic Tech Progress Note Patient Details:  Melanie Hall May 09, 1971 [id number] Left boot in patient room Ortho Devices Type of Ortho Device: CAM walker Ortho Device/Splint Interventions: Ordered       Ellouise Newer 06/02/2020, 2:52 PM

## 2020-06-02 NOTE — Progress Notes (Signed)
Patient ID: Melanie Hall, female   DOB: 1970-07-11, 49 y.o.   MRN: [id number] Patient is postoperative day 1 tibiotalar fusion secondary to displaced trimalleolar pilon fracture.  The dressing was clean and dry the wound VAC was plugged in but turned off.  I turned the wound VAC back on and it had a good suction fit.  Patient had dialysis yesterday.  Plan for discharge once patient is safe with ambulation nonweightbearing on the right.  Patient does not have assistance at home.

## 2020-06-02 NOTE — Progress Notes (Signed)
PT Cancellation Note  Patient Details Name: Melanie Hall MRN: [id number] DOB: Aug 08, 1970   Cancelled Treatment:    Reason Eval/Treat Not Completed: Pain and nausea limiting ability to participate   [name] 06/02/2020, 2:59 PM

## 2020-06-02 NOTE — Plan of Care (Signed)
Patient arrived to floor from dialysis. Patient stable, provided a meal, patient complains of migraine, feeling decreased in LLE, no pain in LLE currently. VS stable, will continue monitoring pt.    Problem: Education: Goal: Knowledge of General Education information will improve Description: Including pain rating scale, medication(s)/side effects and non-pharmacologic comfort measures Outcome: Progressing   Problem: Activity: Goal: Risk for activity intolerance will decrease Outcome: Progressing   Problem: Pain Managment: Goal: General experience of comfort will improve Outcome: Progressing   Problem: Safety: Goal: Ability to remain free from injury will improve Outcome: Progressing   Problem: Skin Integrity: Goal: Risk for impaired skin integrity will decrease Outcome: Progressing

## 2020-06-02 NOTE — Progress Notes (Signed)
[name] Progress Note   Subjective:   Reports her ankle is very painful this AM. C/o of chronic migraines, says her PCP referred her to neurology. Also she sometimes has dull sternal pain on palpitaiton since her STEMI in 10/2019, unchanged, says she has seen cardiology about it. No SOB or dizziness.   Objective Vitals:   06/01/20 2356 06/02/20 0358 06/02/20 0816 06/02/20 0820  BP: 140/85 134/63 (!) 93/44 (!) 102/51  Pulse: 97 96 83   Resp: 16 16 18    Temp: 98.2 F (36.8 C) 98.5 F (36.9 C) 98.1 F (36.7 C)   TempSrc: Oral Oral Oral   SpO2: 98% 99% 98%   Weight:      Height:       Physical Exam General: Chronically ill appearing female, alert and in NAD Heart: RRR, no murmurs, rubs or gallops Lungs: CTA bilaterally without wheezing, rhonchi or rales Abdomen: Soft, non-tender, non-distended, +BS Extremities: No edema RLE Dialysis Access: TDC, RUE AVF + bruit  Additional Objective Labs: Basic Metabolic Panel: Recent Labs  Lab 06/01/20 1140  NA 132*  K 6.1*  CL 99  GLUCOSE 290*  BUN 56*  CREATININE 6.00*   CBC: Recent Labs  Lab 06/01/20 1140  HGB 9.9*  HCT 29.0*   Blood Culture    Component Value Date/Time   SDES PLEURAL FLUID 11/12/2019 1412   SDES PLEURAL FLUID 11/12/2019 1412   SPECREQUEST RIGHT LUNG 11/12/2019 1412   SPECREQUEST RIGHT LUNG 11/12/2019 1412   CULT  11/12/2019 1412    NO GROWTH 5 DAYS Performed at [facility], [place] [address], [place], [place] [address]    REPTSTATUS 11/13/2019 FINAL 11/12/2019 1412   REPTSTATUS 11/17/2019 FINAL 11/12/2019 1412   CBG: Recent Labs  Lab 06/01/20 1039 06/01/20 1407 06/01/20 2354 06/02/20 0657  GLUCAP 287* 243* 145* 292*   Medications: . sodium chloride 10 mL/hr at 06/02/20 0011  . sodium chloride    . sodium chloride    .  ceFAZolin (ANCEF) IV 1 g (06/02/20 0511)  . methocarbamol (ROBAXIN) IV     . aspirin EC  81 mg Oral Daily  . atorvastatin  80 mg Oral q1800  .  carvedilol  9.375 mg Oral BID WC  . Chlorhexidine Gluconate Cloth  6 each Topical Q0600  . docusate sodium  100 mg Oral BID  . gabapentin  300 mg Oral QHS  . insulin aspart  0-15 Units Subcutaneous TID WC  . insulin aspart  4 Units Subcutaneous TID WC  . insulin glargine  10 Units Subcutaneous QHS  . isosorbide mononitrate  15 mg Oral Daily  . levothyroxine  25 mcg Oral Q0600  . sertraline  25 mg Oral Daily  . tamsulosin  0.4 mg Oral QPC supper  . ticagrelor  90 mg Oral BID    Dialysis Orders: MWF [facility]  Assessment/Plan: 1 L trimalleolar ankle fracture: s/p ankle fusion with Dr Sharol Given 06/01/20 2 ESRD: M[facility] , K 6.1 yesterday, had HD overnight. Next dialysis tomorrow, 11/26.  3 Hypertension: UF as BP allows, doesn't appear to be grossly volume overloaded 4. Anemia of ESRD: Hgb 9.9 pre-op, will follow and re-dose ESA as appropriate 5. Metabolic Bone Disease: binders/ VDRA when eating 6.  DM II: per primary 7.  CAD 8.  Dispo: will be admitted 9. Sternal pain: Not related to exertion and reproducible with palpation. No associated sx of SOB, palpitations or dizziness. Very low suspicion for cardiac origin and reports she  has already been seen by cardiology, appears musculoskeletal in nature or possibly GERD related.  10. Migraines: Has been referred to neurology outpatient   Melanie Paganini, PA-C 06/02/2020, 8:58 AM  [facility] Pager: [phone]

## 2020-06-02 NOTE — Plan of Care (Signed)

## 2020-06-03 ENCOUNTER — Encounter (HOSPITAL_COMMUNITY): Payer: Self-pay | Admitting: Orthopedic Surgery

## 2020-06-03 LAB — CBC
HCT: 20.4 % — ABNORMAL LOW (ref 36.0–46.0)
Hemoglobin: 6.4 g/dL — CL (ref 12.0–15.0)
MCH: 31.2 pg (ref 26.0–34.0)
MCHC: 31.4 g/dL (ref 30.0–36.0)
MCV: 99.5 fL (ref 80.0–100.0)
Platelets: 184 10*3/uL (ref 150–400)
RBC: 2.05 MIL/uL — ABNORMAL LOW (ref 3.87–5.11)
RDW: 14.3 % (ref 11.5–15.5)
WBC: 7 10*3/uL (ref 4.0–10.5)
nRBC: 0 % (ref 0.0–0.2)

## 2020-06-03 LAB — PREPARE RBC (CROSSMATCH)

## 2020-06-03 LAB — GLUCOSE, CAPILLARY
Glucose-Capillary: 71 mg/dL (ref 70–99)
Glucose-Capillary: 138 mg/dL — ABNORMAL HIGH (ref 70–99)
Glucose-Capillary: 108 mg/dL — ABNORMAL HIGH (ref 70–99)
Glucose-Capillary: 117 mg/dL — ABNORMAL HIGH (ref 70–99)
Glucose-Capillary: 112 mg/dL — ABNORMAL HIGH (ref 70–99)

## 2020-06-03 LAB — RENAL FUNCTION PANEL
Albumin: 2.4 g/dL — ABNORMAL LOW (ref 3.5–5.0)
Anion gap: 14 (ref 5–15)
BUN: 37 mg/dL — ABNORMAL HIGH (ref 6–20)
CO2: 26 mmol/L (ref 22–32)
Calcium: 8.1 mg/dL — ABNORMAL LOW (ref 8.9–10.3)
Chloride: 93 mmol/L — ABNORMAL LOW (ref 98–111)
Creatinine, Ser: 4.77 mg/dL — ABNORMAL HIGH (ref 0.44–1.00)
GFR, Estimated: 11 mL/min — ABNORMAL LOW (ref >60)
Glucose, Bld: 77 mg/dL (ref 70–99)
Phosphorus: 5.4 mg/dL — ABNORMAL HIGH (ref 2.5–4.6)
Potassium: 4.4 mmol/L (ref 3.5–5.1)
Sodium: 133 mmol/L — ABNORMAL LOW (ref 135–145)

## 2020-06-03 LAB — HEPATITIS B CORE ANTIBODY, TOTAL: Hep B Core Total Ab: NONREACTIVE

## 2020-06-03 LAB — HEPATITIS B SURFACE ANTIBODY,QUALITATIVE: Hep B S Ab: NONREACTIVE

## 2020-06-03 LAB — HEPATITIS B SURFACE ANTIGEN: Hepatitis B Surface Ag: NONREACTIVE

## 2020-06-03 MED ORDER — SEVELAMER CARBONATE 800 MG PO TABS
1600.0000 mg | ORAL_TABLET | Freq: Three times a day (TID) | ORAL | Status: DC
  Administered 2020-06-03 – 2020-06-06 (×10): 1600 mg via ORAL
  Filled 2020-06-03 (×11): qty 2

## 2020-06-03 MED ORDER — ALTEPLASE 2 MG IJ SOLR
2.0000 mg | Freq: Once | INTRAMUSCULAR | Status: DC | PRN
  Filled 2020-06-03: qty 2

## 2020-06-03 MED ORDER — HEPARIN SODIUM (PORCINE) 1000 UNIT/ML IJ SOLN
INTRAMUSCULAR | Status: AC
  Administered 2020-06-03: 4000 [IU]
  Filled 2020-06-03: qty 4

## 2020-06-03 MED ORDER — SODIUM CHLORIDE 0.9% IV SOLUTION: Freq: Once | INTRAVENOUS | Status: AC

## 2020-06-03 NOTE — Progress Notes (Signed)
Patient arrived to unit a/o/vx4 from HD, voiced complaints of pain 7/10 (see MAR). Blood consent signed. Lab notified to draw patient type and screen. Awaiting lab. Will continue to monitor patient.

## 2020-06-03 NOTE — Progress Notes (Signed)
PT Cancellation Note  Patient Details Name: Melanie Hall MRN: [id number] DOB: 10/11/1970   Cancelled Treatment:    Reason Eval/Treat Not Completed: Medical issues which prohibited therapy;Patient not medically ready Currently, patient's Hgb is 6.4, patient to receive blood transfusion. PT will re-attempt evaluation following transfusion and improved Hgb.   Perrin Maltese, PT, DPT Acute Rehabilitation Services Pager [phone] Office [phone]    Alda Lea 06/03/2020, 1:49 PM

## 2020-06-03 NOTE — Progress Notes (Signed)
Patient is in hemodialysis today.  Pain is much better today.  She is unable to tolerate PT yesterday due to pain.  Left ankle surgical dressing is clean dry and intact.  Praveena wound VAC has good suction seal.  Plan is to mobilize with PT when pain allows.  Disposition pending PT recommendations.

## 2020-06-03 NOTE — Progress Notes (Signed)
1 unit PRBC started. Patient denies any s/s of transfusion reaction post 76mins blood, v/s stable. Will continue to monitor.

## 2020-06-03 NOTE — Progress Notes (Signed)
Patient transferred to dialysis.

## 2020-06-03 NOTE — Progress Notes (Addendum)
°  [facility] Progress Note   Subjective:  Seen on HD, 2L UFG and tolerating. No CP, dyspnea. C/o painful leg this morning - scheduled pain meds given. Hypoglycemia this morning as well - juice given.  Objective Vitals:   06/03/20 0720 06/03/20 0725 06/03/20 0730 06/03/20 0745  BP: (!) 115/59 (!) 115/55 122/62 (!) 114/57  Pulse: 81 78 80 78  Resp: 17 17 16    Temp: 99 F (37.2 C)     TempSrc: Oral     SpO2: 98%     Weight: 68 kg     Height:       Physical Exam General: Chronically ill appearing woman, NAD. Room air Heart: RRR; no murmur Lungs: CTA anteriorly, no wheezing Abdomen: soft, nontender Extremities: No RLE edema; L leg bandaged Dialysis Access: TDC, LUE AVF + thrill  Additional Objective Labs: Basic Metabolic Panel: Recent Labs  Lab 06/01/20 1140  NA 132*  K 6.1*  CL 99  GLUCOSE 290*  BUN 56*  CREATININE 6.00*   CBC: Recent Labs  Lab 06/01/20 1140  HGB 9.9*  HCT 29.0*   CBG: Recent Labs  Lab 06/02/20 0657 06/02/20 1211 06/02/20 1548 06/02/20 2009 06/03/20 0651  GLUCAP 292* 111* 155* 75 71   Medications:  sodium chloride 10 mL/hr at 06/02/20 0011   sodium chloride     sodium chloride     methocarbamol (ROBAXIN) IV      aspirin EC  81 mg Oral Daily   atorvastatin  80 mg Oral q1800   carvedilol  9.375 mg Oral BID WC   Chlorhexidine Gluconate Cloth  6 each Topical Q0600   docusate sodium  100 mg Oral BID   gabapentin  300 mg Oral QHS   insulin aspart  0-15 Units Subcutaneous TID WC   insulin aspart  4 Units Subcutaneous TID WC   insulin glargine  10 Units Subcutaneous QHS   isosorbide mononitrate  15 mg Oral Daily   levothyroxine  25 mcg Oral Q0600   sertraline  25 mg Oral Daily   tamsulosin  0.4 mg Oral QPC supper   ticagrelor  90 mg Oral BID    Dialysis Orders: MWF [facility]  Assessment/Plan: 1. L trimalleolar ankle fracture: S/p ankle fusion with Dr Sharol Given 06/01/20 2. ESRD:MWF [facility] - HD now per usual schedule. 3. Hypertension:UF as BP allows, doesn't appear to be grossly volume overloaded 4. Anemia of ESRD:Hgb 9.9 pre-op -> 6.4 this morning, will transfuse 1U PRBCs. 5. Metabolic Bone Disease:Phos pending, restart binders. 6. DM II: per primary 7. CAD 8.  Sternal pain: Not related to exertion and reproducible with palpation. No associated sx of SOB, palpitations or dizziness. Very low suspicion for cardiac origin and reports she has already been seen by cardiology, appears musculoskeletal in nature or possibly GERD related.  9. Migraines: Has been referred to neurology outpatient   Veneta Penton, PA-C 06/03/2020, 7:59 AM  [employer]

## 2020-06-04 LAB — TYPE AND SCREEN
ABO/RH(D): O NEG
Antibody Screen: NEGATIVE
Unit division: 0

## 2020-06-04 LAB — BPAM RBC
Blood Product Expiration Date: 202112102359
ISSUE DATE / TIME: 202111261611
Unit Type and Rh: 9500

## 2020-06-04 LAB — GLUCOSE, CAPILLARY
Glucose-Capillary: 80 mg/dL (ref 70–99)
Glucose-Capillary: 241 mg/dL — ABNORMAL HIGH (ref 70–99)
Glucose-Capillary: 169 mg/dL — ABNORMAL HIGH (ref 70–99)
Glucose-Capillary: 134 mg/dL — ABNORMAL HIGH (ref 70–99)
Glucose-Capillary: 42 mg/dL — CL (ref 70–99)
Glucose-Capillary: 53 mg/dL — ABNORMAL LOW (ref 70–99)
Glucose-Capillary: 69 mg/dL — ABNORMAL LOW (ref 70–99)
Glucose-Capillary: 133 mg/dL — ABNORMAL HIGH (ref 70–99)

## 2020-06-04 MED ORDER — DEXTROSE 50 % IV SOLN
INTRAVENOUS | Status: AC
  Administered 2020-06-04: 25 mL
  Filled 2020-06-04: qty 50

## 2020-06-04 NOTE — Progress Notes (Signed)
Patient ID: Melanie Hall, female   DOB: 09-Mar-1971, 49 y.o.   MRN: [id number] Patient is postoperative day 3 left ankle fusion.  There is no drainage in the wound VAC canister.  Physical therapy feels like patient should be safe for discharge to home once she is safe from a therapy standpoint.  Anticipate discharge Monday or Tuesday.

## 2020-06-04 NOTE — Evaluation (Signed)
Physical Therapy Evaluation Patient Details Name: Melanie Hall MRN: [id number] DOB: 09-20-1970 Today's Date: 06/04/2020   History of Present Illness  Patient is a 49 year old woman with diabetic insensate neuropathy end-stage renal disease on dialysis Monday Wednesday Friday who fell sustaining a traumatic injury to her left ankle on 05/07/2020.  Patient nderwent a CT scan to further identify the morphology of the fracture on 05/17/2020 which identified trimalleolar fx of L ankle.  Dr. Sharol Given performed tibiotalar fusion on 06/01/2020.  Clinical Impression  Patient presents with mild dependencies in gait and transfers, mostly limited by TDWB status.  Patient prefers rollator for mobility, but feel she would be better able to comply with TDWB with regular RW.  Patient fairly independent with transfers and safe to d/c at transfer level.  Ideally, would recommend HHPT to progress ambulation and gait training.     Follow Up Recommendations Home health PT    Equipment Recommendations  Rolling walker with 5" wheels    Recommendations for Other Services       Precautions / Restrictions Precautions Precautions: Fall Required Braces or Orthoses: Other Brace Other Brace: Bledsoe brace left LE Restrictions LLE Weight Bearing: Touchdown weight bearing      Mobility  Bed Mobility Overal bed mobility: Independent                  Transfers Overall transfer level: Modified independent Equipment used: 4-wheeled walker Transfers: Sit to/from [facility] Sit to Stand: Modified independent (Device/Increase time) Stand pivot transfers: Modified independent (Device/Increase time)       General transfer comment: mod indep to transfer bed to/from seat on rollator and seat on rollater to/from toilet.  Used grab bars in bathroom for transfer.  sit to stand from toilet using grab bar.  Ambulation/Gait Ambulation/Gait assistance: Supervision Gait Distance (Feet): 10  Feet Assistive device: 4-wheeled walker Gait Pattern/deviations: Step-to pattern;Decreased stride length Gait velocity: decreased   General Gait Details: difficulty maintaining TDWB during gait.  CAM boot on.  Stairs            Wheelchair Mobility    Modified Rankin (Stroke Patients Only)       Balance Overall balance assessment: Needs assistance Sitting-balance support: No upper extremity supported;Feet supported Sitting balance-Leahy Scale: Good     Standing balance support: Bilateral upper extremity supported;During functional activity Standing balance-Leahy Scale: Poor Standing balance comment: reliant on support for balance                             Pertinent Vitals/Pain Pain Assessment: 0-10 Pain Score: 6  Pain Location: LLE Pain Descriptors / Indicators: Guarding;Discomfort;Operative site guarding Pain Intervention(s): Limited activity within patient's tolerance;Monitored during session    Home Living Family/patient expects to be discharged to:: Private residence Living Arrangements: Non-relatives/Friends Available Help at Discharge: Family;Available PRN/intermittently Type of Home: House Home Access: Stairs to enter Entrance Stairs-Rails: None Entrance Stairs-Number of Steps: 1 threshold Home Layout: One level Home Equipment: Bedside commode;Walker - 4 wheels      Prior Function Level of Independence: Independent with assistive device(s)               Hand Dominance        Extremity/Trunk Assessment   Upper Extremity Assessment Upper Extremity Assessment: Generalized weakness    Lower Extremity Assessment Lower Extremity Assessment: Generalized weakness;LLE deficits/detail LLE Deficits / Details: ankle ROM/strength not tested due to fusion       Communication  Communication: No difficulties  Cognition Arousal/Alertness: Awake/alert Behavior During Therapy: WFL for tasks assessed/performed Overall Cognitive Status:  Within Functional Limits for tasks assessed                                        General Comments      Exercises     Assessment/Plan    PT Assessment Patient needs continued PT services  PT Problem List Decreased strength;Decreased activity tolerance;Decreased balance;Decreased mobility;Decreased knowledge of precautions       PT Treatment Interventions DME instruction;Gait training;Functional mobility training;Stair training;Therapeutic activities;Therapeutic exercise;Balance training    PT Goals (Current goals can be found in the Care Plan section)  Acute Rehab PT Goals Patient Stated Goal: go home as soon as possible PT Goal Formulation: With patient Time For Goal Achievement: 06/11/20 Potential to Achieve Goals: Good    Frequency Min 3X/week   Barriers to discharge        Co-evaluation               AM-PAC PT "6 Clicks" Mobility  Outcome Measure Help needed turning from your back to your side while in a flat bed without using bedrails?: None Help needed moving from lying on your back to sitting on the side of a flat bed without using bedrails?: None Help needed moving to and from a bed to a chair (including a wheelchair)?: None Help needed standing up from a chair using your arms (e.g., wheelchair or bedside chair)?: None Help needed to walk in hospital room?: A Little Help needed climbing 3-5 steps with a railing? : A Little 6 Click Score: 22    End of Session Equipment Utilized During Treatment: Other (comment) (CAM boot) Activity Tolerance: Patient tolerated treatment well Patient left: in bed;with call bell/phone within reach;with bed alarm set   PT Visit Diagnosis: Other abnormalities of gait and mobility (R26.89);Difficulty in walking, not elsewhere classified (R26.2)    Time: 9179-1505 PT Time Calculation (min) (ACUTE ONLY): 20 min   Charges:   PT Evaluation $PT Eval Moderate Complexity: 1 Mod           06/04/2020 [name], PT Acute Rehabilitation Services Pager:  [phone] Office:  [phone]    [name] 06/04/2020, 9:59 AM

## 2020-06-04 NOTE — Plan of Care (Signed)

## 2020-06-04 NOTE — Progress Notes (Signed)
Hypoglycemic Event   1933 Hrs  CBG = 39 1938 Hrs  CBG = 42   Treatment: Orange Juice                     Apple sauce  Symptoms: Nausea   Follow-up CBG: Time:2007 CBG Result:53  Possible Reasons for Event: Not eating enough   Patient has no working PIV. IV team paged ASAP. Patient AAOx4 at this time. Reported off to oncoming nurse.

## 2020-06-04 NOTE — Progress Notes (Signed)
  [facility] Progress Note   Subjective:  Seen in room. C/o ankle pain, but otherwise ok. S/p HD yesterday - went fine. No CP/dyspnea.  Objective Vitals:   06/03/20 2011 06/03/20 2137 06/04/20 0406 06/04/20 0842  BP: (!) 118/58 119/61 (!) 118/59 121/61  Pulse: 80 81 80 85  Resp: 18 17 18 17   Temp: 99.5 F (37.5 C) 98.7 F (37.1 C) 98.7 F (37.1 C) 98.3 F (36.8 C)  TempSrc: Oral Oral Oral Oral  SpO2: 98% 99% 96% 96%  Weight:      Height:       Physical Exam General: Chronically ill appearing woman, NAD. Room air Heart: RRR; no murmur Lungs: CTA anteriorly, no wheezing Abdomen: soft, nontender Extremities: No RLE edema; L leg bandaged Dialysis Access: TDC, LUE AVF + thrill  Additional Objective Labs: Basic Metabolic Panel: Recent Labs  Lab 06/01/20 1140 06/03/20 0701  NA 132* 133*  K 6.1* 4.4  CL 99 93*  CO2  --  26  GLUCOSE 290* 77  BUN 56* 37*  CREATININE 6.00* 4.77*  CALCIUM  --  8.1*  PHOS  --  5.4*   Liver Function Tests: Recent Labs  Lab 06/03/20 0701  ALBUMIN 2.4*   CBC: Recent Labs  Lab 06/01/20 1140 06/03/20 0701  WBC  --  7.0  HGB 9.9* 6.4*  HCT 29.0* 20.4*  MCV  --  99.5  PLT  --  184   Medications: . sodium chloride Stopped (06/03/20 1601)  . sodium chloride    . sodium chloride    . methocarbamol (ROBAXIN) IV     . aspirin EC  81 mg Oral Daily  . atorvastatin  80 mg Oral q1800  . carvedilol  9.375 mg Oral BID WC  . Chlorhexidine Gluconate Cloth  6 each Topical Q0600  . docusate sodium  100 mg Oral BID  . gabapentin  300 mg Oral QHS  . insulin aspart  0-15 Units Subcutaneous TID WC  . insulin aspart  4 Units Subcutaneous TID WC  . insulin glargine  10 Units Subcutaneous QHS  . isosorbide mononitrate  15 mg Oral Daily  . levothyroxine  25 mcg Oral Q0600  . sertraline  25 mg Oral Daily  . sevelamer carbonate  1,600 mg Oral TID WC  . tamsulosin  0.4 mg Oral QPC supper  . ticagrelor  90 mg Oral BID     Dialysis Orders: MWF [facility]  Assessment/Plan: 1. L trimalleolar ankle fracture: S/p ankle fusion with Dr Sharol Given 06/01/20 2. ESRD:MWF [facility] - HD now per usual schedule, next HD 11/29. 3. Hypertension:UF as BP allows, doesn't appear to be grossly volume overloaded. 4. Anemia of ESRD:Hgb 9.9 pre-op -> 6.4 on 11/26. Transfused 1U PRBCs. 5. Metabolic Bone Disease:Phos 5.4, continue home binders. 6. DM II: per primary 7. CAD 8. Migraines: Has been referred to neurology outpatient 9. Dispo: Possible d/c home Monday or Tues per notes   Veneta Penton, PA-C 06/04/2020, 10:22 AM  [employer]

## 2020-06-04 NOTE — Plan of Care (Signed)

## 2020-06-04 NOTE — Plan of Care (Signed)
  Problem: Education: Goal: Knowledge of General Education information will improve Description Including pain rating scale, medication(s)/side effects and non-pharmacologic comfort measures Outcome: Progressing   Problem: Health Behavior/Discharge Planning: Goal: Ability to manage health-related needs will improve Outcome: Progressing   

## 2020-06-05 LAB — GLUCOSE, CAPILLARY
Glucose-Capillary: 113 mg/dL — ABNORMAL HIGH (ref 70–99)
Glucose-Capillary: 98 mg/dL (ref 70–99)
Glucose-Capillary: 78 mg/dL (ref 70–99)
Glucose-Capillary: 114 mg/dL — ABNORMAL HIGH (ref 70–99)

## 2020-06-05 NOTE — Progress Notes (Signed)
CBG came up to 69, nurse gave D50  25 ml and it came to 133 after, nurse hold the Lantus due to the incident, will continue to monitor

## 2020-06-05 NOTE — Progress Notes (Signed)
49 yo F with diabetic insensate neuropathy, ESRD on dialysis M, W, F who fell sustaining a traumatic injury to her left ankle. CT identified trimalleolar fx of L ankle. She underwent tibiotalar fusion on 06/01/20.  Received referral to assist with [facility] and DME.  Pt is recommending HHPT and a RW.  Met with pt. Pt lives with a friend. She plans to return home with the support of her friend. She reports that she doesn't need a RW. She has rollator walker. She reports that she doesn't need HHPT because prior to this admission, her PCP ordered outpt rehab and she plans to do her therapy as outpt.

## 2020-06-05 NOTE — Progress Notes (Signed)
  [facility] Progress Note   Subjective:  Seen in room. No CP/dyspnea today. Pain still present. Some hypoglycemia and this morning.  Objective Vitals:   06/04/20 1933 06/04/20 1938 06/05/20 0333 06/05/20 0755  BP: 111/61 (!) 116/59 (!) 119/58 120/64  Pulse: 73 73 73 73  Resp: 18 17 17 18   Temp: 97.9 F (36.6 C) 98.2 F (36.8 C) 98.2 F (36.8 C) 98.1 F (36.7 C)  TempSrc:    Oral  SpO2: 100% 98% 100% 100%  Weight:      Height:       Physical Exam General:Chronically ill appearing woman, NAD. Room air Heart:RRR; no murmur Lungs:CTA anteriorly, no wheezing Abdomen:soft, nontender Extremities:No RLE edema; L leg bandaged Dialysis Access:TDC, LUE AVF + thrill  Additional Objective Labs: Basic Metabolic Panel: Recent Labs  Lab 06/01/20 1140 06/03/20 0701  NA 132* 133*  K 6.1* 4.4  CL 99 93*  CO2  --  26  GLUCOSE 290* 77  BUN 56* 37*  CREATININE 6.00* 4.77*  CALCIUM  --  8.1*  PHOS  --  5.4*   Liver Function Tests: Recent Labs  Lab 06/03/20 0701  ALBUMIN 2.4*   CBC: Recent Labs  Lab 06/01/20 1140 06/03/20 0701  WBC  --  7.0  HGB 9.9* 6.4*  HCT 29.0* 20.4*  MCV  --  99.5  PLT  --  184   Medications: . sodium chloride Stopped (06/03/20 1601)  . sodium chloride    . sodium chloride    . methocarbamol (ROBAXIN) IV     . aspirin EC  81 mg Oral Daily  . atorvastatin  80 mg Oral q1800  . carvedilol  9.375 mg Oral BID WC  . Chlorhexidine Gluconate Cloth  6 each Topical Q0600  . docusate sodium  100 mg Oral BID  . gabapentin  300 mg Oral QHS  . insulin aspart  0-15 Units Subcutaneous TID WC  . insulin aspart  4 Units Subcutaneous TID WC  . insulin glargine  10 Units Subcutaneous QHS  . isosorbide mononitrate  15 mg Oral Daily  . levothyroxine  25 mcg Oral Q0600  . sertraline  25 mg Oral Daily  . sevelamer carbonate  1,600 mg Oral TID WC  . tamsulosin  0.4 mg Oral QPC supper  . ticagrelor  90 mg Oral BID    Dialysis  Orders: MWF [facility]  Assessment/Plan: 1. L trimalleolar ankle fracture:S/p ankle fusion with Dr Sharol Given 06/01/20 2. ESRD:MWF [facility]- next HD 11/29. 3. Hypertension:UF as BP allows, doesn't appear to be grossly volume overloaded. 4. Anemia of ESRD:Hgb 9.9 pre-op-> 6.4 on 11/26. Transfused 1U PRBCs. 5. Metabolic Bone Disease:Phos 5.4, continue home binders. 6. DM II: per primary 7. CAD 8. Migraines: Has been referred to neurology outpatient 9. Dispo: Possible d/c home Monday or Tues per notes  Veneta Penton, PA-C 06/05/2020, 9:34 AM  [employer]

## 2020-06-05 NOTE — Plan of Care (Signed)

## 2020-06-06 LAB — GLUCOSE, CAPILLARY
Glucose-Capillary: 39 mg/dL — CL (ref 70–99)
Glucose-Capillary: 92 mg/dL (ref 70–99)
Glucose-Capillary: 95 mg/dL (ref 70–99)
Glucose-Capillary: 142 mg/dL — ABNORMAL HIGH (ref 70–99)
Glucose-Capillary: 80 mg/dL (ref 70–99)

## 2020-06-06 MED ORDER — OXYCODONE-ACETAMINOPHEN 5-325 MG PO TABS: 1.0000 | ORAL_TABLET | ORAL | 0 refills | Status: DC | PRN

## 2020-06-06 NOTE — Progress Notes (Signed)
PT Cancellation Note  Patient Details Name: Melanie Hall MRN: [id number] DOB: October 29, 1970   Cancelled Treatment:    Reason Eval/Treat Not Completed: Patient declined, no reason specified. Patient was supposed to have dialysis today and then be discharged. She is still waiting to have dialysis. She reports she has been getting up and walking. Does not feel like she needs session today. Will re-attempt tomorrow if patient not discharged.    Montez Stryker 06/06/2020, 3:30 PM

## 2020-06-06 NOTE — Progress Notes (Signed)
Patient ID: Melanie Hall, female   DOB: 14-Oct-1970, 49 y.o.   MRN: [id number] Patient doing well with physical therapy.  Plan for discharge to home after dialysis today.

## 2020-06-06 NOTE — Plan of Care (Signed)

## 2020-06-06 NOTE — Discharge Summary (Signed)
Discharge Diagnoses:  Active Problems:   Pilon fracture of left tibia, closed, initial encounter   Displaced trimalleolar fracture of left lower leg, initial encounter for closed fracture   S/P ankle fusion   Surgeries: Procedure(s): LEFT ANKLE FUSION on 06/01/2020    Consultants: Treatment Team:  Madelon Lips, MD Roney Jaffe, MD  Discharged Condition: Improved  Hospital Course: Melanie Hall is an 49 y.o. female who was admitted 06/01/2020 with a chief complaint of left displaced ankle fracture, with a final diagnosis of Trimalleolar Left Ankle Fracture.  Patient was brought to the operating room on 06/01/2020 and underwent Procedure(s): LEFT ANKLE FUSION.    Patient was given perioperative antibiotics:  Anti-infectives (From admission, onward)   Start     Dose/Rate Route Frequency Ordered Stop   06/02/20 0000  ceFAZolin (ANCEF) IVPB 1 g/50 mL premix        1 g 100 mL/hr over 30 Minutes Intravenous Every 6 hours 06/01/20 2346 06/02/20 1236    .  Patient was given sequential compression devices, early ambulation, and aspirin for DVT prophylaxis.  Recent vital signs:  Patient Vitals for the past 24 hrs:  BP Temp Temp src Pulse Resp SpO2  06/06/20 0833 124/71 98.8 F (37.1 C) Oral 73 18 94 %  06/06/20 0300 138/77 97.9 F (36.6 C) Oral 73 16 97 %  06/05/20 1922 132/70 98 F (36.7 C) -- 81 17 94 %  06/05/20 1248 108/60 98 F (36.7 C) -- 72 18 98 %  .  Recent laboratory studies: No results found.  Discharge Medications:   Allergies as of 06/06/2020      Reactions   Contrast Media [iodinated Diagnostic Agents] Nausea And Vomiting   Treated with Benadryl & Solumedrol   Penicillins Other (See Comments)   Don't want to take PCN due to family history       Medication List    STOP taking these medications   HYDROcodone-acetaminophen 5-325 MG tablet Commonly known as: NORCO/VICODIN     TAKE these medications   aspirin 81 MG EC tablet Take 1 tablet (81 mg  total) by mouth daily.   atorvastatin 80 MG tablet Commonly known as: LIPITOR Take 1 tablet (80 mg total) by mouth daily at 6 PM.   carvedilol 6.25 MG tablet Commonly known as: COREG Take 1.5 tablets (9.375 mg total) by mouth 2 (two) times daily.   epoetin alfa 3000 UNIT/ML injection Commonly known as: EPOGEN Inject 3,000 Units into the skin every 14 (fourteen) days.   gabapentin 300 MG capsule Commonly known as: NEURONTIN Take 1 capsule (300 mg total) by mouth at bedtime.   hydrALAZINE 10 MG tablet Commonly known as: APRESOLINE Take 1 tablet (10 mg total) by mouth in the morning and at bedtime.   insulin aspart protamine- aspart (70-30) 100 UNIT/ML injection Commonly known as: NOVOLOG MIX 70/30 Inject 0.1 mLs (10 Units total) into the skin 2 (two) times daily with a meal.   Insulin Syringes (Disposable) U-100 1 ML Misc Use to inject insulin twice daily dx e11.65   isosorbide mononitrate 30 MG 24 hr tablet Commonly known as: IMDUR Take 0.5 tablets (15 mg total) by mouth daily.   levothyroxine 25 MCG tablet Commonly known as: SYNTHROID Take 1 tablet (25 mcg total) by mouth daily before breakfast.   multivitamin Tabs tablet Take 1 tablet by mouth at bedtime.   nitroGLYCERIN 0.4 MG SL tablet Commonly known as: NITROSTAT Place 1 tablet (0.4 mg total) under the tongue every 5 (  five) minutes x 3 doses as needed for chest pain. What changed:   when to take this  reasons to take this   ondansetron 4 MG disintegrating tablet Commonly known as: Zofran ODT 4mg  ODT q4 hours prn nausea/vomit What changed:   how much to take  how to take this  when to take this  reasons to take this  additional instructions   oxyCODONE-acetaminophen 5-325 MG tablet Commonly known as: Percocet Take 1 tablet by mouth every 4 (four) hours as needed. What changed: reasons to take this   sertraline 25 MG tablet Commonly known as: ZOLOFT Take 1 tablet (25 mg total) by mouth daily.    tamsulosin 0.4 MG Caps capsule Commonly known as: FLOMAX Take 1 capsule (0.4 mg total) by mouth daily after supper.   ticagrelor 90 MG Tabs tablet Commonly known as: BRILINTA Take 1 tablet (90 mg total) by mouth 2 (two) times daily.            Durable Medical Equipment  (From admission, onward)         Start     Ordered   06/01/20 2347  For home use only DME Vac  Once        06/01/20 2346           Discharge Care Instructions  (From admission, onward)         Start     Ordered   06/06/20 0000  Non weight bearing        06/06/20 0839          Diagnostic Studies: DG Ankle Complete Left  Result Date: 05/07/2020 CLINICAL DATA:  Fall, twisting ankle. EXAM: LEFT ANKLE COMPLETE - 3+ VIEW COMPARISON:  None. FINDINGS: Fracture noted the base of the medial malleolus. Avulsion fracture off the lateral talus. There appears to be irregularity of the lateral talar dome. Widening of the ankle mortise. Diffuse soft tissue swelling. IMPRESSION: Fracture the base of the medial malleolus. Concern for fracture through the lateral talar dome. Avulsion fracture off the lateral talus. Electronically Signed   By: Rolm Baptise M.D.   On: 05/07/2020 21:28   CT ANKLE LEFT WO CONTRAST  Result Date: 05/17/2020 CLINICAL DATA:  The patient suffered a fall resulting in left ankle fractures 05/07/2020. Subsequent encounter. EXAM: CT OF THE LEFT ANKLE WITHOUT CONTRAST TECHNIQUE: Multidetector CT imaging of the left ankle was performed according to the standard protocol. Multiplanar CT image reconstructions were also generated. COMPARISON:  Plain films left ankle 05/07/2020. FINDINGS: Bones/Joint/Cartilage The patient has a mildly comminuted fracture of the posterior malleolus of the tibia. The fracture fragment measures 2.5 cm transverse by 1.5 cm AP at the articular surface by 1.8 cm craniocaudal. The fragment is triangular in shape with its apex superiorly. Fragment is posteriorly displaced  approximately 1.4 cm resulting in a large defect in the articular surface of the tibia. There is early remodeling of the tibia at the site of this defect. The talus is laterally displaced approximately 1.3 cm. The patient also has a fracture of the medial malleolus. The fragment is medially displaced 0.7 cm and superiorly displaced approximately 1 cm. Also seen is a mildly impacted fracture of the lateral malleolus. There is approximately 30 degrees medial angulation of the distal fracture fragment. Also seen is a nondisplaced fracture of the cuboid. The fracture extends from the central aspect of the articular surface of the calcaneocuboid joint distally through the mid aspect of the cuboid superiorly. Nondisplaced fracture of the talus  at the middle facet of the subtalar joint is identified. No other fracture is seen. Ligaments Suboptimally assessed by CT. Muscles and Tendons As visualized by CT scan, tendons appear intact. No evidence of tendon entrapment. Soft tissues Marked soft tissue swelling is present about the ankle. There is synovitis about the ankle joint. IMPRESSION: Displaced trimalleolar fracture as described above. As noted, the talus is laterally displaced up to 1.3 cm off the plafond and posterior displacement large fragment of the posterior malleolus results in a gap at the articular surface which demonstrates early remodeling compatible with pseudoarthrosis formation. Acute, nondisplaced fractures of the cuboid and talus at the middle facet of the subtalar joint. Soft tissue swelling and synovitis about the ankle. Electronically Signed   By: Inge Rise M.D.   On: 05/17/2020 15:22    Patient benefited maximally from their hospital stay and there were no complications.     Disposition: Discharge disposition: 01-Home or Self Care      Discharge Instructions    Call MD / Call 911   Complete by: As directed    If you experience chest pain or shortness of breath, CALL 911 and be  transported to the hospital emergency room.  If you develope a fever above 101 F, pus (white drainage) or increased drainage or redness at the wound, or calf pain, call your surgeon's office.   Constipation Prevention   Complete by: As directed    Drink plenty of fluids.  Prune juice may be helpful.  You may use a stool softener, such as Colace (over the counter) 100 mg twice a day.  Use MiraLax (over the counter) for constipation as needed.   Diet - low sodium heart healthy   Complete by: As directed    Discharge instructions   Complete by: As directed    Show patient how to attach prevena vac. Should call office if beeps   Increase activity slowly as tolerated   Complete by: As directed    Negative Pressure Wound Therapy - Incisional   Complete by: As directed    Negative Pressure Wound Therapy - Incisional   Complete by: As directed    Show patient how to attach prevena vac   Non weight bearing   Complete by: As directed       Follow-up Information    Newt Minion, MD In 1 week.   Specialty: Orthopedic Surgery Contact information: [address] [place] [address] [phone]                Signed: Bevely Palmer Enma Hall 06/06/2020, 8:41 AM

## 2020-06-06 NOTE — Progress Notes (Signed)
[facility] Progress Note   Subjective: Seen in room, waiting for HD. No C/Os.     Objective Vitals:   06/05/20 1248 06/05/20 1922 06/06/20 0300 06/06/20 0833  BP: 108/60 132/70 138/77 124/71  Pulse: 72 81 73 73  Resp: 18 17 16 18   Temp: 98 F (36.7 C) 98 F (36.7 C) 97.9 F (36.6 C) 98.8 F (37.1 C)  TempSrc:   Oral Oral  SpO2: 98% 94% 97% 94%  Weight:      Height:       Physical Exam General: Chronically ill appearing female in NAD Heart: S1,S2 No M/R/G Lungs: CTAB A/P Abdomen: ND, NT Extremities: L weight bearing boot LLE, wound vac in place. No RLE edema Dialysis Access: RIJ TDC drsg intact. AVF pulsatile + bruit.   Additional Objective Labs: Basic Metabolic Panel: Recent Labs  Lab 06/01/20 1140 06/03/20 0701  NA 132* 133*  K 6.1* 4.4  CL 99 93*  CO2  --  26  GLUCOSE 290* 77  BUN 56* 37*  CREATININE 6.00* 4.77*  CALCIUM  --  8.1*  PHOS  --  5.4*   Liver Function Tests: Recent Labs  Lab 06/03/20 0701  ALBUMIN 2.4*   No results for input(s): LIPASE, AMYLASE in the last 168 hours. CBC: Recent Labs  Lab 06/01/20 1140 06/03/20 0701  WBC  --  7.0  HGB 9.9* 6.4*  HCT 29.0* 20.4*  MCV  --  99.5  PLT  --  184   Blood Culture    Component Value Date/Time   SDES PLEURAL FLUID 11/12/2019 1412   SDES PLEURAL FLUID 11/12/2019 1412   SPECREQUEST RIGHT LUNG 11/12/2019 1412   SPECREQUEST RIGHT LUNG 11/12/2019 1412   CULT  11/12/2019 1412    NO GROWTH 5 DAYS Performed at [facility], [place] [address], [place], [place] [address]    REPTSTATUS 11/13/2019 FINAL 11/12/2019 1412   REPTSTATUS 11/17/2019 FINAL 11/12/2019 1412    Cardiac Enzymes: No results for input(s): CKTOTAL, CKMB, CKMBINDEX, TROPONINI in the last 168 hours. CBG: Recent Labs  Lab 06/05/20 0651 06/05/20 1247 06/05/20 1617 06/05/20 1923 06/06/20 0640  GLUCAP 113* 98 78 114* 92   Iron Studies: No results for input(s): IRON, TIBC, TRANSFERRIN, FERRITIN in the  last 72 hours. @lablastinr3 @ Studies/Results: No results found. Medications: . sodium chloride Stopped (06/03/20 1601)  . sodium chloride    . sodium chloride    . methocarbamol (ROBAXIN) IV     . aspirin EC  81 mg Oral Daily  . atorvastatin  80 mg Oral q1800  . carvedilol  9.375 mg Oral BID WC  . Chlorhexidine Gluconate Cloth  6 each Topical Q0600  . docusate sodium  100 mg Oral BID  . gabapentin  300 mg Oral QHS  . insulin aspart  0-15 Units Subcutaneous TID WC  . insulin aspart  4 Units Subcutaneous TID WC  . insulin glargine  10 Units Subcutaneous QHS  . isosorbide mononitrate  15 mg Oral Daily  . levothyroxine  25 mcg Oral Q0600  . sertraline  25 mg Oral Daily  . sevelamer carbonate  1,600 mg Oral TID WC  . tamsulosin  0.4 mg Oral QPC supper  . ticagrelor  90 mg Oral BID     Dialysis Orders: MWF [facility]  Assessment/Plan: 1. L trimalleolar ankle fracture:S/p ankle fusion with Dr Sharol Given 06/01/20 2. ESRD:MWF [facility]- HD today on schedule.  3. Hypertension:UF as BP allows, doesn't appear to be grossly  volume overloaded. 4. Anemia of ESRD:Hgb 9.9 pre-op-> 6.4on 11/26. Transfused1U PRBCs. No repeat labs yet.  5. Metabolic Bone GPQDIYM:EBRA3.0, continue home binders. 6. DM II: per primary 7. CAD 8. Migraines: Has been referred to neurology outpatient Dispo: Possible d/c home today post HD  Sharlyne Koeneman H. Lora Chavers NP-C 06/06/2020, 10:48 AM  [employer] [phone]

## 2020-06-07 ENCOUNTER — Encounter (HOSPITAL_COMMUNITY): Payer: Medicaid Other

## 2020-06-07 DIAGNOSIS — Z992 Dependence on renal dialysis: Secondary | ICD-10-CM | POA: Diagnosis not present

## 2020-06-07 DIAGNOSIS — E113511 Type 2 diabetes mellitus with proliferative diabetic retinopathy with macular edema, right eye: Secondary | ICD-10-CM | POA: Diagnosis not present

## 2020-06-07 DIAGNOSIS — N186 End stage renal disease: Secondary | ICD-10-CM | POA: Diagnosis not present

## 2020-06-07 LAB — GLUCOSE, CAPILLARY: Glucose-Capillary: 86 mg/dL (ref 70–99)

## 2020-06-07 NOTE — Plan of Care (Signed)
  Problem: Education: Goal: Knowledge of General Education information will improve Description: Including pain rating scale, medication(s)/side effects and non-pharmacologic comfort measures 06/07/2020 0553 by Burman Foster, RN Outcome: Adequate for Discharge 06/07/2020 0553 by Burman Foster, RN Outcome: Adequate for Discharge   Problem: Health Behavior/Discharge Planning: Goal: Ability to manage health-related needs will improve 06/07/2020 0553 by Burman Foster, RN Outcome: Adequate for Discharge 06/07/2020 0553 by Burman Foster, RN Outcome: Adequate for Discharge   Problem: Education: Goal: Knowledge of General Education information will improve Description: Including pain rating scale, medication(s)/side effects and non-pharmacologic comfort measures 06/07/2020 0553 by Burman Foster, RN Outcome: Adequate for Discharge 06/07/2020 0553 by Burman Foster, RN Outcome: Adequate for Discharge   Problem: Education: Goal: Knowledge of General Education information will improve Description: Including pain rating scale, medication(s)/side effects and non-pharmacologic comfort measures 06/07/2020 0553 by Burman Foster, RN Outcome: Adequate for Discharge 06/07/2020 0553 by Burman Foster, RN Outcome: Adequate for Discharge   Problem: Health Behavior/Discharge Planning: Goal: Ability to manage health-related needs will improve 06/07/2020 0553 by Burman Foster, RN Outcome: Adequate for Discharge 06/07/2020 0553 by Burman Foster, RN Outcome: Adequate for Discharge   Problem: Clinical Measurements: Goal: Ability to maintain clinical measurements within normal limits will improve 06/07/2020 0553 by Burman Foster, RN Outcome: Adequate for Discharge 06/07/2020 0553 by Burman Foster, RN Outcome: Adequate for Discharge   Problem: Clinical Measurements: Goal: Will remain free from  infection 06/07/2020 0553 by Burman Foster, RN Outcome: Adequate for Discharge 06/07/2020 0553 by Burman Foster, RN Outcome: Adequate for Discharge   Problem: Clinical Measurements: Goal: Diagnostic test results will improve 06/07/2020 0553 by Burman Foster, RN Outcome: Adequate for Discharge 06/07/2020 0553 by Burman Foster, RN Outcome: Adequate for Discharge   Problem: Clinical Measurements: Goal: Respiratory complications will improve 06/07/2020 0553 by Burman Foster, RN Outcome: Adequate for Discharge 06/07/2020 0553 by Burman Foster, RN Outcome: Adequate for Discharge   Problem: Clinical Measurements: Goal: Cardiovascular complication will be avoided 06/07/2020 0553 by Burman Foster, RN Outcome: Adequate for Discharge 06/07/2020 0553 by Burman Foster, RN Outcome: Adequate for Discharge   Problem: Activity: Goal: Risk for activity intolerance will decrease 06/07/2020 0553 by Burman Foster, RN Outcome: Adequate for Discharge 06/07/2020 0553 by Burman Foster, RN Outcome: Adequate for Discharge   Problem: Nutrition: Goal: Adequate nutrition will be maintained 06/07/2020 0553 by Burman Foster, RN Outcome: Adequate for Discharge 06/07/2020 0553 by Burman Foster, RN Outcome: Adequate for Discharge   Problem: Coping: Goal: Level of anxiety will decrease 06/07/2020 0553 by Burman Foster, RN Outcome: Adequate for Discharge 06/07/2020 0553 by Burman Foster, RN Outcome: Adequate for Discharge   Problem: Elimination: Goal: Will not experience complications related to bowel motility 06/07/2020 0553 by Burman Foster, RN Outcome: Adequate for Discharge 06/07/2020 0553 by Burman Foster, RN Outcome: Adequate for Discharge   Problem: Pain Managment: Goal: General experience of comfort will improve 06/07/2020 0553 by Burman Foster,  RN Outcome: Adequate for Discharge 06/07/2020 0553 by Burman Foster, RN Outcome: Adequate for Discharge   Problem: Safety: Goal: Ability to remain free from injury will improve 06/07/2020 0553 by Burman Foster, RN Outcome: Adequate for Discharge 06/07/2020 0553 by Burman Foster, RN Outcome: Adequate for Discharge   Problem: Skin Integrity: Goal: Risk for impaired skin integrity will decrease 06/07/2020 0553 by Burman Foster, RN Outcome: Adequate for Discharge 06/07/2020 0553 by Burman Foster, RN Outcome: Adequate for Discharge

## 2020-06-07 NOTE — Progress Notes (Signed)
Discharge notes faxed to patient's HD clinic to provide continuity of care.  Alphonzo Cruise, [place] [facility] [phone]

## 2020-06-07 NOTE — Progress Notes (Signed)
Patient refused dialysis this morning, she said they should have done it yesterday, and she cant afford to miss her 2 appointment this morning. Education provided,  Attending Doctor Sharol Given informed, patient waiting for her ride to go home, AVS discussed with patient already, patient has no question will continue to monitor.

## 2020-06-07 NOTE — Discharge Summary (Signed)
Discharge Diagnoses:  Active Problems:   Pilon fracture of left tibia, closed, initial encounter   Displaced trimalleolar fracture of left lower leg, initial encounter for closed fracture   S/P ankle fusion   Surgeries: Procedure(s): LEFT ANKLE FUSION on 06/01/2020    Consultants: Treatment Team:  Roney Jaffe, MD  Discharged Condition: Improved  Hospital Course: Melanie Hall is an 49 y.o. female who was admitted 06/01/2020 with a chief complaint of left ankle pain, with a final diagnosis of Trimalleolar Left Ankle Fracture.  Patient was brought to the operating room on 06/01/2020 and underwent Procedure(s): LEFT ANKLE FUSION.    Patient was given perioperative antibiotics:  Anti-infectives (From admission, onward)   Start     Dose/Rate Route Frequency Ordered Stop   06/02/20 0000  ceFAZolin (ANCEF) IVPB 1 g/50 mL premix        1 g 100 mL/hr over 30 Minutes Intravenous Every 6 hours 06/01/20 2346 06/02/20 1236    .  Patient was given sequential compression devices, early ambulation, and aspirin for DVT prophylaxis.  Recent vital signs:  Patient Vitals for the past 24 hrs:  BP Temp Temp src Pulse Resp SpO2  06/07/20 0300 106/61 98.2 F (36.8 C) Oral 61 15 98 %  06/06/20 2000 126/70 98.6 F (37 C) Oral 68 15 100 %  06/06/20 1342 123/64 98 F (36.7 C) -- 72 18 99 %  06/06/20 0833 124/71 98.8 F (37.1 C) Oral 73 18 94 %  .  Recent laboratory studies: No results found.  Discharge Medications:   Allergies as of 06/07/2020      Reactions   Contrast Media [iodinated Diagnostic Agents] Nausea And Vomiting   Treated with Benadryl & Solumedrol   Penicillins Other (See Comments)   Don't want to take PCN due to family history       Medication List    STOP taking these medications   HYDROcodone-acetaminophen 5-325 MG tablet Commonly known as: NORCO/VICODIN     TAKE these medications   aspirin 81 MG EC tablet Take 1 tablet (81 mg total) by mouth daily.    atorvastatin 80 MG tablet Commonly known as: LIPITOR Take 1 tablet (80 mg total) by mouth daily at 6 PM.   carvedilol 6.25 MG tablet Commonly known as: COREG Take 1.5 tablets (9.375 mg total) by mouth 2 (two) times daily.   epoetin alfa 3000 UNIT/ML injection Commonly known as: EPOGEN Inject 3,000 Units into the skin every 14 (fourteen) days.   gabapentin 300 MG capsule Commonly known as: NEURONTIN Take 1 capsule (300 mg total) by mouth at bedtime.   hydrALAZINE 10 MG tablet Commonly known as: APRESOLINE Take 1 tablet (10 mg total) by mouth in the morning and at bedtime.   insulin aspart protamine- aspart (70-30) 100 UNIT/ML injection Commonly known as: NOVOLOG MIX 70/30 Inject 0.1 mLs (10 Units total) into the skin 2 (two) times daily with a meal.   Insulin Syringes (Disposable) U-100 1 ML Misc Use to inject insulin twice daily dx e11.65   isosorbide mononitrate 30 MG 24 hr tablet Commonly known as: IMDUR Take 0.5 tablets (15 mg total) by mouth daily.   levothyroxine 25 MCG tablet Commonly known as: SYNTHROID Take 1 tablet (25 mcg total) by mouth daily before breakfast.   multivitamin Tabs tablet Take 1 tablet by mouth at bedtime.   nitroGLYCERIN 0.4 MG SL tablet Commonly known as: NITROSTAT Place 1 tablet (0.4 mg total) under the tongue every 5 (five) minutes x 3  doses as needed for chest pain. What changed:   when to take this  reasons to take this   ondansetron 4 MG disintegrating tablet Commonly known as: Zofran ODT 4mg  ODT q4 hours prn nausea/vomit What changed:   how much to take  how to take this  when to take this  reasons to take this  additional instructions   oxyCODONE-acetaminophen 5-325 MG tablet Commonly known as: Percocet Take 1 tablet by mouth every 4 (four) hours as needed. What changed: reasons to take this   sertraline 25 MG tablet Commonly known as: ZOLOFT Take 1 tablet (25 mg total) by mouth daily.   tamsulosin 0.4 MG Caps  capsule Commonly known as: FLOMAX Take 1 capsule (0.4 mg total) by mouth daily after supper.   ticagrelor 90 MG Tabs tablet Commonly known as: BRILINTA Take 1 tablet (90 mg total) by mouth 2 (two) times daily.            Durable Medical Equipment  (From admission, onward)         Start     Ordered   06/01/20 2347  For home use only DME Vac  Once        06/01/20 2346           Discharge Care Instructions  (From admission, onward)         Start     Ordered   06/06/20 0000  Non weight bearing        06/06/20 0839          Diagnostic Studies: CT ANKLE LEFT WO CONTRAST  Result Date: 05/17/2020 CLINICAL DATA:  The patient suffered a fall resulting in left ankle fractures 05/07/2020. Subsequent encounter. EXAM: CT OF THE LEFT ANKLE WITHOUT CONTRAST TECHNIQUE: Multidetector CT imaging of the left ankle was performed according to the standard protocol. Multiplanar CT image reconstructions were also generated. COMPARISON:  Plain films left ankle 05/07/2020. FINDINGS: Bones/Joint/Cartilage The patient has a mildly comminuted fracture of the posterior malleolus of the tibia. The fracture fragment measures 2.5 cm transverse by 1.5 cm AP at the articular surface by 1.8 cm craniocaudal. The fragment is triangular in shape with its apex superiorly. Fragment is posteriorly displaced approximately 1.4 cm resulting in a large defect in the articular surface of the tibia. There is early remodeling of the tibia at the site of this defect. The talus is laterally displaced approximately 1.3 cm. The patient also has a fracture of the medial malleolus. The fragment is medially displaced 0.7 cm and superiorly displaced approximately 1 cm. Also seen is a mildly impacted fracture of the lateral malleolus. There is approximately 30 degrees medial angulation of the distal fracture fragment. Also seen is a nondisplaced fracture of the cuboid. The fracture extends from the central aspect of the articular  surface of the calcaneocuboid joint distally through the mid aspect of the cuboid superiorly. Nondisplaced fracture of the talus at the middle facet of the subtalar joint is identified. No other fracture is seen. Ligaments Suboptimally assessed by CT. Muscles and Tendons As visualized by CT scan, tendons appear intact. No evidence of tendon entrapment. Soft tissues Marked soft tissue swelling is present about the ankle. There is synovitis about the ankle joint. IMPRESSION: Displaced trimalleolar fracture as described above. As noted, the talus is laterally displaced up to 1.3 cm off the plafond and posterior displacement large fragment of the posterior malleolus results in a gap at the articular surface which demonstrates early remodeling compatible with pseudoarthrosis formation.  Acute, nondisplaced fractures of the cuboid and talus at the middle facet of the subtalar joint. Soft tissue swelling and synovitis about the ankle. Electronically Signed   By: Inge Rise M.D.   On: 05/17/2020 15:22    Patient benefited maximally from their hospital stay and there were no complications.     Disposition: Discharge disposition: 01-Home or Self Care      Discharge Instructions    Call MD / Call 911   Complete by: As directed    If you experience chest pain or shortness of breath, CALL 911 and be transported to the hospital emergency room.  If you develope a fever above 101 F, pus (white drainage) or increased drainage or redness at the wound, or calf pain, call your surgeon's office.   Call MD / Call 911   Complete by: As directed    If you experience chest pain or shortness of breath, CALL 911 and be transported to the hospital emergency room.  If you develope a fever above 101 F, pus (white drainage) or increased drainage or redness at the wound, or calf pain, call your surgeon's office.   Constipation Prevention   Complete by: As directed    Drink plenty of fluids.  Prune juice may be helpful.   You may use a stool softener, such as Colace (over the counter) 100 mg twice a day.  Use MiraLax (over the counter) for constipation as needed.   Constipation Prevention   Complete by: As directed    Drink plenty of fluids.  Prune juice may be helpful.  You may use a stool softener, such as Colace (over the counter) 100 mg twice a day.  Use MiraLax (over the counter) for constipation as needed.   Diet - low sodium heart healthy   Complete by: As directed    Diet - low sodium heart healthy   Complete by: As directed    Discharge instructions   Complete by: As directed    Show patient how to attach prevena vac. Should call office if beeps   Increase activity slowly as tolerated   Complete by: As directed    Increase activity slowly as tolerated   Complete by: As directed    Negative Pressure Wound Therapy - Incisional   Complete by: As directed    Show patient how to attach prevena vac   Non weight bearing   Complete by: As directed       Follow-up Information    Newt Minion, MD In 1 week.   Specialty: Orthopedic Surgery Contact information: [address] [place] [address] [phone]                Signed: Bevely Palmer Montrell Cessna 06/07/2020, 7:27 AM

## 2020-06-07 NOTE — Progress Notes (Signed)
Patient is status post ankle fusion.  She is 6 days out.  Wound VAC is only functioning fair and has 0 cc in the canister.  She is insisting on being discharged today.  She does have some nausea and I recommended that she stay and have her dialysis but she says she cannot.  She does have a regular dialysis appointment tomorrow.  Wound VAC was removed and dry dressing was placed.  Instructions were given to her to change daily and cleanse with antibacterial soap and water.  Continue to be nonweightbearing and wear her boot.  She is to follow-up with [place] in 1 week

## 2020-06-08 ENCOUNTER — Telehealth: Payer: Self-pay

## 2020-06-08 DIAGNOSIS — N186 End stage renal disease: Secondary | ICD-10-CM | POA: Diagnosis not present

## 2020-06-08 DIAGNOSIS — Z992 Dependence on renal dialysis: Secondary | ICD-10-CM | POA: Diagnosis not present

## 2020-06-08 NOTE — Telephone Encounter (Signed)
Transition Care Management Unsuccessful Follow-up Telephone Call  Date of discharge and from where:  [name]  Attempts:  1st Attempt  Reason for unsuccessful TCM follow-up call:  Left voice message

## 2020-06-08 NOTE — Telephone Encounter (Signed)
This patient needs a TOC appt please.  Call is complete although unable to reach patient. She was discharged 06/07/20

## 2020-06-08 NOTE — Telephone Encounter (Signed)
Transition Care Management Unsuccessful Follow-up Telephone Call  Date of discharge and from where:  [name]  Attempts:  2nd Attempt  Reason for unsuccessful TCM follow-up call:  Left voice message

## 2020-06-09 ENCOUNTER — Ambulatory Visit (INDEPENDENT_AMBULATORY_CARE_PROVIDER_SITE_OTHER): Payer: Medicaid Other | Admitting: Physician Assistant

## 2020-06-09 ENCOUNTER — Other Ambulatory Visit: Payer: Self-pay

## 2020-06-09 ENCOUNTER — Ambulatory Visit (HOSPITAL_COMMUNITY)
Admission: RE | Admit: 2020-06-09 | Discharge: 2020-06-09 | Disposition: A | Discharge status: Home / Self Care | Payer: Medicaid Other | Source: Ambulatory Visit | Attending: Physician Assistant | Admitting: Physician Assistant

## 2020-06-09 VITALS — BP 83/54 | HR 86 | Temp 98.1°F | Ht 63.0 in | Wt 135.8 lb

## 2020-06-09 DIAGNOSIS — Z992 Dependence on renal dialysis: Secondary | ICD-10-CM | POA: Diagnosis not present

## 2020-06-09 DIAGNOSIS — N186 End stage renal disease: Secondary | ICD-10-CM

## 2020-06-09 MED ORDER — PREDNISONE 50 MG PO TABS: ORAL_TABLET | ORAL | 0 refills | Status: DC

## 2020-06-09 MED ORDER — DIPHENHYDRAMINE HCL 50 MG PO CAPS: ORAL_CAPSULE | ORAL | 0 refills | Status: DC

## 2020-06-09 NOTE — Progress Notes (Signed)
HISTORY AND PHYSICAL     CC:  dialysis access Requesting Provider:  Perlie Mayo, NP  HPI: This is a 49 y.o. female here for evaluation of her hemodialysis access.  She had a left BC AVF created by Dr. Scot Dock on 08/17/2019.   She subsequently underwent drug coated balloon angioplasty of the left cephalic vein in July 7096 by Dr. Trula Slade.  On 03/17/2020, she underwent left upper arm cephalic vein angioplasty near cephalic arch by Dr. Carlis Abbott and then venoplasty of left upper arm cephalic vein also by Dr. Carlis Abbott on 05/05/2020.  Pt returns today for follow up.    She has a tunneled dialysis catheter that was placed by IR on 08/11/2019.   She comes in today for evaluation of her fistula after fistulogram.  She denies any pain or numbnessin her left hand.  She states her catheter is working well.   She recently broke her foot in 4 places and had surgery by Dr. Sharol Given on 11/24.    The pt is right hand dominant.    Pt is on dialysis.   Days of dialysis if applicable:  M/W/F    HD center if applicable:  [facility] in [place]    The pt is on a statin for cholesterol management.  The pt is on a daily aspirin.  Other AC:  Brilinta The pt is on BB for hypertension.  The pt is not diabetic.   Tobacco hx:  former  Past Medical History:  Diagnosis Date  . Acute cystitis without hematuria   . Acute midline thoracic back pain 12/29/2019  . Acute systolic heart failure ([place]) 10/21/2019  . Anemia   . Arthritis   . Cataracts, bilateral    surgery to remove  . CATARACTS, BILATERAL 07/02/2007   Qualifier: Diagnosis of  By: Isla Pence    . Closed fracture of left femur ([place]) 10/28/2018  . Closed fracture of right ankle 11/06/2017  . Diabetes mellitus    type 2  . Emphysematous cystitis 08/26/2018  . Encounter for gynecological examination with Papanicolaou smear of cervix 01/14/2020  . Encounter for screening fecal occult blood testing 01/14/2020  . Encounter for screening for malignant neoplasm of cervix  12/29/2019  . Encounter for screening mammogram for malignant neoplasm of breast 12/29/2019  . ESRD on hemodialysis ([place])    MWF - in [place]  . GERD (gastroesophageal reflux disease)   . Hyperlipidemia   . Hypertension   . Hypokalemia 08/26/2018  . IRREGULAR MENSES 09/14/2009   Qualifier: Diagnosis of  By: Hassell Done FNP, Tori Milks    . Loose stools 11/12/2019  . Normocytic anemia 08/26/2018  . PARONYCHIA, RIGHT GREAT TOE 07/30/2008   Qualifier: Diagnosis of  By: Hassell Done FNP, Tori Milks    . Pressure ulcer 09/10/2019  . Right arm weakness 08/08/2019  . Sprain of left ankle   . STEMI (ST elevation myocardial infarction) ([place]) 10/18/2019  . STEMI involving right coronary artery ([place]) 10/18/2019  . Stroke ([place]) 10/2019  . SVD (spontaneous vaginal delivery)    x 4  . Vaginosis 08/26/2018  . Weakness 09/10/2019  . Weakness of both lower extremities     Past Surgical History:  Procedure Laterality Date  . A/V FISTULAGRAM Left 01/12/2020   Procedure: A/V FISTULAGRAM;  Surgeon: Serafina Mitchell, MD;  Location: [place] [facility];  Service: Cardiovascular;  Laterality: Left;  . A/V FISTULAGRAM N/A 03/17/2020   Procedure: A/V FISTULAGRAM - Left Arm;  Surgeon: Marty Heck, MD;  Location: [place]  CV [facility];  Service: Cardiovascular;  Laterality: N/A;  . ANKLE FUSION Left 06/01/2020   Procedure: LEFT ANKLE FUSION;  Surgeon: Newt Minion, MD;  Location: [place];  Service: Orthopedics;  Laterality: Left;  . AV FISTULA PLACEMENT Left 08/17/2019   Procedure: LEFT BRACHIAL CEPHALIC ARTERIOVENOUS (AV) FISTULA;  Surgeon: Angelia Mould, MD;  Location: [facility];  Service: Vascular;  Laterality: Left;  . CORONARY STENT INTERVENTION N/A 10/18/2019   Procedure: CORONARY STENT INTERVENTION;  Surgeon: Sherren Mocha, MD;  Location: [place] [facility];  Service: Cardiovascular;  Laterality: N/A;  . CORONARY/GRAFT ACUTE MI REVASCULARIZATION N/A 10/18/2019   Procedure: Coronary/Graft Acute MI Revascularization;   Surgeon: Sherren Mocha, MD;  Location: [place] [facility];  Service: Cardiovascular;  Laterality: N/A;  . EYE SURGERY Bilateral    cataracts removed  . FEMUR IM NAIL Left 10/28/2018   Procedure: RETROGRADE FEMORAL NAILING;  Surgeon: Meredith Pel, MD;  Location: [facility];  Service: Orthopedics;  Laterality: Left;  . IM NAILING FEMORAL SHAFT RETROGRADE Left 10/28/2018  . INTRAVASCULAR ULTRASOUND/IVUS N/A 10/18/2019   Procedure: Intravascular Ultrasound/IVUS;  Surgeon: Sherren Mocha, MD;  Location: [facility] [facility];  Service: Cardiovascular;  Laterality: N/A;  . IR FLUORO GUIDE CV LINE RIGHT  08/11/2019  . IR THORACENTESIS ASP PLEURAL SPACE W/IMG GUIDE  11/12/2019  . IR US GUIDE VASC ACCESS RIGHT  08/11/2019  . KNEE SURGERY Left   . LEFT HEART CATH AND CORONARY ANGIOGRAPHY N/A 10/18/2019   Procedure: LEFT HEART CATH AND CORONARY ANGIOGRAPHY;  Surgeon: Sherren Mocha, MD;  Location: [place] [facility];  Service: Cardiovascular;  Laterality: N/A;  . PERIPHERAL VASCULAR BALLOON ANGIOPLASTY Left 01/12/2020   Procedure: PERIPHERAL VASCULAR BALLOON ANGIOPLASTY;  Surgeon: Serafina Mitchell, MD;  Location: [facility] [facility];  Service: Cardiovascular;  Laterality: Left;  AVF  . PERIPHERAL VASCULAR BALLOON ANGIOPLASTY Left 03/17/2020   Procedure: PERIPHERAL VASCULAR BALLOON ANGIOPLASTY;  Surgeon: Marty Heck, MD;  Location: [place];  Service: Cardiovascular;  Laterality: Left;  AVF  . PERIPHERAL VASCULAR BALLOON ANGIOPLASTY Left 05/05/2020   Procedure: PERIPHERAL VASCULAR BALLOON ANGIOPLASTY;  Surgeon: Marty Heck, MD;  Location: [place] [facility];  Service: Cardiovascular;  Laterality: Left;  arm fistula  . RADIOLOGY WITH ANESTHESIA N/A 09/15/2019   Procedure: [facility] AND LUMBER LOWER BACK PAIN;  Surgeon: Radiologist, Medication, MD;  Location: [place];  Service: Radiology;  Laterality: N/A;  . TUBAL LIGATION      Allergies  Allergen Reactions  . Contrast Media [Iodinated  Diagnostic Agents] Nausea And Vomiting    Treated with Benadryl & Solumedrol  . Penicillins Other (See Comments)    Don't want to take PCN due to family history     Current Outpatient Medications  Medication Sig Dispense Refill  . aspirin 81 MG EC tablet Take 1 tablet (81 mg total) by mouth daily. 90 tablet 3  . atorvastatin (LIPITOR) 80 MG tablet Take 1 tablet (80 mg total) by mouth daily at 6 PM. 90 tablet 3  . carvedilol (COREG) 6.25 MG tablet Take 1.5 tablets (9.375 mg total) by mouth 2 (two) times daily. 180 tablet 3  . epoetin alfa (EPOGEN) 3000 UNIT/ML injection Inject 3,000 Units into the skin every 14 (fourteen) days.     Marland Kitchen gabapentin (NEURONTIN) 300 MG capsule Take 1 capsule (300 mg total) by mouth at bedtime. 90 capsule 0  . hydrALAZINE (APRESOLINE) 10 MG tablet Take 1 tablet (10 mg total) by mouth in the  morning and at bedtime. 180 tablet 3  . insulin aspart protamine- aspart (NOVOLOG MIX 70/30) (70-30) 100 UNIT/ML injection Inject 0.1 mLs (10 Units total) into the skin 2 (two) times daily with a meal. 10 mL 3  . Insulin Syringes, Disposable, U-100 1 ML MISC Use to inject insulin twice daily dx e11.65 100 each 5  . isosorbide mononitrate (IMDUR) 30 MG 24 hr tablet Take 0.5 tablets (15 mg total) by mouth daily. 15 tablet 11  . levothyroxine (SYNTHROID) 25 MCG tablet Take 1 tablet (25 mcg total) by mouth daily before breakfast. 30 tablet 1  . multivitamin (RENA-VIT) TABS tablet Take 1 tablet by mouth at bedtime. 30 tablet 0  . nitroGLYCERIN (NITROSTAT) 0.4 MG SL tablet Place 1 tablet (0.4 mg total) under the tongue every 5 (five) minutes x 3 doses as needed for chest pain. (Patient taking differently: Place 0.4 mg under the tongue every 5 (five) minutes as needed for chest pain (max 3 doses). ) 25 tablet 3  . ondansetron (ZOFRAN ODT) 4 MG disintegrating tablet 4mg  ODT q4 hours prn nausea/vomit (Patient taking differently: Take 4 mg by mouth every 4 (four) hours as needed for nausea or  vomiting. ) 12 tablet 0  . oxyCODONE-acetaminophen (PERCOCET) 5-325 MG tablet Take 1 tablet by mouth every 4 (four) hours as needed. 30 tablet 0  . sertraline (ZOLOFT) 25 MG tablet Take 1 tablet (25 mg total) by mouth daily. 30 tablet 0  . tamsulosin (FLOMAX) 0.4 MG CAPS capsule Take 1 capsule (0.4 mg total) by mouth daily after supper. 90 capsule 0  . ticagrelor (BRILINTA) 90 MG TABS tablet Take 1 tablet (90 mg total) by mouth 2 (two) times daily. 180 tablet 3   Current Facility-Administered Medications  Medication Dose Route Frequency Provider Last Rate Last Admin  . 0.9 %  sodium chloride infusion  250 mL Intravenous PRN Marty Heck, MD      . 0.9 %  sodium chloride infusion  250 mL Intravenous PRN Marty Heck, MD      . sodium chloride flush (NS) 0.9 % injection 3 mL  3 mL Intravenous Q12H Marty Heck, MD      . sodium chloride flush (NS) 0.9 % injection 3 mL  3 mL Intravenous PRN Marty Heck, MD        Family History  Problem Relation Age of Onset  . Diabetes Mother   . Hypertension Mother   . Bipolar disorder Mother   . Brain cancer Maternal Aunt   . Heart disease Maternal Grandmother   . Diabetes Maternal Grandmother   . Breast cancer Maternal Aunt   . Depression Father        Committed suicide  . Diabetes Sister     Social History   Socioeconomic History  . Marital status: Widowed    Spouse name: Not on file  . Number of children: 4  . Years of education: Not on file  . Highest education level: 10th grade  Occupational History  . Not on file  Tobacco Use  . Smoking status: Former Smoker    Packs/day: 0.25    Years: 2.00    Pack years: 0.50    Types: Cigarettes    Quit date: 1997    Years since quitting: 24.9  . Smokeless tobacco: Never Used  Vaping Use  . Vaping Use: Never used  Substance and Sexual Activity  . Alcohol use: No  . Drug use: No  . Sexual activity:  Yes    Birth control/protection: Surgical    Comment:  tubal   Other Topics Concern  . Not on file  Social History Narrative   Lives with friend with Joelene Millin   Cats: [place], [place], [place], another kitten      Enjoy: watching TV-[employer], [employer], and [employer]      Diet: eats all food groups: focuses on Dm and Kidney friendly foods    Caffeine: none   Water: Fluid Restriction 32 ounces a day          Wears seat belt: not currently driving    Oceanographer at home    No weapons    Social Determinants of Health   Financial Resource Strain: Low Risk   . Difficulty of Paying Living Expenses: Not hard at all  Food Insecurity: No Food Insecurity  . Worried About Charity fundraiser in the Last Year: Never true  . Ran Out of Food in the Last Year: Never true  Transportation Needs: No Transportation Needs  . Lack of Transportation (Medical): No  . Lack of Transportation (Non-Medical): No  Physical Activity: Inactive  . Days of Exercise per Week: 0 days  . Minutes of Exercise per Session: 0 min  Stress: No Stress Concern Present  . Feeling of Stress : Not at all  Social Connections: Socially Isolated  . Frequency of Communication with Friends and Family: Never  . Frequency of Social Gatherings with Friends and Family: Never  . Attends Religious Services: Never  . Active Member of Clubs or Organizations: No  . Attends Archivist Meetings: Never  . Marital Status: Widowed  Intimate Partner Violence: Not At Risk  . Fear of Current or Ex-Partner: No  . Emotionally Abused: No  . Physically Abused: No  . Sexually Abused: No     ROS:  See HPI   PHYSICAL EXAMINATION:  Today's Vitals   06/09/20 0934  BP: (!) 83/54  Pulse: 86  Temp: 98.1 F (36.7 C)  TempSrc: Skin  SpO2: 100%  Weight: 135 lb 12.8 oz (61.6 kg)  Height: 5\' 3"  (1.6 m)  PainSc: 9    Body mass index is 24.06 kg/m.    General:  WDWN female in NAD Gait: Not observed HENT: WNL Pulmonary: normal non-labored breathing , without Rales, rhonchi,   wheezing Cardiac: regular, without  Murmur Skin: without rashes, without ulcers  Vascular Exam/Pulses:  2+ left radial pulse Extremities:  + thrill in fistula and mildly pulsatile.  Left hand is warm with motor and sensory in tact.  Musculoskeletal: left foot in boot s/p left ankle surgery  Neurologic: A&O X 3; Speech is fluent/normal  Non-Invasive Vascular Imaging:   Dialysis duplex on 06/09/2020 Elevated velocity of the outflow vein at the shoulder of 508cm/s   ASSESSMENT/PLAN: 49 y.o. female with ESRD here for evaluation of her hemodialysis access and is s/p left BC AVF created by Dr. Scot Dock on 08/17/2019.   She subsequently underwent drug coated balloon angioplasty of the left cephalic vein in July 0272 by Dr. Trula Slade.  On 03/17/2020, she underwent left upper arm cephalic vein angioplasty near cephalic arch by Dr. Carlis Abbott and then venoplasty of left upper arm cephalic vein also by Dr. Carlis Abbott on 05/05/2020  -discussed pt and duplex with Dr. Oneida Alar.  Will set up for fistulogram for possible angioplasty vs surgical repair vs new access given recurrence of stenosis of cephalic vein.   Discussed with pt and she is in agreement with plan. -pt is on  dialysis -pt is right hand dominant -pt is on [place], [facility] [phone]  Clinic MD:   Oneida Alar

## 2020-06-09 NOTE — Telephone Encounter (Signed)
LVM for pt to call the office regarding appt  

## 2020-06-10 ENCOUNTER — Encounter: Payer: Self-pay | Admitting: *Deleted

## 2020-06-14 ENCOUNTER — Ambulatory Visit (INDEPENDENT_AMBULATORY_CARE_PROVIDER_SITE_OTHER): Payer: Medicaid Other | Admitting: Orthopedic Surgery

## 2020-06-14 ENCOUNTER — Ambulatory Visit (INDEPENDENT_AMBULATORY_CARE_PROVIDER_SITE_OTHER): Payer: Medicaid Other

## 2020-06-14 ENCOUNTER — Encounter: Payer: Self-pay | Admitting: Physician Assistant

## 2020-06-14 ENCOUNTER — Other Ambulatory Visit (HOSPITAL_COMMUNITY): Payer: Medicaid Other

## 2020-06-14 VITALS — Ht 63.0 in | Wt 145.0 lb

## 2020-06-14 DIAGNOSIS — S82852A Displaced trimalleolar fracture of left lower leg, initial encounter for closed fracture: Secondary | ICD-10-CM

## 2020-06-14 DIAGNOSIS — S82872A Displaced pilon fracture of left tibia, initial encounter for closed fracture: Secondary | ICD-10-CM

## 2020-06-14 DIAGNOSIS — M25571 Pain in right ankle and joints of right foot: Secondary | ICD-10-CM

## 2020-06-14 MED ORDER — DOXYCYCLINE HYCLATE 100 MG PO TABS: 100.0000 mg | ORAL_TABLET | Freq: Two times a day (BID) | ORAL | 0 refills | Status: DC

## 2020-06-14 MED ORDER — OXYCODONE-ACETAMINOPHEN 5-325 MG PO TABS
1.0000 | ORAL_TABLET | ORAL | 0 refills | Status: DC | PRN
Start: 2020-06-14 — End: 2020-06-21

## 2020-06-14 NOTE — Progress Notes (Signed)
Office Visit Note   Patient: Melanie Hall           Date of Birth: 07-06-1971           MRN: [id number] Visit Date: 06/14/2020              Requested by: Perlie Mayo, NP [address] [place],  [place] [address] PCP: Perlie Mayo, NP  Chief Complaint  Patient presents with  . Left Ankle - Routine Post Op      HPI: Patient is a 49 year old woman who presents 1 week status post left ankle fusion for trimalleolar pilon fracture of the left ankle.  Patient has been full weightbearing.  She states she has just stop smoking.  Assessment & Plan: Visit Diagnoses:  1. Pain in right ankle and joints of right foot   2. Closed pilon fracture, left, initial encounter   3. Trimalleolar fracture of ankle, closed, left, initial encounter     Plan: We will have her start with daily dressing changes with Dial soap cleansing dry dressing plus an Ace wrap discussed the importance of elevation the importance of wearing the fracture boot at all times and the importance of nonweightbearing.  Discussed that if there is further collapse foot salvage may not be an option.  Will call in a prescription for doxycycline and Percocet.  Recommended protein supplements.  Follow-Up Instructions: Return in about 1 week (around 06/21/2020).   Ortho Exam  Patient is alert, oriented, no adenopathy, well-dressed, normal affect, normal respiratory effort. Examination the incision is well approximated there is no cellulitis no drainage no signs of ischemic changes or infection.  Her foot is in varus.  Imaging: XR Ankle Complete Left  Result Date: 06/14/2020 Three-view radiographs of the left ankle shows collapse of the hardware with varus alignment of the fusion site.  No images are attached to the encounter.  Labs: Lab Results  Component Value Date   HGBA1C 8.7 (H) 06/02/2020   HGBA1C 7.6 (H) 10/18/2019   HGBA1C 7.2 (H) 09/10/2019   ESRSEDRATE 45 (H) 09/10/2019   CRP 5.0 (H) 09/10/2019   LABURIC 5.7  05/27/2019   REPTSTATUS 11/13/2019 FINAL 11/12/2019   REPTSTATUS 11/17/2019 FINAL 11/12/2019   GRAMSTAIN  11/12/2019    WBC PRESENT,BOTH PMN AND MONONUCLEAR NO ORGANISMS SEEN CYTOSPIN SMEAR Performed at [facility], [place] [address] [place], [place] [address]    CULT  11/12/2019    NO GROWTH 5 DAYS Performed at [facility] [address] [place], [place] [address]    LABORGA ESCHERICHIA COLI (A) 08/26/2018     Lab Results  Component Value Date   ALBUMIN 2.4 (L) 06/03/2020   ALBUMIN 3.9 12/15/2019   ALBUMIN 2.5 (L) 10/19/2019   LABURIC 5.7 05/27/2019    Lab Results  Component Value Date   MG 1.8 08/24/2019   MG 1.7 08/23/2019   MG 1.7 08/09/2019   Lab Results  Component Value Date   VD25OH 8.82 (L) 05/27/2019    No results found for: PREALBUMIN CBC EXTENDED Latest Ref Rng & Units 06/03/2020 06/01/2020 05/05/2020  WBC 4.0 - 10.5 K/uL 7.0 - -  RBC 3.87 - 5.11 MIL/uL 2.05(L) - -  HGB 12.0 - 15.0 g/dL 6.4(LL) 9.9(L) 8.5(L)  HCT 36 - 46 % 20.4(L) 29.0(L) 25.0(L)  PLT 150 - 400 K/uL 184 - -  NEUTROABS 1.7 - 7.7 K/uL - - -  LYMPHSABS 0.7 - 4.0 K/uL - - -  Body mass index is 25.69 kg/m.  Orders:  Orders Placed This Encounter  Procedures  . XR Ankle Complete Left   No orders of the defined types were placed in this encounter.    Procedures: No procedures performed  Clinical Data: No additional findings.  ROS:  All other systems negative, except as noted in the HPI. Review of Systems  Objective: Vital Signs: Ht 5\' 3"  (1.6 m)   Wt 145 lb (65.8 kg)   LMP 12/17/2016   BMI 25.69 kg/m   Specialty Comments:  No specialty comments available.  PMFS History: Patient Active Problem List   Diagnosis Date Noted  . S/P ankle fusion 06/01/2020  . Pilon fracture of left tibia, closed, initial encounter   . Displaced trimalleolar fracture of left lower leg, initial encounter for closed fracture   . Abnormality of gait and mobility  05/26/2020  . Migraine 05/26/2020  . Diabetic retinopathy ([facility]) 05/04/2020  . Chronic diarrhea of unknown origin 01/14/2020  . Type 2 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, with long-term current use of insulin ([place]) 12/29/2019  . Hypothyroidism 12/29/2019  . Weakness of both lower extremities 11/12/2019  . Debility 09/10/2019  . ESRD (end stage renal disease) on dialysis ([place]) 09/10/2019  . Depression 09/10/2019  . Incontinence in female 09/10/2019  . Anemia in chronic kidney disease 08/19/2019  . MGUS (monoclonal gammopathy of unknown significance) 07/23/2019  . GERD (gastroesophageal reflux disease) 02/28/2019  . Essential hypertension 10/28/2018  . Irregular menses 09/14/2009  . Hypercholesterolemia 11/19/2008  . Type 2 diabetes with nephropathy ([place]) 07/02/2007   Past Medical History:  Diagnosis Date  . Acute cystitis without hematuria   . Acute midline thoracic back pain 12/29/2019  . Acute systolic heart failure ([place]) 10/21/2019  . Anemia   . Arthritis   . Cataracts, bilateral    surgery to remove  . CATARACTS, BILATERAL 07/02/2007   Qualifier: Diagnosis of  By: Isla Pence    . Closed fracture of left femur ([place]) 10/28/2018  . Closed fracture of right ankle 11/06/2017  . Diabetes mellitus    type 2  . Emphysematous cystitis 08/26/2018  . Encounter for gynecological examination with Papanicolaou smear of cervix 01/14/2020  . Encounter for screening fecal occult blood testing 01/14/2020  . Encounter for screening for malignant neoplasm of cervix 12/29/2019  . Encounter for screening mammogram for malignant neoplasm of breast 12/29/2019  . ESRD on hemodialysis ([facility] [facility])    MWF - in [place]  . GERD (gastroesophageal reflux disease)   . Hyperlipidemia   . Hypertension   . Hypokalemia 08/26/2018  . IRREGULAR MENSES 09/14/2009   Qualifier: Diagnosis of  By: Hassell Done FNP, Tori Milks    . Loose stools 11/12/2019  . Normocytic anemia 08/26/2018  . PARONYCHIA,  RIGHT GREAT TOE 07/30/2008   Qualifier: Diagnosis of  By: Hassell Done FNP, Tori Milks    . Pressure ulcer 09/10/2019  . Right arm weakness 08/08/2019  . Sprain of left ankle   . STEMI (ST elevation myocardial infarction) ([place]) 10/18/2019  . STEMI involving right coronary artery ([place]) 10/18/2019  . Stroke ([place]) 10/2019  . SVD (spontaneous vaginal delivery)    x 4  . Vaginosis 08/26/2018  . Weakness 09/10/2019  . Weakness of both lower extremities     Family History  Problem Relation Age of Onset  . Diabetes Mother   . Hypertension Mother   . Bipolar disorder Mother   . Brain cancer Maternal Aunt   . Heart disease Maternal  Grandmother   . Diabetes Maternal Grandmother   . Breast cancer Maternal Aunt   . Depression Father        Committed suicide  . Diabetes Sister     Past Surgical History:  Procedure Laterality Date  . A/V FISTULAGRAM Left 01/12/2020   Procedure: A/V FISTULAGRAM;  Surgeon: Serafina Mitchell, MD;  Location: [facility];  Service: Cardiovascular;  Laterality: Left;  . A/V FISTULAGRAM N/A 03/17/2020   Procedure: A/V FISTULAGRAM - Left Arm;  Surgeon: Marty Heck, MD;  Location: [facility];  Service: Cardiovascular;  Laterality: N/A;  . ANKLE FUSION Left 06/01/2020   Procedure: LEFT ANKLE FUSION;  Surgeon: Newt Minion, MD;  Location: [facility];  Service: Orthopedics;  Laterality: Left;  . AV FISTULA PLACEMENT Left 08/17/2019   Procedure: LEFT BRACHIAL CEPHALIC ARTERIOVENOUS (AV) FISTULA;  Surgeon: Angelia Mould, MD;  Location: [facility];  Service: Vascular;  Laterality: Left;  . CORONARY STENT INTERVENTION N/A 10/18/2019   Procedure: CORONARY STENT INTERVENTION;  Surgeon: Sherren Mocha, MD;  Location: [facility];  Service: Cardiovascular;  Laterality: N/A;  . CORONARY/GRAFT ACUTE MI REVASCULARIZATION N/A 10/18/2019   Procedure: Coronary/Graft Acute MI Revascularization;  Surgeon: Sherren Mocha, MD;  Location: [facility];  Service:  Cardiovascular;  Laterality: N/A;  . EYE SURGERY Bilateral    cataracts removed  . FEMUR IM NAIL Left 10/28/2018   Procedure: RETROGRADE FEMORAL NAILING;  Surgeon: Meredith Pel, MD;  Location: [facility];  Service: Orthopedics;  Laterality: Left;  . IM NAILING FEMORAL SHAFT RETROGRADE Left 10/28/2018  . INTRAVASCULAR ULTRASOUND/IVUS N/A 10/18/2019   Procedure: Intravascular Ultrasound/IVUS;  Surgeon: Sherren Mocha, MD;  Location: [facility];  Service: Cardiovascular;  Laterality: N/A;  . IR FLUORO GUIDE CV LINE RIGHT  08/11/2019  . IR THORACENTESIS ASP PLEURAL SPACE W/IMG GUIDE  11/12/2019  . IR US GUIDE VASC ACCESS RIGHT  08/11/2019  . KNEE SURGERY Left   . LEFT HEART CATH AND CORONARY ANGIOGRAPHY N/A 10/18/2019   Procedure: LEFT HEART CATH AND CORONARY ANGIOGRAPHY;  Surgeon: Sherren Mocha, MD;  Location: [facility];  Service: Cardiovascular;  Laterality: N/A;  . PERIPHERAL VASCULAR BALLOON ANGIOPLASTY Left 01/12/2020   Procedure: PERIPHERAL VASCULAR BALLOON ANGIOPLASTY;  Surgeon: Serafina Mitchell, MD;  Location: [facility];  Service: Cardiovascular;  Laterality: Left;  AVF  . PERIPHERAL VASCULAR BALLOON ANGIOPLASTY Left 03/17/2020   Procedure: PERIPHERAL VASCULAR BALLOON ANGIOPLASTY;  Surgeon: Marty Heck, MD;  Location: [facility];  Service: Cardiovascular;  Laterality: Left;  AVF  . PERIPHERAL VASCULAR BALLOON ANGIOPLASTY Left 05/05/2020   Procedure: PERIPHERAL VASCULAR BALLOON ANGIOPLASTY;  Surgeon: Marty Heck, MD;  Location: [facility];  Service: Cardiovascular;  Laterality: Left;  arm fistula  . RADIOLOGY WITH ANESTHESIA N/A 09/15/2019   Procedure: [facility] AND LUMBER LOWER BACK PAIN;  Surgeon: Radiologist, Medication, MD;  Location: [facility];  Service: Radiology;  Laterality: N/A;  . TUBAL LIGATION     Social History   Occupational History  . Not on file  Tobacco Use  . Smoking status: Former Smoker    Packs/day: 0.25    Years: 2.00     Pack years: 0.50    Types: Cigarettes    Quit date: 1997    Years since quitting: 24.9  . Smokeless tobacco: Never Used  Vaping Use  . Vaping Use: Never used  Substance and Sexual Activity  . Alcohol use:  No  . Drug use: No  . Sexual activity: Yes    Birth control/protection: Surgical    Comment: tubal

## 2020-06-15 ENCOUNTER — Telehealth: Payer: Self-pay

## 2020-06-15 NOTE — Telephone Encounter (Signed)
LVM for the pt to call to set up TOC call

## 2020-06-15 NOTE — Telephone Encounter (Signed)
There are 2 notes on this --closing this one

## 2020-06-15 NOTE — Telephone Encounter (Signed)
-----   Message from Marcia Brash, CMA sent at 06/08/2020  3:38 PM EST ----- Regarding: TOC appt Patient needs TOC appt. TOC completed. 2 attempts made/documented in Epic. Unable to reach patient. D/C 06/07/20  Thank you

## 2020-06-16 ENCOUNTER — Ambulatory Visit (INDEPENDENT_AMBULATORY_CARE_PROVIDER_SITE_OTHER): Payer: Medicaid Other | Admitting: Family Medicine

## 2020-06-16 ENCOUNTER — Encounter: Payer: Self-pay | Admitting: Family Medicine

## 2020-06-16 ENCOUNTER — Other Ambulatory Visit: Payer: Self-pay

## 2020-06-16 VITALS — Ht 63.0 in | Wt 135.0 lb

## 2020-06-16 DIAGNOSIS — R112 Nausea with vomiting, unspecified: Secondary | ICD-10-CM

## 2020-06-16 DIAGNOSIS — R269 Unspecified abnormalities of gait and mobility: Secondary | ICD-10-CM

## 2020-06-16 DIAGNOSIS — R1110 Vomiting, unspecified: Secondary | ICD-10-CM | POA: Insufficient documentation

## 2020-06-16 DIAGNOSIS — Z7689 Persons encountering health services in other specified circumstances: Secondary | ICD-10-CM | POA: Insufficient documentation

## 2020-06-16 DIAGNOSIS — Z981 Arthrodesis status: Secondary | ICD-10-CM

## 2020-06-16 MED ORDER — ONDANSETRON 4 MG PO TBDP: 4.0000 mg | ORAL_TABLET | Freq: Three times a day (TID) | ORAL | 0 refills | Status: AC | PRN

## 2020-06-16 NOTE — Assessment & Plan Note (Signed)
Admission notes, labs, and orders reviewed in addition to first note of follow up by Dr Sharol Given I have encouraged her to follow his orders of non wt bearing to help the ankle stabilized and heal.

## 2020-06-16 NOTE — Assessment & Plan Note (Signed)
S/p left ankle fusion. Following by Dr Sharol Given Hardware has moved, she will see him in one week for repeat xrays in hopes the foot will stabilize and will not need more sx.

## 2020-06-16 NOTE — Assessment & Plan Note (Signed)
She is to be non wt bearing I have encouraged her to follow Dr Gavin Potters guidance in hopes to save her foot.

## 2020-06-16 NOTE — Assessment & Plan Note (Signed)
Reports being sick since being discharged, could be from several medications- pain, anbx, and she has hx of post op nausea as well as being ESRD.  Zofran for short term use ordered.

## 2020-06-16 NOTE — Patient Instructions (Signed)
  I appreciate the opportunity to provide you with care for your health and wellness. Today we discussed: recent surgery   Follow up: as scheduled  No labs or referrals today  I hope the nausea gets better for you. Call to let the surgeon know you are not doing well with antibiotic if taking with food does not help.  Please continue to practice social distancing to keep you, your family, and our community safe.  If you must go out, please wear a mask and practice good handwashing.  It was a pleasure to see you and I look forward to continuing to work together on your health and well-being. Please do not hesitate to call the office if you need care or have questions about your care.  Have a wonderful day. With Gratitude, Cherly Beach, DNP, AGNP-BC

## 2020-06-16 NOTE — Progress Notes (Signed)
Virtual Visit via Telephone Note   This visit type was conducted due to national recommendations for restrictions regarding the COVID-19 Pandemic (e.g. social distancing) in an effort to limit this patient's exposure and mitigate transmission in our community.  Due to her co-morbid illnesses, this patient is at least at moderate risk for complications without adequate follow up.  This format is felt to be most appropriate for this patient at this time.  The patient did not have access to video technology/had technical difficulties with video requiring transitioning to audio format only (telephone).  All issues noted in this document were discussed and addressed.  No physical exam could be performed with this format.   Evaluation Performed:  Follow-up visit  Date:  06/16/2020   ID:  Melanie Hall, DOB 1970-11-26, MRN [id number]  Patient Location: Home Provider Location: Office/Clinic   Participants: Nurse for intake and work up; Patient and Provider for Visit and Wrap up  Method of visit: Telephone  Location of Patient: Home Location of Provider: Office Consent was obtain for visit over the telephone. Services rendered by provider: Visit was performed via telephone  I verified that I am speaking with the correct person using two identifiers.  PCP:  Perlie Mayo, NP   Chief Complaint:  TOC   History of Present Illness:    Melanie Hall is a 49 y.o. female with history has listed below. She was admitted on 06/01/2020 with a chief complaint of left ankle pain, with a final diagnosis of Trimalleolar Left Ankle Fracture.  She went to the  operating room on 06/01/2020 and underwent a left ankle fusion. She had IV antibiotics while inpatient. She had good vitals while admitted as well. She was to be non-weight bearing, but once home she reports full weight. However he follow up with Dr Sharol Given showed movement in the hardware. He strongly advised no wt on the foot and started Doxy and  provided pain medication to her. He advised that if the foot continues to collapse the foot might not be salvageable. She is to increase protein and wear the boot at all times to stabilize. She reports nausea related to antibiotic as well as present since d/c from hospital.   The patient does not have symptoms concerning for COVID-19 infection (fever, chills, cough, or new shortness of breath).   Past Medical, Surgical, Social History, Allergies, and Medications have been Reviewed.  Past Medical History:  Diagnosis Date  . Acute cystitis without hematuria   . Acute midline thoracic back pain 12/29/2019  . Acute systolic heart failure ([place]) 10/21/2019  . Anemia   . Arthritis   . Cataracts, bilateral    surgery to remove  . CATARACTS, BILATERAL 07/02/2007   Qualifier: Diagnosis of  By: Isla Pence    . Closed fracture of left femur ([place]) 10/28/2018  . Closed fracture of right ankle 11/06/2017  . Diabetes mellitus    type 2  . Emphysematous cystitis 08/26/2018  . Encounter for gynecological examination with Papanicolaou smear of cervix 01/14/2020  . Encounter for screening fecal occult blood testing 01/14/2020  . Encounter for screening for malignant neoplasm of cervix 12/29/2019  . Encounter for screening mammogram for malignant neoplasm of breast 12/29/2019  . ESRD on hemodialysis ([place])    MWF - in [place]  . GERD (gastroesophageal reflux disease)   . Hyperlipidemia   . Hypertension   . Hypokalemia 08/26/2018  . IRREGULAR MENSES 09/14/2009   Qualifier: Diagnosis of  By:  Hassell Done FNP, Tori Milks    . Loose stools 11/12/2019  . Normocytic anemia 08/26/2018  . PARONYCHIA, RIGHT GREAT TOE 07/30/2008   Qualifier: Diagnosis of  By: Hassell Done FNP, Tori Milks    . Pressure ulcer 09/10/2019  . Right arm weakness 08/08/2019  . Sprain of left ankle   . STEMI (ST elevation myocardial infarction) ([place]) 10/18/2019  . STEMI involving right coronary artery ([place]) 10/18/2019  . Stroke ([place]) 10/2019  . SVD  (spontaneous vaginal delivery)    x 4  . Vaginosis 08/26/2018  . Weakness 09/10/2019  . Weakness of both lower extremities    Past Surgical History:  Procedure Laterality Date  . A/V FISTULAGRAM Left 01/12/2020   Procedure: A/V FISTULAGRAM;  Surgeon: Serafina Mitchell, MD;  Location: [facility];  Service: Cardiovascular;  Laterality: Left;  . A/V FISTULAGRAM N/A 03/17/2020   Procedure: A/V FISTULAGRAM - Left Arm;  Surgeon: Marty Heck, MD;  Location: [facility];  Service: Cardiovascular;  Laterality: N/A;  . ANKLE FUSION Left 06/01/2020   Procedure: LEFT ANKLE FUSION;  Surgeon: Newt Minion, MD;  Location: [facility];  Service: Orthopedics;  Laterality: Left;  . AV FISTULA PLACEMENT Left 08/17/2019   Procedure: LEFT BRACHIAL CEPHALIC ARTERIOVENOUS (AV) FISTULA;  Surgeon: Angelia Mould, MD;  Location: [facility];  Service: Vascular;  Laterality: Left;  . CORONARY STENT INTERVENTION N/A 10/18/2019   Procedure: CORONARY STENT INTERVENTION;  Surgeon: Sherren Mocha, MD;  Location: [facility];  Service: Cardiovascular;  Laterality: N/A;  . CORONARY/GRAFT ACUTE MI REVASCULARIZATION N/A 10/18/2019   Procedure: Coronary/Graft Acute MI Revascularization;  Surgeon: Sherren Mocha, MD;  Location: [facility];  Service: Cardiovascular;  Laterality: N/A;  . EYE SURGERY Bilateral    cataracts removed  . FEMUR IM NAIL Left 10/28/2018   Procedure: RETROGRADE FEMORAL NAILING;  Surgeon: Meredith Pel, MD;  Location: [facility];  Service: Orthopedics;  Laterality: Left;  . IM NAILING FEMORAL SHAFT RETROGRADE Left 10/28/2018  . INTRAVASCULAR ULTRASOUND/IVUS N/A 10/18/2019   Procedure: Intravascular Ultrasound/IVUS;  Surgeon: Sherren Mocha, MD;  Location: [facility];  Service: Cardiovascular;  Laterality: N/A;  . IR FLUORO GUIDE CV LINE RIGHT  08/11/2019  . IR THORACENTESIS ASP PLEURAL SPACE W/IMG GUIDE  11/12/2019  . IR US GUIDE VASC ACCESS RIGHT  08/11/2019  . KNEE  SURGERY Left   . LEFT HEART CATH AND CORONARY ANGIOGRAPHY N/A 10/18/2019   Procedure: LEFT HEART CATH AND CORONARY ANGIOGRAPHY;  Surgeon: Sherren Mocha, MD;  Location: [facility];  Service: Cardiovascular;  Laterality: N/A;  . PERIPHERAL VASCULAR BALLOON ANGIOPLASTY Left 01/12/2020   Procedure: PERIPHERAL VASCULAR BALLOON ANGIOPLASTY;  Surgeon: Serafina Mitchell, MD;  Location: [facility];  Service: Cardiovascular;  Laterality: Left;  AVF  . PERIPHERAL VASCULAR BALLOON ANGIOPLASTY Left 03/17/2020   Procedure: PERIPHERAL VASCULAR BALLOON ANGIOPLASTY;  Surgeon: Marty Heck, MD;  Location: [facility];  Service: Cardiovascular;  Laterality: Left;  AVF  . PERIPHERAL VASCULAR BALLOON ANGIOPLASTY Left 05/05/2020   Procedure: PERIPHERAL VASCULAR BALLOON ANGIOPLASTY;  Surgeon: Marty Heck, MD;  Location: [facility];  Service: Cardiovascular;  Laterality: Left;  arm fistula  . RADIOLOGY WITH ANESTHESIA N/A 09/15/2019   Procedure: [facility] AND LUMBER LOWER BACK PAIN;  Surgeon: Radiologist, Medication, MD;  Location: [facility];  Service: Radiology;  Laterality: N/A;  . TUBAL LIGATION       Current Meds  Medication Sig  . aspirin  81 MG EC tablet Take 1 tablet (81 mg total) by mouth daily.  Marland Kitchen atorvastatin (LIPITOR) 80 MG tablet Take 1 tablet (80 mg total) by mouth daily at 6 PM.  . carvedilol (COREG) 6.25 MG tablet Take 1.5 tablets (9.375 mg total) by mouth 2 (two) times daily.  . diphenhydrAMINE (BENADRYL) 50 MG capsule One tablet (50 mg) by mouth 1 hour prior to procedure  . doxycycline (VIBRA-TABS) 100 MG tablet Take 1 tablet (100 mg total) by mouth 2 (two) times daily.  Marland Kitchen epoetin alfa (EPOGEN) 3000 UNIT/ML injection Inject 3,000 Units into the skin every 14 (fourteen) days.   Marland Kitchen gabapentin (NEURONTIN) 300 MG capsule Take 1 capsule (300 mg total) by mouth at bedtime.  . hydrALAZINE (APRESOLINE) 10 MG tablet Take 1 tablet (10 mg total) by mouth in the morning and at  bedtime.  . insulin aspart protamine- aspart (NOVOLOG MIX 70/30) (70-30) 100 UNIT/ML injection Inject 0.1 mLs (10 Units total) into the skin 2 (two) times daily with a meal.  . Insulin Syringes, Disposable, U-100 1 ML MISC Use to inject insulin twice daily dx e11.65  . isosorbide mononitrate (IMDUR) 30 MG 24 hr tablet Take 0.5 tablets (15 mg total) by mouth daily.  Marland Kitchen levothyroxine (SYNTHROID) 25 MCG tablet Take 1 tablet (25 mcg total) by mouth daily before breakfast.  . multivitamin (RENA-VIT) TABS tablet Take 1 tablet by mouth at bedtime.  . nitroGLYCERIN (NITROSTAT) 0.4 MG SL tablet Place 1 tablet (0.4 mg total) under the tongue every 5 (five) minutes x 3 doses as needed for chest pain. (Patient taking differently: Place 0.4 mg under the tongue every 5 (five) minutes as needed for chest pain (max 3 doses).)  . oxyCODONE-acetaminophen (PERCOCET/ROXICET) 5-325 MG tablet Take 1 tablet by mouth every 4 (four) hours as needed for severe pain.  . predniSONE (DELTASONE) 50 MG tablet One tablet (50mg ) 13 hours prior to procedure; one tablet (50mg ) 7 hours prior to procedure and then one tablet (50 mg) one hour prior to procedure.  . tamsulosin (FLOMAX) 0.4 MG CAPS capsule Take 1 capsule (0.4 mg total) by mouth daily after supper.  . ticagrelor (BRILINTA) 90 MG TABS tablet Take 1 tablet (90 mg total) by mouth 2 (two) times daily.  . [DISCONTINUED] ondansetron (ZOFRAN ODT) 4 MG disintegrating tablet 4mg  ODT q4 hours prn nausea/vomit (Patient taking differently: Take 4 mg by mouth every 4 (four) hours as needed for nausea or vomiting.)  . [DISCONTINUED] oxyCODONE-acetaminophen (PERCOCET) 5-325 MG tablet Take 1 tablet by mouth every 4 (four) hours as needed.   Current Facility-Administered Medications for the 06/16/20 encounter (Office Visit) with Perlie Mayo, NP  Medication  . 0.9 %  sodium chloride infusion  . 0.9 %  sodium chloride infusion  . sodium chloride flush (NS) 0.9 % injection 3 mL  . sodium  chloride flush (NS) 0.9 % injection 3 mL     Allergies:   Contrast media [iodinated diagnostic agents] and Penicillins   ROS:   Please see the history of present illness.    All other systems reviewed and are negative.   Labs/Other Tests and Data Reviewed:    Recent Labs: 08/08/2019: B Natriuretic Peptide 279.0 08/24/2019: Magnesium 1.8 12/15/2019: ALT 26 12/29/2019: TSH 4.09 06/03/2020: BUN 37; Creatinine, Ser 4.77; Hemoglobin 6.4; Platelets 184; Potassium 4.4; Sodium 133   Recent Lipid Panel Lab Results  Component Value Date/Time   CHOL 94 (L) 12/15/2019 09:41 AM   TRIG 94 12/15/2019 09:41 AM  HDL 35 (L) 12/15/2019 09:41 AM   CHOLHDL 2.7 12/15/2019 09:41 AM   CHOLHDL 3.4 10/19/2019 12:34 AM   LDLCALC 41 12/15/2019 09:41 AM    Wt Readings from Last 3 Encounters:  06/16/20 135 lb (61.2 kg)  06/14/20 145 lb (65.8 kg)  06/09/20 135 lb 12.8 oz (61.6 kg)     Objective:    Vital Signs:  Ht 5\' 3"  (1.6 m)   Wt 135 lb (61.2 kg)   LMP 12/17/2016   BMI 23.91 kg/m    VITAL SIGNS:  reviewed GEN:  no acute distress RESPIRATORY:  no shortness of breath in conversation  PSYCH:  normal affect   ASSESSMENT & PLAN:    1. Non-intractable vomiting with nausea, unspecified vomiting type  - ondansetron (ZOFRAN ODT) 4 MG disintegrating tablet; Take 1 tablet (4 mg total) by mouth every 8 (eight) hours as needed for nausea or vomiting.  Dispense: 15 tablet; Refill: 0  2. S/P ankle fusion   3. Abnormality of gait and mobility   Time:   Today, I have spent 15 minutes with the patient with telehealth technology discussing the above problems.     Medication Adjustments/Labs and Tests Ordered: Current medicines are reviewed at length with the patient today.  Concerns regarding medicines are outlined above.   Tests Ordered: No orders of the defined types were placed in this encounter.   Medication Changes: Meds ordered this encounter  Medications  . ondansetron (ZOFRAN ODT) 4  MG disintegrating tablet    Sig: Take 1 tablet (4 mg total) by mouth every 8 (eight) hours as needed for nausea or vomiting.    Dispense:  15 tablet    Refill:  0    Order Specific Question:   Supervising Provider    Answer:   Fayrene Helper [[id number]]     Disposition:  Follow up as scheduled  Signed, Perlie Mayo, NP  06/16/2020 4:35 PM     [facility]

## 2020-06-17 ENCOUNTER — Other Ambulatory Visit: Payer: Self-pay | Admitting: Physician Assistant

## 2020-06-17 ENCOUNTER — Telehealth: Payer: Self-pay | Admitting: Physician Assistant

## 2020-06-17 MED ORDER — SULFAMETHOXAZOLE-TRIMETHOPRIM 800-160 MG PO TABS: 1.0000 | ORAL_TABLET | Freq: Two times a day (BID) | ORAL | 0 refills | Status: DC

## 2020-06-17 NOTE — Telephone Encounter (Signed)
Changed to Bactrim. Should also take a probiotic

## 2020-06-17 NOTE — Telephone Encounter (Signed)
This pt was evaluated in the office s/p  ankle fusion. She was placed on Doxy bid and pt states that this is causing GI up set and vomiting. Wants to know if she can have another ABX.

## 2020-06-17 NOTE — Telephone Encounter (Signed)
Patient called advised the antibiotic she is taking is making her sick and making her throw up. Patient asked if something else can be prescribed for her? The number to contact patient is [phone]

## 2020-06-17 NOTE — Telephone Encounter (Signed)
Called and lm on vm to advise of message below.  

## 2020-06-21 ENCOUNTER — Ambulatory Visit: Payer: Self-pay

## 2020-06-21 ENCOUNTER — Encounter: Payer: Self-pay | Admitting: Physician Assistant

## 2020-06-21 ENCOUNTER — Ambulatory Visit (INDEPENDENT_AMBULATORY_CARE_PROVIDER_SITE_OTHER): Payer: Medicaid Other

## 2020-06-21 ENCOUNTER — Other Ambulatory Visit (HOSPITAL_COMMUNITY)
Admission: RE | Admit: 2020-06-21 | Discharge: 2020-06-21 | Disposition: A | Discharge status: Home / Self Care | Payer: Medicaid Other | Source: Ambulatory Visit | Attending: Vascular Surgery | Admitting: Vascular Surgery

## 2020-06-21 ENCOUNTER — Ambulatory Visit (INDEPENDENT_AMBULATORY_CARE_PROVIDER_SITE_OTHER): Payer: Medicaid Other | Admitting: Physician Assistant

## 2020-06-21 VITALS — Ht 63.0 in | Wt 135.0 lb

## 2020-06-21 DIAGNOSIS — Z01812 Encounter for preprocedural laboratory examination: Secondary | ICD-10-CM | POA: Diagnosis not present

## 2020-06-21 DIAGNOSIS — Z20822 Contact with and (suspected) exposure to covid-19: Secondary | ICD-10-CM | POA: Diagnosis not present

## 2020-06-21 DIAGNOSIS — M25571 Pain in right ankle and joints of right foot: Secondary | ICD-10-CM

## 2020-06-21 LAB — SARS CORONAVIRUS 2 (TAT 6-24 HRS): SARS Coronavirus 2: NEGATIVE

## 2020-06-21 MED ORDER — OXYCODONE-ACETAMINOPHEN 5-325 MG PO TABS: 1.0000 | ORAL_TABLET | ORAL | 0 refills | Status: DC | PRN

## 2020-06-21 NOTE — Progress Notes (Signed)
Office Visit Note   Patient: Melanie Hall           Date of Birth: March 08, 1971           MRN: [id number] Visit Date: 06/21/2020              Requested by: Perlie Mayo, NP [address] [place],  [place] [address] PCP: Perlie Mayo, NP  Chief Complaint  Patient presents with  . Left Ankle - Routine Post Op    06/01/20 left ankle fusion       HPI: Patient presents today 3 weeks status post tibial calcaneal fusion.  She still continues to put weight on her ankle but says she is gotten better about it.  She has been having someone change her dressing and applying hydrogen peroxide  Assessment & Plan: Visit Diagnoses:  1. Pain in right ankle and joints of right foot     Plan: Patient will go into a 3 layer wrap with silver alginate dressing follow-up on Friday for changing of the wrap as well as next Tuesday  Follow-Up Instructions: No follow-ups on file.   Ortho Exam  Patient is alert, oriented, no adenopathy, well-dressed, normal affect, normal respiratory effort. Moderate soft tissue swelling through the ankle.  She does have some wound dehiscence through the central portion no surrounding cellulitis.  No necrosis no foul odor compartments are soft and nontender  Imaging: No results found. No images are attached to the encounter.  Labs: Lab Results  Component Value Date   HGBA1C 8.7 (H) 06/02/2020   HGBA1C 7.6 (H) 10/18/2019   HGBA1C 7.2 (H) 09/10/2019   ESRSEDRATE 45 (H) 09/10/2019   CRP 5.0 (H) 09/10/2019   LABURIC 5.7 05/27/2019   REPTSTATUS 11/13/2019 FINAL 11/12/2019   REPTSTATUS 11/17/2019 FINAL 11/12/2019   GRAMSTAIN  11/12/2019    WBC PRESENT,BOTH PMN AND MONONUCLEAR NO ORGANISMS SEEN CYTOSPIN SMEAR Performed at [facility], [place] [address], [place], [place] [address]    CULT  11/12/2019    NO GROWTH 5 DAYS Performed at [facility] [address], [place], [place] [address]    LABORGA ESCHERICHIA COLI (A) 08/26/2018     Lab  Results  Component Value Date   ALBUMIN 2.4 (L) 06/03/2020   ALBUMIN 3.9 12/15/2019   ALBUMIN 2.5 (L) 10/19/2019   LABURIC 5.7 05/27/2019    Lab Results  Component Value Date   MG 1.8 08/24/2019   MG 1.7 08/23/2019   MG 1.7 08/09/2019   Lab Results  Component Value Date   VD25OH 8.82 (L) 05/27/2019    No results found for: PREALBUMIN CBC EXTENDED Latest Ref Rng & Units 06/03/2020 06/01/2020 05/05/2020  WBC 4.0 - 10.5 K/uL 7.0 - -  RBC 3.87 - 5.11 MIL/uL 2.05(L) - -  HGB 12.0 - 15.0 g/dL 6.4(LL) 9.9(L) 8.5(L)  HCT 36.0 - 46.0 % 20.4(L) 29.0(L) 25.0(L)  PLT 150 - 400 K/uL 184 - -  NEUTROABS 1.7 - 7.7 K/uL - - -  LYMPHSABS 0.7 - 4.0 K/uL - - -     Body mass index is 23.91 kg/m.  Orders:  Orders Placed This Encounter  Procedures  . XR Ankle Complete Left   No orders of the defined types were placed in this encounter.    Procedures: No procedures performed  Clinical Data: No additional findings.  ROS:  All other systems negative, except as noted in the HPI. Review of Systems  Objective: Vital Signs: Ht  5\' 3"  (1.6 m)   Wt 135 lb (61.2 kg)   LMP 12/17/2016   BMI 23.91 kg/m   Specialty Comments:  No specialty comments available.  PMFS History: Patient Active Problem List   Diagnosis Date Noted  . Non-intractable vomiting 06/16/2020  . Encounter for support and coordination of transition of care 06/16/2020  . S/P ankle fusion 06/01/2020  . Pilon fracture of left tibia, closed, initial encounter   . Displaced trimalleolar fracture of left lower leg, initial encounter for closed fracture   . Abnormality of gait and mobility 05/26/2020  . Migraine 05/26/2020  . Diabetic retinopathy ([place]) 05/04/2020  . Chronic diarrhea of unknown origin 01/14/2020  . Type 2 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, with long-term current use of insulin ([place]) 12/29/2019  . Hypothyroidism 12/29/2019  . Weakness of both lower extremities 11/12/2019   . Debility 09/10/2019  . ESRD (end stage renal disease) on dialysis ([place]) 09/10/2019  . Depression 09/10/2019  . Incontinence in female 09/10/2019  . Anemia in chronic kidney disease 08/19/2019  . MGUS (monoclonal gammopathy of unknown significance) 07/23/2019  . GERD (gastroesophageal reflux disease) 02/28/2019  . Essential hypertension 10/28/2018  . Irregular menses 09/14/2009  . Hypercholesterolemia 11/19/2008  . Type 2 diabetes with nephropathy ([place]) 07/02/2007   Past Medical History:  Diagnosis Date  . Acute cystitis without hematuria   . Acute midline thoracic back pain 12/29/2019  . Acute systolic heart failure ([place]) 10/21/2019  . Anemia   . Arthritis   . Cataracts, bilateral    surgery to remove  . CATARACTS, BILATERAL 07/02/2007   Qualifier: Diagnosis of  By: Isla Pence    . Closed fracture of left femur ([place]) 10/28/2018  . Closed fracture of right ankle 11/06/2017  . Diabetes mellitus    type 2  . Emphysematous cystitis 08/26/2018  . Encounter for gynecological examination with Papanicolaou smear of cervix 01/14/2020  . Encounter for screening fecal occult blood testing 01/14/2020  . Encounter for screening for malignant neoplasm of cervix 12/29/2019  . Encounter for screening mammogram for malignant neoplasm of breast 12/29/2019  . ESRD on hemodialysis ([place])    MWF - in [place]  . GERD (gastroesophageal reflux disease)   . Hyperlipidemia   . Hypertension   . Hypokalemia 08/26/2018  . IRREGULAR MENSES 09/14/2009   Qualifier: Diagnosis of  By: Hassell Done FNP, Tori Milks    . Loose stools 11/12/2019  . Normocytic anemia 08/26/2018  . PARONYCHIA, RIGHT GREAT TOE 07/30/2008   Qualifier: Diagnosis of  By: Hassell Done FNP, Tori Milks    . Pressure ulcer 09/10/2019  . Right arm weakness 08/08/2019  . Sprain of left ankle   . STEMI (ST elevation myocardial infarction) ([place]) 10/18/2019  . STEMI involving right coronary artery ([place]) 10/18/2019  . Stroke ([place]) 10/2019  . SVD (spontaneous  vaginal delivery)    x 4  . Vaginosis 08/26/2018  . Weakness 09/10/2019  . Weakness of both lower extremities     Family History  Problem Relation Age of Onset  . Diabetes Mother   . Hypertension Mother   . Bipolar disorder Mother   . Brain cancer Maternal Aunt   . Heart disease Maternal Grandmother   . Diabetes Maternal Grandmother   . Breast cancer Maternal Aunt   . Depression Father        Committed suicide  . Diabetes Sister     Past Surgical History:  Procedure Laterality Date  . A/V FISTULAGRAM Left 01/12/2020  Procedure: A/V FISTULAGRAM;  Surgeon: Serafina Mitchell, MD;  Location: [facility];  Service: Cardiovascular;  Laterality: Left;  . A/V FISTULAGRAM N/A 03/17/2020   Procedure: A/V FISTULAGRAM - Left Arm;  Surgeon: Marty Heck, MD;  Location: [facility];  Service: Cardiovascular;  Laterality: N/A;  . ANKLE FUSION Left 06/01/2020   Procedure: LEFT ANKLE FUSION;  Surgeon: Newt Minion, MD;  Location: [facility];  Service: Orthopedics;  Laterality: Left;  . AV FISTULA PLACEMENT Left 08/17/2019   Procedure: LEFT BRACHIAL CEPHALIC ARTERIOVENOUS (AV) FISTULA;  Surgeon: Angelia Mould, MD;  Location: [facility];  Service: Vascular;  Laterality: Left;  . CORONARY STENT INTERVENTION N/A 10/18/2019   Procedure: CORONARY STENT INTERVENTION;  Surgeon: Sherren Mocha, MD;  Location: [facility];  Service: Cardiovascular;  Laterality: N/A;  . CORONARY/GRAFT ACUTE MI REVASCULARIZATION N/A 10/18/2019   Procedure: Coronary/Graft Acute MI Revascularization;  Surgeon: Sherren Mocha, MD;  Location: [facility];  Service: Cardiovascular;  Laterality: N/A;  . EYE SURGERY Bilateral    cataracts removed  . FEMUR IM NAIL Left 10/28/2018   Procedure: RETROGRADE FEMORAL NAILING;  Surgeon: Meredith Pel, MD;  Location: [facility];  Service: Orthopedics;  Laterality: Left;  . IM NAILING FEMORAL SHAFT RETROGRADE Left 10/28/2018  . INTRAVASCULAR ULTRASOUND/IVUS N/A  10/18/2019   Procedure: Intravascular Ultrasound/IVUS;  Surgeon: Sherren Mocha, MD;  Location: [facility];  Service: Cardiovascular;  Laterality: N/A;  . IR FLUORO GUIDE CV LINE RIGHT  08/11/2019  . IR THORACENTESIS ASP PLEURAL SPACE W/IMG GUIDE  11/12/2019  . IR US GUIDE VASC ACCESS RIGHT  08/11/2019  . KNEE SURGERY Left   . LEFT HEART CATH AND CORONARY ANGIOGRAPHY N/A 10/18/2019   Procedure: LEFT HEART CATH AND CORONARY ANGIOGRAPHY;  Surgeon: Sherren Mocha, MD;  Location: [facility];  Service: Cardiovascular;  Laterality: N/A;  . PERIPHERAL VASCULAR BALLOON ANGIOPLASTY Left 01/12/2020   Procedure: PERIPHERAL VASCULAR BALLOON ANGIOPLASTY;  Surgeon: Serafina Mitchell, MD;  Location: [facility];  Service: Cardiovascular;  Laterality: Left;  AVF  . PERIPHERAL VASCULAR BALLOON ANGIOPLASTY Left 03/17/2020   Procedure: PERIPHERAL VASCULAR BALLOON ANGIOPLASTY;  Surgeon: Marty Heck, MD;  Location: [facility];  Service: Cardiovascular;  Laterality: Left;  AVF  . PERIPHERAL VASCULAR BALLOON ANGIOPLASTY Left 05/05/2020   Procedure: PERIPHERAL VASCULAR BALLOON ANGIOPLASTY;  Surgeon: Marty Heck, MD;  Location: [facility];  Service: Cardiovascular;  Laterality: Left;  arm fistula  . RADIOLOGY WITH ANESTHESIA N/A 09/15/2019   Procedure: [facility] AND LUMBER LOWER BACK PAIN;  Surgeon: Radiologist, Medication, MD;  Location: [facility];  Service: Radiology;  Laterality: N/A;  . TUBAL LIGATION     Social History   Occupational History  . Not on file  Tobacco Use  . Smoking status: Former Smoker    Packs/day: 0.25    Years: 2.00    Pack years: 0.50    Types: Cigarettes    Quit date: 1997    Years since quitting: 24.9  . Smokeless tobacco: Never Used  Vaping Use  . Vaping Use: Never used  Substance and Sexual Activity  . Alcohol use: No  . Drug use: No  . Sexual activity: Yes    Birth control/protection: Surgical    Comment: tubal

## 2020-06-23 ENCOUNTER — Other Ambulatory Visit: Payer: Self-pay

## 2020-06-23 ENCOUNTER — Ambulatory Visit (INDEPENDENT_AMBULATORY_CARE_PROVIDER_SITE_OTHER): Payer: Medicaid Other

## 2020-06-23 ENCOUNTER — Encounter (HOSPITAL_COMMUNITY)
Admission: RE | Disposition: A | Discharge status: Home / Self Care | Payer: Self-pay | Source: Home / Self Care | Attending: Vascular Surgery

## 2020-06-23 ENCOUNTER — Encounter: Payer: Self-pay | Admitting: Physician Assistant

## 2020-06-23 ENCOUNTER — Ambulatory Visit (INDEPENDENT_AMBULATORY_CARE_PROVIDER_SITE_OTHER): Payer: Medicaid Other | Admitting: Physician Assistant

## 2020-06-23 ENCOUNTER — Ambulatory Visit (HOSPITAL_COMMUNITY)
Admission: RE | Admit: 2020-06-23 | Discharge: 2020-06-23 | Disposition: A | Discharge status: Home / Self Care | Payer: Medicaid Other | Attending: Vascular Surgery | Admitting: Vascular Surgery

## 2020-06-23 DIAGNOSIS — Z794 Long term (current) use of insulin: Secondary | ICD-10-CM | POA: Diagnosis not present

## 2020-06-23 DIAGNOSIS — Z880 Allergy status to penicillin: Secondary | ICD-10-CM | POA: Insufficient documentation

## 2020-06-23 DIAGNOSIS — M25571 Pain in right ankle and joints of right foot: Secondary | ICD-10-CM

## 2020-06-23 DIAGNOSIS — Z87891 Personal history of nicotine dependence: Secondary | ICD-10-CM | POA: Diagnosis not present

## 2020-06-23 DIAGNOSIS — E1122 Type 2 diabetes mellitus with diabetic chronic kidney disease: Secondary | ICD-10-CM | POA: Insufficient documentation

## 2020-06-23 DIAGNOSIS — Z992 Dependence on renal dialysis: Secondary | ICD-10-CM

## 2020-06-23 DIAGNOSIS — Y841 Kidney dialysis as the cause of abnormal reaction of the patient, or of later complication, without mention of misadventure at the time of the procedure: Secondary | ICD-10-CM | POA: Diagnosis not present

## 2020-06-23 DIAGNOSIS — Z79899 Other long term (current) drug therapy: Secondary | ICD-10-CM | POA: Insufficient documentation

## 2020-06-23 DIAGNOSIS — T82510A Breakdown (mechanical) of surgically created arteriovenous fistula, initial encounter: Secondary | ICD-10-CM | POA: Diagnosis not present

## 2020-06-23 DIAGNOSIS — Z888 Allergy status to other drugs, medicaments and biological substances status: Secondary | ICD-10-CM | POA: Insufficient documentation

## 2020-06-23 DIAGNOSIS — Z7982 Long term (current) use of aspirin: Secondary | ICD-10-CM | POA: Insufficient documentation

## 2020-06-23 DIAGNOSIS — S82872A Displaced pilon fracture of left tibia, initial encounter for closed fracture: Secondary | ICD-10-CM

## 2020-06-23 DIAGNOSIS — N186 End stage renal disease: Secondary | ICD-10-CM

## 2020-06-23 DIAGNOSIS — Z79890 Hormone replacement therapy (postmenopausal): Secondary | ICD-10-CM | POA: Insufficient documentation

## 2020-06-23 DIAGNOSIS — I120 Hypertensive chronic kidney disease with stage 5 chronic kidney disease or end stage renal disease: Secondary | ICD-10-CM | POA: Insufficient documentation

## 2020-06-23 HISTORY — PX: A/V FISTULAGRAM: CATH118298

## 2020-06-23 LAB — POCT I-STAT, CHEM 8
BUN: 25 mg/dL — ABNORMAL HIGH (ref 6–20)
Calcium, Ion: 0.93 mmol/L — ABNORMAL LOW (ref 1.15–1.40)
Chloride: 98 mmol/L (ref 98–111)
Creatinine, Ser: 3.5 mg/dL — ABNORMAL HIGH (ref 0.44–1.00)
Glucose, Bld: 331 mg/dL — ABNORMAL HIGH (ref 70–99)
HCT: 29 % — ABNORMAL LOW (ref 36.0–46.0)
Hemoglobin: 9.9 g/dL — ABNORMAL LOW (ref 12.0–15.0)
Potassium: 5.5 mmol/L — ABNORMAL HIGH (ref 3.5–5.1)
Sodium: 131 mmol/L — ABNORMAL LOW (ref 135–145)
TCO2: 25 mmol/L (ref 22–32)

## 2020-06-23 SURGERY — A/V FISTULAGRAM
Anesthesia: LOCAL | Laterality: Left

## 2020-06-23 MED ORDER — DIPHENHYDRAMINE HCL 50 MG/ML IJ SOLN
25.0000 mg | Freq: Once | INTRAMUSCULAR | Status: AC
  Administered 2020-06-23: 10:00:00 25 mg via INTRAVENOUS

## 2020-06-23 MED ORDER — ONDANSETRON HCL 4 MG/2ML IJ SOLN
INTRAMUSCULAR | Status: DC | PRN
  Administered 2020-06-23: 4 mg via INTRAVENOUS

## 2020-06-23 MED ORDER — LIDOCAINE HCL (PF) 1 % IJ SOLN
INTRAMUSCULAR | Status: AC
  Filled 2020-06-23: qty 30

## 2020-06-23 MED ORDER — SODIUM CHLORIDE 0.9% FLUSH: 3.0000 mL | Freq: Two times a day (BID) | INTRAVENOUS | Status: DC

## 2020-06-23 MED ORDER — ONDANSETRON HCL 4 MG/2ML IJ SOLN
INTRAMUSCULAR | Status: AC
  Administered 2020-06-23: 10:00:00 4 mg
  Filled 2020-06-23: qty 2

## 2020-06-23 MED ORDER — SODIUM CHLORIDE 0.9% FLUSH: 3.0000 mL | INTRAVENOUS | Status: DC | PRN

## 2020-06-23 MED ORDER — METHYLPREDNISOLONE SODIUM SUCC 125 MG IJ SOLR
125.0000 mg | Freq: Once | INTRAMUSCULAR | Status: AC
  Administered 2020-06-23: 10:00:00 125 mg via INTRAVENOUS

## 2020-06-23 MED ORDER — HEPARIN (PORCINE) IN NACL 1000-0.9 UT/500ML-% IV SOLN
INTRAVENOUS | Status: DC | PRN
  Administered 2020-06-23: 500 mL

## 2020-06-23 MED ORDER — SODIUM CHLORIDE 0.9 % IV SOLN: 250.0000 mL | INTRAVENOUS | Status: DC | PRN

## 2020-06-23 MED ORDER — ONDANSETRON HCL 4 MG/2ML IJ SOLN
INTRAMUSCULAR | Status: AC
  Filled 2020-06-23: qty 2

## 2020-06-23 MED ORDER — DIPHENHYDRAMINE HCL 50 MG/ML IJ SOLN
INTRAMUSCULAR | Status: AC
  Filled 2020-06-23: qty 1

## 2020-06-23 MED ORDER — HEPARIN (PORCINE) IN NACL 1000-0.9 UT/500ML-% IV SOLN
INTRAVENOUS | Status: AC
  Filled 2020-06-23: qty 500

## 2020-06-23 MED ORDER — LIDOCAINE HCL (PF) 1 % IJ SOLN
INTRAMUSCULAR | Status: DC | PRN
  Administered 2020-06-23: 2 mL via INTRADERMAL

## 2020-06-23 MED ORDER — METHYLPREDNISOLONE SODIUM SUCC 125 MG IJ SOLR
INTRAMUSCULAR | Status: AC
  Filled 2020-06-23: qty 2

## 2020-06-23 MED ORDER — IODIXANOL 320 MG/ML IV SOLN
INTRAVENOUS | Status: DC | PRN
  Administered 2020-06-23: 12:00:00 20 mL

## 2020-06-23 MED ORDER — ONDANSETRON HCL 4 MG/2ML IJ SOLN: 4.0000 mg | Freq: Once | INTRAMUSCULAR | Status: DC

## 2020-06-23 SURGICAL SUPPLY — 9 items
BAG SNAP BAND KOVER 36X36 (MISCELLANEOUS) ×2 IMPLANT
COVER DOME SNAP 22 D (MISCELLANEOUS) ×2 IMPLANT
KIT MICROPUNCTURE NIT STIFF (SHEATH) ×2 IMPLANT
PROTECTION STATION PRESSURIZED (MISCELLANEOUS) ×2
SHEATH PROBE COVER 6X72 (BAG) ×2 IMPLANT
STATION PROTECTION PRESSURIZED (MISCELLANEOUS) ×1 IMPLANT
STOPCOCK MORSE 400PSI 3WAY (MISCELLANEOUS) ×2 IMPLANT
TRAY PV CATH (CUSTOM PROCEDURE TRAY) ×2 IMPLANT
TUBING CIL FLEX 10 FLL-RA (TUBING) ×2 IMPLANT

## 2020-06-23 NOTE — H&P (Signed)
History and Physical Interval Note:  06/23/2020 10:35 AM  [name]  has presented today for surgery, with the diagnosis of end stage renal.  The various methods of treatment have been discussed with the patient and family. After consideration of risks, benefits and other options for treatment, the patient has consented to  Procedure(s): A/V FISTULAGRAM (Left) as a surgical intervention.  The patient's history has been reviewed, patient examined, no change in status, stable for surgery.  I have reviewed the patient's chart and labs.  Questions were answered to the patient's satisfaction.     Marty Heck  HISTORY AND PHYSICAL     CC:  dialysis access Requesting Provider:  Perlie Mayo, NP  HPI: This is a 49 y.o. female here for evaluation of her hemodialysis access.  She had a left BC AVF created by Dr. Scot Dock on 08/17/2019.   She subsequently underwent drug coated balloon angioplasty of the left cephalic vein in July 9371 by Dr. Trula Slade.  On 03/17/2020, she underwent left upper arm cephalic vein angioplasty near cephalic arch by Dr. Carlis Abbott and then venoplasty of left upper arm cephalic vein also by Dr. Carlis Abbott on 05/05/2020.  Pt returns today for follow up.    She has a tunneled dialysis catheter that was placed by IR on 08/11/2019.   She comes in today for evaluation of her fistula after fistulogram.  She denies any pain or numbnessin her left hand.  She states her catheter is working well.   She recently broke her foot in 4 places and had surgery by Dr. Sharol Given on 11/24.    The pt is right hand dominant.    Pt is on dialysis.   Days of dialysis if applicable:  M/W/F    HD center if applicable:  [facility] in [place]    The pt is on a statin for cholesterol management.  The pt is on a daily aspirin.  Other AC:  Brilinta The pt is on BB for hypertension.  The pt is not diabetic.   Tobacco hx:  former      Past Medical History:  Diagnosis Date  . Acute cystitis  without hematuria   . Acute midline thoracic back pain 12/29/2019  . Acute systolic heart failure ([place]) 10/21/2019  . Anemia   . Arthritis   . Cataracts, bilateral    surgery to remove  . CATARACTS, BILATERAL 07/02/2007   Qualifier: Diagnosis of  By: Isla Pence    . Closed fracture of left femur ([place]) 10/28/2018  . Closed fracture of right ankle 11/06/2017  . Diabetes mellitus    type 2  . Emphysematous cystitis 08/26/2018  . Encounter for gynecological examination with Papanicolaou smear of cervix 01/14/2020  . Encounter for screening fecal occult blood testing 01/14/2020  . Encounter for screening for malignant neoplasm of cervix 12/29/2019  . Encounter for screening mammogram for malignant neoplasm of breast 12/29/2019  . ESRD on hemodialysis ([place])    MWF - in [place]  . GERD (gastroesophageal reflux disease)   . Hyperlipidemia   . Hypertension   . Hypokalemia 08/26/2018  . IRREGULAR MENSES 09/14/2009   Qualifier: Diagnosis of  By: Hassell Done FNP, Tori Milks    . Loose stools 11/12/2019  . Normocytic anemia 08/26/2018  . PARONYCHIA, RIGHT GREAT TOE 07/30/2008   Qualifier: Diagnosis of  By: Hassell Done FNP, Tori Milks    . Pressure ulcer 09/10/2019  . Right arm weakness 08/08/2019  . Sprain of left ankle   . STEMI (ST  elevation myocardial infarction) ([place]) 10/18/2019  . STEMI involving right coronary artery ([place]) 10/18/2019  . Stroke ([place]) 10/2019  . SVD (spontaneous vaginal delivery)    x 4  . Vaginosis 08/26/2018  . Weakness 09/10/2019  . Weakness of both lower extremities          Past Surgical History:  Procedure Laterality Date  . A/V FISTULAGRAM Left 01/12/2020   Procedure: A/V FISTULAGRAM;  Surgeon: Serafina Mitchell, MD;  Location: [facility];  Service: Cardiovascular;  Laterality: Left;  . A/V FISTULAGRAM N/A 03/17/2020   Procedure: A/V FISTULAGRAM - Left Arm;  Surgeon: Marty Heck, MD;  Location: [facility];  Service: Cardiovascular;   Laterality: N/A;  . ANKLE FUSION Left 06/01/2020   Procedure: LEFT ANKLE FUSION;  Surgeon: Newt Minion, MD;  Location: [facility];  Service: Orthopedics;  Laterality: Left;  . AV FISTULA PLACEMENT Left 08/17/2019   Procedure: LEFT BRACHIAL CEPHALIC ARTERIOVENOUS (AV) FISTULA;  Surgeon: Angelia Mould, MD;  Location: [facility];  Service: Vascular;  Laterality: Left;  . CORONARY STENT INTERVENTION N/A 10/18/2019   Procedure: CORONARY STENT INTERVENTION;  Surgeon: Sherren Mocha, MD;  Location: [facility];  Service: Cardiovascular;  Laterality: N/A;  . CORONARY/GRAFT ACUTE MI REVASCULARIZATION N/A 10/18/2019   Procedure: Coronary/Graft Acute MI Revascularization;  Surgeon: Sherren Mocha, MD;  Location: [facility];  Service: Cardiovascular;  Laterality: N/A;  . EYE SURGERY Bilateral    cataracts removed  . FEMUR IM NAIL Left 10/28/2018   Procedure: RETROGRADE FEMORAL NAILING;  Surgeon: Meredith Pel, MD;  Location: [facility];  Service: Orthopedics;  Laterality: Left;  . IM NAILING FEMORAL SHAFT RETROGRADE Left 10/28/2018  . INTRAVASCULAR ULTRASOUND/IVUS N/A 10/18/2019   Procedure: Intravascular Ultrasound/IVUS;  Surgeon: Sherren Mocha, MD;  Location: [facility];  Service: Cardiovascular;  Laterality: N/A;  . IR FLUORO GUIDE CV LINE RIGHT  08/11/2019  . IR THORACENTESIS ASP PLEURAL SPACE W/IMG GUIDE  11/12/2019  . IR US GUIDE VASC ACCESS RIGHT  08/11/2019  . KNEE SURGERY Left   . LEFT HEART CATH AND CORONARY ANGIOGRAPHY N/A 10/18/2019   Procedure: LEFT HEART CATH AND CORONARY ANGIOGRAPHY;  Surgeon: Sherren Mocha, MD;  Location: [facility];  Service: Cardiovascular;  Laterality: N/A;  . PERIPHERAL VASCULAR BALLOON ANGIOPLASTY Left 01/12/2020   Procedure: PERIPHERAL VASCULAR BALLOON ANGIOPLASTY;  Surgeon: Serafina Mitchell, MD;  Location: [facility];  Service: Cardiovascular;  Laterality: Left;  AVF  . PERIPHERAL VASCULAR BALLOON ANGIOPLASTY Left  03/17/2020   Procedure: PERIPHERAL VASCULAR BALLOON ANGIOPLASTY;  Surgeon: Marty Heck, MD;  Location: [facility];  Service: Cardiovascular;  Laterality: Left;  AVF  . PERIPHERAL VASCULAR BALLOON ANGIOPLASTY Left 05/05/2020   Procedure: PERIPHERAL VASCULAR BALLOON ANGIOPLASTY;  Surgeon: Marty Heck, MD;  Location: [facility];  Service: Cardiovascular;  Laterality: Left;  arm fistula  . RADIOLOGY WITH ANESTHESIA N/A 09/15/2019   Procedure: [facility] AND LUMBER LOWER BACK PAIN;  Surgeon: Radiologist, Medication, MD;  Location: [facility];  Service: Radiology;  Laterality: N/A;  . TUBAL LIGATION           Allergies  Allergen Reactions  . Contrast Media [Iodinated Diagnostic Agents] Nausea And Vomiting    Treated with Benadryl & Solumedrol  . Penicillins Other (See Comments)    Don't want to take PCN due to family history     Current Outpatient Medications  Medication Sig Dispense Refill  .  aspirin 81 MG EC tablet Take 1 tablet (81 mg total) by mouth daily. 90 tablet 3  . atorvastatin (LIPITOR) 80 MG tablet Take 1 tablet (80 mg total) by mouth daily at 6 PM. 90 tablet 3  . carvedilol (COREG) 6.25 MG tablet Take 1.5 tablets (9.375 mg total) by mouth 2 (two) times daily. 180 tablet 3  . epoetin alfa (EPOGEN) 3000 UNIT/ML injection Inject 3,000 Units into the skin every 14 (fourteen) days.     Marland Kitchen gabapentin (NEURONTIN) 300 MG capsule Take 1 capsule (300 mg total) by mouth at bedtime. 90 capsule 0  . hydrALAZINE (APRESOLINE) 10 MG tablet Take 1 tablet (10 mg total) by mouth in the morning and at bedtime. 180 tablet 3  . insulin aspart protamine- aspart (NOVOLOG MIX 70/30) (70-30) 100 UNIT/ML injection Inject 0.1 mLs (10 Units total) into the skin 2 (two) times daily with a meal. 10 mL 3  . Insulin Syringes, Disposable, U-100 1 ML MISC Use to inject insulin twice daily dx e11.65 100 each 5  . isosorbide mononitrate (IMDUR) 30 MG 24 hr tablet Take 0.5 tablets  (15 mg total) by mouth daily. 15 tablet 11  . levothyroxine (SYNTHROID) 25 MCG tablet Take 1 tablet (25 mcg total) by mouth daily before breakfast. 30 tablet 1  . multivitamin (RENA-VIT) TABS tablet Take 1 tablet by mouth at bedtime. 30 tablet 0  . nitroGLYCERIN (NITROSTAT) 0.4 MG SL tablet Place 1 tablet (0.4 mg total) under the tongue every 5 (five) minutes x 3 doses as needed for chest pain. (Patient taking differently: Place 0.4 mg under the tongue every 5 (five) minutes as needed for chest pain (max 3 doses). ) 25 tablet 3  . ondansetron (ZOFRAN ODT) 4 MG disintegrating tablet 4mg  ODT q4 hours prn nausea/vomit (Patient taking differently: Take 4 mg by mouth every 4 (four) hours as needed for nausea or vomiting. ) 12 tablet 0  . oxyCODONE-acetaminophen (PERCOCET) 5-325 MG tablet Take 1 tablet by mouth every 4 (four) hours as needed. 30 tablet 0  . sertraline (ZOLOFT) 25 MG tablet Take 1 tablet (25 mg total) by mouth daily. 30 tablet 0  . tamsulosin (FLOMAX) 0.4 MG CAPS capsule Take 1 capsule (0.4 mg total) by mouth daily after supper. 90 capsule 0  . ticagrelor (BRILINTA) 90 MG TABS tablet Take 1 tablet (90 mg total) by mouth 2 (two) times daily. 180 tablet 3            Current Facility-Administered Medications  Medication Dose Route Frequency Provider Last Rate Last Admin  . 0.9 %  sodium chloride infusion  250 mL Intravenous PRN Marty Heck, MD      . 0.9 %  sodium chloride infusion  250 mL Intravenous PRN Marty Heck, MD      . sodium chloride flush (NS) 0.9 % injection 3 mL  3 mL Intravenous Q12H Marty Heck, MD      . sodium chloride flush (NS) 0.9 % injection 3 mL  3 mL Intravenous PRN Marty Heck, MD             Family History  Problem Relation Age of Onset  . Diabetes Mother   . Hypertension Mother   . Bipolar disorder Mother   . Brain cancer Maternal Aunt   . Heart disease Maternal Grandmother   . Diabetes Maternal Grandmother   .  Breast cancer Maternal Aunt   . Depression Father        Committed  suicide  . Diabetes Sister     Social History        Socioeconomic History  . Marital status: Widowed    Spouse name: Not on file  . Number of children: 4  . Years of education: Not on file  . Highest education level: 10th grade  Occupational History  . Not on file  Tobacco Use  . Smoking status: Former Smoker    Packs/day: 0.25    Years: 2.00    Pack years: 0.50    Types: Cigarettes    Quit date: 1997    Years since quitting: 24.9  . Smokeless tobacco: Never Used  Vaping Use  . Vaping Use: Never used  Substance and Sexual Activity  . Alcohol use: No  . Drug use: No  . Sexual activity: Yes    Birth control/protection: Surgical    Comment: tubal   Other Topics Concern  . Not on file  Social History Narrative   Lives with friend with Joelene Millin   Cats: Newborn, [place], [place], another kitten      Enjoy: watching TV-[employer], [employer], and [employer]      Diet: eats all food groups: focuses on Dm and Kidney friendly foods    Caffeine: none   Water: Fluid Restriction 32 ounces a day          Wears seat belt: not currently driving    Oceanographer at home    No weapons    Social Determinants of Health      Financial Resource Strain: Low Risk   . Difficulty of Paying Living Expenses: Not hard at all  Food Insecurity: No Food Insecurity  . Worried About Charity fundraiser in the Last Year: Never true  . Ran Out of Food in the Last Year: Never true  Transportation Needs: No Transportation Needs  . Lack of Transportation (Medical): No  . Lack of Transportation (Non-Medical): No  Physical Activity: Inactive  . Days of Exercise per Week: 0 days  . Minutes of Exercise per Session: 0 min  Stress: No Stress Concern Present  . Feeling of Stress : Not at all  Social Connections: Socially Isolated  . Frequency of Communication with Friends and Family: Never  .  Frequency of Social Gatherings with Friends and Family: Never  . Attends Religious Services: Never  . Active Member of Clubs or Organizations: No  . Attends Archivist Meetings: Never  . Marital Status: Widowed  Intimate Partner Violence: Not At Risk  . Fear of Current or Ex-Partner: No  . Emotionally Abused: No  . Physically Abused: No  . Sexually Abused: No     ROS:  See HPI   PHYSICAL EXAMINATION:     Today's Vitals   06/09/20 0934  BP: (!) 83/54  Pulse: 86  Temp: 98.1 F (36.7 C)  TempSrc: Skin  SpO2: 100%  Weight: 135 lb 12.8 oz (61.6 kg)  Height: 5\' 3"  (1.6 m)  PainSc: 9    Body mass index is 24.06 kg/m.    General:  WDWN female in NAD Gait: Not observed HENT: WNL Pulmonary: normal non-labored breathing , without Rales, rhonchi,  wheezing Cardiac: regular, without  Murmur Skin: without rashes, without ulcers  Vascular Exam/Pulses:  2+ left radial pulse Extremities:  + thrill in fistula and mildly pulsatile.  Left hand is warm with motor and sensory in tact.  Musculoskeletal: left foot in boot s/p left ankle surgery        Neurologic: A&O  X 3; Speech is fluent/normal  Non-Invasive Vascular Imaging:   Dialysis duplex on 06/09/2020 Elevated velocity of the outflow vein at the shoulder of 508cm/s   ASSESSMENT/PLAN: 49 y.o. female with ESRD here for evaluation of her hemodialysis access and is s/p left BC AVF created by Dr. Scot Dock on 08/17/2019.   She subsequently underwent drug coated balloon angioplasty of the left cephalic vein in July 7035 by Dr. Trula Slade.  On 03/17/2020, she underwent left upper arm cephalic vein angioplasty near cephalic arch by Dr. Carlis Abbott and then venoplasty of left upper arm cephalic vein also by Dr. Carlis Abbott on 05/05/2020  -discussed pt and duplex with Dr. Oneida Alar.  Will set up for fistulogram for possible angioplasty vs surgical repair vs new access given recurrence of stenosis of cephalic vein.   Discussed with pt and  she is in agreement with plan. -pt is on dialysis -pt is right hand dominant -pt is on [place], [facility] [phone]

## 2020-06-23 NOTE — Progress Notes (Signed)
Pt continues to c/o pain in her left foot. [name], Rn called states she will check with the Dr Pt informed

## 2020-06-23 NOTE — Progress Notes (Signed)
Office Visit Note   Patient: Melanie Hall           Date of Birth: [birth year]           MRN: [id number] Visit Date: 06/23/2020              Requested by: Perlie Mayo, NP [address] [place],  [place] [address] PCP: Perlie Mayo, NP  Chief Complaint  Patient presents with  . Left Ankle - Routine Post Op    06/01/20 left ankle fusion       HPI: Patient presents today 3 weeks status post ankle fusion.  Patient presents because she had a fall while getting into a cab after procedure at the hospital today.  Assessment & Plan: Visit Diagnoses:  1. Pain in right ankle and joints of right foot   2. Closed pilon fracture, left, initial encounter     Plan: We will place a silver alginate 4 x 4's and a short leg nonweightbearing cast.  She will follow-up in 2 weeks.  She was seen by Dr. Sharol Given and he did tell her she is at high risk for amputation.  Follow-Up Instructions: No follow-ups on file.   Ortho Exam  Patient is alert, oriented, no adenopathy, well-dressed, normal affect, normal respiratory effort. Examination of her left ankle demonstrates moderate soft tissue swelling.  Sutures are in place with some wound dehiscence.  There are some mild erythema around the wound but no cellulitis.  Compartments are compressible.  Skin is intact.  Imaging: PERIPHERAL VASCULAR CATHETERIZATION  Result Date: 06/23/2020 OPERATIVE NOTE   PROCEDURE: 1. left brachiocephalic arteriovenous fistula cannulation under ultrasound guidance 2. left arm fistulogram including central venogram  PRE-OPERATIVE DIAGNOSIS: Malfunctioning left arteriovenous fistula  POST-OPERATIVE DIAGNOSIS: same as above  SURGEON: Marty Heck, MD  ANESTHESIA: local  ESTIMATED BLOOD LOSS: 5 cc  FINDING(S): 1. No evidence of central venous stenosis.  The cephalic arch was patent today with no flow limiting stenosis where she has previously had a stenotic segment.  I do not see any flow limitation and the left  upper arm AV fistula appears patent with excellent thrill.  I discussed that if this fistula does not work she will likely need be evaluated for new access placement.  I do not think any further fistulogram's are warranted.  SPECIMEN(S):  None  CONTRAST: 20 cc  INDICATIONS: Melanie Hall is a 49 y.o. female who  presents with malfunctioning left arteriovenous fistula.  The patient is scheduled for left arm fistulogram.  The patient is aware the risks include but are not limited to: bleeding, infection, thrombosis of the cannulated access, and possible anaphylactic reaction to the contrast.  The patient is aware of the risks of the procedure and elects to proceed forward.  DESCRIPTION: After full informed written consent was obtained, the patient was brought back to the angiography suite and placed supine upon the angiography table.  The patient was connected to monitoring equipment.  The left arm was prepped and draped in the standard fashion for a left fistulogram.  Under ultrasound guidance, the left arteriovenous fistula was evaluated, it was patent, an image was saved.  It was cannulated with a micropuncture needle under ultrasound guidance.  The microwire was advanced into the fistula and the needle was exchanged for the a microsheath, which was lodged 2 cm into the access.  The wire was removed and the sheath was connected to the IV extension tubing.  Hand injections were  completed to image the access from the antecubitum up to the level of axilla.  The central venous structures were also imaged by hand injections.  Based on the images, this patient will need: no intervention.  A 4-0 Monocryl purse-string suture was sewn around the sheath.  The sheath was removed while tying down the suture.  A sterile bandage was applied to the puncture site.  COMPLICATIONS: None  CONDITION: Stable  Marty Heck, MD [facility]: [phone]   No images are attached to  the encounter.  Labs: Lab Results  Component Value Date   HGBA1C 8.7 (H) 06/02/2020   HGBA1C 7.6 (H) 10/18/2019   HGBA1C 7.2 (H) 09/10/2019   ESRSEDRATE 45 (H) 09/10/2019   CRP 5.0 (H) 09/10/2019   LABURIC 5.7 05/27/2019   REPTSTATUS 11/13/2019 FINAL 11/12/2019   REPTSTATUS 11/17/2019 FINAL 11/12/2019   GRAMSTAIN  11/12/2019    WBC PRESENT,BOTH PMN AND MONONUCLEAR NO ORGANISMS SEEN CYTOSPIN SMEAR Performed at [facility], [place] [address] [place], [place] [address]    CULT  11/12/2019    NO GROWTH 5 DAYS Performed at [facility] [address], [place], [place] [address]    LABORGA ESCHERICHIA COLI (A) 08/26/2018     Lab Results  Component Value Date   ALBUMIN 2.4 (L) 06/03/2020   ALBUMIN 3.9 12/15/2019   ALBUMIN 2.5 (L) 10/19/2019   LABURIC 5.7 05/27/2019    Lab Results  Component Value Date   MG 1.8 08/24/2019   MG 1.7 08/23/2019   MG 1.7 08/09/2019   Lab Results  Component Value Date   VD25OH 8.82 (L) 05/27/2019    No results found for: PREALBUMIN CBC EXTENDED Latest Ref Rng & Units 06/23/2020 06/03/2020 06/01/2020  WBC 4.0 - 10.5 K/uL - 7.0 -  RBC 3.87 - 5.11 MIL/uL - 2.05(L) -  HGB 12.0 - 15.0 g/dL 9.9(L) 6.4(LL) 9.9(L)  HCT 36.0 - 46.0 % 29.0(L) 20.4(L) 29.0(L)  PLT 150 - 400 K/uL - 184 -  NEUTROABS 1.7 - 7.7 K/uL - - -  LYMPHSABS 0.7 - 4.0 K/uL - - -     There is no height or weight on file to calculate BMI.  Orders:  Orders Placed This Encounter  Procedures  . XR Ankle Complete Left   No orders of the defined types were placed in this encounter.    Procedures: No procedures performed  Clinical Data: No additional findings.  ROS:  All other systems negative, except as noted in the HPI. Review of Systems  Objective: Vital Signs: LMP 12/17/2016   Specialty Comments:  No specialty comments available.  PMFS History: Patient Active Problem List   Diagnosis Date Noted  . Non-intractable vomiting 06/16/2020  .  Encounter for support and coordination of transition of care 06/16/2020  . S/P ankle fusion 06/01/2020  . Pilon fracture of left tibia, closed, initial encounter   . Displaced trimalleolar fracture of left lower leg, initial encounter for closed fracture   . Abnormality of gait and mobility 05/26/2020  . Migraine 05/26/2020  . Diabetic retinopathy ([place]) 05/04/2020  . Chronic diarrhea of unknown origin 01/14/2020  . Type 2 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, with long-term current use of insulin ([place]) 12/29/2019  . Hypothyroidism 12/29/2019  . Weakness of both lower extremities 11/12/2019  . Debility 09/10/2019  . ESRD (end stage renal disease) on dialysis ([place]) 09/10/2019  . Depression 09/10/2019  . Incontinence in female 09/10/2019  .  Anemia in chronic kidney disease 08/19/2019  . MGUS (monoclonal gammopathy of unknown significance) 07/23/2019  . GERD (gastroesophageal reflux disease) 02/28/2019  . Essential hypertension 10/28/2018  . Irregular menses 09/14/2009  . Hypercholesterolemia 11/19/2008  . Type 2 diabetes with nephropathy ([place]) 07/02/2007   Past Medical History:  Diagnosis Date  . Acute cystitis without hematuria   . Acute midline thoracic back pain 12/29/2019  . Acute systolic heart failure ([place]) 10/21/2019  . Anemia   . Arthritis   . Cataracts, bilateral    surgery to remove  . CATARACTS, BILATERAL 07/02/2007   Qualifier: Diagnosis of  By: Isla Pence    . Closed fracture of left femur ([place]) 10/28/2018  . Closed fracture of right ankle 11/06/2017  . Diabetes mellitus    type 2  . Emphysematous cystitis 08/26/2018  . Encounter for gynecological examination with Papanicolaou smear of cervix 01/14/2020  . Encounter for screening fecal occult blood testing 01/14/2020  . Encounter for screening for malignant neoplasm of cervix 12/29/2019  . Encounter for screening mammogram for malignant neoplasm of breast 12/29/2019  . ESRD on hemodialysis  ([place])    MWF - in [place]  . GERD (gastroesophageal reflux disease)   . Hyperlipidemia   . Hypertension   . Hypokalemia 08/26/2018  . IRREGULAR MENSES 09/14/2009   Qualifier: Diagnosis of  By: Hassell Done FNP, Tori Milks    . Loose stools 11/12/2019  . Normocytic anemia 08/26/2018  . PARONYCHIA, RIGHT GREAT TOE 07/30/2008   Qualifier: Diagnosis of  By: Hassell Done FNP, Tori Milks    . Pressure ulcer 09/10/2019  . Right arm weakness 08/08/2019  . Sprain of left ankle   . STEMI (ST elevation myocardial infarction) ([place]) 10/18/2019  . STEMI involving right coronary artery ([place]) 10/18/2019  . Stroke ([place]) 10/2019  . SVD (spontaneous vaginal delivery)    x 4  . Vaginosis 08/26/2018  . Weakness 09/10/2019  . Weakness of both lower extremities     Family History  Problem Relation Age of Onset  . Diabetes Mother   . Hypertension Mother   . Bipolar disorder Mother   . Brain cancer Maternal Aunt   . Heart disease Maternal Grandmother   . Diabetes Maternal Grandmother   . Breast cancer Maternal Aunt   . Depression Father        Committed suicide  . Diabetes Sister     Past Surgical History:  Procedure Laterality Date  . A/V FISTULAGRAM Left 01/12/2020   Procedure: A/V FISTULAGRAM;  Surgeon: Serafina Mitchell, MD;  Location: [facility];  Service: Cardiovascular;  Laterality: Left;  . A/V FISTULAGRAM N/A 03/17/2020   Procedure: A/V FISTULAGRAM - Left Arm;  Surgeon: Marty Heck, MD;  Location: [facility];  Service: Cardiovascular;  Laterality: N/A;  . ANKLE FUSION Left 06/01/2020   Procedure: LEFT ANKLE FUSION;  Surgeon: Newt Minion, MD;  Location: [facility];  Service: Orthopedics;  Laterality: Left;  . AV FISTULA PLACEMENT Left 08/17/2019   Procedure: LEFT BRACHIAL CEPHALIC ARTERIOVENOUS (AV) FISTULA;  Surgeon: Angelia Mould, MD;  Location: [facility];  Service: Vascular;  Laterality: Left;  . CORONARY STENT INTERVENTION N/A 10/18/2019   Procedure: CORONARY STENT INTERVENTION;  Surgeon:  Sherren Mocha, MD;  Location: [facility];  Service: Cardiovascular;  Laterality: N/A;  . CORONARY/GRAFT ACUTE MI REVASCULARIZATION N/A 10/18/2019   Procedure: Coronary/Graft Acute MI Revascularization;  Surgeon: Sherren Mocha, MD;  Location: [facility];  Service: Cardiovascular;  Laterality: N/A;  .  EYE SURGERY Bilateral    cataracts removed  . FEMUR IM NAIL Left 10/28/2018   Procedure: RETROGRADE FEMORAL NAILING;  Surgeon: Meredith Pel, MD;  Location: [facility];  Service: Orthopedics;  Laterality: Left;  . IM NAILING FEMORAL SHAFT RETROGRADE Left 10/28/2018  . INTRAVASCULAR ULTRASOUND/IVUS N/A 10/18/2019   Procedure: Intravascular Ultrasound/IVUS;  Surgeon: Sherren Mocha, MD;  Location: [facility];  Service: Cardiovascular;  Laterality: N/A;  . IR FLUORO GUIDE CV LINE RIGHT  08/11/2019  . IR THORACENTESIS ASP PLEURAL SPACE W/IMG GUIDE  11/12/2019  . IR US GUIDE VASC ACCESS RIGHT  08/11/2019  . KNEE SURGERY Left   . LEFT HEART CATH AND CORONARY ANGIOGRAPHY N/A 10/18/2019   Procedure: LEFT HEART CATH AND CORONARY ANGIOGRAPHY;  Surgeon: Sherren Mocha, MD;  Location: [facility];  Service: Cardiovascular;  Laterality: N/A;  . PERIPHERAL VASCULAR BALLOON ANGIOPLASTY Left 01/12/2020   Procedure: PERIPHERAL VASCULAR BALLOON ANGIOPLASTY;  Surgeon: Serafina Mitchell, MD;  Location: [facility];  Service: Cardiovascular;  Laterality: Left;  AVF  . PERIPHERAL VASCULAR BALLOON ANGIOPLASTY Left 03/17/2020   Procedure: PERIPHERAL VASCULAR BALLOON ANGIOPLASTY;  Surgeon: Marty Heck, MD;  Location: [facility];  Service: Cardiovascular;  Laterality: Left;  AVF  . PERIPHERAL VASCULAR BALLOON ANGIOPLASTY Left 05/05/2020   Procedure: PERIPHERAL VASCULAR BALLOON ANGIOPLASTY;  Surgeon: Marty Heck, MD;  Location: [facility];  Service: Cardiovascular;  Laterality: Left;  arm fistula  . RADIOLOGY WITH ANESTHESIA N/A 09/15/2019   Procedure: [facility] AND  LUMBER LOWER BACK PAIN;  Surgeon: Radiologist, Medication, MD;  Location: [facility];  Service: Radiology;  Laterality: N/A;  . TUBAL LIGATION     Social History   Occupational History  . Not on file  Tobacco Use  . Smoking status: Former Smoker    Packs/day: 0.25    Years: 2.00    Pack years: 0.50    Types: Cigarettes    Quit date: 1997    Years since quitting: 24.9  . Smokeless tobacco: Never Used  Vaping Use  . Vaping Use: Never used  Substance and Sexual Activity  . Alcohol use: No  . Drug use: No  . Sexual activity: Yes    Birth control/protection: Surgical    Comment: tubal

## 2020-06-23 NOTE — Progress Notes (Signed)
Pt states she took prednisone last night and this am, but vomited this am after taking. She also states that she didn't have any Benadryl to take and was not told to take it, [name], Rn called and informed of LAb results and above.

## 2020-06-23 NOTE — Op Note (Signed)
    OPERATIVE NOTE   PROCEDURE: 1. left brachiocephalic arteriovenous fistula cannulation under ultrasound guidance 2. left arm fistulogram including central venogram  PRE-OPERATIVE DIAGNOSIS: Malfunctioning left arteriovenous fistula  POST-OPERATIVE DIAGNOSIS: same as above   SURGEON: Marty Heck, MD  ANESTHESIA: local  ESTIMATED BLOOD LOSS: 5 cc  FINDING(S): 1. No evidence of central venous stenosis.  The cephalic arch was patent today with no flow limiting stenosis where she has previously had a stenotic segment.  I do not see any flow limitation and the left upper arm AV fistula appears patent with excellent thrill.  I discussed that if this fistula does not work she will likely need be evaluated for new access placement.  I do not think any further fistulogram's are warranted.  SPECIMEN(S):  None  CONTRAST: 20 cc  INDICATIONS: Melanie Hall is a 49 y.o. female who  presents with malfunctioning left arteriovenous fistula.  The patient is scheduled for left arm fistulogram.  The patient is aware the risks include but are not limited to: bleeding, infection, thrombosis of the cannulated access, and possible anaphylactic reaction to the contrast.  The patient is aware of the risks of the procedure and elects to proceed forward.  DESCRIPTION: After full informed written consent was obtained, the patient was brought back to the angiography suite and placed supine upon the angiography table.  The patient was connected to monitoring equipment.  The left arm was prepped and draped in the standard fashion for a left fistulogram.  Under ultrasound guidance, the left arteriovenous fistula was evaluated, it was patent, an image was saved.  It was cannulated with a micropuncture needle under ultrasound guidance.  The microwire was advanced into the fistula and the needle was exchanged for the a microsheath, which was lodged 2 cm into the access.  The wire was removed and the sheath was  connected to the IV extension tubing.  Hand injections were completed to image the access from the antecubitum up to the level of axilla.  The central venous structures were also imaged by hand injections.  Based on the images, this patient will need: no intervention.  A 4-0 Monocryl purse-string suture was sewn around the sheath.  The sheath was removed while tying down the suture.  A sterile bandage was applied to the puncture site.  COMPLICATIONS: None  CONDITION: Stable  Marty Heck, MD [facility] Office: [phone]  06/23/2020 11:36 AM

## 2020-06-23 NOTE — Discharge Instructions (Signed)
° °  [facility] ° °Discharge Instructions ° °AV Fistula or Graft Surgery for Dialysis Access ° °Please refer to the following instructions for your post-procedure care. Your surgeon or physician assistant will discuss any changes with you. ° °Activity ° °You may drive the day following your surgery, if you are comfortable and no longer taking prescription pain medication. Resume full activity as the soreness in your incision resolves. ° °Bathing/Showering ° °You may shower after you go home. Keep your incision dry for 48 hours. Do not soak in a bathtub, hot tub, or swim until the incision heals completely. You may not shower if you have a hemodialysis catheter. ° °Incision Care ° °Clean your incision with mild soap and water after 48 hours. Pat the area dry with a clean towel. You do not need a bandage unless otherwise instructed. Do not apply any ointments or creams to your incision. You may have skin glue on your incision. Do not peel it off. It will come off on its own in about one week. Your arm may swell a bit after surgery. To reduce swelling use pillows to elevate your arm so it is above your heart. Your doctor will tell you if you need to lightly wrap your arm with an ACE bandage. ° °Diet ° °Resume your normal diet. There are not special food restrictions following this procedure. In order to heal from your surgery, it is CRITICAL to get adequate nutrition. Your body requires vitamins, minerals, and protein. Vegetables are the best source of vitamins and minerals. Vegetables also provide the perfect balance of protein. Processed food has little nutritional value, so try to avoid this. ° °Medications ° °Resume taking all of your medications. If your incision is causing pain, you may take over-the counter pain relievers such as acetaminophen (Tylenol). If you were prescribed a stronger pain medication, please be aware these medications can cause nausea and constipation. Prevent  nausea by taking the medication with a snack or meal. Avoid constipation by drinking plenty of fluids and eating foods with high amount of fiber, such as fruits, vegetables, and grains. Do not take Tylenol if you are taking prescription pain medications. ° ° ° ° °Follow up °Your surgeon may want to see you in the office following your access surgery. If so, this will be arranged at the time of your surgery. ° °Please call us immediately for any of the following conditions: ° °Increased pain, redness, drainage (pus) from your incision site °Fever of 101 degrees or higher °Severe or worsening pain at your incision site °Hand pain or numbness. ° °Reduce your risk of vascular disease: ° °Stop smoking. If you would like help, call [facility] at 1-800-QUIT-NOW (1-800-784-8669) or [facility] at 336-586-4000 ° °Manage your cholesterol °Maintain a desired weight °Control your diabetes °Keep your blood pressure down ° °Dialysis ° °It will take several weeks to several months for your new dialysis access to be ready for use. Your surgeon will determine when it is OK to use it. Your nephrologist will continue to direct your dialysis. You can continue to use your Permcath until your new access is ready for use. ° °If you have any questions, please call the office at [phone]. ° °

## 2020-06-23 NOTE — Progress Notes (Signed)
Pt ride home has still not arrived Social work called and arranged for cab to come and take pt to her Dr appointment. Pt states I can get a ride home later if you can just get me to the appointment.

## 2020-06-24 ENCOUNTER — Encounter (HOSPITAL_COMMUNITY): Payer: Self-pay | Admitting: Vascular Surgery

## 2020-06-28 ENCOUNTER — Ambulatory Visit: Payer: Medicaid Other | Admitting: Physician Assistant

## 2020-07-04 ENCOUNTER — Telehealth: Payer: Self-pay | Admitting: Orthopedic Surgery

## 2020-07-04 ENCOUNTER — Other Ambulatory Visit: Payer: Self-pay | Admitting: Physician Assistant

## 2020-07-04 MED ORDER — OXYCODONE-ACETAMINOPHEN 5-325 MG PO TABS: 1.0000 | ORAL_TABLET | ORAL | 0 refills | Status: DC | PRN

## 2020-07-04 NOTE — Telephone Encounter (Signed)
refilled 

## 2020-07-04 NOTE — Telephone Encounter (Signed)
I called patient to advise, no answer, voicemail does not state name. No voicemail left.

## 2020-07-04 NOTE — Telephone Encounter (Signed)
I called patient to advise. Voicemail picked up again. Will wait for return call to advise.

## 2020-07-04 NOTE — Telephone Encounter (Signed)
Patient called. She would like a refill on hydrocodone. Her call back number is [phone]

## 2020-07-04 NOTE — Telephone Encounter (Signed)
Please advise 

## 2020-07-08 DIAGNOSIS — Z992 Dependence on renal dialysis: Secondary | ICD-10-CM | POA: Diagnosis not present

## 2020-07-08 DIAGNOSIS — N186 End stage renal disease: Secondary | ICD-10-CM | POA: Diagnosis not present

## 2020-07-12 ENCOUNTER — Ambulatory Visit: Payer: Medicaid Other | Admitting: Orthopedic Surgery

## 2020-07-21 ENCOUNTER — Ambulatory Visit: Payer: Self-pay

## 2020-07-21 ENCOUNTER — Ambulatory Visit (INDEPENDENT_AMBULATORY_CARE_PROVIDER_SITE_OTHER): Payer: Medicaid Other | Admitting: Physician Assistant

## 2020-07-21 ENCOUNTER — Encounter: Payer: Self-pay | Admitting: Orthopedic Surgery

## 2020-07-21 DIAGNOSIS — M25571 Pain in right ankle and joints of right foot: Secondary | ICD-10-CM

## 2020-07-21 MED ORDER — DOXYCYCLINE HYCLATE 100 MG PO TABS: 100.0000 mg | ORAL_TABLET | Freq: Two times a day (BID) | ORAL | 0 refills | Status: DC

## 2020-07-21 MED ORDER — OXYCODONE-ACETAMINOPHEN 5-325 MG PO TABS: 1.0000 | ORAL_TABLET | ORAL | 0 refills | Status: DC | PRN

## 2020-07-21 MED ORDER — SILVER SULFADIAZINE 1 % EX CREA: 1.0000 "application " | TOPICAL_CREAM | Freq: Every day | CUTANEOUS | 0 refills | Status: DC

## 2020-07-21 NOTE — Progress Notes (Signed)
Office Visit Note   Patient: Melanie Hall           Date of Birth: [birth year]           MRN: [id number] Visit Date: 07/21/2020              Requested by: Perlie Mayo, NP [address] [place],  [place] [address] PCP: Perlie Mayo, NP  Chief Complaint  Patient presents with  . Right Ankle - Routine Post Op    06/01/20 left ankle fusion       HPI: Patient is a 50 year old woman who is 2-1/2 months status post left ankle fusion.  She has a history of diabetes and end-stage renal disease.  She has had difficulty maintaining her weightbearing status.  At her last visit 4 weeks ago she was placed in a short leg nonweightbearing cast.  She was told to follow-up in 2 weeks for the cast removal.  She follows up today.  She states she has been putting no to very little weight on her leg.  She does say that the cast was cutting into her big toe so her boyfriend cut a hole in that area.  She has an obvious area of skin breakdown with some associated erythema.  Of note the patient was seen to be full weightbearing when she was in the waiting room  Assessment & Plan: Visit Diagnoses:  1. Pain in right ankle and joints of right foot     Plan: Patient was seen by Dr. Sharol Given again we reiterated the importance of maintaining nonweightbearing status.  She may go back into her boot and should do daily Silvadene dressing changes to both the area on her toe and the anterior aspect of her ankle.  Follow-up in 1 week for recheck x-rays at that time patient will also go on a course of doxycycline.  Have called her in some pain pills but she is to have these last till next week.  Also called her in some Silvadene for her dressing changes.  Follow-Up Instructions: No follow-ups on file.   Ortho Exam  Patient is alert, oriented, no adenopathy, well-dressed, normal affect, normal respiratory effort. Examination of the left ankle moderate amount of soft tissue swelling.  Ankle incision is chronically dehisced  through the central portion but not dehisced deeply.  Mild surrounding erythema but no ascending cellulitis.  Surgical sutures are in place.  She also has a area of superficial laceration that does not go deeply over the great toe.  There is no foul odor but there is some associated erythema in the great toe  Imaging: No results found. No images are attached to the encounter.  Labs: Lab Results  Component Value Date   HGBA1C 8.7 (H) 06/02/2020   HGBA1C 7.6 (H) 10/18/2019   HGBA1C 7.2 (H) 09/10/2019   ESRSEDRATE 45 (H) 09/10/2019   CRP 5.0 (H) 09/10/2019   LABURIC 5.7 05/27/2019   REPTSTATUS 11/13/2019 FINAL 11/12/2019   REPTSTATUS 11/17/2019 FINAL 11/12/2019   GRAMSTAIN  11/12/2019    WBC PRESENT,BOTH PMN AND MONONUCLEAR NO ORGANISMS SEEN CYTOSPIN SMEAR Performed at [facility], [place] [address], [place], [place] [address]    CULT  11/12/2019    NO GROWTH 5 DAYS Performed at [facility] [address], [place], [place] [address]    LABORGA ESCHERICHIA COLI (A) 08/26/2018     Lab Results  Component Value Date   ALBUMIN 2.4 (L) 06/03/2020  ALBUMIN 3.9 12/15/2019   ALBUMIN 2.5 (L) 10/19/2019   LABURIC 5.7 05/27/2019    Lab Results  Component Value Date   MG 1.8 08/24/2019   MG 1.7 08/23/2019   MG 1.7 08/09/2019   Lab Results  Component Value Date   VD25OH 8.82 (L) 05/27/2019    No results found for: PREALBUMIN CBC EXTENDED Latest Ref Rng & Units 06/23/2020 06/03/2020 06/01/2020  WBC 4.0 - 10.5 K/uL - 7.0 -  RBC 3.87 - 5.11 MIL/uL - 2.05(L) -  HGB 12.0 - 15.0 g/dL 9.9(L) 6.4(LL) 9.9(L)  HCT 36.0 - 46.0 % 29.0(L) 20.4(L) 29.0(L)  PLT 150 - 400 K/uL - 184 -  NEUTROABS 1.7 - 7.7 K/uL - - -  LYMPHSABS 0.7 - 4.0 K/uL - - -     There is no height or weight on file to calculate BMI.  Orders:  Orders Placed This Encounter  Procedures  . XR Ankle Complete Left   Meds ordered this encounter  Medications  . oxyCODONE-acetaminophen  (PERCOCET/ROXICET) 5-325 MG tablet    Sig: Take 1 tablet by mouth every 4 (four) hours as needed for severe pain.    Dispense:  10 tablet    Refill:  0  . doxycycline (VIBRA-TABS) 100 MG tablet    Sig: Take 1 tablet (100 mg total) by mouth 2 (two) times daily.    Dispense:  60 tablet    Refill:  0  . silver sulfADIAZINE (SILVADENE) 1 % cream    Sig: Apply 1 application topically daily.    Dispense:  50 g    Refill:  0     Procedures: No procedures performed  Clinical Data: No additional findings.  ROS:  All other systems negative, except as noted in the HPI. Review of Systems  Objective: Vital Signs: LMP 12/17/2016   Specialty Comments:  No specialty comments available.  PMFS History: Patient Active Problem List   Diagnosis Date Noted  . Non-intractable vomiting 06/16/2020  . Encounter for support and coordination of transition of care 06/16/2020  . S/P ankle fusion 06/01/2020  . Pilon fracture of left tibia, closed, initial encounter   . Displaced trimalleolar fracture of left lower leg, initial encounter for closed fracture   . Abnormality of gait and mobility 05/26/2020  . Migraine 05/26/2020  . Diabetic retinopathy ([place]) 05/04/2020  . Chronic diarrhea of unknown origin 01/14/2020  . Type 2 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, with long-term current use of insulin ([place]) 12/29/2019  . Hypothyroidism 12/29/2019  . Weakness of both lower extremities 11/12/2019  . Debility 09/10/2019  . ESRD (end stage renal disease) on dialysis ([place]) 09/10/2019  . Depression 09/10/2019  . Incontinence in female 09/10/2019  . Anemia in chronic kidney disease 08/19/2019  . MGUS (monoclonal gammopathy of unknown significance) 07/23/2019  . GERD (gastroesophageal reflux disease) 02/28/2019  . Essential hypertension 10/28/2018  . Irregular menses 09/14/2009  . Hypercholesterolemia 11/19/2008  . Type 2 diabetes with nephropathy ([place]) 07/02/2007   Past  Medical History:  Diagnosis Date  . Acute cystitis without hematuria   . Acute midline thoracic back pain 12/29/2019  . Acute systolic heart failure ([place]) 10/21/2019  . Anemia   . Arthritis   . Cataracts, bilateral    surgery to remove  . CATARACTS, BILATERAL 07/02/2007   Qualifier: Diagnosis of  By: Isla Pence    . Closed fracture of left femur ([place]) 10/28/2018  . Closed fracture of right ankle 11/06/2017  . Diabetes mellitus  type 2  . Emphysematous cystitis 08/26/2018  . Encounter for gynecological examination with Papanicolaou smear of cervix 01/14/2020  . Encounter for screening fecal occult blood testing 01/14/2020  . Encounter for screening for malignant neoplasm of cervix 12/29/2019  . Encounter for screening mammogram for malignant neoplasm of breast 12/29/2019  . ESRD on hemodialysis ([place])    MWF - in [place]  . GERD (gastroesophageal reflux disease)   . Hyperlipidemia   . Hypertension   . Hypokalemia 08/26/2018  . IRREGULAR MENSES 09/14/2009   Qualifier: Diagnosis of  By: Hassell Done FNP, Tori Milks    . Loose stools 11/12/2019  . Normocytic anemia 08/26/2018  . PARONYCHIA, RIGHT GREAT TOE 07/30/2008   Qualifier: Diagnosis of  By: Hassell Done FNP, Tori Milks    . Pressure ulcer 09/10/2019  . Right arm weakness 08/08/2019  . Sprain of left ankle   . STEMI (ST elevation myocardial infarction) ([place]) 10/18/2019  . STEMI involving right coronary artery ([place]) 10/18/2019  . Stroke ([place]) 10/2019  . SVD (spontaneous vaginal delivery)    x 4  . Vaginosis 08/26/2018  . Weakness 09/10/2019  . Weakness of both lower extremities     Family History  Problem Relation Age of Onset  . Diabetes Mother   . Hypertension Mother   . Bipolar disorder Mother   . Brain cancer Maternal Aunt   . Heart disease Maternal Grandmother   . Diabetes Maternal Grandmother   . Breast cancer Maternal Aunt   . Depression Father        Committed suicide  . Diabetes Sister     Past Surgical History:  Procedure  Laterality Date  . A/V FISTULAGRAM Left 01/12/2020   Procedure: A/V FISTULAGRAM;  Surgeon: Serafina Mitchell, MD;  Location: [facility];  Service: Cardiovascular;  Laterality: Left;  . A/V FISTULAGRAM N/A 03/17/2020   Procedure: A/V FISTULAGRAM - Left Arm;  Surgeon: Marty Heck, MD;  Location: [facility];  Service: Cardiovascular;  Laterality: N/A;  . A/V FISTULAGRAM Left 06/23/2020   Procedure: A/V FISTULAGRAM;  Surgeon: Marty Heck, MD;  Location: [facility];  Service: Cardiovascular;  Laterality: Left;  . ANKLE FUSION Left 06/01/2020   Procedure: LEFT ANKLE FUSION;  Surgeon: Newt Minion, MD;  Location: [facility];  Service: Orthopedics;  Laterality: Left;  . AV FISTULA PLACEMENT Left 08/17/2019   Procedure: LEFT BRACHIAL CEPHALIC ARTERIOVENOUS (AV) FISTULA;  Surgeon: Angelia Mould, MD;  Location: [facility];  Service: Vascular;  Laterality: Left;  . CORONARY STENT INTERVENTION N/A 10/18/2019   Procedure: CORONARY STENT INTERVENTION;  Surgeon: Sherren Mocha, MD;  Location: [facility];  Service: Cardiovascular;  Laterality: N/A;  . CORONARY/GRAFT ACUTE MI REVASCULARIZATION N/A 10/18/2019   Procedure: Coronary/Graft Acute MI Revascularization;  Surgeon: Sherren Mocha, MD;  Location: [facility];  Service: Cardiovascular;  Laterality: N/A;  . EYE SURGERY Bilateral    cataracts removed  . FEMUR IM NAIL Left 10/28/2018   Procedure: RETROGRADE FEMORAL NAILING;  Surgeon: Meredith Pel, MD;  Location: [facility];  Service: Orthopedics;  Laterality: Left;  . IM NAILING FEMORAL SHAFT RETROGRADE Left 10/28/2018  . INTRAVASCULAR ULTRASOUND/IVUS N/A 10/18/2019   Procedure: Intravascular Ultrasound/IVUS;  Surgeon: Sherren Mocha, MD;  Location: [facility];  Service: Cardiovascular;  Laterality: N/A;  . IR FLUORO GUIDE CV LINE RIGHT  08/11/2019  . IR THORACENTESIS ASP PLEURAL SPACE W/IMG GUIDE  11/12/2019  . IR US GUIDE VASC ACCESS RIGHT  08/11/2019   .  KNEE SURGERY Left   . LEFT HEART CATH AND CORONARY ANGIOGRAPHY N/A 10/18/2019   Procedure: LEFT HEART CATH AND CORONARY ANGIOGRAPHY;  Surgeon: Sherren Mocha, MD;  Location: [facility];  Service: Cardiovascular;  Laterality: N/A;  . PERIPHERAL VASCULAR BALLOON ANGIOPLASTY Left 01/12/2020   Procedure: PERIPHERAL VASCULAR BALLOON ANGIOPLASTY;  Surgeon: Serafina Mitchell, MD;  Location: [facility];  Service: Cardiovascular;  Laterality: Left;  AVF  . PERIPHERAL VASCULAR BALLOON ANGIOPLASTY Left 03/17/2020   Procedure: PERIPHERAL VASCULAR BALLOON ANGIOPLASTY;  Surgeon: Marty Heck, MD;  Location: [facility];  Service: Cardiovascular;  Laterality: Left;  AVF  . PERIPHERAL VASCULAR BALLOON ANGIOPLASTY Left 05/05/2020   Procedure: PERIPHERAL VASCULAR BALLOON ANGIOPLASTY;  Surgeon: Marty Heck, MD;  Location: [facility];  Service: Cardiovascular;  Laterality: Left;  arm fistula  . RADIOLOGY WITH ANESTHESIA N/A 09/15/2019   Procedure: [facility] AND LUMBER LOWER BACK PAIN;  Surgeon: Radiologist, Medication, MD;  Location: [facility];  Service: Radiology;  Laterality: N/A;  . TUBAL LIGATION     Social History   Occupational History  . Not on file  Tobacco Use  . Smoking status: Former Smoker    Packs/day: 0.25    Years: 2.00    Pack years: 0.50    Types: Cigarettes    Quit date: 1997    Years since quitting: 25.0  . Smokeless tobacco: Never Used  Vaping Use  . Vaping Use: Never used  Substance and Sexual Activity  . Alcohol use: No  . Drug use: No  . Sexual activity: Yes    Birth control/protection: Surgical    Comment: tubal

## 2020-07-26 ENCOUNTER — Encounter: Payer: Self-pay | Admitting: Physician Assistant

## 2020-07-26 ENCOUNTER — Ambulatory Visit (INDEPENDENT_AMBULATORY_CARE_PROVIDER_SITE_OTHER): Payer: Medicaid Other

## 2020-07-26 ENCOUNTER — Other Ambulatory Visit: Payer: Self-pay

## 2020-07-26 ENCOUNTER — Ambulatory Visit (INDEPENDENT_AMBULATORY_CARE_PROVIDER_SITE_OTHER): Payer: Medicaid Other | Admitting: Physician Assistant

## 2020-07-26 DIAGNOSIS — S72402D Unspecified fracture of lower end of left femur, subsequent encounter for closed fracture with routine healing: Secondary | ICD-10-CM | POA: Diagnosis not present

## 2020-07-26 MED ORDER — OXYCODONE-ACETAMINOPHEN 5-325 MG PO TABS: 1.0000 | ORAL_TABLET | ORAL | 0 refills | Status: DC | PRN

## 2020-07-26 NOTE — Progress Notes (Signed)
Office Visit Note   Patient: Melanie Hall           Date of Birth: Jul 05, 1971           MRN: [id number] Visit Date: 07/26/2020              Requested by: Perlie Mayo, NP [address] [place],  [place] [address] PCP: Perlie Mayo, NP  No chief complaint on file.     HPI: Patient is a 50 year old woman who is almost 64-month status post left ankle arthrodesis.  She comes in today for x-rays and repeat examination.  She did have an abrasion with some erythema on her toe at the last visit from where the cast was removed.  She feels she is doing well she states she has been nonweightbearing  Assessment & Plan: Visit Diagnoses:  1. Closed fracture of distal end of left femur with routine healing, unspecified fracture morphology, subsequent encounter     Plan: We will refill her pain medication we will follow-up with Dr. Sharol Given in 1 week new x-rays of her ankle should be taken.  I am hopeful she could start advancing her weightbearing then I will call her in a refill of her medication  Follow-Up Instructions: No follow-ups on file.   Ortho Exam  Patient is alert, oriented, no adenopathy, well-dressed, normal affect, normal respiratory effort. Left ankle.  And foot.  Overall significant decrease in erythema over the great toe there is still an open area with some fibrinous tissue that measures proximately 2 x 3 cm there is no surrounding cellulitis.  The wound itself is healed except for the central 4 cm portion which has dehiscence.  There is some mild foul odor.  No surrounding erythema no ascending cellulitis sutures were removed from the distal and proximal ends of the wound which were completely healed  Imaging: XR Ankle Complete Left  Result Date: 07/26/2020 The left ankle demonstrate overall well-maintained alignment she has had some bony bridging and callusing around the hardware and fracture and fusion sites  No images are attached to the encounter.  Labs: Lab Results   Component Value Date   HGBA1C 8.7 (H) 06/02/2020   HGBA1C 7.6 (H) 10/18/2019   HGBA1C 7.2 (H) 09/10/2019   ESRSEDRATE 45 (H) 09/10/2019   CRP 5.0 (H) 09/10/2019   LABURIC 5.7 05/27/2019   REPTSTATUS 11/13/2019 FINAL 11/12/2019   REPTSTATUS 11/17/2019 FINAL 11/12/2019   GRAMSTAIN  11/12/2019    WBC PRESENT,BOTH PMN AND MONONUCLEAR NO ORGANISMS SEEN CYTOSPIN SMEAR Performed at [facility], [place] [address] [place], [place] [address]    CULT  11/12/2019    NO GROWTH 5 DAYS Performed at [facility] [address], [place], [place] [address]    LABORGA ESCHERICHIA COLI (A) 08/26/2018     Lab Results  Component Value Date   ALBUMIN 2.4 (L) 06/03/2020   ALBUMIN 3.9 12/15/2019   ALBUMIN 2.5 (L) 10/19/2019   LABURIC 5.7 05/27/2019    Lab Results  Component Value Date   MG 1.8 08/24/2019   MG 1.7 08/23/2019   MG 1.7 08/09/2019   Lab Results  Component Value Date   VD25OH 8.82 (L) 05/27/2019    No results found for: PREALBUMIN CBC EXTENDED Latest Ref Rng & Units 06/23/2020 06/03/2020 06/01/2020  WBC 4.0 - 10.5 K/uL - 7.0 -  RBC 3.87 - 5.11 MIL/uL - 2.05(L) -  HGB 12.0 - 15.0 g/dL 9.9(L) 6.4(LL) 9.9(L)  HCT  36.0 - 46.0 % 29.0(L) 20.4(L) 29.0(L)  PLT 150 - 400 K/uL - 184 -  NEUTROABS 1.7 - 7.7 K/uL - - -  LYMPHSABS 0.7 - 4.0 K/uL - - -     There is no height or weight on file to calculate BMI.  Orders:  Orders Placed This Encounter  Procedures  . XR Ankle Complete Left   No orders of the defined types were placed in this encounter.    Procedures: No procedures performed  Clinical Data: No additional findings.  ROS:  All other systems negative, except as noted in the HPI. Review of Systems  Objective: Vital Signs: LMP 12/17/2016   Specialty Comments:  No specialty comments available.  PMFS History: Patient Active Problem List   Diagnosis Date Noted  . Non-intractable vomiting 06/16/2020  . Encounter for support and coordination of  transition of care 06/16/2020  . S/P ankle fusion 06/01/2020  . Pilon fracture of left tibia, closed, initial encounter   . Displaced trimalleolar fracture of left lower leg, initial encounter for closed fracture   . Abnormality of gait and mobility 05/26/2020  . Migraine 05/26/2020  . Diabetic retinopathy ([place]) 05/04/2020  . Chronic diarrhea of unknown origin 01/14/2020  . Type 2 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, with long-term current use of insulin ([place]) 12/29/2019  . Hypothyroidism 12/29/2019  . Weakness of both lower extremities 11/12/2019  . Debility 09/10/2019  . ESRD (end stage renal disease) on dialysis ([place]) 09/10/2019  . Depression 09/10/2019  . Incontinence in female 09/10/2019  . Anemia in chronic kidney disease 08/19/2019  . MGUS (monoclonal gammopathy of unknown significance) 07/23/2019  . GERD (gastroesophageal reflux disease) 02/28/2019  . Essential hypertension 10/28/2018  . Irregular menses 09/14/2009  . Hypercholesterolemia 11/19/2008  . Type 2 diabetes with nephropathy ([place]) 07/02/2007   Past Medical History:  Diagnosis Date  . Acute cystitis without hematuria   . Acute midline thoracic back pain 12/29/2019  . Acute systolic heart failure ([place]) 10/21/2019  . Anemia   . Arthritis   . Cataracts, bilateral    surgery to remove  . CATARACTS, BILATERAL 07/02/2007   Qualifier: Diagnosis of  By: Isla Pence    . Closed fracture of left femur ([place]) 10/28/2018  . Closed fracture of right ankle 11/06/2017  . Diabetes mellitus    type 2  . Emphysematous cystitis 08/26/2018  . Encounter for gynecological examination with Papanicolaou smear of cervix 01/14/2020  . Encounter for screening fecal occult blood testing 01/14/2020  . Encounter for screening for malignant neoplasm of cervix 12/29/2019  . Encounter for screening mammogram for malignant neoplasm of breast 12/29/2019  . ESRD on hemodialysis ([place])    MWF - in [place]  . GERD  (gastroesophageal reflux disease)   . Hyperlipidemia   . Hypertension   . Hypokalemia 08/26/2018  . IRREGULAR MENSES 09/14/2009   Qualifier: Diagnosis of  By: Hassell Done FNP, Tori Milks    . Loose stools 11/12/2019  . Normocytic anemia 08/26/2018  . PARONYCHIA, RIGHT GREAT TOE 07/30/2008   Qualifier: Diagnosis of  By: Hassell Done FNP, Tori Milks    . Pressure ulcer 09/10/2019  . Right arm weakness 08/08/2019  . Sprain of left ankle   . STEMI (ST elevation myocardial infarction) ([place] [facility]) 10/18/2019  . STEMI involving right coronary artery ([place]) 10/18/2019  . Stroke ([place]) 10/2019  . SVD (spontaneous vaginal delivery)    x 4  . Vaginosis 08/26/2018  . Weakness 09/10/2019  . Weakness of both lower  extremities     Family History  Problem Relation Age of Onset  . Diabetes Mother   . Hypertension Mother   . Bipolar disorder Mother   . Brain cancer Maternal Aunt   . Heart disease Maternal Grandmother   . Diabetes Maternal Grandmother   . Breast cancer Maternal Aunt   . Depression Father        Committed suicide  . Diabetes Sister     Past Surgical History:  Procedure Laterality Date  . A/V FISTULAGRAM Left 01/12/2020   Procedure: A/V FISTULAGRAM;  Surgeon: Serafina Mitchell, MD;  Location: [facility];  Service: Cardiovascular;  Laterality: Left;  . A/V FISTULAGRAM N/A 03/17/2020   Procedure: A/V FISTULAGRAM - Left Arm;  Surgeon: Marty Heck, MD;  Location: [facility];  Service: Cardiovascular;  Laterality: N/A;  . A/V FISTULAGRAM Left 06/23/2020   Procedure: A/V FISTULAGRAM;  Surgeon: Marty Heck, MD;  Location: [facility];  Service: Cardiovascular;  Laterality: Left;  . ANKLE FUSION Left 06/01/2020   Procedure: LEFT ANKLE FUSION;  Surgeon: Newt Minion, MD;  Location: [facility];  Service: Orthopedics;  Laterality: Left;  . AV FISTULA PLACEMENT Left 08/17/2019   Procedure: LEFT BRACHIAL CEPHALIC ARTERIOVENOUS (AV) FISTULA;  Surgeon: Angelia Mould, MD;  Location: [facility];  Service: Vascular;  Laterality: Left;  . CORONARY STENT INTERVENTION N/A 10/18/2019   Procedure: CORONARY STENT INTERVENTION;  Surgeon: Sherren Mocha, MD;  Location: [facility];  Service: Cardiovascular;  Laterality: N/A;  . CORONARY/GRAFT ACUTE MI REVASCULARIZATION N/A 10/18/2019   Procedure: Coronary/Graft Acute MI Revascularization;  Surgeon: Sherren Mocha, MD;  Location: [facility];  Service: Cardiovascular;  Laterality: N/A;  . EYE SURGERY Bilateral    cataracts removed  . FEMUR IM NAIL Left 10/28/2018   Procedure: RETROGRADE FEMORAL NAILING;  Surgeon: Meredith Pel, MD;  Location: [facility];  Service: Orthopedics;  Laterality: Left;  . IM NAILING FEMORAL SHAFT RETROGRADE Left 10/28/2018  . INTRAVASCULAR ULTRASOUND/IVUS N/A 10/18/2019   Procedure: Intravascular Ultrasound/IVUS;  Surgeon: Sherren Mocha, MD;  Location: [facility];  Service: Cardiovascular;  Laterality: N/A;  . IR FLUORO GUIDE CV LINE RIGHT  08/11/2019  . IR THORACENTESIS ASP PLEURAL SPACE W/IMG GUIDE  11/12/2019  . IR US GUIDE VASC ACCESS RIGHT  08/11/2019  . KNEE SURGERY Left   . LEFT HEART CATH AND CORONARY ANGIOGRAPHY N/A 10/18/2019   Procedure: LEFT HEART CATH AND CORONARY ANGIOGRAPHY;  Surgeon: Sherren Mocha, MD;  Location: [facility];  Service: Cardiovascular;  Laterality: N/A;  . PERIPHERAL VASCULAR BALLOON ANGIOPLASTY Left 01/12/2020   Procedure: PERIPHERAL VASCULAR BALLOON ANGIOPLASTY;  Surgeon: Serafina Mitchell, MD;  Location: [facility];  Service: Cardiovascular;  Laterality: Left;  AVF  . PERIPHERAL VASCULAR BALLOON ANGIOPLASTY Left 03/17/2020   Procedure: PERIPHERAL VASCULAR BALLOON ANGIOPLASTY;  Surgeon: Marty Heck, MD;  Location: [facility];  Service: Cardiovascular;  Laterality: Left;  AVF  . PERIPHERAL VASCULAR BALLOON ANGIOPLASTY Left 05/05/2020   Procedure: PERIPHERAL VASCULAR BALLOON ANGIOPLASTY;  Surgeon: Marty Heck, MD;  Location: [facility];  Service: Cardiovascular;  Laterality: Left;  arm fistula  . RADIOLOGY WITH ANESTHESIA N/A 09/15/2019   Procedure: [facility] AND LUMBER LOWER BACK PAIN;  Surgeon: Radiologist, Medication, MD;  Location: [facility];  Service: Radiology;  Laterality: N/A;  . TUBAL LIGATION     Social History   Occupational History  .  Not on file  Tobacco Use  . Smoking status: Former Smoker    Packs/day: 0.25    Years: 2.00    Pack years: 0.50    Types: Cigarettes    Quit date: 1997    Years since quitting: 25.0  . Smokeless tobacco: Never Used  Vaping Use  . Vaping Use: Never used  Substance and Sexual Activity  . Alcohol use: No  . Drug use: No  . Sexual activity: Yes    Birth control/protection: Surgical    Comment: tubal

## 2020-08-01 DIAGNOSIS — N2581 Secondary hyperparathyroidism of renal origin: Secondary | ICD-10-CM | POA: Diagnosis not present

## 2020-08-02 ENCOUNTER — Ambulatory Visit (INDEPENDENT_AMBULATORY_CARE_PROVIDER_SITE_OTHER): Payer: Medicaid Other | Admitting: Physician Assistant

## 2020-08-02 ENCOUNTER — Ambulatory Visit (INDEPENDENT_AMBULATORY_CARE_PROVIDER_SITE_OTHER): Payer: Medicaid Other

## 2020-08-02 ENCOUNTER — Encounter: Payer: Self-pay | Admitting: Physician Assistant

## 2020-08-02 DIAGNOSIS — M25572 Pain in left ankle and joints of left foot: Secondary | ICD-10-CM

## 2020-08-02 NOTE — Progress Notes (Signed)
Office Visit Note   Patient: Melanie Hall           Date of Birth: [birth year]           MRN: [id number] Visit Date: 08/02/2020              Requested by: Perlie Mayo, NP [address] [place],  [place] [address] PCP: Perlie Mayo, NP  Chief Complaint  Patient presents with  . Left Ankle - Routine Post Op    06/01/20 left ankle fusion       HPI: Patient presents today she is 2 months status post left ankle fusion.  At her last visit a week ago she was told to be in her fracture boot.  She comes in today in a bedroom slipper.  She is putting partial weight on her foot and her foot falls into varus.  Assessment & Plan: Visit Diagnoses:  1. Pain in left ankle and joints of left foot     Plan: Emphasized the importance that she really should only be using weight for transfers in a fracture boot.  She understands again that she is at high risk for infection nonunion and amputation if she cannot be compliant with weightbearing restrictions and wearing a boot she will follow-up in 2 weeks specifically with Dr. Sharol Given x-rays of her ankle should be taken at that time  Follow-Up Instructions: No follow-ups on file.   Ortho Exam  Patient is alert, oriented, no adenopathy, well-dressed, normal affect, normal respiratory effort. Examination remaining surgical sutures were removed today overall the wound is fairly well-healed she is got some eschar in the central portion but no drainage no surrounding cellulitis.  Compartments are soft and nontender.  She does fall into varus  Imaging: No results found. No images are attached to the encounter.  Labs: Lab Results  Component Value Date   HGBA1C 8.7 (H) 06/02/2020   HGBA1C 7.6 (H) 10/18/2019   HGBA1C 7.2 (H) 09/10/2019   ESRSEDRATE 45 (H) 09/10/2019   CRP 5.0 (H) 09/10/2019   LABURIC 5.7 05/27/2019   REPTSTATUS 11/13/2019 FINAL 11/12/2019   REPTSTATUS 11/17/2019 FINAL 11/12/2019   GRAMSTAIN  11/12/2019    WBC PRESENT,BOTH PMN AND  MONONUCLEAR NO ORGANISMS SEEN CYTOSPIN SMEAR Performed at [facility], [place] [address] [place], [place] [address]    CULT  11/12/2019    NO GROWTH 5 DAYS Performed at [facility] [address] [place], [place] [address]    LABORGA ESCHERICHIA COLI (A) 08/26/2018     Lab Results  Component Value Date   ALBUMIN 2.4 (L) 06/03/2020   ALBUMIN 3.9 12/15/2019   ALBUMIN 2.5 (L) 10/19/2019   LABURIC 5.7 05/27/2019    Lab Results  Component Value Date   MG 1.8 08/24/2019   MG 1.7 08/23/2019   MG 1.7 08/09/2019   Lab Results  Component Value Date   VD25OH 8.82 (L) 05/27/2019    No results found for: PREALBUMIN CBC EXTENDED Latest Ref Rng & Units 06/23/2020 06/03/2020 06/01/2020  WBC 4.0 - 10.5 K/uL - 7.0 -  RBC 3.87 - 5.11 MIL/uL - 2.05(L) -  HGB 12.0 - 15.0 g/dL 9.9(L) 6.4(LL) 9.9(L)  HCT 36.0 - 46.0 % 29.0(L) 20.4(L) 29.0(L)  PLT 150 - 400 K/uL - 184 -  NEUTROABS 1.7 - 7.7 K/uL - - -  LYMPHSABS 0.7 - 4.0 K/uL - - -     There is no height or weight on file to  calculate BMI.  Orders:  Orders Placed This Encounter  Procedures  . XR Ankle Complete Left   No orders of the defined types were placed in this encounter.    Procedures: No procedures performed  Clinical Data: No additional findings.  ROS:  All other systems negative, except as noted in the HPI. Review of Systems  Objective: Vital Signs: LMP 12/17/2016   Specialty Comments:  No specialty comments available.  PMFS History: Patient Active Problem List   Diagnosis Date Noted  . Non-intractable vomiting 06/16/2020  . Encounter for support and coordination of transition of care 06/16/2020  . S/P ankle fusion 06/01/2020  . Pilon fracture of left tibia, closed, initial encounter   . Displaced trimalleolar fracture of left lower leg, initial encounter for closed fracture   . Abnormality of gait and mobility 05/26/2020  . Migraine 05/26/2020  . Diabetic retinopathy ([place]) 05/04/2020   . Chronic diarrhea of unknown origin 01/14/2020  . Type 2 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, with long-term current use of insulin ([place]) 12/29/2019  . Hypothyroidism 12/29/2019  . Weakness of both lower extremities 11/12/2019  . Debility 09/10/2019  . ESRD (end stage renal disease) on dialysis ([place]) 09/10/2019  . Depression 09/10/2019  . Incontinence in female 09/10/2019  . Anemia in chronic kidney disease 08/19/2019  . MGUS (monoclonal gammopathy of unknown significance) 07/23/2019  . GERD (gastroesophageal reflux disease) 02/28/2019  . Essential hypertension 10/28/2018  . Irregular menses 09/14/2009  . Hypercholesterolemia 11/19/2008  . Type 2 diabetes with nephropathy ([place]) 07/02/2007   Past Medical History:  Diagnosis Date  . Acute cystitis without hematuria   . Acute midline thoracic back pain 12/29/2019  . Acute systolic heart failure ([place]) 10/21/2019  . Anemia   . Arthritis   . Cataracts, bilateral    surgery to remove  . CATARACTS, BILATERAL 07/02/2007   Qualifier: Diagnosis of  By: Isla Pence    . Closed fracture of left femur ([place]) 10/28/2018  . Closed fracture of right ankle 11/06/2017  . Diabetes mellitus    type 2  . Emphysematous cystitis 08/26/2018  . Encounter for gynecological examination with Papanicolaou smear of cervix 01/14/2020  . Encounter for screening fecal occult blood testing 01/14/2020  . Encounter for screening for malignant neoplasm of cervix 12/29/2019  . Encounter for screening mammogram for malignant neoplasm of breast 12/29/2019  . ESRD on hemodialysis ([place])    MWF - in [place]  . GERD (gastroesophageal reflux disease)   . Hyperlipidemia   . Hypertension   . Hypokalemia 08/26/2018  . IRREGULAR MENSES 09/14/2009   Qualifier: Diagnosis of  By: Hassell Done FNP, Tori Milks    . Loose stools 11/12/2019  . Normocytic anemia 08/26/2018  . PARONYCHIA, RIGHT GREAT TOE 07/30/2008   Qualifier: Diagnosis of  By: Hassell Done FNP,  Tori Milks    . Pressure ulcer 09/10/2019  . Right arm weakness 08/08/2019  . Sprain of left ankle   . STEMI (ST elevation myocardial infarction) ([place]) 10/18/2019  . STEMI involving right coronary artery ([place]) 10/18/2019  . Stroke ([place]) 10/2019  . SVD (spontaneous vaginal delivery)    x 4  . Vaginosis 08/26/2018  . Weakness 09/10/2019  . Weakness of both lower extremities     Family History  Problem Relation Age of Onset  . Diabetes Mother   . Hypertension Mother   . Bipolar disorder Mother   . Brain cancer Maternal Aunt   . Heart disease Maternal Grandmother   . Diabetes Maternal  Grandmother   . Breast cancer Maternal Aunt   . Depression Father        Committed suicide  . Diabetes Sister     Past Surgical History:  Procedure Laterality Date  . A/V FISTULAGRAM Left 01/12/2020   Procedure: A/V FISTULAGRAM;  Surgeon: Serafina Mitchell, MD;  Location: [facility];  Service: Cardiovascular;  Laterality: Left;  . A/V FISTULAGRAM N/A 03/17/2020   Procedure: A/V FISTULAGRAM - Left Arm;  Surgeon: Marty Heck, MD;  Location: [facility];  Service: Cardiovascular;  Laterality: N/A;  . A/V FISTULAGRAM Left 06/23/2020   Procedure: A/V FISTULAGRAM;  Surgeon: Marty Heck, MD;  Location: [facility];  Service: Cardiovascular;  Laterality: Left;  . ANKLE FUSION Left 06/01/2020   Procedure: LEFT ANKLE FUSION;  Surgeon: Newt Minion, MD;  Location: [facility];  Service: Orthopedics;  Laterality: Left;  . AV FISTULA PLACEMENT Left 08/17/2019   Procedure: LEFT BRACHIAL CEPHALIC ARTERIOVENOUS (AV) FISTULA;  Surgeon: Angelia Mould, MD;  Location: [facility];  Service: Vascular;  Laterality: Left;  . CORONARY STENT INTERVENTION N/A 10/18/2019   Procedure: CORONARY STENT INTERVENTION;  Surgeon: Sherren Mocha, MD;  Location: [facility];  Service: Cardiovascular;  Laterality: N/A;  . CORONARY/GRAFT ACUTE MI REVASCULARIZATION N/A 10/18/2019   Procedure: Coronary/Graft  Acute MI Revascularization;  Surgeon: Sherren Mocha, MD;  Location: [facility];  Service: Cardiovascular;  Laterality: N/A;  . EYE SURGERY Bilateral    cataracts removed  . FEMUR IM NAIL Left 10/28/2018   Procedure: RETROGRADE FEMORAL NAILING;  Surgeon: Meredith Pel, MD;  Location: [facility];  Service: Orthopedics;  Laterality: Left;  . IM NAILING FEMORAL SHAFT RETROGRADE Left 10/28/2018  . INTRAVASCULAR ULTRASOUND/IVUS N/A 10/18/2019   Procedure: Intravascular Ultrasound/IVUS;  Surgeon: Sherren Mocha, MD;  Location: [facility];  Service: Cardiovascular;  Laterality: N/A;  . IR FLUORO GUIDE CV LINE RIGHT  08/11/2019  . IR THORACENTESIS ASP PLEURAL SPACE W/IMG GUIDE  11/12/2019  . IR US GUIDE VASC ACCESS RIGHT  08/11/2019  . KNEE SURGERY Left   . LEFT HEART CATH AND CORONARY ANGIOGRAPHY N/A 10/18/2019   Procedure: LEFT HEART CATH AND CORONARY ANGIOGRAPHY;  Surgeon: Sherren Mocha, MD;  Location: [facility];  Service: Cardiovascular;  Laterality: N/A;  . PERIPHERAL VASCULAR BALLOON ANGIOPLASTY Left 01/12/2020   Procedure: PERIPHERAL VASCULAR BALLOON ANGIOPLASTY;  Surgeon: Serafina Mitchell, MD;  Location: [facility];  Service: Cardiovascular;  Laterality: Left;  AVF  . PERIPHERAL VASCULAR BALLOON ANGIOPLASTY Left 03/17/2020   Procedure: PERIPHERAL VASCULAR BALLOON ANGIOPLASTY;  Surgeon: Marty Heck, MD;  Location: [facility];  Service: Cardiovascular;  Laterality: Left;  AVF  . PERIPHERAL VASCULAR BALLOON ANGIOPLASTY Left 05/05/2020   Procedure: PERIPHERAL VASCULAR BALLOON ANGIOPLASTY;  Surgeon: Marty Heck, MD;  Location: [facility];  Service: Cardiovascular;  Laterality: Left;  arm fistula  . RADIOLOGY WITH ANESTHESIA N/A 09/15/2019   Procedure: [facility] AND LUMBER LOWER BACK PAIN;  Surgeon: Radiologist, Medication, MD;  Location: [facility];  Service: Radiology;  Laterality: N/A;  . TUBAL LIGATION     Social History   Occupational History   . Not on file  Tobacco Use  . Smoking status: Former Smoker    Packs/day: 0.25    Years: 2.00    Pack years: 0.50    Types: Cigarettes    Quit date: 1997    Years since quitting: 25.0  .  Smokeless tobacco: Never Used  Vaping Use  . Vaping Use: Never used  Substance and Sexual Activity  . Alcohol use: No  . Drug use: No  . Sexual activity: Yes    Birth control/protection: Surgical    Comment: tubal

## 2020-08-08 DIAGNOSIS — Z992 Dependence on renal dialysis: Secondary | ICD-10-CM | POA: Diagnosis not present

## 2020-08-08 DIAGNOSIS — N186 End stage renal disease: Secondary | ICD-10-CM | POA: Diagnosis not present

## 2020-08-10 DIAGNOSIS — Z992 Dependence on renal dialysis: Secondary | ICD-10-CM | POA: Diagnosis not present

## 2020-08-10 DIAGNOSIS — N186 End stage renal disease: Secondary | ICD-10-CM | POA: Diagnosis not present

## 2020-08-16 ENCOUNTER — Ambulatory Visit: Payer: Medicaid Other | Admitting: Orthopedic Surgery

## 2020-08-23 ENCOUNTER — Ambulatory Visit: Payer: Self-pay

## 2020-08-23 ENCOUNTER — Ambulatory Visit (INDEPENDENT_AMBULATORY_CARE_PROVIDER_SITE_OTHER): Payer: Medicaid Other | Admitting: Physician Assistant

## 2020-08-23 ENCOUNTER — Encounter: Payer: Self-pay | Admitting: Orthopedic Surgery

## 2020-08-23 DIAGNOSIS — M25572 Pain in left ankle and joints of left foot: Secondary | ICD-10-CM

## 2020-08-23 MED ORDER — OXYCODONE-ACETAMINOPHEN 5-325 MG PO TABS: 1.0000 | ORAL_TABLET | Freq: Three times a day (TID) | ORAL | 0 refills | Status: DC | PRN

## 2020-08-23 NOTE — Progress Notes (Signed)
Office Visit Note   Patient: Melanie Hall           Date of Birth: Nov 29, 1970           MRN: [id number] Visit Date: 08/23/2020              Requested by: Perlie Mayo, NP [address] [place],  [place] [address] PCP: Perlie Mayo, NP  Chief Complaint  Patient presents with  . Left Ankle - Routine Post Op    06/01/20 left ankle fusion       HPI: Patient is 2-1/42-month status post left ankle fusion.  She still has some pain.  She has been not placing weight on this.  Her biggest complaint is of swelling in her left foot and ankle.  She has had swelling chronically because of previous history of trauma to her left leg  Assessment & Plan: Visit Diagnoses:  1. Pain in left ankle and joints of left foot     Plan: She was placed in a large 20/30 compression stocking today.  I explained to her it is important that she get all of the wrinkles out of this.  She can wash this and alternate it with a Ace wrap.  She will follow-up in 3 weeks.  Follow-Up Instructions: No follow-ups on file.   Ortho Exam  Patient is alert, oriented, no adenopathy, well-dressed, normal affect, normal respiratory effort. Moderate soft tissue swelling but no erythema or cellulitis.  Her compartments are soft and nontender.  Incision for the most part has healed.  She has a eschar across 1 area of the wound but there is no surrounding erythema or drainage.  No ascending cellulitis  Imaging: No results found. No images are attached to the encounter.  Labs: Lab Results  Component Value Date   HGBA1C 8.7 (H) 06/02/2020   HGBA1C 7.6 (H) 10/18/2019   HGBA1C 7.2 (H) 09/10/2019   ESRSEDRATE 45 (H) 09/10/2019   CRP 5.0 (H) 09/10/2019   LABURIC 5.7 05/27/2019   REPTSTATUS 11/13/2019 FINAL 11/12/2019   REPTSTATUS 11/17/2019 FINAL 11/12/2019   GRAMSTAIN  11/12/2019    WBC PRESENT,BOTH PMN AND MONONUCLEAR NO ORGANISMS SEEN CYTOSPIN SMEAR Performed at [facility], [place] [address] [place],  [place] [address]    CULT  11/12/2019    NO GROWTH 5 DAYS Performed at [facility] [address], [place], [place] [address]    LABORGA ESCHERICHIA COLI (A) 08/26/2018     Lab Results  Component Value Date   ALBUMIN 2.4 (L) 06/03/2020   ALBUMIN 3.9 12/15/2019   ALBUMIN 2.5 (L) 10/19/2019   LABURIC 5.7 05/27/2019    Lab Results  Component Value Date   MG 1.8 08/24/2019   MG 1.7 08/23/2019   MG 1.7 08/09/2019   Lab Results  Component Value Date   VD25OH 8.82 (L) 05/27/2019    No results found for: PREALBUMIN CBC EXTENDED Latest Ref Rng & Units 06/23/2020 06/03/2020 06/01/2020  WBC 4.0 - 10.5 K/uL - 7.0 -  RBC 3.87 - 5.11 MIL/uL - 2.05(L) -  HGB 12.0 - 15.0 g/dL 9.9(L) 6.4(LL) 9.9(L)  HCT 36.0 - 46.0 % 29.0(L) 20.4(L) 29.0(L)  PLT 150 - 400 K/uL - 184 -  NEUTROABS 1.7 - 7.7 K/uL - - -  LYMPHSABS 0.7 - 4.0 K/uL - - -     There is no height or weight on file to calculate BMI.  Orders:  Orders Placed This Encounter  Procedures  .  XR Ankle Complete Left   Meds ordered this encounter  Medications  . oxyCODONE-acetaminophen (PERCOCET/ROXICET) 5-325 MG tablet    Sig: Take 1 tablet by mouth every 8 (eight) hours as needed for severe pain.    Dispense:  20 tablet    Refill:  0     Procedures: No procedures performed  Clinical Data: No additional findings.  ROS:  All other systems negative, except as noted in the HPI. Review of Systems  Objective: Vital Signs: LMP 12/17/2016   Specialty Comments:  No specialty comments available.  PMFS History: Patient Active Problem List   Diagnosis Date Noted  . Non-intractable vomiting 06/16/2020  . Encounter for support and coordination of transition of care 06/16/2020  . S/P ankle fusion 06/01/2020  . Pilon fracture of left tibia, closed, initial encounter   . Displaced trimalleolar fracture of left lower leg, initial encounter for closed fracture   . Abnormality of gait and mobility 05/26/2020  . Migraine  05/26/2020  . Diabetic retinopathy ([place]) 05/04/2020  . Chronic diarrhea of unknown origin 01/14/2020  . Type 2 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, with long-term current use of insulin ([place]) 12/29/2019  . Hypothyroidism 12/29/2019  . Weakness of both lower extremities 11/12/2019  . Debility 09/10/2019  . ESRD (end stage renal disease) on dialysis ([facility]) 09/10/2019  . Depression 09/10/2019  . Incontinence in female 09/10/2019  . Anemia in chronic kidney disease 08/19/2019  . MGUS (monoclonal gammopathy of unknown significance) 07/23/2019  . GERD (gastroesophageal reflux disease) 02/28/2019  . Essential hypertension 10/28/2018  . Irregular menses 09/14/2009  . Hypercholesterolemia 11/19/2008  . Type 2 diabetes with nephropathy ([facility]) 07/02/2007   Past Medical History:  Diagnosis Date  . Acute cystitis without hematuria   . Acute midline thoracic back pain 12/29/2019  . Acute systolic heart failure ([place] [facility]) 10/21/2019  . Anemia   . Arthritis   . Cataracts, bilateral    surgery to remove  . CATARACTS, BILATERAL 07/02/2007   Qualifier: Diagnosis of  By: Isla Pence    . Closed fracture of left femur ([facility]) 10/28/2018  . Closed fracture of right ankle 11/06/2017  . Diabetes mellitus    type 2  . Emphysematous cystitis 08/26/2018  . Encounter for gynecological examination with Papanicolaou smear of cervix 01/14/2020  . Encounter for screening fecal occult blood testing 01/14/2020  . Encounter for screening for malignant neoplasm of cervix 12/29/2019  . Encounter for screening mammogram for malignant neoplasm of breast 12/29/2019  . ESRD on hemodialysis ([facility])    MWF - in [place]  . GERD (gastroesophageal reflux disease)   . Hyperlipidemia   . Hypertension   . Hypokalemia 08/26/2018  . IRREGULAR MENSES 09/14/2009   Qualifier: Diagnosis of  By: Hassell Done FNP, Tori Milks    . Loose stools 11/12/2019  . Normocytic anemia 08/26/2018  . PARONYCHIA, RIGHT GREAT TOE  07/30/2008   Qualifier: Diagnosis of  By: Hassell Done FNP, Tori Milks    . Pressure ulcer 09/10/2019  . Right arm weakness 08/08/2019  . Sprain of left ankle   . STEMI (ST elevation myocardial infarction) ([facility]) 10/18/2019  . STEMI involving right coronary artery ([place] [facility]) 10/18/2019  . Stroke ([facility]) 10/2019  . SVD (spontaneous vaginal delivery)    x 4  . Vaginosis 08/26/2018  . Weakness 09/10/2019  . Weakness of both lower extremities     Family History  Problem Relation Age of Onset  . Diabetes Mother   . Hypertension Mother   . Bipolar  disorder Mother   . Brain cancer Maternal Aunt   . Heart disease Maternal Grandmother   . Diabetes Maternal Grandmother   . Breast cancer Maternal Aunt   . Depression Father        Committed suicide  . Diabetes Sister     Past Surgical History:  Procedure Laterality Date  . A/V FISTULAGRAM Left 01/12/2020   Procedure: A/V FISTULAGRAM;  Surgeon: Serafina Mitchell, MD;  Location: [facility];  Service: Cardiovascular;  Laterality: Left;  . A/V FISTULAGRAM N/A 03/17/2020   Procedure: A/V FISTULAGRAM - Left Arm;  Surgeon: Marty Heck, MD;  Location: [facility];  Service: Cardiovascular;  Laterality: N/A;  . A/V FISTULAGRAM Left 06/23/2020   Procedure: A/V FISTULAGRAM;  Surgeon: Marty Heck, MD;  Location: [facility];  Service: Cardiovascular;  Laterality: Left;  . ANKLE FUSION Left 06/01/2020   Procedure: LEFT ANKLE FUSION;  Surgeon: Newt Minion, MD;  Location: [facility];  Service: Orthopedics;  Laterality: Left;  . AV FISTULA PLACEMENT Left 08/17/2019   Procedure: LEFT BRACHIAL CEPHALIC ARTERIOVENOUS (AV) FISTULA;  Surgeon: Angelia Mould, MD;  Location: [facility];  Service: Vascular;  Laterality: Left;  . CORONARY STENT INTERVENTION N/A 10/18/2019   Procedure: CORONARY STENT INTERVENTION;  Surgeon: Sherren Mocha, MD;  Location: [facility];  Service: Cardiovascular;  Laterality: N/A;  . CORONARY/GRAFT ACUTE MI  REVASCULARIZATION N/A 10/18/2019   Procedure: Coronary/Graft Acute MI Revascularization;  Surgeon: Sherren Mocha, MD;  Location: [facility];  Service: Cardiovascular;  Laterality: N/A;  . EYE SURGERY Bilateral    cataracts removed  . FEMUR IM NAIL Left 10/28/2018   Procedure: RETROGRADE FEMORAL NAILING;  Surgeon: Meredith Pel, MD;  Location: [facility];  Service: Orthopedics;  Laterality: Left;  . IM NAILING FEMORAL SHAFT RETROGRADE Left 10/28/2018  . INTRAVASCULAR ULTRASOUND/IVUS N/A 10/18/2019   Procedure: Intravascular Ultrasound/IVUS;  Surgeon: Sherren Mocha, MD;  Location: [facility];  Service: Cardiovascular;  Laterality: N/A;  . IR FLUORO GUIDE CV LINE RIGHT  08/11/2019  . IR THORACENTESIS ASP PLEURAL SPACE W/IMG GUIDE  11/12/2019  . IR US GUIDE VASC ACCESS RIGHT  08/11/2019  . KNEE SURGERY Left   . LEFT HEART CATH AND CORONARY ANGIOGRAPHY N/A 10/18/2019   Procedure: LEFT HEART CATH AND CORONARY ANGIOGRAPHY;  Surgeon: Sherren Mocha, MD;  Location: [facility];  Service: Cardiovascular;  Laterality: N/A;  . PERIPHERAL VASCULAR BALLOON ANGIOPLASTY Left 01/12/2020   Procedure: PERIPHERAL VASCULAR BALLOON ANGIOPLASTY;  Surgeon: Serafina Mitchell, MD;  Location: [facility];  Service: Cardiovascular;  Laterality: Left;  AVF  . PERIPHERAL VASCULAR BALLOON ANGIOPLASTY Left 03/17/2020   Procedure: PERIPHERAL VASCULAR BALLOON ANGIOPLASTY;  Surgeon: Marty Heck, MD;  Location: [facility];  Service: Cardiovascular;  Laterality: Left;  AVF  . PERIPHERAL VASCULAR BALLOON ANGIOPLASTY Left 05/05/2020   Procedure: PERIPHERAL VASCULAR BALLOON ANGIOPLASTY;  Surgeon: Marty Heck, MD;  Location: [facility];  Service: Cardiovascular;  Laterality: Left;  arm fistula  . RADIOLOGY WITH ANESTHESIA N/A 09/15/2019   Procedure: [facility] AND LUMBER LOWER BACK PAIN;  Surgeon: Radiologist, Medication, MD;  Location: [facility];  Service: Radiology;  Laterality: N/A;  .  TUBAL LIGATION     Social History   Occupational History  . Not on file  Tobacco Use  . Smoking status: Former Smoker    Packs/day: 0.25    Years: 2.00    Pack  years: 0.50    Types: Cigarettes    Quit date: 53    Years since quitting: 25.1  . Smokeless tobacco: Never Used  Vaping Use  . Vaping Use: Never used  Substance and Sexual Activity  . Alcohol use: No  . Drug use: No  . Sexual activity: Yes    Birth control/protection: Surgical    Comment: tubal

## 2020-09-05 DIAGNOSIS — Z992 Dependence on renal dialysis: Secondary | ICD-10-CM | POA: Diagnosis not present

## 2020-09-05 DIAGNOSIS — N186 End stage renal disease: Secondary | ICD-10-CM | POA: Diagnosis not present

## 2020-09-07 DIAGNOSIS — N2581 Secondary hyperparathyroidism of renal origin: Secondary | ICD-10-CM | POA: Diagnosis not present

## 2020-09-13 ENCOUNTER — Ambulatory Visit (INDEPENDENT_AMBULATORY_CARE_PROVIDER_SITE_OTHER): Payer: Medicaid Other | Admitting: Physician Assistant

## 2020-09-13 ENCOUNTER — Encounter: Payer: Self-pay | Admitting: Physician Assistant

## 2020-09-13 DIAGNOSIS — M25572 Pain in left ankle and joints of left foot: Secondary | ICD-10-CM

## 2020-09-13 NOTE — Progress Notes (Signed)
Office Visit Note   Patient: Melanie Hall           Date of Birth: [birth year]           MRN: [id number] Visit Date: 09/13/2020              Requested by: Perlie Mayo, NP [address] [place],  [place] [address] PCP: Perlie Mayo, NP  Chief Complaint  Patient presents with  . Left Ankle - Follow-up    06/01/20 left ankle fusion       HPI:  Patient is 3-1/53-month status post left ankle arthrodesis.  She was given compression socks at her last visit but she said she could not get them on.  She did buy a pair of ankle socks was wondering if she could use these Assessment & Plan: Visit Diagnoses: No diagnosis found.  Plan: Patient will go into a compression wrap today with silver cell over the anterior ankle wound.  We will follow-up in 1 week.  She will bring her sock with her and hopefully can apply it at that time.  Also x-rays of her ankle should be obtained at that visit Follow-Up Instructions: No follow-ups on file.   Ortho Exam  Patient is alert, oriented, no adenopathy, well-dressed, normal affect, normal respiratory effort. Examination no cellulitis no erythema but she has moderate plus soft tissue swelling in her foot going up into her ankle.  She has a quarter sized open wound.  This does not probe deeply or has surrounding cellulitis.  Minimal serous drainage.  No ascending cellulitis  Imaging: No results found. No images are attached to the encounter.  Labs: Lab Results  Component Value Date   HGBA1C 8.7 (H) 06/02/2020   HGBA1C 7.6 (H) 10/18/2019   HGBA1C 7.2 (H) 09/10/2019   ESRSEDRATE 45 (H) 09/10/2019   CRP 5.0 (H) 09/10/2019   LABURIC 5.7 05/27/2019   REPTSTATUS 11/13/2019 FINAL 11/12/2019   REPTSTATUS 11/17/2019 FINAL 11/12/2019   GRAMSTAIN  11/12/2019    WBC PRESENT,BOTH PMN AND MONONUCLEAR NO ORGANISMS SEEN CYTOSPIN SMEAR Performed at [facility], [place] [address], [place], [place] [address]    CULT  11/12/2019    NO GROWTH 5  DAYS Performed at [facility] [address] [place], [place] [address]    LABORGA ESCHERICHIA COLI (A) 08/26/2018     Lab Results  Component Value Date   ALBUMIN 2.4 (L) 06/03/2020   ALBUMIN 3.9 12/15/2019   ALBUMIN 2.5 (L) 10/19/2019   LABURIC 5.7 05/27/2019    Lab Results  Component Value Date   MG 1.8 08/24/2019   MG 1.7 08/23/2019   MG 1.7 08/09/2019   Lab Results  Component Value Date   VD25OH 8.82 (L) 05/27/2019    No results found for: PREALBUMIN CBC EXTENDED Latest Ref Rng & Units 06/23/2020 06/03/2020 06/01/2020  WBC 4.0 - 10.5 K/uL - 7.0 -  RBC 3.87 - 5.11 MIL/uL - 2.05(L) -  HGB 12.0 - 15.0 g/dL 9.9(L) 6.4(LL) 9.9(L)  HCT 36.0 - 46.0 % 29.0(L) 20.4(L) 29.0(L)  PLT 150 - 400 K/uL - 184 -  NEUTROABS 1.7 - 7.7 K/uL - - -  LYMPHSABS 0.7 - 4.0 K/uL - - -     There is no height or weight on file to calculate BMI.  Orders:  No orders of the defined types were placed in this encounter.  No orders of the defined types were placed in this encounter.  Procedures: No procedures performed  Clinical Data: No additional findings.  ROS:  All other systems negative, except as noted in the HPI. Review of Systems  Objective: Vital Signs: LMP 12/17/2016   Specialty Comments:  No specialty comments available.  PMFS History: Patient Active Problem List   Diagnosis Date Noted  . Non-intractable vomiting 06/16/2020  . Encounter for support and coordination of transition of care 06/16/2020  . S/P ankle fusion 06/01/2020  . Pilon fracture of left tibia, closed, initial encounter   . Displaced trimalleolar fracture of left lower leg, initial encounter for closed fracture   . Abnormality of gait and mobility 05/26/2020  . Migraine 05/26/2020  . Diabetic retinopathy ([place]) 05/04/2020  . Chronic diarrhea of unknown origin 01/14/2020  . Type 2 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, with long-term current use of insulin  ([place]) 12/29/2019  . Hypothyroidism 12/29/2019  . Weakness of both lower extremities 11/12/2019  . Debility 09/10/2019  . ESRD (end stage renal disease) on dialysis ([place]) 09/10/2019  . Depression 09/10/2019  . Incontinence in female 09/10/2019  . Anemia in chronic kidney disease 08/19/2019  . MGUS (monoclonal gammopathy of unknown significance) 07/23/2019  . GERD (gastroesophageal reflux disease) 02/28/2019  . Essential hypertension 10/28/2018  . Irregular menses 09/14/2009  . Hypercholesterolemia 11/19/2008  . Type 2 diabetes with nephropathy ([place]) 07/02/2007   Past Medical History:  Diagnosis Date  . Acute cystitis without hematuria   . Acute midline thoracic back pain 12/29/2019  . Acute systolic heart failure ([place]) 10/21/2019  . Anemia   . Arthritis   . Cataracts, bilateral    surgery to remove  . CATARACTS, BILATERAL 07/02/2007   Qualifier: Diagnosis of  By: Isla Pence    . Closed fracture of left femur ([place]) 10/28/2018  . Closed fracture of right ankle 11/06/2017  . Diabetes mellitus    type 2  . Emphysematous cystitis 08/26/2018  . Encounter for gynecological examination with Papanicolaou smear of cervix 01/14/2020  . Encounter for screening fecal occult blood testing 01/14/2020  . Encounter for screening for malignant neoplasm of cervix 12/29/2019  . Encounter for screening mammogram for malignant neoplasm of breast 12/29/2019  . ESRD on hemodialysis ([place])    MWF - in [place]  . GERD (gastroesophageal reflux disease)   . Hyperlipidemia   . Hypertension   . Hypokalemia 08/26/2018  . IRREGULAR MENSES 09/14/2009   Qualifier: Diagnosis of  By: Hassell Done FNP, Tori Milks    . Loose stools 11/12/2019  . Normocytic anemia 08/26/2018  . PARONYCHIA, RIGHT GREAT TOE 07/30/2008   Qualifier: Diagnosis of  By: Hassell Done FNP, Tori Milks    . Pressure ulcer 09/10/2019  . Right arm weakness 08/08/2019  . Sprain of left ankle   . STEMI (ST elevation myocardial infarction) ([place]) 10/18/2019  . STEMI  involving right coronary artery ([place]) 10/18/2019  . Stroke ([place]) 10/2019  . SVD (spontaneous vaginal delivery)    x 4  . Vaginosis 08/26/2018  . Weakness 09/10/2019  . Weakness of both lower extremities     Family History  Problem Relation Age of Onset  . Diabetes Mother   . Hypertension Mother   . Bipolar disorder Mother   . Brain cancer Maternal Aunt   . Heart disease Maternal Grandmother   . Diabetes Maternal Grandmother   . Breast cancer Maternal Aunt   . Depression Father        Committed suicide  . Diabetes Sister     Past Surgical History:  Procedure Laterality Date  . A/V FISTULAGRAM Left 01/12/2020   Procedure: A/V FISTULAGRAM;  Surgeon: Serafina [place], MD;  Location: [facility];  Service: Cardiovascular;  Laterality: Left;  . A/V FISTULAGRAM N/A 03/17/2020   Procedure: A/V FISTULAGRAM - Left Arm;  Surgeon: Marty Heck, MD;  Location: [facility];  Service: Cardiovascular;  Laterality: N/A;  . A/V FISTULAGRAM Left 06/23/2020   Procedure: A/V FISTULAGRAM;  Surgeon: Marty Heck, MD;  Location: [facility];  Service: Cardiovascular;  Laterality: Left;  . ANKLE FUSION Left 06/01/2020   Procedure: LEFT ANKLE FUSION;  Surgeon: Newt Minion, MD;  Location: [facility];  Service: Orthopedics;  Laterality: Left;  . AV FISTULA PLACEMENT Left 08/17/2019   Procedure: LEFT BRACHIAL CEPHALIC ARTERIOVENOUS (AV) FISTULA;  Surgeon: Angelia Mould, MD;  Location: [facility];  Service: Vascular;  Laterality: Left;  . CORONARY STENT INTERVENTION N/A 10/18/2019   Procedure: CORONARY STENT INTERVENTION;  Surgeon: Sherren Mocha, MD;  Location: [facility];  Service: Cardiovascular;  Laterality: N/A;  . CORONARY/GRAFT ACUTE MI REVASCULARIZATION N/A 10/18/2019   Procedure: Coronary/Graft Acute MI Revascularization;  Surgeon: Sherren Mocha, MD;  Location: [facility];  Service: Cardiovascular;  Laterality: N/A;  . EYE SURGERY Bilateral    cataracts  removed  . FEMUR IM NAIL Left 10/28/2018   Procedure: RETROGRADE FEMORAL NAILING;  Surgeon: Meredith Pel, MD;  Location: [facility];  Service: Orthopedics;  Laterality: Left;  . IM NAILING FEMORAL SHAFT RETROGRADE Left 10/28/2018  . INTRAVASCULAR ULTRASOUND/IVUS N/A 10/18/2019   Procedure: Intravascular Ultrasound/IVUS;  Surgeon: Sherren Mocha, MD;  Location: [facility];  Service: Cardiovascular;  Laterality: N/A;  . IR FLUORO GUIDE CV LINE RIGHT  08/11/2019  . IR THORACENTESIS ASP PLEURAL SPACE W/IMG GUIDE  11/12/2019  . IR US GUIDE VASC ACCESS RIGHT  08/11/2019  . KNEE SURGERY Left   . LEFT HEART CATH AND CORONARY ANGIOGRAPHY N/A 10/18/2019   Procedure: LEFT HEART CATH AND CORONARY ANGIOGRAPHY;  Surgeon: Sherren Mocha, MD;  Location: [facility];  Service: Cardiovascular;  Laterality: N/A;  . PERIPHERAL VASCULAR BALLOON ANGIOPLASTY Left 01/12/2020   Procedure: PERIPHERAL VASCULAR BALLOON ANGIOPLASTY;  Surgeon: Serafina [place], MD;  Location: [facility];  Service: Cardiovascular;  Laterality: Left;  AVF  . PERIPHERAL VASCULAR BALLOON ANGIOPLASTY Left 03/17/2020   Procedure: PERIPHERAL VASCULAR BALLOON ANGIOPLASTY;  Surgeon: Marty Heck, MD;  Location: [facility];  Service: Cardiovascular;  Laterality: Left;  AVF  . PERIPHERAL VASCULAR BALLOON ANGIOPLASTY Left 05/05/2020   Procedure: PERIPHERAL VASCULAR BALLOON ANGIOPLASTY;  Surgeon: Marty Heck, MD;  Location: [facility];  Service: Cardiovascular;  Laterality: Left;  arm fistula  . RADIOLOGY WITH ANESTHESIA N/A 09/15/2019   Procedure: [facility] AND LUMBER LOWER BACK PAIN;  Surgeon: Radiologist, Medication, MD;  Location: [facility];  Service: Radiology;  Laterality: N/A;  . TUBAL LIGATION     Social History   Occupational History  . Not on file  Tobacco Use  . Smoking status: Former Smoker    Packs/day: 0.25    Years: 2.00    Pack years: 0.50    Types: Cigarettes    Quit date: 1997    Years  since quitting: 25.1  . Smokeless tobacco: Never Used  Vaping Use  . Vaping Use: Never used  Substance and Sexual Activity  . Alcohol use: No  . Drug use: No  . Sexual activity: Yes  Birth control/protection: Surgical    Comment: tubal

## 2020-09-14 ENCOUNTER — Telehealth: Payer: Self-pay

## 2020-09-14 DIAGNOSIS — N186 End stage renal disease: Secondary | ICD-10-CM | POA: Diagnosis not present

## 2020-09-14 DIAGNOSIS — Z992 Dependence on renal dialysis: Secondary | ICD-10-CM | POA: Diagnosis not present

## 2020-09-14 NOTE — Telephone Encounter (Signed)
Patient had AVF placed in 2021 by [facility]. Has had 3 fistulograms since. Says HD is having slight difficulties sticking access and that there is a knot - denies ulceration or thin, shiny skin. Would like it checked prior to removal of TDC. Placed on schedule for dialysis duplex and follow up.

## 2020-09-19 DIAGNOSIS — Z992 Dependence on renal dialysis: Secondary | ICD-10-CM | POA: Diagnosis not present

## 2020-09-19 DIAGNOSIS — N186 End stage renal disease: Secondary | ICD-10-CM | POA: Diagnosis not present

## 2020-09-20 ENCOUNTER — Encounter: Payer: Self-pay | Admitting: Physician Assistant

## 2020-09-20 ENCOUNTER — Ambulatory Visit (INDEPENDENT_AMBULATORY_CARE_PROVIDER_SITE_OTHER): Payer: Medicaid Other | Admitting: Physician Assistant

## 2020-09-20 ENCOUNTER — Other Ambulatory Visit: Payer: Self-pay

## 2020-09-20 ENCOUNTER — Ambulatory Visit (INDEPENDENT_AMBULATORY_CARE_PROVIDER_SITE_OTHER): Payer: Medicaid Other

## 2020-09-20 DIAGNOSIS — M25572 Pain in left ankle and joints of left foot: Secondary | ICD-10-CM

## 2020-09-20 MED ORDER — OXYCODONE-ACETAMINOPHEN 5-325 MG PO TABS: 1.0000 | ORAL_TABLET | Freq: Three times a day (TID) | ORAL | 0 refills | Status: DC | PRN

## 2020-09-20 MED ORDER — DOXYCYCLINE HYCLATE 100 MG PO TABS: 100.0000 mg | ORAL_TABLET | Freq: Two times a day (BID) | ORAL | 0 refills | Status: DC

## 2020-09-20 NOTE — Progress Notes (Signed)
Office Visit Note   Patient: Melanie Hall           Date of Birth: [birth year]           MRN: [id number] Visit Date: 09/20/2020              Requested by: Perlie Mayo, NP [address] [place],  [place] [address] PCP: Perlie Mayo, NP  Chief Complaint  Patient presents with  . Left Ankle - Follow-up      HPI: Patient is in follow-up with status post tibial calcaneal fusion.  All cam walker boot.  She comes in today with her biggest concern is some purulent drainage from her heel in an area where she has had a scab.  Feels the anterior ankle is improving  Assessment & Plan: Visit Diagnoses:  1. Pain in left ankle and joints of left foot     Plan: We will place patient on PRAFO boot.  Also placed her in her compression sock which she should change daily.  Wash the wounds with antibacterial soap and water.  Follow-up in 1 week.  We will call her in some  Follow-Up Instructions: No follow-ups on file.   Ortho Exam  Patient is alert, oriented, no adenopathy, well-dressed, normal affect, normal respiratory effort. Mild to moderate soft tissue swelling of the ankle but no ascending cellulitis.  She has a 2 cm round area in the central portion of the anterior ankle.  She has hyperproliferative tissue which was touched with a silver nitrate stick.  She does not probe deeply no foul odor small amount of green drainage in the posterior heel she has 1/2 cm necrotic ulcer this is debrided with some purulence beneath.  No ascending cellulitis or surrounding cellulitis. Pain Imaging: No results found. No images are attached to the encounter.  Labs: Lab Results  Component Value Date   HGBA1C 8.7 (H) 06/02/2020   HGBA1C 7.6 (H) 10/18/2019   HGBA1C 7.2 (H) 09/10/2019   ESRSEDRATE 45 (H) 09/10/2019   CRP 5.0 (H) 09/10/2019   LABURIC 5.7 05/27/2019   REPTSTATUS 11/13/2019 FINAL 11/12/2019   REPTSTATUS 11/17/2019 FINAL 11/12/2019   GRAMSTAIN  11/12/2019    WBC PRESENT,BOTH PMN AND  MONONUCLEAR NO ORGANISMS SEEN CYTOSPIN SMEAR Performed at [facility], [place] [address], [place], [place] [address]    CULT  11/12/2019    NO GROWTH 5 DAYS Performed at [facility] [address] [place], [place] [address]    LABORGA ESCHERICHIA COLI (A) 08/26/2018     Lab Results  Component Value Date   ALBUMIN 2.4 (L) 06/03/2020   ALBUMIN 3.9 12/15/2019   ALBUMIN 2.5 (L) 10/19/2019   LABURIC 5.7 05/27/2019    Lab Results  Component Value Date   MG 1.8 08/24/2019   MG 1.7 08/23/2019   MG 1.7 08/09/2019   Lab Results  Component Value Date   VD25OH 8.82 (L) 05/27/2019    No results found for: PREALBUMIN CBC EXTENDED Latest Ref Rng & Units 06/23/2020 06/03/2020 06/01/2020  WBC 4.0 - 10.5 K/uL - 7.0 -  RBC 3.87 - 5.11 MIL/uL - 2.05(L) -  HGB 12.0 - 15.0 g/dL 9.9(L) 6.4(LL) 9.9(L)  HCT 36.0 - 46.0 % 29.0(L) 20.4(L) 29.0(L)  PLT 150 - 400 K/uL - 184 -  NEUTROABS 1.7 - 7.7 K/uL - - -  LYMPHSABS 0.7 - 4.0 K/uL - - -     There is no height or weight on file  to calculate BMI.  Orders:  Orders Placed This Encounter  Procedures  . XR Ankle Complete Left   No orders of the defined types were placed in this encounter.    Procedures: No procedures performed  Clinical Data: No additional findings.  ROS:  All other systems negative, except as noted in the HPI. Review of Systems  Objective: Vital Signs: LMP 12/17/2016   Specialty Comments:  No specialty comments available.  PMFS History: Patient Active Problem List   Diagnosis Date Noted  . Non-intractable vomiting 06/16/2020  . Encounter for support and coordination of transition of care 06/16/2020  . S/P ankle fusion 06/01/2020  . Pilon fracture of left tibia, closed, initial encounter   . Displaced trimalleolar fracture of left lower leg, initial encounter for closed fracture   . Abnormality of gait and mobility 05/26/2020  . Migraine 05/26/2020  . Diabetic retinopathy ([place]) 05/04/2020   . Chronic diarrhea of unknown origin 01/14/2020  . Type 2 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, with long-term current use of insulin ([place]) 12/29/2019  . Hypothyroidism 12/29/2019  . Weakness of both lower extremities 11/12/2019  . Debility 09/10/2019  . ESRD (end stage renal disease) on dialysis ([place]) 09/10/2019  . Depression 09/10/2019  . Incontinence in female 09/10/2019  . Anemia in chronic kidney disease 08/19/2019  . MGUS (monoclonal gammopathy of unknown significance) 07/23/2019  . GERD (gastroesophageal reflux disease) 02/28/2019  . Essential hypertension 10/28/2018  . Irregular menses 09/14/2009  . Hypercholesterolemia 11/19/2008  . Type 2 diabetes with nephropathy ([place]) 07/02/2007   Past Medical History:  Diagnosis Date  . Acute cystitis without hematuria   . Acute midline thoracic back pain 12/29/2019  . Acute systolic heart failure ([place]) 10/21/2019  . Anemia   . Arthritis   . Cataracts, bilateral    surgery to remove  . CATARACTS, BILATERAL 07/02/2007   Qualifier: Diagnosis of  By: Isla Pence    . Closed fracture of left femur ([place]) 10/28/2018  . Closed fracture of right ankle 11/06/2017  . Diabetes mellitus    type 2  . Emphysematous cystitis 08/26/2018  . Encounter for gynecological examination with Papanicolaou smear of cervix 01/14/2020  . Encounter for screening fecal occult blood testing 01/14/2020  . Encounter for screening for malignant neoplasm of cervix 12/29/2019  . Encounter for screening mammogram for malignant neoplasm of breast 12/29/2019  . ESRD on hemodialysis ([place])    MWF - in [place]  . GERD (gastroesophageal reflux disease)   . Hyperlipidemia   . Hypertension   . Hypokalemia 08/26/2018  . IRREGULAR MENSES 09/14/2009   Qualifier: Diagnosis of  By: Hassell Done FNP, Tori Milks    . Loose stools 11/12/2019  . Normocytic anemia 08/26/2018  . PARONYCHIA, RIGHT GREAT TOE 07/30/2008   Qualifier: Diagnosis of  By: Hassell Done FNP,  Tori Milks    . Pressure ulcer 09/10/2019  . Right arm weakness 08/08/2019  . Sprain of left ankle   . STEMI (ST elevation myocardial infarction) ([place]) 10/18/2019  . STEMI involving right coronary artery ([place]) 10/18/2019  . Stroke ([place]) 10/2019  . SVD (spontaneous vaginal delivery)    x 4  . Vaginosis 08/26/2018  . Weakness 09/10/2019  . Weakness of both lower extremities     Family History  Problem Relation Age of Onset  . Diabetes Mother   . Hypertension Mother   . Bipolar disorder Mother   . Brain cancer Maternal Aunt   . Heart disease Maternal Grandmother   . Diabetes  Maternal Grandmother   . Breast cancer Maternal Aunt   . Depression Father        Committed suicide  . Diabetes Sister     Past Surgical History:  Procedure Laterality Date  . A/V FISTULAGRAM Left 01/12/2020   Procedure: A/V FISTULAGRAM;  Surgeon: Serafina [place], MD;  Location: [facility];  Service: Cardiovascular;  Laterality: Left;  . A/V FISTULAGRAM N/A 03/17/2020   Procedure: A/V FISTULAGRAM - Left Arm;  Surgeon: Marty Heck, MD;  Location: [facility];  Service: Cardiovascular;  Laterality: N/A;  . A/V FISTULAGRAM Left 06/23/2020   Procedure: A/V FISTULAGRAM;  Surgeon: Marty Heck, MD;  Location: [facility];  Service: Cardiovascular;  Laterality: Left;  . ANKLE FUSION Left 06/01/2020   Procedure: LEFT ANKLE FUSION;  Surgeon: Newt Minion, MD;  Location: [facility];  Service: Orthopedics;  Laterality: Left;  . AV FISTULA PLACEMENT Left 08/17/2019   Procedure: LEFT BRACHIAL CEPHALIC ARTERIOVENOUS (AV) FISTULA;  Surgeon: Angelia Mould, MD;  Location: [facility];  Service: Vascular;  Laterality: Left;  . CORONARY STENT INTERVENTION N/A 10/18/2019   Procedure: CORONARY STENT INTERVENTION;  Surgeon: Sherren Mocha, MD;  Location: [facility];  Service: Cardiovascular;  Laterality: N/A;  . CORONARY/GRAFT ACUTE MI REVASCULARIZATION N/A 10/18/2019   Procedure: Coronary/Graft  Acute MI Revascularization;  Surgeon: Sherren Mocha, MD;  Location: [facility];  Service: Cardiovascular;  Laterality: N/A;  . EYE SURGERY Bilateral    cataracts removed  . FEMUR IM NAIL Left 10/28/2018   Procedure: RETROGRADE FEMORAL NAILING;  Surgeon: Meredith Pel, MD;  Location: [facility];  Service: Orthopedics;  Laterality: Left;  . IM NAILING FEMORAL SHAFT RETROGRADE Left 10/28/2018  . INTRAVASCULAR ULTRASOUND/IVUS N/A 10/18/2019   Procedure: Intravascular Ultrasound/IVUS;  Surgeon: Sherren Mocha, MD;  Location: [facility];  Service: Cardiovascular;  Laterality: N/A;  . IR FLUORO GUIDE CV LINE RIGHT  08/11/2019  . IR THORACENTESIS ASP PLEURAL SPACE W/IMG GUIDE  11/12/2019  . IR US GUIDE VASC ACCESS RIGHT  08/11/2019  . KNEE SURGERY Left   . LEFT HEART CATH AND CORONARY ANGIOGRAPHY N/A 10/18/2019   Procedure: LEFT HEART CATH AND CORONARY ANGIOGRAPHY;  Surgeon: Sherren Mocha, MD;  Location: [facility];  Service: Cardiovascular;  Laterality: N/A;  . PERIPHERAL VASCULAR BALLOON ANGIOPLASTY Left 01/12/2020   Procedure: PERIPHERAL VASCULAR BALLOON ANGIOPLASTY;  Surgeon: Serafina [place], MD;  Location: [facility];  Service: Cardiovascular;  Laterality: Left;  AVF  . PERIPHERAL VASCULAR BALLOON ANGIOPLASTY Left 03/17/2020   Procedure: PERIPHERAL VASCULAR BALLOON ANGIOPLASTY;  Surgeon: Marty Heck, MD;  Location: [facility];  Service: Cardiovascular;  Laterality: Left;  AVF  . PERIPHERAL VASCULAR BALLOON ANGIOPLASTY Left 05/05/2020   Procedure: PERIPHERAL VASCULAR BALLOON ANGIOPLASTY;  Surgeon: Marty Heck, MD;  Location: [facility];  Service: Cardiovascular;  Laterality: Left;  arm fistula  . RADIOLOGY WITH ANESTHESIA N/A 09/15/2019   Procedure: [facility] AND LUMBER LOWER BACK PAIN;  Surgeon: Radiologist, Medication, MD;  Location: [facility];  Service: Radiology;  Laterality: N/A;  . TUBAL LIGATION     Social History   Occupational History   . Not on file  Tobacco Use  . Smoking status: Former Smoker    Packs/day: 0.25    Years: 2.00    Pack years: 0.50    Types: Cigarettes    Quit date: 1997    Years since quitting: 25.2  .  Smokeless tobacco: Never Used  Vaping Use  . Vaping Use: Never used  Substance and Sexual Activity  . Alcohol use: No  . Drug use: No  . Sexual activity: Yes    Birth control/protection: Surgical    Comment: tubal

## 2020-09-27 ENCOUNTER — Ambulatory Visit (INDEPENDENT_AMBULATORY_CARE_PROVIDER_SITE_OTHER): Payer: Medicaid Other

## 2020-09-27 ENCOUNTER — Encounter: Payer: Self-pay | Admitting: Orthopedic Surgery

## 2020-09-27 ENCOUNTER — Ambulatory Visit (INDEPENDENT_AMBULATORY_CARE_PROVIDER_SITE_OTHER): Payer: Medicaid Other | Admitting: Orthopedic Surgery

## 2020-09-27 DIAGNOSIS — M25572 Pain in left ankle and joints of left foot: Secondary | ICD-10-CM | POA: Diagnosis not present

## 2020-09-27 MED ORDER — LIDOCAINE HCL (PF) 1 % IJ SOLN
1.0000 mL | INTRAMUSCULAR | Status: AC | PRN
Start: 2020-09-27 — End: 2020-09-27
  Administered 2020-09-27: 1 mL

## 2020-09-27 NOTE — Progress Notes (Signed)
Office Visit Note   Patient: Melanie Hall           Date of Birth: [birth year]           MRN: [id number] Visit Date: 09/27/2020              Requested by: Perlie Mayo, NP [address] [place],  [place] [address] PCP: Perlie Mayo, NP  Chief Complaint  Patient presents with  . Left Ankle - Follow-up    06/01/20 left ankle fusion       HPI: Patient is a 50 year old woman who presents 4 months status post left ankle fusion with subsequent Charcot collapse she is diabetic end-stage renal disease on dialysis she is currently on doxycycline.  Patient is currently ambulating in bedroom slippers she states she does have a PRAFO at home as well as a fracture boot.  Patient denies any drainage.  Assessment & Plan: Visit Diagnoses:  1. Pain in left ankle and joints of left foot     Plan: Patient will resume using the fracture boot sleep in the PRAFO at night continue with the doxycycline we will call with the cultures if they are available prior to her follow-up.  Recommend minimize weightbearing and elevating her foot to keep the pressure off the posterior heel.  Follow-Up Instructions: Return in about 1 week (around 10/04/2020).   Ortho Exam  Patient is alert, oriented, no adenopathy, well-dressed, normal affect, normal respiratory effort. Examination patient has swellin and deformity around the left ankle.  There is an ulcer anteriorly with granulation tissue there is flattened 2 cm in diameter this is probed it does not probe down to any bone or tendon or hardware.  She also has a decubitus heel ulcer which is 10 mm in diameter 3 mm deep that probes down to fascia.  After informed consent the ankle was aspirated after sterile prepping with Betadine approximately 2 cc of hematoma was aspirated there was no purulence this was sent for cultures.  Clinically patient has fibrous union which is causing motion at the ankle fusion site and causing the swelling hematoma in the ulcer  anteriorly.  The decubitus posterior ulcer appears to be directly from pressure.  Imaging: XR Ankle Complete Left  Result Date: 09/27/2020 Patient has Charcot arthropathy of the ankle fusion with hypertrophic callus bone.  No images are attached to the encounter.  Labs: Lab Results  Component Value Date   HGBA1C 8.7 (H) 06/02/2020   HGBA1C 7.6 (H) 10/18/2019   HGBA1C 7.2 (H) 09/10/2019   ESRSEDRATE 45 (H) 09/10/2019   CRP 5.0 (H) 09/10/2019   LABURIC 5.7 05/27/2019   REPTSTATUS 11/13/2019 FINAL 11/12/2019   REPTSTATUS 11/17/2019 FINAL 11/12/2019   GRAMSTAIN  11/12/2019    WBC PRESENT,BOTH PMN AND MONONUCLEAR NO ORGANISMS SEEN CYTOSPIN SMEAR Performed at [facility], [place] [address], [place], [place] [address]    CULT  11/12/2019    NO GROWTH 5 DAYS Performed at [facility] [address], [place], [place] [address]    LABORGA ESCHERICHIA COLI (A) 08/26/2018     Lab Results  Component Value Date   ALBUMIN 2.4 (L) 06/03/2020   ALBUMIN 3.9 12/15/2019   ALBUMIN 2.5 (L) 10/19/2019    Lab Results  Component Value Date   MG 1.8 08/24/2019   MG 1.7 08/23/2019   MG 1.7 08/09/2019   Lab Results  Component Value Date   VD25OH 8.82 (L) 05/27/2019    No  results found for: PREALBUMIN CBC EXTENDED Latest Ref Rng & Units 06/23/2020 06/03/2020 06/01/2020  WBC 4.0 - 10.5 K/uL - 7.0 -  RBC 3.87 - 5.11 MIL/uL - 2.05(L) -  HGB 12.0 - 15.0 g/dL 9.9(L) 6.4(LL) 9.9(L)  HCT 36.0 - 46.0 % 29.0(L) 20.4(L) 29.0(L)  PLT 150 - 400 K/uL - 184 -  NEUTROABS 1.7 - 7.7 K/uL - - -  LYMPHSABS 0.7 - 4.0 K/uL - - -     There is no height or weight on file to calculate BMI.  Orders:  Orders Placed This Encounter  Procedures  . XR Ankle Complete Left   No orders of the defined types were placed in this encounter.    Procedures: Medium Joint Inj: L ankle on 09/27/2020 2:01 PM Indications: pain and diagnostic evaluation Details: 22 G 1.5 in needle Medications: 1 mL  lidocaine (PF) 1 % Aspirate: 2 mL bloody Outcome: tolerated well, no immediate complications Procedure, treatment alternatives, risks and benefits explained, specific risks discussed. Consent was given by the patient. Immediately prior to procedure a time out was called to verify the correct patient, procedure, equipment, support staff and site/side marked as required. Patient was prepped and draped in the usual sterile fashion.      Clinical Data: No additional findings.  ROS:  All other systems negative, except as noted in the HPI. Review of Systems  Objective: Vital Signs: LMP 12/17/2016   Specialty Comments:  No specialty comments available.  PMFS History: Patient Active Problem List   Diagnosis Date Noted  . Non-intractable vomiting 06/16/2020  . Encounter for support and coordination of transition of care 06/16/2020  . S/P ankle fusion 06/01/2020  . Pilon fracture of left tibia, closed, initial encounter   . Displaced trimalleolar fracture of left lower leg, initial encounter for closed fracture   . Abnormality of gait and mobility 05/26/2020  . Migraine 05/26/2020  . Diabetic retinopathy ([place]) 05/04/2020  . Chronic diarrhea of unknown origin 01/14/2020  . Type 2 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, with long-term current use of insulin ([place]) 12/29/2019  . Hypothyroidism 12/29/2019  . Weakness of both lower extremities 11/12/2019  . Debility 09/10/2019  . ESRD (end stage renal disease) on dialysis ([place]) 09/10/2019  . Depression 09/10/2019  . Incontinence in female 09/10/2019  . Anemia in chronic kidney disease 08/19/2019  . MGUS (monoclonal gammopathy of unknown significance) 07/23/2019  . GERD (gastroesophageal reflux disease) 02/28/2019  . Essential hypertension 10/28/2018  . Irregular menses 09/14/2009  . Hypercholesterolemia 11/19/2008  . Type 2 diabetes with nephropathy ([place]) 07/02/2007   Past Medical History:  Diagnosis Date   . Acute cystitis without hematuria   . Acute midline thoracic back pain 12/29/2019  . Acute systolic heart failure ([place]) 10/21/2019  . Anemia   . Arthritis   . Cataracts, bilateral    surgery to remove  . CATARACTS, BILATERAL 07/02/2007   Qualifier: Diagnosis of  By: Isla Pence    . Closed fracture of left femur ([place]) 10/28/2018  . Closed fracture of right ankle 11/06/2017  . Diabetes mellitus    type 2  . Emphysematous cystitis 08/26/2018  . Encounter for gynecological examination with Papanicolaou smear of cervix 01/14/2020  . Encounter for screening fecal occult blood testing 01/14/2020  . Encounter for screening for malignant neoplasm of cervix 12/29/2019  . Encounter for screening mammogram for malignant neoplasm of breast 12/29/2019  . ESRD on hemodialysis ([place])    MWF - in [place]  .  GERD (gastroesophageal reflux disease)   . Hyperlipidemia   . Hypertension   . Hypokalemia 08/26/2018  . IRREGULAR MENSES 09/14/2009   Qualifier: Diagnosis of  By: Hassell Done FNP, Tori Milks    . Loose stools 11/12/2019  . Normocytic anemia 08/26/2018  . PARONYCHIA, RIGHT GREAT TOE 07/30/2008   Qualifier: Diagnosis of  By: Hassell Done FNP, Tori Milks    . Pressure ulcer 09/10/2019  . Right arm weakness 08/08/2019  . Sprain of left ankle   . STEMI (ST elevation myocardial infarction) ([place]) 10/18/2019  . STEMI involving right coronary artery ([place]) 10/18/2019  . Stroke ([place]) 10/2019  . SVD (spontaneous vaginal delivery)    x 4  . Vaginosis 08/26/2018  . Weakness 09/10/2019  . Weakness of both lower extremities     Family History  Problem Relation Age of Onset  . Diabetes Mother   . Hypertension Mother   . Bipolar disorder Mother   . Brain cancer Maternal Aunt   . Heart disease Maternal Grandmother   . Diabetes Maternal Grandmother   . Breast cancer Maternal Aunt   . Depression Father        Committed suicide  . Diabetes Sister     Past Surgical History:  Procedure Laterality Date  . A/V FISTULAGRAM Left  01/12/2020   Procedure: A/V FISTULAGRAM;  Surgeon: Serafina Mitchell, MD;  Location: [facility];  Service: Cardiovascular;  Laterality: Left;  . A/V FISTULAGRAM N/A 03/17/2020   Procedure: A/V FISTULAGRAM - Left Arm;  Surgeon: Marty Heck, MD;  Location: [facility];  Service: Cardiovascular;  Laterality: N/A;  . A/V FISTULAGRAM Left 06/23/2020   Procedure: A/V FISTULAGRAM;  Surgeon: Marty Heck, MD;  Location: [facility];  Service: Cardiovascular;  Laterality: Left;  . ANKLE FUSION Left 06/01/2020   Procedure: LEFT ANKLE FUSION;  Surgeon: Newt Minion, MD;  Location: [facility];  Service: Orthopedics;  Laterality: Left;  . AV FISTULA PLACEMENT Left 08/17/2019   Procedure: LEFT BRACHIAL CEPHALIC ARTERIOVENOUS (AV) FISTULA;  Surgeon: Angelia Mould, MD;  Location: [facility];  Service: Vascular;  Laterality: Left;  . CORONARY STENT INTERVENTION N/A 10/18/2019   Procedure: CORONARY STENT INTERVENTION;  Surgeon: Sherren Mocha, MD;  Location: [facility];  Service: Cardiovascular;  Laterality: N/A;  . CORONARY/GRAFT ACUTE MI REVASCULARIZATION N/A 10/18/2019   Procedure: Coronary/Graft Acute MI Revascularization;  Surgeon: Sherren Mocha, MD;  Location: [facility];  Service: Cardiovascular;  Laterality: N/A;  . EYE SURGERY Bilateral    cataracts removed  . FEMUR IM NAIL Left 10/28/2018   Procedure: RETROGRADE FEMORAL NAILING;  Surgeon: Meredith Pel, MD;  Location: [facility];  Service: Orthopedics;  Laterality: Left;  . IM NAILING FEMORAL SHAFT RETROGRADE Left 10/28/2018  . INTRAVASCULAR ULTRASOUND/IVUS N/A 10/18/2019   Procedure: Intravascular Ultrasound/IVUS;  Surgeon: Sherren Mocha, MD;  Location: [facility];  Service: Cardiovascular;  Laterality: N/A;  . IR FLUORO GUIDE CV LINE RIGHT  08/11/2019  . IR THORACENTESIS ASP PLEURAL SPACE W/IMG GUIDE  11/12/2019  . IR US GUIDE VASC ACCESS RIGHT  08/11/2019  . KNEE SURGERY Left   . LEFT HEART  CATH AND CORONARY ANGIOGRAPHY N/A 10/18/2019   Procedure: LEFT HEART CATH AND CORONARY ANGIOGRAPHY;  Surgeon: Sherren Mocha, MD;  Location: [facility];  Service: Cardiovascular;  Laterality: N/A;  . PERIPHERAL VASCULAR BALLOON ANGIOPLASTY Left 01/12/2020   Procedure: PERIPHERAL VASCULAR BALLOON ANGIOPLASTY;  Surgeon: Serafina Mitchell, MD;  Location: [facility];  Service: Cardiovascular;  Laterality: Left;  AVF  . PERIPHERAL VASCULAR BALLOON ANGIOPLASTY Left 03/17/2020   Procedure: PERIPHERAL VASCULAR BALLOON ANGIOPLASTY;  Surgeon: Marty Heck, MD;  Location: [facility];  Service: Cardiovascular;  Laterality: Left;  AVF  . PERIPHERAL VASCULAR BALLOON ANGIOPLASTY Left 05/05/2020   Procedure: PERIPHERAL VASCULAR BALLOON ANGIOPLASTY;  Surgeon: Marty Heck, MD;  Location: [facility];  Service: Cardiovascular;  Laterality: Left;  arm fistula  . RADIOLOGY WITH ANESTHESIA N/A 09/15/2019   Procedure: [facility] AND LUMBER LOWER BACK PAIN;  Surgeon: Radiologist, Medication, MD;  Location: [facility];  Service: Radiology;  Laterality: N/A;  . TUBAL LIGATION     Social History   Occupational History  . Not on file  Tobacco Use  . Smoking status: Former Smoker    Packs/day: 0.25    Years: 2.00    Pack years: 0.50    Types: Cigarettes    Quit date: 1997    Years since quitting: 25.2  . Smokeless tobacco: Never Used  Vaping Use  . Vaping Use: Never used  Substance and Sexual Activity  . Alcohol use: No  . Drug use: No  . Sexual activity: Yes    Birth control/protection: Surgical    Comment: tubal

## 2020-10-01 ENCOUNTER — Other Ambulatory Visit: Payer: Self-pay

## 2020-10-01 DIAGNOSIS — N186 End stage renal disease: Secondary | ICD-10-CM

## 2020-10-03 ENCOUNTER — Telehealth: Payer: Self-pay

## 2020-10-03 ENCOUNTER — Other Ambulatory Visit: Payer: Self-pay | Admitting: Orthopedic Surgery

## 2020-10-03 LAB — BODY FLUID CULTURE

## 2020-10-03 MED ORDER — SULFAMETHOXAZOLE-TRIMETHOPRIM 800-160 MG PO TABS: 1.0000 | ORAL_TABLET | Freq: Two times a day (BID) | ORAL | 0 refills | Status: DC

## 2020-10-03 NOTE — Telephone Encounter (Signed)
Front desk was finally able to reach the pt and she states that there is no way that she can come into the office today. She has an appt at 1:30 tomorrow and said that she will come in then.

## 2020-10-03 NOTE — Telephone Encounter (Signed)
06/01/20 left ankle fusion requesting refill on oxycodone 5/325. Last refill was on 09/20/20 # 20 please advise.

## 2020-10-03 NOTE — Telephone Encounter (Signed)
Called pt and lm on vm again. Advised trying to reach to sch appt today and to advise of abx called to pharm.

## 2020-10-03 NOTE — Telephone Encounter (Signed)
Patient called she is requesting rx refill for oxycodone call [phone]

## 2020-10-03 NOTE — Telephone Encounter (Signed)
Can discuss with her tomorrow

## 2020-10-03 NOTE — Telephone Encounter (Signed)
I called pt to advise of the change in abx coverage and to ask if she can come in today for evaluation per Dr. Sharol Given. Lm on vm will hold and try again later.

## 2020-10-04 ENCOUNTER — Ambulatory Visit (INDEPENDENT_AMBULATORY_CARE_PROVIDER_SITE_OTHER): Payer: Medicaid Other | Admitting: Physician Assistant

## 2020-10-04 ENCOUNTER — Encounter: Payer: Self-pay | Admitting: Orthopedic Surgery

## 2020-10-04 DIAGNOSIS — M25572 Pain in left ankle and joints of left foot: Secondary | ICD-10-CM

## 2020-10-04 MED ORDER — OXYCODONE-ACETAMINOPHEN 5-325 MG PO TABS: 1.0000 | ORAL_TABLET | Freq: Three times a day (TID) | ORAL | 0 refills | Status: DC | PRN

## 2020-10-04 MED ORDER — LEVOFLOXACIN 250 MG PO TABS: ORAL_TABLET | ORAL | 0 refills | Status: DC

## 2020-10-04 NOTE — Progress Notes (Signed)
Office Visit Note   Patient: Melanie Hall           Date of Birth: 11-06-1970           MRN: [id number] Visit Date: 10/04/2020              Requested by: Perlie Mayo, NP [address] [place],  [place] [address] PCP: Perlie Mayo, NP  Chief Complaint  Patient presents with  . Left Ankle - Follow-up    06/01/20 left ankle fusion       HPI:  Patient is here in follow-up status post left ankle fusion 4 months ago.  She has been wearing her boot and refraining from weightbearing.  She was switched to Bactrim but has noticed more nausea and vomiting.  She has a history of this but she is unsure whether it is related to the Bactrim.  She was told to take a probiotic but is not doing so at this time. Assessment & Plan: Visit Diagnoses: No diagnosis found.  Plan: Patient was directed to get a probiotic.  Discussed with Dr. Sharol Given who would like to switch her to Levaquin.  She will follow-up in 1 week.  Continue with daily dressing change.  Follow-Up Instructions: No follow-ups on file.   Ortho Exam  Patient is alert, oriented, no adenopathy, well-dressed, normal affect, normal respiratory effort. Examination ulcer on anterior ankle measures about 4 cm in diameter has no foul odor no drainage does not probe deeply has granulation tissue.  The posterior ulcer does probe close to bone on the calcaneus.  There is no surrounding cellulitis.  No ascending cellulitis in either area  Imaging: No results found. No images are attached to the encounter.  Labs: Lab Results  Component Value Date   HGBA1C 8.7 (H) 06/02/2020   HGBA1C 7.6 (H) 10/18/2019   HGBA1C 7.2 (H) 09/10/2019   ESRSEDRATE 45 (H) 09/10/2019   CRP 5.0 (H) 09/10/2019   LABURIC 5.7 05/27/2019   REPTSTATUS 11/13/2019 FINAL 11/12/2019   REPTSTATUS 11/17/2019 FINAL 11/12/2019   GRAMSTAIN  11/12/2019    WBC PRESENT,BOTH PMN AND MONONUCLEAR NO ORGANISMS SEEN CYTOSPIN SMEAR Performed at [facility], [place] [address] [place], [place] [address]    CULT  11/12/2019    NO GROWTH 5 DAYS Performed at [facility] [address] [place], [place] [address]    LABORGA ESCHERICHIA COLI (A) 08/26/2018     Lab Results  Component Value Date   ALBUMIN 2.4 (L) 06/03/2020   ALBUMIN 3.9 12/15/2019   ALBUMIN 2.5 (L) 10/19/2019    Lab Results  Component Value Date   MG 1.8 08/24/2019   MG 1.7 08/23/2019   MG 1.7 08/09/2019   Lab Results  Component Value Date   VD25OH 8.82 (L) 05/27/2019    No results found for: PREALBUMIN CBC EXTENDED Latest Ref Rng & Units 06/23/2020 06/03/2020 06/01/2020  WBC 4.0 - 10.5 K/uL - 7.0 -  RBC 3.87 - 5.11 MIL/uL - 2.05(L) -  HGB 12.0 - 15.0 g/dL 9.9(L) 6.4(LL) 9.9(L)  HCT 36.0 - 46.0 % 29.0(L) 20.4(L) 29.0(L)  PLT 150 - 400 K/uL - 184 -  NEUTROABS 1.7 - 7.7 K/uL - - -  LYMPHSABS 0.7 - 4.0 K/uL - - -     There is no height or weight on file to calculate BMI.  Orders:  No orders of the defined types were placed in this encounter.  No orders of the defined  types were placed in this encounter.    Procedures: No procedures performed  Clinical Data: No additional findings.  ROS:  All other systems negative, except as noted in the HPI. Review of Systems  Objective: Vital Signs: LMP 12/17/2016   Specialty Comments:  No specialty comments available.  PMFS History: Patient Active Problem List   Diagnosis Date Noted  . Non-intractable vomiting 06/16/2020  . Encounter for support and coordination of transition of care 06/16/2020  . S/P ankle fusion 06/01/2020  . Pilon fracture of left tibia, closed, initial encounter   . Displaced trimalleolar fracture of left lower leg, initial encounter for closed fracture   . Abnormality of gait and mobility 05/26/2020  . Migraine 05/26/2020  . Diabetic retinopathy ([place]) 05/04/2020  . Chronic diarrhea of unknown origin 01/14/2020  . Type 2 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, with  long-term current use of insulin ([place]) 12/29/2019  . Hypothyroidism 12/29/2019  . Weakness of both lower extremities 11/12/2019  . Debility 09/10/2019  . ESRD (end stage renal disease) on dialysis ([place]) 09/10/2019  . Depression 09/10/2019  . Incontinence in female 09/10/2019  . Anemia in chronic kidney disease 08/19/2019  . MGUS (monoclonal gammopathy of unknown significance) 07/23/2019  . GERD (gastroesophageal reflux disease) 02/28/2019  . Essential hypertension 10/28/2018  . Irregular menses 09/14/2009  . Hypercholesterolemia 11/19/2008  . Type 2 diabetes with nephropathy ([place]) 07/02/2007   Past Medical History:  Diagnosis Date  . Acute cystitis without hematuria   . Acute midline thoracic back pain 12/29/2019  . Acute systolic heart failure ([place]) 10/21/2019  . Anemia   . Arthritis   . Cataracts, bilateral    surgery to remove  . CATARACTS, BILATERAL 07/02/2007   Qualifier: Diagnosis of  By: Isla Pence    . Closed fracture of left femur ([facility]) 10/28/2018  . Closed fracture of right ankle 11/06/2017  . Diabetes mellitus    type 2  . Emphysematous cystitis 08/26/2018  . Encounter for gynecological examination with Papanicolaou smear of cervix 01/14/2020  . Encounter for screening fecal occult blood testing 01/14/2020  . Encounter for screening for malignant neoplasm of cervix 12/29/2019  . Encounter for screening mammogram for malignant neoplasm of breast 12/29/2019  . ESRD on hemodialysis ([place])    MWF - in [place]  . GERD (gastroesophageal reflux disease)   . Hyperlipidemia   . Hypertension   . Hypokalemia 08/26/2018  . IRREGULAR MENSES 09/14/2009   Qualifier: Diagnosis of  By: Hassell Done FNP, Tori Milks    . Loose stools 11/12/2019  . Normocytic anemia 08/26/2018  . PARONYCHIA, RIGHT GREAT TOE 07/30/2008   Qualifier: Diagnosis of  By: Hassell Done FNP, Tori Milks    . Pressure ulcer 09/10/2019  . Right arm weakness 08/08/2019  . Sprain of left ankle   . STEMI (ST elevation myocardial  infarction) ([place]) 10/18/2019  . STEMI involving right coronary artery ([place]) 10/18/2019  . Stroke ([place]) 10/2019  . SVD (spontaneous vaginal delivery)    x 4  . Vaginosis 08/26/2018  . Weakness 09/10/2019  . Weakness of both lower extremities     Family History  Problem Relation Age of Onset  . Diabetes Mother   . Hypertension Mother   . Bipolar disorder Mother   . Brain cancer Maternal Aunt   . Heart disease Maternal Grandmother   . Diabetes Maternal Grandmother   . Breast cancer Maternal Aunt   . Depression Father        Committed suicide  .  Diabetes Sister     Past Surgical History:  Procedure Laterality Date  . A/V FISTULAGRAM Left 01/12/2020   Procedure: A/V FISTULAGRAM;  Surgeon: Serafina Mitchell, MD;  Location: [facility];  Service: Cardiovascular;  Laterality: Left;  . A/V FISTULAGRAM N/A 03/17/2020   Procedure: A/V FISTULAGRAM - Left Arm;  Surgeon: Marty Heck, MD;  Location: [facility];  Service: Cardiovascular;  Laterality: N/A;  . A/V FISTULAGRAM Left 06/23/2020   Procedure: A/V FISTULAGRAM;  Surgeon: Marty Heck, MD;  Location: [facility];  Service: Cardiovascular;  Laterality: Left;  . ANKLE FUSION Left 06/01/2020   Procedure: LEFT ANKLE FUSION;  Surgeon: Newt Minion, MD;  Location: [facility];  Service: Orthopedics;  Laterality: Left;  . AV FISTULA PLACEMENT Left 08/17/2019   Procedure: LEFT BRACHIAL CEPHALIC ARTERIOVENOUS (AV) FISTULA;  Surgeon: Angelia Mould, MD;  Location: [facility];  Service: Vascular;  Laterality: Left;  . CORONARY STENT INTERVENTION N/A 10/18/2019   Procedure: CORONARY STENT INTERVENTION;  Surgeon: Sherren Mocha, MD;  Location: [facility];  Service: Cardiovascular;  Laterality: N/A;  . CORONARY/GRAFT ACUTE MI REVASCULARIZATION N/A 10/18/2019   Procedure: Coronary/Graft Acute MI Revascularization;  Surgeon: Sherren Mocha, MD;  Location: [facility];  Service: Cardiovascular;  Laterality: N/A;   . EYE SURGERY Bilateral    cataracts removed  . FEMUR IM NAIL Left 10/28/2018   Procedure: RETROGRADE FEMORAL NAILING;  Surgeon: Meredith Pel, MD;  Location: [facility];  Service: Orthopedics;  Laterality: Left;  . IM NAILING FEMORAL SHAFT RETROGRADE Left 10/28/2018  . INTRAVASCULAR ULTRASOUND/IVUS N/A 10/18/2019   Procedure: Intravascular Ultrasound/IVUS;  Surgeon: Sherren Mocha, MD;  Location: [facility];  Service: Cardiovascular;  Laterality: N/A;  . IR FLUORO GUIDE CV LINE RIGHT  08/11/2019  . IR THORACENTESIS ASP PLEURAL SPACE W/IMG GUIDE  11/12/2019  . IR US GUIDE VASC ACCESS RIGHT  08/11/2019  . KNEE SURGERY Left   . LEFT HEART CATH AND CORONARY ANGIOGRAPHY N/A 10/18/2019   Procedure: LEFT HEART CATH AND CORONARY ANGIOGRAPHY;  Surgeon: Sherren Mocha, MD;  Location: [facility];  Service: Cardiovascular;  Laterality: N/A;  . PERIPHERAL VASCULAR BALLOON ANGIOPLASTY Left 01/12/2020   Procedure: PERIPHERAL VASCULAR BALLOON ANGIOPLASTY;  Surgeon: Serafina Mitchell, MD;  Location: [facility];  Service: Cardiovascular;  Laterality: Left;  AVF  . PERIPHERAL VASCULAR BALLOON ANGIOPLASTY Left 03/17/2020   Procedure: PERIPHERAL VASCULAR BALLOON ANGIOPLASTY;  Surgeon: Marty Heck, MD;  Location: [facility];  Service: Cardiovascular;  Laterality: Left;  AVF  . PERIPHERAL VASCULAR BALLOON ANGIOPLASTY Left 05/05/2020   Procedure: PERIPHERAL VASCULAR BALLOON ANGIOPLASTY;  Surgeon: Marty Heck, MD;  Location: [facility];  Service: Cardiovascular;  Laterality: Left;  arm fistula  . RADIOLOGY WITH ANESTHESIA N/A 09/15/2019   Procedure: [facility] AND LUMBER LOWER BACK PAIN;  Surgeon: Radiologist, Medication, MD;  Location: [facility];  Service: Radiology;  Laterality: N/A;  . TUBAL LIGATION     Social History   Occupational History  . Not on file  Tobacco Use  . Smoking status: Former Smoker    Packs/day: 0.25    Years: 2.00    Pack years: 0.50    Types:  Cigarettes    Quit date: 1997    Years since quitting: 25.2  . Smokeless tobacco: Never Used  Vaping Use  . Vaping Use: Never used  Substance and Sexual Activity  . Alcohol use: No  .  Drug use: No  . Sexual activity: Yes    Birth control/protection: Surgical    Comment: tubal

## 2020-10-04 NOTE — Telephone Encounter (Signed)
Addressed abx while in office today.

## 2020-10-06 ENCOUNTER — Other Ambulatory Visit: Payer: Self-pay

## 2020-10-06 ENCOUNTER — Ambulatory Visit: Payer: Medicaid Other | Admitting: Physician Assistant

## 2020-10-06 ENCOUNTER — Ambulatory Visit (HOSPITAL_COMMUNITY)
Admission: RE | Admit: 2020-10-06 | Discharge: 2020-10-06 | Disposition: A | Discharge status: Home / Self Care | Payer: Medicaid Other | Source: Ambulatory Visit | Attending: Vascular Surgery | Admitting: Vascular Surgery

## 2020-10-06 VITALS — BP 151/84 | HR 86 | Temp 98.3°F | Resp 20 | Ht 63.0 in

## 2020-10-06 DIAGNOSIS — Z992 Dependence on renal dialysis: Secondary | ICD-10-CM | POA: Insufficient documentation

## 2020-10-06 DIAGNOSIS — N186 End stage renal disease: Secondary | ICD-10-CM

## 2020-10-06 NOTE — Progress Notes (Signed)
Office Note     CC:  follow up Requesting Provider:  Perlie Mayo, NP  HPI: Melanie Hall is a 50 y.o. (1970-10-07) female who presents for evaluation of left brachiocephalic fistula.  Fistula was initially created in February of last year.  Over the course of the last year she has had 2 fistulogram's involving balloon angioplasty of outflow.  She had a third fistulogram in December 2021 did not show any hemodynamically significant narrowing.  Based on imaging at that time the recommendation was to continue HD from brachiocephalic fistula and if there continued to be problems with cannulation then she would require a new dialysis access.  There continues to be occasional difficulty with cannulation however she is not sure why.  Her nephrologist said that the right [facility] can be removed however she just wanted to double check with our office prior to consenting.   Past Medical History:  Diagnosis Date  . Acute cystitis without hematuria   . Acute midline thoracic back pain 12/29/2019  . Acute systolic heart failure ([place]) 10/21/2019  . Anemia   . Arthritis   . Cataracts, bilateral    surgery to remove  . CATARACTS, BILATERAL 07/02/2007   Qualifier: Diagnosis of  By: Isla Pence    . Closed fracture of left femur ([place]) 10/28/2018  . Closed fracture of right ankle 11/06/2017  . Diabetes mellitus    type 2  . Emphysematous cystitis 08/26/2018  . Encounter for gynecological examination with Papanicolaou smear of cervix 01/14/2020  . Encounter for screening fecal occult blood testing 01/14/2020  . Encounter for screening for malignant neoplasm of cervix 12/29/2019  . Encounter for screening mammogram for malignant neoplasm of breast 12/29/2019  . ESRD on hemodialysis ([place])    MWF - in [place]  . GERD (gastroesophageal reflux disease)   . Hyperlipidemia   . Hypertension   . Hypokalemia 08/26/2018  . IRREGULAR MENSES 09/14/2009   Qualifier: Diagnosis of  By: Hassell Done FNP, Tori Milks    . Loose  stools 11/12/2019  . Normocytic anemia 08/26/2018  . PARONYCHIA, RIGHT GREAT TOE 07/30/2008   Qualifier: Diagnosis of  By: Hassell Done FNP, Tori Milks    . Pressure ulcer 09/10/2019  . Right arm weakness 08/08/2019  . Sprain of left ankle   . STEMI (ST elevation myocardial infarction) ([place]) 10/18/2019  . STEMI involving right coronary artery ([place]) 10/18/2019  . Stroke ([place]) 10/2019  . SVD (spontaneous vaginal delivery)    x 4  . Vaginosis 08/26/2018  . Weakness 09/10/2019  . Weakness of both lower extremities     Past Surgical History:  Procedure Laterality Date  . A/V FISTULAGRAM Left 01/12/2020   Procedure: A/V FISTULAGRAM;  Surgeon: Serafina Mitchell, MD;  Location: [place] [facility];  Service: Cardiovascular;  Laterality: Left;  . A/V FISTULAGRAM N/A 03/17/2020   Procedure: A/V FISTULAGRAM - Left Arm;  Surgeon: Marty Heck, MD;  Location: [place] [facility];  Service: Cardiovascular;  Laterality: N/A;  . A/V FISTULAGRAM Left 06/23/2020   Procedure: A/V FISTULAGRAM;  Surgeon: Marty Heck, MD;  Location: [place] [facility];  Service: Cardiovascular;  Laterality: Left;  . ANKLE FUSION Left 06/01/2020   Procedure: LEFT ANKLE FUSION;  Surgeon: Newt Minion, MD;  Location: [place];  Service: Orthopedics;  Laterality: Left;  . AV FISTULA PLACEMENT Left 08/17/2019   Procedure: LEFT BRACHIAL CEPHALIC ARTERIOVENOUS (AV) FISTULA;  Surgeon: Angelia Mould, MD;  Location: [place];  Service: Vascular;  Laterality: Left;  .  CORONARY STENT INTERVENTION N/A 10/18/2019   Procedure: CORONARY STENT INTERVENTION;  Surgeon: Sherren Mocha, MD;  Location: [place] [facility];  Service: Cardiovascular;  Laterality: N/A;  . CORONARY/GRAFT ACUTE MI REVASCULARIZATION N/A 10/18/2019   Procedure: Coronary/Graft Acute MI Revascularization;  Surgeon: Sherren Mocha, MD;  Location: [place] [facility];  Service: Cardiovascular;  Laterality: N/A;  . EYE SURGERY Bilateral    cataracts removed  . FEMUR IM NAIL Left  10/28/2018   Procedure: RETROGRADE FEMORAL NAILING;  Surgeon: Meredith Pel, MD;  Location: [place];  Service: Orthopedics;  Laterality: Left;  . IM NAILING FEMORAL SHAFT RETROGRADE Left 10/28/2018  . INTRAVASCULAR ULTRASOUND/IVUS N/A 10/18/2019   Procedure: Intravascular Ultrasound/IVUS;  Surgeon: Sherren Mocha, MD;  Location: [place] [facility];  Service: Cardiovascular;  Laterality: N/A;  . IR FLUORO GUIDE CV LINE RIGHT  08/11/2019  . IR THORACENTESIS ASP PLEURAL SPACE W/IMG GUIDE  11/12/2019  . IR US GUIDE VASC ACCESS RIGHT  08/11/2019  . KNEE SURGERY Left   . LEFT HEART CATH AND CORONARY ANGIOGRAPHY N/A 10/18/2019   Procedure: LEFT HEART CATH AND CORONARY ANGIOGRAPHY;  Surgeon: Sherren Mocha, MD;  Location: [place] [facility];  Service: Cardiovascular;  Laterality: N/A;  . PERIPHERAL VASCULAR BALLOON ANGIOPLASTY Left 01/12/2020   Procedure: PERIPHERAL VASCULAR BALLOON ANGIOPLASTY;  Surgeon: Serafina Mitchell, MD;  Location: [place] [facility];  Service: Cardiovascular;  Laterality: Left;  AVF  . PERIPHERAL VASCULAR BALLOON ANGIOPLASTY Left 03/17/2020   Procedure: PERIPHERAL VASCULAR BALLOON ANGIOPLASTY;  Surgeon: Marty Heck, MD;  Location: [place] [facility];  Service: Cardiovascular;  Laterality: Left;  AVF  . PERIPHERAL VASCULAR BALLOON ANGIOPLASTY Left 05/05/2020   Procedure: PERIPHERAL VASCULAR BALLOON ANGIOPLASTY;  Surgeon: Marty Heck, MD;  Location: [place] [facility];  Service: Cardiovascular;  Laterality: Left;  arm fistula  . RADIOLOGY WITH ANESTHESIA N/A 09/15/2019   Procedure: [facility] AND LUMBER LOWER BACK PAIN;  Surgeon: Radiologist, Medication, MD;  Location: [place];  Service: Radiology;  Laterality: N/A;  . TUBAL LIGATION      Social History   Socioeconomic History  . Marital status: Widowed    Spouse name: Not on file  . Number of children: 4  . Years of education: Not on file  . Highest education level: 10th grade  Occupational History  . Not on file   Tobacco Use  . Smoking status: Former Smoker    Packs/day: 0.25    Years: 2.00    Pack years: 0.50    Types: Cigarettes    Quit date: 1997    Years since quitting: 25.2  . Smokeless tobacco: Never Used  Vaping Use  . Vaping Use: Never used  Substance and Sexual Activity  . Alcohol use: No  . Drug use: No  . Sexual activity: Yes    Birth control/protection: Surgical    Comment: tubal   Other Topics Concern  . Not on file  Social History Narrative   Lives with friend with Joelene Millin   Cats: [place], [place], [place], another kitten      Enjoy: watching TV-[employer], [employer], and [employer]      Diet: eats all food groups: focuses on Dm and Kidney friendly foods    Caffeine: none   Water: Fluid Restriction 32 ounces a day          Wears seat belt: not currently driving    Oceanographer at home    No weapons    Social Determinants of Health  Financial Resource Strain: Low Risk   . Difficulty of Paying Living Expenses: Not hard at all  Food Insecurity: No Food Insecurity  . Worried About Charity fundraiser in the Last Year: Never true  . Ran Out of Food in the Last Year: Never true  Transportation Needs: No Transportation Needs  . Lack of Transportation (Medical): No  . Lack of Transportation (Non-Medical): No  Physical Activity: Inactive  . Days of Exercise per Week: 0 days  . Minutes of Exercise per Session: 0 min  Stress: No Stress Concern Present  . Feeling of Stress : Not at all  Social Connections: Socially Isolated  . Frequency of Communication with Friends and Family: Never  . Frequency of Social Gatherings with Friends and Family: Never  . Attends Religious Services: Never  . Active Member of Clubs or Organizations: No  . Attends Archivist Meetings: Never  . Marital Status: Widowed  Intimate Partner Violence: Not At Risk  . Fear of Current or Ex-Partner: No  . Emotionally Abused: No  . Physically Abused: No  . Sexually Abused: No    Family History   Problem Relation Age of Onset  . Diabetes Mother   . Hypertension Mother   . Bipolar disorder Mother   . Brain cancer Maternal Aunt   . Heart disease Maternal Grandmother   . Diabetes Maternal Grandmother   . Breast cancer Maternal Aunt   . Depression Father        Committed suicide  . Diabetes Sister     Current Outpatient Medications  Medication Sig Dispense Refill  . aspirin 81 MG EC tablet Take 1 tablet (81 mg total) by mouth daily. 90 tablet 3  . atorvastatin (LIPITOR) 80 MG tablet Take 1 tablet (80 mg total) by mouth daily at 6 PM. 90 tablet 3  . carvedilol (COREG) 6.25 MG tablet Take 1.5 tablets (9.375 mg total) by mouth 2 (two) times daily. 180 tablet 3  . diphenhydrAMINE (BENADRYL) 50 MG capsule One tablet (50 mg) by mouth 1 hour prior to procedure 1 capsule 0  . doxycycline (VIBRA-TABS) 100 MG tablet Take 1 tablet (100 mg total) by mouth 2 (two) times daily. 60 tablet 0  . doxycycline (VIBRA-TABS) 100 MG tablet Take 1 tablet (100 mg total) by mouth 2 (two) times daily. 20 tablet 0  . epoetin alfa (EPOGEN) 3000 UNIT/ML injection Inject 3,000 Units into the skin every 14 (fourteen) days.     Marland Kitchen gabapentin (NEURONTIN) 300 MG capsule Take 1 capsule (300 mg total) by mouth at bedtime. 90 capsule 0  . hydrALAZINE (APRESOLINE) 10 MG tablet Take 1 tablet (10 mg total) by mouth in the morning and at bedtime. 180 tablet 3  . insulin aspart protamine- aspart (NOVOLOG MIX 70/30) (70-30) 100 UNIT/ML injection Inject 0.1 mLs (10 Units total) into the skin 2 (two) times daily with a meal. 10 mL 3  . Insulin Syringes, Disposable, U-100 1 ML MISC Use to inject insulin twice daily dx e11.65 100 each 5  . isosorbide mononitrate (IMDUR) 30 MG 24 hr tablet Take 0.5 tablets (15 mg total) by mouth daily. 15 tablet 11  . levofloxacin (LEVAQUIN) 250 MG tablet 500 mg po x1 then 250 mg  q 48hours  After dialysis no supplement 20 tablet 0  . levothyroxine (SYNTHROID) 25 MCG tablet Take 1 tablet (25 mcg  total) by mouth daily before breakfast. 30 tablet 1  . multivitamin (RENA-VIT) TABS tablet Take 1 tablet by mouth at  bedtime. 30 tablet 0  . nitroGLYCERIN (NITROSTAT) 0.4 MG SL tablet Place 1 tablet (0.4 mg total) under the tongue every 5 (five) minutes x 3 doses as needed for chest pain. (Patient taking differently: Place 0.4 mg under the tongue every 5 (five) minutes as needed for chest pain (max 3 doses).) 25 tablet 3  . ondansetron (ZOFRAN ODT) 4 MG disintegrating tablet Take 1 tablet (4 mg total) by mouth every 8 (eight) hours as needed for nausea or vomiting. 15 tablet 0  . oxyCODONE-acetaminophen (PERCOCET/ROXICET) 5-325 MG tablet Take 1 tablet by mouth every 8 (eight) hours as needed for severe pain. 20 tablet 0  . predniSONE (DELTASONE) 50 MG tablet One tablet (50mg ) 13 hours prior to procedure; one tablet (50mg ) 7 hours prior to procedure and then one tablet (50 mg) one hour prior to procedure. 3 tablet 0  . sertraline (ZOLOFT) 25 MG tablet Take 1 tablet (25 mg total) by mouth daily. 30 tablet 0  . silver sulfADIAZINE (SILVADENE) 1 % cream Apply 1 application topically daily. 50 g 0  . sulfamethoxazole-trimethoprim (BACTRIM DS) 800-160 MG tablet Take 1 tablet by mouth 2 (two) times daily. 30 tablet 0  . tamsulosin (FLOMAX) 0.4 MG CAPS capsule Take 1 capsule (0.4 mg total) by mouth daily after supper. 90 capsule 0  . ticagrelor (BRILINTA) 90 MG TABS tablet Take 1 tablet (90 mg total) by mouth 2 (two) times daily. 180 tablet 3  . lidocaine-prilocaine (EMLA) cream Apply topically.     Current Facility-Administered Medications  Medication Dose Route Frequency Provider Last Rate Last Admin  . 0.9 %  sodium chloride infusion  250 mL Intravenous PRN Marty Heck, MD      . 0.9 %  sodium chloride infusion  250 mL Intravenous PRN Marty Heck, MD      . sodium chloride flush (NS) 0.9 % injection 3 mL  3 mL Intravenous Q12H Marty Heck, MD      . sodium chloride flush (NS)  0.9 % injection 3 mL  3 mL Intravenous PRN Marty Heck, MD        Allergies  Allergen Reactions  . Contrast Media [Iodinated Diagnostic Agents] Nausea And Vomiting    Treated with Benadryl & Solumedrol  . Penicillins Other (See Comments)    Don't want to take PCN due to family history      REVIEW OF SYSTEMS:   [X]  denotes positive finding, [ ]  denotes negative finding Cardiac  Comments:  Chest pain or chest pressure:    Shortness of breath upon exertion:    Short of breath when lying flat:    Irregular heart rhythm:        Vascular    Pain in calf, thigh, or hip brought on by ambulation:    Pain in feet at night that wakes you up from your sleep:     Blood clot in your veins:    Leg swelling:         Pulmonary    Oxygen at home:    Productive cough:     Wheezing:         Neurologic    Sudden weakness in arms or legs:     Sudden numbness in arms or legs:     Sudden onset of difficulty speaking or slurred speech:    Temporary loss of vision in one eye:     Problems with dizziness:         Gastrointestinal  Blood in stool:     Vomited blood:         Genitourinary    Burning when urinating:     Blood in urine:        Psychiatric    Major depression:         Hematologic    Bleeding problems:    Problems with blood clotting too easily:        Skin    Rashes or ulcers:        Constitutional    Fever or chills:      PHYSICAL EXAMINATION:  Vitals:   10/06/20 1102  BP: (!) 151/84  Pulse: 86  Resp: 20  Temp: 98.3 F (36.8 C)  TempSrc: Temporal  SpO2: 100%  Height: 5\' 3"  (1.6 m)    General:  WDWN in NAD; vital signs documented above Gait: Not observed HENT: WNL, normocephalic Pulmonary: normal non-labored breathing Cardiac: regular HR Abdomen: soft, NT, no masses Skin: without rashes Vascular Exam/Pulses:  Right Left  Radial 2+ (normal) 1+ (weak)   Extremities: without ischemic changes, without Gangrene , without cellulitis; without  open wounds; no areas of skin breakdown or drastic aneurysmal degeneration Musculoskeletal: no muscle wasting or atrophy  Neurologic: A&O X 3;  No focal weakness or paresthesias are detected Psychiatric:  The pt has Normal affect.   Non-Invasive Vascular Imaging:   No hemodynamically significant stenosis seen on fistula duplex    ASSESSMENT/PLAN:: 50 y.o. female here for evaluation of left brachiocephalic fistula prior to having right IJ TDC removed   -On physical exam, left brachiocephalic fistula has a palpable thrill throughout its course in the upper arm -On fistula duplex there are no areas of hemodynamically significant stenosis; fistula is adequate size and depth -Vascular surgery has nothing further to offer based on physical exam and fistula duplex; we will defer to nephrology as a pertains to the timing of right IJ TDC removal -Patient will follow up on an as-needed basis   Dagoberto Ligas, PA-C [facility] [phone]  Clinic MD:   Oneida Alar

## 2020-10-11 ENCOUNTER — Telehealth: Payer: Self-pay | Admitting: Orthopedic Surgery

## 2020-10-11 ENCOUNTER — Ambulatory Visit: Payer: Medicaid Other | Admitting: Orthopedic Surgery

## 2020-10-11 NOTE — Telephone Encounter (Signed)
error 

## 2020-10-13 ENCOUNTER — Ambulatory Visit: Payer: Medicaid Other | Admitting: Orthopedic Surgery

## 2020-10-17 ENCOUNTER — Emergency Department (HOSPITAL_COMMUNITY): Payer: Medicaid Other

## 2020-10-17 ENCOUNTER — Other Ambulatory Visit: Payer: Self-pay

## 2020-10-17 ENCOUNTER — Emergency Department (HOSPITAL_COMMUNITY)
Admission: EM | Admit: 2020-10-17 | Discharge: 2020-10-17 | Disposition: A | Discharge status: Home / Self Care | Payer: Medicaid Other | Attending: Emergency Medicine | Admitting: Emergency Medicine

## 2020-10-17 ENCOUNTER — Encounter (HOSPITAL_COMMUNITY): Payer: Self-pay | Admitting: *Deleted

## 2020-10-17 DIAGNOSIS — Z8673 Personal history of transient ischemic attack (TIA), and cerebral infarction without residual deficits: Secondary | ICD-10-CM | POA: Insufficient documentation

## 2020-10-17 DIAGNOSIS — E11319 Type 2 diabetes mellitus with unspecified diabetic retinopathy without macular edema: Secondary | ICD-10-CM | POA: Diagnosis not present

## 2020-10-17 DIAGNOSIS — E639 Nutritional deficiency, unspecified: Secondary | ICD-10-CM | POA: Insufficient documentation

## 2020-10-17 DIAGNOSIS — I132 Hypertensive heart and chronic kidney disease with heart failure and with stage 5 chronic kidney disease, or end stage renal disease: Secondary | ICD-10-CM | POA: Diagnosis not present

## 2020-10-17 DIAGNOSIS — S1093XA Contusion of unspecified part of neck, initial encounter: Secondary | ICD-10-CM | POA: Insufficient documentation

## 2020-10-17 DIAGNOSIS — I5021 Acute systolic (congestive) heart failure: Secondary | ICD-10-CM | POA: Insufficient documentation

## 2020-10-17 DIAGNOSIS — S169XXA Unspecified injury of muscle, fascia and tendon at neck level, initial encounter: Secondary | ICD-10-CM | POA: Diagnosis present

## 2020-10-17 DIAGNOSIS — S2001XA Contusion of right breast, initial encounter: Secondary | ICD-10-CM | POA: Insufficient documentation

## 2020-10-17 DIAGNOSIS — R1030 Lower abdominal pain, unspecified: Secondary | ICD-10-CM | POA: Diagnosis not present

## 2020-10-17 DIAGNOSIS — Z87891 Personal history of nicotine dependence: Secondary | ICD-10-CM | POA: Insufficient documentation

## 2020-10-17 DIAGNOSIS — Z79899 Other long term (current) drug therapy: Secondary | ICD-10-CM | POA: Diagnosis not present

## 2020-10-17 DIAGNOSIS — E1122 Type 2 diabetes mellitus with diabetic chronic kidney disease: Secondary | ICD-10-CM | POA: Insufficient documentation

## 2020-10-17 DIAGNOSIS — M79642 Pain in left hand: Secondary | ICD-10-CM | POA: Diagnosis not present

## 2020-10-17 DIAGNOSIS — N186 End stage renal disease: Secondary | ICD-10-CM | POA: Insufficient documentation

## 2020-10-17 DIAGNOSIS — Z7982 Long term (current) use of aspirin: Secondary | ICD-10-CM | POA: Diagnosis not present

## 2020-10-17 DIAGNOSIS — Z794 Long term (current) use of insulin: Secondary | ICD-10-CM | POA: Diagnosis not present

## 2020-10-17 DIAGNOSIS — Z992 Dependence on renal dialysis: Secondary | ICD-10-CM | POA: Insufficient documentation

## 2020-10-17 DIAGNOSIS — Y92410 Unspecified street and highway as the place of occurrence of the external cause: Secondary | ICD-10-CM | POA: Diagnosis not present

## 2020-10-17 DIAGNOSIS — T1490XA Injury, unspecified, initial encounter: Secondary | ICD-10-CM

## 2020-10-17 LAB — BASIC METABOLIC PANEL
Anion gap: 17 — ABNORMAL HIGH (ref 5–15)
BUN: 60 mg/dL — ABNORMAL HIGH (ref 6–20)
CO2: 18 mmol/L — ABNORMAL LOW (ref 22–32)
Calcium: 8.5 mg/dL — ABNORMAL LOW (ref 8.9–10.3)
Chloride: 98 mmol/L (ref 98–111)
Creatinine, Ser: 7.19 mg/dL — ABNORMAL HIGH (ref 0.44–1.00)
GFR, Estimated: 6 mL/min — ABNORMAL LOW (ref >60)
Glucose, Bld: 150 mg/dL — ABNORMAL HIGH (ref 70–99)
Potassium: 4.3 mmol/L (ref 3.5–5.1)
Sodium: 133 mmol/L — ABNORMAL LOW (ref 135–145)

## 2020-10-17 LAB — CBC WITH DIFFERENTIAL/PLATELET
Abs Immature Granulocytes: 0.08 10*3/uL — ABNORMAL HIGH (ref 0.00–0.07)
Basophils Absolute: 0 10*3/uL (ref 0.0–0.1)
Basophils Relative: 0 %
Eosinophils Absolute: 0.2 10*3/uL (ref 0.0–0.5)
Eosinophils Relative: 2 %
HCT: 36.3 % (ref 36.0–46.0)
Hemoglobin: 11.4 g/dL — ABNORMAL LOW (ref 12.0–15.0)
Immature Granulocytes: 1 %
Lymphocytes Relative: 7 %
Lymphs Abs: 0.6 10*3/uL — ABNORMAL LOW (ref 0.7–4.0)
MCH: 31.6 pg (ref 26.0–34.0)
MCHC: 31.4 g/dL (ref 30.0–36.0)
MCV: 100.6 fL — ABNORMAL HIGH (ref 80.0–100.0)
Monocytes Absolute: 0.5 10*3/uL (ref 0.1–1.0)
Monocytes Relative: 6 %
Neutro Abs: 8.2 10*3/uL — ABNORMAL HIGH (ref 1.7–7.7)
Neutrophils Relative %: 84 %
Platelets: 192 10*3/uL (ref 150–400)
RBC: 3.61 MIL/uL — ABNORMAL LOW (ref 3.87–5.11)
RDW: 15.7 % — ABNORMAL HIGH (ref 11.5–15.5)
WBC: 9.7 10*3/uL (ref 4.0–10.5)
nRBC: 0 % (ref 0.0–0.2)

## 2020-10-17 LAB — RENAL FUNCTION PANEL
Albumin: 3.3 g/dL — ABNORMAL LOW (ref 3.5–5.0)
Anion gap: 18 — ABNORMAL HIGH (ref 5–15)
BUN: 63 mg/dL — ABNORMAL HIGH (ref 6–20)
CO2: 17 mmol/L — ABNORMAL LOW (ref 22–32)
Calcium: 8.5 mg/dL — ABNORMAL LOW (ref 8.9–10.3)
Chloride: 99 mmol/L (ref 98–111)
Creatinine, Ser: 6.95 mg/dL — ABNORMAL HIGH (ref 0.44–1.00)
GFR, Estimated: 7 mL/min — ABNORMAL LOW (ref >60)
Glucose, Bld: 150 mg/dL — ABNORMAL HIGH (ref 70–99)
Phosphorus: 7.6 mg/dL — ABNORMAL HIGH (ref 2.5–4.6)
Potassium: 4.3 mmol/L (ref 3.5–5.1)
Sodium: 134 mmol/L — ABNORMAL LOW (ref 135–145)
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
## 2020-10-17 MED ORDER — IOHEXOL 300 MG/ML  SOLN
100.0000 mL | Freq: Once | INTRAMUSCULAR | Status: AC | PRN
  Administered 2020-10-17: 100 mL via INTRAVENOUS

## 2020-10-17 MED ORDER — DIPHENHYDRAMINE HCL 25 MG PO CAPS: 50.0000 mg | ORAL_CAPSULE | Freq: Once | ORAL | Status: AC

## 2020-10-17 MED ORDER — ALTEPLASE 2 MG IJ SOLR
2.0000 mg | Freq: Once | INTRAMUSCULAR | Status: DC | PRN
  Filled 2020-10-17: qty 2

## 2020-10-17 MED ORDER — DIPHENHYDRAMINE HCL 25 MG PO CAPS
50.0000 mg | ORAL_CAPSULE | Freq: Once | ORAL | Status: DC
  Filled 2020-10-17: qty 2

## 2020-10-17 MED ORDER — SODIUM CHLORIDE 0.9 % IV SOLN: 100.0000 mL | INTRAVENOUS | Status: DC | PRN

## 2020-10-17 MED ORDER — OXYCODONE-ACETAMINOPHEN 5-325 MG PO TABS: 1.0000 | ORAL_TABLET | Freq: Three times a day (TID) | ORAL | 0 refills | Status: AC | PRN

## 2020-10-17 MED ORDER — DIPHENHYDRAMINE HCL 50 MG/ML IJ SOLN
50.0000 mg | Freq: Once | INTRAMUSCULAR | Status: DC
  Filled 2020-10-17: qty 1

## 2020-10-17 MED ORDER — DIPHENHYDRAMINE HCL 50 MG/ML IJ SOLN
50.0000 mg | Freq: Once | INTRAMUSCULAR | Status: AC
  Administered 2020-10-17: 50 mg via INTRAVENOUS

## 2020-10-17 MED ORDER — HEPARIN SODIUM (PORCINE) 1000 UNIT/ML DIALYSIS: 3200.0000 [IU] | INTRAMUSCULAR | Status: DC | PRN

## 2020-10-17 MED ORDER — OXYCODONE-ACETAMINOPHEN 5-325 MG PO TABS
1.0000 | ORAL_TABLET | Freq: Once | ORAL | Status: AC
  Administered 2020-10-17: 1 via ORAL
  Filled 2020-10-17: qty 1

## 2020-10-17 MED ORDER — CHLORHEXIDINE GLUCONATE CLOTH 2 % EX PADS: 6.0000 | MEDICATED_PAD | Freq: Every day | CUTANEOUS | Status: DC

## 2020-10-17 MED ORDER — HEPARIN SODIUM (PORCINE) 1000 UNIT/ML DIALYSIS: 1000.0000 [IU] | INTRAMUSCULAR | Status: DC | PRN

## 2020-10-17 MED ORDER — HYDROCORTISONE NA SUCCINATE PF 250 MG IJ SOLR
200.0000 mg | Freq: Once | INTRAMUSCULAR | Status: AC
  Administered 2020-10-17: 200 mg via INTRAVENOUS
  Filled 2020-10-17: qty 200

## 2020-10-17 MED ORDER — ONDANSETRON 8 MG PO TBDP
8.0000 mg | ORAL_TABLET | Freq: Once | ORAL | Status: AC
  Administered 2020-10-17: 8 mg via ORAL
  Filled 2020-10-17: qty 1

## 2020-10-17 NOTE — Procedures (Signed)
   HEMODIALYSIS TREATMENT NOTE:  HD was performed in ED with plans to discharge home thereafter.  3.5 hour heparin-free session completed via RIJ TDC.  AVF was difficult to access given pt's hoodie and C-collar.    Soft pressures during last hour of treatment; UF remained suspended for SBP 92-104.  Net UF 1.5 liters.  All blood was returned.  Rockwell Alexandria, RN
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
## 2020-10-17 NOTE — ED Notes (Addendum)
Pt states she is ready to go home.  Pt no more emesis since zofran given.
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
## 2020-10-18 ENCOUNTER — Telehealth: Payer: Self-pay

## 2020-10-18 ENCOUNTER — Encounter: Payer: Self-pay | Admitting: Orthopedic Surgery

## 2020-10-18 ENCOUNTER — Ambulatory Visit (INDEPENDENT_AMBULATORY_CARE_PROVIDER_SITE_OTHER): Payer: Medicaid Other

## 2020-10-18 ENCOUNTER — Telehealth: Payer: Self-pay | Admitting: Dietician

## 2020-10-18 ENCOUNTER — Ambulatory Visit (INDEPENDENT_AMBULATORY_CARE_PROVIDER_SITE_OTHER): Payer: Medicaid Other | Admitting: Physician Assistant

## 2020-10-18 DIAGNOSIS — M25572 Pain in left ankle and joints of left foot: Secondary | ICD-10-CM

## 2020-10-18 DIAGNOSIS — M25579 Pain in unspecified ankle and joints of unspecified foot: Secondary | ICD-10-CM
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
## 2020-10-18 NOTE — Addendum Note (Signed)
Addended by: Lynda Rainwater on: 10/18/2020 11:52 AM   Modules accepted: Orders

## 2020-10-18 NOTE — Telephone Encounter (Signed)
Transition Care Management Follow-up Telephone Call  Date of discharge and from where: 10/17/20 from [facility]    How have you been since you were released from the hospital? "ok have some Foot pain"    Any questions or concerns? No  Items Reviewed:  Did the pt receive and understand the discharge instructions provided? Yes   Medications obtained and verified? No pt says she doesn't have a way to get to pharmacy to get meds    Other? No   Any new allergies since your discharge? No   Dietary orders reviewed? No  Do you have support at home? No   Home Care and Equipment/Supplies: Were home health services ordered? No not applicable If so, what is the name of the agency? N/A  Has the agency set up a time to come to the patient's home? not applicable Were any new equipment or medical supplies ordered?  No What is the name of the medical supply agency? N/A Were you able to get the supplies/equipment? not applicable Do you have any questions related to the use of the equipment or supplies? No  Functional Questionnaire: (I = Independent and D = Dependent) ADLs: I  Bathing/Dressing- I  Meal Prep- I  Eating- I  Maintaining continence- I  Transferring/Ambulation- I  Managing Meds- D needs transportation to the pharmacy  Follow up appointments reviewed:   PCP Hospital f/u appt confirmed? Yes  Scheduled to see Dr. Antoine Primas On April 12th at 1 pm.  [facility] f/u appt confirmed? No    Are transportation arrangements needed? Yes   If their condition worsens, is the pt aware to call PCP or go to the Emergency Dept.? Yes  Was the patient provided with contact information for the PCP's office or ED? Yes   Was to pt encouraged to call back with questions or concerns? Yes

## 2020-10-18 NOTE — Telephone Encounter (Signed)
   Telephone encounter was:  Unsuccessful.  10/18/2020 Name: Melanie Hall MRN: [id number] DOB: 25-Aug-1970  Unsuccessful outbound call made today to assist with:  Patient disconnected call, called again left message for patient to return my call regarding transportation.  Outreach Attempt:  1st Attempt  A HIPAA compliant voice message was left requesting a return call.  Instructed patient to call back at [phone].  Aaylah Pokorny, AAS Paralegal, [place] . Embedded Care Coordination [facility]  Care Management  [address], [place] [address] ??[email or web address]  ?? [phone]   [email or web address]

## 2020-10-19 ENCOUNTER — Telehealth: Payer: Self-pay

## 2020-10-19 NOTE — Telephone Encounter (Signed)
   Telephone encounter was:  Unsuccessful.  10/19/2020 Name: Melanie Hall MRN: [id number] DOB: 10-02-1970  Unsuccessful outbound call made today to assist with:  Transportation Needs   Outreach Attempt:  2nd Attempt  A HIPAA compliant voice message was left requesting a return call.  Instructed patient to call back at [phone].  Jagjit Riner, AAS Paralegal, [place] . Embedded Care Coordination [facility]  Care Management  [address], [place] [address] ??[email or web address]  ?? [phone]   [email or web address]

## 2020-10-24 ENCOUNTER — Telehealth: Payer: Self-pay

## 2020-10-24 NOTE — Telephone Encounter (Signed)
   Telephone encounter was:  Successful.  10/24/2020 Name: [name] MRN: [id number] DOB: 1971-05-21  Melanie Hall is a 50 y.o. year old female who is a primary care patient of Perlie Mayo, NP . The community resource team was consulted for assistance with Transportation Needs   Care guide performed the following interventions: Patient provided with information about care guide support team and interviewed to confirm resource needs Spoke with patient she stated she does not need a transportation resource at this time.              .  Follow Up Plan:  No further follow up planned at this time. The patient has been provided with needed resources.  Tanelle Lanzo, AAS Paralegal, [place] . Embedded Care Coordination [facility]  Care Management  [address], [place] [address] ??[email or web address]  ?? [phone]   [email or web address]

## 2020-10-25 ENCOUNTER — Encounter: Payer: Self-pay | Admitting: Orthopedic Surgery

## 2020-10-25 ENCOUNTER — Ambulatory Visit (INDEPENDENT_AMBULATORY_CARE_PROVIDER_SITE_OTHER): Payer: Medicaid Other | Admitting: Physician Assistant

## 2020-10-25 DIAGNOSIS — M25572 Pain in left ankle and joints of left foot: Secondary | ICD-10-CM

## 2020-10-25 NOTE — Progress Notes (Signed)
Office Visit Note   Patient: Melanie Hall           Date of Birth: [birth year]           MRN: [id number] Visit Date: 10/25/2020              Requested by: Perlie Mayo, NP [address] [place],  [place] [address] PCP: Perlie Mayo, NP  Chief Complaint  Patient presents with  . Left Ankle - Follow-up    06/01/20 left ankle fusion       HPI: Patient presents today for follow-up on her left ankle fusion.  Her biggest complaint is of swelling in her foot and ankle and have a small area of skin breakdown on her heel  Assessment & Plan: Visit Diagnoses: No diagnosis found.  Plan: We will continue with compression socks.  Have given her a order for [employer] to make extra-depth shoes with inserts and double upright brace for the left.  Follow-up in 1 month.  Have cautioned her about placing pressure on her heel  Follow-Up Instructions: No follow-ups on file.   Ortho Exam  Patient is alert, oriented, no adenopathy, well-dressed, normal affect, normal respiratory effort. Examination demonstrates well-healed surgical incision just a very thin eschar anteriorly.  Pulses are intact.  She has some scaling of the skin but no cellulitis.  She does have prominence of the foot and ankle secondary to osseous formation which was seen on the x-ray.  No signs of infection  Imaging: No results found. No images are attached to the encounter.  Labs: Lab Results  Component Value Date   HGBA1C 8.7 (H) 06/02/2020   HGBA1C 7.6 (H) 10/18/2019   HGBA1C 7.2 (H) 09/10/2019   ESRSEDRATE 45 (H) 09/10/2019   CRP 5.0 (H) 09/10/2019   LABURIC 5.7 05/27/2019   REPTSTATUS 11/13/2019 FINAL 11/12/2019   REPTSTATUS 11/17/2019 FINAL 11/12/2019   GRAMSTAIN  11/12/2019    WBC PRESENT,BOTH PMN AND MONONUCLEAR NO ORGANISMS SEEN CYTOSPIN SMEAR Performed at [facility], [place] [address], [place], [place] [address]    CULT  11/12/2019    NO GROWTH 5 DAYS Performed at [facility] [address], [place], [place] [address]    LABORGA ESCHERICHIA COLI (A) 08/26/2018     Lab Results  Component Value Date   ALBUMIN 3.3 (L) 10/17/2020   ALBUMIN 2.4 (L) 06/03/2020   ALBUMIN 3.9 12/15/2019    Lab Results  Component Value Date   MG 1.8 08/24/2019   MG 1.7 08/23/2019   MG 1.7 08/09/2019   Lab Results  Component Value Date   VD25OH 8.82 (L) 05/27/2019    No results found for: PREALBUMIN CBC EXTENDED Latest Ref Rng & Units 10/17/2020 06/23/2020 06/03/2020  WBC 4.0 - 10.5 K/uL 9.7 - 7.0  RBC 3.87 - 5.11 MIL/uL 3.61(L) - 2.05(L)  HGB 12.0 - 15.0 g/dL 11.4(L) 9.9(L) 6.4(LL)  HCT 36.0 - 46.0 % 36.3 29.0(L) 20.4(L)  PLT 150 - 400 K/uL 192 - 184  NEUTROABS 1.7 - 7.7 K/uL 8.2(H) - -  LYMPHSABS 0.7 - 4.0 K/uL 0.6(L) - -     There is no height or weight on file to calculate BMI.  Orders:  No orders of the defined types were placed in this encounter.  No orders of the defined types were placed in this encounter.    Procedures: No procedures performed  Clinical Data: No additional findings.  ROS:  All other systems negative, except  as noted in the HPI. Review of Systems  Objective: Vital Signs: LMP 12/17/2016   Specialty Comments:  No specialty comments available.  PMFS History: Patient Active Problem List   Diagnosis Date Noted  . Non-intractable vomiting 06/16/2020  . Encounter for support and coordination of transition of care 06/16/2020  . S/P ankle fusion 06/01/2020  . Pilon fracture of left tibia, closed, initial encounter   . Displaced trimalleolar fracture of left lower leg, initial encounter for closed fracture   . Abnormality of gait and mobility 05/26/2020  . Migraine 05/26/2020  . Diabetic retinopathy ([place]) 05/04/2020  . Chronic diarrhea of unknown origin 01/14/2020  . Type 2 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, with long-term current use of insulin ([place]) 12/29/2019  . Hypothyroidism 12/29/2019  . Weakness of both  lower extremities 11/12/2019  . Debility 09/10/2019  . ESRD (end stage renal disease) on dialysis ([place]) 09/10/2019  . Depression 09/10/2019  . Incontinence in female 09/10/2019  . Anemia in chronic kidney disease 08/19/2019  . MGUS (monoclonal gammopathy of unknown significance) 07/23/2019  . GERD (gastroesophageal reflux disease) 02/28/2019  . Essential hypertension 10/28/2018  . Irregular menses 09/14/2009  . Hypercholesterolemia 11/19/2008  . Type 2 diabetes with nephropathy ([place]) 07/02/2007   Past Medical History:  Diagnosis Date  . Acute cystitis without hematuria   . Acute midline thoracic back pain 12/29/2019  . Acute systolic heart failure ([place]) 10/21/2019  . Anemia   . Arthritis   . Cataracts, bilateral    surgery to remove  . CATARACTS, BILATERAL 07/02/2007   Qualifier: Diagnosis of  By: Isla Pence    . Closed fracture of left femur ([place]) 10/28/2018  . Closed fracture of right ankle 11/06/2017  . Diabetes mellitus    type 2  . Emphysematous cystitis 08/26/2018  . Encounter for gynecological examination with Papanicolaou smear of cervix 01/14/2020  . Encounter for screening fecal occult blood testing 01/14/2020  . Encounter for screening for malignant neoplasm of cervix 12/29/2019  . Encounter for screening mammogram for malignant neoplasm of breast 12/29/2019  . ESRD on hemodialysis ([place])    MWF - in [place]  . GERD (gastroesophageal reflux disease)   . Hyperlipidemia   . Hypertension   . Hypokalemia 08/26/2018  . IRREGULAR MENSES 09/14/2009   Qualifier: Diagnosis of  By: Hassell Done FNP, Tori Milks    . Loose stools 11/12/2019  . Normocytic anemia 08/26/2018  . PARONYCHIA, RIGHT GREAT TOE 07/30/2008   Qualifier: Diagnosis of  By: Hassell Done FNP, Tori Milks    . Pressure ulcer 09/10/2019  . Right arm weakness 08/08/2019  . Sprain of left ankle   . STEMI (ST elevation myocardial infarction) ([place]) 10/18/2019  . STEMI involving right coronary artery ([place]) 10/18/2019  . Stroke ([place])  10/2019  . SVD (spontaneous vaginal delivery)    x 4  . Vaginosis 08/26/2018  . Weakness 09/10/2019  . Weakness of both lower extremities     Family History  Problem Relation Age of Onset  . Diabetes Mother   . Hypertension Mother   . Bipolar disorder Mother   . Brain cancer Maternal Aunt   . Heart disease Maternal Grandmother   . Diabetes Maternal Grandmother   . Breast cancer Maternal Aunt   . Depression Father        Committed suicide  . Diabetes Sister     Past Surgical History:  Procedure Laterality Date  . A/V FISTULAGRAM Left 01/12/2020   Procedure: A/V FISTULAGRAM;  Surgeon: Trula Slade,  Butch Penny, MD;  Location: [facility];  Service: Cardiovascular;  Laterality: Left;  . A/V FISTULAGRAM N/A 03/17/2020   Procedure: A/V FISTULAGRAM - Left Arm;  Surgeon: Marty Heck, MD;  Location: [facility];  Service: Cardiovascular;  Laterality: N/A;  . A/V FISTULAGRAM Left 06/23/2020   Procedure: A/V FISTULAGRAM;  Surgeon: Marty Heck, MD;  Location: [facility];  Service: Cardiovascular;  Laterality: Left;  . ANKLE FUSION Left 06/01/2020   Procedure: LEFT ANKLE FUSION;  Surgeon: Newt Minion, MD;  Location: [facility];  Service: Orthopedics;  Laterality: Left;  . AV FISTULA PLACEMENT Left 08/17/2019   Procedure: LEFT BRACHIAL CEPHALIC ARTERIOVENOUS (AV) FISTULA;  Surgeon: Angelia Mould, MD;  Location: [facility];  Service: Vascular;  Laterality: Left;  . CORONARY STENT INTERVENTION N/A 10/18/2019   Procedure: CORONARY STENT INTERVENTION;  Surgeon: Sherren Mocha, MD;  Location: [facility];  Service: Cardiovascular;  Laterality: N/A;  . CORONARY/GRAFT ACUTE MI REVASCULARIZATION N/A 10/18/2019   Procedure: Coronary/Graft Acute MI Revascularization;  Surgeon: Sherren Mocha, MD;  Location: [facility];  Service: Cardiovascular;  Laterality: N/A;  . EYE SURGERY Bilateral    cataracts removed  . FEMUR IM NAIL Left 10/28/2018   Procedure: RETROGRADE  FEMORAL NAILING;  Surgeon: Meredith Pel, MD;  Location: [facility];  Service: Orthopedics;  Laterality: Left;  . IM NAILING FEMORAL SHAFT RETROGRADE Left 10/28/2018  . INTRAVASCULAR ULTRASOUND/IVUS N/A 10/18/2019   Procedure: Intravascular Ultrasound/IVUS;  Surgeon: Sherren Mocha, MD;  Location: [facility];  Service: Cardiovascular;  Laterality: N/A;  . IR FLUORO GUIDE CV LINE RIGHT  08/11/2019  . IR THORACENTESIS ASP PLEURAL SPACE W/IMG GUIDE  11/12/2019  . IR US GUIDE VASC ACCESS RIGHT  08/11/2019  . KNEE SURGERY Left   . LEFT HEART CATH AND CORONARY ANGIOGRAPHY N/A 10/18/2019   Procedure: LEFT HEART CATH AND CORONARY ANGIOGRAPHY;  Surgeon: Sherren Mocha, MD;  Location: [facility];  Service: Cardiovascular;  Laterality: N/A;  . PERIPHERAL VASCULAR BALLOON ANGIOPLASTY Left 01/12/2020   Procedure: PERIPHERAL VASCULAR BALLOON ANGIOPLASTY;  Surgeon: Serafina Mitchell, MD;  Location: [facility];  Service: Cardiovascular;  Laterality: Left;  AVF  . PERIPHERAL VASCULAR BALLOON ANGIOPLASTY Left 03/17/2020   Procedure: PERIPHERAL VASCULAR BALLOON ANGIOPLASTY;  Surgeon: Marty Heck, MD;  Location: [facility];  Service: Cardiovascular;  Laterality: Left;  AVF  . PERIPHERAL VASCULAR BALLOON ANGIOPLASTY Left 05/05/2020   Procedure: PERIPHERAL VASCULAR BALLOON ANGIOPLASTY;  Surgeon: Marty Heck, MD;  Location: [facility];  Service: Cardiovascular;  Laterality: Left;  arm fistula  . RADIOLOGY WITH ANESTHESIA N/A 09/15/2019   Procedure: [facility] AND LUMBER LOWER BACK PAIN;  Surgeon: Radiologist, Medication, MD;  Location: [facility];  Service: Radiology;  Laterality: N/A;  . TUBAL LIGATION     Social History   Occupational History  . Not on file  Tobacco Use  . Smoking status: Former Smoker    Packs/day: 0.25    Years: 2.00    Pack years: 0.50    Types: Cigarettes    Quit date: 1997    Years since quitting: 25.3  . Smokeless tobacco: Never Used  Vaping  Use  . Vaping Use: Never used  Substance and Sexual Activity  . Alcohol use: No  . Drug use: No  . Sexual activity: Yes    Birth control/protection: Surgical    Comment: tubal

## 2020-10-27 ENCOUNTER — Ambulatory Visit: Payer: Medicaid Other | Admitting: Nurse Practitioner

## 2020-11-08 ENCOUNTER — Ambulatory Visit: Payer: Medicaid Other | Admitting: Nurse Practitioner

## 2020-11-10 ENCOUNTER — Ambulatory Visit: Payer: Medicaid Other | Admitting: Nurse Practitioner

## 2020-11-10 ENCOUNTER — Encounter: Payer: Self-pay | Admitting: Nurse Practitioner

## 2020-11-10 ENCOUNTER — Other Ambulatory Visit: Payer: Self-pay

## 2020-11-10 DIAGNOSIS — R52 Pain, unspecified: Secondary | ICD-10-CM | POA: Diagnosis not present

## 2020-11-10 DIAGNOSIS — R269 Unspecified abnormalities of gait and mobility: Secondary | ICD-10-CM | POA: Diagnosis not present

## 2020-11-10 MED ORDER — TRAMADOL HCL 50 MG PO TABS: 50.0000 mg | ORAL_TABLET | Freq: Three times a day (TID) | ORAL | 0 refills | Status: DC | PRN
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
## 2020-11-10 NOTE — Patient Instructions (Signed)
You were unwilling to submit a urine sample today for drug testing, so tramadol was cancelled.  If you have further pain, please go to emergency department for imaging and workup for your symptoms.

## 2020-11-10 NOTE — Addendum Note (Signed)
Addended by: Demetrius Revel on: 11/10/2020 11:47 AM   Modules accepted: Orders

## 2020-11-10 NOTE — Assessment & Plan Note (Signed)
-  uses walker for ambulation -has pain with walking

## 2020-11-11 ENCOUNTER — Other Ambulatory Visit: Payer: Self-pay | Admitting: Nurse Practitioner

## 2020-11-11 LAB — CMP14+EGFR
ALT: 8 IU/L (ref 0–32)
AST: 13 IU/L (ref 0–40)
Albumin/Globulin Ratio: 1.3 (ref 1.2–2.2)
Albumin: 3.8 g/dL (ref 3.8–4.8)
Alkaline Phosphatase: 218 IU/L — ABNORMAL HIGH (ref 44–121)
BUN/Creatinine Ratio: 5 — ABNORMAL LOW (ref 9–23)
BUN: 20 mg/dL (ref 6–24)
Bilirubin Total: 0.8 mg/dL (ref 0.0–1.2)
CO2: 22 mmol/L (ref 20–29)
Calcium: 8.6 mg/dL — ABNORMAL LOW (ref 8.7–10.2)
Chloride: 94 mmol/L — ABNORMAL LOW (ref 96–106)
Creatinine, Ser: 4.05 mg/dL — ABNORMAL HIGH (ref 0.57–1.00)
Globulin, Total: 2.9 g/dL (ref 1.5–4.5)
Glucose: 199 mg/dL — ABNORMAL HIGH (ref 65–99)
Potassium: 4.1 mmol/L (ref 3.5–5.2)
Sodium: 134 mmol/L (ref 134–144)
Total Protein: 6.7 g/dL (ref 6.0–8.5)
eGFR: 13 mL/min/{1.73_m2} — ABNORMAL LOW (ref >59)

## 2020-11-11 LAB — CBC WITH DIFFERENTIAL/PLATELET
Basophils Absolute: 0.1 10*3/uL (ref 0.0–0.2)
Basos: 1 %
EOS (ABSOLUTE): 0.6 10*3/uL — ABNORMAL HIGH (ref 0.0–0.4)
Eos: 7 %
Hematocrit: 32.6 % — ABNORMAL LOW (ref 34.0–46.6)
Hemoglobin: 10.4 g/dL — ABNORMAL LOW (ref 11.1–15.9)
Immature Grans (Abs): 0.1 10*3/uL (ref 0.0–0.1)
Immature Granulocytes: 1 %
Lymphocytes Absolute: 1.9 10*3/uL (ref 0.7–3.1)
Lymphs: 22 %
MCH: 31.9 pg (ref 26.6–33.0)
MCHC: 31.9 g/dL (ref 31.5–35.7)
MCV: 100 fL — ABNORMAL HIGH (ref 79–97)
Monocytes Absolute: 0.9 10*3/uL (ref 0.1–0.9)
Monocytes: 10 %
Neutrophils Absolute: 5.1 10*3/uL (ref 1.4–7.0)
Neutrophils: 59 %
Platelets: 212 10*3/uL (ref 150–450)
RBC: 3.26 x10E6/uL — ABNORMAL LOW (ref 3.77–5.28)
RDW: 15.6 % — ABNORMAL HIGH (ref 11.7–15.4)
WBC: 8.6 10*3/uL (ref 3.4–10.8)

## 2020-11-11 NOTE — Progress Notes (Signed)
She is on dialysis so she has, as expected, anemia and low calcium.  Her alkaline phosphatase is elevated, but this is common in people with end stage renal disease. She can work with her nephrologist to correct this.  IF her pain persists, she should go to the ED for further imaging and evaluation.

## 2020-11-14 ENCOUNTER — Encounter: Payer: Self-pay | Admitting: Physical Medicine & Rehabilitation

## 2020-11-14 ENCOUNTER — Telehealth: Payer: Self-pay

## 2020-11-14 NOTE — Telephone Encounter (Signed)
Patient called asking when and what medicines will be sent into her pharmacy from her visit last week. [place]. Patient call back # [phone].

## 2020-11-14 NOTE — Telephone Encounter (Signed)
Her tramadol was cancelled because she refused a drug test.

## 2020-11-14 NOTE — Telephone Encounter (Signed)
Left message

## 2020-11-16 NOTE — Telephone Encounter (Signed)
Left message for patient to call our office or come in to do urine drug screen for any prescription medication for pain can be provided.

## 2020-11-19 LAB — DRUG SCREEN 12+ALCOHOL+CRT, UR
Amphetamines, Urine: NEGATIVE ng/mL
BENZODIAZ UR QL: NEGATIVE ng/mL
Barbiturate: NEGATIVE ng/mL
Cannabinoids: NEGATIVE ng/mL
Cocaine (Metabolite): NEGATIVE ng/mL
Creatinine, Urine: 50.9 mg/dL (ref 20.0–300.0)
Ethanol, Urine: NEGATIVE %
Meperidine: NEGATIVE ng/mL
Methadone: NEGATIVE ng/mL
OPIATE SCREEN URINE: NEGATIVE ng/mL
Oxycodone/Oxymorphone, Urine: NEGATIVE ng/mL
Phencyclidine: NEGATIVE ng/mL
Propoxyphene: NEGATIVE ng/mL
Tramadol: NEGATIVE ng/mL

## 2020-11-22 ENCOUNTER — Encounter: Payer: Self-pay | Admitting: Physician Assistant

## 2020-11-22 ENCOUNTER — Ambulatory Visit: Payer: Medicaid Other | Admitting: Physician Assistant

## 2020-11-22 ENCOUNTER — Ambulatory Visit (INDEPENDENT_AMBULATORY_CARE_PROVIDER_SITE_OTHER): Payer: Medicaid Other | Admitting: Physician Assistant

## 2020-11-22 ENCOUNTER — Other Ambulatory Visit: Payer: Self-pay

## 2020-11-22 DIAGNOSIS — S82872A Displaced pilon fracture of left tibia, initial encounter for closed fracture: Secondary | ICD-10-CM

## 2020-11-22 NOTE — Progress Notes (Signed)
Office Visit Note   Patient: Melanie Hall           Date of Birth: Apr 02, 1971           MRN: [id number] Visit Date: 11/22/2020              Requested by: Perlie Mayo, NP [address] [place],  [place] [address] PCP: Perlie Mayo, NP  No chief complaint on file.     HPI: Patient presents in follow-up today for her left ankle.  She is overall doing better.  She still has some pain but "deals with it ".  She did lose her prescription for her brace and is asking for a new one.  Assessment & Plan: Visit Diagnoses: No diagnosis found.  Plan: Patient will follow-up once she gets her brace and extra-depth shoes this was provided for her today.  Follow-Up Instructions: No follow-ups on file.   Ortho Exam  Patient is alert, oriented, no adenopathy, well-dressed, normal affect, normal respiratory effort. Well-healed surgical incision.  Foot is warm with good palpable pulse.  No open areas previous callus on her heel is completely healed.  Compartments soft and compressible still moderate soft tissue swelling around the ankle but no cellulitis no swelling extending into the leg  Imaging: No results found. No images are attached to the encounter.  Labs: Lab Results  Component Value Date   HGBA1C 8.7 (H) 06/02/2020   HGBA1C 7.6 (H) 10/18/2019   HGBA1C 7.2 (H) 09/10/2019   ESRSEDRATE 45 (H) 09/10/2019   CRP 5.0 (H) 09/10/2019   LABURIC 5.7 05/27/2019   REPTSTATUS 11/13/2019 FINAL 11/12/2019   REPTSTATUS 11/17/2019 FINAL 11/12/2019   GRAMSTAIN  11/12/2019    WBC PRESENT,BOTH PMN AND MONONUCLEAR NO ORGANISMS SEEN CYTOSPIN SMEAR Performed at [facility], [place] [address], [place],  [address]    CULT  11/12/2019    NO GROWTH 5 DAYS Performed at [facility] [address] [place],  [address]    LABORGA ESCHERICHIA COLI (A) 08/26/2018     Lab Results  Component Value Date   ALBUMIN 3.8 11/10/2020   ALBUMIN 3.3 (L) 10/17/2020   ALBUMIN 2.4  (L) 06/03/2020    Lab Results  Component Value Date   MG 1.8 08/24/2019   MG 1.7 08/23/2019   MG 1.7 08/09/2019   Lab Results  Component Value Date   VD25OH 8.82 (L) 05/27/2019    No results found for: PREALBUMIN CBC EXTENDED Latest Ref Rng & Units 11/10/2020 10/17/2020 06/23/2020  WBC 3.4 - 10.8 x10E3/uL 8.6 9.7 -  RBC 3.77 - 5.28 x10E6/uL 3.26(L) 3.61(L) -  HGB 11.1 - 15.9 g/dL 10.4(L) 11.4(L) 9.9(L)  HCT 34.0 - 46.6 % 32.6(L) 36.3 29.0(L)  PLT 150 - 450 x10E3/uL 212 192 -  NEUTROABS 1.4 - 7.0 x10E3/uL 5.1 8.2(H) -  LYMPHSABS 0.7 - 3.1 x10E3/uL 1.9 0.6(L) -     There is no height or weight on file to calculate BMI.  Orders:  No orders of the defined types were placed in this encounter.  No orders of the defined types were placed in this encounter.    Procedures: No procedures performed  Clinical Data: No additional findings.  ROS:  All other systems negative, except as noted in the HPI. Review of Systems  Objective: Vital Signs: LMP 12/17/2016   Specialty Comments:  No specialty comments available.  PMFS History: Patient Active Problem List   Diagnosis Date Noted  . Pain  and tenderness 11/10/2020  . Non-intractable vomiting 06/16/2020  . Encounter for support and coordination of transition of care 06/16/2020  . S/P ankle fusion 06/01/2020  . Pilon fracture of left tibia, closed, initial encounter   . Displaced trimalleolar fracture of left lower leg, initial encounter for closed fracture   . Abnormality of gait and mobility 05/26/2020  . Migraine 05/26/2020  . Diabetic retinopathy ([place]) 05/04/2020  . Chronic diarrhea of unknown origin 01/14/2020  . Type 2 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, with long-term current use of insulin ([place]) 12/29/2019  . Hypothyroidism 12/29/2019  . Weakness of both lower extremities 11/12/2019  . Debility 09/10/2019  . ESRD (end stage renal disease) on dialysis ([place]) 09/10/2019  . Depression  09/10/2019  . Incontinence in female 09/10/2019  . Anemia in chronic kidney disease 08/19/2019  . MGUS (monoclonal gammopathy of unknown significance) 07/23/2019  . GERD (gastroesophageal reflux disease) 02/28/2019  . Essential hypertension 10/28/2018  . Irregular menses 09/14/2009  . Hypercholesterolemia 11/19/2008  . Type 2 diabetes with nephropathy ([place]) 07/02/2007   Past Medical History:  Diagnosis Date  . Acute cystitis without hematuria   . Acute midline thoracic back pain 12/29/2019  . Acute systolic heart failure ([place]) 10/21/2019  . Anemia   . Arthritis   . Cataracts, bilateral    surgery to remove  . CATARACTS, BILATERAL 07/02/2007   Qualifier: Diagnosis of  By: Isla Pence    . Closed fracture of left femur ([place]) 10/28/2018  . Closed fracture of right ankle 11/06/2017  . Diabetes mellitus    type 2  . Emphysematous cystitis 08/26/2018  . Encounter for gynecological examination with Papanicolaou smear of cervix 01/14/2020  . Encounter for screening fecal occult blood testing 01/14/2020  . Encounter for screening for malignant neoplasm of cervix 12/29/2019  . Encounter for screening mammogram for malignant neoplasm of breast 12/29/2019  . ESRD on hemodialysis ([place])    MWF - in [place]  . GERD (gastroesophageal reflux disease)   . Hyperlipidemia   . Hypertension   . Hypokalemia 08/26/2018  . IRREGULAR MENSES 09/14/2009   Qualifier: Diagnosis of  By: Hassell Done FNP, Tori Milks    . Loose stools 11/12/2019  . Normocytic anemia 08/26/2018  . PARONYCHIA, RIGHT GREAT TOE 07/30/2008   Qualifier: Diagnosis of  By: Hassell Done FNP, Tori Milks    . Pressure ulcer 09/10/2019  . Right arm weakness 08/08/2019  . Sprain of left ankle   . STEMI (ST elevation myocardial infarction) ([place]) 10/18/2019  . STEMI involving right coronary artery ([place]) 10/18/2019  . Stroke ([place]) 10/2019  . SVD (spontaneous vaginal delivery)    x 4  . Vaginosis 08/26/2018  . Weakness 09/10/2019  . Weakness of both lower  extremities     Family History  Problem Relation Age of Onset  . Diabetes Mother   . Hypertension Mother   . Bipolar disorder Mother   . Brain cancer Maternal Aunt   . Heart disease Maternal Grandmother   . Diabetes Maternal Grandmother   . Breast cancer Maternal Aunt   . Depression Father        Committed suicide  . Diabetes Sister     Past Surgical History:  Procedure Laterality Date  . A/V FISTULAGRAM Left 01/12/2020   Procedure: A/V FISTULAGRAM;  Surgeon: Serafina Mitchell, MD;  Location: [facility];  Service: Cardiovascular;  Laterality: Left;  . A/V FISTULAGRAM N/A 03/17/2020   Procedure: A/V FISTULAGRAM - Left Arm;  Surgeon: Carlis Abbott,  Gwenyth Allegra, MD;  Location: [facility];  Service: Cardiovascular;  Laterality: N/A;  . A/V FISTULAGRAM Left 06/23/2020   Procedure: A/V FISTULAGRAM;  Surgeon: Marty Heck, MD;  Location: [facility];  Service: Cardiovascular;  Laterality: Left;  . ANKLE FUSION Left 06/01/2020   Procedure: LEFT ANKLE FUSION;  Surgeon: Newt Minion, MD;  Location: [facility];  Service: Orthopedics;  Laterality: Left;  . AV FISTULA PLACEMENT Left 08/17/2019   Procedure: LEFT BRACHIAL CEPHALIC ARTERIOVENOUS (AV) FISTULA;  Surgeon: Angelia Mould, MD;  Location: [facility];  Service: Vascular;  Laterality: Left;  . CORONARY STENT INTERVENTION N/A 10/18/2019   Procedure: CORONARY STENT INTERVENTION;  Surgeon: Sherren Mocha, MD;  Location: [facility];  Service: Cardiovascular;  Laterality: N/A;  . CORONARY/GRAFT ACUTE MI REVASCULARIZATION N/A 10/18/2019   Procedure: Coronary/Graft Acute MI Revascularization;  Surgeon: Sherren Mocha, MD;  Location: [facility];  Service: Cardiovascular;  Laterality: N/A;  . EYE SURGERY Bilateral    cataracts removed  . FEMUR IM NAIL Left 10/28/2018   Procedure: RETROGRADE FEMORAL NAILING;  Surgeon: Meredith Pel, MD;  Location: [facility];  Service: Orthopedics;  Laterality: Left;  . IM NAILING  FEMORAL SHAFT RETROGRADE Left 10/28/2018  . INTRAVASCULAR ULTRASOUND/IVUS N/A 10/18/2019   Procedure: Intravascular Ultrasound/IVUS;  Surgeon: Sherren Mocha, MD;  Location: [facility];  Service: Cardiovascular;  Laterality: N/A;  . IR FLUORO GUIDE CV LINE RIGHT  08/11/2019  . IR THORACENTESIS ASP PLEURAL SPACE W/IMG GUIDE  11/12/2019  . IR US GUIDE VASC ACCESS RIGHT  08/11/2019  . KNEE SURGERY Left   . LEFT HEART CATH AND CORONARY ANGIOGRAPHY N/A 10/18/2019   Procedure: LEFT HEART CATH AND CORONARY ANGIOGRAPHY;  Surgeon: Sherren Mocha, MD;  Location: [facility];  Service: Cardiovascular;  Laterality: N/A;  . PERIPHERAL VASCULAR BALLOON ANGIOPLASTY Left 01/12/2020   Procedure: PERIPHERAL VASCULAR BALLOON ANGIOPLASTY;  Surgeon: Serafina Mitchell, MD;  Location: [facility];  Service: Cardiovascular;  Laterality: Left;  AVF  . PERIPHERAL VASCULAR BALLOON ANGIOPLASTY Left 03/17/2020   Procedure: PERIPHERAL VASCULAR BALLOON ANGIOPLASTY;  Surgeon: Marty Heck, MD;  Location: [facility];  Service: Cardiovascular;  Laterality: Left;  AVF  . PERIPHERAL VASCULAR BALLOON ANGIOPLASTY Left 05/05/2020   Procedure: PERIPHERAL VASCULAR BALLOON ANGIOPLASTY;  Surgeon: Marty Heck, MD;  Location: [facility];  Service: Cardiovascular;  Laterality: Left;  arm fistula  . RADIOLOGY WITH ANESTHESIA N/A 09/15/2019   Procedure: [facility] AND LUMBER LOWER BACK PAIN;  Surgeon: Radiologist, Medication, MD;  Location: [facility];  Service: Radiology;  Laterality: N/A;  . TUBAL LIGATION     Social History   Occupational History  . Not on file  Tobacco Use  . Smoking status: Former Smoker    Packs/day: 0.25    Years: 2.00    Pack years: 0.50    Types: Cigarettes    Quit date: 1997    Years since quitting: 25.3  . Smokeless tobacco: Never Used  Vaping Use  . Vaping Use: Never used  Substance and Sexual Activity  . Alcohol use: No  . Drug use: No  . Sexual activity: Yes     Birth control/protection: Surgical    Comment: tubal

## 2020-11-23 ENCOUNTER — Telehealth: Payer: Self-pay | Admitting: Physician Assistant

## 2020-11-23 NOTE — Telephone Encounter (Signed)
Pt called stating she had an appt on 11/22/20 and was supposed to have a pain rx called in but she's not sure of the name. Pt would like to have this called in and then be notified when that's done.   [phone]

## 2020-11-23 NOTE — Telephone Encounter (Signed)
Called and lm on vm to advise that per PA did not discuss rx yesterday at visit she did not call in rx for her. Had advised that she was "dealing with it" and not using rx. To call with any questions.

## 2020-11-24 ENCOUNTER — Telehealth: Payer: Self-pay | Admitting: Physician Assistant

## 2020-11-24 NOTE — Telephone Encounter (Signed)
Patient states that [facility] needs a letter from our office stating that they can come pick up her Oxygen tank.   She gave me two fax numbers: [phone] and [phone]

## 2020-11-24 NOTE — Telephone Encounter (Signed)
Left message to call back  

## 2020-11-29 ENCOUNTER — Other Ambulatory Visit: Payer: Self-pay | Admitting: Physician Assistant

## 2020-11-29 ENCOUNTER — Other Ambulatory Visit: Payer: Self-pay | Admitting: Family Medicine

## 2020-11-29 ENCOUNTER — Encounter: Payer: Self-pay | Admitting: Physical Medicine & Rehabilitation

## 2020-11-29 ENCOUNTER — Encounter: Payer: Medicaid Other | Attending: Physical Medicine & Rehabilitation | Admitting: Physical Medicine & Rehabilitation

## 2020-11-29 ENCOUNTER — Other Ambulatory Visit: Payer: Self-pay

## 2020-11-29 VITALS — BP 132/66 | HR 87 | Temp 98.0°F | Ht 63.0 in | Wt 148.0 lb

## 2020-11-29 DIAGNOSIS — M549 Dorsalgia, unspecified: Secondary | ICD-10-CM | POA: Insufficient documentation

## 2020-11-29 DIAGNOSIS — E1121 Type 2 diabetes mellitus with diabetic nephropathy: Secondary | ICD-10-CM

## 2020-11-29 MED ORDER — TRAMADOL HCL 50 MG PO TABS: 50.0000 mg | ORAL_TABLET | Freq: Four times a day (QID) | ORAL | 0 refills | Status: DC | PRN

## 2020-11-29 NOTE — Patient Instructions (Signed)
May go to [facility] for Xray any time, PT will call to schedule appt
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
## 2020-11-30 ENCOUNTER — Other Ambulatory Visit: Payer: Self-pay | Admitting: *Deleted

## 2020-11-30 ENCOUNTER — Telehealth: Payer: Self-pay | Admitting: *Deleted

## 2020-11-30 MED ORDER — TRAMADOL HCL 50 MG PO TABS: 50.0000 mg | ORAL_TABLET | Freq: Four times a day (QID) | ORAL | 0 refills | Status: DC | PRN

## 2020-11-30 NOTE — Telephone Encounter (Signed)
Melanie Hall called and said that the doctor was supposed to put her on some pain medication for 6 weeks and some muscle relaxer and they were supposed to go to the [facility] in [place], and she was calling to see if that was done. I see a small amt of tramadol was sent to [facility]. Please advise

## 2020-12-01 ENCOUNTER — Encounter: Payer: Self-pay | Admitting: *Deleted

## 2020-12-01 NOTE — Telephone Encounter (Signed)
Patient called stating a muscle relaxer was suppose to be called in for her. There is no mention in last note of a muscle relaxer.

## 2020-12-01 NOTE — Telephone Encounter (Signed)
Letter faxed to both numbers per Richardson Dopp PAC .Adonis Housekeeper

## 2020-12-01 NOTE — Telephone Encounter (Addendum)
Reviewed pt's chart and do not see where we ordered O2 therapy . Per pt has never used and has been trying to get equipment picked up for 3 months . Will forward to Richardson Dopp [facility] for review and recommendations./cy

## 2020-12-01 NOTE — Telephone Encounter (Signed)
See note from 11/03/19 O2 ordered at that visist. Please call [facility] and cancel O2 order so that equipment can be picked up. Richardson Dopp, PA-C    12/01/2020 9:14 AM

## 2020-12-20 ENCOUNTER — Encounter (HOSPITAL_COMMUNITY): Payer: Self-pay | Admitting: Physical Therapy

## 2020-12-20 ENCOUNTER — Other Ambulatory Visit: Payer: Self-pay

## 2020-12-20 ENCOUNTER — Ambulatory Visit (HOSPITAL_COMMUNITY): Payer: Medicaid Other | Attending: Orthopaedic Surgery | Admitting: Physical Therapy

## 2020-12-20 DIAGNOSIS — M6281 Muscle weakness (generalized): Secondary | ICD-10-CM | POA: Diagnosis present

## 2020-12-20 DIAGNOSIS — R2689 Other abnormalities of gait and mobility: Secondary | ICD-10-CM | POA: Diagnosis present

## 2020-12-20 DIAGNOSIS — M546 Pain in thoracic spine: Secondary | ICD-10-CM | POA: Diagnosis not present

## 2020-12-20 DIAGNOSIS — M545 Low back pain, unspecified: Secondary | ICD-10-CM | POA: Diagnosis present

## 2020-12-20 DIAGNOSIS — R296 Repeated falls: Secondary | ICD-10-CM | POA: Insufficient documentation

## 2020-12-20 DIAGNOSIS — R262 Difficulty in walking, not elsewhere classified: Secondary | ICD-10-CM | POA: Diagnosis present

## 2020-12-20 DIAGNOSIS — G8929 Other chronic pain: Secondary | ICD-10-CM | POA: Insufficient documentation
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
## 2020-12-25 ENCOUNTER — Other Ambulatory Visit: Payer: Self-pay | Admitting: Nurse Practitioner

## 2020-12-25 DIAGNOSIS — E1121 Type 2 diabetes mellitus with diabetic nephropathy: Secondary | ICD-10-CM

## 2020-12-27 ENCOUNTER — Other Ambulatory Visit: Payer: Self-pay

## 2020-12-27 ENCOUNTER — Telehealth: Payer: Self-pay

## 2020-12-27 ENCOUNTER — Encounter: Payer: Self-pay | Admitting: Family

## 2020-12-27 ENCOUNTER — Encounter (HOSPITAL_COMMUNITY): Payer: Medicaid Other

## 2020-12-27 ENCOUNTER — Ambulatory Visit (INDEPENDENT_AMBULATORY_CARE_PROVIDER_SITE_OTHER): Payer: Medicaid Other | Admitting: Family

## 2020-12-27 DIAGNOSIS — E1121 Type 2 diabetes mellitus with diabetic nephropathy: Secondary | ICD-10-CM

## 2020-12-27 MED ORDER — TRAMADOL HCL 50 MG PO TABS: 50.0000 mg | ORAL_TABLET | Freq: Two times a day (BID) | ORAL | 0 refills | Status: DC

## 2020-12-27 MED ORDER — GABAPENTIN 300 MG PO CAPS: 1.0000 | ORAL_CAPSULE | Freq: Three times a day (TID) | ORAL | 0 refills | Status: DC

## 2020-12-27 NOTE — Progress Notes (Signed)
Office Visit Note   Patient: Melanie Hall           Date of Birth: 20-Dec-1970           MRN: [id number] Visit Date: 12/27/2020              Requested by: Perlie Mayo, NP [address] [place],  [place] [address] PCP: Perlie Mayo, NP  Chief Complaint  Patient presents with   Left Ankle - Follow-up    06/01/20 left ankle fusion       HPI: The patient is a 50 year old woman who presents today in follow-up for ongoing pain to her left foot and ankle.  She is status post left ankle fusion November 24 of last year.  She has been full weightbearing with a rolling walker and regular shoewear.  She has not yet been able to pick up her extra-depth shoes with custom orthotics and a double upright brace for the left.  Her main concern today is waxing and waning swelling of her left foot and ankle and some shooting and burning pain over the plantar aspect of the left foot.  There are no aggravating or relieving factors she does state that she uses tramadol or oxycodone to relieve her pain she would like a refill of this to use on days where her pain is great.  She has been using gabapentin 300 mg at bedtime for her bilateral neuropathy.  She does get some relief but wonders if a higher dose would be more helpful.  Assessment & Plan: Visit Diagnoses:  1. Type 2 diabetes with nephropathy ([place])     Plan: We will increase her gabapentin to 3 times a day.  Provided a refill of her tramadol.  Encouraged her to continue using compression garments and elevate for swelling  Follow-Up Instructions: Return in about 6 weeks (around 02/07/2021), or if symptoms worsen or fail to improve.   Ortho Exam  Patient is alert, oriented, no adenopathy, well-dressed, normal affect, normal respiratory effort. Well-healed surgical incision.  Foot is warm with good palpable pulse.  No open areas.  Compartments soft and compressible. still moderate soft tissue swelling around the ankle. no cellulitis no swelling  extending into the leg   Imaging: No results found. No images are attached to the encounter.  Labs: Lab Results  Component Value Date   HGBA1C 8.7 (H) 06/02/2020   HGBA1C 7.6 (H) 10/18/2019   HGBA1C 7.2 (H) 09/10/2019   ESRSEDRATE 45 (H) 09/10/2019   CRP 5.0 (H) 09/10/2019   LABURIC 5.7 05/27/2019   REPTSTATUS 11/13/2019 FINAL 11/12/2019   REPTSTATUS 11/17/2019 FINAL 11/12/2019   GRAMSTAIN  11/12/2019    WBC PRESENT,BOTH PMN AND MONONUCLEAR NO ORGANISMS SEEN CYTOSPIN SMEAR Performed at [facility], [place] [address] [place], [place] [address]    CULT  11/12/2019    NO GROWTH 5 DAYS Performed at [facility] [address] [place],  [address]    LABORGA ESCHERICHIA COLI (A) 08/26/2018     Lab Results  Component Value Date   ALBUMIN 3.8 11/10/2020   ALBUMIN 3.3 (L) 10/17/2020   ALBUMIN 2.4 (L) 06/03/2020    Lab Results  Component Value Date   MG 1.8 08/24/2019   MG 1.7 08/23/2019   MG 1.7 08/09/2019   Lab Results  Component Value Date   VD25OH 8.82 (L) 05/27/2019    No results found for: PREALBUMIN CBC EXTENDED Latest Ref Rng & Units 11/10/2020 10/17/2020  06/23/2020  WBC 3.4 - 10.8 x10E3/uL 8.6 9.7 -  RBC 3.77 - 5.28 x10E6/uL 3.26(L) 3.61(L) -  HGB 11.1 - 15.9 g/dL 10.4(L) 11.4(L) 9.9(L)  HCT 34.0 - 46.6 % 32.6(L) 36.3 29.0(L)  PLT 150 - 450 x10E3/uL 212 192 -  NEUTROABS 1.4 - 7.0 x10E3/uL 5.1 8.2(H) -  LYMPHSABS 0.7 - 3.1 x10E3/uL 1.9 0.6(L) -     There is no height or weight on file to calculate BMI.  Orders:  No orders of the defined types were placed in this encounter.  Meds ordered this encounter  Medications   gabapentin (NEURONTIN) 300 MG capsule    Sig: Take 1 capsule (300 mg total) by mouth 3 (three) times daily.    Dispense:  90 capsule    Refill:  0   traMADol (ULTRAM) 50 MG tablet    Sig: Take 1 tablet (50 mg total) by mouth 2 (two) times daily.    Dispense:  28 tablet    Refill:  0     Procedures: No  procedures performed  Clinical Data: No additional findings.  ROS:  All other systems negative, except as noted in the HPI. Review of Systems  Constitutional:  Negative for chills and fever.  Cardiovascular:  Positive for leg swelling.  Musculoskeletal:  Positive for joint swelling and myalgias.  Skin:  Negative for color change.  Neurological:  Positive for numbness. Negative for weakness.   Objective: Vital Signs: LMP 12/17/2016   Specialty Comments:  No specialty comments available.  PMFS History: Patient Active Problem List   Diagnosis Date Noted   Pain and tenderness 11/10/2020   Non-intractable vomiting 06/16/2020   Encounter for support and coordination of transition of care 06/16/2020   S/P ankle fusion 06/01/2020   Pilon fracture of left tibia, closed, initial encounter    Displaced trimalleolar fracture of left lower leg, initial encounter for closed fracture    Abnormality of gait and mobility 05/26/2020   Migraine 05/26/2020   Diabetic retinopathy ([place]) 05/04/2020   Chronic diarrhea of unknown origin 01/14/2020   Type 2 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, with long-term current use of insulin ([place]) 12/29/2019   Hypothyroidism 12/29/2019   Weakness of both lower extremities 11/12/2019   Debility 09/10/2019   ESRD (end stage renal disease) on dialysis ([place]) 09/10/2019   Depression 09/10/2019   Incontinence in female 09/10/2019   Anemia in chronic kidney disease 08/19/2019   MGUS (monoclonal gammopathy of unknown significance) 07/23/2019   GERD (gastroesophageal reflux disease) 02/28/2019   Essential hypertension 10/28/2018   Irregular menses 09/14/2009   Hypercholesterolemia 11/19/2008   Type 2 diabetes with nephropathy ([place]) 07/02/2007   Past Medical History:  Diagnosis Date   Acute cystitis without hematuria    Acute midline thoracic back pain 2/69/4854   Acute systolic heart failure ([place]) 10/21/2019   Anemia     Arthritis    Cataracts, bilateral    surgery to remove   CATARACTS, BILATERAL 07/02/2007   Qualifier: Diagnosis of  By: Isla Pence     Closed fracture of left femur ([place]) 10/28/2018   Closed fracture of right ankle 11/06/2017   Diabetes mellitus    type 2   Emphysematous cystitis 08/26/2018   Encounter for gynecological examination with Papanicolaou smear of cervix 01/14/2020   Encounter for screening fecal occult blood testing 01/14/2020   Encounter for screening for malignant neoplasm of cervix 12/29/2019   Encounter for screening mammogram for malignant neoplasm of breast 12/29/2019  ESRD on hemodialysis ([place])    MWF - in [place]   GERD (gastroesophageal reflux disease)    Hyperlipidemia    Hypertension    Hypokalemia 08/26/2018   IRREGULAR MENSES 09/14/2009   Qualifier: Diagnosis of  By: Hassell Done FNP, Nykedtra     Loose stools 11/12/2019   Normocytic anemia 08/26/2018   PARONYCHIA, RIGHT GREAT TOE 07/30/2008   Qualifier: Diagnosis of  By: Hassell Done FNP, Nykedtra     Pressure ulcer 09/10/2019   Right arm weakness 08/08/2019   Sprain of left ankle    STEMI (ST elevation myocardial infarction) ([place]) 10/18/2019   STEMI involving right coronary artery ([place]) 10/18/2019   Stroke ([place]) 10/2019   SVD (spontaneous vaginal delivery)    x 4   Vaginosis 08/26/2018   Weakness 09/10/2019   Weakness of both lower extremities     Family History  Problem Relation Age of Onset   Diabetes Mother    Hypertension Mother    Bipolar disorder Mother    Brain cancer Maternal Aunt    Heart disease Maternal Grandmother    Diabetes Maternal Grandmother    Breast cancer Maternal Aunt    Depression Father        Committed suicide   Diabetes Sister     Past Surgical History:  Procedure Laterality Date   A/V FISTULAGRAM Left 01/12/2020   Procedure: A/V FISTULAGRAM;  Surgeon: Serafina Mitchell, MD;  Location: [facility];  Service: Cardiovascular;  Laterality: Left;   A/V FISTULAGRAM N/A 03/17/2020    Procedure: A/V FISTULAGRAM - Left Arm;  Surgeon: Marty Heck, MD;  Location: [facility];  Service: Cardiovascular;  Laterality: N/A;   A/V FISTULAGRAM Left 06/23/2020   Procedure: A/V FISTULAGRAM;  Surgeon: Marty Heck, MD;  Location: [facility];  Service: Cardiovascular;  Laterality: Left;   ANKLE FUSION Left 06/01/2020   Procedure: LEFT ANKLE FUSION;  Surgeon: Newt Minion, MD;  Location: [facility];  Service: Orthopedics;  Laterality: Left;   AV FISTULA PLACEMENT Left 08/17/2019   Procedure: LEFT BRACHIAL CEPHALIC ARTERIOVENOUS (AV) FISTULA;  Surgeon: Angelia Mould, MD;  Location: [facility];  Service: Vascular;  Laterality: Left;   CORONARY STENT INTERVENTION N/A 10/18/2019   Procedure: CORONARY STENT INTERVENTION;  Surgeon: Sherren Mocha, MD;  Location: [facility];  Service: Cardiovascular;  Laterality: N/A;   CORONARY/GRAFT ACUTE MI REVASCULARIZATION N/A 10/18/2019   Procedure: Coronary/Graft Acute MI Revascularization;  Surgeon: Sherren Mocha, MD;  Location: [facility];  Service: Cardiovascular;  Laterality: N/A;   EYE SURGERY Bilateral    cataracts removed   FEMUR IM NAIL Left 10/28/2018   Procedure: RETROGRADE FEMORAL NAILING;  Surgeon: Meredith Pel, MD;  Location: [facility];  Service: Orthopedics;  Laterality: Left;   IM NAILING FEMORAL SHAFT RETROGRADE Left 10/28/2018   INTRAVASCULAR ULTRASOUND/IVUS N/A 10/18/2019   Procedure: Intravascular Ultrasound/IVUS;  Surgeon: Sherren Mocha, MD;  Location: [facility];  Service: Cardiovascular;  Laterality: N/A;   IR FLUORO GUIDE CV LINE RIGHT  08/11/2019   IR THORACENTESIS ASP PLEURAL SPACE W/IMG GUIDE  11/12/2019   IR US GUIDE VASC ACCESS RIGHT  08/11/2019   KNEE SURGERY Left    LEFT HEART CATH AND CORONARY ANGIOGRAPHY N/A 10/18/2019   Procedure: LEFT HEART CATH AND CORONARY ANGIOGRAPHY;  Surgeon: Sherren Mocha, MD;  Location: [facility];  Service: Cardiovascular;  Laterality:  N/A;   PERIPHERAL VASCULAR BALLOON ANGIOPLASTY Left 01/12/2020   Procedure:  PERIPHERAL VASCULAR BALLOON ANGIOPLASTY;  Surgeon: Serafina Mitchell, MD;  Location: [facility];  Service: Cardiovascular;  Laterality: Left;  AVF   PERIPHERAL VASCULAR BALLOON ANGIOPLASTY Left 03/17/2020   Procedure: PERIPHERAL VASCULAR BALLOON ANGIOPLASTY;  Surgeon: Marty Heck, MD;  Location: [facility];  Service: Cardiovascular;  Laterality: Left;  AVF   PERIPHERAL VASCULAR BALLOON ANGIOPLASTY Left 05/05/2020   Procedure: PERIPHERAL VASCULAR BALLOON ANGIOPLASTY;  Surgeon: Marty Heck, MD;  Location: [facility];  Service: Cardiovascular;  Laterality: Left;  arm fistula   RADIOLOGY WITH ANESTHESIA N/A 09/15/2019   Procedure: [facility] AND LUMBER LOWER BACK PAIN;  Surgeon: Radiologist, Medication, MD;  Location: [facility];  Service: Radiology;  Laterality: N/A;   TUBAL LIGATION     Social History   Occupational History   Not on file  Tobacco Use   Smoking status: Former    Packs/day: 0.25    Years: 2.00    Pack years: 0.50    Types: Cigarettes    Quit date: 1997    Years since quitting: 25.4   Smokeless tobacco: Never  Vaping Use   Vaping Use: Never used  Substance and Sexual Activity   Alcohol use: No   Drug use: No   Sexual activity: Yes    Birth control/protection: Surgical    Comment: tubal

## 2020-12-27 NOTE — Telephone Encounter (Signed)
[place] tracks approval for Tramadol entered through portal and approved today.

## 2020-12-29 ENCOUNTER — Ambulatory Visit (HOSPITAL_COMMUNITY): Payer: Medicaid Other | Admitting: Physical Therapy

## 2021-01-03 ENCOUNTER — Ambulatory Visit (HOSPITAL_COMMUNITY): Payer: Medicaid Other

## 2021-01-03 ENCOUNTER — Other Ambulatory Visit: Payer: Self-pay

## 2021-01-03 ENCOUNTER — Ambulatory Visit: Payer: Medicaid Other | Admitting: Orthopedic Surgery

## 2021-01-03 DIAGNOSIS — R296 Repeated falls: Secondary | ICD-10-CM

## 2021-01-03 DIAGNOSIS — M546 Pain in thoracic spine: Secondary | ICD-10-CM

## 2021-01-03 DIAGNOSIS — R262 Difficulty in walking, not elsewhere classified: Secondary | ICD-10-CM

## 2021-01-03 DIAGNOSIS — M6281 Muscle weakness (generalized): Secondary | ICD-10-CM

## 2021-01-03 DIAGNOSIS — G8929 Other chronic pain: Secondary | ICD-10-CM

## 2021-01-03 DIAGNOSIS — R2689 Other abnormalities of gait and mobility: Secondary | ICD-10-CM

## 2021-01-03 NOTE — Patient Instructions (Signed)
Access Code: [email or web address] URL: [email or web address] Date: 01/03/2021 Prepared by: Sherlyn Lees  Exercises Shoulder External Rotation and Scapular Retraction with Resistance - 1 x daily - 7 x weekly - 3 sets - 10 reps Standing Shoulder Horizontal Abduction with Resistance - 1 x daily - 7 x weekly - 3 sets - 10 reps Seated Thoracic Lumbar Extension - 1 x daily - 7 x weekly - 3 sets - 10 reps - 2-5 sec hold

## 2021-01-03 NOTE — Therapy (Signed)
[facility] [place], [place], [address] Phone: [phone]   Fax:  [phone]  Physical Therapy Treatment  Patient Details  Name: Melanie Hall MRN: [id number] Date of Birth: 05-Nov-1970 Referring Provider (PT): Alysia Penna MD   Encounter Date: 01/03/2021   PT End of Session - 01/03/21 0800     Visit Number 2    Number of Visits 12    Date for PT Re-Evaluation 01/31/21    Authorization Type Medicaid    Authorization Time Period check auth    PT Start Time 0803    PT Stop Time 0848    PT Time Calculation (min) 45 min    Equipment Utilized During Treatment Gait belt    Activity Tolerance Patient limited by fatigue;Patient tolerated treatment well    Behavior During Therapy [facility] for tasks assessed/performed             Past Medical History:  Diagnosis Date   Acute cystitis without hematuria    Acute midline thoracic back pain 2/45/8099   Acute systolic heart failure ([place]) 10/21/2019   Anemia    Arthritis    Cataracts, bilateral    surgery to remove   CATARACTS, BILATERAL 07/02/2007   Qualifier: Diagnosis of  By: Isla Pence     Closed fracture of left femur ([place]) 10/28/2018   Closed fracture of right ankle 11/06/2017   Diabetes mellitus    type 2   Emphysematous cystitis 08/26/2018   Encounter for gynecological examination with Papanicolaou smear of cervix 01/14/2020   Encounter for screening fecal occult blood testing 01/14/2020   Encounter for screening for malignant neoplasm of cervix 12/29/2019   Encounter for screening mammogram for malignant neoplasm of breast 12/29/2019   ESRD on hemodialysis ([place])    MWF - in [place]   GERD (gastroesophageal reflux disease)    Hyperlipidemia    Hypertension    Hypokalemia 08/26/2018   IRREGULAR MENSES 09/14/2009   Qualifier: Diagnosis of  By: Hassell Done FNP, Nykedtra     Loose stools 11/12/2019   Normocytic anemia 08/26/2018   PARONYCHIA, RIGHT GREAT TOE 07/30/2008    Qualifier: Diagnosis of  By: Hassell Done FNP, Nykedtra     Pressure ulcer 09/10/2019   Right arm weakness 08/08/2019   Sprain of left ankle    STEMI (ST elevation myocardial infarction) ([place]) 10/18/2019   STEMI involving right coronary artery ([place]) 10/18/2019   Stroke ([place]) 10/2019   SVD (spontaneous vaginal delivery)    x 4   Vaginosis 08/26/2018   Weakness 09/10/2019   Weakness of both lower extremities     Past Surgical History:  Procedure Laterality Date   A/V FISTULAGRAM Left 01/12/2020   Procedure: A/V FISTULAGRAM;  Surgeon: Serafina Mitchell, MD;  Location: [facility];  Service: Cardiovascular;  Laterality: Left;   A/V FISTULAGRAM N/A 03/17/2020   Procedure: A/V FISTULAGRAM - Left Arm;  Surgeon: Marty Heck, MD;  Location: [facility];  Service: Cardiovascular;  Laterality: N/A;   A/V FISTULAGRAM Left 06/23/2020   Procedure: A/V FISTULAGRAM;  Surgeon: Marty Heck, MD;  Location: [facility];  Service: Cardiovascular;  Laterality: Left;   ANKLE FUSION Left 06/01/2020   Procedure: LEFT ANKLE FUSION;  Surgeon: Newt Minion, MD;  Location: [facility];  Service: Orthopedics;  Laterality: Left;   AV FISTULA PLACEMENT Left 08/17/2019   Procedure: LEFT BRACHIAL CEPHALIC ARTERIOVENOUS (AV) FISTULA;  Surgeon: Angelia Mould, MD;  [facility]: [facility];  Service: Vascular;  Laterality: Left;   CORONARY STENT INTERVENTION N/A 10/18/2019   Procedure: CORONARY STENT INTERVENTION;  Surgeon: Sherren Mocha, MD;  Location: [facility];  Service: Cardiovascular;  Laterality: N/A;   CORONARY/GRAFT ACUTE MI REVASCULARIZATION N/A 10/18/2019   Procedure: Coronary/Graft Acute MI Revascularization;  Surgeon: Sherren Mocha, MD;  Location: [facility];  Service: Cardiovascular;  Laterality: N/A;   EYE SURGERY Bilateral    cataracts removed   FEMUR IM NAIL Left 10/28/2018   Procedure: RETROGRADE FEMORAL NAILING;  Surgeon: Meredith Pel, MD;  Location: [facility];   Service: Orthopedics;  Laterality: Left;   IM NAILING FEMORAL SHAFT RETROGRADE Left 10/28/2018   INTRAVASCULAR ULTRASOUND/IVUS N/A 10/18/2019   Procedure: Intravascular Ultrasound/IVUS;  Surgeon: Sherren Mocha, MD;  Location: [facility];  Service: Cardiovascular;  Laterality: N/A;   IR FLUORO GUIDE CV LINE RIGHT  08/11/2019   IR THORACENTESIS ASP PLEURAL SPACE W/IMG GUIDE  11/12/2019   IR US GUIDE VASC ACCESS RIGHT  08/11/2019   KNEE SURGERY Left    LEFT HEART CATH AND CORONARY ANGIOGRAPHY N/A 10/18/2019   Procedure: LEFT HEART CATH AND CORONARY ANGIOGRAPHY;  Surgeon: Sherren Mocha, MD;  Location: [facility];  Service: Cardiovascular;  Laterality: N/A;   PERIPHERAL VASCULAR BALLOON ANGIOPLASTY Left 01/12/2020   Procedure: PERIPHERAL VASCULAR BALLOON ANGIOPLASTY;  Surgeon: Serafina Mitchell, MD;  Location: [facility];  Service: Cardiovascular;  Laterality: Left;  AVF   PERIPHERAL VASCULAR BALLOON ANGIOPLASTY Left 03/17/2020   Procedure: PERIPHERAL VASCULAR BALLOON ANGIOPLASTY;  Surgeon: Marty Heck, MD;  Location: [facility];  Service: Cardiovascular;  Laterality: Left;  AVF   PERIPHERAL VASCULAR BALLOON ANGIOPLASTY Left 05/05/2020   Procedure: PERIPHERAL VASCULAR BALLOON ANGIOPLASTY;  Surgeon: Marty Heck, MD;  Location: [facility];  Service: Cardiovascular;  Laterality: Left;  arm fistula   RADIOLOGY WITH ANESTHESIA N/A 09/15/2019   Procedure: [facility] AND LUMBER LOWER BACK PAIN;  Surgeon: Radiologist, Medication, MD;  Location: [facility];  Service: Radiology;  Laterality: N/A;   TUBAL LIGATION      There were no vitals filed for this visit.   Subjective Assessment - 01/03/21 0812     Subjective Pt notes 2/10 mid back pain today and reports "good days and bad days" and reports today is feeling fairly good    Limitations Standing;Walking;House hold activities    How long can you stand comfortably? 3 minutes    How long can you walk comfortably?  about 10 minutes    Patient Stated Goals Get rid of wallker    Currently in Pain? Yes    Pain Score 2     Pain Location Back    Pain Orientation Mid    Pain Descriptors / Indicators Aching    Pain Type Acute pain    Pain Onset More than a month ago                [facility] PT Assessment - 01/03/21 0001       Assessment   Medical Diagnosis Mid back pain    Referring Provider (PT) Alysia Penna MD    Onset Date/Surgical Date 10/17/20    Prior Therapy Yes                           [facility]/Exercise - 01/03/21 0001       Knee/Hip Exercises: Aerobic   Nustep  level 2 x 6 min for dynamic warm-up and to improve trunk rotation      Knee/Hip Exercises: Standing   Other Standing Knee Exercises sidestepping x 2 min      Knee/Hip Exercises: Seated   Long Arc Quad Strengthening;Both;2 sets;10 reps    Long Arc Quad Weight 3 lbs.    Ball Squeeze 2x10    Other Seated Knee/Hip Exercises heel/toe raise 2x10    Marching Strengthening;Both;2 sets;10 reps    Marching Limitations 3 lbs      Shoulder Exercises: Seated   Horizontal ABduction Strengthening;Both;20 reps;Theraband    Theraband Level (Shoulder Horizontal ABduction) Level 2 (Red)    External Rotation Strengthening;Both;20 reps;Theraband    Theraband Level (Shoulder External Rotation) Level 2 (Red)    Other Seated Exercises thoracic extension with towel roll                    PT Education - 01/03/21 0838     Education Details HEp additions    Person(s) Educated Patient    Methods Explanation;Handout    Comprehension Verbalized understanding;Returned demonstration              PT Short Term Goals - 12/20/20 1052       PT SHORT TERM GOAL #1   Title Patient will be independent with initial HEP and self-management strategies to improve functional outcomes    Time 3    Period Weeks    Status New    Target Date 01/10/21      PT SHORT TERM GOAL #2   Title Patient will  be able to ambulate at least 175 feet during 2MWT with LRAD to demonstrate improved ability to perform functional mobility and associated tasks.    Time 3    Period Weeks    Status New    Target Date 01/10/21               PT Long Term Goals - 12/20/20 1053       PT LONG TERM GOAL #1   Title Patient will be able to ambulate at least 250 feet during 2MWT with LRAD to demonstrate improved ability to perform functional mobility and associated tasks.    Time 6    Period Weeks    Status New    Target Date 01/31/21      PT LONG TERM GOAL #2   Title Patient will be able to perform stand x 5 in < 12 seconds to demonstrate improvement in functional mobility and reduced risk for falls.    Time 6    Period Weeks    Status New    Target Date 01/31/21      PT LONG TERM GOAL #3   Title Patient will report at least 65% overall improvement in subjective complaint to indicate improvement in ability to perform ADLs.    Time 6    Period Weeks    Status New    Target Date 01/31/21                   Plan - 01/03/21 0846     Clinical Impression Statement Right ankle demonstrates tendency for inversion and instability during loading response and presents with chronic edema around right ankle.  Pt may benefit from lymphedema mgmt and followed with applicable bracing for right ankle stability to improve safety with gait and transfers.  Tolerated ROM and strengthening exercises for t-spine without adverse effects. Continued PT sessions indicated to progress strength  and spinal mobility and balance to reduce risk for falls    Examination-Activity Limitations Transfers;Locomotion Level    Examination-Participation Restrictions Community Activity;Other    Stability/Clinical Decision Making Stable/Uncomplicated    Rehab Potential Fair    PT Frequency 2x / week    PT Duration 6 weeks    PT Treatment/Interventions ADLs/Self Care Home Management;Aquatic Therapy;Stair  training;Biofeedback;Ultrasound;Traction;Functional mobility training;Therapeutic activities;Neuromuscular re-education;Balance training;Scar mobilization;Passive range of motion;Visual/perceptual remediation/compensation;Dry needling;Manual techniques;Patient/family education;Manual lymph drainage;Energy conservation;Spinal Manipulations;Splinting;Taping;Joint Manipulations;Vasopneumatic Device;Compression bandaging;Orthotic Fit/Training;Therapeutic exercise;Fluidtherapy;Contrast Bath;Cryotherapy;Electrical Stimulation;Parrafin;Iontophoresis 4mg /ml Dexamethasone;DME Instruction;Gait training;Moist Heat    PT Next Visit Plan Review goals and HEP. Progress core and postural strength as tolerated. Add sit/ stands, heel raise, thoracic excursions    PT Home Exercise Plan Eval: seated march, heel/ toe raise, scapular retraction    Consulted and Agree with Plan of Care Patient             Patient will benefit from skilled therapeutic intervention in order to improve the following deficits and impairments:  Abnormal gait, Decreased endurance, Decreased activity tolerance, Decreased balance, Decreased mobility, Difficulty walking, Decreased range of motion, Pain, Decreased strength  Visit Diagnosis: Pain in thoracic spine  Other abnormalities of gait and mobility  Difficulty in walking, not elsewhere classified  Repeated falls  Chronic midline low back pain without sciatica  Muscle weakness (generalized)     Problem List Patient Active Problem List   Diagnosis Date Noted   Pain and tenderness 11/10/2020   Non-intractable vomiting 06/16/2020   Encounter for support and coordination of transition of care 06/16/2020   S/P ankle fusion 06/01/2020   Pilon fracture of left tibia, closed, initial encounter    Displaced trimalleolar fracture of left lower leg, initial encounter for closed fracture    Abnormality of gait and mobility 05/26/2020   Migraine 05/26/2020   Diabetic retinopathy  ([place]) 05/04/2020   Chronic diarrhea of unknown origin 01/14/2020   Type 2 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, with long-term current use of insulin ([place]) 12/29/2019   Hypothyroidism 12/29/2019   Weakness of both lower extremities 11/12/2019   Debility 09/10/2019   ESRD (end stage renal disease) on dialysis ([place]) 09/10/2019   Depression 09/10/2019   Incontinence in female 09/10/2019   Anemia in chronic kidney disease 08/19/2019   MGUS (monoclonal gammopathy of unknown significance) 07/23/2019   GERD (gastroesophageal reflux disease) 02/28/2019   Essential hypertension 10/28/2018   Irregular menses 09/14/2009   Hypercholesterolemia 11/19/2008   Type 2 diabetes with nephropathy ([place]) 07/02/2007   8:51 AM, 01/03/21 M. Sherlyn Lees, PT, DPT Physical Therapist- [place] Office Number: [phone]   [facility] [address] [place], [place], [address] Phone: [phone]   Fax:  [phone]  Name: JILLIAM BELLMORE MRN: [id number] Date of Birth: Nov 07, 1970

## 2021-01-05 ENCOUNTER — Other Ambulatory Visit: Payer: Self-pay

## 2021-01-05 ENCOUNTER — Ambulatory Visit (HOSPITAL_COMMUNITY): Payer: Medicaid Other | Admitting: Physical Therapy

## 2021-01-05 ENCOUNTER — Telehealth (HOSPITAL_COMMUNITY): Payer: Self-pay | Admitting: Physical Therapy

## 2021-01-05 DIAGNOSIS — R2689 Other abnormalities of gait and mobility: Secondary | ICD-10-CM

## 2021-01-05 DIAGNOSIS — R296 Repeated falls: Secondary | ICD-10-CM

## 2021-01-05 DIAGNOSIS — R262 Difficulty in walking, not elsewhere classified: Secondary | ICD-10-CM

## 2021-01-05 DIAGNOSIS — M546 Pain in thoracic spine: Secondary | ICD-10-CM

## 2021-01-05 NOTE — Telephone Encounter (Signed)
Called daughter [name]) per patient request and left message regarding need for appointment to be made for Ms Pesnell at [facility] [place].  According to pt, [name] has the order at her house that was written on 4/19.  Expressed that this needed to be done asap as patient has very unstable ankle.  [facility] was also left on daughters voicemail [phone]) as well as clinic number to call if she has any questions.  Teena Irani, PTA/CLT [phone]

## 2021-01-05 NOTE — Therapy (Addendum)
[place], [place], [address] Phone: [phone]   Fax:  [phone]  Physical Therapy Treatment  Patient Details  Name: Melanie Hall MRN: [id number] Date of Birth: 1970-11-22 Referring Provider (PT): Alysia Penna MD   Encounter Date: 01/05/2021   PT End of Session - 01/05/21 0941     Visit Number 3    Number of Visits 12    Date for PT Re-Evaluation 01/31/21    Authorization Type Medicaid    Authorization Time Period 3 units 6/15-6/28;  check approval requested 10 more visits 6/30    Authorization - Visit Number 2    Authorization - Number of Visits 3    PT Start Time 0825    PT Stop Time 0915    PT Time Calculation (min) 50 min    Equipment Utilized During Treatment Gait belt    Activity Tolerance Patient limited by fatigue;Patient tolerated treatment well    Behavior During Therapy [facility] for tasks assessed/performed             Past Medical History:  Diagnosis Date   Acute cystitis without hematuria    Acute midline thoracic back pain 9/38/1829   Acute systolic heart failure ([place]) 10/21/2019   Anemia    Arthritis    Cataracts, bilateral    surgery to remove   CATARACTS, BILATERAL 07/02/2007   Qualifier: Diagnosis of  By: Isla Pence     Closed fracture of left femur ([place]) 10/28/2018   Closed fracture of right ankle 11/06/2017   Diabetes mellitus    type 2   Emphysematous cystitis 08/26/2018   Encounter for gynecological examination with Papanicolaou smear of cervix 01/14/2020   Encounter for screening fecal occult blood testing 01/14/2020   Encounter for screening for malignant neoplasm of cervix 12/29/2019   Encounter for screening mammogram for malignant neoplasm of breast 12/29/2019   ESRD on hemodialysis ([place])    MWF - in [place]   GERD (gastroesophageal reflux disease)    Hyperlipidemia    Hypertension    Hypokalemia 08/26/2018   IRREGULAR MENSES 09/14/2009   Qualifier: Diagnosis of   By: Hassell Done FNP, [place]     Loose stools 11/12/2019   Normocytic anemia 08/26/2018   PARONYCHIA, RIGHT GREAT TOE 07/30/2008   Qualifier: Diagnosis of  By: Hassell Done FNP, Nykedtra     Pressure ulcer 09/10/2019   Right arm weakness 08/08/2019   Sprain of left ankle    STEMI (ST elevation myocardial infarction) ([place]) 10/18/2019   STEMI involving right coronary artery ([place]) 10/18/2019   Stroke ([place]) 10/2019   SVD (spontaneous vaginal delivery)    x 4   Vaginosis 08/26/2018   Weakness 09/10/2019   Weakness of both lower extremities     Past Surgical History:  Procedure Laterality Date   A/V FISTULAGRAM Left 01/12/2020   Procedure: A/V FISTULAGRAM;  Surgeon: Serafina Mitchell, MD;  Location: [facility];  Service: Cardiovascular;  Laterality: Left;   A/V FISTULAGRAM N/A 03/17/2020   Procedure: A/V FISTULAGRAM - Left Arm;  Surgeon: Marty Heck, MD;  Location: [facility];  Service: Cardiovascular;  Laterality: N/A;   A/V FISTULAGRAM Left 06/23/2020   Procedure: A/V FISTULAGRAM;  Surgeon: Marty Heck, MD;  Location: [facility];  Service: Cardiovascular;  Laterality: Left;   ANKLE FUSION Left 06/01/2020   Procedure: LEFT ANKLE FUSION;  Surgeon: Newt Minion, MD;  Location: [facility];  Service: Orthopedics;  Laterality:  Left;   AV FISTULA PLACEMENT Left 08/17/2019   Procedure: LEFT BRACHIAL CEPHALIC ARTERIOVENOUS (AV) FISTULA;  Surgeon: Angelia Mould, MD;  Location: [facility];  Service: Vascular;  Laterality: Left;   CORONARY STENT INTERVENTION N/A 10/18/2019   Procedure: CORONARY STENT INTERVENTION;  Surgeon: Sherren Mocha, MD;  Location: [facility];  Service: Cardiovascular;  Laterality: N/A;   CORONARY/GRAFT ACUTE MI REVASCULARIZATION N/A 10/18/2019   Procedure: Coronary/Graft Acute MI Revascularization;  Surgeon: Sherren Mocha, MD;  Location: [facility];  Service: Cardiovascular;  Laterality: N/A;   EYE SURGERY Bilateral    cataracts removed    FEMUR IM NAIL Left 10/28/2018   Procedure: RETROGRADE FEMORAL NAILING;  Surgeon: Meredith Pel, MD;  Location: [facility];  Service: Orthopedics;  Laterality: Left;   IM NAILING FEMORAL SHAFT RETROGRADE Left 10/28/2018   INTRAVASCULAR ULTRASOUND/IVUS N/A 10/18/2019   Procedure: Intravascular Ultrasound/IVUS;  Surgeon: Sherren Mocha, MD;  Location: [facility];  Service: Cardiovascular;  Laterality: N/A;   IR FLUORO GUIDE CV LINE RIGHT  08/11/2019   IR THORACENTESIS ASP PLEURAL SPACE W/IMG GUIDE  11/12/2019   IR US GUIDE VASC ACCESS RIGHT  08/11/2019   KNEE SURGERY Left    LEFT HEART CATH AND CORONARY ANGIOGRAPHY N/A 10/18/2019   Procedure: LEFT HEART CATH AND CORONARY ANGIOGRAPHY;  Surgeon: Sherren Mocha, MD;  Location: [facility];  Service: Cardiovascular;  Laterality: N/A;   PERIPHERAL VASCULAR BALLOON ANGIOPLASTY Left 01/12/2020   Procedure: PERIPHERAL VASCULAR BALLOON ANGIOPLASTY;  Surgeon: Serafina Mitchell, MD;  Location: [facility];  Service: Cardiovascular;  Laterality: Left;  AVF   PERIPHERAL VASCULAR BALLOON ANGIOPLASTY Left 03/17/2020   Procedure: PERIPHERAL VASCULAR BALLOON ANGIOPLASTY;  Surgeon: Marty Heck, MD;  Location: [facility];  Service: Cardiovascular;  Laterality: Left;  AVF   PERIPHERAL VASCULAR BALLOON ANGIOPLASTY Left 05/05/2020   Procedure: PERIPHERAL VASCULAR BALLOON ANGIOPLASTY;  Surgeon: Marty Heck, MD;  Location: [facility];  Service: Cardiovascular;  Laterality: Left;  arm fistula   RADIOLOGY WITH ANESTHESIA N/A 09/15/2019   Procedure: [facility] AND LUMBER LOWER BACK PAIN;  Surgeon: Radiologist, Medication, MD;  Location: [facility];  Service: Radiology;  Laterality: N/A;   TUBAL LIGATION      There were no vitals filed for this visit.   Subjective Assessment - 01/05/21 1058     Subjective pt reports 10/10 LBP.  lt Ankle is less swollen today but continues to walk on the ouside of her ankle due to instability.                 [facility] PT Assessment - 01/05/21 0001       Assessment   Medical Diagnosis Mid back pain    Referring Provider (PT) Alysia Penna MD    Onset Date/Surgical Date 10/17/20      AROM   Right Ankle Dorsiflexion -25    Right Ankle Plantar Flexion 45    Right Ankle Inversion 25    Right Ankle Eversion 20    Left Ankle Dorsiflexion -10    Left Ankle Plantar Flexion 45    Left Ankle Inversion 15    Left Ankle Eversion 10      Special Tests    Special Tests Swelling Tests    Other special tests at malleoli Rt:23cm, Lt: 29cm; figure 8 Lt around heel 60cm  [facility] PT Treatment/Exercise - 01/05/21 0001       Knee/Hip Exercises: Stretches   Gastroc Stretch Both;2 reps;30 seconds    Gastroc Stretch Limitations with towel longsitting      Knee/Hip Exercises: Standing   Heel Raises Both;15 reps    Other Standing Knee Exercises sidestepping 4RT      Knee/Hip Exercises: Seated   Other Seated Knee/Hip Exercises toeraise minimal movement 2X10      Knee/Hip Exercises: Supine   Bridges Both;2 sets;10 reps    Straight Leg Raises Both;2 sets;10 reps                    PT Education - 01/05/21 [phone]     Education Details explanation of how ankle mechanics/leg length could be contibuting to LBP; instablity of ankle could cause another fall.  Addition of gastroc stretch to HEP; Compression garment education and instructions to order.  Explanation of need for brace ordered at [facility]) Educated Patient    Methods Explanation    Comprehension Verbalized understanding              PT Short Term Goals - 01/05/21 0958       PT SHORT TERM GOAL #1   Title Patient will be independent with initial HEP and self-management strategies to improve functional outcomes    Time 3    Period Weeks    Status Achieved    Target Date 01/10/21      PT SHORT TERM GOAL #2   Title Patient will be able to ambulate at least  175 feet during 2MWT with LRAD to demonstrate improved ability to perform functional mobility and associated tasks.    Time 3    Period Weeks    Status On-going    Target Date 01/10/21               PT Long Term Goals - 01/05/21 0959       PT LONG TERM GOAL #1   Title Patient will be able to ambulate at least 250 feet during 2MWT with LRAD to demonstrate improved ability to perform functional mobility and associated tasks.    Time 6    Period Weeks    Status On-going      PT LONG TERM GOAL #2   Title Patient will be able to perform stand x 5 in < 12 seconds to demonstrate improvement in functional mobility and reduced risk for falls.    Time 6    Period Weeks    Status On-going      PT LONG TERM GOAL #3   Title Patient will report at least 65% overall improvement in subjective complaint to indicate improvement in ability to perform ADLs.    Time 6    Period Weeks    Status On-going                   Plan - 01/05/21 1001     Clinical Impression Statement Discussed symptoms and reviewed notes from MD regarding Lt ankle.  Appears an orthotic and deep depth shoes were ordered 4/19 for [facility] in [place].  Pt states her daughter has the order and she has not got this scheduled yet.  Pt also had compression garments but states another friend threw these away.   Extreme glut weakness demonstrated by minimal hip rise with bridging.  Also noted with bil ankle contractures lacking 25 degrees Rt and 10 degrees Lt.  Instructed with  seated towel gastroc stretch to help improve this.  Lt ankle measured for edema with 6cm difference to Rt.  Pt given measurements and instructed to call [facility] in [place] to order compression garments.  Therapist to follow up with daughter and get appt made at [facility].    Examination-Activity Limitations Transfers;Locomotion Level    Examination-Participation Restrictions Community Activity;Other    Stability/Clinical Decision Making  Stable/Uncomplicated    Rehab Potential Fair    PT Frequency 2x / week    PT Duration 6 weeks    PT Treatment/Interventions ADLs/Self Care Home Management;Aquatic Therapy;Stair training;Biofeedback;Ultrasound;Traction;Functional mobility training;Therapeutic activities;Neuromuscular re-education;Balance training;Scar mobilization;Passive range of motion;Visual/perceptual remediation/compensation;Dry needling;Manual techniques;Patient/family education;Manual lymph drainage;Energy conservation;Spinal Manipulations;Splinting;Taping;Joint Manipulations;Vasopneumatic Device;Compression bandaging;Orthotic Fit/Training;Therapeutic exercise;Fluidtherapy;Contrast Bath;Cryotherapy;Electrical Stimulation;Parrafin;Iontophoresis 4mg /ml Dexamethasone;DME Instruction;Gait training;Moist Heat    PT Next Visit Plan Progress core and postural strength as tolerated. Add sit/ stands and core strengthening activities in supine.  F/U on orthotic appt and if pt has ordered compression stockings. Check medicaid approval    PT Home Exercise Plan Eval: seated march, heel/ toe raise, scapular retraction 6/30:  towel stretch for gastroc    Consulted and Agree with Plan of Care Patient             Patient will benefit from skilled therapeutic intervention in order to improve the following deficits and impairments:  Abnormal gait, Decreased endurance, Decreased activity tolerance, Decreased balance, Decreased mobility, Difficulty walking, Decreased range of motion, Pain, Decreased strength  Visit Diagnosis: Pain in thoracic spine  Other abnormalities of gait and mobility  Difficulty in walking, not elsewhere classified  Repeated falls     Problem List Patient Active Problem List   Diagnosis Date Noted   Pain and tenderness 11/10/2020   Non-intractable vomiting 06/16/2020   Encounter for support and coordination of transition of care 06/16/2020   S/P ankle fusion 06/01/2020   Pilon fracture of left tibia,  closed, initial encounter    Displaced trimalleolar fracture of left lower leg, initial encounter for closed fracture    Abnormality of gait and mobility 05/26/2020   Migraine 05/26/2020   Diabetic retinopathy ([place]) 05/04/2020   Chronic diarrhea of unknown origin 01/14/2020   Type 2 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, with long-term current use of insulin ([place]) 12/29/2019   Hypothyroidism 12/29/2019   Weakness of both lower extremities 11/12/2019   Debility 09/10/2019   ESRD (end stage renal disease) on dialysis ([place]) 09/10/2019   Depression 09/10/2019   Incontinence in female 09/10/2019   Anemia in chronic kidney disease 08/19/2019   MGUS (monoclonal gammopathy of unknown significance) 07/23/2019   GERD (gastroesophageal reflux disease) 02/28/2019   Essential hypertension 10/28/2018   Irregular menses 09/14/2009   Hypercholesterolemia 11/19/2008   Type 2 diabetes with nephropathy ([place]) 07/02/2007   Teena Irani, PTA/CLT [phone]  Teena Irani 01/05/2021, 12:57 PM  [facility] [address] [place], [place], [address] Phone: [phone]   Fax:  [phone]  Name: Melanie Hall MRN: [id number] Date of Birth: 04/11/1971

## 2021-01-10 ENCOUNTER — Other Ambulatory Visit: Payer: Self-pay

## 2021-01-10 ENCOUNTER — Encounter: Payer: Self-pay | Admitting: Physical Medicine & Rehabilitation

## 2021-01-10 ENCOUNTER — Encounter: Payer: Medicaid Other | Attending: Physical Medicine & Rehabilitation | Admitting: Physical Medicine & Rehabilitation

## 2021-01-10 VITALS — BP 152/78 | HR 87 | Temp 98.2°F | Ht 63.0 in | Wt 147.0 lb

## 2021-01-10 DIAGNOSIS — G894 Chronic pain syndrome: Secondary | ICD-10-CM | POA: Insufficient documentation

## 2021-01-10 DIAGNOSIS — M25561 Pain in right knee: Secondary | ICD-10-CM | POA: Insufficient documentation

## 2021-01-10 DIAGNOSIS — Z79891 Long term (current) use of opiate analgesic: Secondary | ICD-10-CM | POA: Diagnosis present

## 2021-01-10 DIAGNOSIS — R2231 Localized swelling, mass and lump, right upper limb: Secondary | ICD-10-CM | POA: Insufficient documentation

## 2021-01-10 DIAGNOSIS — Z5181 Encounter for therapeutic drug level monitoring: Secondary | ICD-10-CM | POA: Insufficient documentation

## 2021-01-10 MED ORDER — METHOCARBAMOL 500 MG PO TABS: 500.0000 mg | ORAL_TABLET | Freq: Three times a day (TID) | ORAL | 1 refills | Status: AC | PRN

## 2021-01-10 MED ORDER — TRAMADOL HCL 50 MG PO TABS: 50.0000 mg | ORAL_TABLET | Freq: Every day | ORAL | 5 refills | Status: DC | PRN

## 2021-01-10 NOTE — Patient Instructions (Signed)
Please try Voltaren gel to the right knee 4 times a day.  We will put any concerns about x-rays on my chart.
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
## 2021-01-12 ENCOUNTER — Other Ambulatory Visit: Payer: Self-pay

## 2021-01-12 ENCOUNTER — Ambulatory Visit (HOSPITAL_COMMUNITY): Payer: Medicaid Other | Attending: Orthopaedic Surgery | Admitting: Physical Therapy

## 2021-01-12 DIAGNOSIS — M546 Pain in thoracic spine: Secondary | ICD-10-CM | POA: Diagnosis present

## 2021-01-12 DIAGNOSIS — R296 Repeated falls: Secondary | ICD-10-CM | POA: Insufficient documentation

## 2021-01-12 DIAGNOSIS — R2689 Other abnormalities of gait and mobility: Secondary | ICD-10-CM | POA: Insufficient documentation

## 2021-01-12 DIAGNOSIS — R262 Difficulty in walking, not elsewhere classified: Secondary | ICD-10-CM

## 2021-01-12 NOTE — Therapy (Signed)
[place], [place], [address] Phone: [phone]   Fax:  [phone]  Physical Therapy Treatment  Patient Details  Name: Melanie Hall MRN: [id number] Date of Birth: 1971-03-29 Referring Provider (PT): Alysia Penna MD   Encounter Date: 01/12/2021   PT End of Session - 01/12/21 1316     Visit Number 4    Number of Visits 12    Date for PT Re-Evaluation 01/31/21    Authorization Type Medicaid    Authorization Time Period 10 visits 01/09/21-02/12/21    Authorization - Visit Number 1    Authorization - Number of Visits 10    PT Start Time 1324    PT Stop Time 4010    PT Time Calculation (min) 40 min    Equipment Utilized During Treatment --    Activity Tolerance Patient limited by fatigue;Patient tolerated treatment well    Behavior During Therapy [facility] for tasks assessed/performed             Past Medical History:  Diagnosis Date   Acute cystitis without hematuria    Acute midline thoracic back pain 2/72/5366   Acute systolic heart failure ([place]) 10/21/2019   Anemia    Arthritis    Cataracts, bilateral    surgery to remove   CATARACTS, BILATERAL 07/02/2007   Qualifier: Diagnosis of  By: Isla Pence     Closed fracture of left femur ([place]) 10/28/2018   Closed fracture of right ankle 11/06/2017   Diabetes mellitus    type 2   Emphysematous cystitis 08/26/2018   Encounter for gynecological examination with Papanicolaou smear of cervix 01/14/2020   Encounter for screening fecal occult blood testing 01/14/2020   Encounter for screening for malignant neoplasm of cervix 12/29/2019   Encounter for screening mammogram for malignant neoplasm of breast 12/29/2019   ESRD on hemodialysis ([place])    MWF - in [place]   GERD (gastroesophageal reflux disease)    Hyperlipidemia    Hypertension    Hypokalemia 08/26/2018   IRREGULAR MENSES 09/14/2009   Qualifier: Diagnosis of  By: Hassell Done FNP, Nykedtra     Loose stools  11/12/2019   Normocytic anemia 08/26/2018   PARONYCHIA, RIGHT GREAT TOE 07/30/2008   Qualifier: Diagnosis of  By: Hassell Done FNP, Nykedtra     Pressure ulcer 09/10/2019   Right arm weakness 08/08/2019   Sprain of left ankle    STEMI (ST elevation myocardial infarction) ([place]) 10/18/2019   STEMI involving right coronary artery ([place]) 10/18/2019   Stroke ([place]) 10/2019   SVD (spontaneous vaginal delivery)    x 4   Vaginosis 08/26/2018   Weakness 09/10/2019   Weakness of both lower extremities     Past Surgical History:  Procedure Laterality Date   A/V FISTULAGRAM Left 01/12/2020   Procedure: A/V FISTULAGRAM;  Surgeon: Serafina Mitchell, MD;  Location: [place] [facility];  Service: Cardiovascular;  Laterality: Left;   A/V FISTULAGRAM N/A 03/17/2020   Procedure: A/V FISTULAGRAM - Left Arm;  Surgeon: Marty Heck, MD;  Location: [place] [facility];  Service: Cardiovascular;  Laterality: N/A;   A/V FISTULAGRAM Left 06/23/2020   Procedure: A/V FISTULAGRAM;  Surgeon: Marty Heck, MD;  Location: [place] [facility];  Service: Cardiovascular;  Laterality: Left;   ANKLE FUSION Left 06/01/2020   Procedure: LEFT ANKLE FUSION;  Surgeon: Newt Minion, MD;  Location: [place];  Service: Orthopedics;  Laterality: Left;   AV FISTULA PLACEMENT Left 08/17/2019  Procedure: LEFT BRACHIAL CEPHALIC ARTERIOVENOUS (AV) FISTULA;  Surgeon: Angelia Mould, MD;  Location: [place];  Service: Vascular;  Laterality: Left;   CORONARY STENT INTERVENTION N/A 10/18/2019   Procedure: CORONARY STENT INTERVENTION;  Surgeon: Sherren Mocha, MD;  Location: [place] [facility];  Service: Cardiovascular;  Laterality: N/A;   CORONARY/GRAFT ACUTE MI REVASCULARIZATION N/A 10/18/2019   Procedure: Coronary/Graft Acute MI Revascularization;  Surgeon: Sherren Mocha, MD;  Location: [place] [facility];  Service: Cardiovascular;  Laterality: N/A;   EYE SURGERY Bilateral    cataracts removed   FEMUR IM NAIL Left 10/28/2018   Procedure:  RETROGRADE FEMORAL NAILING;  Surgeon: Meredith Pel, MD;  Location: [facility];  Service: Orthopedics;  Laterality: Left;   IM NAILING FEMORAL SHAFT RETROGRADE Left 10/28/2018   INTRAVASCULAR ULTRASOUND/IVUS N/A 10/18/2019   Procedure: Intravascular Ultrasound/IVUS;  Surgeon: Sherren Mocha, MD;  Location: [place] [facility];  Service: Cardiovascular;  Laterality: N/A;   IR FLUORO GUIDE CV LINE RIGHT  08/11/2019   IR THORACENTESIS ASP PLEURAL SPACE W/IMG GUIDE  11/12/2019   IR US GUIDE VASC ACCESS RIGHT  08/11/2019   KNEE SURGERY Left    LEFT HEART CATH AND CORONARY ANGIOGRAPHY N/A 10/18/2019   Procedure: LEFT HEART CATH AND CORONARY ANGIOGRAPHY;  Surgeon: Sherren Mocha, MD;  Location: [place] [facility];  Service: Cardiovascular;  Laterality: N/A;   PERIPHERAL VASCULAR BALLOON ANGIOPLASTY Left 01/12/2020   Procedure: PERIPHERAL VASCULAR BALLOON ANGIOPLASTY;  Surgeon: Serafina Mitchell, MD;  Location: [place] [facility];  Service: Cardiovascular;  Laterality: Left;  AVF   PERIPHERAL VASCULAR BALLOON ANGIOPLASTY Left 03/17/2020   Procedure: PERIPHERAL VASCULAR BALLOON ANGIOPLASTY;  Surgeon: Marty Heck, MD;  Location: [place] [facility];  Service: Cardiovascular;  Laterality: Left;  AVF   PERIPHERAL VASCULAR BALLOON ANGIOPLASTY Left 05/05/2020   Procedure: PERIPHERAL VASCULAR BALLOON ANGIOPLASTY;  Surgeon: Marty Heck, MD;  Location: [place] [facility] [facility];  Service: Cardiovascular;  Laterality: Left;  arm fistula   RADIOLOGY WITH ANESTHESIA N/A 09/15/2019   Procedure: [facility] AND LUMBER LOWER BACK PAIN;  Surgeon: Radiologist, Medication, MD;  Location: [facility];  Service: Radiology;  Laterality: N/A;   TUBAL LIGATION      There were no vitals filed for this visit.   Subjective Assessment - 01/12/21 1317     Subjective Still working on findings order for ankle orthotic, if not will call and get a new one. She hit her leg on her truck the other day, went to pain specialist and had some xrays  ordered. Moving slow today.    Currently in Pain? Yes    Pain Score 4     Pain Location Back    Pain Orientation Posterior;Lower    Pain Descriptors / Indicators Aching    Pain Type Chronic pain    Pain Onset More than a month ago    Pain Frequency Intermittent                               [facility] Adult PT Treatment/Exercise - 01/12/21 0001       Knee/Hip Exercises: Seated   Other Seated Knee/Hip Exercises heel/toe raise 2x10    Sit to Sand 1 set;10 reps;with UE support   elevated on low mat     Shoulder Exercises: Supine   Other Supine Exercises scap retraction 10, chin tuck x10    Other Supine Exercises ab set 10 x 5", brige  2x10, bent knee march x10, SLR with ab set x 10 each                      PT Short Term Goals - 01/05/21 0958       PT SHORT TERM GOAL #1   Title Patient will be independent with initial HEP and self-management strategies to improve functional outcomes    Time 3    Period Weeks    Status Achieved    Target Date 01/10/21      PT SHORT TERM GOAL #2   Title Patient will be able to ambulate at least 175 feet during 2MWT with LRAD to demonstrate improved ability to perform functional mobility and associated tasks.    Time 3    Period Weeks    Status On-going    Target Date 01/10/21               PT Long Term Goals - 01/05/21 0959       PT LONG TERM GOAL #1   Title Patient will be able to ambulate at least 250 feet during 2MWT with LRAD to demonstrate improved ability to perform functional mobility and associated tasks.    Time 6    Period Weeks    Status On-going      PT LONG TERM GOAL #2   Title Patient will be able to perform stand x 5 in < 12 seconds to demonstrate improvement in functional mobility and reduced risk for falls.    Time 6    Period Weeks    Status On-going      PT LONG TERM GOAL #3   Title Patient will report at least 65% overall improvement in subjective complaint to indicate  improvement in ability to perform ADLs.    Time 6    Period Weeks    Status On-going                   Plan - 01/12/21 1411     Clinical Impression Statement Patient tolerated session well overall though notes some level of discomfort with most activity. Notes increased muscle fatigue with added exercise. Progressed core strengthening on mat. Reiterated importance of follow up for ankle orthotic. Patient verbalizes understanding. Will continue to progress as tolerated.    Examination-Activity Limitations Transfers;Locomotion Level    Examination-Participation Restrictions Community Activity;Other    Stability/Clinical Decision Making Stable/Uncomplicated    Rehab Potential Fair    PT Frequency 2x / week    PT Duration 6 weeks    PT Treatment/Interventions ADLs/Self Care Home Management;Aquatic Therapy;Stair training;Biofeedback;Ultrasound;Traction;Functional mobility training;Therapeutic activities;Neuromuscular re-education;Balance training;Scar mobilization;Passive range of motion;Visual/perceptual remediation/compensation;Dry needling;Manual techniques;Patient/family education;Manual lymph drainage;Energy conservation;Spinal Manipulations;Splinting;Taping;Joint Manipulations;Vasopneumatic Device;Compression bandaging;Orthotic Fit/Training;Therapeutic exercise;Fluidtherapy;Contrast Bath;Cryotherapy;Electrical Stimulation;Parrafin;Iontophoresis 4mg /ml Dexamethasone;DME Instruction;Gait training;Moist Heat    PT Next Visit Plan Progress core and postural strength as tolerated. Add sit/ stands and core strengthening activities in supine.  F/U on orthotic appt and if pt has ordered compression stockings. Check medicaid approval    PT Home Exercise Plan Eval: seated march, heel/ toe raise, scapular retraction 6/30:  towel stretch for gastroc 7/7/ bridge, ab set, ab march    Consulted and Agree with Plan of Care Patient             Patient will benefit from skilled therapeutic  intervention in order to improve the following deficits and impairments:  Abnormal gait, Decreased endurance, Decreased activity tolerance, Decreased balance, Decreased mobility, Difficulty walking, Decreased range of motion,  Pain, Decreased strength  Visit Diagnosis: Pain in thoracic spine  Other abnormalities of gait and mobility  Difficulty in walking, not elsewhere classified  Repeated falls     Problem List Patient Active Problem List   Diagnosis Date Noted   Pain and tenderness 11/10/2020   Non-intractable vomiting 06/16/2020   Encounter for support and coordination of transition of care 06/16/2020   S/P ankle fusion 06/01/2020   Pilon fracture of left tibia, closed, initial encounter    Displaced trimalleolar fracture of left lower leg, initial encounter for closed fracture    Abnormality of gait and mobility 05/26/2020   Migraine 05/26/2020   Diabetic retinopathy ([place]) 05/04/2020   Chronic diarrhea of unknown origin 01/14/2020   Type 2 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, with long-term current use of insulin ([place]) 12/29/2019   Hypothyroidism 12/29/2019   Weakness of both lower extremities 11/12/2019   Debility 09/10/2019   ESRD (end stage renal disease) on dialysis ([place]) 09/10/2019   Depression 09/10/2019   Incontinence in female 09/10/2019   Anemia in chronic kidney disease 08/19/2019   MGUS (monoclonal gammopathy of unknown significance) 07/23/2019   GERD (gastroesophageal reflux disease) 02/28/2019   Essential hypertension 10/28/2018   Irregular menses 09/14/2009   Hypercholesterolemia 11/19/2008   Type 2 diabetes with nephropathy ([place]) 07/02/2007   2:13 PM, 01/12/21 Josue Hector PT DPT  Physical Therapist with [facility]  [phone] [place], [place], [address] Phone: [phone]   Fax:  [phone]  Name: Melanie Hall MRN:  [id number] Date of Birth: 1970-11-01

## 2021-01-17 ENCOUNTER — Ambulatory Visit (HOSPITAL_COMMUNITY): Payer: Medicaid Other | Admitting: Physical Therapy

## 2021-01-17 ENCOUNTER — Other Ambulatory Visit: Payer: Self-pay

## 2021-01-17 ENCOUNTER — Encounter (HOSPITAL_COMMUNITY): Payer: Self-pay | Admitting: Physical Therapy

## 2021-01-17 DIAGNOSIS — M546 Pain in thoracic spine: Secondary | ICD-10-CM

## 2021-01-17 DIAGNOSIS — R262 Difficulty in walking, not elsewhere classified: Secondary | ICD-10-CM

## 2021-01-17 DIAGNOSIS — R2689 Other abnormalities of gait and mobility: Secondary | ICD-10-CM

## 2021-01-17 LAB — DRUG TOX MONITOR 1 W/CONF, ORAL FLD

## 2021-01-17 LAB — DRUG TOX ALC METAB W/CON, ORAL FLD: Alcohol Metabolite: NEGATIVE ng/mL (ref <25)

## 2021-01-17 NOTE — Therapy (Signed)
[name], [place], [address] Phone: [phone]   Fax:  [phone]  Physical Therapy Treatment  Patient Details  Name: Melanie Hall MRN: [id number] Date of Birth: 04-24-1971 Referring Provider (PT): Alysia Penna MD   Encounter Date: 01/17/2021   PT End of Session - 01/17/21 1305     Visit Number 5    Number of Visits 12    Date for PT Re-Evaluation 01/31/21    Authorization Type Medicaid    Authorization Time Period 10 visits 01/09/21-02/12/21    Authorization - Visit Number 2    Authorization - Number of Visits 10    PT Start Time 1300    PT Stop Time 1340    PT Time Calculation (min) 40 min    Activity Tolerance Patient limited by fatigue;Patient tolerated treatment well    Behavior During Therapy [facility] for tasks assessed/performed             Past Medical History:  Diagnosis Date   Acute cystitis without hematuria    Acute midline thoracic back pain 03/09/5175   Acute systolic heart failure ([place]) 10/21/2019   Anemia    Arthritis    Cataracts, bilateral    surgery to remove   CATARACTS, BILATERAL 07/02/2007   Qualifier: Diagnosis of  By: Isla Pence     Closed fracture of left femur ([place]) 10/28/2018   Closed fracture of right ankle 11/06/2017   Diabetes mellitus    type 2   Emphysematous cystitis 08/26/2018   Encounter for gynecological examination with Papanicolaou smear of cervix 01/14/2020   Encounter for screening fecal occult blood testing 01/14/2020   Encounter for screening for malignant neoplasm of cervix 12/29/2019   Encounter for screening mammogram for malignant neoplasm of breast 12/29/2019   ESRD on hemodialysis ([place])    MWF - in [place]   GERD (gastroesophageal reflux disease)    Hyperlipidemia    Hypertension    Hypokalemia 08/26/2018   IRREGULAR MENSES 09/14/2009   Qualifier: Diagnosis of  By: Hassell Done FNP, Nykedtra     Loose stools 11/12/2019   Normocytic anemia 08/26/2018    PARONYCHIA, RIGHT GREAT TOE 07/30/2008   Qualifier: Diagnosis of  By: Hassell Done FNP, Nykedtra     Pressure ulcer 09/10/2019   Right arm weakness 08/08/2019   Sprain of left ankle    STEMI (ST elevation myocardial infarction) ([place]) 10/18/2019   STEMI involving right coronary artery ([place]) 10/18/2019   Stroke ([place]) 10/2019   SVD (spontaneous vaginal delivery)    x 4   Vaginosis 08/26/2018   Weakness 09/10/2019   Weakness of both lower extremities     Past Surgical History:  Procedure Laterality Date   A/V FISTULAGRAM Left 01/12/2020   Procedure: A/V FISTULAGRAM;  Surgeon: Serafina Mitchell, MD;  Location: [place] [facility];  Service: Cardiovascular;  Laterality: Left;   A/V FISTULAGRAM N/A 03/17/2020   Procedure: A/V FISTULAGRAM - Left Arm;  Surgeon: Marty Heck, MD;  Location: [facility] [facility];  Service: Cardiovascular;  Laterality: N/A;   A/V FISTULAGRAM Left 06/23/2020   Procedure: A/V FISTULAGRAM;  Surgeon: Marty Heck, MD;  Location: [place] [facility];  Service: Cardiovascular;  Laterality: Left;   ANKLE FUSION Left 06/01/2020   Procedure: LEFT ANKLE FUSION;  Surgeon: Newt Minion, MD;  Location: [place];  Service: Orthopedics;  Laterality: Left;   AV FISTULA PLACEMENT Left 08/17/2019   Procedure: LEFT BRACHIAL CEPHALIC ARTERIOVENOUS (AV) FISTULA;  Surgeon: Angelia Mould, MD;  Location: [facility] [place];  Service: Vascular;  Laterality: Left;   CORONARY STENT INTERVENTION N/A 10/18/2019   Procedure: CORONARY STENT INTERVENTION;  Surgeon: Sherren Mocha, MD;  Location: [place] [facility];  Service: Cardiovascular;  Laterality: N/A;   CORONARY/GRAFT ACUTE MI REVASCULARIZATION N/A 10/18/2019   Procedure: Coronary/Graft Acute MI Revascularization;  Surgeon: Sherren Mocha, MD;  Location: [place] [facility];  Service: Cardiovascular;  Laterality: N/A;   EYE SURGERY Bilateral    cataracts removed   FEMUR IM NAIL Left 10/28/2018   Procedure: RETROGRADE FEMORAL NAILING;  Surgeon: Meredith Pel, MD;  Location: [facility];  Service: Orthopedics;  Laterality: Left;   IM NAILING FEMORAL SHAFT RETROGRADE Left 10/28/2018   INTRAVASCULAR ULTRASOUND/IVUS N/A 10/18/2019   Procedure: Intravascular Ultrasound/IVUS;  Surgeon: Sherren Mocha, MD;  Location: [facility] [facility];  Service: Cardiovascular;  Laterality: N/A;   IR FLUORO GUIDE CV LINE RIGHT  08/11/2019   IR THORACENTESIS ASP PLEURAL SPACE W/IMG GUIDE  11/12/2019   IR US GUIDE VASC ACCESS RIGHT  08/11/2019   KNEE SURGERY Left    LEFT HEART CATH AND CORONARY ANGIOGRAPHY N/A 10/18/2019   Procedure: LEFT HEART CATH AND CORONARY ANGIOGRAPHY;  Surgeon: Sherren Mocha, MD;  Location: [place] [facility];  Service: Cardiovascular;  Laterality: N/A;   PERIPHERAL VASCULAR BALLOON ANGIOPLASTY Left 01/12/2020   Procedure: PERIPHERAL VASCULAR BALLOON ANGIOPLASTY;  Surgeon: Serafina Mitchell, MD;  Location: [place] [facility];  Service: Cardiovascular;  Laterality: Left;  AVF   PERIPHERAL VASCULAR BALLOON ANGIOPLASTY Left 03/17/2020   Procedure: PERIPHERAL VASCULAR BALLOON ANGIOPLASTY;  Surgeon: Marty Heck, MD;  Location: [facility];  Service: Cardiovascular;  Laterality: Left;  AVF   PERIPHERAL VASCULAR BALLOON ANGIOPLASTY Left 05/05/2020   Procedure: PERIPHERAL VASCULAR BALLOON ANGIOPLASTY;  Surgeon: Marty Heck, MD;  Location: [place] [facility];  Service: Cardiovascular;  Laterality: Left;  arm fistula   RADIOLOGY WITH ANESTHESIA N/A 09/15/2019   Procedure: [facility] AND LUMBER LOWER BACK PAIN;  Surgeon: Radiologist, Medication, MD;  Location: [facility];  Service: Radiology;  Laterality: N/A;   TUBAL LIGATION      There were no vitals filed for this visit.   Subjective Assessment - 01/17/21 1304     Subjective RT leg is hurting today. Has not gotten orthotic. RT leg is tight today. Back is about a 4.    Currently in Pain? Yes    Pain Score 4     Pain Location Back    Pain Orientation Posterior;Lower    Pain Descriptors /  Indicators Aching    Pain Type Chronic pain    Pain Onset More than a month ago                               [facility] Adult PT Treatment/Exercise - 01/17/21 0001       Knee/Hip Exercises: Seated   Long Arc Quad Both;2 sets;10 reps    Sit to [employer] 2 sets;5 reps;with UE support   from elevated low mat     Knee/Hip Exercises: Supine   Quad Sets Right;1 set;15 reps    Bridges Both;2 sets;10 reps    Straight Leg Raises Both;2 sets;10 reps      Knee/Hip Exercises: Sidelying   Clams x 20 each      Shoulder Exercises: Supine   Other Supine Exercises scap retraction 10 x5", chin tuck 10x5"  PT Short Term Goals - 01/05/21 0958       PT SHORT TERM GOAL #1   Title Patient will be independent with initial HEP and self-management strategies to improve functional outcomes    Time 3    Period Weeks    Status Achieved    Target Date 01/10/21      PT SHORT TERM GOAL #2   Title Patient will be able to ambulate at least 175 feet during 2MWT with LRAD to demonstrate improved ability to perform functional mobility and associated tasks.    Time 3    Period Weeks    Status On-going    Target Date 01/10/21               PT Long Term Goals - 01/05/21 0959       PT LONG TERM GOAL #1   Title Patient will be able to ambulate at least 250 feet during 2MWT with LRAD to demonstrate improved ability to perform functional mobility and associated tasks.    Time 6    Period Weeks    Status On-going      PT LONG TERM GOAL #2   Title Patient will be able to perform stand x 5 in < 12 seconds to demonstrate improvement in functional mobility and reduced risk for falls.    Time 6    Period Weeks    Status On-going      PT LONG TERM GOAL #3   Title Patient will report at least 65% overall improvement in subjective complaint to indicate improvement in ability to perform ADLs.    Time 6    Period Weeks    Status On-going                    Plan - 01/17/21 1338     Clinical Impression Statement Patient showing slow steady strength improvements. Still limited by RLE pain. Able to progress to seated LAQ and sit to stands. Patient noting intermittent bouts of spontaneous nausea today. Required several rest breaks. Will continue to progress toward WB activity as tolerated. Another reminder about RT ankle orthotic.    Examination-Activity Limitations Transfers;Locomotion Level    Examination-Participation Restrictions Community Activity;Other    Stability/Clinical Decision Making Stable/Uncomplicated    Rehab Potential Fair    PT Frequency 2x / week    PT Duration 6 weeks    PT Treatment/Interventions ADLs/Self Care Home Management;Aquatic Therapy;Stair training;Biofeedback;Ultrasound;Traction;Functional mobility training;Therapeutic activities;Neuromuscular re-education;Balance training;Scar mobilization;Passive range of motion;Visual/perceptual remediation/compensation;Dry needling;Manual techniques;Patient/family education;Manual lymph drainage;Energy conservation;Spinal Manipulations;Splinting;Taping;Joint Manipulations;Vasopneumatic Device;Compression bandaging;Orthotic Fit/Training;Therapeutic exercise;Fluidtherapy;Contrast Bath;Cryotherapy;Electrical Stimulation;Parrafin;Iontophoresis 4mg /ml Dexamethasone;DME Instruction;Gait training;Moist Heat    PT Next Visit Plan Progress core and postural strength as tolerated. Add sit/ stands and core strengthening activities in supine.  F/U on orthotic appt and if pt has ordered compression stockings. Check medicaid approval    PT Home Exercise Plan Eval: seated march, heel/ toe raise, scapular retraction 6/30:  towel stretch for gastroc 7/7/ bridge, ab set, ab march    Consulted and Agree with Plan of Care Patient             Patient will benefit from skilled therapeutic intervention in order to improve the following deficits and impairments:  Abnormal gait, Decreased  endurance, Decreased activity tolerance, Decreased balance, Decreased mobility, Difficulty walking, Decreased range of motion, Pain, Decreased strength  Visit Diagnosis: Pain in thoracic spine  Other abnormalities of gait and mobility  Difficulty in walking, not elsewhere classified  Problem List Patient Active Problem List   Diagnosis Date Noted   Pain and tenderness 11/10/2020   Non-intractable vomiting 06/16/2020   Encounter for support and coordination of transition of care 06/16/2020   S/P ankle fusion 06/01/2020   Pilon fracture of left tibia, closed, initial encounter    Displaced trimalleolar fracture of left lower leg, initial encounter for closed fracture    Abnormality of gait and mobility 05/26/2020   Migraine 05/26/2020   Diabetic retinopathy ([place]) 05/04/2020   Chronic diarrhea of unknown origin 01/14/2020   Type 2 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, with long-term current use of insulin ([place]) 12/29/2019   Hypothyroidism 12/29/2019   Weakness of both lower extremities 11/12/2019   Debility 09/10/2019   ESRD (end stage renal disease) on dialysis ([place]) 09/10/2019   Depression 09/10/2019   Incontinence in female 09/10/2019   Anemia in chronic kidney disease 08/19/2019   MGUS (monoclonal gammopathy of unknown significance) 07/23/2019   GERD (gastroesophageal reflux disease) 02/28/2019   Essential hypertension 10/28/2018   Irregular menses 09/14/2009   Hypercholesterolemia 11/19/2008   Type 2 diabetes with nephropathy ([place]) 07/02/2007   1:39 PM, 01/17/21 Josue Hector PT DPT  Physical Therapist with [facility]  [phone] [place] [address] [place], [place], [address] Phone: [phone]   Fax:  [phone]  Name: MALIEA GRANDMAISON MRN: [id number] Date of Birth: 1970-12-18

## 2021-01-19 ENCOUNTER — Ambulatory Visit (HOSPITAL_COMMUNITY): Payer: Medicaid Other | Admitting: Physical Therapy

## 2021-01-19 ENCOUNTER — Other Ambulatory Visit: Payer: Self-pay

## 2021-01-19 ENCOUNTER — Encounter (HOSPITAL_COMMUNITY): Payer: Self-pay | Admitting: Physical Therapy

## 2021-01-19 ENCOUNTER — Telehealth: Payer: Self-pay | Admitting: Family

## 2021-01-19 DIAGNOSIS — M546 Pain in thoracic spine: Secondary | ICD-10-CM | POA: Diagnosis not present

## 2021-01-19 DIAGNOSIS — R2689 Other abnormalities of gait and mobility: Secondary | ICD-10-CM

## 2021-01-19 DIAGNOSIS — R262 Difficulty in walking, not elsewhere classified: Secondary | ICD-10-CM

## 2021-01-19 NOTE — Therapy (Signed)
[place], [place], [address] Phone: [phone]   Fax:  [phone]  Physical Therapy Treatment  Patient Details  Name: Melanie Hall MRN: [id number] Date of Birth: 07-29-1970 Referring Provider (PT): Alysia Penna MD   Encounter Date: 01/19/2021   PT End of Session - 01/19/21 1311     Visit Number 6    Number of Visits 12    Date for PT Re-Evaluation 01/31/21    Authorization Type Medicaid    Authorization Time Period 10 visits 01/09/21-02/12/21    Authorization - Visit Number 3    Authorization - Number of Visits 10    PT Start Time 1443    PT Stop Time 1343    PT Time Calculation (min) 38 min    Activity Tolerance Patient tolerated treatment well    Behavior During Therapy [facility] for tasks assessed/performed             Past Medical History:  Diagnosis Date   Acute cystitis without hematuria    Acute midline thoracic back pain 1/54/0086   Acute systolic heart failure ([place]) 10/21/2019   Anemia    Arthritis    Cataracts, bilateral    surgery to remove   CATARACTS, BILATERAL 07/02/2007   Qualifier: Diagnosis of  By: Isla Pence     Closed fracture of left femur ([place]) 10/28/2018   Closed fracture of right ankle 11/06/2017   Diabetes mellitus    type 2   Emphysematous cystitis 08/26/2018   Encounter for gynecological examination with Papanicolaou smear of cervix 01/14/2020   Encounter for screening fecal occult blood testing 01/14/2020   Encounter for screening for malignant neoplasm of cervix 12/29/2019   Encounter for screening mammogram for malignant neoplasm of breast 12/29/2019   ESRD on hemodialysis ([place])    MWF - in [place]   GERD (gastroesophageal reflux disease)    Hyperlipidemia    Hypertension    Hypokalemia 08/26/2018   IRREGULAR MENSES 09/14/2009   Qualifier: Diagnosis of  By: Hassell Done FNP, [place]     Loose stools 11/12/2019   Normocytic anemia 08/26/2018   PARONYCHIA, RIGHT GREAT TOE  07/30/2008   Qualifier: Diagnosis of  By: Hassell Done FNP, [place]     Pressure ulcer 09/10/2019   Right arm weakness 08/08/2019   Sprain of left ankle    STEMI (ST elevation myocardial infarction) ([place]) 10/18/2019   STEMI involving right coronary artery ([place]) 10/18/2019   Stroke ([place]) 10/2019   SVD (spontaneous vaginal delivery)    x 4   Vaginosis 08/26/2018   Weakness 09/10/2019   Weakness of both lower extremities     Past Surgical History:  Procedure Laterality Date   A/V FISTULAGRAM Left 01/12/2020   Procedure: A/V FISTULAGRAM;  Surgeon: Serafina Mitchell, MD;  Location: [place] [facility];  Service: Cardiovascular;  Laterality: Left;   A/V FISTULAGRAM N/A 03/17/2020   Procedure: A/V FISTULAGRAM - Left Arm;  Surgeon: Marty Heck, MD;  Location: [place] [facility];  Service: Cardiovascular;  Laterality: N/A;   A/V FISTULAGRAM Left 06/23/2020   Procedure: A/V FISTULAGRAM;  Surgeon: Marty Heck, MD;  Location: [place] [facility];  Service: Cardiovascular;  Laterality: Left;   ANKLE FUSION Left 06/01/2020   Procedure: LEFT ANKLE FUSION;  Surgeon: Newt Minion, MD;  Location: [place];  Service: Orthopedics;  Laterality: Left;   AV FISTULA PLACEMENT Left 08/17/2019   Procedure: LEFT BRACHIAL CEPHALIC ARTERIOVENOUS (AV) FISTULA;  Surgeon: Scot Dock,  Judeth Cornfield, MD;  Location: [facility] OR;  Service: Vascular;  Laterality: Left;   CORONARY STENT INTERVENTION N/A 10/18/2019   Procedure: CORONARY STENT INTERVENTION;  Surgeon: Sherren Mocha, MD;  Location: [place] [facility];  Service: Cardiovascular;  Laterality: N/A;   CORONARY/GRAFT ACUTE MI REVASCULARIZATION N/A 10/18/2019   Procedure: Coronary/Graft Acute MI Revascularization;  Surgeon: Sherren Mocha, MD;  Location: [place] [facility];  Service: Cardiovascular;  Laterality: N/A;   EYE SURGERY Bilateral    cataracts removed   FEMUR IM NAIL Left 10/28/2018   Procedure: RETROGRADE FEMORAL NAILING;  Surgeon: Meredith Pel, MD;  Location:  [place];  Service: Orthopedics;  Laterality: Left;   IM NAILING FEMORAL SHAFT RETROGRADE Left 10/28/2018   INTRAVASCULAR ULTRASOUND/IVUS N/A 10/18/2019   Procedure: Intravascular Ultrasound/IVUS;  Surgeon: Sherren Mocha, MD;  Location: [place] [facility];  Service: Cardiovascular;  Laterality: N/A;   IR FLUORO GUIDE CV LINE RIGHT  08/11/2019   IR THORACENTESIS ASP PLEURAL SPACE W/IMG GUIDE  11/12/2019   IR US GUIDE VASC ACCESS RIGHT  08/11/2019   KNEE SURGERY Left    LEFT HEART CATH AND CORONARY ANGIOGRAPHY N/A 10/18/2019   Procedure: LEFT HEART CATH AND CORONARY ANGIOGRAPHY;  Surgeon: Sherren Mocha, MD;  Location: [place] [facility];  Service: Cardiovascular;  Laterality: N/A;   PERIPHERAL VASCULAR BALLOON ANGIOPLASTY Left 01/12/2020   Procedure: PERIPHERAL VASCULAR BALLOON ANGIOPLASTY;  Surgeon: Serafina Mitchell, MD;  Location: [place] [facility];  Service: Cardiovascular;  Laterality: Left;  AVF   PERIPHERAL VASCULAR BALLOON ANGIOPLASTY Left 03/17/2020   Procedure: PERIPHERAL VASCULAR BALLOON ANGIOPLASTY;  Surgeon: Marty Heck, MD;  Location: [place] [facility];  Service: Cardiovascular;  Laterality: Left;  AVF   PERIPHERAL VASCULAR BALLOON ANGIOPLASTY Left 05/05/2020   Procedure: PERIPHERAL VASCULAR BALLOON ANGIOPLASTY;  Surgeon: Marty Heck, MD;  Location: [place] [facility];  Service: Cardiovascular;  Laterality: Left;  arm fistula   RADIOLOGY WITH ANESTHESIA N/A 09/15/2019   Procedure: [facility] AND LUMBER LOWER BACK PAIN;  Surgeon: Radiologist, Medication, MD;  Location: [place];  Service: Radiology;  Laterality: N/A;   TUBAL LIGATION      There were no vitals filed for this visit.   Subjective Assessment - 01/19/21 1310     Subjective Doing ok. Has intermittnet pain in center of checst and back, does exercise and it goes away.    Currently in Pain? Yes    Pain Score 3     Pain Location Back    Pain Orientation Posterior;Lower    Pain Descriptors / Indicators Aching    Pain  Type Chronic pain    Pain Onset More than a month ago    Pain Frequency Intermittent                               [facility] Adult PT Treatment/Exercise - 01/19/21 0001       Knee/Hip Exercises: Seated   Sit to Sand with UE support;1 set;10 reps      Knee/Hip Exercises: Supine   Bridges Both;2 sets;10 reps    Straight Leg Raises Both;2 sets;10 reps    Other Supine Knee/Hip Exercises ab set x15, ab march x15, scapular retraction 15 x 5"      Knee/Hip Exercises: Sidelying   Clams x 20 each                      PT Short  Term Goals - 01/05/21 0958       PT SHORT TERM GOAL #1   Title Patient will be independent with initial HEP and self-management strategies to improve functional outcomes    Time 3    Period Weeks    Status Achieved    Target Date 01/10/21      PT SHORT TERM GOAL #2   Title Patient will be able to ambulate at least 175 feet during 2MWT with LRAD to demonstrate improved ability to perform functional mobility and associated tasks.    Time 3    Period Weeks    Status On-going    Target Date 01/10/21               PT Long Term Goals - 01/05/21 0959       PT LONG TERM GOAL #1   Title Patient will be able to ambulate at least 250 feet during 2MWT with LRAD to demonstrate improved ability to perform functional mobility and associated tasks.    Time 6    Period Weeks    Status On-going      PT LONG TERM GOAL #2   Title Patient will be able to perform stand x 5 in < 12 seconds to demonstrate improvement in functional mobility and reduced risk for falls.    Time 6    Period Weeks    Status On-going      PT LONG TERM GOAL #3   Title Patient will report at least 65% overall improvement in subjective complaint to indicate improvement in ability to perform ADLs.    Time 6    Period Weeks    Status On-going                   Plan - 01/19/21 1339     Clinical Impression Statement Patient tolerated session well  today. Continued with LE and core strengthening on mat. Liming WB activity until RT ankle orthosis procured. Patient called in to have a new order sent. Added ab march and clamshell to HEP. Patient will continue to benefit from skilled therapy services to reduce deficits and improve functional ability.    Examination-Activity Limitations Transfers;Locomotion Level    Examination-Participation Restrictions Community Activity;Other    Stability/Clinical Decision Making Stable/Uncomplicated    Rehab Potential Fair    PT Frequency 2x / week    PT Duration 6 weeks    PT Treatment/Interventions ADLs/Self Care Home Management;Aquatic Therapy;Stair training;Biofeedback;Ultrasound;Traction;Functional mobility training;Therapeutic activities;Neuromuscular re-education;Balance training;Scar mobilization;Passive range of motion;Visual/perceptual remediation/compensation;Dry needling;Manual techniques;Patient/family education;Manual lymph drainage;Energy conservation;Spinal Manipulations;Splinting;Taping;Joint Manipulations;Vasopneumatic Device;Compression bandaging;Orthotic Fit/Training;Therapeutic exercise;Fluidtherapy;Contrast Bath;Cryotherapy;Electrical Stimulation;Parrafin;Iontophoresis 4mg /ml Dexamethasone;DME Instruction;Gait training;Moist Heat    PT Next Visit Plan Progress core and postural strength as tolerated. Add LTR.    PT Home Exercise Plan Eval: seated march, heel/ toe raise, scapular retraction 6/30:  towel stretch for gastroc 7/7/ bridge, ab set, ab march 7/14 ab march, clamshell    Consulted and Agree with Plan of Care Patient             Patient will benefit from skilled therapeutic intervention in order to improve the following deficits and impairments:  Abnormal gait, Decreased endurance, Decreased activity tolerance, Decreased balance, Decreased mobility, Difficulty walking, Decreased range of motion, Pain, Decreased strength  Visit Diagnosis: Pain in thoracic spine  Other  abnormalities of gait and mobility  Difficulty in walking, not elsewhere classified     Problem List Patient Active Problem List   Diagnosis Date Noted   Pain and  tenderness 11/10/2020   Non-intractable vomiting 06/16/2020   Encounter for support and coordination of transition of care 06/16/2020   S/P ankle fusion 06/01/2020   Pilon fracture of left tibia, closed, initial encounter    Displaced trimalleolar fracture of left lower leg, initial encounter for closed fracture    Abnormality of gait and mobility 05/26/2020   Migraine 05/26/2020   Diabetic retinopathy ([place]) 05/04/2020   Chronic diarrhea of unknown origin 01/14/2020   Type 2 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, with long-term current use of insulin ([place]) 12/29/2019   Hypothyroidism 12/29/2019   Weakness of both lower extremities 11/12/2019   Debility 09/10/2019   ESRD (end stage renal disease) on dialysis ([place]) 09/10/2019   Depression 09/10/2019   Incontinence in female 09/10/2019   Anemia in chronic kidney disease 08/19/2019   MGUS (monoclonal gammopathy of unknown significance) 07/23/2019   GERD (gastroesophageal reflux disease) 02/28/2019   Essential hypertension 10/28/2018   Irregular menses 09/14/2009   Hypercholesterolemia 11/19/2008   Type 2 diabetes with nephropathy ([place]) 07/02/2007   1:44 PM, 01/19/21 Josue Hector PT DPT  Physical Therapist with [facility]  [phone] [place] [address] [place], [place], [address] Phone: [phone]   Fax:  [phone]  Name: Melanie Hall MRN: [id number] Date of Birth: Jan 09, 1971

## 2021-01-19 NOTE — Patient Instructions (Signed)
Access Code: [email or web address] URL: [email or web address] Date: 01/19/2021 Prepared by: Josue Hector  Exercises Supine March - 2-3 x daily - 7 x weekly - 2 sets - 10 reps Clamshell - 2-3 x daily - 7 x weekly - 2 sets - 10 reps

## 2021-01-19 NOTE — Telephone Encounter (Signed)
Patient called. She as missed placed her RX for her shoe. Would like a new RX. Her call back number is [phone]

## 2021-01-20 NOTE — Telephone Encounter (Signed)
written

## 2021-01-24 ENCOUNTER — Other Ambulatory Visit: Payer: Self-pay

## 2021-01-24 ENCOUNTER — Ambulatory Visit (HOSPITAL_COMMUNITY): Payer: Medicaid Other | Admitting: Physical Therapy

## 2021-01-24 ENCOUNTER — Ambulatory Visit (HOSPITAL_COMMUNITY)
Admission: RE | Admit: 2021-01-24 | Discharge: 2021-01-24 | Disposition: A | Discharge status: Home / Self Care | Payer: Medicaid Other | Source: Ambulatory Visit | Attending: Physical Medicine & Rehabilitation | Admitting: Physical Medicine & Rehabilitation

## 2021-01-24 ENCOUNTER — Encounter (HOSPITAL_COMMUNITY): Payer: Self-pay | Admitting: Physical Therapy

## 2021-01-24 DIAGNOSIS — R2231 Localized swelling, mass and lump, right upper limb: Secondary | ICD-10-CM | POA: Insufficient documentation

## 2021-01-24 DIAGNOSIS — M25561 Pain in right knee: Secondary | ICD-10-CM | POA: Diagnosis present

## 2021-01-24 DIAGNOSIS — M549 Dorsalgia, unspecified: Secondary | ICD-10-CM | POA: Diagnosis present

## 2021-01-24 DIAGNOSIS — M546 Pain in thoracic spine: Secondary | ICD-10-CM | POA: Diagnosis not present

## 2021-01-24 DIAGNOSIS — R2689 Other abnormalities of gait and mobility: Secondary | ICD-10-CM

## 2021-01-24 DIAGNOSIS — R262 Difficulty in walking, not elsewhere classified: Secondary | ICD-10-CM
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
## 2021-01-24 IMAGING — DX DG KNEE 1-2V*R*
2 series · 2 of 2 positions shown · non-contrast
Comparison: None.

CLINICAL DATA: Right knee pain.  Hit knee on car door.

EXAM:
RIGHT KNEE - 1-2 VIEW

[knee ap]
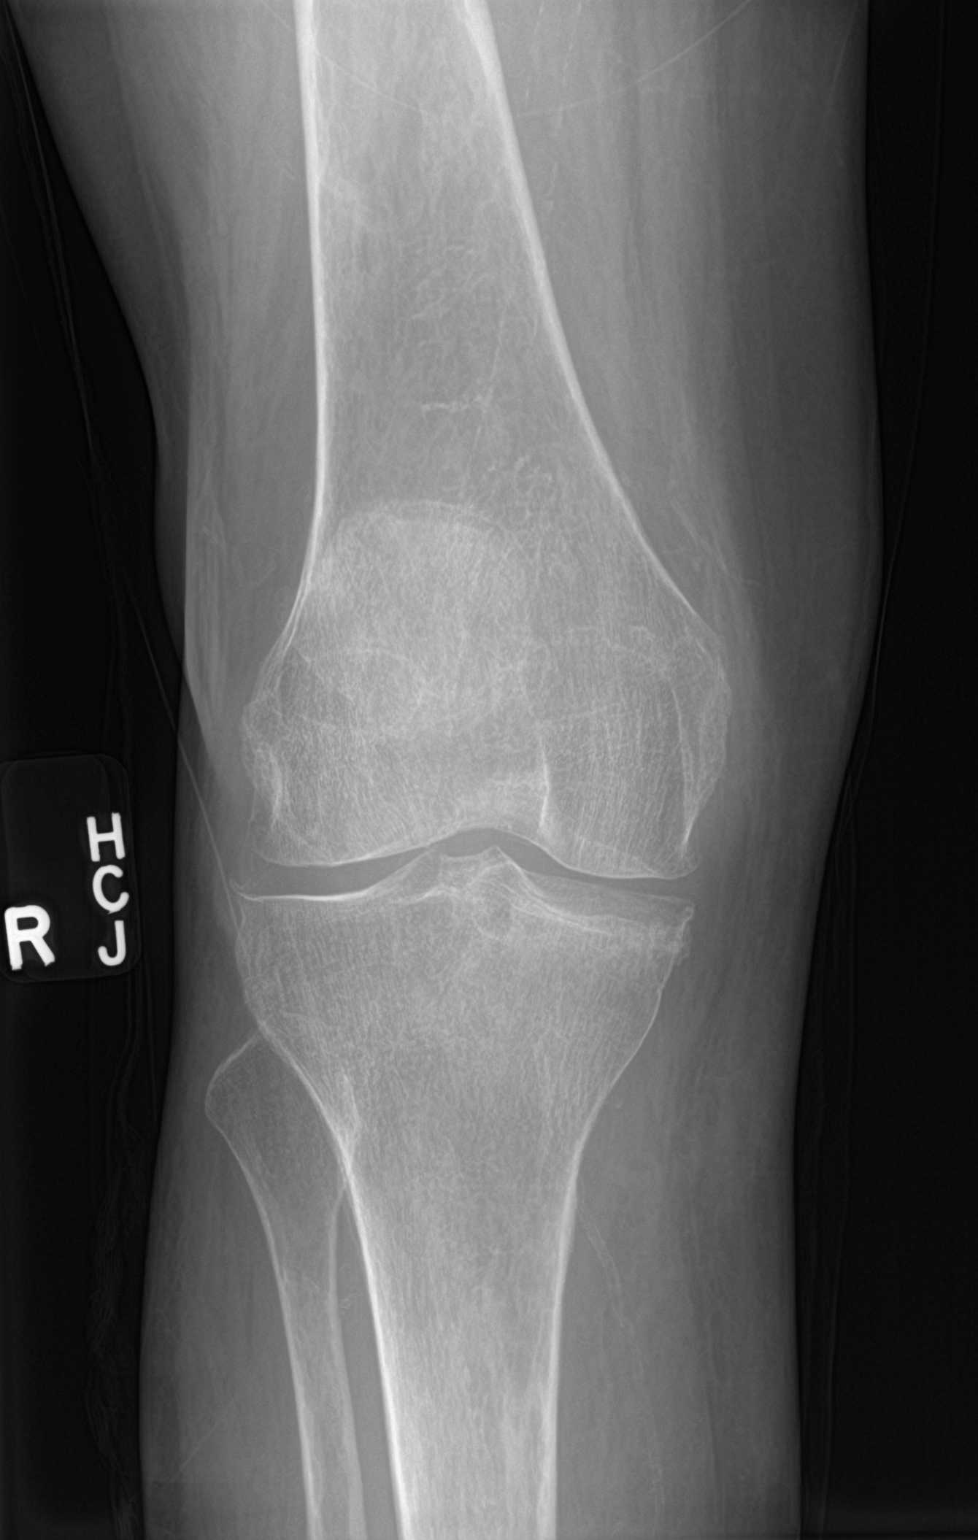

[knee lat]
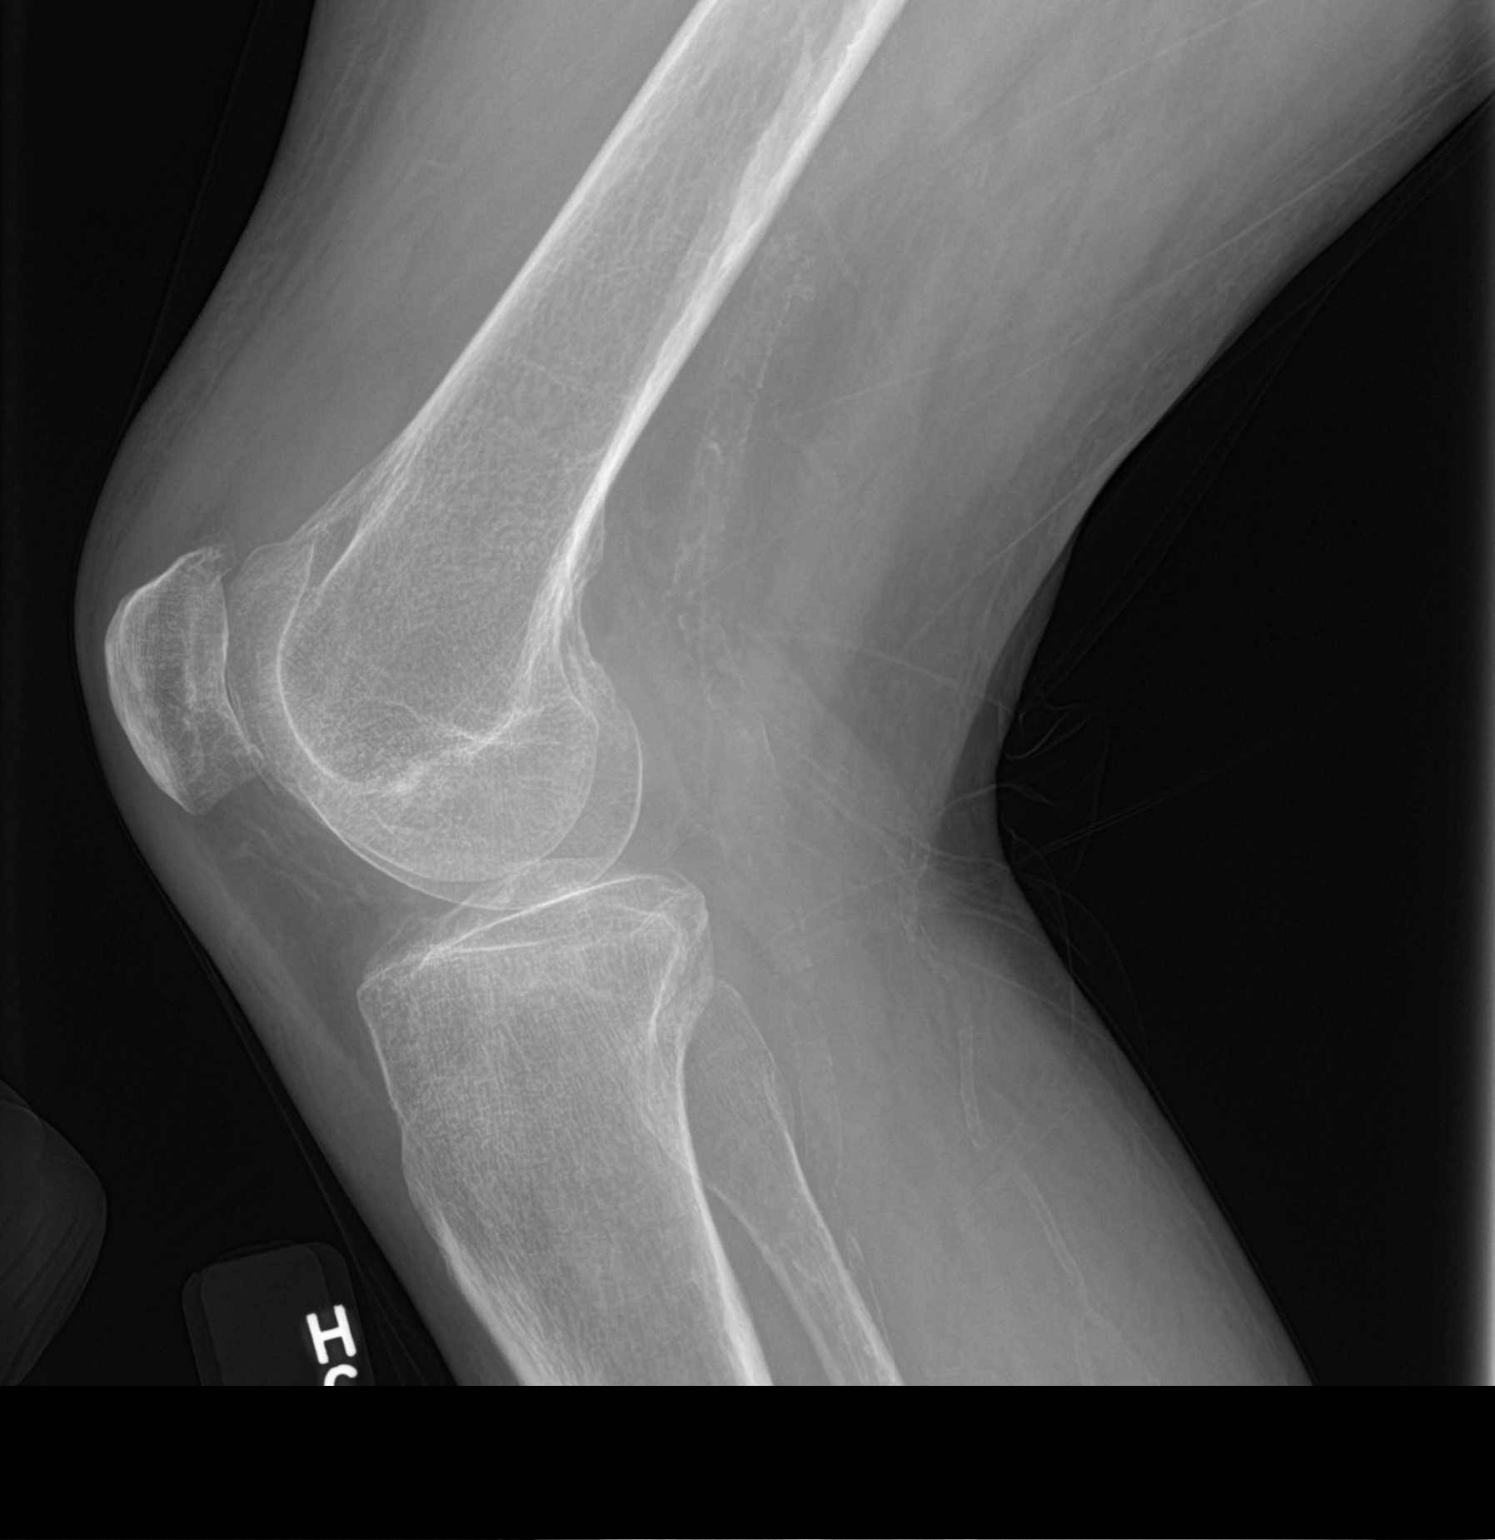

[2 of 2 positions shown; findings below may reference images not displayed]

FINDINGS: No evidence of fracture or dislocation. No definite joint effusion.
No evidence of arthropathy or other focal bone abnormality. Soft
tissues are unremarkable. Vascular calcifications.
IMPRESSION: No acute displaced fracture or dislocation.

## 2021-01-24 NOTE — Therapy (Signed)
[place], [place], [address] Phone: [phone]   Fax:  [phone]  Physical Therapy Treatment  Patient Details  Name: Melanie Hall MRN: [id number] Date of Birth: April 06, 1971 Referring Provider (PT): Alysia Penna MD   Encounter Date: 01/24/2021   PT End of Session - 01/24/21 1310     Visit Number 7    Number of Visits 12    Date for PT Re-Evaluation 01/31/21    Authorization Type Medicaid    Authorization Time Period 10 visits 01/09/21-02/12/21    Authorization - Visit Number 4    Authorization - Number of Visits 10    PT Start Time 6301    PT Stop Time 1343    PT Time Calculation (min) 38 min    Activity Tolerance Patient tolerated treatment well;Patient limited by pain    Behavior During Therapy [facility] for tasks assessed/performed             Past Medical History:  Diagnosis Date   Acute cystitis without hematuria    Acute midline thoracic back pain 12/08/930   Acute systolic heart failure ([place]) 10/21/2019   Anemia    Arthritis    Cataracts, bilateral    surgery to remove   CATARACTS, BILATERAL 07/02/2007   Qualifier: Diagnosis of  By: Isla Pence     Closed fracture of left femur ([place]) 10/28/2018   Closed fracture of right ankle 11/06/2017   Diabetes mellitus    type 2   Emphysematous cystitis 08/26/2018   Encounter for gynecological examination with Papanicolaou smear of cervix 01/14/2020   Encounter for screening fecal occult blood testing 01/14/2020   Encounter for screening for malignant neoplasm of cervix 12/29/2019   Encounter for screening mammogram for malignant neoplasm of breast 12/29/2019   ESRD on hemodialysis ([place])    MWF - in [place]   GERD (gastroesophageal reflux disease)    Hyperlipidemia    Hypertension    Hypokalemia 08/26/2018   IRREGULAR MENSES 09/14/2009   Qualifier: Diagnosis of  By: Hassell Done FNP, Nykedtra     Loose stools 11/12/2019   Normocytic anemia 08/26/2018    PARONYCHIA, RIGHT GREAT TOE 07/30/2008   Qualifier: Diagnosis of  By: Hassell Done FNP, Nykedtra     Pressure ulcer 09/10/2019   Right arm weakness 08/08/2019   Sprain of left ankle    STEMI (ST elevation myocardial infarction) ([place]) 10/18/2019   STEMI involving right coronary artery ([place]) 10/18/2019   Stroke ([place]) 10/2019   SVD (spontaneous vaginal delivery)    x 4   Vaginosis 08/26/2018   Weakness 09/10/2019   Weakness of both lower extremities     Past Surgical History:  Procedure Laterality Date   A/V FISTULAGRAM Left 01/12/2020   Procedure: A/V FISTULAGRAM;  Surgeon: Serafina Mitchell, MD;  Location: [place] [facility];  Service: Cardiovascular;  Laterality: Left;   A/V FISTULAGRAM N/A 03/17/2020   Procedure: A/V FISTULAGRAM - Left Arm;  Surgeon: Marty Heck, MD;  Location: [place] [facility];  Service: Cardiovascular;  Laterality: N/A;   A/V FISTULAGRAM Left 06/23/2020   Procedure: A/V FISTULAGRAM;  Surgeon: Marty Heck, MD;  Location: [place] [facility];  Service: Cardiovascular;  Laterality: Left;   ANKLE FUSION Left 06/01/2020   Procedure: LEFT ANKLE FUSION;  Surgeon: Newt Minion, MD;  Location: [place];  Service: Orthopedics;  Laterality: Left;   AV FISTULA PLACEMENT Left 08/17/2019   Procedure: LEFT BRACHIAL CEPHALIC ARTERIOVENOUS (AV) FISTULA;  Surgeon: Angelia Mould, MD;  Location: [place];  Service: Vascular;  Laterality: Left;   CORONARY STENT INTERVENTION N/A 10/18/2019   Procedure: CORONARY STENT INTERVENTION;  Surgeon: Sherren Mocha, MD;  Location: [place] [facility];  Service: Cardiovascular;  Laterality: N/A;   CORONARY/GRAFT ACUTE MI REVASCULARIZATION N/A 10/18/2019   Procedure: Coronary/Graft Acute MI Revascularization;  Surgeon: Sherren Mocha, MD;  Location: [place] [facility];  Service: Cardiovascular;  Laterality: N/A;   EYE SURGERY Bilateral    cataracts removed   FEMUR IM NAIL Left 10/28/2018   Procedure: RETROGRADE FEMORAL NAILING;  Surgeon: Meredith Pel, MD;  Location: [place];  Service: Orthopedics;  Laterality: Left;   IM NAILING FEMORAL SHAFT RETROGRADE Left 10/28/2018   INTRAVASCULAR ULTRASOUND/IVUS N/A 10/18/2019   Procedure: Intravascular Ultrasound/IVUS;  Surgeon: Sherren Mocha, MD;  Location: [place] [facility];  Service: Cardiovascular;  Laterality: N/A;   IR FLUORO GUIDE CV LINE RIGHT  08/11/2019   IR THORACENTESIS ASP PLEURAL SPACE W/IMG GUIDE  11/12/2019   IR US GUIDE VASC ACCESS RIGHT  08/11/2019   KNEE SURGERY Left    LEFT HEART CATH AND CORONARY ANGIOGRAPHY N/A 10/18/2019   Procedure: LEFT HEART CATH AND CORONARY ANGIOGRAPHY;  Surgeon: Sherren Mocha, MD;  Location: [place] [facility];  Service: Cardiovascular;  Laterality: N/A;   PERIPHERAL VASCULAR BALLOON ANGIOPLASTY Left 01/12/2020   Procedure: PERIPHERAL VASCULAR BALLOON ANGIOPLASTY;  Surgeon: Serafina Mitchell, MD;  Location: [place] [facility];  Service: Cardiovascular;  Laterality: Left;  AVF   PERIPHERAL VASCULAR BALLOON ANGIOPLASTY Left 03/17/2020   Procedure: PERIPHERAL VASCULAR BALLOON ANGIOPLASTY;  Surgeon: Marty Heck, MD;  Location: [place] [facility];  Service: Cardiovascular;  Laterality: Left;  AVF   PERIPHERAL VASCULAR BALLOON ANGIOPLASTY Left 05/05/2020   Procedure: PERIPHERAL VASCULAR BALLOON ANGIOPLASTY;  Surgeon: Marty Heck, MD;  Location: [place] [facility];  Service: Cardiovascular;  Laterality: Left;  arm fistula   RADIOLOGY WITH ANESTHESIA N/A 09/15/2019   Procedure: [facility] AND LUMBER LOWER BACK PAIN;  Surgeon: Radiologist, Medication, MD;  Location: [place];  Service: Radiology;  Laterality: N/A;   TUBAL LIGATION      There were no vitals filed for this visit.   Subjective Assessment - 01/24/21 1308     Subjective Things "pretty good" with HEP. Pain is not as bad, still has flare ups once in a while " I dont understand why"    Currently in Pain? Yes    Pain Score 6     Pain Location Knee    Pain Orientation Right    Pain  Descriptors / Indicators Aching    Pain Type Chronic pain    Pain Onset More than a month ago    Pain Frequency Intermittent                               [facility] Adult PT Treatment/Exercise - 01/24/21 0001       Knee/Hip Exercises: Stretches   Other Knee/Hip Stretches LTR 10 x 10"      Knee/Hip Exercises: Standing   Heel Raises Both;20 reps      Knee/Hip Exercises: Seated   Long Arc Quad Both;10 reps;1 set    Other Seated Knee/Hip Exercises seated band rows RTB x 15    Sit to Sand 2 sets;10 reps;with UE support      Knee/Hip Exercises: Supine   Bridges Both;2 sets;10 reps  Straight Leg Raises Both;2 sets;10 reps    Other Supine Knee/Hip Exercises ab set x10, ab march x20, scapular retraction 10 x 5"                      PT Short Term Goals - 01/05/21 5638       PT SHORT TERM GOAL #1   Title Patient will be independent with initial HEP and self-management strategies to improve functional outcomes    Time 3    Period Weeks    Status Achieved    Target Date 01/10/21      PT SHORT TERM GOAL #2   Title Patient will be able to ambulate at least 175 feet during 2MWT with LRAD to demonstrate improved ability to perform functional mobility and associated tasks.    Time 3    Period Weeks    Status On-going    Target Date 01/10/21               PT Long Term Goals - 01/05/21 0959       PT LONG TERM GOAL #1   Title Patient will be able to ambulate at least 250 feet during 2MWT with LRAD to demonstrate improved ability to perform functional mobility and associated tasks.    Time 6    Period Weeks    Status On-going      PT LONG TERM GOAL #2   Title Patient will be able to perform stand x 5 in < 12 seconds to demonstrate improvement in functional mobility and reduced risk for falls.    Time 6    Period Weeks    Status On-going      PT LONG TERM GOAL #3   Title Patient will report at least 65% overall improvement in subjective  complaint to indicate improvement in ability to perform ADLs.    Time 6    Period Weeks    Status On-going                   Plan - 01/24/21 1340     Clinical Impression Statement Progressed postural strength activity with added seated band rows. Patient tolerated this well but notes increased knee pain with standing activity. Slow progression to WB activity. Patient cued on correct posturing with seated exercise, also for core engagement during straight leg raise for reduce discomfort in lumbar. Patient will continue to benefit from skilled therapy services to reduce deficits and improve functional ability.    Examination-Activity Limitations Transfers;Locomotion Level    Examination-Participation Restrictions Community Activity;Other    Stability/Clinical Decision Making Stable/Uncomplicated    Rehab Potential Fair    PT Frequency 2x / week    PT Duration 6 weeks    PT Treatment/Interventions ADLs/Self Care Home Management;Aquatic Therapy;Stair training;Biofeedback;Ultrasound;Traction;Functional mobility training;Therapeutic activities;Neuromuscular re-education;Balance training;Scar mobilization;Passive range of motion;Visual/perceptual remediation/compensation;Dry needling;Manual techniques;Patient/family education;Manual lymph drainage;Energy conservation;Spinal Manipulations;Splinting;Taping;Joint Manipulations;Vasopneumatic Device;Compression bandaging;Orthotic Fit/Training;Therapeutic exercise;Fluidtherapy;Contrast Bath;Cryotherapy;Electrical Stimulation;Parrafin;Iontophoresis 4mg /ml Dexamethasone;DME Instruction;Gait training;Moist Heat    PT Next Visit Plan Progress core and postural strength as tolerated. Continue slow progression to WB, try sidestepping wiht UE support.    PT Home Exercise Plan Eval: seated march, heel/ toe raise, scapular retraction 6/30:  towel stretch for gastroc 7/7/ bridge, ab set, ab march 7/14 ab march, clamshell    Consulted and Agree with Plan of Care  Patient             Patient will benefit from skilled therapeutic intervention in order to improve the  following deficits and impairments:  Abnormal gait, Decreased endurance, Decreased activity tolerance, Decreased balance, Decreased mobility, Difficulty walking, Decreased range of motion, Pain, Decreased strength  Visit Diagnosis: Pain in thoracic spine  Other abnormalities of gait and mobility  Difficulty in walking, not elsewhere classified     Problem List Patient Active Problem List   Diagnosis Date Noted   Pain and tenderness 11/10/2020   Non-intractable vomiting 06/16/2020   Encounter for support and coordination of transition of care 06/16/2020   S/P ankle fusion 06/01/2020   Pilon fracture of left tibia, closed, initial encounter    Displaced trimalleolar fracture of left lower leg, initial encounter for closed fracture    Abnormality of gait and mobility 05/26/2020   Migraine 05/26/2020   Diabetic retinopathy ([place]) 05/04/2020   Chronic diarrhea of unknown origin 01/14/2020   Type 2 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, with long-term current use of insulin ([place]) 12/29/2019   Hypothyroidism 12/29/2019   Weakness of both lower extremities 11/12/2019   Debility 09/10/2019   ESRD (end stage renal disease) on dialysis ([place]) 09/10/2019   Depression 09/10/2019   Incontinence in female 09/10/2019   Anemia in chronic kidney disease 08/19/2019   MGUS (monoclonal gammopathy of unknown significance) 07/23/2019   GERD (gastroesophageal reflux disease) 02/28/2019   Essential hypertension 10/28/2018   Irregular menses 09/14/2009   Hypercholesterolemia 11/19/2008   Type 2 diabetes with nephropathy ([place]) 07/02/2007   1:45 PM, 01/24/21 Josue Hector PT DPT  Physical Therapist with [facility]  [phone] [place] [address] [place], [place], [address] Phone:  [phone]   Fax:  [phone]  Name: Melanie Hall MRN: [id number] Date of Birth: [birth year]

## 2021-01-26 ENCOUNTER — Ambulatory Visit (INDEPENDENT_AMBULATORY_CARE_PROVIDER_SITE_OTHER): Payer: Medicaid Other | Admitting: Gastroenterology

## 2021-01-26 ENCOUNTER — Encounter (INDEPENDENT_AMBULATORY_CARE_PROVIDER_SITE_OTHER): Payer: Self-pay | Admitting: Gastroenterology

## 2021-01-26 ENCOUNTER — Ambulatory Visit (HOSPITAL_COMMUNITY): Payer: Medicaid Other | Admitting: Physical Therapy

## 2021-01-26 ENCOUNTER — Other Ambulatory Visit: Payer: Self-pay

## 2021-01-26 ENCOUNTER — Encounter (HOSPITAL_COMMUNITY): Payer: Self-pay | Admitting: Physical Therapy

## 2021-01-26 VITALS — BP 166/75 | HR 99 | Temp 98.1°F | Ht 63.0 in | Wt 151.0 lb

## 2021-01-26 DIAGNOSIS — R112 Nausea with vomiting, unspecified: Secondary | ICD-10-CM | POA: Diagnosis not present

## 2021-01-26 DIAGNOSIS — K529 Noninfective gastroenteritis and colitis, unspecified: Secondary | ICD-10-CM | POA: Diagnosis not present

## 2021-01-26 DIAGNOSIS — M546 Pain in thoracic spine: Secondary | ICD-10-CM | POA: Diagnosis not present

## 2021-01-26 DIAGNOSIS — R262 Difficulty in walking, not elsewhere classified: Secondary | ICD-10-CM

## 2021-01-26 DIAGNOSIS — R2689 Other abnormalities of gait and mobility: Secondary | ICD-10-CM

## 2021-01-26 MED ORDER — COLESTIPOL HCL 1 G PO TABS: 1.0000 g | ORAL_TABLET | Freq: Two times a day (BID) | ORAL | 3 refills | Status: DC

## 2021-01-26 NOTE — Patient Instructions (Addendum)
Schedule EGD and colonoscopy Perform stool workup Start colestipol 2 g qday If persistent diarrhea, can take Imodium as needed

## 2021-01-26 NOTE — Patient Instructions (Addendum)
Access Code: [email or web address] URL: [email or web address] Date: 01/26/2021 Prepared by: Josue Hector  Exercises Sit to Stand with Counter Support - 2 x daily - 7 x weekly - 2 sets - 10 reps Heel Raises with Counter Support - 2 x daily - 7 x weekly - 2 sets - 10 reps

## 2021-01-26 NOTE — Therapy (Signed)
[place], [place], [address] Phone: [phone]   Fax:  [phone]  Physical Therapy Treatment  Patient Details  Name: Melanie Hall MRN: [id number] Date of Birth: [birth year] Referring Provider (PT): Alysia Penna MD   Encounter Date: 01/26/2021   PT End of Session - 01/26/21 1025     Visit Number 8    Number of Visits 12    Date for PT Re-Evaluation 01/31/21    Authorization Type Medicaid    Authorization Time Period 10 visits 01/09/21-02/12/21    Authorization - Visit Number 5    Authorization - Number of Visits 10    Progress Note Due on Visit 12    PT Start Time 1018    PT Stop Time 1056    PT Time Calculation (min) 38 min    Activity Tolerance Patient tolerated treatment well    Behavior During Therapy [facility] for tasks assessed/performed             Past Medical History:  Diagnosis Date   Acute cystitis without hematuria    Acute midline thoracic back pain 9/50/9326   Acute systolic heart failure ([place]) 10/21/2019   Anemia    Arthritis    Cataracts, bilateral    surgery to remove   CATARACTS, BILATERAL 07/02/2007   Qualifier: Diagnosis of  By: Isla Pence     Closed fracture of left femur ([place]) 10/28/2018   Closed fracture of right ankle 11/06/2017   Diabetes mellitus    type 2   Emphysematous cystitis 08/26/2018   Encounter for gynecological examination with Papanicolaou smear of cervix 01/14/2020   Encounter for screening fecal occult blood testing 01/14/2020   Encounter for screening for malignant neoplasm of cervix 12/29/2019   Encounter for screening mammogram for malignant neoplasm of breast 12/29/2019   ESRD on hemodialysis ([place])    MWF - in [place]   GERD (gastroesophageal reflux disease)    Hyperlipidemia    Hypertension    Hypokalemia 08/26/2018   IRREGULAR MENSES 09/14/2009   Qualifier: Diagnosis of  By: Hassell Done FNP, Nykedtra     Loose stools 11/12/2019   Normocytic anemia 08/26/2018    PARONYCHIA, RIGHT GREAT TOE 07/30/2008   Qualifier: Diagnosis of  By: Hassell Done FNP, Nykedtra     Pressure ulcer 09/10/2019   Right arm weakness 08/08/2019   Sprain of left ankle    STEMI (ST elevation myocardial infarction) ([place]) 10/18/2019   STEMI involving right coronary artery ([place]) 10/18/2019   Stroke ([place]) 10/2019   SVD (spontaneous vaginal delivery)    x 4   Vaginosis 08/26/2018   Weakness 09/10/2019   Weakness of both lower extremities     Past Surgical History:  Procedure Laterality Date   A/V FISTULAGRAM Left 01/12/2020   Procedure: A/V FISTULAGRAM;  Surgeon: Serafina Mitchell, MD;  Location: [place] [facility];  Service: Cardiovascular;  Laterality: Left;   A/V FISTULAGRAM N/A 03/17/2020   Procedure: A/V FISTULAGRAM - Left Arm;  Surgeon: Marty Heck, MD;  Location: [place] [facility];  Service: Cardiovascular;  Laterality: N/A;   A/V FISTULAGRAM Left 06/23/2020   Procedure: A/V FISTULAGRAM;  Surgeon: Marty Heck, MD;  Location: [place] [facility];  Service: Cardiovascular;  Laterality: Left;   ANKLE FUSION Left 06/01/2020   Procedure: LEFT ANKLE FUSION;  Surgeon: Newt Minion, MD;  Location: [place];  Service: Orthopedics;  Laterality: Left;   AV FISTULA PLACEMENT Left 08/17/2019   Procedure:  LEFT BRACHIAL CEPHALIC ARTERIOVENOUS (AV) FISTULA;  Surgeon: Angelia Mould, MD;  Location: [place];  Service: Vascular;  Laterality: Left;   CORONARY STENT INTERVENTION N/A 10/18/2019   Procedure: CORONARY STENT INTERVENTION;  Surgeon: Sherren Mocha, MD;  Location: [place] [facility];  Service: Cardiovascular;  Laterality: N/A;   CORONARY/GRAFT ACUTE MI REVASCULARIZATION N/A 10/18/2019   Procedure: Coronary/Graft Acute MI Revascularization;  Surgeon: Sherren Mocha, MD;  Location: [place] [facility];  Service: Cardiovascular;  Laterality: N/A;   EYE SURGERY Bilateral    cataracts removed   FEMUR IM NAIL Left 10/28/2018   Procedure: RETROGRADE FEMORAL NAILING;  Surgeon: Meredith Pel, MD;  Location: [place];  Service: Orthopedics;  Laterality: Left;   IM NAILING FEMORAL SHAFT RETROGRADE Left 10/28/2018   INTRAVASCULAR ULTRASOUND/IVUS N/A 10/18/2019   Procedure: Intravascular Ultrasound/IVUS;  Surgeon: Sherren Mocha, MD;  Location: [place] [facility];  Service: Cardiovascular;  Laterality: N/A;   IR FLUORO GUIDE CV LINE RIGHT  08/11/2019   IR THORACENTESIS ASP PLEURAL SPACE W/IMG GUIDE  11/12/2019   IR US GUIDE VASC ACCESS RIGHT  08/11/2019   KNEE SURGERY Left    LEFT HEART CATH AND CORONARY ANGIOGRAPHY N/A 10/18/2019   Procedure: LEFT HEART CATH AND CORONARY ANGIOGRAPHY;  Surgeon: Sherren Mocha, MD;  Location: [place] [facility];  Service: Cardiovascular;  Laterality: N/A;   PERIPHERAL VASCULAR BALLOON ANGIOPLASTY Left 01/12/2020   Procedure: PERIPHERAL VASCULAR BALLOON ANGIOPLASTY;  Surgeon: Serafina Mitchell, MD;  Location: [place] [facility];  Service: Cardiovascular;  Laterality: Left;  AVF   PERIPHERAL VASCULAR BALLOON ANGIOPLASTY Left 03/17/2020   Procedure: PERIPHERAL VASCULAR BALLOON ANGIOPLASTY;  Surgeon: Marty Heck, MD;  Location: [place] [facility];  Service: Cardiovascular;  Laterality: Left;  AVF   PERIPHERAL VASCULAR BALLOON ANGIOPLASTY Left 05/05/2020   Procedure: PERIPHERAL VASCULAR BALLOON ANGIOPLASTY;  Surgeon: Marty Heck, MD;  Location: [place] [facility];  Service: Cardiovascular;  Laterality: Left;  arm fistula   RADIOLOGY WITH ANESTHESIA N/A 09/15/2019   Procedure: [facility] AND LUMBER LOWER BACK PAIN;  Surgeon: Radiologist, Medication, MD;  Location: [place];  Service: Radiology;  Laterality: N/A;   TUBAL LIGATION      There were no vitals filed for this visit.   Subjective Assessment - 01/26/21 1024     Subjective Patient says her knee is a little better, her back is a lot better.    Currently in Pain? Yes    Pain Score 3     Pain Location Back    Pain Orientation Posterior;Lower    Pain Descriptors / Indicators Aching     Pain Type Chronic pain    Pain Onset More than a month ago                               [facility] Adult PT Treatment/Exercise - 01/26/21 0001       Knee/Hip Exercises: Stretches   Other Knee/Hip Stretches LTR 10 x 10"      Knee/Hip Exercises: Standing   Heel Raises Both;20 reps    Other Standing Knee Exercises sidestepping 5RT at mat      Knee/Hip Exercises: Seated   Long Arc Quad Both;10 reps;2 sets    Other Seated Knee/Hip Exercises seated band rows RTB x 20    Sit to Sand 2 sets;10 reps;with UE support      Knee/Hip Exercises: Supine   Bridges Both;2 sets;10 reps  Straight Leg Raises Both;2 sets;10 reps    Other Supine Knee/Hip Exercises ab march x20, scapular retraction 20 x 5"                      PT Short Term Goals - 01/05/21 9371       PT SHORT TERM GOAL #1   Title Patient will be independent with initial HEP and self-management strategies to improve functional outcomes    Time 3    Period Weeks    Status Achieved    Target Date 01/10/21      PT SHORT TERM GOAL #2   Title Patient will be able to ambulate at least 175 feet during 2MWT with LRAD to demonstrate improved ability to perform functional mobility and associated tasks.    Time 3    Period Weeks    Status On-going    Target Date 01/10/21               PT Long Term Goals - 01/05/21 0959       PT LONG TERM GOAL #1   Title Patient will be able to ambulate at least 250 feet during 2MWT with LRAD to demonstrate improved ability to perform functional mobility and associated tasks.    Time 6    Period Weeks    Status On-going      PT LONG TERM GOAL #2   Title Patient will be able to perform stand x 5 in < 12 seconds to demonstrate improvement in functional mobility and reduced risk for falls.    Time 6    Period Weeks    Status On-going      PT LONG TERM GOAL #3   Title Patient will report at least 65% overall improvement in subjective complaint to indicate  improvement in ability to perform ADLs.    Time 6    Period Weeks    Status On-going                   Plan - 01/26/21 1108     Clinical Impression Statement Patient tolerated WB progressions well today with no increased complaint of pain. Patient cued on pacing of activity and deliberate foot placement to reduce strain and avoid painful movements. Overall pain levels decreasing per subjective report. Will continue to progress activities as tolerated. Reassess next visit.    Examination-Activity Limitations Transfers;Locomotion Level    Examination-Participation Restrictions Community Activity;Other    Stability/Clinical Decision Making Stable/Uncomplicated    Rehab Potential Fair    PT Frequency 2x / week    PT Duration 6 weeks    PT Treatment/Interventions ADLs/Self Care Home Management;Aquatic Therapy;Stair training;Biofeedback;Ultrasound;Traction;Functional mobility training;Therapeutic activities;Neuromuscular re-education;Balance training;Scar mobilization;Passive range of motion;Visual/perceptual remediation/compensation;Dry needling;Manual techniques;Patient/family education;Manual lymph drainage;Energy conservation;Spinal Manipulations;Splinting;Taping;Joint Manipulations;Vasopneumatic Device;Compression bandaging;Orthotic Fit/Training;Therapeutic exercise;Fluidtherapy;Contrast Bath;Cryotherapy;Electrical Stimulation;Parrafin;Iontophoresis 4mg /ml Dexamethasone;DME Instruction;Gait training;Moist Heat    PT Next Visit Plan Progress core and postural strength as tolerated. Continue slow progression to WB. Reassess next visit    PT Home Exercise Plan Eval: seated march, heel/ toe raise, scapular retraction 6/30:  towel stretch for gastroc 7/7/ bridge, ab set, ab march 7/14 ab march, clamshell 7/21 sit/stand, heel raises    Consulted and Agree with Plan of Care Patient             Patient will benefit from skilled therapeutic intervention in order to improve the following  deficits and impairments:  Abnormal gait, Decreased endurance, Decreased activity tolerance, Decreased balance, Decreased mobility, Difficulty walking,  Decreased range of motion, Pain, Decreased strength  Visit Diagnosis: Pain in thoracic spine  Other abnormalities of gait and mobility  Difficulty in walking, not elsewhere classified     Problem List Patient Active Problem List   Diagnosis Date Noted   Pain and tenderness 11/10/2020   Non-intractable vomiting 06/16/2020   Encounter for support and coordination of transition of care 06/16/2020   S/P ankle fusion 06/01/2020   Pilon fracture of left tibia, closed, initial encounter    Displaced trimalleolar fracture of left lower leg, initial encounter for closed fracture    Abnormality of gait and mobility 05/26/2020   Migraine 05/26/2020   Diabetic retinopathy ([place]) 05/04/2020   Chronic diarrhea of unknown origin 01/14/2020   Type 2 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, with long-term current use of insulin ([place]) 12/29/2019   Hypothyroidism 12/29/2019   Weakness of both lower extremities 11/12/2019   Debility 09/10/2019   ESRD (end stage renal disease) on dialysis ([place]) 09/10/2019   Depression 09/10/2019   Incontinence in female 09/10/2019   Anemia in chronic kidney disease 08/19/2019   MGUS (monoclonal gammopathy of unknown significance) 07/23/2019   GERD (gastroesophageal reflux disease) 02/28/2019   Essential hypertension 10/28/2018   Irregular menses 09/14/2009   Hypercholesterolemia 11/19/2008   Type 2 diabetes with nephropathy ([place]) 07/02/2007   11:12 AM, 01/26/21 Josue Hector PT DPT  Physical Therapist with [facility]  [phone] [place], [place], [address] Phone: [phone]   Fax:  [phone]  Name: Melanie Hall MRN: [id number] Date of Birth: February 18, 1971

## 2021-01-26 NOTE — Progress Notes (Addendum)
Melanie Hall, M.D. Gastroenterology & Hepatology [facility] [address] [place], [place] [address]  Primary Care Physician: Noreene Larsson, NP [address]  [address] [place] [address]  I will communicate my assessment and recommendations to the referring MD via EMR.  Problems: Chronic diarrhea, ?diabetic diarrhea  History of Present Illness: Melanie Hall is a 50 y.o. female with past medical history of diabetes complicated by end-stage renal disease on hemodialysis, coronary artery disease and MI status post multiple stents in March 2021 on dual antiplatelet therapy, heart failure, GERD, hypertension, stroke, who presents for follow up of chronic diarrhea.  The patient was last seen on 03/24/2020. At that time, the patient underwent stool testing that was negative for any gastrointestinal infection and had normal celiac serology.  For some reason the electrolytes and the stool were never processed.  She was scheduled for a colonoscopy but she canceled this procedure as she had an ankle fracture and never rescheduled.  She was advised to continue taking Imodium as needed.  Was ordered to perform an abdominal x-ray but did not perform this as well.  The patient states that depending on how many bowel movements she has every day, she needs to take between 2-8 tablets of Imodium every day. Her bowel movement frequency varies. Some days may not have any Bms, but other times it can be up to 8 times per day.  She describes the bowel movements as watery without melena or hematochezia.  She has also nausea once or twice every other day. She has vomiting episodes every couple of weeks. She reports having some episdoes of vomiting in multiple episdes. States the Zofran sometimes help for her nausea.  The patient denies having any fever, chills, hematochezia, melena, hematemesis, abdominal distention, abdominal pain, jaundice, pruritus  or weight loss/gain.  Last Colonoscopy: Never  Past Medical History: Past Medical History:  Diagnosis Date   Acute cystitis without hematuria    Acute midline thoracic back pain 0/98/1191   Acute systolic heart failure ([place]) 10/21/2019   Anemia    Arthritis    Cataracts, bilateral    surgery to remove   CATARACTS, BILATERAL 07/02/2007   Qualifier: Diagnosis of  By: Isla Pence     Closed fracture of left femur ([place]) 10/28/2018   Closed fracture of right ankle 11/06/2017   Diabetes mellitus    type 2   Emphysematous cystitis 08/26/2018   Encounter for gynecological examination with Papanicolaou smear of cervix 01/14/2020   Encounter for screening fecal occult blood testing 01/14/2020   Encounter for screening for malignant neoplasm of cervix 12/29/2019   Encounter for screening mammogram for malignant neoplasm of breast 12/29/2019   ESRD on hemodialysis ([place])    MWF - in [place]   GERD (gastroesophageal reflux disease)    Hyperlipidemia    Hypertension    Hypokalemia 08/26/2018   IRREGULAR MENSES 09/14/2009   Qualifier: Diagnosis of  By: Hassell Done FNP, [place]     Loose stools 11/12/2019   Normocytic anemia 08/26/2018   PARONYCHIA, RIGHT GREAT TOE 07/30/2008   Qualifier: Diagnosis of  By: Hassell Done FNP, [place]     Pressure ulcer 09/10/2019   Right arm weakness 08/08/2019   Sprain of left ankle    STEMI (ST elevation myocardial infarction) ([place]) 10/18/2019   STEMI involving right coronary artery ([place]) 10/18/2019   Stroke ([place]) 10/2019   SVD (spontaneous vaginal delivery)    x 4   Vaginosis 08/26/2018  Weakness 09/10/2019   Weakness of both lower extremities     Past Surgical History: Past Surgical History:  Procedure Laterality Date   A/V FISTULAGRAM Left 01/12/2020   Procedure: A/V FISTULAGRAM;  Surgeon: Serafina Mitchell, MD;  Location: [place] [facility];  Service: Cardiovascular;  Laterality: Left;   A/V FISTULAGRAM N/A 03/17/2020   Procedure: A/V FISTULAGRAM - Left Arm;  Surgeon:  Marty Heck, MD;  Location: [facility] [place] [facility];  Service: Cardiovascular;  Laterality: N/A;   A/V FISTULAGRAM Left 06/23/2020   Procedure: A/V FISTULAGRAM;  Surgeon: Marty Heck, MD;  Location: [place];  Service: Cardiovascular;  Laterality: Left;   ANKLE FUSION Left 06/01/2020   Procedure: LEFT ANKLE FUSION;  Surgeon: Newt Minion, MD;  Location: [place];  Service: Orthopedics;  Laterality: Left;   AV FISTULA PLACEMENT Left 08/17/2019   Procedure: LEFT BRACHIAL CEPHALIC ARTERIOVENOUS (AV) FISTULA;  Surgeon: Angelia Mould, MD;  Location: [place];  Service: Vascular;  Laterality: Left;   CORONARY STENT INTERVENTION N/A 10/18/2019   Procedure: CORONARY STENT INTERVENTION;  Surgeon: Sherren Mocha, MD;  Location: [place] [facility];  Service: Cardiovascular;  Laterality: N/A;   CORONARY/GRAFT ACUTE MI REVASCULARIZATION N/A 10/18/2019   Procedure: Coronary/Graft Acute MI Revascularization;  Surgeon: Sherren Mocha, MD;  Location: [place] [facility];  Service: Cardiovascular;  Laterality: N/A;   EYE SURGERY Bilateral    cataracts removed   FEMUR IM NAIL Left 10/28/2018   Procedure: RETROGRADE FEMORAL NAILING;  Surgeon: Meredith Pel, MD;  Location: [place];  Service: Orthopedics;  Laterality: Left;   IM NAILING FEMORAL SHAFT RETROGRADE Left 10/28/2018   INTRAVASCULAR ULTRASOUND/IVUS N/A 10/18/2019   Procedure: Intravascular Ultrasound/IVUS;  Surgeon: Sherren Mocha, MD;  Location: [place] [facility];  Service: Cardiovascular;  Laterality: N/A;   IR FLUORO GUIDE CV LINE RIGHT  08/11/2019   IR THORACENTESIS ASP PLEURAL SPACE W/IMG GUIDE  11/12/2019   IR US GUIDE VASC ACCESS RIGHT  08/11/2019   KNEE SURGERY Left    LEFT HEART CATH AND CORONARY ANGIOGRAPHY N/A 10/18/2019   Procedure: LEFT HEART CATH AND CORONARY ANGIOGRAPHY;  Surgeon: Sherren Mocha, MD;  Location: [place];  Service: Cardiovascular;  Laterality: N/A;   PERIPHERAL VASCULAR BALLOON ANGIOPLASTY  Left 01/12/2020   Procedure: PERIPHERAL VASCULAR BALLOON ANGIOPLASTY;  Surgeon: Serafina Mitchell, MD;  Location: [facility];  Service: Cardiovascular;  Laterality: Left;  AVF   PERIPHERAL VASCULAR BALLOON ANGIOPLASTY Left 03/17/2020   Procedure: PERIPHERAL VASCULAR BALLOON ANGIOPLASTY;  Surgeon: Marty Heck, MD;  Location: [facility];  Service: Cardiovascular;  Laterality: Left;  AVF   PERIPHERAL VASCULAR BALLOON ANGIOPLASTY Left 05/05/2020   Procedure: PERIPHERAL VASCULAR BALLOON ANGIOPLASTY;  Surgeon: Marty Heck, MD;  Location: [place] [facility];  Service: Cardiovascular;  Laterality: Left;  arm fistula   RADIOLOGY WITH ANESTHESIA N/A 09/15/2019   Procedure: [facility] AND LUMBER LOWER BACK PAIN;  Surgeon: Radiologist, Medication, MD;  Location: [facility];  Service: Radiology;  Laterality: N/A;   TUBAL LIGATION      Family History: Family History  Problem Relation Age of Onset   Diabetes Mother    Hypertension Mother    Bipolar disorder Mother    Brain cancer Maternal Aunt    Heart disease Maternal Grandmother    Diabetes Maternal Grandmother    Breast cancer Maternal Aunt    Depression Father        Committed suicide  Diabetes Sister     Social History: Social History   Tobacco Use  Smoking Status Former   Packs/day: 0.25   Years: 2.00   Pack years: 0.50   Types: Cigarettes   Quit date: 1997   Years since quitting: 25.5  Smokeless Tobacco Never   Social History   Substance and Sexual Activity  Alcohol Use No   Social History   Substance and Sexual Activity  Drug Use No    Allergies: Allergies  Allergen Reactions   Contrast Media [Iodinated Diagnostic Agents] Nausea And Vomiting    Treated with Benadryl & Solumedrol   Penicillins Other (See Comments)    Don't want to take PCN due to family history     Medications: Current Outpatient Medications  Medication Sig Dispense Refill   aspirin 81 MG EC tablet Take 1 tablet (81 mg total)  by mouth daily. 90 tablet 3   atorvastatin (LIPITOR) 80 MG tablet Take 1 tablet (80 mg total) by mouth daily at 6 PM. 90 tablet 3   BRILINTA 90 MG TABS tablet Take 1 tablet by mouth twice daily 180 tablet 0   carvedilol (COREG) 6.25 MG tablet Take 1.5 tablets (9.375 mg total) by mouth 2 (two) times daily. 180 tablet 3   epoetin alfa (EPOGEN) 3000 UNIT/ML injection Inject 3,000 Units into the skin every 14 (fourteen) days.      gabapentin (NEURONTIN) 300 MG capsule Take 1 capsule (300 mg total) by mouth 3 (three) times daily. 90 capsule 0   hydrALAZINE (APRESOLINE) 10 MG tablet Take 1 tablet (10 mg total) by mouth in the morning and at bedtime. 180 tablet 3   insulin aspart protamine- aspart (NOVOLOG MIX 70/30) (70-30) 100 UNIT/ML injection Inject 0.1 mLs (10 Units total) into the skin 2 (two) times daily with a meal. 10 mL 3   isosorbide mononitrate (IMDUR) 30 MG 24 hr tablet Take 0.5 tablets (15 mg total) by mouth daily. 15 tablet 11   levothyroxine (SYNTHROID) 25 MCG tablet Take 1 tablet (25 mcg total) by mouth daily before breakfast. 30 tablet 1   methocarbamol (ROBAXIN) 500 MG tablet Take 1 tablet (500 mg total) by mouth every 8 (eight) hours as needed for muscle spasms. 45 tablet 1   multivitamin (RENA-VIT) TABS tablet Take 1 tablet by mouth at bedtime. 30 tablet 0   nitroGLYCERIN (NITROSTAT) 0.4 MG SL tablet Place 1 tablet (0.4 mg total) under the tongue every 5 (five) minutes x 3 doses as needed for chest pain. (Patient taking differently: Place 0.4 mg under the tongue every 5 (five) minutes as needed for chest pain (max 3 doses).) 25 tablet 3   ondansetron (ZOFRAN ODT) 4 MG disintegrating tablet Take 1 tablet (4 mg total) by mouth every 8 (eight) hours as needed for nausea or vomiting. 15 tablet 0   tamsulosin (FLOMAX) 0.4 MG CAPS capsule Take 1 capsule (0.4 mg total) by mouth daily after supper. 90 capsule 0   traMADol (ULTRAM) 50 MG tablet Take 1 tablet (50 mg total) by mouth daily as  needed. 30 tablet 5   lidocaine-prilocaine (EMLA) cream Apply topically. (Patient not taking: Reported on 01/26/2021)     sertraline (ZOLOFT) 25 MG tablet Take 1 tablet (25 mg total) by mouth daily. 30 tablet 0   silver sulfADIAZINE (SILVADENE) 1 % cream Apply 1 application topically daily. (Patient not taking: Reported on 01/26/2021) 50 g 0   Current Facility-Administered Medications  Medication Dose Route Frequency Provider Last Rate Last Admin  0.9 %  sodium chloride infusion  250 mL Intravenous PRN Marty Heck, MD       0.9 %  sodium chloride infusion  250 mL Intravenous PRN Marty Heck, MD       sodium chloride flush (NS) 0.9 % injection 3 mL  3 mL Intravenous Q12H Marty Heck, MD       sodium chloride flush (NS) 0.9 % injection 3 mL  3 mL Intravenous PRN Marty Heck, MD        Review of Systems: GENERAL: negative for malaise, night sweats HEENT: No changes in hearing or vision, no nose bleeds or other nasal problems. NECK: Negative for lumps, goiter, pain and significant neck swelling RESPIRATORY: Negative for cough, wheezing CARDIOVASCULAR: Negative for chest pain, leg swelling, palpitations, orthopnea GI: SEE HPI MUSCULOSKELETAL: Negative for joint pain or swelling, back pain, and muscle pain. SKIN: Negative for lesions, rash PSYCH: Negative for sleep disturbance, mood disorder and recent psychosocial stressors. HEMATOLOGY Negative for prolonged bleeding, bruising easily, and swollen nodes. ENDOCRINE: Negative for cold or heat intolerance, polyuria, polydipsia and goiter. NEURO: negative for tremor, gait imbalance, syncope and seizures. The remainder of the review of systems is noncontributory.   Physical Exam: BP (!) 166/75 (BP Location: Right Arm, Patient Position: Sitting, Cuff Size: Small)   Pulse 99   Temp 98.1 F (36.7 C) (Oral)   Ht 5\' 3"  (1.6 m)   Wt 151 lb (68.5 kg)   LMP 12/17/2016   BMI 26.75 kg/m  GENERAL: The patient is AO  x3, in no acute distress. Sitting in wheelchair. HEENT: Head is normocephalic and atraumatic. EOMI are intact. Mouth is well hydrated and without lesions. NECK: Supple. No masses LUNGS: Clear to auscultation. No presence of rhonchi/wheezing/rales. Adequate chest expansion HEART: RRR, normal s1 and s2. ABDOMEN: Soft, nontender, no guarding, no peritoneal signs, and nondistended. BS +. No masses. EXTREMITIES: Without any cyanosis, clubbing, rash, lesions or edema. NEUROLOGIC: AOx3, no focal motor deficit. SKIN: no jaundice, no rashes  Imaging/Labs: as above  I personally reviewed and interpreted the available labs, imaging and endoscopic files.  Impression and Plan: Melanie Hall is a 50 y.o. female with past medical history of diabetes complicated by end-stage renal disease on hemodialysis, coronary artery disease and MI status post multiple stents in March 2021 on dual antiplatelet therapy, heart failure, GERD, hypertension, stroke, who presents for follow up of chronic diarrhea.  The patient has persisted with diarrhea despite taking Imodium as needed.  Had some previous work-up that was negative for specific organic cause of her diarrhea.  We will complete this investigations with fecal elastase, check electrolytes and stool to characterize the type of diarrhea further and serum TSH.  Will reorder colonoscopy to obtain random colonic samples to rule out microscopic colitis.  As she has also presented recurrent nausea vomiting episodes, we will perform an EGD at the same time with small bowel biopsies.  She will be started on standing Colestid today and can take Imodium as needed if she persists with diarrhea episodes.  The patient understood and agreed.  -Schedule EGD and colonoscopy with small bowel and random colonic biopsies - Check stool electrolytes and fecal elastase - Check TSH  - Start colestipol 2 g qday - If persistent diarrhea, can take Imodium as needed  All questions were  answered.      Harvel Quale, MD Gastroenterology and Hepatology [facility]

## 2021-01-27 ENCOUNTER — Telehealth (INDEPENDENT_AMBULATORY_CARE_PROVIDER_SITE_OTHER): Payer: Self-pay

## 2021-01-27 ENCOUNTER — Telehealth: Payer: Self-pay | Admitting: *Deleted

## 2021-01-27 ENCOUNTER — Other Ambulatory Visit: Payer: Self-pay | Admitting: Physician Assistant

## 2021-01-27 ENCOUNTER — Telehealth (HOSPITAL_BASED_OUTPATIENT_CLINIC_OR_DEPARTMENT_OTHER): Payer: Self-pay | Admitting: *Deleted

## 2021-01-27 ENCOUNTER — Other Ambulatory Visit (INDEPENDENT_AMBULATORY_CARE_PROVIDER_SITE_OTHER): Payer: Self-pay

## 2021-01-27 DIAGNOSIS — Z01812 Encounter for preprocedural laboratory examination: Secondary | ICD-10-CM

## 2021-01-27 MED ORDER — NA SULFATE-K SULFATE-MG SULF 17.5-3.13-1.6 GM/177ML PO SOLN: 1.0000 | ORAL | 0 refills | Status: DC

## 2021-01-27 NOTE — Telephone Encounter (Signed)
Oral swab drug screen was consistent for prescribed medications.  ?

## 2021-01-27 NOTE — Telephone Encounter (Signed)
   [facility] Pre-operative Risk Assessment    Patient Name: DONDA FRIEDLI  DOB: August 14, 1970 MRN: [id number]  HEARTCARE STAFF:  - IMPORTANT!!!!!! Under Visit Info/Reason for Call, type in Other and utilize the format Clearance MM/DD/YY or Clearance TBD. Do not use dashes or single digits. - Please review there is not already an duplicate clearance open for this procedure. - If request is for dental extraction, please clarify the # of teeth to be extracted. - If the patient is currently at the dentist's office, call Pre-Op Callback Staff (MA/nurse) to input urgent request.  - If the patient is not currently in the dentist office, please route to the Pre-Op pool.  Request for surgical clearance:  What type of surgery is being performed?  COLONOSCOPY & UPPER ENDOCSOPY  When is this surgery scheduled?  TBD  What type of clearance is required (medical clearance vs. Pharmacy clearance to hold med vs. Both)?  BOTH  Are there any medications that need to be held prior to surgery and how long?  BRILINTA X'S 5 DAYS  Practice name and name of physician performing surgery?  [facility] / DR. CASTANEDA  What is the office phone number?  [phone]   7.   What is the office fax number?  [phone]  8.   Anesthesia type (None, local, MAC, general) ?  MAC   Jeanann Lewandowsky 01/27/2021, 1:19 PM  _________________________________________________________________   (provider comments below)

## 2021-01-27 NOTE — Telephone Encounter (Signed)
LeighAnn Josedejesus Marcum, CMA  

## 2021-01-30 ENCOUNTER — Other Ambulatory Visit: Payer: Self-pay

## 2021-01-30 DIAGNOSIS — E039 Hypothyroidism, unspecified: Secondary | ICD-10-CM

## 2021-01-30 MED ORDER — LEVOTHYROXINE SODIUM 25 MCG PO TABS: 25.0000 ug | ORAL_TABLET | Freq: Every day | ORAL | 1 refills | Status: DC

## 2021-01-30 NOTE — Telephone Encounter (Signed)
Pt has appt 02/07/21 with Richardson Dopp, PAC. Will add pre op clearance needed to appt notes. Will send clearance notes to [facility] for upcoming appt. Will send FYI to requesting office pt has appt 02/07/21.

## 2021-01-30 NOTE — Telephone Encounter (Signed)
Thanks, looking forward to get the clearance note from your consultation. Thanks

## 2021-01-30 NOTE — Telephone Encounter (Signed)
Primary [place], MD  Chart reviewed as part of pre-operative protocol coverage. Because of Melanie Hall's past medical history and time since last visit, he/she will require a follow-up visit in order to better assess preoperative cardiovascular risk.  Pre-op covering staff: - Please schedule appointment and call patient to inform them. - Please contact requesting surgeon's office via preferred method (i.e, phone, fax) to inform them of need for appointment prior to surgery.  If applicable, this message will also be routed to pharmacy pool and/or primary cardiologist for input on holding anticoagulant/antiplatelet agent as requested below so that this information is available at time of patient's appointment.   Deberah Pelton, NP  01/30/2021, 3:23 PM

## 2021-01-31 ENCOUNTER — Encounter (HOSPITAL_COMMUNITY): Payer: Self-pay | Admitting: Physical Therapy

## 2021-01-31 ENCOUNTER — Encounter (INDEPENDENT_AMBULATORY_CARE_PROVIDER_SITE_OTHER): Payer: Self-pay

## 2021-01-31 ENCOUNTER — Telehealth: Payer: Self-pay | Admitting: Orthopedic Surgery

## 2021-01-31 ENCOUNTER — Other Ambulatory Visit: Payer: Self-pay

## 2021-01-31 ENCOUNTER — Ambulatory Visit (HOSPITAL_COMMUNITY): Payer: Medicaid Other | Admitting: Physical Therapy

## 2021-01-31 DIAGNOSIS — R2689 Other abnormalities of gait and mobility: Secondary | ICD-10-CM

## 2021-01-31 DIAGNOSIS — M546 Pain in thoracic spine: Secondary | ICD-10-CM

## 2021-01-31 DIAGNOSIS — R262 Difficulty in walking, not elsewhere classified: Secondary | ICD-10-CM

## 2021-01-31 NOTE — Telephone Encounter (Signed)
I called and lm on vm to advise pt that this has been done and gave number to hanger and she can call and make appt.

## 2021-01-31 NOTE — Telephone Encounter (Signed)
Yes. I e-mailed to [facility] on 7/15

## 2021-01-31 NOTE — Therapy (Signed)
[facility] [address] [place], [place], [address] Phone: [phone]   Fax:  [phone]  Physical Therapy Treatment  Patient Details  Name: Melanie Hall MRN: [id number] Date of Birth: 05-19-1971 Referring Provider (PT): Alysia Penna MD  PHYSICAL THERAPY DISCHARGE SUMMARY  Visits from Start of Care: 9  Current functional level related to goals / functional outcomes: See below    Remaining deficits see below    Education / Equipment: See assessment    Patient agrees to discharge. Patient goals were partially met. Patient is being discharged due to being pleased with the current functional level.  Encounter Date: 01/31/2021   PT End of Session - 01/31/21 1306     Visit Number 9    Number of Visits 12    Date for PT Re-Evaluation 01/31/21    Authorization Type Medicaid    Authorization Time Period 10 visits 01/09/21-02/12/21    Authorization - Visit Number 6    Authorization - Number of Visits 10    Progress Note Due on Visit 12    PT Start Time 1305    PT Stop Time 1345    PT Time Calculation (min) 40 min    Activity Tolerance Patient tolerated treatment well    Behavior During Therapy [facility] for tasks assessed/performed             Past Medical History:  Diagnosis Date   Acute cystitis without hematuria    Acute midline thoracic back pain 0/97/3532   Acute systolic heart failure ([place]) 10/21/2019   Anemia    Arthritis    Cataracts, bilateral    surgery to remove   CATARACTS, BILATERAL 07/02/2007   Qualifier: Diagnosis of  By: Isla Pence     Closed fracture of left femur ([place]) 10/28/2018   Closed fracture of right ankle 11/06/2017   Diabetes mellitus    type 2   Emphysematous cystitis 08/26/2018   Encounter for gynecological examination with Papanicolaou smear of cervix 01/14/2020   Encounter for screening fecal occult blood testing 01/14/2020   Encounter for screening for malignant neoplasm of cervix 12/29/2019    Encounter for screening mammogram for malignant neoplasm of breast 12/29/2019   ESRD on hemodialysis ([place])    MWF - in [place]   GERD (gastroesophageal reflux disease)    Hyperlipidemia    Hypertension    Hypokalemia 08/26/2018   IRREGULAR MENSES 09/14/2009   Qualifier: Diagnosis of  By: Hassell Done FNP, [place]     Loose stools 11/12/2019   Normocytic anemia 08/26/2018   PARONYCHIA, RIGHT GREAT TOE 07/30/2008   Qualifier: Diagnosis of  By: Hassell Done FNP, [place]     Pressure ulcer 09/10/2019   Right arm weakness 08/08/2019   Sprain of left ankle    STEMI (ST elevation myocardial infarction) ([place]) 10/18/2019   STEMI involving right coronary artery ([place]) 10/18/2019   Stroke ([place]) 10/2019   SVD (spontaneous vaginal delivery)    x 4   Vaginosis 08/26/2018   Weakness 09/10/2019   Weakness of both lower extremities     Past Surgical History:  Procedure Laterality Date   A/V FISTULAGRAM Left 01/12/2020   Procedure: A/V FISTULAGRAM;  Surgeon: Serafina Mitchell, MD;  Location: [facility];  Service: Cardiovascular;  Laterality: Left;   A/V FISTULAGRAM N/A 03/17/2020   Procedure: A/V FISTULAGRAM - Left Arm;  Surgeon: Marty Heck, MD;  Location: [facility];  Service: Cardiovascular;  Laterality: N/A;   A/V FISTULAGRAM  Left 06/23/2020   Procedure: A/V FISTULAGRAM;  Surgeon: Marty Heck, MD;  Location: [facility];  Service: Cardiovascular;  Laterality: Left;   ANKLE FUSION Left 06/01/2020   Procedure: LEFT ANKLE FUSION;  Surgeon: Newt Minion, MD;  Location: [facility];  Service: Orthopedics;  Laterality: Left;   AV FISTULA PLACEMENT Left 08/17/2019   Procedure: LEFT BRACHIAL CEPHALIC ARTERIOVENOUS (AV) FISTULA;  Surgeon: Angelia Mould, MD;  Location: [facility];  Service: Vascular;  Laterality: Left;   CORONARY STENT INTERVENTION N/A 10/18/2019   Procedure: CORONARY STENT INTERVENTION;  Surgeon: Sherren Mocha, MD;  Location: [facility];  Service:  Cardiovascular;  Laterality: N/A;   CORONARY/GRAFT ACUTE MI REVASCULARIZATION N/A 10/18/2019   Procedure: Coronary/Graft Acute MI Revascularization;  Surgeon: Sherren Mocha, MD;  Location: [facility];  Service: Cardiovascular;  Laterality: N/A;   EYE SURGERY Bilateral    cataracts removed   FEMUR IM NAIL Left 10/28/2018   Procedure: RETROGRADE FEMORAL NAILING;  Surgeon: Meredith Pel, MD;  Location: [facility];  Service: Orthopedics;  Laterality: Left;   IM NAILING FEMORAL SHAFT RETROGRADE Left 10/28/2018   INTRAVASCULAR ULTRASOUND/IVUS N/A 10/18/2019   Procedure: Intravascular Ultrasound/IVUS;  Surgeon: Sherren Mocha, MD;  Location: [facility];  Service: Cardiovascular;  Laterality: N/A;   IR FLUORO GUIDE CV LINE RIGHT  08/11/2019   IR THORACENTESIS ASP PLEURAL SPACE W/IMG GUIDE  11/12/2019   IR US GUIDE VASC ACCESS RIGHT  08/11/2019   KNEE SURGERY Left    LEFT HEART CATH AND CORONARY ANGIOGRAPHY N/A 10/18/2019   Procedure: LEFT HEART CATH AND CORONARY ANGIOGRAPHY;  Surgeon: Sherren Mocha, MD;  Location: [facility];  Service: Cardiovascular;  Laterality: N/A;   PERIPHERAL VASCULAR BALLOON ANGIOPLASTY Left 01/12/2020   Procedure: PERIPHERAL VASCULAR BALLOON ANGIOPLASTY;  Surgeon: Serafina Mitchell, MD;  Location: [facility];  Service: Cardiovascular;  Laterality: Left;  AVF   PERIPHERAL VASCULAR BALLOON ANGIOPLASTY Left 03/17/2020   Procedure: PERIPHERAL VASCULAR BALLOON ANGIOPLASTY;  Surgeon: Marty Heck, MD;  Location: [facility];  Service: Cardiovascular;  Laterality: Left;  AVF   PERIPHERAL VASCULAR BALLOON ANGIOPLASTY Left 05/05/2020   Procedure: PERIPHERAL VASCULAR BALLOON ANGIOPLASTY;  Surgeon: Marty Heck, MD;  Location: [facility];  Service: Cardiovascular;  Laterality: Left;  arm fistula   RADIOLOGY WITH ANESTHESIA N/A 09/15/2019   Procedure: [facility] AND LUMBER LOWER BACK PAIN;  Surgeon: Radiologist, Medication, MD;  Location:  [facility];  Service: Radiology;  Laterality: N/A;   TUBAL LIGATION      There were no vitals filed for this visit.   Subjective Assessment - 01/31/21 1318     Subjective Patient says her back is doing much better. Not as much pain. "Once in a blue moon". Reports 90% improvement since starting therapy. Exercises and stretches help when having pain.    Limitations Standing;Walking;House hold activities    How long can you stand comfortably? 5 minutes    How long can you walk comfortably? 10 minutes    Currently in Pain? No/denies    Pain Onset More than a month ago                [facility] PT Assessment - 01/31/21 0001       Assessment   Medical Diagnosis Mid back pain    Referring Provider (PT) Alysia Penna MD    Onset Date/Surgical Date 10/17/20      Balance Screen  Has the patient fallen in the past 6 months No      [place] residence    Living Arrangements Non-relatives/Friends      Prior Function   Level of Independence Needs assistance with ADLs      Cognition   Overall Cognitive Status Within Functional Limits for tasks assessed      Strength   Right Hip Flexion 4+/5   was 4-   Left Hip Flexion 4/5   same   Right Knee Extension 4+/5   was 3+   Left Knee Extension 4/5   was 3+   Right Ankle Dorsiflexion 3-/5    Left Ankle Dorsiflexion 3-/5      Transfers   Five time sit to stand comments  19.2 seconds with use of UEs for controlled descent   was 21.3 sec with UEs     Ambulation/Gait   Ambulation/Gait Yes    Ambulation/Gait Assistance 6: Modified independent (Device/Increase time)    Ambulation Distance (Feet) 200 Feet    Assistive device Rollator    Gait Pattern Decreased step length - right;Decreased step length - left;Decreased stance time - left;Decreased stride length;Trunk flexed   hyper extends LT knee   Ambulation Surface Level;Indoor    Gait Comments 2MWT                                      PT Short Term Goals - 01/31/21 1333       PT SHORT TERM GOAL #1   Title Patient will be independent with initial HEP and self-management strategies to improve functional outcomes    Time 3    Period Weeks    Status Achieved    Target Date 01/10/21      PT SHORT TERM GOAL #2   Title Patient will be able to ambulate at least 175 feet during 2MWT with LRAD to demonstrate improved ability to perform functional mobility and associated tasks.    Baseline current 200 with rollator    Time 3    Period Weeks    Status Achieved    Target Date 01/10/21               PT Long Term Goals - 01/31/21 1334       PT LONG TERM GOAL #1   Title Patient will be able to ambulate at least 250 feet during 2MWT with LRAD to demonstrate improved ability to perform functional mobility and associated tasks.    Baseline current 200 feet with rollator    Time 6    Period Weeks    Status Not Met      PT LONG TERM GOAL #2   Title Patient will be able to perform stand x 5 in < 12 seconds to demonstrate improvement in functional mobility and reduced risk for falls.    Baseline current 19.2 sec with heavy use of UEs    Time 6    Period Weeks    Status Not Met      PT LONG TERM GOAL #3   Title Patient will report at least 65% overall improvement in subjective complaint to indicate improvement in ability to perform ADLs.    Baseline Reports 90% improved    Time 6    Period Weeks    Status Achieved  Plan - 01/31/21 1346     Clinical Impression Statement Patient has made good progress. Shows improved LE strength and ambulatory distance. Patient remains limited by LE weakness and altered biomechanics which continues to negatively impact functional mobility. Patient reports significant improvement in subjective complaint of back pain which is current referral. At this time patient is likely at max functional level until  structure of LLE further addressed. Encouraged patient to follow up with PCP and orthotic company. Reviewed HEP and answered all patient questions. Patient encouraged to follow up with therapy services with any further questions or concerns.    Examination-Activity Limitations Transfers;Locomotion Level    Examination-Participation Restrictions Community Activity;Other    Stability/Clinical Decision Making Stable/Uncomplicated    Rehab Potential Fair    PT Treatment/Interventions ADLs/Self Care Home Management;Aquatic Therapy;Stair training;Biofeedback;Ultrasound;Traction;Functional mobility training;Therapeutic activities;Neuromuscular re-education;Balance training;Scar mobilization;Passive range of motion;Visual/perceptual remediation/compensation;Dry needling;Manual techniques;Patient/family education;Manual lymph drainage;Energy conservation;Spinal Manipulations;Splinting;Taping;Joint Manipulations;Vasopneumatic Device;Compression bandaging;Orthotic Fit/Training;Therapeutic exercise;Fluidtherapy;Contrast Bath;Cryotherapy;Electrical Stimulation;Parrafin;Iontophoresis 86m/ml Dexamethasone;DME Instruction;Gait training;Moist Heat    PT Next Visit Plan DC to HEP    PT Home Exercise Plan Eval: seated march, heel/ toe raise, scapular retraction 6/30:  towel stretch for gastroc 7/7/ bridge, ab set, ab march 7/14 ab march, clamshell 7/21 sit/stand, heel raises    Consulted and Agree with Plan of Care Patient             Patient will benefit from skilled therapeutic intervention in order to improve the following deficits and impairments:  Abnormal gait, Decreased endurance, Decreased activity tolerance, Decreased balance, Decreased mobility, Difficulty walking, Decreased range of motion, Pain, Decreased strength  Visit Diagnosis: Pain in thoracic spine  Other abnormalities of gait and mobility  Difficulty in walking, not elsewhere classified     Problem List Patient Active Problem List    Diagnosis Date Noted   Pain and tenderness 11/10/2020   Non-intractable vomiting 06/16/2020   Encounter for support and coordination of transition of care 06/16/2020   S/P ankle fusion 06/01/2020   Pilon fracture of left tibia, closed, initial encounter    Displaced trimalleolar fracture of left lower leg, initial encounter for closed fracture    Abnormality of gait and mobility 05/26/2020   Migraine 05/26/2020   Diabetic retinopathy ([place] 05/04/2020   Chronic diarrhea of unknown origin 01/14/2020   Type 2 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, with long-term current use of insulin ([place] 12/29/2019   Hypothyroidism 12/29/2019   Weakness of both lower extremities 11/12/2019   Debility 09/10/2019   ESRD (end stage renal disease) on dialysis ([place] 09/10/2019   Depression 09/10/2019   Incontinence in female 09/10/2019   Anemia in chronic kidney disease 08/19/2019   MGUS (monoclonal gammopathy of unknown significance) 07/23/2019   GERD (gastroesophageal reflux disease) 02/28/2019   Essential hypertension 10/28/2018   Irregular menses 09/14/2009   Hypercholesterolemia 11/19/2008   Type 2 diabetes with nephropathy ([place] 07/02/2007   1:48 PM, 01/31/21 CJosue HectorPT DPT  Physical Therap[facility] [phone] [place] [address]Phone: [phone]  Fax:  [phone] Name: SMARIAELENA CADEMRN: [id number] of Birth: [birth year]

## 2021-01-31 NOTE — Telephone Encounter (Signed)
Pt called about a prescription that was suppose to be sent to [facility] about a boot or shoe for her ankle.   CB [phone]

## 2021-01-31 NOTE — Telephone Encounter (Signed)
It looks like Junie Panning wrote this RX 2 weeks ago. Do you still have it?

## 2021-02-02 ENCOUNTER — Ambulatory Visit (HOSPITAL_COMMUNITY): Payer: Medicaid Other | Admitting: Physical Therapy

## 2021-02-03 LAB — TSH: TSH: 3.28 mIU/L

## 2021-02-06 NOTE — Progress Notes (Addendum)
Cardiology Office Note:    Date:  02/07/2021   ID:  EDGAR CORRIGAN, DOB [birth year], MRN [id number]  PCP:  Noreene Larsson, NP   [facility] Providers Cardiologist:  Sherren Mocha, MD Cardiology APP:  Sharmon Revere      Referring MD: Perlie Mayo, NP   Chief Complaint:  Surgical Clearance    Patient Profile:    Melanie Hall is a 50 y.o. female with:  Coronary artery disease S/p late presenting Inf STEMI 10/2019 >> PCI: DES x 2 to RCA Chronic systolic CHF Ischemic cardiomyopathy Echocardiogram 10/19/2019: EF 40-45, inf WMA, Gr 1 DD, Lg L pl Eff R Pleural Eff >> s/p tap (-1.3 L) 11/2019 ESRD Diabetes mellitus Hypertension Hyperlipidemia    Prior CV studies: Limited Echocardiogram 10/19/2019 EF 40-45, inferior septum, inferior wall and posterior wall hypokinetic, mild conc LVH, Gr 1 DD, Large L pleural Eff, mild MR   Cardiac catheterization 10/18/2019 LAD irregularities LCx irregularities RCA mid 50, distal 95 (thrombotic) Inferior scar, EF 35, moderate MR PCI: 3.5 x 18 mm Resolute Onyx DES x2 to the RCA   Echocardiogram 10/13/2019 EF 60-65, normal RV SF, moderately elevated PASP (RVSP 43.9), trivial MR   Echocardiogram 08/14/2019 Small pericardial effusion without tamponade physiology, EF 55-60, moderate LVH, RVSP 32.9   Echocardiogram 08/09/2019 EF 65-70   History of Present Illness: Melanie Hall was admitted in 10/2019 with a late presentation inferior STEMI that was tx with DES x 2 to the RCA.  EF was 40-45.  Post DC, she underwent thoracentesis for a large R pleural effusion.   She was last seen in clinic in 6/21.  She returns for surgical clearance.  She needs a colonoscopy and EGD.   Ticagrelor needs to be held for 5 days.    She is here alone.  Over the past several weeks, she has noted occasional chest pain.  This is left-sided and sharp.  It lasts about 1 to 2 minutes.  She does not take nitroglycerin for this.  It does not remind her of her previous angina.     She has not had significant shortness of breath.  She has not had syncope.  She has chronic left ankle swelling from an old injury.  She sleeps on an incline.  She has chronic epigastric and chest discomfort with laying flat.    Past Medical History:  Diagnosis Date   Acute cystitis without hematuria    Acute midline thoracic back pain 11/02/621   Acute systolic heart failure ([place]) 10/21/2019   Anemia    Arthritis    Cataracts, bilateral    surgery to remove   CATARACTS, BILATERAL 07/02/2007   Qualifier: Diagnosis of  By: Isla Pence     Closed fracture of left femur ([place]) 10/28/2018   Closed fracture of right ankle 11/06/2017   Diabetes mellitus    type 2   Emphysematous cystitis 08/26/2018   Encounter for gynecological examination with Papanicolaou smear of cervix 01/14/2020   Encounter for screening fecal occult blood testing 01/14/2020   Encounter for screening for malignant neoplasm of cervix 12/29/2019   Encounter for screening mammogram for malignant neoplasm of breast 12/29/2019   ESRD on hemodialysis ([place])    MWF - in [place]   GERD (gastroesophageal reflux disease)    Hyperlipidemia    Hypertension    Hypokalemia 08/26/2018   IRREGULAR MENSES 09/14/2009   Qualifier: Diagnosis of  By: Hassell Done FNP, [place]     Loose stools 11/12/2019  Normocytic anemia 08/26/2018   PARONYCHIA, RIGHT GREAT TOE 07/30/2008   Qualifier: Diagnosis of  By: Hassell Done FNP, Nykedtra     Pressure ulcer 09/10/2019   Right arm weakness 08/08/2019   Sprain of left ankle    STEMI (ST elevation myocardial infarction) ([place]) 10/18/2019   STEMI involving right coronary artery ([place]) 10/18/2019   Stroke ([place]) 10/2019   SVD (spontaneous vaginal delivery)    x 4   Vaginosis 08/26/2018   Weakness 09/10/2019   Weakness of both lower extremities     Current Medications: Current Meds  Medication Sig   aspirin 81 MG EC tablet Take 1 tablet (81 mg total) by mouth daily.   atorvastatin (LIPITOR) 80 MG tablet Take 1  tablet (80 mg total) by mouth daily at 6 PM.   BRILINTA 90 MG TABS tablet Take 1 tablet by mouth twice daily   colestipol (COLESTID) 1 g tablet Take 1 tablet (1 g total) by mouth 2 (two) times daily.   epoetin alfa (EPOGEN) 3000 UNIT/ML injection Inject 3,000 Units into the skin every 14 (fourteen) days.    gabapentin (NEURONTIN) 300 MG capsule Take 1 capsule (300 mg total) by mouth 3 (three) times daily.   hydrALAZINE (APRESOLINE) 10 MG tablet Take 1 tablet (10 mg total) by mouth in the morning and at bedtime.   insulin aspart protamine- aspart (NOVOLOG MIX 70/30) (70-30) 100 UNIT/ML injection Inject 0.1 mLs (10 Units total) into the skin 2 (two) times daily with a meal.   isosorbide mononitrate (IMDUR) 30 MG 24 hr tablet Take 0.5 tablets (15 mg total) by mouth daily.   levothyroxine (SYNTHROID) 25 MCG tablet Take 1 tablet (25 mcg total) by mouth daily before breakfast.   lidocaine-prilocaine (EMLA) cream Apply topically.   methocarbamol (ROBAXIN) 500 MG tablet Take 1 tablet (500 mg total) by mouth every 8 (eight) hours as needed for muscle spasms.   multivitamin (RENA-VIT) TABS tablet Take 1 tablet by mouth at bedtime.   Na Sulfate-K Sulfate-Mg Sulf (SUPREP BOWEL PREP KIT) 17.5-3.13-1.6 GM/177ML SOLN Take 1 kit by mouth as directed.   nitroGLYCERIN (NITROSTAT) 0.4 MG SL tablet Place 1 tablet (0.4 mg total) under the tongue every 5 (five) minutes x 3 doses as needed for chest pain.   ondansetron (ZOFRAN ODT) 4 MG disintegrating tablet Take 1 tablet (4 mg total) by mouth every 8 (eight) hours as needed for nausea or vomiting.   silver sulfADIAZINE (SILVADENE) 1 % cream Apply 1 application topically daily.   tamsulosin (FLOMAX) 0.4 MG CAPS capsule Take 1 capsule (0.4 mg total) by mouth daily after supper.   traMADol (ULTRAM) 50 MG tablet Take 1 tablet (50 mg total) by mouth daily as needed.   [DISCONTINUED] carvedilol (COREG) 6.25 MG tablet Take 1.5 tablets (9.375 mg total) by mouth 2 (two) times  daily.   Current Facility-Administered Medications for the 02/07/21 encounter (Office Visit) with Liliane Shi, PA-C  Medication   0.9 %  sodium chloride infusion   0.9 %  sodium chloride infusion   sodium chloride flush (NS) 0.9 % injection 3 mL   sodium chloride flush (NS) 0.9 % injection 3 mL     Allergies:   Contrast media [iodinated diagnostic agents] and Penicillins   Social History   Tobacco Use   Smoking status: Former    Packs/day: 0.25    Years: 2.00    Pack years: 0.50    Types: Cigarettes    Quit date: 1997    Years since  quitting: 25.6   Smokeless tobacco: Never  Vaping Use   Vaping Use: Never used  Substance Use Topics   Alcohol use: No   Drug use: No     Family Hx: The patient's family history includes Bipolar disorder in her mother; Brain cancer in her maternal aunt; Breast cancer in her maternal aunt; Depression in her father; Diabetes in her maternal grandmother, mother, and sister; Heart disease in her maternal grandmother; Hypertension in her mother.  Review of Systems  Gastrointestinal:  Positive for diarrhea and vomiting. Negative for hematochezia.  Genitourinary:  Negative for hematuria.    EKGs/Labs/Other Test Reviewed:    EKG:  EKG is ordered today.  The ekg ordered today demonstrates normal sinus rhythm, heart rate 92, normal axis, septal Q waves, nonspecific ST-T wave changes, QTC 437.  Since last tracing, T wave inversions have resolved.  Recent Labs: 11/10/2020: ALT 8; BUN 20; Creatinine, Ser 4.05; Hemoglobin 10.4; Platelets 212; Potassium 4.1; Sodium 134 02/02/2021: TSH 3.28   Recent Lipid Panel Lab Results  Component Value Date/Time   CHOL 94 (L) 12/15/2019 09:41 AM   TRIG 94 12/15/2019 09:41 AM   HDL 35 (L) 12/15/2019 09:41 AM   LDLCALC 41 12/15/2019 09:41 AM      Risk Assessment/Calculations:      Physical Exam:    VS:  BP (!) 160/80   Pulse 92   Ht '5\' 3"'  (1.6 m)   Wt 162 lb 12.8 oz (73.8 kg)   LMP 12/17/2016   SpO2 96%    BMI 28.84 kg/m     Wt Readings from Last 3 Encounters:  02/07/21 162 lb 12.8 oz (73.8 kg)  01/26/21 151 lb (68.5 kg)  01/10/21 147 lb (66.7 kg)     Constitutional:      Appearance: Healthy appearance. Not in distress.  Neck:     Vascular: JVD normal.  Pulmonary:     Effort: Pulmonary effort is normal.     Breath sounds: No wheezing. No rales.  Cardiovascular:     Normal rate. Regular rhythm. Normal S1. Normal S2.      Murmurs:  Venous hum from AV fistula noted but no significant murmur  Edema:    Peripheral edema present.    Ankle: 1+ edema of the left ankle. Abdominal:     Palpations: Abdomen is soft. There is no hepatomegaly.  Skin:    General: Skin is warm and dry.  Neurological:     Mental Status: Alert and oriented to person, place and time.     Cranial Nerves: Cranial nerves are intact.       ASSESSMENT & PLAN:    1. Preoperative cardiovascular examination 2. Coronary artery disease involving native coronary artery of native heart with angina pectoris [facility]) History of late presenting inferior STEMI in 10/2019 treated with DES x2 to the RCA.  She really had no significant disease elsewhere.  She presents for surgical clearance today.  She needs a colonoscopy and EGD which are low risk procedures.  However, her perioperative risk of major cardiac event is significantly elevated at 11% according to the revised cardiac risk index (RCRI).  She is fairly sedentary and cannot achieve 4 METs.  She is experiencing occasional chest discomfort that largely sounds noncardiac.  However, given her significant risk for perioperative CV event and current chest symptoms, I think it is best to proceed with stress testing before clearing her.  I will arrange a Lexiscan Myoview.  As long as her stress test is  low risk, she can discontinue ticagrelor as it has been greater than 1 year since her event.  I have asked her to remain on ticagrelor until we can get her stress test completed.   Continue aspirin, atorvastatin, carvedilol.  Follow-up in 3 months.  3. ESRD (end stage renal disease) on dialysis [facility]) She remains on dialysis every Monday, Wednesday, Friday.  4. HFmrEF (heart failure with mildly reduced EF) EF 45 by echocardiogram in 4/21.  She is not on ACE/ARB/ARNI secondary to ESRD.  Continue hydralazine, isosorbide, carvedilol.  Volume management per dialysis.  5. Essential hypertension Blood pressure above target.  Increase carvedilol to 12.5 mg twice daily.  6. Mixed hyperlipidemia Continue high intensity statin therapy.  Arrange fasting CMET, lipids at the time of her stress test.     Shared Decision Making/Informed Consent The risks [chest pain, shortness of breath, cardiac arrhythmias, dizziness, blood pressure fluctuations, myocardial infarction, stroke/transient ischemic attack, nausea, vomiting, allergic reaction, radiation exposure, metallic taste sensation and life-threatening complications (estimated to be 1 in 10,000)], benefits (risk stratification, diagnosing coronary artery disease, treatment guidance) and alternatives of a nuclear stress test were discussed in detail with Melanie Hall and she agrees to proceed.    Dispo:  Return in about 3 months (around 05/10/2021) for Routine Follow Up, w/ Dr. Burt Knack, or Richardson Dopp, PA-C.   Medication Adjustments/Labs and Tests Ordered: Current medicines are reviewed at length with the patient today.  Concerns regarding medicines are outlined above.  Tests Ordered: Orders Placed This Encounter  Procedures   Comp Met (CMET)   Lipid Profile   Cardiac Stress Test: Informed Consent Details: Physician/Practitioner Attestation; Transcribe to consent form and obtain patient signature   Myocardial Perfusion Imaging   Medication Changes: Meds ordered this encounter  Medications   carvedilol (COREG) 12.5 MG tablet    Sig: Take 1 tablet (12.5 mg total) by mouth 2 (two) times daily.    Dispense:  180 tablet    Refill:   3    Signed, Richardson Dopp, PA-C  02/07/2021 11:34 AM    [facility] [place], [place], [place]  [address] Phone: [phone]; Fax: [phone]   ADDENDUM GATED SPECT MYO PERF W/LEXISCAN STRESS 1D 02/16/2021  Narrative  Nuclear stress EF: 54%.  The left ventricular ejection fraction is mildly decreased (45-54%).  There was no ST segment deviation noted during stress.  The study is normal.  This is a low risk study.  Normal resting and stress perfusion. No ischemia or infarction EF 54%   RECOMMENDATIONS: The patient may proceed with colo/EGD at acceptable risk given low risk Myoview.   She has been instructed to stop Brilinta and remain on ASA. Richardson Dopp, PA-C    02/17/2021 7:30 AM

## 2021-02-07 ENCOUNTER — Ambulatory Visit (INDEPENDENT_AMBULATORY_CARE_PROVIDER_SITE_OTHER): Payer: Medicaid Other | Admitting: Physician Assistant

## 2021-02-07 ENCOUNTER — Encounter: Payer: Self-pay | Admitting: Physician Assistant

## 2021-02-07 ENCOUNTER — Other Ambulatory Visit: Payer: Self-pay

## 2021-02-07 VITALS — BP 160/80 | HR 92 | Ht 63.0 in | Wt 162.8 lb

## 2021-02-07 DIAGNOSIS — N186 End stage renal disease: Secondary | ICD-10-CM

## 2021-02-07 DIAGNOSIS — E782 Mixed hyperlipidemia: Secondary | ICD-10-CM

## 2021-02-07 DIAGNOSIS — I2510 Atherosclerotic heart disease of native coronary artery without angina pectoris: Secondary | ICD-10-CM

## 2021-02-07 DIAGNOSIS — I5020 Unspecified systolic (congestive) heart failure: Secondary | ICD-10-CM | POA: Diagnosis not present

## 2021-02-07 DIAGNOSIS — I25119 Atherosclerotic heart disease of native coronary artery with unspecified angina pectoris: Secondary | ICD-10-CM

## 2021-02-07 DIAGNOSIS — Z01810 Encounter for preprocedural cardiovascular examination: Secondary | ICD-10-CM | POA: Diagnosis not present

## 2021-02-07 DIAGNOSIS — I10 Essential (primary) hypertension: Secondary | ICD-10-CM

## 2021-02-07 DIAGNOSIS — Z992 Dependence on renal dialysis: Secondary | ICD-10-CM

## 2021-02-07 DIAGNOSIS — E1121 Type 2 diabetes mellitus with diabetic nephropathy: Secondary | ICD-10-CM

## 2021-02-07 MED ORDER — CARVEDILOL 12.5 MG PO TABS: 12.5000 mg | ORAL_TABLET | Freq: Two times a day (BID) | ORAL | 3 refills | Status: DC

## 2021-02-07 NOTE — Patient Instructions (Signed)
Medication Instructions:   INCREASE Coreg one tablet by mouth ( 12.5 mg) twice daily.  *If you need a refill on your cardiac medications before your next appointment, please call your pharmacy*   Lab Work: Your physician recommends that you return for a FASTING lipid profile/cmet on Aug 11 same day as stress test fasting from midnight the night before.   If you have labs (blood work) drawn today and your tests are completely normal, you will receive your results only by: [place] (if you have MyChart) OR A paper copy in the mail If you have any lab test that is abnormal or we need to change your treatment, we will call you to review the results.   Testing/Procedures: You are scheduled for a Myocardial Perfusion Imaging Study on Thursday, August 11 at 8:00 am. .   Please arrive 15 minutes prior to your appointment time for registration and insurance purposes.   The test will take approximately 3 to 4 hours to complete; you may bring reading material. If someone comes with you to your appointment, they will need to remain in the main lobby due to limited space in the testing area.   How to prepare for your Myocardial Perfusion test:   Do not eat or drink 3 hours prior to your test, except you may have water.    Do not consume products containing caffeine (regular or decaffeinated) 12 hours prior to your test (ex: coffee, chocolate, soda, tea)   Do bring a list of your current medications with you. If not listed below, you may take your medications as normal.   Bring any held medication to your appointment, as you may be required to take it once the test is complete.   Do wear comfortable clothes (no dresses or overalls) and walking shoes. Tennis shoes are preferred. No heels or open toed shoes.  Do not wear perfume, or lotions (deodorant is allowed).   If these instructions are not followed, you test will have to be rescheduled.   Please report to [address]  [address] for your test. If you have questions or concerns about your appointment, please call the Nuclear Lab at [phone].  If you cannot keep your appointment, please provide 24 hour notification to the Nuclear lab to avoid a possible $50 charge to your account.    Follow-Up: At [facility], you and your health needs are our priority.  As part of our continuing mission to provide you with exceptional heart care, we have created designated Provider Care Teams.  These Care Teams include your primary Cardiologist (physician) and Advanced Practice Providers (APPs -  Physician Assistants and Nurse Practitioners) who all work together to provide you with the care you need, when you need it.  We recommend signing up for the patient portal called "MyChart".  Sign up information is provided on this After Visit Summary.  MyChart is used to connect with patients for Virtual Visits (Telemedicine).  Patients are able to view lab/test results, encounter notes, upcoming appointments, etc.  Non-urgent messages can be sent to your provider as well.   To learn more about what you can do with MyChart, go to [email or web address].    Your next appointment:   3 month(s) with Richardson Dopp, PA-C on Tuesday, November 22 @ 10:15 am.   The format for your next appointment:   In Person  Provider:   Richardson Dopp, PA-C   Other Instructions If your stress test is normal your last dose  of Brilinta prior to your surgery will be August 10.

## 2021-02-09 ENCOUNTER — Telehealth: Payer: Self-pay

## 2021-02-09 NOTE — Telephone Encounter (Signed)
Detailed instructions left on the patient's answering machine. Asked to call back with any questions. S.Angelo Prindle EMTP 

## 2021-02-10 NOTE — Addendum Note (Signed)
Addended by: Briant Cedar on: 02/10/2021 11:33 AM   Modules accepted: Orders

## 2021-02-10 NOTE — Patient Instructions (Signed)
Melanie Hall  02/10/2021     @PREFPERIOPPHARMACY @   Your procedure is scheduled on  02/21/2021.   Report to [facility] at  [phone]  A.M.   Call this number if you have problems the morning of surgery:  [phone]   Remember:  Follow the diet and prep instructions given to you by the office.    Take these medicines the morning of surgery with A SIP OF WATER        carvedilol, gabapentin, isosorbide, levothyroxine, robaxin, zofran (if needed), tramadol (if needed)     Do not wear jewelry, make-up or nail polish.  Do not wear lotions, powders, or perfumes, or deodorant.  Do not shave 48 hours prior to surgery.  Men may shave face and neck.  Do not bring valuables to the hospital.  [facility] is not responsible for any belongings or valuables.  Contacts, dentures or bridgework may not be worn into surgery.  Leave your suitcase in the car.  After surgery it may be brought to your room.  For patients admitted to the hospital, discharge time will be determined by your treatment team.  Patients discharged the day of surgery will not be allowed to drive home and must have someone with them for 24 hours.    Special instructions:   DO NOT smoke tobacco or vape for 24 hours before your procedure.  Please read over the following fact sheets that you were given. Anesthesia Post-op Instructions and Care and Recovery After Surgery      Upper Endoscopy, Adult, Care After This sheet gives you information about how to care for yourself after your procedure. Your health care provider may also give you more specific instructions. If you have problems or questions, contact your health careprovider. What can I expect after the procedure? After the procedure, it is common to have: A sore throat. Mild stomach pain or discomfort. Bloating. Nausea. Follow these instructions at home:  Follow instructions from your health care provider about what to eat or drink after your  procedure. Return to your normal activities as told by your health care provider. Ask your health care provider what activities are safe for you. Take over-the-counter and prescription medicines only as told by your health care provider. If you were given a sedative during the procedure, it can affect you for several hours. Do not drive or operate machinery until your health care provider says that it is safe. Keep all follow-up visits as told by your health care provider. This is important. Contact a health care provider if you have: A sore throat that lasts longer than one day. Trouble swallowing. Get help right away if: You vomit blood or your vomit looks like coffee grounds. You have: A fever. Bloody, black, or tarry stools. A severe sore throat or you cannot swallow. Difficulty breathing. Severe pain in your chest or abdomen. Summary After the procedure, it is common to have a sore throat, mild stomach discomfort, bloating, and nausea. If you were given a sedative during the procedure, it can affect you for several hours. Do not drive or operate machinery until your health care provider says that it is safe. Follow instructions from your health care provider about what to eat or drink after your procedure. Return to your normal activities as told by your health care provider. This information is not intended to replace advice given to you by your health care provider. Make sure you discuss any  questions you have with your healthcare provider. Document Revised: 06/23/2019 Document Reviewed: 11/25/2017 Elsevier Patient Education  2022 [place]. Colonoscopy, Adult, Care After This sheet gives you information about how to care for yourself after your procedure. Your health care provider may also give you more specific instructions. If you have problems or questions, contact your health careprovider. What can I expect after the procedure? After the procedure, it is common to have: A  small amount of blood in your stool for 24 hours after the procedure. Some gas. Mild cramping or bloating of your abdomen. Follow these instructions at home: Eating and drinking  Drink enough fluid to keep your urine pale yellow. Follow instructions from your health care provider about eating or drinking restrictions. Resume your normal diet as instructed by your health care provider. Avoid heavy or fried foods that are hard to digest.  Activity Rest as told by your health care provider. Avoid sitting for a long time without moving. Get up to take short walks every 1-2 hours. This is important to improve blood flow and breathing. Ask for help if you feel weak or unsteady. Return to your normal activities as told by your health care provider. Ask your health care provider what activities are safe for you. Managing cramping and bloating  Try walking around when you have cramps or feel bloated. Apply heat to your abdomen as told by your health care provider. Use the heat source that your health care provider recommends, such as a moist heat pack or a heating pad. Place a towel between your skin and the heat source. Leave the heat on for 20-30 minutes. Remove the heat if your skin turns bright red. This is especially important if you are unable to feel pain, heat, or cold. You may have a greater risk of getting burned.  General instructions If you were given a sedative during the procedure, it can affect you for several hours. Do not drive or operate machinery until your health care provider says that it is safe. For the first 24 hours after the procedure: Do not sign important documents. Do not drink alcohol. Do your regular daily activities at a slower pace than normal. Eat soft foods that are easy to digest. Take over-the-counter and prescription medicines only as told by your health care provider. Keep all follow-up visits as told by your health care provider. This is  important. Contact a health care provider if: You have blood in your stool 2-3 days after the procedure. Get help right away if you have: More than a small spotting of blood in your stool. Large blood clots in your stool. Swelling of your abdomen. Nausea or vomiting. A fever. Increasing pain in your abdomen that is not relieved with medicine. Summary After the procedure, it is common to have a small amount of blood in your stool. You may also have mild cramping and bloating of your abdomen. If you were given a sedative during the procedure, it can affect you for several hours. Do not drive or operate machinery until your health care provider says that it is safe. Get help right away if you have a lot of blood in your stool, nausea or vomiting, a fever, or increased pain in your abdomen. This information is not intended to replace advice given to you by your health care provider. Make sure you discuss any questions you have with your healthcare provider. Document Revised: 06/19/2019 Document Reviewed: 01/19/2019 Elsevier Patient Education  [place] Anesthesia  Care, Care After This sheet gives you information about how to care for yourself after your procedure. Your health care provider may also give you more specific instructions. If you have problems or questions, contact your health careprovider. What can I expect after the procedure? After the procedure, it is common to have: Tiredness. Forgetfulness about what happened after the procedure. Impaired judgment for important decisions. Nausea or vomiting. Some difficulty with balance. Follow these instructions at home: For the time period you were told by your health care provider:     Rest as needed. Do not participate in activities where you could fall or become injured. Do not drive or use machinery. Do not drink alcohol. Do not take sleeping pills or medicines that cause drowsiness. Do not make important  decisions or sign legal documents. Do not take care of children on your own. Eating and drinking Follow the diet that is recommended by your health care provider. Drink enough fluid to keep your urine pale yellow. If you vomit: Drink water, juice, or soup when you can drink without vomiting. Make sure you have little or no nausea before eating solid foods. General instructions Have a responsible adult stay with you for the time you are told. It is important to have someone help care for you until you are awake and alert. Take over-the-counter and prescription medicines only as told by your health care provider. If you have sleep apnea, surgery and certain medicines can increase your risk for breathing problems. Follow instructions from your health care provider about wearing your sleep device: Anytime you are sleeping, including during daytime naps. While taking prescription pain medicines, sleeping medicines, or medicines that make you drowsy. Avoid smoking. Keep all follow-up visits as told by your health care provider. This is important. Contact a health care provider if: You keep feeling nauseous or you keep vomiting. You feel light-headed. You are still sleepy or having trouble with balance after 24 hours. You develop a rash. You have a fever. You have redness or swelling around the IV site. Get help right away if: You have trouble breathing. You have new-onset confusion at home. Summary For several hours after your procedure, you may feel tired. You may also be forgetful and have poor judgment. Have a responsible adult stay with you for the time you are told. It is important to have someone help care for you until you are awake and alert. Rest as told. Do not drive or operate machinery. Do not drink alcohol or take sleeping pills. Get help right away if you have trouble breathing, or if you suddenly become confused. This information is not intended to replace advice given to you  by your health care provider. Make sure you discuss any questions you have with your healthcare provider. Document Revised: 03/10/2020 Document Reviewed: 05/28/2019 Elsevier Patient Education  2022 [employer].

## 2021-02-14 ENCOUNTER — Ambulatory Visit (INDEPENDENT_AMBULATORY_CARE_PROVIDER_SITE_OTHER): Payer: Medicaid Other | Admitting: Nurse Practitioner

## 2021-02-14 ENCOUNTER — Encounter: Payer: Medicaid Other | Admitting: Family Medicine

## 2021-02-14 ENCOUNTER — Other Ambulatory Visit: Payer: Self-pay

## 2021-02-14 ENCOUNTER — Encounter: Payer: Self-pay | Admitting: Nurse Practitioner

## 2021-02-14 VITALS — BP 100/65 | HR 69 | Temp 97.0°F | Ht 63.0 in | Wt 152.0 lb

## 2021-02-14 DIAGNOSIS — E039 Hypothyroidism, unspecified: Secondary | ICD-10-CM

## 2021-02-14 DIAGNOSIS — E7800 Pure hypercholesterolemia, unspecified: Secondary | ICD-10-CM

## 2021-02-14 DIAGNOSIS — N186 End stage renal disease: Secondary | ICD-10-CM

## 2021-02-14 DIAGNOSIS — K529 Noninfective gastroenteritis and colitis, unspecified: Secondary | ICD-10-CM | POA: Diagnosis not present

## 2021-02-14 DIAGNOSIS — Z992 Dependence on renal dialysis: Secondary | ICD-10-CM

## 2021-02-14 DIAGNOSIS — Z794 Long term (current) use of insulin: Secondary | ICD-10-CM

## 2021-02-14 DIAGNOSIS — I10 Essential (primary) hypertension: Secondary | ICD-10-CM

## 2021-02-14 DIAGNOSIS — E1122 Type 2 diabetes mellitus with diabetic chronic kidney disease: Secondary | ICD-10-CM

## 2021-02-14 DIAGNOSIS — Z0001 Encounter for general adult medical examination with abnormal findings: Secondary | ICD-10-CM

## 2021-02-14 DIAGNOSIS — E1121 Type 2 diabetes mellitus with diabetic nephropathy: Secondary | ICD-10-CM

## 2021-02-14 MED ORDER — GABAPENTIN 300 MG PO CAPS: 300.0000 mg | ORAL_CAPSULE | Freq: Three times a day (TID) | ORAL | 0 refills | Status: DC

## 2021-02-14 NOTE — Patient Instructions (Signed)
Please have fasting labs drawn today.  We will meet back up in 4 months to recheck labs. Please have fasting labs drawn 2-3 days prior to your appointment so we can discuss the results during your office visit.

## 2021-02-14 NOTE — Assessment & Plan Note (Signed)
-  on statin currently -no ARB, but she has ESRD, and nephrology manages this -checking A1c today

## 2021-02-14 NOTE — Assessment & Plan Note (Signed)
-  has upcoming EGD and colonoscopy with GI around 8/16

## 2021-02-14 NOTE — Assessment & Plan Note (Signed)
-  checking thyroid labs

## 2021-02-14 NOTE — Progress Notes (Signed)
Established Patient Office Visit  Subjective:  Patient ID: Melanie Hall, female    DOB: 1971/07/04  Age: 50 y.o. MRN: [id number]  CC:  Chief Complaint  Patient presents with   Annual Exam    CPE    HPI Melanie Hall presents for physical exam.  She states that she has pain related to diabetic neuropathy. She sees pain management for this as well as other sources of pain.  She gets dialysis at [facility] for ESRD.  Past Medical History:  Diagnosis Date   Acute cystitis without hematuria    Acute midline thoracic back pain 03/12/91   Acute systolic heart failure ([place]) 10/21/2019   Anemia    Arthritis    Cataracts, bilateral    surgery to remove   CATARACTS, BILATERAL 07/02/2007   Qualifier: Diagnosis of  By: Isla Pence     Closed fracture of left femur ([place]) 10/28/2018   Closed fracture of right ankle 11/06/2017   Diabetes mellitus    type 2   Emphysematous cystitis 08/26/2018   Encounter for gynecological examination with Papanicolaou smear of cervix 01/14/2020   Encounter for screening fecal occult blood testing 01/14/2020   Encounter for screening for malignant neoplasm of cervix 12/29/2019   Encounter for screening mammogram for malignant neoplasm of breast 12/29/2019   ESRD on hemodialysis ([place])    MWF - in [place]   GERD (gastroesophageal reflux disease)    Hyperlipidemia    Hypertension    Hypokalemia 08/26/2018   IRREGULAR MENSES 09/14/2009   Qualifier: Diagnosis of  By: Hassell Done FNP, [place]     Loose stools 11/12/2019   Normocytic anemia 08/26/2018   PARONYCHIA, RIGHT GREAT TOE 07/30/2008   Qualifier: Diagnosis of  By: Hassell Done FNP, Nykedtra     Pressure ulcer 09/10/2019   Right arm weakness 08/08/2019   Sprain of left ankle    STEMI (ST elevation myocardial infarction) ([place]) 10/18/2019   STEMI involving right coronary artery ([place]) 10/18/2019   Stroke ([place]) 10/2019   SVD (spontaneous vaginal delivery)    x 4   Vaginosis 08/26/2018   Weakness 09/10/2019   Weakness of  both lower extremities     Past Surgical History:  Procedure Laterality Date   A/V FISTULAGRAM Left 01/12/2020   Procedure: A/V FISTULAGRAM;  Surgeon: Serafina Mitchell, MD;  Location: [place] [facility];  Service: Cardiovascular;  Laterality: Left;   A/V FISTULAGRAM N/A 03/17/2020   Procedure: A/V FISTULAGRAM - Left Arm;  Surgeon: Marty Heck, MD;  Location: [place] [facility];  Service: Cardiovascular;  Laterality: N/A;   A/V FISTULAGRAM Left 06/23/2020   Procedure: A/V FISTULAGRAM;  Surgeon: Marty Heck, MD;  Location: [place] [facility];  Service: Cardiovascular;  Laterality: Left;   ANKLE FUSION Left 06/01/2020   Procedure: LEFT ANKLE FUSION;  Surgeon: Newt Minion, MD;  Location: [place];  Service: Orthopedics;  Laterality: Left;   AV FISTULA PLACEMENT Left 08/17/2019   Procedure: LEFT BRACHIAL CEPHALIC ARTERIOVENOUS (AV) FISTULA;  Surgeon: Angelia Mould, MD;  Location: [place];  Service: Vascular;  Laterality: Left;   CORONARY STENT INTERVENTION N/A 10/18/2019   Procedure: CORONARY STENT INTERVENTION;  Surgeon: Sherren Mocha, MD;  Location: [place] [facility];  Service: Cardiovascular;  Laterality: N/A;   CORONARY/GRAFT ACUTE MI REVASCULARIZATION N/A 10/18/2019   Procedure: Coronary/Graft Acute MI Revascularization;  Surgeon: Sherren Mocha, MD;  Location: [place] [facility];  Service: Cardiovascular;  Laterality: N/A;   EYE SURGERY Bilateral  cataracts removed   FEMUR IM NAIL Left 10/28/2018   Procedure: RETROGRADE FEMORAL NAILING;  Surgeon: Meredith Pel, MD;  Location: [place];  Service: Orthopedics;  Laterality: Left;   IM NAILING FEMORAL SHAFT RETROGRADE Left 10/28/2018   INTRAVASCULAR ULTRASOUND/IVUS N/A 10/18/2019   Procedure: Intravascular Ultrasound/IVUS;  Surgeon: Sherren Mocha, MD;  Location: [place] [facility];  Service: Cardiovascular;  Laterality: N/A;   IR FLUORO GUIDE CV LINE RIGHT  08/11/2019   IR THORACENTESIS ASP PLEURAL SPACE W/IMG GUIDE   11/12/2019   IR US GUIDE VASC ACCESS RIGHT  08/11/2019   KNEE SURGERY Left    LEFT HEART CATH AND CORONARY ANGIOGRAPHY N/A 10/18/2019   Procedure: LEFT HEART CATH AND CORONARY ANGIOGRAPHY;  Surgeon: Sherren Mocha, MD;  Location: [place] [facility];  Service: Cardiovascular;  Laterality: N/A;   PERIPHERAL VASCULAR BALLOON ANGIOPLASTY Left 01/12/2020   Procedure: PERIPHERAL VASCULAR BALLOON ANGIOPLASTY;  Surgeon: Serafina Mitchell, MD;  Location: [place] [facility];  Service: Cardiovascular;  Laterality: Left;  AVF   PERIPHERAL VASCULAR BALLOON ANGIOPLASTY Left 03/17/2020   Procedure: PERIPHERAL VASCULAR BALLOON ANGIOPLASTY;  Surgeon: Marty Heck, MD;  Location: [place] [facility];  Service: Cardiovascular;  Laterality: Left;  AVF   PERIPHERAL VASCULAR BALLOON ANGIOPLASTY Left 05/05/2020   Procedure: PERIPHERAL VASCULAR BALLOON ANGIOPLASTY;  Surgeon: Marty Heck, MD;  Location: [place] [facility];  Service: Cardiovascular;  Laterality: Left;  arm fistula   RADIOLOGY WITH ANESTHESIA N/A 09/15/2019   Procedure: [facility] AND LUMBER LOWER BACK PAIN;  Surgeon: Radiologist, Medication, MD;  Location: [place];  Service: Radiology;  Laterality: N/A;   TUBAL LIGATION      Family History  Problem Relation Age of Onset   Diabetes Mother    Hypertension Mother    Bipolar disorder Mother    Brain cancer Maternal Aunt    Heart disease Maternal Grandmother    Diabetes Maternal Grandmother    Breast cancer Maternal Aunt    Depression Father        Committed suicide   Diabetes Sister     Social History   Socioeconomic History   Marital status: Widowed    Spouse name: Not on file   Number of children: 4   Years of education: Not on file   Highest education level: 10th grade  Occupational History   Not on file  Tobacco Use   Smoking status: Former    Packs/day: 0.25    Years: 2.00    Pack years: 0.50    Types: Cigarettes    Quit date: 16    Years since quitting: 25.6   Smokeless  tobacco: Never  Vaping Use   Vaping Use: Never used  Substance and Sexual Activity   Alcohol use: No   Drug use: No   Sexual activity: Yes    Birth control/protection: Surgical    Comment: tubal   Other Topics Concern   Not on file  Social History Narrative   Lives with friend with [place]   Cats: [place], [place], [place], 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      Enjoy: watching TV-[employer], [employer], and [employer]      Diet: eats all food groups: focuses on Dm and Kidney friendly foods    Caffeine: none   Water: Fluid Restriction 32 ounces a day          Wears seat belt: not currently driving    Oceanographer at home    No weapons    Social Determinants of  Health   Financial Resource Strain: Not on file  Food Insecurity: Not on file  Transportation Needs: Not on file  Physical Activity: Not on file  Stress: Not on file  Social Connections: Not on file  Intimate Partner Violence: Not on file    Outpatient Medications Prior to Visit  Medication Sig Dispense Refill   aspirin 81 MG EC tablet Take 1 tablet (81 mg total) by mouth daily. 90 tablet 3   atorvastatin (LIPITOR) 80 MG tablet Take 1 tablet (80 mg total) by mouth daily at 6 PM. 90 tablet 3   BRILINTA 90 MG TABS tablet Take 1 tablet by mouth twice daily 60 tablet 0   carvedilol (COREG) 12.5 MG tablet Take 1 tablet (12.5 mg total) by mouth 2 (two) times daily. 180 tablet 3   colestipol (COLESTID) 1 g tablet Take 1 tablet (1 g total) by mouth 2 (two) times daily. 180 tablet 3   epoetin alfa (EPOGEN) 3000 UNIT/ML injection Inject 3,000 Units into the skin every 14 (fourteen) days.      hydrALAZINE (APRESOLINE) 10 MG tablet Take 1 tablet (10 mg total) by mouth in the morning and at bedtime. 180 tablet 3   insulin aspart protamine- aspart (NOVOLOG MIX 70/30) (70-30) 100 UNIT/ML injection Inject 0.1 mLs (10 Units total) into the skin 2 (two) times daily with a meal. 10 mL 3   levothyroxine (SYNTHROID) 25 MCG tablet Take 1 tablet (25 mcg total) by mouth  daily before breakfast. 30 tablet 1   lidocaine-prilocaine (EMLA) cream Apply topically.     methocarbamol (ROBAXIN) 500 MG tablet Take 1 tablet (500 mg total) by mouth every 8 (eight) hours as needed for muscle spasms. 45 tablet 1   multivitamin (RENA-VIT) TABS tablet Take 1 tablet by mouth at bedtime. 30 tablet 0   Na Sulfate-K Sulfate-Mg Sulf (SUPREP BOWEL PREP KIT) 17.5-3.13-1.6 GM/177ML SOLN Take 1 kit by mouth as directed. 354 mL 0   nitroGLYCERIN (NITROSTAT) 0.4 MG SL tablet Place 1 tablet (0.4 mg total) under the tongue every 5 (five) minutes x 3 doses as needed for chest pain. 25 tablet 3   ondansetron (ZOFRAN ODT) 4 MG disintegrating tablet Take 1 tablet (4 mg total) by mouth every 8 (eight) hours as needed for nausea or vomiting. 15 tablet 0   silver sulfADIAZINE (SILVADENE) 1 % cream Apply 1 application topically daily. 50 g 0   tamsulosin (FLOMAX) 0.4 MG CAPS capsule Take 1 capsule (0.4 mg total) by mouth daily after supper. 90 capsule 0   traMADol (ULTRAM) 50 MG tablet Take 1 tablet (50 mg total) by mouth daily as needed. 30 tablet 5   gabapentin (NEURONTIN) 300 MG capsule Take 1 capsule (300 mg total) by mouth 3 (three) times daily. 90 capsule 0   isosorbide mononitrate (IMDUR) 30 MG 24 hr tablet Take 0.5 tablets (15 mg total) by mouth daily. 15 tablet 11   Facility-Administered Medications Prior to Visit  Medication Dose Route Frequency Provider Last Rate Last Admin   0.9 %  sodium chloride infusion  250 mL Intravenous PRN Marty Heck, MD       0.9 %  sodium chloride infusion  250 mL Intravenous PRN Marty Heck, MD       sodium chloride flush (NS) 0.9 % injection 3 mL  3 mL Intravenous Q12H Marty Heck, MD       sodium chloride flush (NS) 0.9 % injection 3 mL  3 mL Intravenous PRN Monica Martinez  J, MD        Allergies  Allergen Reactions   Contrast Media [Iodinated Diagnostic Agents] Nausea And Vomiting    Treated with Benadryl & Solumedrol    Penicillins Other (See Comments)    Don't want to take PCN due to family history     ROS Review of Systems  Constitutional: Negative.   HENT: Negative.    Eyes: Negative.   Respiratory: Negative.    Cardiovascular: Negative.   Gastrointestinal: Negative.   Endocrine: Negative.   Genitourinary: Negative.   Musculoskeletal:  Positive for arthralgias.  Skin: Negative.   Allergic/Immunologic: Negative.   Neurological:  Positive for numbness.       To bilateral feet  Hematological: Negative.   Psychiatric/Behavioral: Negative.       Objective:    Physical Exam Constitutional:      Appearance: Normal appearance. She is ill-appearing.  HENT:     Head: Normocephalic and atraumatic.     Right Ear: Tympanic membrane, ear canal and external ear normal.     Left Ear: Tympanic membrane, ear canal and external ear normal.     Nose: Nose normal.     Mouth/Throat:     Mouth: Mucous membranes are moist.     Pharynx: Oropharynx is clear.  Eyes:     Extraocular Movements: Extraocular movements intact.     Conjunctiva/sclera: Conjunctivae normal.     Pupils: Pupils are equal, round, and reactive to light.  Cardiovascular:     Rate and Rhythm: Normal rate and regular rhythm.     Pulses: Normal pulses.          Dorsalis pedis pulses are 2+ on the right side and 2+ on the left side.     Heart sounds: Normal heart sounds.  Pulmonary:     Effort: Pulmonary effort is normal.     Breath sounds: Normal breath sounds.  Abdominal:     General: Abdomen is flat. Bowel sounds are normal.     Palpations: Abdomen is soft.  Musculoskeletal:        General: Deformity present.     Cervical back: Normal range of motion and neck supple.     Left foot: Deformity present.     Comments: Uses walker for ambulation; deformity to left foot  Feet:     Right foot:     Protective Sensation: 10 sites tested.  8 sites sensed.     Skin integrity: Callus and dry skin present.     Toenail Condition: Right  toenails are normal.     Left foot:     Protective Sensation: 10 sites tested.  10 sites sensed.     Skin integrity: Callus and dry skin present.     Toenail Condition: Left toenails are normal.  Neurological:     General: No focal deficit present.     Mental Status: She is alert and oriented to person, place, and time.     Cranial Nerves: No cranial nerve deficit.     Sensory: Sensory deficit present.     Coordination: Coordination normal.     Gait: Gait abnormal.     Comments: Strength 4/5 globally  Psychiatric:        Mood and Affect: Mood normal.        Behavior: Behavior normal.        Thought Content: Thought content normal.        Judgment: Judgment normal.    BP 100/65 (BP Location: Right Arm, Patient Position: Sitting,  Cuff Size: Large)   Pulse 69   Temp (!) 97 F (36.1 C) (Temporal)   Ht '5\' 3"'  (1.6 m)   Wt 152 lb (68.9 kg)   LMP 12/17/2016   SpO2 96%   BMI 26.93 kg/m  Wt Readings from Last 3 Encounters:  02/14/21 152 lb (68.9 kg)  02/07/21 162 lb 12.8 oz (73.8 kg)  01/26/21 151 lb (68.5 kg)     Health Maintenance Due  Topic Date Due   Pneumococcal Vaccine 84-24 Years old (2 - PCV) 09/17/2009   COLONOSCOPY (Pts 45-52yr Insurance coverage will need to be confirmed)  Never done   HEMOGLOBIN A1C  11/30/2020   INFLUENZA VACCINE  02/06/2021    There are no preventive care reminders to display for this patient.  Lab Results  Component Value Date   TSH 3.28 02/02/2021   Lab Results  Component Value Date   WBC 8.6 11/10/2020   HGB 10.4 (L) 11/10/2020   HCT 32.6 (L) 11/10/2020   MCV 100 (H) 11/10/2020   PLT 212 11/10/2020   Lab Results  Component Value Date   NA 134 11/10/2020   K 4.1 11/10/2020   CO2 22 11/10/2020   GLUCOSE 199 (H) 11/10/2020   BUN 20 11/10/2020   CREATININE 4.05 (H) 11/10/2020   BILITOT 0.8 11/10/2020   ALKPHOS 218 (H) 11/10/2020   AST 13 11/10/2020   ALT 8 11/10/2020   PROT 6.7 11/10/2020   ALBUMIN 3.8 11/10/2020   CALCIUM  8.6 (L) 11/10/2020   ANIONGAP 17 (H) 10/17/2020   ANIONGAP 18 (H) 10/17/2020   EGFR 13 (L) 11/10/2020   Lab Results  Component Value Date   CHOL 94 (L) 12/15/2019   Lab Results  Component Value Date   HDL 35 (L) 12/15/2019   Lab Results  Component Value Date   LDLCALC 41 12/15/2019   Lab Results  Component Value Date   TRIG 94 12/15/2019   Lab Results  Component Value Date   CHOLHDL 2.7 12/15/2019   Lab Results  Component Value Date   HGBA1C 8.7 (H) 06/02/2020      Assessment & Plan:   Problem List Items Addressed This Visit       Cardiovascular and Mediastinum   Essential hypertension    BP Readings from Last 3 Encounters:  02/14/21 100/65  02/07/21 (!) 160/80  01/26/21 (!) 166/75  -well controlled; no med changes -she states carvedilol was recently increased by cardiology      Relevant Orders   CBC with Differential/Platelet   CMP14+EGFR   Lipid Panel With LDL/HDL Ratio   CBC with Differential/Platelet   CMP14+EGFR   Lipid Panel With LDL/HDL Ratio     Digestive   Chronic diarrhea of unknown origin    -has upcoming EGD and colonoscopy with GI around 8/16         Endocrine   Type 2 diabetes with nephropathy (HCC)   Relevant Medications   gabapentin (NEURONTIN) 300 MG capsule   Other Relevant Orders   Lipid Panel With LDL/HDL Ratio   Hemoglobin A1c   Hemoglobin A1c   Type 2 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, with long-term current use of insulin (HCC)    -on statin currently -no ARB, but she has ESRD, and nephrology manages this -checking A1c today       Relevant Orders   Hemoglobin A1c   Hypothyroidism    -checking thyroid labs       Relevant Orders  TSH + free T4   TSH + free T4     Other   Hypercholesterolemia    -on statin -checking lipids       Relevant Orders   Lipid Panel With LDL/HDL Ratio   Other Visit Diagnoses     Encounter for general adult medical examination with abnormal  findings    -  Primary   Relevant Orders   CBC with Differential/Platelet   CMP14+EGFR   Lipid Panel With LDL/HDL Ratio   Hemoglobin A1c   TSH + free T4       Meds ordered this encounter  Medications   gabapentin (NEURONTIN) 300 MG capsule    Sig: Take 1 capsule (300 mg total) by mouth 3 (three) times daily.    Dispense:  90 capsule    Refill:  0    Follow-up: Return in about 4 months (around 06/16/2021) for Lab follow-up (HTN, DM, HLD, thyroid).    Noreene Larsson, NP

## 2021-02-14 NOTE — Assessment & Plan Note (Signed)
BP Readings from Last 3 Encounters:  02/14/21 100/65  02/07/21 (!) 160/80  01/26/21 (!) 166/75   -well controlled; no med changes -she states carvedilol was recently increased by cardiology

## 2021-02-14 NOTE — Assessment & Plan Note (Signed)
-  on statin -checking lipids

## 2021-02-15 ENCOUNTER — Other Ambulatory Visit: Payer: Self-pay | Admitting: Nurse Practitioner

## 2021-02-15 DIAGNOSIS — E039 Hypothyroidism, unspecified: Secondary | ICD-10-CM

## 2021-02-15 DIAGNOSIS — N186 End stage renal disease: Secondary | ICD-10-CM

## 2021-02-15 DIAGNOSIS — Z794 Long term (current) use of insulin: Secondary | ICD-10-CM

## 2021-02-15 LAB — CMP14+EGFR
ALT: 6 IU/L (ref 0–32)
AST: 8 IU/L (ref 0–40)
Albumin/Globulin Ratio: 1.8 (ref 1.2–2.2)
Albumin: 4.5 g/dL (ref 3.8–4.9)
Alkaline Phosphatase: 127 IU/L — ABNORMAL HIGH (ref 44–121)
BUN/Creatinine Ratio: 5 — ABNORMAL LOW (ref 9–23)
BUN: 21 mg/dL (ref 6–24)
Bilirubin Total: 0.6 mg/dL (ref 0.0–1.2)
CO2: 24 mmol/L (ref 20–29)
Calcium: 8.8 mg/dL (ref 8.7–10.2)
Chloride: 95 mmol/L — ABNORMAL LOW (ref 96–106)
Creatinine, Ser: 4.31 mg/dL — ABNORMAL HIGH (ref 0.57–1.00)
Globulin, Total: 2.5 g/dL (ref 1.5–4.5)
Glucose: 242 mg/dL — ABNORMAL HIGH (ref 65–99)
Potassium: 4.8 mmol/L (ref 3.5–5.2)
Sodium: 138 mmol/L (ref 134–144)
Total Protein: 7 g/dL (ref 6.0–8.5)
eGFR: 12 mL/min/{1.73_m2} — ABNORMAL LOW (ref >59)

## 2021-02-15 LAB — HEMOGLOBIN A1C
Est. average glucose Bld gHb Est-mCnc: 203 mg/dL
Hgb A1c MFr Bld: 8.7 % — ABNORMAL HIGH (ref 4.8–5.6)

## 2021-02-15 LAB — LIPID PANEL WITH LDL/HDL RATIO
Cholesterol, Total: 139 mg/dL (ref 100–199)
HDL: 40 mg/dL (ref >39)
LDL Chol Calc (NIH): 83 mg/dL (ref 0–99)
LDL/HDL Ratio: 2.1 ratio (ref 0.0–3.2)
Triglycerides: 82 mg/dL (ref 0–149)
VLDL Cholesterol Cal: 16 mg/dL (ref 5–40)

## 2021-02-15 LAB — CBC WITH DIFFERENTIAL/PLATELET
Basophils Absolute: 0.1 10*3/uL (ref 0.0–0.2)
Basos: 1 %
EOS (ABSOLUTE): 0.4 10*3/uL (ref 0.0–0.4)
Eos: 5 %
Hematocrit: 35.4 % (ref 34.0–46.6)
Hemoglobin: 11.5 g/dL (ref 11.1–15.9)
Immature Grans (Abs): 0.1 10*3/uL (ref 0.0–0.1)
Immature Granulocytes: 1 %
Lymphocytes Absolute: 1.5 10*3/uL (ref 0.7–3.1)
Lymphs: 20 %
MCH: 34.4 pg — ABNORMAL HIGH (ref 26.6–33.0)
MCHC: 32.5 g/dL (ref 31.5–35.7)
MCV: 106 fL — ABNORMAL HIGH (ref 79–97)
Monocytes Absolute: 0.6 10*3/uL (ref 0.1–0.9)
Monocytes: 8 %
Neutrophils Absolute: 4.8 10*3/uL (ref 1.4–7.0)
Neutrophils: 65 %
Platelets: 195 10*3/uL (ref 150–450)
RBC: 3.34 x10E6/uL — ABNORMAL LOW (ref 3.77–5.28)
RDW: 14.8 % (ref 11.7–15.4)
WBC: 7.4 10*3/uL (ref 3.4–10.8)

## 2021-02-15 LAB — TSH+FREE T4
Free T4: 1.6 ng/dL (ref 0.82–1.77)
TSH: 5.57 u[IU]/mL — ABNORMAL HIGH (ref 0.450–4.500)

## 2021-02-15 MED ORDER — LEVOTHYROXINE SODIUM 50 MCG PO TABS: 50.0000 ug | ORAL_TABLET | Freq: Every day | ORAL | 1 refills | Status: DC

## 2021-02-15 NOTE — Progress Notes (Signed)
Blood sugar and TSH were elevated. I increased her levothyroxine and sent in an endocrinology referral.

## 2021-02-16 ENCOUNTER — Ambulatory Visit (HOSPITAL_COMMUNITY): Payer: Medicaid Other | Attending: Cardiology

## 2021-02-16 ENCOUNTER — Other Ambulatory Visit: Payer: Self-pay

## 2021-02-16 ENCOUNTER — Telehealth (INDEPENDENT_AMBULATORY_CARE_PROVIDER_SITE_OTHER): Payer: Self-pay | Admitting: *Deleted

## 2021-02-16 ENCOUNTER — Encounter: Payer: Medicaid Other | Admitting: Physical Medicine & Rehabilitation

## 2021-02-16 ENCOUNTER — Other Ambulatory Visit: Payer: Medicaid Other | Admitting: *Deleted

## 2021-02-16 DIAGNOSIS — Z992 Dependence on renal dialysis: Secondary | ICD-10-CM

## 2021-02-16 DIAGNOSIS — I5020 Unspecified systolic (congestive) heart failure: Secondary | ICD-10-CM

## 2021-02-16 DIAGNOSIS — E782 Mixed hyperlipidemia: Secondary | ICD-10-CM | POA: Diagnosis present

## 2021-02-16 DIAGNOSIS — Z01810 Encounter for preprocedural cardiovascular examination: Secondary | ICD-10-CM

## 2021-02-16 DIAGNOSIS — I10 Essential (primary) hypertension: Secondary | ICD-10-CM

## 2021-02-16 DIAGNOSIS — N186 End stage renal disease: Secondary | ICD-10-CM

## 2021-02-16 DIAGNOSIS — I25119 Atherosclerotic heart disease of native coronary artery with unspecified angina pectoris: Secondary | ICD-10-CM

## 2021-02-16 LAB — COMPREHENSIVE METABOLIC PANEL
ALT: 5 IU/L (ref 0–32)
AST: 8 IU/L (ref 0–40)
Albumin/Globulin Ratio: 1.5 (ref 1.2–2.2)
Albumin: 3.8 g/dL (ref 3.8–4.9)
Alkaline Phosphatase: 103 IU/L (ref 44–121)
BUN/Creatinine Ratio: 6 — ABNORMAL LOW (ref 9–23)
BUN: 24 mg/dL (ref 6–24)
Bilirubin Total: 0.6 mg/dL (ref 0.0–1.2)
CO2: 25 mmol/L (ref 20–29)
Calcium: 8.7 mg/dL (ref 8.7–10.2)
Chloride: 98 mmol/L (ref 96–106)
Creatinine, Ser: 4.24 mg/dL — ABNORMAL HIGH (ref 0.57–1.00)
Globulin, Total: 2.6 g/dL (ref 1.5–4.5)
Glucose: 210 mg/dL — ABNORMAL HIGH (ref 65–99)
Potassium: 4.2 mmol/L (ref 3.5–5.2)
Sodium: 139 mmol/L (ref 134–144)
Total Protein: 6.4 g/dL (ref 6.0–8.5)
eGFR: 12 mL/min/{1.73_m2} — ABNORMAL LOW (ref >59)

## 2021-02-16 LAB — MYOCARDIAL PERFUSION IMAGING
LV dias vol: 88 mL (ref 46–106)
LV sys vol: 41 mL
Peak HR: 87 {beats}/min
Rest HR: 72 {beats}/min
SDS: 1
SRS: 1
SSS: 2
TID: 0.92

## 2021-02-16 LAB — LIPID PANEL
Chol/HDL Ratio: 3.6 ratio (ref 0.0–4.4)
Cholesterol, Total: 130 mg/dL (ref 100–199)
HDL: 36 mg/dL — ABNORMAL LOW (ref >39)
LDL Chol Calc (NIH): 76 mg/dL (ref 0–99)
Triglycerides: 95 mg/dL (ref 0–149)
VLDL Cholesterol Cal: 18 mg/dL (ref 5–40)

## 2021-02-16 MED ORDER — TECHNETIUM TC 99M TETROFOSMIN IV KIT
31.4000 | PACK | Freq: Once | INTRAVENOUS | Status: AC | PRN
  Administered 2021-02-16: 31.4 via INTRAVENOUS
  Filled 2021-02-16: qty 32

## 2021-02-16 MED ORDER — TECHNETIUM TC 99M TETROFOSMIN IV KIT
10.8000 | PACK | Freq: Once | INTRAVENOUS | Status: AC | PRN
  Administered 2021-02-16: 10.8 via INTRAVENOUS
  Filled 2021-02-16: qty 11

## 2021-02-16 MED ORDER — REGADENOSON 0.4 MG/5ML IV SOLN
0.4000 mg | Freq: Once | INTRAVENOUS | Status: AC
  Administered 2021-02-16: 0.4 mg via INTRAVENOUS

## 2021-02-16 NOTE — Telephone Encounter (Signed)
Thanks Lelon Frohlich, she is supposed to have a [place] and cardiology will evaluate the clearance for her procedures then

## 2021-02-16 NOTE — Telephone Encounter (Signed)
Per Dr Jenetta Downer need to cancel procedure for 02/21/21 until patient gets clearance from cardiologist. I called and left detailed message for patient and called and left Hoyle Sauer message to cancel.

## 2021-02-17 ENCOUNTER — Encounter: Payer: Self-pay | Admitting: Physician Assistant

## 2021-02-17 ENCOUNTER — Encounter (HOSPITAL_COMMUNITY)
Admission: RE | Admit: 2021-02-17 | Discharge: 2021-02-17 | Disposition: A | Discharge status: Home / Self Care | Payer: No Typology Code available for payment source | Source: Ambulatory Visit | Attending: Gastroenterology | Admitting: Gastroenterology

## 2021-02-17 ENCOUNTER — Telehealth: Payer: Self-pay | Admitting: Physician Assistant

## 2021-02-17 ENCOUNTER — Encounter (HOSPITAL_COMMUNITY): Payer: Self-pay

## 2021-02-17 DIAGNOSIS — I2510 Atherosclerotic heart disease of native coronary artery without angina pectoris: Secondary | ICD-10-CM | POA: Insufficient documentation

## 2021-02-17 DIAGNOSIS — I255 Ischemic cardiomyopathy: Secondary | ICD-10-CM | POA: Insufficient documentation

## 2021-02-17 MED ORDER — EZETIMIBE 10 MG PO TABS: 10.0000 mg | ORAL_TABLET | Freq: Every day | ORAL | 3 refills | Status: AC

## 2021-02-17 NOTE — Telephone Encounter (Signed)
Reviewed w patient. She has an appointment with [facility] on 11/22.  Will plan to have repeat fasting lipids/alt at that visit. Will continue atorvastatin and add zetia.

## 2021-02-17 NOTE — Telephone Encounter (Signed)
Patient returning call to go over stress test results

## 2021-02-17 NOTE — Telephone Encounter (Signed)
-----   Message from Liliane Shi, [place] sent at 02/17/2021  7:34 AM EDT ----- Dialysis pt (Creatinine elevated).  K+, LFTs normal.  LDL above goal. PLAN:  -Continue current dose of Atorvastatin. -Add Ezetimibe (Zetia) 10 mg once daily. -Fasting Lipids, ALT in 3 mos  Richardson Dopp, PA-C    02/17/2021 7:31 AM

## 2021-02-17 NOTE — Telephone Encounter (Signed)
Myoview performed yesterday was low risk. See my recent office note. Pt may proceed at acceptable risk. Richardson Dopp, PA-C    02/17/2021 7:35 AM

## 2021-02-17 NOTE — Progress Notes (Signed)
Thanks [employer]. Please keep me posted about her clearance to reschedule her if appropriate.   Thanks

## 2021-02-17 NOTE — Telephone Encounter (Signed)
Hi,  So she got clearance from her cardiologist to proceed with the procedures ok Tuesday as initially scheduled.  Can you please put her back at 730 AM on Tuesday please? Thanks

## 2021-02-21 ENCOUNTER — Encounter (HOSPITAL_COMMUNITY): Admission: RE | Payer: Self-pay | Source: Home / Self Care

## 2021-02-21 ENCOUNTER — Encounter: Payer: Medicaid Other | Admitting: Physical Medicine & Rehabilitation

## 2021-02-21 ENCOUNTER — Ambulatory Visit (HOSPITAL_COMMUNITY): Admission: RE | Admit: 2021-02-21 | Payer: Medicaid Other | Source: Home / Self Care | Admitting: Gastroenterology

## 2021-02-21 SURGERY — COLONOSCOPY WITH PROPOFOL
Anesthesia: Monitor Anesthesia Care

## 2021-02-21 NOTE — Telephone Encounter (Signed)
Thanks

## 2021-02-22 ENCOUNTER — Other Ambulatory Visit (HOSPITAL_COMMUNITY): Payer: Medicaid Other

## 2021-02-23 ENCOUNTER — Encounter (INDEPENDENT_AMBULATORY_CARE_PROVIDER_SITE_OTHER): Payer: Self-pay

## 2021-02-23 NOTE — Patient Instructions (Signed)
ARYANNAH MOHON  02/23/2021     @PREFPERIOPPHARMACY @   Your procedure is scheduled on  02/28/2021.   Report to [facility] at  0900 A.M.   Call this number if you have problems the morning of surgery:  [phone]   Remember:  Follow the diet and prep instructions given to you by the office.    Take these medicines the morning of surgery with A SIP OF WATER            carvedilol, gabapentin, isosorbide, levothyroxine, robaxin (if needed), zofran (if needed), tramadol (if needed).     Do not wear jewelry, make-up or nail polish.  Do not wear lotions, powders, or perfumes, or deodorant.  Do not shave 48 hours prior to surgery.  Men may shave face and neck.  Do not bring valuables to the hospital.  [facility] is not responsible for any belongings or valuables.  Contacts, dentures or bridgework may not be worn into surgery.  Leave your suitcase in the car.  After surgery it may be brought to your room.  For patients admitted to the hospital, discharge time will be determined by your treatment team.  Patients discharged the day of surgery will not be allowed to drive home and must have someone with them for 24 hours.    Special instructions:   DO NOT smoke tobaco or vape for 24 hours before your procedure.  Please read over the following fact sheets that you were given. Anesthesia Post-op Instructions and Care and Recovery After Surgery      Upper Endoscopy, Adult, Care After This sheet gives you information about how to care for yourself after your procedure. Your health care provider may also give you more specific instructions. If you have problems or questions, contact your health careprovider. What can I expect after the procedure? After the procedure, it is common to have: A sore throat. Mild stomach pain or discomfort. Bloating. Nausea. Follow these instructions at home:  Follow instructions from your health care provider about what to eat or drink  after your procedure. Return to your normal activities as told by your health care provider. Ask your health care provider what activities are safe for you. Take over-the-counter and prescription medicines only as told by your health care provider. If you were given a sedative during the procedure, it can affect you for several hours. Do not drive or operate machinery until your health care provider says that it is safe. Keep all follow-up visits as told by your health care provider. This is important. Contact a health care provider if you have: A sore throat that lasts longer than one day. Trouble swallowing. Get help right away if: You vomit blood or your vomit looks like coffee grounds. You have: A fever. Bloody, black, or tarry stools. A severe sore throat or you cannot swallow. Difficulty breathing. Severe pain in your chest or abdomen. Summary After the procedure, it is common to have a sore throat, mild stomach discomfort, bloating, and nausea. If you were given a sedative during the procedure, it can affect you for several hours. Do not drive or operate machinery until your health care provider says that it is safe. Follow instructions from your health care provider about what to eat or drink after your procedure. Return to your normal activities as told by your health care provider. This information is not intended to replace advice given to you by your health care provider.  Make sure you discuss any questions you have with your healthcare provider. Document Revised: 06/23/2019 Document Reviewed: 11/25/2017 Elsevier Patient Education  2022 [employer]. Colonoscopy, Adult, Care After This sheet gives you information about how to care for yourself after your procedure. Your health care provider may also give you more specific instructions. If you have problems or questions, contact your health careprovider. What can I expect after the procedure? After the procedure, it is common  to have: A small amount of blood in your stool for 24 hours after the procedure. Some gas. Mild cramping or bloating of your abdomen. Follow these instructions at home: Eating and drinking  Drink enough fluid to keep your urine pale yellow. Follow instructions from your health care provider about eating or drinking restrictions. Resume your normal diet as instructed by your health care provider. Avoid heavy or fried foods that are hard to digest.  Activity Rest as told by your health care provider. Avoid sitting for a long time without moving. Get up to take short walks every 1-2 hours. This is important to improve blood flow and breathing. Ask for help if you feel weak or unsteady. Return to your normal activities as told by your health care provider. Ask your health care provider what activities are safe for you. Managing cramping and bloating  Try walking around when you have cramps or feel bloated. Apply heat to your abdomen as told by your health care provider. Use the heat source that your health care provider recommends, such as a moist heat pack or a heating pad. Place a towel between your skin and the heat source. Leave the heat on for 20-30 minutes. Remove the heat if your skin turns bright red. This is especially important if you are unable to feel pain, heat, or cold. You may have a greater risk of getting burned.  General instructions If you were given a sedative during the procedure, it can affect you for several hours. Do not drive or operate machinery until your health care provider says that it is safe. For the first 24 hours after the procedure: Do not sign important documents. Do not drink alcohol. Do your regular daily activities at a slower pace than normal. Eat soft foods that are easy to digest. Take over-the-counter and prescription medicines only as told by your health care provider. Keep all follow-up visits as told by your health care provider. This is  important. Contact a health care provider if: You have blood in your stool 2-3 days after the procedure. Get help right away if you have: More than a small spotting of blood in your stool. Large blood clots in your stool. Swelling of your abdomen. Nausea or vomiting. A fever. Increasing pain in your abdomen that is not relieved with medicine. Summary After the procedure, it is common to have a small amount of blood in your stool. You may also have mild cramping and bloating of your abdomen. If you were given a sedative during the procedure, it can affect you for several hours. Do not drive or operate machinery until your health care provider says that it is safe. Get help right away if you have a lot of blood in your stool, nausea or vomiting, a fever, or increased pain in your abdomen. This information is not intended to replace advice given to you by your health care provider. Make sure you discuss any questions you have with your healthcare provider. Document Revised: 06/19/2019 Document Reviewed: 01/19/2019 Elsevier Patient Education  [place] After This sheet gives you information about how to care for yourself after your procedure. Your health care provider may also give you more specific instructions. If you have problems or questions, contact your health careprovider. What can I expect after the procedure? After the procedure, it is common to have: Tiredness. Forgetfulness about what happened after the procedure. Impaired judgment for important decisions. Nausea or vomiting. Some difficulty with balance. Follow these instructions at home: For the time period you were told by your health care provider:     Rest as needed. Do not participate in activities where you could fall or become injured. Do not drive or use machinery. Do not drink alcohol. Do not take sleeping pills or medicines that cause drowsiness. Do not make important  decisions or sign legal documents. Do not take care of children on your own. Eating and drinking Follow the diet that is recommended by your health care provider. Drink enough fluid to keep your urine pale yellow. If you vomit: Drink water, juice, or soup when you can drink without vomiting. Make sure you have little or no nausea before eating solid foods. General instructions Have a responsible adult stay with you for the time you are told. It is important to have someone help care for you until you are awake and alert. Take over-the-counter and prescription medicines only as told by your health care provider. If you have sleep apnea, surgery and certain medicines can increase your risk for breathing problems. Follow instructions from your health care provider about wearing your sleep device: Anytime you are sleeping, including during daytime naps. While taking prescription pain medicines, sleeping medicines, or medicines that make you drowsy. Avoid smoking. Keep all follow-up visits as told by your health care provider. This is important. Contact a health care provider if: You keep feeling nauseous or you keep vomiting. You feel light-headed. You are still sleepy or having trouble with balance after 24 hours. You develop a rash. You have a fever. You have redness or swelling around the IV site. Get help right away if: You have trouble breathing. You have new-onset confusion at home. Summary For several hours after your procedure, you may feel tired. You may also be forgetful and have poor judgment. Have a responsible adult stay with you for the time you are told. It is important to have someone help care for you until you are awake and alert. Rest as told. Do not drive or operate machinery. Do not drink alcohol or take sleeping pills. Get help right away if you have trouble breathing, or if you suddenly become confused. This information is not intended to replace advice given to you  by your health care provider. Make sure you discuss any questions you have with your healthcare provider. Document Revised: 03/10/2020 Document Reviewed: 05/28/2019 Elsevier Patient Education  2022 [employer].

## 2021-02-24 ENCOUNTER — Encounter (HOSPITAL_COMMUNITY)
Admission: RE | Admit: 2021-02-24 | Discharge: 2021-02-24 | Disposition: A | Discharge status: Home / Self Care | Payer: Medicaid Other | Source: Ambulatory Visit | Attending: Gastroenterology | Admitting: Gastroenterology

## 2021-02-24 ENCOUNTER — Other Ambulatory Visit: Payer: Self-pay

## 2021-02-28 ENCOUNTER — Ambulatory Visit (HOSPITAL_COMMUNITY): Payer: Medicaid Other | Admitting: Anesthesiology

## 2021-02-28 ENCOUNTER — Encounter (HOSPITAL_COMMUNITY)
Admission: RE | Disposition: A | Discharge status: Home / Self Care | Payer: Self-pay | Source: Home / Self Care | Attending: Gastroenterology

## 2021-02-28 ENCOUNTER — Other Ambulatory Visit: Payer: Self-pay

## 2021-02-28 ENCOUNTER — Encounter (HOSPITAL_COMMUNITY): Payer: Self-pay | Admitting: Gastroenterology

## 2021-02-28 ENCOUNTER — Ambulatory Visit (HOSPITAL_COMMUNITY)
Admission: RE | Admit: 2021-02-28 | Discharge: 2021-02-28 | Disposition: A | Discharge status: Home / Self Care | Payer: Medicaid Other | Attending: Gastroenterology | Admitting: Gastroenterology

## 2021-02-28 DIAGNOSIS — R112 Nausea with vomiting, unspecified: Secondary | ICD-10-CM | POA: Diagnosis not present

## 2021-02-28 DIAGNOSIS — I509 Heart failure, unspecified: Secondary | ICD-10-CM | POA: Diagnosis not present

## 2021-02-28 DIAGNOSIS — Z880 Allergy status to penicillin: Secondary | ICD-10-CM | POA: Diagnosis not present

## 2021-02-28 DIAGNOSIS — Z79890 Hormone replacement therapy (postmenopausal): Secondary | ICD-10-CM | POA: Diagnosis not present

## 2021-02-28 DIAGNOSIS — K2980 Duodenitis without bleeding: Secondary | ICD-10-CM

## 2021-02-28 DIAGNOSIS — K2100 Gastro-esophageal reflux disease with esophagitis, without bleeding: Secondary | ICD-10-CM | POA: Diagnosis not present

## 2021-02-28 DIAGNOSIS — Z7902 Long term (current) use of antithrombotics/antiplatelets: Secondary | ICD-10-CM | POA: Diagnosis not present

## 2021-02-28 DIAGNOSIS — Z992 Dependence on renal dialysis: Secondary | ICD-10-CM | POA: Insufficient documentation

## 2021-02-28 DIAGNOSIS — Z87891 Personal history of nicotine dependence: Secondary | ICD-10-CM | POA: Diagnosis not present

## 2021-02-28 DIAGNOSIS — N186 End stage renal disease: Secondary | ICD-10-CM | POA: Diagnosis not present

## 2021-02-28 DIAGNOSIS — Z955 Presence of coronary angioplasty implant and graft: Secondary | ICD-10-CM | POA: Insufficient documentation

## 2021-02-28 DIAGNOSIS — K3189 Other diseases of stomach and duodenum: Secondary | ICD-10-CM

## 2021-02-28 DIAGNOSIS — Z79899 Other long term (current) drug therapy: Secondary | ICD-10-CM | POA: Insufficient documentation

## 2021-02-28 DIAGNOSIS — Z8673 Personal history of transient ischemic attack (TIA), and cerebral infarction without residual deficits: Secondary | ICD-10-CM | POA: Diagnosis not present

## 2021-02-28 DIAGNOSIS — K529 Noninfective gastroenteritis and colitis, unspecified: Secondary | ICD-10-CM

## 2021-02-28 DIAGNOSIS — E1122 Type 2 diabetes mellitus with diabetic chronic kidney disease: Secondary | ICD-10-CM | POA: Diagnosis not present

## 2021-02-28 DIAGNOSIS — Z7982 Long term (current) use of aspirin: Secondary | ICD-10-CM | POA: Insufficient documentation

## 2021-02-28 DIAGNOSIS — Z91041 Radiographic dye allergy status: Secondary | ICD-10-CM | POA: Insufficient documentation

## 2021-02-28 DIAGNOSIS — Z794 Long term (current) use of insulin: Secondary | ICD-10-CM | POA: Diagnosis not present

## 2021-02-28 DIAGNOSIS — Z01812 Encounter for preprocedural laboratory examination: Secondary | ICD-10-CM

## 2021-02-28 DIAGNOSIS — K2270 Barrett's esophagus without dysplasia: Secondary | ICD-10-CM

## 2021-02-28 DIAGNOSIS — I252 Old myocardial infarction: Secondary | ICD-10-CM | POA: Insufficient documentation

## 2021-02-28 DIAGNOSIS — I2510 Atherosclerotic heart disease of native coronary artery without angina pectoris: Secondary | ICD-10-CM | POA: Diagnosis not present

## 2021-02-28 DIAGNOSIS — K319 Disease of stomach and duodenum, unspecified: Secondary | ICD-10-CM | POA: Diagnosis not present

## 2021-02-28 DIAGNOSIS — I132 Hypertensive heart and chronic kidney disease with heart failure and with stage 5 chronic kidney disease, or end stage renal disease: Secondary | ICD-10-CM | POA: Insufficient documentation

## 2021-02-28 HISTORY — PX: COLONOSCOPY WITH PROPOFOL: SHX5780

## 2021-02-28 HISTORY — PX: ESOPHAGOGASTRODUODENOSCOPY (EGD) WITH PROPOFOL: SHX5813

## 2021-02-28 HISTORY — PX: BIOPSY: SHX5522

## 2021-02-28 LAB — POCT I-STAT, CHEM 8
BUN: 35 mg/dL — ABNORMAL HIGH (ref 6–20)
Calcium, Ion: 0.97 mmol/L — ABNORMAL LOW (ref 1.15–1.40)
Chloride: 99 mmol/L (ref 98–111)
Creatinine, Ser: 5.9 mg/dL — ABNORMAL HIGH (ref 0.44–1.00)
Glucose, Bld: 149 mg/dL — ABNORMAL HIGH (ref 70–99)
HCT: 32 % — ABNORMAL LOW (ref 36.0–46.0)
Hemoglobin: 10.9 g/dL — ABNORMAL LOW (ref 12.0–15.0)
Potassium: 5.1 mmol/L (ref 3.5–5.1)
Sodium: 136 mmol/L (ref 135–145)
TCO2: 28 mmol/L (ref 22–32)

## 2021-02-28 LAB — GLUCOSE, CAPILLARY: Glucose-Capillary: 135 mg/dL — ABNORMAL HIGH (ref 70–99)

## 2021-02-28 SURGERY — COLONOSCOPY WITH PROPOFOL
Anesthesia: General

## 2021-02-28 MED ORDER — ONDANSETRON HCL 4 MG/2ML IJ SOLN
4.0000 mg | Freq: Once | INTRAMUSCULAR | Status: AC
  Administered 2021-02-28: 4 mg via INTRAVENOUS

## 2021-02-28 MED ORDER — PROPOFOL 10 MG/ML IV BOLUS
INTRAVENOUS | Status: DC | PRN
Start: 2021-02-28 — End: 2021-02-28
  Administered 2021-02-28 (×6): 40 mg via INTRAVENOUS
  Administered 2021-02-28: 100 mg via INTRAVENOUS

## 2021-02-28 MED ORDER — ONDANSETRON HCL 4 MG/2ML IJ SOLN
INTRAMUSCULAR | Status: DC | PRN
  Administered 2021-02-28: 4 mg via INTRAVENOUS

## 2021-02-28 MED ORDER — ONDANSETRON HCL 4 MG/2ML IJ SOLN
INTRAMUSCULAR | Status: AC
  Filled 2021-02-28: qty 2

## 2021-02-28 MED ORDER — DIPHENHYDRAMINE HCL 50 MG/ML IJ SOLN
25.0000 mg | Freq: Once | INTRAMUSCULAR | Status: AC
  Administered 2021-02-28: 25 mg via INTRAVENOUS

## 2021-02-28 MED ORDER — SODIUM CHLORIDE 0.9 % IV SOLN: INTRAVENOUS | Status: DC

## 2021-02-28 MED ORDER — OMEPRAZOLE 40 MG PO CPDR: 40.0000 mg | DELAYED_RELEASE_CAPSULE | Freq: Every day | ORAL | 3 refills | Status: AC

## 2021-02-28 MED ORDER — LIDOCAINE HCL (CARDIAC) PF 100 MG/5ML IV SOSY
PREFILLED_SYRINGE | INTRAVENOUS | Status: DC | PRN
  Administered 2021-02-28: 50 mg via INTRAVENOUS

## 2021-02-28 MED ORDER — SODIUM CHLORIDE 0.9 % IV SOLN: INTRAVENOUS | Status: DC | PRN

## 2021-02-28 MED ORDER — DIPHENHYDRAMINE HCL 50 MG/ML IJ SOLN
INTRAMUSCULAR | Status: AC
  Filled 2021-02-28: qty 1

## 2021-02-28 NOTE — Anesthesia Preprocedure Evaluation (Signed)
Anesthesia Evaluation  Patient identified by MRN, date of birth, ID band Patient awake    Reviewed: Allergy & Precautions, NPO status , Patient's Chart, lab work & pertinent test results, reviewed documented beta blocker date and time   History of Anesthesia Complications Negative for: history of anesthetic complications  Airway Mallampati: II  TM Distance: >3 FB Neck ROM: Full    Dental  (+) Dental Advisory Given, Missing, Chipped   Pulmonary former smoker,    Pulmonary exam normal breath sounds clear to auscultation       Cardiovascular Exercise Tolerance: Poor hypertension, Pt. on home beta blockers and Pt. on medications + CAD and + Past MI  Cardiac stents: 10/2019.  Normal cardiovascular exam Rhythm:Regular Rate:Normal  Nuclear stress EF: 54%. The left ventricular ejection fraction is mildly decreased (45-54%). There was no ST segment deviation noted during stress. the study is normal. This is a low risk study.   Normal resting and stress perfusion. No ischemia or infarction EF 54%    Neuro/Psych  Headaches, PSYCHIATRIC DISORDERS Depression  Neuromuscular disease CVA, Residual Symptoms    GI/Hepatic GERD  Medicated,  Endo/Other  diabetes, Poorly Controlled, Type 2, Insulin DependentHypothyroidism   Renal/GU ESRF and DialysisRenal disease     Musculoskeletal  (+) Arthritis ,   Abdominal   Peds  Hematology  (+) anemia ,   Anesthesia Other Findings   Reproductive/Obstetrics                          Anesthesia Physical Anesthesia Plan  ASA: 3  Anesthesia Plan: General   Post-op Pain Management:    Induction: Intravenous  PONV Risk Score and Plan: Propofol infusion  Airway Management Planned: Nasal Cannula and Natural Airway  Additional Equipment:   Intra-op Plan:   Post-operative Plan:   Informed Consent: I have reviewed the patients History and Physical, chart, labs  and discussed the procedure including the risks, benefits and alternatives for the proposed anesthesia with the patient or authorized representative who has indicated his/her understanding and acceptance.     Dental advisory given  Plan Discussed with: CRNA and Surgeon  Anesthesia Plan Comments:        Anesthesia Quick Evaluation

## 2021-02-28 NOTE — Anesthesia Postprocedure Evaluation (Signed)
Anesthesia Post Note  Patient: Melanie Hall  Procedure(s) Performed: COLONOSCOPY WITH PROPOFOL ESOPHAGOGASTRODUODENOSCOPY (EGD) WITH PROPOFOL BIOPSY  Patient location during evaluation: Phase II Anesthesia Type: General Level of consciousness: awake and alert and oriented Pain management: pain level controlled Vital Signs Assessment: post-procedure vital signs reviewed and stable Respiratory status: spontaneous breathing and respiratory function stable Cardiovascular status: blood pressure returned to baseline and stable Postop Assessment: no apparent nausea or vomiting Anesthetic complications: no   No notable events documented.   Last Vitals:  Vitals:   02/28/21 1113 02/28/21 1116  BP: 129/65   Pulse: 73   Resp: 16   Temp:  36.9 C  SpO2: 100%     Last Pain:  Vitals:   02/28/21 1116  TempSrc: Oral  PainSc:                  Emmer Lillibridge C Perfecto Purdy

## 2021-02-28 NOTE — Discharge Instructions (Addendum)
You are being discharged to home.  Resume your previous diet.  We are waiting for your pathology results.  Restart Brillinta today. - Start omeprazole 40 mg qday. Your physician has recommended a repeat colonoscopy at the next available appointment for screening purposes, if able to tolerate bowel prep.

## 2021-02-28 NOTE — H&P (Signed)
Melanie Hall is an 50 y.o. female.   Chief Complaint: nausea/vomiting and chronic diarrhea HPI: Melanie Hall is a 50 y.o. female with past medical history of diabetes complicated by end-stage renal disease on hemodialysis, coronary artery disease and MI status post multiple stents in March 2021 on dual antiplatelet therapy, heart failure, GERD, hypertension, stroke, who presents for follow up of chronic diarrhea, nausea and vomiting.  The patient reports that she has presented persistent diarrhea every time she eats.  He describes having 3-4 bowel movements per day which are watery in nature.  Has recurrent nausea and vomiting episodes of unclear etiology that have been going on for at least the last 2 years.  Stool testing was ordered but has not been collected.  TSH was normal.  She was started on colestipol before.  Past Medical History:  Diagnosis Date   Acute cystitis without hematuria    Acute midline thoracic back pain 88/89/1694   Acute systolic heart failure ([place]) 10/21/2019   Anemia    Arthritis    CAD (coronary artery disease)    S/p late presenting Inf STEMI 10/2019 >> PCI: DES x 2 to RCA // Myoview 8/22: EF 54, no ischemia or infarction; low risk   Cataracts, bilateral    surgery to remove   CATARACTS, BILATERAL 07/02/2007   Qualifier: Diagnosis of  By: Isla Pence     Closed fracture of left femur ([place]) 10/28/2018   Closed fracture of right ankle 11/06/2017   Diabetes mellitus    type 2   Emphysematous cystitis 08/26/2018   Encounter for gynecological examination with Papanicolaou smear of cervix 01/14/2020   Encounter for screening fecal occult blood testing 01/14/2020   Encounter for screening for malignant neoplasm of cervix 12/29/2019   Encounter for screening mammogram for malignant neoplasm of breast 12/29/2019   ESRD on hemodialysis ([place])    MWF - in [place]   GERD (gastroesophageal reflux disease)    Hyperlipidemia    Hypertension    Hypokalemia  08/26/2018   IRREGULAR MENSES 09/14/2009   Qualifier: Diagnosis of  By: Hassell Done FNP, [place]     Ischemic cardiomyopathy    Echocardiogram 10/19/2019: EF 40-45, inf WMA, Gr 1 DD, Lg L pl Eff // EF 54 by Myoview in 8/22   Loose stools 11/12/2019   Normocytic anemia 08/26/2018   PARONYCHIA, RIGHT GREAT TOE 07/30/2008   Qualifier: Diagnosis of  By: Hassell Done FNP, [place]     Pressure ulcer 09/10/2019   Right arm weakness 08/08/2019   Sprain of left ankle    STEMI (ST elevation myocardial infarction) ([place]) 10/18/2019   STEMI involving right coronary artery ([place]) 10/18/2019   Stroke ([place]) 10/2019   SVD (spontaneous vaginal delivery)    x 4   Vaginosis 08/26/2018   Weakness 09/10/2019   Weakness of both lower extremities     Past Surgical History:  Procedure Laterality Date   A/V FISTULAGRAM Left 01/12/2020   Procedure: A/V FISTULAGRAM;  Surgeon: Serafina Mitchell, MD;  Location: [place] [facility];  Service: Cardiovascular;  Laterality: Left;   A/V FISTULAGRAM N/A 03/17/2020   Procedure: A/V FISTULAGRAM - Left Arm;  Surgeon: Marty Heck, MD;  Location: [place] [facility];  Service: Cardiovascular;  Laterality: N/A;   A/V FISTULAGRAM Left 06/23/2020   Procedure: A/V FISTULAGRAM;  Surgeon: Marty Heck, MD;  Location: [place] [facility];  Service: Cardiovascular;  Laterality: Left;   ANKLE FUSION Left 06/01/2020   Procedure: LEFT ANKLE FUSION;  Surgeon: Newt Minion, MD;  Location: [facility];  Service: Orthopedics;  Laterality: Left;   AV FISTULA PLACEMENT Left 08/17/2019   Procedure: LEFT BRACHIAL CEPHALIC ARTERIOVENOUS (AV) FISTULA;  Surgeon: Angelia Mould, MD;  Location: [facility];  Service: Vascular;  Laterality: Left;   CORONARY STENT INTERVENTION N/A 10/18/2019   Procedure: CORONARY STENT INTERVENTION;  Surgeon: Sherren Mocha, MD;  Location: [facility];  Service: Cardiovascular;  Laterality: N/A;   CORONARY/GRAFT ACUTE MI REVASCULARIZATION N/A 10/18/2019    Procedure: Coronary/Graft Acute MI Revascularization;  Surgeon: Sherren Mocha, MD;  Location: [facility] [place] [facility];  Service: Cardiovascular;  Laterality: N/A;   EYE SURGERY Bilateral    cataracts removed   FEMUR IM NAIL Left 10/28/2018   Procedure: RETROGRADE FEMORAL NAILING;  Surgeon: Meredith Pel, MD;  Location: [facility];  Service: Orthopedics;  Laterality: Left;   IM NAILING FEMORAL SHAFT RETROGRADE Left 10/28/2018   INTRAVASCULAR ULTRASOUND/IVUS N/A 10/18/2019   Procedure: Intravascular Ultrasound/IVUS;  Surgeon: Sherren Mocha, MD;  Location: [place] [facility];  Service: Cardiovascular;  Laterality: N/A;   IR FLUORO GUIDE CV LINE RIGHT  08/11/2019   IR THORACENTESIS ASP PLEURAL SPACE W/IMG GUIDE  11/12/2019   IR US GUIDE VASC ACCESS RIGHT  08/11/2019   KNEE SURGERY Left    LEFT HEART CATH AND CORONARY ANGIOGRAPHY N/A 10/18/2019   Procedure: LEFT HEART CATH AND CORONARY ANGIOGRAPHY;  Surgeon: Sherren Mocha, MD;  Location: [facility];  Service: Cardiovascular;  Laterality: N/A;   PERIPHERAL VASCULAR BALLOON ANGIOPLASTY Left 01/12/2020   Procedure: PERIPHERAL VASCULAR BALLOON ANGIOPLASTY;  Surgeon: Serafina Mitchell, MD;  Location: [facility];  Service: Cardiovascular;  Laterality: Left;  AVF   PERIPHERAL VASCULAR BALLOON ANGIOPLASTY Left 03/17/2020   Procedure: PERIPHERAL VASCULAR BALLOON ANGIOPLASTY;  Surgeon: Marty Heck, MD;  Location: [facility];  Service: Cardiovascular;  Laterality: Left;  AVF   PERIPHERAL VASCULAR BALLOON ANGIOPLASTY Left 05/05/2020   Procedure: PERIPHERAL VASCULAR BALLOON ANGIOPLASTY;  Surgeon: Marty Heck, MD;  Location: [place] [facility];  Service: Cardiovascular;  Laterality: Left;  arm fistula   RADIOLOGY WITH ANESTHESIA N/A 09/15/2019   Procedure: [facility] AND LUMBER LOWER BACK PAIN;  Surgeon: Radiologist, Medication, MD;  Location: [place];  Service: Radiology;  Laterality: N/A;   TUBAL LIGATION      Family History  Problem  Relation Age of Onset   Diabetes Mother    Hypertension Mother    Bipolar disorder Mother    Brain cancer Maternal Aunt    Heart disease Maternal Grandmother    Diabetes Maternal Grandmother    Breast cancer Maternal Aunt    Depression Father        Committed suicide   Diabetes Sister    Social History:  reports that she quit smoking about 25 years ago. Her smoking use included cigarettes. She has a 0.50 pack-year smoking history. She has never used smokeless tobacco. She reports that she does not drink alcohol and does not use drugs.  Allergies:  Allergies  Allergen Reactions   Contrast Media [Iodinated Diagnostic Agents] Nausea And Vomiting    Treated with Benadryl & Solumedrol   Penicillins Other (See Comments)    Don't want to take PCN due to family history     Facility-Administered Medications Prior to Admission  Medication Dose Route Frequency Provider Last Rate Last Admin   0.9 %  sodium chloride infusion  250 mL Intravenous PRN Marty Heck, MD  0.9 %  sodium chloride infusion  250 mL Intravenous PRN Marty Heck, MD       sodium chloride flush (NS) 0.9 % injection 3 mL  3 mL Intravenous Q12H Marty Heck, MD       sodium chloride flush (NS) 0.9 % injection 3 mL  3 mL Intravenous PRN Marty Heck, MD       Medications Prior to Admission  Medication Sig Dispense Refill   aspirin 81 MG EC tablet Take 1 tablet (81 mg total) by mouth daily. 90 tablet 3   atorvastatin (LIPITOR) 80 MG tablet Take 1 tablet (80 mg total) by mouth daily at 6 PM. 90 tablet 3   BRILINTA 90 MG TABS tablet Take 1 tablet by mouth twice daily 60 tablet 0   carvedilol (COREG) 12.5 MG tablet Take 1 tablet (12.5 mg total) by mouth 2 (two) times daily. 180 tablet 3   colestipol (COLESTID) 1 g tablet Take 1 tablet (1 g total) by mouth 2 (two) times daily. 180 tablet 3   ezetimibe (ZETIA) 10 MG tablet Take 1 tablet (10 mg total) by mouth daily. 90 tablet 3   gabapentin  (NEURONTIN) 300 MG capsule Take 1 capsule (300 mg total) by mouth 3 (three) times daily. 90 capsule 0   hydrALAZINE (APRESOLINE) 10 MG tablet Take 1 tablet (10 mg total) by mouth in the morning and at bedtime. 180 tablet 3   insulin aspart protamine- aspart (NOVOLOG MIX 70/30) (70-30) 100 UNIT/ML injection Inject 0.1 mLs (10 Units total) into the skin 2 (two) times daily with a meal. 10 mL 3   levothyroxine (SYNTHROID) 50 MCG tablet Take 1 tablet (50 mcg total) by mouth daily before breakfast. 30 tablet 1   lidocaine-prilocaine (EMLA) cream Apply topically.     methocarbamol (ROBAXIN) 500 MG tablet Take 1 tablet (500 mg total) by mouth every 8 (eight) hours as needed for muscle spasms. 45 tablet 1   multivitamin (RENA-VIT) TABS tablet Take 1 tablet by mouth at bedtime. 30 tablet 0   Na Sulfate-K Sulfate-Mg Sulf (SUPREP BOWEL PREP KIT) 17.5-3.13-1.6 GM/177ML SOLN Take 1 kit by mouth as directed. 354 mL 0   ondansetron (ZOFRAN ODT) 4 MG disintegrating tablet Take 1 tablet (4 mg total) by mouth every 8 (eight) hours as needed for nausea or vomiting. 15 tablet 0   silver sulfADIAZINE (SILVADENE) 1 % cream Apply 1 application topically daily. 50 g 0   tamsulosin (FLOMAX) 0.4 MG CAPS capsule Take 1 capsule (0.4 mg total) by mouth daily after supper. 90 capsule 0   traMADol (ULTRAM) 50 MG tablet Take 1 tablet (50 mg total) by mouth daily as needed. 30 tablet 5   epoetin alfa (EPOGEN) 3000 UNIT/ML injection Inject 3,000 Units into the skin every 14 (fourteen) days.      isosorbide mononitrate (IMDUR) 30 MG 24 hr tablet Take 0.5 tablets (15 mg total) by mouth daily. 15 tablet 11   nitroGLYCERIN (NITROSTAT) 0.4 MG SL tablet Place 1 tablet (0.4 mg total) under the tongue every 5 (five) minutes x 3 doses as needed for chest pain. 25 tablet 3    Results for orders placed or performed during the hospital encounter of 02/28/21 (from the past 48 hour(s))  I-STAT, chem 8     Status: Abnormal   Collection Time:  02/28/21  9:28 AM  Result Value Ref Range   Sodium 136 135 - 145 mmol/L   Potassium 5.1 3.5 - 5.1 mmol/L  Chloride 99 98 - 111 mmol/L   BUN 35 (H) 6 - 20 mg/dL   Creatinine, Ser 5.90 (H) 0.44 - 1.00 mg/dL   Glucose, Bld 149 (H) 70 - 99 mg/dL    Comment: Glucose reference range applies only to samples taken after fasting for at least 8 hours.   Calcium, Ion 0.97 (L) 1.15 - 1.40 mmol/L   TCO2 28 22 - 32 mmol/L   Hemoglobin 10.9 (L) 12.0 - 15.0 g/dL   HCT 32.0 (L) 36.0 - 46.0 %   No results found.  Review of Systems  Constitutional: Negative.   HENT: Negative.    Eyes: Negative.   Respiratory: Negative.    Cardiovascular: Negative.   Gastrointestinal:  Positive for diarrhea.  Endocrine: Negative.   Genitourinary: Negative.   Musculoskeletal: Negative.   Skin: Negative.   Allergic/Immunologic: Negative.   Neurological: Negative.   Hematological: Negative.   Psychiatric/Behavioral: Negative.     Blood pressure 131/62, pulse 72, temperature 97.9 F (36.6 C), temperature source Oral, resp. rate 14, height '5\' 3"'  (1.6 m), weight 68.5 kg, last menstrual period 12/17/2016, SpO2 97 %. Physical Exam  GENERAL: The patient is AO x3, in no acute distress. HEENT: Head is normocephalic and atraumatic. EOMI are intact. Mouth is well hydrated and without lesions. NECK: Supple. No masses LUNGS: Clear to auscultation. No presence of rhonchi/wheezing/rales. Adequate chest expansion HEART: RRR, normal s1 and s2. ABDOMEN: Soft, nontender, no guarding, no peritoneal signs, and nondistended. BS +. No masses. EXTREMITIES: Without any cyanosis, clubbing, rash, lesions or edema. NEUROLOGIC: AOx3, no focal motor deficit. SKIN: no jaundice, no rashes  Assessment/Plan Melanie Hall is a 50 y.o. female with past medical history of diabetes complicated by end-stage renal disease on hemodialysis, coronary artery disease and MI status post multiple stents in March 2021 on dual antiplatelet therapy, heart  failure, GERD, hypertension, stroke, who presents for follow up of chronic diarrhea, nausea and vomiting.  We will proceed with EGD and colonoscopy.  Harvel Quale, MD 02/28/2021, 10:24 AM

## 2021-02-28 NOTE — Op Note (Signed)
[facility] Patient Name: Melanie Hall Procedure Date: 02/28/2021 10:22 AM MRN: [id number] Date of Birth: 1970-07-30 Attending MD: Maylon Peppers ,  CSN: [id number] Age: 50 Admit Type: Outpatient Procedure:                Upper GI endoscopy Indications:              Nausea with vomiting Providers:                Maylon Peppers, [place] Sharon Seller, RN, Aram Candela Referring MD:              Medicines:                Monitored Anesthesia Care Complications:            No immediate complications. Estimated Blood Loss:     Estimated blood loss: none. Procedure:                Pre-Anesthesia Assessment:                           - Prior to the procedure, a History and Physical                            was performed, and patient medications, allergies                            and sensitivities were reviewed. The patient's                            tolerance of previous anesthesia was reviewed.                           - The risks and benefits of the procedure and the                            sedation options and risks were discussed with the                            patient. All questions were answered and informed                            consent was obtained.                           - ASA Grade Assessment: III - A patient with severe                            systemic disease.                           After obtaining informed consent, the endoscope was                            passed under direct vision. Throughout the  procedure, the patient's blood pressure, pulse, and                            oxygen saturations were monitored continuously. The                            [id number]-[id number] ([id number]) scope was introduced through the                            mouth, and advanced to the second part of duodenum.                            The upper GI endoscopy was accomplished without                             difficulty. The patient tolerated the procedure                            well. Scope In: 10:36:38 AM Scope Out: 10:51:53 AM Total Procedure Duration: 0 hours 15 minutes 15 seconds  Findings:      LA Grade B (one or more mucosal breaks greater than 5 mm, not extending       between the tops of two mucosal folds) esophagitis with no bleeding was       found in the middle third of the esophagus.      The esophagus and gastroesophageal junction were examined with white       light and narrow band imaging (NBI). There were esophageal mucosal       changes suspicious for long-segment Barrett's esophagus, classified as       Barrett's stage C4-M4 per Prague criteria. These changes involved the       mucosa at the upper extent of the gastric folds (35 cm from the       incisors) extending to the Z-line (31 cm from the incisors). The maximum       longitudinal extent of these esophageal mucosal changes was 4 cm in       length. Mucosa was biopsied with a cold forceps for histology in 4       quadrants at intervals of 1 cm. A total of 5 specimen bottles were sent       to pathology.      A few localized small erosions with no stigmata of recent bleeding were       found in the gastric antrum. Biopsies were taken with a cold forceps for       Helicobacter pylori testing.      Diffuse moderate inflammation characterized by congestion (edema),       erosions and erythema was found in the duodenal bulb.      The exam of the duodenum was otherwise normal. Impression:               - LA Grade B reflux esophagitis with no bleeding.                           - Esophageal mucosal changes suspicious for  long-segment Barrett's esophagus, classified as                            Barrett's stage C4-M4 per Prague criteria. Biopsied.                           - Erosive gastropathy with no stigmata of recent                            bleeding. Biopsied.                           -  Duodenitis. Moderate Sedation:      Per Anesthesia Care Recommendation:           - Discharge patient to home (ambulatory).                           - Resume previous diet.                           - Await pathology results.                           - Restart Brillinta today.                           - Start omeprazole 40 mg qday. Procedure Code(s):        --- Professional ---                           [phone], Esophagogastroduodenoscopy, flexible,                            transoral; with biopsy, single or multiple Diagnosis Code(s):        --- Professional ---                           K21.00, Gastro-esophageal reflux disease with                            esophagitis, without bleeding                           K22.70, Barrett's esophagus without dysplasia                           K31.89, Other diseases of stomach and duodenum                           K29.80, Duodenitis without bleeding                           R11.2, Nausea with vomiting, unspecified CPT copyright 2019 American Medical Association. All rights reserved. The codes documented in this report are preliminary and upon coder review may  be revised to meet current compliance requirements. Maylon Peppers, MD Maylon Peppers,  02/28/2021 11:14:42 AM This report has been signed electronically. Number of Addenda:  0 

## 2021-02-28 NOTE — Transfer of Care (Signed)
Immediate Anesthesia Transfer of Care Note  Patient: Melanie Hall  Procedure(s) Performed: COLONOSCOPY WITH PROPOFOL ESOPHAGOGASTRODUODENOSCOPY (EGD) WITH PROPOFOL BIOPSY  Patient Location: Short Stay  Anesthesia Type:General  Level of Consciousness: awake  Airway & Oxygen Therapy: Patient Spontanous Breathing  Post-op Assessment: Report given to RN and Post -op Vital signs reviewed and stable  Post vital signs: Reviewed and stable  Last Vitals:  Vitals Value Taken Time  BP 129/65 02/28/21 1113  Temp    Pulse 73 02/28/21 1113  Resp 16 02/28/21 1113  SpO2 100 % 02/28/21 1113    Last Pain:  Vitals:   02/28/21 1113  TempSrc: Axillary  PainSc: 0-No pain         Complications: No notable events documented.

## 2021-02-28 NOTE — Op Note (Signed)
[facility] Patient Name: Melanie Hall Procedure Date: 02/28/2021 10:54 AM MRN: [id number] Date of Birth: 12-31-1970 Attending MD: Maylon Peppers ,  CSN: [id number] Age: 50 Admit Type: Outpatient Procedure:                Colonoscopy Indications:              Chronic diarrhea Providers:                Maylon Peppers, Gwenlyn Fudge RN, RN, Aram Candela Referring MD:              Medicines:                Monitored Anesthesia Care Complications:            No immediate complications. Estimated Blood Loss:     Estimated blood loss: none. Procedure:                Pre-Anesthesia Assessment:                           - Prior to the procedure, a History and Physical                            was performed, and patient medications, allergies                            and sensitivities were reviewed. The patient's                            tolerance of previous anesthesia was reviewed.                           - The risks and benefits of the procedure and the                            sedation options and risks were discussed with the                            patient. All questions were answered and informed                            consent was obtained.                           - ASA Grade Assessment: III - A patient with severe                            systemic disease.                           After obtaining informed consent, the colonoscope                            was passed under direct vision. Throughout the                            procedure, the patient's blood pressure, pulse, and  oxygen saturations were monitored continuously. The                            [id number]-[id number] ([id number] scope was introduced through                            the anus and advanced to the the cecum, identified                            by appendiceal orifice and ileocecal valve. The                            colonoscopy was performed without difficulty.  The                            patient tolerated the procedure well. The quality                            of the bowel preparation was poor. Scope In: 10:56:35 AM Scope Out: 11:08:50 AM Scope Withdrawal Time: 0 hours 6 minutes 25 seconds  Total Procedure Duration: 0 hours 12 minutes 15 seconds  Findings:      The perianal and digital rectal examinations were normal. There was hard       stool in the rectal vault.      Extensive amounts of solid stool was found in the entire colon,       interfering with visualization.      The parts of the colon that could be examined due to the interfering       stool appeared normal. Biopsies for histology were taken with a cold       forceps from the right colon and left colon for evaluation of       microscopic colitis.      The retroflexed view of the distal rectum and anal verge was normal and       showed no anal or rectal abnormalities. Impression:               - Preparation of the colon was poor.                           - Stool in the entire examined colon.                           - The parts of the colon that could be examined due                            to the interfering stool appeared normal. Biopsied.                           - The distal rectum and anal verge are normal on                            retroflexion view. Moderate Sedation:      Per Anesthesia Care Recommendation:           - Discharge patient to home (ambulatory).                           -  Resume previous diet.                           - Await pathology results.                           - Repeat colonoscopy at the next available                            appointment for screening purposes, if able to                            tolerate bowel prep. Procedure Code(s):        --- Professional ---                           [phone], Colonoscopy, flexible; with biopsy, single                            or multiple Diagnosis Code(s):        --- Professional ---                            K52.9, Noninfective gastroenteritis and colitis,                            unspecified CPT copyright 2019 American Medical Association. All rights reserved. The codes documented in this report are preliminary and upon coder review may  be revised to meet current compliance requirements. Maylon Peppers, MD Maylon Peppers,  02/28/2021 11:23:44 AM This report has been signed electronically. Number of Addenda: 0

## 2021-03-01 LAB — SURGICAL PATHOLOGY

## 2021-03-02 LAB — POTASSIUM, STOOL: Potassium, Feces: 35.1 mEq/L

## 2021-03-02 LAB — SODIUM, STOOL: Sodium, Feces: 115 mEq/L

## 2021-03-02 LAB — PANCREATIC ELASTASE, FECAL: Pancreatic Elastase-1, Stool: 410 mcg/g

## 2021-03-08 ENCOUNTER — Encounter (HOSPITAL_COMMUNITY): Payer: Self-pay | Admitting: Gastroenterology

## 2021-04-14 ENCOUNTER — Telehealth: Payer: Self-pay

## 2021-04-14 ENCOUNTER — Telehealth: Payer: Self-pay | Admitting: *Deleted

## 2021-04-14 NOTE — Telephone Encounter (Signed)
Patient calls today to report difficult cannulation and prolonged bleeding post HD. Talked with her and Ramiro Harvest PA - she has had multiple fistulograms and advised per last office visit with [facility] she would need to be evaluated for new access. PA thinks she needs a fistulogram  and has set that up with [facility] on Monday. Instructed to call back for office visit if it was determined new access was needed.

## 2021-04-14 NOTE — Telephone Encounter (Signed)
Prior auth for Tramadol submitted via [employer]. Confirmation #:[id number] WPrior Approval #:[id number]

## 2021-04-15 ENCOUNTER — Other Ambulatory Visit: Payer: Self-pay | Admitting: Nurse Practitioner

## 2021-04-15 DIAGNOSIS — E1121 Type 2 diabetes mellitus with diabetic nephropathy: Secondary | ICD-10-CM

## 2021-04-25 ENCOUNTER — Ambulatory Visit: Payer: Medicaid Other | Admitting: Physical Medicine & Rehabilitation

## 2021-05-11 NOTE — Telephone Encounter (Signed)
ERROR

## 2021-05-12 ENCOUNTER — Encounter: Payer: Medicaid Other | Attending: Physical Medicine & Rehabilitation | Admitting: Physical Medicine & Rehabilitation

## 2021-05-12 ENCOUNTER — Other Ambulatory Visit: Payer: Self-pay

## 2021-05-12 ENCOUNTER — Encounter: Payer: Self-pay | Admitting: Physical Medicine & Rehabilitation

## 2021-05-12 VITALS — BP 147/67 | HR 74 | Ht 63.0 in | Wt 168.4 lb

## 2021-05-12 DIAGNOSIS — M21371 Foot drop, right foot: Secondary | ICD-10-CM | POA: Diagnosis present

## 2021-05-12 DIAGNOSIS — M21372 Foot drop, left foot: Secondary | ICD-10-CM | POA: Diagnosis present

## 2021-05-12 DIAGNOSIS — E1121 Type 2 diabetes mellitus with diabetic nephropathy: Secondary | ICD-10-CM | POA: Insufficient documentation

## 2021-05-12 MED ORDER — TRAMADOL HCL 50 MG PO TABS: 50.0000 mg | ORAL_TABLET | Freq: Every day | ORAL | 5 refills | Status: DC | PRN

## 2021-05-12 MED ORDER — GABAPENTIN 300 MG PO CAPS: 300.0000 mg | ORAL_CAPSULE | Freq: Three times a day (TID) | ORAL | 5 refills | Status: AC

## 2021-05-12 NOTE — Patient Instructions (Addendum)
Do skin checks daily    Right foot ankle night brace PRAFO

## 2021-05-12 NOTE — Progress Notes (Signed)
Subjective:    Patient ID: Melanie Hall, female    DOB: 1971/03/21, 50 y.o.   MRN: [id number]  HPI  CC:  Neuropathy pain  50 yo with ESRD on HD here for f/u of chronic painful diabetic neuropathy.   No more sternal pain  Right hand fourth digit swelling but no pain, plans to follow-up with orthopedics on this. [name] is complaining that the right ankle starting get tight. Her lower extremity pain is described as sharp and throbbing.  Her worst pain is at nighttime which is when she takes her tramadol 50 mg.  During the day she takes gabapentin 300 mg 3 times daily  Pain Inventory Average Pain 10 Pain Right Now 0 My pain is sharp and throbbing  In the last 24 hours, has pain interfered with the following? General activity 10 Relation with others 10 Enjoyment of life 10 What TIME of day is your pain at its worst? morning , daytime, evening, and night Sleep (in general) Poor  Pain is worse with: some activites Pain improves with: medication Relief from Meds: 5  Family History  Problem Relation Age of Onset   Diabetes Mother    Hypertension Mother    Bipolar disorder Mother    Brain cancer Maternal Aunt    Heart disease Maternal Grandmother    Diabetes Maternal Grandmother    Breast cancer Maternal Aunt    Depression Father        Committed suicide   Diabetes Sister    Social History   Socioeconomic History   Marital status: Widowed    Spouse name: Not on file   Number of children: 4   Years of education: Not on file   Highest education level: 10th grade  Occupational History   Not on file  Tobacco Use   Smoking status: Former    Packs/day: 0.25    Years: 2.00    Pack years: 0.50    Types: Cigarettes    Quit date: 73    Years since quitting: 25.8   Smokeless tobacco: Never  Vaping Use   Vaping Use: Never used  Substance and Sexual Activity   Alcohol use: No   Drug use: No   Sexual activity: Yes    Birth control/protection: Surgical    Comment:  tubal   Other Topics Concern   Not on file  Social History Narrative   Lives with friend with [place]   Cats: [place], [place], [place], 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      Enjoy: watching TV-[employer], [employer], and [employer]      Diet: eats all food groups: focuses on Dm and Kidney friendly foods    Caffeine: none   Water: Fluid Restriction 32 ounces a day          Wears seat belt: not currently driving    Oceanographer at home    No weapons    Social Determinants of Health   Financial Resource Strain: Not on file  Food Insecurity: Not on file  Transportation Needs: Not on file  Physical Activity: Not on file  Stress: Not on file  Social Connections: Not on file   Past Surgical History:  Procedure Laterality Date   A/V FISTULAGRAM Left 01/12/2020   Procedure: A/V FISTULAGRAM;  Surgeon: Serafina Mitchell, MD;  Location: [place] [facility];  Service: Cardiovascular;  Laterality: Left;   A/V FISTULAGRAM N/A 03/17/2020   Procedure: A/V FISTULAGRAM - Left Arm;  Surgeon: Marty Heck, MD;  Location: [place]  [facility];  Service: Cardiovascular;  Laterality: N/A;   A/V FISTULAGRAM Left 06/23/2020   Procedure: A/V FISTULAGRAM;  Surgeon: Marty Heck, MD;  Location: [place] [facility];  Service: Cardiovascular;  Laterality: Left;   ANKLE FUSION Left 06/01/2020   Procedure: LEFT ANKLE FUSION;  Surgeon: Newt Minion, MD;  Location: [place];  Service: Orthopedics;  Laterality: Left;   AV FISTULA PLACEMENT Left 08/17/2019   Procedure: LEFT BRACHIAL CEPHALIC ARTERIOVENOUS (AV) FISTULA;  Surgeon: Angelia Mould, MD;  Location: [place];  Service: Vascular;  Laterality: Left;   BIOPSY  02/28/2021   Procedure: BIOPSY;  Surgeon: Harvel Quale, MD;  [facility];  Service: Gastroenterology;;  duodenum gastric esophagus colon   COLONOSCOPY WITH PROPOFOL N/A 02/28/2021   Procedure: COLONOSCOPY WITH PROPOFOL;  Surgeon: Harvel Quale, MD;  [facility];   Service: Gastroenterology;  Laterality: N/A;  11:00   CORONARY STENT INTERVENTION N/A 10/18/2019   Procedure: CORONARY STENT INTERVENTION;  Surgeon: Sherren Mocha, MD;  Location: [place] [facility];  Service: Cardiovascular;  Laterality: N/A;   CORONARY/GRAFT ACUTE MI REVASCULARIZATION N/A 10/18/2019   Procedure: Coronary/Graft Acute MI Revascularization;  Surgeon: Sherren Mocha, MD;  Location: [place] [facility];  Service: Cardiovascular;  Laterality: N/A;   ESOPHAGOGASTRODUODENOSCOPY (EGD) WITH PROPOFOL N/A 02/28/2021   Procedure: ESOPHAGOGASTRODUODENOSCOPY (EGD) WITH PROPOFOL;  Surgeon: Harvel Quale, MD;  [facility];  Service: Gastroenterology;  Laterality: N/A;   EYE SURGERY Bilateral    cataracts removed   FEMUR IM NAIL Left 10/28/2018   Procedure: RETROGRADE FEMORAL NAILING;  Surgeon: Meredith Pel, MD;  Location: [place];  Service: Orthopedics;  Laterality: Left;   IM NAILING FEMORAL SHAFT RETROGRADE Left 10/28/2018   INTRAVASCULAR ULTRASOUND/IVUS N/A 10/18/2019   Procedure: Intravascular Ultrasound/IVUS;  Surgeon: Sherren Mocha, MD;  Location: [place] [facility];  Service: Cardiovascular;  Laterality: N/A;   IR FLUORO GUIDE CV LINE RIGHT  08/11/2019   IR THORACENTESIS ASP PLEURAL SPACE W/IMG GUIDE  11/12/2019   IR US GUIDE VASC ACCESS RIGHT  08/11/2019   KNEE SURGERY Left    LEFT HEART CATH AND CORONARY ANGIOGRAPHY N/A 10/18/2019   Procedure: LEFT HEART CATH AND CORONARY ANGIOGRAPHY;  Surgeon: Sherren Mocha, MD;  Location: [place] [facility];  Service: Cardiovascular;  Laterality: N/A;   PERIPHERAL VASCULAR BALLOON ANGIOPLASTY Left 01/12/2020   Procedure: PERIPHERAL VASCULAR BALLOON ANGIOPLASTY;  Surgeon: Serafina Mitchell, MD;  Location: [place] [facility];  Service: Cardiovascular;  Laterality: Left;  AVF   PERIPHERAL VASCULAR BALLOON ANGIOPLASTY Left 03/17/2020   Procedure: PERIPHERAL VASCULAR BALLOON ANGIOPLASTY;  Surgeon: Marty Heck, MD;  Location:  [place] [facility];  Service: Cardiovascular;  Laterality: Left;  AVF   PERIPHERAL VASCULAR BALLOON ANGIOPLASTY Left 05/05/2020   Procedure: PERIPHERAL VASCULAR BALLOON ANGIOPLASTY;  Surgeon: Marty Heck, MD;  Location: [place] [facility];  Service: Cardiovascular;  Laterality: Left;  arm fistula   RADIOLOGY WITH ANESTHESIA N/A 09/15/2019   Procedure: [facility] AND LUMBER LOWER BACK PAIN;  Surgeon: Radiologist, Medication, MD;  Location: [place];  Service: Radiology;  Laterality: N/A;   TUBAL LIGATION     Past Surgical History:  Procedure Laterality Date   A/V FISTULAGRAM Left 01/12/2020   Procedure: A/V FISTULAGRAM;  Surgeon: Serafina Mitchell, MD;  Location: [place] [facility];  Service: Cardiovascular;  Laterality: Left;   A/V FISTULAGRAM N/A 03/17/2020   Procedure: A/V FISTULAGRAM - Left Arm;  Surgeon: Carlis Abbott,  Gwenyth Allegra, MD;  Location: [place] [facility];  Service: Cardiovascular;  Laterality: N/A;   A/V FISTULAGRAM Left 06/23/2020   Procedure: A/V FISTULAGRAM;  Surgeon: Marty Heck, MD;  Location: [place] [facility];  Service: Cardiovascular;  Laterality: Left;   ANKLE FUSION Left 06/01/2020   Procedure: LEFT ANKLE FUSION;  Surgeon: Newt Minion, MD;  Location: [place];  Service: Orthopedics;  Laterality: Left;   AV FISTULA PLACEMENT Left 08/17/2019   Procedure: LEFT BRACHIAL CEPHALIC ARTERIOVENOUS (AV) FISTULA;  Surgeon: Angelia Mould, MD;  Location: [place];  Service: Vascular;  Laterality: Left;   BIOPSY  02/28/2021   Procedure: BIOPSY;  Surgeon: Harvel Quale, MD;  [facility];  Service: Gastroenterology;;  duodenum gastric esophagus colon   COLONOSCOPY WITH PROPOFOL N/A 02/28/2021   Procedure: COLONOSCOPY WITH PROPOFOL;  Surgeon: Harvel Quale, MD;  [facility];  Service: Gastroenterology;  Laterality: N/A;  11:00   CORONARY STENT INTERVENTION N/A 10/18/2019   Procedure: CORONARY STENT INTERVENTION;  Surgeon: Sherren Mocha, MD;  Location: [place] [facility];  Service: Cardiovascular;  Laterality: N/A;   CORONARY/GRAFT ACUTE MI REVASCULARIZATION N/A 10/18/2019   Procedure: Coronary/Graft Acute MI Revascularization;  Surgeon: Sherren Mocha, MD;  Location: [place] [facility];  Service: Cardiovascular;  Laterality: N/A;   ESOPHAGOGASTRODUODENOSCOPY (EGD) WITH PROPOFOL N/A 02/28/2021   Procedure: ESOPHAGOGASTRODUODENOSCOPY (EGD) WITH PROPOFOL;  Surgeon: Harvel Quale, MD;  [facility];  Service: Gastroenterology;  Laterality: N/A;   EYE SURGERY Bilateral    cataracts removed   FEMUR IM NAIL Left 10/28/2018   Procedure: RETROGRADE FEMORAL NAILING;  Surgeon: Meredith Pel, MD;  Location: [place];  Service: Orthopedics;  Laterality: Left;   IM NAILING FEMORAL SHAFT RETROGRADE Left 10/28/2018   INTRAVASCULAR ULTRASOUND/IVUS N/A 10/18/2019   Procedure: Intravascular Ultrasound/IVUS;  Surgeon: Sherren Mocha, MD;  Location: [place] [facility];  Service: Cardiovascular;  Laterality: N/A;   IR FLUORO GUIDE CV LINE RIGHT  08/11/2019   IR THORACENTESIS ASP PLEURAL SPACE W/IMG GUIDE  11/12/2019   IR US GUIDE VASC ACCESS RIGHT  08/11/2019   KNEE SURGERY Left    LEFT HEART CATH AND CORONARY ANGIOGRAPHY N/A 10/18/2019   Procedure: LEFT HEART CATH AND CORONARY ANGIOGRAPHY;  Surgeon: Sherren Mocha, MD;  Location: [place] [facility];  Service: Cardiovascular;  Laterality: N/A;   PERIPHERAL VASCULAR BALLOON ANGIOPLASTY Left 01/12/2020   Procedure: PERIPHERAL VASCULAR BALLOON ANGIOPLASTY;  Surgeon: Serafina Mitchell, MD;  Location: [place] [facility];  Service: Cardiovascular;  Laterality: Left;  AVF   PERIPHERAL VASCULAR BALLOON ANGIOPLASTY Left 03/17/2020   Procedure: PERIPHERAL VASCULAR BALLOON ANGIOPLASTY;  Surgeon: Marty Heck, MD;  Location: [place] [facility];  Service: Cardiovascular;  Laterality: Left;  AVF   PERIPHERAL VASCULAR BALLOON ANGIOPLASTY Left 05/05/2020   Procedure: PERIPHERAL  VASCULAR BALLOON ANGIOPLASTY;  Surgeon: Marty Heck, MD;  Location: [place] [facility];  Service: Cardiovascular;  Laterality: Left;  arm fistula   RADIOLOGY WITH ANESTHESIA N/A 09/15/2019   Procedure: [facility] AND LUMBER LOWER BACK PAIN;  Surgeon: Radiologist, Medication, MD;  Location: [place];  Service: Radiology;  Laterality: N/A;   TUBAL LIGATION     Past Medical History:  Diagnosis Date   Acute cystitis without hematuria    Acute midline thoracic back pain 41/93/7902   Acute systolic heart failure ([place]) 10/21/2019   Anemia    Arthritis    CAD (coronary artery disease)  S/p late presenting Inf STEMI 10/2019 >> PCI: DES x 2 to RCA // Myoview 8/22: EF 54, no ischemia or infarction; low risk   Cataracts, bilateral    surgery to remove   CATARACTS, BILATERAL 07/02/2007   Qualifier: Diagnosis of  By: Isla Pence     Closed fracture of left femur ([place]) 10/28/2018   Closed fracture of right ankle 11/06/2017   Diabetes mellitus    type 2   Emphysematous cystitis 08/26/2018   Encounter for gynecological examination with Papanicolaou smear of cervix 01/14/2020   Encounter for screening fecal occult blood testing 01/14/2020   Encounter for screening for malignant neoplasm of cervix 12/29/2019   Encounter for screening mammogram for malignant neoplasm of breast 12/29/2019   ESRD on hemodialysis ([place])    MWF - in [place]   GERD (gastroesophageal reflux disease)    Hyperlipidemia    Hypertension    Hypokalemia 08/26/2018   IRREGULAR MENSES 09/14/2009   Qualifier: Diagnosis of  By: Hassell Done FNP, [place]     Ischemic cardiomyopathy    Echocardiogram 10/19/2019: EF 40-45, inf WMA, Gr 1 DD, Lg L pl Eff // EF 54 by Myoview in 8/22   Loose stools 11/12/2019   Normocytic anemia 08/26/2018   PARONYCHIA, RIGHT GREAT TOE 07/30/2008   Qualifier: Diagnosis of  By: Hassell Done FNP, [place]ked[place]     Pressure ulcer 09/10/2019   Right arm weakness 08/08/2019   Sprain of left ankle    STEMI (ST  elevation myocardial infarction) ([place]) 10/18/2019   STEMI involving right coronary artery ([place]) 10/18/2019   Stroke ([place]) 10/2019   SVD (spontaneous vaginal delivery)    x 4   Vaginosis 08/26/2018   Weakness 09/10/2019   Weakness of both lower extremities    BP (!) 147/67   Pulse 74   Ht 5\' 3"  (1.6 m)   Wt 168 lb 6.4 oz (76.4 kg)   LMP 12/17/2016   SpO2 97%   BMI 29.83 kg/m   Opioid Risk Score:   Fall Risk Score:  `1  Depression screen PHQ 2/9  Depression screen [facility] 2/9 02/14/2021 11/29/2020 11/10/2020 06/16/2020 05/26/2020 02/18/2020 01/14/2020  Decreased Interest 0 0 0 0 0 0 0  Down, Depressed, Hopeless 0 1 0 0 0 0 0  PHQ - 2 Score 0 1 0 0 0 0 0  Altered sleeping - 3 - - - - 0  Tired, decreased energy - 3 - - - - 0  Change in appetite - 3 - - - - 0  Feeling bad or failure about yourself  - 0 - - - - 0  Trouble concentrating - 0 - - - - 0  Moving slowly or fidgety/restless - 2 - - - - 0  Suicidal thoughts - 0 - - - - 0  PHQ-9 Score - 12 - - - - 0  Difficult doing work/chores - Very difficult - - - Not difficult at all -  Some recent data might be hidden     Review of Systems     Objective:   Physical Exam Vitals reviewed.  Constitutional:      Appearance: She is obese.  HENT:     Head: Normocephalic and atraumatic.  Eyes:     Extraocular Movements: Extraocular movements intact.     Conjunctiva/sclera: Conjunctivae normal.     Pupils: Pupils are equal, round, and reactive to light.  Musculoskeletal:     Comments: Left double metal upright AFO Right ankle -10 degree plantar flexion contracture.  Standing. No pain with ankle range of motion. There is no evidence of spastic sensation in the right foot Skin without lesions.  No joint effusion.  Skin:    General: Skin is warm and dry.  Neurological:     Mental Status: She is alert and oriented to person, place, and time.  Psychiatric:        Mood and Affect: Mood normal.        Behavior: Behavior normal.   Reduced  sensation to light touch in right foot, she is able to identify which toes touched however    Assessment & Plan:   1.  Chronic diabetic neuropathy pain the patient with end-stage renal disease.  She is on a higher than gabapentin dose given her end-stage renal disease but seems to be tolerating this well without side effects and has been on this for quite a while. Continue tramadol 50 mg nightly Follow-up in 6 months Have written for PR AFO

## 2021-05-16 ENCOUNTER — Encounter: Payer: Self-pay | Admitting: Orthopedic Surgery

## 2021-05-16 ENCOUNTER — Ambulatory Visit (INDEPENDENT_AMBULATORY_CARE_PROVIDER_SITE_OTHER): Payer: Medicaid Other | Admitting: Orthopedic Surgery

## 2021-05-16 ENCOUNTER — Ambulatory Visit: Payer: Self-pay

## 2021-05-16 DIAGNOSIS — Z992 Dependence on renal dialysis: Secondary | ICD-10-CM

## 2021-05-16 DIAGNOSIS — E1121 Type 2 diabetes mellitus with diabetic nephropathy: Secondary | ICD-10-CM

## 2021-05-16 DIAGNOSIS — S82852A Displaced trimalleolar fracture of left lower leg, initial encounter for closed fracture: Secondary | ICD-10-CM

## 2021-05-16 DIAGNOSIS — N186 End stage renal disease: Secondary | ICD-10-CM | POA: Diagnosis not present

## 2021-05-16 DIAGNOSIS — M76821 Posterior tibial tendinitis, right leg: Secondary | ICD-10-CM | POA: Diagnosis not present

## 2021-05-16 NOTE — Progress Notes (Signed)
Office Visit Note   Patient: Melanie Hall           Date of Birth: 1971-03-19           MRN: [id number] Visit Date: 05/16/2021              Requested by: Noreene Larsson, NP [address]  [address] [place],  [place] [address] PCP: Noreene Larsson, NP  Chief Complaint  Patient presents with   Left Ankle - Follow-up    06/01/20 left ankle fusion       HPI: Patient is a 50 year old woman with diabetes end-stage renal disease on dialysis status post internal fixation for a left ankle pilon fracture.  Patient subsequently developed Charcot arthropathy and 1 year out patient's ankle is stable and she is ambulating with a double upright brace.  Patient is currently at a pain clinic and she complains of increasing instability in the right ankle with posterior tibial tendon insufficiency.  Patient ambulates with a rolling walker.  Assessment & Plan: Visit Diagnoses:  1. Trimalleolar fracture of ankle, closed, left, initial encounter   2. Type 2 diabetes with nephropathy ([place])   3. ESRD (end stage renal disease) on dialysis ([place])   4. Posterior tibial tendinitis, right leg     Plan: Patient is given a prescription for [employer] on [employer] for a double upright brace on the right to stabilize the posterior tibial tendon insufficiency on the right and provide for safe ambulation.  Follow-Up Instructions: No follow-ups on file.   Ortho Exam  Patient is alert, oriented, no adenopathy, well-dressed, normal affect, normal respiratory effort. Examination patient's left foot is plantigrade the right foot will dorsiflex to neutral.  She has weakness with dorsiflexion and instability of the subtalar joint secondary to posterior tibial tendon insufficiency.  There is no cellulitis or signs of infection in either ankle.  Imaging: XR Ankle Complete Left  Result Date: 05/16/2021 Three-view radiographs of the left ankle shows internal fixation from a ankle fusion with hypertrophic  bone formation from Charcot arthropathy.  There is still good subtalar joint space and calcification of the arteries around the ankle.  No images are attached to the encounter.  Labs: Lab Results  Component Value Date   HGBA1C 8.7 (H) 02/14/2021   HGBA1C 8.7 (H) 06/02/2020   HGBA1C 7.6 (H) 10/18/2019   ESRSEDRATE 45 (H) 09/10/2019   CRP 5.0 (H) 09/10/2019   LABURIC 5.7 05/27/2019   REPTSTATUS 11/13/2019 FINAL 11/12/2019   REPTSTATUS 11/17/2019 FINAL 11/12/2019   GRAMSTAIN  11/12/2019    WBC PRESENT,BOTH PMN AND MONONUCLEAR NO ORGANISMS SEEN CYTOSPIN SMEAR Performed at [facility], Ali [address] [place], [place] [address]    CULT  11/12/2019    NO GROWTH 5 DAYS Performed at [facility] [address] [place], [place] [address]    LABORGA ESCHERICHIA COLI (A) 08/26/2018     Lab Results  Component Value Date   ALBUMIN 3.8 02/16/2021   ALBUMIN 4.5 02/14/2021   ALBUMIN 3.8 11/10/2020    Lab Results  Component Value Date   MG 1.8 08/24/2019   MG 1.7 08/23/2019   MG 1.7 08/09/2019   Lab Results  Component Value Date   VD25OH 8.82 (L) 05/27/2019    No results found for: PREALBUMIN CBC EXTENDED Latest Ref Rng & Units 02/28/2021 02/14/2021 11/10/2020  WBC 3.4 - 10.8 x10E3/uL - 7.4 8.6  RBC 3.77 - 5.28 x10E6/uL - 3.34(L) 3.26(L)  HGB 12.0 - 15.0 g/dL 10.9(L) 11.5 10.4(L)  HCT 36.0 - 46.0 % 32.0(L) 35.4 32.6(L)  PLT 150 - 450 x10E3/uL - 195 212  NEUTROABS 1.4 - 7.0 x10E3/uL - 4.8 5.1  LYMPHSABS 0.7 - 3.1 x10E3/uL - 1.5 1.9     There is no height or weight on file to calculate BMI.  Orders:  Orders Placed This Encounter  Procedures   XR Ankle Complete Left   No orders of the defined types were placed in this encounter.    Procedures: No procedures performed  Clinical Data: No additional findings.  ROS:  All other systems negative, except as noted in the HPI. Review of Systems  Objective: Vital Signs: LMP 12/17/2016   Specialty  Comments:  No specialty comments available.  PMFS History: Patient Active Problem List   Diagnosis Date Noted   CAD (coronary artery disease) 02/17/2021   Ischemic cardiomyopathy 02/17/2021   Pain and tenderness 11/10/2020   Encounter for support and coordination of transition of care 06/16/2020   S/P ankle fusion 06/01/2020   Pilon fracture of left tibia, closed, initial encounter    Displaced trimalleolar fracture of left lower leg, initial encounter for closed fracture    Abnormality of gait and mobility 05/26/2020   Migraine 05/26/2020   Diabetic retinopathy ([place]) 05/04/2020   Chronic diarrhea of unknown origin 01/14/2020   Type 2 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, with long-term current use of insulin ([place]) 12/29/2019   Hypothyroidism 12/29/2019   Weakness of both lower extremities 11/12/2019   Debility 09/10/2019   ESRD (end stage renal disease) on dialysis ([place]) 09/10/2019   Depression 09/10/2019   Incontinence in female 09/10/2019   Anemia in chronic kidney disease 08/19/2019   MGUS (monoclonal gammopathy of unknown significance) 07/23/2019   GERD (gastroesophageal reflux disease) 02/28/2019   Essential hypertension 10/28/2018   Irregular menses 09/14/2009   Hypercholesterolemia 11/19/2008   Type 2 diabetes with nephropathy ([place]) 07/02/2007   Past Medical History:  Diagnosis Date   Acute cystitis without hematuria    Acute midline thoracic back pain 35/36/1443   Acute systolic heart failure ([place]) 10/21/2019   Anemia    Arthritis    CAD (coronary artery disease)    S/p late presenting Inf STEMI 10/2019 >> PCI: DES x 2 to RCA // Myoview 8/22: EF 54, no ischemia or infarction; low risk   Cataracts, bilateral    surgery to remove   CATARACTS, BILATERAL 07/02/2007   Qualifier: Diagnosis of  By: Isla Pence     Closed fracture of left femur ([place]) 10/28/2018   Closed fracture of right ankle 11/06/2017   Diabetes mellitus    type 2    Emphysematous cystitis 08/26/2018   Encounter for gynecological examination with Papanicolaou smear of cervix 01/14/2020   Encounter for screening fecal occult blood testing 01/14/2020   Encounter for screening for malignant neoplasm of cervix 12/29/2019   Encounter for screening mammogram for malignant neoplasm of breast 12/29/2019   ESRD on hemodialysis ([place])    MWF - in [place]   GERD (gastroesophageal reflux disease)    Hyperlipidemia    Hypertension    Hypokalemia 08/26/2018   IRREGULAR MENSES 09/14/2009   Qualifier: Diagnosis of  By: Hassell Done FNP, [place]     Ischemic cardiomyopathy    Echocardiogram 10/19/2019: EF 40-45, inf WMA, Gr 1 DD, Lg L pl Eff // EF 54 by Myoview in 8/22   Loose stools 11/12/2019   Normocytic anemia 08/26/2018  PARONYCHIA, RIGHT GREAT TOE 07/30/2008   Qualifier: Diagnosis of  By: Hassell Done FNP, Nykedtra     Pressure ulcer 09/10/2019   Right arm weakness 08/08/2019   Sprain of left ankle    STEMI (ST elevation myocardial infarction) ([place]) 10/18/2019   STEMI involving right coronary artery ([place]) 10/18/2019   Stroke ([place]) 10/2019   SVD (spontaneous vaginal delivery)    x 4   Vaginosis 08/26/2018   Weakness 09/10/2019   Weakness of both lower extremities     Family History  Problem Relation Age of Onset   Diabetes Mother    Hypertension Mother    Bipolar disorder Mother    Brain cancer Maternal Aunt    Heart disease Maternal Grandmother    Diabetes Maternal Grandmother    Breast cancer Maternal Aunt    Depression Father        Committed suicide   Diabetes Sister     Past Surgical History:  Procedure Laterality Date   A/V FISTULAGRAM Left 01/12/2020   Procedure: A/V FISTULAGRAM;  Surgeon: Serafina Mitchell, MD;  Location: [facility];  Service: Cardiovascular;  Laterality: Left;   A/V FISTULAGRAM N/A 03/17/2020   Procedure: A/V FISTULAGRAM - Left Arm;  Surgeon: Marty Heck, MD;  Location: [facility];  Service:  Cardiovascular;  Laterality: N/A;   A/V FISTULAGRAM Left 06/23/2020   Procedure: A/V FISTULAGRAM;  Surgeon: Marty Heck, MD;  Location: [facility];  Service: Cardiovascular;  Laterality: Left;   ANKLE FUSION Left 06/01/2020   Procedure: LEFT ANKLE FUSION;  Surgeon: Newt Minion, MD;  Location: [facility];  Service: Orthopedics;  Laterality: Left;   AV FISTULA PLACEMENT Left 08/17/2019   Procedure: LEFT BRACHIAL CEPHALIC ARTERIOVENOUS (AV) FISTULA;  Surgeon: Angelia Mould, MD;  Location: [facility];  Service: Vascular;  Laterality: Left;   BIOPSY  02/28/2021   Procedure: BIOPSY;  Surgeon: Harvel Quale, MD;  [facility];  Service: Gastroenterology;;  duodenum gastric esophagus colon   COLONOSCOPY WITH PROPOFOL N/A 02/28/2021   Procedure: COLONOSCOPY WITH PROPOFOL;  Surgeon: Harvel Quale, MD;  [facility];  Service: Gastroenterology;  Laterality: N/A;  11:00   CORONARY STENT INTERVENTION N/A 10/18/2019   Procedure: CORONARY STENT INTERVENTION;  Surgeon: Sherren Mocha, MD;  Location: [facility];  Service: Cardiovascular;  Laterality: N/A;   CORONARY/GRAFT ACUTE MI REVASCULARIZATION N/A 10/18/2019   Procedure: Coronary/Graft Acute MI Revascularization;  Surgeon: Sherren Mocha, MD;  Location: [facility];  Service: Cardiovascular;  Laterality: N/A;   ESOPHAGOGASTRODUODENOSCOPY (EGD) WITH PROPOFOL N/A 02/28/2021   Procedure: ESOPHAGOGASTRODUODENOSCOPY (EGD) WITH PROPOFOL;  Surgeon: Harvel Quale, MD;  [facility];  Service: Gastroenterology;  Laterality: N/A;   EYE SURGERY Bilateral    cataracts removed   FEMUR IM NAIL Left 10/28/2018   Procedure: RETROGRADE FEMORAL NAILING;  Surgeon: Meredith Pel, MD;  Location: [facility];  Service: Orthopedics;  Laterality: Left;   IM NAILING FEMORAL SHAFT RETROGRADE Left 10/28/2018   INTRAVASCULAR ULTRASOUND/IVUS N/A 10/18/2019   Procedure: Intravascular  Ultrasound/IVUS;  Surgeon: Sherren Mocha, MD;  Location: [facility];  Service: Cardiovascular;  Laterality: N/A;   IR FLUORO GUIDE CV LINE RIGHT  08/11/2019   IR THORACENTESIS ASP PLEURAL SPACE W/IMG GUIDE  11/12/2019   IR US GUIDE VASC ACCESS RIGHT  08/11/2019   KNEE SURGERY Left    LEFT HEART CATH AND CORONARY ANGIOGRAPHY N/A 10/18/2019   Procedure: LEFT  HEART CATH AND CORONARY ANGIOGRAPHY;  Surgeon: Sherren Mocha, MD;  Location: [facility];  Service: Cardiovascular;  Laterality: N/A;   PERIPHERAL VASCULAR BALLOON ANGIOPLASTY Left 01/12/2020   Procedure: PERIPHERAL VASCULAR BALLOON ANGIOPLASTY;  Surgeon: Serafina Mitchell, MD;  Location: [facility];  Service: Cardiovascular;  Laterality: Left;  AVF   PERIPHERAL VASCULAR BALLOON ANGIOPLASTY Left 03/17/2020   Procedure: PERIPHERAL VASCULAR BALLOON ANGIOPLASTY;  Surgeon: Marty Heck, MD;  Location: [facility];  Service: Cardiovascular;  Laterality: Left;  AVF   PERIPHERAL VASCULAR BALLOON ANGIOPLASTY Left 05/05/2020   Procedure: PERIPHERAL VASCULAR BALLOON ANGIOPLASTY;  Surgeon: Marty Heck, MD;  Location: [facility];  Service: Cardiovascular;  Laterality: Left;  arm fistula   RADIOLOGY WITH ANESTHESIA N/A 09/15/2019   Procedure: [facility] AND LUMBER LOWER BACK PAIN;  Surgeon: Radiologist, Medication, MD;  Location: [facility];  Service: Radiology;  Laterality: N/A;   TUBAL LIGATION     Social History   Occupational History   Not on file  Tobacco Use   Smoking status: Former    Packs/day: 0.25    Years: 2.00    Pack years: 0.50    Types: Cigarettes    Quit date: 1997    Years since quitting: 25.8   Smokeless tobacco: Never  Vaping Use   Vaping Use: Never used  Substance and Sexual Activity   Alcohol use: No   Drug use: No   Sexual activity: Yes    Birth control/protection: Surgical    Comment: tubal

## 2021-05-30 ENCOUNTER — Ambulatory Visit: Payer: Medicaid Other | Admitting: Physician Assistant

## 2021-06-16 ENCOUNTER — Telehealth: Payer: Self-pay

## 2021-06-16 NOTE — Telephone Encounter (Signed)
Patient canceled appt today and I reminded her she needed to find a new PCP.  She still canceled the appointment and said she will call back when we get a new MD in 2023.

## 2021-06-20 ENCOUNTER — Ambulatory Visit: Payer: Medicaid Other | Admitting: Nurse Practitioner

## 2021-07-03 ENCOUNTER — Other Ambulatory Visit: Payer: Self-pay | Admitting: Physician Assistant

## 2021-07-04 ENCOUNTER — Other Ambulatory Visit: Payer: Self-pay | Admitting: *Deleted

## 2021-07-04 MED ORDER — CARVEDILOL 12.5 MG PO TABS: 12.5000 mg | ORAL_TABLET | Freq: Two times a day (BID) | ORAL | 2 refills | Status: AC

## 2021-07-06 ENCOUNTER — Telehealth: Payer: Self-pay

## 2021-07-06 NOTE — Telephone Encounter (Signed)
Patient returned call from Danella Sensing, NP

## 2021-07-06 NOTE — Telephone Encounter (Signed)
PMP was Reviewed. Dr Letta Pate note was reviewed. Placed a call to Ms. Versteeg regarding her Tramadol, no answer. Left message to return the call.

## 2021-07-06 NOTE — Telephone Encounter (Signed)
Patient called requesting a refill on Tramadol. She is prescribed 1 tablet daily as needed, but has been taking it 2-3 times a day. Per PMP, it was last filled 06/12/21.

## 2021-07-07 ENCOUNTER — Telehealth: Payer: Self-pay | Admitting: *Deleted

## 2021-07-07 MED ORDER — TRAMADOL HCL 50 MG PO TABS: 50.0000 mg | ORAL_TABLET | Freq: Two times a day (BID) | ORAL | 5 refills | Status: DC | PRN

## 2021-07-07 NOTE — Telephone Encounter (Signed)
Dr Melanie Hall note was reviewed. UP to Date was Reviewed.  PMP was Reviewed, last Tramadol Prescription was filled on 06/12/2021 Spoke with Pharmacist at [facility],  Melanie Hall states for the last two weeks she has been taking her Tramadol 2- 3 times a week,due to increase intensity of pain in her lower extremities, she did not call office prior to self medicating. Narcotic Contract was reviewed, she realizes if this occurs again the above can lead o being discharge from our office, she verbalizes understanding. Will prescribe Tramadol for twice a day for a week, Dr Melanie Hall will return next week, she verbalizes understanding.

## 2021-07-07 NOTE — Telephone Encounter (Signed)
Melanie Hall reports that her legs were hurting her a lot more and she took the tramadol 2-3x a day to get through the holiday. I have given the information to [name] and she will call back. I have asked that she stay near her phone and answer when called.

## 2021-07-07 NOTE — Telephone Encounter (Signed)
Returned Ms. Califf call, no answer. Left message to return the call.

## 2021-07-11 MED ORDER — TRAMADOL HCL 50 MG PO TABS: 50.0000 mg | ORAL_TABLET | Freq: Two times a day (BID) | ORAL | 4 refills | Status: AC | PRN

## 2021-07-11 NOTE — Telephone Encounter (Signed)
Dr Letta Pate responded to provider question regarding the Tramadol. Placed a call to Ms. Woolford, no answer. Left message to return the call.  Placed a call to her pharmacy,

## 2021-07-11 NOTE — Telephone Encounter (Signed)
Tramadol prescription was e-scribed today.

## 2021-07-11 NOTE — Addendum Note (Signed)
Addended by: Bayard Hugger on: 07/11/2021 04:02 PM   Modules accepted: Orders

## 2021-07-14 ENCOUNTER — Other Ambulatory Visit: Payer: Self-pay | Admitting: Nurse Practitioner

## 2021-07-14 DIAGNOSIS — E039 Hypothyroidism, unspecified: Secondary | ICD-10-CM

## 2021-07-17 ENCOUNTER — Ambulatory Visit (INDEPENDENT_AMBULATORY_CARE_PROVIDER_SITE_OTHER): Payer: Medicaid Other | Admitting: Orthopedic Surgery

## 2021-07-17 ENCOUNTER — Encounter: Payer: Self-pay | Admitting: Orthopedic Surgery

## 2021-07-17 DIAGNOSIS — N186 End stage renal disease: Secondary | ICD-10-CM | POA: Diagnosis not present

## 2021-07-17 DIAGNOSIS — R29898 Other symptoms and signs involving the musculoskeletal system: Secondary | ICD-10-CM | POA: Diagnosis not present

## 2021-07-17 DIAGNOSIS — E1121 Type 2 diabetes mellitus with diabetic nephropathy: Secondary | ICD-10-CM | POA: Diagnosis not present

## 2021-07-17 DIAGNOSIS — Z992 Dependence on renal dialysis: Secondary | ICD-10-CM | POA: Diagnosis not present

## 2021-07-17 NOTE — Progress Notes (Signed)
Office Visit Note   Patient: Melanie Hall           Date of Birth: Oct 03, 1970           MRN: [id number] Visit Date: 07/17/2021              Requested by: Noreene Larsson, NP No address on file PCP: No primary care provider on file.  Chief Complaint  Patient presents with   Left Ankle - Follow-up    06/01/20 left ankle fusion       HPI: Patient is a 51 year old woman who presents complaining of weakness in her upper and lower extremities.  Patient states she has difficulty getting up a few steps to get into her home.  She states she was unable to get a ramp built.  She complains of weakness in her hands and thighs.  She is wearing double upright braces laterally.  Assessment & Plan: Visit Diagnoses: No diagnosis found.  Plan: Orders placed for physical therapy at [facility].  Follow-Up Instructions: Return in about 2 months (around 09/14/2021).   Ortho Exam  Patient is alert, oriented, no adenopathy, well-dressed, normal affect, normal respiratory effort. Examination patient has generalized weakness with hip flexion knee flexion and extension bilaterally.  Her left foot is plantigrade there are no ulcers no calluses.  Her foot is stable with a double upright braces on the left.  She does have intrinsic weakness in both hands.  Imaging: No results found. No images are attached to the encounter.  Labs: Lab Results  Component Value Date   HGBA1C 8.7 (H) 02/14/2021   HGBA1C 8.7 (H) 06/02/2020   HGBA1C 7.6 (H) 10/18/2019   ESRSEDRATE 45 (H) 09/10/2019   CRP 5.0 (H) 09/10/2019   LABURIC 5.7 05/27/2019   REPTSTATUS 11/13/2019 FINAL 11/12/2019   REPTSTATUS 11/17/2019 FINAL 11/12/2019   GRAMSTAIN  11/12/2019    WBC PRESENT,BOTH PMN AND MONONUCLEAR NO ORGANISMS SEEN CYTOSPIN SMEAR Performed at [facility], [place] [address] [place], [place] [address]    CULT  11/12/2019    NO GROWTH 5 DAYS Performed at [place] [address] [place], [place] [address]     LABORGA ESCHERICHIA COLI (A) 08/26/2018     Lab Results  Component Value Date   ALBUMIN 3.8 02/16/2021   ALBUMIN 4.5 02/14/2021   ALBUMIN 3.8 11/10/2020    Lab Results  Component Value Date   MG 1.8 08/24/2019   MG 1.7 08/23/2019   MG 1.7 08/09/2019   Lab Results  Component Value Date   VD25OH 8.82 (L) 05/27/2019    No results found for: PREALBUMIN CBC EXTENDED Latest Ref Rng & Units 02/28/2021 02/14/2021 11/10/2020  WBC 3.4 - 10.8 x10E3/uL - 7.4 8.6  RBC 3.77 - 5.28 x10E6/uL - 3.34(L) 3.26(L)  HGB 12.0 - 15.0 g/dL 10.9(L) 11.5 10.4(L)  HCT 36.0 - 46.0 % 32.0(L) 35.4 32.6(L)  PLT 150 - 450 x10E3/uL - 195 212  NEUTROABS 1.4 - 7.0 x10E3/uL - 4.8 5.1  LYMPHSABS 0.7 - 3.1 x10E3/uL - 1.5 1.9     There is no height or weight on file to calculate BMI.  Orders:  No orders of the defined types were placed in this encounter.  No orders of the defined types were placed in this encounter.    Procedures: No procedures performed  Clinical Data: No additional findings.  ROS:  All other systems negative, except as noted in the HPI. Review of Systems  Objective: Vital Signs:  LMP 12/17/2016   Specialty Comments:  No specialty comments available.  PMFS History: Patient Active Problem List   Diagnosis Date Noted   CAD (coronary artery disease) 02/17/2021   Ischemic cardiomyopathy 02/17/2021   Pain and tenderness 11/10/2020   Encounter for support and coordination of transition of care 06/16/2020   S/P ankle fusion 06/01/2020   Pilon fracture of left tibia, closed, initial encounter    Displaced trimalleolar fracture of left lower leg, initial encounter for closed fracture    Abnormality of gait and mobility 05/26/2020   Migraine 05/26/2020   Diabetic retinopathy ([place]) 05/04/2020   Chronic diarrhea of unknown origin 01/14/2020   Type 2 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, with long-term current use of insulin ([place]) 12/29/2019    Hypothyroidism 12/29/2019   Weakness of both lower extremities 11/12/2019   Debility 09/10/2019   ESRD (end stage renal disease) on dialysis ([place]) 09/10/2019   Depression 09/10/2019   Incontinence in female 09/10/2019   Anemia in chronic kidney disease 08/19/2019   MGUS (monoclonal gammopathy of unknown significance) 07/23/2019   GERD (gastroesophageal reflux disease) 02/28/2019   Essential hypertension 10/28/2018   Irregular menses 09/14/2009   Hypercholesterolemia 11/19/2008   Type 2 diabetes with nephropathy ([place]) 07/02/2007   Past Medical History:  Diagnosis Date   Acute cystitis without hematuria    Acute midline thoracic back pain 40/97/3532   Acute systolic heart failure ([place]) 10/21/2019   Anemia    Arthritis    CAD (coronary artery disease)    S/p late presenting Inf STEMI 10/2019 >> PCI: DES x 2 to RCA // Myoview 8/22: EF 54, no ischemia or infarction; low risk   Cataracts, bilateral    surgery to remove   CATARACTS, BILATERAL 07/02/2007   Qualifier: Diagnosis of  By: Isla Pence     Closed fracture of left femur ([place]) 10/28/2018   Closed fracture of right ankle 11/06/2017   Diabetes mellitus    type 2   Emphysematous cystitis 08/26/2018   Encounter for gynecological examination with Papanicolaou smear of cervix 01/14/2020   Encounter for screening fecal occult blood testing 01/14/2020   Encounter for screening for malignant neoplasm of cervix 12/29/2019   Encounter for screening mammogram for malignant neoplasm of breast 12/29/2019   ESRD on hemodialysis ([place])    MWF - in [place]   GERD (gastroesophageal reflux disease)    Hyperlipidemia    Hypertension    Hypokalemia 08/26/2018   IRREGULAR MENSES 09/14/2009   Qualifier: Diagnosis of  By: Hassell Done FNP, [place]     Ischemic cardiomyopathy    Echocardiogram 10/19/2019: EF 40-45, inf WMA, Gr 1 DD, Lg L pl Eff // EF 54 by Myoview in 8/22   Loose stools 11/12/2019   Normocytic anemia 08/26/2018   PARONYCHIA,  RIGHT GREAT TOE 07/30/2008   Qualifier: Diagnosis of  By: Hassell Done FNP, [place]     Pressure ulcer 09/10/2019   Right arm weakness 08/08/2019   Sprain of left ankle    STEMI (ST elevation myocardial infarction) ([place]) 10/18/2019   STEMI involving right coronary artery ([place]) 10/18/2019   Stroke ([place]) 10/2019   SVD (spontaneous vaginal delivery)    x 4   Vaginosis 08/26/2018   Weakness 09/10/2019   Weakness of both lower extremities     Family History  Problem Relation Age of Onset   Diabetes Mother    Hypertension Mother    Bipolar disorder Mother    Brain cancer Maternal Aunt  Heart disease Maternal Grandmother    Diabetes Maternal Grandmother    Breast cancer Maternal Aunt    Depression Father        Committed suicide   Diabetes Sister     Past Surgical History:  Procedure Laterality Date   A/V FISTULAGRAM Left 01/12/2020   Procedure: A/V FISTULAGRAM;  Surgeon: Serafina Mitchell, MD;  Location: [facility];  Service: Cardiovascular;  Laterality: Left;   A/V FISTULAGRAM N/A 03/17/2020   Procedure: A/V FISTULAGRAM - Left Arm;  Surgeon: Marty Heck, MD;  Location: [facility];  Service: Cardiovascular;  Laterality: N/A;   A/V FISTULAGRAM Left 06/23/2020   Procedure: A/V FISTULAGRAM;  Surgeon: Marty Heck, MD;  Location: [facility];  Service: Cardiovascular;  Laterality: Left;   ANKLE FUSION Left 06/01/2020   Procedure: LEFT ANKLE FUSION;  Surgeon: Newt Minion, MD;  Location: [facility];  Service: Orthopedics;  Laterality: Left;   AV FISTULA PLACEMENT Left 08/17/2019   Procedure: LEFT BRACHIAL CEPHALIC ARTERIOVENOUS (AV) FISTULA;  Surgeon: Angelia Mould, MD;  Location: [facility];  Service: Vascular;  Laterality: Left;   BIOPSY  02/28/2021   Procedure: BIOPSY;  Surgeon: Harvel Quale, MD;  [facility];  Service: Gastroenterology;;  duodenum gastric esophagus colon   COLONOSCOPY WITH PROPOFOL N/A 02/28/2021   Procedure:  COLONOSCOPY WITH PROPOFOL;  Surgeon: Harvel Quale, MD;  [facility];  Service: Gastroenterology;  Laterality: N/A;  11:00   CORONARY STENT INTERVENTION N/A 10/18/2019   Procedure: CORONARY STENT INTERVENTION;  Surgeon: Sherren Mocha, MD;  Location: [facility];  Service: Cardiovascular;  Laterality: N/A;   CORONARY/GRAFT ACUTE MI REVASCULARIZATION N/A 10/18/2019   Procedure: Coronary/Graft Acute MI Revascularization;  Surgeon: Sherren Mocha, MD;  Location: [facility];  Service: Cardiovascular;  Laterality: N/A;   ESOPHAGOGASTRODUODENOSCOPY (EGD) WITH PROPOFOL N/A 02/28/2021   Procedure: ESOPHAGOGASTRODUODENOSCOPY (EGD) WITH PROPOFOL;  Surgeon: Harvel Quale, MD;  [facility];  Service: Gastroenterology;  Laterality: N/A;   EYE SURGERY Bilateral    cataracts removed   FEMUR IM NAIL Left 10/28/2018   Procedure: RETROGRADE FEMORAL NAILING;  Surgeon: Meredith Pel, MD;  Location: [facility];  Service: Orthopedics;  Laterality: Left;   IM NAILING FEMORAL SHAFT RETROGRADE Left 10/28/2018   INTRAVASCULAR ULTRASOUND/IVUS N/A 10/18/2019   Procedure: Intravascular Ultrasound/IVUS;  Surgeon: Sherren Mocha, MD;  Location: [facility];  Service: Cardiovascular;  Laterality: N/A;   IR FLUORO GUIDE CV LINE RIGHT  08/11/2019   IR THORACENTESIS ASP PLEURAL SPACE W/IMG GUIDE  11/12/2019   IR US GUIDE VASC ACCESS RIGHT  08/11/2019   KNEE SURGERY Left    LEFT HEART CATH AND CORONARY ANGIOGRAPHY N/A 10/18/2019   Procedure: LEFT HEART CATH AND CORONARY ANGIOGRAPHY;  Surgeon: Sherren Mocha, MD;  Location: [facility];  Service: Cardiovascular;  Laterality: N/A;   PERIPHERAL VASCULAR BALLOON ANGIOPLASTY Left 01/12/2020   Procedure: PERIPHERAL VASCULAR BALLOON ANGIOPLASTY;  Surgeon: Serafina Mitchell, MD;  Location: [facility];  Service: Cardiovascular;  Laterality: Left;  AVF   PERIPHERAL VASCULAR BALLOON ANGIOPLASTY Left 03/17/2020    Procedure: PERIPHERAL VASCULAR BALLOON ANGIOPLASTY;  Surgeon: Marty Heck, MD;  Location: [facility];  Service: Cardiovascular;  Laterality: Left;  AVF   PERIPHERAL VASCULAR BALLOON ANGIOPLASTY Left 05/05/2020   Procedure: PERIPHERAL VASCULAR BALLOON ANGIOPLASTY;  Surgeon: Marty Heck, MD;  Location: [facility];  Service:  Cardiovascular;  Laterality: Left;  arm fistula   RADIOLOGY WITH ANESTHESIA N/A 09/15/2019   Procedure: [facility] AND LUMBER LOWER BACK PAIN;  Surgeon: Radiologist, Medication, MD;  Location: [facility];  Service: Radiology;  Laterality: N/A;   TUBAL LIGATION     Social History   Occupational History   Not on file  Tobacco Use   Smoking status: Former    Packs/day: 0.25    Years: 2.00    Pack years: 0.50    Types: Cigarettes    Quit date: 1997    Years since quitting: 26.0   Smokeless tobacco: Never  Vaping Use   Vaping Use: Never used  Substance and Sexual Activity   Alcohol use: No   Drug use: No   Sexual activity: Yes    Birth control/protection: Surgical    Comment: tubal

## 2021-07-20 ENCOUNTER — Telehealth: Payer: Self-pay | Admitting: Nurse Practitioner

## 2021-07-20 NOTE — Telephone Encounter (Signed)
Melanie Ala PA w/ [facility] called in on ot behalf  Pt needs follow up for recent lab work pt A1C / hemo is at 12.5   Called pt and LVM in regards to scheduling an appt

## 2021-07-31 ENCOUNTER — Other Ambulatory Visit: Payer: Self-pay

## 2021-07-31 ENCOUNTER — Encounter (INDEPENDENT_AMBULATORY_CARE_PROVIDER_SITE_OTHER): Payer: Self-pay | Admitting: Gastroenterology

## 2021-07-31 ENCOUNTER — Ambulatory Visit (INDEPENDENT_AMBULATORY_CARE_PROVIDER_SITE_OTHER): Payer: Medicaid Other | Admitting: Gastroenterology

## 2021-07-31 VITALS — BP 119/81 | HR 78 | Temp 97.4°F | Ht 63.0 in | Wt 181.9 lb

## 2021-07-31 DIAGNOSIS — K529 Noninfective gastroenteritis and colitis, unspecified: Secondary | ICD-10-CM

## 2021-07-31 DIAGNOSIS — K2100 Gastro-esophageal reflux disease with esophagitis, without bleeding: Secondary | ICD-10-CM

## 2021-07-31 MED ORDER — COLESTIPOL HCL 1 G PO TABS: 2.0000 g | ORAL_TABLET | Freq: Two times a day (BID) | ORAL | 3 refills | Status: DC

## 2021-07-31 NOTE — Patient Instructions (Addendum)
Schedule colonoscopy - please do not take any Imodium, antidiarrheals or Colestipol the week before this procedure Increase colestipol to 2 g twice a day

## 2021-07-31 NOTE — Progress Notes (Signed)
Melanie Hall, M.D. Gastroenterology & Hepatology [facility] [address] [place], [place] [address]  Primary Care Physician: No primary care provider on file. No primary provider on file.  I will communicate my assessment and recommendations to the referring MD via EMR.  Problems: Chronic diarrhea, ?diabetic diarrhea  History of Present Illness: Melanie Hall is a 51 y.o. female with past medical history of diabetes complicated by end-stage renal disease on hemodialysis, coronary artery disease and MI status post multiple stents in March 2021 on dual antiplatelet therapy, heart failure, GERD, hypertension, stroke and chronic diarrhea, who presents for follow up of chronic diarrhea.  The patient was last seen on 01/26/2021. At that time, the patient had her TSH checked which was within normal limits.  She was started on Colestid 2 g every day.  Pancreatic elastase levels were normal 410, stool electrolytes were checked with a stool osmolal gap of -10. underwent an EGD and colonoscopy on 02/28/2021 which showed grade B esophagitis, changes suggestive of Barrett's esophagus (this was ruled out as the biopsies came back positive for gastric mucosa without intestinal metaplasia), erosive gastropathy (negative for H. pylori or dysplasia), duodenitis with normal random small bowel biopsies.  Particularly, the colon showed extensive amount of solid stool throughout the colon interfering with visualization, random biopsies from the colon were inconsistent with microscopic colitis.  Patient reports that on average she is having a bowel movement two times a day with normal consistency. When she has an episode of diarrhea episode, she has several episodes for 3 hours - this happens 2 times a week. She is taking colestipol 2 g qday. She may have some abdominal pain when she has to strain to move her bowels but not every day. Has been taking Imodium 4-6  times a week. Overall, she feels better compared to the past as she has not presented any fecal soiling accidents or having to take large amount of antidiarrheals on a daily basis.  Denies any dysphagia, odynophagia or heartburn. Taking omeprazole 40 mg qday.  The patient denies having any fever, chills, hematochezia, melena, hematemesis, abdominal distention, jaundice, pruritus. Has gained weight recently.  Last EGD: As above Last Colonoscopy: As above  Past Medical History: Past Medical History:  Diagnosis Date   Acute cystitis without hematuria    Acute midline thoracic back pain 49/44/9675   Acute systolic heart failure ([place]) 10/21/2019   Anemia    Arthritis    CAD (coronary artery disease)    S/p late presenting Inf STEMI 10/2019 >> PCI: DES x 2 to RCA // Myoview 8/22: EF 54, no ischemia or infarction; low risk   Cataracts, bilateral    surgery to remove   CATARACTS, BILATERAL 07/02/2007   Qualifier: Diagnosis of  By: Isla Pence     Closed fracture of left femur ([place]) 10/28/2018   Closed fracture of right ankle 11/06/2017   Diabetes mellitus    type 2   Emphysematous cystitis 08/26/2018   Encounter for gynecological examination with Papanicolaou smear of cervix 01/14/2020   Encounter for screening fecal occult blood testing 01/14/2020   Encounter for screening for malignant neoplasm of cervix 12/29/2019   Encounter for screening mammogram for malignant neoplasm of breast 12/29/2019   ESRD on hemodialysis ([place])    MWF - in [place]   GERD (gastroesophageal reflux disease)    Hyperlipidemia    Hypertension    Hypokalemia 08/26/2018   IRREGULAR MENSES 09/14/2009   Qualifier: Diagnosis of  By: Hassell Done FNP, [place]     Ischemic cardiomyopathy    Echocardiogram 10/19/2019: EF 40-45, inf WMA, Gr 1 DD, Lg L pl Eff // EF 54 by Myoview in 8/22   Loose stools 11/12/2019   Normocytic anemia 08/26/2018   PARONYCHIA, RIGHT GREAT TOE 07/30/2008   Qualifier: Diagnosis of  By:  Hassell Done FNP, [place]kedtra     Pressure ulcer 09/10/2019   Right arm weakness 08/08/2019   Sprain of left ankle    STEMI (ST elevation myocardial infarction) ([place]) 10/18/2019   STEMI involving right coronary artery ([place]) 10/18/2019   Stroke ([place]) 10/2019   SVD (spontaneous vaginal delivery)    x 4   Vaginosis 08/26/2018   Weakness 09/10/2019   Weakness of both lower extremities     Past Surgical History: Past Surgical History:  Procedure Laterality Date   A/V FISTULAGRAM Left 01/12/2020   Procedure: A/V FISTULAGRAM;  Surgeon: Serafina Mitchell, MD;  Location: [place] [facility];  Service: Cardiovascular;  Laterality: Left;   A/V FISTULAGRAM N/A 03/17/2020   Procedure: A/V FISTULAGRAM - Left Arm;  Surgeon: Marty Heck, MD;  Location: [place] [facility];  Service: Cardiovascular;  Laterality: N/A;   A/V FISTULAGRAM Left 06/23/2020   Procedure: A/V FISTULAGRAM;  Surgeon: Marty Heck, MD;  Location: [place] [facility];  Service: Cardiovascular;  Laterality: Left;   ANKLE FUSION Left 06/01/2020   Procedure: LEFT ANKLE FUSION;  Surgeon: Newt Minion, MD;  Location: [place];  Service: Orthopedics;  Laterality: Left;   AV FISTULA PLACEMENT Left 08/17/2019   Procedure: LEFT BRACHIAL CEPHALIC ARTERIOVENOUS (AV) FISTULA;  Surgeon: Angelia Mould, MD;  Location: [place];  Service: Vascular;  Laterality: Left;   BIOPSY  02/28/2021   Procedure: BIOPSY;  Surgeon: Harvel Quale, MD;  [facility];  Service: Gastroenterology;;  duodenum gastric esophagus colon   COLONOSCOPY WITH PROPOFOL N/A 02/28/2021   Procedure: COLONOSCOPY WITH PROPOFOL;  Surgeon: Harvel Quale, MD;  [facility];  Service: Gastroenterology;  Laterality: N/A;  11:00   CORONARY STENT INTERVENTION N/A 10/18/2019   Procedure: CORONARY STENT INTERVENTION;  Surgeon: Sherren Mocha, MD;  Location: [place] [facility];  Service: Cardiovascular;  Laterality: N/A;   CORONARY/GRAFT  ACUTE MI REVASCULARIZATION N/A 10/18/2019   Procedure: Coronary/Graft Acute MI Revascularization;  Surgeon: Sherren Mocha, MD;  Location: [place] [facility];  Service: Cardiovascular;  Laterality: N/A;   ESOPHAGOGASTRODUODENOSCOPY (EGD) WITH PROPOFOL N/A 02/28/2021   Procedure: ESOPHAGOGASTRODUODENOSCOPY (EGD) WITH PROPOFOL;  Surgeon: Harvel Quale, MD;  [facility];  Service: Gastroenterology;  Laterality: N/A;   EYE SURGERY Bilateral    cataracts removed   FEMUR IM NAIL Left 10/28/2018   Procedure: RETROGRADE FEMORAL NAILING;  Surgeon: Meredith Pel, MD;  Location: [place];  Service: Orthopedics;  Laterality: Left;   IM NAILING FEMORAL SHAFT RETROGRADE Left 10/28/2018   INTRAVASCULAR ULTRASOUND/IVUS N/A 10/18/2019   Procedure: Intravascular Ultrasound/IVUS;  Surgeon: Sherren Mocha, MD;  Location: [place] [facility];  Service: Cardiovascular;  Laterality: N/A;   IR FLUORO GUIDE CV LINE RIGHT  08/11/2019   IR THORACENTESIS ASP PLEURAL SPACE W/IMG GUIDE  11/12/2019   IR US GUIDE VASC ACCESS RIGHT  08/11/2019   KNEE SURGERY Left    LEFT HEART CATH AND CORONARY ANGIOGRAPHY N/A 10/18/2019   Procedure: LEFT HEART CATH AND CORONARY ANGIOGRAPHY;  Surgeon: Sherren Mocha, MD;  Location: [place] [facility];  Service: Cardiovascular;  Laterality: N/A;  PERIPHERAL VASCULAR BALLOON ANGIOPLASTY Left 01/12/2020   Procedure: PERIPHERAL VASCULAR BALLOON ANGIOPLASTY;  Surgeon: Serafina Mitchell, MD;  Location: [place] [facility];  Service: Cardiovascular;  Laterality: Left;  AVF   PERIPHERAL VASCULAR BALLOON ANGIOPLASTY Left 03/17/2020   Procedure: PERIPHERAL VASCULAR BALLOON ANGIOPLASTY;  Surgeon: Marty Heck, MD;  Location: [place] [facility];  Service: Cardiovascular;  Laterality: Left;  AVF   PERIPHERAL VASCULAR BALLOON ANGIOPLASTY Left 05/05/2020   Procedure: PERIPHERAL VASCULAR BALLOON ANGIOPLASTY;  Surgeon: Marty Heck, MD;  Location: [place] [facility];  Service:  Cardiovascular;  Laterality: Left;  arm fistula   RADIOLOGY WITH ANESTHESIA N/A 09/15/2019   Procedure: [facility] AND LUMBER LOWER BACK PAIN;  Surgeon: Radiologist, Medication, MD;  Location: [place];  Service: Radiology;  Laterality: N/A;   TUBAL LIGATION      Family History: Family History  Problem Relation Age of Onset   Diabetes Mother    Hypertension Mother    Bipolar disorder Mother    Brain cancer Maternal Aunt    Heart disease Maternal Grandmother    Diabetes Maternal Grandmother    Breast cancer Maternal Aunt    Depression Father        Committed suicide   Diabetes Sister     Social History: Social History   Tobacco Use  Smoking Status Former   Packs/day: 0.25   Years: 2.00   Pack years: 0.50   Types: Cigarettes   Quit date: 1997   Years since quitting: 26.0  Smokeless Tobacco Never   Social History   Substance and Sexual Activity  Alcohol Use No   Social History   Substance and Sexual Activity  Drug Use No    Allergies: Allergies  Allergen Reactions   Contrast Media [Iodinated Contrast Media] Nausea And Vomiting    Treated with Benadryl & Solumedrol   Penicillins Other (See Comments)    Don't want to take PCN due to family history     Medications: Current Outpatient Medications  Medication Sig Dispense Refill   aspirin 81 MG EC tablet Take 1 tablet (81 mg total) by mouth daily. 90 tablet 3   atorvastatin (LIPITOR) 80 MG tablet Take 1 tablet (80 mg total) by mouth daily at 6 PM. 90 tablet 3   carvedilol (COREG) 12.5 MG tablet Take 1 tablet (12.5 mg total) by mouth 2 (two) times daily. 180 tablet 2   colestipol (COLESTID) 1 g tablet Take 1 tablet (1 g total) by mouth 2 (two) times daily. 180 tablet 3   epoetin alfa (EPOGEN) 3000 UNIT/ML injection Inject 3,000 Units into the skin every 14 (fourteen) days.      ezetimibe (ZETIA) 10 MG tablet Take 1 tablet (10 mg total) by mouth daily. 90 tablet 3   gabapentin (NEURONTIN) 300 MG capsule Take 1 capsule  (300 mg total) by mouth 3 (three) times daily. 90 capsule 5   hydrALAZINE (APRESOLINE) 10 MG tablet Take 1 tablet (10 mg total) by mouth in the morning and at bedtime. 180 tablet 3   insulin aspart protamine- aspart (NOVOLOG MIX 70/30) (70-30) 100 UNIT/ML injection Inject 0.1 mLs (10 Units total) into the skin 2 (two) times daily with a meal. 10 mL 3   isosorbide mononitrate (IMDUR) 30 MG 24 hr tablet Take 0.5 tablets (15 mg total) by mouth daily. 15 tablet 11   levothyroxine (SYNTHROID) 50 MCG tablet TAKE 1 TABLET BY MOUTH ONCE DAILY BEFORE BREAKFAST 30 tablet 0   lidocaine-prilocaine (EMLA) cream Apply topically.  loperamide (IMODIUM) 2 MG capsule Take by mouth 2 (two) times daily.     methocarbamol (ROBAXIN) 500 MG tablet Take 1 tablet (500 mg total) by mouth every 8 (eight) hours as needed for muscle spasms. 45 tablet 1   multivitamin (RENA-VIT) TABS tablet Take 1 tablet by mouth at bedtime. 30 tablet 0   nitroGLYCERIN (NITROSTAT) 0.4 MG SL tablet Place 1 tablet (0.4 mg total) under the tongue every 5 (five) minutes x 3 doses as needed for chest pain. 25 tablet 3   omeprazole (PRILOSEC) 40 MG capsule Take 1 capsule (40 mg total) by mouth daily. 90 capsule 3   ondansetron (ZOFRAN ODT) 4 MG disintegrating tablet Take 1 tablet (4 mg total) by mouth every 8 (eight) hours as needed for nausea or vomiting. 15 tablet 0   silver sulfADIAZINE (SILVADENE) 1 % cream Apply 1 application topically daily. 50 g 0   tamsulosin (FLOMAX) 0.4 MG CAPS capsule Take 1 capsule (0.4 mg total) by mouth daily after supper. 90 capsule 0   traMADol (ULTRAM) 50 MG tablet Take 1 tablet (50 mg total) by mouth every 12 (twelve) hours as needed. . She is on Hemodialysis. 60 tablet 4   Current Facility-Administered Medications  Medication Dose Route Frequency Provider Last Rate Last Admin   0.9 %  sodium chloride infusion  250 mL Intravenous PRN Marty Heck, MD       0.9 %  sodium chloride infusion  250 mL  Intravenous PRN Marty Heck, MD       sodium chloride flush (NS) 0.9 % injection 3 mL  3 mL Intravenous Q12H Marty Heck, MD       sodium chloride flush (NS) 0.9 % injection 3 mL  3 mL Intravenous PRN Marty Heck, MD        Review of Systems: GENERAL: negative for malaise, night sweats HEENT: No changes in hearing or vision, no nose bleeds or other nasal problems. NECK: Negative for lumps, goiter, pain and significant neck swelling RESPIRATORY: Negative for cough, wheezing CARDIOVASCULAR: Negative for chest pain, leg swelling, palpitations, orthopnea GI: SEE HPI MUSCULOSKELETAL: Negative for joint pain or swelling, back pain, and muscle pain. SKIN: Negative for lesions, rash PSYCH: Negative for sleep disturbance, mood disorder and recent psychosocial stressors. HEMATOLOGY Negative for prolonged bleeding, bruising easily, and swollen nodes. ENDOCRINE: Negative for cold or heat intolerance, polyuria, polydipsia and goiter. NEURO: negative for tremor, gait imbalance, syncope and seizures. The remainder of the review of systems is noncontributory.   Physical Exam: BP 119/81 (BP Location: Left Arm, Patient Position: Sitting, Cuff Size: Large)    Pulse 78    Temp (!) 97.4 F (36.3 C) (Oral)    Ht 5\' 3"  (1.6 m)    Wt 181 lb 14.4 oz (82.5 kg)    LMP 12/17/2016    BMI 32.22 kg/m  GENERAL: The patient is AO x3, in no acute distress. Sitting in wheelchair. HEENT: Head is normocephalic and atraumatic. EOMI are intact. Mouth is well hydrated and without lesions. NECK: Supple. No masses LUNGS: Clear to auscultation. No presence of rhonchi/wheezing/rales. Adequate chest expansion HEART: RRR, normal s1 and s2. ABDOMEN: Soft, nontender, no guarding, no peritoneal signs, and nondistended. BS +. No masses. EXTREMITIES: Without any cyanosis, clubbing, rash, lesions or edema. NEUROLOGIC: AOx3, no focal motor deficit. SKIN: no jaundice, no rashes  Imaging/Labs: as above  I  personally reviewed and interpreted the available labs, imaging and endoscopic files.  Impression and Plan: Melanie Stutsman  Hall is a 51 y.o. female with past medical history of diabetes complicated by end-stage renal disease on hemodialysis, coronary artery disease and MI status post multiple stents in March 2021 on dual antiplatelet therapy, heart failure, GERD, hypertension, stroke and chronic diarrhea, who presents for follow up of chronic diarrhea. The patient presented recurrent episodes of diarrhea which were initially difficult to control and led to a significant burden in her life; however, they have been better controlled with the use of colestipol recently. She had multiple investigations that have ruled out microscopic colitis, EPI, IBD, thyroid disease, and intraabdominal mases that could lead to persistent diarrhea. Possibly he diarrhea is related to diabetes as she has presented multiple complications from this disease. We will increase her colestipol to 2 g BID and she can continue with Imodium as needed for now.  She is intrerested in having a repeat colonoscopy for screening purposes. We will proceed with this with a 2 day prep. I advised her to not take  Imodium, antidiarrheals or Colestipol the week before this procedure.  - Schedule colonoscopy - please do not take any Imodium, antidiarrheals or Colestipol the week before this procedure - Increase colestipol to 2 g twice a day  All questions were answered.      Harvel Quale, MD Gastroenterology and Hepatology [facility]

## 2021-08-01 ENCOUNTER — Other Ambulatory Visit: Payer: Self-pay | Admitting: *Deleted

## 2021-08-01 DIAGNOSIS — E1122 Type 2 diabetes mellitus with diabetic chronic kidney disease: Secondary | ICD-10-CM

## 2021-08-07 ENCOUNTER — Other Ambulatory Visit (INDEPENDENT_AMBULATORY_CARE_PROVIDER_SITE_OTHER): Payer: Self-pay

## 2021-08-07 ENCOUNTER — Telehealth: Payer: Self-pay | Admitting: *Deleted

## 2021-08-07 ENCOUNTER — Telehealth (INDEPENDENT_AMBULATORY_CARE_PROVIDER_SITE_OTHER): Payer: Self-pay

## 2021-08-07 MED ORDER — PEG 3350-KCL-NA BICARB-NACL 420 G PO SOLR: 4000.0000 mL | ORAL | 0 refills | Status: AC

## 2021-08-07 NOTE — Telephone Encounter (Signed)
° °  Pre-operative Risk Assessment    Patient Name: Melanie Hall  DOB: 04/13/1971 MRN: [id number]      Request for Surgical Clearance    Procedure:   VITRECTOMY, ENDOLASER, PHOTOCOAGULATION AND INTRAVITREAL EYLEA IN THE LEFT EYE  Date of Surgery:  Clearance 08/28/21                                 Surgeon:  DR. Sherlynn Stalls  Surgeon's Group or Practice Name:  [facility] Phone number:  [phone] Fax number:  [phone]   Type of Clearance Requested:   - Medical  - Pharmacy:  Hold Aspirin x 7 DAYS PRIOR TO PROCEDURE   Type of Anesthesia:  MAC   Additional requests/questions:    Jiles Prows   08/07/2021, 5:35 PM

## 2021-08-07 NOTE — Telephone Encounter (Signed)
Melanie Hall Melanie Hall, CMA  ?

## 2021-08-08 ENCOUNTER — Encounter (INDEPENDENT_AMBULATORY_CARE_PROVIDER_SITE_OTHER): Payer: Self-pay

## 2021-08-08 NOTE — Telephone Encounter (Signed)
[name] 51 year old female is requesting preoperative cardiac evaluation for vitrectomy, endolaser, photocoagulation, and intra vitreal Eylea of the left eye.  She was last seen in the clinic on 02/07/2021.  She was doing well at that time.  She underwent stress testing 02/16/2021 which showed an EF of 54%, low risk, and was a normal study.  Her PMH includes coronary artery disease status post late presenting STEMI 4/21.  She had PCI with DES x2 to RCA, chronic systolic CHF, ischemic cardiomyopathy EF 40-45%, G1 DD, ES RD, diabetes mellitus, HTN, HLD.   May her aspirin be held prior to her procedure?  Thank you for your help.  Please direct your response to CV DIV preop pool.  Jossie Ng. Jye Fariss NP-C    08/08/2021, 9:43 AM [address] Office [phone] Fax [phone]

## 2021-08-08 NOTE — Telephone Encounter (Signed)
Pt at low risk of holding ASA x 7 days if needed for surgery. OK to do so from cardiac standpoint. thanks

## 2021-08-09 ENCOUNTER — Other Ambulatory Visit: Payer: Self-pay

## 2021-08-09 ENCOUNTER — Ambulatory Visit (HOSPITAL_COMMUNITY): Payer: Medicaid Other | Attending: Orthopedic Surgery | Admitting: Physical Therapy

## 2021-08-09 ENCOUNTER — Encounter (HOSPITAL_COMMUNITY): Payer: Self-pay | Admitting: Physical Therapy

## 2021-08-09 DIAGNOSIS — M545 Low back pain, unspecified: Secondary | ICD-10-CM | POA: Diagnosis present

## 2021-08-09 DIAGNOSIS — R2689 Other abnormalities of gait and mobility: Secondary | ICD-10-CM | POA: Insufficient documentation

## 2021-08-09 DIAGNOSIS — R296 Repeated falls: Secondary | ICD-10-CM | POA: Insufficient documentation

## 2021-08-09 DIAGNOSIS — R262 Difficulty in walking, not elsewhere classified: Secondary | ICD-10-CM | POA: Diagnosis present

## 2021-08-09 DIAGNOSIS — M6281 Muscle weakness (generalized): Secondary | ICD-10-CM | POA: Diagnosis present

## 2021-08-09 DIAGNOSIS — M546 Pain in thoracic spine: Secondary | ICD-10-CM | POA: Diagnosis present

## 2021-08-09 DIAGNOSIS — R29898 Other symptoms and signs involving the musculoskeletal system: Secondary | ICD-10-CM | POA: Diagnosis not present

## 2021-08-09 DIAGNOSIS — G8929 Other chronic pain: Secondary | ICD-10-CM | POA: Insufficient documentation

## 2021-08-09 NOTE — Therapy (Signed)
[place], [place], [address] Phone: [phone]   Fax:  [phone]  Physical Therapy Evaluation  Patient Details  Name: Melanie Hall MRN: [id number] Date of Birth: [birth year] Referring Provider (PT): Meridee Score MD   Encounter Date: 08/09/2021   PT End of Session - 08/09/21 0933     Visit Number 1    Number of Visits 12    Date for PT Re-Evaluation 09/20/21    Authorization Type Medicaid (check auth)    Authorization - Visit Number 1    Authorization - Number of Visits 1    PT Start Time 0906    PT Stop Time 0943    PT Time Calculation (min) 37 min    Activity Tolerance Patient tolerated treatment well;Patient limited by fatigue    Behavior During Therapy [facility] for tasks assessed/performed             Past Medical History:  Diagnosis Date   Acute cystitis without hematuria    Acute midline thoracic back pain 29/52/8413   Acute systolic heart failure ([place]) 10/21/2019   Anemia    Arthritis    CAD (coronary artery disease)    S/p late presenting Inf STEMI 10/2019 >> PCI: DES x 2 to RCA // Myoview 8/22: EF 54, no ischemia or infarction; low risk   Cataracts, bilateral    surgery to remove   CATARACTS, BILATERAL 07/02/2007   Qualifier: Diagnosis of  By: Isla Pence     Closed fracture of left femur ([place]) 10/28/2018   Closed fracture of right ankle 11/06/2017   Diabetes mellitus    type 2   Emphysematous cystitis 08/26/2018   Encounter for gynecological examination with Papanicolaou smear of cervix 01/14/2020   Encounter for screening fecal occult blood testing 01/14/2020   Encounter for screening for malignant neoplasm of cervix 12/29/2019   Encounter for screening mammogram for malignant neoplasm of breast 12/29/2019   ESRD on hemodialysis ([place])    MWF - in [place]   GERD (gastroesophageal reflux disease)    Hyperlipidemia    Hypertension    Hypokalemia 08/26/2018   IRREGULAR MENSES  09/14/2009   Qualifier: Diagnosis of  By: Hassell Done FNP, [place]     Ischemic cardiomyopathy    Echocardiogram 10/19/2019: EF 40-45, inf WMA, Gr 1 DD, Lg L pl Eff // EF 54 by Myoview in 8/22   Loose stools 11/12/2019   Normocytic anemia 08/26/2018   PARONYCHIA, RIGHT GREAT TOE 07/30/2008   Qualifier: Diagnosis of  By: Hassell Done FNP, [place]ked[place]     Pressure ulcer 09/10/2019   Right arm weakness 08/08/2019   Sprain of left ankle    STEMI (ST elevation myocardial infarction) ([place]) 10/18/2019   STEMI involving right coronary artery ([place]) 10/18/2019   Stroke ([place]) 10/2019   SVD (spontaneous vaginal delivery)    x 4   Vaginosis 08/26/2018   Weakness 09/10/2019   Weakness of both lower extremities     Past Surgical History:  Procedure Laterality Date   A/V FISTULAGRAM Left 01/12/2020   Procedure: A/V FISTULAGRAM;  Surgeon: Serafina Mitchell, MD;  Location: [facility];  Service: Cardiovascular;  Laterality: Left;   A/V FISTULAGRAM N/A 03/17/2020   Procedure: A/V FISTULAGRAM - Left Arm;  Surgeon: Marty Heck, MD;  Location: [place] [facility];  Service: Cardiovascular;  Laterality: N/A;   A/V FISTULAGRAM Left 06/23/2020   Procedure: A/V FISTULAGRAM;  Surgeon: Marty Heck, MD;  Location: [facility]  INVASIVE CV LAB;  Service: Cardiovascular;  Laterality: Left;   ANKLE FUSION Left 06/01/2020   Procedure: LEFT ANKLE FUSION;  Surgeon: Newt Minion, MD;  Location: [place];  Service: Orthopedics;  Laterality: Left;   AV FISTULA PLACEMENT Left 08/17/2019   Procedure: LEFT BRACHIAL CEPHALIC ARTERIOVENOUS (AV) FISTULA;  Surgeon: Angelia Mould, MD;  Location: [place];  Service: Vascular;  Laterality: Left;   BIOPSY  02/28/2021   Procedure: BIOPSY;  Surgeon: Harvel Quale, MD;  [facility];  Service: Gastroenterology;;  duodenum gastric esophagus colon   COLONOSCOPY WITH PROPOFOL N/A 02/28/2021   Procedure: COLONOSCOPY WITH PROPOFOL;  Surgeon: Harvel Quale, MD;  [facility];  Service: Gastroenterology;  Laterality: N/A;  11:00   CORONARY STENT INTERVENTION N/A 10/18/2019   Procedure: CORONARY STENT INTERVENTION;  Surgeon: Sherren Mocha, MD;  Location: [place] [facility];  Service: Cardiovascular;  Laterality: N/A;   CORONARY/GRAFT ACUTE MI REVASCULARIZATION N/A 10/18/2019   Procedure: Coronary/Graft Acute MI Revascularization;  Surgeon: Sherren Mocha, MD;  Location: [place] [facility];  Service: Cardiovascular;  Laterality: N/A;   ESOPHAGOGASTRODUODENOSCOPY (EGD) WITH PROPOFOL N/A 02/28/2021   Procedure: ESOPHAGOGASTRODUODENOSCOPY (EGD) WITH PROPOFOL;  Surgeon: Harvel Quale, MD;  [facility];  Service: Gastroenterology;  Laterality: N/A;   EYE SURGERY Bilateral    cataracts removed   FEMUR IM NAIL Left 10/28/2018   Procedure: RETROGRADE FEMORAL NAILING;  Surgeon: Meredith Pel, MD;  Location: [place];  Service: Orthopedics;  Laterality: Left;   IM NAILING FEMORAL SHAFT RETROGRADE Left 10/28/2018   INTRAVASCULAR ULTRASOUND/IVUS N/A 10/18/2019   Procedure: Intravascular Ultrasound/IVUS;  Surgeon: Sherren Mocha, MD;  Location: [place] [facility];  Service: Cardiovascular;  Laterality: N/A;   IR FLUORO GUIDE CV LINE RIGHT  08/11/2019   IR THORACENTESIS ASP PLEURAL SPACE W/IMG GUIDE  11/12/2019   IR US GUIDE VASC ACCESS RIGHT  08/11/2019   KNEE SURGERY Left    LEFT HEART CATH AND CORONARY ANGIOGRAPHY N/A 10/18/2019   Procedure: LEFT HEART CATH AND CORONARY ANGIOGRAPHY;  Surgeon: Sherren Mocha, MD;  Location: [place];  Service: Cardiovascular;  Laterality: N/A;   PERIPHERAL VASCULAR BALLOON ANGIOPLASTY Left 01/12/2020   Procedure: PERIPHERAL VASCULAR BALLOON ANGIOPLASTY;  Surgeon: Serafina Mitchell, MD;  Location: [facility];  Service: Cardiovascular;  Laterality: Left;  AVF   PERIPHERAL VASCULAR BALLOON ANGIOPLASTY Left 03/17/2020   Procedure: PERIPHERAL VASCULAR BALLOON ANGIOPLASTY;  Surgeon:  Marty Heck, MD;  Location: [place] [facility];  Service: Cardiovascular;  Laterality: Left;  AVF   PERIPHERAL VASCULAR BALLOON ANGIOPLASTY Left 05/05/2020   Procedure: PERIPHERAL VASCULAR BALLOON ANGIOPLASTY;  Surgeon: Marty Heck, MD;  Location: [place] [facility];  Service: Cardiovascular;  Laterality: Left;  arm fistula   RADIOLOGY WITH ANESTHESIA N/A 09/15/2019   Procedure: [facility] AND LUMBER LOWER BACK PAIN;  Surgeon: Radiologist, Medication, MD;  Location: [place];  Service: Radiology;  Laterality: N/A;   TUBAL LIGATION      There were no vitals filed for this visit.    Subjective Assessment - 08/09/21 0916     Subjective Patient presents to therapy with complaint of BLE weakness. She had an injury to her LT ankle years ago which causes some ligamentous laxity. She has since gotten AFOs in the fall. These have helped her a little but she still feels very limited in her mobility and ability to perform gait. She presents to today for strengthening and  gait.    Limitations Standing;Walking;House hold activities    How long can you stand comfortably? <5 minutes    How long can you walk comfortably? <5 minutes    Patient Stated Goals Improve strength and walk better    Currently in Pain? Yes    Pain Score 8     Pain Location Knee    Pain Orientation Left    Pain Descriptors / Indicators Aching    Pain Type Chronic pain    Pain Onset More than a month ago    Pain Frequency Constant    Aggravating Factors  WB    Pain Relieving Factors non WB, meds    Effect of Pain on Daily Activities limits                [facility] PT Assessment - 08/09/21 0001       Assessment   Medical Diagnosis BLE weakness    Referring Provider (PT) Meridee Score MD    Prior Therapy Yes      Precautions   Precautions Fall      Restrictions   Weight Bearing Restrictions No      Balance Screen   Has the patient fallen in the past 6 months Yes    How many times? 1    Has the patient had  a decrease in activity level because of a fear of falling?  No    Is the patient reluctant to leave their home because of a fear of falling?  No      Home Social worker Private residence    Living Arrangements Alone      Prior Function   Level of Independence Independent      Cognition   Overall Cognitive Status Within Functional Limits for tasks assessed      Observation/Other Assessments   Observations Bilateral AFOs      ROM / Strength   AROM / PROM / Strength AROM;Strength      Strength   Strength Assessment Site Hip;Knee;Ankle    Right/Left Hip Right;Left    Right Hip Flexion 4/5    Right Hip ABduction 4/5    Left Hip Flexion 4/5    Left Hip ABduction 4/5    Right/Left Knee Right;Left    Right Knee Extension 4/5    Left Knee Extension 4/5    Right/Left Ankle Right;Left    Right Ankle Dorsiflexion 2+/5    Left Ankle Dorsiflexion 3-/5      Transfers   Five time sit to stand comments  23 seconds with use of RUE      Ambulation/Gait   Ambulation/Gait Yes    Ambulation/Gait Assistance 5: Supervision    Ambulation Distance (Feet) 130 Feet    Assistive device Rollator    Gait Pattern Decreased stride length;Decreased step length - right;Decreased step length - left;Trunk flexed    Ambulation Surface Level;Indoor    Gait Comments 2MWT                        Objective measurements completed on examination: See above findings.       [place] Adult PT Treatment/Exercise - 08/09/21 0001       Exercises   Exercises Knee/Hip      Knee/Hip Exercises: Seated   Other Seated Knee/Hip Exercises marching and LAQs x10 each                     PT  Education - 08/09/21 0918     Education Details on evaluation, POC and HEP    Person(s) Educated Patient    Methods Explanation;Handout    Comprehension Verbalized understanding              PT Short Term Goals - 08/09/21 0936       PT SHORT TERM GOAL #1   Title Patient  will be independent with initial HEP and self-management strategies to improve functional outcomes    Time 3    Period Weeks    Status New    Target Date 08/30/21               PT Long Term Goals - 08/09/21 0936       PT LONG TERM GOAL #1   Title Patient will be independent with advanced HEP and self-management strategies to improve functional outcomes    Time 6    Period Weeks    Status New    Target Date 09/20/21      PT LONG TERM GOAL #2   Title Patient will be able to ambulate at least 250 feet during 2MWT with LRAD to demonstrate improved ability to perform functional mobility and associated tasks.    Time 6    Period Weeks    Status New    Target Date 09/20/21      PT LONG TERM GOAL #3   Title Patient will have equal to or > 4+/5 MMT throughout BLE (except RT DF due to hx of TIA)  to improve ability to perform functional mobility, stair ambulation and ADLs.    Time 6    Period Weeks    Status New    Target Date 09/20/21      PT LONG TERM GOAL #4   Title Patient will be able to perform stand x 5 in < 15 seconds to demonstrate improvement in functional mobility and reduced risk for falls.    Time 6    Period Weeks    Status New    Target Date 09/20/21                    Plan - 08/09/21 0934     Clinical Impression Statement Patient is a 51 y.o. female who presents to physical therapy with complaint of BLE weakness. Patient demonstrates decreased strength, balance deficits and gait abnormalities which are likely contributing to symptoms of pain and are negatively impacting patient ability to perform ADLs and functional mobility tasks. Patient will benefit from skilled physical therapy services to address these deficits to reduce pain, improve level of function with ADLs, functional mobility tasks, and reduce risk for falls.    Personal Factors and Comorbidities Time since onset of injury/illness/exacerbation;Past/Current Experience;Fitness     Examination-Activity Limitations Locomotion Level;Transfers;Stand;Stairs    Examination-Participation Restrictions Cleaning;School;Community Activity    Stability/Clinical Decision Making Stable/Uncomplicated    Clinical Decision Making Low    Rehab Potential Fair    PT Frequency 2x / week    PT Duration 6 weeks    PT Treatment/Interventions ADLs/Self Care Home Management;Ultrasound;Parrafin;Neuromuscular re-education;Compression bandaging;Visual/perceptual remediation/compensation;Scar mobilization;Fluidtherapy;Contrast Bath;Aquatic Therapy;Biofeedback;DME Instruction;Gait training;Patient/family education;Orthotic Fit/Training;Passive range of motion;Dry needling;Energy conservation;Manual techniques;Vasopneumatic Device;Taping;Splinting;Joint Manipulations;Spinal Manipulations;Cryotherapy;Stair training;Functional mobility training;Electrical Stimulation;Iontophoresis 4mg /ml Dexamethasone;Therapeutic activities;Therapeutic exercise;Moist Heat;Traction;Balance training;Manual lymph drainage    PT Next Visit Plan Progress LE strengthening balance and gait as tolerated    PT Home Exercise Plan Eval: seated march, LAQ    Consulted and Agree with Plan of Care Patient  Patient will benefit from skilled therapeutic intervention in order to improve the following deficits and impairments:  Abnormal gait, Decreased endurance, Increased edema, Decreased activity tolerance, Decreased strength, Pain, Decreased balance, Decreased mobility, Difficulty walking, Postural dysfunction, Improper body mechanics, Decreased range of motion  Visit Diagnosis: Muscle weakness (generalized)  Other abnormalities of gait and mobility     Problem List Patient Active Problem List   Diagnosis Date Noted   CAD (coronary artery disease) 02/17/2021   Ischemic cardiomyopathy 02/17/2021   Pain and tenderness 11/10/2020   Encounter for support and coordination of transition of care 06/16/2020   S/P ankle  fusion 06/01/2020   Pilon fracture of left tibia, closed, initial encounter    Displaced trimalleolar fracture of left lower leg, initial encounter for closed fracture    Abnormality of gait and mobility 05/26/2020   Migraine 05/26/2020   Diabetic retinopathy ([place]) 05/04/2020   Chronic diarrhea of unknown origin 01/14/2020   Type 2 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, with long-term current use of insulin ([place]) 12/29/2019   Hypothyroidism 12/29/2019   Weakness of both lower extremities 11/12/2019   Debility 09/10/2019   ESRD (end stage renal disease) on dialysis ([place]) 09/10/2019   Depression 09/10/2019   Incontinence in female 09/10/2019   Anemia in chronic kidney disease 08/19/2019   MGUS (monoclonal gammopathy of unknown significance) 07/23/2019   GERD (gastroesophageal reflux disease) 02/28/2019   Essential hypertension 10/28/2018   Irregular menses 09/14/2009   Hypercholesterolemia 11/19/2008   Type 2 diabetes with nephropathy ([place]) 07/02/2007   9:40 AM, 08/09/21 Josue Hector PT DPT  Physical Therapist with [facility]  [phone] [place], [place], [address] Phone: [phone]   Fax:  [phone]  Name: Melanie Hall MRN: [id number] Date of Birth: 1971/01/04

## 2021-08-10 NOTE — Telephone Encounter (Signed)
Left a message for the patient to call back and speak to the on-call preop APP of the day.  Also informed to the patient in the message to hold aspirin for 7 days prior to the procedure.

## 2021-08-11 ENCOUNTER — Encounter (HOSPITAL_COMMUNITY): Payer: Medicaid Other

## 2021-08-11 NOTE — Telephone Encounter (Signed)
Called patient and LVM  to call back.

## 2021-08-14 ENCOUNTER — Other Ambulatory Visit: Payer: Self-pay

## 2021-08-14 ENCOUNTER — Telehealth: Payer: Self-pay

## 2021-08-14 ENCOUNTER — Ambulatory Visit (HOSPITAL_COMMUNITY): Payer: Medicaid Other | Admitting: Physical Therapy

## 2021-08-14 DIAGNOSIS — R2689 Other abnormalities of gait and mobility: Secondary | ICD-10-CM

## 2021-08-14 DIAGNOSIS — M6281 Muscle weakness (generalized): Secondary | ICD-10-CM | POA: Diagnosis not present

## 2021-08-14 NOTE — Therapy (Signed)
[place], [place], [address] Phone: [phone]   Fax:  [phone]  Physical Therapy Treatment  Patient Details  Name: Melanie Hall MRN: [id number] Date of Birth: 22-Aug-1970 Referring Provider (PT): Meridee Score MD   Encounter Date: 08/14/2021   PT End of Session - 08/14/21 1235     Visit Number 2    Number of Visits 12    Date for PT Re-Evaluation 09/20/21    Authorization Type Medicaid (check auth)    Authorization - Visit Number 2    Authorization - Number of Visits 1    PT Start Time 2637    PT Stop Time 8588    PT Time Calculation (min) 30 min    Activity Tolerance Patient tolerated treatment well;Patient limited by fatigue    Behavior During Therapy [facility] for tasks assessed/performed             Past Medical History:  Diagnosis Date   Acute cystitis without hematuria    Acute midline thoracic back pain 50/27/7412   Acute systolic heart failure ([place]) 10/21/2019   Anemia    Arthritis    CAD (coronary artery disease)    S/p late presenting Inf STEMI 10/2019 >> PCI: DES x 2 to RCA // Myoview 8/22: EF 54, no ischemia or infarction; low risk   Cataracts, bilateral    surgery to remove   CATARACTS, BILATERAL 07/02/2007   Qualifier: Diagnosis of  By: Isla Pence     Closed fracture of left femur ([place]) 10/28/2018   Closed fracture of right ankle 11/06/2017   Diabetes mellitus    type 2   Emphysematous cystitis 08/26/2018   Encounter for gynecological examination with Papanicolaou smear of cervix 01/14/2020   Encounter for screening fecal occult blood testing 01/14/2020   Encounter for screening for malignant neoplasm of cervix 12/29/2019   Encounter for screening mammogram for malignant neoplasm of breast 12/29/2019   ESRD on hemodialysis ([place])    MWF - in [place]   GERD (gastroesophageal reflux disease)    Hyperlipidemia    Hypertension    Hypokalemia 08/26/2018   IRREGULAR MENSES  09/14/2009   Qualifier: Diagnosis of  By: Hassell Done FNP, [place]     Ischemic cardiomyopathy    Echocardiogram 10/19/2019: EF 40-45, inf WMA, Gr 1 DD, Lg L pl Eff // EF 54 by Myoview in 8/22   Loose stools 11/12/2019   Normocytic anemia 08/26/2018   PARONYCHIA, RIGHT GREAT TOE 07/30/2008   Qualifier: Diagnosis of  By: Hassell Done FNP, [place]     Pressure ulcer 09/10/2019   Right arm weakness 08/08/2019   Sprain of left ankle    STEMI (ST elevation myocardial infarction) ([place]) 10/18/2019   STEMI involving right coronary artery ([place]) 10/18/2019   Stroke ([place]) 10/2019   SVD (spontaneous vaginal delivery)    x 4   Vaginosis 08/26/2018   Weakness 09/10/2019   Weakness of both lower extremities     Past Surgical History:  Procedure Laterality Date   A/V FISTULAGRAM Left 01/12/2020   Procedure: A/V FISTULAGRAM;  Surgeon: Serafina Mitchell, MD;  Location: [place] [facility];  Service: Cardiovascular;  Laterality: Left;   A/V FISTULAGRAM N/A 03/17/2020   Procedure: A/V FISTULAGRAM - Left Arm;  Surgeon: Marty Heck, MD;  Location: [place] [facility];  Service: Cardiovascular;  Laterality: N/A;   A/V FISTULAGRAM Left 06/23/2020   Procedure: A/V FISTULAGRAM;  Surgeon: Marty Heck, MD;  Location: [facility]  INVASIVE CV LAB;  Service: Cardiovascular;  Laterality: Left;   ANKLE FUSION Left 06/01/2020   Procedure: LEFT ANKLE FUSION;  Surgeon: Newt Minion, MD;  Location: [place];  Service: Orthopedics;  Laterality: Left;   AV FISTULA PLACEMENT Left 08/17/2019   Procedure: LEFT BRACHIAL CEPHALIC ARTERIOVENOUS (AV) FISTULA;  Surgeon: Angelia Mould, MD;  Location: [place];  Service: Vascular;  Laterality: Left;   BIOPSY  02/28/2021   Procedure: BIOPSY;  Surgeon: Harvel Quale, MD;  [facility];  Service: Gastroenterology;;  duodenum gastric esophagus colon   COLONOSCOPY WITH PROPOFOL N/A 02/28/2021   Procedure: COLONOSCOPY WITH PROPOFOL;  Surgeon: Harvel Quale, MD;  [facility];  Service: Gastroenterology;  Laterality: N/A;  11:00   CORONARY STENT INTERVENTION N/A 10/18/2019   Procedure: CORONARY STENT INTERVENTION;  Surgeon: Sherren Mocha, MD;  Location: [place] [facility];  Service: Cardiovascular;  Laterality: N/A;   CORONARY/GRAFT ACUTE MI REVASCULARIZATION N/A 10/18/2019   Procedure: Coronary/Graft Acute MI Revascularization;  Surgeon: Sherren Mocha, MD;  Location: [place] [facility];  Service: Cardiovascular;  Laterality: N/A;   ESOPHAGOGASTRODUODENOSCOPY (EGD) WITH PROPOFOL N/A 02/28/2021   Procedure: ESOPHAGOGASTRODUODENOSCOPY (EGD) WITH PROPOFOL;  Surgeon: Harvel Quale, MD;  [facility];  Service: Gastroenterology;  Laterality: N/A;   EYE SURGERY Bilateral    cataracts removed   FEMUR IM NAIL Left 10/28/2018   Procedure: RETROGRADE FEMORAL NAILING;  Surgeon: Meredith Pel, MD;  Location: [place];  Service: Orthopedics;  Laterality: Left;   IM NAILING FEMORAL SHAFT RETROGRADE Left 10/28/2018   INTRAVASCULAR ULTRASOUND/IVUS N/A 10/18/2019   Procedure: Intravascular Ultrasound/IVUS;  Surgeon: Sherren Mocha, MD;  Location: [place] [facility];  Service: Cardiovascular;  Laterality: N/A;   IR FLUORO GUIDE CV LINE RIGHT  08/11/2019   IR THORACENTESIS ASP PLEURAL SPACE W/IMG GUIDE  11/12/2019   IR US GUIDE VASC ACCESS RIGHT  08/11/2019   KNEE SURGERY Left    LEFT HEART CATH AND CORONARY ANGIOGRAPHY N/A 10/18/2019   Procedure: LEFT HEART CATH AND CORONARY ANGIOGRAPHY;  Surgeon: Sherren Mocha, MD;  Location: [place] [facility];  Service: Cardiovascular;  Laterality: N/A;   PERIPHERAL VASCULAR BALLOON ANGIOPLASTY Left 01/12/2020   Procedure: PERIPHERAL VASCULAR BALLOON ANGIOPLASTY;  Surgeon: Serafina Mitchell, MD;  Location: [place] [facility];  Service: Cardiovascular;  Laterality: Left;  AVF   PERIPHERAL VASCULAR BALLOON ANGIOPLASTY Left 03/17/2020   Procedure: PERIPHERAL VASCULAR BALLOON ANGIOPLASTY;  Surgeon:  Marty Heck, MD;  Location: [place] [facility];  Service: Cardiovascular;  Laterality: Left;  AVF   PERIPHERAL VASCULAR BALLOON ANGIOPLASTY Left 05/05/2020   Procedure: PERIPHERAL VASCULAR BALLOON ANGIOPLASTY;  Surgeon: Marty Heck, MD;  Location: [place] [facility];  Service: Cardiovascular;  Laterality: Left;  arm fistula   RADIOLOGY WITH ANESTHESIA N/A 09/15/2019   Procedure: [facility] AND LUMBER LOWER BACK PAIN;  Surgeon: Radiologist, Medication, MD;  Location: [place];  Service: Radiology;  Laterality: N/A;   TUBAL LIGATION      There were no vitals filed for this visit.   Subjective Assessment - 08/14/21 1134     Subjective pt states her Lt LE is swollen today and states she is going to the MD about it when she leaves here.  STates both her legs are hurting with "squeezing" pain and has to force herself to move them. Currently 10/10 but denies need to go to ED    Currently in Pain? Yes    Pain  Score 10-Worst pain ever    Pain Location Leg    Pain Orientation Right;Left    Pain Descriptors / Indicators Aching;Tightness                               [facility] Adult PT Treatment/Exercise - 08/14/21 0001       Knee/Hip Exercises: Standing   Heel Raises Both;10 reps    Heel Raises Limitations difficulty pushing up wanting to rock and bend her knees    Hip Flexion Left;10 reps    Hip Abduction Left;10 reps    Hip Extension Left;10 reps      Knee/Hip Exercises: Seated   Other Seated Knee/Hip Exercises marching and LAQs x10 each      Knee/Hip Exercises: Supine   Heel Slides Both;5 sets    Bridges 5 reps                       PT Short Term Goals - 08/09/21 0936       PT SHORT TERM GOAL #1   Title Patient will be independent with initial HEP and self-management strategies to improve functional outcomes    Time 3    Period Weeks    Status New    Target Date 08/30/21               PT Long Term Goals - 08/09/21 0936        PT LONG TERM GOAL #1   Title Patient will be independent with advanced HEP and self-management strategies to improve functional outcomes    Time 6    Period Weeks    Status New    Target Date 09/20/21      PT LONG TERM GOAL #2   Title Patient will be able to ambulate at least 250 feet during 2MWT with LRAD to demonstrate improved ability to perform functional mobility and associated tasks.    Time 6    Period Weeks    Status New    Target Date 09/20/21      PT LONG TERM GOAL #3   Title Patient will have equal to or > 4+/5 MMT throughout BLE (except RT DF due to hx of TIA)  to improve ability to perform functional mobility, stair ambulation and ADLs.    Time 6    Period Weeks    Status New    Target Date 09/20/21      PT LONG TERM GOAL #4   Title Patient will be able to perform stand x 5 in < 15 seconds to demonstrate improvement in functional mobility and reduced risk for falls.    Time 6    Period Weeks    Status New    Target Date 09/20/21                   Plan - 08/14/21 1240     Clinical Impression Statement Began with standing exercises, however unable to complete for Rt LE due to Lt LE pain.  Constant cues for standing upright as tends to bend forward.  Educated this may be why her back hurts and encouraged to maintain upright.  Completed session with non-WB exercises due to complaints of LE pain and edema.  Pt also with complaints of pain and inability to complete exercises in non-weight bearing and began crying with pain.  Discontinued session at this point until sees MD and determine cause for  increased pain    Personal Factors and Comorbidities Time since onset of injury/illness/exacerbation;Past/Current Experience;Fitness    Examination-Activity Limitations Locomotion Level;Transfers;Stand;Stairs    Examination-Participation Restrictions Cleaning;School;Community Activity    Stability/Clinical Decision Making Stable/Uncomplicated    Rehab Potential Fair     PT Frequency 2x / week    PT Duration 6 weeks    PT Treatment/Interventions ADLs/Self Care Home Management;Ultrasound;Parrafin;Neuromuscular re-education;Compression bandaging;Visual/perceptual remediation/compensation;Scar mobilization;Fluidtherapy;Contrast Bath;Aquatic Therapy;Biofeedback;DME Instruction;Gait training;Patient/family education;Orthotic Fit/Training;Passive range of motion;Dry needling;Energy conservation;Manual techniques;Vasopneumatic Device;Taping;Splinting;Joint Manipulations;Spinal Manipulations;Cryotherapy;Stair training;Functional mobility training;Electrical Stimulation;Iontophoresis 4mg /ml Dexamethasone;Therapeutic activities;Therapeutic exercise;Moist Heat;Traction;Balance training;Manual lymph drainage    PT Next Visit Plan Progress LE strengthening balance and gait as tolerated.  F/U with MD appt for LE edema and pain.    PT Home Exercise Plan Eval: seated march, LAQ    Consulted and Agree with Plan of Care Patient             Patient will benefit from skilled therapeutic intervention in order to improve the following deficits and impairments:  Abnormal gait, Decreased endurance, Increased edema, Decreased activity tolerance, Decreased strength, Pain, Decreased balance, Decreased mobility, Difficulty walking, Postural dysfunction, Improper body mechanics, Decreased range of motion  Visit Diagnosis: Muscle weakness (generalized)  Other abnormalities of gait and mobility     Problem List Patient Active Problem List   Diagnosis Date Noted   CAD (coronary artery disease) 02/17/2021   Ischemic cardiomyopathy 02/17/2021   Pain and tenderness 11/10/2020   Encounter for support and coordination of transition of care 06/16/2020   S/P ankle fusion 06/01/2020   Pilon fracture of left tibia, closed, initial encounter    Displaced trimalleolar fracture of left lower leg, initial encounter for closed fracture    Abnormality of gait and mobility 05/26/2020   Migraine  05/26/2020   Diabetic retinopathy ([place]) 05/04/2020   Chronic diarrhea of unknown origin 01/14/2020   Type 2 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, with long-term current use of insulin ([place]) 12/29/2019   Hypothyroidism 12/29/2019   Weakness of both lower extremities 11/12/2019   Debility 09/10/2019   ESRD (end stage renal disease) on dialysis ([place]) 09/10/2019   Depression 09/10/2019   Incontinence in female 09/10/2019   Anemia in chronic kidney disease 08/19/2019   MGUS (monoclonal gammopathy of unknown significance) 07/23/2019   GERD (gastroesophageal reflux disease) 02/28/2019   Essential hypertension 10/28/2018   Irregular menses 09/14/2009   Hypercholesterolemia 11/19/2008   Type 2 diabetes with nephropathy [facility]) 07/02/2007   Teena Irani, PTA/CLT, WTA [phone]  Teena Irani, PTA 08/14/2021, 12:42 PM  [facility] [address] [place], [place], [address] Phone: [phone]   Fax:  [phone]  Name: Melanie Hall MRN: [id number] Date of Birth: 19-Aug-1970

## 2021-08-14 NOTE — Telephone Encounter (Signed)
PA for Tramadol submitted to [facility] Tracks

## 2021-08-14 NOTE — Telephone Encounter (Signed)
Patient is returning call.  °

## 2021-08-14 NOTE — Telephone Encounter (Signed)
Left message to call back and ask to speak with preop team.  Darreld Mclean, PA-C 08/14/2021 12:44 PM

## 2021-08-15 NOTE — Telephone Encounter (Signed)
° °  Name: Melanie Hall  DOB: 11/05/1970  MRN: [id number]   Primary Cardiologist: Sherren Mocha, MD  Chart reviewed as part of pre-operative protocol coverage. Patient was contacted 08/15/2021 in reference to pre-operative risk assessment for pending surgery as outlined below.  Melanie Hall was last seen 02/2021 by Richardson Dopp, PA-C, history reviewed. At last OV, stress testing was performed for pre-op clearance of unrelated procedure and was normal. Per Dr. Burt Knack, "Pt at low risk of holding ASA x 7 days if needed for surgery. OK to do so from cardiac standpoint." I reached out to patient for update on how she is doing. The patient affirms she has been doing well without any new cardiac symptoms. Therefore, based on ACC/AHA guidelines, the patient would be at acceptable risk for the planned procedure without further cardiovascular testing. The patient was advised that if she develops new symptoms prior to surgery to contact our office to arrange for a follow-up visit, and she verbalized understanding.  I will route this recommendation to the requesting party via Epic fax function and remove from pre-op pool. Please call with questions.  Charlie Pitter, PA-C 08/15/2021, 9:15 AM

## 2021-08-15 NOTE — Telephone Encounter (Signed)
Approved.  

## 2021-08-16 ENCOUNTER — Ambulatory Visit (HOSPITAL_COMMUNITY): Payer: Medicaid Other | Admitting: Physical Therapy

## 2021-08-16 ENCOUNTER — Other Ambulatory Visit: Payer: Self-pay

## 2021-08-16 DIAGNOSIS — M6281 Muscle weakness (generalized): Secondary | ICD-10-CM

## 2021-08-16 DIAGNOSIS — R2689 Other abnormalities of gait and mobility: Secondary | ICD-10-CM

## 2021-08-16 NOTE — Therapy (Signed)
[facility] [address] [place], [place], [address] Phone: [phone]   Fax:  [phone]  Physical Therapy Treatment  Patient Details  Name: Melanie Hall MRN: [id number] Date of Birth: [birth year] Referring Provider (PT): Meridee Score MD   Encounter Date: 08/16/2021   PT End of Session - 08/16/21 1013     Visit Number 3    Number of Visits 12    Date for PT Re-Evaluation 09/20/21    Authorization Type Medicaid (check auth) 3 visits approved after eval (4 total including eval)    Authorization - Visit Number 3    Authorization - Number of Visits 4    PT Start Time 0915    PT Stop Time 1000    PT Time Calculation (min) 45 min    Activity Tolerance Patient tolerated treatment well;Patient limited by fatigue    Behavior During Therapy [facility] for tasks assessed/performed             Past Medical History:  Diagnosis Date   Acute cystitis without hematuria    Acute midline thoracic back pain 62/69/4854   Acute systolic heart failure ([place]) 10/21/2019   Anemia    Arthritis    CAD (coronary artery disease)    S/p late presenting Inf STEMI 10/2019 >> PCI: DES x 2 to RCA // Myoview 8/22: EF 54, no ischemia or infarction; low risk   Cataracts, bilateral    surgery to remove   CATARACTS, BILATERAL 07/02/2007   Qualifier: Diagnosis of  By: Isla Pence     Closed fracture of left femur ([place]) 10/28/2018   Closed fracture of right ankle 11/06/2017   Diabetes mellitus    type 2   Emphysematous cystitis 08/26/2018   Encounter for gynecological examination with Papanicolaou smear of cervix 01/14/2020   Encounter for screening fecal occult blood testing 01/14/2020   Encounter for screening for malignant neoplasm of cervix 12/29/2019   Encounter for screening mammogram for malignant neoplasm of breast 12/29/2019   ESRD on hemodialysis ([place])    MWF - in [place]   GERD (gastroesophageal reflux disease)    Hyperlipidemia     Hypertension    Hypokalemia 08/26/2018   IRREGULAR MENSES 09/14/2009   Qualifier: Diagnosis of  By: Hassell Done FNP, [place]     Ischemic cardiomyopathy    Echocardiogram 10/19/2019: EF 40-45, inf WMA, Gr 1 DD, Lg L pl Eff // EF 54 by Myoview in 8/22   Loose stools 11/12/2019   Normocytic anemia 08/26/2018   PARONYCHIA, RIGHT GREAT TOE 07/30/2008   Qualifier: Diagnosis of  By: Hassell Done FNP, [place]ked[place]     Pressure ulcer 09/10/2019   Right arm weakness 08/08/2019   Sprain of left ankle    STEMI (ST elevation myocardial infarction) ([place]) 10/18/2019   STEMI involving right coronary artery ([place]) 10/18/2019   Stroke ([place]) 10/2019   SVD (spontaneous vaginal delivery)    x 4   Vaginosis 08/26/2018   Weakness 09/10/2019   Weakness of both lower extremities     Past Surgical History:  Procedure Laterality Date   A/V FISTULAGRAM Left 01/12/2020   Procedure: A/V FISTULAGRAM;  Surgeon: Serafina Mitchell, MD;  Location: [facility];  Service: Cardiovascular;  Laterality: Left;   A/V FISTULAGRAM N/A 03/17/2020   Procedure: A/V FISTULAGRAM - Left Arm;  Surgeon: Marty Heck, MD;  Location: [facility];  Service: Cardiovascular;  Laterality: N/A;   A/V FISTULAGRAM Left 06/23/2020   Procedure: A/V FISTULAGRAM;  Surgeon: Marty Heck, MD;  Location: [facility];  Service: Cardiovascular;  Laterality: Left;   ANKLE FUSION Left 06/01/2020   Procedure: LEFT ANKLE FUSION;  Surgeon: Newt Minion, MD;  Location: [facility];  Service: Orthopedics;  Laterality: Left;   AV FISTULA PLACEMENT Left 08/17/2019   Procedure: LEFT BRACHIAL CEPHALIC ARTERIOVENOUS (AV) FISTULA;  Surgeon: Angelia Mould, MD;  Location: [facility];  Service: Vascular;  Laterality: Left;   BIOPSY  02/28/2021   Procedure: BIOPSY;  Surgeon: Harvel Quale, MD;  [facility];  Service: Gastroenterology;;  duodenum gastric esophagus colon   COLONOSCOPY WITH PROPOFOL N/A 02/28/2021   Procedure:  COLONOSCOPY WITH PROPOFOL;  Surgeon: Harvel Quale, MD;  [facility];  Service: Gastroenterology;  Laterality: N/A;  11:00   CORONARY STENT INTERVENTION N/A 10/18/2019   Procedure: CORONARY STENT INTERVENTION;  Surgeon: Sherren Mocha, MD;  Location: [facility];  Service: Cardiovascular;  Laterality: N/A;   CORONARY/GRAFT ACUTE MI REVASCULARIZATION N/A 10/18/2019   Procedure: Coronary/Graft Acute MI Revascularization;  Surgeon: Sherren Mocha, MD;  Location: [facility];  Service: Cardiovascular;  Laterality: N/A;   ESOPHAGOGASTRODUODENOSCOPY (EGD) WITH PROPOFOL N/A 02/28/2021   Procedure: ESOPHAGOGASTRODUODENOSCOPY (EGD) WITH PROPOFOL;  Surgeon: Harvel Quale, MD;  [facility];  Service: Gastroenterology;  Laterality: N/A;   EYE SURGERY Bilateral    cataracts removed   FEMUR IM NAIL Left 10/28/2018   Procedure: RETROGRADE FEMORAL NAILING;  Surgeon: Meredith Pel, MD;  Location: [facility];  Service: Orthopedics;  Laterality: Left;   IM NAILING FEMORAL SHAFT RETROGRADE Left 10/28/2018   INTRAVASCULAR ULTRASOUND/IVUS N/A 10/18/2019   Procedure: Intravascular Ultrasound/IVUS;  Surgeon: Sherren Mocha, MD;  Location: [facility];  Service: Cardiovascular;  Laterality: N/A;   IR FLUORO GUIDE CV LINE RIGHT  08/11/2019   IR THORACENTESIS ASP PLEURAL SPACE W/IMG GUIDE  11/12/2019   IR US GUIDE VASC ACCESS RIGHT  08/11/2019   KNEE SURGERY Left    LEFT HEART CATH AND CORONARY ANGIOGRAPHY N/A 10/18/2019   Procedure: LEFT HEART CATH AND CORONARY ANGIOGRAPHY;  Surgeon: Sherren Mocha, MD;  Location: [facility];  Service: Cardiovascular;  Laterality: N/A;   PERIPHERAL VASCULAR BALLOON ANGIOPLASTY Left 01/12/2020   Procedure: PERIPHERAL VASCULAR BALLOON ANGIOPLASTY;  Surgeon: Serafina Mitchell, MD;  Location: [facility];  Service: Cardiovascular;  Laterality: Left;  AVF   PERIPHERAL VASCULAR BALLOON ANGIOPLASTY Left 03/17/2020    Procedure: PERIPHERAL VASCULAR BALLOON ANGIOPLASTY;  Surgeon: Marty Heck, MD;  Location: [facility];  Service: Cardiovascular;  Laterality: Left;  AVF   PERIPHERAL VASCULAR BALLOON ANGIOPLASTY Left 05/05/2020   Procedure: PERIPHERAL VASCULAR BALLOON ANGIOPLASTY;  Surgeon: Marty Heck, MD;  Location: [facility];  Service: Cardiovascular;  Laterality: Left;  arm fistula   RADIOLOGY WITH ANESTHESIA N/A 09/15/2019   Procedure: [facility] AND LUMBER LOWER BACK PAIN;  Surgeon: Radiologist, Medication, MD;  Location: [facility];  Service: Radiology;  Laterality: N/A;   TUBAL LIGATION      There were no vitals filed for this visit.   Subjective Assessment - 08/16/21 0922     Subjective pt states she got her appt times mixed up and doesn't see Dr Sharol Given until march 6th.  STates her Lt ankle is not as swollen today as it was Monday but is still hurting.  Pt requests to do NWB exercises this session.    Currently in Pain? Yes  Pain Score 5     Pain Location Leg    Pain Orientation Left    Pain Descriptors / Indicators Throbbing;Tightness;Sore                               [facility] Adult PT Treatment/Exercise - 08/16/21 0001       Knee/Hip Exercises: Seated   Other Seated Knee/Hip Exercises marching and LAQs x10 each      Knee/Hip Exercises: Supine   Heel Slides 15 reps    Bridges 2 sets;10 reps    Straight Leg Raises Both;10 reps    Straight Leg Raises Limitations 2X5 reps on Lt, all 10 reps on Rt    Other Supine Knee/Hip Exercises lower trunk rotations 10 reps      Knee/Hip Exercises: Sidelying   Hip ABduction Both;10 reps      Knee/Hip Exercises: Prone   Hamstring Curl 10 reps    Hip Extension Both;10 reps    Hip Extension Limitations with knee bent and AA for tracking/keeping upright                       PT Short Term Goals - 08/09/21 0936       PT SHORT TERM GOAL #1   Title Patient will be independent with initial HEP  and self-management strategies to improve functional outcomes    Time 3    Period Weeks    Status New    Target Date 08/30/21               PT Long Term Goals - 08/09/21 0936       PT LONG TERM GOAL #1   Title Patient will be independent with advanced HEP and self-management strategies to improve functional outcomes    Time 6    Period Weeks    Status New    Target Date 09/20/21      PT LONG TERM GOAL #2   Title Patient will be able to ambulate at least 250 feet during 2MWT with LRAD to demonstrate improved ability to perform functional mobility and associated tasks.    Time 6    Period Weeks    Status New    Target Date 09/20/21      PT LONG TERM GOAL #3   Title Patient will have equal to or > 4+/5 MMT throughout BLE (except RT DF due to hx of TIA)  to improve ability to perform functional mobility, stair ambulation and ADLs.    Time 6    Period Weeks    Status New    Target Date 09/20/21      PT LONG TERM GOAL #4   Title Patient will be able to perform stand x 5 in < 15 seconds to demonstrate improvement in functional mobility and reduced risk for falls.    Time 6    Period Weeks    Status New    Target Date 09/20/21                   Plan - 08/16/21 1014     Clinical Impression Statement Pt still with pain and edema in Lt LE and unable to sed the MD yesterday due to date mix-up.  Continued with NWB exercises (per pt request) with noted weakness completing all activities today.  Lt SLR most challenging and only able to complete 5 reps with rest break.  Completed sidelying  hip abduction with reduced ROM due to weakness.  Unable to complete prone hip extension without knee bent due to weakness and required AA form therapist to keep leg upright and prevent ER.  Pt will continue to benefit from physical therapy to work on impairments.    Personal Factors and Comorbidities Time since onset of injury/illness/exacerbation;Past/Current Experience;Fitness     Examination-Activity Limitations Locomotion Level;Transfers;Stand;Stairs    Examination-Participation Restrictions Cleaning;School;Community Activity    Stability/Clinical Decision Making Stable/Uncomplicated    Rehab Potential Fair    PT Frequency 2x / week    PT Duration 6 weeks    PT Treatment/Interventions ADLs/Self Care Home Management;Ultrasound;Parrafin;Neuromuscular re-education;Compression bandaging;Visual/perceptual remediation/compensation;Scar mobilization;Fluidtherapy;Contrast Bath;Aquatic Therapy;Biofeedback;DME Instruction;Gait training;Patient/family education;Orthotic Fit/Training;Passive range of motion;Dry needling;Energy conservation;Manual techniques;Vasopneumatic Device;Taping;Splinting;Joint Manipulations;Spinal Manipulations;Cryotherapy;Stair training;Functional mobility training;Electrical Stimulation;Iontophoresis 4mg /ml Dexamethasone;Therapeutic activities;Therapeutic exercise;Moist Heat;Traction;Balance training;Manual lymph drainage    PT Next Visit Plan Progress LE strengthening balance and gait as tolerated.  F/U with MD appt for LE edema and pain.  Complete insurance assessment next session.    PT Home Exercise Plan Eval: seated march, LAQ    Consulted and Agree with Plan of Care Patient             Patient will benefit from skilled therapeutic intervention in order to improve the following deficits and impairments:  Abnormal gait, Decreased endurance, Increased edema, Decreased activity tolerance, Decreased strength, Pain, Decreased balance, Decreased mobility, Difficulty walking, Postural dysfunction, Improper body mechanics, Decreased range of motion  Visit Diagnosis: Muscle weakness (generalized)  Other abnormalities of gait and mobility     Problem List Patient Active Problem List   Diagnosis Date Noted   CAD (coronary artery disease) 02/17/2021   Ischemic cardiomyopathy 02/17/2021   Pain and tenderness 11/10/2020   Encounter for support and  coordination of transition of care 06/16/2020   S/P ankle fusion 06/01/2020   Pilon fracture of left tibia, closed, initial encounter    Displaced trimalleolar fracture of left lower leg, initial encounter for closed fracture    Abnormality of gait and mobility 05/26/2020   Migraine 05/26/2020   Diabetic retinopathy ([place]) 05/04/2020   Chronic diarrhea of unknown origin 01/14/2020   Type 2 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, with long-term current use of insulin ([place]) 12/29/2019   Hypothyroidism 12/29/2019   Weakness of both lower extremities 11/12/2019   Debility 09/10/2019   ESRD (end stage renal disease) on dialysis ([place]) 09/10/2019   Depression 09/10/2019   Incontinence in female 09/10/2019   Anemia in chronic kidney disease 08/19/2019   MGUS (monoclonal gammopathy of unknown significance) 07/23/2019   GERD (gastroesophageal reflux disease) 02/28/2019   Essential hypertension 10/28/2018   Irregular menses 09/14/2009   Hypercholesterolemia 11/19/2008   Type 2 diabetes with nephropathy [facility]) 07/02/2007   Teena Irani, PTA/CLT, WTA [phone]  Teena Irani, PTA 08/16/2021, 10:29 AM  [facility] [address] [place], [place], [address] Phone: [phone]   Fax:  [phone]  Name: Melanie Hall MRN: [id number] Date of Birth: [birth year]

## 2021-08-18 ENCOUNTER — Encounter (HOSPITAL_COMMUNITY): Admission: RE | Admit: 2021-08-18 | Payer: No Typology Code available for payment source | Source: Ambulatory Visit

## 2021-08-22 ENCOUNTER — Ambulatory Visit (HOSPITAL_COMMUNITY): Admission: RE | Admit: 2021-08-22 | Payer: Medicaid Other | Source: Home / Self Care | Admitting: Gastroenterology

## 2021-08-22 ENCOUNTER — Encounter (HOSPITAL_COMMUNITY): Admission: RE | Payer: Self-pay | Source: Home / Self Care

## 2021-08-22 SURGERY — COLONOSCOPY WITH PROPOFOL
Anesthesia: Monitor Anesthesia Care

## 2021-08-23 ENCOUNTER — Ambulatory Visit (HOSPITAL_COMMUNITY): Payer: Medicaid Other | Admitting: Physical Therapy

## 2021-08-23 ENCOUNTER — Other Ambulatory Visit: Payer: Self-pay

## 2021-08-23 DIAGNOSIS — R296 Repeated falls: Secondary | ICD-10-CM

## 2021-08-23 DIAGNOSIS — M6281 Muscle weakness (generalized): Secondary | ICD-10-CM

## 2021-08-23 DIAGNOSIS — M546 Pain in thoracic spine: Secondary | ICD-10-CM

## 2021-08-23 DIAGNOSIS — M545 Low back pain, unspecified: Secondary | ICD-10-CM

## 2021-08-23 DIAGNOSIS — R2689 Other abnormalities of gait and mobility: Secondary | ICD-10-CM

## 2021-08-23 DIAGNOSIS — R262 Difficulty in walking, not elsewhere classified: Secondary | ICD-10-CM

## 2021-08-23 NOTE — Progress Notes (Unsigned)
Cardiology Office Note    Date:  08/23/2021   ID:  Melanie Hall, DOB 05-26-1971, MRN [id number]   PCP:  Pcp, No   [place]  Cardiologist:  Sherren Mocha, MD *** Advanced Practice Provider:  Liliane Shi, PA-C Electrophysiologist:  None   [id number]   No chief complaint on file.   History of Present Illness:  Melanie Hall is a 51 y.o. female with history of CAD status post late presentation inferior STEMI 10/2019 treated with DES to the RCA x2 EF 40 to 45%.  NST 02/16/2021 low risk mildly reduced EF cleared for surgery, hyperlipidemia, ESRD on HD, hypertension, DM    Past Medical History:  Diagnosis Date   Acute cystitis without hematuria    Acute midline thoracic back pain 94/76/5465   Acute systolic heart failure ([place]) 10/21/2019   Anemia    Arthritis    CAD (coronary artery disease)    S/p late presenting Inf STEMI 10/2019 >> PCI: DES x 2 to RCA // Myoview 8/22: EF 54, no ischemia or infarction; low risk   Cataracts, bilateral    surgery to remove   CATARACTS, BILATERAL 07/02/2007   Qualifier: Diagnosis of  By: Isla Pence     Closed fracture of left femur ([place]) 10/28/2018   Closed fracture of right ankle 11/06/2017   Diabetes mellitus    type 2   Emphysematous cystitis 08/26/2018   Encounter for gynecological examination with Papanicolaou smear of cervix 01/14/2020   Encounter for screening fecal occult blood testing 01/14/2020   Encounter for screening for malignant neoplasm of cervix 12/29/2019   Encounter for screening mammogram for malignant neoplasm of breast 12/29/2019   ESRD on hemodialysis ([place])    MWF - in [place]   GERD (gastroesophageal reflux disease)    Hyperlipidemia    Hypertension    Hypokalemia 08/26/2018   IRREGULAR MENSES 09/14/2009   Qualifier: Diagnosis of  By: Hassell Done FNP, [place]     Ischemic cardiomyopathy    Echocardiogram 10/19/2019: EF 40-45, inf WMA, Gr 1 DD, Lg L pl Eff // EF 54 by Myoview in  8/22   Loose stools 11/12/2019   Normocytic anemia 08/26/2018   PARONYCHIA, RIGHT GREAT TOE 07/30/2008   Qualifier: Diagnosis of  By: Hassell Done FNP, [place]     Pressure ulcer 09/10/2019   Right arm weakness 08/08/2019   Sprain of left ankle    STEMI (ST elevation myocardial infarction) ([place]) 10/18/2019   STEMI involving right coronary artery ([place]) 10/18/2019   Stroke ([place]) 10/2019   SVD (spontaneous vaginal delivery)    x 4   Vaginosis 08/26/2018   Weakness 09/10/2019   Weakness of both lower extremities     Past Surgical History:  Procedure Laterality Date   A/V FISTULAGRAM Left 01/12/2020   Procedure: A/V FISTULAGRAM;  Surgeon: Serafina Mitchell, MD;  Location: [facility];  Service: Cardiovascular;  Laterality: Left;   A/V FISTULAGRAM N/A 03/17/2020   Procedure: A/V FISTULAGRAM - Left Arm;  Surgeon: Marty Heck, MD;  Location: [facility];  Service: Cardiovascular;  Laterality: N/A;   A/V FISTULAGRAM Left 06/23/2020   Procedure: A/V FISTULAGRAM;  Surgeon: Marty Heck, MD;  Location: [facility];  Service: Cardiovascular;  Laterality: Left;   ANKLE FUSION Left 06/01/2020   Procedure: LEFT ANKLE FUSION;  Surgeon: Newt Minion, MD;  Location: [facility];  Service: Orthopedics;  Laterality: Left;   AV FISTULA PLACEMENT Left 08/17/2019  Procedure: LEFT BRACHIAL CEPHALIC ARTERIOVENOUS (AV) FISTULA;  Surgeon: Angelia Mould, MD;  Location: [facility];  Service: Vascular;  Laterality: Left;   BIOPSY  02/28/2021   Procedure: BIOPSY;  Surgeon: Harvel Quale, MD;  [facility];  Service: Gastroenterology;;  duodenum gastric esophagus colon   COLONOSCOPY WITH PROPOFOL N/A 02/28/2021   Procedure: COLONOSCOPY WITH PROPOFOL;  Surgeon: Harvel Quale, MD;  [facility];  Service: Gastroenterology;  Laterality: N/A;  11:00   CORONARY STENT INTERVENTION N/A 10/18/2019   Procedure: CORONARY STENT INTERVENTION;  Surgeon:  Sherren Mocha, MD;  Location: [facility];  Service: Cardiovascular;  Laterality: N/A;   CORONARY/GRAFT ACUTE MI REVASCULARIZATION N/A 10/18/2019   Procedure: Coronary/Graft Acute MI Revascularization;  Surgeon: Sherren Mocha, MD;  Location: [facility];  Service: Cardiovascular;  Laterality: N/A;   ESOPHAGOGASTRODUODENOSCOPY (EGD) WITH PROPOFOL N/A 02/28/2021   Procedure: ESOPHAGOGASTRODUODENOSCOPY (EGD) WITH PROPOFOL;  Surgeon: Harvel Quale, MD;  [facility];  Service: Gastroenterology;  Laterality: N/A;   EYE SURGERY Bilateral    cataracts removed   FEMUR IM NAIL Left 10/28/2018   Procedure: RETROGRADE FEMORAL NAILING;  Surgeon: Meredith Pel, MD;  Location: [facility];  Service: Orthopedics;  Laterality: Left;   IM NAILING FEMORAL SHAFT RETROGRADE Left 10/28/2018   INTRAVASCULAR ULTRASOUND/IVUS N/A 10/18/2019   Procedure: Intravascular Ultrasound/IVUS;  Surgeon: Sherren Mocha, MD;  Location: [facility];  Service: Cardiovascular;  Laterality: N/A;   IR FLUORO GUIDE CV LINE RIGHT  08/11/2019   IR THORACENTESIS ASP PLEURAL SPACE W/IMG GUIDE  11/12/2019   IR US GUIDE VASC ACCESS RIGHT  08/11/2019   KNEE SURGERY Left    LEFT HEART CATH AND CORONARY ANGIOGRAPHY N/A 10/18/2019   Procedure: LEFT HEART CATH AND CORONARY ANGIOGRAPHY;  Surgeon: Sherren Mocha, MD;  Location: [facility];  Service: Cardiovascular;  Laterality: N/A;   PERIPHERAL VASCULAR BALLOON ANGIOPLASTY Left 01/12/2020   Procedure: PERIPHERAL VASCULAR BALLOON ANGIOPLASTY;  Surgeon: Serafina Mitchell, MD;  Location: [facility];  Service: Cardiovascular;  Laterality: Left;  AVF   PERIPHERAL VASCULAR BALLOON ANGIOPLASTY Left 03/17/2020   Procedure: PERIPHERAL VASCULAR BALLOON ANGIOPLASTY;  Surgeon: Marty Heck, MD;  Location: [facility];  Service: Cardiovascular;  Laterality: Left;  AVF   PERIPHERAL VASCULAR BALLOON ANGIOPLASTY Left 05/05/2020   Procedure:  PERIPHERAL VASCULAR BALLOON ANGIOPLASTY;  Surgeon: Marty Heck, MD;  Location: [facility];  Service: Cardiovascular;  Laterality: Left;  arm fistula   RADIOLOGY WITH ANESTHESIA N/A 09/15/2019   Procedure: [facility] AND LUMBER LOWER BACK PAIN;  Surgeon: Radiologist, Medication, MD;  Location: [facility];  Service: Radiology;  Laterality: N/A;   TUBAL LIGATION      Current Medications: No outpatient medications have been marked as taking for the 09/04/21 encounter (Appointment) with Imogene Burn, PA-C.   Current Facility-Administered Medications for the 09/04/21 encounter (Appointment) with Imogene Burn, PA-C  Medication   0.9 %  sodium chloride infusion   0.9 %  sodium chloride infusion   sodium chloride flush (NS) 0.9 % injection 3 mL   sodium chloride flush (NS) 0.9 % injection 3 mL     Allergies:   Contrast media [iodinated contrast media] and Penicillins   Social History   Socioeconomic History   Marital status: Widowed    Spouse name: Not on file   Number of children: 4   Years of education: Not on file  Highest education level: 10th grade  Occupational History   Not on file  Tobacco Use   Smoking status: Former    Packs/day: 0.25    Years: 2.00    Pack years: 0.50    Types: Cigarettes    Quit date: 62    Years since quitting: 26.1   Smokeless tobacco: Never  Vaping Use   Vaping Use: Never used  Substance and Sexual Activity   Alcohol use: No   Drug use: No   Sexual activity: Yes    Birth control/protection: Surgical    Comment: tubal   Other Topics Concern   Not on file  Social History Narrative   Lives with friend with [place]   Cats: Tiger, [place], [place], 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      Enjoy: watching TV-[employer], [employer], and [employer]      Diet: eats all food groups: focuses on Dm and Kidney friendly foods    Caffeine: none   Water: Fluid Restriction 32 ounces a day          Wears seat belt: not currently driving    Oceanographer at home    No  weapons    Social Determinants of Health   Financial Resource Strain: Not on file  Food Insecurity: Not on file  Transportation Needs: Not on file  Physical Activity: Not on file  Stress: Not on file  Social Connections: Not on file     Family History:  The patient's ***family history includes Bipolar disorder in her mother; Brain cancer in her maternal aunt; Breast cancer in her maternal aunt; Depression in her father; Diabetes in her maternal grandmother, mother, and sister; Heart disease in her maternal grandmother; Hypertension in her mother.   ROS:   Please see the history of present illness.    ROS All other systems reviewed and are negative.   PHYSICAL EXAM:   VS:  LMP 12/17/2016   Physical Exam  GEN: Well nourished, well developed, in no acute distress  HEENT: normal  Neck: no JVD, carotid bruits, or masses Cardiac:RRR; no murmurs, rubs, or gallops  Respiratory:  clear to auscultation bilaterally, normal work of breathing GI: soft, nontender, nondistended, + BS Ext: without cyanosis, clubbing, or edema, Good distal pulses bilaterally MS: no deformity or atrophy  Skin: warm and dry, no rash Neuro:  Alert and Oriented x 3, Strength and sensation are intact Psych: euthymic mood, full affect  Wt Readings from Last 3 Encounters:  07/31/21 181 lb 14.4 oz (82.5 kg)  05/12/21 168 lb 6.4 oz (76.4 kg)  02/28/21 151 lb (68.5 kg)      Studies/Labs Reviewed:   EKG:  EKG is*** ordered today.  The ekg ordered today demonstrates ***  Recent Labs: 02/14/2021: Platelets 195; TSH 5.570 02/16/2021: ALT 5 02/28/2021: BUN 35; Creatinine, Ser 5.90; Hemoglobin 10.9; Potassium 5.1; Sodium 136   Lipid Panel    Component Value Date/Time   CHOL 130 02/16/2021 0800   TRIG 95 02/16/2021 0800   HDL 36 (L) 02/16/2021 0800   CHOLHDL 3.6 02/16/2021 0800   CHOLHDL 3.4 10/19/2019 0034   VLDL 27 10/19/2019 0034   LDLCALC 76 02/16/2021 0800    Additional studies/ records that were  reviewed today include:  NST 8/11/2022Nuclear stress EF: 54%. The left ventricular ejection fraction is mildly decreased (45-54%). There was no ST segment deviation noted during stress. The study is normal. This is a low risk study.   Normal resting and stress perfusion. No ischemia or infarction EF 54%  Nuclear stress EF: 54%. The left ventricular ejection fraction is mildly decreased (45-54%). There was no ST segment deviation noted during stress. The study is normal. This is a low risk study.   Normal resting and stress perfusion. No ischemia or infarction EF 54%    Limited Echocardiogram 10/19/2019 EF 40-45, inferior septum, inferior wall and posterior wall hypokinetic, mild conc LVH, Gr 1 DD, Large L pleural Eff, mild MR   Cardiac catheterization 10/18/2019 LAD irregularities LCx irregularities RCA mid 50, distal 95 (thrombotic) Inferior scar, EF 35, moderate MR PCI: 3.5 x 18 mm Resolute Onyx DES x2 to the RCA   Echocardiogram 10/13/2019 EF 60-65, normal RV SF, moderately elevated PASP (RVSP 43.9), trivial MR   Echocardiogram 08/14/2019 Small pericardial effusion without tamponade physiology, EF 55-60, moderate LVH, RVSP 32.9   Echocardiogram 08/09/2019 EF 65-70  Risk Assessment/Calculations:   {Does this patient have ATRIAL FIBRILLATION?:[id number]}     ASSESSMENT:    No diagnosis found.   PLAN:  In order of problems listed above:  CAD status post inferior STEMI 10/2019 treated with DES to the RCA x2 no disease elsewhere, NST 02/16/2021 low risk no ischemia cleared for surgery, EF mildly reduced reduced 45 to 54%  Heart failure reduced EF 45% on echo 4/2021on hydralazine isosorbide and carvedilol  ESRD on dialysis Monday Wednesday Friday Hypertension  Hyperlipidemia   Shared Decision Making/Informed Consent   {Are you ordering a CV Procedure (e.g. stress test, cath, DCCV, TEE, etc)?   Press F2        :[id number]}    Medication Adjustments/Labs and Tests  Ordered: Current medicines are reviewed at length with the patient today.  Concerns regarding medicines are outlined above.  Medication changes, Labs and Tests ordered today are listed in the Patient Instructions below. There are no Patient Instructions on file for this visit.   Signed, Ermalinda Barrios, PA-C  08/23/2021 2:40 PM    [facility] [address], [place], [place]  [address] Phone: [phone]; Fax: [phone]

## 2021-08-23 NOTE — Therapy (Signed)
[place], [place], [address] Phone: [phone]   Fax:  [phone]  Physical Therapy Treatment  Patient Details  Name: Melanie Hall MRN: [id number] Date of Birth: [birth year] Referring Provider (PT): Meridee Score MD   Encounter Date: 08/23/2021   PT End of Session - 08/23/21 0847     Visit Number 4    Number of Visits 12    Date for PT Re-Evaluation 09/20/21    Authorization Type Medicaid (check auth) 3 visits approved after eval (4 total including eval)    Authorization - Visit Number 4    Authorization - Number of Visits 4    PT Start Time 0848    PT Stop Time 0928    PT Time Calculation (min) 40 min    Equipment Utilized During Treatment Gait belt    Activity Tolerance Patient tolerated treatment well;Patient limited by fatigue    Behavior During Therapy [facility] for tasks assessed/performed             Past Medical History:  Diagnosis Date   Acute cystitis without hematuria    Acute midline thoracic back pain 94/50/3888   Acute systolic heart failure ([place]) 10/21/2019   Anemia    Arthritis    CAD (coronary artery disease)    S/p late presenting Inf STEMI 10/2019 >> PCI: DES x 2 to RCA // Myoview 8/22: EF 54, no ischemia or infarction; low risk   Cataracts, bilateral    surgery to remove   CATARACTS, BILATERAL 07/02/2007   Qualifier: Diagnosis of  By: Isla Pence     Closed fracture of left femur ([place]) 10/28/2018   Closed fracture of right ankle 11/06/2017   Diabetes mellitus    type 2   Emphysematous cystitis 08/26/2018   Encounter for gynecological examination with Papanicolaou smear of cervix 01/14/2020   Encounter for screening fecal occult blood testing 01/14/2020   Encounter for screening for malignant neoplasm of cervix 12/29/2019   Encounter for screening mammogram for malignant neoplasm of breast 12/29/2019   ESRD on hemodialysis ([place])    MWF - in [place]   GERD (gastroesophageal  reflux disease)    Hyperlipidemia    Hypertension    Hypokalemia 08/26/2018   IRREGULAR MENSES 09/14/2009   Qualifier: Diagnosis of  By: Hassell Done FNP, [place]     Ischemic cardiomyopathy    Echocardiogram 10/19/2019: EF 40-45, inf WMA, Gr 1 DD, Lg L pl Eff // EF 54 by Myoview in 8/22   Loose stools 11/12/2019   Normocytic anemia 08/26/2018   PARONYCHIA, RIGHT GREAT TOE 07/30/2008   Qualifier: Diagnosis of  By: Hassell Done FNP, [place]kedtra     Pressure ulcer 09/10/2019   Right arm weakness 08/08/2019   Sprain of left ankle    STEMI (ST elevation myocardial infarction) ([place]) 10/18/2019   STEMI involving right coronary artery ([place]) 10/18/2019   Stroke ([place]) 10/2019   SVD (spontaneous vaginal delivery)    x 4   Vaginosis 08/26/2018   Weakness 09/10/2019   Weakness of both lower extremities     Past Surgical History:  Procedure Laterality Date   A/V FISTULAGRAM Left 01/12/2020   Procedure: A/V FISTULAGRAM;  Surgeon: Serafina Mitchell, MD;  Location: [place] [facility];  Service: Cardiovascular;  Laterality: Left;   A/V FISTULAGRAM N/A 03/17/2020   Procedure: A/V FISTULAGRAM - Left Arm;  Surgeon: Marty Heck, MD;  Location: [place] [facility];  Service: Cardiovascular;  Laterality: N/A;  A/V FISTULAGRAM Left 06/23/2020   Procedure: A/V FISTULAGRAM;  Surgeon: Marty Heck, MD;  Location: [place] [facility];  Service: Cardiovascular;  Laterality: Left;   ANKLE FUSION Left 06/01/2020   Procedure: LEFT ANKLE FUSION;  Surgeon: Newt Minion, MD;  Location: [place];  Service: Orthopedics;  Laterality: Left;   AV FISTULA PLACEMENT Left 08/17/2019   Procedure: LEFT BRACHIAL CEPHALIC ARTERIOVENOUS (AV) FISTULA;  Surgeon: Angelia Mould, MD;  Location: [place];  Service: Vascular;  Laterality: Left;   BIOPSY  02/28/2021   Procedure: BIOPSY;  Surgeon: Harvel Quale, MD;  [facility];  Service: Gastroenterology;;  duodenum gastric esophagus colon   COLONOSCOPY  WITH PROPOFOL N/A 02/28/2021   Procedure: COLONOSCOPY WITH PROPOFOL;  Surgeon: Harvel Quale, MD;  [facility];  Service: Gastroenterology;  Laterality: N/A;  11:00   CORONARY STENT INTERVENTION N/A 10/18/2019   Procedure: CORONARY STENT INTERVENTION;  Surgeon: Sherren Mocha, MD;  Location: [place] [facility];  Service: Cardiovascular;  Laterality: N/A;   CORONARY/GRAFT ACUTE MI REVASCULARIZATION N/A 10/18/2019   Procedure: Coronary/Graft Acute MI Revascularization;  Surgeon: Sherren Mocha, MD;  Location: [place] [facility];  Service: Cardiovascular;  Laterality: N/A;   ESOPHAGOGASTRODUODENOSCOPY (EGD) WITH PROPOFOL N/A 02/28/2021   Procedure: ESOPHAGOGASTRODUODENOSCOPY (EGD) WITH PROPOFOL;  Surgeon: Harvel Quale, MD;  [facility];  Service: Gastroenterology;  Laterality: N/A;   EYE SURGERY Bilateral    cataracts removed   FEMUR IM NAIL Left 10/28/2018   Procedure: RETROGRADE FEMORAL NAILING;  Surgeon: Meredith Pel, MD;  Location: [place];  Service: Orthopedics;  Laterality: Left;   IM NAILING FEMORAL SHAFT RETROGRADE Left 10/28/2018   INTRAVASCULAR ULTRASOUND/IVUS N/A 10/18/2019   Procedure: Intravascular Ultrasound/IVUS;  Surgeon: Sherren Mocha, MD;  Location: [place] [facility];  Service: Cardiovascular;  Laterality: N/A;   IR FLUORO GUIDE CV LINE RIGHT  08/11/2019   IR THORACENTESIS ASP PLEURAL SPACE W/IMG GUIDE  11/12/2019   IR US GUIDE VASC ACCESS RIGHT  08/11/2019   KNEE SURGERY Left    LEFT HEART CATH AND CORONARY ANGIOGRAPHY N/A 10/18/2019   Procedure: LEFT HEART CATH AND CORONARY ANGIOGRAPHY;  Surgeon: Sherren Mocha, MD;  Location: [place] [facility];  Service: Cardiovascular;  Laterality: N/A;   PERIPHERAL VASCULAR BALLOON ANGIOPLASTY Left 01/12/2020   Procedure: PERIPHERAL VASCULAR BALLOON ANGIOPLASTY;  Surgeon: Serafina Mitchell, MD;  Location: [place] [facility];  Service: Cardiovascular;  Laterality: Left;  AVF   PERIPHERAL VASCULAR  BALLOON ANGIOPLASTY Left 03/17/2020   Procedure: PERIPHERAL VASCULAR BALLOON ANGIOPLASTY;  Surgeon: Marty Heck, MD;  Location: [place] [facility];  Service: Cardiovascular;  Laterality: Left;  AVF   PERIPHERAL VASCULAR BALLOON ANGIOPLASTY Left 05/05/2020   Procedure: PERIPHERAL VASCULAR BALLOON ANGIOPLASTY;  Surgeon: Marty Heck, MD;  Location: [place] [facility];  Service: Cardiovascular;  Laterality: Left;  arm fistula   RADIOLOGY WITH ANESTHESIA N/A 09/15/2019   Procedure: [facility] AND LUMBER LOWER BACK PAIN;  Surgeon: Radiologist, Medication, MD;  Location: [place];  Service: Radiology;  Laterality: N/A;   TUBAL LIGATION      There were no vitals filed for this visit.   Subjective Assessment - 08/23/21 0852     Subjective Patient reports that she has had no falls at home, but her Lt knee has been giver her issues with pain.    Currently in Pain? Yes    Pain Score 10-Worst pain ever    Pain Location Knee  Pain Orientation Left                [facility] PT Assessment - 08/23/21 0001       Special Tests    Special Tests Knee Special Tests    Knee Special tests  other;other2      other    Findings Positive    Side  Left    Comments Varus at 30 degrees flexion      other   findings Negative    Side --   Bilateral   Comments Varus at 30 degrees flexion                           [facility] Adult PT Treatment/Exercise - 08/23/21 0001       Neuro Re-ed    Neuro Re-ed Details  Blue foam walking 2x2RT      Knee/Hip Exercises: Aerobic   Nustep x32min(60-70spm)      Knee/Hip Exercises: Standing   Other Standing Knee Exercises Hamstring curls 3x6 each      Knee/Hip Exercises: Supine   Bridges Other (comment)   2x8                      PT Short Term Goals - 08/09/21 0936       PT SHORT TERM GOAL #1   Title Patient will be independent with initial HEP and self-management strategies to improve functional outcomes    Time 3     Period Weeks    Status New    Target Date 08/30/21               PT Long Term Goals - 08/09/21 0936       PT LONG TERM GOAL #1   Title Patient will be independent with advanced HEP and self-management strategies to improve functional outcomes    Time 6    Period Weeks    Status New    Target Date 09/20/21      PT LONG TERM GOAL #2   Title Patient will be able to ambulate at least 250 feet during 2MWT with LRAD to demonstrate improved ability to perform functional mobility and associated tasks.    Time 6    Period Weeks    Status New    Target Date 09/20/21      PT LONG TERM GOAL #3   Title Patient will have equal to or > 4+/5 MMT throughout BLE (except RT DF due to hx of TIA)  to improve ability to perform functional mobility, stair ambulation and ADLs.    Time 6    Period Weeks    Status New    Target Date 09/20/21      PT LONG TERM GOAL #4   Title Patient will be able to perform stand x 5 in < 15 seconds to demonstrate improvement in functional mobility and reduced risk for falls.    Time 6    Period Weeks    Status New    Target Date 09/20/21                   Plan - 08/23/21 0848     Clinical Impression Statement Patient experienced elevated but "tolerable" pain levels during the whole session. Foam walking was challenging to the patient and she did require nearly constant use of the UEs in order to stabilize. There was an event of knee varus on the Lt side  following too wide of a step on the blue foam. This had reportedly been happening for the past ~year occording to the patient. Knee varus and valgus in 30 degrees of flexion was tested bileterally in a seated position. Valgus was found to be slightly increased bilaterally, but there was a significant degree of knee varus available on the Lt side compared to a clearly intact Rt side. As the patient has stated her Lt knee giving out is the most common reason for her falls at home, I would recommend further  imaging in order to determine the viability of the Lt knee LCL.    Personal Factors and Comorbidities Time since onset of injury/illness/exacerbation;Past/Current Experience;Fitness    Examination-Activity Limitations Locomotion Level;Transfers;Stand;Stairs    Examination-Participation Restrictions Cleaning;School;Community Activity    Stability/Clinical Decision Making Stable/Uncomplicated    Rehab Potential Fair    PT Frequency 2x / week    PT Duration 6 weeks    PT Treatment/Interventions ADLs/Self Care Home Management;Ultrasound;Parrafin;Neuromuscular re-education;Compression bandaging;Visual/perceptual remediation/compensation;Scar mobilization;Fluidtherapy;Contrast Bath;Aquatic Therapy;Biofeedback;DME Instruction;Gait training;Patient/family education;Orthotic Fit/Training;Passive range of motion;Dry needling;Energy conservation;Manual techniques;Vasopneumatic Device;Taping;Splinting;Joint Manipulations;Spinal Manipulations;Cryotherapy;Stair training;Functional mobility training;Electrical Stimulation;Iontophoresis 4mg /ml Dexamethasone;Therapeutic activities;Therapeutic exercise;Moist Heat;Traction;Balance training;Manual lymph drainage    PT Next Visit Plan Protect the Lt knee from potential varus forces during weight bearing activities. Progress LE strengthening balance and gait as tolerated.  F/U with MD appt for LE edema and pain.  Complete insurance assessment next session.    PT Home Exercise Plan Eval: seated march, LAQ    Consulted and Agree with Plan of Care Patient             Patient will benefit from skilled therapeutic intervention in order to improve the following deficits and impairments:  Abnormal gait, Decreased endurance, Increased edema, Decreased activity tolerance, Decreased strength, Pain, Decreased balance, Decreased mobility, Difficulty walking, Postural dysfunction, Improper body mechanics, Decreased range of motion  Visit Diagnosis: Muscle weakness  (generalized)  Other abnormalities of gait and mobility  Pain in thoracic spine  Difficulty in walking, not elsewhere classified  Repeated falls  Chronic midline low back pain without sciatica     Problem List Patient Active Problem List   Diagnosis Date Noted   CAD (coronary artery disease) 02/17/2021   Ischemic cardiomyopathy 02/17/2021   Pain and tenderness 11/10/2020   Encounter for support and coordination of transition of care 06/16/2020   S/P ankle fusion 06/01/2020   Pilon fracture of left tibia, closed, initial encounter    Displaced trimalleolar fracture of left lower leg, initial encounter for closed fracture    Abnormality of gait and mobility 05/26/2020   Migraine 05/26/2020   Diabetic retinopathy ([place]) 05/04/2020   Chronic diarrhea of unknown origin 01/14/2020   Type 2 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, with long-term current use of insulin ([place]) 12/29/2019   Hypothyroidism 12/29/2019   Weakness of both lower extremities 11/12/2019   Debility 09/10/2019   ESRD (end stage renal disease) on dialysis ([place]) 09/10/2019   Depression 09/10/2019   Incontinence in female 09/10/2019   Anemia in chronic kidney disease 08/19/2019   MGUS (monoclonal gammopathy of unknown significance) 07/23/2019   GERD (gastroesophageal reflux disease) 02/28/2019   Essential hypertension 10/28/2018   Irregular menses 09/14/2009   Hypercholesterolemia 11/19/2008   Type 2 diabetes with nephropathy ([place]) 07/02/2007    Adalberto Cole, PT 08/23/2021, 9:45 AM  [place] [address] [place], [place], [address] Phone: [phone]   Fax:  [phone]  Name:  Melanie Hall MRN: [id number] Date of Birth: 05-13-1971

## 2021-09-04 ENCOUNTER — Ambulatory Visit: Payer: Medicaid Other | Admitting: Physician Assistant

## 2021-09-04 DIAGNOSIS — N186 End stage renal disease: Secondary | ICD-10-CM

## 2021-09-04 DIAGNOSIS — I2510 Atherosclerotic heart disease of native coronary artery without angina pectoris: Secondary | ICD-10-CM

## 2021-09-04 DIAGNOSIS — I255 Ischemic cardiomyopathy: Secondary | ICD-10-CM

## 2021-09-04 DIAGNOSIS — E782 Mixed hyperlipidemia: Secondary | ICD-10-CM

## 2021-09-04 DIAGNOSIS — I10 Essential (primary) hypertension: Secondary | ICD-10-CM

## 2021-09-06 ENCOUNTER — Ambulatory Visit (HOSPITAL_COMMUNITY): Payer: Medicaid Other | Admitting: Physical Therapy

## 2021-09-08 ENCOUNTER — Ambulatory Visit (HOSPITAL_COMMUNITY): Payer: Medicaid Other | Attending: Orthopedic Surgery

## 2021-09-08 ENCOUNTER — Other Ambulatory Visit: Payer: Self-pay

## 2021-09-08 DIAGNOSIS — M546 Pain in thoracic spine: Secondary | ICD-10-CM | POA: Diagnosis present

## 2021-09-08 DIAGNOSIS — M6281 Muscle weakness (generalized): Secondary | ICD-10-CM | POA: Diagnosis present

## 2021-09-08 DIAGNOSIS — R262 Difficulty in walking, not elsewhere classified: Secondary | ICD-10-CM | POA: Insufficient documentation

## 2021-09-08 DIAGNOSIS — G8929 Other chronic pain: Secondary | ICD-10-CM | POA: Insufficient documentation

## 2021-09-08 DIAGNOSIS — M545 Low back pain, unspecified: Secondary | ICD-10-CM | POA: Insufficient documentation

## 2021-09-08 DIAGNOSIS — R2689 Other abnormalities of gait and mobility: Secondary | ICD-10-CM | POA: Diagnosis present

## 2021-09-08 DIAGNOSIS — R296 Repeated falls: Secondary | ICD-10-CM | POA: Insufficient documentation

## 2021-09-08 NOTE — Therapy (Signed)
[facility] [address] [place], [place], [address] Phone: [phone]   Fax:  [phone]  Physical Therapy Treatment  Patient Details  Name: Melanie Hall MRN: [id number] Date of Birth: 02-Jan-1971 Referring Provider (PT): Aldean Baker MD   Progress Note Reporting Period 07/27/21 to 09/08/21  See note below for Objective Data and Assessment of Progress/Goals.           Encounter Date: 09/08/2021   PT End of Session - 09/08/21 1155     Visit Number 5    Number of Visits 12    Date for PT Re-Evaluation 09/08/21    Authorization Type Medicaid re-eval done today    PT Start Time 1119    PT Stop Time 1155    PT Time Calculation (min) 36 min    Activity Tolerance Patient tolerated treatment well;Patient limited by fatigue    Behavior During Therapy [facility] for tasks assessed/performed             Past Medical History:  Diagnosis Date   Acute cystitis without hematuria    Acute midline thoracic back pain 12/29/2019   Acute systolic heart failure (HCC) 10/21/2019   Anemia    Arthritis    CAD (coronary artery disease)    S/p late presenting Inf STEMI 10/2019 >> PCI: DES x 2 to RCA // Myoview 8/22: EF 54, no ischemia or infarction; low risk   Cataracts, bilateral    surgery to remove   CATARACTS, BILATERAL 07/02/2007   Qualifier: Diagnosis of  By: Levon Hedger     Closed fracture of left femur (HCC) 10/28/2018   Closed fracture of right ankle 11/06/2017   Diabetes mellitus    type 2   Emphysematous cystitis 08/26/2018   Encounter for gynecological examination with Papanicolaou smear of cervix 01/14/2020   Encounter for screening fecal occult blood testing 01/14/2020   Encounter for screening for malignant neoplasm of cervix 12/29/2019   Encounter for screening mammogram for malignant neoplasm of breast 12/29/2019   ESRD on hemodialysis (HCC)    MWF - in [place]   GERD (gastroesophageal reflux disease)     Hyperlipidemia    Hypertension    Hypokalemia 08/26/2018   IRREGULAR MENSES 09/14/2009   Qualifier: Diagnosis of  By: Daphine Deutscher FNP, Nykedtra     Ischemic cardiomyopathy    Echocardiogram 10/19/2019: EF 40-45, inf WMA, Gr 1 DD, Lg L pl Eff // EF 54 by Myoview in 8/22   Loose stools 11/12/2019   Normocytic anemia 08/26/2018   PARONYCHIA, RIGHT GREAT TOE 07/30/2008   Qualifier: Diagnosis of  By: Daphine Deutscher FNP, Nykedtra     Pressure ulcer 09/10/2019   Right arm weakness 08/08/2019   Sprain of left ankle    STEMI (ST elevation myocardial infarction) (HCC) 10/18/2019   STEMI involving right coronary artery (HCC) 10/18/2019   Stroke (HCC) 10/2019   SVD (spontaneous vaginal delivery)    x 4   Vaginosis 08/26/2018   Weakness 09/10/2019   Weakness of both lower extremities     Past Surgical History:  Procedure Laterality Date   A/V FISTULAGRAM Left 01/12/2020   Procedure: A/V FISTULAGRAM;  Surgeon: Nada Libman, MD;  [facility]: [facility];  Service: Cardiovascular;  Laterality: Left;   A/V FISTULAGRAM N/A 03/17/2020   Procedure: A/V FISTULAGRAM - Left Arm;  Surgeon: Cephus Shelling, MD;  Location: [facility];  Service: Cardiovascular;  Laterality: N/A;   A/V FISTULAGRAM Left 06/23/2020  Procedure: A/V FISTULAGRAM;  Surgeon: Cephus Shelling, MD;  Location: [facility];  Service: Cardiovascular;  Laterality: Left;   ANKLE FUSION Left 06/01/2020   Procedure: LEFT ANKLE FUSION;  Surgeon: Nadara Mustard, MD;  Location[facility] OR;  Service: Orthopedics;  Laterality: Left;   AV FISTULA PLACEMENT Left 08/17/2019   Procedure: LEFT BRACHIAL CEPHALIC ARTERIOVENOUS (AV) FISTULA;  Surgeon: Chuck Hint, MD;  Location: [facility] OR;  Service: Vascular;  Laterality: Left;   BIOPSY  02/28/2021   Procedure: BIOPSY;  Surgeon: Dolores Frame, MD;  [facility]: [facility];  Service: Gastroenterology;;  duodenum gastric esophagus colon   COLONOSCOPY WITH PROPOFOL N/A  02/28/2021   Procedure: COLONOSCOPY WITH PROPOFOL;  Surgeon: Dolores Frame, MD;  [facility]: [facility];  Service: Gastroenterology;  Laterality: N/A;  11:00   CORONARY STENT INTERVENTION N/A 10/18/2019   Procedure: CORONARY STENT INTERVENTION;  Surgeon: Tonny Bollman, MD;  Location: [facility] INVASIVE CV LAB;  Service: Cardiovascular;  Laterality: N/A;   CORONARY/GRAFT ACUTE MI REVASCULARIZATION N/A 10/18/2019   Procedure: Coronary/Graft Acute MI Revascularization;  Surgeon: Tonny Bollman, MD;  Location: [facility] INVASIVE CV LAB;  Service: Cardiovascular;  Laterality: N/A;   ESOPHAGOGASTRODUODENOSCOPY (EGD) WITH PROPOFOL N/A 02/28/2021   Procedure: ESOPHAGOGASTRODUODENOSCOPY (EGD) WITH PROPOFOL;  Surgeon: Dolores Frame, MD;  [facility];  Service: Gastroenterology;  Laterality: N/A;   EYE SURGERY Bilateral    cataracts removed   FEMUR IM NAIL Left 10/28/2018   Procedure: RETROGRADE FEMORAL NAILING;  Surgeon: Cammy Copa, MD;  Location: [facility] OR;  Service: Orthopedics;  Laterality: Left;   IM NAILING FEMORAL SHAFT RETROGRADE Left 10/28/2018   INTRAVASCULAR ULTRASOUND/IVUS N/A 10/18/2019   Procedure: Intravascular Ultrasound/IVUS;  Surgeon: Tonny Bollman, MD;  Location: [facility] INVASIVE CV LAB;  Service: Cardiovascular;  Laterality: N/A;   IR FLUORO GUIDE CV LINE RIGHT  08/11/2019   IR THORACENTESIS ASP PLEURAL SPACE W/IMG GUIDE  11/12/2019   IR US GUIDE VASC ACCESS RIGHT  08/11/2019   KNEE SURGERY Left    LEFT HEART CATH AND CORONARY ANGIOGRAPHY N/A 10/18/2019   Procedure: LEFT HEART CATH AND CORONARY ANGIOGRAPHY;  Surgeon: Tonny Bollman, MD;  Location: [facility] INVASIVE CV LAB;  Service: Cardiovascular;  Laterality: N/A;   PERIPHERAL VASCULAR BALLOON ANGIOPLASTY Left 01/12/2020   Procedure: PERIPHERAL VASCULAR BALLOON ANGIOPLASTY;  Surgeon: Nada Libman, MD;  [facility];  Service: Cardiovascular;  Laterality: Left;  AVF   PERIPHERAL VASCULAR BALLOON  ANGIOPLASTY Left 03/17/2020   Procedure: PERIPHERAL VASCULAR BALLOON ANGIOPLASTY;  Surgeon: Cephus Shelling, MD;  [facility]: [facility];  Service: Cardiovascular;  Laterality: Left;  AVF   PERIPHERAL VASCULAR BALLOON ANGIOPLASTY Left 05/05/2020   Procedure: PERIPHERAL VASCULAR BALLOON ANGIOPLASTY;  Surgeon: Cephus Shelling, MD;  [facility]: [facility];  Service: Cardiovascular;  Laterality: Left;  arm fistula   RADIOLOGY WITH ANESTHESIA N/A 09/15/2019   Procedure: [facility] AND LUMBER LOWER BACK PAIN;  Surgeon: Radiologist, Medication, MD;  Location: [facility] OR;  Service: Radiology;  Laterality: N/A;   TUBAL LIGATION      There were no vitals filed for this visit.   Subjective Assessment - 09/08/21 1150     Subjective Patient states compliance with HEP    Limitations Standing;Walking;House hold activities    Currently in Pain? Yes    Pain Score 8     Pain Location Leg    Pain Orientation Left    Pain Descriptors / Indicators Tightness;Sore  Pain Type Chronic pain    Pain Onset More than a month ago    Pain Frequency Constant    Aggravating Factors  walking, standing    Pain Relieving Factors rest, medications    Effect of Pain on Daily Activities limits activity tolerance                [facility] PT Assessment - 09/08/21 0001       Assessment   Medical Diagnosis BLE weakness    Referring Provider (PT) Aldean Baker MD    Hand Dominance Right    Next MD Visit 09/11/21   Lajoyce Corners and PCP   Prior Therapy Yes      Precautions   Precautions Fall    Required Braces or Orthoses Other Brace/Splint   bilat AFO's     Restrictions   Weight Bearing Restrictions No      Strength   Right Hip Flexion 4/5    Left Hip Flexion 4/5    Right Knee Extension 4+/5    Left Knee Extension 4/5    Right Ankle Dorsiflexion 2+/5    Left Ankle Dorsiflexion 3-/5      Transfers   Five time sit to stand comments  17 with use of UE      Ambulation/Gait   Ambulation/Gait Yes     Ambulation/Gait Assistance 6: Modified independent (Device/Increase time)    Ambulation Distance (Feet) 115 Feet     Assistive device 4-wheeled walker      Functional Gait  Assessment   Gait assessed  Yes                           [facility] Adult PT Treatment/Exercise - 09/08/21 0001       Knee/Hip Exercises: Aerobic   Nustep x3min  seat 6, arms 7                     PT Education - 09/08/21 1155     Education Details review of HEP, importance of keeping MD appts, dialysis    Person(s) Educated Patient    Methods Explanation    Comprehension Verbalized understanding              PT Short Term Goals - 09/08/21 1200       PT SHORT TERM GOAL #1   Title Patient will be independent with initial HEP and self-management strategies to improve functional outcomes    Baseline reports adherence to HEP    Time 3    Period Weeks    Status On-going    Target Date 09/29/21      PT SHORT TERM GOAL #2   Title Patient will be able to ambulate at least 175 feet during with LRAD to demonstrate improved ability to perform functional mobility and associated tasks.    Baseline current 115 ft today with rollator    Status On-going               PT Long Term Goals - 09/08/21 1210       PT LONG TERM GOAL #1   Title Patient will be independent with advanced HEP and self-management strategies to improve functional outcomes    Time 6    Period Weeks    Status On-going    Target Date 10/20/21      PT LONG TERM GOAL #2   Title Patient will be able to ambulate at least 250 feet during  with LRAD to demonstrate improved ability to perform functional mobility and associated tasks.    Baseline 115 ft today with rollator    Time 6    Period Weeks    Status On-going    Target Date 10/20/21      PT LONG TERM GOAL #3   Title Patient will have equal to or > 4+/5 MMT throughout BLE (except RT DF due to hx of TIA)  to improve ability to perform  functional mobility, stair ambulation and ADLs.    Baseline noted improvement hip F and knee ext bilat    Time 6    Period Weeks    Status On-going    Target Date 10/20/21      PT LONG TERM GOAL #4   Title Patient will be able to perform stand x 5 in < 15 seconds to demonstrate improvement in functional mobility and reduced risk for falls.    Baseline today 17 sec improved from 23 sec at eval    Time 6    Period Weeks    Status On-going    Target Date 10/20/21                   Plan - 09/08/21 1156     Clinical Impression Statement Patient states continued chronic pain.  Re-eval visit today to request more authrorization.  Patient performed 5 x STS test in 17 sec (improved from 23) and had some improvement in her MMTs as well. she demonstrates overall progress towards her goals and will benefit from further skilled PT interventions to address continued LE weakness, chronic pain, and decreased functional mobility.    Personal Factors and Comorbidities Time since onset of injury/illness/exacerbation;Past/Current Experience;Fitness    Examination-Activity Limitations Locomotion Level;Transfers;Stand;Stairs    Examination-Participation Restrictions Cleaning;School;Community Activity    Stability/Clinical Decision Making Stable/Uncomplicated    Rehab Potential Fair    PT Frequency 2x / week    PT Duration 6 weeks    PT Treatment/Interventions ADLs/Self Care Home Management;Ultrasound;Parrafin;Neuromuscular re-education;Compression bandaging;Visual/perceptual remediation/compensation;Scar mobilization;Fluidtherapy;Contrast Bath;Aquatic Therapy;Biofeedback;DME Instruction;Gait training;Patient/family education;Orthotic Fit/Training;Passive range of motion;Dry needling;Energy conservation;Manual techniques;Vasopneumatic Device;Taping;Splinting;Joint Manipulations;Spinal Manipulations;Cryotherapy;Stair training;Functional mobility training;Electrical Stimulation;Iontophoresis 4mg /ml  Dexamethasone;Therapeutic activities;Therapeutic exercise;Moist Heat;Traction;Balance training;Manual lymph drainage    PT Next Visit Plan resume all exercise; continue to progress LE and core strengthening, balance, gait training as able    PT Home Exercise Plan -Eval: seated march, LAQ    Consulted and Agree with Plan of Care Patient             Patient will benefit from skilled therapeutic intervention in order to improve the following deficits and impairments:  Abnormal gait, Decreased endurance, Increased edema, Decreased activity tolerance, Decreased strength, Pain, Decreased balance, Decreased mobility, Difficulty walking, Postural dysfunction, Improper body mechanics, Decreased range of motion  Visit Diagnosis: Muscle weakness (generalized) - Plan: PT plan of care cert/re-cert  Other abnormalities of gait and mobility - Plan: PT plan of care cert/re-cert  Difficulty in walking, not elsewhere classified - Plan: PT plan of care cert/re-cert     Problem List Patient Active Problem List   Diagnosis Date Noted   CAD (coronary artery disease) 02/17/2021   Ischemic cardiomyopathy 02/17/2021   Pain and tenderness 11/10/2020   Encounter for support and coordination of transition of care 06/16/2020   S/P ankle fusion 06/01/2020   Pilon fracture of left tibia, closed, initial encounter    Displaced trimalleolar fracture of left lower leg, initial encounter for closed fracture    Abnormality  of gait and mobility 05/26/2020   Migraine 05/26/2020   Diabetic retinopathy (HCC) 05/04/2020   Chronic diarrhea of unknown origin 01/14/2020   Type 2 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, with long-term current use of insulin (HCC) 12/29/2019   Hypothyroidism 12/29/2019   Weakness of both lower extremities 11/12/2019   Debility 09/10/2019   ESRD (end stage renal disease) on dialysis (HCC) 09/10/2019   Depression 09/10/2019   Incontinence in female 09/10/2019    Anemia in chronic kidney disease 08/19/2019   MGUS (monoclonal gammopathy of unknown significance) 07/23/2019   GERD (gastroesophageal reflux disease) 02/28/2019   Essential hypertension 10/28/2018   Irregular menses 09/14/2009   Hypercholesterolemia 11/19/2008   Type 2 diabetes with nephropathy (HCC) 07/02/2007    12:26 PM, 09/08/21 Maykel Reitter [facility] physical [facility] [place] [phone]   [facility] [address] [place], [place], [address] Phone: [phone]   Fax:  [phone]  Name: LORESA HAAPALA MRN: [id number] Date of Birth: 10-12-1970

## 2021-09-10 ENCOUNTER — Other Ambulatory Visit: Payer: Self-pay | Admitting: Internal Medicine

## 2021-09-10 DIAGNOSIS — E039 Hypothyroidism, unspecified: Secondary | ICD-10-CM

## 2021-09-11 ENCOUNTER — Ambulatory Visit (INDEPENDENT_AMBULATORY_CARE_PROVIDER_SITE_OTHER): Payer: Medicaid Other | Admitting: Orthopedic Surgery

## 2021-09-11 ENCOUNTER — Encounter: Payer: Self-pay | Admitting: Orthopedic Surgery

## 2021-09-11 ENCOUNTER — Ambulatory Visit: Payer: Medicaid Other | Admitting: Nurse Practitioner

## 2021-09-11 DIAGNOSIS — Z992 Dependence on renal dialysis: Secondary | ICD-10-CM | POA: Diagnosis not present

## 2021-09-11 DIAGNOSIS — E1121 Type 2 diabetes mellitus with diabetic nephropathy: Secondary | ICD-10-CM | POA: Diagnosis not present

## 2021-09-11 DIAGNOSIS — N186 End stage renal disease: Secondary | ICD-10-CM | POA: Diagnosis not present

## 2021-09-11 DIAGNOSIS — Z981 Arthrodesis status: Secondary | ICD-10-CM | POA: Diagnosis not present

## 2021-09-11 NOTE — Progress Notes (Signed)
Office Visit Note   Patient: Melanie Hall           Date of Birth: 17-Dec-1970           MRN: [id number] Visit Date: 09/11/2021              Requested by: No referring provider defined for this encounter. PCP: Pcp, No  Chief Complaint  Patient presents with   Left Ankle - Follow-up    06/01/20 left ankle fusion       HPI: Patient is a 51 year old woman who is seen in follow-up status post ankle fusion.  She is using double upright braces bilaterally.  Patient's feels like she is doing much better she is going to therapy and she states the therapy is helping as well.  Currently therapy in [place].  Assessment & Plan: Visit Diagnoses:  1. Type 2 diabetes with nephropathy ([place])   2. ESRD (end stage renal disease) on dialysis ([place])   3. H/O ankle fusion     Plan: Patient will continue with her therapy continue with the double upright braces follow-up as needed.  Follow-Up Instructions: Return if symptoms worsen or fail to improve.   Ortho Exam  Patient is alert, oriented, no adenopathy, well-dressed, normal affect, normal respiratory effort. Examination patient's foot is plantigrade she does have some mild venous swelling there is no redness cellulitis or drainage.  Her foot is plantigrade.  Imaging: No results found. No images are attached to the encounter.  Labs: Lab Results  Component Value Date   HGBA1C 8.7 (H) 02/14/2021   HGBA1C 8.7 (H) 06/02/2020   HGBA1C 7.6 (H) 10/18/2019   ESRSEDRATE 45 (H) 09/10/2019   CRP 5.0 (H) 09/10/2019   LABURIC 5.7 05/27/2019   REPTSTATUS 11/13/2019 FINAL 11/12/2019   REPTSTATUS 11/17/2019 FINAL 11/12/2019   GRAMSTAIN  11/12/2019    WBC PRESENT,BOTH PMN AND MONONUCLEAR NO ORGANISMS SEEN CYTOSPIN SMEAR Performed at [facility], [place] [address] [place], [place] [address]    CULT  11/12/2019    NO GROWTH 5 DAYS Performed at [facility] [address], [place], [place] [address]    LABORGA ESCHERICHIA COLI  (A) 08/26/2018     Lab Results  Component Value Date   ALBUMIN 3.8 02/16/2021   ALBUMIN 4.5 02/14/2021   ALBUMIN 3.8 11/10/2020    Lab Results  Component Value Date   MG 1.8 08/24/2019   MG 1.7 08/23/2019   MG 1.7 08/09/2019   Lab Results  Component Value Date   VD25OH 8.82 (L) 05/27/2019    No results found for: PREALBUMIN CBC EXTENDED Latest Ref Rng & Units 02/28/2021 02/14/2021 11/10/2020  WBC 3.4 - 10.8 x10E3/uL - 7.4 8.6  RBC 3.77 - 5.28 x10E6/uL - 3.34(L) 3.26(L)  HGB 12.0 - 15.0 g/dL 10.9(L) 11.5 10.4(L)  HCT 36.0 - 46.0 % 32.0(L) 35.4 32.6(L)  PLT 150 - 450 x10E3/uL - 195 212  NEUTROABS 1.4 - 7.0 x10E3/uL - 4.8 5.1  LYMPHSABS 0.7 - 3.1 x10E3/uL - 1.5 1.9     There is no height or weight on file to calculate BMI.  Orders:  No orders of the defined types were placed in this encounter.  No orders of the defined types were placed in this encounter.    Procedures: No procedures performed  Clinical Data: No additional findings.  ROS:  All other systems negative, except as noted in the HPI. Review of Systems  Objective: Vital Signs: LMP 12/17/2016   Specialty  Comments:  No specialty comments available.  PMFS History: Patient Active Problem List   Diagnosis Date Noted   CAD (coronary artery disease) 02/17/2021   Ischemic cardiomyopathy 02/17/2021   Pain and tenderness 11/10/2020   Encounter for support and coordination of transition of care 06/16/2020   S/P ankle fusion 06/01/2020   Pilon fracture of left tibia, closed, initial encounter    Displaced trimalleolar fracture of left lower leg, initial encounter for closed fracture    Abnormality of gait and mobility 05/26/2020   Migraine 05/26/2020   Diabetic retinopathy ([place]) 05/04/2020   Chronic diarrhea of unknown origin 01/14/2020   Type 2 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, with long-term current use of insulin ([place]) 12/29/2019   Hypothyroidism 12/29/2019    Weakness of both lower extremities 11/12/2019   Debility 09/10/2019   ESRD (end stage renal disease) on dialysis ([place]) 09/10/2019   Depression 09/10/2019   Incontinence in female 09/10/2019   Anemia in chronic kidney disease 08/19/2019   MGUS (monoclonal gammopathy of unknown significance) 07/23/2019   GERD (gastroesophageal reflux disease) 02/28/2019   Essential hypertension 10/28/2018   Irregular menses 09/14/2009   Hypercholesterolemia 11/19/2008   Type 2 diabetes with nephropathy ([place]) 07/02/2007   Past Medical History:  Diagnosis Date   Acute cystitis without hematuria    Acute midline thoracic back pain 40/34/7425   Acute systolic heart failure ([place]) 10/21/2019   Anemia    Arthritis    CAD (coronary artery disease)    S/p late presenting Inf STEMI 10/2019 >> PCI: DES x 2 to RCA // Myoview 8/22: EF 54, no ischemia or infarction; low risk   Cataracts, bilateral    surgery to remove   CATARACTS, BILATERAL 07/02/2007   Qualifier: Diagnosis of  By: Isla Pence     Closed fracture of left femur ([place]) 10/28/2018   Closed fracture of right ankle 11/06/2017   Diabetes mellitus    type 2   Emphysematous cystitis 08/26/2018   Encounter for gynecological examination with Papanicolaou smear of cervix 01/14/2020   Encounter for screening fecal occult blood testing 01/14/2020   Encounter for screening for malignant neoplasm of cervix 12/29/2019   Encounter for screening mammogram for malignant neoplasm of breast 12/29/2019   ESRD on hemodialysis ([place])    MWF - in [place]   GERD (gastroesophageal reflux disease)    Hyperlipidemia    Hypertension    Hypokalemia 08/26/2018   IRREGULAR MENSES 09/14/2009   Qualifier: Diagnosis of  By: Hassell Done FNP, [place]     Ischemic cardiomyopathy    Echocardiogram 10/19/2019: EF 40-45, inf WMA, Gr 1 DD, Lg L pl Eff // EF 54 by Myoview in 8/22   Loose stools 11/12/2019   Normocytic anemia 08/26/2018   PARONYCHIA, RIGHT GREAT TOE 07/30/2008    Qualifier: Diagnosis of  By: Hassell Done FNP, [place]ked[place]     Pressure ulcer 09/10/2019   Right arm weakness 08/08/2019   Sprain of left ankle    STEMI (ST elevation myocardial infarction) ([place]) 10/18/2019   STEMI involving right coronary artery ([place]) 10/18/2019   Stroke ([place]) 10/2019   SVD (spontaneous vaginal delivery)    x 4   Vaginosis 08/26/2018   Weakness 09/10/2019   Weakness of both lower extremities     Family History  Problem Relation Age of Onset   Diabetes Mother    Hypertension Mother    Bipolar disorder Mother    Brain cancer Maternal Aunt    Heart disease Maternal Grandmother  Diabetes Maternal Grandmother    Breast cancer Maternal Aunt    Depression Father        Committed suicide   Diabetes Sister     Past Surgical History:  Procedure Laterality Date   A/V FISTULAGRAM Left 01/12/2020   Procedure: A/V FISTULAGRAM;  Surgeon: Serafina Mitchell, MD;  Location: [facility];  Service: Cardiovascular;  Laterality: Left;   A/V FISTULAGRAM N/A 03/17/2020   Procedure: A/V FISTULAGRAM - Left Arm;  Surgeon: Marty Heck, MD;  Location: [facility];  Service: Cardiovascular;  Laterality: N/A;   A/V FISTULAGRAM Left 06/23/2020   Procedure: A/V FISTULAGRAM;  Surgeon: Marty Heck, MD;  Location: [facility];  Service: Cardiovascular;  Laterality: Left;   ANKLE FUSION Left 06/01/2020   Procedure: LEFT ANKLE FUSION;  Surgeon: Newt Minion, MD;  Location: [facility];  Service: Orthopedics;  Laterality: Left;   AV FISTULA PLACEMENT Left 08/17/2019   Procedure: LEFT BRACHIAL CEPHALIC ARTERIOVENOUS (AV) FISTULA;  Surgeon: Angelia Mould, MD;  Location: [facility];  Service: Vascular;  Laterality: Left;   BIOPSY  02/28/2021   Procedure: BIOPSY;  Surgeon: Harvel Quale, MD;  [facility];  Service: Gastroenterology;;  duodenum gastric esophagus colon   COLONOSCOPY WITH PROPOFOL N/A 02/28/2021   Procedure: COLONOSCOPY WITH PROPOFOL;   Surgeon: Harvel Quale, MD;  [facility];  Service: Gastroenterology;  Laterality: N/A;  11:00   CORONARY STENT INTERVENTION N/A 10/18/2019   Procedure: CORONARY STENT INTERVENTION;  Surgeon: Sherren Mocha, MD;  Location: [facility];  Service: Cardiovascular;  Laterality: N/A;   CORONARY/GRAFT ACUTE MI REVASCULARIZATION N/A 10/18/2019   Procedure: Coronary/Graft Acute MI Revascularization;  Surgeon: Sherren Mocha, MD;  Location: [facility];  Service: Cardiovascular;  Laterality: N/A;   ESOPHAGOGASTRODUODENOSCOPY (EGD) WITH PROPOFOL N/A 02/28/2021   Procedure: ESOPHAGOGASTRODUODENOSCOPY (EGD) WITH PROPOFOL;  Surgeon: Harvel Quale, MD;  [facility];  Service: Gastroenterology;  Laterality: N/A;   EYE SURGERY Bilateral    cataracts removed   FEMUR IM NAIL Left 10/28/2018   Procedure: RETROGRADE FEMORAL NAILING;  Surgeon: Meredith Pel, MD;  Location: [facility];  Service: Orthopedics;  Laterality: Left;   IM NAILING FEMORAL SHAFT RETROGRADE Left 10/28/2018   INTRAVASCULAR ULTRASOUND/IVUS N/A 10/18/2019   Procedure: Intravascular Ultrasound/IVUS;  Surgeon: Sherren Mocha, MD;  Location: [facility];  Service: Cardiovascular;  Laterality: N/A;   IR FLUORO GUIDE CV LINE RIGHT  08/11/2019   IR THORACENTESIS ASP PLEURAL SPACE W/IMG GUIDE  11/12/2019   IR US GUIDE VASC ACCESS RIGHT  08/11/2019   KNEE SURGERY Left    LEFT HEART CATH AND CORONARY ANGIOGRAPHY N/A 10/18/2019   Procedure: LEFT HEART CATH AND CORONARY ANGIOGRAPHY;  Surgeon: Sherren Mocha, MD;  Location: [facility];  Service: Cardiovascular;  Laterality: N/A;   PERIPHERAL VASCULAR BALLOON ANGIOPLASTY Left 01/12/2020   Procedure: PERIPHERAL VASCULAR BALLOON ANGIOPLASTY;  Surgeon: Serafina Mitchell, MD;  Location: [facility];  Service: Cardiovascular;  Laterality: Left;  AVF   PERIPHERAL VASCULAR BALLOON ANGIOPLASTY Left 03/17/2020   Procedure: PERIPHERAL VASCULAR  BALLOON ANGIOPLASTY;  Surgeon: Marty Heck, MD;  Location: [facility];  Service: Cardiovascular;  Laterality: Left;  AVF   PERIPHERAL VASCULAR BALLOON ANGIOPLASTY Left 05/05/2020   Procedure: PERIPHERAL VASCULAR BALLOON ANGIOPLASTY;  Surgeon: Marty Heck, MD;  Location: [facility];  Service: Cardiovascular;  Laterality: Left;  arm fistula  RADIOLOGY WITH ANESTHESIA N/A 09/15/2019   Procedure: [facility] AND LUMBER LOWER BACK PAIN;  Surgeon: Radiologist, Medication, MD;  Location: [facility];  Service: Radiology;  Laterality: N/A;   TUBAL LIGATION     Social History   Occupational History   Not on file  Tobacco Use   Smoking status: Former    Packs/day: 0.25    Years: 2.00    Pack years: 0.50    Types: Cigarettes    Quit date: 1997    Years since quitting: 26.1   Smokeless tobacco: Never  Vaping Use   Vaping Use: Never used  Substance and Sexual Activity   Alcohol use: No   Drug use: No   Sexual activity: Yes    Birth control/protection: Surgical    Comment: tubal

## 2021-09-13 ENCOUNTER — Ambulatory Visit (HOSPITAL_COMMUNITY): Payer: Medicaid Other

## 2021-09-13 ENCOUNTER — Other Ambulatory Visit: Payer: Self-pay

## 2021-09-13 DIAGNOSIS — R296 Repeated falls: Secondary | ICD-10-CM

## 2021-09-13 DIAGNOSIS — G8929 Other chronic pain: Secondary | ICD-10-CM

## 2021-09-13 DIAGNOSIS — M545 Low back pain, unspecified: Secondary | ICD-10-CM

## 2021-09-13 DIAGNOSIS — M6281 Muscle weakness (generalized): Secondary | ICD-10-CM | POA: Diagnosis not present

## 2021-09-13 DIAGNOSIS — R262 Difficulty in walking, not elsewhere classified: Secondary | ICD-10-CM

## 2021-09-13 DIAGNOSIS — R2689 Other abnormalities of gait and mobility: Secondary | ICD-10-CM

## 2021-09-13 DIAGNOSIS — M546 Pain in thoracic spine: Secondary | ICD-10-CM

## 2021-09-13 NOTE — Therapy (Signed)
[place], [place], [address] Phone: [phone]   Fax:  [phone]  Physical Therapy Treatment  Patient Details  Name: Melanie Hall MRN: [id number] Date of Birth: 1970/07/28 Referring Provider (PT): Meridee Score MD   Encounter Date: 09/13/2021   PT End of Session - 09/13/21 1038     Visit Number 6    Number of Visits 12    Date for PT Re-Evaluation 09/08/21    Authorization Type Medicaid re-eval done 09/08/21    Authorization Time Period 3/8-4/4    Authorization - Visit Number 5    Authorization - Number of Visits 12    Progress Note Due on Visit 12    PT Start Time 1036    PT Stop Time 1120    PT Time Calculation (min) 44 min    Equipment Utilized During Treatment Gait belt    Activity Tolerance Patient tolerated treatment well;Patient limited by fatigue    Behavior During Therapy [facility] for tasks assessed/performed             Past Medical History:  Diagnosis Date   Acute cystitis without hematuria    Acute midline thoracic back pain 00/17/4944   Acute systolic heart failure ([place]) 10/21/2019   Anemia    Arthritis    CAD (coronary artery disease)    S/p late presenting Inf STEMI 10/2019 >> PCI: DES x 2 to RCA // Myoview 8/22: EF 54, no ischemia or infarction; low risk   Cataracts, bilateral    surgery to remove   CATARACTS, BILATERAL 07/02/2007   Qualifier: Diagnosis of  By: Isla Pence     Closed fracture of left femur ([place]) 10/28/2018   Closed fracture of right ankle 11/06/2017   Diabetes mellitus    type 2   Emphysematous cystitis 08/26/2018   Encounter for gynecological examination with Papanicolaou smear of cervix 01/14/2020   Encounter for screening fecal occult blood testing 01/14/2020   Encounter for screening for malignant neoplasm of cervix 12/29/2019   Encounter for screening mammogram for malignant neoplasm of breast 12/29/2019   ESRD on hemodialysis ([place])    MWF - in [place]    GERD (gastroesophageal reflux disease)    Hyperlipidemia    Hypertension    Hypokalemia 08/26/2018   IRREGULAR MENSES 09/14/2009   Qualifier: Diagnosis of  By: Hassell Done FNP, [place]     Ischemic cardiomyopathy    Echocardiogram 10/19/2019: EF 40-45, inf WMA, Gr 1 DD, Lg L pl Eff // EF 54 by Myoview in 8/22   Loose stools 11/12/2019   Normocytic anemia 08/26/2018   PARONYCHIA, RIGHT GREAT TOE 07/30/2008   Qualifier: Diagnosis of  By: Hassell Done FNP, [place]     Pressure ulcer 09/10/2019   Right arm weakness 08/08/2019   Sprain of left ankle    STEMI (ST elevation myocardial infarction) ([place]) 10/18/2019   STEMI involving right coronary artery ([place]) 10/18/2019   Stroke ([place]) 10/2019   SVD (spontaneous vaginal delivery)    x 4   Vaginosis 08/26/2018   Weakness 09/10/2019   Weakness of both lower extremities     Past Surgical History:  Procedure Laterality Date   A/V FISTULAGRAM Left 01/12/2020   Procedure: A/V FISTULAGRAM;  Surgeon: Serafina Mitchell, MD;  Location: [place] [facility];  Service: Cardiovascular;  Laterality: Left;   A/V FISTULAGRAM N/A 03/17/2020   Procedure: A/V FISTULAGRAM - Left Arm;  Surgeon: Marty Heck, MD;  Location: [place] [facility];  Service: Cardiovascular;  Laterality: N/A;   A/V FISTULAGRAM Left 06/23/2020   Procedure: A/V FISTULAGRAM;  Surgeon: Marty Heck, MD;  Location: [place] [facility];  Service: Cardiovascular;  Laterality: Left;   ANKLE FUSION Left 06/01/2020   Procedure: LEFT ANKLE FUSION;  Surgeon: Newt Minion, MD;  Location: [place];  Service: Orthopedics;  Laterality: Left;   AV FISTULA PLACEMENT Left 08/17/2019   Procedure: LEFT BRACHIAL CEPHALIC ARTERIOVENOUS (AV) FISTULA;  Surgeon: Angelia Mould, MD;  Location: [place];  Service: Vascular;  Laterality: Left;   BIOPSY  02/28/2021   Procedure: BIOPSY;  Surgeon: Harvel Quale, MD;  [facility];  Service: Gastroenterology;;   duodenum gastric esophagus colon   COLONOSCOPY WITH PROPOFOL N/A 02/28/2021   Procedure: COLONOSCOPY WITH PROPOFOL;  Surgeon: Harvel Quale, MD;  [facility];  Service: Gastroenterology;  Laterality: N/A;  11:00   CORONARY STENT INTERVENTION N/A 10/18/2019   Procedure: CORONARY STENT INTERVENTION;  Surgeon: Sherren Mocha, MD;  Location: [place] [facility];  Service: Cardiovascular;  Laterality: N/A;   CORONARY/GRAFT ACUTE MI REVASCULARIZATION N/A 10/18/2019   Procedure: Coronary/Graft Acute MI Revascularization;  Surgeon: Sherren Mocha, MD;  Location: [place] [facility];  Service: Cardiovascular;  Laterality: N/A;   ESOPHAGOGASTRODUODENOSCOPY (EGD) WITH PROPOFOL N/A 02/28/2021   Procedure: ESOPHAGOGASTRODUODENOSCOPY (EGD) WITH PROPOFOL;  Surgeon: Harvel Quale, MD;  [facility];  Service: Gastroenterology;  Laterality: N/A;   EYE SURGERY Bilateral    cataracts removed   FEMUR IM NAIL Left 10/28/2018   Procedure: RETROGRADE FEMORAL NAILING;  Surgeon: Meredith Pel, MD;  Location: [place];  Service: Orthopedics;  Laterality: Left;   IM NAILING FEMORAL SHAFT RETROGRADE Left 10/28/2018   INTRAVASCULAR ULTRASOUND/IVUS N/A 10/18/2019   Procedure: Intravascular Ultrasound/IVUS;  Surgeon: Sherren Mocha, MD;  Location: [place] [facility];  Service: Cardiovascular;  Laterality: N/A;   IR FLUORO GUIDE CV LINE RIGHT  08/11/2019   IR THORACENTESIS ASP PLEURAL SPACE W/IMG GUIDE  11/12/2019   IR US GUIDE VASC ACCESS RIGHT  08/11/2019   KNEE SURGERY Left    LEFT HEART CATH AND CORONARY ANGIOGRAPHY N/A 10/18/2019   Procedure: LEFT HEART CATH AND CORONARY ANGIOGRAPHY;  Surgeon: Sherren Mocha, MD;  Location: [place] [facility];  Service: Cardiovascular;  Laterality: N/A;   PERIPHERAL VASCULAR BALLOON ANGIOPLASTY Left 01/12/2020   Procedure: PERIPHERAL VASCULAR BALLOON ANGIOPLASTY;  Surgeon: Serafina Mitchell, MD;  Location: [place] [facility];  Service:  Cardiovascular;  Laterality: Left;  AVF   PERIPHERAL VASCULAR BALLOON ANGIOPLASTY Left 03/17/2020   Procedure: PERIPHERAL VASCULAR BALLOON ANGIOPLASTY;  Surgeon: Marty Heck, MD;  Location: [place] [facility];  Service: Cardiovascular;  Laterality: Left;  AVF   PERIPHERAL VASCULAR BALLOON ANGIOPLASTY Left 05/05/2020   Procedure: PERIPHERAL VASCULAR BALLOON ANGIOPLASTY;  Surgeon: Marty Heck, MD;  Location: [place] [facility];  Service: Cardiovascular;  Laterality: Left;  arm fistula   RADIOLOGY WITH ANESTHESIA N/A 09/15/2019   Procedure: [facility] AND LUMBER LOWER BACK PAIN;  Surgeon: Radiologist, Medication, MD;  Location: [place];  Service: Radiology;  Laterality: N/A;   TUBAL LIGATION      There were no vitals filed for this visit.   Subjective Assessment - 09/13/21 1047     Subjective Patient states compliance with HEP, reports no new falls.  Her pain level today is 8/10 L leg > R    Limitations Standing;Walking;House hold activities    How long can you stand  comfortably? <5 minutes    How long can you walk comfortably? <5 minutes    Patient Stated Goals Improve strength and walk better    Currently in Pain? Yes    Pain Score 8     Pain Location Leg    Pain Orientation Left    Pain Descriptors / Indicators Throbbing;Shooting;Sharp;Constant    Pain Type Chronic pain    Pain Onset More than a month ago    Pain Frequency Constant                               [facility] Adult PT Treatment/Exercise - 09/13/21 0001       Knee/Hip Exercises: Aerobic   Nustep x10 min  seat 6, arms 7      Knee/Hip Exercises: Standing   Forward Step Up 5 sets;Right;Left;Hand Hold: 2;Step Height: 4"    Forward Step Up Limitations CGA    Other Standing Knee Exercises Forward/back walking 10 ft x 2 each      Knee/Hip Exercises: Seated   Long Arc Quad Strengthening;Right;Left;10 reps    Long Arc Quad Weight 2 lbs.    Long [employer] Limitations more difficulty  L > R     Marching 10 reps;Weights;Strengthening    Marching Limitations single leg at a time hip F; less motion L than R    Marching Weights 2 lbs.    Sit to Sand 5 reps;2 sets                     PT Education - 09/13/21 1121     Education Details review of HEP, fall prevention at home; step navigation    Person(s) Educated Patient    Methods Explanation    Comprehension Verbalized understanding              PT Short Term Goals - 09/08/21 1200       PT SHORT TERM GOAL #1   Title Patient will be independent with initial HEP and self-management strategies to improve functional outcomes    Baseline reports adherence to HEP    Time 3    Period Weeks    Status On-going    Target Date 09/29/21      PT SHORT TERM GOAL #2   Title Patient will be able to ambulate at least 175 feet during 2MWT with LRAD to demonstrate improved ability to perform functional mobility and associated tasks.    Baseline current 115 ft today with rollator    Status On-going               PT Long Term Goals - 09/08/21 1210       PT LONG TERM GOAL #1   Title Patient will be independent with advanced HEP and self-management strategies to improve functional outcomes    Time 6    Period Weeks    Status On-going    Target Date 10/20/21      PT LONG TERM GOAL #2   Title Patient will be able to ambulate at least 250 feet during 2MWT with LRAD to demonstrate improved ability to perform functional mobility and associated tasks.    Baseline 115 ft today with rollator    Time 6    Period Weeks    Status On-going    Target Date 10/20/21      PT LONG TERM GOAL #3   Title Patient will have equal to or >  4+/5 MMT throughout BLE (except RT DF due to hx of TIA)  to improve ability to perform functional mobility, stair ambulation and ADLs.    Baseline noted improvement hip F and knee ext bilat    Time 6    Period Weeks    Status On-going    Target Date 10/20/21      PT LONG TERM GOAL #4   Title  Patient will be able to perform stand x 5 in < 15 seconds to demonstrate improvement in functional mobility and reduced risk for falls.    Baseline today 17 sec improved from 23 sec at eval    Time 6    Period Weeks    Status On-going    Target Date 10/20/21                   Plan - 09/13/21 1122     Clinical Impression Statement received approval for 8 more visits.  therapy session today focused on progressing lower extremity strengthening and balance.  Patient progressing well with therapy today; able to tolerate increased time on the Nu step to 10 min, added 2# weights to LAQs and hip F, added 4" steps up as patient states she has difficulty navigating steps into her home.  She does not have rails on her steps and her landlord is not agreeable to adding.  She states she is looking into getting someone to do the railings for her but has not yet been sucessful. Patient will continue to benefit from skilled therapy services to reduce deficits and improve functional level.    Personal Factors and Comorbidities Time since onset of injury/illness/exacerbation;Past/Current Experience;Fitness    Examination-Activity Limitations Locomotion Level;Transfers;Stand;Stairs    Examination-Participation Restrictions Cleaning;School;Community Activity    Stability/Clinical Decision Making Stable/Uncomplicated    Rehab Potential Fair    PT Frequency 2x / week    PT Duration 6 weeks    PT Treatment/Interventions ADLs/Self Care Home Management;Ultrasound;Parrafin;Neuromuscular re-education;Compression bandaging;Visual/perceptual remediation/compensation;Scar mobilization;Fluidtherapy;Contrast Bath;Aquatic Therapy;Biofeedback;DME Instruction;Gait training;Patient/family education;Orthotic Fit/Training;Passive range of motion;Dry needling;Energy conservation;Manual techniques;Vasopneumatic Device;Taping;Splinting;Joint Manipulations;Spinal Manipulations;Cryotherapy;Stair training;Functional mobility  training;Electrical Stimulation;Iontophoresis '4mg'$ /ml Dexamethasone;Therapeutic activities;Therapeutic exercise;Moist Heat;Traction;Balance training;Manual lymph drainage    PT Next Visit Plan continue to progress LE and core strengthening, balance, gait training as able; add sidestepping    PT Home Exercise Plan seated march, LAQ    Consulted and Agree with Plan of Care Patient             Patient will benefit from skilled therapeutic intervention in order to improve the following deficits and impairments:  Abnormal gait, Decreased endurance, Increased edema, Decreased activity tolerance, Decreased strength, Pain, Decreased balance, Decreased mobility, Difficulty walking, Postural dysfunction, Improper body mechanics, Decreased range of motion  Visit Diagnosis: Muscle weakness (generalized)  Other abnormalities of gait and mobility  Difficulty in walking, not elsewhere classified  Pain in thoracic spine  Repeated falls  Chronic midline low back pain without sciatica     Problem List Patient Active Problem List   Diagnosis Date Noted   CAD (coronary artery disease) 02/17/2021   Ischemic cardiomyopathy 02/17/2021   Pain and tenderness 11/10/2020   Encounter for support and coordination of transition of care 06/16/2020   S/P ankle fusion 06/01/2020   Pilon fracture of left tibia, closed, initial encounter    Displaced trimalleolar fracture of left lower leg, initial encounter for closed fracture    Abnormality of gait and mobility 05/26/2020   Migraine 05/26/2020   Diabetic retinopathy ([place]) 05/04/2020   Chronic  diarrhea of unknown origin 01/14/2020   Type 2 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, with long-term current use of insulin ([place]) 12/29/2019   Hypothyroidism 12/29/2019   Weakness of both lower extremities 11/12/2019   Debility 09/10/2019   ESRD (end stage renal disease) on dialysis ([place]) 09/10/2019   Depression 09/10/2019    Incontinence in female 09/10/2019   Anemia in chronic kidney disease 08/19/2019   MGUS (monoclonal gammopathy of unknown significance) 07/23/2019   GERD (gastroesophageal reflux disease) 02/28/2019   Essential hypertension 10/28/2018   Irregular menses 09/14/2009   Hypercholesterolemia 11/19/2008   Type 2 diabetes with nephropathy ([place]) 07/02/2007    11:32 AM, 09/13/21 [facility] physical therapy  [phone]   [facility] [address] [place], [place], [address] Phone: [phone]   Fax:  [phone]  Name: JING HOWATT MRN: [id number] Date of Birth: May 27, 1971

## 2021-09-15 ENCOUNTER — Encounter (HOSPITAL_COMMUNITY): Payer: Self-pay | Admitting: Physical Therapy

## 2021-09-15 ENCOUNTER — Other Ambulatory Visit: Payer: Self-pay

## 2021-09-15 ENCOUNTER — Ambulatory Visit (HOSPITAL_COMMUNITY): Payer: Medicaid Other | Admitting: Physical Therapy

## 2021-09-15 DIAGNOSIS — M6281 Muscle weakness (generalized): Secondary | ICD-10-CM

## 2021-09-15 DIAGNOSIS — R262 Difficulty in walking, not elsewhere classified: Secondary | ICD-10-CM

## 2021-09-15 DIAGNOSIS — R2689 Other abnormalities of gait and mobility: Secondary | ICD-10-CM

## 2021-09-15 NOTE — Therapy (Signed)
OUTPATIENT PHYSICAL THERAPY TREATMENT NOTE   Patient Name: Melanie Hall MRN: [id number] DOB:December 18, 1970, 51 y.o., female Today's Date: 09/15/2021  PCP: Merryl Hacker, No REFERRING PROVIDER: Newt Minion, MD   PT End of Session - 09/15/21 1125     Visit Number 7    Number of Visits 12    Date for PT Re-Evaluation 10/20/21    Authorization Type Medicaid re-eval done 09/08/21    Authorization Time Period 8 visits approved 3/8-10/10/21    Authorization - Visit Number 1    Authorization - Number of Visits 8    Progress Note Due on Visit 12    PT Start Time 1114    PT Stop Time 8242    PT Time Calculation (min) 42 min    Equipment Utilized During Treatment Gait belt    Activity Tolerance Patient tolerated treatment well;Patient limited by fatigue    Behavior During Therapy WFL for tasks assessed/performed             Past Medical History:  Diagnosis Date   Acute cystitis without hematuria    Acute midline thoracic back pain 35/36/1443   Acute systolic heart failure ([place]) 10/21/2019   Anemia    Arthritis    CAD (coronary artery disease)    S/p late presenting Inf STEMI 10/2019 >> PCI: DES x 2 to RCA // Myoview 8/22: EF 54, no ischemia or infarction; low risk   Cataracts, bilateral    surgery to remove   CATARACTS, BILATERAL 07/02/2007   Qualifier: Diagnosis of  By: Isla Pence     Closed fracture of left femur ([place]) 10/28/2018   Closed fracture of right ankle 11/06/2017   Diabetes mellitus    type 2   Emphysematous cystitis 08/26/2018   Encounter for gynecological examination with Papanicolaou smear of cervix 01/14/2020   Encounter for screening fecal occult blood testing 01/14/2020   Encounter for screening for malignant neoplasm of cervix 12/29/2019   Encounter for screening mammogram for malignant neoplasm of breast 12/29/2019   ESRD on hemodialysis ([place])    MWF - in [place]   GERD (gastroesophageal reflux disease)    Hyperlipidemia    Hypertension    Hypokalemia  08/26/2018   IRREGULAR MENSES 09/14/2009   Qualifier: Diagnosis of  By: Hassell Done FNP, [place]     Ischemic cardiomyopathy    Echocardiogram 10/19/2019: EF 40-45, inf WMA, Gr 1 DD, Lg L pl Eff // EF 54 by Myoview in 8/22   Loose stools 11/12/2019   Normocytic anemia 08/26/2018   PARONYCHIA, RIGHT GREAT TOE 07/30/2008   Qualifier: Diagnosis of  By: Hassell Done FNP, Nykedtra     Pressure ulcer 09/10/2019   Right arm weakness 08/08/2019   Sprain of left ankle    STEMI (ST elevation myocardial infarction) ([place]) 10/18/2019   STEMI involving right coronary artery ([place]) 10/18/2019   Stroke ([place]) 10/2019   SVD (spontaneous vaginal delivery)    x 4   Vaginosis 08/26/2018   Weakness 09/10/2019   Weakness of both lower extremities    Past Surgical History:  Procedure Laterality Date   A/V FISTULAGRAM Left 01/12/2020   Procedure: A/V FISTULAGRAM;  Surgeon: Serafina Mitchell, MD;  Location: [facility];  Service: Cardiovascular;  Laterality: Left;   A/V FISTULAGRAM N/A 03/17/2020   Procedure: A/V FISTULAGRAM - Left Arm;  Surgeon: Marty Heck, MD;  Location: [facility];  Service: Cardiovascular;  Laterality: N/A;   A/V FISTULAGRAM Left 06/23/2020   Procedure: A/V FISTULAGRAM;  Surgeon: Marty Heck, MD;  Location: [facility];  Service: Cardiovascular;  Laterality: Left;   ANKLE FUSION Left 06/01/2020   Procedure: LEFT ANKLE FUSION;  Surgeon: Newt Minion, MD;  Location: [facility];  Service: Orthopedics;  Laterality: Left;   AV FISTULA PLACEMENT Left 08/17/2019   Procedure: LEFT BRACHIAL CEPHALIC ARTERIOVENOUS (AV) FISTULA;  Surgeon: Angelia Mould, MD;  Location: [facility];  Service: Vascular;  Laterality: Left;   BIOPSY  02/28/2021   Procedure: BIOPSY;  Surgeon: Harvel Quale, MD;  [facility];  Service: Gastroenterology;;  duodenum gastric esophagus colon   COLONOSCOPY WITH PROPOFOL N/A 02/28/2021   Procedure: COLONOSCOPY WITH PROPOFOL;   Surgeon: Harvel Quale, MD;  [facility];  Service: Gastroenterology;  Laterality: N/A;  11:00   CORONARY STENT INTERVENTION N/A 10/18/2019   Procedure: CORONARY STENT INTERVENTION;  Surgeon: Sherren Mocha, MD;  Location: [facility];  Service: Cardiovascular;  Laterality: N/A;   CORONARY/GRAFT ACUTE MI REVASCULARIZATION N/A 10/18/2019   Procedure: Coronary/Graft Acute MI Revascularization;  Surgeon: Sherren Mocha, MD;  Location: [facility];  Service: Cardiovascular;  Laterality: N/A;   ESOPHAGOGASTRODUODENOSCOPY (EGD) WITH PROPOFOL N/A 02/28/2021   Procedure: ESOPHAGOGASTRODUODENOSCOPY (EGD) WITH PROPOFOL;  Surgeon: Harvel Quale, MD;  [facility];  Service: Gastroenterology;  Laterality: N/A;   EYE SURGERY Bilateral    cataracts removed   FEMUR IM NAIL Left 10/28/2018   Procedure: RETROGRADE FEMORAL NAILING;  Surgeon: Meredith Pel, MD;  Location: [facility];  Service: Orthopedics;  Laterality: Left;   IM NAILING FEMORAL SHAFT RETROGRADE Left 10/28/2018   INTRAVASCULAR ULTRASOUND/IVUS N/A 10/18/2019   Procedure: Intravascular Ultrasound/IVUS;  Surgeon: Sherren Mocha, MD;  Location: [facility];  Service: Cardiovascular;  Laterality: N/A;   IR FLUORO GUIDE CV LINE RIGHT  08/11/2019   IR THORACENTESIS ASP PLEURAL SPACE W/IMG GUIDE  11/12/2019   IR US GUIDE VASC ACCESS RIGHT  08/11/2019   KNEE SURGERY Left    LEFT HEART CATH AND CORONARY ANGIOGRAPHY N/A 10/18/2019   Procedure: LEFT HEART CATH AND CORONARY ANGIOGRAPHY;  Surgeon: Sherren Mocha, MD;  Location: [facility];  Service: Cardiovascular;  Laterality: N/A;   PERIPHERAL VASCULAR BALLOON ANGIOPLASTY Left 01/12/2020   Procedure: PERIPHERAL VASCULAR BALLOON ANGIOPLASTY;  Surgeon: Serafina Mitchell, MD;  Location: [facility];  Service: Cardiovascular;  Laterality: Left;  AVF   PERIPHERAL VASCULAR BALLOON ANGIOPLASTY Left 03/17/2020   Procedure: PERIPHERAL VASCULAR  BALLOON ANGIOPLASTY;  Surgeon: Marty Heck, MD;  Location: [facility];  Service: Cardiovascular;  Laterality: Left;  AVF   PERIPHERAL VASCULAR BALLOON ANGIOPLASTY Left 05/05/2020   Procedure: PERIPHERAL VASCULAR BALLOON ANGIOPLASTY;  Surgeon: Marty Heck, MD;  Location: [facility];  Service: Cardiovascular;  Laterality: Left;  arm fistula   RADIOLOGY WITH ANESTHESIA N/A 09/15/2019   Procedure: [facility] AND LUMBER LOWER BACK PAIN;  Surgeon: Radiologist, Medication, MD;  Location: [facility];  Service: Radiology;  Laterality: N/A;   TUBAL LIGATION     Patient Active Problem List   Diagnosis Date Noted   CAD (coronary artery disease) 02/17/2021   Ischemic cardiomyopathy 02/17/2021   Pain and tenderness 11/10/2020   Encounter for support and coordination of transition of care 06/16/2020   S/P ankle fusion 06/01/2020   Pilon fracture of left tibia, closed, initial encounter    Displaced trimalleolar fracture of left lower leg, initial encounter for closed fracture  Abnormality of gait and mobility 05/26/2020   Migraine 05/26/2020   Diabetic retinopathy ([place]) 05/04/2020   Chronic diarrhea of unknown origin 01/14/2020   Type 2 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, with long-term current use of insulin ([place]) 12/29/2019   Hypothyroidism 12/29/2019   Weakness of both lower extremities 11/12/2019   Debility 09/10/2019   ESRD (end stage renal disease) on dialysis ([place]) 09/10/2019   Depression 09/10/2019   Incontinence in female 09/10/2019   Anemia in chronic kidney disease 08/19/2019   MGUS (monoclonal gammopathy of unknown significance) 07/23/2019   GERD (gastroesophageal reflux disease) 02/28/2019   Essential hypertension 10/28/2018   Irregular menses 09/14/2009   Hypercholesterolemia 11/19/2008   Type 2 diabetes with nephropathy ([place]) 07/02/2007    REFERRING DIAG: Y07.371 (ICD-10-CM) - Weakness of both lower extremities   THERAPY  DIAG:  Muscle weakness (generalized)  Other abnormalities of gait and mobility  Difficulty in walking, not elsewhere classified   PRECAUTIONS: Falls   SUBJECTIVE: No new reports, states ongoing compliance with HEP. Pain is about the same, an 8 in left knee.   PAIN:  Are you having pain? Yes: NPRS scale: 8/10 Pain location: LT knee Pain description: aching Aggravating factors: walking, WB Relieving factors: rest     TODAY'S TREATMENT:  09/15/21 Nustep 8 min lv 3 seat7 Heel raises 2 x 10  Marching 3lb x20 each Standing hip abduction 3lb 2 x 10 each  Standing knee flexion 3lb 2 x 10 each  Step ups 4 inch x 10 each HHA x2 Sit to stand  x 10    PATIENT EDUCATION: Education details: on exercise form and function  Person educated: Patient Education method: Customer service manager Education comprehension: verbalized understanding and returned demonstration   HOME EXERCISE PROGRAM: seated march, LAQ, sit to stand, hip abduction, standing knee flexion    PT Short Term Goals - 09/08/21 1200       PT SHORT TERM GOAL #1   Title Patient will be independent with initial HEP and self-management strategies to improve functional outcomes    Baseline reports adherence to HEP    Time 3    Period Weeks    Status On-going    Target Date 09/29/21      PT SHORT TERM GOAL #2   Title Patient will be able to ambulate at least 175 feet during 2MWT with LRAD to demonstrate improved ability to perform functional mobility and associated tasks.    Baseline current 115 ft today with rollator    Status On-going              PT Long Term Goals - 09/08/21 1210       PT LONG TERM GOAL #1   Title Patient will be independent with advanced HEP and self-management strategies to improve functional outcomes    Time 6    Period Weeks    Status On-going    Target Date 10/20/21      PT LONG TERM GOAL #2   Title Patient will be able to ambulate at least 250 feet during 2MWT with  LRAD to demonstrate improved ability to perform functional mobility and associated tasks.    Baseline 115 ft today with rollator    Time 6    Period Weeks    Status On-going    Target Date 10/20/21      PT LONG TERM GOAL #3   Title Patient will have equal to or > 4+/5 MMT throughout BLE (except  RT DF due to hx of TIA)  to improve ability to perform functional mobility, stair ambulation and ADLs.    Baseline noted improvement hip F and knee ext bilat    Time 6    Period Weeks    Status On-going    Target Date 10/20/21      PT LONG TERM GOAL #4   Title Patient will be able to perform stand x 5 in < 15 seconds to demonstrate improvement in functional mobility and reduced risk for falls.    Baseline today 17 sec improved from 23 sec at eval    Time 6    Period Weeks    Status On-going    Target Date 10/20/21              Plan       Clinical Impression Statement Patient tolerated session well today. Progressed LE strengthening with added 3lb ankle weights to standing exercises. Patient cued on proper form and LE positioning during standing knee flexion and hip abduction. Patient did note increased back pain with prolonged standing and required several breaks for rest due to fatigue. Patient will continue to benefit from skilled therapy services to reduce remaining deficits and improve functional ability.      Personal Factors and Comorbidities Time since onset of injury/illness/exacerbation;Past/Current Experience;Fitness     Examination-Activity Limitations Locomotion Level;Transfers;Stand;Stairs     Examination-Participation Restrictions Cleaning;School;Community Activity     Stability/Clinical Decision Making Stable/Uncomplicated     Rehab Potential Fair     PT Frequency 2x / week     PT Duration 6 weeks     PT Treatment/Interventions ADLs/Self Care Home Management;Ultrasound;Parrafin;Neuromuscular re-education;Compression bandaging;Visual/perceptual  remediation/compensation;Scar mobilization;Fluidtherapy;Contrast Bath;Aquatic Therapy;Biofeedback;DME Instruction;Gait training;Patient/family education;Orthotic Fit/Training;Passive range of motion;Dry needling;Energy conservation;Manual techniques;Vasopneumatic Device;Taping;Splinting;Joint Manipulations;Spinal Manipulations;Cryotherapy;Stair training;Functional mobility training;Electrical Stimulation;Iontophoresis '4mg'$ /ml Dexamethasone;Therapeutic activities;Therapeutic exercise;Moist Heat;Traction;Balance training;Manual lymph drainage     PT Next Visit Plan continue to progress LE and core strengthening, balance, gait training as able; add sidestepping          11:51 AM, 09/15/21 Josue Hector PT DPT  Physical Therapist with [facility]  [phone]

## 2021-09-18 ENCOUNTER — Encounter (HOSPITAL_COMMUNITY): Payer: Medicaid Other | Admitting: Physical Therapy

## 2021-09-20 ENCOUNTER — Ambulatory Visit: Payer: Medicaid Other | Admitting: Nurse Practitioner

## 2021-09-20 ENCOUNTER — Encounter: Payer: Self-pay | Admitting: Nurse Practitioner

## 2021-09-20 ENCOUNTER — Other Ambulatory Visit: Payer: Self-pay

## 2021-09-20 ENCOUNTER — Encounter (HOSPITAL_COMMUNITY): Payer: Medicaid Other | Admitting: Physical Therapy

## 2021-09-20 VITALS — BP 142/88 | HR 97 | Ht 63.0 in | Wt 185.0 lb

## 2021-09-20 DIAGNOSIS — E039 Hypothyroidism, unspecified: Secondary | ICD-10-CM

## 2021-09-20 DIAGNOSIS — Z992 Dependence on renal dialysis: Secondary | ICD-10-CM

## 2021-09-20 DIAGNOSIS — E1122 Type 2 diabetes mellitus with diabetic chronic kidney disease: Secondary | ICD-10-CM

## 2021-09-20 DIAGNOSIS — E7800 Pure hypercholesterolemia, unspecified: Secondary | ICD-10-CM | POA: Diagnosis not present

## 2021-09-20 DIAGNOSIS — N186 End stage renal disease: Secondary | ICD-10-CM

## 2021-09-20 DIAGNOSIS — M7989 Other specified soft tissue disorders: Secondary | ICD-10-CM

## 2021-09-20 DIAGNOSIS — I10 Essential (primary) hypertension: Secondary | ICD-10-CM

## 2021-09-20 DIAGNOSIS — K529 Noninfective gastroenteritis and colitis, unspecified: Secondary | ICD-10-CM

## 2021-09-20 DIAGNOSIS — Z794 Long term (current) use of insulin: Secondary | ICD-10-CM

## 2021-09-20 MED ORDER — BD LANCET ULTRAFINE 33G MISC
2 refills | Status: AC
Start: 2021-09-20 — End: ?

## 2021-09-20 MED ORDER — BD LANCET ULTRAFINE 33G MISC: 2 refills | Status: DC

## 2021-09-20 MED ORDER — INSULIN ASPART PROT & ASPART (70-30 MIX) 100 UNIT/ML ~~LOC~~ SUSP: 10.0000 [IU] | Freq: Two times a day (BID) | SUBCUTANEOUS | 3 refills | Status: AC

## 2021-09-20 MED ORDER — COLESTIPOL HCL 1 G PO TABS: 2.0000 g | ORAL_TABLET | Freq: Two times a day (BID) | ORAL | 3 refills | Status: AC

## 2021-09-20 NOTE — Assessment & Plan Note (Signed)
Takes levothyroxine 50 mcg tablets daily ?Check TSH plus T4 labs today. ? ?

## 2021-09-20 NOTE — Assessment & Plan Note (Addendum)
Patient states that she ran out of her NovoLog Mix 70/30 injection 2 months ago, she has been drinking sugary drinks. ?Check A1c today, I discussed with patient that I will refill her to endocrinology if her A1c remains under control.  Need to avoid sugary drinks, Coke, soda, cake discussed with patient she verbalized understanding. ?NovoLog Mix 70/30 refilled today take taking units twice daily with meals. ?Continue to check blood sugars at home. ?Microalbumin urine test ordered today, on chronic dialysis. ?Currently on statin ?Check A1c today. ?Obtain report from [facility] retina. ?Need to maintain regular follow-up appointment discussed with patient patient told to call pharmacy or this office if she runs out of her medications. ?

## 2021-09-20 NOTE — Patient Instructions (Addendum)
?  It is important that you exercise regularly at least 30 minutes 5 times a week.  ?Think about what you will eat, plan ahead. ?Choose " clean, green, fresh or frozen" over canned, processed or packaged foods which are more sugary, salty and fatty. ?70 to 75% of food eaten should be vegetables and fruit. ?Three meals at set times with snacks allowed between meals, but they must be fruit or vegetables. ?Aim to eat over a 12 hour period , example 7 am to 7 pm, and STOP after  your last meal of the day. ?Drink water,generally about 64 ounces per day, no other drink is as healthy. Fruit juice is best enjoyed in a healthy way, by EATING the fruit. ? ?Thanks for choosing [facility], we consider it a privelige to serve you.  ?

## 2021-09-20 NOTE — Progress Notes (Signed)
? ?Melanie Hall     MRN: [id number]      DOB: 06-27-1971 ? ? ?HPI ?Ms. Runyon with history of essential hypertension, CAD, GERD, type 2 diabetes with nephropathy, hypothyroidism, end-stage renal disease, hyperlipidemia is here for follow up and re-evaluation of chronic medical conditions, medication management  ? ?End-stage renal disease goes to [facility] center, goes to HD on T, Thrs and Saturdays, states that her BP is sometimes low at HD.  Denies dizziness chest pain states that she has been taking medications as ordered. ? ?Pt states that she has been out of  her insulin for about 2 months, she has been taking sugary drinks patient noted to be drinking regular Sprite during the visit today.  she checks her blood 4 times daily, denies hypoglycemia, states that her fasting CBG between 120 and 160.  ? ?Patient stated that she just had a left eye surgery and she believe they checked her for retinopathy.  Goes to retina eye center in [place], Dr Malachy Moan, ? ? Pt states  that she had some vaccines at [facility], not sure if flu vaccines was given to her but she will find out. ? ?Pt c/o upper left thigh swelling since about 2 months ago  , she saw dr Rockne Menghini last week and she was told that every thing is ok. Pt denies pain in her thigh, numbness in the thigh.  ? ? ?ROS ?Denies recent fever or chills. ?Denies sinus pressure, nasal congestion, ear pain or sore throat. ?Denies chest congestion, productive cough or wheezing. ?Denies chest pains, palpitations and leg swelling ?Denies abdominal pain, nausea, vomiting,diarrhea or constipation.   ?Has chronic knee  pain, and limitation in mobility. ?Denies headaches, seizures, numbness, or tingling. ?Denies depression, anxiety or insomnia. ? ? ? ?PE ? ?BP (!) 142/88 (BP Location: Right Arm, Cuff Size: Normal)   Pulse 97   Ht _0  (1.6 m)   Wt 185 lb (83.9 kg)   LMP 12/17/2016   SpO2 94%   BMI 32.77 kg/m?  ? ?Patient alert and oriented and in no cardiopulmonary  distress. ? ?Chest: Clear to auscultation bilaterally. ? ?CVS: S1, S2 no murmurs, no S3.Regular rate. ? ?ABD: Soft non tender.  ? ?Ext: No edema ? ?MS: Left thigh appears bigger than right thigh decreased ROM spine, shoulders, hips and knees, has double upright braces on.using a walker ? ?Psych: Good eye contact, normal affect. Memory intact not anxious or depressed appearing. ? ? ?Assessment & Plan ? ?Essential hypertension ?BP Readings from Last 3 Encounters:  ?09/20/21 (!) 142/88  ?07/31/21 119/81  ?05/12/21 (!) 147/67  ?Patient states that she has not taking her medications today ?Takes carvedilol 12.5 mg twice daily, hydralazine 10 mg twice daily, isosorbide mononitrate 50 mg daily. ?Continue current medications ?DASH diet advised exercise as tolerated. ?CMP plus EGFR today ? 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 ?Check A1c today, I discussed with patient that I will refill her to endocrinology if her A1c remains under control.  Need to avoid sugary drinks, Coke, soda, cake discussed with patient she verbalized understanding. ?NovoLog Mix 70/30 refilled today take taking units twice daily with meals. ?Continue to check blood sugars at home. ?Microalbumin urine test ordered today, on chronic dialysis. ?Currently on statin ?Check A1c today. ?Obtain report from [facility] retina. ?  Need to maintain regular follow-up appointment discussed with patient patient told to call pharmacy or this office if she runs out of her medications. ? ?Hypothyroidism ?Takes levothyroxine 50 mcg tablets daily ?Check TSH plus T4 labs today. ? ? ?Hypercholesterolemia ?Chronic condition takes ezetimbe 10 mg daily, atorvastatin 80 mg daily on colestipol for diarrhea check lipid panel today avoid  fried fatty foods ?Lab Results  ?Component  Value Date  ? CHOL 130 02/16/2021  ? HDL 36 (L) 02/16/2021  ? [place] 76 02/16/2021  ? TRIG 95 02/16/2021  ? CHOLHDL 3.6 02/16/2021  ? ? ?Swelling of left lower extremity ?Left thigh appears bigger than right thigh.  Left thigh evaluated by Dr. Sharol Given recently according to  patient, no concern for DVT, denies pain numbness, has upright braces on.  ?Keep legs elevated when sitting, patient told to call orthopedics office if her symptoms gets worse.  Need to maintain close follow-up with Dr. Discussed with patient she verbalized understanding. ? ?  ?

## 2021-09-20 NOTE — Assessment & Plan Note (Signed)
Chronic condition takes ezetimbe 10 mg daily, atorvastatin 80 mg daily on colestipol for diarrhea check lipid panel today avoid  fried fatty foods ?Lab Results  ?Component Value Date  ? CHOL 130 02/16/2021  ? HDL 36 (L) 02/16/2021  ? [place] 76 02/16/2021  ? TRIG 95 02/16/2021  ? CHOLHDL 3.6 02/16/2021  ? ?

## 2021-09-20 NOTE — Assessment & Plan Note (Signed)
Left thigh appears bigger than right thigh.  Left thigh evaluated by Dr. Sharol Given recently according to  patient, no concern for DVT, denies pain numbness, has upright braces on.  ?Keep legs elevated when sitting, patient told to call orthopedics office if her symptoms gets worse.  Need to maintain close follow-up with Dr. Discussed with patient she verbalized understanding. ? ?

## 2021-09-20 NOTE — Assessment & Plan Note (Signed)
BP Readings from Last 3 Encounters:  ?09/20/21 (!) 142/88  ?07/31/21 119/81  ?05/12/21 (!) 147/67  ?Patient states that she has not taking her medications today ?Takes carvedilol 12.5 mg twice daily, hydralazine 10 mg twice daily, isosorbide mononitrate 50 mg daily. ?Continue current medications ?DASH diet advised exercise as tolerated. ?CMP plus EGFR today ?

## 2021-09-21 ENCOUNTER — Other Ambulatory Visit: Payer: Self-pay | Admitting: Nurse Practitioner

## 2021-09-21 NOTE — Progress Notes (Signed)
Her electrolytes are abnormal , she should make sure to attend dialysis as scheduled , thanks

## 2021-09-21 NOTE — Progress Notes (Signed)
Please discuss results with pt, pt should stop drinking soda, sugary drinks and take her insulin as ordered, please check with pt to see if she has picked up her prescription for insulin, she should let us know if she has any trouble getting her insulin, I have sent in an urgent referral to endo for management of her DM , she should make sure to follow up with them. A1C is uncontrolled  10.5  ? ?Thyroid function is stable , continue current medications  ? ?Her cholesterol is not at goal, she should avoid fried fatty foods, will adjust medication at her next visit if still not at goal

## 2021-09-21 NOTE — Therapy (Signed)
OUTPATIENT PHYSICAL THERAPY TREATMENT NOTE   Patient Name: Melanie Hall MRN: [id number] DOB:1970/12/16, 51 y.o., female Today's Date: 09/22/2021  PCP: Donell Beers, FNP REFERRING PROVIDER: Nadara Mustard, MD   PT End of Session - 09/22/21 1105     Visit Number 8    Number of Visits 12    Date for PT Re-Evaluation 10/20/21    Authorization Type Medicaid re-eval done 09/08/21    Authorization Time Period 8 visits approved 3/8-10/10/21    Authorization - Visit Number 3    Authorization - Number of Visits 8    Progress Note Due on Visit 12    PT Start Time 1031    PT Stop Time 1117    PT Time Calculation (min) 46 min    Equipment Utilized During Treatment Gait belt    Activity Tolerance Patient tolerated treatment well;Patient limited by fatigue    Behavior During Therapy [facility] for tasks assessed/performed              Past Medical History:  Diagnosis Date   Acute cystitis without hematuria    Acute midline thoracic back pain 12/29/2019   Acute systolic heart failure (HCC) 10/21/2019   Anemia    Arthritis    CAD (coronary artery disease)    S/p late presenting Inf STEMI 10/2019 >> PCI: DES x 2 to RCA // Myoview 8/22: EF 54, no ischemia or infarction; low risk   Cataracts, bilateral    surgery to remove   CATARACTS, BILATERAL 07/02/2007   Qualifier: Diagnosis of  By: Levon Hedger     Closed fracture of left femur (HCC) 10/28/2018   Closed fracture of right ankle 11/06/2017   Diabetes mellitus    type 2   Emphysematous cystitis 08/26/2018   Encounter for gynecological examination with Papanicolaou smear of cervix 01/14/2020   Encounter for screening fecal occult blood testing 01/14/2020   Encounter for screening for malignant neoplasm of cervix 12/29/2019   Encounter for screening mammogram for malignant neoplasm of breast 12/29/2019   ESRD on hemodialysis (HCC)    MWF - in [place]   GERD (gastroesophageal reflux disease)    Hyperlipidemia    Hypertension     Hypokalemia 08/26/2018   IRREGULAR MENSES 09/14/2009   Qualifier: Diagnosis of  By: Daphine Deutscher FNP, [place]     Ischemic cardiomyopathy    Echocardiogram 10/19/2019: EF 40-45, inf WMA, Gr 1 DD, Lg L pl Eff // EF 54 by Myoview in 8/22   Loose stools 11/12/2019   Normocytic anemia 08/26/2018   PARONYCHIA, RIGHT GREAT TOE 07/30/2008   Qualifier: Diagnosis of  By: Daphine Deutscher FNP, Nykedtra     Pressure ulcer 09/10/2019   Right arm weakness 08/08/2019   Sprain of left ankle    STEMI (ST elevation myocardial infarction) (HCC) 10/18/2019   STEMI involving right coronary artery (HCC) 10/18/2019   Stroke (HCC) 10/2019   SVD (spontaneous vaginal delivery)    x 4   Vaginosis 08/26/2018   Weakness 09/10/2019   Weakness of both lower extremities    Past Surgical History:  Procedure Laterality Date   A/V FISTULAGRAM Left 01/12/2020   Procedure: A/V FISTULAGRAM;  Surgeon: Nada Libman, MD;  [facility];  Service: Cardiovascular;  Laterality: Left;   A/V FISTULAGRAM N/A 03/17/2020   Procedure: A/V FISTULAGRAM - Left Arm;  Surgeon: Cephus Shelling, MD;  Location: [facility];  Service: Cardiovascular;  Laterality: N/A;   A/V FISTULAGRAM Left 06/23/2020  Procedure: A/V FISTULAGRAM;  Surgeon: Cephus Shelling, MD;  Location: [facility];  Service: Cardiovascular;  Laterality: Left;   ANKLE FUSION Left 06/01/2020   Procedure: LEFT ANKLE FUSION;  Surgeon: Nadara Mustard, MD;  Location: [facility] OR;  Service: Orthopedics;  Laterality: Left;   AV FISTULA PLACEMENT Left 08/17/2019   Procedure: LEFT BRACHIAL CEPHALIC ARTERIOVENOUS (AV) FISTULA;  Surgeon: Chuck Hint, MD;  Location: [facility] OR;  Service: Vascular;  Laterality: Left;   BIOPSY  02/28/2021   Procedure: BIOPSY;  Surgeon: Dolores Frame, MD;  [facility]: [facility];  Service: Gastroenterology;;  duodenum gastric esophagus colon   COLONOSCOPY WITH PROPOFOL N/A 02/28/2021   Procedure: COLONOSCOPY  WITH PROPOFOL;  Surgeon: Dolores Frame, MD;  [facility]: [facility];  Service: Gastroenterology;  Laterality: N/A;  11:00   CORONARY STENT INTERVENTION N/A 10/18/2019   Procedure: CORONARY STENT INTERVENTION;  Surgeon: Tonny Bollman, MD;  Location: [facility] INVASIVE CV LAB;  Service: Cardiovascular;  Laterality: N/A;   CORONARY/GRAFT ACUTE MI REVASCULARIZATION N/A 10/18/2019   Procedure: Coronary/Graft Acute MI Revascularization;  Surgeon: Tonny Bollman, MD;  Location: [facility] INVASIVE CV LAB;  Service: Cardiovascular;  Laterality: N/A;   ESOPHAGOGASTRODUODENOSCOPY (EGD) WITH PROPOFOL N/A 02/28/2021   Procedure: ESOPHAGOGASTRODUODENOSCOPY (EGD) WITH PROPOFOL;  Surgeon: Dolores Frame, MD;  [facility];  Service: Gastroenterology;  Laterality: N/A;   EYE SURGERY Bilateral    cataracts removed   FEMUR IM NAIL Left 10/28/2018   Procedure: RETROGRADE FEMORAL NAILING;  Surgeon: Cammy Copa, MD;  Location: [facility] OR;  Service: Orthopedics;  Laterality: Left;   IM NAILING FEMORAL SHAFT RETROGRADE Left 10/28/2018   INTRAVASCULAR ULTRASOUND/IVUS N/A 10/18/2019   Procedure: Intravascular Ultrasound/IVUS;  Surgeon: Tonny Bollman, MD;  Location: [facility] INVASIVE CV LAB;  Service: Cardiovascular;  Laterality: N/A;   IR FLUORO GUIDE CV LINE RIGHT  08/11/2019   IR THORACENTESIS ASP PLEURAL SPACE W/IMG GUIDE  11/12/2019   IR US GUIDE VASC ACCESS RIGHT  08/11/2019   KNEE SURGERY Left    LEFT HEART CATH AND CORONARY ANGIOGRAPHY N/A 10/18/2019   Procedure: LEFT HEART CATH AND CORONARY ANGIOGRAPHY;  Surgeon: Tonny Bollman, MD;  Location: [facility] INVASIVE CV LAB;  Service: Cardiovascular;  Laterality: N/A;   PERIPHERAL VASCULAR BALLOON ANGIOPLASTY Left 01/12/2020   Procedure: PERIPHERAL VASCULAR BALLOON ANGIOPLASTY;  Surgeon: Nada Libman, MD;  [facility];  Service: Cardiovascular;  Laterality: Left;  AVF   PERIPHERAL VASCULAR BALLOON ANGIOPLASTY Left 03/17/2020   Procedure:  PERIPHERAL VASCULAR BALLOON ANGIOPLASTY;  Surgeon: Cephus Shelling, MD;  Location: [facility];  Service: Cardiovascular;  Laterality: Left;  AVF   PERIPHERAL VASCULAR BALLOON ANGIOPLASTY Left 05/05/2020   Procedure: PERIPHERAL VASCULAR BALLOON ANGIOPLASTY;  Surgeon: Cephus Shelling, MD;  [facility]: [facility];  Service: Cardiovascular;  Laterality: Left;  arm fistula   RADIOLOGY WITH ANESTHESIA N/A 09/15/2019   Procedure: [facility] AND LUMBER LOWER BACK PAIN;  Surgeon: Radiologist, Medication, MD;  Location: [facility] OR;  Service: Radiology;  Laterality: N/A;   TUBAL LIGATION     Patient Active Problem List   Diagnosis Date Noted   Swelling of left lower extremity 09/20/2021   CAD (coronary artery disease) 02/17/2021   Ischemic cardiomyopathy 02/17/2021   Pain and tenderness 11/10/2020   Encounter for support and coordination of transition of care 06/16/2020   S/P ankle fusion 06/01/2020   Pilon fracture of left tibia, closed, initial encounter    Displaced trimalleolar fracture  of left lower leg, initial encounter for closed fracture    Abnormality of gait and mobility 05/26/2020   Migraine 05/26/2020   Diabetic retinopathy (HCC) 05/04/2020   Chronic diarrhea of unknown origin 01/14/2020   Type 2 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, with long-term current use of insulin (HCC) 12/29/2019   Hypothyroidism 12/29/2019   Weakness of both lower extremities 11/12/2019   Debility 09/10/2019   ESRD (end stage renal disease) on dialysis (HCC) 09/10/2019   Depression 09/10/2019   Incontinence in female 09/10/2019   Anemia in chronic kidney disease 08/19/2019   MGUS (monoclonal gammopathy of unknown significance) 07/23/2019   GERD (gastroesophageal reflux disease) 02/28/2019   Essential hypertension 10/28/2018   Irregular menses 09/14/2009   Hypercholesterolemia 11/19/2008   Type 2 diabetes with nephropathy (HCC) 07/02/2007    REFERRING DIAG:  W09.811 (ICD-10-CM) - Weakness of both lower extremities   THERAPY DIAG:  Muscle weakness (generalized)  Other abnormalities of gait and mobility  Difficulty in walking, not elsewhere classified   PRECAUTIONS: Falls   SUBJECTIVE: Patient reports she is about the same pain wise but can tell she is getting a little stronger.  Still has weakness and pain in both her legs  PAIN:  Are you having pain? Yes: NPRS scale: 7/10 Pain location: LT knee Pain description: aching Aggravating factors: walking, WB Relieving factors: rest     TODAY'S TREATMENT:   09/22/21 Nustep 8 min arms 9 seat 7 Heel raises 2 x 10  Marching 3lb x20 each Standing hip abduction 3lb 2 x 10 each  Standing hip ext 3lb 2 x 10 each  Step ups 4 inch 2 x 10 each HHA x2 Sit to stand x 10 Sidestepping in parallel bars down and back x 2, CGA    09/15/21 Nustep 8 min arms 9 seat7 Heel raises 2 x 10  Marching 3lb x20 each Standing hip abduction 3lb 2 x 10 each  Standing knee flexion 3lb 2 x 10 each  Step ups 4 inch x 10 each HHA x2 Sit to stand  x 10    PATIENT EDUCATION: Education details: on exercise form and function  Person educated: Patient Education method: Medical illustrator Education comprehension: verbalized understanding and returned demonstration   HOME EXERCISE PROGRAM: seated march, LAQ, sit to stand, hip abduction, standing knee flexion    PT Short Term Goals - 09/08/21 1200       PT SHORT TERM GOAL #1   Title Patient will be independent with initial HEP and self-management strategies to improve functional outcomes    Baseline reports adherence to HEP    Time 3    Period Weeks    Status On-going    Target Date 09/29/21      PT SHORT TERM GOAL #2   Title Patient will be able to ambulate at least 175 feet during with LRAD to demonstrate improved ability to perform functional mobility and associated tasks.    Baseline current 115 ft today with rollator    Status  On-going              PT Long Term Goals - 09/08/21 1210       PT LONG TERM GOAL #1   Title Patient will be independent with advanced HEP and self-management strategies to improve functional outcomes    Time 6    Period Weeks    Status On-going    Target Date 10/20/21      PT LONG  TERM GOAL #2   Title Patient will be able to ambulate at least 250 feet during with LRAD to demonstrate improved ability to perform functional mobility and associated tasks.    Baseline 115 ft today with rollator    Time 6    Period Weeks    Status On-going    Target Date 10/20/21      PT LONG TERM GOAL #3   Title Patient will have equal to or > 4+/5 MMT throughout BLE (except RT DF due to hx of TIA)  to improve ability to perform functional mobility, stair ambulation and ADLs.    Baseline noted improvement hip F and knee ext bilat    Time 6    Period Weeks    Status On-going    Target Date 10/20/21      PT LONG TERM GOAL #4   Title Patient will be able to perform stand x 5 in < 15 seconds to demonstrate improvement in functional mobility and reduced risk for falls.    Baseline today 17 sec improved from 23 sec at eval    Time 6    Period Weeks    Status On-going    Target Date 10/20/21              Plan       Clinical Impression Statement Patient tolerated session well today. Patient cued on proper form and LE positioning during standing exercise.  Patient tends to stand with trunk forward flexed with all standing activities. Patient needs several breaks for rest due to fatigue. Therapist added sidestepping to treatment today to work on balance with lateral movement. Increased step ups to 2 x 10 as patient states she has continued difficulty navigating steps to enter/exit her apartment.  Patient with max fatigue after treatment today. Patient will continue to benefit from skilled therapy services to reduce remaining deficits and improve functional ability.      Personal Factors and  Comorbidities Time since onset of injury/illness/exacerbation;Past/Current Experience;Fitness     Examination-Activity Limitations Locomotion Level;Transfers;Stand;Stairs     Examination-Participation Restrictions Cleaning;School;Community Activity     Stability/Clinical Decision Making Stable/Uncomplicated     Rehab Potential Fair     PT Frequency 2x / week     PT Duration 6 weeks     PT Treatment/Interventions ADLs/Self Care Home Management;Ultrasound;Parrafin;Neuromuscular re-education;Compression bandaging;Visual/perceptual remediation/compensation;Scar mobilization;Fluidtherapy;Contrast Bath;Aquatic Therapy;Biofeedback;DME Instruction;Gait training;Patient/family education;Orthotic Fit/Training;Passive range of motion;Dry needling;Energy conservation;Manual techniques;Vasopneumatic Device;Taping;Splinting;Joint Manipulations;Spinal Manipulations;Cryotherapy;Stair training;Functional mobility training;Electrical Stimulation;Iontophoresis 4mg /ml Dexamethasone;Therapeutic activities;Therapeutic exercise;Moist Heat;Traction;Balance training;Manual lymph drainage     PT Next Visit Plan continue to progress LE and core strengthening, balance, gait training as able;  try 6" step for step ups.          11:24 AM, 09/22/21 Avea Mcgowen Small Tyeesha Riker MPT [facility] [place] [phone]

## 2021-09-22 ENCOUNTER — Telehealth: Payer: Self-pay | Admitting: Pharmacist

## 2021-09-22 ENCOUNTER — Telehealth: Payer: Medicaid Other

## 2021-09-22 ENCOUNTER — Telehealth: Payer: Self-pay | Admitting: *Deleted

## 2021-09-22 ENCOUNTER — Ambulatory Visit (HOSPITAL_COMMUNITY): Payer: Medicaid Other

## 2021-09-22 ENCOUNTER — Telehealth: Payer: Self-pay

## 2021-09-22 ENCOUNTER — Other Ambulatory Visit: Payer: Self-pay

## 2021-09-22 DIAGNOSIS — M6281 Muscle weakness (generalized): Secondary | ICD-10-CM

## 2021-09-22 DIAGNOSIS — R2689 Other abnormalities of gait and mobility: Secondary | ICD-10-CM

## 2021-09-22 DIAGNOSIS — R262 Difficulty in walking, not elsewhere classified: Secondary | ICD-10-CM

## 2021-09-22 LAB — CMP14+EGFR
ALT: 7 IU/L (ref 0–32)
AST: 8 IU/L (ref 0–40)
Albumin/Globulin Ratio: 1.3 (ref 1.2–2.2)
Albumin: 3.7 g/dL — ABNORMAL LOW (ref 3.8–4.9)
Alkaline Phosphatase: 199 IU/L — ABNORMAL HIGH (ref 44–121)
BUN/Creatinine Ratio: 6 — ABNORMAL LOW (ref 9–23)
BUN: 24 mg/dL (ref 6–24)
Bilirubin Total: 0.5 mg/dL (ref 0.0–1.2)
CO2: 24 mmol/L (ref 20–29)
Calcium: 8.5 mg/dL — ABNORMAL LOW (ref 8.7–10.2)
Chloride: 94 mmol/L — ABNORMAL LOW (ref 96–106)
Creatinine, Ser: 4.25 mg/dL — ABNORMAL HIGH (ref 0.57–1.00)
Globulin, Total: 2.9 g/dL (ref 1.5–4.5)
Glucose: 554 mg/dL (ref 70–99)
Potassium: 6.1 mmol/L — ABNORMAL HIGH (ref 3.5–5.2)
Sodium: 132 mmol/L — ABNORMAL LOW (ref 134–144)
Total Protein: 6.6 g/dL (ref 6.0–8.5)
eGFR: 12 mL/min/{1.73_m2} — ABNORMAL LOW (ref >59)

## 2021-09-22 LAB — LIPID PANEL
Chol/HDL Ratio: 3.6 ratio (ref 0.0–4.4)
Cholesterol, Total: 123 mg/dL (ref 100–199)
HDL: 34 mg/dL — ABNORMAL LOW (ref >39)
LDL Chol Calc (NIH): 65 mg/dL (ref 0–99)
Triglycerides: 133 mg/dL (ref 0–149)
VLDL Cholesterol Cal: 24 mg/dL (ref 5–40)

## 2021-09-22 LAB — TSH+FREE T4
Free T4: 1.39 ng/dL (ref 0.82–1.77)
TSH: 2.19 u[IU]/mL (ref 0.450–4.500)

## 2021-09-22 LAB — HEMOGLOBIN A1C
Est. average glucose Bld gHb Est-mCnc: 255 mg/dL
Hgb A1c MFr Bld: 10.5 % — ABNORMAL HIGH (ref 4.8–5.6)

## 2021-09-22 LAB — MICROALBUMIN, URINE: Microalbumin, Urine: 2151.9 ug/mL

## 2021-09-22 NOTE — Telephone Encounter (Signed)
? ?  Telephone encounter was:  Successful.  ?09/22/2021 ?Name: SANELA EVOLA MRN: [id number] DOB: Nov 29, 1970 ? ?MUNA DEMERS is a 51 y.o. year old female who is a primary care patient of Renee Rival, FNP . The community resource team was consulted for assistance with Transportation Needs  ? ?Care guide performed the following interventions: Patient provided with information about care guide support team and interviewed to confirm resource needs.Patient stated she needs aditional transportation resources due to having several appointments a day and [facility] only allows one ride per day ? ?Follow Up Plan:  Care guide will follow up with patient by phone over the next two weeks ? ? ? ?Larena Sox ?Care Guide, Embedded Care Coordination ?[place], Care Management  ?[phone] ?[address], [place], [place] [address] ?Phone: [phone] ?Email: [email or web address] ? ?  ?

## 2021-09-22 NOTE — Telephone Encounter (Signed)
?  Care Management  ? ?Follow Up Note ? ?09/22/2021 ?Name: TIMIA CASSELMAN MRN: [id number] DOB: [birth year] ? ?Referred by: Renee Rival, FNP ?Reason for referral : Care Coordination (Unsuccessful Telephone Outreach) ? ?An unsuccessful telephone outreach was attempted today. The patient was referred to the case management team for assistance with care management and care coordination.  ? ?Follow Up Plan: The care management team will reach out to the patient again over the next 7 days.  ? ?Kennon Holter, PharmD, BCACP, CPP ?Clinical Pharmacist Practitioner ?[place] ?[phone] ?

## 2021-09-22 NOTE — Chronic Care Management (AMB) (Signed)
?  Care Management  ? ?Note ? ?09/22/2021 ?Name: Melanie Hall MRN: [id number] DOB: 1971-06-29 ? ?Melanie Hall is a 51 y.o. year old female who is a primary care patient of Renee Rival, FNP. I reached out to [name] by phone today in response to a referral sent by Melanie Hall's primary care provider.  ? ?Melanie Hall was given information about care management services today including:  ?Care management services include personalized support from designated clinical staff supervised by her physician, including individualized plan of care and coordination with other care providers ?24/7 contact phone numbers for assistance for urgent and routine care needs. ?The patient may stop care management services at any time by phone call to the office staff. ? ?Patient agreed to services and verbal consent obtained.  ? ?Follow up plan: ?Telephone appointment with care management team member scheduled for:09/22/21 ? ?Laverda Sorenson  ?Care Guide, Embedded Care Coordination ?  Care Management  ?Direct Dial: [phone] ? ?

## 2021-09-25 ENCOUNTER — Other Ambulatory Visit: Payer: Self-pay

## 2021-09-25 ENCOUNTER — Telehealth: Payer: Self-pay | Admitting: *Deleted

## 2021-09-25 ENCOUNTER — Ambulatory Visit (HOSPITAL_COMMUNITY): Payer: Medicaid Other | Admitting: Physical Therapy

## 2021-09-25 DIAGNOSIS — R2689 Other abnormalities of gait and mobility: Secondary | ICD-10-CM

## 2021-09-25 DIAGNOSIS — R262 Difficulty in walking, not elsewhere classified: Secondary | ICD-10-CM

## 2021-09-25 DIAGNOSIS — M6281 Muscle weakness (generalized): Secondary | ICD-10-CM

## 2021-09-25 MED ORDER — LANCETS 30G MISC: 5 refills | Status: AC

## 2021-09-25 MED ORDER — BLOOD GLUCOSE METER KIT: PACK | 0 refills | Status: AC

## 2021-09-25 MED ORDER — GLUCOSE BLOOD VI STRP: ORAL_STRIP | 12 refills | Status: AC

## 2021-09-25 NOTE — Chronic Care Management (AMB) (Signed)
?  Care Management  ? ?Note ? ?09/25/2021 ?Name: [name] MRN: [id number] DOB: 07/10/1970 ? ?Melanie Hall is a 51 y.o. year old female who is a primary care patient of Renee Rival, FNP and is actively engaged with the care management team. I reached out to [name] by phone today to assist with re-scheduling an initial visit with the Pharmacist ? ?Follow up plan: ?Unsuccessful telephone outreach attempt made. A HIPAA compliant phone message was left for the patient providing contact information and requesting a return call.  ?The care management team will reach out to the patient again over the next 7 days.  ?If patient returns call to provider office, please advise to call [place] at [phone]. ? ?Laverda Sorenson  ?Care Guide, Embedded Care Coordination ?[place]  Care Management  ?Direct Dial: [phone] ? ?

## 2021-09-25 NOTE — Therapy (Signed)
?OUTPATIENT PHYSICAL THERAPY TREATMENT NOTE ? ? ?Patient Name: Melanie Hall ?MRN: [id number] ?DOB:1971/02/28, 51 y.o., female ?Today's Date: 09/25/2021 ? ?PCP: Renee Rival, FNP ?REFERRING PROVIDER: Newt Minion, MD ? ? PT End of Session - 09/25/21 1133   ? ? Visit Number 9   ? Number of Visits 12   ? Date for PT Re-Evaluation 10/20/21   ? Authorization Type Medicaid re-eval done 09/08/21   ? Authorization Time Period 8 visits approved 3/8-10/10/21   ? Authorization - Visit Number 4   ? Authorization - Number of Visits 8   ? Progress Note Due on Visit 12   ? PT Start Time 1128   ? PT Stop Time 1210   ? PT Time Calculation (min) 42 min   ? Equipment Utilized During Treatment Gait belt   ? Activity Tolerance Patient tolerated treatment well;Patient limited by fatigue   ? Behavior During Therapy [facility] for tasks assessed/performed   ? ?  ?  ? ?  ? ? ? ?Past Medical History:  ?Diagnosis Date  ? Acute cystitis without hematuria   ? Acute midline thoracic back pain 12/29/2019  ? Acute systolic heart failure ([place]) 10/21/2019  ? Anemia   ? Arthritis   ? CAD (coronary artery disease)   ? S/p late presenting Inf STEMI 10/2019 >> PCI: DES x 2 to RCA // Myoview 8/22: EF 54, no ischemia or infarction; low risk  ? Cataracts, bilateral   ? surgery to remove  ? CATARACTS, BILATERAL 07/02/2007  ? Qualifier: Diagnosis of  By: Isla Pence    ? Closed fracture of left femur ([place]) 10/28/2018  ? Closed fracture of right ankle 11/06/2017  ? Diabetes mellitus   ? type 2  ? Emphysematous cystitis 08/26/2018  ? Encounter for gynecological examination with Papanicolaou smear of cervix 01/14/2020  ? Encounter for screening fecal occult blood testing 01/14/2020  ? Encounter for screening for malignant neoplasm of cervix 12/29/2019  ? Encounter for screening mammogram for malignant neoplasm of breast 12/29/2019  ? ESRD on hemodialysis ([place])   ? MWF - in [place]  ? GERD (gastroesophageal reflux disease)   ? Hyperlipidemia   ? Hypertension    ? Hypokalemia 08/26/2018  ? IRREGULAR MENSES 09/14/2009  ? Qualifier: Diagnosis of  By: Hassell Done FNP, Tori Milks    ? Ischemic cardiomyopathy   ? Echocardiogram 10/19/2019: EF 40-45, inf WMA, Gr 1 DD, Lg L pl Eff // EF 54 by Myoview in 8/22  ? Loose stools 11/12/2019  ? Normocytic anemia 08/26/2018  ? PARONYCHIA, RIGHT GREAT TOE 07/30/2008  ? Qualifier: Diagnosis of  By: Hassell Done FNP, Tori Milks    ? Pressure ulcer 09/10/2019  ? Right arm weakness 08/08/2019  ? Sprain of left ankle   ? STEMI (ST elevation myocardial infarction) ([place]) 10/18/2019  ? STEMI involving right coronary artery ([place]) 10/18/2019  ? Stroke [facility]) 10/2019  ? SVD (spontaneous vaginal delivery)   ? x 4  ? Vaginosis 08/26/2018  ? Weakness 09/10/2019  ? Weakness of both lower extremities   ? ?Past Surgical History:  ?Procedure Laterality Date  ? A/V FISTULAGRAM Left 01/12/2020  ? Procedure: A/V FISTULAGRAM;  Surgeon: Serafina Mitchell, MD;  Location: [place] [facility];  Service: Cardiovascular;  Laterality: Left;  ? A/V FISTULAGRAM N/A 03/17/2020  ? Procedure: A/V FISTULAGRAM - Left Arm;  Surgeon: Marty Heck, MD;  Location: [place] [facility];  Service: Cardiovascular;  Laterality: N/A;  ? A/V FISTULAGRAM Left 06/23/2020  ?  Procedure: A/V FISTULAGRAM;  Surgeon: Marty Heck, MD;  Location: [place] [facility];  Service: Cardiovascular;  Laterality: Left;  ? ANKLE FUSION Left 06/01/2020  ? Procedure: LEFT ANKLE FUSION;  Surgeon: Newt Minion, MD;  Location: [place];  Service: Orthopedics;  Laterality: Left;  ? AV FISTULA PLACEMENT Left 08/17/2019  ? Procedure: LEFT BRACHIAL CEPHALIC ARTERIOVENOUS (AV) FISTULA;  Surgeon: Angelia Mould, MD;  Location: [place];  Service: Vascular;  Laterality: Left;  ? BIOPSY  02/28/2021  ? Procedure: BIOPSY;  Surgeon: Harvel Quale, MD;  [facility];  Service: Gastroenterology;;  duodenum ?gastric ?esophagus ?colon  ? COLONOSCOPY WITH PROPOFOL N/A 02/28/2021  ? Procedure: COLONOSCOPY  WITH PROPOFOL;  Surgeon: Harvel Quale, MD;  [facility];  Service: Gastroenterology;  Laterality: N/A;  11:00  ? CORONARY STENT INTERVENTION N/A 10/18/2019  ? Procedure: CORONARY STENT INTERVENTION;  Surgeon: Sherren Mocha, MD;  Location: [place] [facility];  Service: Cardiovascular;  Laterality: N/A;  ? CORONARY/GRAFT ACUTE MI REVASCULARIZATION N/A 10/18/2019  ? Procedure: Coronary/Graft Acute MI Revascularization;  Surgeon: Sherren Mocha, MD;  Location: [place] [facility];  Service: Cardiovascular;  Laterality: N/A;  ? ESOPHAGOGASTRODUODENOSCOPY (EGD) WITH PROPOFOL N/A 02/28/2021  ? Procedure: ESOPHAGOGASTRODUODENOSCOPY (EGD) WITH PROPOFOL;  Surgeon: Harvel Quale, MD;  [facility];  Service: Gastroenterology;  Laterality: N/A;  ? EYE SURGERY Bilateral   ? cataracts removed  ? FEMUR IM NAIL Left 10/28/2018  ? Procedure: RETROGRADE FEMORAL NAILING;  Surgeon: Meredith Pel, MD;  Location: [place];  Service: Orthopedics;  Laterality: Left;  ? IM NAILING FEMORAL SHAFT RETROGRADE Left 10/28/2018  ? INTRAVASCULAR ULTRASOUND/IVUS N/A 10/18/2019  ? Procedure: Intravascular Ultrasound/IVUS;  Surgeon: Sherren Mocha, MD;  Location: [place] [facility];  Service: Cardiovascular;  Laterality: N/A;  ? IR FLUORO GUIDE CV LINE RIGHT  08/11/2019  ? IR THORACENTESIS ASP PLEURAL SPACE W/IMG GUIDE  11/12/2019  ? IR US GUIDE VASC ACCESS RIGHT  08/11/2019  ? KNEE SURGERY Left   ? LEFT HEART CATH AND CORONARY ANGIOGRAPHY N/A 10/18/2019  ? Procedure: LEFT HEART CATH AND CORONARY ANGIOGRAPHY;  Surgeon: Sherren Mocha, MD;  Location: [place] [facility];  Service: Cardiovascular;  Laterality: N/A;  ? PERIPHERAL VASCULAR BALLOON ANGIOPLASTY Left 01/12/2020  ? Procedure: PERIPHERAL VASCULAR BALLOON ANGIOPLASTY;  Surgeon: Serafina Mitchell, MD;  Location: [place] [facility];  Service: Cardiovascular;  Laterality: Left;  AVF  ? PERIPHERAL VASCULAR BALLOON ANGIOPLASTY Left 03/17/2020  ? Procedure:  PERIPHERAL VASCULAR BALLOON ANGIOPLASTY;  Surgeon: Marty Heck, MD;  Location: [place] [facility];  Service: Cardiovascular;  Laterality: Left;  AVF  ? PERIPHERAL VASCULAR BALLOON ANGIOPLASTY Left 05/05/2020  ? Procedure: PERIPHERAL VASCULAR BALLOON ANGIOPLASTY;  Surgeon: Marty Heck, MD;  Location: [place] [facility];  Service: Cardiovascular;  Laterality: Left;  arm fistula  ? RADIOLOGY WITH ANESTHESIA N/A 09/15/2019  ? Procedure: [facility] AND LUMBER LOWER BACK PAIN;  Surgeon: Radiologist, Medication, MD;  Location: [place];  Service: Radiology;  Laterality: N/A;  ? TUBAL LIGATION    ? ?Patient Active Problem List  ? Diagnosis Date Noted  ? Swelling of left lower extremity 09/20/2021  ? CAD (coronary artery disease) 02/17/2021  ? Ischemic cardiomyopathy 02/17/2021  ? Pain and tenderness 11/10/2020  ? Encounter for support and coordination of transition of care 06/16/2020  ? S/P ankle fusion 06/01/2020  ? Pilon fracture of left tibia, closed, initial encounter   ? Displaced trimalleolar fracture  of left lower leg, initial encounter for closed fracture   ? Abnormality of gait and mobility 05/26/2020  ? Migraine 05/26/2020  ? Diabetic retinopathy ([place]) 05/04/2020  ? Chronic diarrhea of unknown origin 01/14/2020  ? Type 2 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, with long-term current use of insulin ([place]) 12/29/2019  ? Hypothyroidism 12/29/2019  ? Weakness of both lower extremities 11/12/2019  ? Debility 09/10/2019  ? ESRD (end stage renal disease) on dialysis ([place]) 09/10/2019  ? Depression 09/10/2019  ? Incontinence in female 09/10/2019  ? Anemia in chronic kidney disease 08/19/2019  ? MGUS (monoclonal gammopathy of unknown significance) 07/23/2019  ? GERD (gastroesophageal reflux disease) 02/28/2019  ? Essential hypertension 10/28/2018  ? Irregular menses 09/14/2009  ? Hypercholesterolemia 11/19/2008  ? Type 2 diabetes with nephropathy ([place]) 07/02/2007  ? ? ?REFERRING DIAG:  R29.898 (ICD-10-CM) - Weakness of both lower extremities  ? ?THERAPY DIAG:  ?Muscle weakness (generalized) ? ?Other abnormalities of gait and mobility ? ?Difficulty in walking, not elsewhere classified ? ? ?PREC

## 2021-09-27 ENCOUNTER — Other Ambulatory Visit: Payer: Self-pay

## 2021-09-27 ENCOUNTER — Ambulatory Visit (HOSPITAL_COMMUNITY): Payer: Medicaid Other | Admitting: Physical Therapy

## 2021-09-27 DIAGNOSIS — R2689 Other abnormalities of gait and mobility: Secondary | ICD-10-CM

## 2021-09-27 DIAGNOSIS — R262 Difficulty in walking, not elsewhere classified: Secondary | ICD-10-CM

## 2021-09-27 DIAGNOSIS — M6281 Muscle weakness (generalized): Secondary | ICD-10-CM

## 2021-09-27 NOTE — Therapy (Signed)
?OUTPATIENT PHYSICAL THERAPY TREATMENT NOTE ? ? ?Patient Name: Melanie Hall ?MRN: [id number] ?DOB:09-11-1970, 51 y.o., female ?Today's Date: 09/27/2021 ? ?PCP: Renee Rival, FNP ?REFERRING PROVIDER: Newt Minion, MD ? ? PT End of Session - 09/27/21 1130   ? ? Visit Number 10   ? Number of Visits 12   ? Date for PT Re-Evaluation 10/20/21   ? Authorization Type Medicaid re-eval done 09/08/21   ? Authorization Time Period 8 visits approved 3/8-10/10/21   ? Authorization - Visit Number 5   ? Authorization - Number of Visits 8   ? Progress Note Due on Visit 12   ? PT Start Time 1122   ? PT Stop Time 9381   ? PT Time Calculation (min) 42 min   ? Equipment Utilized During Treatment Gait belt   ? Activity Tolerance Patient tolerated treatment well;Patient limited by fatigue   ? Behavior During Therapy [facility] for tasks assessed/performed   ? ?  ?  ? ?  ? ? ? ? ?Past Medical History:  ?Diagnosis Date  ? Acute cystitis without hematuria   ? Acute midline thoracic back pain 12/29/2019  ? Acute systolic heart failure ([place]) 10/21/2019  ? Anemia   ? Arthritis   ? CAD (coronary artery disease)   ? S/p late presenting Inf STEMI 10/2019 >> PCI: DES x 2 to RCA // Myoview 8/22: EF 54, no ischemia or infarction; low risk  ? Cataracts, bilateral   ? surgery to remove  ? CATARACTS, BILATERAL 07/02/2007  ? Qualifier: Diagnosis of  By: Isla Pence    ? Closed fracture of left femur ([place]) 10/28/2018  ? Closed fracture of right ankle 11/06/2017  ? Diabetes mellitus   ? type 2  ? Emphysematous cystitis 08/26/2018  ? Encounter for gynecological examination with Papanicolaou smear of cervix 01/14/2020  ? Encounter for screening fecal occult blood testing 01/14/2020  ? Encounter for screening for malignant neoplasm of cervix 12/29/2019  ? Encounter for screening mammogram for malignant neoplasm of breast 12/29/2019  ? ESRD on hemodialysis ([place])   ? MWF - in [place]  ? GERD (gastroesophageal reflux disease)   ? Hyperlipidemia   ?  Hypertension   ? Hypokalemia 08/26/2018  ? IRREGULAR MENSES 09/14/2009  ? Qualifier: Diagnosis of  By: Hassell Done FNP, Tori Milks    ? Ischemic cardiomyopathy   ? Echocardiogram 10/19/2019: EF 40-45, inf WMA, Gr 1 DD, Lg L pl Eff // EF 54 by Myoview in 8/22  ? Loose stools 11/12/2019  ? Normocytic anemia 08/26/2018  ? PARONYCHIA, RIGHT GREAT TOE 07/30/2008  ? Qualifier: Diagnosis of  By: Hassell Done FNP, Tori Milks    ? Pressure ulcer 09/10/2019  ? Right arm weakness 08/08/2019  ? Sprain of left ankle   ? STEMI (ST elevation myocardial infarction) ([place]) 10/18/2019  ? STEMI involving right coronary artery ([place]) 10/18/2019  ? Stroke [facility]) 10/2019  ? SVD (spontaneous vaginal delivery)   ? x 4  ? Vaginosis 08/26/2018  ? Weakness 09/10/2019  ? Weakness of both lower extremities   ? ?Past Surgical History:  ?Procedure Laterality Date  ? A/V FISTULAGRAM Left 01/12/2020  ? Procedure: A/V FISTULAGRAM;  Surgeon: Serafina Mitchell, MD;  Location: [place] [facility];  Service: Cardiovascular;  Laterality: Left;  ? A/V FISTULAGRAM N/A 03/17/2020  ? Procedure: A/V FISTULAGRAM - Left Arm;  Surgeon: Marty Heck, MD;  Location: [place] [facility];  Service: Cardiovascular;  Laterality: N/A;  ? A/V FISTULAGRAM Left 06/23/2020  ?  Procedure: A/V FISTULAGRAM;  Surgeon: Marty Heck, MD;  Location: [place] [facility];  Service: Cardiovascular;  Laterality: Left;  ? ANKLE FUSION Left 06/01/2020  ? Procedure: LEFT ANKLE FUSION;  Surgeon: Newt Minion, MD;  Location: [place];  Service: Orthopedics;  Laterality: Left;  ? AV FISTULA PLACEMENT Left 08/17/2019  ? Procedure: LEFT BRACHIAL CEPHALIC ARTERIOVENOUS (AV) FISTULA;  Surgeon: Angelia Mould, MD;  Location: [place];  Service: Vascular;  Laterality: Left;  ? BIOPSY  02/28/2021  ? Procedure: BIOPSY;  Surgeon: Harvel Quale, MD;  [facility];  Service: Gastroenterology;;  duodenum ?gastric ?esophagus ?colon  ? COLONOSCOPY WITH PROPOFOL N/A 02/28/2021  ? Procedure:  COLONOSCOPY WITH PROPOFOL;  Surgeon: Harvel Quale, MD;  [facility];  Service: Gastroenterology;  Laterality: N/A;  11:00  ? CORONARY STENT INTERVENTION N/A 10/18/2019  ? Procedure: CORONARY STENT INTERVENTION;  Surgeon: Sherren Mocha, MD;  Location: [place] [facility];  Service: Cardiovascular;  Laterality: N/A;  ? CORONARY/GRAFT ACUTE MI REVASCULARIZATION N/A 10/18/2019  ? Procedure: Coronary/Graft Acute MI Revascularization;  Surgeon: Sherren Mocha, MD;  Location: [place] [facility];  Service: Cardiovascular;  Laterality: N/A;  ? ESOPHAGOGASTRODUODENOSCOPY (EGD) WITH PROPOFOL N/A 02/28/2021  ? Procedure: ESOPHAGOGASTRODUODENOSCOPY (EGD) WITH PROPOFOL;  Surgeon: Harvel Quale, MD;  [facility];  Service: Gastroenterology;  Laterality: N/A;  ? EYE SURGERY Bilateral   ? cataracts removed  ? FEMUR IM NAIL Left 10/28/2018  ? Procedure: RETROGRADE FEMORAL NAILING;  Surgeon: Meredith Pel, MD;  Location: [place];  Service: Orthopedics;  Laterality: Left;  ? IM NAILING FEMORAL SHAFT RETROGRADE Left 10/28/2018  ? INTRAVASCULAR ULTRASOUND/IVUS N/A 10/18/2019  ? Procedure: Intravascular Ultrasound/IVUS;  Surgeon: Sherren Mocha, MD;  Location: [place] [facility];  Service: Cardiovascular;  Laterality: N/A;  ? IR FLUORO GUIDE CV LINE RIGHT  08/11/2019  ? IR THORACENTESIS ASP PLEURAL SPACE W/IMG GUIDE  11/12/2019  ? IR US GUIDE VASC ACCESS RIGHT  08/11/2019  ? KNEE SURGERY Left   ? LEFT HEART CATH AND CORONARY ANGIOGRAPHY N/A 10/18/2019  ? Procedure: LEFT HEART CATH AND CORONARY ANGIOGRAPHY;  Surgeon: Sherren Mocha, MD;  Location: [place] [facility];  Service: Cardiovascular;  Laterality: N/A;  ? PERIPHERAL VASCULAR BALLOON ANGIOPLASTY Left 01/12/2020  ? Procedure: PERIPHERAL VASCULAR BALLOON ANGIOPLASTY;  Surgeon: Serafina Mitchell, MD;  Location: [place] [facility];  Service: Cardiovascular;  Laterality: Left;  AVF  ? PERIPHERAL VASCULAR BALLOON ANGIOPLASTY Left 03/17/2020  ?  Procedure: PERIPHERAL VASCULAR BALLOON ANGIOPLASTY;  Surgeon: Marty Heck, MD;  Location: [place] [facility];  Service: Cardiovascular;  Laterality: Left;  AVF  ? PERIPHERAL VASCULAR BALLOON ANGIOPLASTY Left 05/05/2020  ? Procedure: PERIPHERAL VASCULAR BALLOON ANGIOPLASTY;  Surgeon: Marty Heck, MD;  Location: [place] [facility];  Service: Cardiovascular;  Laterality: Left;  arm fistula  ? RADIOLOGY WITH ANESTHESIA N/A 09/15/2019  ? Procedure: [facility] AND LUMBER LOWER BACK PAIN;  Surgeon: Radiologist, Medication, MD;  Location: [place];  Service: Radiology;  Laterality: N/A;  ? TUBAL LIGATION    ? ?Patient Active Problem List  ? Diagnosis Date Noted  ? Swelling of left lower extremity 09/20/2021  ? CAD (coronary artery disease) 02/17/2021  ? Ischemic cardiomyopathy 02/17/2021  ? Pain and tenderness 11/10/2020  ? Encounter for support and coordination of transition of care 06/16/2020  ? S/P ankle fusion 06/01/2020  ? Pilon fracture of left tibia, closed, initial encounter   ? Displaced trimalleolar fracture  of left lower leg, initial encounter for closed fracture   ? Abnormality of gait and mobility 05/26/2020  ? Migraine 05/26/2020  ? Diabetic retinopathy ([place]) 05/04/2020  ? Chronic diarrhea of unknown origin 01/14/2020  ? Type 2 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, with long-term current use of insulin ([place]) 12/29/2019  ? Hypothyroidism 12/29/2019  ? Weakness of both lower extremities 11/12/2019  ? Debility 09/10/2019  ? ESRD (end stage renal disease) on dialysis ([name]) 09/10/2019  ? Depression 09/10/2019  ? Incontinence in female 09/10/2019  ? Anemia in chronic kidney disease 08/19/2019  ? MGUS (monoclonal gammopathy of unknown significance) 07/23/2019  ? GERD (gastroesophageal reflux disease) 02/28/2019  ? Essential hypertension 10/28/2018  ? Irregular menses 09/14/2009  ? Hypercholesterolemia 11/19/2008  ? Type 2 diabetes with nephropathy ([place]) 07/02/2007   ? ? ?REFERRING DIAG: R29.898 (ICD-10-CM) - Weakness of both lower extremities  ? ?THERAPY DIAG:  ?Muscle weakness (generalized) ? ?Other abnormalities of gait and mobility ? ?Difficulty in walking, not elsewhere classified ? ? ?P

## 2021-09-28 NOTE — Chronic Care Management (AMB) (Signed)
?  Care Management  ? ?Note ? ?09/28/2021 ?Name: Melanie Hall MRN: [id number] DOB: 1971-04-28 ? ?Melanie Hall is a 51 y.o. year old female who is a primary care patient of Renee Rival, FNP and is actively engaged with the care management team. I reached out to [name] by phone today to assist with re-scheduling an initial visit with the Pharmacist ? ?Follow up plan: ?Unsuccessful telephone outreach attempt made. A HIPAA compliant phone message was left for the patient providing contact information and requesting a return call. The care management team will reach out to the patient again over the next 7 days.  ?If patient returns call to provider office, please advise to call [place] at [phone]. ? ?Laverda Sorenson  ?Care Guide, Embedded Care Coordination ?[place]  Care Management  ?Direct Dial: [phone] ? ?

## 2021-09-29 ENCOUNTER — Ambulatory Visit: Payer: Medicaid Other | Admitting: Nurse Practitioner

## 2021-10-02 ENCOUNTER — Encounter: Payer: Self-pay | Admitting: Nurse Practitioner

## 2021-10-02 ENCOUNTER — Other Ambulatory Visit: Payer: Self-pay

## 2021-10-02 ENCOUNTER — Ambulatory Visit: Payer: Medicaid Other | Admitting: Nurse Practitioner

## 2021-10-02 ENCOUNTER — Ambulatory Visit (HOSPITAL_COMMUNITY): Payer: Medicaid Other | Admitting: Physical Therapy

## 2021-10-02 DIAGNOSIS — R262 Difficulty in walking, not elsewhere classified: Secondary | ICD-10-CM

## 2021-10-02 DIAGNOSIS — M6281 Muscle weakness (generalized): Secondary | ICD-10-CM

## 2021-10-02 DIAGNOSIS — R2689 Other abnormalities of gait and mobility: Secondary | ICD-10-CM

## 2021-10-02 NOTE — Therapy (Signed)
?OUTPATIENT PHYSICAL THERAPY TREATMENT NOTE ? ? ?Patient Name: Melanie Hall ?MRN: [id number] ?DOB:04-21-1971, 51 y.o., female ?Today's Date: 10/02/2021 ? ?PCP: Renee Rival, FNP ?REFERRING PROVIDER: Newt Minion, MD ? ? PT End of Session - 10/02/21 1041   ? ? Visit Number 11   ? Number of Visits 12   ? Date for PT Re-Evaluation 10/20/21   ? Authorization Type Medicaid re-eval done 09/08/21   ? Authorization Time Period 8 visits approved 3/8-10/10/21   ? Authorization - Visit Number 6   ? Authorization - Number of Visits 8   ? Progress Note Due on Visit 12   ? PT Start Time 1041   ? PT Stop Time 1128   ? PT Time Calculation (min) 47 min   ? Equipment Utilized During Treatment Gait belt   ? Activity Tolerance Patient tolerated treatment well;Patient limited by fatigue   ? Behavior During Therapy [facility] for tasks assessed/performed   ? ?  ?  ? ?  ? ? ? ? ?Past Medical History:  ?Diagnosis Date  ? Acute cystitis without hematuria   ? Acute midline thoracic back pain 12/29/2019  ? Acute systolic heart failure ([place]) 10/21/2019  ? Anemia   ? Arthritis   ? CAD (coronary artery disease)   ? S/p late presenting Inf STEMI 10/2019 >> PCI: DES x 2 to RCA // Myoview 8/22: EF 54, no ischemia or infarction; low risk  ? Cataracts, bilateral   ? surgery to remove  ? CATARACTS, BILATERAL 07/02/2007  ? Qualifier: Diagnosis of  By: Isla Pence    ? Closed fracture of left femur ([place]) 10/28/2018  ? Closed fracture of right ankle 11/06/2017  ? Diabetes mellitus   ? type 2  ? Emphysematous cystitis 08/26/2018  ? Encounter for gynecological examination with Papanicolaou smear of cervix 01/14/2020  ? Encounter for screening fecal occult blood testing 01/14/2020  ? Encounter for screening for malignant neoplasm of cervix 12/29/2019  ? Encounter for screening mammogram for malignant neoplasm of breast 12/29/2019  ? ESRD on hemodialysis ([place])   ? MWF - in [place]  ? GERD (gastroesophageal reflux disease)   ? Hyperlipidemia   ?  Hypertension   ? Hypokalemia 08/26/2018  ? IRREGULAR MENSES 09/14/2009  ? Qualifier: Diagnosis of  By: Hassell Done FNP, Tori Milks    ? Ischemic cardiomyopathy   ? Echocardiogram 10/19/2019: EF 40-45, inf WMA, Gr 1 DD, Lg L pl Eff // EF 54 by Myoview in 8/22  ? Loose stools 11/12/2019  ? Normocytic anemia 08/26/2018  ? PARONYCHIA, RIGHT GREAT TOE 07/30/2008  ? Qualifier: Diagnosis of  By: Hassell Done FNP, Tori Milks    ? Pressure ulcer 09/10/2019  ? Right arm weakness 08/08/2019  ? Sprain of left ankle   ? STEMI (ST elevation myocardial infarction) ([place]) 10/18/2019  ? STEMI involving right coronary artery ([place]) 10/18/2019  ? Stroke [facility]) 10/2019  ? SVD (spontaneous vaginal delivery)   ? x 4  ? Vaginosis 08/26/2018  ? Weakness 09/10/2019  ? Weakness of both lower extremities   ? ?Past Surgical History:  ?Procedure Laterality Date  ? A/V FISTULAGRAM Left 01/12/2020  ? Procedure: A/V FISTULAGRAM;  Surgeon: Serafina Mitchell, MD;  Location: [place] CV [facility];  Service: Cardiovascular;  Laterality: Left;  ? A/V FISTULAGRAM N/A 03/17/2020  ? Procedure: A/V FISTULAGRAM - Left Arm;  Surgeon: Marty Heck, MD;  Location: [place] [facility];  Service: Cardiovascular;  Laterality: N/A;  ? A/V FISTULAGRAM Left 06/23/2020  ?  Procedure: A/V FISTULAGRAM;  Surgeon: Marty Heck, MD;  Location:  CV LAB;  Service: Cardiovascular;  Laterality: Left;  ? ANKLE FUSION Left 06/01/2020  ? Procedure: LEFT ANKLE FUSION;  Surgeon: Newt Minion, MD;  Location: [place];  Service: Orthopedics;  Laterality: Left;  ? AV FISTULA PLACEMENT Left 08/17/2019  ? Procedure: LEFT BRACHIAL CEPHALIC ARTERIOVENOUS (AV) FISTULA;  Surgeon: Angelia Mould, MD;  Location: [place];  Service: Vascular;  Laterality: Left;  ? BIOPSY  02/28/2021  ? Procedure: BIOPSY;  Surgeon: Harvel Quale, MD;  [facility];  Service: Gastroenterology;;  duodenum ?gastric ?esophagus ?colon  ? COLONOSCOPY WITH PROPOFOL N/A 02/28/2021  ? Procedure:  COLONOSCOPY WITH PROPOFOL;  Surgeon: Harvel Quale, MD;  [facility];  Service: Gastroenterology;  Laterality: N/A;  11:00  ? CORONARY STENT INTERVENTION N/A 10/18/2019  ? Procedure: CORONARY STENT INTERVENTION;  Surgeon: Sherren Mocha, MD;  Location: [place] CV [facility];  Service: Cardiovascular;  Laterality: N/A;  ? CORONARY/GRAFT ACUTE MI REVASCULARIZATION N/A 10/18/2019  ? Procedure: Coronary/Graft Acute MI Revascularization;  Surgeon: Sherren Mocha, MD;  Location: [place] [facility];  Service: Cardiovascular;  Laterality: N/A;  ? ESOPHAGOGASTRODUODENOSCOPY (EGD) WITH PROPOFOL N/A 02/28/2021  ? Procedure: ESOPHAGOGASTRODUODENOSCOPY (EGD) WITH PROPOFOL;  Surgeon: Harvel Quale, MD;  [facility];  Service: Gastroenterology;  Laterality: N/A;  ? EYE SURGERY Bilateral   ? cataracts removed  ? FEMUR IM NAIL Left 10/28/2018  ? Procedure: RETROGRADE FEMORAL NAILING;  Surgeon: Meredith Pel, MD;  Location: [place];  Service: Orthopedics;  Laterality: Left;  ? IM NAILING FEMORAL SHAFT RETROGRADE Left 10/28/2018  ? INTRAVASCULAR ULTRASOUND/IVUS N/A 10/18/2019  ? Procedure: Intravascular Ultrasound/IVUS;  Surgeon: Sherren Mocha, MD;  Location: [place] [facility];  Service: Cardiovascular;  Laterality: N/A;  ? IR FLUORO GUIDE CV LINE RIGHT  08/11/2019  ? IR THORACENTESIS ASP PLEURAL SPACE W/IMG GUIDE  11/12/2019  ? IR US GUIDE VASC ACCESS RIGHT  08/11/2019  ? KNEE SURGERY Left   ? LEFT HEART CATH AND CORONARY ANGIOGRAPHY N/A 10/18/2019  ? Procedure: LEFT HEART CATH AND CORONARY ANGIOGRAPHY;  Surgeon: Sherren Mocha, MD;  Location: [place] [facility];  Service: Cardiovascular;  Laterality: N/A;  ? PERIPHERAL VASCULAR BALLOON ANGIOPLASTY Left 01/12/2020  ? Procedure: PERIPHERAL VASCULAR BALLOON ANGIOPLASTY;  Surgeon: Serafina Mitchell, MD;  Location: [place] [facility];  Service: Cardiovascular;  Laterality: Left;  AVF  ? PERIPHERAL VASCULAR BALLOON ANGIOPLASTY Left 03/17/2020  ?  Procedure: PERIPHERAL VASCULAR BALLOON ANGIOPLASTY;  Surgeon: Marty Heck, MD;  Location: [place] [facility];  Service: Cardiovascular;  Laterality: Left;  AVF  ? PERIPHERAL VASCULAR BALLOON ANGIOPLASTY Left 05/05/2020  ? Procedure: PERIPHERAL VASCULAR BALLOON ANGIOPLASTY;  Surgeon: Marty Heck, MD;  Location: [place] [facility];  Service: Cardiovascular;  Laterality: Left;  arm fistula  ? RADIOLOGY WITH ANESTHESIA N/A 09/15/2019  ? Procedure: [facility] AND LUMBER LOWER BACK PAIN;  Surgeon: Radiologist, Medication, MD;  Location: [place];  Service: Radiology;  Laterality: N/A;  ? TUBAL LIGATION    ? ?Patient Active Problem List  ? Diagnosis Date Noted  ? Swelling of left lower extremity 09/20/2021  ? CAD (coronary artery disease) 02/17/2021  ? Ischemic cardiomyopathy 02/17/2021  ? Pain and tenderness 11/10/2020  ? Encounter for support and coordination of transition of care 06/16/2020  ? S/P ankle fusion 06/01/2020  ? Pilon fracture of left tibia, closed, initial encounter   ? Displaced trimalleolar fracture  of left lower leg, initial encounter for closed fracture   ? Abnormality of gait and mobility 05/26/2020  ? Migraine 05/26/2020  ? Diabetic retinopathy ([place]) 05/04/2020  ? Chronic diarrhea of unknown origin 01/14/2020  ? Type 2 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, with long-term current use of insulin ([place]) 12/29/2019  ? Hypothyroidism 12/29/2019  ? Weakness of both lower extremities 11/12/2019  ? Debility 09/10/2019  ? ESRD (end stage renal disease) on dialysis ([place]) 09/10/2019  ? Depression 09/10/2019  ? Incontinence in female 09/10/2019  ? Anemia in chronic kidney disease 08/19/2019  ? MGUS (monoclonal gammopathy of unknown significance) 07/23/2019  ? GERD (gastroesophageal reflux disease) 02/28/2019  ? Essential hypertension 10/28/2018  ? Irregular menses 09/14/2009  ? Hypercholesterolemia 11/19/2008  ? Type 2 diabetes with nephropathy ([place]) 07/02/2007   ? ? ?REFERRING DIAG: R29.898 (ICD-10-CM) - Weakness of both lower extremities  ? ?THERAPY DIAG:  ?Muscle weakness (generalized) ? ?Other abnormalities of gait and mobility ? ?Difficulty in walking, not elsewhere classified ? ? ?P

## 2021-10-02 NOTE — Chronic Care Management (AMB) (Signed)
?  Care Management  ? ?Note ? ?10/02/2021 ?Name: Melanie Hall MRN: [id number] DOB: 1970/11/03 ? ?Melanie Hall is a 51 y.o. year old female who is a primary care patient of Renee Rival, FNP and is actively engaged with the care management team. I reached out to [name] by phone today to assist with re-scheduling an initial visit with the Pharmacist ? ?Follow up plan: ?A third unsuccessful telephone outreach attempt made. A HIPAA compliant phone message was left for the patient providing contact information and requesting a return call. Unable to make contact on outreach attempts x 3. PCP Renee Rival, FNPnotified via routed documentation in medical record. We have been unable to make contact with the patient for follow up. The care management team is available to follow up with the patient after provider conversation with the patient regarding recommendation for care management engagement and subsequent re-referral to the care management team.  ? ?Laverda Sorenson  ?Care Guide, Embedded Care Coordination ?[place]  Care Management  ?Direct Dial: [phone] ? ?

## 2021-10-04 ENCOUNTER — Emergency Department (HOSPITAL_COMMUNITY): Payer: Medicaid Other

## 2021-10-04 ENCOUNTER — Other Ambulatory Visit: Payer: Self-pay

## 2021-10-04 ENCOUNTER — Inpatient Hospital Stay (HOSPITAL_COMMUNITY): Payer: Medicaid Other

## 2021-10-04 ENCOUNTER — Inpatient Hospital Stay (HOSPITAL_COMMUNITY)
Admission: EM | Admit: 2021-10-04 | Discharge: 2021-11-06 | DRG: 004 | Disposition: E | Payer: Medicaid Other | Attending: Internal Medicine | Admitting: Internal Medicine

## 2021-10-04 ENCOUNTER — Encounter (HOSPITAL_COMMUNITY): Payer: Self-pay | Admitting: Radiology

## 2021-10-04 DIAGNOSIS — Z880 Allergy status to penicillin: Secondary | ICD-10-CM

## 2021-10-04 DIAGNOSIS — G9341 Metabolic encephalopathy: Secondary | ICD-10-CM | POA: Diagnosis present

## 2021-10-04 DIAGNOSIS — E785 Hyperlipidemia, unspecified: Secondary | ICD-10-CM | POA: Diagnosis present

## 2021-10-04 DIAGNOSIS — L89151 Pressure ulcer of sacral region, stage 1: Secondary | ICD-10-CM | POA: Diagnosis present

## 2021-10-04 DIAGNOSIS — Z7189 Other specified counseling: Secondary | ICD-10-CM

## 2021-10-04 DIAGNOSIS — Z992 Dependence on renal dialysis: Secondary | ICD-10-CM | POA: Diagnosis not present

## 2021-10-04 DIAGNOSIS — E872 Acidosis, unspecified: Secondary | ICD-10-CM | POA: Diagnosis not present

## 2021-10-04 DIAGNOSIS — E11649 Type 2 diabetes mellitus with hypoglycemia without coma: Secondary | ICD-10-CM | POA: Diagnosis present

## 2021-10-04 DIAGNOSIS — R403 Persistent vegetative state: Secondary | ICD-10-CM | POA: Diagnosis not present

## 2021-10-04 DIAGNOSIS — R451 Restlessness and agitation: Secondary | ICD-10-CM | POA: Diagnosis not present

## 2021-10-04 DIAGNOSIS — G40901 Epilepsy, unspecified, not intractable, with status epilepticus: Secondary | ICD-10-CM | POA: Diagnosis not present

## 2021-10-04 DIAGNOSIS — J9811 Atelectasis: Secondary | ICD-10-CM | POA: Diagnosis not present

## 2021-10-04 DIAGNOSIS — Z955 Presence of coronary angioplasty implant and graft: Secondary | ICD-10-CM

## 2021-10-04 DIAGNOSIS — I700 Atherosclerosis of aorta: Secondary | ICD-10-CM | POA: Diagnosis present

## 2021-10-04 DIAGNOSIS — Z803 Family history of malignant neoplasm of breast: Secondary | ICD-10-CM

## 2021-10-04 DIAGNOSIS — Z808 Family history of malignant neoplasm of other organs or systems: Secondary | ICD-10-CM

## 2021-10-04 DIAGNOSIS — I5022 Chronic systolic (congestive) heart failure: Secondary | ICD-10-CM | POA: Diagnosis present

## 2021-10-04 DIAGNOSIS — Z930 Tracheostomy status: Secondary | ICD-10-CM

## 2021-10-04 DIAGNOSIS — Z9911 Dependence on respirator [ventilator] status: Secondary | ICD-10-CM

## 2021-10-04 DIAGNOSIS — Z20822 Contact with and (suspected) exposure to covid-19: Secondary | ICD-10-CM | POA: Diagnosis present

## 2021-10-04 DIAGNOSIS — Z79890 Hormone replacement therapy (postmenopausal): Secondary | ICD-10-CM

## 2021-10-04 DIAGNOSIS — R4182 Altered mental status, unspecified: Secondary | ICD-10-CM | POA: Diagnosis present

## 2021-10-04 DIAGNOSIS — I132 Hypertensive heart and chronic kidney disease with heart failure and with stage 5 chronic kidney disease, or end stage renal disease: Secondary | ICD-10-CM | POA: Diagnosis not present

## 2021-10-04 DIAGNOSIS — F29 Unspecified psychosis not due to a substance or known physiological condition: Secondary | ICD-10-CM | POA: Diagnosis not present

## 2021-10-04 DIAGNOSIS — E039 Hypothyroidism, unspecified: Secondary | ICD-10-CM | POA: Diagnosis present

## 2021-10-04 DIAGNOSIS — L89893 Pressure ulcer of other site, stage 3: Secondary | ICD-10-CM | POA: Diagnosis not present

## 2021-10-04 DIAGNOSIS — Z818 Family history of other mental and behavioral disorders: Secondary | ICD-10-CM

## 2021-10-04 DIAGNOSIS — Z8249 Family history of ischemic heart disease and other diseases of the circulatory system: Secondary | ICD-10-CM

## 2021-10-04 DIAGNOSIS — E871 Hypo-osmolality and hyponatremia: Secondary | ICD-10-CM | POA: Diagnosis not present

## 2021-10-04 DIAGNOSIS — N2581 Secondary hyperparathyroidism of renal origin: Secondary | ICD-10-CM | POA: Diagnosis present

## 2021-10-04 DIAGNOSIS — I252 Old myocardial infarction: Secondary | ICD-10-CM

## 2021-10-04 DIAGNOSIS — E669 Obesity, unspecified: Secondary | ICD-10-CM | POA: Diagnosis present

## 2021-10-04 DIAGNOSIS — Z981 Arthrodesis status: Secondary | ICD-10-CM

## 2021-10-04 DIAGNOSIS — E1165 Type 2 diabetes mellitus with hyperglycemia: Secondary | ICD-10-CM | POA: Diagnosis not present

## 2021-10-04 DIAGNOSIS — M62838 Other muscle spasm: Secondary | ICD-10-CM | POA: Diagnosis present

## 2021-10-04 DIAGNOSIS — Z794 Long term (current) use of insulin: Secondary | ICD-10-CM

## 2021-10-04 DIAGNOSIS — J9600 Acute respiratory failure, unspecified whether with hypoxia or hypercapnia: Secondary | ICD-10-CM | POA: Diagnosis not present

## 2021-10-04 DIAGNOSIS — R1310 Dysphagia, unspecified: Secondary | ICD-10-CM | POA: Diagnosis present

## 2021-10-04 DIAGNOSIS — I6783 Posterior reversible encephalopathy syndrome: Secondary | ICD-10-CM | POA: Diagnosis present

## 2021-10-04 DIAGNOSIS — I161 Hypertensive emergency: Secondary | ICD-10-CM | POA: Diagnosis present

## 2021-10-04 DIAGNOSIS — I2510 Atherosclerotic heart disease of native coronary artery without angina pectoris: Secondary | ICD-10-CM | POA: Diagnosis not present

## 2021-10-04 DIAGNOSIS — I159 Secondary hypertension, unspecified: Secondary | ICD-10-CM | POA: Diagnosis not present

## 2021-10-04 DIAGNOSIS — Z7982 Long term (current) use of aspirin: Secondary | ICD-10-CM

## 2021-10-04 DIAGNOSIS — I6522 Occlusion and stenosis of left carotid artery: Secondary | ICD-10-CM | POA: Diagnosis present

## 2021-10-04 DIAGNOSIS — Z79899 Other long term (current) drug therapy: Secondary | ICD-10-CM

## 2021-10-04 DIAGNOSIS — F419 Anxiety disorder, unspecified: Secondary | ICD-10-CM | POA: Diagnosis not present

## 2021-10-04 DIAGNOSIS — D631 Anemia in chronic kidney disease: Secondary | ICD-10-CM | POA: Diagnosis present

## 2021-10-04 DIAGNOSIS — F05 Delirium due to known physiological condition: Secondary | ICD-10-CM | POA: Diagnosis not present

## 2021-10-04 DIAGNOSIS — J9601 Acute respiratory failure with hypoxia: Secondary | ICD-10-CM | POA: Diagnosis not present

## 2021-10-04 DIAGNOSIS — Z66 Do not resuscitate: Secondary | ICD-10-CM | POA: Diagnosis not present

## 2021-10-04 DIAGNOSIS — I255 Ischemic cardiomyopathy: Secondary | ICD-10-CM | POA: Diagnosis present

## 2021-10-04 DIAGNOSIS — E876 Hypokalemia: Secondary | ICD-10-CM | POA: Diagnosis not present

## 2021-10-04 DIAGNOSIS — L89896 Pressure-induced deep tissue damage of other site: Secondary | ICD-10-CM | POA: Diagnosis present

## 2021-10-04 DIAGNOSIS — G9340 Encephalopathy, unspecified: Secondary | ICD-10-CM

## 2021-10-04 DIAGNOSIS — Z8673 Personal history of transient ischemic attack (TIA), and cerebral infarction without residual deficits: Secondary | ICD-10-CM

## 2021-10-04 DIAGNOSIS — M1990 Unspecified osteoarthritis, unspecified site: Secondary | ICD-10-CM | POA: Diagnosis present

## 2021-10-04 DIAGNOSIS — N186 End stage renal disease: Secondary | ICD-10-CM

## 2021-10-04 DIAGNOSIS — I462 Cardiac arrest due to underlying cardiac condition: Secondary | ICD-10-CM | POA: Diagnosis not present

## 2021-10-04 DIAGNOSIS — G259 Extrapyramidal and movement disorder, unspecified: Secondary | ICD-10-CM | POA: Diagnosis not present

## 2021-10-04 DIAGNOSIS — R401 Stupor: Secondary | ICD-10-CM | POA: Diagnosis not present

## 2021-10-04 DIAGNOSIS — J690 Pneumonitis due to inhalation of food and vomit: Secondary | ICD-10-CM | POA: Diagnosis present

## 2021-10-04 DIAGNOSIS — J9690 Respiratory failure, unspecified, unspecified whether with hypoxia or hypercapnia: Secondary | ICD-10-CM | POA: Diagnosis not present

## 2021-10-04 DIAGNOSIS — L8990 Pressure ulcer of unspecified site, unspecified stage: Secondary | ICD-10-CM | POA: Insufficient documentation

## 2021-10-04 DIAGNOSIS — R4020 Unspecified coma: Secondary | ICD-10-CM | POA: Diagnosis present

## 2021-10-04 DIAGNOSIS — K219 Gastro-esophageal reflux disease without esophagitis: Secondary | ICD-10-CM | POA: Diagnosis present

## 2021-10-04 DIAGNOSIS — Z5309 Procedure and treatment not carried out because of other contraindication: Secondary | ICD-10-CM | POA: Diagnosis not present

## 2021-10-04 DIAGNOSIS — I959 Hypotension, unspecified: Secondary | ICD-10-CM | POA: Diagnosis not present

## 2021-10-04 DIAGNOSIS — J9621 Acute and chronic respiratory failure with hypoxia: Secondary | ICD-10-CM | POA: Diagnosis present

## 2021-10-04 DIAGNOSIS — R161 Splenomegaly, not elsewhere classified: Secondary | ICD-10-CM | POA: Diagnosis present

## 2021-10-04 DIAGNOSIS — R001 Bradycardia, unspecified: Secondary | ICD-10-CM | POA: Diagnosis not present

## 2021-10-04 DIAGNOSIS — Z9851 Tubal ligation status: Secondary | ICD-10-CM

## 2021-10-04 DIAGNOSIS — Z781 Physical restraint status: Secondary | ICD-10-CM

## 2021-10-04 DIAGNOSIS — Z87891 Personal history of nicotine dependence: Secondary | ICD-10-CM | POA: Diagnosis not present

## 2021-10-04 DIAGNOSIS — Z833 Family history of diabetes mellitus: Secondary | ICD-10-CM

## 2021-10-04 DIAGNOSIS — E1122 Type 2 diabetes mellitus with diabetic chronic kidney disease: Secondary | ICD-10-CM | POA: Diagnosis present

## 2021-10-04 DIAGNOSIS — Z515 Encounter for palliative care: Secondary | ICD-10-CM | POA: Diagnosis not present

## 2021-10-04 DIAGNOSIS — Z91041 Radiographic dye allergy status: Secondary | ICD-10-CM

## 2021-10-04 DIAGNOSIS — R569 Unspecified convulsions: Secondary | ICD-10-CM | POA: Diagnosis not present

## 2021-10-04 DIAGNOSIS — L89150 Pressure ulcer of sacral region, unstageable: Secondary | ICD-10-CM | POA: Diagnosis present

## 2021-10-04 DIAGNOSIS — R0489 Hemorrhage from other sites in respiratory passages: Secondary | ICD-10-CM | POA: Diagnosis not present

## 2021-10-04 DIAGNOSIS — E875 Hyperkalemia: Secondary | ICD-10-CM | POA: Diagnosis present

## 2021-10-04 DIAGNOSIS — Z6834 Body mass index (BMI) 34.0-34.9, adult: Secondary | ICD-10-CM

## 2021-10-04 DIAGNOSIS — R509 Fever, unspecified: Secondary | ICD-10-CM | POA: Diagnosis not present

## 2021-10-04 LAB — GLUCOSE, CAPILLARY
Glucose-Capillary: 75 mg/dL (ref 70–99)
Glucose-Capillary: 63 mg/dL — ABNORMAL LOW (ref 70–99)
Glucose-Capillary: 146 mg/dL — ABNORMAL HIGH (ref 70–99)
Glucose-Capillary: 128 mg/dL — ABNORMAL HIGH (ref 70–99)

## 2021-10-04 LAB — ETHANOL: Alcohol, Ethyl (B): <10 mg/dL (ref <10)

## 2021-10-04 LAB — COMPREHENSIVE METABOLIC PANEL
ALT: 12 U/L (ref 0–44)
AST: 19 U/L (ref 15–41)
Albumin: 3.6 g/dL (ref 3.5–5.0)
Alkaline Phosphatase: 144 U/L — ABNORMAL HIGH (ref 38–126)
Anion gap: 10 (ref 5–15)
BUN: 40 mg/dL — ABNORMAL HIGH (ref 6–20)
CO2: 24 mmol/L (ref 22–32)
Calcium: 8.3 mg/dL — ABNORMAL LOW (ref 8.9–10.3)
Chloride: 102 mmol/L (ref 98–111)
Creatinine, Ser: 5.29 mg/dL — ABNORMAL HIGH (ref 0.44–1.00)
GFR, Estimated: 9 mL/min — ABNORMAL LOW (ref >60)
Glucose, Bld: 137 mg/dL — ABNORMAL HIGH (ref 70–99)
Potassium: 6.3 mmol/L (ref 3.5–5.1)
Sodium: 136 mmol/L (ref 135–145)
Total Bilirubin: 0.9 mg/dL (ref 0.3–1.2)
Total Protein: 7.6 g/dL (ref 6.5–8.1)

## 2021-10-04 LAB — BLOOD GAS, ARTERIAL
Acid-base deficit: 0.8 mmol/L (ref 0.0–2.0)
Bicarbonate: 23.2 mmol/L (ref 20.0–28.0)
Drawn by: 23430
FIO2: 100 %
O2 Saturation: 99.4 %
Patient temperature: 37.4
pCO2 arterial: 36 mmHg (ref 32–48)
pH, Arterial: 7.42 (ref 7.35–7.45)
pO2, Arterial: 465 mmHg — ABNORMAL HIGH (ref 83–108)

## 2021-10-04 LAB — RAPID HIV SCREEN (HIV 1/2 AB+AG)
HIV 1/2 Antibodies: NONREACTIVE
HIV-1 P24 Antigen - HIV24: NONREACTIVE

## 2021-10-04 LAB — CBC WITH DIFFERENTIAL/PLATELET
Abs Immature Granulocytes: 0.06 10*3/uL (ref 0.00–0.07)
Basophils Absolute: 0 10*3/uL (ref 0.0–0.1)
Basophils Relative: 0 %
Eosinophils Absolute: 0.1 10*3/uL (ref 0.0–0.5)
Eosinophils Relative: 1 %
HCT: 36.1 % (ref 36.0–46.0)
Hemoglobin: 11.2 g/dL — ABNORMAL LOW (ref 12.0–15.0)
Immature Granulocytes: 1 %
Lymphocytes Relative: 8 %
Lymphs Abs: 0.9 10*3/uL (ref 0.7–4.0)
MCH: 34.3 pg — ABNORMAL HIGH (ref 26.0–34.0)
MCHC: 31 g/dL (ref 30.0–36.0)
MCV: 110.4 fL — ABNORMAL HIGH (ref 80.0–100.0)
Monocytes Absolute: 0.8 10*3/uL (ref 0.1–1.0)
Monocytes Relative: 7 %
Neutro Abs: 9.4 10*3/uL — ABNORMAL HIGH (ref 1.7–7.7)
Neutrophils Relative %: 83 %
Platelets: 229 10*3/uL (ref 150–400)
RBC: 3.27 MIL/uL — ABNORMAL LOW (ref 3.87–5.11)
RDW: 16.4 % — ABNORMAL HIGH (ref 11.5–15.5)
WBC: 11.3 10*3/uL — ABNORMAL HIGH (ref 4.0–10.5)
nRBC: 0 % (ref 0.0–0.2)

## 2021-10-04 LAB — POTASSIUM: Potassium: 5.4 mmol/L — ABNORMAL HIGH (ref 3.5–5.1)

## 2021-10-04 LAB — BASIC METABOLIC PANEL
Anion gap: 13 (ref 5–15)
BUN: 39 mg/dL — ABNORMAL HIGH (ref 6–20)
CO2: 22 mmol/L (ref 22–32)
Calcium: 8.1 mg/dL — ABNORMAL LOW (ref 8.9–10.3)
Chloride: 104 mmol/L (ref 98–111)
Creatinine, Ser: 5.44 mg/dL — ABNORMAL HIGH (ref 0.44–1.00)
GFR, Estimated: 9 mL/min — ABNORMAL LOW (ref >60)
Glucose, Bld: 61 mg/dL — ABNORMAL LOW (ref 70–99)
Potassium: 5.7 mmol/L — ABNORMAL HIGH (ref 3.5–5.1)
Sodium: 139 mmol/L (ref 135–145)

## 2021-10-04 LAB — CBG MONITORING, ED
Glucose-Capillary: 120 mg/dL — ABNORMAL HIGH (ref 70–99)
Glucose-Capillary: 107 mg/dL — ABNORMAL HIGH (ref 70–99)

## 2021-10-04 LAB — TROPONIN I (HIGH SENSITIVITY)
Troponin I (High Sensitivity): 10 ng/L (ref <18)
Troponin I (High Sensitivity): 11 ng/L (ref <18)

## 2021-10-04 LAB — LACTIC ACID, PLASMA
Lactic Acid, Venous: 1.5 mmol/L (ref 0.5–1.9)
Lactic Acid, Venous: 1.2 mmol/L (ref 0.5–1.9)

## 2021-10-04 LAB — AMMONIA: Ammonia: 42 umol/L — ABNORMAL HIGH (ref 9–35)

## 2021-10-04 LAB — MRSA NEXT GEN BY PCR, NASAL: MRSA by PCR Next Gen: NOT DETECTED

## 2021-10-04 LAB — VITAMIN B12: Vitamin B-12: 352 pg/mL (ref 180–914)

## 2021-10-04 LAB — PROTIME-INR
INR: 1.1 (ref 0.8–1.2)
Prothrombin Time: 13.7 seconds (ref 11.4–15.2)

## 2021-10-04 LAB — RESP PANEL BY RT-PCR (FLU A&B, COVID) ARPGX2
Influenza A by PCR: NEGATIVE
Influenza B by PCR: NEGATIVE
SARS Coronavirus 2 by RT PCR: NEGATIVE

## 2021-10-04 LAB — TSH: TSH: 2.732 u[IU]/mL (ref 0.350–4.500)

## 2021-10-04 LAB — FOLATE: Folate: 9 ng/mL (ref >5.9)

## 2021-10-04 LAB — APTT: aPTT: 34 seconds (ref 24–36)

## 2021-10-04 IMAGING — DX DG CHEST 1V PORT
1 series · 1 of 1 positions shown · non-contrast
Comparison: [DATE]

CLINICAL DATA: Shortness of breath

EXAM:
PORTABLE CHEST 1 VIEW

[chest ap]
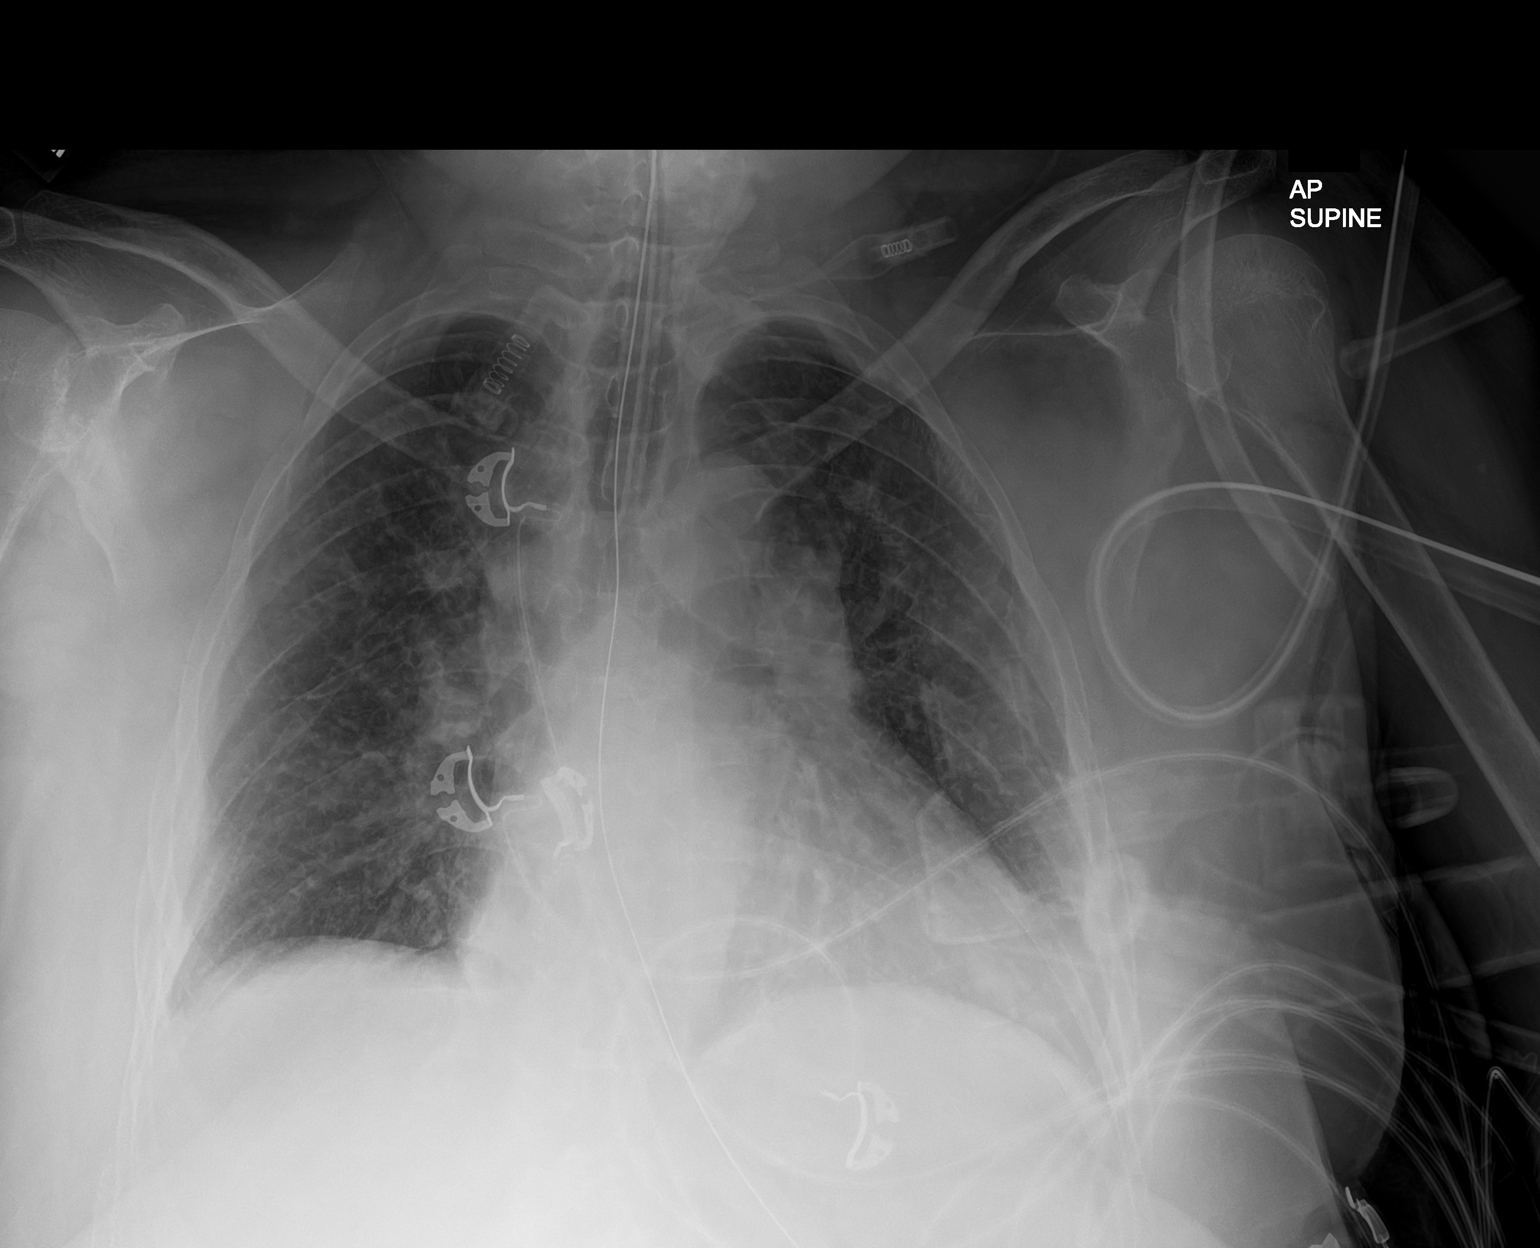

[1 of 1 positions shown; findings below may reference images not displayed]

FINDINGS: Cardiomegaly. Endotracheal tube with tip positioned over the mid
trachea. Esophagogastric tube with tip and side port below the
diaphragm. Pulmonary vascular prominence without overt edema. No
focal airspace opacity. The visualized skeletal structures are
unremarkable.
IMPRESSION: 1. Endotracheal tube with tip positioned over the mid trachea.
2. Esophagogastric tube with tip and side port below the diaphragm.
3. Cardiomegaly. Pulmonary vascular prominence without overt edema.
Airspace opacity.

## 2021-10-04 IMAGING — CT CT HEAD W/O CM
4 series · 16 of 47 positions shown, 18 images · non-contrast
Comparison: Head CT [DATE]

CLINICAL DATA: Neuro deficit, acute, stroke suspected. Seizure-like
activity.



[Series 2: head w o · axial · 0.39mm/px · z∈[-18,+102]mm · 7 of 33 slices shown, 9 images]
[im 5/33  brain]
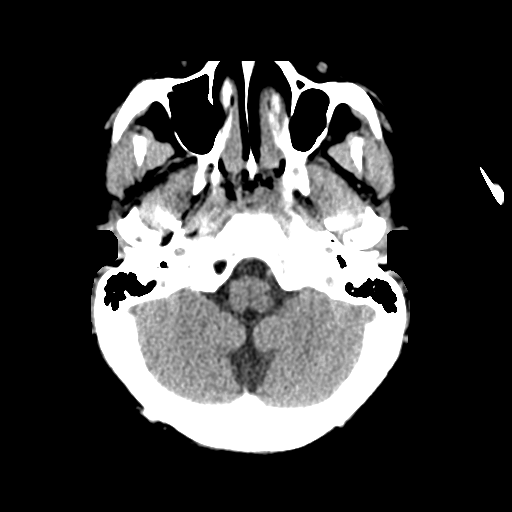
[im 5/33  bone]
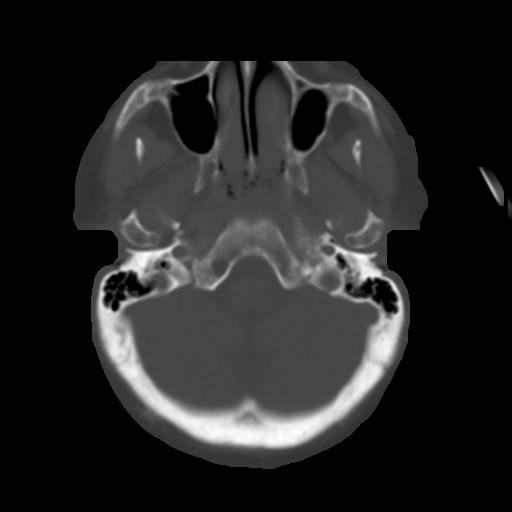
[im 9/33  brain]
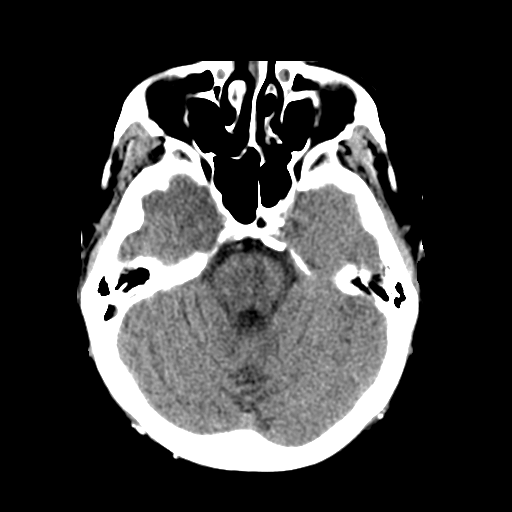
[im 13/33  brain]
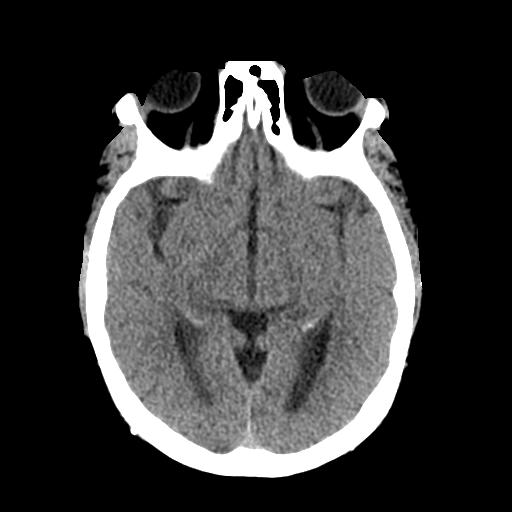
[im 17/33  brain]
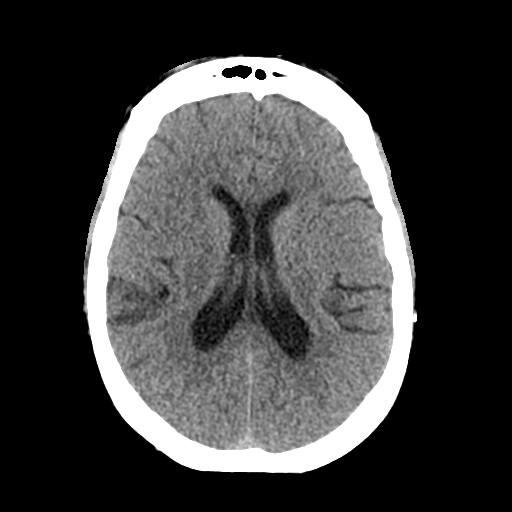
[im 21/33  brain]
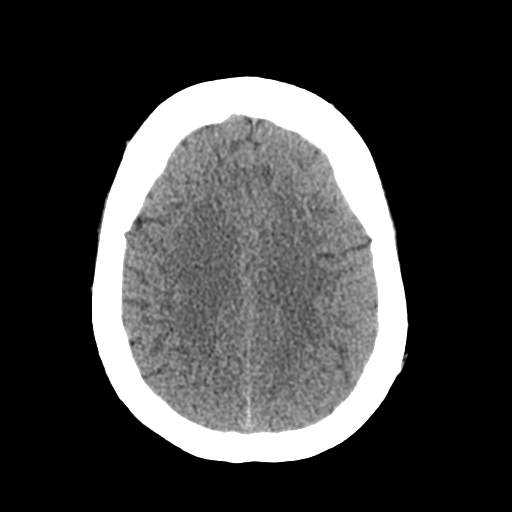
[im 21/33  bone]
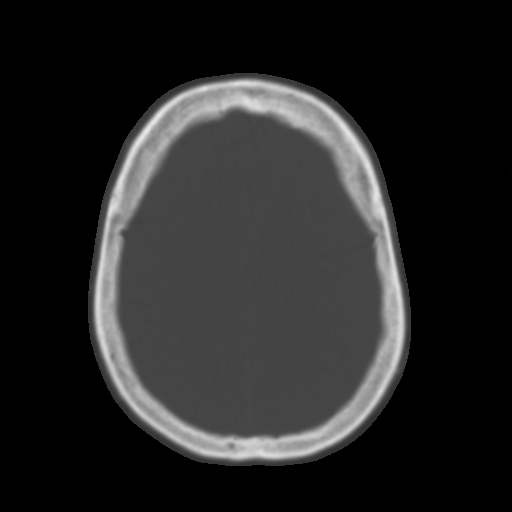
[im 25/33  brain]
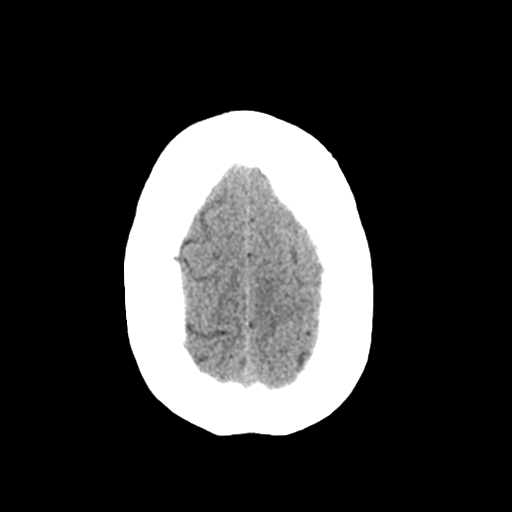
[im 29/33  brain]
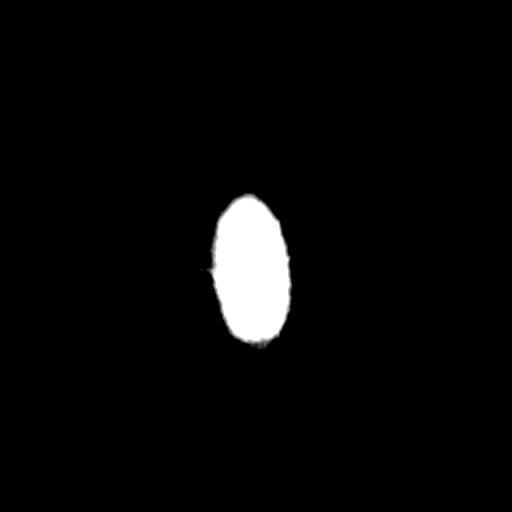

[Series 3: head bone · axial · 0.39mm/px · z∈[-22,+10]mm · 3 of 81 slices shown]
[im 9/81  bone]
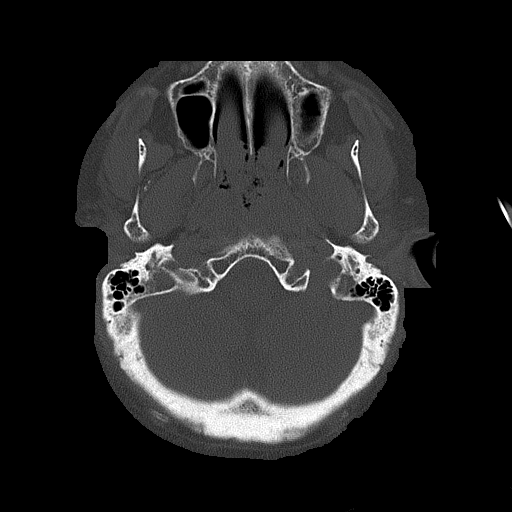
[im 17/81  bone]
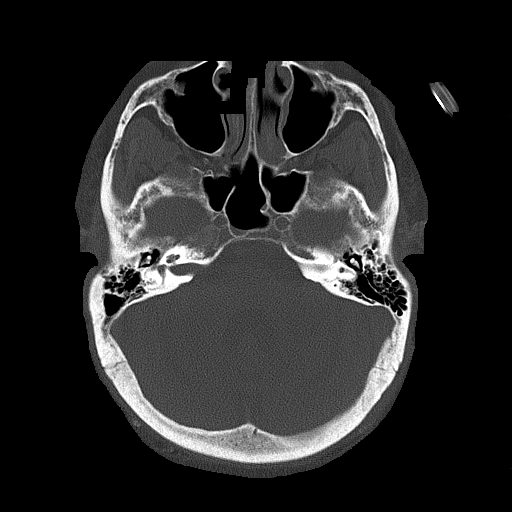
[im 25/81  bone]
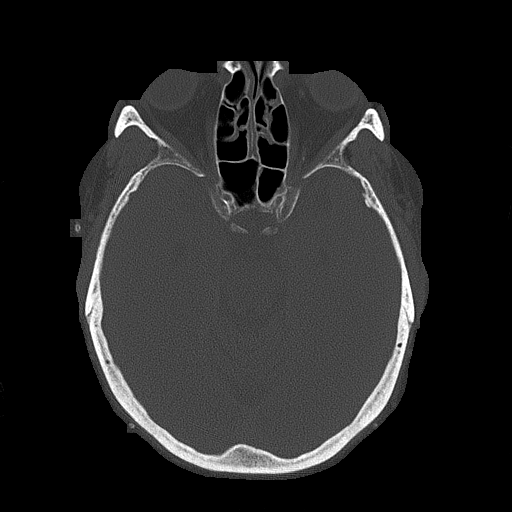

[Series 4: coronal soft · coronal · 0.33mm/px · 3 of 68 slices shown]
[im 23/68  brain]
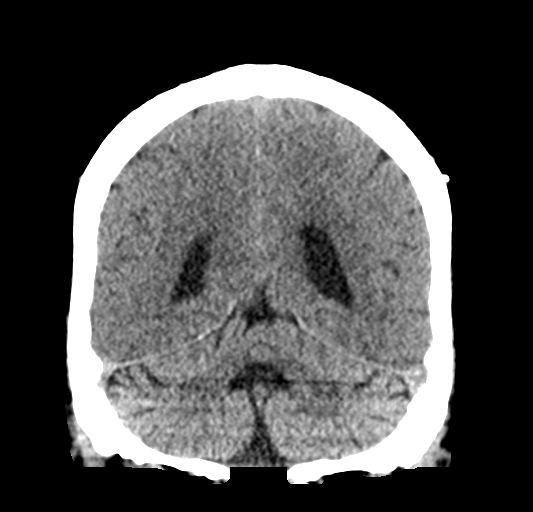
[im 30/68  brain]
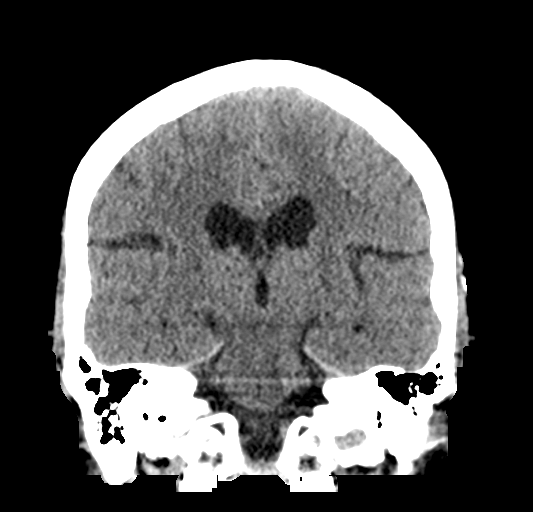
[im 38/68  brain]
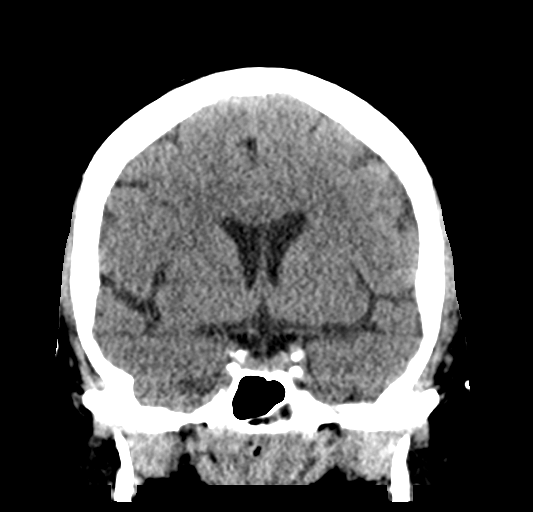

[Series 5: sagittal soft · sagittal · 0.31mm/px · 3 of 54 slices shown]
[im 18/54  brain]
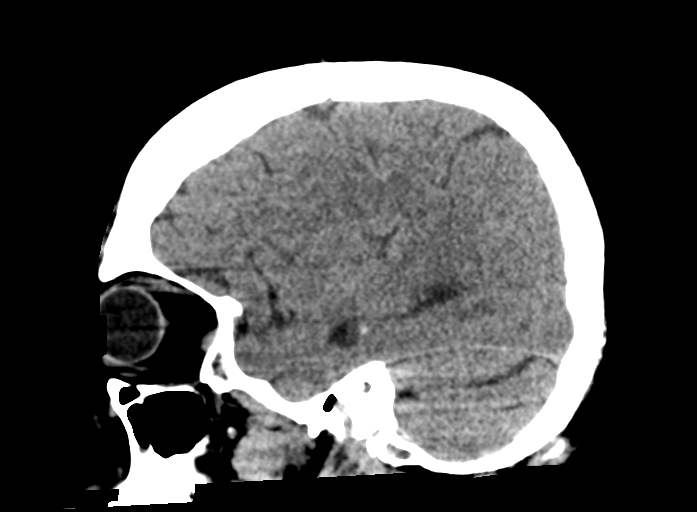
[im 27/54  brain]
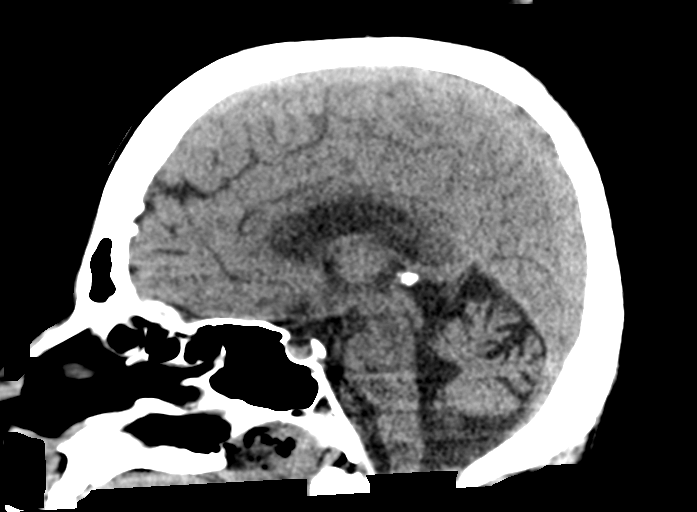
[im 36/54  brain]
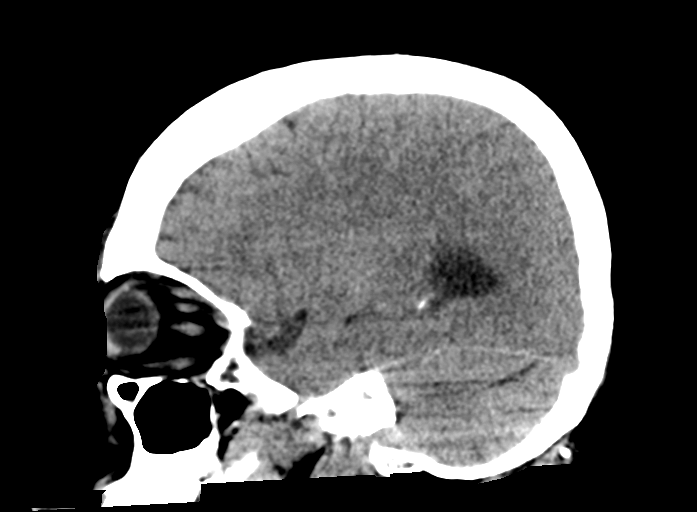

[16 of 47 positions shown; findings below may reference images not displayed]

FINDINGS: Brain: No evidence of acute infarction, hemorrhage, hydrocephalus,
extra-axial collection or mass lesion/mass effect.

Vascular: No hyperdense vessel or unexpected calcification.

Skull: Normal. Negative for fracture or focal lesion.

Sinuses/Orbits: Mild mucosal thickening in the ethmoid air cells.
Mild mucosal thickening in the left maxillary sinus. There is gas in
the nasopharynx likely related to the patient's orogastric tube and
endotracheal tube.

Other: Again noted is a tiny calcification in the posterior left
orbit on sequence 2 image 12.
IMPRESSION: No acute intracranial abnormality.

## 2021-10-04 IMAGING — DX DG ABD PORTABLE 1V
1 series · 1 of 1 positions shown · non-contrast
Comparison: [DATE]

CLINICAL DATA: NG tube placement

EXAM:
PORTABLE ABDOMEN - 1 VIEW

[abdomen kub]
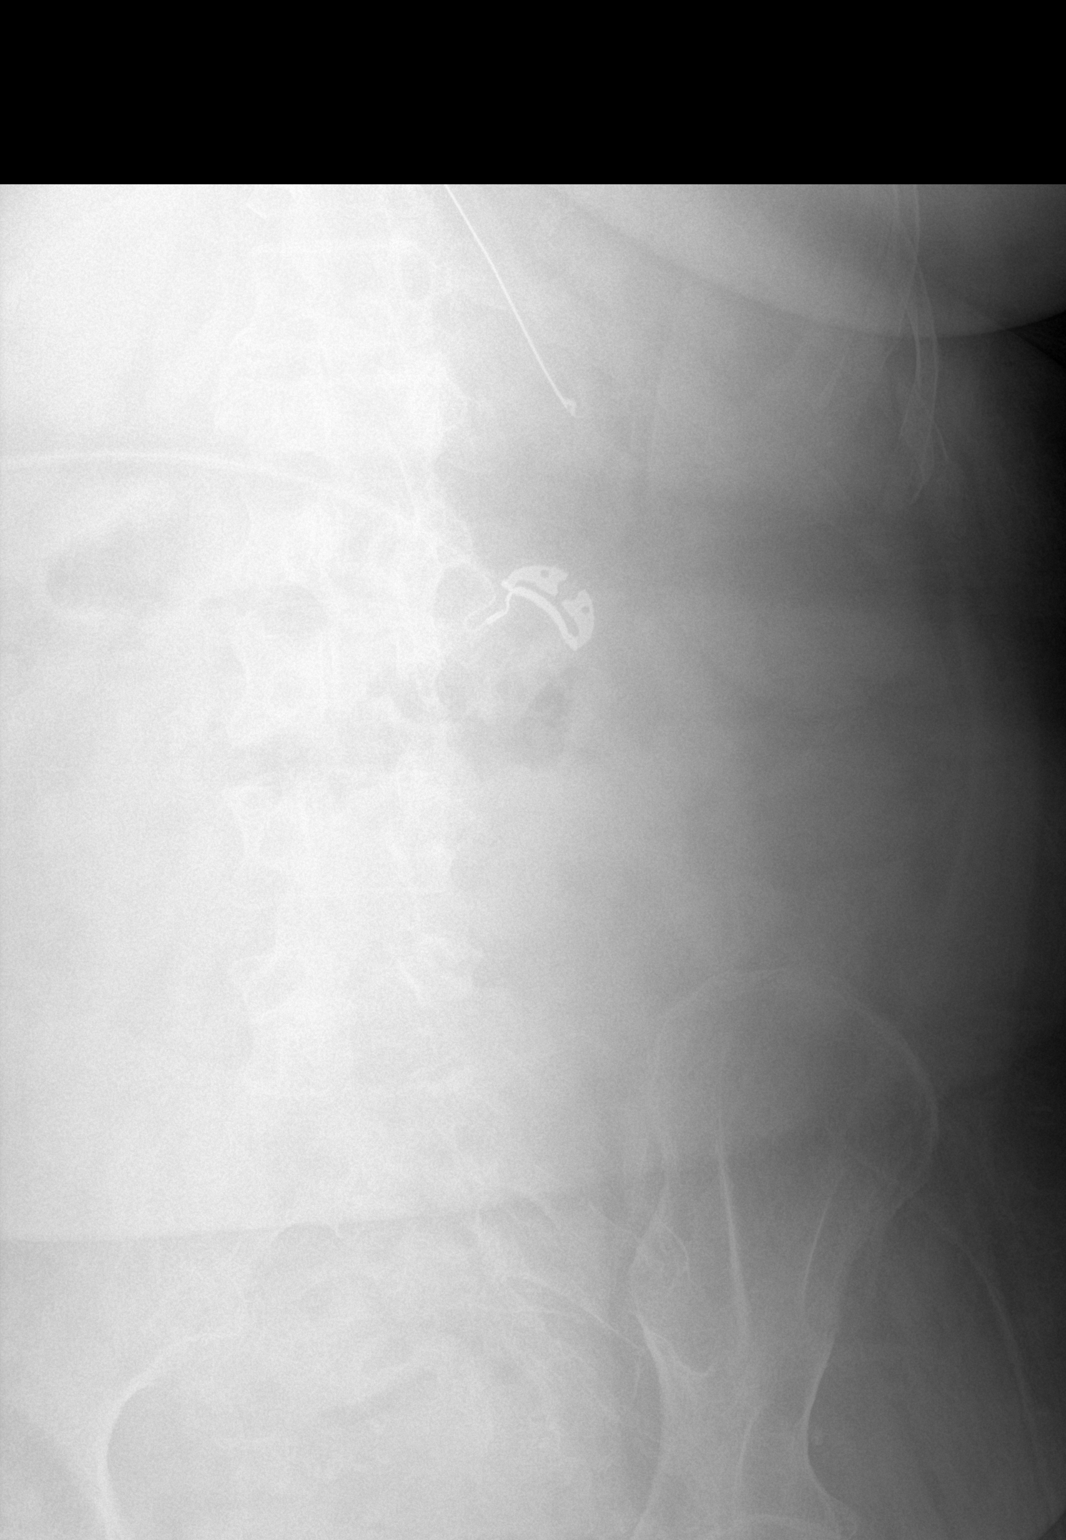

[1 of 1 positions shown; findings below may reference images not displayed]

FINDINGS: Right side of the abdomen and lower pelvis are not included in the
image. Tip of enteric tube is seen in the body of stomach. Side-port
in the enteric tube is at the gastroesophageal junction. Bowel gas
pattern is nonspecific.
IMPRESSION: Tip of enteric tube is seen in the stomach. Side-port is noted at
the gastroesophageal junction. NG tube could be advanced 5 cm to
place the side port within the stomach.

## 2021-10-04 MED ORDER — EZETIMIBE 10 MG PO TABS: 10.0000 mg | ORAL_TABLET | Freq: Every day | ORAL | Status: DC

## 2021-10-04 MED ORDER — CHLORHEXIDINE GLUCONATE 0.12% ORAL RINSE (MEDLINE KIT)
15.0000 mL | Freq: Two times a day (BID) | OROMUCOSAL | Status: DC
  Administered 2021-10-04 – 2021-10-19 (×30): 15 mL via OROMUCOSAL

## 2021-10-04 MED ORDER — PROPOFOL 1000 MG/100ML IV EMUL
0.0000 ug/kg/min | INTRAVENOUS | Status: DC
  Administered 2021-10-04: 25 ug/kg/min via INTRAVENOUS
  Administered 2021-10-04: 30 ug/kg/min via INTRAVENOUS
  Administered 2021-10-05 (×2): 40 ug/kg/min via INTRAVENOUS
  Filled 2021-10-04 (×3): qty 100
  Filled 2021-10-04: qty 300

## 2021-10-04 MED ORDER — SODIUM CHLORIDE 0.9 % IV SOLN: INTRAVENOUS | Status: DC

## 2021-10-04 MED ORDER — SUCCINYLCHOLINE CHLORIDE 200 MG/10ML IV SOSY
PREFILLED_SYRINGE | INTRAVENOUS | Status: AC
  Filled 2021-10-04: qty 10

## 2021-10-04 MED ORDER — EZETIMIBE 10 MG PO TABS
10.0000 mg | ORAL_TABLET | Freq: Every day | ORAL | Status: DC
  Administered 2021-10-05 – 2021-11-01 (×28): 10 mg
  Filled 2021-10-04 (×28): qty 1

## 2021-10-04 MED ORDER — CARVEDILOL 12.5 MG PO TABS: 12.5000 mg | ORAL_TABLET | Freq: Two times a day (BID) | ORAL | Status: DC

## 2021-10-04 MED ORDER — METRONIDAZOLE 500 MG/100ML IV SOLN
500.0000 mg | Freq: Once | INTRAVENOUS | Status: AC
  Administered 2021-10-04: 500 mg via INTRAVENOUS
  Filled 2021-10-04: qty 100

## 2021-10-04 MED ORDER — SODIUM CHLORIDE 0.9 % IV SOLN: 2.0000 g | INTRAVENOUS | Status: DC

## 2021-10-04 MED ORDER — INSULIN ASPART 100 UNIT/ML IJ SOLN
0.0000 [IU] | INTRAMUSCULAR | Status: DC
  Administered 2021-10-05: 4 [IU] via SUBCUTANEOUS
  Administered 2021-10-05: 1 [IU] via SUBCUTANEOUS
  Administered 2021-10-05: 2 [IU] via SUBCUTANEOUS
  Administered 2021-10-06 (×2): 1 [IU] via SUBCUTANEOUS
  Administered 2021-10-06 (×3): 2 [IU] via SUBCUTANEOUS
  Administered 2021-10-07: 3 [IU] via SUBCUTANEOUS
  Administered 2021-10-07: 1 [IU] via SUBCUTANEOUS
  Administered 2021-10-07: 3 [IU] via SUBCUTANEOUS
  Administered 2021-10-07: 1 [IU] via SUBCUTANEOUS
  Administered 2021-10-07: 2 [IU] via SUBCUTANEOUS
  Administered 2021-10-07 – 2021-10-08 (×5): 4 [IU] via SUBCUTANEOUS
  Administered 2021-10-08: 3 [IU] via SUBCUTANEOUS
  Administered 2021-10-08 – 2021-10-09 (×4): 5 [IU] via SUBCUTANEOUS

## 2021-10-04 MED ORDER — HEPARIN SODIUM (PORCINE) 5000 UNIT/ML IJ SOLN
5000.0000 [IU] | Freq: Three times a day (TID) | INTRAMUSCULAR | Status: DC
  Administered 2021-10-04 – 2021-10-18 (×43): 5000 [IU] via SUBCUTANEOUS
  Filled 2021-10-04 (×43): qty 1

## 2021-10-04 MED ORDER — LEVOTHYROXINE SODIUM 50 MCG PO TABS
50.0000 ug | ORAL_TABLET | Freq: Every day | ORAL | Status: DC
Start: 2021-10-05 — End: 2021-10-04

## 2021-10-04 MED ORDER — DEXTROSE 50 % IV SOLN
12.5000 g | INTRAVENOUS | Status: AC
  Administered 2021-10-04: 12.5 g via INTRAVENOUS

## 2021-10-04 MED ORDER — FENTANYL CITRATE PF 50 MCG/ML IJ SOSY
PREFILLED_SYRINGE | INTRAMUSCULAR | Status: AC
  Filled 2021-10-04: qty 2

## 2021-10-04 MED ORDER — LEVOTHYROXINE SODIUM 50 MCG PO TABS
50.0000 ug | ORAL_TABLET | Freq: Every day | ORAL | Status: DC
  Administered 2021-10-05 – 2021-11-02 (×29): 50 ug
  Filled 2021-10-04 (×30): qty 1

## 2021-10-04 MED ORDER — ATORVASTATIN CALCIUM 40 MG PO TABS: 80.0000 mg | ORAL_TABLET | Freq: Every day | ORAL | Status: DC

## 2021-10-04 MED ORDER — CARVEDILOL 12.5 MG PO TABS
12.5000 mg | ORAL_TABLET | Freq: Two times a day (BID) | ORAL | Status: DC
  Administered 2021-10-04 – 2021-10-09 (×10): 12.5 mg
  Filled 2021-10-04 (×11): qty 1

## 2021-10-04 MED ORDER — DIPHENHYDRAMINE HCL 50 MG/ML IJ SOLN
50.0000 mg | Freq: Once | INTRAMUSCULAR | Status: AC
  Administered 2021-10-05: 50 mg via INTRAVENOUS
  Filled 2021-10-04: qty 1

## 2021-10-04 MED ORDER — CHLORHEXIDINE GLUCONATE CLOTH 2 % EX PADS
6.0000 | MEDICATED_PAD | Freq: Every day | CUTANEOUS | Status: DC
  Administered 2021-10-05 – 2021-10-13 (×7): 6 via TOPICAL

## 2021-10-04 MED ORDER — SODIUM BICARBONATE 8.4 % IV SOLN
50.0000 meq | Freq: Once | INTRAVENOUS | Status: AC
  Administered 2021-10-04: 50 meq via INTRAVENOUS

## 2021-10-04 MED ORDER — LEVETIRACETAM IN NACL 1000 MG/100ML IV SOLN
1000.0000 mg | Freq: Once | INTRAVENOUS | Status: AC
  Administered 2021-10-04: 1000 mg via INTRAVENOUS
  Filled 2021-10-04: qty 100

## 2021-10-04 MED ORDER — PROPOFOL 1000 MG/100ML IV EMUL
5.0000 ug/kg/min | INTRAVENOUS | Status: DC
  Administered 2021-10-04: 5 ug/kg/min via INTRAVENOUS

## 2021-10-04 MED ORDER — DIPHENHYDRAMINE HCL 25 MG PO CAPS: 50.0000 mg | ORAL_CAPSULE | Freq: Once | ORAL | Status: AC

## 2021-10-04 MED ORDER — ETOMIDATE 2 MG/ML IV SOLN
INTRAVENOUS | Status: AC
  Filled 2021-10-04: qty 20

## 2021-10-04 MED ORDER — THIAMINE HCL 100 MG/ML IJ SOLN
250.0000 mg | Freq: Every day | INTRAVENOUS | Status: DC
  Filled 2021-10-04: qty 2.5

## 2021-10-04 MED ORDER — ORAL CARE MOUTH RINSE
15.0000 mL | OROMUCOSAL | Status: DC
  Administered 2021-10-04 – 2021-11-01 (×265): 15 mL via OROMUCOSAL

## 2021-10-04 MED ORDER — ATORVASTATIN CALCIUM 80 MG PO TABS
80.0000 mg | ORAL_TABLET | Freq: Every day | ORAL | Status: DC
  Administered 2021-10-04 – 2021-11-01 (×29): 80 mg
  Filled 2021-10-04 (×2): qty 2
  Filled 2021-10-04 (×4): qty 1
  Filled 2021-10-04 (×6): qty 2
  Filled 2021-10-04: qty 1
  Filled 2021-10-04: qty 2
  Filled 2021-10-04: qty 1
  Filled 2021-10-04 (×5): qty 2
  Filled 2021-10-04 (×5): qty 1
  Filled 2021-10-04: qty 2
  Filled 2021-10-04: qty 1
  Filled 2021-10-04 (×3): qty 2

## 2021-10-04 MED ORDER — VANCOMYCIN HCL IN DEXTROSE 1-5 GM/200ML-% IV SOLN: 1000.0000 mg | INTRAVENOUS | Status: DC

## 2021-10-04 MED ORDER — MIDAZOLAM HCL 2 MG/2ML IJ SOLN
INTRAMUSCULAR | Status: AC
  Filled 2021-10-04: qty 2

## 2021-10-04 MED ORDER — DEXTROSE 10 % IV SOLN: INTRAVENOUS | Status: DC

## 2021-10-04 MED ORDER — ACETAMINOPHEN 325 MG PO TABS
650.0000 mg | ORAL_TABLET | Freq: Four times a day (QID) | ORAL | Status: DC | PRN
  Administered 2021-10-06 – 2021-10-29 (×13): 650 mg
  Filled 2021-10-04 (×14): qty 2

## 2021-10-04 MED ORDER — FENTANYL CITRATE PF 50 MCG/ML IJ SOSY: 50.0000 ug | PREFILLED_SYRINGE | INTRAMUSCULAR | Status: DC | PRN

## 2021-10-04 MED ORDER — DEXTROSE 5 % IV SOLN: INTRAVENOUS | Status: DC

## 2021-10-04 MED ORDER — DEXTROSE 50 % IV SOLN
INTRAVENOUS | Status: AC
  Filled 2021-10-04: qty 50

## 2021-10-04 MED ORDER — NALOXONE HCL 2 MG/2ML IJ SOSY
PREFILLED_SYRINGE | INTRAMUSCULAR | Status: AC
  Filled 2021-10-04: qty 4

## 2021-10-04 MED ORDER — VANCOMYCIN HCL 1500 MG/300ML IV SOLN
1500.0000 mg | Freq: Once | INTRAVENOUS | Status: AC
  Administered 2021-10-04: 1500 mg via INTRAVENOUS
  Filled 2021-10-04: qty 300

## 2021-10-04 MED ORDER — SUCCINYLCHOLINE CHLORIDE 200 MG/10ML IV SOSY
120.0000 mg | PREFILLED_SYRINGE | Freq: Once | INTRAVENOUS | Status: AC
  Administered 2021-10-04: 120 mg via INTRAVENOUS

## 2021-10-04 MED ORDER — VANCOMYCIN HCL IN DEXTROSE 1-5 GM/200ML-% IV SOLN: 1000.0000 mg | Freq: Once | INTRAVENOUS | Status: DC

## 2021-10-04 MED ORDER — ASPIRIN 81 MG PO TBEC: 81.0000 mg | DELAYED_RELEASE_TABLET | Freq: Every day | ORAL | Status: DC

## 2021-10-04 MED ORDER — INSULIN ASPART 100 UNIT/ML IV SOLN
5.0000 [IU] | Freq: Once | INTRAVENOUS | Status: AC
  Administered 2021-10-04: 5 [IU] via INTRAVENOUS

## 2021-10-04 MED ORDER — PROPOFOL 1000 MG/100ML IV EMUL
INTRAVENOUS | Status: AC
  Filled 2021-10-04: qty 100

## 2021-10-04 MED ORDER — SODIUM CHLORIDE 0.9 % IV SOLN
2.0000 g | Freq: Once | INTRAVENOUS | Status: AC
  Administered 2021-10-04: 2 g via INTRAVENOUS
  Filled 2021-10-04: qty 2

## 2021-10-04 MED ORDER — MIDAZOLAM HCL 2 MG/2ML IJ SOLN
2.0000 mg | INTRAMUSCULAR | Status: DC | PRN
  Administered 2021-10-05 – 2021-10-12 (×4): 2 mg via INTRAVENOUS
  Filled 2021-10-04 (×4): qty 2

## 2021-10-04 MED ORDER — ETOMIDATE 2 MG/ML IV SOLN
20.0000 mg | Freq: Once | INTRAVENOUS | Status: AC
  Administered 2021-10-04: 20 mg via INTRAVENOUS

## 2021-10-04 MED ORDER — SODIUM BICARBONATE 8.4 % IV SOLN
INTRAVENOUS | Status: AC
  Filled 2021-10-04: qty 50

## 2021-10-04 MED ORDER — PANTOPRAZOLE 2 MG/ML SUSPENSION
40.0000 mg | Freq: Every day | ORAL | Status: DC
  Administered 2021-10-04 – 2021-11-01 (×29): 40 mg
  Filled 2021-10-04 (×32): qty 20

## 2021-10-04 MED ORDER — KETAMINE HCL 50 MG/5ML IJ SOSY
PREFILLED_SYRINGE | INTRAMUSCULAR | Status: AC
  Filled 2021-10-04: qty 5

## 2021-10-04 MED ORDER — SODIUM CHLORIDE 0.9 % IV SOLN: 250.0000 mL | INTRAVENOUS | Status: DC

## 2021-10-04 MED ORDER — THIAMINE HCL 100 MG/ML IJ SOLN: 100.0000 mg | Freq: Every day | INTRAMUSCULAR | Status: DC

## 2021-10-04 MED ORDER — ASPIRIN 81 MG PO CHEW
81.0000 mg | CHEWABLE_TABLET | Freq: Every day | ORAL | Status: DC
  Administered 2021-10-05 – 2021-11-01 (×28): 81 mg
  Filled 2021-10-04 (×28): qty 1

## 2021-10-04 MED ORDER — THIAMINE HCL 100 MG/ML IJ SOLN
500.0000 mg | Freq: Three times a day (TID) | INTRAVENOUS | Status: AC
  Administered 2021-10-04 – 2021-10-06 (×6): 500 mg via INTRAVENOUS
  Filled 2021-10-04 (×6): qty 5

## 2021-10-04 MED ORDER — SODIUM CHLORIDE 0.9 % IV BOLUS
2000.0000 mL | Freq: Once | INTRAVENOUS | Status: AC
  Administered 2021-10-04: 2000 mL via INTRAVENOUS

## 2021-10-04 MED ORDER — NOREPINEPHRINE 4 MG/250ML-% IV SOLN
2.0000 ug/min | INTRAVENOUS | Status: DC
  Administered 2021-10-04: 2 ug/min via INTRAVENOUS
  Filled 2021-10-04: qty 250

## 2021-10-04 MED ORDER — ROCURONIUM BROMIDE 10 MG/ML (PF) SYRINGE
PREFILLED_SYRINGE | INTRAVENOUS | Status: AC
  Filled 2021-10-04: qty 10

## 2021-10-04 MED ORDER — DEXTROSE 50 % IV SOLN
1.0000 | Freq: Once | INTRAVENOUS | Status: AC
  Administered 2021-10-04: 50 mL via INTRAVENOUS
  Filled 2021-10-04: qty 50

## 2021-10-04 MED ORDER — HYDROCORTISONE SOD SUC (PF) 250 MG IJ SOLR
200.0000 mg | Freq: Once | INTRAMUSCULAR | Status: AC
  Administered 2021-10-04: 200 mg via INTRAVENOUS
  Filled 2021-10-04: qty 200

## 2021-10-04 MED ORDER — DOCUSATE SODIUM 50 MG/5ML PO LIQD
100.0000 mg | Freq: Two times a day (BID) | ORAL | Status: DC
  Administered 2021-10-04 – 2021-10-06 (×4): 100 mg
  Filled 2021-10-04 (×6): qty 10

## 2021-10-04 MED ORDER — POLYETHYLENE GLYCOL 3350 17 G PO PACK
17.0000 g | PACK | Freq: Every day | ORAL | Status: DC
  Administered 2021-10-04 – 2021-10-06 (×3): 17 g
  Filled 2021-10-04 (×3): qty 1

## 2021-10-04 NOTE — Progress Notes (Addendum)
Pharmacy Antibiotic Note ? ?Melanie Hall is a 51 y.o. ESRD female admitted on 09/23/2021 with sepsis.  Pharmacy has been consulted for Cefepime and vancomycin dosing. ? ?Plan: ?Cefepime 2gm IV with HD (TTS) ?Vancomycin '1500mg'$  IV x 1, then 1gm q HD  ? ? ?Height: '5\' 3"'$  (160 cm) ?Weight: 83.9 kg (184 lb 15.5 oz) ?IBW/kg (Calculated) : 52.4 ? ?No data recorded. ? ?Recent Labs  ?Lab 09/14/2021 ?1021  ?WBC 11.3*  ?  ?Estimated Creatinine Clearance: 16.1 mL/min (A) (by C-G formula based on SCr of 4.25 mg/dL (H)).   ? ?Allergies  ?Allergen Reactions  ? Contrast Media [Iodinated Contrast Media] Nausea And Vomiting  ?  Treated with Benadryl & Solumedrol  ? Penicillins Other (See Comments)  ?  Don't want to take PCN due to family history   ? ? ?Microbiology results: ?3/29 Blood cx ? ? ?Thank you for allowing pharmacy to be a part of this patient?s care. ? ?Lorenso Courier, PharmD ?Clinical Pharmacist ?09/09/2021 10:46 AM ? ? ? ? ? ? ?

## 2021-10-04 NOTE — ED Notes (Signed)
7.0 NPA to left nare inserted. Patient requiring suctioning, unable to clear airway. MD called to proom for possible intubation.  ?

## 2021-10-04 NOTE — ED Provider Notes (Signed)
?[facility] ?Provider Note ? ? ?CSN: [id number] ?Arrival date & time: 09/21/2021  1004 ? ?  ? ?History ? ?Chief Complaint  ?Patient presents with  ? Loss of Consciousness  ? ? ?Melanie Hall is a 51 y.o. female. ? ?Patient has a history of coronary artery disease diabetes she is a dialysis patient and had dialysis yesterday she also has a history of ischemic cardiomyopathy.  Patient was supposed to go to the eye doctor today and someone came by to pick her up.  She would not open the door so they let themselves in and found her unconscious on the couch.  There was some question whether her left arm was shaking some ? ?The history is provided by the EMS personnel. A language interpreter was used.  ?Loss of Consciousness ?Episode history:  Single ?Most recent episode:  Today ?Duration: Unknown amount of time. ?Timing:  Constant ?Chronicity:  New ?Context: not blood draw   ?Witnessed: no   ?Relieved by:  Nothing ?Worsened by:  Nothing ?Associated symptoms: no anxiety   ?Risk factors: coronary artery disease   ? ?  ? ?Home Medications ?Prior to Admission medications   ?Medication Sig Start Date End Date Taking? Authorizing Provider  ?aspirin 81 MG EC tablet Take 1 tablet (81 mg total) by mouth daily. 01/13/20   Perlie Mayo, NP  ?atorvastatin (LIPITOR) 80 MG tablet Take 1 tablet (80 mg total) by mouth daily at 6 PM. 11/24/19   Richardson Dopp T, PA-C  ?B-D ULTRA-FINE 33 LANCETS MISC Use 2 times a day with insulin 09/20/21   Renee Rival, FNP  ?blood glucose meter kit and supplies Dispense based on patient and insurance preference. Once daily testing DX E11.9 09/25/21   Renee Rival, FNP  ?carvedilol (COREG) 12.5 MG tablet Take 1 tablet (12.5 mg total) by mouth 2 (two) times daily. 07/04/21 07/04/22  Sherren Mocha, MD  ?colestipol (COLESTID) 1 g tablet Take 2 tablets (2 g total) by mouth 2 (two) times daily. 09/20/21   Renee Rival, FNP  ?epoetin alfa (EPOGEN) 3000 UNIT/ML injection  Inject 3,000 Units into the skin every 14 (fourteen) days.  06/12/19   [provider]  ?ezetimibe (ZETIA) 10 MG tablet Take 1 tablet (10 mg total) by mouth daily. 02/17/21   Richardson Dopp T, PA-C  ?gabapentin (NEURONTIN) 300 MG capsule Take 1 capsule (300 mg total) by mouth 3 (three) times daily. 05/12/21   Kirsteins, Luanna Salk, MD  ?glucose blood test strip Use as instructed 09/25/21   Renee Rival, FNP  ?hydrALAZINE (APRESOLINE) 10 MG tablet Take 1 tablet (10 mg total) by mouth in the morning and at bedtime. 11/24/19   Richardson Dopp T, PA-C  ?insulin aspart protamine- aspart (NOVOLOG MIX 70/30) (70-30) 100 UNIT/ML injection Inject 0.1 mLs (10 Units total) into the skin 2 (two) times daily with a meal. 09/20/21   Paseda, Dewaine Conger, FNP  ?isosorbide mononitrate (IMDUR) 30 MG 24 hr tablet Take 0.5 tablets (15 mg total) by mouth daily. 01/05/20 08/17/21  Richardson Dopp T, PA-C  ?Lancets 30G MISC Once daily testing dx e11.9 09/25/21   Renee Rival, FNP  ?levothyroxine (SYNTHROID) 50 MCG tablet TAKE 1 TABLET BY MOUTH ONCE DAILY BEFORE BREAKFAST 09/11/21   Renee Rival, FNP  ?lidocaine-prilocaine (EMLA) cream Apply 1 application topically daily as needed (pain).    [provider]  ?loperamide (IMODIUM) 2 MG capsule Take 2 mg by mouth as needed for diarrhea or  loose stools. 11/23/20   [provider]  ?methocarbamol (ROBAXIN) 500 MG tablet Take 1 tablet (500 mg total) by mouth every 8 (eight) hours as needed for muscle spasms. 01/10/21   Kirsteins, Luanna Salk, MD  ?multivitamin (RENA-VIT) TABS tablet Take 1 tablet by mouth at bedtime. 08/25/19   Hoyt Koch, MD  ?nitroGLYCERIN (NITROSTAT) 0.4 MG SL tablet Place 1 tablet (0.4 mg total) under the tongue every 5 (five) minutes x 3 doses as needed for chest pain. 11/24/19   Richardson Dopp T, PA-C  ?ofloxacin (OCUFLOX) 0.3 % ophthalmic solution Place 1 drop into the left eye 4 (four) times daily. 08/08/21   [provider]   ?omeprazole (PRILOSEC) 40 MG capsule Take 1 capsule (40 mg total) by mouth daily. 02/28/21   Harvel Quale, MD  ?ondansetron (ZOFRAN ODT) 4 MG disintegrating tablet Take 1 tablet (4 mg total) by mouth every 8 (eight) hours as needed for nausea or vomiting. 06/16/20   Perlie Mayo, NP  ?polyethylene glycol-electrolytes (TRILYTE) 420 g solution Take 4,000 mLs by mouth as directed. 08/07/21   Harvel Quale, MD  ?tamsulosin (FLOMAX) 0.4 MG CAPS capsule Take 1 capsule (0.4 mg total) by mouth daily after supper. 02/18/20   Perlie Mayo, NP  ?traMADol (ULTRAM) 50 MG tablet Take 1 tablet (50 mg total) by mouth every 12 (twelve) hours as needed. . She is on Hemodialysis. 07/11/21   Bayard Hugger, NP  ?   ? ?Allergies    ?Contrast media [iodinated contrast media] and Penicillins   ? ?Review of Systems   ?Review of Systems  ?Unable to perform ROS: Mental status change  ?Cardiovascular:  Positive for syncope.  ? ?Physical Exam ?Updated Vital Signs ?BP 107/60   Pulse 88   Temp 99.2 ?F (37.3 ?C) (Rectal)   Resp 18   Ht _0  (1.6 m)   Wt 83.9 kg   LMP 12/17/2016   SpO2 100%   BMI 32.77 kg/m?  ?Physical Exam ?Vitals and nursing note reviewed.  ?Constitutional:   ?   Appearance: She is well-developed.  ?   Comments: Patient only responding to painful stimuli with moving of her upper extremity  ?HENT:  ?   Head: Normocephalic.  ?   Nose: Nose normal.  ?   Mouth/Throat:  ?   Mouth: Mucous membranes are moist.  ?Eyes:  ?   General: No scleral icterus. ?   Conjunctiva/sclera: Conjunctivae normal.  ?Neck:  ?   Thyroid: No thyromegaly.  ?Cardiovascular:  ?   Rate and Rhythm: Normal rate and regular rhythm.  ?   Heart sounds: No murmur heard. ?  No friction rub. No gallop.  ?Pulmonary:  ?   Breath sounds: No stridor. No wheezing or rales.  ?   Comments: Rales heard bilaterally ?Chest:  ?   Chest wall: No tenderness.  ?Abdominal:  ?   General: There is no distension.  ?   Tenderness: There is no  abdominal tenderness. There is no rebound.  ?Musculoskeletal:  ?   Cervical back: Neck supple.  ?   Comments: Patient does move upper extremities with painful stimuli  ?Lymphadenopathy:  ?   Cervical: No cervical adenopathy.  ?Skin: ?   Findings: No erythema or rash.  ?Neurological:  ?   Motor: No abnormal muscle tone.  ?   Coordination: Coordination normal.  ?   Comments: Patient not responding to verbal stimuli but she does move both upper extremities to painful stimuli  ? ? ?  ED Results / Procedures / Treatments   ?Labs ?(all labs ordered are listed, but only abnormal results are displayed) ?Labs Reviewed  ?CBC WITH DIFFERENTIAL/PLATELET - Abnormal; Notable for the following components:  ?    Result Value  ? WBC 11.3 (*)   ? RBC 3.27 (*)   ? Hemoglobin 11.2 (*)   ? MCV 110.4 (*)   ? MCH 34.3 (*)   ? RDW 16.4 (*)   ? Neutro Abs 9.4 (*)   ? All other components within normal limits  ?COMPREHENSIVE METABOLIC PANEL - Abnormal; Notable for the following components:  ? Potassium 6.3 (*)   ? Glucose, Bld 137 (*)   ? BUN 40 (*)   ? Creatinine, Ser 5.29 (*)   ? Calcium 8.3 (*)   ? Alkaline Phosphatase 144 (*)   ? GFR, Estimated 9 (*)   ? All other components within normal limits  ?BLOOD GAS, ARTERIAL - Abnormal; Notable for the following components:  ? pO2, Arterial 465 (*)   ? Allens test (pass/fail) BRACHIAL ARTERY (*)   ? All other components within normal limits  ?CBG MONITORING, ED - Abnormal; Notable for the following components:  ? Glucose-Capillary 120 (*)   ? All other components within normal limits  ?RESP PANEL BY RT-PCR (FLU A&B, COVID) [id number]  ?CULTURE, BLOOD (ROUTINE X 2)  ?CULTURE, BLOOD (ROUTINE X 2)  ?LACTIC ACID, PLASMA  ?LACTIC ACID, PLASMA  ?PROTIME-INR  ?APTT  ?URINALYSIS, ROUTINE W REFLEX MICROSCOPIC  ?BASIC METABOLIC PANEL  ?POTASSIUM  ?POC URINE PREG, ED  ?TROPONIN I (HIGH SENSITIVITY)  ?TROPONIN I (HIGH SENSITIVITY)  ? ? ?EKG ?EKG Interpretation ? ?Date/Time:  Wednesday October 04 2021 10:33:35  EDT ?Ventricular Rate:  94 ?PR Interval:  143 ?QRS Duration: 82 ?QT Interval:  349 ?QTC Calculation: 437 ?R Axis:   22 ?Text Interpretation: Sinus rhythm Anterior infarct, old Minimal ST depression, lateral leads Confirmed by Zam

## 2021-10-04 NOTE — Consult Note (Signed)
[place] ?Admit Date: 09/15/2021 ?09/28/2021 ?Rexene Agent ?Requesting Physician:  Tamala Julian MD ? ?Reason for Consult:  ESRD, unresponsive/AMS, hyperkalemia ?HPI:  ?25F ESRD LUE AVF TTS [facility] who was found at home earlier this morning unresponsive by a neighbor.  Some findings of rhythmic activity suggestive of seizure.  She did not respond to Narcan.  Patient brought to [facility] where encephalopathy continued, requiring intubation.  Patient transferred to [facility] for further neurological evaluation.  Patient seen in the ICU at [facility].  Family at bedside.  She had dialysis on 3/28, problems noted of ongoing significant hypervolemia.  Initial labs at [facility] with potassium of 6.3, bicarbonate of 24, BUN of 40.  Patient received medical management in the ED, repeat potassium later today is 5.4. ? ?Patient currently minimally responding to any neurological examination.  Neurology to see.  EEG being ordered. ? ?Blood pressures are fairly normal but she was quite hypertensive when she presented.  Currently on 100% FiO2,.  Propofol for sedation. ? ?PMH Incudes: ?CAD hx/o PCI ?DM2 ?History of CVA ?Hyperlipidemia ?Hypertension ? ? ?Creatinine, Ser (mg/dL)  ?Date Value  ?09/22/2021 5.29 (H)  ?09/20/2021 4.25 (H)  ?02/28/2021 5.90 (H)  ?02/16/2021 4.24 (H)  ?02/14/2021 4.31 (H)  ?11/10/2020 4.05 (H)  ?10/17/2020 7.19 (H)  ?10/17/2020 6.95 (H)  ?06/23/2020 3.50 (H)  ?06/03/2020 4.77 (H)  ?] ?ROS ?Unable to complete review of systems secondary to patient's encephalopathy ?PMH  ?Past Medical History:  ?Diagnosis Date  ? Anemia   ? Arthritis   ? CAD (coronary artery disease)   ? S/p late presenting Inf STEMI 10/2019 >> PCI: DES x 2 to RCA // Myoview 8/22: EF 54, no ischemia or infarction; low risk  ? Cataracts, bilateral   ? surgery to remove  ? CATARACTS, BILATERAL 07/02/2007  ? Qualifier: Diagnosis of  By: Isla Pence    ? Closed fracture of left femur ([place]) 10/28/2018  ?  Closed fracture of right ankle 11/06/2017  ? Diabetes mellitus   ? type 2  ? Emphysematous cystitis 08/26/2018  ? ESRD on hemodialysis ([place])   ? MWF - in [place]  ? GERD (gastroesophageal reflux disease)   ? Hyperlipidemia   ? Hypertension   ? IRREGULAR MENSES 09/14/2009  ? Qualifier: Diagnosis of  By: Hassell Done FNP, Tori Milks    ? Ischemic cardiomyopathy   ? Echocardiogram 10/19/2019: EF 40-45, inf WMA, Gr 1 DD, Lg L pl Eff // EF 54 by Myoview in 8/22  ? PARONYCHIA, RIGHT GREAT TOE 07/30/2008  ? Qualifier: Diagnosis of  By: Hassell Done FNP, Tori Milks    ? Pressure ulcer 09/10/2019  ? Right arm weakness 08/08/2019  ? STEMI involving right coronary artery ([place]) 10/18/2019  ? Stroke [facility]) 10/2019  ? SVD (spontaneous vaginal delivery)   ? x 4  ? Vaginosis 08/26/2018  ? Weakness of both lower extremities   ? ?PSH  ?Past Surgical History:  ?Procedure Laterality Date  ? A/V FISTULAGRAM Left 01/12/2020  ? Procedure: A/V FISTULAGRAM;  Surgeon: Serafina Mitchell, MD;  Location: [place] [facility];  Service: Cardiovascular;  Laterality: Left;  ? A/V FISTULAGRAM N/A 03/17/2020  ? Procedure: A/V FISTULAGRAM - Left Arm;  Surgeon: Marty Heck, MD;  Location: [place] [facility];  Service: Cardiovascular;  Laterality: N/A;  ? A/V FISTULAGRAM Left 06/23/2020  ? Procedure: A/V FISTULAGRAM;  Surgeon: Marty Heck, MD;  Location: [place] [facility];  Service: Cardiovascular;  Laterality: Left;  ?  ANKLE FUSION Left 06/01/2020  ? Procedure: LEFT ANKLE FUSION;  Surgeon: Newt Minion, MD;  Location: [place];  Service: Orthopedics;  Laterality: Left;  ? AV FISTULA PLACEMENT Left 08/17/2019  ? Procedure: LEFT BRACHIAL CEPHALIC ARTERIOVENOUS (AV) FISTULA;  Surgeon: Angelia Mould, MD;  Location: [place];  Service: Vascular;  Laterality: Left;  ? BIOPSY  02/28/2021  ? Procedure: BIOPSY;  Surgeon: Harvel Quale, MD;  [facility];  Service: Gastroenterology;;  duodenum ?gastric ?esophagus ?colon  ? COLONOSCOPY WITH  PROPOFOL N/A 02/28/2021  ? Procedure: COLONOSCOPY WITH PROPOFOL;  Surgeon: Harvel Quale, MD;  [facility];  Service: Gastroenterology;  Laterality: N/A;  11:00  ? CORONARY STENT INTERVENTION N/A 10/18/2019  ? Procedure: CORONARY STENT INTERVENTION;  Surgeon: Sherren Mocha, MD;  Location: [place] [facility];  Service: Cardiovascular;  Laterality: N/A;  ? CORONARY/GRAFT ACUTE MI REVASCULARIZATION N/A 10/18/2019  ? Procedure: Coronary/Graft Acute MI Revascularization;  Surgeon: Sherren Mocha, MD;  Location: [place] [facility];  Service: Cardiovascular;  Laterality: N/A;  ? ESOPHAGOGASTRODUODENOSCOPY (EGD) WITH PROPOFOL N/A 02/28/2021  ? Procedure: ESOPHAGOGASTRODUODENOSCOPY (EGD) WITH PROPOFOL;  Surgeon: Harvel Quale, MD;  [facility];  Service: Gastroenterology;  Laterality: N/A;  ? EYE SURGERY Bilateral   ? cataracts removed  ? FEMUR IM NAIL Left 10/28/2018  ? Procedure: RETROGRADE FEMORAL NAILING;  Surgeon: Meredith Pel, MD;  Location: [place];  Service: Orthopedics;  Laterality: Left;  ? IM NAILING FEMORAL SHAFT RETROGRADE Left 10/28/2018  ? INTRAVASCULAR ULTRASOUND/IVUS N/A 10/18/2019  ? Procedure: Intravascular Ultrasound/IVUS;  Surgeon: Sherren Mocha, MD;  Location: [place] [facility];  Service: Cardiovascular;  Laterality: N/A;  ? IR FLUORO GUIDE CV LINE RIGHT  08/11/2019  ? IR THORACENTESIS ASP PLEURAL SPACE W/IMG GUIDE  11/12/2019  ? IR US GUIDE VASC ACCESS RIGHT  08/11/2019  ? KNEE SURGERY Left   ? LEFT HEART CATH AND CORONARY ANGIOGRAPHY N/A 10/18/2019  ? Procedure: LEFT HEART CATH AND CORONARY ANGIOGRAPHY;  Surgeon: Sherren Mocha, MD;  Location: [place] [facility];  Service: Cardiovascular;  Laterality: N/A;  ? PERIPHERAL VASCULAR BALLOON ANGIOPLASTY Left 01/12/2020  ? Procedure: PERIPHERAL VASCULAR BALLOON ANGIOPLASTY;  Surgeon: Serafina Mitchell, MD;  Location: [place] [facility];  Service: Cardiovascular;  Laterality: Left;  AVF  ? PERIPHERAL VASCULAR  BALLOON ANGIOPLASTY Left 03/17/2020  ? Procedure: PERIPHERAL VASCULAR BALLOON ANGIOPLASTY;  Surgeon: Marty Heck, MD;  Location: [place] [facility];  Service: Cardiovascular;  Laterality: Left;  AVF  ? PERIPHERAL VASCULAR BALLOON ANGIOPLASTY Left 05/05/2020  ? Procedure: PERIPHERAL VASCULAR BALLOON ANGIOPLASTY;  Surgeon: Marty Heck, MD;  Location: [place] [facility];  Service: Cardiovascular;  Laterality: Left;  arm fistula  ? RADIOLOGY WITH ANESTHESIA N/A 09/15/2019  ? Procedure: [facility] AND LUMBER LOWER BACK PAIN;  Surgeon: Radiologist, Medication, MD;  Location: [place];  Service: Radiology;  Laterality: N/A;  ? TUBAL LIGATION    ? ?FH  ?Family History  ?Problem Relation Age of Onset  ? Diabetes Mother   ? Hypertension Mother   ? Bipolar disorder Mother   ? Brain cancer Maternal Aunt   ? Heart disease Maternal Grandmother   ? Diabetes Maternal Grandmother   ? Breast cancer Maternal Aunt   ? Depression Father   ?     Committed suicide  ? Diabetes Sister   ? ?SH  reports that she quit smoking about 26 years ago. Her smoking use included cigarettes. She has a  0.50 pack-year smoking history. She has never used smokeless tobacco. She reports that she does not drink alcohol and does not use drugs. ?Allergies  ?Allergies  ?Allergen Reactions  ? Contrast Media [Iodinated Contrast Media] Nausea And Vomiting  ?  Treated with Benadryl & Solumedrol  ? Penicillins Other (See Comments)  ?  Don't want to take PCN due to family history   ? ?Home medications ?Prior to Admission medications   ?Medication Sig Start Date End Date Taking? Authorizing Provider  ?aspirin 81 MG EC tablet Take 1 tablet (81 mg total) by mouth daily. 01/13/20  Yes Perlie Mayo, NP  ?carvedilol (COREG) 12.5 MG tablet Take 1 tablet (12.5 mg total) by mouth 2 (two) times daily. 07/04/21 07/04/22 Yes Sherren Mocha, MD  ?colestipol (COLESTID) 1 g tablet Take 2 tablets (2 g total) by mouth 2 (two) times daily. 09/20/21  Yes Paseda, Dewaine Conger,  FNP  ?gabapentin (NEURONTIN) 300 MG capsule Take 1 capsule (300 mg total) by mouth 3 (three) times daily. 05/12/21  Yes Kirsteins, Luanna Salk, MD  ?insulin aspart protamine- aspart (NOVOLOG MIX 70/30) (70-30) 100

## 2021-10-04 NOTE — ED Notes (Signed)
Patient intubated at this time. 23 at lip, 7.5 ET tube. +color change, + breath sounds bilaterally.  ?

## 2021-10-04 NOTE — ED Triage Notes (Signed)
Patient to ED via EMS after being called out for seizure like activity. Patient arrives unresponsive with snoring respirations. CBG was 75 on EMS arrival, family concerned that patient may have taken too much insulin this am. Is a dialysis patient with last treatment yesterday. CBG 120 on arrival after EMS administered Dextrose.  ?

## 2021-10-04 NOTE — ED Notes (Signed)
Per Dr. Roderic Palau, use any extremity to obtain IV access.  ?

## 2021-10-04 NOTE — ED Notes (Signed)
RT to bedside to intubate at this time.  ?

## 2021-10-04 NOTE — Plan of Care (Signed)
Paged by Dr. Roderic Palau.  Patient with ESRD on HD got HD yesterday and presented today unresponsive, now intubated.  She is to be transferred to [facility] ICU and per the ER MD they will have a bed there within an hour.  She got insulin, glucose, bicarb here.  Will recheck potassium.  ? ?Spoke with outpatient HD RN.  She is noncompliant and had a lot of fluid on yesterday - had 10 kg on yesterday.  ? ? ?Outpatient HD orders:  ? ?[facility]  ?TTS ? ?LUE AVF ?Time 3 hours and 45 min ?Bath 2K/2.5 Ca  ?BF 400  ?DF 500  ?EDW 75.5 kg ?Usually can pull 5.5 kg  ?Some hypotension with treatments  ?Last weight: 81 kg after tx on 10/03/21 ? ?Meds: calcitriol 0.25 mcg po each tx; venofer 50 mg on tuesdays and mircera 200 mcg every 2 weeks (last given on 09/30/21  ? ?FYI Her brace is 2.0 kg ?

## 2021-10-04 NOTE — Progress Notes (Signed)
Right lower extremity IO removed per IV team consult, no complications noted.  ?

## 2021-10-04 NOTE — ED Notes (Signed)
[facility] to transport patient at this time.  ?

## 2021-10-04 NOTE — Procedures (Signed)
Patient Name: Melanie Hall  ?MRN: [id number]  ?Epilepsy Attending: Lora Havens  ?Referring Physician/Provider: Derek Jack, MD ?Date: 09/22/2021 ?Duration: 25.50 mins ? ?Patient history: 51yo F with ams. EEG to evaluate for seizure ? ?Level of alertness:  comatose ? ?AEDs during EEG study: Propofol ? ?Technical aspects: This EEG study was done with scalp electrodes positioned according to the 10-20 International system of electrode placement. Electrical activity was acquired at a sampling rate of '500Hz'$  and reviewed with a high frequency filter of '70Hz'$  and a low frequency filter of '1Hz'$ . EEG data were recorded continuously and digitally stored.  ? ?Description: EEG showed burst suppression pattern with burst of high amplitude polymorphic sharply contoured discharges ( left<right) lasting 2-5 seconds alternating with 3-7 seconds of generalized eeg suppression.  ?Hyperventilation and photic stimulation were not performed.    ? ?Of note, study was technically difficult due to significant myogenic artifact. ? ?ABNORMALITY ?- Burst suppression, generalized and lateralized left  ? ?IMPRESSION: ?This technically difficult study is suggestive of cortical dysfunction arising from left hemisphere, likely secondary to underlying structural abnormality as well as profound diffuse encephalopathy, nonspecific etiology. No seizures or definite epileptiform discharges were seen throughout the recording. ? ?Dr Quinn Axe was notified.  ? ? ?[name]  ? ?

## 2021-10-04 NOTE — Consult Note (Signed)
Neurology Consultation ? ?Reason for Consult: extremity twitching ?Referring Physician: Dr. Tamala Julian  ? ?CC: AMS ? ?History is obtained from:medical chart, and daughters at the bedside  ? ?HPI: Melanie Hall is a 51 y.o. female with past medical history of ESRD on HD TTS, CHF, CAD, HTN, HLD, CAD, GERD, DM, hypothyroidism, CVA who was found down by neighbor unresponsive with some arm shaking which lasted about 1 minute. EMS called. She received narcan with no response. BP was 215/96. She was brought to [facility] and intubated for airway protection, received 1024m [facility]ra. [facility] ? ?Her 2 daughters are at the bedside, and state she had a similar episode last Monday where she became unresponsive, EMS was called out and by the time EMS arrived the episode resolved and she refused to go to hospital to be evaluated. They state she had HD yesterday. The last time one of them spoke to her was this past Monday.  ? ? ?ROS: Unable to obtain due to altered mental status.  ? ?Past Medical History:  ?Diagnosis Date  ? Anemia   ? Arthritis   ? CAD (coronary artery disease)   ? S/p late presenting Inf STEMI 10/2019 >> PCI: DES x 2 to RCA // Myoview 8/22: EF 54, no ischemia or infarction; low risk  ? Cataracts, bilateral   ? surgery to remove  ? CATARACTS, BILATERAL 07/02/2007  ? Qualifier: Diagnosis of  By: CIsla Pence   ? [place]  ? Closed fracture of right ankle 11/06/2017  ? Diabetes mellitus   ? type 2  ? Emphysematous cystitis 08/26/2018  ? [place]   [place]  ? GERD (gastroesophageal reflux disease)   ? Hyperlipidemia   ? Hypertension   ? IRREGULAR MENSES 09/14/2009  ? Qualifier: Diagnosis of  By: MHassell DoneFNP, NTori Milks   ? Ischemic cardiomyopathy   ? Echocardiogram 10/19/2019: EF 40-45, inf WMA, Gr 1 DD, Lg L pl Eff // EF 54 by Myoview in 8/22  ? PARONYCHIA, RIGHT GREAT TOE 07/30/2008  ? Qualifier: Diagnosis of  By:  MHassell DoneFNP, NTori Milks   ? Pressure ulcer 09/10/2019  ? Right arm weakness 08/08/2019  ? STEMI involving right coronary artery ([place] 10/18/2019  ? Stroke ([facility] 10/2019  ? SVD (spontaneous vaginal delivery)   ? x 4  ? Vaginosis 08/26/2018  ? Weakness of both lower extremities   ? ? ? ? ? ?Family History  ?Problem Relation Age of Onset  ? Diabetes Mother   ? Hypertension Mother   ? Bipolar disorder Mother   ? Brain cancer Maternal Aunt   ? Heart disease Maternal Grandmother   ? Diabetes Maternal Grandmother   ? Breast cancer Maternal Aunt   ? Depression Father   ?     Committed suicide  ? Diabetes Sister   ? ? ? ?Social History:  ? reports that she quit smoking about 26 years ago. Her smoking use included cigarettes. She has a 0.50 pack-year smoking history. She has never used smokeless tobacco. She reports that she does not drink alcohol and does not use drugs. ? ?Medications ? ?Current Facility-Administered Medications:  ?  0.9 %  sodium chloride infusion, , Intravenous, Continuous, Sood, Vineet, MD ?  0.9 %  sodium chloride infusion, 250 mL, Intravenous, Continuous, NGaylan Gerold DO ?  acetaminophen (TYLENOL) tablet 650 mg, 650 mg, Per Tube, Q6H PRN, SChesley Mires MD ?  aspirin chewable tablet 81 mg, 81 mg, Per Tube, Daily, Chesley Mires, MD ?  atorvastatin (LIPITOR) tablet 80 mg, 80 mg, Per Tube, q1800, Chesley Mires, MD ?  carvedilol (COREG) tablet 12.5 mg, 12.5 mg, Per Tube, BID WC, Chesley Mires, MD ?  [START ON 10/06/2021] ceFEPIme (MAXIPIME) 2 g in sodium chloride 0.9 % 100 mL IVPB, 2 g, Intravenous, Q M,W,F-HD, Madueme, Elvira C, RPH ?  chlorhexidine gluconate (MEDLINE KIT) (PERIDEX) 0.12 % solution 15 mL, 15 mL, Mouth Rinse, BID, Halford Chessman, Vineet, MD, 15 mL at 09/21/2021 1534 ?  dextrose 10 % infusion, , Intravenous, Continuous, Gaylan Gerold, DO ?  docusate (COLACE) 50 MG/5ML liquid 100 mg, 100 mg, Per Tube, BID, Chesley Mires, MD ?  etomidate (AMIDATE) 2 MG/ML injection, , , ,  ?  [START ON 10/05/2021] ezetimibe  (ZETIA) tablet 10 mg, 10 mg, Per Tube, Daily, Sood, Vineet, MD ?  fentaNYL (SUBLIMAZE) 50 MCG/ML injection, , , ,  ?  fentaNYL (SUBLIMAZE) injection 50-200 mcg, 50-200 mcg, Intravenous, Q30 min PRN, Chesley Mires, MD ?  heparin injection 5,000 Units, 5,000 Units, Subcutaneous, Q8H, Chesley Mires, MD, 5,000 Units at 09/19/2021 1538 ?  insulin aspart (novoLOG) injection 0-6 Units, 0-6 Units, Subcutaneous, Q4H, Chesley Mires, MD ?  Derrill Memo ON 10/05/2021] levothyroxine (SYNTHROID) tablet 50 mcg, 50 mcg, Per Tube, QAC breakfast, Sood, Vineet, MD ?  MEDLINE mouth rinse, 15 mL, Mouth Rinse, 10 times per day, Chesley Mires, MD ?  midazolam (VERSED) injection 2 mg, 2 mg, Intravenous, Q2H PRN, Chesley Mires, MD ?  norepinephrine (LEVOPHED) 48m in 2570m(0.016 mg/mL) premix infusion, 2-10 mcg/min, Intravenous, Titrated, NgGaylan GeroldDO, Held at 09/11/2021 1613 ?  pantoprazole sodium (PROTONIX) 40 mg/20 mL oral suspension 40 mg, 40 mg, Per Tube, Daily, Sood, Vineet, MD ?  polyethylene glycol (MIRALAX / GLYCOLAX) packet 17 g, 17 g, Per Tube, Daily, Sood, Vineet, MD ?  propofol (DIPRIVAN) 1000 MG/100ML infusion, 0-50 mcg/kg/min, Intravenous, Continuous, Sood, Vineet, MD, Last Rate: 12.59 mL/hr at 09/12/2021 1338, 25 mcg/kg/min at 10/01/2021 1338 ?  sodium bicarbonate 1 mEq/mL injection, , , ,  ?  succinylcholine (ANECTINE) 200 MG/10ML syringe, , , ,  ?  [START ON 10/06/2021] vancomycin (VANCOCIN) IVPB 1000 mg/200 mL premix, 1,000 mg, Intravenous, Q M,W,F-HD, Madueme, Elvira C, RPH ? ? ?Exam: ?Current vital signs: ?BP (!) 92/42 (BP Location: Right Arm)   Pulse 73   Temp 97.7 ?F (36.5 ?C) (Oral)   Resp 16   Ht _0  (1.6 m)   Wt 83.9 kg   LMP 12/17/2016   SpO2 100%   BMI 32.77 kg/m?  ?Vital signs in last 24 hours: ?Temp:  [97.7 ?F (36.5 ?C)-99.2 ?F (37.3 ?C)] 97.7 ?F (36.5 ?C) (03/29 1550) ?Pulse Rate:  [73-104] 73 (03/29 1530) ?Resp:  [14-27] 16 (03/29 1530) ?BP: (92-215)/(42-106) 92/42 (03/29 1530) ?SpO2:  [100 %] 100 % (03/29  1530) ?FiO2 (%):  [60 %-100 %] 100 % (03/29 1530) ?Weight:  [83.9 kg] 83.9 kg (03/29 1009) ? ?GENERAL: sedated on propofol @ 4042m ?HEENT: - Normocephalic and atraumatic, dry mm. Et tube, NG tube  ?LUNGS - Clear to auscultation bilaterally with no wheezes ?CV - S1S2 RRR, no m/r/g, equal pulses bilaterally. ?ABDOMEN - Soft, nontender, nondistended with normoactive BS ?Ext: Left upper arm fistula, warm, well perfused, intact peripheral pulses, no edema ? ?Exam was started on 42m44mf prop and decreased to 10mg46m exam ? ?NEURO:  ?Mental Status: intubated and sedated. No response to voice or painful  stimuli. Eyes closed, does not open them ?CN ?Speech: Unable to assess ?Visual: no blink to threat ?Pupils: 2 non-reactive bilaterally  ?EOM: intact ?Corneal Reflex: absent bilaterally ?Face: unable to assess ?Motor: extensor posturing on bilateral uppers, no response to painful stimuli on bilateral lowers ?Tone: is normal and bulk is normal ?Sensation- as above  ?Coordination: unable to assess ?Gait- deferred ? ? ?Imaging ?I have reviewed the images obtained: ? ?CT-head 3/29: ?No acute abnormality  ? ?Assessment:  ?Melanie Hall is a 51 y.o. female with past medical history of ESRD on HD TTS, CAD, HTN, HLD, CAD, GERD, DM, CVA who was found down by neighbor unresponsive with some arm shaking which lasted about 1 minute. EMS called. She received narcan with no response. BP was 215/96. She was brought to [facility] and intubated for airway protection, received 1047m Keppra ? ?Recommendations: ?-Seizure precautions ?-LTM to r/o seizure. Please call neurology if any seizure like activity seen ?-Continue Keppra 5051mBID ?-MRI brain when able  ?-limit sedation as able  ?-Further recommendations will depend on the results of above tests ? ?DeBeulah GandyNP, ACNPC-AG ? ? ?

## 2021-10-04 NOTE — Sepsis Progress Note (Signed)
[facility] is following this code sepsis ?

## 2021-10-04 NOTE — Progress Notes (Signed)
EEG complete - results pending 

## 2021-10-04 NOTE — ED Notes (Signed)
Repaged Sood Dr Roderic Palau ?

## 2021-10-04 NOTE — Progress Notes (Signed)
PIV consult for vasopressor: RUE assessed with ultrasound.No suitable vein found. LUE restricted due to dialysis graft. RN made aware. ?

## 2021-10-04 NOTE — H&P (Addendum)
? ?NAME:  Melanie Hall, MRN:  [id number], DOB:  01/09/1971, LOS: 0 ?ADMISSION DATE:  10/05/2021, CONSULTATION DATE:  09/28/2021 ?REFERRING MD:  Dr. Roderic Palau, ER, CHIEF COMPLAINT:  AMS  ? ?History of Present Illness:  ?51 yo female former smoker with hx of ESRD on HD was found at her home by patient transport driver unresponsive.  Pt's neighbor noted that she was tapping her hands for about a minute, and then stopped.  She has snoring and drooling.  No improvement after narcan.  CBG normal.  Noted to have BP 215/96.  Intubated in [facility] for airway protection, given keppra load, started on diprivan, and started on antibiotics.  CT head negative for acute findings.  Treated for hyperkalemia.  Pt's daughter report she had similar episode on 09/24/21 and EMS was called to the house, but episode resolve and patient declined hospital evaluation.  No prior history of seizures or known head injury.  ER team d/w case with neurology and advised to transfer to [facility] for EEG monitoring and further neurology assessment. ? ?Hx from medical team, patients daughter at bedside, and medical records. ? ?Pertinent  Medical History  ?CHF, Anemia, OA, CAD, DM type 2, ESRD on HD, HLD, HTN, CVA ? ?Significant Hospital Events: ?Including procedures, antibiotic start and stop dates in addition to other pertinent events   ?3/29 present to [facility], intubated, transfer to [facility] ? ?Interim History / Subjective:  ?She is in ER, sedated, on vent. ? ?Objective   ?Blood pressure (!) 120/54, pulse 96, temperature 99.2 ?F (37.3 ?C), temperature source Rectal, resp. rate (!) 21, height '5\' 3"'$  (1.6 m), weight 83.9 kg, last menstrual period 12/17/2016, SpO2 100 %. ?   ?Vent Mode: PRVC ?FiO2 (%):  [60 %] 60 % ?Set Rate:  [18 bmp] 18 bmp ?Vt Set:  [420 mL] 420 mL ?PEEP:  [5 cmH20] 5 cmH20 ?Plateau Pressure:  [17 cmH20] 17 cmH20  ? ?Intake/Output Summary (Last 24 hours) at 09/28/2021 1331 ?Last data filed at 09/11/2021 1300 ?Gross per 24 hour  ?Intake 2288.86 ml  ?Output --   ?Net 2288.86 ml  ? ?Filed Weights  ? 09/17/2021 1009  ?Weight: 83.9 kg  ? ? ?Examination: ? ?General - sedated ?Eyes - pupils midpoint and reactive ?ENT - ETT in place ?Cardiac - regular rate/rhythm, no murmur ?Chest - equal breath sounds b/l, no wheezing or rales ?Abdomen - soft, non tender, + bowel sounds ?Extremities - AV fistula Lt arm, no cyanosis, clubbing, or edema ?Skin - no rashes ?Neuro - RASS -3 ? ?Resolved Hospital Problem list   ? ? ?Assessment & Plan:  ? ?Altered mental status. ?- concern for seizure ?- noted to have significantly elevated BP on admission, so could have PRES ?- received keppra in ER at [facility] ?- continue diprivan for RASS goal -2 to -3 ?- arranging for transfer to [facility] for continuous EEG monitoring ?- will need to notify neurology team on her arrival to [facility] ?- defer further neuro imaging to neurology ?- hold outpt neurontin, robaxin, tramadol ? ?Compromised airway. ?- full vent support ?- f/u CXR ?- goal SpO2 > 92% ? ?ESRD on iHD. ?Hyperkalemia. ?- f/u BMET ?- will need nephrology consult when she arrives to [facility] ? ?Hx of HTN, systolic CHF, CAD, HLD. ?- continue ASA, lipitor, coreg, zetia ?- hold outpt hydralazine, imdur for now ?- goal SBP < 160 ? ?DM type 2 poorly controlled with hyperglycemia. ?- SSI ? ?Hx of hypothyroidism. ?- continue synthroid ? ??sepsis. ?- seems less  likely ?- no obvious source ?- continue ABx for now, but d/c soon if culture results negative ? ?Best Practice (right click and "Reselect all SmartList Selections" daily)  ? ?Diet/type: NPO ?DVT prophylaxis: prophylactic heparin  ?GI prophylaxis: PPI ?Lines: N/A ?Foley:  N/A ?Code Status:  full code ?Last date of multidisciplinary goals of care discussion [updated pt's daughter at bedside] ? ?Labs   ?CBC: ?Recent Labs  ?Lab 09/16/2021 ?1021  ?WBC 11.3*  ?NEUTROABS 9.4*  ?HGB 11.2*  ?HCT 36.1  ?MCV 110.4*  ?PLT 229  ? ? ?Basic Metabolic Panel: ?Recent Labs  ?Lab 09/27/2021 ?1021  ?NA 136  ?K 6.3*  ?CL 102  ?CO2 24  ?GLUCOSE  137*  ?BUN 40*  ?CREATININE 5.29*  ?CALCIUM 8.3*  ? ?GFR: ?Estimated Creatinine Clearance: 12.9 mL/min (A) (by C-G formula based on SCr of 5.29 mg/dL (H)). ?Recent Labs  ?Lab 09/10/2021 ?1021 09/16/2021 ?1235  ?WBC 11.3*  --   ?LATICACIDVEN 1.5 1.2  ? ? ?Liver Function Tests: ?Recent Labs  ?Lab 09/07/2021 ?1021  ?AST 19  ?ALT 12  ?ALKPHOS 144*  ?BILITOT 0.9  ?PROT 7.6  ?ALBUMIN 3.6  ? ?No results for input(s): LIPASE, AMYLASE in the last 168 hours. ?No results for input(s): AMMONIA in the last 168 hours. ? ?ABG ?   ?Component Value Date/Time  ? PHART 7.42 10/02/2021 1128  ? PCO2ART 36 10/06/2021 1128  ? PO2ART 465 (H) 10/05/2021 1128  ? HCO3 23.2 09/09/2021 1128  ? TCO2 28 02/28/2021 0928  ? ACIDBASEDEF 0.8 09/24/2021 1128  ? O2SAT 99.4 09/24/2021 1128  ?  ? ?Coagulation Profile: ?Recent Labs  ?Lab 09/10/2021 ?1021  ?INR 1.1  ? ? ?Cardiac Enzymes: ?No results for input(s): CKTOTAL, CKMB, CKMBINDEX, TROPONINI in the last 168 hours. ? ?HbA1C: ?Hgb A1c MFr Bld  ?Date/Time Value Ref Range Status  ?09/20/2021 09:18 AM 10.5 (H) 4.8 - 5.6 % Final  ?  Comment:  ?           Prediabetes: 5.7 - 6.4 ?         Diabetes: >6.4 ?         Glycemic control for adults with diabetes: <7.0 ?  ?02/14/2021 08:58 AM 8.7 (H) 4.8 - 5.6 % Final  ?  Comment:  ?           Prediabetes: 5.7 - 6.4 ?         Diabetes: >6.4 ?         Glycemic control for adults with diabetes: <7.0 ?  ? ? ?CBG: ?Recent Labs  ?Lab 09/21/2021 ?1006  ?GLUCAP 120*  ? ? ?Review of Systems:   ?Unable to obtain. ? ?Past Medical History:  ?She,  has a past medical history of Anemia, Arthritis, CAD (coronary artery disease), Cataracts, bilateral, CATARACTS, BILATERAL (07/02/2007), Closed fracture of left femur ([place]) (10/28/2018), Closed fracture of right ankle (11/06/2017), Diabetes mellitus, Emphysematous cystitis (08/26/2018), ESRD on hemodialysis ([place]), GERD (gastroesophageal reflux disease), Hyperlipidemia, Hypertension, IRREGULAR MENSES (09/14/2009), Ischemic cardiomyopathy,  PARONYCHIA, RIGHT GREAT TOE (07/30/2008), Pressure ulcer (09/10/2019), Right arm weakness (08/08/2019), STEMI involving right coronary artery ([place]) (10/18/2019), Stroke ([place]) (10/2019), SVD (spontaneous vaginal delivery), Vaginosis (08/26/2018), and Weakness of both lower extremities.  ? ?Surgical History:  ? ?Past Surgical History:  ?Procedure Laterality Date  ? A/V FISTULAGRAM Left 01/12/2020  ? Procedure: A/V FISTULAGRAM;  Surgeon: Serafina Mitchell, MD;  Location: [place] [facility];  Service: Cardiovascular;  Laterality: Left;  ? A/V FISTULAGRAM N/A 03/17/2020  ? Procedure:  A/V FISTULAGRAM - Left Arm;  Surgeon: Marty Heck, MD;  Location: [place] [facility];  Service: Cardiovascular;  Laterality: N/A;  ? A/V FISTULAGRAM Left 06/23/2020  ? Procedure: A/V FISTULAGRAM;  Surgeon: Marty Heck, MD;  Location: [place] [facility];  Service: Cardiovascular;  Laterality: Left;  ? ANKLE FUSION Left 06/01/2020  ? Procedure: LEFT ANKLE FUSION;  Surgeon: Newt Minion, MD;  Location: [place];  Service: Orthopedics;  Laterality: Left;  ? AV FISTULA PLACEMENT Left 08/17/2019  ? Procedure: LEFT BRACHIAL CEPHALIC ARTERIOVENOUS (AV) FISTULA;  Surgeon: Angelia Mould, MD;  Location: [place];  Service: Vascular;  Laterality: Left;  ? BIOPSY  02/28/2021  ? Procedure: BIOPSY;  Surgeon: Harvel Quale, MD;  [facility];  Service: Gastroenterology;;  duodenum ?gastric ?esophagus ?colon  ? COLONOSCOPY WITH PROPOFOL N/A 02/28/2021  ? Procedure: COLONOSCOPY WITH PROPOFOL;  Surgeon: Harvel Quale, MD;  [facility];  Service: Gastroenterology;  Laterality: N/A;  11:00  ? CORONARY STENT INTERVENTION N/A 10/18/2019  ? Procedure: CORONARY STENT INTERVENTION;  Surgeon: Sherren Mocha, MD;  Location: [place] [facility];  Service: Cardiovascular;  Laterality: N/A;  ? CORONARY/GRAFT ACUTE MI REVASCULARIZATION N/A 10/18/2019  ? Procedure: Coronary/Graft Acute MI Revascularization;   Surgeon: Sherren Mocha, MD;  Location: [place] [facility];  Service: Cardiovascular;  Laterality: N/A;  ? ESOPHAGOGASTRODUODENOSCOPY (EGD) WITH PROPOFOL N/A 02/28/2021  ? Procedure: ESOPHAGOGASTRODUODENOSCOPY (EGD)

## 2021-10-05 ENCOUNTER — Inpatient Hospital Stay (HOSPITAL_COMMUNITY): Payer: Medicaid Other

## 2021-10-05 DIAGNOSIS — R569 Unspecified convulsions: Secondary | ICD-10-CM

## 2021-10-05 DIAGNOSIS — L8990 Pressure ulcer of unspecified site, unspecified stage: Secondary | ICD-10-CM | POA: Insufficient documentation

## 2021-10-05 DIAGNOSIS — R4020 Unspecified coma: Secondary | ICD-10-CM

## 2021-10-05 DIAGNOSIS — R4182 Altered mental status, unspecified: Secondary | ICD-10-CM | POA: Diagnosis not present

## 2021-10-05 DIAGNOSIS — I6783 Posterior reversible encephalopathy syndrome: Secondary | ICD-10-CM

## 2021-10-05 LAB — PHOSPHORUS: Phosphorus: 6.7 mg/dL — ABNORMAL HIGH (ref 2.5–4.6)

## 2021-10-05 LAB — CBC
HCT: 30.7 % — ABNORMAL LOW (ref 36.0–46.0)
Hemoglobin: 10.2 g/dL — ABNORMAL LOW (ref 12.0–15.0)
MCH: 34.9 pg — ABNORMAL HIGH (ref 26.0–34.0)
MCHC: 33.2 g/dL (ref 30.0–36.0)
MCV: 105.1 fL — ABNORMAL HIGH (ref 80.0–100.0)
Platelets: 199 10*3/uL (ref 150–400)
RBC: 2.92 MIL/uL — ABNORMAL LOW (ref 3.87–5.11)
RDW: 16.6 % — ABNORMAL HIGH (ref 11.5–15.5)
WBC: 7.7 10*3/uL (ref 4.0–10.5)
nRBC: 0 % (ref 0.0–0.2)

## 2021-10-05 LAB — GLUCOSE, CAPILLARY
Glucose-Capillary: 180 mg/dL — ABNORMAL HIGH (ref 70–99)
Glucose-Capillary: 306 mg/dL — ABNORMAL HIGH (ref 70–99)
Glucose-Capillary: 201 mg/dL — ABNORMAL HIGH (ref 70–99)
Glucose-Capillary: 121 mg/dL — ABNORMAL HIGH (ref 70–99)
Glucose-Capillary: 96 mg/dL (ref 70–99)
Glucose-Capillary: 137 mg/dL — ABNORMAL HIGH (ref 70–99)

## 2021-10-05 LAB — BASIC METABOLIC PANEL
Anion gap: 14 (ref 5–15)
BUN: 41 mg/dL — ABNORMAL HIGH (ref 6–20)
CO2: 22 mmol/L (ref 22–32)
Calcium: 8.2 mg/dL — ABNORMAL LOW (ref 8.9–10.3)
Chloride: 100 mmol/L (ref 98–111)
Creatinine, Ser: 5.87 mg/dL — ABNORMAL HIGH (ref 0.44–1.00)
GFR, Estimated: 8 mL/min — ABNORMAL LOW (ref >60)
Glucose, Bld: 180 mg/dL — ABNORMAL HIGH (ref 70–99)
Potassium: 5.7 mmol/L — ABNORMAL HIGH (ref 3.5–5.1)
Sodium: 136 mmol/L (ref 135–145)

## 2021-10-05 LAB — TRIGLYCERIDES: Triglycerides: 158 mg/dL — ABNORMAL HIGH (ref <150)

## 2021-10-05 LAB — POTASSIUM
Potassium: 6.5 mmol/L (ref 3.5–5.1)
Potassium: 6.1 mmol/L — ABNORMAL HIGH (ref 3.5–5.1)
Potassium: 5.2 mmol/L — ABNORMAL HIGH (ref 3.5–5.1)

## 2021-10-05 LAB — HEPATITIS B SURFACE ANTIBODY,QUALITATIVE: Hep B S Ab: NONREACTIVE

## 2021-10-05 LAB — HEPATITIS B SURFACE ANTIGEN: Hepatitis B Surface Ag: NONREACTIVE

## 2021-10-05 LAB — MAGNESIUM: Magnesium: 1.7 mg/dL (ref 1.7–2.4)

## 2021-10-05 LAB — RPR: RPR Ser Ql: NONREACTIVE

## 2021-10-05 IMAGING — DX DG CHEST 1V PORT
1 series · 1 of 1 positions shown · non-contrast
Comparison: [DATE].

CLINICAL DATA: Respiratory failure.

EXAM:
PORTABLE CHEST 1 VIEW

[chest ap]
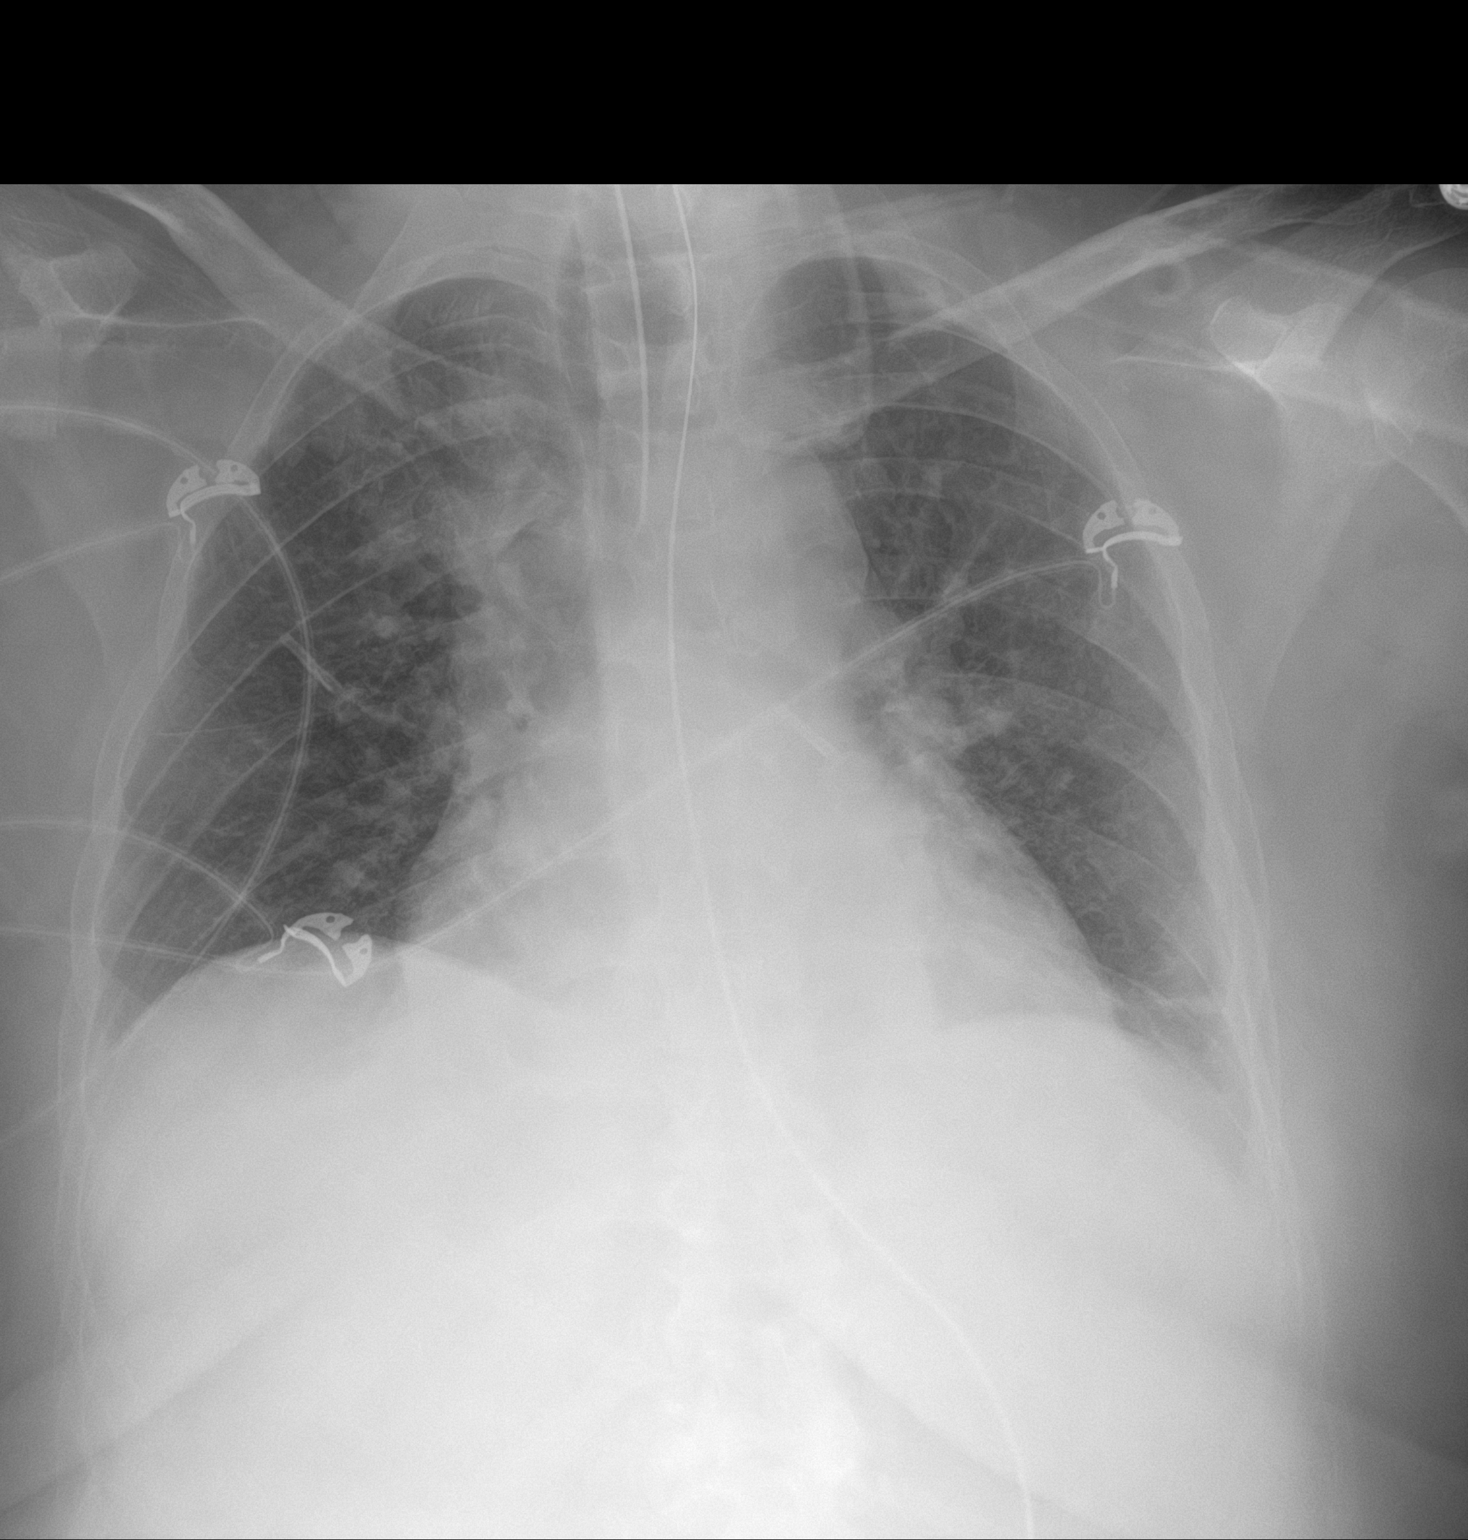

[1 of 1 positions shown; findings below may reference images not displayed]

FINDINGS: Stable cardiomediastinal silhouette. Endotracheal and nasogastric
tubes are unchanged in position. Right lung is clear. Minimal left
basilar subsegmental atelectasis is noted. Bony thorax is
unremarkable.
IMPRESSION: Stable support apparatus. Minimal left basilar subsegmental
atelectasis.

## 2021-10-05 IMAGING — MR MR HEAD W/O CM
12 of 13 series · 44 of 48 positions shown · non-contrast
Comparison: CTA head and CT of the head [DATE].

CLINICAL DATA: Mental status change.  Patient found unresponsive.

EXAM:
MRI HEAD WITHOUT CONTRAST
TECHNIQUE: Multiplanar, multiecho pulse sequences of the brain and surrounding
structures were obtained without intravenous contrast.

[Series 5: DWI · axial · 3.0mm · 0.88mm/px · z∈[-114,+23]mm · 8 of 96 slices shown (1 of 4)]
[im 1/96]
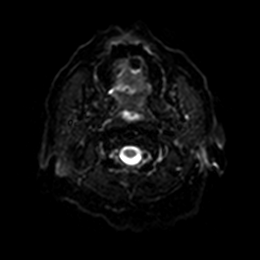
[im 14/96]
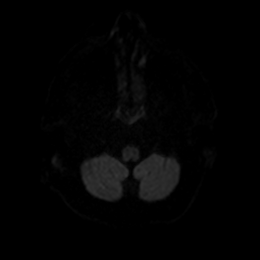
[im 28/96]
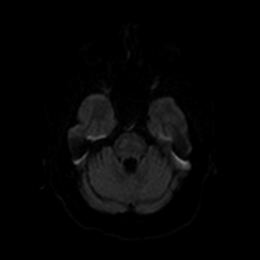
[im 41/96]
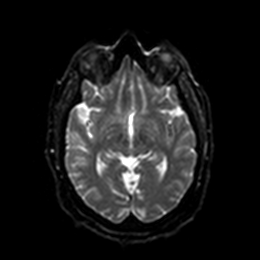
[im 55/96]
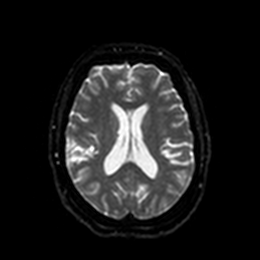
[im 68/96]
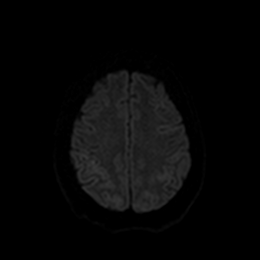
[im 82/96]
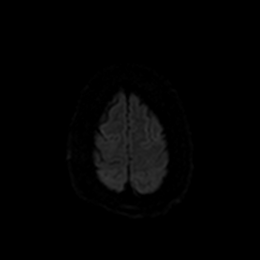
[im 96/96]
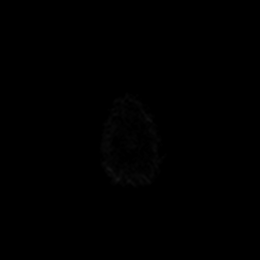

[Series 6: DWI · axial · 3.0mm · 0.88mm/px · z∈[-114,+23]mm · 4 of 48 slices shown (2 of 4)]
[im 1/48]
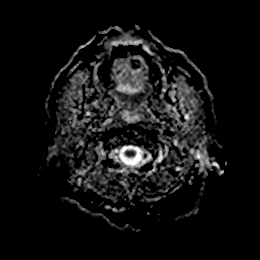
[im 16/48]
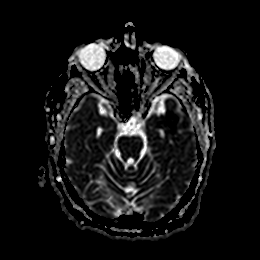
[im 32/48]
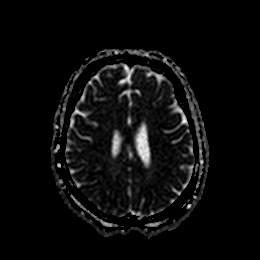
[im 48/48]
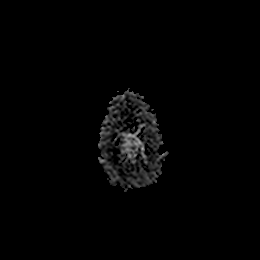

[Series 7: DWI · coronal · 4.0mm · 0.88mm/px · 5 of 64 slices shown (3 of 4)]
[im 1/64]
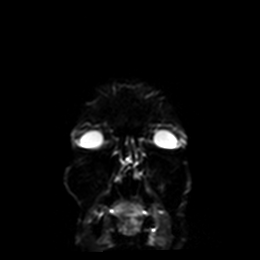
[im 16/64]
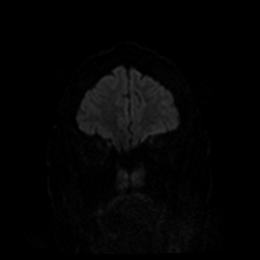
[im 32/64]
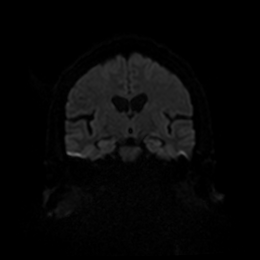
[im 48/64]
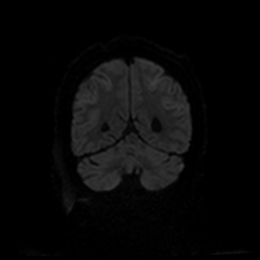
[im 64/64]
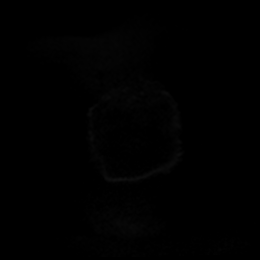

[Series 8: DWI · coronal · 4.0mm · 0.88mm/px · 3 of 32 slices shown (4 of 4)]
[im 1/32]
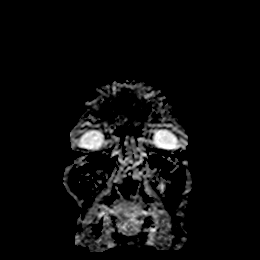
[im 16/32]
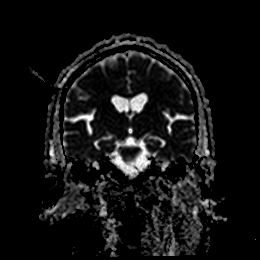
[im 32/32]
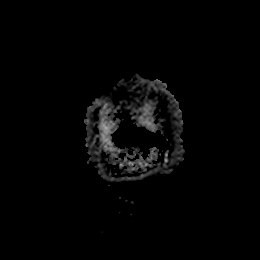

[Series 9: T1 · sagittal · 5.0mm · 0.75mm/px · 2 of 23 slices shown]
[im 1/23]
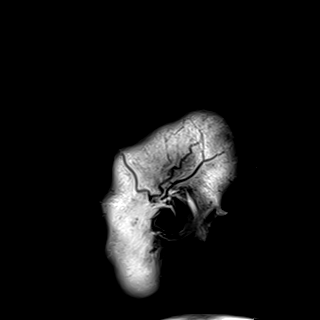
[im 23/23]
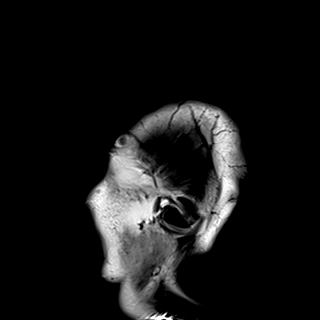

[Series 10: T2 · axial · 5.0mm · 0.72mm/px · z∈[-115,+25]mm · 2 of 25 slices shown (1 of 2)]
[im 1/25]
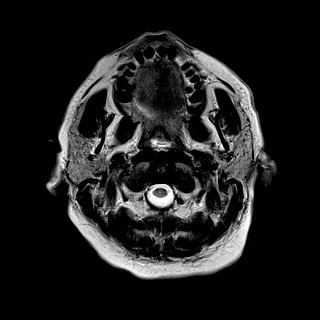
[im 25/25]
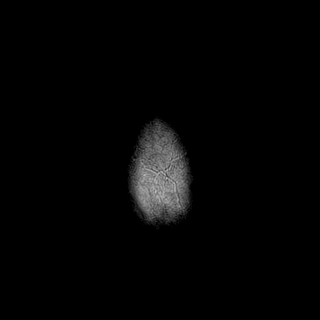

[Series 11: FLAIR · axial · 5.0mm · 0.45mm/px · z∈[-117,+23]mm · 2 of 25 slices shown]
[im 1/25]
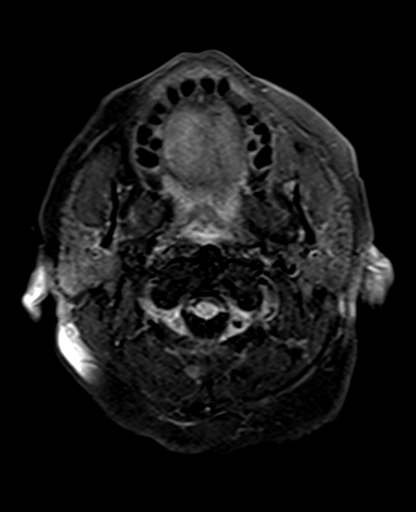
[im 25/25]
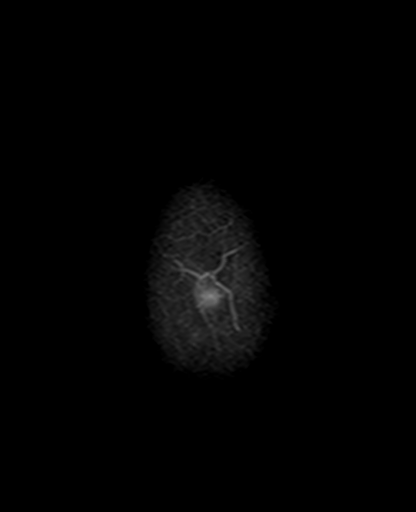

[Series 12: mag_images · axial · 3.0mm · 0.90mm/px · z∈[-122,+27]mm · 4 of 52 slices shown]
[im 1/52]
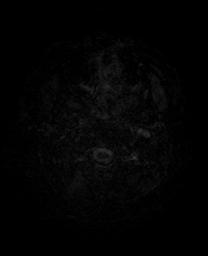
[im 18/52]
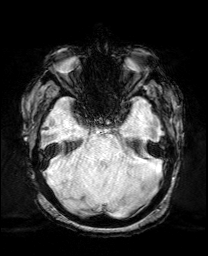
[im 35/52]
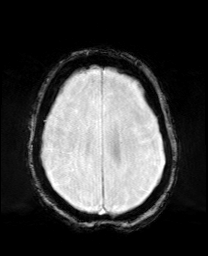
[im 52/52]
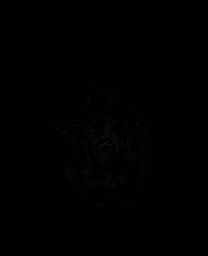

[Series 13: pha_images · axial · 3.0mm · 0.90mm/px · z∈[-122,+27]mm · 4 of 52 slices shown]
[im 1/52]
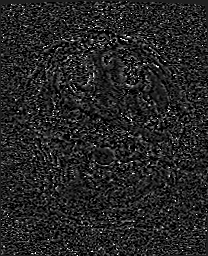
[im 18/52]
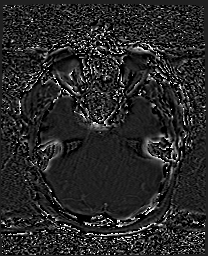
[im 35/52]
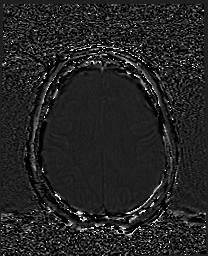
[im 52/52]
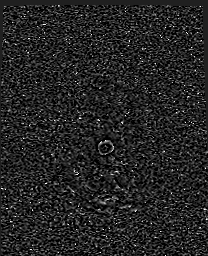

[Series 14: swi_images · axial · 3.0mm · 0.90mm/px · z∈[-122,+27]mm · 4 of 52 slices shown]
[im 1/52]
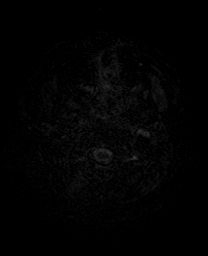
[im 18/52]
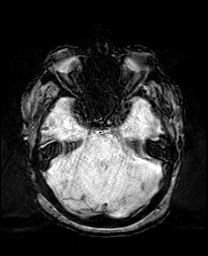
[im 35/52]
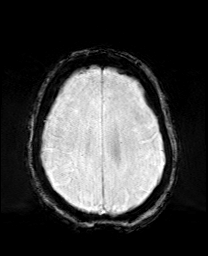
[im 52/52]
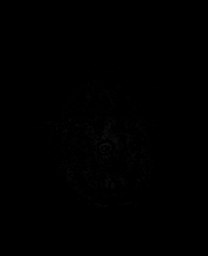

[Series 15: mip_images(sw) · axial · 24.0mm · 0.90mm/px · z∈[-111,+17]mm · 4 of 45 slices shown]
[im 1/45]
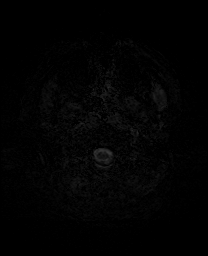
[im 15/45]
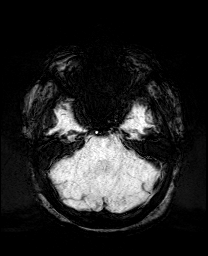
[im 30/45]
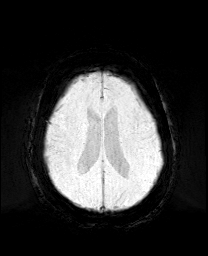
[im 45/45]
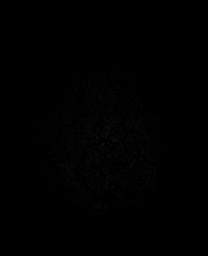

[Series 17: T2 · coronal · 5.0mm · 0.34mm/px · 2 of 28 slices shown (2 of 2)]
[im 1/28]
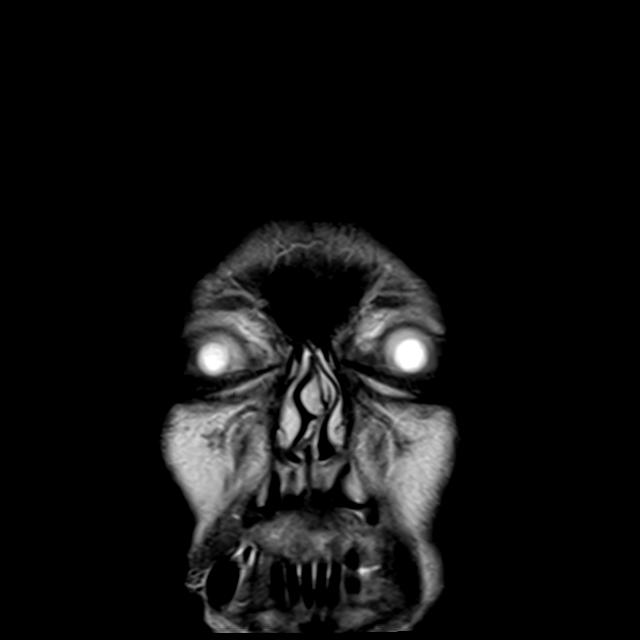
[im 28/28]
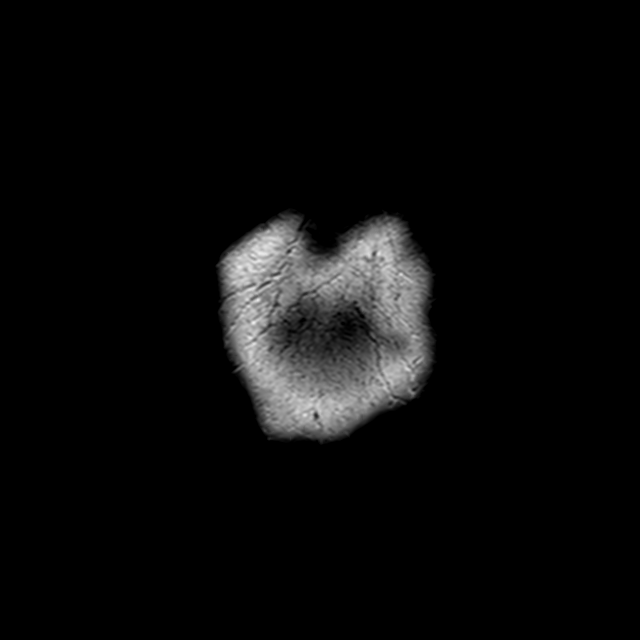

[44 of 48 positions shown; findings below may reference images not displayed]

FINDINGS: Brain: Increased T2 hyperintensities are present within the
posterior temporal, parietal, and occipital cortex in a somewhat
symmetric pattern. There is mild restricted diffusion bilaterally.
No acute hemorrhage or mass lesion is present. White matter is
spared. Basal ganglia are within normal limits. Frontal lobes are
within normal limits bilaterally.

No significant extraaxial fluid collection is present. The
ventricles are of normal size.

T2 hyperintensities are present within the central pons bilaterally.
Cerebellum is unremarkable.

Vascular: Flow is present in the major intracranial arteries.

Skull and upper cervical spine: The craniocervical junction is
normal. Upper cervical spine is within normal limits. Marrow signal
is unremarkable.

Sinuses/Orbits: Fluid levels are present in the left maxillary
sinus, posterior ethmoid air cells and left sphenoid sinus. Mild
mucosal thickening is present in the right sphenoid sinus. Small
mastoid effusions are present bilaterally. No obstructing
nasopharyngeal lesion is present. The paranasal sinuses and mastoid
air cells are otherwise clear. Bilateral lens replacements are
noted. Globes and orbits are otherwise unremarkable.
IMPRESSION: 1. Increased T2 hyperintensities within the posterior temporal,
parietal, and occipital cortex bilaterally with mild restricted
diffusion. Findings are most consistent with posterior reversible
encephalopathy syndrome.
2. No other acute intracranial abnormality.
3. T2 hyperintensities within the central pons bilaterally likely
reflect the sequela of chronic microvascular ischemia.
4. Bilateral mastoid effusions. No obstructing nasopharyngeal lesion
is present.
5. Left maxillary, posterior ethmoid, and left sphenoid sinus
disease.

These results will be called to the ordering clinician or
representative by the Radiologist Assistant, and communication
documented in the PACS or [REDACTED].

## 2021-10-05 IMAGING — CT CT VENOGRAM HEAD
2 of 7 series · 14 of 47 positions shown · IV contrast (OMNI 350)
Comparison: Comparison made with prior study from [DATE].

CLINICAL DATA: Initial evaluation for

EXAM:
CT ANGIOGRAPHY HEAD AND NECK
CT VENOGRAM HEAD
TECHNIQUE: Multidetector CT imaging of the head and neck was performed using
the standard protocol during bolus administration of intravenous
contrast. Multiplanar CT image reconstructions and MIPs were
obtained to evaluate the vascular anatomy. Carotid stenosis
measurements (when applicable) are obtained utilizing NASCET
criteria, using the distal internal carotid diameter as the
denominator.

[Series 22: venogram 2mm · axial · 0.39mm/px · z∈[-152,-30]mm · 11 of 75 slices shown]
[im 7/75  brain]
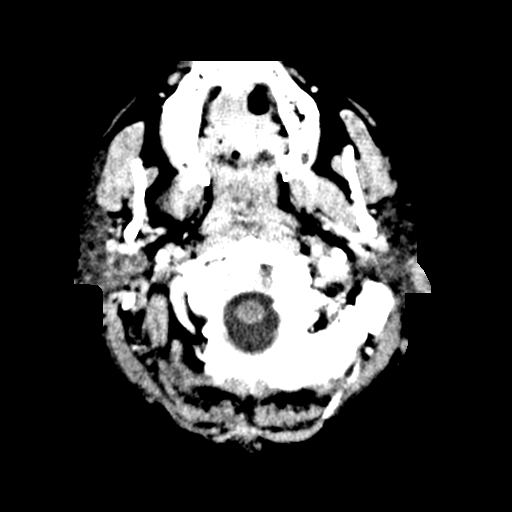
[im 13/75  bone]
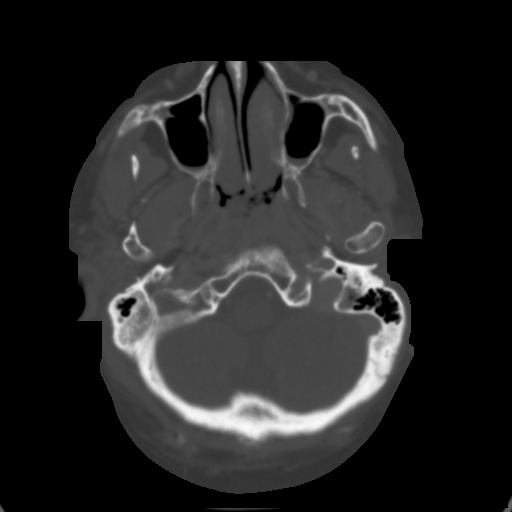
[im 19/75  brain]
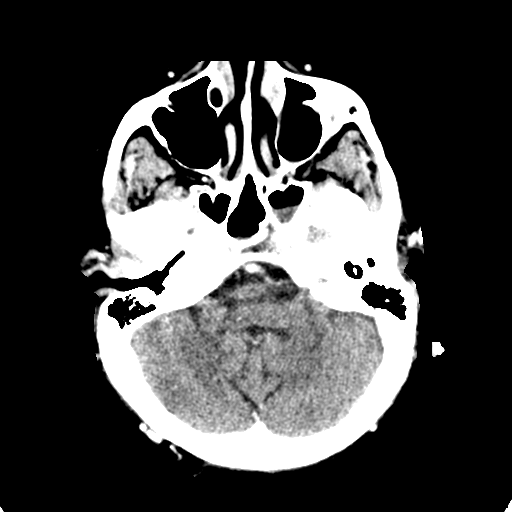
[im 25/75  bone]
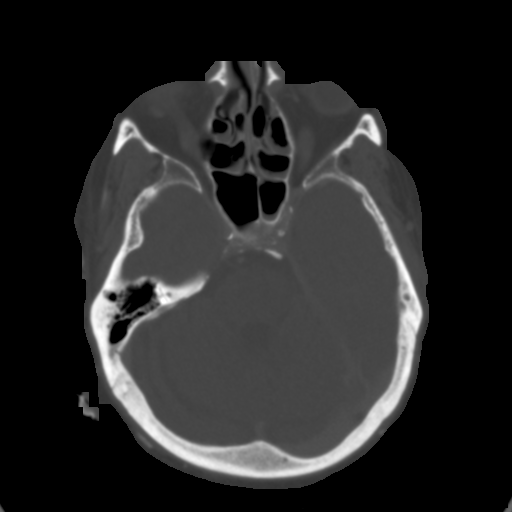
[im 31/75  brain]
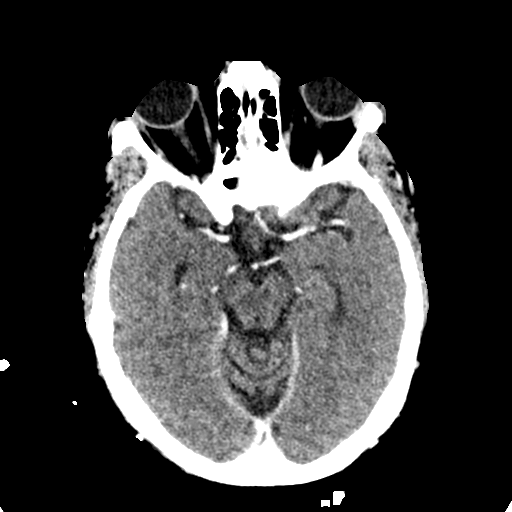
[im 38/75  bone]
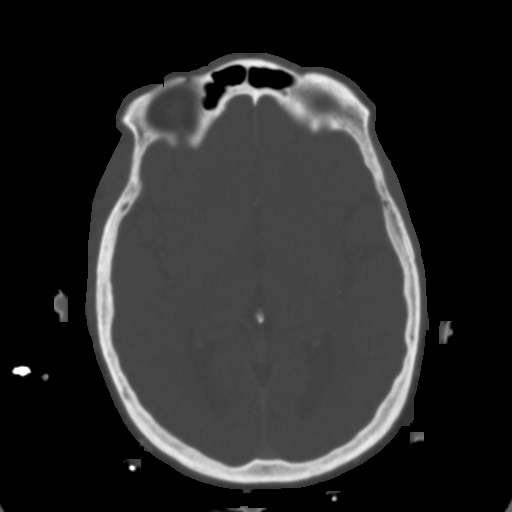
[im 44/75  brain]
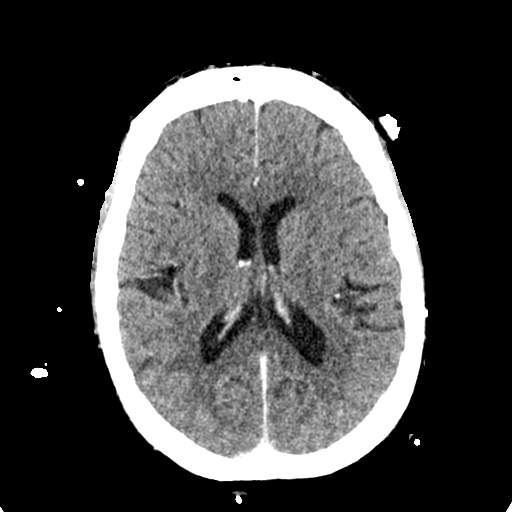
[im 50/75  bone]
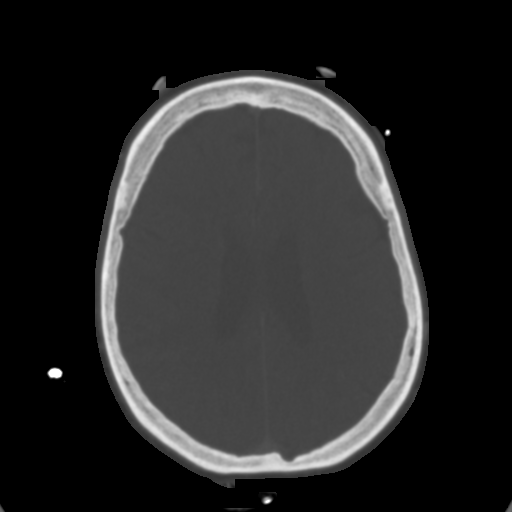
[im 56/75  brain]
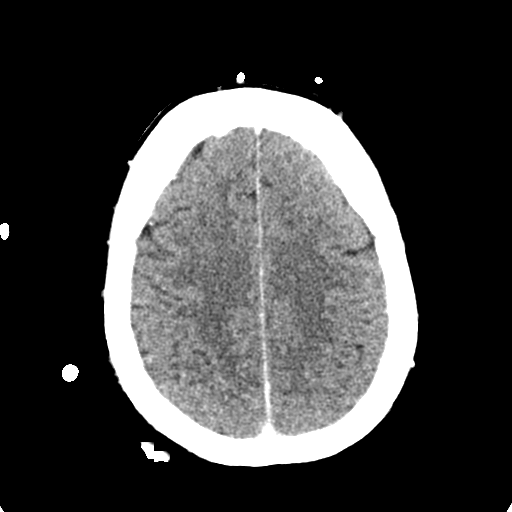
[im 62/75  bone]
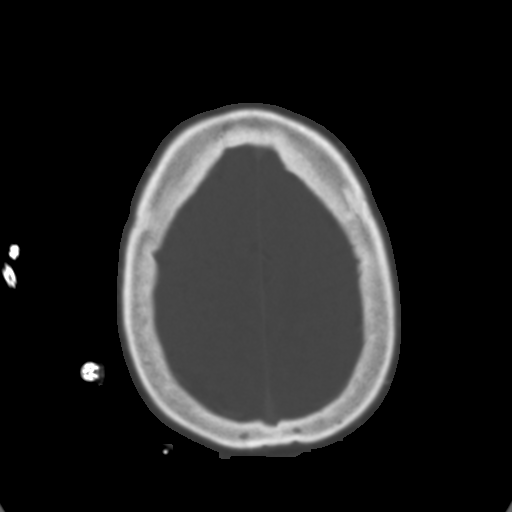
[im 68/75  brain]
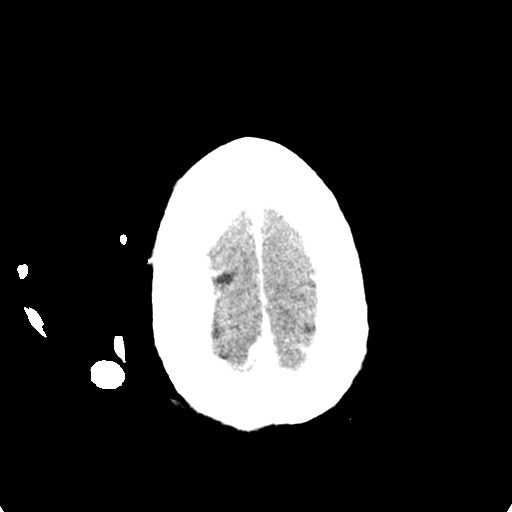

[Series 29: sagthinmip · sagittal · 0.30mm/px · 3 of 201 slices shown]
[im 41/201  brain]
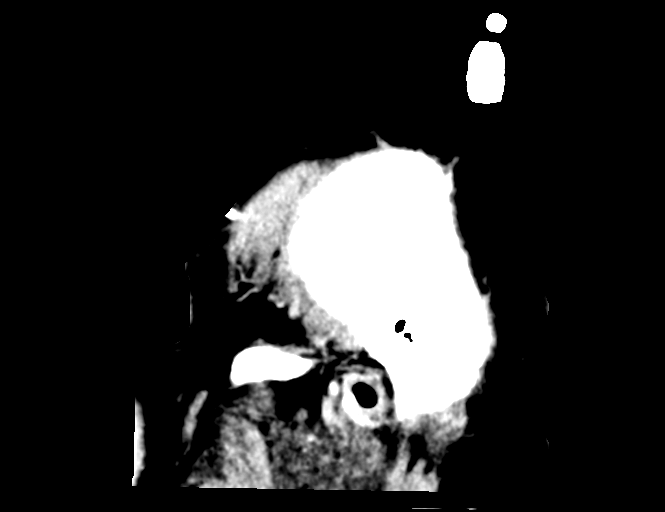
[im 81/201  brain]
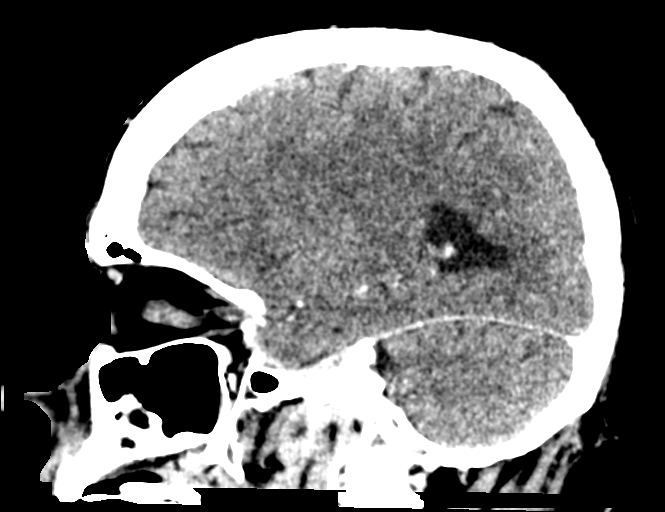
[im 121/201  brain]
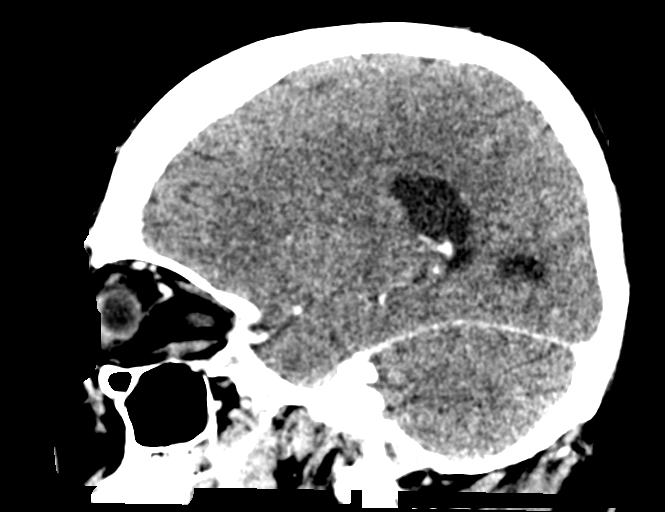

[14 of 47 positions shown; findings below may reference images not displayed]

RADIATION DOSE REDUCTION: This exam was performed according to the
departmental dose-optimization program which includes automated
exposure control, adjustment of the mA and/or kV according to
patient size and/or use of iterative reconstruction technique.

CONTRAST:  75mL OMNIPAQUE IOHEXOL 350 MG/ML SOLN
FINDINGS: CT HEAD FINDINGS

Brain: Mild cerebellar atrophy. No acute intracranial hemorrhage. No
acute large vessel territory infarct. No mass lesion or midline
shift. No hydrocephalus or extra-axial fluid collection.

Vascular: No hyperdense vessel.

Skull: EKG leads overlie the scalp.  Calvarium intact.

Sinuses: Mild-to-moderate mucosal thickening present within the
sphenoid ethmoidal and maxillary sinuses. Mastoid air cells remain
clear. Patient is intubated.

Orbits: Globes or soft tissues demonstrate no acute finding.

Review of the MIP images confirms the above findings

CTA NECK FINDINGS

Aortic arch: Visualized aortic arch normal in caliber with normal
branch pattern. No stenosis about the origin of the great vessels.

Right carotid system: Right CCA patent from its origin to the
bifurcation without stenosis. Eccentric calcified plaque at the
proximal cervical right ICA without hemodynamically significant
stenosis. Right ICA patent distally without stenosis or dissection.

Left carotid system: Left CCA patent without stenosis. Eccentric
calcified plaque at the left carotid bulb/proximal left ICA with
associated stenosis of up to 40% by NASCET criteria. Left ICA patent
distally without stenosis or dissection.

Vertebral arteries: Both vertebral arteries arise from the
subclavian arteries. No proximal subclavian artery stenosis. Soft
plaque at the origin of the right vertebral artery with severe
ostial stenosis (series 12, image 255). Left vertebral artery
dominant. Vertebral arteries otherwise patent without stenosis or
dissection.

Skeleton: No discrete or worrisome osseous lesions. Chronic
appearing mild compression deformities are noted at the superior
endplates of T3 and T4.

Other neck: Endotracheal enteric tubes in place. No other acute soft
tissue abnormality within the neck.

Upper chest: Atelectatic changes noted dependently within the
visualized lungs. Changes of mild pulmonary interstitial edema noted
as well.

Review of the MIP images confirms the above findings

CTA HEAD FINDINGS

Anterior circulation: Petrous segments patent bilaterally.
Atheromatous plaque seen involving the cavernous ICAs bilaterally
with associated moderate to severe stenoses, right worse than left.
A1 segments patent bilaterally. Normal anterior communicating artery
complex. Anterior cerebral arteries patent without stenosis. No M1
stenosis or occlusion. Normal MCA bifurcations. MCA branches well
perfused and symmetric. Distal small vessel atheromatous
irregularity noted.

Posterior circulation: Both vertebral arteries patent without
stenosis. Left vertebral artery dominant. Neither PICA well seen.
Basilar patent to its distal aspect without stenosis or thrombosis.
Superior cerebellar and posterior cerebral arteries patent
bilaterally.

Venous sinuses: Additional CT venogram images were obtained. Normal
enhancement seen throughout the superior sagittal sinus to the
torcula. Transverse and sigmoid sinuses are patent as are the
visualized proximal internal jugular veins. Left transverse sinus
dominant. Straight sinus, vein of PATY, internal cerebral veins,
and basal veins of PATY are patent. No abnormality about the
cavernous sinus. Superior orbital veins symmetric. No appreciable
cortical vein thrombosis.

Anatomic variants: None significant.  No aneurysm.

Review of the MIP images confirms the above findings
IMPRESSION: CTA HEAD AND NECK IMPRESSION:

1. Negative CTA for emergent large vessel occlusion or other
emergent finding.
2. 40% atheromatous stenosis at the origin of the left ICA.
3. Atheromatous plaque about the carotid siphons with associated
moderate to severe stenoses, right worse than left.
4. Severe ostial stenosis at the origin of the right vertebral
artery. Left vertebral artery dominant.
5. Mild pulmonary interstitial edema with superimposed atelectasis.

CT VENOGRAM IMPRESSION:

Negative CT venogram.  No evidence for dural sinus thrombosis.

## 2021-10-05 MED ORDER — SODIUM ZIRCONIUM CYCLOSILICATE 10 G PO PACK
10.0000 g | PACK | Freq: Four times a day (QID) | ORAL | Status: AC
  Filled 2021-10-05: qty 1

## 2021-10-05 MED ORDER — CALCIUM GLUCONATE-NACL 1-0.675 GM/50ML-% IV SOLN
1.0000 g | Freq: Once | INTRAVENOUS | Status: AC
  Administered 2021-10-05: 1000 mg via INTRAVENOUS
  Filled 2021-10-05: qty 50

## 2021-10-05 MED ORDER — LEVETIRACETAM IN NACL 1000 MG/100ML IV SOLN
1000.0000 mg | Freq: Once | INTRAVENOUS | Status: AC
  Administered 2021-10-05: 1000 mg via INTRAVENOUS
  Filled 2021-10-05: qty 100

## 2021-10-05 MED ORDER — HEPARIN SODIUM (PORCINE) 1000 UNIT/ML DIALYSIS
1000.0000 [IU] | INTRAMUSCULAR | Status: DC | PRN
  Administered 2021-10-07: 1000 [IU] via INTRAVENOUS_CENTRAL
  Filled 2021-10-05 (×2): qty 1

## 2021-10-05 MED ORDER — IOHEXOL 350 MG/ML SOLN
75.0000 mL | Freq: Once | INTRAVENOUS | Status: AC | PRN
  Administered 2021-10-05: 75 mL via INTRAVENOUS

## 2021-10-05 MED ORDER — INSULIN ASPART 100 UNIT/ML IV SOLN
5.0000 [IU] | Freq: Once | INTRAVENOUS | Status: AC
  Administered 2021-10-05: 5 [IU] via INTRAVENOUS

## 2021-10-05 MED ORDER — ALBUTEROL SULFATE (2.5 MG/3ML) 0.083% IN NEBU
10.0000 mg | INHALATION_SOLUTION | Freq: Once | RESPIRATORY_TRACT | Status: AC
  Administered 2021-10-05: 7.5 mg via RESPIRATORY_TRACT
  Filled 2021-10-05: qty 12

## 2021-10-05 MED ORDER — DEXTROSE 50 % IV SOLN
1.0000 | Freq: Once | INTRAVENOUS | Status: AC
  Administered 2021-10-05: 50 mL via INTRAVENOUS
  Filled 2021-10-05: qty 50

## 2021-10-05 MED ORDER — LORAZEPAM 2 MG/ML IJ SOLN
2.0000 mg | INTRAMUSCULAR | Status: DC | PRN
  Administered 2021-10-05 – 2021-10-14 (×3): 2 mg via INTRAVENOUS
  Filled 2021-10-05 (×3): qty 1

## 2021-10-05 MED ORDER — SODIUM CHLORIDE 0.9 % IV SOLN: 2.0000 g | INTRAVENOUS | Status: DC

## 2021-10-05 MED ORDER — PROSOURCE TF PO LIQD
45.0000 mL | Freq: Two times a day (BID) | ORAL | Status: DC
  Administered 2021-10-05 – 2021-10-06 (×2): 45 mL
  Filled 2021-10-05 (×2): qty 45

## 2021-10-05 MED ORDER — SODIUM BICARBONATE 8.4 % IV SOLN
50.0000 meq | Freq: Once | INTRAVENOUS | Status: AC
  Administered 2021-10-05: 50 meq via INTRAVENOUS
  Filled 2021-10-05: qty 50

## 2021-10-05 MED ORDER — SODIUM CHLORIDE 0.9 % IV SOLN: 100.0000 mL | INTRAVENOUS | Status: DC | PRN

## 2021-10-05 MED ORDER — SODIUM ZIRCONIUM CYCLOSILICATE 10 G PO PACK
10.0000 g | PACK | Freq: Two times a day (BID) | ORAL | Status: DC
  Filled 2021-10-05: qty 1

## 2021-10-05 MED ORDER — VITAL HIGH PROTEIN PO LIQD
1000.0000 mL | ORAL | Status: DC
  Administered 2021-10-05: 1000 mL

## 2021-10-05 MED ORDER — SODIUM CHLORIDE 0.9 % IV SOLN
2.0000 g | INTRAVENOUS | Status: AC
  Administered 2021-10-05 – 2021-10-07 (×3): 2 g via INTRAVENOUS
  Filled 2021-10-05 (×4): qty 20

## 2021-10-05 MED ORDER — MEDIHONEY WOUND/BURN DRESSING EX PSTE
1.0000 "application " | PASTE | CUTANEOUS | Status: DC
  Administered 2021-10-07 – 2021-10-19 (×5): 1 via TOPICAL
  Filled 2021-10-05 (×2): qty 44

## 2021-10-05 MED ORDER — VANCOMYCIN HCL IN DEXTROSE 1-5 GM/200ML-% IV SOLN: 1000.0000 mg | INTRAVENOUS | Status: DC

## 2021-10-05 MED ORDER — LEVETIRACETAM IN NACL 500 MG/100ML IV SOLN
500.0000 mg | Freq: Two times a day (BID) | INTRAVENOUS | Status: DC
Start: 2021-10-05 — End: 2021-10-06
  Administered 2021-10-05 – 2021-10-06 (×2): 500 mg via INTRAVENOUS
  Filled 2021-10-05 (×2): qty 100

## 2021-10-05 MED ORDER — SODIUM ZIRCONIUM CYCLOSILICATE 10 G PO PACK
10.0000 g | PACK | Freq: Two times a day (BID) | ORAL | Status: DC
Start: 2021-10-05 — End: 2021-10-05
  Administered 2021-10-05: 10 g

## 2021-10-05 MED ORDER — CALCIUM GLUCONATE 10 % IV SOLN
1.0000 g | Freq: Once | INTRAVENOUS | Status: DC
  Filled 2021-10-05: qty 10

## 2021-10-05 NOTE — Progress Notes (Signed)
Admit: 09/07/2021 ?LOS: 1 ? ?49F ESRD found unresponsive at home, mild hyperkalemia ? ?Subjective:  ?Cont to have poor neuro exam ?For HD today ?CT imaging head/neck imaging ?MRI prdered ? ?03/29 0701 - 03/30 0700 ?In: 3306.1 [I.V.:717.2; IV Piggyback:2588.9] ?Out: -  ? ?Filed Weights  ? 09/08/2021 1009  ?Weight: 83.9 kg  ? ? ?Scheduled Meds: ? aspirin  81 mg Per Tube Daily  ? atorvastatin  80 mg Per Tube q1800  ? carvedilol  12.5 mg Per Tube BID WC  ? chlorhexidine gluconate (MEDLINE KIT)  15 mL Mouth Rinse BID  ? Chlorhexidine Gluconate Cloth  6 each Topical Q0600  ? docusate  100 mg Per Tube BID  ? ezetimibe  10 mg Per Tube Daily  ? heparin injection (subcutaneous)  5,000 Units Subcutaneous Q8H  ? insulin aspart  0-6 Units Subcutaneous Q4H  ? leptospermum manuka honey  1 application. Topical UD  ? levothyroxine  50 mcg Per Tube QAC breakfast  ? mouth rinse  15 mL Mouth Rinse 10 times per day  ? pantoprazole sodium  40 mg Per Tube Daily  ? polyethylene glycol  17 g Per Tube Daily  ? sodium zirconium cyclosilicate  10 g Per Tube Q6H  ? [START ON 10/13/2021] thiamine injection  100 mg Intravenous Daily  ? ?Continuous Infusions: ? sodium chloride 10 mL/hr at 09/22/2021 1715  ? sodium chloride    ? sodium chloride    ? sodium chloride    ? cefTRIAXone (ROCEPHIN)  IV 2 g (10/05/21 1021)  ? norepinephrine (LEVOPHED) Adult infusion Stopped (10/05/21 0108)  ? thiamine injection 500 mg (10/05/21 0522)  ? Followed by  ? [START ON 10/07/2021] thiamine injection    ? ?PRN Meds:.sodium chloride, sodium chloride, acetaminophen, fentaNYL (SUBLIMAZE) injection, heparin, midazolam ? ?Current Labs: reviewed  ? ? ?Physical Exam:  Blood pressure (!) 143/71, pulse 92, temperature 97.6 ?F (36.4 ?C), temperature source Oral, resp. rate 15, height '5\' 3"'  (1.6 m), weight 83.9 kg, last menstrual period 12/17/2016, SpO2 100 %. ?GEN: Intubated, comatose ?ENT: ET tube in oropharynx ?EYES: Eyes closed ?CV: Regular, normal S1 and S2 ?PULM: Coarse breath  sounds bilaterally ?ABD: Soft ?SKIN: No rashes or lesions ?EXT: 2+ edema ?VASCULAR: Left upper arm AV fistula with bruit and thrill ? ?A ?ESRD TTS [facility] LUE AVF ?Mild hyperkalemia, K 6.1 last check ?Found unresponsive at home / AMS, neuro following, imaging negative ?VDRF, airway protection ?DM2 ?? Sepsis started on broad spectrum ABX\ ?Anemia, Hb 10.2 ?CKD-BMM: Ca stable, trend P ? ?P ?HD today: 2K, AVF, no heparin, 400/600, 3h, 1-2L UF  SBP > 105 ?Medication Issues; ?Preferred narcotic agents for pain control are hydromorphone, fentanyl, and methadone. Morphine should not be used.  ?Baclofen should be avoided ?Avoid oral sodium phosphate and magnesium citrate based laxatives / bowel preps  ? ? ?Pearson Grippe MD ?10/05/2021, 12:10 PM ? ?Recent Labs  ?Lab 09/06/2021 ?1021 10/02/2021 ?1236 10/03/2021 ?1838 10/05/21 ?7616 10/05/21 ?0709 10/05/21 ?1006  ?NA 136  --  139 136  --   --   ?K 6.3*   < > 5.7* 5.7* 6.5* 6.1*  ?CL 102  --  104 100  --   --   ?CO2 24  --  22 22  --   --   ?GLUCOSE 137*  --  61* 180*  --   --   ?BUN 40*  --  39* 41*  --   --   ?CREATININE 5.29*  --  5.44* 5.87*  --   --   ?  CALCIUM 8.3*  --  8.1* 8.2*  --   --   ? < > = values in this interval not displayed.  ? ?Recent Labs  ?Lab 09/13/2021 ?1021 10/05/21 ?0333  ?WBC 11.3* 7.7  ?NEUTROABS 9.4*  --   ?HGB 11.2* 10.2*  ?HCT 36.1 30.7*  ?MCV 110.4* 105.1*  ?PLT 229 199  ? ? ? ? ? ? ? ? ? ?  ?

## 2021-10-05 NOTE — Progress Notes (Addendum)
Subjective: No clinical seizures overnight.  Patient boyfriend at bedside states she started insulin about 20 days ago and per her boyfriend she was not feeling well.  Denies any prior seizure-like activity. ? ?Of note, history was obtained with the help of an interpreter ? ?ROS: Unable to obtain due to poor mental status ? ?Examination ? ?Vital signs in last 24 hours: ?Temp:  [97.6 ?F (36.4 ?C)-99.2 ?F (37.3 ?C)] 97.6 ?F (36.4 ?C) (03/30 0343) ?Pulse Rate:  [63-104] 80 (03/30 0835) ?Resp:  [12-27] 12 (03/30 0830) ?BP: (67-153)/(39-110) 134/66 (03/30 0835) ?SpO2:  [100 %] 100 % (03/30 0830) ?FiO2 (%):  [40 %-100 %] 40 % (03/30 0806) ? ?General: lying in bed, NAD ?Neuro: Does not open eyes to noxious stimuli, left pupil does not appear to be reacting to light, right pupil sluggishly reacting to light, downward gaze deviation, corneal reflex absent, subtle withdrawal to noxious stimuli in all extremities ? ?Basic Metabolic Panel: ?Recent Labs  ?Lab 09/22/2021 ?1021 09/19/2021 ?1236 09/20/2021 ?1838 10/05/21 ?7408 10/05/21 ?1448  ?NA 136  --  139 136  --   ?K 6.3* 5.4* 5.7* 5.7* 6.5*  ?CL 102  --  104 100  --   ?CO2 24  --  22 22  --   ?GLUCOSE 137*  --  61* 180*  --   ?BUN 40*  --  39* 41*  --   ?CREATININE 5.29*  --  5.44* 5.87*  --   ?CALCIUM 8.3*  --  8.1* 8.2*  --   ? ? ?CBC: ?Recent Labs  ?Lab 09/25/2021 ?1021 10/05/21 ?0333  ?WBC 11.3* 7.7  ?NEUTROABS 9.4*  --   ?HGB 11.2* 10.2*  ?HCT 36.1 30.7*  ?MCV 110.4* 105.1*  ?PLT 229 199  ? ? ? ?Coagulation Studies: ?Recent Labs  ?  10/02/2021 ?1021  ?LABPROT 13.7  ?INR 1.1  ? ? ?Imaging ? ?CT head without contrast 09/06/2021: No acute abnormality ?CTA head and neck with contrast 10/05/2021: No large vessel occlusion.  40% stenosis at the origin of left ICA.  Atheromatous plaque with moderate to severe stenosis in right more than left carotid siphons.  Severe stenosis at the origin of right vertebral artery. ? ?CTV head with contrast 10/05/2021: No venous sinus  thrombosis ? ?ASSESSMENT AND PLAN: 51 year old female with seizures and altered mental status in the setting of systolic blood pressure 185 which has since improved. ? ?Seizure ?Acute encephalopathy ?-No evidence of seizures overnight.  Could have been provoked seizures due to hypertension, hypoglycemia ?-Encephalopathy could be due to postictal state, sedation, hypertension, toxic-metabolic causes ? ?Recommendations ?-MRI brain without contrast ordered and pending ?-Continue Keppra 500 mg twice daily ?-Stop propofol this morning.  Will likely discontinue LTM EEG tomorrow if no seizures overnight ?-Continue seizure precautions ?-As needed IV Ativan 2 mg for clinical seizure-like activity ?-Management of rest of comorbidities per primary team ? ?I have spent a total of  36  minutes with the patient reviewing hospital notes,  test results, labs and examining the patient as well as establishing an assessment and plan that was discussed personally with patient's boyfriend as well as critical care team.  > 50% of time was spent in direct patient care. ?  ? ?Zeb Comfort ?Epilepsy ?[facility] ?For questions after 5pm please refer to AMION to reach the Neurologist on call ? ?

## 2021-10-05 NOTE — TOC Progression Note (Signed)
Transition of Care (TOC) - Initial/Assessment Note  ? ? ?Patient Details  ?Name: Melanie Hall ?MRN: [id number] ?Date of Birth: 03/23/1971 ? ?Transition of Care (TOC) CM/SW Contact:    ?Paulene Floor Thalia Turkington, LCSWA ?Phone Number: ?10/05/2021, 12:07 PM ? ?Clinical Narrative:                 ?Patient from home with hx of ESRD on HD was found at her home by transport driver unresponsive.  Patient is currently on the vent.  Transition of Care Department [facility]) has reviewed patient and no TOC needs have been identified at this time. We will continue to monitor patient advancement through interdisciplinary progression rounds. If new patient transition needs arise, please place a TOC consult. ?  ? ? ?Patient Goals and CMS Choice ?  ?  ?  ? ?Expected Discharge Plan and Services ?  ?  ?  ?  ?  ?                ?  ?  ?  ?  ?  ?  ?  ?  ?  ?  ? ?Prior Living Arrangements/Services ?  ?  ?  ?       ?  ?  ?  ?  ? ?Activities of Daily Living ?  ?  ? ?Permission Sought/Granted ?  ?  ?   ?   ?   ?   ? ?Emotional Assessment ?  ?  ?  ?  ?  ?  ? ?Admission diagnosis:  Altered mental status [R41.82] ?Coma, unspecified coma depth ([place]) [R40.20] ?Patient Active Problem List  ? Diagnosis Date Noted  ? Pressure injury of skin 10/05/2021  ? Altered mental status 09/21/2021  ? Swelling of left lower extremity 09/20/2021  ? CAD (coronary artery disease) 02/17/2021  ? Ischemic cardiomyopathy 02/17/2021  ? Pain and tenderness 11/10/2020  ? Encounter for support and coordination of transition of care 06/16/2020  ? S/P ankle fusion 06/01/2020  ? Pilon fracture of left tibia, closed, initial encounter   ? Displaced trimalleolar fracture of left lower leg, initial encounter for closed fracture   ? Abnormality of gait and mobility 05/26/2020  ? Migraine 05/26/2020  ? Diabetic retinopathy ([place]) 05/04/2020  ? Chronic diarrhea of unknown origin 01/14/2020  ? Type 2 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, with long-term current use of  insulin ([place]) 12/29/2019  ? Hypothyroidism 12/29/2019  ? Weakness of both lower extremities 11/12/2019  ? Debility 09/10/2019  ? ESRD (end stage renal disease) on dialysis ([place]) 09/10/2019  ? Depression 09/10/2019  ? Incontinence in female 09/10/2019  ? Anemia in chronic kidney disease 08/19/2019  ? MGUS (monoclonal gammopathy of unknown significance) 07/23/2019  ? GERD (gastroesophageal reflux disease) 02/28/2019  ? Essential hypertension 10/28/2018  ? Irregular menses 09/14/2009  ? Hypercholesterolemia 11/19/2008  ? Type 2 diabetes with nephropathy ([place]) 07/02/2007  ? ?PCP:  Renee Rival, FNP ?Pharmacy:   ?[facility], [place] [address] ?[address] ?[place] [address] ?Phone: [phone] Fax: [phone] ? ?[place], [place] ?[place] [address] ?Phone: [phone] Fax: [phone] ? ? ? ? ?Social Determinants of Health (SDOH) Interventions ?  ? ?Readmission Risk Interventions ?   ? View : No data to display.  ?  ?  ?  ? ? ? ?

## 2021-10-05 NOTE — Progress Notes (Signed)
RT note. ?Patient transported on vent to MRI and back without any complications. RT will continue to monitor.  ?

## 2021-10-05 NOTE — Progress Notes (Signed)
eLink Physician-Brief Progress Note ?Patient Name: Melanie Hall ?DOB: 22-May-1971 ?MRN: [id number] ? ? ?Date of Service ? 10/05/2021  ?HPI/Events of Note ? Labs reviewed with K at 5.7.  Pt with ESRD and has not had hemodialysis.   ?eICU Interventions ? Give insulin and D50.  ?Pt is scheduled for HD today.   ? ? ? ?Intervention Category ?Intermediate Interventions: Electrolyte abnormality - evaluation and management ? ?Melanie Hall ?10/05/2021, 6:28 AM ?

## 2021-10-05 NOTE — Progress Notes (Addendum)
? ?NAME:  Melanie Hall, MRN:  [id number], DOB:  31-Mar-1971, LOS: 1 ?ADMISSION DATE:  09/19/2021, CONSULTATION DATE:  09/20/2021 ?REFERRING MD:  Dr. Roderic Palau, ER, CHIEF COMPLAINT:  AMS  ? ?History of Present Illness:  ?51 yo female former smoker with hx of ESRD on HD was found at her home by patient transport driver unresponsive.  Pt's neighbor noted that she was tapping her hands for about a minute, and then stopped.  She has snoring and drooling.  No improvement after narcan.  CBG normal.  Noted to have BP 215/96.  Intubated in [facility] for airway protection, given keppra load, started on diprivan, and started on antibiotics.  CT head negative for acute findings.  Treated for hyperkalemia.  Pt's daughter report she had similar episode on 09/24/21 and EMS was called to the house, but episode resolve and patient declined hospital evaluation.  No prior history of seizures or known head injury.  ER team d/w case with neurology and advised to transfer to [facility] for EEG monitoring and further neurology assessment. ? ?Hx from medical team, patients daughter at bedside, and medical records. ? ?Pertinent  Medical History  ?CHF, Anemia, OA, CAD, DM type 2, ESRD on HD, HLD, HTN, CVA ? ?Significant Hospital Events: ?Including procedures, antibiotic start and stop dates in addition to other pertinent events   ?3/29 present to [facility], intubated, transfer to [facility] ? ?Interim History / Subjective:  ? ?Sedated. Intubated. ?Boyfriend states that she was in her normal health on Tuesday. Patient reportedly compliant with her HD and medications. She had a hypoglycemia episode 2 weeks ago but did not require admission.  ? ?K 6.5 this AM - give insulin, dextrose, bicarb, calcium gluconate, lokelma. HD today ?45 propofol ? ?Objective   ?Blood pressure 137/69, pulse 66, temperature 97.6 ?F (36.4 ?C), temperature source Oral, resp. rate 18, height '5\' 3"'$  (1.6 m), weight 83.9 kg, last menstrual period 12/17/2016, SpO2 100 %. ?   ?Vent Mode: PRVC ?FiO2 (%):   [40 %-100 %] 40 % ?Set Rate:  [18 bmp] 18 bmp ?Vt Set:  [420 mL] 420 mL ?PEEP:  [5 cmH20] 5 cmH20 ?Plateau Pressure:  [14 cmH20-19 cmH20] 16 cmH20  ? ?Intake/Output Summary (Last 24 hours) at 10/05/2021 0749 ?Last data filed at 10/05/2021 0600 ?Gross per 24 hour  ?Intake 3306.11 ml  ?Output --  ?Net 3306.11 ml  ? ? ?Filed Weights  ? 09/24/2021 1009  ?Weight: 83.9 kg  ? ? ?Examination: ? ?General - sedated.  ?HEENT - ETT in place. Does not open eyes ?Cardiac - regular rate/rhythm, no murmur ?Chest - equal breath sounds b/l, no wheezing or rales ?Abdomen - soft, non tender, + bowel sounds ?Extremities - AV fistula Lt arm, trace edema of bilat LE ?Skin - no rashes ?Neuro - withdraw to pain, more at LE.  ? ?Resolved Hospital Problem list   ? ? ?Assessment & Plan:  ?Acute metabolic encephalopathy ?Questionable seizure ?Unclear cause of her encephalopathy at this time.  No seizures seen on spot EEG but showed cortical dysfunction from the left hemisphere.  CT head did not show any acute intracranial hemorrhage/mass/lesion.  No dural sinus thrombus. There are mild stenosis of the left ICA but should not cause her issue. ?Other work-ups have been unrevealing. ?CVA should not cause this extent of encephalopathy and a large stroke should appear on CT. ?- Will order brain MRI ?- Wake up trial this AM. Stop Propofol. Can start Precedex if vent dyssynchrony ?- Pending overnight EEG results ?-  hold outpt neurontin, robaxin, tramadol ? ?Acute respiratory failure requiring mechanical ventilation ?Intubated for airway protection ?- On minimal setting ?- VAP bundle ? ?Probable aspiration PNA  ?No evidence of sepsis. Stop Vanc and Cefepime to minimal neuro toxic. ?Slight infiltrate seen on Right upper lobe.  ?- Start Ceftriaxone for possible aspiration PNA (allergic to penicillin) ? ?ESRD on iHD. ?Hyperkalemia  ?K6.5 this morning.  Treat as hyperkalemic emergency.   ?-She is to get HD today.  Nephrology is following.  She has a left  AVF ?- Lokelma 10 g x 2 ? ?Hx of HTN, systolic CHF, CAD, HLD. ?- continue ASA, lipitor, coreg, zetia ?- hold outpt hydralazine, imdur for now ? ?DM type 2 poorly controlled with hyperglycemia. ?- SSI ? ?Hx of hypothyroidism. ?TSH WNL ?- continue synthroid ? ?Best Practice (right click and "Reselect all SmartList Selections" daily)  ? ?Diet/type: NPO ?DVT prophylaxis: prophylactic heparin  ?GI prophylaxis: PPI ?Lines: N/A ?Foley:  N/A ?Code Status:  full code ?Last date of multidisciplinary goals of care discussion [Updated boyfriend and son in law at bedside] ? ?Labs   ?CBC: ?Recent Labs  ?Lab 09/17/2021 ?1021 10/05/21 ?0333  ?WBC 11.3* 7.7  ?NEUTROABS 9.4*  --   ?HGB 11.2* 10.2*  ?HCT 36.1 30.7*  ?MCV 110.4* 105.1*  ?PLT 229 199  ? ? ? ?Basic Metabolic Panel: ?Recent Labs  ?Lab 09/22/2021 ?1021 09/23/2021 ?1236 10/05/21 ?2637 10/05/21 ?8588  ?NA 136  --  136  --   ?K 6.3* 5.4* 5.7* 6.5*  ?CL 102  --  100  --   ?CO2 24  --  22  --   ?GLUCOSE 137*  --  180*  --   ?BUN 40*  --  41*  --   ?CREATININE 5.29*  --  5.87*  --   ?CALCIUM 8.3*  --  8.2*  --   ? ? ?GFR: ?Estimated Creatinine Clearance: 11.6 mL/min (A) (by C-G formula based on SCr of 5.87 mg/dL (H)). ?Recent Labs  ?Lab 09/27/2021 ?1021 10/06/2021 ?1235 10/05/21 ?0333  ?WBC 11.3*  --  7.7  ?LATICACIDVEN 1.5 1.2  --   ? ? ? ?Liver Function Tests: ?Recent Labs  ?Lab 10/03/2021 ?1021  ?AST 19  ?ALT 12  ?ALKPHOS 144*  ?BILITOT 0.9  ?PROT 7.6  ?ALBUMIN 3.6  ? ? ?No results for input(s): LIPASE, AMYLASE in the last 168 hours. ?Recent Labs  ?Lab 10/05/2021 ?2132  ?AMMONIA 42*  ? ? ?ABG ?   ?Component Value Date/Time  ? PHART 7.42 09/24/2021 1128  ? PCO2ART 36 09/20/2021 1128  ? PO2ART 465 (H) 10/03/2021 1128  ? HCO3 23.2 09/23/2021 1128  ? TCO2 28 02/28/2021 0928  ? ACIDBASEDEF 0.8 09/08/2021 1128  ? O2SAT 99.4 09/14/2021 1128  ? ?  ? ?Coagulation Profile: ?Recent Labs  ?Lab 09/16/2021 ?1021  ?INR 1.1  ? ? ? ?Cardiac Enzymes: ?No results for input(s): CKTOTAL, CKMB, CKMBINDEX,  TROPONINI in the last 168 hours. ? ?HbA1C: ?Hgb A1c MFr Bld  ?Date/Time Value Ref Range Status  ?09/20/2021 09:18 AM 10.5 (H) 4.8 - 5.6 % Final  ?  Comment:  ?           Prediabetes: 5.7 - 6.4 ?         Diabetes: >6.4 ?         Glycemic control for adults with diabetes: <7.0 ?  ?02/14/2021 08:58 AM 8.7 (H) 4.8 - 5.6 % Final  ?  Comment:  ?  Prediabetes: 5.7 - 6.4 ?         Diabetes: >6.4 ?         Glycemic control for adults with diabetes: <7.0 ?  ? ? ?CBG: ?Recent Labs  ?Lab 09/21/2021 ?1548 09/17/2021 ?1943 09/30/2021 ?2104 09/19/2021 ?2321 10/05/21 ?1607  ?GLUCAP 75 63* 146* 128* 180*  ? ? ? ?Review of Systems:   ?Unable to obtain. ? ?Past Medical History:  ?She,  has a past medical history of Anemia, Arthritis, CAD (coronary artery disease), Cataracts, bilateral, CATARACTS, BILATERAL (07/02/2007), Closed fracture of left femur ([place]) (10/28/2018), Closed fracture of right ankle (11/06/2017), Diabetes mellitus, Emphysematous cystitis (08/26/2018), ESRD on hemodialysis ([place]), GERD (gastroesophageal reflux disease), Hyperlipidemia, Hypertension, IRREGULAR MENSES (09/14/2009), Ischemic cardiomyopathy, PARONYCHIA, RIGHT GREAT TOE (07/30/2008), Pressure ulcer (09/10/2019), Right arm weakness (08/08/2019), STEMI involving right coronary artery ([place]) (10/18/2019), Stroke ([place]) (10/2019), SVD (spontaneous vaginal delivery), Vaginosis (08/26/2018), and Weakness of both lower extremities.  ? ?Surgical History:  ? ?Past Surgical History:  ?Procedure Laterality Date  ? A/V FISTULAGRAM Left 01/12/2020  ? Procedure: A/V FISTULAGRAM;  Surgeon: Serafina Mitchell, MD;  Location: [facility];  Service: Cardiovascular;  Laterality: Left;  ? A/V FISTULAGRAM N/A 03/17/2020  ? Procedure: A/V FISTULAGRAM - Left Arm;  Surgeon: Marty Heck, MD;  Location: [facility];  Service: Cardiovascular;  Laterality: N/A;  ? A/V FISTULAGRAM Left 06/23/2020  ? Procedure: A/V FISTULAGRAM;  Surgeon: Marty Heck, MD;   Location: [facility];  Service: Cardiovascular;  Laterality: Left;  ? ANKLE FUSION Left 06/01/2020  ? Procedure: LEFT ANKLE FUSION;  Surgeon: Newt Minion, MD;  Location: [facility];  Service: Orthopedics;  [place]

## 2021-10-05 NOTE — Progress Notes (Signed)
Brief Nutrition Note ? ?Discussed pt with CCM Resident. Consult received for enteral/tube feeding initiation and management. Pt with OG tube in place. ? ?Adult Enteral Nutrition Protocol initiated. Full assessment to follow by RD on 3/31. ? ?Admitting Dx: Altered mental status [R41.82] ?Coma, unspecified coma depth ([place]) [R40.20] ? ?Body mass index is 32.77 kg/m?Marland Kitchen Pt meets criteria for obesity class I based on current BMI. ? ?Labs: ? ?Recent Labs  ?Lab 09/21/2021 ?1021 09/15/2021 ?1236 09/26/2021 ?1838 10/05/21 ?8270 10/05/21 ?0709 10/05/21 ?1006  ?NA 136  --  139 136  --   --   ?K 6.3*   < > 5.7* 5.7* 6.5* 6.1*  ?CL 102  --  104 100  --   --   ?CO2 24  --  22 22  --   --   ?BUN 40*  --  39* 41*  --   --   ?CREATININE 5.29*  --  5.44* 5.87*  --   --   ?CALCIUM 8.3*  --  8.1* 8.2*  --   --   ?GLUCOSE 137*  --  61* 180*  --   --   ? < > = values in this interval not displayed.  ? ? ? ?Gustavus Bryant, MS, RD, LDN ?Inpatient Clinical Dietitian ?Please see AMiON for contact information. ? ?

## 2021-10-05 NOTE — Consult Note (Signed)
WOC Nurse Consult Note: ?Patient receiving care in [facility] 3M04 ?Reason for Consult: toe DTI ?Wound type: Full thickness wound on the anterior right great toe ?Pressure Injury POA: NA ?Measurement: 0.7 cm x 0.7 cm x 0.2 cm ?Wound bed: dry, crusted, black ?Drainage (amount, consistency, odor) None ?Periwound: Erythema ?Dressing procedure/placement/frequency: ?Clean the feet with soap and water, rinse and pat dry. Apply Sween moisturizing lotion (pink top in clean supply) on the BLE and then apply a nickel thick layer of MediHoney directly to the right great toe wound or onto a dressing. Make certain that the dressing covers the entire wound base, but not in contact with the peri-wound skin. Then cover with dry gauze and secure with small Kerlix or a foam dressing. Change dressing every other day.  ? ?Monitor the wound area(s) for worsening of condition such as: ?Signs/symptoms of infection, increase in size, development of or worsening of odor, ?development of pain, or increased pain at the affected locations.   ?Notify the medical team if any of these develop. ? ?Thank you for the consult. [facility] nurse will not follow at this time.   ?Please re-consult the [facility] team if needed. ? ?Cathlean Marseilles. Tamala Julian, MSN, RN, CMSRN, AGCNS, WTA ?Wound Treatment Associate ?Pager [phone]   ? ? ? ?  ? ? ?  ?

## 2021-10-05 NOTE — Progress Notes (Signed)
Patient is listed to have Iodine contrast allergy listed under allergies but seems to get nausea and vomiting to iodinated contrast which is not a true allergy. She was given Solumedrol and Benadryl and she is already intubated. She should be okay to get iodinated contrast for CT Angio head and neck and CT Venogram. ? ? ?Donnetta Simpers ?[facility] ?Pager Number [phone] ?

## 2021-10-05 NOTE — Procedures (Addendum)
Patient Name: Melanie Hall  ?MRN: [id number]  ?Epilepsy Attending: Lora Havens  ?Referring Physician/Provider: Derek Jack, MD ?Duration: 09/22/2021 1722 to 10/05/2021 1722 ?  ?Patient history: 51yo F with ams. EEG to evaluate for seizure ?  ?Level of alertness:  comatose ?  ?AEDs during EEG study: Propofol ?  ?Technical aspects: This EEG study was done with scalp electrodes positioned according to the 10-20 International system of electrode placement. Electrical activity was acquired at a sampling rate of '500Hz'$  and reviewed with a high frequency filter of '70Hz'$  and a low frequency filter of '1Hz'$ . EEG data were recorded continuously and digitally stored.  ?  ?Description: At the beginning of the study, EEG showed burst suppression pattern with burst of high amplitude polymorphic sharply contoured discharges lasting 2-5 seconds alternating with 3-7 seconds of generalized eeg suppression.  Gradually, EEG showed near continuous polymorphic sharply contoured 3-7 hz theta-delta slowing admixed with brief 1 to 2 seconds of generalized EEG attenuation.  Hyperventilation and photic stimulation were not performed.    ?  ?Of note, study was technically difficult due to significant myogenic artifact. ?  ?ABNORMALITY ?- Burst suppression, generalized ?  ?IMPRESSION: ?This technically difficult study was initially suggestive of profound diffuse encephalopathy.  Gradually, EEG improved and was suggestive of severe diffuse encephalopathy, nonspecific etiology. No seizures or definite epileptiform discharges were seen throughout the recording. ?  ?Lora Havens  ?  ?

## 2021-10-05 NOTE — Progress Notes (Signed)
Pt transported by RT and RN from 3M04 to CT 2 and back w/no complications. ?

## 2021-10-06 DIAGNOSIS — R4182 Altered mental status, unspecified: Secondary | ICD-10-CM | POA: Diagnosis not present

## 2021-10-06 DIAGNOSIS — R569 Unspecified convulsions: Secondary | ICD-10-CM | POA: Diagnosis not present

## 2021-10-06 DIAGNOSIS — I6783 Posterior reversible encephalopathy syndrome: Secondary | ICD-10-CM | POA: Diagnosis not present

## 2021-10-06 LAB — PHOSPHORUS
Phosphorus: 4.7 mg/dL — ABNORMAL HIGH (ref 2.5–4.6)
Phosphorus: 5.7 mg/dL — ABNORMAL HIGH (ref 2.5–4.6)

## 2021-10-06 LAB — GLUCOSE, CAPILLARY
Glucose-Capillary: 160 mg/dL — ABNORMAL HIGH (ref 70–99)
Glucose-Capillary: 193 mg/dL — ABNORMAL HIGH (ref 70–99)
Glucose-Capillary: 202 mg/dL — ABNORMAL HIGH (ref 70–99)
Glucose-Capillary: 203 mg/dL — ABNORMAL HIGH (ref 70–99)
Glucose-Capillary: 248 mg/dL — ABNORMAL HIGH (ref 70–99)
Glucose-Capillary: 260 mg/dL — ABNORMAL HIGH (ref 70–99)

## 2021-10-06 LAB — CBC
HCT: 28.8 % — ABNORMAL LOW (ref 36.0–46.0)
Hemoglobin: 9.3 g/dL — ABNORMAL LOW (ref 12.0–15.0)
MCH: 33.8 pg (ref 26.0–34.0)
MCHC: 32.3 g/dL (ref 30.0–36.0)
MCV: 104.7 fL — ABNORMAL HIGH (ref 80.0–100.0)
Platelets: 207 10*3/uL (ref 150–400)
RBC: 2.75 MIL/uL — ABNORMAL LOW (ref 3.87–5.11)
RDW: 16.6 % — ABNORMAL HIGH (ref 11.5–15.5)
WBC: 9.7 10*3/uL (ref 4.0–10.5)
nRBC: 0 % (ref 0.0–0.2)

## 2021-10-06 LAB — MAGNESIUM
Magnesium: 1.7 mg/dL (ref 1.7–2.4)
Magnesium: 1.6 mg/dL — ABNORMAL LOW (ref 1.7–2.4)

## 2021-10-06 LAB — BASIC METABOLIC PANEL
Anion gap: 14 (ref 5–15)
BUN: 22 mg/dL — ABNORMAL HIGH (ref 6–20)
CO2: 26 mmol/L (ref 22–32)
Calcium: 7.8 mg/dL — ABNORMAL LOW (ref 8.9–10.3)
Chloride: 95 mmol/L — ABNORMAL LOW (ref 98–111)
Creatinine, Ser: 4 mg/dL — ABNORMAL HIGH (ref 0.44–1.00)
GFR, Estimated: 13 mL/min — ABNORMAL LOW (ref >60)
Glucose, Bld: 185 mg/dL — ABNORMAL HIGH (ref 70–99)
Potassium: 3.7 mmol/L (ref 3.5–5.1)
Sodium: 135 mmol/L (ref 135–145)

## 2021-10-06 LAB — HEPATITIS B SURFACE ANTIBODY, QUANTITATIVE: Hep B S AB Quant (Post): <3.1 m[IU]/mL — ABNORMAL LOW (ref >9.9)

## 2021-10-06 LAB — POTASSIUM
Potassium: 4.1 mmol/L (ref 3.5–5.1)
Potassium: 4.3 mmol/L (ref 3.5–5.1)
Potassium: 4.3 mmol/L (ref 3.5–5.1)

## 2021-10-06 MED ORDER — HYDRALAZINE HCL 20 MG/ML IJ SOLN
10.0000 mg | Freq: Four times a day (QID) | INTRAMUSCULAR | Status: DC | PRN
  Administered 2021-10-07 – 2021-10-20 (×10): 10 mg via INTRAVENOUS
  Filled 2021-10-06 (×12): qty 1

## 2021-10-06 MED ORDER — FOLIC ACID 1 MG PO TABS
1.0000 mg | ORAL_TABLET | Freq: Every day | ORAL | Status: DC
  Administered 2021-10-06 – 2021-11-01 (×27): 1 mg
  Filled 2021-10-06 (×28): qty 1

## 2021-10-06 MED ORDER — RENA-VITE PO TABS
1.0000 | ORAL_TABLET | Freq: Every day | ORAL | Status: DC
  Administered 2021-10-06 – 2021-11-01 (×27): 1
  Filled 2021-10-06 (×27): qty 1

## 2021-10-06 MED ORDER — PROSOURCE TF PO LIQD
45.0000 mL | Freq: Four times a day (QID) | ORAL | Status: DC
  Administered 2021-10-06 – 2021-10-23 (×70): 45 mL
  Filled 2021-10-06 (×72): qty 45

## 2021-10-06 MED ORDER — SODIUM CHLORIDE 0.9 % IV SOLN
750.0000 mg | Freq: Two times a day (BID) | INTRAVENOUS | Status: DC
  Administered 2021-10-06 – 2021-10-10 (×9): 750 mg via INTRAVENOUS
  Filled 2021-10-06 (×11): qty 7.5

## 2021-10-06 MED ORDER — LEVETIRACETAM IN NACL 1000 MG/100ML IV SOLN
1000.0000 mg | Freq: Once | INTRAVENOUS | Status: AC
  Administered 2021-10-06: 1000 mg via INTRAVENOUS
  Filled 2021-10-06: qty 100

## 2021-10-06 MED ORDER — VITAL 1.5 CAL PO LIQD
1000.0000 mL | ORAL | Status: DC
  Administered 2021-10-06 – 2021-10-24 (×16): 1000 mL
  Filled 2021-10-06 (×4): qty 1000

## 2021-10-06 NOTE — Progress Notes (Signed)
Admit: 10/06/2021 ?LOS: 2 ? ?15F ESRD found unresponsive at home, mild hyperkalemia ? ?Subjective:  ?Brain MRI with PRES ?Still poor neuro exam but on PSV ?HD yesterday: 2L UF, K improved to 4.1 today ? ?03/30 0701 - 03/31 0700 ?In: 2255.3 [I.V.:157.2; NG/GT:1523.3; IV Piggyback:574.7] ?Out: 2125 [Urine:125] ? ?Filed Weights  ? 10/05/21 1558 10/05/21 1855 10/06/21 0500  ?Weight: 84 kg 82 kg 81.1 kg  ? ? ?Scheduled Meds: ? aspirin  81 mg Per Tube Daily  ? atorvastatin  80 mg Per Tube q1800  ? carvedilol  12.5 mg Per Tube BID WC  ? chlorhexidine gluconate (MEDLINE KIT)  15 mL Mouth Rinse BID  ? Chlorhexidine Gluconate Cloth  6 each Topical Q0600  ? docusate  100 mg Per Tube BID  ? ezetimibe  10 mg Per Tube Daily  ? feeding supplement (PROSource TF)  45 mL Per Tube QID  ? folic acid  1 mg Per Tube Daily  ? heparin injection (subcutaneous)  5,000 Units Subcutaneous Q8H  ? insulin aspart  0-6 Units Subcutaneous Q4H  ? leptospermum manuka honey  1 application. Topical UD  ? levothyroxine  50 mcg Per Tube QAC breakfast  ? mouth rinse  15 mL Mouth Rinse 10 times per day  ? multivitamin  1 tablet Per Tube QHS  ? pantoprazole sodium  40 mg Per Tube Daily  ? polyethylene glycol  17 g Per Tube Daily  ? [START ON 10/13/2021] thiamine injection  100 mg Intravenous Daily  ? ?Continuous Infusions: ? sodium chloride 10 mL/hr at 09/19/2021 1715  ? sodium chloride    ? sodium chloride    ? sodium chloride    ? cefTRIAXone (ROCEPHIN)  IV Stopped (10/06/21 0850)  ? feeding supplement (VITAL 1.5 CAL) 1,000 mL (10/06/21 1200)  ? levETIRAcetam    ? thiamine injection Stopped (10/06/21 0706)  ? Followed by  ? [START ON 10/07/2021] thiamine injection    ? ?PRN Meds:.sodium chloride, sodium chloride, acetaminophen, fentaNYL (SUBLIMAZE) injection, heparin, hydrALAZINE, LORazepam, midazolam ? ?Current Labs: reviewed  ? ? ?Physical Exam:  Blood pressure (!) 105/95, pulse 84, temperature 99.6 ?F (37.6 ?C), temperature source Oral, resp. rate 19, height  '5\' 3"'  (1.6 m), weight 81.1 kg, last menstrual period 12/17/2016, SpO2 100 %. ?GEN: Intubated, comatose ?ENT: ET tube in oropharynx ?EYES: Eyes closed ?CV: Regular, normal S1 and S2 ?PULM: Coarse breath sounds bilaterally ?ABD: Soft ?SKIN: No rashes or lesions ?EXT: 2+ edema ?VASCULAR: Left upper arm AV fistula with bruit and thrill ? ?A ?ESRD TTS [facility] LUE AVF on schedule ?Mild hyperkalemia, resolved ?PRES: Found unresponsive at home / AMS, neuro following,  ?VDRF, airway protection ?DM2 ?? Sepsis started on Ceftriaxone ?Anemia, Hb  stable CTM ?CKD-BMM: Ca stable, trend P ? ?P ?HD on THS schedule: 2K, AVF, no heparin, 400/600, 3.5h, 3LL UF  SBP > 105 ?Medication Issues; ?Preferred narcotic agents for pain control are hydromorphone, fentanyl, and methadone. Morphine should not be used.  ?Baclofen should be avoided ?Avoid oral sodium phosphate and magnesium citrate based laxatives / bowel preps  ? ? ?Pearson Grippe MD ?10/06/2021, 12:08 PM ? ?Recent Labs  ?Lab 09/24/2021 ?1838 10/05/21 ?1914 10/05/21 ?7829 10/05/21 ?1544 10/06/21 ?0154 10/06/21 ?5621  ?NA 139 136  --   --  135  --   ?K 5.7* 5.7*   < > 5.2* 3.7 4.1  ?CL 104 100  --   --  95*  --   ?CO2 22 22  --   --  26  --   ?GLUCOSE 61* 180*  --   --  185*  --   ?BUN 39* 41*  --   --  22*  --   ?CREATININE 5.44* 5.87*  --   --  4.00*  --   ?CALCIUM 8.1* 8.2*  --   --  7.8*  --   ?PHOS  --   --   --  6.7* 4.7*  --   ? < > = values in this interval not displayed.  ? ? ?Recent Labs  ?Lab 09/07/2021 ?1021 10/05/21 ?6016 10/06/21 ?5800  ?WBC 11.3* 7.7 9.7  ?NEUTROABS 9.4*  --   --   ?HGB 11.2* 10.2* 9.3*  ?HCT 36.1 30.7* 28.8*  ?MCV 110.4* 105.1* 104.7*  ?PLT 229 199 207  ? ? ? ? ? ? ? ? ? ? ?  ?

## 2021-10-06 NOTE — Progress Notes (Addendum)
? ?NAME:  Melanie Hall, MRN:  [id number], DOB:  1971-04-14, LOS: 2 ?ADMISSION DATE:  09/29/2021, CONSULTATION DATE:  09/20/2021 ?REFERRING MD:  Dr. Roderic Palau, ER, CHIEF COMPLAINT:  AMS  ? ?History of Present Illness:  ?51 yo female former smoker with hx of ESRD on HD was found at her home by patient transport driver unresponsive.  Pt's neighbor noted that she was tapping her hands for about a minute, and then stopped.  She has snoring and drooling.  No improvement after narcan.  CBG normal.  Noted to have BP 215/96.  Intubated in [facility] for airway protection, given keppra load, started on diprivan, and started on antibiotics.  CT head negative for acute findings.  Treated for hyperkalemia.  Pt's daughter report she had similar episode on 09/24/21 and EMS was called to the house, but episode resolve and patient declined hospital evaluation.  No prior history of seizures or known head injury.  ER team d/w case with neurology and advised to transfer to [facility] for EEG monitoring and further neurology assessment. ? ?Hx from medical team, patients daughter at bedside, and medical records. ? ?Pertinent  Medical History  ?CHF, Anemia, OA, CAD, DM type 2, ESRD on HD, HLD, HTN, CVA ? ?Significant Hospital Events: ?Including procedures, antibiotic start and stop dates in addition to other pertinent events   ?3/29 present to [facility], intubated, transfer to [facility] ?3/30 MRI Brain consistent with PRES ? ?Interim History / Subjective:  ? ?Weaned off sedation since yesterday morning and remains unresponsive. MR brain consistent with PRES ?Minimal vent settings ?S/p HD yesterday ? ?Boyfriend previously reported patient was in her normal health on Tuesday ? ?Objective   ?Blood pressure (!) 144/73, pulse 97, temperature 99.4 ?F (37.4 ?C), temperature source Oral, resp. rate 17, height '5\' 3"'$  (1.6 m), weight 81.1 kg, last menstrual period 12/17/2016, SpO2 100 %. ?   ?Vent Mode: PRVC ?FiO2 (%):  [40 %] 40 % ?Set Rate:  [18 bmp] 18 bmp ?Vt Set:  [420 mL]  420 mL ?PEEP:  [5 cmH20] 5 cmH20 ?Pressure Support:  [8 cmH20] 8 cmH20 ?Plateau Pressure:  [14 cmH20-17 cmH20] 17 cmH20  ? ?Intake/Output Summary (Last 24 hours) at 10/06/2021 0748 ?Last data filed at 10/06/2021 0600 ?Gross per 24 hour  ?Intake 2215.28 ml  ?Output 2100 ml  ?Net 115.28 ml  ? ?Filed Weights  ? 10/05/21 1558 10/05/21 1855 10/06/21 0500  ?Weight: 84 kg 82 kg 81.1 kg  ? ?Physical Exam: ?General: Chronically ill-appearing, no acute distress, unresponsive ?HENT: [place], AT, ETT in place ?Eyes: EOMI, no scleral icterus ?Respiratory: Clear to auscultation bilaterally.  No crackles, wheezing or rales ?Cardiovascular: RRR, -M/R/G, no JVD ?GI: BS+, soft, nontender ?Extremities: Upper extremity edema bilaterally, L AVF ?Neuro: Unresponsive, no withdrawal to noxious stimuli. Pupils 52m and reactive bilaterally ?Skin: Intact, no rashes or bruising ?Psych: Normal mood, normal affect ?GU: [place] in place ? ?Resolved Hospital Problem list   ? ? ?Assessment & Plan:  ?Acute metabolic encephalopathy 2/2 PRES ?MRI brain consistent with PRES. No seizures seen on spot EEG but showed cortical dysfunction from the left hemisphere.  CT head did not show any acute intracranial hemorrhage/mass/lesion.  No dural sinus thrombus. There are mild stenosis of the left ICA but should not cause her issue. CVA should not cause this extent of encephalopathy and a large stroke should appear on CT. ?- Off sedation. If needed for vent dyssynchrony, add Precedex ?- Target SBP <140 and DBP < 100 ?- EEG/LTM per Neuro ?-  Keppra BID and PRN ativan for seizure precautions ?- Hold sedating meds including outpt neurontin, robaxin, tramadol ?- Thiamine, folic acid, MV ? ?Acute respiratory failure secondary to probable aspiration PNA ?- Full vent support ?- Daily WUA/SBT. Not a candidate for extubation due to PRES ?- On minimal setting  ?- VAP bundle ?- Cont Ceftriaxone  (allergic to penicillin) for total 5 days ? ?ESRD on iHD. ?Hyperkalemia - improved ?-  S/p lokelma x 2, HD 3/30 ?- Nephrology following ? ?Hx of HTN, systolic CHF, CAD, HLD. ?- continue ASA, lipitor, coreg, zetia ?- hold outpt hydralazine, imdur for now ?- Target SBP <140 and DBP < 100 for PRES ? ?DM type 2 poorly controlled with hyperglycemia. ?- SSI ? ?Hx of hypothyroidism. ?TSH WNL ?- continue synthroid ? ?Stage 1 sacral pressure injury and deep tissue pressure injury right Toe, present on admission  ? ?Best Practice (right click and "Reselect all SmartList Selections" daily)  ? ?Diet/type: tubefeeds ?DVT prophylaxis: prophylactic heparin  ?GI prophylaxis: PPI ?Lines: N/A ?Foley:  N/A ?Code Status:  full code ?Last date of multidisciplinary goals of care discussion [3/31] Updated daughter, [name], on patient's critical condition. Remains full code ?Labs   ?CBC: ?Recent Labs  ?Lab 09/30/2021 ?1021 10/05/21 ?7793 10/06/21 ?9030  ?WBC 11.3* 7.7 9.7  ?NEUTROABS 9.4*  --   --   ?HGB 11.2* 10.2* 9.3*  ?HCT 36.1 30.7* 28.8*  ?MCV 110.4* 105.1* 104.7*  ?PLT 229 199 207  ? ? ?Basic Metabolic Panel: ?Recent Labs  ?Lab 09/23/2021 ?1021 09/16/2021 ?1236 09/30/2021 ?1838 10/05/21 ?0923 10/05/21 ?0709 10/05/21 ?1006 10/05/21 ?1544 10/06/21 ?0154 10/06/21 ?3007  ?NA 136  --  139 136  --   --   --  135  --   ?K 6.3*   < > 5.7* 5.7* 6.5* 6.1* 5.2* 3.7 4.1  ?CL 102  --  104 100  --   --   --  95*  --   ?CO2 24  --  22 22  --   --   --  26  --   ?GLUCOSE 137*  --  61* 180*  --   --   --  185*  --   ?BUN 40*  --  39* 41*  --   --   --  22*  --   ?CREATININE 5.29*  --  5.44* 5.87*  --   --   --  4.00*  --   ?CALCIUM 8.3*  --  8.1* 8.2*  --   --   --  7.8*  --   ?MG  --   --   --   --   --   --  1.7 1.7  --   ?PHOS  --   --   --   --   --   --  6.7* 4.7*  --   ? < > = values in this interval not displayed.  ? ?GFR: ?Estimated Creatinine Clearance: 16.8 mL/min (A) (by C-G formula based on SCr of 4 mg/dL (H)). ?Recent Labs  ?Lab 09/21/2021 ?1021 09/11/2021 ?1235 10/05/21 ?6226 10/06/21 ?3335  ?WBC 11.3*  --  7.7 9.7  ?LATICACIDVEN  1.5 1.2  --   --   ? ? ?Liver Function Tests: ?Recent Labs  ?Lab 09/19/2021 ?1021  ?AST 19  ?ALT 12  ?ALKPHOS 144*  ?BILITOT 0.9  ?PROT 7.6  ?ALBUMIN 3.6  ? ?No results for input(s): LIPASE, AMYLASE in the last 168 hours. ?Recent Labs  ?Lab 09/07/2021 ?2132  ?  AMMONIA 42*  ? ? ?ABG ?   ?Component Value Date/Time  ? PHART 7.42 09/22/2021 1128  ? PCO2ART 36 09/30/2021 1128  ? PO2ART 465 (H) 09/20/2021 1128  ? HCO3 23.2 09/19/2021 1128  ? TCO2 28 02/28/2021 0928  ? ACIDBASEDEF 0.8 09/22/2021 1128  ? O2SAT 99.4 09/27/2021 1128  ?  ? ?Coagulation Profile: ?Recent Labs  ?Lab 09/15/2021 ?1021  ?INR 1.1  ? ? ?Cardiac Enzymes: ?No results for input(s): CKTOTAL, CKMB, CKMBINDEX, TROPONINI in the last 168 hours. ? ?HbA1C: ?Hgb A1c MFr Bld  ?Date/Time Value Ref Range Status  ?09/20/2021 09:18 AM 10.5 (H) 4.8 - 5.6 % Final  ?  Comment:  ?           Prediabetes: 5.7 - 6.4 ?         Diabetes: >6.4 ?         Glycemic control for adults with diabetes: <7.0 ?  ?02/14/2021 08:58 AM 8.7 (H) 4.8 - 5.6 % Final  ?  Comment:  ?           Prediabetes: 5.7 - 6.4 ?         Diabetes: >6.4 ?         Glycemic control for adults with diabetes: <7.0 ?  ? ? ?CBG: ?Recent Labs  ?Lab 10/05/21 ?1154 10/05/21 ?1547 10/05/21 ?1497 10/05/21 ?2305 10/06/21 ?0309  ?GLUCAP 201* 121* 96 137* 193*  ? ? ?Review of Systems:   ?Unable to obtain. ? ?Past Medical History:  ?She,  has a past medical history of Anemia, Arthritis, CAD (coronary artery disease), Cataracts, bilateral, CATARACTS, BILATERAL (07/02/2007), Closed fracture of left femur ([place]) (10/28/2018), Closed fracture of right ankle (11/06/2017), Diabetes mellitus, Emphysematous cystitis (08/26/2018), ESRD on hemodialysis ([place]), GERD (gastroesophageal reflux disease), Hyperlipidemia, Hypertension, IRREGULAR MENSES (09/14/2009), Ischemic cardiomyopathy, PARONYCHIA, RIGHT GREAT TOE (07/30/2008), Pressure ulcer (09/10/2019), Right arm weakness (08/08/2019), STEMI involving right coronary artery ([place])  (10/18/2019), Stroke ([place]) (10/2019), SVD (spontaneous vaginal delivery), Vaginosis (08/26/2018), and Weakness of both lower extremities.  ? ?Surgical History:  ? ?Past Surgical History:  ?Procedure Laterality Date

## 2021-10-06 NOTE — Progress Notes (Signed)
Pt receives out-pt HD at [facility] on TTS. Pt has a 6:30 chair time. Will assist as needed. ? ?Melven Sartorius ?Renal Navigator ?[phone] ?

## 2021-10-06 NOTE — Telephone Encounter (Signed)
Pt is currently admitted

## 2021-10-06 NOTE — Progress Notes (Signed)
Initial Nutrition Assessment ? ?DOCUMENTATION CODES:  ? ?Not applicable ? ?INTERVENTION:  ? ?Tube feeding via OG tube: ?- Change to Vital 1.5 @ 50 ml/hr (1200 ml/day) ?- ProSource TF 45 ml QID ? ?Tube feeding regimen provides 1960 kcal, 125 grams of protein, and 917 ml of H2O. ? ?- Continue renal MVI daily per tube ? ?NUTRITION DIAGNOSIS:  ? ?Inadequate oral intake related to inability to eat as evidenced by NPO status. ? ?GOAL:  ? ?Patient will meet greater than or equal to 90% of their needs ? ?MONITOR:  ? ?Vent status, Labs, Weight trends, TF tolerance, Skin, I & O's ? ?REASON FOR ASSESSMENT:  ? ?Ventilator, Consult ?Enteral/tube feeding initiation and management ? ?ASSESSMENT:  ? ?51 year old female who presented to the ED on 3/29 with seizure-like activity. PMH of ESRD on HD, CHF, anemia, CAD, T2DM, HLD, HTN, CVA, GERD. Pt required intubation for airway protection. Pt admitted with acute metabolic encephalopathy, probable aspiration PNA. ? ?03/30 - tube feeding protocol started ? ?Discussed pt with RN and during ICU rounds. MRI brain on 3/30 consistent with PRES. Pt unresponsive off all sedation. Pt with OG tube in place and tube feeds currently infusing per protocol. RD to adjust to better meet pt's estimated needs. ? ?Unable to obtain diet and weight history at this time. Reviewed weight history in chart. Pt's weight is currently up compared to weights from 2022. Suspect weight gain is related to fluid status and volume overload. ? ?Pt's last HD was yesterday with 2000 ml net UF. ? ?EDW: 75.5 kg ?Admit weight: 83.9 kg ?Current weight: 81.1 kg ? ?Patient is currently intubated on ventilator support ?MV: 10.1 L/min ?Temp (24hrs), Avg:99.4 ?F (37.4 ?C), Min:98.3 ?F (36.8 ?C), Max:100.1 ?F (37.8 ?C) ? ?Medications reviewed and include: colace, folic acid, SSI q 4 hours, rena-vit, protonix, miralax, thiamine, IV abx ? ?Labs reviewed: BUN 22, creatinine 4.00, phosphorus 4.7, hemoglobin 9.3 ?CBG's: 96-202 x 24  hours ? ?UOP: 125 ml x 24 hours ?I/O's: +3.6 L since admit ? ?NUTRITION - FOCUSED PHYSICAL EXAM: ? ?Flowsheet Row Most Recent Value  ?Orbital Region No depletion  ?Upper Arm Region No depletion  ?Thoracic and Lumbar Region No depletion  ?Buccal Region Unable to assess  ?Temple Region No depletion  ?Clavicle Bone Region Mild depletion  ?Clavicle and Acromion Bone Region Mild depletion  ?Scapular Bone Region Unable to assess  ?Dorsal Hand No depletion  ?Patellar Region Mild depletion  ?Anterior Thigh Region Mild depletion  ?Posterior Calf Region Mild depletion  ?Edema (RD Assessment) Moderate  [BUE]  ?Hair Reviewed  ?Eyes Reviewed  ?Mouth Unable to assess  ?Skin Reviewed  ?Nails Reviewed  ? ?  ? ? ?Diet Order:   ?Diet Order   ? ?       ?  Diet NPO time specified  Diet effective now       ?  ? ?  ?  ? ?  ? ? ?EDUCATION NEEDS:  ? ?Not appropriate for education at this time ? ?Skin:  Skin Assessment: ?Skin Integrity Issues: ?DTI: R foot toe ?Stage I: sacrum ?Other: MASD pelvis ? ?Last BM:  10/06/21 type 6 x 3 ? ?Height:  ? ?Ht Readings from Last 1 Encounters:  ?09/12/2021 '5\' 3"'$  (1.6 m)  ? ? ?Weight:  ? ?Wt Readings from Last 1 Encounters:  ?10/06/21 81.1 kg  ? ? ?Ideal Body Weight:  52.3 kg ? ?BMI:  Body mass index is 31.67 kg/m?. ? ?Estimated Nutritional Needs:  ? ?Kcal:  7824-2353 ? ?Protein:  115-135 grams ? ?Fluid:  1000 ml + UOP ? ? ? ?Gustavus Bryant, MS, RD, LDN ?Inpatient Clinical Dietitian ?Please see AMiON for contact information. ? ?

## 2021-10-06 NOTE — Progress Notes (Signed)
Subjective: Continues to be comatose, on pressor support ? ?ROS: Unable to obtain due to poor mental status ? ?Examination ? ?Vital signs in last 24 hours: ?Temp:  [98.3 ?F (36.8 ?C)-100.1 ?F (37.8 ?C)] 98.3 ?F (36.8 ?C) (03/31 0804) ?Pulse Rate:  [83-104] 87 (03/31 1015) ?Resp:  [12-33] 17 (03/31 1015) ?BP: (84-153)/(56-124) 135/61 (03/31 1015) ?SpO2:  [99 %-100 %] 100 % (03/31 1015) ?FiO2 (%):  [40 %] 40 % (03/31 0800) ?Weight:  [81.1 kg-84 kg] 81.1 kg (03/31 0500) ? ?General: lying in bed, NAD ?Neuro: Does not open eyes to noxious stimuli, left pupil does not appear to be reacting to light, right pupil sluggishly reacting to light, downward gaze deviation, corneal reflex absent, subtle withdrawal to noxious stimuli in all extremities ? ?Basic Metabolic Panel: ?Recent Labs  ?Lab 09/23/2021 ?1021 09/08/2021 ?1236 10/06/2021 ?1838 10/05/21 ?1856 10/05/21 ?0709 10/05/21 ?1006 10/05/21 ?1544 10/06/21 ?0154 10/06/21 ?3149  ?NA 136  --  139 136  --   --   --  135  --   ?K 6.3*   < > 5.7* 5.7* 6.5* 6.1* 5.2* 3.7 4.1  ?CL 102  --  104 100  --   --   --  95*  --   ?CO2 24  --  22 22  --   --   --  26  --   ?GLUCOSE 137*  --  61* 180*  --   --   --  185*  --   ?BUN 40*  --  39* 41*  --   --   --  22*  --   ?CREATININE 5.29*  --  5.44* 5.87*  --   --   --  4.00*  --   ?CALCIUM 8.3*  --  8.1* 8.2*  --   --   --  7.8*  --   ?MG  --   --   --   --   --   --  1.7 1.7  --   ?PHOS  --   --   --   --   --   --  6.7* 4.7*  --   ? < > = values in this interval not displayed.  ? ? ?CBC: ?Recent Labs  ?Lab 09/10/2021 ?1021 10/05/21 ?7026 10/06/21 ?3785  ?WBC 11.3* 7.7 9.7  ?NEUTROABS 9.4*  --   --   ?HGB 11.2* 10.2* 9.3*  ?HCT 36.1 30.7* 28.8*  ?MCV 110.4* 105.1* 104.7*  ?PLT 229 199 207  ? ? ? ?Coagulation Studies: ?Recent Labs  ?  09/07/2021 ?1021  ?LABPROT 13.7  ?INR 1.1  ? ? ?Imaging ? ?MR Brain  wo contrast 10/05/2021: Increased T2 hyperintensities within the posterior temporal, parietal, and occipital cortex bilaterally with mild  restricted ?diffusion. Findings are most consistent with posterior reversible ?encephalopathy syndrome.No other acute intracranial abnormality. T2 hyperintensities within the central pons bilaterally likely reflect the sequela of chronic microvascular ischemia. Bilateral mastoid effusions. No obstructing nasopharyngeal lesion is present. Left maxillary, posterior ethmoid, and left sphenoid sinus disease. ? ? ? ?ASSESSMENT AND PLAN: 51 year old female with seizures and altered mental status in the setting of systolic blood pressure 885 which has since improved. ?  ?Seizure ?Posterior reversible encephalopathy syndrome (PRES) ?Acute encephalopathy due to PRES, seizures ?-No definite seizures overnight. EEG showing generalized periodic discharges at 2 Hz which is on the ictal-interictal continuum-Provoked seizures due to PRES ?- Encephalopathy appears to be out of proportion to imaging findings, drug screen pending ?  ?Recommendations ?-Increase  Keppra to 750 mg twice daily, cant increase more due to renal dysfunction ?- Can add vimpat if any more seizures ?-Continue LTM EEG to look for intermittent seizures ?-Continue seizure precautions ?-As needed IV Ativan 2 mg for clinical seizure-like activity ?-Management of rest of comorbidities per primary team ? ?I have spent a total of  35 minutes with the patient reviewing hospital notes,  test results, labs and examining the patient as well as establishing an assessment and plan..  > 50% of time was spent in direct patient care. ?  ?Zeb Comfort ?Epilepsy ?[facility] ?For questions after 5pm please refer to AMION to reach the Neurologist on call ? ?

## 2021-10-06 NOTE — Progress Notes (Signed)
LTM EEG hooked up on 3/29 - no initial skin breakdown  ?

## 2021-10-06 NOTE — Procedures (Addendum)
Patient Name: Melanie Hall  ?MRN: [id number]  ?Epilepsy Attending: Lora Havens  ?Referring Physician/Provider: Derek Jack, MD ?Duration: 10/05/2021 1722 to 10/06/2021 1722 ?  ?Patient history: 51yo F with ams. EEG to evaluate for seizure ?  ?Level of alertness:  comatose ?  ?AEDs during EEG study: LEV ?  ?Technical aspects: This EEG study was done with scalp electrodes positioned according to the 10-20 International system of electrode placement. Electrical activity was acquired at a sampling rate of '500Hz'$  and reviewed with a high frequency filter of '70Hz'$  and a low frequency filter of '1Hz'$ . EEG data were recorded continuously and digitally stored.  ?  ?Description:  EEG showed continuous generalized 3-7 hz theta-delta slowing.  Generalized periodic discharges with triphasic morphology were also noted at 2 Hz, more prominent when awake/stimulated.  Hyperventilation and photic stimulation were not performed.    ?  ?ABNORMALITY ?- Periodic discharges with triphasic morphology, generalized ?- Continuous slow, generalized ?  ?IMPRESSION: ?This study showed generalized periodic discharges with triphasic morphology which is on the ictal-interictal continuum with high potential for seizures.  Additionally there is severe diffuse encephalopathy, nonspecific etiology.  No definite seizures were seen during the study. ?  ?Lora Havens  ?  ?

## 2021-10-07 DIAGNOSIS — R4182 Altered mental status, unspecified: Secondary | ICD-10-CM | POA: Diagnosis not present

## 2021-10-07 DIAGNOSIS — R569 Unspecified convulsions: Secondary | ICD-10-CM | POA: Diagnosis not present

## 2021-10-07 LAB — BASIC METABOLIC PANEL
Anion gap: 15 (ref 5–15)
BUN: 47 mg/dL — ABNORMAL HIGH (ref 6–20)
CO2: 23 mmol/L (ref 22–32)
Calcium: 7.4 mg/dL — ABNORMAL LOW (ref 8.9–10.3)
Chloride: 98 mmol/L (ref 98–111)
Creatinine, Ser: 5.47 mg/dL — ABNORMAL HIGH (ref 0.44–1.00)
GFR, Estimated: 9 mL/min — ABNORMAL LOW (ref >60)
Glucose, Bld: 239 mg/dL — ABNORMAL HIGH (ref 70–99)
Potassium: 4.2 mmol/L (ref 3.5–5.1)
Sodium: 136 mmol/L (ref 135–145)

## 2021-10-07 LAB — CBC
HCT: 28 % — ABNORMAL LOW (ref 36.0–46.0)
Hemoglobin: 9.3 g/dL — ABNORMAL LOW (ref 12.0–15.0)
MCH: 35.1 pg — ABNORMAL HIGH (ref 26.0–34.0)
MCHC: 33.2 g/dL (ref 30.0–36.0)
MCV: 105.7 fL — ABNORMAL HIGH (ref 80.0–100.0)
Platelets: 184 10*3/uL (ref 150–400)
RBC: 2.65 MIL/uL — ABNORMAL LOW (ref 3.87–5.11)
RDW: 16.9 % — ABNORMAL HIGH (ref 11.5–15.5)
WBC: 9.2 10*3/uL (ref 4.0–10.5)
nRBC: 0 % (ref 0.0–0.2)

## 2021-10-07 LAB — GLUCOSE, CAPILLARY
Glucose-Capillary: 175 mg/dL — ABNORMAL HIGH (ref 70–99)
Glucose-Capillary: 188 mg/dL — ABNORMAL HIGH (ref 70–99)
Glucose-Capillary: 230 mg/dL — ABNORMAL HIGH (ref 70–99)
Glucose-Capillary: 264 mg/dL — ABNORMAL HIGH (ref 70–99)
Glucose-Capillary: 293 mg/dL — ABNORMAL HIGH (ref 70–99)
Glucose-Capillary: 306 mg/dL — ABNORMAL HIGH (ref 70–99)

## 2021-10-07 LAB — PHOSPHORUS: Phosphorus: 5.9 mg/dL — ABNORMAL HIGH (ref 2.5–4.6)

## 2021-10-07 LAB — MAGNESIUM: Magnesium: 1.8 mg/dL (ref 1.7–2.4)

## 2021-10-07 MED ORDER — THIAMINE HCL 100 MG/ML IJ SOLN
250.0000 mg | Freq: Every day | INTRAVENOUS | Status: AC
  Administered 2021-10-07 – 2021-10-11 (×5): 250 mg via INTRAVENOUS
  Filled 2021-10-07 (×5): qty 2.5

## 2021-10-07 MED ORDER — THIAMINE HCL 100 MG/ML IJ SOLN
100.0000 mg | Freq: Every day | INTRAMUSCULAR | Status: DC
Start: 2021-10-12 — End: 2021-10-12
  Administered 2021-10-12: 100 mg via INTRAVENOUS
  Filled 2021-10-07 (×2): qty 2

## 2021-10-07 MED ORDER — INSULIN GLARGINE-YFGN 100 UNIT/ML ~~LOC~~ SOLN
5.0000 [IU] | Freq: Every day | SUBCUTANEOUS | Status: DC
  Administered 2021-10-07 – 2021-10-08 (×2): 5 [IU] via SUBCUTANEOUS
  Filled 2021-10-07 (×3): qty 0.05

## 2021-10-07 NOTE — Procedures (Addendum)
Patient Name: BRIANNE MAINA  ?MRN: [id number]  ?Epilepsy Attending: Lora Havens  ?Referring Physician/Provider: Derek Jack, MD ?Duration: 10/06/2021 1722 to 10/07/2021 1722 ?  ?Patient history: 51yo F with ams. EEG to evaluate for seizure ?  ?Level of alertness:  comatose ?  ?AEDs during EEG study: LEV ?  ?Technical aspects: This EEG study was done with scalp electrodes positioned according to the 10-20 International system of electrode placement. Electrical activity was acquired at a sampling rate of '500Hz'$  and reviewed with a high frequency filter of '70Hz'$  and a low frequency filter of '1Hz'$ . EEG data were recorded continuously and digitally stored.  ?  ?Description:  EEG showed continuous generalized 3-7 hz theta-delta slowing.  Generalized periodic discharges with triphasic morphology were also noted at 2-2.5 Hz, more prominent when awake/stimulated.  Hyperventilation and photic stimulation were not performed.    ?  ?ABNORMALITY ?- Periodic discharges with triphasic morphology, generalized ?- Continuous slow, generalized ?  ?IMPRESSION: ?This study showed generalized periodic discharges with triphasic morphology which is on the ictal-interictal continuum with high potential for seizures. Additionally there is severe diffuse encephalopathy, nonspecific etiology.  No definite seizures were seen during the study. ?  ?Lora Havens  ?

## 2021-10-07 NOTE — Progress Notes (Signed)
Subjective: Continues to be comatose, on pressor support ? ?ROS: Unable to obtain due to poor mental status ? ?Overnight EEG showed continuous generalized 3-7 hz theta-delta slowing.  Generalized periodic discharges with triphasic morphology were also noted at 2-2.5 Hz, more prominent when awake/stimulated.   ? ?Examination ? ?Vital signs in last 24 hours: ?Temp:  [98.5 ?F (36.9 ?C)-100.5 ?F (38.1 ?C)] 99.7 ?F (37.6 ?C) (04/01 1527) ?Pulse Rate:  [76-99] 99 (04/01 1600) ?Resp:  [14-19] 16 (04/01 1600) ?BP: (107-152)/(49-74) 127/51 (04/01 1600) ?SpO2:  [98 %-100 %] 100 % (04/01 1600) ?FiO2 (%):  [40 %] 40 % (04/01 1515) ?Weight:  [81.8 kg] 81.8 kg (04/01 0500) ? ?Patient not examined today 2/2 being set up for HD. See most recent exam from neuro note yesterday.  ? ?Basic Metabolic Panel: ?Recent Labs  ?Lab 09/16/2021 ?1021 09/13/2021 ?1236 09/06/2021 ?1838 10/05/21 ?2841 10/05/21 ?3244 10/05/21 ?1544 10/06/21 ?0154 10/06/21 ?0102 10/06/21 ?1439 10/06/21 ?1909 10/07/21 ?0440  ?NA 136  --  139 136  --   --  135  --   --   --  136  ?K 6.3*   < > 5.7* 5.7*   < > 5.2* 3.7 4.1 4.3 4.3 4.2  ?CL 102  --  104 100  --   --  95*  --   --   --  98  ?CO2 24  --  22 22  --   --  26  --   --   --  23  ?GLUCOSE 137*  --  61* 180*  --   --  185*  --   --   --  239*  ?BUN 40*  --  39* 41*  --   --  22*  --   --   --  47*  ?CREATININE 5.29*  --  5.44* 5.87*  --   --  4.00*  --   --   --  5.47*  ?CALCIUM 8.3*  --  8.1* 8.2*  --   --  7.8*  --   --   --  7.4*  ?MG  --   --   --   --   --  1.7 1.7  --   --  1.6* 1.8  ?PHOS  --   --   --   --   --  6.7* 4.7*  --   --  5.7* 5.9*  ? < > = values in this interval not displayed.  ? ? ? ?CBC: ?Recent Labs  ?Lab 09/14/2021 ?1021 10/05/21 ?7253 10/06/21 ?6644 10/07/21 ?0440  ?WBC 11.3* 7.7 9.7 9.2  ?NEUTROABS 9.4*  --   --   --   ?HGB 11.2* 10.2* 9.3* 9.3*  ?HCT 36.1 30.7* 28.8* 28.0*  ?MCV 110.4* 105.1* 104.7* 105.7*  ?PLT 229 199 207 184  ? ? ? ? ?Coagulation Studies: ?No results for input(s): LABPROT,  INR in the last 72 hours. ? ? ?Imaging ? ?MR Brain  wo contrast 10/05/2021: Increased T2 hyperintensities within the posterior temporal, parietal, and occipital cortex bilaterally with mild restricted ?diffusion. Findings are most consistent with posterior reversible ?encephalopathy syndrome.No other acute intracranial abnormality. T2 hyperintensities within the central pons bilaterally likely reflect the sequela of chronic microvascular ischemia. Bilateral mastoid effusions. No obstructing nasopharyngeal lesion is present. Left maxillary, posterior ethmoid, and left sphenoid sinus disease. ? ? ? ?ASSESSMENT AND PLAN: 51 year old female with seizures and altered mental status in the setting of systolic blood pressure 034 which has since improved. ?  ?  Seizure ?Posterior reversible encephalopathy syndrome (PRES) ?Acute encephalopathy due to PRES, seizures ?-No definite seizures overnight. EEG showing generalized periodic discharges at 2-2.5 Hz w/ triphasic morphology which is on the ictal-interictal continuum-Provoked seizures due to PRES ?- Encephalopathy appears to be out of proportion to imaging findings, drug screen pending ?  ?Recommendations ?- Continue Keppra to 750 mg twice daily, cant increase more due to renal dysfunction ?- Can add vimpat if any more seizures ?-Continue LTM EEG to look for intermittent seizures ?-Continue seizure precautions ?-As needed IV Ativan 2 mg for clinical seizure-like activity ?-Management of rest of comorbidities per primary team ? ?Su Monks, MD ?[facility] ?[phone] ? ?If 7pm- 7am, please page neurology on call as listed in [place]. ? ?Note not billed for 2/2 patient unavailable for exam ?

## 2021-10-07 NOTE — Progress Notes (Signed)
? ?NAME:  Melanie Hall, MRN:  [id number], DOB:  June 28, 1971, LOS: 3 ?ADMISSION DATE:  09/10/2021, CONSULTATION DATE:  09/12/2021 ?REFERRING MD:  Dr. Roderic Palau, ER, CHIEF COMPLAINT:  AMS  ? ?History of Present Illness:  ?51 yo female former smoker with hx of ESRD on HD was found at her home by patient transport driver unresponsive.  Pt's neighbor noted that she was tapping her hands for about a minute, and then stopped.  She has snoring and drooling.  No improvement after narcan.  CBG normal.  Noted to have BP 215/96.  Intubated in [facility] for airway protection, given keppra load, started on diprivan, and started on antibiotics.  CT head negative for acute findings.  Treated for hyperkalemia.  Pt's daughter report she had similar episode on 09/24/21 and EMS was called to the house, but episode resolve and patient declined hospital evaluation.  No prior history of seizures or known head injury.  ER team d/w case with neurology and advised to transfer to [facility] for EEG monitoring and further neurology assessment. ? ?Hx from medical team, patients daughter at bedside, and medical records. ? ?Pertinent  Medical History  ?CHF, Anemia, OA, CAD, DM type 2, ESRD on HD, HLD, HTN, CVA ? ?Significant Hospital Events: ?Including procedures, antibiotic start and stop dates in addition to other pertinent events   ?3/29 present to [facility], intubated, transfer to [facility] ?3/30 MRI Brain consistent with PRES ? ?Interim History / Subjective:  ? ?Updated daughter and sisters yesterday. May be slow recovery. Planned for HD today ? ?Boyfriend previously reported patient was in her normal health on Tuesday ? ?Objective   ?Blood pressure 140/71, pulse 93, temperature 98.8 ?F (37.1 ?C), temperature source Oral, resp. rate 14, height '5\' 3"'$  (1.6 m), weight 81.8 kg, last menstrual period 12/17/2016, SpO2 100 %. ?   ?Vent Mode: PRVC ?FiO2 (%):  [40 %] 40 % ?Set Rate:  [18 bmp] 18 bmp ?Vt Set:  [420 mL] 420 mL ?PEEP:  [5 cmH20] 5 cmH20 ?Pressure Support:  [8 cmH20] 8  cmH20 ?Plateau Pressure:  [7 cmH20-16 cmH20] 7 cmH20  ? ?Intake/Output Summary (Last 24 hours) at 10/07/2021 0856 ?Last data filed at 10/07/2021 0600 ?Gross per 24 hour  ?Intake 1638 ml  ?Output 25 ml  ?Net 1613 ml  ? ?Filed Weights  ? 10/05/21 1855 10/06/21 0500 10/07/21 0500  ?Weight: 82 kg 81.1 kg 81.8 kg  ? ?Physical Exam: ?General: Chronically ill-appearing, no acute distress ?HENT: [place], AT, ETT in place ?Eyes: EOMI, no scleral icterus ?Respiratory: Clear to auscultation bilaterally.  No crackles, wheezing or rales ?Cardiovascular: RRR, -M/R/G, no JVD ?GI: BS+, soft, nontender ?Extremities:-Edema,-tenderness ?Neuro: Obtunded, PERRL, LE withdraws to noxious stimuli, upper extremity posturing ?GU: [place] in place ? ?Resolved Hospital Problem list   ? ? ?Assessment & Plan:  ?Posteriror reversible encephalopathy syndrome ?Acute metabolic encephalopathy 2/2 PRES ?Seizure ?MRI brain consistent with PRES. No seizures seen on spot EEG but showed cortical dysfunction from the left hemisphere.  CT head did not show any acute intracranial hemorrhage/mass/lesion.  No dural sinus thrombus. There are mild stenosis of the left ICA but should not cause her issue. CVA should not cause this extent of encephalopathy and a large stroke should appear on CT. ?- Off sedation. If needed for vent dyssynchrony, add Precedex ?- Target SBP <140 and DBP < 100 ?- EEG/LTM per Neuro. Jodi Marble was increased ?- Keppra BID and PRN ativan for seizure precautions ?- Hold sedating meds including outpt neurontin, robaxin, tramadol ?-  Thiamine, folic acid, MV ? ?Acute respiratory failure secondary to probable aspiration PNA ?- Full vent support ?- Daily WUA/SBT. Not a candidate for extubation due to PRES ?- On minimal setting  ?- VAP bundle ?- Cont Ceftriaxone  (allergic to penicillin) for total 5 days ? ?ESRD on iHD. ?Hyperkalemia - improved ?- S/p lokelma x 2, HD 3/30 ?- Nephrology following. HD today ? ?Hx of HTN, systolic CHF, CAD, HLD. ?- continue  ASA, lipitor, coreg, zetia ?- hold outpt hydralazine, imdur for now ?- Target SBP <140 and DBP < 100 for PRES ? ?DM type 2 poorly controlled with hyperglycemia. ?- SSI ? ?Hx of hypothyroidism. ?TSH WNL ?- continue synthroid ? ?Stage 1 sacral pressure injury and deep tissue pressure injury right Toe, present on admission  ? ?Best Practice (right click and "Reselect all SmartList Selections" daily)  ? ?Diet/type: tubefeeds ?DVT prophylaxis: prophylactic heparin  ?GI prophylaxis: PPI ?Lines: N/A ?Foley:  N/A ?Code Status:  full code ?Last date of multidisciplinary goals of care discussion [3/31] Updated daughter, [name], on patient's critical condition. Remains full code ?Labs   ?CBC: ?Recent Labs  ?Lab 09/20/2021 ?1021 10/05/21 ?4967 10/06/21 ?5916 10/07/21 ?0440  ?WBC 11.3* 7.7 9.7 9.2  ?NEUTROABS 9.4*  --   --   --   ?HGB 11.2* 10.2* 9.3* 9.3*  ?HCT 36.1 30.7* 28.8* 28.0*  ?MCV 110.4* 105.1* 104.7* 105.7*  ?PLT 229 199 207 184  ? ? ?Basic Metabolic Panel: ?Recent Labs  ?Lab 09/20/2021 ?1021 09/16/2021 ?1236 09/29/2021 ?1838 10/05/21 ?3846 10/05/21 ?6599 10/05/21 ?1544 10/06/21 ?0154 10/06/21 ?3570 10/06/21 ?1439 10/06/21 ?1909 10/07/21 ?0440  ?NA 136  --  139 136  --   --  135  --   --   --  136  ?K 6.3*   < > 5.7* 5.7*   < > 5.2* 3.7 4.1 4.3 4.3 4.2  ?CL 102  --  104 100  --   --  95*  --   --   --  98  ?CO2 24  --  22 22  --   --  26  --   --   --  23  ?GLUCOSE 137*  --  61* 180*  --   --  185*  --   --   --  239*  ?BUN 40*  --  39* 41*  --   --  22*  --   --   --  47*  ?CREATININE 5.29*  --  5.44* 5.87*  --   --  4.00*  --   --   --  5.47*  ?CALCIUM 8.3*  --  8.1* 8.2*  --   --  7.8*  --   --   --  7.4*  ?MG  --   --   --   --   --  1.7 1.7  --   --  1.6* 1.8  ?PHOS  --   --   --   --   --  6.7* 4.7*  --   --  5.7* 5.9*  ? < > = values in this interval not displayed.  ? ?GFR: ?Estimated Creatinine Clearance: 12.3 mL/min (A) (by C-G formula based on SCr of 5.47 mg/dL (H)). ?Recent Labs  ?Lab 09/16/2021 ?1021 09/19/2021 ?1235  10/05/21 ?1779 10/06/21 ?3903 10/07/21 ?0440  ?WBC 11.3*  --  7.7 9.7 9.2  ?LATICACIDVEN 1.5 1.2  --   --   --   ? ? ?Liver Function Tests: ?Recent Labs  ?  Lab 09/06/2021 ?1021  ?AST 19  ?ALT 12  ?ALKPHOS 144*  ?BILITOT 0.9  ?PROT 7.6  ?ALBUMIN 3.6  ? ?No results for input(s): LIPASE, AMYLASE in the last 168 hours. ?Recent Labs  ?Lab 10/03/2021 ?2132  ?AMMONIA 42*  ? ? ?ABG ?   ?Component Value Date/Time  ? PHART 7.42 09/24/2021 1128  ? PCO2ART 36 09/08/2021 1128  ? PO2ART 465 (H) 09/23/2021 1128  ? HCO3 23.2 09/22/2021 1128  ? TCO2 28 02/28/2021 0928  ? ACIDBASEDEF 0.8 09/19/2021 1128  ? O2SAT 99.4 09/10/2021 1128  ?  ? ?Coagulation Profile: ?Recent Labs  ?Lab 09/13/2021 ?1021  ?INR 1.1  ? ? ?Cardiac Enzymes: ?No results for input(s): CKTOTAL, CKMB, CKMBINDEX, TROPONINI in the last 168 hours. ? ?HbA1C: ?Hgb A1c MFr Bld  ?Date/Time Value Ref Range Status  ?09/20/2021 09:18 AM 10.5 (H) 4.8 - 5.6 % Final  ?  Comment:  ?           Prediabetes: 5.7 - 6.4 ?         Diabetes: >6.4 ?         Glycemic control for adults with diabetes: <7.0 ?  ?02/14/2021 08:58 AM 8.7 (H) 4.8 - 5.6 % Final  ?  Comment:  ?           Prediabetes: 5.7 - 6.4 ?         Diabetes: >6.4 ?         Glycemic control for adults with diabetes: <7.0 ?  ? ? ?CBG: ?Recent Labs  ?Lab 10/06/21 ?1511 10/06/21 ?1919 10/06/21 ?2348 10/07/21 ?0308 10/07/21 ?6294  ?GLUCAP 160* 248* 260* 230* 264*  ? ? ?Review of Systems:   ?Unable to obtain. ? ?Past Medical History:  ?She,  has a past medical history of Anemia, Arthritis, CAD (coronary artery disease), Cataracts, bilateral, CATARACTS, BILATERAL (07/02/2007), Closed fracture of left femur ([place]) (10/28/2018), Closed fracture of right ankle (11/06/2017), Diabetes mellitus, Emphysematous cystitis (08/26/2018), ESRD on hemodialysis ([place]), GERD (gastroesophageal reflux disease), Hyperlipidemia, Hypertension, IRREGULAR MENSES (09/14/2009), Ischemic cardiomyopathy, PARONYCHIA, RIGHT GREAT TOE (07/30/2008), Pressure ulcer  (09/10/2019), Right arm weakness (08/08/2019), STEMI involving right coronary artery ([place]) (10/18/2019), Stroke ([place]) (10/2019), SVD (spontaneous vaginal delivery), Vaginosis (08/26/2018), and Weakness of bot

## 2021-10-07 NOTE — Progress Notes (Signed)
Maint. Complete. Skin check done.  ?

## 2021-10-07 NOTE — Progress Notes (Signed)
eLink Physician-Brief Progress Note ?Patient Name: Melanie Hall ?DOB: Apr 29, 1971 ?MRN: [id number] ? ? ?Date of Service ? 10/07/2021  ?HPI/Events of Note ? Notified of frequent loose stools.   ?eICU Interventions ? OK to insert flexiseal.   ? ? ? ?Intervention Category ?Minor Interventions: Other: ? ?Elsie Lincoln ?10/07/2021, 2:49 AM ?

## 2021-10-07 NOTE — Progress Notes (Signed)
Admit: 09/16/2021 ?LOS: 3 ? ?29F ESRD found unresponsive at home, mild hyperkalemia, has PRES ? ?Subjective:  ?No sig improvements or changes ? ?03/31 0701 - 04/01 0700 ?In: 1749 [NG/GT:1280; IV Piggyback:469] ?Out: 25 [Urine:25] ? ?Filed Weights  ? 10/05/21 1855 10/06/21 0500 10/07/21 0500  ?Weight: 82 kg 81.1 kg 81.8 kg  ? ? ?Scheduled Meds: ? aspirin  81 mg Per Tube Daily  ? atorvastatin  80 mg Per Tube q1800  ? carvedilol  12.5 mg Per Tube BID WC  ? chlorhexidine gluconate (MEDLINE KIT)  15 mL Mouth Rinse BID  ? Chlorhexidine Gluconate Cloth  6 each Topical Q0600  ? docusate  100 mg Per Tube BID  ? ezetimibe  10 mg Per Tube Daily  ? feeding supplement (PROSource TF)  45 mL Per Tube QID  ? folic acid  1 mg Per Tube Daily  ? heparin injection (subcutaneous)  5,000 Units Subcutaneous Q8H  ? insulin aspart  0-6 Units Subcutaneous Q4H  ? leptospermum manuka honey  1 application. Topical UD  ? levothyroxine  50 mcg Per Tube QAC breakfast  ? mouth rinse  15 mL Mouth Rinse 10 times per day  ? multivitamin  1 tablet Per Tube QHS  ? pantoprazole sodium  40 mg Per Tube Daily  ? polyethylene glycol  17 g Per Tube Daily  ? [START ON 10/13/2021] thiamine injection  100 mg Intravenous Daily  ? ?Continuous Infusions: ? sodium chloride 10 mL/hr at 09/12/2021 1715  ? sodium chloride    ? sodium chloride    ? sodium chloride    ? cefTRIAXone (ROCEPHIN)  IV Stopped (10/06/21 0850)  ? feeding supplement (VITAL 1.5 CAL) 1,000 mL (10/06/21 1200)  ? levETIRAcetam Stopped (10/06/21 2124)  ? thiamine injection    ? ?PRN Meds:.sodium chloride, sodium chloride, acetaminophen, fentaNYL (SUBLIMAZE) injection, heparin, hydrALAZINE, LORazepam, midazolam ? ?Current Labs: reviewed  ? ? ?Physical Exam:  Blood pressure 140/71, pulse 93, temperature 98.5 ?F (36.9 ?C), temperature source Axillary, resp. rate 14, height $RemoveBe'5\' 3"'EIusRaySk$  (1.6 m), weight 81.8 kg, last menstrual period 12/17/2016, SpO2 100 %. ?GEN: Intubated, comatose ?ENT: ET tube in oropharynx ?EYES:  Eyes closed ?CV: Regular, normal S1 and S2 ?PULM: Coarse breath sounds bilaterally ?ABD: Soft ?SKIN: No rashes or lesions ?EXT: 2+ edema ?VASCULAR: Left upper arm AV fistula with bruit and thrill ? ?A ?ESRD TTS [facility] LUE AVF on schedule ?Mild hyperkalemia, resolved ?PRES: Found unresponsive at home / AMS, neuro following, not improving ?VDRF, airway protection ?DM2 ?? Sepsis started on Ceftriaxone ?Anemia, Hb 9s stable CTM ?CKD-BMM: Ca stable, trend P ? ?P ?HD today on [facility] schedule: 2K, AVF, no heparin, 400/600, 3.5h, 3L UF  SBP > 105 ?Medication Issues; ?Preferred narcotic agents for pain control are hydromorphone, fentanyl, and methadone. Morphine should not be used.  ?Baclofen should be avoided ?Avoid oral sodium phosphate and magnesium citrate based laxatives / bowel preps  ? ? ?Pearson Grippe MD ?10/07/2021, 6:29 AM ? ?Recent Labs  ?Lab 10/05/21 ?0156 10/05/21 ?1537 10/06/21 ?0154 10/06/21 ?9432 10/06/21 ?1439 10/06/21 ?1909 10/07/21 ?0440  ?NA 136  --  135  --   --   --  136  ?K 5.7*   < > 3.7   < > 4.3 4.3 4.2  ?CL 100  --  95*  --   --   --  98  ?CO2 22  --  26  --   --   --  23  ?GLUCOSE 180*  --  185*  --   --   --  239*  ?BUN 41*  --  22*  --   --   --  47*  ?CREATININE 5.87*  --  4.00*  --   --   --  5.47*  ?CALCIUM 8.2*  --  7.8*  --   --   --  7.4*  ?PHOS  --    < > 4.7*  --   --  5.7* 5.9*  ? < > = values in this interval not displayed.  ? ? ?Recent Labs  ?Lab 09/13/2021 ?1021 10/05/21 ?3903 10/06/21 ?0092 10/07/21 ?0440  ?WBC 11.3* 7.7 9.7 9.2  ?NEUTROABS 9.4*  --   --   --   ?HGB 11.2* 10.2* 9.3* 9.3*  ?HCT 36.1 30.7* 28.8* 28.0*  ?MCV 110.4* 105.1* 104.7* 105.7*  ?PLT 229 199 207 184  ? ? ? ? ? ? ? ? ? ? ?  ?

## 2021-10-07 DEATH — deceased

## 2021-10-08 DIAGNOSIS — R4182 Altered mental status, unspecified: Secondary | ICD-10-CM | POA: Diagnosis not present

## 2021-10-08 DIAGNOSIS — J9601 Acute respiratory failure with hypoxia: Secondary | ICD-10-CM | POA: Diagnosis not present

## 2021-10-08 DIAGNOSIS — R569 Unspecified convulsions: Secondary | ICD-10-CM | POA: Diagnosis not present

## 2021-10-08 DIAGNOSIS — G40901 Epilepsy, unspecified, not intractable, with status epilepticus: Secondary | ICD-10-CM

## 2021-10-08 DIAGNOSIS — R4020 Unspecified coma: Secondary | ICD-10-CM | POA: Diagnosis not present

## 2021-10-08 LAB — BASIC METABOLIC PANEL
Anion gap: 16 — ABNORMAL HIGH (ref 5–15)
BUN: 30 mg/dL — ABNORMAL HIGH (ref 6–20)
CO2: 26 mmol/L (ref 22–32)
Calcium: 8.1 mg/dL — ABNORMAL LOW (ref 8.9–10.3)
Chloride: 97 mmol/L — ABNORMAL LOW (ref 98–111)
Creatinine, Ser: 3.74 mg/dL — ABNORMAL HIGH (ref 0.44–1.00)
GFR, Estimated: 14 mL/min — ABNORMAL LOW (ref >60)
Glucose, Bld: 325 mg/dL — ABNORMAL HIGH (ref 70–99)
Potassium: 3.7 mmol/L (ref 3.5–5.1)
Sodium: 139 mmol/L (ref 135–145)

## 2021-10-08 LAB — METHYLMALONIC ACID, SERUM: Methylmalonic Acid, Quantitative: 963 nmol/L — ABNORMAL HIGH (ref 0–378)

## 2021-10-08 LAB — GLUCOSE, CAPILLARY
Glucose-Capillary: 327 mg/dL — ABNORMAL HIGH (ref 70–99)
Glucose-Capillary: 385 mg/dL — ABNORMAL HIGH (ref 70–99)
Glucose-Capillary: 349 mg/dL — ABNORMAL HIGH (ref 70–99)
Glucose-Capillary: 347 mg/dL — ABNORMAL HIGH (ref 70–99)
Glucose-Capillary: 320 mg/dL — ABNORMAL HIGH (ref 70–99)
Glucose-Capillary: 356 mg/dL — ABNORMAL HIGH (ref 70–99)

## 2021-10-08 LAB — CBC
HCT: 27.5 % — ABNORMAL LOW (ref 36.0–46.0)
Hemoglobin: 9 g/dL — ABNORMAL LOW (ref 12.0–15.0)
MCH: 34.5 pg — ABNORMAL HIGH (ref 26.0–34.0)
MCHC: 32.7 g/dL (ref 30.0–36.0)
MCV: 105.4 fL — ABNORMAL HIGH (ref 80.0–100.0)
Platelets: 187 10*3/uL (ref 150–400)
RBC: 2.61 MIL/uL — ABNORMAL LOW (ref 3.87–5.11)
RDW: 16.6 % — ABNORMAL HIGH (ref 11.5–15.5)
WBC: 8.4 10*3/uL (ref 4.0–10.5)
nRBC: 0 % (ref 0.0–0.2)

## 2021-10-08 MED ORDER — CLEVIDIPINE BUTYRATE 0.5 MG/ML IV EMUL
0.0000 mg/h | INTRAVENOUS | Status: DC
  Filled 2021-10-08: qty 50

## 2021-10-08 MED ORDER — SODIUM CHLORIDE 0.9 % IV SOLN
200.0000 mg | Freq: Once | INTRAVENOUS | Status: AC
  Administered 2021-10-08: 200 mg via INTRAVENOUS
  Filled 2021-10-08: qty 20

## 2021-10-08 MED ORDER — SODIUM CHLORIDE 0.9 % IV SOLN
75.0000 mg | Freq: Two times a day (BID) | INTRAVENOUS | Status: DC
  Administered 2021-10-08 – 2021-10-11 (×6): 75 mg via INTRAVENOUS
  Filled 2021-10-08 (×7): qty 7.5

## 2021-10-08 NOTE — Procedures (Addendum)
Patient Name: Melanie Hall  ?MRN: [id number]  ?Epilepsy Attending: Lora Havens  ?Referring Physician/Provider: Derek Jack, MD ?Duration:  10/07/2021 1722 to 10/08/2021 1722 ?  ?Patient history: 51yo F with ams. EEG to evaluate for seizure ?  ?Level of alertness:  comatose ?  ?AEDs during EEG study: LEV ?  ?Technical aspects: This EEG study was done with scalp electrodes positioned according to the 10-20 International system of electrode placement. Electrical activity was acquired at a sampling rate of '500Hz'$  and reviewed with a high frequency filter of '70Hz'$  and a low frequency filter of '1Hz'$ . EEG data were recorded continuously and digitally stored.  ?  ?Description:  EEG showed continuous generalized 3-7 hz theta-delta slowing.  Generalized periodic discharges with triphasic morphology were also noted at 2.5 Hz, more prominent when awake/stimulated.  Hyperventilation and photic stimulation were not performed.    ?  ?ABNORMALITY ?- Periodic discharges with triphasic morphology, generalized ?- Continuous slow, generalized ?  ?IMPRESSION: ?This study showed generalized periodic discharges with triphasic morphology which is on the ictal-interictal continuum with high potential for seizures. Additionally there is severe diffuse encephalopathy, nonspecific etiology.  No definite seizures were seen during the study. ? ?EEG appears to be worsening compared to previous day  ?  ?[name]  ?

## 2021-10-08 NOTE — Progress Notes (Signed)
Admit: 09/11/2021 ?LOS: 4 ? ?89F ESRD found unresponsive at home, mild hyperkalemia, has PRES ? ?Subjective:  ?No sig improvements or changes neurologically ?HD yesterday 3L UF ? ?04/01 0701 - 04/02 0700 ?In: 1208.1 [NG/GT:950; IV Piggyback:258.1] ?Out: 3000  ? ?Filed Weights  ? 10/06/21 0500 10/07/21 0500 10/08/21 0406  ?Weight: 81.1 kg 81.8 kg 80.2 kg  ? ? ?Scheduled Meds: ? aspirin  81 mg Per Tube Daily  ? atorvastatin  80 mg Per Tube q1800  ? carvedilol  12.5 mg Per Tube BID WC  ? chlorhexidine gluconate (MEDLINE KIT)  15 mL Mouth Rinse BID  ? Chlorhexidine Gluconate Cloth  6 each Topical Q0600  ? docusate  100 mg Per Tube BID  ? ezetimibe  10 mg Per Tube Daily  ? feeding supplement (PROSource TF)  45 mL Per Tube QID  ? folic acid  1 mg Per Tube Daily  ? heparin injection (subcutaneous)  5,000 Units Subcutaneous Q8H  ? insulin aspart  0-6 Units Subcutaneous Q4H  ? insulin glargine-yfgn  5 Units Subcutaneous QHS  ? leptospermum manuka honey  1 application. Topical UD  ? levothyroxine  50 mcg Per Tube QAC breakfast  ? mouth rinse  15 mL Mouth Rinse 10 times per day  ? multivitamin  1 tablet Per Tube QHS  ? pantoprazole sodium  40 mg Per Tube Daily  ? polyethylene glycol  17 g Per Tube Daily  ? [START ON 10/12/2021] thiamine injection  100 mg Intravenous Daily  ? ?Continuous Infusions: ? sodium chloride 10 mL/hr at 09/16/2021 1715  ? sodium chloride    ? sodium chloride    ? sodium chloride    ? cefTRIAXone (ROCEPHIN)  IV Stopped (10/07/21 1424)  ? feeding supplement (VITAL 1.5 CAL) 1,000 mL (10/07/21 1338)  ? lacosamide (VIMPAT) IV    ? levETIRAcetam 750 mg (10/08/21 0006)  ? thiamine injection Stopped (10/07/21 1523)  ? ?PRN Meds:.sodium chloride, sodium chloride, acetaminophen, fentaNYL (SUBLIMAZE) injection, heparin, hydrALAZINE, LORazepam, midazolam ? ?Current Labs: reviewed  ? ? ?Physical Exam:  Blood pressure (!) 134/53, pulse 87, temperature 98.5 ?F (36.9 ?C), temperature source Oral, resp. rate 18, height '5\' 3"'   (1.6 m), weight 80.2 kg, last menstrual period 12/17/2016, SpO2 100 %. ?GEN: Intubated, comatose ?ENT: ET tube in oropharynx ?EYES: Eyes closed ?CV: Regular, normal S1 and S2 ?PULM: Coarse breath sounds bilaterally ?ABD: Soft ?SKIN: No rashes or lesions ?EXT: 2+ edema ?VASCULAR: Left upper arm AV fistula with bruit and thrill ? ?A ?ESRD TTS [facility] LUE AVF on schedule ?Mild hyperkalemia, resolved ?PRES: Found unresponsive at home / AMS, neuro following, not improving; will be long process ?VDRF, airway protection ?DM2 ?? Sepsis started on Ceftriaxone; BCx x2 NGTD ?Anemia, Hb 9s stable CTM ?CKD-BMM: Ca stable, trend P ? ?P ?Next HD on 4/4 ?Medication Issues; ?Preferred narcotic agents for pain control are hydromorphone, fentanyl, and methadone. Morphine should not be used.  ?Baclofen should be avoided ?Avoid oral sodium phosphate and magnesium citrate based laxatives / bowel preps  ? ? ?Pearson Grippe MD ?10/08/2021, 8:20 AM ? ?Recent Labs  ?Lab 10/06/21 ?0154 10/06/21 ?6834 10/06/21 ?1909 10/07/21 ?1962 10/08/21 ?0225  ?NA 135  --   --  136 139  ?K 3.7   < > 4.3 4.2 3.7  ?CL 95*  --   --  98 97*  ?CO2 26  --   --  23 26  ?GLUCOSE 185*  --   --  239* 325*  ?BUN 22*  --   --  47* 30*  ?CREATININE 4.00*  --   --  5.47* 3.74*  ?CALCIUM 7.8*  --   --  7.4* 8.1*  ?PHOS 4.7*  --  5.7* 5.9*  --   ? < > = values in this interval not displayed.  ? ? ?Recent Labs  ?Lab 09/10/2021 ?1021 10/05/21 ?5009 10/06/21 ?3818 10/07/21 ?2993 10/08/21 ?0225  ?WBC 11.3*   < > 9.7 9.2 8.4  ?NEUTROABS 9.4*  --   --   --   --   ?HGB 11.2*   < > 9.3* 9.3* 9.0*  ?HCT 36.1   < > 28.8* 28.0* 27.5*  ?MCV 110.4*   < > 104.7* 105.7* 105.4*  ?PLT 229   < > 207 184 187  ? < > = values in this interval not displayed.  ? ? ? ? ? ? ? ? ? ? ?  ?

## 2021-10-08 NOTE — Progress Notes (Signed)
? ?NAME:  Melanie Hall, MRN:  [id number], DOB:  September 22, 1970, LOS: 4 ?ADMISSION DATE:  09/14/2021, CONSULTATION DATE:  09/21/2021 ?REFERRING MD:  Dr. Roderic Palau, ER, CHIEF COMPLAINT:  AMS  ? ?History of Present Illness:  ?51 yo female former smoker with hx of ESRD on HD was found at her home by patient transport driver unresponsive.  Pt's neighbor noted that she was tapping her hands for about a minute, and then stopped.  She has snoring and drooling.  No improvement after narcan.  CBG normal.  Noted to have BP 215/96.  Intubated in [facility] for airway protection, given keppra load, started on diprivan, and started on antibiotics.  CT head negative for acute findings.  Treated for hyperkalemia.  Pt's daughter report she had similar episode on 09/24/21 and EMS was called to the house, but episode resolve and patient declined hospital evaluation.  No prior history of seizures or known head injury.  ER team d/w case with neurology and advised to transfer to [facility] for EEG monitoring and further neurology assessment. ? ?Hx from medical team, patients daughter at bedside, and medical records. ? ?Pertinent  Medical History  ?CHF, Anemia, OA, CAD, DM type 2, ESRD on HD, HLD, HTN, CVA ? ?Significant Hospital Events: ?Including procedures, antibiotic start and stop dates in addition to other pertinent events   ?3/29 present to [facility], intubated, transfer to [facility] ?3/30 MRI Brain consistent with PRES ? ?Interim History / Subjective:  ? ?Opens eyes with noxious stimuli. Decerebrate posturing with right hand ? ?Boyfriend previously reported patient was in her normal health on Tuesday ? ?Objective   ?Blood pressure (!) 134/53, pulse 87, temperature (!) 100.5 ?F (38.1 ?C), temperature source Axillary, resp. rate 18, height '5\' 3"'$  (1.6 m), weight 80.2 kg, last menstrual period 12/17/2016, SpO2 100 %. ?   ?Vent Mode: PRVC ?FiO2 (%):  [40 %] 40 % ?Set Rate:  [18 bmp] 18 bmp ?Vt Set:  [420 mL] 420 mL ?PEEP:  [5 cmH20] 5 cmH20 ?Pressure Support:  [8 cmH20]  8 cmH20 ?Plateau Pressure:  [15 cmH20] 15 cmH20  ? ?Intake/Output Summary (Last 24 hours) at 10/08/2021 0741 ?Last data filed at 10/08/2021 0500 ?Gross per 24 hour  ?Intake 1208.09 ml  ?Output 3000 ml  ?Net -1791.91 ml  ? ?Filed Weights  ? 10/06/21 0500 10/07/21 0500 10/08/21 0406  ?Weight: 81.1 kg 81.8 kg 80.2 kg  ? ?Physical Exam: ?General: Chronically ill-appearing, no acute distress ?HENT: [place], AT, ETT in place ?Eyes: EOMI, no scleral icterus ?Respiratory: Clear to auscultation bilaterally.  No crackles, wheezing or rales ?Cardiovascular: RRR, -M/R/G, no JVD ?GI: BS+, soft, nontender ?Extremities:-Edema,-tenderness ?Neuro: Left ovid pupil, nonreactive. Right pupil reactive. Does not follow commands Opens eyes with noxious stimuli. Decerebrate posturing  ?Skin: Intact, no rashes or bruising ?Psych: Normal mood, normal affect ?GU: [place] in place ? ? ?Resolved Hospital Problem list   ? ? ?Assessment & Plan:  ?Posteriror reversible encephalopathy syndrome ?Acute metabolic encephalopathy 2/2 PRES ?Seizure associated with PRES ?MRI brain consistent with PRES. No seizures seen on spot EEG but showed cortical dysfunction from the left hemisphere.  CT head did not show any acute intracranial hemorrhage/mass/lesion.  No dural sinus thrombus. There are mild stenosis of the left ICA but should not cause her issue. CVA should not cause this extent of encephalopathy and a large stroke should appear on CT. ?- Off sedation. If needed for vent dyssynchrony, add Precedex ?- EEG/LTM per Neuro. Appreciate input ?- Keppra BID and PRN ativan  for seizure precautions ?- Hold sedating meds including outpt neurontin, robaxin, tramadol ?- Thiamine, folic acid, MV ?- Discussed with daughter patient's condition. Neuro demonstrates some responsiveness today however unclear if this is meaningful. Expect slow recovery and unlikelihood of returning to being independent status given her co-morbidities. We briefly discussed potentially need for  tracheostomy if we wish to be aggressive with her care and what barriers this can pose especially with her known dialysis status. Daughter expressed understanding and would like to continue current care to see if patient continues to make progress. ? ?Acute respiratory failure secondary to probable aspiration PNA ?- Full vent support ?- Daily WUA/SBT. Not a candidate for extubation due to PRES ?- On minimal setting  ?- VAP bundle ?- Cont Ceftriaxone  (allergic to penicillin) for total 5 days. End 4/2 ? ?ESRD on iHD. ?Hyperkalemia - improved ?- S/p lokelma x 2, HD 3/30, 4/1 ?- Nephrology following.  ? ?Hx of HTN, systolic CHF, CAD, HLD. ?- continue ASA, lipitor, coreg, zetia ?- hold outpt hydralazine, imdur for now ?- Target SBP <140 and DBP < 100 for PRES ? ?DM type 2 poorly controlled with hyperglycemia. ?- SSI ? ?Hx of hypothyroidism. ?TSH WNL ?- continue synthroid ? ?Stage 1 sacral pressure injury and deep tissue pressure injury right Toe, present on admission  ? ?Best Practice (right click and "Reselect all SmartList Selections" daily)  ? ?Diet/type: tubefeeds ?DVT prophylaxis: prophylactic heparin  ?GI prophylaxis: PPI ?Lines: N/A ?Foley:  N/A ?Code Status:  full code ?Last date of multidisciplinary goals of care discussion [3/31] Updated daughter, [name], on patient's critical condition. Remains full code ?Labs   ?CBC: ?Recent Labs  ?Lab 09/07/2021 ?1021 10/05/21 ?7209 10/06/21 ?4709 10/07/21 ?6283 10/08/21 ?0225  ?WBC 11.3* 7.7 9.7 9.2 8.4  ?NEUTROABS 9.4*  --   --   --   --   ?HGB 11.2* 10.2* 9.3* 9.3* 9.0*  ?HCT 36.1 30.7* 28.8* 28.0* 27.5*  ?MCV 110.4* 105.1* 104.7* 105.7* 105.4*  ?PLT 229 199 207 184 187  ? ? ?Basic Metabolic Panel: ?Recent Labs  ?Lab 10/01/2021 ?1838 10/05/21 ?6629 10/05/21 ?4765 10/05/21 ?1544 10/06/21 ?0154 10/06/21 ?4650 10/06/21 ?1439 10/06/21 ?1909 10/07/21 ?3546 10/08/21 ?0225  ?NA 139 136  --   --  135  --   --   --  136 139  ?K 5.7* 5.7*   < > 5.2* 3.7 4.1 4.3 4.3 4.2 3.7  ?CL 104  100  --   --  95*  --   --   --  98 97*  ?CO2 22 22  --   --  26  --   --   --  23 26  ?GLUCOSE 61* 180*  --   --  185*  --   --   --  239* 325*  ?BUN 39* 41*  --   --  22*  --   --   --  47* 30*  ?CREATININE 5.44* 5.87*  --   --  4.00*  --   --   --  5.47* 3.74*  ?CALCIUM 8.1* 8.2*  --   --  7.8*  --   --   --  7.4* 8.1*  ?MG  --   --   --  1.7 1.7  --   --  1.6* 1.8  --   ?PHOS  --   --   --  6.7* 4.7*  --   --  5.7* 5.9*  --   ? < > = values  in this interval not displayed.  ? ?GFR: ?Estimated Creatinine Clearance: 17.8 mL/min (A) (by C-G formula based on SCr of 3.74 mg/dL (H)). ?Recent Labs  ?Lab 09/30/2021 ?1021 09/25/2021 ?1235 10/05/21 ?2025 10/06/21 ?4270 10/07/21 ?6237 10/08/21 ?0225  ?WBC 11.3*  --  7.7 9.7 9.2 8.4  ?LATICACIDVEN 1.5 1.2  --   --   --   --   ? ? ?Liver Function Tests: ?Recent Labs  ?Lab 09/20/2021 ?1021  ?AST 19  ?ALT 12  ?ALKPHOS 144*  ?BILITOT 0.9  ?PROT 7.6  ?ALBUMIN 3.6  ? ?No results for input(s): LIPASE, AMYLASE in the last 168 hours. ?Recent Labs  ?Lab 09/08/2021 ?2132  ?AMMONIA 42*  ? ? ?ABG ?   ?Component Value Date/Time  ? PHART 7.42 09/17/2021 1128  ? PCO2ART 36 09/21/2021 1128  ? PO2ART 465 (H) 09/30/2021 1128  ? HCO3 23.2 09/20/2021 1128  ? TCO2 28 02/28/2021 0928  ? ACIDBASEDEF 0.8 10/06/2021 1128  ? O2SAT 99.4 10/03/2021 1128  ?  ? ?Coagulation Profile: ?Recent Labs  ?Lab 09/23/2021 ?1021  ?INR 1.1  ? ? ?Cardiac Enzymes: ?No results for input(s): CKTOTAL, CKMB, CKMBINDEX, TROPONINI in the last 168 hours. ? ?HbA1C: ?Hgb A1c MFr Bld  ?Date/Time Value Ref Range Status  ?09/20/2021 09:18 AM 10.5 (H) 4.8 - 5.6 % Final  ?  Comment:  ?           Prediabetes: 5.7 - 6.4 ?         Diabetes: >6.4 ?         Glycemic control for adults with diabetes: <7.0 ?  ?02/14/2021 08:58 AM 8.7 (H) 4.8 - 5.6 % Final  ?  Comment:  ?           Prediabetes: 5.7 - 6.4 ?         Diabetes: >6.4 ?         Glycemic control for adults with diabetes: <7.0 ?  ? ? ?CBG: ?Recent Labs  ?Lab 10/07/21 ?1527 10/07/21 ?1922  10/07/21 ?2355 10/08/21 ?6283 10/08/21 ?1517  ?GLUCAP 188* 306* 293* 327* 385*  ? ? ?Review of Systems:   ?Unable to obtain. ? ?Past Medical History:  ?She,  has a past medical history of Anemia, Arthritis, C

## 2021-10-08 NOTE — Progress Notes (Signed)
Maint. Complete. Skin check complete. No break down. ?

## 2021-10-09 DIAGNOSIS — R569 Unspecified convulsions: Secondary | ICD-10-CM | POA: Diagnosis not present

## 2021-10-09 DIAGNOSIS — Z9911 Dependence on respirator [ventilator] status: Secondary | ICD-10-CM | POA: Diagnosis not present

## 2021-10-09 DIAGNOSIS — G9340 Encephalopathy, unspecified: Secondary | ICD-10-CM

## 2021-10-09 DIAGNOSIS — R4182 Altered mental status, unspecified: Secondary | ICD-10-CM | POA: Diagnosis not present

## 2021-10-09 DIAGNOSIS — J9601 Acute respiratory failure with hypoxia: Secondary | ICD-10-CM | POA: Diagnosis not present

## 2021-10-09 LAB — GLUCOSE, CAPILLARY
Glucose-Capillary: 134 mg/dL — ABNORMAL HIGH (ref 70–99)
Glucose-Capillary: 153 mg/dL — ABNORMAL HIGH (ref 70–99)
Glucose-Capillary: 160 mg/dL — ABNORMAL HIGH (ref 70–99)
Glucose-Capillary: 160 mg/dL — ABNORMAL HIGH (ref 70–99)
Glucose-Capillary: 161 mg/dL — ABNORMAL HIGH (ref 70–99)
Glucose-Capillary: 163 mg/dL — ABNORMAL HIGH (ref 70–99)
Glucose-Capillary: 163 mg/dL — ABNORMAL HIGH (ref 70–99)
Glucose-Capillary: 166 mg/dL — ABNORMAL HIGH (ref 70–99)
Glucose-Capillary: 166 mg/dL — ABNORMAL HIGH (ref 70–99)
Glucose-Capillary: 167 mg/dL — ABNORMAL HIGH (ref 70–99)
Glucose-Capillary: 183 mg/dL — ABNORMAL HIGH (ref 70–99)
Glucose-Capillary: 237 mg/dL — ABNORMAL HIGH (ref 70–99)
Glucose-Capillary: 283 mg/dL — ABNORMAL HIGH (ref 70–99)
Glucose-Capillary: 336 mg/dL — ABNORMAL HIGH (ref 70–99)
Glucose-Capillary: 382 mg/dL — ABNORMAL HIGH (ref 70–99)
Glucose-Capillary: 384 mg/dL — ABNORMAL HIGH (ref 70–99)
Glucose-Capillary: 389 mg/dL — ABNORMAL HIGH (ref 70–99)
Glucose-Capillary: 401 mg/dL — ABNORMAL HIGH (ref 70–99)

## 2021-10-09 LAB — TRIGLYCERIDES: Triglycerides: 141 mg/dL (ref <150)

## 2021-10-09 MED ORDER — INSULIN REGULAR(HUMAN) IN NACL 100-0.9 UT/100ML-% IV SOLN
INTRAVENOUS | Status: DC
  Administered 2021-10-09: 5.5 [IU]/h via INTRAVENOUS
  Administered 2021-10-09: 10 [IU]/h via INTRAVENOUS
  Filled 2021-10-09 (×2): qty 100

## 2021-10-09 MED ORDER — POLYETHYLENE GLYCOL 3350 17 G PO PACK: 17.0000 g | PACK | Freq: Every day | ORAL | Status: DC | PRN

## 2021-10-09 MED ORDER — DOCUSATE SODIUM 50 MG/5ML PO LIQD: 100.0000 mg | Freq: Two times a day (BID) | ORAL | Status: DC | PRN

## 2021-10-09 MED ORDER — DEXTROSE 50 % IV SOLN: 0.0000 mL | INTRAVENOUS | Status: DC | PRN

## 2021-10-09 MED ORDER — NUTRISOURCE FIBER PO PACK
1.0000 | PACK | Freq: Three times a day (TID) | ORAL | Status: DC
  Administered 2021-10-09 – 2021-11-01 (×70): 1
  Filled 2021-10-09 (×75): qty 1

## 2021-10-09 MED ORDER — HYDRALAZINE HCL 10 MG PO TABS
10.0000 mg | ORAL_TABLET | Freq: Two times a day (BID) | ORAL | Status: DC
  Administered 2021-10-09 – 2021-10-18 (×18): 10 mg
  Filled 2021-10-09 (×18): qty 1

## 2021-10-09 NOTE — Progress Notes (Signed)
? ?NAME:  Melanie Hall, MRN:  [id number], DOB:  Jul 30, 1970, LOS: 5 ?ADMISSION DATE:  09/17/2021, CONSULTATION DATE:  10/02/2021 ?REFERRING MD:  Dr. Roderic Palau, ER, CHIEF COMPLAINT:  AMS  ? ?History of Present Illness:  ?51 yo female former smoker with hx of ESRD on HD was found at her home by patient transport driver unresponsive.  Pt's neighbor noted that she was tapping her hands for about a minute, and then stopped.  She has snoring and drooling.  No improvement after narcan.  CBG normal.  Noted to have BP 215/96.  Intubated in [facility] for airway protection, given keppra load, started on diprivan, and started on antibiotics.  CT head negative for acute findings.  Treated for hyperkalemia.  Pt's daughter report she had similar episode on 09/24/21 and EMS was called to the house, but episode resolve and patient declined hospital evaluation.  No prior history of seizures or known head injury.  ER team d/w case with neurology and advised to transfer to [facility] for EEG monitoring and further neurology assessment. ? ?Hx from medical team, patients daughter at bedside, and medical records. ? ?Pertinent  Medical History  ?CHF, Anemia, OA, CAD, DM type 2, ESRD on HD, HLD, HTN, CVA ? ?Significant Hospital Events: ?Including procedures, antibiotic start and stop dates in addition to other pertinent events   ?3/29 present to [facility], intubated, transfer to [facility] ?3/30 MRI Brain consistent with PRES ?4/3 off sedation, not waking up ? ?Interim History / Subjective:  ?Overnight no acute events ? ?Objective   ?Blood pressure 139/68, pulse 96, temperature 99.4 ?F (37.4 ?C), temperature source Axillary, resp. rate 17, height '5\' 3"'$  (1.6 m), weight 80.2 kg, last menstrual period 12/17/2016, SpO2 98 %. ?   ?Vent Mode: PSV;CPAP ?FiO2 (%):  [40 %] 40 % ?Set Rate:  [18 bmp] 18 bmp ?Vt Set:  [420 mL] 420 mL ?PEEP:  [5 cmH20] 5 cmH20 ?Pressure Support:  [8 cmH20] 8 cmH20 ?Plateau Pressure:  [11 cmH20-22 cmH20] 11 cmH20  ? ?Intake/Output Summary (Last 24  hours) at 10/09/2021 0913 ?Last data filed at 10/09/2021 0600 ?Gross per 24 hour  ?Intake 1208 ml  ?Output 550 ml  ?Net 658 ml  ? ? ?Filed Weights  ? 10/07/21 0500 10/08/21 0406 10/09/21 0500  ?Weight: 81.8 kg 80.2 kg 80.2 kg  ? ?Physical Exam: ?General: Critically ill-appearing woman lying in bed no acute distress, not sedated, intubated ?HEENT: [place]/AT, eyes anicteric, tracheal tube in place ?Respiratory: Decreased in the vent, CTA B ?Cardiovascular: 1 S2, regular rate and rhythm ?GI: Soft, nontender ?Extremities: No peripheral edema, no cyanosis ?Neuro: Some eye-opening during exam, but not tracking.  No blinking to threat, no corneal reflexes.  Upper extremities appear to be flexor posturing ?Skin: Warm, dry, hirsutism ?GU: Pure wick draining yellow urine ? ? ?BG 300s ?BUN 30 ?Cr 27.5 ?Platelets 187 ? ? ?Resolved Hospital Problem list   ? ? ?Assessment & Plan:  ?Posterior reversible encephalopathy syndrome with associated seizures ?Acute metabolic encephalopathy 2/2 PRES ?MRI brain consistent with PRES. No seizures seen on spot EEG but showed cortical dysfunction from the left hemisphere.  CT head did not show any acute intracranial hemorrhage/mass/lesion.  No dural sinus thrombus. There are mild stenosis of the left ICA but should not cause her issue. CVA should not cause this extent of encephalopathy and a large stroke should appear on CT. ?-Remains off sedation ?- Appreciate neurology's recommendations.  Continue long-term EEG ?-Continue Keppra twice daily, Vimpat, plus Ativan as needed ?-  Holding outpatient Neurontin, Robaxin, tramadol ?- Supplements, including thiamine ?- Ongoing goals of care discussions with family.  At this point looks like this would be a prolonged recovery with unknown residual deficits.  Anticipate that she would likely not be able to live independently in the community in the future without significant assistance. ? ?Acute respiratory failure secondary to probable aspiration PNA ?-Low  tidal volume ventilation, 4 to 8 cc/kg ideal body weight comatose and 30 driving pressure less than 15 ?- VAP prevention protocol ?- Daily SAT and SBT as appropriate-sedation has been off not waking up.  Mental status precludes extubation safely. ?- Discussions regarding potential need for tracheostomy have been initiated.  Ongoing discussions needed. ?- Completed 5 days ceftriaxone on 4/2 ? ?ESRD on iHD. ?Hyperkalemia -resolved ?-Nephrology following, appreciate their assistance. ?- Renally dose meds ?- Strict I's/O ? ?Hx of HTN, chronic HFrEF, CAD, HLD. ?-Continue aspirin, statin, carvedilol, Zetia ?-Resuming PTA hydralazine 10 mg twice daily ?-Goal systolic blood pressure less than 622, diastolic less than 633 for press ?-Continue holding outpatient Imdur for now ? ?DM type 2 poorly controlled with hyperglycemia. ?-Insulin drip started today ?-Goal BG 140-180 ? ?Hx of hypothyroidism.. TSH WNL ?-Continue Synthroid ? ?Stage 1 sacral pressure injury and deep tissue pressure injury right Toe, present on admission  ?-Wound care ? ?Best Practice (right click and "Reselect all SmartList Selections" daily)  ? ?Diet/type: tubefeeds ?DVT prophylaxis: prophylactic heparin  ?GI prophylaxis: PPI ?Lines: N/A ?Foley:  N/A ?Code Status:  full code ?Last date of multidisciplinary goals of care discussion [3/31] Updated daughter, [name], on patient's critical condition. Remains full code ?Labs   ?CBC: ?Recent Labs  ?Lab 09/28/2021 ?1021 10/05/21 ?3545 10/06/21 ?6256 10/07/21 ?3893 10/08/21 ?0225  ?WBC 11.3* 7.7 9.7 9.2 8.4  ?NEUTROABS 9.4*  --   --   --   --   ?HGB 11.2* 10.2* 9.3* 9.3* 9.0*  ?HCT 36.1 30.7* 28.8* 28.0* 27.5*  ?MCV 110.4* 105.1* 104.7* 105.7* 105.4*  ?PLT 229 199 207 184 187  ? ? ? ?Basic Metabolic Panel: ?Recent Labs  ?Lab 09/25/2021 ?1838 10/05/21 ?7342 10/05/21 ?8768 10/05/21 ?1544 10/06/21 ?0154 10/06/21 ?1157 10/06/21 ?1439 10/06/21 ?1909 10/07/21 ?2620 10/08/21 ?0225  ?NA 139 136  --   --  135  --   --   --  136  139  ?K 5.7* 5.7*   < > 5.2* 3.7 4.1 4.3 4.3 4.2 3.7  ?CL 104 100  --   --  95*  --   --   --  98 97*  ?CO2 22 22  --   --  26  --   --   --  23 26  ?GLUCOSE 61* 180*  --   --  185*  --   --   --  239* 325*  ?BUN 39* 41*  --   --  22*  --   --   --  47* 30*  ?CREATININE 5.44* 5.87*  --   --  4.00*  --   --   --  5.47* 3.74*  ?CALCIUM 8.1* 8.2*  --   --  7.8*  --   --   --  7.4* 8.1*  ?MG  --   --   --  1.7 1.7  --   --  1.6* 1.8  --   ?PHOS  --   --   --  6.7* 4.7*  --   --  5.7* 5.9*  --   ? < > =  values in this interval not displayed.  ? ? ?GFR: ?Estimated Creatinine Clearance: 17.8 mL/min (A) (by C-G formula based on SCr of 3.74 mg/dL (H)). ?Recent Labs  ?Lab 10/05/2021 ?1021 10/05/2021 ?1235 10/05/21 ?3825 10/06/21 ?0539 10/07/21 ?7673 10/08/21 ?0225  ?WBC 11.3*  --  7.7 9.7 9.2 8.4  ?LATICACIDVEN 1.5 1.2  --   --   --   --   ? ? ? ?Liver Function Tests: ?Recent Labs  ?Lab 10/02/2021 ?1021  ?AST 19  ?ALT 12  ?ALKPHOS 144*  ?BILITOT 0.9  ?PROT 7.6  ?ALBUMIN 3.6  ? ? ?No results for input(s): LIPASE, AMYLASE in the last 168 hours. ?Recent Labs  ?Lab 09/18/2021 ?2132  ?AMMONIA 42*  ? ? ?This patient is critically ill with multiple organ system failure which requires frequent high complexity decision making, assessment, support, evaluation, and titration of therapies. This was completed through the application of advanced monitoring technologies and extensive interpretation of multiple databases. During this encounter critical care time was devoted to patient care services described in this note for 36 minutes. ? ?Julian Hy, DO 10/09/21 10:00 AM ? Pulmonary & Critical Care ? ?

## 2021-10-09 NOTE — Progress Notes (Signed)
Admit: 09/20/2021 ?LOS: 5 ? ?23F ESRD found unresponsive at home, mild hyperkalemia, + PRES ? ?Subjective:  ?No sig improvements or changes neurologically ? ?04/02 0701 - 04/03 0700 ?In: 4818 [NG/GT:1300; IV HUDJSHFWY:637] ?Out: 550 [Urine:400; Stool:150] ? ?Filed Weights  ? 10/07/21 0500 10/08/21 0406 10/09/21 0500  ?Weight: 81.8 kg 80.2 kg 80.2 kg  ? ? ?Scheduled Meds: ? aspirin  81 mg Per Tube Daily  ? atorvastatin  80 mg Per Tube q1800  ? carvedilol  12.5 mg Per Tube BID WC  ? chlorhexidine gluconate (MEDLINE KIT)  15 mL Mouth Rinse BID  ? Chlorhexidine Gluconate Cloth  6 each Topical Q0600  ? ezetimibe  10 mg Per Tube Daily  ? feeding supplement (PROSource TF)  45 mL Per Tube QID  ? fiber  1 packet Per Tube TID  ? folic acid  1 mg Per Tube Daily  ? heparin injection (subcutaneous)  5,000 Units Subcutaneous Q8H  ? hydrALAZINE  10 mg Per Tube BID  ? leptospermum manuka honey  1 application. Topical UD  ? levothyroxine  50 mcg Per Tube QAC breakfast  ? mouth rinse  15 mL Mouth Rinse 10 times per day  ? multivitamin  1 tablet Per Tube QHS  ? pantoprazole sodium  40 mg Per Tube Daily  ? [START ON 10/12/2021] thiamine injection  100 mg Intravenous Daily  ? ?Continuous Infusions: ? sodium chloride 10 mL/hr at 09/16/2021 1715  ? sodium chloride    ? sodium chloride    ? sodium chloride    ? clevidipine    ? feeding supplement (VITAL 1.5 CAL) 50 mL/hr at 10/09/21 1000  ? insulin 11 Units/hr (10/09/21 1200)  ? lacosamide (VIMPAT) IV Stopped (10/09/21 1146)  ? levETIRAcetam 750 mg (10/09/21 1324)  ? thiamine injection 250 mg (10/09/21 1413)  ? ?PRN Meds:.sodium chloride, sodium chloride, acetaminophen, dextrose, docusate, fentaNYL (SUBLIMAZE) injection, heparin, hydrALAZINE, LORazepam, midazolam, polyethylene glycol ? ?Current Labs: reviewed  ? ? ?Physical Exam:  Blood pressure (!) 146/61, pulse 91, temperature 99.1 ?F (37.3 ?C), temperature source Axillary, resp. rate 17, height _0  (1.6 m), weight 80.2 kg, last menstrual  period 12/17/2016, SpO2 100 %. ?GEN: Intubated, comatose ?ENT: ET tube in oropharynx ?EYES: Eyes closed ?CV: Regular, normal S1 and S2 ?PULM: Coarse breath sounds bilaterally ?ABD: Soft ?SKIN: No rashes or lesions ?EXT: 2+ edema ?VASCULAR: Left upper arm AV fistula with bruit and thrill ? ? ? OP HD: TTS [facility] ?  LUE AVF ? - 3/30 here > hep B Ag negative and hep B Ab's low/ not protective ? ?CXR 3/30 - no active disease ? ?Assessment/ Plan:  ?PRES: Found unresponsive at home / AMS, neuro following, not improving; will be long process ?VDRF- for airway protection ?ESRD - on TTS [facility]. HD tomorrow.  ?Volume - 3-4 kg under, lower UFG next HD to 1-2L. No vol ^ on exam.  ?DM2 - per pmd ?Possible sepsis - started on Ceftriaxone; BCx x2 NGTD ?Anemia - Hb 9s stable CTM ?CKD-BMM: Ca stable, trend P ? ? ?Kelly Splinter, MD ?10/09/2021, 4:00 PM ? ? ?General ESRD medication Issues: ?Preferred narcotic agents for pain control are hydromorphone, fentanyl, and methadone. Morphine should not be used.  ?Baclofen should be avoided ?Avoid oral sodium phosphate and magnesium citrate based laxatives / bowel preps  ? ?Recent Labs  ?Lab 10/06/21 ?0154 10/06/21 ?8588 10/06/21 ?1909 10/07/21 ?5027 10/08/21 ?0225  ?NA 135  --   --  136 139  ?K  3.7   < > 4.3 4.2 3.7  ?CL 95*  --   --  98 97*  ?CO2 26  --   --  23 26  ?GLUCOSE 185*  --   --  239* 325*  ?BUN 22*  --   --  47* 30*  ?CREATININE 4.00*  --   --  5.47* 3.74*  ?CALCIUM 7.8*  --   --  7.4* 8.1*  ?PHOS 4.7*  --  5.7* 5.9*  --   ? < > = values in this interval not displayed.  ? ? ?Recent Labs  ?Lab 09/28/2021 ?1021 10/05/21 ?6808 10/06/21 ?8110 10/07/21 ?3159 10/08/21 ?0225  ?WBC 11.3*   < > 9.7 9.2 8.4  ?NEUTROABS 9.4*  --   --   --   --   ?HGB 11.2*   < > 9.3* 9.3* 9.0*  ?HCT 36.1   < > 28.8* 28.0* 27.5*  ?MCV 110.4*   < > 104.7* 105.7* 105.4*  ?PLT 229   < > 207 184 187  ? < > = values in this interval not displayed.  ? ? ? ? ? ? ? ? ? ? ?  ?

## 2021-10-09 NOTE — Procedures (Addendum)
Patient Name: VONCILLE SIMM  ?MRN: [id number]  ?Epilepsy Attending: Lora Havens  ?Referring Physician/Provider: Derek Jack, MD ?Duration:  10/08/2021 1722 to 10/09/2021 1722 ?  ?Patient history: 51yo F with ams. EEG to evaluate for seizure ?  ?Level of alertness:  comatose ?  ?AEDs during EEG study: LEV, LCM ?  ?Technical aspects: This EEG study was done with scalp electrodes positioned according to the 10-20 International system of electrode placement. Electrical activity was acquired at a sampling rate of '500Hz'$  and reviewed with a high frequency filter of '70Hz'$  and a low frequency filter of '1Hz'$ . EEG data were recorded continuously and digitally stored.  ?  ?Description:  EEG showed continuous generalized 3-7 hz theta-delta slowing.  Generalized periodic discharges with triphasic morphology were also noted at 2-2.5 Hz, more prominent when awake/stimulated.  Hyperventilation and photic stimulation were not performed.    ?  ?ABNORMALITY ?- Periodic discharges with triphasic morphology, generalized ?- Continuous slow, generalized ?  ?IMPRESSION: ?This study showed generalized periodic discharges with triphasic morphology which is on the ictal-interictal continuum with high potential for seizures. Additionally there is severe diffuse encephalopathy, nonspecific etiology.  No definite seizures were seen during the study. ?  ?EEG appears similar to previous day  ?  ?Shanan Mcmiller Barbra Sarks  ?

## 2021-10-09 NOTE — Progress Notes (Signed)
Maint complete. No skin break down. ?

## 2021-10-09 NOTE — Progress Notes (Signed)
Subjective:  NAEO. Opening eyes now, still not following commands ? ?ROS: Unable to obtain due to poor mental status ? ?Examination ? ?Vital signs in last 24 hours: ?Temp:  [99 ?F (37.2 ?C)-99.4 ?F (37.4 ?C)] 99.1 ?F (37.3 ?C) (04/03 1515) ?Pulse Rate:  [80-103] 84 (04/03 1721) ?Resp:  [15-22] 18 (04/03 1721) ?BP: (118-162)/(52-76) 156/67 (04/03 1721) ?SpO2:  [96 %-100 %] 100 % (04/03 1721) ?FiO2 (%):  [40 %] 40 % (04/03 1328) ?Weight:  [80.2 kg] 80.2 kg (04/03 0500) ? ?General: lying in bed, NAD ?Neuro: opens eyes to tactile stimulation but doesn't follow commands, pupils sluggishly reacting to light, doesn't track examiner, doesn't blink to threat, withdraws to noxious stimuli in all extremities except no movement in LUE ? ?Basic Metabolic Panel: ?Recent Labs  ?Lab 09/17/2021 ?1838 10/05/21 ?7416 10/05/21 ?3845 10/05/21 ?1544 10/06/21 ?0154 10/06/21 ?3646 10/06/21 ?1439 10/06/21 ?1909 10/07/21 ?8032 10/08/21 ?0225  ?NA 139 136  --   --  135  --   --   --  136 139  ?K 5.7* 5.7*   < > 5.2* 3.7 4.1 4.3 4.3 4.2 3.7  ?CL 104 100  --   --  95*  --   --   --  98 97*  ?CO2 22 22  --   --  26  --   --   --  23 26  ?GLUCOSE 61* 180*  --   --  185*  --   --   --  239* 325*  ?BUN 39* 41*  --   --  22*  --   --   --  47* 30*  ?CREATININE 5.44* 5.87*  --   --  4.00*  --   --   --  5.47* 3.74*  ?CALCIUM 8.1* 8.2*  --   --  7.8*  --   --   --  7.4* 8.1*  ?MG  --   --   --  1.7 1.7  --   --  1.6* 1.8  --   ?PHOS  --   --   --  6.7* 4.7*  --   --  5.7* 5.9*  --   ? < > = values in this interval not displayed.  ? ? ?CBC: ?Recent Labs  ?Lab 10/06/2021 ?1021 10/05/21 ?1224 10/06/21 ?8250 10/07/21 ?0370 10/08/21 ?0225  ?WBC 11.3* 7.7 9.7 9.2 8.4  ?NEUTROABS 9.4*  --   --   --   --   ?HGB 11.2* 10.2* 9.3* 9.3* 9.0*  ?HCT 36.1 30.7* 28.8* 28.0* 27.5*  ?MCV 110.4* 105.1* 104.7* 105.7* 105.4*  ?PLT 229 199 207 184 187  ? ? ? ?Coagulation Studies: ?No results for input(s): LABPROT, INR in the last 72 hours. ? ?Imaging ? No new brain imaging  overnight ? ?ASSESSMENT AND PLAN: 51 year old female with seizures and altered mental status in the setting of systolic blood pressure 488 which has since improved. ?  ?Seizure ?Posterior reversible encephalopathy syndrome (PRES) ?Acute encephalopathy due to PRES, seizures ?-No definite seizures overnight. EEG showing generalized periodic discharges at 2 Hz which is on the ictal-interictal continuum-Provoked seizures due to PRES ?- Encephalopathy appears to be out of proportion to imaging findings, drug screen still pending ?  ?Recommendations ?-Increase Keppra to 750 mg twice daily, cant increase more due to renal dysfunction ?- Cotinue vimpat 75 mg BID ?-Continue LTM EEG to look for intermittent seizures. Likely dc tomorrow if  no sz overnight ?-Continue seizure precautions ?-As needed IV Ativan 2  mg for clinical seizure-like activity ?-Management of rest of comorbidities per primary team ?  ?I have spent a total of  25 minutes with the patient reviewing hospital notes,  test results, labs and examining the patient as well as establishing an assessment and plan..  > 50% of time was spent in direct patient care. ? ?Zeb Comfort ?Epilepsy ?[facility] ?For questions after 5pm please refer to AMION to reach the Neurologist on call ? ?

## 2021-10-09 NOTE — Progress Notes (Signed)
Subjective: Continues to be comatose, on pressor support ? ?ROS: Unable to obtain due to poor mental status ? ?EEG past 24 hours showed continuous generalized 3-7 hz theta-delta slowing.  Generalized periodic discharges with triphasic morphology were also noted at 2.5 Hz, more prominent when awake/stimulated. This is on the ictal-interictal continuum with high potential for seizure recurrence. EEG overall worse than yesterday.  ? ?Examination ? ?Vital signs in last 24 hours: ?Temp:  [98.5 ?F (36.9 ?C)-100.2 ?F (37.9 ?C)] 99 ?F (37.2 ?C) (04/03 0423) ?Pulse Rate:  [82-95] 95 (04/03 0500) ?Resp:  [16-22] 18 (04/03 0500) ?BP: (118-179)/(52-76) 162/76 (04/03 0500) ?SpO2:  [98 %-100 %] 100 % (04/03 0500) ?FiO2 (%):  [40 %-60 %] 40 % (04/03 0400) ?Weight:  [80.2 kg] 80.2 kg (04/03 0500) ? ?General: lying in bed, NAD ?Neuro: Patient flutters eyes today to noxious stimuli, improved from yesterday, left pupil does not appear to be reacting to light, right pupil sluggishly reacting to light, roving eye movements, corneal reflex absent, withdrawal to noxious stimuli in all extremities. She is no longer posturing. ? ?Basic Metabolic Panel: ?Recent Labs  ?Lab 09/18/2021 ?1838 10/05/21 ?3220 10/05/21 ?2542 10/05/21 ?1544 10/06/21 ?0154 10/06/21 ?7062 10/06/21 ?1439 10/06/21 ?1909 10/07/21 ?3762 10/08/21 ?0225  ?NA 139 136  --   --  135  --   --   --  136 139  ?K 5.7* 5.7*   < > 5.2* 3.7 4.1 4.3 4.3 4.2 3.7  ?CL 104 100  --   --  95*  --   --   --  98 97*  ?CO2 22 22  --   --  26  --   --   --  23 26  ?GLUCOSE 61* 180*  --   --  185*  --   --   --  239* 325*  ?BUN 39* 41*  --   --  22*  --   --   --  47* 30*  ?CREATININE 5.44* 5.87*  --   --  4.00*  --   --   --  5.47* 3.74*  ?CALCIUM 8.1* 8.2*  --   --  7.8*  --   --   --  7.4* 8.1*  ?MG  --   --   --  1.7 1.7  --   --  1.6* 1.8  --   ?PHOS  --   --   --  6.7* 4.7*  --   --  5.7* 5.9*  --   ? < > = values in this interval not displayed.  ? ? ? ?CBC: ?Recent Labs  ?Lab  09/11/2021 ?1021 10/05/21 ?8315 10/06/21 ?1761 10/07/21 ?6073 10/08/21 ?0225  ?WBC 11.3* 7.7 9.7 9.2 8.4  ?NEUTROABS 9.4*  --   --   --   --   ?HGB 11.2* 10.2* 9.3* 9.3* 9.0*  ?HCT 36.1 30.7* 28.8* 28.0* 27.5*  ?MCV 110.4* 105.1* 104.7* 105.7* 105.4*  ?PLT 229 199 207 184 187  ? ? ? ? ?Coagulation Studies: ?No results for input(s): LABPROT, INR in the last 72 hours. ? ? ?Imaging ? ?MR Brain  wo contrast 10/05/2021: Increased T2 hyperintensities within the posterior temporal, parietal, and occipital cortex bilaterally with mild restricted ?diffusion. Findings are most consistent with posterior reversible ?encephalopathy syndrome.No other acute intracranial abnormality. T2 hyperintensities within the central pons bilaterally likely reflect the sequela of chronic microvascular ischemia. Bilateral mastoid effusions. No obstructing nasopharyngeal lesion is present. Left maxillary, posterior ethmoid, and left sphenoid sinus  disease. ? ? ? ?ASSESSMENT AND PLAN: 51 year old female with seizures and altered mental status in the setting of systolic blood pressure 151 which has since improved. ?  ?Seizure ?Posterior reversible encephalopathy syndrome (PRES) ?Acute encephalopathy due to PRES, seizures ?-No definite seizures overnight. EEG showing generalized periodic discharges at 2-2.5 Hz w/ triphasic morphology which is on the ictal-interictal continuum-Provoked seizures due to PRES ?- Encephalopathy appears to be out of proportion to imaging findings, drug screen pending ?  ?Recommendations ?- Continue Keppra to 750 mg twice daily, cant increase more due to renal dysfunction ?- Add vimpat: '200mg'$  IV load f/b '75mg'$  bid (ESRD dosing, she has some room to go up on this if needed) ?-Continue LTM EEG to look for intermittent seizures ?-Continue seizure precautions ?-As needed IV Ativan 2 mg for clinical seizure-like activity ?-Management of rest of comorbidities per primary team ? ?Su Monks, MD ?[facility] ?[phone] ? ?If 7pm- 7am, please page neurology on call as listed in [place]. ? ? ?

## 2021-10-10 ENCOUNTER — Inpatient Hospital Stay (HOSPITAL_COMMUNITY): Payer: Medicaid Other

## 2021-10-10 DIAGNOSIS — I159 Secondary hypertension, unspecified: Secondary | ICD-10-CM | POA: Diagnosis not present

## 2021-10-10 DIAGNOSIS — N186 End stage renal disease: Secondary | ICD-10-CM

## 2021-10-10 DIAGNOSIS — E1165 Type 2 diabetes mellitus with hyperglycemia: Secondary | ICD-10-CM

## 2021-10-10 DIAGNOSIS — I6783 Posterior reversible encephalopathy syndrome: Secondary | ICD-10-CM | POA: Diagnosis not present

## 2021-10-10 DIAGNOSIS — R569 Unspecified convulsions: Secondary | ICD-10-CM | POA: Diagnosis not present

## 2021-10-10 DIAGNOSIS — J9600 Acute respiratory failure, unspecified whether with hypoxia or hypercapnia: Secondary | ICD-10-CM | POA: Diagnosis not present

## 2021-10-10 DIAGNOSIS — R4182 Altered mental status, unspecified: Secondary | ICD-10-CM | POA: Diagnosis not present

## 2021-10-10 LAB — CBC
HCT: 27.6 % — ABNORMAL LOW (ref 36.0–46.0)
Hemoglobin: 9 g/dL — ABNORMAL LOW (ref 12.0–15.0)
MCH: 34.5 pg — ABNORMAL HIGH (ref 26.0–34.0)
MCHC: 32.6 g/dL (ref 30.0–36.0)
MCV: 105.7 fL — ABNORMAL HIGH (ref 80.0–100.0)
Platelets: 170 10*3/uL (ref 150–400)
RBC: 2.61 MIL/uL — ABNORMAL LOW (ref 3.87–5.11)
RDW: 16.5 % — ABNORMAL HIGH (ref 11.5–15.5)
WBC: 10.9 10*3/uL — ABNORMAL HIGH (ref 4.0–10.5)
nRBC: 0 % (ref 0.0–0.2)

## 2021-10-10 LAB — GLUCOSE, CAPILLARY
Glucose-Capillary: 142 mg/dL — ABNORMAL HIGH (ref 70–99)
Glucose-Capillary: 145 mg/dL — ABNORMAL HIGH (ref 70–99)
Glucose-Capillary: 156 mg/dL — ABNORMAL HIGH (ref 70–99)
Glucose-Capillary: 135 mg/dL — ABNORMAL HIGH (ref 70–99)
Glucose-Capillary: 137 mg/dL — ABNORMAL HIGH (ref 70–99)
Glucose-Capillary: 127 mg/dL — ABNORMAL HIGH (ref 70–99)
Glucose-Capillary: 164 mg/dL — ABNORMAL HIGH (ref 70–99)
Glucose-Capillary: 134 mg/dL — ABNORMAL HIGH (ref 70–99)
Glucose-Capillary: 198 mg/dL — ABNORMAL HIGH (ref 70–99)
Glucose-Capillary: 243 mg/dL — ABNORMAL HIGH (ref 70–99)
Glucose-Capillary: 273 mg/dL — ABNORMAL HIGH (ref 70–99)

## 2021-10-10 LAB — BASIC METABOLIC PANEL
Anion gap: 16 — ABNORMAL HIGH (ref 5–15)
BUN: 70 mg/dL — ABNORMAL HIGH (ref 6–20)
CO2: 22 mmol/L (ref 22–32)
Calcium: 8.5 mg/dL — ABNORMAL LOW (ref 8.9–10.3)
Chloride: 103 mmol/L (ref 98–111)
Creatinine, Ser: 6.41 mg/dL — ABNORMAL HIGH (ref 0.44–1.00)
GFR, Estimated: 7 mL/min — ABNORMAL LOW (ref >60)
Glucose, Bld: 156 mg/dL — ABNORMAL HIGH (ref 70–99)
Potassium: 4.5 mmol/L (ref 3.5–5.1)
Sodium: 141 mmol/L (ref 135–145)

## 2021-10-10 LAB — CULTURE, BLOOD (ROUTINE X 2)
Culture: NO GROWTH
Culture: NO GROWTH
Special Requests: ADEQUATE

## 2021-10-10 LAB — VITAMIN B1

## 2021-10-10 LAB — MAGNESIUM: Magnesium: 2.1 mg/dL (ref 1.7–2.4)

## 2021-10-10 IMAGING — DX DG CHEST 1V PORT
1 series · 1 of 1 positions shown · non-contrast
Comparison: Chest x-ray dated [DATE].

CLINICAL DATA: Respiratory failure.

EXAM:
PORTABLE CHEST 1 VIEW

[chest]
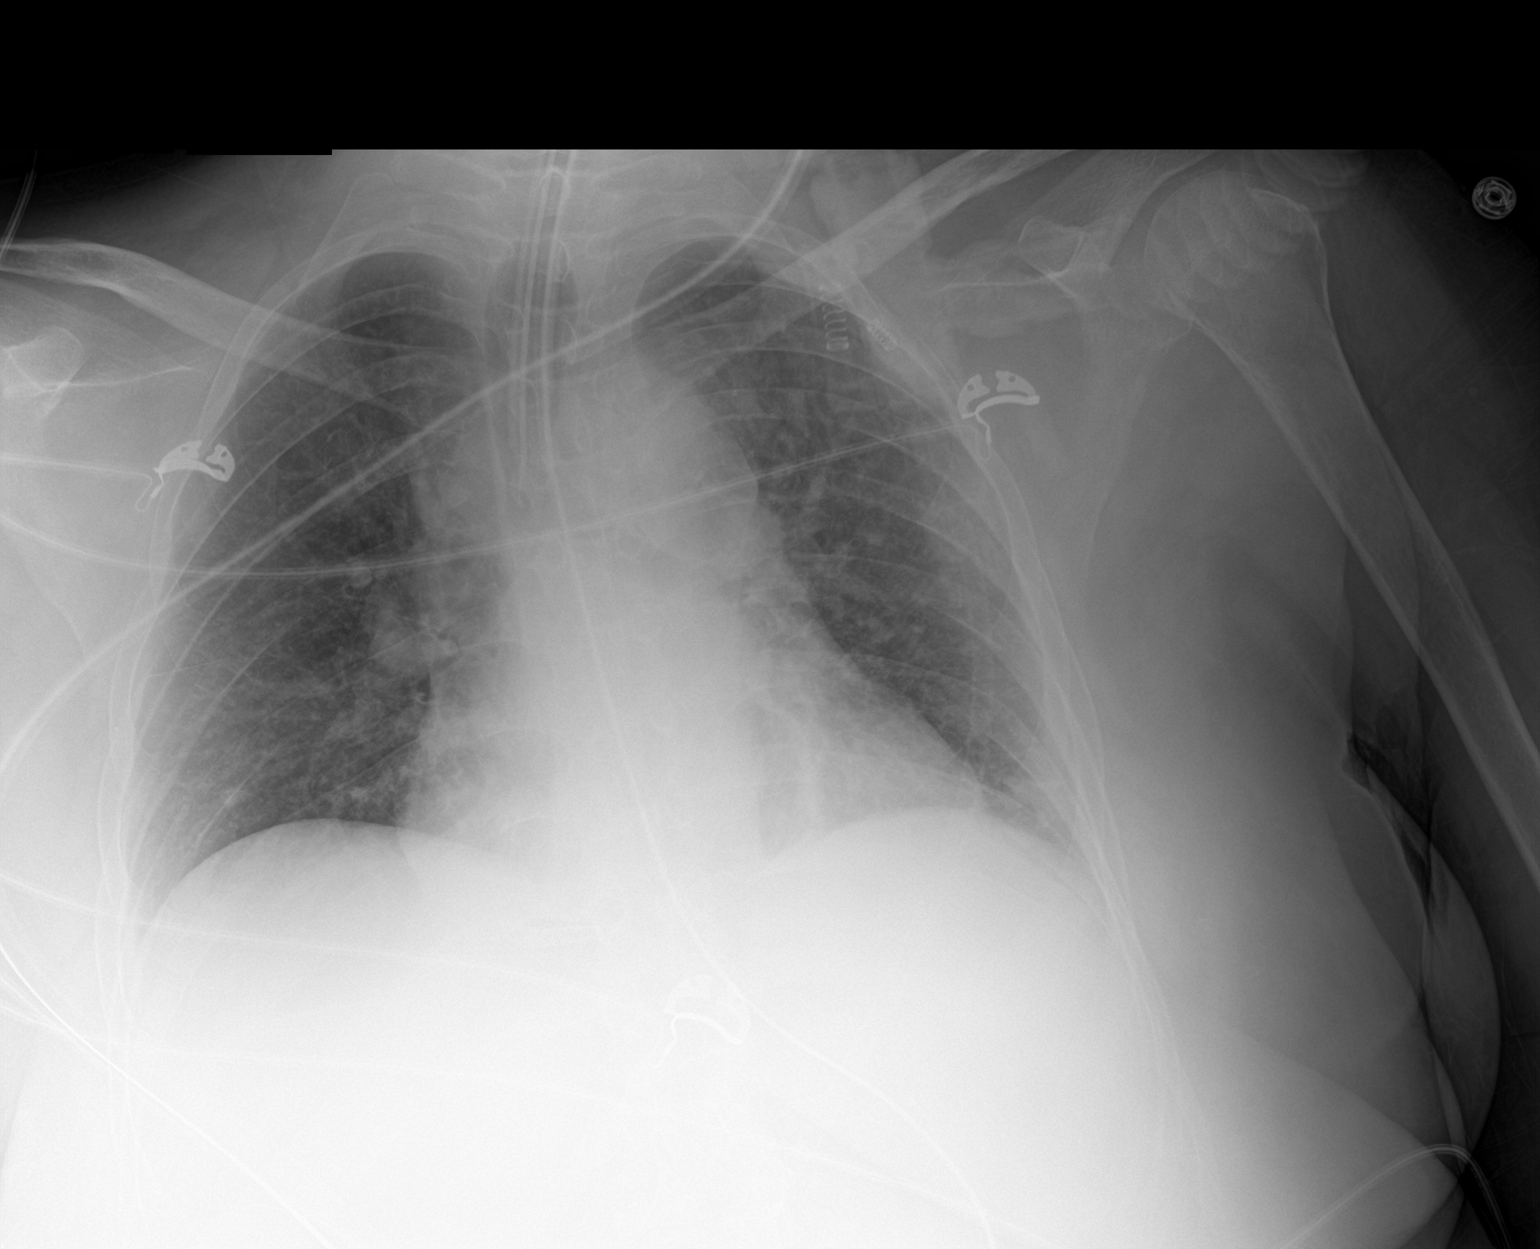

[1 of 1 positions shown; findings below may reference images not displayed]

FINDINGS: Unchanged endotracheal and enteric tubes. Stable cardiomediastinal
silhouette. Unchanged mild left basilar atelectasis. No focal
consolidation, pleural effusion, or pneumothorax. No acute osseous
abnormality.
IMPRESSION: 1. Unchanged mild left basilar atelectasis.

## 2021-10-10 MED ORDER — INSULIN ASPART 100 UNIT/ML IJ SOLN
5.0000 [IU] | INTRAMUSCULAR | Status: DC
  Administered 2021-10-10 (×3): 5 [IU] via SUBCUTANEOUS

## 2021-10-10 MED ORDER — HEPARIN SODIUM (PORCINE) 1000 UNIT/ML DIALYSIS
2000.0000 [IU] | INTRAMUSCULAR | Status: DC | PRN
  Administered 2021-10-10: 2000 [IU] via INTRAVENOUS_CENTRAL

## 2021-10-10 MED ORDER — INSULIN DETEMIR 100 UNIT/ML ~~LOC~~ SOLN
26.0000 [IU] | Freq: Two times a day (BID) | SUBCUTANEOUS | Status: DC
  Administered 2021-10-10 – 2021-10-11 (×4): 26 [IU] via SUBCUTANEOUS
  Filled 2021-10-10 (×5): qty 0.26

## 2021-10-10 MED ORDER — INSULIN ASPART 100 UNIT/ML IJ SOLN
8.0000 [IU] | INTRAMUSCULAR | Status: DC
  Administered 2021-10-10 – 2021-10-11 (×2): 8 [IU] via SUBCUTANEOUS

## 2021-10-10 MED ORDER — CARVEDILOL 25 MG PO TABS
25.0000 mg | ORAL_TABLET | Freq: Two times a day (BID) | ORAL | Status: DC
  Administered 2021-10-10 – 2021-10-24 (×26): 25 mg
  Filled 2021-10-10 (×29): qty 1

## 2021-10-10 MED ORDER — INSULIN ASPART 100 UNIT/ML IJ SOLN
1.0000 [IU] | INTRAMUSCULAR | Status: DC
  Administered 2021-10-10: 1 [IU] via SUBCUTANEOUS
  Administered 2021-10-10: 3 [IU] via SUBCUTANEOUS
  Administered 2021-10-10: 2 [IU] via SUBCUTANEOUS
  Administered 2021-10-10: 3 [IU] via SUBCUTANEOUS
  Administered 2021-10-10: 2 [IU] via SUBCUTANEOUS
  Administered 2021-10-11: 3 [IU] via SUBCUTANEOUS

## 2021-10-10 MED ORDER — INSULIN ASPART 100 UNIT/ML IJ SOLN
9.0000 [IU] | INTRAMUSCULAR | Status: DC
  Administered 2021-10-10: 9 [IU] via SUBCUTANEOUS

## 2021-10-10 MED ORDER — LOPERAMIDE HCL 1 MG/7.5ML PO SUSP
2.0000 mg | ORAL | Status: DC | PRN
  Administered 2021-10-11 – 2021-10-22 (×6): 2 mg
  Filled 2021-10-10 (×8): qty 15

## 2021-10-10 MED ORDER — DEXTROSE 10 % IV SOLN: INTRAVENOUS | Status: DC | PRN

## 2021-10-10 NOTE — Progress Notes (Signed)
Admit: 09/24/2021 ?LOS: 6 ? ?74F ESRD found unresponsive at home, mild hyperkalemia, + PRES ? ?Subjective:  ?No sig improvements or changes neurologically ? ?04/03 0701 - 04/04 0700 ?In: 3129.1 [I.V.:1461.2; NG/GT:1370; IV Piggyback:297.9] ?Out: 725 [Urine:250; Stool:475] ? ?Filed Weights  ? 10/09/21 0500 10/10/21 0500 10/10/21 0900  ?Weight: 80.2 kg 83.3 kg 83.3 kg  ? ? ?Scheduled Meds: ? aspirin  81 mg Per Tube Daily  ? atorvastatin  80 mg Per Tube q1800  ? carvedilol  25 mg Per Tube BID WC  ? chlorhexidine gluconate (MEDLINE KIT)  15 mL Mouth Rinse BID  ? Chlorhexidine Gluconate Cloth  6 each Topical Q0600  ? ezetimibe  10 mg Per Tube Daily  ? feeding supplement (PROSource TF)  45 mL Per Tube QID  ? fiber  1 packet Per Tube TID  ? folic acid  1 mg Per Tube Daily  ? heparin injection (subcutaneous)  5,000 Units Subcutaneous Q8H  ? hydrALAZINE  10 mg Per Tube BID  ? insulin aspart  1-3 Units Subcutaneous Q4H  ? insulin aspart  5 Units Subcutaneous Q4H  ? insulin detemir  26 Units Subcutaneous Q12H  ? leptospermum manuka honey  1 application. Topical UD  ? levothyroxine  50 mcg Per Tube QAC breakfast  ? mouth rinse  15 mL Mouth Rinse 10 times per day  ? multivitamin  1 tablet Per Tube QHS  ? pantoprazole sodium  40 mg Per Tube Daily  ? [START ON 10/12/2021] thiamine injection  100 mg Intravenous Daily  ? ?Continuous Infusions: ? sodium chloride 10 mL/hr at 10/10/21 1000  ? sodium chloride    ? sodium chloride    ? sodium chloride    ? dextrose    ? feeding supplement (VITAL 1.5 CAL) 1,000 mL (10/10/21 0500)  ? lacosamide (VIMPAT) IV 75 mg (10/10/21 1045)  ? levETIRAcetam 750 mg (10/10/21 1201)  ? thiamine injection Stopped (10/09/21 2010)  ? ?PRN Meds:.sodium chloride, sodium chloride, acetaminophen, dextrose, docusate, fentaNYL (SUBLIMAZE) injection, heparin, [START ON 10/11/2021] heparin, hydrALAZINE, loperamide HCl, LORazepam, midazolam, polyethylene glycol ? ?Current Labs: reviewed  ? ? ?Physical Exam:  Blood  pressure (!) 169/78, pulse 100, temperature 100.3 ?F (37.9 ?C), temperature source Oral, resp. rate (!) 21, height _0  (1.6 m), weight 83.3 kg, last menstrual period 12/17/2016, SpO2 98 %. ?GEN: Intubated, eyes open to voice w/ R sided gaze ?ENT: ET tube in oropharynx ?EYES: Eyes closed ?CV: Regular, normal S1 and S2 ?PULM: Coarse breath sounds bilaterally ?ABD: Soft ?SKIN: No rashes or lesions ?EXT: no sig edema  ?VASCULAR: LUA AVF +bruit ? ? ? OP HD: TTS [facility] ?  LUE AVF ? - 3/30 here > hep B Ag negative and hep B Ab's low/ not protective ? ?CXR 3/30 - no active disease ? ?Assessment/ Plan:  ?PRES: Found unresponsive at home / AMS, neuro following, not improving; will be long process ?VDRF- for airway protection ?ESRD - on TTS [facility]. HD today.  ?Volume - no vol overload, lower UFG to 1-2L. ?DM2 - per pmd ?Possible sepsis - started on Ceftriaxone; BCx x2 NGTD ?Anemia - Hb 9s stable CTM ?CKD-BMM: Ca stable, trend P ? ? ?Kelly Splinter, MD ?10/10/2021, 12:02 PM ? ? ?General ESRD medication Issues: ?Preferred narcotic agents for pain control are hydromorphone, fentanyl, and methadone. Morphine should not be used.  ?Baclofen should be avoided ?Avoid oral sodium phosphate and magnesium citrate based laxatives / bowel preps  ? ?Recent Labs  ?Lab 10/06/21 ?  0154 10/06/21 ?0658 10/06/21 ?1909 10/07/21 ?8421 10/08/21 ?0225 10/10/21 ?0645  ?NA 135  --   --  136 139 141  ?K 3.7   < > 4.3 4.2 3.7 4.5  ?CL 95*  --   --  98 97* 103  ?CO2 26  --   --  _0 ?GLUCOSE 185*  --   --  239* 325* 156*  ?BUN 22*  --   --  47* 30* 70*  ?CREATININE 4.00*  --   --  5.47* 3.74* 6.41*  ?CALCIUM 7.8*  --   --  7.4* 8.1* 8.5*  ?PHOS 4.7*  --  5.7* 5.9*  --   --   ? < > = values in this interval not displayed.  ? ? ?Recent Labs  ?Lab 10/03/2021 ?1021 10/05/21 ?0312 10/07/21 ?8118 10/08/21 ?0225 10/10/21 ?0645  ?WBC 11.3*   < > 9.2 8.4 10.9*  ?NEUTROABS 9.4*  --   --   --   --   ?HGB 11.2*   < > 9.3* 9.0* 9.0*  ?HCT 36.1    < > 28.0* 27.5* 27.6*  ?MCV 110.4*   < > 105.7* 105.4* 105.7*  ?PLT 229   < > 184 187 170  ? < > = values in this interval not displayed.  ? ? ? ? ? ? ? ? ? ? ?  ?

## 2021-10-10 NOTE — Progress Notes (Signed)
eLink Physician-Brief Progress Note ?Patient Name: Melanie Hall ?DOB: [birth year] ?MRN: [id number] ? ? ?Date of Service ? 10/10/2021  ?HPI/Events of Note ? Agitation - Nursing request for bilateral soft wrist restraints.   ?eICU Interventions ? Will order bilateral soft wrist restraints x 8 hours.   ? ? ? ?Intervention Category ?Major Interventions: Delirium, psychosis, severe agitation - evaluation and management ? ?Monique Hefty Cornelia Copa ?10/10/2021, 1:57 AM ?

## 2021-10-10 NOTE — Progress Notes (Signed)
eLink Physician-Brief Progress Note ?Patient Name: Melanie Hall ?DOB: 1971/02/19 ?MRN: [id number] ? ? ?Date of Service ? 10/10/2021  ?HPI/Events of Note ? Hyperglycemia - Blood glucose = 243.   ?eICU Interventions ? Will increase tube feed coverage from 5 units to 8 units Lindsay Q 4 hours.  ? ? ? ?Intervention Category ?Major Interventions: Hyperglycemia - active titration of insulin therapy ? ?Melanie Hall ?10/10/2021, 8:20 PM ?

## 2021-10-10 NOTE — Progress Notes (Signed)
Subjective: No seizure-like episodes overnight.  Has been agitated at times. ? ?ROS: Unable to obtain due to poor mental status ? ?Examination ? ?Vital signs in last 24 hours: ?Temp:  [98.5 ?F (36.9 ?C)-100.3 ?F (37.9 ?C)] 99.1 ?F (37.3 ?C) (04/04 0900) ?Pulse Rate:  [76-102] 100 (04/04 1115) ?Resp:  [16-25] 21 (04/04 1115) ?BP: (115-169)/(55-78) 169/78 (04/04 1115) ?SpO2:  [95 %-100 %] 98 % (04/04 1115) ?FiO2 (%):  [40 %] 40 % (04/04 0900) ?Weight:  [83.3 kg] 83.3 kg (04/04 0900) ? ?General: lying in bed, NAD ?Neuro: winces to noxious stimulation but doesn't open eyes or follow commands, pupils sluggishly reacting to light, no forced gaze deviation, withdraws to noxious stimuli in all extremities  ? ?Basic Metabolic Panel: ?Recent Labs  ?Lab 10/05/21 ?6606 10/05/21 ?3016 10/05/21 ?1544 10/06/21 ?0154 10/06/21 ?0109 10/06/21 ?1439 10/06/21 ?1909 10/07/21 ?3235 10/08/21 ?0225 10/10/21 ?0645  ?NA 136  --   --  135  --   --   --  136 139 141  ?K 5.7*   < > 5.2* 3.7   < > 4.3 4.3 4.2 3.7 4.5  ?CL 100  --   --  95*  --   --   --  98 97* 103  ?CO2 22  --   --  26  --   --   --  '23 26 22  '$ ?GLUCOSE 180*  --   --  185*  --   --   --  239* 325* 156*  ?BUN 41*  --   --  22*  --   --   --  47* 30* 70*  ?CREATININE 5.87*  --   --  4.00*  --   --   --  5.47* 3.74* 6.41*  ?CALCIUM 8.2*  --   --  7.8*  --   --   --  7.4* 8.1* 8.5*  ?MG  --   --  1.7 1.7  --   --  1.6* 1.8  --  2.1  ?PHOS  --   --  6.7* 4.7*  --   --  5.7* 5.9*  --   --   ? < > = values in this interval not displayed.  ? ? ?CBC: ?Recent Labs  ?Lab 10/06/2021 ?1021 10/05/21 ?5732 10/06/21 ?2025 10/07/21 ?4270 10/08/21 ?0225 10/10/21 ?0645  ?WBC 11.3* 7.7 9.7 9.2 8.4 10.9*  ?NEUTROABS 9.4*  --   --   --   --   --   ?HGB 11.2* 10.2* 9.3* 9.3* 9.0* 9.0*  ?HCT 36.1 30.7* 28.8* 28.0* 27.5* 27.6*  ?MCV 110.4* 105.1* 104.7* 105.7* 105.4* 105.7*  ?PLT 229 199 207 184 187 170  ? ? ? ?Coagulation Studies: ?No results for input(s): LABPROT, INR in the last 72 hours. ? ?Imaging ?  No new brain imaging overnight ?  ?ASSESSMENT AND PLAN: 51 year old female with seizures and altered mental status in the setting of systolic blood pressure 623 which has since improved. ?  ?Seizure ?Posterior reversible encephalopathy syndrome (PRES) ?Acute encephalopathy due to PRES, seizures ?-No definite seizures overnight. Provoked seizures due to PRES ?- Encephalopathy appears to be out of proportion to imaging findings, drug screen still pending ?  ?Recommendations ?-Continue Keppra 750 mg twice daily, cant increase more due to renal dysfunction ?- Cotinue vimpat 75 mg BID ?- Discontinue LTM EEG as no seizures overnight ?-Continue seizure precautions ?-As needed IV Ativan 2 mg for clinical seizure-like activity ?-Management of rest of comorbidities per primary team ?  ?  I have spent a total of  25 minutes with the patient reviewing hospital notes,  test results, labs and examining the patient as well as establishing an assessment and plan. > 50% of time was spent in direct patient care. ? ?Zeb Comfort ?Epilepsy ?[facility] ?For questions after 5pm please refer to AMION to reach the Neurologist on call ? ?

## 2021-10-10 NOTE — Progress Notes (Signed)
eLink Physician-Brief Progress Note ?Patient Name: Melanie Hall ?DOB: 25-Aug-1970 ?MRN: [id number] ? ? ?Date of Service ? 10/10/2021  ?HPI/Events of Note ? EndoTool prompting for transition orders off of insulin IV infusion.   ?eICU Interventions ? Plan: ?Will transition to Levemir + Q 4 hour sensitive Novolog SSI + tube feeding Novolog coverage per IV infusion transition order set protocol.   ? ? ? ?Intervention Category ?Major Interventions: Hyperglycemia - active titration of insulin therapy ? ?Mack Thurmon Cornelia Copa ?10/10/2021, 2:33 AM ?

## 2021-10-10 NOTE — Progress Notes (Addendum)
? ?NAME:  Melanie Hall, MRN:  [id number], DOB:  12/04/1970, LOS: 6 ?ADMISSION DATE:  09/15/2021, CONSULTATION DATE:  09/25/2021 ?REFERRING MD:  Dr. Roderic Palau, ER, CHIEF COMPLAINT:  AMS  ? ?History of Present Illness:  ?51 yo female former smoker with hx of ESRD on HD was found at her home by patient transport driver unresponsive.  Pt's neighbor noted that she was tapping her hands for about a minute, and then stopped.  She has snoring and drooling.  No improvement after narcan.  CBG normal.  Noted to have BP 215/96.  Intubated in [facility] for airway protection, given keppra load, started on diprivan, and started on antibiotics.  CT head negative for acute findings.  Treated for hyperkalemia.  Pt's daughter report she had similar episode on 09/24/21 and EMS was called to the house, but episode resolve and patient declined hospital evaluation.  No prior history of seizures or known head injury.  ER team d/w case with neurology and advised to transfer to [facility] for EEG monitoring and further neurology assessment. ? ?Hx from medical team, patients daughter at bedside, and medical records. ? ?Pertinent  Medical History  ?CHF, Anemia, OA, CAD, DM type 2, ESRD on HD, HLD, HTN, CVA ? ?Significant Hospital Events: ?Including procedures, antibiotic start and stop dates in addition to other pertinent events   ?3/29 present to [facility], intubated, transfer to [facility] ?3/30 MRI Brain consistent with PRES ?4/3 off sedation, not waking up ?4/4 transitioned off insulin gtt to SSI ? ?Interim History / Subjective:  ?Off sedation; not waking up ?Remains intubated on mech vent ? ?Objective   ?Blood pressure (!) 158/69, pulse 92, temperature 98.5 ?F (36.9 ?C), temperature source Oral, resp. rate 18, height '5\' 3"'$  (1.6 m), weight 83.3 kg, last menstrual period 12/17/2016, SpO2 100 %. ?   ?Vent Mode: PRVC ?FiO2 (%):  [40 %] 40 % ?Set Rate:  [18 bmp] 18 bmp ?Vt Set:  [420 mL] 420 mL ?PEEP:  [5 cmH20] 5 cmH20 ?Pressure Support:  [8 cmH20] 8 cmH20 ?Plateau  Pressure:  [13 cmH20-16 cmH20] 13 cmH20  ? ?Intake/Output Summary (Last 24 hours) at 10/10/2021 0759 ?Last data filed at 10/10/2021 0700 ?Gross per 24 hour  ?Intake 3129.07 ml  ?Output 725 ml  ?Net 2404.07 ml  ? ? ?Filed Weights  ? 10/08/21 0406 10/09/21 0500 10/10/21 0500  ?Weight: 80.2 kg 80.2 kg 83.3 kg  ? ?Physical Exam: ?General:  critically ill appearing on mech vent ?HEENT: MM pink/moist; ETT in place ?Neuro: pupils 4 mm bilaterally sluggish to light; no corneal reflex; some mvt of lower LLE involuntarily; ?CV: s1s2, no m/r/g ?PULM:  dim clear BS bilaterally; on mech vent PRVC ?GI: soft, bsx4 active  ?Extremities: warm/dry, no edema  ?Skin: no rashes or lesions  ? ?Resolved Hospital Problem list   ? ? ?Assessment & Plan:  ?Posterior reversible encephalopathy syndrome with associated seizures ?Acute metabolic encephalopathy 2/2 PRES ?MRI brain consistent with PRES. No seizures seen on spot EEG but showed cortical dysfunction from the left hemisphere.  CT head did not show any acute intracranial hemorrhage/mass/lesion.  No dural sinus thrombus. There are mild stenosis of the left ICA but should not cause her issue. CVA should not cause this extent of encephalopathy and a large stroke should appear on CT. ?P: ?-neuro following; appreciate recs ?-neuro consider stopping cEEG ?-cont keppra, vimpat ?-prn ativan for seizures ?-cont thiamine, folic acid, mvi ?-limit sedating meds for frequent neuro checks ? ?Acute respiratory failure secondary to probable  aspiration PNA ?-completed rocephin x 5 days 4/2 ?P: ?-cont mech vent PRVC 4-8 cc/kg ?-wean fio2 for sats >92% ?-VAP prevention in place ?-Daily SBT/SAT when able; not able to extubate due to mental status ?-continue discussions about tracheostomy if unable to come off vent ? ?ESRD on iHD. ?Hyperkalemia -resolved ?P: ?-nephro following; plan for iHD today ?-Trend BMP / urinary output ?-Replace electrolytes as indicated ?-Avoid nephrotoxic agents, ensure adequate renal  perfusion ? ?Hx of HTN, chronic HFrEF, CAD, HLD. ?P: ?-SBP range 115-155 ?-increasing carvedilol to 25 BID ?-cont hydralazine 10 mg BID ?-cont ASA, statin, zetia ?-daily weights; strict I/o's ? ?DM type 2 poorly controlled with hyperglycemia. ?P: ?-off insulin gtt per endotool ?-SSI and cbg monitoring ?-cont TF coverage and basal insulin ? ?Hx of hypothyroidism.. TSH WNL ?P: ?-cont home synthroid ? ?Stage 1 sacral pressure injury and deep tissue pressure injury right Toe, present on admission  ?P: ?-cont wound care ? ? ?Best Practice (right click and "Reselect all SmartList Selections" daily)  ? ?Diet/type: tubefeeds ?DVT prophylaxis: prophylactic heparin  ?GI prophylaxis: PPI ?Lines: N/A ?Foley:  N/A ?Code Status:  full code ?Last date of multidisciplinary goals of care discussion [3/31] Updated daughter, [name], on patient's critical condition. Remains full code. Ongoing goals of care discussions with family.  At this point looks like this would be a prolonged recovery with unknown residual deficits.  Anticipate that she would likely not be able to live independently in the community in the future without significant assistance.  ?4/4 attempted to call [name] to update over phone but no answer ?Labs   ?CBC: ?Recent Labs  ?Lab 09/22/2021 ?1021 10/05/21 ?3235 10/06/21 ?5732 10/07/21 ?2025 10/08/21 ?0225 10/10/21 ?0645  ?WBC 11.3* 7.7 9.7 9.2 8.4 10.9*  ?NEUTROABS 9.4*  --   --   --   --   --   ?HGB 11.2* 10.2* 9.3* 9.3* 9.0* 9.0*  ?HCT 36.1 30.7* 28.8* 28.0* 27.5* 27.6*  ?MCV 110.4* 105.1* 104.7* 105.7* 105.4* 105.7*  ?PLT 229 199 207 184 187 170  ? ? ? ?Basic Metabolic Panel: ?Recent Labs  ?Lab 10/05/21 ?4270 10/05/21 ?6237 10/05/21 ?1544 10/06/21 ?0154 10/06/21 ?6283 10/06/21 ?1439 10/06/21 ?1909 10/07/21 ?1517 10/08/21 ?0225 10/10/21 ?0645  ?NA 136  --   --  135  --   --   --  136 139 141  ?K 5.7*   < > 5.2* 3.7   < > 4.3 4.3 4.2 3.7 4.5  ?CL 100  --   --  95*  --   --   --  98 97* 103  ?CO2 22  --   --  26  --    --   --  '23 26 22  '$ ?GLUCOSE 180*  --   --  185*  --   --   --  239* 325* 156*  ?BUN 41*  --   --  22*  --   --   --  47* 30* 70*  ?CREATININE 5.87*  --   --  4.00*  --   --   --  5.47* 3.74* 6.41*  ?CALCIUM 8.2*  --   --  7.8*  --   --   --  7.4* 8.1* 8.5*  ?MG  --   --  1.7 1.7  --   --  1.6* 1.8  --  2.1  ?PHOS  --   --  6.7* 4.7*  --   --  5.7* 5.9*  --   --   ? < > =  values in this interval not displayed.  ? ? ?GFR: ?Estimated Creatinine Clearance: 10.6 mL/min (A) (by C-G formula based on SCr of 6.41 mg/dL (H)). ?Recent Labs  ?Lab 09/26/2021 ?1021 09/13/2021 ?1235 10/05/21 ?2263 10/06/21 ?3354 10/07/21 ?5625 10/08/21 ?0225 10/10/21 ?0645  ?WBC 11.3*  --    < > 9.7 9.2 8.4 10.9*  ?LATICACIDVEN 1.5 1.2  --   --   --   --   --   ? < > = values in this interval not displayed.  ? ? ? ?Liver Function Tests: ?Recent Labs  ?Lab 09/25/2021 ?1021  ?AST 19  ?ALT 12  ?ALKPHOS 144*  ?BILITOT 0.9  ?PROT 7.6  ?ALBUMIN 3.6  ? ? ?No results for input(s): LIPASE, AMYLASE in the last 168 hours. ?Recent Labs  ?Lab 10/05/2021 ?2132  ?AMMONIA 42*  ? ? ?Critical Care time: 35 minutes ? ?Mikki Harbor, PA-C ?[facility] ?10/10/2021, 8:26 AM ? ?Please see [email or web address] for pager details. ? ?From 7A-7P if no response, please call [phone]. ?After hours, please call ELink [phone]. ? ? ?

## 2021-10-10 NOTE — Progress Notes (Signed)
LTM EEG discontinued - no skin breakdown at unhook.   

## 2021-10-10 NOTE — Procedures (Addendum)
Patient Name: Melanie Hall  ?MRN: [id number]  ?Epilepsy Attending: Lora Havens  ?Referring Physician/Provider: Derek Jack, MD ?Duration:  10/09/2021 1722 to 10/10/2021 0902 ?  ?Patient history: 51yo F with ams. EEG to evaluate for seizure ?  ?Level of alertness:  comatose ?  ?AEDs during EEG study: LEV, LCM ?  ?Technical aspects: This EEG study was done with scalp electrodes positioned according to the 10-20 International system of electrode placement. Electrical activity was acquired at a sampling rate of '500Hz'$  and reviewed with a high frequency filter of '70Hz'$  and a low frequency filter of '1Hz'$ . EEG data were recorded continuously and digitally stored.  ?  ?Description:  EEG showed continuous generalized 3-7 hz theta-delta slowing which at times appears sharply contoured.  Intermittent generalized periodic discharges with triphasic morphology were also noted at 1.5 to 2.5 Hz, more prominent when awake/stimulated.  Hyperventilation and photic stimulation were not performed.    ?  ?ABNORMALITY ?- Periodic discharges with triphasic morphology, generalized ?- Continuous slow, generalized ?  ?IMPRESSION: ?This study showed generalized periodic discharges with triphasic morphology which is on the ictal-interictal continuum with high potential for seizures. Additionally there is severe diffuse encephalopathy, nonspecific etiology.  No definite seizures were seen during the study. ?   ?EEG appears to be improving compared to previous day. ? ?Lora Havens  ?

## 2021-10-11 ENCOUNTER — Encounter (HOSPITAL_COMMUNITY): Payer: Medicaid Other

## 2021-10-11 ENCOUNTER — Telehealth (HOSPITAL_COMMUNITY): Payer: Self-pay

## 2021-10-11 ENCOUNTER — Encounter (HOSPITAL_COMMUNITY): Payer: Self-pay

## 2021-10-11 DIAGNOSIS — J9600 Acute respiratory failure, unspecified whether with hypoxia or hypercapnia: Secondary | ICD-10-CM | POA: Diagnosis not present

## 2021-10-11 DIAGNOSIS — M546 Pain in thoracic spine: Secondary | ICD-10-CM

## 2021-10-11 DIAGNOSIS — M6281 Muscle weakness (generalized): Secondary | ICD-10-CM

## 2021-10-11 DIAGNOSIS — R296 Repeated falls: Secondary | ICD-10-CM

## 2021-10-11 DIAGNOSIS — I6783 Posterior reversible encephalopathy syndrome: Secondary | ICD-10-CM | POA: Diagnosis not present

## 2021-10-11 DIAGNOSIS — N186 End stage renal disease: Secondary | ICD-10-CM | POA: Diagnosis not present

## 2021-10-11 DIAGNOSIS — R262 Difficulty in walking, not elsewhere classified: Secondary | ICD-10-CM

## 2021-10-11 DIAGNOSIS — R2689 Other abnormalities of gait and mobility: Secondary | ICD-10-CM

## 2021-10-11 DIAGNOSIS — M545 Low back pain, unspecified: Secondary | ICD-10-CM

## 2021-10-11 DIAGNOSIS — I159 Secondary hypertension, unspecified: Secondary | ICD-10-CM | POA: Diagnosis not present

## 2021-10-11 DIAGNOSIS — Z7189 Other specified counseling: Secondary | ICD-10-CM

## 2021-10-11 LAB — BASIC METABOLIC PANEL
Anion gap: 10 (ref 5–15)
BUN: 39 mg/dL — ABNORMAL HIGH (ref 6–20)
CO2: 26 mmol/L (ref 22–32)
Calcium: 8.5 mg/dL — ABNORMAL LOW (ref 8.9–10.3)
Chloride: 99 mmol/L (ref 98–111)
Creatinine, Ser: 3.92 mg/dL — ABNORMAL HIGH (ref 0.44–1.00)
GFR, Estimated: 13 mL/min — ABNORMAL LOW (ref >60)
Glucose, Bld: 251 mg/dL — ABNORMAL HIGH (ref 70–99)
Potassium: 4.6 mmol/L (ref 3.5–5.1)
Sodium: 135 mmol/L (ref 135–145)

## 2021-10-11 LAB — CBC
HCT: 27.7 % — ABNORMAL LOW (ref 36.0–46.0)
Hemoglobin: 9 g/dL — ABNORMAL LOW (ref 12.0–15.0)
MCH: 34.1 pg — ABNORMAL HIGH (ref 26.0–34.0)
MCHC: 32.5 g/dL (ref 30.0–36.0)
MCV: 104.9 fL — ABNORMAL HIGH (ref 80.0–100.0)
Platelets: 152 10*3/uL (ref 150–400)
RBC: 2.64 MIL/uL — ABNORMAL LOW (ref 3.87–5.11)
RDW: 16.8 % — ABNORMAL HIGH (ref 11.5–15.5)
WBC: 10.3 10*3/uL (ref 4.0–10.5)
nRBC: 0 % (ref 0.0–0.2)

## 2021-10-11 LAB — GLUCOSE, CAPILLARY
Glucose-Capillary: 246 mg/dL — ABNORMAL HIGH (ref 70–99)
Glucose-Capillary: 244 mg/dL — ABNORMAL HIGH (ref 70–99)
Glucose-Capillary: 205 mg/dL — ABNORMAL HIGH (ref 70–99)
Glucose-Capillary: 110 mg/dL — ABNORMAL HIGH (ref 70–99)
Glucose-Capillary: 167 mg/dL — ABNORMAL HIGH (ref 70–99)
Glucose-Capillary: 162 mg/dL — ABNORMAL HIGH (ref 70–99)
Glucose-Capillary: 155 mg/dL — ABNORMAL HIGH (ref 70–99)

## 2021-10-11 MED ORDER — LEVETIRACETAM IN NACL 500 MG/100ML IV SOLN: 500.0000 mg | Freq: Two times a day (BID) | INTRAVENOUS | Status: DC

## 2021-10-11 MED ORDER — LEVETIRACETAM 100 MG/ML PO SOLN
500.0000 mg | Freq: Two times a day (BID) | ORAL | Status: DC
  Administered 2021-10-11 – 2021-10-13 (×5): 500 mg
  Filled 2021-10-11 (×5): qty 5

## 2021-10-11 MED ORDER — INSULIN ASPART 100 UNIT/ML IJ SOLN
12.0000 [IU] | INTRAMUSCULAR | Status: DC
Start: 2021-10-11 — End: 2021-10-13
  Administered 2021-10-11 – 2021-10-13 (×12): 12 [IU] via SUBCUTANEOUS

## 2021-10-11 MED ORDER — INSULIN ASPART 100 UNIT/ML IJ SOLN
0.0000 [IU] | INTRAMUSCULAR | Status: DC
  Administered 2021-10-11 (×2): 4 [IU] via SUBCUTANEOUS
  Administered 2021-10-11: 0 [IU] via SUBCUTANEOUS
  Administered 2021-10-11 (×2): 7 [IU] via SUBCUTANEOUS
  Administered 2021-10-12: 3 [IU] via SUBCUTANEOUS
  Administered 2021-10-12: 4 [IU] via SUBCUTANEOUS
  Administered 2021-10-12 – 2021-10-13 (×5): 3 [IU] via SUBCUTANEOUS
  Administered 2021-10-13: 4 [IU] via SUBCUTANEOUS
  Administered 2021-10-14 – 2021-10-15 (×8): 3 [IU] via SUBCUTANEOUS
  Administered 2021-10-16 (×2): 4 [IU] via SUBCUTANEOUS
  Administered 2021-10-16: 7 [IU] via SUBCUTANEOUS
  Administered 2021-10-16 – 2021-10-17 (×3): 3 [IU] via SUBCUTANEOUS
  Administered 2021-10-17: 4 [IU] via SUBCUTANEOUS
  Administered 2021-10-17: 3 [IU] via SUBCUTANEOUS

## 2021-10-11 MED ORDER — LACOSAMIDE 50 MG PO TABS
75.0000 mg | ORAL_TABLET | Freq: Two times a day (BID) | ORAL | Status: DC
Start: 2021-10-11 — End: 2021-10-20
  Administered 2021-10-11 – 2021-10-20 (×18): 75 mg
  Filled 2021-10-11 (×18): qty 2

## 2021-10-11 MED ORDER — INSULIN DETEMIR 100 UNIT/ML ~~LOC~~ SOLN
30.0000 [IU] | Freq: Two times a day (BID) | SUBCUTANEOUS | Status: DC
Start: 2021-10-11 — End: 2021-10-14
  Administered 2021-10-11 – 2021-10-13 (×5): 30 [IU] via SUBCUTANEOUS
  Filled 2021-10-11 (×7): qty 0.3

## 2021-10-11 MED ORDER — LEVETIRACETAM 500 MG PO TABS: 500.0000 mg | ORAL_TABLET | Freq: Two times a day (BID) | ORAL | Status: DC

## 2021-10-11 NOTE — Therapy (Signed)
[facility] ?[address] ?[place], [place], [address] ?Phone: [phone]   Fax:  [phone] ? ?October 11, 2021  ? ?No Recipients ? ?Physical Therapy Discharge Summary ? ?Patient: Melanie Hall  ?MRN: [id number]  ?Date of Birth: 10-11-1970  ? ?Diagnosis: No diagnosis found. ?Referring Provider (PT): Meridee Score MD ? ? ?The above patient had been seen in Physical Therapy 11 times, however has currently had a change in medical status and is in hospital.  ?Due to this change in status, it is best at this time to discharge from current episode of care at our clinic.  ? ?Sincerely, ? ? ? ?12:15 PM, 10/11/21 ? ?Margarette Asal Carlis Abbott, PT, DPT  ?Contract Physical Therapist at  ?[facility] ?[phone] ? ? ? ?CC ?No Recipients ? ?[facility] ?[address] ?[place], [place], [address] ?Phone: [phone]   Fax:  [phone] ? ?Patient: Melanie Hall  ?MRN: [id number]  ?Date of Birth: 23-Feb-1971  ? ? ? ?

## 2021-10-11 NOTE — Progress Notes (Signed)
Admit: 09/08/2021 ?LOS: 7 ? ?3F ESRD found unresponsive at home, mild hyperkalemia, + PRES ? ?Subjective: no new issues  ? ?04/04 0701 - 04/05 0700 ?In: 2035.6 [I.V.:219.6; JG/GE:3662; IV Piggyback:330.9] ?Out: 2280 [Urine:80; Stool:200] ? ?Filed Weights  ? 10/10/21 0900 10/10/21 1315 10/11/21 0500  ?Weight: 83.3 kg 81.2 kg 81.3 kg  ? ? ?Scheduled Meds: ? aspirin  81 mg Per Tube Daily  ? atorvastatin  80 mg Per Tube q1800  ? carvedilol  25 mg Per Tube BID WC  ? chlorhexidine gluconate (MEDLINE KIT)  15 mL Mouth Rinse BID  ? Chlorhexidine Gluconate Cloth  6 each Topical Q0600  ? ezetimibe  10 mg Per Tube Daily  ? feeding supplement (PROSource TF)  45 mL Per Tube QID  ? fiber  1 packet Per Tube TID  ? folic acid  1 mg Per Tube Daily  ? heparin injection (subcutaneous)  5,000 Units Subcutaneous Q8H  ? hydrALAZINE  10 mg Per Tube BID  ? insulin aspart  0-20 Units Subcutaneous Q4H  ? insulin aspart  12 Units Subcutaneous Q4H  ? insulin detemir  26 Units Subcutaneous Q12H  ? leptospermum manuka honey  1 application. Topical UD  ? levothyroxine  50 mcg Per Tube QAC breakfast  ? mouth rinse  15 mL Mouth Rinse 10 times per day  ? multivitamin  1 tablet Per Tube QHS  ? pantoprazole sodium  40 mg Per Tube Daily  ? [START ON 10/12/2021] thiamine injection  100 mg Intravenous Daily  ? ?Continuous Infusions: ? sodium chloride 10 mL/hr at 10/11/21 1000  ? sodium chloride    ? sodium chloride    ? feeding supplement (VITAL 1.5 CAL) 1,000 mL (10/11/21 0038)  ? lacosamide (VIMPAT) IV 75 mg (10/11/21 1012)  ? levETIRAcetam Stopped (10/10/21 2319)  ? thiamine injection Stopped (10/10/21 1248)  ? ?PRN Meds:.sodium chloride, sodium chloride, acetaminophen, docusate, fentaNYL (SUBLIMAZE) injection, heparin, heparin, hydrALAZINE, loperamide HCl, LORazepam, midazolam, polyethylene glycol ? ?Current Labs: reviewed  ? ? ?Physical Exam:  Blood pressure (!) 105/51, pulse 76, temperature 98.8 ?F (37.1 ?C), temperature source Oral, resp. rate (!)  21, height _0  (1.6 m), weight 81.3 kg, last menstrual period 12/17/2016, SpO2 94 %. ?GEN: Intubated ?ENT: ET tube in oropharynx ?EYES: Eyes closed ?CV: Regular, normal S1 and S2 ?PULM: Coarse breath sounds bilaterally ?ABD: Soft ?SKIN: No rashes or lesions ?EXT: no sig edema  ?VASCULAR: LUA AVF +bruit ? ? ? OP HD: TTS [facility] ?  LUE AVF ? - 3/30 here > hep B Ag negative and hep B Ab's low/ not protective ? ?CXR 3/30 - no active disease ? ?Assessment/ Plan:  ?PRES: Found unresponsive at home / AMS, neuro following, not improving; will be long process ?VDRF- for airway protection ?ESRD - on TTS [facility]. HD tomorrow.  ?Volume - no vol overload, lower UFG to 1 L.  ?DM2 - per pmd ?Possible sepsis - started on Ceftriaxone; BCx x2 NGTD ?Anemia - Hb 9s stable CTM ?CKD-BMM: Ca stable, trend P ?Nutrition: getting TF"s at 50 cc/hr ? ? ?Kelly Splinter, MD ?10/11/2021, 10:50 AM ? ?Recent Labs  ?Lab 10/06/21 ?1909 10/07/21 ?9476 10/08/21 ?0225 10/10/21 ?5465 10/11/21 ?0354  ?HGB  --  9.3*   < > 9.0* 9.0*  ?CALCIUM  --  7.4*   < > 8.5* 8.5*  ?PHOS 5.7* 5.9*  --   --   --   ?CREATININE  --  5.47*   < > 6.41* 3.92*  ?  K 4.3 4.2   < > 4.5 4.6  ? < > = values in this interval not displayed.  ? ? ? ? ? ? ? ?  ?

## 2021-10-11 NOTE — Plan of Care (Signed)
?  Interdisciplinary Goals of Care Family Meeting ? ? ?Date carried out:: 10/11/2021 ? ?Location of the meeting: Conference room ? ?Member's involved: Bedside Registered Nurse, Family Member or next of kin, and Other: PA ? ?Durable Power of Attorney or acting medical decision maker: Gaylan Gerold (daughter)  ? ?Discussion: We discussed goals of care for [employer] .  Spoke with family (3 daughters with husbands) to discuss [place] on their mother Yolande. One daughter phoned in for family conversation. Updated family on Sadie medical condition. Stated that her neuro status is not much improved and we are concerned that she may not recover. I let them know I spoke with Neurology MD, and they are recommending giving several more days (possibly by Monday) before they can conclude that there may not be any improvement neurologically. Also, patient has been on ventilator since 3/29 (1 week). Started discussing to family that next step would be trach since we are unable to extubate her with her poor mental status. Expressed that some of the only LTACH facilities with availability end up being out of state. Family asked about other options. Educated family on comfort care and how that would look. Family currently wants to talk about options and think about our discussion. Plan is to continue everything, full code. Will need to readdress early next week about whether trach vs. Comfort care.  ? ?Code status: Full Code ? ?Disposition: Continue current acute care ? ? ?Time spent for the meeting: 40 minutes ? ?Mick Sell ?10/11/2021, 6:21 PM ? ?

## 2021-10-11 NOTE — Progress Notes (Signed)
Inpatient Diabetes Program Recommendations ? ?AACE/ADA: New Consensus Statement on Inpatient Glycemic Control (2015) ? ?Target Ranges:  Prepandial:   less than 140 mg/dL ?     Peak postprandial:   less than 180 mg/dL (1-2 hours) ?     Critically ill patients:  140 - 180 mg/dL  ? ?Lab Results  ?Component Value Date  ? GLUCAP 205 (H) 10/11/2021  ? HGBA1C 10.5 (H) 09/20/2021  ? ? ?Review of Glycemic Control ? Latest Reference Range & Units 10/10/21 19:14 10/10/21 23:27 10/11/21 03:41 10/11/21 07:54 10/11/21 11:58  ?Glucose-Capillary 70 - 99 mg/dL 243 (H) 273 (H) 246 (H) 244 (H) 205 (H)  ?(H): Data is abnormally high ?Diabetes history: Type 2 DM ?Current orders for Inpatient glycemic control: Novolog 0-20 units Q4H, Novolog 12 units Q4H, Levemir 26 units BID ? ?Inpatient Diabetes Program Recommendations:   ? ?Consider increasing Levemir to 30 units BID.  ? ?Thanks, ?Bronson Curb, MSN, RNC-OB ?Diabetes Coordinator ?[phone] (8a-5p) ? ? ?

## 2021-10-11 NOTE — Progress Notes (Addendum)
Subjective: Low-grade fever with Tmax of 100.6 overnight ? ?ROS: Unable to obtain due to poor mental status ? ?Examination ? ?Vital signs in last 24 hours: ?Temp:  [98.3 ?F (36.8 ?C)-100.6 ?F (38.1 ?C)] 98.8 ?F (37.1 ?C) (04/05 0756) ?Pulse Rate:  [75-101] 76 (04/05 1000) ?Resp:  [18-27] 21 (04/05 1000) ?BP: (93-177)/(51-85) 105/51 (04/05 1000) ?SpO2:  [94 %-100 %] 94 % (04/05 1000) ?FiO2 (%):  [40 %] 40 % (04/05 0810) ?Weight:  [81.2 kg-81.3 kg] 81.3 kg (04/05 0500) ? ?General: lying in bed, NAD ?Neuro: winces to noxious stimulation but doesn't open eyes or follow commands, pupils sluggishly reacting to light, no forced gaze deviation, withdraws to noxious stimuli in all extremities  ? ?Basic Metabolic Panel: ?Recent Labs  ?Lab 10/05/21 ?1544 10/06/21 ?0154 10/06/21 ?0658 10/06/21 ?1909 10/07/21 ?6384 10/08/21 ?0225 10/10/21 ?6659 10/11/21 ?9357  ?NA  --  135  --   --  136 139 141 135  ?K 5.2* 3.7   < > 4.3 4.2 3.7 4.5 4.6  ?CL  --  95*  --   --  98 97* 103 99  ?CO2  --  26  --   --  '23 26 22 26  '$ ?GLUCOSE  --  185*  --   --  239* 325* 156* 251*  ?BUN  --  22*  --   --  47* 30* 70* 39*  ?CREATININE  --  4.00*  --   --  5.47* 3.74* 6.41* 3.92*  ?CALCIUM  --  7.8*  --   --  7.4* 8.1* 8.5* 8.5*  ?MG 1.7 1.7  --  1.6* 1.8  --  2.1  --   ?PHOS 6.7* 4.7*  --  5.7* 5.9*  --   --   --   ? < > = values in this interval not displayed.  ? ? ?CBC: ?Recent Labs  ?Lab 10/06/21 ?0658 10/07/21 ?0177 10/08/21 ?0225 10/10/21 ?9390 10/11/21 ?3009  ?WBC 9.7 9.2 8.4 10.9* 10.3  ?HGB 9.3* 9.3* 9.0* 9.0* 9.0*  ?HCT 28.8* 28.0* 27.5* 27.6* 27.7*  ?MCV 104.7* 105.7* 105.4* 105.7* 104.9*  ?PLT 207 184 187 170 152  ? ? ? ?Coagulation Studies: ?No results for input(s): LABPROT, INR in the last 72 hours. ? ?Imaging ?No new brain imaging overnight ?  ?ASSESSMENT AND PLAN: 51 year old female with seizures and altered mental status in the setting of systolic blood pressure 233 which has since improved. ?  ?Seizure ?Posterior reversible  encephalopathy syndrome (PRES) ?Acute encephalopathy due to PRES, seizures ?-No definite seizures overnight. Provoked seizures due to PRES ?- Encephalopathy appears to be out of proportion to imaging findings, drug screen still pending ?  ?Recommendations ?-Reduce Keppra to 500 mg twice daily to minimize sedation ?-Continue vimpat 75 mg BID ?-will obtain UDS as previous drug screen still pending ?-Continue seizure precautions ?-As needed IV Ativan 2 mg for clinical seizure-like activity ?-Management of rest of comorbidities per primary team ?  ?I have spent a total of 35 minutes with the patient reviewing hospital notes,  test results, labs and examining the patient as well as establishing an assessment and plan. > 50% of time was spent in direct patient care. ?  ? ?Zeb Comfort ?Epilepsy ?[facility] ?For questions after 5pm please refer to AMION to reach the Neurologist on call ? ?

## 2021-10-11 NOTE — Progress Notes (Addendum)
? ?NAME:  Melanie Hall, MRN:  [id number], DOB:  10/30/1970, LOS: 7 ?ADMISSION DATE:  09/13/2021, CONSULTATION DATE:  09/10/2021 ?REFERRING MD:  Dr. Roderic Hall, ER, CHIEF COMPLAINT:  AMS  ? ?History of Present Illness:  ?51 yo female former smoker with hx of ESRD on HD was found at her home by patient transport driver unresponsive.  Pt's neighbor noted that she was tapping her hands for about a minute, and then stopped.  She has snoring and drooling.  No improvement after narcan.  CBG normal.  Noted to have BP 215/96.  Intubated in [facility] for airway protection, given keppra load, started on diprivan, and started on antibiotics.  CT head negative for acute findings.  Treated for hyperkalemia.  Pt's daughter report she had similar episode on 09/24/21 and EMS was called to the house, but episode resolve and patient declined hospital evaluation.  No prior history of seizures or known head injury.  ER team d/w case with neurology and advised to transfer to [facility] for EEG monitoring and further neurology assessment. ? ?Hx from medical team, patients daughter at bedside, and medical records. ? ?Pertinent  Medical History  ?CHF, Anemia, OA, CAD, DM type 2, ESRD on HD, HLD, HTN, CVA ? ?Significant Hospital Events: ?Including procedures, antibiotic start and stop dates in addition to other pertinent events   ?3/29 present to [facility], intubated, transfer to [facility] ?3/30 MRI Brain consistent with PRES ?4/3 off sedation, not waking up ?4/4 transitioned off insulin gtt to SSI ? ?Interim History / Subjective:  ? ?Remains intubated on mech vent ?Mental status remains the same; mvt and opens eyes to painful stimuli; pupils sluggish to light w/ wandering gaze ? ?Objective   ?Blood pressure 126/65, pulse 82, temperature 99.8 ?F (37.7 ?C), temperature source Oral, resp. rate 19, height '5\' 3"'$  (1.6 m), weight 81.3 kg, last menstrual period 12/17/2016, SpO2 100 %. ?   ?Vent Mode: PRVC ?FiO2 (%):  [40 %] 40 % ?Set Rate:  [18 bmp] 18 bmp ?Vt Set:  [420 mL] 420  mL ?PEEP:  [5 cmH20] 5 cmH20 ?Pressure Support:  [5 cmH20] 5 cmH20 ?Plateau Pressure:  [13 cmH20-22 cmH20] 15 cmH20  ? ?Intake/Output Summary (Last 24 hours) at 10/11/2021 0756 ?Last data filed at 10/11/2021 0700 ?Gross per 24 hour  ?Intake 2035.56 ml  ?Output 2280 ml  ?Net -244.44 ml  ? ? ?Filed Weights  ? 10/10/21 0900 10/10/21 1315 10/11/21 0500  ?Weight: 83.3 kg 81.2 kg 81.3 kg  ? ?Physical Exam: ?General:  critically ill appearing on mech vent ?HEENT: MM pink/moist; ETT in place ?Neuro: pupils 4 mm bilaterally sluggish to light; mvt and opens eyes to painful stimuli; wandering gaze ?CV: s1s2, no m/r/g ?PULM:  dim clear BS bilaterally; on mech vent PRVC ?GI: soft, bsx4 active  ?Extremities: warm/dry, no edema  ?Skin: no rashes or lesions  ? ?Resolved Hospital Problem list   ? ? ?Assessment & Plan:  ?Posterior reversible encephalopathy syndrome with associated seizures ?Acute metabolic encephalopathy 2/2 PRES ?MRI brain consistent with PRES. No seizures seen on spot EEG but showed cortical dysfunction from the left hemisphere.  CT head did not show any acute intracranial hemorrhage/mass/lesion.  No dural sinus thrombus. There are mild stenosis of the left ICA but should not cause her issue. CVA should not cause this extent of encephalopathy and a large stroke should appear on CT. ?P: ?-neuro following; appreciate recs ?-cont keppra, vimpat ?-prn ativan for seizures ?-seizure precautions in place ?-cont thiamine, folic acid, mvi ?-  limit sedating meds for frequent neuro checks ? ?Acute respiratory failure secondary to probable aspiration PNA ?-completed rocephin x 5 days 4/2 ?P: ?-cont mech vent PRVC 4-8 cc/kg ?-wean fio2 for sats >92% ?-VAP prevention in place ?-Daily SBT/SAT when able; not able to extubate due to mental status ? ?ESRD on iHD. ?Hyperkalemia -resolved ?P: ?-nephro following ?-continue iHD ?-Trend BMP / urinary output ?-Replace electrolytes as indicated ?-Avoid nephrotoxic agents, ensure adequate renal  perfusion ? ?Hx of HTN, chronic HFrEF, CAD, HLD. ?P: ?-cont carvedilol and hydralazine ?-cont ASA, statin, zetia ?-daily weights; strict I/o's ? ?DM type 2 poorly controlled with hyperglycemia. ?P: ?-glucose range last 24 hours 134-273 ?-TF coverage increased to 8 units overnight; will increase to 12 today ?-SSI and cbg monitoring ?-cont basal insulin ? ?Hx of hypothyroidism.. TSH WNL ?P: ?-cont home synthroid ? ?Stage 1 sacral pressure injury and deep tissue pressure injury right Toe, present on admission  ?P: ?-cont wound care ? ? ?Best Practice (right click and "Reselect all SmartList Selections" daily)  ? ?Diet/type: tubefeeds ?DVT prophylaxis: prophylactic heparin  ?GI prophylaxis: PPI ?Lines: N/A ?Foley:  N/A ?Code Status:  full code ?Last date of multidisciplinary goals of care discussion [3/31] Updated daughter, Melanie Hall, on patient's critical condition. Remains full code. Ongoing goals of care discussions with family.  At this point looks like this would be a prolonged recovery with unknown residual deficits.  Anticipate that she would likely not be able to live independently in the community in the future without significant assistance.  ?20/5 called daughter Melanie Hall over phone to give update and she states that she won't be able to come in today for family conversation, but she should be available tomorrow hopefully before 10 am to come in to have Melanie Hall conversation. She said she will try to bring her husband with her.  ? ?Critical Care time: 35 minutes ? ?Mikki Harbor, PA-C ?[facility] ?10/11/2021, 7:56 AM ? ?Please see [email or web address] for pager details. ? ?From 7A-7P if no response, please call [phone]. ?After hours, please call ELink [phone]. ? ? ?

## 2021-10-11 NOTE — Telephone Encounter (Signed)
DPT called and LVM for main cell number listed as well as first contact of daughter [name].  LVM as she was scheduled for outpatient PT today and had a few more scheduled.  Upcoming outpatient visits at our location will also be canceled. Due to change in status and other medical needs at this time as seen in chart review, patient will be discharged from current episode of care at our clinic.  ? ? ?11:55 AM, 10/11/21 ? ?Margarette Asal Carlis Abbott, PT, DPT  ?Contract Physical Therapist at  ?[facility] ?[phone] ? ?

## 2021-10-11 NOTE — Progress Notes (Signed)
Nutrition Follow-up ? ?DOCUMENTATION CODES:  ? ?Not applicable ? ?INTERVENTION:  ? ?Continue tube feeding via OG tube: ?- Vital 1.5 @ 50 ml/hr (1200 ml/day) ?- ProSource TF 45 ml QID ? ?Tube feeding regimen provides 1960 kcal, 125 grams of protein, and 917 ml of H2O. ?  ?- Continue renal MVI daily per tube ? ?NUTRITION DIAGNOSIS:  ? ?Inadequate oral intake related to inability to eat as evidenced by NPO status. ? ?Ongoing, being addressed via tube feeds ? ?GOAL:  ? ?Patient will meet greater than or equal to 90% of their needs ? ?Met via tube feeds ? ?MONITOR:  ? ?Vent status, Labs, Weight trends, TF tolerance, Skin, I & O's ? ?REASON FOR ASSESSMENT:  ? ?Ventilator, Consult ?Enteral/tube feeding initiation and management ? ?ASSESSMENT:  ? ?51 year old female who presented to the ED on 3/29 with seizure-like activity. PMH of ESRD on HD, CHF, anemia, CAD, T2DM, HLD, HTN, CVA, GERD. Pt required intubation for airway protection. Pt admitted with acute metabolic encephalopathy, probable aspiration PNA. ? ?03/30 - tube feeding protocol started ? ?Discussed pt with RN and during ICU rounds. Last iHD was on 4/04 with 2000 ml net UF. Pt tolerating tube feeds via OG tube without issue. ? ?Current TF: Vital 1.5 @ 50 ml/hr, ProSource TF 45 ml QID ? ?EDW: 75.5 kg ?Admit weight: 83.9 kg ?Current weight: 81.3 kg ? ?Pt with mild pitting generalized edema and moderate pitting edema to BUE. ? ?Patient is currently intubated on ventilator support ?MV: 9.3 L/min ?Temp (24hrs), Avg:99.7 ?F (37.6 ?C), Min:98.3 ?F (36.8 ?C), Max:100.6 ?F (38.1 ?C) ? ?Drips: ?NS: 10 ml/hr ? ?Medications reviewed and include: nutrisource fiber TID, folic acid, SSI q 4 hours, novolog 12 units q 4 hours, levemir 26 units BID, rena-vit, protonix, thiamine ? ?Labs reviewed: hemoglobin 9.0 ?CBG's: 198-273 x 24 hours ? ?UOP: 80 ml x 24 hours ?Rectal tube: 200 ml x 24 hours ?I/O's: +6.7 L since admit ? ?Diet Order:   ?Diet Order   ? ?       ?  Diet NPO time  specified  Diet effective now       ?  ? ?  ?  ? ?  ? ? ?EDUCATION NEEDS:  ? ?Not appropriate for education at this time ? ?Skin:  Skin Assessment: ?Skin Integrity Issues: ?DTI: R foot toe ?Stage I: sacrum ?Other: MASD pelvis ? ?Last BM:  10/10/21 rectal tube ? ?Height:  ? ?Ht Readings from Last 1 Encounters:  ?09/16/2021 '5\' 3"'  (1.6 m)  ? ? ?Weight:  ? ?Wt Readings from Last 1 Encounters:  ?10/11/21 81.3 kg  ? ? ?Ideal Body Weight:  52.3 kg ? ?BMI:  Body mass index is 31.75 kg/m?. ? ?Estimated Nutritional Needs:  ? ?Kcal:  1950-2150 ? ?Protein:  115-135 grams ? ?Fluid:  1000 ml + UOP ? ? ? ?Gustavus Bryant, MS, RD, LDN ?Inpatient Clinical Dietitian ?Please see AMiON for contact information. ? ?

## 2021-10-12 DIAGNOSIS — Z9911 Dependence on respirator [ventilator] status: Secondary | ICD-10-CM | POA: Diagnosis not present

## 2021-10-12 DIAGNOSIS — R4182 Altered mental status, unspecified: Secondary | ICD-10-CM | POA: Diagnosis not present

## 2021-10-12 DIAGNOSIS — G9340 Encephalopathy, unspecified: Secondary | ICD-10-CM | POA: Diagnosis not present

## 2021-10-12 DIAGNOSIS — J9601 Acute respiratory failure with hypoxia: Secondary | ICD-10-CM | POA: Diagnosis not present

## 2021-10-12 LAB — CBC
HCT: 25.5 % — ABNORMAL LOW (ref 36.0–46.0)
Hemoglobin: 8.3 g/dL — ABNORMAL LOW (ref 12.0–15.0)
MCH: 34.3 pg — ABNORMAL HIGH (ref 26.0–34.0)
MCHC: 32.5 g/dL (ref 30.0–36.0)
MCV: 105.4 fL — ABNORMAL HIGH (ref 80.0–100.0)
Platelets: 155 10*3/uL (ref 150–400)
RBC: 2.42 MIL/uL — ABNORMAL LOW (ref 3.87–5.11)
RDW: 16.2 % — ABNORMAL HIGH (ref 11.5–15.5)
WBC: 9.9 10*3/uL (ref 4.0–10.5)
nRBC: 0 % (ref 0.0–0.2)

## 2021-10-12 LAB — GLUCOSE, CAPILLARY
Glucose-Capillary: 121 mg/dL — ABNORMAL HIGH (ref 70–99)
Glucose-Capillary: 130 mg/dL — ABNORMAL HIGH (ref 70–99)
Glucose-Capillary: 147 mg/dL — ABNORMAL HIGH (ref 70–99)
Glucose-Capillary: 115 mg/dL — ABNORMAL HIGH (ref 70–99)
Glucose-Capillary: 156 mg/dL — ABNORMAL HIGH (ref 70–99)
Glucose-Capillary: 144 mg/dL — ABNORMAL HIGH (ref 70–99)

## 2021-10-12 LAB — CULTURE, RESPIRATORY W GRAM STAIN
Culture: NO GROWTH
Gram Stain: NONE SEEN

## 2021-10-12 LAB — BASIC METABOLIC PANEL
Anion gap: 13 (ref 5–15)
BUN: 66 mg/dL — ABNORMAL HIGH (ref 6–20)
CO2: 23 mmol/L (ref 22–32)
Calcium: 8.6 mg/dL — ABNORMAL LOW (ref 8.9–10.3)
Chloride: 101 mmol/L (ref 98–111)
Creatinine, Ser: 5.32 mg/dL — ABNORMAL HIGH (ref 0.44–1.00)
GFR, Estimated: 9 mL/min — ABNORMAL LOW (ref >60)
Glucose, Bld: 120 mg/dL — ABNORMAL HIGH (ref 70–99)
Potassium: 5 mmol/L (ref 3.5–5.1)
Sodium: 137 mmol/L (ref 135–145)

## 2021-10-12 MED ORDER — THIAMINE HCL 100 MG PO TABS
100.0000 mg | ORAL_TABLET | Freq: Every day | ORAL | Status: DC
  Administered 2021-10-13 – 2021-11-01 (×20): 100 mg
  Filled 2021-10-12 (×20): qty 1

## 2021-10-12 NOTE — Progress Notes (Addendum)
? ?NAME:  Melanie Hall, MRN:  [id number], DOB:  1970-10-17, LOS: 8 ?ADMISSION DATE:  09/28/2021, CONSULTATION DATE:  10/03/2021 ?REFERRING MD:  Dr. Roderic Palau, ER, CHIEF COMPLAINT:  AMS  ? ?History of Present Illness:  ?51 yo female former smoker with hx of ESRD on HD was found at her home by patient transport driver unresponsive.  Pt's neighbor noted that she was tapping her hands for about a minute, and then stopped.  She has snoring and drooling.  No improvement after narcan.  CBG normal.  Noted to have BP 215/96.  Intubated in [facility] for airway protection, given keppra load, started on diprivan, and started on antibiotics.  CT head negative for acute findings.  Treated for hyperkalemia.  Pt's daughter report she had similar episode on 09/24/21 and EMS was called to the house, but episode resolve and patient declined hospital evaluation.  No prior history of seizures or known head injury.  ER team d/w case with neurology and advised to transfer to [facility] for EEG monitoring and further neurology assessment. ? ?Hx from medical team, patients daughter at bedside, and medical records. ? ?Pertinent  Medical History  ?CHF, Anemia, OA, CAD, DM type 2, ESRD on HD, HLD, HTN, CVA ? ?Significant Hospital Events: ?Including procedures, antibiotic start and stop dates in addition to other pertinent events   ?3/29 present to [facility], intubated, transfer to [facility] ?3/30 MRI Brain consistent with PRES ?4/3 off sedation, not waking up ?4/4 transitioned off insulin gtt to SSI ?4/5 [place] conversation; see IPAL note ? ?Interim History / Subjective:  ? ?Remains intubated on mech vent ?Mental status remains the same; winces to painful stimuli; pupils sluggish to light w/ wandering gaze ?Tmax 100.1 F; WBC trending down now 9.9 ? ?Objective   ?Blood pressure 136/71, pulse 78, temperature 100.1 ?F (37.8 ?C), temperature source Axillary, resp. rate 19, height '5\' 3"'$  (1.6 m), weight 81.3 kg, last menstrual period 12/17/2016, SpO2 100 %. ?   ?Vent Mode:  PRVC ?FiO2 (%):  [40 %] 40 % ?Set Rate:  [18 bmp] 18 bmp ?Vt Set:  [420 mL] 420 mL ?PEEP:  [5 cmH20] 5 cmH20 ?Pressure Support:  [5 cmH20] 5 cmH20 ?Plateau Pressure:  [11 PYP95-09 cmH20] 14 cmH20  ? ?Intake/Output Summary (Last 24 hours) at 10/12/2021 0715 ?Last data filed at 10/12/2021 0600 ?Gross per 24 hour  ?Intake 1292.66 ml  ?Output 300 ml  ?Net 992.66 ml  ? ? ?Filed Weights  ? 10/10/21 1315 10/11/21 0500 10/12/21 0500  ?Weight: 81.2 kg 81.3 kg 81.3 kg  ? ?Physical Exam: ?General:  critically ill appearing on mech vent ?HEENT: MM pink/moist; ETT in place ?Neuro: pupils 3 mm bilaterally sluggish to light; winces eyes to painful stimuli; wandering gaze ?CV: s1s2, no m/r/g ?PULM:  dim clear BS bilaterally; on mech vent PRVC ?GI: soft, bsx4 active  ?Extremities: warm/dry, no edema  ?Skin: no rashes or lesions appreciated ? ?Resolved Hospital Problem list   ? ? ?Assessment & Plan:  ?Posterior reversible encephalopathy syndrome with associated seizures ?Acute metabolic encephalopathy 2/2 PRES ?MRI brain consistent with PRES. No seizures seen on spot EEG but showed cortical dysfunction from the left hemisphere.  CT head did not show any acute intracranial hemorrhage/mass/lesion.  No dural sinus thrombus. There are mild stenosis of the left ICA but should not cause her issue. CVA should not cause this extent of encephalopathy and a large stroke should appear on CT. ?P: ?-neuro following; appreciate recs ?-neuro weaning keppra, continue vimpat ?-prn ativan  for seizures ?-seizure precautions in place ?-cont thiamine, folic acid, mvi ?-limit sedating meds for frequent neuro checks ? ?Acute respiratory failure secondary to probable aspiration PNA ?-completed rocephin x 5 days 4/2 ?P: ?-cont mech vent PRVC 4-8 cc/kg ?-wean fio2 for sats >92% ?-VAP prevention in place ?-Daily SBT/SAT when able; not able to extubate due to mental status ? ?ESRD on iHD. ?Hyperkalemia -resolved ?P: ?-nephro following ?-continue iHD ?-Trend BMP /  urinary output ?-Replace electrolytes as indicated ?-Avoid nephrotoxic agents, ensure adequate renal perfusion ? ?Hx of HTN, chronic HFrEF, CAD, HLD. ?P: ?-cont carvedilol and hydralazine ?-cont ASA, statin, zetia ?-daily weights; strict I/o's ? ?DM type 2 poorly controlled with hyperglycemia. ?P: ?-cont TF coverage and basal insulin ?-SSI and cbg monitoring ? ?Hx of hypothyroidism.. TSH WNL ?P: ?-cont home synthroid ? ?Stage 1 sacral pressure injury and deep tissue pressure injury right Toe, present on admission  ?P: ?-cont wound care ? ? ?Best Practice (right click and "Reselect all SmartList Selections" daily)  ? ?Diet/type: tubefeeds ?DVT prophylaxis: prophylactic heparin  ?GI prophylaxis: PPI ?Lines: N/A ?Foley:  N/A ?Code Status:  full code ?Last date of multidisciplinary goals of care discussion [3/31] Updated daughter, [name], on patient's critical condition. Remains full code. Ongoing goals of care discussions with family.  At this point looks like this would be a prolonged recovery with unknown residual deficits.  Anticipate that she would likely not be able to live independently in the community in the future without significant assistance.  ?4/5 see separate IPAL note; continue current care; full code. Family wants a few more days to see if she will wake up first. Will readdress trach vs. Comfort care early next week. ?4/6 called daughter [name] and gave update over phone ? ?Critical Care time: 35 minutes ? ?Mikki Harbor, PA-C ?[facility] ?10/12/2021, 7:15 AM ? ?Please see [email or web address] for pager details. ? ?From 7A-7P if no response, please call [phone]. ?After hours, please call ELink [phone]. ? ? ?

## 2021-10-12 NOTE — Progress Notes (Signed)
Pt placed in PSV by RT. Pt tolerating well at this time, RN aware, RT will continue to monitor.  ?

## 2021-10-12 NOTE — Progress Notes (Signed)
Admit: 09/27/2021 ?LOS: 8 ? ? ?Subjective: no new issues  ? ?04/05 0701 - 04/06 0700 ?In: 1352.7 [I.V.:202.7; NG/GT:1100; IV Piggyback:50] ?Out: 300 [Stool:300] ? ?Filed Weights  ? 10/10/21 1315 10/11/21 0500 10/12/21 0500  ?Weight: 81.2 kg 81.3 kg 81.3 kg  ? ? ?Scheduled Meds: ? aspirin  81 mg Per Tube Daily  ? atorvastatin  80 mg Per Tube q1800  ? carvedilol  25 mg Per Tube BID WC  ? chlorhexidine gluconate (MEDLINE KIT)  15 mL Mouth Rinse BID  ? Chlorhexidine Gluconate Cloth  6 each Topical Q0600  ? ezetimibe  10 mg Per Tube Daily  ? feeding supplement (PROSource TF)  45 mL Per Tube QID  ? fiber  1 packet Per Tube TID  ? folic acid  1 mg Per Tube Daily  ? heparin injection (subcutaneous)  5,000 Units Subcutaneous Q8H  ? hydrALAZINE  10 mg Per Tube BID  ? insulin aspart  0-20 Units Subcutaneous Q4H  ? insulin aspart  12 Units Subcutaneous Q4H  ? insulin detemir  30 Units Subcutaneous Q12H  ? lacosamide  75 mg Per Tube BID  ? leptospermum manuka honey  1 application. Topical UD  ? levETIRAcetam  500 mg Per Tube BID  ? levothyroxine  50 mcg Per Tube QAC breakfast  ? mouth rinse  15 mL Mouth Rinse 10 times per day  ? multivitamin  1 tablet Per Tube QHS  ? pantoprazole sodium  40 mg Per Tube Daily  ? thiamine injection  100 mg Intravenous Daily  ? ?Continuous Infusions: ? sodium chloride 10 mL/hr at 10/12/21 0700  ? sodium chloride    ? sodium chloride    ? feeding supplement (VITAL 1.5 CAL) 1,000 mL (10/11/21 2124)  ? ?PRN Meds:.sodium chloride, sodium chloride, acetaminophen, docusate, fentaNYL (SUBLIMAZE) injection, heparin, heparin, hydrALAZINE, loperamide HCl, LORazepam, midazolam, polyethylene glycol ? ?Current Labs: reviewed  ? ? ?Physical Exam:  Blood pressure 128/61, pulse 78, temperature 99.8 ?F (37.7 ?C), temperature source Oral, resp. rate 17, height _0  (1.6 m), weight 81.3 kg, last menstrual period 12/17/2016, SpO2 100 %. ?GEN: Intubated ?ENT: ET tube in oropharynx ?EYES: Eyes closed ?CV: Regular,  normal S1 and S2 ?PULM: Coarse breath sounds bilaterally ?ABD: Soft ?SKIN: No rashes or lesions ?EXT: no sig edema  ?VASCULAR: LUA AVF +bruit ? ? ? OP HD: TTS [facility] ?  LUE AVF ? - 3/30 here > hep B Ag negative and hep B Ab's low/ not protective ? ?CXR 3/30 - no active disease ? ?Assessment/ Plan:  ?AMS - found unresponsive at home. Had PRES and seizures. MS remains poor. Neurology following.  ?VDRF- for airway protection ?ESRD - on TTS [facility]. HD today.  ?Volume - no vol overload, lower UFG to 1 L.  ?DM2 - per pmd ?Possible sepsis - sp IV abx for 4 days. BCx x2 NGTD ?Anemia - Hb 9s stable CTM ?CKD-BMM: Ca stable, trend P ?Nutrition: getting TF"s at 50 cc/hr ? ? ?Kelly Splinter, MD ?10/12/2021, 8:07 AM ? ?Recent Labs  ?Lab 10/06/21 ?1909 10/07/21 ?5366 10/08/21 ?0225 10/11/21 ?4403 10/12/21 ?4742  ?HGB  --  9.3*   < > 9.0* 8.3*  ?CALCIUM  --  7.4*   < > 8.5* 8.6*  ?PHOS 5.7* 5.9*  --   --   --   ?CREATININE  --  5.47*   < > 3.92* 5.32*  ?K 4.3 4.2   < > 4.6 5.0  ? < > = values in  this interval not displayed.  ? ? ? ? ? ? ? ? ?  ?

## 2021-10-13 ENCOUNTER — Encounter (HOSPITAL_COMMUNITY): Payer: Medicaid Other | Admitting: Physical Therapy

## 2021-10-13 ENCOUNTER — Inpatient Hospital Stay (HOSPITAL_COMMUNITY): Payer: Medicaid Other

## 2021-10-13 DIAGNOSIS — R4182 Altered mental status, unspecified: Secondary | ICD-10-CM | POA: Diagnosis not present

## 2021-10-13 DIAGNOSIS — J9601 Acute respiratory failure with hypoxia: Secondary | ICD-10-CM | POA: Diagnosis not present

## 2021-10-13 DIAGNOSIS — G9340 Encephalopathy, unspecified: Secondary | ICD-10-CM | POA: Diagnosis not present

## 2021-10-13 DIAGNOSIS — Z9911 Dependence on respirator [ventilator] status: Secondary | ICD-10-CM | POA: Diagnosis not present

## 2021-10-13 LAB — BASIC METABOLIC PANEL
Anion gap: 15 (ref 5–15)
BUN: 95 mg/dL — ABNORMAL HIGH (ref 6–20)
CO2: 21 mmol/L — ABNORMAL LOW (ref 22–32)
Calcium: 8.4 mg/dL — ABNORMAL LOW (ref 8.9–10.3)
Chloride: 100 mmol/L (ref 98–111)
Creatinine, Ser: 6.6 mg/dL — ABNORMAL HIGH (ref 0.44–1.00)
GFR, Estimated: 7 mL/min — ABNORMAL LOW (ref >60)
Glucose, Bld: 134 mg/dL — ABNORMAL HIGH (ref 70–99)
Potassium: 5.7 mmol/L — ABNORMAL HIGH (ref 3.5–5.1)
Sodium: 136 mmol/L (ref 135–145)

## 2021-10-13 LAB — HEPATIC FUNCTION PANEL
ALT: 38 U/L (ref 0–44)
AST: 51 U/L — ABNORMAL HIGH (ref 15–41)
Albumin: 2.6 g/dL — ABNORMAL LOW (ref 3.5–5.0)
Alkaline Phosphatase: 122 U/L (ref 38–126)
Bilirubin, Direct: 0.1 mg/dL (ref 0.0–0.2)
Indirect Bilirubin: 0.3 mg/dL (ref 0.3–0.9)
Total Bilirubin: 0.4 mg/dL (ref 0.3–1.2)
Total Protein: 6.1 g/dL — ABNORMAL LOW (ref 6.5–8.1)

## 2021-10-13 LAB — GLUCOSE, CAPILLARY
Glucose-Capillary: 102 mg/dL — ABNORMAL HIGH (ref 70–99)
Glucose-Capillary: 105 mg/dL — ABNORMAL HIGH (ref 70–99)
Glucose-Capillary: 109 mg/dL — ABNORMAL HIGH (ref 70–99)
Glucose-Capillary: 123 mg/dL — ABNORMAL HIGH (ref 70–99)
Glucose-Capillary: 137 mg/dL — ABNORMAL HIGH (ref 70–99)
Glucose-Capillary: 163 mg/dL — ABNORMAL HIGH (ref 70–99)

## 2021-10-13 LAB — CBC
HCT: 23.9 % — ABNORMAL LOW (ref 36.0–46.0)
Hemoglobin: 7.6 g/dL — ABNORMAL LOW (ref 12.0–15.0)
MCH: 33.2 pg (ref 26.0–34.0)
MCHC: 31.8 g/dL (ref 30.0–36.0)
MCV: 104.4 fL — ABNORMAL HIGH (ref 80.0–100.0)
Platelets: 145 10*3/uL — ABNORMAL LOW (ref 150–400)
RBC: 2.29 MIL/uL — ABNORMAL LOW (ref 3.87–5.11)
RDW: 16.1 % — ABNORMAL HIGH (ref 11.5–15.5)
WBC: 9.4 10*3/uL (ref 4.0–10.5)
nRBC: 0 % (ref 0.0–0.2)

## 2021-10-13 LAB — AMMONIA: Ammonia: 25 umol/L (ref 9–35)

## 2021-10-13 MED ORDER — DARBEPOETIN ALFA 150 MCG/0.3ML IJ SOSY
150.0000 ug | PREFILLED_SYRINGE | INTRAMUSCULAR | Status: DC
  Administered 2021-10-13: 150 ug via SUBCUTANEOUS
  Filled 2021-10-13: qty 0.3

## 2021-10-13 MED ORDER — MODAFINIL 100 MG PO TABS
100.0000 mg | ORAL_TABLET | Freq: Every day | ORAL | Status: DC
  Administered 2021-10-13 – 2021-10-20 (×8): 100 mg
  Filled 2021-10-13 (×9): qty 1

## 2021-10-13 MED ORDER — VITAMIN B-12 1000 MCG PO TABS
1000.0000 ug | ORAL_TABLET | Freq: Every day | ORAL | Status: DC
  Administered 2021-10-13 – 2021-11-01 (×19): 1000 ug
  Filled 2021-10-13 (×20): qty 1

## 2021-10-13 MED ORDER — LEVETIRACETAM 100 MG/ML PO SOLN
250.0000 mg | Freq: Two times a day (BID) | ORAL | Status: DC
  Administered 2021-10-13 – 2021-10-16 (×6): 250 mg
  Filled 2021-10-13 (×6): qty 5

## 2021-10-13 MED ORDER — HEPARIN SODIUM (PORCINE) 1000 UNIT/ML DIALYSIS
2000.0000 [IU] | INTRAMUSCULAR | Status: DC | PRN
  Administered 2021-10-18: 2000 [IU] via INTRAVENOUS_CENTRAL
  Filled 2021-10-13: qty 2

## 2021-10-13 MED ORDER — INSULIN ASPART 100 UNIT/ML IJ SOLN
8.0000 [IU] | INTRAMUSCULAR | Status: DC
  Administered 2021-10-13 – 2021-10-15 (×6): 8 [IU] via SUBCUTANEOUS

## 2021-10-13 MED ORDER — SODIUM CHLORIDE 0.9 % IV SOLN: 62.5000 mg | INTRAVENOUS | Status: DC

## 2021-10-13 MED ORDER — CALCITRIOL 0.25 MCG PO CAPS: 0.2500 ug | ORAL_CAPSULE | ORAL | Status: DC

## 2021-10-13 NOTE — Procedures (Signed)
Patient Name: Melanie Hall  ?MRN: [id number]  ?Epilepsy Attending: Lora Havens  ?Referring Physician/Provider: Lora Havens, MD ?Date: 10/13/2021 ?Duration: 24.25 mins ? ?Patient history:  51yo F with ams. EEG to evaluate for seizure ?  ?Level of alertness:  comatose ?  ?AEDs during EEG study: LEV, LCM ?  ?Technical aspects: This EEG study was done with scalp electrodes positioned according to the 10-20 International system of electrode placement. Electrical activity was acquired at a sampling rate of '500Hz'$  and reviewed with a high frequency filter of '70Hz'$  and a low frequency filter of '1Hz'$ . EEG data were recorded continuously and digitally stored.  ?  ?Description:  EEG showed continuous generalized 3-6 hz theta-delta slowing which at times appears sharply contoured with triphasic morphology at '2hz'$ .  Hyperventilation and photic stimulation were not performed.    ?  ?ABNORMALITY ?- Continuous slow, generalized ?  ?IMPRESSION: ?This study is suggestive of severe diffuse encephalopathy, nonspecific etiology but could be secondary to toxic-metabolic causes.  No seizures or definite epileptiform discharges were seen during the study. ?   ?Lora Havens  ?

## 2021-10-13 NOTE — Progress Notes (Signed)
Pt is unavailable for his EEG at this time. Pt is receiving Dialysis. Will attempt this afternoon when schedule permits ?

## 2021-10-13 NOTE — Progress Notes (Addendum)
Admit: 09/16/2021 ?LOS: 9 ? ? ?Subjective: no new issues, seen in ICU ? ?04/06 0701 - 04/07 0700 ?In: 2951 [I.V.:120; NG/GT:1435] ?Out: 375 [Urine:175; Stool:200] ? ?Filed Weights  ? 10/12/21 0500 10/13/21 0450 10/13/21 0915  ?Weight: 81.3 kg 81.7 kg 81.7 kg  ? ? ?Scheduled Meds: ? aspirin  81 mg Per Tube Daily  ? atorvastatin  80 mg Per Tube q1800  ? carvedilol  25 mg Per Tube BID WC  ? chlorhexidine gluconate (MEDLINE KIT)  15 mL Mouth Rinse BID  ? Chlorhexidine Gluconate Cloth  6 each Topical Q0600  ? ezetimibe  10 mg Per Tube Daily  ? feeding supplement (PROSource TF)  45 mL Per Tube QID  ? fiber  1 packet Per Tube TID  ? folic acid  1 mg Per Tube Daily  ? heparin injection (subcutaneous)  5,000 Units Subcutaneous Q8H  ? hydrALAZINE  10 mg Per Tube BID  ? insulin aspart  0-20 Units Subcutaneous Q4H  ? insulin aspart  12 Units Subcutaneous Q4H  ? insulin detemir  30 Units Subcutaneous Q12H  ? lacosamide  75 mg Per Tube BID  ? leptospermum manuka honey  1 application. Topical UD  ? levETIRAcetam  500 mg Per Tube BID  ? levothyroxine  50 mcg Per Tube QAC breakfast  ? mouth rinse  15 mL Mouth Rinse 10 times per day  ? multivitamin  1 tablet Per Tube QHS  ? pantoprazole sodium  40 mg Per Tube Daily  ? thiamine  100 mg Per Tube Daily  ? ?Continuous Infusions: ? sodium chloride 10 mL/hr at 10/12/21 1500  ? sodium chloride    ? feeding supplement (VITAL 1.5 CAL) 1,000 mL (10/11/21 2124)  ? ?PRN Meds:.sodium chloride, acetaminophen, docusate, fentaNYL (SUBLIMAZE) injection, heparin, hydrALAZINE, loperamide HCl, LORazepam, polyethylene glycol ? ?Current Labs: reviewed  ? ? ?Physical Exam:  Blood pressure 103/64, pulse 81, temperature 98.2 ?F (36.8 ?C), temperature source Axillary, resp. rate (!) 27, height '5\' 3"'  (1.6 m), weight 81.7 kg, last menstrual period 12/17/2016, SpO2 100 %. ?GEN: Intubated ?ENT: ET tube in oropharynx ?EYES: Eyes closed ?CV: Regular, normal S1 and S2 ?PULM: Coarse breath sounds bilaterally ?ABD:  Soft ?SKIN: No rashes or lesions ?EXT: no sig edema  ?VASCULAR: LUA AVF +bruit ? ? ? OP HD: TTS [facility] ?  3h 80mn  75.5kg  2/2.5 bath  400 bfr   Hep none LUE AVF ? - 3/30 here > hep B Ag negative and hep B Ab's low/ not protective ? - mircera 200 q2 wks ? - venofer 50 weekly ? - rocaltrol 0.25 tiw ? ?CXR 3/30 - no active disease ? ?Assessment/ Plan:  ?AMS - found unresponsive at home. Had PRES and seizures. MS remains poor. Neurology following.  ?VDRF- for airway protection ?ESRD - on TTS [facility]. Will get HD today off schedule and again tomorrow to get back on schedule.  ?Volume - no vol overload on exam but up several kg from OP dry wt. Will ^ UF w/ HD tomorrow.  ?DM2 - per pmd ?Possible sepsis - sp IV abx x 4 days. BCx x2 NGTD ?Anemia - Hb 8-9s, due for esa will start darbe 150 ug weekly SQ and weekly IV Fe.  ?CKD-BMM: Ca stable, trend P. Cont vdra tiw.  ?Nutrition: getting TF"s at 50 cc/hr ? ? ?RKelly Splinter MD ?10/13/2021, 10:34 AM ? ?Recent Labs  ?Lab 10/06/21 ?1909 10/07/21 ?0884104/02/23 ?0225 10/12/21 ?0660604/07/23 ?0130  ?HGB  --  9.3*   < > 8.3* 7.6*  ?ALBUMIN  --   --   --   --  2.6*  ?CALCIUM  --  7.4*   < > 8.6* 8.4*  ?PHOS 5.7* 5.9*  --   --   --   ?CREATININE  --  5.47*   < > 5.32* 6.60*  ?K 4.3 4.2   < > 5.0 5.7*  ? < > = values in this interval not displayed.  ? ? ? ? ? ? ? ? ?  ?

## 2021-10-13 NOTE — Progress Notes (Signed)
Subjective: continues to be comatose, grimacing to noxious stimuli only. Received '2mg'$  IV versed last night at 2355 for agitation ? ?ROS: Unable to obtain due to poor mental status ? ?Examination ? ?Vital signs in last 24 hours: ?Temp:  [97.9 ?F (36.6 ?C)-100 ?F (37.8 ?C)] 98.2 ?F (36.8 ?C) (04/07 0930) ?Pulse Rate:  [71-82] 79 (04/07 1045) ?Resp:  [15-27] 26 (04/07 1045) ?BP: (96-159)/(48-77) 125/60 (04/07 1045) ?SpO2:  [94 %-100 %] 100 % (04/07 1045) ?FiO2 (%):  [40 %] 40 % (04/07 0234) ?Weight:  [81.7 kg] 81.7 kg (04/07 0915) ? ?General: lying in bed, NAD ?Neuro: winces to noxious stimulation but doesn't open eyes or follow commands, pupils sluggishly reacting to light, no forced gaze deviation, roving eye movements with downward gaze preference, withdraws to noxious stimuli in all extremities  ? ?Basic Metabolic Panel: ?Recent Labs  ?Lab 10/06/21 ?1909 10/06/21 ?1909 10/07/21 ?7846 10/08/21 ?0225 10/10/21 ?9629 10/11/21 ?5284 10/12/21 ?1324 10/13/21 ?0130  ?NA  --    < > 136 139 141 135 137 136  ?K 4.3  --  4.2 3.7 4.5 4.6 5.0 5.7*  ?CL  --    < > 98 97* 103 99 101 100  ?CO2  --    < > '23 26 22 26 23 '$ 21*  ?GLUCOSE  --    < > 239* 325* 156* 251* 120* 134*  ?BUN  --    < > 47* 30* 70* 39* 66* 95*  ?CREATININE  --    < > 5.47* 3.74* 6.41* 3.92* 5.32* 6.60*  ?CALCIUM  --    < > 7.4* 8.1* 8.5* 8.5* 8.6* 8.4*  ?MG 1.6*  --  1.8  --  2.1  --   --   --   ?PHOS 5.7*  --  5.9*  --   --   --   --   --   ? < > = values in this interval not displayed.  ? ? ?CBC: ?Recent Labs  ?Lab 10/08/21 ?0225 10/10/21 ?0645 10/11/21 ?4010 10/12/21 ?2725 10/13/21 ?0130  ?WBC 8.4 10.9* 10.3 9.9 9.4  ?HGB 9.0* 9.0* 9.0* 8.3* 7.6*  ?HCT 27.5* 27.6* 27.7* 25.5* 23.9*  ?MCV 105.4* 105.7* 104.9* 105.4* 104.4*  ?PLT 187 170 152 155 145*  ? ? ? ?Coagulation Studies: ?No results for input(s): LABPROT, INR in the last 72 hours. ? ?Imaging ?NO new brain imaging ? ? ?ASSESSMENT AND PLAN: 51 year old female with seizures and altered mental status in the  setting of systolic blood pressure 366 which has since improved. ?  ?Seizure ?Posterior reversible encephalopathy syndrome (PRES) ?Acute encephalopathy due to PRES, seizures ?-No definite seizures overnight. Provoked seizures likely due to PRES ?-Encephalopathy appears to be out of proportion to imaging findings. Has been off sedation for more than 72 hours ? ?  ?Recommendations ?-Reduce Keppra to 250 mg twice daily to minimize sedation ?-Continue vimpat 75 mg BID ?- Will obtain EEG to look for any change since prior eeg. ?- will start provigil '100mg'$  daily to promote wakefulness ?- will start  B12 replacementas b12 level was low normal ?- patient presented with status epilepticus likely due to PRES. However, seizures have been controlled, BP has been controlled and patient has been off sedation for over 72 hours but continues to be comatose.  ?- We will repeat MRI brain wo contrast to assess for any change in imaging findings.  ?-Continue seizure precautions ?-As needed IV Ativan 2 mg for clinical seizure-like activity ?-Management of rest of comorbidities  per primary team ?- Plan discussed with critical care team ?  ?I have spent a total of 55 minutes with the patient reviewing hospital notes,  test results, labs and examining the patient as well as establishing an assessment and plan. > 50% of time was spent in direct patient care. ? ? ?Zeb Comfort ?Epilepsy ?[facility] ?For questions after 5pm please refer to AMION to reach the Neurologist on call ? ?

## 2021-10-13 NOTE — Progress Notes (Signed)
Pt placed in PSV by RT, pt tolerating well at this time. RN aware, RT will continue to monitor. ?

## 2021-10-13 NOTE — Progress Notes (Signed)
eLink Physician-Brief Progress Note ?Patient Name: Melanie Hall ?DOB: [birth year] ?MRN: [id number] ? ? ?Date of Service ? 10/13/2021  ?HPI/Events of Note ? Request for bilateral wrist restraints  ?eICU Interventions ? Ordered for vented patient  ? ? ? ?Intervention Category ?Minor Interventions: Agitation / anxiety - evaluation and management ? ?Shemicka Cohrs Rodman Pickle ?10/13/2021, 1:34 AM ?

## 2021-10-13 NOTE — Progress Notes (Signed)
EEG complete - results pending 

## 2021-10-13 NOTE — Progress Notes (Addendum)
? ?NAME:  Melanie Hall, MRN:  [id number], DOB:  1970/11/04, LOS: 9 ?ADMISSION DATE:  09/13/2021, CONSULTATION DATE:  09/22/2021 ?REFERRING MD:  Dr. Roderic Palau, ER, CHIEF COMPLAINT:  AMS  ? ?History of Present Illness:  ?51 yo female former smoker with hx of ESRD on HD was found at her home by patient transport driver unresponsive.  Pt's neighbor noted that she was tapping her hands for about a minute, and then stopped.  She has snoring and drooling.  No improvement after narcan.  CBG normal.  Noted to have BP 215/96.  Intubated in [facility] for airway protection, given keppra load, started on diprivan, and started on antibiotics.  CT head negative for acute findings.  Treated for hyperkalemia.  Pt's daughter report she had similar episode on 09/24/21 and EMS was called to the house, but episode resolve and patient declined hospital evaluation.  No prior history of seizures or known head injury.  ER team d/w case with neurology and advised to transfer to [facility] for EEG monitoring and further neurology assessment. ? ?Hx from medical team, patients daughter at bedside, and medical records. ? ?Pertinent  Medical History  ?CHF, Anemia, OA, CAD, DM type 2, ESRD on HD, HLD, HTN, CVA ? ?Significant Hospital Events: ?Including procedures, antibiotic start and stop dates in addition to other pertinent events   ?3/29 present to [facility], intubated, transfer to [facility] ?3/30 MRI Brain consistent with PRES ?4/3 off sedation, not waking up ?4/4 transitioned off insulin gtt to SSI ?4/5 [place] conversation; see IPAL note ? ?Interim History / Subjective:  ?No events overnight. Undergoing HD this AM.  ? ?Objective   ?Blood pressure 103/64, pulse 81, temperature 98.2 ?F (36.8 ?C), temperature source Axillary, resp. rate (!) 27, height '5\' 3"'$  (1.6 m), weight 81.7 kg, last menstrual period 12/17/2016, SpO2 100 %. ?   ?Vent Mode: PSV;CPAP ?FiO2 (%):  [40 %] 40 % ?Set Rate:  [18 bmp] 18 bmp ?Vt Set:  [460 mL] 460 mL ?PEEP:  [5 cmH20] 5 cmH20 ?Pressure Support:  [8  cmH20-10 cmH20] 10 cmH20 ?Plateau Pressure:  [13 cmH20-14 cmH20] 14 cmH20  ? ?Intake/Output Summary (Last 24 hours) at 10/13/2021 1016 ?Last data filed at 10/13/2021 0700 ?Gross per 24 hour  ?Intake 1300 ml  ?Output 375 ml  ?Net 925 ml  ? ?Filed Weights  ? 10/12/21 0500 10/13/21 0450 10/13/21 0915  ?Weight: 81.3 kg 81.7 kg 81.7 kg  ? ?Physical Exam: ?General:  critically ill appearing adult female on vent  ?HEENT: ETT/OG in place ?Neuro: withdrawals in extremities, does not follow commands, down-ward gaze, nystagmus noted   ?CV: RRR, HR 80, no MRG ?PULM: coarse breath sounds, vent assisted breaths  ?GI: soft, active bowel sounds  ?Extremities: warm/dry, no edema  ?Skin: no rashes or lesions appreciated ? ?Resolved Hospital Problem list   ? ? ?Assessment & Plan:  ? 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 No seizures seen on spot EEG but showed cortical dysfunction from the left hemisphere.  CT head did not show any acute intracranial hemorrhage/mass/lesion.  No dural sinus thrombus. There are mild stenosis of the left ICA but should not cause her issue. CVA should not cause this extent of encephalopathy and a large stroke should appear on CT. ?P: ?-neuro following; appreciate recs ?-neuro weaning keppra (decreased to 500 mg BID 4/6, decreasing to 250 mg BID today), continue vimpat ?-Obtaining EEG ?-Adding Provigil  ?-prn ativan for seizures ?-Continue  frequent neuro checks  ?-seizure precautions in place ?-cont thiamine, folic acid, mvi ?-limit sedating meds ? ?Acute respiratory failure secondary to probable aspiration PNA ?Respiratory Insufficieny in setting of encephalopathy  ?-completed rocephin x 5 days 4/2 ?P: ?-cont mech vent PRVC 4-8 cc/kg ?-wean fio2 for sats >92% ?-VAP prevention in place ?-Daily SBT/SAT when able; not able to extubate due to mental status ? ?ESRD on iHD. ?Hyperkalemia -resolved ?P: ?-nephro  following ?-continue iHD >> undergoing session this AM  ?-Trend BMP / urinary output ?-Replace electrolytes as indicated ?-Avoid nephrotoxic agents, ensure adequate renal perfusion ? ?Hx of HTN, chronic HFrEF, CAD, HLD. ?P: ?-cont carvedilol and hydralazine ?-cont ASA, statin, zetia ?-daily weights; strict I/o's ? ?DM type 2 poorly controlled with hyperglycemia. ?P: ?-cont TF coverage and basal insulin  ?-SSI and cbg monitoring ? ?Hx of hypothyroidism.. TSH WNL ?P: ?-cont home synthroid ? ?Stage 1 sacral pressure injury and deep tissue pressure injury right Toe, present on admission  ?P: ?-cont wound care ? ? ?Best Practice (right click and "Reselect all SmartList Selections" daily)  ? ?Diet/type: tubefeeds ?DVT prophylaxis: prophylactic heparin  ?GI prophylaxis: PPI ?Lines: N/A ?Foley:  N/A ?Code Status:  full code ? ?Last date of multidisciplinary goals of care discussion [3/31] Updated daughter, [name], on patient's critical condition. Remains full code. Ongoing goals of care discussions with family.  At this point looks like this would be a prolonged recovery with unknown residual deficits.  Anticipate that she would likely not be able to live independently in the community in the future without significant assistance.  ?4/5 see separate IPAL note; continue current care; full code. Family wants a few more days to see if she will wake up first. Will readdress trach vs. Comfort care early next week. ?4/6 called daughter [name] and gave update over phone  ?4/7 will reach back out to family for update on plan  ? ?Critical Care time: 36 minutes ? ?Hayden Pedro, AGACNP-BC ?[facility]  ?PCCM Pgr: [phone] ? ? ? ?

## 2021-10-14 ENCOUNTER — Inpatient Hospital Stay (HOSPITAL_COMMUNITY): Payer: Medicaid Other

## 2021-10-14 DIAGNOSIS — J9600 Acute respiratory failure, unspecified whether with hypoxia or hypercapnia: Secondary | ICD-10-CM | POA: Diagnosis not present

## 2021-10-14 DIAGNOSIS — Z7189 Other specified counseling: Secondary | ICD-10-CM

## 2021-10-14 DIAGNOSIS — I6783 Posterior reversible encephalopathy syndrome: Secondary | ICD-10-CM | POA: Diagnosis not present

## 2021-10-14 DIAGNOSIS — R4182 Altered mental status, unspecified: Secondary | ICD-10-CM | POA: Diagnosis not present

## 2021-10-14 LAB — CBC
HCT: 25.3 % — ABNORMAL LOW (ref 36.0–46.0)
Hemoglobin: 8.1 g/dL — ABNORMAL LOW (ref 12.0–15.0)
MCH: 33.2 pg (ref 26.0–34.0)
MCHC: 32 g/dL (ref 30.0–36.0)
MCV: 103.7 fL — ABNORMAL HIGH (ref 80.0–100.0)
Platelets: 175 10*3/uL (ref 150–400)
RBC: 2.44 MIL/uL — ABNORMAL LOW (ref 3.87–5.11)
RDW: 16 % — ABNORMAL HIGH (ref 11.5–15.5)
WBC: 10.2 10*3/uL (ref 4.0–10.5)
nRBC: 0 % (ref 0.0–0.2)

## 2021-10-14 LAB — BASIC METABOLIC PANEL
Anion gap: 11 (ref 5–15)
BUN: 50 mg/dL — ABNORMAL HIGH (ref 6–20)
CO2: 28 mmol/L (ref 22–32)
Calcium: 8.6 mg/dL — ABNORMAL LOW (ref 8.9–10.3)
Chloride: 99 mmol/L (ref 98–111)
Creatinine, Ser: 3.97 mg/dL — ABNORMAL HIGH (ref 0.44–1.00)
GFR, Estimated: 13 mL/min — ABNORMAL LOW (ref >60)
Glucose, Bld: 104 mg/dL — ABNORMAL HIGH (ref 70–99)
Potassium: 5.1 mmol/L (ref 3.5–5.1)
Sodium: 138 mmol/L (ref 135–145)

## 2021-10-14 LAB — GLUCOSE, CAPILLARY
Glucose-Capillary: 102 mg/dL — ABNORMAL HIGH (ref 70–99)
Glucose-Capillary: 104 mg/dL — ABNORMAL HIGH (ref 70–99)
Glucose-Capillary: 124 mg/dL — ABNORMAL HIGH (ref 70–99)
Glucose-Capillary: 138 mg/dL — ABNORMAL HIGH (ref 70–99)
Glucose-Capillary: 143 mg/dL — ABNORMAL HIGH (ref 70–99)
Glucose-Capillary: 89 mg/dL (ref 70–99)

## 2021-10-14 LAB — PHOSPHORUS: Phosphorus: 6.2 mg/dL — ABNORMAL HIGH (ref 2.5–4.6)

## 2021-10-14 LAB — MAGNESIUM: Magnesium: 1.8 mg/dL (ref 1.7–2.4)

## 2021-10-14 IMAGING — MR MR HEAD W/O CM
16 of 17 series · 40 of 48 positions shown · non-contrast
Comparison: None.

CLINICAL DATA: Encephalopathy

EXAM:
MRI HEAD WITHOUT CONTRAST
TECHNIQUE: Multiplanar, multiecho pulse sequences of the brain and surrounding
structures were obtained without intravenous contrast.

[Series 5: DWI · axial · 3.0mm · 0.88mm/px · z∈[-49,+95]mm · 6 of 100 slices shown (1 of 4)]
[im 1/100]
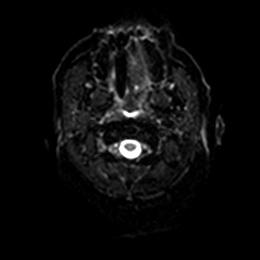
[im 20/100]
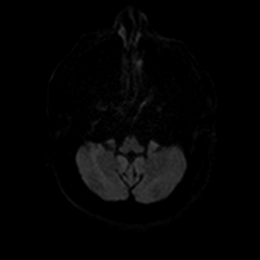
[im 40/100]
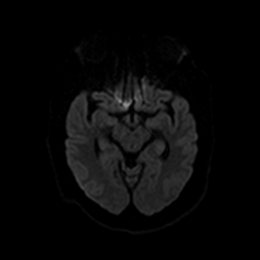
[im 60/100]
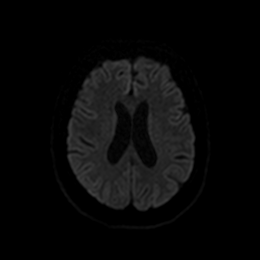
[im 80/100]
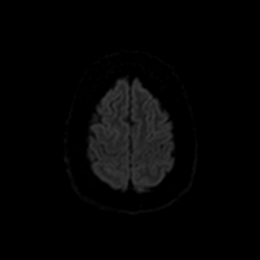
[im 100/100]
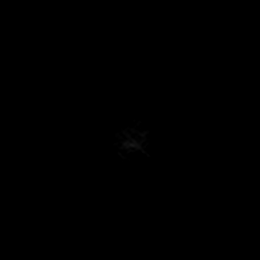

[Series 6: DWI · axial · 3.0mm · 0.88mm/px · z∈[-49,+95]mm · 3 of 50 slices shown (2 of 4)]
[im 1/50]
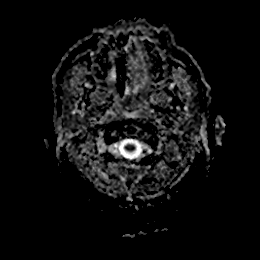
[im 25/50]
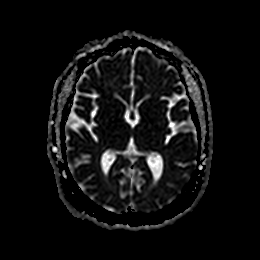
[im 50/50]
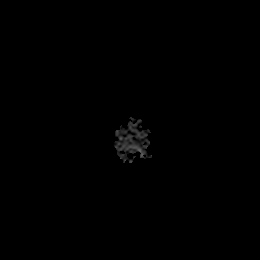

[Series 7: DWI · coronal · 4.0mm · 0.88mm/px · 3 of 64 slices shown (3 of 4)]
[im 1/64]
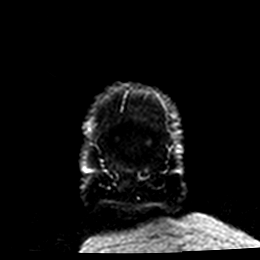
[im 32/64]
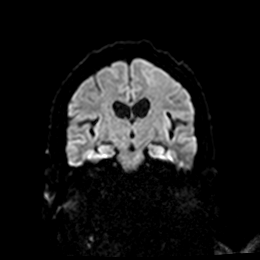
[im 64/64]
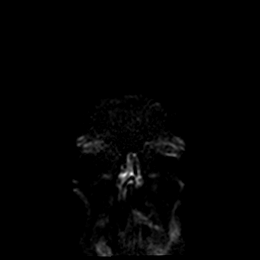

[Series 8: DWI · coronal · 4.0mm · 0.88mm/px · 2 of 32 slices shown (4 of 4)]
[im 1/32]
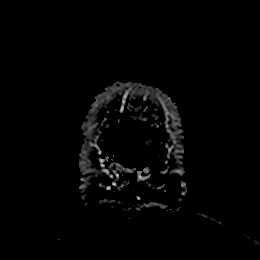
[im 32/32]
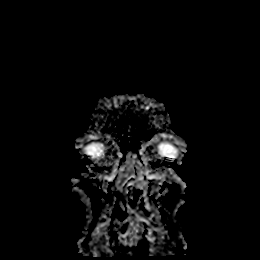

[Series 9: T1 · sagittal · 5.0mm · 0.75mm/px · 1 of 23 slices shown (1 of 2)]
[im 1/23]
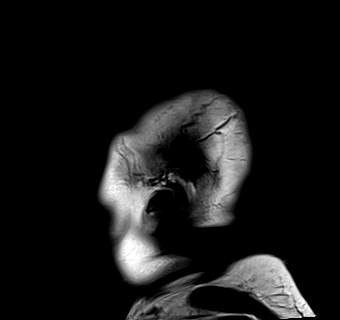

[Series 10: T2 · axial · 5.0mm · 0.72mm/px · 1 of 25 slices shown (1 of 3)]
[im 1/25]
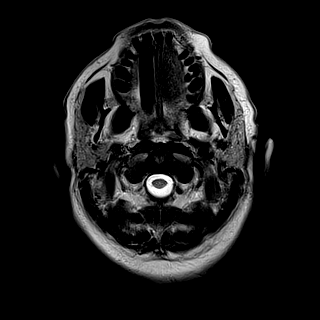

[Series 11: FLAIR · axial · 5.0mm · 0.45mm/px · 1 of 25 slices shown (1 of 3)]
[im 1/25]
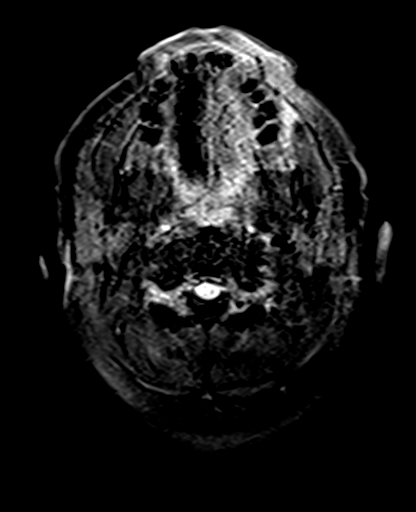

[Series 13: pha_images · axial · 3.0mm · 0.90mm/px · z∈[-63,+105]mm · 3 of 58 slices shown]
[im 1/58]
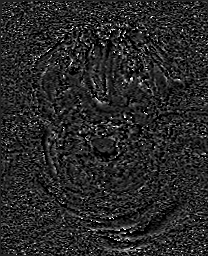
[im 29/58]
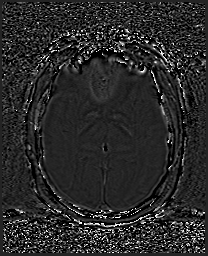
[im 58/58]
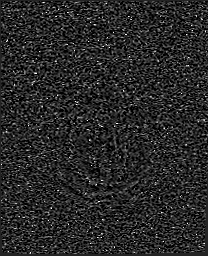

[Series 14: swi_images · axial · 3.0mm · 0.90mm/px · z∈[-66,+108]mm · 3 of 60 slices shown]
[im 1/60]
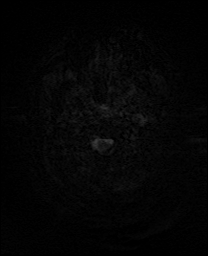
[im 30/60]
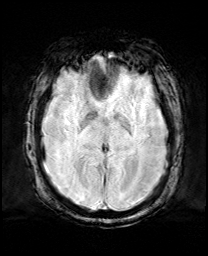
[im 60/60]
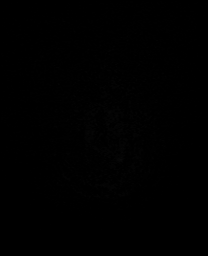

[Series 17: t1_mprage_tra_p2_iso · axial · 1.0mm · 0.98mm/px · z∈[-46,+123]mm · 8 of 176 slices shown]
[im 1/176]
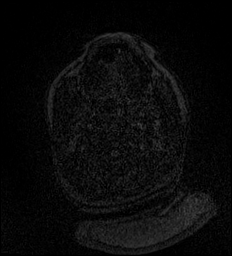
[im 22/176]
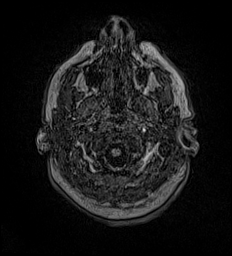
[im 44/176]
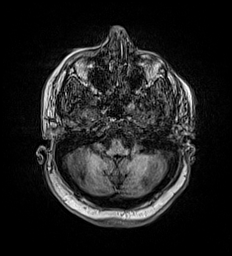
[im 66/176]
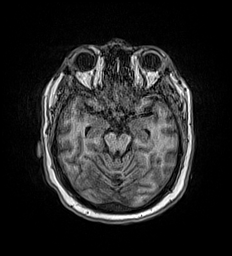
[im 110/176]
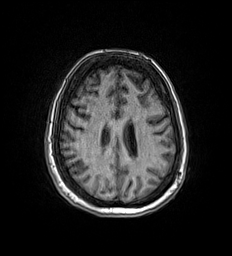
[im 132/176]
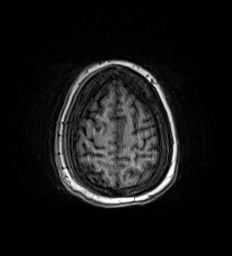
[im 154/176]
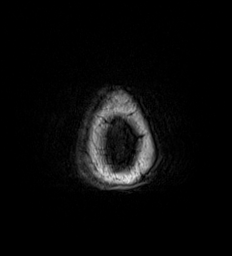
[im 176/176]
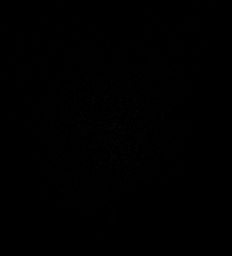

[Series 18: t1_mprage_tra_p2_iso_mpr_coronal · coronal · 1.0mm · 0.45mm/px · 2 of 120 slices shown]
[im 1/120]
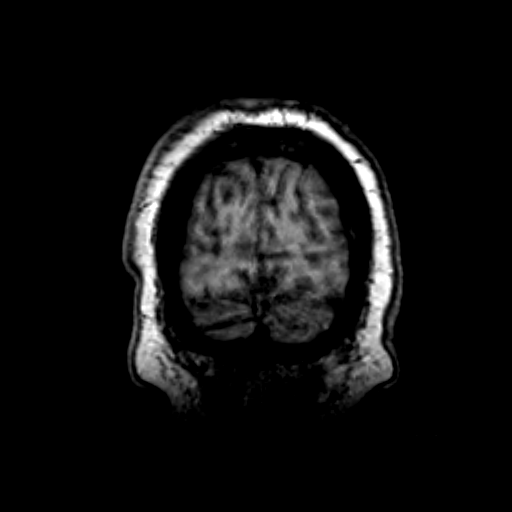
[im 24/120]
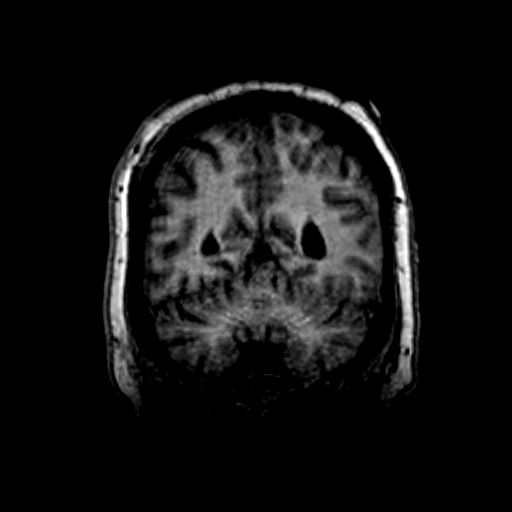

[Series 19: T2 · coronal · 3.0mm · 0.27mm/px · 2 of 32 slices shown (2 of 3)]
[im 1/32]
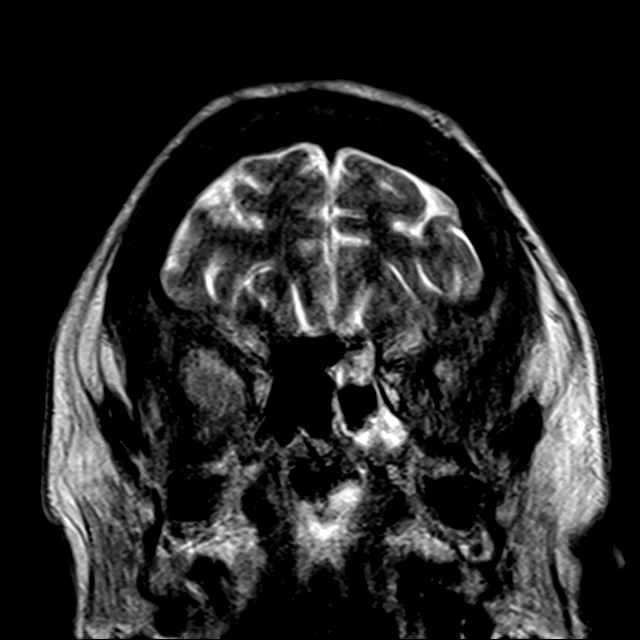
[im 32/32]
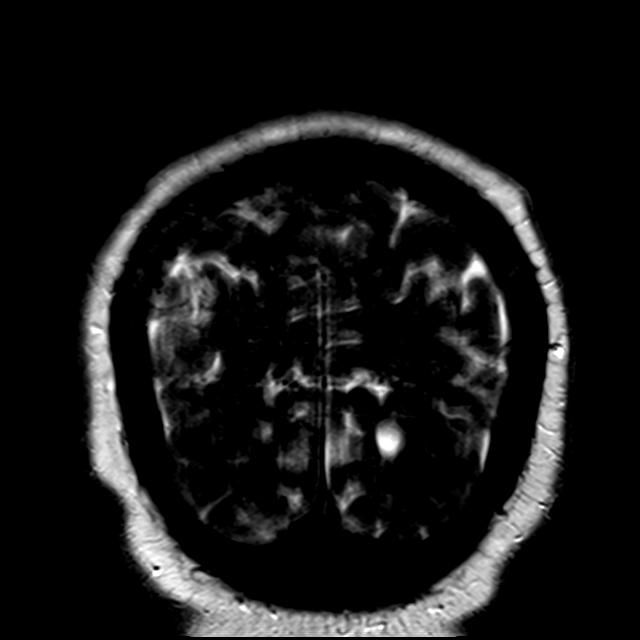

[Series 20: FLAIR · coronal · 3.0mm · 0.56mm/px · 1 of 21 slices shown (2 of 3)]
[im 1/21]
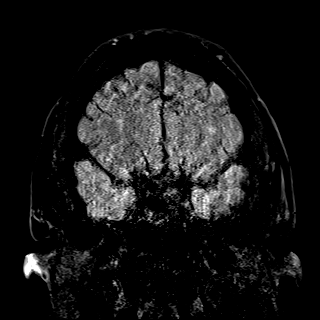

[Series 21: T2 · coronal · 5.0mm · 0.34mm/px · 2 of 29 slices shown (3 of 3)]
[im 1/29]
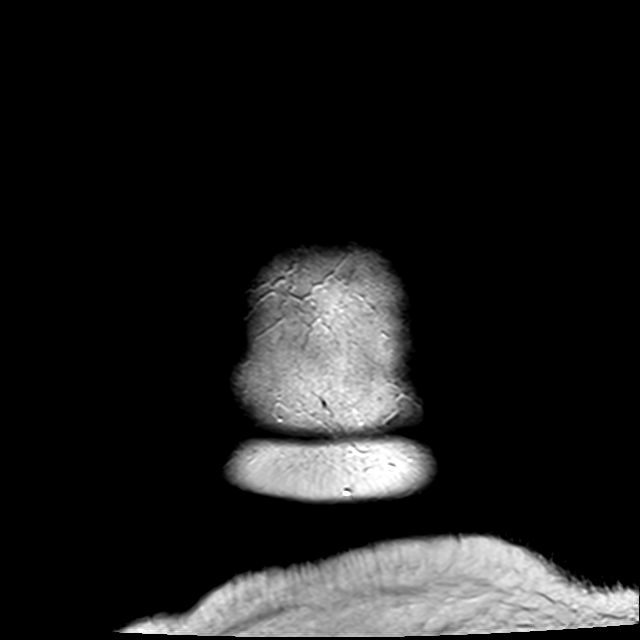
[im 29/29]
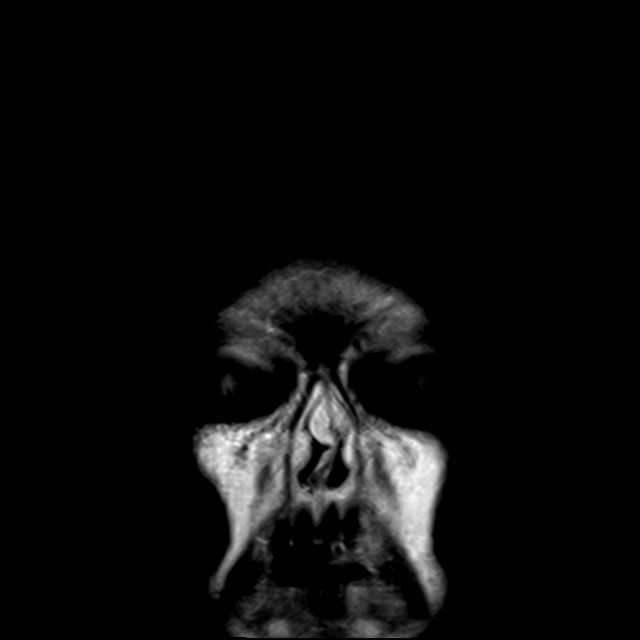

[Series 22: T1 · sagittal · 5.0mm · 0.75mm/px · 1 of 23 slices shown (2 of 2)]
[im 1/23]
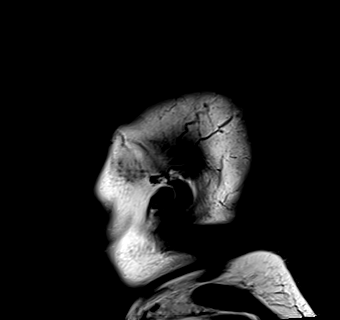

[Series 23: FLAIR · coronal · 3.0mm · 0.56mm/px · 1 of 21 slices shown (3 of 3)]
[im 1/21]
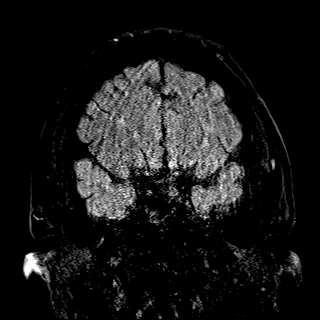

[40 of 48 positions shown; findings below may reference images not displayed]

FINDINGS: Brain: No acute infarct, mass effect or extra-axial collection. No
acute or chronic hemorrhage. Normal white matter signal. Generalized
volume loss without a clear lobar predilection. The midline
structures are normal.

Vascular: Major flow voids are preserved.

Skull and upper cervical spine: Normal calvarium and skull base.
Visualized upper cervical spine and soft tissues are normal.

Sinuses/Orbits:No paranasal sinus fluid levels or advanced mucosal
thickening. No mastoid or middle ear effusion. Normal orbits.
IMPRESSION: 1. No acute intracranial abnormality.
2. Generalized volume loss without a clear lobar predilection.

## 2021-10-14 MED ORDER — INSULIN DETEMIR 100 UNIT/ML ~~LOC~~ SOLN
20.0000 [IU] | Freq: Two times a day (BID) | SUBCUTANEOUS | Status: DC
  Administered 2021-10-14 (×2): 20 [IU] via SUBCUTANEOUS
  Filled 2021-10-14 (×5): qty 0.2

## 2021-10-14 MED ORDER — FENTANYL CITRATE (PF) 100 MCG/2ML IJ SOLN
INTRAMUSCULAR | Status: AC
  Filled 2021-10-14: qty 2

## 2021-10-14 MED ORDER — CALCITRIOL 1 MCG/ML PO SOLN
0.2500 ug | ORAL | Status: DC
  Administered 2021-10-14 – 2021-10-31 (×8): 0.25 ug
  Filled 2021-10-14 (×9): qty 0.25

## 2021-10-14 MED ORDER — SODIUM CHLORIDE 0.9 % IV SOLN
62.5000 mg | INTRAVENOUS | Status: DC
  Administered 2021-10-20 – 2021-10-26 (×2): 62.5 mg via INTRAVENOUS
  Filled 2021-10-14 (×4): qty 5

## 2021-10-14 NOTE — Progress Notes (Signed)
? ?NAME:  Melanie Hall, MRN:  [id number], DOB:  1971-07-05, LOS: 10 ?ADMISSION DATE:  10/03/2021, CONSULTATION DATE:  09/16/2021 ?REFERRING MD:  Dr. Roderic Palau, ER, CHIEF COMPLAINT:  AMS  ? ?History of Present Illness:  ?51 yo female former smoker with hx of ESRD on HD was found at her home by patient transport driver unresponsive.  Pt's neighbor noted that she was tapping her hands for about a minute, and then stopped.  She has snoring and drooling.  No improvement after narcan.  CBG normal.  Noted to have BP 215/96.  Intubated in [facility] for airway protection, given keppra load, started on diprivan, and started on antibiotics.  CT head negative for acute findings.  Treated for hyperkalemia.  Pt's daughter report she had similar episode on 09/24/21 and EMS was called to the house, but episode resolve and patient declined hospital evaluation.  No prior history of seizures or known head injury.  ER team d/w case with neurology and advised to transfer to [facility] for EEG monitoring and further neurology assessment. ? ?Hx from medical team, patients daughter at bedside, and medical records. ? ?Pertinent  Medical History  ?CHF, Anemia, OA, CAD, DM type 2, ESRD on HD, HLD, HTN, CVA ? ?Significant Hospital Events: ?Including procedures, antibiotic start and stop dates in addition to other pertinent events   ?3/29 present to [facility], intubated, transfer to [facility] ?3/30 MRI Brain consistent with PRES ?4/3 off sedation, not waking up ?4/4 transitioned off insulin gtt to SSI ?4/5 [place] conversation; see IPAL note ?4/8 MRI with no acute findings ? ?Interim History / Subjective:  ?No events overnight.  ?Posturing ?Started on Provigil with no significant change in mental status, still remains off sedation ?Tolerating pressure support ? ?Objective   ?Blood pressure (!) 130/53, pulse 90, temperature 98.1 ?F (36.7 ?C), temperature source Oral, resp. rate (!) 26, height '5\' 3"'$  (1.6 m), weight 81.2 kg, last menstrual period 12/17/2016, SpO2 100 %. ?   ?Vent  Mode: PSV;CPAP ?FiO2 (%):  [40 %] 40 % ?Set Rate:  [15 bmp] 15 bmp ?Vt Set:  [450 mL] 450 mL ?PEEP:  [5 cmH20] 5 cmH20 ?Pressure Support:  [8 cmH20-10 cmH20] 8 cmH20 ?Plateau Pressure:  [20 cmH20-23 cmH20] 22 cmH20  ? ?Intake/Output Summary (Last 24 hours) at 10/14/2021 0953 ?Last data filed at 10/14/2021 0800 ?Gross per 24 hour  ?Intake 1300 ml  ?Output 2450 ml  ?Net -1150 ml  ? ?Filed Weights  ? 10/13/21 0450 10/13/21 0915 10/14/21 0433  ?Weight: 81.7 kg 81.7 kg 81.2 kg  ? ?Physical Exam: ?General: Chronically ill-appearing ?HEENT: Endotracheal tube in place ?Neuro: Nystagmus, downward gaze, posturing ?CV: S1-S2 appreciated ?PULM: Clear breath sounds anteriorly ?GI: soft, active bowel sounds  ?Extremities: Skin is warm/dry ?Skin: Erythema dorsum of left hand ? ?Resolved Hospital Problem list   ? ? ?Assessment & Plan:  ? 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 ?Acute respiratory failure secondary to probable aspiration pneumonia ?Respiratory insufficiency in the setting of encephalopathy ?-Completed Rocephin ?-Tolerating pressure support ?-VAP prevention in place ? ?-With persistent encephalopathy will need tracheostomy ?-Mental status will be a barrier to extubation ? ?End-stage renal disease on hemodialysis ?-Continue intermittent hemodialysis- ?-Trend  electrolytes ?-Replace electrolytes as needed ? ?History of hypertension, chronic heart failure with reduced ejection fraction, coronary artery disease, hyperlipidemia ?-On carvedilol, hydralazine, aspirin, statin, Zetia ? ?Type 2 diabetes ?-Continue SSI and  insulin coverage ? ?Hypothyroidism ?-Continue home Synthroid ? ?Sacral pressure ulcer ?-Continue wound care ? ? ?Best Practice (right click and "Reselect all SmartList Selections" daily)  ? ?Diet/type: tubefeeds ?DVT prophylaxis: prophylactic heparin  ?GI prophylaxis: PPI ?Lines: N/A ?Foley:  N/A ?Code Status:  full code ? ?Last date of multidisciplinary goals of care discussion [3/31] Updated daughter, [name], on patient's critical condition. Remains full code. Ongoing goals of care discussions with family.  At this point looks like this would be a prolonged recovery with unknown residual deficits.  Anticipate that she would likely not be able to live independently in the community in the future without significant assistance.  ?4/5 see separate IPAL note; continue current care; full code. Family wants a few more days to see if she will wake up first. Will readdress trach vs. Comfort care early next week. ?4/6 called daughter [name] and gave update over phone  ?4/7 will reach back out to family for update on plan  ?4/8 updated daughter at bedside today, planning for meeting during the week ?-Continue to discuss possible need for trach.  Patient remains profoundly encephalopathic ? ?The patient is critically ill with multiple organ systems failure and requires high complexity decision making for assessment and support, frequent evaluation and titration of therapies, application of advanced monitoring technologies and extensive interpretation of multiple databases. Critical Care Time devoted to patient care services described in this note independent of APP/resident time (if applicable)  is 40 minutes.  ? ?Sherrilyn Rist MD ?[facility] ?Personal pager: See Shea Evans ?If unanswered, please page ?CCM On-call: [phone] ? ? ?

## 2021-10-14 NOTE — Progress Notes (Signed)
Pt placed back full vent support due to tachypnea and increased WOB. Pt tolerating well, RN aware, RT will continue to monitor.  ?

## 2021-10-14 NOTE — Progress Notes (Signed)
eLink Physician-Brief Progress Note ?Patient Name: Melanie Hall ?DOB: 07/09/1971 ?MRN: [id number] ? ? ?Date of Service ? 10/14/2021  ?HPI/Events of Note ? Received request for restraints ?Patient seen intubated and a risk for self harm by pulling lines and tubes   ?eICU Interventions ? Bilateral soft wrist restraints ordered ?Bedside team to assess in am if restraints to be continued   ? ? ? ?Intervention Category ?Intermediate Interventions: Other: ? ?Judd Lien ?10/14/2021, 10:46 PM ?

## 2021-10-14 NOTE — Progress Notes (Signed)
Admit: 09/15/2021 ?LOS: 10 ? ? ?Subjective: no new issues, seen in ICU. 2 L off w/ HD yest ? ?04/07 0701 - 04/08 0700 ?In: 1150 [NG/GT:1150] ?Out: 2200 [Stool:200] ? ?Filed Weights  ? 10/13/21 0450 10/13/21 0915 10/14/21 0433  ?Weight: 81.7 kg 81.7 kg 81.2 kg  ? ? ?Scheduled Meds: ? aspirin  81 mg Per Tube Daily  ? atorvastatin  80 mg Per Tube q1800  ? calcitRIOL  0.25 mcg Per Tube Q T,Th,Sa-HD  ? carvedilol  25 mg Per Tube BID WC  ? chlorhexidine gluconate (MEDLINE KIT)  15 mL Mouth Rinse BID  ? Chlorhexidine Gluconate Cloth  6 each Topical Q0600  ? darbepoetin (ARANESP) injection - NON-DIALYSIS  150 mcg Subcutaneous Q Fri-1800  ? ezetimibe  10 mg Per Tube Daily  ? feeding supplement (PROSource TF)  45 mL Per Tube QID  ? fentaNYL      ? fiber  1 packet Per Tube TID  ? folic acid  1 mg Per Tube Daily  ? heparin injection (subcutaneous)  5,000 Units Subcutaneous Q8H  ? hydrALAZINE  10 mg Per Tube BID  ? insulin aspart  0-20 Units Subcutaneous Q4H  ? insulin aspart  8 Units Subcutaneous Q4H  ? insulin detemir  30 Units Subcutaneous Q12H  ? lacosamide  75 mg Per Tube BID  ? leptospermum manuka honey  1 application. Topical UD  ? levETIRAcetam  250 mg Per Tube BID  ? levothyroxine  50 mcg Per Tube QAC breakfast  ? mouth rinse  15 mL Mouth Rinse 10 times per day  ? modafinil  100 mg Per Tube Daily  ? multivitamin  1 tablet Per Tube QHS  ? pantoprazole sodium  40 mg Per Tube Daily  ? thiamine  100 mg Per Tube Daily  ? vitamin B-12  1,000 mcg Per Tube Daily  ? ?Continuous Infusions: ? sodium chloride 10 mL/hr at 10/12/21 1500  ? sodium chloride    ? feeding supplement (VITAL 1.5 CAL) 1,000 mL (10/13/21 1615)  ? [START ON 10/18/2021] ferric gluconate (FERRLECIT) IVPB    ? [START ON 10/19/2021] ferric gluconate (FERRLECIT) IVPB    ? ?PRN Meds:.sodium chloride, acetaminophen, docusate, fentaNYL (SUBLIMAZE) injection, heparin, hydrALAZINE, loperamide HCl, LORazepam, polyethylene glycol ? ?Current Labs: reviewed  ? ? ?Physical  Exam:  Blood pressure 96/78, pulse 87, temperature 100.1 ?F (37.8 ?C), temperature source Axillary, resp. rate 11, height _0  (1.6 m), weight 81.2 kg, last menstrual period 12/17/2016, SpO2 100 %. ?GEN: Intubated ?ENT: ET tube in oropharynx ?EYES: Eyes closed ?CV: Regular, normal S1 and S2 ?PULM: Coarse breath sounds bilaterally ?ABD: Soft ?SKIN: No rashes or lesions ?EXT: no sig edema  ?VASCULAR: LUA AVF +bruit ? ? ? OP HD: TTS [facility] ?  3h 7mn  75.5kg  2/2.5 bath  400 bfr   Hep none LUE AVF ? - 3/30 here > hep B Ag negative and hep B Ab's low/ not protective ? - mircera 200 q2 wks ? - venofer 50 weekly ? - rocaltrol 0.25 tiw ? ?CXR 3/30 - no active disease ? ?Assessment/ Plan:  ?AMS - found unresponsive at home. Had PRES and seizures. MS remains poor. Neurology following.  ?VDRF- for airway protection ?ESRD - on TTS [facility]. HD today in ICU.  ?Volume - several kg from OP dry wt. 2 L off yest, goal UF 2-3 L today.  ?DM2 - per pmd ?Possible sepsis - sp short course IV abx. BCx x2 NGTD ?Anemia -  Hb 8-9s. Was on esa/ IV fe at OP unit, starting esa and IV Fe here as well.  ?CKD-BMM: CCa stable. Cont vdra tiw.  ?Nutrition: getting TF"s at 50 cc/hr ? ? ?Kelly Splinter, MD ?10/14/2021, 5:11 AM ? ?Recent Labs  ?Lab 10/12/21 ?0218 10/13/21 ?0130  ?HGB 8.3* 7.6*  ?ALBUMIN  --  2.6*  ?CALCIUM 8.6* 8.4*  ?CREATININE 5.32* 6.60*  ?K 5.0 5.7*  ?Inpatient medications: ? aspirin  81 mg Per Tube Daily  ? atorvastatin  80 mg Per Tube q1800  ? calcitRIOL  0.25 mcg Per Tube Q T,Th,Sa-HD  ? carvedilol  25 mg Per Tube BID WC  ? chlorhexidine gluconate (MEDLINE KIT)  15 mL Mouth Rinse BID  ? Chlorhexidine Gluconate Cloth  6 each Topical Q0600  ? darbepoetin (ARANESP) injection - NON-DIALYSIS  150 mcg Subcutaneous Q Fri-1800  ? ezetimibe  10 mg Per Tube Daily  ? feeding supplement (PROSource TF)  45 mL Per Tube QID  ? fentaNYL      ? fiber  1 packet Per Tube TID  ? folic acid  1 mg Per Tube Daily  ? heparin injection  (subcutaneous)  5,000 Units Subcutaneous Q8H  ? hydrALAZINE  10 mg Per Tube BID  ? insulin aspart  0-20 Units Subcutaneous Q4H  ? insulin aspart  8 Units Subcutaneous Q4H  ? insulin detemir  30 Units Subcutaneous Q12H  ? lacosamide  75 mg Per Tube BID  ? leptospermum manuka honey  1 application. Topical UD  ? levETIRAcetam  250 mg Per Tube BID  ? levothyroxine  50 mcg Per Tube QAC breakfast  ? mouth rinse  15 mL Mouth Rinse 10 times per day  ? modafinil  100 mg Per Tube Daily  ? multivitamin  1 tablet Per Tube QHS  ? pantoprazole sodium  40 mg Per Tube Daily  ? thiamine  100 mg Per Tube Daily  ? vitamin B-12  1,000 mcg Per Tube Daily  ? ? sodium chloride 10 mL/hr at 10/12/21 1500  ? sodium chloride    ? feeding supplement (VITAL 1.5 CAL) 1,000 mL (10/13/21 1615)  ? [START ON 10/18/2021] ferric gluconate (FERRLECIT) IVPB    ? [START ON 10/19/2021] ferric gluconate (FERRLECIT) IVPB    ? ?sodium chloride, acetaminophen, docusate, fentaNYL (SUBLIMAZE) injection, heparin, hydrALAZINE, loperamide HCl, LORazepam, polyethylene glycol ? ? ? ? ? ? ? ?  ?

## 2021-10-14 NOTE — Progress Notes (Signed)
Transported patient to MRI while patient was on the ventilator. Patient remained stable during transport. 

## 2021-10-14 NOTE — Progress Notes (Signed)
Assisted tele visit to patient with daughter. ? ?Melanie Hazel, RN   ?

## 2021-10-15 DIAGNOSIS — J9600 Acute respiratory failure, unspecified whether with hypoxia or hypercapnia: Secondary | ICD-10-CM | POA: Diagnosis not present

## 2021-10-15 DIAGNOSIS — Z7189 Other specified counseling: Secondary | ICD-10-CM | POA: Diagnosis not present

## 2021-10-15 DIAGNOSIS — R4182 Altered mental status, unspecified: Secondary | ICD-10-CM | POA: Diagnosis not present

## 2021-10-15 DIAGNOSIS — N186 End stage renal disease: Secondary | ICD-10-CM | POA: Diagnosis not present

## 2021-10-15 LAB — CBC
HCT: 26.1 % — ABNORMAL LOW (ref 36.0–46.0)
Hemoglobin: 8.4 g/dL — ABNORMAL LOW (ref 12.0–15.0)
MCH: 32.4 pg (ref 26.0–34.0)
MCHC: 32.2 g/dL (ref 30.0–36.0)
MCV: 100.8 fL — ABNORMAL HIGH (ref 80.0–100.0)
Platelets: 202 10*3/uL (ref 150–400)
RBC: 2.59 MIL/uL — ABNORMAL LOW (ref 3.87–5.11)
RDW: 15.9 % — ABNORMAL HIGH (ref 11.5–15.5)
WBC: 10.7 10*3/uL — ABNORMAL HIGH (ref 4.0–10.5)
nRBC: 0 % (ref 0.0–0.2)

## 2021-10-15 LAB — BASIC METABOLIC PANEL
Anion gap: 11 (ref 5–15)
BUN: 19 mg/dL (ref 6–20)
CO2: 28 mmol/L (ref 22–32)
Calcium: 9 mg/dL (ref 8.9–10.3)
Chloride: 97 mmol/L — ABNORMAL LOW (ref 98–111)
Creatinine, Ser: 1.69 mg/dL — ABNORMAL HIGH (ref 0.44–1.00)
GFR, Estimated: 36 mL/min — ABNORMAL LOW (ref >60)
Glucose, Bld: 69 mg/dL — ABNORMAL LOW (ref 70–99)
Potassium: 3.5 mmol/L (ref 3.5–5.1)
Sodium: 136 mmol/L (ref 135–145)

## 2021-10-15 LAB — MAGNESIUM: Magnesium: 1.9 mg/dL (ref 1.7–2.4)

## 2021-10-15 LAB — GLUCOSE, CAPILLARY
Glucose-Capillary: 131 mg/dL — ABNORMAL HIGH (ref 70–99)
Glucose-Capillary: 133 mg/dL — ABNORMAL HIGH (ref 70–99)
Glucose-Capillary: 137 mg/dL — ABNORMAL HIGH (ref 70–99)
Glucose-Capillary: 143 mg/dL — ABNORMAL HIGH (ref 70–99)
Glucose-Capillary: 149 mg/dL — ABNORMAL HIGH (ref 70–99)
Glucose-Capillary: 71 mg/dL (ref 70–99)

## 2021-10-15 LAB — PHOSPHORUS: Phosphorus: 2.4 mg/dL — ABNORMAL LOW (ref 2.5–4.6)

## 2021-10-15 MED ORDER — INSULIN ASPART 100 UNIT/ML IJ SOLN
4.0000 [IU] | INTRAMUSCULAR | Status: DC
  Administered 2021-10-15 – 2021-10-18 (×21): 4 [IU] via SUBCUTANEOUS

## 2021-10-15 MED ORDER — INSULIN DETEMIR 100 UNIT/ML ~~LOC~~ SOLN
10.0000 [IU] | Freq: Two times a day (BID) | SUBCUTANEOUS | Status: DC
Start: 2021-10-15 — End: 2021-10-16
  Administered 2021-10-15 (×2): 10 [IU] via SUBCUTANEOUS
  Filled 2021-10-15 (×4): qty 0.1

## 2021-10-15 MED ORDER — K PHOS MONO-SOD PHOS DI & MONO 155-852-130 MG PO TABS
500.0000 mg | ORAL_TABLET | Freq: Once | ORAL | Status: AC
Start: 2021-10-15 — End: 2021-10-15
  Administered 2021-10-15: 500 mg
  Filled 2021-10-15: qty 2

## 2021-10-15 MED ORDER — CHLORHEXIDINE GLUCONATE CLOTH 2 % EX PADS
6.0000 | MEDICATED_PAD | Freq: Every day | CUTANEOUS | Status: AC
  Administered 2021-10-15 – 2021-10-19 (×5): 6 via TOPICAL

## 2021-10-15 NOTE — Progress Notes (Signed)
Assisted tele visit to patient with daughter.  Dashawn Bartnick Ann, RN  

## 2021-10-15 NOTE — Progress Notes (Signed)
Admit: 09/24/2021 ?LOS: 11 ? ? ?Subjective: seen in ICU. Had HD overnight. Need wt's today.  ? ?04/08 0701 - 04/09 0700 ?In: 1439.2 [I.V.:289.2; NG/GT:1150] ?Out: 50 [Stool:50] ? ?Filed Weights  ? 10/13/21 0450 10/13/21 0915 10/14/21 0433  ?Weight: 81.7 kg 81.7 kg 81.2 kg  ? ? ?Scheduled Meds: ? aspirin  81 mg Per Tube Daily  ? atorvastatin  80 mg Per Tube q1800  ? calcitRIOL  0.25 mcg Per Tube Q T,Th,Sat-1800  ? carvedilol  25 mg Per Tube BID WC  ? chlorhexidine gluconate (MEDLINE KIT)  15 mL Mouth Rinse BID  ? Chlorhexidine Gluconate Cloth  6 each Topical Daily  ? darbepoetin (ARANESP) injection - NON-DIALYSIS  150 mcg Subcutaneous Q Fri-1800  ? ezetimibe  10 mg Per Tube Daily  ? feeding supplement (PROSource TF)  45 mL Per Tube QID  ? fiber  1 packet Per Tube TID  ? folic acid  1 mg Per Tube Daily  ? heparin injection (subcutaneous)  5,000 Units Subcutaneous Q8H  ? hydrALAZINE  10 mg Per Tube BID  ? insulin aspart  0-20 Units Subcutaneous Q4H  ? insulin aspart  8 Units Subcutaneous Q4H  ? insulin detemir  20 Units Subcutaneous Q12H  ? lacosamide  75 mg Per Tube BID  ? leptospermum manuka honey  1 application. Topical UD  ? levETIRAcetam  250 mg Per Tube BID  ? levothyroxine  50 mcg Per Tube QAC breakfast  ? mouth rinse  15 mL Mouth Rinse 10 times per day  ? modafinil  100 mg Per Tube Daily  ? multivitamin  1 tablet Per Tube QHS  ? pantoprazole sodium  40 mg Per Tube Daily  ? thiamine  100 mg Per Tube Daily  ? vitamin B-12  1,000 mcg Per Tube Daily  ? ?Continuous Infusions: ? sodium chloride Stopped (10/13/21 2359)  ? sodium chloride    ? feeding supplement (VITAL 1.5 CAL) 50 mL/hr at 10/15/21 0602  ? [START ON 10/19/2021] ferric gluconate (FERRLECIT) IVPB    ? ?PRN Meds:.sodium chloride, acetaminophen, docusate, fentaNYL (SUBLIMAZE) injection, heparin, hydrALAZINE, loperamide HCl, LORazepam, polyethylene glycol ? ?Current Labs: reviewed  ? ? ?Physical Exam:  Blood pressure 122/79, pulse 95, temperature 98.9 ?F  (37.2 ?C), temperature source Axillary, resp. rate (!) 29, height '5\' 3"'  (1.6 m), weight 81.2 kg, last menstrual period 12/17/2016, SpO2 100 %. ?GEN: Intubated ?ENT: ET tube in oropharynx ?EYES: Eyes closed ?CV: Regular, normal S1 and S2 ?PULM: Coarse breath sounds bilaterally ?ABD: Soft ?SKIN: No rashes or lesions ?EXT: no sig edema  ?VASCULAR: LUA AVF +bruit ? ? ? OP HD: TTS [facility] [place] ?  3h 61mn  75.5kg  2/2.5 bath  400 bfr   Hep none LUE AVF ? - 3/30 here > hep B Ag negative and hep B Ab's low/ not protective ? - mircera 200 q2 wks ? - venofer 50 weekly ? - rocaltrol 0.25 tiw ? ?CXR 3/30 - no active disease ? ?Assessment/ Plan:  ?AMS - found unresponsive at home. Had PRES and seizures. MS remains poor. Neurology following.  ?VDRF- for airway protection ?ESRD - on TTS [facility] [place]. HD today in ICU.  ?Volume - up 5-6 kg peak here. Increased UF w/ last 2 HD sessions, shoot for 74kg range this week as probably has lost body wt.  ?DM2 - per pmd ?Possible sepsis - sp short course IV abx. BCx x2 NGTD ?Anemia - Hb 8-9s. Was on esa/ IV fe at OP unit.  Cont darbe 150 ug q Friday and weekly IV Fe here.  ?CKD-BMM: CCa stable. Cont vdra tiw.  ?Nutrition: getting TF"s at 50 cc/hr ? ? ?Kelly Splinter, MD ?10/15/2021, 6:11 AM ? ?Recent Labs  ?Lab 10/13/21 ?0130 10/14/21 ?0258  ?HGB 7.6* 8.1*  ?ALBUMIN 2.6*  --   ?CALCIUM 8.4* 8.6*  ?PHOS  --  6.2*  ?CREATININE 6.60* 3.97*  ?K 5.7* 5.1  ? ?Inpatient medications: ? aspirin  81 mg Per Tube Daily  ? atorvastatin  80 mg Per Tube q1800  ? calcitRIOL  0.25 mcg Per Tube Q T,Th,Sat-1800  ? carvedilol  25 mg Per Tube BID WC  ? chlorhexidine gluconate (MEDLINE KIT)  15 mL Mouth Rinse BID  ? Chlorhexidine Gluconate Cloth  6 each Topical Daily  ? darbepoetin (ARANESP) injection - NON-DIALYSIS  150 mcg Subcutaneous Q Fri-1800  ? ezetimibe  10 mg Per Tube Daily  ? feeding supplement (PROSource TF)  45 mL Per Tube QID  ? fiber  1 packet Per Tube TID  ? folic acid  1 mg Per Tube Daily  ?  heparin injection (subcutaneous)  5,000 Units Subcutaneous Q8H  ? hydrALAZINE  10 mg Per Tube BID  ? insulin aspart  0-20 Units Subcutaneous Q4H  ? insulin aspart  8 Units Subcutaneous Q4H  ? insulin detemir  20 Units Subcutaneous Q12H  ? lacosamide  75 mg Per Tube BID  ? leptospermum manuka honey  1 application. Topical UD  ? levETIRAcetam  250 mg Per Tube BID  ? levothyroxine  50 mcg Per Tube QAC breakfast  ? mouth rinse  15 mL Mouth Rinse 10 times per day  ? modafinil  100 mg Per Tube Daily  ? multivitamin  1 tablet Per Tube QHS  ? pantoprazole sodium  40 mg Per Tube Daily  ? thiamine  100 mg Per Tube Daily  ? vitamin B-12  1,000 mcg Per Tube Daily  ? ? sodium chloride Stopped (10/13/21 2359)  ? sodium chloride    ? feeding supplement (VITAL 1.5 CAL) 50 mL/hr at 10/15/21 0602  ? [START ON 10/19/2021] ferric gluconate (FERRLECIT) IVPB    ? ?sodium chloride, acetaminophen, docusate, fentaNYL (SUBLIMAZE) injection, heparin, hydrALAZINE, loperamide HCl, LORazepam, polyethylene glycol ? ? ? ? ? ? ? ?  ?

## 2021-10-15 NOTE — Progress Notes (Signed)
? ?NAME:  Melanie Hall, MRN:  [id number], DOB:  July 28, 1970, LOS: 11 ?ADMISSION DATE:  10/03/2021, CONSULTATION DATE:  10/01/2021 ?REFERRING MD:  Dr. Roderic Hall, ER, CHIEF COMPLAINT:  AMS  ? ?History of Present Illness:  ?51 yo female former smoker with hx of ESRD on HD was found at her home by patient transport driver unresponsive.  Pt's neighbor noted that she was tapping her hands for about a minute, and then stopped.  She has snoring and drooling.  No improvement after narcan.  CBG normal.  Noted to have BP 215/96.  Intubated in [facility] for airway protection, given keppra load, started on diprivan, and started on antibiotics.  CT head negative for acute findings.  Treated for hyperkalemia.  Pt's daughter report she had similar episode on 09/24/21 and EMS was called to the house, but episode resolve and patient declined hospital evaluation.  No prior history of seizures or known head injury.  ER team d/w case with neurology and advised to transfer to [facility] for EEG monitoring and further neurology assessment. ? ?Hx from medical team, patients daughter at bedside, and medical records. ? ?Pertinent  Medical History  ?CHF, Anemia, OA, CAD, DM type 2, ESRD on HD, HLD, HTN, CVA ? ?Significant Hospital Events: ?Including procedures, antibiotic start and stop dates in addition to other pertinent events   ?3/29 present to [facility], intubated, transfer to [facility] ?3/30 MRI Brain consistent with PRES ?4/3 off sedation, not waking up ?4/4 transitioned off insulin gtt to SSI ?4/5 [place] conversation; see IPAL note ?4/8 MRI with no acute findings ? ?Interim History / Subjective:  ?No events overnight.  ?Did have some tachypnea through the night, on full vent support ?Provigil started 2 days ago, no significant change in mental status ? ?Objective   ?Blood pressure 132/81, pulse 93, temperature 100.2 ?F (37.9 ?C), temperature source Oral, resp. rate (!) 25, height '5\' 3"'$  (1.6 m), weight 80.4 kg, last menstrual period 12/17/2016, SpO2 100 %. ?   ?Vent  Mode: PSV;CPAP ?FiO2 (%):  [40 %] 40 % ?Set Rate:  [15 bmp] 15 bmp ?Vt Set:  [410 mL] 410 mL ?PEEP:  [5 cmH20] 5 cmH20 ?Pressure Support:  [8 cmH20-10 cmH20] 10 cmH20 ?Plateau Pressure:  [7 cmH20-15 cmH20] 15 cmH20  ? ?Intake/Output Summary (Last 24 hours) at 10/15/2021 0824 ?Last data filed at 10/15/2021 0740 ?Gross per 24 hour  ?Intake 1400.07 ml  ?Output 3300 ml  ?Net -1899.93 ml  ? ?Filed Weights  ? 10/13/21 0915 10/14/21 0433 10/15/21 0630  ?Weight: 81.7 kg 81.2 kg 80.4 kg  ? ?Physical Exam: ?General: Chronically ill-appearing, ?HEENT: Endotracheal tube in place, moist oral mucosa ?Neuro: Nystagmus persists, downward gaze ?CV: S1-S2 appreciated ?PULM: Clear breath sounds anteriorly ?GI: soft, active bowel sounds  ?Extremities: Skin is warm/dry ?Skin: Erythema, dorsum of left hand ?Neurologically-breathing over the vent, does not cough reflex ? ?BMP-creatinine 1.6 ?CBC significant for chronic anemia ? ?Resolved Hospital Problem list   ? ? ?Assessment & Plan:  ? ?PRES syndrome with associated seizures ?Metabolic encephalopathy secondary to PRES ?-MRI on 3/30 consistent with pres ?-EEG negative for seizures ?-CT with no evidence of stroke or active acute findings ?-Most recent MRI 4/8 with no significant findings ?-Ativan as needed for seizures-has not needed any, on Provigil-no significant changes in mental status as of yet ?-Continue seizure precautions ? ?Acute respiratory failure secondary to probable aspiration pneumonia ?Respiratory insufficiency in the setting of encephalopathy ?-She does tolerate pressure support ?-VAP prevention in place ?-Mental status remains  a barrier to extubation ?-Will require tracheostomy ? ?End-stage renal disease on hemodialysis ?-Continue intermittent hemodialysis ?-Replace electrolytes, trend ? ?History of hypertension, chronic heart failure with reduced ejection fraction, coronary artery disease, hyperlipidemia ?-On carvedilol, hydralazine, aspirin, statin, Zetia ? ?Type 2  diabetes ?-Continue SSI ? ?Hypothyroidism ?-Continue home Synthroid ? ?Sacral pressure ulcer ?-Continue wound care ?Best Practice (right click and "Reselect all SmartList Selections" daily)  ? ?Diet/type: tubefeeds ?DVT prophylaxis: prophylactic heparin  ?GI prophylaxis: PPI ?Lines: N/A ?Foley:  N/A ?Code Status:  full code ? ?Last date of multidisciplinary goals of care discussion [3/31] Updated daughter, Melanie Hall, on patient's critical condition. Remains full code. Ongoing goals of care discussions with family.  At this point looks like this would be a prolonged recovery with unknown residual deficits.  Anticipate that she would likely not be able to live independently in the community in the future without significant assistance.  ?4/5 see separate IPAL note; continue current care; full code. Family wants a few more days to see if she will wake up first. Will readdress trach vs. Comfort care early next week. ?4/6 called daughter Melanie Hall and gave update over phone  ?4/7 will reach back out to family for update on plan  ?4/8 updated daughter at bedside., planning for meeting during the week ?-Continue to discuss possible need for trach.  Patient remains profoundly encephalopathic ? ?The patient is critically ill with multiple organ systems failure and requires high complexity decision making for assessment and support, frequent evaluation and titration of therapies, application of advanced monitoring technologies and extensive interpretation of multiple databases. Critical Care Time devoted to patient care services described in this note independent of APP/resident time (if applicable)  is 31 minutes.  ? ?Sherrilyn Rist MD ?[facility] ?Personal pager: See Shea Evans ?If unanswered, please page ?CCM On-call: [phone] ?

## 2021-10-16 DIAGNOSIS — N186 End stage renal disease: Secondary | ICD-10-CM | POA: Diagnosis not present

## 2021-10-16 DIAGNOSIS — J9600 Acute respiratory failure, unspecified whether with hypoxia or hypercapnia: Secondary | ICD-10-CM | POA: Diagnosis not present

## 2021-10-16 DIAGNOSIS — I6783 Posterior reversible encephalopathy syndrome: Secondary | ICD-10-CM | POA: Diagnosis not present

## 2021-10-16 DIAGNOSIS — R4182 Altered mental status, unspecified: Secondary | ICD-10-CM | POA: Diagnosis not present

## 2021-10-16 LAB — CBC
HCT: 24.8 % — ABNORMAL LOW (ref 36.0–46.0)
Hemoglobin: 8.1 g/dL — ABNORMAL LOW (ref 12.0–15.0)
MCH: 33.3 pg (ref 26.0–34.0)
MCHC: 32.7 g/dL (ref 30.0–36.0)
MCV: 102.1 fL — ABNORMAL HIGH (ref 80.0–100.0)
Platelets: 198 10*3/uL (ref 150–400)
RBC: 2.43 MIL/uL — ABNORMAL LOW (ref 3.87–5.11)
RDW: 16.2 % — ABNORMAL HIGH (ref 11.5–15.5)
WBC: 12.4 10*3/uL — ABNORMAL HIGH (ref 4.0–10.5)
nRBC: 0 % (ref 0.0–0.2)

## 2021-10-16 LAB — GLUCOSE, CAPILLARY
Glucose-Capillary: 217 mg/dL — ABNORMAL HIGH (ref 70–99)
Glucose-Capillary: 169 mg/dL — ABNORMAL HIGH (ref 70–99)
Glucose-Capillary: 107 mg/dL — ABNORMAL HIGH (ref 70–99)
Glucose-Capillary: 149 mg/dL — ABNORMAL HIGH (ref 70–99)
Glucose-Capillary: 149 mg/dL — ABNORMAL HIGH (ref 70–99)
Glucose-Capillary: 167 mg/dL — ABNORMAL HIGH (ref 70–99)

## 2021-10-16 LAB — BASIC METABOLIC PANEL
Anion gap: 15 (ref 5–15)
BUN: 57 mg/dL — ABNORMAL HIGH (ref 6–20)
CO2: 24 mmol/L (ref 22–32)
Calcium: 8.4 mg/dL — ABNORMAL LOW (ref 8.9–10.3)
Chloride: 95 mmol/L — ABNORMAL LOW (ref 98–111)
Creatinine, Ser: 3.98 mg/dL — ABNORMAL HIGH (ref 0.44–1.00)
GFR, Estimated: 13 mL/min — ABNORMAL LOW (ref >60)
Glucose, Bld: 225 mg/dL — ABNORMAL HIGH (ref 70–99)
Potassium: 4.8 mmol/L (ref 3.5–5.1)
Sodium: 134 mmol/L — ABNORMAL LOW (ref 135–145)

## 2021-10-16 LAB — BENZODIAZEPINES,MS,WB/SP RFX
7-Aminoclonazepam: NEGATIVE ng/mL
Alprazolam: NEGATIVE ng/mL
Benzodiazepines Confirm: POSITIVE
Chlordiazepoxide: NEGATIVE
Clonazepam: NEGATIVE ng/mL
Desalkylflurazepam: NEGATIVE ng/mL
Desmethylchlordiazepoxide: NEGATIVE
Desmethyldiazepam: NEGATIVE ng/mL
Diazepam: NEGATIVE ng/mL
Flurazepam: NEGATIVE ng/mL
Lorazepam: NEGATIVE ng/mL
Midazolam: 14.1 ng/mL
Oxazepam: NEGATIVE ng/mL
Temazepam: NEGATIVE ng/mL
Triazolam: NEGATIVE ng/mL

## 2021-10-16 LAB — MAGNESIUM: Magnesium: 1.9 mg/dL (ref 1.7–2.4)

## 2021-10-16 LAB — PHOSPHORUS: Phosphorus: 6.4 mg/dL — ABNORMAL HIGH (ref 2.5–4.6)

## 2021-10-16 MED ORDER — DARBEPOETIN ALFA 150 MCG/0.3ML IJ SOSY
150.0000 ug | PREFILLED_SYRINGE | INTRAMUSCULAR | Status: DC
  Administered 2021-10-20 – 2021-10-26 (×2): 150 ug via INTRAVENOUS
  Filled 2021-10-16 (×5): qty 0.3

## 2021-10-16 MED ORDER — CHOLESTYRAMINE 4 G PO PACK
4.0000 g | PACK | Freq: Two times a day (BID) | ORAL | Status: DC
  Administered 2021-10-16: 4 g
  Filled 2021-10-16 (×3): qty 1

## 2021-10-16 MED ORDER — PIPERACILLIN-TAZOBACTAM IN DEX 2-0.25 GM/50ML IV SOLN
2.2500 g | Freq: Three times a day (TID) | INTRAVENOUS | Status: DC
  Administered 2021-10-16 – 2021-10-18 (×7): 2.25 g via INTRAVENOUS
  Filled 2021-10-16 (×8): qty 50

## 2021-10-16 MED ORDER — INSULIN DETEMIR 100 UNIT/ML ~~LOC~~ SOLN
14.0000 [IU] | Freq: Two times a day (BID) | SUBCUTANEOUS | Status: DC
  Administered 2021-10-16 – 2021-10-18 (×6): 14 [IU] via SUBCUTANEOUS
  Filled 2021-10-16 (×7): qty 0.14

## 2021-10-16 NOTE — Progress Notes (Signed)
Patient's rectal tube came out today. She is continuing to have loose diarrhea. Upon assessment of her anus and rectal area, this RN found a pressure injury. See wound documentation under ICU assessment.  ?

## 2021-10-16 NOTE — Progress Notes (Signed)
Pharmacy Antibiotic Note ? ?Melanie Hall is a 51 y.o. female admitted on 10/01/2021 with aspiration pneumonia.  Pharmacy has been consulted for Zosyn dosing. ? ?iHD patient ? ?Plan: ?Zosyn 2.25gm q8hr ?Will monitor for acute changes in renal function and adjust as needed ?F/u cultures results and de-escalate as appropriate ? ? ?Height: '5\' 3"'$  (160 cm) ?Weight: 79.9 kg (176 lb 2.4 oz) ?IBW/kg (Calculated) : 52.4 ? ?Temp (24hrs), Avg:100.1 ?F (37.8 ?C), Min:98.6 ?F (37 ?C), Max:101.6 ?F (38.7 ?C) ? ?Recent Labs  ?Lab 10/12/21 ?0218 10/13/21 ?0130 10/14/21 ?5035 10/15/21 ?4656 10/16/21 ?0310  ?WBC 9.9 9.4 10.2 10.7* 12.4*  ?CREATININE 5.32* 6.60* 3.97* 1.69* 3.98*  ?  ?Estimated Creatinine Clearance: 16.7 mL/min (A) (by C-G formula based on SCr of 3.98 mg/dL (H)).   ? ?Allergies  ?Allergen Reactions  ? Contrast Media [Iodinated Contrast Media] Nausea And Vomiting  ?  Treated with Benadryl & Solumedrol  ? Penicillins Other (See Comments)  ?  Don't want to take PCN due to family history   ? ? ?Thank you for allowing pharmacy to be a part of this patient?s care. ? ?Donnald Garre, PharmD ?Clinical Pharmacist ? ?Please check AMION for all [place] numbers ?After 10:00 PM, call [facility] [phone] ? ? ?

## 2021-10-16 NOTE — Progress Notes (Signed)
?[facility] ?Progress Note  ? ?Assessment/ Plan:   ?OP HD: TTS [facility] ?  3h 45min  75.5kg  2/2.5 bath  400 bfr   Hep none LUE AVF ? - 3/30 here > hep B Ag negative and hep B Ab's low/ not protective ? - mircera 200 q2 wks ? - venofer 50 weekly ? - rocaltrol 0.25 tiw ?  ?CXR 3/30 - no active disease ?  ?Assessment/ Plan:  ?AMS - found unresponsive at home. Had PRES and seizures. MS remains poor but slowly improving. Neurology following.  ?VDRF- for airway protection ?ESRD - on TTS [facility] [place]. Saturday HD, next 10/17/21 ?Volume - up 5-6 kg peak here. Increased UF w/ last 2 HD sessions, shoot for 74kg range this week as probably has lost body wt.  ?DM2 - per pmd ?Possible sepsis - sp short course IV abx. BCx x2 NGTD ?Anemia - Hb 8-9s. Was on esa/ IV fe at OP unit. Cont darbe 150 ug q Friday and weekly IV Fe here.  ?CKD-BMM: CCa stable. Cont vdra tiw.  ?Nutrition: getting TF"s at 50 cc/hr ? ?Subjective:   ? ?Seen for followup.  Remains intubated but appears to be waking up- responds to her name.    ? ?Objective:   ?BP 105/75 (BP Location: Right Arm)   Pulse 83   Temp 99 ?F (37.2 ?C) (Axillary)   Resp (!) 22   Ht 5' 3" (1.6 m)   Wt 79.9 kg   LMP 12/17/2016   SpO2 99%   BMI 31.20 kg/m?  ? ?Physical Exam: ?Gen:intubated, in restraints ?CVS: RRR ?Resp: clear anteriorly ?Abd: soft ?Ext: no LE edema ?ACCESS: + T/B AVF ?Labs: ?BMET ?Recent Labs  ?Lab 10/10/21 ?0645 10/11/21 ?0318 10/12/21 ?0218 10/13/21 ?0130 10/14/21 ?0609 10/15/21 ?0605 10/16/21 ?0310  ?NA 141 135 137 136 138 136 134*  ?K 4.5 4.6 5.0 5.7* 5.1 3.5 4.8  ?CL 103 99 101 100 99 97* 95*  ?CO2 22 26 23 21* 28 28 24  ?GLUCOSE 156* 251* 120* 134* 104* 69* 225*  ?BUN 70* 39* 66* 95* 50* 19 57*  ?CREATININE 6.41* 3.92* 5.32* 6.60* 3.97* 1.69* 3.98*  ?CALCIUM 8.5* 8.5* 8.6* 8.4* 8.6* 9.0 8.4*  ?PHOS  --   --   --   --  6.2* 2.4* 6.4*  ? ?CBC ?Recent Labs  ?Lab 10/13/21 ?0130 10/14/21 ?0609 10/15/21 ?0605 10/16/21 ?0310  ?WBC 9.4  10.2 10.7* 12.4*  ?HGB 7.6* 8.1* 8.4* 8.1*  ?HCT 23.9* 25.3* 26.1* 24.8*  ?MCV 104.4* 103.7* 100.8* 102.1*  ?PLT 145* 175 202 198  ? ? ?  ?Medications:   ? ? aspirin  81 mg Per Tube Daily  ? atorvastatin  80 mg Per Tube q1800  ? calcitRIOL  0.25 mcg Per Tube Q T,Th,Sat-1800  ? carvedilol  25 mg Per Tube BID WC  ? chlorhexidine gluconate (MEDLINE KIT)  15 mL Mouth Rinse BID  ? Chlorhexidine Gluconate Cloth  6 each Topical Daily  ? [START ON 10/19/2021] darbepoetin (ARANESP) injection - DIALYSIS  150 mcg Intravenous Q Thu-HD  ? ezetimibe  10 mg Per Tube Daily  ? feeding supplement (PROSource TF)  45 mL Per Tube QID  ? fiber  1 packet Per Tube TID  ? folic acid  1 mg Per Tube Daily  ? heparin injection (subcutaneous)  5,000 Units Subcutaneous Q8H  ? hydrALAZINE  10 mg Per Tube BID  ? insulin aspart  0-20 Units Subcutaneous Q4H  ? insulin aspart    4 Units Subcutaneous Q4H  ? insulin detemir  14 Units Subcutaneous Q12H  ? lacosamide  75 mg Per Tube BID  ? leptospermum manuka honey  1 application. Topical UD  ? levothyroxine  50 mcg Per Tube QAC breakfast  ? mouth rinse  15 mL Mouth Rinse 10 times per day  ? modafinil  100 mg Per Tube Daily  ? multivitamin  1 tablet Per Tube QHS  ? pantoprazole sodium  40 mg Per Tube Daily  ? thiamine  100 mg Per Tube Daily  ? vitamin B-12  1,000 mcg Per Tube Daily  ? ? ? ?Madelon Lips, MD ?10/16/2021, 1:12 PM   ?

## 2021-10-16 NOTE — Progress Notes (Signed)
Patient continue to be intubated. Possible extubation tomorrow. No TOC Needs or recommendations noted at this time. CM will continue to follow with needs. ?

## 2021-10-16 NOTE — Progress Notes (Signed)
eLink Physician-Brief Progress Note ?Patient Name: Melanie Hall ?DOB: Mar 15, 1971 ?MRN: [id number] ? ? ?Date of Service ? 10/16/2021  ?HPI/Events of Note ? Multiple issues: 1. Loose stools - Nursing request for Imodium. 2. Agitation - Nursing request to renew bilateral soft wrist restraints.   ?eICU Interventions ? Plan: ?Questran 4 gm per tube now and BID. ?Will renew bilateral soft wrist restraints X 13 hours.   ? ? ? ?Intervention Category ?Major Interventions: Delirium, psychosis, severe agitation - evaluation and management;Other: ? ?[name] ?10/16/2021, 8:40 PM ?

## 2021-10-16 NOTE — Progress Notes (Signed)
? ?NAME:  Melanie Hall, MRN:  [id number], DOB:  October 26, 1970, LOS: 12 ?ADMISSION DATE:  09/30/2021, CONSULTATION DATE:  10/06/2021 ?REFERRING MD:  Dr. Roderic Palau, ER, CHIEF COMPLAINT:  AMS  ? ?History of Present Illness:  ?51 yo female former smoker with hx of ESRD on HD was found at her home by patient transport driver unresponsive.  Pt's neighbor noted that she was tapping her hands for about a minute, and then stopped.  She has snoring and drooling.  No improvement after narcan.  CBG normal.  Noted to have BP 215/96.  Intubated in [facility] for airway protection, given keppra load, started on diprivan, and started on antibiotics.  CT head negative for acute findings.  Treated for hyperkalemia.  Pt's daughter report she had similar episode on 09/24/21 and EMS was called to the house, but episode resolve and patient declined hospital evaluation.  No prior history of seizures or known head injury.  ER team d/w case with neurology and advised to transfer to [facility] for EEG monitoring and further neurology assessment. ? ?Hx from medical team, patients daughter at bedside, and medical records. ? ?Pertinent  Medical History  ?CHF, Anemia, OA, CAD, DM type 2, ESRD on HD, HLD, HTN, CVA ? ?Significant Hospital Events: ?Including procedures, antibiotic start and stop dates in addition to other pertinent events   ?3/29 present to [facility], intubated, transfer to [facility] ?3/30 MRI Brain consistent with PRES ?4/3 off sedation, not waking up ?4/4 transitioned off insulin gtt to SSI ?4/5 [place] conversation; see IPAL note ?4/8 MRI with no acute findings ? ?Interim History / Subjective:  ?Patient continued to spike fever with Tmax 101.6 ?She has copious amount of yellowish respiratory secretions ? ?Objective   ?Blood pressure 131/68, pulse 86, temperature 98.6 ?F (37 ?C), temperature source Oral, resp. rate 20, height '5\' 3"'$  (1.6 m), weight 79.9 kg, last menstrual period 12/17/2016, SpO2 100 %. ?   ?Vent Mode: PRVC ?FiO2 (%):  [40 %] 40 % ?Set Rate:  [15 bmp]  15 bmp ?Vt Set:  [410 mL] 410 mL ?PEEP:  [5 cmH20] 5 cmH20 ?Pressure Support:  [10 cmH20] 10 cmH20 ?Plateau Pressure:  [12 cmH20-17 cmH20] 12 cmH20  ? ?Intake/Output Summary (Last 24 hours) at 10/16/2021 0748 ?Last data filed at 10/16/2021 0600 ?Gross per 24 hour  ?Intake 1890 ml  ?Output 100 ml  ?Net 1790 ml  ? ?Filed Weights  ? 10/14/21 0433 10/15/21 0630 10/16/21 0339  ?Weight: 81.2 kg 80.4 kg 79.9 kg  ? ?Physical Exam: ?General: Crtitically ill-appearing female, orally intubated ?HEENT: [place]/AT, eyes anicteric.  ETT and OGT in place ?Neuro: Eyes closed, does not open, withdrawing in all 4 extremities with painful stimuli, positive cough and gag ?Chest: Coarse breath sounds, no wheezes or rhonchi ?Heart: Regular rate and rhythm, no murmurs or gallops ?Abdomen: Soft, nontender, nondistended, bowel sounds present ?Skin: No rash ? ?Resolved Hospital Problem list   ? ? ?Assessment & Plan:  ? ?PRES syndrome with associated seizures ?Acute Metabolic encephalopathy secondary to PRES ?MRI on 3/30 consistent with PRES, repeat MRI on 4 8 showed resolution of PRES ?EEG has been negative for seizures ?Patient remained encephalopathic, still not waking up ?Continue Provigil ?Continue seizure precautions ? ?Acute respiratory failure secondary to probable aspiration pneumonia ?Patient continued to spike fever ?We will send respiratory culture ?Started on IV Zosyn ?Continue lung protective ventilation  ?VAP prevention in place ?Mental status remains a barrier to extubation ? ?End-stage renal disease on hemodialysis ?Hyponatremia ?Hyperphosphatemia ?Continue intermittent hemodialysis ?  Replace electrolytes, trend ? ?Chronic HFrEF  ?Coronary artery disease/hyperlipidemia ?Continue carvedilol, hydralazine, aspirin, statin, Zetia ? ?Type 2 diabetes with hyperglycemia ?Blood sugars are not well controlled ?Increase Lantus, continue SSI ? ?Hypothyroidism ?Continue home Synthroid ? ?Unstageable sacral pressure ulcer, POA ?Continue wound  care ? ?Best Practice (right click and "Reselect all SmartList Selections" daily)  ? ?Diet/type: tubefeeds ?DVT prophylaxis: prophylactic heparin  ?GI prophylaxis: PPI ?Lines: N/A ?Foley:  N/A ?Code Status:  full code ? ?Last date of multidisciplinary goals of care discussion [4/5 : Please see plan of care note  ? ?Total critical care time: 38 minutes ? ?Performed by: Jacky Kindle ?  ?Critical care time was exclusive of separately billable procedures and treating other patients. ?  ?Critical care was necessary to treat or prevent imminent or life-threatening deterioration. ?  ?Critical care was time spent personally by me on the following activities: development of treatment plan with patient and/or surrogate as well as nursing, discussions with consultants, evaluation of patient's response to treatment, examination of patient, obtaining history from patient or surrogate, ordering and performing treatments and interventions, ordering and review of laboratory studies, ordering and review of radiographic studies, pulse oximetry and re-evaluation of patient's condition. ?  ?Jacky Kindle MD ?[facility] ?See Amion for pager ?If no response to pager, please call [phone] until 7pm ?After 7pm, Please call E-link [phone] ? ?

## 2021-10-16 NOTE — Progress Notes (Signed)
Subjective: No acute events overnight.  Patient continues to be encephalopathic. Per RN, patient does open her eyes but does not track examiner, does not follow commands. ? ?ROS: Unable to obtain due to poor mental status ? ?Examination ? ?Vital signs in last 24 hours: ?Temp:  [98.6 ?F (37 ?C)-101.6 ?F (38.7 ?C)] 100.4 ?F (38 ?C) (04/10 5465) ?Pulse Rate:  [78-97] 80 (04/10 0806) ?Resp:  [5-31] 14 (04/10 0806) ?BP: (95-138)/(38-96) 124/70 (04/10 0800) ?SpO2:  [98 %-100 %] 100 % (04/10 0806) ?FiO2 (%):  [40 %] 40 % (04/10 0800) ?Weight:  [79.9 kg] 79.9 kg (04/10 0339) ? ?General: lying in bed, NAD ?Neuro: winces to noxious stimulation but doesn't open eyes or follow commands, pupils sluggishly reacting to light, no forced gaze deviation, roving eye movements with downward gaze preference, withdraws to noxious stimuli in all extremities  ?  ?Basic Metabolic Panel: ?Recent Labs  ?Lab 10/10/21 ?0645 10/11/21 ?6812 10/12/21 ?7517 10/13/21 ?0130 10/14/21 ?0017 10/15/21 ?4944 10/16/21 ?0310  ?NA 141   < > 137 136 138 136 134*  ?K 4.5   < > 5.0 5.7* 5.1 3.5 4.8  ?CL 103   < > 101 100 99 97* 95*  ?CO2 22   < > 23 21* '28 28 24  '$ ?GLUCOSE 156*   < > 120* 134* 104* 69* 225*  ?BUN 70*   < > 66* 95* 50* 19 57*  ?CREATININE 6.41*   < > 5.32* 6.60* 3.97* 1.69* 3.98*  ?CALCIUM 8.5*   < > 8.6* 8.4* 8.6* 9.0 8.4*  ?MG 2.1  --   --   --  1.8 1.9 1.9  ?PHOS  --   --   --   --  6.2* 2.4* 6.4*  ? < > = values in this interval not displayed.  ? ? ?CBC: ?Recent Labs  ?Lab 10/12/21 ?0218 10/13/21 ?0130 10/14/21 ?9675 10/15/21 ?9163 10/16/21 ?0310  ?WBC 9.9 9.4 10.2 10.7* 12.4*  ?HGB 8.3* 7.6* 8.1* 8.4* 8.1*  ?HCT 25.5* 23.9* 25.3* 26.1* 24.8*  ?MCV 105.4* 104.4* 103.7* 100.8* 102.1*  ?PLT 155 145* 175 202 198  ? ? ? ?Coagulation Studies: ?No results for input(s): LABPROT, INR in the last 72 hours. ? ?Imaging ? ?MRI brain without contrast 10/14/2021: No acute intracranial abnormality. Generalized volume loss without a clear lobar  predilection. ? ? ?ASSESSMENT AND PLAN: 51 year old female with seizures and altered mental status in the setting of systolic blood pressure 846 which has since improved. ?  ?Seizure ?Posterior reversible encephalopathy syndrome (PRES) ?Acute encephalopathy  ?Leukocytosis ?Fever ?Anemia ?Hyponatremia ?Diabetes ?End-stage renal disease ?-Patient has been off sedation for more than 5 days but continues to be encephalopathic.  When she does open her eyes, she does not track examiner and does not follow commands.  She is febrile today, ICU team is  suspecting respiratory infection.  No other causes of persistent encephalopathy ?  ?Recommendations ?-Continue vimpat 75 mg BID ?- Discontinue Keppra to minimize sedation ?-Continue provigil '100mg'$  daily to promote wakefulness ?- Patient presented with status epilepticus likely due to PRES. However, seizures have been controlled, BP has been controlled and patient has been off sedation for over 5 days but continues to be comatose.  ICU team is meeting with family today to discuss possible tracheostomy. ?-Continue seizure precautions ?-As needed IV Ativan 2 mg for clinical seizure-like activity ?-Management of rest of comorbidities per primary team ?- Plan discussed with critical care team ?  ?I have spent a total of 22mnutes with  the patient reviewing hospital notes,  test results, labs and examining the patient as well as establishing an assessment and plan. > 50% of time was spent in direct patient care. ? ?Zeb Comfort ?Epilepsy ?[facility] ?For questions after 5pm please refer to AMION to reach the Neurologist on call ? ?

## 2021-10-17 ENCOUNTER — Inpatient Hospital Stay (HOSPITAL_COMMUNITY): Payer: Medicaid Other

## 2021-10-17 DIAGNOSIS — I6783 Posterior reversible encephalopathy syndrome: Secondary | ICD-10-CM | POA: Diagnosis not present

## 2021-10-17 DIAGNOSIS — N186 End stage renal disease: Secondary | ICD-10-CM | POA: Diagnosis not present

## 2021-10-17 DIAGNOSIS — J9601 Acute respiratory failure with hypoxia: Secondary | ICD-10-CM | POA: Diagnosis not present

## 2021-10-17 DIAGNOSIS — J9600 Acute respiratory failure, unspecified whether with hypoxia or hypercapnia: Secondary | ICD-10-CM | POA: Diagnosis not present

## 2021-10-17 DIAGNOSIS — R4182 Altered mental status, unspecified: Secondary | ICD-10-CM | POA: Diagnosis not present

## 2021-10-17 DIAGNOSIS — R0489 Hemorrhage from other sites in respiratory passages: Secondary | ICD-10-CM

## 2021-10-17 LAB — GLUCOSE, CAPILLARY
Glucose-Capillary: 127 mg/dL — ABNORMAL HIGH (ref 70–99)
Glucose-Capillary: 194 mg/dL — ABNORMAL HIGH (ref 70–99)
Glucose-Capillary: 104 mg/dL — ABNORMAL HIGH (ref 70–99)
Glucose-Capillary: 139 mg/dL — ABNORMAL HIGH (ref 70–99)
Glucose-Capillary: 240 mg/dL — ABNORMAL HIGH (ref 70–99)
Glucose-Capillary: 178 mg/dL — ABNORMAL HIGH (ref 70–99)

## 2021-10-17 LAB — BASIC METABOLIC PANEL
Anion gap: 16 — ABNORMAL HIGH (ref 5–15)
BUN: 87 mg/dL — ABNORMAL HIGH (ref 6–20)
CO2: 22 mmol/L (ref 22–32)
Calcium: 8.2 mg/dL — ABNORMAL LOW (ref 8.9–10.3)
Chloride: 95 mmol/L — ABNORMAL LOW (ref 98–111)
Creatinine, Ser: 5.46 mg/dL — ABNORMAL HIGH (ref 0.44–1.00)
GFR, Estimated: 9 mL/min — ABNORMAL LOW (ref >60)
Glucose, Bld: 142 mg/dL — ABNORMAL HIGH (ref 70–99)
Potassium: 5.2 mmol/L — ABNORMAL HIGH (ref 3.5–5.1)
Sodium: 133 mmol/L — ABNORMAL LOW (ref 135–145)

## 2021-10-17 LAB — HEMOGLOBIN AND HEMATOCRIT, BLOOD
HCT: 23.4 % — ABNORMAL LOW (ref 36.0–46.0)
Hemoglobin: 7.7 g/dL — ABNORMAL LOW (ref 12.0–15.0)

## 2021-10-17 LAB — CBC
HCT: 20.8 % — ABNORMAL LOW (ref 36.0–46.0)
Hemoglobin: 6.9 g/dL — CL (ref 12.0–15.0)
MCH: 33.7 pg (ref 26.0–34.0)
MCHC: 33.2 g/dL (ref 30.0–36.0)
MCV: 101.5 fL — ABNORMAL HIGH (ref 80.0–100.0)
Platelets: 213 10*3/uL (ref 150–400)
RBC: 2.05 MIL/uL — ABNORMAL LOW (ref 3.87–5.11)
RDW: 16 % — ABNORMAL HIGH (ref 11.5–15.5)
WBC: 11.7 10*3/uL — ABNORMAL HIGH (ref 4.0–10.5)
nRBC: 0 % (ref 0.0–0.2)

## 2021-10-17 LAB — HM DIABETES EYE EXAM

## 2021-10-17 LAB — MAGNESIUM: Magnesium: 2 mg/dL (ref 1.7–2.4)

## 2021-10-17 LAB — PREPARE RBC (CROSSMATCH)

## 2021-10-17 IMAGING — DX DG ABDOMEN 1V
1 series · 1 of 1 positions shown · non-contrast
Comparison: Abdominal x-ray dated [DATE].

CLINICAL DATA: Enteric tube placement.

EXAM:
ABDOMEN - 1 VIEW

[abdomen]
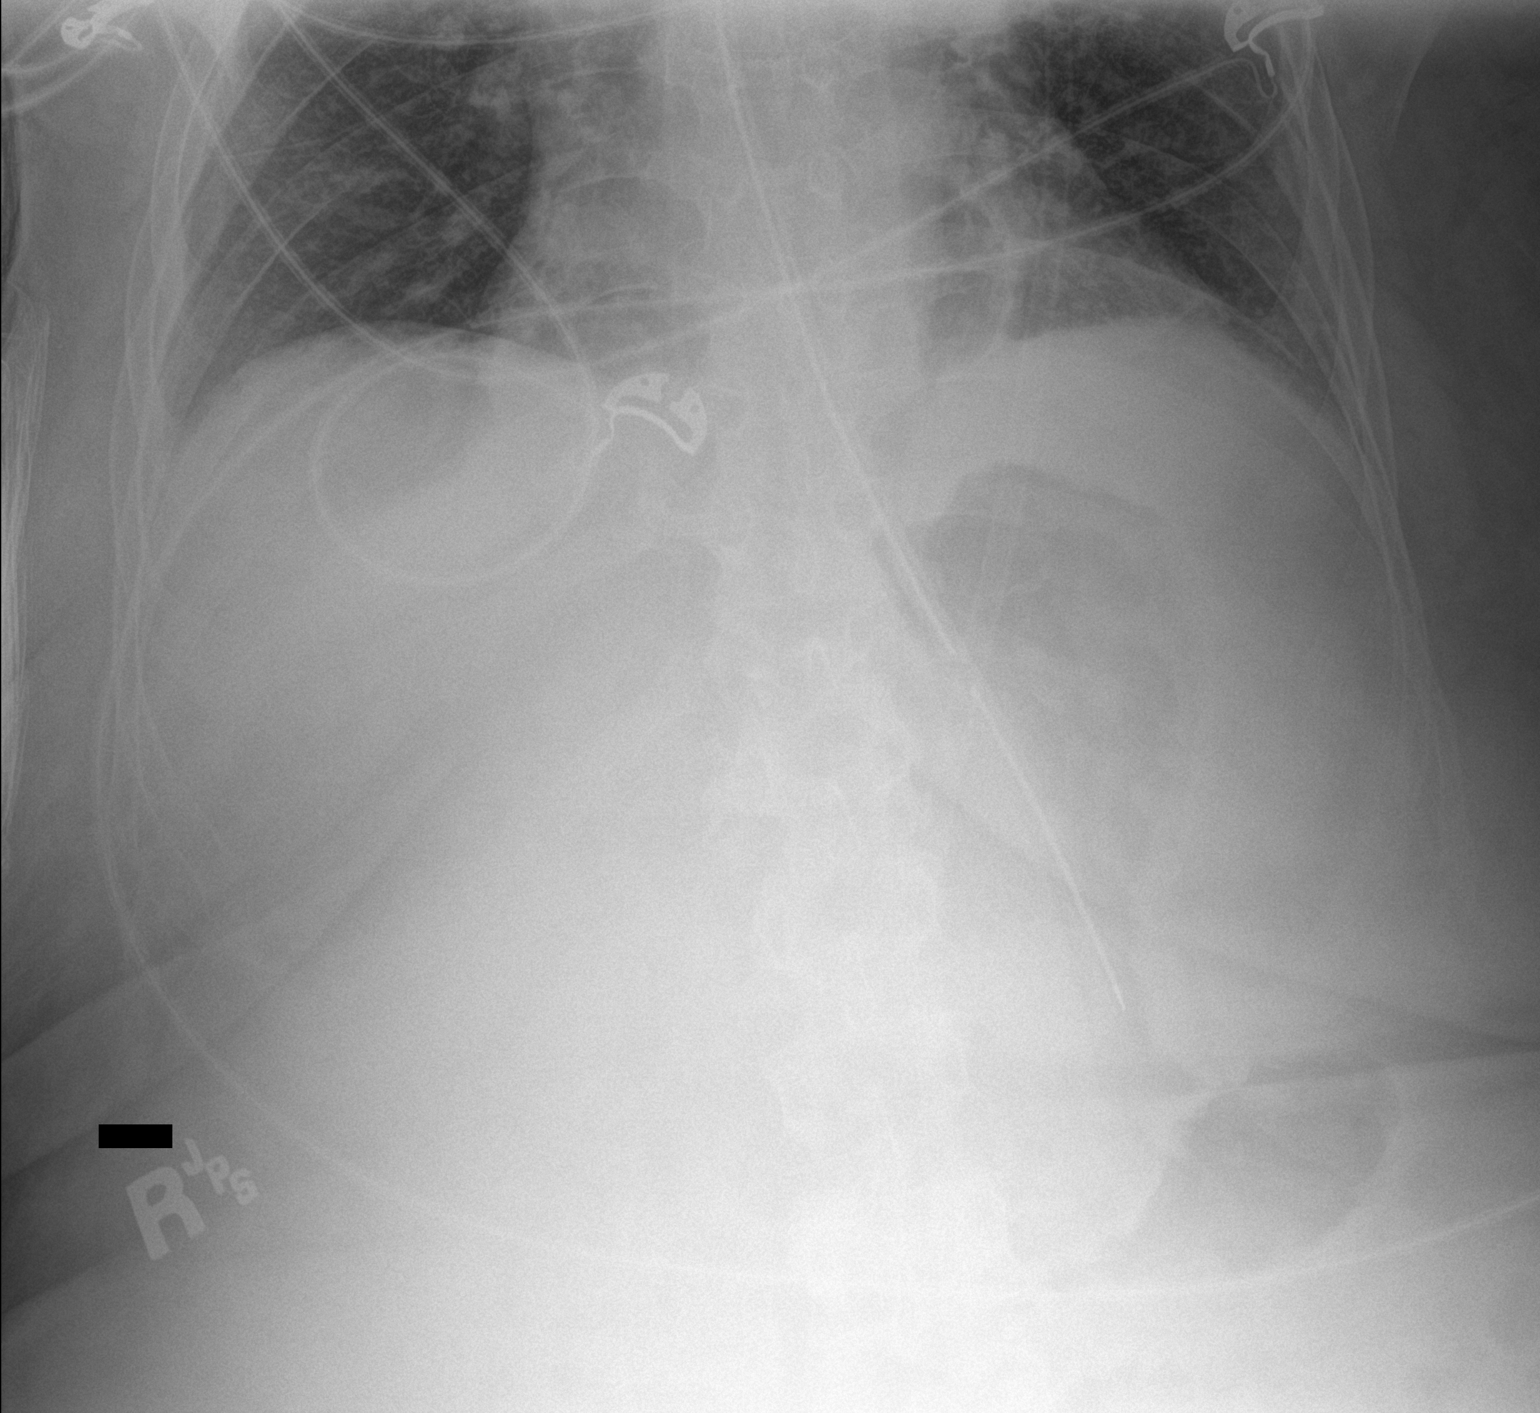

[1 of 1 positions shown; findings below may reference images not displayed]

FINDINGS: Enteric tube appropriately positioned within the stomach. The bowel
gas pattern is normal. No radio-opaque calculi or other significant
radiographic abnormality are seen.
IMPRESSION: 1. Enteric tube in the stomach.

## 2021-10-17 IMAGING — DX DG CHEST 1V PORT
1 series · 1 of 1 positions shown · non-contrast
Comparison: [DATE]

CLINICAL DATA: Endotracheal and OG tube placement.

EXAM:
PORTABLE CHEST 1 VIEW

[chest]
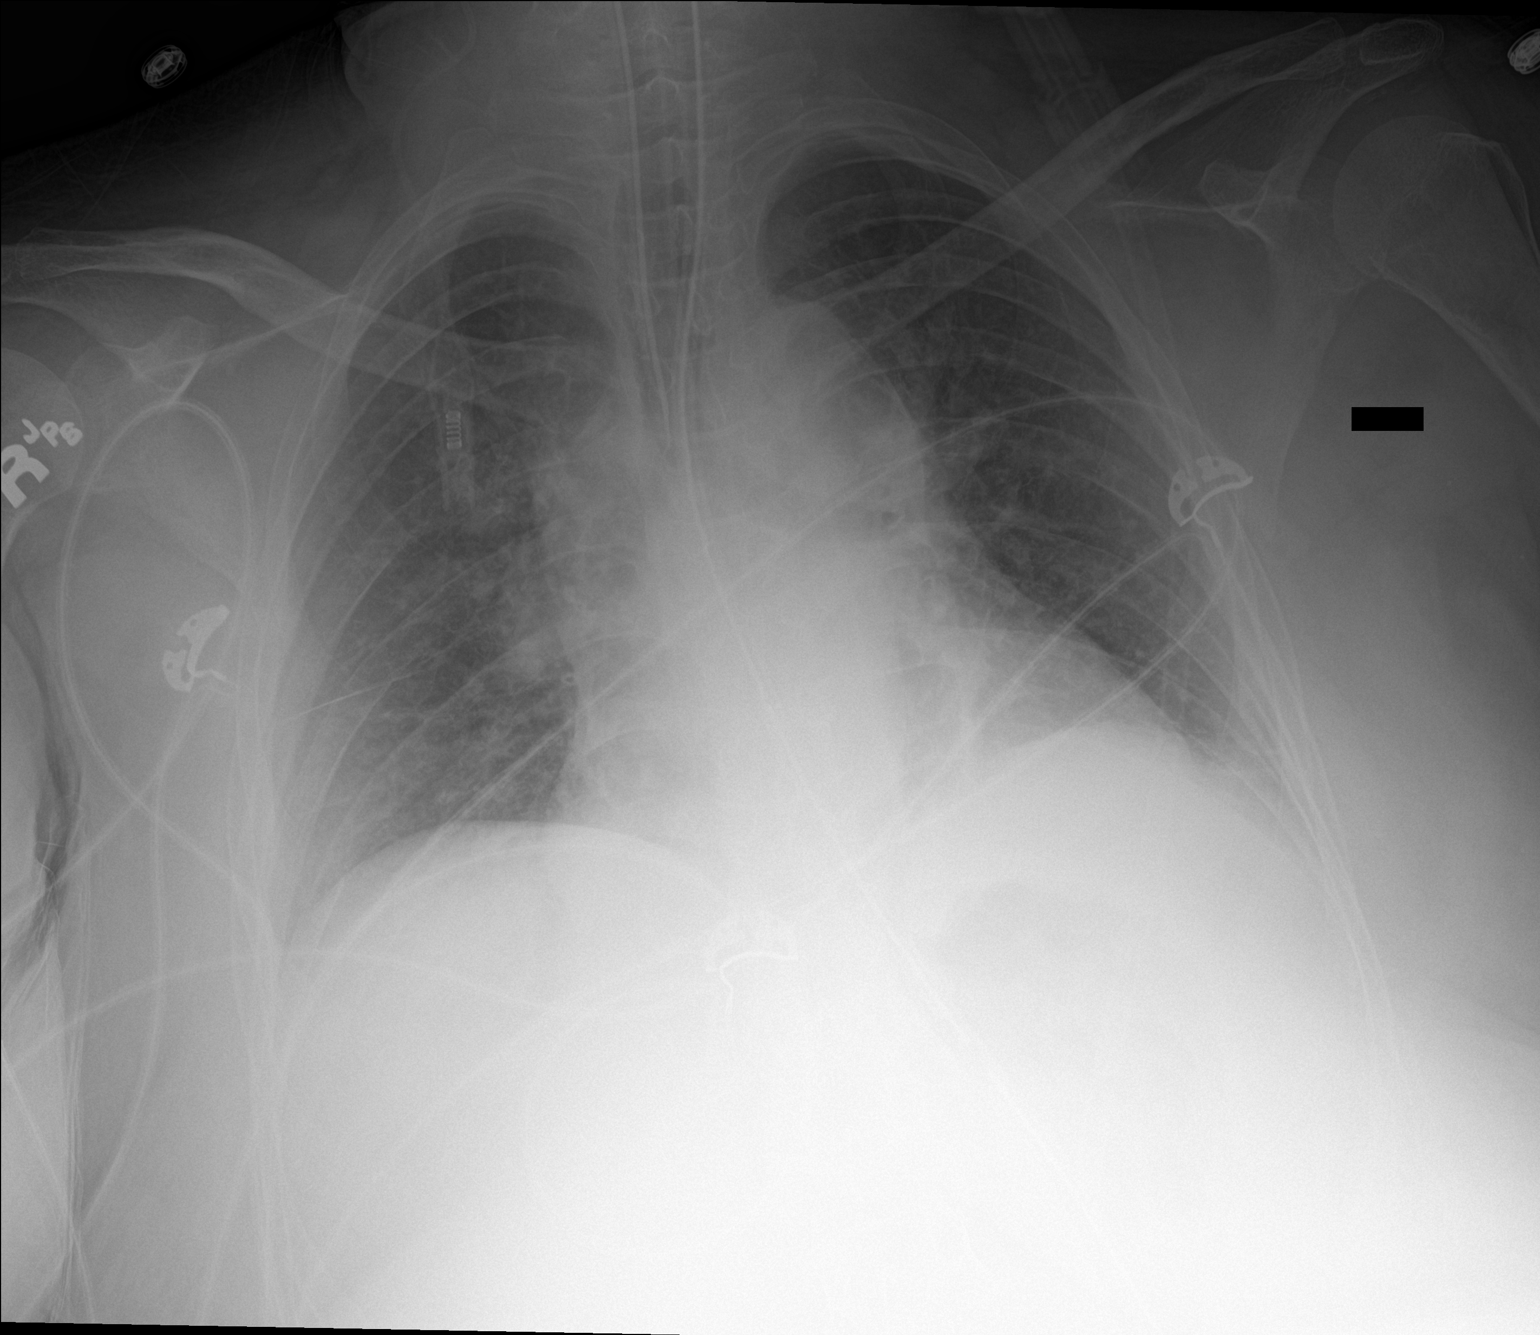

[1 of 1 positions shown; findings below may reference images not displayed]

FINDINGS: [CY] hours. Endotracheal tube tip is 1.2 cm above the base of the
carina. The NG tube passes into the stomach although the distal tip
position is not included on the film. The cardio pericardial
silhouette is enlarged. Interstitial markings are diffusely
coarsened with chronic features. Stable atelectasis or scarring in
the retrocardiac left base. No pleural effusion. Telemetry leads
overlie the chest.
IMPRESSION: Endotracheal tube tip 1.2 cm above the base of the carina. NG tube
passes into the stomach although the distal tip position is not
included on the film.

Cardiomegaly with left base atelectasis or scarring

## 2021-10-17 IMAGING — DX DG CHEST 1V PORT
1 series · 1 of 1 positions shown · non-contrast
Comparison: [DATE], [DATE] p.m.

CLINICAL DATA: 50-year-old female with re-intubation

EXAM:
PORTABLE CHEST 1 VIEW

[chest ap]
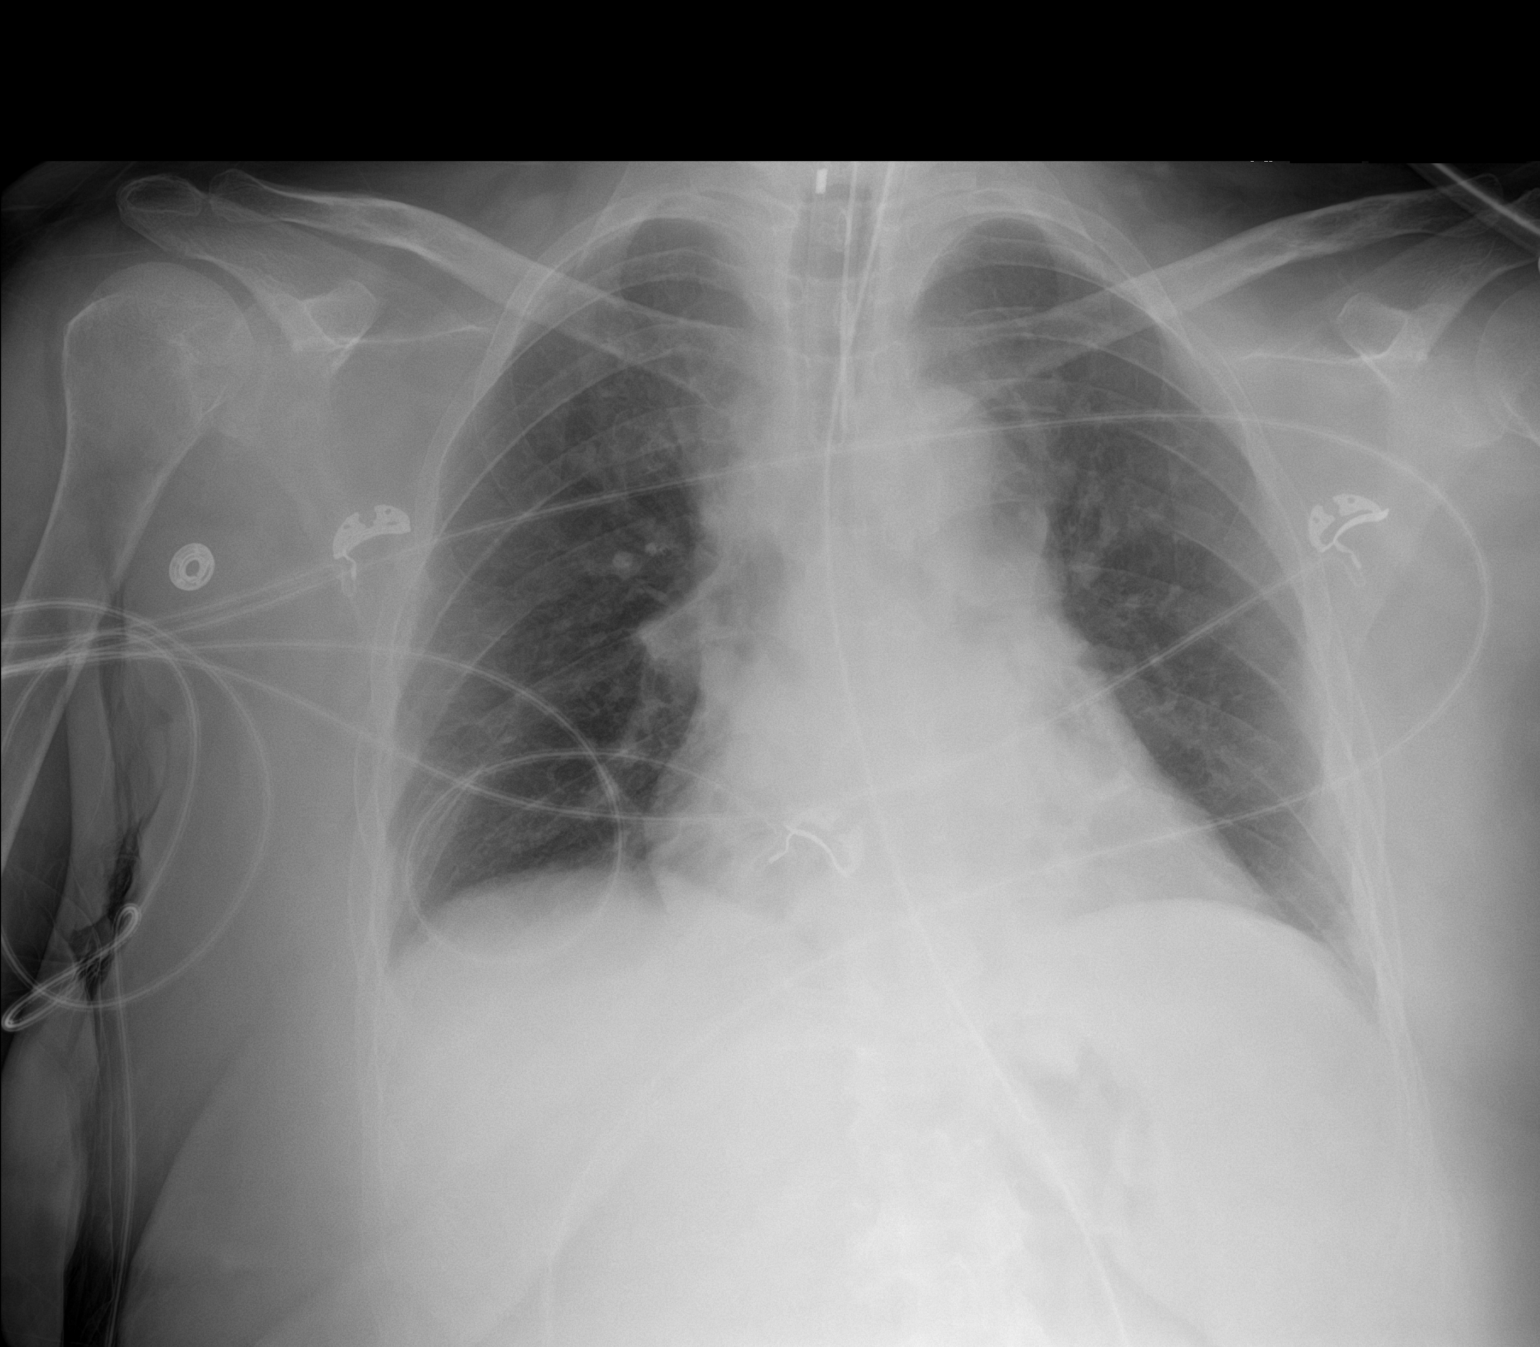

[1 of 1 positions shown; findings below may reference images not displayed]

FINDINGS: Cardiomediastinal silhouette unchanged in size and contour.

Low lung volumes persist. No pneumothorax or pleural effusion. No
new confluent airspace disease.

Interval slight withdrawal of the endotracheal tube terminating
cm above the carina.

Gastric tube projects over the mediastinum terminating out of the
field of view. Side port projects in the region of the stomach in
the upper abdomen.
IMPRESSION: Interval withdrawal of the endotracheal tube, now terminating 2.5 cm
above the carina.

Unchanged gastric tube.

Persistently low lung volumes

## 2021-10-17 MED ORDER — ROCURONIUM BROMIDE 50 MG/5ML IV SOLN
100.0000 mg | Freq: Once | INTRAVENOUS | Status: AC
Start: 2021-10-17 — End: 2021-10-17
  Administered 2021-10-17: 100 mg via INTRAVENOUS

## 2021-10-17 MED ORDER — SODIUM CHLORIDE 0.9% IV SOLUTION: Freq: Once | INTRAVENOUS | Status: DC

## 2021-10-17 MED ORDER — ROCURONIUM BROMIDE 10 MG/ML (PF) SYRINGE
PREFILLED_SYRINGE | INTRAVENOUS | Status: AC
  Filled 2021-10-17: qty 10

## 2021-10-17 MED ORDER — ETOMIDATE 2 MG/ML IV SOLN
INTRAVENOUS | Status: AC
  Filled 2021-10-17: qty 20

## 2021-10-17 MED ORDER — FENTANYL CITRATE (PF) 100 MCG/2ML IJ SOLN
200.0000 ug | Freq: Once | INTRAMUSCULAR | Status: AC
  Administered 2021-10-17 (×2): 100 ug via INTRAVENOUS
  Filled 2021-10-17: qty 4

## 2021-10-17 MED ORDER — NOREPINEPHRINE 4 MG/250ML-% IV SOLN
INTRAVENOUS | Status: AC
  Filled 2021-10-17: qty 250

## 2021-10-17 MED ORDER — INSULIN ASPART 100 UNIT/ML IJ SOLN
0.0000 [IU] | INTRAMUSCULAR | Status: DC
  Administered 2021-10-17: 2 [IU] via SUBCUTANEOUS
  Administered 2021-10-17: 1 [IU] via SUBCUTANEOUS
  Administered 2021-10-18: 5 [IU] via SUBCUTANEOUS
  Administered 2021-10-18: 4 [IU] via SUBCUTANEOUS
  Administered 2021-10-18 (×2): 3 [IU] via SUBCUTANEOUS
  Administered 2021-10-18 (×2): 1 [IU] via SUBCUTANEOUS

## 2021-10-17 MED ORDER — DIPHENHYDRAMINE HCL 50 MG/ML IJ SOLN
50.0000 mg | Freq: Once | INTRAMUSCULAR | Status: AC
  Administered 2021-10-18: 50 mg via INTRAVENOUS
  Filled 2021-10-17: qty 1

## 2021-10-17 MED ORDER — ETOMIDATE 2 MG/ML IV SOLN
20.0000 mg | Freq: Once | INTRAVENOUS | Status: AC
  Administered 2021-10-17: 20 mg via INTRAVENOUS
  Filled 2021-10-17: qty 10

## 2021-10-17 MED ORDER — MIDAZOLAM HCL 2 MG/2ML IJ SOLN
5.0000 mg | Freq: Once | INTRAMUSCULAR | Status: AC
  Administered 2021-10-17: 2 mg via INTRAVENOUS
  Filled 2021-10-17: qty 6

## 2021-10-17 MED ORDER — DIPHENHYDRAMINE HCL 50 MG/ML IJ SOLN: 50.0000 mg | Freq: Once | INTRAMUSCULAR | Status: DC

## 2021-10-17 MED ORDER — PREDNISONE 20 MG PO TABS
50.0000 mg | ORAL_TABLET | Freq: Four times a day (QID) | ORAL | Status: AC
  Administered 2021-10-17 – 2021-10-18 (×3): 50 mg
  Filled 2021-10-17 (×6): qty 1

## 2021-10-17 MED ORDER — NOREPINEPHRINE 4 MG/250ML-% IV SOLN
0.0000 ug/min | INTRAVENOUS | Status: DC
  Administered 2021-10-17: 5 ug/min via INTRAVENOUS

## 2021-10-17 MED ORDER — ETOMIDATE 2 MG/ML IV SOLN
20.0000 mg | Freq: Once | INTRAVENOUS | Status: AC
Start: 2021-10-17 — End: 2021-10-17
  Administered 2021-10-17: 20 mg via INTRAVENOUS

## 2021-10-17 MED ORDER — ROCURONIUM BROMIDE 50 MG/5ML IV SOLN
100.0000 mg | Freq: Once | INTRAVENOUS | Status: AC
  Administered 2021-10-17: 100 mg via INTRAVENOUS
  Filled 2021-10-17: qty 10

## 2021-10-17 MED ORDER — DIPHENHYDRAMINE HCL 25 MG PO CAPS: 50.0000 mg | ORAL_CAPSULE | Freq: Once | ORAL | Status: AC

## 2021-10-17 NOTE — Progress Notes (Signed)
?[facility] ?Progress Note  ? ?Assessment/ Plan:   ?OP HD: TTS [facility] ?  3h 33mn  75.5kg  2/2.5 bath  400 bfr   Hep none LUE AVF ? - 3/30 here > hep B Ag negative and hep B Ab's low/ not protective ? - mircera 200 q2 wks ? - venofer 50 weekly ? - rocaltrol 0.25 tiw ?  ?CXR 3/30 - no active disease ?  ?Assessment/ Plan:  ?AMS - found unresponsive at home. Had PRES and seizures. MS remains poor Neurology following.  ?VDRF- for airway protection.  She hasn't been able to be extubated. Going for trach per notes ?ESRD - on TTS [facility]. Saturday HD, next today 10/17/21 ?Volume - up 5-6 kg peak here. Increased UF as we can ?DM2 - per pmd ?Possible sepsis - sp short course IV abx. BCx x2 NGTD ?Anemia - Hb 8-9s. Was on esa/ IV fe at OP unit. Cont darbe 150 ug q Friday and weekly IV Fe here.  ?CKD-BMM: CCa stable. Cont vdra tiw.  ?Nutrition: getting TF"s at 50 cc/hr ? ?Subjective:   ? ?For HD today. Hgb down to 6.9, getting 1 u pRBCs.    ? ?Objective:   ?BP 121/64   Pulse 81   Temp 100.2 ?F (37.9 ?C) (Oral)   Resp (!) 22   Ht '5\' 3"'  (1.6 m)   Wt 79.9 kg   LMP 12/17/2016   SpO2 100%   BMI 31.20 kg/m?  ? ?Physical Exam: ?GMEB:RAXENMMHW in restraints, agitated ?CVS: RRR ?Resp: clear anteriorly ?Abd: soft ?Ext: no LE edema ?ACCESS: + T/B AVF ?Labs: ?BMET ?Recent Labs  ?Lab 10/11/21 ?0318 10/12/21 ?0218 10/13/21 ?0130 10/14/21 ?0808804/09/23 ?0110304/10/23 ?0310 10/17/21 ?0145  ?NA 135 137 136 138 136 134* 133*  ?K 4.6 5.0 5.7* 5.1 3.5 4.8 5.2*  ?CL 99 101 100 99 97* 95* 95*  ?CO2 26 23 21* '28 28 24 22  ' ?GLUCOSE 251* 120* 134* 104* 69* 225* 142*  ?BUN 39* 66* 95* 50* 19 57* 87*  ?CREATININE 3.92* 5.32* 6.60* 3.97* 1.69* 3.98* 5.46*  ?CALCIUM 8.5* 8.6* 8.4* 8.6* 9.0 8.4* 8.2*  ?PHOS  --   --   --  6.2* 2.4* 6.4*  --   ? ?CBC ?Recent Labs  ?Lab 10/14/21 ?0609 10/15/21 ?0605 10/16/21 ?0310 10/17/21 ?0145  ?WBC 10.2 10.7* 12.4* 11.7*  ?HGB 8.1* 8.4* 8.1* 6.9*  ?HCT 25.3* 26.1* 24.8* 20.8*   ?MCV 103.7* 100.8* 102.1* 101.5*  ?PLT 175 202 198 213  ? ? ?  ?Medications:   ? ? sodium chloride   Intravenous Once  ? aspirin  81 mg Per Tube Daily  ? atorvastatin  80 mg Per Tube q1800  ? calcitRIOL  0.25 mcg Per Tube Q T,Th,Sat-1800  ? carvedilol  25 mg Per Tube BID WC  ? chlorhexidine gluconate (MEDLINE KIT)  15 mL Mouth Rinse BID  ? Chlorhexidine Gluconate Cloth  6 each Topical Daily  ? [START ON 10/19/2021] darbepoetin (ARANESP) injection - DIALYSIS  150 mcg Intravenous Q Thu-HD  ? ezetimibe  10 mg Per Tube Daily  ? feeding supplement (PROSource TF)  45 mL Per Tube QID  ? fiber  1 packet Per Tube TID  ? folic acid  1 mg Per Tube Daily  ? heparin injection (subcutaneous)  5,000 Units Subcutaneous Q8H  ? hydrALAZINE  10 mg Per Tube BID  ? insulin aspart  0-20 Units Subcutaneous Q4H  ? insulin aspart  4 Units Subcutaneous  Q4H  ? insulin detemir  14 Units Subcutaneous Q12H  ? lacosamide  75 mg Per Tube BID  ? leptospermum manuka honey  1 application. Topical UD  ? levothyroxine  50 mcg Per Tube QAC breakfast  ? mouth rinse  15 mL Mouth Rinse 10 times per day  ? modafinil  100 mg Per Tube Daily  ? multivitamin  1 tablet Per Tube QHS  ? pantoprazole sodium  40 mg Per Tube Daily  ? thiamine  100 mg Per Tube Daily  ? vitamin B-12  1,000 mcg Per Tube Daily  ? ? ? ?Madelon Lips, MD ?10/17/2021, 11:57 AM   ?

## 2021-10-17 NOTE — Progress Notes (Signed)
Nutrition Follow-up ? ?DOCUMENTATION CODES:  ? ?Not applicable ? ?INTERVENTION:  ? ?Resume tube feeds via Cortrak tube: ?- Vital 1.5 @ 50 ml/hr (1200 ml/day) ?- ProSource TF 45 ml QID ?  ?Tube feeding regimen provides 1960 kcal, 125 grams of protein, and 917 ml of H2O. ?  ?- Continue renal MVI daily per tube ? ?NUTRITION DIAGNOSIS:  ? ?Inadequate oral intake related to inability to eat as evidenced by NPO status. ? ?Ongoing, being addressed via TF ? ?GOAL:  ? ?Patient will meet greater than or equal to 90% of their needs ? ?Met via TF ? ?MONITOR:  ? ?Vent status, Labs, Weight trends, TF tolerance, Skin, I & O's ? ?REASON FOR ASSESSMENT:  ? ?Ventilator, Consult ?Enteral/tube feeding initiation and management ? ?ASSESSMENT:  ? ?51 year old female who presented to the ED on 3/29 with seizure-like activity. PMH of ESRD on HD, CHF, anemia, CAD, T2DM, HLD, HTN, CVA, GERD. Pt required intubation for airway protection. Pt admitted with acute metabolic encephalopathy, probable aspiration PNA. ? ?03/30 - tube feeding protocol started ?04/11 - tracheostomy attempted but unsuccessful ? ?Discussed pt with RN and during ICU rounds. Pt with hyperkalemia due to delayed treatment per Nephrology. Hyperkalemia resolved after iHD treatment this morning. Pt with 3000 ml net UF. Post-HD weight was 76.5 kg. ? ?Plan is for tracheostomy by ENT. Cortrak placed this morning in anticipation of trach. ? ?Current TF: Vital 1.5 @ 50 ml/hr, ProSource TF 45 ml QID ? ?EDW: 75.5 kg ?Admit weight: 83.9 kg ?Current weight: 76.5 kg ? ?Pt with mild pitting edema to BUE and BLE. ? ?Patient remains intubated on ventilator support ?MV: 12.9 L/min ?Temp (24hrs), Avg:99 ?F (37.2 ?C), Min:98.2 ?F (36.8 ?C), Max:99.9 ?F (37.7 ?C) ? ?Medications reviewed and include: calcitriol, questran 4 grams TID, aranesp, nutrisource fiber TID, folic acid, SSI q 4 hours, novolog 4 units q 4 hours, levemir 14 units BID, rena-vit, protonix, prednisone, thiamine, vitamin B-12  1000 mcg daily, ferric gluconate, IV abx ? ?Labs reviewed: phosphorus 4.9, elevated LFTs, WBC 10.8, hemoglobin 8.8 ?CBG's: 104-284 x 24 hours ? ?I/O's: +12.0 L since admit ? ?Diet Order:   ?Diet Order   ? ?       ?  Diet NPO time specified  Diet effective now       ?  ? ?  ?  ? ?  ? ? ?EDUCATION NEEDS:  ? ?Not appropriate for education at this time ? ?Skin:  Skin Assessment: ?Skin Integrity Issues: ?DTI: R foot toe ?Stage I: sacrum ?Stage III: anus ?Incisions: neck ?Other: MASD pelvis ? ?Last BM:  10/18/21 multiple type 7 ? ?Height:  ? ?Ht Readings from Last 1 Encounters:  ?09/28/2021 _0  (1.6 m)  ? ? ?Weight:  ? ?Wt Readings from Last 1 Encounters:  ?10/18/21 76.5 kg  ? ? ?Ideal Body Weight:  52.3 kg ? ?BMI:  Body mass index is 29.88 kg/m?. ? ?Estimated Nutritional Needs:  ? ?Kcal:  1950-2150 ? ?Protein:  115-135 grams ? ?Fluid:  1000 ml + UOP ? ? ? ?Gustavus Bryant, MS, RD, LDN ?Inpatient Clinical Dietitian ?Please see AMiON for contact information. ? ?

## 2021-10-17 NOTE — Procedures (Signed)
Intubation Procedure Note ? ?Melanie Hall  ?[id number]  ?[birth year] ? ?Date:10/17/21  ?Time:12:24 PM  ? ?Provider Performing:Arianna Delsanto  ? ? ?Procedure: Intubation (32671) ? ?Indication(s) ?Respiratory Failure ? ?Consent ?Risks of the procedure as well as the alternatives and risks of each were explained to the patient and/or caregiver.  Consent for the procedure was obtained and is signed in the bedside chart ? ? ?Anesthesia ?Etomidate and Rocuronium ? ? ?Time Out ?Verified patient identification, verified procedure, site/side was marked, verified correct patient position, special equipment/implants available, medications/allergies/relevant history reviewed, required imaging and test results available. ? ? ?Sterile Technique ?Usual hand hygeine, masks, and gloves were used ? ? ?Procedure Description ?Patient positioned in bed supine.  Sedation given as noted above.  Patient was intubated with endotracheal tube using  DL .  View was Grade 1 full glottis .  Number of attempts was 1.  Colorimetric CO2 detector was consistent with tracheal placement. ? ? ?Complications/Tolerance ?None; patient tolerated the procedure well. ?Chest X-ray is ordered to verify placement. ? ? ?EBL ?Minimal ? ? ?Specimen(s) ?None ? ?

## 2021-10-17 NOTE — Progress Notes (Signed)
eLink Physician-Brief Progress Note ?Patient Name: Melanie Hall ?DOB: Oct 25, 1970 ?MRN: [id number] ? ? ?Date of Service ? 10/17/2021  ?HPI/Events of Note ? Radiology request to write orders for contrast allergy prep.   ?eICU Interventions ? Will order contrast allergy prep protocol.  ? ? ? ?Intervention Category ?Major Interventions: Other: ? ?Xzavior Reinig Cornelia Copa ?10/17/2021, 9:22 PM ?

## 2021-10-17 NOTE — Progress Notes (Signed)
eLink Physician-Brief Progress Note ?Patient Name: Melanie Hall ?DOB: 08-Jun-1971 ?MRN: [id number] ? ? ?Date of Service ? 10/17/2021  ?HPI/Events of Note ? Anemia - Hgb = 6.9.   ?eICU Interventions ? Will transfuse 1 unit PRBC.  ? ? ? ?Intervention Category ?Major Interventions: Other: ? ?[name] Cornelia Copa ?10/17/2021, 3:17 AM ?

## 2021-10-17 NOTE — Progress Notes (Signed)
? ?NAME:  Melanie Hall, MRN:  [id number], DOB:  Mar 22, 1971, LOS: 65 ?ADMISSION DATE:  09/17/2021, CONSULTATION DATE:  09/28/2021 ?REFERRING MD:  Dr. Roderic Palau, ER, CHIEF COMPLAINT:  AMS  ? ?History of Present Illness:  ?51 yo female former smoker with hx of ESRD on HD was found at her home by patient transport driver unresponsive.  Pt's neighbor noted that she was tapping her hands for about a minute, and then stopped.  She has snoring and drooling.  No improvement after narcan.  CBG normal.  Noted to have BP 215/96.  Intubated in [facility] for airway protection, given keppra load, started on diprivan, and started on antibiotics.  CT head negative for acute findings.  Treated for hyperkalemia.  Pt's daughter report she had similar episode on 09/24/21 and EMS was called to the house, but episode resolve and patient declined hospital evaluation.  No prior history of seizures or known head injury.  ER team d/w case with neurology and advised to transfer to [facility] for EEG monitoring and further neurology assessment. ? ?Hx from medical team, patients daughter at bedside, and medical records. ? ?Pertinent  Medical History  ?CHF, Anemia, OA, CAD, DM type 2, ESRD on HD, HLD, HTN, CVA ? ?Significant Hospital Events: ?Including procedures, antibiotic start and stop dates in addition to other pertinent events   ?3/29 present to [facility], intubated, transfer to [facility] ?3/30 MRI Brain consistent with PRES ?4/3 off sedation, not waking up ?4/4 transitioned off insulin gtt to SSI ?4/5 [place] conversation; see IPAL note ?4/8 MRI with no acute findings ? ?Interim History / Subjective:  ?After addition of antibiotics, patient's fever is trending down ?White count is started trending down ?Still not opening eyes or following commands ? ?Objective   ?Blood pressure 115/65, pulse 85, temperature 100.3 ?F (37.9 ?C), temperature source Oral, resp. rate (!) 37, height '5\' 3"'$  (1.6 m), weight 79.9 kg, last menstrual period 12/17/2016, SpO2 100 %. ?   ?Vent Mode:  PSV;CPAP ?FiO2 (%):  [40 %] 40 % ?Set Rate:  [15 bmp] 15 bmp ?Vt Set:  [410 mL] 410 mL ?PEEP:  [5 cmH20] 5 cmH20 ?Pressure Support:  [8 cmH20-10 cmH20] 10 cmH20 ?Plateau Pressure:  [16 cmH20] 16 cmH20  ? ?Intake/Output Summary (Last 24 hours) at 10/17/2021 0830 ?Last data filed at 10/17/2021 2706 ?Gross per 24 hour  ?Intake 2272.19 ml  ?Output --  ?Net 2272.19 ml  ? ?Filed Weights  ? 10/14/21 0433 10/15/21 0630 10/16/21 0339  ?Weight: 81.2 kg 80.4 kg 79.9 kg  ? ?Physical Exam: ?General: Crtitically ill-appearing female, orally intubated ?HEENT: [place]/AT, eyes anicteric.  ETT and OGT in place ?Neuro: Eyes closed, does not open, withdrawing in all 4 extremities with painful stimuli, positive cough and gag ?Chest: Coarse breath sounds, no wheezes or rhonchi ?Heart: Regular rate and rhythm, no murmurs or gallops ?Abdomen: Soft, nontender, nondistended, bowel sounds present ?Skin: No rash ? ?Resolved Hospital Problem list   ? ? ?Assessment & Plan:  ? ?PRES syndrome with associated status epilepticus ?Acute Metabolic encephalopathy secondary to PRES ?MRI on 3/30 consistent with PRES, repeat MRI on 4 8 showed resolution of PRES ?Patient remained encephalopathic, still not waking up ?Continue Provigil ?Continue seizure precautions ?Keppra was stopped yesterday per neurology recommendations ?Continue Vimpat ? ?Acute respiratory failure secondary to probable aspiration pneumonia ?Patient became afebrile this morning ?Respiratory culture is still pending ?Continue IV Zosyn ?Continue lung protective ventilation  ?VAP prevention in place ?We will do trial of extubation today, if she  fails, patient's family is in agreement to proceed with endotracheal intubation followed by tracheostomy ? ?End-stage renal disease on hemodialysis ?Hyponatremia ?Hyperphosphatemia ?Continue intermittent hemodialysis ?Replace electrolytes, trend ? ?Chronic HFrEF  ?Coronary artery disease/hyperlipidemia ?Continue carvedilol, hydralazine, aspirin, statin,  Zetia ? ?Type 2 diabetes with hyperglycemia ?Blood sugars are better today ?Continue Lantus, continue SSI ? ?Hypothyroidism ?Continue home Synthroid ? ?Unstageable sacral pressure ulcer, POA ?Continue wound care ? ?Best Practice (right click and "Reselect all SmartList Selections" daily)  ? ?Diet/type: tubefeeds ?DVT prophylaxis: prophylactic heparin  ?GI prophylaxis: PPI ?Lines: N/A ?Foley:  N/A ?Code Status:  full code ? ?Last date of multidisciplinary goals of care discussion [4/11 : Please see plan of care note  ? ?Total critical care time: 36 minutes ? ?Performed by: Jacky Kindle ?  ?Critical care time was exclusive of separately billable procedures and treating other patients. ?  ?Critical care was necessary to treat or prevent imminent or life-threatening deterioration. ?  ?Critical care was time spent personally by me on the following activities: development of treatment plan with patient and/or surrogate as well as nursing, discussions with consultants, evaluation of patient's response to treatment, examination of patient, obtaining history from patient or surrogate, ordering and performing treatments and interventions, ordering and review of laboratory studies, ordering and review of radiographic studies, pulse oximetry and re-evaluation of patient's condition. ?  ?Jacky Kindle MD ?[facility] ?See Amion for pager ?If no response to pager, please call [phone] until 7pm ?After 7pm, Please call E-link [phone] ? ?

## 2021-10-17 NOTE — Plan of Care (Signed)
Interdisciplinary Goals of Care Family Meeting ? ? ?Date carried out:: 10/17/2021 ? ?Location of the meeting: Bedside ? ?Member's involved: Physician, Bedside Registered Nurse, and Family Member or next of kin ? ?Durable Power of Attorney or acting medical decision maker: Melanie Hall   ? ?Discussion: We discussed goals of care for Melanie Hall .   ? ?The Clinical status was relayed to daughter at bedside in detail. ?  ?Updated and notified of patients medical condition. ?  ?  ?Patient remains unresponsive and will not open eyes to command.   ?Patient is having a weak cough and struggling to remove  ?Explained to family course of therapy and the modalities  ?She remained intubated for almost 2 weeks, patient's family decided to proceed with tracheostomy ? ?Code status: Full Code ? ?Disposition: Continue current acute care ? ?  ?Family are satisfied with Plan of action and management. All questions answered ?  ?Jacky Kindle MD ?[facility] ?See Amion for pager ?If no response to pager, please call [phone] until 7pm ?After 7pm, Please call E-link [phone] ? ?

## 2021-10-17 NOTE — Procedures (Signed)
Diagnostic Bronchoscopy ? ?Melanie Hall  ?[id number]  ?[birth year] ? ?Date:10/17/21  ?Time:3:10 PM  ? ?Provider Performing:Lorin Gawron Cleon Dew, under supervision of Dr. Tacy Learn ? ?Procedure: Diagnostic Bronchoscopy (67893) ? ?Indication(s) ?Assist with direct visualization of tracheostomy placement ? ?Consent ?Risks of the procedure as well as the alternatives and risks of each were explained to the patient and/or caregiver.  Consent for the procedure was obtained. ? ? ?Anesthesia ?See separate tracheostomy note ? ? ?Time Out ?Verified patient identification, verified procedure, site/side was marked, verified correct patient position, special equipment/implants available, medications/allergies/relevant history reviewed, required imaging and test results available. ? ? ?Sterile Technique ?Usual hand hygiene, masks, gowns, and gloves were used ? ? ?Procedure Description ?Bronchoscope advanced through endotracheal tube and into airway.  After suctioning out tracheal secretions, bronchoscope used to provide direct visualization the airway. Carina and tracheal rings visualized. ETT was withdrawn by RT per MD direction, with visualization of airway maintained by bronchoscope. After blunt dissection was performed, introducer needle was inserted by tracheostomy proceduralist, however this was not visualized with bronchoscope. There were then bloody secretions which obscured camera view despite flushing bronchoscope. At this point, assistance with bronchoscope was sought from additional team member, see separate bronchoscopy note.  ? ? ?Complications/Tolerance ?bleeding ? ? ?[employer]., MSN, APRN, AGACNP-BC ?[facility]  ?10/17/2021 , 3:14 PM ? ?Please see [email or web address] for pager details ? ?If no response, please call [phone] ?After hours, please call Elink at [phone] ? ?

## 2021-10-17 NOTE — Procedures (Addendum)
Diagnostic Bronchoscopy ? ?Melanie Hall  ?[id number]  ?Jan 10, 1971 ? ?Date:10/17/21  ?Time:2:01 PM  ? ?Provider Performing:Natan Hartog E Avaleigh Decuir  ? ?Procedure: Diagnostic Bronchoscopy (61607)  ?Therapeutic bronchoscopy (37106)  ? ?Indication(s) ?Assist with direct visualization of tracheostomy placement. Aspiration of blood clot  ? ?Consent ?Risks of the procedure as well as the alternatives and risks of each were explained to the patient and/or caregiver.  Consent for the procedure was obtained. ? ?Anesthesia ?See separate tracheostomy note ? ? ?Time Out ?Verified patient identification, verified procedure, site/side was marked, verified correct patient position, special equipment/implants available, medications/allergies/relevant history reviewed, required imaging and test results available. ? ? ?Sterile Technique ?Usual hand hygiene, masks, gowns, and gloves were used ? ?Procedure Description ? ?Called to bedside to assist in bronchoscopy for tracheostomy placement. On my arrival, procedure was underway, with ETT partially withdrawn and brochoscope passed through ETT. There was significant blood clot burden obscuring visualization of airway. I took over bronchoscopy. Clot suctioned from trachea. While inspecting trachea, introducer needle and guidewire were visualized entering anterior airway. However with ongoing bleeding and re-accumulating clot burden, anatomy was unclear and there was concern for possible positioning above vocal cords.Tracheostomy aborted. After tracheostomy terminated, therapeutic bronchoscopy with aspiration clot and blood in trachea performed. There was apparent blown cuff on ETT. Bronchoscope removed and ETT exchanged -- see separate procedure note.  ?After endotracheal tube exchanged -- bronchoscope reinserted and therapeutic aspiration of blood clot and clot performed, bilateral lobes. Multiple clots removed from both right and left lobes. RML with copious thin bloody secretions.  ?After  satisfactory therapeutic aspiration, airway inspected and bronchoscope removed. ? ?Complications/Tolerance ?Significant blood clot burden.  Tracheostomy aborted. ETT exchanged.  ? ? ?Specimen(s) ?None  ? ?Eliseo Gum MSN, AGACNP-BC ?[facility] ?10/17/2021, 2:19 PM ? ?

## 2021-10-17 NOTE — Progress Notes (Signed)
PT Cancellation Note ? ?Patient Details ?Name: Melanie Hall ?MRN: [id number] ?DOB: [birth year] ? ? ?Cancelled Treatment:    Reason Eval/Treat Not Completed: Patient not medically ready. Will follow up in a couple of days for medical readiness. ? ? ?Shary Decamp [facility] ?10/17/2021, 2:55 PM ?Suanne Marker PT ?Acute Rehabilitation Services ?Office [phone] ? ?

## 2021-10-17 NOTE — Progress Notes (Signed)
RTx2, RN and MD at bedside for tracheostomy procedure. Procedure was unsuccessful. During procedure pt's ETT balloon was blown. Tube exchange was done by MD. ?

## 2021-10-17 NOTE — Procedures (Signed)
Percutaneous Tracheostomy Procedure Note ? ? ?Melanie Hall  ?[id number]  ?Sep 26, 1970 ? ?Date:10/17/21  ?Time:2:00 PM  ? ?Provider Performing:Barbarajean Kinzler ? ?Procedure: Percutaneous Tracheostomy with Bronchoscopic Guidance (84166) ? ?Indication(s) ?Acute respiratory failure ? ?Consent ?Risks of the procedure as well as the alternatives and risks of each were explained to the patient and/or caregiver.  Consent for the procedure was obtained. ? ?Anesthesia ?Etomidate, Versed, Fentanyl, Vecuronium ? ? ?Time Out ?Verified patient identification, verified procedure, site/side was marked, verified correct patient position, special equipment/implants available, medications/allergies/relevant history reviewed, required imaging and test results available. ? ? ?Sterile Technique ?Maximal sterile technique including sterile barrier drape, hand hygiene, sterile gown, sterile gloves, mask, hair covering. ? ? ? ?Procedure Description ?Appropriate anatomy identified by palpation.  Patient's neck prepped and draped in sterile fashion.  1% lidocaine with epinephrine was used to anesthetize skin overlying neck.  1.5cm incision made and blunt dissection performed until tracheal rings could be easily palpated.  She has very short neck, guidewire noted to be above the vocal cords, procedure was aborted and guaze was applied to attempted trach opening. ENT was called for surgical tracheostomy ?Patient daughter was informed ? ?Complications/Tolerance ?None; patient tolerated the procedure well. ?Chest X-ray is ordered to confirm no post-procedural complication. ? ? ?EBL ?Minimal ? ? ?Specimen(s) ?None  ? ?

## 2021-10-18 ENCOUNTER — Inpatient Hospital Stay (HOSPITAL_COMMUNITY): Payer: Medicaid Other

## 2021-10-18 ENCOUNTER — Encounter (HOSPITAL_COMMUNITY): Payer: Medicaid Other | Admitting: Physical Therapy

## 2021-10-18 DIAGNOSIS — J9601 Acute respiratory failure with hypoxia: Secondary | ICD-10-CM | POA: Diagnosis not present

## 2021-10-18 DIAGNOSIS — N186 End stage renal disease: Secondary | ICD-10-CM | POA: Diagnosis not present

## 2021-10-18 DIAGNOSIS — I6783 Posterior reversible encephalopathy syndrome: Secondary | ICD-10-CM | POA: Diagnosis not present

## 2021-10-18 DIAGNOSIS — R4182 Altered mental status, unspecified: Secondary | ICD-10-CM | POA: Diagnosis not present

## 2021-10-18 LAB — COMPREHENSIVE METABOLIC PANEL
ALT: 94 U/L — ABNORMAL HIGH (ref 0–44)
AST: 80 U/L — ABNORMAL HIGH (ref 15–41)
Albumin: 2.6 g/dL — ABNORMAL LOW (ref 3.5–5.0)
Alkaline Phosphatase: 195 U/L — ABNORMAL HIGH (ref 38–126)
Anion gap: 22 — ABNORMAL HIGH (ref 5–15)
BUN: 112 mg/dL — ABNORMAL HIGH (ref 6–20)
CO2: 18 mmol/L — ABNORMAL LOW (ref 22–32)
Calcium: 8.7 mg/dL — ABNORMAL LOW (ref 8.9–10.3)
Chloride: 91 mmol/L — ABNORMAL LOW (ref 98–111)
Creatinine, Ser: 6.62 mg/dL — ABNORMAL HIGH (ref 0.44–1.00)
GFR, Estimated: 7 mL/min — ABNORMAL LOW (ref >60)
Glucose, Bld: 232 mg/dL — ABNORMAL HIGH (ref 70–99)
Potassium: 6.7 mmol/L (ref 3.5–5.1)
Sodium: 131 mmol/L — ABNORMAL LOW (ref 135–145)
Total Bilirubin: 1.3 mg/dL — ABNORMAL HIGH (ref 0.3–1.2)
Total Protein: 7.6 g/dL (ref 6.5–8.1)

## 2021-10-18 LAB — PROTIME-INR
INR: 1.3 — ABNORMAL HIGH (ref 0.8–1.2)
Prothrombin Time: 16 seconds — ABNORMAL HIGH (ref 11.4–15.2)

## 2021-10-18 LAB — BPAM RBC
Blood Product Expiration Date: 202304252359
ISSUE DATE / TIME: 202304110901
Unit Type and Rh: 9500

## 2021-10-18 LAB — CBC WITH DIFFERENTIAL/PLATELET
Abs Immature Granulocytes: 0.07 10*3/uL (ref 0.00–0.07)
Basophils Absolute: 0.1 10*3/uL (ref 0.0–0.1)
Basophils Relative: 1 %
Eosinophils Absolute: 0.4 10*3/uL (ref 0.0–0.5)
Eosinophils Relative: 3 %
HCT: 25.6 % — ABNORMAL LOW (ref 36.0–46.0)
Hemoglobin: 8.8 g/dL — ABNORMAL LOW (ref 12.0–15.0)
Immature Granulocytes: 1 %
Lymphocytes Relative: 4 %
Lymphs Abs: 0.4 10*3/uL — ABNORMAL LOW (ref 0.7–4.0)
MCH: 33.5 pg (ref 26.0–34.0)
MCHC: 34.4 g/dL (ref 30.0–36.0)
MCV: 97.3 fL (ref 80.0–100.0)
Monocytes Absolute: 0.4 10*3/uL (ref 0.1–1.0)
Monocytes Relative: 3 %
Neutro Abs: 9.5 10*3/uL — ABNORMAL HIGH (ref 1.7–7.7)
Neutrophils Relative %: 88 %
Platelets: 260 10*3/uL (ref 150–400)
RBC: 2.63 MIL/uL — ABNORMAL LOW (ref 3.87–5.11)
RDW: 17.4 % — ABNORMAL HIGH (ref 11.5–15.5)
WBC: 10.8 10*3/uL — ABNORMAL HIGH (ref 4.0–10.5)
nRBC: 0 % (ref 0.0–0.2)

## 2021-10-18 LAB — CULTURE, RESPIRATORY W GRAM STAIN: Culture: NORMAL

## 2021-10-18 LAB — TYPE AND SCREEN
ABO/RH(D): O NEG
Antibody Screen: NEGATIVE
Unit division: 0

## 2021-10-18 LAB — RENAL FUNCTION PANEL
Albumin: 2.6 g/dL — ABNORMAL LOW (ref 3.5–5.0)
Anion gap: 18 — ABNORMAL HIGH (ref 5–15)
BUN: 61 mg/dL — ABNORMAL HIGH (ref 6–20)
CO2: 24 mmol/L (ref 22–32)
Calcium: 9 mg/dL (ref 8.9–10.3)
Chloride: 93 mmol/L — ABNORMAL LOW (ref 98–111)
Creatinine, Ser: 3.93 mg/dL — ABNORMAL HIGH (ref 0.44–1.00)
GFR, Estimated: 13 mL/min — ABNORMAL LOW (ref >60)
Glucose, Bld: 253 mg/dL — ABNORMAL HIGH (ref 70–99)
Phosphorus: 4.9 mg/dL — ABNORMAL HIGH (ref 2.5–4.6)
Potassium: 4 mmol/L (ref 3.5–5.1)
Sodium: 135 mmol/L (ref 135–145)

## 2021-10-18 LAB — GLUCOSE, CAPILLARY
Glucose-Capillary: 264 mg/dL — ABNORMAL HIGH (ref 70–99)
Glucose-Capillary: 284 mg/dL — ABNORMAL HIGH (ref 70–99)
Glucose-Capillary: 185 mg/dL — ABNORMAL HIGH (ref 70–99)
Glucose-Capillary: 179 mg/dL — ABNORMAL HIGH (ref 70–99)
Glucose-Capillary: 328 mg/dL — ABNORMAL HIGH (ref 70–99)
Glucose-Capillary: 368 mg/dL — ABNORMAL HIGH (ref 70–99)

## 2021-10-18 LAB — APTT: aPTT: 44 seconds — ABNORMAL HIGH (ref 24–36)

## 2021-10-18 LAB — HEMOGLOBIN AND HEMATOCRIT, BLOOD
HCT: 25.7 % — ABNORMAL LOW (ref 36.0–46.0)
Hemoglobin: 8.6 g/dL — ABNORMAL LOW (ref 12.0–15.0)

## 2021-10-18 IMAGING — CT CT NECK W/ CM
3 of 4 series · 13 of 33 positions shown, 16 images · IV contrast (APPLIED)
Comparison: Neck CT [DATE].  CT angiogram head/neck [DATE].

CLINICAL DATA: Provided history: Aborted bedside percutaneous
tracheostomy, evaluate anatomy.

EXAM:
CT NECK WITH CONTRAST
TECHNIQUE: Multidetector CT imaging of the neck was performed using the
standard protocol following the bolus administration of intravenous
contrast.

[Series 3: neck 2.0 i31s 3 · axial · 0.53mm/px · z∈[-211,-63]mm · 5 of 112 slices shown, 7 images]
[im 19/112  soft-tissue]
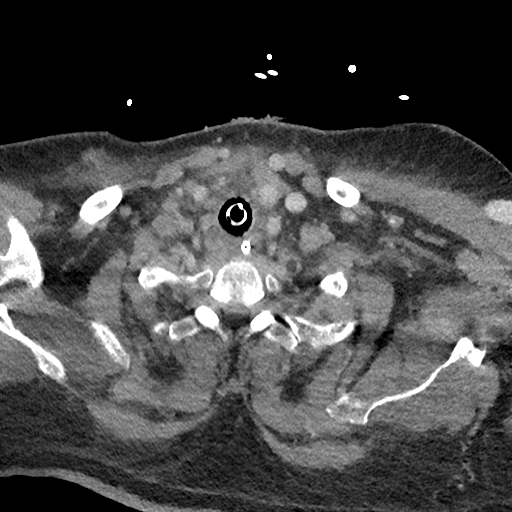
[im 19/112  bone]
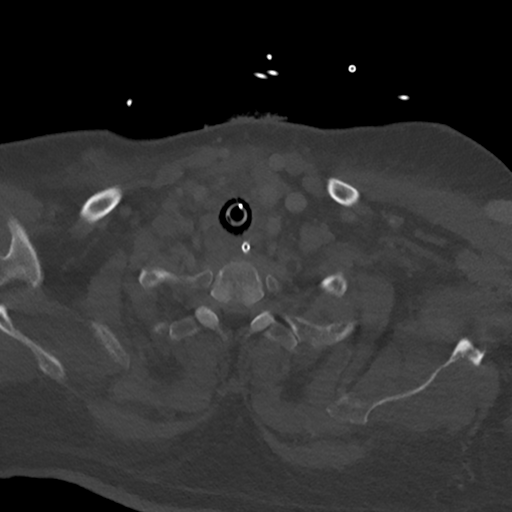
[im 38/112  bone]
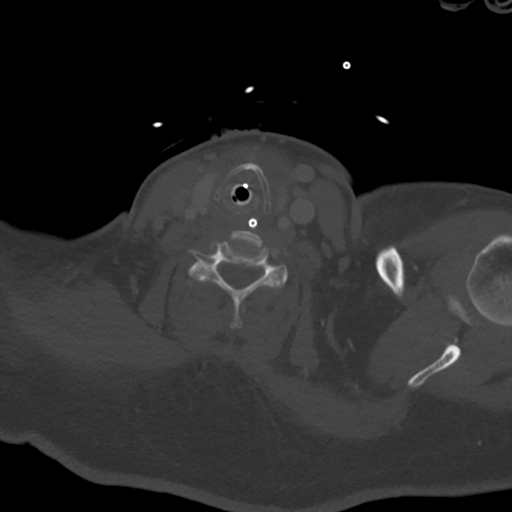
[im 56/112  bone]
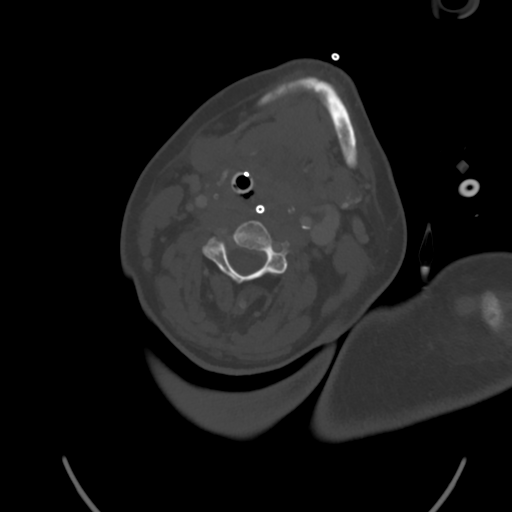
[im 75/112  bone]
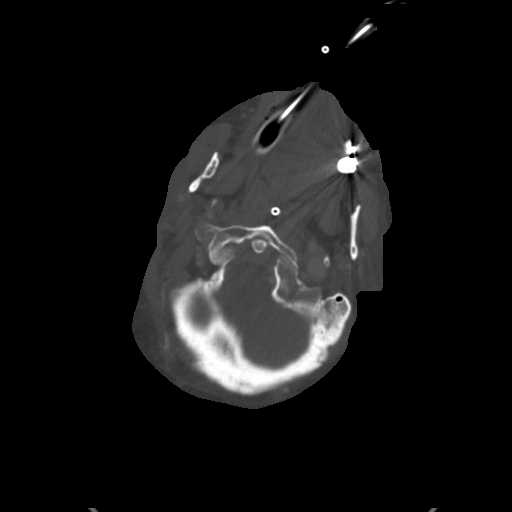
[im 93/112  soft-tissue]
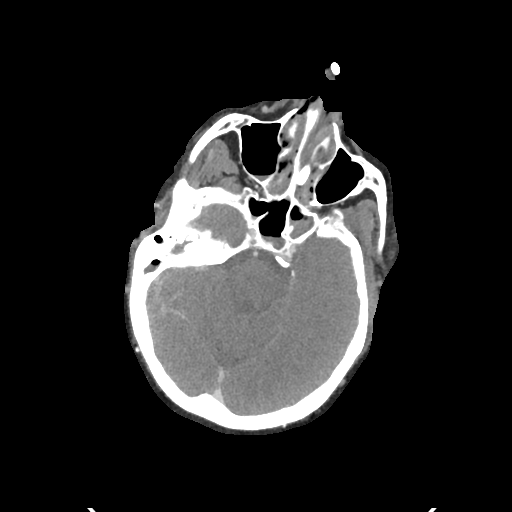
[im 93/112  bone]
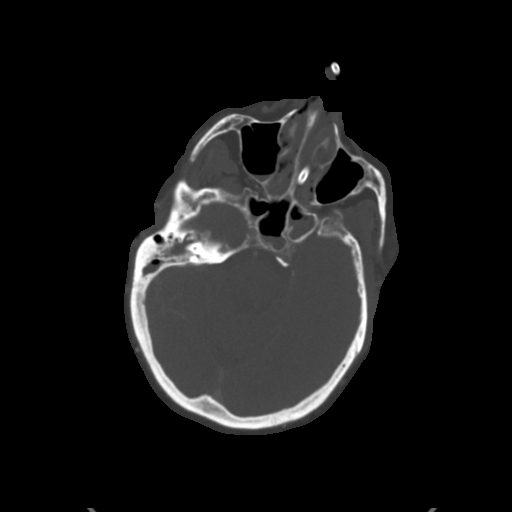

[Series 5: coronal st · coronal · 0.44mm/px · 3 of 125 slices shown]
[im 25/125  bone]
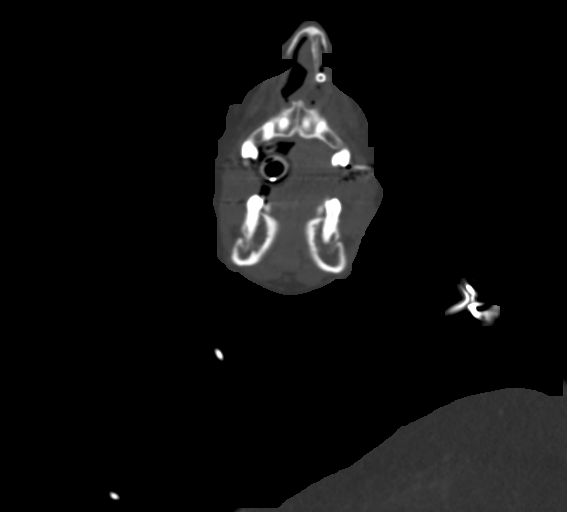
[im 50/125  bone]
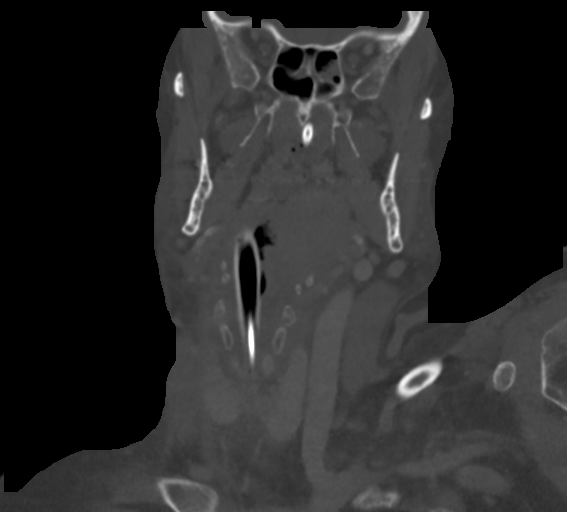
[im 75/125  bone]
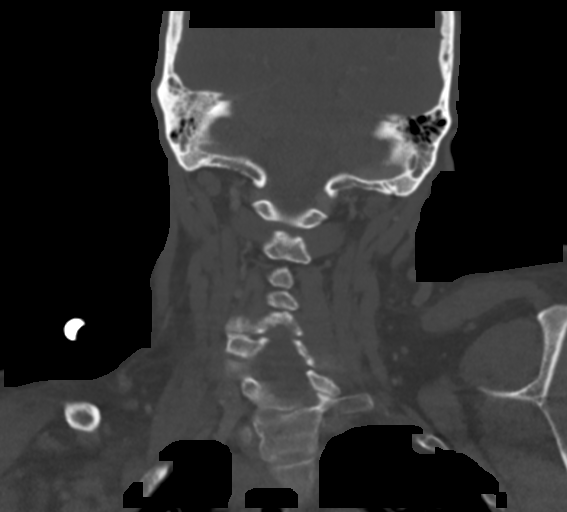

[Series 6: sagittal st · sagittal · 0.45mm/px · 5 of 101 slices shown, 6 images]
[im 34/101  bone]
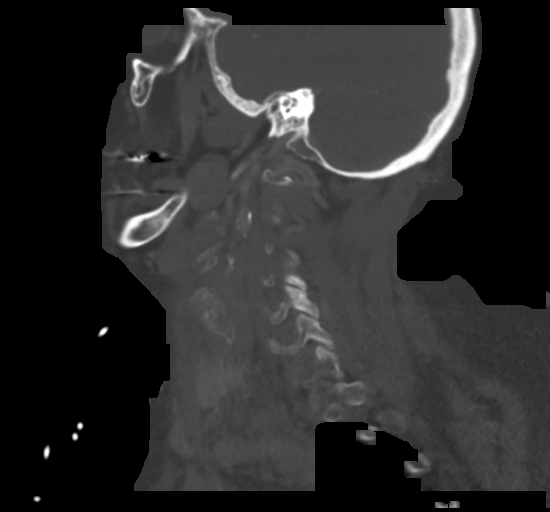
[im 42/101  bone]
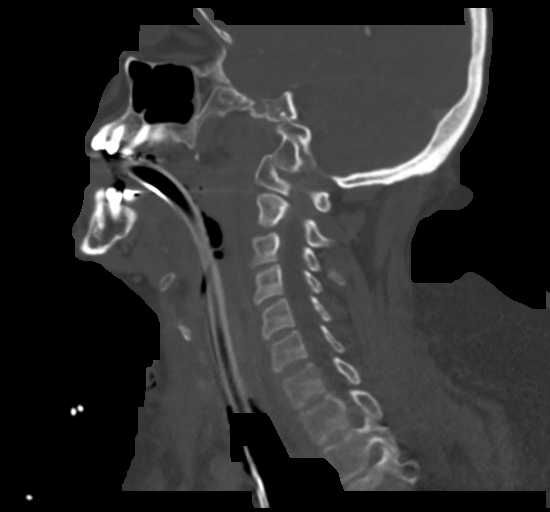
[im 51/101  soft-tissue]
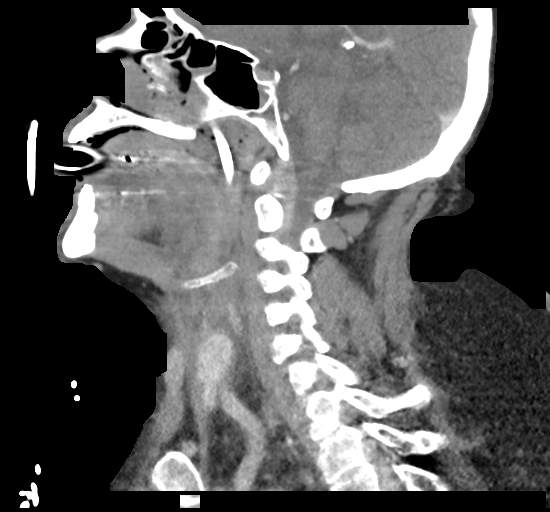
[im 51/101  bone]
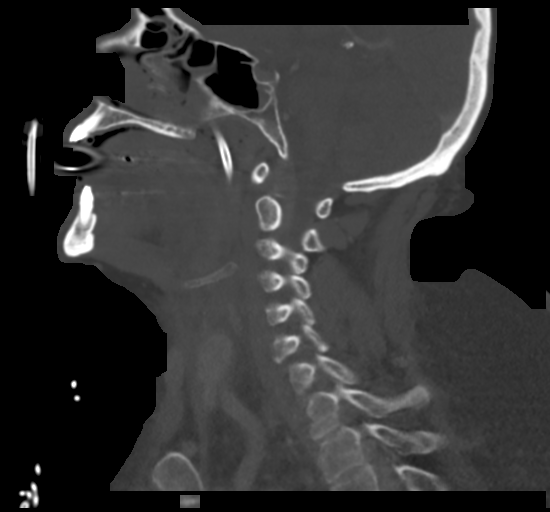
[im 59/101  bone]
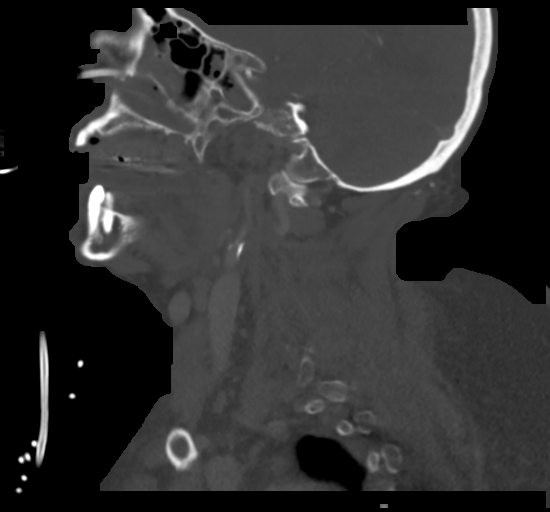
[im 67/101  bone]
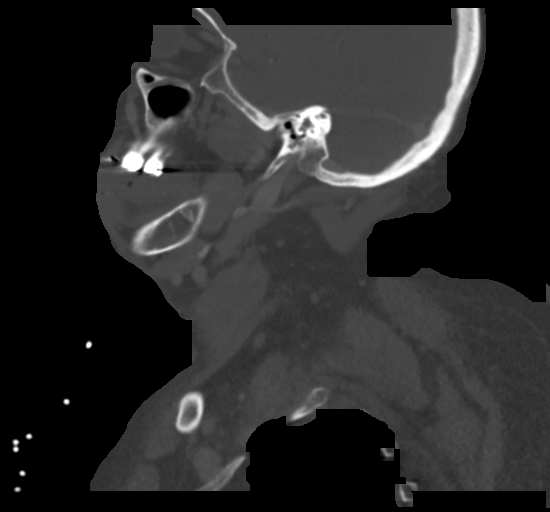

[13 of 33 positions shown; findings below may reference images not displayed]

RADIATION DOSE REDUCTION: This exam was performed according to the
departmental dose-optimization program which includes automated
exposure control, adjustment of the mA and/or kV according to
patient size and/or use of iterative reconstruction technique.

CONTRAST:  75mL OMNIPAQUE IOHEXOL 300 MG/ML  SOLN
FINDINGS: Incompletely imaged endotracheal and left nasoenteric tubes.

Pharynx and larynx: The presence of secretions and life-support
tubes within the oral cavity, pharynx and larynx limits evaluation
for swelling and masses at these sites. Within this limitation, no
definite swelling or discrete mass is identified within the oral
cavity, pharynx or larynx.

Salivary glands: No inflammation, mass, or stone.

Thyroid: Subcentimeter right thyroid lobe nodule, not meeting
consensus criteria for ultrasound follow-up based on size.

Lymph nodes: No pathologically enlarged lymph nodes identified
within the neck.

Vascular: The major vascular structures of the neck are patent.
Atherosclerotic plaque about the carotid bifurcations and within the
proximal internal carotid arteries, bilaterally.

Limited intracranial: No evidence of acute intracranial abnormality
within the field of view.

Visualized orbits: No orbital mass or acute orbital finding.

Mastoids and visualized paranasal sinuses: Scattered mucosal
thickening and fluid within the bilateral ethmoid sinuses. Frothy
secretions and fluid levels within the bilateral sphenoid sinuses.
Small fluid level within the right maxillary sinus. Minimal mucosal
thickening within the left maxillary sinus. Bilateral mastoid
effusions.

Skeleton: Cervical dextrocurvature with partially imaged thoracic
levocurvature. No acute bony abnormality or aggressive osseous
lesion.

Upper chest: Mild dependent atelectasis within the imaged right lung
apex.

Other: Soft tissue defect within the midline ventral lower neck
consistent with the provided history of aborted bedside
tracheostomy. Additionally, there is mild nonspecific edema within
the central mid-to-lower neck.
IMPRESSION: Soft tissue defect within the midline ventral lower neck consistent
with the provided history of aborted bedside tracheostomy.
Additionally, there is mild nonspecific edema within the central
mid-to-lower neck.

No definite swelling or discrete mass is identified within the oral
cavity, pharynx or larynx. However, the presence of life-support
tubes and secretions within the aerodigestive tract significantly
limits evaluation.

Paranasal sinus disease with bilateral mastoid effusions, as
described.

## 2021-10-18 IMAGING — DX DG ABD PORTABLE 1V
1 series · 1 of 1 positions shown · non-contrast
Comparison: [DATE] abdomen film and earlier.

CLINICAL DATA: 50-year-old female feeding tube placement.

EXAM:
PORTABLE ABDOMEN - 1 VIEW

[abdomen]
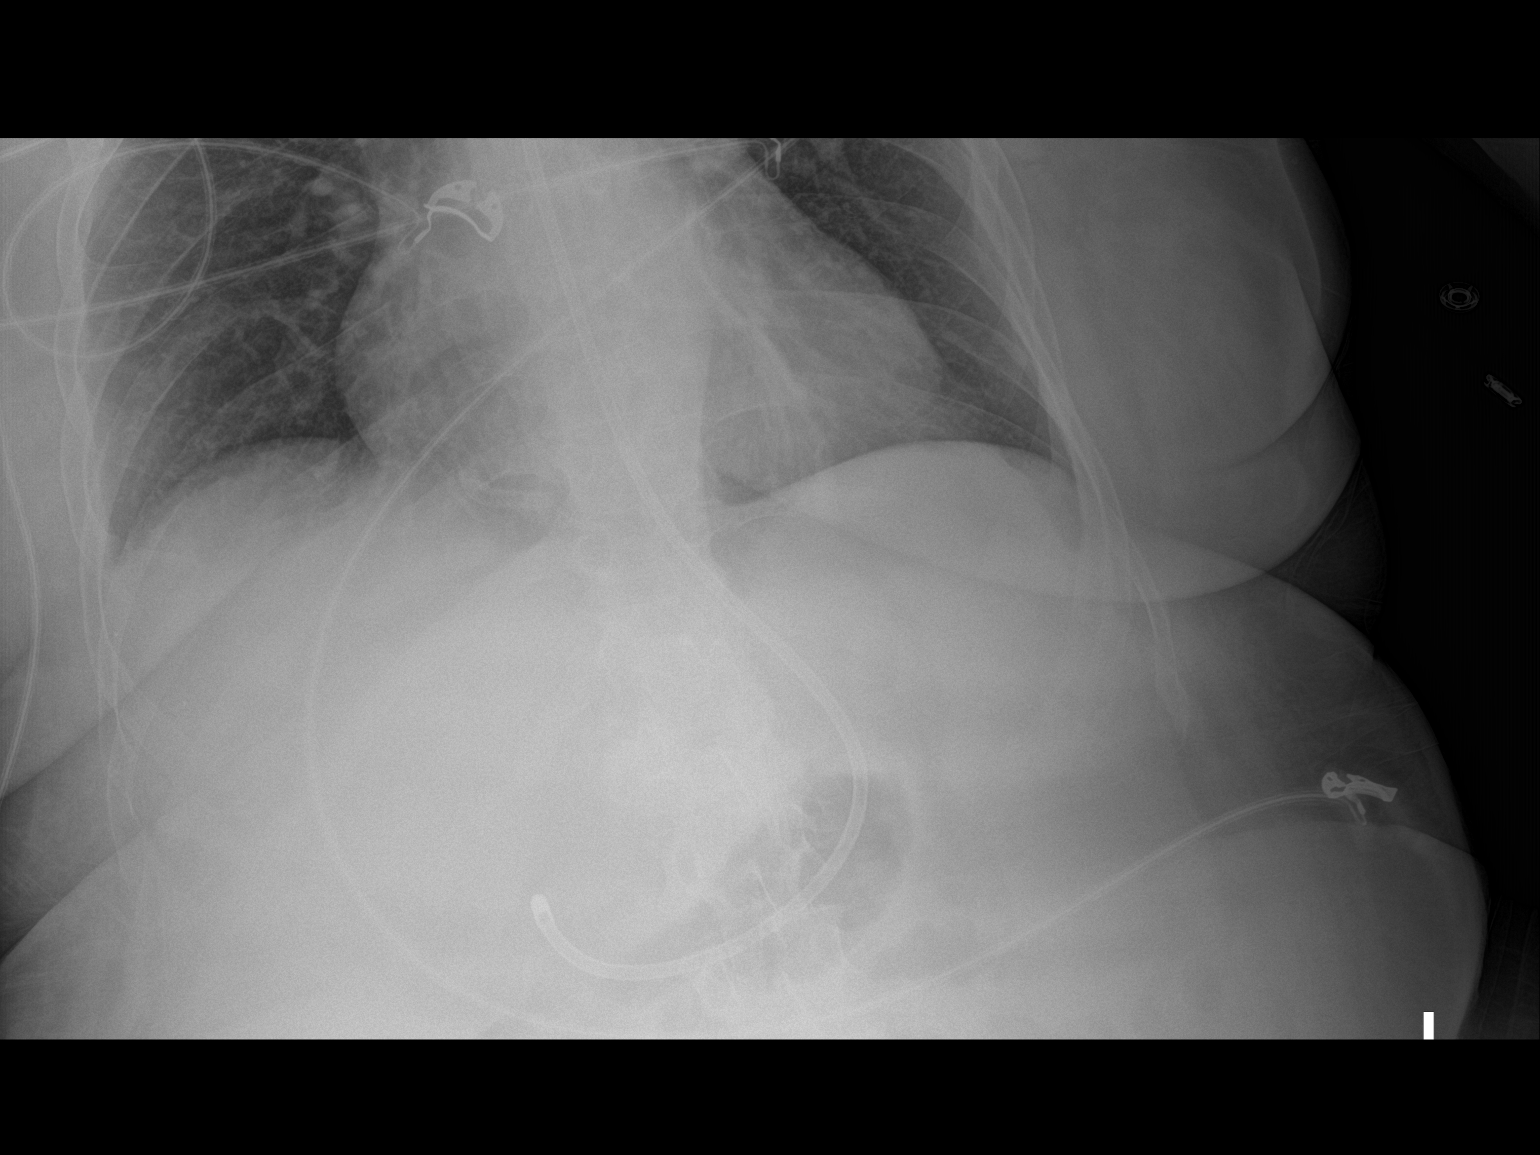

[1 of 1 positions shown; findings below may reference images not displayed]

FINDINGS: Portable AP semi upright view at [EJ] hours. NG type tube has been
removed and enteric feeding tube is now in place with tip at the
gastric antrum or duodenal bulb in the right upper quadrant.
Negative lung bases. Negative visible bowel gas.
IMPRESSION: NG tube removed and feeding tube placed with tip at the distal
stomach versus duodenal bulb. Advance 5 cm to ensure post pyloric
placement.

## 2021-10-18 MED ORDER — LIDOCAINE-EPINEPHRINE (PF) 1 %-1:200000 IJ SOLN
10.0000 mL | Freq: Once | INTRAMUSCULAR | Status: DC | PRN
  Filled 2021-10-18: qty 10

## 2021-10-18 MED ORDER — SODIUM BICARBONATE 8.4 % IV SOLN
100.0000 meq | Freq: Once | INTRAVENOUS | Status: AC
  Administered 2021-10-18: 100 meq via INTRAVENOUS
  Filled 2021-10-18: qty 50

## 2021-10-18 MED ORDER — OXYCODONE HCL 5 MG PO TABS
5.0000 mg | ORAL_TABLET | Freq: Once | ORAL | Status: AC
  Administered 2021-10-19: 5 mg
  Filled 2021-10-18: qty 1

## 2021-10-18 MED ORDER — DEXMEDETOMIDINE HCL IN NACL 400 MCG/100ML IV SOLN
0.4000 ug/kg/h | INTRAVENOUS | Status: DC
  Administered 2021-10-18 – 2021-10-19 (×4): 1.2 ug/kg/h via INTRAVENOUS
  Filled 2021-10-18 (×4): qty 100

## 2021-10-18 MED ORDER — MIDAZOLAM HCL 2 MG/2ML IJ SOLN
INTRAMUSCULAR | Status: AC
Start: 2021-10-18 — End: 2021-10-18
  Administered 2021-10-18: 2 mg
  Filled 2021-10-18: qty 2

## 2021-10-18 MED ORDER — PIPERACILLIN-TAZOBACTAM IN DEX 2-0.25 GM/50ML IV SOLN
2.2500 g | Freq: Three times a day (TID) | INTRAVENOUS | Status: AC
  Administered 2021-10-18 – 2021-10-21 (×8): 2.25 g via INTRAVENOUS
  Filled 2021-10-18 (×8): qty 50

## 2021-10-18 MED ORDER — "THROMBI-PAD 3""X3"" EX PADS"
1.0000 | MEDICATED_PAD | Freq: Once | CUTANEOUS | Status: AC
  Administered 2021-10-18: 1 via TOPICAL
  Filled 2021-10-18: qty 1

## 2021-10-18 MED ORDER — CALCIUM GLUCONATE-NACL 1-0.675 GM/50ML-% IV SOLN
1.0000 g | Freq: Once | INTRAVENOUS | Status: AC
  Administered 2021-10-18: 1000 mg via INTRAVENOUS
  Filled 2021-10-18: qty 50

## 2021-10-18 MED ORDER — FENTANYL CITRATE (PF) 100 MCG/2ML IJ SOLN
INTRAMUSCULAR | Status: AC
  Filled 2021-10-18: qty 2

## 2021-10-18 MED ORDER — FENTANYL CITRATE (PF) 100 MCG/2ML IJ SOLN
100.0000 ug | Freq: Once | INTRAMUSCULAR | Status: AC
  Administered 2021-10-18: 100 ug via INTRAVENOUS

## 2021-10-18 MED ORDER — NITROGLYCERIN 2 % TD OINT
1.0000 [in_us] | TOPICAL_OINTMENT | Freq: Four times a day (QID) | TRANSDERMAL | Status: DC
  Administered 2021-10-19: 1 [in_us] via TOPICAL
  Filled 2021-10-18: qty 30

## 2021-10-18 MED ORDER — MIDAZOLAM HCL 2 MG/2ML IJ SOLN
2.0000 mg | Freq: Once | INTRAMUSCULAR | Status: AC
  Administered 2021-10-18: 2 mg via INTRAVENOUS
  Filled 2021-10-18: qty 2

## 2021-10-18 MED ORDER — HALOPERIDOL LACTATE 5 MG/ML IJ SOLN
5.0000 mg | Freq: Four times a day (QID) | INTRAMUSCULAR | Status: DC | PRN
Start: 2021-10-18 — End: 2021-10-25
  Administered 2021-10-18 – 2021-10-24 (×2): 5 mg via INTRAVENOUS
  Filled 2021-10-18 (×2): qty 1

## 2021-10-18 MED ORDER — IOHEXOL 300 MG/ML  SOLN
75.0000 mL | Freq: Once | INTRAMUSCULAR | Status: AC | PRN
  Administered 2021-10-18: 75 mL via INTRAVENOUS

## 2021-10-18 MED ORDER — SODIUM ZIRCONIUM CYCLOSILICATE 10 G PO PACK
10.0000 g | PACK | Freq: Once | ORAL | Status: AC
  Administered 2021-10-18: 10 g
  Filled 2021-10-18: qty 1

## 2021-10-18 MED ORDER — MIDAZOLAM HCL 2 MG/2ML IJ SOLN: 2.0000 mg | Freq: Once | INTRAMUSCULAR | Status: DC

## 2021-10-18 MED ORDER — CHOLESTYRAMINE 4 G PO PACK
4.0000 g | PACK | Freq: Three times a day (TID) | ORAL | Status: DC
  Administered 2021-10-18 – 2021-11-01 (×44): 4 g
  Filled 2021-10-18 (×48): qty 1

## 2021-10-18 NOTE — Progress Notes (Signed)
removed 3099ms net fluid tolerated tx well.  pre k 6.7 ran on 1k bath first of tx rechecked k 1hr into tx down to 4 switched to 2 k bath half way through tx.  pre bp 172/68 post bp 149/74 pre weight (bed scales not currently functioning. last recorded weight 79.9 kg removed 3 kg est post weight 76.5kg.  2 bandages to lua avf no bleeding dressing cdi.  2000 units heparin given as ordered due to clotting in system. ?

## 2021-10-18 NOTE — Procedures (Addendum)
Cortrak ? ?Person Inserting Tube:  Darel Ricketts, Creola Corn, RD ?Tube Type:  Cortrak - 43 inches ?Tube Size:  10 ?Tube Location:  Left nare ?Secured by: Dorann Lodge ?Technique Used to Measure Tube Placement:  Marking at nare/corner of mouth ?Cortrak Secured At:  60 cm ? ?Cortrak Tube Team Note: ? ?Consult received to place a Cortrak feeding tube.  ? ?X-ray is required, abdominal x-ray has been ordered by the Cortrak team. Please confirm tube placement before using the Cortrak tube.  ? ?If the tube becomes dislodged please keep the tube and contact the Cortrak team at [email or web address] (password [email or web address]) for replacement.  ?If after hours and replacement cannot be delayed, place a NG tube and confirm placement with an abdominal x-ray.  ? ? ? ?Theone Stanley., MS, RD, LDN (she/her/hers) ?RD pager number and weekend/on-call pager number located in [place]. ? ? ?

## 2021-10-18 NOTE — Progress Notes (Addendum)
Inpatient Diabetes Program Recommendations ? ?AACE/ADA: New Consensus Statement on Inpatient Glycemic Control (2015) ? ?Target Ranges:  Prepandial:   less than 140 mg/dL ?     Peak postprandial:   less than 180 mg/dL (1-2 hours) ?     Critically ill patients:  140 - 180 mg/dL  ? ?Lab Results  ?Component Value Date  ? GLUCAP 284 (H) 10/18/2021  ? HGBA1C 10.5 (H) 09/20/2021  ? ? ?Review of Glycemic Control ? Latest Reference Range & Units 10/17/21 07:48 10/17/21 12:45 10/17/21 15:03 10/17/21 19:59 10/17/21 23:15 10/18/21 03:31 10/18/21 07:27  ?Glucose-Capillary 70 - 99 mg/dL 194 (H) ? ?Novolog 8 units ? ?Levemir 14 units 104 (H) ? ?Tube feeds on hold no insulin given at this time per orders 139 (H) ? ?Novolog 7 units 240 (H) ? ?Novolog 6 units ? ?Levemir 14 units 178 (H) ? ?Novolog 5 units 264 (H) ? ?Novolog 7 units 284 (H) ? ?Novolog 7 units  ? ?Diabetes history: Type 2 DM ?Current orders for Inpatient glycemic control: Novolog 0-6 units Q4H ?Novolog 4 units Q4H ?Levemir 14 units BID ? ?PO prednisone 50 mg Q6 hours ?Vital 50 ml/hour ? ?Inpatient Diabetes Program Recommendations:   ? ?-  Consider increasing Novolog Tube Feed Coverage to 6 units Q4 hours ? ?Thanks, ?Tama Headings RN, MSN, BC-ADM ?Inpatient Diabetes Coordinator ?Team Pager [phone] (8a-5p) ? ? ?

## 2021-10-18 NOTE — Progress Notes (Signed)
eLink Physician-Brief Progress Note ?Patient Name: Melanie Hall ?DOB: 11/04/1970 ?MRN: [id number] ? ? ?Date of Service ? 10/18/2021  ?HPI/Events of Note ? Hyperkalemia - K+ = 6.7. QRS not widened or T wave peaked on bedside monitor  ?eICU Interventions ? Plan: ?Lokelma 10 gm per tube now.  ?NaHCO3 100 meq IV X 1 now. ?Ca++ gluconate 1 gm IV X 1 now. ?Repeat BMP at 9 AM.  ? ? ? ?Intervention Category ?Major Interventions: Electrolyte abnormality - evaluation and management ? ?Raef Sprigg Cornelia Copa ?10/18/2021, 3:19 AM ?

## 2021-10-18 NOTE — Progress Notes (Signed)
eLink Physician-Brief Progress Note ?Patient Name: NEILAH FULWIDER ?DOB: 24-Feb-1971 ?MRN: [id number] ? ? ?Date of Service ? 10/18/2021  ?HPI/Events of Note ? Multiple issues: 1. Hypertension - BP = 180/87. Patient is on Imdur at home 2. Possible pain - Nursing request 1 time dose of oral analgesic.   ?eICU Interventions ? Plan: ?Nitroglycerin ointment 2% 1 inch to skin now and Q 6 hours.  ?Oxycodone IR 5 mg per tube X 1 now.   ? ? ? ?Intervention Category ?Major Interventions: Other: ? ?Shera Laubach Cornelia Copa ?10/18/2021, 11:47 PM ?

## 2021-10-18 NOTE — Progress Notes (Signed)
? ?NAME:  Melanie Hall, MRN:  [id number], DOB:  1970-12-12, LOS: 14 ?ADMISSION DATE:  09/21/2021, CONSULTATION DATE:  10/02/2021 ?REFERRING MD:  Dr. Roderic Palau, ER, CHIEF COMPLAINT:  AMS  ? ?History of Present Illness:  ?51 yo female former smoker with hx of ESRD on HD was found at her home by patient transport driver unresponsive.  Pt's neighbor noted that she was tapping her hands for about a minute, and then stopped.  She has snoring and drooling.  No improvement after narcan.  CBG normal.  Noted to have BP 215/96.  Intubated in [facility] for airway protection, given keppra load, started on diprivan, and started on antibiotics.  CT head negative for acute findings.  Treated for hyperkalemia.  Pt's daughter report she had similar episode on 09/24/21 and EMS was called to the house, but episode resolve and patient declined hospital evaluation.  No prior history of seizures or known head injury.  ER team d/w case with neurology and advised to transfer to [facility] for EEG monitoring and further neurology assessment. ? ?Hx from medical team, patients daughter at bedside, and medical records. ? ?Pertinent  Medical History  ?CHF, Anemia, OA, CAD, DM type 2, ESRD on HD, HLD, HTN, CVA ? ?Significant Hospital Events: ?Including procedures, antibiotic start and stop dates in addition to other pertinent events   ?3/29 present to [facility], intubated, transfer to [facility] ?3/30 MRI Brain consistent with PRES ?4/3 off sedation, not waking up ?4/4 transitioned off insulin gtt to SSI ?4/5 [place] conversation; see IPAL note ?4/8 MRI with no acute findings ? ?Interim History / Subjective:  ?Patient was extubated yesterday, she failed, mental status remain poor, she was noted visibly aspirating ?She was immediately intubated, percutaneous tracheostomy was attempted but failed due to her anatomy ?ENT is consulted ? ?Objective   ?Blood pressure (!) 154/136, pulse 98, temperature 98.9 ?F (37.2 ?C), temperature source Axillary, resp. rate 16, height '5\' 3"'$  (1.6 m),  weight 79.9 kg, last menstrual period 12/17/2016, SpO2 100 %. ?   ?Vent Mode: PRVC ?FiO2 (%):  [40 %-100 %] 40 % ?Set Rate:  [15 bmp-30 bmp] 30 bmp ?Vt Set:  [410 mL] 410 mL ?PEEP:  [5 cmH20] 5 cmH20 ?Pressure Support:  [8 cmH20] 8 cmH20 ?Plateau Pressure:  [17 cmH20-22 cmH20] 17 cmH20  ? ?Intake/Output Summary (Last 24 hours) at 10/18/2021 0815 ?Last data filed at 10/18/2021 0700 ?Gross per 24 hour  ?Intake 1954.64 ml  ?Output --  ?Net 1954.64 ml  ? ?Filed Weights  ? 10/15/21 0630 10/16/21 0339 10/18/21 0645  ?Weight: 80.4 kg 79.9 kg 79.9 kg  ? ?Physical Exam: ?General: Crtitically ill-appearing female, orally intubated ?HEENT: [place]/AT, eyes anicteric.  ETT and OGT in place ?Neuro: Eyes closed, does not open, withdrawing in all 4 extremities with painful stimuli, positive cough and gag.  Remains restless ?Chest: Coarse breath sounds, no wheezes or rhonchi ?Heart: Regular rate and rhythm, no murmurs or gallops ?Abdomen: Soft, nontender, nondistended, bowel sounds present ?Skin: No rash ? ?Resolved Hospital Problem list   ? ? ?Assessment & Plan:  ? ?PRES syndrome with associated status epilepticus ?Acute Metabolic encephalopathy secondary to PRES ?MRI on 3/30 consistent with PRES, repeat MRI on 4 8 showed resolution of PRES ?Patient remained encephalopathic and agitated ?Continue Provigil ?Continue seizure precautions ?Continue Vimpat ? ?Acute respiratory failure secondary to probable aspiration pneumonia ?Patient remained afebrile after addition of Zosyn ?Respiratory culture is growing oral flora ?Extubation was attempted yesterday, she failed, had to be reintubated because of  visible aspiration ?Percutaneous tracheostomy was attempted at bedside but failed due to her anatomy ?ENT is consulted for tracheostomy ?Awaiting CT soft tissue of neck with contrast ?Continue lung protective ventilation  ?VAP prevention in place  ? ?End-stage renal disease on hemodialysis ?Hyponatremia ?Hyperkalemia ?Hyperphosphatemia ?Continue  intermittent hemodialysis ?Patient serum potassium went up to 6.7 ?Received Lokelma, bicarbonate, calcium gluconate ?Now receiving dialysis ?Replace electrolytes, trend ? ?Chronic HFrEF  ?Coronary artery disease/hyperlipidemia ?Continue carvedilol, hydralazine, aspirin, statin, Zetia ? ?Type 2 diabetes with hyperglycemia ?Blood sugars are better today ?Continue Lantus, continue SSI ? ?Hypothyroidism ?Continue home Synthroid ? ?Unstageable sacral pressure ulcer, POA ?Continue wound care ? ?Best Practice (right click and "Reselect all SmartList Selections" daily)  ? ?Diet/type: tubefeeds ?DVT prophylaxis: prophylactic heparin  ?GI prophylaxis: PPI ?Lines: N/A ?Foley:  N/A ?Code Status:  full code ? ?Last date of multidisciplinary goals of care discussion [4/11 : Please see plan of care note  ? ?Total critical care time: 34 minutes ? ?Performed by: Jacky Kindle ?  ?Critical care time was exclusive of separately billable procedures and treating other patients. ?  ?Critical care was necessary to treat or prevent imminent or life-threatening deterioration. ?  ?Critical care was time spent personally by me on the following activities: development of treatment plan with patient and/or surrogate as well as nursing, discussions with consultants, evaluation of patient's response to treatment, examination of patient, obtaining history from patient or surrogate, ordering and performing treatments and interventions, ordering and review of laboratory studies, ordering and review of radiographic studies, pulse oximetry and re-evaluation of patient's condition. ?  ?Jacky Kindle MD ?[facility] ?See Amion for pager ?If no response to pager, please call [phone] until 7pm ?After 7pm, Please call E-link [phone] ? ?

## 2021-10-18 NOTE — Procedures (Signed)
Patient was seen on dialysis and the procedure was supervised.  BFR 400  Via AVF BP is  161/71. ? ? Patient appears to be tolerating treatment well-  pre k was 6.7-  utilizing 1 k bath for half treatment  ? ?Melanie Hall ?10/18/2021 ? ?

## 2021-10-18 NOTE — Progress Notes (Signed)
eLink Physician-Brief Progress Note ?Patient Name: Melanie Hall ?DOB: 1971/01/02 ?MRN: [id number] ? ? ?Date of Service ? 10/18/2021  ?HPI/Events of Note ? Agitation - Agitation has precipitated bleeding from aborted trach site. Patient is on Heparin [place] and SCD's for DVT prophylaxis.  ?eICU Interventions ? Plan: ?H/H, PT/INR and PTT STAT. ?Thrombi-pad to bleeding trach site. ?D/C Heparin [place]. ?Precedex IV infusion. Titrate to RASS = 0 to -1. ?Will request PCCM ground team evaluate the patient at bedside at nursing request.   ? ? ? ?Intervention Category ?Major Interventions: Delirium, psychosis, severe agitation - evaluation and management;Hemorrhage - evaluation and management ? ?Edmund Holcomb Cornelia Copa ?10/18/2021, 7:28 PM ?

## 2021-10-18 NOTE — Progress Notes (Signed)
?[facility] ?Progress Note  ? ?Assessment/ Plan:   ?OP HD: TTS [facility] ?  3h 49mn  75.5kg  2/2.5 bath  400 bfr   Hep none LUE AVF ? - 3/30 here > hep B Ag negative and hep B Ab's low/ not protective ? - mircera 200 q2 wks ? - venofer 50 weekly ? - rocaltrol 0.25 tiw ?  ?CXR 3/30 - no active disease ?  ?Assessment/ Plan:  ?AMS - found unresponsive at home. Had PRES and seizures. MS remains poor Neurology following.  ?VDRF- for airway protection.  She hasn't been able to be extubated. Going for trach per notes ?ESRD - on TTS [facility]. Early AM Sunday HD, next pushed to today -  will need next Fri/Sat.  Placement for a patient on dialysis with a trach is exceedingly difficult ?Volume - up 5-6 kg peak here. Increased UF as we can ?DM2 - per pmd ?Possible sepsis - sp short course IV abx. BCx x2 NGTD ?Anemia - Hb 8-9s. Was on esa/ IV fe at OP unit. Cont darbe 150 ug q Friday and weekly IV Fe here.  ?CKD-BMM: CCa stable. Cont vdra tiw.  ?Nutrition: getting TF"s at 50 cc/hr ?Hyperkalemia -  due to delayed treatment mostly -  also transfusion-  on 1 k for half treatment this AM-  mid tx K is pending  ? ?Subjective:   ? ?HD got pushed to this AM-  K 6.7 pre HD ( had been since Sunday her last tx).  Had attempted trach yesterday-  for another attempt soon-  seen on HD -  thrashing around the bed-  not tracking  ? ?Objective:   ?BP (!) 118/100   Pulse 99   Temp 98.9 ?F (37.2 ?C) (Axillary)   Resp (!) 31   Ht '5\' 3"'  (1.6 m)   Wt 79.9 kg   LMP 12/17/2016   SpO2 100%   BMI 31.20 kg/m?  ? ?Physical Exam: ?[id number] in restraints, agitated ?CVS: RRR ?Resp: clear anteriorly ?Abd: soft ?Ext: no LE edema ?ACCESS: + T/B AVF-  accessed for HD ?Labs: ?BMET ?Recent Labs  ?Lab 10/12/21 ?0218 10/13/21 ?0130 10/14/21 ?0811904/09/23 ?0147804/10/23 ?0310 10/17/21 ?0145 10/18/21 ?02956 ?NA 137 136 138 136 134* 133* 131*  ?K 5.0 5.7* 5.1 3.5 4.8 5.2* 6.7*  ?CL 101 100 99 97* 95* 95* 91*  ?CO2 23  21* '28 28 24 22 ' 18*  ?GLUCOSE 120* 134* 104* 69* 225* 142* 232*  ?BUN 66* 95* 50* 19 57* 87* 112*  ?CREATININE 5.32* 6.60* 3.97* 1.69* 3.98* 5.46* 6.62*  ?CALCIUM 8.6* 8.4* 8.6* 9.0 8.4* 8.2* 8.7*  ?PHOS  --   --  6.2* 2.4* 6.4*  --   --   ? ?CBC ?Recent Labs  ?Lab 10/15/21 ?0213004/10/23 ?0310 10/17/21 ?0145 10/17/21 ?1444 10/18/21 ?0208  ?WBC 10.7* 12.4* 11.7*  --  10.8*  ?NEUTROABS  --   --   --   --  9.5*  ?HGB 8.4* 8.1* 6.9* 7.7* 8.8*  ?HCT 26.1* 24.8* 20.8* 23.4* 25.6*  ?MCV 100.8* 102.1* 101.5*  --  97.3  ?PLT 202 198 213  --  260  ? ? ?  ?Medications:   ? ? sodium chloride   Intravenous Once  ? aspirin  81 mg Per Tube Daily  ? atorvastatin  80 mg Per Tube q1800  ? calcitRIOL  0.25 mcg Per Tube Q T,Th,Sat-1800  ? carvedilol  25 mg Per Tube BID WC  ?  chlorhexidine gluconate (MEDLINE KIT)  15 mL Mouth Rinse BID  ? Chlorhexidine Gluconate Cloth  6 each Topical Daily  ? cholestyramine  4 g Per Tube TID  ? [START ON 10/19/2021] darbepoetin (ARANESP) injection - DIALYSIS  150 mcg Intravenous Q Thu-HD  ? diphenhydrAMINE  50 mg Per Tube Once  ? Or  ? diphenhydrAMINE  50 mg Intravenous Once  ? ezetimibe  10 mg Per Tube Daily  ? feeding supplement (PROSource TF)  45 mL Per Tube QID  ? fiber  1 packet Per Tube TID  ? folic acid  1 mg Per Tube Daily  ? heparin injection (subcutaneous)  5,000 Units Subcutaneous Q8H  ? hydrALAZINE  10 mg Per Tube BID  ? insulin aspart  0-6 Units Subcutaneous Q4H  ? insulin aspart  4 Units Subcutaneous Q4H  ? insulin detemir  14 Units Subcutaneous Q12H  ? lacosamide  75 mg Per Tube BID  ? leptospermum manuka honey  1 application. Topical UD  ? levothyroxine  50 mcg Per Tube QAC breakfast  ? mouth rinse  15 mL Mouth Rinse 10 times per day  ? modafinil  100 mg Per Tube Daily  ? multivitamin  1 tablet Per Tube QHS  ? pantoprazole sodium  40 mg Per Tube Daily  ? predniSONE  50 mg Per Tube Q6H  ? thiamine  100 mg Per Tube Daily  ? vitamin B-12  1,000 mcg Per Tube Daily  ? ? ?Louis Meckel ? ?10/18/2021, 8:36 AM   ?

## 2021-10-19 DIAGNOSIS — I6783 Posterior reversible encephalopathy syndrome: Secondary | ICD-10-CM | POA: Diagnosis not present

## 2021-10-19 DIAGNOSIS — N186 End stage renal disease: Secondary | ICD-10-CM | POA: Diagnosis not present

## 2021-10-19 DIAGNOSIS — J9601 Acute respiratory failure with hypoxia: Secondary | ICD-10-CM | POA: Diagnosis not present

## 2021-10-19 DIAGNOSIS — R4182 Altered mental status, unspecified: Secondary | ICD-10-CM | POA: Diagnosis not present

## 2021-10-19 LAB — GLUCOSE, CAPILLARY
Glucose-Capillary: 447 mg/dL — ABNORMAL HIGH (ref 70–99)
Glucose-Capillary: 439 mg/dL — ABNORMAL HIGH (ref 70–99)
Glucose-Capillary: 458 mg/dL — ABNORMAL HIGH (ref 70–99)
Glucose-Capillary: 488 mg/dL — ABNORMAL HIGH (ref 70–99)
Glucose-Capillary: 469 mg/dL — ABNORMAL HIGH (ref 70–99)
Glucose-Capillary: 438 mg/dL — ABNORMAL HIGH (ref 70–99)
Glucose-Capillary: 454 mg/dL — ABNORMAL HIGH (ref 70–99)
Glucose-Capillary: 479 mg/dL — ABNORMAL HIGH (ref 70–99)
Glucose-Capillary: 421 mg/dL — ABNORMAL HIGH (ref 70–99)
Glucose-Capillary: 307 mg/dL — ABNORMAL HIGH (ref 70–99)
Glucose-Capillary: 275 mg/dL — ABNORMAL HIGH (ref 70–99)
Glucose-Capillary: 185 mg/dL — ABNORMAL HIGH (ref 70–99)
Glucose-Capillary: 150 mg/dL — ABNORMAL HIGH (ref 70–99)
Glucose-Capillary: 160 mg/dL — ABNORMAL HIGH (ref 70–99)
Glucose-Capillary: 110 mg/dL — ABNORMAL HIGH (ref 70–99)
Glucose-Capillary: 127 mg/dL — ABNORMAL HIGH (ref 70–99)
Glucose-Capillary: 127 mg/dL — ABNORMAL HIGH (ref 70–99)
Glucose-Capillary: 173 mg/dL — ABNORMAL HIGH (ref 70–99)
Glucose-Capillary: 178 mg/dL — ABNORMAL HIGH (ref 70–99)

## 2021-10-19 LAB — CBC
HCT: 25.5 % — ABNORMAL LOW (ref 36.0–46.0)
Hemoglobin: 8.9 g/dL — ABNORMAL LOW (ref 12.0–15.0)
MCH: 33.6 pg (ref 26.0–34.0)
MCHC: 34.9 g/dL (ref 30.0–36.0)
MCV: 96.2 fL (ref 80.0–100.0)
Platelets: 273 10*3/uL (ref 150–400)
RBC: 2.65 MIL/uL — ABNORMAL LOW (ref 3.87–5.11)
RDW: 16.7 % — ABNORMAL HIGH (ref 11.5–15.5)
WBC: 8.5 10*3/uL (ref 4.0–10.5)
nRBC: 0 % (ref 0.0–0.2)

## 2021-10-19 LAB — RENAL FUNCTION PANEL
Albumin: 2.4 g/dL — ABNORMAL LOW (ref 3.5–5.0)
Anion gap: 17 — ABNORMAL HIGH (ref 5–15)
BUN: 65 mg/dL — ABNORMAL HIGH (ref 6–20)
CO2: 21 mmol/L — ABNORMAL LOW (ref 22–32)
Calcium: 9 mg/dL (ref 8.9–10.3)
Chloride: 97 mmol/L — ABNORMAL LOW (ref 98–111)
Creatinine, Ser: 4.37 mg/dL — ABNORMAL HIGH (ref 0.44–1.00)
GFR, Estimated: 12 mL/min — ABNORMAL LOW (ref >60)
Glucose, Bld: 457 mg/dL — ABNORMAL HIGH (ref 70–99)
Phosphorus: 6.9 mg/dL — ABNORMAL HIGH (ref 2.5–4.6)
Potassium: 4 mmol/L (ref 3.5–5.1)
Sodium: 135 mmol/L (ref 135–145)

## 2021-10-19 LAB — MAGNESIUM: Magnesium: 2.2 mg/dL (ref 1.7–2.4)

## 2021-10-19 MED ORDER — CHLORHEXIDINE GLUCONATE CLOTH 2 % EX PADS
6.0000 | MEDICATED_PAD | Freq: Every day | CUTANEOUS | Status: DC
  Administered 2021-10-20 – 2021-11-01 (×13): 6 via TOPICAL

## 2021-10-19 MED ORDER — DEXTROSE 50 % IV SOLN: 0.0000 mL | INTRAVENOUS | Status: DC | PRN

## 2021-10-19 MED ORDER — INSULIN ASPART 100 UNIT/ML IJ SOLN
3.0000 [IU] | INTRAMUSCULAR | Status: DC
  Administered 2021-10-19 – 2021-10-20 (×4): 6 [IU] via SUBCUTANEOUS
  Administered 2021-10-20: 3 [IU] via SUBCUTANEOUS
  Administered 2021-10-20 – 2021-10-21 (×3): 6 [IU] via SUBCUTANEOUS
  Administered 2021-10-21: 9 [IU] via SUBCUTANEOUS
  Administered 2021-10-21: 6 [IU] via SUBCUTANEOUS

## 2021-10-19 MED ORDER — INSULIN DETEMIR 100 UNIT/ML ~~LOC~~ SOLN
20.0000 [IU] | Freq: Two times a day (BID) | SUBCUTANEOUS | Status: DC
  Administered 2021-10-19 – 2021-10-21 (×5): 20 [IU] via SUBCUTANEOUS
  Filled 2021-10-19 (×6): qty 0.2

## 2021-10-19 MED ORDER — INSULIN REGULAR(HUMAN) IN NACL 100-0.9 UT/100ML-% IV SOLN
INTRAVENOUS | Status: DC
  Administered 2021-10-19: 12 [IU]/h via INTRAVENOUS
  Administered 2021-10-19: 30 [IU]/h via INTRAVENOUS
  Filled 2021-10-19 (×2): qty 100

## 2021-10-19 MED ORDER — NICARDIPINE HCL IN NACL 20-0.86 MG/200ML-% IV SOLN
3.0000 mg/h | INTRAVENOUS | Status: DC
  Administered 2021-10-19: 7.5 mg/h via INTRAVENOUS
  Administered 2021-10-19: 3 mg/h via INTRAVENOUS
  Filled 2021-10-19 (×3): qty 200

## 2021-10-19 MED ORDER — HYDRALAZINE HCL 50 MG PO TABS
100.0000 mg | ORAL_TABLET | Freq: Two times a day (BID) | ORAL | Status: DC
  Administered 2021-10-19 – 2021-10-24 (×11): 100 mg
  Filled 2021-10-19 (×11): qty 2

## 2021-10-19 MED ORDER — ENOXAPARIN SODIUM 30 MG/0.3ML IJ SOSY
30.0000 mg | PREFILLED_SYRINGE | INTRAMUSCULAR | Status: DC
  Administered 2021-10-19 – 2021-10-22 (×3): 30 mg via SUBCUTANEOUS
  Filled 2021-10-19 (×4): qty 0.3

## 2021-10-19 MED ORDER — DOCUSATE SODIUM 50 MG/5ML PO LIQD: 100.0000 mg | Freq: Two times a day (BID) | ORAL | Status: DC

## 2021-10-19 MED ORDER — INSULIN ASPART 100 UNIT/ML IJ SOLN
7.0000 [IU] | INTRAMUSCULAR | Status: DC
  Administered 2021-10-19 – 2021-10-21 (×10): 7 [IU] via SUBCUTANEOUS

## 2021-10-19 MED ORDER — CHLORHEXIDINE GLUCONATE 0.12% ORAL RINSE (MEDLINE KIT)
15.0000 mL | Freq: Two times a day (BID) | OROMUCOSAL | Status: DC
  Administered 2021-10-19 – 2021-10-31 (×22): 15 mL via OROMUCOSAL

## 2021-10-19 MED ORDER — DEXMEDETOMIDINE HCL IN NACL 400 MCG/100ML IV SOLN
0.0000 ug/kg/h | INTRAVENOUS | Status: AC
  Administered 2021-10-19: 0.4 ug/kg/h via INTRAVENOUS
  Administered 2021-10-20: 0.7 ug/kg/h via INTRAVENOUS
  Administered 2021-10-20 – 2021-10-22 (×8): 1.2 ug/kg/h via INTRAVENOUS
  Filled 2021-10-19 (×12): qty 100

## 2021-10-19 MED ORDER — INSULIN ASPART 100 UNIT/ML IJ SOLN: 0.0000 [IU] | INTRAMUSCULAR | Status: DC

## 2021-10-19 MED ORDER — POLYETHYLENE GLYCOL 3350 17 G PO PACK
17.0000 g | PACK | Freq: Every day | ORAL | Status: DC
  Administered 2021-10-20: 17 g
  Filled 2021-10-19: qty 1

## 2021-10-19 MED ORDER — DEXMEDETOMIDINE HCL IN NACL 400 MCG/100ML IV SOLN: 0.0000 ug/kg/h | INTRAVENOUS | Status: DC

## 2021-10-19 NOTE — Progress Notes (Signed)
? ?NAME:  Melanie Hall, MRN:  [id number], DOB:  19-Dec-1970, LOS: 15 ?ADMISSION DATE:  10/03/2021, CONSULTATION DATE:  09/21/2021 ?REFERRING MD:  Dr. Roderic Palau, ER, CHIEF COMPLAINT:  AMS  ? ?History of Present Illness:  ?51 yo female former smoker with hx of ESRD on HD was found at her home by patient transport driver unresponsive.  Pt's neighbor noted that she was tapping her hands for about a minute, and then stopped.  She has snoring and drooling.  No improvement after narcan.  CBG normal.  Noted to have BP 215/96.  Intubated in [facility] for airway protection, given keppra load, started on diprivan, and started on antibiotics.  CT head negative for acute findings.  Treated for hyperkalemia.  Pt's daughter report she had similar episode on 09/24/21 and EMS was called to the house, but episode resolve and patient declined hospital evaluation.  No prior history of seizures or known head injury.  ER team d/w case with neurology and advised to transfer to [facility] for EEG monitoring and further neurology assessment. ? ?Hx from medical team, patients daughter at bedside, and medical records. ? ?Pertinent  Medical History  ?CHF, Anemia, OA, CAD, DM type 2, ESRD on HD, HLD, HTN, CVA ? ?Significant Hospital Events: ?Including procedures, antibiotic start and stop dates in addition to other pertinent events   ?3/29 present to [facility], intubated, transfer to [facility] ?3/30 MRI Brain consistent with PRES ?4/3 off sedation, not waking up ?4/4 transitioned off insulin gtt to SSI ?4/5 [place] conversation; see IPAL note ?4/8 MRI with no acute findings ?4/11 trial of extubation failed, she was reintubated, bedside tracheostomy was attempted but failed ?4/12 CT neck was done, negative for acute findings ? ?Interim History / Subjective:  ?Patient was very agitated overnight, requiring Precedex infusion and fentanyl IV pushes ?She started having bleeding from attempted tracheostomy site but her H&H remained stable, thrombin pad was applied ? ?Objective   ?Blood  pressure 121/62, pulse 63, temperature (!) 96.7 ?F (35.9 ?C), temperature source Axillary, resp. rate (!) 27, height '5\' 3"'$  (1.6 m), weight 76.5 kg, last menstrual period 12/17/2016, SpO2 99 %. ?   ?Vent Mode: PRVC ?FiO2 (%):  [30 %-40 %] 40 % ?Set Rate:  [30 bmp] 30 bmp ?Vt Set:  [410 mL] 410 mL ?PEEP:  [5 cmH20] 5 cmH20 ?Pressure Support:  [10 cmH20] 10 cmH20 ?Plateau Pressure:  [19 cmH20] 19 cmH20  ? ?Intake/Output Summary (Last 24 hours) at 10/19/2021 0940 ?Last data filed at 10/19/2021 0800 ?Gross per 24 hour  ?Intake 2245.79 ml  ?Output 3000 ml  ?Net -754.21 ml  ? ?Filed Weights  ? 10/16/21 0339 10/18/21 0645 10/18/21 1046  ?Weight: 79.9 kg 79.9 kg 76.5 kg  ? ?Physical Exam: ?General: Crtitically ill-appearing female, orally intubated, agitated and restless in the bed ?HEENT: [place]/AT, eyes anicteric.  ETT and OGT in place ?Neuro: Eyes closed, does not open, withdrawing in all 4 extremities with painful stimuli, positive cough and gag.  Remains restless ?Chest: Coarse breath sounds, no wheezes or rhonchi ?Heart: Regular rate and rhythm, no murmurs or gallops ?Abdomen: Soft, nontender, nondistended, bowel sounds present ?Skin: No rash ? ?Resolved Hospital Problem list   ? ? ?Assessment & Plan:  ? ?PRES syndrome with associated status epilepticus ?Acute Metabolic encephalopathy secondary to PRES ?Initial MRI was consistent with PRES and repeat MRI after few days showed resolution ?Patient remained encephalopathic and agitated ?Continue Provigil ?Continue seizure precautions ?Continue Vimpat ? ?Acute respiratory failure secondary to probable aspiration pneumonia ?  Patient remained afebrile ?Respiratory culture is growing oral flora ?Extubation was attempted yesterday, she failed, had to be reintubated because of visible aspiration ?Percutaneous tracheostomy was attempted at bedside but failed ?ENT is consulted for tracheostomy, recommend planning for tracheostomy in few days ?CT neck was done, negative for acute  findings ?Continue lung protective ventilation  ?VAP prevention in place  ? ?End-stage renal disease on hemodialysis ?Hyponatremia, resolved ?Hyperkalemia, improved ?Hyperphosphatemia ?Continue intermittent hemodialysis ?Serum potassium has normalized after hemodialysis ?Serum sodium is back to normal ?Now receiving dialysis ?Replace electrolytes, trend ? ?Chronic HFrEF  ?Coronary artery disease/hyperlipidemia ?Continue carvedilol, hydralazine, aspirin, statin, Zetia ? ?Type 2 diabetes with hyperglycemia ?Blood sugar went to 400s last night, in the setting of steroid ?Started on IV insulin infusion ?Long-acting insulin and sliding scale was stopped ?Once blood sugars are below 200 we will transition back to long-acting insulin and sliding scale ? ?Hypothyroidism ?Continue home Synthroid ? ?Unstageable sacral pressure ulcer, POA ?Continue wound care ? ?Best Practice (right click and "Reselect all SmartList Selections" daily)  ? ?Diet/type: tubefeeds ?DVT prophylaxis: prophylactic heparin  ?GI prophylaxis: PPI ?Lines: N/A ?Foley:  N/A ?Code Status:  full code ? ?Last date of multidisciplinary goals of care discussion [4/11 : Please see plan of care note  ? ?Total critical care time: 32 minutes ? ?Performed by: Jacky Kindle ?  ?Critical care time was exclusive of separately billable procedures and treating other patients. ?  ?Critical care was necessary to treat or prevent imminent or life-threatening deterioration. ?  ?Critical care was time spent personally by me on the following activities: development of treatment plan with patient and/or surrogate as well as nursing, discussions with consultants, evaluation of patient's response to treatment, examination of patient, obtaining history from patient or surrogate, ordering and performing treatments and interventions, ordering and review of laboratory studies, ordering and review of radiographic studies, pulse oximetry and re-evaluation of patient's condition. ?   ?Jacky Kindle MD ?[facility] ?See Amion for pager ?If no response to pager, please call [phone] until 7pm ?After 7pm, Please call E-link [phone] ? ?

## 2021-10-19 NOTE — H&P (View-Only) (Signed)
Reason for Consult: Tracheostomy consult ?Referring Physician: Jacky Kindle, MD ? ?Melanie Hall is an 51 y.o. female.  ?HPI: Patient with altered mental status has been intubated and then was recently extubated and the attempt was made at percutaneous tracheostomy but this was not successful.  Consulted for tracheostomy.  Was seen by our practice couple of years ago.  Nasal fracture. ? ?Past Medical History:  ?Diagnosis Date  ? Anemia   ? Arthritis   ? CAD (coronary artery disease)   ? S/p late presenting Inf STEMI 10/2019 >> PCI: DES x 2 to RCA // Myoview 8/22: EF 54, no ischemia or infarction; low risk  ? Cataracts, bilateral   ? surgery to remove  ? CATARACTS, BILATERAL 07/02/2007  ? Qualifier: Diagnosis of  By: Isla Pence    ? Closed fracture of left femur ([place]) 10/28/2018  ? Closed fracture of right ankle 11/06/2017  ? Diabetes mellitus   ? type 2  ? Emphysematous cystitis 08/26/2018  ? ESRD on hemodialysis ([place])   ? MWF - in [place]  ? GERD (gastroesophageal reflux disease)   ? Hyperlipidemia   ? Hypertension   ? IRREGULAR MENSES 09/14/2009  ? Qualifier: Diagnosis of  By: Hassell Done FNP, Tori Milks    ? Ischemic cardiomyopathy   ? Echocardiogram 10/19/2019: EF 40-45, inf WMA, Gr 1 DD, Lg L pl Eff // EF 54 by Myoview in 8/22  ? PARONYCHIA, RIGHT GREAT TOE 07/30/2008  ? Qualifier: Diagnosis of  By: Hassell Done FNP, Tori Milks    ? Pressure ulcer 09/10/2019  ? Right arm weakness 08/08/2019  ? STEMI involving right coronary artery ([place]) 10/18/2019  ? Stroke [facility]) 10/2019  ? SVD (spontaneous vaginal delivery)   ? x 4  ? Vaginosis 08/26/2018  ? Weakness of both lower extremities   ? ? ?Past Surgical History:  ?Procedure Laterality Date  ? A/V FISTULAGRAM Left 01/12/2020  ? Procedure: A/V FISTULAGRAM;  Surgeon: Serafina Mitchell, MD;  Location: [place] [facility];  Service: Cardiovascular;  Laterality: Left;  ? A/V FISTULAGRAM N/A 03/17/2020  ? Procedure: A/V FISTULAGRAM - Left Arm;  Surgeon: Marty Heck, MD;   Location: [place] [facility];  Service: Cardiovascular;  Laterality: N/A;  ? A/V FISTULAGRAM Left 06/23/2020  ? Procedure: A/V FISTULAGRAM;  Surgeon: Marty Heck, MD;  Location: [place] [facility];  Service: Cardiovascular;  Laterality: Left;  ? ANKLE FUSION Left 06/01/2020  ? Procedure: LEFT ANKLE FUSION;  Surgeon: Newt Minion, MD;  Location: [place];  Service: Orthopedics;  Laterality: Left;  ? AV FISTULA PLACEMENT Left 08/17/2019  ? Procedure: LEFT BRACHIAL CEPHALIC ARTERIOVENOUS (AV) FISTULA;  Surgeon: Angelia Mould, MD;  Location: [place];  Service: Vascular;  Laterality: Left;  ? BIOPSY  02/28/2021  ? Procedure: BIOPSY;  Surgeon: Harvel Quale, MD;  [facility];  Service: Gastroenterology;;  duodenum ?gastric ?esophagus ?colon  ? COLONOSCOPY WITH PROPOFOL N/A 02/28/2021  ? Procedure: COLONOSCOPY WITH PROPOFOL;  Surgeon: Harvel Quale, MD;  [facility];  Service: Gastroenterology;  Laterality: N/A;  11:00  ? CORONARY STENT INTERVENTION N/A 10/18/2019  ? Procedure: CORONARY STENT INTERVENTION;  Surgeon: Sherren Mocha, MD;  Location: [place] [facility];  Service: Cardiovascular;  Laterality: N/A;  ? CORONARY/GRAFT ACUTE MI REVASCULARIZATION N/A 10/18/2019  ? Procedure: Coronary/Graft Acute MI Revascularization;  Surgeon: Sherren Mocha, MD;  Location: [place] [facility];  Service: Cardiovascular;  Laterality: N/A;  ? ESOPHAGOGASTRODUODENOSCOPY (EGD) WITH PROPOFOL N/A 02/28/2021  ?  Procedure: ESOPHAGOGASTRODUODENOSCOPY (EGD) WITH PROPOFOL;  Surgeon: Harvel Quale, MD;  [facility];  Service: Gastroenterology;  Laterality: N/A;  ? EYE SURGERY Bilateral   ? cataracts removed  ? FEMUR IM NAIL Left 10/28/2018  ? Procedure: RETROGRADE FEMORAL NAILING;  Surgeon: Meredith Pel, MD;  Location: [place];  Service: Orthopedics;  Laterality: Left;  ? IM NAILING FEMORAL SHAFT RETROGRADE Left 10/28/2018  ? INTRAVASCULAR ULTRASOUND/IVUS N/A  10/18/2019  ? Procedure: Intravascular Ultrasound/IVUS;  Surgeon: Sherren Mocha, MD;  Location: [place] [facility];  Service: Cardiovascular;  Laterality: N/A;  ? IR FLUORO GUIDE CV LINE RIGHT  08/11/2019  ? IR THORACENTESIS ASP PLEURAL SPACE W/IMG GUIDE  11/12/2019  ? IR US GUIDE VASC ACCESS RIGHT  08/11/2019  ? KNEE SURGERY Left   ? LEFT HEART CATH AND CORONARY ANGIOGRAPHY N/A 10/18/2019  ? Procedure: LEFT HEART CATH AND CORONARY ANGIOGRAPHY;  Surgeon: Sherren Mocha, MD;  Location: [place] [facility];  Service: Cardiovascular;  Laterality: N/A;  ? PERIPHERAL VASCULAR BALLOON ANGIOPLASTY Left 01/12/2020  ? Procedure: PERIPHERAL VASCULAR BALLOON ANGIOPLASTY;  Surgeon: Serafina Mitchell, MD;  Location: [place] [facility];  Service: Cardiovascular;  Laterality: Left;  AVF  ? PERIPHERAL VASCULAR BALLOON ANGIOPLASTY Left 03/17/2020  ? Procedure: PERIPHERAL VASCULAR BALLOON ANGIOPLASTY;  Surgeon: Marty Heck, MD;  Location: [place] [facility];  Service: Cardiovascular;  Laterality: Left;  AVF  ? PERIPHERAL VASCULAR BALLOON ANGIOPLASTY Left 05/05/2020  ? Procedure: PERIPHERAL VASCULAR BALLOON ANGIOPLASTY;  Surgeon: Marty Heck, MD;  Location: [place] [facility];  Service: Cardiovascular;  Laterality: Left;  arm fistula  ? RADIOLOGY WITH ANESTHESIA N/A 09/15/2019  ? Procedure: [facility] AND LUMBER LOWER BACK PAIN;  Surgeon: Radiologist, Medication, MD;  Location: [place];  Service: Radiology;  Laterality: N/A;  ? TUBAL LIGATION    ? ? ?Family History  ?Problem Relation Age of Onset  ? Diabetes Mother   ? Hypertension Mother   ? Bipolar disorder Mother   ? Brain cancer Maternal Aunt   ? Heart disease Maternal Grandmother   ? Diabetes Maternal Grandmother   ? Breast cancer Maternal Aunt   ? Depression Father   ?     Committed suicide  ? Diabetes Sister   ? ? ?Social History:  reports that she quit smoking about 26 years ago. Her smoking use included cigarettes. She has a 0.50 pack-year smoking history. She has never used  smokeless tobacco. She reports that she does not drink alcohol and does not use drugs. ? ?Allergies:  ?Allergies  ?Allergen Reactions  ? Contrast Media [Iodinated Contrast Media] Nausea And Vomiting  ?  Treated with Benadryl & Solumedrol  ? Penicillins Other (See Comments)  ?  Don't want to take PCN due to family history   ? ? ?Medications: Reviewed ? ?Results for orders placed or performed during the hospital encounter of 09/18/2021 (from the past 48 hour(s))  ?Glucose, capillary     Status: Abnormal  ? Collection Time: 10/17/21 12:45 PM  ?Result Value Ref Range  ? Glucose-Capillary 104 (H) 70 - 99 mg/dL  ?  Comment: Glucose reference range applies only to samples taken after fasting for at least 8 hours.  ?Hemoglobin and hematocrit, blood     Status: Abnormal  ? Collection Time: 10/17/21  2:44 PM  ?Result Value Ref Range  ? Hemoglobin 7.7 (L) 12.0 - 15.0 g/dL  ? HCT 23.4 (L) 36.0 - 46.0 %  ?  Comment: Performed at [facility]  Hospital Lab, [place] [address] [place], [place] [address]  ?Glucose, capillary     Status: Abnormal  ? Collection Time: 10/17/21  3:03 PM  ?Result Value Ref Range  ? Glucose-Capillary 139 (H) 70 - 99 mg/dL  ?  Comment: Glucose reference range applies only to samples taken after fasting for at least 8 hours.  ?Glucose, capillary     Status: Abnormal  ? Collection Time: 10/17/21  7:59 PM  ?Result Value Ref Range  ? Glucose-Capillary 240 (H) 70 - 99 mg/dL  ?  Comment: Glucose reference range applies only to samples taken after fasting for at least 8 hours.  ?Glucose, capillary     Status: Abnormal  ? Collection Time: 10/17/21 11:15 PM  ?Result Value Ref Range  ? Glucose-Capillary 178 (H) 70 - 99 mg/dL  ?  Comment: Glucose reference range applies only to samples taken after fasting for at least 8 hours.  ?CBC with Differential/Platelet     Status: Abnormal  ? Collection Time: 10/18/21  2:08 AM  ?Result Value Ref Range  ? WBC 10.8 (H) 4.0 - 10.5 K/uL  ? RBC 2.63 (L) 3.87 - 5.11 MIL/uL  ? Hemoglobin 8.8 (L)  12.0 - 15.0 g/dL  ? HCT 25.6 (L) 36.0 - 46.0 %  ? MCV 97.3 80.0 - 100.0 fL  ? MCH 33.5 26.0 - 34.0 pg  ? MCHC 34.4 30.0 - 36.0 g/dL  ? RDW 17.4 (H) 11.5 - 15.5 %  ? Platelets 260 150 - 400 K/uL  ? nRBC 0.0

## 2021-10-19 NOTE — Progress Notes (Signed)
eLink Physician-Brief Progress Note ?Patient Name: Melanie Hall ?DOB: Dec 25, 1970 ?MRN: [id number] ? ? ?Date of Service ? 10/19/2021  ?HPI/Events of Note ? Hyperglycemia - Blood glucose = 447.  ?eICU Interventions ? Plan: ?D/C Levemir and Novolog SSI. ?Insulin IV infusion per End[facility] protocol.  ? ? ? ?Intervention Category ?Major Interventions: Hyperglycemia - active titration of insulin therapy ? ?Brennin Durfee Cornelia Copa ?10/19/2021, 3:33 AM ?

## 2021-10-19 NOTE — Progress Notes (Signed)
PT Cancellation Note ? ?Patient Details ?Name: Melanie Hall ?MRN: [id number] ?DOB: [birth year] ? ? ?Cancelled Treatment:    Reason Eval/Treat Not Completed: Patient not medically ready. Pt not following commands and intermittently agitated. Will sign off for now. Please re-order when appropriate.  ? ? ?Shary Decamp [facility] ?10/19/2021, 3:12 PM ?[facility] PT ?Acute Rehabilitation Services ?Office [phone] ? ?

## 2021-10-19 NOTE — Progress Notes (Signed)
?Melanie Hall ?Progress Note  ? ?Assessment/ Plan:   ?OP HD: TTS [facility] ?  3h 1mn  75.5kg  2/2.5 bath  400 bfr   Hep none LUE AVF ? - 3/30 here > hep B Ag negative and hep B Ab's low/ not protective ? - mircera 200 q2 wks ? - venofer 50 weekly ? - rocaltrol 0.25 tiw ?  ?CXR 3/30 - no active disease ?  ?Assessment/ Plan:  ?AMS - found unresponsive at home. Had PRES and seizures. MS remains poor Neurology following.  ?VDRF- for airway protection.  She hasn't been able to be extubated. Going for trach per notes ?ESRD - on TTS [facility]. Early AM Sunday HD, then Wednesday -  will need next Fri/Sat.  Placement for a patient on dialysis with a trach is exceedingly difficult as everyone is aware ?Volume - up 5-6 kg peak here. Increased UF as we can ?DM2 - per pmd-  now on insulin drip  ?Possible sepsis - sp short course IV abx. BCx x2 NGTD ?Anemia - Hb 8-9s. Was on esa/ IV fe at OP unit. Cont darbe 150 ug q Friday and weekly IV Fe here.  ?CKD-BMM: CCa stable. Cont vdra tiw.  ?Nutrition: getting TF"s at 50 cc/hr ?Hyperkalemia -  on 4/12 due to delayed treatment mostly -  also transfusion-  cont to follow  ? ?Subjective:   ? ?Completed HD yesterday AM -  removed 3000 tolerated well-  has been hypertensive and hyperglycemic overnight-  on cardene and insulin drips   ? ?Objective:   ?BP (!) 141/70 (BP Location: Right Leg)   Pulse 69   Temp (!) 96.7 ?F (35.9 ?C) (Axillary)   Resp (!) 0   Ht _0  (1.6 m)   Wt 76.5 kg   LMP 12/17/2016   SpO2 100%   BMI 29.88 kg/m?  ? ?Physical Exam: ?GTDS:[id number] in restraints, agitated ?CVS: RRR ?Resp: clear anteriorly ?Abd: soft ?Ext: no LE edema ?ACCESS: + T/B AVF-   ?Labs: ?BMET ?Recent Labs  ?Lab 10/14/21 ?0609 10/15/21 ?0605 10/16/21 ?0310 10/17/21 ?0145 10/18/21 ?0262004/12/23 ?0355904/13/23 ?07416 ?NA 138 136 134* 133* 131* 135 135  ?K 5.1 3.5 4.8 5.2* 6.7* 4.0 4.0  ?CL 99 97* 95* 95* 91* 93* 97*  ?CO2 _1 18* 24 21*  ?GLUCOSE 104*  69* 225* 142* 232* 253* 457*  ?BUN 50* 19 57* 87* 112* 61* 65*  ?CREATININE 3.97* 1.69* 3.98* 5.46* 6.62* 3.93* 4.37*  ?CALCIUM 8.6* 9.0 8.4* 8.2* 8.7* 9.0 9.0  ?PHOS 6.2* 2.4* 6.4*  --   --  4.9* 6.9*  ? ?CBC ?Recent Labs  ?Lab 10/16/21 ?0310 10/17/21 ?0145 10/17/21 ?1444 10/18/21 ?0208 10/18/21 ?2152 10/19/21 ?03845 ?WBC 12.4* 11.7*  --  10.8*  --  8.5  ?NEUTROABS  --   --   --  9.5*  --   --   ?HGB 8.1* 6.9* 7.7* 8.8* 8.6* 8.9*  ?HCT 24.8* 20.8* 23.4* 25.6* 25.7* 25.5*  ?MCV 102.1* 101.5*  --  97.3  --  96.2  ?PLT 198 213  --  260  --  273  ? ? ?  ?Medications:   ? ? aspirin  81 mg Per Tube Daily  ? atorvastatin  80 mg Per Tube q1800  ? calcitRIOL  0.25 mcg Per Tube Q T,Th,Sat-1800  ? carvedilol  25 mg Per Tube BID WC  ? chlorhexidine gluconate (MEDLINE KIT)  15 mL Mouth Rinse BID  ?  Chlorhexidine Gluconate Cloth  6 each Topical Daily  ? cholestyramine  4 g Per Tube TID  ? darbepoetin (ARANESP) injection - DIALYSIS  150 mcg Intravenous Q Thu-HD  ? enoxaparin (LOVENOX) injection  30 mg Subcutaneous Q24H  ? ezetimibe  10 mg Per Tube Daily  ? feeding supplement (PROSource TF)  45 mL Per Tube QID  ? fiber  1 packet Per Tube TID  ? folic acid  1 mg Per Tube Daily  ? hydrALAZINE  100 mg Per Tube BID  ? lacosamide  75 mg Per Tube BID  ? leptospermum manuka honey  1 application. Topical UD  ? levothyroxine  50 mcg Per Tube QAC breakfast  ? mouth rinse  15 mL Mouth Rinse 10 times per day  ? modafinil  100 mg Per Tube Daily  ? multivitamin  1 tablet Per Tube QHS  ? pantoprazole sodium  40 mg Per Tube Daily  ? thiamine  100 mg Per Tube Daily  ? vitamin B-12  1,000 mcg Per Tube Daily  ? ? ?Louis Meckel ? ?10/19/2021, 8:30 AM   ?

## 2021-10-19 NOTE — Consult Note (Signed)
Reason for Consult: Tracheostomy consult ?Referring Physician: Jacky Kindle, MD ? ?Melanie Hall is an 51 y.o. female.  ?HPI: Patient with altered mental status has been intubated and then was recently extubated and the attempt was made at percutaneous tracheostomy but this was not successful.  Consulted for tracheostomy.  Was seen by our practice couple of years ago.  Nasal fracture. ? ?Past Medical History:  ?Diagnosis Date  ? Anemia   ? Arthritis   ? CAD (coronary artery disease)   ? S/p late presenting Inf STEMI 10/2019 >> PCI: DES x 2 to RCA // Myoview 8/22: EF 54, no ischemia or infarction; low risk  ? Cataracts, bilateral   ? surgery to remove  ? CATARACTS, BILATERAL 07/02/2007  ? Qualifier: Diagnosis of  By: Isla Pence    ? Closed fracture of left femur ([place]) 10/28/2018  ? Closed fracture of right ankle 11/06/2017  ? Diabetes mellitus   ? type 2  ? Emphysematous cystitis 08/26/2018  ? ESRD on hemodialysis ([place])   ? MWF - in [place]  ? GERD (gastroesophageal reflux disease)   ? Hyperlipidemia   ? Hypertension   ? IRREGULAR MENSES 09/14/2009  ? Qualifier: Diagnosis of  By: Hassell Done FNP, Tori Milks    ? Ischemic cardiomyopathy   ? Echocardiogram 10/19/2019: EF 40-45, inf WMA, Gr 1 DD, Lg L pl Eff // EF 54 by Myoview in 8/22  ? PARONYCHIA, RIGHT GREAT TOE 07/30/2008  ? Qualifier: Diagnosis of  By: Hassell Done FNP, Tori Milks    ? Pressure ulcer 09/10/2019  ? Right arm weakness 08/08/2019  ? STEMI involving right coronary artery ([place]) 10/18/2019  ? Stroke [facility]) 10/2019  ? SVD (spontaneous vaginal delivery)   ? x 4  ? Vaginosis 08/26/2018  ? Weakness of both lower extremities   ? ? ?Past Surgical History:  ?Procedure Laterality Date  ? A/V FISTULAGRAM Left 01/12/2020  ? Procedure: A/V FISTULAGRAM;  Surgeon: Serafina Mitchell, MD;  Location: [place] [facility];  Service: Cardiovascular;  Laterality: Left;  ? A/V FISTULAGRAM N/A 03/17/2020  ? Procedure: A/V FISTULAGRAM - Left Arm;  Surgeon: Marty Heck, MD;   Location: [place] [facility];  Service: Cardiovascular;  Laterality: N/A;  ? A/V FISTULAGRAM Left 06/23/2020  ? Procedure: A/V FISTULAGRAM;  Surgeon: Marty Heck, MD;  Location: [place] [facility];  Service: Cardiovascular;  Laterality: Left;  ? ANKLE FUSION Left 06/01/2020  ? Procedure: LEFT ANKLE FUSION;  Surgeon: Newt Minion, MD;  Location: [place];  Service: Orthopedics;  Laterality: Left;  ? AV FISTULA PLACEMENT Left 08/17/2019  ? Procedure: LEFT BRACHIAL CEPHALIC ARTERIOVENOUS (AV) FISTULA;  Surgeon: Angelia Mould, MD;  Location: [place];  Service: Vascular;  Laterality: Left;  ? BIOPSY  02/28/2021  ? Procedure: BIOPSY;  Surgeon: Harvel Quale, MD;  [facility];  Service: Gastroenterology;;  duodenum ?gastric ?esophagus ?colon  ? COLONOSCOPY WITH PROPOFOL N/A 02/28/2021  ? Procedure: COLONOSCOPY WITH PROPOFOL;  Surgeon: Harvel Quale, MD;  [facility];  Service: Gastroenterology;  Laterality: N/A;  11:00  ? CORONARY STENT INTERVENTION N/A 10/18/2019  ? Procedure: CORONARY STENT INTERVENTION;  Surgeon: Sherren Mocha, MD;  Location: [place] [facility];  Service: Cardiovascular;  Laterality: N/A;  ? CORONARY/GRAFT ACUTE MI REVASCULARIZATION N/A 10/18/2019  ? Procedure: Coronary/Graft Acute MI Revascularization;  Surgeon: Sherren Mocha, MD;  Location: [place] [facility];  Service: Cardiovascular;  Laterality: N/A;  ? ESOPHAGOGASTRODUODENOSCOPY (EGD) WITH PROPOFOL N/A 02/28/2021  ?  Procedure: ESOPHAGOGASTRODUODENOSCOPY (EGD) WITH PROPOFOL;  Surgeon: Harvel Quale, MD;  [facility];  Service: Gastroenterology;  Laterality: N/A;  ? EYE SURGERY Bilateral   ? cataracts removed  ? FEMUR IM NAIL Left 10/28/2018  ? Procedure: RETROGRADE FEMORAL NAILING;  Surgeon: Meredith Pel, MD;  Location: [place];  Service: Orthopedics;  Laterality: Left;  ? IM NAILING FEMORAL SHAFT RETROGRADE Left 10/28/2018  ? INTRAVASCULAR ULTRASOUND/IVUS N/A  10/18/2019  ? Procedure: Intravascular Ultrasound/IVUS;  Surgeon: Sherren Mocha, MD;  Location: [place] [facility];  Service: Cardiovascular;  Laterality: N/A;  ? IR FLUORO GUIDE CV LINE RIGHT  08/11/2019  ? IR THORACENTESIS ASP PLEURAL SPACE W/IMG GUIDE  11/12/2019  ? IR US GUIDE VASC ACCESS RIGHT  08/11/2019  ? KNEE SURGERY Left   ? LEFT HEART CATH AND CORONARY ANGIOGRAPHY N/A 10/18/2019  ? Procedure: LEFT HEART CATH AND CORONARY ANGIOGRAPHY;  Surgeon: Sherren Mocha, MD;  Location: [place] [facility];  Service: Cardiovascular;  Laterality: N/A;  ? PERIPHERAL VASCULAR BALLOON ANGIOPLASTY Left 01/12/2020  ? Procedure: PERIPHERAL VASCULAR BALLOON ANGIOPLASTY;  Surgeon: Serafina Mitchell, MD;  Location: [place] [facility];  Service: Cardiovascular;  Laterality: Left;  AVF  ? PERIPHERAL VASCULAR BALLOON ANGIOPLASTY Left 03/17/2020  ? Procedure: PERIPHERAL VASCULAR BALLOON ANGIOPLASTY;  Surgeon: Marty Heck, MD;  Location: [place] [facility];  Service: Cardiovascular;  Laterality: Left;  AVF  ? PERIPHERAL VASCULAR BALLOON ANGIOPLASTY Left 05/05/2020  ? Procedure: PERIPHERAL VASCULAR BALLOON ANGIOPLASTY;  Surgeon: Marty Heck, MD;  Location: [place] [facility];  Service: Cardiovascular;  Laterality: Left;  arm fistula  ? RADIOLOGY WITH ANESTHESIA N/A 09/15/2019  ? Procedure: [facility] AND LUMBER LOWER BACK PAIN;  Surgeon: Radiologist, Medication, MD;  Location: [place];  Service: Radiology;  Laterality: N/A;  ? TUBAL LIGATION    ? ? ?Family History  ?Problem Relation Age of Onset  ? Diabetes Mother   ? Hypertension Mother   ? Bipolar disorder Mother   ? Brain cancer Maternal Aunt   ? Heart disease Maternal Grandmother   ? Diabetes Maternal Grandmother   ? Breast cancer Maternal Aunt   ? Depression Father   ?     Committed suicide  ? Diabetes Sister   ? ? ?Social History:  reports that she quit smoking about 26 years ago. Her smoking use included cigarettes. She has a 0.50 pack-year smoking history. She has never used  smokeless tobacco. She reports that she does not drink alcohol and does not use drugs. ? ?Allergies:  ?Allergies  ?Allergen Reactions  ? Contrast Media [Iodinated Contrast Media] Nausea And Vomiting  ?  Treated with Benadryl & Solumedrol  ? Penicillins Other (See Comments)  ?  Don't want to take PCN due to family history   ? ? ?Medications: Reviewed ? ?Results for orders placed or performed during the hospital encounter of 10/01/2021 (from the past 48 hour(s))  ?Glucose, capillary     Status: Abnormal  ? Collection Time: 10/17/21 12:45 PM  ?Result Value Ref Range  ? Glucose-Capillary 104 (H) 70 - 99 mg/dL  ?  Comment: Glucose reference range applies only to samples taken after fasting for at least 8 hours.  ?Hemoglobin and hematocrit, blood     Status: Abnormal  ? Collection Time: 10/17/21  2:44 PM  ?Result Value Ref Range  ? Hemoglobin 7.7 (L) 12.0 - 15.0 g/dL  ? HCT 23.4 (L) 36.0 - 46.0 %  ?  Comment: Performed at [facility]  Hospital Lab, [place] [address] [place], [place] [address]  ?Glucose, capillary     Status: Abnormal  ? Collection Time: 10/17/21  3:03 PM  ?Result Value Ref Range  ? Glucose-Capillary 139 (H) 70 - 99 mg/dL  ?  Comment: Glucose reference range applies only to samples taken after fasting for at least 8 hours.  ?Glucose, capillary     Status: Abnormal  ? Collection Time: 10/17/21  7:59 PM  ?Result Value Ref Range  ? Glucose-Capillary 240 (H) 70 - 99 mg/dL  ?  Comment: Glucose reference range applies only to samples taken after fasting for at least 8 hours.  ?Glucose, capillary     Status: Abnormal  ? Collection Time: 10/17/21 11:15 PM  ?Result Value Ref Range  ? Glucose-Capillary 178 (H) 70 - 99 mg/dL  ?  Comment: Glucose reference range applies only to samples taken after fasting for at least 8 hours.  ?CBC with Differential/Platelet     Status: Abnormal  ? Collection Time: 10/18/21  2:08 AM  ?Result Value Ref Range  ? WBC 10.8 (H) 4.0 - 10.5 K/uL  ? RBC 2.63 (L) 3.87 - 5.11 MIL/uL  ? Hemoglobin 8.8 (L)  12.0 - 15.0 g/dL  ? HCT 25.6 (L) 36.0 - 46.0 %  ? MCV 97.3 80.0 - 100.0 fL  ? MCH 33.5 26.0 - 34.0 pg  ? MCHC 34.4 30.0 - 36.0 g/dL  ? RDW 17.4 (H) 11.5 - 15.5 %  ? Platelets 260 150 - 400 K/uL  ? nRBC 0.0

## 2021-10-19 NOTE — Progress Notes (Signed)
eLink Physician-Brief Progress Note ?Patient Name: Melanie Hall ?DOB: 04/21/1971 ?MRN: [id number] ? ? ?Date of Service ? 10/19/2021  ?HPI/Events of Note ? Multiple issues: 1. Hypertension - BP = 181/89. Goal SBP < 140. 2. Hyperglycemia - Blood glucose = 368.  ?eICU Interventions ? Plan: ?Nicardipine IV infusion. Titrate to SBP < 140. ?Increase to Q4 hour sensitive Novolog SSI + 4 units Novolog tube feeding coverage.   ? ? ? ?Intervention Category ?Major Interventions: Hyperglycemia - active titration of insulin therapy;Hypertension - evaluation and management ? ?Hania Cerone Cornelia Copa ?10/19/2021, 1:52 AM ?

## 2021-10-20 ENCOUNTER — Ambulatory Visit: Payer: Self-pay | Admitting: Otolaryngology

## 2021-10-20 ENCOUNTER — Inpatient Hospital Stay (HOSPITAL_COMMUNITY): Payer: Medicaid Other

## 2021-10-20 DIAGNOSIS — J9601 Acute respiratory failure with hypoxia: Secondary | ICD-10-CM | POA: Diagnosis not present

## 2021-10-20 DIAGNOSIS — I6783 Posterior reversible encephalopathy syndrome: Secondary | ICD-10-CM | POA: Diagnosis not present

## 2021-10-20 DIAGNOSIS — R4182 Altered mental status, unspecified: Secondary | ICD-10-CM | POA: Diagnosis not present

## 2021-10-20 LAB — CBC
HCT: 22.9 % — ABNORMAL LOW (ref 36.0–46.0)
HCT: 23.9 % — ABNORMAL LOW (ref 36.0–46.0)
Hemoglobin: 7.5 g/dL — ABNORMAL LOW (ref 12.0–15.0)
Hemoglobin: 8.1 g/dL — ABNORMAL LOW (ref 12.0–15.0)
MCH: 32.8 pg (ref 26.0–34.0)
MCH: 33.9 pg (ref 26.0–34.0)
MCHC: 32.8 g/dL (ref 30.0–36.0)
MCHC: 33.9 g/dL (ref 30.0–36.0)
MCV: 100 fL (ref 80.0–100.0)
MCV: 100 fL (ref 80.0–100.0)
Platelets: 316 10*3/uL (ref 150–400)
Platelets: 335 10*3/uL (ref 150–400)
RBC: 2.29 MIL/uL — ABNORMAL LOW (ref 3.87–5.11)
RBC: 2.39 MIL/uL — ABNORMAL LOW (ref 3.87–5.11)
RDW: 16.6 % — ABNORMAL HIGH (ref 11.5–15.5)
RDW: 16.6 % — ABNORMAL HIGH (ref 11.5–15.5)
WBC: 11.4 10*3/uL — ABNORMAL HIGH (ref 4.0–10.5)
WBC: 11.2 10*3/uL — ABNORMAL HIGH (ref 4.0–10.5)
nRBC: 0 % (ref 0.0–0.2)
nRBC: 0 % (ref 0.0–0.2)

## 2021-10-20 LAB — BASIC METABOLIC PANEL
Anion gap: 19 — ABNORMAL HIGH (ref 5–15)
BUN: 100 mg/dL — ABNORMAL HIGH (ref 6–20)
CO2: 20 mmol/L — ABNORMAL LOW (ref 22–32)
Calcium: 8.2 mg/dL — ABNORMAL LOW (ref 8.9–10.3)
Chloride: 99 mmol/L (ref 98–111)
Creatinine, Ser: 5.83 mg/dL — ABNORMAL HIGH (ref 0.44–1.00)
GFR, Estimated: 8 mL/min — ABNORMAL LOW (ref >60)
Glucose, Bld: 161 mg/dL — ABNORMAL HIGH (ref 70–99)
Potassium: 5.3 mmol/L — ABNORMAL HIGH (ref 3.5–5.1)
Sodium: 138 mmol/L (ref 135–145)

## 2021-10-20 LAB — GLUCOSE, CAPILLARY
Glucose-Capillary: 122 mg/dL — ABNORMAL HIGH (ref 70–99)
Glucose-Capillary: 157 mg/dL — ABNORMAL HIGH (ref 70–99)
Glucose-Capillary: 157 mg/dL — ABNORMAL HIGH (ref 70–99)
Glucose-Capillary: 175 mg/dL — ABNORMAL HIGH (ref 70–99)
Glucose-Capillary: 180 mg/dL — ABNORMAL HIGH (ref 70–99)
Glucose-Capillary: 186 mg/dL — ABNORMAL HIGH (ref 70–99)

## 2021-10-20 LAB — HEPATITIS B SURFACE ANTIBODY,QUALITATIVE: Hep B S Ab: NONREACTIVE

## 2021-10-20 LAB — HEPATITIS B SURFACE ANTIGEN: Hepatitis B Surface Ag: NONREACTIVE

## 2021-10-20 IMAGING — DX DG CHEST 1V PORT
1 series · 1 of 1 positions shown · non-contrast
Comparison: Chest x-ray [DATE].

CLINICAL DATA: Hypoxia.

EXAM:
PORTABLE CHEST 1 VIEW

[chest ap]
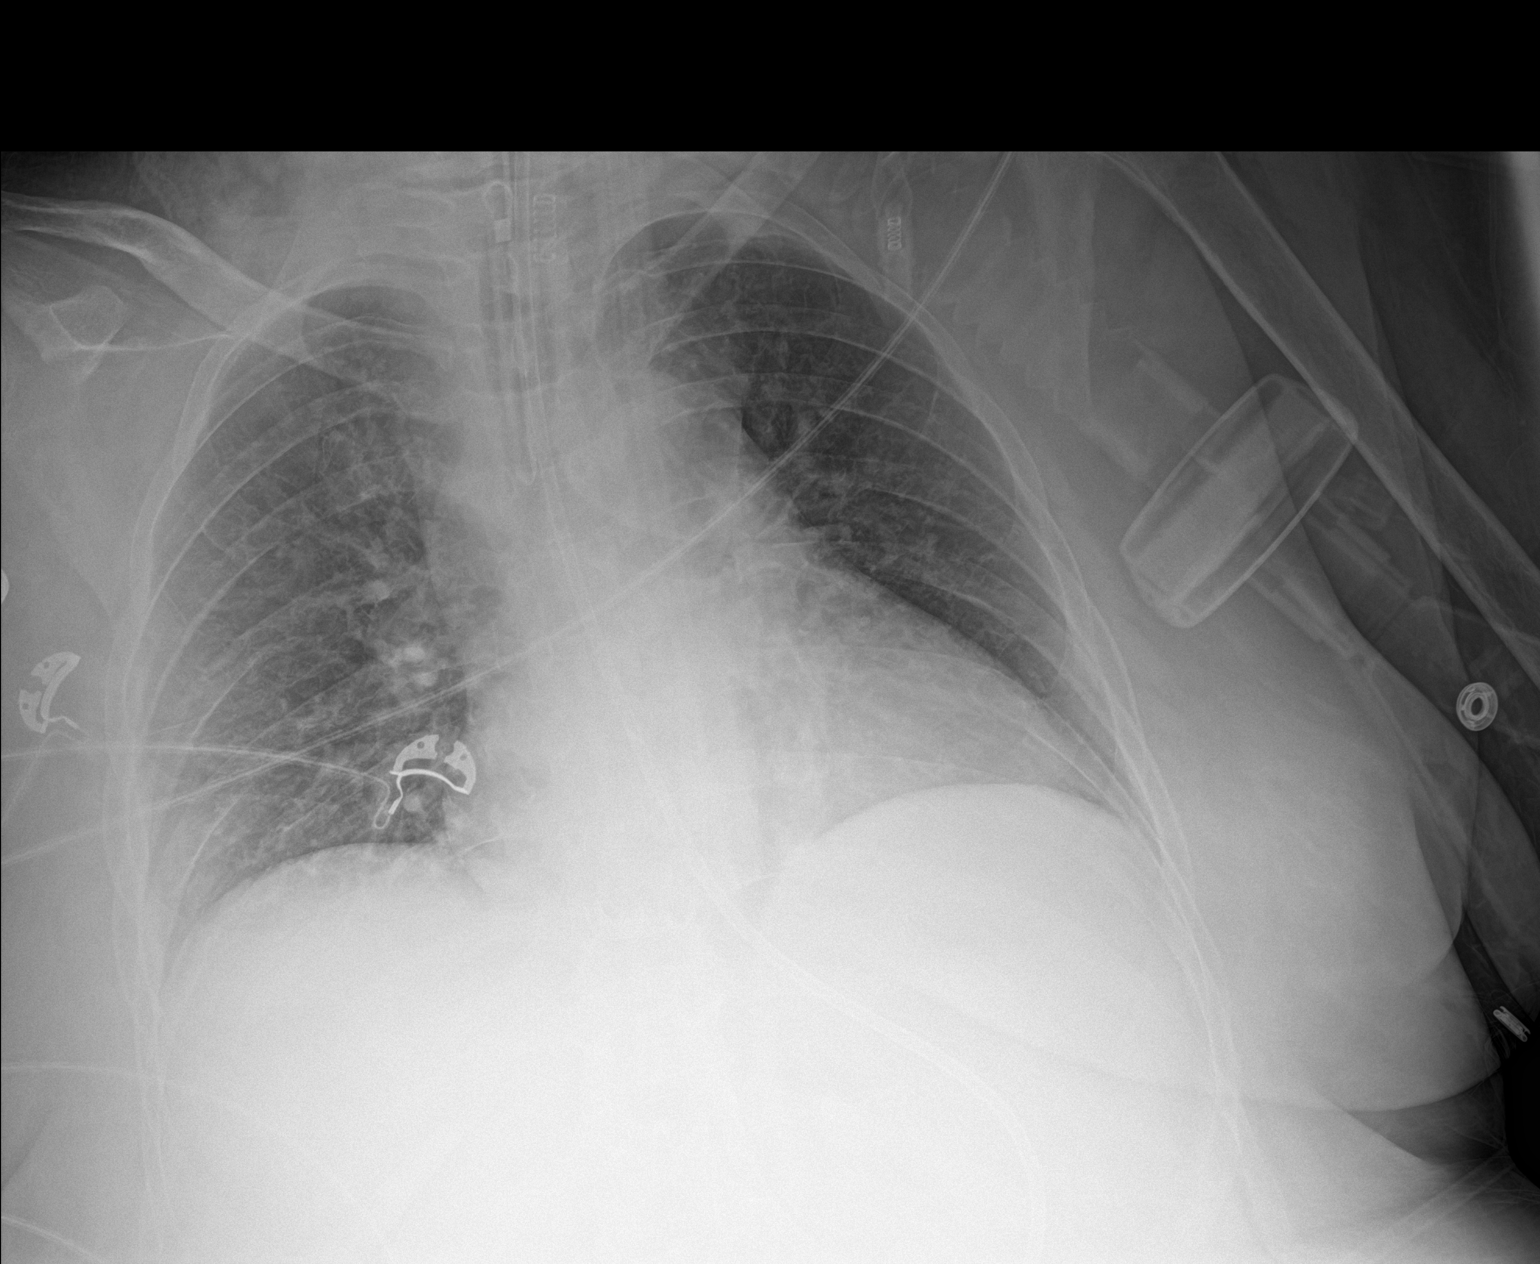

[1 of 1 positions shown; findings below may reference images not displayed]

FINDINGS: Endotracheal tube tip approximately 1.3 cm above the carina. Enteric
tube courses below the diaphragm outside the field of view. No
consolidation. No visible pleural effusions or pneumothorax. Similar
cardiomediastinal silhouette.
IMPRESSION: Endotracheal tube tip approximately 1.3 cm above the carina.

## 2021-10-20 MED ORDER — SILVER NITRATE-POT NITRATE 75-25 % EX MISC
4.0000 | Freq: Once | CUTANEOUS | Status: AC
  Administered 2021-10-20: 4 via TOPICAL
  Filled 2021-10-20: qty 4

## 2021-10-20 MED ORDER — "THROMBI-PAD 3""X3"" EX PADS"
4.0000 | MEDICATED_PAD | Freq: Once | CUTANEOUS | Status: AC
  Administered 2021-10-20: 4 via TOPICAL
  Filled 2021-10-20: qty 4

## 2021-10-20 MED ORDER — MIDAZOLAM HCL 2 MG/2ML IJ SOLN
INTRAMUSCULAR | Status: AC
  Administered 2021-10-20: 1 mg
  Filled 2021-10-20: qty 2

## 2021-10-20 MED ORDER — DOCUSATE SODIUM 50 MG/5ML PO LIQD
100.0000 mg | Freq: Two times a day (BID) | ORAL | Status: DC
  Administered 2021-10-20 – 2021-10-21 (×2): 100 mg
  Filled 2021-10-20 (×2): qty 10

## 2021-10-20 MED ORDER — FENTANYL CITRATE (PF) 100 MCG/2ML IJ SOLN
50.0000 ug | INTRAMUSCULAR | Status: DC | PRN
  Administered 2021-10-20 – 2021-10-22 (×12): 100 ug via INTRAVENOUS
  Filled 2021-10-20 (×12): qty 2

## 2021-10-20 MED ORDER — POLYETHYLENE GLYCOL 3350 17 G PO PACK
17.0000 g | PACK | Freq: Every day | ORAL | Status: DC
  Administered 2021-10-21: 17 g
  Filled 2021-10-20: qty 1

## 2021-10-20 MED ORDER — FENTANYL CITRATE (PF) 100 MCG/2ML IJ SOLN
50.0000 ug | INTRAMUSCULAR | Status: DC | PRN
  Administered 2021-10-24: 50 ug via INTRAVENOUS
  Filled 2021-10-20: qty 2

## 2021-10-20 MED ORDER — LACOSAMIDE 50 MG PO TABS
50.0000 mg | ORAL_TABLET | Freq: Two times a day (BID) | ORAL | Status: DC
  Administered 2021-10-20 – 2021-11-01 (×25): 50 mg
  Filled 2021-10-20 (×25): qty 1

## 2021-10-20 NOTE — Progress Notes (Signed)
Subjective: Patient continues to be encephalopathic, not following commands. ? ?ROS: Unable to obtain due to poor mental status ? ?Examination ? ?Vital signs in last 24 hours: ?Temp:  [97.1 ?F (36.2 ?C)-100.2 ?F (37.9 ?C)] 100.2 ?F (37.9 ?C) (04/14 1127) ?Pulse Rate:  [62-88] 74 (04/14 1108) ?Resp:  [0-36] 22 (04/14 1108) ?BP: (96-178)/(40-144) 167/56 (04/14 1108) ?SpO2:  [97 %-100 %] 99 % (04/14 1108) ?FiO2 (%):  [30 %] 30 % (04/14 1108) ? ?General: lying in bed, NAD ?Neuro: winces to noxious stimulation but doesn't open eyes or follow commands, pupils sluggishly reacting to light, no forced gaze deviation, roving eye movements , withdraws to noxious stimuli in all extremities  ? ?Basic Metabolic Panel: ?Recent Labs  ?Lab 10/14/21 ?0609 10/15/21 ?0605 10/16/21 ?0310 10/17/21 ?0145 10/18/21 ?1245 10/18/21 ?8099 10/19/21 ?8338 10/20/21 ?0727  ?NA 138 136 134* 133* 131* 135 135 138  ?K 5.1 3.5 4.8 5.2* 6.7* 4.0 4.0 5.3*  ?CL 99 97* 95* 95* 91* 93* 97* 99  ?CO2 '28 28 24 22 '$ 18* 24 21* 20*  ?GLUCOSE 104* 69* 225* 142* 232* 253* 457* 161*  ?BUN 50* 19 57* 87* 112* 61* 65* 100*  ?CREATININE 3.97* 1.69* 3.98* 5.46* 6.62* 3.93* 4.37* 5.83*  ?CALCIUM 8.6* 9.0 8.4* 8.2* 8.7* 9.0 9.0 8.2*  ?MG 1.8 1.9 1.9 2.0  --   --  2.2  --   ?PHOS 6.2* 2.4* 6.4*  --   --  4.9* 6.9*  --   ? ? ?CBC: ?Recent Labs  ?Lab 10/16/21 ?0310 10/17/21 ?0145 10/17/21 ?1444 10/18/21 ?0208 10/18/21 ?2152 10/19/21 ?2505 10/20/21 ?0727  ?WBC 12.4* 11.7*  --  10.8*  --  8.5 11.4*  ?NEUTROABS  --   --   --  9.5*  --   --   --   ?HGB 8.1* 6.9* 7.7* 8.8* 8.6* 8.9* 7.5*  ?HCT 24.8* 20.8* 23.4* 25.6* 25.7* 25.5* 22.9*  ?MCV 102.1* 101.5*  --  97.3  --  96.2 100.0  ?PLT 198 213  --  260  --  273 316  ? ? ? ?Coagulation Studies: ?Recent Labs  ?  10/18/21 ?2152  ?LABPROT 16.0*  ?INR 1.3*  ? ? ?Imaging ?No new brain imaging ? ?ASSESSMENT AND PLAN: 51 year old female with seizures and altered mental status in the setting of systolic blood pressure 397 which has since  improved. ?  ?Seizure ?Posterior reversible encephalopathy syndrome (PRES), resolved ?Persistent encephalopathy ?-No clear cause of encephalopathy.  No evidence of infection, no toxic -metabolic causes, seizures have resolved, has been off sedation for more than 10 days, MRI brain has been improving. ? ?Recommendations ?-Reduce vimpat to 50 mg BID ?-Continue provigil '100mg'$  daily to promote wakefulness ?-Continue seizure precautions ?-As needed IV Ativan 2 mg for clinical seizure-like activity ?-Management of rest of comorbidities per primary team ?- Plan discussed with critical care team ?  ?I have spent a total of 30mnutes with the patient reviewing hospital notes,  test results, labs and examining the patient as well as establishing an assessment and plan. > 50% of time was spent in direct patient care. ? ?PZeb Comfort?Epilepsy ?[facility] ?For questions after 5pm please refer to AMION to reach the Neurologist on call ? ?

## 2021-10-20 NOTE — Progress Notes (Signed)
?[facility] ?Progress Note  ? ?Assessment/ Plan:   ?OP HD: TTS [facility] ?  3h 71mn  75.5kg  2/2.5 bath  400 bfr   Hep none LUE AVF ? - 3/30 here > hep B Ag negative and hep B Ab's low/ not protective ? - mircera 200 q2 wks ? - venofer 50 weekly ? - rocaltrol 0.25 tiw ?  ?CXR 3/30 - no active disease ?  ?Assessment/ Plan:  ?AMS - found unresponsive at home. Had PRES and seizures. MS remains poor Neurology following.  ?VDRF- for airway protection.  She hasn't been able to be extubated. Going for trach per notes ?ESRD - on TTS [facility]. Off schedule HD today, 2nd HD this week. Next HD Sat. Placement for a patient on dialysis with a trach is exceedingly difficult as everyone is aware ?Volume - last wt was close to dry wt, no wts for 2 days. Should be under dry wt (75kg) soon.  ?DM2 - per pmd ?Possible sepsis - sp short course IV abx. BCx x2 NGTD ?Anemia - Hb 8-9s. Was on esa/ IV fe at OP unit. Cont darbe 150 ug q Friday and weekly IV Fe here.  ?CKD-BMM: CCa stable. Cont vdra tiw.  ?Nutrition: getting TF"s at 50 cc/hr ? ?RKelly Splinter MD ?10/20/2021, 4:53 PM ? ? ? ? ? ?Subjective:   ? ?Had HD again this am, w/ 4 L off.   ? ?Objective:   ?BP (!) 177/61 (BP Location: Right Leg)   Pulse 75   Temp 100.3 ?F (37.9 ?C) (Axillary)   Resp (!) 30   Ht '5\' 3"'  (1.6 m)   Wt 76.5 kg   LMP 12/17/2016   SpO2 96%   BMI 29.88 kg/m?  ? ?Physical Exam: ?GVZD:GLOVFIEPP in restraints, agitated ?CVS: RRR ?Resp: clear anteriorly ?Abd: soft ?Ext: no LE edema ?ACCESS: + T/B AVF-   ?Labs: ?BMET ?Recent Labs  ?Lab 10/14/21 ?0609 10/15/21 ?0605 10/16/21 ?0310 10/17/21 ?0145 10/18/21 ?0295104/12/23 ?0884104/13/23 ?0660604/14/23 ?0727  ?NA 138 136 134* 133* 131* 135 135 138  ?K 5.1 3.5 4.8 5.2* 6.7* 4.0 4.0 5.3*  ?CL 99 97* 95* 95* 91* 93* 97* 99  ?CO2 '28 28 24 22 ' 18* 24 21* 20*  ?GLUCOSE 104* 69* 225* 142* 232* 253* 457* 161*  ?BUN 50* 19 57* 87* 112* 61* 65* 100*  ?CREATININE 3.97* 1.69* 3.98* 5.46* 6.62*  3.93* 4.37* 5.83*  ?CALCIUM 8.6* 9.0 8.4* 8.2* 8.7* 9.0 9.0 8.2*  ?PHOS 6.2* 2.4* 6.4*  --   --  4.9* 6.9*  --   ? ? ?CBC ?Recent Labs  ?Lab 10/17/21 ?0145 10/17/21 ?1444 10/18/21 ?0208 10/18/21 ?2152 10/19/21 ?0301604/14/23 ?0727  ?WBC 11.7*  --  10.8*  --  8.5 11.4*  ?NEUTROABS  --   --  9.5*  --   --   --   ?HGB 6.9*   < > 8.8* 8.6* 8.9* 7.5*  ?HCT 20.8*   < > 25.6* 25.7* 25.5* 22.9*  ?MCV 101.5*  --  97.3  --  96.2 100.0  ?PLT 213  --  260  --  273 316  ? < > = values in this interval not displayed.  ? ? ? ?  ?Medications:   ? ? aspirin  81 mg Per Tube Daily  ? atorvastatin  80 mg Per Tube q1800  ? calcitRIOL  0.25 mcg Per Tube Q T,Th,Sat-1800  ? carvedilol  25 mg Per Tube BID WC  ? chlorhexidine  gluconate (MEDLINE KIT)  15 mL Mouth Rinse BID  ? Chlorhexidine Gluconate Cloth  6 each Topical Q0600  ? cholestyramine  4 g Per Tube TID  ? darbepoetin (ARANESP) injection - DIALYSIS  150 mcg Intravenous Q Thu-HD  ? docusate  100 mg Per Tube BID  ? enoxaparin (LOVENOX) injection  30 mg Subcutaneous Q24H  ? ezetimibe  10 mg Per Tube Daily  ? feeding supplement (PROSource TF)  45 mL Per Tube QID  ? fiber  1 packet Per Tube TID  ? folic acid  1 mg Per Tube Daily  ? hydrALAZINE  100 mg Per Tube BID  ? insulin aspart  3-9 Units Subcutaneous Q4H  ? insulin aspart  7 Units Subcutaneous Q4H  ? insulin detemir  20 Units Subcutaneous Q12H  ? lacosamide  50 mg Per Tube BID  ? leptospermum manuka honey  1 application. Topical UD  ? levothyroxine  50 mcg Per Tube QAC breakfast  ? mouth rinse  15 mL Mouth Rinse 10 times per day  ? modafinil  100 mg Per Tube Daily  ? multivitamin  1 tablet Per Tube QHS  ? pantoprazole sodium  40 mg Per Tube Daily  ? polyethylene glycol  17 g Per Tube Daily  ? thiamine  100 mg Per Tube Daily  ? vitamin B-12  1,000 mcg Per Tube Daily  ? ? ?Sandy Salaam Jaxton Casale ? ?10/20/2021, 4:50 PM   ?

## 2021-10-20 NOTE — Progress Notes (Signed)
? ?NAME:  COTY STUDENT, MRN:  [id number], DOB:  [birth year], LOS: 62 ?ADMISSION DATE:  09/21/2021, CONSULTATION DATE:  09/30/2021 ?REFERRING MD:  Dr. Roderic Palau, ER, CHIEF COMPLAINT:  AMS  ? ?History of Present Illness:  ?51 yo female former smoker with hx of ESRD on HD was found at her home by patient transport driver unresponsive.  Pt's neighbor noted that she was tapping her hands for about a minute, and then stopped.  She has snoring and drooling.  No improvement after narcan.  CBG normal.  Noted to have BP 215/96.  Intubated in [facility] for airway protection, given keppra load, started on diprivan, and started on antibiotics.  CT head negative for acute findings.  Treated for hyperkalemia.  Pt's daughter report she had similar episode on 09/24/21 and EMS was called to the house, but episode resolve and patient declined hospital evaluation.  No prior history of seizures or known head injury.  ER team d/w case with neurology and advised to transfer to [facility] for EEG monitoring and further neurology assessment. ? ?Pertinent  Medical History  ?CHF, Anemia, OA, CAD, DM type 2, ESRD on HD, HLD, HTN, CVA ? ?Significant Hospital Events: ?Including procedures, antibiotic start and stop dates in addition to other pertinent events   ?3/29 present to [facility], intubated, transfer to [facility] ?3/30 MRI Brain consistent with PRES ?4/3 off sedation, not waking up ?4/4 transitioned off insulin gtt to SSI ?4/5 Lares conversation; see IPAL note ?4/8 MRI with no acute findings ?4/11 trial of extubation failed, she was reintubated, bedside tracheostomy was attempted but failed ?4/12 CT neck was done, negative for acute findings ? ?Interim History / Subjective:  ?No acute events overnight, blood pressure well controlled. Remains on Precedex drip, 0.4 mcg/kg/hr.   ? ?Objective   ?Blood pressure 115/62, pulse 81, temperature 98 ?F (36.7 ?C), temperature source Axillary, resp. rate (!) 25, height '5\' 3"'$  (1.6 m), weight 76.5 kg, last menstrual period 12/17/2016,  SpO2 100 %. ?   ?Vent Mode: PRVC ?FiO2 (%):  [30 %-40 %] 30 % ?Set Rate:  [30 bmp] 30 bmp ?Vt Set:  [410 mL] 410 mL ?PEEP:  [5 cmH20] 5 cmH20 ?Pressure Support:  [8 cmH20] 8 cmH20 ?Plateau Pressure:  [17 cmH20-19 cmH20] 17 cmH20  ? ?Intake/Output Summary (Last 24 hours) at 10/20/2021 0721 ?Last data filed at 10/20/2021 0200 ?Gross per 24 hour  ?Intake 1513.38 ml  ?Output --  ?Net 1513.38 ml  ? ? ?Filed Weights  ? 10/16/21 0339 10/18/21 0645 10/18/21 1046  ?Weight: 79.9 kg 79.9 kg 76.5 kg  ? ?Physical Exam: ?General: Adult female intubated on precedex with RASS - 2 ?HEENT: [place]/AT, eyes anicteric.  ETT and DHT in place. Midline incision with small amount of bleeding and congealed clot ?Neuro: Eyes closed, does not open, withdrawing in all 4 extremities with painful stimuli, positive cough and gag.   ?Chest: Coarse breath sounds, no wheezes or rhonchi ?Heart: Regular rate and rhythm, no murmurs or gallops ?Abdomen: Soft, nontender, nondistended, bowel sounds present ?Skin: No rash ? ?Resolved Hospital Problem list   ?Hyperkalemia, resolved ?Hyponatremia, resolved ? ?Assessment & Plan:  ? ?PRES syndrome with associated status epilepticus ?Acute Metabolic encephalopathy secondary to PRES ?Initial MRI was consistent with PRES and repeat MRI after few days showed resolution ?Ongoing encephalopathy without clear etiology.  ?Continue Vimpat, seizure precautions ?Continue Provigil. Wean Precedex as tolerated.  ? ?Acute respiratory failure secondary to probable aspiration pneumonia ?Failed extubation due to neurological exam and aspiration.  Unable  to complete Perc tracheostomy due to anatomy.  ENT consulted and planning to proceed with tracheostomy tomorrow at 0730. ? ?End-stage renal disease on hemodialysis ?Hyperphosphatemia ?Nephrology following ?Continue intermittent hemodialysis.   ? ?Chronic HFrEF  ?Coronary artery disease/hyperlipidemia ?Continue carvedilol, hydralazine, aspirin, statin, Zetia ? ?Type 2 diabetes with  hyperglycemia ?On SSI q 4 hours with Levemir at 20 units q 12 hours and scheduled novolog 7 units q 4 hours.  ? ?Hypothyroidism ?TSH 2.7 this admission. Continue home Synthroid ? ?Unstageable sacral pressure ulcer, POA ?Continue wound care ? ?Best Practice (right click and "Reselect all SmartList Selections" daily)  ? ?Diet/type: tubefeeds ?DVT prophylaxis: prophylactic heparin  ?GI prophylaxis: PPI ?Lines: N/A ?Foley:  N/A ?Code Status:  full code ? ?Last date of multidisciplinary goals of care discussion [4/11 : Please see plan of care note  ? ?Total critical care time: 35 minutes ? ?Performed by: Paulita Fujita ?  ?Critical care time was exclusive of separately billable procedures and treating other patients. ?  ?Critical care was necessary to treat or prevent imminent or life-threatening deterioration. ?  ?Critical care was time spent personally by me on the following activities: development of treatment plan with patient and/or surrogate as well as nursing, discussions with consultants, evaluation of patient's response to treatment, examination of patient, obtaining history from patient or surrogate, ordering and performing treatments and interventions, ordering and review of laboratory studies, ordering and review of radiographic studies, pulse oximetry and re-evaluation of patient's condition. ?  ?Paulita Fujita ?[facility] ?Please use secure chat ?If no response, please call [phone] until 7pm ?After 7pm, Please call E-link [phone] ? ?

## 2021-10-20 NOTE — Anesthesia Preprocedure Evaluation (Addendum)
Anesthesia Evaluation  ?Patient identified by MRN, date of birth, ID band ?Patient unresponsive ? ? ? ?Reviewed: ?Allergy & Precautions, NPO status , Patient's Chart, lab work & pertinent test results, Unable to perform ROS - Chart review only ? ?History of Anesthesia Complications ?Negative for: history of anesthetic complications ? ?Airway ?Mallampati: Intubated ? ? ? ? ? ? Dental ?  ?Pulmonary ?former smoker,  ? ?Respiratory failure, intubated ? ?  ? ?+ decreased breath sounds ? ?+ intubated ? ? ? Cardiovascular ?hypertension, Pt. on medications ?+ CAD, + Past MI, + Cardiac Stents, + CABG and +CHF  ? ?Rhythm:Regular Rate:Normal ? ? ?  ?Neuro/Psych ? Headaches, Seizures -,  PSYCHIATRIC DISORDERS Depression  ?PRES ?Status epilepticus ? ?CVA   ? GI/Hepatic ?GERD  Medicated,  ?Endo/Other  ?diabetes, Insulin DependentHypothyroidism  ? Renal/GU ?ESRF and DialysisRenal disease  ? ?  ?Musculoskeletal ? ?(+) Arthritis ,  ? Abdominal ?  ?Peds ? Hematology ? ?(+) Blood dyscrasia, anemia ,   ?Anesthesia Other Findings ? ? Reproductive/Obstetrics ? ?s/p tubal ligation ? ? ?  ? ? ? ? ? ? ? ? ? ? ? ? ? ?  ?  ? ? ? ? ? ? ? ?Anesthesia Physical ?Anesthesia Plan ? ?ASA: 4 ? ?Anesthesia Plan: General  ? ?Post-op Pain Management:   ? ?Induction: Inhalational ? ?PONV Risk Score and Plan: 3 and Treatment may vary due to age or medical condition ? ?Airway Management Planned: Tracheostomy and Oral ETT ? ?Additional Equipment: None ? ?Intra-op Plan:  ? ?Post-operative Plan: Post-operative intubation/ventilation ? ?Informed Consent:  ? ? ? ?History available from chart only ? ?Plan Discussed with: CRNA and Anesthesiologist ? ?Anesthesia Plan Comments:   ? ? ? ? ? ?Anesthesia Quick Evaluation ? ?

## 2021-10-21 ENCOUNTER — Encounter (HOSPITAL_COMMUNITY): Admission: EM | Disposition: E | Payer: Self-pay | Source: Home / Self Care | Attending: Internal Medicine

## 2021-10-21 ENCOUNTER — Inpatient Hospital Stay (HOSPITAL_COMMUNITY): Payer: Medicaid Other | Admitting: Anesthesiology

## 2021-10-21 ENCOUNTER — Inpatient Hospital Stay (HOSPITAL_COMMUNITY): Payer: Medicaid Other

## 2021-10-21 DIAGNOSIS — J9601 Acute respiratory failure with hypoxia: Secondary | ICD-10-CM | POA: Diagnosis not present

## 2021-10-21 DIAGNOSIS — Z9911 Dependence on respirator [ventilator] status: Secondary | ICD-10-CM

## 2021-10-21 DIAGNOSIS — I6783 Posterior reversible encephalopathy syndrome: Secondary | ICD-10-CM | POA: Diagnosis not present

## 2021-10-21 DIAGNOSIS — R4182 Altered mental status, unspecified: Secondary | ICD-10-CM | POA: Diagnosis not present

## 2021-10-21 DIAGNOSIS — Z992 Dependence on renal dialysis: Secondary | ICD-10-CM

## 2021-10-21 DIAGNOSIS — J9690 Respiratory failure, unspecified, unspecified whether with hypoxia or hypercapnia: Secondary | ICD-10-CM

## 2021-10-21 DIAGNOSIS — I132 Hypertensive heart and chronic kidney disease with heart failure and with stage 5 chronic kidney disease, or end stage renal disease: Secondary | ICD-10-CM

## 2021-10-21 DIAGNOSIS — I2510 Atherosclerotic heart disease of native coronary artery without angina pectoris: Secondary | ICD-10-CM

## 2021-10-21 DIAGNOSIS — Z87891 Personal history of nicotine dependence: Secondary | ICD-10-CM

## 2021-10-21 DIAGNOSIS — I159 Secondary hypertension, unspecified: Secondary | ICD-10-CM | POA: Diagnosis not present

## 2021-10-21 DIAGNOSIS — N186 End stage renal disease: Secondary | ICD-10-CM

## 2021-10-21 HISTORY — PX: TRACHEOSTOMY TUBE PLACEMENT: SHX814

## 2021-10-21 LAB — BASIC METABOLIC PANEL
Anion gap: 13 (ref 5–15)
Anion gap: 15 (ref 5–15)
BUN: 48 mg/dL — ABNORMAL HIGH (ref 6–20)
BUN: 73 mg/dL — ABNORMAL HIGH (ref 6–20)
CO2: 24 mmol/L (ref 22–32)
CO2: 20 mmol/L — ABNORMAL LOW (ref 22–32)
Calcium: 8.2 mg/dL — ABNORMAL LOW (ref 8.9–10.3)
Calcium: 8.1 mg/dL — ABNORMAL LOW (ref 8.9–10.3)
Chloride: 99 mmol/L (ref 98–111)
Chloride: 98 mmol/L (ref 98–111)
Creatinine, Ser: 3.43 mg/dL — ABNORMAL HIGH (ref 0.44–1.00)
Creatinine, Ser: 4.55 mg/dL — ABNORMAL HIGH (ref 0.44–1.00)
GFR, Estimated: 16 mL/min — ABNORMAL LOW (ref >60)
GFR, Estimated: 11 mL/min — ABNORMAL LOW (ref >60)
Glucose, Bld: 204 mg/dL — ABNORMAL HIGH (ref 70–99)
Glucose, Bld: 320 mg/dL — ABNORMAL HIGH (ref 70–99)
Potassium: 4 mmol/L (ref 3.5–5.1)
Potassium: 4.9 mmol/L (ref 3.5–5.1)
Sodium: 136 mmol/L (ref 135–145)
Sodium: 133 mmol/L — ABNORMAL LOW (ref 135–145)

## 2021-10-21 LAB — CBC
HCT: 24.4 % — ABNORMAL LOW (ref 36.0–46.0)
Hemoglobin: 7.8 g/dL — ABNORMAL LOW (ref 12.0–15.0)
MCH: 32.1 pg (ref 26.0–34.0)
MCHC: 32 g/dL (ref 30.0–36.0)
MCV: 100.4 fL — ABNORMAL HIGH (ref 80.0–100.0)
Platelets: 323 10*3/uL (ref 150–400)
RBC: 2.43 MIL/uL — ABNORMAL LOW (ref 3.87–5.11)
RDW: 16.6 % — ABNORMAL HIGH (ref 11.5–15.5)
WBC: 9.5 10*3/uL (ref 4.0–10.5)
nRBC: 0 % (ref 0.0–0.2)

## 2021-10-21 LAB — GLUCOSE, CAPILLARY
Glucose-Capillary: 163 mg/dL — ABNORMAL HIGH (ref 70–99)
Glucose-Capillary: 202 mg/dL — ABNORMAL HIGH (ref 70–99)
Glucose-Capillary: 203 mg/dL — ABNORMAL HIGH (ref 70–99)
Glucose-Capillary: 207 mg/dL — ABNORMAL HIGH (ref 70–99)
Glucose-Capillary: 238 mg/dL — ABNORMAL HIGH (ref 70–99)
Glucose-Capillary: 244 mg/dL — ABNORMAL HIGH (ref 70–99)
Glucose-Capillary: 316 mg/dL — ABNORMAL HIGH (ref 70–99)
Glucose-Capillary: 324 mg/dL — ABNORMAL HIGH (ref 70–99)
Glucose-Capillary: 82 mg/dL (ref 70–99)
Glucose-Capillary: 85 mg/dL (ref 70–99)

## 2021-10-21 IMAGING — DX DG ABDOMEN 1V
1 series · 2 of 2 positions shown · non-contrast
Comparison: [DATE].

CLINICAL DATA: Confirm NG tube placement.

EXAM:
ABDOMEN - 1 VIEW

[Series 1: abdomen · 0.14mm/px · 2 of 2 slices shown]
[im 1/2]
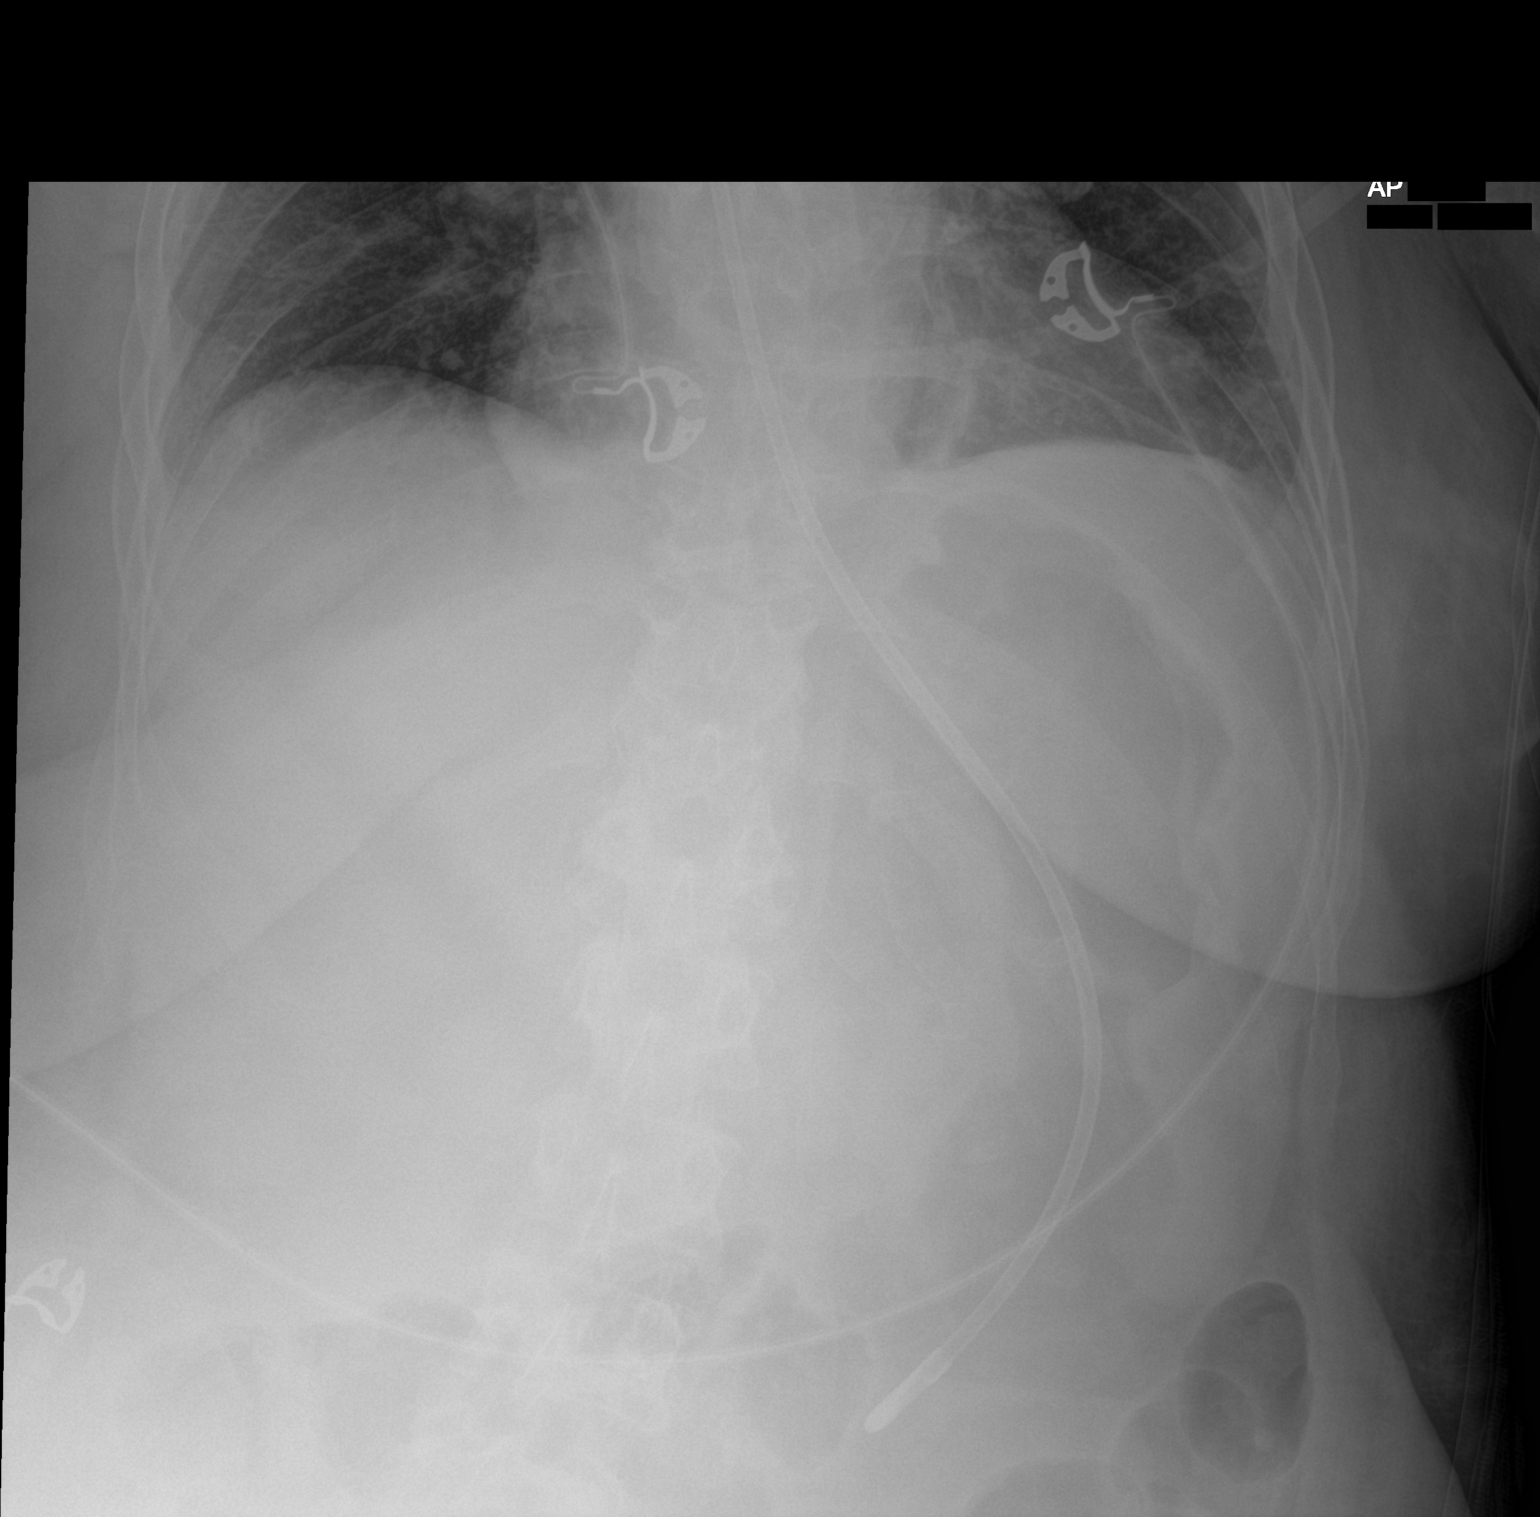
[im 2/2]
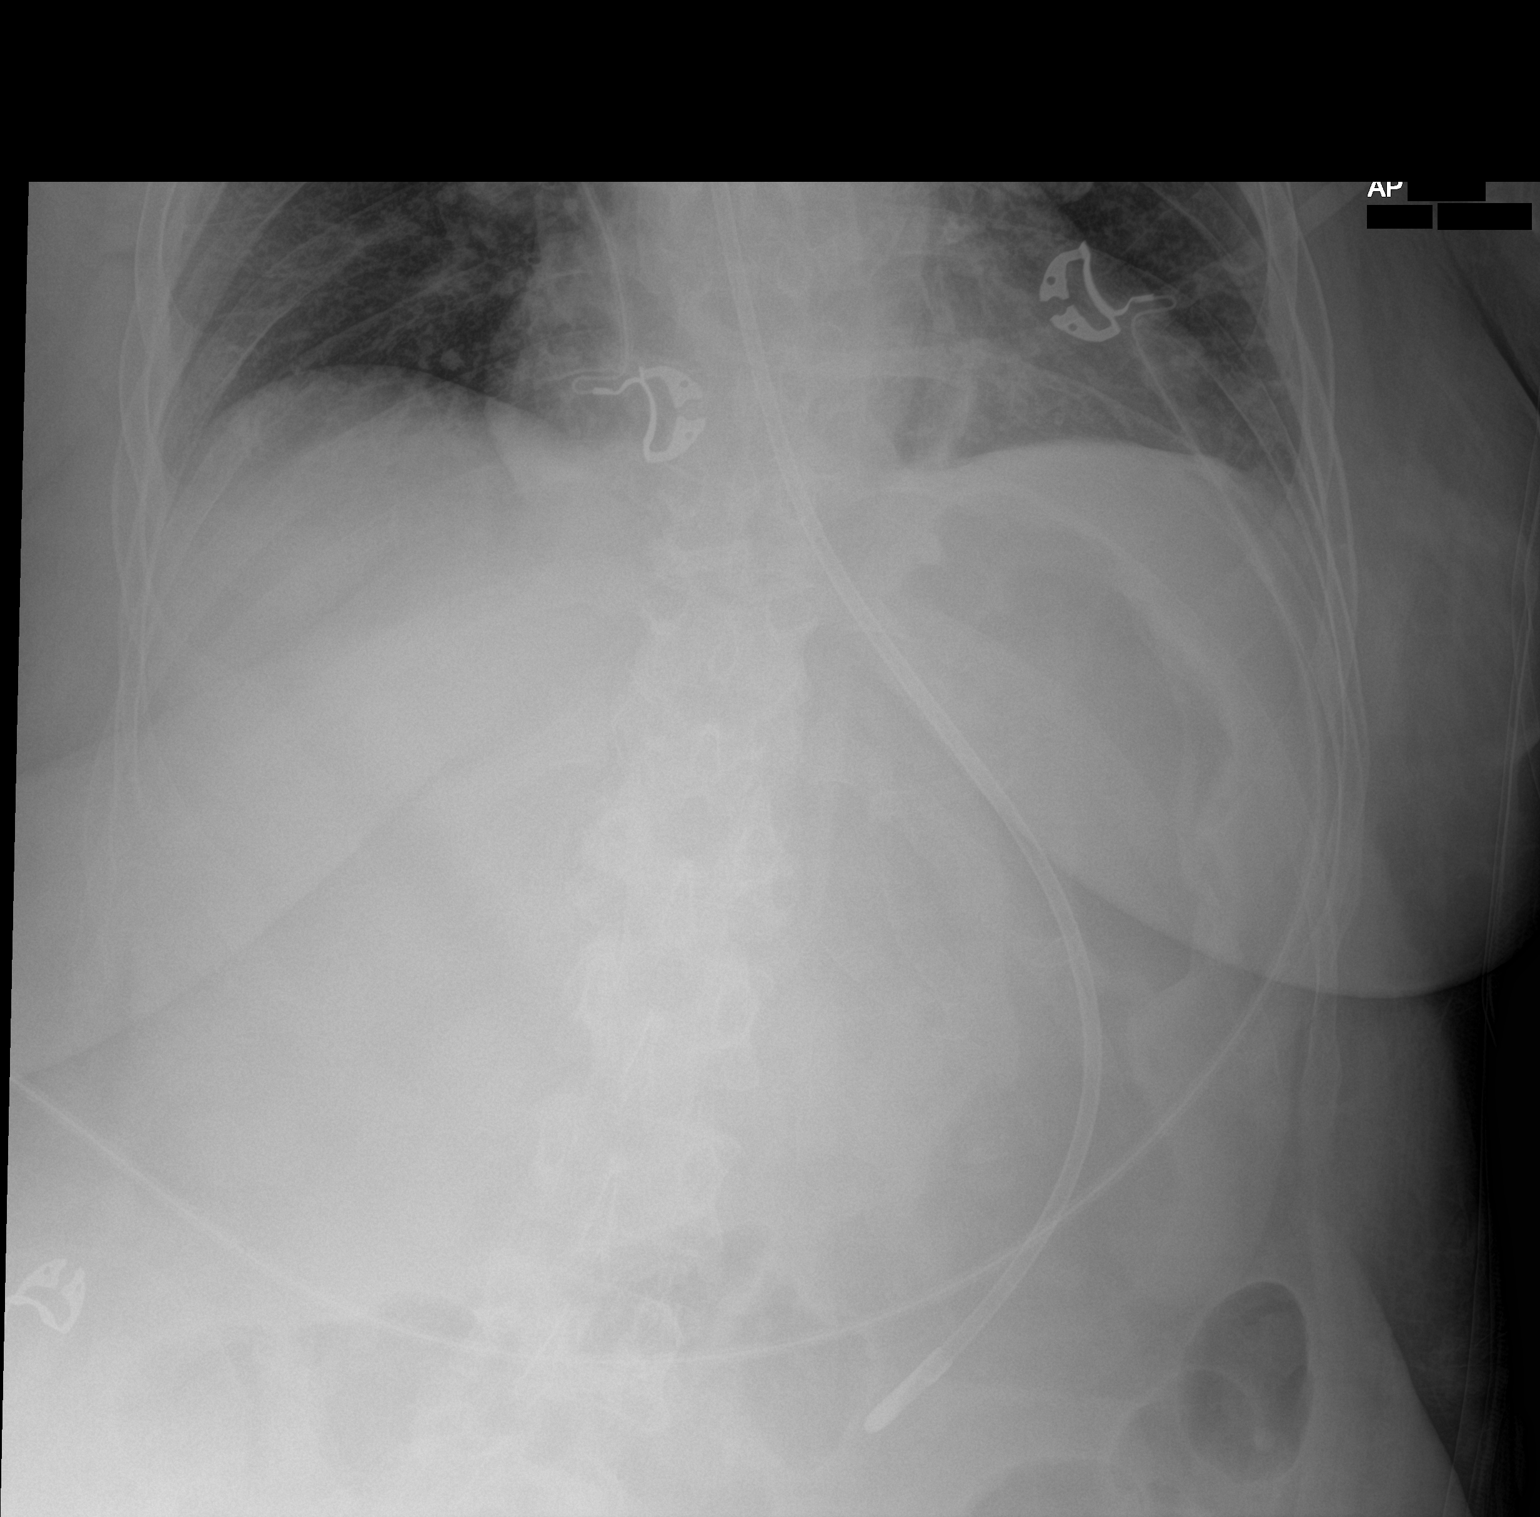

[2 of 2 positions shown; findings below may reference images not displayed]

FINDINGS: The bowel gas pattern is normal. The distal tip of the enteric tube
terminates in the region of the distal stomach. No radio-opaque
calculi. The cardiac silhouette is enlarged.
IMPRESSION: The enteric tube terminates in the region of the distal stomach.

## 2021-10-21 SURGERY — CREATION, TRACHEOSTOMY
Anesthesia: General

## 2021-10-21 MED ORDER — DEXTROSE 50 % IV SOLN: 0.0000 mL | INTRAVENOUS | Status: DC | PRN

## 2021-10-21 MED ORDER — MIDAZOLAM HCL 2 MG/2ML IJ SOLN
INTRAMUSCULAR | Status: AC
  Filled 2021-10-21: qty 2

## 2021-10-21 MED ORDER — FENTANYL CITRATE (PF) 250 MCG/5ML IJ SOLN
INTRAMUSCULAR | Status: DC | PRN
  Administered 2021-10-21: 50 ug via INTRAVENOUS

## 2021-10-21 MED ORDER — FENTANYL CITRATE (PF) 250 MCG/5ML IJ SOLN
INTRAMUSCULAR | Status: AC
  Filled 2021-10-21: qty 5

## 2021-10-21 MED ORDER — LIDOCAINE-EPINEPHRINE 1 %-1:100000 IJ SOLN
INTRAMUSCULAR | Status: AC
  Filled 2021-10-21: qty 1

## 2021-10-21 MED ORDER — PHENYLEPHRINE 40 MCG/ML (10ML) SYRINGE FOR IV PUSH (FOR BLOOD PRESSURE SUPPORT)
PREFILLED_SYRINGE | INTRAVENOUS | Status: DC | PRN
  Administered 2021-10-21: 40 ug via INTRAVENOUS

## 2021-10-21 MED ORDER — ROCURONIUM BROMIDE 10 MG/ML (PF) SYRINGE
PREFILLED_SYRINGE | INTRAVENOUS | Status: DC | PRN
  Administered 2021-10-21: 50 mg via INTRAVENOUS

## 2021-10-21 MED ORDER — PROPOFOL 10 MG/ML IV BOLUS
INTRAVENOUS | Status: AC
  Filled 2021-10-21: qty 20

## 2021-10-21 MED ORDER — INSULIN REGULAR(HUMAN) IN NACL 100-0.9 UT/100ML-% IV SOLN
INTRAVENOUS | Status: DC
  Administered 2021-10-21: 9.5 [IU]/h via INTRAVENOUS
  Filled 2021-10-21: qty 100

## 2021-10-21 MED ORDER — SODIUM CHLORIDE 0.9 % IV SOLN: INTRAVENOUS | Status: DC | PRN

## 2021-10-21 MED ORDER — 0.9 % SODIUM CHLORIDE (POUR BTL) OPTIME
TOPICAL | Status: DC | PRN
  Administered 2021-10-21: 1000 mL

## 2021-10-21 MED ORDER — MIDAZOLAM HCL 2 MG/2ML IJ SOLN
INTRAMUSCULAR | Status: DC | PRN
  Administered 2021-10-21: 2 mg via INTRAVENOUS

## 2021-10-21 SURGICAL SUPPLY — 38 items
APL SKNCLS STERI-STRIP NONHPOA (GAUZE/BANDAGES/DRESSINGS)
BAG COUNTER SPONGE SURGICOUNT (BAG) ×2 IMPLANT
BAG SPNG CNTER NS LX DISP (BAG) ×1
BENZOIN TINCTURE PRP APPL 2/3 (GAUZE/BANDAGES/DRESSINGS) IMPLANT
BLADE CLIPPER SURG (BLADE) IMPLANT
CANISTER SUCT 3000ML PPV (MISCELLANEOUS) ×2 IMPLANT
CLEANER TIP ELECTROSURG 2X2 (MISCELLANEOUS) ×2 IMPLANT
COVER SURGICAL LIGHT HANDLE (MISCELLANEOUS) ×2 IMPLANT
DECANTER SPIKE VIAL GLASS SM (MISCELLANEOUS) ×2 IMPLANT
DRAPE HALF SHEET 40X57 (DRAPES) IMPLANT
ELECT COATED BLADE 2.86 ST (ELECTRODE) ×2 IMPLANT
ELECT REM PT RETURN 9FT ADLT (ELECTROSURGICAL) ×2
ELECTRODE REM PT RTRN 9FT ADLT (ELECTROSURGICAL) ×1 IMPLANT
GAUZE 4X4 16PLY ~~LOC~~+RFID DBL (SPONGE) ×2 IMPLANT
GLOVE ECLIPSE 7.5 STRL STRAW (GLOVE) ×2 IMPLANT
GOWN STRL REUS W/ TWL LRG LVL3 (GOWN DISPOSABLE) ×2 IMPLANT
GOWN STRL REUS W/TWL LRG LVL3 (GOWN DISPOSABLE) ×4
KIT BASIN OR (CUSTOM PROCEDURE TRAY) ×2 IMPLANT
KIT TURNOVER KIT B (KITS) ×2 IMPLANT
NDL PRECISIONGLIDE 27X1.5 (NEEDLE) ×1 IMPLANT
NEEDLE PRECISIONGLIDE 27X1.5 (NEEDLE) ×2 IMPLANT
NS IRRIG 1000ML POUR BTL (IV SOLUTION) ×2 IMPLANT
PAD ARMBOARD 7.5X6 YLW CONV (MISCELLANEOUS) ×4 IMPLANT
PENCIL FOOT CONTROL (ELECTRODE) ×2 IMPLANT
SPONGE DRAIN TRACH 4X4 STRL 2S (GAUZE/BANDAGES/DRESSINGS) ×1 IMPLANT
SUT CHROMIC 2 0 SH (SUTURE) ×2 IMPLANT
SUT ETHILON 3 0 PS 1 (SUTURE) ×2 IMPLANT
SUT SILK 4 0 (SUTURE) ×2
SUT SILK 4 0 TIE 10X30 (SUTURE) ×2 IMPLANT
SUT SILK 4-0 18XBRD TIE 12 (SUTURE) ×1 IMPLANT
SYR 20ML LL LF (SYRINGE) ×2 IMPLANT
SYR CONTROL 10ML LL (SYRINGE) IMPLANT
TOWEL GREEN STERILE FF (TOWEL DISPOSABLE) ×2 IMPLANT
TRAY ENT MC OR (CUSTOM PROCEDURE TRAY) ×2 IMPLANT
TUBE CONNECTING 12X1/4 (SUCTIONS) ×2 IMPLANT
TUBE TRACH FLEX 8.0 CUFF (MISCELLANEOUS) ×2
TUBE TRACH FLEX 8.5 CUFF (MISCELLANEOUS) IMPLANT
WATER STERILE IRR 1000ML POUR (IV SOLUTION) ×2 IMPLANT

## 2021-10-21 NOTE — Progress Notes (Addendum)
Cortrak not at the same marking as previously charted. TF on hold. Elink made aware. KUB ordered. ? ?-KUB showed cortrak in good position. TB restarted. ?

## 2021-10-21 NOTE — Transfer of Care (Signed)
Immediate Anesthesia Transfer of Care Note ? ?Patient: Melanie Hall ? ?Procedure(s) Performed: TRACHEOSTOMY ? ?Patient Location: ICU ? ?Anesthesia Type:General ? ?Level of Consciousness: Patient remains intubated per anesthesia plan ? ?Airway & Oxygen Therapy: Patient connected to tracheostomy mask oxygen ? ?Post-op Assessment: Report given to RN and Post -op Vital signs reviewed and stable ? ?Post vital signs: Reviewed and stable ? ?Last Vitals:  ?Vitals Value Taken Time  ?BP 137/72 10/31/2021 0855  ?Temp    ?Pulse 63 10/10/2021 0856  ?Resp 14 10/16/2021 0856  ?SpO2 99 % 10/26/2021 0856  ?Vitals shown include unvalidated device data. ? ?Last Pain:  ?Vitals:  ? 10/19/2021 0811  ?TempSrc: Oral  ?PainSc:   ?   ? ?  ? ?Complications: No notable events documented. ?

## 2021-10-21 NOTE — Progress Notes (Signed)
?[facility] ?Progress Note  ? ?Assessment/ Plan:   ?OP HD: TTS [facility] [place] ?  3h 65mn  75.5kg  2/2.5 bath  400 bfr   Hep none LUE AVF ? - 4/14 > hep B Ag negative and hep B Ab's low/ not protective ? - mircera 200 q2 wks ? - venofer 50 weekly ? - rocaltrol 0.25 tiw ?  ?CXR 3/30 - no active disease ?  ?Assessment/ Plan:  ?AMS - found unresponsive at home. Had PRES and seizures, seen by neurology. MS remains poor. MRI has been improving. Per neuro/ pmd. Sz meds lowered and getting provigil to promote wakefulness.  ?VDRF- for airway protection.  She hasn't been able to be extubated. Trach placement today per ENT.  ?ESRD - on TTS [facility]. Next HD today on schedule. Placement for a patient on dialysis with a trach is exceedingly difficult as everyone is aware ?Volume - peak wt 84kg here, improving now w/ larger UF goals. Down to 76kg today which is her prior dry wt.  ?DM2 - per pmd ?Possible sepsis - sp short course IV abx. BCx x2 NGTD ?Anemia - Hb 8-9s. Was on esa/ IV fe at OP unit. Cont darbe 150 ug q Friday and weekly IV Fe here.  ?CKD-BMM: CCa stable. Cont vdra tiw.  ?Nutrition: getting TF"s at 50 cc/hr ? ?RKelly Splinter MD ?10/18/2021, 6:52 AM ? ? ? ? ? ?Subjective:   ? ?Had HD again this am, w/ 4 L off.   ? ?Objective:   ?BP 134/62   Pulse 67   Temp 97.9 ?F (36.6 ?C) (Axillary)   Resp (!) 29   Ht 5' 3" (1.6 m)   Wt 76.5 kg   LMP 12/17/2016   SpO2 100%   BMI 29.88 kg/m?  ? ?Physical Exam: ?GMMH:WKGSUPJSR in restraints, agitated ?CVS: RRR ?Resp: clear anteriorly ?Abd: soft ?Ext: no LE edema ?ACCESS: + T/B AVF-   ? ? ? ?Labs: ?BMET ?Recent Labs  ?Lab 10/15/21 ?0159404/10/23 ?0310 10/17/21 ?0145 10/18/21 ?0585904/12/23 ?0292404/13/23 ?0462804/14/23 ?0638104/06/2022 ?0108  ?NA 136 134* 133* 131* 135 135 138 136  ?K 3.5 4.8 5.2* 6.7* 4.0 4.0 5.3* 4.0  ?CL 97* 95* 95* 91* 93* 97* 99 99  ?CO2 _0 18* 24 21* 20* 24  ?GLUCOSE 69* 225* 142* 232* 253* 457* 161* 204*  ?BUN 19 57* 87*  112* 61* 65* 100* 48*  ?CREATININE 1.69* 3.98* 5.46* 6.62* 3.93* 4.37* 5.83* 3.43*  ?CALCIUM 9.0 8.4* 8.2* 8.7* 9.0 9.0 8.2* 8.2*  ?PHOS 2.4* 6.4*  --   --  4.9* 6.9*  --   --   ? ? ?CBC ?Recent Labs  ?Lab 10/18/21 ?0208 10/18/21 ?2152 10/19/21 ?0771104/14/23 ?0727 10/20/21 ?1828 10/12/2021 ?0108  ?WBC 10.8*  --  8.5 11.4* 11.2* 9.5  ?NEUTROABS 9.5*  --   --   --   --   --   ?HGB 8.8*   < > 8.9* 7.5* 8.1* 7.8*  ?HCT 25.6*   < > 25.5* 22.9* 23.9* 24.4*  ?MCV 97.3  --  96.2 100.0 100.0 100.4*  ?PLT 260  --  273 316 335 323  ? < > = values in this interval not displayed.  ? ? ? ?  ?Medications:   ? ? aspirin  81 mg Per Tube Daily  ? atorvastatin  80 mg Per Tube q1800  ? calcitRIOL  0.25 mcg Per Tube Q T,Th,Sat-1800  ? carvedilol  25 mg Per  Tube BID WC  ? chlorhexidine gluconate (MEDLINE KIT)  15 mL Mouth Rinse BID  ? Chlorhexidine Gluconate Cloth  6 each Topical Q0600  ? cholestyramine  4 g Per Tube TID  ? darbepoetin (ARANESP) injection - DIALYSIS  150 mcg Intravenous Q Thu-HD  ? docusate  100 mg Per Tube BID  ? enoxaparin (LOVENOX) injection  30 mg Subcutaneous Q24H  ? ezetimibe  10 mg Per Tube Daily  ? feeding supplement (PROSource TF)  45 mL Per Tube QID  ? fiber  1 packet Per Tube TID  ? folic acid  1 mg Per Tube Daily  ? hydrALAZINE  100 mg Per Tube BID  ? insulin aspart  3-9 Units Subcutaneous Q4H  ? insulin aspart  7 Units Subcutaneous Q4H  ? insulin detemir  20 Units Subcutaneous Q12H  ? lacosamide  50 mg Per Tube BID  ? leptospermum manuka honey  1 application. Topical UD  ? levothyroxine  50 mcg Per Tube QAC breakfast  ? mouth rinse  15 mL Mouth Rinse 10 times per day  ? modafinil  100 mg Per Tube Daily  ? multivitamin  1 tablet Per Tube QHS  ? pantoprazole sodium  40 mg Per Tube Daily  ? polyethylene glycol  17 g Per Tube Daily  ? thiamine  100 mg Per Tube Daily  ? vitamin B-12  1,000 mcg Per Tube Daily  ? ? ? ? ?

## 2021-10-21 NOTE — Progress Notes (Addendum)
eLink Physician-Brief Progress Note ?Patient Name: Melanie Hall ?DOB: 01-Dec-1970 ?MRN: [id number] ? ? ?Date of Service ? 10/18/2021  ?HPI/Events of Note ? CBG 316. Ssi protocol saying to go for insuline gtt, got ssi 16 units at 16:00 ?Last AG 13 from AM. ? ?On TF. S/p trach, new today in AM. ?  ?eICU Interventions ? Will go for Insuline protocol. Phase 2.   ? ? ? ?Intervention Category ?Intermediate Interventions: Hyperglycemia - evaluation and treatment ? ?Elmer Sow ?10/08/2021, 8:01 PM ? ?00:43 ?KUB reviewed. Ok to use NGT. In place.  ?

## 2021-10-21 NOTE — Progress Notes (Signed)
? ?NAME:  Melanie Hall, MRN:  [id number], DOB:  [birth year], LOS: 52 ?ADMISSION DATE:  09/27/2021, CONSULTATION DATE:  09/11/2021 ?REFERRING MD:  Dr. Roderic Palau, ER, CHIEF COMPLAINT:  AMS  ? ?History of Present Illness:  ?51 yo female former smoker with hx of ESRD on HD was found at her home by patient transport driver unresponsive.  Pt's neighbor noted that she was tapping her hands for about a minute, and then stopped.  She has snoring and drooling.  No improvement after narcan.  CBG normal.  Noted to have BP 215/96.  Intubated in [facility] for airway protection, given keppra load, started on diprivan, and started on antibiotics.  CT head negative for acute findings.  Treated for hyperkalemia.  Pt's daughter report she had similar episode on 09/24/21 and EMS was called to the house, but episode resolve and patient declined hospital evaluation.  No prior history of seizures or known head injury.  ER team d/w case with neurology and advised to transfer to [facility] for EEG monitoring and further neurology assessment. ? ?Pertinent  Medical History  ?CHF, Anemia, OA, CAD, DM type 2, ESRD on HD, HLD, HTN, CVA ? ?Significant Hospital Events: ?Including procedures, antibiotic start and stop dates in addition to other pertinent events   ?3/29 present to [facility], intubated, transfer to [facility] ?3/30 MRI Brain consistent with PRES ?4/3 off sedation, not waking up ?4/4 transitioned off insulin gtt to SSI ?4/5 [place] conversation; see IPAL note ?4/8 MRI with no acute findings ?4/11 trial of extubation failed, she was reintubated, bedside tracheostomy was attempted but failed ?4/12 CT neck was done, negative for acute findings ? ?Interim History / Subjective:  ?No overnight events ? ?Tracheostomy performed in OR this morning, tolerated well, remains hemodynamically stable ? ?Objective   ?Blood pressure 112/71, pulse 66, temperature 97.8 ?F (36.6 ?C), temperature source Oral, resp. rate (!) 0, height '5\' 3"'$  (1.6 m), weight 76.5 kg, last menstrual period  12/17/2016, SpO2 100 %. ?   ?Vent Mode: PRVC ?FiO2 (%):  [30 %] 30 % ?Set Rate:  [30 bmp] 30 bmp ?Vt Set:  [410 mL] 410 mL ?PEEP:  [5 cmH20] 5 cmH20 ?Pressure Support:  [8 cmH20] 8 cmH20 ?Plateau Pressure:  [10 DXI33-82 cmH20] 10 cmH20  ? ?Intake/Output Summary (Last 24 hours) at 11/01/2021 0941 ?Last data filed at 10/25/2021 0845 ?Gross per 24 hour  ?Intake 2001.79 ml  ?Output 4010 ml  ?Net -2008.21 ml  ? ?Filed Weights  ? 10/16/21 0339 10/18/21 0645 10/18/21 1046  ?Weight: 79.9 kg 79.9 kg 76.5 kg  ? ?Physical Exam: ?General: Middle-age lady, does not appear to be in distress  ?HEENT: Tracheostomy tube in place  ?neuro: Cough and gag ?Chest: Clear breath sounds bilaterally ?Heart: S1-S2 appreciated ?Abdomen: Soft, nontender, nondistended, bowel sounds present ?Skin: No rash ? ?Resolved Hospital Problem list   ?Hyperkalemia, resolved ?Hyponatremia, resolved ? ?Assessment & Plan:  ? ?Pres syndrome with associated status epilepticus ?Acute metabolic encephalopathy secondary to pres ?-Last MRI did show resolution of findings consistent with press ?-Continue Vimpat, seizure precautions ?-Remains encephalopathic ?-On Provigil Will discontinue as mental status did not change with it ?-Continue to wean Precedex ? ?Acute respiratory failure secondary to probable aspiration pneumonia ?-S/p tracheostomy ?-Remains on the ventilator ? ?End-stage renal disease on hemodialysis ?Hyperphosphatemia ?-Continue intermittent hemodialysis ?-Nephrology continues to follow ? ?Chronic heart failure with reduced ejection fraction ?Coronary artery disease/hyperlipidemia ?-Continue carvedilol, hydralazine, aspirin, statin, Zetia ? ?Type 2 diabetes with hyperglycemia ?-Continue SSI ?-Continue Levemir ? ?Hypothyroidism ?-  TSH was 2.7 ?-Continue home Synthroid ? ?Unstageable sacral ulcer ?-Continue wound care ? ? ?Best Practice (right click and "Reselect all SmartList Selections" daily)  ? ?Diet/type: tubefeeds ?DVT prophylaxis: prophylactic  heparin  ?GI prophylaxis: PPI ?Lines: N/A ?Foley:  N/A ?Code Status:  full code ? ?The patient is critically ill with multiple organ systems failure and requires high complexity decision making for assessment and support, frequent evaluation and titration of therapies, application of advanced monitoring technologies and extensive interpretation of multiple databases. Critical Care Time devoted to patient care services described in this note independent of APP/resident time (if applicable)  is 32 minutes.  ? ?Sherrilyn Rist MD ?[facility] ?Personal pager: See Shea Evans ?If unanswered, please page ?CCM On-call: [phone] ?

## 2021-10-21 NOTE — Interval H&P Note (Signed)
History and Physical Interval Note: ? ?10/13/2021 ?7:54 AM ? ?Melanie Hall  has presented today for surgery, with the diagnosis of ESRD.  The various methods of treatment have been discussed with the patient and family. After consideration of risks, benefits and other options for treatment, the patient has consented to  Procedure(s): ?TRACHEOSTOMY (N/A) as a surgical intervention.  The patient's history has been reviewed, patient examined, no change in status, stable for surgery.  I have reviewed the patient's chart and labs.  Questions were answered to the patient's satisfaction.   ? ? ?Izora Gala ? ? ?

## 2021-10-21 NOTE — Anesthesia Postprocedure Evaluation (Signed)
Anesthesia Post Note ? ?Patient: Melanie Hall ? ?Procedure(s) Performed: TRACHEOSTOMY ? ?  ? ?Patient location during evaluation: Other ?Anesthesia Type: General ?Level of consciousness: sedated and patient remains intubated per anesthesia plan ?Pain management: pain level controlled ?Vital Signs Assessment: post-procedure vital signs reviewed and stable ?Respiratory status: patient on ventilator - see flowsheet for VS (via tracheostomy) ?Cardiovascular status: stable ?Postop Assessment: no apparent nausea or vomiting ?Anesthetic complications: no ? ? ?No notable events documented. ? ?Last Vitals:  ?Vitals:  ? 10/14/2021 0811 10/07/2021 0856  ?BP:    ?Pulse:    ?Resp:    ?Temp: 36.6 ?C   ?SpO2:  100%  ?  ?Last Pain:  ?Vitals:  ? 10/27/2021 0811  ?TempSrc: Oral  ?PainSc:   ? ? ?  ?  ?  ?  ?  ?  ? ?Audry Pili ? ? ? ? ?

## 2021-10-21 NOTE — Op Note (Signed)
10/12/2021 ?8:35 AM  ? ?PATIENT:  Melanie Hall, 51 y.o. female ? ?PRE-OPERATIVE DIAGNOSIS: Ventilator dependent respiratory failure ? ?POST-OPERATIVE DIAGNOSIS: Same ? ?PROCEDURE:  Procedure(s): ?TRACHEOSTOMY ? ?SURGEON:  Surgeon(s): ?Beckie Salts, MD ? ?ASSISTANTS: none  ? ?ANESTHESIA:   general ? ?EBL: 40 ? ?DRAINS: none  ? ?LOCAL MEDICATIONS USED:  NONE ? ?COUNTS CORRECT:  YES ? ?PROCEDURE DETAILS: ?Patient was taken to the operating room and placed on the operating table in the supine position. A shoulder roll was placed for positioning. The patient was previously orally intubated. The neck was prepped and draped in a standard fashion.  The incision from the previous attempted percutaneous trach was extended superiorly and inferiorly to approximately 4 cm using electrocautery.   The remaining midline fascia was divided.  Bleeding sites were cauterized with the electrocautery unit.  The isthmus of the thyroid was reflected superiorly and the upper trachea was exposed. A tracheotomy was created between the second and third tracheal rings in a horizontal fashion. A lower tracheal flap was created with scissors and the flap was sutured to the cervical skin using 2-0 chromic suture. The orotracheal tube was removed. The #8 Shiley tracheostomy tube was placed without difficulty and the cuff was inflated. The shield was secured to the neck using a Velcro straps and nylon suture. The patient was then transferred back to the intensive care unit in critical condition. ? ?PLAN OF CARE: Transfer to ICU ? ?PATIENT DISPOSITION:  ICU - hemodynamically stable. ?  ? ?

## 2021-10-22 DIAGNOSIS — I6783 Posterior reversible encephalopathy syndrome: Secondary | ICD-10-CM | POA: Diagnosis not present

## 2021-10-22 DIAGNOSIS — J9601 Acute respiratory failure with hypoxia: Secondary | ICD-10-CM | POA: Diagnosis not present

## 2021-10-22 DIAGNOSIS — R4182 Altered mental status, unspecified: Secondary | ICD-10-CM | POA: Diagnosis not present

## 2021-10-22 LAB — BASIC METABOLIC PANEL
Anion gap: 16 — ABNORMAL HIGH (ref 5–15)
BUN: 79 mg/dL — ABNORMAL HIGH (ref 6–20)
CO2: 19 mmol/L — ABNORMAL LOW (ref 22–32)
Calcium: 8.1 mg/dL — ABNORMAL LOW (ref 8.9–10.3)
Chloride: 97 mmol/L — ABNORMAL LOW (ref 98–111)
Creatinine, Ser: 4.83 mg/dL — ABNORMAL HIGH (ref 0.44–1.00)
GFR, Estimated: 10 mL/min — ABNORMAL LOW (ref >60)
Glucose, Bld: 141 mg/dL — ABNORMAL HIGH (ref 70–99)
Potassium: 4.8 mmol/L (ref 3.5–5.1)
Sodium: 132 mmol/L — ABNORMAL LOW (ref 135–145)

## 2021-10-22 LAB — CBC
HCT: 23.6 % — ABNORMAL LOW (ref 36.0–46.0)
Hemoglobin: 7.9 g/dL — ABNORMAL LOW (ref 12.0–15.0)
MCH: 33.2 pg (ref 26.0–34.0)
MCHC: 33.5 g/dL (ref 30.0–36.0)
MCV: 99.2 fL (ref 80.0–100.0)
Platelets: 312 10*3/uL (ref 150–400)
RBC: 2.38 MIL/uL — ABNORMAL LOW (ref 3.87–5.11)
RDW: 16.9 % — ABNORMAL HIGH (ref 11.5–15.5)
WBC: 9.3 10*3/uL (ref 4.0–10.5)
nRBC: 0.2 % (ref 0.0–0.2)

## 2021-10-22 LAB — GLUCOSE, CAPILLARY
Glucose-Capillary: 112 mg/dL — ABNORMAL HIGH (ref 70–99)
Glucose-Capillary: 123 mg/dL — ABNORMAL HIGH (ref 70–99)
Glucose-Capillary: 129 mg/dL — ABNORMAL HIGH (ref 70–99)
Glucose-Capillary: 132 mg/dL — ABNORMAL HIGH (ref 70–99)
Glucose-Capillary: 139 mg/dL — ABNORMAL HIGH (ref 70–99)
Glucose-Capillary: 151 mg/dL — ABNORMAL HIGH (ref 70–99)
Glucose-Capillary: 166 mg/dL — ABNORMAL HIGH (ref 70–99)
Glucose-Capillary: 179 mg/dL — ABNORMAL HIGH (ref 70–99)
Glucose-Capillary: 191 mg/dL — ABNORMAL HIGH (ref 70–99)
Glucose-Capillary: 193 mg/dL — ABNORMAL HIGH (ref 70–99)
Glucose-Capillary: 217 mg/dL — ABNORMAL HIGH (ref 70–99)
Glucose-Capillary: 217 mg/dL — ABNORMAL HIGH (ref 70–99)
Glucose-Capillary: 225 mg/dL — ABNORMAL HIGH (ref 70–99)
Glucose-Capillary: 229 mg/dL — ABNORMAL HIGH (ref 70–99)

## 2021-10-22 LAB — MAGNESIUM: Magnesium: 2.2 mg/dL (ref 1.7–2.4)

## 2021-10-22 LAB — HEPATITIS B SURFACE ANTIBODY, QUANTITATIVE: Hep B S AB Quant (Post): <3.1 m[IU]/mL — ABNORMAL LOW (ref >9.9)

## 2021-10-22 MED ORDER — INSULIN ASPART 100 UNIT/ML IJ SOLN
5.0000 [IU] | INTRAMUSCULAR | Status: DC
  Administered 2021-10-22 (×4): 5 [IU] via SUBCUTANEOUS

## 2021-10-22 MED ORDER — DEXTROSE 10 % IV SOLN: INTRAVENOUS | Status: DC | PRN

## 2021-10-22 MED ORDER — INSULIN ASPART 100 UNIT/ML IJ SOLN
1.0000 [IU] | INTRAMUSCULAR | Status: DC
  Administered 2021-10-22 (×2): 3 [IU] via SUBCUTANEOUS
  Administered 2021-10-22: 2 [IU] via SUBCUTANEOUS
  Administered 2021-10-22: 3 [IU] via SUBCUTANEOUS

## 2021-10-22 MED ORDER — HEPARIN SODIUM (PORCINE) 5000 UNIT/ML IJ SOLN
5000.0000 [IU] | Freq: Three times a day (TID) | INTRAMUSCULAR | Status: DC
  Administered 2021-10-23 – 2021-11-01 (×30): 5000 [IU] via SUBCUTANEOUS
  Filled 2021-10-22 (×30): qty 1

## 2021-10-22 MED ORDER — INSULIN DETEMIR 100 UNIT/ML ~~LOC~~ SOLN
15.0000 [IU] | Freq: Two times a day (BID) | SUBCUTANEOUS | Status: DC
  Filled 2021-10-22 (×2): qty 0.15

## 2021-10-22 MED ORDER — INSULIN DETEMIR 100 UNIT/ML ~~LOC~~ SOLN
15.0000 [IU] | Freq: Two times a day (BID) | SUBCUTANEOUS | Status: DC
  Administered 2021-10-22 (×2): 15 [IU] via SUBCUTANEOUS
  Filled 2021-10-22 (×3): qty 0.15

## 2021-10-22 MED ORDER — DEXMEDETOMIDINE HCL IN NACL 400 MCG/100ML IV SOLN
0.4000 ug/kg/h | INTRAVENOUS | Status: DC
  Administered 2021-10-22: 0.4 ug/kg/h via INTRAVENOUS
  Administered 2021-10-22 (×2): 1.4 ug/kg/h via INTRAVENOUS
  Administered 2021-10-23: 1 ug/kg/h via INTRAVENOUS
  Administered 2021-10-23: 1.4 ug/kg/h via INTRAVENOUS
  Administered 2021-10-23: 1 ug/kg/h via INTRAVENOUS
  Administered 2021-10-23: 1.4 ug/kg/h via INTRAVENOUS
  Administered 2021-10-23: 0.9 ug/kg/h via INTRAVENOUS
  Administered 2021-10-23: 1.4 ug/kg/h via INTRAVENOUS
  Administered 2021-10-24: 0.8 ug/kg/h via INTRAVENOUS
  Administered 2021-10-24: 0.7 ug/kg/h via INTRAVENOUS
  Filled 2021-10-22 (×10): qty 100

## 2021-10-22 NOTE — Progress Notes (Signed)
? ?NAME:  Melanie ESPEY, MRN:  [id number], DOB:  [birth year], LOS: 50 ?ADMISSION DATE:  09/07/2021, CONSULTATION DATE:  09/14/2021 ?REFERRING MD:  Dr. Roderic Palau, ER, CHIEF COMPLAINT:  AMS  ? ?History of Present Illness:  ?51 yo female former smoker with hx of ESRD on HD was found at her home by patient transport driver unresponsive.  Pt's neighbor noted that she was tapping her hands for about a minute, and then stopped.  She has snoring and drooling.  No improvement after narcan.  CBG normal.  Noted to have BP 215/96.  Intubated in [facility] for airway protection, given keppra load, started on diprivan, and started on antibiotics.  CT head negative for acute findings.  Treated for hyperkalemia.  Pt's daughter report she had similar episode on 09/24/21 and EMS was called to the house, but episode resolve and patient declined hospital evaluation.  No prior history of seizures or known head injury.  ER team d/w case with neurology and advised to transfer to [facility] for EEG monitoring and further neurology assessment. ? ?Pertinent  Medical History  ?CHF, Anemia, OA, CAD, DM type 2, ESRD on HD, HLD, HTN, CVA ? ?Significant Hospital Events: ?Including procedures, antibiotic start and stop dates in addition to other pertinent events   ?3/29 present to [facility], intubated, transfer to [facility] ?3/30 MRI Brain consistent with PRES ?4/3 off sedation, not waking up ?4/4 transitioned off insulin gtt to SSI ?4/5 [place] conversation; see IPAL note ?4/8 MRI with no acute findings ?4/11 trial of extubation failed, she was reintubated, bedside tracheostomy was attempted but failed ?4/12 CT neck was done, negative for acute findings ?4/15-tracheostomy, performed by ENT ? ?Interim History / Subjective:  ?Post tracheostomy-tolerated well ?No overnight events ? ?Objective   ?Blood pressure (!) 99/53, pulse 99, temperature 99 ?F (37.2 ?C), temperature source Axillary, resp. rate (!) 25, height '5\' 3"'$  (1.6 m), weight 84.2 kg, last menstrual period 12/17/2016, SpO2 100  %. ?   ?Vent Mode: PSV;CPAP ?FiO2 (%):  [30 %-40 %] 40 % ?Set Rate:  [30 bmp] 30 bmp ?Vt Set:  [410 mL] 410 mL ?PEEP:  [5 cmH20] 5 cmH20 ?Pressure Support:  [10 cmH20-14 cmH20] 14 cmH20 ?Plateau Pressure:  [17 cmH20] 17 cmH20  ? ?Intake/Output Summary (Last 24 hours) at 10/22/2021 1329 ?Last data filed at 10/22/2021 1200 ?Gross per 24 hour  ?Intake 1960.93 ml  ?Output 2539 ml  ?Net -578.07 ml  ? ?Filed Weights  ? 10/18/21 0645 10/18/21 1046 10/22/21 0810  ?Weight: 79.9 kg 76.5 kg 84.2 kg  ? ?Physical Exam: ?General: Middle-age, does not appear to be in distress  ?HEENT: Tracheostomy tube in place  ?neuro: Cough and gag ?Chest: Clear breath sounds, no added sounds ?Heart: S1-S2 appreciated ?Abdomen: Soft, nontender, nondistended, bowel sounds present ?Skin: No rash ? ?Resolved Hospital Problem list   ?Hyperkalemia, resolved ?Hyponatremia, resolved ? ?Assessment & Plan:  ? ?Pres syndrome with associated status epilepticus ?Acute metabolic encephalopathy secondary to pres ?-Last MRI did show resolution of findings consistent with press ?-Continue Vimpat, seizure precautions ?-She remains encephalopathic ?-Continue to wean Precedex ? ?Acute respiratory failure secondary to probable aspiration pneumonia ?-S/p tracheostomy ?-Tolerating trach mask today ? ?End stage renal disease on hemodialysis ?Hyperphosphatemia ?-Nephrology will continue to follow ?-Continue intermittent hemodialysis ? ?Chronic heart failure with reduced ejection fraction ?Coronary artery disease/hyperlipidemia ?-To continue carvedilol, hydralazine, aspirin, statin, Zetia ? ?Agitation ?-We will continue Precedex ? ?Type 2 diabetes with hyperglycemia ?-Continue SSI ?-Continue Levemir ? ?Hypothyroidism ?-Home Synthroid ? ?Unstageable  sacral ulcer ?-Continue wound care ? ?Best Practice (right click and "Reselect all SmartList Selections" daily)  ? ?Diet/type: tubefeeds ?DVT prophylaxis: prophylactic heparin  ?GI prophylaxis: PPI ?Lines: N/A ?Foley:   N/A ?Code Status:  full code ? ?The patient is critically ill with multiple organ systems failure and requires high complexity decision making for assessment and support, frequent evaluation and titration of therapies, application of advanced monitoring technologies and extensive interpretation of multiple databases. Critical Care Time devoted to patient care services described in this note independent of APP/resident time (if applicable)  is 30 minutes.  ? ?Sherrilyn Rist MD ?[facility] ?Personal pager: See Shea Evans ?If unanswered, please page ?CCM On-call: [phone] ?

## 2021-10-22 NOTE — Progress Notes (Signed)
?[facility] ?Progress Note  ? ?Assessment/ Plan:   ?OP HD: TTS [facility] ?  3h 82mn  75.5kg  2/2.5 bath  400 bfr   Hep none LUE AVF ? - 4/14 > hep B Ag negative and hep B Ab's low/ not protective ? - mircera 200 q2 wks ? - venofer 50 weekly ? - rocaltrol 0.25 tiw ?  ?CXR 4/14 - no active disease ?  ?Assessment/ Plan:  ?AMS - found unresponsive at home. Had PRES and seizures, seen by neurology. MS remains poor. MRI has been improving. Per neuro/ pmd. Sz meds lowered and getting provigil to promote wakefulness.  ?Resp failure - intubated for airway protection.  She wasn't able to be extubated. SP trach 4/15 by ENT. Remains on vent, per CCM.  ?ESRD - on TTS [facility] [place]. Next HD 4/18. Placement for a patient on dialysis with a trach is exceedingly difficult as everyone is aware.  ?Volume - has 1-2+ edema. Wt's seen inaccurate. UF 2.5 yest limited by BP drop. Cont push volume down this week.  ?DM2 - per pmd ?Possible sepsis - sp short course IV abx. BCx x2 NGTD ?Anemia - Hb 8-9s. Was on esa/ IV fe at OP unit. Cont darbe 150 ug q Friday and weekly IV Fe here.  ?CKD-BMM: CCa stable. Cont vdra tiw.  ?Nutrition: getting TF"s at 50 cc/hr ? ?RKelly Splinter MD ?10/22/2021, 8:00 AM ? ? ? ? ? ?Subjective:   ? ?Had HD overnight, 2.5 L off.   ? ?Objective:   ?BP 121/63   Pulse 75   Temp 99 ?F (37.2 ?C) (Axillary)   Resp (!) 30   Ht '5\' 3"'  (1.6 m)   Wt 76.5 kg   LMP 12/17/2016   SpO2 95%   BMI 29.88 kg/m?  ? ?Physical Exam: ?[id number]?CVS: RRR ?Resp: clear anteriorly ?Abd: soft ?Ext: no LE edema ?ACCESS: + T/B LUE AVF ? ? ? ?Labs: ?BMET ?Recent Labs  ?Lab 10/16/21 ?0310 10/17/21 ?0145 10/18/21 ?0433204/12/23 ?0951804/13/23 ?0841604/14/23 ?0606304/30/2023 ?0108 10/13/2021 ?2036 10/22/21 ?0208  ?NA 134*   < > 131* 135 135 138 136 133* 132*  ?K 4.8   < > 6.7* 4.0 4.0 5.3* 4.0 4.9 4.8  ?CL 95*   < > 91* 93* 97* 99 99 98 97*  ?CO2 24   < > 18* 24 21* 20* 24 20* 19*  ?GLUCOSE 225*   < > 232* 253* 457*  161* 204* 320* 141*  ?BUN 57*   < > 112* 61* 65* 100* 48* 73* 79*  ?CREATININE 3.98*   < > 6.62* 3.93* 4.37* 5.83* 3.43* 4.55* 4.83*  ?CALCIUM 8.4*   < > 8.7* 9.0 9.0 8.2* 8.2* 8.1* 8.1*  ?PHOS 6.4*  --   --  4.9* 6.9*  --   --   --   --   ? < > = values in this interval not displayed.  ? ? ?CBC ?Recent Labs  ?Lab 10/18/21 ?0208 10/18/21 ?2152 10/20/21 ?0727 10/20/21 ?1828 10/21/21 ?0016004/16/23 ?01093 ?WBC 10.8*   < > 11.4* 11.2* 9.5 9.3  ?NEUTROABS 9.5*  --   --   --   --   --   ?HGB 8.8*   < > 7.5* 8.1* 7.8* 7.9*  ?HCT 25.6*   < > 22.9* 23.9* 24.4* 23.6*  ?MCV 97.3   < > 100.0 100.0 100.4* 99.2  ?PLT 260   < > 316 335 323 312  ? < > =  values in this interval not displayed.  ? ? ? ?  ?Medications:   ? ? aspirin  81 mg Per Tube Daily  ? atorvastatin  80 mg Per Tube q1800  ? calcitRIOL  0.25 mcg Per Tube Q T,Th,Sat-1800  ? carvedilol  25 mg Per Tube BID WC  ? chlorhexidine gluconate (MEDLINE KIT)  15 mL Mouth Rinse BID  ? Chlorhexidine Gluconate Cloth  6 each Topical Q0600  ? cholestyramine  4 g Per Tube TID  ? darbepoetin (ARANESP) injection - DIALYSIS  150 mcg Intravenous Q Thu-HD  ? docusate  100 mg Per Tube BID  ? enoxaparin (LOVENOX) injection  30 mg Subcutaneous Q24H  ? ezetimibe  10 mg Per Tube Daily  ? feeding supplement (PROSource TF)  45 mL Per Tube QID  ? fiber  1 packet Per Tube TID  ? folic acid  1 mg Per Tube Daily  ? hydrALAZINE  100 mg Per Tube BID  ? insulin aspart  1-3 Units Subcutaneous Q4H  ? insulin aspart  5 Units Subcutaneous Q4H  ? insulin detemir  15 Units Subcutaneous Q12H  ? lacosamide  50 mg Per Tube BID  ? leptospermum manuka honey  1 application. Topical UD  ? levothyroxine  50 mcg Per Tube QAC breakfast  ? mouth rinse  15 mL Mouth Rinse 10 times per day  ? multivitamin  1 tablet Per Tube QHS  ? pantoprazole sodium  40 mg Per Tube Daily  ? polyethylene glycol  17 g Per Tube Daily  ? thiamine  100 mg Per Tube Daily  ? vitamin B-12  1,000 mcg Per Tube Daily  ? ? ? ? ?

## 2021-10-23 ENCOUNTER — Encounter (HOSPITAL_COMMUNITY): Payer: Self-pay | Admitting: Otolaryngology

## 2021-10-23 DIAGNOSIS — J9601 Acute respiratory failure with hypoxia: Secondary | ICD-10-CM | POA: Diagnosis not present

## 2021-10-23 DIAGNOSIS — Z930 Tracheostomy status: Secondary | ICD-10-CM | POA: Diagnosis not present

## 2021-10-23 DIAGNOSIS — I6783 Posterior reversible encephalopathy syndrome: Secondary | ICD-10-CM | POA: Diagnosis not present

## 2021-10-23 LAB — GLUCOSE, CAPILLARY
Glucose-Capillary: 245 mg/dL — ABNORMAL HIGH (ref 70–99)
Glucose-Capillary: 283 mg/dL — ABNORMAL HIGH (ref 70–99)
Glucose-Capillary: 294 mg/dL — ABNORMAL HIGH (ref 70–99)
Glucose-Capillary: 102 mg/dL — ABNORMAL HIGH (ref 70–99)
Glucose-Capillary: 197 mg/dL — ABNORMAL HIGH (ref 70–99)
Glucose-Capillary: 153 mg/dL — ABNORMAL HIGH (ref 70–99)

## 2021-10-23 MED ORDER — INSULIN ASPART 100 UNIT/ML IJ SOLN
5.0000 [IU] | INTRAMUSCULAR | Status: DC
  Administered 2021-10-23 – 2021-11-02 (×53): 5 [IU] via SUBCUTANEOUS

## 2021-10-23 MED ORDER — INSULIN ASPART 100 UNIT/ML IJ SOLN
0.0000 [IU] | INTRAMUSCULAR | Status: DC
  Administered 2021-10-23: 3 [IU] via SUBCUTANEOUS
  Administered 2021-10-23: 8 [IU] via SUBCUTANEOUS
  Administered 2021-10-23: 3 [IU] via SUBCUTANEOUS
  Administered 2021-10-23: 8 [IU] via SUBCUTANEOUS
  Administered 2021-10-24: 2 [IU] via SUBCUTANEOUS
  Administered 2021-10-24: 3 [IU] via SUBCUTANEOUS
  Administered 2021-10-24: 2 [IU] via SUBCUTANEOUS
  Administered 2021-10-24: 3 [IU] via SUBCUTANEOUS
  Administered 2021-10-25 (×3): 2 [IU] via SUBCUTANEOUS
  Administered 2021-10-25: 3 [IU] via SUBCUTANEOUS
  Administered 2021-10-26: 2 [IU] via SUBCUTANEOUS
  Administered 2021-10-26: 5 [IU] via SUBCUTANEOUS
  Administered 2021-10-26: 3 [IU] via SUBCUTANEOUS
  Administered 2021-10-26 (×2): 2 [IU] via SUBCUTANEOUS
  Administered 2021-10-26: 3 [IU] via SUBCUTANEOUS
  Administered 2021-10-27 (×2): 2 [IU] via SUBCUTANEOUS
  Administered 2021-10-27 – 2021-10-28 (×3): 3 [IU] via SUBCUTANEOUS
  Administered 2021-10-28 – 2021-10-29 (×6): 2 [IU] via SUBCUTANEOUS
  Administered 2021-10-30 (×2): 3 [IU] via SUBCUTANEOUS
  Administered 2021-10-30 (×3): 2 [IU] via SUBCUTANEOUS
  Administered 2021-10-31: 3 [IU] via SUBCUTANEOUS
  Administered 2021-10-31 (×2): 2 [IU] via SUBCUTANEOUS
  Administered 2021-10-31: 3 [IU] via SUBCUTANEOUS
  Administered 2021-11-01 (×4): 2 [IU] via SUBCUTANEOUS
  Administered 2021-11-01: 3 [IU] via SUBCUTANEOUS
  Administered 2021-11-02: 2 [IU] via SUBCUTANEOUS

## 2021-10-23 MED ORDER — GABAPENTIN 250 MG/5ML PO SOLN
300.0000 mg | Freq: Three times a day (TID) | ORAL | Status: DC
  Administered 2021-10-23 – 2021-10-24 (×6): 300 mg via ORAL
  Filled 2021-10-23 (×7): qty 6

## 2021-10-23 MED ORDER — INSULIN DETEMIR 100 UNIT/ML ~~LOC~~ SOLN: 15.0000 [IU] | Freq: Every day | SUBCUTANEOUS | Status: DC

## 2021-10-23 MED ORDER — INSULIN DETEMIR 100 UNIT/ML ~~LOC~~ SOLN
15.0000 [IU] | Freq: Two times a day (BID) | SUBCUTANEOUS | Status: DC
  Administered 2021-10-23 – 2021-11-01 (×20): 15 [IU] via SUBCUTANEOUS
  Filled 2021-10-23 (×25): qty 0.15

## 2021-10-23 MED ORDER — HYDRALAZINE HCL 20 MG/ML IJ SOLN: 10.0000 mg | Freq: Four times a day (QID) | INTRAMUSCULAR | Status: DC | PRN

## 2021-10-23 MED ORDER — INSULIN ASPART 100 UNIT/ML IJ SOLN
0.0000 [IU] | INTRAMUSCULAR | Status: DC
  Administered 2021-10-23: 5 [IU] via SUBCUTANEOUS

## 2021-10-23 MED ORDER — INSULIN ASPART 100 UNIT/ML IJ SOLN
5.0000 [IU] | INTRAMUSCULAR | Status: DC
  Administered 2021-10-23: 5 [IU] via SUBCUTANEOUS

## 2021-10-23 NOTE — Progress Notes (Signed)
?[facility] ?Progress Note  ? ?Assessment/ Plan:   ?OP HD: TTS [facility] ?  3h 40mn  75.5kg  2/2.5 bath  400 bfr   Hep none LUE AVF ? - 4/14 > hep B Ag negative and hep B Ab's low/ not protective ? - mircera 200 q2 wks ? - venofer 50 weekly ? - rocaltrol 0.25 tiw ?  ?CXR 4/14 - no active disease ?  ?Assessment/ Plan:  ?AMS - found unresponsive at home. Had PRES and seizures, seen by neurology. MS remains poor. MRI has been improving. Sz meds lowered and getting provigil to promote wakefulness. Per neuro/ pmd.  ?Resp failure - intubated for airway protection.  She wasn't able to be extubated and is now SP trach 4/15 by ENT. Remains on vent, per CCM.  ?ESRD - on TTS [facility]. Next HD 4/18. Placement for a patient on dialysis with a trach is exceedingly difficult as everyone is aware.  ?Volume - has 1-2+ edema. Wt's up. UF 2.5 yest limited by BP drop. Cont push volume down this week as tolerated.  ?DM2 - per pmd ?Possible sepsis - sp short course IV abx. BCx x2 NGTD ?Anemia - Hb 8-9s. Was on esa/ IV fe at OP unit. Cont darbe 150 ug q Friday and weekly IV Fe here.  ?CKD-BMM: CCa stable. Cont vdra tiw.  ?Nutrition: getting TF"s at 50 cc/hr ? ?RKelly Splinter MD ?10/23/2021, 6:57 AM ? ? ? ? ? ?Subjective:   ? ?Had HD overnight, 2.5 L off.   ? ?Objective:   ?BP 104/60   Pulse 67   Temp 98.8 ?F (37.1 ?C) (Axillary)   Resp 18   Ht '5\' 3"'  (1.6 m)   Wt 84.2 kg   LMP 12/17/2016   SpO2 100%   BMI 32.88 kg/m?  ? ?Physical Exam: ?[id number]?CVS: RRR ?Resp: clear anteriorly ?Abd: soft ?Ext: no LE edema ?ACCESS: + T/B LUE AVF ? ? ? ?Labs: ?BMET ?Recent Labs  ?Lab 10/18/21 ?0208 10/18/21 ?0976704/13/23 ?0341904/14/23 ?0379004/13/2023 ?0108 10/25/2021 ?2036 10/22/21 ?0208  ?NA 131* 135 135 138 136 133* 132*  ?K 6.7* 4.0 4.0 5.3* 4.0 4.9 4.8  ?CL 91* 93* 97* 99 99 98 97*  ?CO2 18* 24 21* 20* 24 20* 19*  ?GLUCOSE 232* 253* 457* 161* 204* 320* 141*  ?BUN 112* 61* 65* 100* 48* 73* 79*  ?CREATININE  6.62* 3.93* 4.37* 5.83* 3.43* 4.55* 4.83*  ?CALCIUM 8.7* 9.0 9.0 8.2* 8.2* 8.1* 8.1*  ?PHOS  --  4.9* 6.9*  --   --   --   --   ? ? ?CBC ?Recent Labs  ?Lab 10/18/21 ?0208 10/18/21 ?2152 10/20/21 ?0727 10/20/21 ?1828 10/21/21 ?0240904/16/23 ?07353 ?WBC 10.8*   < > 11.4* 11.2* 9.5 9.3  ?NEUTROABS 9.5*  --   --   --   --   --   ?HGB 8.8*   < > 7.5* 8.1* 7.8* 7.9*  ?HCT 25.6*   < > 22.9* 23.9* 24.4* 23.6*  ?MCV 97.3   < > 100.0 100.0 100.4* 99.2  ?PLT 260   < > 316 335 323 312  ? < > = values in this interval not displayed.  ? ? ? ?  ?Medications:   ? ? aspirin  81 mg Per Tube Daily  ? atorvastatin  80 mg Per Tube q1800  ? calcitRIOL  0.25 mcg Per Tube Q T,Th,Sat-1800  ? carvedilol  25 mg Per Tube BID WC  ?  chlorhexidine gluconate (MEDLINE KIT)  15 mL Mouth Rinse BID  ? Chlorhexidine Gluconate Cloth  6 each Topical Q0600  ? cholestyramine  4 g Per Tube TID  ? darbepoetin (ARANESP) injection - DIALYSIS  150 mcg Intravenous Q Thu-HD  ? ezetimibe  10 mg Per Tube Daily  ? feeding supplement (PROSource TF)  45 mL Per Tube QID  ? fiber  1 packet Per Tube TID  ? folic acid  1 mg Per Tube Daily  ? heparin injection (subcutaneous)  5,000 Units Subcutaneous Q8H  ? hydrALAZINE  100 mg Per Tube BID  ? insulin aspart  0-15 Units Subcutaneous Q4H  ? insulin aspart  5 Units Subcutaneous Q4H  ? insulin detemir  15 Units Subcutaneous BID  ? lacosamide  50 mg Per Tube BID  ? leptospermum manuka honey  1 application. Topical UD  ? levothyroxine  50 mcg Per Tube QAC breakfast  ? mouth rinse  15 mL Mouth Rinse 10 times per day  ? multivitamin  1 tablet Per Tube QHS  ? pantoprazole sodium  40 mg Per Tube Daily  ? thiamine  100 mg Per Tube Daily  ? vitamin B-12  1,000 mcg Per Tube Daily  ? ? ? ? ?

## 2021-10-23 NOTE — Progress Notes (Addendum)
? ?NAME:  Melanie Hall, MRN:  [id number], DOB:  [birth year], LOS: 15 ?ADMISSION DATE:  09/18/2021, CONSULTATION DATE:  09/27/2021 ?REFERRING MD:  Dr. Roderic Palau, ER, CHIEF COMPLAINT:  AMS  ? ?History of Present Illness:  ?51 yo female former smoker with hx of ESRD on HD was found at her home by patient transport driver unresponsive.  Pt's neighbor noted that she was tapping her hands for about a minute, and then stopped.  She has snoring and drooling.  No improvement after narcan.  CBG normal.  Noted to have BP 215/96.  Intubated in [facility] for airway protection, given keppra load, started on diprivan, and started on antibiotics.  CT head negative for acute findings.  Treated for hyperkalemia.  Pt's daughter report she had similar episode on 09/24/21 and EMS was called to the house, but episode resolve and patient declined hospital evaluation.  No prior history of seizures or known head injury.  ER team d/w case with neurology and advised to transfer to [facility] for EEG monitoring and further neurology assessment. ? ?Pertinent  Medical History  ?CHF, Anemia, OA, CAD, DM type 2, ESRD on HD, HLD, HTN, CVA ? ?Significant Hospital Events: ?Including procedures, antibiotic start and stop dates in addition to other pertinent events   ?3/29 present to [facility], intubated, transfer to [facility] ?3/30 MRI Brain consistent with PRES ?4/3 off sedation, not waking up ?4/4 transitioned off insulin gtt to SSI ?4/5 [place] conversation; see IPAL note ?4/8 MRI with no acute findings ?4/11 trial of extubation failed, she was reintubated, bedside tracheostomy was attempted but failed ?4/12 CT neck was done, negative for acute findings ?4/15-tracheostomy, performed by ENT ?4/16 trach collar ? ?Interim History / Subjective:  ? ?Critically ill, tolerated trach collar ?Remains on Precedex with intermittent breakthrough agitation ?Afebrile ? ?Objective   ?Blood pressure (!) 150/69, pulse 66, temperature 98.8 ?F (37.1 ?C), temperature source Axillary, resp. rate 18,  height '5\' 3"'$  (1.6 m), weight 84.2 kg, last menstrual period 12/17/2016, SpO2 99 %. ?   ?FiO2 (%):  [28 %-40 %] 28 %  ? ?Intake/Output Summary (Last 24 hours) at 10/23/2021 0840 ?Last data filed at 10/23/2021 5885 ?Gross per 24 hour  ?Intake 1928.99 ml  ?Output --  ?Net 1928.99 ml  ? ? ?Filed Weights  ? 10/18/21 0645 10/18/21 1046 10/22/21 0810  ?Weight: 79.9 kg 76.5 kg 84.2 kg  ? ?Physical Exam: ?General: Middle-age, no distress trach collar ?HEENT: Tracheostomy tube.  Minimal bleeding ?neuro: Does not follow commands, on Precedex, RASS +1 ?Chest: No accessory muscle use, bilateral ventilated breath sounds.  No accessory muscle use, bilateral ventilated sounds ?Heart: S1-S2 reg ?Abdomen: Soft, nontender, nondistended, bowel sounds present ?Skin: No rash ? ?Labs show low sodium, normal potassium and stable anemia no leukocytosis ? ?Resolved Hospital Problem list   ?Hyperkalemia, resolved ?Hyponatremia, resolved ? ?Assessment & Plan:  ? ?Pres syndrome with associated status epilepticus ?Acute metabolic encephalopathy secondary to pres ?-Last MRI did show resolution of findings consistent with press ?-Continue Vimpat, seizure precautions ?-Continue to wean Precedex ?-Using Provigil per neurology ?-resume gabapentin ? ?Acute respiratory failure secondary to probable aspiration pneumonia ?-S/p tracheostomy ?-Weaned to trach collar ? ?End stage renal disease on hemodialysis ? ?-Nephrology following ?-Continue intermittent hemodialysis TTS schedule, next 4/18 ? ?HFrEF ?Coronary artery disease/hyperlipidemia ?-To continue carvedilol, hydralazine, aspirin, statin, Zetia ? ? ?Type 2 diabetes with hyperglycemia ?-Continue SSI ?-Increase Levemir 15 units every 12 ?-TF coverage ? ?Hypothyroidism ?-Home Synthroid ? ?Unstageable sacral ulcer ?-Continue wound care ? ?  Best Practice (right click and "Reselect all SmartList Selections" daily)  ? ?Diet/type: tubefeeds ?DVT prophylaxis: prophylactic heparin  ?GI prophylaxis: PPI ?Lines:  N/A ?Foley:  N/A ?Code Status:  full code ? ?The patient is critically ill with multiple organ systems failure and requires high complexity decision making for assessment and support, frequent evaluation and titration of therapies, application of advanced monitoring technologies and extensive interpretation of multiple databases. Critical Care Time devoted to patient care services described in this note independent of APP/resident time (if applicable)  is 32 minutes.  ? ?Kara Mead MD. Shade Flood. ?[facility] ?Pager : [phone] ? ?If no response to pager , please call [phone] until 7 pm ?After 7:00 pm call Elink  [phone]  ? ?10/23/2021 ? ?

## 2021-10-23 NOTE — TOC Progression Note (Signed)
Transition of Care (TOC) - Initial/Assessment Note  ? ? ?Patient Details  ?Name: Melanie Hall ?MRN: [id number] ?Date of Birth: Nov 07, 1970 ? ?Transition of Care (TOC) CM/SW Contact:    ?Paulene Floor Myrka Sylva, LCSWA ?Phone Number: ?10/23/2021, 4:35 PM ? ?Clinical Narrative:                 ?Patient is on trach collar, but still not responding. ? ?TOC following patient for d/c planning needs once medically stable. ? ?Lind Covert, MSW, LCSWA  ? ?  ?  ? ? ?Patient Goals and CMS Choice ?  ?  ?  ? ?Expected Discharge Plan and Services ?  ?  ?  ?  ?  ?                ?  ?  ?  ?  ?  ?  ?  ?  ?  ?  ? ?Prior Living Arrangements/Services ?  ?  ?  ?       ?  ?  ?  ?  ? ?Activities of Daily Living ?  ?  ? ?Permission Sought/Granted ?  ?  ?   ?   ?   ?   ? ?Emotional Assessment ?  ?  ?  ?  ?  ?  ? ?Admission diagnosis:  Altered mental status [R41.82] ?Coma, unspecified coma depth ([place]) [R40.20] ?Patient Active Problem List  ? Diagnosis Date Noted  ? Goals of care, counseling/discussion   ? PRES (posterior reversible encephalopathy syndrome)   ? ESRD (end stage renal disease) ([place])   ? Acute respiratory failure ([place])   ? Secondary hypertension   ? Type 2 diabetes mellitus with hyperglycemia (HCC)   ? Pressure injury of skin 10/05/2021  ? Altered mental status 10/03/2021  ? Swelling of left lower extremity 09/20/2021  ? CAD (coronary artery disease) 02/17/2021  ? Ischemic cardiomyopathy 02/17/2021  ? S/P ankle fusion 06/01/2020  ? Pilon fracture of left tibia, closed, initial encounter   ? Displaced trimalleolar fracture of left lower leg, initial encounter for closed fracture   ? Abnormality of gait and mobility 05/26/2020  ? Migraine 05/26/2020  ? Diabetic retinopathy ([place]) 05/04/2020  ? Chronic diarrhea of unknown origin 01/14/2020  ? Type 2 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, with long-term current use of insulin ([place]) 12/29/2019  ? Hypothyroidism 12/29/2019  ? Weakness of both lower extremities  11/12/2019  ? Debility 09/10/2019  ? ESRD (end stage renal disease) on dialysis ([place]) 09/10/2019  ? Depression 09/10/2019  ? Incontinence in female 09/10/2019  ? Anemia in chronic kidney disease 08/19/2019  ? MGUS (monoclonal gammopathy of unknown significance) 07/23/2019  ? GERD (gastroesophageal reflux disease) 02/28/2019  ? Essential hypertension 10/28/2018  ? Irregular menses 09/14/2009  ? Hypercholesterolemia 11/19/2008  ? Type 2 diabetes with nephropathy ([place]) 07/02/2007  ? ?PCP:  Renee Rival, FNP ?Pharmacy:   ?[place], [place] [address] ?[address] ?[place] [address] ?Phone: [phone] Fax: [phone] ? ?[place], [place] ?[place] [address] ?Phone: [phone] Fax: [phone] ? ? ? ? ?Social Determinants of Health (SDOH) Interventions ?  ? ?Readmission Risk Interventions ?   ? View : No data to display.  ?  ?  ?  ? ? ? ?

## 2021-10-23 NOTE — Progress Notes (Signed)
eLink Physician-Brief Progress Note ?Patient Name: Melanie Hall ?DOB: 1970/12/31 ?MRN: [id number] ? ? ?Date of Service ? 10/23/2021  ?HPI/Events of Note ? Hyperglycemia - Blood glucose = 225 --> 229 --> 245. Currently on Levemir + Q 4 hour sensitive Novolog SSI + Q 4 hour Novolog 5 units tube feeding coverage.  ?eICU Interventions ? Will change insulin regimen to Levemir 15 units [place] Q 12 hours + Q 4 hour moderate Novolog SSI + Novolog 5 units Q 4 hours tube feed coverage.   ? ? ? ?Intervention Category ?Major Interventions: Hyperglycemia - active titration of insulin therapy ? ?Shanna Strength Cornelia Copa ?10/23/2021, 3:51 AM ?

## 2021-10-24 DIAGNOSIS — Z930 Tracheostomy status: Secondary | ICD-10-CM | POA: Diagnosis not present

## 2021-10-24 DIAGNOSIS — I6783 Posterior reversible encephalopathy syndrome: Secondary | ICD-10-CM | POA: Diagnosis not present

## 2021-10-24 DIAGNOSIS — G9340 Encephalopathy, unspecified: Secondary | ICD-10-CM | POA: Diagnosis not present

## 2021-10-24 DIAGNOSIS — J9600 Acute respiratory failure, unspecified whether with hypoxia or hypercapnia: Secondary | ICD-10-CM | POA: Diagnosis not present

## 2021-10-24 LAB — CBC WITH DIFFERENTIAL/PLATELET
Abs Immature Granulocytes: 0.37 10*3/uL — ABNORMAL HIGH (ref 0.00–0.07)
Basophils Absolute: 0 10*3/uL (ref 0.0–0.1)
Basophils Relative: 0 %
Eosinophils Absolute: 0.8 10*3/uL — ABNORMAL HIGH (ref 0.0–0.5)
Eosinophils Relative: 8 %
HCT: 21.1 % — ABNORMAL LOW (ref 36.0–46.0)
Hemoglobin: 7 g/dL — ABNORMAL LOW (ref 12.0–15.0)
Immature Granulocytes: 4 %
Lymphocytes Relative: 17 %
Lymphs Abs: 1.7 10*3/uL (ref 0.7–4.0)
MCH: 34 pg (ref 26.0–34.0)
MCHC: 33.2 g/dL (ref 30.0–36.0)
MCV: 102.4 fL — ABNORMAL HIGH (ref 80.0–100.0)
Monocytes Absolute: 1.1 10*3/uL — ABNORMAL HIGH (ref 0.1–1.0)
Monocytes Relative: 11 %
Neutro Abs: 6.2 10*3/uL (ref 1.7–7.7)
Neutrophils Relative %: 60 %
Platelets: 280 10*3/uL (ref 150–400)
RBC: 2.06 MIL/uL — ABNORMAL LOW (ref 3.87–5.11)
RDW: 17 % — ABNORMAL HIGH (ref 11.5–15.5)
WBC: 10.3 10*3/uL (ref 4.0–10.5)
nRBC: 0.2 % (ref 0.0–0.2)

## 2021-10-24 LAB — BASIC METABOLIC PANEL
Anion gap: 17 — ABNORMAL HIGH (ref 5–15)
BUN: 75 mg/dL — ABNORMAL HIGH (ref 6–20)
CO2: 19 mmol/L — ABNORMAL LOW (ref 22–32)
Calcium: 8.2 mg/dL — ABNORMAL LOW (ref 8.9–10.3)
Chloride: 94 mmol/L — ABNORMAL LOW (ref 98–111)
Creatinine, Ser: 4.79 mg/dL — ABNORMAL HIGH (ref 0.44–1.00)
GFR, Estimated: 10 mL/min — ABNORMAL LOW (ref >60)
Glucose, Bld: 134 mg/dL — ABNORMAL HIGH (ref 70–99)
Potassium: 5.1 mmol/L (ref 3.5–5.1)
Sodium: 130 mmol/L — ABNORMAL LOW (ref 135–145)

## 2021-10-24 LAB — GLUCOSE, CAPILLARY
Glucose-Capillary: 120 mg/dL — ABNORMAL HIGH (ref 70–99)
Glucose-Capillary: 125 mg/dL — ABNORMAL HIGH (ref 70–99)
Glucose-Capillary: 129 mg/dL — ABNORMAL HIGH (ref 70–99)
Glucose-Capillary: 148 mg/dL — ABNORMAL HIGH (ref 70–99)
Glucose-Capillary: 162 mg/dL — ABNORMAL HIGH (ref 70–99)
Glucose-Capillary: 186 mg/dL — ABNORMAL HIGH (ref 70–99)

## 2021-10-24 LAB — PREPARE RBC (CROSSMATCH)

## 2021-10-24 MED ORDER — QUETIAPINE FUMARATE 50 MG PO TABS
50.0000 mg | ORAL_TABLET | Freq: Two times a day (BID) | ORAL | Status: DC
  Filled 2021-10-24: qty 1

## 2021-10-24 MED ORDER — CLONIDINE HCL 0.2 MG PO TABS
0.2000 mg | ORAL_TABLET | Freq: Four times a day (QID) | ORAL | Status: AC
  Administered 2021-10-24 – 2021-10-25 (×4): 0.2 mg
  Filled 2021-10-24 (×4): qty 1

## 2021-10-24 MED ORDER — NEPRO/CARBSTEADY PO LIQD
1000.0000 mL | ORAL | Status: DC
  Administered 2021-10-24 – 2021-10-25 (×2): 1000 mL
  Filled 2021-10-24 (×13): qty 1000

## 2021-10-24 MED ORDER — PROSOURCE TF PO LIQD
45.0000 mL | Freq: Three times a day (TID) | ORAL | Status: DC
  Administered 2021-10-24 – 2021-11-01 (×26): 45 mL
  Filled 2021-10-24 (×25): qty 45

## 2021-10-24 MED ORDER — SODIUM CHLORIDE 0.9% IV SOLUTION: Freq: Once | INTRAVENOUS | Status: AC

## 2021-10-24 MED ORDER — CLONIDINE HCL 0.2 MG PO TABS
0.2000 mg | ORAL_TABLET | Freq: Four times a day (QID) | ORAL | Status: DC
  Filled 2021-10-24: qty 1

## 2021-10-24 MED ORDER — GABAPENTIN 250 MG/5ML PO SOLN
300.0000 mg | Freq: Three times a day (TID) | ORAL | Status: DC
  Administered 2021-10-25 – 2021-11-02 (×25): 300 mg
  Filled 2021-10-24 (×32): qty 6

## 2021-10-24 MED ORDER — GERHARDT'S BUTT CREAM
TOPICAL_CREAM | Freq: Four times a day (QID) | CUTANEOUS | Status: DC | PRN
  Filled 2021-10-24: qty 1

## 2021-10-24 MED ORDER — QUETIAPINE FUMARATE 50 MG PO TABS
50.0000 mg | ORAL_TABLET | Freq: Two times a day (BID) | ORAL | Status: DC
  Administered 2021-10-24 – 2021-11-01 (×18): 50 mg
  Filled 2021-10-24 (×17): qty 1

## 2021-10-24 NOTE — Progress Notes (Signed)
Nutrition Follow-up ? ?DOCUMENTATION CODES:  ? ?Not applicable ? ?INTERVENTION:  ? ?Tube feeding via Cortrak tube: ?- Change to Nepro @ 45 ml/hr (1080 ml/day) ?- ProSource TF 45 ml TID ? ?Tube feeding regimen provides 2064 kcal, 120 grams of protein, and 785 ml of H2O.  ? ?- Continue renal MVI daily per tube ? ?NUTRITION DIAGNOSIS:  ? ?Inadequate oral intake related to inability to eat as evidenced by NPO status. ? ?Ongoing, being addressed via TF ? ?GOAL:  ? ?Patient will meet greater than or equal to 90% of their needs ? ?Met via TF ? ?MONITOR:  ? ?Labs, Weight trends, TF tolerance, Skin, I & O's ? ?REASON FOR ASSESSMENT:  ? ?Ventilator, Consult ?Enteral/tube feeding initiation and management ? ?ASSESSMENT:  ? ?51 year old female who presented to the ED on 3/29 with seizure-like activity. PMH of ESRD on HD, CHF, anemia, CAD, T2DM, HLD, HTN, CVA, GERD. Pt required intubation for airway protection. Pt admitted with acute metabolic encephalopathy, probable aspiration PNA. ? ?03/30 - tube feeding protocol started ?04/11 - tracheostomy attempted but unsuccessful ?04/15 - s/p tracheostomy in OR with ENT ? ?Discussed pt with RN and during ICU rounds. Pt has been weaned to trach collar and has tolerated for >24 hours. Pt has been requiring precedex drip due to agitation. ? ?Pt currently above EDW. Pt due for iHD today. Last iHD was on 4/15 with 2500 ml net UF. ? ?EDW: 75.5 kg ?Admit weight: 83.9 kg ?Current weight: 84.2 kg ? ?Current TF: Vital 1.5 @ 50 ml/hr, ProSource TF 45 ml QID ? ?Medications reviewed and include: calcitriol, questran 4 grams TID, aranesp, nutrisource fiber TID, folic acid, SSI q 4 hours, novolog 5 units q 4 hours, levemir 15 units BID, rena-vit, protonix, thiamine, vitamin B-12 1000 mcg daily, precedex drip, IV ferric gluconate ? ?Labs reviewed: sodium 130, phosphorus 6.9 on 4/13, hemoglobin 7.0 ?CBG's: 102-197 x 24 hours ? ?I/O's: +14.5 L since admit ? ?Diet Order:   ?Diet Order   ? ?       ?  Diet  NPO time specified  Diet effective now       ?  ? ?  ?  ? ?  ? ? ?EDUCATION NEEDS:  ? ?Not appropriate for education at this time ? ?Skin:  Skin Assessment: ?Skin Integrity Issues: ?DTI: R foot toe ?Stage I: sacrum ?Stage III: anus ?Incisions: neck ?Other: MASD pelvis ? ?Last BM:  10/23/21 type 6 ? ?Height:  ? ?Ht Readings from Last 1 Encounters:  ?09/10/2021 _0  (1.6 m)  ? ? ?Weight:  ? ?Wt Readings from Last 1 Encounters:  ?10/22/21 84.2 kg  ? ? ?Ideal Body Weight:  52.3 kg ? ?BMI:  Body mass index is 32.88 kg/m?. ? ?Estimated Nutritional Needs:  ? ?Kcal:  2000-2200 ? ?Protein:  115-135 grams ? ?Fluid:  1000 ml + UOP ? ? ? ?Gustavus Bryant, MS, RD, LDN ?Inpatient Clinical Dietitian ?Please see AMiON for contact information. ? ?

## 2021-10-24 NOTE — Progress Notes (Deleted)
Cardiology Office Note    Date:  10/24/2021   ID:  RHYA SHAN, DOB 01-21-1971, MRN [id number]   PCP:  Renee Rival, [place]  Cardiologist:  Sherren Mocha, MD *** Advanced Practice Provider:  Liliane Shi, PA-C Electrophysiologist:  None   [id number]}   No chief complaint on file.   History of Present Illness:  Melanie Hall is a 51 y.o. female ***    Past Medical History:  Diagnosis Date   Anemia    Arthritis    CAD (coronary artery disease)    S/p late presenting Inf STEMI 10/2019 >> PCI: DES x 2 to RCA // Myoview 8/22: EF 54, no ischemia or infarction; low risk   Cataracts, bilateral    surgery to remove   CATARACTS, BILATERAL 07/02/2007   Qualifier: Diagnosis of  By: Isla Pence     Closed fracture of left femur ([place]) 10/28/2018   Closed fracture of right ankle 11/06/2017   Diabetes mellitus    type 2   Emphysematous cystitis 08/26/2018   ESRD on hemodialysis ([place])    MWF - in [place]   GERD (gastroesophageal reflux disease)    Hyperlipidemia    Hypertension    IRREGULAR MENSES 09/14/2009   Qualifier: Diagnosis of  By: Hassell Done FNP, [place]     Ischemic cardiomyopathy    Echocardiogram 10/19/2019: EF 40-45, inf WMA, Gr 1 DD, Lg L pl Eff // EF 54 by Myoview in 8/22   PARONYCHIA, RIGHT GREAT TOE 07/30/2008   Qualifier: Diagnosis of  By: Hassell Done FNP, Nykedtra     Pressure ulcer 09/10/2019   Right arm weakness 08/08/2019   STEMI involving right coronary artery ([place]) 10/18/2019   Stroke ([place]) 10/2019   SVD (spontaneous vaginal delivery)    x 4   Vaginosis 08/26/2018   Weakness of both lower extremities     Past Surgical History:  Procedure Laterality Date   A/V FISTULAGRAM Left 01/12/2020   Procedure: A/V FISTULAGRAM;  Surgeon: Serafina Mitchell, MD;  Location: [facility];  Service: Cardiovascular;  Laterality: Left;   A/V FISTULAGRAM N/A 03/17/2020   Procedure: A/V FISTULAGRAM - Left Arm;  Surgeon:  Marty Heck, MD;  Location: [facility];  Service: Cardiovascular;  Laterality: N/A;   A/V FISTULAGRAM Left 06/23/2020   Procedure: A/V FISTULAGRAM;  Surgeon: Marty Heck, MD;  Location: [facility];  Service: Cardiovascular;  Laterality: Left;   ANKLE FUSION Left 06/01/2020   Procedure: LEFT ANKLE FUSION;  Surgeon: Newt Minion, MD;  Location: [facility];  Service: Orthopedics;  Laterality: Left;   AV FISTULA PLACEMENT Left 08/17/2019   Procedure: LEFT BRACHIAL CEPHALIC ARTERIOVENOUS (AV) FISTULA;  Surgeon: Angelia Mould, MD;  Location: [facility];  Service: Vascular;  Laterality: Left;   BIOPSY  02/28/2021   Procedure: BIOPSY;  Surgeon: Harvel Quale, MD;  [facility];  Service: Gastroenterology;;  duodenum gastric esophagus colon   COLONOSCOPY WITH PROPOFOL N/A 02/28/2021   Procedure: COLONOSCOPY WITH PROPOFOL;  Surgeon: Harvel Quale, MD;  [facility];  Service: Gastroenterology;  Laterality: N/A;  11:00   CORONARY STENT INTERVENTION N/A 10/18/2019   Procedure: CORONARY STENT INTERVENTION;  Surgeon: Sherren Mocha, MD;  Location: [facility];  Service: Cardiovascular;  Laterality: N/A;   CORONARY/GRAFT ACUTE MI REVASCULARIZATION N/A 10/18/2019   Procedure: Coronary/Graft Acute MI Revascularization;  Surgeon: Sherren Mocha, MD;  Location: [facility]  INVASIVE CV LAB;  Service: Cardiovascular;  Laterality: N/A;   ESOPHAGOGASTRODUODENOSCOPY (EGD) WITH PROPOFOL N/A 02/28/2021   Procedure: ESOPHAGOGASTRODUODENOSCOPY (EGD) WITH PROPOFOL;  Surgeon: Harvel Quale, MD;  [facility];  Service: Gastroenterology;  Laterality: N/A;   EYE SURGERY Bilateral    cataracts removed   FEMUR IM NAIL Left 10/28/2018   Procedure: RETROGRADE FEMORAL NAILING;  Surgeon: Meredith Pel, MD;  Location: [facility];  Service: Orthopedics;  Laterality: Left;   IM NAILING FEMORAL SHAFT RETROGRADE Left 10/28/2018    INTRAVASCULAR ULTRASOUND/IVUS N/A 10/18/2019   Procedure: Intravascular Ultrasound/IVUS;  Surgeon: Sherren Mocha, MD;  Location: [facility] [facility];  Service: Cardiovascular;  Laterality: N/A;   IR FLUORO GUIDE CV LINE RIGHT  08/11/2019   IR THORACENTESIS ASP PLEURAL SPACE W/IMG GUIDE  11/12/2019   IR US GUIDE VASC ACCESS RIGHT  08/11/2019   KNEE SURGERY Left    LEFT HEART CATH AND CORONARY ANGIOGRAPHY N/A 10/18/2019   Procedure: LEFT HEART CATH AND CORONARY ANGIOGRAPHY;  Surgeon: Sherren Mocha, MD;  Location: [facility];  Service: Cardiovascular;  Laterality: N/A;   PERIPHERAL VASCULAR BALLOON ANGIOPLASTY Left 01/12/2020   Procedure: PERIPHERAL VASCULAR BALLOON ANGIOPLASTY;  Surgeon: Serafina Mitchell, MD;  Location: [facility];  Service: Cardiovascular;  Laterality: Left;  AVF   PERIPHERAL VASCULAR BALLOON ANGIOPLASTY Left 03/17/2020   Procedure: PERIPHERAL VASCULAR BALLOON ANGIOPLASTY;  Surgeon: Marty Heck, MD;  Location: [facility];  Service: Cardiovascular;  Laterality: Left;  AVF   PERIPHERAL VASCULAR BALLOON ANGIOPLASTY Left 05/05/2020   Procedure: PERIPHERAL VASCULAR BALLOON ANGIOPLASTY;  Surgeon: Marty Heck, MD;  Location: [facility];  Service: Cardiovascular;  Laterality: Left;  arm fistula   RADIOLOGY WITH ANESTHESIA N/A 09/15/2019   Procedure: [facility] AND LUMBER LOWER BACK PAIN;  Surgeon: Radiologist, Medication, MD;  Location: [place];  Service: Radiology;  Laterality: N/A;   TRACHEOSTOMY TUBE PLACEMENT N/A 11/05/2021   Procedure: TRACHEOSTOMY;  Surgeon: Izora Gala, MD;  Location: [facility];  Service: ENT;  Laterality: N/A;   TUBAL LIGATION      Current Medications: Current Facility-Administered Medications for the 11/01/21 encounter (Appointment) with Imogene Burn, PA-C  Medication   0.9 %  sodium chloride infusion   0.9 %  sodium chloride infusion   sodium chloride flush (NS) 0.9 % injection 3 mL   sodium chloride flush (NS) 0.9 %  injection 3 mL   No outpatient medications have been marked as taking for the 11/01/21 encounter (Appointment) with Imogene Burn, PA-C.     Allergies:   Contrast media [iodinated contrast media] and Penicillins   Social History   Socioeconomic History   Marital status: Widowed    Spouse name: Not on file   Number of children: 4   Years of education: Not on file   Highest education level: 10th grade  Occupational History   Not on file  Tobacco Use   Smoking status: Former    Packs/day: 0.25    Years: 2.00    Pack years: 0.50    Types: Cigarettes    Quit date: 48    Years since quitting: 26.3   Smokeless tobacco: Never  Vaping Use   Vaping Use: Never used  Substance and Sexual Activity   Alcohol use: No   Drug use: No   Sexual activity: Yes    Birth control/protection: Surgical    Comment: tubal   Other Topics Concern   Not on file  Social History Narrative   Lives with friend with Joelene Millin   Cats: Tiger, Malcom, [place], another kitten      Enjoy: watching TV-[employer], [employer], and [employer]      Diet: eats all food groups: focuses on Dm and Kidney friendly foods    Caffeine: none   Water: Fluid Restriction 32 ounces a day          Wears seat belt: not currently driving    Oceanographer at home    No weapons    Social Determinants of Radio broadcast assistant Strain: Not on file  Food Insecurity: Not on file  Transportation Needs: Unmet Transportation Needs   Lack of Transportation (Medical): Yes   Lack of Transportation (Non-Medical): Yes  Physical Activity: Not on file  Stress: Not on file  Social Connections: Not on file     Family History:  The patient's ***family history includes Bipolar disorder in her mother; Brain cancer in her maternal aunt; Breast cancer in her maternal aunt; Depression in her father; Diabetes in her maternal grandmother, mother, and sister; Heart disease in her maternal grandmother; Hypertension in her mother.   ROS:    Please see the history of present illness.    ROS All other systems reviewed and are negative.   PHYSICAL EXAM:   VS:  LMP 12/17/2016   Physical Exam  GEN: Well nourished, well developed, in no acute distress  HEENT: normal  Neck: no JVD, carotid bruits, or masses Cardiac:RRR; no murmurs, rubs, or gallops  Respiratory:  clear to auscultation bilaterally, normal work of breathing GI: soft, nontender, nondistended, + BS Ext: without cyanosis, clubbing, or edema, Good distal pulses bilaterally MS: no deformity or atrophy  Skin: warm and dry, no rash Neuro:  Alert and Oriented x 3, Strength and sensation are intact Psych: euthymic mood, full affect  Wt Readings from Last 3 Encounters:  10/22/21 185 lb 10 oz (84.2 kg)  09/20/21 185 lb (83.9 kg)  07/31/21 181 lb 14.4 oz (82.5 kg)      Studies/Labs Reviewed:   EKG:  EKG is*** ordered today.  The ekg ordered today demonstrates ***  Recent Labs: 09/29/2021: TSH 2.732 10/18/2021: ALT 94 10/22/2021: Magnesium 2.2 10/24/2021: BUN 75; Creatinine, Ser 4.79; Hemoglobin 7.0; Platelets 280; Potassium 5.1; Sodium 130   Lipid Panel    Component Value Date/Time   CHOL 123 09/20/2021 0918   TRIG 141 10/09/2021 0138   HDL 34 (L) 09/20/2021 0918   CHOLHDL 3.6 09/20/2021 0918   CHOLHDL 3.4 10/19/2019 0034   VLDL 27 10/19/2019 0034   LDLCALC 65 09/20/2021 0918    Additional studies/ records that were reviewed today include:  ***   Risk Assessment/Calculations:   {Does this patient have ATRIAL FIBRILLATION?:[phone]}     ASSESSMENT:    No diagnosis found.   PLAN:  In order of problems listed above:    Shared Decision Making/Informed Consent   {Are you ordering a CV Procedure (e.g. stress test, cath, DCCV, TEE, etc)?   Press F2        :[id number]}    Medication Adjustments/Labs and Tests Ordered: Current medicines are reviewed at length with the patient today.  Concerns regarding medicines are outlined above.  Medication  changes, Labs and Tests ordered today are listed in the Patient Instructions below. There are no Patient Instructions on file for this visit.   Sumner Boast, PA-C  10/24/2021 7:56 AM    [facility] [address],  [place], [place]  [address] Phone: [phone]; Fax: [phone]

## 2021-10-24 NOTE — Progress Notes (Addendum)
?[facility] ?Progress Note  ? ?Assessment/ Plan:   ?OP HD: TTS [facility] ?  3h 50mn  75.5kg  2/2.5 bath  400 bfr   Hep none LUE AVF ? - 4/14 > hep B Ag negative and hep B Ab's low/ not protective ? - mircera 200 q2 wks ? - venofer 50 weekly ? - rocaltrol 0.25 tiw ?  ?CXR 4/14 - no active disease ?  ?Assessment/ Plan:  ?AMS - found unresponsive at home. Had PRES and seizures, seen by neurology. MS remains poor. MRI has been improving. Sz meds lowered and getting provigil to promote wakefulness. Per neuro/ pmd.  ?Resp failure - intubated for airway protection.  She wasn't able to be extubated and is now SP trach 4/15 by ENT. Remains on vent, per CCM.  ?ESRD - on TTS [facility]. Next HD today.  ?Volume - Wt's up but not c/w exam. Last CXR neg. Get hoyer wts. No vol excess on exam and not getting large amts of volume (TF's at 50/hr).   ?DM2 - per pmd ?Possible sepsis - sp short course IV abx. BCx x2 NGTD ?Anemia - Hb 8-9s. Was on esa/ IV fe at OP unit. Cont darbe 150 ug q Friday and weekly IV Fe here.  ?CKD-BMM: CCa stable. Cont vdra tiw.  ?Nutrition: getting TF"s at 50 cc/hr ?Dispo - placement for a patient on dialysis with a trach is exceedingly difficult as everyone is aware. ? ?RKelly Splinter MD ?10/24/2021, 7:45 AM ? ? ? ? ? ?Subjective:   ? ?Had HD overnight, 2.5 L off.   ? ?Objective:   ?BP (!) 120/53   Pulse 66   Temp 98.2 ?F (36.8 ?C) (Axillary)   Resp 13   Ht 5' 3" (1.6 m)   Wt 84.2 kg   LMP 12/17/2016   SpO2 100%   BMI 32.88 kg/m?  ? ?Physical Exam: ?GVFI:EPPIRJJOA?CVS: RRR ?Resp: clear anteriorly ?Abd: soft ?Ext: no LE edema ?ACCESS: + T/B LUE AVF ? ? ? ?Labs: ?BMET ?Recent Labs  ?Lab 10/18/21 ?0416604/13/23 ?0063004/14/23 ?0160104/09/2021 ?0108 10/15/2021 ?2036 10/22/21 ?0093204/18/23 ?0251  ?NA 135 135 138 136 133* 132* 130*  ?K 4.0 4.0 5.3* 4.0 4.9 4.8 5.1  ?CL 93* 97* 99 99 98 97* 94*  ?CO2 24 21* 20* 24 20* 19* 19*  ?GLUCOSE 253* 457* 161* 204* 320* 141* 134*  ?BUN 61*  65* 100* 48* 73* 79* 75*  ?CREATININE 3.93* 4.37* 5.83* 3.43* 4.55* 4.83* 4.79*  ?CALCIUM 9.0 9.0 8.2* 8.2* 8.1* 8.1* 8.2*  ?PHOS 4.9* 6.9*  --   --   --   --   --   ? ? ?CBC ?Recent Labs  ?Lab 10/18/21 ?0208 10/18/21 ?2152 10/20/21 ?1828 10/08/2021 ?0355704/16/23 ?0322004/18/23 ?0251  ?WBC 10.8*   < > 11.2* 9.5 9.3 10.3  ?NEUTROABS 9.5*  --   --   --   --  6.2  ?HGB 8.8*   < > 8.1* 7.8* 7.9* 7.0*  ?HCT 25.6*   < > 23.9* 24.4* 23.6* 21.1*  ?MCV 97.3   < > 100.0 100.4* 99.2 102.4*  ?PLT 260   < > 335 323 312 280  ? < > = values in this interval not displayed.  ? ? ? ?  ?Medications:   ? ? aspirin  81 mg Per Tube Daily  ? atorvastatin  80 mg Per Tube q1800  ? calcitRIOL  0.25 mcg Per Tube Q T,Th,Sat-1800  ? carvedilol  25 mg Per Tube BID WC  ? chlorhexidine gluconate (MEDLINE KIT)  15 mL Mouth Rinse BID  ? Chlorhexidine Gluconate Cloth  6 each Topical Q0600  ? cholestyramine  4 g Per Tube TID  ? darbepoetin (ARANESP) injection - DIALYSIS  150 mcg Intravenous Q Thu-HD  ? ezetimibe  10 mg Per Tube Daily  ? feeding supplement (PROSource TF)  45 mL Per Tube QID  ? fiber  1 packet Per Tube TID  ? folic acid  1 mg Per Tube Daily  ? gabapentin  300 mg Oral Q8H  ? heparin injection (subcutaneous)  5,000 Units Subcutaneous Q8H  ? hydrALAZINE  100 mg Per Tube BID  ? insulin aspart  0-15 Units Subcutaneous Q4H  ? insulin aspart  5 Units Subcutaneous Q4H  ? insulin detemir  15 Units Subcutaneous BID  ? lacosamide  50 mg Per Tube BID  ? leptospermum manuka honey  1 application. Topical UD  ? levothyroxine  50 mcg Per Tube QAC breakfast  ? mouth rinse  15 mL Mouth Rinse 10 times per day  ? multivitamin  1 tablet Per Tube QHS  ? pantoprazole sodium  40 mg Per Tube Daily  ? thiamine  100 mg Per Tube Daily  ? vitamin B-12  1,000 mcg Per Tube Daily  ? ? ? ? ?

## 2021-10-24 NOTE — Progress Notes (Signed)
? ?NAME:  Melanie Hall, MRN:  [id number], DOB:  1971-04-16, LOS: 61 ?ADMISSION DATE:  09/15/2021, CONSULTATION DATE:  09/20/2021 ?REFERRING MD:  Dr. Roderic Palau, ER, CHIEF COMPLAINT:  AMS  ? ?History of Present Illness:  ?51 yo female former smoker with hx of ESRD on HD was found at her home by patient transport driver unresponsive.  Pt's neighbor noted that she was tapping her hands for about a minute, and then stopped.  She has snoring and drooling.  No improvement after narcan.  CBG normal.  Noted to have BP 215/96.  Intubated in [facility] for airway protection, given keppra load, started on diprivan, and started on antibiotics.  CT head negative for acute findings.  Treated for hyperkalemia.  Pt's daughter report she had similar episode on 09/24/21 and EMS was called to the house, but episode resolve and patient declined hospital evaluation.  No prior history of seizures or known head injury.  ER team d/w case with neurology and advised to transfer to [facility] for EEG monitoring and further neurology assessment. ? ?Pertinent  Medical History  ?CHF, Anemia, OA, CAD, DM type 2, ESRD on HD, HLD, HTN, CVA ? ?Significant Hospital Events: ?Including procedures, antibiotic start and stop dates in addition to other pertinent events   ?3/29 present to [facility], intubated, transfer to [facility] ?3/30 MRI Brain consistent with PRES ?4/3 off sedation, not waking up ?4/4 transitioned off insulin gtt to SSI ?4/5 [place] conversation; see IPAL note ?4/8 MRI with no acute findings ?4/11 trial of extubation failed, she was reintubated, bedside tracheostomy was attempted but failed ?4/12 CT neck was done, negative for acute findings ?4/15-tracheostomy, performed by ENT ?4/16 trach collar ?4/18: TCT overnight and yesterday ? ?Interim History / Subjective:  ? ?Has been on TCT for at least 24 hours ?Remains on precedex; patient not following commands but moving all extremities involuntarily ? ?Objective   ?Blood pressure (!) 120/53, pulse 66, temperature 98.2 ?F  (36.8 ?C), temperature source Axillary, resp. rate 13, height '5\' 3"'$  (1.6 m), weight 84.2 kg, last menstrual period 12/17/2016, SpO2 100 %. ?   ?FiO2 (%):  [28 %] 28 %  ? ?Intake/Output Summary (Last 24 hours) at 10/24/2021 0749 ?Last data filed at 10/24/2021 0600 ?Gross per 24 hour  ?Intake 1793.79 ml  ?Output --  ?Net 1793.79 ml  ? ? ?Filed Weights  ? 10/18/21 0645 10/18/21 1046 10/22/21 0810  ?Weight: 79.9 kg 76.5 kg 84.2 kg  ? ?Physical Exam: ?General:  trach patient in NAD ?HEENT: MM pink/moist; trach in place ?Neuro: on precedex; not following commands; PERRL; moves extremities not on command ?CV: s1s2, no m/r/g ?PULM:  dim clear BS bilaterally; trach collar in place ?GI: soft, bsx4 active  ?Extremities: warm/dry, no edema  ?Skin: no rashes or lesions appreciated ? ?Resolved Hospital Problem list   ?Hyperkalemia, resolved ?Hyponatremia, resolved ? ?Assessment & Plan:  ? ?Pres syndrome with associated status epilepticus ?Acute metabolic encephalopathy secondary to pres ?-Last MRI did show resolution of findings consistent with press ?P: ?-cont to wean precedex ?-will check qtc and consider starting seroquel ?-cont vimpat and provigil per neuro for seizure ppx ?-cont gabapentin ?-frequent neurochecks; seizure precaution in place ? ?Acute respiratory failure secondary to probable aspiration pneumonia ?-S/p tracheostomy ?P: ?-continue trach collar as tolerated; has been on for >24 hours ?-prn suction ?-trach care per protocol ? ?End stage renal disease on hemodialysis ?P: ?-nephro following ?-cont iHD ?-Trend BMP / urinary output ?-Replace electrolytes as indicated ?-Avoid nephrotoxic agents, ensure adequate  renal perfusion ? ?HFrEF ?Coronary artery disease/hyperlipidemia ?P: ?-cont carvedilol, hydralazine ?-cont ASA and statin ?-cont zetia ? ?Type 2 diabetes with hyperglycemia ?P: ?-cont SSI and cbg monitoring ?-cont basal and TF coverage ? ?Hypothyroidism ?P: ?-cont synthroid ? ?Unstageable sacral ulcer ?P: ?-wound  care ? ?Best Practice (right click and "Reselect all SmartList Selections" daily)  ? ?Diet/type: tubefeeds ?DVT prophylaxis: prophylactic heparin  ?GI prophylaxis: PPI ?Lines: N/A ?Foley:  N/A ?Code Status:  full code ? ?Critical Care: 35 minutes ? ?Mikki Harbor, PA-C ?[facility] ?10/24/2021, 8:04 AM ? ?Please see [email or web address] for pager details. ? ?From 7A-7P if no response, please call [phone]. ?After hours, please call ELink [phone]. ? ? ?

## 2021-10-25 DIAGNOSIS — J9600 Acute respiratory failure, unspecified whether with hypoxia or hypercapnia: Secondary | ICD-10-CM | POA: Diagnosis not present

## 2021-10-25 DIAGNOSIS — R4182 Altered mental status, unspecified: Secondary | ICD-10-CM | POA: Diagnosis not present

## 2021-10-25 DIAGNOSIS — G9340 Encephalopathy, unspecified: Secondary | ICD-10-CM | POA: Diagnosis not present

## 2021-10-25 DIAGNOSIS — I6783 Posterior reversible encephalopathy syndrome: Secondary | ICD-10-CM | POA: Diagnosis not present

## 2021-10-25 LAB — RENAL FUNCTION PANEL
Albumin: 2.4 g/dL — ABNORMAL LOW (ref 3.5–5.0)
Anion gap: 12 (ref 5–15)
BUN: 42 mg/dL — ABNORMAL HIGH (ref 6–20)
CO2: 26 mmol/L (ref 22–32)
Calcium: 8.4 mg/dL — ABNORMAL LOW (ref 8.9–10.3)
Chloride: 96 mmol/L — ABNORMAL LOW (ref 98–111)
Creatinine, Ser: 3.43 mg/dL — ABNORMAL HIGH (ref 0.44–1.00)
GFR, Estimated: 16 mL/min — ABNORMAL LOW (ref >60)
Glucose, Bld: 124 mg/dL — ABNORMAL HIGH (ref 70–99)
Phosphorus: 4.8 mg/dL — ABNORMAL HIGH (ref 2.5–4.6)
Potassium: 4.2 mmol/L (ref 3.5–5.1)
Sodium: 134 mmol/L — ABNORMAL LOW (ref 135–145)

## 2021-10-25 LAB — CBC WITH DIFFERENTIAL/PLATELET
Abs Immature Granulocytes: 0.33 10*3/uL — ABNORMAL HIGH (ref 0.00–0.07)
Basophils Absolute: 0.1 10*3/uL (ref 0.0–0.1)
Basophils Relative: 1 %
Eosinophils Absolute: 0.6 10*3/uL — ABNORMAL HIGH (ref 0.0–0.5)
Eosinophils Relative: 6 %
HCT: 23.9 % — ABNORMAL LOW (ref 36.0–46.0)
Hemoglobin: 8 g/dL — ABNORMAL LOW (ref 12.0–15.0)
Immature Granulocytes: 3 %
Lymphocytes Relative: 15 %
Lymphs Abs: 1.5 10*3/uL (ref 0.7–4.0)
MCH: 32.8 pg (ref 26.0–34.0)
MCHC: 33.5 g/dL (ref 30.0–36.0)
MCV: 98 fL (ref 80.0–100.0)
Monocytes Absolute: 1.2 10*3/uL — ABNORMAL HIGH (ref 0.1–1.0)
Monocytes Relative: 11 %
Neutro Abs: 6.7 10*3/uL (ref 1.7–7.7)
Neutrophils Relative %: 64 %
Platelets: 282 10*3/uL (ref 150–400)
RBC: 2.44 MIL/uL — ABNORMAL LOW (ref 3.87–5.11)
RDW: 18.6 % — ABNORMAL HIGH (ref 11.5–15.5)
WBC: 10.3 10*3/uL (ref 4.0–10.5)
nRBC: 0 % (ref 0.0–0.2)

## 2021-10-25 LAB — BPAM RBC
Blood Product Expiration Date: 202305072359
ISSUE DATE / TIME: 202304181228
Unit Type and Rh: 9500

## 2021-10-25 LAB — BASIC METABOLIC PANEL
Anion gap: 12 (ref 5–15)
BUN: 37 mg/dL — ABNORMAL HIGH (ref 6–20)
CO2: 23 mmol/L (ref 22–32)
Calcium: 8 mg/dL — ABNORMAL LOW (ref 8.9–10.3)
Chloride: 96 mmol/L — ABNORMAL LOW (ref 98–111)
Creatinine, Ser: 2.98 mg/dL — ABNORMAL HIGH (ref 0.44–1.00)
GFR, Estimated: 18 mL/min — ABNORMAL LOW (ref >60)
Glucose, Bld: 124 mg/dL — ABNORMAL HIGH (ref 70–99)
Potassium: 4 mmol/L (ref 3.5–5.1)
Sodium: 131 mmol/L — ABNORMAL LOW (ref 135–145)

## 2021-10-25 LAB — GLUCOSE, CAPILLARY
Glucose-Capillary: 117 mg/dL — ABNORMAL HIGH (ref 70–99)
Glucose-Capillary: 136 mg/dL — ABNORMAL HIGH (ref 70–99)
Glucose-Capillary: 147 mg/dL — ABNORMAL HIGH (ref 70–99)
Glucose-Capillary: 150 mg/dL — ABNORMAL HIGH (ref 70–99)
Glucose-Capillary: 173 mg/dL — ABNORMAL HIGH (ref 70–99)
Glucose-Capillary: 239 mg/dL — ABNORMAL HIGH (ref 70–99)

## 2021-10-25 LAB — TYPE AND SCREEN
ABO/RH(D): O NEG
Antibody Screen: NEGATIVE
Unit division: 0

## 2021-10-25 MED ORDER — CARVEDILOL 6.25 MG PO TABS
6.2500 mg | ORAL_TABLET | Freq: Two times a day (BID) | ORAL | Status: DC
  Administered 2021-10-26 – 2021-11-01 (×8): 6.25 mg
  Filled 2021-10-25 (×11): qty 1

## 2021-10-25 MED ORDER — CLONIDINE HCL 0.1 MG PO TABS
0.1000 mg | ORAL_TABLET | Freq: Every day | ORAL | Status: AC
  Administered 2021-10-27: 0.1 mg
  Filled 2021-10-25: qty 1

## 2021-10-25 MED ORDER — CLONIDINE HCL 0.1 MG PO TABS
0.1000 mg | ORAL_TABLET | Freq: Two times a day (BID) | ORAL | Status: AC
  Administered 2021-10-26 – 2021-10-27 (×2): 0.1 mg
  Filled 2021-10-25 (×2): qty 1

## 2021-10-25 MED ORDER — CLONIDINE HCL 0.1 MG PO TABS: 0.1000 mg | ORAL_TABLET | Freq: Two times a day (BID) | ORAL | Status: DC

## 2021-10-25 MED ORDER — HYDRALAZINE HCL 50 MG PO TABS
50.0000 mg | ORAL_TABLET | Freq: Two times a day (BID) | ORAL | Status: DC
  Administered 2021-10-25 – 2021-10-31 (×12): 50 mg
  Filled 2021-10-25 (×13): qty 1

## 2021-10-25 MED ORDER — CLONIDINE HCL 0.1 MG PO TABS
0.1000 mg | ORAL_TABLET | Freq: Four times a day (QID) | ORAL | Status: AC
  Administered 2021-10-25 – 2021-10-26 (×3): 0.1 mg
  Filled 2021-10-25 (×4): qty 1

## 2021-10-25 MED ORDER — CLONIDINE HCL 0.1 MG PO TABS: 0.1000 mg | ORAL_TABLET | Freq: Every day | ORAL | Status: DC

## 2021-10-25 MED ORDER — CLONIDINE HCL 0.1 MG PO TABS: 0.1000 mg | ORAL_TABLET | Freq: Four times a day (QID) | ORAL | Status: DC

## 2021-10-25 MED ORDER — HYDRALAZINE HCL 50 MG PO TABS
75.0000 mg | ORAL_TABLET | Freq: Two times a day (BID) | ORAL | Status: DC
  Filled 2021-10-25: qty 1

## 2021-10-25 NOTE — Progress Notes (Signed)
Subjective: No acute events overnight ? ?ROS: Unable to obtain due to poor mental status ? ?Examination ? ?Vital signs in last 24 hours: ?Temp:  [98.3 ?F (36.8 ?C)-101.6 ?F (38.7 ?C)] 99.5 ?F (37.5 ?C) (04/19 1152) ?Pulse Rate:  [73-92] 81 (04/19 1127) ?Resp:  [15-25] 16 (04/19 1127) ?BP: (93-151)/(40-66) 116/53 (04/19 1100) ?SpO2:  [93 %-100 %] 98 % (04/19 1127) ?FiO2 (%):  [28 %] 28 % (04/19 1127) ?Weight:  [82.1 kg] 82.1 kg (04/18 1700) ? ?General: lying in bed, NAD ?Neuro: winces to noxious stimulation but doesn't open eyes or follow commands, pupils sluggishly reacting to light, no forced gaze deviation, withdraws to noxious stimuli in bilateral upper extremities with antigravity strength, withdraws to noxious stimuli in bilateral lower extremities ? ?Basic Metabolic Panel: ?Recent Labs  ?Lab 10/19/21 ?3875 10/20/21 ?0727 10/09/2021 ?2036 10/22/21 ?6433 10/24/21 ?0251 10/25/21 ?0106 10/25/21 ?0719  ?NA 135   < > 133* 132* 130* 131* 134*  ?K 4.0   < > 4.9 4.8 5.1 4.0 4.2  ?CL 97*   < > 98 97* 94* 96* 96*  ?CO2 21*   < > 20* 19* 19* 23 26  ?GLUCOSE 457*   < > 320* 141* 134* 124* 124*  ?BUN 65*   < > 73* 79* 75* 37* 42*  ?CREATININE 4.37*   < > 4.55* 4.83* 4.79* 2.98* 3.43*  ?CALCIUM 9.0   < > 8.1* 8.1* 8.2* 8.0* 8.4*  ?MG 2.2  --   --  2.2  --   --   --   ?PHOS 6.9*  --   --   --   --   --  4.8*  ? < > = values in this interval not displayed.  ? ? ?CBC: ?Recent Labs  ?Lab 10/20/21 ?2951 10/24/2021 ?8841 10/22/21 ?6606 10/24/21 ?3016 10/25/21 ?0106  ?WBC 11.2* 9.5 9.3 10.3 10.3  ?NEUTROABS  --   --   --  6.2 6.7  ?HGB 8.1* 7.8* 7.9* 7.0* 8.0*  ?HCT 23.9* 24.4* 23.6* 21.1* 23.9*  ?MCV 100.0 100.4* 99.2 102.4* 98.0  ?PLT 335 323 312 280 282  ? ? ? ?Coagulation Studies: ?No results for input(s): LABPROT, INR in the last 72 hours. ? ?Imaging ?No new brain imaging ?  ?ASSESSMENT AND PLAN: 51 year old female with seizures and altered mental status in the setting of systolic blood pressure 010 which has since improved. ?   ?Seizure ?Posterior reversible encephalopathy syndrome (PRES), resolved ?Persistent encephalopathy ?-No clear cause of encephalopathy.  No evidence of infection, no toxic -metabolic causes, seizures have resolved, has been off sedation for more than 2 weeks, MRI brain has been improving.  It is possible patient had some hypoxic or hypoglycemic injury ?  ?Recommendations ?-Vimpat was started due to suspected seizures on arrival which were likely provoked due to PRES. EEG showed generalized periodic discharges, but no definite seizures or epileptiform discharges were noted.  Patient has not had any further seizures. Therefore, would recommend continuing Vimpat till end of June and if patient remains seizure-free, this can most likely be stopped. ?no frank seizures were noted. ?-Also on gabapentin and Seroquel which is mostly for agitation.  This can gradually be tapered off if agitation improves ?-Continue seizure precautions ?-As needed IV Ativan 2 mg for clinical seizure-like activity ?-Management of rest of comorbidities per primary team ?-Recommend follow-up with neurology in 3 months after discharge ?  ?I have spent a total of 37 minutes with the patient reviewing hospital notes,  test results, labs  and examining the patient as well as establishing an assessment and plan. > 50% of time was spent in direct patient care. ? ? ?Zeb Comfort ?Epilepsy ?[facility] ?For questions after 5pm please refer to AMION to reach the Neurologist on call ? ?

## 2021-10-25 NOTE — Progress Notes (Addendum)
? ?NAME:  Melanie Hall, MRN:  [id number], DOB:  25-Jan-1971, LOS: 21 ?ADMISSION DATE:  09/21/2021, CONSULTATION DATE:  09/17/2021 ?REFERRING MD:  Dr. Roderic Palau, ER, CHIEF COMPLAINT:  AMS  ? ?History of Present Illness:  ?51 yo female former smoker with hx of ESRD on HD was found at her home by patient transport driver unresponsive.  Pt's neighbor noted that she was tapping her hands for about a minute, and then stopped.  She has snoring and drooling.  No improvement after narcan.  CBG normal.  Noted to have BP 215/96.  Intubated in [facility] for airway protection, given keppra load, started on diprivan, and started on antibiotics.  CT head negative for acute findings.  Treated for hyperkalemia.  Pt's daughter report she had similar episode on 09/24/21 and EMS was called to the house, but episode resolve and patient declined hospital evaluation.  No prior history of seizures or known head injury.  ER team d/w case with neurology and advised to transfer to [facility] for EEG monitoring and further neurology assessment. ? ?Pertinent  Medical History  ?CHF, Anemia, OA, CAD, DM type 2, ESRD on HD, HLD, HTN, CVA ? ?Significant Hospital Events: ?Including procedures, antibiotic start and stop dates in addition to other pertinent events   ?3/29 present to [facility], intubated, transfer to [facility] ?3/30 MRI Brain consistent with PRES ?4/3 off sedation, not waking up ?4/4 transitioned off insulin gtt to SSI ?4/5 [place] conversation; see IPAL note ?4/8 MRI with no acute findings ?4/11 trial of extubation failed, she was reintubated, bedside tracheostomy was attempted but failed ?4/12 CT neck was done, negative for acute findings ?4/15-tracheostomy, performed by ENT ?4/16 trach collar ?4/18: TCT overnight and yesterday; added seroquel and clonidine taper; weaned off precedex at 5pm ?4/19: remains on TCT >48 hours ? ?Interim History / Subjective:  ? ?Has been on TCT for at least 48 hours ?Seroquel and clonidine taper started yesterday; precedex stopped  yesterday at 5pm ?Neuro status unchanged ? ?Objective   ?Blood pressure (!) 101/48, pulse 78, temperature 99.6 ?F (37.6 ?C), temperature source Axillary, resp. rate (!) 23, height '5\' 3"'$  (1.6 m), weight 82.1 kg, last menstrual period 12/17/2016, SpO2 100 %. ?   ?FiO2 (%):  [28 %] 28 %  ? ?Intake/Output Summary (Last 24 hours) at 10/25/2021 0737 ?Last data filed at 10/25/2021 0600 ?Gross per 24 hour  ?Intake 2109.72 ml  ?Output 2800 ml  ?Net -690.28 ml  ? ? ?Filed Weights  ? 10/18/21 1046 10/22/21 0810 10/24/21 1700  ?Weight: 76.5 kg 84.2 kg 82.1 kg  ? ?Physical Exam: ?General:  trach patient in NAD ?HEENT: MM pink/moist; trach in place ?Neuro: not following commands; PERRL; moves extremities not on command ?CV: s1s2, no m/r/g ?PULM:  dim clear BS bilaterally; trach collar in place ?GI: soft, bsx4 active  ?Extremities: warm/dry, no edema  ?Skin: no rashes or lesions appreciated ? ?Resolved Hospital Problem list   ?Hyperkalemia, resolved ?Hyponatremia, resolved ? ?Assessment & Plan:  ? ?Pres syndrome with associated status epilepticus ?Acute metabolic encephalopathy secondary to pres ?-Last MRI did show resolution of findings consistent with press ?P: ?-precedex off 4/18 ?-seroquel and clonidine taper started 4/18 ?-will check qtc and consider increasing seroquel dose ?-cont vimpat and provigil per neuro for seizure ppx ?-cont gabapentin ?-frequent neurochecks; seizure precaution in place ? ?Acute respiratory failure secondary to probable aspiration pneumonia ?-S/p tracheostomy ?P: ?-continue trach collar as tolerated; has been on for >48 hours ?-prn tracheal suction ?-trach care per protocol ? ?  End stage renal disease on hemodialysis ?P: ?-nephro following ?-cont iHD ?-Trend BMP / urinary output ?-Replace electrolytes as indicated ?-Avoid nephrotoxic agents, ensure adequate renal perfusion ? ?HFrEF ?Coronary artery disease/hyperlipidemia ?P: ?-BP soft overnight since starting clonidine taper ?-will decrease dose of  hydralazine ?-cont carvedilol and clonidine ?-cont ASA and statin ?-cont zetia ? ?Type 2 diabetes with hyperglycemia ?P: ?-cont SSI and cbg monitoring ?-cont basal and TF coverage ? ?Hypothyroidism ?P: ?-cont synthroid ? ?Unstageable sacral ulcer ?P: ?-wound care ? ?Best Practice (right click and "Reselect all SmartList Selections" daily)  ? ?Diet/type: tubefeeds ?DVT prophylaxis: prophylactic heparin  ?GI prophylaxis: PPI ?Lines: N/A ?Foley:  N/A ?Code Status:  full code ?GOC: 4/19 called daughter [name] over phone and updated ? ? ? ?Mikki Harbor, PA-C ?[facility] ?10/25/2021, 7:37 AM ? ?Please see [email or web address] for pager details. ? ?From 7A-7P if no response, please call [phone]. ?After hours, please call ELink [phone]. ? ? ?

## 2021-10-25 NOTE — Progress Notes (Signed)
?[facility] ?Progress Note  ? ?Assessment/ Plan:   ?OP HD: TTS [facility] ?  3h 40mn  75.5kg  2/2.5 bath  400 bfr   Hep none LUE AVF ? - 4/14 > hep B Ag negative and hep B Ab's low/ not protective ? - mircera 200 q2 wks ? - venofer 50 weekly ? - rocaltrol 0.25 tiw ?  ?CXR 4/14 - no active disease ?  ?Assessment/ Plan:  ?AMS - found unresponsive at home. Had PRES and seizures, seen by neurology. MS remains poor. MRI has been improving. Sz meds lowered and getting provigil to promote wakefulness. Per neuro/ pmd.  ?Resp failure - intubated for airway protection.  She wasn't able to be extubated and is now SP trach 4/15 by ENT. Remains on vent, per CCM.  ?ESRD - on TTS [facility]. Next HD tomorrow.  ?HTN/ Volume - Wt's up, hoyer wt showed 82kg. BP's soft, will lower coreg/ hydralazine and hold if SBP <120.  ?DM2 - per pmd ?Possible sepsis - sp short course IV abx. BCx x2 NGTD ?Anemia - Hb 8-9s. Was on esa/ IV fe at OP unit. Cont darbe 150 ug q Friday and weekly IV Fe here.  ?CKD-BMM: CCa stable. Cont vdra tiw.  ?CAD/ HL - on BB, asa, statin; per pmd.  ?Nutrition: getting TF"s at 50 cc/hr ?Dispo - placement for a patient on dialysis with a trach is exceedingly difficult as everyone is aware. ? ?RKelly Splinter MD ?10/25/2021, 1:49 PM ? ? ? ? ? ?Subjective:   ?Seen in ICU. Trach in place, not responding.   ? ?Objective:   ?BP (!) 116/53   Pulse 81   Temp 99.5 ?F (37.5 ?C) (Axillary)   Resp 16   Ht '5\' 3"'  (1.6 m)   Wt 82.1 kg   LMP 12/17/2016   SpO2 98%   BMI 32.06 kg/m?  ? ?Physical Exam: ?GPOE:UMPNTIRWE?CVS: RRR ?Resp: clear anteriorly ?Abd: soft ?Ext: no LE or UE edema noted ?ACCESS: + T/B LUE AVF ? ?Recent Labs  ?Lab 10/19/21 ?0315404/14/23 ?0008604/18/23 ?0251 10/25/21 ?0106 10/25/21 ?0719  ?HGB 8.9*   < > 7.0* 8.0*  --   ?ALBUMIN 2.4*  --   --   --  2.4*  ?CALCIUM 9.0   < > 8.2* 8.0* 8.4*  ?PHOS 6.9*  --   --   --  4.8*  ?CREATININE 4.37*   < > 4.79* 2.98* 3.43*  ?K 4.0   < > 5.1  4.0 4.2  ? < > = values in this interval not displayed.  ? ? ? ?  ?Medications:   ? ? aspirin  81 mg Per Tube Daily  ? atorvastatin  80 mg Per Tube q1800  ? calcitRIOL  0.25 mcg Per Tube Q T,Th,Sat-1800  ? carvedilol  6.25 mg Per Tube BID WC  ? chlorhexidine gluconate (MEDLINE KIT)  15 mL Mouth Rinse BID  ? Chlorhexidine Gluconate Cloth  6 each Topical Q0600  ? cholestyramine  4 g Per Tube TID  ? cloNIDine  0.1 mg Per Tube Q6H  ? Followed by  ? [START ON 10/26/2021] cloNIDine  0.1 mg Per Tube Q12H  ? Followed by  ? [START ON 10/27/2021] cloNIDine  0.1 mg Per Tube Daily  ? cloNIDine  0.2 mg Per Tube Q6H  ? darbepoetin (ARANESP) injection - DIALYSIS  150 mcg Intravenous Q Thu-HD  ? ezetimibe  10 mg Per Tube Daily  ? feeding supplement (PROSource TF)  45  mL Per Tube TID  ? fiber  1 packet Per Tube TID  ? folic acid  1 mg Per Tube Daily  ? gabapentin  300 mg Per Tube Q8H  ? heparin injection (subcutaneous)  5,000 Units Subcutaneous Q8H  ? hydrALAZINE  75 mg Per Tube BID  ? insulin aspart  0-15 Units Subcutaneous Q4H  ? insulin aspart  5 Units Subcutaneous Q4H  ? insulin detemir  15 Units Subcutaneous BID  ? lacosamide  50 mg Per Tube BID  ? leptospermum manuka honey  1 application. Topical UD  ? levothyroxine  50 mcg Per Tube QAC breakfast  ? mouth rinse  15 mL Mouth Rinse 10 times per day  ? multivitamin  1 tablet Per Tube QHS  ? pantoprazole sodium  40 mg Per Tube Daily  ? QUEtiapine  50 mg Per Tube BID  ? thiamine  100 mg Per Tube Daily  ? vitamin B-12  1,000 mcg Per Tube Daily  ? ? ? ? ?

## 2021-10-26 DIAGNOSIS — J9601 Acute respiratory failure with hypoxia: Secondary | ICD-10-CM | POA: Diagnosis not present

## 2021-10-26 DIAGNOSIS — I6783 Posterior reversible encephalopathy syndrome: Secondary | ICD-10-CM | POA: Diagnosis not present

## 2021-10-26 DIAGNOSIS — R4182 Altered mental status, unspecified: Secondary | ICD-10-CM | POA: Diagnosis not present

## 2021-10-26 DIAGNOSIS — N186 End stage renal disease: Secondary | ICD-10-CM | POA: Diagnosis not present

## 2021-10-26 DIAGNOSIS — G9340 Encephalopathy, unspecified: Secondary | ICD-10-CM | POA: Diagnosis not present

## 2021-10-26 DIAGNOSIS — Z930 Tracheostomy status: Secondary | ICD-10-CM | POA: Diagnosis not present

## 2021-10-26 LAB — BASIC METABOLIC PANEL
Anion gap: 13 (ref 5–15)
BUN: 69 mg/dL — ABNORMAL HIGH (ref 6–20)
CO2: 22 mmol/L (ref 22–32)
Calcium: 8.4 mg/dL — ABNORMAL LOW (ref 8.9–10.3)
Chloride: 95 mmol/L — ABNORMAL LOW (ref 98–111)
Creatinine, Ser: 4.9 mg/dL — ABNORMAL HIGH (ref 0.44–1.00)
GFR, Estimated: 10 mL/min — ABNORMAL LOW (ref >60)
Glucose, Bld: 192 mg/dL — ABNORMAL HIGH (ref 70–99)
Potassium: 3.8 mmol/L (ref 3.5–5.1)
Sodium: 130 mmol/L — ABNORMAL LOW (ref 135–145)

## 2021-10-26 LAB — CBC
HCT: 23.8 % — ABNORMAL LOW (ref 36.0–46.0)
Hemoglobin: 7.6 g/dL — ABNORMAL LOW (ref 12.0–15.0)
MCH: 32.2 pg (ref 26.0–34.0)
MCHC: 31.9 g/dL (ref 30.0–36.0)
MCV: 100.8 fL — ABNORMAL HIGH (ref 80.0–100.0)
Platelets: 252 10*3/uL (ref 150–400)
RBC: 2.36 MIL/uL — ABNORMAL LOW (ref 3.87–5.11)
RDW: 18.6 % — ABNORMAL HIGH (ref 11.5–15.5)
WBC: 11.7 10*3/uL — ABNORMAL HIGH (ref 4.0–10.5)
nRBC: 0 % (ref 0.0–0.2)

## 2021-10-26 LAB — GLUCOSE, CAPILLARY
Glucose-Capillary: 134 mg/dL — ABNORMAL HIGH (ref 70–99)
Glucose-Capillary: 137 mg/dL — ABNORMAL HIGH (ref 70–99)
Glucose-Capillary: 148 mg/dL — ABNORMAL HIGH (ref 70–99)
Glucose-Capillary: 168 mg/dL — ABNORMAL HIGH (ref 70–99)
Glucose-Capillary: 200 mg/dL — ABNORMAL HIGH (ref 70–99)

## 2021-10-26 NOTE — Progress Notes (Signed)
Patient seen today by trach team for consult.  No education is needed at this time.  All necessary equipment is at beside.   Will continue to follow for progression.  

## 2021-10-26 NOTE — Progress Notes (Signed)
?[facility] ?Progress Note  ? ?Subjective:  Seen in room, and later at onset of dialysis - 2L UFG and tolerating so far. She remains non-responsive, dried blood around her trach.   ? ?Objective ?Vitals:  ? 10/26/21 0323 10/26/21 0725 10/26/21 0817 10/26/21 1017  ?BP: 111/61 (!) 99/54 126/60 106/83  ?Pulse: 73 90 94   ?Resp: _0 ?Temp: 99.1 ?F (37.3 ?C) 99.4 ?F (37.4 ?C)    ?TempSrc: Axillary Axillary    ?SpO2: 100% 98%  94%  ?Weight:    90 kg  ?Height:      ? ?Physical Exam ?General: Ill appearing woman, non-responsive to questions, O2 to trach. Dried blood surrounding the trach. NG tube in place. ?Heart: RRR; no murmur ?Lungs: CTA anteriorly ?Abdomen: soft ?Extremities: No LE edema ?Dialysis Access: LUE AVF + thrill (cannulated) ? ?Additional Objective ?Labs: ?Basic Metabolic Panel: ?Recent Labs  ?Lab 10/25/21 ?0106 10/25/21 ?0719 10/26/21 ?0159  ?NA 131* 134* 130*  ?K 4.0 4.2 3.8  ?CL 96* 96* 95*  ?CO2 _1 ?GLUCOSE 124* 124* 192*  ?BUN 37* 42* 69*  ?CREATININE 2.98* 3.43* 4.90*  ?CALCIUM 8.0* 8.4* 8.4*  ?PHOS  --  4.8*  --   ? ?Liver Function Tests: ?Recent Labs  ?Lab 10/25/21 ?0719  ?ALBUMIN 2.4*  ? ?CBC: ?Recent Labs  ?Lab 10/25/2021 ?0108 10/22/21 ?0208 10/24/21 ?0251 10/25/21 ?0106 10/26/21 ?0159  ?WBC 9.5 9.3 10.3 10.3 11.7*  ?NEUTROABS  --   --  6.2 6.7  --   ?HGB 7.8* 7.9* 7.0* 8.0* 7.6*  ?HCT 24.4* 23.6* 21.1* 23.9* 23.8*  ?MCV 100.4* 99.2 102.4* 98.0 100.8*  ?PLT 323 312 280 282 252  ? ?Medications: ? sodium chloride Stopped (10/20/2021 2020)  ? sodium chloride    ? feeding supplement (NEPRO CARB STEADY) 1,000 mL (10/25/21 1607)  ? ferric gluconate (FERRLECIT) IVPB 62.5 mg (10/20/21 1121)  ? ? aspirin  81 mg Per Tube Daily  ? atorvastatin  80 mg Per Tube q1800  ? calcitRIOL  0.25 mcg Per Tube Q T,Th,Sat-1800  ? carvedilol  6.25 mg Per Tube BID WC  ? chlorhexidine gluconate (MEDLINE KIT)  15 mL Mouth Rinse BID  ? Chlorhexidine Gluconate Cloth  6 each Topical Q0600  ?  cholestyramine  4 g Per Tube TID  ? cloNIDine  0.1 mg Per Tube Q6H  ? Followed by  ? cloNIDine  0.1 mg Per Tube Q12H  ? Followed by  ? [START ON 10/27/2021] cloNIDine  0.1 mg Per Tube Daily  ? darbepoetin (ARANESP) injection - DIALYSIS  150 mcg Intravenous Q Thu-HD  ? ezetimibe  10 mg Per Tube Daily  ? feeding supplement (PROSource TF)  45 mL Per Tube TID  ? fiber  1 packet Per Tube TID  ? folic acid  1 mg Per Tube Daily  ? gabapentin  300 mg Per Tube Q8H  ? heparin injection (subcutaneous)  5,000 Units Subcutaneous Q8H  ? hydrALAZINE  50 mg Per Tube BID  ? insulin aspart  0-15 Units Subcutaneous Q4H  ? insulin aspart  5 Units Subcutaneous Q4H  ? insulin detemir  15 Units Subcutaneous BID  ? lacosamide  50 mg Per Tube BID  ? leptospermum manuka honey  1 application. Topical UD  ? levothyroxine  50 mcg Per Tube QAC breakfast  ? mouth rinse  15 mL Mouth Rinse 10 times per day  ? multivitamin  1 tablet Per Tube QHS  ? pantoprazole sodium  40 mg Per Tube Daily  ? QUEtiapine  50 mg Per Tube BID  ? thiamine  100 mg Per Tube Daily  ? vitamin B-12  1,000 mcg Per Tube Daily  ? ? ?Dialysis Orders: ? TTS [facility] ?  3h 45mn  75.5kg  2/2.5 bath  400 bfr   Hep none LUE AVF ? - 4/14 > hep B Ag negative and hep B Ab's low/ not protective ? - mircera 200 q2 wks ? - venofer 50 weekly ? - rocaltrol 0.25 tiw ?  ?Assessment/Plan: ?AMS: Found unresponsive at home. Had PRES and seizures, seen by neurology. MS remains poor. MRI has been improving. Sz meds lowered and getting provigil to promote wakefulness. Per neuro/ pmd.  ?Resp failure: Initially intubated for airway protection, s/p trach 4/15 by ENT. Now off vent, with trach collar O2. ?ESRD: Usual TTS schedule - HD today. ?HTN/ Volume: BP soft, carvedilol reduced 278mBID -> 6.2560mID, hydralazine reduced 100m54mD -> 50mg64m. Clonidine 0.1 given this AM, now has been d/c'd. Hoyer wt = 82kg. UF as tolerated. ?T2DM: Insulin per primary. ?Possible sepsis: S/p short course IV  abx. Blood Cx 3/29 negative. ?Anemia of ESRD: Hgb 7.6. Continue Aranesp 150mcg30mhusday and weekly IV Fe here.  ?Secondary hyperparathyroidism: CorrCa/Phos ok. Continue VDRA, no binder for now. ?CAD/ HL: On BB, asa, statin; per pmd.  ?Nutrition: Nepro TF + supplements. ?Unstageable sacral ulcer: Wound care following. ?Dispo: Placement for a patient on dialysis with a trach is exceedingly difficult as everyone is aware. ? ?Katie Veneta Penton ?10/26/2021, 10:30 AM  ?[facility] ? ? ? ?

## 2021-10-26 NOTE — Plan of Care (Signed)

## 2021-10-26 NOTE — Hospital Course (Addendum)
The patient is a 52 year old former smoker with past medical history significant for but not limited to history of ESRD on hemodialysis, CHF, anemia, osteoarthritis, history of CAD, history of diabetes mellitus type 2, hypertension, hyperlipidemia, history of CVA as well as other comorbidities who was found at her home by her patient transport driver to be unresponsive.  Patient's neighbor noted that she was tapping her hands for about a minute and then stop.  She was snoring and drooling and had no improvement after Narcan was given.  She is noted to have a normal CBG and blood pressure was 215/96 on admission.  She was intubated in a[facility]m. pain hospital for airway protection, given a Keppra load and started on dipper Van and started on antibiotics.  Head CT was negative for any acute findings and she was treated for hyperkalemia.  She did similar episode on 09/24/2021 when EMS was called to the house but the episode resolved and patient declined hospital evaluation.  She had no prior history of seizures or known head injury and the ED team discussed with neurology and they advised the patient to be transferred to [facility] for EEG monitoring and further neurology assessment. ? ?Current significant hospital events are as below:  ?Significant Hospital Events: ?Including procedures, antibiotic start and stop dates in addition to other pertinent events   ?3/29 present to [facility], intubated, transfer to [facility] ?3/30 MRI Brain consistent with PRES ?4/3 off sedation, not waking up ?4/4 transitioned off insulin gtt to SSI ?4/5 [place] conversation; see IPAL note ?4/8 MRI with no acute findings ?4/11 trial of extubation failed, she was reintubated, bedside tracheostomy was attempted but failed ?4/12 CT neck was done, negative for acute findings ?4/15-tracheostomy, performed by ENT ?4/16 trach collar ?4/18: TCT overnight and yesterday; added seroquel and clonidine taper; weaned off precedex at 5pm ?4/19: remains on TCT >48 hours  ?4/20:  Transferred to [place] and underwent Dialysis again.  ?4/21: Has not changed with her mental status and had some bloody secretions from her trachea.  Palliative care has been consulted given that she is unresponsive.  Continues to be on 5 L and on Cortrak.  We will need to discuss about PEG tube options of SNF placement however there are several barriers given patient's insurance and no dialysis centers that take trachs ?4/22: Palliative Care Discussions being held and patient made DNR and she underwent Dialysis again; PEG tube discussions will be held further if no improvement in speech therapy but patient daughter will allow more time to see if the patient makes any improvement ?4/23- Blood count dropped to 6.7 so will be transfused 1 unit of pRBCs. Palliative continues conversations and Daughter would pursue PEG. Continues to have Fevers so will pan-scan but neurology suspects that she may have some hypoxic injury is not showing up on MRI and feels that this could be indicative of her low intermittent fevers ?4/24 -still not as responsive but she remains off of Precedex.  Seroquel and clonidine are being tapered.  Neurology is following and she continues to spike intermittent fevers but neurology feels that it could be from DVTs so we will order bilateral upper extremity and lower extremity duplexes to evaluate.  Per pulmonary is okay to remove sutures this morning and change gauze dressing.  The plan was to have a conference call with the patient daughter but daughter did not pick up the phone as she did not yesterday.  Nephrology is following and plan is for next HD session  on 10/31/2021 neurology recommending continuing Vimpat and tapering off after June.  Neurology feels that she does not have any purposeful movements ?4/25-hemodialysis today and she was significantly agitated so she had to be given Ativan.  Still not awake and continues to have nonpurposeful movements; we will obtain a KUB to ensure  proper Cortrak placement given that her tube was at ~58 cm marking and it was documented at 66 cm. Upper and LE Venous Duplex to be done today. ?

## 2021-10-26 NOTE — Progress Notes (Signed)
Prior to admission pt received out-pt HD at [facility] on TTS. Pt's out-pt clinic will not be able to meet pt's needs at d/c due to trach. Case discussed with CSW this am. Will assist as needed.  ? ?Melven Sartorius ?Renal Navigator ?[phone] ?

## 2021-10-26 NOTE — Progress Notes (Signed)
Pt found on 5L/28% Trach collar. Trach and gauze are sutured in. Unable to change gauze. Inner cannula changed. Hemoptysis noted. No resp distress at this time. Will continue to monitor.  ?

## 2021-10-26 NOTE — Progress Notes (Signed)
?PROGRESS NOTE ? ? ? ?Melanie Hall  OAC:[id number] DOB: 1970/10/06 DOA: 09/09/2021 ?PCP: Renee Rival, FNP  ? ?Brief Narrative:  ?The patient is a 51 year old former smoker with past medical history significant for but not limited to history of ESRD on hemodialysis, CHF, anemia, osteoarthritis, history of CAD, history of diabetes mellitus type 2, hypertension, hyperlipidemia, history of CVA as well as other comorbidities who was found at her home by her patient transport driver to be unresponsive.  Patient's neighbor noted that she was tapping her hands for about a minute and then stop.  She was snoring and drooling and had no improvement after Narcan was given.  She is noted to have a normal CBG and blood pressure was 215/96 on admission.  She was intubated in a.m. pain hospital for airway protection, given a Keppra load and started on dipper Van and started on antibiotics.  Head CT was negative for any acute findings and she was treated for hyperkalemia.  She did similar episode on 09/24/2021 when EMS was called to the house but the episode resolved and patient declined hospital evaluation.  She had no prior history of seizures or known head injury and the ED team discussed with neurology and they advised the patient to be transferred to [facility] for EEG monitoring and further neurology assessment. ? ?Current significant hospital events are as below:  ?Significant Hospital Events: ?Including procedures, antibiotic start and stop dates in addition to other pertinent events   ?3/29 present to [facility], intubated, transfer to [facility] ?3/30 MRI Brain consistent with PRES ?4/3 off sedation, not waking up ?4/4 transitioned off insulin gtt to SSI ?4/5 [place] conversation; see IPAL note ?4/8 MRI with no acute findings ?4/11 trial of extubation failed, she was reintubated, bedside tracheostomy was attempted but failed ?4/12 CT neck was done, negative for acute findings ?4/15-tracheostomy, performed by ENT ?4/16 trach  collar ?4/18: TCT overnight and yesterday; added seroquel and clonidine taper; weaned off precedex at 5pm ?4/19: remains on TCT >48 hours  ?4/20: Transferred to [place] and underwent Dialysis again.  ? ? ?Assessment and Plan: ?No notes have been filed under this hospital service. ?Service: Hospitalist ? ?  ?Pres syndrome with associated status epilepticus ?Acute metabolic encephalopathy secondary to PRES ?-Last MRI did show resolution of findings consistent with press ?-precedex off 4/18 ?-seroquel and clonidine taper started 4/18 ?-cont vimpat and provigil per neuro for seizure ppx ?-cont gabapentin and seroquel  ?-C/w IV Ativan 2 mg for clinical Seziure-like activity ?-frequent neurochecks; seizure precaution in place ?-Continues to be significantly encephalopathic and was not responsive to examination ?-Allergies following and she was getting Provigil to promote wakefulness ?  ?Acute respiratory failure secondary to probable aspiration pneumonia S/p tracheostomy because of Prolonged Vent Dependence due to Encephalopathy ?-Continue trach collar as tolerated; has been on for >48 hours ?-prn tracheal suction ?-trach care per protocol routine trach care; needs gauze changed and this will be done today ?-Recommending change to cuffless trach per ENT recommendations ?-SpO2: 100 % ?O2 Flow Rate (L/min): 5 L/min ?FiO2 (%): 28 % ?-Continue supplemental oxygen via ATC to maintain O2 saturations greater than 90% ?-Continue supportive care ? ?End stage renal disease on hemodialysis Tuesday Thursday Saturday ?Hyponatremia ?-nephro following ?-cont iHD ?-Trend BMP / urinary output ?-Replace electrolytes as indicated ?-Patient's BUNs/creatinine is now 69/4.90 and her sodium is now 130 ?-Avoid nephrotoxic agents, contrast dyes, ensure adequate renal perfusion to prevent dehydration and hypoperfusion and renally dose medications ?-Repeat CMP in a.m. ? ?  Anemia of chronic disease ?-Patient's hemoglobin/hematocrit is now  7.6/23.8 with an MCV of 100.8 ?-Check anemia panel in a.m. ?-Continue monitor for signs and symptoms of bleeding; no overt bleeding noted but she did have some blood on trach ?-Repeat CBC in a.m. ? ?HFrEF ?Coronary artery disease/hyperlipidemia ?-BP soft overnight since starting clonidine taper ?-will decrease dose of hydralazine ?-cont carvedilol and clonidine ?-cont ASA and statin ?-cont zetia ? ?Leukocytosis ?-WBC went from 10.3 -> 11.7 ?-Continue to monitor for signs and symptoms of infection ?-Repeat CBC in the AM  ? ?Type 2 diabetes with hyperglycemia ?-cont SSI and cbg monitoring ?-cont basal and TF coverage ?-CBGs ranging from 134-239 ? ?Hypothyroidism ?-cont synthroid ? ?Unstageable sacral ulcer ?-wound care ?Pressure Injury 09/15/2021 Toe (Comment  which one) Anterior;Right Deep Tissue Pressure Injury - Purple or maroon localized area of discolored intact skin or blood-filled blister due to damage of underlying soft tissue from pressure and/or shear. (Active)  ?09/24/2021 1551  ?Location: Toe (Comment  which one)  ?Location Orientation: Anterior;Right  ?Staging: Deep Tissue Pressure Injury - Purple or maroon localized area of discolored intact skin or blood-filled blister due to damage of underlying soft tissue from pressure and/or shear.  ?Wound Description (Comments):   ?Present on Admission: Yes  ?   ?Pressure Injury 09/06/2021 Sacrum Medial Stage 1 -  Intact skin with non-blanchable redness of a localized area usually over a bony prominence. (Active)  ?09/10/2021 1553  ?Location: Sacrum  ?Location Orientation: Medial  ?Staging: Stage 1 -  Intact skin with non-blanchable redness of a localized area usually over a bony prominence.  ?Wound Description (Comments):   ?Present on Admission: Yes  ?   ?Pressure Injury Posterior;Right Deep Tissue Pressure Injury - Purple or maroon localized area of discolored intact skin or blood-filled blister due to damage of underlying soft tissue from pressure and/or shear. purple  (Active)  ?   ?Location:   ?Location Orientation: Posterior;Right  ?Staging: Deep Tissue Pressure Injury - Purple or maroon localized area of discolored intact skin or blood-filled blister due to damage of underlying soft tissue from pressure and/or shear.  ?Wound Description (Comments): purple  ?Present on Admission:   ?   ?Pressure Injury 10/16/21 Anus Upper Stage 3 -  Full thickness tissue loss. Subcutaneous fat may be visible but bone, tendon or muscle are NOT exposed. brown slough (Active)  ?10/16/21 1802  ?Location: Anus  ?Location Orientation: Upper  ?Staging: Stage 3 -  Full thickness tissue loss. Subcutaneous fat may be visible but bone, tendon or muscle are NOT exposed.  ?Wound Description (Comments): brown slough  ?Present on Admission: No  ? ?Obesity ?-Complicates overall prognosis and care ?-Estimated body mass index is 34.48 kg/m? as calculated from the following: ?  Height as of this encounter: '5\' 3"'$  (1.6 m). ?  Weight as of this encounter: 88.3 kg.  ?-Weight Loss and Dietary Counseling will be given once she is more awake and alert ?  ?DVT prophylaxis: heparin injection 5,000 Units Start: 10/23/21 0600 ?SCDs Start: 10/19/21 0801 ? ?  Code Status: Full Code ?Family Communication: No family currently at bedside ? ?Disposition Plan:  ?Level of care: Progressive ?Status is: Inpatient ?Remains inpatient appropriate because: Needs continuous dialysis which is a barrier to discharge and also has a tracheostomy ?  ?Consultants:  ?Nephrology ?Neurology ?PCCM transfer ? ?Procedures:  ?Significant Hospital Events: ?Including procedures, antibiotic start and stop dates in addition to other pertinent events   ?3/29 present to [facility], intubated, transfer to [facility] ?  3/30 MRI Brain consistent with PRES ?4/3 off sedation, not waking up ?4/4 transitioned off insulin gtt to SSI ?4/5 [place] conversation; see IPAL note ?4/8 MRI with no acute findings ?4/11 trial of extubation failed, she was reintubated, bedside tracheostomy was  attempted but failed ?4/12 CT neck was done, negative for acute findings ?4/15-tracheostomy, performed by ENT ?4/16 trach collar ?4/18: TCT overnight and yesterday; added seroquel and clonidine taper; weaned off prece

## 2021-10-26 NOTE — Progress Notes (Incomplete)
Report called to 5W 11. ?

## 2021-10-26 NOTE — Progress Notes (Signed)
Patient transferred to 5W from the ICU. Patient with trach and dialysis needs, which is a barrier to discharge. CSW will continue to follow and be in touch with renal navigator.  ? ?Melanie Hall ?LCSW, MSW, MHA ? ?

## 2021-10-26 NOTE — Progress Notes (Signed)
? ?NAME:  Melanie Hall, MRN:  [id number], DOB:  [birth year], LOS: 80 ?ADMISSION DATE:  09/13/2021, CONSULTATION DATE:  10/03/2021 ?REFERRING MD:  Dr. Roderic Palau, ER, CHIEF COMPLAINT:  AMS  ? ?History of Present Illness:  ?51 yo female former smoker with hx of ESRD on HD was found at her home by patient transport driver unresponsive.  Pt's neighbor noted that she was tapping her hands for about a minute, and then stopped.  She has snoring and drooling.  No improvement after narcan.  CBG normal.  Noted to have BP 215/96.  Intubated in [facility] for airway protection, given keppra load, started on diprivan, and started on antibiotics.  CT head negative for acute findings.  Treated for hyperkalemia.  Pt's daughter report she had similar episode on 09/24/21 and EMS was called to the house, but episode resolve and patient declined hospital evaluation.  No prior history of seizures or known head injury.  ER team d/w case with neurology and advised to transfer to [facility] for EEG monitoring and further neurology assessment. ? ?Pertinent  Medical History  ?CHF, Anemia, OA, CAD, DM type 2, ESRD on HD, HLD, HTN, CVA ? ?Significant Hospital Events: ?Including procedures, antibiotic start and stop dates in addition to other pertinent events   ?3/29 present to [facility], intubated, transfer to [facility] ?3/30 MRI Brain consistent with PRES ?4/3 off sedation, not waking up ?4/4 transitioned off insulin gtt to SSI ?4/5 [place] conversation; see IPAL note ?4/8 MRI with no acute findings ?4/11 trial of extubation failed, she was reintubated, bedside tracheostomy was attempted but failed ?4/12 CT neck was done, negative for acute findings ?4/15-tracheostomy, performed by ENT ?4/16 trach collar ?4/18: TCT overnight and yesterday; added seroquel and clonidine taper; weaned off precedex at 5pm ?4/19: remains on TCT >48 hours ? ?Interim History / Subjective:  ? ?No significant change in neurological status bloody drainage from trach noted ? ?Objective   ?Blood pressure  126/60, pulse 94, temperature 99.4 ?F (37.4 ?C), temperature source Axillary, resp. rate 20, height '5\' 3"'$  (1.6 m), weight 82.1 kg, last menstrual period 12/17/2016, SpO2 98 %. ?   ?FiO2 (%):  [28 %] 28 %  ? ?Intake/Output Summary (Last 24 hours) at 10/26/2021 0834 ?Last data filed at 10/26/2021 0000 ?Gross per 24 hour  ?Intake 960 ml  ?Output --  ?Net 960 ml  ? ?Filed Weights  ? 10/18/21 1046 10/22/21 0810 10/24/21 1700  ?Weight: 76.5 kg 84.2 kg 82.1 kg  ? ?Physical Exam: ?General: Ill-appearing female who is unresponsive to verbal stimulus ?HEENT: MM pink/moist #8 tracheostomy is in place bloody drainage cuff is noted to be up good air movement.  Sutures are still in place. ? ? ?Neuro: Poorly responsive does not follow commands disconjugate gaze CV:  ?PULM: Sonorous respirations secondary to tracheal clutter clears with suctioning ?28% trach collar ? ?GI: soft, bsx4 active tube feedings in place ?GU: Amber urine ?Extremities: warm/dry, 1+ lower edema  ?Skin: Sacral wound not visualized ? ? ?Resolved Hospital Problem list   ?Hyperkalemia, resolved ?Hyponatremia, resolved ? ?Assessment & Plan:  ? ?Pres syndrome with associated status epilepticus ?Acute metabolic encephalopathy secondary to pres ?-Last MRI did show resolution of findings consistent with press ?P: ?Off Precedex ?Seroquel and clonidine taper started on 10/24/2021 ?Neurology is following ?Neurochecks as needed ? ? ? ? ?Acute respiratory failure secondary to probable aspiration pneumonia ?-S/p tracheostomy 10/27/2021 by Dr. Constance Holster #8 [place] cuffed ?P: ?Deflate cuff on trach ?Changes from cannula liters negative ?Wean FiO2 as  tolerated ?Minimize suctioning to decrease bloody drainage ?Trach care per protocol ? ? ? ?End stage renal disease on hemodialysis ?P: ?Per renal ? ?HFrEF ?Coronary artery disease/hyperlipidemia ?P: ?Per primary ? ?Type 2 diabetes with hyperglycemia ?CBG (last 3)  ?Recent Labs  ?  10/25/21 ?2337 10/26/21 ?6720 10/26/21 ?9470  ?GLUCAP 239*  148* 137*  ? ? ?P: ?Sliding-scale insulin protocol ? ?Hypothyroidism ?P: ?Continue Synthroid ? ?Unstageable sacral ulcer ?P: ?Wound care as needed ? ?Best Practice (right click and "Reselect all SmartList Selections" daily)  ?Per primary ?Diet/type: tubefeeds ?DVT prophylaxis: prophylactic heparin  ?GI prophylaxis: PPI ?Lines: N/A ?Foley:  N/A ?Code Status:  full code ?GOC: 4/19 called daughter [name] over phone and updated ? ? ? ?Richardson Landry Lanecia Sliva ACNP ?Acute Care Nurse Practitioner ?[place] ?Please consult Amion ?10/26/2021, 8:34 AM ? ? ? ?

## 2021-10-27 ENCOUNTER — Inpatient Hospital Stay (HOSPITAL_COMMUNITY): Payer: Medicaid Other

## 2021-10-27 DIAGNOSIS — Z515 Encounter for palliative care: Secondary | ICD-10-CM

## 2021-10-27 DIAGNOSIS — G9340 Encephalopathy, unspecified: Secondary | ICD-10-CM | POA: Diagnosis not present

## 2021-10-27 DIAGNOSIS — I6783 Posterior reversible encephalopathy syndrome: Secondary | ICD-10-CM | POA: Diagnosis not present

## 2021-10-27 DIAGNOSIS — N186 End stage renal disease: Secondary | ICD-10-CM | POA: Diagnosis not present

## 2021-10-27 DIAGNOSIS — R4182 Altered mental status, unspecified: Secondary | ICD-10-CM | POA: Diagnosis not present

## 2021-10-27 LAB — CBC WITH DIFFERENTIAL/PLATELET
Abs Immature Granulocytes: 0.28 10*3/uL — ABNORMAL HIGH (ref 0.00–0.07)
Basophils Absolute: 0 10*3/uL (ref 0.0–0.1)
Basophils Relative: 0 %
Eosinophils Absolute: 0.4 10*3/uL (ref 0.0–0.5)
Eosinophils Relative: 3 %
HCT: 24.2 % — ABNORMAL LOW (ref 36.0–46.0)
Hemoglobin: 8 g/dL — ABNORMAL LOW (ref 12.0–15.0)
Immature Granulocytes: 3 %
Lymphocytes Relative: 13 %
Lymphs Abs: 1.4 10*3/uL (ref 0.7–4.0)
MCH: 32.5 pg (ref 26.0–34.0)
MCHC: 33.1 g/dL (ref 30.0–36.0)
MCV: 98.4 fL (ref 80.0–100.0)
Monocytes Absolute: 1.5 10*3/uL — ABNORMAL HIGH (ref 0.1–1.0)
Monocytes Relative: 14 %
Neutro Abs: 7.1 10*3/uL (ref 1.7–7.7)
Neutrophils Relative %: 67 %
Platelets: 226 10*3/uL (ref 150–400)
RBC: 2.46 MIL/uL — ABNORMAL LOW (ref 3.87–5.11)
RDW: 17.6 % — ABNORMAL HIGH (ref 11.5–15.5)
WBC: 10.6 10*3/uL — ABNORMAL HIGH (ref 4.0–10.5)
nRBC: 0 % (ref 0.0–0.2)

## 2021-10-27 LAB — COMPREHENSIVE METABOLIC PANEL
ALT: 25 U/L (ref 0–44)
AST: 30 U/L (ref 15–41)
Albumin: 2.3 g/dL — ABNORMAL LOW (ref 3.5–5.0)
Alkaline Phosphatase: 134 U/L — ABNORMAL HIGH (ref 38–126)
Anion gap: 10 (ref 5–15)
BUN: 34 mg/dL — ABNORMAL HIGH (ref 6–20)
CO2: 26 mmol/L (ref 22–32)
Calcium: 8.4 mg/dL — ABNORMAL LOW (ref 8.9–10.3)
Chloride: 97 mmol/L — ABNORMAL LOW (ref 98–111)
Creatinine, Ser: 3.17 mg/dL — ABNORMAL HIGH (ref 0.44–1.00)
GFR, Estimated: 17 mL/min — ABNORMAL LOW (ref >60)
Glucose, Bld: 182 mg/dL — ABNORMAL HIGH (ref 70–99)
Potassium: 3.2 mmol/L — ABNORMAL LOW (ref 3.5–5.1)
Sodium: 133 mmol/L — ABNORMAL LOW (ref 135–145)
Total Bilirubin: 0.6 mg/dL (ref 0.3–1.2)
Total Protein: 6.2 g/dL — ABNORMAL LOW (ref 6.5–8.1)

## 2021-10-27 LAB — RENAL FUNCTION PANEL
Albumin: 2.5 g/dL — ABNORMAL LOW (ref 3.5–5.0)
Anion gap: 12 (ref 5–15)
BUN: 40 mg/dL — ABNORMAL HIGH (ref 6–20)
CO2: 26 mmol/L (ref 22–32)
Calcium: 8.7 mg/dL — ABNORMAL LOW (ref 8.9–10.3)
Chloride: 96 mmol/L — ABNORMAL LOW (ref 98–111)
Creatinine, Ser: 3.6 mg/dL — ABNORMAL HIGH (ref 0.44–1.00)
GFR, Estimated: 15 mL/min — ABNORMAL LOW (ref >60)
Glucose, Bld: 108 mg/dL — ABNORMAL HIGH (ref 70–99)
Phosphorus: 4.5 mg/dL (ref 2.5–4.6)
Potassium: 4.6 mmol/L (ref 3.5–5.1)
Sodium: 134 mmol/L — ABNORMAL LOW (ref 135–145)

## 2021-10-27 LAB — MAGNESIUM: Magnesium: 2 mg/dL (ref 1.7–2.4)

## 2021-10-27 LAB — GLUCOSE, CAPILLARY
Glucose-Capillary: 152 mg/dL — ABNORMAL HIGH (ref 70–99)
Glucose-Capillary: 100 mg/dL — ABNORMAL HIGH (ref 70–99)
Glucose-Capillary: 178 mg/dL — ABNORMAL HIGH (ref 70–99)
Glucose-Capillary: 128 mg/dL — ABNORMAL HIGH (ref 70–99)
Glucose-Capillary: 108 mg/dL — ABNORMAL HIGH (ref 70–99)
Glucose-Capillary: 150 mg/dL — ABNORMAL HIGH (ref 70–99)

## 2021-10-27 LAB — PHOSPHORUS: Phosphorus: 3.4 mg/dL (ref 2.5–4.6)

## 2021-10-27 IMAGING — DX DG CHEST 1V PORT
1 series · 1 of 1 positions shown · non-contrast
Comparison: [DATE]

CLINICAL DATA: 50-year-old female with history of stroke

EXAM:
PORTABLE CHEST - 1 VIEW

[chest]
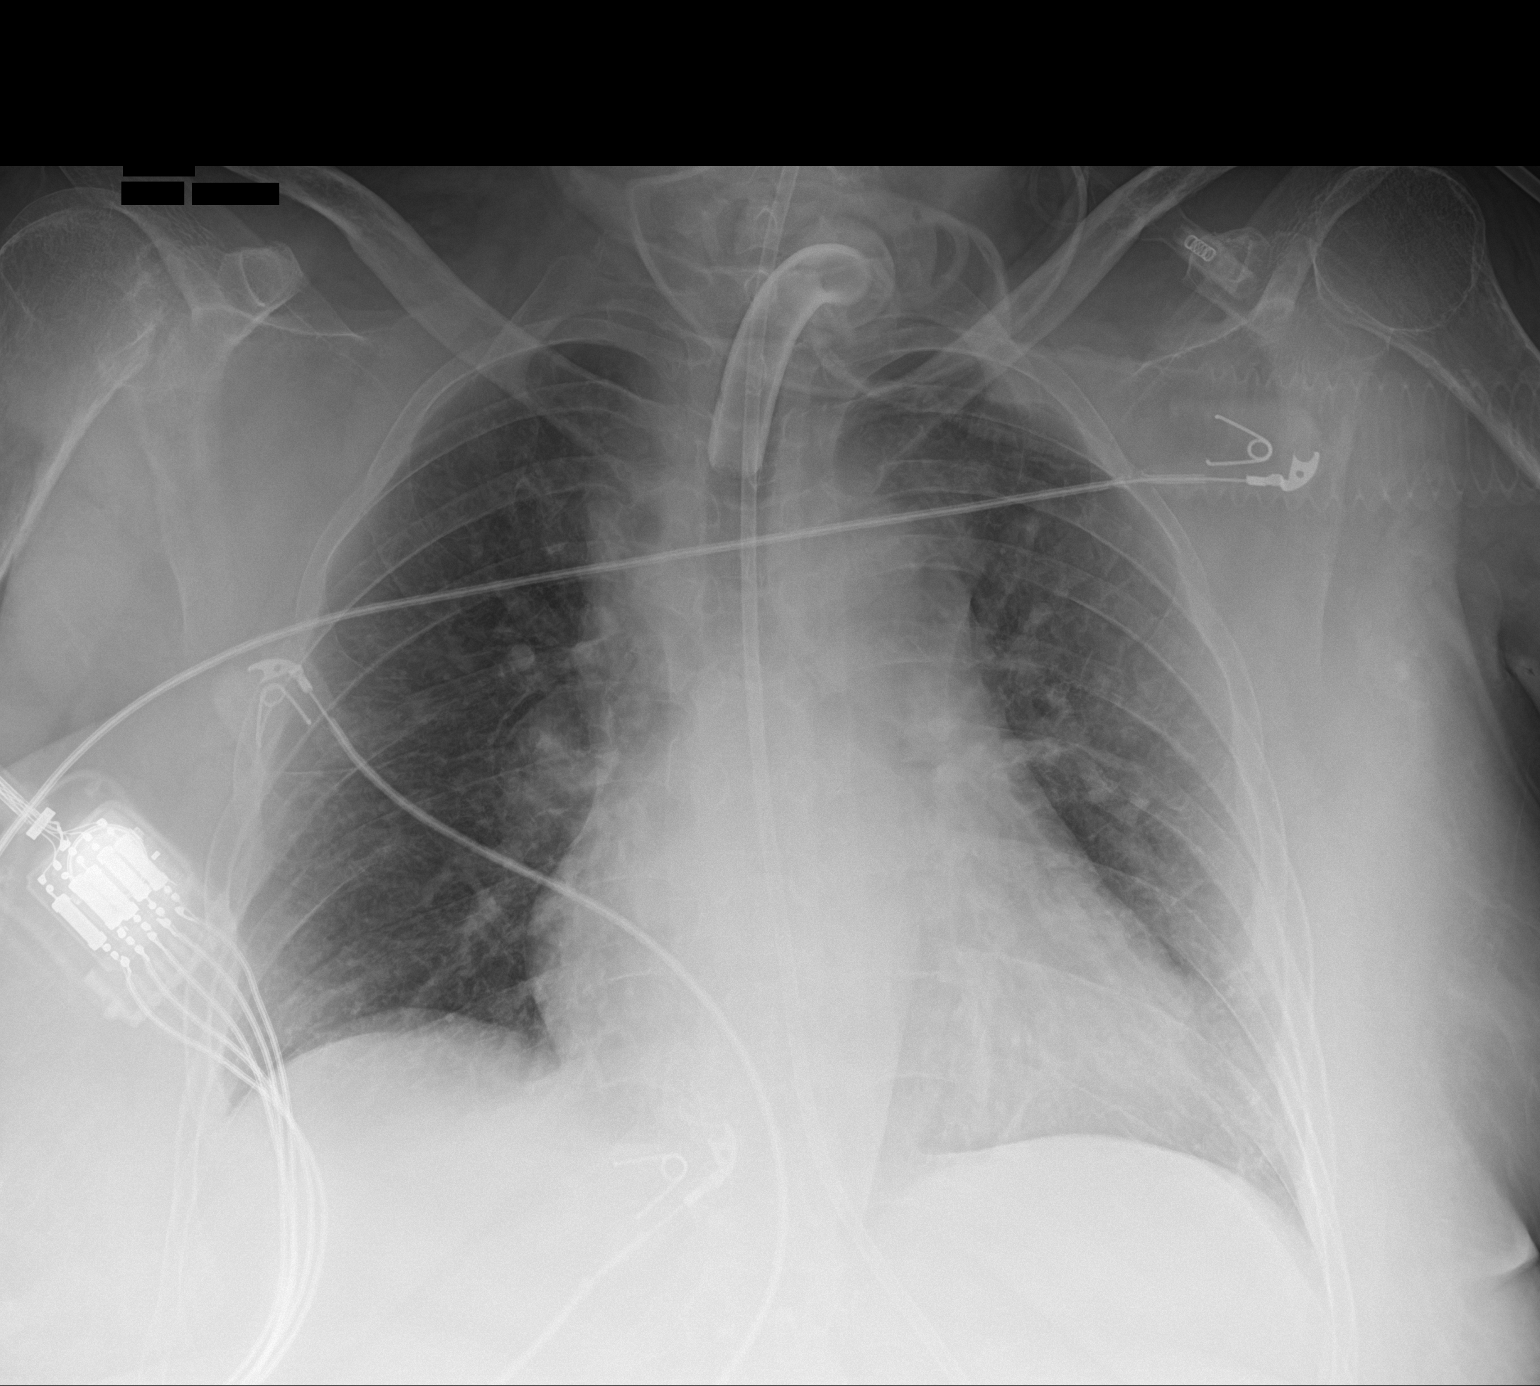

[1 of 1 positions shown; findings below may reference images not displayed]

FINDINGS: The mediastinal contours are within normal limits. Unchanged
cardiomegaly. Interval removal of endotracheal tube and creation of
tracheostomy, with tracheostomy cannula tip in the mid superior
thoracic trachea. Enteric feeding tube in place coursing off the
inferior aspect of this image. The lungs are clear bilaterally,
improved from comparison, without evidence of focal consolidation,
pleural effusion, or pneumothorax. No acute osseous abnormality.
IMPRESSION: 1. Interval tracheostomy which appears well positioned.
2. Improved aeration of the lungs bilaterally.

## 2021-10-27 MED ORDER — CHLORHEXIDINE GLUCONATE CLOTH 2 % EX PADS
6.0000 | MEDICATED_PAD | Freq: Every day | CUTANEOUS | Status: DC
  Administered 2021-10-27 – 2021-10-30 (×3): 6 via TOPICAL

## 2021-10-27 NOTE — Plan of Care (Signed)

## 2021-10-27 NOTE — Progress Notes (Signed)
?[facility] ?Progress Note  ? ?Subjective:   Patient seen and examined at bedside.  Remains non-responsive, moving around in bed. Blood present in trach - respiratory therapy aware.  ? ?Objective ?Vitals:  ? 10/27/21 0400 10/27/21 0759 10/27/21 0824 10/27/21 0850  ?BP: 120/65 (!) 189/88  (!) 192/90  ?Pulse: 86 (!) 102 (!) 102 (!) 102  ?Resp: 19 19 (!) 21   ?Temp: 98.2 ?F (36.8 ?C) 98.8 ?F (37.1 ?C)    ?TempSrc: Axillary Axillary    ?SpO2: 98%  98%   ?Weight:      ?Height:      ? ?Physical Exam ?General:ill appearing female in NAD, non-responsive in bed ?Heart:RRR, no mrg ?Lungs:CTAB, trach to O2 ?Abdomen:soft, NTND, NG tube in place ?Extremities:no LE edema ?Dialysis Access: LU AVF +b/t  ? ?Filed Weights  ? 10/24/21 1700 10/26/21 1017 10/26/21 1400  ?Weight: 82.1 kg 90 kg 88.3 kg  ? ? ?Intake/Output Summary (Last 24 hours) at 10/27/2021 1052 ?Last data filed at 10/26/2021 1540 ?Gross per 24 hour  ?Intake 765 ml  ?Output 2000 ml  ?Net -1235 ml  ? ? ?Additional Objective ?Labs: ?Basic Metabolic Panel: ?Recent Labs  ?Lab 10/25/21 ?0719 10/26/21 ?0159 10/27/21 ?1017 10/27/21 ?0727  ?NA 134* 130* 133* 134*  ?K 4.2 3.8 3.2* 4.6  ?CL 96* 95* 97* 96*  ?CO2 _0 ?GLUCOSE 124* 192* 182* 108*  ?BUN 42* 69* 34* 40*  ?CREATININE 3.43* 4.90* 3.17* 3.60*  ?CALCIUM 8.4* 8.4* 8.4* 8.7*  ?PHOS 4.8*  --  3.4 4.5  ? ?Liver Function Tests: ?Recent Labs  ?Lab 10/25/21 ?0719 10/27/21 ?5102 10/27/21 ?0727  ?AST  --  30  --   ?ALT  --  25  --   ?ALKPHOS  --  134*  --   ?BILITOT  --  0.6  --   ?PROT  --  6.2*  --   ?ALBUMIN 2.4* 2.3* 2.5*  ? ?CBC: ?Recent Labs  ?Lab 10/22/21 ?0208 10/24/21 ?0251 10/25/21 ?0106 10/26/21 ?0159 10/27/21 ?0050  ?WBC 9.3 10.3 10.3 11.7* 10.6*  ?NEUTROABS  --  6.2 6.7  --  7.1  ?HGB 7.9* 7.0* 8.0* 7.6* 8.0*  ?HCT 23.6* 21.1* 23.9* 23.8* 24.2*  ?MCV 99.2 102.4* 98.0 100.8* 98.4  ?PLT 312 280 282 252 226  ? ? ?CBG: ?Recent Labs  ?Lab 10/26/21 ?1530 10/26/21 ?2054 10/26/21 ?2333 10/27/21 ?5852  10/27/21 ?0802  ?GLUCAP 134* 168* 200* 152* 100*  ? ?Studies/Results: ?DG CHEST PORT 1 VIEW ? ?Result Date: 10/27/2021 ?CLINICAL DATA:  51 year old female with history of stroke EXAM: PORTABLE CHEST - 1 VIEW COMPARISON:  10/20/2021 FINDINGS: The mediastinal contours are within normal limits. Unchanged cardiomegaly. Interval removal of endotracheal tube and creation of tracheostomy, with tracheostomy cannula tip in the mid superior thoracic trachea. Enteric feeding tube in place coursing off the inferior aspect of this image. The lungs are clear bilaterally, improved from comparison, without evidence of focal consolidation, pleural effusion, or pneumothorax. No acute osseous abnormality. IMPRESSION: 1. Interval tracheostomy which appears well positioned. 2. Improved aeration of the lungs bilaterally. Electronically Signed   By: Ruthann Cancer M.D.   On: 10/27/2021 08:07   ? ?Medications: ? sodium chloride Stopped (10/15/2021 2020)  ? feeding supplement (NEPRO CARB STEADY) 1,000 mL (10/25/21 1607)  ? ferric gluconate (FERRLECIT) IVPB Stopped (10/26/21 1514)  ? ? aspirin  81 mg Per Tube Daily  ? atorvastatin  80 mg Per Tube q1800  ? calcitRIOL  0.25 mcg Per  Tube Q T,Th,Sat-1800  ? carvedilol  6.25 mg Per Tube BID WC  ? chlorhexidine gluconate (MEDLINE KIT)  15 mL Mouth Rinse BID  ? Chlorhexidine Gluconate Cloth  6 each Topical Q0600  ? cholestyramine  4 g Per Tube TID  ? cloNIDine  0.1 mg Per Tube Daily  ? darbepoetin (ARANESP) injection - DIALYSIS  150 mcg Intravenous Q Thu-HD  ? ezetimibe  10 mg Per Tube Daily  ? feeding supplement (PROSource TF)  45 mL Per Tube TID  ? fiber  1 packet Per Tube TID  ? folic acid  1 mg Per Tube Daily  ? gabapentin  300 mg Per Tube Q8H  ? heparin injection (subcutaneous)  5,000 Units Subcutaneous Q8H  ? hydrALAZINE  50 mg Per Tube BID  ? insulin aspart  0-15 Units Subcutaneous Q4H  ? insulin aspart  5 Units Subcutaneous Q4H  ? insulin detemir  15 Units Subcutaneous BID  ? lacosamide  50 mg  Per Tube BID  ? leptospermum manuka honey  1 application. Topical UD  ? levothyroxine  50 mcg Per Tube QAC breakfast  ? mouth rinse  15 mL Mouth Rinse 10 times per day  ? multivitamin  1 tablet Per Tube QHS  ? pantoprazole sodium  40 mg Per Tube Daily  ? QUEtiapine  50 mg Per Tube BID  ? thiamine  100 mg Per Tube Daily  ? vitamin B-12  1,000 mcg Per Tube Daily  ? ? ?Dialysis Orders: ?TTS [facility] ?  3h 35mn  75.5kg  2/2.5 bath  400 bfr   Hep none LUE AVF ? - 4/14 > hep B Ag negative and hep B Ab's low/ not protective ? - mircera 200 q2 wks ? - venofer 50 weekly ? - rocaltrol 0.25 tiw ?  ?Assessment/Plan: ?AMS: Found unresponsive at home. Had PRES and seizures, seen by neurology. MS remains poor. MRI has been improving. Sz meds lowered and getting provigil to promote wakefulness. Per neuro/ pmd.  ?Resp failure: Initially intubated for airway protection, s/p trach 4/15 by ENT. Now off vent, with trach collar O2.  Blood present in trach collar, Respiratory aware.  ?ESRD: Usual TTS schedule - HD tomorrow per regular schedule.  ?HTN/ Volume: BP initially elevated this AM and now soft after morning med dose.  carvedilol reduced 247mBID -> 6.2544mID, hydralazine reduced 100m11mD -> 50mg70m. Clonidine 0.1 given this AM, now has been d/c'd. Hoyer wt = 82kg. UF as tolerated. ?T2DM: Insulin per primary. ?Possible sepsis: S/p short course IV abx. Blood Cx 3/29 negative. ?Anemia of ESRD: Hgb^8.0. Continue Aranesp 150mcg83mhusday and weekly IV Fe here.  ?Secondary hyperparathyroidism: CorrCa/Phos ok. Continue VDRA, no binder for now. ?CAD/ HL: On BB, asa, statin; per pmd.  ?Nutrition: Nepro TF + supplements. ?Unstageable sacral ulcer: Wound care following. ?Dispo: Placement for a patient on dialysis with a trach is exceedingly difficult as everyone is aware. ? ?LindsaJen Mow ?[facility] ?10/27/2021,10:52 AM ? LOS: 23 days  ? ? ?

## 2021-10-27 NOTE — Progress Notes (Signed)
I spoke with Dr. Halford Chessman about removing pts trach sutures to be able to change trach gauze and clean around stoma. Dr. Halford Chessman prefers for RT to wait until the weekend to remove trach sutures.  ?

## 2021-10-27 NOTE — TOC Progression Note (Addendum)
Transition of Care (TOC) - Progression Note  ? ? ?Patient Details  ?Name: DAIZY OUTEN ?MRN: [id number] ?Date of Birth: [birth year] ? ?Transition of Care (TOC) CM/SW Contact  ?Verdell Carmine, RN ?Phone Number: ?10/27/2021, 12:29 PM ? ?Clinical Narrative:    ? ? ?Patient on day 23, trach and cortrak feeding tube.She has two daughters, one in [place] and one in [place]. Palliative care has been consulted, patient is unresponsive  MRI shows PRES, on dialysis. Ativan for seizure activity. Her trach collar is 5L on cortrak feedings. Will need to decide to pursue PEG and SNF, however barriers to this are insurance ( the patient has Medicaid) and no dialysis center takes trachs. That TOC is aware of , will message CSW renal for more insight ?1354 The above  is confirmed as accurate, you cannot have a trach and go to outpatient dialysis. ? TOC will continue to follow for needs, recommendations, and transitions.  ? ? ?Barriers to Discharge: Ship broker, Inadequate or no insurance, Continued Medical Work up ? ?Expected Discharge Plan and Services ?  ?  ?Discharge Planning Services: CM Consult ?  ?Living arrangements for the past 2 months: [place] ?                ?  ?  ?  ?  ?  ?  ?  ?  ?  ?  ? ? ?Social Determinants of Health (SDOH) Interventions ?  ? ?Readmission Risk Interventions ?   ? View : No data to display.  ?  ?  ?  ? ? ?

## 2021-10-27 NOTE — Consult Note (Signed)
?Palliative Medicine Inpatient Consult Note ? ?Consulting Provider: Kerney Elbe, DO ? ?Reason for consult:   ?[place] Palliative Medicine Consult  ?Reason for Consult? GOC; Failure to Improve and now Trached and has a Cortrak  ? ?HPI:  ?Per intake H&P --> The patient is a 51 year old former smoker with past medical history significant for but not limited to history of ESRD on hemodialysis, CHF, anemia, osteoarthritis, history of CAD, history of diabetes mellitus type 2, hypertension, hyperlipidemia, history of CVA as well as other comorbidities who was found at her home by her patient transport driver to be unresponsive.  Patient's neighbor noted that she was tapping her hands for about a minute and then stop.  She was snoring and drooling and had no improvement after Narcan was given.  She is noted to have a normal CBG and blood pressure was 215/96 on admission.  She was intubated in a.m. pain hospital for airway protection, given a Keppra load and started on dipper Van and started on antibiotics.  Head CT was negative for any acute findings and she was treated for hyperkalemia.  She did similar episode on 09/24/2021 when EMS was called to the house but the episode resolved and patient declined hospital evaluation.  She had no prior history of seizures or known head injury and the ED team discussed with neurology and they advised the patient to be transferred to [facility] for EEG monitoring and further neurology assessment. ? ?Palliative care has been asked to get involved to have further goals of care conversations.  ? ?Clinical Assessment/Goals of Care: ? ?*Please note that this is a verbal dictation therefore any spelling or grammatical errors are due to the "[place]" system interpretation. ? ?I have reviewed medical records including EPIC notes, labs and imaging, received report from bedside RN, assessed the patient.  ?  ?I called patient's oldest daughter, Jerilynn Som to further discuss diagnosis prognosis, [place], EOL wishes, disposition and options. ?  ?I introduced Palliative Medicine as specialized medical care for people living with serious illness. It focuses on providing relief from the symptoms and stress of a serious illness. The goal is to improve quality of life for both the patient and the family. ? ?Medical History Review and Understanding: ? ?States he and I reviewed Sandy's past medical history inclusive of her end-stage renal disease for which she has been on dialysis over the past 3 years, type 2 diabetes mellitus, seizures, and coronary artery disease.  Overall patient has not been in well health for quite some time now. ? ?Social History: ? ?[name] lives in [place].  She is not married though has a longtime boyfriend of 8 years.  Her boyfriend lives in [place] and sees her Saturday through Tuesday he is 15 years younger than her and speaks little [place].  [name] has 4 daughters 2 daughters from 58 relationship ages 30 &31 and 2 daughters from another relationship ages 10 & 54.  Three of her daughters live locally while, one daughter lives in [place] - [place].  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  She is a woman of faith and practices within the [employer] denomination ? ?Functional and Nutritional State: ? ?Prior to admission Dorothyann Peng was able to mobilize with a front wheel walker.  She was living independently in a home. ? ?Appetite leading to admission was fair. ? ?Advance Directives: ? ?  A detailed discussion was had today regarding advanced directives.  Patient has never completed advanced directives though her daughters at collectively regarding decisions. ? ?Code Status: ? ?Encouraged [name] to consider DNAR status understanding evidenced based poor outcomes in similar hospitalized patient, as the cause of arrest is likely associated with advanced  chronic/terminal illness rather than an easily reversible acute cardio-pulmonary event. I explained that DNAR does not change the medical plan and it only comes into effect after a person has arrested (died).  It is a protective measure to keep [place] from harming the patient in their last moments of life. [name] was agreeable to DNAR with understanding that patient would not receive CPR, defibrillation, ACLS medications, or be placed back on the mechanical ventilator in the event that she is found pulseless.   ? ?Discussion: ? ?States she shares with me that per discussion with her sisters 2 weeks ago the decision was made to pursue more aggressive measures such as tracheostomy with her mother [name].  She expresses that her mother has had a very difficult life and was never given a fair shake at things.  Because of this she feels that it is her duty to try to provide her mother every opportunity for her health state to improve. ? ?A conversation was had regarding Sandy's multiple chronic comorbid disease processes and how these will affect her ability to improve.  We also reviewed as part of this her ongoing encephalopathy and inability to meaningfully follow directions.  I shared my concerns that often times when patients go to long-term acute care hospitals we see they begin to decline and are susceptible to an increase burden of infection such as pneumonia, urinary tract infection, sacral decubiti.  I shared that often this occurs and patient's balance between the hospital to the acute care facility and this cycle continues.  States he expresses that she is seeing her mother suffer tremendously though does desire to give her more time to see if she can make improvement.  She expresses if her mother should be at a point where she continues to cycle in and out of the acute care setting and she continues to decline as opposed to improve she would be open further to comfort mediated care.  ? ?We further reviewed that at  anytime [facility] could decompensate which would change the direction of care. [name] is very much aware of patient tenuous clinical condition.  ? ?At this point in time the goals are for continued care and allowing time for outcomes.  [name] is now a DNAR as above.  Case management will continue to seek placement though this may be complicated due to patient's tracheostomy and need for hemodialysis. ? ?Additional concerns includes patient's inability to eat and drink in the setting of encephalopathy. Plan to reach out to speech therapy in the oncoming days. If continues to need coretrack will further broach topic of PEG. ? ?Discussed the importance of continued conversation with family and their  medical providers regarding overall plan of care and treatment options, ensuring decisions are within the context of the patients values and GOCs. ? ?Decision Maker: ?Jerilynn Som (daughter) [phone] ? ?SUMMARY OF RECOMMENDATIONS   ?DNAR ? ?Patients daughter would like to allow more time to see if patient improvements can be made ? ?Patient has a coretrack - w/o ability to eat/drink d/t encephalopathy if no improvements with speech therapy will further discuss a PEG with patients daughter ? ?Plan for OP Palliative support  ? ?Ongoing conversations regarding  goals of care in the oncoming days ? ?Code Status/Advance Care Planning: ?DNAR ? ?Palliative Prophylaxis:  ?Aspiration, Bowel Regimen, Delirium Protocol, Frequent Pain Assessment, Oral Care, Palliative Wound Care, and Turn Reposition ? ?Additional Recommendations (Limitations, Scope, Preferences): ?Treat what is treatable ? ?Psycho-social/Spiritual:  ?Desire for further Chaplaincy support: Yes ?Additional Recommendations: Education on chronic disease burden ?  ?Prognosis: Patient has multiple comorbidities and has endured a prolonged hospitalization placing her at a high 65-monthmortality risk. ? ?[facility] ? ?Vitals:  ? 10/27/21 1459 10/27/21 1500  ?BP: 107/90   ?Pulse: 88   ?Resp: 20 20  ?Temp: 99.7 ?F (37.6 ?C)   ?SpO2: 98%   ? ? ?Intake/Output Summary (Last 24 hours) at 4/21/2

## 2021-10-27 NOTE — Progress Notes (Signed)
? ?  Palliative Medicine Inpatient Follow Up Note ? ?Palliative care has acknowledged the consult for [facility].  ? ?Patients daughter, Gaylan Gerold called over the phone as she has been patients primary caregiver and per other daughters (there are four total) and is patients decision maker. A message has been left to set up a date and time for a formal meeting. Awaiting a call back. ? ?No Charge ?______________________________________________________________________________________ ?Tacey Ruiz ?[place] Team ?Team Cell Phone: [phone] ?Please utilize secure chat with additional questions, if there is no response within 30 minutes please call the above phone number ? ?Palliative Medicine Team providers are available by phone from 7am to 7pm daily and can be reached through the team cell phone.  ?Should this patient require assistance outside of these hours, please call the patient's attending physician. ? ? ? ? ?

## 2021-10-27 NOTE — Progress Notes (Signed)
?PROGRESS NOTE ? ? ? ?Melanie Hall  IOE:[id number] DOB: Nov 17, 1970 DOA: 10/01/2021 ?PCP: Renee Rival, FNP  ? ?Brief Narrative:  ?The patient is a 51 year old former smoker with past medical history significant for but not limited to history of ESRD on hemodialysis, CHF, anemia, osteoarthritis, history of CAD, history of diabetes mellitus type 2, hypertension, hyperlipidemia, history of CVA as well as other comorbidities who was found at her home by her patient transport driver to be unresponsive.  Patient's neighbor noted that she was tapping her hands for about a minute and then stop.  She was snoring and drooling and had no improvement after Narcan was given.  She is noted to have a normal CBG and blood pressure was 215/96 on admission.  She was intubated in a[facility]m. pain hospital for airway protection, given a Keppra load and started on dipper Van and started on antibiotics.  Head CT was negative for any acute findings and she was treated for hyperkalemia.  She did similar episode on 09/24/2021 when EMS was called to the house but the episode resolved and patient declined hospital evaluation.  She had no prior history of seizures or known head injury and the ED team discussed with neurology and they advised the patient to be transferred to [facility] for EEG monitoring and further neurology assessment. ? ?Current significant hospital events are as below:  ?Significant Hospital Events: ?Including procedures, antibiotic start and stop dates in addition to other pertinent events   ?3/29 present to [facility], intubated, transfer to [facility] ?3/30 MRI Brain consistent with PRES ?4/3 off sedation, not waking up ?4/4 transitioned off insulin gtt to SSI ?4/5 [place] conversation; see IPAL note ?4/8 MRI with no acute findings ?4/11 trial of extubation failed, she was reintubated, bedside tracheostomy was attempted but failed ?4/12 CT neck was done, negative for acute findings ?4/15-tracheostomy, performed by ENT ?4/16 trach  collar ?4/18: TCT overnight and yesterday; added seroquel and clonidine taper; weaned off precedex at 5pm ?4/19: remains on TCT >48 hours  ?4/20: Transferred to [place] and underwent Dialysis again.  ?  ?Assessment and Plan: ?No notes have been filed under this hospital service. ?Service: Hospitalist ? ?Pres syndrome with associated status epilepticus ?Acute metabolic encephalopathy secondary to PRES ?-Last MRI did show resolution of findings consistent with press ?-precedex off 4/18 ?-seroquel and clonidine taper started 4/18 ?-cont vimpat and provigil per neuro for seizure ppx ?-cont gabapentin and seroquel  ?-C/w IV Ativan 2 mg for clinical Seziure-like activity ?-frequent neurochecks; seizure precaution in place ?-Continues to be significantly encephalopathic and was not responsive to examination again ?-Neurology following and she was getting Provigil to promote wakefulness ?-Palliative Care consulted for [place] Discussions ?  ?Acute respiratory failure secondary to probable aspiration pneumonia S/p tracheostomy because of Prolonged Vent Dependence due to Encephalopathy ?-Continue trach collar as tolerated; has been on for >48 hours ?-prn tracheal suction ?-trach care per protocol routine trach care; RRT discussed about patient's trach sutures and the ability to change the trach gauze and clean around the stoma but pulmonary prefers for the RT to wait until the weekend to remove the trach sutures ?-Recommending change to cuffless trach per ENT recommendations ?-SpO2: 98 % ?O2 Flow Rate (L/min): 5 L/min ?FiO2 (%): 28 % ?-Continue supplemental oxygen via ATC to maintain O2 saturations greater than 90% ?-Continue supportive care: Palliative care consulted for goals of care discussion ?-She has had a Cortrak in place and will need to change out to either a PEG and  will have further goals of care discussion the patient's family ? ?End stage renal disease on hemodialysis Tuesday Thursday  Saturday ?Hyponatremia ?-nephro following ?-cont iHD ?-Trend BMP / urinary output ?-Replace electrolytes as indicated ?-Patient's BUNs/creatinine is now 40/3.60 ?-Sodium is now improved slightly and went from 133 is now 134 ?-Avoid nephrotoxic agents, contrast dyes, ensure adequate renal perfusion to prevent dehydration and hypoperfusion and renally dose medications ?-Repeat CMP in a.m. ?  ?Anemia of chronic disease ?-Patient's hemoglobin/hematocrit is now 8.0/24 point with an MCV 98.4 ?-Check anemia panel in a.m. ?-Continue monitor for signs and symptoms of bleeding; no overt bleeding noted but she did have some blood on trach ?-Repeat CBC in a.m. ? ?HFrEF ?Coronary artery disease/hyperlipidemia ?-BP soft overnight since starting clonidine taper ?-will decrease dose of hydralazine ?-cont carvedilol and clonidine ?-cont ASA and statin ?-cont zetia ?-Continue volume maintenance with hemodialysis ? ?Fever ?-Unclear etiology but could be a drug fever ?-Had a Tmax of 101.3 ?-Check blood cultures x2 ?-Chest x-ray shows improved aeration ?-Check urinalysis ?-If continues to Spike temperatures may consult ID for further evaluation and obtain a TTE ?  ?Leukocytosis ?-WBC went from 10.3 -> 11.7 -> 10.6 ?-Continue to monitor for signs and symptoms of infection ?-Repeat CBC in the AM  ? ?Type 2 diabetes with hyperglycemia ?-cont SSI and cbg monitoring ?-cont basal and TF coverage ?-CBGs ranging from 100-200 ? ?Hypothyroidism ?-Continue with her home Levothyroxine 50 mcg per Tube ? ?Unstageable sacral ulcer ?-wound care ?Pressure Injury 09/10/2021 Toe (Comment  which one) Anterior;Right Deep Tissue Pressure Injury - Purple or maroon localized area of discolored intact skin or blood-filled blister due to damage of underlying soft tissue from pressure and/or shear. (Active)  ?09/07/2021 1551  ?Location: Toe (Comment  which one)  ?Location Orientation: Anterior;Right  ?Staging: Deep Tissue Pressure Injury - Purple or maroon localized  area of discolored intact skin or blood-filled blister due to damage of underlying soft tissue from pressure and/or shear.  ?Wound Description (Comments):   ?Present on Admission: Yes  ?   ?Pressure Injury 09/11/2021 Sacrum Medial Stage 1 -  Intact skin with non-blanchable redness of a localized area usually over a bony prominence. (Active)  ?09/20/2021 1553  ?Location: Sacrum  ?Location Orientation: Medial  ?Staging: Stage 1 -  Intact skin with non-blanchable redness of a localized area usually over a bony prominence.  ?Wound Description (Comments):   ?Present on Admission: Yes  ?   ?Pressure Injury Posterior;Right Deep Tissue Pressure Injury - Purple or maroon localized area of discolored intact skin or blood-filled blister due to damage of underlying soft tissue from pressure and/or shear. purple (Active)  ?   ?Location:   ?Location Orientation: Posterior;Right  ?Staging: Deep Tissue Pressure Injury - Purple or maroon localized area of discolored intact skin or blood-filled blister due to damage of underlying soft tissue from pressure and/or shear.  ?Wound Description (Comments): purple  ?Present on Admission:   ?   ?Pressure Injury 10/16/21 Anus Upper Stage 3 -  Full thickness tissue loss. Subcutaneous fat may be visible but bone, tendon or muscle are NOT exposed. brown slough (Active)  ?10/16/21 1802  ?Location: Anus  ?Location Orientation: Upper  ?Staging: Stage 3 -  Full thickness tissue loss. Subcutaneous fat may be visible but bone, tendon or muscle are NOT exposed.  ?Wound Description (Comments): brown slough  ?Present on Admission: No  ?  ?Obesity ?-Complicates overall prognosis and care ?-Estimated body mass index is 34.48 kg/m? as calculated from the  following: ?  Height as of this encounter: '5\' 3"'$  (1.6 m). ?  Weight as of this encounter: 88.3 kg.  ?-Weight Loss and Dietary Counseling will be given once she is much more awake and alert ? ?  ? ?DVT prophylaxis: heparin injection 5,000 Units Start: 10/23/21  0600 ?SCDs Start: 10/19/21 0801 ? ?  Code Status: Full Code ?Family Communication: No family currently at bedside ? ?Disposition Plan:  ?Level of care: Progressive ?Status is: Inpatient ?Remains inpatient appropriate because: She continues

## 2021-10-28 ENCOUNTER — Inpatient Hospital Stay (HOSPITAL_COMMUNITY): Payer: Medicaid Other

## 2021-10-28 DIAGNOSIS — R4182 Altered mental status, unspecified: Secondary | ICD-10-CM | POA: Diagnosis not present

## 2021-10-28 DIAGNOSIS — G9340 Encephalopathy, unspecified: Secondary | ICD-10-CM | POA: Diagnosis not present

## 2021-10-28 DIAGNOSIS — N186 End stage renal disease: Secondary | ICD-10-CM | POA: Diagnosis not present

## 2021-10-28 DIAGNOSIS — Z515 Encounter for palliative care: Secondary | ICD-10-CM | POA: Diagnosis not present

## 2021-10-28 DIAGNOSIS — Z7189 Other specified counseling: Secondary | ICD-10-CM | POA: Diagnosis not present

## 2021-10-28 DIAGNOSIS — I6783 Posterior reversible encephalopathy syndrome: Secondary | ICD-10-CM | POA: Diagnosis not present

## 2021-10-28 LAB — CBC WITH DIFFERENTIAL/PLATELET
Abs Immature Granulocytes: 0.31 10*3/uL — ABNORMAL HIGH (ref 0.00–0.07)
Basophils Absolute: 0 10*3/uL (ref 0.0–0.1)
Basophils Relative: 0 %
Eosinophils Absolute: 0.5 10*3/uL (ref 0.0–0.5)
Eosinophils Relative: 5 %
HCT: 24.5 % — ABNORMAL LOW (ref 36.0–46.0)
Hemoglobin: 8.2 g/dL — ABNORMAL LOW (ref 12.0–15.0)
Immature Granulocytes: 3 %
Lymphocytes Relative: 14 %
Lymphs Abs: 1.5 10*3/uL (ref 0.7–4.0)
MCH: 32.8 pg (ref 26.0–34.0)
MCHC: 33.5 g/dL (ref 30.0–36.0)
MCV: 98 fL (ref 80.0–100.0)
Monocytes Absolute: 1.7 10*3/uL — ABNORMAL HIGH (ref 0.1–1.0)
Monocytes Relative: 15 %
Neutro Abs: 6.7 10*3/uL (ref 1.7–7.7)
Neutrophils Relative %: 63 %
Platelets: 210 10*3/uL (ref 150–400)
RBC: 2.5 MIL/uL — ABNORMAL LOW (ref 3.87–5.11)
RDW: 17.6 % — ABNORMAL HIGH (ref 11.5–15.5)
WBC: 10.7 10*3/uL — ABNORMAL HIGH (ref 4.0–10.5)
nRBC: 0 % (ref 0.0–0.2)

## 2021-10-28 LAB — MAGNESIUM: Magnesium: 2.3 mg/dL (ref 1.7–2.4)

## 2021-10-28 LAB — DRUG SCREEN 10 W/CONF, SERUM
Amphetamines, IA: NEGATIVE ng/mL
Barbiturates, IA: NEGATIVE ug/mL
Benzodiazepines, IA: POSITIVE ng/mL — AB
Cocaine & Metabolite, IA: NEGATIVE ng/mL
Methadone, IA: NEGATIVE ng/mL
Opiates, IA: NEGATIVE ng/mL
Oxycodones, IA: NEGATIVE ng/mL
Phencyclidine, IA: NEGATIVE ng/mL
Propoxyphene, IA: NEGATIVE ng/mL
THC(Marijuana) Metabolite, IA: NEGATIVE ng/mL

## 2021-10-28 LAB — COMPREHENSIVE METABOLIC PANEL
ALT: 26 U/L (ref 0–44)
AST: 32 U/L (ref 15–41)
Albumin: 2.6 g/dL — ABNORMAL LOW (ref 3.5–5.0)
Alkaline Phosphatase: 149 U/L — ABNORMAL HIGH (ref 38–126)
Anion gap: 14 (ref 5–15)
BUN: 63 mg/dL — ABNORMAL HIGH (ref 6–20)
CO2: 24 mmol/L (ref 22–32)
Calcium: 8.7 mg/dL — ABNORMAL LOW (ref 8.9–10.3)
Chloride: 93 mmol/L — ABNORMAL LOW (ref 98–111)
Creatinine, Ser: 4.94 mg/dL — ABNORMAL HIGH (ref 0.44–1.00)
GFR, Estimated: 10 mL/min — ABNORMAL LOW (ref >60)
Glucose, Bld: 164 mg/dL — ABNORMAL HIGH (ref 70–99)
Potassium: 3.7 mmol/L (ref 3.5–5.1)
Sodium: 131 mmol/L — ABNORMAL LOW (ref 135–145)
Total Bilirubin: 0.8 mg/dL (ref 0.3–1.2)
Total Protein: 6.5 g/dL (ref 6.5–8.1)

## 2021-10-28 LAB — GLUCOSE, CAPILLARY
Glucose-Capillary: 109 mg/dL — ABNORMAL HIGH (ref 70–99)
Glucose-Capillary: 117 mg/dL — ABNORMAL HIGH (ref 70–99)
Glucose-Capillary: 142 mg/dL — ABNORMAL HIGH (ref 70–99)
Glucose-Capillary: 146 mg/dL — ABNORMAL HIGH (ref 70–99)
Glucose-Capillary: 147 mg/dL — ABNORMAL HIGH (ref 70–99)
Glucose-Capillary: 172 mg/dL — ABNORMAL HIGH (ref 70–99)
Glucose-Capillary: 177 mg/dL — ABNORMAL HIGH (ref 70–99)

## 2021-10-28 LAB — IRON AND TIBC
Iron: 27 ug/dL — ABNORMAL LOW (ref 28–170)
Saturation Ratios: 11 % (ref 10.4–31.8)
TIBC: 246 ug/dL — ABNORMAL LOW (ref 250–450)
UIBC: 219 ug/dL

## 2021-10-28 LAB — PHOSPHORUS: Phosphorus: 5 mg/dL — ABNORMAL HIGH (ref 2.5–4.6)

## 2021-10-28 LAB — FERRITIN: Ferritin: 715 ng/mL — ABNORMAL HIGH (ref 11–307)

## 2021-10-28 IMAGING — DX DG CHEST 1V PORT
1 series · 1 of 1 positions shown · non-contrast
Comparison: [DATE]

CLINICAL DATA: Shortness of breath. Acute respiratory failure.
Endotracheally intubated.

EXAM:
PORTABLE CHEST 1 VIEW

[chest]
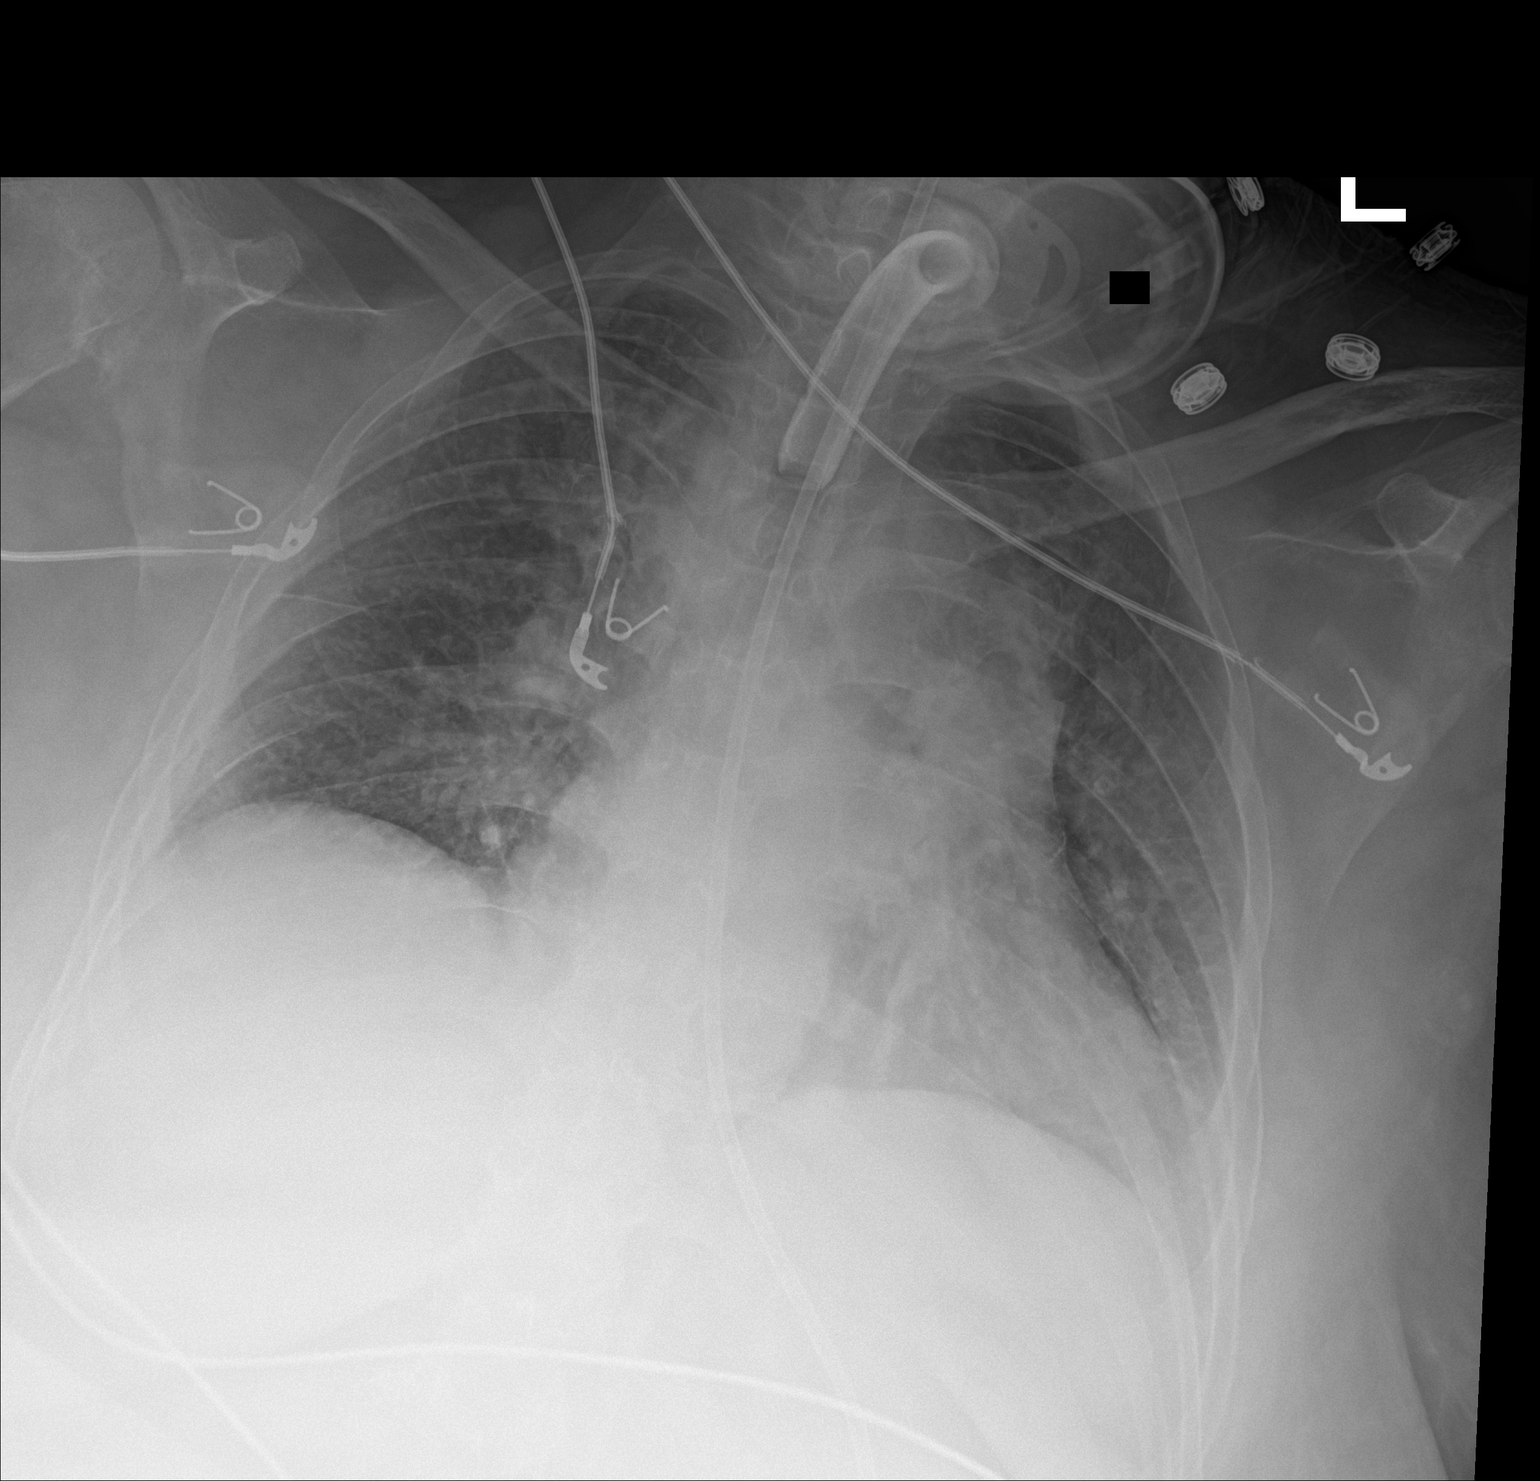

[1 of 1 positions shown; findings below may reference images not displayed]

FINDINGS: Tracheostomy tube and feeding tube remain in place. Low lung volumes
again noted. Heart size is stable. No evidence of pulmonary
consolidation or pleural effusion.
IMPRESSION: Low lung volumes. No acute findings.

## 2021-10-28 NOTE — Procedures (Signed)
Pt arrived in stable condition, is not alert nor oriented.  She does however withdraw from pain.  BP upon arrival 86/73 however at initiation of treatment BP increased to 106/47, her vitals remained stable throughout treatment.  BP at the end of treatment was 130/94 we were able to remove 2373 mls UF.  She was cannulated and decannulated easily.  No complications were noted. ?

## 2021-10-28 NOTE — Progress Notes (Signed)
?PROGRESS NOTE ? ? ? ?Melanie Hall  JQZ:[id number] DOB: July 22, 1970 DOA: 09/11/2021 ?PCP: Renee Rival, FNP  ? ?Brief Narrative:  ?The patient is a 51 year old former smoker with past medical history significant for but not limited to history of ESRD on hemodialysis, CHF, anemia, osteoarthritis, history of CAD, history of diabetes mellitus type 2, hypertension, hyperlipidemia, history of CVA as well as other comorbidities who was found at her home by her patient transport driver to be unresponsive.  Patient's neighbor noted that she was tapping her hands for about a minute and then stop.  She was snoring and drooling and had no improvement after Narcan was given.  She is noted to have a normal CBG and blood pressure was 215/96 on admission.  She was intubated in a[facility]m. pain hospital for airway protection, given a Keppra load and started on dipper Van and started on antibiotics.  Head CT was negative for any acute findings and she was treated for hyperkalemia.  She did similar episode on 09/24/2021 when EMS was called to the house but the episode resolved and patient declined hospital evaluation.  She had no prior history of seizures or known head injury and the ED team discussed with neurology and they advised the patient to be transferred to [facility] for EEG monitoring and further neurology assessment. ? ?Current significant hospital events are as below:  ?Significant Hospital Events: ?Including procedures, antibiotic start and stop dates in addition to other pertinent events   ?3/29 present to [facility], intubated, transfer to [facility] ?3/30 MRI Brain consistent with PRES ?4/3 off sedation, not waking up ?4/4 transitioned off insulin gtt to SSI ?4/5 [place] conversation; see IPAL note ?4/8 MRI with no acute findings ?4/11 trial of extubation failed, she was reintubated, bedside tracheostomy was attempted but failed ?4/12 CT neck was done, negative for acute findings ?4/15-tracheostomy, performed by ENT ?4/16 trach  collar ?4/18: TCT overnight and yesterday; added seroquel and clonidine taper; weaned off precedex at 5pm ?4/19: remains on TCT >48 hours  ?4/20: Transferred to [place] and underwent Dialysis again.  ?4/21: Has not changed with her mental status and had some bloody secretions from her trachea.  Palliative care has been consulted given that she is unresponsive.  Continues to be on 5 L and on Cortrak.  We will need to discuss about PEG tube options of SNF placement however there are several barriers given patient's insurance and no dialysis centers that take trachs ?  ?Assessment and Plan: ? ?Pres syndrome with associated status epilepticus ?Acute metabolic encephalopathy secondary to PRES ?-Last MRI did show resolution of findings consistent with press ?-precedex off 4/18 ?-seroquel and clonidine taper started 4/18 ?-cont vimpat and provigil per neuro for seizure ppx ?-cont gabapentin and seroquel  ?-C/w IV Ativan 2 mg for clinical Seziure-like activity ?-frequent neurochecks; seizure precaution in place ?-Continues to be significantly encephalopathic and was not responsive to examination again ?-Neurology following and she was getting Provigil to promote wakefulness ?-Palliative Care consulted for [place] Discussions and she is now DNR. Daughter would allow for time to see if patient improves and Discussions about PEG still need to be had  ?  ?Acute respiratory failure secondary to probable aspiration pneumonia S/p tracheostomy because of Prolonged Vent Dependence due to Encephalopathy ?-Continue trach collar as tolerated; has been on for >48 hours ?-prn tracheal suction ?-trach care per protocol routine trach care; RRT discussed about patient's trach sutures and the ability to change the trach gauze and clean around the  stoma but pulmonary prefers for the RT to wait until the weekend to remove the trach sutures ?-Recommending change to cuffless trach per ENT recommendations ?-SpO2: 94 % ?O2 Flow Rate (L/min): 5  L/min ?FiO2 (%): 28 % ?-Continue supplemental oxygen via ATC to maintain O2 saturations greater than 90% ?-Continue supportive care: Palliative care consulted for goals of care discussion ?-She has had a Cortrak in place and will need to change out to either a PEG and will have further goals of care discussion the patient's family ? ?End stage renal disease on hemodialysis Tuesday Thursday Saturday ?Hyponatremia ?-Nephro following ?-cont iHD and she underwent Dialysis today  ?-Trend BMP / urinary output ?-Replace electrolytes as indicated ?-Patient's BUNs/creatinine is now 63/4.94 ?-Sodium went from 133 -> 134 -> 131 ?-Avoid nephrotoxic agents, contrast dyes, ensure adequate renal perfusion to prevent dehydration and hypoperfusion and renally dose medications ?-Repeat CMP in a.m. ?  ?Anemia of chronic disease ?-Patient's hemoglobin/hematocrit is now 8.2/24.5 with an MCV of 98.0 ?-Checked Anemia Panel and showed an iron level of 27, U IBC of 290, TIBC of 246, saturation ratio of 11%, ferritin of 715 ?-Continue monitor for signs and symptoms of bleeding; no overt bleeding noted but she did have some blood on trach ?-Repeat CBC in a.m. ? ?HFrEF ?Coronary artery disease/hyperlipidemia ?-BP soft overnight since starting clonidine taper ?-will decrease dose of hydralazine ?-cont carvedilol and clonidine ?-cont ASA and statin ?-cont zetia ?-Continue volume maintenance with hemodialysis ?  ?Fever, improved  ?-Unclear etiology but could be a drug fever ?-Had a Tmax of 101.3 a few days ago  ?-Check blood cultures x2 and showed NGTD at 24 Hours  ?-Chest x-ray shows improved aeration and this AM it showed "Tracheostomy tube and feeding tube remain in place. Low lung volumes again noted. Heart size is stable. No evidence of pulmonary consolidation or pleural effusion." ? -Check urinalysis ?-If continues to Spike temperatures may consult ID for further evaluation and obtain a TTE ?  ?Leukocytosis ?-WBC went from 10.3 -> 11.7 ->  10.6 -> 10.7 ?-Continue to monitor for signs and symptoms of infection ?-Repeat CBC in the AM  ? ?Type 2 diabetes with hyperglycemia ?-cont SSI and cbg monitoring ?-cont basal and TF coverage ?-CBGs ranging from 109-147 ? ?Hypothyroidism ?-Continue with her home Levothyroxine 50 mcg per Tube ? ?Unstageable sacral ulcer ?-wound care ?Pressure Injury 09/09/2021 Toe (Comment  which one) Anterior;Right Deep Tissue Pressure Injury - Purple or maroon localized area of discolored intact skin or blood-filled blister due to damage of underlying soft tissue from pressure and/or shear. (Active)  ?09/15/2021 1551  ?Location: Toe (Comment  which one)  ?Location Orientation: Anterior;Right  ?Staging: Deep Tissue Pressure Injury - Purple or maroon localized area of discolored intact skin or blood-filled blister due to damage of underlying soft tissue from pressure and/or shear.  ?Wound Description (Comments):   ?Present on Admission: Yes  ?   ?Pressure Injury 10/06/2021 Sacrum Medial Stage 1 -  Intact skin with non-blanchable redness of a localized area usually over a bony prominence. (Active)  ?09/23/2021 1553  ?Location: Sacrum  ?Location Orientation: Medial  ?Staging: Stage 1 -  Intact skin with non-blanchable redness of a localized area usually over a bony prominence.  ?Wound Description (Comments):   ?Present on Admission: Yes  ?   ?Pressure Injury Posterior;Right Deep Tissue Pressure Injury - Purple or maroon localized area of discolored intact skin or blood-filled blister due to damage of underlying soft tissue from pressure and/or shear. purple (  Active)  ?   ?Location:   ?Location Orientation: Posterior;Right  ?Staging: Deep Tissue Pressure Injury - Purple or maroon localized area of discolored intact skin or blood-filled blister due to damage of underlying soft tissue from pressure and/or shear.  ?Wound Description (Comments): purple  ?Present on Admission:   ?   ?Pressure Injury 10/16/21 Anus Upper Stage 3 -  Full thickness  tissue loss. Subcutaneous fat may be visible but bone, tendon or muscle are NOT exposed. brown slough (Active)  ?10/16/21 1802  ?Location: Anus  ?Location Orientation: Upper  ?Staging: Stage 3 -  Full thickness tissue

## 2021-10-28 NOTE — Progress Notes (Signed)
?[facility] ?Progress Note  ? ?Subjective:  Patient seen and examined at bedside during HD.  Soft restraints placed on L hand d/t safety concerns while patient is frequently moving around and potential to dislodge HD needles.  Remains non responsive.  ? ?Objective ?Vitals:  ? 10/28/21 0759 10/28/21 0830 10/28/21 0900 10/28/21 0930  ?BP: (!) 106/47 (!) 110/53 (!) 115/48 (!) 122/57  ?Pulse:  90 91 96  ?Resp: (!) 21 (!) 23 (!) 22 19  ?Temp: 98 ?F (36.7 ?C)     ?TempSrc:      ?SpO2:    100%  ?Weight:      ?Height:      ? ?Physical Exam ?General:chronically ill appearing female in NAD ?Heart: RRR, no mrg ?Lungs:CTAB, trach to O2 ?Abdomen:soft, NTND, NG tube in place ?Extremities:trace LE edema ?Dialysis Access: LU AVF in use  ? ?Filed Weights  ? 10/24/21 1700 10/26/21 1017 10/26/21 1400  ?Weight: 82.1 kg 90 kg 88.3 kg  ? ? ?Intake/Output Summary (Last 24 hours) at 10/28/2021 0950 ?Last data filed at 10/27/2021 1500 ?Gross per 24 hour  ?Intake 780 ml  ?Output --  ?Net 780 ml  ? ? ?Additional Objective ?Labs: ?Basic Metabolic Panel: ?Recent Labs  ?Lab 10/27/21 ?4709 10/27/21 ?6283 10/28/21 ?0251  ?NA 133* 134* 131*  ?K 3.2* 4.6 3.7  ?CL 97* 96* 93*  ?CO2 '26 26 24  ' ?GLUCOSE 182* 108* 164*  ?BUN 34* 40* 63*  ?CREATININE 3.17* 3.60* 4.94*  ?CALCIUM 8.4* 8.7* 8.7*  ?PHOS 3.4 4.5 5.0*  ? ?Liver Function Tests: ?Recent Labs  ?Lab 10/27/21 ?6629 10/27/21 ?4765 10/28/21 ?0251  ?AST 30  --  32  ?ALT 25  --  26  ?ALKPHOS 134*  --  149*  ?BILITOT 0.6  --  0.8  ?PROT 6.2*  --  6.5  ?ALBUMIN 2.3* 2.5* 2.6*  ? ?CBC: ?Recent Labs  ?Lab 10/24/21 ?0251 10/25/21 ?0106 10/26/21 ?0159 10/27/21 ?4650 10/28/21 ?0251  ?WBC 10.3 10.3 11.7* 10.6* 10.7*  ?NEUTROABS 6.2 6.7  --  7.1 6.7  ?HGB 7.0* 8.0* 7.6* 8.0* 8.2*  ?HCT 21.1* 23.9* 23.8* 24.2* 24.5*  ?MCV 102.4* 98.0 100.8* 98.4 98.0  ?PLT 280 282 252 226 210  ? ?Blood Culture ?   ?Component Value Date/Time  ? SDES BLOOD RIGHT HAND 10/27/2021 1038  ? SPECREQUEST  10/27/2021 1038  ?   BOTTLES DRAWN AEROBIC AND ANAEROBIC Blood Culture adequate volume  ? CULT  10/27/2021 1038  ?  NO GROWTH < 24 HOURS ?Performed at [facility], [place] [address], [place], [place] [address] ?  ? REPTSTATUS PENDING 10/27/2021 1038  ? ? ?CBG: ?Recent Labs  ?Lab 10/27/21 ?1556 10/27/21 ?2011 10/27/21 ?2329 10/28/21 ?0345 10/28/21 ?0720  ?GLUCAP 128* 108* 150* 177* 172*  ? ?Studies/Results: ?DG CHEST PORT 1 VIEW ? ?Result Date: 10/28/2021 ?CLINICAL DATA:  Shortness of breath. Acute respiratory failure. Endotracheally intubated. EXAM: PORTABLE CHEST 1 VIEW COMPARISON:  10/27/2021 FINDINGS: Tracheostomy tube and feeding tube remain in place. Low lung volumes again noted. Heart size is stable. No evidence of pulmonary consolidation or pleural effusion. IMPRESSION: Low lung volumes. No acute findings. Electronically Signed   By: Marlaine Hind M.D.   On: 10/28/2021 08:30  ? ?DG CHEST PORT 1 VIEW ? ?Result Date: 10/27/2021 ?CLINICAL DATA:  51 year old female with history of stroke EXAM: PORTABLE CHEST - 1 VIEW COMPARISON:  10/20/2021 FINDINGS: The mediastinal contours are within normal limits. Unchanged cardiomegaly. Interval removal of endotracheal tube and creation  of tracheostomy, with tracheostomy cannula tip in the mid superior thoracic trachea. Enteric feeding tube in place coursing off the inferior aspect of this image. The lungs are clear bilaterally, improved from comparison, without evidence of focal consolidation, pleural effusion, or pneumothorax. No acute osseous abnormality. IMPRESSION: 1. Interval tracheostomy which appears well positioned. 2. Improved aeration of the lungs bilaterally. Electronically Signed   By: Ruthann Cancer M.D.   On: 10/27/2021 08:07   ? ?Medications: ? sodium chloride Stopped (10/29/2021 2020)  ? feeding supplement (NEPRO CARB STEADY) 45 mL/hr at 10/27/21 0700  ? ferric gluconate (FERRLECIT) IVPB Stopped (10/26/21 1514)  ? ? aspirin  81 mg Per Tube Daily  ? atorvastatin  80 mg Per Tube  q1800  ? calcitRIOL  0.25 mcg Per Tube Q T,Th,Sat-1800  ? carvedilol  6.25 mg Per Tube BID WC  ? chlorhexidine gluconate (MEDLINE KIT)  15 mL Mouth Rinse BID  ? Chlorhexidine Gluconate Cloth  6 each Topical Q0600  ? Chlorhexidine Gluconate Cloth  6 each Topical Q0600  ? cholestyramine  4 g Per Tube TID  ? darbepoetin (ARANESP) injection - DIALYSIS  150 mcg Intravenous Q Thu-HD  ? ezetimibe  10 mg Per Tube Daily  ? feeding supplement (PROSource TF)  45 mL Per Tube TID  ? fiber  1 packet Per Tube TID  ? folic acid  1 mg Per Tube Daily  ? gabapentin  300 mg Per Tube Q8H  ? heparin injection (subcutaneous)  5,000 Units Subcutaneous Q8H  ? hydrALAZINE  50 mg Per Tube BID  ? insulin aspart  0-15 Units Subcutaneous Q4H  ? insulin aspart  5 Units Subcutaneous Q4H  ? insulin detemir  15 Units Subcutaneous BID  ? lacosamide  50 mg Per Tube BID  ? leptospermum manuka honey  1 application. Topical UD  ? levothyroxine  50 mcg Per Tube QAC breakfast  ? mouth rinse  15 mL Mouth Rinse 10 times per day  ? multivitamin  1 tablet Per Tube QHS  ? pantoprazole sodium  40 mg Per Tube Daily  ? QUEtiapine  50 mg Per Tube BID  ? thiamine  100 mg Per Tube Daily  ? vitamin B-12  1,000 mcg Per Tube Daily  ? ? ?Dialysis Orders: ?TTS [facility] ?  3h 33mn  75.5kg  2/2.5 bath  400 bfr   Hep none LUE AVF ? - 4/14 > hep B Ag negative and hep B Ab's low/ not protective ? - mircera 200 q2 wks ? - venofer 50 weekly ? - rocaltrol 0.25 tiw ?  ?Assessment/Plan: ?AMS: Found unresponsive at home. Had PRES and seizures, seen by neurology. Repeat MRI improved but mental status remains poor.  Sz meds lowered and getting provigil to promote wakefulness. Per neuro/ pmd. Palliative trying to set up [employer] with family.  ?Resp failure: Initially intubated for airway protection, s/p trach 4/15 by ENT. Now off vent, with trach collar O2.  Blood present in trach collar, Respiratory aware.  ?ESRD: Usual TTS schedule - Next HD on 4/25. ?HTN/ Volume: BP in  goal.  Home meds reduced: Carvedilol 225mBID -> 6.2581mID, hydralazine 100m70mD -> 50mg36m. Clonidine 0.1 discontinued. Hoyer wt = 82kg. Not close to dry weight, CXR this AM with low lung volumes but no acute abnormalities.  UF as tolerated. ?T2DM: Insulin per primary. ?Possible sepsis: S/p short course IV abx. Blood Cx 3/29 negative. ?Anemia of ESRD: Hgb^8.2. Continue Aranesp 150mcg88mhusday and weekly IV  Fe here.  ?Secondary hyperparathyroidism: CorrCa/Phos ok. Continue VDRA, no binder for now. ?CAD/ HL: On BB, asa, statin; per pmd.  ?Nutrition: Nepro TF + supplements. ?Unstageable sacral ulcer: Wound care following. ?Dispo: Placement for a patient on dialysis with a trach is exceedingly difficult as everyone is aware. ? ?Jen Mow, PA-C ?[facility] ?10/28/2021,9:50 AM ? LOS: 24 days  ? ? ?

## 2021-10-29 ENCOUNTER — Inpatient Hospital Stay (HOSPITAL_COMMUNITY): Payer: Medicaid Other

## 2021-10-29 DIAGNOSIS — Z515 Encounter for palliative care: Secondary | ICD-10-CM | POA: Diagnosis not present

## 2021-10-29 DIAGNOSIS — R4182 Altered mental status, unspecified: Secondary | ICD-10-CM | POA: Diagnosis not present

## 2021-10-29 DIAGNOSIS — N186 End stage renal disease: Secondary | ICD-10-CM | POA: Diagnosis not present

## 2021-10-29 DIAGNOSIS — I6783 Posterior reversible encephalopathy syndrome: Secondary | ICD-10-CM | POA: Diagnosis not present

## 2021-10-29 DIAGNOSIS — Z7189 Other specified counseling: Secondary | ICD-10-CM | POA: Diagnosis not present

## 2021-10-29 DIAGNOSIS — G9340 Encephalopathy, unspecified: Secondary | ICD-10-CM | POA: Diagnosis not present

## 2021-10-29 LAB — COMPREHENSIVE METABOLIC PANEL
ALT: 25 U/L (ref 0–44)
AST: 35 U/L (ref 15–41)
Albumin: 2.4 g/dL — ABNORMAL LOW (ref 3.5–5.0)
Alkaline Phosphatase: 133 U/L — ABNORMAL HIGH (ref 38–126)
Anion gap: 11 (ref 5–15)
BUN: 41 mg/dL — ABNORMAL HIGH (ref 6–20)
CO2: 25 mmol/L (ref 22–32)
Calcium: 8.4 mg/dL — ABNORMAL LOW (ref 8.9–10.3)
Chloride: 98 mmol/L (ref 98–111)
Creatinine, Ser: 3.67 mg/dL — ABNORMAL HIGH (ref 0.44–1.00)
GFR, Estimated: 14 mL/min — ABNORMAL LOW (ref >60)
Glucose, Bld: 133 mg/dL — ABNORMAL HIGH (ref 70–99)
Potassium: 3 mmol/L — ABNORMAL LOW (ref 3.5–5.1)
Sodium: 134 mmol/L — ABNORMAL LOW (ref 135–145)
Total Bilirubin: 0.4 mg/dL (ref 0.3–1.2)
Total Protein: 5.9 g/dL — ABNORMAL LOW (ref 6.5–8.1)

## 2021-10-29 LAB — GLUCOSE, CAPILLARY
Glucose-Capillary: 124 mg/dL — ABNORMAL HIGH (ref 70–99)
Glucose-Capillary: 75 mg/dL (ref 70–99)
Glucose-Capillary: 144 mg/dL — ABNORMAL HIGH (ref 70–99)
Glucose-Capillary: 124 mg/dL — ABNORMAL HIGH (ref 70–99)
Glucose-Capillary: 138 mg/dL — ABNORMAL HIGH (ref 70–99)

## 2021-10-29 LAB — PREPARE RBC (CROSSMATCH)

## 2021-10-29 LAB — CBC WITH DIFFERENTIAL/PLATELET
Abs Immature Granulocytes: 0.2 10*3/uL — ABNORMAL HIGH (ref 0.00–0.07)
Basophils Absolute: 0.1 10*3/uL (ref 0.0–0.1)
Basophils Relative: 0 %
Eosinophils Absolute: 0.3 10*3/uL (ref 0.0–0.5)
Eosinophils Relative: 3 %
HCT: 20.6 % — ABNORMAL LOW (ref 36.0–46.0)
Hemoglobin: 6.7 g/dL — CL (ref 12.0–15.0)
Immature Granulocytes: 2 %
Lymphocytes Relative: 16 %
Lymphs Abs: 1.8 10*3/uL (ref 0.7–4.0)
MCH: 32.7 pg (ref 26.0–34.0)
MCHC: 32.5 g/dL (ref 30.0–36.0)
MCV: 100.5 fL — ABNORMAL HIGH (ref 80.0–100.0)
Monocytes Absolute: 2.1 10*3/uL — ABNORMAL HIGH (ref 0.1–1.0)
Monocytes Relative: 18 %
Neutro Abs: 7 10*3/uL (ref 1.7–7.7)
Neutrophils Relative %: 61 %
Platelets: 205 10*3/uL (ref 150–400)
RBC: 2.05 MIL/uL — ABNORMAL LOW (ref 3.87–5.11)
RDW: 17.4 % — ABNORMAL HIGH (ref 11.5–15.5)
WBC: 11.4 10*3/uL — ABNORMAL HIGH (ref 4.0–10.5)
nRBC: 0.2 % (ref 0.0–0.2)

## 2021-10-29 LAB — HEMOGLOBIN AND HEMATOCRIT, BLOOD
HCT: 25.8 % — ABNORMAL LOW (ref 36.0–46.0)
Hemoglobin: 8.5 g/dL — ABNORMAL LOW (ref 12.0–15.0)

## 2021-10-29 LAB — MAGNESIUM: Magnesium: 2 mg/dL (ref 1.7–2.4)

## 2021-10-29 LAB — PHOSPHORUS: Phosphorus: 2.7 mg/dL (ref 2.5–4.6)

## 2021-10-29 IMAGING — CT CT CHEST-ABD-PELV W/O CM
2 of 7 series · 11 of 46 positions shown, 12 images · non-contrast
Comparison: [DATE] CT and prior studies

CLINICAL DATA: 50-year-old female with sepsis.



[Series 3: cap without 1 of 2 · axial · non-contrast · 0.61mm/px · z∈[+1160,+1675]mm · 8 of 125 slices shown, 9 images]
[im 11/125  soft-tissue]
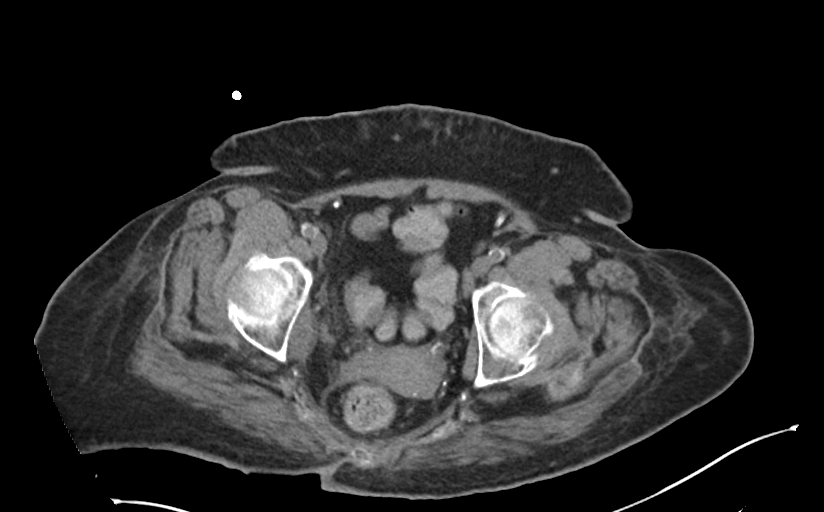
[im 11/125  bone]
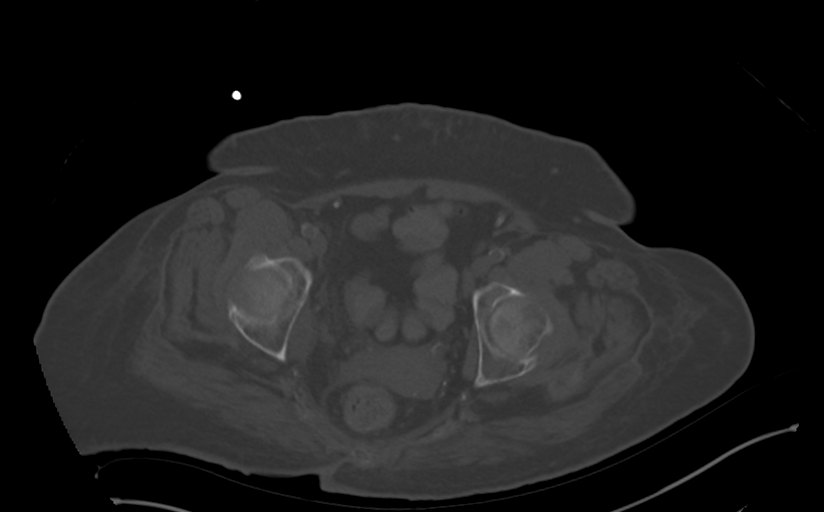
[im 32/125  soft-tissue]
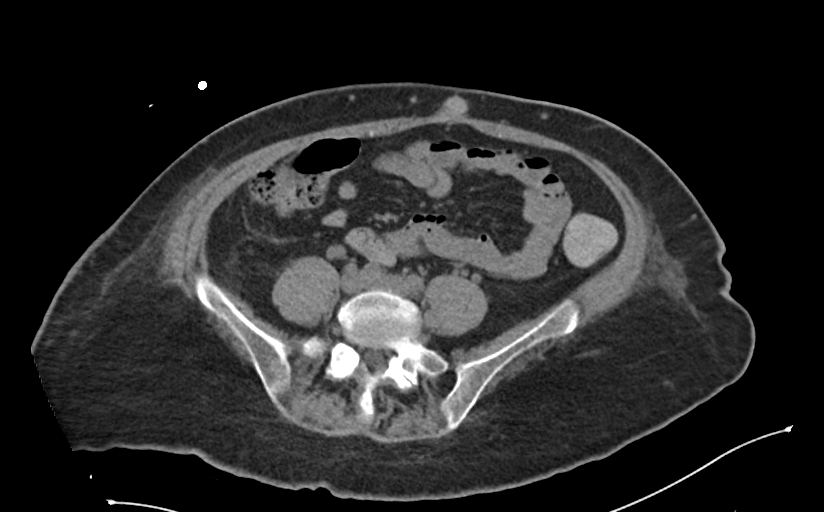
[im 42/125  soft-tissue]
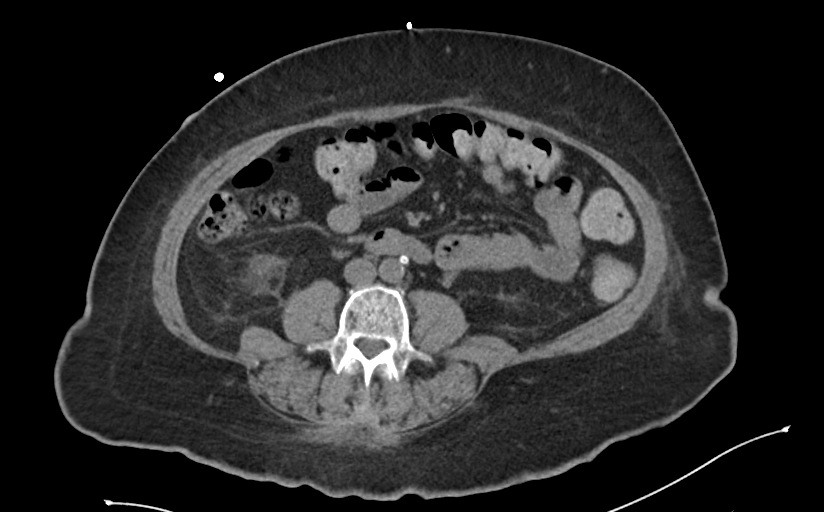
[im 52/125  soft-tissue]
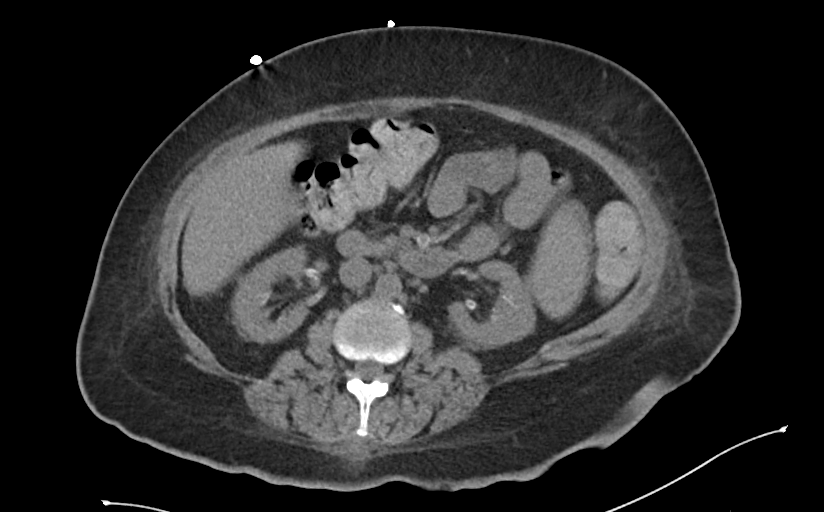
[im 73/125  soft-tissue]
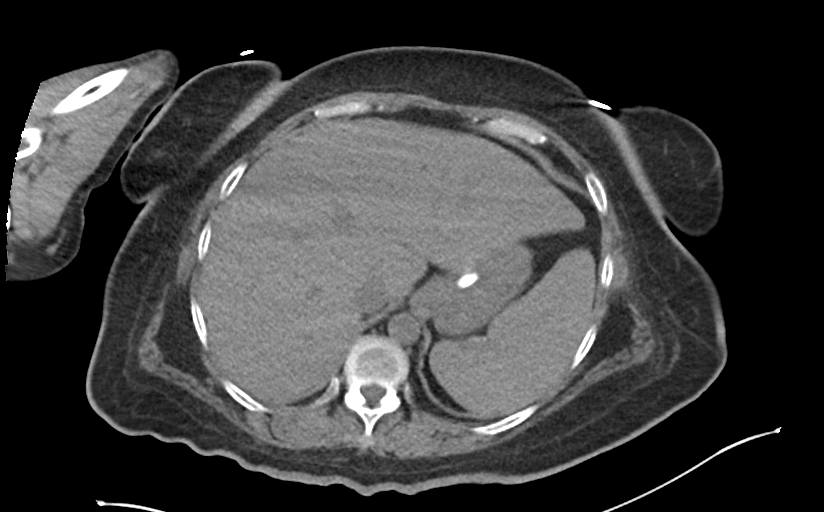
[im 83/125  soft-tissue]
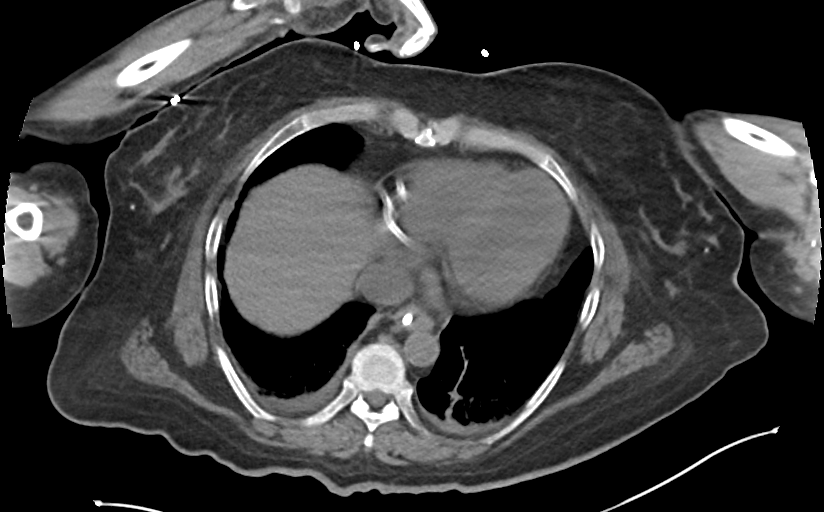
[im 94/125  soft-tissue]
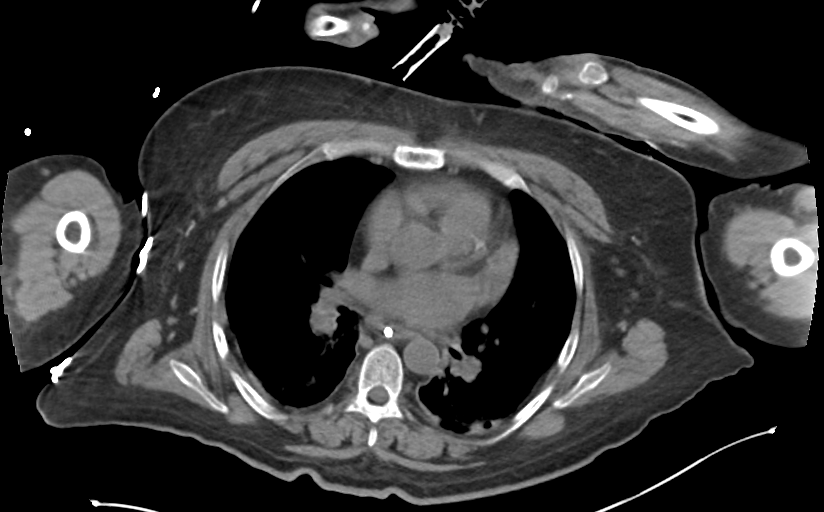
[im 114/125  soft-tissue]
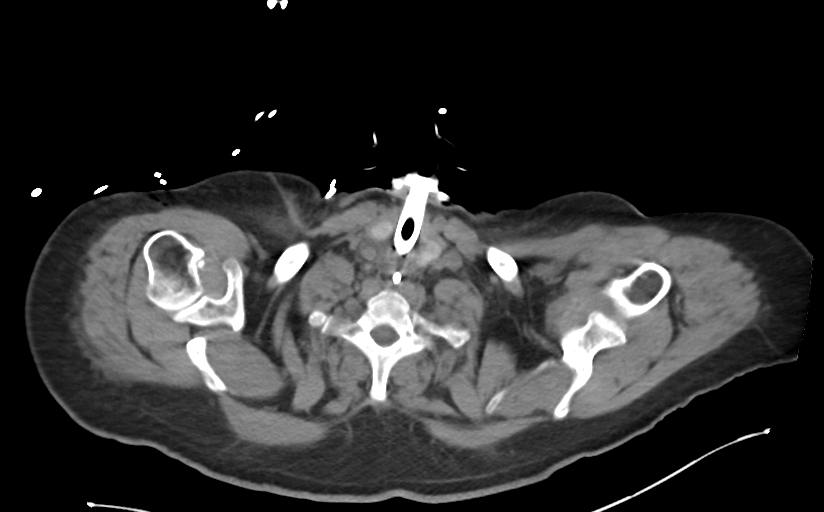

[Series 6: cor 1 of 2 · coronal · 0.98mm/px · 3 of 105 slices shown]
[im 27/105  soft-tissue]
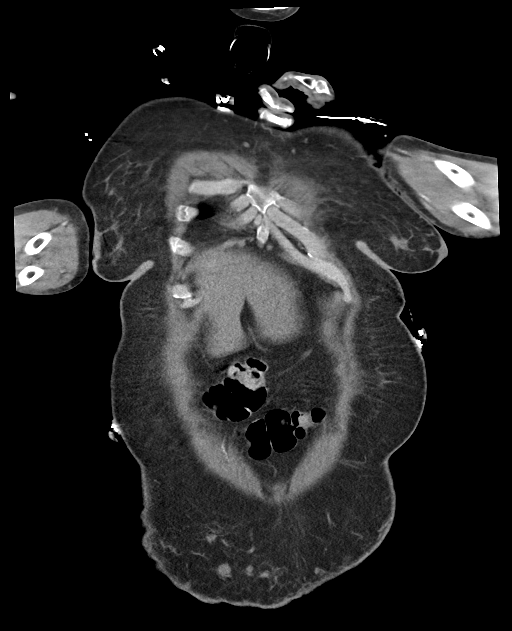
[im 53/105  soft-tissue]
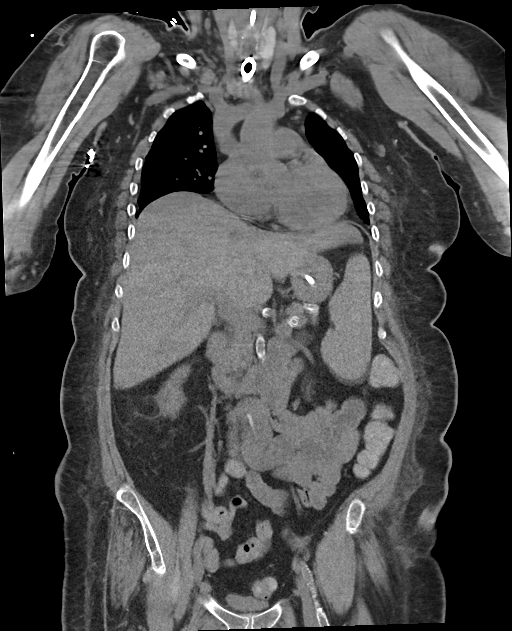
[im 79/105  soft-tissue]
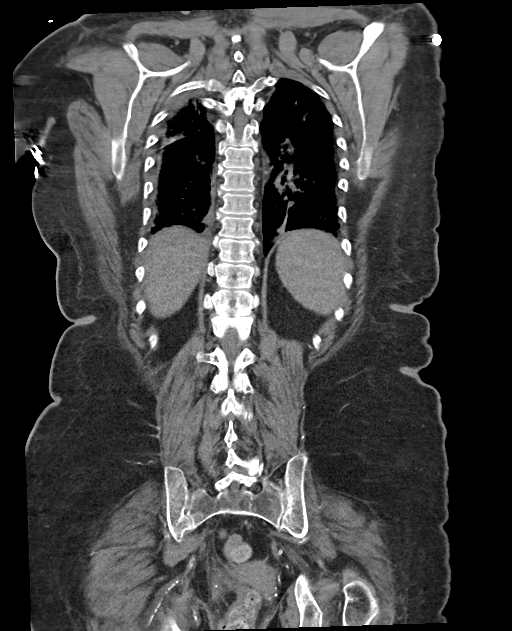

[11 of 46 positions shown; findings below may reference images not displayed]

FINDINGS: Please note that parenchymal and vascular abnormalities may be
missed as intravenous contrast was not administered.

CT CHEST FINDINGS

Cardiovascular: Cardiomegaly and coronary stents again noted. There
is no evidence of thoracic aortic aneurysm or pericardial effusion.

Mediastinum/Nodes: Tracheostomy tube and small bore feeding tube
noted. Visualized thyroid gland and esophagus are otherwise
unremarkable. No mediastinal mass or suspicious lymph nodes.

Lungs/Pleura: Streaky opacities within both posterior RIGHT UPPER
lobe and SUPERIOR segment RIGHT LOWER lobe noted, favor atelectasis
over infection/pneumonia. Mild to moderate bilateral LOWER lobe
atelectasis is also identified. There is no evidence of pulmonary
mass, nodule, pleural effusion or pneumothorax.

Musculoskeletal: No acute abnormality.

CT ABDOMEN PELVIS FINDINGS

Hepatobiliary: No hepatic abnormalities are identified on this
noncontrast study. Cholelithiasis noted without CT evidence of acute
cholecystitis. There is no evidence of intrahepatic or extrahepatic
biliary dilatation.

Pancreas: Unremarkable

Spleen: Splenomegaly is unchanged.

Adrenals/Urinary Tract: Slightly atrophic kidneys again noted. There
is no evidence of hydronephrosis or definite renal mass. The adrenal
glands and bladder are unremarkable.

Stomach/Bowel: A small bore feeding tube is noted with tip in the
distal stomach. There is no evidence of bowel obstruction, definite
bowel wall thickening or bowel inflammatory changes. The appendix is
normal.

Vascular/Lymphatic: Aortic atherosclerosis. Heavy arterial vascular
calcifications are noted. No enlarged abdominal or pelvic lymph
nodes.

Reproductive: Uterus and bilateral adnexa are unremarkable.

Other: No ascites, focal collection or pneumoperitoneum. Scattered
oval densities within the anterior low pelvic subcutaneous fat most
likely represent injection sites.

Musculoskeletal: No acute or suspicious bony abnormalities are
noted. Unchanged chronic L1 vertebral body fracture with sclerosis
and lytic resorption again noted.
IMPRESSION: 1. Streaky opacities within both posterior RIGHT UPPER lobe and
SUPERIOR segment RIGHT LOWER lobe, favor atelectasis over
infection/pneumonia. Mild to moderate bilateral LOWER lobe
atelectasis.
2. No evidence of acute abnormality within the abdomen or pelvis.
3. Cardiomegaly and coronary artery disease.
4. Cholelithiasis without CT evidence of acute cholecystitis.
5. Unchanged splenomegaly.
6. Aortic Atherosclerosis ([D1]-[D1]).

## 2021-10-29 MED ORDER — POTASSIUM CHLORIDE 20 MEQ PO PACK
40.0000 meq | PACK | Freq: Once | ORAL | Status: AC
  Administered 2021-10-29: 40 meq
  Filled 2021-10-29: qty 2

## 2021-10-29 MED ORDER — SODIUM CHLORIDE 0.9% IV SOLUTION: Freq: Once | INTRAVENOUS | Status: AC

## 2021-10-29 NOTE — Progress Notes (Signed)
TRH night cross cover note: ? ?I was notified by RN with patient's updated hemoglobin of 6.7 per this morning's labs. ? ?Per my chart review, including review of most recent rounding hospitalist progress note, patient with end-stage renal disease on hemodialysis on Tuesday, Thursday, Saturday, complicated by anemia chronic kidney disease with most recent prior hemoglobin 8.2 on the morning of 10/28/2021. ? ?Vital signs appear stable: Most recent blood pressure 107/59, heart rate 94.  ? ?As the patient underwent to most recent HD session yesterday, 10/28/2021, and is not due for next HD session until Tuesday, 10/31/2021, I have ordered transfusion of 1 unit PRBC slowly, over 4 hours, with repeat hemoglobin to be rechecked at 9 AM. ? ? ? ?Babs Bertin, DO ?Hospitalist ? ?

## 2021-10-29 NOTE — Progress Notes (Signed)
? ?Palliative Medicine Inpatient Follow Up Note ? ?HPI: ?The patient is a 51 year old former smoker with past medical history significant for but not limited to history of ESRD on hemodialysis, CHF, anemia, osteoarthritis, history of CAD, history of diabetes mellitus type 2, hypertension, hyperlipidemia, history of CVA as well as other comorbidities who was found at her home by her patient transport driver to be unresponsive.  Patient's neighbor noted that she was tapping her hands for about a minute and then stop.  She was snoring and drooling and had no improvement after Narcan was given.  She is noted to have a normal CBG and blood pressure was 215/96 on admission.  She was intubated in a[facility]m. pain hospital for airway protection, given a Keppra load and started on dipper Van and started on antibiotics.  Head CT was negative for any acute findings and she was treated for hyperkalemia.  She did similar episode on 09/24/2021 when EMS was called to the house but the episode resolved and patient declined hospital evaluation.  She had no prior history of seizures or known head injury and the ED team discussed with neurology and they advised the patient to be transferred to [facility] for EEG monitoring and further neurology assessment. ?  ?Palliative care has been asked to get involved to have further goals of care conversations.  ? ?Today's Discussion (10/29/2021): ? ?*Please note that this is a verbal dictation therefore any spelling or grammatical errors are due to the "[employer]" system interpretation. ? ?Chart reviewed inclusive of vital signs, progress notes, laboratory results, and diagnostic images. (+) Fever, (+) Hgb/Hct drop.  ? ?I met with Melanie Hall at bedside this morning. She is not verbal or following commands at this time.  ? ?Per secure chat with speech therapy, Melanie Hall did not pass her swallow evaluation.  ? ?I called patients daughter, Melanie Hall this morning. I shared there since yesterday there have  been some clinical changes. Melanie Hall has had a fever and now has a worsening anemia. We discussed that the primary medical team is working this up further. We reviewed that [facility] had similar issues in the ICU which were treated. ? ?We reviewed that Melanie Hall did not pass her swallow evaluation. Melanie Hall is aware that Melanie Hall will likely need a long term tube her her stomach for feeding. This is something she was anticipating and had been discussed in the ICU.  ? ?Discussed Melanie Hall's ongoing encephalopathy. I shared my concern over the thought of time and reviewed that I have requested Dr. Jackolyn Confer to weigh in. Melanie Hall asked why this input is needed and I shared that it is important to better understand and inform family in terms of where we are at in patients disease process and what we should anticipate moving forward.  ? ?Created space and opportunity for patient to explore thoughts feelings and fears regarding current medical situation. Melanie Hall got very emotional over the phone and expressed that this information was delivered with a lack of empathy. She shares that he mother is chronically ill and she is very much aware of this. She asked to please be aware that she is having a very hard time in terms of coping with her mothers illness trajectory. Melanie Hall vocalizes the desire to have gentle empathetic communication moving forward.  ? ?I shared the role of Palliative care is to education and to advocate for what patients would want. I reviewed that in order for decisions to be made it is important patients and their families to  have any and all pieces of information to make choices. We reviewed that the point of a formal meeting would be for information and in no way is our desire to push decisions in one direction or the other but for everyone to be well informed. Melanie Hall understands this and is in agreement with having a conversation with the other healthcare providers about Melanie Hall's conditions and what to anticipate moving  forward., she shares that her sisters are welcome to be involved.  ? ?Right now, Melanie Hall remains hopeful though is aware of her mother serious illness trajectory.  ? ?Questions and concerns addressed  ? ?Palliative Support Provided  ? ?Objective Assessment: ?Vital Signs ?Vitals:  ? 10/29/21 0752 10/29/21 0820  ?BP: 125/60 (!) 128/55  ?Pulse: 98 98  ?Resp: (!) 23   ?Temp: 98.5 ?F (36.9 ?C)   ?SpO2: 99%   ? ? ?Intake/Output Summary (Last 24 hours) at 10/29/2021 1122 ?Last data filed at 10/29/2021 0800 ?Gross per 24 hour  ?Intake 740 ml  ?Output 2373 ml  ?Net -1633 ml  ? ?Last Weight  Most recent update: 10/26/2021  4:25 PM  ? ? Weight  ?88.3 kg (194 lb 10.7 oz)  ?      ? ?  ? ?Gen: Middle aged 51 female in no acute distress ?HEENT: Dry mucous membranes, Coretrack in place, tracheostomy ?CV: Irregular rate and rhythm ?PULM: Trach collar 5LPM, breathing even and unlabored ?ABD: soft/nontender ?EXT: Generalized edema ?Neuro: Disoriented ? ?SUMMARY OF RECOMMENDATIONS   ?DNAR ? ?Please approach Melanie Hall with empathy in communication as she expresses that she is struggling with the complexity of her medical illness ?  ?Patients daughter would like to allow more time to see if patient improvements can be made (Three months total) ? ?Plan for an information meeting with patients daughter, primary medical team, and neurology ? ?Patients daughter was anticipating the need for Peg and would be in agreement with this ?  ?Ongoing conversations regarding goals of care in the oncoming days ? ?MDM - High ?______________________________________________________________________________________ ?Tacey Ruiz ?[facility] Team ?Team Cell Phone: [phone] ?Please utilize secure chat with additional questions, if there is no response within 30 minutes please call the above phone number ? ?Palliative Medicine Team providers are available by phone from 7am to 7pm daily and can be reached through the team cell  phone.  ?Should this patient require assistance outside of these hours, please call the patient's attending physician. ? ? ? ? ?

## 2021-10-29 NOTE — Progress Notes (Signed)
?   10/29/21 0001  ?Assess: MEWS Score  ?Temp (!) 102.9 ?F (39.4 ?C)  ?BP 136/79  ?Pulse Rate (!) 102  ?ECG Heart Rate (!) 104  ?Resp 20  ?Level of Consciousness Responds to Pain  ?SpO2 97 %  ?O2 Device Tracheostomy Collar  ?Patient Activity (if Appropriate) In bed  ?O2 Flow Rate (L/min) 5 L/min  ?Assess: MEWS Score  ?MEWS Temp 2  ?MEWS Systolic 0  ?MEWS Pulse 1  ?MEWS RR 0  ?MEWS LOC 2  ?MEWS Score 5  ?MEWS Score Color Red  ?Assess: if the MEWS score is Yellow or Red  ?Were vital signs taken at a resting state? Yes  ?Focused Assessment No change from prior assessment  ?Early Detection of Sepsis Score *See Row Information* High  ?MEWS guidelines implemented *See Row Information* Yes  ?Treat  ?MEWS Interventions Administered prn meds/treatments  ?Pain Scale PAINAD  ?Pain Score 0  ?Take Vital Signs  ?Increase Vital Sign Frequency  Red: Q 1hr X 4 then Q 4hr X 4, if remains red, continue Q 4hrs  ?Escalate  ?MEWS: Escalate Red: discuss with charge nurse/RN and provider, consider discussing with RRT  ?Notify: Charge Nurse/RN  ?Name of Charge Nurse/RN Notified Ventura Bruns  ?Date Charge Nurse/RN Notified 10/29/21  ?Time Charge Nurse/RN Notified 0008  ?Notify: Provider  ?Provider Name/Title Dr. Velia Meyer  ?Date Provider Notified 10/29/21  ?Time Provider Notified [phone]  ?Notification Type Page  ?Notification Reason Other (Comment) ?(turned from yellow to red mews, and notified MD of temp of 102.9 and elevated HR)  ?Provider response No new orders  ?Date of Provider Response  ?(no return call from MD)  ?Document  ?Patient Outcome Stabilized after interventions  ?Progress note created (see row info) Yes  ? ? ?

## 2021-10-29 NOTE — Evaluation (Signed)
Clinical/Bedside Swallow Evaluation ?Patient Details  ?Name: Melanie Hall ?MRN: [id number] ?Date of Birth: 04-28-1971 ? ?Today's Date: 10/29/2021 ?Time: SLP Start Time (ACUTE ONLY): 1005 SLP Stop Time (ACUTE ONLY): 1020 ?SLP Time Calculation (min) (ACUTE ONLY): 15 min ? ?Past Medical History:  ?Past Medical History:  ?Diagnosis Date  ? Anemia   ? Arthritis   ? CAD (coronary artery disease)   ? S/p late presenting Inf STEMI 10/2019 >> PCI: DES x 2 to RCA // Myoview 8/22: EF 54, no ischemia or infarction; low risk  ? Cataracts, bilateral   ? surgery to remove  ? CATARACTS, BILATERAL 07/02/2007  ? Qualifier: Diagnosis of  By: Isla Pence    ? Closed fracture of left femur ([place]) 10/28/2018  ? Closed fracture of right ankle 11/06/2017  ? Diabetes mellitus   ? type 2  ? Emphysematous cystitis 08/26/2018  ? ESRD on hemodialysis ([place])   ? MWF - in [place]  ? GERD (gastroesophageal reflux disease)   ? Hyperlipidemia   ? Hypertension   ? IRREGULAR MENSES 09/14/2009  ? Qualifier: Diagnosis of  By: Hassell Done FNP, Tori Milks    ? Ischemic cardiomyopathy   ? Echocardiogram 10/19/2019: EF 40-45, inf WMA, Gr 1 DD, Lg L pl Eff // EF 54 by Myoview in 8/22  ? PARONYCHIA, RIGHT GREAT TOE 07/30/2008  ? Qualifier: Diagnosis of  By: Hassell Done FNP, Tori Milks    ? Pressure ulcer 09/10/2019  ? Right arm weakness 08/08/2019  ? STEMI involving right coronary artery ([place]) 10/18/2019  ? Stroke [facility]) 10/2019  ? SVD (spontaneous vaginal delivery)   ? x 4  ? Vaginosis 08/26/2018  ? Weakness of both lower extremities   ? ?Past Surgical History:  ?Past Surgical History:  ?Procedure Laterality Date  ? A/V FISTULAGRAM Left 01/12/2020  ? Procedure: A/V FISTULAGRAM;  Surgeon: Serafina Mitchell, MD;  Location: [place] [facility];  Service: Cardiovascular;  Laterality: Left;  ? A/V FISTULAGRAM N/A 03/17/2020  ? Procedure: A/V FISTULAGRAM - Left Arm;  Surgeon: Marty Heck, MD;  Location: [place] [facility];  Service: Cardiovascular;  Laterality: N/A;  ? A/V  FISTULAGRAM Left 06/23/2020  ? Procedure: A/V FISTULAGRAM;  Surgeon: Marty Heck, MD;  Location: [place] [facility];  Service: Cardiovascular;  Laterality: Left;  ? ANKLE FUSION Left 06/01/2020  ? Procedure: LEFT ANKLE FUSION;  Surgeon: Newt Minion, MD;  Location: [place];  Service: Orthopedics;  Laterality: Left;  ? AV FISTULA PLACEMENT Left 08/17/2019  ? Procedure: LEFT BRACHIAL CEPHALIC ARTERIOVENOUS (AV) FISTULA;  Surgeon: Angelia Mould, MD;  Location: [place];  Service: Vascular;  Laterality: Left;  ? BIOPSY  02/28/2021  ? Procedure: BIOPSY;  Surgeon: Harvel Quale, MD;  [facility];  Service: Gastroenterology;;  duodenum ?gastric ?esophagus ?colon  ? COLONOSCOPY WITH PROPOFOL N/A 02/28/2021  ? Procedure: COLONOSCOPY WITH PROPOFOL;  Surgeon: Harvel Quale, MD;  [facility];  Service: Gastroenterology;  Laterality: N/A;  11:00  ? CORONARY STENT INTERVENTION N/A 10/18/2019  ? Procedure: CORONARY STENT INTERVENTION;  Surgeon: Sherren Mocha, MD;  Location: [place] [facility];  Service: Cardiovascular;  Laterality: N/A;  ? CORONARY/GRAFT ACUTE MI REVASCULARIZATION N/A 10/18/2019  ? Procedure: Coronary/Graft Acute MI Revascularization;  Surgeon: Sherren Mocha, MD;  Location: [place] [facility];  Service: Cardiovascular;  Laterality: N/A;  ? ESOPHAGOGASTRODUODENOSCOPY (EGD) WITH PROPOFOL N/A 02/28/2021  ? Procedure: ESOPHAGOGASTRODUODENOSCOPY (EGD) WITH PROPOFOL;  Surgeon: Harvel Quale, MD;  [facility];  Service: Gastroenterology;  Laterality: N/A;  ? EYE SURGERY Bilateral   ? cataracts removed  ? FEMUR IM NAIL Left 10/28/2018  ? Procedure: RETROGRADE FEMORAL NAILING;  Surgeon: Meredith Pel, MD;  Location: [place];  Service: Orthopedics;  Laterality: Left;  ? IM NAILING FEMORAL SHAFT RETROGRADE Left 10/28/2018  ? INTRAVASCULAR ULTRASOUND/IVUS N/A 10/18/2019  ? Procedure: Intravascular Ultrasound/IVUS;  Surgeon: Sherren Mocha,  MD;  Location: [place] [facility];  Service: Cardiovascular;  Laterality: N/A;  ? IR FLUORO GUIDE CV LINE RIGHT  08/11/2019  ? IR THORACENTESIS ASP PLEURAL SPACE W/IMG GUIDE  11/12/2019  ? IR US GUIDE VASC ACCESS RIGHT  08/11/2019  ? KNEE SURGERY Left   ? LEFT HEART CATH AND CORONARY ANGIOGRAPHY N/A 10/18/2019  ? Procedure: LEFT HEART CATH AND CORONARY ANGIOGRAPHY;  Surgeon: Sherren Mocha, MD;  Location: [place] [facility];  Service: Cardiovascular;  Laterality: N/A;  ? PERIPHERAL VASCULAR BALLOON ANGIOPLASTY Left 01/12/2020  ? Procedure: PERIPHERAL VASCULAR BALLOON ANGIOPLASTY;  Surgeon: Serafina Mitchell, MD;  Location: [place] [facility];  Service: Cardiovascular;  Laterality: Left;  AVF  ? PERIPHERAL VASCULAR BALLOON ANGIOPLASTY Left 03/17/2020  ? Procedure: PERIPHERAL VASCULAR BALLOON ANGIOPLASTY;  Surgeon: Marty Heck, MD;  Location: [place] [facility];  Service: Cardiovascular;  Laterality: Left;  AVF  ? PERIPHERAL VASCULAR BALLOON ANGIOPLASTY Left 05/05/2020  ? Procedure: PERIPHERAL VASCULAR BALLOON ANGIOPLASTY;  Surgeon: Marty Heck, MD;  Location: [place] [facility];  Service: Cardiovascular;  Laterality: Left;  arm fistula  ? RADIOLOGY WITH ANESTHESIA N/A 09/15/2019  ? Procedure: [facility] AND LUMBER LOWER BACK PAIN;  Surgeon: Radiologist, Medication, MD;  Location: [place];  Service: Radiology;  Laterality: N/A;  ? TRACHEOSTOMY TUBE PLACEMENT N/A 10/07/2021  ? Procedure: TRACHEOSTOMY;  Surgeon: Izora Gala, MD;  Location: [place];  Service: ENT;  Laterality: N/A;  ? TUBAL LIGATION    ? ?HPI:  ?Ms. Coller is a 51 y/o woman with a history of ESRD who presented with hypertensive emergency and PRES on 09/15/2021. She required trach for prolonged ventilation due to ongoing encephalopathy. She has been tolerating TC.  She is poorly responsive -  does not blink to threat, withdraws BUE and turns head to noxious stimuli. A swallowing consult was ordered by Palliative Medicine to determine any potential for PO intake.  Pt currently has cortrak for nutrition.  ?  ?Assessment / Plan / Recommendation  ?Clinical Impression ? Pt was seen for a bedside swallow assessment. RN assisted to reposition Ms. Longanecker in upright position. She continues with #8 Shiley trach with cuff deflated at baseline.  Pt withdrew/turned head away in response to oral care and sealed lips against suctioning.  She did not blink to threat with assisted eye opening.  She showed no recognition of spoon to lips with ice/half teaspoon of water. Material spilled from mouth without awareness. Pt  turned head away from spoon. No spontaneous swallow was observed nor could be elicited with oral care or limited water.  No spontaneous cough observed.  Ms. Humbarger, unfortunately, demonstrates no functional ability to swallow.  She should remain NPO.  If she demonstrates improved responsiveness, please reconsult SLP for swallowing and PMV evaluations. Otherwise, our service will respectfully sign off. ?SLP Visit Diagnosis: Dysphagia, oropharyngeal phase (R13.12) ?   ?Aspiration Risk ? Severe aspiration risk  ?  ?Diet Recommendation   NPO ? ?Medication Administration: Via alternative means  ?  ?Other  Recommendations Oral Care Recommendations: Oral care QID   ? ?  Recommendations for follow up therapy are one component of a multi-disciplinary discharge planning process, led by the attending physician.  Recommendations may be updated based on patient status, additional functional criteria and insurance authorization. ? ?Follow up Recommendations No SLP follow up  ? ? ?  ?Assistance Recommended at Discharge  Constant supervision  ?Functional Status Assessment Patient has had a recent decline in their functional status and/or demonstrates limited ability to make significant improvements in function in a reasonable and predictable amount of time  ?    ?  ?  ?   ? ?Prognosis Prognosis for Safe Diet Advancement: Guarded  ? ?  ? ?Swallow Study   ?General Date of Onset: 09/11/2021 ?HPI: Ms.  Miskell is a 51 y/o woman with a history of ESRD who presented with hypertensive emergency and PRES on 09/15/2021. She required trach for prolonged ventilation due to ongoing encephalopathy. She has been tol

## 2021-10-29 NOTE — Progress Notes (Signed)
?PROGRESS NOTE ? ? ? ?Melanie Hall  NAT:[id number] DOB: [birth year] DOA: 09/08/2021 ?PCP: Renee Rival, FNP  ? ?Brief Narrative:  ?The patient is a 51 year old former smoker with past medical history significant for but not limited to history of ESRD on hemodialysis, CHF, anemia, osteoarthritis, history of CAD, history of diabetes mellitus type 2, hypertension, hyperlipidemia, history of CVA as well as other comorbidities who was found at her home by her patient transport driver to be unresponsive.  Patient's neighbor noted that she was tapping her hands for about a minute and then stop.  She was snoring and drooling and had no improvement after Narcan was given.  She is noted to have a normal CBG and blood pressure was 215/96 on admission.  She was intubated in a.m. pain hospital for airway protection, given a Keppra load and started on dipper Van and started on antibiotics.  Head CT was negative for any acute findings and she was treated for hyperkalemia.  She did similar episode on 09/24/2021 when EMS was called to the house but the episode resolved and patient declined hospital evaluation.  She had no prior history of seizures or known head injury and the ED team discussed with neurology and they advised the patient to be transferred to [facility] for EEG monitoring and further neurology assessment. ? ?Current significant hospital events are as below:  ?Significant Hospital Events: ?Including procedures, antibiotic start and stop dates in addition to other pertinent events   ?3/29 present to [facility], intubated, transfer to [facility] ?3/30 MRI Brain consistent with PRES ?4/3 off sedation, not waking up ?4/4 transitioned off insulin gtt to SSI ?4/5 [place] conversation; see IPAL note ?4/8 MRI with no acute findings ?4/11 trial of extubation failed, she was reintubated, bedside tracheostomy was attempted but failed ?4/12 CT neck was done, negative for acute findings ?4/15-tracheostomy, performed by ENT ?4/16 trach  collar ?4/18: TCT overnight and yesterday; added seroquel and clonidine taper; weaned off precedex at 5pm ?4/19: remains on TCT >48 hours  ?4/20: Transferred to [place] and underwent Dialysis again.  ?4/21: Has not changed with her mental status and had some bloody secretions from her trachea.  Palliative care has been consulted given that she is unresponsive.  Continues to be on 5 L and on Cortrak.  We will need to discuss about PEG tube options of SNF placement however there are several barriers given patient's insurance and no dialysis centers that take trachs ?4/22: Palliative Care Discussions being held and patient made DNR and she underwent Dialysis again; PEG tube discussions will be held further if no improvement in speech therapy but patient daughter will allow more time to see if the patient makes any improvement ?4/23- Blood count dropped to 6.7 so will be transfused 1 unit of pRBCs. Palliative continues conversations and Daughter would pursue PEG. Continues to have Fevers so will pan-scan but neurology suspects that she may have some hypoxic injury is not showing up on MRI and feels that this could be indicative of her low intermittent fevers  ? ?Assessment and Plan: ? ?PRES syndrome with associated status epilepticus ?Acute metabolic encephalopathy secondary to PRES ?-Last MRI did show resolution of findings consistent with press ?-precedex off 4/18 ?-seroquel and clonidine taper started 4/18 ?-cont vimpat and provigil per neuro for seizure ppx ?-cont gabapentin and seroquel  ?-C/w IV Ativan 2 mg for clinical Seziure-like activity ?-frequent neurochecks; seizure precaution in place ?-Continues to be significantly encephalopathic and was not responsive to examination again ?-  Neurology following and she was getting Provigil to promote wakefulness ?-SLP evaluated and recommending NPO given that she demonstrates no functional ability to swallow ?-Palliative Care consulted for [place] Discussions and she is  now DNR. Daughter would allow for time to see if patient improves and Discussions about PEG are happening but Daughter is ok with PEG to be placed; Palliative asking Neurology Dr. Hortense Ramal weigh in again given concern that patient will continue to deteriorate in the long run ?  ?Acute respiratory failure secondary to probable aspiration pneumonia S/p tracheostomy because of Prolonged Vent Dependence due to Encephalopathy ?-Continue trach collar as tolerated; has been on for >48 hours ?-prn tracheal suction ?-trach care per protocol routine trach care; RRT discussed about patient's trach sutures and the ability to change the trach gauze and clean around the stoma but pulmonary prefers for the RT to wait until the weekend to remove the trach sutures ?-Recommending change to cuffless trach per ENT recommendations ?-SpO2: 100 % ?O2 Flow Rate (L/min): 5 L/min ?FiO2 (%): 28 %; Continue supplemental oxygen via ATC to maintain O2 saturations greater than 90% ?-Continue supportive care: Palliative care consulted for goals of care discussion ?-She has had a Cortrak in place and will need to change out to either a PEG and will have further goals of care discussion the patient's family and family likely leaning towards PEG Placement with further GOC discussion ? ?End Stage Renal Disease on hemodialysis Tuesday Thursday Saturday ?Hyponatremia ?-Nephro following ?-cont iHD and she underwent Dialysis today  ?-Trend BMP / urinary output ?-Replace electrolytes as indicated ?-Patient's BUNs/creatinine is now 63/4.94 -> 41/3.67 ?-Sodium went from 133 -> 134 -> 131 -> 134 ?-Avoid nephrotoxic agents, contrast dyes, ensure adequate renal perfusion to prevent dehydration and hypoperfusion and renally dose medications ?-Repeat CMP in a.m. ? ?Hypokalemia ?-Patient's K+ is now 3.0 ?-Repelte with KCL 40 mEQ x1 via NG ?-Continue to Monitor and Replete as Necessary ?-Repeat CMP in the AM  ?  ?Anemia of Chronic Disease ?-Patient's  hemoglobin/hematocrit has now dropped to 6.7/20.6 today  ?-Checked Anemia Panel and showed an iron level of 27, U IBC of 290, TIBC of 246, saturation ratio of 11%, ferritin of 715 ?-Type and Screen and Transfuse 1 unit of pRBC; Now repeat Hgb/Hct is now 8.5/25.8 ?-Continue monitor for signs and symptoms of bleeding; no overt bleeding noted but she did have some blood on trach ?-Repeat CBC in a.m. ? ?HFrEF ?Coronary artery disease/hyperlipidemia ?-BP soft overnight since starting clonidine taper ?-will decrease dose of hydralazine ?-cont carvedilol and clonidine ?-cont ASA and statin ?-Continue Zetia ?-Stric I's and O's and Daily Weights; Patient is +8.030 Liters  ?-Continue volume maintenance with hemodialysis ?  ?Fever ?-Unclear etiology but could be a drug fever but neurology suspects that she could have had some hypoxic injury that has not been seen on MRI and feel that the intermittent  temperatures that she is spiking intermittently can be caused by hypoxic brain injury ?-Had a Tmax of 101.3 a few days ago and overnight she had a fever of 102.9 ?-Check blood cultures x2 and showed NGTD at 2 Daus ?-Chest x-ray shows improved aeration and this AM it showed "Tracheostomy tube and feeding tube remain in place. Low lung volumes again noted. Heart size is stable. No evidence of pulmonary consolidation or pleural effusion." ? -Check urinalysis and pending  ?-Will pan-Scan and obtain CT Chest/Abd/Pelvis and showed "Streaky opacities within both posterior RIGHT UPPER lobe and SUPERIOR segment RIGHT LOWER lobe, favor atelectasis  over infection/pneumonia. Mild to moderate bilateral LOWER lobe atelectasis. No evidence of acute abnormality within the abdomen  or pelvis. Cardiomegaly and coronary artery disease. Cholelithiasis  without CT evidence of acute cholecystitis. Unchanged splenomegaly. Aortic Atherosclerosis"  ?-If continues to Spike temperatures may consult ID for further evaluation and obtain a TTE ?   ?Leukocytosis ?-WBC went from 10.3 -> 11.7 -> 10.6 -> 10.7 -> 11.4 ?-Continue to monitor for signs and symptoms of infection ?-See above ?-Repeat CBC in the AM  ? ?Type 2 diabetes with hyperglycemia ?-cont SSI and cbg monitoring ?-cont bas

## 2021-10-29 NOTE — Progress Notes (Addendum)
?[facility] ?Progress Note  ? ?Subjective:   Patient seen and examined at bedside.  Remains non responsive.  Hemoglobin dropped to 6.7 today, getting 1 unit pRBC. Palliative spoke with patient's daughter who would like to allow more time to see if patient will improve, up to 3 months total.  To have information meeting in future to help provide family with understanding of patient illness and potential trajectory.  ? ?Objective ?Vitals:  ? 10/29/21 0400 10/29/21 0736 10/29/21 0752 10/29/21 0820  ?BP: 110/82 (!) 110/52 125/60 (!) 128/55  ?Pulse: 94 97 98 98  ?Resp: (!) 22 (!) 21 (!) 23   ?Temp: 98.4 ?F (36.9 ?C) 99.1 ?F (37.3 ?C) 98.5 ?F (36.9 ?C)   ?TempSrc: Oral Oral Axillary   ?SpO2: 99%  99%   ?Weight:      ?Height:      ? ?Physical Exam ?General:chronically ill appearing female in NAD ?Heart:RRR, no mrg ?Lungs:nml WOB, trach to O2 ?Abdomen:soft, NTND ?Extremities:trace LE edema ?Dialysis Access: LU AVF +b/t  ? ?Filed Weights  ? 10/24/21 1700 10/26/21 1017 10/26/21 1400  ?Weight: 82.1 kg 90 kg 88.3 kg  ? ? ?Intake/Output Summary (Last 24 hours) at 10/29/2021 1141 ?Last data filed at 10/29/2021 0800 ?Gross per 24 hour  ?Intake 740 ml  ?Output 2373 ml  ?Net -1633 ml  ? ? ?Additional Objective ?Labs: ?Basic Metabolic Panel: ?Recent Labs  ?Lab 10/27/21 ?0727 10/28/21 ?0251 10/29/21 ?0152  ?NA 134* 131* 134*  ?K 4.6 3.7 3.0*  ?CL 96* 93* 98  ?CO2 _0 ?GLUCOSE 108* 164* 133*  ?BUN 40* 63* 41*  ?CREATININE 3.60* 4.94* 3.67*  ?CALCIUM 8.7* 8.7* 8.4*  ?PHOS 4.5 5.0* 2.7  ? ?Liver Function Tests: ?Recent Labs  ?Lab 10/27/21 ?2258 10/27/21 ?3462 10/28/21 ?0251 10/29/21 ?0152  ?AST 30  --  32 35  ?ALT 25  --  26 25  ?ALKPHOS 134*  --  149* 133*  ?BILITOT 0.6  --  0.8 0.4  ?PROT 6.2*  --  6.5 5.9*  ?ALBUMIN 2.3* 2.5* 2.6* 2.4*  ? ?CBC: ?Recent Labs  ?Lab 10/25/21 ?0106 10/26/21 ?0159 10/27/21 ?1947 10/28/21 ?0251 10/29/21 ?0152  ?WBC 10.3 11.7* 10.6* 10.7* 11.4*  ?NEUTROABS 6.7  --  7.1 6.7 7.0  ?HGB 8.0*  7.6* 8.0* 8.2* 6.7*  ?HCT 23.9* 23.8* 24.2* 24.5* 20.6*  ?MCV 98.0 100.8* 98.4 98.0 100.5*  ?PLT 282 252 226 210 205  ? ?Blood Culture ?   ?Component Value Date/Time  ? SDES BLOOD RIGHT HAND 10/27/2021 1038  ? SPECREQUEST  10/27/2021 1038  ?  BOTTLES DRAWN AEROBIC AND ANAEROBIC Blood Culture adequate volume  ? CULT  10/27/2021 1038  ?  NO GROWTH 2 DAYS ?Performed at [facility], [place] [address] [place], [place] [address] ?  ? REPTSTATUS PENDING 10/27/2021 1038  ? ? ?CBG: ?Recent Labs  ?Lab 10/28/21 ?1458 10/28/21 ?2014 10/28/21 ?2339 10/29/21 ?1292 10/29/21 ?0755  ?GLUCAP 142* 117* 146* 124* 75  ? ?Iron Studies:  ?Recent Labs  ?  10/28/21 ?1445  ?IRON 27*  ?TIBC 246*  ?FERRITIN 715*  ? ?Lab Results  ?Component Value Date  ? INR 1.3 (H) 10/18/2021  ? INR 1.1 09/19/2021  ? INR 1.2 11/12/2019  ? ?Studies/Results: ?DG CHEST PORT 1 VIEW ? ?Result Date: 10/28/2021 ?CLINICAL DATA:  Shortness of breath. Acute respiratory failure. Endotracheally intubated. EXAM: PORTABLE CHEST 1 VIEW COMPARISON:  10/27/2021 FINDINGS: Tracheostomy tube and feeding tube remain in place. Low lung volumes  again noted. Heart size is stable. No evidence of pulmonary consolidation or pleural effusion. IMPRESSION: Low lung volumes. No acute findings. Electronically Signed   By: Marlaine Hind M.D.   On: 10/28/2021 08:30   ? ?Medications: ? sodium chloride Stopped (11/01/2021 2020)  ? feeding supplement (NEPRO CARB STEADY) 45 mL/hr at 10/27/21 0700  ? ferric gluconate (FERRLECIT) IVPB Stopped (10/26/21 1514)  ? ? aspirin  81 mg Per Tube Daily  ? atorvastatin  80 mg Per Tube q1800  ? calcitRIOL  0.25 mcg Per Tube Q T,Th,Sat-1800  ? carvedilol  6.25 mg Per Tube BID WC  ? chlorhexidine gluconate (MEDLINE KIT)  15 mL Mouth Rinse BID  ? Chlorhexidine Gluconate Cloth  6 each Topical Q0600  ? Chlorhexidine Gluconate Cloth  6 each Topical Q0600  ? cholestyramine  4 g Per Tube TID  ? darbepoetin (ARANESP) injection - DIALYSIS  150 mcg Intravenous Q Thu-HD   ? ezetimibe  10 mg Per Tube Daily  ? feeding supplement (PROSource TF)  45 mL Per Tube TID  ? fiber  1 packet Per Tube TID  ? folic acid  1 mg Per Tube Daily  ? gabapentin  300 mg Per Tube Q8H  ? heparin injection (subcutaneous)  5,000 Units Subcutaneous Q8H  ? hydrALAZINE  50 mg Per Tube BID  ? insulin aspart  0-15 Units Subcutaneous Q4H  ? insulin aspart  5 Units Subcutaneous Q4H  ? insulin detemir  15 Units Subcutaneous BID  ? lacosamide  50 mg Per Tube BID  ? leptospermum manuka honey  1 application. Topical UD  ? levothyroxine  50 mcg Per Tube QAC breakfast  ? mouth rinse  15 mL Mouth Rinse 10 times per day  ? multivitamin  1 tablet Per Tube QHS  ? pantoprazole sodium  40 mg Per Tube Daily  ? QUEtiapine  50 mg Per Tube BID  ? thiamine  100 mg Per Tube Daily  ? vitamin B-12  1,000 mcg Per Tube Daily  ? ? ?Dialysis Orders: ?TTS [facility] ?  3h 79min  75.5kg  2/2.5 bath  400 bfr   Hep none LUE AVF ? - 4/14 > hep B Ag negative and hep B Ab's low/ not protective ? - mircera 200 q2 wks ? - venofer 50 weekly ? - rocaltrol 0.25 tiw ?  ?Assessment/Plan: ?AMS: Found unresponsive at home. Had PRES and seizures, seen by neurology. Repeat MRI improved but mental status remains poor.  Sz meds lowered and getting provigil to promote wakefulness. Per neuro/ pmd. Appreciate palliative care discussions with family, plan for informational family meeting in near future.  Wants to continue on current care plan for now and see if improvements noted over next few months.  ?Resp failure: Initially intubated for airway protection, s/p trach 4/15 by ENT. Now off vent, with trach collar O2.   ?ESRD: Usual TTS schedule - Next HD on 4/25. ?HTN/ Volume: BP in goal.  Home meds reduced: Carvedilol 25mg  BID -> 6.25mg  BID, hydralazine 100mg  BID -> 50mg  BID. Clonidine 0.1 discontinued. [place] wt = 82kg. Not close to dry weight, CXR this AM with low lung volumes but no acute abnormalities.  UF as tolerated. ?T2DM: Insulin per  primary. ?Fever: tmax 103 yesterday.  Previous concern for sepsis S/p short course IV abx. Blood Cx 3/29 negative.  Work up per PMD. ?Anemia of ESRD: Hgb drop 6.7 this AM, 1 unit pRBC given today.  Tsat 11%. Continue Aranesp 12mcg q Thursday. D/c iron  with fever and concern for possible infection. ?Secondary hyperparathyroidism: CorrCa/Phos ok. Continue VDRA, no binder for now. ?CAD/ HL: On BB, asa, statin; per pmd.  ?Nutrition: Nepro TF + supplements.  Failed swallow study, will likely need PEG tube - family in agreement.  ?Unstageable sacral ulcer: Wound care following. ?Dispo: Placement for a patient on dialysis with a trach is exceedingly difficult as everyone is aware. ? ?Jen Mow, PA-C ?[facility] ?10/29/2021,11:41 AM ? LOS: 25 days  ? ? ?

## 2021-10-30 ENCOUNTER — Inpatient Hospital Stay (HOSPITAL_COMMUNITY): Payer: Medicaid Other

## 2021-10-30 DIAGNOSIS — R403 Persistent vegetative state: Secondary | ICD-10-CM

## 2021-10-30 DIAGNOSIS — I6783 Posterior reversible encephalopathy syndrome: Secondary | ICD-10-CM | POA: Diagnosis not present

## 2021-10-30 DIAGNOSIS — N186 End stage renal disease: Secondary | ICD-10-CM | POA: Diagnosis not present

## 2021-10-30 DIAGNOSIS — R4182 Altered mental status, unspecified: Secondary | ICD-10-CM | POA: Diagnosis not present

## 2021-10-30 DIAGNOSIS — Z930 Tracheostomy status: Secondary | ICD-10-CM | POA: Diagnosis not present

## 2021-10-30 DIAGNOSIS — G9340 Encephalopathy, unspecified: Secondary | ICD-10-CM | POA: Diagnosis not present

## 2021-10-30 LAB — CBC
HCT: 24.2 % — ABNORMAL LOW (ref 36.0–46.0)
Hemoglobin: 7.9 g/dL — ABNORMAL LOW (ref 12.0–15.0)
MCH: 31.9 pg (ref 26.0–34.0)
MCHC: 32.6 g/dL (ref 30.0–36.0)
MCV: 97.6 fL (ref 80.0–100.0)
Platelets: 209 10*3/uL (ref 150–400)
RBC: 2.48 MIL/uL — ABNORMAL LOW (ref 3.87–5.11)
RDW: 18.2 % — ABNORMAL HIGH (ref 11.5–15.5)
WBC: 9.2 10*3/uL (ref 4.0–10.5)
nRBC: 0 % (ref 0.0–0.2)

## 2021-10-30 LAB — COMPREHENSIVE METABOLIC PANEL
ALT: 31 U/L (ref 0–44)
AST: 36 U/L (ref 15–41)
Albumin: 2.5 g/dL — ABNORMAL LOW (ref 3.5–5.0)
Alkaline Phosphatase: 165 U/L — ABNORMAL HIGH (ref 38–126)
Anion gap: 11 (ref 5–15)
BUN: 65 mg/dL — ABNORMAL HIGH (ref 6–20)
CO2: 23 mmol/L (ref 22–32)
Calcium: 8.6 mg/dL — ABNORMAL LOW (ref 8.9–10.3)
Chloride: 99 mmol/L (ref 98–111)
Creatinine, Ser: 5.31 mg/dL — ABNORMAL HIGH (ref 0.44–1.00)
GFR, Estimated: 9 mL/min — ABNORMAL LOW (ref >60)
Glucose, Bld: 152 mg/dL — ABNORMAL HIGH (ref 70–99)
Potassium: 3.6 mmol/L (ref 3.5–5.1)
Sodium: 133 mmol/L — ABNORMAL LOW (ref 135–145)
Total Bilirubin: 0.3 mg/dL (ref 0.3–1.2)
Total Protein: 6.6 g/dL (ref 6.5–8.1)

## 2021-10-30 LAB — CBC WITH DIFFERENTIAL/PLATELET
Abs Immature Granulocytes: 0.18 10*3/uL — ABNORMAL HIGH (ref 0.00–0.07)
Basophils Absolute: 0.1 10*3/uL (ref 0.0–0.1)
Basophils Relative: 1 %
Eosinophils Absolute: 0.4 10*3/uL (ref 0.0–0.5)
Eosinophils Relative: 4 %
HCT: 25.3 % — ABNORMAL LOW (ref 36.0–46.0)
Hemoglobin: 8.2 g/dL — ABNORMAL LOW (ref 12.0–15.0)
Immature Granulocytes: 2 %
Lymphocytes Relative: 15 %
Lymphs Abs: 1.5 10*3/uL (ref 0.7–4.0)
MCH: 31.5 pg (ref 26.0–34.0)
MCHC: 32.4 g/dL (ref 30.0–36.0)
MCV: 97.3 fL (ref 80.0–100.0)
Monocytes Absolute: 1.7 10*3/uL — ABNORMAL HIGH (ref 0.1–1.0)
Monocytes Relative: 17 %
Neutro Abs: 6.2 10*3/uL (ref 1.7–7.7)
Neutrophils Relative %: 61 %
Platelets: 211 10*3/uL (ref 150–400)
RBC: 2.6 MIL/uL — ABNORMAL LOW (ref 3.87–5.11)
RDW: 19 % — ABNORMAL HIGH (ref 11.5–15.5)
WBC: 10.1 10*3/uL (ref 4.0–10.5)
nRBC: 0 % (ref 0.0–0.2)

## 2021-10-30 LAB — GLUCOSE, CAPILLARY
Glucose-Capillary: 152 mg/dL — ABNORMAL HIGH (ref 70–99)
Glucose-Capillary: 123 mg/dL — ABNORMAL HIGH (ref 70–99)
Glucose-Capillary: 122 mg/dL — ABNORMAL HIGH (ref 70–99)
Glucose-Capillary: 162 mg/dL — ABNORMAL HIGH (ref 70–99)
Glucose-Capillary: 117 mg/dL — ABNORMAL HIGH (ref 70–99)
Glucose-Capillary: 145 mg/dL — ABNORMAL HIGH (ref 70–99)

## 2021-10-30 LAB — RENAL FUNCTION PANEL
Albumin: 2.6 g/dL — ABNORMAL LOW (ref 3.5–5.0)
Albumin: 2.4 g/dL — ABNORMAL LOW (ref 3.5–5.0)
Anion gap: 13 (ref 5–15)
Anion gap: 14 (ref 5–15)
BUN: 71 mg/dL — ABNORMAL HIGH (ref 6–20)
BUN: 86 mg/dL — ABNORMAL HIGH (ref 6–20)
CO2: 24 mmol/L (ref 22–32)
CO2: 21 mmol/L — ABNORMAL LOW (ref 22–32)
Calcium: 8.8 mg/dL — ABNORMAL LOW (ref 8.9–10.3)
Calcium: 8.4 mg/dL — ABNORMAL LOW (ref 8.9–10.3)
Chloride: 99 mmol/L (ref 98–111)
Chloride: 99 mmol/L (ref 98–111)
Creatinine, Ser: 5.85 mg/dL — ABNORMAL HIGH (ref 0.44–1.00)
Creatinine, Ser: 6.56 mg/dL — ABNORMAL HIGH (ref 0.44–1.00)
GFR, Estimated: 8 mL/min — ABNORMAL LOW (ref >60)
GFR, Estimated: 7 mL/min — ABNORMAL LOW (ref >60)
Glucose, Bld: 127 mg/dL — ABNORMAL HIGH (ref 70–99)
Glucose, Bld: 177 mg/dL — ABNORMAL HIGH (ref 70–99)
Phosphorus: 4.5 mg/dL (ref 2.5–4.6)
Phosphorus: 5.3 mg/dL — ABNORMAL HIGH (ref 2.5–4.6)
Potassium: 3.8 mmol/L (ref 3.5–5.1)
Potassium: 3.8 mmol/L (ref 3.5–5.1)
Sodium: 136 mmol/L (ref 135–145)
Sodium: 134 mmol/L — ABNORMAL LOW (ref 135–145)

## 2021-10-30 LAB — TYPE AND SCREEN
ABO/RH(D): O NEG
Antibody Screen: NEGATIVE
Unit division: 0

## 2021-10-30 LAB — BPAM RBC
Blood Product Expiration Date: 202305122359
ISSUE DATE / TIME: 202304230732
Unit Type and Rh: 9500

## 2021-10-30 LAB — MAGNESIUM: Magnesium: 2.3 mg/dL (ref 1.7–2.4)

## 2021-10-30 LAB — PHOSPHORUS: Phosphorus: 4.2 mg/dL (ref 2.5–4.6)

## 2021-10-30 MED ORDER — CHLORHEXIDINE GLUCONATE CLOTH 2 % EX PADS: 6.0000 | MEDICATED_PAD | Freq: Every day | CUTANEOUS | Status: DC

## 2021-10-30 MED ORDER — SODIUM CHLORIDE 0.9 % IV SOLN: 100.0000 mL | INTRAVENOUS | Status: DC | PRN

## 2021-10-30 MED ORDER — ALTEPLASE 2 MG IJ SOLR: 2.0000 mg | Freq: Once | INTRAMUSCULAR | Status: DC | PRN

## 2021-10-30 MED ORDER — HEPARIN SODIUM (PORCINE) 1000 UNIT/ML DIALYSIS: 1000.0000 [IU] | INTRAMUSCULAR | Status: DC | PRN

## 2021-10-30 MED ORDER — PENTAFLUOROPROP-TETRAFLUOROETH EX AERO: 1.0000 "application " | INHALATION_SPRAY | CUTANEOUS | Status: DC | PRN

## 2021-10-30 MED ORDER — LIDOCAINE HCL (PF) 1 % IJ SOLN: 5.0000 mL | INTRAMUSCULAR | Status: DC | PRN

## 2021-10-30 MED ORDER — LIDOCAINE-PRILOCAINE 2.5-2.5 % EX CREA
1.0000 "application " | TOPICAL_CREAM | CUTANEOUS | Status: DC | PRN
  Filled 2021-10-30: qty 5

## 2021-10-30 MED ORDER — LIDOCAINE-PRILOCAINE 2.5-2.5 % EX CREA: 1.0000 "application " | TOPICAL_CREAM | CUTANEOUS | Status: DC | PRN

## 2021-10-30 MED ORDER — LIDOCAINE HCL (PF) 1 % IJ SOLN
5.0000 mL | INTRAMUSCULAR | Status: DC | PRN
  Filled 2021-10-30: qty 5

## 2021-10-30 NOTE — Progress Notes (Signed)
Subjective: No acute events overnight. ? ?ROS: Unable to obtain due to poor mental status ? ?Examination ? ?Vital signs in last 24 hours: ?Temp:  [97.8 ?F (36.6 ?C)-98.3 ?F (36.8 ?C)] 97.8 ?F (36.6 ?C) (04/24 1152) ?Pulse Rate:  [90-107] 90 (04/24 1152) ?Resp:  [17-21] 18 (04/24 1152) ?BP: (97-148)/(44-89) 126/65 (04/24 1152) ?SpO2:  [95 %-100 %] 97 % (04/24 1143) ?FiO2 (%):  [28 %] 28 % (04/24 1143) ? ?General: lying in bed, NAD ?Neuro: Did not open eyes spontaneously and tried to resist when I tried opening her eyes, did open her eyes eventually but did not track examiner, did not follow commands, no apparent facial asymmetry, spontaneously moving bilateral upper extremities, withdraws to noxious stimuli in bilateral lower extremities ? ?Basic Metabolic Panel: ?Recent Labs  ?Lab 10/27/21 ?6384 10/27/21 ?6659 10/28/21 ?9357 10/29/21 ?0177 10/30/21 ?0123 10/30/21 ?9390  ?NA 133* 134* 131* 134* 133* 136  ?K 3.2* 4.6 3.7 3.0* 3.6 3.8  ?CL 97* 96* 93* 98 99 99  ?CO2 '26 26 24 25 23 24  '$ ?GLUCOSE 182* 108* 164* 133* 152* 127*  ?BUN 34* 40* 63* 41* 65* 71*  ?CREATININE 3.17* 3.60* 4.94* 3.67* 5.31* 5.85*  ?CALCIUM 8.4* 8.7* 8.7* 8.4* 8.6* 8.8*  ?MG 2.0  --  2.3 2.0 2.3  --   ?PHOS 3.4 4.5 5.0* 2.7 4.2 4.5  ? ? ?CBC: ?Recent Labs  ?Lab 10/25/21 ?0106 10/26/21 ?0159 10/27/21 ?3009 10/28/21 ?0251 10/29/21 ?0152 10/29/21 ?1322 10/30/21 ?0123  ?WBC 10.3 11.7* 10.6* 10.7* 11.4*  --  10.1  ?NEUTROABS 6.7  --  7.1 6.7 7.0  --  6.2  ?HGB 8.0* 7.6* 8.0* 8.2* 6.7* 8.5* 8.2*  ?HCT 23.9* 23.8* 24.2* 24.5* 20.6* 25.8* 25.3*  ?MCV 98.0 100.8* 98.4 98.0 100.5*  --  97.3  ?PLT 282 252 226 210 205  --  211  ? ? ? ?Coagulation Studies: ?No results for input(s): LABPROT, INR in the last 72 hours. ? ?Imaging ?No new brain imaging overnight ? ?ASSESSMENT AND PLAN: 51 year old female with seizures and altered mental status in the setting of systolic blood pressure 233 which has since improved. ?  ?Seizure ?Posterior reversible encephalopathy  syndrome (PRES), resolved ?Persistent encephalopathy ?Intermittent fevers ?-No clear cause of encephalopathy.  No evidence of infection, no toxic -metabolic causes, seizures have resolved, has been off sedation for more than 23 weeks, MRI brain has been improving.  It is possible patient had some hypoxic or hypoglycemic injury ?-Intermittent fevers could be secondary to hypoxic injury.  No evidence of infection.  Another etiology could be DVT ?  ?Recommendations ?-Continue Vimpat 50 mg twice daily.  This can likely be tapered off after June ?-Also on gabapentin and Seroquel which is mostly for agitation.  If no further episodes of agitation, consider weaning off. ?-Patient has been in the hospital for almost a month, off sedation for almost 3 weeks with minimally responsive state.  She does appear to have some improvement in exam compared to my last exam on 10/25/2021 (now opening eyes and more movement in bilateral upper extremities).  However,  she did not appear to have purposeful movements.  If patient's goal included independent living, I suspect that would be difficult to achieve.  However, if patient and/or family are okay with patient requiring significant help with ADLs, could consider PEG tube and monitoring for another couple of months.  At that point, if patient continues to remain in minimally responsive state, it might be worthwhile to revisit goals of care ?-  Continue seizure precautions ?-As needed IV Ativan 2 mg for clinical seizure-like activity ?-Management of rest of comorbidities per primary team ?-Discussed plan in detail with Dr. Shelton Silvas and palliative care team ?-Recommend follow-up with neurology in 4-6 weeks after discharge ?  ?I have spent a total of 38 minutes with the patient reviewing hospital notes,  test results, labs and examining the patient as well as establishing an assessment and plan. > 50% of time was spent in direct patient care. ?  ? ?Zeb Comfort ?Epilepsy ?[facility] ?For questions after 5pm please refer to AMION to reach the Neurologist on call ? ?

## 2021-10-30 NOTE — Progress Notes (Signed)
? ?NAME:  Melanie Hall, MRN:  [id number], DOB:  1971/03/28, LOS: 81 ?ADMISSION DATE:  10/03/2021, CONSULTATION DATE:  09/11/2021 ?REFERRING MD:  Dr. Roderic Palau, ER, CHIEF COMPLAINT:  AMS  ? ?History of Present Illness:  ?51 yo female former smoker with hx of ESRD on HD was found at her home by patient transport driver unresponsive.  Pt's neighbor noted that she was tapping her hands for about a minute, and then stopped.  She has snoring and drooling.  No improvement after narcan.  CBG normal.  Noted to have BP 215/96.  Intubated in [facility] for airway protection, given keppra load, started on diprivan, and started on antibiotics.  CT head negative for acute findings.  Treated for hyperkalemia.  Pt's daughter report she had similar episode on 09/24/21 and EMS was called to the house, but episode resolve and patient declined hospital evaluation.  No prior history of seizures or known head injury.  ER team d/w case with neurology and advised to transfer to [facility] for EEG monitoring and further neurology assessment. ? ?Pertinent  Medical History  ?CHF, Anemia, OA, CAD, DM type 2, ESRD on HD, HLD, HTN, CVA ? ?Significant Hospital Events: ?Including procedures, antibiotic start and stop dates in addition to other pertinent events   ?3/29 present to [facility], intubated, transfer to [facility] ?3/30 MRI Brain consistent with PRES ?4/3 off sedation, not waking up ?4/4 transitioned off insulin gtt to SSI ?4/5 [place] conversation; see IPAL note ?4/8 MRI with no acute findings ?4/11 trial of extubation failed, she was reintubated, bedside tracheostomy was attempted but failed ?4/12 CT neck was done, negative for acute findings ?4/15-tracheostomy, performed by ENT ?4/16 trach collar ?4/18: TCT overnight and yesterday; added seroquel and clonidine taper; weaned off precedex at 5pm ?4/19: remains on TCT >48 hours ?4/24: Tolerating TCT, suture removal/gauze change ? ?Interim History / Subjective:  ?No significant events overnight ?Mental status unchanged,  remains grossly nonverbal/minimally interactive ?Trach site with significant crusting/old blood, sutures remain in place ?OK to remove sutures today ?Hgb stable s/p transfusion 1U PRBC 4/23 ? ?Objective:  ?Blood pressure 138/69, pulse 96, temperature 98.3 ?F (36.8 ?C), temperature source Oral, resp. rate 17, height '5\' 3"'$  (1.6 m), weight 88.3 kg, last menstrual period 12/17/2016, SpO2 97 %. ?   ?FiO2 (%):  [28 %] 28 %  ? ?Intake/Output Summary (Last 24 hours) at 10/30/2021 1028 ?Last data filed at 10/29/2021 1045 ?Gross per 24 hour  ?Intake 380.42 ml  ?Output --  ?Net 380.42 ml  ? ? ?Filed Weights  ? 10/24/21 1700 10/26/21 1017 10/26/21 1400  ?Weight: 82.1 kg 90 kg 88.3 kg  ? ?Physical Examination: ?General: Chronically ill-appearing middle-aged woman in NAD. Nonverbal. ?HEENT: [place]/AT, anicteric sclera, PERRL, moist mucous membranes. ?Neuro:  Nonverbal, minimally interactive. Opens eyes/does not speak.  Opens eyes to verbal stimuli, tracks. Not following commands. Moves all 4 extremities spontaneously.+Cough and +Gag  ?CV: RRR, no m/g/r. ?PULM: Breathing even and unlabored on ATC (28%). Lung fields coarse throughout upper fields/upper airway. ?GI: Soft, nontender, nondistended. Normoactive bowel sounds. ?Extremities: No significant LE edema noted. ?Skin: Warm/dry, no rashes. ? ?Resolved Hospital Problem List:  ?Hyperkalemia, resolved ?Hyponatremia, resolved ? ?Assessment & Plan:  ? ?PRES with associated status epilepticus ?Acute metabolic encephalopathy secondary to PRES ?Last MRI did show resolution of findings consistent with PRES ?- Remains off of Precedex ?- Seroquel and clonidine taper (4/18) ?- Neurology following, appreciate recs ?- Frequent neurochecks ? ?Acute respiratory failure secondary to probable aspiration pneumonia ?S/p tracheostomy  10/11/2021 by Dr. Constance Holster #8 [place] cuffed. ?- Continue supplemental O2 support (ATC) ?- Wean FiO2 for sat > 90% ?- Pulmonary hygiene ?- Deflate trach cuff ?- Minimize  suctioning as able, limit bloody drainage ?- Trach care per protocol ?- Ok to remove sutures 4/24AM to change gauze dressing ? ?End stage renal disease on hemodialysis ?- Nephro following for HD ?- Trend BMP ?- Replete electrolytes as indicated ?- Monitor I&Os ? ?HFrEF ?Coronary artery disease/hyperlipidemia ?- Per Primary Team ?- Consider repeat Echo if clinically indicated ? ?Type 2 diabetes with hyperglycemia ?CBG (last 3)  ?- Continue SSI, Levemir ? ?Hypothyroidism ?- Continue Synthroid ? ?Unstageable sacral ulcer ?- WOC PRN ? ?Best Practice (right click and "Reselect all SmartList Selections" daily)  ?Per primary ?Diet/type: tubefeeds ?DVT prophylaxis: prophylactic heparin  ?GI prophylaxis: PPI ?Lines: N/A ?Foley:  N/A ?Code Status:  full code ?GOC: 4/19 called daughter [name] over phone and updated ? ?Lestine Mount, PA-C ?[facility] ?10/30/21 10:44 AM ? ?Please see [email or web address] for pager details. ? ?From 7A-7P if no response, please call [phone] ?After hours, please call ELink [phone]  ?

## 2021-10-30 NOTE — Progress Notes (Signed)
? ?                                                                                                                                                     ?                                                   ?Daily Progress Note  ? ?Patient Name: Melanie Hall       Date: 10/30/2021 ?DOB: 1970/09/23  Age: 51 y.o. MRN#: [id number] ?Attending Physician: Kerney Elbe, DO ?Primary Care Physician: Renee Rival, FNP ?Admit Date: 09/07/2021 ? ?Reason for Consultation/Follow-up: Establishing goals of care ? ?Patient Profile/HPI:  The patient is a 51 year old former smoker with past medical history significant for but not limited to history of ESRD on hemodialysis, CHF, anemia, osteoarthritis, history of CAD, history of diabetes mellitus type 2, hypertension, hyperlipidemia, history of CVA as well as other comorbidities who was found at her home by her patient transport driver to be unresponsive.  Patient's neighbor noted that she was tapping her hands for about a minute and then stop.  She was snoring and drooling and had no improvement after Narcan was given.  She is noted to have a normal CBG and blood pressure was 215/96 on admission.  She was [facility] for airway [facility], given a Keppra load and started on dipper Van and started on antibiotics.  Head CT was negative for any acute findings and she was treated for hyperkalemia.  She did similar episode on 09/24/2021 when EMS was called to the house but the episode resolved and patient declined hospital evaluation.  She had no prior history of seizures or known head injury and the ED team discussed with neurology and they advised the patient to be transferred to [facility] for EEG monitoring and further neurology assessment. MRI on 3/30 indicated PRES. She has remained minimally responsive- s/p trach. Cortrack is in.  ?  ?Palliative care has been asked to get involved to have further goals of care conversations.  ? ?Subjective: ?Chart  reviewed including labs, progress notes, imaging.  ?Evaluated patient. She did not respond to my voice- did not follow commands.  ?Plan was to attempt to arrange conference call with patient's daughter [name] and attending provider and neuro. Attempted to call [name]- left message requesting return call.  ? ?Review of Systems  ?Unable to perform ROS: Mental status change  ? ? ?Physical Exam ?Vitals and nursing note reviewed.  ?Neurological:  ?   Comments: Bil upper extremities contracted  ?         ? ?Vital Signs: BP 126/65 (BP Location: Right Arm)   Pulse  90   Temp 97.8 ?F (36.6 ?C) (Oral)   Resp 18   Ht '5\' 3"'$  (1.6 m)   Wt 88.3 kg   LMP 12/17/2016   SpO2 97%   BMI 34.48 kg/m?  ?SpO2: SpO2: 97 % ?O2 Device: O2 Device: Tracheostomy Collar ?O2 Flow Rate: O2 Flow Rate (L/min): 5 L/min ? ?Intake/output summary: No intake or output data in the 24 hours ending 10/30/21 1205 ?LBM: Last BM Date : 10/27/21 ?Baseline Weight: Weight: 83.9 kg ?Most recent weight: Weight: 88.3 kg ? ?     ?Palliative Assessment/Data: PPS: 10% ? ? ? ? ? ?Patient Active Problem List  ? Diagnosis Date Noted  ? Acute encephalopathy   ? Goals of care, counseling/discussion   ? PRES (posterior reversible encephalopathy syndrome)   ? ESRD (end stage renal disease) ([place])   ? Acute respiratory failure ([place])   ? Secondary hypertension   ? Type 2 diabetes mellitus with hyperglycemia (H[facility])   ? Pressure injury of skin 10/05/2021  ? Altered mental status 09/21/2021  ? Swelling of left lower extremity 09/20/2021  ? CAD (coronary artery disease) 02/17/2021  ? Ischemic cardiomyopathy 02/17/2021  ? S/P ankle fusion 06/01/2020  ? Pilon fracture of left tibia, closed, initial encounter   ? Displaced trimalleolar fracture of left lower leg, initial encounter for closed fracture   ? Abnormality of gait and mobility 05/26/2020  ? Migraine 05/26/2020  ? Diabetic retinopathy ([place]) 05/04/2020  ? Chronic diarrhea of unknown origin 01/14/2020  ? Type 2 diabetes  mellitus with chronic kidney disease on chronic dialysis, with long-term current use of insulin ([place]) 12/29/2019  ? Hypothyroidism 12/29/2019  ? Weakness of both lower extremities 11/12/2019  ? Debility 09/10/2019  ? ESRD (end stage renal disease) on dialysis ([place]) 09/10/2019  ? Depression 09/10/2019  ? Incontinence in female 09/10/2019  ? Anemia in chronic kidney disease 08/19/2019  ? MGUS (monoclonal gammopathy of unknown significance) 07/23/2019  ? GERD (gastroesophageal reflux disease) 02/28/2019  ? Essential hypertension 10/28/2018  ? Irregular menses 09/14/2009  ? Hypercholesterolemia 11/19/2008  ? Type 2 diabetes with nephropathy ([place]) 07/02/2007  ? ? ?Palliative Care Assessment & Plan  ? ? ?Assessment/Recommendations/Plan ? ?Continue current plan- full scope, DNR ?Message left for [facility] requesting return call ? ? ?Code Status: ?DNR ? ?Prognosis: ? Unable to determine ? ?Discharge Planning: ?To Be Determined ? ? ?   ?Greater than 50%  of this time was spent counseling and coordinating care related to the above assessment and plan. ? ?Mariana Kaufman, AGNP-C ?Palliative Medicine ? ? ?Please contact Palliative Medicine Team phone at [phone] for questions and concerns.  ? ? ? ? ? ? ?

## 2021-10-30 NOTE — Progress Notes (Signed)
?PROGRESS NOTE ? ? ? ?Melanie Hall  LYY:[id number] DOB: Feb 26, 1971 DOA: 10/03/2021 ?PCP: Renee Rival, FNP  ? ?Brief Narrative:  ?The patient is a 51 year old former smoker with past medical history significant for but not limited to history of ESRD on hemodialysis, CHF, anemia, osteoarthritis, history of CAD, history of diabetes mellitus type 2, hypertension, hyperlipidemia, history of CVA as well as other comorbidities who was found at her home by her patient transport driver to be unresponsive.  Patient's neighbor noted that she was tapping her hands for about a minute and then stop.  She was snoring and drooling and had no improvement after Narcan was given.  She is noted to have a normal CBG and blood pressure was 215/96 on admission.  She was intubated in [facility] pain hospital for airway protection, given a Keppra load and started on dipper Van and started on antibiotics.  Head CT was negative for any acute findings and she was treated for hyperkalemia.  She did similar episode on 09/24/2021 when EMS was called to the house but the episode resolved and patient declined hospital evaluation.  She had no prior history of seizures or known head injury and the ED team discussed with neurology and they advised the patient to be transferred to [facility] for EEG monitoring and further neurology assessment. ? ?Current significant hospital events are as below:  ?Significant Hospital Events: ?Including procedures, antibiotic start and stop dates in addition to other pertinent events   ?3/29 present to [facility], intubated, transfer to [facility] ?3/30 MRI Brain consistent with PRES ?4/3 off sedation, not waking up ?4/4 transitioned off insulin gtt to SSI ?4/5 [place] conversation; see IPAL note ?4/8 MRI with no acute findings ?4/11 trial of extubation failed, she was reintubated, bedside tracheostomy was attempted but failed ?4/12 CT neck was done, negative for acute findings ?4/15-tracheostomy, performed by ENT ?4/16 trach  collar ?4/18: TCT overnight and yesterday; added seroquel and clonidine taper; weaned off precedex at 5pm ?4/19: remains on TCT >48 hours  ?4/20: Transferred to [place] and underwent Dialysis again.  ?4/21: Has not changed with her mental status and had some bloody secretions from her trachea.  Palliative care has been consulted given that she is unresponsive.  Continues to be on 5 L and on Cortrak.  We will need to discuss about PEG tube options of SNF placement however there are several barriers given patient's insurance and no dialysis centers that take trachs ?4/22: Palliative Care Discussions being held and patient made DNR and she underwent Dialysis again; PEG tube discussions will be held further if no improvement in speech therapy but patient daughter will allow more time to see if the patient makes any improvement ?4/23- Blood count dropped to 6.7 so will be transfused 1 unit of pRBCs. Palliative continues conversations and Daughter would pursue PEG. Continues to have Fevers so will pan-scan but neurology suspects that she may have some hypoxic injury is not showing up on MRI and feels that this could be indicative of her low intermittent fevers ?4/24 -still not as responsive but she remains off of Precedex.  Seroquel and clonidine are being tapered.  Neurology is following and she continues to spike intermittent fevers but neurology feels that it could be from DVTs so we will order bilateral upper extremity and lower extremity duplexes to evaluate.  Per pulmonary is okay to remove sutures this morning and change gauze dressing.  The plan was to have a conference call with the patient daughter  but daughter did not pick up the phone as she did not yesterday.  Nephrology is following and plan is for next HD session on 10/31/2021 neurology recommending continuing Vimpat and tapering off after June.  Neurology feels that she does not have any purposeful movements  ? ? ?Assessment and Plan: ? ?PRES syndrome  with associated status epilepticus ?Acute metabolic encephalopathy secondary to PRES ?-Last MRI did show resolution of findings consistent with press ?-precedex off 4/18 ?-seroquel and clonidine taper started 4/18 ?-cont vimpat and provigil per neuro for seizure ppx ?-cont gabapentin and seroquel  ?-C/w IV Ativan 2 mg for clinical [place]-like activity ?-frequent neurochecks; seizure precaution in place ?-Continues to be significantly encephalopathic and was not responsive to examination again this morning to me but neurology reevaluated and Dr. Hortense Ramal feels that the patient does have more improvement in her exam compared to her last exam on 10/26/2018 given that she opens her eyes and has more movement in the bilateral lower extremities however these movements do not appear purposeful ?-Neurology following and she was getting Provigil to promote wakefulness ?-SLP evaluated and recommending NPO given that she demonstrates no functional ability to swallow ?-Palliative Care consulted for [place] Discussions and she is now DNR. Daughter would allow for time to see if patient improves and Discussions about PEG are happening but Daughter is ok with PEG to be placed; Palliative asking Neurology Dr. Hortense Ramal weigh in again given concern that patient will continue to deteriorate in the long run and Dr. Hortense Ramal recommends continuing Vimpat 50 mg p.o. twice daily and considering PEG placement and monitoring for another couple months if patient's family would like to pursue aggressive care ?  ?Acute respiratory failure secondary to probable aspiration pneumonia S/p tracheostomy because of Prolonged Vent Dependence due to Encephalopathy ?-Continue trach collar as tolerated; has been on for >48 hours ?-prn tracheal suction ?-trach care per protocol routine trach care; RRT discussed about patient's trach sutures and the ability to change the trach gauze and clean around the stoma but pulmonary prefers for the RT to wait until the weekend to  remove the trach sutures ?-Recommending change to cuffless trach per ENT recommendations and pulmonary saw today and deflated her trach cuff and recommending continue pulmonary hygiene and minimizing suction as able and continue trach care.  There feels that she is safe to have her sutures removed today and have her dressing changed ?-SpO2: 97 % ?O2 Flow Rate (L/min): 5 L/min ?FiO2 (%): 28 %; Continue supplemental oxygen via ATC to maintain O2 saturations greater than 90% ?-Continue supportive care: Palliative care consulted for goals of care discussion ?-She has had a Cortrak in place and will need to change out to either a PEG and will have further goals of care discussion the patient's family and family likely leaning towards PEG Placement with further GOC discussion ? ?End Stage Renal Disease on hemodialysis Tuesday Thursday Saturday ?Hyponatremia ?-cont iHD and she underwent Dialysis yesterday ?-Trend BMP / urinary output ?-Replace electrolytes as indicated ?-Patient's BUNs/creatinine is now 63/4.94 -> 41/3.67 -> 65/5.31 -> 71/5.85 ?-Sodium went from 133 -> 134 -> 131 -> 134 and improved to 136 today ?-Avoid nephrotoxic agents, contrast dyes, ensure adequate renal perfusion to prevent dehydration and hypoperfusion and renally dose medications ?-Nephrology is following and plan is for HD on 10/31/2021 ?-Repeat CMP in a.m. ?  ?Hypokalemia ?-Patient's K+ is now 3.0 -> 3.6 -> 3.8 ?-Continue to Monitor and Replete as Necessary ?-Repeat CMP in the AM  ?  ?Anemia  of Chronic Disease ?-Patient's hemoglobin/hematocrit has now dropped to 6.7/20.6 today  ?-Checked Anemia Panel and showed an iron level of 27, U IBC of 290, TIBC of 246, saturation ratio of 11%, ferritin of 715 ?-Type and Screen and Transfuse 1 unit of pRBC; Now repeat Hgb/Hct is now 8.5/25.8 yesterday and today it is now 8.2/25.3 ?-Continue monitor for signs and symptoms of bleeding; no overt bleeding noted but she did have some blood on trach ?-Repeat CBC in  a.m. ? ?HFrEF ?Coronary artery disease/hyperlipidemia ?-BP soft overnight since starting clonidine taper ?-will decrease dose of hydralazine ?-cont carvedilol and clonidine ?-cont ASA and statin ?-Continue Zet

## 2021-10-30 NOTE — Progress Notes (Addendum)
?[facility] ?Progress Note  ? ?Subjective:   Patient seen and examined at bedside.  Cracked open L eye to verbal stimuli initially.  Move hands slightly when asked to squeeze but did not repeat any of these commands when repeated.   ? ?Objective ?Vitals:  ? 10/30/21 0500 10/30/21 0749 10/30/21 0755 10/30/21 0925  ?BP: 108/74  (!) 148/67 138/69  ?Pulse:  99 96 96  ?Resp:  18 17   ?Temp:   98.3 ?F (36.8 ?C)   ?TempSrc:   Oral   ?SpO2:  96% 97%   ?Weight:      ?Height:      ? ?Physical Exam ?General:chronically ill appearing female in NAD ?Heart:RRR, no mrg ?Lungs: mostly CTAB, nml WOB on trach to 2L O2 ?Abdomen:soft, NTND ?Extremities: trace LE edema ?Dialysis Access: LU AVF+ b/t  ? ?Filed Weights  ? 10/24/21 1700 10/26/21 1017 10/26/21 1400  ?Weight: 82.1 kg 90 kg 88.3 kg  ? ?No intake or output data in the 24 hours ending 10/30/21 1121 ? ?Additional Objective ?Labs: ?Basic Metabolic Panel: ?Recent Labs  ?Lab 10/29/21 ?4481 10/30/21 ?0123 10/30/21 ?0741  ?NA 134* 133* 136  ?K 3.0* 3.6 3.8  ?CL 98 99 99  ?CO2 '25 23 24  ' ?GLUCOSE 133* 152* 127*  ?BUN 41* 65* 71*  ?CREATININE 3.67* 5.31* 5.85*  ?CALCIUM 8.4* 8.6* 8.8*  ?PHOS 2.7 4.2 4.5  ? ?Liver Function Tests: ?Recent Labs  ?Lab 10/28/21 ?8563 10/29/21 ?1497 10/30/21 ?0123 10/30/21 ?0741  ?AST 32 35 36  --   ?ALT '26 25 31  ' --   ?ALKPHOS 149* 133* 165*  --   ?BILITOT 0.8 0.4 0.3  --   ?PROT 6.5 5.9* 6.6  --   ?ALBUMIN 2.6* 2.4* 2.5* 2.6*  ? ?CBC: ?Recent Labs  ?Lab 10/26/21 ?0159 10/27/21 ?0263 10/28/21 ?0251 10/29/21 ?0152 10/29/21 ?1322 10/30/21 ?0123  ?WBC 11.7* 10.6* 10.7* 11.4*  --  10.1  ?NEUTROABS  --  7.1 6.7 7.0  --  6.2  ?HGB 7.6* 8.0* 8.2* 6.7* 8.5* 8.2*  ?HCT 23.8* 24.2* 24.5* 20.6* 25.8* 25.3*  ?MCV 100.8* 98.4 98.0 100.5*  --  97.3  ?PLT 252 226 210 205  --  211  ? ?Blood Culture ?   ?Component Value Date/Time  ? SDES BLOOD RIGHT HAND 10/27/2021 1038  ? SPECREQUEST  10/27/2021 1038  ?  BOTTLES DRAWN AEROBIC AND ANAEROBIC Blood Culture  adequate volume  ? CULT  10/27/2021 1038  ?  NO GROWTH 3 DAYS ?Performed at [facility], [place] [address], [place], [place] [address] ?  ? REPTSTATUS PENDING 10/27/2021 1038  ? ? ?CBG: ?Recent Labs  ?Lab 10/29/21 ?1603 10/29/21 ?2008 10/30/21 ?0106 10/30/21 ?5027 10/30/21 ?0757  ?[place] ?Iron Studies:  ?Recent Labs  ?  10/28/21 ?1445  ?IRON 27*  ?TIBC 246*  ?FERRITIN 715*  ? ?Lab Results  ?Component Value Date  ? INR 1.3 (H) 10/18/2021  ? INR 1.1 10/03/2021  ? INR 1.2 11/12/2019  ? ?Studies/Results: ?DG CHEST PORT 1 VIEW ? ?Result Date: 10/30/2021 ?CLINICAL DATA:  Shortness of breath, cough, trach EXAM: PORTABLE CHEST 1 VIEW COMPARISON:  CT October 29, 2021 and radiograph October 28, 2021 FINDINGS: The heart size and mediastinal contours are enlarged, unchanged. Low lung volumes with dependent atelectasis. No visible pleural effusion or pneumothorax. The visualized skeletal structures are unchanged. IMPRESSION: Low lung volumes with dependent atelectasis. No visible pleural effusion or pneumothorax. Electronically Signed  By: Dahlia Bailiff M.D.   On: 10/30/2021 08:07  ? ?CT CHEST ABDOMEN PELVIS WO CONTRAST ? ?Result Date: 10/29/2021 ?CLINICAL DATA:  51 year old female with sepsis. EXAM: CT CHEST, ABDOMEN AND PELVIS WITHOUT CONTRAST TECHNIQUE: Multidetector CT imaging of the chest, abdomen and pelvis was performed following the standard protocol without IV contrast. RADIATION DOSE REDUCTION: This exam was performed according to the departmental dose-optimization program which includes automated exposure control, adjustment of the mA and/or kV according to patient size and/or use of iterative reconstruction technique. COMPARISON:  10/17/2020 CT and prior studies FINDINGS: Please note that parenchymal and vascular abnormalities may be missed as intravenous contrast was not administered. CT CHEST FINDINGS Cardiovascular: Cardiomegaly and coronary stents again noted. There is no evidence of  thoracic aortic aneurysm or pericardial effusion. Mediastinum/Nodes: Tracheostomy tube and small bore feeding tube noted. Visualized thyroid gland and esophagus are otherwise unremarkable. No mediastinal mass or suspicious lymph nodes. Lungs/Pleura: Streaky opacities within both posterior RIGHT UPPER lobe and SUPERIOR segment RIGHT LOWER lobe noted, favor atelectasis over infection/pneumonia. Mild to moderate bilateral LOWER lobe atelectasis is also identified. There is no evidence of pulmonary mass, nodule, pleural effusion or pneumothorax. Musculoskeletal: No acute abnormality. CT ABDOMEN PELVIS FINDINGS Hepatobiliary: No hepatic abnormalities are identified on this noncontrast study. Cholelithiasis noted without CT evidence of acute cholecystitis. There is no evidence of intrahepatic or extrahepatic biliary dilatation. Pancreas: Unremarkable Spleen: Splenomegaly is unchanged. Adrenals/Urinary Tract: Slightly atrophic kidneys again noted. There is no evidence of hydronephrosis or definite renal mass. The adrenal glands and bladder are unremarkable. Stomach/Bowel: A small bore feeding tube is noted with tip in the distal stomach. There is no evidence of bowel obstruction, definite bowel wall thickening or bowel inflammatory changes. The appendix is normal. Vascular/Lymphatic: Aortic atherosclerosis. Heavy arterial vascular calcifications are noted. No enlarged abdominal or pelvic lymph nodes. Reproductive: Uterus and bilateral adnexa are unremarkable. Other: No ascites, focal collection or pneumoperitoneum. Scattered oval densities within the anterior low pelvic subcutaneous fat most likely represent injection sites. Musculoskeletal: No acute or suspicious bony abnormalities are noted. Unchanged chronic L1 vertebral body fracture with sclerosis and lytic resorption again noted. IMPRESSION: 1. Streaky opacities within both posterior RIGHT UPPER lobe and SUPERIOR segment RIGHT LOWER lobe, favor atelectasis over  infection/pneumonia. Mild to moderate bilateral LOWER lobe atelectasis. 2. No evidence of acute abnormality within the abdomen or pelvis. 3. Cardiomegaly and coronary artery disease. 4. Cholelithiasis without CT evidence of acute cholecystitis. 5. Unchanged splenomegaly. 6. Aortic Atherosclerosis (ICD10-I70.0). Electronically Signed   By: Margarette Canada M.D.   On: 10/29/2021 13:25   ? ?Medications: ? sodium chloride Stopped (10/19/2021 2020)  ? feeding supplement (NEPRO CARB STEADY) 45 mL/hr at 10/29/21 1145  ? ? aspirin  81 mg Per Tube Daily  ? atorvastatin  80 mg Per Tube q1800  ? calcitRIOL  0.25 mcg Per Tube Q T,Th,Sat-1800  ? carvedilol  6.25 mg Per Tube BID WC  ? chlorhexidine gluconate (MEDLINE KIT)  15 mL Mouth Rinse BID  ? Chlorhexidine Gluconate Cloth  6 each Topical Q0600  ? Chlorhexidine Gluconate Cloth  6 each Topical Q0600  ? cholestyramine  4 g Per Tube TID  ? darbepoetin (ARANESP) injection - DIALYSIS  150 mcg Intravenous Q Thu-HD  ? ezetimibe  10 mg Per Tube Daily  ? feeding supplement (PROSource TF)  45 mL Per Tube TID  ? fiber  1 packet Per Tube TID  ? folic acid  1 mg Per Tube Daily  ? gabapentin  300 mg Per Tube Q8H  ? heparin injection (subcutaneous)  5,000 Units Subcutaneous Q8H  ? hydrALAZINE  50 mg Per Tube BID  ? insulin aspart  0-15 Units Subcutaneous Q4H  ? insulin aspart  5 Units Subcutaneous Q4H  ? insulin detemir  15 Units Subcutaneous BID  ? lacosamide  50 mg Per Tube BID  ? leptospermum manuka honey  1 application. Topical UD  ? levothyroxine  50 mcg Per Tube QAC breakfast  ? mouth rinse  15 mL Mouth Rinse 10 times per day  ? multivitamin  1 tablet Per Tube QHS  ? pantoprazole sodium  40 mg Per Tube Daily  ? QUEtiapine  50 mg Per Tube BID  ? thiamine  100 mg Per Tube Daily  ? vitamin B-12  1,000 mcg Per Tube Daily  ? ? ?Dialysis Orders: ?TTS [facility] ?  3h 33mn  75.5kg  2/2.5 bath  400 bfr   Hep none LUE AVF ? - 4/14 > hep B Ag negative and hep B Ab's low/ not protective ? -  mircera 200 q2 wks ? - venofer 50 weekly ? - rocaltrol 0.25 tiw ?  ?Assessment/Plan: ?AMS: Found unresponsive at home. Had PRES and seizures, seen by neurology. Repeat MRI improved but mental status remains poor.

## 2021-10-30 NOTE — Progress Notes (Signed)
ATC setup changed 

## 2021-10-31 ENCOUNTER — Inpatient Hospital Stay (HOSPITAL_COMMUNITY): Payer: Medicaid Other

## 2021-10-31 DIAGNOSIS — G9340 Encephalopathy, unspecified: Secondary | ICD-10-CM | POA: Diagnosis not present

## 2021-10-31 DIAGNOSIS — R4182 Altered mental status, unspecified: Secondary | ICD-10-CM | POA: Diagnosis not present

## 2021-10-31 DIAGNOSIS — R509 Fever, unspecified: Secondary | ICD-10-CM | POA: Diagnosis not present

## 2021-10-31 DIAGNOSIS — I6783 Posterior reversible encephalopathy syndrome: Secondary | ICD-10-CM | POA: Diagnosis not present

## 2021-10-31 DIAGNOSIS — R403 Persistent vegetative state: Secondary | ICD-10-CM | POA: Diagnosis not present

## 2021-10-31 DIAGNOSIS — Z7189 Other specified counseling: Secondary | ICD-10-CM | POA: Diagnosis not present

## 2021-10-31 DIAGNOSIS — N186 End stage renal disease: Secondary | ICD-10-CM | POA: Diagnosis not present

## 2021-10-31 LAB — PHOSPHORUS: Phosphorus: 5.1 mg/dL — ABNORMAL HIGH (ref 2.5–4.6)

## 2021-10-31 LAB — COMPREHENSIVE METABOLIC PANEL
ALT: 31 U/L (ref 0–44)
AST: 32 U/L (ref 15–41)
Albumin: 2.5 g/dL — ABNORMAL LOW (ref 3.5–5.0)
Alkaline Phosphatase: 165 U/L — ABNORMAL HIGH (ref 38–126)
Anion gap: 15 (ref 5–15)
BUN: 91 mg/dL — ABNORMAL HIGH (ref 6–20)
CO2: 20 mmol/L — ABNORMAL LOW (ref 22–32)
Calcium: 8.6 mg/dL — ABNORMAL LOW (ref 8.9–10.3)
Chloride: 99 mmol/L (ref 98–111)
Creatinine, Ser: 7 mg/dL — ABNORMAL HIGH (ref 0.44–1.00)
GFR, Estimated: 7 mL/min — ABNORMAL LOW (ref >60)
Glucose, Bld: 146 mg/dL — ABNORMAL HIGH (ref 70–99)
Potassium: 3.7 mmol/L (ref 3.5–5.1)
Sodium: 134 mmol/L — ABNORMAL LOW (ref 135–145)
Total Bilirubin: 0.6 mg/dL (ref 0.3–1.2)
Total Protein: 6.4 g/dL — ABNORMAL LOW (ref 6.5–8.1)

## 2021-10-31 LAB — CBC WITH DIFFERENTIAL/PLATELET
Abs Immature Granulocytes: 0.12 10*3/uL — ABNORMAL HIGH (ref 0.00–0.07)
Basophils Absolute: 0 10*3/uL (ref 0.0–0.1)
Basophils Relative: 0 %
Eosinophils Absolute: 0.6 10*3/uL — ABNORMAL HIGH (ref 0.0–0.5)
Eosinophils Relative: 6 %
HCT: 24.8 % — ABNORMAL LOW (ref 36.0–46.0)
Hemoglobin: 8.1 g/dL — ABNORMAL LOW (ref 12.0–15.0)
Immature Granulocytes: 1 %
Lymphocytes Relative: 13 %
Lymphs Abs: 1.3 10*3/uL (ref 0.7–4.0)
MCH: 31.6 pg (ref 26.0–34.0)
MCHC: 32.7 g/dL (ref 30.0–36.0)
MCV: 96.9 fL (ref 80.0–100.0)
Monocytes Absolute: 1.4 10*3/uL — ABNORMAL HIGH (ref 0.1–1.0)
Monocytes Relative: 14 %
Neutro Abs: 6.7 10*3/uL (ref 1.7–7.7)
Neutrophils Relative %: 66 %
Platelets: 207 10*3/uL (ref 150–400)
RBC: 2.56 MIL/uL — ABNORMAL LOW (ref 3.87–5.11)
RDW: 18 % — ABNORMAL HIGH (ref 11.5–15.5)
WBC: 10.1 10*3/uL (ref 4.0–10.5)
nRBC: 0 % (ref 0.0–0.2)

## 2021-10-31 LAB — GLUCOSE, CAPILLARY
Glucose-Capillary: 142 mg/dL — ABNORMAL HIGH (ref 70–99)
Glucose-Capillary: 143 mg/dL — ABNORMAL HIGH (ref 70–99)
Glucose-Capillary: 153 mg/dL — ABNORMAL HIGH (ref 70–99)
Glucose-Capillary: 164 mg/dL — ABNORMAL HIGH (ref 70–99)
Glucose-Capillary: 98 mg/dL (ref 70–99)

## 2021-10-31 LAB — MAGNESIUM: Magnesium: 2.5 mg/dL — ABNORMAL HIGH (ref 1.7–2.4)

## 2021-10-31 IMAGING — DX DG ABD PORTABLE 1V
1 series · 1 of 1 positions shown · non-contrast
Comparison: None.

CLINICAL DATA: NG tube placement

EXAM:
PORTABLE ABDOMEN - 1 VIEW

[abdomen]
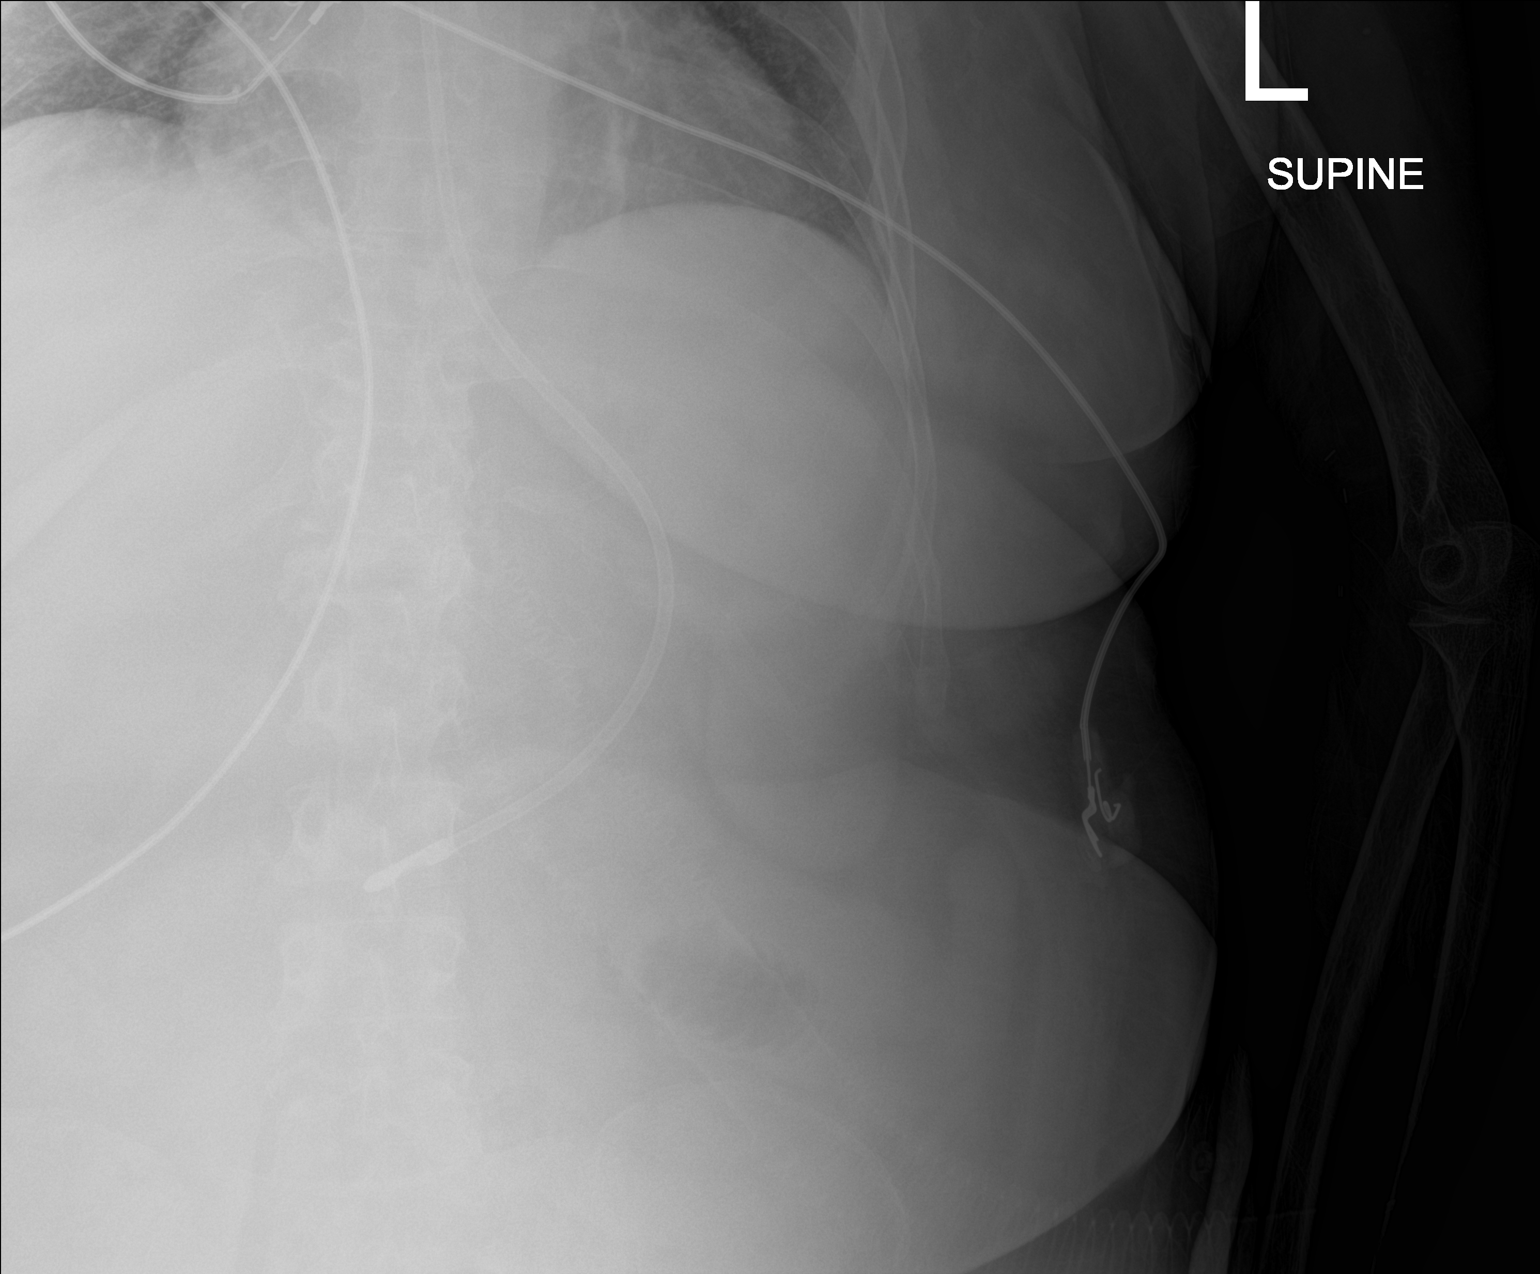

[1 of 1 positions shown; findings below may reference images not displayed]

FINDINGS: Gastric tube with tip in the gastric antrum.
IMPRESSION: Gastric tube with tip in the gastric antrum

## 2021-10-31 IMAGING — DX DG CHEST 1V PORT
1 series · 1 of 1 positions shown · non-contrast
Comparison: Chest radiograph 1 day prior, CT chest [DATE]

CLINICAL DATA: Shortness of breath

EXAM:
PORTABLE CHEST 1 VIEW

[chest]
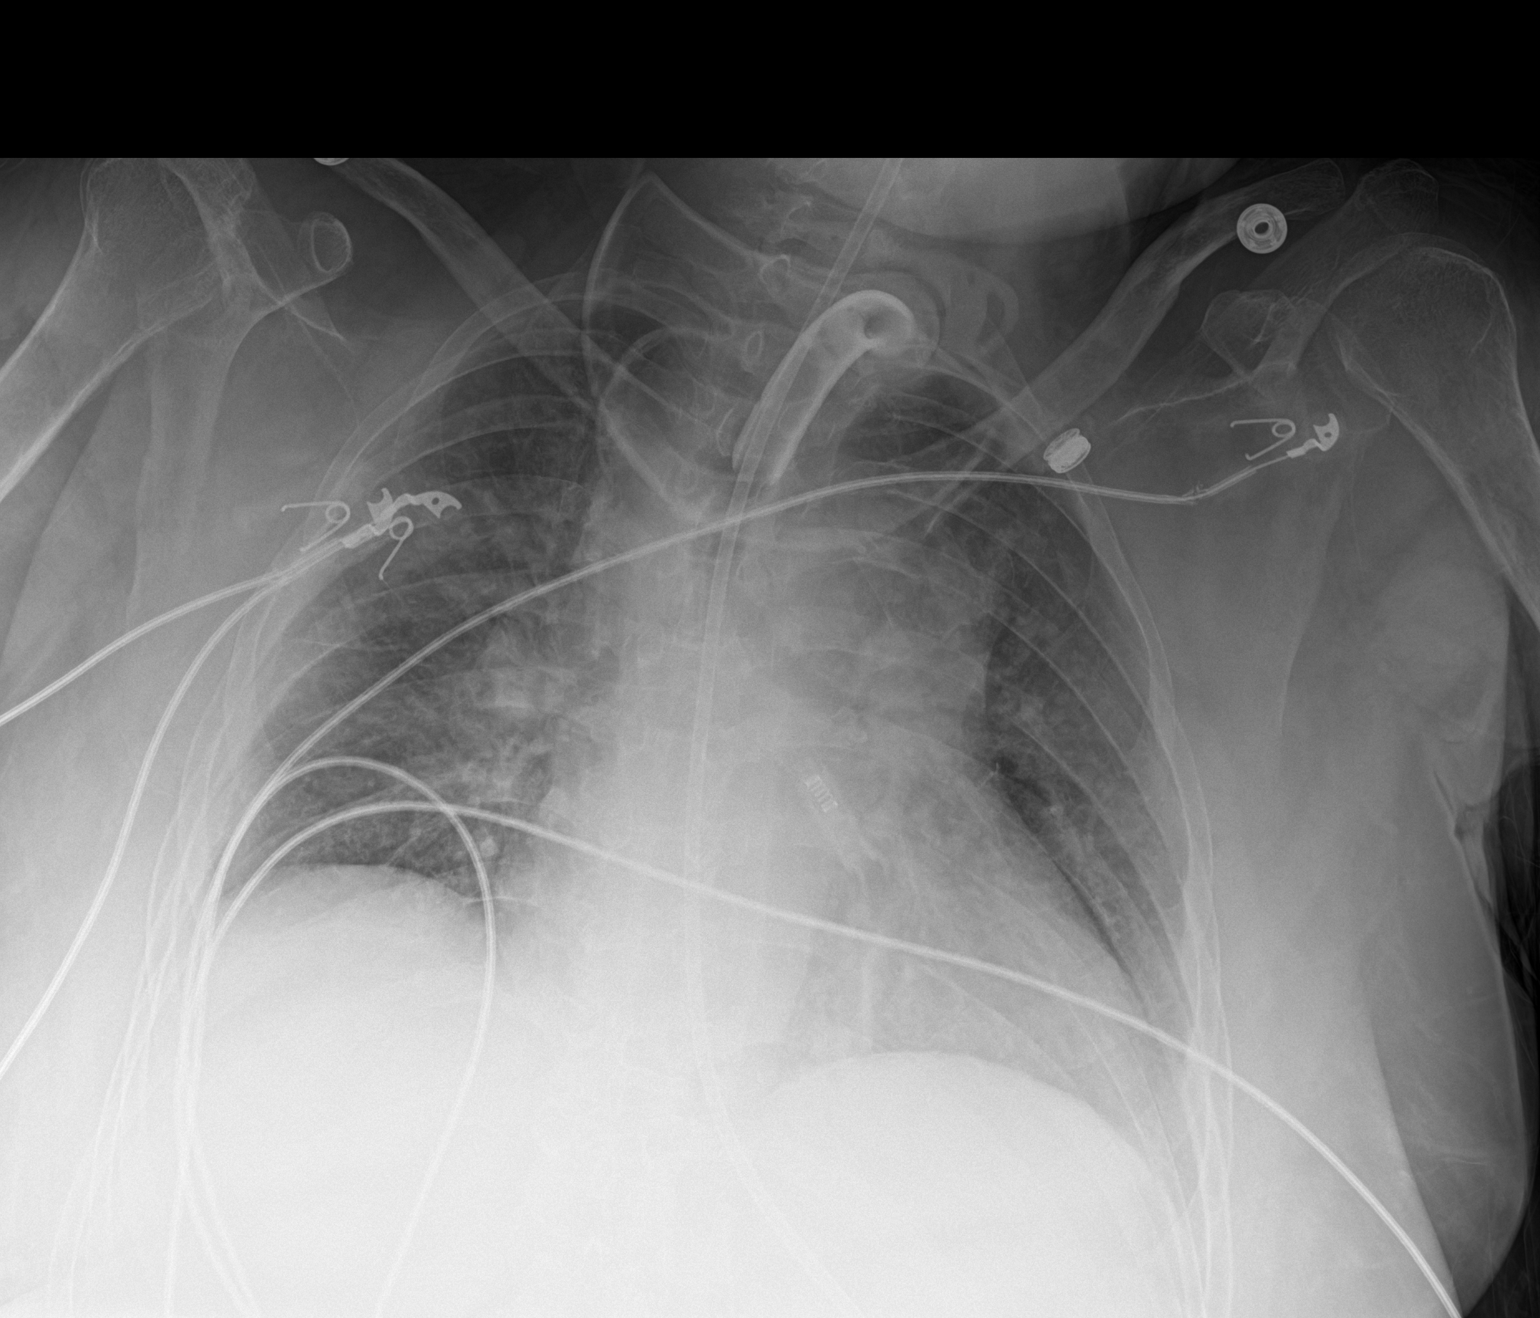

[1 of 1 positions shown; findings below may reference images not displayed]

FINDINGS: The tracheostomy tube is stable. The enteric catheter tip is off the
field of view.

Heart is enlarged with prominent upper mediastinal contours,
unchanged.

Lung volumes are low. There is no new or worsening focal airspace
disease. There is no significant pleural effusion. There is no
appreciable pneumothorax

The bones are stable.
IMPRESSION: Low lung volumes. Otherwise, no radiographic evidence of acute
cardiopulmonary process. No significant interval change since the
study from 1 day prior.

## 2021-10-31 MED ORDER — LORAZEPAM 2 MG/ML IJ SOLN
2.0000 mg | Freq: Once | INTRAMUSCULAR | Status: AC
  Administered 2021-10-31: 2 mg via INTRAVENOUS

## 2021-10-31 MED ORDER — LORAZEPAM 2 MG/ML IJ SOLN
INTRAMUSCULAR | Status: AC
  Filled 2021-10-31: qty 1

## 2021-10-31 MED ORDER — LORAZEPAM 2 MG/ML IJ SOLN: 1.0000 mg | Freq: Once | INTRAMUSCULAR | Status: DC

## 2021-10-31 MED ORDER — SODIUM CHLORIDE 0.9 % IV SOLN
125.0000 mg | INTRAVENOUS | Status: DC
  Administered 2021-10-31: 125 mg via INTRAVENOUS
  Filled 2021-10-31 (×2): qty 10

## 2021-10-31 NOTE — Progress Notes (Addendum)
?PROGRESS NOTE ? ? ? ?Melanie Hall  MHD:[id number] DOB: 07/01/1971 DOA: 09/16/2021 ?PCP: Renee Rival, FNP  ? ?Brief Narrative:  ?The patient is a 51 year old former smoker with past medical history significant for but not limited to history of ESRD on hemodialysis, CHF, anemia, osteoarthritis, history of CAD, history of diabetes mellitus type 2, hypertension, hyperlipidemia, history of CVA as well as other comorbidities who was found at her home by her patient transport driver to be unresponsive.  Patient's neighbor noted that she was tapping her hands for about a minute and then stop.  She was snoring and drooling and had no improvement after Narcan was given.  She is noted to have a normal CBG and blood pressure was 215/96 on admission.  She was intubated in a.m. pain hospital for airway protection, given a Keppra load and started on dipper Van and started on antibiotics.  Head CT was negative for any acute findings and she was treated for hyperkalemia.  She did similar episode on 09/24/2021 when EMS was called to the house but the episode resolved and patient declined hospital evaluation.  She had no prior history of seizures or known head injury and the ED team discussed with neurology and they advised the patient to be transferred to [facility] for EEG monitoring and further neurology assessment. ? ?Current significant hospital events are as below:  ?Significant Hospital Events: ?Including procedures, antibiotic start and stop dates in addition to other pertinent events   ?3/29 present to [facility], intubated, transfer to [facility] ?3/30 MRI Brain consistent with PRES ?4/3 off sedation, not waking up ?4/4 transitioned off insulin gtt to SSI ?4/5 [place] conversation; see IPAL note ?4/8 MRI with no acute findings ?4/11 trial of extubation failed, she was reintubated, bedside tracheostomy was attempted but failed ?4/12 CT neck was done, negative for acute findings ?4/15-tracheostomy, performed by ENT ?4/16 trach  collar ?4/18: TCT overnight and yesterday; added seroquel and clonidine taper; weaned off precedex at 5pm ?4/19: remains on TCT >48 hours  ?4/20: Transferred to [place] and underwent Dialysis again.  ?4/21: Has not changed with her mental status and had some bloody secretions from her trachea.  Palliative care has been consulted given that she is unresponsive.  Continues to be on 5 L and on Cortrak.  We will need to discuss about PEG tube options of SNF placement however there are several barriers given patient's insurance and no dialysis centers that take trachs ?4/22: Palliative Care Discussions being held and patient made DNR and she underwent Dialysis again; PEG tube discussions will be held further if no improvement in speech therapy but patient daughter will allow more time to see if the patient makes any improvement ?4/23- Blood count dropped to 6.7 so will be transfused 1 unit of pRBCs. Palliative continues conversations and Daughter would pursue PEG. Continues to have Fevers so will pan-scan but neurology suspects that she may have some hypoxic injury is not showing up on MRI and feels that this could be indicative of her low intermittent fevers ?4/24 -still not as responsive but she remains off of Precedex.  Seroquel and clonidine are being tapered.  Neurology is following and she continues to spike intermittent fevers but neurology feels that it could be from DVTs so we will order bilateral upper extremity and lower extremity duplexes to evaluate.  Per pulmonary is okay to remove sutures this morning and change gauze dressing.  The plan was to have a conference call with the patient daughter  but daughter did not pick up the phone as she did not yesterday.  Nephrology is following and plan is for next HD session on 10/31/2021 neurology recommending continuing Vimpat and tapering off after June.  Neurology feels that she does not have any purposeful movements ?4/25-hemodialysis today and she was  significantly agitated so she had to be given Ativan.  Still not awake and continues to have nonpurposeful movements; we will obtain a KUB to ensure proper Cortrak placement given that her tube was at ~58 cm marking and it was documented at 66 cm. Upper and LE Venous Duplex to be done today.  ? ?Assessment and Plan: ? ?PRES syndrome with associated status epilepticus ?Acute metabolic encephalopathy secondary to PRES ?-Last MRI did show resolution of findings consistent with press ?-precedex off 4/18 ?-seroquel and clonidine taper started 4/18 ?-cont vimpat and provigil per neuro for seizure ppx ?-cont gabapentin and seroquel  ?-C/w IV Ativan 2 mg for clinical Seziure-like activity ?-frequent neurochecks; seizure precaution in place ?-Continues to be significantly encephalopathic and was not responsive to examination again this morning to me but neurology reevaluated and Dr. Hortense Ramal feels that the patient does have more improvement in her exam compared to her last exam on 10/26/2018 given that she opens her eyes and has more movement in the bilateral lower extremities however these movements do not appear purposeful ?-10/31/21 She was very agitated during Dialysis requiring Ativan; Continues to be Encephalopathic  ?-Neurology following and she was getting Provigil to promote wakefulness ?-SLP evaluated and recommending NPO given that she demonstrates no functional ability to swallow ?-Palliative Care consulted for [place] Discussions and she is now DNR. Daughter would allow for time to see if patient improves and Discussions about PEG are happening but Daughter is ok with PEG to be placed; Palliative asking Neurology Dr. Hortense Ramal weigh in again given concern that patient will continue to deteriorate in the long run and Dr. Hortense Ramal recommends continuing Vimpat 50 mg p.o. twice daily and considering PEG placement and monitoring for another couple months if patient's family would like to pursue aggressive care ?-If she continues to  be agitated will consult Psych for further evaluation and reocmmendations ?  ?Acute respiratory failure secondary to probable aspiration pneumonia S/p tracheostomy because of Prolonged Vent Dependence due to Encephalopathy ?-Continue trach collar as tolerated; has been on for >48 hours ?-prn tracheal suction ?-trach care per protocol routine trach care; RRT discussed about patient's trach sutures and the ability to change the trach gauze and clean around the stoma but pulmonary prefers for the RT to wait until the weekend to remove the trach sutures ?-Recommending change to cuffless trach per ENT recommendations and pulmonary saw today and deflated her trach cuff and recommending continue pulmonary hygiene and minimizing suction as able and continue trach care.  There feels that she is safe to have her sutures removed today and have her dressing changed ?-SpO2: 94 % ?O2 Flow Rate (L/min): 5 L/min ?FiO2 (%): 28 %; Continue supplemental oxygen via ATC to maintain O2 saturations greater than 90% ?-Continue supportive care: Palliative care consulted for goals of care discussion ?-She has had a Cortrak in place and will need to change out to either a PEG and will have further goals of care discussion the patient's family and family likely leaning towards PEG Placement with further GOC discussion ? ?End Stage Renal Disease on hemodialysis Tuesday Thursday Saturday ?Hyponatremia ?Hyperphosphatemia ?Hypermagnesemia ?Metabolic Acidosis ?-cont iHD and she underwent Dialysis yesterday ?-Trend BMP / urinary output ?-  Replace electrolytes as indicated ?-Patient's BUNs/creatinine is now 63/4.94 -> 41/3.67 -> 65/5.31 -> 71/5.85 -> 86/6.56 -> 91/7.00 ?-Sodium went from 133 -> 134 -> 131 -> 134 and improved to 136 yesterday and then it is 134 x2 ?-Patient's Mag Level is now 2.5 and Phos Level is 5.1  ?-Patient has a Metabolic Acidosis with a CO2 of 20, AG of 15, and Chloride Level of 99 today  ?-Avoid nephrotoxic agents, contrast  dyes, ensure adequate renal perfusion to prevent dehydration and hypoperfusion and renally dose medications ?-Nephrology is following and plan is for HD on 10/31/2021 ?-Repeat CMP, Mag, Phos in a.m. ?  ?Hypokalemia ?-Patie

## 2021-10-31 NOTE — Progress Notes (Signed)
? ?                                                                                                                                                     ?                                                   ?Daily Progress Note  ? ?Patient Name: Melanie Hall       Date: 10/31/2021 ?DOB: 01-03-1971  Age: 51 y.o. MRN#: [id number] ?Attending Physician: Kerney Elbe, DO ?Primary Care Physician: Renee Rival, FNP ?Admit Date: 09/26/2021 ? ?Reason for Consultation/Follow-up: Establishing goals of care ? ?Patient Profile/HPI:  The patient is a 51 year old former smoker with past medical history significant for but not limited to history of ESRD on hemodialysis, CHF, anemia, osteoarthritis, history of CAD, history of diabetes mellitus type 2, hypertension, hyperlipidemia, history of CVA as well as other comorbidities who was found at her home by her patient transport driver to be unresponsive.  Patient's neighbor noted that she was tapping her hands for about a minute and then stop.  She was snoring and drooling and had no improvement after Narcan was given.  She is noted to have a normal CBG and blood pressure was 215/96 on admission.  She was intubated in a[facility] pain hospital for airway protection, given a Keppra load and started on dipper Van and started on antibiotics.  Head CT was negative for any acute findings and she was treated for hyperkalemia.  She did similar episode on 09/24/2021 when EMS was called to the house but the episode resolved and patient declined hospital evaluation.  She had no prior history of seizures or known head injury and the ED team discussed with neurology and they advised the patient to be transferred to [facility] for EEG monitoring and further neurology assessment. MRI on 3/30 indicated PRES. She has remained minimally responsive- s/p trach. Cortrack is in. There may be a component of ischemic brain injury. ?  ?Palliative care has been asked to get involved to have further goals of  care conversations.  ? ?Subjective: ?Chart reviewed including labs, progress notes, imaging.  ?Evaluated patient. She did not respond to my voice- did not follow commands.  ?Per chart review she did have some nonpurposeful movement of upper extremities over the last day. She was significantly agitated during HD and required sedation.  ?Spoke with daughter [name] by phone.  ?Reviewed medical update.  ?Discussed GOC and medical choices within the context of what patient's overall goals of care would be.  ?Shared with [name] Dr. Karolee Stamps recommendations- if patient has expressed that she would not want to live a life  with ongoing assistance with ADLs- dependency for care- would not recommend PEG> However- if patient's values are such that she would be tolerable of long term care and disability- then recommend PEG tube and long term care. Patient is not likely to regain independent living.  ?[name] shared that patient would not want to be permanently dependant for care- but would be willing to tolerate some level of assistance if she had the possibility of returning to how she was prior to this incident. I shared mine and other providers concerns that Antia is not going to return to an independent functional state  ?Also discussed worries about her ability to receive dialysis.  ?Emotional support provided as [name] shared her worries about having an adequate amount of time to make the right decision for her mom. At this point [name] is feeling that continuing ongoing life supporting care and giving her mom more time for recovery is the path that she would choose. She would like to have her sister's involved and receive information update from providers.  ? ? ? ?Review of Systems  ?Unable to perform ROS: Mental status change  ? ? ?Physical Exam ?Vitals and nursing note reviewed.  ?Neurological:  ?   Comments: Bil upper extremities contracted  ?         ? ?Vital Signs: BP (!) 92/41   Pulse (!) 102   Temp 99.8 ?F (37.7  ?C) (Oral)   Resp 18   Ht '5\' 3"'$  (1.6 m)   Wt 78.5 kg   LMP 12/17/2016   SpO2 94%   BMI 30.66 kg/m?  ?SpO2: SpO2: 94 % ?O2 Device: O2 Device: Tracheostomy Collar ?O2 Flow Rate: O2 Flow Rate (L/min): 5 L/min ? ?Intake/output summary:  ?Intake/Output Summary (Last 24 hours) at 10/31/2021 1710 ?Last data filed at 10/31/2021 1300 ?Gross per 24 hour  ?Intake 120 ml  ?Output 1352 ml  ?Net -1232 ml  ? ?LBM: Last BM Date : 10/27/21 ?Baseline Weight: Weight: 83.9 kg ?Most recent weight: Weight: 78.5 kg ? ?     ?Palliative Assessment/Data: PPS: 10% ? ? ? ? ? ?Patient Active Problem List  ? Diagnosis Date Noted  ? Acute encephalopathy   ? Goals of care, counseling/discussion   ? PRES (posterior reversible encephalopathy syndrome)   ? ESRD (end stage renal disease) ([place])   ? Acute respiratory failure ([place])   ? Secondary hypertension   ? Type 2 diabetes mellitus with hyperglycemia ([facility])   ? Pressure injury of skin 10/05/2021  ? Altered mental status 10/01/2021  ? Swelling of left lower extremity 09/20/2021  ? CAD (coronary artery disease) 02/17/2021  ? Ischemic cardiomyopathy 02/17/2021  ? S/P ankle fusion 06/01/2020  ? Pilon fracture of left tibia, closed, initial encounter   ? Displaced trimalleolar fracture of left lower leg, initial encounter for closed fracture   ? Abnormality of gait and mobility 05/26/2020  ? Migraine 05/26/2020  ? Diabetic retinopathy ([place]) 05/04/2020  ? Chronic diarrhea of unknown origin 01/14/2020  ? Type 2 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, with long-term current use of insulin ([place]) 12/29/2019  ? Hypothyroidism 12/29/2019  ? Weakness of both lower extremities 11/12/2019  ? Debility 09/10/2019  ? ESRD (end stage renal disease) on dialysis ([place]) 09/10/2019  ? Depression 09/10/2019  ? Incontinence in female 09/10/2019  ? Anemia in chronic kidney disease 08/19/2019  ? MGUS (monoclonal gammopathy of unknown significance) 07/23/2019  ? GERD (gastroesophageal reflux  disease) 02/28/2019  ? Essential hypertension 10/28/2018  ?  Irregular menses 09/14/2009  ? Hypercholesterolemia 11/19/2008  ? Type 2 diabetes with nephropathy ([place]) 07/02/2007  ? ? ?Palliative Care Assessment & Plan  ? ? ?Assessment/Recommendations/Plan ? ?Continue current plan- full scope, DNR ?Plan to meet via conference call tomorrow at 1pm with [name] and her sisters, Evaristo Bury will attend, Dr. Chana Bode notified but he is going off service- will notify new attending provider in the morning ? ? ?Code Status: ?DNR ? ?Prognosis: ? Unable to determine ? ?Discharge Planning: ?To Be Determined ? ? ?   ?Greater than 50%  of this time was spent counseling and coordinating care related to the above assessment and plan. ? ?Mariana Kaufman, AGNP-C ?Palliative Medicine ? ? ?Please contact Palliative Medicine Team phone at [phone] for questions and concerns.  ? ? ? ? ? ? ?

## 2021-10-31 NOTE — Progress Notes (Signed)
Nutrition Follow-up ? ?DOCUMENTATION CODES:  ? ?Not applicable ? ?INTERVENTION:  ? ?Tube feeding via Cortrak tube: ?Continue Nepro @ 45 mL/hr (1080 mL/day) ?ProSource TF 45 mL - TID ?30 mL free water q4h for tube maintenance  ?Tube feeding regimen provides 2064 kcal, 120 grams of protein, and 785 mL of free water (965 mL total free water) daily.  ? ?Continue Renal MVI daily per tube ? ?NUTRITION DIAGNOSIS:  ? ?Inadequate oral intake related to inability to eat as evidenced by NPO status. - Ongoing, being addressed via TF ? ?GOAL:  ? ?Patient will meet greater than or equal to 90% of their needs - Met via TF ? ?MONITOR:  ? ?Labs, Weight trends, I & O's, Skin ? ?REASON FOR ASSESSMENT:  ? ?Ventilator, Consult ?Enteral/tube feeding initiation and management ? ?ASSESSMENT:  ? ?51-year-old female who presented to the ED on 3/29 with seizure-like activity. PMH of ESRD on HD, CHF, anemia, CAD, T2DM, HLD, HTN, CVA, GERD. Pt required intubation for airway protection. Pt admitted with acute metabolic encephalopathy, probable aspiration PNA. ? ?03/30 - tube feeding protocol started ?04/11 - tracheostomy attempted but unsuccessful ?04/12 - Cortrak placed ?04/15 - s/p tracheostomy in OR with ENT ? ?Pt resting in bed, does not respond to vocal stimulation.  ? ?Tube feeds currently infusing via Cortrak. RD notice Cortrak was at different centimeter marking than what was documented. Reached out to MD to confirm placement. ? ?Medications reviewed and include: Calcitriol, Aranesp, Fiver, Folic Acid, SSI 0-15 units q4h + 5 units q4h, Levemir, Rena-vit, Protonix, Thiamine, Vitamin B12, IV ferric gluconate ?Labs reviewed: Sodium 134, Phosphorus 5.1, Magnesium 2.5, 24 hr CBG 117-164 ? ?HD on 4/25 ?Net UF: 1352 mL ? ?Diet Order:   ?Diet Order   ? ?       ?  Diet NPO time specified  Diet effective now       ?  ? ?  ?  ? ?  ? ? ?EDUCATION NEEDS:  ? ?Not appropriate for education at this time ? ?Skin:  Skin Assessment: ?Skin Integrity  Issues: ?DTI: R foot toe ?Stage I: sacrum ?Stage III: anus ?Incisions: neck ?Other: MASD pelvis ? ?Last BM:  4/25 - Type 4 ? ?Height:  ? ?Ht Readings from Last 1 Encounters:  ?09/21/2021 5' 3" (1.6 m)  ? ? ?Weight:  ? ?Wt Readings from Last 1 Encounters:  ?10/31/21 78.5 kg  ? ? ?Ideal Body Weight:  52.3 kg ? ?BMI:  Body mass index is 30.66 kg/m?. ? ?Estimated Nutritional Needs:  ? ?Kcal:  2000-2200 ? ?Protein:  115-135 grams ? ?Fluid:  1000 ml + UOP ? ? ?  RD, LDN ?Clinical Dietitian ?See AMiON for contact information.  ? ? ?

## 2021-10-31 NOTE — Progress Notes (Signed)
Lower extremity venous bilateral and upper extremity venous bilateral study completed.  ? ?Please see CV Proc for preliminary results.  ? ?Darlin Coco, RDMS, RVT ? ?

## 2021-10-31 NOTE — Procedures (Signed)
Arrived to Hemodialysis with BP of 127/71, Pt was agitated throughout the treatment she attempted to leave the bed and it required more than 2 RN's to hold her in place until an order for Ativan was received.  Attending gave order for '2mg'$  of Ativan which was immediately given.  She continued to make attempts to leave the bed and as such she had numerous alarms on her hemodialysis machine.  Due to her frequent alarms she clotted her dialyzer with only 12 min left on tx.  All her blood was returned and she remained her vital signs remained stable.  Melanie Hall was then decannulated and pressure was held until hemostasis occurred.  Attempts were made to contact her bedside RN and the Charge RN, however both were busy with other patients.  Secure Chat messaging was utilized to give report.  RN was notified to please contact the Hemodialysis Department if she had further questions or concerns. ?

## 2021-10-31 NOTE — Progress Notes (Addendum)
?[facility] ?Progress Note  ? ?Subjective:   Patient seen and examined at bedside in dialysis.  Tolerating well so far. Eyes open but non responsive.  Neuro does not believe she is having purposeful movements but reports some improvement in exam.  ? ?Objective ?Vitals:  ? 10/31/21 0743 10/31/21 0748 10/31/21 0754 10/31/21 0757  ?BP:   127/71 (!) 133/93  ?Pulse:  95 93 94  ?Resp:  (!) _0 ?Temp: (!) 97.5 ?F (36.4 ?C)     ?TempSrc: Axillary     ?SpO2:      ?Weight: 79.8 kg     ?Height:      ? ?Physical Exam ?General:chronically ill appearing female in NAD ?Heart:RRR, no mrg ?Lungs:mostly CTAB, nml WOB on trace to O2 ?Abdomen:soft, NTND ?Extremities:trace LE edema ?Dialysis Access: LU AVF cannulated   ? ?Filed Weights  ? 10/26/21 1017 10/26/21 1400 10/31/21 0743  ?Weight: 90 kg 88.3 kg 79.8 kg  ? ?No intake or output data in the 24 hours ending 10/31/21 0811 ? ?Additional Objective ?Labs: ?Basic Metabolic Panel: ?Recent Labs  ?Lab 10/30/21 ?0741 10/30/21 ?2109 10/31/21 ?0123  ?NA 136 134* 134*  ?K 3.8 3.8 3.7  ?CL 99 99 99  ?CO2 24 21* 20*  ?GLUCOSE 127* 177* 146*  ?BUN 71* 86* 91*  ?CREATININE 5.85* 6.56* 7.00*  ?CALCIUM 8.8* 8.4* 8.6*  ?PHOS 4.5 5.3* 5.1*  ? ?Liver Function Tests: ?Recent Labs  ?Lab 10/29/21 ?8882 10/30/21 ?0123 10/30/21 ?8003 10/30/21 ?2109 10/31/21 ?0123  ?AST 35 36  --   --  32  ?ALT 25 31  --   --  31  ?ALKPHOS 133* 165*  --   --  165*  ?BILITOT 0.4 0.3  --   --  0.6  ?PROT 5.9* 6.6  --   --  6.4*  ?ALBUMIN 2.4* 2.5* 2.6* 2.4* 2.5*  ? ?CBC: ?Recent Labs  ?Lab 10/28/21 ?0251 10/29/21 ?0152 10/29/21 ?1322 10/30/21 ?0123 10/30/21 ?2109 10/31/21 ?0123  ?WBC 10.7* 11.4*  --  10.1 9.2 10.1  ?NEUTROABS 6.7 7.0  --  6.2  --  6.7  ?HGB 8.2* 6.7*   < > 8.2* 7.9* 8.1*  ?HCT 24.5* 20.6*   < > 25.3* 24.2* 24.8*  ?MCV 98.0 100.5*  --  97.3 97.6 96.9  ?PLT 210 205  --  211 209 207  ? < > = values in this interval not displayed.  ? ?Blood Culture ?   ?Component Value Date/Time  ? SDES  BLOOD RIGHT HAND 10/27/2021 1038  ? SPECREQUEST  10/27/2021 1038  ?  BOTTLES DRAWN AEROBIC AND ANAEROBIC Blood Culture adequate volume  ? CULT  10/27/2021 1038  ?  NO GROWTH 3 DAYS ?Performed at [facility], [place] [address], [place], [place] [address] ?  ? REPTSTATUS PENDING 10/27/2021 1038  ? ? ?CBG: ?Recent Labs  ?Lab 10/30/21 ?1155 10/30/21 ?1637 10/30/21 ?1951 10/31/21 ?0005 10/31/21 ?0432  ?GLUCAP 162* 117* 145* 142* 164*  ? ?Iron Studies:  ?Recent Labs  ?  10/28/21 ?1445  ?IRON 27*  ?TIBC 246*  ?FERRITIN 715*  ? ?Lab Results  ?Component Value Date  ? INR 1.3 (H) 10/18/2021  ? INR 1.1 10/06/2021  ? INR 1.2 11/12/2019  ? ?Studies/Results: ?DG CHEST PORT 1 VIEW ? ?Result Date: 10/30/2021 ?CLINICAL DATA:  Shortness of breath, cough, trach EXAM: PORTABLE CHEST 1 VIEW COMPARISON:  CT October 29, 2021 and radiograph October 28, 2021 FINDINGS: The heart size and mediastinal contours are  enlarged, unchanged. Low lung volumes with dependent atelectasis. No visible pleural effusion or pneumothorax. The visualized skeletal structures are unchanged. IMPRESSION: Low lung volumes with dependent atelectasis. No visible pleural effusion or pneumothorax. Electronically Signed   By: Dahlia Bailiff M.D.   On: 10/30/2021 08:07  ? ?CT CHEST ABDOMEN PELVIS WO CONTRAST ? ?Result Date: 10/29/2021 ?CLINICAL DATA:  51 year old female with sepsis. EXAM: CT CHEST, ABDOMEN AND PELVIS WITHOUT CONTRAST TECHNIQUE: Multidetector CT imaging of the chest, abdomen and pelvis was performed following the standard protocol without IV contrast. RADIATION DOSE REDUCTION: This exam was performed according to the departmental dose-optimization program which includes automated exposure control, adjustment of the mA and/or kV according to patient size and/or use of iterative reconstruction technique. COMPARISON:  10/17/2020 CT and prior studies FINDINGS: Please note that parenchymal and vascular abnormalities may be missed as intravenous contrast was not  administered. CT CHEST FINDINGS Cardiovascular: Cardiomegaly and coronary stents again noted. There is no evidence of thoracic aortic aneurysm or pericardial effusion. Mediastinum/Nodes: Tracheostomy tube and small bore feeding tube noted. Visualized thyroid gland and esophagus are otherwise unremarkable. No mediastinal mass or suspicious lymph nodes. Lungs/Pleura: Streaky opacities within both posterior RIGHT UPPER lobe and SUPERIOR segment RIGHT LOWER lobe noted, favor atelectasis over infection/pneumonia. Mild to moderate bilateral LOWER lobe atelectasis is also identified. There is no evidence of pulmonary mass, nodule, pleural effusion or pneumothorax. Musculoskeletal: No acute abnormality. CT ABDOMEN PELVIS FINDINGS Hepatobiliary: No hepatic abnormalities are identified on this noncontrast study. Cholelithiasis noted without CT evidence of acute cholecystitis. There is no evidence of intrahepatic or extrahepatic biliary dilatation. Pancreas: Unremarkable Spleen: Splenomegaly is unchanged. Adrenals/Urinary Tract: Slightly atrophic kidneys again noted. There is no evidence of hydronephrosis or definite renal mass. The adrenal glands and bladder are unremarkable. Stomach/Bowel: A small bore feeding tube is noted with tip in the distal stomach. There is no evidence of bowel obstruction, definite bowel wall thickening or bowel inflammatory changes. The appendix is normal. Vascular/Lymphatic: Aortic atherosclerosis. Heavy arterial vascular calcifications are noted. No enlarged abdominal or pelvic lymph nodes. Reproductive: Uterus and bilateral adnexa are unremarkable. Other: No ascites, focal collection or pneumoperitoneum. Scattered oval densities within the anterior low pelvic subcutaneous fat most likely represent injection sites. Musculoskeletal: No acute or suspicious bony abnormalities are noted. Unchanged chronic L1 vertebral body fracture with sclerosis and lytic resorption again noted. IMPRESSION: 1.  Streaky opacities within both posterior RIGHT UPPER lobe and SUPERIOR segment RIGHT LOWER lobe, favor atelectasis over infection/pneumonia. Mild to moderate bilateral LOWER lobe atelectasis. 2. No evidence of acute abnormality within the abdomen or pelvis. 3. Cardiomegaly and coronary artery disease. 4. Cholelithiasis without CT evidence of acute cholecystitis. 5. Unchanged splenomegaly. 6. Aortic Atherosclerosis (ICD10-I70.0). Electronically Signed   By: Margarette Canada M.D.   On: 10/29/2021 13:25   ? ?Medications: ? sodium chloride Stopped (10/20/2021 2020)  ? sodium chloride    ? sodium chloride    ? sodium chloride    ? sodium chloride    ? feeding supplement (NEPRO CARB STEADY) 45 mL/hr at 10/29/21 1145  ? ? aspirin  81 mg Per Tube Daily  ? atorvastatin  80 mg Per Tube q1800  ? calcitRIOL  0.25 mcg Per Tube Q T,Th,Sat-1800  ? carvedilol  6.25 mg Per Tube BID WC  ? chlorhexidine gluconate (MEDLINE KIT)  15 mL Mouth Rinse BID  ? Chlorhexidine Gluconate Cloth  6 each Topical Q0600  ? cholestyramine  4 g Per Tube TID  ? darbepoetin (ARANESP) injection -  DIALYSIS  150 mcg Intravenous Q Thu-HD  ? ezetimibe  10 mg Per Tube Daily  ? feeding supplement (PROSource TF)  45 mL Per Tube TID  ? fiber  1 packet Per Tube TID  ? folic acid  1 mg Per Tube Daily  ? gabapentin  300 mg Per Tube Q8H  ? heparin injection (subcutaneous)  5,000 Units Subcutaneous Q8H  ? hydrALAZINE  50 mg Per Tube BID  ? insulin aspart  0-15 Units Subcutaneous Q4H  ? insulin aspart  5 Units Subcutaneous Q4H  ? insulin detemir  15 Units Subcutaneous BID  ? lacosamide  50 mg Per Tube BID  ? leptospermum manuka honey  1 application. Topical UD  ? levothyroxine  50 mcg Per Tube QAC breakfast  ? mouth rinse  15 mL Mouth Rinse 10 times per day  ? multivitamin  1 tablet Per Tube QHS  ? pantoprazole sodium  40 mg Per Tube Daily  ? QUEtiapine  50 mg Per Tube BID  ? thiamine  100 mg Per Tube Daily  ? vitamin B-12  1,000 mcg Per Tube Daily  ? ? ?Dialysis Orders: ?TTS  [facility] ?  3h 5mn  75.5kg  2/2.5 bath  400 bfr   Hep none LUE AVF ? - 4/14 > hep B Ag negative and hep B Ab's low/ not protective ? - mircera 200 q2 wks ? - venofer 50 weekly ? - rocaltrol 0.25 tiw ?

## 2021-11-01 ENCOUNTER — Ambulatory Visit: Payer: Medicaid Other | Admitting: Physician Assistant

## 2021-11-01 DIAGNOSIS — G9340 Encephalopathy, unspecified: Secondary | ICD-10-CM | POA: Diagnosis not present

## 2021-11-01 DIAGNOSIS — R403 Persistent vegetative state: Secondary | ICD-10-CM | POA: Diagnosis not present

## 2021-11-01 DIAGNOSIS — Z7189 Other specified counseling: Secondary | ICD-10-CM | POA: Diagnosis not present

## 2021-11-01 DIAGNOSIS — J9601 Acute respiratory failure with hypoxia: Secondary | ICD-10-CM | POA: Diagnosis not present

## 2021-11-01 DIAGNOSIS — I6783 Posterior reversible encephalopathy syndrome: Secondary | ICD-10-CM | POA: Diagnosis not present

## 2021-11-01 DIAGNOSIS — N186 End stage renal disease: Secondary | ICD-10-CM | POA: Diagnosis not present

## 2021-11-01 LAB — RENAL FUNCTION PANEL
Albumin: 2.5 g/dL — ABNORMAL LOW (ref 3.5–5.0)
Anion gap: 12 (ref 5–15)
BUN: 57 mg/dL — ABNORMAL HIGH (ref 6–20)
CO2: 24 mmol/L (ref 22–32)
Calcium: 8.6 mg/dL — ABNORMAL LOW (ref 8.9–10.3)
Chloride: 99 mmol/L (ref 98–111)
Creatinine, Ser: 4.98 mg/dL — ABNORMAL HIGH (ref 0.44–1.00)
GFR, Estimated: 10 mL/min — ABNORMAL LOW (ref >60)
Glucose, Bld: 154 mg/dL — ABNORMAL HIGH (ref 70–99)
Phosphorus: 4.4 mg/dL (ref 2.5–4.6)
Potassium: 4 mmol/L (ref 3.5–5.1)
Sodium: 135 mmol/L (ref 135–145)

## 2021-11-01 LAB — CULTURE, BLOOD (ROUTINE X 2)
Culture: NO GROWTH
Culture: NO GROWTH
Special Requests: ADEQUATE
Special Requests: ADEQUATE

## 2021-11-01 LAB — THC,MS,WB/SP RFX
Cannabidiol: NEGATIVE ng/mL
Cannabinoid Confirmation: NEGATIVE
Carboxy-THC: NEGATIVE ng/mL
Hydroxy-THC: NEGATIVE ng/mL
Tetrahydrocannabinol(THC): NEGATIVE ng/mL

## 2021-11-01 LAB — GLUCOSE, CAPILLARY
Glucose-Capillary: 118 mg/dL — ABNORMAL HIGH (ref 70–99)
Glucose-Capillary: 128 mg/dL — ABNORMAL HIGH (ref 70–99)
Glucose-Capillary: 131 mg/dL — ABNORMAL HIGH (ref 70–99)
Glucose-Capillary: 132 mg/dL — ABNORMAL HIGH (ref 70–99)
Glucose-Capillary: 140 mg/dL — ABNORMAL HIGH (ref 70–99)
Glucose-Capillary: 153 mg/dL — ABNORMAL HIGH (ref 70–99)

## 2021-11-01 MED ORDER — ORAL CARE MOUTH RINSE
15.0000 mL | Freq: Two times a day (BID) | OROMUCOSAL | Status: DC
  Administered 2021-11-01: 15 mL via OROMUCOSAL

## 2021-11-01 MED ORDER — HYDRALAZINE HCL 25 MG PO TABS: 25.0000 mg | ORAL_TABLET | Freq: Two times a day (BID) | ORAL | Status: DC

## 2021-11-01 MED ORDER — CHLORHEXIDINE GLUCONATE 0.12 % MT SOLN
15.0000 mL | Freq: Two times a day (BID) | OROMUCOSAL | Status: DC
  Administered 2021-11-01: 15 mL via OROMUCOSAL
  Filled 2021-11-01: qty 15

## 2021-11-01 NOTE — Progress Notes (Signed)
Subjective: No acute events overnight.  Tmax 100.8.  Of note, patient was agitated during dialysis yesterday. ? ?ROS: Unable to obtain due to poor mental status ? ?Examination ? ?Vital signs in last 24 hours: ?Temp:  [98.9 ?F (37.2 ?C)-100.8 ?F (38.2 ?C)] 98.9 ?F (37.2 ?C) (04/26 0300) ?Pulse Rate:  [95-104] 98 (04/26 1221) ?Resp:  [16-26] 20 (04/26 1221) ?BP: (86-131)/(38-86) 109/56 (04/26 1200) ?SpO2:  [94 %-100 %] 100 % (04/26 1221) ?FiO2 (%):  [28 %] 28 % (04/26 1221) ? ?General: lying in bed, NAD ?Neuro: Did not open eyes spontaneously and again tried to resist when I tried opening her eyes, did not follow commands, no apparent facial asymmetry, flexed posture in bilateral upper extremities but does withdraw to noxious stimulation with antigravity strength, withdraws to noxious stimuli in bilateral lower extremities ? ?Basic Metabolic Panel: ?Recent Labs  ?Lab 10/27/21 ?0539 10/27/21 ?7673 10/28/21 ?4193 10/29/21 ?7902 10/30/21 ?4097 10/30/21 ?3532 10/30/21 ?2109 10/31/21 ?9924 11/01/21 ?0732  ?NA 133*   < > 131* 134* 133* 136 134* 134* 135  ?K 3.2*   < > 3.7 3.0* 3.6 3.8 3.8 3.7 4.0  ?CL 97*   < > 93* 98 99 99 99 99 99  ?CO2 26   < > '24 25 23 24 '$ 21* 20* 24  ?GLUCOSE 182*   < > 164* 133* 152* 127* 177* 146* 154*  ?BUN 34*   < > 63* 41* 65* 71* 86* 91* 57*  ?CREATININE 3.17*   < > 4.94* 3.67* 5.31* 5.85* 6.56* 7.00* 4.98*  ?CALCIUM 8.4*   < > 8.7* 8.4* 8.6* 8.8* 8.4* 8.6* 8.6*  ?MG 2.0  --  2.3 2.0 2.3  --   --  2.5*  --   ?PHOS 3.4   < > 5.0* 2.7 4.2 4.5 5.3* 5.1* 4.4  ? < > = values in this interval not displayed.  ? ? ?CBC: ?Recent Labs  ?Lab 10/27/21 ?2683 10/28/21 ?4196 10/29/21 ?2229 10/29/21 ?1322 10/30/21 ?0123 10/30/21 ?2109 10/31/21 ?0123  ?WBC 10.6* 10.7* 11.4*  --  10.1 9.2 10.1  ?NEUTROABS 7.1 6.7 7.0  --  6.2  --  6.7  ?HGB 8.0* 8.2* 6.7* 8.5* 8.2* 7.9* 8.1*  ?HCT 24.2* 24.5* 20.6* 25.8* 25.3* 24.2* 24.8*  ?MCV 98.4 98.0 100.5*  --  97.3 97.6 96.9  ?PLT 226 210 205  --  211 209 207   ? ? ? ?Coagulation Studies: ?No results for input(s): LABPROT, INR in the last 72 hours. ? ?Imaging ?No new brain imaging overnight ?  ?ASSESSMENT AND PLAN: 51 year old female with seizures and altered mental status in the setting of systolic blood pressure 798 which has since improved. ?  ?Seizure ?Posterior reversible encephalopathy syndrome (PRES), resolved ?Persistent encephalopathy ?Intermittent fevers ?-No clear cause of encephalopathy.  No evidence of infection, no toxic -metabolic causes, seizures have resolved, has been off sedation for more than 23 weeks, MRI brain has been improving.  It is possible patient had some hypoxic or hypoglycemic injury ?-Intermittent fevers could be secondary to hypoxic injury.  No evidence of infection.  ?  ?Recommendations ?-Continue Vimpat 50 mg twice daily.  This can likely be tapered off after June ?-Also on gabapentin and Seroquel which is mostly for agitation. ?-Had a conference call with palliative care team as well as patient's daughters and significant other.  I discussed that patient has been in the hospital for almost a month and has been off sedation for more than 2 weeks but continues to be  minimally responsive state. So far, I have not seen any meaningful interaction from patient.  We also discussed that patient continues to have intermittent low-grade fevers as well as difficulties during dialysis. I discussed my concern that at this point, there is very low likelihood that patient will return to independent function. We also discussed what trach and peg looks like as well as the challenges that come along with prolonged immobility including risk of aspiration, sacral ulcers, DVTs, muscle atrophy.   Patient's family is interested in giving patient more time.  We will reevaluate in few more weeks ?-Continue seizure precautions ?-As needed IV Ativan 2 mg for clinical seizure-like activity ?-Management of rest of comorbidities per primary team ?  ?I have spent a  total of 45 minutes with the patient reviewing hospital notes,  test results, labs and examining the patient as well as establishing an assessment and plan. > 50% of time was spent in direct patient care. ?  ?Zeb Comfort ?Epilepsy ?[facility] ?For questions after 5pm please refer to AMION to reach the Neurologist on call ? ?

## 2021-11-01 NOTE — Progress Notes (Signed)
? ?                                                                                                                                                     ?                                                   ?Daily Progress Note  ? ?Patient Name: Melanie Hall       Date: 11/01/2021 ?DOB: 10/06/1970  Age: 51 y.o. MRN#: [id number] ?Attending Physician: Jonetta Osgood, MD ?Primary Care Physician: Renee Rival, FNP ?Admit Date: 10/03/2021 ? ?Reason for Consultation/Follow-up: Establishing goals of care ? ?Patient Profile/HPI:  The patient is a 51 year old former smoker with past medical history significant for but not limited to history of ESRD on hemodialysis, CHF, anemia, osteoarthritis, history of CAD, history of diabetes mellitus type 2, hypertension, hyperlipidemia, history of CVA as well as other comorbidities who was found at her home by her patient transport driver to be unresponsive.  Patient's neighbor noted that she was tapping her hands for about a minute and then stop.  She was snoring and drooling and had no improvement after Narcan was given.  She is noted to have a normal CBG and blood pressure was 215/96 on admission.  She was intubated in [facility] for airway protection, given a Keppra load and started on dipper Van and started on antibiotics.  Head CT was negative for any acute findings and she was treated for hyperkalemia.  She did similar episode on 09/24/2021 when EMS was called to the house but the episode resolved and patient declined hospital evaluation.  She had no prior history of seizures or known head injury and the ED team discussed with neurology and they advised the patient to be transferred to [facility] for EEG monitoring and further neurology assessment. MRI on 3/30 indicated PRES. She has remained minimally responsive- s/p trach. Cortrack is in. There may be a component of ischemic brain injury. ?  ?Palliative care has been asked to get involved to have further goals of  care conversations.  ? ?Subjective: Chart reviewed including labs, progress notes.  ?Evaluated [name]. She was lying in bed. Did not open her eyes to voice or light touch. Hands are clenched, elbows contracted.  ?Conference call held with Fletcher Anon, Verdis Frederickson and Glenard Haring. Dr. Hortense Ramal also present. Patient's ex-husband joined in at the end of the call.  ?Medical updates given.  ?Reviewed possible trajectories.  ?Reviewed barriers that patient is facing to meaningful recovery. Also discussed issues with continuing dialysis.  ?Family was unable to state overall goals of care of define meaning of living or quality of life  that patient would prefer.  ?At end of call- plan made for patient's daughters to discuss conversation separately and Palliative provider will reach out tomorrow or next day for followup.  ?We discussed that the priority decision at this point is regarding PEG tube- need to either place PEG tube if continued aggressive medical intervention is desired, or consider transition to full comfort and hospice focused care.  ?Encouraged family that their role is to speak for the patient and what choices she would make for herself.  ?  ?Review of Systems  ?Unable to perform ROS: Mental acuity  ? ? ?Physical Exam ?Vitals and nursing note reviewed.  ?Constitutional:   ?   Appearance: She is ill-appearing.  ?HENT:  ?   Head:  ?   Comments: Cortrak in place ?Neurological:  ?   Comments: No purposeful movements or interactions, does not open eyes  ?         ? ?Vital Signs: BP (!) 109/56   Pulse 98   Temp 98.9 ?F (37.2 ?C) (Oral)   Resp 20   Ht '5\' 3"'$  (1.6 m)   Wt 78.5 kg   LMP 12/17/2016   SpO2 100%   BMI 30.66 kg/m?  ?SpO2: SpO2: 100 % ?O2 Device: O2 Device: Tracheostomy Collar ?O2 Flow Rate: O2 Flow Rate (L/min): 5 L/min ? ?Intake/output summary:  ?Intake/Output Summary (Last 24 hours) at 11/01/2021 1400 ?Last data filed at 11/01/2021 1100 ?Gross per 24 hour  ?Intake 775 ml  ?Output --  ?Net 775 ml  ? ?LBM: Last  BM Date : 10/31/21 ?Baseline Weight: Weight: 83.9 kg ?Most recent weight: Weight: 78.5 kg ? ?     ?Palliative Assessment/Data: PPS: 10% ? ? ? ? ? ?Patient Active Problem List  ? Diagnosis Date Noted  ? Acute encephalopathy   ? Goals of care, counseling/discussion   ? PRES (posterior reversible encephalopathy syndrome)   ? ESRD (end stage renal disease) ([place])   ? Acute respiratory failure ([place])   ? Secondary hypertension   ? Type 2 diabetes mellitus with hyperglycemia ([facility])   ? Pressure injury of skin 10/05/2021  ? Altered mental status 09/25/2021  ? Swelling of left lower extremity 09/20/2021  ? CAD (coronary artery disease) 02/17/2021  ? Ischemic cardiomyopathy 02/17/2021  ? S/P ankle fusion 06/01/2020  ? Pilon fracture of left tibia, closed, initial encounter   ? Displaced trimalleolar fracture of left lower leg, initial encounter for closed fracture   ? Abnormality of gait and mobility 05/26/2020  ? Migraine 05/26/2020  ? Diabetic retinopathy ([place]) 05/04/2020  ? Chronic diarrhea of unknown origin 01/14/2020  ? Type 2 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, with long-term current use of insulin ([place]) 12/29/2019  ? Hypothyroidism 12/29/2019  ? Weakness of both lower extremities 11/12/2019  ? Debility 09/10/2019  ? ESRD (end stage renal disease) on dialysis ([place]) 09/10/2019  ? Depression 09/10/2019  ? Incontinence in female 09/10/2019  ? Anemia in chronic kidney disease 08/19/2019  ? MGUS (monoclonal gammopathy of unknown significance) 07/23/2019  ? GERD (gastroesophageal reflux disease) 02/28/2019  ? Essential hypertension 10/28/2018  ? Irregular menses 09/14/2009  ? Hypercholesterolemia 11/19/2008  ? Type 2 diabetes with nephropathy ([place]) 07/02/2007  ? ? ?Palliative Care Assessment & Plan  ? ? ?Assessment/Recommendations/Plan ? ?Continue current plan of care ?[name] would like to observe Alexandre during HD- have asked nurse to call with HD time so [name] can be present tomorrow ?PMT will  continue to reach out to  family and discuss goals of care ? ? ?Code Status: ?DNR ? ?Prognosis: ? Unable to determine ? ?Discharge Planning: ?To Be Determined ? ?Care plan was discussed with patient's family and care team.  ? ?Thank you for allowing the Palliative Medicine Team to assist in the care of this patient. ? ?Total time: 90 minutes ? ?Mariana Kaufman, AGNP-C ?Palliative Medicine ? ? ?Please contact Palliative Medicine Team phone at [phone] for questions and concerns.  ? ? ? ? ? ? ?

## 2021-11-01 NOTE — Progress Notes (Addendum)
?[facility] ?Progress Note  ? ?Subjective:  Seen in room. Very restless in her bed, not responsive or following commands. ? ?Objective ?Vitals:  ? 11/01/21 0339 11/01/21 0400 11/01/21 0749 11/01/21 0808  ?BP:  (!) 96/53  (!) 89/38  ?Pulse: 95  98 97  ?Resp: (!) '21 19 16 19  '$ ?Temp:      ?TempSrc:      ?SpO2: 97%  97%   ?Weight:      ?Height:      ? ?Physical Exam ?General: Chronically ill woman, very restless, non-responsive to questions. NG tube in place. ?Heart: RRR; no murmur ?Lungs: CTA anteriorly, trach with O2 collar ?Abdomen: soft ?Extremities: No LE edema ?Dialysis Access: LUE AVF + bruit ? ?Additional Objective ?Labs: ?Basic Metabolic Panel: ?Recent Labs  ?Lab 10/30/21 ?2109 10/31/21 ?0123 11/01/21 ?0732  ?NA 134* 134* 135  ?K 3.8 3.7 4.0  ?CL 99 99 99  ?CO2 21* 20* 24  ?GLUCOSE 177* 146* 154*  ?BUN 86* 91* 57*  ?CREATININE 6.56* 7.00* 4.98*  ?CALCIUM 8.4* 8.6* 8.6*  ?PHOS 5.3* 5.1* 4.4  ? ?Liver Function Tests: ?Recent Labs  ?Lab 10/29/21 ?2993 10/30/21 ?0123 10/30/21 ?7169 10/30/21 ?2109 10/31/21 ?0123 11/01/21 ?0732  ?AST 35 36  --   --  32  --   ?ALT 25 31  --   --  31  --   ?ALKPHOS 133* 165*  --   --  165*  --   ?BILITOT 0.4 0.3  --   --  0.6  --   ?PROT 5.9* 6.6  --   --  6.4*  --   ?ALBUMIN 2.4* 2.5*   < > 2.4* 2.5* 2.5*  ? < > = values in this interval not displayed.  ? ?CBC: ?Recent Labs  ?Lab 10/28/21 ?0251 10/29/21 ?0152 10/29/21 ?1322 10/30/21 ?0123 10/30/21 ?2109 10/31/21 ?0123  ?WBC 10.7* 11.4*  --  10.1 9.2 10.1  ?NEUTROABS 6.7 7.0  --  6.2  --  6.7  ?HGB 8.2* 6.7*   < > 8.2* 7.9* 8.1*  ?HCT 24.5* 20.6*   < > 25.3* 24.2* 24.8*  ?MCV 98.0 100.5*  --  97.3 97.6 96.9  ?PLT 210 205  --  211 209 207  ? < > = values in this interval not displayed.  ? ?Blood Culture ?   ?Component Value Date/Time  ? SDES BLOOD RIGHT HAND 10/27/2021 1038  ? SPECREQUEST  10/27/2021 1038  ?  BOTTLES DRAWN AEROBIC AND ANAEROBIC Blood Culture adequate volume  ? CULT  10/27/2021 1038  ?  NO GROWTH 5  DAYS ?Performed at [facility], [place] [address] [place], [place] [address] ?  ? REPTSTATUS 11/01/2021 FINAL 10/27/2021 1038  ? ?Studies/Results: ?DG CHEST PORT 1 VIEW ? ?Result Date: 10/31/2021 ?CLINICAL DATA:  Shortness of breath EXAM: PORTABLE CHEST 1 VIEW COMPARISON:  Chest radiograph 1 day prior, CT chest 10/29/2021 FINDINGS: The tracheostomy tube is stable. The enteric catheter tip is off the field of view. Heart is enlarged with prominent upper mediastinal contours, unchanged. Lung volumes are low. There is no new or worsening focal airspace disease. There is no significant pleural effusion. There is no appreciable pneumothorax The bones are stable. IMPRESSION: Low lung volumes. Otherwise, no radiographic evidence of acute cardiopulmonary process. No significant interval change since the study from 1 day prior. Electronically Signed   By: Valetta Mole M.D.   On: 10/31/2021 08:21  ? ?DG Abd Portable 1V ? ?Result Date: 10/31/2021 ?CLINICAL  DATA:  NG tube placement EXAM: PORTABLE ABDOMEN - 1 VIEW COMPARISON:  None. FINDINGS: Gastric tube with tip in the gastric antrum. IMPRESSION: Gastric tube with tip in the gastric antrum Electronically Signed   By: Suzy Bouchard M.D.   On: 10/31/2021 15:00  ? ?VAS [place] LOWER EXTREMITY VENOUS (DVT) ? ?Result Date: 10/31/2021 ? Lower Venous DVT Study Patient Name:  BLESS LISENBY  Date of Exam:   10/31/2021 Medical Rec #: [id number]      Accession #:    [id number] Date of Birth: 1970/08/09      Patient Gender: F Patient Age:   51 years Exam Location:  [facility] Procedure:      VAS [place] LOWER EXTREMITY VENOUS (DVT) Referring Phys: Raiford Noble --------------------------------------------------------------------------------  Indications: Fever unknown origin.  Limitations: Patient unable to cooperate with repositioning, patient guarding, persistent coughing. Comparison Study: No prior studies. Performing Technologist: Darlin Coco RDMS, RVT  Examination Guidelines: A  complete evaluation includes B-mode imaging, spectral Doppler, color Doppler, and power Doppler as needed of all accessible portions of each vessel. Bilateral testing is considered an integral part of a complete examination. Limited examinations for reoccurring indications may be performed as noted. The reflux portion of the exam is performed with the patient in reverse Trendelenburg.  +---------+---------------+---------+-----------+----------+--------------+ RIGHT    CompressibilityPhasicitySpontaneityPropertiesThrombus Aging +---------+---------------+---------+-----------+----------+--------------+ CFV      Full           Yes      Yes                                 +---------+---------------+---------+-----------+----------+--------------+ SFJ      Full                                                        +---------+---------------+---------+-----------+----------+--------------+ FV Prox  Full                                                        +---------+---------------+---------+-----------+----------+--------------+ FV Mid   Full                                                        +---------+---------------+---------+-----------+----------+--------------+ FV DistalFull                                                        +---------+---------------+---------+-----------+----------+--------------+ PFV      Full                                                        +---------+---------------+---------+-----------+----------+--------------+ POP      Full  Yes      Yes                                 +---------+---------------+---------+-----------+----------+--------------+ PTV      Full                                                        +---------+---------------+---------+-----------+----------+--------------+ PERO     Full                                                         +---------+---------------+---------+-----------+----------+--------------+   +---------+---------------+---------+-----------+----------+--------------+ LEFT     CompressibilityPhasicitySpontaneityPropertiesThrombus Aging +---------+---------------+---------+-----------+----------+--------------+ CFV      Full           Yes      Yes                                 +---------+---------------+---------+-----------+----------+--------------+ SFJ      Full                                                        +---------+---------------+---------+-----------+----------+--------------+ FV Prox  Full                                                        +---------+---------------+---------+-----------+----------+--------------+ FV Mid   Full                                                        +---------+---------------+---------+-----------+----------+--------------+ FV DistalFull                                                        +---------+---------------+---------+-----------+----------+--------------+ PFV      Full                                                        +---------+---------------+---------+-----------+----------+--------------+ POP                     Yes      Yes                                 +---------+---------------+---------+-----------+----------+--------------+  PTV      Full                                                        +---------+---------------+---------+-----------+----------+--------------+ PERO     Full                                                        +---------+---------------+---------+-----------+----------+--------------+     Summary: RIGHT: - There is no evidence of deep vein thrombosis in the lower extremity.  - No cystic structure found in the popliteal fossa.  LEFT: - There is no evidence of deep vein thrombosis in the lower extremity. However, portions of this examination were limited-  see technologist comments above.  - No cystic structure found in the popliteal fossa.  *See table(s) above for measurements and observations.    Preliminary   ? ?VAS [place] UPPER EXTREMITY VENOUS DUPLEX ? ?Result Date: 10/31/2021 ?UPPER VENOUS STUDY  Patient Name:  EVIA GOLDSMITH  Date of Exam:   10/31/2021 Medical Rec #: [id number]      Accession #:    [id number] Date of Birth: 03/07/1971      P

## 2021-11-01 NOTE — Progress Notes (Addendum)
PROGRESS NOTE        PATIENT DETAILS Name: Melanie Hall Age: 51 y.o. Sex: female Date of Birth: 01/29/1971 Admit Date: 10/04/2021 Admitting Physician Coralyn Helling, MD YNW:GNFAOZ, Baird Kay, FNP  Brief Summary: 51 year old with history of ESRD on HD, DM-2, HTN, HLD, CVA-who was found by home health unresponsive-with questionable seizure-like activity-she was evaluated at [facility] ED-where she was intubated and transferred to [facility] for further evaluation.  Initial MRI on 3/29 was consistent with PRES syndrome-EEG was negative for seizures-unfortunately-even with supportive care-initiation of antiepileptic agents-she remains persistently encephalopathic.  Due to inability to be liberated from the ventilator-she required a tracheotomy on 4/15.  See below for further details.   Significant events: 3/29>> unresponsive-intubated at [facility] ED-transfer to [facility]. 4/03>> off sedation-still unresponsive. 4/11>> failed extubation-reintubated-bedside tracheotomy attempted but unsuccessful. 4/15>> tracheotomy performed by ENT 4/20>> transfer to [facility].  Significant studies: 4/25>> Doppler bilateral lower extremity: No DVT 4/25>> Doppler bilateral upper extremity: No DVT 4/23>> CT chest/abdomen: No acute abnormalities, unchanged splenomegaly-right upper/moderate bilateral lower lobe atelectasis. 4/12>> CT soft tissue neck: No masses/swelling evident. 4/12>> Echo: EF 40-45%, grade 1 diastolic dysfunction. 4/08>> MRI brain: No acute intracranial abnormality 3/30>> MRI brain: Findings consistent with posterior reversible encephalopathy syndrome. 3/30>> CTA head/neck: No large LVO, 40% stenosis left ICA.  Severe ostial stenosis at origin of right vertebral artery. 3/30>> CT venogram: No evidence of dural venous sinus thrombosis  Significant microbiology data: 3/29>> COVID/influenza PCR: Negative 3/29>> blood culture: Negative 4/04>> tracheal aspirate culture: Negative 4/10>> tracheal  aspirate culture: Negative 4/21>> blood culture: Negative  Procedures: 3/29>> EEG: Negative for seizures. 4/07>> EEG: Negative for seizures 4/15>> tracheotomy  Consults: PCCM, neurology, nephrology, palliative care  Subjective: Does not follow commands-appears restless-no purposeful movement.  Objective: Vitals: Blood pressure 112/66, pulse 100, temperature 98.9 F (37.2 C), temperature source Oral, resp. rate (!) 24, height 5\' 3"  (1.6 m), weight 78.5 kg, last menstrual period 12/17/2016, SpO2 100 %.   Exam: Gen Exam: Confused-not following commands. HEENT:atraumatic, normocephalic Chest: B/L clear to auscultation anteriorly CVS:S1S2 regular Abdomen:soft non tender, non distended Extremities:no edema Neurology: Difficult exam-but seems to be moving all 4 extremities Skin: no rash  Pertinent Labs/Radiology:    Latest Ref Rng & Units 10/31/2021    1:23 AM 10/30/2021    9:09 PM 10/30/2021    1:23 AM  CBC  WBC 4.0 - 10.5 K/uL 10.1   9.2   10.1    Hemoglobin 12.0 - 15.0 g/dL 8.1   7.9   8.2    Hematocrit 36.0 - 46.0 % 24.8   24.2   25.3    Platelets 150 - 400 K/uL 207   209   211      Lab Results  Component Value Date   NA 135 11/01/2021   K 4.0 11/01/2021   CL 99 11/01/2021   CO2 24 11/01/2021      Assessment/Plan: Persistent encephalopathy: Unclear etiology-remains persistently encephalopathic in spite of being on AEDs and radiographic resolution of PRES syndrome on repeat MRI brain on 4/8.  Per neurology note-patient has been off sedation for 3 weeks.  Concern for anoxic injury in the setting of possible status epilepticus with PRES syndrome.  Her overall prognosis is felt to be poor-felt unlikely to regain her prior level of functioning.  Goals of care discussion ongoing with family by palliative care team-family  meeting scheduled for later this afternoon.  Possible status epilepticus : Likely provoked by PRES.  Neurology followed closely during this hospitalization.   Remains on Vimpat-neurology recommending that this can be tapered off after June.  PRES: Resolved-on repeat MRI on 4/8.  Continue to optimize BP control.  Acute hypoxic respiratory failure due to aspiration pneumonia-s/p failure to wean off ventilator-now with tracheotomy: Continue trach care-has finished a course of antimicrobial therapy.  Fever: Afebrile currently-extensive work-up including repeat blood cultures/CT chest/abdomen were all negative.  No DVT seen in upper/lower extremity Dopplers as well.  Concern for central fever in the setting of anoxic injury.  ESRD on HD: followed closely by nephrology.  Normocytic anemia: Multifactorial in the setting of ESRD-acute illness.  Continue to follow closely and transfuse if Hb < 7.  Defer Aranesp/IV iron to nephrology service.  Chronic HFrEF: Volume status stable-volume removal with HD.  History of CAD s/p PCI 2021: No obvious anginal symptoms-on ASA/statin/Coreg  DM-2 (A1c 10.5 on 3/15): CBG stable-continue Levemir 15 units twice daily, of 5 units of NovoLog with meals and SSI.  Follow and adjust.  Recent Labs    11/01/21 0432 11/01/21 0818 11/01/21 1152  GLUCAP 132* 153* 140*    Hypothyroidism: Continue Synthroid-TSH stable.  Dysphagia: Due to severe encephalopathy-cortak in place-goals of care discussions ongoing to see whether family would like to pursue PEG tube placement.  Palliative care: Unfortunate 51 year old with ESRD/ischemic cardiomyopathy/ CAD s/p PCI 2021 who initially presented unresponsive-thought to have possible status epilepticus in the setting of posterior reversible encephalopathy syndrome.  In spite of maximal supportive care-initiation of antiepileptics-she continues to be encephalopathic.  Per neurology-poor prognosis-likely will not get back to her prior level of functioning.  Goals of care discussion ongoing with family by the palliative care team.  Will await further recommendations.  Nutrition  Status: Nutrition Problem: Inadequate oral intake Etiology: inability to eat Signs/Symptoms: NPO status Interventions: MVI, Tube feeding, Prostat  Pressure Ulcer: Pressure Injury 10/04/21 Toe (Comment  which one) Anterior;Right Deep Tissue Pressure Injury - Purple or maroon localized area of discolored intact skin or blood-filled blister due to damage of underlying soft tissue from pressure and/or shear. (Active)  10/04/21 1551  Location: Toe (Comment  which one)  Location Orientation: Anterior;Right  Staging: Deep Tissue Pressure Injury - Purple or maroon localized area of discolored intact skin or blood-filled blister due to damage of underlying soft tissue from pressure and/or shear.  Wound Description (Comments):   Present on Admission: Yes  Dressing Type Foam - Lift dressing to assess site every shift 11/01/21 0808     Pressure Injury 10/04/21 Sacrum Medial Stage 1 -  Intact skin with non-blanchable redness of a localized area usually over a bony prominence. (Active)  10/04/21 1553  Location: Sacrum  Location Orientation: Medial  Staging: Stage 1 -  Intact skin with non-blanchable redness of a localized area usually over a bony prominence.  Wound Description (Comments):   Present on Admission: Yes  Dressing Type Foam - Lift dressing to assess site every shift 11/01/21 0808     Pressure Injury Posterior;Right Deep Tissue Pressure Injury - Purple or maroon localized area of discolored intact skin or blood-filled blister due to damage of underlying soft tissue from pressure and/or shear. purple (Active)     Location:   Location Orientation: Posterior;Right  Staging: Deep Tissue Pressure Injury - Purple or maroon localized area of discolored intact skin or blood-filled blister due to damage of underlying soft tissue from pressure and/or  shear.  Wound Description (Comments): purple  Present on Admission:   Dressing Type Foam - Lift dressing to assess site every shift 11/01/21 0808      Pressure Injury 10/16/21 Anus Upper Stage 3 -  Full thickness tissue loss. Subcutaneous fat may be visible but bone, tendon or muscle are NOT exposed. brown slough (Active)  10/16/21 1802  Location: Anus  Location Orientation: Upper  Staging: Stage 3 -  Full thickness tissue loss. Subcutaneous fat may be visible but bone, tendon or muscle are NOT exposed.  Wound Description (Comments): brown slough  Present on Admission: No  Dressing Type Foam - Lift dressing to assess site every shift 11/01/21 0808    Obesity: Estimated body mass index is 30.66 kg/m as calculated from the following:   Height as of this encounter: 5\' 3"  (1.6 m).   Weight as of this encounter: 78.5 kg.   Code status:   Code Status: DNR   DVT Prophylaxis: heparin injection 5,000 Units Start: 10/23/21 0600 SCDs Start: 10/19/21 0801   Family Communication: None at bedside-goals of care meeting scheduled for later today.   Disposition Plan: Status is: Inpatient Remains inpatient appropriate because: Persistent encephalopathy-see above-awaiting goals of care discussion.   Planned Discharge Destination: To be determined   Diet: Diet Order             Diet NPO time specified  Diet effective now                     Antimicrobial agents: Anti-infectives (From admission, onward)    Start     Dose/Rate Route Frequency Ordered Stop   10/18/21 2200  piperacillin-tazobactam (ZOSYN) IVPB 2.25 g        2.25 g 100 mL/hr over 30 Minutes Intravenous Every 8 hours 10/18/21 1320 10/21/21 0605   10/16/21 0900  piperacillin-tazobactam (ZOSYN) IVPB 2.25 g  Status:  Discontinued        2.25 g 100 mL/hr over 30 Minutes Intravenous Every 8 hours 10/16/21 0758 10/18/21 1320   10/06/21 1200  ceFEPIme (MAXIPIME) 2 g in sodium chloride 0.9 % 100 mL IVPB  Status:  Discontinued        2 g 200 mL/hr over 30 Minutes Intravenous Every M-W-F (Hemodialysis) 10/04/21 1051 10/05/21 0811   10/06/21 1200  vancomycin (VANCOCIN)  IVPB 1000 mg/200 mL premix  Status:  Discontinued        1,000 mg 200 mL/hr over 60 Minutes Intravenous Every M-W-F (Hemodialysis) 10/04/21 1051 10/05/21 0240   10/05/21 1200  vancomycin (VANCOCIN) IVPB 1000 mg/200 mL premix  Status:  Discontinued        1,000 mg 200 mL/hr over 60 Minutes Intravenous Every T-Th-Sa (Hemodialysis) 10/05/21 0240 10/05/21 0816   10/05/21 1200  ceFEPIme (MAXIPIME) 2 g in sodium chloride 0.9 % 100 mL IVPB  Status:  Discontinued        2 g 200 mL/hr over 30 Minutes Intravenous Every T-Th-Sa (Hemodialysis) 10/05/21 0811 10/05/21 0816   10/05/21 0915  cefTRIAXone (ROCEPHIN) 2 g in sodium chloride 0.9 % 100 mL IVPB        2 g 200 mL/hr over 30 Minutes Intravenous Every 24 hours 10/05/21 0817 10/08/21 1159   10/04/21 1100  vancomycin (VANCOREADY) IVPB 1500 mg/300 mL        1,500 mg 150 mL/hr over 120 Minutes Intravenous  Once 10/04/21 1045 10/04/21 1500   10/04/21 1045  ceFEPIme (MAXIPIME) 2 g in sodium chloride 0.9 % 100 mL IVPB  2 g 200 mL/hr over 30 Minutes Intravenous  Once 10/04/21 1035 10/04/21 1136   10/04/21 1045  metroNIDAZOLE (FLAGYL) IVPB 500 mg        500 mg 100 mL/hr over 60 Minutes Intravenous  Once 10/04/21 1035 10/04/21 1250   10/04/21 1045  vancomycin (VANCOCIN) IVPB 1000 mg/200 mL premix  Status:  Discontinued        1,000 mg 200 mL/hr over 60 Minutes Intravenous  Once 10/04/21 1035 10/04/21 1045        MEDICATIONS: Scheduled Meds:  aspirin  81 mg Per Tube Daily   atorvastatin  80 mg Per Tube q1800   calcitRIOL  0.25 mcg Per Tube Q T,Th,Sat-1800   carvedilol  6.25 mg Per Tube BID WC   Chlorhexidine Gluconate Cloth  6 each Topical Q0600   cholestyramine  4 g Per Tube TID   darbepoetin (ARANESP) injection - DIALYSIS  150 mcg Intravenous Q Thu-HD   ezetimibe  10 mg Per Tube Daily   feeding supplement (PROSource TF)  45 mL Per Tube TID   fiber  1 packet Per Tube TID   folic acid  1 mg Per Tube Daily   gabapentin  300 mg Per Tube Q8H    heparin injection (subcutaneous)  5,000 Units Subcutaneous Q8H   insulin aspart  0-15 Units Subcutaneous Q4H   insulin aspart  5 Units Subcutaneous Q4H   insulin detemir  15 Units Subcutaneous BID   lacosamide  50 mg Per Tube BID   leptospermum manuka honey  1 application. Topical UD   levothyroxine  50 mcg Per Tube QAC breakfast   mouth rinse  15 mL Mouth Rinse BID   multivitamin  1 tablet Per Tube QHS   pantoprazole sodium  40 mg Per Tube Daily   QUEtiapine  50 mg Per Tube BID   thiamine  100 mg Per Tube Daily   vitamin B-12  1,000 mcg Per Tube Daily   Continuous Infusions:  sodium chloride Stopped (10/21/21 2020)   feeding supplement (NEPRO CARB STEADY) 45 mL/hr at 10/29/21 1145   ferric gluconate (FERRLECIT) IVPB Stopped (10/31/21 1259)   PRN Meds:.acetaminophen, docusate, Gerhardt's butt cream, hydrALAZINE, lidocaine-EPINEPHrine (PF), loperamide HCl, polyethylene glycol   I have personally reviewed following labs and imaging studies  LABORATORY DATA: CBC: Recent Labs  Lab 10/27/21 0050 10/28/21 0251 10/29/21 0152 10/29/21 1322 10/30/21 0123 10/30/21 2109 10/31/21 0123  WBC 10.6* 10.7* 11.4*  --  10.1 9.2 10.1  NEUTROABS 7.1 6.7 7.0  --  6.2  --  6.7  HGB 8.0* 8.2* 6.7* 8.5* 8.2* 7.9* 8.1*  HCT 24.2* 24.5* 20.6* 25.8* 25.3* 24.2* 24.8*  MCV 98.4 98.0 100.5*  --  97.3 97.6 96.9  PLT 226 210 205  --  211 209 207    Basic Metabolic Panel: Recent Labs  Lab 10/27/21 0050 10/27/21 0727 10/28/21 0251 10/29/21 0152 10/30/21 0123 10/30/21 0741 10/30/21 2109 10/31/21 0123 11/01/21 0732  NA 133*   < > 131* 134* 133* 136 134* 134* 135  K 3.2*   < > 3.7 3.0* 3.6 3.8 3.8 3.7 4.0  CL 97*   < > 93* 98 99 99 99 99 99  CO2 26   < > 24 25 23 24  21* 20* 24  GLUCOSE 182*   < > 164* 133* 152* 127* 177* 146* 154*  BUN 34*   < > 63* 41* 65* 71* 86* 91* 57*  CREATININE 3.17*   < > 4.94* 3.67* 5.31* 5.85*  6.56* 7.00* 4.98*  CALCIUM 8.4*   < > 8.7* 8.4* 8.6* 8.8* 8.4* 8.6*  8.6*  MG 2.0  --  2.3 2.0 2.3  --   --  2.5*  --   PHOS 3.4   < > 5.0* 2.7 4.2 4.5 5.3* 5.1* 4.4   < > = values in this interval not displayed.    GFR: Estimated Creatinine Clearance: 13.2 mL/min (A) (by C-G formula based on SCr of 4.98 mg/dL (H)).  Liver Function Tests: Recent Labs  Lab 10/27/21 0050 10/27/21 0727 10/28/21 0251 10/29/21 0152 10/30/21 0123 10/30/21 0741 10/30/21 2109 10/31/21 0123 11/01/21 0732  AST 30  --  32 35 36  --   --  32  --   ALT 25  --  26 25 31   --   --  31  --   ALKPHOS 134*  --  149* 133* 165*  --   --  165*  --   BILITOT 0.6  --  0.8 0.4 0.3  --   --  0.6  --   PROT 6.2*  --  6.5 5.9* 6.6  --   --  6.4*  --   ALBUMIN 2.3*   < > 2.6* 2.4* 2.5* 2.6* 2.4* 2.5* 2.5*   < > = values in this interval not displayed.   No results for input(s): LIPASE, AMYLASE in the last 168 hours. No results for input(s): AMMONIA in the last 168 hours.  Coagulation Profile: No results for input(s): INR, PROTIME in the last 168 hours.  Cardiac Enzymes: No results for input(s): CKTOTAL, CKMB, CKMBINDEX, TROPONINI in the last 168 hours.  BNP (last 3 results) No results for input(s): PROBNP in the last 8760 hours.  Lipid Profile: No results for input(s): CHOL, HDL, LDLCALC, TRIG, CHOLHDL, LDLDIRECT in the last 72 hours.  Thyroid Function Tests: No results for input(s): TSH, T4TOTAL, FREET4, T3FREE, THYROIDAB in the last 72 hours.  Anemia Panel: No results for input(s): VITAMINB12, FOLATE, FERRITIN, TIBC, IRON, RETICCTPCT in the last 72 hours.  Urine analysis:    Component Value Date/Time   COLORURINE YELLOW 09/10/2019 0128   APPEARANCEUR TURBID (A) 09/10/2019 0128   LABSPEC >1.030 (H) 09/10/2019 0128   PHURINE 6.0 09/10/2019 0128   GLUCOSEU 250 (A) 09/10/2019 0128   HGBUR LARGE (A) 09/10/2019 0128   HGBUR negative 09/14/2009 1428   BILIRUBINUR NEGATIVE 09/10/2019 0128   KETONESUR NEGATIVE 09/10/2019 0128   PROTEINUR >300 (A) 09/10/2019 0128    UROBILINOGEN 1.0 10/27/2013 2022   NITRITE NEGATIVE 09/10/2019 0128   LEUKOCYTESUR LARGE (A) 09/10/2019 0128    Sepsis Labs: Lactic Acid, Venous    Component Value Date/Time   LATICACIDVEN 1.2 10/04/2021 1235    MICROBIOLOGY: Recent Results (from the past 240 hour(s))  Culture, blood (Routine X 2) w Reflex to ID Panel     Status: None   Collection Time: 10/27/21 10:33 AM   Specimen: BLOOD RIGHT HAND  Result Value Ref Range Status   Specimen Description BLOOD RIGHT HAND  Final   Special Requests   Final    BOTTLES DRAWN AEROBIC AND ANAEROBIC Blood Culture adequate volume   Culture   Final    NO GROWTH 5 DAYS Performed at [facility], [address], [place], [place] [address]    Report Status 11/01/2021 FINAL  Final  Culture, blood (Routine X 2) w Reflex to ID Panel     Status: None   Collection Time: 10/27/21 10:38 AM  Specimen: BLOOD RIGHT HAND  Result Value Ref Range Status   Specimen Description BLOOD RIGHT HAND  Final   Special Requests   Final    BOTTLES DRAWN AEROBIC AND ANAEROBIC Blood Culture adequate volume   Culture   Final    NO GROWTH 5 DAYS Performed at [facility], [address] [place], [place] [address]    Report Status 11/01/2021 FINAL  Final    RADIOLOGY STUDIES/RESULTS: DG CHEST PORT 1 VIEW  Result Date: 10/31/2021 CLINICAL DATA:  Shortness of breath EXAM: PORTABLE CHEST 1 VIEW COMPARISON:  Chest radiograph 1 day prior, CT chest 10/29/2021 FINDINGS: The tracheostomy tube is stable. The enteric catheter tip is off the field of view. Heart is enlarged with prominent upper mediastinal contours, unchanged. Lung volumes are low. There is no new or worsening focal airspace disease. There is no significant pleural effusion. There is no appreciable pneumothorax The bones are stable. IMPRESSION: Low lung volumes. Otherwise, no radiographic evidence of acute cardiopulmonary process. No significant interval change since the study from 1 day prior.  Electronically Signed   By: Lesia Hausen M.D.   On: 10/31/2021 08:21   DG Abd Portable 1V  Result Date: 10/31/2021 CLINICAL DATA:  NG tube placement EXAM: PORTABLE ABDOMEN - 1 VIEW COMPARISON:  None. FINDINGS: Gastric tube with tip in the gastric antrum. IMPRESSION: Gastric tube with tip in the gastric antrum Electronically Signed   By: Genevive Bi M.D.   On: 10/31/2021 15:00   VAS [place] LOWER EXTREMITY VENOUS (DVT)  Result Date: 10/31/2021  Lower Venous DVT Study Patient Name:  ALONDRA USEY  Date of Exam:   10/31/2021 Medical Rec #: [id number]      Accession #:    [id number] Date of Birth: June 20, 1971      Patient Gender: F Patient Age:   17 years Exam Location:  [facility] Procedure:      VAS [place] LOWER EXTREMITY VENOUS (DVT) Referring Phys: Marguerita Merles --------------------------------------------------------------------------------  Indications: Fever unknown origin.  Limitations: Patient unable to cooperate with repositioning, patient guarding, persistent coughing. Comparison Study: No prior studies. Performing Technologist: Jean Rosenthal RDMS, RVT  Examination Guidelines: A complete evaluation includes B-mode imaging, spectral Doppler, color Doppler, and power Doppler as needed of all accessible portions of each vessel. Bilateral testing is considered an integral part of a complete examination. Limited examinations for reoccurring indications may be performed as noted. The reflux portion of the exam is performed with the patient in reverse Trendelenburg.  +---------+---------------+---------+-----------+----------+--------------+ RIGHT    CompressibilityPhasicitySpontaneityPropertiesThrombus Aging +---------+---------------+---------+-----------+----------+--------------+ CFV      Full           Yes      Yes                                 +---------+---------------+---------+-----------+----------+--------------+ SFJ      Full                                                         +---------+---------------+---------+-----------+----------+--------------+ FV Prox  Full                                                        +---------+---------------+---------+-----------+----------+--------------+  FV Mid   Full                                                        +---------+---------------+---------+-----------+----------+--------------+ FV DistalFull                                                        +---------+---------------+---------+-----------+----------+--------------+ PFV      Full                                                        +---------+---------------+---------+-----------+----------+--------------+ POP      Full           Yes      Yes                                 +---------+---------------+---------+-----------+----------+--------------+ PTV      Full                                                        +---------+---------------+---------+-----------+----------+--------------+ PERO     Full                                                        +---------+---------------+---------+-----------+----------+--------------+   +---------+---------------+---------+-----------+----------+--------------+ LEFT     CompressibilityPhasicitySpontaneityPropertiesThrombus Aging +---------+---------------+---------+-----------+----------+--------------+ CFV      Full           Yes      Yes                                 +---------+---------------+---------+-----------+----------+--------------+ SFJ      Full                                                        +---------+---------------+---------+-----------+----------+--------------+ FV Prox  Full                                                        +---------+---------------+---------+-----------+----------+--------------+ FV Mid   Full                                                         +---------+---------------+---------+-----------+----------+--------------+  FV DistalFull                                                        +---------+---------------+---------+-----------+----------+--------------+ PFV      Full                                                        +---------+---------------+---------+-----------+----------+--------------+ POP                     Yes      Yes                                 +---------+---------------+---------+-----------+----------+--------------+ PTV      Full                                                        +---------+---------------+---------+-----------+----------+--------------+ PERO     Full                                                        +---------+---------------+---------+-----------+----------+--------------+     Summary: RIGHT: - There is no evidence of deep vein thrombosis in the lower extremity.  - No cystic structure found in the popliteal fossa.  LEFT: - There is no evidence of deep vein thrombosis in the lower extremity. However, portions of this examination were limited- see technologist comments above.  - No cystic structure found in the popliteal fossa.  *See table(s) above for measurements and observations.    Preliminary    VAS [place] UPPER EXTREMITY VENOUS DUPLEX  Result Date: 10/31/2021 UPPER VENOUS STUDY  Patient Name:  EMMAMARIE KNEIPP  Date of Exam:   10/31/2021 Medical Rec #: [id number]      Accession #:    [id number] Date of Birth: 03-Mar-1971      Patient Gender: F Patient Age:   67 years Exam [facility]:  [facility] Procedure:      VAS [place] UPPER EXTREMITY VENOUS DUPLEX Referring Phys: Marguerita Merles --------------------------------------------------------------------------------  Indications: Fever of unknown origin Limitations: Patient positioning/guarding, neck collar, left arm bandaging. Comparison Study: No prior studies. Performing Technologist: Jean Rosenthal RDMS, RVT   Examination Guidelines: A complete evaluation includes B-mode imaging, spectral Doppler, color Doppler, and power Doppler as needed of all accessible portions of each vessel. Bilateral testing is considered an integral part of a complete examination. Limited examinations for reoccurring indications may be performed as noted.  Right Findings: +----------+------------+---------+-----------+----------+-------+ RIGHT     CompressiblePhasicitySpontaneousPropertiesSummary +----------+------------+---------+-----------+----------+-------+ Subclavian    Full       Yes       Yes                      +----------+------------+---------+-----------+----------+-------+ Axillary  Full                                          +----------+------------+---------+-----------+----------+-------+ Brachial      Full                                          +----------+------------+---------+-----------+----------+-------+ Radial        Full                                          +----------+------------+---------+-----------+----------+-------+ Ulnar         Full                                          +----------+------------+---------+-----------+----------+-------+ Cephalic      Full                                          +----------+------------+---------+-----------+----------+-------+ Basilic       Full                                          +----------+------------+---------+-----------+----------+-------+  Left Findings: +----------+------------+---------+-----------+----------+---------------------+ LEFT      CompressiblePhasicitySpontaneousProperties       Summary        +----------+------------+---------+-----------+----------+---------------------+ Subclavian               Yes       Yes                                    +----------+------------+---------+-----------+----------+---------------------+ Axillary      Full                                                         +----------+------------+---------+-----------+----------+---------------------+ Brachial      Full                                    Some segments not                                                         visualized due to  bandaging       +----------+------------+---------+-----------+----------+---------------------+ Radial        Full                                                        +----------+------------+---------+-----------+----------+---------------------+ Ulnar         Full                                                        +----------+------------+---------+-----------+----------+---------------------+ Cephalic                 Yes       Yes                   Patent AVF       +----------+------------+---------+-----------+----------+---------------------+ Basilic                                                Not visualized     +----------+------------+---------+-----------+----------+---------------------+  Summary:  Right: No evidence of deep vein thrombosis in the upper extremity. No evidence of superficial vein thrombosis in the upper extremity.  Left: No evidence of deep vein thrombosis in the upper extremity. No evidence of superficial vein thrombosis in the upper extremity. However, unable to visualize the basilic vein.  *See table(s) above for measurements and observations.    Preliminary      LOS: 28 days   Jeoffrey Massed, MD  [facility]    To contact the attending provider between 7A-7P or the covering provider during after hours 7P-7A, please log into the web site [email or web address] and access using universal [place] password for that web site. If you do not have the password, please call the hospital operator.  11/01/2021, 11:36 AM

## 2021-11-02 LAB — GLUCOSE, CAPILLARY
Glucose-Capillary: 100 mg/dL — ABNORMAL HIGH (ref 70–99)
Glucose-Capillary: 148 mg/dL — ABNORMAL HIGH (ref 70–99)

## 2021-11-03 LAB — HM DIABETES EYE EXAM

## 2021-11-06 NOTE — Death Summary Note (Addendum)
? ?DEATH SUMMARY  ? ?Patient Details  ?Name: Melanie Hall ?MRN: [id number] ?DOB: 1971-06-05 ?YOV:ZCHYIF, Dewaine Conger, FNP ?Admission/Discharge Information  ? ?Admit Date:  2021-10-13  ?Date of Death: Date of Death: 2021-11-11  ?Time of Death: Time of Death: 0726  ?Length of Stay: 29  ? ?Principle Cause of death:  ? ?Hospital Diagnoses: ?Principal Problem: ?  Altered mental status ?Active Problems: ?  Pressure injury of skin ?  PRES (posterior reversible encephalopathy syndrome) ?  ESRD (end stage renal disease) ([place]) ?  Acute respiratory failure ([place]) ?  Secondary hypertension ?  Type 2 diabetes mellitus with hyperglycemia (HCC) ?  Goals of care, counseling/discussion ?  Acute encephalopathy ? ? ?Hospital Course: ?51 year old with history of ESRD on HD, DM-2, HTN, HLD, CVA-who was found by home health unresponsive-with questionable seizure-like activity-she was evaluated at [facility] ED-where she was intubated and transferred to [facility] for further evaluation.  Initial MRI on Oct 14, 2022 was consistent with PRES syndrome-EEG was negative for seizures-unfortunately-even with supportive care-initiation of antiepileptic agents-she remains persistently encephalopathic.  Due to inability to be liberated from the ventilator-she required a tracheotomy on 4/15.  See below for further details.  ?  ?Significant events: ?3/29>> unresponsive-intubated at [facility] ED-transfer to [facility]. ?4/03>> off sedation-still unresponsive. ?4/11>> failed extubation-reintubated-bedside tracheotomy attempted but unsuccessful. ?4/15>> tracheotomy performed by ENT ?4/20>> transfer to [facility]. ?4/27>> severe sudden bradycardia-subsequently into PEA arrest. ?  ?Significant studies: ?4/25>> Doppler bilateral lower extremity: No DVT ?4/25>> Doppler bilateral upper extremity: No DVT ?4/23>> CT chest/abdomen: No acute abnormalities, unchanged splenomegaly-right upper/moderate bilateral lower lobe atelectasis. ?4/12>> CT soft tissue neck: No masses/swelling evident. ?4/12>> Echo: EF  02-77%, grade 1 diastolic dysfunction. ?4/08>> MRI brain: No acute intracranial abnormality ?3/30>> MRI brain: Findings consistent with posterior reversible encephalopathy syndrome. ?3/30>> CTA head/neck: No large LVO, 40% stenosis left ICA.  Severe ostial stenosis at origin of right vertebral artery. ?3/30>> CT venogram: No evidence of dural venous sinus thrombosis ?  ?Significant microbiology data: ?3/29>> COVID/influenza PCR: Negative ?3/29>> blood culture: Negative ?4/04>> tracheal aspirate culture: Negative ?4/10>> tracheal aspirate culture: Negative ?4/21>> blood culture: Negative ?  ?Procedures: ?3/29>> EEG: Negative for seizures. ?4/07>> EEG: Negative for seizures ?4/15>> tracheotomy ?  ?Consults: ?PCCM, neurology, nephrology, palliative care ? ? ?Assessment and Plan: ?Persistent encephalopathy: Unclear etiology-remained persistently encephalopathic in spite of being on AEDs and radiographic resolution of PRES syndrome on repeat MRI brain on 4/8.  Per neurology note-patient has been off sedation for 3 weeks.  Concern for anoxic injury in the setting of possible status epilepticus with PRES syndrome or even a possible hypoglycemic episode at home.  Neurology followed closely-unfortunately her prognosis was felt to be very poor-and it was felt that she unlikely will recover to her prior level of functioning.  Palliative care followed closely-she was a DNR-goals of care were ongoing-unfortunately early this morning-patient had a sudden episode of severe bradycardia and subsequently went into a PEA arrest.  This MD called her daughter-and informed her of the above-later this morning-numerous family members were at bedside-condolence was provided. ?  ?Possible status epilepticus : Likely provoked by PRES.  Neurology followed closely during this hospitalization.  Remains on Vimpat with plans to taper off after June.   ?  ?PRES: Resolved-on repeat MRI on 4/8.  ?  ?Acute hypoxic respiratory failure due to aspiration  pneumonia-s/p failure to wean off ventilator-now with tracheotomy: Trach care was continued-had completed a course of antimicrobial therapy.   ?  ?Fever: Did have some fever over  the past few days-extensive work-up including repeat cultures/imaging studies were negative.  No DVT was seen in the upper/lower extremities.  Thankfully no fever for the past 48 hours-there was some concern that this could be central fever in the setting of anoxic injury.   ?  ?ESRD on HD: followed closely by nephrology. ?  ?Normocytic anemia: Multifactorial in the setting of ESRD-acute illness.  Had approximately  ?  ?Chronic HFrEF: Volume status stable-volume removal was planned with HD. ?  ?History of CAD s/p PCI 2021: No obvious anginal symptoms-was on ASA/statin/Coreg ?  ?DM-2 (A1c 10.5 on 3/15): CBG stable-contained on Levemir and NovoLog. ? ?Hypothyroidism: She was maintained on Synthroid-TSH was stable. ?  ?Dysphagia: Due to severe encephalopathy-cortak was in place-goals of care discussions was ongoing-family was still debating whether or not to pursue PEG tube placement.   ?  ?Palliative care: Unfortunate 51 year old with ESRD/ischemic cardiomyopathy/ CAD s/p PCI 2021 who initially presented unresponsive-thought to have possible status epilepticus in the setting of posterior reversible encephalopathy syndrome.  In spite of maximal supportive care-initiation of antiepileptics-she continues to be encephalopathic.  Per neurology-poor prognosis-likely will not get back to her prior level of functioning.  Palliative care followed closely-goals of care discussion was ongoing with patient's family-patient was already a DNR-as noted above-on 4/27-patient had a sudden episode of bradycardia and subsequently went into PEA arrest. ?  ?Nutrition Status: ?Nutrition Problem: Inadequate oral intake ?Etiology: inability to eat ?Signs/Symptoms: NPO status ?Interventions: MVI, Tube feeding, Prostat ? ?Obesity: ?Estimated body mass index is 30.66  kg/m? as calculated from the following: ?  Height as of this encounter: '5\' 3"'$  (1.6 m). ?  Weight as of this encounter: 78.5 kg.  ?  ? ?The results of significant diagnostics from this hospitalization (including imaging, microbiology, ancillary and laboratory) are listed below for reference.  ? ?Significant Diagnostic Studies: ?DG Abd 1 View ? ?Result Date: 10/22/2021 ?CLINICAL DATA:  Confirm NG tube placement. EXAM: ABDOMEN - 1 VIEW COMPARISON:  10/20/2021. FINDINGS: The bowel gas pattern is normal. The distal tip of the enteric tube terminates in the region of the distal stomach. No radio-opaque calculi. The cardiac silhouette is enlarged. IMPRESSION: The enteric tube terminates in the region of the distal stomach. Electronically Signed   By: Brett Fairy M.D.   On: 10/22/2021 00:38  ? ?DG Abd 1 View ? ?Result Date: 10/17/2021 ?CLINICAL DATA:  Enteric tube placement. EXAM: ABDOMEN - 1 VIEW COMPARISON:  Abdominal x-ray dated October 04, 2021. FINDINGS: Enteric tube appropriately positioned within the stomach. The bowel gas pattern is normal. No radio-opaque calculi or other significant radiographic abnormality are seen. IMPRESSION: 1. Enteric tube in the stomach. Electronically Signed   By: Titus Dubin M.D.   On: 10/17/2021 12:41  ? ?CT SOFT TISSUE NECK W CONTRAST ? ?Result Date: 10/18/2021 ?CLINICAL DATA:  Provided history: Aborted bedside percutaneous tracheostomy, evaluate anatomy. EXAM: CT NECK WITH CONTRAST TECHNIQUE: Multidetector CT imaging of the neck was performed using the standard protocol following the bolus administration of intravenous contrast. RADIATION DOSE REDUCTION: This exam was performed according to the departmental dose-optimization program which includes automated exposure control, adjustment of the mA and/or kV according to patient size and/or use of iterative reconstruction technique. CONTRAST:  44m OMNIPAQUE IOHEXOL 300 MG/ML  SOLN COMPARISON:  Neck CT 06/27/2008.  CT angiogram head/neck  10/05/2021. FINDINGS: Incompletely imaged endotracheal and left nasoenteric tubes. Pharynx and larynx: The presence of secretions and life-support tubes within the oral cavity, pharynx and larynx limi

## 2021-11-06 DEATH — deceased

## 2021-11-10 ENCOUNTER — Encounter: Payer: Medicaid Other | Admitting: Physical Medicine & Rehabilitation

## 2021-11-10 ENCOUNTER — Ambulatory Visit: Payer: Medicaid Other | Admitting: Physical Medicine & Rehabilitation

## 2021-12-21 ENCOUNTER — Ambulatory Visit: Payer: Medicaid Other | Admitting: Nurse Practitioner

## 2021-12-22 ENCOUNTER — Ambulatory Visit: Payer: Medicaid Other | Admitting: Nurse Practitioner

## 2022-07-30 ENCOUNTER — Ambulatory Visit (INDEPENDENT_AMBULATORY_CARE_PROVIDER_SITE_OTHER): Payer: Medicaid Other | Admitting: Gastroenterology
